# Patient Record
Sex: Male | Born: 1940
Health system: Southern US, Community
[De-identification: ages and names within clinical notes are randomized; demographics above are authoritative.]

## PROBLEM LIST (undated history)

## (undated) DIAGNOSIS — N2581 Secondary hyperparathyroidism of renal origin: Secondary | ICD-10-CM

## (undated) DIAGNOSIS — K649 Unspecified hemorrhoids: Secondary | ICD-10-CM

## (undated) DIAGNOSIS — Z860100 Personal history of colon polyps, unspecified: Secondary | ICD-10-CM

## (undated) DIAGNOSIS — S88119A Complete traumatic amputation at level between knee and ankle, unspecified lower leg, initial encounter: Secondary | ICD-10-CM

## (undated) DIAGNOSIS — R269 Unspecified abnormalities of gait and mobility: Secondary | ICD-10-CM

## (undated) DIAGNOSIS — N400 Enlarged prostate without lower urinary tract symptoms: Secondary | ICD-10-CM

## (undated) DIAGNOSIS — T4145XA Adverse effect of unspecified anesthetic, initial encounter: Secondary | ICD-10-CM

## (undated) DIAGNOSIS — D649 Anemia, unspecified: Secondary | ICD-10-CM

## (undated) DIAGNOSIS — K5900 Constipation, unspecified: Secondary | ICD-10-CM

## (undated) DIAGNOSIS — I96 Gangrene, not elsewhere classified: Secondary | ICD-10-CM

## (undated) DIAGNOSIS — E119 Type 2 diabetes mellitus without complications: Secondary | ICD-10-CM

## (undated) DIAGNOSIS — C61 Malignant neoplasm of prostate: Secondary | ICD-10-CM

## (undated) DIAGNOSIS — M949 Disorder of cartilage, unspecified: Secondary | ICD-10-CM

## (undated) DIAGNOSIS — I255 Ischemic cardiomyopathy: Secondary | ICD-10-CM

## (undated) DIAGNOSIS — T8859XA Other complications of anesthesia, initial encounter: Secondary | ICD-10-CM

## (undated) DIAGNOSIS — I219 Acute myocardial infarction, unspecified: Secondary | ICD-10-CM

## (undated) DIAGNOSIS — N186 End stage renal disease: Secondary | ICD-10-CM

## (undated) DIAGNOSIS — E1142 Type 2 diabetes mellitus with diabetic polyneuropathy: Secondary | ICD-10-CM

## (undated) DIAGNOSIS — I5042 Chronic combined systolic (congestive) and diastolic (congestive) heart failure: Secondary | ICD-10-CM

## (undated) DIAGNOSIS — K6381 Dieulafoy lesion of intestine: Secondary | ICD-10-CM

## (undated) DIAGNOSIS — G709 Myoneural disorder, unspecified: Secondary | ICD-10-CM

## (undated) DIAGNOSIS — I82409 Acute embolism and thrombosis of unspecified deep veins of unspecified lower extremity: Secondary | ICD-10-CM

## (undated) DIAGNOSIS — I214 Non-ST elevation (NSTEMI) myocardial infarction: Secondary | ICD-10-CM

## (undated) DIAGNOSIS — I10 Essential (primary) hypertension: Secondary | ICD-10-CM

## (undated) DIAGNOSIS — N200 Calculus of kidney: Secondary | ICD-10-CM

## (undated) DIAGNOSIS — E785 Hyperlipidemia, unspecified: Secondary | ICD-10-CM

## (undated) DIAGNOSIS — I739 Peripheral vascular disease, unspecified: Secondary | ICD-10-CM

## (undated) DIAGNOSIS — Z992 Dependence on renal dialysis: Secondary | ICD-10-CM

## (undated) DIAGNOSIS — M899 Disorder of bone, unspecified: Secondary | ICD-10-CM

## (undated) DIAGNOSIS — I480 Paroxysmal atrial fibrillation: Secondary | ICD-10-CM

## (undated) DIAGNOSIS — I2510 Atherosclerotic heart disease of native coronary artery without angina pectoris: Secondary | ICD-10-CM

## (undated) DIAGNOSIS — G629 Polyneuropathy, unspecified: Secondary | ICD-10-CM

## (undated) HISTORY — PX: COLONOSCOPY: SHX174

## (undated) HISTORY — DX: Benign prostatic hyperplasia without lower urinary tract symptoms: N40.0

## (undated) HISTORY — DX: Complete traumatic amputation at level between knee and ankle, unspecified lower leg, initial encounter: S88.119A

## (undated) HISTORY — DX: Unspecified abnormalities of gait and mobility: R26.9

## (undated) HISTORY — DX: Disorder of bone, unspecified: M89.9

## (undated) HISTORY — DX: Disorder of cartilage, unspecified: M94.9

---

## 1962-01-11 HISTORY — PX: KNEE CARTILAGE SURGERY: SHX688

## 1999-02-18 ENCOUNTER — Emergency Department (HOSPITAL_COMMUNITY): Admission: EM | Admit: 1999-02-18 | Discharge: 1999-02-18 | Payer: Self-pay | Admitting: Emergency Medicine

## 2000-09-25 ENCOUNTER — Emergency Department (HOSPITAL_COMMUNITY): Admission: EM | Admit: 2000-09-25 | Discharge: 2000-09-25 | Payer: Self-pay | Admitting: Emergency Medicine

## 2001-02-17 ENCOUNTER — Encounter: Payer: Self-pay | Admitting: Emergency Medicine

## 2001-02-17 ENCOUNTER — Emergency Department (HOSPITAL_COMMUNITY): Admission: EM | Admit: 2001-02-17 | Discharge: 2001-02-17 | Payer: Self-pay | Admitting: Emergency Medicine

## 2003-01-12 DIAGNOSIS — I219 Acute myocardial infarction, unspecified: Secondary | ICD-10-CM

## 2003-01-12 HISTORY — PX: CORONARY ARTERY BYPASS GRAFT: SHX141

## 2003-01-12 HISTORY — DX: Acute myocardial infarction, unspecified: I21.9

## 2003-01-12 HISTORY — PX: CAROTID ENDARTERECTOMY: SUR193

## 2004-12-25 ENCOUNTER — Emergency Department (HOSPITAL_COMMUNITY): Admission: EM | Admit: 2004-12-25 | Discharge: 2004-12-25 | Payer: Self-pay | Admitting: Emergency Medicine

## 2005-04-11 ENCOUNTER — Inpatient Hospital Stay (HOSPITAL_COMMUNITY): Admission: EM | Admit: 2005-04-11 | Discharge: 2005-04-22 | Payer: Self-pay | Admitting: Emergency Medicine

## 2005-04-11 ENCOUNTER — Ambulatory Visit: Payer: Self-pay | Admitting: Internal Medicine

## 2005-04-12 ENCOUNTER — Encounter (INDEPENDENT_AMBULATORY_CARE_PROVIDER_SITE_OTHER): Payer: Self-pay | Admitting: Cardiovascular Disease

## 2005-04-12 ENCOUNTER — Encounter: Payer: Self-pay | Admitting: Vascular Surgery

## 2005-04-16 ENCOUNTER — Encounter (INDEPENDENT_AMBULATORY_CARE_PROVIDER_SITE_OTHER): Payer: Self-pay | Admitting: *Deleted

## 2005-05-20 ENCOUNTER — Encounter (HOSPITAL_COMMUNITY): Admission: RE | Admit: 2005-05-20 | Discharge: 2005-08-18 | Payer: Self-pay | Admitting: *Deleted

## 2005-05-28 ENCOUNTER — Encounter: Admission: RE | Admit: 2005-05-28 | Discharge: 2005-05-28 | Payer: Self-pay | Admitting: Cardiothoracic Surgery

## 2005-08-19 ENCOUNTER — Encounter (HOSPITAL_COMMUNITY): Admission: RE | Admit: 2005-08-19 | Discharge: 2005-09-21 | Payer: Self-pay | Admitting: *Deleted

## 2007-01-12 HISTORY — PX: PROSTATECTOMY: SHX69

## 2008-01-12 DIAGNOSIS — C61 Malignant neoplasm of prostate: Secondary | ICD-10-CM

## 2008-01-12 HISTORY — DX: Malignant neoplasm of prostate: C61

## 2009-01-11 HISTORY — PX: CATARACT EXTRACTION W/ INTRAOCULAR LENS  IMPLANT, BILATERAL: SHX1307

## 2009-08-19 ENCOUNTER — Emergency Department (HOSPITAL_COMMUNITY): Admission: EM | Admit: 2009-08-19 | Discharge: 2009-08-19 | Payer: Self-pay | Admitting: Emergency Medicine

## 2009-08-19 ENCOUNTER — Encounter (INDEPENDENT_AMBULATORY_CARE_PROVIDER_SITE_OTHER): Payer: Self-pay | Admitting: Emergency Medicine

## 2009-08-19 ENCOUNTER — Ambulatory Visit: Payer: Self-pay | Admitting: Vascular Surgery

## 2010-02-01 ENCOUNTER — Encounter: Payer: Self-pay | Admitting: Cardiothoracic Surgery

## 2010-03-27 LAB — BASIC METABOLIC PANEL
CO2: 30 mEq/L (ref 19–32)
Calcium: 9.4 mg/dL (ref 8.4–10.5)
Chloride: 99 mEq/L (ref 96–112)
GFR calc non Af Amer: 42 mL/min — ABNORMAL LOW (ref >60)
Glucose, Bld: 356 mg/dL — ABNORMAL HIGH (ref 70–99)
Sodium: 137 mEq/L (ref 135–145)

## 2010-03-27 LAB — GLUCOSE, CAPILLARY: Glucose-Capillary: 335 mg/dL — ABNORMAL HIGH (ref 70–99)

## 2010-03-27 LAB — CBC
MCH: 30.1 pg (ref 26.0–34.0)
RBC: 3.41 MIL/uL — ABNORMAL LOW (ref 4.22–5.81)
WBC: 7.7 10*3/uL (ref 4.0–10.5)

## 2010-05-05 ENCOUNTER — Emergency Department (HOSPITAL_COMMUNITY)
Admission: EM | Admit: 2010-05-05 | Discharge: 2010-05-05 | Disposition: A | Discharge status: Home / Self Care | Payer: Medicare Other | Attending: Emergency Medicine | Admitting: Emergency Medicine

## 2010-05-05 ENCOUNTER — Emergency Department (HOSPITAL_COMMUNITY): Payer: Medicare Other

## 2010-05-05 DIAGNOSIS — E7800 Pure hypercholesterolemia, unspecified: Secondary | ICD-10-CM | POA: Insufficient documentation

## 2010-05-05 DIAGNOSIS — R112 Nausea with vomiting, unspecified: Secondary | ICD-10-CM | POA: Insufficient documentation

## 2010-05-05 DIAGNOSIS — R197 Diarrhea, unspecified: Secondary | ICD-10-CM | POA: Insufficient documentation

## 2010-05-05 DIAGNOSIS — E119 Type 2 diabetes mellitus without complications: Secondary | ICD-10-CM | POA: Insufficient documentation

## 2010-05-05 DIAGNOSIS — I10 Essential (primary) hypertension: Secondary | ICD-10-CM | POA: Insufficient documentation

## 2010-05-05 LAB — URINALYSIS, ROUTINE W REFLEX MICROSCOPIC
Bilirubin Urine: NEGATIVE
Glucose, UA: >1000 mg/dL — AB
Ketones, ur: NEGATIVE mg/dL
Protein, ur: >300 mg/dL — AB
Specific Gravity, Urine: 1.021 (ref 1.005–1.030)
pH: 6 (ref 5.0–8.0)

## 2010-05-05 LAB — CBC
MCH: 30.2 pg (ref 26.0–34.0)
MCHC: 33.5 g/dL (ref 30.0–36.0)
RBC: 4.57 MIL/uL (ref 4.22–5.81)
WBC: 7.4 10*3/uL (ref 4.0–10.5)

## 2010-05-05 LAB — BASIC METABOLIC PANEL
Calcium: 9.6 mg/dL (ref 8.4–10.5)
Chloride: 98 mEq/L (ref 96–112)
Creatinine, Ser: 1.96 mg/dL — ABNORMAL HIGH (ref 0.4–1.5)
GFR calc non Af Amer: 34 mL/min — ABNORMAL LOW (ref >60)

## 2010-05-05 LAB — DIFFERENTIAL
Eosinophils Absolute: 0.1 10*3/uL (ref 0.0–0.7)
Eosinophils Relative: 1 % (ref 0–5)
Lymphocytes Relative: 26 % (ref 12–46)
Lymphs Abs: 1.9 10*3/uL (ref 0.7–4.0)
Monocytes Absolute: 0.5 10*3/uL (ref 0.1–1.0)
Neutro Abs: 4.8 10*3/uL (ref 1.7–7.7)
Neutrophils Relative %: 65 % (ref 43–77)

## 2010-05-29 NOTE — Consult Note (Signed)
Aaron Long, Aaron Long NO.:  [id number]   MEDICAL RECORD NO.:  [id number]          PATIENT TYPE:  INP   LOCATION:  3315                         FACILITY:  [facility]   PHYSICIAN:  Kerin Perna, M.D.  DATE OF BIRTH:  04/12/1940   DATE OF CONSULTATION:  04/12/2005  DATE OF DISCHARGE:                                   CONSULTATION   PHYSICIAN REQUESTING CONSULTATION:  Darlin Priestly, M.D.   PRIMARY PHYSICIAN:  [facility].   REASON FOR CONSULTATION:  Severe three-vessel coronary disease with  subendocardial MI and class IV unstable angina.   CHIEF COMPLAINT:  Chest pain.   HISTORY OF PRESENT ILLNESS:  I was asked to evaluate this 70 year old  African-American male for potential surgical coronary revascularization for  recently diagnosed severe three-vessel coronary artery disease.  The patient  has no history of prior heart disease or cardiac catheterization.  He is  admitted on April 11, 2005 with a four-hour duration of substernal chest pain  radiating to the shoulders not relieved by rest.  It was precipitated by a  large meal and had some associated upper epigastric discomfort, nausea  without vomiting.  The patient was found to have nonspecific EKG changes  when he presented to the emergency department.  He had evidence of left  ventricular hypertrophy.  Cardiac enzymes were checked and noted to be  positive.  His peak CPK-MB was 58 ng/mL.  He was placed on heparin and  scheduled for cardiac catheterization.  This was performed today by Dr.  Lenise Herald.  Left ventriculogram showed EF of approximately 45%.  Left  ventricular end-diastolic pressure was 21 mmHg.  He had severe three-vessel  coronary disease.  The right coronary had a 99% stenosis.  The LAD diagonal  had an 80% stenosis.  The circumflex had a proximal 95% stenosis.  The  patient was not felt to be candidate for percutaneous intervention and  surgical coronary  revascularization was recommended by the patient's  cardiologist.   PAST MEDICAL HISTORY:  1.  Type 2 diabetes mellitus, insulin dependent.  2.  Hypertension.  3.  Hypothyroidism.  4.  Hyperlipidemia.  5.  Elevated creatinine 1.7.   HOME MEDICATIONS:  Aspirin, Lipitor 40 mg, lisinopril 10 mg, Neurontin 300  mg b.i.d., Lantus insulin 50 units q.h.s., Amaryl 8 mg daily.   ALLERGIES:  None.   SOCIAL HISTORY:  The patient is retired from working for the [employer].  He lives in [place] with his wife.  He does  not smoke cigarettes or use alcohol.   FAMILY HISTORY:  Positive for diabetes and hypertension.  Negative for  premature coronary artery disease.  Negative for bypass surgery.   REVIEW OF SYSTEMS:  Constitutional review is negative fever, weight loss.  He is currently 205 pounds which is his stable weight.  HEENT review  indicates he has had total upper dental extractions.  He has no active  dental complaints.  He denies any difficulty swallowing.  Thoracic review is  negative for trauma, abnormal chest x-ray with pulmonary nodule,  a  productive cough or hemoptysis or history of pneumonia.  Cardiac review is  positive for his class IV unstable angina with resting angina and myocardial  infarction.  He has no long history of cardiac murmur, arrhythmia or  myocardial infarction.  GI review is significant for upper abdominal  discomfort on presentation and some abdominal distension noted on his  initial exam.  Ultrasound of the abdomen has been negative and a mildly  elevated amylase has subsequently turned to normal level.  Neurologic is  negative for BPH or hematuria.  Endocrine review is positive thyroid disease  and diabetes mellitus and hematologic review is negative for bleeding  disorder.  Neurologic is negative for stroke or seizure.  Vascular review is  negative DVT, claudication or TIA.   PHYSICAL EXAMINATION:  The patient is 5 foot 9  and weighs 205 pounds.  Blood  pressure 130/60, pulse 70 and sinus, respirations 18, saturation 98% on room  air.  General appearance is that of a middle-aged African-American male in  his hospital room and following cardiac catheterization in no acute  distress.  HEENT exam is normocephalic, full EOMs, dentition adequate.  Neck  is without JVD, mass or carotid bruit.  Lymphatics reveal no palpable  supraclavicular or axillary adenopathy.  His lungs are clear and there is no  thoracic deformity.  Cardiac exam reveals a regular rhythm without S3 gallop  or murmur.  Abdominal exam is nontender but mildly distended.  There is no  organomegaly or pulsatile mass.  Extremities reveal no clubbing, cyanosis or  edema.  Peripheral pulses are 2+ in the upper extremities.  He has a  compression dressing in the right groin.  He has a 1 to 2+ right pedal pulse  and a nonpalpable left pedal pulse.  His neurologic exam is alert and  oriented without focal motor deficit.   LABORATORY DATA:  I reviewed the coronary arteriograms and his medical  records.  He has severe three-vessel coronary disease with mild to moderate  reduction in LV function and significant diabetes with a hemoglobin A1c of  13.1.  His creatinine is elevated at 1.7 and his chest x-ray shows no active  disease.   IMPRESSION/PLAN:  The patient would benefit from surgical revascularization  with bypass grafts to the left anterior descending artery, diagonal, obtuse  marginal, and distal right coronary artery.  I have reviewed the situation  and the proposed operation with the patient and at this point he is willing  to proceed.  We will tentatively schedule his surgery for April 15, 2005 at 7  a.m.  Thank you for the consultation.      Kerin Perna, M.D.  Electronically Signed     PV/MEDQ  D:  04/12/2005  T:  04/12/2005  Job:  [id number]

## 2010-05-29 NOTE — Op Note (Signed)
NAMERIDER, ERMIS NO.:  [id number]   MEDICAL RECORD NO.:  [id number]            PATIENT TYPE:   LOCATION:                                 FACILITY:   PHYSICIAN:  Kerin Perna, M.D.       DATE OF BIRTH:   DATE OF PROCEDURE:  04/15/2005  DATE OF DISCHARGE:                                 OPERATIVE REPORT   OPERATION:  Coronary artery bypass grafting x4 (left internal mammary artery  to LAD, saphenous vein graft to optional diagonal, saphenous vein graft to  the obtuse marginal, saphenous vein graft to distal RCA).   PRE-AND-POSTOPERATIVE DIAGNOSIS:  Class IV unstable angina with severe 3-  vessel coronary artery disease.   SURGEON:  Kerin Perna, M.D.   ASSISTANT:  Constance Holster, PA-C   ANESTHESIA:  General by Kaylyn Layer Michelle Piper, M.D.   INDICATIONS:  The patient is a 70 year old diabetic hypertensive male who  presented with symptoms of unstable angina.  He was also noted to have  mildly positive cardiac enzymes and a cardiology consultation was obtained.  Cardiac catheterization was recommended and this demonstrated severe 3-  vessel coronary artery disease.  The patient was felt to be a candidate for  surgical revascularization.  I examined the patient, on at least 2 occasions  in the hospital room, reviewed the results of his cardiac catheterization  and coronary arteriograms; and discussed the indications, benefits, and  alternatives to surgical revascularization for treatment of his coronary  artery disease.  I reviewed with the patient and his family the main aspects  of the procedure; including the choice of conduit, to include internal  mammary artery and endoscopically harvested saphenous vein; the location of  the surgical incisions; the use of general anesthesia and cardiopulmonary  bypass; and the expected postoperative hospital recovery.  I also discussed,  with the patient, the risks to him of coronary bypass surgery including the  risks  of MI, CVA, bleeding, transfusion requirements, and death.  He  understood these implications for the surgery, and agreed to proceed with  the operation as planned; under what, I felt, was an informed consent.   OPERATIVE FINDINGS:  [name] of coronaries were small consistent with  his diabetic history, but graftable.  The vein was harvested endoscopically  and was of good quality.  The mammary artery was a good vessel with  excellent flow.  The patient required 2 units of blood for anemia on  presentation to the operating room; and platelets and FFP following reversal  of heparin with protamine due to persistent coagulopathy probably related to  his prolonged exposure to preoperative Integrilin.   This patient had a combined CABG, right carotid endarterectomy, on this  date; and the carotid endarterectomy procedure will be dictated in a  separate document by Dr. Waverly Ferrari.   PROCEDURE:  The patient was brought to operating room and placed supine upon  the operating table, and general anesthesia was induced under invasive  hemodynamic monitoring.  The chest, abdomen, and legs were prepped with  Betadine and draped as  a sterile field.  The right neck was prepped and  draped as a sterile field.  The right carotid endarterectomy was performed  by Dr. Edilia Bo, and the neck was loosely reapproximated to be closed at the  completion of the cardiac procedure.   A sternal incision was then made of the saphenous vein which was harvested,  endoscopically, from the right leg.  The left internal mammary artery was  harvested as a pedicle graft from its origin at the subclavian vessels.  It  was a good vessel with excellent flow.  Heparin was administered and the ACT  was documented as being therapeutic.  Aprotinin was not administered for  this operation.   The sternal retractor was placed; and the pericardium was opened and  suspended.  Pursestrings were placed in the ascending  aorta, and the right  atrium and patient was cannulated and placed on bypass.  The coronaries were  identified for grafting; and the cardioplegic cannula was placed on the  ascending aorta.  The patient was cooled to 32 degrees and the aortic  crossclamp was applied.  Then 800 mL of cold blood cardioplegia was  delivered to the aortic root with good cardioplegic arrest and septal  temperature dropping less than 14 degrees.   The distal coronary anastomoses were then performed.  The first distal  anastomosis was to the distal right.  This was a small 1.4-mm vessel with  proximal 80-90% stenosis.  Reverse saphenous vein was sewn end-to-side with  running 7-0 Prolene.  There was good flow through graft.  The second distal  anastomosis was to the optional diagonal.  This was a small 1.4-mm vessel  with proximal calcified 80% stenosis.  A reverse saphenous vein was sewn end-  to-side with a running 7-0 Prolene; and there was a good flow through graft.  The third distal anastomosis was to the obtuse marginal.  This was an  intramyocardial vessel measuring 1.5 mm in diameter.  A reverse saphenous  vein was sewn end-to-side with a running 7-0 Prolene.  There was excellent  flow through graft.  Cardioplegia was redosed.  The fourth distal  anastomosis was the distal third LAD which had diffuse severe proximal  calcified plaque.  The left IMA pedicle was brought through an opening  created; and the left lateral pericardium was brought down onto the LAD and  sewn end-to-side with a running 8-0 Prolene.  There was excellent flow  through the anastomosis after brief release of the pedicle bulldog clamp on  the mammary pedicle.   Cardioplegia was redosed.  While the crossclamp was still in place, 3  proximal vein anastomoses were placed on the ascending aorta using a 4.0-mm  punch and a running 6-0 Prolene.  Prior to tying down the final proximal anastomosis, air was vented from the aorta and the  coronaries and left side  of the heart with a dose of retrograde warm blood cardioplegia.   The heart resumed a spontaneous rhythm.  Temporary pacing wires were placed.  Air was aspirated from the vein grafts with a 27-gauge needle.  The proximal  and distal anastomosis were checked and found to be patent.  When the  patient reached 37 degrees.  The lungs were re-expanded; and the ventilator  was resumed.  The patient was then weaned from bypass without difficulty,  without inotropes.  Blood pressure and cardiac output were 4 liters per  minute and the normal blood pressure.   Protamine was administered without adverse reaction;  and the cannula was  removed.  The mediastinum was irrigated with warm antibiotic irrigation.  The leg incision was irrigated and closed in a standard fashion.  The  superior pericardial fat was closed over the aorta.  Two mediastinal and a  left pleural chest tube were placed and brought out through separate  incisions.  The sternum was then closed with interrupted steel wire.  The  pectoralis fascia was closed with a running #1 Vicryl.  The subcutaneous and  skin layers were closed with a  running Vicryl as well.  Total bypass time  was 140 minutes with crossclamp time of 100 minutes.      Kerin Perna, M.D.  Electronically Signed    PV/MEDQ  D:  04/15/2005  T:  04/16/2005  Job:  [id number]

## 2010-05-29 NOTE — Cardiovascular Report (Signed)
Aaron Long, STREGE NO.:  [id number]   MEDICAL RECORD NO.:  [id number]          PATIENT TYPE:  INP   LOCATION:  2003                         FACILITY:  [facility]   PHYSICIAN:  Darlin Priestly, MD  DATE OF BIRTH:  [birth year]   DATE OF PROCEDURE:  04/12/2005  DATE OF DISCHARGE:                              CARDIAC CATHETERIZATION   PROCEDURE:  1.  Left heart catheterization.  2.  Coronary angiogram.  3.  Left ventriculogram.   ATTENDING:  Darlin Priestly, MD   COMPLICATIONS:  None.   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  He was  subsequently noted to have lateral T wave inversions.  We did ultimately see  him in consult and began him on IV nitro as well as IV heparin.  He  subsequently ruled in for a non-Q-wave MI.  He does also have a history of  chronic renal insufficiency.  He is now brought for cardiac catheterization  with limited contrast to evaluate his CAD.   DESCRIPTION OF PROCEDURE:  After informed consent, the patient was brought  to the cardiac cath lab where the right leg and groin was shaved, prepped  and draped in the usual sterile fashion.  ECG monitor was established.  Using a modified Seldinger technique, a #6 French arterial sheath was  inserted in the right femoral artery.  A #6 French diagnostic  catheterization was then used to perform diagnostic angiography.   The left main is noted to be a medium sized vessel which is calcified with  30% disease.  There is no __________.   The LAD is a large vessel which courses to the apex and gives off two  diagonal branches.  There is diffuse calcification throughout the  proximal  mid portion of the LAD.  There is 40% proximal LAD disease as well as  diffuse 60% disease extending across the first diagonal.  There is some  haziness noted to the mid portion of the LAD.  There is a 70% lesion in the  apex of the LAD.   The first diagonal is a small vessel which bifurcates in its mid segment.  No evidence of disease.  The second diagonal is a medium sized vessel with  no evidence of disease.   The left circumflex is a medium sized vessel that courses to the AV groove  and gives rise to three obtuse marginal branches.  The AV circumflex is also  noted to be calcified in its proximal portion.  There is a 50% early mid and  a 70% distal AV groove lesion.   The OM is a medium sized vessel which has a 95% ostial lesion.   The second OM is a medium sized vessel which bifurcates distally with  diffuse 70% proximal lesion.   The third OM is  a small to medium sized vessel with no evidence of disease.   The right coronary artery is a medium to large sized vessel which is  dominant and which gives rise to both the PDA and posterolateral branch.  There is calcification noted in the mid portion of the RCA with diffuse 99%  stenosis throughout the mid portion with a 70% distal lesion.  The PDA and  posterolateral branch have no evidence of disease.   Hand injection of the LV reveals an EF of approximately 40% with posterior  basilar and inferior hypokinesis.   HEMODYNAMICS:  Systemic arterial pressure 144/72.  LV systemic pressure  141/12.  LVEDP 21.   CONCLUSION:  1.  Significant three vessel coronary artery disease.  2.  Mild-to-moderate left ventricular systolic function.  3.  Elevated left ventricular end-diastolic pressure.      Darlin Priestly, MD  Electronically Signed     RHM/MEDQ  D:  04/12/2005  T:  04/13/2005  Job:  [id number]   cc:   Madaline Guthrie, M.D.  Fax: [phone]

## 2010-05-29 NOTE — Op Note (Signed)
NAMEZADEN, SAKO NO.:  [id number]   MEDICAL RECORD NO.:  [id number]          PATIENT TYPE:  INP   LOCATION:  2304                         FACILITY:  [facility]   PHYSICIAN:  Di Kindle. Edilia Bo, M.D.DATE OF BIRTH:  16-Mar-1940   DATE OF PROCEDURE:  04/15/2005  DATE OF DISCHARGE:                                 OPERATIVE REPORT   PREOPERATIVE DIAGNOSIS:  Asymptomatic greater than 80% right carotid  stenosis with symptomatic coronary artery disease.   POSTOPERATIVE DIAGNOSIS:  Asymptomatic greater than 80% right carotid  stenosis with symptomatic coronary artery disease.   PROCEDURES:  1.  Right carotid endarterectomy with Dacron patch angioplasty.  2.  Coronary bypass surgery is dictated separately by Dr. Donata Clay.   SURGEON:  Di Kindle. Edilia Bo, M.D.   ASSISTANT:  Jerold Coombe, P.A.   ANESTHESIA:  General.   INDICATIONS:  This is a 70 year old gentleman with multivessel coronary  disease who is scheduled for elective coronary revascularization.  Preoperative duplex scan showed a tight greater than 80%  right carotid  stenosis and combined right carotid endarterectomy along with his CABG was  recommended in order to lower his risk of perioperative and future stroke.  The procedure, potential complications including but not limited to  bleeding, stroke (1-2% periprocedural risk), MI, nerve injury, or other  unpredictable medical problems were discussed with the patient  preoperatively.  All his questions were answered.  He was agreeable to  proceed.   TECHNIQUE:  The patient taken to the operating room after the arterial and  Swan-Ganz catheter had been placed by anesthesia.  The patient received a  general anesthetic.  The neck and chest and lower extremities were prepped  and draped in usual sterile fashion for combined right carotid  endarterectomy and CABG.  A longitudinal incision was made along the  anterior border of the  sternocleidomastoid and dissection carried down to  the common carotid artery which was dissected free and controlled with a  Rumel tourniquet.  The facial vein was divided between 2-0 silk ties and the  internal carotid artery above the plaque was controlled with a blue vessel  loop.  The superior thyroid artery was controlled and the external carotid  artery was controlled.  The patient was then heparinized.  Clamps were then  placed on the internal then the external then the common carotid artery.  Longitudinal arteriotomy was made in the common carotid artery and this was  extended through the plaque into the internal carotid artery.  A 12 shunt  was placed into the internal carotid artery, back bled and placed into the  common carotid artery and secured with a Rumel tourniquet.  Flow was  reestablished to the shunt.  An endarterectomy plane was established  proximally.  The plaque was sharply divided.  Eversion endarterectomy was  performed of the external carotid artery.  Distally there was a nice taper  in the plaque.  Two tacking sutures were placed.  The artery was irrigated  with copious amounts of heparin and Dextran and all loose debris removed.  The Dacron patch was then sewn  using continuous 6-0 Prolene suture.  Prior  to completing the patch closure, the arteries were back bled and flushed  appropriately and anastomosis completed.  Flow was reestablished first to  the external carotid artery and then to the internal carotid artery.  At  completion there was a good pulse distal to the patch and good Doppler flow.  Hemostasis was obtained.  The wound was closed loosely to be closed at the  completion of the coronary bypass surgery.  At this point the procedure the  needle and sponge counts were correct.      Di Kindle. Edilia Bo, M.D.  Electronically Signed     CSD/MEDQ  D:  04/15/2005  T:  04/16/2005  Job:  [id number]

## 2010-05-29 NOTE — Discharge Summary (Signed)
Aaron Long, HOSIER NO.:  [id number]   MEDICAL RECORD NO.:  [id number]          PATIENT TYPE:  INP   LOCATION:  2010                         FACILITY:  [facility]   PHYSICIAN:  Kerin Perna, M.D.  DATE OF BIRTH:  [birth year]   DATE OF ADMISSION:  04/11/2005  DATE OF DISCHARGE:  04/22/2005                                 DISCHARGE SUMMARY   PRIMARY ADMISSION DIAGNOSIS:  Chest pain.   ADDITIONAL/DISCHARGE DIAGNOSES:  1.  Severe 3 vessel coronary artery disease.  2.  Sub-endocardial myocardial infarction.  3.  Asymptomatic right carotid stenosis.  4.  Type 2 diabetes mellitus.  5.  Hypertension.  6.  Hyperlipidemia.  7.  Chronic renal insufficiency, baseline creatinine 1.7.  8.  Postoperative anemia.   PROCEDURES:  1.  Cardiac catheterization.  2.  Coronary artery bypass grafting x4 (left internal mammary artery to the      left anterior descending, saphenous vein graft to the optional diagonal,      saphenous vein graft to the obtuse marginal, saphenous vein graft to the      distal right coronary artery).  3.  Endoscopic vein harvest, right leg.  4.  Right carotid endarterectomy with Dacron patch angioplasty.   HISTORY OF PRESENT ILLNESS:  The patient is a 70 year old African-American  male who presented on the day of this admission complaining of substernal  chest pain, which had been going on for approximately 4 hours and radiating  to his shoulders. It was precipitated by a large meal and had some  associated nausea and upper epigastric discomfort. He was found to have non-  specific EKG changes on presentation to the emergency department, as well as  evidence of left ventricular hypertrophy. Because of this, he was admitted  for further evaluation and treatment.   HOSPITAL COURSE:  The patient was admitted from the emergency department by  the medical teaching service. His cardiac enzymes were positive with a peak  CK-MB of 68. He was subsequently  seen in consultation by [facility]. He underwent cardiac catheterization on April 12, 2005, which  showed an ejection fraction of approximately 45% with severe 3 vessel  coronary artery disease. He was not felt to be a candidate for percutaneous  intervention and therefore, a surgical consultation was obtained. Dr. Kathlee Nations Trigt saw the patient and reviewed his films and agreed that his best  course of action would be to proceed with surgical re-vascularization at  this time. He explained the risks, benefits, and alternatives of surgery to  the patient and he agrees to proceed. During the course of his preoperative  workup, he underwent carotid Doppler studies, which showed a greater than  80% right internal carotid artery stenosis with 60% to 80% stenosis on the  left. Because of the significant disease of his right carotid, a vascular  surgery consultation was obtained. Dr. Tawanna Cooler Early initially saw the patient  and felt that in order to decrease his risk of stroke both peri-operatively  and postoperative, a combined coronary artery bypass grafting carotid  procedure was recommended. Because of surgery  scheduling issues, Dr. Arbie Cookey  was unable to perform the procedure but he was scheduled for combined  procedure with Dr. Waverly Ferrari, who also saw him preoperatively and  discussed the surgery with him.   He was ultimately taken to the operating room on April 15, 2005 and underwent  coronary artery bypass grafting x4 and right carotid endarterectomy as  described in detail above. He tolerated the procedure well and was  transferred to the Surgical Intensive Care Unit in stable condition. He was  coagulopathic peri-operatively and required transfusion of platelets and  FFP. He also received 2 units of packed red blood cells peri-operatively for  the anemia. He remained hemodynamically stable. Transferred to the unit and  was able to be extubated shortly after surgery.  He was stable and doing well  on postoperative day, although he remained anemic, requiring 2 additional  units of packed red blood cells. He was also very volume overloaded and was  started on Lasix. He remained in the unit for further evaluation. He  developed acute on chronic renal insufficiency with peak creatinine of 2.0.  He was managed conservatively and his creatinine stabilized and trended  downward. He also developed a temperature of 101.6 with a productive cough  and sputum production. He was started on ciprofloxacin empirically for  presumed bronchitis. He also underwent a urinalysis and urine culture and  these are both negative. He was started on a beta blocker once his blood  pressure could tolerate this. He is slowly mobilized with cardiac  rehabilitation, phase 1. On postoperative day 4, he is able to be  transferred to the floor. Overall, he has done very well postoperatively. He  continues to maintain normal sinus rhythm. His blood pressure have been  trending upward and since his creatinine has stabilized at its baseline, he  has been restarted on an ace inhibitor as well. He is back on his home doses  of his diabetes medications and his blood sugars remain stable. He has had  no more fevers. He has been weaned from supplemental oxygen and his O2  saturations have been greater than 90% on room air. He is still somewhat  volume overloaded and has moderate amount of peripheral edema on physical  examination. His lungs are clear and he has remained completely  neurologically intact throughout his admission. His pulmonary status has  improved significantly and by the day of discharge, he will have completed a  5 day course of antibiotics. He will have repeat labs on April 22, 2005 to  recheck his renal function. His most recent labs performed on April 20, 2005 show a hemoglobin 8.6 and hematocrit of 25.6, for which he has been started  on iron supplementation. Also, white  count of 9.1, platelets 281,000, sodium  136, potassium 4.1, BUN 24, creatinine 1.6. His blood sugars have been  fairly well controlled on his home medications, although his admission  hemoglobin A1C was 13. __________ examined the patient on April 22, 2005,  showing sub-optimal preoperative control. This will need to be followed up  by his primary care physician as an outpatient. Also, preoperative lipid  panel showed a total cholesterol of 163, triglycerides 156, HDL 27, LDL 105,  VLDL 31. He is tolerating a regular diet and is having normal bowel and  bladder function. It is anticipated that if he remains stable over the next  24 hours, his lab work is stable or improving, and no other acute changes  have occurred, he will  hopefully be ready for discharge home on April 22, 2005.   DISCHARGE MEDICATIONS:  1.  Enteric coated aspirin 81 mg daily  2.  Lopressor 25 mg b.i.d.  3.  Lisinopril 10 mg daily.  4.  Lipitor 40 mg daily.  5.  Nu-Iron 150 mg daily.  6.  Lasix 40 mg daily x1 week.  7.  K-Dur 20 meq daily x1 week.  8.  Amaryl 8 mg daily.  9.  Lantus 50 units daily.  10. Neurontin 300 mg daily.  11. Tylox 1 to 2 q.4-6 h. p.r.n. for pain.   ACTIVITY:  He is asked to refrain from driving, heavy lifting, or strenuous  activity. He may continue to ambulate daily and using his incentive  spirometer.   WOUND CARE:  He may shower daily and clean his incisions with soap and  water.   DIET:  He will continue a low-fat, low-sodium, carbohydrate modified diet.   FOLLOWUP:  1.  He will need to make an appointment to see Dr. Jenne Campus in 2 weeks.  2.  He will follow up with Dr. Donata Clay on May 21, 2005 at 10:30 a.m.  3.  He will have a chest x-ray at [facility], 1 hour prior to      this appointment and should bring his films for Dr. Donata Clay to review.  4.  He will also need to follow up with his primary care physician for      recheck of his blood sugars.      Coral Ceo, P.A.      Kerin Perna, M.D.  Electronically Signed    GC/MEDQ  D:  04/21/2005  T:  04/21/2005  Job:  [id number]   cc:   Darlin Priestly, MD  Fax: [phone]   Dr. Bretta Bang, N.C.

## 2010-11-04 ENCOUNTER — Encounter: Payer: Self-pay | Admitting: Cardiovascular Disease

## 2010-11-25 ENCOUNTER — Encounter: Payer: Self-pay | Admitting: Emergency Medicine

## 2010-11-25 ENCOUNTER — Emergency Department (HOSPITAL_COMMUNITY): Payer: Medicare Other

## 2010-11-25 ENCOUNTER — Emergency Department (HOSPITAL_COMMUNITY)
Admission: EM | Admit: 2010-11-25 | Discharge: 2010-11-25 | Disposition: A | Discharge status: Home / Self Care | Payer: Medicare Other | Attending: Emergency Medicine | Admitting: Emergency Medicine

## 2010-11-25 DIAGNOSIS — R509 Fever, unspecified: Secondary | ICD-10-CM | POA: Insufficient documentation

## 2010-11-25 DIAGNOSIS — E119 Type 2 diabetes mellitus without complications: Secondary | ICD-10-CM | POA: Insufficient documentation

## 2010-11-25 DIAGNOSIS — R05 Cough: Secondary | ICD-10-CM

## 2010-11-25 DIAGNOSIS — I25810 Atherosclerosis of coronary artery bypass graft(s) without angina pectoris: Secondary | ICD-10-CM | POA: Insufficient documentation

## 2010-11-25 DIAGNOSIS — R059 Cough, unspecified: Secondary | ICD-10-CM | POA: Insufficient documentation

## 2010-11-25 DIAGNOSIS — Z794 Long term (current) use of insulin: Secondary | ICD-10-CM | POA: Insufficient documentation

## 2010-11-25 DIAGNOSIS — Z8546 Personal history of malignant neoplasm of prostate: Secondary | ICD-10-CM | POA: Insufficient documentation

## 2010-11-25 HISTORY — DX: Atherosclerotic heart disease of native coronary artery without angina pectoris: I25.10

## 2010-11-25 HISTORY — DX: Malignant neoplasm of prostate: C61

## 2010-11-25 MED ORDER — HYDROCOD POLST-CHLORPHEN POLST 10-8 MG/5ML PO LQCR: 5.0000 mL | Freq: Two times a day (BID) | ORAL | Status: DC

## 2010-11-25 MED ORDER — AZITHROMYCIN 250 MG PO TABS
500.0000 mg | ORAL_TABLET | Freq: Once | ORAL | Status: AC
  Administered 2010-11-25: 500 mg via ORAL
  Filled 2010-11-25: qty 2

## 2010-11-25 MED ORDER — AZITHROMYCIN 250 MG PO TABS: 250.0000 mg | ORAL_TABLET | Freq: Every day | ORAL | Status: DC

## 2010-11-25 NOTE — ED Notes (Signed)
Pt c/o of dry cough and fever. Pt has had nausea and vomiting but no diarrhea.

## 2010-11-25 NOTE — ED Provider Notes (Signed)
History     CSN: [id number] Arrival date & time: 11/25/2010 10:27 AM   First MD Initiated Contact with Patient 11/25/10 1313      Chief Complaint  Patient presents with  . Cough    (Consider location/radiation/quality/duration/timing/severity/associated sxs/prior treatment) Patient is a 70 y.o. male presenting with cough. The history is provided by the patient.  Cough This is a new problem. The current episode started yesterday. The problem occurs hourly. The problem has not changed since onset.The cough is non-productive. The maximum temperature recorded prior to his arrival was 100 to 100.9 F. The fever has been present for 1 to 2 days. Associated symptoms include chills, sweats and rhinorrhea. Pertinent negatives include no chest pain and no weight loss. He is not a smoker. His past medical history is significant for asthma. His past medical history does not include COPD.    Past Medical History  Diagnosis Date  . Diabetes mellitus   . Prostate cancer   . Coronary artery disease     Past Surgical History  Procedure Date  . Cardiac surgery     No family history on file.  History  Substance Use Topics  . Smoking status: Former Games developer  . Smokeless tobacco: Not on file  . Alcohol Use:       Review of Systems  Constitutional: Positive for chills. Negative for weight loss.  HENT: Positive for congestion and rhinorrhea.   Eyes: Negative.   Respiratory: Positive for cough.   Cardiovascular: Negative for chest pain.  Genitourinary: Negative.   Musculoskeletal: Negative.   Neurological: Negative.   Psychiatric/Behavioral: Negative.     Allergies  Review of patient's allergies indicates no known allergies.  Home Medications   Current Outpatient Rx  Name Route Sig Dispense Refill  . ASPIRIN EC 81 MG PO TBEC Oral Take 81 mg by mouth daily.      [name] VITAMIN D 1000 UNITS PO TABS Oral Take 1,000 Units by mouth daily.      . INSULIN DETEMIR 100 UNIT/ML [place] SOLN  Subcutaneous Inject 50 Units into the skin at bedtime.      [name] LISINOPRIL 10 MG PO TABS Oral Take 10 mg by mouth daily.      [name] METOPROLOL TARTRATE 25 MG PO TABS Oral Take 25 mg by mouth daily.      Carma Leaven M PLUS PO TABS Oral Take 1 tablet by mouth daily.      [name] PRAVASTATIN SODIUM 80 MG PO TABS Oral Take 80 mg by mouth daily.        BP 136/57  Pulse 92  Temp(Src) 100.6 F (38.1 C) (Oral)  Resp 18  SpO2 97%  Physical Exam  Nursing note and vitals reviewed. Constitutional: He is oriented to person, place, and time. He appears well-developed and well-nourished. No distress.  HENT:  Head: Normocephalic and atraumatic.  Eyes: Pupils are equal, round, and reactive to light.  Neck: Normal range of motion.  Cardiovascular: Normal rate and intact distal pulses.   Pulmonary/Chest: Effort normal. No respiratory distress. He has no wheezes. He has no rales.  Abdominal: Normal appearance. He exhibits no distension.  Musculoskeletal: Normal range of motion.  Neurological: He is alert and oriented to person, place, and time. No cranial nerve deficit.  Skin: Skin is warm and dry. No rash noted.  Psychiatric: He has a normal mood and affect. His behavior is normal.    ED Course  Procedures (including critical care time)  Labs Reviewed - No data  to display Dg Chest 2 View  11/25/2010  *RADIOLOGY REPORT*  Clinical Data: Cough, short of breath, fever  CHEST - 2 VIEW  Comparison: Chest x-ray of 05/28/2005  Findings: No active infiltrate or effusion is seen. Mediastinal contours are stable.  The heart is within upper limits of normal. Median sternotomy sutures are noted from prior CABG.  No bony abnormality is seen.  IMPRESSION: No active lung disease.  Original Report Authenticated By: Juline Patch, M.D.     No diagnosis found.    MDM  Will give by mouth azithromycin in the emergency department with prescription for five-day course along with a cough syrup        Nelia Shi,  MD 11/25/10 1320

## 2011-01-13 DIAGNOSIS — N189 Chronic kidney disease, unspecified: Secondary | ICD-10-CM | POA: Diagnosis not present

## 2011-01-13 DIAGNOSIS — G2581 Restless legs syndrome: Secondary | ICD-10-CM | POA: Diagnosis not present

## 2011-01-13 DIAGNOSIS — I10 Essential (primary) hypertension: Secondary | ICD-10-CM | POA: Diagnosis not present

## 2011-01-13 DIAGNOSIS — E785 Hyperlipidemia, unspecified: Secondary | ICD-10-CM | POA: Diagnosis not present

## 2011-01-13 DIAGNOSIS — E1149 Type 2 diabetes mellitus with other diabetic neurological complication: Secondary | ICD-10-CM | POA: Diagnosis not present

## 2011-01-13 DIAGNOSIS — Z79899 Other long term (current) drug therapy: Secondary | ICD-10-CM | POA: Diagnosis not present

## 2011-03-04 DIAGNOSIS — H04129 Dry eye syndrome of unspecified lacrimal gland: Secondary | ICD-10-CM | POA: Diagnosis not present

## 2011-03-04 DIAGNOSIS — E11329 Type 2 diabetes mellitus with mild nonproliferative diabetic retinopathy without macular edema: Secondary | ICD-10-CM | POA: Diagnosis not present

## 2011-03-04 DIAGNOSIS — H2700 Aphakia, unspecified eye: Secondary | ICD-10-CM | POA: Diagnosis not present

## 2011-04-06 DIAGNOSIS — E785 Hyperlipidemia, unspecified: Secondary | ICD-10-CM | POA: Diagnosis not present

## 2011-04-06 DIAGNOSIS — E1149 Type 2 diabetes mellitus with other diabetic neurological complication: Secondary | ICD-10-CM | POA: Diagnosis not present

## 2011-04-06 DIAGNOSIS — I798 Other disorders of arteries, arterioles and capillaries in diseases classified elsewhere: Secondary | ICD-10-CM | POA: Diagnosis not present

## 2011-04-06 DIAGNOSIS — I10 Essential (primary) hypertension: Secondary | ICD-10-CM | POA: Diagnosis not present

## 2011-04-06 DIAGNOSIS — E1159 Type 2 diabetes mellitus with other circulatory complications: Secondary | ICD-10-CM | POA: Diagnosis not present

## 2011-04-06 DIAGNOSIS — E1142 Type 2 diabetes mellitus with diabetic polyneuropathy: Secondary | ICD-10-CM | POA: Diagnosis not present

## 2011-04-27 DIAGNOSIS — I10 Essential (primary) hypertension: Secondary | ICD-10-CM | POA: Diagnosis not present

## 2011-04-27 DIAGNOSIS — E785 Hyperlipidemia, unspecified: Secondary | ICD-10-CM | POA: Diagnosis not present

## 2011-04-27 DIAGNOSIS — R82998 Other abnormal findings in urine: Secondary | ICD-10-CM | POA: Diagnosis not present

## 2011-04-28 ENCOUNTER — Encounter (HOSPITAL_COMMUNITY): Payer: Self-pay | Admitting: Emergency Medicine

## 2011-04-28 ENCOUNTER — Inpatient Hospital Stay (HOSPITAL_COMMUNITY)
Admission: EM | Admit: 2011-04-28 | Discharge: 2011-05-06 | DRG: 074 | Disposition: A | Discharge status: Home / Self Care | Payer: Medicare Other | Attending: Internal Medicine | Admitting: Internal Medicine

## 2011-04-28 ENCOUNTER — Emergency Department (HOSPITAL_COMMUNITY): Payer: Medicare Other

## 2011-04-28 DIAGNOSIS — L89509 Pressure ulcer of unspecified ankle, unspecified stage: Secondary | ICD-10-CM | POA: Diagnosis not present

## 2011-04-28 DIAGNOSIS — E1159 Type 2 diabetes mellitus with other circulatory complications: Secondary | ICD-10-CM | POA: Diagnosis present

## 2011-04-28 DIAGNOSIS — I129 Hypertensive chronic kidney disease with stage 1 through stage 4 chronic kidney disease, or unspecified chronic kidney disease: Secondary | ICD-10-CM | POA: Diagnosis present

## 2011-04-28 DIAGNOSIS — N289 Disorder of kidney and ureter, unspecified: Secondary | ICD-10-CM | POA: Diagnosis not present

## 2011-04-28 DIAGNOSIS — IMO0002 Reserved for concepts with insufficient information to code with codable children: Secondary | ICD-10-CM | POA: Diagnosis present

## 2011-04-28 DIAGNOSIS — R739 Hyperglycemia, unspecified: Secondary | ICD-10-CM

## 2011-04-28 DIAGNOSIS — I10 Essential (primary) hypertension: Secondary | ICD-10-CM | POA: Diagnosis not present

## 2011-04-28 DIAGNOSIS — E1142 Type 2 diabetes mellitus with diabetic polyneuropathy: Secondary | ICD-10-CM | POA: Diagnosis present

## 2011-04-28 DIAGNOSIS — E1352 Other specified diabetes mellitus with diabetic peripheral angiopathy with gangrene: Secondary | ICD-10-CM | POA: Diagnosis present

## 2011-04-28 DIAGNOSIS — E1149 Type 2 diabetes mellitus with other diabetic neurological complication: Principal | ICD-10-CM | POA: Diagnosis present

## 2011-04-28 DIAGNOSIS — E1169 Type 2 diabetes mellitus with other specified complication: Secondary | ICD-10-CM | POA: Diagnosis not present

## 2011-04-28 DIAGNOSIS — E118 Type 2 diabetes mellitus with unspecified complications: Secondary | ICD-10-CM | POA: Diagnosis present

## 2011-04-28 DIAGNOSIS — Z794 Long term (current) use of insulin: Secondary | ICD-10-CM

## 2011-04-28 DIAGNOSIS — L97509 Non-pressure chronic ulcer of other part of unspecified foot with unspecified severity: Secondary | ICD-10-CM | POA: Diagnosis present

## 2011-04-28 DIAGNOSIS — I739 Peripheral vascular disease, unspecified: Secondary | ICD-10-CM

## 2011-04-28 DIAGNOSIS — Z8546 Personal history of malignant neoplasm of prostate: Secondary | ICD-10-CM

## 2011-04-28 DIAGNOSIS — E1165 Type 2 diabetes mellitus with hyperglycemia: Secondary | ICD-10-CM | POA: Diagnosis present

## 2011-04-28 DIAGNOSIS — L0390 Cellulitis, unspecified: Secondary | ICD-10-CM | POA: Diagnosis present

## 2011-04-28 DIAGNOSIS — I96 Gangrene, not elsewhere classified: Secondary | ICD-10-CM | POA: Diagnosis present

## 2011-04-28 DIAGNOSIS — I2510 Atherosclerotic heart disease of native coronary artery without angina pectoris: Secondary | ICD-10-CM | POA: Insufficient documentation

## 2011-04-28 DIAGNOSIS — N179 Acute kidney failure, unspecified: Secondary | ICD-10-CM

## 2011-04-28 DIAGNOSIS — L03039 Cellulitis of unspecified toe: Secondary | ICD-10-CM | POA: Diagnosis present

## 2011-04-28 DIAGNOSIS — D649 Anemia, unspecified: Secondary | ICD-10-CM | POA: Diagnosis present

## 2011-04-28 DIAGNOSIS — C61 Malignant neoplasm of prostate: Secondary | ICD-10-CM | POA: Insufficient documentation

## 2011-04-28 DIAGNOSIS — L02619 Cutaneous abscess of unspecified foot: Secondary | ICD-10-CM | POA: Diagnosis present

## 2011-04-28 DIAGNOSIS — N184 Chronic kidney disease, stage 4 (severe): Secondary | ICD-10-CM | POA: Diagnosis present

## 2011-04-28 DIAGNOSIS — M949 Disorder of cartilage, unspecified: Secondary | ICD-10-CM | POA: Diagnosis not present

## 2011-04-28 DIAGNOSIS — L03116 Cellulitis of left lower limb: Secondary | ICD-10-CM

## 2011-04-28 DIAGNOSIS — M899 Disorder of bone, unspecified: Secondary | ICD-10-CM | POA: Diagnosis not present

## 2011-04-28 DIAGNOSIS — M7989 Other specified soft tissue disorders: Secondary | ICD-10-CM | POA: Diagnosis not present

## 2011-04-28 DIAGNOSIS — E11621 Type 2 diabetes mellitus with foot ulcer: Secondary | ICD-10-CM | POA: Diagnosis present

## 2011-04-28 LAB — COMPREHENSIVE METABOLIC PANEL
ALT: 13 U/L (ref 0–53)
AST: 20 U/L (ref 0–37)
BUN: 23 mg/dL (ref 6–23)
CO2: 28 mEq/L (ref 19–32)
Glucose, Bld: 171 mg/dL — ABNORMAL HIGH (ref 70–99)
Potassium: 5.1 mEq/L (ref 3.5–5.1)
Total Protein: 7.3 g/dL (ref 6.0–8.3)

## 2011-04-28 LAB — DIFFERENTIAL
Eosinophils Absolute: 0.1 10*3/uL (ref 0.0–0.7)
Lymphs Abs: 2.3 10*3/uL (ref 0.7–4.0)
Monocytes Relative: 8 % (ref 3–12)
Neutrophils Relative %: 64 % (ref 43–77)

## 2011-04-28 LAB — CBC
Hemoglobin: 12.5 g/dL — ABNORMAL LOW (ref 13.0–17.0)
MCH: 30.5 pg (ref 26.0–34.0)
MCV: 93.4 fL (ref 78.0–100.0)
RBC: 4.1 MIL/uL — ABNORMAL LOW (ref 4.22–5.81)

## 2011-04-28 MED ORDER — LISINOPRIL 10 MG PO TABS
10.0000 mg | ORAL_TABLET | Freq: Every day | ORAL | Status: DC
  Administered 2011-04-29 – 2011-05-01 (×3): 10 mg via ORAL
  Filled 2011-04-28 (×5): qty 1

## 2011-04-28 MED ORDER — SODIUM CHLORIDE 0.9 % IJ SOLN
3.0000 mL | Freq: Two times a day (BID) | INTRAMUSCULAR | Status: DC
  Administered 2011-04-29: 3 mL via INTRAVENOUS
  Administered 2011-04-30: 10 mL via INTRAVENOUS
  Administered 2011-05-02 – 2011-05-03 (×2): 3 mL via INTRAVENOUS

## 2011-04-28 MED ORDER — INSULIN ASPART 100 UNIT/ML ~~LOC~~ SOLN
0.0000 [IU] | Freq: Every day | SUBCUTANEOUS | Status: DC
  Administered 2011-04-28: 3 [IU] via SUBCUTANEOUS
  Administered 2011-04-30: 2 [IU] via SUBCUTANEOUS

## 2011-04-28 MED ORDER — VANCOMYCIN HCL IN DEXTROSE 1-5 GM/200ML-% IV SOLN
1000.0000 mg | Freq: Once | INTRAVENOUS | Status: AC
  Administered 2011-04-28: 1000 mg via INTRAVENOUS
  Filled 2011-04-28: qty 200

## 2011-04-28 MED ORDER — METOPROLOL TARTRATE 25 MG PO TABS
25.0000 mg | ORAL_TABLET | Freq: Two times a day (BID) | ORAL | Status: DC
  Administered 2011-04-28 – 2011-05-04 (×12): 25 mg via ORAL
  Filled 2011-04-28 (×16): qty 1

## 2011-04-28 MED ORDER — SIMVASTATIN 40 MG PO TABS
40.0000 mg | ORAL_TABLET | Freq: Every day | ORAL | Status: DC
  Administered 2011-04-29 – 2011-05-01 (×3): 40 mg via ORAL
  Filled 2011-04-28 (×4): qty 1

## 2011-04-28 MED ORDER — LISINOPRIL 10 MG PO TABS
10.0000 mg | ORAL_TABLET | Freq: Once | ORAL | Status: DC
  Filled 2011-04-28: qty 1

## 2011-04-28 MED ORDER — ENOXAPARIN SODIUM 40 MG/0.4ML ~~LOC~~ SOLN
40.0000 mg | SUBCUTANEOUS | Status: DC
  Administered 2011-04-28 – 2011-05-01 (×4): 40 mg via SUBCUTANEOUS
  Filled 2011-04-28 (×6): qty 0.4

## 2011-04-28 MED ORDER — PIPERACILLIN-TAZOBACTAM 3.375 G IVPB
3.3750 g | Freq: Three times a day (TID) | INTRAVENOUS | Status: DC
  Administered 2011-04-28 – 2011-05-02 (×13): 3.375 g via INTRAVENOUS
  Filled 2011-04-28 (×19): qty 50

## 2011-04-28 MED ORDER — VANCOMYCIN HCL IN DEXTROSE 1-5 GM/200ML-% IV SOLN
1000.0000 mg | INTRAVENOUS | Status: DC
  Administered 2011-04-29 – 2011-05-02 (×4): 1000 mg via INTRAVENOUS
  Filled 2011-04-28 (×5): qty 200

## 2011-04-28 MED ORDER — SODIUM CHLORIDE 0.9 % IV SOLN: 250.0000 mL | INTRAVENOUS | Status: DC | PRN

## 2011-04-28 MED ORDER — INSULIN DETEMIR 100 UNIT/ML ~~LOC~~ SOLN
26.0000 [IU] | Freq: Two times a day (BID) | SUBCUTANEOUS | Status: DC
  Administered 2011-04-28 – 2011-05-03 (×11): 26 [IU] via SUBCUTANEOUS
  Filled 2011-04-28: qty 10

## 2011-04-28 MED ORDER — SODIUM CHLORIDE 0.9 % IJ SOLN: 3.0000 mL | INTRAMUSCULAR | Status: DC | PRN

## 2011-04-28 MED ORDER — SODIUM CHLORIDE 0.9 % IV SOLN
INTRAVENOUS | Status: DC
  Administered 2011-04-28 (×2): via INTRAVENOUS

## 2011-04-28 MED ORDER — SODIUM CHLORIDE 0.9 % IV SOLN: INTRAVENOUS | Status: DC

## 2011-04-28 MED ORDER — ASPIRIN EC 81 MG PO TBEC
81.0000 mg | DELAYED_RELEASE_TABLET | Freq: Every day | ORAL | Status: DC
  Administered 2011-04-29 – 2011-05-06 (×8): 81 mg via ORAL
  Filled 2011-04-28 (×10): qty 1

## 2011-04-28 MED ORDER — MORPHINE SULFATE 4 MG/ML IJ SOLN
4.0000 mg | Freq: Once | INTRAMUSCULAR | Status: AC
  Administered 2011-04-28: 4 mg via INTRAVENOUS
  Filled 2011-04-28: qty 1

## 2011-04-28 MED ORDER — PREGABALIN 75 MG PO CAPS
75.0000 mg | ORAL_CAPSULE | Freq: Two times a day (BID) | ORAL | Status: DC
  Administered 2011-04-28 – 2011-05-06 (×16): 75 mg via ORAL
  Filled 2011-04-28 (×16): qty 1

## 2011-04-28 MED ORDER — ONDANSETRON HCL 4 MG/2ML IJ SOLN
4.0000 mg | Freq: Once | INTRAMUSCULAR | Status: AC
  Administered 2011-04-28: 4 mg via INTRAVENOUS
  Filled 2011-04-28: qty 2

## 2011-04-28 MED ORDER — INSULIN ASPART 100 UNIT/ML ~~LOC~~ SOLN
0.0000 [IU] | Freq: Three times a day (TID) | SUBCUTANEOUS | Status: DC
  Administered 2011-04-29 – 2011-04-30 (×3): 2 [IU] via SUBCUTANEOUS
  Administered 2011-05-01 – 2011-05-03 (×4): 3 [IU] via SUBCUTANEOUS
  Administered 2011-05-04 – 2011-05-06 (×3): 2 [IU] via SUBCUTANEOUS
  Administered 2011-05-06: 3 [IU] via SUBCUTANEOUS

## 2011-04-28 NOTE — ED Notes (Signed)
Patient here with left foot swelling and redness and little toe with wound and black in color x 3 days, patient reports burning to foot. Reports that he wore shoes w/o socks

## 2011-04-28 NOTE — H&P (Signed)
Hospital Admission Note Date: 04/28/2011  Patient name: Aaron Long Medical record number: [id number] Date of birth: 1940/07/10 Age: 71 y.o. Gender: male PCP: No primary provider on file.  Attending physician: Ward Givens, MD  Chief Complaint:Ulcer on left 5th toe.  History of Present Illness: Patient is a 71 year old gentleman with diabetes type 2 and peripheral neuropathy. The patient states that 2 days ago he noticed a scab on his left toe. He states that he did not think that it was much of anything. However today he had difficulty walking on his foot and noticed that the lesion on the had worsene. and the foot was red. Patient states that he tried to see his primary care physician today for care however they were unable to accommodate him in the clinic today. He states that the pain was such that he felt he needed to come to the emergency room for further evaluation and management.  He denies any fever any fever/chills any nausea vomiting or diarrhea.  He denies any chest pain, cough, dizziness, dysuria.The patient does state that he has difficulty feeling in his lower extremities. The patient is from [facility] where he sees Dr. Arville Care as his primary care physician.  Scheduled Meds:   . insulin aspart  0-15 Units Subcutaneous TID WC  . insulin aspart  0-5 Units Subcutaneous QHS  .  morphine injection  4 mg Intravenous Once  . ondansetron (ZOFRAN) IV  4 mg Intravenous Once  . vancomycin  1,000 mg Intravenous Once   Continuous Infusions:   . sodium chloride 75 mL/hr at 04/28/11 1631  . sodium chloride     PRN Meds:. Allergies: Review of patient's allergies indicates no known allergies. Past Medical History  Diagnosis Date  . Diabetes mellitus   . Prostate cancer   . Coronary artery disease    Past Surgical History  Procedure Date  . Cardiac surgery   . Prostate surgery   . Knee cartilage surgery     left knee   History reviewed. No pertinent family  history. History   Social History  . Marital Status: Married    Spouse Name: N/A    Number of Children: N/A  . Years of Education: N/A   Occupational History  . Not on file.   Social History Main Topics  . Smoking status: Former Smoker -- 40 years    Quit date: 04/28/1991  . Smokeless tobacco: Never Used  . Alcohol Use: Yes     occasional  . Drug Use: Yes    Special: Marijuana  . Sexually Active:    Other Topics Concern  . Not on file   Social History Narrative  . No narrative on file   Review of Systems: A comprehensive review of systems was negative. Physical Exam: No intake or output data in the 24 hours ending 04/28/11 2005 General: Alert, awake, oriented x3, in no acute distress.  HEENT: [place]/AT PEERL, EOMI Neck: Trachea midline,  no masses, no thyromegal,y no JVD, no carotid bruit OROPHARYNX:  Moist, No exudate/ erythema/lesions.  Heart: Regular rate and rhythm, without murmurs, rubs, gallops, PMI non-displaced, no heaves or thrills on palpation.  Lungs: Clear to auscultation, no wheezing or rhonchi noted. No increased vocal fremitus resonant to percussion  Abdomen: Soft, nontender, nondistended, positive bowel sounds, no masses no hepatosplenomegaly noted..  Neuro: No focal neurological deficits noted cranial nerves II through XII grossly intact. DTRs 2+ bilaterally upper and lower extremities. Strength 5 out of 5 in  bilateral upper and lower extremities. Musculoskeletal: No warm swelling or erythema around joints, no spinal tenderness noted. Psychiatric: Patient alert and oriented x3, good insight and cognition, good recent to remote recall. Lymph node survey: No cervical axillary or inguinal lymphadenopathy noted.  Lab results:  [facility] 04/28/11 1523  NA 139  K 5.1  CL 103  CO2 28  GLUCOSE 171*  BUN 23  CREATININE 2.02*  CALCIUM 9.9  MG --  PHOS --    Basename 04/28/11 1523  AST 20  ALT 13  ALKPHOS 122*  BILITOT 0.2*  PROT 7.3  ALBUMIN 3.4*    No results found for this basename: LIPASE:2,AMYLASE:2 in the last 72 hours  Basename 04/28/11 1523  WBC 8.6  NEUTROABS 5.4  HGB 12.5*  HCT 38.3*  MCV 93.4  PLT 229   No results found for this basename: CKTOTAL:3,CKMB:3,CKMBINDEX:3,TROPONINI:3 in the last 72 hours No components found with this basename: POCBNP:3 No results found for this basename: DDIMER:2 in the last 72 hours No results found for this basename: HGBA1C:2 in the last 72 hours No results found for this basename: CHOL:2,HDL:2,LDLCALC:2,TRIG:2,CHOLHDL:2,LDLDIRECT:2 in the last 72 hours No results found for this basename: TSH,T4TOTAL,FREET3,T3FREE,THYROIDAB in the last 72 hours No results found for this basename: VITAMINB12:2,FOLATE:2,FERRITIN:2,TIBC:2,IRON:2,RETICCTPCT:2 in the last 72 hours Imaging results:  Dg Foot Complete Left  04/28/2011  *RADIOLOGY REPORT*  Clinical Data: Ulcer on the lateral aspect of the left fifth toe with swelling  LEFT FOOT - COMPLETE 3+ VIEW  Comparison: None.  Findings: The bones are diffusely osteopenic.  No focal demineralization or erosion is seen to indicate osteomyelitis. Tarsal - metatarsal alignment is normal.  Diffuse arterial calcifications are present.  IMPRESSION: No evidence of osteomyelitis.  Diffuse osteopenia with vascular calcifications present.  Original Report Authenticated By: Juline Patch, M.D.   Other results:    Patient Active Hospital Problem List: Diabetic foot ulcer associated with type 2 diabetes mellitus (04/28/2011)   Assessment:  The patient has a foot ulcer of foot it was his diabetes which is of some significance. The patient also appears to have decreased pulses in the left lower extremities which would indicate decreased blood flow. I think although the patient does not have osteomyelitis on the x-ray and given the fact that the patient is out of town it would be reasonable to bring patient in for observation to ensure that he does not have a worsening of his  diabetic foot ulcer.   Plan:  I will go ahead and start the patient on vancomycin and Zosyn for double coverage given his diabetes. I will also ask him to get pulses by Doppler. Dr. Luiz Blare from orthopedic surgery will see the patient in consultation.  HTN (hypertension) (04/28/2011)   Assessment:  Pressure significantly elevated at this time however the patient has not taken any blood pressure medications today. We'll go ahead and administer medications at this time and resume his usual medications.   Diabetes mellitus type 2 with complications (04/28/2011)   Assessment:  Resume usual medications and start a sliding scale. I will also check hemoglobin A1c on the patient    DVT prophylaxis: Lovenox every 24 hours.   Vian Fluegel A. 04/28/2011, 8:05 PM

## 2011-04-28 NOTE — Progress Notes (Signed)
ANTIBIOTIC CONSULT NOTE - INITIAL  Pharmacy Consult for Vancomycin/Zosyn Indication: Diabetic foot ulcer  No Known Allergies  Patient Measurements: Height: 5\' 8"  (172.7 cm) Weight: 204 lb 14.4 oz (92.942 kg) IBW/kg (Calculated) : 68.4  Adjusted Body Weight: 78kg  Vital Signs: Temp: 99 F (37.2 C) (04/17 2135) Temp src: Oral (04/17 2135) BP: 184/72 mmHg (04/17 2135) Pulse Rate: 80  (04/17 2135) Intake/Output from previous day:   Intake/Output from this shift:    Labs:  [facility] 04/28/11 1523  WBC 8.6  HGB 12.5*  PLT 229  LABCREA --  CREATININE 2.02*   Estimated Creatinine Clearance: 37.1 ml/min (by C-G formula based on Cr of 2.02).  20ml/min (normalized)   Microbiology: No results found for this or any previous visit (from the past 720 hour(s)).  Medical History: Past Medical History  Diagnosis Date  . Diabetes mellitus   . Prostate cancer   . Coronary artery disease     Medications:  Scheduled:    . aspirin EC  81 mg Oral Daily  . enoxaparin  40 mg Subcutaneous Q24H  . insulin aspart  0-15 Units Subcutaneous TID WC  . insulin aspart  0-5 Units Subcutaneous QHS  . insulin detemir  26 Units Subcutaneous BID  . lisinopril  10 mg Oral Daily  . lisinopril  10 mg Oral Once  . metoprolol tartrate  25 mg Oral BID  .  morphine injection  4 mg Intravenous Once  . ondansetron (ZOFRAN) IV  4 mg Intravenous Once  . pregabalin  75 mg Oral BID  . simvastatin  40 mg Oral q1800  . sodium chloride  3 mL Intravenous Q12H  . vancomycin  1,000 mg Intravenous Once   Assessment:  73 YOM with diabetes and peripheral neuropathy presents with diabetic foot ulcer  XRay is negative for osteomyelitis  Beginning broad spectrum antibiotics with vancomycin & zosyn  Goal of Therapy:  Vancomycin trough level 10-15 mcg/ml  Plan:   Vancomycin 1gm IV q24h Zosyn 3.375gm IV q8h (4hr extended infusions)  Check trough at steady state  Follow renal function & cultures  Loralee Pacas, PharmD, BCPS Pager: [phone] 04/28/2011,9:54 PM

## 2011-04-28 NOTE — Consult Note (Signed)
Reason for Consult:diabetic foot ulcer. Referring Physician: hospitalists  Aaron Long is an 71 y.o. male.  HPI: 71 yo male visiting with reportedly new foot ulcer.  He denies previous for treatment of this problem. We are consulted for in and possible outpatient treatment. 2 week history w/o prev problems  Past Medical History  Diagnosis Date  . Diabetes mellitus   . Prostate cancer   . Coronary artery disease     Past Surgical History  Procedure Date  . Cardiac surgery   . Prostate surgery   . Knee cartilage surgery     left knee    History reviewed. No pertinent family history.  Social History:  reports that he quit smoking about 20 years ago. He has never used smokeless tobacco. He reports that he drinks alcohol. He reports that he uses illicit drugs (Marijuana).  Allergies: No Known Allergies  Medications: I have reviewed the patient's current medications.  Results for orders placed during the hospital encounter of 04/28/11 (from the past 48 hour(s))  CBC     Status: Abnormal   Collection Time   04/28/11  3:23 PM      Component Value Range Comment   WBC 8.6  4.0 - 10.5 (K/uL)    RBC 4.10 (*) 4.22 - 5.81 (MIL/uL)    Hemoglobin 12.5 (*) 13.0 - 17.0 (g/dL)    HCT 16.1 (*) 09.6 - 52.0 (%)    MCV 93.4  78.0 - 100.0 (fL)    MCH 30.5  26.0 - 34.0 (pg)    MCHC 32.6  30.0 - 36.0 (g/dL)    RDW 04.5  40.9 - 81.1 (%)    Platelets 229  150 - 400 (K/uL)   DIFFERENTIAL     Status: Normal   Collection Time   04/28/11  3:23 PM      Component Value Range Comment   Neutrophils Relative 64  43 - 77 (%)    Neutro Abs 5.4  1.7 - 7.7 (K/uL)    Lymphocytes Relative 27  12 - 46 (%)    Lymphs Abs 2.3  0.7 - 4.0 (K/uL)    Monocytes Relative 8  3 - 12 (%)    Monocytes Absolute 0.7  0.1 - 1.0 (K/uL)    Eosinophils Relative 1  0 - 5 (%)    Eosinophils Absolute 0.1  0.0 - 0.7 (K/uL)    Basophils Relative 0  0 - 1 (%)    Basophils Absolute 0.0  0.0 - 0.1 (K/uL)   COMPREHENSIVE  METABOLIC PANEL     Status: Abnormal   Collection Time   04/28/11  3:23 PM      Component Value Range Comment   Sodium 139  135 - 145 (mEq/L)    Potassium 5.1  3.5 - 5.1 (mEq/L)    Chloride 103  96 - 112 (mEq/L)    CO2 28  19 - 32 (mEq/L)    Glucose, Bld 171 (*) 70 - 99 (mg/dL)    BUN 23  6 - 23 (mg/dL)    Creatinine, Ser 9.14 (*) 0.50 - 1.35 (mg/dL)    Calcium 9.9  8.4 - 10.5 (mg/dL)    Total Protein 7.3  6.0 - 8.3 (g/dL)    Albumin 3.4 (*) 3.5 - 5.2 (g/dL)    AST 20  0 - 37 (U/L)    ALT 13  0 - 53 (U/L)    Alkaline Phosphatase 122 (*) 39 - 117 (U/L)    Total Bilirubin 0.2 (*)  0.3 - 1.2 (mg/dL)    GFR calc non Af Amer 31 (*) >90 (mL/min)    GFR calc Af Amer 36 (*) >90 (mL/min)   SEDIMENTATION RATE     Status: Abnormal   Collection Time   04/28/11  3:23 PM      Component Value Range Comment   Sed Rate 120 (*) 0 - 16 (mm/hr)     Dg Foot Complete Left  04/28/2011  *RADIOLOGY REPORT*  Clinical Data: Ulcer on the lateral aspect of the left fifth toe with swelling  LEFT FOOT - COMPLETE 3+ VIEW  Comparison: None.  Findings: The bones are diffusely osteopenic.  No focal demineralization or erosion is seen to indicate osteomyelitis. Tarsal - metatarsal alignment is normal.  Diffuse arterial calcifications are present.  IMPRESSION: No evidence of osteomyelitis.  Diffuse osteopenia with vascular calcifications present.  Original Report Authenticated By: Juline Patch, M.D.    ROS:  i have reviewed the patients system review and there are no positive respomses as relates to HPI  EXAM: Blood pressure 184/72, pulse 80, temperature 99 F (37.2 C), temperature source Oral, resp. rate 18, height 5\' 8"  (1.727 m), weight 92.942 kg (204 lb 14.4 oz), SpO2 98.00%. WDWN male NAD Eyes not dilated Not using accessory muscles of respiration No rashes on exposed skin. L. Foot min pain to palpation. significant cellulitis over dorsum of foot. Absent pulses foot and poor pulses in popliteal  space  Assessment/Plan: 70 yo male from [place] town who has new onset ulcer on little toe lateral side.  Ulcer is dry and there is no need for debridement.// pt needs IV abx as treatment for cellulitis and observation of wound.  I would get ABI studies as well.  Once cellulitis recedes pt can be d/ced on oral abx and follow up in my office.  Melodye Swor L 04/28/2011, 10:05 PM

## 2011-04-28 NOTE — ED Provider Notes (Cosign Needed)
History     CSN: [id number]  Arrival date & time 04/28/11  1351   First MD Initiated Contact with Patient 04/28/11 1501      No chief complaint on file.   (Consider location/radiation/quality/duration/timing/severity/associated sxs/prior treatment) HPI  Patient relates recently he started wearing a leather loafers without wearing socks. He states he noted 2 days ago the top of his left little toe was getting dark and then developed an ulcer. He also relates he started getting swelling and redness of the dorsum of his left foot. He relates he's elevated it  and put ice packs on it without relief. He also states now is getting pain in his foot and is unable to bear weight on it. He denies any fever or chills. Patient has a history of diabetes with peripheral neuropathy.  PCP in [facility] Endocrinologist Dr Lucianne Muss  Past Medical History  Diagnosis Date  . Diabetes mellitus   . Prostate cancer   . Coronary artery disease    peripheral neuropathy High cholesterol   Past Surgical History  Procedure Date  . Cardiac surgery   . Prostate surgery    CABG  No family history on file.  History  Substance Use Topics  . Smoking status: Former Games developer  . Smokeless tobacco: Not on file  . Alcohol Use: rare  Lives alone in [place] that comes degrees per frequently to visit his daughter and his ex-wife   Review of Systems  All other systems reviewed and are negative.    Allergies  Review of patient's allergies indicates no known allergies.  Home Medications   Current Outpatient Rx  Name Route Sig Dispense Refill  . ASPIRIN EC 81 MG PO TBEC Oral Take 81 mg by mouth daily.      . INSULIN ASPART 100 UNIT/ML [place] SOLN Subcutaneous Inject 10-12 Units into the skin See admin instructions. USES 10 UNITS IN THE MORNING 10 UNITS AT LUNCH AND 12 UNITS AT SUPPER    . INSULIN DETEMIR 100 UNIT/ML [place] SOLN Subcutaneous Inject 26 Units into the skin 2 (two) times daily.     [name] LISINOPRIL 10  MG PO TABS Oral Take 10 mg by mouth daily.      [name] METOPROLOL TARTRATE 25 MG PO TABS Oral Take 25 mg by mouth 2 (two) times daily.     [name] PRAVASTATIN SODIUM 80 MG PO TABS Oral Take 80 mg by mouth daily.      [name] PREGABALIN 75 MG PO CAPS Oral Take 75 mg by mouth 2 (two) times daily.      BP 159/83  Pulse 72  Temp(Src) 99 F (37.2 C) (Oral)  Resp 18  Vital signs normal    Physical Exam  Constitutional: He is oriented to person, place, and time. He appears well-developed and well-nourished.  Non-toxic appearance. He does not appear ill. No distress.  HENT:  Head: Normocephalic and atraumatic.  Right Ear: External ear normal.  Left Ear: External ear normal.  Nose: Nose normal. No mucosal edema or rhinorrhea.  Mouth/Throat: Oropharynx is clear and moist and mucous membranes are normal. No dental abscesses or uvula swelling.  Eyes: Conjunctivae and EOM are normal. Pupils are equal, round, and reactive to light.  Neck: Normal range of motion and full passive range of motion without pain. Neck supple.  Cardiovascular: Exam reveals no gallop and no friction rub.   No murmur heard. Pulmonary/Chest: Effort normal. No respiratory distress. He has no rhonchi. He exhibits no crepitus.  Abdominal:  Normal appearance.  Musculoskeletal: Normal range of motion. He exhibits edema and tenderness.       Moves all extremities well. Patient has a ulcer approximately 1 cm in size of the dorsum of his left little toe has a black center. His cold dorsum of his left little toe has darkened skin. He has diffuse redness and swelling of his left foot with the redness stopping at the ankle however there is swelling involving the ankle also. His right lower treatment he is normal.  Neurological: He is alert and oriented to person, place, and time. He has normal strength. No cranial nerve deficit.  Skin: Skin is warm, dry and intact. No rash noted. No erythema. No pallor.  Psychiatric: He has a normal mood and affect.  His speech is normal and behavior is normal. His mood appears not anxious.    ED Course  Procedures (including critical care time)   Medications  0.9 %  sodium chloride infusion (  Intravenous New Bag/Given 04/28/11 1631)  morphine 4 MG/ML injection 4 mg (4 mg Intravenous Given 04/28/11 1629)  ondansetron (ZOFRAN) injection 4 mg (4 mg Intravenous Given 04/28/11 1629)  vancomycin (VANCOCIN) IVPB 1000 mg/200 mL premix (1000 mg Intravenous Given 04/28/11 1636)   17:51 Dr Ashley Royalty, admit to med-surg, team 6   Results for orders placed during the hospital encounter of 04/28/11  CBC      Component Value Range   WBC 8.6  4.0 - 10.5 (K/uL)   RBC 4.10 (*) 4.22 - 5.81 (MIL/uL)   Hemoglobin 12.5 (*) 13.0 - 17.0 (g/dL)   HCT 16.1 (*) 09.6 - 52.0 (%)   MCV 93.4  78.0 - 100.0 (fL)   MCH 30.5  26.0 - 34.0 (pg)   MCHC 32.6  30.0 - 36.0 (g/dL)   RDW 04.5  40.9 - 81.1 (%)   Platelets 229  150 - 400 (K/uL)  DIFFERENTIAL      Component Value Range   Neutrophils Relative 64  43 - 77 (%)   Neutro Abs 5.4  1.7 - 7.7 (K/uL)   Lymphocytes Relative 27  12 - 46 (%)   Lymphs Abs 2.3  0.7 - 4.0 (K/uL)   Monocytes Relative 8  3 - 12 (%)   Monocytes Absolute 0.7  0.1 - 1.0 (K/uL)   Eosinophils Relative 1  0 - 5 (%)   Eosinophils Absolute 0.1  0.0 - 0.7 (K/uL)   Basophils Relative 0  0 - 1 (%)   Basophils Absolute 0.0  0.0 - 0.1 (K/uL)  COMPREHENSIVE METABOLIC PANEL      Component Value Range   Sodium 139  135 - 145 (mEq/L)   Potassium 5.1  3.5 - 5.1 (mEq/L)   Chloride 103  96 - 112 (mEq/L)   CO2 28  19 - 32 (mEq/L)   Glucose, Bld 171 (*) 70 - 99 (mg/dL)   BUN 23  6 - 23 (mg/dL)   Creatinine, Ser 9.14 (*) 0.50 - 1.35 (mg/dL)   Calcium 9.9  8.4 - 78.2 (mg/dL)   Total Protein 7.3  6.0 - 8.3 (g/dL)   Albumin 3.4 (*) 3.5 - 5.2 (g/dL)   AST 20  0 - 37 (U/L)   ALT 13  0 - 53 (U/L)   Alkaline Phosphatase 122 (*) 39 - 117 (U/L)   Total Bilirubin 0.2 (*) 0.3 - 1.2 (mg/dL)   GFR calc non Af Amer 31 (*) >90  (mL/min)   GFR calc Af Amer 36 (*) >90 (mL/min)  SEDIMENTATION RATE      Component Value Range   Sed Rate 120 (*) 0 - 16 (mm/hr)   Laboratory interpretation all normal except very elevated sedimentation rate, hyperglycemia, chronic renal insufficiency   Dg Foot Complete Left  04/28/2011  *RADIOLOGY REPORT*  Clinical Data: Ulcer on the lateral aspect of the left fifth toe with swelling  LEFT FOOT - COMPLETE 3+ VIEW  Comparison: None.  Findings: The bones are diffusely osteopenic.  No focal demineralization or erosion is seen to indicate osteomyelitis. Tarsal - metatarsal alignment is normal.  Diffuse arterial calcifications are present.  IMPRESSION: No evidence of osteomyelitis.  Diffuse osteopenia with vascular calcifications present.  Original Report Authenticated By: Juline Patch, M.D.      1. Diabetic foot ulcer   2. Cellulitis of foot, left   3. Hyperglycemia   4. Renal insufficiency     Plan admission  Devoria Albe, MD, FACEP   MDM          Ward Givens, MD 04/28/11 [phone]

## 2011-04-29 DIAGNOSIS — R7309 Other abnormal glucose: Secondary | ICD-10-CM | POA: Diagnosis not present

## 2011-04-29 DIAGNOSIS — E1352 Other specified diabetes mellitus with diabetic peripheral angiopathy with gangrene: Secondary | ICD-10-CM | POA: Diagnosis present

## 2011-04-29 DIAGNOSIS — I129 Hypertensive chronic kidney disease with stage 1 through stage 4 chronic kidney disease, or unspecified chronic kidney disease: Secondary | ICD-10-CM | POA: Diagnosis not present

## 2011-04-29 DIAGNOSIS — E1129 Type 2 diabetes mellitus with other diabetic kidney complication: Secondary | ICD-10-CM | POA: Diagnosis not present

## 2011-04-29 DIAGNOSIS — I96 Gangrene, not elsewhere classified: Secondary | ICD-10-CM | POA: Diagnosis present

## 2011-04-29 DIAGNOSIS — E1149 Type 2 diabetes mellitus with other diabetic neurological complication: Secondary | ICD-10-CM | POA: Diagnosis not present

## 2011-04-29 DIAGNOSIS — N189 Chronic kidney disease, unspecified: Secondary | ICD-10-CM | POA: Diagnosis not present

## 2011-04-29 DIAGNOSIS — E1142 Type 2 diabetes mellitus with diabetic polyneuropathy: Secondary | ICD-10-CM | POA: Diagnosis present

## 2011-04-29 DIAGNOSIS — L97509 Non-pressure chronic ulcer of other part of unspecified foot with unspecified severity: Secondary | ICD-10-CM | POA: Diagnosis not present

## 2011-04-29 DIAGNOSIS — L03039 Cellulitis of unspecified toe: Secondary | ICD-10-CM | POA: Diagnosis present

## 2011-04-29 DIAGNOSIS — Z794 Long term (current) use of insulin: Secondary | ICD-10-CM | POA: Diagnosis not present

## 2011-04-29 DIAGNOSIS — E1169 Type 2 diabetes mellitus with other specified complication: Secondary | ICD-10-CM | POA: Diagnosis not present

## 2011-04-29 DIAGNOSIS — I70269 Atherosclerosis of native arteries of extremities with gangrene, unspecified extremity: Secondary | ICD-10-CM | POA: Diagnosis not present

## 2011-04-29 DIAGNOSIS — R42 Dizziness and giddiness: Secondary | ICD-10-CM | POA: Diagnosis not present

## 2011-04-29 DIAGNOSIS — N179 Acute kidney failure, unspecified: Secondary | ICD-10-CM | POA: Diagnosis not present

## 2011-04-29 DIAGNOSIS — Z8546 Personal history of malignant neoplasm of prostate: Secondary | ICD-10-CM | POA: Diagnosis not present

## 2011-04-29 DIAGNOSIS — N183 Chronic kidney disease, stage 3 unspecified: Secondary | ICD-10-CM | POA: Diagnosis not present

## 2011-04-29 DIAGNOSIS — IMO0002 Reserved for concepts with insufficient information to code with codable children: Secondary | ICD-10-CM | POA: Diagnosis present

## 2011-04-29 DIAGNOSIS — I10 Essential (primary) hypertension: Secondary | ICD-10-CM | POA: Diagnosis not present

## 2011-04-29 DIAGNOSIS — R059 Cough, unspecified: Secondary | ICD-10-CM | POA: Diagnosis not present

## 2011-04-29 DIAGNOSIS — N184 Chronic kidney disease, stage 4 (severe): Secondary | ICD-10-CM | POA: Diagnosis present

## 2011-04-29 DIAGNOSIS — E1159 Type 2 diabetes mellitus with other circulatory complications: Secondary | ICD-10-CM | POA: Diagnosis present

## 2011-04-29 DIAGNOSIS — I2510 Atherosclerotic heart disease of native coronary artery without angina pectoris: Secondary | ICD-10-CM | POA: Diagnosis present

## 2011-04-29 DIAGNOSIS — D649 Anemia, unspecified: Secondary | ICD-10-CM | POA: Diagnosis present

## 2011-04-29 LAB — GLUCOSE, CAPILLARY
Glucose-Capillary: 99 mg/dL (ref 70–99)
Glucose-Capillary: 142 mg/dL — ABNORMAL HIGH (ref 70–99)
Glucose-Capillary: 101 mg/dL — ABNORMAL HIGH (ref 70–99)

## 2011-04-29 MED ORDER — ACETAMINOPHEN 325 MG PO TABS
650.0000 mg | ORAL_TABLET | ORAL | Status: DC | PRN
  Administered 2011-05-05: 650 mg via ORAL
  Filled 2011-04-29: qty 2

## 2011-04-29 MED ORDER — POLYETHYLENE GLYCOL 3350 17 G PO PACK
17.0000 g | PACK | Freq: Every day | ORAL | Status: DC | PRN
  Administered 2011-05-04 – 2011-05-05 (×2): 17 g via ORAL
  Filled 2011-04-29 (×3): qty 1

## 2011-04-29 MED ORDER — OXYCODONE HCL 5 MG PO TABS
5.0000 mg | ORAL_TABLET | ORAL | Status: DC | PRN
  Administered 2011-04-29 – 2011-05-05 (×20): 5 mg via ORAL
  Filled 2011-04-29 (×3): qty 1
  Filled 2011-04-29: qty 2
  Filled 2011-04-29 (×18): qty 1

## 2011-04-29 NOTE — Progress Notes (Signed)
Subjective: Patient states his foot feels much better today. He has no other complaints at this time.  Interval history: The patient was seen by Dr. Luiz Blare of orthopedic surgery who recommends continued IV antibiotics at this time. He does not feel necessary for debridement.  Objective: Filed Vitals:   04/28/11 2350 04/29/11 0147 04/29/11 0530 04/29/11 1310  BP: 152/78 158/64 150/77 159/72  Pulse:   69 71  Temp:   99 F (37.2 C) 98.5 F (36.9 C)  TempSrc:   Oral Oral  Resp:   18 18  Height: 5\' 8"  (1.727 m)     Weight: 92.534 kg (204 lb)     SpO2:   98% 97%   Weight change:   Intake/Output Summary (Last 24 hours) at 04/29/11 1853 Last data filed at 04/29/11 1310  Gross per 24 hour  Intake 1646.25 ml  Output    875 ml  Net 771.25 ml   General: Alert, awake, oriented x3, in no acute distress.  HEENT: [place]/AT PEERL, EOMI  Neck: Trachea midline, no masses, no thyromegal,y no JVD, no carotid bruit  OROPHARYNX: Moist, No exudate/ erythema/lesions.  Heart: Regular rate and rhythm, without murmurs, rubs, gallops, PMI non-displaced, no heaves or thrills on palpation.  Lungs: Clear to auscultation, no wheezing or rhonchi noted. No increased vocal fremitus resonant to percussion  Abdomen: Soft, nontender, nondistended, positive bowel sounds, no masses no hepatosplenomegaly noted..  Neuro: No focal neurological deficits noted cranial nerves II through XII grossly intact. DTRs 2+ bilaterally upper and lower extremities. Strength functional in bilateral upper and lower extremities. Poor pulses in left foot Musculoskeletal: No warm swelling or erythema around joints, no spinal tenderness noted.  Psychiatric: Patient alert and oriented x3, good insight and cognition, good recent to remote recall.  Lymph node survey: No cervical axillary or inguinal lymphadenopathy noted.    Lab Results:  [facility] 04/28/11 1523  NA 139  K 5.1  CL 103  CO2 28  GLUCOSE 171*  BUN 23  CREATININE 2.02*    CALCIUM 9.9  MG --  PHOS --    Basename 04/28/11 1523  AST 20  ALT 13  ALKPHOS 122*  BILITOT 0.2*  PROT 7.3  ALBUMIN 3.4*   No results found for this basename: LIPASE:2,AMYLASE:2 in the last 72 hours  Basename 04/28/11 1523  WBC 8.6  NEUTROABS 5.4  HGB 12.5*  HCT 38.3*  MCV 93.4  PLT 229   No results found for this basename: CKTOTAL:3,CKMB:3,CKMBINDEX:3,TROPONINI:3 in the last 72 hours No components found with this basename: POCBNP:3 No results found for this basename: DDIMER:2 in the last 72 hours  Basename 04/28/11 0323  HGBA1C 9.1*   No results found for this basename: CHOL:2,HDL:2,LDLCALC:2,TRIG:2,CHOLHDL:2,LDLDIRECT:2 in the last 72 hours No results found for this basename: TSH,T4TOTAL,FREET3,T3FREE,THYROIDAB in the last 72 hours No results found for this basename: VITAMINB12:2,FOLATE:2,FERRITIN:2,TIBC:2,IRON:2,RETICCTPCT:2 in the last 72 hours  Micro Results: No results found for this or any previous visit (from the past 240 hour(s)).  Studies/Results: Dg Foot Complete Left  04/28/2011  *RADIOLOGY REPORT*  Clinical Data: Ulcer on the lateral aspect of the left fifth toe with swelling  LEFT FOOT - COMPLETE 3+ VIEW  Comparison: None.  Findings: The bones are diffusely osteopenic.  No focal demineralization or erosion is seen to indicate osteomyelitis. Tarsal - metatarsal alignment is normal.  Diffuse arterial calcifications are present.  IMPRESSION: No evidence of osteomyelitis.  Diffuse osteopenia with vascular calcifications present.  Original Report Authenticated By: Juline Patch, M.D.  Medications: I have reviewed the patient's current medications. Scheduled Meds:   . aspirin EC  81 mg Oral Daily  . enoxaparin  40 mg Subcutaneous Q24H  . insulin aspart  0-15 Units Subcutaneous TID WC  . insulin aspart  0-5 Units Subcutaneous QHS  . insulin detemir  26 Units Subcutaneous BID  . lisinopril  10 mg Oral Daily  . lisinopril  10 mg Oral Once  . metoprolol  tartrate  25 mg Oral BID  . piperacillin-tazobactam (ZOSYN)  IV  3.375 g Intravenous Q8H  . pregabalin  75 mg Oral BID  . simvastatin  40 mg Oral q1800  . sodium chloride  3 mL Intravenous Q12H  . vancomycin  1,000 mg Intravenous Q24H   Continuous Infusions:   . DISCONTD: sodium chloride 75 mL/hr at 04/28/11 2130  . DISCONTD: sodium chloride     PRN Meds:.sodium chloride, acetaminophen, oxyCODONE, sodium chloride Assessment/Plan: Patient Active Hospital Problem List:  Dry gangrene of left fifth digit due to secondary diabetes. (04/29/2011)   Assessment: The foot appears to have improved today with administration of IV antibiotics. I appreciate Dr. Darene Lamer input and I will continue IV antibiotics with vancomycin and Zosyn at least for another 24-48 hours and observe progress of the foot. The plan is then to transition to an oral antibiotics for a total of 10 days and then the patient is to follow up with Dr. Valentino Hue as an outpatient.  Diabetic foot ulcer associated with type 2 diabetes mellitus (04/28/2011)   Assessment: See above    HTN (hypertension) (04/28/2011)   Assessment: Blood pressure still elevated and not at goal for someone with diabetes. We'll continue to adjust medications after observation through today.   Diabetes mellitus type 2 with complications (04/28/2011)   Assessment: Blood sugars well-controlled although patient's hemoglobin A1c reflects uncontrolled diabetes and long-term basis.    Plan: Continue current regimen.   LOS: 1 day

## 2011-04-29 NOTE — Progress Notes (Signed)
ABI completed.  Preliminary report is ABI can not be ascertained due to the posterior tibial artery is non compressible secondary to calcification bilaterally.  Right great toe pressure is 44, which indicates adequate perfusion.  Left great toe PPG waveform appears flat, which indicates inadequate perfusion.

## 2011-04-30 DIAGNOSIS — E1149 Type 2 diabetes mellitus with other diabetic neurological complication: Secondary | ICD-10-CM | POA: Diagnosis not present

## 2011-04-30 DIAGNOSIS — L97509 Non-pressure chronic ulcer of other part of unspecified foot with unspecified severity: Secondary | ICD-10-CM | POA: Diagnosis not present

## 2011-04-30 DIAGNOSIS — I70269 Atherosclerosis of native arteries of extremities with gangrene, unspecified extremity: Secondary | ICD-10-CM

## 2011-04-30 DIAGNOSIS — I96 Gangrene, not elsewhere classified: Secondary | ICD-10-CM | POA: Diagnosis not present

## 2011-04-30 DIAGNOSIS — I10 Essential (primary) hypertension: Secondary | ICD-10-CM | POA: Diagnosis not present

## 2011-04-30 DIAGNOSIS — E1169 Type 2 diabetes mellitus with other specified complication: Secondary | ICD-10-CM | POA: Diagnosis not present

## 2011-04-30 LAB — DIFFERENTIAL
Basophils Absolute: 0.1 10*3/uL (ref 0.0–0.1)
Eosinophils Relative: 3 % (ref 0–5)
Lymphocytes Relative: 30 % (ref 12–46)
Lymphs Abs: 2.3 10*3/uL (ref 0.7–4.0)
Neutro Abs: 4.4 10*3/uL (ref 1.7–7.7)

## 2011-04-30 LAB — CBC
HCT: 33.9 % — ABNORMAL LOW (ref 39.0–52.0)
Hemoglobin: 10.8 g/dL — ABNORMAL LOW (ref 13.0–17.0)
MCH: 29.8 pg (ref 26.0–34.0)
MCHC: 31.9 g/dL (ref 30.0–36.0)
Platelets: 192 10*3/uL (ref 150–400)
RDW: 13.2 % (ref 11.5–15.5)

## 2011-04-30 LAB — CREATININE, SERUM
GFR calc Af Amer: 29 mL/min — ABNORMAL LOW (ref >90)
GFR calc non Af Amer: 25 mL/min — ABNORMAL LOW (ref >90)

## 2011-04-30 LAB — GLUCOSE, CAPILLARY
Glucose-Capillary: 122 mg/dL — ABNORMAL HIGH (ref 70–99)
Glucose-Capillary: 219 mg/dL — ABNORMAL HIGH (ref 70–99)

## 2011-04-30 MED ORDER — SODIUM CHLORIDE 0.9 % IV SOLN
250.0000 mL | INTRAVENOUS | Status: DC | PRN
  Administered 2011-05-01: 250 mL via INTRAVENOUS
  Administered 2011-05-01: 20 mL via INTRAVENOUS
  Administered 2011-05-02 (×2): 250 mL via INTRAVENOUS

## 2011-04-30 MED ORDER — HYDRALAZINE HCL 25 MG PO TABS
25.0000 mg | ORAL_TABLET | Freq: Three times a day (TID) | ORAL | Status: DC
  Administered 2011-04-30 – 2011-05-06 (×17): 25 mg via ORAL
  Filled 2011-04-30 (×27): qty 1

## 2011-04-30 NOTE — Consult Note (Signed)
[facility] [place] HISTORY AND PHYSICAL   Reason for consult: Gangrene toe Requesting: Aaron Geralds, MD  History of Present Illness:  Patient is a 71 y.o. year old male who presents for evaluation of left foot ulcer.  He is currently on Vanc and Zosyn for a left foot cellulitis. This has improved.   He has a history of neuropathy and rubbed an ulcer on his foot from a shoe without socks.  He was unable to walk on this a few days ago and now can bear weight.  Other medical problems include diabetes, renal insufficiency and coronary artery disease.  He apparently was scheduled for outpt eval by Nephrology on May 5.  Past Medical History  Diagnosis Date  . Diabetes mellitus   . Prostate cancer   . Coronary artery disease     Past Surgical History  Procedure Date  . Cardiac surgery   . Prostate surgery   . Knee cartilage surgery     left knee   Right carotid endarterectomy  Social History History  Substance Use Topics  . Smoking status: Former Smoker -- 40 years    Quit date: 04/28/1991  . Smokeless tobacco: Never Used  . Alcohol Use: Yes     occasional    Family History History reviewed. No pertinent family history.  Allergies  No Known Allergies   Current Facility-Administered Medications  Medication Dose Route Frequency Provider Last Rate Last Dose  . 0.9 %  sodium chloride infusion  250 mL Intravenous PRN Aaron Harm, MD      . acetaminophen (TYLENOL) tablet 650 mg  650 mg Oral Q4H PRN Aaron Harm, MD      . aspirin EC tablet 81 mg  81 mg Oral Daily Aaron Harm, MD   81 mg at 04/30/11 0920  . enoxaparin (LOVENOX) injection 40 mg  40 mg Subcutaneous Q24H Aaron Harm, MD   40 mg at 04/29/11 2310  . insulin aspart (novoLOG) injection 0-15 Units  0-15 Units Subcutaneous TID WC Aaron Harm, MD   2 Units at 04/29/11 1725  . insulin aspart (novoLOG) injection 0-5 Units  0-5 Units Subcutaneous QHS Aaron Harm, MD   3 Units at 04/28/11 2253  . insulin detemir (LEVEMIR) injection 26 Units  26 Units Subcutaneous BID Aaron Harm, MD   26 Units at 04/30/11 0920  . lisinopril (PRINIVIL,ZESTRIL) tablet 10 mg  10 mg Oral Daily Aaron Harm, MD   10 mg at 04/30/11 0920  . lisinopril (PRINIVIL,ZESTRIL) tablet 10 mg  10 mg Oral Once Aaron Harm, MD      . metoprolol tartrate (LOPRESSOR) tablet 25 mg  25 mg Oral BID Aaron Harm, MD   25 mg at 04/30/11 0920  . oxyCODONE (Oxy IR/ROXICODONE) immediate release tablet 5 mg  5 mg Oral Q4H PRN Aaron Harm, MD   5 mg at 04/30/11 1478  . piperacillin-tazobactam (ZOSYN) IVPB 3.375 g  3.375 g Intravenous Q8H Aaron Long, PHARMD   3.375 g at 04/30/11 2956  . polyethylene glycol (MIRALAX / GLYCOLAX) packet 17 g  17 g Oral Daily PRN Aaron Graff, NP      . pregabalin (LYRICA) capsule 75 mg  75 mg Oral BID Aaron Harm, MD   75 mg at 04/30/11 0920  . simvastatin (ZOCOR) tablet 40 mg  40 mg Oral q1800 Aaron Harm, MD   40 mg at 04/29/11 1717  . sodium  chloride 0.9 % injection 3 mL  3 mL Intravenous Q12H Aaron Harm, MD   3 mL at 04/29/11 1004  . sodium chloride 0.9 % injection 3 mL  3 mL Intravenous PRN Aaron Harm, MD      . vancomycin (VANCOCIN) IVPB 1000 mg/200 mL premix  1,000 mg Intravenous Q24H Aaron Long, PHARMD   1,000 mg at 04/29/11 1510    ROS:   General:  No weight loss, Fever, chills  HEENT: No recent headaches, no nasal bleeding, no visual changes, no sore throat  Neurologic: No dizziness, blackouts, seizures. No recent symptoms of stroke or mini- stroke. No recent episodes of slurred speech, or temporary blindness.  Cardiac: No recent episodes of chest pain/pressure, no shortness of breath at rest.  + shortness of breath with exertion.  Denies history of atrial fibrillation or irregular heartbeat  Vascular: No history of rest pain in feet.  No history of  claudication.  No history of non-healing ulcer, No history of DVT   Pulmonary: No home oxygen, no productive cough, no hemoptysis,  No asthma or wheezing  Musculoskeletal:  [ ]  Arthritis, [ ]  Low back pain,  [x ] Joint pain  Hematologic:No history of hypercoagulable state.  No history of easy bleeding.  No history of anemia  Gastrointestinal: No hematochezia or melena,  No gastroesophageal reflux, no trouble swallowing  Urinary: [x ] chronic Kidney disease, [ ]  on HD - [ ]  MWF or [ ]  TTHS, [ ]  Burning with urination, [ ]  Frequent urination, [ ]  Difficulty urinating;   Skin: No rashes  Psychological: No history of anxiety,  No history of depression   Physical Examination  Filed Vitals:   04/29/11 1310 04/29/11 2230 04/29/11 2310 04/30/11 0530  BP: 159/72 172/75 172/79 149/76  Pulse: 71 57 68 62  Temp: 98.5 F (36.9 C) 98.9 F (37.2 C)  97.4 F (36.3 C)  TempSrc: Oral Oral  Oral  Resp: 18 17  6   Height:      Weight:      SpO2: 97% 99%  97%    Body mass index is 31.02 kg/(m^2).  General:  Alert and oriented, no acute distress HEENT: Normal Neck: No bruit or JVD, right neck scar Pulmonary: Clear to auscultation bilaterally Cardiac: Regular Rate and Rhythm without murmur Abdomen: Soft, non-tender, non-distended, no mass, no scars, obese Skin: No rash, dorsal erythema distal left forefoot, gangrene left 5 toe tip, no drainage Extremity Pulses:  2+ radial, brachial, 1+ femoral, absent popliteal dorsalis pedis, posterior tibial pulses bilaterally Musculoskeletal: No deformity or edema  Neurologic: Upper and lower extremity motor 5/5 and symmetric  DATA: ABI reviewed, calcified vessels toe pressure left 0 and right 44, monophasic waveforms   ASSESSMENT: Severe arterial occlusive disease most likely multilevel SFA and tibial disease but possibly iliac disease as well with diminished femoral pulses.  He has gangrene of the left fifth toe which is a limb threatening situation.   He needs an arteriogram to further evaluate his leg and possible stenting or bypass.  Unfortunately, he also has significant renal dysfunction which will put him at risk for contrast nephropathy or even dialysis.     PLAN: 1 Recommend Nephrology consult since they were going to see him anyway in a few weeks to optimize his renal function  2. Gentle IV hydration and repeat BMET after hydration 3. Continue IV antibiotics 4.  Transfer to [facility] hospital medical service so that when he is optimized we can  proceed with arteriogram early next week  Fabienne Bruns, MD [facility] [place] Office: [phone] Pager: [phone]  Fabienne Bruns, MD [facility] [place] Office: [phone] Pager: [phone]

## 2011-04-30 NOTE — Progress Notes (Signed)
Subjective: Patient states his foot feels much better today. Still with poor pulses. I spoke with orthopedic surgery regarding vascular surgery consult. He agreed to evaluation from vascular surgeon would be appropriate for this patient. The patient however has no further complaint  Interval history: No significant events overnight   Objective: Filed Vitals:   04/29/11 2230 04/29/11 2310 04/30/11 0530 04/30/11 1503  BP: 172/75 172/79 149/76 150/70  Pulse: 57 68 62 66  Temp: 98.9 F (37.2 C)  97.4 F (36.3 C) 99.2 F (37.3 C)  TempSrc: Oral  Oral Oral  Resp: 17  6 18   Height:      Weight:      SpO2: 99%  97% 98%   Weight change:   Intake/Output Summary (Last 24 hours) at 04/30/11 1659 Last data filed at 04/30/11 1319  Gross per 24 hour  Intake   1667 ml  Output   1950 ml  Net   -283 ml   General: Alert, awake, oriented x3, in no acute distress.  HEENT: [place]/AT PEERL, EOMI  Neck: Trachea midline, no masses, no thyromegal,y no JVD, no carotid bruit  OROPHARYNX: Moist, No exudate/ erythema/lesions.  Heart: Regular rate and rhythm, without murmurs, rubs, gallops, PMI non-displaced, no heaves or thrills on palpation.  Lungs: Clear to auscultation, no wheezing or rhonchi noted. No increased vocal fremitus resonant to percussion  Abdomen: Soft, nontender, nondistended, positive bowel sounds, no masses no hepatosplenomegaly noted..  Neuro: No focal neurological deficits noted cranial nerves II through XII grossly intact. DTRs 2+ bilaterally upper and lower extremities. Strength functional in bilateral upper and lower extremities. Poor pulses in left foot Skin: Erythema decreased on the dorsum of the left foot. Appearance of the left are essentially unchanged   Lab Results:  Basename 04/30/11 1029 04/28/11 1523  NA -- 139  K -- 5.1  CL -- 103  CO2 -- 28  GLUCOSE -- 171*  BUN -- 23  CREATININE 2.44* 2.02*  CALCIUM -- 9.9  MG -- --  PHOS -- --    Basename 04/28/11 1523  AST  20  ALT 13  ALKPHOS 122*  BILITOT 0.2*  PROT 7.3  ALBUMIN 3.4*   No results found for this basename: LIPASE:2,AMYLASE:2 in the last 72 hours  Basename 04/30/11 0341 04/28/11 1523  WBC 7.5 8.6  NEUTROABS 4.4 5.4  HGB 10.8* 12.5*  HCT 33.9* 38.3*  MCV 93.6 93.4  PLT 192 229   No results found for this basename: CKTOTAL:3,CKMB:3,CKMBINDEX:3,TROPONINI:3 in the last 72 hours No components found with this basename: POCBNP:3 No results found for this basename: DDIMER:2 in the last 72 hours  Basename 04/28/11 0323  HGBA1C 9.1*   No results found for this basename: CHOL:2,HDL:2,LDLCALC:2,TRIG:2,CHOLHDL:2,LDLDIRECT:2 in the last 72 hours No results found for this basename: TSH,T4TOTAL,FREET3,T3FREE,THYROIDAB in the last 72 hours No results found for this basename: VITAMINB12:2,FOLATE:2,FERRITIN:2,TIBC:2,IRON:2,RETICCTPCT:2 in the last 72 hours  Micro Results: No results found for this or any previous visit (from the past 240 hour(s)).  Studies/Results: Dg Foot Complete Left  04/28/2011  *RADIOLOGY REPORT*  Clinical Data: Ulcer on the lateral aspect of the left fifth toe with swelling  LEFT FOOT - COMPLETE 3+ VIEW  Comparison: None.  Findings: The bones are diffusely osteopenic.  No focal demineralization or erosion is seen to indicate osteomyelitis. Tarsal - metatarsal alignment is normal.  Diffuse arterial calcifications are present.  IMPRESSION: No evidence of osteomyelitis.  Diffuse osteopenia with vascular calcifications present.  Original Report Authenticated By: Juline Patch, M.D.  Medications: I have reviewed the patient's current medications. Scheduled Meds:    . aspirin EC  81 mg Oral Daily  . enoxaparin  40 mg Subcutaneous Q24H  . insulin aspart  0-15 Units Subcutaneous TID WC  . insulin aspart  0-5 Units Subcutaneous QHS  . insulin detemir  26 Units Subcutaneous BID  . lisinopril  10 mg Oral Daily  . lisinopril  10 mg Oral Once  . metoprolol tartrate  25 mg Oral BID   . piperacillin-tazobactam (ZOSYN)  IV  3.375 g Intravenous Q8H  . pregabalin  75 mg Oral BID  . simvastatin  40 mg Oral q1800  . sodium chloride  3 mL Intravenous Q12H  . 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 (04/29/2011)   Assessment: [name] vascular compromise raises the question as to whether or not there be any appreciable healing of the left toe. I discussed with orthopedic surgery the possibility of a vascular surgery consult and they have consulted vascular surgery to make recommendations regarding this gentleman is care in light of the fact that he has poor blood flow when the ABIs were attempted..  Diabetic foot ulcer associated with type 2 diabetes mellitus (04/28/2011)   Assessment: See above    HTN (hypertension) (04/28/2011)   Assessment: Blood pressure still elevated and not at goal for someone with diabetes. We'll add hydralazine 25 mg every 8 hours to current regimen.   Diabetes mellitus type 2 with complications (04/28/2011)   Assessment: Blood sugars well-controlled although patient's hemoglobin A1c reflects uncontrolled diabetes and long-term basis.    Plan: Continue current regimen.   LOS: 2 days

## 2011-04-30 NOTE — Progress Notes (Signed)
Subjective: No c/o's. On IV antibiotics.  Objective: Vital signs in last 24 hours: Temp:  [97.4 F (36.3 C)-98.9 F (37.2 C)] 97.4 F (36.3 C) (04/19 0530) Pulse Rate:  [57-71] 62  (04/19 0530) Resp:  [6-18] 6  (04/19 0530) BP: (149-172)/(72-79) 149/76 mmHg (04/19 0530) SpO2:  [97 %-99 %] 97 % (04/19 0530)  Intake/Output from previous day: 04/18 0701 - 04/19 0700 In: 1667 [P.O.:1020; I.V.:647] Out: 1800 [Urine:1800] Intake/Output this shift: Total I/O In: 240 [P.O.:240] Out: 200 [Urine:200]   Basename 04/30/11 0341 04/28/11 1523  HGB 10.8* 12.5*    Basename 04/30/11 0341 04/28/11 1523  WBC 7.5 8.6  RBC 3.62* 4.10*  HCT 33.9* 38.3*  PLT 192 229    Basename 04/28/11 1523  NA 139  K 5.1  CL 103  CO2 28  BUN 23  CREATININE 2.02*  GLUCOSE 171*  CALCIUM 9.9    Status: Final result MyChart: Not Released      Result Narrative      [facility]* *[facility]* [address] [place], [place] [address] [phone]  ------------------------------------------------------------ Noninvasive Vascular Lab  Lower Extremity Arterial Evaluation  Patient: Aaron Long, Aaron Long MR #: [id number] Study Date: 04/29/2011 Gender: M Age: 71 Height: Weight: BSA: Pt. Status: Room: 1305  Lenox Ponds ATTENDING Marthann Schiller SONOGRAPHER Alesia Banda, RVT Reports also to:  ------------------------------------------------------------ History and indications:  History  707.15 Diabetic foot ulcer. 447.9 Peripheral arterial disease (PAD).  ------------------------------------------------------------ Study information:  Portable. Study status: Routine. ABI. Segmental pressure measurements, ankle-brachial indices, and photoplethysmography. Location: Bedside. Patient status: Inpatient. Brachial pressures:  +--------+-----+---+  RightMax +--------+-----+---+ Systolic150  150 +--------+-----+---+ Arterial pressure indices:  +-----------------+--------+--------------+----------+ Location PressureBrachial indexWaveform  +-----------------+--------+--------------+----------+ Right ant tibial Hg0.87 Monophasic +-----------------+--------+--------------+----------+ Right post tibial277mm Hg1.83 Monophasic +-----------------+--------+--------------+----------+ Right 1st toe 44mm Hg 0.29 Normal  +-----------------+--------+--------------+----------+ Left ant tibial 60mm Hg 0.4 Monophasic +-----------------+--------+--------------+----------+ Left post tibial Hg1.93 Monophasic +-----------------+--------+--------------+----------+ Left 1st toe ----------------------Absent  +-----------------+--------+--------------+----------+  ------------------------------------------------------------ Summary: Bilateral: ABI not ascertained due to the posterior artery is non compressible secondary to calcification. Right: Great toe pressure indicates adequate perfusion. Left: Great toe PPG waveform appears flat, which indicates inadequate perfusion.  Other specific details can be found in the table(s) above. Prepared and Electronically Authenticated by  Waverly Ferrari, MD 2013-04-18T15:[id number]        Specimen Collected: 04/29/11 9:21 AM  Last Resulted: 04/29/11 3:43 PM   Left foot exam: Still has some erythema on dorsum of foot. 5th toe red/swollen.  Assessment/Plan: Cellulitis/infection left 5th toe. Poor arterial flow to left foot Plan: We spoke with Dr Edilia Bo vascular surgeon and will get vascular consult from MD on call. Cont IV antibiotics in the meantime.   Aloysious Vangieson G 04/30/2011, 10:54 AM

## 2011-05-01 DIAGNOSIS — E1169 Type 2 diabetes mellitus with other specified complication: Secondary | ICD-10-CM | POA: Diagnosis not present

## 2011-05-01 DIAGNOSIS — I96 Gangrene, not elsewhere classified: Secondary | ICD-10-CM | POA: Diagnosis not present

## 2011-05-01 DIAGNOSIS — I10 Essential (primary) hypertension: Secondary | ICD-10-CM | POA: Diagnosis not present

## 2011-05-01 DIAGNOSIS — E1149 Type 2 diabetes mellitus with other diabetic neurological complication: Secondary | ICD-10-CM | POA: Diagnosis not present

## 2011-05-01 DIAGNOSIS — L97509 Non-pressure chronic ulcer of other part of unspecified foot with unspecified severity: Secondary | ICD-10-CM | POA: Diagnosis not present

## 2011-05-01 DIAGNOSIS — I70269 Atherosclerosis of native arteries of extremities with gangrene, unspecified extremity: Secondary | ICD-10-CM | POA: Diagnosis not present

## 2011-05-01 LAB — GLUCOSE, CAPILLARY: Glucose-Capillary: 200 mg/dL — ABNORMAL HIGH (ref 70–99)

## 2011-05-01 LAB — VANCOMYCIN, TROUGH: Vancomycin Tr: 13 ug/mL (ref 10.0–20.0)

## 2011-05-01 MED ORDER — AMLODIPINE BESYLATE 5 MG PO TABS
5.0000 mg | ORAL_TABLET | Freq: Every day | ORAL | Status: DC
  Administered 2011-05-01 – 2011-05-03 (×3): 5 mg via ORAL
  Filled 2011-05-01 (×4): qty 1

## 2011-05-01 NOTE — Progress Notes (Addendum)
ANTIBIOTIC CONSULT NOTE - FOLLOW UP  Pharmacy Consult for Vancomycin/Zosyn Indication: Foot ulcer/diabetic  No Known Allergies  Patient Measurements: Height: 5\' 8"  (172.7 cm) Weight: 204 lb (92.534 kg) IBW/kg (Calculated) : 68.4   Vital Signs: Temp: 98.6 F (37 C) (04/20 0523) Temp src: Oral (04/20 0523) BP: 162/72 mmHg (04/20 0523) Pulse Rate: 67  (04/20 0523) Intake/Output from previous day: 04/19 0701 - 04/20 0700 In: 720 [P.O.:720] Out: 1625 [Urine:1625] Intake/Output from this shift:    Labs:  Basename 04/30/11 1029 04/30/11 0341 04/28/11 1523  WBC -- 7.5 8.6  HGB -- 10.8* 12.5*  PLT -- 192 229  LABCREA -- -- --  CREATININE 2.44* -- 2.02*   Estimated Creatinine Clearance: 30.6 ml/min (by C-G formula based on Cr of 2.44). No results found for this basename: VANCOTROUGH:2,VANCOPEAK:2,VANCORANDOM:2,GENTTROUGH:2,GENTPEAK:2,GENTRANDOM:2,TOBRATROUGH:2,TOBRAPEAK:2,TOBRARND:2,AMIKACINPEAK:2,AMIKACINTROU:2,AMIKACIN:2, in the last 72 hours   Microbiology: No results found for this or any previous visit (from the past 720 hour(s)).  Anti-infectives     Start     Dose/Rate Route Frequency Ordered Stop   04/29/11 1600   vancomycin (VANCOCIN) IVPB 1000 mg/200 mL premix        1,000 mg 200 mL/hr over 60 Minutes Intravenous Every 24 hours 04/28/11 2158     04/28/11 2300  piperacillin-tazobactam (ZOSYN) IVPB 3.375 g       3.375 g 12.5 mL/hr over 240 Minutes Intravenous Every 8 hours 04/28/11 2158     04/28/11 1600   vancomycin (VANCOCIN) IVPB 1000 mg/200 mL premix        1,000 mg 200 mL/hr over 60 Minutes Intravenous  Once 04/28/11 1511 04/28/11 1736          Assessment:  71 yo male with dry gangrene L fifth toe. Vascular consult requested. No osteomyelitis noted.  No cultures ordered.  Hx DM, Hgb A1c 9.1; indicative of poor control.  Vancomycin 1gm q24/Zosyn 3.375gm q8 from 4/17, renal function worsening.  Goal of Therapy:  Vancomycin trough level 10-15  mcg/ml  Plan:   Vanc trough today before 4th dose., worsening renal function.  Otho Bellows PharmD Pager [phone] 05/01/2011,1:00 PM   Vancomycin trough= 13.0 on 05/01/11 This is in goal range for the trough as stated above (10-70mcg/ml) Plan to continue current therapy of Vancomycin 1Gram IV q 24 hours. Loletta Specter

## 2011-05-01 NOTE — Progress Notes (Signed)
Subjective:     Left foot infection Activity level:  Resting in beb Diet tolerance:  eating Voiding:  ok Patient reports pain as 2 on 0-10 scale.    Objective: Vital signs in last 24 hours: Temp:  [98.6 F (37 C)-99.2 F (37.3 C)] 98.6 F (37 C) (04/20 0523) Pulse Rate:  [66-100] 67  (04/20 0523) Resp:  [16-18] 16  (04/20 0523) BP: (150-162)/(70-78) 162/72 mmHg (04/20 0523) SpO2:  [97 %-98 %] 97 % (04/20 0523)  Labs:  Basename 04/30/11 0341 04/28/11 1523  HGB 10.8* 12.5*    Basename 04/30/11 0341 04/28/11 1523  WBC 7.5 8.6  RBC 3.62* 4.10*  HCT 33.9* 38.3*  PLT 192 229    Basename 04/30/11 1029 04/28/11 1523  NA -- 139  K -- 5.1  CL -- 103  CO2 -- 28  BUN -- 23  CREATININE 2.44* 2.02*  GLUCOSE -- 171*  CALCIUM -- 9.9   No results found for this basename: LABPT:2,INR:2 in the last 72 hours  Physical Exam:  Dorsiflexion/Plantar flexion intact Compartment soft Left foot with a little less cellulitis Less tender as well No drainage or malodor  Assessment/Plan:   Left foot infection     Vascular is transferring to [facility] Sunday for further studies. Dr.Graves will follow up with him on Monday.     Lyana Asbill R 05/01/2011, 1:48 PM

## 2011-05-01 NOTE — Progress Notes (Addendum)
Subjective: Patient without any complaints today. Discussed with patient either transfer to [facility] for angiogram and possible revascularization. The patient is requesting that he be transferred on tomorrow instead of today.  Interval history: Recommendations from vascular surgery noted. The patient will be transferred to [facility] on tomorrow per the patient's request.    Objective: Filed Vitals:   04/30/11 1503 04/30/11 2047 05/01/11 0523 05/01/11 1424  BP: 150/70 152/78 162/72 178/77  Pulse: 66 100 67 65  Temp: 99.2 F (37.3 C) 98.7 F (37.1 C) 98.6 F (37 C) 98.6 F (37 C)  TempSrc: Oral Oral Oral Oral  Resp: 18 18 16 18   Height:      Weight:      SpO2: 98% 97% 97% 99%   Weight change:   Intake/Output Summary (Last 24 hours) at 05/01/11 1734 Last data filed at 05/01/11 1200  Gross per 24 hour  Intake   1080 ml  Output   1400 ml  Net   -320 ml   General: Alert, awake, oriented x3, in no acute distress.  HEENT: [place]/AT PEERL, EOMI  Neck: Trachea midline, no masses, no thyromegal,y no JVD, no carotid bruit  OROPHARYNX: Moist, No exudate/ erythema/lesions.  Heart: Regular rate and rhythm, without murmurs, rubs, gallops, PMI non-displaced, no heaves or thrills on palpation.  Lungs: Clear to auscultation, no wheezing or rhonchi noted. No increased vocal fremitus resonant to percussion  Abdomen: Soft, nontender, nondistended, positive bowel sounds, no masses no hepatosplenomegaly noted..  Neuro: No focal neurological deficits noted cranial nerves II through XII grossly intact. DTRs 2+ bilaterally upper and lower extremities. Strength functional in bilateral upper and lower extremities. Poor pulses in left foot Skin: Erythema decreased on the dorsum of the left foot. Appearance of the left are essentially unchanged   Lab Results:  Basename 04/30/11 1029  NA --  K --  CL --  CO2 --  GLUCOSE --  BUN --  CREATININE 2.44*  CALCIUM --  MG --  PHOS --   No results  found for this basename: AST:2,ALT:2,ALKPHOS:2,BILITOT:2,PROT:2,ALBUMIN:2 in the last 72 hours No results found for this basename: LIPASE:2,AMYLASE:2 in the last 72 hours  Basename 04/30/11 0341  WBC 7.5  NEUTROABS 4.4  HGB 10.8*  HCT 33.9*  MCV 93.6  PLT 192   No results found for this basename: CKTOTAL:3,CKMB:3,CKMBINDEX:3,TROPONINI:3 in the last 72 hours No components found with this basename: POCBNP:3 No results found for this basename: DDIMER:2 in the last 72 hours No results found for this basename: HGBA1C:2 in the last 72 hours No results found for this basename: CHOL:2,HDL:2,LDLCALC:2,TRIG:2,CHOLHDL:2,LDLDIRECT:2 in the last 72 hours No results found for this basename: TSH,T4TOTAL,FREET3,T3FREE,THYROIDAB in the last 72 hours No results found for this basename: VITAMINB12:2,FOLATE:2,FERRITIN:2,TIBC:2,IRON:2,RETICCTPCT:2 in the last 72 hours  Micro Results: No results found for this or any previous visit (from the past 240 hour(s)).  Studies/Results: Dg Foot Complete Left  04/28/2011  *RADIOLOGY REPORT*  Clinical Data: Ulcer on the lateral aspect of the left fifth toe with swelling  LEFT FOOT - COMPLETE 3+ VIEW  Comparison: None.  Findings: The bones are diffusely osteopenic.  No focal demineralization or erosion is seen to indicate osteomyelitis. Tarsal - metatarsal alignment is normal.  Diffuse arterial calcifications are present.  IMPRESSION: No evidence of osteomyelitis.  Diffuse osteopenia with vascular calcifications present.  Original Report Authenticated By: Juline Patch, M.D.    Medications: I have reviewed the patient's current medications. Scheduled Meds:    . aspirin EC  81 mg Oral Daily  . enoxaparin  40 mg Subcutaneous Q24H  . hydrALAZINE  25 mg Oral Q8H  . insulin aspart  0-15 Units Subcutaneous TID WC  . insulin aspart  0-5 Units Subcutaneous QHS  . insulin detemir  26 Units Subcutaneous BID  . lisinopril  10 mg Oral Daily  . lisinopril  10 mg Oral Once    . metoprolol tartrate  25 mg Oral BID  . piperacillin-tazobactam (ZOSYN)  IV  3.375 g Intravenous Q8H  . pregabalin  75 mg Oral BID  . simvastatin  40 mg Oral q1800  . sodium chloride  3 mL Intravenous Q12H  . vancomycin  1,000 mg Intravenous Q24H   Continuous Infusions:  PRN Meds:.sodium chloride, acetaminophen, oxyCODONE, polyethylene glycol, sodium chloride  Assessment/Plan: Patient Active Hospital Problem List:  Dry gangrene of left fifth digit due to secondary diabetes. (04/29/2011)   Assessment: The patient was seen by vascular surgery and the recommendations as far for angiogram and possible stenting to revascularization to the left lower extremity. Adding some complication today she is the fact that the patient has some renal sufficiency. I will plan on pre-medicating the patient with Mucomyst and hydrating him well prior to his study.   Diabetic foot ulcer associated with type 2 diabetes mellitus (04/28/2011)   Assessment: See above    HTN (hypertension) (04/28/2011)   Assessment: Blood pressure still elevated and not at goal for someone with diabetes. We'll add hydralazine 25 mg every 8 hours to current regimen. Will discontinue Lisinopril and add Norvasc.   Diabetes mellitus type 2 with complications (04/28/2011)   Assessment: Blood sugars well-controlled although patient's hemoglobin A1c reflects uncontrolled diabetes and long-term basis.    Plan: Continue current regimen.  CKD Stage IV   Assessment: Pt was being scheduled to see a nephrologist in the near future. Based on observation over the past 48 hours, it appears that he has CKD stage 3. I will discontinue his Lisinopril in light of this and anticipated angiogram in next few days.    LOS: 3 days

## 2011-05-02 DIAGNOSIS — N179 Acute kidney failure, unspecified: Secondary | ICD-10-CM | POA: Diagnosis present

## 2011-05-02 DIAGNOSIS — I96 Gangrene, not elsewhere classified: Secondary | ICD-10-CM | POA: Diagnosis not present

## 2011-05-02 DIAGNOSIS — I70269 Atherosclerosis of native arteries of extremities with gangrene, unspecified extremity: Secondary | ICD-10-CM | POA: Diagnosis not present

## 2011-05-02 DIAGNOSIS — E1149 Type 2 diabetes mellitus with other diabetic neurological complication: Secondary | ICD-10-CM | POA: Diagnosis not present

## 2011-05-02 DIAGNOSIS — I10 Essential (primary) hypertension: Secondary | ICD-10-CM | POA: Diagnosis not present

## 2011-05-02 DIAGNOSIS — L0390 Cellulitis, unspecified: Secondary | ICD-10-CM | POA: Diagnosis present

## 2011-05-02 LAB — GLUCOSE, CAPILLARY: Glucose-Capillary: 147 mg/dL — ABNORMAL HIGH (ref 70–99)

## 2011-05-02 LAB — BASIC METABOLIC PANEL
Calcium: 9 mg/dL (ref 8.4–10.5)
Chloride: 100 mEq/L (ref 96–112)
Creatinine, Ser: 3.06 mg/dL — ABNORMAL HIGH (ref 0.50–1.35)
GFR calc Af Amer: 22 mL/min — ABNORMAL LOW (ref >90)
Sodium: 136 mEq/L (ref 135–145)

## 2011-05-02 MED ORDER — SODIUM CHLORIDE 0.9 % IV SOLN
INTRAVENOUS | Status: DC
  Administered 2011-05-02: 100 mL/h via INTRAVENOUS
  Administered 2011-05-03 – 2011-05-05 (×5): via INTRAVENOUS

## 2011-05-02 MED ORDER — ATORVASTATIN CALCIUM 20 MG PO TABS
20.0000 mg | ORAL_TABLET | Freq: Every day | ORAL | Status: DC
Start: 2011-05-02 — End: 2011-05-06
  Administered 2011-05-02 – 2011-05-05 (×4): 20 mg via ORAL
  Filled 2011-05-02 (×6): qty 1

## 2011-05-02 MED ORDER — ACETYLCYSTEINE 20 % IN SOLN: 600.0000 mg | Freq: Two times a day (BID) | RESPIRATORY_TRACT | Status: DC

## 2011-05-02 MED ORDER — ENOXAPARIN SODIUM 30 MG/0.3ML ~~LOC~~ SOLN
30.0000 mg | SUBCUTANEOUS | Status: DC
  Administered 2011-05-02 – 2011-05-05 (×4): 30 mg via SUBCUTANEOUS
  Filled 2011-05-02 (×7): qty 0.3

## 2011-05-02 MED ORDER — SODIUM CHLORIDE 0.9 % IV SOLN: 250.0000 mL | INTRAVENOUS | Status: DC | PRN

## 2011-05-02 NOTE — Progress Notes (Signed)
Subjective: Patient without any complaints today. Discussed with patient either transfer to [facility] for angiogram and possible revascularization. The patient is requesting that he be transferred on tomorrow instead of today.  Interval history: Patient has had an increase of creatinine from 2.44 to 3.06 today.   Objective: Filed Vitals:   04/30/11 2047 05/01/11 0523 05/01/11 1424 05/01/11 2115  BP: 152/78 162/72 178/77 158/74  Pulse: 100 67 65 70  Temp: 98.7 F (37.1 C) 98.6 F (37 C) 98.6 F (37 C) 98.4 F (36.9 C)  TempSrc: Oral Oral Oral Oral  Resp: 18 16 18 19   Height:      Weight:      SpO2: 97% 97% 99% 97%   Weight change:   Intake/Output Summary (Last 24 hours) at 05/02/11 1502 Last data filed at 05/01/11 2115  Gross per 24 hour  Intake   1040 ml  Output    425 ml  Net    615 ml   General: Alert, awake, oriented x3, in no acute distress. He appears euvolemic in nature HEENT: [place]/AT PEERL, EOMI   Heart: Regular rate and rhythm, without murmurs, rubs, gallops, PMI non-displaced, no heaves or thrills on palpation.  Lungs: Clear to auscultation, no wheezing or rhonchi noted. No increased vocal fremitus resonant to percussion  Abdomen: Soft, nontender, nondistended, positive bowel sounds, no masses no hepatosplenomegaly noted..  Neuro: No focal neurological deficits noted cranial nerves II through XII grossly intact. DTRs 2+ bilaterally upper and lower extremities. Strength functional in bilateral upper and lower extremities. Diminished pulses in left foot Skin: Erythema decreased on the dorsum of the left foot. Appearance of the left are essentially unchanged   Lab Results:  Basename 05/02/11 0412 04/30/11 1029  NA 136 --  K 4.1 --  CL 100 --  CO2 27 --  GLUCOSE 139* --  BUN 26* --  CREATININE 3.06* 2.44*  CALCIUM 9.0 --  MG -- --  PHOS -- --   No results found for this basename: AST:2,ALT:2,ALKPHOS:2,BILITOT:2,PROT:2,ALBUMIN:2 in the last 72 hours No  results found for this basename: LIPASE:2,AMYLASE:2 in the last 72 hours  Basename 04/30/11 0341  WBC 7.5  NEUTROABS 4.4  HGB 10.8*  HCT 33.9*  MCV 93.6  PLT 192   No results found for this basename: CKTOTAL:3,CKMB:3,CKMBINDEX:3,TROPONINI:3 in the last 72 hours No components found with this basename: POCBNP:3 No results found for this basename: DDIMER:2 in the last 72 hours No results found for this basename: HGBA1C:2 in the last 72 hours No results found for this basename: CHOL:2,HDL:2,LDLCALC:2,TRIG:2,CHOLHDL:2,LDLDIRECT:2 in the last 72 hours No results found for this basename: TSH,T4TOTAL,FREET3,T3FREE,THYROIDAB in the last 72 hours No results found for this basename: VITAMINB12:2,FOLATE:2,FERRITIN:2,TIBC:2,IRON:2,RETICCTPCT:2 in the last 72 hours  Micro Results: No results found for this or any previous visit (from the past 240 hour(s)).  Studies/Results: Dg Foot Complete Left  04/28/2011  *RADIOLOGY REPORT*  Clinical Data: Ulcer on the lateral aspect of the left fifth toe with swelling  LEFT FOOT - COMPLETE 3+ VIEW  Comparison: None.  Findings: The bones are diffusely osteopenic.  No focal demineralization or erosion is seen to indicate osteomyelitis. Tarsal - metatarsal alignment is normal.  Diffuse arterial calcifications are present.  IMPRESSION: No evidence of osteomyelitis.  Diffuse osteopenia with vascular calcifications present.  Original Report Authenticated By: Juline Patch, M.D.    Medications: I have reviewed the patient's current medications. Scheduled Meds:    . amLODipine  5 mg Oral Daily  . aspirin EC  81 mg Oral Daily  . atorvastatin  20 mg Oral q1800  . enoxaparin  30 mg Subcutaneous Q24H  . hydrALAZINE  25 mg Oral Q8H  . insulin aspart  0-15 Units Subcutaneous TID WC  . insulin aspart  0-5 Units Subcutaneous QHS  . insulin detemir  26 Units Subcutaneous BID  . lisinopril  10 mg Oral Once  . metoprolol tartrate  25 mg Oral BID  . piperacillin-tazobactam  (ZOSYN)  IV  3.375 g Intravenous Q8H  . pregabalin  75 mg Oral BID  . sodium chloride  3 mL Intravenous Q12H  . vancomycin  1,000 mg Intravenous Q24H  . DISCONTD: acetylcysteine  600 mg Oral BID  . DISCONTD: enoxaparin  40 mg Subcutaneous Q24H  . DISCONTD: lisinopril  10 mg Oral Daily  . DISCONTD: simvastatin  40 mg Oral q1800   Continuous Infusions:    . sodium chloride     PRN Meds:.acetaminophen, oxyCODONE, polyethylene glycol, sodium chloride, DISCONTD: sodium chloride, DISCONTD: sodium chloride  Assessment/Plan: Patient Active Hospital Problem List: Acute kidney injury (05/01/2011)   Assessment: Patient has had a progressive rise in his creatinine. He thinks his baseline is approximately 2.5. He said to jump in creatinine going from 2.4 for up to 3.06 today. I believe this to be multifactorial contributed to by his hypertension diabetes and nephrotoxic agents including vancomycin. As discussed the patient with Dr. Reynolds Bowl from nephrology who agrees with the current plan of care. He also agrees with Mucomyst administration however per pharmacy Mucomyst is being restricted to be used only for acetaminophen overdose and nebulization. Per my discussion with Dr. Abel Presto is quite possible that the angiogram may have to be postponed until the renal function is optimized.   Plan: We will continue the patient's current care including hydration. I will increase his IV fluids 100 mils per hour and monitor him closely. At present the patient's physical examination does not reflect fluid overload. His cough gentamicin today's 13 which is within normal range. We will continue to hold his lisinopril and the patient will be seen by nephrology when he transferred over to [facility].  Dry gangrene of left fifth digit due to secondary diabetes. (04/29/2011)   Assessment: The patient was seen by vascular surgery and the recommendations as far for angiogram and possible stenting to revascularization to  the left lower extremity. We will continue IV antibiotics with vancomycin and Zosyn. Patient is currently on day #5 of IV antibiotics.  Diabetic foot ulcer associated with type 2 diabetes mellitus (04/28/2011)   Assessment: See above    HTN (hypertension) (04/28/2011)   Assessment: Blood pressure still elevated and not at goal for someone with diabetes. We'll add hydralazine 25 mg every 8 hours to current regimen. Will discontinue Lisinopril and add Norvasc.   Diabetes mellitus type 2 with complications (04/28/2011)   Assessment: Blood sugars well-controlled although patient's hemoglobin A1c reflects uncontrolled diabetes and long-term basis.    Plan: Continue current regimen.  CKD Stage III   Assessment: Pt was being scheduled to see a nephrologist in the near future. Based on observation over the past 48 hours, it appears that he has CKD stage 3. Continue with IV fluids and continue to hold ACE inhibitors.    LOS: 4 days

## 2011-05-02 NOTE — Consult Note (Signed)
Reason for Consult:CKD with AKI Referring Physician: Dr. Tawana Scale is an 71 y.o. male.  HPI: 71 yr old man with 1 wk of progressive 5 toe and evolving to foot pain in L foot. Ulcer on toe.  No past hx of this.   Hx 18 yr DM 2, HTN, CABG 5 yr ago, Prostate Ca with surgery 2 yr ago.  Admit meds see hx.   Has calf pain walking 1/4 mi and some foot pain at hs, attributed to neuropathy in past.  Known CKD and saw Kidney Spec in [place].  Cr 1.9 2 yr ago and 2.02 her 4 day ago, and now 3.06.    Pos FH in brother on HD and father who died of renal disease.  No FH of inherited eye, hearing or ms skel defects.  Has hx Renal Stone 20 yr ago.  No hx of UTIs and no allergies.  Strong FH of DM in 5 sibs and mother.   He has 3 healthy children.  Mother has abdm Ca of some type and brother had colon Ca.  One sister died of DM complic.    No itching, ms cramping, SOB, PND or CP.  No ankle edema. No HA but hx catarracts, no laser surgery on eyes No asthma but 40 pk yr, stopped 20 yr ago Constip, no Hep, no indigestion No skin rash or itching Arthritis mild and diffuse, occ.  Hx L knee op No CP, edema PND, orthop, No fever or chill Appetite good ? Pain in feet at night but no other neuro sx. No Psych hx.    Past Medical History  Diagnosis Date  . Diabetes mellitus   . Prostate cancer   . Coronary artery disease     Past Surgical History  Procedure Date  . Cardiac surgery   . Prostate surgery   . Knee cartilage surgery     left knee  Catarracts, R carotid FH as above, DM, Renal Disease, Colon Ca, 2 sibs died in accidents. Social History:  reports that he quit smoking about 20 years ago. He has never used smokeless tobacco. He reports that he drinks alcohol. He reports that he uses illicit drugs (Marijuana).  Allergies: No Known Allergies  Medications:  I have reviewed the patient's current medications. Prior to Admission:  Prescriptions prior to admission  Medication Sig  Dispense Refill  . aspirin EC 81 MG tablet Take 81 mg by mouth daily.        . insulin aspart (NOVOLOG) 100 UNIT/ML injection Inject 10-12 Units into the skin See admin instructions. USES 10 UNITS IN THE MORNING 10 UNITS AT LUNCH AND 12 UNITS AT SUPPER      . insulin detemir (LEVEMIR) 100 UNIT/ML injection Inject 26 Units into the skin 2 (two) times daily.       [name] lisinopril (PRINIVIL,ZESTRIL) 10 MG tablet Take 10 mg by mouth daily.        . metoprolol tartrate (LOPRESSOR) 25 MG tablet Take 25 mg by mouth 2 (two) times daily.       . pravastatin (PRAVACHOL) 80 MG tablet Take 80 mg by mouth daily.        . pregabalin (LYRICA) 75 MG capsule Take 75 mg by mouth 2 (two) times daily.      [name] azithromycin (ZITHROMAX) 250 MG tablet Take 1 tablet (250 mg total) by mouth daily. Take first 2 tablets together, then 1 every day until finished.  4 tablet  0  Scheduled:   . amLODipine  5 mg Oral Daily  . aspirin EC  81 mg Oral Daily  . atorvastatin  20 mg Oral q1800  . enoxaparin  30 mg Subcutaneous Q24H  . hydrALAZINE  25 mg Oral Q8H  . insulin aspart  0-15 Units Subcutaneous TID WC  . insulin aspart  0-5 Units Subcutaneous QHS  . insulin detemir  26 Units Subcutaneous BID  . lisinopril  10 mg Oral Once  . metoprolol tartrate  25 mg Oral BID  . piperacillin-tazobactam (ZOSYN)  IV  3.375 g Intravenous Q8H  . pregabalin  75 mg Oral BID  . sodium chloride  3 mL Intravenous Q12H  . vancomycin  1,000 mg Intravenous Q24H  . DISCONTD: acetylcysteine  600 mg Oral BID  . DISCONTD: enoxaparin  40 mg Subcutaneous Q24H  . DISCONTD: simvastatin  40 mg Oral q1800   Continuous:   . sodium chloride       Results for orders placed during the hospital encounter of 04/28/11 (from the past 48 hour(s))  GLUCOSE, CAPILLARY     Status: Abnormal   Collection Time   04/30/11  8:40 PM      Component Value Range Comment   Glucose-Capillary 219 (*) 70 - 99 (mg/dL)   GLUCOSE, CAPILLARY     Status: Normal    Collection Time   05/01/11  7:56 AM      Component Value Range Comment   Glucose-Capillary 71  70 - 99 (mg/dL)   GLUCOSE, CAPILLARY     Status: Abnormal   Collection Time   05/01/11 11:46 AM      Component Value Range Comment   Glucose-Capillary 173 (*) 70 - 99 (mg/dL)    Comment 1 Documented in Chart      Comment 2 Notify RN     [place], [place]     Status: Normal   Collection Time   05/01/11  3:18 PM      Component Value Range Comment   Vancomycin Tr 13.0  10.0 - 20.0 (ug/mL)   GLUCOSE, CAPILLARY     Status: Abnormal   Collection Time   05/01/11  4:59 PM      Component Value Range Comment   Glucose-Capillary 120 (*) 70 - 99 (mg/dL)    Comment 1 Documented in Chart      Comment 2 Notify RN     GLUCOSE, CAPILLARY     Status: Abnormal   Collection Time   05/01/11  9:15 PM      Component Value Range Comment   Glucose-Capillary 200 (*) 70 - 99 (mg/dL)    Comment 1 Notify RN     BASIC METABOLIC PANEL     Status: Abnormal   Collection Time   05/02/11  4:12 AM      Component Value Range Comment   Sodium 136  135 - 145 (mEq/L)    Potassium 4.1  3.5 - 5.1 (mEq/L)    Chloride 100  96 - 112 (mEq/L)    CO2 27  19 - 32 (mEq/L)    Glucose, Bld 139 (*) 70 - 99 (mg/dL)    BUN 26 (*) 6 - 23 (mg/dL)    Creatinine, Ser 1.61 (*) 0.50 - 1.35 (mg/dL)    Calcium 9.0  8.4 - 10.5 (mg/dL)    GFR calc non Af Amer 19 (*) >90 (mL/min)    GFR calc Af Amer 22 (*) >90 (mL/min)   GLUCOSE, CAPILLARY     Status: Normal  Collection Time   05/02/11  8:38 AM      Component Value Range Comment   Glucose-Capillary 79  70 - 99 (mg/dL)    Comment 1 Documented in Chart      Comment 2 Notify RN     GLUCOSE, CAPILLARY     Status: Abnormal   Collection Time   05/02/11 11:50 AM      Component Value Range Comment   Glucose-Capillary 159 (*) 70 - 99 (mg/dL)    Comment 1 Documented in Chart      Comment 2 Notify RN     GLUCOSE, CAPILLARY     Status: Abnormal   Collection Time   05/02/11  4:37 PM      Component  Value Range Comment   Glucose-Capillary 147 (*) 70 - 99 (mg/dL)   GLUCOSE, CAPILLARY     Status: Abnormal   Collection Time   05/02/11  5:59 PM      Component Value Range Comment   Glucose-Capillary 128 (*) 70 - 99 (mg/dL)    Comment 1 Documented in Chart      Comment 2 Notify RN       No results found.  @ROS @ Blood pressure 188/75, pulse 65, temperature 98.9 F (37.2 C), temperature source Oral, resp. rate 18, height 5\' 8"  (1.727 m), weight 92.534 kg (204 lb), SpO2 99.00%. @PHYSEXAMBYAGE2 @ Physical Examination: General appearance - alert, well appearing, and in no distress, overweight, well hydrated and mild edema Mental status - alert, oriented to person, place, and time, normal mood, behavior, speech, dress, motor activity, and thought processes, affect appropriate to mood Eyes - pupils equal and reactive, extraocular eye movements intact, sclera anicteric, funduscopic exam normal, discs flat and sharp, no background diabetic retinopathy is noted Mouth - mucous membranes moist, pharynx normal without lesions, tonsils normal and tongue normal Neck - carotid bruit noted, bilat bruits, scar over R carotid    Lymphatics - posterior cervical nodes Chest - clear to auscultation, no wheezes, rales or rhonchi, symmetric air entry, decreased air entry noted bilat Heart - normal rate, regular rhythm, normal S1, S2, no murmurs, rubs, clicks or gallops, S1 and S2 normal, S4 present, systolic murmur 2/6 at apex, carotid bruit noted bilat, bilat fem bruits Abdomen - soft, nontender, nondistended, no masses or organomegaly hepatomegaly 2 cm down, pos bs, lower abdm scar no abdominal bruits protub Back exam - full range of motion, no tenderness, palpable spasm or pain on motion Musculoskeletal - abnormal exam of left knee, hypertrophic Extremities - pedal edema 1 plus +, peripheral pulses abnormal no DP 0r PT bilat, trophic changes Skin - as above  Assessment/Plan: 1 CKD 3 - 4 most likely DM,  but ? Htn and past W/U and need to eval to assess risk as well as look at complic 2 AKI ? Cause.  No obvious hemodynamic change or vol change to implicate ACEI but may now have been receiving it and not at home.  ? AIN from meds, R/O obstruction, High risk for RAS with diffuse disease.  R/O AGN from cellulitis. 3 Hypertension: not well controlled, can push Amlodipine. 4. Complic of renal disease,need to eval 5. DM needs control 6 PVD HOLD OFF angio until renal function stabilizes.   7 CAD 8 Carotid Diseas 9 Cellulitis  eval AB for reaction  P UA, SPEP, UPEP, U/S, C3 & 4, PTH, Fe/TIBC,  U Na, U Cr.  Aaron Long L 05/02/2011, 6:28 PM

## 2011-05-03 ENCOUNTER — Inpatient Hospital Stay (HOSPITAL_COMMUNITY): Payer: Medicare Other

## 2011-05-03 ENCOUNTER — Encounter (HOSPITAL_COMMUNITY)
Admission: EM | Disposition: A | Discharge status: Home / Self Care | Payer: Self-pay | Source: Home / Self Care | Attending: Internal Medicine

## 2011-05-03 ENCOUNTER — Ambulatory Visit (HOSPITAL_COMMUNITY): Admit: 2011-05-03 | Payer: Self-pay | Admitting: Vascular Surgery

## 2011-05-03 DIAGNOSIS — I70269 Atherosclerosis of native arteries of extremities with gangrene, unspecified extremity: Secondary | ICD-10-CM | POA: Diagnosis not present

## 2011-05-03 DIAGNOSIS — E1149 Type 2 diabetes mellitus with other diabetic neurological complication: Secondary | ICD-10-CM | POA: Diagnosis not present

## 2011-05-03 DIAGNOSIS — I96 Gangrene, not elsewhere classified: Secondary | ICD-10-CM | POA: Diagnosis not present

## 2011-05-03 DIAGNOSIS — I10 Essential (primary) hypertension: Secondary | ICD-10-CM | POA: Diagnosis not present

## 2011-05-03 DIAGNOSIS — N189 Chronic kidney disease, unspecified: Secondary | ICD-10-CM | POA: Diagnosis not present

## 2011-05-03 DIAGNOSIS — E1169 Type 2 diabetes mellitus with other specified complication: Secondary | ICD-10-CM | POA: Diagnosis not present

## 2011-05-03 DIAGNOSIS — I129 Hypertensive chronic kidney disease with stage 1 through stage 4 chronic kidney disease, or unspecified chronic kidney disease: Secondary | ICD-10-CM | POA: Diagnosis not present

## 2011-05-03 DIAGNOSIS — L97509 Non-pressure chronic ulcer of other part of unspecified foot with unspecified severity: Secondary | ICD-10-CM | POA: Diagnosis not present

## 2011-05-03 DIAGNOSIS — E1129 Type 2 diabetes mellitus with other diabetic kidney complication: Secondary | ICD-10-CM | POA: Diagnosis not present

## 2011-05-03 LAB — URINALYSIS, ROUTINE W REFLEX MICROSCOPIC
Bilirubin Urine: NEGATIVE
Leukocytes, UA: NEGATIVE
Nitrite: NEGATIVE
Specific Gravity, Urine: 1.007 (ref 1.005–1.030)
Urobilinogen, UA: 0.2 mg/dL (ref 0.0–1.0)
pH: 7 (ref 5.0–8.0)

## 2011-05-03 LAB — CK: Total CK: 159 U/L (ref 7–232)

## 2011-05-03 LAB — HEPATITIS B SURFACE ANTIGEN: Hepatitis B Surface Ag: NEGATIVE

## 2011-05-03 LAB — URINE MICROSCOPIC-ADD ON

## 2011-05-03 LAB — GLUCOSE, CAPILLARY
Glucose-Capillary: 128 mg/dL — ABNORMAL HIGH (ref 70–99)
Glucose-Capillary: 122 mg/dL — ABNORMAL HIGH (ref 70–99)
Glucose-Capillary: 175 mg/dL — ABNORMAL HIGH (ref 70–99)
Glucose-Capillary: 115 mg/dL — ABNORMAL HIGH (ref 70–99)

## 2011-05-03 LAB — SODIUM, URINE, RANDOM: Sodium, Ur: 74 mEq/L

## 2011-05-03 LAB — IRON AND TIBC
Saturation Ratios: 14 % — ABNORMAL LOW (ref 20–55)
UIBC: 191 ug/dL (ref 125–400)

## 2011-05-03 SURGERY — ABDOMINAL ANGIOGRAM
Anesthesia: LOCAL

## 2011-05-03 MED ORDER — PIPERACILLIN-TAZOBACTAM IN DEX 2-0.25 GM/50ML IV SOLN
2.2500 g | Freq: Three times a day (TID) | INTRAVENOUS | Status: DC
  Administered 2011-05-03 – 2011-05-06 (×10): 2.25 g via INTRAVENOUS
  Filled 2011-05-03 (×11): qty 50

## 2011-05-03 MED ORDER — HYDRALAZINE HCL 20 MG/ML IJ SOLN
10.0000 mg | Freq: Four times a day (QID) | INTRAMUSCULAR | Status: DC | PRN
  Administered 2011-05-03 – 2011-05-04 (×2): 10 mg via INTRAVENOUS
  Filled 2011-05-03 (×4): qty 0.5

## 2011-05-03 MED ORDER — AMLODIPINE BESYLATE 10 MG PO TABS
10.0000 mg | ORAL_TABLET | Freq: Every day | ORAL | Status: DC
  Administered 2011-05-04 – 2011-05-06 (×3): 10 mg via ORAL
  Filled 2011-05-03 (×3): qty 1

## 2011-05-03 NOTE — Progress Notes (Signed)
Subjective: Pt doing well but and seen by renal today with concern about creatinine going up   Objective: Vital signs in last 24 hours: Temp:  [98.6 F (37 C)-99.3 F (37.4 C)] 99.3 F (37.4 C) (04/22 0500) Pulse Rate:  [65-77] 76  (04/22 0500) Resp:  [18-19] 19  (04/22 0500) BP: (164-193)/(65-83) 179/72 mmHg (04/22 0500) SpO2:  [96 %-100 %] 100 % (04/22 0500)  Intake/Output from previous day: 04/21 0701 - 04/22 0700 In: 1320 [P.O.:1320] Out: 2075 [Urine:2075] Intake/Output this shift: Total I/O In: 485 [I.V.:485] Out: -   No results found for this basename: HGB:5 in the last 72 hours No results found for this basename: WBC:2,RBC:2,HCT:2,PLT:2 in the last 72 hours  Basename 05/02/11 0412 04/30/11 1029  NA 136 --  K 4.1 --  CL 100 --  CO2 27 --  BUN 26* --  CREATININE 3.06* 2.44*  GLUCOSE 139* --  CALCIUM 9.0 --   No results found for this basename: LABPT:2,INR:2 in the last 72 hours  wdwn male nad. eyes not dilated, not using accessory muscles of respiration, alert and oriented *3 L. Toe slightly more dusky and cellulitis stable but unchanged  Assessment/Plan: Cellulitis with dry gangrene of little toe with worsening creatinine.// will follow along but care will largely be renal and vascular for now.  Aaron Long     Tianni Escamilla L 05/03/2011, 7:43 AM

## 2011-05-03 NOTE — Progress Notes (Signed)
[facility] PROGRESS NOTE  Aaron Long ZOX:[id number] DOB: [birth year] DOA: 04/28/2011 PCP: No primary provider on file.  Assessment/Plan: 1. Dry gangrene of left fifth toe: Angiogram and possible stenting for revascularization is being contemplated pending stabilization of creatinine. Management deferred to vascular surgery. 2. Cellulitis left foot: Stop vancomycin per nephrology. Continue Zosyn. Improving by appearance. Doubt MRSA. 3. Acute renal failure/chronic kidney disease stage III-IV: Defer management to nephrology. Chronic component thought to be secondary to diabetes and possibly hypertension. ACE inhibitor discontinued. 4. Diabetic foot ulcer: Plan as above. 5. Anemia: Recheck CBC in the morning. Possibly secondary to dilution. 6. Diabetes mellitus type 2, uncontrolled: Stable as an inpatient. Hemoglobin A1c 9.1. Continue Levemir and sliding scale insulin. 7. Hypertension: Poor control. Adjust medications. Lisinopril discontinued. Increase Norvasc. Increase hydralazine tomorrow if blood pressure remains high.  Code Status: Full code Family Communication: None at bedside. Disposition Plan: Pending further evaluation and treatment  Brendia Sacks, MD  [facility] Pager [phone] 8AM-8PM  If 8PM-8AM, please contact floor/night-coverage [email or web address] Password [email or web address] 05/03/2011, 9:06 AM  LOS: 5 days   Past medical history: Diabetes mellitus type 2, hypertension, CABG, prostate cancer  Consultants:  Nephrology  Vascular surgery  Orthopedic surgery  Procedures:  ABI: Not ascertained secondary to calcification and vessel and compressibility.  Antibiotics:  Zosyn  Interim History: Afebrile. Blood pressure remains high. Blood sugars well controlled. Creatinine worse.  Subjective: No complaints. Foot is improving.  Objective: Filed Vitals:   05/02/11 2024 05/02/11 2322 05/03/11 0242 05/03/11 0500  BP: 193/68 178/65 186/83 179/72    Pulse: 77 75 72 76  Temp: 99.1 F (37.3 C)  98.6 F (37 C) 99.3 F (37.4 C)  TempSrc: Oral     Resp: 18   19  Height:      Weight:      SpO2: 100%  96% 100%    Intake/Output Summary (Last 24 hours) at 05/03/11 0906 Last data filed at 05/03/11 0714  Gross per 24 hour  Intake   1445 ml  Output   2075 ml  Net   -630 ml   Exam:   General:  Appears calm and comfortable.  Cardiovascular: Regular rate and rhythm. No murmur, rub, gallop. Minimal left foot edema.  Respiratory: Clear to auscultation bilaterally. No wheezes, rales, rhonchi. Normal respiratory effort.  Psychiatric: Grossly normal mood and affect. Speech fluent and appropriate.  Data Reviewed: Basic Metabolic Panel:  Lab 05/02/11 8295 05/02/11 0412 04/30/11 1029 04/28/11 1523  NA -- 136 -- 139  K -- 4.1 -- 5.1  CL -- 100 -- 103  CO2 -- 27 -- 28  GLUCOSE -- 139* -- 171*  BUN -- 26* -- 23  CREATININE -- 3.06* 2.44* 2.02*  CALCIUM -- 9.0 -- 9.9  MG 1.9 -- -- --  PHOS -- -- -- --   Liver Function Tests:  Lab 04/28/11 1523  AST 20  ALT 13  ALKPHOS 122*  BILITOT 0.2*  PROT 7.3  ALBUMIN 3.4*   CBC:  Lab 04/30/11 0341 04/28/11 1523  WBC 7.5 8.6  NEUTROABS 4.4 5.4  HGB 10.8* 12.5*  HCT 33.9* 38.3*  MCV 93.6 93.4  PLT 192 229   CBG:  Lab 05/03/11 0822 05/02/11 2200 05/02/11 1759 05/02/11 1637 05/02/11 1150  GLUCAP 128* 127* 128* 147* 159*   Studies: Dg Foot Complete Left  04/28/2011  *RADIOLOGY REPORT*  Clinical Data: Ulcer on the lateral aspect of the left fifth toe with swelling  LEFT FOOT - COMPLETE  3+ VIEW  Comparison: None.  Findings: The bones are diffusely osteopenic.  No focal demineralization or erosion is seen to indicate osteomyelitis. Tarsal - metatarsal alignment is normal.  Diffuse arterial calcifications are present.  IMPRESSION: No evidence of osteomyelitis.  Diffuse osteopenia with vascular calcifications present.  Original Report Authenticated By: Juline Patch, M.D.   Scheduled  Meds:   . amLODipine  5 mg Oral Daily  . aspirin EC  81 mg Oral Daily  . atorvastatin  20 mg Oral q1800  . enoxaparin  30 mg Subcutaneous Q24H  . hydrALAZINE  25 mg Oral Q8H  . insulin aspart  0-15 Units Subcutaneous TID WC  . insulin aspart  0-5 Units Subcutaneous QHS  . insulin detemir  26 Units Subcutaneous BID  . metoprolol tartrate  25 mg Oral BID  . piperacillin-tazobactam (ZOSYN)  IV  2.25 g Intravenous Q8H  . pregabalin  75 mg Oral BID  . sodium chloride  3 mL Intravenous Q12H  . DISCONTD: acetylcysteine  600 mg Oral BID  . DISCONTD: enoxaparin  40 mg Subcutaneous Q24H  . DISCONTD: lisinopril  10 mg Oral Once  . DISCONTD: piperacillin-tazobactam (ZOSYN)  IV  3.375 g Intravenous Q8H  . DISCONTD: simvastatin  40 mg Oral q1800  . DISCONTD: vancomycin  1,000 mg Intravenous Q24H   Continuous Infusions:   . sodium chloride 100 mL/hr at 05/03/11 [phone]

## 2011-05-03 NOTE — Progress Notes (Signed)
   CARE MANAGEMENT NOTE 05/03/2011  Patient:  Aaron Long, Aaron Long   Account Number:  [id number]  Date Initiated:  05/03/2011  Documentation initiated by:  Letha Cape  Subjective/Objective Assessment:   dx cellulitis, dry gangreene of little toe  admit-lives with spouse.     Action/Plan:   Anticipated DC Date:  05/07/2011   Anticipated DC Plan:  HOME/SELF CARE      DC Planning Services  CM consult      Choice offered to / List presented to:             Status of service:  In process, will continue to follow Medicare Important Message given?   (If response is "NO", the following Medicare IM given date fields will be blank) Date Medicare IM given:   Date Additional Medicare IM given:    Discharge Disposition:    Per UR Regulation:    If discussed at Long Length of Stay Meetings, dates discussed:    Comments:  05/03/11 16:06 Letha Cape RN, BSN [phone] patient lives with spouse, pta independent.  NCM will continue to follow for dc needs.

## 2011-05-03 NOTE — Progress Notes (Signed)
*  PRELIMINARY RESULTS* Vascular Ultrasound Renal Artery Duplex has been completed.   No evidence of renal artery stenosis. Limited visualization due to excessive bowel gas. Infrarenal aorta measures 3.3cm, due to limited visualization aneurysm cannot be excluded.   Malachy Moan, RDMS, RDCS 05/03/2011, 10:57 AM

## 2011-05-03 NOTE — Progress Notes (Signed)
[facility]  Subjective  - pt with less pain in foot  Objective 179/72 76 99.3 F (37.4 C) (Oral) 19 100%  Intake/Output Summary (Last 24 hours) at 05/03/11 0743 Last data filed at 05/03/11 4540  Gross per 24 hour  Intake   1805 ml  Output   2075 ml  Net   -270 ml   Left foot dry gangrene left 5th toe, erythema in foot continues to resolve still some edema and tenderness dorsally  Assessment/Planning: Left 5th toe gangrene with cellulitis Renal dysfunction still with rising creatinine Nephrology involved. Would prefer that creatinine is at least stable before proceeding to agram Will probably do most with CO2 but will still possibly need 25-50 ml contrast to optimize view of tibials Will continue to follow   Oreste Majeed E 05/03/2011 7:43 AM --  Laboratory Lab Results:BMET  Basename 05/02/11 0412 04/30/11 1029  NA 136 --  K 4.1 --  CL 100 --  CO2 27 --  GLUCOSE 139* --  BUN 26* --  CREATININE 3.06* 2.44*  CALCIUM 9.0 --   Antibiotics Anti-infectives     Start     Dose/Rate Route Frequency Ordered Stop   04/29/11 1600   vancomycin (VANCOCIN) IVPB 1000 mg/200 mL premix        1,000 mg 200 mL/hr over 60 Minutes Intravenous Every 24 hours 04/28/11 2158     04/28/11 2300  piperacillin-tazobactam (ZOSYN) IVPB 3.375 g       3.375 g 12.5 mL/hr over 240 Minutes Intravenous Every 8 hours 04/28/11 2158     04/28/11 1600   vancomycin (VANCOCIN) IVPB 1000 mg/200 mL premix        1,000 mg 200 mL/hr over 60 Minutes Intravenous  Once 04/28/11 1511 04/28/11 1736

## 2011-05-03 NOTE — Progress Notes (Signed)
[facility] ROUNDING NOTE   Subjective:   Interval History: none.  Objective:  Vital signs in last 24 hours:  Temp:  [98.6 F (37 C)-99.3 F (37.4 C)] 99.3 F (37.4 C) (04/22 0500) Pulse Rate:  [65-77] 76  (04/22 0500) Resp:  [18-19] 19  (04/22 0500) BP: (164-193)/(65-83) 179/72 mmHg (04/22 0500) SpO2:  [96 %-100 %] 100 % (04/22 0500)  Weight change:  Filed Weights   04/28/11 2135 04/28/11 2350  Weight: 92.942 kg (204 lb 14.4 oz) 92.534 kg (204 lb)    Intake/Output: I/O last 3 completed shifts: In: 1680 [P.O.:1680] Out: 2875 [Urine:2875]   Intake/Output this shift:  Total I/O In: 485 [I.V.:485] Out: -   CVS- RRR RS- CTA ABD- BS present soft non-distended EXT- no edema   Basic Metabolic Panel:  Lab 05/02/11 1610 05/02/11 0412 04/30/11 1029 04/28/11 1523  NA -- 136 -- 139  K -- 4.1 -- 5.1  CL -- 100 -- 103  CO2 -- 27 -- 28  GLUCOSE -- 139* -- 171*  BUN -- 26* -- 23  CREATININE -- 3.06* 2.44* 2.02*  CALCIUM -- 9.0 -- 9.9  MG 1.9 -- -- --  PHOS -- -- -- --    Liver Function Tests:  Lab 04/28/11 1523  AST 20  ALT 13  ALKPHOS 122*  BILITOT 0.2*  PROT 7.3  ALBUMIN 3.4*   No results found for this basename: LIPASE:5,AMYLASE:5 in the last 168 hours No results found for this basename: AMMONIA:3 in the last 168 hours  CBC:  Lab 04/30/11 0341 04/28/11 1523  WBC 7.5 8.6  NEUTROABS 4.4 5.4  HGB 10.8* 12.5*  HCT 33.9* 38.3*  MCV 93.6 93.4  PLT 192 229    Cardiac Enzymes: No results found for this basename: CKTOTAL:5,CKMB:5,CKMBINDEX:5,TROPONINI:5 in the last 168 hours  BNP: No components found with this basename: POCBNP:5  CBG:  Lab 05/03/11 0822 05/02/11 2200 05/02/11 1759 05/02/11 1637 05/02/11 1150  GLUCAP 128* 127* 128* 147* 159*    Microbiology: No results found for this or any previous visit.  Coagulation Studies: No results found for this basename: LABPROT:5,INR:5 in the last 72 hours  Urinalysis:  Basename  05/03/11 0553  COLORURINE YELLOW  LABSPEC 1.007  PHURINE 7.0  GLUCOSEU NEGATIVE  HGBUR TRACE*  BILIRUBINUR NEGATIVE  KETONESUR NEGATIVE  PROTEINUR 100*  UROBILINOGEN 0.2  NITRITE NEGATIVE  LEUKOCYTESUR NEGATIVE      Imaging: No results found.   Medications:      . sodium chloride 100 mL/hr at 05/03/11 0714      . amLODipine  5 mg Oral Daily  . aspirin EC  81 mg Oral Daily  . atorvastatin  20 mg Oral q1800  . enoxaparin  30 mg Subcutaneous Q24H  . hydrALAZINE  25 mg Oral Q8H  . insulin aspart  0-15 Units Subcutaneous TID WC  . insulin aspart  0-5 Units Subcutaneous QHS  . insulin detemir  26 Units Subcutaneous BID  . lisinopril  10 mg Oral Once  . metoprolol tartrate  25 mg Oral BID  . piperacillin-tazobactam (ZOSYN)  IV  3.375 g Intravenous Q8H  . pregabalin  75 mg Oral BID  . sodium chloride  3 mL Intravenous Q12H  . vancomycin  1,000 mg Intravenous Q24H  . DISCONTD: acetylcysteine  600 mg Oral BID  . DISCONTD: enoxaparin  40 mg Subcutaneous Q24H  . DISCONTD: simvastatin  40 mg Oral q1800   acetaminophen, hydrALAZINE, oxyCODONE, polyethylene glycol, sodium chloride, DISCONTD: sodium chloride,  DISCONTD: sodium chloride  Assessment/ Plan:   Acute renal failure serologies ordered no RBC seen on dipstick but urine positive for blood and protein. Will check CK. Ultrasound pending. Stop ACE and Vancomycin. Continue Zosyn. Consider Doxycycline or Clindamycin as alternatives  HTN/VOL-stable   LOS: 5 Aaron Long W @TODAY @8 :57 AM

## 2011-05-04 DIAGNOSIS — I129 Hypertensive chronic kidney disease with stage 1 through stage 4 chronic kidney disease, or unspecified chronic kidney disease: Secondary | ICD-10-CM | POA: Diagnosis not present

## 2011-05-04 DIAGNOSIS — E1149 Type 2 diabetes mellitus with other diabetic neurological complication: Secondary | ICD-10-CM | POA: Diagnosis not present

## 2011-05-04 DIAGNOSIS — I10 Essential (primary) hypertension: Secondary | ICD-10-CM | POA: Diagnosis not present

## 2011-05-04 DIAGNOSIS — E1129 Type 2 diabetes mellitus with other diabetic kidney complication: Secondary | ICD-10-CM | POA: Diagnosis not present

## 2011-05-04 DIAGNOSIS — I70269 Atherosclerosis of native arteries of extremities with gangrene, unspecified extremity: Secondary | ICD-10-CM | POA: Diagnosis not present

## 2011-05-04 LAB — BASIC METABOLIC PANEL
BUN: 28 mg/dL — ABNORMAL HIGH (ref 6–23)
CO2: 25 mEq/L (ref 19–32)
Calcium: 9.6 mg/dL (ref 8.4–10.5)
Chloride: 107 mEq/L (ref 96–112)
Creatinine, Ser: 3.01 mg/dL — ABNORMAL HIGH (ref 0.50–1.35)

## 2011-05-04 LAB — CBC
HCT: 34.3 % — ABNORMAL LOW (ref 39.0–52.0)
MCHC: 32.7 g/dL (ref 30.0–36.0)
MCV: 93 fL (ref 78.0–100.0)
Platelets: 234 10*3/uL (ref 150–400)
RDW: 13.3 % (ref 11.5–15.5)
WBC: 9.3 10*3/uL (ref 4.0–10.5)

## 2011-05-04 LAB — PROTEIN ELECTROPHORESIS, SERUM
Albumin ELP: 48.5 % — ABNORMAL LOW (ref 55.8–66.1)
Alpha-1-Globulin: 6.8 % — ABNORMAL HIGH (ref 2.9–4.9)
Alpha-2-Globulin: 18.1 % — ABNORMAL HIGH (ref 7.1–11.8)
Gamma Globulin: 11.9 % (ref 11.1–18.8)

## 2011-05-04 LAB — GLUCOSE, CAPILLARY
Glucose-Capillary: 139 mg/dL — ABNORMAL HIGH (ref 70–99)
Glucose-Capillary: 115 mg/dL — ABNORMAL HIGH (ref 70–99)
Glucose-Capillary: 135 mg/dL — ABNORMAL HIGH (ref 70–99)
Glucose-Capillary: 149 mg/dL — ABNORMAL HIGH (ref 70–99)

## 2011-05-04 LAB — C4 COMPLEMENT: Complement C4, Body Fluid: 48 mg/dL — ABNORMAL HIGH (ref 10–40)

## 2011-05-04 LAB — C3 COMPLEMENT: C3 Complement: 213 mg/dL — ABNORMAL HIGH (ref 90–180)

## 2011-05-04 MED ORDER — METOPROLOL TARTRATE 50 MG PO TABS
50.0000 mg | ORAL_TABLET | Freq: Two times a day (BID) | ORAL | Status: DC
  Administered 2011-05-04 – 2011-05-06 (×4): 50 mg via ORAL
  Filled 2011-05-04 (×5): qty 1

## 2011-05-04 MED ORDER — INSULIN DETEMIR 100 UNIT/ML ~~LOC~~ SOLN
15.0000 [IU] | Freq: Two times a day (BID) | SUBCUTANEOUS | Status: DC
  Administered 2011-05-04 – 2011-05-06 (×5): 15 [IU] via SUBCUTANEOUS

## 2011-05-04 NOTE — Progress Notes (Signed)
[facility] ROUNDING NOTE   Subjective:   Interval History: none.  Objective:  Vital signs in last 24 hours:  Temp:  [98.8 F (37.1 C)-99.2 F (37.3 C)] 98.8 F (37.1 C) (04/23 0628) Pulse Rate:  [63-89] 63  (04/23 0628) Resp:  [17-20] 17  (04/23 0628) BP: (139-173)/(63-71) 139/66 mmHg (04/23 0628) SpO2:  [97 %] 97 % (04/23 0628)  Weight change:  Filed Weights   04/28/11 2135 04/28/11 2350  Weight: 92.942 kg (204 lb 14.4 oz) 92.534 kg (204 lb)    Intake/Output: I/O last 3 completed shifts: In: 3010.7 [P.O.:840; I.V.:2170.7] Out: 3275 [Urine:3275]   Intake/Output this shift:     CVS- RRR RS- CTA ABD- BS present soft non-distended EXT- no edema   Basic Metabolic Panel:  Lab 05/04/11 9604 05/02/11 1935 05/02/11 0412 04/30/11 1029 04/28/11 1523  NA 142 -- 136 -- 139  K 5.1 -- 4.1 -- 5.1  CL 107 -- 100 -- 103  CO2 25 -- 27 -- 28  GLUCOSE 60* -- 139* -- 171*  BUN 28* -- 26* -- 23  CREATININE 3.01* -- 3.06* 2.44* 2.02*  CALCIUM 9.6 -- 9.0 -- 9.9  MG -- 1.9 -- -- --  PHOS -- -- -- -- --    Liver Function Tests:  Lab 04/28/11 1523  AST 20  ALT 13  ALKPHOS 122*  BILITOT 0.2*  PROT 7.3  ALBUMIN 3.4*   No results found for this basename: LIPASE:5,AMYLASE:5 in the last 168 hours No results found for this basename: AMMONIA:3 in the last 168 hours  CBC:  Lab 05/04/11 0651 04/30/11 0341 04/28/11 1523  WBC 9.3 7.5 8.6  NEUTROABS -- 4.4 5.4  HGB 11.2* 10.8* 12.5*  HCT 34.3* 33.9* 38.3*  MCV 93.0 93.6 93.4  PLT 234 192 229    Cardiac Enzymes:  Lab 05/03/11 1257  CKTOTAL 159  CKMB --  CKMBINDEX --  TROPONINI --    BNP: No components found with this basename: POCBNP:5  CBG:  Lab 05/04/11 0826 05/03/11 2134 05/03/11 1726 05/03/11 1144 05/03/11 0822  GLUCAP 60* 115* 175* 122* 128*    Microbiology: No results found for this or any previous visit.  Coagulation Studies: No results found for this basename: LABPROT:5,INR:5 in the  last 72 hours  Urinalysis:  Basename 05/03/11 0553  COLORURINE YELLOW  LABSPEC 1.007  PHURINE 7.0  GLUCOSEU NEGATIVE  HGBUR TRACE*  BILIRUBINUR NEGATIVE  KETONESUR NEGATIVE  PROTEINUR 100*  UROBILINOGEN 0.2  NITRITE NEGATIVE  LEUKOCYTESUR NEGATIVE      Imaging: US Renal  05/03/2011  *RADIOLOGY REPORT*  Clinical Data: Chronic kidney disease.  No acute renal injury. Acute renal insufficiency.  RENAL/URINARY TRACT ULTRASOUND COMPLETE  Comparison:  04/11/2005.  Findings:  Right Kidney:  12.1 cm.  Normal echotexture.  Normal central sinus echo complex.  No calculi or hydronephrosis.  Left Kidney:  11.6 cm. Normal echotexture.  Normal central sinus echo complex.  No calculi or hydronephrosis.  Bladder:  Normal.  Partially distended.  IMPRESSION: Normal renal ultrasound.  Compared to the prior exam, renal size is similar.  No interval change.  Original Report Authenticated By: Andreas Newport, M.D.     Medications:      . sodium chloride 100 mL/hr at 05/04/11 0644      . amLODipine  10 mg Oral Daily  . aspirin EC  81 mg Oral Daily  . atorvastatin  20 mg Oral q1800  . enoxaparin  30 mg Subcutaneous Q24H  . hydrALAZINE  25 mg Oral Q8H  . insulin aspart  0-15 Units Subcutaneous TID WC  . insulin aspart  0-5 Units Subcutaneous QHS  . insulin detemir  26 Units Subcutaneous BID  . metoprolol tartrate  25 mg Oral BID  . piperacillin-tazobactam (ZOSYN)  IV  2.25 g Intravenous Q8H  . pregabalin  75 mg Oral BID  . sodium chloride  3 mL Intravenous Q12H  . DISCONTD: amLODipine  5 mg Oral Daily  . DISCONTD: lisinopril  10 mg Oral Once  . DISCONTD: piperacillin-tazobactam (ZOSYN)  IV  3.375 g Intravenous Q8H  . DISCONTD: vancomycin  1,000 mg Intravenous Q24H   acetaminophen, hydrALAZINE, oxyCODONE, polyethylene glycol, sodium chloride  Assessment/ Plan:  Acute renal failure serologies ordered no RBC seen on dipstick but urine positive for blood and protein. . Stopped ACE and  Vancomycin. Continue Zosyn. Consider Doxycycline or Clindamycin as alternatives. Renal function better HTN/VOL-stable   LOS: 6 Aaron Long W @TODAY @8 :39 AM

## 2011-05-04 NOTE — Progress Notes (Signed)
[facility] PROGRESS NOTE  Aaron Long AVW:[id number] DOB: [birth year] DOA: 04/28/2011 PCP: No primary provider on file.  Assessment/Plan: 1. Dry gangrene of left fifth toe: Angiogram and possible stenting for revascularization is being contemplated pending stabilization of creatinine. Management deferred to vascular surgery. 2. Cellulitis left foot: Stop vancomycin per nephrology. Continue Zosyn. Improving by appearance. Doubt MRSA. 3. Acute renal failure/chronic kidney disease stage III-IV: Defer management to nephrology. Chronic component thought to be secondary to diabetes and possibly hypertension. ACE inhibitor discontinued. 4. Diabetic foot ulcer: Plan as above. 5. Anemia: Stable. Likely secondary to acute disease.  6. Diabetes mellitus type 2, uncontrolled: Hypoglycemia this morning. Decrease Levemir and sliding scale insulin. Hemoglobin A1c 9.1. 7. Hypertension: Poor control. Adjust medications. Lisinopril discontinued.  Code Status: Full code Family Communication: None at bedside. Disposition Plan: Pending further evaluation and treatment  Brendia Sacks, MD  [facility] Pager [phone] 8AM-8PM  If 8PM-8AM, please contact floor/night-coverage [email or web address] Password [email or web address] 05/04/2011, 10:45 AM  LOS: 6 days   Past medical history: Diabetes mellitus type 2, hypertension, CABG, prostate cancer  Consultants:  Nephrology  Vascular surgery  Orthopedic surgery  Procedures:  ABI: Not ascertained secondary to calcification and vessel and compressibility.  Antibiotics:  Zosyn  Interim History: Afebrile. Blood pressure remains high. Blood sugars well controlled. Creatinine stable.  Subjective: No complaints.   Objective: Filed Vitals:   05/03/11 0500 05/03/11 1300 05/03/11 2100 05/04/11 0628  BP: 179/72 160/63 173/71 139/66  Pulse: 76 72 89 63  Temp: 99.3 F (37.4 C) 98.8 F (37.1 C) 99.2 F (37.3 C) 98.8 F (37.1 C)    TempSrc:  Oral Oral Oral  Resp: 19 20 20 17   Height:      Weight:      SpO2: 100% 97% 97% 97%    Intake/Output Summary (Last 24 hours) at 05/04/11 1045 Last data filed at 05/04/11 0644  Gross per 24 hour  Intake 2165.67 ml  Output   1700 ml  Net 465.67 ml   Exam:   General:  Appears calm and comfortable.  Cardiovascular: Regular rate and rhythm. No murmur, rub, gallop. Minimal left foot edema.  Respiratory: Clear to auscultation bilaterally. No wheezes, rales, rhonchi. Normal respiratory effort.  Psychiatric: Grossly normal mood and affect. Speech fluent and appropriate.  Data Reviewed: Basic Metabolic Panel:  Lab 05/04/11 3086 05/02/11 1935 05/02/11 0412 04/30/11 1029 04/28/11 1523  NA 142 -- 136 -- 139  K 5.1 -- 4.1 -- --  CL 107 -- 100 -- 103  CO2 25 -- 27 -- 28  GLUCOSE 60* -- 139* -- 171*  BUN 28* -- 26* -- 23  CREATININE 3.01* -- 3.06* 2.44* 2.02*  CALCIUM 9.6 -- 9.0 -- 9.9  MG -- 1.9 -- -- --  PHOS -- -- -- -- --   Liver Function Tests:  Lab 04/28/11 1523  AST 20  ALT 13  ALKPHOS 122*  BILITOT 0.2*  PROT 7.3  ALBUMIN 3.4*   CBC:  Lab 05/04/11 0651 04/30/11 0341 04/28/11 1523  WBC 9.3 7.5 8.6  NEUTROABS -- 4.4 5.4  HGB 11.2* 10.8* 12.5*  HCT 34.3* 33.9* 38.3*  MCV 93.0 93.6 93.4  PLT 234 192 229   CBG:  Lab 05/04/11 0826 05/03/11 2134 05/03/11 1726 05/03/11 1144 05/03/11 0822  GLUCAP 60* 115* 175* 122* 128*   Studies: Dg Foot Complete Left  04/28/2011  *RADIOLOGY REPORT*  Clinical Data: Ulcer on the lateral aspect of the left fifth toe with swelling  LEFT FOOT - COMPLETE 3+ VIEW  Comparison: None.  Findings: The bones are diffusely osteopenic.  No focal demineralization or erosion is seen to indicate osteomyelitis. Tarsal - metatarsal alignment is normal.  Diffuse arterial calcifications are present.  IMPRESSION: No evidence of osteomyelitis.  Diffuse osteopenia with vascular calcifications present.  Original Report Authenticated By: Juline Patch, M.D.   Scheduled Meds:    . amLODipine  10 mg Oral Daily  . aspirin EC  81 mg Oral Daily  . atorvastatin  20 mg Oral q1800  . enoxaparin  30 mg Subcutaneous Q24H  . hydrALAZINE  25 mg Oral Q8H  . insulin aspart  0-15 Units Subcutaneous TID WC  . insulin aspart  0-5 Units Subcutaneous QHS  . insulin detemir  15 Units Subcutaneous BID  . metoprolol tartrate  25 mg Oral BID  . piperacillin-tazobactam (ZOSYN)  IV  2.25 g Intravenous Q8H  . pregabalin  75 mg Oral BID  . sodium chloride  3 mL Intravenous Q12H  . DISCONTD: amLODipine  5 mg Oral Daily  . DISCONTD: insulin detemir  26 Units Subcutaneous BID   Continuous Infusions:    . sodium chloride 100 mL/hr at 05/04/11 [phone]

## 2011-05-04 NOTE — Progress Notes (Signed)
CRITICAL VALUE ALERT  Critical value received:  0826  Date of notification:  4/23  Time of notification:  0830  Critical value read back:yes   Nurse who received alert:  Salome Holmes   MD notified (1st page):  Dr. Irene Limbo   Time of first page:  [phone]  MD notified (2nd page):  Time of second page:  Responding MD:  Irene Limbo   Time MD responded:  [phone]

## 2011-05-04 NOTE — Progress Notes (Signed)
Utilization review complete 

## 2011-05-04 NOTE — Progress Notes (Signed)
ANTIBIOTIC CONSULT NOTE - FOLLOW UP  Pharmacy Consult for Zosyn Indication: Foot ulcer/diabetic  No Known Allergies  Patient Measurements: Height: 5\' 8"  (172.7 cm) Weight: 204 lb (92.534 kg) IBW/kg (Calculated) : 68.4   Vital Signs: Temp: 98.8 F (37.1 C) (04/23 0628) Temp src: Oral (04/23 0628) BP: 139/66 mmHg (04/23 0628) Pulse Rate: 63  (04/23 0628) Intake/Output from previous day: 04/22 0701 - 04/23 0700 In: 3010.7 [P.O.:840; I.V.:2170.7] Out: 1700 [Urine:1700] Intake/Output from this shift:    Labs:  Basename 05/04/11 0651 05/03/11 0553 05/02/11 0412  WBC 9.3 -- --  HGB 11.2* -- --  PLT 234 -- --  LABCREA -- 39.04 --  CREATININE 3.01* -- 3.06*   Estimated Creatinine Clearance: 24.8 ml/min (by C-G formula based on Cr of 3.01).  Basename 05/01/11 1518  VANCOTROUGH 13.0  VANCOPEAK --  VANCORANDOM --  GENTTROUGH --  GENTPEAK --  GENTRANDOM --  TOBRATROUGH --  TOBRAPEAK --  TOBRARND --  AMIKACINPEAK --  AMIKACINTROU --  AMIKACIN --     Microbiology: No results found for this or any previous visit (from the past 720 hour(s)).  Anti-infectives     Start     Dose/Rate Route Frequency Ordered Stop   05/03/11 1000   piperacillin-tazobactam (ZOSYN) IVPB 2.25 g        2.25 g 100 mL/hr over 30 Minutes Intravenous Every 8 hours 05/03/11 0858     04/29/11 1600   vancomycin (VANCOCIN) IVPB 1000 mg/200 mL premix  Status:  Discontinued        1,000 mg 200 mL/hr over 60 Minutes Intravenous Every 24 hours 04/28/11 2158 05/03/11 0858   04/28/11 2300   piperacillin-tazobactam (ZOSYN) IVPB 3.375 g  Status:  Discontinued        3.375 g 12.5 mL/hr over 240 Minutes Intravenous Every 8 hours 04/28/11 2158 05/03/11 0858   04/28/11 1600   vancomycin (VANCOCIN) IVPB 1000 mg/200 mL premix        1,000 mg 200 mL/hr over 60 Minutes Intravenous  Once 04/28/11 1511 04/28/11 1736          Assessment:  71 yo male with dry gangrene L fifth toe. Vascular consult  requested. No osteomyelitis noted.  No cultures ordered.  Hx DM, Hgb A1c 9.1; indicative of poor control.  Zosyn changed per MD 4/22 for worsening renal function.  Goal of Therapy:  Eradication of infection  Plan:   Cont zosyn as per MD  Consider changing back to 3.375 gm IV q8 hours as estimated CrCl >20 ml/min as per pharmacy protocol.  Woodfin Ganja PharmD Pager [phone] 05/04/2011,9:49 AM

## 2011-05-05 ENCOUNTER — Inpatient Hospital Stay (HOSPITAL_COMMUNITY): Payer: Medicare Other

## 2011-05-05 DIAGNOSIS — E1149 Type 2 diabetes mellitus with other diabetic neurological complication: Secondary | ICD-10-CM | POA: Diagnosis not present

## 2011-05-05 DIAGNOSIS — I129 Hypertensive chronic kidney disease with stage 1 through stage 4 chronic kidney disease, or unspecified chronic kidney disease: Secondary | ICD-10-CM | POA: Diagnosis not present

## 2011-05-05 DIAGNOSIS — R05 Cough: Secondary | ICD-10-CM | POA: Diagnosis not present

## 2011-05-05 DIAGNOSIS — L97509 Non-pressure chronic ulcer of other part of unspecified foot with unspecified severity: Secondary | ICD-10-CM | POA: Diagnosis not present

## 2011-05-05 DIAGNOSIS — I10 Essential (primary) hypertension: Secondary | ICD-10-CM | POA: Diagnosis not present

## 2011-05-05 DIAGNOSIS — R42 Dizziness and giddiness: Secondary | ICD-10-CM | POA: Diagnosis not present

## 2011-05-05 DIAGNOSIS — E1129 Type 2 diabetes mellitus with other diabetic kidney complication: Secondary | ICD-10-CM | POA: Diagnosis not present

## 2011-05-05 DIAGNOSIS — I70269 Atherosclerosis of native arteries of extremities with gangrene, unspecified extremity: Secondary | ICD-10-CM | POA: Diagnosis not present

## 2011-05-05 LAB — UIFE/LIGHT CHAINS/TP QN, 24-HR UR
Albumin, U: DETECTED
Free Kappa/Lambda Ratio: 3.23 ratio (ref 2.04–10.37)
Free Lambda Lt Chains,Ur: 3.62 mg/dL — ABNORMAL HIGH (ref 0.02–0.67)
Total Protein, Urine: 54.7 mg/dL

## 2011-05-05 LAB — BASIC METABOLIC PANEL
Calcium: 9.2 mg/dL (ref 8.4–10.5)
GFR calc Af Amer: 23 mL/min — ABNORMAL LOW (ref >90)
GFR calc non Af Amer: 20 mL/min — ABNORMAL LOW (ref >90)
Potassium: 4.8 mEq/L (ref 3.5–5.1)
Sodium: 139 mEq/L (ref 135–145)

## 2011-05-05 LAB — GLUCOSE, CAPILLARY: Glucose-Capillary: 105 mg/dL — ABNORMAL HIGH (ref 70–99)

## 2011-05-05 MED ORDER — LINEZOLID 2 MG/ML IV SOLN
600.0000 mg | Freq: Two times a day (BID) | INTRAVENOUS | Status: DC
  Administered 2011-05-05 – 2011-05-06 (×2): 600 mg via INTRAVENOUS
  Filled 2011-05-05 (×5): qty 300

## 2011-05-05 MED ORDER — SODIUM CHLORIDE 0.9 % IV SOLN
INTRAVENOUS | Status: DC
  Administered 2011-05-05 – 2011-05-06 (×2): via INTRAVENOUS

## 2011-05-05 NOTE — Progress Notes (Signed)
Patient Demographics  Aaron Long, is a 71 y.o. male  MVH:[id number]  WUX:[id number]  DOB - 03-30-1940  04/28/2011  Admitting Physician Standley Brooking, MD  Outpatient Primary MD for the patient is No primary provider on file.  LOS - 7     No chief complaint on file.       Subjective:   Aaron Long today has, No headache, No chest pain, No abdominal pain - No Nausea, No new weakness tingling or numbness, No Cough - SOB.    Objective:   Filed Vitals:   05/05/11 0340 05/05/11 0555 05/05/11 1058 05/05/11 1513  BP: 156/75 135/58 159/67 138/61  Pulse: 88 74 58 63  Temp: 98.8 F (37.1 C) 98.6 F (37 C) 98.2 F (36.8 C) 98.1 F (36.7 C)  TempSrc:      Resp: 16 16 18 16   Height:      Weight:      SpO2: 98% 100% 98% 97%    Wt Readings from Last 3 Encounters:  04/28/11 92.534 kg (204 lb)  04/28/11 92.534 kg (204 lb)     Intake/Output Summary (Last 24 hours) at 05/05/11 1521 Last data filed at 05/05/11 0836  Gross per 24 hour  Intake   1940 ml  Output    900 ml  Net   1040 ml    Exam Awake Alert, Oriented *3, No new F.N deficits, Normal affect [place].AT,PERRAL Supple Neck,No JVD, No cervical lymphadenopathy appriciated.  Symmetrical Chest wall movement, Good air movement bilaterally, CTAB RRR,No Gallops,Rubs or new Murmurs, No Parasternal Heave +ve B.Sounds, Abd Soft, Non tender, No organomegaly appriciated, No rebound -guarding or rigidity. No Cyanosis, Clubbing or edema, No new Rash or bruise. L foot and 5th toe swollen, small ulcer on lateral aspect of the 5th toe.  Data Review  CBC  Lab 05/04/11 0651 04/30/11 0341 04/28/11 1523  WBC 9.3 7.5 8.6  HGB 11.2* 10.8* 12.5*  HCT 34.3* 33.9* 38.3*  PLT 234 192 229  MCV 93.0 93.6 93.4  MCH 30.4 29.8 30.5  MCHC 32.7 31.9 32.6  RDW 13.3 13.2 13.3  LYMPHSABS -- 2.3 2.3  MONOABS -- 0.6 0.7  EOSABS -- 0.2 0.1  BASOSABS -- 0.1 0.0  BANDABS -- -- --    Chemistries   Lab 05/05/11 0505 05/04/11 0651  05/02/11 1935 05/02/11 0412 04/30/11 1029 04/28/11 1523  NA 139 142 -- 136 -- 139  K 4.8 5.1 -- 4.1 -- 5.1  CL 105 107 -- 100 -- 103  CO2 26 25 -- 27 -- 28  GLUCOSE 125* 60* -- 139* -- 171*  BUN 26* 28* -- 26* -- 23  CREATININE 2.97* 3.01* -- 3.06* 2.44* 2.02*  CALCIUM 9.2 9.6 -- 9.0 -- 9.9  MG -- -- 1.9 -- -- --  AST -- -- -- -- -- 20  ALT -- -- -- -- -- 13  ALKPHOS -- -- -- -- -- 122*  BILITOT -- -- -- -- -- 0.2*   ------------------------------------------------------------------------------------------------------------------ estimated creatinine clearance is 25.2 ml/min (by C-G formula based on Cr of 2.97). ------------------------------------------------------------------------------------------------------------------ No results found for this basename: HGBA1C:2 in the last 72 hours ------------------------------------------------------------------------------------------------------------------ No results found for this basename: CHOL:2,HDL:2,LDLCALC:2,TRIG:2,CHOLHDL:2,LDLDIRECT:2 in the last 72 hours ------------------------------------------------------------------------------------------------------------------ No results found for this basename: TSH,T4TOTAL,FREET3,T3FREE,THYROIDAB in the last 72 hours ------------------------------------------------------------------------------------------------------------------  [facility] 05/02/11 1935  VITAMINB12 --  FOLATE --  FERRITIN --  TIBC 223  IRON 32*  RETICCTPCT --    Coagulation profile No results found for this basename: INR:5,PROTIME:5  in the last 168 hours  No results found for this basename: DDIMER:2 in the last 72 hours  Cardiac Enzymes No results found for this basename: CK:3,CKMB:3,TROPONINI:3,MYOGLOBIN:3 in the last 168 hours ------------------------------------------------------------------------------------------------------------------ No components found with this basename: POCBNP:3  Micro Results No  results found for this or any previous visit (from the past 240 hour(s)).  Radiology Reports US Renal  05/03/2011  *RADIOLOGY REPORT*  Clinical Data: Chronic kidney disease.  No acute renal injury. Acute renal insufficiency.  RENAL/URINARY TRACT ULTRASOUND COMPLETE  Comparison:  04/11/2005.  Findings:  Right Kidney:  12.1 cm.  Normal echotexture.  Normal central sinus echo complex.  No calculi or hydronephrosis.  Left Kidney:  11.6 cm. Normal echotexture.  Normal central sinus echo complex.  No calculi or hydronephrosis.  Bladder:  Normal.  Partially distended.  IMPRESSION: Normal renal ultrasound.  Compared to the prior exam, renal size is similar.  No interval change.  Original Report Authenticated By: Andreas Newport, M.D.   Dg Foot Complete Left  04/28/2011  *RADIOLOGY REPORT*  Clinical Data: Ulcer on the lateral aspect of the left fifth toe with swelling  LEFT FOOT - COMPLETE 3+ VIEW  Comparison: None.  Findings: The bones are diffusely osteopenic.  No focal demineralization or erosion is seen to indicate osteomyelitis. Tarsal - metatarsal alignment is normal.  Diffuse arterial calcifications are present.  IMPRESSION: No evidence of osteomyelitis.  Diffuse osteopenia with vascular calcifications present.  Original Report Authenticated By: Juline Patch, M.D.    Scheduled Meds:   . amLODipine  10 mg Oral Daily  . aspirin EC  81 mg Oral Daily  . atorvastatin  20 mg Oral q1800  . enoxaparin  30 mg Subcutaneous Q24H  . hydrALAZINE  25 mg Oral Q8H  . insulin aspart  0-15 Units Subcutaneous TID WC  . insulin aspart  0-5 Units Subcutaneous QHS  . insulin detemir  15 Units Subcutaneous BID  . linezolid  600 mg Intravenous Q12H  . metoprolol tartrate  50 mg Oral BID  . piperacillin-tazobactam (ZOSYN)  IV  2.25 g Intravenous Q8H  . pregabalin  75 mg Oral BID  . sodium chloride  3 mL Intravenous Q12H  . DISCONTD: metoprolol tartrate  25 mg Oral BID   Continuous Infusions:   . sodium chloride      . DISCONTD: sodium chloride 100 mL/hr at 05/05/11 1253   PRN Meds:.acetaminophen, hydrALAZINE, oxyCODONE, polyethylene glycol, sodium chloride  Assessment & Plan    1. Dry gangrene of left fifth toe: Angiogram and possible stenting for revascularization is being contemplated pending stabilization of creatinine. Management deferred to vascular surgery.Renal following, ABX-Zyvox-Zosyn.    2. Cellulitis left foot: Stopped vancomycin per nephrology. Continue Zosyn-Zyvox + as #1.    3. Acute renal failure/chronic kidney disease stage III-IV: Defer management to nephrology. Chronic component thought to be secondary to diabetes and possibly hypertension. ACE inhibitor discontinued. Note baseline Creat is around 2.   4. Diabetic foot ulcer: Plan as above.   5. Anemia: Stable. Likely secondary to acute disease.    6. Diabetes mellitus type 2, uncontrolled: continue Levemir and sliding scale insulin. Hemoglobin A1c 9.1.    CBG (last 3)   Basename 05/05/11 1227 05/05/11 0807 05/04/11 2235  GLUCAP 105* 144* 149*     7. Hypertension: stable, continue present medications. Lisinopril discontinued due to # 3.    Past medical history:   Diabetes mellitus type 2, hypertension, CABG, prostate cancer    Consultants:  Nephrology  Vascular  surgery  Orthopedic surgery   Procedures:  ABI: Not ascertained secondary to calcification and vessel and compressibility.   Antibiotics:  Zosyn -4-17 Zyvox - 4-24 Vanco- 4-17 to 4-22  DVT Prophylaxis  Lovenox       Leroy Sea M.D on 05/05/2011 at 3:21 PM  [facility] Office  [phone]

## 2011-05-05 NOTE — Progress Notes (Signed)
Awaiting stabilization of renal function Potentially Agram Friday  Fabienne Bruns, MD [facility] Office: [phone] Pager: [phone]

## 2011-05-05 NOTE — Progress Notes (Signed)
[facility] ROUNDING NOTE   Subjective:   Interval History: none.  Objective:  Vital signs in last 24 hours:  Temp:  [98.2 F (36.8 C)-99 F (37.2 C)] 98.6 F (37 C) (04/24 0555) Pulse Rate:  [64-88] 74  (04/24 0555) Resp:  [16-20] 16  (04/24 0555) BP: (135-174)/(58-75) 135/58 mmHg (04/24 0555) SpO2:  [96 %-100 %] 100 % (04/24 0555)  Weight change:  Filed Weights   04/28/11 2135 04/28/11 2350  Weight: 92.942 kg (204 lb 14.4 oz) 92.534 kg (204 lb)    Intake/Output: I/O last 3 completed shifts: In: 4781.7 [P.O.:1680; I.V.:2801.7; IV Piggyback:300] Out: 2550 [Urine:2550]   Intake/Output this shift:     CVS- RRR RS- CTA ABD- BS present soft non-distended EXT- no edema   Basic Metabolic Panel:  Lab 05/05/11 1610 05/04/11 0651 05/02/11 1935 05/02/11 0412 04/30/11 1029 04/28/11 1523  NA 139 142 -- 136 -- 139  K 4.8 5.1 -- 4.1 -- 5.1  CL 105 107 -- 100 -- 103  CO2 26 25 -- 27 -- 28  GLUCOSE 125* 60* -- 139* -- 171*  BUN 26* 28* -- 26* -- 23  CREATININE 2.97* 3.01* -- 3.06* 2.44* 2.02*  CALCIUM 9.2 9.6 -- 9.0 -- --  MG -- -- 1.9 -- -- --  PHOS -- -- -- -- -- --    Liver Function Tests:  Lab 04/28/11 1523  AST 20  ALT 13  ALKPHOS 122*  BILITOT 0.2*  PROT 7.3  ALBUMIN 3.4*   No results found for this basename: LIPASE:5,AMYLASE:5 in the last 168 hours No results found for this basename: AMMONIA:3 in the last 168 hours  CBC:  Lab 05/04/11 0651 04/30/11 0341 04/28/11 1523  WBC 9.3 7.5 8.6  NEUTROABS -- 4.4 5.4  HGB 11.2* 10.8* 12.5*  HCT 34.3* 33.9* 38.3*  MCV 93.0 93.6 93.4  PLT 234 192 229    Cardiac Enzymes:  Lab 05/03/11 1257  CKTOTAL 159  CKMB --  CKMBINDEX --  TROPONINI --    BNP: No components found with this basename: POCBNP:5  CBG:  Lab 05/05/11 0807 05/04/11 2235 05/04/11 1732 05/04/11 1240 05/04/11 0938  GLUCAP 144* 149* 135* 115* 139*    Microbiology: No results found for this or any previous  visit.  Coagulation Studies: No results found for this basename: LABPROT:5,INR:5 in the last 72 hours  Urinalysis:  Basename 05/03/11 0553  COLORURINE YELLOW  LABSPEC 1.007  PHURINE 7.0  GLUCOSEU NEGATIVE  HGBUR TRACE*  BILIRUBINUR NEGATIVE  KETONESUR NEGATIVE  PROTEINUR 100*  UROBILINOGEN 0.2  NITRITE NEGATIVE  LEUKOCYTESUR NEGATIVE      Imaging: US Renal  05/03/2011  *RADIOLOGY REPORT*  Clinical Data: Chronic kidney disease.  No acute renal injury. Acute renal insufficiency.  RENAL/URINARY TRACT ULTRASOUND COMPLETE  Comparison:  04/11/2005.  Findings:  Right Kidney:  12.1 cm.  Normal echotexture.  Normal central sinus echo complex.  No calculi or hydronephrosis.  Left Kidney:  11.6 cm. Normal echotexture.  Normal central sinus echo complex.  No calculi or hydronephrosis.  Bladder:  Normal.  Partially distended.  IMPRESSION: Normal renal ultrasound.  Compared to the prior exam, renal size is similar.  No interval change.  Original Report Authenticated By: Andreas Newport, M.D.     Medications:      . sodium chloride 100 mL/hr at 05/05/11 0003      . amLODipine  10 mg Oral Daily  . aspirin EC  81 mg Oral Daily  . atorvastatin  20  mg Oral q1800  . enoxaparin  30 mg Subcutaneous Q24H  . hydrALAZINE  25 mg Oral Q8H  . insulin aspart  0-15 Units Subcutaneous TID WC  . insulin aspart  0-5 Units Subcutaneous QHS  . insulin detemir  15 Units Subcutaneous BID  . metoprolol tartrate  50 mg Oral BID  . piperacillin-tazobactam (ZOSYN)  IV  2.25 g Intravenous Q8H  . pregabalin  75 mg Oral BID  . sodium chloride  3 mL Intravenous Q12H  . DISCONTD: insulin detemir  26 Units Subcutaneous BID  . DISCONTD: metoprolol tartrate  25 mg Oral BID   acetaminophen, hydrALAZINE, oxyCODONE, polyethylene glycol, sodium chloride  Assessment/ Plan:  Acute renal failure serologies ordered no RBC seen on dipstick but urine positive for blood and protein. . Stopped ACE and Vancomycin. Continue  Zosyn. Consider Doxycycline or Clindamycin as alternatives. Renal function better  HTN/VOL-stable  ARF slowly resolving will follow   LOS: 7 Benton Tooker W @TODAY @8 :22 AM

## 2011-05-05 NOTE — Progress Notes (Signed)
36 Patient's wife came up to the desk and stated that patient had fallen. Went in to patient's room and patient was on th commode. Wife stated that patient insisted on her helping him get up and would not let her come and get [place] until she did. Patient alert oriented x4 . Patient stated he woke up was getting OOB and started to void in the floor and thought he could make it to the toilet without assistance. Patient stated he started slipping on the wet floor and slid to the floor. He stated"  I did not fall". Patient denied hitting his head and denies any pain. Patient assisted back to bed from the bathroom, T98.8 P88 R16 B/P 156/75 O2 sat 98 on RA. No injuries noted . Donnamarie Poag notified at 0410 no orders received. Huddle done at the bedside with patient and wife. Patient was told he would call for assistance and will be on a bedalarm until d/c. Patient and wife agreed with the safety plan of care. Bed alarm on. Will continue to monitor for safety.

## 2011-05-06 ENCOUNTER — Telehealth: Payer: Self-pay | Admitting: Vascular Surgery

## 2011-05-06 DIAGNOSIS — I129 Hypertensive chronic kidney disease with stage 1 through stage 4 chronic kidney disease, or unspecified chronic kidney disease: Secondary | ICD-10-CM | POA: Diagnosis not present

## 2011-05-06 DIAGNOSIS — E1149 Type 2 diabetes mellitus with other diabetic neurological complication: Secondary | ICD-10-CM | POA: Diagnosis not present

## 2011-05-06 DIAGNOSIS — E1129 Type 2 diabetes mellitus with other diabetic kidney complication: Secondary | ICD-10-CM | POA: Diagnosis not present

## 2011-05-06 DIAGNOSIS — L97509 Non-pressure chronic ulcer of other part of unspecified foot with unspecified severity: Secondary | ICD-10-CM | POA: Diagnosis not present

## 2011-05-06 DIAGNOSIS — I70269 Atherosclerosis of native arteries of extremities with gangrene, unspecified extremity: Secondary | ICD-10-CM | POA: Diagnosis not present

## 2011-05-06 DIAGNOSIS — I10 Essential (primary) hypertension: Secondary | ICD-10-CM | POA: Diagnosis not present

## 2011-05-06 LAB — BASIC METABOLIC PANEL
BUN: 26 mg/dL — ABNORMAL HIGH (ref 6–23)
CO2: 24 mEq/L (ref 19–32)
Calcium: 9.3 mg/dL (ref 8.4–10.5)
Creatinine, Ser: 2.74 mg/dL — ABNORMAL HIGH (ref 0.50–1.35)
Glucose, Bld: 167 mg/dL — ABNORMAL HIGH (ref 70–99)

## 2011-05-06 LAB — CBC
MCH: 30.1 pg (ref 26.0–34.0)
MCHC: 31.9 g/dL (ref 30.0–36.0)
MCV: 94.4 fL (ref 78.0–100.0)
Platelets: 235 10*3/uL (ref 150–400)
RDW: 13.1 % (ref 11.5–15.5)
WBC: 7.5 10*3/uL (ref 4.0–10.5)

## 2011-05-06 MED ORDER — OXYCODONE HCL 5 MG PO TABS: 5.0000 mg | ORAL_TABLET | Freq: Four times a day (QID) | ORAL | Status: AC | PRN

## 2011-05-06 MED ORDER — AMOXICILLIN-POT CLAVULANATE 500-125 MG PO TABS: 1.0000 | ORAL_TABLET | Freq: Three times a day (TID) | ORAL | Status: AC

## 2011-05-06 MED ORDER — DOXYCYCLINE HYCLATE 50 MG PO CAPS: 50.0000 mg | ORAL_CAPSULE | Freq: Two times a day (BID) | ORAL | Status: AC

## 2011-05-06 MED ORDER — SODIUM CHLORIDE 0.45 % IV SOLN
INTRAVENOUS | Status: DC
  Administered 2011-05-06: 09:00:00 via INTRAVENOUS

## 2011-05-06 MED ORDER — METOPROLOL TARTRATE 25 MG PO TABS: 50.0000 mg | ORAL_TABLET | Freq: Two times a day (BID) | ORAL | Status: DC

## 2011-05-06 MED ORDER — AMLODIPINE BESYLATE 10 MG PO TABS: 10.0000 mg | ORAL_TABLET | Freq: Every day | ORAL | Status: DC

## 2011-05-06 MED ORDER — STERILE WATER FOR INJECTION IV SOLN: INTRAVENOUS | Status: DC

## 2011-05-06 MED ORDER — SACCHAROMYCES BOULARDII 250 MG PO CAPS: 250.0000 mg | ORAL_CAPSULE | Freq: Two times a day (BID) | ORAL | Status: AC

## 2011-05-06 NOTE — Progress Notes (Signed)
[facility] ROUNDING NOTE   Subjective:   Interval History: none.  Objective:  Vital signs in last 24 hours:  Temp:  [98.1 F (36.7 C)-98.4 F (36.9 C)] 98.4 F (36.9 C) (04/25 0530) Pulse Rate:  [58-74] 74  (04/25 0530) Resp:  [16-20] 20  (04/25 0530) BP: (138-194)/(57-91) 162/82 mmHg (04/25 0650) SpO2:  [97 %-99 %] 98 % (04/25 0530)  Weight change:  Filed Weights   04/28/11 2135 04/28/11 2350  Weight: 92.942 kg (204 lb 14.4 oz) 92.534 kg (204 lb)    Intake/Output: I/O last 3 completed shifts: In: 4338 [P.O.:960; I.V.:2628; IV Piggyback:750] Out: 1800 [Urine:1800]   Intake/Output this shift:     CVS- RRR RS- CTA ABD- BS present soft non-distended EXT- no edema   Basic Metabolic Panel:  Lab 05/06/11 1610 05/05/11 0505 05/04/11 0651 05/02/11 1935 05/02/11 0412 04/30/11 1029  NA 138 139 142 -- 136 --  K 5.0 4.8 5.1 -- 4.1 --  CL 105 105 107 -- 100 --  CO2 24 26 25  -- 27 --  GLUCOSE 167* 125* 60* -- 139* --  BUN 26* 26* 28* -- 26* --  CREATININE 2.74* 2.97* 3.01* -- 3.06* 2.44*  CALCIUM 9.3 9.2 9.6 -- -- --  MG -- -- -- 1.9 -- --  PHOS -- -- -- -- -- --    Liver Function Tests: No results found for this basename: AST:5,ALT:5,ALKPHOS:5,BILITOT:5,PROT:5,ALBUMIN:5 in the last 168 hours No results found for this basename: LIPASE:5,AMYLASE:5 in the last 168 hours No results found for this basename: AMMONIA:3 in the last 168 hours  CBC:  Lab 05/06/11 0704 05/04/11 0651 04/30/11 0341  WBC 7.5 9.3 7.5  NEUTROABS -- -- 4.4  HGB 10.7* 11.2* 10.8*  HCT 33.5* 34.3* 33.9*  MCV 94.4 93.0 93.6  PLT 235 234 192    Cardiac Enzymes:  Lab 05/03/11 1257  CKTOTAL 159  CKMB --  CKMBINDEX --  TROPONINI --    BNP: No components found with this basename: POCBNP:5  CBG:  Lab 05/06/11 0801 05/05/11 2225 05/05/11 1721 05/05/11 1227 05/05/11 0807  GLUCAP 160* 185* 105* 105* 144*    Microbiology: No results found for this or any previous  visit.  Coagulation Studies: No results found for this basename: LABPROT:5,INR:5 in the last 72 hours  Urinalysis: No results found for this basename: COLORURINE:2,APPERANCEUR:2,LABSPEC:2,PHURINE:2,GLUCOSEU:2,HGBUR:2,BILIRUBINUR:2,KETONESUR:2,PROTEINUR:2,UROBILINOGEN:2,NITRITE:2,LEUKOCYTESUR:2 in the last 72 hours    Imaging: Dg Chest 2 View  05/05/2011  *RADIOLOGY REPORT*  Clinical Data: Cough and dizziness  CHEST - 2 VIEW  Comparison: 11/25/2010  Findings: Prior median sternotomy and CABG procedure.  No airspace consolidation identified.  The interstitial coarsening is noted bilaterally.  Slightly decreased lung volumes compared with previous exam.  Visualized osseous structures are unremarkable.  IMPRESSION:  1.  No acute cardiopulmonary abnormalities.  Original Report Authenticated By: Rosealee Albee, M.D.     Medications:      . sodium chloride    . sodium chloride 75 mL/hr at 05/06/11 0228  . DISCONTD: sodium chloride 100 mL/hr at 05/05/11 1253      . amLODipine  10 mg Oral Daily  . aspirin EC  81 mg Oral Daily  . atorvastatin  20 mg Oral q1800  . enoxaparin  30 mg Subcutaneous Q24H  . hydrALAZINE  25 mg Oral Q8H  . insulin aspart  0-15 Units Subcutaneous TID WC  . insulin aspart  0-5 Units Subcutaneous QHS  . insulin detemir  15 Units Subcutaneous BID  . linezolid  600  mg Intravenous Q12H  . metoprolol tartrate  50 mg Oral BID  . piperacillin-tazobactam (ZOSYN)  IV  2.25 g Intravenous Q8H  . pregabalin  75 mg Oral BID  .  sodium bicarbonate infusion sterile water 1000 mL   Intravenous Pre-Cath  . sodium chloride  3 mL Intravenous Q12H   acetaminophen, hydrALAZINE, oxyCODONE, polyethylene glycol, sodium chloride  Assessment/ Plan:  Acute renal failure serologies ordered no RBC seen on dipstick but urine positive for blood and protein. . Stopped ACE and Vancomycin. Continue Zosyn. . Renal function better  HTN/VOL-stable ARF slowly resolving will follow, will sign  off. Patient has an appointment with [facility] May 9th   LOS: 8 Aaron Long W @TODAY @8 :54 AM

## 2011-05-06 NOTE — Discharge Summary (Signed)
Aaron Long, 71 y.o., is a 71 y.o. male  DOB 08/22/1940  MRN [id number].  Admission date: 04/28/2011  Discharge Date 05/06/2011  Primary MD No primary provider on file.  Admitting Physician Standley Brooking, MD  Admission Diagnosis  Hyperglycemia [790.29] Renal insufficiency [593.9] Diabetic foot ulcer [250.80, 707.15] Cellulitis of foot, left [682.7] LEFT FOOT SWOLLEN   Discharge Diagnosis   Principal Problem:  *Cellulitis Active Problems:  Diabetic foot ulcer associated with type 2 diabetes mellitus  HTN (hypertension)  Diabetes mellitus type 2 with complications  PAD (peripheral artery disease)  Gangrene due to secondary diabetes mellitus  Acute kidney injury  CKD (chronic kidney disease) stage 3, GFR 30-59 ml/min   Past Medical History  Diagnosis Date  . Diabetes mellitus   . Prostate cancer   . Coronary artery disease     Past Surgical History  Procedure Date  . Cardiac surgery   . Prostate surgery   . Knee cartilage surgery     left knee     Hospital Course See H&P, Labs, Consult and Test reports for all details in brief, patient was admitted for   1. Dry gangrene of left fifth toe along with cellulitis of the left foot: Was seen by general surgery , no pus to drain, patient was placed on IV antibiotics with good results, he was also seen by vascular surgery as there was suspicion of compromised blood flow to the left foot, there was a consideration of Angiogram and possible stenting for revascularization , procedure is pending as outpatient pending stabilization of creatinine. Management deferred to vascular surgery as outpatient, discussed with Dr. Darrick Penna  today personally. will place on oral antibiotics for another 10 days, discussed the ID physician Dr. Algis Liming in person, patient's arteriogram should be scheduled only off that he has been cleared by nephrologist Dr. Caryn Section who he will be seen on May 6.    2. Acute renal failure/chronic kidney disease  stage III-IV: Defer management to nephrology. Chronic component thought to be secondary to diabetes and possibly hypertension. ACE inhibitor discontinued. Note baseline Creat is around 2. Creatinine is gradually improving Will follow with nephrology Dr. Caryn Section May 6.   3. Anemia: Stable. Likely secondary to acute disease. Outpatient monitoring by primary care physician nephrology to continue.   4. Diabetes mellitus type 2, uncontrolled: continue Levemir and sliding scale insulin. Hemoglobin A1c 9.1. Outpatient close monitoring by primary care physician.   CBG (last 3)   Basename 05/06/11 0801 05/05/11 2225 05/05/11 1721  GLUCAP 160* 185* 105*       5. Hypertension: stable, continue present medications. Lisinopril discontinued due to # 3.     Consultants:  Nephrology  Vascular surgery  Orthopedic surgery   Procedures:  ABI: Not ascertained secondary to calcification and vessel and compressibility.   Antibiotics:  Zosyn -4-17  Zyvox - 4-24  Vanco- 4-17 to 4-22      Consults  Gen. surgery, nephrology, vascular surgery  Significant Tests:  See full reports for all details    Dg Chest 2 View  05/05/2011  *RADIOLOGY REPORT*  Clinical Data: Cough and dizziness  CHEST - 2 VIEW  Comparison: 11/25/2010  Findings: Prior median sternotomy and CABG procedure.  No airspace consolidation identified.  The interstitial coarsening is noted bilaterally.  Slightly decreased lung volumes compared with previous exam.  Visualized osseous structures are unremarkable.  IMPRESSION:  1.  No acute cardiopulmonary abnormalities.  Original Report Authenticated By: Rosealee Albee, M.D.   US Renal  05/03/2011  *RADIOLOGY REPORT*  Clinical Data: Chronic kidney disease.  No acute renal injury. Acute renal insufficiency.  RENAL/URINARY TRACT ULTRASOUND COMPLETE  Comparison:  04/11/2005.  Findings:  Right Kidney:  12.1 cm.  Normal echotexture.  Normal central sinus echo complex.  No calculi or  hydronephrosis.  Left Kidney:  11.6 cm. Normal echotexture.  Normal central sinus echo complex.  No calculi or hydronephrosis.  Bladder:  Normal.  Partially distended.  IMPRESSION: Normal renal ultrasound.  Compared to the prior exam, renal size is similar.  No interval change.  Original Report Authenticated By: Andreas Newport, M.D.   Dg Foot Complete Left  04/28/2011  *RADIOLOGY REPORT*  Clinical Data: Ulcer on the lateral aspect of the left fifth toe with swelling  LEFT FOOT - COMPLETE 3+ VIEW  Comparison: None.  Findings: The bones are diffusely osteopenic.  No focal demineralization or erosion is seen to indicate osteomyelitis. Tarsal - metatarsal alignment is normal.  Diffuse arterial calcifications are present.  IMPRESSION: No evidence of osteomyelitis.  Diffuse osteopenia with vascular calcifications present.  Original Report Authenticated By: Juline Patch, M.D.     Today   Subjective:   Tarrence Enck today has no headache,no chest abdominal pain,no new weakness tingling or numbness, feels much better wants to go home today.   Objective:   Blood pressure 162/82, pulse 74, temperature 98.4 F (36.9 C), temperature source Oral, resp. rate 20, height 5\' 8"  (1.727 m), weight 92.534 kg (204 lb), SpO2 98.00%.  Intake/Output Summary (Last 24 hours) at 05/06/11 1147 Last data filed at 05/06/11 1009  Gross per 24 hour  Intake   2998 ml  Output   1400 ml  Net   1598 ml    Exam Awake Alert, Oriented *3, No new F.N deficits, Normal affect [place].AT,PERRAL Supple Neck,No JVD, No cervical lymphadenopathy appriciated.  Symmetrical Chest wall movement, Good air movement bilaterally, CTAB RRR,No Gallops,Rubs or new Murmurs, No Parasternal Heave +ve B.Sounds, Abd Soft, Non tender, No organomegaly appriciated, No rebound -guarding or rigidity. No Cyanosis, Clubbing or edema, No new Rash or bruise, left foot cellulitis and left fifth toe onset of look much better and healing well, no appetite  puslike discharge.  Data Review      CBC w Diff: Lab Results  Component Value Date   WBC 7.5 05/06/2011   HGB 10.7* 05/06/2011   HCT 33.5* 05/06/2011   PLT 235 05/06/2011   LYMPHOPCT 30 04/30/2011   MONOPCT 8 04/30/2011   EOSPCT 3 04/30/2011   BASOPCT 1 04/30/2011   CMP: Lab Results  Component Value Date   NA 138 05/06/2011   K 5.0 05/06/2011   CL 105 05/06/2011   CO2 24 05/06/2011   BUN 26* 05/06/2011   CREATININE 2.74* 05/06/2011   PROT 7.3 04/28/2011   ALBUMIN 3.4* 04/28/2011   BILITOT 0.2* 04/28/2011   ALKPHOS 122* 04/28/2011   AST 20 04/28/2011   ALT 13 04/28/2011  .   Discharge Instructions     Follow with Primary MD  in 4 days   Get CBC, CMP, checked 4 days by Primary MD and again as instructed by your Primary MD.   Get Medicines reviewed and adjusted.  Please request your Prim.MD to go over all Hospital Tests and Procedure/Radiological results at the follow up, please get all Hospital records sent to your Prim MD by signing hospital release before you go home.  Activity: Fall precautions use walker/cane & assistance as needed  Diet: Renal Fluid restriction 1.8  lit/day, Aspiration precautions.  For Heart failure patients - Check your Weight same time everyday, if you gain over 2 pounds, or you develop in leg swelling, experience more shortness of breath or chest pain, call your Primary MD immediately. Follow Cardiac Low Salt Diet and 1.8 lit/day fluid restriction.  Disposition Home  If you experience worsening of your admission symptoms, develop shortness of breath, life threatening emergency, suicidal or homicidal thoughts you must seek medical attention immediately by calling 911 or calling your MD immediately  if symptoms less severe.  You Must read complete instructions/literature along with all the possible adverse reactions/side effects for all the Medicines you take and that have been prescribed to you. Take any new Medicines after you have completely understood and  accpet all the possible adverse reactions/side effects.   Do not drive if your were admitted for syncope or siezures until you have seen by Primary MD or a Neurologist and advised to drive.  Do not drive when taking Pain medications.    Do not take more than prescribed Pain, Sleep and Anxiety Medications  Special Instructions: If you have smoked or chewed Tobacco  in the last 2 yrs please stop smoking, stop any regular Alcohol  and or any Recreational drug use.  Wear Seat belts while driving.  Follow-up Information    Follow up with Dr Maurice March your Primary MD. Schedule an appointment as soon as possible for a visit in 4 days.      Follow up with Zada Girt, MD on 05/17/2011.   Contact information:   [address] [employer] [place] [address] [phone]       Follow up with Sherren Kerns, MD. Schedule an appointment as soon as possible for a visit in 3 weeks.   Contact information:   [address] [place] [address] [phone]          Discharge Medications   Medication List  As of 05/06/2011 11:47 AM   START taking these medications         amLODipine 10 MG tablet   Commonly known as: NORVASC   Take 1 tablet (10 mg total) by mouth daily.      amoxicillin-clavulanate 500-125 MG per tablet   Commonly known as: AUGMENTIN   Take 1 tablet (500 mg total) by mouth 3 (three) times daily.      doxycycline 50 MG capsule   Commonly known as: VIBRAMYCIN   Take 1 capsule (50 mg total) by mouth 2 (two) times daily.      oxyCODONE 5 MG immediate release tablet   Commonly known as: Oxy IR/ROXICODONE   Take 1 tablet (5 mg total) by mouth every 6 (six) hours as needed for pain.      saccharomyces boulardii 250 MG capsule   Commonly known as: FLORASTOR   Take 1 capsule (250 mg total) by mouth 2 (two) times daily. Can give any probiotic         CHANGE how you take these medications         metoprolol tartrate 25 MG tablet    Commonly known as: LOPRESSOR   Take 2 tablets (50 mg total) by mouth 2 (two) times daily.   What changed: dose         CONTINUE taking these medications         aspirin EC 81 MG tablet      insulin aspart 100 UNIT/ML injection   Commonly known as: novoLOG  insulin detemir 100 UNIT/ML injection   Commonly known as: LEVEMIR      pravastatin 80 MG tablet   Commonly known as: PRAVACHOL      pregabalin 75 MG capsule   Commonly known as: LYRICA         STOP taking these medications         azithromycin 250 MG tablet      lisinopril 10 MG tablet          Where to get your medications    These are the prescriptions that you need to pick up.   You may get these medications from any pharmacy.         amLODipine 10 MG tablet   amoxicillin-clavulanate 500-125 MG per tablet   doxycycline 50 MG capsule   metoprolol tartrate 25 MG tablet   oxyCODONE 5 MG immediate release tablet   saccharomyces boulardii 250 MG capsule             Total Time in preparing paper work, data evaluation and todays exam - 35 minutes  Leroy Sea M.D on 05/06/2011 at 11:47 AM  [facility] Office  [phone]

## 2011-05-06 NOTE — Discharge Instructions (Signed)
Follow with Primary MD  in 4 days   Get CBC, CMP, checked 4 days by Primary MD and again as instructed by your Primary MD.   Get Medicines reviewed and adjusted.  Please request your Prim.MD to go over all Hospital Tests and Procedure/Radiological results at the follow up, please get all Hospital records sent to your Prim MD by signing hospital release before you go home.  Activity: Fall precautions use walker/cane & assistance as needed  Diet: Renal Fluid restriction 1.8 lit/day, Aspiration precautions.  For Heart failure patients - Check your Weight same time everyday, if you gain over 2 pounds, or you develop in leg swelling, experience more shortness of breath or chest pain, call your Primary MD immediately. Follow Cardiac Low Salt Diet and 1.8 lit/day fluid restriction.  Disposition Home  If you experience worsening of your admission symptoms, develop shortness of breath, life threatening emergency, suicidal or homicidal thoughts you must seek medical attention immediately by calling 911 or calling your MD immediately  if symptoms less severe.  You Must read complete instructions/literature along with all the possible adverse reactions/side effects for all the Medicines you take and that have been prescribed to you. Take any new Medicines after you have completely understood and accpet all the possible adverse reactions/side effects.   Do not drive if your were admitted for syncope or siezures until you have seen by Primary MD or a Neurologist and advised to drive.  Do not drive when taking Pain medications.    Do not take more than prescribed Pain, Sleep and Anxiety Medications  Special Instructions: If you have smoked or chewed Tobacco  in the last 2 yrs please stop smoking, stop any regular Alcohol  and or any Recreational drug use.  Wear Seat belts while driving.

## 2011-05-06 NOTE — Progress Notes (Signed)
Spoke with primary team Pt still with elevated creatinine which they believe still has some room for improvement Will defer agram for now and schedule for outpt visit with me in 2 weeks  Fabienne Bruns, MD [facility] Office: [phone] Pager: [phone]

## 2011-05-06 NOTE — Progress Notes (Signed)
Discharge instructions reviewed with patient/significant other. Medications/prescriptions reviewed as well as D/C medications. Follow up appointments reviewed.  Medication teaching done and questions answered.  Voices understanding to teaching. Prescriptions and copy of D/C instructions given to patient. To Door via wheelchair.  Home Via POV with his wife driving.

## 2011-05-06 NOTE — Progress Notes (Addendum)
Left fifth toe dry but some mild foot pain  Blood pressure 162/82, pulse 74, temperature 98.4 F (36.9 C), temperature source Oral, resp. rate 20, height 5\' 8"  (1.727 m), weight 204 lb (92.534 kg), SpO2 98.00%.  Left 5th toe dry gangrene, dorsal foot edema, mild tenderness with palpation,erythema essentially resolved  BMET    Component Value Date/Time   NA 138 05/06/2011 0704   K 5.0 05/06/2011 0704   CL 105 05/06/2011 0704   CO2 24 05/06/2011 0704   GLUCOSE 167* 05/06/2011 0704   BUN 26* 05/06/2011 0704   CREATININE 2.74* 05/06/2011 0704   CALCIUM 9.3 05/06/2011 0704   GFRNONAA 22* 05/06/2011 0704   GFRAA 25* 05/06/2011 0704   Assessment severe PAD  Arteriogram tomorrow if renal believes function stable at this point  Will use CO2 and contrast and try to mimimize dye NPO post midnight Consent Gentle hydration today Bicarb drip tomorrow a.m.  Fabienne Bruns, MD [facility] Office: [phone] Pager: [phone]

## 2011-05-06 NOTE — Progress Notes (Signed)
   CARE MANAGEMENT NOTE 05/06/2011  Patient:  Aaron Long, Aaron Long   Account Number:  [id number]  Date Initiated:  05/03/2011  Documentation initiated by:  Letha Cape  Subjective/Objective Assessment:   dx cellulitis, dry gangreene of little toe  admit-lives with spouse.     Action/Plan:   HHRN to monitor gangrene   Anticipated DC Date:  05/06/2011   Anticipated DC Plan:  HOME W HOME HEALTH SERVICES      DC Planning Services  CM consult      [facility] Choice  HOME HEALTH   Choice offered to / List presented to:  C-1 Patient        HH arranged  HH-1 RN      [facility] agency  [facility]   Status of service:  Completed, signed off Medicare Important Message given?   (If response is "NO", the following Medicare IM given date fields will be blank) Date Medicare IM given:   Date Additional Medicare IM given:    Discharge Disposition:  HOME W HOME HEALTH SERVICES  Per UR Regulation:    If discussed at Long Length of Stay Meetings, dates discussed:   05/05/2011    Comments:  05/06/11 12:12 Letha Cape RN, BSN [phone] patient for discharge today, will need [facility],  patient chose [facility] ,  referral made to [facility], Marie notified.  Soc will begin 24-48 hrs post discharge.  Patient has medication coverage and transportation.  05/05/11- 1600- Donn Pierini RN, BSN [phone] Pt awaiting artiogram - plan maybe for friday 05/07/11- CM to continue to follow.  05/03/11 16:06 Letha Cape RN, BSN [phone] patient lives with spouse, pta independent.  NCM will continue to follow for dc needs.

## 2011-05-06 NOTE — Telephone Encounter (Signed)
Patient could not be reached today by phone. Mailed letter to home address to notify of upcoming appointment.   

## 2011-05-06 NOTE — Telephone Encounter (Signed)
Message copied by Fredrich Birks on Thu May 06, 2011 11:15 AM ------      Message from: Fabienne Bruns E      Created: Thu May 06, 2011 10:40 AM       Daily visit charge today to check foot and creatinine.  I was going to do agram but service called me back and wants to wait.  Please schedule follow up office visit in 2 weeks            Leonette Most

## 2011-05-08 DIAGNOSIS — E1165 Type 2 diabetes mellitus with hyperglycemia: Secondary | ICD-10-CM | POA: Diagnosis not present

## 2011-05-08 DIAGNOSIS — L02818 Cutaneous abscess of other sites: Secondary | ICD-10-CM | POA: Diagnosis not present

## 2011-05-08 DIAGNOSIS — I96 Gangrene, not elsewhere classified: Secondary | ICD-10-CM | POA: Diagnosis not present

## 2011-05-08 DIAGNOSIS — E1159 Type 2 diabetes mellitus with other circulatory complications: Secondary | ICD-10-CM | POA: Diagnosis not present

## 2011-05-08 DIAGNOSIS — L97509 Non-pressure chronic ulcer of other part of unspecified foot with unspecified severity: Secondary | ICD-10-CM | POA: Diagnosis not present

## 2011-05-08 DIAGNOSIS — I739 Peripheral vascular disease, unspecified: Secondary | ICD-10-CM | POA: Diagnosis not present

## 2011-05-11 DIAGNOSIS — E1159 Type 2 diabetes mellitus with other circulatory complications: Secondary | ICD-10-CM | POA: Diagnosis not present

## 2011-05-11 DIAGNOSIS — L97509 Non-pressure chronic ulcer of other part of unspecified foot with unspecified severity: Secondary | ICD-10-CM | POA: Diagnosis not present

## 2011-05-11 DIAGNOSIS — I96 Gangrene, not elsewhere classified: Secondary | ICD-10-CM | POA: Diagnosis not present

## 2011-05-11 DIAGNOSIS — I739 Peripheral vascular disease, unspecified: Secondary | ICD-10-CM | POA: Diagnosis not present

## 2011-05-11 DIAGNOSIS — L03818 Cellulitis of other sites: Secondary | ICD-10-CM | POA: Diagnosis not present

## 2011-05-11 DIAGNOSIS — E1165 Type 2 diabetes mellitus with hyperglycemia: Secondary | ICD-10-CM | POA: Diagnosis not present

## 2011-05-11 DIAGNOSIS — L02818 Cutaneous abscess of other sites: Secondary | ICD-10-CM | POA: Diagnosis not present

## 2011-05-14 DIAGNOSIS — L02818 Cutaneous abscess of other sites: Secondary | ICD-10-CM | POA: Diagnosis not present

## 2011-05-14 DIAGNOSIS — L97509 Non-pressure chronic ulcer of other part of unspecified foot with unspecified severity: Secondary | ICD-10-CM | POA: Diagnosis not present

## 2011-05-14 DIAGNOSIS — E1159 Type 2 diabetes mellitus with other circulatory complications: Secondary | ICD-10-CM | POA: Diagnosis not present

## 2011-05-14 DIAGNOSIS — E1165 Type 2 diabetes mellitus with hyperglycemia: Secondary | ICD-10-CM | POA: Diagnosis not present

## 2011-05-14 DIAGNOSIS — L03818 Cellulitis of other sites: Secondary | ICD-10-CM | POA: Diagnosis not present

## 2011-05-14 DIAGNOSIS — I739 Peripheral vascular disease, unspecified: Secondary | ICD-10-CM | POA: Diagnosis not present

## 2011-05-14 DIAGNOSIS — I96 Gangrene, not elsewhere classified: Secondary | ICD-10-CM | POA: Diagnosis not present

## 2011-05-17 DIAGNOSIS — I10 Essential (primary) hypertension: Secondary | ICD-10-CM | POA: Diagnosis not present

## 2011-05-17 DIAGNOSIS — I2510 Atherosclerotic heart disease of native coronary artery without angina pectoris: Secondary | ICD-10-CM | POA: Diagnosis not present

## 2011-05-17 DIAGNOSIS — C61 Malignant neoplasm of prostate: Secondary | ICD-10-CM | POA: Diagnosis not present

## 2011-05-17 DIAGNOSIS — E119 Type 2 diabetes mellitus without complications: Secondary | ICD-10-CM | POA: Diagnosis not present

## 2011-05-18 DIAGNOSIS — I96 Gangrene, not elsewhere classified: Secondary | ICD-10-CM | POA: Diagnosis not present

## 2011-05-18 DIAGNOSIS — L02818 Cutaneous abscess of other sites: Secondary | ICD-10-CM | POA: Diagnosis not present

## 2011-05-18 DIAGNOSIS — L97509 Non-pressure chronic ulcer of other part of unspecified foot with unspecified severity: Secondary | ICD-10-CM | POA: Diagnosis not present

## 2011-05-18 DIAGNOSIS — L03818 Cellulitis of other sites: Secondary | ICD-10-CM | POA: Diagnosis not present

## 2011-05-18 DIAGNOSIS — E1165 Type 2 diabetes mellitus with hyperglycemia: Secondary | ICD-10-CM | POA: Diagnosis not present

## 2011-05-18 DIAGNOSIS — I739 Peripheral vascular disease, unspecified: Secondary | ICD-10-CM | POA: Diagnosis not present

## 2011-05-18 DIAGNOSIS — E1159 Type 2 diabetes mellitus with other circulatory complications: Secondary | ICD-10-CM | POA: Diagnosis not present

## 2011-05-19 ENCOUNTER — Encounter: Payer: Self-pay | Admitting: Vascular Surgery

## 2011-05-20 ENCOUNTER — Ambulatory Visit (INDEPENDENT_AMBULATORY_CARE_PROVIDER_SITE_OTHER): Payer: Medicare Other | Admitting: Vascular Surgery

## 2011-05-20 ENCOUNTER — Encounter: Payer: Self-pay | Admitting: Vascular Surgery

## 2011-05-20 ENCOUNTER — Other Ambulatory Visit: Payer: Self-pay

## 2011-05-20 VITALS — BP 180/72 | HR 71 | Resp 16 | Ht 68.0 in | Wt 203.1 lb

## 2011-05-20 DIAGNOSIS — E1169 Type 2 diabetes mellitus with other specified complication: Secondary | ICD-10-CM | POA: Diagnosis not present

## 2011-05-20 DIAGNOSIS — E11621 Type 2 diabetes mellitus with foot ulcer: Secondary | ICD-10-CM

## 2011-05-20 DIAGNOSIS — L97509 Non-pressure chronic ulcer of other part of unspecified foot with unspecified severity: Secondary | ICD-10-CM | POA: Diagnosis not present

## 2011-05-20 DIAGNOSIS — E119 Type 2 diabetes mellitus without complications: Secondary | ICD-10-CM | POA: Diagnosis not present

## 2011-05-20 DIAGNOSIS — I10 Essential (primary) hypertension: Secondary | ICD-10-CM | POA: Diagnosis not present

## 2011-05-20 NOTE — Progress Notes (Signed)
[facility]   HISTORY AND PHYSICAL  History of Present Illness: Patient is a 71 y.o. year old male who presents for follow-up evaluation for PAD. He is on Aspirin for antiplatelet therapy. He was recently seen as an inpatient consult for dry gangrene of his left fifth toe. At that time he also had renal insufficiency and was evaluated by the nephrology service. It was decided to defer his arteriogram until his kidney function can improve somewhat. He returns today for further followup. He has no pain in the left foot. He has had no drainage from the left toe. There was cellulitis in his left foot of admission this is now resolved. His atherosclerotic risk factors remain diabetes, elevated cholesterol, smoking( remote 1993), and coronary artery disease. These are all currently stable and followed by the primary care physician. He is also being followed by Dr. Fox with nephrology.  Past Medical History   Diagnosis  Date   .  Diabetes mellitus    .  Coronary artery disease    .  Prostate cancer  2010    Past Surgical History   Procedure  Date   .  Cardiac surgery    .  Prostate surgery    .  Knee cartilage surgery      left knee    Review of Systems:  Neurologic: sensation in the feet is intact  Cardiac:denies shortness of breath or chest pain  Pulmonary: denies cough or wheeze  Social History  History   Substance Use Topics   .  Smoking status:  Former Smoker -- 40 years     Quit date:  04/28/1991   .  Smokeless tobacco:  Never Used   .  Alcohol Use:  Yes      occasional    Allergies  No Known Allergies  Current Outpatient Prescriptions   Medication  Sig  Dispense  Refill   .  amLODipine (NORVASC) 10 MG tablet  Take 1 tablet (10 mg total) by mouth daily.  30 tablet  0   .  aspirin EC 81 MG tablet  Take 81 mg by mouth daily.     .  insulin aspart (NOVOLOG) 100 UNIT/ML injection  Inject 10-12 Units into the skin See admin instructions. USES 10 UNITS IN THE  MORNING 10 UNITS AT LUNCH AND 12 UNITS AT SUPPER     .  insulin detemir (LEVEMIR) 100 UNIT/ML injection  Inject 26 Units into the skin 2 (two) times daily.     .  metoprolol tartrate (LOPRESSOR) 25 MG tablet  Take 2 tablets (50 mg total) by mouth 2 (two) times daily.  60 tablet  0   .  pravastatin (PRAVACHOL) 80 MG tablet  Take 80 mg by mouth daily.     .  pregabalin (LYRICA) 75 MG capsule  Take 75 mg by mouth 2 (two) times daily.      Physical Examination  Filed Vitals:    05/20/11 1020  05/20/11 1041   BP:  178/72  180/72   Pulse:  71    Resp:  16    Height:  5' 8" (1.727 m)    Weight:  203 lb 1.6 oz (92.126 kg)    SpO2:  100%     Body mass index is 30.88 kg/(m^2).  General: Alert and oriented, no acute distress  HEENT: Normal  Neck: No bruit or JVD  Pulmonary: Clear to auscultation bilaterally  Cardiac: Regular Rate and Rhythm without murmur    Neurologic: Upper and lower extremity motor 5/5 and symmetric  Extremities: 2+ femoral absent popliteal and pedal pulses  Skin: no ulcer or rash, dry gangrene left 5th toe  ASSESSMENT: Arterial occlusive disease with gangrene of left fifth toe, multiple additional comorbidities including renal dysfunction  PLAN: Aortogram with lower extremity runoff possible intervention on 05/28/2011. We will admit the patient on 05/27/2011 for IV hydration. Risks benefits possible complications and procedure details were explained the patient and his wife today including but limited to worsening renal function, limb loss, bleeding, vessel injury. They understand and agree to proceed.  Stormee Duda, MD  [facility] [place]  Office: [phone]  Pager: [phone]  

## 2011-05-21 DIAGNOSIS — L03818 Cellulitis of other sites: Secondary | ICD-10-CM | POA: Diagnosis not present

## 2011-05-21 DIAGNOSIS — L97509 Non-pressure chronic ulcer of other part of unspecified foot with unspecified severity: Secondary | ICD-10-CM | POA: Diagnosis not present

## 2011-05-21 DIAGNOSIS — E1159 Type 2 diabetes mellitus with other circulatory complications: Secondary | ICD-10-CM | POA: Diagnosis not present

## 2011-05-21 DIAGNOSIS — E1165 Type 2 diabetes mellitus with hyperglycemia: Secondary | ICD-10-CM | POA: Diagnosis not present

## 2011-05-21 DIAGNOSIS — I96 Gangrene, not elsewhere classified: Secondary | ICD-10-CM | POA: Diagnosis not present

## 2011-05-21 DIAGNOSIS — I739 Peripheral vascular disease, unspecified: Secondary | ICD-10-CM | POA: Diagnosis not present

## 2011-05-24 ENCOUNTER — Encounter (HOSPITAL_COMMUNITY): Payer: Self-pay | Admitting: Respiratory Therapy

## 2011-05-27 ENCOUNTER — Inpatient Hospital Stay (HOSPITAL_COMMUNITY)
Admission: AD | Admit: 2011-05-27 | Discharge: 2011-05-29 | DRG: 301 | Disposition: A | Discharge status: Home / Self Care | Payer: Medicare Other | Source: Ambulatory Visit | Attending: Vascular Surgery | Admitting: Vascular Surgery

## 2011-05-27 ENCOUNTER — Encounter (HOSPITAL_COMMUNITY): Payer: Self-pay | Admitting: General Practice

## 2011-05-27 ENCOUNTER — Other Ambulatory Visit: Payer: Self-pay

## 2011-05-27 DIAGNOSIS — Z8546 Personal history of malignant neoplasm of prostate: Secondary | ICD-10-CM

## 2011-05-27 DIAGNOSIS — E11621 Type 2 diabetes mellitus with foot ulcer: Secondary | ICD-10-CM

## 2011-05-27 DIAGNOSIS — Z7982 Long term (current) use of aspirin: Secondary | ICD-10-CM | POA: Diagnosis not present

## 2011-05-27 DIAGNOSIS — Z79899 Other long term (current) drug therapy: Secondary | ICD-10-CM

## 2011-05-27 DIAGNOSIS — Z794 Long term (current) use of insulin: Secondary | ICD-10-CM

## 2011-05-27 DIAGNOSIS — E1149 Type 2 diabetes mellitus with other diabetic neurological complication: Secondary | ICD-10-CM | POA: Diagnosis present

## 2011-05-27 DIAGNOSIS — E1142 Type 2 diabetes mellitus with diabetic polyneuropathy: Secondary | ICD-10-CM | POA: Diagnosis present

## 2011-05-27 DIAGNOSIS — Z87891 Personal history of nicotine dependence: Secondary | ICD-10-CM

## 2011-05-27 DIAGNOSIS — I739 Peripheral vascular disease, unspecified: Secondary | ICD-10-CM | POA: Diagnosis not present

## 2011-05-27 DIAGNOSIS — L97509 Non-pressure chronic ulcer of other part of unspecified foot with unspecified severity: Secondary | ICD-10-CM | POA: Diagnosis not present

## 2011-05-27 DIAGNOSIS — I70269 Atherosclerosis of native arteries of extremities with gangrene, unspecified extremity: Secondary | ICD-10-CM | POA: Diagnosis not present

## 2011-05-27 DIAGNOSIS — E7800 Pure hypercholesterolemia, unspecified: Secondary | ICD-10-CM | POA: Diagnosis present

## 2011-05-27 DIAGNOSIS — Z01810 Encounter for preprocedural cardiovascular examination: Secondary | ICD-10-CM | POA: Diagnosis not present

## 2011-05-27 DIAGNOSIS — I2510 Atherosclerotic heart disease of native coronary artery without angina pectoris: Secondary | ICD-10-CM | POA: Diagnosis present

## 2011-05-27 DIAGNOSIS — N183 Chronic kidney disease, stage 3 unspecified: Secondary | ICD-10-CM | POA: Diagnosis present

## 2011-05-27 DIAGNOSIS — E1169 Type 2 diabetes mellitus with other specified complication: Secondary | ICD-10-CM | POA: Diagnosis not present

## 2011-05-27 HISTORY — DX: Gangrene, not elsewhere classified: I96

## 2011-05-27 HISTORY — DX: Peripheral vascular disease, unspecified: I73.9

## 2011-05-27 HISTORY — DX: Essential (primary) hypertension: I10

## 2011-05-27 HISTORY — DX: Myoneural disorder, unspecified: G70.9

## 2011-05-27 HISTORY — DX: Acute myocardial infarction, unspecified: I21.9

## 2011-05-27 LAB — HEMOGLOBIN A1C
Hgb A1c MFr Bld: 7.9 % — ABNORMAL HIGH (ref <5.7)
Mean Plasma Glucose: 180 mg/dL — ABNORMAL HIGH (ref <117)

## 2011-05-27 LAB — URINALYSIS, ROUTINE W REFLEX MICROSCOPIC
Bilirubin Urine: NEGATIVE
Glucose, UA: NEGATIVE mg/dL
Ketones, ur: NEGATIVE mg/dL
Leukocytes, UA: NEGATIVE
Nitrite: NEGATIVE
Protein, ur: 100 mg/dL — AB
Specific Gravity, Urine: 1.012 (ref 1.005–1.030)
Urobilinogen, UA: 0.2 mg/dL (ref 0.0–1.0)
pH: 6.5 (ref 5.0–8.0)

## 2011-05-27 LAB — COMPREHENSIVE METABOLIC PANEL
ALT: 12 U/L (ref 0–53)
AST: 19 U/L (ref 0–37)
Albumin: 3.1 g/dL — ABNORMAL LOW (ref 3.5–5.2)
CO2: 28 mEq/L (ref 19–32)
Calcium: 9.2 mg/dL (ref 8.4–10.5)
Chloride: 104 mEq/L (ref 96–112)
Creatinine, Ser: 2.5 mg/dL — ABNORMAL HIGH (ref 0.50–1.35)
GFR calc non Af Amer: 24 mL/min — ABNORMAL LOW (ref >90)
Sodium: 138 mEq/L (ref 135–145)
Total Bilirubin: 0.2 mg/dL — ABNORMAL LOW (ref 0.3–1.2)

## 2011-05-27 LAB — CBC
MCV: 92.2 fL (ref 78.0–100.0)
Platelets: 201 10*3/uL (ref 150–400)
RBC: 3.73 MIL/uL — ABNORMAL LOW (ref 4.22–5.81)
RDW: 13.4 % (ref 11.5–15.5)
WBC: 7.4 10*3/uL (ref 4.0–10.5)

## 2011-05-27 LAB — PROTIME-INR
INR: 1.03 (ref 0.00–1.49)
Prothrombin Time: 13.7 seconds (ref 11.6–15.2)

## 2011-05-27 LAB — URINE MICROSCOPIC-ADD ON

## 2011-05-27 MED ORDER — ONDANSETRON HCL 4 MG/2ML IJ SOLN: 4.0000 mg | Freq: Four times a day (QID) | INTRAMUSCULAR | Status: DC | PRN

## 2011-05-27 MED ORDER — MORPHINE SULFATE 2 MG/ML IJ SOLN: 2.0000 mg | INTRAMUSCULAR | Status: DC | PRN

## 2011-05-27 MED ORDER — HYDRALAZINE HCL 20 MG/ML IJ SOLN
10.0000 mg | INTRAMUSCULAR | Status: DC | PRN
  Administered 2011-05-28: 10 mg via INTRAVENOUS
  Filled 2011-05-27: qty 0.5

## 2011-05-27 MED ORDER — ASPIRIN EC 81 MG PO TBEC
81.0000 mg | DELAYED_RELEASE_TABLET | Freq: Every day | ORAL | Status: DC
Start: 2011-05-29 — End: 2011-05-29
  Administered 2011-05-29: 81 mg via ORAL
  Filled 2011-05-27: qty 1

## 2011-05-27 MED ORDER — SODIUM BICARBONATE 8.4 % IV SOLN
Freq: Once | INTRAVENOUS | Status: DC
  Filled 2011-05-27: qty 500

## 2011-05-27 MED ORDER — GUAIFENESIN-DM 100-10 MG/5ML PO SYRP: 15.0000 mL | ORAL_SOLUTION | ORAL | Status: DC | PRN

## 2011-05-27 MED ORDER — INSULIN DETEMIR 100 UNIT/ML ~~LOC~~ SOLN
26.0000 [IU] | Freq: Two times a day (BID) | SUBCUTANEOUS | Status: DC
  Administered 2011-05-28 – 2011-05-29 (×2): 26 [IU] via SUBCUTANEOUS
  Filled 2011-05-27: qty 10

## 2011-05-27 MED ORDER — PREGABALIN 50 MG PO CAPS
75.0000 mg | ORAL_CAPSULE | Freq: Two times a day (BID) | ORAL | Status: DC
  Administered 2011-05-27 – 2011-05-29 (×4): 75 mg via ORAL
  Filled 2011-05-27 (×4): qty 1

## 2011-05-27 MED ORDER — OXYCODONE-ACETAMINOPHEN 5-325 MG PO TABS
1.0000 | ORAL_TABLET | ORAL | Status: DC | PRN
  Administered 2011-05-27: 1 via ORAL
  Filled 2011-05-27: qty 1

## 2011-05-27 MED ORDER — PHENOL 1.4 % MT LIQD
1.0000 | OROMUCOSAL | Status: DC | PRN
  Filled 2011-05-27: qty 177

## 2011-05-27 MED ORDER — SODIUM BICARBONATE 8.4 % IV SOLN
Freq: Once | INTRAVENOUS | Status: AC
  Administered 2011-05-28: 06:00:00 via INTRAVENOUS
  Filled 2011-05-27: qty 1000

## 2011-05-27 MED ORDER — SIMVASTATIN 40 MG PO TABS
40.0000 mg | ORAL_TABLET | Freq: Every day | ORAL | Status: DC
  Filled 2011-05-27: qty 1

## 2011-05-27 MED ORDER — METOPROLOL TARTRATE 50 MG PO TABS
50.0000 mg | ORAL_TABLET | Freq: Two times a day (BID) | ORAL | Status: DC
  Administered 2011-05-27 – 2011-05-29 (×4): 50 mg via ORAL
  Filled 2011-05-27 (×5): qty 1

## 2011-05-27 MED ORDER — POLYETHYLENE GLYCOL 3350 17 G PO PACK
17.0000 g | PACK | Freq: Every day | ORAL | Status: DC
  Administered 2011-05-28 – 2011-05-29 (×2): 17 g via ORAL
  Filled 2011-05-27 (×2): qty 1

## 2011-05-27 MED ORDER — INSULIN ASPART 100 UNIT/ML ~~LOC~~ SOLN
0.0000 [IU] | Freq: Three times a day (TID) | SUBCUTANEOUS | Status: DC
  Administered 2011-05-27: 2 [IU] via SUBCUTANEOUS
  Administered 2011-05-28 (×2): 3 [IU] via SUBCUTANEOUS
  Administered 2011-05-29: 2 [IU] via SUBCUTANEOUS

## 2011-05-27 MED ORDER — ACETAMINOPHEN 325 MG PO TABS: 325.0000 mg | ORAL_TABLET | ORAL | Status: DC | PRN

## 2011-05-27 MED ORDER — AMLODIPINE BESYLATE 10 MG PO TABS
10.0000 mg | ORAL_TABLET | Freq: Every day | ORAL | Status: DC
  Administered 2011-05-28 – 2011-05-29 (×2): 10 mg via ORAL
  Filled 2011-05-27 (×2): qty 1

## 2011-05-27 MED ORDER — SODIUM CHLORIDE 0.9 % IV SOLN
INTRAVENOUS | Status: DC
  Administered 2011-05-27 – 2011-05-28 (×2): via INTRAVENOUS

## 2011-05-27 MED ORDER — LABETALOL HCL 5 MG/ML IV SOLN
10.0000 mg | INTRAVENOUS | Status: DC | PRN
  Filled 2011-05-27: qty 4

## 2011-05-27 MED ORDER — PANTOPRAZOLE SODIUM 40 MG PO TBEC
40.0000 mg | DELAYED_RELEASE_TABLET | Freq: Every day | ORAL | Status: DC
  Administered 2011-05-27 – 2011-05-29 (×3): 40 mg via ORAL
  Filled 2011-05-27 (×3): qty 1

## 2011-05-27 MED ORDER — ATORVASTATIN CALCIUM 20 MG PO TABS
20.0000 mg | ORAL_TABLET | Freq: Every day | ORAL | Status: DC
  Administered 2011-05-27 – 2011-05-28 (×2): 20 mg via ORAL
  Filled 2011-05-27 (×3): qty 1

## 2011-05-27 MED ORDER — SODIUM BICARBONATE 8.4 % IV SOLN
Freq: Once | INTRAVENOUS | Status: AC
  Filled 2011-05-27: qty 500

## 2011-05-27 MED ORDER — ACETAMINOPHEN 650 MG RE SUPP: 325.0000 mg | RECTAL | Status: DC | PRN

## 2011-05-27 MED ORDER — METOPROLOL TARTRATE 1 MG/ML IV SOLN: 2.0000 mg | INTRAVENOUS | Status: DC | PRN

## 2011-05-27 NOTE — H&P (Signed)
[facility] [place]  HISTORY AND PHYSICAL  History of Present Illness: Patient is a 71 y.o. year old male who presents for follow-up evaluation for PAD. He is on Aspirin for antiplatelet therapy. He was recently seen as an inpatient consult for dry gangrene of his left fifth toe. At that time he also had renal insufficiency and was evaluated by the nephrology service. It was decided to defer his arteriogram until his kidney function can improve somewhat. He returns today for further followup. He has no pain in the left foot. He has had no drainage from the left toe. There was cellulitis in his left foot of admission this is now resolved. His atherosclerotic risk factors remain diabetes, elevated cholesterol, smoking( remote 1993), and coronary artery disease. These are all currently stable and followed by the primary care physician. He is also being followed by Dr. Caryn Section with nephrology.  Past Medical History   Diagnosis  Date   .  Diabetes mellitus    .  Coronary artery disease    .  Prostate cancer  2010    Past Surgical History   Procedure  Date   .  Cardiac surgery    .  Prostate surgery    .  Knee cartilage surgery      left knee    Review of Systems:  Neurologic: sensation in the feet is intact  Cardiac:denies shortness of breath or chest pain  Pulmonary: denies cough or wheeze  Social History  History   Substance Use Topics   .  Smoking status:  Former Smoker -- 40 years     Quit date:  04/28/1991   .  Smokeless tobacco:  Never Used   .  Alcohol Use:  Yes      occasional    Allergies  No Known Allergies  Current Outpatient Prescriptions   Medication  Sig  Dispense  Refill   .  amLODipine (NORVASC) 10 MG tablet  Take 1 tablet (10 mg total) by mouth daily.  30 tablet  0   .  aspirin EC 81 MG tablet  Take 81 mg by mouth daily.     .  insulin aspart (NOVOLOG) 100 UNIT/ML injection  Inject 10-12 Units into the skin See admin instructions. USES 10 UNITS IN THE  MORNING 10 UNITS AT LUNCH AND 12 UNITS AT SUPPER     .  insulin detemir (LEVEMIR) 100 UNIT/ML injection  Inject 26 Units into the skin 2 (two) times daily.     .  metoprolol tartrate (LOPRESSOR) 25 MG tablet  Take 2 tablets (50 mg total) by mouth 2 (two) times daily.  60 tablet  0   .  pravastatin (PRAVACHOL) 80 MG tablet  Take 80 mg by mouth daily.     .  pregabalin (LYRICA) 75 MG capsule  Take 75 mg by mouth 2 (two) times daily.      Physical Examination  Filed Vitals:    05/20/11 1020  05/20/11 1041   BP:  178/72  180/72   Pulse:  71    Resp:  16    Height:  5\' 8"  (1.727 m)    Weight:  203 lb 1.6 oz (92.126 kg)    SpO2:  100%     Body mass index is 30.88 kg/(m^2).  General: Alert and oriented, no acute distress  HEENT: Normal  Neck: No bruit or JVD  Pulmonary: Clear to auscultation bilaterally  Cardiac: Regular Rate and Rhythm without murmur  Neurologic: Upper and lower extremity motor 5/5 and symmetric  Extremities: 2+ femoral absent popliteal and pedal pulses  Skin: no ulcer or rash, dry gangrene left 5th toe  ASSESSMENT: Arterial occlusive disease with gangrene of left fifth toe, multiple additional comorbidities including renal dysfunction  PLAN: Aortogram with lower extremity runoff possible intervention on 05/28/2011. We will admit the patient on 05/27/2011 for IV hydration. Risks benefits possible complications and procedure details were explained the patient and his wife today including but limited to worsening renal function, limb loss, bleeding, vessel injury. They understand and agree to proceed.  Fabienne Bruns, MD  [facility] [place]  Office: [phone]  Pager: [phone]

## 2011-05-27 NOTE — Progress Notes (Addendum)
MEDICATION RELATED CONSULT NOTE - INITIAL   Pharmacy Consult for Bicarbonate protocol Indication: angiogram on 05/28/11  No Known Allergies  Patient Measurements: Height: 5' 8" (172.7 cm) Weight: 201 lb 12.8 oz (91.536 kg) IBW/kg (Calculated) : 68.4   Vital Signs: Temp: 98.6 F (37 C) (05/16 1201) Temp src: Oral (05/16 1201) BP: 155/69 mmHg (05/16 1201) Pulse Rate: 66  (05/16 1201)    Labs: Estimated Creatinine Clearance: 27.1 ml/min (by C-G formula based on Cr of 2.74).   Microbiology: No results found for this or any previous visit (from the past 720 hour(s)).  Medical History: Past Medical History  Diagnosis Date  . Diabetes mellitus   . Coronary artery disease   . Prostate cancer 2010    Medications:  Prescriptions prior to admission  Medication Sig Dispense Refill  . amLODipine (NORVASC) 10 MG tablet Take 1 tablet (10 mg total) by mouth daily.  30 tablet  0  . aspirin EC 81 MG tablet Take 81 mg by mouth daily.        . insulin aspart (NOVOLOG) 100 UNIT/ML injection Inject 10-12 Units into the skin See admin instructions. USES 10 UNITS IN THE MORNING 10 UNITS AT LUNCH AND 12 UNITS AT SUPPER      . insulin detemir (LEVEMIR) 100 UNIT/ML injection Inject 26 Units into the skin 2 (two) times daily.       . metoprolol tartrate (LOPRESSOR) 25 MG tablet Take 2 tablets (50 mg total) by mouth 2 (two) times daily.  60 tablet  0  . polyethylene glycol (MIRALAX / GLYCOLAX) packet Take 17 g by mouth daily.      . pravastatin (PRAVACHOL) 80 MG tablet Take 80 mg by mouth daily.        . pregabalin (LYRICA) 75 MG capsule Take 75 mg by mouth 2 (two) times daily.       Scheduled:    . amLODipine  10 mg Oral Daily  . aspirin EC  81 mg Oral Daily  . insulin aspart  0-15 Units Subcutaneous TID WC  . insulin detemir  26 Units Subcutaneous BID  . metoprolol tartrate  50 mg Oral BID  . pantoprazole  40 mg Oral Q1200  . polyethylene glycol  17 g Oral Daily  . pregabalin  75 mg Oral  BID  . simvastatin  40 mg Oral q1800    Assessment: 70 yo male w/ dry gangrene of his left fifth toe here for arteriogram on 5/17 to start a bicarbonate protocol for renal protection. Mucomyst was also requested but use is restricted to pulmonary indications only due to a national drug shortage.   Plan:  -Will begin sodium bicarbonate protocol one hour prior to procedure on 05/28/11 -Unable to provide mucomyst due to a national drug shortage  Will sign off for now. Thank you for asking pharmacy to be involved in the care of this patient.  Zilda No, Pharm D 05/27/2011 12:45 PM      

## 2011-05-28 ENCOUNTER — Encounter (HOSPITAL_COMMUNITY)
Admission: AD | Disposition: A | Discharge status: Home / Self Care | Payer: Self-pay | Source: Ambulatory Visit | Attending: Vascular Surgery

## 2011-05-28 ENCOUNTER — Other Ambulatory Visit: Payer: Self-pay | Admitting: *Deleted

## 2011-05-28 ENCOUNTER — Ambulatory Visit (HOSPITAL_COMMUNITY): Admit: 2011-05-28 | Payer: Medicare Other | Admitting: Vascular Surgery

## 2011-05-28 DIAGNOSIS — Z01810 Encounter for preprocedural cardiovascular examination: Secondary | ICD-10-CM

## 2011-05-28 DIAGNOSIS — I739 Peripheral vascular disease, unspecified: Secondary | ICD-10-CM

## 2011-05-28 DIAGNOSIS — I70269 Atherosclerosis of native arteries of extremities with gangrene, unspecified extremity: Secondary | ICD-10-CM

## 2011-05-28 HISTORY — PX: ABDOMINAL AORTAGRAM: SHX5454

## 2011-05-28 HISTORY — PX: CARDIAC CATHETERIZATION: SHX172

## 2011-05-28 LAB — GLUCOSE, CAPILLARY
Glucose-Capillary: 147 mg/dL — ABNORMAL HIGH (ref 70–99)
Glucose-Capillary: 192 mg/dL — ABNORMAL HIGH (ref 70–99)
Glucose-Capillary: 175 mg/dL — ABNORMAL HIGH (ref 70–99)

## 2011-05-28 LAB — CREATININE, SERUM
GFR calc Af Amer: 34 mL/min — ABNORMAL LOW (ref >90)
GFR calc non Af Amer: 30 mL/min — ABNORMAL LOW (ref >90)

## 2011-05-28 LAB — BASIC METABOLIC PANEL
Calcium: 9.2 mg/dL (ref 8.4–10.5)
GFR calc non Af Amer: 28 mL/min — ABNORMAL LOW (ref >90)
Glucose, Bld: 158 mg/dL — ABNORMAL HIGH (ref 70–99)
Sodium: 137 mEq/L (ref 135–145)

## 2011-05-28 LAB — CBC
HCT: 34.1 % — ABNORMAL LOW (ref 39.0–52.0)
Hemoglobin: 11.2 g/dL — ABNORMAL LOW (ref 13.0–17.0)
WBC: 7.2 10*3/uL (ref 4.0–10.5)

## 2011-05-28 SURGERY — ABDOMINAL AORTAGRAM
Anesthesia: LOCAL

## 2011-05-28 MED ORDER — OXYCODONE HCL 5 MG PO CAPS: 5.0000 mg | ORAL_CAPSULE | Freq: Four times a day (QID) | ORAL | Status: AC | PRN

## 2011-05-28 MED ORDER — PANTOPRAZOLE SODIUM 40 MG PO TBEC: 40.0000 mg | DELAYED_RELEASE_TABLET | Freq: Every day | ORAL | Status: DC

## 2011-05-28 MED ORDER — HEPARIN (PORCINE) IN NACL 2-0.9 UNIT/ML-% IJ SOLN
INTRAMUSCULAR | Status: AC
  Filled 2011-05-28: qty 1000

## 2011-05-28 MED ORDER — PHENOL 1.4 % MT LIQD: 1.0000 | OROMUCOSAL | Status: DC | PRN

## 2011-05-28 MED ORDER — METOPROLOL TARTRATE 1 MG/ML IV SOLN: 2.0000 mg | INTRAVENOUS | Status: DC | PRN

## 2011-05-28 MED ORDER — HEPARIN SODIUM (PORCINE) 5000 UNIT/ML IJ SOLN
5000.0000 [IU] | Freq: Three times a day (TID) | INTRAMUSCULAR | Status: DC
  Administered 2011-05-28 – 2011-05-29 (×3): 5000 [IU] via SUBCUTANEOUS
  Filled 2011-05-28 (×6): qty 1

## 2011-05-28 MED ORDER — DOCUSATE SODIUM 100 MG PO CAPS
100.0000 mg | ORAL_CAPSULE | Freq: Every day | ORAL | Status: DC
  Administered 2011-05-29: 100 mg via ORAL
  Filled 2011-05-28: qty 1

## 2011-05-28 MED ORDER — HEPARIN SODIUM (PORCINE) 5000 UNIT/ML IJ SOLN
5000.0000 [IU] | Freq: Three times a day (TID) | INTRAMUSCULAR | Status: DC
  Filled 2011-05-28 (×3): qty 1

## 2011-05-28 MED ORDER — GUAIFENESIN-DM 100-10 MG/5ML PO SYRP: 15.0000 mL | ORAL_SOLUTION | ORAL | Status: DC | PRN

## 2011-05-28 MED ORDER — HYDRALAZINE HCL 20 MG/ML IJ SOLN: 10.0000 mg | INTRAMUSCULAR | Status: DC | PRN

## 2011-05-28 MED ORDER — ONDANSETRON HCL 4 MG/2ML IJ SOLN: 4.0000 mg | Freq: Four times a day (QID) | INTRAMUSCULAR | Status: DC | PRN

## 2011-05-28 MED ORDER — LABETALOL HCL 5 MG/ML IV SOLN: 10.0000 mg | INTRAVENOUS | Status: DC | PRN

## 2011-05-28 MED ORDER — LIDOCAINE HCL (PF) 1 % IJ SOLN
INTRAMUSCULAR | Status: AC
  Filled 2011-05-28: qty 30

## 2011-05-28 NOTE — Progress Notes (Signed)
VASCULAR LAB PRELIMINARY   Left Lower Extremity Vein Map    Left Great Saphenous Vein   Segment Diameter Comment  1. Origin 4.29mm 10.66mm deep  2. High Thigh 3.39mm 11.47mm deep  3. Mid Thigh 3.45mm Branch  8.24mm deep  4. Low Thigh 2.69mm 4.33mm deep  5. At Knee 2.58mm 4.47mm deep  6. High Calf 1.22mm Branch 1.31mm deep  7. Low Calf 1.57mm Branch 1.43mm deep  8. Ankle 2.63mm 3.41mm deep   mm    mm    mm     Left Small Saphenous Vein  Segment Diameter Comment   mm   1. High Calf 2.13mm 4.44mm deep  2. Low Calf 2.75mm 2.60mm deep  3. Ankle 2.69mm 1.64mm deep   mm    mm    mm     Farrel Demark, RDMS

## 2011-05-28 NOTE — H&P (View-Only) (Signed)
MEDICATION RELATED CONSULT NOTE - INITIAL   Pharmacy Consult for Bicarbonate protocol Indication: angiogram on 05/28/11  No Known Allergies  Patient Measurements: Height: 5\' 8"  (172.7 cm) Weight: 201 lb 12.8 oz (91.536 kg) IBW/kg (Calculated) : 68.4   Vital Signs: Temp: 98.6 F (37 C) (05/16 1201) Temp src: Oral (05/16 1201) BP: 155/69 mmHg (05/16 1201) Pulse Rate: 66  (05/16 1201)    Labs: Estimated Creatinine Clearance: 27.1 ml/min (by C-G formula based on Cr of 2.74).   Microbiology: No results found for this or any previous visit (from the past 720 hour(s)).  Medical History: Past Medical History  Diagnosis Date  . Diabetes mellitus   . Coronary artery disease   . Prostate cancer 2010    Medications:  Prescriptions prior to admission  Medication Sig Dispense Refill  . amLODipine (NORVASC) 10 MG tablet Take 1 tablet (10 mg total) by mouth daily.  30 tablet  0  . aspirin EC 81 MG tablet Take 81 mg by mouth daily.        . insulin aspart (NOVOLOG) 100 UNIT/ML injection Inject 10-12 Units into the skin See admin instructions. USES 10 UNITS IN THE MORNING 10 UNITS AT LUNCH AND 12 UNITS AT SUPPER      . insulin detemir (LEVEMIR) 100 UNIT/ML injection Inject 26 Units into the skin 2 (two) times daily.       . metoprolol tartrate (LOPRESSOR) 25 MG tablet Take 2 tablets (50 mg total) by mouth 2 (two) times daily.  60 tablet  0  . polyethylene glycol (MIRALAX / GLYCOLAX) packet Take 17 g by mouth daily.      . pravastatin (PRAVACHOL) 80 MG tablet Take 80 mg by mouth daily.        . pregabalin (LYRICA) 75 MG capsule Take 75 mg by mouth 2 (two) times daily.       Scheduled:    . amLODipine  10 mg Oral Daily  . aspirin EC  81 mg Oral Daily  . insulin aspart  0-15 Units Subcutaneous TID WC  . insulin detemir  26 Units Subcutaneous BID  . metoprolol tartrate  50 mg Oral BID  . pantoprazole  40 mg Oral Q1200  . polyethylene glycol  17 g Oral Daily  . pregabalin  75 mg Oral  BID  . simvastatin  40 mg Oral q1800    Assessment: 71 yo male w/ dry gangrene of his left fifth toe here for arteriogram on 5/17 to start a bicarbonate protocol for renal protection. Mucomyst was also requested but use is restricted to pulmonary indications only due to a national drug shortage.   Plan:  -Will begin sodium bicarbonate protocol one hour prior to procedure on 05/28/11 -Unable to provide mucomyst due to a national drug shortage  Will sign off for now. Thank you for asking pharmacy to be involved in the care of this patient.  Harland German, Pharm D 05/27/2011 12:45 PM

## 2011-05-28 NOTE — Interval H&P Note (Signed)
History and Physical Interval Note:  05/28/2011 7:08 AM  Aaron Long  has presented today for surgery, with the diagnosis of PVD  The various methods of treatment have been discussed with the patient and family. After consideration of risks, benefits and other options for treatment, the patient has consented to  Procedure(s) (LRB): ABDOMINAL AORTAGRAM (N/A) as a surgical intervention .  The patients' history has been reviewed, patient examined, no change in status, stable for surgery.  I have reviewed the patients' chart and labs.  Questions were answered to the patient's satisfaction.     Aaron Long

## 2011-05-28 NOTE — Discharge Summary (Signed)
[facility] Discharge Summary  KEDRICK MCNAMEE 03/24/1940 71 y.o. male  [id number]  Admission Date: 05/27/2011  Discharge Date: 05/29/11  Physician: Sherren Kerns, MD  Admission Diagnosis: PAD gangrene left 5th toe   HPI:   This is a 71 y.o. male with diabetes type 2 and peripheral neuropathy. The patient states that 2 days ago he noticed a scab on his left toe. He states that he did not think that it was much of anything. However today he had difficulty walking on his foot and noticed that the lesion on the had worsene. and the foot was red. Patient states that he tried to see his primary care physician today for care however they were unable to accommodate him in the clinic today. He states that the pain was such that he felt he needed to come to the emergency room for further evaluation and management.  He denies any fever any fever/chills any nausea vomiting or diarrhea. He denies any chest pain, cough, dizziness, dysuria.The patient does state that he has difficulty feeling in his lower extremities.  The patient is from [facility] where he sees Dr. Arville Care as his primary care physician.   Hospital Course:  The patient was admitted to the hospital and taken to the operating room on  05/28/2011 and underwent aortogram.  The pt tolerated the procedure well and was transported to the PACU in good condition. He was admitted to the hospital afterward for hydration as his creatinine was 2.1.  Operative findings:  Left leg- patent inflow, 50% SFA, patent profunda, occluded popliteal, 1 vessel runoff via posterior tibial artery.  There is an incomplete plantar arch.   Operative management: The patient will be scheduled for a left femoral to posterior tibial bypass in the near future. We will vein map him later today (RESULTS BELOW). He will need cardiac risk stratification.  Pt's creatinine is slightly elevated from post procedure, but is baseline from  pre-procecdure and has good UOP.  The remainder of the hospital course consisted of increasing ambulation and increasing intake of solids without difficulty.  CBC    Component Value Date/Time   WBC 7.2 05/28/2011 1107   RBC 3.75* 05/28/2011 1107   HGB 11.2* 05/28/2011 1107   HCT 34.1* 05/28/2011 1107   PLT 176 05/28/2011 1107   MCV 90.9 05/28/2011 1107   MCH 29.9 05/28/2011 1107   MCHC 32.8 05/28/2011 1107   RDW 13.3 05/28/2011 1107   LYMPHSABS 2.3 04/30/2011 0341   MONOABS 0.6 04/30/2011 0341   EOSABS 0.2 04/30/2011 0341   BASOSABS 0.1 04/30/2011 0341    BMET    Component Value Date/Time   NA 137 05/28/2011 0530   K 4.5 05/28/2011 0530   CL 102 05/28/2011 0530   CO2 26 05/28/2011 0530   GLUCOSE 158* 05/28/2011 0530   BUN 28* 05/28/2011 0530   CREATININE 2.13* 05/28/2011 1107   CALCIUM 9.2 05/28/2011 0530   GFRNONAA 30* 05/28/2011 1107   GFRAA 34* 05/28/2011 1107   . BMET    Component Value Date/Time   NA 140 05/29/2011 0547   K 4.7 05/29/2011 0547   CL 104 05/29/2011 0547   CO2 28 05/29/2011 0547   GLUCOSE 130* 05/29/2011 0547   BUN 26* 05/29/2011 0547   CREATININE 2.20* 05/29/2011 0547   CALCIUM 9.2 05/29/2011 0547   GFRNONAA 28* 05/29/2011 0547   GFRAA 33* 05/29/2011 0547      Discharge Instructions:   The patient is discharged  to home with extensive instructions on wound care and progressive ambulation.  They are instructed not to drive or perform any heavy lifting until returning to see the physician in his office.  Discharge Orders    Future Appointments: Provider: Department: Dept Phone: Center:   06/04/2011 9:00 AM [facility] [id number] [facility]     Future Orders Please Complete By Expires   Resume previous diet      Driving Restrictions      Comments:   No driving for while taking pain medication   Lifting restrictions      Comments:   No lifting for 6 weeks   Call MD for:  temperature >100.5      Call MD for:  redness, tenderness, or signs of infection  (pain, swelling, bleeding, redness, odor or green/yellow discharge around incision site)      Call MD for:  severe or increased pain, loss or decreased feeling  in affected limb(s)      may wash over wound with mild soap and water      Scheduling Instructions:   Shower daily with soap and water starting 05/29/11       Discharge Diagnosis:  PAD gangrene left 5th toe  Secondary Diagnosis: Patient Active Problem List  Diagnoses  . Diabetic foot ulcer associated with type 2 diabetes mellitus  . HTN (hypertension)  . Diabetes mellitus type 2 with complications  . CAD (coronary artery disease)  . PAD (peripheral artery disease)  . Prostate cancer  . Gangrene due to secondary diabetes mellitus  . Acute kidney injury  . CKD (chronic kidney disease) stage 3, GFR 30-59 ml/min  . Cellulitis  . Diabetic toe ulcer   Past Medical History  Diagnosis Date  . Diabetes mellitus   . Coronary artery disease   . Prostate cancer 2010  . PAD (peripheral artery disease)   . Gangrene of toe     dry  . Myocardial infarction   . Hypertension   . Neuromuscular disorder     diabetic neuropathy      Lizandro, Spellman  Home Medication Instructions [id number]   Printed on:05/28/11 1230  Medication Information                    pravastatin (PRAVACHOL) 80 MG tablet Take 80 mg by mouth daily.             insulin detemir (LEVEMIR) 100 UNIT/ML injection Inject 26 Units into the skin 2 (two) times daily.            aspirin EC 81 MG tablet Take 81 mg by mouth daily.             pregabalin (LYRICA) 75 MG capsule Take 75 mg by mouth 2 (two) times daily.           polyethylene glycol (MIRALAX / GLYCOLAX) packet Take 17 g by mouth daily.           metoprolol tartrate (LOPRESSOR) 25 MG tablet Take 50 mg by mouth 2 (two) times daily.           amLODipine (NORVASC) 10 MG tablet Take 10 mg by mouth daily.           insulin aspart (NOVOLOG) 100 UNIT/ML injection Inject 2-4 Units into the skin  3 (three) times daily before meals. Per sliding scale           oxycodone (OXYIR) 5 MG capsule Take 1 capsule (5 mg total) by  mouth every 6 (six) hours as needed. #10 NR             VEIN MAPPING 05/28/11  Left Great Saphenous Vein  Segment  Diameter  Comment   1. Origin  4.6mm  10.13mm deep   2. High Thigh  3.44mm  11.22mm deep   3. Mid Thigh  3.86mm  Branch  8.27mm deep   4. Low Thigh  2.19mm  4.7mm deep   5. At Knee  2.52mm  4.53mm deep   6. High Calf  1.34mm  Branch  1.60mm deep   7. Low Calf  1.11mm  Branch  1.22mm deep   8. Ankle  2.35mm  3.48mm deep    mm     mm     mm    Left Small Saphenous Vein  Segment  Diameter  Comment    mm    1. High Calf  2.61mm  4.53mm deep   2. Low Calf  2.67mm  2.60mm deep   3. Ankle  2.30mm  1.9mm deep    mm     mm     mm     Disposition: home  Patient's condition: is Good  Follow up: 1. Dr. Darrick Penna in 1-2 weeks (after cards appt) 2. Cardiology for risk stratification-our office will arrange appt.   Doreatha Massed, PA-C [facility] [phone] 05/28/2011  12:30 PM

## 2011-05-28 NOTE — Op Note (Signed)
Procedure: Aortogram with bilateral lower extremity runoff  Preoperative diagnosis: Gangrene left foot  Postoperative diagnosis: Same  Anesthesia Local  Total Contrast 58 mL  Operative details: After obtaining informed consent, the patient was taken to the PV LAB. The patient was placed in supine position on the Angio table. Both groins were prepped and draped in usual sterile fashion. Local anesthesia was infiltrated over the right common femoral artery. Initially, ultrasound was used to identify the common femoral artery.  An introducer needle was used to cannulate the right common femoral artery and 035 versacore wire threaded into the abdominal aorta under fluoroscopic guidance. Next a 5 French sheath is placed over the guidewire in the right common femoral artery. A 5 French pigtail catheter was placed over the guidewire into the abdominal aorta and abdominal aortogram was obtained using CO2.   The infrarenal abdominal aorta is patent. The left and right common internal and external iliac arteries are patent. The renal arteries were not well visualized.   In order to get increased opacification of the tibials a 5 Fr crossover catheter was brought up on the field.  The crossover catheter was used to selectively catheterize the left common iliac artery and the guidewire advanced into the left distal external iliac artery. The crossover catheter was removed and replaced with a 5 French straight catheter. Angiogram was then performed the right lower extremity again using CO2.   In the left leg, the common femoral artery is patent.  The left SFA is patent but the origin has a calcified hazy stenosis estimated 50 percent.  The profunda is patent.  At this point CO2 opacification became poor so we switched to standard contrast.  An angled glidwire was brought onto the field and advanced to the mid SFA and the 5 Fr straight catheter placed over this and addition lower extremity runoff was performed.  The  popliteal artery is occluded.  The SFA is diffusely diseased.   The posterior tibial artery reconstitutes in the distal leg via SFA and profunda collaterals.  The peroneal artery is occluded.  The anterior tibial artery is occluded. There is an incomplete plantar arch.    The 5 Fr sheath was left in place to be pulled in the holding area. The patient tolerated the procedure well and there were no complications. Patient was taken to the holding area in stable condition.  Operative findings: Left leg- patent inflow, 50% SFA, patent profunda, occluded popliteal, 1 vessel runoff via posterior tibial artery There is an incomplete plantar arch.     Operative management: The patient will be scheduled for a left femoral to posterior tibial bypass in the near future.  We will vein map him later today.  He will need cardiac risk stratification  Fabienne Bruns, MD [facility] [place] Office: [phone] Pager: [phone]

## 2011-05-28 NOTE — Progress Notes (Signed)
Utilization Review Completed.Aaron Long T5/17/2013   

## 2011-05-29 DIAGNOSIS — I70269 Atherosclerosis of native arteries of extremities with gangrene, unspecified extremity: Secondary | ICD-10-CM | POA: Diagnosis not present

## 2011-05-29 LAB — BASIC METABOLIC PANEL
Calcium: 9.2 mg/dL (ref 8.4–10.5)
GFR calc Af Amer: 33 mL/min — ABNORMAL LOW (ref >90)
GFR calc non Af Amer: 28 mL/min — ABNORMAL LOW (ref >90)
Sodium: 140 mEq/L (ref 135–145)

## 2011-05-29 NOTE — Progress Notes (Signed)
[facility] Progress Note  05/29/2011 9:41 AM POD 1  Subjective:  Ready to go home.  Afebrile x 24 hours Filed Vitals:   05/29/11 0430  BP: 164/77  Pulse: 72  Temp: 98.5 F (36.9 C)  Resp: 19    Physical Exam: Incisions:  Right groin soft without hematoma. Extremities:  + right DP doppler signal; + left PT signal  CBC    Component Value Date/Time   WBC 7.2 05/28/2011 1107   RBC 3.75* 05/28/2011 1107   HGB 11.2* 05/28/2011 1107   HCT 34.1* 05/28/2011 1107   PLT 176 05/28/2011 1107   MCV 90.9 05/28/2011 1107   MCH 29.9 05/28/2011 1107   MCHC 32.8 05/28/2011 1107   RDW 13.3 05/28/2011 1107   LYMPHSABS 2.3 04/30/2011 0341   MONOABS 0.6 04/30/2011 0341   EOSABS 0.2 04/30/2011 0341   BASOSABS 0.1 04/30/2011 0341    BMET    Component Value Date/Time   NA 140 05/29/2011 0547   K 4.7 05/29/2011 0547   CL 104 05/29/2011 0547   CO2 28 05/29/2011 0547   GLUCOSE 130* 05/29/2011 0547   BUN 26* 05/29/2011 0547   CREATININE 2.20* 05/29/2011 0547   CALCIUM 9.2 05/29/2011 0547   GFRNONAA 28* 05/29/2011 0547   GFRAA 33* 05/29/2011 0547    INR    Component Value Date/Time   INR 1.03 05/27/2011 1250     Intake/Output Summary (Last 24 hours) at 05/29/11 0941 Last data filed at 05/29/11 0744  Gross per 24 hour  Intake   1920 ml  Output   2125 ml  Net   -205 ml     Assessment/Plan:  71 y.o. male is s/p Aortogram with bilateral lower extremity runoff  POD 1 -despite IVF overnight, pt's creatinine up slightly from post procedure but at baseline from yesterday morning and has good UOP -return to see Dr. Darrick Penna in 1-2 weeks to pre-op for surgery as the pt needs a left fem to post tib bypass in the near future.  Vein mapping results in d/c summary. -pt needs cardiac risk stratification-will have our office set up appt and pt to return to see Dr. Darrick Penna after cardiology appt.   Doreatha Massed, PA-C [facility] [phone] 05/29/2011 9:41 AM

## 2011-05-29 NOTE — Progress Notes (Signed)
Discharge instructions reviewed with patient and his spouse. Medications/ prescriptions reviewed. Precaution and additional resources reviewed. Medication teaching done and verbalized understanding. Prescriptions and copy of instructions given to patient. To door via wheelchair. Home via vehicle with his wife.

## 2011-05-29 NOTE — Progress Notes (Signed)
Newton Pigg, PA called to say Dr. Edilia Bo is OK with patient discharge. Thomas Hoff

## 2011-05-31 ENCOUNTER — Other Ambulatory Visit: Payer: Self-pay | Admitting: *Deleted

## 2011-05-31 ENCOUNTER — Telehealth: Payer: Self-pay | Admitting: Vascular Surgery

## 2011-05-31 DIAGNOSIS — I70219 Atherosclerosis of native arteries of extremities with intermittent claudication, unspecified extremity: Secondary | ICD-10-CM

## 2011-05-31 DIAGNOSIS — Z01810 Encounter for preprocedural cardiovascular examination: Secondary | ICD-10-CM

## 2011-05-31 NOTE — Telephone Encounter (Addendum)
Message copied by Shari Prows on Mon May 31, 2011 10:26 AM ------      Message from: Phillips Odor      Created: Fri May 28, 2011 12:33 PM      Regarding: needs cardiology appt.                   ----- Message -----         From: Sherren Kerns, MD         Sent: 05/28/2011   8:33 AM           To: Almetta Lovely, RN            3rd order cath left SFA, ultrasound was       Right groin punture      Aortogram with runoff            He need left posterior tibial bypass, schedule as outpt      He needs preop cardiac risk stratification            Charles Patient is established w/ Dr.VanTright at [facility] but they require a faxed referral. I faxed referral and am awaiting a phone call for scheduled appt for this pt. Jacklyn Shell

## 2011-05-31 NOTE — Telephone Encounter (Signed)
Spoke with pts wife, notified of 06/01/11 @ 11:00 appt with Dr Clifton James @ [place] for cardiac pre-op evaluation.

## 2011-05-31 NOTE — Telephone Encounter (Signed)
Message copied by Fredrich Birks on Mon May 31, 2011  4:14 PM ------      Message from: Melene Plan      Created: Mon May 31, 2011  2:39 PM                   ----- Message -----         From: Dara Lords, PA         Sent: 05/29/2011   9:52 AM           To: Melene Plan, RN            This pt had an aortogram Friday.  He is d/c'd on Sat.  He is to have a fem tib bypass in 2-3 weeks by [facility].            He also needs an appt with cards for cardiac risk stratification and return to see Dr. Darrick Penna after that appt.            Thanks,      Lelon Mast

## 2011-06-01 ENCOUNTER — Ambulatory Visit (INDEPENDENT_AMBULATORY_CARE_PROVIDER_SITE_OTHER): Payer: Medicare Other | Admitting: Cardiovascular Disease

## 2011-06-01 ENCOUNTER — Encounter: Payer: Self-pay | Admitting: Cardiovascular Disease

## 2011-06-01 VITALS — BP 150/80 | HR 68 | Ht 68.0 in | Wt 212.8 lb

## 2011-06-01 DIAGNOSIS — I2510 Atherosclerotic heart disease of native coronary artery without angina pectoris: Secondary | ICD-10-CM

## 2011-06-01 DIAGNOSIS — Z01810 Encounter for preprocedural cardiovascular examination: Secondary | ICD-10-CM | POA: Diagnosis not present

## 2011-06-01 NOTE — Assessment & Plan Note (Signed)
He has known CAD with previous bypass in 2007 and no ischemic testing since then. He has no chest pains or SOB but is limited by his left leg pain and neuropathy so he is inactive. He is a diabetic. I think stress testing is indicated before his major vascular surgery with general anesthesia. We will arrange a Lexiscan stress myoview to exclude ischemia. If the stress test is ok, can proceed with the surgery. I will update Dr. Darrick Penna after the stress test is done. If the stress test is abnormal, he will need cardiac cath but this will be tricky with his renal insufficiency. I will plan on seeing him back after the stress test but if normal, will move appt to 6 months.

## 2011-06-01 NOTE — Patient Instructions (Signed)
Your physician recommends that you schedule a follow-up appointment on Jun 08, 2011  Your physician has requested that you have a lexiscan myoview. For further information please visit [email or web address] Please follow instruction sheet, as given.

## 2011-06-01 NOTE — Progress Notes (Signed)
History of Present Illness: 71 yo male with history of DM, CAD s/p CABG, prostate cancer, PAD, HTN here today for cardiac risk assessment prior to planned left femoral to posterior tibial artery bypass per Dr. Fabienne Bruns. He had a runoff of the left lower extremity 05/28/11 per Dr. Darrick Penna and was found to have total occlusion of the left SFA and popliteal artery with one vessel runoff to the left foot via the posterior tibial artery. He had 4V CABG 04/15/05 per Dr. Donata Clay. (left internal mammary artery  to LAD, saphenous vein graft to optional diagonal, saphenous vein graft to  the obtuse marginal, saphenous vein graft to distal RCA). He had been followed by [facility] in [place]. He has not been seen in their office in several years.   He tells me that he has been feeling well. He has had no chest pain, SOB or palpitations. He is limited by cramping in his left leg and because of neuropathic pain in his feet. He is inactive.    Primary Care Physician: Marquis Buggy  Past Medical History  Diagnosis Date  . Diabetes mellitus   . Coronary artery disease   . Prostate cancer 2010  . PAD (peripheral artery disease)   . Gangrene of toe     dry  . Myocardial infarction   . Hypertension   . Neuromuscular disorder     diabetic neuropathy    Past Surgical History  Procedure Date  . Coronary artery bypass graft 4/07  . Prostate surgery   . Knee cartilage surgery     left knee  . Cea     Right    Current Outpatient Prescriptions  Medication Sig Dispense Refill  . amLODipine (NORVASC) 10 MG tablet Take 10 mg by mouth daily.      [name] aspirin EC 81 MG tablet Take 81 mg by mouth daily.        . insulin aspart (NOVOLOG) 100 UNIT/ML injection Inject 2-4 Units into the skin. Per sliding scale      . insulin detemir (LEVEMIR) 100 UNIT/ML injection Inject 26 Units into the skin 2 (two) times daily.       . metoprolol tartrate (LOPRESSOR) 25 MG tablet Take 50 mg by mouth 2 (two) times daily.       [name] oxycodone (OXYIR) 5 MG capsule Take 1 capsule (5 mg total) by mouth every 6 (six) hours as needed.  10 capsule  0  . polyethylene glycol (MIRALAX / GLYCOLAX) packet Take 17 g by mouth daily.      . pravastatin (PRAVACHOL) 80 MG tablet Take 80 mg by mouth daily.        . pregabalin (LYRICA) 75 MG capsule Take 75 mg by mouth 2 (two) times daily.        No Known Allergies  History   Social History  . Marital Status: Married    Spouse Name: N/A    Number of Children: 3  . Years of Education: N/A   Occupational History  . Retired-[employer]    Social History Main Topics  . Smoking status: Former Smoker -- 1.0 packs/day for 40 years    Types: Cigarettes    Quit date: 04/28/1991  . Smokeless tobacco: Never Used  . Alcohol Use: No     occasional  . Drug Use: No  . Sexually Active: Not Currently   Other Topics Concern  . Not on file   Social History Narrative  . No narrative on file  Family History  Problem Relation Age of Onset  . Coronary artery disease Neg Hx     Review of Systems:  As stated in the HPI and otherwise negative.   BP 150/80  Pulse 68  Ht 5\' 8"  (1.727 m)  Wt 212 lb 12.8 oz (96.525 kg)  BMI 32.36 kg/m2  Physical Examination: General: Well developed, well nourished, NAD HEENT: OP clear, mucus membranes moist SKIN: warm, dry. No rashes. Neuro: No focal deficits Musculoskeletal: Muscle strength 5/5 all ext Psychiatric: Mood and affect normal Neck: No JVD, no carotid bruits, no thyromegaly, no lymphadenopathy. Lungs:Clear bilaterally, no wheezes, rhonci, crackles Cardiovascular: Regular rate and rhythm. No murmurs, gallops or rubs. Abdomen:Soft. Bowel sounds present. Non-tender.  Extremities: 1+ bilateral lower extremity edema. Pulses are non-palpable in the bilateral DP/PT.  Left 5th toe with eschar.

## 2011-06-03 ENCOUNTER — Ambulatory Visit (HOSPITAL_COMMUNITY): Payer: Medicare Other | Attending: Cardiology | Admitting: Radiology

## 2011-06-03 ENCOUNTER — Telehealth: Payer: Self-pay | Admitting: Cardiovascular Disease

## 2011-06-03 ENCOUNTER — Ambulatory Visit: Payer: Medicare Other | Admitting: Vascular Surgery

## 2011-06-03 VITALS — BP 149/73 | Ht 68.0 in | Wt 207.0 lb

## 2011-06-03 DIAGNOSIS — I2510 Atherosclerotic heart disease of native coronary artery without angina pectoris: Secondary | ICD-10-CM

## 2011-06-03 MED ORDER — TECHNETIUM TC 99M TETROFOSMIN IV KIT
33.0000 | PACK | Freq: Once | INTRAVENOUS | Status: AC | PRN
  Administered 2011-06-03: 33 via INTRAVENOUS

## 2011-06-03 MED ORDER — REGADENOSON 0.4 MG/5ML IV SOLN
0.4000 mg | Freq: Once | INTRAVENOUS | Status: AC
  Administered 2011-06-03: 0.4 mg via INTRAVENOUS

## 2011-06-03 MED ORDER — TECHNETIUM TC 99M TETROFOSMIN IV KIT
11.0000 | PACK | Freq: Once | INTRAVENOUS | Status: AC | PRN
  Administered 2011-06-03: 11 via INTRAVENOUS

## 2011-06-03 NOTE — Progress Notes (Signed)
[facility] SITE 3 NUCLEAR MED [address] [place] [address] [phone]  Cardiology Nuclear Med Study  Aaron Long is a 71 y.o. male     MRN : [id number]     DOB: [birth year]  Procedure Date: 06/03/2011  Nuclear Med Background Indication for Stress Test:  Evaluation for Ischemia, Pending Surgical Clearance: Left fem.-post.tibial artery bypass- Dr. Kelvin Cellar and Graft Patency History:  '07 MI-Heart Cath:EF 40%-CABG-ECHO: EF: 55% mild MR mild TR Cardiac Risk Factors: Carotid Disease, History of Smoking, Hypertension, IDDM Type 2, Lipids and PVD  Symptoms:  Fatigue   Nuclear Pre-Procedure Caffeine/Decaff Intake:  None NPO After: 11:00am   Lungs:  clear O2 Sat: 96% on room air. IV 0.9% NS with Angio Cath:  20g  IV Site: R Forearm  IV Started by:  Stanton Kidney, EMT-P  Chest Size (in):  44 Cup Size: n/a  Height: 5\' 8"  (1.727 m)  Weight:  207 lb (93.895 kg)  BMI:  Body mass index is 31.47 kg/(m^2). Tech Comments:  CBG=88@10am , per patient. Lopressor was taken as directed.    Nuclear Med Study 1 or 2 day study: 1 day  Stress Test Type:  Eugenie Birks  Reading MD: Willa Rough, MD  Order Authorizing Provider:  C.[name]  Resting Radionuclide: Technetium 12m Tetrofosmin  Resting Radionuclide Dose: 11.0 mCi   Stress Radionuclide:  Technetium 66m Tetrofosmin  Stress Radionuclide Dose: 33.0 mCi           Stress Protocol Rest HR: 66 Stress HR: 75  Rest BP: 149/73 Stress BP: 158/66  Exercise Time (min): n/a METS: n/a   Predicted Max HR: 149 bpm % Max HR: 50.34 bpm Rate Pressure Product: 21308   Dose of Adenosine (mg):  n/a Dose of Lexiscan: 0.4 mg  Dose of Atropine (mg): n/a Dose of Dobutamine: n/a mcg/kg/min (at max HR)  Stress Test Technologist: Milana Na, EMT-P  Nuclear Technologist:  Domenic Polite, CNMT     Rest Procedure:  Myocardial perfusion imaging was performed at rest 45 minutes following the intravenous administration of Technetium 33m  Tetrofosmin. Rest ECG: NSR with non-specific ST-T wave changes  Stress Procedure:  The patient received IV Lexiscan 0.4 mg over 15-seconds.  Technetium 59m Tetrofosmin injected at 30-seconds.  There were no significant changes and sob with Lexiscan.  Quantitative spect images were obtained after a 45 minute delay. Stress ECG: No significant change from baseline ECG  QPS Raw Data Images:  Patient motion noted; appropriate software correction applied. Stress Images:  Normal homogeneous uptake in all areas of the myocardium. Rest Images:  Normal homogeneous uptake in all areas of the myocardium. Subtraction (SDS):  No evidence of ischemia. Transient Ischemic Dilatation (Normal <1.22):  1.05 Lung/Heart Ratio (Normal <0.45):  0.35  Quantitative Gated Spect Images QGS EDV:  117 ml QGS ESV:  57 ml  Impression Exercise Capacity:  Lexiscan with no exercise. BP Response:  Normal blood pressure response. Clinical Symptoms:  shortness of breath ECG Impression:  No significant ST segment change suggestive of ischemia. Comparison with Prior Nuclear Study: No previous nuclear study performed  Overall Impression:  There is no evidence of scar or ischemia. There is septal dyssynergy compatible with the history of CABG.  LV Ejection Fraction: 51%.  LV Wall Motion:  There is dyssynergy of the septum compatible with history of CABG  Willa Rough, MD

## 2011-06-03 NOTE — Telephone Encounter (Signed)
Pt's wife wanting to know when stress test will be read by dr Ellene Route to call back tues. 5/31--wife agrees

## 2011-06-03 NOTE — Telephone Encounter (Signed)
New problem:  Patient wife [name] calling from the office. Pt is schedule for stress test today. Wants to know how long it will be before the test results are read.

## 2011-06-04 ENCOUNTER — Other Ambulatory Visit: Payer: Medicare Other

## 2011-06-04 ENCOUNTER — Telehealth: Payer: Self-pay | Admitting: *Deleted

## 2011-06-04 NOTE — Telephone Encounter (Signed)
lmom stress test ok and faxed over to Dr. Darrick Penna

## 2011-06-04 NOTE — Telephone Encounter (Signed)
Message copied by Tarri Fuller on Fri Jun 04, 2011  3:39 PM ------      Message from: Verne Carrow D      Created: Fri Jun 04, 2011  2:54 PM       Stress test ok. Can we let the patient know? I will forward to Dr. Darrick Penna who is planning his surgery. Thanks, chris

## 2011-06-08 ENCOUNTER — Ambulatory Visit: Payer: Medicare Other | Admitting: Cardiovascular Disease

## 2011-06-08 NOTE — Telephone Encounter (Signed)
Message was left on Jun 04, 2011 and results faxed to Dr. Darrick Penna

## 2011-06-09 ENCOUNTER — Other Ambulatory Visit: Payer: Self-pay

## 2011-06-09 ENCOUNTER — Emergency Department (HOSPITAL_COMMUNITY)
Admission: EM | Admit: 2011-06-09 | Discharge: 2011-06-10 | Disposition: A | Discharge status: Home / Self Care | Payer: Medicare Other | Attending: Emergency Medicine | Admitting: Emergency Medicine

## 2011-06-09 ENCOUNTER — Encounter: Payer: Self-pay | Admitting: Vascular Surgery

## 2011-06-09 ENCOUNTER — Encounter (HOSPITAL_COMMUNITY): Payer: Self-pay | Admitting: *Deleted

## 2011-06-09 DIAGNOSIS — M79609 Pain in unspecified limb: Secondary | ICD-10-CM | POA: Diagnosis not present

## 2011-06-09 DIAGNOSIS — M7989 Other specified soft tissue disorders: Secondary | ICD-10-CM | POA: Insufficient documentation

## 2011-06-09 MED ORDER — ACETAMINOPHEN 325 MG PO TABS
650.0000 mg | ORAL_TABLET | Freq: Once | ORAL | Status: AC
  Administered 2011-06-09: 650 mg via ORAL
  Filled 2011-06-09: qty 2

## 2011-06-09 NOTE — ED Notes (Signed)
Called pt phone.  Wife answered and stated that they went home. I urged wife to bring pt back and she stated that pt was refusing to return.

## 2011-06-09 NOTE — ED Notes (Signed)
Pt is scheduled to have an "operation on his L toe" on 6/5.  However, today the pain to his L 5th digit became unbearable and he began experiencing pain and swelling to his entire L foot.  Pt febrile and L 5th digit has foul odor and appears necrotic.

## 2011-06-10 ENCOUNTER — Encounter: Payer: Self-pay | Admitting: Vascular Surgery

## 2011-06-10 ENCOUNTER — Other Ambulatory Visit: Payer: Self-pay

## 2011-06-10 ENCOUNTER — Ambulatory Visit (INDEPENDENT_AMBULATORY_CARE_PROVIDER_SITE_OTHER): Payer: Medicare Other | Admitting: Vascular Surgery

## 2011-06-10 VITALS — BP 150/63 | HR 71 | Temp 98.1°F | Resp 18 | Ht 68.0 in | Wt 212.8 lb

## 2011-06-10 DIAGNOSIS — I70269 Atherosclerosis of native arteries of extremities with gangrene, unspecified extremity: Secondary | ICD-10-CM

## 2011-06-10 NOTE — Progress Notes (Signed)
[facility] [place]  HISTORY AND PHYSICAL  History of Present Illness: Patient is a 71 y.o. year old male who presents for follow-up evaluation for PAD. He is on Aspirin for antiplatelet therapy. He was recently seen as an inpatient consult for dry gangrene of his left fifth toe. At that time he also had renal insufficiency and was evaluated by the nephrology service. It was decided to defer his arteriogram until his kidney function can improve somewhat. He returns today for further followup. He has no pain in the left foot. He has had no drainage from the left toe. There was cellulitis in his left foot of admission this is now resolved. His atherosclerotic risk factors remain diabetes, elevated cholesterol, smoking( remote 1993), and coronary artery disease. These are all currently stable and followed by the primary care physician. He is also being followed by Dr. Caryn Section with nephrology. His recent arteriogram showed severe tibial disease but he did have a posterior tibial target.  Vein mapping showed reasonable vein at least to the knee.  He has had some increased smell and redness in the left foot over the past few days.  He recently saw Dr Rosalie Gums and had a negative stress test. Past Medical History   Diagnosis  Date   .  Diabetes mellitus    .  Coronary artery disease    .  Prostate cancer  2010    Past Surgical History   Procedure  Date   .  Cardiac surgery    .  Prostate surgery    .  Knee cartilage surgery      left knee   Review of Systems:  Neurologic: sensation in the feet is intact  Cardiac:denies shortness of breath or chest pain  Pulmonary: denies cough or wheeze  Social History  History   Substance Use Topics   .  Smoking status:  Former Smoker -- 40 years     Quit date:  04/28/1991   .  Smokeless tobacco:  Never Used   .  Alcohol Use:  Yes      occasional   Allergies  No Known Allergies  Current Outpatient Prescriptions   Medication  Sig  Dispense   Refill   .  amLODipine (NORVASC) 10 MG tablet  Take 1 tablet (10 mg total) by mouth daily.  30 tablet  0   .  aspirin EC 81 MG tablet  Take 81 mg by mouth daily.     .  insulin aspart (NOVOLOG) 100 UNIT/ML injection  Inject 10-12 Units into the skin See admin instructions. USES 10 UNITS IN THE MORNING 10 UNITS AT LUNCH AND 12 UNITS AT SUPPER     .  insulin detemir (LEVEMIR) 100 UNIT/ML injection  Inject 26 Units into the skin 2 (two) times daily.     .  metoprolol tartrate (LOPRESSOR) 25 MG tablet  Take 2 tablets (50 mg total) by mouth 2 (two) times daily.  60 tablet  0   .  pravastatin (PRAVACHOL) 80 MG tablet  Take 80 mg by mouth daily.     .  pregabalin (LYRICA) 75 MG capsule  Take 75 mg by mouth 2 (two) times daily.     Physical Examination  Filed Vitals:    05/20/11 1020  05/20/11 1041   BP:  178/72  180/72   Pulse:  71    Resp:  16    Height:  5\' 8"  (1.727 m)    Weight:  203 lb  1.6 oz (92.126 kg)    SpO2:  100%    Body mass index is 30.88 kg/(m^2).  General: Alert and oriented, no acute distress  HEENT: Normal  Neck: No bruit or JVD  Pulmonary: Clear to auscultation bilaterally  Cardiac: Regular Rate and Rhythm without murmur  Neurologic: Upper and lower extremity motor 5/5 and symmetric  Extremities: 2+ femoral absent popliteal and pedal pulses  Skin: no ulcer or rash, dry gangrene left 5th toe , with some left foot erythema laterally ASSESSMENT: Arterial occlusive disease with gangrene of left fifth toe, multiple additional comorbidities including renal dysfunction  PLAN: Left femoral to posterior tibial bypass with left 5 toe amputation Monday, June 3.  Pt was given a prescription for Levaquin and Percocet today. Risks benefits possible complications and procedure details were explained the patient and his wife today including but limited to worsening renal function, limb loss, bleeding, vessel injury. They understand and agree to proceed.  Fabienne Bruns, MD  [facility] [place]  Office: [phone]  Pager: [phone]

## 2011-06-11 ENCOUNTER — Encounter (HOSPITAL_COMMUNITY)
Admission: RE | Admit: 2011-06-11 | Discharge: 2011-06-11 | Disposition: A | Discharge status: Home / Self Care | Payer: Medicare Other | Source: Ambulatory Visit | Attending: Vascular Surgery | Admitting: Vascular Surgery

## 2011-06-11 ENCOUNTER — Encounter (HOSPITAL_COMMUNITY): Payer: Self-pay

## 2011-06-11 ENCOUNTER — Other Ambulatory Visit: Payer: Medicare Other

## 2011-06-11 HISTORY — DX: Unspecified hemorrhoids: K64.9

## 2011-06-11 HISTORY — DX: Constipation, unspecified: K59.00

## 2011-06-11 HISTORY — DX: Personal history of colonic polyps: Z86.010

## 2011-06-11 HISTORY — DX: Personal history of colon polyps, unspecified: Z86.0100

## 2011-06-11 HISTORY — DX: Polyneuropathy, unspecified: G62.9

## 2011-06-11 HISTORY — DX: Hyperlipidemia, unspecified: E78.5

## 2011-06-11 LAB — CBC
HCT: 33.1 % — ABNORMAL LOW (ref 39.0–52.0)
Hemoglobin: 10.6 g/dL — ABNORMAL LOW (ref 13.0–17.0)
MCH: 29.7 pg (ref 26.0–34.0)
RBC: 3.57 MIL/uL — ABNORMAL LOW (ref 4.22–5.81)

## 2011-06-11 LAB — URINALYSIS, ROUTINE W REFLEX MICROSCOPIC
Leukocytes, UA: NEGATIVE
Nitrite: NEGATIVE
Protein, ur: 100 mg/dL — AB
Urobilinogen, UA: 0.2 mg/dL (ref 0.0–1.0)

## 2011-06-11 LAB — URINE MICROSCOPIC-ADD ON

## 2011-06-11 LAB — COMPREHENSIVE METABOLIC PANEL
ALT: 15 U/L (ref 0–53)
Alkaline Phosphatase: 143 U/L — ABNORMAL HIGH (ref 39–117)
BUN: 54 mg/dL — ABNORMAL HIGH (ref 6–23)
CO2: 24 mEq/L (ref 19–32)
GFR calc Af Amer: 22 mL/min — ABNORMAL LOW (ref >90)
GFR calc non Af Amer: 19 mL/min — ABNORMAL LOW (ref >90)
Glucose, Bld: 152 mg/dL — ABNORMAL HIGH (ref 70–99)
Potassium: 5.7 mEq/L — ABNORMAL HIGH (ref 3.5–5.1)
Sodium: 137 mEq/L (ref 135–145)

## 2011-06-11 LAB — PROTIME-INR
INR: 1.13 (ref 0.00–1.49)
Prothrombin Time: 14.7 seconds (ref 11.6–15.2)

## 2011-06-11 NOTE — Progress Notes (Signed)
Fasting blood sugars run 150

## 2011-06-11 NOTE — Progress Notes (Signed)
Dr.[name] is cardiologist-last office visit in epic  Stress test done on 06/03/11 Echo in epic from 2007 Heart cath in epic from 05/28/11  Medical MD is Dr.TVang in [facility]

## 2011-06-11 NOTE — Pre-Procedure Instructions (Signed)
20 JETT FUKUDA  06/11/2011   Your procedure is scheduled on:  Mon, June 3 @ 9:10 AM  Report to [facility] at 7:00 AM.  Call this number if you have problems the morning of surgery: [phone]   Remember:   Do not eat food:After Midnight.  May have clear liquids: up to 4 Hours before arrival.(until 3:00 AM)  Clear liquids include soda, tea, black coffee, apple or grape juice, broth,water  Take these medicines the morning of surgery with A SIP OF WATER: Amlodipine(Norvasc) and Metoprolol(Lopressor)   Do not wear jewelry  Do not wear lotions, powders, or cologne  Men may shave face and neck.  Do not bring valuables to the hospital.  Contacts, dentures or bridgework may not be worn into surgery.  Leave suitcase in the car. After surgery it may be brought to your room.  For patients admitted to the hospital, checkout time is 11:00 AM the day of discharge.   Special Instructions: CHG Shower Use Special Wash: 1/2 bottle night before surgery and 1/2 bottle morning of surgery.   Please read over the following fact sheets that you were given: Pain Booklet, Coughing and Deep Breathing, Blood Transfusion Information, MRSA Information and Surgical Site Infection Prevention

## 2011-06-11 NOTE — Progress Notes (Signed)
06/10/2012 labs noted. Patient will need stat I-stat on arrival to check K level. CE

## 2011-06-13 MED ORDER — CEFUROXIME SODIUM 1.5 G IJ SOLR
1.5000 g | INTRAMUSCULAR | Status: AC
  Administered 2011-06-14: 1.5 g via INTRAVENOUS
  Filled 2011-06-13: qty 1.5

## 2011-06-14 ENCOUNTER — Encounter (HOSPITAL_COMMUNITY)
Admission: RE | Disposition: A | Discharge status: Rehabilitation Facility | Payer: Self-pay | Source: Ambulatory Visit | Attending: Vascular Surgery

## 2011-06-14 ENCOUNTER — Inpatient Hospital Stay (HOSPITAL_COMMUNITY)
Admission: RE | Admit: 2011-06-14 | Discharge: 2011-06-21 | DRG: 854 | Disposition: A | Discharge status: Rehabilitation Facility | Payer: Medicare Other | Source: Ambulatory Visit | Attending: Vascular Surgery | Admitting: Vascular Surgery

## 2011-06-14 ENCOUNTER — Encounter (HOSPITAL_COMMUNITY): Payer: Self-pay | Admitting: Anesthesiology

## 2011-06-14 ENCOUNTER — Ambulatory Visit (HOSPITAL_COMMUNITY): Payer: Medicare Other | Admitting: Anesthesiology

## 2011-06-14 DIAGNOSIS — Z7982 Long term (current) use of aspirin: Secondary | ICD-10-CM

## 2011-06-14 DIAGNOSIS — Z87442 Personal history of urinary calculi: Secondary | ICD-10-CM

## 2011-06-14 DIAGNOSIS — E1142 Type 2 diabetes mellitus with diabetic polyneuropathy: Secondary | ICD-10-CM | POA: Diagnosis present

## 2011-06-14 DIAGNOSIS — J984 Other disorders of lung: Secondary | ICD-10-CM | POA: Diagnosis not present

## 2011-06-14 DIAGNOSIS — Z794 Long term (current) use of insulin: Secondary | ICD-10-CM

## 2011-06-14 DIAGNOSIS — A419 Sepsis, unspecified organism: Secondary | ICD-10-CM | POA: Diagnosis not present

## 2011-06-14 DIAGNOSIS — I252 Old myocardial infarction: Secondary | ICD-10-CM | POA: Diagnosis not present

## 2011-06-14 DIAGNOSIS — E1149 Type 2 diabetes mellitus with other diabetic neurological complication: Secondary | ICD-10-CM | POA: Diagnosis present

## 2011-06-14 DIAGNOSIS — Z01812 Encounter for preprocedural laboratory examination: Secondary | ICD-10-CM

## 2011-06-14 DIAGNOSIS — Z860100 Personal history of colon polyps, unspecified: Secondary | ICD-10-CM

## 2011-06-14 DIAGNOSIS — I824Y9 Acute embolism and thrombosis of unspecified deep veins of unspecified proximal lower extremity: Secondary | ICD-10-CM | POA: Diagnosis not present

## 2011-06-14 DIAGNOSIS — I2510 Atherosclerotic heart disease of native coronary artery without angina pectoris: Secondary | ICD-10-CM | POA: Diagnosis present

## 2011-06-14 DIAGNOSIS — L03119 Cellulitis of unspecified part of limb: Secondary | ICD-10-CM | POA: Diagnosis present

## 2011-06-14 DIAGNOSIS — L98499 Non-pressure chronic ulcer of skin of other sites with unspecified severity: Secondary | ICD-10-CM | POA: Diagnosis not present

## 2011-06-14 DIAGNOSIS — Z79899 Other long term (current) drug therapy: Secondary | ICD-10-CM

## 2011-06-14 DIAGNOSIS — Z87891 Personal history of nicotine dependence: Secondary | ICD-10-CM

## 2011-06-14 DIAGNOSIS — E785 Hyperlipidemia, unspecified: Secondary | ICD-10-CM | POA: Diagnosis present

## 2011-06-14 DIAGNOSIS — Z8546 Personal history of malignant neoplasm of prostate: Secondary | ICD-10-CM | POA: Diagnosis not present

## 2011-06-14 DIAGNOSIS — I70269 Atherosclerosis of native arteries of extremities with gangrene, unspecified extremity: Secondary | ICD-10-CM | POA: Diagnosis present

## 2011-06-14 DIAGNOSIS — L089 Local infection of the skin and subcutaneous tissue, unspecified: Secondary | ICD-10-CM | POA: Diagnosis not present

## 2011-06-14 DIAGNOSIS — K5900 Constipation, unspecified: Secondary | ICD-10-CM | POA: Diagnosis present

## 2011-06-14 DIAGNOSIS — G609 Hereditary and idiopathic neuropathy, unspecified: Secondary | ICD-10-CM | POA: Diagnosis not present

## 2011-06-14 DIAGNOSIS — I129 Hypertensive chronic kidney disease with stage 1 through stage 4 chronic kidney disease, or unspecified chronic kidney disease: Secondary | ICD-10-CM | POA: Diagnosis present

## 2011-06-14 DIAGNOSIS — C61 Malignant neoplasm of prostate: Secondary | ICD-10-CM | POA: Diagnosis not present

## 2011-06-14 DIAGNOSIS — E1159 Type 2 diabetes mellitus with other circulatory complications: Secondary | ICD-10-CM | POA: Diagnosis present

## 2011-06-14 DIAGNOSIS — J9819 Other pulmonary collapse: Secondary | ICD-10-CM | POA: Diagnosis not present

## 2011-06-14 DIAGNOSIS — L02619 Cutaneous abscess of unspecified foot: Secondary | ICD-10-CM | POA: Diagnosis present

## 2011-06-14 DIAGNOSIS — N183 Chronic kidney disease, stage 3 unspecified: Secondary | ICD-10-CM | POA: Diagnosis present

## 2011-06-14 DIAGNOSIS — R9389 Abnormal findings on diagnostic imaging of other specified body structures: Secondary | ICD-10-CM | POA: Diagnosis not present

## 2011-06-14 DIAGNOSIS — Z5189 Encounter for other specified aftercare: Secondary | ICD-10-CM | POA: Diagnosis not present

## 2011-06-14 DIAGNOSIS — I96 Gangrene, not elsewhere classified: Secondary | ICD-10-CM | POA: Diagnosis not present

## 2011-06-14 DIAGNOSIS — M869 Osteomyelitis, unspecified: Secondary | ICD-10-CM | POA: Diagnosis not present

## 2011-06-14 DIAGNOSIS — R509 Fever, unspecified: Secondary | ICD-10-CM | POA: Diagnosis not present

## 2011-06-14 DIAGNOSIS — D62 Acute posthemorrhagic anemia: Secondary | ICD-10-CM | POA: Diagnosis not present

## 2011-06-14 DIAGNOSIS — S88119A Complete traumatic amputation at level between knee and ankle, unspecified lower leg, initial encounter: Secondary | ICD-10-CM | POA: Diagnosis not present

## 2011-06-14 DIAGNOSIS — Z951 Presence of aortocoronary bypass graft: Secondary | ICD-10-CM | POA: Diagnosis not present

## 2011-06-14 DIAGNOSIS — L97909 Non-pressure chronic ulcer of unspecified part of unspecified lower leg with unspecified severity: Secondary | ICD-10-CM | POA: Diagnosis not present

## 2011-06-14 DIAGNOSIS — S78119A Complete traumatic amputation at level between unspecified hip and knee, initial encounter: Secondary | ICD-10-CM | POA: Diagnosis not present

## 2011-06-14 DIAGNOSIS — I739 Peripheral vascular disease, unspecified: Secondary | ICD-10-CM | POA: Diagnosis not present

## 2011-06-14 HISTORY — PX: AMPUTATION: SHX166

## 2011-06-14 LAB — GLUCOSE, CAPILLARY
Glucose-Capillary: 129 mg/dL — ABNORMAL HIGH (ref 70–99)
Glucose-Capillary: 155 mg/dL — ABNORMAL HIGH (ref 70–99)

## 2011-06-14 LAB — POCT I-STAT 4, (NA,K, GLUC, HGB,HCT)
Hemoglobin: 10.5 g/dL — ABNORMAL LOW (ref 13.0–17.0)
Sodium: 140 mEq/L (ref 135–145)

## 2011-06-14 LAB — CREATININE, SERUM
Creatinine, Ser: 2.4 mg/dL — ABNORMAL HIGH (ref 0.50–1.35)
GFR calc non Af Amer: 26 mL/min — ABNORMAL LOW (ref >90)

## 2011-06-14 LAB — CBC
Hemoglobin: 10 g/dL — ABNORMAL LOW (ref 13.0–17.0)
MCHC: 31.8 g/dL (ref 30.0–36.0)
Platelets: 353 10*3/uL (ref 150–400)

## 2011-06-14 SURGERY — AMPUTATION DIGIT
Anesthesia: General | Site: Toe | Laterality: Left | Wound class: Dirty or Infected

## 2011-06-14 MED ORDER — GUAIFENESIN-DM 100-10 MG/5ML PO SYRP
15.0000 mL | ORAL_SOLUTION | ORAL | Status: DC | PRN
  Filled 2011-06-14: qty 15

## 2011-06-14 MED ORDER — PHENOL 1.4 % MT LIQD
1.0000 | OROMUCOSAL | Status: DC | PRN
  Filled 2011-06-14: qty 177

## 2011-06-14 MED ORDER — PROPOFOL 10 MG/ML IV EMUL
INTRAVENOUS | Status: DC | PRN
  Administered 2011-06-14: 50 mg via INTRAVENOUS
  Administered 2011-06-14: 200 mg via INTRAVENOUS

## 2011-06-14 MED ORDER — ASPIRIN EC 81 MG PO TBEC
81.0000 mg | DELAYED_RELEASE_TABLET | Freq: Every morning | ORAL | Status: DC
  Administered 2011-06-14 – 2011-06-21 (×7): 81 mg via ORAL
  Filled 2011-06-14 (×8): qty 1

## 2011-06-14 MED ORDER — METOPROLOL TARTRATE 1 MG/ML IV SOLN
2.0000 mg | INTRAVENOUS | Status: DC | PRN
  Administered 2011-06-16: 2.5 mg via INTRAVENOUS
  Filled 2011-06-14 (×2): qty 5

## 2011-06-14 MED ORDER — INSULIN ASPART 100 UNIT/ML ~~LOC~~ SOLN: 2.0000 [IU] | Freq: Every day | SUBCUTANEOUS | Status: DC

## 2011-06-14 MED ORDER — TEMAZEPAM 15 MG PO CAPS: 15.0000 mg | ORAL_CAPSULE | Freq: Every evening | ORAL | Status: DC | PRN

## 2011-06-14 MED ORDER — PREGABALIN 50 MG PO CAPS
75.0000 mg | ORAL_CAPSULE | Freq: Two times a day (BID) | ORAL | Status: DC
  Administered 2011-06-14 – 2011-06-21 (×13): 75 mg via ORAL
  Filled 2011-06-14 (×7): qty 1
  Filled 2011-06-14: qty 2
  Filled 2011-06-14 (×6): qty 1

## 2011-06-14 MED ORDER — LABETALOL HCL 5 MG/ML IV SOLN
10.0000 mg | INTRAVENOUS | Status: DC | PRN
  Filled 2011-06-14 (×2): qty 4

## 2011-06-14 MED ORDER — HYDRALAZINE HCL 20 MG/ML IJ SOLN
10.0000 mg | INTRAMUSCULAR | Status: DC | PRN
  Administered 2011-06-16 – 2011-06-21 (×4): 10 mg via INTRAVENOUS
  Filled 2011-06-14 (×5): qty 0.5

## 2011-06-14 MED ORDER — PIPERACILLIN-TAZOBACTAM 3.375 G IVPB
3.3750 g | Freq: Three times a day (TID) | INTRAVENOUS | Status: DC
  Administered 2011-06-14 – 2011-06-21 (×20): 3.375 g via INTRAVENOUS
  Filled 2011-06-14 (×23): qty 50

## 2011-06-14 MED ORDER — ACETAMINOPHEN 650 MG RE SUPP: 325.0000 mg | RECTAL | Status: DC | PRN

## 2011-06-14 MED ORDER — HYDROMORPHONE HCL PF 1 MG/ML IJ SOLN
0.2500 mg | INTRAMUSCULAR | Status: DC | PRN
  Administered 2011-06-14: 0.5 mg via INTRAVENOUS

## 2011-06-14 MED ORDER — VANCOMYCIN HCL IN DEXTROSE 1-5 GM/200ML-% IV SOLN
1000.0000 mg | INTRAVENOUS | Status: DC
  Administered 2011-06-15 – 2011-06-19 (×4): 1000 mg via INTRAVENOUS
  Filled 2011-06-14 (×6): qty 200

## 2011-06-14 MED ORDER — INSULIN DETEMIR 100 UNIT/ML ~~LOC~~ SOLN
26.0000 [IU] | Freq: Two times a day (BID) | SUBCUTANEOUS | Status: DC
  Administered 2011-06-14 – 2011-06-21 (×13): 26 [IU] via SUBCUTANEOUS
  Filled 2011-06-14 (×2): qty 10

## 2011-06-14 MED ORDER — ONDANSETRON HCL 4 MG/2ML IJ SOLN
INTRAMUSCULAR | Status: DC | PRN
  Administered 2011-06-14: 4 mg via INTRAVENOUS

## 2011-06-14 MED ORDER — OXYCODONE HCL 5 MG PO TABS
5.0000 mg | ORAL_TABLET | ORAL | Status: DC | PRN
  Administered 2011-06-15 – 2011-06-21 (×16): 10 mg via ORAL
  Filled 2011-06-14 (×16): qty 2

## 2011-06-14 MED ORDER — DOCUSATE SODIUM 100 MG PO CAPS
100.0000 mg | ORAL_CAPSULE | Freq: Every day | ORAL | Status: DC
  Administered 2011-06-15 – 2011-06-21 (×6): 100 mg via ORAL
  Filled 2011-06-14 (×7): qty 1

## 2011-06-14 MED ORDER — METOPROLOL TARTRATE 50 MG PO TABS
50.0000 mg | ORAL_TABLET | Freq: Two times a day (BID) | ORAL | Status: DC
  Administered 2011-06-14 – 2011-06-15 (×2): 50 mg via ORAL
  Filled 2011-06-14 (×3): qty 1

## 2011-06-14 MED ORDER — ZOLPIDEM TARTRATE 5 MG PO TABS: 5.0000 mg | ORAL_TABLET | Freq: Every evening | ORAL | Status: DC | PRN

## 2011-06-14 MED ORDER — MUPIROCIN 2 % EX OINT
1.0000 "application " | TOPICAL_OINTMENT | Freq: Two times a day (BID) | CUTANEOUS | Status: AC
  Administered 2011-06-14 – 2011-06-18 (×9): 1 via NASAL
  Filled 2011-06-14 (×2): qty 22

## 2011-06-14 MED ORDER — LIDOCAINE HCL (CARDIAC) 20 MG/ML IV SOLN
INTRAVENOUS | Status: DC | PRN
  Administered 2011-06-14: 80 mg via INTRAVENOUS

## 2011-06-14 MED ORDER — HEPARIN SODIUM (PORCINE) 5000 UNIT/ML IJ SOLN
5000.0000 [IU] | Freq: Three times a day (TID) | INTRAMUSCULAR | Status: DC
  Administered 2011-06-14 – 2011-06-19 (×14): 5000 [IU] via SUBCUTANEOUS
  Filled 2011-06-14 (×17): qty 1

## 2011-06-14 MED ORDER — MORPHINE SULFATE 2 MG/ML IJ SOLN
2.0000 mg | INTRAMUSCULAR | Status: DC | PRN
  Administered 2011-06-14 (×2): 2 mg via INTRAVENOUS
  Administered 2011-06-14: 4 mg via INTRAVENOUS
  Administered 2011-06-16: 2 mg via INTRAVENOUS
  Filled 2011-06-14: qty 2
  Filled 2011-06-14 (×5): qty 1
  Filled 2011-06-14 (×3): qty 2
  Filled 2011-06-14 (×2): qty 1

## 2011-06-14 MED ORDER — PIPERACILLIN-TAZOBACTAM 3.375 G IVPB: 3.3750 g | Freq: Four times a day (QID) | INTRAVENOUS | Status: DC

## 2011-06-14 MED ORDER — SODIUM CHLORIDE 0.9 % IV SOLN: INTRAVENOUS | Status: DC

## 2011-06-14 MED ORDER — VANCOMYCIN HCL 1000 MG IV SOLR
1500.0000 mg | INTRAVENOUS | Status: AC
  Administered 2011-06-14: 1500 mg via INTRAVENOUS
  Filled 2011-06-14: qty 1500

## 2011-06-14 MED ORDER — 0.9 % SODIUM CHLORIDE (POUR BTL) OPTIME
TOPICAL | Status: DC | PRN
  Administered 2011-06-14: 1000 mL

## 2011-06-14 MED ORDER — PANTOPRAZOLE SODIUM 40 MG PO TBEC
40.0000 mg | DELAYED_RELEASE_TABLET | Freq: Every day | ORAL | Status: DC
  Administered 2011-06-15 – 2011-06-21 (×5): 40 mg via ORAL
  Filled 2011-06-14 (×5): qty 1

## 2011-06-14 MED ORDER — POLYETHYLENE GLYCOL 3350 17 G PO PACK
17.0000 g | PACK | Freq: Every day | ORAL | Status: DC
  Administered 2011-06-14 – 2011-06-21 (×7): 17 g via ORAL
  Filled 2011-06-14 (×8): qty 1

## 2011-06-14 MED ORDER — AMLODIPINE BESYLATE 10 MG PO TABS
10.0000 mg | ORAL_TABLET | Freq: Every morning | ORAL | Status: DC
  Administered 2011-06-15 – 2011-06-21 (×6): 10 mg via ORAL
  Filled 2011-06-14 (×7): qty 1

## 2011-06-14 MED ORDER — FENTANYL CITRATE 0.05 MG/ML IJ SOLN
INTRAMUSCULAR | Status: DC | PRN
  Administered 2011-06-14: 25 ug via INTRAVENOUS

## 2011-06-14 MED ORDER — LACTATED RINGERS IV SOLN
INTRAVENOUS | Status: DC
  Administered 2011-06-14: 09:00:00 via INTRAVENOUS

## 2011-06-14 MED ORDER — ONDANSETRON HCL 4 MG/2ML IJ SOLN: 4.0000 mg | Freq: Four times a day (QID) | INTRAMUSCULAR | Status: DC | PRN

## 2011-06-14 MED ORDER — LACTATED RINGERS IV SOLN
INTRAVENOUS | Status: DC | PRN
  Administered 2011-06-14: 10:00:00 via INTRAVENOUS

## 2011-06-14 MED ORDER — ACETAMINOPHEN 325 MG PO TABS
325.0000 mg | ORAL_TABLET | ORAL | Status: DC | PRN
  Administered 2011-06-14 – 2011-06-16 (×4): 650 mg via ORAL
  Filled 2011-06-14 (×6): qty 2

## 2011-06-14 MED ORDER — SIMVASTATIN 5 MG PO TABS
5.0000 mg | ORAL_TABLET | Freq: Every day | ORAL | Status: DC
  Administered 2011-06-14 – 2011-06-20 (×7): 5 mg via ORAL
  Filled 2011-06-14 (×8): qty 1

## 2011-06-14 MED ORDER — ALUM & MAG HYDROXIDE-SIMETH 200-200-20 MG/5ML PO SUSP: 15.0000 mL | ORAL | Status: DC | PRN

## 2011-06-14 MED ORDER — CHLORHEXIDINE GLUCONATE CLOTH 2 % EX PADS
6.0000 | MEDICATED_PAD | Freq: Every day | CUTANEOUS | Status: AC
  Administered 2011-06-15 – 2011-06-19 (×5): 6 via TOPICAL

## 2011-06-14 SURGICAL SUPPLY — 66 items
ADH SKN CLS APL DERMABOND .7 (GAUZE/BANDAGES/DRESSINGS) ×2
BANDAGE ELASTIC 4 VELCRO ST LF (GAUZE/BANDAGES/DRESSINGS) ×3 IMPLANT
BANDAGE ESMARK 6X9 LF (GAUZE/BANDAGES/DRESSINGS) IMPLANT
BANDAGE GAUZE ELAST BULKY 4 IN (GAUZE/BANDAGES/DRESSINGS) ×3 IMPLANT
BNDG CMPR 9X6 STRL LF SNTH (GAUZE/BANDAGES/DRESSINGS)
BNDG ESMARK 6X9 LF (GAUZE/BANDAGES/DRESSINGS)
CANISTER SUCTION 2500CC (MISCELLANEOUS) ×3 IMPLANT
CLIP TI MEDIUM 24 (CLIP) ×3 IMPLANT
CLIP TI WIDE RED SMALL 24 (CLIP) ×3 IMPLANT
CLOTH BEACON ORANGE TIMEOUT ST (SAFETY) ×3 IMPLANT
CORONARY SUCKER SOFT TIP 10052 (MISCELLANEOUS) IMPLANT
COVER SURGICAL LIGHT HANDLE (MISCELLANEOUS) ×6 IMPLANT
CUFF TOURNIQUET SINGLE 24IN (TOURNIQUET CUFF) IMPLANT
CUFF TOURNIQUET SINGLE 34IN LL (TOURNIQUET CUFF) IMPLANT
CUFF TOURNIQUET SINGLE 44IN (TOURNIQUET CUFF) IMPLANT
DECANTER SPIKE VIAL GLASS SM (MISCELLANEOUS) IMPLANT
DERMABOND ADVANCED (GAUZE/BANDAGES/DRESSINGS) ×1
DERMABOND ADVANCED .7 DNX12 (GAUZE/BANDAGES/DRESSINGS) ×2 IMPLANT
DRAIN SNY WOU (WOUND CARE) IMPLANT
DRAPE EXTREMITY T 121X128X90 (DRAPE) ×3 IMPLANT
DRAPE WARM FLUID 44X44 (DRAPE) ×3 IMPLANT
DRAPE X-RAY CASS 24X20 (DRAPES) IMPLANT
DRSG COVADERM 4X10 (GAUZE/BANDAGES/DRESSINGS) IMPLANT
DRSG COVADERM 4X8 (GAUZE/BANDAGES/DRESSINGS) IMPLANT
DRSG EMULSION OIL 3X3 NADH (GAUZE/BANDAGES/DRESSINGS) ×3 IMPLANT
ELECT REM PT RETURN 9FT ADLT (ELECTROSURGICAL) ×3
ELECTRODE REM PT RTRN 9FT ADLT (ELECTROSURGICAL) ×2 IMPLANT
EVACUATOR SILICONE 100CC (DRAIN) IMPLANT
GLOVE BIO SURGEON STRL SZ7.5 (GLOVE) ×3 IMPLANT
GOWN PREVENTION PLUS XLARGE (GOWN DISPOSABLE) ×3 IMPLANT
GOWN STRL NON-REIN LRG LVL3 (GOWN DISPOSABLE) ×6 IMPLANT
KIT BASIN OR (CUSTOM PROCEDURE TRAY) ×3 IMPLANT
KIT ROOM TURNOVER OR (KITS) ×3 IMPLANT
MARKER GRAFT CORONARY BYPASS (MISCELLANEOUS) IMPLANT
NS IRRIG 1000ML POUR BTL (IV SOLUTION) ×6 IMPLANT
PACK GENERAL/GYN (CUSTOM PROCEDURE TRAY) ×3 IMPLANT
PACK PERIPHERAL VASCULAR (CUSTOM PROCEDURE TRAY) ×3 IMPLANT
PAD ARMBOARD 7.5X6 YLW CONV (MISCELLANEOUS) ×6 IMPLANT
PADDING CAST COTTON 6X4 STRL (CAST SUPPLIES) IMPLANT
SET COLLECT BLD 21X3/4 12 (NEEDLE) IMPLANT
SPECIMEN JAR SMALL (MISCELLANEOUS) ×3 IMPLANT
SPONGE GAUZE 4X4 12PLY (GAUZE/BANDAGES/DRESSINGS) ×3 IMPLANT
SPONGE SURGIFOAM ABS GEL 100 (HEMOSTASIS) IMPLANT
STAPLER VISISTAT 35W (STAPLE) IMPLANT
STOPCOCK 4 WAY LG BORE MALE ST (IV SETS) IMPLANT
SUT ETHILON 3 0 PS 1 (SUTURE) ×3 IMPLANT
SUT PROLENE 5 0 C 1 24 (SUTURE) ×3 IMPLANT
SUT PROLENE 6 0 CC (SUTURE) ×3 IMPLANT
SUT PROLENE 7 0 BV 1 (SUTURE) IMPLANT
SUT PROLENE 7 0 BV1 MDA (SUTURE) IMPLANT
SUT SILK 2 0 FS (SUTURE) ×3 IMPLANT
SUT SILK 2 0 SH (SUTURE) ×3 IMPLANT
SUT SILK 3 0 (SUTURE)
SUT SILK 3-0 18XBRD TIE 12 (SUTURE) IMPLANT
SUT VIC AB 2-0 CTX 36 (SUTURE) ×6 IMPLANT
SUT VIC AB 3-0 SH 27 (SUTURE) ×6
SUT VIC AB 3-0 SH 27X BRD (SUTURE) ×4 IMPLANT
SWAB COLLECTION DEVICE MRSA (MISCELLANEOUS) IMPLANT
TAPE UMBILICAL COTTON 1/8X30 (MISCELLANEOUS) IMPLANT
TOWEL OR 17X24 6PK STRL BLUE (TOWEL DISPOSABLE) ×6 IMPLANT
TOWEL OR 17X26 10 PK STRL BLUE (TOWEL DISPOSABLE) ×6 IMPLANT
TRAY FOLEY CATH 14FRSI W/METER (CATHETERS) ×3 IMPLANT
TUBE ANAEROBIC SPECIMEN COL (MISCELLANEOUS) IMPLANT
TUBING EXTENTION W/L.L. (IV SETS) IMPLANT
UNDERPAD 30X30 INCONTINENT (UNDERPADS AND DIAPERS) ×3 IMPLANT
WATER STERILE IRR 1000ML POUR (IV SOLUTION) ×3 IMPLANT

## 2011-06-14 NOTE — Progress Notes (Signed)
Wife states that pt c/o body achy last night and temp was 101 and wife gave 2 Tylenol

## 2011-06-14 NOTE — Anesthesia Preprocedure Evaluation (Signed)
Anesthesia Evaluation  Patient identified by MRN, date of birth, ID band Patient awake    Reviewed: Allergy & Precautions, NPO status , Patient's Chart, lab work & pertinent test results  Airway Mallampati: II      Dental   Pulmonary          Cardiovascular hypertension, Pt. on medications + CAD and + Past MI     Neuro/Psych    GI/Hepatic negative GI ROS, Neg liver ROS,   Endo/Other  Diabetes mellitus-, Type 2  Renal/GU negative Renal ROS     Musculoskeletal   Abdominal   Peds  Hematology negative hematology ROS (+)   Anesthesia Other Findings   Reproductive/Obstetrics                           Anesthesia Physical Anesthesia Plan  ASA: III  Anesthesia Plan: General   Post-op Pain Management:    Induction: Intravenous  Airway Management Planned: LMA  Additional Equipment:   Intra-op Plan:   Post-operative Plan: Extubation in OR  Informed Consent: I have reviewed the patients History and Physical, chart, labs and discussed the procedure including the risks, benefits and alternatives for the proposed anesthesia with the patient or authorized representative who has indicated his/her understanding and acceptance.     Plan Discussed with: CRNA  Anesthesia Plan Comments:         Anesthesia Quick Evaluation

## 2011-06-14 NOTE — Progress Notes (Signed)
Tel shows 18 bt run of SVT then return to sinus rhythm, pt resting in bed- Dr. Arbie Cookey made aware.

## 2011-06-14 NOTE — Interval H&P Note (Signed)
History and Physical Interval Note:  06/14/2011 7:48 AM  [name]  Pt noted to have fever of 102 this morning.  His foot looks similar to office visit last week, with erythema in the forefoot and dry gangrene of left fifth toe.  He denies cough or dysuria.  He has been on antiobiotics but foot pain is worse.  I believe best option for today is debridement of his foot and amputation of the toe for sepsis control with IV antibiotics.  Will delay bypass for now until sepsis controlled.  Discussed with patient and his wife that he is at high risk for limb loss and that this may be a leading to a below knee amputation rather than a limb salvage.  He has multiple significant comorbidities and if sepsis cannot be readily controlled BKA may be best option.  Will do foot debridement and 5th toe amp only today.  The patients' history has been reviewed, patient examined, no change in status, stable for surgery.  I have reviewed the patients' chart and labs.  Questions were answered to the patient's satisfaction.     Aaron Long

## 2011-06-14 NOTE — Progress Notes (Signed)
Spoke with Tiffany-vascular nurse;notified of temp 102.2--will get in touch with Dr.Fields and call me back

## 2011-06-14 NOTE — Transfer of Care (Signed)
Immediate Anesthesia Transfer of Care Note  Patient: Aaron Long  Procedure(s) Performed: Procedure(s) (LRB): AMPUTATION DIGIT (Left)  Patient Location: PACU  Anesthesia Type: General  Level of Consciousness: awake and alert   Airway & Oxygen Therapy: Patient Spontanous Breathing and Patient connected to nasal cannula oxygen  Post-op Assessment: Report given to PACU RN and Post -op Vital signs reviewed and stable  Post vital signs: Reviewed and stable  Complications: No apparent anesthesia complications

## 2011-06-14 NOTE — Progress Notes (Signed)
Family at bedside.  Pt. Now less agitated.  Mostly cooperative.  Oriented to person, situation, and occasionally place.  BP slightly elevated due to mild agitation.  Pt. Denies pain.

## 2011-06-14 NOTE — Op Note (Signed)
Procedure: Amputation of left fifth toe with debridement of left foot  Preoperative diagnosis: Gangrene left fifth toe  Postoperative diagnosis: Same  Anesthesia: Gen.  Assistant: Doreatha Massed PA-C  Indications: Patient is a 71 year old male who was originally scheduled today for a left femoral to posterior tibial bypass procedure. However he was noted in preoperative holding have a fever of 102. It was decided that point that we would only incise and drain his foot as well as amputate his toe due to the fact that he is currently septic.  Operative findings: Small abscess left foot with thrombosis of several digital vessels and tissue necrosis  Operative details: After obtaining informed consent, the patient was taken to the operating room. The patient was placed in supine position the operating room table. After induction of general anesthesia the patient's entire left lower extremity was prepped and draped in usual sterile fashion. A circumferential incision was made on the left foot at the base of the left fifth toe. The incision was carried down to the level of bone. A bone cutter was used to transect the phalanx. There was necrotic tissue in this location. Therefore longitudinal incision was made up the dorsal aspect of the foot over the metatarsal. There was cleaner tissue several centimeters up the metatarsal. The metatarsal shaft was dissected free in its midportion. This was then transected with a bone cutter. The bone was healthy in appearance in this location. The metatarsal was removed. This was all necrotic. I debrided this portion of his foot back to tissue that appeared reasonably healthy. However the digital vessels to the fourth toe are still compromised as well. The wound was thoroughly irrigated normal saline solution. There was minimal bleeding from the surrounding tissues. The wound was packed with a saline moistened gauze. The patient tolerated procedure well and there were no  complications. Instrument sponge and needle counts correct in the case. The patient was taken to recover in stable condition. Cultures were sent from the small abscess in the left foot.  Fabienne Bruns, MD [facility] Office: [phone] Pager: [phone]

## 2011-06-14 NOTE — Anesthesia Postprocedure Evaluation (Signed)
  Anesthesia Post-op Note  Patient: Aaron Long  Procedure(s) Performed: Procedure(s) (LRB): AMPUTATION DIGIT (Left)  Patient Location: PACU  Anesthesia Type: General  Level of Consciousness: awake  Airway and Oxygen Therapy: Patient Spontanous Breathing  Post-op Pain: mild  Post-op Assessment: Post-op Vital signs reviewed  Post-op Vital Signs: Reviewed  Complications: No apparent anesthesia complications

## 2011-06-14 NOTE — H&P (View-Only) (Signed)
[facility]  HISTORY AND PHYSICAL  History of Present Illness: Patient is a 71 y.o. year old male who presents for follow-up evaluation for PAD. He is on Aspirin for antiplatelet therapy. He was recently seen as an inpatient consult for dry gangrene of his left fifth toe. At that time he also had renal insufficiency and was evaluated by the nephrology service. It was decided to defer his arteriogram until his kidney function can improve somewhat. He returns today for further followup. He has no pain in the left foot. He has had no drainage from the left toe. There was cellulitis in his left foot of admission this is now resolved. His atherosclerotic risk factors remain diabetes, elevated cholesterol, smoking( remote 1993), and coronary artery disease. These are all currently stable and followed by the primary care physician. He is also being followed by Dr. Fox with nephrology. His recent arteriogram showed severe tibial disease but he did have a posterior tibial target.  Vein mapping showed reasonable vein at least to the knee.  He has had some increased smell and redness in the left foot over the past few days.  He recently saw Dr McIlhaney and had a negative stress test. Past Medical History   Diagnosis  Date   .  Diabetes mellitus    .  Coronary artery disease    .  Prostate cancer  2010    Past Surgical History   Procedure  Date   .  Cardiac surgery    .  Prostate surgery    .  Knee cartilage surgery      left knee   Review of Systems:  Neurologic: sensation in the feet is intact  Cardiac:denies shortness of breath or chest pain  Pulmonary: denies cough or wheeze  Social History  History   Substance Use Topics   .  Smoking status:  Former Smoker -- 40 years     Quit date:  04/28/1991   .  Smokeless tobacco:  Never Used   .  Alcohol Use:  Yes      occasional   Allergies  No Known Allergies  Current Outpatient Prescriptions   Medication  Sig  Dispense   Refill   .  amLODipine (NORVASC) 10 MG tablet  Take 1 tablet (10 mg total) by mouth daily.  30 tablet  0   .  aspirin EC 81 MG tablet  Take 81 mg by mouth daily.     .  insulin aspart (NOVOLOG) 100 UNIT/ML injection  Inject 10-12 Units into the skin See admin instructions. USES 10 UNITS IN THE MORNING 10 UNITS AT LUNCH AND 12 UNITS AT SUPPER     .  insulin detemir (LEVEMIR) 100 UNIT/ML injection  Inject 26 Units into the skin 2 (two) times daily.     .  metoprolol tartrate (LOPRESSOR) 25 MG tablet  Take 2 tablets (50 mg total) by mouth 2 (two) times daily.  60 tablet  0   .  pravastatin (PRAVACHOL) 80 MG tablet  Take 80 mg by mouth daily.     .  pregabalin (LYRICA) 75 MG capsule  Take 75 mg by mouth 2 (two) times daily.     Physical Examination  Filed Vitals:    05/20/11 1020  05/20/11 1041   BP:  178/72  180/72   Pulse:  71    Resp:  16    Height:  5' 8" (1.727 m)    Weight:  203 lb   1.6 oz (92.126 kg)    SpO2:  100%    Body mass index is 30.88 kg/(m^2).  General: Alert and oriented, no acute distress  HEENT: Normal  Neck: No bruit or JVD  Pulmonary: Clear to auscultation bilaterally  Cardiac: Regular Rate and Rhythm without murmur  Neurologic: Upper and lower extremity motor 5/5 and symmetric  Extremities: 2+ femoral absent popliteal and pedal pulses  Skin: no ulcer or rash, dry gangrene left 5th toe , with some left foot erythema laterally ASSESSMENT: Arterial occlusive disease with gangrene of left fifth toe, multiple additional comorbidities including renal dysfunction  PLAN: Left femoral to posterior tibial bypass with left 5 toe amputation Monday, June 3.  Pt was given a prescription for Levaquin and Percocet today. Risks benefits possible complications and procedure details were explained the patient and his wife today including but limited to worsening renal function, limb loss, bleeding, vessel injury. They understand and agree to proceed.  Lonnel Gjerde, MD  [facility]  Office: [phone]  Pager: [phone]   

## 2011-06-14 NOTE — Preoperative (Signed)
Beta Blockers   Reason not to administer Beta Blockers:Not Applicable, took metoprolol this am 

## 2011-06-14 NOTE — Progress Notes (Signed)
Pt confused and combative. Wife at bedside and states pt was confused preop.  Pt refused labs. If pt becomes less aggressive, will call lab to come back later.

## 2011-06-14 NOTE — Anesthesia Procedure Notes (Signed)
Procedure Name: LMA Insertion Date/Time: 06/14/2011 9:50 AM Performed by: Margaree Mackintosh Pre-anesthesia Checklist: Patient identified, Timeout performed, Emergency Drugs available, Suction available and Patient being monitored Patient Re-evaluated:Patient Re-evaluated prior to inductionOxygen Delivery Method: Circle system utilized Preoxygenation: Pre-oxygenation with 100% oxygen Intubation Type: IV induction Ventilation: Mask ventilation without difficulty and Oral airway inserted - appropriate to patient size LMA: LMA with gastric port inserted LMA Size: 4.0 Number of attempts: 2 Placement Confirmation: positive ETCO2 and breath sounds checked- equal and bilateral Tube secured with: Tape Dental Injury: Teeth and Oropharynx as per pre-operative assessment

## 2011-06-14 NOTE — Progress Notes (Signed)
ANTIBIOTIC CONSULT NOTE - INITIAL  Pharmacy Consult for Vancomycin Indication: Foot gangrene/necrosis; Sepsis s/p toe amputation  No Known Allergies  Patient Measurements:  Weight: 97kg  Vital Signs: Temp: 100.3 F (37.9 C) (06/03 1228) Temp src: Oral (06/03 1228) BP: 172/76 mmHg (06/03 1228) Pulse Rate: 81  (06/03 1228) Intake/Output from previous day:   Intake/Output from this shift: Total I/O In: 600 [I.V.:600] Out: 5 [Blood:5]  Labs:  Basename 06/14/11 1200  WBC 13.7*  HGB 10.0*  PLT 353  LABCREA --  CREATININE 2.40*   The CrCl is unknown because both a height and weight (above a minimum accepted value) are required for this calculation. No results found for this basename: VANCOTROUGH:2,VANCOPEAK:2,VANCORANDOM:2,GENTTROUGH:2,GENTPEAK:2,GENTRANDOM:2,TOBRATROUGH:2,TOBRAPEAK:2,TOBRARND:2,AMIKACINPEAK:2,AMIKACINTROU:2,AMIKACIN:2, in the last 72 hours   Microbiology: Recent Results (from the past 720 hour(s))  SURGICAL PCR SCREEN     Status: Abnormal   Collection Time   06/11/11  9:05 AM      Component Value Range Status Comment   MRSA, PCR POSITIVE (*) NEGATIVE  Final    Staphylococcus aureus POSITIVE (*) NEGATIVE  Final     Medical History: Past Medical History  Diagnosis Date  . Coronary artery disease   . Prostate cancer 2010  . PAD (peripheral artery disease)   . Gangrene of toe     dry  . Neuromuscular disorder     diabetic neuropathy  . Hypertension     takes Amlodipine daily and Metoprolol bid  . Hyperlipidemia     takes Pravastatin daily  . Myocardial infarction 2005  . Peripheral neuropathy   . Constipation     takes Miralax daily  . Hemorrhoids   . Hx of colonic polyps   . History of kidney stones   . Diabetes mellitus     Novolog and Levemir daily    Medications:  Prescriptions prior to admission  Medication Sig Dispense Refill  . amLODipine (NORVASC) 10 MG tablet Take 10 mg by mouth every morning.       [name] aspirin EC 81 MG tablet  Take 81 mg by mouth every morning.       . insulin aspart (NOVOLOG) 100 UNIT/ML injection Inject 2-4 Units into the skin daily. Per sliding scale      . insulin detemir (LEVEMIR) 100 UNIT/ML injection Inject 26 Units into the skin 2 (two) times daily.       . metoprolol tartrate (LOPRESSOR) 25 MG tablet Take 50 mg by mouth 2 (two) times daily.      . polyethylene glycol (MIRALAX / GLYCOLAX) packet Take 17 g by mouth daily.      . pravastatin (PRAVACHOL) 80 MG tablet Take 80 mg by mouth every morning.       . pregabalin (LYRICA) 75 MG capsule Take 75 mg by mouth 2 (two) times daily.       Assessment: 70yom s/p toe amputation/foot debridement to start Vancomycin for foot gangrene and possible sepsis.  - Tmax 102.2, WBC 13.7 - Wt 97kg - SCr 2.4, CrCl ~30 ml/min  Goal of Therapy:  Vancomycin trough level 15-20 mcg/ml  Plan:  1. Vancomycin 1.5g IV x 1, then 1g IV q24h 2. Monitor renal function, cultures and order levels when indicated  Cleon Dew [phone] 06/14/2011,12:53 PM

## 2011-06-15 ENCOUNTER — Encounter (HOSPITAL_COMMUNITY): Payer: Self-pay | Admitting: General Practice

## 2011-06-15 ENCOUNTER — Other Ambulatory Visit (HOSPITAL_COMMUNITY): Payer: Medicare Other

## 2011-06-15 LAB — CBC
HCT: 29.6 % — ABNORMAL LOW (ref 39.0–52.0)
HCT: 30.5 % — ABNORMAL LOW (ref 39.0–52.0)
Hemoglobin: 9.6 g/dL — ABNORMAL LOW (ref 13.0–17.0)
MCHC: 31.8 g/dL (ref 30.0–36.0)
MCV: 91 fL (ref 78.0–100.0)
RDW: 13.7 % (ref 11.5–15.5)
WBC: 14.3 10*3/uL — ABNORMAL HIGH (ref 4.0–10.5)

## 2011-06-15 LAB — GLUCOSE, CAPILLARY: Glucose-Capillary: 98 mg/dL (ref 70–99)

## 2011-06-15 LAB — BASIC METABOLIC PANEL
BUN: 32 mg/dL — ABNORMAL HIGH (ref 6–23)
BUN: 31 mg/dL — ABNORMAL HIGH (ref 6–23)
CO2: 24 mEq/L (ref 19–32)
Chloride: 102 mEq/L (ref 96–112)
Chloride: 100 mEq/L (ref 96–112)
Creatinine, Ser: 2.48 mg/dL — ABNORMAL HIGH (ref 0.50–1.35)
GFR calc Af Amer: 28 mL/min — ABNORMAL LOW (ref >90)
Potassium: 4.6 mEq/L (ref 3.5–5.1)

## 2011-06-15 LAB — POCT I-STAT 4, (NA,K, GLUC, HGB,HCT): Sodium: 140 mEq/L (ref 135–145)

## 2011-06-15 MED ORDER — METOPROLOL TARTRATE 25 MG/10 ML ORAL SUSPENSION
25.0000 mg | Freq: Two times a day (BID) | ORAL | Status: DC
  Filled 2011-06-15: qty 10

## 2011-06-15 MED ORDER — METOPROLOL TARTRATE 50 MG PO TABS
50.0000 mg | ORAL_TABLET | Freq: Once | ORAL | Status: AC
  Administered 2011-06-15: 50 mg via ORAL
  Filled 2011-06-15: qty 1

## 2011-06-15 NOTE — Progress Notes (Signed)
[facility]  Subjective  - Briefly confused this am according to family but now A and O x3  Objective 153/79 68 99 F (37.2 C) (Oral)  tmax 102 at 7 pm last night 19 94%  Intake/Output Summary (Last 24 hours) at 06/15/11 0815 Last data filed at 06/15/11 0503  Gross per 24 hour  Intake   1970 ml  Output    655 ml  Net   1315 ml   Left foot wound clean at this point, still some dead tissue at the margins  Assessment/Planning: Leukocytosis stable probably secondary to foot afebrile over last 8 hours  Chronic renal dysfunction stable  Left femoral posterior tibial bypass tomorrow as well as amputation of 4th toe and further debridement of foot as long as he remains without fever spikes.  Lengthy discussion with pt and family today that chances of limb salvage are probably 50:50 and that he may have worsening renal function or have other complications due to the size of the operation.  At this point the patient has opted for a bypass knowing the risks and limitations.  Tanairy Payeur E 06/15/2011 8:15 AM --  Laboratory Lab Results:  Basename 06/15/11 0535 06/14/11 1200  WBC 14.3* 13.7*  HGB 9.6* 10.0*  HCT 29.6* 31.4*  PLT 350 353   BMET  Basename 06/15/11 0535 06/14/11 1200 06/14/11 0703  NA 137 -- 140  K 4.6 -- 5.0  CL 102 -- --  CO2 24 -- --  GLUCOSE 106* -- 161*  BUN 32* -- --  CREATININE 2.50* 2.40* --  CALCIUM 9.2 -- --    COAG Lab Results  Component Value Date   INR 1.13 06/11/2011   INR 1.03 05/27/2011   No results found for this basename: PTT    Antibiotics Anti-infectives     Start     Dose/Rate Route Frequency Ordered Stop   06/15/11 1400   vancomycin (VANCOCIN) IVPB 1000 mg/200 mL premix        1,000 mg 200 mL/hr over 60 Minutes Intravenous Every 24 hours 06/14/11 1302     06/14/11 1400  piperacillin-tazobactam (ZOSYN) IVPB 3.375 g       3.375 g 12.5 mL/hr over 240 Minutes Intravenous 3 times per day 06/14/11  1304     06/14/11 1330   vancomycin (VANCOCIN) 1,500 mg in sodium chloride 0.9 % 500 mL IVPB        1,500 mg 250 mL/hr over 120 Minutes Intravenous NOW 06/14/11 1302 06/14/11 1609   06/14/11 1230   piperacillin-tazobactam (ZOSYN) IVPB 3.375 g  Status:  Discontinued        3.375 g 12.5 mL/hr over 240 Minutes Intravenous Every 6 hours 06/14/11 1225 06/14/11 1304   06/13/11 1448   cefUROXime (ZINACEF) 1.5 g in dextrose 5 % 50 mL IVPB        1.5 g 100 mL/hr over 30 Minutes Intravenous 30 min pre-op 06/13/11 1448 06/14/11 0947

## 2011-06-15 NOTE — Care Management Note (Signed)
    Page 1 of 1   06/21/2011     12:12:58 PM   CARE MANAGEMENT NOTE 06/21/2011  Patient:  Aaron Long, Aaron Long   Account Number:  [id number]  Date Initiated:  06/15/2011  Documentation initiated by:  SIMMONS,CRYSTAL  Subjective/Objective Assessment:   ADMITTED WITH PVD/GANGRENE; LIVES AT HOME WITH WIFE AND DAUGHTER. NEEDS ASSISTANCE WITH ADLS.     Action/Plan:   DISCHARGE PLANNING INITIATED.   Anticipated DC Date:  06/21/2011   Anticipated DC Plan:  IP REHAB FACILITY      DC Planning Services  CM consult      PAC Choice  IP REHAB   Choice offered to / List presented to:             Status of service:  Completed, signed off Medicare Important Message given?   (If response is "NO", the following Medicare IM given date fields will be blank) Date Medicare IM given:   Date Additional Medicare IM given:    Discharge Disposition:  IP REHAB FACILITY  Per UR Regulation:  Reviewed for med. necessity/level of care/duration of stay  If discussed at Long Length of Stay Meetings, dates discussed:    Comments:  06-21-11 12:10am Avie Arenas, RNBSN [phone] Plan for discharge to CIR today.  Rehab has bed, can take heparin. Need DC order and summary.  Informed Regina with [facility]. Ordered IS for bedside.  06/18/11  1438  CRYSTAL SIMMONS RN, BSN [phone] UR UPDATED; AWAITING CIR ADMISSION ON MONDAY 06/21/11. NCM WILL FOLLOW.   06/15/11  1429  CRYSTAL SIMMONS RN, BSN [phone] NCM WILL FOLLOW.

## 2011-06-16 ENCOUNTER — Inpatient Hospital Stay (HOSPITAL_COMMUNITY): Payer: Medicare Other | Admitting: Anesthesiology

## 2011-06-16 ENCOUNTER — Encounter (HOSPITAL_COMMUNITY): Payer: Self-pay | Admitting: Anesthesiology

## 2011-06-16 ENCOUNTER — Telehealth: Payer: Self-pay | Admitting: Vascular Surgery

## 2011-06-16 ENCOUNTER — Encounter (HOSPITAL_COMMUNITY)
Admission: RE | Disposition: A | Discharge status: Rehabilitation Facility | Payer: Self-pay | Source: Ambulatory Visit | Attending: Vascular Surgery

## 2011-06-16 DIAGNOSIS — I70269 Atherosclerosis of native arteries of extremities with gangrene, unspecified extremity: Secondary | ICD-10-CM

## 2011-06-16 HISTORY — PX: AMPUTATION: SHX166

## 2011-06-16 LAB — BASIC METABOLIC PANEL
CO2: 21 mEq/L (ref 19–32)
Chloride: 100 mEq/L (ref 96–112)
Creatinine, Ser: 2.71 mg/dL — ABNORMAL HIGH (ref 0.50–1.35)

## 2011-06-16 LAB — CBC
MCH: 29.7 pg (ref 26.0–34.0)
MCHC: 32.5 g/dL (ref 30.0–36.0)
MCV: 91.2 fL (ref 78.0–100.0)
Platelets: 307 10*3/uL (ref 150–400)
RDW: 13.9 % (ref 11.5–15.5)

## 2011-06-16 LAB — GLUCOSE, CAPILLARY
Glucose-Capillary: 165 mg/dL — ABNORMAL HIGH (ref 70–99)
Glucose-Capillary: 116 mg/dL — ABNORMAL HIGH (ref 70–99)
Glucose-Capillary: 97 mg/dL (ref 70–99)
Glucose-Capillary: 102 mg/dL — ABNORMAL HIGH (ref 70–99)

## 2011-06-16 SURGERY — AMPUTATION BELOW KNEE
Anesthesia: General | Site: Leg Lower | Laterality: Left | Wound class: Clean

## 2011-06-16 MED ORDER — EPHEDRINE SULFATE 50 MG/ML IJ SOLN
INTRAMUSCULAR | Status: DC | PRN
  Administered 2011-06-16: 10 mg via INTRAVENOUS

## 2011-06-16 MED ORDER — PHENYLEPHRINE HCL 10 MG/ML IJ SOLN
INTRAMUSCULAR | Status: DC | PRN
  Administered 2011-06-16: 40 ug via INTRAVENOUS

## 2011-06-16 MED ORDER — GUAIFENESIN-DM 100-10 MG/5ML PO SYRP: 15.0000 mL | ORAL_SOLUTION | ORAL | Status: DC | PRN

## 2011-06-16 MED ORDER — 0.9 % SODIUM CHLORIDE (POUR BTL) OPTIME
TOPICAL | Status: DC | PRN
  Administered 2011-06-16: 1000 mL

## 2011-06-16 MED ORDER — LIDOCAINE HCL (CARDIAC) 20 MG/ML IV SOLN
INTRAVENOUS | Status: DC | PRN
  Administered 2011-06-16: 100 mg via INTRAVENOUS

## 2011-06-16 MED ORDER — METOPROLOL TARTRATE 1 MG/ML IV SOLN: 2.0000 mg | INTRAVENOUS | Status: DC | PRN

## 2011-06-16 MED ORDER — ONDANSETRON HCL 4 MG/2ML IJ SOLN: 4.0000 mg | Freq: Four times a day (QID) | INTRAMUSCULAR | Status: DC | PRN

## 2011-06-16 MED ORDER — ACETAMINOPHEN 650 MG RE SUPP: 325.0000 mg | RECTAL | Status: DC | PRN

## 2011-06-16 MED ORDER — HYDRALAZINE HCL 20 MG/ML IJ SOLN: 10.0000 mg | INTRAMUSCULAR | Status: DC | PRN

## 2011-06-16 MED ORDER — ALUM & MAG HYDROXIDE-SIMETH 200-200-20 MG/5ML PO SUSP: 15.0000 mL | ORAL | Status: DC | PRN

## 2011-06-16 MED ORDER — DOCUSATE SODIUM 100 MG PO CAPS: 100.0000 mg | ORAL_CAPSULE | Freq: Every day | ORAL | Status: DC

## 2011-06-16 MED ORDER — SODIUM CHLORIDE 0.9 % IV SOLN
INTRAVENOUS | Status: DC | PRN
  Administered 2011-06-16 (×2): via INTRAVENOUS

## 2011-06-16 MED ORDER — ONDANSETRON HCL 4 MG/2ML IJ SOLN
INTRAMUSCULAR | Status: DC | PRN
  Administered 2011-06-16: 4 mg via INTRAVENOUS

## 2011-06-16 MED ORDER — TEMAZEPAM 15 MG PO CAPS: 15.0000 mg | ORAL_CAPSULE | Freq: Every evening | ORAL | Status: DC | PRN

## 2011-06-16 MED ORDER — MIDAZOLAM HCL 5 MG/5ML IJ SOLN
INTRAMUSCULAR | Status: DC | PRN
  Administered 2011-06-16: 2 mg via INTRAVENOUS

## 2011-06-16 MED ORDER — DROPERIDOL 2.5 MG/ML IJ SOLN: 0.6250 mg | INTRAMUSCULAR | Status: DC | PRN

## 2011-06-16 MED ORDER — POTASSIUM CHLORIDE CRYS ER 20 MEQ PO TBCR: 20.0000 meq | EXTENDED_RELEASE_TABLET | Freq: Once | ORAL | Status: AC | PRN

## 2011-06-16 MED ORDER — LABETALOL HCL 5 MG/ML IV SOLN: 10.0000 mg | INTRAVENOUS | Status: DC | PRN

## 2011-06-16 MED ORDER — PROPOFOL 10 MG/ML IV EMUL
INTRAVENOUS | Status: DC | PRN
  Administered 2011-06-16: 100 mg via INTRAVENOUS

## 2011-06-16 MED ORDER — PHENOL 1.4 % MT LIQD: 1.0000 | OROMUCOSAL | Status: DC | PRN

## 2011-06-16 MED ORDER — ACETAMINOPHEN 325 MG PO TABS
325.0000 mg | ORAL_TABLET | ORAL | Status: DC | PRN
  Administered 2011-06-17 – 2011-06-20 (×4): 650 mg via ORAL
  Filled 2011-06-16 (×2): qty 2

## 2011-06-16 MED ORDER — FENTANYL CITRATE 0.05 MG/ML IJ SOLN
INTRAMUSCULAR | Status: DC | PRN
  Administered 2011-06-16: 100 ug via INTRAVENOUS
  Administered 2011-06-16: 50 ug via INTRAVENOUS

## 2011-06-16 MED ORDER — PANTOPRAZOLE SODIUM 40 MG PO TBEC: 40.0000 mg | DELAYED_RELEASE_TABLET | Freq: Every day | ORAL | Status: DC

## 2011-06-16 MED ORDER — DEXTROSE 5 % IV SOLN
1.5000 g | Freq: Two times a day (BID) | INTRAVENOUS | Status: DC
  Filled 2011-06-16 (×2): qty 1.5

## 2011-06-16 MED ORDER — HYDROMORPHONE HCL PF 1 MG/ML IJ SOLN
0.2500 mg | INTRAMUSCULAR | Status: DC | PRN
  Administered 2011-06-16: 0.5 mg via INTRAVENOUS

## 2011-06-16 MED ORDER — MORPHINE SULFATE 2 MG/ML IJ SOLN
2.0000 mg | INTRAMUSCULAR | Status: DC | PRN
  Administered 2011-06-16 (×3): 4 mg via INTRAVENOUS
  Administered 2011-06-16: 2 mg via INTRAVENOUS
  Administered 2011-06-17: 4 mg via INTRAVENOUS
  Administered 2011-06-21: 2 mg via INTRAVENOUS

## 2011-06-16 MED ORDER — POTASSIUM CHLORIDE IN NACL 20-0.9 MEQ/L-% IV SOLN
INTRAVENOUS | Status: DC
  Administered 2011-06-16 – 2011-06-17 (×2): via INTRAVENOUS
  Filled 2011-06-16 (×6): qty 1000

## 2011-06-16 SURGICAL SUPPLY — 80 items
ADH SKN CLS APL DERMABOND .7 (GAUZE/BANDAGES/DRESSINGS)
BANDAGE ELASTIC 6 VELCRO ST LF (GAUZE/BANDAGES/DRESSINGS) ×2 IMPLANT
BANDAGE ESMARK 6X9 LF (GAUZE/BANDAGES/DRESSINGS) IMPLANT
BANDAGE GAUZE ELAST BULKY 4 IN (GAUZE/BANDAGES/DRESSINGS) ×4 IMPLANT
BLADE SAW RECIP 87.9 MT (BLADE) ×2 IMPLANT
BNDG CMPR 9X6 STRL LF SNTH (GAUZE/BANDAGES/DRESSINGS)
BNDG COHESIVE 6X5 TAN STRL LF (GAUZE/BANDAGES/DRESSINGS) ×2 IMPLANT
BNDG ESMARK 6X9 LF (GAUZE/BANDAGES/DRESSINGS)
CANISTER SUCTION 2500CC (MISCELLANEOUS) ×3 IMPLANT
CLIP TI MEDIUM 24 (CLIP) ×3 IMPLANT
CLIP TI WIDE RED SMALL 24 (CLIP) ×1 IMPLANT
CLOTH BEACON ORANGE TIMEOUT ST (SAFETY) ×3 IMPLANT
CORONARY SUCKER SOFT TIP 10052 (MISCELLANEOUS) IMPLANT
COVER SURGICAL LIGHT HANDLE (MISCELLANEOUS) ×6 IMPLANT
COVER TABLE BACK 60X90 (DRAPES) ×2 IMPLANT
CUFF TOURNIQUET SINGLE 24IN (TOURNIQUET CUFF) IMPLANT
CUFF TOURNIQUET SINGLE 34IN LL (TOURNIQUET CUFF) IMPLANT
CUFF TOURNIQUET SINGLE 44IN (TOURNIQUET CUFF) IMPLANT
DECANTER SPIKE VIAL GLASS SM (MISCELLANEOUS) IMPLANT
DERMABOND ADVANCED (GAUZE/BANDAGES/DRESSINGS)
DERMABOND ADVANCED .7 DNX12 (GAUZE/BANDAGES/DRESSINGS) ×1 IMPLANT
DRAIN SNY WOU (WOUND CARE) IMPLANT
DRAPE ORTHO SPLIT 77X108 STRL (DRAPES) ×6
DRAPE PROXIMA HALF (DRAPES) ×2 IMPLANT
DRAPE SURG ORHT 6 SPLT 77X108 (DRAPES) ×4 IMPLANT
DRAPE WARM FLUID 44X44 (DRAPE) ×1 IMPLANT
DRAPE X-RAY CASS 24X20 (DRAPES) IMPLANT
DRSG ADAPTIC 3X8 NADH LF (GAUZE/BANDAGES/DRESSINGS) ×2 IMPLANT
DRSG COVADERM 4X10 (GAUZE/BANDAGES/DRESSINGS) IMPLANT
DRSG COVADERM 4X8 (GAUZE/BANDAGES/DRESSINGS) IMPLANT
ELECT REM PT RETURN 9FT ADLT (ELECTROSURGICAL) ×3
ELECTRODE REM PT RTRN 9FT ADLT (ELECTROSURGICAL) ×2 IMPLANT
EVACUATOR SILICONE 100CC (DRAIN) IMPLANT
GLOVE BIO SURGEON STRL SZ7.5 (GLOVE) ×3 IMPLANT
GLOVE BIOGEL M 6.5 STRL (GLOVE) ×4 IMPLANT
GLOVE BIOGEL PI IND STRL 6.5 (GLOVE) ×2 IMPLANT
GLOVE BIOGEL PI IND STRL 7.5 (GLOVE) ×1 IMPLANT
GLOVE BIOGEL PI INDICATOR 6.5 (GLOVE) ×2
GLOVE BIOGEL PI INDICATOR 7.5 (GLOVE) ×1
GLOVE SURG SS PI 7.5 STRL IVOR (GLOVE) ×2 IMPLANT
GOWN PREVENTION PLUS XLARGE (GOWN DISPOSABLE) ×3 IMPLANT
GOWN STRL NON-REIN LRG LVL3 (GOWN DISPOSABLE) ×6 IMPLANT
KIT BASIN OR (CUSTOM PROCEDURE TRAY) ×3 IMPLANT
KIT ROOM TURNOVER OR (KITS) ×3 IMPLANT
MARKER GRAFT CORONARY BYPASS (MISCELLANEOUS) IMPLANT
NS IRRIG 1000ML POUR BTL (IV SOLUTION) ×4 IMPLANT
PACK GENERAL/GYN (CUSTOM PROCEDURE TRAY) ×2 IMPLANT
PACK PERIPHERAL VASCULAR (CUSTOM PROCEDURE TRAY) ×1 IMPLANT
PAD ARMBOARD 7.5X6 YLW CONV (MISCELLANEOUS) ×6 IMPLANT
PADDING CAST COTTON 6X4 STRL (CAST SUPPLIES) IMPLANT
SET COLLECT BLD 21X3/4 12 (NEEDLE) IMPLANT
SPONGE GAUZE 4X4 12PLY (GAUZE/BANDAGES/DRESSINGS) ×3 IMPLANT
SPONGE SURGIFOAM ABS GEL 100 (HEMOSTASIS) IMPLANT
STAPLER VISISTAT 35W (STAPLE) ×4 IMPLANT
STOCKINETTE IMPERVIOUS LG (DRAPES) ×2 IMPLANT
STOPCOCK 4 WAY LG BORE MALE ST (IV SETS) IMPLANT
SUT PROLENE 5 0 C 1 24 (SUTURE) IMPLANT
SUT PROLENE 6 0 CC (SUTURE) ×1 IMPLANT
SUT PROLENE 7 0 BV 1 (SUTURE) IMPLANT
SUT PROLENE 7 0 BV1 MDA (SUTURE) IMPLANT
SUT SILK 2 0 (SUTURE) ×3
SUT SILK 2 0 FS (SUTURE) ×1 IMPLANT
SUT SILK 2 0 SH (SUTURE) ×1 IMPLANT
SUT SILK 2 0 SH CR/8 (SUTURE) ×6 IMPLANT
SUT SILK 2-0 18XBRD TIE 12 (SUTURE) ×2 IMPLANT
SUT SILK 3 0 (SUTURE)
SUT SILK 3-0 18XBRD TIE 12 (SUTURE) IMPLANT
SUT VIC AB 2-0 CT1 27 (SUTURE) ×3
SUT VIC AB 2-0 CT1 TAPERPNT 27 (SUTURE) ×1 IMPLANT
SUT VIC AB 2-0 CTX 36 (SUTURE) IMPLANT
SUT VIC AB 2-0 SH 18 (SUTURE) ×6 IMPLANT
SUT VIC AB 3-0 SH 27 (SUTURE) ×3
SUT VIC AB 3-0 SH 27X BRD (SUTURE) ×3 IMPLANT
TAPE UMBILICAL COTTON 1/8X30 (MISCELLANEOUS) IMPLANT
TOWEL OR 17X24 6PK STRL BLUE (TOWEL DISPOSABLE) ×6 IMPLANT
TOWEL OR 17X26 10 PK STRL BLUE (TOWEL DISPOSABLE) ×6 IMPLANT
TRAY FOLEY CATH 14FRSI W/METER (CATHETERS) ×1 IMPLANT
TUBING EXTENTION W/L.L. (IV SETS) IMPLANT
UNDERPAD 30X30 INCONTINENT (UNDERPADS AND DIAPERS) ×3 IMPLANT
WATER STERILE IRR 1000ML POUR (IV SOLUTION) ×3 IMPLANT

## 2011-06-16 NOTE — Progress Notes (Signed)
Pts. Temp .this am was 101.4, and BP was high SBP was >180's pt. was  cIVomfortable denies any pain. Given tylenol 650mg  po  and lopressor 2.5mg . for BP IV. Dr. Darrick Penna made aware with pts. VS.

## 2011-06-16 NOTE — Anesthesia Postprocedure Evaluation (Signed)
Anesthesia Post Note  Patient: Aaron Long  Procedure(s) Performed: Procedure(s) (LRB): AMPUTATION BELOW KNEE (Left)  Anesthesia type: General  Patient location: PACU  Post pain: Pain level controlled and Adequate analgesia  Post assessment: Post-op Vital signs reviewed, Patient's Cardiovascular Status Stable, Respiratory Function Stable, Patent Airway and Pain level controlled  Last Vitals:  Filed Vitals:   06/16/11 1449  BP:   Pulse: 81  Temp:   Resp:     Post vital signs: Reviewed and stable  Level of consciousness: awake, alert  and oriented  Complications: No apparent anesthesia complications

## 2011-06-16 NOTE — Progress Notes (Signed)
Pt continues to spike fevers and have confusion.  At this point I believe best option is Left BKA.  Lengthy discussion with pt and family.  We will proceed with BKA later today.  Fabienne Bruns, MD [facility] [facility] Office: [phone] Pager: [phone]

## 2011-06-16 NOTE — Preoperative (Signed)
Beta Blockers   Reason not to administer Beta Blockers:Not Applicable. Lopressor 50mg  @ 2300 06/15/11

## 2011-06-16 NOTE — Op Note (Signed)
Procedure: Left below-knee amputation  Preoperative diagnosis: Gangrene left foot  Postoperative diagnosis: Same  Anesthesia Gen.  Assistant: Lianne Cure PA-C  Upper findings: #1 calcified vessels with reasonably well perfused muscle tissue  Operative details: After obtaining informed consent, the patient was taken to the operating room. The patient was placed in supine position on the operating table. After induction of general anesthesia and endotracheal intubation, the patient's entire left lower extremity was prepped and draped all the way down to the level of the ankle. Next a transverse incision was made approximately 4 fingerbreadths below the tibial tuberosity on the right leg.  The  incision was carried posteriorly to the midportion of the leg and then extended longitudinally to create a posterior flap. The subcutaneous tissues and fascia was taken down with cautery. Periosteum was raised on the tibia approximately 5 cm above the skin edge.  The periosteum was also raised on the fibula several centimeters above this. The tibia was then divided with a saw. The fibula was divided with a bone cutter. The leg was then elevated in the operative field and the amputation was completed posterior to the bones with an amputation knife. Hemostasis was then obtained with cautery and several suture ligatures. The tibial and sural nerves were pulled down into the field transected and allowed to retract up into the leg.  After hemostasis was obtained, the wound was thoroughly irrigated with  normal saline solution. The fascial edges were then reapproximated with interrupted 2-0 Vicryl sutures. Subcutaneous tissues were reapproximated using running 3-0 Vicryl suture. The skin was closed with staples. The patient tolerated the procedure well and there were no complications. Instrument sponge and  needle counts were correct at the end of the case. The patient was taken to the recovery room in stable  condition.  Fabienne Bruns, MD [facility] Office: [phone] Pager: [phone]

## 2011-06-16 NOTE — Anesthesia Preprocedure Evaluation (Addendum)
Anesthesia Evaluation  Patient identified by MRN, date of birth, ID band Patient awake    Reviewed: Allergy & Precautions, H&P , NPO status , Patient's Chart, lab work & pertinent test results, reviewed documented beta blocker date and time   History of Anesthesia Complications Negative for: history of anesthetic complications  Airway Mallampati: II TM Distance: >3 FB     Dental  (+) Edentulous Upper, Poor Dentition and Dental Advisory Given   Pulmonary neg pulmonary ROS,    Pulmonary exam normal       Cardiovascular hypertension, Pt. on medications and Pt. on home beta blockers + CAD and + Past MI     Neuro/Psych  Neuromuscular disease    GI/Hepatic negative GI ROS, Neg liver ROS,   Endo/Other  Diabetes mellitus-, Well Controlled, Type 1, Insulin Dependent  Renal/GU Renal InsufficiencyRenal disease     Musculoskeletal   Abdominal   Peds  Hematology   Anesthesia Other Findings   Reproductive/Obstetrics                         Anesthesia Physical Anesthesia Plan  ASA: III  Anesthesia Plan: General   Post-op Pain Management:    Induction: Intravenous  Airway Management Planned: LMA  Additional Equipment:   Intra-op Plan:   Post-operative Plan: Extubation in OR  Informed Consent: I have reviewed the patients History and Physical, chart, labs and discussed the procedure including the risks, benefits and alternatives for the proposed anesthesia with the patient or authorized representative who has indicated his/her understanding and acceptance.   Dental advisory given  Plan Discussed with: CRNA, Anesthesiologist and Surgeon  Anesthesia Plan Comments:         Anesthesia Quick Evaluation

## 2011-06-16 NOTE — Anesthesia Postprocedure Evaluation (Signed)
Anesthesia Post Note  Patient: Aaron Long  Procedure(s) Performed: Procedure(s) (LRB): AMPUTATION BELOW KNEE (Left)  Anesthesia type: general  Patient location: PACU  Post pain: Pain level controlled  Post assessment: Patient's Cardiovascular Status Stable  Last Vitals:  Filed Vitals:   06/16/11 1508  BP: 181/73  Pulse: 73  Temp: 37.2 C  Resp: 18    Post vital signs: Reviewed and stable  Level of consciousness: sedated  Complications: No apparent anesthesia complications

## 2011-06-16 NOTE — Telephone Encounter (Addendum)
Message copied by Rosalyn Charters on Wed Jun 16, 2011  2:44 PM ------      Message from: Fabienne Bruns E      Created: Wed Jun 16, 2011  1:50 PM       Left BKA on [place]      Asst Rhyne            Also on Myrtie Cruise      Right forearm AV graft, ligation right radial cephalic AVF, ligation left brachial cephalic AVF      She needs follow up in 2 weeks      Asst Rhyne            Charles Fields  UNABLE TO REACH PT. BY PHONE MAILED APPT. LETTER NOTIFYING PT. OF FU APPT. WITH [facility] ON 07-01-11 AT 1:45 PM

## 2011-06-16 NOTE — Transfer of Care (Signed)
Immediate Anesthesia Transfer of Care Note  Patient: Aaron Long  Procedure(s) Performed: Procedure(s) (LRB): AMPUTATION BELOW KNEE (Left)  Patient Location: PACU  Anesthesia Type: General  Level of Consciousness: sedated  Airway & Oxygen Therapy: Patient Spontanous Breathing and Patient connected to nasal cannula oxygen  Post-op Assessment: Report given to PACU RN and Post -op Vital signs reviewed and stable  Post vital signs: Reviewed and stable  Complications: No apparent anesthesia complications

## 2011-06-16 NOTE — Anesthesia Procedure Notes (Signed)
Procedure Name: LMA Insertion Date/Time: 06/16/2011 12:43 PM Performed by: Garen Lah Pre-anesthesia Checklist: Patient identified, Timeout performed, Emergency Drugs available, Suction available and Patient being monitored Patient Re-evaluated:Patient Re-evaluated prior to inductionOxygen Delivery Method: Circle system utilized Preoxygenation: Pre-oxygenation with 100% oxygen Intubation Type: IV induction Ventilation: Mask ventilation without difficulty LMA: LMA inserted LMA Size: 4.0 Number of attempts: 1 Placement Confirmation: positive ETCO2 and breath sounds checked- equal and bilateral Tube secured with: Tape Dental Injury: Teeth and Oropharynx as per pre-operative assessment

## 2011-06-17 DIAGNOSIS — L98499 Non-pressure chronic ulcer of skin of other sites with unspecified severity: Secondary | ICD-10-CM

## 2011-06-17 DIAGNOSIS — I739 Peripheral vascular disease, unspecified: Secondary | ICD-10-CM

## 2011-06-17 DIAGNOSIS — S88119A Complete traumatic amputation at level between knee and ankle, unspecified lower leg, initial encounter: Secondary | ICD-10-CM

## 2011-06-17 LAB — WOUND CULTURE

## 2011-06-17 LAB — BASIC METABOLIC PANEL
CO2: 21 mEq/L (ref 19–32)
Calcium: 8.9 mg/dL (ref 8.4–10.5)
Chloride: 102 mEq/L (ref 96–112)
GFR calc Af Amer: 28 mL/min — ABNORMAL LOW (ref >90)
Sodium: 135 mEq/L (ref 135–145)

## 2011-06-17 LAB — CBC
MCV: 92.3 fL (ref 78.0–100.0)
Platelets: 350 10*3/uL (ref 150–400)
RBC: 3.12 MIL/uL — ABNORMAL LOW (ref 4.22–5.81)
RDW: 13.9 % (ref 11.5–15.5)
WBC: 15.2 10*3/uL — ABNORMAL HIGH (ref 4.0–10.5)

## 2011-06-17 LAB — GLUCOSE, CAPILLARY: Glucose-Capillary: 156 mg/dL — ABNORMAL HIGH (ref 70–99)

## 2011-06-17 MED ORDER — INSULIN ASPART 100 UNIT/ML ~~LOC~~ SOLN
0.0000 [IU] | Freq: Three times a day (TID) | SUBCUTANEOUS | Status: DC
  Administered 2011-06-17: 11 [IU] via SUBCUTANEOUS
  Administered 2011-06-18 – 2011-06-19 (×2): 3 [IU] via SUBCUTANEOUS
  Administered 2011-06-19 – 2011-06-20 (×2): 7 [IU] via SUBCUTANEOUS
  Administered 2011-06-21 (×2): 4 [IU] via SUBCUTANEOUS

## 2011-06-17 MED ORDER — METAXALONE 800 MG PO TABS
800.0000 mg | ORAL_TABLET | Freq: Once | ORAL | Status: AC
  Administered 2011-06-17: 800 mg via ORAL
  Filled 2011-06-17: qty 1

## 2011-06-17 NOTE — Evaluation (Signed)
Occupational Therapy Evaluation Patient Details Name: Aaron Long MRN: [id number] DOB: March 09, 1940 Today's Date: 06/17/2011 Time: 4098-1191 OT Time Calculation (min): 27 min  OT Assessment / Plan / Recommendation Clinical Impression  Pt is a 71 yo male who presents with a L BKA. Skilled OT recommended to maximize I w/BADLs to min-mod A level in prep for d/c to next venue of care. Feel pt is appropriate for inpt rehab.    OT Assessment  Patient needs continued OT Services    Follow Up Recommendations  Inpatient Rehab    Barriers to Discharge Inaccessible home environment;Decreased caregiver support    Equipment Recommendations  Defer to next venue    Recommendations for Other Services Rehab consult  Frequency  Min 2X/week    Precautions / Restrictions Precautions Precautions: Fall Precaution Comments: L BKA   Pertinent Vitals/Pain     ADL  Grooming: Simulated;Min guard Where Assessed - Grooming: Supported sitting Upper Body Bathing: Simulated;Minimal assistance Where Assessed - Upper Body Bathing: Supported sitting Lower Body Bathing: Simulated;+2 Total assistance (Pt needs to keep B hands on RW when standing.) Lower Body Bathing: Patient Percentage: 0% Where Assessed - Lower Body Bathing: Supported sit to stand Upper Body Dressing: Simulated;Minimal assistance Where Assessed - Upper Body Dressing: Supported sitting Lower Body Dressing: Simulated;+2 Total assistance (Pt needs to keep B hands on RW when standing.) Lower Body Dressing: Patient Percentage: 0% Where Assessed - Lower Body Dressing: Sopported sit to stand Toilet Transfer: Simulated;+2 Total assistance Toilet Transfer: Patient Percentage: 60% Toilet Transfer Method: Stand pivot (max cues for weight shift, hand placement and LE position.) Toilet Transfer Equipment: Other (comment) (to recliner) Toileting - Clothing Manipulation and Hygiene: Simulated;+2 Total assistance (Pt needs to keep B hands on RW when  standing.) Toileting - Clothing Manipulation and Hygiene: Patient Percentage: 0% Where Assessed - Toileting Clothing Manipulation and Hygiene: Standing ADL Comments: Pt tolerated 1st tx session well. Does fatigue quickly.    OT Diagnosis: Generalized weakness  OT Problem List: Decreased activity tolerance;Decreased safety awareness;Decreased knowledge of use of DME or AE;Impaired balance (sitting and/or standing);Decreased knowledge of precautions OT Treatment Interventions: Self-care/ADL training;Therapeutic activities;DME and/or AE instruction;Patient/family education;Balance training   OT Goals Acute Rehab OT Goals OT Goal Formulation: With patient Time For Goal Achievement: 07/01/11 Potential to Achieve Goals: Good ADL Goals Pt Will Perform Grooming: with set-up;Sitting, edge of bed;Sitting, chair;Unsupported ADL Goal: Grooming - Progress: Goal set today Pt Will Perform Upper Body Bathing: with set-up;Sitting, edge of bed;Sitting, chair;Unsupported ADL Goal: Upper Body Bathing - Progress: Goal set today Pt Will Perform Lower Body Bathing: with mod assist;Sit to stand from chair;Sit to stand from bed ADL Goal: Lower Body Bathing - Progress: Goal set today Pt Will Perform Upper Body Dressing: with set-up;Sitting, chair;Sitting, bed;Unsupported ADL Goal: Upper Body Dressing - Progress: Goal set today Pt Will Perform Lower Body Dressing: with mod assist;Sit to stand from chair;Sit to stand from bed ADL Goal: Lower Body Dressing - Progress: Goal set today Pt Will Transfer to Toilet: with min assist;Stand pivot transfer;3-in-1;Regular height toilet ADL Goal: Toilet Transfer - Progress: Goal set today Pt Will Perform Toileting - Clothing Manipulation: with min assist;Sitting on 3-in-1 or toilet;Standing ADL Goal: Toileting - Clothing Manipulation - Progress: Goal set today Pt Will Perform Toileting - Hygiene: Sitting on 3-in-1 or toilet;Leaning right and/or left on 3-in-1/toilet;Sit to  stand from 3-in-1/toilet ADL Goal: Toileting - Hygiene - Progress: Goal set today Miscellaneous OT Goals Miscellaneous OT Goal #1: Pt demo good dynamic sit  balance unsupported EOB X 10 min in prep for seated ADL. OT Goal: Miscellaneous Goal #1 - Progress: Goal set today  Visit Information  Last OT Received On: 06/17/11 Assistance Needed: +2 PT/OT Co-Evaluation/Treatment: Yes    Subjective Data  Subjective: It feels so different without this leg Patient Stated Goal: Rehab sounds good.   Prior Functioning  Home Living Lives With: Spouse Available Help at Discharge: Family;Available PRN/intermittently Type of Home: Apartment Home Access: Level entry Home Layout: One level Bathroom Shower/Tub: Walk-in shower;Door [employer] Toilet: Standard Home Adaptive Equipment: Shower chair with back;Walker - standard;Straight cane;Bedside commode/3-in-1 Prior Function Level of Independence: Needs assistance Needs Assistance: Bathing;Dressing;Toileting;Meal Prep;Light Housekeeping Bath: Moderate Dressing: Moderate Toileting: Moderate Meal Prep: Total Light Housekeeping: Total Driving: No Vocation: Retired Comments: wife has been assisting the last 6 months Communication Communication: No difficulties    Cognition  Overall Cognitive Status: Impaired (Simultaneous filing. User may not have seen previous data.) Area of Impairment: Other (comment) (slow to process) Arousal/Alertness: Awake/alert (Simultaneous filing. User may not have seen previous data.) Orientation Level: Appears intact for tasks assessed (increased time to answer but appropriate Simultaneous filing. User may not have seen previous data.) Behavior During Session: [facility] for tasks performed (Simultaneous filing. User may not have seen previous data.) Cognition - Other Comments: Pt is occasionally slow to process multi step commands.    Extremity/Trunk Assessment Right Upper Extremity Assessment RUE ROM/Strength/Tone: Within  functional levels Left Upper Extremity Assessment LUE ROM/Strength/Tone: Within functional levels Right Lower Extremity Assessment RLE ROM/Strength/Tone: [facility] for tasks assessed Left Lower Extremity Assessment LLE ROM/Strength/Tone: Deficits LLE ROM/Strength/Tone Deficits: decreased quad activation with quad sets but able to activate with short arc quads, unable to perform isometric hip extension potentially more related to cognition than strength   Mobility Bed Mobility Bed Mobility: Rolling Left;Left Sidelying to Sit;Sitting - Scoot to Edge of Bed Rolling Left: 5: Supervision Left Sidelying to Sit: 4: Min assist;HOB flat;With rails Sitting - Scoot to Edge of Bed: 4: Min assist Details for Bed Mobility Assistance: cueing for sequence, use of rail and safety with mobility with increased time to complete Transfers Transfers: Sit to Stand;Stand to Sit Sit to Stand: 1: +2 Total assist;From bed;From chair/3-in-1 Sit to Stand: Patient Percentage: 60% Stand to Sit: 1: +2 Total assist;To chair/3-in-1;With armrests Stand to Sit: Patient Percentage: 70% Details for Transfer Assistance: Pt stood 1st trial from bed holding onto therapist arms, then second trial pushing off bed with cueing for sequence and 3rd trial from chair with armrests. Pt required assist for anterior translation and midline positioning due to tendency for left lean.  Use of RW for pivot to chair   Exercise   Balance Balance Balance Assessed: Yes Static Sitting Balance Static Sitting - Balance Support: Bilateral upper extremity supported;Feet supported (foot) Static Sitting - Level of Assistance: 5: Stand by assistance Static Sitting - Comment/# of Minutes: 3 Static Standing Balance Static Standing - Balance Support: Bilateral upper extremity supported Static Standing - Level of Assistance: 3: Mod assist Static Standing - Comment/# of Minutes: 3  End of Session OT - End of Session Equipment Utilized During Treatment: Gait  belt Activity Tolerance: Patient tolerated treatment well Patient left: in chair;with call bell/phone within reach   Samon Dishner A OTR/L [phone] 06/17/2011, 9:17 AM

## 2011-06-17 NOTE — Progress Notes (Signed)
Inpatient Diabetes Program Recommendations  AACE/ADA: New Consensus Statement on Inpatient Glycemic Control (2009)  Target Ranges:  Prepandial:   less than 140 mg/dL      Peak postprandial:   less than 180 mg/dL (1-2 hours)      Critically ill patients:  140 - 180 mg/dL   Reason for Visit: Consult for Hyperglycemia Patient is a 71 y.o. year old male who presents for follow-up evaluation for PAD. He is on Aspirin for antiplatelet therapy. He was recently seen as an inpatient consult for dry gangrene of his left fifth toe.  Gives himself insulin at home and regularly checks blood sugars 3 - 4 times/day.  Sees Dr. Lucianne Muss for diabetes management.  Has occasional hypoglycemia and treats with juice or soda.  Blood sugars usually range from 79 - 180.  Results for CONNELL, BOGNAR (MRN [id number]) as of 06/17/2011 16:30  Ref. Range 06/16/2011 05:50 06/17/2011 06:10  Glucose Latest Range: 70-99 mg/dL 629 (H) 528 (H)  Results for KRYSTOPHER, KUENZEL (MRN [id number]) as of 06/17/2011 16:30  Ref. Range 06/16/2011 14:08 06/16/2011 15:22 06/16/2011 21:54 06/17/2011 06:19 06/17/2011 09:56  Glucose-Capillary Latest Range: 70-99 mg/dL 97 272 (H) 536 (H) 644 (H) 156 (H)      Inpatient Diabetes Program Recommendations Insulin - Meal Coverage: Add meal coverage insulin - Novolog 3 units tidwc if pt eats > 50% meal Diet: Diet advanced to CHO-mod med  Note:  Will continue to follow while in hospital.

## 2011-06-17 NOTE — Progress Notes (Signed)
Pt with + MRSA culture from L foot. Dr Darrick Penna made aware will continue contact precautions and  to monitor patient. Morgane Joerger, [employer]

## 2011-06-17 NOTE — Evaluation (Signed)
Physical Therapy Evaluation Patient Details Name: Aaron Long MRN: [id number] DOB: [birth year] Today's Date: 06/17/2011 Time: 7829-5621 PT Time Calculation (min): 27 min Co eval with OT  PT Assessment / Plan / Recommendation Clinical Impression  Pt s/p left BKA with need for skilled therapy to maximize transfers, mobility, gait, balance and safety prior to discharge to next venue. Pt very pleasant but demonstrates decreased processing today.     PT Assessment  Patient needs continued PT services    Follow Up Recommendations  Inpatient Rehab    Barriers to Discharge None      lEquipment Recommendations  Defer to next venue    Recommendations for Other Services Rehab consult   Frequency Min 4X/week    Precautions / Restrictions Precautions Precautions: Fall Precaution Comments: L BKA   Pertinent Vitals/Pain No pain 91% on RA, HR 95      Mobility  Bed Mobility Bed Mobility: Rolling Left;Left Sidelying to Sit;Sitting - Scoot to Edge of Bed Rolling Left: 5: Supervision Left Sidelying to Sit: 4: Min assist;HOB flat;With rails Sitting - Scoot to Edge of Bed: 4: Min assist Details for Bed Mobility Assistance: cueing for sequence, use of rail and safety with mobility with increased time to complete Transfers Transfers: Sit to Stand;Stand to Sit;Stand Pivot Transfers Sit to Stand: 1: +2 Total assist;From bed;From chair/3-in-1 Sit to Stand: Patient Percentage: 60% Stand to Sit: 1: +2 Total assist;To chair/3-in-1;With armrests Stand to Sit: Patient Percentage: 70% Stand Pivot Transfers: 1: +2 Total assist Stand Pivot Transfers: Patient Percentage: 60% Details for Transfer Assistance: Pt stood 1st trial from bed holding onto therapist arms, then second trial pushing off bed with cueing for sequence and 3rd trial from chair with armrests. Pt required assist for anterior translation and midline positioning due to tendency for left lean.  Use of RW for pivot to  chair Ambulation/Gait Ambulation/Gait Assistance: 1: +2 Total assist Ambulation/Gait: Patient Percentage: 70% Ambulation Distance (Feet): 1 Feet Assistive device: Rolling walker Ambulation/Gait Assistance Details: cueing for sequence and very short hops to step forward with chair pulled behind pt Gait Pattern: Step-to pattern;Decreased step length - right Stairs: No    Exercises General Exercises - Lower Extremity Short Arc Quad: AROM;Left;5 reps;Seated Hip ABduction/ADduction: AROM;Left;5 reps;Seated   PT Diagnosis: Abnormality of gait;Difficulty walking  PT Problem List: Decreased activity tolerance;Decreased balance;Decreased mobility;Decreased coordination;Decreased cognition;Decreased knowledge of use of DME PT Treatment Interventions: Gait training;DME instruction;Functional mobility training;Therapeutic activities;Therapeutic exercise;Balance training;Patient/family education;Wheelchair mobility training;Cognitive remediation   PT Goals Acute Rehab PT Goals PT Goal Formulation: With patient Time For Goal Achievement: 06/24/11 Potential to Achieve Goals: Good Pt will go Supine/Side to Sit: with supervision PT Goal: Supine/Side to Sit - Progress: Goal set today Pt will go Sit to Supine/Side: with supervision PT Goal: Sit to Supine/Side - Progress: Goal set today Pt will go Sit to Stand: with min assist PT Goal: Sit to Stand - Progress: Goal set today Pt will go Stand to Sit: with min assist PT Goal: Stand to Sit - Progress: Goal set today Pt will Transfer Bed to Chair/Chair to Bed: with mod assist PT Transfer Goal: Bed to Chair/Chair to Bed - Progress: Goal set today Pt will Ambulate: 1 - 15 feet;with mod assist;with rolling walker PT Goal: Ambulate - Progress: Goal set today Pt will Perform Home Exercise Program: with supervision, verbal cues required/provided PT Goal: Perform Home Exercise Program - Progress: Goal set today  Visit Information  Last PT Received On:  06/17/11 Assistance Needed: +2  Subjective Data  Subjective: "I'm Aaron Long" Patient Stated Goal: be able to stand up   Prior Functioning  Home Living Lives With: Spouse Available Help at Discharge: Family;Available PRN/intermittently Type of Home: Apartment Home Access: Level entry Home Layout: One level Bathroom Shower/Tub: Walk-in shower;Door [employer] Toilet: Standard Home Adaptive Equipment: Shower chair with back;Walker - standard;Straight cane;Bedside commode/3-in-1 Prior Function Level of Independence: Needs assistance Needs Assistance: Bathing;Dressing;Toileting;Meal Prep;Light Housekeeping Bath: Moderate Dressing: Moderate Toileting: Moderate Meal Prep: Total Light Housekeeping: Total Driving: No Vocation: Retired Comments: wife has been assisting the last 6 months Communication Communication: No difficulties    Cognition  Overall Cognitive Status: Impaired (Simultaneous filing. User may not have seen previous data.) Area of Impairment: Other (comment) (slow to process) Arousal/Alertness: Awake/alert (Simultaneous filing. User may not have seen previous data.) Orientation Level: Appears intact for tasks assessed (increased time to answer but appropriate Simultaneous filing. User may not have seen previous data.) Behavior During Session: [facility] for tasks performed (Simultaneous filing. User may not have seen previous data.) Cognition - Other Comments: Pt is occasionally slow to process multi step commands.    Extremity/Trunk Assessment Right Upper Extremity Assessment RUE ROM/Strength/Tone: Within functional levels Left Upper Extremity Assessment LUE ROM/Strength/Tone: Within functional levels Right Lower Extremity Assessment RLE ROM/Strength/Tone: WFL for tasks assessed Left Lower Extremity Assessment LLE ROM/Strength/Tone: Deficits LLE ROM/Strength/Tone Deficits: decreased quad activation with quad sets but able to activate with short arc quads, unable to perform  isometric hip extension potentially more related to cognition than strength   Balance Balance Balance Assessed: Yes Static Sitting Balance Static Sitting - Balance Support: Bilateral upper extremity supported;Feet supported (foot) Static Sitting - Level of Assistance: 5: Stand by assistance Static Sitting - Comment/# of Minutes: 3 Static Standing Balance Static Standing - Balance Support: Bilateral upper extremity supported Static Standing - Level of Assistance: 3: Mod assist Static Standing - Comment/# of Minutes: 3  End of Session PT - End of Session Equipment Utilized During Treatment: Gait belt Activity Tolerance: Patient tolerated treatment well Patient left: in chair;with call bell/phone within reach Nurse Communication: Mobility status   [name] 06/17/2011, 9:08 AM  Delaney Meigs, PT [phone]

## 2011-06-17 NOTE — Progress Notes (Signed)
Pt. Has been having some spasm on the lt. Stump comes and go, given several pain med with breakthrough, wakes up with severe spasm on the lt BKA. Dr. Imogene Burn made aware and with orders made. Will cont. to monitor.

## 2011-06-17 NOTE — Progress Notes (Signed)
[facility]  Subjective  - Some pain last night, less today  Objective 176/85 89 99.4 F (37.4 C) (Oral) 16 97%  Intake/Output Summary (Last 24 hours) at 06/17/11 0833 Last data filed at 06/17/11 0000  Gross per 24 hour  Intake 2152.5 ml  Output    875 ml  Net 1277.5 ml   Left BKA dressing intact, no obvious drainage  Assessment/Planning: PT/OT Continue antibiotics until afebrile 48hrs  Aaron Long E 06/17/2011 8:33 AM --  Laboratory Lab Results:  Basename 06/17/11 0610 06/16/11 0550  WBC 15.2* 14.1*  HGB 9.2* 10.5*  HCT 28.8* 32.3*  PLT 350 307   BMET  Basename 06/17/11 0610 06/16/11 0550  NA 135 132*  K 5.0 5.0  CL 102 100  CO2 21 21  GLUCOSE 187* 169*  BUN 27* 32*  CREATININE 2.48* 2.71*  CALCIUM 8.9 9.4    COAG Lab Results  Component Value Date   INR 1.13 06/11/2011   INR 1.03 05/27/2011   No results found for this basename: PTT    Antibiotics Anti-infectives     Start     Dose/Rate Route Frequency Ordered Stop   06/16/11 1600   cefUROXime (ZINACEF) 1.5 g in dextrose 5 % 50 mL IVPB  Status:  Discontinued        1.5 g 100 mL/hr over 30 Minutes Intravenous Every 12 hours 06/16/11 1506 06/16/11 1515   06/15/11 1400   vancomycin (VANCOCIN) IVPB 1000 mg/200 mL premix        1,000 mg 200 mL/hr over 60 Minutes Intravenous Every 24 hours 06/14/11 1302     06/14/11 1400  piperacillin-tazobactam (ZOSYN) IVPB 3.375 g       3.375 g 12.5 mL/hr over 240 Minutes Intravenous 3 times per day 06/14/11 1304     06/14/11 1330   vancomycin (VANCOCIN) 1,500 mg in sodium chloride 0.9 % 500 mL IVPB        1,500 mg 250 mL/hr over 120 Minutes Intravenous NOW 06/14/11 1302 06/14/11 1609   06/14/11 1230   piperacillin-tazobactam (ZOSYN) IVPB 3.375 g  Status:  Discontinued        3.375 g 12.5 mL/hr over 240 Minutes Intravenous Every 6 hours 06/14/11 1225 06/14/11 1304   06/13/11 1448   cefUROXime (ZINACEF) 1.5 g in dextrose 5 %  50 mL IVPB        1.5 g 100 mL/hr over 30 Minutes Intravenous 30 min pre-op 06/13/11 1448 06/14/11 0947

## 2011-06-17 NOTE — Progress Notes (Signed)
Rehab admissions - Evaluated for possible admission.  I spoke with patient and gave him an inpatient rehab booklet.  Patient is interested in inpatient rehab prior to home.  He plans for wife and son to assist when he goes home.  I can potentially admit to inpatient rehab tomorrow, 06/07 if MD feels patient medically ready.  Will follow up in am.  Call me for questions.  #[phone]

## 2011-06-17 NOTE — Progress Notes (Signed)
ANTIBIOTIC CONSULT NOTE - FOLLOW UP  Pharmacy Consult for Vancomcyin Indication: Gangrene/Necrotic foot s/p BKA  No Known Allergies  Patient Measurements: Height: 5\' 8"  (172.7 cm) Weight: 217 lb 6.4 oz (98.612 kg) IBW/kg (Calculated) : 68.4  Adjusted Body Weight:   Vital Signs: Temp: 99.4 F (37.4 C) (06/06 0500) Temp src: Oral (06/06 0500) BP: 191/86 mmHg (06/06 1032) Pulse Rate: 97  (06/06 0900) Intake/Output from previous day: 06/05 0701 - 06/06 0700 In: 2152.5 [P.O.:720; I.V.:1432.5] Out: 875 [Urine:775; Blood:100] Intake/Output from this shift: Total I/O In: 240 [P.O.:240] Out: -   Labs:  Basename 06/17/11 0610 06/16/11 0550 06/15/11 1259  WBC 15.2* 14.1* 14.7*  HGB 9.2* 10.5* 9.7*  PLT 350 307 337  LABCREA -- -- --  CREATININE 2.48* 2.71* 2.48*   Estimated Creatinine Clearance: 31.1 ml/min (by C-G formula based on Cr of 2.48). No results found for this basename: VANCOTROUGH:2,VANCOPEAK:2,VANCORANDOM:2,GENTTROUGH:2,GENTPEAK:2,GENTRANDOM:2,TOBRATROUGH:2,TOBRAPEAK:2,TOBRARND:2,AMIKACINPEAK:2,AMIKACINTROU:2,AMIKACIN:2, in the last 72 hours   Microbiology: Recent Results (from the past 720 hour(s))  SURGICAL PCR SCREEN     Status: Abnormal   Collection Time   06/11/11  9:05 AM      Component Value Range Status Comment   MRSA, PCR POSITIVE (*) NEGATIVE  Final    Staphylococcus aureus POSITIVE (*) NEGATIVE  Final   WOUND CULTURE     Status: Normal   Collection Time   06/14/11 10:11 AM      Component Value Range Status Comment   Specimen Description WOUND LEFT FOOT   Final    Special Requests PT ON ZINACEF AND LEVAQUIN   Final    Gram Stain     Final    Value: RARE WBC PRESENT,BOTH PMN AND MONONUCLEAR     RARE SQUAMOUS EPITHELIAL CELLS PRESENT     FEW GRAM POSITIVE COCCI IN PAIRS     FEW GRAM NEGATIVE COCCOBACILLI   Culture     Final    Value: FEW METHICILLIN RESISTANT STAPHYLOCOCCUS AUREUS     Note: RIFAMPIN AND GENTAMICIN SHOULD NOT BE USED AS SINGLE DRUGS  FOR TREATMENT OF STAPH INFECTIONS. This organism DOES NOT demonstrate inducible Clindamycin resistance in vitro. CRITICAL RESULT CALLED TO, READ BACK BY AND VERIFIED WITH: CRISTEN COLER      06/17/11 AT 1000 AM BY [facility]   Report Status 06/17/2011 FINAL   Final    Organism ID, Bacteria METHICILLIN RESISTANT STAPHYLOCOCCUS AUREUS   Final     Anti-infectives     Start     Dose/Rate Route Frequency Ordered Stop   06/16/11 1600   cefUROXime (ZINACEF) 1.5 g in dextrose 5 % 50 mL IVPB  Status:  Discontinued        1.5 g 100 mL/hr over 30 Minutes Intravenous Every 12 hours 06/16/11 1506 06/16/11 1515   06/15/11 1400   vancomycin (VANCOCIN) IVPB 1000 mg/200 mL premix        1,000 mg 200 mL/hr over 60 Minutes Intravenous Every 24 hours 06/14/11 1302     06/14/11 1400  piperacillin-tazobactam (ZOSYN) IVPB 3.375 g       3.375 g 12.5 mL/hr over 240 Minutes Intravenous 3 times per day 06/14/11 1304     06/14/11 1330   vancomycin (VANCOCIN) 1,500 mg in sodium chloride 0.9 % 500 mL IVPB        1,500 mg 250 mL/hr over 120 Minutes Intravenous NOW 06/14/11 1302 06/14/11 1609   06/14/11 1230   piperacillin-tazobactam (ZOSYN) IVPB 3.375 g  Status:  Discontinued  3.375 g 12.5 mL/hr over 240 Minutes Intravenous Every 6 hours 06/14/11 1225 06/14/11 1304   06/13/11 1448   cefUROXime (ZINACEF) 1.5 g in dextrose 5 % 50 mL IVPB        1.5 g 100 mL/hr over 30 Minutes Intravenous 30 min pre-op 06/13/11 1448 06/14/11 0947          Assessment: 70yom on Vancomycin Day 4 for foot gangrene and possible sepsis. Patient returned to the OR on 6/5 for BKA but patient is still having fevers (Tmax 101.3). WBC are elevated but no blood cultures have been drawn. Based on [facility], patient did not receive his Vancomycin dose on 6/5 due to being in the OR/Automatically held during transfer. Per MD notes, will continue antibiotics until afebrile x 48hrs. Patient is also receiving Zosyn.  Goal of Therapy:  Vancomycin  trough level 15-20 mcg/ml  Plan:  1. Continue Vancomycin 1g IV q24h - next dose now 2. Continue Zosyn 3.375g IV q8h - appropriate for renal function  3. Monitor renal function, vitals, and clinical course. Will plan to check trough level once SS is reestablished (3-4 days) if continued that long  Cleon Dew [phone] 06/17/2011,11:50 AM

## 2011-06-17 NOTE — Consult Note (Signed)
Physical Medicine and Rehabilitation Consult Reason for Consult: Left BKA Referring Physician: Dr. Darrick Penna   HPI: Aaron Long is a right handed 71 y.o. male with history of diabetes mellitus , peripheral neuropathy and peripheral vascular disease. Admitted 06/10/2011 with dry gangrene of left fifth toe. Recent aortogram with 50% SFA, occluded popliteal and 1 vessel runoff via posterior tibial artery. Initially underwent amputation of left fifth toe with debridement of left foot 06/14/2011. Progressive ischemic changes noted as well as low-grade fever 102. Underwent left below-knee amputation 06/16/2011 per Dr. Darrick Penna. Placed on subcutaneous heparin for DVT prophylaxis. Blood sugars monitored with latest hemoglobin A1c is 7.9. Physical and occupational therapy evaluations are pending. M.D. is requested physical medicine rehabilitation consult to consider inpatient rehabilitation services   Review of Systems  Cardiovascular: Positive for leg swelling.  Gastrointestinal: Positive for constipation.  All other systems reviewed and are negative.   Past Medical History  Diagnosis Date  . Coronary artery disease   . Prostate cancer 2010  . PAD (peripheral artery disease)   . Gangrene of toe     dry  . Neuromuscular disorder     diabetic neuropathy  . Hypertension     takes Amlodipine daily and Metoprolol bid  . Hyperlipidemia     takes Pravastatin daily  . Myocardial infarction 2005  . Peripheral neuropathy   . Constipation     takes Miralax daily  . Hemorrhoids   . Hx of colonic polyps   . History of kidney stones   . Diabetes mellitus     Novolog and Levemir daily   Past Surgical History  Procedure Date  . Prostate surgery 2009    prostatectomy  . Knee cartilage surgery at age 78    left knee  . Cea     Right  . Coronary artery bypass graft 2005    x 4  . Carotid endarterectomy 2005    right  . Cataract surgery 2011    bilateral  . Colonoscopy   . Cardiac  catheterization 05/28/11  . Amputation 06/14/2011    Procedure: AMPUTATION DIGIT;  Surgeon: Sherren Kerns, MD;  Location: [facility] OR;  Service: Vascular;  Laterality: Left;  Amputation Left fifth toe   Family History  Problem Relation Age of Onset  . Coronary artery disease Neg Hx   . Anesthesia problems Neg Hx   . Hypotension Neg Hx   . Malignant hyperthermia Neg Hx   . Pseudochol deficiency Neg Hx    Social History:  reports that he quit smoking about 20 years ago. His smoking use included Cigarettes. He has a 40 pack-year smoking history. He has never used smokeless tobacco. He reports that he does not drink alcohol or use illicit drugs. Allergies: No Known Allergies Medications Prior to Admission  Medication Sig Dispense Refill  . amLODipine (NORVASC) 10 MG tablet Take 10 mg by mouth every morning.       [name] aspirin EC 81 MG tablet Take 81 mg by mouth every morning.       . insulin aspart (NOVOLOG) 100 UNIT/ML injection Inject 2-4 Units into the skin daily. Per sliding scale      . insulin detemir (LEVEMIR) 100 UNIT/ML injection Inject 26 Units into the skin 2 (two) times daily.       . metoprolol tartrate (LOPRESSOR) 25 MG tablet Take 50 mg by mouth 2 (two) times daily.      . polyethylene glycol (MIRALAX / GLYCOLAX) packet Take 17 g by mouth  daily.      . pravastatin (PRAVACHOL) 80 MG tablet Take 80 mg by mouth every morning.       . pregabalin (LYRICA) 75 MG capsule Take 75 mg by mouth 2 (two) times daily.        Home:    Functional History:   Functional Status:  Mobility:          ADL:    Cognition: Cognition Orientation Level: Oriented to person;Oriented to place;Oriented to time    Blood pressure 176/85, pulse 89, temperature 99.4 F (37.4 C), temperature source Oral, resp. rate 16, height 5\' 8"  (1.727 m), weight 98.612 kg (217 lb 6.4 oz), SpO2 97.00%. Physical Exam  Constitutional: He is oriented to person, place, and time. He appears well-developed.  HENT:    Head: Normocephalic.  Neck: Neck supple. No thyromegaly present.  Cardiovascular: Regular rhythm.   Pulmonary/Chest: Breath sounds normal. He has no wheezes.  Abdominal: He exhibits no distension. There is no tenderness.  Musculoskeletal:       Left BK stump appropriately tender and edematous. RLE is intact.   Neurological: He is alert and oriented to person, place, and time. A sensory deficit is present.       Motor exam grossly 4/5 UE, 3-4/5 RLE and 2-3 LLE.   He is drowsy and falls asleep.  Needed additional time for processing, orientation, etc.  Skin:       Left BKA site dressed  Psychiatric: He has a normal mood and affect. His behavior is normal.    Results for orders placed during the hospital encounter of 06/14/11 (from the past 24 hour(s))  GLUCOSE, CAPILLARY     Status: Abnormal   Collection Time   06/16/11  7:02 AM      Component Value Range   Glucose-Capillary 165 (*) 70 - 99 (mg/dL)  GLUCOSE, CAPILLARY     Status: Abnormal   Collection Time   06/16/11 11:00 AM      Component Value Range   Glucose-Capillary 116 (*) 70 - 99 (mg/dL)  GLUCOSE, CAPILLARY     Status: Normal   Collection Time   06/16/11  2:08 PM      Component Value Range   Glucose-Capillary 97  70 - 99 (mg/dL)  GLUCOSE, CAPILLARY     Status: Abnormal   Collection Time   06/16/11  3:22 PM      Component Value Range   Glucose-Capillary 102 (*) 70 - 99 (mg/dL)  GLUCOSE, CAPILLARY     Status: Abnormal   Collection Time   06/16/11  9:54 PM      Component Value Range   Glucose-Capillary 233 (*) 70 - 99 (mg/dL)   Comment 1 Documented in Chart     Comment 2 Notify RN     No results found.  Assessment/Plan: Diagnosis: left bka 1. Does the need for close, 24 hr/day medical supervision in concert with the patient's rehab needs make it unreasonable for this patient to be served in a less intensive setting? Yes 2. Co-Morbidities requiring supervision/potential complications: htn, ckd, dm, cad,  3. Due to bladder  management, bowel management, safety, skin/wound care, disease management, medication administration, pain management and patient education, does the patient require 24 hr/day rehab nursing? Yes 4. Does the patient require coordinated care of a physician, rehab nurse, PT (1-2 hrs/day, 5 days/week) and OT (1-2 hrs/day, 5 days/week) to address physical and functional deficits in the context of the above medical diagnosis(es)? Yes Addressing deficits in the following  areas: balance, endurance, locomotion, strength, transferring, bowel/bladder control, bathing, dressing, feeding, grooming, toileting and psychosocial support 5. Can the patient actively participate in an intensive therapy program of at least 3 hrs of therapy per day at least 5 days per week? Yes 6. The potential for patient to make measurable gains while on inpatient rehab is excellent 7. Anticipated functional outcomes upon discharge from inpatient rehab are wheel chair supervision to min assist with PT, wheel chair min assist to supervision with OT. 8. Estimated rehab length of stay to reach the above functional goals is: 1-2 weeks 9. Does the patient have adequate social supports to accommodate these discharge functional goals? Yes and Potentially 10. Anticipated D/C setting: Home 11. Anticipated post D/C treatments: HH therapy 12. Overall Rehab/Functional Prognosis: excellent  RECOMMENDATIONS: This patient's condition is appropriate for continued rehabilitative care in the following setting: CIR Patient has agreed to participate in recommended program. Yes and Potentially Note that insurance prior authorization may be required for reimbursement for recommended care.  Comment: Will follow for therapy and surgical progress.    Ivory Broad, MD 06/17/2011

## 2011-06-18 ENCOUNTER — Encounter (HOSPITAL_COMMUNITY): Payer: Self-pay | Admitting: Vascular Surgery

## 2011-06-18 LAB — CBC
HCT: 26.5 % — ABNORMAL LOW (ref 39.0–52.0)
Hemoglobin: 8.3 g/dL — ABNORMAL LOW (ref 13.0–17.0)
MCV: 92 fL (ref 78.0–100.0)
RBC: 2.88 MIL/uL — ABNORMAL LOW (ref 4.22–5.81)
WBC: 16.9 10*3/uL — ABNORMAL HIGH (ref 4.0–10.5)

## 2011-06-18 LAB — GLUCOSE, CAPILLARY
Glucose-Capillary: 113 mg/dL — ABNORMAL HIGH (ref 70–99)
Glucose-Capillary: 113 mg/dL — ABNORMAL HIGH (ref 70–99)

## 2011-06-18 LAB — HEMOGLOBIN A1C: Hgb A1c MFr Bld: 8.2 % — ABNORMAL HIGH (ref <5.7)

## 2011-06-18 LAB — BASIC METABOLIC PANEL
CO2: 21 mEq/L (ref 19–32)
Chloride: 105 mEq/L (ref 96–112)
Creatinine, Ser: 2.63 mg/dL — ABNORMAL HIGH (ref 0.50–1.35)
GFR calc Af Amer: 27 mL/min — ABNORMAL LOW (ref >90)
Potassium: 4.7 mEq/L (ref 3.5–5.1)
Sodium: 138 mEq/L (ref 135–145)

## 2011-06-18 NOTE — Progress Notes (Signed)
Rehab admissions - I spoke with wife today and yesterday afternoon.  Noted patient with fever and elevated WBC count.  I will watch over weekend and check back on Monday for progress and medical readiness.  Call me for questions.  #[phone]

## 2011-06-18 NOTE — Progress Notes (Addendum)
[facility] Progress Note  06/18/2011 8:29 AM POD 2  Subjective:  No complaints  Tm 100.7  160s-190s systolic  HR 90-100   94%2LO2NC Filed Vitals:   06/18/11 0618  BP: 163/77  Pulse: 100  Temp: 100.7 F (38.2 C)  Resp: 19    Physical Exam: Incisions:  C/d/i with staples in tact.  No drainage or evidence of infection.   CBC    Component Value Date/Time   WBC 16.9* 06/18/2011 0617   RBC 2.88* 06/18/2011 0617   HGB 8.3* 06/18/2011 0617   HCT 26.5* 06/18/2011 0617   PLT 342 06/18/2011 0617   MCV 92.0 06/18/2011 0617   MCH 28.8 06/18/2011 0617   MCHC 31.3 06/18/2011 0617   RDW 14.1 06/18/2011 0617   LYMPHSABS 2.3 04/30/2011 0341   MONOABS 0.6 04/30/2011 0341   EOSABS 0.2 04/30/2011 0341   BASOSABS 0.1 04/30/2011 0341    BMET    Component Value Date/Time   NA 138 06/18/2011 0617   K 4.7 06/18/2011 0617   CL 105 06/18/2011 0617   CO2 21 06/18/2011 0617   GLUCOSE 112* 06/18/2011 0617   BUN 25* 06/18/2011 0617   CREATININE 2.63* 06/18/2011 0617   CALCIUM 8.6 06/18/2011 0617   GFRNONAA 23* 06/18/2011 0617   GFRAA 27* 06/18/2011 0617    INR    Component Value Date/Time   INR 1.13 06/11/2011 0908   Wound Cx 06/14/11: Specimen Description WOUND LEFT FOOT Special Requests PT ON ZINACEF AND LEVAQUIN   Gram Stain RARE WBC PRESENT,BOTH PMN AND MONONUCLEAR RARE SQUAMOUS EPITHELIAL CELLS PRESENT FEW GRAM POSITIVE COCCI IN PAIRS FEW GRAM NEGATIVE COCCOBACILLI   Culture FEW METHICILLIN RESISTANT STAPHYLOCOCCUS AUREUS Note: RIFAMPIN AND GENTAMICIN SHOULD NOT BE USED AS SINGLE DRUGS FOR TREATMENT OF STAPH INFECTIONS. This organism DOES NOT demonstrate inducible Clindamycin resistance in vitro.   CRITICAL RESULT CALLED TO, READ BACK BY AND VERIFIED WITH: CRISTEN COLER  06/17/11 AT 1000 AM BY [facility] Report Status 06/17/2011 FINAL Organism ID, Bacteria METHICILLIN RESISTANT STAPHYLOCOCCUS AUREUS   Intake/Output Summary (Last 24 hours) at 06/18/11 2440 Last data filed at 06/17/11 1700   Gross per  24 hour  Intake   1080 ml  Output    500 ml  Net    580 ml     Assessment/Plan:  71 y.o. male is s/p left below knee amputation  POD 2 -acute surgical blood loss anemia-Hgb is 8.3 today-pt tolerating -creatinine is 2.63 up from 2.48, but this is still down from 6/5- will d/c IVF -continue ABx as WBC is up today and has trended up the past 2 days and pt still has fever. -will d/c foley and send urine for u/a and Cx -check labs in am. -Wound Cx grew out MRSA-continue Vanc   Doreatha Massed, PA-C [facility] [phone] 06/18/2011 8:29 AM   History and exam details as above.  Persistant fevers with BKA looking ok.  Agree with starting search for other possible sources.  If urine negative will check blood cultures and get duplex to rule out DVT.  Blood loss anemia asymptomatic but if drifts lower will consider transfusion.  Fabienne Bruns, MD [facility] [place] Office: [phone] Pager: [phone]

## 2011-06-18 NOTE — Progress Notes (Signed)
Physical Therapy Treatment Patient Details Name: Aaron Long MRN: [id number] DOB: 1940/06/16 Today's Date: 06/18/2011 Time: 7829-5621 PT Time Calculation (min): 29 min  PT Assessment / Plan / Recommendation Comments on Treatment Session  Pt s/p L BKA with increased muscle spasms left quad today limiting mobility with increased time for all mobility and cueing to relax left quad throughout mobility. Pt and family educated for LLE HEP and encouraged to perform. Will continue to follow to maximize mobility    Follow Up Recommendations       Barriers to Discharge        Equipment Recommendations       Recommendations for Other Services    Frequency     Plan Discharge plan remains appropriate;Frequency remains appropriate    Precautions / Restrictions Precautions Precautions: Fall Precaution Comments: L BKA   Pertinent Vitals/Pain Pain grossly 6/10 left LE with muscle spasms left thigh    Mobility  Bed Mobility Bed Mobility: Rolling Left;Left Sidelying to Sit;Sitting - Scoot to [place] of Bed Rolling Left: 3: Mod assist Left Sidelying to Sit: 3: Mod assist;HOB flat Sitting - Scoot to Edge of Bed: 3: Mod assist Details for Bed Mobility Assistance: cueing and assist for sequence of transfer with assist of pad to bring hips to EOB Transfers Transfers: Sit to Stand;Stand to Sit;Stand Pivot Transfers Sit to Stand: 1: +2 Total assist;From bed Sit to Stand: Patient Percentage: 50% Stand to Sit: 1: +2 Total assist;To chair/3-in-1 Stand to Sit: Patient Percentage: 40% Stand Pivot Transfers: 1: +2 Total assist Stand Pivot Transfers: Patient Percentage: 30% Details for Transfer Assistance: Pt with sequential cueing for hand placement and transfer with increased time to achieve standing in RW and pt leaning left in standing. Pt unable to unweight right foot to hop today and with pivoting to chair pt began flexing elbows and right knee and assuming crouched position in RW and required  total assist to pivot hips to chair and bring surfaces under pt for safety.  Ambulation/Gait Ambulation/Gait Assistance: Not tested (comment)    Exercises General Exercises - Lower Extremity Quad Sets: AROM;Left;5 reps;Supine (pt with decreased ability to hold quad set) Hip ABduction/ADduction: AROM;Left;5 reps;Supine   PT Diagnosis:    PT Problem List:   PT Treatment Interventions:     PT Goals Acute Rehab PT Goals PT Goal: Supine/Side to Sit - Progress: Progressing toward goal PT Goal: Sit to Stand - Progress: Progressing toward goal PT Goal: Stand to Sit - Progress: Progressing toward goal PT Transfer Goal: Bed to Chair/Chair to Bed - Progress: Progressing toward goal PT Goal: Ambulate - Progress: Not progressing PT Goal: Perform Home Exercise Program - Progress: Progressing toward goal  Visit Information  Last PT Received On: 06/18/11 Assistance Needed: +2    Subjective Data  Subjective: I did better yesterday   Cognition  Overall Cognitive Status: Impaired Area of Impairment: Memory;Attention;Safety/judgement Arousal/Alertness: Awake/alert Orientation Level: Disoriented to;Time Behavior During Session: WFL for tasks performed Current Attention Level: Sustained Memory Deficits: decreased recall of days events and of therapy yesterday Safety/Judgement: Decreased safety judgement for tasks assessed Cognition - Other Comments: Pt required cueing for posture to prevent fall as pt unaware of crouched position with transfer     Balance  Static Sitting Balance Static Sitting - Balance Support: Bilateral upper extremity supported Static Sitting - Level of Assistance: 5: Stand by assistance Static Sitting - Comment/# of Minutes: 4 Static Standing Balance Static Standing - Balance Support: Bilateral upper extremity supported Static Standing -  Level of Assistance: 1: +2 Total assist Static Standing - Comment/# of Minutes: pt with left lean in standing and assist to maintain  upright  End of Session PT - End of Session Equipment Utilized During Treatment: Gait belt Activity Tolerance: Patient limited by pain Patient left: in chair;with call bell/phone within reach;with family/visitor present Nurse Communication: Mobility status    [name] 06/18/2011, 3:08 PM Delaney Meigs, PT [phone]

## 2011-06-19 ENCOUNTER — Inpatient Hospital Stay (HOSPITAL_COMMUNITY): Payer: Medicare Other

## 2011-06-19 DIAGNOSIS — L97909 Non-pressure chronic ulcer of unspecified part of unspecified lower leg with unspecified severity: Secondary | ICD-10-CM

## 2011-06-19 LAB — GLUCOSE, CAPILLARY
Glucose-Capillary: 94 mg/dL (ref 70–99)
Glucose-Capillary: 165 mg/dL — ABNORMAL HIGH (ref 70–99)

## 2011-06-19 LAB — CBC
HCT: 28.8 % — ABNORMAL LOW (ref 39.0–52.0)
Hemoglobin: 9.2 g/dL — ABNORMAL LOW (ref 13.0–17.0)
MCHC: 31.9 g/dL (ref 30.0–36.0)
RBC: 3.17 MIL/uL — ABNORMAL LOW (ref 4.22–5.81)
WBC: 19.5 10*3/uL — ABNORMAL HIGH (ref 4.0–10.5)

## 2011-06-19 LAB — URINALYSIS, ROUTINE W REFLEX MICROSCOPIC
Leukocytes, UA: NEGATIVE
Protein, ur: 100 mg/dL — AB
Urobilinogen, UA: 0.2 mg/dL (ref 0.0–1.0)

## 2011-06-19 LAB — URINE MICROSCOPIC-ADD ON

## 2011-06-19 LAB — BASIC METABOLIC PANEL
BUN: 26 mg/dL — ABNORMAL HIGH (ref 6–23)
Chloride: 102 mEq/L (ref 96–112)
GFR calc Af Amer: 26 mL/min — ABNORMAL LOW (ref >90)
Potassium: 4.6 mEq/L (ref 3.5–5.1)

## 2011-06-19 MED ORDER — HEPARIN (PORCINE) IN NACL 100-0.45 UNIT/ML-% IJ SOLN
1400.0000 [IU]/h | INTRAMUSCULAR | Status: DC
  Administered 2011-06-19 – 2011-06-21 (×3): 1400 [IU]/h via INTRAVENOUS
  Filled 2011-06-19 (×3): qty 250

## 2011-06-19 MED ORDER — HEPARIN BOLUS VIA INFUSION
4500.0000 [IU] | Freq: Once | INTRAVENOUS | Status: AC
  Administered 2011-06-19: 4500 [IU] via INTRAVENOUS
  Filled 2011-06-19: qty 4500

## 2011-06-19 NOTE — Progress Notes (Signed)
ANTICOAGULATION CONSULT NOTE - Initial Consult  Pharmacy Consult for Heparin IV Indication: VTE Treatment- DVT in popliteal vein  No Known Allergies  Patient Measurements: Height: 5\' 8"  (172.7 cm) Weight: 217 lb 6.4 oz (98.612 kg) IBW/kg (Calculated) : 68.4  Heparin Dosing Weight: 89.4 kg  Vital Signs: Temp: 98.5 F (36.9 C) (06/08 1523) Temp src: Oral (06/08 1523) BP: 137/68 mmHg (06/08 1523) Pulse Rate: 91  (06/08 1523)  Labs:  Basename 06/19/11 0610 06/18/11 0617 06/17/11 0610  HGB 9.2* 8.3* --  HCT 28.8* 26.5* 28.8*  PLT 386 342 350  APTT -- -- --  LABPROT -- -- --  INR -- -- --  HEPARINUNFRC -- -- --  CREATININE 2.67* 2.63* 2.48*  CKTOTAL -- -- --  CKMB -- -- --  TROPONINI -- -- --    Estimated Creatinine Clearance: 28.9 ml/min (by C-G formula based on Cr of 2.67).   Medical History: Past Medical History  Diagnosis Date  . Coronary artery disease   . Prostate cancer 2010  . PAD (peripheral artery disease)   . Gangrene of toe     dry  . Neuromuscular disorder     diabetic neuropathy  . Hypertension     takes Amlodipine daily and Metoprolol bid  . Hyperlipidemia     takes Pravastatin daily  . Myocardial infarction 2005  . Peripheral neuropathy   . Constipation     takes Miralax daily  . Hemorrhoids   . Hx of colonic polyps   . History of kidney stones   . Diabetes mellitus     Novolog and Levemir daily    Medications:  Infusions:    Assessment: Aaron Long on Vancomycin per pharmacy for foot gangrene s/p BKA now to start IV heparin per [facility] for DVT in left popliteal vein.  Hgb low but stable, Plts stable. No overt bleeding noted. On sq heparin for prophylaxis changing to IV heparin. Last dose of SQ heparin at 1400.   Goal of Therapy:  Heparin level 0.3-0.7 units/ml Monitor platelets by anticoagulation protocol: Yes   Plan:  1. Heparin 4500 units, then heparin drip at 1400 units/hr (14 ml/hr). 2. Heparin level in 8 hours. 3. Daily HL and CBC.     Link Snuffer, PharmD, BCPS Clinical Pharmacist [phone] 06/19/2011,7:10 PM

## 2011-06-19 NOTE — Progress Notes (Signed)
Reported results of dopplers to Dr. Imogene Burn @ 1800.  No orders given. Lodema Pilot [facility]

## 2011-06-19 NOTE — Progress Notes (Signed)
*  Preliminary Results* Bilateral lower extremity venous duplex completed. Left lower extremity is positive for deep vein thrombosis involving the popliteal vein. Right lower extremity has no evidence of deep vein thrombosis.  06/19/2011 3:09 PM  Elpidio Galea, RDMS,RDCS

## 2011-06-19 NOTE — Progress Notes (Addendum)
[facility] [place]  Postoperative Visit - Amputation  Date of Surgery: 06/14/2011 - 06/16/2011 Procedure(s): AMPUTATION BELOW KNEE Left Surgeon: Surgeon(s): Sherren Kerns, MD POD: 3 Days Post-Op  Subjective Aaron Long is a 71 y.o. male who is S/P Left Procedure(s): AMPUTATION BELOW KNEE.  Pt.denies increased pain in the stump. The patient notes pain is well controlled. Pt. Is a candidate for rehab - poss monday  Significant Diagnostic Studies: CBC Lab Results  Component Value Date   WBC 19.5* 06/19/2011   HGB 9.2* 06/19/2011   HCT 28.8* 06/19/2011   MCV 90.9 06/19/2011   PLT 386 06/19/2011    BMET    Component Value Date/Time   NA 137 06/19/2011 0610   K 4.6 06/19/2011 0610   CL 102 06/19/2011 0610   CO2 20 06/19/2011 0610   GLUCOSE 87 06/19/2011 0610   BUN 26* 06/19/2011 0610   CREATININE 2.67* 06/19/2011 0610   CALCIUM 9.4 06/19/2011 0610   GFRNONAA 22* 06/19/2011 0610   GFRAA 26* 06/19/2011 0610    COAG Lab Results  Component Value Date   INR 1.13 06/11/2011   INR 1.03 05/27/2011   No results found for this basename: PTT     Intake/Output Summary (Last 24 hours) at 06/19/11 0846 Last data filed at 06/19/11 1610  Gross per 24 hour  Intake   1170 ml  Output   1000 ml  Net    170 ml   No data found.    Physical Examination  BP Readings from Last 3 Encounters:  06/19/11 159/72  06/19/11 159/72  06/19/11 159/72   Temp Readings from Last 3 Encounters:  06/19/11 100.2 F (37.9 C) Oral  06/19/11 100.2 F (37.9 C) Oral  06/19/11 100.2 F (37.9 C) Oral   SpO2 Readings from Last 3 Encounters:  06/19/11 94%  06/19/11 94%  06/19/11 94%   Pulse Readings from Last 3 Encounters:  06/19/11 94  06/19/11 94  06/19/11 94    Pt is A&Ox3  WDWN male with no complaints  Left amputation wound is healing well.  There is good bone coverage in the stump Stump is warm and well perfused, without drainage; without erythema   Assessment/plan: Still  spiking fevers and WBC continue to climb - UA negative Will get [facility] today/ DVT study and CXR to r/o these as source for fever Aaron Long is a 71 y.o. male who is s/p Left Procedure(s): AMPUTATION BELOW KNEE  [name] stump is viable.  Follow-up 4 weeks from surgery  Marlowe [place] 8:46 AM 06/19/[phone]  Addendum  I have independently interviewed and examined the patient, and I agree with the physician assistant's findings.  Fever work-up in progress.  Leonides Sake, MD [facility] [place] Office: [phone] Pager: [phone]  06/19/2011, 8:52 AM

## 2011-06-20 LAB — CBC
HCT: 25.2 % — ABNORMAL LOW (ref 39.0–52.0)
Hemoglobin: 8.1 g/dL — ABNORMAL LOW (ref 13.0–17.0)
MCH: 28.4 pg (ref 26.0–34.0)
MCHC: 32.1 g/dL (ref 30.0–36.0)
RBC: 2.85 MIL/uL — ABNORMAL LOW (ref 4.22–5.81)

## 2011-06-20 LAB — GLUCOSE, CAPILLARY
Glucose-Capillary: 64 mg/dL — ABNORMAL LOW (ref 70–99)
Glucose-Capillary: 205 mg/dL — ABNORMAL HIGH (ref 70–99)

## 2011-06-20 LAB — PREPARE RBC (CROSSMATCH)

## 2011-06-20 MED ORDER — VANCOMYCIN HCL IN DEXTROSE 1-5 GM/200ML-% IV SOLN
1000.0000 mg | INTRAVENOUS | Status: DC
  Administered 2011-06-20: 1000 mg via INTRAVENOUS
  Filled 2011-06-20 (×2): qty 200

## 2011-06-20 MED ORDER — FUROSEMIDE 10 MG/ML IJ SOLN
20.0000 mg | Freq: Once | INTRAMUSCULAR | Status: AC
  Administered 2011-06-20: 20 mg via INTRAVENOUS
  Filled 2011-06-20: qty 2

## 2011-06-20 NOTE — Progress Notes (Signed)
ANTICOAGULATION CONSULT NOTE - Follow Up Consult  Pharmacy Consult for Heparin Indication: DVT  No Known Allergies  Patient Measurements: Height: 5\' 8"  (172.7 cm) Weight: 217 lb 6.4 oz (98.612 kg) IBW/kg (Calculated) : 68.4  Heparin Dosing Weight: 89.4 kg  Vital Signs: Temp: 99.7 F (37.6 C) (06/09 0424) Temp src: Oral (06/09 0424) BP: 169/68 mmHg (06/09 0424) Pulse Rate: 102  (06/09 0424)  Labs:  Basename 06/20/11 0600 06/19/11 0610 06/18/11 0617  HGB 8.1* 9.2* --  HCT 25.2* 28.8* 26.5*  PLT 383 386 342  APTT -- -- --  LABPROT -- -- --  INR -- -- --  HEPARINUNFRC 0.44 -- --  CREATININE -- 2.67* 2.63*  CKTOTAL -- -- --  CKMB -- -- --  TROPONINI -- -- --    Estimated Creatinine Clearance: 28.9 ml/min (by C-G formula based on Cr of 2.67).   Medications:  Infusions:    . heparin 1,400 Units/hr (06/20/11 1002)    Assessment: 70 YOM on Vancomycin per pharmacy for foot gangrene s/p BKA now to start IV heparin per [facility] for DVT in left popliteal vein. Hgb low post-op, Plts stable. No overt bleeding noted. Plan for eventual Coumadin.   Goal of Therapy:  Heparin level 0.3-0.7 units/ml Monitor platelets by anticoagulation protocol: Yes   Plan:  1. Continue heparin at 1400 units/hr (14 ml/hr). 2. Follow-up daily HL and CBC.   Link Snuffer, PharmD, BCPS Clinical Pharmacist [phone] 06/20/2011,10:03 AM

## 2011-06-20 NOTE — Progress Notes (Signed)
[facility] [place]  Daily Progress Note  Assessment/Planning: POD #4 s/p L BKA   Venous duplex: +pop DVT -> Heparin drip and eventually coumadin  Not certain if DVT will interfere with inpt rehab plans  Cont PT  HCT decreased and mildly tachycardiac: in this pt w/ CAD and prior MI, the risks of transfusions outweigh the disadvantages  Subjective  - 4 Days Post-Op  None, cooperating with PT  Objective Filed Vitals:   06/19/11 0637 06/19/11 1523 06/19/11 2244 06/20/11 0424  BP: 159/72 137/68 174/77 169/68  Pulse: 94 91 101 102  Temp: 100.2 F (37.9 C) 98.5 F (36.9 C) 101.2 F (38.4 C) 99.7 F (37.6 C)  TempSrc: Oral Oral Oral Oral  Resp: 20 18 18 18   Height:      Weight:      SpO2: 94% 94% 99% 98%    Intake/Output Summary (Last 24 hours) at 06/20/11 0749 Last data filed at 06/20/11 0601  Gross per 24 hour  Intake 404.63 ml  Output    650 ml  Net -245.37 ml    PULM  CTAB CV  RRR GI  soft, NTND VASC  L BKA stump c/d/i, decreased petechiae  Laboratory CBC    Component Value Date/Time   WBC 15.7* 06/20/2011 0600   HGB 8.1* 06/20/2011 0600   HCT 25.2* 06/20/2011 0600   PLT 383 06/20/2011 0600    BMET    Component Value Date/Time   NA 137 06/19/2011 0610   K 4.6 06/19/2011 0610   CL 102 06/19/2011 0610   CO2 20 06/19/2011 0610   GLUCOSE 87 06/19/2011 0610   BUN 26* 06/19/2011 0610   CREATININE 2.67* 06/19/2011 0610   CALCIUM 9.4 06/19/2011 0610   GFRNONAA 22* 06/19/2011 0610   GFRAA 26* 06/19/2011 0610    Leonides Sake, MD [facility] [place] Office: [phone] Pager: [phone]  06/20/2011, 7:49 AM

## 2011-06-20 NOTE — Progress Notes (Signed)
CBG: 69         Treatment: 15 GM carbohydrate snack  Symptoms: Sweaty  Follow-up CBG: Time:0602 CBG Result:64  Possible Reasons for Event: Unknown  Comments/MD notified:    Mercy Moore

## 2011-06-20 NOTE — Progress Notes (Signed)
CBG: 64  Treatment: 15 GM carbohydrate snack  Symptoms: None  Follow-up CBG: Time:0641 CBG Result:205  Possible Reasons for Event: Other: did not eat all of snack at 0400 even though wife was feeding him.  Comments/MD notified:    Mercy Moore

## 2011-06-21 ENCOUNTER — Inpatient Hospital Stay (HOSPITAL_COMMUNITY)
Admission: AD | Admit: 2011-06-21 | Discharge: 2011-07-01 | DRG: 945 | Disposition: A | Discharge status: Home Health | Payer: Medicare Other | Source: Ambulatory Visit | Attending: Physical Medicine & Rehabilitation | Admitting: Physical Medicine & Rehabilitation

## 2011-06-21 DIAGNOSIS — I10 Essential (primary) hypertension: Secondary | ICD-10-CM | POA: Diagnosis not present

## 2011-06-21 DIAGNOSIS — I70269 Atherosclerosis of native arteries of extremities with gangrene, unspecified extremity: Secondary | ICD-10-CM

## 2011-06-21 DIAGNOSIS — I2510 Atherosclerotic heart disease of native coronary artery without angina pectoris: Secondary | ICD-10-CM | POA: Diagnosis not present

## 2011-06-21 DIAGNOSIS — Z8546 Personal history of malignant neoplasm of prostate: Secondary | ICD-10-CM | POA: Diagnosis not present

## 2011-06-21 DIAGNOSIS — E1142 Type 2 diabetes mellitus with diabetic polyneuropathy: Secondary | ICD-10-CM

## 2011-06-21 DIAGNOSIS — Z87891 Personal history of nicotine dependence: Secondary | ICD-10-CM | POA: Diagnosis not present

## 2011-06-21 DIAGNOSIS — K5900 Constipation, unspecified: Secondary | ICD-10-CM | POA: Diagnosis not present

## 2011-06-21 DIAGNOSIS — Z7901 Long term (current) use of anticoagulants: Secondary | ICD-10-CM

## 2011-06-21 DIAGNOSIS — I824Y9 Acute embolism and thrombosis of unspecified deep veins of unspecified proximal lower extremity: Secondary | ICD-10-CM

## 2011-06-21 DIAGNOSIS — Z7982 Long term (current) use of aspirin: Secondary | ICD-10-CM | POA: Diagnosis not present

## 2011-06-21 DIAGNOSIS — E1159 Type 2 diabetes mellitus with other circulatory complications: Secondary | ICD-10-CM | POA: Diagnosis not present

## 2011-06-21 DIAGNOSIS — Z794 Long term (current) use of insulin: Secondary | ICD-10-CM

## 2011-06-21 DIAGNOSIS — A4902 Methicillin resistant Staphylococcus aureus infection, unspecified site: Secondary | ICD-10-CM

## 2011-06-21 DIAGNOSIS — Z951 Presence of aortocoronary bypass graft: Secondary | ICD-10-CM

## 2011-06-21 DIAGNOSIS — S78119A Complete traumatic amputation at level between unspecified hip and knee, initial encounter: Secondary | ICD-10-CM

## 2011-06-21 DIAGNOSIS — S88119A Complete traumatic amputation at level between knee and ankle, unspecified lower leg, initial encounter: Secondary | ICD-10-CM

## 2011-06-21 DIAGNOSIS — E1149 Type 2 diabetes mellitus with other diabetic neurological complication: Secondary | ICD-10-CM

## 2011-06-21 DIAGNOSIS — I739 Peripheral vascular disease, unspecified: Secondary | ICD-10-CM

## 2011-06-21 DIAGNOSIS — Z5189 Encounter for other specified aftercare: Secondary | ICD-10-CM | POA: Diagnosis not present

## 2011-06-21 DIAGNOSIS — E785 Hyperlipidemia, unspecified: Secondary | ICD-10-CM

## 2011-06-21 DIAGNOSIS — L98499 Non-pressure chronic ulcer of skin of other sites with unspecified severity: Secondary | ICD-10-CM | POA: Diagnosis not present

## 2011-06-21 LAB — URINE MICROSCOPIC-ADD ON

## 2011-06-21 LAB — TYPE AND SCREEN

## 2011-06-21 LAB — GLUCOSE, CAPILLARY
Glucose-Capillary: 165 mg/dL — ABNORMAL HIGH (ref 70–99)
Glucose-Capillary: 127 mg/dL — ABNORMAL HIGH (ref 70–99)

## 2011-06-21 LAB — URINALYSIS, ROUTINE W REFLEX MICROSCOPIC
Bilirubin Urine: NEGATIVE
Nitrite: NEGATIVE
Specific Gravity, Urine: 1.012 (ref 1.005–1.030)
Urobilinogen, UA: 1 mg/dL (ref 0.0–1.0)

## 2011-06-21 LAB — CBC
HCT: 30 % — ABNORMAL LOW (ref 39.0–52.0)
MCH: 28.8 pg (ref 26.0–34.0)
MCHC: 32.3 g/dL (ref 30.0–36.0)
MCV: 89 fL (ref 78.0–100.0)
RDW: 14.2 % (ref 11.5–15.5)

## 2011-06-21 LAB — HEPARIN LEVEL (UNFRACTIONATED): Heparin Unfractionated: >2 IU/mL — ABNORMAL HIGH (ref 0.30–0.70)

## 2011-06-21 MED ORDER — PANTOPRAZOLE SODIUM 40 MG PO TBEC
40.0000 mg | DELAYED_RELEASE_TABLET | Freq: Every day | ORAL | Status: DC
  Administered 2011-06-22 – 2011-06-30 (×8): 40 mg via ORAL
  Filled 2011-06-21 (×9): qty 1

## 2011-06-21 MED ORDER — WARFARIN - PHARMACIST DOSING INPATIENT
Freq: Every day | Status: DC
  Administered 2011-06-22 – 2011-06-29 (×3)

## 2011-06-21 MED ORDER — SORBITOL 70 % SOLN
30.0000 mL | Freq: Every day | Status: DC | PRN
  Filled 2011-06-21: qty 30

## 2011-06-21 MED ORDER — SENNOSIDES-DOCUSATE SODIUM 8.6-50 MG PO TABS
1.0000 | ORAL_TABLET | Freq: Every evening | ORAL | Status: DC | PRN
  Administered 2011-06-29: 1 via ORAL
  Filled 2011-06-21 (×2): qty 1

## 2011-06-21 MED ORDER — WARFARIN SODIUM 7.5 MG PO TABS
7.5000 mg | ORAL_TABLET | Freq: Once | ORAL | Status: AC
  Administered 2011-06-21: 7.5 mg via ORAL
  Filled 2011-06-21: qty 1

## 2011-06-21 MED ORDER — INSULIN ASPART 100 UNIT/ML ~~LOC~~ SOLN
0.0000 [IU] | Freq: Three times a day (TID) | SUBCUTANEOUS | Status: DC
  Administered 2011-06-21 – 2011-06-22 (×2): 4 [IU] via SUBCUTANEOUS
  Administered 2011-06-23 – 2011-06-24 (×2): 3 [IU] via SUBCUTANEOUS
  Administered 2011-06-25: 1 [IU] via SUBCUTANEOUS
  Administered 2011-06-26: 3 [IU] via SUBCUTANEOUS
  Administered 2011-06-27 – 2011-06-29 (×3): 4 [IU] via SUBCUTANEOUS
  Administered 2011-06-30: 3 [IU] via SUBCUTANEOUS

## 2011-06-21 MED ORDER — ALUM & MAG HYDROXIDE-SIMETH 200-200-20 MG/5ML PO SUSP: 15.0000 mL | ORAL | Status: DC | PRN

## 2011-06-21 MED ORDER — ONDANSETRON HCL 4 MG PO TABS: 4.0000 mg | ORAL_TABLET | Freq: Four times a day (QID) | ORAL | Status: DC | PRN

## 2011-06-21 MED ORDER — POLYETHYLENE GLYCOL 3350 17 G PO PACK
17.0000 g | PACK | Freq: Every day | ORAL | Status: DC
  Administered 2011-06-22 – 2011-07-01 (×10): 17 g via ORAL
  Filled 2011-06-21 (×12): qty 1

## 2011-06-21 MED ORDER — ACETAMINOPHEN 325 MG PO TABS
325.0000 mg | ORAL_TABLET | ORAL | Status: DC | PRN
  Administered 2011-06-21 – 2011-06-22 (×2): 650 mg via ORAL
  Filled 2011-06-21 (×3): qty 2

## 2011-06-21 MED ORDER — ONDANSETRON HCL 4 MG/2ML IJ SOLN
4.0000 mg | Freq: Four times a day (QID) | INTRAMUSCULAR | Status: DC | PRN
Start: 2011-06-21 — End: 2011-07-01

## 2011-06-21 MED ORDER — OXYCODONE HCL 5 MG PO TABS
5.0000 mg | ORAL_TABLET | ORAL | Status: DC | PRN
  Administered 2011-06-22 (×2): 5 mg via ORAL
  Administered 2011-06-23 (×3): 10 mg via ORAL
  Administered 2011-06-24 (×2): 5 mg via ORAL
  Administered 2011-06-24 – 2011-06-30 (×17): 10 mg via ORAL
  Filled 2011-06-21: qty 2
  Filled 2011-06-21: qty 1
  Filled 2011-06-21 (×15): qty 2
  Filled 2011-06-21: qty 1
  Filled 2011-06-21: qty 2
  Filled 2011-06-21: qty 1
  Filled 2011-06-21: qty 2
  Filled 2011-06-21: qty 1
  Filled 2011-06-21 (×3): qty 2

## 2011-06-21 MED ORDER — AMLODIPINE BESYLATE 10 MG PO TABS
10.0000 mg | ORAL_TABLET | Freq: Every morning | ORAL | Status: DC
  Administered 2011-06-22 – 2011-06-30 (×9): 10 mg via ORAL
  Filled 2011-06-21 (×10): qty 1

## 2011-06-21 MED ORDER — SIMVASTATIN 5 MG PO TABS
5.0000 mg | ORAL_TABLET | Freq: Every day | ORAL | Status: DC
  Administered 2011-06-21 – 2011-06-30 (×10): 5 mg via ORAL
  Filled 2011-06-21 (×11): qty 1

## 2011-06-21 MED ORDER — PREGABALIN 50 MG PO CAPS
75.0000 mg | ORAL_CAPSULE | Freq: Two times a day (BID) | ORAL | Status: DC
  Administered 2011-06-21 – 2011-06-23 (×5): 75 mg via ORAL
  Filled 2011-06-21: qty 2
  Filled 2011-06-21 (×4): qty 1

## 2011-06-21 MED ORDER — ASPIRIN EC 81 MG PO TBEC
81.0000 mg | DELAYED_RELEASE_TABLET | Freq: Every morning | ORAL | Status: DC
  Administered 2011-06-22 – 2011-06-30 (×9): 81 mg via ORAL
  Filled 2011-06-21 (×10): qty 1

## 2011-06-21 MED ORDER — HEPARIN (PORCINE) IN NACL 100-0.45 UNIT/ML-% IJ SOLN
1600.0000 [IU]/h | INTRAMUSCULAR | Status: DC
  Administered 2011-06-21: 1400 [IU]/h via INTRAVENOUS
  Administered 2011-06-22 – 2011-06-23 (×3): 1600 [IU]/h via INTRAVENOUS
  Filled 2011-06-21 (×5): qty 250

## 2011-06-21 MED ORDER — CIPROFLOXACIN HCL 250 MG PO TABS
250.0000 mg | ORAL_TABLET | Freq: Two times a day (BID) | ORAL | Status: DC
  Administered 2011-06-21 – 2011-06-28 (×14): 250 mg via ORAL
  Filled 2011-06-21 (×16): qty 1

## 2011-06-21 MED ORDER — HEPARIN (PORCINE) IN NACL 100-0.45 UNIT/ML-% IJ SOLN
1400.0000 [IU]/h | INTRAMUSCULAR | Status: DC
  Administered 2011-06-21: 1400 [IU]/h via INTRAVENOUS
  Filled 2011-06-21 (×2): qty 250

## 2011-06-21 MED ORDER — INSULIN DETEMIR 100 UNIT/ML ~~LOC~~ SOLN
26.0000 [IU] | Freq: Two times a day (BID) | SUBCUTANEOUS | Status: DC
  Administered 2011-06-21: 26 [IU] via SUBCUTANEOUS

## 2011-06-21 NOTE — Progress Notes (Addendum)
[facility] [place]  Postoperative Visit - Amputation  Date of Surgery: 06/14/2011 - 06/16/2011 Procedure(s): AMPUTATION BELOW KNEE Left Surgeon: Surgeon(s): Sherren Kerns, MD POD: 5 Days Post-Op  Subjective Aaron Long is a 71 y.o. male who is S/P Left  AMPUTATION BELOW KNEE.  Pt.denies increased pain in the stump. The patient notes pain is well controlled. Pt. Has limb guard placed. + DVT - on heparin, will start coumadin.  Significant Diagnostic Studies: CBC Lab Results  Component Value Date   WBC 13.7* 06/21/2011   HGB 9.7* 06/21/2011   HCT 30.0* 06/21/2011   MCV 89.0 06/21/2011   PLT 413* 06/21/2011    BMET    Component Value Date/Time   NA 137 06/19/2011 0610   K 4.6 06/19/2011 0610   CL 102 06/19/2011 0610   CO2 20 06/19/2011 0610   GLUCOSE 87 06/19/2011 0610   BUN 26* 06/19/2011 0610   CREATININE 2.67* 06/19/2011 0610   CALCIUM 9.4 06/19/2011 0610   GFRNONAA 22* 06/19/2011 0610   GFRAA 26* 06/19/2011 0610    COAG Lab Results  Component Value Date   INR 1.13 06/11/2011   INR 1.03 05/27/2011   No results found for this basename: PTT     Intake/Output Summary (Last 24 hours) at 06/21/11 1024 Last data filed at 06/21/11 0730  Gross per 24 hour  Intake 1137.14 ml  Output    400 ml  Net 737.14 ml   No data found.    Physical Examination  BP Readings from Last 3 Encounters:  06/21/11 146/72  06/21/11 146/72  06/21/11 146/72   Temp Readings from Last 3 Encounters:  06/21/11 98.5 F (36.9 C) Oral  06/21/11 98.5 F (36.9 C) Oral  06/21/11 98.5 F (36.9 C) Oral   SpO2 Readings from Last 3 Encounters:  06/21/11 96%  06/21/11 96%  06/21/11 96%   Pulse Readings from Last 3 Encounters:  06/21/11 88  06/21/11 88  06/21/11 88    Pt is A&Ox3  WDWN male with no complaints  Left amputation wound is healing well.  There is good bone coverage in the stump Stump is warm and well perfused, without drainage; without  erythema   Assessment/plan:  Aaron Long is a 71 y.o. male who is s/p Left  AMPUTATION BELOW KNEE  [name] stump is viable.  DVT left popliteal artery - on heparin - will start coumadin  Transfer to Rehab when they OK transfer  Follow-up 4 weeks from surgery  IS for atelectasis  WBC in downward trend and pt afebrile x 24 hours  ROCZNIAK,REGINA Long 10:24 AM 06/21/[phone]  Addendum  I have independently interviewed and examined the patient, and I agree with the physician assistant's findings.  Stump is viable.  Start coumadin since Heparin gtt therapeutic.  To Rehab soon.  Follow up with Dr. Darrick Penna in 4 weeks for staple removal.   Aaron Sake, MD [facility] [place] Office: [phone] Pager: [phone]  06/21/2011, 12:01 PM

## 2011-06-21 NOTE — Progress Notes (Signed)
Pt transferred to Rehab from 2000. Orientated to room, and rehab expectation. Discussed safety plan, and viewed safety video. Educational handouts, and Dentist provided. Pt's butterfly wish is to 'get better, and be able to take care of himself'.

## 2011-06-21 NOTE — Progress Notes (Signed)
ANTICOAGULATION CONSULT NOTE - Follow Up Consult ANTIBIOTIC CONSULT NOTE - Follow Up   Pharmacy Consult for Heparin/ start Coumadin Indication: DVT  No Known Allergies  Patient Measurements:   Heparin Dosing Weight: 89.4 kg  Vital Signs: Temp: 101.3 F (38.5 C) (06/10 1600) Temp src: Oral (06/10 1600) BP: 162/70 mmHg (06/10 1600) Pulse Rate: 85  (06/10 1600)  Labs:  Basename 06/21/11 0940 06/21/11 0630 06/20/11 0600 06/19/11 0610  HGB -- 9.7* 8.1* --  HCT -- 30.0* 25.2* 28.8*  PLT -- 413* 383 386  APTT -- -- -- --  LABPROT -- -- -- --  INR -- -- -- --  HEPARINUNFRC 0.34 >2.00* 0.44 --  CREATININE -- -- -- 2.67*  CKTOTAL -- -- -- --  CKMB -- -- -- --  TROPONINI -- -- -- --    The CrCl is unknown because both a height and weight (above a minimum accepted value) are required for this calculation.   Medications:  Infusions:     Assessment: 70 YOM on IV heparin drip @ 1400 units/hr. Today's AM heparin level reported at >2.  Previously therapeutic @0 .44 yesterday on the same heparin rate, thus HL of >2 did not seem accurated. No bleeding reported per RN and no complications with heparin infusion. Ordered STAT heparin level and held heparin drip until result back. This HL was 0.34, thus resumed IV heparin 1400 units/hr for DVT. Now to start Coumadin. Baseline INR 1.13 on 06/11/11.  CBC stable  Goal of Therapy:  Heparin level 0.3-0.7 units/ml Monitor platelets by anticoagulation protocol: Yes INR 2-3   Plan:  Continue heparin at 1400 units/hr (14 ml/hr). Coumadin 7.5mg  today x1 Daily HL, INR and CBC.

## 2011-06-21 NOTE — Progress Notes (Signed)
Rehab admissions - Continuing to follow.  Noted DVT and currently on heparin drip.  On rehab, we can accommodate heparin drip and can transition to coumadin as appropriate.  I spoke with patient's wife again this am.  Ready to admit to inpatient rehab today and we do have a rehab bed available.  Call me for questions.  #[phone]

## 2011-06-21 NOTE — Discharge Summary (Signed)
[facility] Discharge Summary   Patient ID:  Aaron Long MRN: [id number] DOB/AGE: May 14, 1940 71 y.o.  Admit date: 06/14/2011 Discharge date: 06/21/2011 Date of Surgery: 06/14/2011 - 06/16/2011 Surgeon: Moishe Spice): Sherren Kerns, MD  Admission Diagnosis: Peripheral Arterial Disease pvd with gangrene  Discharge Diagnoses:  Peripheral Arterial Disease pvd with gangrene  Secondary Diagnoses: Past Medical History  Diagnosis Date  . Coronary artery disease   . Prostate cancer 2010  . PAD (peripheral artery disease)   . Gangrene of toe     dry  . Neuromuscular disorder     diabetic neuropathy  . Hypertension     takes Amlodipine daily and Metoprolol bid  . Hyperlipidemia     takes Pravastatin daily  . Myocardial infarction 2005  . Peripheral neuropathy   . Constipation     takes Miralax daily  . Hemorrhoids   . Hx of colonic polyps   . History of kidney stones   . Diabetes mellitus     Novolog and Levemir daily    Procedure(s): AMPUTATION BELOW KNEE  Discharged Condition: good  HPI:  Aaron Long is a 71 y.o. male who presents for follow-up evaluation for PAD. He is on Aspirin for antiplatelet therapy. He was recently seen as an inpatient consult for dry gangrene of his left fifth toe. At that time he also had renal insufficiency and was evaluated by the nephrology service. It was decided to defer his arteriogram until his kidney function can improve somewhat. He returns today for further followup. He has no pain in the left foot. He has had no drainage from the left toe. There was cellulitis in his left foot of admission this is now resolved. His atherosclerotic risk factors remain diabetes, elevated cholesterol, smoking( remote 1993), and coronary artery disease. These are all currently stable and followed by the primary care physician. He is also being followed by Dr. Caryn Section with nephrology. His recent arteriogram showed severe tibial disease but  he did have a posterior tibial target. Vein mapping showed reasonable vein at least to the knee. He has had some increased smell and redness in the left foot over the past few days. He recently saw Dr Rosalie Gums and had a negative stress test. Pt was to have Left fem -tibial bypass but he became septic and was admitted for incise and drain his left foot as well as amputate his toe due to the fact that he is currently septic  Hospital Course:  Aaron Long is a 70 y.o. male is S/P I&D of left foot on 06/14/11 followed by Left AMPUTATION BELOW KNEE on 06/16/11 Extubated: POD # 0 Post-op wounds healing well Pt. Ambulating, voiding and taking PO diet without difficulty. Pt pain controlled with PO pain meds. Labs as below Complications:pt had post-op fever and increasing white count. He had some atelectasis on CXR but was also found to have DVT of POP artery on left. He was started on Heparin drip and coumadin. He was started in IS for atelectasis. He became afebrile and WBC decreased and he was transferred to IP Rehab  Consults: PT/OT/CIR    Significant Diagnostic Studies: CBC Lab Results  Component Value Date   WBC 13.7* 06/21/2011   HGB 9.7* 06/21/2011   HCT 30.0* 06/21/2011   MCV 89.0 06/21/2011   PLT 413* 06/21/2011    BMET    Component Value Date/Time   NA 137 06/19/2011 0610   K 4.6 06/19/2011 0610   CL 102 06/19/2011  0610   CO2 20 06/19/2011 0610   GLUCOSE 87 06/19/2011 0610   BUN 26* 06/19/2011 0610   CREATININE 2.67* 06/19/2011 0610   CALCIUM 9.4 06/19/2011 0610   GFRNONAA 22* 06/19/2011 0610   GFRAA 26* 06/19/2011 0610   COAG Lab Results  Component Value Date   INR 1.13 06/11/2011   INR 1.03 05/27/2011     Disposition:  Discharge to :Rehab  Discharge Orders    Future Appointments: Provider: Department: Dept Phone: Center:   07/01/2011 4:00 PM Sherren Kerns, MD [facility] [phone] [facility]      [place] [place]  Home Medication Instructions [id number]   Printed  on:06/21/11 1210  Medication Information                    pravastatin (PRAVACHOL) 80 MG tablet Take 80 mg by mouth every morning.            insulin detemir (LEVEMIR) 100 UNIT/ML injection Inject 26 Units into the skin 2 (two) times daily.            aspirin EC 81 MG tablet Take 81 mg by mouth every morning.            pregabalin (LYRICA) 75 MG capsule Take 75 mg by mouth 2 (two) times daily.           polyethylene glycol (MIRALAX / GLYCOLAX) packet Take 17 g by mouth daily.           metoprolol tartrate (LOPRESSOR) 25 MG tablet Take 50 mg by mouth 2 (two) times daily.           amLODipine (NORVASC) 10 MG tablet Take 10 mg by mouth every morning.            insulin aspart (NOVOLOG) 100 UNIT/ML injection Inject 2-4 Units into the skin daily. Per sliding scale            Verbal and written Discharge instructions given to the patient. Wound care per Discharge AVS   Signed: Marlowe Shores 06/21/2011, 12:10 PM

## 2011-06-21 NOTE — Plan of Care (Signed)
Overall Plan of Care [facility]) Patient Details Name: Aaron Long MRN: [id number] DOB: October 04, 1940  Diagnosis:  Left BKA  Primary Diagnosis:    S/P BKA (below knee amputation) Co-morbidities: pain mgt, dm, abla, wound care  Functional Problem List  Patient demonstrates impairments in the following areas: Balance, Bladder, Bowel, Edema, Endurance, Motor, Pain, Safety, Sensory  and Skin Integrity  Basic ADL's: grooming, bathing, dressing and toileting Advanced ADL's: n/a at this time  Transfers:  bed mobility, bed to chair, toilet, tub/shower, car and furniture Locomotion:  wheelchair mobility and short distance ambulation  Additional Impairments:  Leisure Awareness  Anticipated Outcomes Item Anticipated Outcome  Eating/Swallowing    Basic self-care  supervision  Tolieting  supervision  Bowel/Bladder  Mod I  Transfers  Mod I  Locomotion  Mod I w/c level; Min A short distance amb with RW  Communication    Cognition    Pain  <5  Safety/Judgment    Other     Therapy Plan: PT Frequency: 1-2 X/day, 60-90 minutes OT Frequency: 1-2 X/day, 60-90 minutes     Team Interventions: Item RN PT OT SLP SW TR Other  Self Care/Advanced ADL Retraining  x x      Neuromuscular Re-Education  x x      Therapeutic Activities  x x      UE/LE Strength Training/ROM  x x      UE/LE Coordination Activities  x x      Visual/Perceptual Remediation/Compensation         DME/Adaptive Equipment Instruction  x x      Therapeutic Exercise  x x      Balance/Vestibular Training  x x      Patient/Family Education  x x      Cognitive Remediation/Compensation         Functional Mobility Training  x x      Ambulation/Gait Training  x x      Stair Training  x       Wheelchair Propulsion/Positioning  x x      Functional Tourist information centre manager Reintegration  x x      Dysphagia/Aspiration Film/video editor         Bladder Management x         Bowel Management x        Disease Management/Prevention x x x      Pain Management x x x      Medication Management x        Skin Care/Wound Management x x x      Splinting/Orthotics  x x      Discharge Planning x x x  x    Psychosocial Support x x x  x                       Team Discharge Planning: Destination:  Home Projected Follow-up:  PT, OT and Outpatient Projected Equipment Needs:  Bedside Commode, Tub Bench, Walker and Wheelchair Patient/family involved in discharge planning:  Yes  MD ELOS: 10 days Medical Rehab Prognosis:  Excellent Assessment: Pt admitted of CIR therapies. Goals are set at supervision to Mod I at a wheelchair. Therapies will be addressing self-care, safety, fxnl mobility, transfers, pain mgt, adaptive equipment, wound care, pre-prosthetic ed. Pt is motivated and willing to work in therapies.

## 2011-06-21 NOTE — Progress Notes (Signed)
ANTICOAGULATION CONSULT NOTE - Follow Up Consult ANTIBIOTIC CONSULT NOTE - Follow Up   Pharmacy Consult for Heparin/ start Coumadin  Vancomycin/ Zosyn Indication: DVT; Foot gangrene/necrosis possible sepsis s/p left BKA  No Known Allergies  Patient Measurements: Height: 5\' 8"  (172.7 cm) Weight: 217 lb 6.4 oz (98.612 kg) IBW/kg (Calculated) : 68.4  Heparin Dosing Weight: 89.4 kg  Vital Signs: Temp: 98.5 F (36.9 C) (06/10 0450) Temp src: Oral (06/10 0450) BP: 159/74 mmHg (06/10 1138) Pulse Rate: 88  (06/10 0748)  Labs:  Basename 06/21/11 0940 06/21/11 0630 06/20/11 0600 06/19/11 0610  HGB -- 9.7* 8.1* --  HCT -- 30.0* 25.2* 28.8*  PLT -- 413* 383 386  APTT -- -- -- --  LABPROT -- -- -- --  INR -- -- -- --  HEPARINUNFRC 0.34 >2.00* 0.44 --  CREATININE -- -- -- 2.67*  CKTOTAL -- -- -- --  CKMB -- -- -- --  TROPONINI -- -- -- --    Estimated Creatinine Clearance: 28.9 ml/min (by C-G formula based on Cr of 2.67).   Medications:  Infusions:     . heparin 1,400 Units/hr (06/21/11 1136)  . DISCONTD: heparin 1,400 Units/hr (06/21/11 0235)    Assessment: 70 YOM on IV heparin drip @ 1400 units/hr. Today's AM heparin level reported at >2.  Previously therapeutic @0 .44 yesterday on the same heparin rate, thus HL of >2 did not seem accurated. No bleeding reported per RN and no complications with heparin infusion. Ordered STAT heparin level and held heparin drip until result back. This HL was 0.34, thus resumed IV heparin 1400 units/hr for DVT. Now to start Coumadin. Baseline INR 1.13 on 06/11/11.  CBC stable  Day#8 vancomycin/Zosyn for Foot gangrene/necrosis possible sepsis s/p left BKA Wound cx +MRSA 06/14/11 6/8 Blood Cx= NGTD Vancomycin 1gm IV q24h and Zosyn 3.375g IV q8h  Scr 2.67 , CrCl ~ 29 ml/min stable. Afebrile x 24 hrs, WBC trending downward.   Goal of Therapy:  Heparin level 0.3-0.7 units/ml Monitor platelets by anticoagulation protocol: Yes INR 2-3   Plan:    Continue heparin at 1400 units/hr (14 ml/hr). Coumadin 7.5mg  today x1 Daily HL, INR and CBC.  Continue Vancomycin 1gm IV q24h. Plan to check steady state trough 6/11 PM if Vancomycin continued after tomorrow.   Aaron Long, RPh Clinical Pharmacist 06/21/2011,12:14 PM

## 2011-06-21 NOTE — Progress Notes (Signed)
Pt d/c to CIR, reported to [place], Charity fundraiser.  Aaron Long

## 2011-06-21 NOTE — Progress Notes (Signed)
Occupational Therapy Treatment Patient Details Name: Aaron Long MRN: [id number] DOB: [birth year] Today's Date: 06/21/2011 Time: 4098-1191 OT Time Calculation (min): 17 min  OT Assessment / Plan / Recommendation Comments on Treatment Session Pt still having difficulty with balance, leaning heavily to the R when standing and unable to correct with multimodal cues. Con't to recommend inpt rehab.    Follow Up Recommendations  Inpatient Rehab    Barriers to Discharge       Equipment Recommendations  Defer to next venue    Recommendations for Other Services Rehab consult  Frequency Min 2X/week   Plan Discharge plan remains appropriate    Precautions / Restrictions Precautions Precautions: Fall Precaution Comments: L BKA Restrictions Weight Bearing Restrictions: No   Pertinent Vitals/Pain     ADL  Grooming: Performed;Wash/dry face;Set up Where Assessed - Grooming: Supported sitting Toilet Transfer: +2 Total assistance Toilet Transfer: Patient Percentage: 40% Toilet Transfer Method: Stand pivot (max cues for weight shift, hand placement, posture.) ADL Comments: Pt fatigued quickly at end of session. Difficulty maintaining balance and upright posture. Had 1 LOB posteriorly when seated at EOB.    OT Diagnosis:    OT Problem List:   OT Treatment Interventions:     OT Goals ADL Goals ADL Goal: Grooming - Progress: Progressing toward goals ADL Goal: Toilet Transfer - Progress: Not progressing Miscellaneous OT Goals OT Goal: Miscellaneous Goal #1 - Progress: Progressing toward goals  Visit Information  Last OT Received On: 06/14/11 Assistance Needed: +2 PT/OT Co-Evaluation/Treatment: Yes    Subjective Data  Subjective: Its kind of hard to live with one leg when you've had 2 for 70 years!!   Prior Functioning       Cognition  Overall Cognitive Status: Impaired Area of Impairment: Memory;Attention;Safety/judgement Arousal/Alertness: Awake/alert Orientation Level:  Disoriented to;Time Behavior During Session: [facility] for tasks performed Current Attention Level: Sustained Memory Deficits: decreased recall of days, events and of therapy yesterday. Safety/Judgement: Decreased safety judgement for tasks assessed Cognition - Other Comments: Maintains a crouched posture with transfer with decr safety as he leans greatly to left and has difficulty correcting with verbal and manual cues    Mobility Bed Mobility Bed Mobility: Rolling Right;Right Sidelying to Sit;Sitting - Scoot to Edge of Bed Rolling Right: 3: Mod assist Rolling Left: Not tested (comment) Right Sidelying to Sit: 2: Max assist;HOB flat Left Sidelying to Sit: Not tested (comment) Sitting - Scoot to Edge of Bed: 3: Mod assist Details for Bed Mobility Assistance: PAtient needed incr assist to sit to EOB.  Losing balance to his right quite a bit.  Needed incr time to attain full upright sitting position on EOB.  Patient needed max verbal and manual assist and cues to obtain balance.  Max to mod assist to sit up from supine.  Mod assist using pad to scoot to EOB.   Transfers Sit to Stand: 1: +2 Total assist;With upper extremity assist;From bed Sit to Stand: Patient Percentage: 40% Stand to Sit: 1: +2 Total assist;With upper extremity assist;To chair/3-in-1;With armrests Stand to Sit: Patient Percentage: 40% Details for Transfer Assistance: Patient needed cues for hand placement and transfer with incr time to achieve standing in RW and leaning left in standing.  Patient unweighted right foot to hop 2-3 steps forward (wife brought tennis shoe).  Patient maintains a crouched posture throughout standing.  Requires incr assist to stand upright with right knee buckling and not appearing to be able to hold his weight.       Exercises  Balance Balance Balance Assessed: Yes Static Sitting Balance Static Sitting - Balance Support: Feet supported;Left upper extremity supported Static Sitting - Level of  Assistance: 3: Mod assist Static Sitting - Comment/# of Minutes: 3 minutes, PAtient lost balance posteriorly when not having bil UE supported needing mod assist to recover balance.   Static Standing Balance Static Standing - Balance Support: Bilateral upper extremity supported;During functional activity Static Standing - Level of Assistance: 1: +2 Total assist Static Standing - Comment/# of Minutes: patient = 40 %.  Left lean in standing and patient could not correct with cuing.   End of Session OT - End of Session Equipment Utilized During Treatment: Gait belt Activity Tolerance: Patient tolerated treatment well Patient left: in chair;with call bell/phone within reach;with family/visitor present   Aaron Long A OTR/L [phone] 06/21/2011, 10:12 AM

## 2011-06-21 NOTE — H&P (Signed)
Physical Medicine and Rehabilitation Admission H&P    No chief complaint on file. : RUE:AVWUJW Aaron Long is a right handed 71 y.o. male with history of diabetes mellitus , peripheral neuropathy and peripheral vascular disease. Admitted 06/10/2011 with dry gangrene of left fifth toe. Recent aortogram with 50% SFA, occluded popliteal and 1 vessel runoff via posterior tibial artery. Initially underwent amputation of left fifth toe with debridement of left foot 06/14/2011. Progressive ischemic changes noted as well as low-grade fever 102. Underwent left below-knee amputation 06/16/2011 per Dr. Darrick Penna. Placed on subcutaneous heparin for DVT prophylaxis. A limb guard was placed per [employer]. Venous Doppler study on 06/19/2011 showed DVT left popliteal vein. Wound cultures of left foot showed MRSA and placed on contact precautions. His subcutaneous heparin change to IV heparin with plans to initiate Coumadin today. Blood sugars monitored with latest hemoglobin A1c is 7.9. Physical and occupational therapy ongoing. M.D. is requested physical medicine rehabilitation consult to consider inpatient rehabilitation services . Patient was seen by physical medicine rehabilitation consult and plan admit to inpatient rehabilitation services     Review of Systems  Cardiovascular: Positive for leg swelling.  Gastrointestinal: Positive for constipation.  All other systems reviewed and are negative   Past Medical History  Diagnosis Date  . Coronary artery disease   . Prostate cancer 2010  . PAD (peripheral artery disease)   . Gangrene of toe     dry  . Neuromuscular disorder     diabetic neuropathy  . Hypertension     takes Amlodipine daily and Metoprolol bid  . Hyperlipidemia     takes Pravastatin daily  . Myocardial infarction 2005  . Peripheral neuropathy   . Constipation     takes Miralax daily  . Hemorrhoids   . Hx of colonic polyps   . History of kidney stones   . Diabetes mellitus     Novolog  and Levemir daily   Past Surgical History  Procedure Date  . Prostate surgery 2009    prostatectomy  . Knee cartilage surgery at age 17    left knee  . Cea     Right  . Coronary artery bypass graft 2005    x 4  . Carotid endarterectomy 2005    right  . Cataract surgery 2011    bilateral  . Colonoscopy   . Cardiac catheterization 05/28/11  . Amputation 06/14/2011    Procedure: AMPUTATION DIGIT;  Surgeon: Sherren Kerns, MD;  Location: [facility] OR;  Service: Vascular;  Laterality: Left;  Amputation Left fifth toe  . Amputation 06/16/2011    Procedure: AMPUTATION BELOW KNEE;  Surgeon: Sherren Kerns, MD;  Location: [facility] OR;  Service: Vascular;  Laterality: Left;   Family History  Problem Relation Age of Onset  . Coronary artery disease Neg Hx   . Anesthesia problems Neg Hx   . Hypotension Neg Hx   . Malignant hyperthermia Neg Hx   . Pseudochol deficiency Neg Hx    Social History:  reports that he quit smoking about 20 years ago. His smoking use included Cigarettes. He has a 40 pack-year smoking history. He has never used smokeless tobacco. He reports that he does not drink alcohol or use illicit drugs. Allergies: No Known Allergies Medications Prior to Admission  Medication Sig Dispense Refill  . amLODipine (NORVASC) 10 MG tablet Take 10 mg by mouth every morning.       [name] aspirin EC 81 MG tablet Take 81 mg by mouth every morning.       [name]  insulin aspart (NOVOLOG) 100 UNIT/ML injection Inject 2-4 Units into the skin daily. Per sliding scale      . insulin detemir (LEVEMIR) 100 UNIT/ML injection Inject 26 Units into the skin 2 (two) times daily.       . metoprolol tartrate (LOPRESSOR) 25 MG tablet Take 50 mg by mouth 2 (two) times daily.      . polyethylene glycol (MIRALAX / GLYCOLAX) packet Take 17 g by mouth daily.      . pravastatin (PRAVACHOL) 80 MG tablet Take 80 mg by mouth every morning.       . pregabalin (LYRICA) 75 MG capsule Take 75 mg by mouth 2 (two) times daily.         Home:     Functional History:    Functional Status:  Mobility:          ADL:    Cognition:       There were no vitals taken for this visit. Physical Exam  Constitutional: He is oriented to person, place, and time. He appears well-developed.  HENT:  Head: Normocephalic.  Neck: Neck supple. No thyromegaly present.  Cardiovascular: Regular rhythm.  Pulmonary/Chest: Breath sounds normal. He has no wheezes.  Abdominal: He exhibits no distension. There is no tenderness.  Musculoskeletal:  . RLE is intact.  Neurological: He is alert and oriented to person, place, and time. A sensory deficit is present in the R foot.  Motor exam grossly 4/5 UE, 3-4/5 RLE and 2-3 LLE hip flexion and knee flexion.   He is drowsy and falls asleep. Needed additional time for processing, orientation, etc.  Skin:  Left BKA site dog earred stump with 2+ edema mild ecchymosis  Psychiatric: He has a normal mood and affect. His behavior is normal   Results for orders placed during the hospital encounter of 06/14/11 (from the past 48 hour(s))  GLUCOSE, CAPILLARY     Status: Abnormal   Collection Time   06/19/11  4:26 PM      Component Value Range Comment   Glucose-Capillary 216 (*) 70 - 99 (mg/dL)   GLUCOSE, CAPILLARY     Status: Abnormal   Collection Time   06/19/11  9:00 PM      Component Value Range Comment   Glucose-Capillary 165 (*) 70 - 99 (mg/dL)    Comment 1 Notify RN     GLUCOSE, CAPILLARY     Status: Abnormal   Collection Time   06/20/11  4:07 AM      Component Value Range Comment   Glucose-Capillary 69 (*) 70 - 99 (mg/dL)    Comment 1 Notify RN     CBC     Status: Abnormal   Collection Time   06/20/11  6:00 AM      Component Value Range Comment   WBC 15.7 (*) 4.0 - 10.5 (K/uL)    RBC 2.85 (*) 4.22 - 5.81 (MIL/uL)    Hemoglobin 8.1 (*) 13.0 - 17.0 (g/dL)    HCT 16.1 (*) 09.6 - 52.0 (%)    MCV 88.4  78.0 - 100.0 (fL)    MCH 28.4  26.0 - 34.0 (pg)    MCHC 32.1  30.0 - 36.0  (g/dL)    RDW 04.5  40.9 - 81.1 (%)    Platelets 383  150 - 400 (K/uL)   HEPARIN LEVEL (UNFRACTIONATED)     Status: Normal   Collection Time   06/20/11  6:00 AM      Component Value Range Comment  Heparin Unfractionated 0.44  0.30 - 0.70 (IU/mL)   GLUCOSE, CAPILLARY     Status: Abnormal   Collection Time   06/20/11  6:02 AM      Component Value Range Comment   Glucose-Capillary 64 (*) 70 - 99 (mg/dL)    Comment 1 Notify RN     GLUCOSE, CAPILLARY     Status: Abnormal   Collection Time   06/20/11  6:41 AM      Component Value Range Comment   Glucose-Capillary 205 (*) 70 - 99 (mg/dL)    Comment 1 Notify RN     PREPARE RBC (CROSSMATCH)     Status: Normal   Collection Time   06/20/11  7:54 AM      Component Value Range Comment   Order Confirmation ORDER PROCESSED BY BLOOD BANK     GLUCOSE, CAPILLARY     Status: Abnormal   Collection Time   06/20/11 11:26 AM      Component Value Range Comment   Glucose-Capillary 123 (*) 70 - 99 (mg/dL)   GLUCOSE, CAPILLARY     Status: Abnormal   Collection Time   06/20/11  4:29 PM      Component Value Range Comment   Glucose-Capillary 125 (*) 70 - 99 (mg/dL)   GLUCOSE, CAPILLARY     Status: Abnormal   Collection Time   06/20/11  8:30 PM      Component Value Range Comment   Glucose-Capillary 176 (*) 70 - 99 (mg/dL)   GLUCOSE, CAPILLARY     Status: Abnormal   Collection Time   06/21/11  5:55 AM      Component Value Range Comment   Glucose-Capillary 165 (*) 70 - 99 (mg/dL)    Comment 1 Notify RN     HEPARIN LEVEL (UNFRACTIONATED)     Status: Abnormal   Collection Time   06/21/11  6:30 AM      Component Value Range Comment   Heparin Unfractionated >2.00 (*) 0.30 - 0.70 (IU/mL)   CBC     Status: Abnormal   Collection Time   06/21/11  6:30 AM      Component Value Range Comment   WBC 13.7 (*) 4.0 - 10.5 (K/uL)    RBC 3.37 (*) 4.22 - 5.81 (MIL/uL)    Hemoglobin 9.7 (*) 13.0 - 17.0 (g/dL)    HCT 16.1 (*) 09.6 - 52.0 (%)    MCV 89.0  78.0 - 100.0 (fL)     MCH 28.8  26.0 - 34.0 (pg)    MCHC 32.3  30.0 - 36.0 (g/dL)    RDW 04.5  40.9 - 81.1 (%)    Platelets 413 (*) 150 - 400 (K/uL)   HEPARIN LEVEL (UNFRACTIONATED)     Status: Normal   Collection Time   06/21/11  9:40 AM      Component Value Range Comment   Heparin Unfractionated 0.34  0.30 - 0.70 (IU/mL)   GLUCOSE, CAPILLARY     Status: Abnormal   Collection Time   06/21/11 11:12 AM      Component Value Range Comment   Glucose-Capillary 152 (*) 70 - 99 (mg/dL)    Comment 1 Documented in Chart      Comment 2 Notify RN      No results found.  Post Admission Physician Evaluation: 1. Functional deficits secondary  to left below-knee amputation. 2. Patient is admitted to receive collaborative, interdisciplinary care between the physiatrist, rehab nursing staff, and therapy team. 3. Patient's level of medical  complexity and substantial therapy needs in context of that medical necessity cannot be provided at a lesser intensity of care such as a SNF. 4. Patient has experienced substantial functional loss from his/her baseline which was documented above under the "Functional History" and "Functional Status" headings.  Judging by the patient's diagnosis, physical exam, and functional history, the patient has potential for functional progress which will result in measurable gains while on inpatient rehab.  These gains will be of substantial and practical use upon discharge  in facilitating mobility and self-care at the household level. 5. Physiatrist will provide 24 hour management of medical needs as well as oversight of the therapy plan/treatment and provide guidance as appropriate regarding the interaction of the two. 6. 24 hour rehab nursing will assist with bladder management, bowel management, safety, skin/wound care, disease management, medication administration, pain management and patient education  and help integrate therapy concepts, techniques,education, etc. 7. PT will assess and treat for:   Pre-gait training gait training and endurance safety stump wrapping care.  Goals are: modified independent with mobility at wheelchair level. 8. OT will assess and treat for: ADLs, upper extremity strengthening, safety, endurance, he quit.   Goals are: modified independent ADLs at wheelchair level. 9. SLP will assess and treat for: not applicable.  Goals are: not applicable. 10. Case Management and Social Worker will assess and treat for psychological issues and discharge planning. 11. Team conference will be held weekly to assess progress toward goals and to determine barriers to discharge. 12. Patient will receive at least 3 hours of therapy per day at least 5 days per week. 13. ELOS: 7-10 days      Prognosis:  good   Medical Problem List and Plan: 1. left BKA secondary to PVD 06/16/2011 2. DVT Prophylaxis/Anticoagulation: DVT left popliteal vein. IV heparin/ Coumadin. Discontinue heparin once INR greater than 2.00. Monitor for any bleeding episodes 3. Pain Management:  oxycodone as needed, Lyrica 75 mg twice daily has phantom limb pain as well as postop pain 4. Wound culture positive MRSA. Contact precautions 5. Diabetes mellitus with neuropathy. Hemoglobin A1c 7.9. Levemir 26 units twice a day. Check CBGs a.c. and at bedtime 6. Hypertension. Norvasc 10 mg daily. Monitor with increased mobility 7. Hyperlipidemia. Zocor   06/21/2011, 3:53 PM

## 2011-06-21 NOTE — PMR Pre-admission (Signed)
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 Date:       Deduct:        Out of Pocket Max:        Life Max:   CIR:        SNF:   Outpatient:       Co-Pay:   Home Health:        Co-Pay:   DME:       Co-Pay:    Emergency Contact Information Contact Information    Name Relation Home Work Mobile   Aaron Long Spouse [phone]  [phone]   Aaron Long [phone]       Current Medical History  Patient Admitting Diagnosis: L BKA  History of Present Illness:  71 y.o. male with history of diabetes mellitus , peripheral neuropathy and peripheral vascular disease. Admitted 06/10/2011 with dry gangrene of left fifth toe. Recent aortogram with 50% SFA, occluded popliteal and 1 vessel runoff via posterior tibial artery. Initially underwent amputation of left fifth toe with debridement of left foot 06/14/2011. Progressive ischemic changes noted as well as low-grade fever 102. Underwent left below-knee amputation 06/16/2011 per Dr. Darrick Penna. Placed on subcutaneous heparin for DVT prophylaxis.  Blood sugars monitored with latest hemoglobin A1c is 7.9. WBC now trending down and no fever at present.  Developed left popliteal artery DVT and now on heparin drip.  Participating with therapy.  Past Medical History  Past Medical History  Diagnosis Date  . Coronary artery disease   . Prostate cancer 2010  . PAD (peripheral artery disease)   . Gangrene of toe     dry  . Neuromuscular disorder     diabetic neuropathy  . Hypertension     takes Amlodipine daily and Metoprolol bid  . Hyperlipidemia     takes Pravastatin daily  . Myocardial infarction 2005  . Peripheral neuropathy   . Constipation     takes Miralax daily  . Hemorrhoids   . Hx of colonic polyps   .  History of kidney stones   . Diabetes mellitus     Novolog and Levemir daily    Family History  family history is negative for Coronary artery disease, and Anesthesia problems, and Hypotension, and Malignant hyperthermia, and Pseudochol deficiency, .  Prior Rehab/Hospitalizations:  Aaron Long to cardiac rehab about 6 yrs ago.  Current Medications  Current facility-administered medications:acetaminophen (TYLENOL) suppository 325-650 mg, 325-650 mg, Rectal, Q4H PRN, Ames Coupe Rhyne, PA;  acetaminophen (TYLENOL) tablet 325-650 mg, 325-650 mg, Oral, Q4H PRN, Dara Lords, PA, 650 mg at 06/20/11 2121;  alum & mag hydroxide-simeth (MAALOX/MYLANTA) 200-200-20 MG/5ML suspension 15-30 mL, 15-30 mL, Oral, Q2H PRN, Ames Coupe Rhyne, PA amLODipine (NORVASC) tablet 10 mg, 10 mg, Oral, q morning - 10a, Samantha J Rhyne, PA, 10 mg at 06/21/11 1139;  aspirin EC tablet 81 mg, 81 mg, Oral, q morning - 10a, Samantha J Rhyne, PA, 81 mg at 06/21/11 1138;  docusate sodium (COLACE) capsule 100 mg, 100 mg, Oral, Daily, Samantha J Rhyne, PA, 100 mg at 06/21/11 1138;  guaiFENesin-dextromethorphan (ROBITUSSIN DM) 100-10 MG/5ML syrup 15 mL, 15 mL, Oral, Q4H PRN, Samantha J Rhyne, PA heparin ADULT infusion 100 units/mL (25000 units/250 mL), 1,400  Units/hr, Intravenous, Continuous, Sherren Kerns, MD, Last Rate: 14 mL/hr at 06/21/11 1136, 1,400 Units/hr at 06/21/11 1136;  hydrALAZINE (APRESOLINE) injection 10 mg, 10 mg, Intravenous, Q2H PRN, Ames Coupe Rhyne, PA, 10 mg at 06/21/11 0647;  insulin aspart (novoLOG) injection 0-20 Units, 0-20 Units, Subcutaneous, TID WC, Sherren Kerns, MD, 4 Units at 06/21/11 1140 insulin detemir (LEVEMIR) injection 26 Units, 26 Units, Subcutaneous, BID, Ames Coupe Rhyne, PA, 26 Units at 06/21/11 1141;  labetalol (NORMODYNE,TRANDATE) injection 10 mg, 10 mg, Intravenous, Q2H PRN, Ames Coupe Rhyne, PA;  metoprolol (LOPRESSOR) injection 2-5 mg, 2-5 mg, Intravenous, Q2H PRN, Ames Coupe Rhyne, PA, 2.5 mg at 06/16/11 [phone];  morphine 2 MG/ML injection 2-5 mg, 2-5 mg, Intravenous, Q1H PRN, Ames Coupe Rhyne, PA, 2 mg at 06/16/11 1642 morphine 2 MG/ML injection 2-5 mg, 2-5 mg, Intravenous, Q1H PRN, Ames Coupe Rhyne, PA, 4 mg at 06/17/11 0130;  ondansetron (ZOFRAN) injection 4 mg, 4 mg, Intravenous, Q6H PRN, Samantha J Rhyne, PA;  oxyCODONE (Oxy IR/ROXICODONE) immediate release tablet 5-10 mg, 5-10 mg, Oral, Q4H PRN, Ames Coupe Rhyne, PA, 10 mg at 06/21/11 0744;  pantoprazole (PROTONIX) EC tablet 40 mg, 40 mg, Oral, Q1200, Samantha J Rhyne, PA, 40 mg at 06/21/11 1139 phenol (CHLORASEPTIC) mouth spray 1 spray, 1 spray, Mouth/Throat, PRN, Samantha J Rhyne, PA;  piperacillin-tazobactam (ZOSYN) IVPB 3.375 g, 3.375 g, Intravenous, Q8H, Sherren Kerns, MD, 3.375 g at 06/21/11 1140;  polyethylene glycol (MIRALAX / GLYCOLAX) packet 17 g, 17 g, Oral, Daily, Samantha J Rhyne, PA, 17 g at 06/21/11 1139;  pregabalin (LYRICA) 75 mg, 75 mg, Oral, BID, Samantha J Rhyne, PA, 75 mg at 06/21/11 1139 simvastatin (ZOCOR) tablet 5 mg, 5 mg, Oral, q1800, Ames Coupe Rhyne, PA, 5 mg at 06/20/11 1809;  temazepam (RESTORIL) capsule 15 mg, 15 mg, Oral, QHS PRN,MR X 1, Samantha J Rhyne, PA;  vancomycin (VANCOCIN) IVPB 1000 mg/200 mL premix, 1,000 mg,  Intravenous, Q24H, Severiano Gilbert, PHARMD, 1,000 mg at 06/20/11 1702;  zolpidem (AMBIEN) tablet 5 mg, 5 mg, Oral, QHS PRN, Sherren Kerns, MD DISCONTD: heparin ADULT infusion 100 units/mL (25000 units/250 mL), 1,400 Units/hr, Intravenous, Continuous, Fayne Norrie, PHARMD, Last Rate: 14 mL/hr at 06/21/11 0235, 1,400 Units/hr at 06/21/11 0235;  DISCONTD: vancomycin (VANCOCIN) IVPB 1000 mg/200  mL premix, 1,000 mg, Intravenous, Q24H, Dannielle Karvonen Kibler, PHARMD, 1,000 mg at 06/19/11 1232  Patients Current Diet: Carb Control  Precautions / Restrictions Precautions Precautions: Fall Precaution Comments: L BKA Restrictions Weight Bearing Restrictions: No   Prior Activity Level Household: Goes out of condo about 2 X a month.  Home Assistive Devices / Equipment Home Assistive Devices/Equipment: Cane (specify quad or straight);Eyeglasses;CBG Meter;Shower chair with back;Other (Comment) (straight cane) Home Adaptive Equipment: Shower chair with back;Walker - standard;Straight cane;Bedside commode/3-in-1  Prior Functional Level Prior Function Level of Independence: Needs assistance Needs Assistance: Bathing;Dressing;Toileting;Meal Prep;Light Housekeeping Bath: Moderate Dressing: Moderate Toileting: Moderate Meal Prep: Total Light Housekeeping: Total Driving: No Vocation: Retired Comments: wife has been assisting the last 6 months  Current Functional Level Cognition  Arousal/Alertness: Awake/alert Overall Cognitive Status: Impaired Current Attention Level: Sustained Memory Deficits: decreased recall of days, events and of therapy yesterday. Orientation Level: Oriented X4 Safety/Judgement: Decreased safety judgement for tasks assessed Cognition - Other Comments: Maintains a crouched posture with transfer with decr safety as he leans greatly to left and has difficulty correcting with verbal and manual cues    Extremity Assessment (includes Sensation/Coordination)  RUE  ROM/Strength/Tone: Within functional levels  RLE ROM/Strength/Tone: [facility] for tasks assessed    ADLs  Grooming: Performed;Wash/dry face;Set up Where Assessed - Grooming: Supported sitting Upper Body Bathing: Simulated;Minimal assistance Where Assessed - Upper Body Bathing: Supported sitting Lower Body Bathing: Simulated;+2 Total assistance (Pt needs to keep B hands on RW when standing.) Lower Body Bathing: Patient Percentage: 0% Where Assessed - Lower Body Bathing: Supported sit to stand Upper Body Dressing: Simulated;Minimal assistance Where Assessed - Upper Body Dressing: Supported sitting Lower Body Dressing: Simulated;+2 Total assistance (Pt needs to keep B hands on RW when standing.) Lower Body Dressing: Patient Percentage: 0% Where Assessed - Lower Body Dressing: Sopported sit to stand Toilet Transfer: +2 Total assistance Toilet Transfer: Patient Percentage: 40% Toilet Transfer Method: Stand pivot (max cues for weight shift, hand placement, posture.) Toilet Transfer Equipment: Other (comment) (to recliner) Toileting - Clothing Manipulation and Hygiene: Simulated;+2 Total assistance (Pt needs to keep B hands on RW when standing.) Toileting - Clothing Manipulation and Hygiene: Patient Percentage: 0% Where Assessed - Toileting Clothing Manipulation and Hygiene: Standing ADL Comments: Pt fatigued quickly at end of session. Difficulty maintaining balance and upright posture. Had 1 LOB posteriorly when seated at EOB.    Mobility  Bed Mobility: Rolling Right;Right Sidelying to Sit;Sitting - Scoot to [place] of Bed Rolling Right: 3: Mod assist Rolling Left: Not tested (comment) Right Sidelying to Sit: 2: Max assist;HOB flat Left Sidelying to Sit: Not tested (comment) Sitting - Scoot to Edge of Bed: 3: Mod assist    Transfers  Transfers: Sit to Stand;Stand to Sit Sit to Stand: 1: +2 Total assist;With upper extremity assist;From bed Sit to Stand: Patient Percentage: 40% Stand to Sit: 1:  +2 Total assist;With upper extremity assist;To chair/3-in-1;With armrests Stand to Sit: Patient Percentage: 40% Stand Pivot Transfers: Not tested (comment) Stand Pivot Transfers: Patient Percentage: 30%    Ambulation / Gait / Stairs / Wheelchair Mobility  Ambulation/Gait Ambulation/Gait Assistance: 1: +2 Total assist Ambulation/Gait: Patient Percentage: 40% Ambulation Distance (Feet): 3 Feet Assistive device: Rolling walker Ambulation/Gait Assistance Details: Patient took 2-3 steps with cues for sequencing steps and RW.  Needed help to move RW and pulled chair up behind patient.   Gait Pattern: Step-to pattern;Decreased step length - right;Trunk flexed Stairs: No Wheelchair Mobility Wheelchair Mobility: No    Posture /  Balance Static Sitting Balance Static Sitting - Balance Support: Feet supported;Left upper extremity supported Static Sitting - Level of Assistance: 3: Mod assist Static Sitting - Comment/# of Minutes: 3 minutes, PAtient lost balance posteriorly when not having bil UE supported needing mod assist to recover balance.   Static Standing Balance Static Standing - Balance Support: Bilateral upper extremity supported;During functional activity Static Standing - Level of Assistance: 1: +2 Total assist Static Standing - Comment/# of Minutes: patient = 40 %.  Left lean in standing and patient could not correct with cuing.      Previous Home Environment Living Arrangements: Spouse/significant other Lives With: Spouse Available Help at Discharge: Family;Available PRN/intermittently Type of Home: Apartment Home Layout: One level Home Access: Level entry Bathroom Shower/Tub: Walk-in shower;Door [employer] Toilet: Standard Home Care Services: No  Discharge Living Setting Plans for Discharge Living Setting: Patient's home;Other (Comment) (Lives with wife and son.) Type of Home at Discharge: Apartment (Lives in a Condominium.) Discharge Home Layout: Two level (Can put bed in LR  on main level, 1/2 bath main level.) Discharge Home Access: Stairs to enter Entrance Stairs-Number of Steps: 1 Do you have any problems obtaining your medications?: No  Social/Family/Support Systems Patient Roles: Spouse;Parent Contact Information: Yosgart Pavey - wife (h) [phone] Anticipated Caregiver: wife and son Ability/Limitations of Caregiver: Wife retired and can assist.  Son works evenings Caregiver Availability: 24/7 Discharge Plan Discussed with Primary Caregiver: Yes Is Caregiver In Agreement with Plan?: Yes Does Caregiver/Family have Issues with Lodging/Transportation while Pt is in Rehab?: No  Goals/Additional Needs Patient/Family Goal for Rehab: PT/OT min A/S goals Expected length of stay: 1-2 weeks Cultural Considerations: None Dietary Needs: Carb Mod Med Calorie, thin liquids diet Equipment Needs: TBD Pt/Family Agrees to Admission and willing to participate: Yes Program Orientation Provided & Reviewed with Pt/Caregiver Including Roles  & Responsibilities: Yes  Patient Condition: Please see physician update to information in consult dated 06/17/11.  Preadmission Screen Completed By:  Trish Mage, 06/21/2011 12:02 PM ______________________________________________________________________   Discussed status with Dr. Wynn Banker on 06/21/11 at 1203 and received telephone approval for admission today.  Admission Coordinator:  Trish Mage, time1204/Date06/10/13

## 2011-06-21 NOTE — Progress Notes (Signed)
Physical Therapy Treatment Patient Details Name: Aaron Long MRN: [id number] DOB: 1940/12/16 Today's Date: 06/21/2011 Time: 4098-1191 PT Time Calculation (min): 18 min  PT Assessment / Plan / Recommendation Comments on Treatment Session  Patient s/p L BKA with decr mobility secondary to right LE weakness and poor postural stability.  Needs continued PT to address balance and endurance issues.  Needs Rehab.    Follow Up Recommendations  Inpatient Rehab    Barriers to Discharge  None      Equipment Recommendations  Defer to next venue    Recommendations for Other Services Rehab consult  Frequency Min 4X/week   Plan Discharge plan remains appropriate;Frequency remains appropriate    Precautions / Restrictions Precautions Precautions: Fall Precaution Comments: L BKA Restrictions Weight Bearing Restrictions: No   Pertinent Vitals/Pain VSS/Some pain    Mobility  Bed Mobility Bed Mobility: Rolling Right;Right Sidelying to Sit;Sitting - Scoot to Edge of Bed Rolling Right: 3: Mod assist Rolling Left: Not tested (comment) Right Sidelying to Sit: 2: Max assist;HOB flat Left Sidelying to Sit: Not tested (comment) Sitting - Scoot to Edge of Bed: 3: Mod assist Details for Bed Mobility Assistance: PAtient needed incr assist to sit to EOB.  Losing balance to his right quite a bit.  Needed incr time to attain full upright sitting position on EOB.  Patient needed max verbal and manual assist and cues to obtain balance.  Max to mod assist to sit up from supine.  Mod assist using pad to scoot to EOB.   Transfers Transfers: Sit to Stand;Stand to Sit Sit to Stand: 1: +2 Total assist;With upper extremity assist;From bed Sit to Stand: Patient Percentage: 40% Stand to Sit: 1: +2 Total assist;With upper extremity assist;To chair/3-in-1;With armrests Stand to Sit: Patient Percentage: 40% Stand Pivot Transfers: Not tested (comment) Details for Transfer Assistance: Patient needed cues for hand  placement and transfer with incr time to achieve standing in RW and leaning left in standing.  Patient unweighted right foot to hop 2-3 steps forward (wife brought tennis shoe).  Patient maintains a crouched posture throughout standing.  Requires incr assist to stand upright with right knee buckling and not appearing to be able to hold his weight.     Ambulation/Gait Ambulation/Gait Assistance: 1: +2 Total assist Ambulation/Gait: Patient Percentage: 40% Ambulation Distance (Feet): 3 Feet Assistive device: Rolling walker Ambulation/Gait Assistance Details: Patient took 2-3 steps with cues for sequencing steps and RW.  Needed help to move RW and pulled chair up behind patient.   Gait Pattern: Step-to pattern;Decreased step length - right;Trunk flexed Stairs: No Wheelchair Mobility Wheelchair Mobility: No    Exercises Total Joint Exercises Quad Sets: AROM;5 reps;Seated    PT Goals Acute Rehab PT Goals PT Goal: Supine/Side to Sit - Progress: Progressing toward goal PT Goal: Sit to Stand - Progress: Progressing toward goal PT Goal: Stand to Sit - Progress: Progressing toward goal PT Transfer Goal: Bed to Chair/Chair to Bed - Progress: Progressing toward goal  Visit Information  Last PT Received On: 06/21/11 Assistance Needed: +2 PT/OT Co-Evaluation/Treatment: Yes    Subjective Data  Subjective: "I just have so much trouble standing up."   Cognition  Overall Cognitive Status: Impaired Area of Impairment: Memory;Attention;Safety/judgement Arousal/Alertness: Awake/alert Orientation Level: Disoriented to;Time Behavior During Session: WFL for tasks performed Current Attention Level: Sustained Safety/Judgement: Decreased safety judgement for tasks assessed Cognition - Other Comments: Maintains a crouched posture with transfer with decr safety as he leans greatly to left and has difficulty correcting  with verbal and manual cues    Balance  Balance Balance Assessed: Yes Static Sitting  Balance Static Sitting - Balance Support: Feet supported;Left upper extremity supported Static Sitting - Level of Assistance: 3: Mod assist Static Sitting - Comment/# of Minutes: 3 minutes, PAtient lost balance posteriorly when not having bil UE supported needing mod assist to recover balance.   Static Standing Balance Static Standing - Balance Support: Bilateral upper extremity supported;During functional activity Static Standing - Level of Assistance: 1: +2 Total assist Static Standing - Comment/# of Minutes: patient = 40 %.  Left lean in standing and patient could not correct with cuing.   End of Session PT - End of Session Equipment Utilized During Treatment: Gait belt Activity Tolerance: Patient limited by pain Patient left: in chair;with call bell/phone within reach;with family/visitor present Nurse Communication: Mobility status    INGOLD,Tyechia Allmendinger 06/21/2011, 9:54 AM  [facility] [phone] (pager)

## 2011-06-22 DIAGNOSIS — I739 Peripheral vascular disease, unspecified: Secondary | ICD-10-CM

## 2011-06-22 DIAGNOSIS — L98499 Non-pressure chronic ulcer of skin of other sites with unspecified severity: Secondary | ICD-10-CM

## 2011-06-22 DIAGNOSIS — Z5189 Encounter for other specified aftercare: Secondary | ICD-10-CM

## 2011-06-22 DIAGNOSIS — S78119A Complete traumatic amputation at level between unspecified hip and knee, initial encounter: Secondary | ICD-10-CM

## 2011-06-22 LAB — COMPREHENSIVE METABOLIC PANEL
CO2: 24 mEq/L (ref 19–32)
Calcium: 9.5 mg/dL (ref 8.4–10.5)
Creatinine, Ser: 2.53 mg/dL — ABNORMAL HIGH (ref 0.50–1.35)
GFR calc Af Amer: 28 mL/min — ABNORMAL LOW (ref >90)
GFR calc non Af Amer: 24 mL/min — ABNORMAL LOW (ref >90)
Glucose, Bld: 63 mg/dL — ABNORMAL LOW (ref 70–99)

## 2011-06-22 LAB — URINE MICROSCOPIC-ADD ON

## 2011-06-22 LAB — URINALYSIS, ROUTINE W REFLEX MICROSCOPIC
Bilirubin Urine: NEGATIVE
Protein, ur: >300 mg/dL — AB
Urobilinogen, UA: 1 mg/dL (ref 0.0–1.0)

## 2011-06-22 LAB — URINE CULTURE

## 2011-06-22 LAB — DIFFERENTIAL
Basophils Absolute: 0 10*3/uL (ref 0.0–0.1)
Lymphocytes Relative: 15 % (ref 12–46)
Neutro Abs: 10.3 10*3/uL — ABNORMAL HIGH (ref 1.7–7.7)
Neutrophils Relative %: 75 % (ref 43–77)

## 2011-06-22 LAB — GLUCOSE, CAPILLARY
Glucose-Capillary: 66 mg/dL — ABNORMAL LOW (ref 70–99)
Glucose-Capillary: 68 mg/dL — ABNORMAL LOW (ref 70–99)
Glucose-Capillary: 109 mg/dL — ABNORMAL HIGH (ref 70–99)

## 2011-06-22 LAB — PROTIME-INR: Prothrombin Time: 14.6 seconds (ref 11.6–15.2)

## 2011-06-22 LAB — CBC
MCH: 28.7 pg (ref 26.0–34.0)
Platelets: 495 10*3/uL — ABNORMAL HIGH (ref 150–400)
RBC: 3.31 MIL/uL — ABNORMAL LOW (ref 4.22–5.81)
WBC: 13.6 10*3/uL — ABNORMAL HIGH (ref 4.0–10.5)

## 2011-06-22 MED ORDER — COUMADIN BOOK
Freq: Once | Status: AC
  Administered 2011-06-22: 12:00:00
  Filled 2011-06-22: qty 1

## 2011-06-22 MED ORDER — WARFARIN VIDEO
Freq: Once | Status: AC
  Administered 2011-06-22: 12:00:00

## 2011-06-22 MED ORDER — WARFARIN SODIUM 7.5 MG PO TABS
7.5000 mg | ORAL_TABLET | Freq: Once | ORAL | Status: AC
  Administered 2011-06-22: 7.5 mg via ORAL
  Filled 2011-06-22: qty 1

## 2011-06-22 MED ORDER — INSULIN DETEMIR 100 UNIT/ML ~~LOC~~ SOLN
23.0000 [IU] | Freq: Two times a day (BID) | SUBCUTANEOUS | Status: DC
  Administered 2011-06-22 – 2011-06-24 (×6): 23 [IU] via SUBCUTANEOUS

## 2011-06-22 NOTE — Care Management (Signed)
Inpatient Rehabilitation Center Individual Statement of Services  Patient Name:  Aaron Long  Date:  06/22/2011  Welcome to the [facility]patient [facility].  Our goal is to provide you with an individualized program based on your diagnosis and situation, designed to meet your specific needs.  With this comprehensive rehabilitation program, you will be expected to participate in at least 3 hours of rehabilitation therapies Monday-Friday, with modified therapy programming on the weekends.  Your rehabilitation program will include the following services:  Physical Therapy (PT), Occupational Therapy (OT), 24 hour per day rehabilitation nursing, Therapeutic Recreaction (TR), Case Management (RN and Child psychotherapist), Rehabilitation Medicine, Nutrition Services and Pharmacy Services  Weekly team conferences will be held on  Tuesday  to discuss your progress.  Your RN Case Designer, television/film set will talk with you frequently to get your input and to update you on team discussions.  Team conferences with you and your family in attendance may also be held.  Expected length of stay: 06/30/11  Overall anticipated outcome: Modified Independent w/c level goals.    Depending on your progress and recovery, your program may change.  Your RN Case Estate agent will coordinate services and will keep you informed of any changes.  Your RN Sports coach and SW names and contact numbers are listed  below.  The following services may also be recommended but are not provided by the Inpatient Rehabilitation Center:   Driving Evaluations  Home Health Rehabiltiation Services  Outpatient Rehabilitatation [facility]  Vocational Rehabilitation   Arrangements will be made to provide these services after discharge if needed.  Arrangements include referral to agencies that provide these services.  Your insurance has been verified to be:  Medicare + [employer]BS of [place] Your primary doctor is:  Dr. William Dalton  Pertinent information will be shared with your doctor and your insurance company.  Case Manager: Melanee Spry, [facility] [phone]  Social Worker:  [place], [place] [phone]  Information discussed with and copy given to patient by: [name], 06/22/2011, 4:51 PM

## 2011-06-22 NOTE — Plan of Care (Signed)
Problem: RH BOWEL ELIMINATION Goal: RH STG MANAGE BOWEL WITH ASSISTANCE STG Manage Bowel with Assistance. Mod I  Outcome: Progressing Continent on toilet

## 2011-06-22 NOTE — Progress Notes (Signed)
Patient ID: Aaron Long, male   DOB: 08-13-1940, 71 y.o.   MRN: [id number] Subjective/Complaints: Aaron Long is a right handed 71 y.o. male with history of diabetes mellitus , peripheral neuropathy and peripheral vascular disease. Admitted 06/10/2011 with dry gangrene of left fifth toe. Recent aortogram with 50% SFA, occluded popliteal and 1 vessel runoff via posterior tibial artery. Initially underwent amputation of left fifth toe with debridement of left foot 06/14/2011. Progressive ischemic changes noted as well as low-grade fever 102. Underwent left below-knee amputation 06/16/2011 per Dr. Darrick Penna. Placed on subcutaneous heparin for DVT prophylaxis. A limb guard was placed per [employer]. Venous Doppler study on 06/19/2011 showed DVT left popliteal vein Slept well Review of Systems  Genitourinary: Positive for urgency.  Musculoskeletal: Positive for joint pain.  Neurological: Positive for sensory change.  All other systems reviewed and are negative.    Objective: Vital Signs: Blood pressure 152/69, pulse 72, temperature 98.6 F (37 C), temperature source Oral, resp. rate 17, weight 98.6 kg (217 lb 6 oz), SpO2 95.00%. No results found. Results for orders placed during the hospital encounter of 06/21/11 (from the past 72 hour(s))  GLUCOSE, CAPILLARY     Status: Abnormal   Collection Time   06/21/11  4:47 PM      Component Value Range Comment   Glucose-Capillary 178 (*) 70 - 99 (mg/dL)   GLUCOSE, CAPILLARY     Status: Abnormal   Collection Time   06/21/11  7:50 PM      Component Value Range Comment   Glucose-Capillary 127 (*) 70 - 99 (mg/dL)   URINALYSIS, ROUTINE W REFLEX MICROSCOPIC     Status: Abnormal   Collection Time   06/21/11  8:36 PM      Component Value Range Comment   Color, Urine YELLOW  YELLOW     APPearance CLEAR  CLEAR     Specific Gravity, Urine 1.012  1.005 - 1.030     pH 6.0  5.0 - 8.0     Glucose, UA 100 (*) NEGATIVE (mg/dL)    Hgb urine dipstick SMALL (*)  NEGATIVE     Bilirubin Urine NEGATIVE  NEGATIVE     Ketones, ur NEGATIVE  NEGATIVE (mg/dL)    Protein, ur 782 (*) NEGATIVE (mg/dL)    Urobilinogen, UA 1.0  0.0 - 1.0 (mg/dL)    Nitrite NEGATIVE  NEGATIVE     Leukocytes, UA NEGATIVE  NEGATIVE    URINE MICROSCOPIC-ADD ON     Status: Normal   Collection Time   06/21/11  8:36 PM      Component Value Range Comment   WBC, UA 0-2  <3 (WBC/hpf)    RBC / HPF 0-2  <3 (RBC/hpf)   DIFFERENTIAL     Status: Abnormal   Collection Time   06/22/11  5:55 AM      Component Value Range Comment   Neutrophils Relative 75  43 - 77 (%)    Neutro Abs 10.3 (*) 1.7 - 7.7 (K/uL)    Lymphocytes Relative 15  12 - 46 (%)    Lymphs Abs 2.1  0.7 - 4.0 (K/uL)    Monocytes Relative 7  3 - 12 (%)    Monocytes Absolute 0.9  0.1 - 1.0 (K/uL)    Eosinophils Relative 3  0 - 5 (%)    Eosinophils Absolute 0.3  0.0 - 0.7 (K/uL)    Basophils Relative 0  0 - 1 (%)    Basophils Absolute 0.0  0.0 -  0.1 (K/uL)   PROTIME-INR     Status: Normal   Collection Time   06/22/11  5:55 AM      Component Value Range Comment   Prothrombin Time 14.6  11.6 - 15.2 (seconds)    INR 1.12  0.00 - 1.49    CBC     Status: Abnormal   Collection Time   06/22/11  5:55 AM      Component Value Range Comment   WBC 13.6 (*) 4.0 - 10.5 (K/uL)    RBC 3.31 (*) 4.22 - 5.81 (MIL/uL)    Hemoglobin 9.5 (*) 13.0 - 17.0 (g/dL)    HCT 16.1 (*) 09.6 - 52.0 (%)    MCV 89.7  78.0 - 100.0 (fL)    MCH 28.7  26.0 - 34.0 (pg)    MCHC 32.0  30.0 - 36.0 (g/dL)    RDW 04.5  40.9 - 81.1 (%)    Platelets 495 (*) 150 - 400 (K/uL)      HEENT: normal Cardio: RRR Resp: CTA B/L GI: BS positive Extremity:  Edema L stumpt distal Skin:   Other L stump staples intact Neuro: Alert/Oriented and Abnormal Sensory reduced lt touch R foot Musc/Skel:  Other L BKA   Assessment/Plan: 1. Functional deficits secondary to L BKA due to PVD which require 3+ hours per day of interdisciplinary therapy in a comprehensive inpatient  rehab setting. Physiatrist is providing close team supervision and 24 hour management of active medical problems listed below. Physiatrist and rehab team continue to assess barriers to discharge/monitor patient progress toward functional and medical goals. FIM:       FIM - Toileting Toileting steps completed by patient: Adjust clothing prior to toileting Toileting Assistive Devices: Grab bar or rail for support Toileting: 4: Steadying assist           Comprehension Comprehension Mode: Auditory Comprehension: 5-Understands complex 90% of the time/Cues < 10% of the time  Expression Expression Mode: Verbal Expression: 5-Expresses complex 90% of the time/cues < 10% of the time  Social Interaction Social Interaction: 6-Interacts appropriately with others with medication or extra time (anti-anxiety, antidepressant).  Problem Solving Problem Solving: 5-Solves complex 90% of the time/cues < 10% of the time  Memory Memory: 5-Recognizes or recalls 90% of the time/requires cueing < 10% of the time  Medical Problem List and Plan:  1. left BKA secondary to PVD 06/16/2011  2. DVT Prophylaxis/Anticoagulation: DVT left popliteal vein. IV heparin/ Coumadin. Discontinue heparin once INR greater than 2.00. Monitor for any bleeding episodes  3. Pain Management: oxycodone as needed, Lyrica 75 mg twice daily has phantom limb pain as well as postop pain  4. Wound culture positive MRSA. Contact precautions  5. Diabetes mellitus with neuropathy. Hemoglobin A1c 7.9. Levemir 26 units twice a day. Check CBGs a.c. and at bedtime  6. Hypertension. Norvasc 10 mg daily. Monitor with increased mobility  7. Hyperlipidemia. Zocor    LOS (Days) 1 A FACE TO FACE EVALUATION WAS PERFORMED  Tyreese Thain E 06/22/2011, 7:15 AM

## 2011-06-22 NOTE — Evaluation (Signed)
Reviewed and in agreement with treatment provided.  

## 2011-06-22 NOTE — Progress Notes (Signed)
Physical Therapy Session Note  Patient Details  Name: Aaron Long MRN: [id number] Date of Birth: 11/22/1940  Today's Date: 06/22/2011 Time: 1430-1500 Time Calculation (min): 30 min  Short Term Goals: Week 1:  PT Short Term Goal 1 (Week 1): =LTG  Skilled Therapeutic Interventions/Progress Updates:  Pain: 8-9/10 Lt. Residual limb pain "soreness" from being up all day. Called RN for medication, returned pt to bed at end of session.   Pt educated on risk for knee contracture, importance of proper positioning (handout provided), importance of maintaining bil. hip ROM, bil. LE skin checks at least 1-2 x daily, residual limb wrapping technique and shaping (handout provided), and importance of quad/glute strength to promote optimal transition to gait with a prosthesis. Pt verbalized understanding.     Pt given BKA basic exercises (handout provided), practiced each 2 x 10 reps. Verbal cues for technique and positioning. Per OT pt having difficulty crossing Rt. LE over Lt. to don shoe/sock secondary to Rt hip tightness.  Performed Rt. Hip Posterior capsule stretch and grade III mobilizations. Also passively stretched pt into external rotation. Pt able to better cross Lt over Rt. LE. Performed squat pivot transfers x 2 bed <> chair with moderate assist, bed mobility at overall min assist.   Therapy Documentation Precautions:  Precautions Precautions: Fall Precaution Comments: L BKA Required Braces or Orthoses: Other Brace/Splint Other Brace/Splint: limbguard Restrictions Weight Bearing Restrictions: No  See FIM for current functional status  Therapy/Group: Individual Therapy  Wilhemina Bonito 06/22/2011, 3:29 PM

## 2011-06-22 NOTE — Progress Notes (Signed)
Patient information reviewed and entered into [facility] system by Kendryck Lacroix, RN, CRRN, PPS Coordinator.  Information including medical coding and functional independence measure will be reviewed and updated through discharge.     Per nursing patient was given "Data Collection Information Summary for Patients in Inpatient Rehabilitation Facilities with attached "Privacy Act Statement-Health Care Records" upon admission.   

## 2011-06-22 NOTE — Progress Notes (Signed)
ANTICOAGULATION CONSULT NOTE - Follow Up Consult  Pharmacy Consult for Heparin/Coumadin Indication: DVT  No Known Allergies  Patient Measurements: Weight: 217 lb 6 oz (98.6 kg) Heparin Dosing Weight: 92Kg (IBW=71Kg)  Vital Signs: Temp: 98.6 F (37 C) (06/11 0547) Temp src: Oral (06/11 0547) BP: 152/69 mmHg (06/11 0547) Pulse Rate: 72  (06/11 0547)  Labs:  Basename 06/22/11 0840 06/22/11 0555 06/21/11 0940 06/21/11 0630 06/20/11 0600  HGB -- 9.5* -- 9.7* --  HCT -- 29.7* -- 30.0* 25.2*  PLT -- 495* -- 413* 383  APTT -- -- -- -- --  LABPROT -- 14.6 -- -- --  INR -- 1.12 -- -- --  HEPARINUNFRC 0.21* -- 0.34 >2.00* --  CREATININE -- 2.53* -- -- --  CKTOTAL -- -- -- -- --  CKMB -- -- -- -- --  TROPONINI -- -- -- -- --   Assessment: 71 yo with DVT s/p L BKA 06/16/11. Continues on IV heparin, bridging to Coumadin. Heparin level today is low for goal, INR is low as well, expected s/p once dose of Coumadin (would not expect effective anticoagulation for 3-4 days d/t vitamin K based clotting factor half lives ranging from 8-72 hours). H/H fairly stable, Plts elevated. No bleeding reported. Noted concurrent Cipro (for ?UTI), may potentiate INR.  Goal of Therapy:  INR 2-3 Heparin level 0.3-0.7 units/ml Monitor platelets by anticoagulation protocol: Yes   Plan:  - Increase heparin drip to 1600 units/hr, check 8 hour heparin level. -  Repeat Coumadin 7.5mg  PO today - Will f/up daily Hep level, CBC and INR  Louanne Calvillo K 06/22/2011,11:31 AM

## 2011-06-22 NOTE — Progress Notes (Signed)
ANTICOAGULATION CONSULT NOTE - Follow Up Consult  Pharmacy Consult for Heparin Indication: DVT  No Known Allergies  Patient Measurements: Weight: 217 lb 6 oz (98.6 kg) Heparin Dosing Weight: 92Kg (IBW=71Kg)  Vital Signs: Temp: 98.6 F (37 C) (06/11 1516) Temp src: Oral (06/11 1516) BP: 150/52 mmHg (06/11 1516) Pulse Rate: 93  (06/11 1516)  Labs:  Basename 06/22/11 1904 06/22/11 0840 06/22/11 0555 06/21/11 0940 06/21/11 0630 06/20/11 0600  HGB -- -- 9.5* -- 9.7* --  HCT -- -- 29.7* -- 30.0* 25.2*  PLT -- -- 495* -- 413* 383  APTT -- -- -- -- -- --  LABPROT -- -- 14.6 -- -- --  INR -- -- 1.12 -- -- --  HEPARINUNFRC 0.31 0.21* -- 0.34 -- --  CREATININE -- -- 2.53* -- -- --  CKTOTAL -- -- -- -- -- --  CKMB -- -- -- -- -- --  TROPONINI -- -- -- -- -- --   Assessment: 71 yo with DVT s/p L BKA 06/16/11. Continues on IV heparin, bridging to Coumadin. Heparin level is now therapeutic on 1600 units/hr.  H/H fairly stable, Plts elevated. No bleeding reported.  Goal of Therapy:  INR 2-3 Heparin level 0.3-0.7 units/ml Monitor platelets by anticoagulation protocol: Yes   Plan:  - Continue heparin drip at 1600 units/hr, check 8 hour heparin level.  - Will f/up daily Hep level, CBC and INR  Judi Jaffe C 06/22/2011,8:01 PM

## 2011-06-22 NOTE — Evaluation (Signed)
Physical Therapy Assessment and Plan  Patient Details  Name: Aaron Long MRN: [id number] Date of Birth: Aug 23, 1940  PT Diagnosis: Difficulty walking, Muscle weakness and Pain in L residual limb Rehab Potential: Good ELOS: 10 days   Today's Date: 06/22/2011 Time: 4098-1191 Time Calculation (min): 60 min  Problem List:  Patient Active Problem List  Diagnoses  . Diabetic foot ulcer associated with type 2 diabetes mellitus  . HTN (hypertension)  . Diabetes mellitus type 2 with complications  . CAD (coronary artery disease)  . PAD (peripheral artery disease)  . Prostate cancer  . Gangrene due to secondary diabetes mellitus  . Acute kidney injury  . CKD (chronic kidney disease) stage 3, GFR 30-59 ml/min  . Cellulitis  . Diabetic toe ulcer  . Pre-operative cardiovascular examination  . Atherosclerosis of native arteries of the extremities with gangrene  . S/P BKA (below knee amputation)    Past Medical History:  Past Medical History  Diagnosis Date  . Coronary artery disease   . Prostate cancer 2010  . PAD (peripheral artery disease)   . Gangrene of toe     dry  . Neuromuscular disorder     diabetic neuropathy  . Hypertension     takes Amlodipine daily and Metoprolol bid  . Hyperlipidemia     takes Pravastatin daily  . Myocardial infarction 2005  . Peripheral neuropathy   . Constipation     takes Miralax daily  . Hemorrhoids   . Hx of colonic polyps   . History of kidney stones   . Diabetes mellitus     Novolog and Levemir daily   Past Surgical History:  Past Surgical History  Procedure Date  . Prostate surgery 2009    prostatectomy  . Knee cartilage surgery at age 32    left knee  . Cea     Right  . Coronary artery bypass graft 2005    x 4  . Carotid endarterectomy 2005    right  . Cataract surgery 2011    bilateral  . Colonoscopy   . Cardiac catheterization 05/28/11  . Amputation 06/14/2011    Procedure: AMPUTATION DIGIT;  Surgeon: Sherren Kerns, MD;  Location: [facility] OR;  Service: Vascular;  Laterality: Left;  Amputation Left fifth toe  . Amputation 06/16/2011    Procedure: AMPUTATION BELOW KNEE;  Surgeon: Sherren Kerns, MD;  Location[facility] OR;  Service: Vascular;  Laterality: Left;    Assessment & Plan Clinical Impression: Patient is a 71 y.o. year old male with history of diabetes mellitus , peripheral neuropathy and peripheral vascular disease. Admitted 06/10/2011 with dry gangrene of left fifth toe. Recent aortogram with 50% SFA, occluded popliteal and 1 vessel runoff via posterior tibial artery. Initially underwent amputation of left fifth toe with debridement of left foot 06/14/2011. Progressive ischemic changes noted as well as low-grade fever 102. Underwent left below-knee amputation 06/16/2011 per Dr. Darrick Penna. Placed on subcutaneous heparin for DVT prophylaxis. A limb guard was placed per [employer]. Venous Doppler study on 06/19/2011 showed DVT left popliteal vein. Wound cultures of left foot showed MRSA and placed on contact precautions. Patient transferred to CIR on 06/21/2011 .   Patient currently requires min to mod assist with mobility secondary to muscle weakness, decreased cardiorespiratoy endurance and decreased sitting balance, decreased standing balance and decreased postural control.  Prior to hospitalization, patient was independent with mobility and lived with wife and son in a Apartment Press photographer) home.  Home access is 1 platform  step at threshold to enter and 2 story home.  Pt reports they will be modifying the home so that he can stay on the first floor upon d/c.  Patient will benefit from skilled PT intervention to maximize safe functional mobility, minimize fall risk and decrease caregiver burden for planned discharge home with intermittent assist.  Anticipate patient will benefit from follow up OP at discharge.  PT - End of Session Activity Tolerance: Decreased this session Endurance Deficit: Yes PT Assessment Rehab  Potential: Good Barriers to Discharge: Inaccessible home environment PT Plan PT Frequency: 1-2 X/day, 60-90 minutes Estimated Length of Stay: 10 days PT Treatment/Interventions: Ambulation/gait training;Balance/vestibular training;Community reintegration;Discharge planning;Disease management/prevention;DME/adaptive equipment instruction;Functional mobility training;Neuromuscular re-education;Pain management;Patient/family education;Psychosocial support;Skin care/wound management;Splinting/orthotics;Stair training;Therapeutic Activities;Therapeutic Exercise;UE/LE Strength taining/ROM;UE/LE Coordination activities;Wheelchair propulsion/positioning PT Recommendation Follow Up Recommendations: Outpatient PT Equipment Recommended: Rolling walker with 5" wheels;Wheelchair (measurements)  PT Evaluation Precautions/Restrictions Precautions Precautions: Fall Precaution Comments: L BKA Restrictions Weight Bearing Restrictions: No Home Living/Prior Functioning Home Living Lives With: Spouse;Son Available Help at Discharge: Family;Available PRN/intermittently Type of Home: Apartment (Condo) Home Access: Stairs to enter (1 platform step to doorway threshold) Entrance Stairs-Number of Steps: 1 platform step at threshold Entrance Stairs-Rails: None Home Layout: Two level Alternate Level Stairs-Number of Steps:  (pt reports he will be sleeping on first level) Home Adaptive Equipment: Straight cane Prior Function Level of Independence: Independent with basic ADLs;Independent with homemaking with ambulation;Independent with gait;Independent with transfers Able to Take Stairs?: Yes Driving: Yes Vocation: Retired Comments: wife is retired as well Vision/Perception  Biochemist, clinical: Careers adviser Overall Cognitive Status: Appears within functional limits for tasks assessed Arousal/Alertness: Awake/alert Orientation Level: Oriented X4 Problem Solving: Appears intact Sensation Sensation Light  Touch: Appears Intact (pt reprots prior neuropathy of RLE but appears intact at eva) Proprioception: Appears Intact Coordination Gross Motor Movements are Fluid and Coordinated: Yes Motor  Motor Motor: Abnormal postural alignment and control (L BKA) Motor - Skilled Clinical Observations: L lateral lean and wt shift standing in Forensic psychologist Wheelchair Mobility: Yes Wheelchair Assistance: 4: Min Chiropodist Details: Visual cues for safe use of DME/AE;Visual cues/gestures for precautions/safety;Visual cues/gestures for sequencing;Verbal cues for technique;Verbal cues for precautions/safety;Verbal cues for safe use of DME/AE Wheelchair Propulsion: Both upper extremities Wheelchair Parts Management: Supervision/cueing Distance: < 50 feet  Trunk/Postural Assessment  Cervical Assessment Cervical Assessment: Within Functional Limits (slight forward head) Thoracic Assessment Thoracic Assessment: Within Functional Limits Lumbar Assessment Lumbar Assessment: Within Functional Limits Postural Control Postural Control: Deficits on evaluation  Balance Balance Balance Assessed: Yes Static Sitting Balance Static Sitting - Balance Support: Feet supported Static Sitting - Level of Assistance: 5: Stand by assistance Dynamic Sitting Balance Dynamic Sitting- Balance Support: Feet supported, Min A  Static Standing Balance Static Standing - Level of Assistance: 4: Min assist Extremity Assessment  RLE Assessment RLE Assessment: Within Functional Limits (5/5 strength throughout) RLE Strength RLE Overall Strength Comments: Generally 4+ to 5/5 strength throughout LLE Assessment LLE Assessment: Exceptions to WFL (L BKA) LLE Strength LLE Overall Strength Comments: Demonstrates functional strength greater than 3/5  See FIM for current functional status Refer to Care Plan for Long Term Goals  Recommendations for other services: None  Discharge Criteria:  Patient will be discharged from PT if patient refuses treatment 3 consecutive times without medical reason, if treatment goals not met, if there is a change in medical status, if patient makes no progress towards goals or if patient is discharged from hospital.  The above  assessment, treatment plan, treatment alternatives and goals were discussed and mutually agreed upon: by patient and by family  Deirdre Pippins  Skilled PT Treatment Session 1: Time:9:15-10:15 (60 min)  Individual therapy session.  Pt reports 6-8/10 pain in L residual limb and received pain medication during session.  Wife present for PT evaluation and treatment session and assisted with answering questions about pt's history and home as needed.  Tx focused on bed mobility, transferring w/c<-->bed, w/c management and parts, self-propulsion of w/c, and standing in RW.  Pt fatigued quickly with self-propelling w/c and became dizzy upon standing in RW, needing to sit after ~25 sec.  Pt reported that he needed to use the bathroom.  Assisted pt back to room.  Pt performed stand-pivot transfer to toilet using grab bars and +2 assist for safety.  Pt left sitting on commode during BM, nurse tech alerted and notified to assist pt back to bed when done. Educated pt and wife of w/c management, home modifications, use of limb guard at night, phantom pain/sensation management, and limb positioning.  Wife seems supportive, wanting to participate and learn throughout therapy session. 06/22/2011, 10:44 AM

## 2011-06-22 NOTE — Evaluation (Signed)
Occupational Therapy Assessment and Plan & Session Notes  Patient Details  Name: Aaron Long MRN: [id number] Date of Birth: Jul 19, 1940  OT Diagnosis: acute pain and muscle weakness (generalized) Rehab Potential: Rehab Potential: Good ELOS: 7-10 days   Today's Date: 06/22/2011  ASSESSMENT AND PLAN  Problem List:  Patient Active Problem List  Diagnoses  . Diabetic foot ulcer associated with type 2 diabetes mellitus  . HTN (hypertension)  . Diabetes mellitus type 2 with complications  . CAD (coronary artery disease)  . PAD (peripheral artery disease)  . Prostate cancer  . Gangrene due to secondary diabetes mellitus  . Acute kidney injury  . CKD (chronic kidney disease) stage 3, GFR 30-59 ml/min  . Cellulitis  . Diabetic toe ulcer  . Pre-operative cardiovascular examination  . Atherosclerosis of native arteries of the extremities with gangrene  . S/P BKA (below knee amputation)    Past Medical History:  Past Medical History  Diagnosis Date  . Coronary artery disease   . Prostate cancer 2010  . PAD (peripheral artery disease)   . Gangrene of toe     dry  . Neuromuscular disorder     diabetic neuropathy  . Hypertension     takes Amlodipine daily and Metoprolol bid  . Hyperlipidemia     takes Pravastatin daily  . Myocardial infarction 2005  . Peripheral neuropathy   . Constipation     takes Miralax daily  . Hemorrhoids   . Hx of colonic polyps   . History of kidney stones   . Diabetes mellitus     Novolog and Levemir daily   Past Surgical History:  Past Surgical History  Procedure Date  . Prostate surgery 2009    prostatectomy  . Knee cartilage surgery at age 71    left knee  . Cea     Right  . Coronary artery bypass graft 2005    x 4  . Carotid endarterectomy 2005    right  . Cataract surgery 2011    bilateral  . Colonoscopy   . Cardiac catheterization 05/28/11  . Amputation 06/14/2011    Procedure: AMPUTATION DIGIT;  Surgeon: Sherren Kerns,  MD;  Location: [facility] OR;  Service: Vascular;  Laterality: Left;  Amputation Left fifth toe  . Amputation 06/16/2011    Procedure: AMPUTATION BELOW KNEE;  Surgeon: Sherren Kerns, MD;  Location: [facility] OR;  Service: Vascular;  Laterality: Left;    Assessment & Plan Clinical Impression: Aaron Long is a right handed 71 y.o. male with history of diabetes mellitus , peripheral neuropathy and peripheral vascular disease. Admitted 06/10/2011 with dry gangrene of left fifth toe. Recent aortogram with 50% SFA, occluded popliteal and 1 vessel runoff via posterior tibial artery. Initially underwent amputation of left fifth toe with debridement of left foot 06/14/2011. Progressive ischemic changes noted as well as low-grade fever 102. Underwent left below-knee amputation 06/16/2011 per Dr. Darrick Penna. Placed on subcutaneous heparin for DVT prophylaxis. A limb guard was placed per [employer]. Venous Doppler study on 06/19/2011 showed DVT left popliteal vein. Patient transferred to CIR on 06/21/2011 .    Patient currently requires min-total assist with basic self-care skills secondary to muscle weakness and muscle joint tightness and decreased sitting balance, decreased standing balance, decreased postural control and decreased balance strategies.  Prior to hospitalization, patient could complete ADLs, IADLs, and was driving independently.  Patient will benefit from skilled intervention to increase independence with basic self-care skills prior to discharge home with care  partner.  Anticipate patient will require 24 hour supervision and follow up home health.  OT - End of Session Activity Tolerance: Tolerates 10 - 20 min activity with multiple rests Endurance Deficit: Yes OT Assessment Rehab Potential: Good Barriers to Discharge: Inaccessible home environment OT Plan OT Frequency: 1-2 X/day, 60-90 minutes Estimated Length of Stay: 7-10 days OT Treatment/Interventions: Balance/vestibular training;Community  reintegration;Discharge planning;DME/adaptive equipment instruction;Functional mobility training;Neuromuscular re-education;Pain management;Patient/family education;Psychosocial support;Self Care/advanced ADL retraining;Skin care/wound managment;Splinting/orthotics;Therapeutic Activities;Therapeutic Exercise;UE/LE Strength taining/ROM;UE/LE Coordination activities;Wheelchair propulsion/positioning;Disease mangement/prevention OT Recommendation Follow Up Recommendations: Home health OT Equipment Recommended: 3 in 1 bedside comode  Precautions/Restrictions  Precautions Precautions: Fall Precaution Comments: L BKA Required Braces or Orthoses: Other Brace/Splint Other Brace/Splint: limbguard Restrictions Weight Bearing Restrictions: No  General Chart Reviewed: Yes  Home Living/Prior Functioning Home Living Lives With: Spouse;Son Available Help at Discharge: Family;Available PRN/intermittently Type of Home: Apartment (Condo) Home Access: Stairs to enter (1 platform step to doorway threshold) Entrance Stairs-Number of Steps: 1 platform step at threshold Entrance Stairs-Rails: None Home Layout: Able to live on main level with bedroom/bathroom (patient states bed can be on main level, but with 1/2 bath) Alternate Level Stairs-Number of Steps:  (pt reports he will be sleeping on first level) Bathroom Shower/Tub: Tub/shower unit;Curtain Firefighter: Standard Bathroom Accessibility: Yes How Accessible: Accessible via walker Home Adaptive Equipment: Hand-held shower hose;Grab bars in shower Additional Comments: Grab bars in shower on 2nd level IADL History Homemaking Responsibilities: No Current License: Yes Mode of Transportation: Car Occupation: Retired Type of Occupation: retired of [employer] Prior Function Level of Independence: Independent with basic ADLs;Independent with homemaking with ambulation;Independent with gait;Independent with transfers Able to  Take Stairs?: Yes Driving: Yes Vocation: Retired Comments: wife is retired as well  ADL - See FIM  Vision/Perception  Vision - History Baseline Vision: Wears glasses all the time Patient Visual Report: No change from baseline Vision - Assessment Eye Alignment: Within Functional Limits Perception Perception: Within Functional Limits Praxis Praxis: Intact   Cognition Overall Cognitive Status: Appears within functional limits for tasks assessed Arousal/Alertness: Awake/alert Orientation Level: Oriented X4 Problem Solving: Appears intact Comments: patient with some confusion when therapist asked about bathroom set-up on main level; stated there was a walk-in shower...no shower on main level and no walk-in shower throughout condo (per wife)  Sensation Sensation Light Touch: Appears Intact (pt reprots prior neuropathy of RLE but appears intact at eva) Proprioception: Appears Intact Additional Comments: UEs appear intact Coordination Gross Motor Movements are Fluid and Coordinated: Yes Fine Motor Movements are Fluid and Coordinated: Yes  Motor  Motor Motor: Abnormal postural alignment and control (L BKA) Motor - Skilled Clinical Observations: L lateral lean and wt shift standing in RW  Trunk/Postural Assessment  Cervical Assessment Cervical Assessment: Within Functional Limits (slight forward head) Thoracic Assessment Thoracic Assessment: Within Functional Limits Lumbar Assessment Lumbar Assessment: Within Functional Limits Postural Control Postural Control: Deficits on evaluation   Balance Balance Balance Assessed: Yes Static Sitting Balance Static Sitting - Balance Support: Feet supported Static Sitting - Level of Assistance: 5: Stand by assistance Static Standing Balance Static Standing - Level of Assistance: 4: Min assist  Extremity/Trunk Assessment RUE Assessment RUE Assessment: Within Functional Limits (grossly 5/5) LUE Assessment LUE Assessment: Within  Functional Limits (grossly 5/5)  See FIM for current functional status  Refer to Care Plan for Long Term Goals  Recommendations for other services: None  Discharge Criteria: Patient will be discharged from OT if patient refuses treatment 3 consecutive times  without medical reason, if treatment goals not met, if there is a change in medical status, if patient makes no progress towards goals or if patient is discharged from hospital.  The above assessment, treatment plan, treatment alternatives and goals were discussed and mutually agreed upon: by patient and by family  SESSION NOTES  Session #1 1100-1200 - 60 Minutes Individual Therapy Initial 1:1 OT evaluation completed. Focused skilled intervention on ADL retraining at sink level (UB/LB bathing & dressing), sit/stands, grooming tasks seated at sink, and overall activity tolerance/endurance. Also gave patient handout from Exit Care tab; "Dibetes and Foot Care" and inspection mirror. Plan to discuss handout and use of inspection mirror in future therapy sessions.   Session #2 1430-1500 - 30 Minutes Individual Therapy No complaints of pain Patient seated in w/c upon entering room. Patient propelled self -> therapy gym for w/c -> therapy mat transfer with min assist -> supine with min assist. Therapist worked on RLE stretching to help increase independencies with LB ADLs, education regarding Diabetes Foot Care (handout given previous session), education-demonstration-and return demonstration with use of inspection mirror, and education regarding pressure relief. Left patient on therapy mat for next therapy session.   Lealand Elting 06/22/2011, 12:19 PM

## 2011-06-23 LAB — GLUCOSE, CAPILLARY
Glucose-Capillary: 110 mg/dL — ABNORMAL HIGH (ref 70–99)
Glucose-Capillary: 77 mg/dL (ref 70–99)
Glucose-Capillary: 122 mg/dL — ABNORMAL HIGH (ref 70–99)

## 2011-06-23 LAB — CBC
HCT: 30.8 % — ABNORMAL LOW (ref 39.0–52.0)
Hemoglobin: 10 g/dL — ABNORMAL LOW (ref 13.0–17.0)
MCH: 29.2 pg (ref 26.0–34.0)
MCV: 89.8 fL (ref 78.0–100.0)
RBC: 3.43 MIL/uL — ABNORMAL LOW (ref 4.22–5.81)

## 2011-06-23 LAB — URINE CULTURE
Culture  Setup Time: 201306111420
Culture: NO GROWTH

## 2011-06-23 MED ORDER — DOXAZOSIN MESYLATE 1 MG PO TABS
1.0000 mg | ORAL_TABLET | Freq: Every day | ORAL | Status: DC
  Administered 2011-06-23 – 2011-06-30 (×8): 1 mg via ORAL
  Filled 2011-06-23 (×9): qty 1

## 2011-06-23 MED ORDER — WARFARIN SODIUM 7.5 MG PO TABS
7.5000 mg | ORAL_TABLET | Freq: Once | ORAL | Status: AC
  Administered 2011-06-23: 7.5 mg via ORAL
  Filled 2011-06-23: qty 1

## 2011-06-23 NOTE — Progress Notes (Signed)
Occupational Therapy Session Note  Patient Details  Name: Aaron Long MRN: [id number] Date of Birth: 11-May-1940  Today's Date: 06/23/2011  Short Term Goals: Week 1:  OT Short Term Goal 1 (Week 1): Short Term Goals = Long Term Goals  Skilled Therapeutic Interventions/Progress Updates:   Session #1 1030-1130 - 60 Minutes Individual Therapy No complaints of pain Patient seated in w/c upon entering room. Treatment emphasis on ADL retraining at sink level in sit -> stand position. Focused skilled intervention on UB/LB bathing, UB/LB dressing, sit/stands, and overall activity tolerance/endurance. During peri care patient with complaints of sore bottom and therapist noticed redness. Notified RN and RN applied barrier cream. Educated patient and patient's wife on pressure relief, compression wrapping, and do's & don'ts with amputees. Handouts given regarding do's and don'ts with amputees & compression wrapping. Left patient seated in w/c with call bell & phone within reach.   Session #2 1400-1430 - 30 Minutes Individual Therapy No complaints of pain Treatment emphasis on furniture transfers and UE exercises. Patient propelled self -> family room for transfer onto plush, Administrator, armed chair. Patient with mod assist w/c <-> chair and required min assist for verbal cues secondary to decreased coordination during furniture transfers. Therapist then educated patient on theraband UE exercises for HEP. Taught patient 4 exercises, demonstrated the exercises, and had patient return demonstrate exercises. Recommend patient complete 7-10 reps and 2-3 sets of each exercise 2-3 times per day. Wife present at end of session for HEP education.   Precautions:  Precautions Precautions: Fall Precaution Comments: L BKA Required Braces or Orthoses: Other Brace/Splint Other Brace/Splint: limbguard Restrictions Weight Bearing Restrictions: No  See FIM for current functional  status  [name] 06/23/2011, 10:43 AM

## 2011-06-23 NOTE — Progress Notes (Signed)
Physical Therapy Session Note  Patient Details  Name: Aaron Long MRN: [id number] Date of Birth: [birth year]  Today's Date: 06/23/2011 Time: 3790-2409 Time Calculation (min): 65 min  Short Term Goals: Week 1:  PT Short Term Goal 1 (Week 1): =LTG  Skilled Therapeutic Interventions/Progress Updates:  Wife present for therapy session.  Pt reports that he did not sleep well last night, waking up multiple times with night sweats, requiring him to change clothes.  Pt reports that nursing checked his blood sugar and temperature and there were no abnormalities.  Pt was lying supine with no dressing on LLE, noted redness surrounding incision site.  Nursing came to re-wrap residual limb prior to leaving for the gym.  Tx focused on bed mobility, transfer training, and standing for increased independence with mobility.  Seated BP=156/78; HR=94bpm.  Pt stood x 3 from elevated mat with RW, requiring cues for hand placement and min to mod A with sit to stand transfer.  Pt was able to pivot RLE but would fatigue quickly (<1 min).  Pt and wife educated on energy conservation and change in energy consumption with amputation.  Practiced dynamic standing balance with unilateral UE support at walker.  Pt was able to maintain balance with LUE support while reaching above head with RUE better than RUE support reaching overhead with LUE. Worked on  dynamic balance with reaching and trunk rotation in sitting.  No LOB. Educated pt further on the importance of bed positioning, strengthening LLE, and strengthening bilateral LE.  Place splint board under w/c to support LLE residual limb in order to prevent knee flexion contracture and educated pt on its use.  Pt reports understanding.  Therapy Documentation Precautions:  Precautions Precautions: Fall Precaution Comments: L BKA Required Braces or Orthoses: Other Brace/Splint Other Brace/Splint: limbguard Restrictions Weight Bearing Restrictions: No Pain: Pt reports  3/10 pain of L residual limb- premedicated.  See FIM for current functional status  Therapy/Group: Individual Therapy  Deirdre Pippins 06/23/2011, 12:38 PM

## 2011-06-23 NOTE — Plan of Care (Signed)
Problem: RH SKIN INTEGRITY Goal: RH STG SKIN FREE OF INFECTION/BREAKDOWN Outcome: Progressing Barrier cream applied to buttocks for redness  Problem: RH KNOWLEDGE DEFICIT Goal: RH STG INCREASE KNOWLEDGE OF DIABETES Outcome: Not Progressing Depends on wife to implement medications and choices

## 2011-06-23 NOTE — Progress Notes (Signed)
Reviewed and in agreement with treatment provided.  

## 2011-06-23 NOTE — Progress Notes (Addendum)
Patient ID: Aaron Long, male   DOB: Jul 17, 1940, 71 y.o.   MRN: [id number] Subjective/Complaints: Aaron Long is a right handed 71 y.o. male with history of diabetes mellitus , peripheral neuropathy and peripheral vascular disease. Admitted 06/10/2011 with dry gangrene of left fifth toe. Recent aortogram with 50% SFA, occluded popliteal and 1 vessel runoff via posterior tibial artery. Initially underwent amputation of left fifth toe with debridement of left foot 06/14/2011. Progressive ischemic changes noted as well as low-grade fever 102. Underwent left below-knee amputation 06/16/2011 per Dr. Darrick Penna. Placed on subcutaneous heparin for DVT prophylaxis. A limb guard was placed per [employer]. Venous Doppler study on 06/19/2011 showed DVT left popliteal vein  Woke up sweating yesterday evening Mild increase in L stump pain No sob, no dysuria  Review of Systems  Genitourinary: Positive for urgency.  Musculoskeletal: Positive for joint pain.  Neurological: Positive for sensory change.  All other systems reviewed and are negative.    Objective: Vital Signs: Blood pressure 179/81, pulse 95, temperature 98.8 F (37.1 C), temperature source Oral, resp. rate 18, weight 98.9 kg (218 lb 0.6 oz), SpO2 95.00%. No results found. Results for orders placed during the hospital encounter of 06/21/11 (from the past 72 hour(s))  GLUCOSE, CAPILLARY     Status: Abnormal   Collection Time   06/21/11  4:47 PM      Component Value Range Comment   Glucose-Capillary 178 (*) 70 - 99 mg/dL   GLUCOSE, CAPILLARY     Status: Abnormal   Collection Time   06/21/11  7:50 PM      Component Value Range Comment   Glucose-Capillary 127 (*) 70 - 99 mg/dL   URINALYSIS, ROUTINE W REFLEX MICROSCOPIC     Status: Abnormal   Collection Time   06/21/11  8:36 PM      Component Value Range Comment   Color, Urine YELLOW  YELLOW    APPearance CLEAR  CLEAR    Specific Gravity, Urine 1.012  1.005 - 1.030    pH 6.0  5.0 - 8.0     Glucose, UA 100 (*) NEGATIVE mg/dL    Hgb urine dipstick SMALL (*) NEGATIVE    Bilirubin Urine NEGATIVE  NEGATIVE    Ketones, ur NEGATIVE  NEGATIVE mg/dL    Protein, ur 409 (*) NEGATIVE mg/dL    Urobilinogen, UA 1.0  0.0 - 1.0 mg/dL    Nitrite NEGATIVE  NEGATIVE    Leukocytes, UA NEGATIVE  NEGATIVE   URINE CULTURE     Status: Normal   Collection Time   06/21/11  8:36 PM      Component Value Range Comment   Specimen Description URINE, CLEAN CATCH      Special Requests NONE      Culture  Setup Time [id number]      Colony Count NO GROWTH      Culture NO GROWTH      Report Status 06/22/2011 FINAL     URINE MICROSCOPIC-ADD ON     Status: Normal   Collection Time   06/21/11  8:36 PM      Component Value Range Comment   WBC, UA 0-2  <3 WBC/hpf    RBC / HPF 0-2  <3 RBC/hpf   COMPREHENSIVE METABOLIC PANEL     Status: Abnormal   Collection Time   06/22/11  5:55 AM      Component Value Range Comment   Sodium 142  135 - 145 mEq/L    Potassium 4.4  3.5 - 5.1 mEq/L    Chloride 104  96 - 112 mEq/L    CO2 24  19 - 32 mEq/L    Glucose, Bld 63 (*) 70 - 99 mg/dL    BUN 27 (*) 6 - 23 mg/dL    Creatinine, Ser 1.61 (*) 0.50 - 1.35 mg/dL    Calcium 9.5  8.4 - 09.6 mg/dL    Total Protein 7.1  6.0 - 8.3 g/dL    Albumin 2.2 (*) 3.5 - 5.2 g/dL    AST 43 (*) 0 - 37 U/L    ALT 25  0 - 53 U/L    Alkaline Phosphatase 154 (*) 39 - 117 U/L    Total Bilirubin 0.2 (*) 0.3 - 1.2 mg/dL    GFR calc non Af Amer 24 (*) >90 mL/min    GFR calc Af Amer 28 (*) >90 mL/min   DIFFERENTIAL     Status: Abnormal   Collection Time   06/22/11  5:55 AM      Component Value Range Comment   Neutrophils Relative 75  43 - 77 %    Neutro Abs 10.3 (*) 1.7 - 7.7 K/uL    Lymphocytes Relative 15  12 - 46 %    Lymphs Abs 2.1  0.7 - 4.0 K/uL    Monocytes Relative 7  3 - 12 %    Monocytes Absolute 0.9  0.1 - 1.0 K/uL    Eosinophils Relative 3  0 - 5 %    Eosinophils Absolute 0.3  0.0 - 0.7 K/uL    Basophils Relative 0  0 -  1 %    Basophils Absolute 0.0  0.0 - 0.1 K/uL   PROTIME-INR     Status: Normal   Collection Time   06/22/11  5:55 AM      Component Value Range Comment   Prothrombin Time 14.6  11.6 - 15.2 seconds    INR 1.12  0.00 - 1.49   CBC     Status: Abnormal   Collection Time   06/22/11  5:55 AM      Component Value Range Comment   WBC 13.6 (*) 4.0 - 10.5 K/uL    RBC 3.31 (*) 4.22 - 5.81 MIL/uL    Hemoglobin 9.5 (*) 13.0 - 17.0 g/dL    HCT 04.5 (*) 40.9 - 52.0 %    MCV 89.7  78.0 - 100.0 fL    MCH 28.7  26.0 - 34.0 pg    MCHC 32.0  30.0 - 36.0 g/dL    RDW 81.1  91.4 - 78.2 %    Platelets 495 (*) 150 - 400 K/uL   GLUCOSE, CAPILLARY     Status: Abnormal   Collection Time   06/22/11  7:29 AM      Component Value Range Comment   Glucose-Capillary 66 (*) 70 - 99 mg/dL   GLUCOSE, CAPILLARY     Status: Abnormal   Collection Time   06/22/11  7:53 AM      Component Value Range Comment   Glucose-Capillary 68 (*) 70 - 99 mg/dL    Comment 1 Notify RN     GLUCOSE, CAPILLARY     Status: Abnormal   Collection Time   06/22/11  8:39 AM      Component Value Range Comment   Glucose-Capillary 109 (*) 70 - 99 mg/dL    Comment 1 Notify RN     HEPARIN LEVEL (UNFRACTIONATED)     Status: Abnormal   Collection  Time   06/22/11  8:40 AM      Component Value Range Comment   Heparin Unfractionated 0.21 (*) 0.30 - 0.70 IU/mL   GLUCOSE, CAPILLARY     Status: Abnormal   Collection Time   06/22/11 11:30 AM      Component Value Range Comment   Glucose-Capillary 186 (*) 70 - 99 mg/dL    Comment 1 Notify RN     URINALYSIS, ROUTINE W REFLEX MICROSCOPIC     Status: Abnormal   Collection Time   06/22/11  1:59 PM      Component Value Range Comment   Color, Urine YELLOW  YELLOW    APPearance CLOUDY (*) CLEAR    Specific Gravity, Urine 1.018  1.005 - 1.030    pH 5.5  5.0 - 8.0    Glucose, UA 100 (*) NEGATIVE mg/dL    Hgb urine dipstick TRACE (*) NEGATIVE    Bilirubin Urine NEGATIVE  NEGATIVE    Ketones, ur NEGATIVE   NEGATIVE mg/dL    Protein, ur >782 (*) NEGATIVE mg/dL    Urobilinogen, UA 1.0  0.0 - 1.0 mg/dL    Nitrite NEGATIVE  NEGATIVE    Leukocytes, UA NEGATIVE  NEGATIVE   URINE MICROSCOPIC-ADD ON     Status: Abnormal   Collection Time   06/22/11  1:59 PM      Component Value Range Comment   Squamous Epithelial / LPF FEW (*) RARE    WBC, UA 0-2  <3 WBC/hpf    RBC / HPF 0-2  <3 RBC/hpf    Bacteria, UA FEW (*) RARE    Casts GRANULAR CAST (*) NEGATIVE HYALINE CASTS   Urine-Other AMORPHOUS URATES/PHOSPHATES     GLUCOSE, CAPILLARY     Status: Abnormal   Collection Time   06/22/11  4:23 PM      Component Value Range Comment   Glucose-Capillary 113 (*) 70 - 99 mg/dL   HEPARIN LEVEL (UNFRACTIONATED)     Status: Normal   Collection Time   06/22/11  7:04 PM      Component Value Range Comment   Heparin Unfractionated 0.31  0.30 - 0.70 IU/mL   GLUCOSE, CAPILLARY     Status: Abnormal   Collection Time   06/22/11  8:40 PM      Component Value Range Comment   Glucose-Capillary 103 (*) 70 - 99 mg/dL   GLUCOSE, CAPILLARY     Status: Abnormal   Collection Time   06/23/11 12:14 AM      Component Value Range Comment   Glucose-Capillary 116 (*) 70 - 99 mg/dL   PROTIME-INR     Status: Normal   Collection Time   06/23/11  6:15 AM      Component Value Range Comment   Prothrombin Time 14.5  11.6 - 15.2 seconds    INR 1.11  0.00 - 1.49   CBC     Status: Abnormal   Collection Time   06/23/11  6:15 AM      Component Value Range Comment   WBC 14.0 (*) 4.0 - 10.5 K/uL    RBC 3.43 (*) 4.22 - 5.81 MIL/uL    Hemoglobin 10.0 (*) 13.0 - 17.0 g/dL    HCT 95.6 (*) 21.3 - 52.0 %    MCV 89.8  78.0 - 100.0 fL    MCH 29.2  26.0 - 34.0 pg    MCHC 32.5  30.0 - 36.0 g/dL    RDW 08.6  57.8 - 46.9 %  Platelets 486 (*) 150 - 400 K/uL   HEPARIN LEVEL (UNFRACTIONATED)     Status: Normal   Collection Time   06/23/11  6:15 AM      Component Value Range Comment   Heparin Unfractionated 0.42  0.30 - 0.70 IU/mL      HEENT:  normal Cardio: RRR Resp: CTA B/L GI: BS positive Extremity:  Edema L stumpt distal Skin:   Other L stump staples intact Neuro: Alert/Oriented and Abnormal Sensory reduced lt touch R foot Musc/Skel:  Other L BKA   Assessment/Plan: 1. Functional deficits secondary to L BKA due to PVD which require 3+ hours per day of interdisciplinary therapy in a comprehensive inpatient rehab setting. Physiatrist is providing close team supervision and 24 hour management of active medical problems listed below. Physiatrist and rehab team continue to assess barriers to discharge/monitor patient progress toward functional and medical goals. FIM: FIM - Bathing Bathing Steps Patient Completed: Chest;Right Arm;Left Arm;Abdomen;Front perineal area;Right upper leg;Left upper leg Bathing: 4: Min-Patient completes 8-9 6f 10 parts or 75+ percent (needed assistance with buttocks and RLE)  FIM - Upper Body Dressing/Undressing Upper body dressing/undressing steps patient completed: Thread/unthread right sleeve of pullover shirt/dresss;Put head through opening of pull over shirt/dress;Pull shirt over trunk Upper body dressing/undressing: 4: Min-Patient completed 75 plus % of tasks FIM - Lower Body Dressing/Undressing Lower body dressing/undressing steps patient completed: Thread/unthread right underwear leg;Thread/unthread right pants leg Lower body dressing/undressing: 1: Total-Patient completed less than 25% of tasks  FIM - Toileting Toileting steps completed by patient: Adjust clothing prior to toileting Toileting Assistive Devices: Grab bar or rail for support Toileting: 4: Steadying assist     FIM - Banker Devices: Arm rests Bed/Chair Transfer: 4: Supine > Sit: Min A (steadying Pt. > 75%/lift 1 leg);5: Sit > Supine: Supervision (verbal cues/safety issues);3: Bed > Chair or W/C: Mod A (lift or lower assist);3: Chair or W/C > Bed: Mod A (lift or lower assist)  FIM -  Locomotion: Wheelchair Distance: 20'  Locomotion: Wheelchair: 1: Travels less than 50 ft with moderate assistance (Pt: 50 - 74%) FIM - Locomotion: Ambulation Locomotion: Ambulation Assistive Devices: Walker - Rolling Locomotion: Ambulation: 0: Activity did not occur (secondary to Lt. LE pain, desired to go to bed)  Comprehension Comprehension Mode: Auditory Comprehension: 5-Understands complex 90% of the time/Cues < 10% of the time  Expression Expression Mode: Verbal Expression: 5-Expresses complex 90% of the time/cues < 10% of the time  Social Interaction Social Interaction: 6-Interacts appropriately with others with medication or extra time (anti-anxiety, antidepressant).  Problem Solving Problem Solving: 5-Solves complex 90% of the time/cues < 10% of the time  Memory Memory: 5-Recognizes or recalls 90% of the time/requires cueing < 10% of the time  Medical Problem List and Plan:  1. left BKA secondary to PVD 06/16/2011  2. DVT Prophylaxis/Anticoagulation: DVT left popliteal vein. IV heparin/ Coumadin. Discontinue heparin once INR greater than 2.00. Monitor for any bleeding episodes  3. Pain Management: oxycodone as needed, Lyrica 75 mg twice daily has phantom limb pain as well as postop pain  4. Wound culture positive MRSA. Contact precautions completed Zosyn.;  Persistent WBC elevation and sweating but afebrile and wound without drainage.  Will ask [facility] to re eval 5. Diabetes mellitus with neuropathy controlled. Hemoglobin A1c 7.9. Levemir 26 units twice a day. Check CBGs a.c. and at bedtime  6. Hypertension uncontrolled. Norvasc 10 mg daily. Monitor with increased mobility  7. Hyperlipidemia. Zocor  LOS (Days) 2 A FACE TO FACE EVALUATION WAS PERFORMED  Stafford Riviera E 06/23/2011, 8:49 AM

## 2011-06-23 NOTE — Progress Notes (Signed)
Social Work  Social Work Assessment and Plan  Patient Details  Name: Aaron Long MRN: [id number] Date of Birth: 08-Jun-1940  Today's Date: 06/23/2011  Problem List:  Patient Active Problem List  Diagnosis  . Diabetic foot ulcer associated with type 2 diabetes mellitus  . HTN (hypertension)  . Diabetes mellitus type 2 with complications  . CAD (coronary artery disease)  . PAD (peripheral artery disease)  . Prostate cancer  . Gangrene due to secondary diabetes mellitus  . Acute kidney injury  . CKD (chronic kidney disease) stage 3, GFR 30-59 ml/min  . Cellulitis  . Diabetic toe ulcer  . Pre-operative cardiovascular examination  . Atherosclerosis of native arteries of the extremities with gangrene  . S/P BKA (below knee amputation)   Past Medical History:  Past Medical History  Diagnosis Date  . Coronary artery disease   . Prostate cancer 2010  . PAD (peripheral artery disease)   . Gangrene of toe     dry  . Neuromuscular disorder     diabetic neuropathy  . Hypertension     takes Amlodipine daily and Metoprolol bid  . Hyperlipidemia     takes Pravastatin daily  . Myocardial infarction 2005  . Peripheral neuropathy   . Constipation     takes Miralax daily  . Hemorrhoids   . Hx of colonic polyps   . History of kidney stones   . Diabetes mellitus     Novolog and Levemir daily   Past Surgical History:  Past Surgical History  Procedure Date  . Prostate surgery 2009    prostatectomy  . Knee cartilage surgery at age 83    left knee  . Cea     Right  . Coronary artery bypass graft 2005    x 4  . Carotid endarterectomy 2005    right  . Cataract surgery 2011    bilateral  . Colonoscopy   . Cardiac catheterization 05/28/11  . Amputation 06/14/2011    Procedure: AMPUTATION DIGIT;  Surgeon: Sherren Kerns, MD;  Location: [facility] OR;  Service: Vascular;  Laterality: Left;  Amputation Left fifth toe  . Amputation 06/16/2011    Procedure: AMPUTATION BELOW KNEE;   Surgeon: Sherren Kerns, MD;  Location: [facility] OR;  Service: Vascular;  Laterality: Left;   Social History:  reports that he quit smoking about 20 years ago. His smoking use included Cigarettes. He has a 40 pack-year smoking history. He has never used smokeless tobacco. He reports that he does not drink alcohol or use illicit drugs.  Family / Support Systems Marital Status: Married How Long?: 20 years (a 2nd marriage for both) Patient Roles: Spouse;Parent Spouse/Significant Other: wife, Geo Slone @ [phone] or [phone] Children: Together, pt and wife have 3 adult children with two living locally. All work full-time and the two local are very supportive and check in with them frequently. Other Supports: Pt's brother, Alphons Burgert @ [phone] Anticipated Caregiver: wife mainly with supplemental help from son. Ability/Limitations of Caregiver: no limitations Caregiver Availability: 24/7 Family Dynamics: Pt and wife describe very supportive family - wife very attentive to pt througout interview.  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  Admits he was initially very distraught emotionally with loss of leg, "...but I talked to my Jesus and decided I wanted to live and would just do what I had to do...now I'm doing ok..."  Has goals of maybe playing tennis again and  , overall, was very bright and future oriented, therefore, a formal depression screen was deferred at this time.  Will monitor emotional adjustment and have also offered peer visits from the amputee support group if pt so chooses. Recent Psychosocial Issues: none Pyschiatric History: none Substance Abuse History: none  Patient / Family Perceptions, Expectations & Goals Pt/Family understanding of illness & functional limitations: Pt and wife both have good, general understanding of the medical issues that, ultimately, led to his BKA as well as his need for CIR. Premorbid pt/family roles/activities: Pt reports his wife had been assisting him some with ADLs and home management for about one month PTA. Anticipated changes in roles/activities/participation: Wife prepared to continue her caregivier assistance at d/c as needed Pt/family expectations/goals: "I'd love to be able to volley a little tennis again some day".  Community [employer] Agencies: None Premorbid Home Care/DME Agencies: Other (Comment) [facility] ) Transportation available at discharge: yes Resource referrals recommended: Support group (specify) (Amputee Support Group)  Discharge Planning Living Arrangements: Spouse/significant other Support Systems: Spouse/significant other;Children Type of Residence: Private residence Insurance Resources: Administrator (specify) Herbalist) Financial Resources: Social Security Financial Screen Referred: No Living Expenses: Own Money Management: Patient;Spouse Do you have any problems obtaining your medications?: No Home Management: pt and spouse Patient/Family Preliminary Plans: Pt to d/c home with wife as primary support Social Work Anticipated Follow Up Needs: HH/OP;Support Group Expected length of stay: one week  Clinical Impression  Very pleasant, oriented gentleman here after BKA.  Hopeful about his overall recovery and possibility of playing tennis once again.   Supportive wife and family.  No s/s of emotional distress but will monitor.  Megan Salon, Benoit Meech 06/23/2011, 4:50 PM

## 2011-06-23 NOTE — Progress Notes (Signed)
ANTICOAGULATION CONSULT NOTE - Follow Up Consult  Pharmacy Consult for Heparin Indication: DVT  No Known Allergies  Patient Measurements: Weight: 218 lb 0.6 oz (98.9 kg) Heparin Dosing Weight: 92Kg (IBW=71Kg)  Vital Signs: Temp: 98.8 F (37.1 C) (06/12 0557) Temp src: Oral (06/12 0557) BP: 179/81 mmHg (06/12 0557) Pulse Rate: 95  (06/12 0557)  Labs:  Alvira Philips 06/23/11 0615 06/22/11 1904 06/22/11 0840 06/22/11 0555 06/21/11 0630  HGB 10.0* -- -- 9.5* --  HCT 30.8* -- -- 29.7* 30.0*  PLT 486* -- -- 495* 413*  APTT -- -- -- -- --  LABPROT 14.5 -- -- 14.6 --  INR 1.11 -- -- 1.12 --  HEPARINUNFRC 0.42 0.31 0.21* -- --  CREATININE -- -- -- 2.53* --  CKTOTAL -- -- -- -- --  CKMB -- -- -- -- --  TROPONINI -- -- -- -- --   Assessment: 71 yo with DVT s/p L BKA 06/16/11. Continues on IV heparin, bridging to Coumadin. Heparin level is now therapeutic on 1600 units/hr.  Noted to be on Cipro, which can increase effects of coumadin.  H/H fairly stable, Plts elevated. No bleeding reported.  Goal of Therapy:  INR 2-3 Heparin level 0.3-0.7 units/ml Monitor platelets by anticoagulation protocol: Yes   Plan:  - Continue heparin drip at 1600 units/hr - Coumadin 7.5 mg po x 1 - Will f/up daily Hep level, CBC and INR  Jamiee Milholland L. Illene Bolus, PharmD, BCPS Clinical Pharmacist Pager: [phone] 06/23/2011 8:01 AM

## 2011-06-23 NOTE — Progress Notes (Signed)
Physical Therapy Session Note  Patient Details  Name: Aaron Long MRN: [id number] Date of Birth: Oct 30, 1940  Today's Date: 06/23/2011 Time: 7829-5621 Time Calculation (min): 45 min  Short Term Goals: Week 1:  PT Short Term Goal 1 (Week 1): =LTG  Skilled Therapeutic Interventions/Progress Updates:    Wife present for majority of therapy session.  Tx focused on pt self-propelling w/c ~130' with cues needed to increase push and UE ROM during propulsion to improve mobility, speed, and efficiency; bed mobility and transfers in ADL apartment, and therex.  Pt was able to tolerate therex in prone, with cueing needed for proper technique and positioning with bilateral hip exercises.  Pt was motivated throughout session but fatigued quickly with bilateral LE exercises.  Educated pt on bed positioning, bed mobility, stretching, and the benefits of therex.  Pt expressed that he enjoys strengthening activities as it reminds him of our he worked out prior to [employer].  Pt self-propelled w/c back towards room, navigating obstacles, maneuvering backwards, and around turns with minimal cueing.  Therapy Documentation Precautions:  Precautions Precautions: Fall Precaution Comments: L BKA Required Braces or Orthoses: Other Brace/Splint Other Brace/Splint: limbguard Restrictions Weight Bearing Restrictions: No  Pain: Pt reports 1/10 pain in L residual limb- premedicated. See FIM for current functional status  Therapy/Group: Individual Therapy  Deirdre Pippins 06/23/2011, 2:48 PM

## 2011-06-24 DIAGNOSIS — L98499 Non-pressure chronic ulcer of skin of other sites with unspecified severity: Secondary | ICD-10-CM

## 2011-06-24 DIAGNOSIS — S78119A Complete traumatic amputation at level between unspecified hip and knee, initial encounter: Secondary | ICD-10-CM

## 2011-06-24 DIAGNOSIS — I739 Peripheral vascular disease, unspecified: Secondary | ICD-10-CM

## 2011-06-24 DIAGNOSIS — Z5189 Encounter for other specified aftercare: Secondary | ICD-10-CM

## 2011-06-24 LAB — GLUCOSE, CAPILLARY: Glucose-Capillary: 93 mg/dL (ref 70–99)

## 2011-06-24 LAB — CBC
Hemoglobin: 8.6 g/dL — ABNORMAL LOW (ref 13.0–17.0)
RBC: 2.93 MIL/uL — ABNORMAL LOW (ref 4.22–5.81)
WBC: 11.6 10*3/uL — ABNORMAL HIGH (ref 4.0–10.5)

## 2011-06-24 LAB — HEPARIN LEVEL (UNFRACTIONATED): Heparin Unfractionated: 0.3 IU/mL (ref 0.30–0.70)

## 2011-06-24 LAB — PROTIME-INR
INR: 1.32 (ref 0.00–1.49)
Prothrombin Time: 16.6 seconds — ABNORMAL HIGH (ref 11.6–15.2)

## 2011-06-24 MED ORDER — WARFARIN SODIUM 7.5 MG PO TABS
7.5000 mg | ORAL_TABLET | Freq: Once | ORAL | Status: AC
  Administered 2011-06-24: 7.5 mg via ORAL
  Filled 2011-06-24: qty 1

## 2011-06-24 MED ORDER — HEPARIN (PORCINE) IN NACL 100-0.45 UNIT/ML-% IJ SOLN
1650.0000 [IU]/h | INTRAMUSCULAR | Status: DC
  Administered 2011-06-24 – 2011-06-27 (×6): 1650 [IU]/h via INTRAVENOUS
  Filled 2011-06-24 (×10): qty 250

## 2011-06-24 MED ORDER — PREGABALIN 50 MG PO CAPS
100.0000 mg | ORAL_CAPSULE | Freq: Two times a day (BID) | ORAL | Status: DC
  Administered 2011-06-24 – 2011-07-01 (×15): 100 mg via ORAL
  Filled 2011-06-24 (×15): qty 2

## 2011-06-24 NOTE — Patient Care Conference (Signed)
Inpatient RehabilitationTeam Conference Note Date: 06/22/2011   Time: 1:05 PM    Patient Name: Aaron Long      Medical Record Number: [id number]  Date of Birth: [birth year] Sex: Male         Room/Bed: 4005/4005-01 Payor Info: Payor: MEDICARE  Plan: MEDICARE PART A AND B  Product Type: *No Product type*     Admitting Diagnosis: L BKA  Admit Date/Time:  06/21/2011  3:49 PM Admission Comments: No comment available   Primary Diagnosis:  S/P BKA (below knee amputation) Principal Problem: S/P BKA (below knee amputation)  Patient Active Problem List   Diagnosis Date Noted  . S/P BKA (below knee amputation) 06/22/2011  . Atherosclerosis of native arteries of the extremities with gangrene 06/10/2011  . Pre-operative cardiovascular examination 06/01/2011  . Diabetic toe ulcer 05/20/2011  . Acute kidney injury 05/02/2011  . CKD (chronic kidney disease) stage 3, GFR 30-59 ml/min 05/02/2011  . Cellulitis 05/02/2011  . Gangrene due to secondary diabetes mellitus 04/29/2011  . Diabetic foot ulcer associated with type 2 diabetes mellitus 04/28/2011  . HTN (hypertension) 04/28/2011  . Diabetes mellitus type 2 with complications 04/28/2011  . CAD (coronary artery disease) 04/28/2011  . PAD (peripheral artery disease) 04/28/2011  . Prostate cancer 04/28/2011    Expected Discharge Date: Expected Discharge Date: 06/30/11  Team Members Present: Physician: Dr. Claudette Long Case Manager Present: Aaron Spry, RN Social Worker Present: Aaron Jupiter, LCSW Nurse Present: Aaron Brod, RN PT Present: Aaron Long, PT;Aaron Long, PT;Other (comment) Aaron Long) OT Present: Aaron Long, OT SLP Present: Aaron Long, SLP Other (Discipline and Name): Aaron Long, PPS Coordinator     Current Status/Progress Goal Weekly Team Focus  Medical   bbelow-knee amputation, learning stump care  become independent with stump wrapping  edema reduction   Bowel/Bladder   Foley to be  disontinued. Last bowel movement 06/14/11  Continent of bowel and bladder  Monitor for reqular bowel movement   Swallow/Nutrition/ Hydration             ADL's   min assist for UB ADLs and total assist for LB ADLs  overall supervision   ADL retraining, overall activity tolerance/endurance, ADL transfers   Mobility   Min to Mod A at w/c level  Mod I w/c level; Min A for short distance amb with RW  balance training, bed mobility, transfer training, activity tolerance, endurance, strengthening, car transfers, ambulation   Communication             Safety/Cognition/ Behavioral Observations            Pain   No c/o pain  <3  Offer prn pain medication 1hr prior to therapy   Skin   L AKA with staples approximated, healing appropriately,   No additional skin breakdown  Monitor q shift for s/s of infection      *See Interdisciplinary Assessment and Plan and progress notes for long and short-term goals  Barriers to Discharge: lack of education and training regarding and PT care    Possible Resolutions to Barriers:  continue education, continue therapy    Discharge Planning/Teaching Needs:  Home with wife able to provide 24/7 assistance      Team Discussion: Discussed use of limb guard when pt not in therapy. Goals are Mod I w/c level.   Revisions to Treatment Plan: none    Continued Need for Acute Rehabilitation Level of Care: The patient requires daily medical management by a physician with specialized training in  physical medicine and rehabilitation for the following conditions: Daily direction of a multidisciplinary physical rehabilitation program to ensure safe treatment while eliciting the highest outcome that is of practical value to the patient.: Yes Daily medical management of patient stability for increased activity during participation in an intensive rehabilitation regime.: Yes Daily analysis of laboratory values and/or radiology reports with any subsequent need for medication  adjustment of medical intervention for : Post surgical problems  Aaron Long 06/24/2011, 2:00 PM

## 2011-06-24 NOTE — Progress Notes (Addendum)
ANTICOAGULATION CONSULT NOTE - Follow Up Consult  Pharmacy Consult for Heparin Indication: DVT  No Known Allergies  Patient Measurements: Weight: 218 lb 0.6 oz (98.9 kg) Heparin Dosing Weight: 92Kg (IBW=71Kg)  Vital Signs: Temp: 98.7 F (37.1 C) (06/13 0500) Temp src: Oral (06/13 0500) BP: 185/77 mmHg (06/13 0500) Pulse Rate: 89  (06/13 0500)  Labs:  Basename 06/24/11 0630 06/23/11 0615 06/22/11 1904 06/22/11 0555  HGB 8.6* 10.0* -- --  HCT 26.9* 30.8* -- 29.7*  PLT 526* 486* -- 495*  APTT -- -- -- --  LABPROT 16.6* 14.5 -- 14.6  INR 1.32 1.11 -- 1.12  HEPARINUNFRC 0.30 0.42 0.31 --  CREATININE -- -- -- 2.53*  CKTOTAL -- -- -- --  CKMB -- -- -- --  TROPONINI -- -- -- --   Assessment: 71 yo with DVT s/p L BKA 06/16/11. Continues on IV heparin, bridging to Coumadin. Heparin level is therapeutic at lower end of goal range on 1600 units/hr.  INR increasing towards goal.  Noted to be on Cipro, which can increase effects of coumadin.  H/H dropped today, Plts elevated. No bleeding reported.  Goal of Therapy:  INR 2-3 Heparin level 0.3-0.7 units/ml Monitor platelets by anticoagulation protocol: Yes   Plan:  - Increase heparin drip slightly to 1650 units/hr - Coumadin 7.5 mg po x 1 - Will f/up daily Hep level, CBC and INR  Aaron Long, PharmD, BCPS Clinical Pharmacist Pager: [phone] 06/24/2011 8:13 AM

## 2011-06-24 NOTE — Progress Notes (Signed)
Remains continent of bowel and bladder. Last bowel movement 06/23/11. Pain managed with Oxy IR 10mg  q 4 hrs prn. L BKA with staples approximated, drsg cdi. Sacrum, dry, flaky with stage 2 noted between gluteal fold, and small skin tear to L buttock. Area covered with allevyn dressing. Pt instructed to turn q 2hrs, to decrease risk of additonal skin breakdown. No unsafe behavior. Calls appropriately.  Wife in room, supportive, and attending therapy sessions.

## 2011-06-24 NOTE — Progress Notes (Signed)
Physical Therapy Session Note  Patient Details  Name: Aaron Long MRN: [id number] Date of Birth: [birth year]  Today's Date: 06/24/2011 Time: 1105-1200 Time Calculation (min): 55 min  Short Term Goals: Week 1:  PT Short Term Goal 1 (Week 1): =LTG  Skilled Therapeutic Interventions/Progress Updates:  Pt reported sleeping well last night, with no episodes of night sweats.  Tx focused on transferring w/c-->mat, static standing balance in RW with weight shifting, and gait in RW.  Gait x '10 with min A at gait belt in RW before feeling dizzy and requiring to rest.  Vitals taken: BP=154/76; SpO2=96%; HR=91bpm.  Pt reported decreased dizziness once seated.  Pt continued gait x ~60' with 3 more rest breaks with decreased gait speed to improve ambulation abilities, activity tolerance, strengthening, and cardiorespiratory endurance.  Pt demonstrated proper technique with quarter-turn and fatigued quickly with gait.  Pt was only able to hop backwards once in RW before needing to sit d/t fatigue.  Discussed progression in POC with patient and need for improved endurance, strengthening, and balance as well as energy expenditure with ambulation.  Pt self-propelled w/c back to room for cardiorespiratory endurance and UE muscular endurance. Pt was motivated throughout session with positive outlook.  Therapy Documentation Precautions:  Precautions Precautions: Fall Precaution Comments: L BKA Required Braces or Orthoses: Other Brace/Splint Other Brace/Splint: limbguard Restrictions Weight Bearing Restrictions: No  Pain: Pt reports 8/10 pain in L residual limb prior to therapy session.  RN notified and brought pt pain medication during session.  Pt reported decreased pain at end of session.  See FIM for current functional status  Therapy/Group: Individual Therapy  Deirdre Pippins 06/24/2011, 12:00 PM

## 2011-06-24 NOTE — Progress Notes (Signed)
Occupational Therapy Session Notes  Patient Details  Name: Aaron Long MRN: [id number] Date of Birth: December 06, 1940  Today's Date: 06/24/2011  Short Term Goals: Week 1:  OT Short Term Goal 1 (Week 1): Short Term Goals = Long Term Goals  Skilled Therapeutic Interventions/Progress Updates:   Session #1 [phone] - 55 Minutes Individual Therapy Patient with 8/10 complaints of pain, notified RN and repositioned patient Upon entering room patient supine in bed with complaints of pain in left residual limb. Encouraged patient to work through therapy and notified RN of pain complaints. Patient agreeable and motivated for therapy session. Engaged in bed mobility for edge of bed -> w/c squat pivot transfer with minimal assistance. Patient then engaged in ADL retraining at sink level in sit -> stand position. RN present for application of dressing for skin breakdown -> sacrum area. Focused skilled intervention on overall activity tolerance/endurance, pain management, sit/stands, grooming tasks seated at sink in w/c, and UB/LB bathing & dressing with emphasis on LB dressing secondary to patient with difficult time crossing right leg for foot care/donning & doffing socks & shoes. Patient with more than reasonable amount of time to complete ADL tasks, but motivated to be independent . Left patient seated in w/c with call bell and phone within reach. Discussed and recommend patient perform pressure relief every 20-30 minutes; patient aware and able to verbalize and demonstrate understanding.   Session #2 1400-1430 - 30 Minutes Individual Therapy No complaints of pain Patient supine in bed when entering room. Engaged in bed mobility for donning right shoe sitting edge of bed, patient then performed edge of bed -> w/c squat pivot transfer with min assist. Patient then propelled self -> therapy gym for UE exercises. Patient engaged in taught HEP (using therband) and able to complete with supervision,  therapist added w/c pushups into HEP. Patient then sat at [facility] for UE strengthening & overall endurance for 5 minutes on "random" setting, 2.5 minutes forwards and 2.5 minutes backwards.   Precautions:  Precautions Precautions: Fall Precaution Comments: L BKA Required Braces or Orthoses: Other Brace/Splint Other Brace/Splint: limbguard Restrictions Weight Bearing Restrictions: No  See FIM for current functional status  Avrum Kimball 06/24/2011, 10:11 AM

## 2011-06-24 NOTE — Progress Notes (Signed)
Physical Therapy Session Note  Patient Details  Name: Aaron Long MRN: [id number] Date of Birth: Nov 05, 1940  Today's Date: 06/24/2011 Time: 1500-1550 Time Calculation (min): 50 min  Short Term Goals: Week 1:  PT Short Term Goal 1 (Week 1): =LTG  Skilled Therapeutic Interventions/Progress Updates:    Pt reports being a little fatigued after working so hard this morning in PT/OT.  Tx focused on self-propulsion of w/c >150' with cueing and education to increase speed, efficiency, and stride of push for improved w/c mobility, endurance, activity tolerance, and UE endurance.  Educated pt on car transfers with RW.  Pt demonstrated gait with RW to car min A and car transfer to sedan with Min A for steadying.  Worked on dynamic balance in standing with side-stepping in RW to left and right.  Finished session with therex on mat for L residual limb strengthening.  Pt continues to wear limb guard in w/c for comfort with LLE propped on wooden board.  Returned pt to room and left with call bell in reach.  Therapy Documentation Precautions:  Precautions Precautions: Fall Precaution Comments: L BKA Required Braces or Orthoses: Other Brace/Splint Other Brace/Splint: limbguard Restrictions Weight Bearing Restrictions: No  Pain: No c/o pain during therapy session.  See FIM for current functional status  Therapy/Group: Individual Therapy  Deirdre Pippins 06/24/2011, 3:59 PM

## 2011-06-24 NOTE — Progress Notes (Signed)
Reviewed and in agreement with treatment provided.  

## 2011-06-24 NOTE — Care Management Note (Signed)
Per State Regulation 482.30 This chart was reviewed for medical necessity with respect to the patient's Admission/Duration of stay. Pt participating txs.  Gave pt & wife team conf report Tuesday afternoon: ELOS 06/30/11  Mod I w/c level goals.  Wife concerned about LOS--too short--she does agree that pt is participating well. Still on heparin d/t DVT LLE -transitioning to Coumadin.   [name]                 Nurse Care Manager              Next Review Date: 06/29/11

## 2011-06-24 NOTE — Progress Notes (Signed)
Patient ID: Aaron Long, male   DOB: 02/23/1940, 70 y.o.   MRN: [id number] Patient ID: Aaron Long, male   DOB: 17-Feb-1940, 71 y.o.   MRN: [id number] Subjective/Complaints:   Mild increase in L stump pain- having phantom limb pain also No sob, no dysuria  Review of Systems  Genitourinary: Positive for urgency.  Musculoskeletal: Positive for joint pain.  Neurological: Positive for sensory change.  All other systems reviewed and are negative.    Objective: Vital Signs: Blood pressure 185/77, pulse 89, temperature 98.7 F (37.1 C), temperature source Oral, resp. rate 19, weight 98.9 kg (218 lb 0.6 oz), SpO2 98.00%. No results found. Results for orders placed during the hospital encounter of 06/21/11 (from the past 72 hour(s))  GLUCOSE, CAPILLARY     Status: Abnormal   Collection Time   06/21/11  4:47 PM      Component Value Range Comment   Glucose-Capillary 178 (*) 70 - 99 mg/dL   GLUCOSE, CAPILLARY     Status: Abnormal   Collection Time   06/21/11  7:50 PM      Component Value Range Comment   Glucose-Capillary 127 (*) 70 - 99 mg/dL   URINALYSIS, ROUTINE W REFLEX MICROSCOPIC     Status: Abnormal   Collection Time   06/21/11  8:36 PM      Component Value Range Comment   Color, Urine YELLOW  YELLOW    APPearance CLEAR  CLEAR    Specific Gravity, Urine 1.012  1.005 - 1.030    pH 6.0  5.0 - 8.0    Glucose, UA 100 (*) NEGATIVE mg/dL    Hgb urine dipstick SMALL (*) NEGATIVE    Bilirubin Urine NEGATIVE  NEGATIVE    Ketones, ur NEGATIVE  NEGATIVE mg/dL    Protein, ur 132 (*) NEGATIVE mg/dL    Urobilinogen, UA 1.0  0.0 - 1.0 mg/dL    Nitrite NEGATIVE  NEGATIVE    Leukocytes, UA NEGATIVE  NEGATIVE   URINE CULTURE     Status: Normal   Collection Time   06/21/11  8:36 PM      Component Value Range Comment   Specimen Description URINE, CLEAN CATCH      Special Requests NONE      Culture  Setup Time [id number]      Colony Count NO GROWTH      Culture NO GROWTH      Report Status 06/22/2011 FINAL     URINE MICROSCOPIC-ADD ON     Status: Normal   Collection Time   06/21/11  8:36 PM      Component Value Range Comment   WBC, UA 0-2  <3 WBC/hpf    RBC / HPF 0-2  <3 RBC/hpf   COMPREHENSIVE METABOLIC PANEL     Status: Abnormal   Collection Time   06/22/11  5:55 AM      Component Value Range Comment   Sodium 142  135 - 145 mEq/L    Potassium 4.4  3.5 - 5.1 mEq/L    Chloride 104  96 - 112 mEq/L    CO2 24  19 - 32 mEq/L    Glucose, Bld 63 (*) 70 - 99 mg/dL    BUN 27 (*) 6 - 23 mg/dL    Creatinine, Ser 4.40 (*) 0.50 - 1.35 mg/dL    Calcium 9.5  8.4 - 34.7 mg/dL    Total Protein 7.1  6.0 - 8.3 g/dL    Albumin 2.2 (*) 3.5 - 5.2  g/dL    AST 43 (*) 0 - 37 U/L    ALT 25  0 - 53 U/L    Alkaline Phosphatase 154 (*) 39 - 117 U/L    Total Bilirubin 0.2 (*) 0.3 - 1.2 mg/dL    GFR calc non Af Amer 24 (*) >90 mL/min    GFR calc Af Amer 28 (*) >90 mL/min   DIFFERENTIAL     Status: Abnormal   Collection Time   06/22/11  5:55 AM      Component Value Range Comment   Neutrophils Relative 75  43 - 77 %    Neutro Abs 10.3 (*) 1.7 - 7.7 K/uL    Lymphocytes Relative 15  12 - 46 %    Lymphs Abs 2.1  0.7 - 4.0 K/uL    Monocytes Relative 7  3 - 12 %    Monocytes Absolute 0.9  0.1 - 1.0 K/uL    Eosinophils Relative 3  0 - 5 %    Eosinophils Absolute 0.3  0.0 - 0.7 K/uL    Basophils Relative 0  0 - 1 %    Basophils Absolute 0.0  0.0 - 0.1 K/uL   PROTIME-INR     Status: Normal   Collection Time   06/22/11  5:55 AM      Component Value Range Comment   Prothrombin Time 14.6  11.6 - 15.2 seconds    INR 1.12  0.00 - 1.49   CBC     Status: Abnormal   Collection Time   06/22/11  5:55 AM      Component Value Range Comment   WBC 13.6 (*) 4.0 - 10.5 K/uL    RBC 3.31 (*) 4.22 - 5.81 MIL/uL    Hemoglobin 9.5 (*) 13.0 - 17.0 g/dL    HCT 13.0 (*) 86.5 - 52.0 %    MCV 89.7  78.0 - 100.0 fL    MCH 28.7  26.0 - 34.0 pg    MCHC 32.0  30.0 - 36.0 g/dL    RDW 78.4  69.6 - 29.5 %      Platelets 495 (*) 150 - 400 K/uL   GLUCOSE, CAPILLARY     Status: Abnormal   Collection Time   06/22/11  7:29 AM      Component Value Range Comment   Glucose-Capillary 66 (*) 70 - 99 mg/dL   GLUCOSE, CAPILLARY     Status: Abnormal   Collection Time   06/22/11  7:53 AM      Component Value Range Comment   Glucose-Capillary 68 (*) 70 - 99 mg/dL    Comment 1 Notify RN     GLUCOSE, CAPILLARY     Status: Abnormal   Collection Time   06/22/11  8:39 AM      Component Value Range Comment   Glucose-Capillary 109 (*) 70 - 99 mg/dL    Comment 1 Notify RN     HEPARIN LEVEL (UNFRACTIONATED)     Status: Abnormal   Collection Time   06/22/11  8:40 AM      Component Value Range Comment   Heparin Unfractionated 0.21 (*) 0.30 - 0.70 IU/mL   GLUCOSE, CAPILLARY     Status: Abnormal   Collection Time   06/22/11 11:30 AM      Component Value Range Comment   Glucose-Capillary 186 (*) 70 - 99 mg/dL    Comment 1 Notify RN     URINE CULTURE     Status: Normal   Collection  Time   06/22/11  1:59 PM      Component Value Range Comment   Specimen Description URINE, CLEAN CATCH      Special Requests NONE      Culture  Setup Time [id number]      Colony Count NO GROWTH      Culture NO GROWTH      Report Status 06/23/2011 FINAL     URINALYSIS, ROUTINE W REFLEX MICROSCOPIC     Status: Abnormal   Collection Time   06/22/11  1:59 PM      Component Value Range Comment   Color, Urine YELLOW  YELLOW    APPearance CLOUDY (*) CLEAR    Specific Gravity, Urine 1.018  1.005 - 1.030    pH 5.5  5.0 - 8.0    Glucose, UA 100 (*) NEGATIVE mg/dL    Hgb urine dipstick TRACE (*) NEGATIVE    Bilirubin Urine NEGATIVE  NEGATIVE    Ketones, ur NEGATIVE  NEGATIVE mg/dL    Protein, ur >811 (*) NEGATIVE mg/dL    Urobilinogen, UA 1.0  0.0 - 1.0 mg/dL    Nitrite NEGATIVE  NEGATIVE    Leukocytes, UA NEGATIVE  NEGATIVE   URINE MICROSCOPIC-ADD ON     Status: Abnormal   Collection Time   06/22/11  1:59 PM      Component Value  Range Comment   Squamous Epithelial / LPF FEW (*) RARE    WBC, UA 0-2  <3 WBC/hpf    RBC / HPF 0-2  <3 RBC/hpf    Bacteria, UA FEW (*) RARE    Casts GRANULAR CAST (*) NEGATIVE HYALINE CASTS   Urine-Other AMORPHOUS URATES/PHOSPHATES     GLUCOSE, CAPILLARY     Status: Abnormal   Collection Time   06/22/11  4:23 PM      Component Value Range Comment   Glucose-Capillary 113 (*) 70 - 99 mg/dL   HEPARIN LEVEL (UNFRACTIONATED)     Status: Normal   Collection Time   06/22/11  7:04 PM      Component Value Range Comment   Heparin Unfractionated 0.31  0.30 - 0.70 IU/mL   GLUCOSE, CAPILLARY     Status: Abnormal   Collection Time   06/22/11  8:40 PM      Component Value Range Comment   Glucose-Capillary 103 (*) 70 - 99 mg/dL   GLUCOSE, CAPILLARY     Status: Abnormal   Collection Time   06/23/11 12:14 AM      Component Value Range Comment   Glucose-Capillary 116 (*) 70 - 99 mg/dL   PROTIME-INR     Status: Normal   Collection Time   06/23/11  6:15 AM      Component Value Range Comment   Prothrombin Time 14.5  11.6 - 15.2 seconds    INR 1.11  0.00 - 1.49   CBC     Status: Abnormal   Collection Time   06/23/11  6:15 AM      Component Value Range Comment   WBC 14.0 (*) 4.0 - 10.5 K/uL    RBC 3.43 (*) 4.22 - 5.81 MIL/uL    Hemoglobin 10.0 (*) 13.0 - 17.0 g/dL    HCT 91.4 (*) 78.2 - 52.0 %    MCV 89.8  78.0 - 100.0 fL    MCH 29.2  26.0 - 34.0 pg    MCHC 32.5  30.0 - 36.0 g/dL    RDW 95.6  21.3 - 08.6 %    Platelets  486 (*) 150 - 400 K/uL   HEPARIN LEVEL (UNFRACTIONATED)     Status: Normal   Collection Time   06/23/11  6:15 AM      Component Value Range Comment   Heparin Unfractionated 0.42  0.30 - 0.70 IU/mL   GLUCOSE, CAPILLARY     Status: Abnormal   Collection Time   06/23/11  7:15 AM      Component Value Range Comment   Glucose-Capillary 110 (*) 70 - 99 mg/dL    Comment 1 Notify RN     GLUCOSE, CAPILLARY     Status: Abnormal   Collection Time   06/23/11 11:21 AM      Component Value  Range Comment   Glucose-Capillary 148 (*) 70 - 99 mg/dL    Comment 1 Notify RN     GLUCOSE, CAPILLARY     Status: Normal   Collection Time   06/23/11  4:17 PM      Component Value Range Comment   Glucose-Capillary 77  70 - 99 mg/dL   GLUCOSE, CAPILLARY     Status: Abnormal   Collection Time   06/23/11  8:40 PM      Component Value Range Comment   Glucose-Capillary 122 (*) 70 - 99 mg/dL      HEENT: normal Cardio: RRR Resp: CTA B/L GI: BS positive Extremity:  Edema L stumpt distal Skin:   Other L stump staples intact Neuro: Alert/Oriented and Abnormal Sensory reduced lt touch R foot Musc/Skel:  Other L BKA mild redness along tibial crest. Wound looks great-well approximated, no drainage   Assessment/Plan: 1. Functional deficits secondary to L BKA due to PVD which require 3+ hours per day of interdisciplinary therapy in a comprehensive inpatient rehab setting. Physiatrist is providing close team supervision and 24 hour management of active medical problems listed below. Physiatrist and rehab team continue to assess barriers to discharge/monitor patient progress toward functional and medical goals. FIM: FIM - Bathing Bathing Steps Patient Completed: Chest;Right Arm;Left Arm;Abdomen;Front perineal area;Right upper leg;Left upper leg Bathing: 4: Min-Patient completes 8-9 7f 10 parts or 75+ percent  FIM - Upper Body Dressing/Undressing Upper body dressing/undressing steps patient completed: Thread/unthread right sleeve of pullover shirt/dresss;Thread/unthread left sleeve of pullover shirt/dress;Put head through opening of pull over shirt/dress;Pull shirt over trunk Upper body dressing/undressing: 5: Supervision: Safety issues/verbal cues FIM - Lower Body Dressing/Undressing Lower body dressing/undressing steps patient completed: Thread/unthread right underwear leg;Thread/unthread left underwear leg;Thread/unthread right pants leg;Thread/unthread left pants leg Lower body  dressing/undressing: 2: Max-Patient completed 25-49% of tasks  FIM - Toileting Toileting steps completed by patient: Adjust clothing prior to toileting Toileting Assistive Devices: Grab bar or rail for support Toileting: 4: Steadying assist     FIM - Banker Devices: Arm rests Bed/Chair Transfer: 5: Supine > Sit: Supervision (verbal cues/safety issues);4: Sit > Supine: Min A (steadying pt. > 75%/lift 1 leg);4: Bed > Chair or W/C: Min A (steadying Pt. > 75%);4: Chair or W/C > Bed: Min A (steadying Pt. > 75%)  FIM - Locomotion: Wheelchair Distance: 20'  Locomotion: Wheelchair: 2: Travels 50 - 149 ft with supervision, cueing or coaxing (cues for increased push and UE ROM with propulsion) FIM - Locomotion: Ambulation Locomotion: Ambulation Assistive Devices: Designer, industrial/product Ambulation/Gait Assistance: 3: Mod assist Locomotion: Ambulation: 0: Activity did not occur (Pt stood with RW, able to pivot foot but not amb)  Comprehension Comprehension Mode: Auditory Comprehension: 5-Understands complex 90% of the time/Cues < 10% of the time  Expression Expression Mode: Verbal Expression: 5-Expresses complex 90% of the time/cues < 10% of the time  Social Interaction Social Interaction: 5-Interacts appropriately 90% of the time - Needs monitoring or encouragement for participation or interaction.  Problem Solving Problem Solving: 5-Solves complex 90% of the time/cues < 10% of the time  Memory Memory: 5-Recognizes or recalls 90% of the time/requires cueing < 10% of the time  Medical Problem List and Plan:  1. left BKA secondary to PVD 06/16/2011  2. DVT Prophylaxis/Anticoagulation: DVT left popliteal vein. IV heparin/ Coumadin. Discontinue heparin once INR greater than 2.00. (still below 2 at present) 3. Pain Management: oxycodone as needed, Lyrica 75 mg twice daily has phantom limb pain as well as postop pain- increase to 100mg  4. Wound culture  positive MRSA. Contact precautions completed Zosyn.;  Persistent WBC elevation and sweating but afebrile and wound without drainage.  cipro on board 5. Diabetes mellitus with neuropathy controlled. Hemoglobin A1c 7.9. Levemir 26 units twice a day. Check CBGs a.c. and at bedtime. Sugars have been low in the am. Encourage HS snack 6. Hypertension uncontrolled. Norvasc 10 mg daily. Monitor with increased mobility  7. Hyperlipidemia. Zocor    LOS (Days) 3 A FACE TO FACE EVALUATION WAS PERFORMED  Contrell Ballentine T 06/24/2011, 7:43 AM

## 2011-06-25 DIAGNOSIS — Z5189 Encounter for other specified aftercare: Secondary | ICD-10-CM

## 2011-06-25 DIAGNOSIS — S78119A Complete traumatic amputation at level between unspecified hip and knee, initial encounter: Secondary | ICD-10-CM

## 2011-06-25 DIAGNOSIS — I739 Peripheral vascular disease, unspecified: Secondary | ICD-10-CM

## 2011-06-25 DIAGNOSIS — L98499 Non-pressure chronic ulcer of skin of other sites with unspecified severity: Secondary | ICD-10-CM

## 2011-06-25 LAB — PROTIME-INR
INR: 1.6 — ABNORMAL HIGH (ref 0.00–1.49)
Prothrombin Time: 19.3 seconds — ABNORMAL HIGH (ref 11.6–15.2)

## 2011-06-25 LAB — CULTURE, BLOOD (ROUTINE X 2)
Culture  Setup Time: 201306081818
Culture: NO GROWTH

## 2011-06-25 LAB — GLUCOSE, CAPILLARY
Glucose-Capillary: 59 mg/dL — ABNORMAL LOW (ref 70–99)
Glucose-Capillary: 99 mg/dL (ref 70–99)

## 2011-06-25 LAB — CBC
MCV: 90.7 fL (ref 78.0–100.0)
Platelets: 505 10*3/uL — ABNORMAL HIGH (ref 150–400)
RDW: 14.3 % (ref 11.5–15.5)
WBC: 10.5 10*3/uL (ref 4.0–10.5)

## 2011-06-25 MED ORDER — WARFARIN SODIUM 6 MG PO TABS
6.0000 mg | ORAL_TABLET | Freq: Once | ORAL | Status: AC
  Administered 2011-06-25: 6 mg via ORAL
  Filled 2011-06-25: qty 1

## 2011-06-25 MED ORDER — INSULIN DETEMIR 100 UNIT/ML ~~LOC~~ SOLN
20.0000 [IU] | Freq: Every day | SUBCUTANEOUS | Status: DC
Start: 2011-06-25 — End: 2011-07-01
  Administered 2011-06-25 – 2011-06-29 (×5): 20 [IU] via SUBCUTANEOUS

## 2011-06-25 MED ORDER — INSULIN DETEMIR 100 UNIT/ML ~~LOC~~ SOLN
15.0000 [IU] | Freq: Every day | SUBCUTANEOUS | Status: DC
  Administered 2011-06-26 – 2011-06-29 (×5): 15 [IU] via SUBCUTANEOUS

## 2011-06-25 NOTE — Progress Notes (Signed)
Reviewed and in agreement with treatment provided.  

## 2011-06-25 NOTE — Progress Notes (Signed)
0945 Patient's iv came out during therapy.  IV team called for new line insertion. Patient returned to bed.

## 2011-06-25 NOTE — Progress Notes (Signed)
Physical Therapy Session Note  Patient Details  Name: KOLTYN KELSAY MRN: [id number] Date of Birth: 08/06/1940  Today's Date: 06/25/2011 Time: 8295-6213 Time Calculation (min): 45 min  Short Term Goals: Week 1:  PT Short Term Goal 1 (Week 1): =LTG  Skilled Therapeutic Interventions/Progress Updates:  Pt reports increased pain prior to session and is frustrated after having such a great day in therapies yesterday.  Therapist provided encouragement and offered to ask RN for pain medication.  Pt reports he had just received some meds for pain.  Tx focused on managing curb step to improve accessibility as pt has 3 platform steps to get into house.  Educated patient on ascending curb backwards using RW.  Pt amb with decreased strength, speed, and balance in approaching the curb.  Pt attempted curb but was unable to complete curb step safely, requiring max A of 2 due to residual limb pain and insufficient "hop" on RLE. Educated and demonstrated "bump up" in w/c with patient.  Remainder of session focused on dynamic sitting balance with reaching and trunk rotation to improve balance, activity tolerance, and core strengthening.  Pt left in w/c in gym with RN adjusting pt's PIV.  Therapy Documentation Precautions:  Precautions Precautions: Fall Precaution Comments: L BKA Required Braces or Orthoses: Other Brace/Splint Other Brace/Splint: limbguard Restrictions Weight Bearing Restrictions: No  Pain: Pt reports severe increase in pain since OT session earlier that morning.  Pt reports 9 or 10/10 pain in residual limb and that he had just received pain medication from RN.  Pain affected pt's ability to complete interventions successfully during session.  Pt reports decreased pain by the end of session.  See FIM for current functional status  Therapy/Group: Individual Therapy  Deirdre Pippins 06/25/2011, 12:46 PM

## 2011-06-25 NOTE — Progress Notes (Signed)
ANTICOAGULATION CONSULT NOTE - Follow Up Consult  Pharmacy Consult for Heparin Indication: DVT  No Known Allergies  Patient Measurements: Weight: 218 lb 0.6 oz (98.9 kg) Heparin Dosing Weight: 92Kg (IBW=71Kg)  Vital Signs: Temp: 98.4 F (36.9 C) (06/14 0524) Temp src: Oral (06/14 0524) BP: 148/74 mmHg (06/14 0524) Pulse Rate: 82  (06/14 0524)  Labs:  Basename 06/25/11 0635 06/24/11 0630 06/23/11 0615  HGB 8.9* 8.6* --  HCT 28.3* 26.9* 30.8*  PLT 505* 526* 486*  APTT -- -- --  LABPROT 19.3* 16.6* 14.5  INR 1.60* 1.32 1.11  HEPARINUNFRC 0.44 0.30 0.42  CREATININE -- -- --  CKTOTAL -- -- --  CKMB -- -- --  TROPONINI -- -- --   Assessment: 71 yo with DVT s/p L BKA 06/16/11. Continues on IV heparin, bridging to Coumadin. Heparin level is therapeutic at lower end of goal range on 1600 units/hr.  INR increasing towards goal.  Noted to be on Cipro, which can increase effects of coumadin.   No bleeding reported.  Goal of Therapy:  INR 2-3 Heparin level 0.3-0.7 units/ml Monitor platelets by anticoagulation protocol: Yes   Plan:  - Continue heparin drip @ 1650 units/hr - Coumadin 6 mg po x 1 - Will f/up daily Hep level, CBC and INR  Talbert Cage, PharmD Clinical Pharmacist 06/25/2011 9:45 AM

## 2011-06-25 NOTE — Progress Notes (Signed)
Occupational Therapy Session Notes  Patient Details  Name: COULSON WEHNER MRN: [id number] Date of Birth: [birth year]  Today's Date: 06/25/2011  Short Term Goals: Week 1:  OT Short Term Goal 1 (Week 1): Short Term Goals = Long Term Goals  Skilled Therapeutic Interventions/Progress Updates:   Session #1 [phone] - 68 Minutes Individual Therapy No complaints of pain Patient supine in bed eating breakfast upon entering room. Engaged in bed mobility for edge of bed -> w/c transfer with supervision for bed mobility and min assist for transfer. Patient then sat at sink for UB ADLs and performed LB ADLs in sit -> stand position. Patient able to wash & dry RLE including foot and used inspection mirror to inspect right foot. Focused skilled intervention on ADL retraining, overall activity tolerance/endurance, pain management, and compression wrapping to left residual limb. Left patient seated in w/c beside bed with call bell & phone within reach.   Session #2 [id number] - 45 Minutes Individual Therapy No complaints of pain Upon entering room patient supine in bed. Engaged in bed mobility for edge of bed -> w/c transfer with steady assist. Once patient seated in w/c worked on w/c management with bilateral leg rests. Therapist modified w/c leg rests to help increase independence with doffing them prn. Patient then propelled self -> family room to engage in furniture transfers. Patient transferred w/c -> armed seat with min assist. Patient then performed sit -> stands with max assist (sit -> stand from a low, soft surface). Second sit -> stand patient required max assist and body mechanics were poor. On third attempt patient able to stand with mod assist and after education & demonstration regarding body mechanics for sit -> stand patient demonstrated good body mechanics and a smoother sit -> stand. From there ambulated ~3 steps to sit into w/c. Patient pleased with progress during session. At end of  session took patient back to room with call bell & phone within reach and encouraged patient to engage in UE HEP.   Precautions:  Precautions Precautions: Fall Precaution Comments: L BKA Required Braces or Orthoses: Other Brace/Splint Other Brace/Splint: limbguard Restrictions Weight Bearing Restrictions: No  See FIM for current functional status  Bridger Pizzi 06/25/2011, 7:50 AM

## 2011-06-25 NOTE — Progress Notes (Signed)
Subjective/Complaints:   No changes in pain. Sugar low again today 60 No sob, no dysuria  Review of Systems  Genitourinary: Positive for urgency.  Musculoskeletal: Positive for joint pain.  Neurological: Positive for sensory change.  All other systems reviewed and are negative.    Objective: Vital Signs: Blood pressure 148/74, pulse 82, temperature 98.4 F (36.9 C), temperature source Oral, resp. rate 18, weight 98.9 kg (218 lb 0.6 oz), SpO2 97.00%. No results found. Results for orders placed during the hospital encounter of 06/21/11 (from the past 72 hour(s))  GLUCOSE, CAPILLARY     Status: Abnormal   Collection Time   06/22/11  7:29 AM      Component Value Range Comment   Glucose-Capillary 66 (*) 70 - 99 mg/dL   GLUCOSE, CAPILLARY     Status: Abnormal   Collection Time   06/22/11  7:53 AM      Component Value Range Comment   Glucose-Capillary 68 (*) 70 - 99 mg/dL    Comment 1 Notify RN     GLUCOSE, CAPILLARY     Status: Abnormal   Collection Time   06/22/11  8:39 AM      Component Value Range Comment   Glucose-Capillary 109 (*) 70 - 99 mg/dL    Comment 1 Notify RN     HEPARIN LEVEL (UNFRACTIONATED)     Status: Abnormal   Collection Time   06/22/11  8:40 AM      Component Value Range Comment   Heparin Unfractionated 0.21 (*) 0.30 - 0.70 IU/mL   GLUCOSE, CAPILLARY     Status: Abnormal   Collection Time   06/22/11 11:30 AM      Component Value Range Comment   Glucose-Capillary 186 (*) 70 - 99 mg/dL    Comment 1 Notify RN     URINE CULTURE     Status: Normal   Collection Time   06/22/11  1:59 PM      Component Value Range Comment   Specimen Description URINE, CLEAN CATCH      Special Requests NONE      Culture  Setup Time [id number]      Colony Count NO GROWTH      Culture NO GROWTH      Report Status 06/23/2011 FINAL     URINALYSIS, ROUTINE W REFLEX MICROSCOPIC     Status: Abnormal   Collection Time   06/22/11  1:59 PM      Component Value Range Comment   Color, Urine YELLOW  YELLOW    APPearance CLOUDY (*) CLEAR    Specific Gravity, Urine 1.018  1.005 - 1.030    pH 5.5  5.0 - 8.0    Glucose, UA 100 (*) NEGATIVE mg/dL    Hgb urine dipstick TRACE (*) NEGATIVE    Bilirubin Urine NEGATIVE  NEGATIVE    Ketones, ur NEGATIVE  NEGATIVE mg/dL    Protein, ur >811 (*) NEGATIVE mg/dL    Urobilinogen, UA 1.0  0.0 - 1.0 mg/dL    Nitrite NEGATIVE  NEGATIVE    Leukocytes, UA NEGATIVE  NEGATIVE   URINE MICROSCOPIC-ADD ON     Status: Abnormal   Collection Time   06/22/11  1:59 PM      Component Value Range Comment   Squamous Epithelial / LPF FEW (*) RARE    WBC, UA 0-2  <3 WBC/hpf    RBC / HPF 0-2  <3 RBC/hpf    Bacteria, UA FEW (*) RARE    Casts GRANULAR  CAST (*) NEGATIVE HYALINE CASTS   Urine-Other AMORPHOUS URATES/PHOSPHATES     GLUCOSE, CAPILLARY     Status: Abnormal   Collection Time   06/22/11  4:23 PM      Component Value Range Comment   Glucose-Capillary 113 (*) 70 - 99 mg/dL   HEPARIN LEVEL (UNFRACTIONATED)     Status: Normal   Collection Time   06/22/11  7:04 PM      Component Value Range Comment   Heparin Unfractionated 0.31  0.30 - 0.70 IU/mL   GLUCOSE, CAPILLARY     Status: Abnormal   Collection Time   06/22/11  8:40 PM      Component Value Range Comment   Glucose-Capillary 103 (*) 70 - 99 mg/dL   GLUCOSE, CAPILLARY     Status: Abnormal   Collection Time   06/23/11 12:14 AM      Component Value Range Comment   Glucose-Capillary 116 (*) 70 - 99 mg/dL   PROTIME-INR     Status: Normal   Collection Time   06/23/11  6:15 AM      Component Value Range Comment   Prothrombin Time 14.5  11.6 - 15.2 seconds    INR 1.11  0.00 - 1.49   CBC     Status: Abnormal   Collection Time   06/23/11  6:15 AM      Component Value Range Comment   WBC 14.0 (*) 4.0 - 10.5 K/uL    RBC 3.43 (*) 4.22 - 5.81 MIL/uL    Hemoglobin 10.0 (*) 13.0 - 17.0 g/dL    HCT 86.5 (*) 78.4 - 52.0 %    MCV 89.8  78.0 - 100.0 fL    MCH 29.2  26.0 - 34.0 pg    MCHC  32.5  30.0 - 36.0 g/dL    RDW 69.6  29.5 - 28.4 %    Platelets 486 (*) 150 - 400 K/uL   HEPARIN LEVEL (UNFRACTIONATED)     Status: Normal   Collection Time   06/23/11  6:15 AM      Component Value Range Comment   Heparin Unfractionated 0.42  0.30 - 0.70 IU/mL   GLUCOSE, CAPILLARY     Status: Abnormal   Collection Time   06/23/11  7:15 AM      Component Value Range Comment   Glucose-Capillary 110 (*) 70 - 99 mg/dL    Comment 1 Notify RN     GLUCOSE, CAPILLARY     Status: Abnormal   Collection Time   06/23/11 11:21 AM      Component Value Range Comment   Glucose-Capillary 148 (*) 70 - 99 mg/dL    Comment 1 Notify RN     GLUCOSE, CAPILLARY     Status: Normal   Collection Time   06/23/11  4:17 PM      Component Value Range Comment   Glucose-Capillary 77  70 - 99 mg/dL   GLUCOSE, CAPILLARY     Status: Abnormal   Collection Time   06/23/11  8:40 PM      Component Value Range Comment   Glucose-Capillary 122 (*) 70 - 99 mg/dL   PROTIME-INR     Status: Abnormal   Collection Time   06/24/11  6:30 AM      Component Value Range Comment   Prothrombin Time 16.6 (*) 11.6 - 15.2 seconds    INR 1.32  0.00 - 1.49   CBC     Status: Abnormal   Collection Time  06/24/11  6:30 AM      Component Value Range Comment   WBC 11.6 (*) 4.0 - 10.5 K/uL    RBC 2.93 (*) 4.22 - 5.81 MIL/uL    Hemoglobin 8.6 (*) 13.0 - 17.0 g/dL    HCT 16.1 (*) 09.6 - 52.0 %    MCV 91.8  78.0 - 100.0 fL    MCH 29.4  26.0 - 34.0 pg    MCHC 32.0  30.0 - 36.0 g/dL    RDW 04.5  40.9 - 81.1 %    Platelets 526 (*) 150 - 400 K/uL   HEPARIN LEVEL (UNFRACTIONATED)     Status: Normal   Collection Time   06/24/11  6:30 AM      Component Value Range Comment   Heparin Unfractionated 0.30  0.30 - 0.70 IU/mL   GLUCOSE, CAPILLARY     Status: Abnormal   Collection Time   06/24/11  7:44 AM      Component Value Range Comment   Glucose-Capillary 66 (*) 70 - 99 mg/dL    Comment 1 Notify RN     GLUCOSE, CAPILLARY     Status: Normal    Collection Time   06/24/11  8:20 AM      Component Value Range Comment   Glucose-Capillary 79  70 - 99 mg/dL    Comment 1 Notify RN     GLUCOSE, CAPILLARY     Status: Abnormal   Collection Time   06/24/11 12:01 PM      Component Value Range Comment   Glucose-Capillary 136 (*) 70 - 99 mg/dL    Comment 1 Notify RN     GLUCOSE, CAPILLARY     Status: Normal   Collection Time   06/24/11  4:54 PM      Component Value Range Comment   Glucose-Capillary 93  70 - 99 mg/dL    Comment 1 Notify RN     GLUCOSE, CAPILLARY     Status: Abnormal   Collection Time   06/24/11  8:57 PM      Component Value Range Comment   Glucose-Capillary 125 (*) 70 - 99 mg/dL    Comment 1 Notify RN     CBC     Status: Abnormal   Collection Time   06/25/11  6:35 AM      Component Value Range Comment   WBC 10.5  4.0 - 10.5 K/uL    RBC 3.12 (*) 4.22 - 5.81 MIL/uL    Hemoglobin 8.9 (*) 13.0 - 17.0 g/dL    HCT 91.4 (*) 78.2 - 52.0 %    MCV 90.7  78.0 - 100.0 fL    MCH 28.5  26.0 - 34.0 pg    MCHC 31.4  30.0 - 36.0 g/dL    RDW 95.6  21.3 - 08.6 %    Platelets 505 (*) 150 - 400 K/uL      HEENT: normal Cardio: RRR Resp: CTA B/L GI: BS positive Extremity:  Edema L stumpt distal Skin:   Other L stump staples intact Neuro: Alert/Oriented and Abnormal Sensory reduced lt touch R foot Musc/Skel:  Other L BKA mild redness along tibial crest. Wound looks great-well approximated, no drainage   Assessment/Plan: 1. Functional deficits secondary to L BKA due to PVD which require 3+ hours per day of interdisciplinary therapy in a comprehensive inpatient rehab setting. Physiatrist is providing close team supervision and 24 hour management of active medical problems listed below. Physiatrist and rehab team continue to assess  barriers to discharge/monitor patient progress toward functional and medical goals.  I reviewed prosthetic process with patient today.  FIM: FIM - Bathing Bathing Steps Patient Completed: Chest;Right  Arm;Left Arm;Abdomen;Front perineal area;Right upper leg;Left upper leg;Right lower leg (including foot) Bathing: 4: Min-Patient completes 8-9 42f 10 parts or 75+ percent  FIM - Upper Body Dressing/Undressing Upper body dressing/undressing steps patient completed: Thread/unthread right sleeve of pullover shirt/dresss;Thread/unthread left sleeve of pullover shirt/dress;Put head through opening of pull over shirt/dress;Pull shirt over trunk Upper body dressing/undressing: 5: Supervision: Safety issues/verbal cues FIM - Lower Body Dressing/Undressing Lower body dressing/undressing steps patient completed: Thread/unthread right underwear leg;Thread/unthread left underwear leg;Thread/unthread right pants leg;Thread/unthread left pants leg;Don/Doff right shoe Lower body dressing/undressing: 2: Max-Patient completed 25-49% of tasks  FIM - Toileting Toileting steps completed by patient: Adjust clothing prior to toileting Toileting Assistive Devices: Grab bar or rail for support Toileting: 4: Steadying assist  FIM - Archivist Transfers: 0-Activity did not occur  FIM - Banker Devices: Bed rails;HOB elevated Bed/Chair Transfer: 5: Supine > Sit: Supervision (verbal cues/safety issues);5: Sit > Supine: Supervision (verbal cues/safety issues);4: Bed > Chair or W/C: Min A (steadying Pt. > 75%);4: Chair or W/C > Bed: Min A (steadying Pt. > 75%)  FIM - Locomotion: Wheelchair Distance: 20'  Locomotion: Wheelchair: 5: Travels 150 ft or more: maneuvers on rugs and over door sills with supervision, cueing or coaxing FIM - Locomotion: Ambulation Locomotion: Ambulation Assistive Devices: Designer, industrial/product Ambulation/Gait Assistance: 4: Min assist Locomotion: Ambulation: 1: Travels less than 50 ft with minimal assistance (Pt.>75%)  Comprehension Comprehension Mode: Auditory Comprehension: 6-Follows complex conversation/direction: With extra time/assistive  device  Expression Expression Mode: Verbal Expression: 6-Expresses complex ideas: With extra time/assistive device  Social Interaction Social Interaction: 7-Interacts appropriately with others - No medications needed.  Problem Solving Problem Solving: 6-Solves complex problems: With extra time  Memory Memory: 6-More than reasonable amt of time  Medical Problem List and Plan:  1. left BKA secondary to PVD 06/16/2011  2. DVT Prophylaxis/Anticoagulation: DVT left popliteal vein. IV heparin/ Coumadin. Discontinue heparin once INR greater than 2.00. (still below 2 at present) 3. Pain Management: oxycodone as needed, Lyrica 100mg  bid 4. Wound culture positive MRSA. Contact precautions completed Zosyn.;  Persistent WBC elevation and sweating but afebrile and wound without drainage.  cipro on board- afebrile 5. Diabetes mellitus with neuropathy controlled. Hemoglobin A1c 7.9. Levemir 26 units twice a day. Check CBGs a.c. and at bedtime. Sugars still low despite snack. Decrease levemir  6. Hypertension uncontrolled. Norvasc 10 mg daily. Monitor with increased mobility  7. Hyperlipidemia. Zocor    LOS (Days) 4 A FACE TO FACE EVALUATION WAS PERFORMED  Aaron Long T 06/25/2011, 7:15 AM

## 2011-06-26 LAB — CBC
MCV: 91.6 fL (ref 78.0–100.0)
Platelets: 515 10*3/uL — ABNORMAL HIGH (ref 150–400)
RBC: 2.86 MIL/uL — ABNORMAL LOW (ref 4.22–5.81)
RDW: 14.2 % (ref 11.5–15.5)
WBC: 9.3 10*3/uL (ref 4.0–10.5)

## 2011-06-26 LAB — GLUCOSE, CAPILLARY
Glucose-Capillary: 139 mg/dL — ABNORMAL HIGH (ref 70–99)
Glucose-Capillary: 90 mg/dL (ref 70–99)
Glucose-Capillary: 117 mg/dL — ABNORMAL HIGH (ref 70–99)

## 2011-06-26 LAB — PROTIME-INR
INR: 1.89 — ABNORMAL HIGH (ref 0.00–1.49)
Prothrombin Time: 22 seconds — ABNORMAL HIGH (ref 11.6–15.2)

## 2011-06-26 LAB — HEPARIN LEVEL (UNFRACTIONATED): Heparin Unfractionated: 0.47 IU/mL (ref 0.30–0.70)

## 2011-06-26 MED ORDER — WARFARIN SODIUM 7.5 MG PO TABS
7.5000 mg | ORAL_TABLET | Freq: Once | ORAL | Status: AC
  Administered 2011-06-26: 7.5 mg via ORAL
  Filled 2011-06-26: qty 1

## 2011-06-26 NOTE — Progress Notes (Signed)
Patient ID: Aaron Long, male   DOB: 07-23-1940, 71 y.o.   MRN: [id number] Subjective/Complaints:   Leg tender yesterday. Sugar better today. Pain better last night No sob, no dysuria  Review of Systems  Genitourinary: Positive for urgency.  Musculoskeletal: Positive for joint pain.  Neurological: Positive for sensory change.  All other systems reviewed and are negative.    Objective: Vital Signs: Blood pressure 156/71, pulse 82, temperature 98.5 F (36.9 C), temperature source Oral, resp. rate 18, weight 97.1 kg (214 lb 1.1 oz), SpO2 96.00%. No results found. Results for orders placed during the hospital encounter of 06/21/11 (from the past 72 hour(s))  GLUCOSE, CAPILLARY     Status: Abnormal   Collection Time   06/23/11 11:21 AM      Component Value Range Comment   Glucose-Capillary 148 (*) 70 - 99 mg/dL    Comment 1 Notify RN     GLUCOSE, CAPILLARY     Status: Normal   Collection Time   06/23/11  4:17 PM      Component Value Range Comment   Glucose-Capillary 77  70 - 99 mg/dL   GLUCOSE, CAPILLARY     Status: Abnormal   Collection Time   06/23/11  8:40 PM      Component Value Range Comment   Glucose-Capillary 122 (*) 70 - 99 mg/dL   PROTIME-INR     Status: Abnormal   Collection Time   06/24/11  6:30 AM      Component Value Range Comment   Prothrombin Time 16.6 (*) 11.6 - 15.2 seconds    INR 1.32  0.00 - 1.49   CBC     Status: Abnormal   Collection Time   06/24/11  6:30 AM      Component Value Range Comment   WBC 11.6 (*) 4.0 - 10.5 K/uL    RBC 2.93 (*) 4.22 - 5.81 MIL/uL    Hemoglobin 8.6 (*) 13.0 - 17.0 g/dL    HCT 40.9 (*) 81.1 - 52.0 %    MCV 91.8  78.0 - 100.0 fL    MCH 29.4  26.0 - 34.0 pg    MCHC 32.0  30.0 - 36.0 g/dL    RDW 91.4  78.2 - 95.6 %    Platelets 526 (*) 150 - 400 K/uL   HEPARIN LEVEL (UNFRACTIONATED)     Status: Normal   Collection Time   06/24/11  6:30 AM      Component Value Range Comment   Heparin Unfractionated 0.30  0.30 - 0.70 IU/mL    GLUCOSE, CAPILLARY     Status: Abnormal   Collection Time   06/24/11  7:44 AM      Component Value Range Comment   Glucose-Capillary 66 (*) 70 - 99 mg/dL    Comment 1 Notify RN     GLUCOSE, CAPILLARY     Status: Normal   Collection Time   06/24/11  8:20 AM      Component Value Range Comment   Glucose-Capillary 79  70 - 99 mg/dL    Comment 1 Notify RN     GLUCOSE, CAPILLARY     Status: Abnormal   Collection Time   06/24/11 12:01 PM      Component Value Range Comment   Glucose-Capillary 136 (*) 70 - 99 mg/dL    Comment 1 Notify RN     GLUCOSE, CAPILLARY     Status: Normal   Collection Time   06/24/11  4:54 PM  Component Value Range Comment   Glucose-Capillary 93  70 - 99 mg/dL    Comment 1 Notify RN     GLUCOSE, CAPILLARY     Status: Abnormal   Collection Time   06/24/11  8:57 PM      Component Value Range Comment   Glucose-Capillary 125 (*) 70 - 99 mg/dL    Comment 1 Notify RN     PROTIME-INR     Status: Abnormal   Collection Time   06/25/11  6:35 AM      Component Value Range Comment   Prothrombin Time 19.3 (*) 11.6 - 15.2 seconds    INR 1.60 (*) 0.00 - 1.49   CBC     Status: Abnormal   Collection Time   06/25/11  6:35 AM      Component Value Range Comment   WBC 10.5  4.0 - 10.5 K/uL    RBC 3.12 (*) 4.22 - 5.81 MIL/uL    Hemoglobin 8.9 (*) 13.0 - 17.0 g/dL    HCT 40.9 (*) 81.1 - 52.0 %    MCV 90.7  78.0 - 100.0 fL    MCH 28.5  26.0 - 34.0 pg    MCHC 31.4  30.0 - 36.0 g/dL    RDW 91.4  78.2 - 95.6 %    Platelets 505 (*) 150 - 400 K/uL   HEPARIN LEVEL (UNFRACTIONATED)     Status: Normal   Collection Time   06/25/11  6:35 AM      Component Value Range Comment   Heparin Unfractionated 0.44  0.30 - 0.70 IU/mL   GLUCOSE, CAPILLARY     Status: Abnormal   Collection Time   06/25/11  7:14 AM      Component Value Range Comment   Glucose-Capillary 62 (*) 70 - 99 mg/dL    Comment 1 Notify RN     GLUCOSE, CAPILLARY     Status: Normal   Collection Time   06/25/11  7:53 AM       Component Value Range Comment   Glucose-Capillary 76  70 - 99 mg/dL    Comment 1 Notify RN     GLUCOSE, CAPILLARY     Status: Abnormal   Collection Time   06/25/11 11:19 AM      Component Value Range Comment   Glucose-Capillary 124 (*) 70 - 99 mg/dL    Comment 1 Notify RN     GLUCOSE, CAPILLARY     Status: Abnormal   Collection Time   06/25/11  4:30 PM      Component Value Range Comment   Glucose-Capillary 59 (*) 70 - 99 mg/dL    Comment 1 Notify RN     GLUCOSE, CAPILLARY     Status: Normal   Collection Time   06/25/11  5:04 PM      Component Value Range Comment   Glucose-Capillary 99  70 - 99 mg/dL    Comment 1 Notify RN     GLUCOSE, CAPILLARY     Status: Abnormal   Collection Time   06/25/11  9:48 PM      Component Value Range Comment   Glucose-Capillary 139 (*) 70 - 99 mg/dL   PROTIME-INR     Status: Abnormal   Collection Time   06/26/11  6:39 AM      Component Value Range Comment   Prothrombin Time 22.0 (*) 11.6 - 15.2 seconds    INR 1.89 (*) 0.00 - 1.49   CBC  Status: Abnormal   Collection Time   06/26/11  6:39 AM      Component Value Range Comment   WBC 9.3  4.0 - 10.5 K/uL    RBC 2.86 (*) 4.22 - 5.81 MIL/uL    Hemoglobin 8.3 (*) 13.0 - 17.0 g/dL    HCT 45.4 (*) 09.8 - 52.0 %    MCV 91.6  78.0 - 100.0 fL    MCH 29.0  26.0 - 34.0 pg    MCHC 31.7  30.0 - 36.0 g/dL    RDW 11.9  14.7 - 82.9 %    Platelets 515 (*) 150 - 400 K/uL   HEPARIN LEVEL (UNFRACTIONATED)     Status: Normal   Collection Time   06/26/11  6:39 AM      Component Value Range Comment   Heparin Unfractionated 0.47  0.30 - 0.70 IU/mL      HEENT: normal Cardio: RRR Resp: CTA B/L GI: BS positive Extremity:  Edema L stumpt distal Skin:   Other L stump staples intact Neuro: Alert/Oriented and Abnormal Sensory reduced lt touch R foot Musc/Skel:  Other L BKA minimal redness along tibial crest. Wound looks great-well approximated, no drainage Probably some short term memory  deficits  Assessment/Plan: 1. Functional deficits secondary to L BKA due to PVD which require 3+ hours per day of interdisciplinary therapy in a comprehensive inpatient rehab setting. Physiatrist is providing close team supervision and 24 hour management of active medical problems listed below. Physiatrist and rehab team continue to assess barriers to discharge/monitor patient progress toward functional and medical goals.  I  FIM: FIM - Bathing Bathing Steps Patient Completed: Chest;Right Arm;Left Arm;Abdomen;Front perineal area;Right upper leg;Left upper leg;Right lower leg (including foot) Bathing: 4: Min-Patient completes 8-9 23f 10 parts or 75+ percent  FIM - Upper Body Dressing/Undressing Upper body dressing/undressing steps patient completed: Thread/unthread right sleeve of pullover shirt/dresss;Thread/unthread left sleeve of pullover shirt/dress;Put head through opening of pull over shirt/dress;Pull shirt over trunk Upper body dressing/undressing: 5: Supervision: Safety issues/verbal cues FIM - Lower Body Dressing/Undressing Lower body dressing/undressing steps patient completed: Thread/unthread right underwear leg;Thread/unthread left underwear leg;Thread/unthread right pants leg;Thread/unthread left pants leg;Don/Doff right shoe;Fasten/unfasten right shoe Lower body dressing/undressing: 3: Mod-Patient completed 50-74% of tasks  FIM - Toileting Toileting steps completed by patient: Adjust clothing prior to toileting Toileting Assistive Devices: Grab bar or rail for support Toileting: 4: Steadying assist  FIM - Archivist Transfers: 0-Activity did not occur  FIM - Banker Devices: Environmental consultant;Arm rests Bed/Chair Transfer: 4: Bed > Chair or W/C: Min A (steadying Pt. > 75%);4: Chair or W/C > Bed: Min A (steadying Pt. > 75%)  FIM - Locomotion: Wheelchair Distance: 20'  Locomotion: Wheelchair: 5: Travels 150 ft or more: maneuvers on  rugs and over door sills with supervision, cueing or coaxing FIM - Locomotion: Ambulation Locomotion: Ambulation Assistive Devices: Designer, industrial/product Ambulation/Gait Assistance: 4: Min assist Locomotion: Ambulation: 1: Travels less than 50 ft with minimal assistance (Pt.>75%)  Comprehension Comprehension Mode: Auditory Comprehension: 6-Follows complex conversation/direction: With extra time/assistive device  Expression Expression Mode: Verbal Expression: 6-Expresses complex ideas: With extra time/assistive device  Social Interaction Social Interaction: 6-Interacts appropriately with others with medication or extra time (anti-anxiety, antidepressant).  Problem Solving Problem Solving: 6-Solves complex problems: With extra time  Memory Memory: 6-More than reasonable amt of time  Medical Problem List and Plan:  1. left BKA secondary to PVD 06/16/2011  2. DVT Prophylaxis/Anticoagulation: DVT left popliteal vein.  IV heparin/ Coumadin. Discontinue heparin once INR greater than 2.00. (still below 2 at present but approaching) 3. Pain Management: oxycodone as needed, Lyrica 100mg  bid-hold on further titration at present 4. Wound culture positive MRSA. Contact precautions completed Zosyn.;  Persistent WBC elevation and sweating but afebrile and wound without drainage.  cipro on board- afebrile- Duration of rx? 5. Diabetes mellitus with neuropathy controlled. Hemoglobin A1c 7.9. Levemir 26 units twice a day. Check CBGs a.c. and at bedtime. Sugars still low despite snack. Decreased levemir  6. Hypertension uncontrolled. Norvasc 10 mg daily. Monitor with increased mobility  7. Hyperlipidemia. Zocor    LOS (Days) 5 A FACE TO FACE EVALUATION WAS PERFORMED  Vaiden Adames T 06/26/2011, 7:55 AM

## 2011-06-26 NOTE — Progress Notes (Signed)
Occupational Therapy Session Note  Patient Details  Name: Aaron Long MRN: [id number] Date of Birth: 1940/09/07  Today's Date: 06/26/2011 Time: 1324-4010 and 2725-3664 Time Calculation (min): 82 min and 47 minutes=129 minutes  Skilled Therapeutic Interventions/Progress Updates: AM session:  ADL in w/c at sink with focus on reaching right foot for washing and dressing and standing balance for bathing/dressing lower body; wife present during am session;         PM session:  In gym on mat with focus on UE strengthening and overall endurance to increase endurance for self care and transfers;  Also worked on transfers and bed mobility    Therapy Documentation Precautions:  Precautions Precautions: Fall Precaution Comments: L BKA Required Braces or Orthoses: Other Brace/Splint Other Brace/Splint: left limb guard, left residual limb wrapping Restrictions Weight Bearing Restrictions: No  Pain: no c/os during session See FIM for current functional status  Therapy/Group: Individual Therapy  Bud Face [facility] 06/26/2011, 5:02 PM

## 2011-06-26 NOTE — Progress Notes (Signed)
Physical Therapy Session Note  Patient Details  Name: Aaron Long MRN: [id number] Date of Birth: May 20, 1940  Today's Date: 06/26/2011 Time: 0900-1000  60 minutes  Short Term Goals: Week 1:  PT Short Term Goal 1 (Week 1): =LTG PT Short Term Goal 1 - Progress (Week 1): Progressing toward goal  Skilled Therapeutic Interventions/Progress Updates:     Therapy Documentation Precautions:  Precautions Precautions: Fall Precaution Comments: L BKA Required Braces or Orthoses: Other Brace/Splint Other Brace/Splint: left limb guard, left residual limb wrapping Restrictions Weight Bearing Restrictions: No Pain: Pain Assessment Pain Assessment: 0-10 Pain Score:   2 Pain Type: Surgical pain Pain Location: Leg Pain Orientation: Left;Distal Pain Descriptors: Aching Pain Frequency: Intermittent Pain Onset: Gradual Pain Intervention(s): Other (Comment) (patient premedicated, rest breaks prn) Multiple Pain Sites: No  Treatments:  Patient declined out of room this morning until after scheduled ADL session with OT at 1045. Patient was agreeable to in room LE exercise for increased functional strength and ROM. Patient performed 2x15 reps each bilaterally: quad sets, glute sets, SLR, SAQ, adduction isometrics with emphasis on slow and controlled lowering for increased eccentric control. In right sidelying patient performed left LE hip abduction, hip extension, and hamstring curls for increased functional strength. Reviewed these exercises with patient and wife for performance in room as HEP. Wife has handouts, and both are in agreement with this plan.  See FIM for current functional status  Therapy/Group: Individual Therapy  Aaron Long 06/26/2011, 11:55 AM

## 2011-06-26 NOTE — Progress Notes (Signed)
ANTICOAGULATION CONSULT NOTE - Follow Up Consult  Pharmacy Consult for Heparin Indication: DVT  No Known Allergies  Patient Measurements: Weight: 214 lb 1.1 oz (97.1 kg) Heparin Dosing Weight: 92Kg (IBW=71Kg)  Vital Signs: Temp: 98.5 F (36.9 C) (06/15 0500) Temp src: Oral (06/15 0500) BP: 137/70 mmHg (06/15 1024) Pulse Rate: 81  (06/15 1024)  Labs:  [facility] 06/26/11 0639 06/25/11 0635 06/24/11 0630  HGB 8.3* 8.9* --  HCT 26.2* 28.3* 26.9*  PLT 515* 505* 526*  APTT -- -- --  LABPROT 22.0* 19.3* 16.6*  INR 1.89* 1.60* 1.32  HEPARINUNFRC 0.47 0.44 0.30  CREATININE -- -- --  CKTOTAL -- -- --  CKMB -- -- --  TROPONINI -- -- --   Assessment: 71 yo with DVT s/p L BKA 06/16/11. Continues on IV heparin, bridging to Coumadin. Heparin level is therapeutic on 1650 units/hr.  INR increasing towards goal.  Noted to be on Cipro (since 6/10), which can increase effects of coumadin.  H/H and plts stable  Goal of Therapy:  INR 2-3 Heparin level 0.3-0.7 units/ml Monitor platelets by anticoagulation protocol: Yes   Plan:  - Continue heparin drip @ 1650 units/hr - Coumadin 7.5 mg po x 1 - Will f/up daily Hep level, CBC and INR- plan to d/c heparin when INR >2 for 48H - Please consider d/c Cipro soon  [place] K. Allena Katz, PharmD, BCPS.  Clinical Pharmacist Pager [phone]. 06/26/2011 11:10 AM

## 2011-06-26 NOTE — Progress Notes (Signed)
Physical Therapy Session Note  Patient Details  Name: Aaron Long MRN: [id number] Date of Birth: Oct 02, 1940  Today's Date: 06/26/2011 Time: 9562-1308 Time Calculation (min): 61 min  Short Term Goals: Week 1:  PT Short Term Goal 1 (Week 1): =LTG PT Short Term Goal 1 - Progress (Week 1): Progressing toward goal  Skilled Therapeutic Interventions/Progress Updates:     Therapy Documentation Precautions:  Precautions Precautions: Fall Precaution Comments: L BKA Required Braces or Orthoses: Other Brace/Splint Other Brace/Splint: left limb guard, left residual limb wrapping Restrictions Weight Bearing Restrictions: No   Pain: Pain Assessment Pain Assessment: 0-10 Pain Score:   6 Pain Type: Surgical pain Pain Location: Leg Pain Orientation: Left;Distal Pain Descriptors: Throbbing Pain Frequency: Intermittent Pain Onset: Gradual Patients Stated Pain Goal: 0 Pain Intervention(s): RN made aware;Other (Comment) (pain meds given during session, rest breaks prn) Multiple Pain Sites: No    Treatments:  Dynamic gait training with RW 35 feet x 2 with min assist, focus on sequencing and RW position for increased safety and functional independence. Sit to squat position with 5 second holds and slow lowering to seated position 3x5 reps for increased functional strength and muscular endurance. Sit <-> stand x 5 with focus on increased anterior weight shift and right LE position for increased functional strength and independence. Standing left LE hip flexion, hip abduction, hip extension, hamstring curls and marches followed by right LE mini squats 3x5 each for increased functional strength and balance. Patient was very fatigued at end of session and pleased with his progress today.  See FIM for current functional status  Therapy/Group: Individual Therapy  [name] N 06/26/2011, 2:48 PM

## 2011-06-27 LAB — GLUCOSE, CAPILLARY
Glucose-Capillary: 111 mg/dL — ABNORMAL HIGH (ref 70–99)
Glucose-Capillary: 87 mg/dL (ref 70–99)
Glucose-Capillary: 148 mg/dL — ABNORMAL HIGH (ref 70–99)

## 2011-06-27 LAB — CBC
Hemoglobin: 9 g/dL — ABNORMAL LOW (ref 13.0–17.0)
MCH: 28.8 pg (ref 26.0–34.0)
MCHC: 31.5 g/dL (ref 30.0–36.0)
Platelets: 509 10*3/uL — ABNORMAL HIGH (ref 150–400)
RBC: 3.12 MIL/uL — ABNORMAL LOW (ref 4.22–5.81)

## 2011-06-27 LAB — HEPARIN LEVEL (UNFRACTIONATED): Heparin Unfractionated: 0.51 IU/mL (ref 0.30–0.70)

## 2011-06-27 LAB — PROTIME-INR
INR: 1.88 — ABNORMAL HIGH (ref 0.00–1.49)
Prothrombin Time: 21.9 seconds — ABNORMAL HIGH (ref 11.6–15.2)

## 2011-06-27 MED ORDER — WARFARIN SODIUM 10 MG PO TABS
10.0000 mg | ORAL_TABLET | Freq: Once | ORAL | Status: AC
  Administered 2011-06-27: 10 mg via ORAL
  Filled 2011-06-27: qty 1

## 2011-06-27 NOTE — Progress Notes (Signed)
ANTICOAGULATION CONSULT NOTE - Follow Up Consult  Pharmacy Consult for Heparin Indication: DVT  No Known Allergies  Patient Measurements: Weight: 214 lb 1.1 oz (97.1 kg) Heparin Dosing Weight: 92Kg (IBW=71Kg)  Vital Signs: Temp: 98.6 F (37 C) (06/16 0615) Temp src: Oral (06/16 0615) BP: 146/67 mmHg (06/16 0615) Pulse Rate: 81  (06/16 0615)  Labs:  [facility] 06/27/11 0550 06/26/11 0639 06/25/11 0635  HGB 9.0* 8.3* --  HCT 28.6* 26.2* 28.3*  PLT 509* 515* 505*  APTT -- -- --  LABPROT 21.9* 22.0* 19.3*  INR 1.88* 1.89* 1.60*  HEPARINUNFRC 0.51 0.47 0.44  CREATININE -- -- --  CKTOTAL -- -- --  CKMB -- -- --  TROPONINI -- -- --   Assessment: 71 yo with DVT s/p L BKA 06/16/11. Continues on IV heparin, bridging to Coumadin. Heparin level is therapeutic on 1650 units/hr.  INR increasing towards goal.  Noted to be on Cipro (since 6/10), which can increase effects of coumadin.  H/H and plts stable  Goal of Therapy:  INR 2-3 Heparin level 0.3-0.7 units/ml Monitor platelets by anticoagulation protocol: Yes   Plan:  - Continue heparin drip @ 1650 units/hr - Increase Coumadin to 10mg  today - Will f/up daily Hep level, CBC and INR- plan to d/c heparin when INR >2 for 48H - Please consider d/c Cipro soon  Mccrae Speciale K. Allena Katz, PharmD, BCPS.  Clinical Pharmacist Pager [phone]. 06/27/2011 7:54 AM

## 2011-06-27 NOTE — Progress Notes (Signed)
Patient ID: SAMULE LIFE, male   DOB: [birth year], 71 y.o.   MRN: [id number] Patient ID: ROLLYN SCIALDONE, male   DOB: 07-09-1940, 71 y.o.   MRN: [id number] Subjective/Complaints:   No problems this am. Slept well. Sugars improving No sob, no dysuria  Review of Systems  Genitourinary: Positive for urgency.  Musculoskeletal: Positive for joint pain.  Neurological: Positive for sensory change.  All other systems reviewed and are negative.    Objective: Vital Signs: Blood pressure 146/67, pulse 81, temperature 98.6 F (37 C), temperature source Oral, resp. rate 18, weight 97.1 kg (214 lb 1.1 oz), SpO2 95.00%. No results found. Results for orders placed during the hospital encounter of 06/21/11 (from the past 72 hour(s))  GLUCOSE, CAPILLARY     Status: Abnormal   Collection Time   06/24/11  7:44 AM      Component Value Range Comment   Glucose-Capillary 66 (*) 70 - 99 mg/dL    Comment 1 Notify RN     GLUCOSE, CAPILLARY     Status: Normal   Collection Time   06/24/11  8:20 AM      Component Value Range Comment   Glucose-Capillary 79  70 - 99 mg/dL    Comment 1 Notify RN     GLUCOSE, CAPILLARY     Status: Abnormal   Collection Time   06/24/11 12:01 PM      Component Value Range Comment   Glucose-Capillary 136 (*) 70 - 99 mg/dL    Comment 1 Notify RN     GLUCOSE, CAPILLARY     Status: Normal   Collection Time   06/24/11  4:54 PM      Component Value Range Comment   Glucose-Capillary 93  70 - 99 mg/dL    Comment 1 Notify RN     GLUCOSE, CAPILLARY     Status: Abnormal   Collection Time   06/24/11  8:57 PM      Component Value Range Comment   Glucose-Capillary 125 (*) 70 - 99 mg/dL    Comment 1 Notify RN     PROTIME-INR     Status: Abnormal   Collection Time   06/25/11  6:35 AM      Component Value Range Comment   Prothrombin Time 19.3 (*) 11.6 - 15.2 seconds    INR 1.60 (*) 0.00 - 1.49   CBC     Status: Abnormal   Collection Time   06/25/11  6:35 AM      Component Value  Range Comment   WBC 10.5  4.0 - 10.5 K/uL    RBC 3.12 (*) 4.22 - 5.81 MIL/uL    Hemoglobin 8.9 (*) 13.0 - 17.0 g/dL    HCT 78.2 (*) 95.6 - 52.0 %    MCV 90.7  78.0 - 100.0 fL    MCH 28.5  26.0 - 34.0 pg    MCHC 31.4  30.0 - 36.0 g/dL    RDW 21.3  08.6 - 57.8 %    Platelets 505 (*) 150 - 400 K/uL   HEPARIN LEVEL (UNFRACTIONATED)     Status: Normal   Collection Time   06/25/11  6:35 AM      Component Value Range Comment   Heparin Unfractionated 0.44  0.30 - 0.70 IU/mL   GLUCOSE, CAPILLARY     Status: Abnormal   Collection Time   06/25/11  7:14 AM      Component Value Range Comment   Glucose-Capillary 62 (*) 70 - 99  mg/dL    Comment 1 Notify RN     GLUCOSE, CAPILLARY     Status: Normal   Collection Time   06/25/11  7:53 AM      Component Value Range Comment   Glucose-Capillary 76  70 - 99 mg/dL    Comment 1 Notify RN     GLUCOSE, CAPILLARY     Status: Abnormal   Collection Time   06/25/11 11:19 AM      Component Value Range Comment   Glucose-Capillary 124 (*) 70 - 99 mg/dL    Comment 1 Notify RN     GLUCOSE, CAPILLARY     Status: Abnormal   Collection Time   06/25/11  4:30 PM      Component Value Range Comment   Glucose-Capillary 59 (*) 70 - 99 mg/dL    Comment 1 Notify RN     GLUCOSE, CAPILLARY     Status: Normal   Collection Time   06/25/11  5:04 PM      Component Value Range Comment   Glucose-Capillary 99  70 - 99 mg/dL    Comment 1 Notify RN     GLUCOSE, CAPILLARY     Status: Abnormal   Collection Time   06/25/11  9:48 PM      Component Value Range Comment   Glucose-Capillary 139 (*) 70 - 99 mg/dL   PROTIME-INR     Status: Abnormal   Collection Time   06/26/11  6:39 AM      Component Value Range Comment   Prothrombin Time 22.0 (*) 11.6 - 15.2 seconds    INR 1.89 (*) 0.00 - 1.49   CBC     Status: Abnormal   Collection Time   06/26/11  6:39 AM      Component Value Range Comment   WBC 9.3  4.0 - 10.5 K/uL    RBC 2.86 (*) 4.22 - 5.81 MIL/uL    Hemoglobin 8.3 (*) 13.0  - 17.0 g/dL    HCT 16.1 (*) 09.6 - 52.0 %    MCV 91.6  78.0 - 100.0 fL    MCH 29.0  26.0 - 34.0 pg    MCHC 31.7  30.0 - 36.0 g/dL    RDW 04.5  40.9 - 81.1 %    Platelets 515 (*) 150 - 400 K/uL   HEPARIN LEVEL (UNFRACTIONATED)     Status: Normal   Collection Time   06/26/11  6:39 AM      Component Value Range Comment   Heparin Unfractionated 0.47  0.30 - 0.70 IU/mL   GLUCOSE, CAPILLARY     Status: Abnormal   Collection Time   06/26/11  7:44 AM      Component Value Range Comment   Glucose-Capillary 132 (*) 70 - 99 mg/dL    Comment 1 Notify RN     GLUCOSE, CAPILLARY     Status: Normal   Collection Time   06/26/11 11:53 AM      Component Value Range Comment   Glucose-Capillary 90  70 - 99 mg/dL    Comment 1 Notify RN     GLUCOSE, CAPILLARY     Status: Normal   Collection Time   06/26/11  4:34 PM      Component Value Range Comment   Glucose-Capillary 74  70 - 99 mg/dL    Comment 1 Notify RN     GLUCOSE, CAPILLARY     Status: Abnormal   Collection Time   06/26/11  9:03 PM  Component Value Range Comment   Glucose-Capillary 117 (*) 70 - 99 mg/dL    Comment 1 Notify RN     PROTIME-INR     Status: Abnormal   Collection Time   06/27/11  5:50 AM      Component Value Range Comment   Prothrombin Time 21.9 (*) 11.6 - 15.2 seconds    INR 1.88 (*) 0.00 - 1.49   CBC     Status: Abnormal   Collection Time   06/27/11  5:50 AM      Component Value Range Comment   WBC 9.8  4.0 - 10.5 K/uL    RBC 3.12 (*) 4.22 - 5.81 MIL/uL    Hemoglobin 9.0 (*) 13.0 - 17.0 g/dL    HCT 46.9 (*) 62.9 - 52.0 %    MCV 91.7  78.0 - 100.0 fL    MCH 28.8  26.0 - 34.0 pg    MCHC 31.5  30.0 - 36.0 g/dL    RDW 52.8  41.3 - 24.4 %    Platelets 509 (*) 150 - 400 K/uL   HEPARIN LEVEL (UNFRACTIONATED)     Status: Normal   Collection Time   06/27/11  5:50 AM      Component Value Range Comment   Heparin Unfractionated 0.51  0.30 - 0.70 IU/mL      HEENT: normal Cardio: RRR Resp: CTA B/L GI: BS  positive Extremity:  Edema L stumpt distal Skin:   Other L stump staples intact Neuro: Alert/Oriented and Abnormal Sensory reduced lt touch R foot Musc/Skel:  Other L BKA minimal redness along tibial crest. Wound looks great-well approximated, no drainage Probably some short term memory deficits  Assessment/Plan: 1. Functional deficits secondary to L BKA due to PVD which require 3+ hours per day of interdisciplinary therapy in a comprehensive inpatient rehab setting. Physiatrist is providing close team supervision and 24 hour management of active medical problems listed below. Physiatrist and rehab team continue to assess barriers to discharge/monitor patient progress toward functional and medical goals.  I  FIM: FIM - Bathing Bathing Steps Patient Completed: Chest;Right Arm;Left Arm;Abdomen;Front perineal area;Right upper leg;Left upper leg;Right lower leg (including foot) (in w/c at sink) Bathing: 4: Min-Patient completes 8-9 40f 10 parts or 75+ percent  FIM - Upper Body Dressing/Undressing Upper body dressing/undressing steps patient completed: Thread/unthread right sleeve of pullover shirt/dresss;Thread/unthread left sleeve of pullover shirt/dress;Put head through opening of pull over shirt/dress;Pull shirt over trunk Upper body dressing/undressing: 5: Supervision: Safety issues/verbal cues FIM - Lower Body Dressing/Undressing Lower body dressing/undressing steps patient completed: Thread/unthread right underwear leg;Thread/unthread left underwear leg;Thread/unthread right pants leg;Thread/unthread left pants leg;Don/Doff right sock Lower body dressing/undressing: 3: Mod-Patient completed 50-74% of tasks  FIM - Toileting Toileting steps completed by patient: Adjust clothing prior to toileting Toileting Assistive Devices: Grab bar or rail for support Toileting: 0: Activity did not occur  FIM - Archivist Transfers: 0-Activity did not occur  FIM - Physiological scientist Devices: Environmental consultant;Arm rests Bed/Chair Transfer: 5: Supine > Sit: Supervision (verbal cues/safety issues);3: Bed > Chair or W/C: Mod A (lift or lower assist);4: Chair or W/C > Bed: Min A (steadying Pt. > 75%)  FIM - Locomotion: Wheelchair Distance: 20'  Locomotion: Wheelchair: 5: Travels 150 ft or more: maneuvers on rugs and over door sills with supervision, cueing or coaxing FIM - Locomotion: Ambulation Locomotion: Ambulation Assistive Devices: Designer, industrial/product Ambulation/Gait Assistance: 4: Min assist Locomotion: Ambulation: 1: Travels less than 50 ft with  minimal assistance (Pt.>75%)  Comprehension Comprehension Mode: Auditory Comprehension: 6-Follows complex conversation/direction: With extra time/assistive device  Expression Expression Mode: Verbal Expression: 6-Expresses complex ideas: With extra time/assistive device  Social Interaction Social Interaction: 6-Interacts appropriately with others with medication or extra time (anti-anxiety, antidepressant).  Problem Solving Problem Solving: 5-Solves basic problems: With no assist  Memory Memory: 5-Recognizes or recalls 90% of the time/requires cueing < 10% of the time  Medical Problem List and Plan:  1. left BKA secondary to PVD 06/16/2011  2. DVT Prophylaxis/Anticoagulation: DVT left popliteal vein. IV heparin/ Coumadin. Discontinue heparin once INR greater than 2.00. (still below 2 at present but approaching) 3. Pain Management: oxycodone as needed, Lyrica 100mg  bid-hold on further titration at present- try to avoid any neurosedating effects. 4. Wound culture positive MRSA. Contact precautions completed Zosyn.;  Persistent WBC elevation and sweating but afebrile and wound without drainage.  cipro on board- afebrile- Duration of rx? 5. Diabetes mellitus with neuropathy controlled. Hemoglobin A1c 7.9. Levemir 26 units twice a day. Check CBGs a.c. and at bedtime. Sugars still low despite snack.  Decreased levemir which has helped. 6. Hypertension uncontrolled. Norvasc 10 mg daily. Monitor with increased mobility  7. Hyperlipidemia. Zocor    LOS (Days) 6 A FACE TO FACE EVALUATION WAS PERFORMED  Loucile Posner T 06/27/2011, 6:56 AM

## 2011-06-27 NOTE — Progress Notes (Signed)
Occupational Therapy Note  Patient Details  Name: Aaron Long MRN: [id number] Date of Birth: 1940/01/16 Today's Date: 06/27/2011  Individual therapy Pain:  None Time:  1515-1600  (45 min)  Addressed functional mobility, and upper extremity exercises using the theraband Tied to bar.  Pt. Ambulated with RW but legs got weak and had to sit in wc.  Obtained shoe so second time he walked, he was able to maintain upright posture and did not get weak.  Performed UE exercises for increased independence with wc mobility and strength.  Pt reported he was tired at end of session.    Humberto Seals 06/27/2011, 6:30 PM

## 2011-06-28 LAB — GLUCOSE, CAPILLARY: Glucose-Capillary: 80 mg/dL (ref 70–99)

## 2011-06-28 LAB — CBC
Platelets: 520 10*3/uL — ABNORMAL HIGH (ref 150–400)
RBC: 3.19 MIL/uL — ABNORMAL LOW (ref 4.22–5.81)
RDW: 14.4 % (ref 11.5–15.5)
WBC: 9.3 10*3/uL (ref 4.0–10.5)

## 2011-06-28 LAB — PROTIME-INR: INR: 2.16 — ABNORMAL HIGH (ref 0.00–1.49)

## 2011-06-28 MED ORDER — WARFARIN SODIUM 10 MG PO TABS
10.0000 mg | ORAL_TABLET | Freq: Once | ORAL | Status: AC
  Administered 2011-06-28: 10 mg via ORAL
  Filled 2011-06-28: qty 1

## 2011-06-28 MED ORDER — WARFARIN VIDEO: Freq: Once | Status: DC

## 2011-06-28 NOTE — Progress Notes (Signed)
Physical Therapy Session Note  Patient Details  Name: Aaron Long MRN: [id number] Date of Birth: 19-Nov-1940  Today's Date: 06/28/2011 Time: 0900-1000 (60 min)  Short Term Goals: Week 1:  PT Short Term Goal 1 (Week 1): =LTG PT Short Term Goal 1 - Progress (Week 1): Progressing toward goal  Skilled Therapeutic Interventions/Progress Updates:    Pt reports working hard this weekend but feeling "blue" today.  Pt continues to be motivated throughout session however.  RN removed pt's PIV and wife present for session. Pt self-propelled w/c, managing w/c parts appropriately and safely but continues to need cues for how to remove leg rests.  Gait in RW x 41m (.39 ft/sec) with cues for RW placement and RLE step length. Educated wife and pt about using w/c around home primarily and RW for short distances to improve accessibility.  Pt performed LLE therex with therapist educating wife on all exercises and providing handout.  Wife demonstrates ability to appropriately cue husband for exercises and monitor for proper technique.  Measured pt for w/c to discharge home with- recommend 18x18 standard w/c with L amputee pad, basic cushion which was relayed to case manager.  Therapist was about to educate wife on how to bump pt and w/c up curb step when wife reporting not feeling well and left the gym.  Educated pt on his increased falls risks and only using RW around home when someone is present.  Pt agrees.  Pt self-propelled w/c back to room, continuing to require cues for increased push propulsion and shoulder ROM.  Rehab staff reports that wife was assisted down to ER for further evaluation.  Therapy Documentation Precautions:  Precautions Precautions: Fall Precaution Comments: L BKA Required Braces or Orthoses: Other Brace/Splint Other Brace/Splint: left limb guard, left residual limb wrapping Restrictions Weight Bearing Restrictions: No Pain: Pt reports 5/10 pain- premedicated.  See FIM for  current functional status  Therapy/Group: Individual Therapy  Deirdre Pippins 06/28/2011, 9:33 AM

## 2011-06-28 NOTE — Progress Notes (Signed)
Subjective/Complaints:   No complaints today other than leg being sore. No sob, no dysuria  Review of Systems  Genitourinary: Positive for urgency.  Musculoskeletal: Positive for joint pain.  Neurological: Positive for sensory change.  All other systems reviewed and are negative.    Objective: Vital Signs: Blood pressure 153/77, pulse 85, temperature 97.7 F (36.5 C), temperature source Oral, resp. rate 18, weight 97.1 kg (214 lb 1.1 oz), SpO2 95.00%. No results found. Results for orders placed during the hospital encounter of 06/21/11 (from the past 72 hour(s))  GLUCOSE, CAPILLARY     Status: Abnormal   Collection Time   06/25/11 11:19 AM      Component Value Range Comment   Glucose-Capillary 124 (*) 70 - 99 mg/dL    Comment 1 Notify RN     GLUCOSE, CAPILLARY     Status: Abnormal   Collection Time   06/25/11  4:30 PM      Component Value Range Comment   Glucose-Capillary 59 (*) 70 - 99 mg/dL    Comment 1 Notify RN     GLUCOSE, CAPILLARY     Status: Normal   Collection Time   06/25/11  5:04 PM      Component Value Range Comment   Glucose-Capillary 99  70 - 99 mg/dL    Comment 1 Notify RN     GLUCOSE, CAPILLARY     Status: Abnormal   Collection Time   06/25/11  9:48 PM      Component Value Range Comment   Glucose-Capillary 139 (*) 70 - 99 mg/dL   PROTIME-INR     Status: Abnormal   Collection Time   06/26/11  6:39 AM      Component Value Range Comment   Prothrombin Time 22.0 (*) 11.6 - 15.2 seconds    INR 1.89 (*) 0.00 - 1.49   CBC     Status: Abnormal   Collection Time   06/26/11  6:39 AM      Component Value Range Comment   WBC 9.3  4.0 - 10.5 K/uL    RBC 2.86 (*) 4.22 - 5.81 MIL/uL    Hemoglobin 8.3 (*) 13.0 - 17.0 g/dL    HCT 16.1 (*) 09.6 - 52.0 %    MCV 91.6  78.0 - 100.0 fL    MCH 29.0  26.0 - 34.0 pg    MCHC 31.7  30.0 - 36.0 g/dL    RDW 04.5  40.9 - 81.1 %    Platelets 515 (*) 150 - 400 K/uL   HEPARIN LEVEL (UNFRACTIONATED)     Status: Normal   Collection Time   06/26/11  6:39 AM      Component Value Range Comment   Heparin Unfractionated 0.47  0.30 - 0.70 IU/mL   GLUCOSE, CAPILLARY     Status: Abnormal   Collection Time   06/26/11  7:44 AM      Component Value Range Comment   Glucose-Capillary 132 (*) 70 - 99 mg/dL    Comment 1 Notify RN     GLUCOSE, CAPILLARY     Status: Normal   Collection Time   06/26/11 11:53 AM      Component Value Range Comment   Glucose-Capillary 90  70 - 99 mg/dL    Comment 1 Notify RN     GLUCOSE, CAPILLARY     Status: Normal   Collection Time   06/26/11  4:34 PM      Component Value Range Comment   Glucose-Capillary 74  70 - 99 mg/dL    Comment 1 Notify RN     GLUCOSE, CAPILLARY     Status: Abnormal   Collection Time   06/26/11  9:03 PM      Component Value Range Comment   Glucose-Capillary 117 (*) 70 - 99 mg/dL    Comment 1 Notify RN     PROTIME-INR     Status: Abnormal   Collection Time   06/27/11  5:50 AM      Component Value Range Comment   Prothrombin Time 21.9 (*) 11.6 - 15.2 seconds    INR 1.88 (*) 0.00 - 1.49   CBC     Status: Abnormal   Collection Time   06/27/11  5:50 AM      Component Value Range Comment   WBC 9.8  4.0 - 10.5 K/uL    RBC 3.12 (*) 4.22 - 5.81 MIL/uL    Hemoglobin 9.0 (*) 13.0 - 17.0 g/dL    HCT 16.1 (*) 09.6 - 52.0 %    MCV 91.7  78.0 - 100.0 fL    MCH 28.8  26.0 - 34.0 pg    MCHC 31.5  30.0 - 36.0 g/dL    RDW 04.5  40.9 - 81.1 %    Platelets 509 (*) 150 - 400 K/uL   HEPARIN LEVEL (UNFRACTIONATED)     Status: Normal   Collection Time   06/27/11  5:50 AM      Component Value Range Comment   Heparin Unfractionated 0.51  0.30 - 0.70 IU/mL   GLUCOSE, CAPILLARY     Status: Abnormal   Collection Time   06/27/11  7:38 AM      Component Value Range Comment   Glucose-Capillary 111 (*) 70 - 99 mg/dL    Comment 1 Notify RN     GLUCOSE, CAPILLARY     Status: Abnormal   Collection Time   06/27/11 11:21 AM      Component Value Range Comment   Glucose-Capillary 167  (*) 70 - 99 mg/dL    Comment 1 Notify RN     GLUCOSE, CAPILLARY     Status: Normal   Collection Time   06/27/11  4:15 PM      Component Value Range Comment   Glucose-Capillary 87  70 - 99 mg/dL    Comment 1 Notify RN     GLUCOSE, CAPILLARY     Status: Abnormal   Collection Time   06/27/11  9:14 PM      Component Value Range Comment   Glucose-Capillary 148 (*) 70 - 99 mg/dL    Comment 1 Notify RN     PROTIME-INR     Status: Abnormal   Collection Time   06/28/11  6:20 AM      Component Value Range Comment   Prothrombin Time 24.5 (*) 11.6 - 15.2 seconds    INR 2.16 (*) 0.00 - 1.49   CBC     Status: Abnormal   Collection Time   06/28/11  6:20 AM      Component Value Range Comment   WBC 9.3  4.0 - 10.5 K/uL    RBC 3.19 (*) 4.22 - 5.81 MIL/uL    Hemoglobin 9.2 (*) 13.0 - 17.0 g/dL    HCT 91.4 (*) 78.2 - 52.0 %    MCV 91.8  78.0 - 100.0 fL    MCH 28.8  26.0 - 34.0 pg    MCHC 31.4  30.0 - 36.0 g/dL    RDW  14.4  11.5 - 15.5 %    Platelets 520 (*) 150 - 400 K/uL   HEPARIN LEVEL (UNFRACTIONATED)     Status: Normal   Collection Time   06/28/11  6:20 AM      Component Value Range Comment   Heparin Unfractionated 0.58  0.30 - 0.70 IU/mL   GLUCOSE, CAPILLARY     Status: Abnormal   Collection Time   06/28/11  7:11 AM      Component Value Range Comment   Glucose-Capillary 110 (*) 70 - 99 mg/dL    Comment 1 Notify RN        HEENT: normal Cardio: RRR Resp: CTA B/L GI: BS positive Extremity:  Edema L stumpt distal Skin:   Other L stump staples intact Neuro: Alert/Oriented and Abnormal Sensory reduced lt touch R foot Musc/Skel:  Other L BKA minimal redness along tibial crest. Wound looks great-well approximated, no drainage. Leg appropriately tender.  short term memory deficits  Assessment/Plan: 1. Functional deficits secondary to L BKA due to PVD which require 3+ hours per day of interdisciplinary therapy in a comprehensive inpatient rehab setting. Physiatrist is providing close team  supervision and 24 hour management of active medical problems listed below. Physiatrist and rehab team continue to assess barriers to discharge/monitor patient progress toward functional and medical goals.  I  FIM: FIM - Bathing Bathing Steps Patient Completed: Chest;Right Arm;Left Arm;Abdomen;Front perineal area;Left upper leg;Right upper leg;Right lower leg (including foot) Bathing: 4: Min-Patient completes 8-9 65f 10 parts or 75+ percent  FIM - Upper Body Dressing/Undressing Upper body dressing/undressing steps patient completed: Thread/unthread right sleeve of pullover shirt/dresss;Thread/unthread left sleeve of pullover shirt/dress;Put head through opening of pull over shirt/dress;Pull shirt over trunk Upper body dressing/undressing: 5: Supervision: Safety issues/verbal cues FIM - Lower Body Dressing/Undressing Lower body dressing/undressing steps patient completed: Thread/unthread right pants leg;Thread/unthread left pants leg;Don/Doff right shoe;Don/Doff right sock Lower body dressing/undressing: 4: Min-Patient completed 75 plus % of tasks  FIM - Toileting Toileting steps completed by patient: Adjust clothing prior to toileting Toileting Assistive Devices: Grab bar or rail for support Toileting: 0: Activity did not occur  FIM - Archivist Transfers: 0-Activity did not occur  FIM - Banker Devices: Environmental consultant;Arm rests Bed/Chair Transfer: 5: Chair or W/C > Bed: Supervision (verbal cues/safety issues);5: Bed > Chair or W/C: Supervision (verbal cues/safety issues)  FIM - Locomotion: Wheelchair Distance: 20'  Locomotion: Wheelchair: 5: Travels 150 ft or more: maneuvers on rugs and over door sills with supervision, cueing or coaxing FIM - Locomotion: Ambulation Locomotion: Ambulation Assistive Devices: Designer, industrial/product Ambulation/Gait Assistance: 4: Min assist Locomotion: Ambulation: 1: Travels less than 50 ft with minimal  assistance (Pt.>75%)  Comprehension Comprehension Mode: Auditory Comprehension: 6-Follows complex conversation/direction: With extra time/assistive device  Expression Expression Mode: Verbal Expression: 6-Expresses complex ideas: With extra time/assistive device  Social Interaction Social Interaction: 6-Interacts appropriately with others with medication or extra time (anti-anxiety, antidepressant).  Problem Solving Problem Solving: 5-Solves basic problems: With no assist  Memory Memory: 5-Recognizes or recalls 90% of the time/requires cueing < 10% of the time  Medical Problem List and Plan:  1. left BKA secondary to PVD 06/16/2011  2. DVT Prophylaxis/Anticoagulation: DVT left popliteal vein. Coumadin therapeutic now. 3. Pain Management: oxycodone as needed, Lyrica 100mg  bid-hold on further titration at present- try to avoid any neurosedating effects. 4. Wound culture positive MRSA. Contact precautions completed Zosyn.;  afebrile and wound without drainage.  cipro on board- afebrile- dc  and observe clinically. 5. Diabetes mellitus with neuropathy controlled. Hemoglobin A1c 7.9. Levemir 26 units twice a day. Check CBGs a.c. and at bedtime. Decreased levemir which has helped. 6. Hypertension uncontrolled. Norvasc 10 mg daily. Monitor with increased mobility  7. Hyperlipidemia. Zocor    LOS (Days) 7 A FACE TO FACE EVALUATION WAS PERFORMED  Aaron Long T 06/28/2011, 8:13 AM

## 2011-06-28 NOTE — Progress Notes (Signed)
Physical Therapy Session Note  Patient Details  Name: Aaron Long MRN: [id number] Date of Birth: 1940-05-30  Today's Date: 06/28/2011 Time: 1345-1430 Time Calculation (min): 45 min  Short Term Goals: Week 1:  PT Short Term Goal 1 (Week 1): =LTG PT Short Term Goal 1 - Progress (Week 1): Progressing toward goal  Skilled Therapeutic Interventions/Progress Updates:    Pt was very distraught and distracted at beginning of session- concerned for his wife who is in the ER.  Pt reports that he has not heard much about her status and is too worried with that to participate in PT.  Therapist tried to help patient reach out to ER, contacting him with the hospital operator.  Pt's son called the pt's room to update them that his wife was still in the ER.  Pt felt better after hearing from son and self-propelled w/c down to gym. Pt required assist with donning w/c leg rests and amp pad, becoming frustrated when he could not get it himself.  Pt will benefit from independently managing w/c parts and receiving education as needed. Pt exercised on UE ergometer x 12' at level 1.8 resistance for increased cardiorespiratory fitness, endurance, and UE strengthening.  Pt reported 7/10 exertion and fatigue following exercise.  Pt returned to room with call bell and phone in reach hoping to hear from his family regarding his wife's status.  Therapy Documentation Precautions:  Precautions Precautions: Fall Precaution Comments: L BKA Required Braces or Orthoses: Other Brace/Splint Other Brace/Splint: left limb guard, left residual limb wrapping Restrictions Weight Bearing Restrictions: No  Pain: No c/o pain.  See FIM for current functional status  Therapy/Group: Individual Therapy  Deirdre Pippins 06/28/2011, 3:55 PM

## 2011-06-28 NOTE — Progress Notes (Signed)
Occupational Therapy Session Notes  Patient Details  Name: LARSON LIMONES MRN: [id number] Date of Birth: 09/23/1940  Today's Date: 06/28/2011  Short Term Goals: Week 1:  OT Short Term Goal 1 (Week 1): Short Term Goals = Long Term Goals  Skilled Therapeutic Interventions/Progress Updates:   Session #1 [phone] - 2 Minutes Individual Therapy No complaints of pain except at end of session patient asked RN for pain meds Upon entering room patient supine in bed. Engaged in bed mobility for dynamic sitting balance for ~5 minutes while finishing a drink. Patient then performed edge of bed -> w/c transfer with supervision for ADL retraining at sink level. Focused skilled intervention on sit -> stands, dynamic standing balance/tolerance/endurance, UB/LB bathing & dressing, inspection of right foot using inspection mirror, and education to patient and wife regarding residual limb compression wrapping. Previously gave patients wife a handout on compression wrapping and at the time she refused to try it. Today therapist educated, demonstrated, and had patient's wife return demonstrate compression wrapping. Plan to continue education in next session. Left patient with RN present in room.   Session #2 Patient missed 30 minutes of skilled occupational therapy secondary to patient off unit. Per NT, patient down in emergency department with wife who was recently admitted.   Precautions:  Precautions Precautions: Fall Precaution Comments: L BKA Required Braces or Orthoses: Other Brace/Splint Other Brace/Splint: left limb guard, left residual limb wrapping Restrictions Weight Bearing Restrictions: No  See FIM for current functional status  Trevia Nop 06/28/2011, 8:32 AM

## 2011-06-28 NOTE — Progress Notes (Signed)
Reviewed and in agreement with treatment provided.  

## 2011-06-28 NOTE — Progress Notes (Signed)
ANTICOAGULATION CONSULT NOTE - Follow Up Consult  Pharmacy Consult for Coumadin Indication: DVT  No Known Allergies  Patient Measurements: Weight: 214 lb 1.1 oz (97.1 kg) Heparin Dosing Weight: 92Kg (IBW=71Kg)  Vital Signs: Temp: 97.7 F (36.5 C) (06/17 0530) Temp src: Oral (06/17 0530) BP: 153/77 mmHg (06/17 0530) Pulse Rate: 85  (06/17 0530)  Labs:  Basename 06/28/11 0620 06/27/11 0550 06/26/11 0639  HGB 9.2* 9.0* --  HCT 29.3* 28.6* 26.2*  PLT 520* 509* 515*  APTT -- -- --  LABPROT 24.5* 21.9* 22.0*  INR 2.16* 1.88* 1.89*  HEPARINUNFRC 0.58 0.51 0.47  CREATININE -- -- --  CKTOTAL -- -- --  CKMB -- -- --  TROPONINI -- -- --   Assessment: Admitted 06/10/2011 with dry gangrene of left fifth toe. Underwent amputation of left fifth toe with debridement 06/14/2011. Underwent L BKA 06/16/2011 d/t progressive ischemic changes. Venous Doppler study on 06/19/2011 showed DVT left popliteal vein.  Anticoagulation: Heparin d/c'd today (6 days overlap). Paged Dr. Riley Kill that INR has not been >2 x 48 hrs but he desires to stop heparin anyway. INR in goal range 2.16. Hgb improved to 9.2  Infectious Disease: s/p 8d course of Vanc/Zosyn for MRSA wound infection. Afeb, WBC stable. Cipro d/c  Cardiovascular: Hx HTN, CAD, HLD . BP elevated 153/77 with HR 85. Meds: on home amlodipine, zocor (pravachol PTA), ASA 81mg , Cardura  Endocrinology: Hx DM on home Levemir (26 units BID PTA) - now 23 bid, CBG 87-167  Gastrointestinal / Nutrition: LFTs WNL, CHO mod diet, po PPI  Neurology: Hx of diabetic neuropathy- on home pregbalin  Nephrology: SCr trending down, still up at 2.53; admit Scr ~3. Lytes are WNL  Pulmonary: RA  Hematology / Oncology: Hgb improved to 9.2 CBC stable   PTA Medication Issues: Home meds resumed  Goal of Therapy:  INR 2-3 Heparin level 0.3-0.7 units/ml Monitor platelets by anticoagulation protocol: Yes   Plan:  MD d/c'd IV heparin Coumadin 10mg  po x 1 more  day  06/28/2011 11:06 AM

## 2011-06-28 NOTE — Progress Notes (Signed)
Social Work Patient ID: Aaron Long, male   DOB: [birth year], 71 y.o.   MRN: [id number] Pt requires a hospital bed to elevate his residual limb and upper body due to his vascular disease. He is not able to use pillows to elevate himself.  Wife is here for family education today.

## 2011-06-29 LAB — GLUCOSE, CAPILLARY
Glucose-Capillary: 120 mg/dL — ABNORMAL HIGH (ref 70–99)
Glucose-Capillary: 174 mg/dL — ABNORMAL HIGH (ref 70–99)
Glucose-Capillary: 84 mg/dL (ref 70–99)
Glucose-Capillary: 122 mg/dL — ABNORMAL HIGH (ref 70–99)

## 2011-06-29 LAB — PROTIME-INR
INR: 2.21 — ABNORMAL HIGH (ref 0.00–1.49)
Prothrombin Time: 24.9 seconds — ABNORMAL HIGH (ref 11.6–15.2)

## 2011-06-29 MED ORDER — WARFARIN SODIUM 7.5 MG PO TABS
7.5000 mg | ORAL_TABLET | Freq: Every day | ORAL | Status: DC
  Administered 2011-06-29: 7.5 mg via ORAL
  Filled 2011-06-29 (×2): qty 1

## 2011-06-29 NOTE — Progress Notes (Signed)
ANTICOAGULATION CONSULT NOTE - Follow Up Consult  Pharmacy Consult for Coumadin Indication: DVT  No Known Allergies  Patient Measurements: Weight: 214 lb 1.1 oz (97.1 kg) Heparin Dosing Weight: 92Kg (IBW=71Kg)  Vital Signs: Temp: 99.2 F (37.3 C) (06/18 0549) Temp src: Oral (06/18 0549) BP: 150/60 mmHg (06/18 0549) Pulse Rate: 83  (06/18 0549)  Labs:  Basename 06/29/11 0500 06/28/11 0620 06/27/11 0550  HGB -- 9.2* 9.0*  HCT -- 29.3* 28.6*  PLT -- 520* 509*  APTT -- -- --  LABPROT 24.9* 24.5* 21.9*  INR 2.21* 2.16* 1.88*  HEPARINUNFRC -- 0.58 0.51  CREATININE -- -- --  CKTOTAL -- -- --  CKMB -- -- --  TROPONINI -- -- --   Assessment: Admitted 06/10/2011 with dry gangrene of left fifth toe. Underwent amputation of left fifth toe + debridement 06/14/2011. Underwent L BKA 06/16/2011 d/t progressive ischemic changes. Venous Doppler study on 06/19/2011 showed DVT left popliteal vein.  Anticoagulation: Heparin d/c'd 6/17 after 6 days overlap. INR 2.21 in goal range. Hgb improved to 9.2. No bleeding noted. Coumadin teaching done 6/17. Patient may require some reinforcement of information as he was concerned about his wife that has to go to ER that day after helping him in the gym.  Goal of Therapy:  INR 2-3 Monitor platelets by anticoagulation protocol: Yes   Plan:  Try Coumadin 7.5mg  daily for maintenance.   Beatrice Ziehm S. Merilynn Finland, PharmD, [facility] Clinical Staff Pharmacist Pager [phone]  06/29/2011 7:26 AM

## 2011-06-29 NOTE — Progress Notes (Signed)
Social Work Patient ID: Aaron Long, male   DOB: 12-19-1940, 71 y.o.   MRN: [id number] Pt requires a lightweight wheelchair to self propel.  He is not able to self propel in a standard wheelchair. He also utilizes it for his self care.  Wife to come in tomorrow for family education, since unable to do  Yesterday due to wife's health issue.  Work toward discharge Thurs. If education goes well and wife feeling better.

## 2011-06-29 NOTE — Discharge Summary (Signed)
  Discharge summary job [id number]

## 2011-06-29 NOTE — Progress Notes (Signed)
Alert and orientated x 4. Remains continent of bowel and bladder. Last bowel movement 06/28/11, per pt's report. Independent with urinal. Requires staff to remove and empty. Bilateral buttock with dry, flaky skin. Scabbed area to R buttock, allevyn dressing applied to area. Limb guard to R AKA. Calls appropriately for assistance. Pain managed with Oxy IR 10mg  q 4hrs, prn.

## 2011-06-29 NOTE — Progress Notes (Signed)
Patient ID: Aaron Long, male   DOB: Jul 25, 1940, 71 y.o.   MRN: [id number] Subjective/Complaints:  Pain under control. Slept well.  No sob, no dysuria  Review of Systems  Genitourinary: Positive for urgency.  Musculoskeletal: Positive for joint pain.  Neurological: Positive for sensory change.  All other systems reviewed and are negative.    Objective: Vital Signs: Blood pressure 150/60, pulse 83, temperature 99.2 F (37.3 C), temperature source Oral, resp. rate 19, weight 97.1 kg (214 lb 1.1 oz), SpO2 96.00%. No results found. Results for orders placed during the hospital encounter of 06/21/11 (from the past 72 hour(s))  GLUCOSE, CAPILLARY     Status: Abnormal   Collection Time   06/26/11  7:44 AM      Component Value Range Comment   Glucose-Capillary 132 (*) 70 - 99 mg/dL    Comment 1 Notify RN     GLUCOSE, CAPILLARY     Status: Normal   Collection Time   06/26/11 11:53 AM      Component Value Range Comment   Glucose-Capillary 90  70 - 99 mg/dL    Comment 1 Notify RN     GLUCOSE, CAPILLARY     Status: Normal   Collection Time   06/26/11  4:34 PM      Component Value Range Comment   Glucose-Capillary 74  70 - 99 mg/dL    Comment 1 Notify RN     GLUCOSE, CAPILLARY     Status: Abnormal   Collection Time   06/26/11  9:03 PM      Component Value Range Comment   Glucose-Capillary 117 (*) 70 - 99 mg/dL    Comment 1 Notify RN     PROTIME-INR     Status: Abnormal   Collection Time   06/27/11  5:50 AM      Component Value Range Comment   Prothrombin Time 21.9 (*) 11.6 - 15.2 seconds    INR 1.88 (*) 0.00 - 1.49   CBC     Status: Abnormal   Collection Time   06/27/11  5:50 AM      Component Value Range Comment   WBC 9.8  4.0 - 10.5 K/uL    RBC 3.12 (*) 4.22 - 5.81 MIL/uL    Hemoglobin 9.0 (*) 13.0 - 17.0 g/dL    HCT 56.2 (*) 13.0 - 52.0 %    MCV 91.7  78.0 - 100.0 fL    MCH 28.8  26.0 - 34.0 pg    MCHC 31.5  30.0 - 36.0 g/dL    RDW 86.5  78.4 - 69.6 %    Platelets  509 (*) 150 - 400 K/uL   HEPARIN LEVEL (UNFRACTIONATED)     Status: Normal   Collection Time   06/27/11  5:50 AM      Component Value Range Comment   Heparin Unfractionated 0.51  0.30 - 0.70 IU/mL   GLUCOSE, CAPILLARY     Status: Abnormal   Collection Time   06/27/11  7:38 AM      Component Value Range Comment   Glucose-Capillary 111 (*) 70 - 99 mg/dL    Comment 1 Notify RN     GLUCOSE, CAPILLARY     Status: Abnormal   Collection Time   06/27/11 11:21 AM      Component Value Range Comment   Glucose-Capillary 167 (*) 70 - 99 mg/dL    Comment 1 Notify RN     GLUCOSE, CAPILLARY     Status: Normal  Collection Time   06/27/11  4:15 PM      Component Value Range Comment   Glucose-Capillary 87  70 - 99 mg/dL    Comment 1 Notify RN     GLUCOSE, CAPILLARY     Status: Abnormal   Collection Time   06/27/11  9:14 PM      Component Value Range Comment   Glucose-Capillary 148 (*) 70 - 99 mg/dL    Comment 1 Notify RN     PROTIME-INR     Status: Abnormal   Collection Time   06/28/11  6:20 AM      Component Value Range Comment   Prothrombin Time 24.5 (*) 11.6 - 15.2 seconds    INR 2.16 (*) 0.00 - 1.49   CBC     Status: Abnormal   Collection Time   06/28/11  6:20 AM      Component Value Range Comment   WBC 9.3  4.0 - 10.5 K/uL    RBC 3.19 (*) 4.22 - 5.81 MIL/uL    Hemoglobin 9.2 (*) 13.0 - 17.0 g/dL    HCT 16.1 (*) 09.6 - 52.0 %    MCV 91.8  78.0 - 100.0 fL    MCH 28.8  26.0 - 34.0 pg    MCHC 31.4  30.0 - 36.0 g/dL    RDW 04.5  40.9 - 81.1 %    Platelets 520 (*) 150 - 400 K/uL   HEPARIN LEVEL (UNFRACTIONATED)     Status: Normal   Collection Time   06/28/11  6:20 AM      Component Value Range Comment   Heparin Unfractionated 0.58  0.30 - 0.70 IU/mL   GLUCOSE, CAPILLARY     Status: Abnormal   Collection Time   06/28/11  7:11 AM      Component Value Range Comment   Glucose-Capillary 110 (*) 70 - 99 mg/dL    Comment 1 Notify RN     GLUCOSE, CAPILLARY     Status: Abnormal   Collection  Time   06/28/11 11:17 AM      Component Value Range Comment   Glucose-Capillary 189 (*) 70 - 99 mg/dL    Comment 1 Notify RN     GLUCOSE, CAPILLARY     Status: Normal   Collection Time   06/28/11  4:29 PM      Component Value Range Comment   Glucose-Capillary 80  70 - 99 mg/dL    Comment 1 Notify RN      Comment 2 Documented in Chart     PROTIME-INR     Status: Abnormal   Collection Time   06/29/11  5:00 AM      Component Value Range Comment   Prothrombin Time 24.9 (*) 11.6 - 15.2 seconds    INR 2.21 (*) 0.00 - 1.49      HEENT: normal Cardio: RRR Resp: CTA B/L GI: BS positive Extremity:  Edema L stumpt distal Skin:   Other L stump staples intact Neuro: Alert/Oriented and Abnormal Sensory reduced lt touch R foot Musc/Skel:  Other L BKA skin intact, no change in redness around wound. Large dog ears on stump. Wound looks great-well approximated, no drainage. Leg appropriately tender.  short term memory deficits  Assessment/Plan: 1. Functional deficits secondary to L BKA due to PVD which require 3+ hours per day of interdisciplinary therapy in a comprehensive inpatient rehab setting. Physiatrist is providing close team supervision and 24 hour management of active medical problems listed below. Physiatrist and  rehab team continue to assess barriers to discharge/monitor patient progress toward functional and medical goals.  I  FIM: FIM - Bathing Bathing Steps Patient Completed: Chest;Right Arm;Left Arm;Abdomen;Front perineal area;Buttocks;Right upper leg;Left upper leg;Right lower leg (including foot) Bathing: 4: Steadying assist  FIM - Upper Body Dressing/Undressing Upper body dressing/undressing steps patient completed: Thread/unthread right sleeve of pullover shirt/dresss;Thread/unthread left sleeve of pullover shirt/dress;Put head through opening of pull over shirt/dress;Pull shirt over trunk Upper body dressing/undressing: 5: Supervision: Safety issues/verbal cues FIM - Lower  Body Dressing/Undressing Lower body dressing/undressing steps patient completed: Thread/unthread right underwear leg;Thread/unthread left underwear leg;Pull underwear up/down;Thread/unthread right pants leg;Thread/unthread left pants leg;Pull pants up/down;Don/Doff right sock;Don/Doff right shoe;Fasten/unfasten right shoe Lower body dressing/undressing: 4: Steadying Assist  FIM - Toileting Toileting steps completed by patient: Adjust clothing prior to toileting;Performs perineal hygiene;Adjust clothing after toileting Toileting Assistive Devices: Grab bar or rail for support Toileting: 5: Supervision: Safety issues/verbal cues  FIM - Archivist Transfers: 0-Activity did not occur  FIM - Banker Devices: Bed rails;HOB elevated Bed/Chair Transfer: 5: Supine > Sit: Supervision (verbal cues/safety issues);5: Sit > Supine: Supervision (verbal cues/safety issues);5: Bed > Chair or W/C: Supervision (verbal cues/safety issues);5: Chair or W/C > Bed: Supervision (verbal cues/safety issues)  FIM - Locomotion: Wheelchair Distance: 20'  Locomotion: Wheelchair: 5: Travels 150 ft or more: maneuvers on rugs and over door sills with supervision, cueing or coaxing FIM - Locomotion: Ambulation Locomotion: Ambulation Assistive Devices: Designer, industrial/product Ambulation/Gait Assistance: 4: Min assist Locomotion: Ambulation: 1: Travels less than 50 ft with minimal assistance (Pt.>75%)  Comprehension Comprehension Mode: Auditory Comprehension: 7-Follows complex conversation/direction: With no assist  Expression Expression Mode: Verbal Expression: 7-Expresses complex ideas: With no assist  Social Interaction Social Interaction: 7-Interacts appropriately with others - No medications needed.  Problem Solving Problem Solving: 6-Solves complex problems: With extra time  Memory Memory: 7-Complete Independence: No helper  Medical Problem List and Plan:  1.  left BKA secondary to PVD 06/16/2011  2. DVT Prophylaxis/Anticoagulation: DVT left popliteal vein. Coumadin therapeutic now. 3. Pain Management: oxycodone as needed, Lyrica 100mg  bid-hold on further titration at present- try to avoid any neurosedating effects. 4. Wound culture positive MRSA. Contact precautions completed Zosyn.;  afebrile and wound without drainage.  cipro on board- afebrile- dc and observe clinically. Wound looks excellent 5. Diabetes mellitus with neuropathy controlled. Hemoglobin A1c 7.9. Levemir 26 units twice a day. Check CBGs a.c. and at bedtime. Decreased levemir which has helped to stabilize sugars. 6. Hypertension uncontrolled. Norvasc 10 mg daily. Monitor with increased mobility  7. Hyperlipidemia. Zocor    LOS (Days) 8 A FACE TO FACE EVALUATION WAS PERFORMED  Analina Filla T 06/29/2011, 7:21 AM

## 2011-06-29 NOTE — Discharge Summary (Signed)
Aaron Long, Aaron Long NO.:  [id number]  MEDICAL RECORD NO.:  [id number]  LOCATION:  4005                         FACILITY:  [facility]  PHYSICIAN:  [name], M.D.DATE OF BIRTH:  Jan 31, 1940  DATE OF ADMISSION:  06/21/2011 DATE OF DISCHARGE:  06/30/2011                              DISCHARGE SUMMARY   DISCHARGE DIAGNOSES: 1. Left below-knee amputation secondary to peripheral vascular     disease. 2. Left popliteal vein deep vein thrombosis with Coumadin therapy. 3. Pain management. 4. Wound culture positive methicillin-resistant staphylococcus aureus     with contact precautions. 5. Diabetes mellitus. 6. Hypertension. 7. Hyperlipidemia.  This is a 71 year old right-handed male with history of diabetes mellitus, peripheral vascular disease, admitted Jun 10, 2011 with dry gangrene of left fifth toe.  Recent aortogram with 50% SFA, occluded popliteal, and one-vessel runoff via posterior tibial artery.  Initially underwent amputation of left fifth toe with debridement on June 14, 2011, with progressive ischemic changes, low-grade fever of 102, and ultimately underwent left below-knee amputation June 16, 2011, per Dr. Darrick Penna.  Placed on subcutaneous heparin for DVT prophylaxis.  A limb guard was placed.  Venous Doppler study June 19, 2011, showed DVT left popliteal vein.  Placed on Coumadin therapy.  Wound cultures left foot showed MRSA, placed on contact precautions.  Blood sugars were monitored with hemoglobin A1c of 7.9.  The patient was admitted for comprehensive rehab program.  PAST MEDICAL HISTORY:  See discharge diagnoses.  SOCIAL HISTORY:  Lives with wife and assistance as needed.  FUNCTIONAL HISTORY:  Prior to admission used wheelchair assistive device.  FUNCTIONAL STATUS:  Upon admission to rehab services was moderate to total assist for transfers and mobility.  PHYSICAL EXAMINATION:  VITAL SIGNS:  Blood pressure 138/61, pulse 79, respirations  18, temperature 98.6. GENERAL:  This is an alert male, oriented x3. LUNGS:  Clear to auscultation. CARDIAC:  Regular rate and rhythm. ABDOMEN:  Soft, nontender.  Good bowel sounds. EXTREMITIES:  Left below-knee amputation site dog eared stump with 2+ edema, mild ecchymoses.  Motor strength 4/5 upper extremities.  REHABILITATION HOSPITAL COURSE:  The patient was admitted to inpatient rehab services with therapies initiated on a 3-hour daily basis consisting of physical therapy, occupational therapy, and rehabilitation nursing.  The following issues were addressed during the patient's rehabilitation stay.  Pertaining to Mr. Tempesta's left below-knee amputation secondary to peripheral vascular disease, June 16, 2011, surgical site healing nicely with plan to follow up Dr. Darrick Penna of Vascular Surgery.  The patient maintained on Coumadin therapy for findings of left popliteal vein DVT per venous Doppler studies on June 19, 2011, with latest INR of 2.21.  The patient would be followed by Dr. Lambert Keto of [place], [place] at [phone] on discharge.  Pain management ongoing with the use of oxycodone as needed as well as Lyrica 100 mg twice daily.  He remained on contact precautions for MRSA of the left foot.  His diabetes mellitus monitored with hemoglobin A1c of 7.9. He is currently on Levemir 20 units subcutaneously daily as well as insulin aspart.  His blood pressure is monitored with Norvasc and Cardura with no orthostatic changes.  The patient received  weekly collaborative interdisciplinary team conferences to discuss estimated length of stay, family teaching, and any barriers to discharge.  He was minimal assist for upper body activities of daily living and total assist for lower body, minimum to moderate assist, wheelchair level for functional mobility.  Full family teaching was completed and plan is to be discharged to home on June 30, 2011, with home therapies as advised. The  patient was to transition his medical care to Dr. Glade Lloyd of [facility] in [place] in the future, however, he would continued to be followed by Dr. Marquis Buggy of [place], [place] at this time.  DISCHARGE MEDICATIONS:  At the time of dictation included, 1. Tylenol as needed. 2. Norvasc 10 mg daily. 3. Aspirin 81 mg daily. 4. Cardura 1 mg at bedtime. 5. Levemir 15 units at bedtime and 20 units a.m. 6. Oxycodone 5-10 mg every 4 hours as needed for pain. 7. Protonix 40 mg daily. 8. Lyrica 100 mg twice daily. 9. Zocor 5 mg daily. 10.Coumadin 7.5 mg daily adjusted accordingly for an INR of 2.0 to     3.0.  DIET:  Diabetic diet.  SPECIAL INSTRUCTIONS:  Home health nurse to check INR on Friday, June 21st with results to Dr. Lambert Keto at [phone], fax number [phone].  The patient should follow up with Dr. Darrick Penna of Vascular Surgery in 1-2 weeks, call for appointment, Dr. Daleen Bo at the outpatient rehab service office on July 27, 2011, and Dr. Glade Lloyd at [facility] July 21, 2011, for medical management if the patient does moved to [place], [place] which is yet to be established.     Mariam Dollar, P.A.   ______________________________ [name], M.D.    DA/MEDQ  D:  06/29/2011  T:  06/29/2011  Job:  [id number]  cc:   Janetta Hora. Darrick Penna, MD Oneal Grout, MD Lambert Keto, MD

## 2011-06-29 NOTE — Progress Notes (Signed)
Reviewed and in agreement with treatment provided.  

## 2011-06-29 NOTE — Progress Notes (Signed)
Occupational Therapy Session Notes  Patient Details  Name: BRADFORD CAZIER MRN: [id number] Date of Birth: 1940-11-03  Today's Date: 06/29/2011  Short Term Goals: Week 1:  OT Short Term Goal 1 (Week 1): Short Term Goals = Long Term Goals  Skilled Therapeutic Interventions/Progress Updates:   Session #1 [phone] - 50 Minutes Individual Therapy No complaints of pain Upon entering room patient seated edge of bed finishing up breakfast. Patient initially with complaints of feeling "weaker" this am. Patient required min assist for edge of bed -> w/c transfer (patient has been consistent with supervision bed transfers). Patient then engaged in ADL retraining & grooming tasks at sink level at overall supervision level except needed steady assist when pulling pants up to waist. Patient performed inspection to right foot using inspection mirror and problem solved on safest way to donn sock; transferred back to bed to donn sock sitting edge of bed. Patient then requested to stay in bed until next therapy session, left patient with call bell and phone within reach.   Session #2 1130-1200 - 30 Minutes Individual Therapy No complaints of pain Focused skilled intervention on drop arm BSC transfer and tub/shower transfers using tub transfer bench in tub/shower combination. Patient assisted -> ADL apartment for transfers. Patient required supervision and needed assistance with w/c management during both tranfers. Family needs to be present for family education prior to discharge home.   Precautions:  Precautions Precautions: Fall Precaution Comments: L BKA Required Braces or Orthoses: Other Brace/Splint Other Brace/Splint: left limb guard, left residual limb wrapping Restrictions Weight Bearing Restrictions: No  See FIM for current functional status  Sumner Kirchman 06/29/2011, 9:39 AM

## 2011-06-29 NOTE — Patient Care Conference (Signed)
Inpatient RehabilitationTeam Conference Note Date: 06/29/2011   Time: 2:10 PM    Patient Name: Aaron Long      Medical Record Number: [id number]  Date of Birth: 1940-02-18 Sex: Male         Room/Bed: 4005/4005-01 Payor Info: Payor: MEDICARE  Plan: MEDICARE PART A AND B  Product Type: *No Product type*     Admitting Diagnosis: L BKA  Admit Date/Time:  06/21/2011  3:49 PM Admission Comments: No comment available   Primary Diagnosis:  S/P BKA (below knee amputation) Principal Problem: S/P BKA (below knee amputation)  Patient Active Problem List   Diagnosis Date Noted  . S/P BKA (below knee amputation) 06/22/2011  . Atherosclerosis of native arteries of the extremities with gangrene 06/10/2011  . Pre-operative cardiovascular examination 06/01/2011  . Diabetic toe ulcer 05/20/2011  . Acute kidney injury 05/02/2011  . CKD (chronic kidney disease) stage 3, GFR 30-59 ml/min 05/02/2011  . Cellulitis 05/02/2011  . Gangrene due to secondary diabetes mellitus 04/29/2011  . Diabetic foot ulcer associated with type 2 diabetes mellitus 04/28/2011  . HTN (hypertension) 04/28/2011  . Diabetes mellitus type 2 with complications 04/28/2011  . CAD (coronary artery disease) 04/28/2011  . PAD (peripheral artery disease) 04/28/2011  . Prostate cancer 04/28/2011    Expected Discharge Date: Expected Discharge Date: 07/01/11  Team Members Present: Physician: Dr. Faith Rogue Case Manager Present: Melanee Spry, RN Social Worker Present: Dossie Der, LCSW Nurse Present: Daryll Brod, RN PT Present: Karolee Stamps, PT;Becky Henrene Dodge, PT;Other (comment) Sherrine Maples) OT Present: Edwin Cap, OT;Ardis Rowan, COTA Other (Discipline and Name): Tora Duck, PPS Coordinator     Current Status/Progress Goal Weekly Team Focus  Medical   inr therapeutic. pain control better.   pain control, safety      Bowel/Bladder   patient incontinent of bowel and bladder at times  continent of bowel and  bladder  timed toileting and monitor for continence   Swallow/Nutrition/ Hydration             ADL's   overall supervision except steady assist for pulling pants up to waist  overall supervision  dynamic standing balance/tolerance/endurance, sit/stands, family ed, & D/C planning   Mobility   Supervision to Mod I at w/c level; Min A with short distance amb with RW  Mod I w/c level; Min A for short distance amb with RW  balance training, gait training, w/c management, activity tolerance, endurance, strengthening, family edu,   Communication             Safety/Cognition/ Behavioral Observations            Pain   Pain managed with PRN medication  patient will report <3  assess medication schedule as needed   Skin   L AKA w/ staples approximated, healing appropriately  no additional skin breakdown  monitor q shift for s/s of infection or skin breakdown      *See Interdisciplinary Assessment and Plan and progress notes for long and short-term goals  Barriers to Discharge: anxiety of wife    Possible Resolutions to Barriers:  family ed    Discharge Planning/Teaching Needs:  Family education tomorrow- DME to be delivered today to home. Wife feeling better now      Team Discussion: Wife to ED Monday "probable stress"--resting today--to come in for family ed Wednesday w/ d/c postponed to Thursday.   Revisions to Treatment Plan: none    Continued Need for Acute Rehabilitation Level of Care: The patient requires daily  medical management by a physician with specialized training in physical medicine and rehabilitation for the following conditions: Daily direction of a multidisciplinary physical rehabilitation program to ensure safe treatment while eliciting the highest outcome that is of practical value to the patient.: Yes Daily medical management of patient stability for increased activity during participation in an intensive rehabilitation regime.: Yes Daily analysis of laboratory  values and/or radiology reports with any subsequent need for medication adjustment of medical intervention for : Post surgical problems;Other  [name] 06/29/2011, 3:48 PM

## 2011-06-29 NOTE — Progress Notes (Signed)
Physical Therapy Session Note  Patient Details  Name: Aaron Long MRN: [id number] Date of Birth: 1940-08-07  Today's Date: 06/29/2011 Time: 1445-1530 Time Calculation (min): 45 min  Short Term Goals: Week 1:  PT Short Term Goal 1 (Week 1): =LTG PT Short Term Goal 1 - Progress (Week 1): Progressing toward goal  Skilled Therapeutic Interventions/Progress Updates:    Pt's new w/c and RW for home use delivered to room.  Oriented pt to new equipment and adjusted to pt as needed.  Pt cont to need cues for w/c management and safety with transfers.  Therapist placed velcro on leg rest lever to assist with locating and managing leg rest removal.  Pt continues to need assist with donning L amputee pad on w/c.  Pt self-propelled w/c to gym for improved endurance, w/c mobility, and UE strengthening.  Tx focused on dynamic standing balance with RUE support only in RW and sitting dynamic balance while playing Wii tennis.  Pt was able to stand x 2' before needing rest break due to RLE fatigue.  Pt began to present with bilateral UE shaking and c/o of "feeling jittery".  In concern for low blood sugar, pt given apple juice and therapist notified RN.  Blood sugar=77 after pt drank 1 cup of apple juice.  Pt given graham crackers as well and remained sitting up in w/c with call bell in reach.  Therapy Documentation Precautions:  Precautions Precautions: Fall Precaution Comments: L BKA Required Braces or Orthoses: Other Brace/Splint Other Brace/Splint: left limb guard, left residual limb wrapping Restrictions Weight Bearing Restrictions: No  Pain: No c/o pain  See FIM for current functional status  Therapy/Group: Individual Therapy  Deirdre Pippins 06/29/2011, 3:35 PM

## 2011-06-29 NOTE — Progress Notes (Signed)
Physical Therapy Session Note  Patient Details  Name: Aaron Long MRN: [id number] Date of Birth: 05/26/1940  Today's Date: 06/29/2011 Time: 10:15-11:15 (60 min)  Short Term Goals: Week 1:  PT Short Term Goal 1 (Week 1): =LTG PT Short Term Goal 1 - Progress (Week 1): Progressing toward goal  Skilled Therapeutic Interventions/Progress Updates:    Pt reports that wife is home from the ER and that he feels as though he has less strength than usual today. During session, case manager discussed with patient that wife will be coming to rehab tomorrow to participate in family ed which would push pt's d/c date to [facility] 6/20.  Pt and therapist agree with elongated LOS as family ed is not complete at this time.  Tx focused on bed-->w/c transfers, w/c mobility and management, dynamic standing balance in parallel bars with overhead reaching, and gait in home environment (on carpet).  Pt required min A for sitting EOB to w/c transfer due to fatigue, weakness, and decreased safety awareness.  Pt continues to require cueing for management of w/c parts and assist with donning L amputee pad onto chair.  Pt was able to place R leg rest on today with increased time.  Pt was able to stand in parallel bars with 1 UE support for approx 2 minutes before needing to sit due to RLE fatigue.  Educated pt on home modifications and placing frequently used items at an accessible height as well as recognizing when assist is needed at home.  Pt reports understanding.  Gait x 8' in RW on carpet prior to fatigue. Pt assisted back to room and encourage to sit up in chair to eat lunch.  Provided pt with amputee support group pamphlet and magazine while discussing the benefits of peer support.  Therapy Documentation Precautions:  Precautions Precautions: Fall Precaution Comments: L BKA Required Braces or Orthoses: Other Brace/Splint Other Brace/Splint: left limb guard, left residual limb wrapping Restrictions Weight Bearing  Restrictions: No Pain: Pt reports 10/10 pain at beginning of session.  Pt used call bell to ask for pain medication but RN reported that he had just received some and was not due for more medicine yet.  Pt removed limb guard and educated pt on appropriate times to use limb guard.  Pt reports decrease pain with limb guard removal of 8/10.  See FIM for current functional status  Therapy/Group: Individual Therapy  Deirdre Pippins 06/29/2011, 12:24 PM

## 2011-06-29 NOTE — Progress Notes (Signed)
Patient last bowel movement 6-17 per patient report, last documented  Bowel movement 06-26-11 patient was given senna with no results. Will continue to monitor for bowel movement.

## 2011-06-30 ENCOUNTER — Encounter: Payer: Self-pay | Admitting: Vascular Surgery

## 2011-06-30 DIAGNOSIS — S78119A Complete traumatic amputation at level between unspecified hip and knee, initial encounter: Secondary | ICD-10-CM

## 2011-06-30 DIAGNOSIS — I739 Peripheral vascular disease, unspecified: Secondary | ICD-10-CM

## 2011-06-30 DIAGNOSIS — Z5189 Encounter for other specified aftercare: Secondary | ICD-10-CM

## 2011-06-30 DIAGNOSIS — L98499 Non-pressure chronic ulcer of skin of other sites with unspecified severity: Secondary | ICD-10-CM

## 2011-06-30 LAB — GLUCOSE, CAPILLARY: Glucose-Capillary: 130 mg/dL — ABNORMAL HIGH (ref 70–99)

## 2011-06-30 LAB — PROTIME-INR: Prothrombin Time: 29.9 seconds — ABNORMAL HIGH (ref 11.6–15.2)

## 2011-06-30 MED ORDER — WARFARIN SODIUM 5 MG PO TABS
5.0000 mg | ORAL_TABLET | Freq: Every day | ORAL | Status: DC
  Administered 2011-06-30: 5 mg via ORAL
  Filled 2011-06-30 (×2): qty 1

## 2011-06-30 MED ORDER — INSULIN DETEMIR 100 UNIT/ML ~~LOC~~ SOLN
10.0000 [IU] | Freq: Every day | SUBCUTANEOUS | Status: DC
  Administered 2011-06-30: 10 [IU] via SUBCUTANEOUS
  Filled 2011-06-30: qty 10

## 2011-06-30 NOTE — Progress Notes (Signed)
Physical Therapy Discharge Summary  Patient Details  Name: Aaron Long MRN: [id number] Date of Birth: 04-04-1940  Today's Date: 06/30/2011 Time: 1445-1530 Time Calculation (min): 45 min  Patient has met 9 of 9 long term goals due to improved activity tolerance, improved balance, improved postural control, increased strength, decreased pain, ability to compensate for deficits and improved awareness.  Patient to discharge at a wheelchair level with Mod I propulsion and supervision for transfers.  PT ambulates short distances (~30') with RW with Min A.  Patient's wife is is independent to provide the necessary physical assistance at discharge.  Recommendation:  Patient will benefit from ongoing skilled PT services in home health setting to continue to advance safe functional mobility, address ongoing impairments in activity tolerance, dynamic standing balance, LE strengthening, gait, pre-prosthesis training, endurance, and minimize fall risk.  Equipment: standard wheelchair with basic back, basic cushion, and L amputee pad; RW  Reasons for discharge: treatment goals met and discharge from hospital  Patient/family agrees with progress made and goals achieved: Yes  PT Discharge Precautions/Restrictions Precautions Precautions: Fall Precaution Comments: L BKA Other Brace/Splint:  (L limb gaurd for comfort) Restrictions Weight Bearing Restrictions: No Vision/Perception  Vision - History Baseline Vision: Wears glasses all the time Perception Perception: Within Functional Limits Praxis Praxis: Intact  Cognition Overall Cognitive Status: Appears within functional limits for tasks assessed Arousal/Alertness: Awake/alert Orientation Level: Oriented X4 Problem Solving: Appears intact Safety/Judgment: Appears intact (requires some cueing for stand>sit transfer & w/c management) Sensation Sensation Light Touch: Appears Intact Proprioception: Appears Intact Additional Comments: UEs  appear intact Coordination Gross Motor Movements are Fluid and Coordinated: Yes Fine Motor Movements are Fluid and Coordinated: Yes Motor  Motor Motor: Within Functional Limits (L BKA)  Locomotion  Ambulation Ambulation/Gait Assistance: 4: Min assist Gait Gait: Yes Gait Pattern: Impaired Gait Pattern: Step-to pattern;Decreased step length - right;Decreased hip/knee flexion - right Gait velocity: decreased Stairs / Additional Locomotion Stairs: No Ramp: 4: Min assist Curb: 1: +1 Total assist ("bump up" 1 curb step in w/c) Naval architect Mobility: Yes Wheelchair Assistance: 6: Modified independent (Device/Increase time) Occupational hygienist: Both upper extremities Wheelchair Parts Management: Supervision/cueing Distance: >200'  Trunk/Postural Assessment  Cervical Assessment Cervical Assessment: Within Functional Limits Thoracic Assessment Thoracic Assessment: Within Functional Limits Lumbar Assessment Lumbar Assessment: Within Functional Limits Postural Control Postural Control: Within Functional Limits  Balance Balance Balance Assessed: Yes Static Sitting Balance Static Sitting - Level of Assistance: 6: Modified independent (Device/Increase time) Dynamic Sitting Balance Dynamic Sitting - Level of Assistance: 6: Modified independent (Device/Increase time) Static Standing Balance Static Standing - Balance Support: Bilateral upper extremity supported Static Standing - Level of Assistance: 5: Stand by assistance Dynamic Standing Balance Dynamic Standing - Level of Assistance: 5: Stand by assistance Extremity Assessment  RUE Assessment RUE Assessment: Within Functional Limits LUE Assessment LUE Assessment: Within Functional Limits RLE Assessment RLE Assessment: Within Functional Limits RLE Strength RLE Overall Strength Comments: 5/5 strength throughout LLE Assessment LLE Assessment: Exceptions to [facility] (4/5 hip flex, 4-/5 knee ext and knee flex)  See  FIM for current functional status  Skilled Therapy Session:  Individual therapy session.  No c/o pain.  Wife present for family education session this pm.  Tx focused on gait x 30' in home environment (on carpet) while navigating around obstacles.  Wife demonstrated ability to provide the min assist that the pt requires.  Cued pt for increased hip and knee flexion to decrease risk of falling and proper assist for stand-->sit transfers.  Further educated pt and wife on proper and safe sit<>stand transfers, signs of fatigue, energy conservation, planning ahead with tasks, and increased energy expenditure. Pt and wife able to manage w/c and RW parts and mobility appropriately.  All questions answered prior to therapist leaving pt in room with wife.     Deirdre Pippins 06/30/2011, 4:21 PM

## 2011-06-30 NOTE — Progress Notes (Signed)
Occupational Therapy Session Note  Patient Details  Name: KYHEEM BATHGATE MRN: [id number] Date of Birth: April 16, 1940  Today's Date: 06/30/2011 Time: 0900-1000 Time Calculation (min): 60 min  Short Term Goals: Week 1:  OT Short Term Goal 1 (Week 1): Short Term Goals = Long Term Goals  Skilled Therapeutic Interventions:  Self care retraining to include sponge bath, dressing and grooming at sink in w/c.  Focus session on bed mobility, bed>w/c transfer, sit><stand, dynamic sitting and standing balance, guiding care partner PRN to include wrapping of residual limb, and meeting overall Supervision LTGs set by OT.  Patient's wife often required VCs to allow patient to perform tasks before she offers assistance.    Precautions:  Precautions Precautions: Fall Precaution Comments: L BKA Required Braces or Orthoses: Other Brace/Splint Other Brace/Splint: left limb guard, left residual limb wrapping Restrictions Weight Bearing Restrictions: No  Pain: 5/10 left residual limb, RN aware and medication provided.  See FIM for current functional status  Therapy/Group: Individual Therapy and care partner education  Clevland Cork 06/30/2011, 10:52 AM

## 2011-06-30 NOTE — Progress Notes (Signed)
ANTICOAGULATION CONSULT NOTE - Follow Up Consult  Pharmacy Consult for Coumadin Indication: DVT  No Known Allergies  Patient Measurements: Weight: 214 lb 1.1 oz (97.1 kg) Heparin Dosing Weight: 92Kg (IBW=71Kg)  Vital Signs: Temp: 98.8 F (37.1 C) (06/19 0525) Temp src: Oral (06/19 0525) BP: 152/75 mmHg (06/19 0525) Pulse Rate: 79  (06/19 0525)  Labs:  Basename 06/30/11 0545 06/29/11 0500 06/28/11 0620  HGB -- -- 9.2*  HCT -- -- 29.3*  PLT -- -- 520*  APTT -- -- --  LABPROT 29.9* 24.9* 24.5*  INR 2.79* 2.21* 2.16*  HEPARINUNFRC -- -- 0.58  CREATININE -- -- --  CKTOTAL -- -- --  CKMB -- -- --  TROPONINI -- -- --   Assessment: Admitted 06/10/2011 with dry gangrene of left fifth toe. Underwent amputation of left fifth toe + debridement 06/14/2011. Underwent L BKA 06/16/2011 d/t progressive ischemic changes. Venous Doppler study on 06/19/2011 showed DVT left popliteal vein.  Anticoagulation: Heparin d/c'd 6/17 after 6 days overlap. INR 2.79 in goal range but rising. Hgb improved to 9.2. No bleeding noted. Coumadin teaching done 6/17. Patient may require some reinforcement of information as he was concerned about his wife that had to go to ER that day after helping him in the gym.   Goal of Therapy:  INR 2-3 Monitor platelets by anticoagulation protocol: Yes   Plan:  Try Coumadin 5mg  daily for maintenance.   Vaani Morren S. Merilynn Finland, PharmD, [facility] Clinical Staff Pharmacist Pager [phone]  06/30/2011 7:39 AM

## 2011-06-30 NOTE — Discharge Summary (Signed)
NAMEKEYTON, BHAT NO.:  [id number]  MEDICAL RECORD NO.:  [id number]  LOCATION:  4005                         FACILITY:  [facility]  PHYSICIAN:  [name], M.D.DATE OF BIRTH:  1941-01-06  DATE OF ADMISSION:  06/21/2011 DATE OF DISCHARGE:                              DISCHARGE SUMMARY   ADDENDUM:  The patient initially scheduled for discharge on June 30, 2011, due to some medical complications with his wife.  Discharge was extended to 06/20 to enable full family teaching to be completed.  The patient remained medically stable.  Plan remains to follow up with Dr. Faith Rogue outpatient as advised as well as Dr. Darrick Penna of Vascular Surgery after recent left below-knee amputation.  He would remain on chronic Coumadin therapy with latest INR of 2.79 to be followed by Dr. Marquis Buggy. Family was considering moving primary care providers closer to [facility] which could be explained with Dr. Marquis Buggy as well as contact made to Dr. Glade Lloyd of [facility].  Please see body of discharge summary for full details in regards to hospital course and discharge medications.     Mariam Dollar, P.A.   ______________________________ [name], M.D.    DA/MEDQ  D:  06/30/2011  T:  06/30/2011  Job:  [id number]

## 2011-06-30 NOTE — Progress Notes (Signed)
Reviewed and in agreement with treatment provided.  

## 2011-06-30 NOTE — Progress Notes (Signed)
Patient ID: Aaron Long, male   DOB: 1940/07/31, 71 y.o.   MRN: [id number] Patient ID: Aaron Long, male   DOB: Oct 02, 1940, 71 y.o.   MRN: [id number] Subjective/Complaints:  Pain under control. Slept well. Sugar 70 this am. Says his wife is feeling better No sob, no dysuria  Review of Systems  Genitourinary: Positive for urgency.  Musculoskeletal: Positive for joint pain.  Neurological: Positive for sensory change.  All other systems reviewed and are negative.    Objective: Vital Signs: Blood pressure 152/75, pulse 79, temperature 98.8 F (37.1 C), temperature source Oral, resp. rate 20, weight 97.1 kg (214 lb 1.1 oz), SpO2 95.00%. No results found. Results for orders placed during the hospital encounter of 06/21/11 (from the past 72 hour(s))  GLUCOSE, CAPILLARY     Status: Abnormal   Collection Time   06/27/11 11:21 AM      Component Value Range Comment   Glucose-Capillary 167 (*) 70 - 99 mg/dL    Comment 1 Notify RN     GLUCOSE, CAPILLARY     Status: Normal   Collection Time   06/27/11  4:15 PM      Component Value Range Comment   Glucose-Capillary 87  70 - 99 mg/dL    Comment 1 Notify RN     GLUCOSE, CAPILLARY     Status: Abnormal   Collection Time   06/27/11  9:14 PM      Component Value Range Comment   Glucose-Capillary 148 (*) 70 - 99 mg/dL    Comment 1 Notify RN     PROTIME-INR     Status: Abnormal   Collection Time   06/28/11  6:20 AM      Component Value Range Comment   Prothrombin Time 24.5 (*) 11.6 - 15.2 seconds    INR 2.16 (*) 0.00 - 1.49   CBC     Status: Abnormal   Collection Time   06/28/11  6:20 AM      Component Value Range Comment   WBC 9.3  4.0 - 10.5 K/uL    RBC 3.19 (*) 4.22 - 5.81 MIL/uL    Hemoglobin 9.2 (*) 13.0 - 17.0 g/dL    HCT 78.2 (*) 95.6 - 52.0 %    MCV 91.8  78.0 - 100.0 fL    MCH 28.8  26.0 - 34.0 pg    MCHC 31.4  30.0 - 36.0 g/dL    RDW 21.3  08.6 - 57.8 %    Platelets 520 (*) 150 - 400 K/uL   HEPARIN LEVEL  (UNFRACTIONATED)     Status: Normal   Collection Time   06/28/11  6:20 AM      Component Value Range Comment   Heparin Unfractionated 0.58  0.30 - 0.70 IU/mL   GLUCOSE, CAPILLARY     Status: Abnormal   Collection Time   06/28/11  7:11 AM      Component Value Range Comment   Glucose-Capillary 110 (*) 70 - 99 mg/dL    Comment 1 Notify RN     GLUCOSE, CAPILLARY     Status: Abnormal   Collection Time   06/28/11 11:17 AM      Component Value Range Comment   Glucose-Capillary 189 (*) 70 - 99 mg/dL    Comment 1 Notify RN     GLUCOSE, CAPILLARY     Status: Normal   Collection Time   06/28/11  4:29 PM      Component Value Range Comment   Glucose-Capillary  80  70 - 99 mg/dL    Comment 1 Notify RN      Comment 2 Documented in Chart     GLUCOSE, CAPILLARY     Status: Abnormal   Collection Time   06/28/11  9:32 PM      Component Value Range Comment   Glucose-Capillary 137 (*) 70 - 99 mg/dL   PROTIME-INR     Status: Abnormal   Collection Time   06/29/11  5:00 AM      Component Value Range Comment   Prothrombin Time 24.9 (*) 11.6 - 15.2 seconds    INR 2.21 (*) 0.00 - 1.49   GLUCOSE, CAPILLARY     Status: Abnormal   Collection Time   06/29/11  7:15 AM      Component Value Range Comment   Glucose-Capillary 120 (*) 70 - 99 mg/dL    Comment 1 Notify RN     GLUCOSE, CAPILLARY     Status: Abnormal   Collection Time   06/29/11 11:33 AM      Component Value Range Comment   Glucose-Capillary 174 (*) 70 - 99 mg/dL    Comment 1 Notify RN     GLUCOSE, CAPILLARY     Status: Normal   Collection Time   06/29/11  3:27 PM      Component Value Range Comment   Glucose-Capillary 77  70 - 99 mg/dL   GLUCOSE, CAPILLARY     Status: Normal   Collection Time   06/29/11  4:28 PM      Component Value Range Comment   Glucose-Capillary 84  70 - 99 mg/dL    Comment 1 Notify RN     GLUCOSE, CAPILLARY     Status: Abnormal   Collection Time   06/29/11  8:49 PM      Component Value Range Comment    Glucose-Capillary 122 (*) 70 - 99 mg/dL    Comment 1 Notify RN     PROTIME-INR     Status: Abnormal   Collection Time   06/30/11  5:45 AM      Component Value Range Comment   Prothrombin Time 29.9 (*) 11.6 - 15.2 seconds    INR 2.79 (*) 0.00 - 1.49   GLUCOSE, CAPILLARY     Status: Normal   Collection Time   06/30/11  7:28 AM      Component Value Range Comment   Glucose-Capillary 78  70 - 99 mg/dL    Comment 1 Notify RN        HEENT: normal Cardio: RRR Resp: CTA B/L GI: BS positive Extremity:  Edema L stumpt distal Skin:   Other L stump staples intact Neuro: Alert/Oriented and Abnormal Sensory reduced lt touch R foot Musc/Skel:  Other L BKA skin intact, no change in redness around wound. Large dog ears on stump. Wound looks great-well approximated, no drainage. Leg appropriately tender.  short term memory deficits  Assessment/Plan: 1. Functional deficits secondary to L BKA due to PVD which require 3+ hours per day of interdisciplinary therapy in a comprehensive inpatient rehab setting. Physiatrist is providing close team supervision and 24 hour management of active medical problems listed below. Physiatrist and rehab team continue to assess barriers to discharge/monitor patient progress toward functional and medical goals.  I  FIM: FIM - Bathing Bathing Steps Patient Completed: Chest;Right Arm;Left Arm;Abdomen;Front perineal area;Buttocks;Right upper leg;Left upper leg;Right lower leg (including foot) Bathing: 5: Supervision: Safety issues/verbal cues  FIM - Upper Body Dressing/Undressing Upper body dressing/undressing  steps patient completed: Thread/unthread right sleeve of pullover shirt/dresss;Thread/unthread left sleeve of pullover shirt/dress;Put head through opening of pull over shirt/dress;Pull shirt over trunk Upper body dressing/undressing: 5: Supervision: Safety issues/verbal cues FIM - Lower Body Dressing/Undressing Lower body dressing/undressing steps patient  completed: Thread/unthread right underwear leg;Thread/unthread left underwear leg;Pull underwear up/down;Thread/unthread right pants leg;Thread/unthread left pants leg;Pull pants up/down;Don/Doff right sock;Don/Doff right shoe;Fasten/unfasten right shoe Lower body dressing/undressing: 4: Steadying Assist  FIM - Toileting Toileting steps completed by patient: Adjust clothing prior to toileting;Performs perineal hygiene;Adjust clothing after toileting Toileting Assistive Devices: Grab bar or rail for support Toileting: 5: Supervision: Safety issues/verbal cues  FIM - Diplomatic Services operational officer Devices: Bedside commode Toilet Transfers: 5-To toilet/BSC: Supervision (verbal cues/safety issues);5-From toilet/BSC: Supervision (verbal cues/safety issues)  FIM - Bed/Chair Transfer Bed/Chair Transfer Assistive Devices: Bed rails;Arm rests;HOB elevated Bed/Chair Transfer: 5: Supine > Sit: Supervision (verbal cues/safety issues);4: Bed > Chair or W/C: Min A (steadying Pt. > 75%)  FIM - Locomotion: Wheelchair Distance: 20'  Locomotion: Wheelchair: 2: Travels 50 - 149 ft with supervision, cueing or coaxing FIM - Locomotion: Ambulation Locomotion: Ambulation Assistive Devices: Designer, industrial/product Ambulation/Gait Assistance: 4: Min assist Locomotion: Ambulation: 1: Travels less than 50 ft with minimal assistance (Pt.>75%) (8' on carpet)  Comprehension Comprehension Mode: Auditory Comprehension: 7-Follows complex conversation/direction: With no assist  Expression Expression Mode: Verbal Expression: 7-Expresses complex ideas: With no assist  Social Interaction Social Interaction: 7-Interacts appropriately with others - No medications needed.  Problem Solving Problem Solving: 7-Solves complex problems: Recognizes & self-corrects  Memory Memory: 7-Complete Independence: No helper  Medical Problem List and Plan:  1. left BKA secondary to PVD 06/16/2011  2. DVT  Prophylaxis/Anticoagulation: DVT left popliteal vein. Coumadin therapeutic now. 3. Pain Management: oxycodone as needed, Lyrica 100mg  bid-hold on further titration at present- try to avoid any neurosedating effects. 4. Wound culture positive MRSA. Contact precautions completed Zosyn.;  afebrile and wound without drainage.  cipro on board- afebrile- dc and observe clinically. Wound looks excellent. Would hold off on shrinker until "dog ears" and swelling is decreased. 5. Diabetes mellitus with neuropathy controlled. Hemoglobin A1c 7.9. Levemir 26 units twice a day. Check CBGs a.c. and at bedtime. Will further decrease pm levemir. 6. Hypertension uncontrolled. Norvasc 10 mg daily. Monitor with increased mobility  7. Hyperlipidemia. Zocor    LOS (Days) 9 A FACE TO FACE EVALUATION WAS PERFORMED  Leshawn Houseworth T 06/30/2011, 8:27 AM

## 2011-06-30 NOTE — Care Management Note (Signed)
Per State Regulation 482.30 This chart was reviewed for medical necessity with respect to the patient's Admission/Duration of stay. Because wife needed a rest break yesterday d/t "stress" which took her to the ED on Monday, family education was postponed to today w/ d/c postponed to Thursday [tomorrow].  Wife is in today for education. Pt participating txs & reaching goals.  [name]                 Nurse Care Manager              Next Review Date: none

## 2011-06-30 NOTE — Progress Notes (Signed)
Physical Therapy Session Note  Patient Details  Name: Aaron Long MRN: [id number] Date of Birth: 01-Nov-1940  Today's Date: 06/30/2011 Time: 1000-1055 Time Calculation (min): 55 min  Short Term Goals: Week 1:  PT Short Term Goal 1 (Week 1): =LTG PT Short Term Goal 1 - Progress (Week 1): Progressing toward goal  Skilled Therapeutic Interventions/Progress Updates:    Wife present for family ed session.  Pt and wife demonstrated safe competency with bumping w/c up curb step in order for pt to access home (3 platform steps).  Wife had difficulty with tilting back w/c and educated pt and wife of how another person can assist to lift front w/c wheels up onto curb.  Educated pt with ascending/descending ramp to improve community independence.  Pt struggled with ascending ramp due to decreased UE strength, educated wife on when it is proper to assist husband.  Pt and wife performed car transfer using RW with safe and proper technique and wife educated on assisting pt with short distance gait in RW.  Further educated wife with pt's signs of decreased blood sugar, home modifications, further follow up with PT and community integration.  Wife and pt report understanding.  Wife verbally reports that she now feels prepared to go home and that "we can handle everything".  Therapy Documentation Precautions:  Precautions Precautions: Fall Precaution Comments: L BKA Required Braces or Orthoses: Other Brace/Splint Other Brace/Splint: left limb guard, left residual limb wrapping Restrictions Weight Bearing Restrictions: No Pain: No c/o pain during therapy session- pt premedicated.  See FIM for current functional status  Therapy/Group: Individual Therapy  Deirdre Pippins 06/30/2011, 10:56 AM

## 2011-07-01 ENCOUNTER — Ambulatory Visit: Payer: Medicare Other | Admitting: Vascular Surgery

## 2011-07-01 DIAGNOSIS — Z5189 Encounter for other specified aftercare: Secondary | ICD-10-CM

## 2011-07-01 DIAGNOSIS — S78119A Complete traumatic amputation at level between unspecified hip and knee, initial encounter: Secondary | ICD-10-CM

## 2011-07-01 DIAGNOSIS — L98499 Non-pressure chronic ulcer of skin of other sites with unspecified severity: Secondary | ICD-10-CM

## 2011-07-01 DIAGNOSIS — I739 Peripheral vascular disease, unspecified: Secondary | ICD-10-CM

## 2011-07-01 LAB — PROTIME-INR
INR: 2.59 — ABNORMAL HIGH (ref 0.00–1.49)
Prothrombin Time: 28.2 seconds — ABNORMAL HIGH (ref 11.6–15.2)

## 2011-07-01 MED ORDER — INSULIN DETEMIR 100 UNIT/ML ~~LOC~~ SOLN: 10.0000 [IU] | Freq: Every day | SUBCUTANEOUS | Status: DC

## 2011-07-01 MED ORDER — OXYCODONE HCL 5 MG PO TABS: 5.0000 mg | ORAL_TABLET | ORAL | Status: AC | PRN

## 2011-07-01 MED ORDER — WARFARIN SODIUM 5 MG PO TABS: 5.0000 mg | ORAL_TABLET | Freq: Every day | ORAL | Status: DC

## 2011-07-01 MED ORDER — INSULIN DETEMIR 100 UNIT/ML ~~LOC~~ SOLN
10.0000 [IU] | Freq: Every day | SUBCUTANEOUS | Status: DC
  Administered 2011-07-01: 10 [IU] via SUBCUTANEOUS
  Filled 2011-07-01: qty 10

## 2011-07-01 MED ORDER — DOXAZOSIN MESYLATE 1 MG PO TABS: 1.0000 mg | ORAL_TABLET | Freq: Every day | ORAL | Status: DC

## 2011-07-01 MED ORDER — PREGABALIN 100 MG PO CAPS: 100.0000 mg | ORAL_CAPSULE | Freq: Two times a day (BID) | ORAL | Status: DC

## 2011-07-01 NOTE — Progress Notes (Signed)
Occupational Therapy Discharge Summary  Patient Details  Name: Aaron Long MRN: [id number] Date of Birth: Mar 01, 1940  Today's Date: 07/01/2011  Patient has met 10 of 10 long term goals due to improved activity tolerance, improved balance, ability to compensate for deficits, improved awareness and improved coordination.  Patient to discharge at overall Supervision level.  Patient's care partner is independent to provide the necessary supervision needed  assistance at discharge. Education has been completed with patient and with patient's wife regarding all basic self care tasks, educational diabetes handouts have been given to patient & wife, inspection mirror was administered to patient & education regarding foot care has been provided & demonstrated, and education regarding compression wrapping has been demonstrated & wife has return demonstrated technique (handout also given regarding compression wrapping).   Reasons goals not met: N/A all goals met at this time.  Recommendation:  Patient will benefit from ongoing skilled OT services in home health setting to continue to advance functional skills in the area of BADL, iADL and Reduce care partner burden.  Equipment: Drop Arm BSC  Reasons for discharge: treatment goals met and discharge from hospital  Patient/family agrees with progress made and goals achieved: Yes  Precautions/Restrictions  Precautions Precautions: Fall Precaution Comments: left BKA Required Braces or Orthoses: Other Brace/Splint Other Brace/Splint: left limb guard Restrictions Weight Bearing Restrictions: No  ADL - See FIM  Vision/Perception  Vision - History Baseline Vision: Wears glasses all the time Patient Visual Report: No change from baseline Vision - Assessment Eye Alignment: Within Functional Limits Perception Perception: Within Functional Limits Praxis Praxis: Intact   Cognition Overall Cognitive Status: Appears within functional limits for  tasks assessed Arousal/Alertness: Awake/alert Orientation Level: Oriented X4  Sensation Sensation Additional Comments: UEs appear intact Coordination Gross Motor Movements are Fluid and Coordinated: Yes Fine Motor Movements are Fluid and Coordinated: Yes  Motor - See Discharge Navigator  Mobility - See Discharge Navigator  Trunk/Postural Assessment - See Discharge Navigator  Balance- See Discharge Navigator  Extremity/Trunk Assessment RUE Assessment RUE Assessment: Within Functional Limits LUE Assessment LUE Assessment: Within Functional Limits  See FIM for current functional status  [name] 07/01/2011, 8:03 AM

## 2011-07-01 NOTE — Progress Notes (Signed)
ANTICOAGULATION CONSULT NOTE - Follow Up Consult  Pharmacy Consult for Coumadin Indication: DVT  No Known Allergies  Patient Measurements: Weight: 209 lb 3.5 oz (94.9 kg) Heparin Dosing Weight: 92Kg (IBW=71Kg)  Vital Signs: Temp: 98.5 F (36.9 C) (06/20 0637) Temp src: Oral (06/20 0637) BP: 131/56 mmHg (06/20 0637) Pulse Rate: 80  (06/20 0637)  Labs:  Basename 07/01/11 0700 06/30/11 0545 06/29/11 0500  HGB -- -- --  HCT -- -- --  PLT -- -- --  APTT -- -- --  LABPROT 28.2* 29.9* 24.9*  INR 2.59* 2.79* 2.21*  HEPARINUNFRC -- -- --  CREATININE -- -- --  CKTOTAL -- -- --  CKMB -- -- --  TROPONINI -- -- --   Assessment: Admitted 06/10/2011 with dry gangrene of left fifth toe. Underwent amputation of left fifth toe + debridement 06/14/2011. Underwent L BKA 06/16/2011 d/t progressive ischemic changes. Venous Doppler study on 06/19/2011 showed DVT left popliteal vein.  Anticoagulation: Heparin d/c'd 6/17 after 6 days overlap. INR 2.59 in goal range. Hgb improved to 9.2. No bleeding noted. Coumadin teaching done 6/17.  Cardiovascular: Hx HTN, CAD, HLD .  Meds: on home amlodipine 10mg , zocor (pravachol PTA), ASA 81mg , Cardura  Endocrinology: Hx DM on home Levemir (26 units BID PTA)  23 units bid, CBG 69-137  Gastrointestinal / Nutrition: LFTs WNL, CHO mod diet, po PPI  Neurology: Hx of diabetic neuropathy- on home pregbalin  Nephrology: SCr trending down, still up at 2.53; admit Scr ~3. Lytes are WNL  Hematology / Oncology: Hgb improved to 9.2 CBC stable  Goal of Therapy:  INR 2-3 Monitor platelets by anticoagulation protocol: Yes  Plan:  Try Coumadin 5mg  daily for maintenance.    Mckade Gurka S. Merilynn Finland, PharmD, [facility] Clinical Staff Pharmacist Pager [phone]  07/01/2011 9:51 AM

## 2011-07-01 NOTE — Progress Notes (Signed)
Occupational Therapy Session Note  Patient Details  Name: Aaron Long MRN: [id number] Date of Birth: 1940/02/26  Today's Date: 06/30/2011 Time: 14:10-14:40, 30 Min   Short Term Goals: Week 1:  OT Short Term Goal 1 (Week 1): Short Term Goals = Long Term Goals  Skilled Therapeutic Interventions:  Patient sleeping in bed upon arrival and daughter seated in corner of room.  Patient was unsafe with transfer bed>w/c requiring mod vcs for safety; patient stated that he was still sleepy.  Reviewed toileting and toilet transfers within context of his home as compared to current hospital room.  Patient realized that he will need to ambulate into bathroom or use a bedside commode (drop arm commode has been ordered by the Child psychotherapist).  Patient practiced ambulate >< toilet and toileting with daughter choosing to only observe.  Patient states that daughter tries to "parent" him which he does not like.  Patient often stated "we will be fine when we get home", or "we will figure it out" each time this clinician provided recommendations or cues.  Focus of session was family education, sit><stand, dynamic standing balance, activity tolerance, and prepare for discharge home with 24/7 supervision or assist on 07/01/11.  Patient and daughter both verbalized pleased with progress and current level of function and stated not further questions or concerns.  Therapy Documentation Precautions:  Precautions Precautions: Fall Precaution Comments: L BKA Required Braces or Orthoses: Other Brace/Splint Other Brace/Splint:  (L limb gaurd for comfort) Restrictions Weight Bearing Restrictions: No  Pain: No c/o pain  Therapy/Group: Individual Therapy and education with patient's daughter  Pleas Patricia 06/30/2011, 15:05 PM

## 2011-07-01 NOTE — Discharge Instructions (Signed)
Inpatient Rehab Discharge Instructions  Aaron Long Discharge date and time: No discharge date for patient encounter.   Activities/Precautions/ Functional Status: Activity: activity as tolerated Diet: diabetic diet Wound Care: keep wound clean and dry Functional status:  ___ No restrictions     ___ Walk up steps independently __x_ 24/7 supervision/assistance   ___ Walk up steps with assistance ___ Intermittent supervision/assistance  ___ Bathe/dress independently ___ Walk with walker     ___ Bathe/dress with assistance ___ Walk Independently    ___ Shower independently ___ Walk with assistance    ___ Shower with assistance ___ No alcohol     ___ Return to work/school ________  Special Instructions:  Home health nurse to check INR on Monday, June 24 results to [facility] [phone] fax number [phone]    COMMUNITY REFERRALS UPON DISCHARGE:    Home Health:   PT,OT,RN     Agency:[facility] Phone: [phone] Date of last service:07/01/2011      Medical Equipment/Items Ordered: HOSPITAL BED, ROLLING WALKER, DROP-ARM BSC, WHEELCHAIR  Agency/Supplier:[facility] Other:PCP SET UP VIA [facility]  GENERAL COMMUNITY RESOURCES FOR PATIENT/FAMILY: Support Groups:[employer]PPORT [employer]  My questions have been answered and I understand these instructions. I will adhere to these goals and the provided educational materials after my discharge from the hospital.  Patient/Caregiver Signature _______________________________ Date __________  Clinician Signature _______________________________________ Date __________  Please bring this form and your medication list with you to all your follow-up doctor's appointments.

## 2011-07-01 NOTE — Progress Notes (Signed)
Social Work Discharge Note Discharge Note  The overall goal for the admission was met for:   Discharge location: Yes-HOME WITH WIFE WHO CAN ASSIST  Length of Stay: Yes-9 DAYS  Discharge activity level: Yes-SUPERVISION/MIN LEVEL  Home/community participation: Yes  Services provided included: MD, RD, PT, OT, RN, CM, TR, Pharmacy and SW  Financial Services: Medicare and Private Insurance: BCBS-SECONDARY  Follow-up services arranged: Home Health: [facility]-PT,OT,RN, DME: [facility]-HSOPTIAL BED, ROLLING WLAKER, DROP-ARM BSC, WHEELCHAIR and Patient/Family has no preference for HH/DME agencies  Comments (or additional information):FAMILY EDUCATION COMPLETED AND BOTH COMFORTABLE WITH DISCHARGE TODAY  Patient/Family verbalized understanding of follow-up arrangements: Yes  Individual responsible for coordination of the follow-up plan: SANDRA-WIFE  Confirmed correct DME delivered: Lucy Chris 07/01/2011    Lucy Chris

## 2011-07-01 NOTE — Progress Notes (Signed)
Subjective/Complaints: Ready to go!  Education completed. Wife feeling better about things No sob, no dysuria  Review of Systems  Genitourinary: Positive for urgency.  Musculoskeletal: Positive for joint pain.  Neurological: Positive for sensory change.  All other systems reviewed and are negative.    Objective: Vital Signs: Blood pressure 131/56, pulse 80, temperature 98.5 F (36.9 C), temperature source Oral, resp. rate 18, weight 94.9 kg (209 lb 3.5 oz), SpO2 94.00%. No results found. Results for orders placed during the hospital encounter of 06/21/11 (from the past 72 hour(s))  GLUCOSE, CAPILLARY     Status: Abnormal   Collection Time   06/28/11  7:11 AM      Component Value Range Comment   Glucose-Capillary 110 (*) 70 - 99 mg/dL    Comment 1 Notify RN     GLUCOSE, CAPILLARY     Status: Abnormal   Collection Time   06/28/11 11:17 AM      Component Value Range Comment   Glucose-Capillary 189 (*) 70 - 99 mg/dL    Comment 1 Notify RN     GLUCOSE, CAPILLARY     Status: Normal   Collection Time   06/28/11  4:29 PM      Component Value Range Comment   Glucose-Capillary 80  70 - 99 mg/dL    Comment 1 Notify RN      Comment 2 Documented in Chart     GLUCOSE, CAPILLARY     Status: Abnormal   Collection Time   06/28/11  9:32 PM      Component Value Range Comment   Glucose-Capillary 137 (*) 70 - 99 mg/dL   PROTIME-INR     Status: Abnormal   Collection Time   06/29/11  5:00 AM      Component Value Range Comment   Prothrombin Time 24.9 (*) 11.6 - 15.2 seconds    INR 2.21 (*) 0.00 - 1.49   GLUCOSE, CAPILLARY     Status: Abnormal   Collection Time   06/29/11  7:15 AM      Component Value Range Comment   Glucose-Capillary 120 (*) 70 - 99 mg/dL    Comment 1 Notify RN     GLUCOSE, CAPILLARY     Status: Abnormal   Collection Time   06/29/11 11:33 AM      Component Value Range Comment   Glucose-Capillary 174 (*) 70 - 99 mg/dL    Comment 1 Notify RN     GLUCOSE, CAPILLARY      Status: Normal   Collection Time   06/29/11  3:27 PM      Component Value Range Comment   Glucose-Capillary 77  70 - 99 mg/dL   GLUCOSE, CAPILLARY     Status: Normal   Collection Time   06/29/11  4:28 PM      Component Value Range Comment   Glucose-Capillary 84  70 - 99 mg/dL    Comment 1 Notify RN     GLUCOSE, CAPILLARY     Status: Abnormal   Collection Time   06/29/11  8:49 PM      Component Value Range Comment   Glucose-Capillary 122 (*) 70 - 99 mg/dL    Comment 1 Notify RN     PROTIME-INR     Status: Abnormal   Collection Time   06/30/11  5:45 AM      Component Value Range Comment   Prothrombin Time 29.9 (*) 11.6 - 15.2 seconds    INR 2.79 (*) 0.00 -  1.49   GLUCOSE, CAPILLARY     Status: Normal   Collection Time   06/30/11  7:28 AM      Component Value Range Comment   Glucose-Capillary 78  70 - 99 mg/dL    Comment 1 Notify RN     GLUCOSE, CAPILLARY     Status: Abnormal   Collection Time   06/30/11 11:29 AM      Component Value Range Comment   Glucose-Capillary 130 (*) 70 - 99 mg/dL    Comment 1 Notify RN     GLUCOSE, CAPILLARY     Status: Abnormal   Collection Time   06/30/11  4:39 PM      Component Value Range Comment   Glucose-Capillary 69 (*) 70 - 99 mg/dL    Comment 1 Notify RN     GLUCOSE, CAPILLARY     Status: Abnormal   Collection Time   06/30/11  8:55 PM      Component Value Range Comment   Glucose-Capillary 137 (*) 70 - 99 mg/dL    Comment 1 Notify RN        HEENT: normal Cardio: RRR Resp: CTA B/L GI: BS positive Extremity:  Edema L stumpt distal Skin:   Other L stump staples intact Neuro: Alert/Oriented and Abnormal Sensory reduced lt touch R foot Musc/Skel:  Other L BKA skin intact, no change in redness around wound. Large dog ears on stump still. Wound looks great-well approximated, no drainage. Leg appropriately tender.  short term memory deficits  Assessment/Plan: 1. Functional deficits secondary to L BKA due to PVD which require 3+ hours per day  of interdisciplinary therapy in a comprehensive inpatient rehab setting. Physiatrist is providing close team supervision and 24 hour management of active medical problems listed below. Physiatrist and rehab team continue to assess barriers to discharge/monitor patient progress toward functional and medical goals.  Ed completed. Dc home today.  FIM: FIM - Bathing Bathing Steps Patient Completed: Chest;Right Arm;Left Arm;Abdomen;Front perineal area;Buttocks;Right upper leg;Left upper leg;Right lower leg (including foot) Bathing: 5: Supervision: Safety issues/verbal cues  FIM - Upper Body Dressing/Undressing Upper body dressing/undressing steps patient completed: Thread/unthread right sleeve of pullover shirt/dresss;Thread/unthread left sleeve of pullover shirt/dress;Put head through opening of pull over shirt/dress;Pull shirt over trunk Upper body dressing/undressing: 5: Supervision: Safety issues/verbal cues FIM - Lower Body Dressing/Undressing Lower body dressing/undressing steps patient completed: Thread/unthread right underwear leg;Thread/unthread left underwear leg;Pull underwear up/down;Thread/unthread right pants leg;Thread/unthread left pants leg;Pull pants up/down;Don/Doff right sock;Don/Doff right shoe;Fasten/unfasten right shoe Lower body dressing/undressing: 5: Set-up assist to: Don/Doff AFO/prosthesis/orthosis  FIM - Toileting Toileting steps completed by patient: Adjust clothing prior to toileting;Performs perineal hygiene;Adjust clothing after toileting Toileting Assistive Devices: Grab bar or rail for support Toileting: 5: Supervision: Safety issues/verbal cues  FIM - Diplomatic Services operational officer Devices: Elevated toilet seat Toilet Transfers: 5-To toilet/BSC: Supervision (verbal cues/safety issues)  FIM - Banker Devices: Bed rails;Arm rests;HOB elevated Bed/Chair Transfer: 5: Bed > Chair or W/C: Supervision (verbal  cues/safety issues);5: Chair or W/C > Bed: Supervision (verbal cues/safety issues);6: Sit > Supine: No assist;6: Supine > Sit: No assist  FIM - Locomotion: Wheelchair Distance: >200' Locomotion: Wheelchair: 6: Travels 150 ft or more, turns around, maneuvers to table, bed or toilet, negotiates 3% grade: maneuvers on rugs and over door sills independently FIM - Locomotion: Ambulation Locomotion: Ambulation Assistive Devices: Designer, industrial/product Ambulation/Gait Assistance: 4: Min assist Locomotion: Ambulation: 1: Travels less than 50 ft with minimal assistance (Pt.>75%) (~  30')  Comprehension Comprehension Mode: Auditory Comprehension: 7-Follows complex conversation/direction: With no assist  Expression Expression Mode: Verbal Expression: 7-Expresses complex ideas: With no assist  Social Interaction Social Interaction: 7-Interacts appropriately with others - No medications needed.  Problem Solving Problem Solving: 7-Solves complex problems: Recognizes & self-corrects  Memory Memory: 7-Complete Independence: No helper  Medical Problem List and Plan:  1. left BKA secondary to PVD 06/16/2011  2. DVT Prophylaxis/Anticoagulation: DVT left popliteal vein. Coumadin therapeutic now. 3. Pain Management: oxycodone as needed, Lyrica 100mg  bid-continue for now. 4. Wound culture positive MRSA. Contact precautions completed Zosyn.;  Off cipro. Wound looks excellent. Would hold off on shrinker until "dog ears" and swelling is decreased 5. Diabetes mellitus with neuropathy controlled. Hemoglobin A1c 7.9. Levemir 26 units twice a day. Check CBGs a.c. and at bedtime. Will further decrease pm levemir to 10mg  bid. 6. Hypertension uncontrolled. Norvasc 10 mg daily. Monitor with increased mobility  7. Hyperlipidemia. Zocor    LOS (Days) 10 A FACE TO FACE EVALUATION WAS PERFORMED  Aaron Long T 07/01/2011, 7:08 AM

## 2011-07-02 DIAGNOSIS — Z48812 Encounter for surgical aftercare following surgery on the circulatory system: Secondary | ICD-10-CM | POA: Diagnosis not present

## 2011-07-02 DIAGNOSIS — I82409 Acute embolism and thrombosis of unspecified deep veins of unspecified lower extremity: Secondary | ICD-10-CM | POA: Diagnosis not present

## 2011-07-02 DIAGNOSIS — I739 Peripheral vascular disease, unspecified: Secondary | ICD-10-CM | POA: Diagnosis not present

## 2011-07-02 DIAGNOSIS — E1149 Type 2 diabetes mellitus with other diabetic neurological complication: Secondary | ICD-10-CM | POA: Diagnosis not present

## 2011-07-02 DIAGNOSIS — E1142 Type 2 diabetes mellitus with diabetic polyneuropathy: Secondary | ICD-10-CM | POA: Diagnosis not present

## 2011-07-02 DIAGNOSIS — S88119A Complete traumatic amputation at level between knee and ankle, unspecified lower leg, initial encounter: Secondary | ICD-10-CM | POA: Diagnosis not present

## 2011-07-05 ENCOUNTER — Telehealth: Payer: Self-pay | Admitting: *Deleted

## 2011-07-05 DIAGNOSIS — D689 Coagulation defect, unspecified: Secondary | ICD-10-CM | POA: Diagnosis not present

## 2011-07-05 NOTE — Telephone Encounter (Signed)
Victorino Dike went out today to check pt's PT/INR and her coag machine "errored" which means the blood is too thin. She drew a STAT PT/INR. Should he hold his coumadin for today?  I spoke to Clydie Braun, [place] about this and she states that his PCP or whoever his following his coumadin levels should be contacted. LM for Victorino Dike letting her know this.

## 2011-07-07 ENCOUNTER — Encounter: Payer: Self-pay | Admitting: Vascular Surgery

## 2011-07-07 ENCOUNTER — Telehealth: Payer: Self-pay | Admitting: Physical Medicine & Rehabilitation

## 2011-07-07 NOTE — Telephone Encounter (Signed)
Please advise 

## 2011-07-07 NOTE — Telephone Encounter (Signed)
Victorino Dike aware of Dr. Riley Kill recommendation.

## 2011-07-07 NOTE — Telephone Encounter (Signed)
Jennifer/[facility] - [phone], Patient's PCP will not follow Coumadin because there has not been a face to face and he is [place].  Has another PCP scheduled on 7/10.  Drew PT/INR today and it was 2.1.  Can Dr Riley Kill dose until PCP appt?

## 2011-07-07 NOTE — Telephone Encounter (Signed)
Continue 5mg  daily except for Saturday where he should take 7.5mg .  Check inr again on Monday, July 12, 2011

## 2011-07-08 ENCOUNTER — Encounter: Payer: Self-pay | Admitting: Vascular Surgery

## 2011-07-08 ENCOUNTER — Ambulatory Visit (INDEPENDENT_AMBULATORY_CARE_PROVIDER_SITE_OTHER): Payer: Medicare Other | Admitting: Thoracic Diseases

## 2011-07-08 VITALS — BP 136/54 | HR 82 | Temp 98.4°F | Ht 68.0 in | Wt 209.0 lb

## 2011-07-08 DIAGNOSIS — I70269 Atherosclerosis of native arteries of extremities with gangrene, unspecified extremity: Secondary | ICD-10-CM

## 2011-07-08 DIAGNOSIS — I70219 Atherosclerosis of native arteries of extremities with intermittent claudication, unspecified extremity: Secondary | ICD-10-CM | POA: Insufficient documentation

## 2011-07-08 NOTE — Progress Notes (Signed)
Patient is a 71 year old male who returns today for followup after a left below-knee amputation. He still has some pain in the amputation site but overall feels well. He said no drainage from the incision.  Physical exam: Filed Vitals:   07/08/11 1556  BP: 136/54  Pulse: 82  Temp: 98.4 F (36.9 C)  TempSrc: Oral  Height: 5\' 8"  (1.727 m)  Weight: 209 lb (94.802 kg)  SpO2: 98%   Left below-knee incision is intact. Staples are still in place. There some mild erythema over the lateral aspect. This is slightly tender to palpation. There is no obvious fluctuance.  Assessment: Healing left below-knee amputation. Staples were removed today.  Plan: Followup in 2 weeks to recheck incision. If it is healing well that point we will consider a prosthetic leg for him.  Fabienne Bruns, MD [facility] Office: [phone] Pager: [phone]

## 2011-07-12 ENCOUNTER — Telehealth: Payer: Self-pay | Admitting: *Deleted

## 2011-07-12 NOTE — Telephone Encounter (Signed)
PT/INR today  2.2  Need Coumadin dose

## 2011-07-13 NOTE — Telephone Encounter (Signed)
Notified Aaron Long by personally identifiable voicemail that Aaron Long is to continue same dosing schedule and to draw another PT/INR on Wednesday. I also calle the [place] residence and told them to stay on the same coumadin dose schedule as before.

## 2011-07-14 ENCOUNTER — Telehealth: Payer: Self-pay | Admitting: Physical Medicine & Rehabilitation

## 2011-07-14 NOTE — Telephone Encounter (Signed)
Please advise. I know that we have let [facility] know that this pt needs to get a PCP and I believe they have but the appointment isn't until next week.

## 2011-07-14 NOTE — Telephone Encounter (Signed)
Victorino Dike, RN [facility] - [phone]  Today INR-2.2.  Now 5 mg daily except Sat - 7.5.  Any changes in dosing?  Next draw?

## 2011-07-15 NOTE — Telephone Encounter (Signed)
Continue same warfarin dose and sig

## 2011-07-16 NOTE — Telephone Encounter (Signed)
Jennifer aware.  

## 2011-07-19 ENCOUNTER — Emergency Department (HOSPITAL_COMMUNITY)
Admission: EM | Admit: 2011-07-19 | Discharge: 2011-07-20 | Disposition: A | Discharge status: Home / Self Care | Payer: Medicare Other | Attending: Emergency Medicine | Admitting: Emergency Medicine

## 2011-07-19 ENCOUNTER — Emergency Department (HOSPITAL_COMMUNITY): Payer: Medicare Other

## 2011-07-19 ENCOUNTER — Encounter (HOSPITAL_COMMUNITY): Payer: Self-pay | Admitting: *Deleted

## 2011-07-19 DIAGNOSIS — R112 Nausea with vomiting, unspecified: Secondary | ICD-10-CM | POA: Insufficient documentation

## 2011-07-19 DIAGNOSIS — R1110 Vomiting, unspecified: Secondary | ICD-10-CM | POA: Diagnosis not present

## 2011-07-19 DIAGNOSIS — Z8546 Personal history of malignant neoplasm of prostate: Secondary | ICD-10-CM | POA: Insufficient documentation

## 2011-07-19 DIAGNOSIS — I2510 Atherosclerotic heart disease of native coronary artery without angina pectoris: Secondary | ICD-10-CM | POA: Diagnosis not present

## 2011-07-19 DIAGNOSIS — I10 Essential (primary) hypertension: Secondary | ICD-10-CM | POA: Diagnosis not present

## 2011-07-19 DIAGNOSIS — Z79899 Other long term (current) drug therapy: Secondary | ICD-10-CM | POA: Diagnosis not present

## 2011-07-19 DIAGNOSIS — Z7982 Long term (current) use of aspirin: Secondary | ICD-10-CM | POA: Insufficient documentation

## 2011-07-19 DIAGNOSIS — K5900 Constipation, unspecified: Secondary | ICD-10-CM | POA: Diagnosis not present

## 2011-07-19 DIAGNOSIS — E119 Type 2 diabetes mellitus without complications: Secondary | ICD-10-CM | POA: Insufficient documentation

## 2011-07-19 DIAGNOSIS — E785 Hyperlipidemia, unspecified: Secondary | ICD-10-CM | POA: Insufficient documentation

## 2011-07-19 DIAGNOSIS — I252 Old myocardial infarction: Secondary | ICD-10-CM | POA: Insufficient documentation

## 2011-07-19 DIAGNOSIS — I739 Peripheral vascular disease, unspecified: Secondary | ICD-10-CM | POA: Insufficient documentation

## 2011-07-19 LAB — CBC
HCT: 33.1 % — ABNORMAL LOW (ref 39.0–52.0)
MCHC: 32.3 g/dL (ref 30.0–36.0)
MCV: 89 fL (ref 78.0–100.0)
RDW: 14.3 % (ref 11.5–15.5)

## 2011-07-19 LAB — LIPASE, BLOOD: Lipase: 30 U/L (ref 11–59)

## 2011-07-19 LAB — COMPREHENSIVE METABOLIC PANEL
Albumin: 3.4 g/dL — ABNORMAL LOW (ref 3.5–5.2)
BUN: 27 mg/dL — ABNORMAL HIGH (ref 6–23)
Creatinine, Ser: 1.86 mg/dL — ABNORMAL HIGH (ref 0.50–1.35)
GFR calc Af Amer: 40 mL/min — ABNORMAL LOW (ref >90)
Total Bilirubin: 0.3 mg/dL (ref 0.3–1.2)
Total Protein: 7.5 g/dL (ref 6.0–8.3)

## 2011-07-19 MED ORDER — ONDANSETRON HCL 4 MG/2ML IJ SOLN
4.0000 mg | Freq: Once | INTRAMUSCULAR | Status: AC
  Administered 2011-07-19: 4 mg via INTRAVENOUS
  Filled 2011-07-19: qty 2

## 2011-07-19 MED ORDER — FLEET ENEMA 7-19 GM/118ML RE ENEM
1.0000 | ENEMA | Freq: Once | RECTAL | Status: AC
  Administered 2011-07-19: 1 via RECTAL
  Filled 2011-07-19: qty 1

## 2011-07-19 MED ORDER — SODIUM CHLORIDE 0.9 % IV BOLUS (SEPSIS)
500.0000 mL | Freq: Once | INTRAVENOUS | Status: AC
  Administered 2011-07-19: 500 mL via INTRAVENOUS

## 2011-07-19 NOTE — ED Notes (Signed)
Pt c/o no bowel movement x 4 days; took magnesium citrate without results today.  Vomited x 1 today; nauseated now

## 2011-07-20 LAB — POTASSIUM: Potassium: 5.4 mEq/L — ABNORMAL HIGH (ref 3.5–5.1)

## 2011-07-20 MED ORDER — DOCUSATE SODIUM 100 MG PO CAPS: 100.0000 mg | ORAL_CAPSULE | Freq: Two times a day (BID) | ORAL | Status: AC

## 2011-07-20 MED ORDER — ONDANSETRON 8 MG PO TBDP: 8.0000 mg | ORAL_TABLET | Freq: Three times a day (TID) | ORAL | Status: DC | PRN

## 2011-07-20 NOTE — ED Provider Notes (Signed)
History     CSN: [id number]  Arrival date & time 07/19/11  2137   First MD Initiated Contact with Patient 07/19/11 2311      Chief Complaint  Patient presents with  . Emesis    (Consider location/radiation/quality/duration/timing/severity/associated sxs/prior treatment) The history is provided by the patient.   the patient reports no bowel movement for 4 days.  Today he did get nauseated and vomited one time.  He denies abdominal pain.  His had no fevers or chills.  He denies melena or hematochezia.  The patient is on pain medication at home for chronic pain.  He reports taking MiraLAX daily without improvement in his symptoms.  He has not tried stool softeners nor has he tried an enema.  Nothing worsens the symptoms.  Nothing improves his symptoms.  Symptoms are constant and moderate in severity  Past Medical History  Diagnosis Date  . Coronary artery disease   . Prostate cancer 2010  . PAD (peripheral artery disease)   . Gangrene of toe     dry  . Neuromuscular disorder     diabetic neuropathy  . Hypertension     takes Amlodipine daily and Metoprolol bid  . Hyperlipidemia     takes Pravastatin daily  . Myocardial infarction 2005  . Peripheral neuropathy   . Constipation     takes Miralax daily  . Hemorrhoids   . Hx of colonic polyps   . History of kidney stones   . Diabetes mellitus     Novolog and Levemir daily    Past Surgical History  Procedure Date  . Prostate surgery 2009    prostatectomy  . Knee cartilage surgery at age 31    left knee  . Cea     Right  . Coronary artery bypass graft 2005    x 4  . Carotid endarterectomy 2005    right  . Cataract surgery 2011    bilateral  . Colonoscopy   . Cardiac catheterization 05/28/11  . Amputation 06/14/2011    Procedure: AMPUTATION DIGIT;  Surgeon: Sherren Kerns, MD;  Location: [facility] OR;  Service: Vascular;  Laterality: Left;  Amputation Left fifth toe  . Amputation 06/16/2011    Procedure: AMPUTATION BELOW KNEE;   Surgeon: Sherren Kerns, MD;  Location[facility];  Service: Vascular;  Laterality: Left;    Family History  Problem Relation Age of Onset  . Coronary artery disease Neg Hx   . Anesthesia problems Neg Hx   . Hypotension Neg Hx   . Malignant hyperthermia Neg Hx   . Pseudochol deficiency Neg Hx     History  Substance Use Topics  . Smoking status: Former Smoker -- 1.0 packs/day for 40 years    Types: Cigarettes    Quit date: 04/28/1991  . Smokeless tobacco: Never Used  . Alcohol Use: No     occasional      Review of Systems  All other systems reviewed and are negative.    Allergies  Review of patient's allergies indicates no known allergies.  Home Medications   Current Outpatient Rx  Name Route Sig Dispense Refill  . AMLODIPINE BESYLATE 10 MG PO TABS Oral Take 10 mg by mouth every morning.     . ASPIRIN EC 81 MG PO TBEC Oral Take 81 mg by mouth every morning.     [name] DOXAZOSIN MESYLATE 1 MG PO TABS Oral Take 1 tablet (1 mg total) by mouth at bedtime. 30 tablet 1  . INSULIN  DETEMIR 100 UNIT/ML [place] SOLN Subcutaneous Inject 10 Units into the skin at bedtime. 10 mL 1  . POLYETHYLENE GLYCOL 3350 PO PACK Oral Take 17 g by mouth daily.    [name] PRAVASTATIN SODIUM 80 MG PO TABS Oral Take 80 mg by mouth every morning.     [name] PREGABALIN 100 MG PO CAPS Oral Take 1 capsule (100 mg total) by mouth 2 (two) times daily. 60 capsule 1  . WARFARIN SODIUM 5 MG PO TABS Oral Take 1 tablet (5 mg total) by mouth daily at 6 PM. 30 tablet 1  . DOCUSATE SODIUM 100 MG PO CAPS Oral Take 1 capsule (100 mg total) by mouth every 12 (twelve) hours. 60 capsule 0  . ONDANSETRON 8 MG PO TBDP Oral Take 1 tablet (8 mg total) by mouth every 8 (eight) hours as needed for nausea. 20 tablet 0    BP 170/77  Pulse 90  Temp 99.5 F (37.5 C) (Oral)  Resp 18  SpO2 99%  Physical Exam  Nursing note and vitals reviewed. Constitutional: He is oriented to person, place, and time. He appears well-developed and  well-nourished.  HENT:  Head: Normocephalic and atraumatic.  Eyes: EOM are normal.  Neck: Normal range of motion.  Cardiovascular: Normal rate, regular rhythm, normal heart sounds and intact distal pulses.   Pulmonary/Chest: Effort normal and breath sounds normal. No respiratory distress.  Abdominal: Soft. He exhibits no distension. There is no tenderness.  Genitourinary:       Soft stool rectum without fecal impaction.  Stools normal color.  No gross blood   Musculoskeletal: Normal range of motion.  Neurological: He is alert and oriented to person, place, and time.  Skin: Skin is warm and dry.  Psychiatric: He has a normal mood and affect. Judgment normal.    ED Course  Procedures (including critical care time)  Labs Reviewed  CBC - Abnormal; Notable for the following:    RBC 3.72 (*)     Hemoglobin 10.7 (*)     HCT 33.1 (*)     All other components within normal limits  COMPREHENSIVE METABOLIC PANEL - Abnormal; Notable for the following:    Potassium 5.7 (*)     Glucose, Bld 218 (*)     BUN 27 (*)     Creatinine, Ser 1.86 (*)     Albumin 3.4 (*)     Alkaline Phosphatase 128 (*)     GFR calc non Af Amer 35 (*)     GFR calc Af Amer 40 (*)     All other components within normal limits  POTASSIUM - Abnormal; Notable for the following:    Potassium 5.4 (*)     All other components within normal limits  LIPASE, BLOOD   Dg Abd 2 Views  07/19/2011  *RADIOLOGY REPORT*  Clinical Data: Vomiting  ABDOMEN - 2 VIEW  Comparison: 05/05/2010  Findings: No free intraperitoneal gas on the decubitus image.  No disproportionate dilatation of bowel.  Nonspecific air fluid levels.  Postoperative changes in the pelvis.  IMPRESSION: Nonobstructive bowel gas pattern.  No evidence of free intraperitoneal gas.  Original Report Authenticated By: Donavan Burnet, M.D.    I personally reviewed the imaging tests through PACS system  I reviewed available ER/hospitalization records thought the  EMR   1. Constipation       MDM  Patient with complete resolution of his symptoms after fleets enema was given a large bowel movement was passed.  [name] abdomen  is benign.  His plain films demonstrate no obstructive pattern.  His labs are without significant abnormality.  There is a very mild elevation in his potassium but his BUN and creatinine his baseline for him.  Discharge home with close PCP followup.  Patient will begin taking Colace twice daily scheduled and was encouraged to continue oral hydration        Lyanne Co, MD 07/20/11 0040

## 2011-07-21 ENCOUNTER — Encounter: Payer: Self-pay | Admitting: Vascular Surgery

## 2011-07-21 DIAGNOSIS — I80299 Phlebitis and thrombophlebitis of other deep vessels of unspecified lower extremity: Secondary | ICD-10-CM | POA: Diagnosis not present

## 2011-07-21 DIAGNOSIS — Z0271 Encounter for disability determination: Secondary | ICD-10-CM | POA: Diagnosis not present

## 2011-07-21 DIAGNOSIS — S88119A Complete traumatic amputation at level between knee and ankle, unspecified lower leg, initial encounter: Secondary | ICD-10-CM | POA: Diagnosis not present

## 2011-07-21 DIAGNOSIS — E1142 Type 2 diabetes mellitus with diabetic polyneuropathy: Secondary | ICD-10-CM

## 2011-07-21 DIAGNOSIS — E1149 Type 2 diabetes mellitus with other diabetic neurological complication: Secondary | ICD-10-CM

## 2011-07-21 DIAGNOSIS — E785 Hyperlipidemia, unspecified: Secondary | ICD-10-CM | POA: Diagnosis not present

## 2011-07-21 DIAGNOSIS — M899 Disorder of bone, unspecified: Secondary | ICD-10-CM | POA: Diagnosis not present

## 2011-07-21 DIAGNOSIS — I739 Peripheral vascular disease, unspecified: Secondary | ICD-10-CM

## 2011-07-21 DIAGNOSIS — E1351 Other specified diabetes mellitus with diabetic peripheral angiopathy without gangrene: Secondary | ICD-10-CM | POA: Diagnosis not present

## 2011-07-21 DIAGNOSIS — I2510 Atherosclerotic heart disease of native coronary artery without angina pectoris: Secondary | ICD-10-CM | POA: Diagnosis not present

## 2011-07-21 DIAGNOSIS — N400 Enlarged prostate without lower urinary tract symptoms: Secondary | ICD-10-CM | POA: Diagnosis not present

## 2011-07-22 ENCOUNTER — Encounter: Payer: Self-pay | Admitting: Vascular Surgery

## 2011-07-22 ENCOUNTER — Ambulatory Visit (INDEPENDENT_AMBULATORY_CARE_PROVIDER_SITE_OTHER): Payer: Medicare Other | Admitting: Vascular Surgery

## 2011-07-22 VITALS — BP 134/59 | HR 77 | Temp 98.6°F | Ht 68.0 in | Wt 209.0 lb

## 2011-07-22 DIAGNOSIS — I70219 Atherosclerosis of native arteries of extremities with intermittent claudication, unspecified extremity: Secondary | ICD-10-CM

## 2011-07-22 NOTE — Progress Notes (Signed)
Patient is status post left below-knee dictation on 06/16/2011. He returns today for followup. He has had some difficulty healing up the lateral aspect of the wound. He still reports a small amount drainage from this on daily basis. He denies any fever or chills. The drainage is fairly clear.  Physical exam: Filed Vitals:   07/22/11 0936  BP: 134/59  Pulse: 77  Temp: 98.6 F (37 C)  TempSrc: Oral  Height: 5\' 8"  (1.727 m)  Weight: 209 lb (94.802 kg)  SpO2: 99%   Right lower extremity foot is pink and warm with no ulceration no palpable pedal pulses  Left lower extremity left BKA is healing well except for the lateral aspect there is a 2 cm open area but there is some good granulation tissue at the base of this. The depth is less than 2 mm. Skin: All skin below the knee on the left side is quite dry with scaling  Assessment: Healing left below-knee amputation  Plan: Local wound care left below-knee dictation with antibiotic ointment once daily moisturizing lotion and soap and water clean once daily patient was also given a prescription for shrinker today evaluation for prosthetic.  The patient will followup in 3 weeks to recheck the lateral aspect of his BKA  Fabienne Bruns, MD [facility] [place] Office: [phone] Pager: [phone]

## 2011-07-27 ENCOUNTER — Encounter: Payer: Medicare Other | Attending: Physical Medicine & Rehabilitation | Admitting: Physical Medicine & Rehabilitation

## 2011-07-27 ENCOUNTER — Encounter: Payer: Self-pay | Admitting: Physical Medicine & Rehabilitation

## 2011-07-27 VITALS — BP 143/53 | HR 89 | Resp 14 | Ht 68.0 in | Wt 212.0 lb

## 2011-07-27 DIAGNOSIS — I10 Essential (primary) hypertension: Secondary | ICD-10-CM | POA: Diagnosis not present

## 2011-07-27 DIAGNOSIS — Z951 Presence of aortocoronary bypass graft: Secondary | ICD-10-CM | POA: Diagnosis not present

## 2011-07-27 DIAGNOSIS — Z9079 Acquired absence of other genital organ(s): Secondary | ICD-10-CM | POA: Diagnosis not present

## 2011-07-27 DIAGNOSIS — E118 Type 2 diabetes mellitus with unspecified complications: Secondary | ICD-10-CM

## 2011-07-27 DIAGNOSIS — Z89519 Acquired absence of unspecified leg below knee: Secondary | ICD-10-CM

## 2011-07-27 DIAGNOSIS — E785 Hyperlipidemia, unspecified: Secondary | ICD-10-CM | POA: Insufficient documentation

## 2011-07-27 DIAGNOSIS — I252 Old myocardial infarction: Secondary | ICD-10-CM | POA: Insufficient documentation

## 2011-07-27 DIAGNOSIS — I2510 Atherosclerotic heart disease of native coronary artery without angina pectoris: Secondary | ICD-10-CM | POA: Diagnosis not present

## 2011-07-27 DIAGNOSIS — Z79899 Other long term (current) drug therapy: Secondary | ICD-10-CM | POA: Diagnosis not present

## 2011-07-27 DIAGNOSIS — Z794 Long term (current) use of insulin: Secondary | ICD-10-CM | POA: Insufficient documentation

## 2011-07-27 DIAGNOSIS — I739 Peripheral vascular disease, unspecified: Secondary | ICD-10-CM | POA: Insufficient documentation

## 2011-07-27 DIAGNOSIS — S88119A Complete traumatic amputation at level between knee and ankle, unspecified lower leg, initial encounter: Secondary | ICD-10-CM | POA: Insufficient documentation

## 2011-07-27 DIAGNOSIS — Z8546 Personal history of malignant neoplasm of prostate: Secondary | ICD-10-CM | POA: Insufficient documentation

## 2011-07-27 DIAGNOSIS — L989 Disorder of the skin and subcutaneous tissue, unspecified: Secondary | ICD-10-CM | POA: Diagnosis not present

## 2011-07-27 DIAGNOSIS — M79609 Pain in unspecified limb: Secondary | ICD-10-CM | POA: Insufficient documentation

## 2011-07-27 DIAGNOSIS — Z7901 Long term (current) use of anticoagulants: Secondary | ICD-10-CM | POA: Insufficient documentation

## 2011-07-27 DIAGNOSIS — E1149 Type 2 diabetes mellitus with other diabetic neurological complication: Secondary | ICD-10-CM | POA: Insufficient documentation

## 2011-07-27 DIAGNOSIS — I824Y9 Acute embolism and thrombosis of unspecified deep veins of unspecified proximal lower extremity: Secondary | ICD-10-CM | POA: Insufficient documentation

## 2011-07-27 DIAGNOSIS — E1142 Type 2 diabetes mellitus with diabetic polyneuropathy: Secondary | ICD-10-CM | POA: Insufficient documentation

## 2011-07-27 DIAGNOSIS — E1140 Type 2 diabetes mellitus with diabetic neuropathy, unspecified: Secondary | ICD-10-CM

## 2011-07-27 MED ORDER — OXYCODONE HCL 5 MG PO TABS: 5.0000 mg | ORAL_TABLET | ORAL | Status: DC | PRN

## 2011-07-27 MED ORDER — PREGABALIN 100 MG PO CAPS: 100.0000 mg | ORAL_CAPSULE | Freq: Two times a day (BID) | ORAL | Status: DC

## 2011-07-27 NOTE — Patient Instructions (Signed)
Continue with your current wound care regimen. No prosthetic wear until your leg is completely healed.

## 2011-07-27 NOTE — Progress Notes (Signed)
Subjective:    Patient ID: Aaron Long, male    DOB: [birth year], 71 y.o.   MRN: [id number]  HPI  Aaron Long is back regarding his left BKA. His pain is most severe in the residual limb. He's having minimal phantom limb pain. Pain usually is worst at night and first thing in the morning. The oxycodone we rx'ed him seems to help, but he has been running low on them the last week and has tried to "ration" them a bit.  His wounds are gradually healing. He does have a new sore on the right foot. He has seen biotech for a stump shrinker which he wears daily. He uses kerlix to cover the wound itself.  He dos occasionally walk short dx around home. He uses his wheelchair to move around when he's alone. His wife states that he doesn't always put his shoe on when he is propelling himself in chair.    Pain Inventory Average Pain 7 Pain Right Now 7 My pain is sharpsd  In the last 24 hours, has pain interfered with the following? General activity 8 Relation with others 9 Enjoyment of life 8 What TIME of day is your pain at its worst? night Sleep (in general) Fair  Pain is worse with: some activites Pain improves with: pacing activities Relief from Meds: 7  Mobility walk with assistance use a walker ability to climb steps?  no do you drive?  no use a wheelchair needs help with transfers  Function retired I need assistance with the following:  dressing, bathing, toileting and meal prep  Neuro/Psych anxiety  Prior Studies Any changes since last visit?  no  Physicians involved in your care Any changes since last visit?  no   Family History  Problem Relation Age of Onset  . Coronary artery disease Neg Hx   . Anesthesia problems Neg Hx   . Hypotension Neg Hx   . Malignant hyperthermia Neg Hx   . Pseudochol deficiency Neg Hx    History   Social History  . Marital Status: Married    Spouse Name: N/A    Number of Children: 3  . Years of Education: N/A    Occupational History  . Retired-[employer]    Social History Main Topics  . Smoking status: Former Smoker -- 1.0 packs/day for 40 years    Types: Cigarettes    Quit date: 04/28/1991  . Smokeless tobacco: Never Used  . Alcohol Use: No     occasional  . Drug Use: No  . Sexually Active: Not Currently   Other Topics Concern  . None   Social History Narrative  . None   Past Surgical History  Procedure Date  . Prostate surgery 2009    prostatectomy  . Knee cartilage surgery at age 66    left knee  . Cea     Right  . Coronary artery bypass graft 2005    x 4  . Carotid endarterectomy 2005    right  . Cataract surgery 2011    bilateral  . Colonoscopy   . Cardiac catheterization 05/28/11  . Amputation 06/14/2011    Procedure: AMPUTATION DIGIT;  Surgeon: Sherren Kerns, MD;  Location: [facility] OR;  Service: Vascular;  Laterality: Left;  Amputation Left fifth toe  . Amputation 06/16/2011    Procedure: AMPUTATION BELOW KNEE;  Surgeon: Sherren Kerns, MD;  Location: [facility] OR;  Service: Vascular;  Laterality: Left;   Past Medical History  Diagnosis Date  .  Coronary artery disease   . Prostate cancer 2010  . PAD (peripheral artery disease)   . Gangrene of toe     dry  . Neuromuscular disorder     diabetic neuropathy  . Hypertension     takes Amlodipine daily and Metoprolol bid  . Hyperlipidemia     takes Pravastatin daily  . Myocardial infarction 2005  . Peripheral neuropathy   . Constipation     takes Miralax daily  . Hemorrhoids   . Hx of colonic polyps   . History of kidney stones   . Diabetes mellitus     Novolog and Levemir daily   BP 143/53  Pulse 89  Resp 14  Ht 5\' 8"  (1.727 m)  Wt 212 lb (96.163 kg)  BMI 32.23 kg/m2  SpO2 98%     Review of Systems  Musculoskeletal: Positive for gait problem.  Psychiatric/Behavioral: The patient is nervous/anxious.   All other systems reviewed and are negative.       Objective:   Physical Exam  General:  Alert and oriented x 3, No apparent distress HEENT: Head is normocephalic, atraumatic, PERRLA, EOMI, sclera anicteric, oral mucosa pink and moist, dentition intact, ext ear canals clear,  Neck: Supple without JVD or lymphadenopathy Heart: Reg rate and rhythm. No murmurs rubs or gallops Chest: CTA bilaterally without wheezes, rales, or rhonchi; no distress Abdomen: Soft, non-tender, non-distended, bowel sounds positive. Extremities: No clubbing, cyanosis, or edema. Pulses are 2+ Skin: Left BKA almost healed excetp for a small area of granulaion laterally.  miimal drainage seen. He has a small 1cm pressure sore on the 1st metatasal pad of the right foot. Neuro: Pt is cognitively appropriate with normal insight, memory, and awareness. Cranial nerves 2-12 are intact. Sensory exam notable for stocking glove sensory loss. Reflexes are 2+ in all 4's. Fine motor coordination is intact. No tremors. Motor function is grossly 5/5 in unaffected areas.  Musculoskeletal: Full ROM, No pain with AROM or PROM in the neck, trunk, or extremities. Posture appropriate Psych: Pt's affect is appropriate. Pt is cooperative         Assessment & Plan:  1. left BKA secondary to PVD 06/16/2011   -continue with stump shrinker.  -he will see [employer] in about 2 weeks, and i think he will be appropriate to begin prosthetic construction  -He is a Engineer, site  -Wrote rx for diabetic shoes. He's at high risk for complications in the RLE as well. 2. DVT Prophylaxis/Anticoagulation: DVT left popliteal vein. Coumadin therapeutic now.  3. Pain Management: oxycodone as needed. I refilled #60 today. I think this dose and amount is adequate for now. Lyrica 100mg  bid-continue for now.  4. Wound care- continue dry dressing daily. The area is healing nicely. 5. Diabetes mellitus with neuropathy-continue mgt per pcp. 6. Hypertension uncontrolled. Norvasc 10 mg daily. Monitor with increased mobility  7. Hyperlipidemia. Zocor 8. I'll  see him back in about 2 months.

## 2011-08-06 ENCOUNTER — Ambulatory Visit: Payer: Medicare Other | Attending: Vascular Surgery | Admitting: Physical Therapy

## 2011-08-06 DIAGNOSIS — R269 Unspecified abnormalities of gait and mobility: Secondary | ICD-10-CM | POA: Diagnosis not present

## 2011-08-06 DIAGNOSIS — IMO0001 Reserved for inherently not codable concepts without codable children: Secondary | ICD-10-CM | POA: Insufficient documentation

## 2011-08-06 DIAGNOSIS — R5381 Other malaise: Secondary | ICD-10-CM | POA: Diagnosis not present

## 2011-08-06 DIAGNOSIS — S88119A Complete traumatic amputation at level between knee and ankle, unspecified lower leg, initial encounter: Secondary | ICD-10-CM | POA: Diagnosis not present

## 2011-08-11 ENCOUNTER — Encounter: Payer: Self-pay | Admitting: Vascular Surgery

## 2011-08-12 ENCOUNTER — Ambulatory Visit: Payer: Medicare Other | Admitting: Vascular Surgery

## 2011-08-17 ENCOUNTER — Encounter: Payer: Self-pay | Admitting: Neurosurgery

## 2011-08-18 ENCOUNTER — Encounter: Payer: Self-pay | Admitting: Neurosurgery

## 2011-08-18 ENCOUNTER — Ambulatory Visit (INDEPENDENT_AMBULATORY_CARE_PROVIDER_SITE_OTHER): Payer: Medicare Other | Admitting: Neurosurgery

## 2011-08-18 VITALS — BP 139/72 | HR 80 | Resp 16 | Ht 68.0 in | Wt 190.0 lb

## 2011-08-18 DIAGNOSIS — I70219 Atherosclerosis of native arteries of extremities with intermittent claudication, unspecified extremity: Secondary | ICD-10-CM

## 2011-08-18 DIAGNOSIS — I2510 Atherosclerotic heart disease of native coronary artery without angina pectoris: Secondary | ICD-10-CM | POA: Diagnosis not present

## 2011-08-18 DIAGNOSIS — E1351 Other specified diabetes mellitus with diabetic peripheral angiopathy without gangrene: Secondary | ICD-10-CM | POA: Diagnosis not present

## 2011-08-18 DIAGNOSIS — Z7901 Long term (current) use of anticoagulants: Secondary | ICD-10-CM | POA: Diagnosis not present

## 2011-08-18 DIAGNOSIS — IMO0002 Reserved for concepts with insufficient information to code with codable children: Secondary | ICD-10-CM | POA: Diagnosis not present

## 2011-08-18 NOTE — Progress Notes (Addendum)
Subjective:     Patient ID: Aaron Long, male   DOB: 04-Aug-1940, 71 y.o.   MRN: [id number]  HPI: 71 year old male patient of Dr. Darrick Penna who is status post left below the knee amputation 06/16/2011. He follows up today for a wound check do to some difficult healing early on. The patient reports no drainage at this point however it is noted there is a small amount on the gauze he took off the wound today. There is a very small approximately 1 cm dry open area medially and about the same laterally which both appear to be healing very well. There is no redness along the suture line however the patient does state he has continued pain which is probably mostly phantom pain at this point   Review of Systems: 12 point review of systems is notable for the difficulties described above otherwise unremarkable     Objective:   Physical Exam: Afebrile, vital signs are stable, wound is well healed except for the above 2 small open areas which are dry and intact.     Assessment:     Patient with healing left below the knee amputation currently wearing shrinker and was encouraged to keep some time of gauze are pad between the 2 little open areas and the shrinker    Plan:     The patient return in 3-4 weeks to see Dr. Darrick Penna for a final wound check, his questions were encouraged and answered, they're in agreement with this plan. Refill Vicodin 5/500 one by mouth twice a day when necessary 40 with no refill  [name] ANP  , M.D.: Edilia Bo

## 2011-08-25 DIAGNOSIS — Z7901 Long term (current) use of anticoagulants: Secondary | ICD-10-CM | POA: Diagnosis not present

## 2011-08-26 ENCOUNTER — Telehealth: Payer: Self-pay | Admitting: *Deleted

## 2011-08-26 NOTE — Telephone Encounter (Signed)
"  Mr Aaron Long is in so much pain. He is taking the oxy 5 mg. Please call".  I spoke with Mrs Aaron Long and moved Mr Aaron Long appointment with Dr Riley Kill up to Monday 08/30/11 from his September appointment. They will be here at 11:45.

## 2011-08-30 ENCOUNTER — Other Ambulatory Visit: Payer: Self-pay | Admitting: *Deleted

## 2011-08-30 ENCOUNTER — Encounter: Payer: Medicare Other | Admitting: Physical Medicine & Rehabilitation

## 2011-08-30 DIAGNOSIS — E118 Type 2 diabetes mellitus with unspecified complications: Secondary | ICD-10-CM

## 2011-08-30 DIAGNOSIS — Z7901 Long term (current) use of anticoagulants: Secondary | ICD-10-CM | POA: Diagnosis not present

## 2011-08-30 DIAGNOSIS — Z89519 Acquired absence of unspecified leg below knee: Secondary | ICD-10-CM

## 2011-08-30 MED ORDER — OXYCODONE HCL 5 MG PO TABS: 5.0000 mg | ORAL_TABLET | ORAL | Status: DC | PRN

## 2011-09-03 DIAGNOSIS — D649 Anemia, unspecified: Secondary | ICD-10-CM | POA: Diagnosis not present

## 2011-09-03 DIAGNOSIS — N2581 Secondary hyperparathyroidism of renal origin: Secondary | ICD-10-CM | POA: Diagnosis not present

## 2011-09-06 ENCOUNTER — Ambulatory Visit (INDEPENDENT_AMBULATORY_CARE_PROVIDER_SITE_OTHER): Payer: Medicare Other

## 2011-09-06 DIAGNOSIS — I10 Essential (primary) hypertension: Secondary | ICD-10-CM | POA: Diagnosis not present

## 2011-09-06 DIAGNOSIS — Z7901 Long term (current) use of anticoagulants: Secondary | ICD-10-CM | POA: Diagnosis not present

## 2011-09-07 DIAGNOSIS — I10 Essential (primary) hypertension: Secondary | ICD-10-CM | POA: Diagnosis not present

## 2011-09-07 DIAGNOSIS — R809 Proteinuria, unspecified: Secondary | ICD-10-CM | POA: Diagnosis not present

## 2011-09-07 DIAGNOSIS — E119 Type 2 diabetes mellitus without complications: Secondary | ICD-10-CM | POA: Diagnosis not present

## 2011-09-08 ENCOUNTER — Encounter: Payer: Self-pay | Admitting: Vascular Surgery

## 2011-09-09 ENCOUNTER — Ambulatory Visit: Payer: Medicare Other | Admitting: Vascular Surgery

## 2011-09-09 DIAGNOSIS — Z7901 Long term (current) use of anticoagulants: Secondary | ICD-10-CM | POA: Diagnosis not present

## 2011-09-09 DIAGNOSIS — I80299 Phlebitis and thrombophlebitis of other deep vessels of unspecified lower extremity: Secondary | ICD-10-CM | POA: Diagnosis not present

## 2011-09-09 DIAGNOSIS — IMO0002 Reserved for concepts with insufficient information to code with codable children: Secondary | ICD-10-CM | POA: Diagnosis not present

## 2011-09-09 DIAGNOSIS — S88119A Complete traumatic amputation at level between knee and ankle, unspecified lower leg, initial encounter: Secondary | ICD-10-CM | POA: Diagnosis not present

## 2011-09-10 ENCOUNTER — Encounter
Payer: Medicare Other | Attending: Physical Medicine and Rehabilitation | Admitting: Physical Medicine and Rehabilitation

## 2011-09-16 DIAGNOSIS — Z7901 Long term (current) use of anticoagulants: Secondary | ICD-10-CM | POA: Diagnosis not present

## 2011-09-23 DIAGNOSIS — E1351 Other specified diabetes mellitus with diabetic peripheral angiopathy without gangrene: Secondary | ICD-10-CM | POA: Diagnosis not present

## 2011-09-23 DIAGNOSIS — IMO0002 Reserved for concepts with insufficient information to code with codable children: Secondary | ICD-10-CM | POA: Diagnosis not present

## 2011-09-23 DIAGNOSIS — Z7901 Long term (current) use of anticoagulants: Secondary | ICD-10-CM | POA: Diagnosis not present

## 2011-09-24 ENCOUNTER — Ambulatory Visit: Payer: Medicare Other | Admitting: Physical Medicine & Rehabilitation

## 2011-09-27 DIAGNOSIS — Z23 Encounter for immunization: Secondary | ICD-10-CM | POA: Diagnosis not present

## 2011-09-27 DIAGNOSIS — Z7901 Long term (current) use of anticoagulants: Secondary | ICD-10-CM | POA: Diagnosis not present

## 2011-10-01 ENCOUNTER — Encounter: Payer: Medicare Other | Attending: Physical Medicine & Rehabilitation | Admitting: Physical Medicine & Rehabilitation

## 2011-10-01 DIAGNOSIS — L989 Disorder of the skin and subcutaneous tissue, unspecified: Secondary | ICD-10-CM | POA: Insufficient documentation

## 2011-10-01 DIAGNOSIS — M79609 Pain in unspecified limb: Secondary | ICD-10-CM | POA: Insufficient documentation

## 2011-10-01 DIAGNOSIS — I252 Old myocardial infarction: Secondary | ICD-10-CM | POA: Insufficient documentation

## 2011-10-01 DIAGNOSIS — E1142 Type 2 diabetes mellitus with diabetic polyneuropathy: Secondary | ICD-10-CM | POA: Insufficient documentation

## 2011-10-01 DIAGNOSIS — E785 Hyperlipidemia, unspecified: Secondary | ICD-10-CM | POA: Insufficient documentation

## 2011-10-01 DIAGNOSIS — I2510 Atherosclerotic heart disease of native coronary artery without angina pectoris: Secondary | ICD-10-CM | POA: Insufficient documentation

## 2011-10-01 DIAGNOSIS — I739 Peripheral vascular disease, unspecified: Secondary | ICD-10-CM | POA: Insufficient documentation

## 2011-10-01 DIAGNOSIS — Z8546 Personal history of malignant neoplasm of prostate: Secondary | ICD-10-CM | POA: Insufficient documentation

## 2011-10-01 DIAGNOSIS — I10 Essential (primary) hypertension: Secondary | ICD-10-CM | POA: Insufficient documentation

## 2011-10-01 DIAGNOSIS — S88119A Complete traumatic amputation at level between knee and ankle, unspecified lower leg, initial encounter: Secondary | ICD-10-CM | POA: Insufficient documentation

## 2011-10-01 DIAGNOSIS — Z79899 Other long term (current) drug therapy: Secondary | ICD-10-CM | POA: Insufficient documentation

## 2011-10-01 DIAGNOSIS — I824Y9 Acute embolism and thrombosis of unspecified deep veins of unspecified proximal lower extremity: Secondary | ICD-10-CM | POA: Insufficient documentation

## 2011-10-01 DIAGNOSIS — Z951 Presence of aortocoronary bypass graft: Secondary | ICD-10-CM | POA: Insufficient documentation

## 2011-10-01 DIAGNOSIS — Z9079 Acquired absence of other genital organ(s): Secondary | ICD-10-CM | POA: Insufficient documentation

## 2011-10-01 DIAGNOSIS — Z794 Long term (current) use of insulin: Secondary | ICD-10-CM | POA: Insufficient documentation

## 2011-10-01 DIAGNOSIS — Z7901 Long term (current) use of anticoagulants: Secondary | ICD-10-CM | POA: Insufficient documentation

## 2011-10-01 DIAGNOSIS — E1149 Type 2 diabetes mellitus with other diabetic neurological complication: Secondary | ICD-10-CM | POA: Insufficient documentation

## 2011-10-05 DIAGNOSIS — Z7901 Long term (current) use of anticoagulants: Secondary | ICD-10-CM | POA: Diagnosis not present

## 2011-10-12 DIAGNOSIS — Z7901 Long term (current) use of anticoagulants: Secondary | ICD-10-CM | POA: Diagnosis not present

## 2011-10-14 DIAGNOSIS — Z7901 Long term (current) use of anticoagulants: Secondary | ICD-10-CM | POA: Diagnosis not present

## 2011-10-19 DIAGNOSIS — IMO0002 Reserved for concepts with insufficient information to code with codable children: Secondary | ICD-10-CM | POA: Diagnosis not present

## 2011-10-19 DIAGNOSIS — M899 Disorder of bone, unspecified: Secondary | ICD-10-CM | POA: Diagnosis not present

## 2011-10-19 DIAGNOSIS — N189 Chronic kidney disease, unspecified: Secondary | ICD-10-CM | POA: Diagnosis not present

## 2011-10-19 DIAGNOSIS — E1351 Other specified diabetes mellitus with diabetic peripheral angiopathy without gangrene: Secondary | ICD-10-CM | POA: Diagnosis not present

## 2011-10-19 DIAGNOSIS — R269 Unspecified abnormalities of gait and mobility: Secondary | ICD-10-CM | POA: Diagnosis not present

## 2011-10-19 DIAGNOSIS — M949 Disorder of cartilage, unspecified: Secondary | ICD-10-CM | POA: Diagnosis not present

## 2011-10-19 DIAGNOSIS — Z7901 Long term (current) use of anticoagulants: Secondary | ICD-10-CM | POA: Diagnosis not present

## 2011-10-19 DIAGNOSIS — E559 Vitamin D deficiency, unspecified: Secondary | ICD-10-CM | POA: Diagnosis not present

## 2011-10-20 ENCOUNTER — Ambulatory Visit: Payer: Medicare Other | Attending: Vascular Surgery | Admitting: Physical Therapy

## 2011-10-20 DIAGNOSIS — IMO0001 Reserved for inherently not codable concepts without codable children: Secondary | ICD-10-CM | POA: Insufficient documentation

## 2011-10-20 DIAGNOSIS — R269 Unspecified abnormalities of gait and mobility: Secondary | ICD-10-CM | POA: Insufficient documentation

## 2011-10-20 DIAGNOSIS — S88119A Complete traumatic amputation at level between knee and ankle, unspecified lower leg, initial encounter: Secondary | ICD-10-CM | POA: Insufficient documentation

## 2011-10-20 DIAGNOSIS — R5381 Other malaise: Secondary | ICD-10-CM | POA: Insufficient documentation

## 2011-10-26 ENCOUNTER — Ambulatory Visit: Payer: Medicare Other | Admitting: Physical Therapy

## 2011-10-26 DIAGNOSIS — I80299 Phlebitis and thrombophlebitis of other deep vessels of unspecified lower extremity: Secondary | ICD-10-CM | POA: Diagnosis not present

## 2011-10-26 DIAGNOSIS — E1351 Other specified diabetes mellitus with diabetic peripheral angiopathy without gangrene: Secondary | ICD-10-CM | POA: Diagnosis not present

## 2011-10-26 DIAGNOSIS — R269 Unspecified abnormalities of gait and mobility: Secondary | ICD-10-CM | POA: Diagnosis not present

## 2011-10-26 DIAGNOSIS — IMO0002 Reserved for concepts with insufficient information to code with codable children: Secondary | ICD-10-CM | POA: Diagnosis not present

## 2011-10-28 ENCOUNTER — Ambulatory Visit: Payer: Medicare Other | Admitting: Physical Therapy

## 2011-11-02 ENCOUNTER — Ambulatory Visit: Payer: Medicare Other | Admitting: Physical Therapy

## 2011-11-02 DIAGNOSIS — Z7901 Long term (current) use of anticoagulants: Secondary | ICD-10-CM | POA: Diagnosis not present

## 2011-11-05 ENCOUNTER — Ambulatory Visit: Payer: Medicare Other | Admitting: Physical Therapy

## 2011-11-09 ENCOUNTER — Ambulatory Visit: Payer: Medicare Other | Admitting: Physical Therapy

## 2011-11-11 ENCOUNTER — Ambulatory Visit: Payer: Medicare Other | Admitting: Physical Therapy

## 2011-11-15 ENCOUNTER — Ambulatory Visit: Payer: Medicare Other | Attending: Vascular Surgery | Admitting: Physical Therapy

## 2011-11-15 DIAGNOSIS — R269 Unspecified abnormalities of gait and mobility: Secondary | ICD-10-CM | POA: Insufficient documentation

## 2011-11-15 DIAGNOSIS — IMO0001 Reserved for inherently not codable concepts without codable children: Secondary | ICD-10-CM | POA: Insufficient documentation

## 2011-11-15 DIAGNOSIS — R5381 Other malaise: Secondary | ICD-10-CM | POA: Diagnosis not present

## 2011-11-15 DIAGNOSIS — S88119A Complete traumatic amputation at level between knee and ankle, unspecified lower leg, initial encounter: Secondary | ICD-10-CM | POA: Insufficient documentation

## 2011-11-16 DIAGNOSIS — Z7901 Long term (current) use of anticoagulants: Secondary | ICD-10-CM | POA: Diagnosis not present

## 2011-11-18 ENCOUNTER — Encounter: Payer: Medicare Other | Admitting: Physical Therapy

## 2011-11-19 ENCOUNTER — Ambulatory Visit: Payer: Medicare Other | Admitting: Physical Therapy

## 2011-11-23 ENCOUNTER — Ambulatory Visit: Payer: Medicare Other | Admitting: Physical Therapy

## 2011-11-25 ENCOUNTER — Encounter: Payer: Medicare Other | Admitting: Physical Therapy

## 2011-11-30 ENCOUNTER — Ambulatory Visit: Payer: Medicare Other | Admitting: Physical Therapy

## 2011-12-02 ENCOUNTER — Encounter: Payer: Medicare Other | Admitting: Physical Therapy

## 2011-12-06 ENCOUNTER — Ambulatory Visit: Payer: Medicare Other | Admitting: Physical Therapy

## 2011-12-06 DIAGNOSIS — R809 Proteinuria, unspecified: Secondary | ICD-10-CM | POA: Diagnosis not present

## 2011-12-06 DIAGNOSIS — D649 Anemia, unspecified: Secondary | ICD-10-CM | POA: Diagnosis not present

## 2011-12-07 ENCOUNTER — Ambulatory Visit: Payer: Medicare Other | Admitting: Physical Therapy

## 2011-12-08 DIAGNOSIS — R809 Proteinuria, unspecified: Secondary | ICD-10-CM | POA: Diagnosis not present

## 2011-12-08 DIAGNOSIS — E119 Type 2 diabetes mellitus without complications: Secondary | ICD-10-CM | POA: Diagnosis not present

## 2011-12-08 DIAGNOSIS — I10 Essential (primary) hypertension: Secondary | ICD-10-CM | POA: Diagnosis not present

## 2011-12-14 ENCOUNTER — Encounter: Payer: Medicare Other | Admitting: Physical Therapy

## 2011-12-14 ENCOUNTER — Ambulatory Visit: Payer: Medicare Other | Attending: Vascular Surgery | Admitting: Physical Therapy

## 2011-12-14 DIAGNOSIS — R5381 Other malaise: Secondary | ICD-10-CM | POA: Diagnosis not present

## 2011-12-14 DIAGNOSIS — IMO0001 Reserved for inherently not codable concepts without codable children: Secondary | ICD-10-CM | POA: Insufficient documentation

## 2011-12-14 DIAGNOSIS — S88119A Complete traumatic amputation at level between knee and ankle, unspecified lower leg, initial encounter: Secondary | ICD-10-CM | POA: Insufficient documentation

## 2011-12-14 DIAGNOSIS — R269 Unspecified abnormalities of gait and mobility: Secondary | ICD-10-CM | POA: Diagnosis not present

## 2011-12-16 ENCOUNTER — Ambulatory Visit: Payer: Medicare Other | Admitting: Physical Therapy

## 2011-12-21 ENCOUNTER — Ambulatory Visit: Payer: Medicare Other | Admitting: Physical Therapy

## 2011-12-23 ENCOUNTER — Ambulatory Visit: Payer: Medicare Other | Admitting: Physical Therapy

## 2011-12-23 DIAGNOSIS — I10 Essential (primary) hypertension: Secondary | ICD-10-CM | POA: Diagnosis not present

## 2012-01-03 ENCOUNTER — Ambulatory Visit: Payer: Medicare Other | Admitting: Physical Therapy

## 2012-01-07 ENCOUNTER — Encounter: Payer: Self-pay | Admitting: Vascular Surgery

## 2012-01-18 DIAGNOSIS — Z7901 Long term (current) use of anticoagulants: Secondary | ICD-10-CM | POA: Diagnosis not present

## 2012-01-18 DIAGNOSIS — E1351 Other specified diabetes mellitus with diabetic peripheral angiopathy without gangrene: Secondary | ICD-10-CM | POA: Diagnosis not present

## 2012-01-18 DIAGNOSIS — IMO0002 Reserved for concepts with insufficient information to code with codable children: Secondary | ICD-10-CM | POA: Diagnosis not present

## 2012-01-18 DIAGNOSIS — E559 Vitamin D deficiency, unspecified: Secondary | ICD-10-CM | POA: Diagnosis not present

## 2012-01-19 ENCOUNTER — Telehealth: Payer: Self-pay

## 2012-01-19 NOTE — Telephone Encounter (Signed)
Wife called requesting appt.  Reports that pt. has been working with the therapist at [facility], and has had prosthesis left lower leg fitted multiple times; stated "they couldn't get the leg straight".  States that the therapist feels there is "fluid on leg near stump".  Recommended pt. get appt. with Dr. Darrick Penna to evaluate site.  Wife denies any redness or open sores on left BKA site.  States for approx. 3 wks. pt. has c/o soreness on (L) side of stump.  Requesting to sched. appt. to see Dr. Darrick Penna; stated "it's not an emergency".  Advised wife that will schedule appt. To have evaluated.

## 2012-01-19 NOTE — Telephone Encounter (Signed)
I scheduled an appt for the above patient on 01/27/12 at 8:30am w/ [facility] per Carol's instructions. The pt's wife is aware of the appointment.awt

## 2012-01-25 ENCOUNTER — Ambulatory Visit: Payer: Medicare Other | Attending: Vascular Surgery | Admitting: Physical Therapy

## 2012-01-25 DIAGNOSIS — R269 Unspecified abnormalities of gait and mobility: Secondary | ICD-10-CM | POA: Diagnosis not present

## 2012-01-25 DIAGNOSIS — R5381 Other malaise: Secondary | ICD-10-CM | POA: Insufficient documentation

## 2012-01-25 DIAGNOSIS — S88119A Complete traumatic amputation at level between knee and ankle, unspecified lower leg, initial encounter: Secondary | ICD-10-CM | POA: Insufficient documentation

## 2012-01-25 DIAGNOSIS — IMO0001 Reserved for inherently not codable concepts without codable children: Secondary | ICD-10-CM | POA: Insufficient documentation

## 2012-01-26 ENCOUNTER — Encounter: Payer: Self-pay | Admitting: Vascular Surgery

## 2012-01-27 ENCOUNTER — Ambulatory Visit (INDEPENDENT_AMBULATORY_CARE_PROVIDER_SITE_OTHER): Payer: Medicare Other | Admitting: Vascular Surgery

## 2012-01-27 ENCOUNTER — Encounter: Payer: Self-pay | Admitting: Vascular Surgery

## 2012-01-27 ENCOUNTER — Other Ambulatory Visit: Payer: Self-pay

## 2012-01-27 VITALS — BP 165/64 | HR 88 | Ht 68.0 in | Wt 213.0 lb

## 2012-01-27 DIAGNOSIS — I739 Peripheral vascular disease, unspecified: Secondary | ICD-10-CM

## 2012-01-27 NOTE — Progress Notes (Signed)
Patient is a 72 year old male who presents today for evaluation of his left below-knee amputation. He has developed a tender area over the anterior aspect over the last several weeks. It has now become difficult for him to wear his prosthesis. He denies any fever or chills or drainage. He denies any trauma to the stump. His amputation was in June of 2013.  Review of systems: Denies shortness of breath. Denies chest pain.  Physical exam: Filed Vitals:   01/27/12 0841  BP: 165/64  Pulse: 88  Height: 5\' 8"  (1.727 m)  Weight: 213 lb (96.616 kg)  SpO2: 96%   Left lower extremity: The left below-knee amputation is well-healed. There is a 4 cm fluctuant area over the distal tibia. There is no erythema. There is no open wound. This is not really tender to palpation.  Assessment: Painful fluctuant area left below-knee amputation most likely a seroma or hematoma related to his prosthetic use Plan: Incision and drainage of this area in the operating room on Monday, January 20. The plan will most likely be healing by secondary intention and he will need home health postoperatively. This will be scheduled as an outpatient procedure. It was discussed with the patient today that it may be 4-6 weeks before he can wear his prosthesis again.  Fabienne Bruns, MD [facility] Office: [phone] Pager: [phone]

## 2012-01-28 ENCOUNTER — Encounter (HOSPITAL_COMMUNITY): Payer: Self-pay

## 2012-01-28 ENCOUNTER — Encounter (HOSPITAL_COMMUNITY)
Admission: RE | Admit: 2012-01-28 | Discharge: 2012-01-28 | Disposition: A | Discharge status: Home / Self Care | Payer: Medicare Other | Source: Ambulatory Visit | Attending: Vascular Surgery | Admitting: Vascular Surgery

## 2012-01-28 ENCOUNTER — Encounter (HOSPITAL_COMMUNITY)
Admission: RE | Admit: 2012-01-28 | Discharge: 2012-01-28 | Disposition: A | Discharge status: Home / Self Care | Payer: Medicare Other | Source: Ambulatory Visit | Attending: Anesthesiology | Admitting: Anesthesiology

## 2012-01-28 DIAGNOSIS — Z7901 Long term (current) use of anticoagulants: Secondary | ICD-10-CM | POA: Diagnosis not present

## 2012-01-28 DIAGNOSIS — Z01812 Encounter for preprocedural laboratory examination: Secondary | ICD-10-CM | POA: Diagnosis not present

## 2012-01-28 DIAGNOSIS — Z951 Presence of aortocoronary bypass graft: Secondary | ICD-10-CM | POA: Diagnosis not present

## 2012-01-28 DIAGNOSIS — S88119A Complete traumatic amputation at level between knee and ankle, unspecified lower leg, initial encounter: Secondary | ICD-10-CM | POA: Diagnosis not present

## 2012-01-28 DIAGNOSIS — IMO0002 Reserved for concepts with insufficient information to code with codable children: Secondary | ICD-10-CM | POA: Diagnosis not present

## 2012-01-28 DIAGNOSIS — N289 Disorder of kidney and ureter, unspecified: Secondary | ICD-10-CM | POA: Diagnosis not present

## 2012-01-28 DIAGNOSIS — I2510 Atherosclerotic heart disease of native coronary artery without angina pectoris: Secondary | ICD-10-CM | POA: Diagnosis not present

## 2012-01-28 DIAGNOSIS — I739 Peripheral vascular disease, unspecified: Secondary | ICD-10-CM | POA: Diagnosis not present

## 2012-01-28 DIAGNOSIS — E119 Type 2 diabetes mellitus without complications: Secondary | ICD-10-CM | POA: Diagnosis not present

## 2012-01-28 DIAGNOSIS — I252 Old myocardial infarction: Secondary | ICD-10-CM | POA: Diagnosis not present

## 2012-01-28 DIAGNOSIS — I10 Essential (primary) hypertension: Secondary | ICD-10-CM | POA: Diagnosis not present

## 2012-01-28 DIAGNOSIS — Z01818 Encounter for other preprocedural examination: Secondary | ICD-10-CM | POA: Diagnosis not present

## 2012-01-28 DIAGNOSIS — Z01810 Encounter for preprocedural cardiovascular examination: Secondary | ICD-10-CM | POA: Diagnosis not present

## 2012-01-28 LAB — PROTIME-INR: INR: 1.98 — ABNORMAL HIGH (ref 0.00–1.49)

## 2012-01-28 LAB — SURGICAL PCR SCREEN: MRSA, PCR: POSITIVE — AB

## 2012-01-28 NOTE — Pre-Procedure Instructions (Addendum)
Aaron Long  01/28/2012   Your procedure is scheduled on: 01/31/12  Report to [facility] at530 AM.  Call this number if you have problems the morning of surgery: [phone]   Remember:   Do not eat food or drink liquids after midnight.   Take these medicines the morning of surgery with A SIP OF WATER: amlodipine,  pain med, lyrica  STOP coumadin per dr  No insulin ,asp am of surgery   Do not wear jewelry, make-up or nail polish.  Do not wear lotions, powders, or perfumes. You may wear deodorant.  Do not shave 48 hours prior to surgery. Men may shave face and neck.  Do not bring valuables to the hospital.  Contacts, dentures or bridgework may not be worn into surgery.  Leave suitcase in the car. After surgery it may be brought to your room.  For patients admitted to the hospital, checkout time is 11:00 AM the day of  discharge.   Patients discharged the day of surgery will not be allowed to drive  home.  Name and phone number of your driver: [name] [phone]  Special Instructions: Shower using CHG 2 nights before surgery and the night before surgery.  If you shower the day of surgery use CHG.  Use special wash - you have one bottle of CHG for all showers.  You should use approximately 1/3 of the bottle for each shower.   Please read over the following fact sheets that you were given: Pain Booklet, Coughing and Deep Breathing, MRSA Information and Surgical Site Infection Prevention

## 2012-01-28 NOTE — Progress Notes (Signed)
05/13/11 notes from last visit to dr Sanjuana Kava , 06/25/11 old ekg No visit since 5/13

## 2012-01-28 NOTE — Consult Note (Signed)
Anesthesia Chart Review:  Patient is a 72 year old male scheduled for I&D of his left BKA by Dr. Darrick Penna on 01/31/12.  He is s/p left BKA on 06/16/11.  History includes PAD, former smoker, HTN, DMT2, CAD s/p CABG X '07 (LIMA to LAD, SVG to DIAG, SVG to OM, SVT to dRCA) , HLD, obesity, prostate cancer.  OSA screening score was 4.  He was last seen by cardiologist Dr. Clifton James on 06/01/11 who ordered a stress test before patient underwent his previous vascular procedure.  EKG on 01/28/12 showed NSR, minimal voltage for LVH, non-specific T wave abnormality.   Nuclear stress test on 06/03/11 showed:There is no evidence of scar or ischemia. There is septal dyssynergy compatible with the history of CABG. LV Ejection Fraction: 51%. LV Wall Motion: There is dyssynergy of the septum compatible with history of CABG.  His last echo was on 04/12/05 and showed mild mid septal disproportionated upper septal thickening (DUST), EF 55%, diastolic relaxation abnormality, mild MR.  CXR on 01/28/12 showed no active disease.    Pre-operative labs noted. His PAT RN did not get a CBC and BMET at patient's PAT visit because the surgeon only asked for an ISTAT on the day of surgery and coags.  There are no extra tubes in the lab.  With his history of DM2, renal insufficiency, CAD I think he will need a CBC, BMET in addition to a repeat PT/INR on the day of surgery.  Coumadin was held starting 01/27/12.    If his labs are stable and no new CV symptoms then would anticipate he can proceed as planned.  Shonna Chock, PA-C 01/28/12 1019

## 2012-01-28 NOTE — Progress Notes (Signed)
01/28/12 0853  OBSTRUCTIVE SLEEP APNEA  Have you ever been diagnosed with sleep apnea through a sleep study? No  Do you snore loudly (loud enough to be heard through closed doors)?  1  Do you often feel tired, fatigued, or sleepy during the daytime? 0  Has anyone observed you stop breathing during your sleep? 0  Do you have, or are you being treated for high blood pressure? 1  BMI more than 35 kg/m2? 0  Age over 73 years old? 1  Neck circumference greater than 40 cm/18 inches? 0  Gender: 1  Obstructive Sleep Apnea Score 4   Score 4 or greater  Results sent to PCP

## 2012-01-30 MED ORDER — DEXTROSE 5 % IV SOLN
1.5000 g | INTRAVENOUS | Status: AC
  Administered 2012-01-31: 1.5 g via INTRAVENOUS
  Filled 2012-01-30: qty 1.5

## 2012-01-31 ENCOUNTER — Encounter (HOSPITAL_COMMUNITY): Payer: Self-pay | Admitting: Vascular Surgery

## 2012-01-31 ENCOUNTER — Ambulatory Visit (HOSPITAL_COMMUNITY)
Admission: RE | Admit: 2012-01-31 | Discharge: 2012-01-31 | Disposition: A | Discharge status: Home / Self Care | Payer: Medicare Other | Source: Ambulatory Visit | Attending: Vascular Surgery | Admitting: Vascular Surgery

## 2012-01-31 ENCOUNTER — Encounter (HOSPITAL_COMMUNITY)
Admission: RE | Disposition: A | Discharge status: Home / Self Care | Payer: Self-pay | Source: Ambulatory Visit | Attending: Vascular Surgery

## 2012-01-31 ENCOUNTER — Ambulatory Visit (HOSPITAL_COMMUNITY): Payer: Medicare Other | Admitting: Vascular Surgery

## 2012-01-31 ENCOUNTER — Telehealth: Payer: Self-pay

## 2012-01-31 DIAGNOSIS — T8789 Other complications of amputation stump: Secondary | ICD-10-CM | POA: Diagnosis not present

## 2012-01-31 DIAGNOSIS — E119 Type 2 diabetes mellitus without complications: Secondary | ICD-10-CM | POA: Insufficient documentation

## 2012-01-31 DIAGNOSIS — S88119A Complete traumatic amputation at level between knee and ankle, unspecified lower leg, initial encounter: Secondary | ICD-10-CM | POA: Diagnosis not present

## 2012-01-31 DIAGNOSIS — I739 Peripheral vascular disease, unspecified: Secondary | ICD-10-CM | POA: Insufficient documentation

## 2012-01-31 DIAGNOSIS — I10 Essential (primary) hypertension: Secondary | ICD-10-CM | POA: Insufficient documentation

## 2012-01-31 DIAGNOSIS — IMO0002 Reserved for concepts with insufficient information to code with codable children: Secondary | ICD-10-CM | POA: Diagnosis not present

## 2012-01-31 DIAGNOSIS — Z01812 Encounter for preprocedural laboratory examination: Secondary | ICD-10-CM | POA: Insufficient documentation

## 2012-01-31 DIAGNOSIS — Y835 Amputation of limb(s) as the cause of abnormal reaction of the patient, or of later complication, without mention of misadventure at the time of the procedure: Secondary | ICD-10-CM | POA: Insufficient documentation

## 2012-01-31 DIAGNOSIS — Z7901 Long term (current) use of anticoagulants: Secondary | ICD-10-CM | POA: Insufficient documentation

## 2012-01-31 DIAGNOSIS — N289 Disorder of kidney and ureter, unspecified: Secondary | ICD-10-CM | POA: Insufficient documentation

## 2012-01-31 DIAGNOSIS — Z951 Presence of aortocoronary bypass graft: Secondary | ICD-10-CM | POA: Insufficient documentation

## 2012-01-31 DIAGNOSIS — Z01810 Encounter for preprocedural cardiovascular examination: Secondary | ICD-10-CM | POA: Insufficient documentation

## 2012-01-31 DIAGNOSIS — I2510 Atherosclerotic heart disease of native coronary artery without angina pectoris: Secondary | ICD-10-CM | POA: Insufficient documentation

## 2012-01-31 DIAGNOSIS — C61 Malignant neoplasm of prostate: Secondary | ICD-10-CM | POA: Diagnosis not present

## 2012-01-31 DIAGNOSIS — I252 Old myocardial infarction: Secondary | ICD-10-CM | POA: Insufficient documentation

## 2012-01-31 DIAGNOSIS — Z01818 Encounter for other preprocedural examination: Secondary | ICD-10-CM | POA: Insufficient documentation

## 2012-01-31 HISTORY — PX: I&D EXTREMITY: SHX5045

## 2012-01-31 LAB — CBC
MCH: 29.4 pg (ref 26.0–34.0)
MCHC: 33 g/dL (ref 30.0–36.0)
Platelets: 186 10*3/uL (ref 150–400)
RDW: 14.6 % (ref 11.5–15.5)

## 2012-01-31 LAB — BASIC METABOLIC PANEL
Calcium: 9.2 mg/dL (ref 8.4–10.5)
GFR calc Af Amer: 28 mL/min — ABNORMAL LOW (ref >90)
GFR calc non Af Amer: 24 mL/min — ABNORMAL LOW (ref >90)
Glucose, Bld: 255 mg/dL — ABNORMAL HIGH (ref 70–99)
Potassium: 5.4 mEq/L — ABNORMAL HIGH (ref 3.5–5.1)
Sodium: 137 mEq/L (ref 135–145)

## 2012-01-31 LAB — GLUCOSE, CAPILLARY: Glucose-Capillary: 213 mg/dL — ABNORMAL HIGH (ref 70–99)

## 2012-01-31 SURGERY — IRRIGATION AND DEBRIDEMENT EXTREMITY
Anesthesia: General | Site: Leg Lower | Laterality: Left | Wound class: Clean

## 2012-01-31 MED ORDER — ONDANSETRON HCL 4 MG/2ML IJ SOLN: 4.0000 mg | Freq: Four times a day (QID) | INTRAMUSCULAR | Status: DC | PRN

## 2012-01-31 MED ORDER — LIDOCAINE HCL (CARDIAC) 20 MG/ML IV SOLN
INTRAVENOUS | Status: DC | PRN
  Administered 2012-01-31: 70 mg via INTRAVENOUS

## 2012-01-31 MED ORDER — FENTANYL CITRATE 0.05 MG/ML IJ SOLN
INTRAMUSCULAR | Status: AC
  Filled 2012-01-31: qty 2

## 2012-01-31 MED ORDER — EPHEDRINE SULFATE 50 MG/ML IJ SOLN
INTRAMUSCULAR | Status: DC | PRN
  Administered 2012-01-31: 10 mg via INTRAVENOUS

## 2012-01-31 MED ORDER — PROPOFOL 10 MG/ML IV BOLUS
INTRAVENOUS | Status: DC | PRN
  Administered 2012-01-31: 160 mg via INTRAVENOUS

## 2012-01-31 MED ORDER — SODIUM CHLORIDE 0.9 % IV SOLN: INTRAVENOUS | Status: DC

## 2012-01-31 MED ORDER — OXYCODONE HCL 5 MG PO TABS: 5.0000 mg | ORAL_TABLET | ORAL | Status: DC | PRN

## 2012-01-31 MED ORDER — LACTATED RINGERS IV SOLN
INTRAVENOUS | Status: DC | PRN
  Administered 2012-01-31: 07:00:00 via INTRAVENOUS

## 2012-01-31 MED ORDER — 0.9 % SODIUM CHLORIDE (POUR BTL) OPTIME
TOPICAL | Status: DC | PRN
  Administered 2012-01-31: 1000 mL

## 2012-01-31 MED ORDER — OXYCODONE HCL 5 MG/5ML PO SOLN: 5.0000 mg | Freq: Once | ORAL | Status: DC | PRN

## 2012-01-31 MED ORDER — FENTANYL CITRATE 0.05 MG/ML IJ SOLN
25.0000 ug | INTRAMUSCULAR | Status: DC | PRN
  Administered 2012-01-31 (×2): 50 ug via INTRAVENOUS

## 2012-01-31 MED ORDER — OXYCODONE HCL 5 MG PO TABS: 5.0000 mg | ORAL_TABLET | Freq: Once | ORAL | Status: DC | PRN

## 2012-01-31 SURGICAL SUPPLY — 36 items
BANDAGE ELASTIC 4 VELCRO ST LF (GAUZE/BANDAGES/DRESSINGS) ×1 IMPLANT
BANDAGE ELASTIC 6 VELCRO ST LF (GAUZE/BANDAGES/DRESSINGS) IMPLANT
BANDAGE GAUZE ELAST BULKY 4 IN (GAUZE/BANDAGES/DRESSINGS) ×1 IMPLANT
CANISTER SUCTION 2500CC (MISCELLANEOUS) ×2 IMPLANT
CLOTH BEACON ORANGE TIMEOUT ST (SAFETY) ×2 IMPLANT
COVER SURGICAL LIGHT HANDLE (MISCELLANEOUS) ×2 IMPLANT
DRAPE ORTHO SPLIT 77X108 STRL (DRAPES) ×4
DRAPE SURG ORHT 6 SPLT 77X108 (DRAPES) ×2 IMPLANT
ELECT REM PT RETURN 9FT ADLT (ELECTROSURGICAL) ×2
ELECTRODE REM PT RTRN 9FT ADLT (ELECTROSURGICAL) ×1 IMPLANT
GAUZE PACKING IODOFORM 1/2 (PACKING) ×2 IMPLANT
GLOVE BIO SURGEON STRL SZ7.5 (GLOVE) ×2 IMPLANT
GLOVE BIOGEL PI IND STRL 7.5 (GLOVE) IMPLANT
GLOVE BIOGEL PI IND STRL 8 (GLOVE) IMPLANT
GLOVE BIOGEL PI INDICATOR 7.5 (GLOVE) ×2
GLOVE BIOGEL PI INDICATOR 8 (GLOVE) ×1
GLOVE SURG SS PI 7.5 STRL IVOR (GLOVE) ×2 IMPLANT
GOWN PREVENTION PLUS XLARGE (GOWN DISPOSABLE) ×2 IMPLANT
GOWN STRL NON-REIN LRG LVL3 (GOWN DISPOSABLE) ×4 IMPLANT
KIT BASIN OR (CUSTOM PROCEDURE TRAY) ×2 IMPLANT
KIT ROOM TURNOVER OR (KITS) ×2 IMPLANT
NS IRRIG 1000ML POUR BTL (IV SOLUTION) ×2 IMPLANT
PACK GENERAL/GYN (CUSTOM PROCEDURE TRAY) ×1 IMPLANT
PACK UNIVERSAL I (CUSTOM PROCEDURE TRAY) ×1 IMPLANT
PAD ARMBOARD 7.5X6 YLW CONV (MISCELLANEOUS) ×4 IMPLANT
SPONGE GAUZE 4X4 12PLY (GAUZE/BANDAGES/DRESSINGS) ×2 IMPLANT
STAPLER VISISTAT 35W (STAPLE) IMPLANT
SUT ETHILON 3 0 PS 1 (SUTURE) IMPLANT
SUT VIC AB 2-0 CTX 36 (SUTURE) IMPLANT
SUT VIC AB 3-0 SH 27 (SUTURE)
SUT VIC AB 3-0 SH 27X BRD (SUTURE) IMPLANT
SUT VICRYL 4-0 PS2 18IN ABS (SUTURE) IMPLANT
SWAB COLLECTION DEVICE MRSA (MISCELLANEOUS) ×1 IMPLANT
TOWEL OR 17X24 6PK STRL BLUE (TOWEL DISPOSABLE) ×3 IMPLANT
TOWEL OR 17X26 10 PK STRL BLUE (TOWEL DISPOSABLE) ×2 IMPLANT
WATER STERILE IRR 1000ML POUR (IV SOLUTION) ×2 IMPLANT

## 2012-01-31 NOTE — Transfer of Care (Signed)
Immediate Anesthesia Transfer of Care Note  Patient: Aaron Long  Procedure(s) Performed: Procedure(s) (LRB) with comments: IRRIGATION AND DEBRIDEMENT EXTREMITY (Left) - I & D Left BKA   Patient Location: PACU  Anesthesia Type:General  Level of Consciousness: awake and alert   Airway & Oxygen Therapy: Patient Spontanous Breathing and Patient connected to face mask oxygen  Post-op Assessment: Report given to PACU RN and Post -op Vital signs reviewed and stable  Post vital signs: Reviewed and stable  Complications: No apparent anesthesia complications

## 2012-01-31 NOTE — Interval H&P Note (Signed)
History and Physical Interval Note:  01/31/2012 7:30 AM  Aaron Long  has presented today for surgery, with the diagnosis of Nonhealing surgical wound left below knee amputation  The various methods of treatment have been discussed with the patient and family. After consideration of risks, benefits and other options for treatment, the patient has consented to  Procedure(s) (LRB) with comments: IRRIGATION AND DEBRIDEMENT EXTREMITY (Left) - I & D Left BKA  as a surgical intervention .  [name] history has been reviewed, patient examined, no change in status, stable for surgery.  I have reviewed the patient's chart and labs.  Questions were answered to the patient's satisfaction.     Adlai Nieblas E

## 2012-01-31 NOTE — Progress Notes (Signed)
Orthopedic Tech Progress Note Patient Details:  Aaron Long Sep 11, 1940 [id number]  Ortho Devices Type of Ortho Device: Crutches Ortho Device/Splint Interventions: Application   [name] 01/31/2012, 10:55 AM

## 2012-01-31 NOTE — Telephone Encounter (Signed)
Rec'd verbal order from Dr. Darrick Penna to initate home health care for NS wet-to-dry Nu-gauze dressings BID.  Phone call to [facility].  Will fax orders wound care to [phone].

## 2012-01-31 NOTE — Anesthesia Preprocedure Evaluation (Addendum)
Anesthesia Evaluation  Patient identified by MRN, date of birth, ID band Patient awake    Reviewed: Allergy & Precautions, H&P , NPO status , Patient's Chart, lab work & pertinent test results  Airway Mallampati: II TM Distance: >3 FB Neck ROM: full    Dental  (+) Edentulous Upper   Pulmonary former smoker,          Cardiovascular hypertension, Pt. on medications + CAD, + Past MI, + CABG and + Peripheral Vascular Disease Rhythm:Regular Rate:Normal     Neuro/Psych  Neuromuscular disease    GI/Hepatic   Endo/Other  diabetes, Type 2, Insulin Dependentobese  Renal/GU Renal InsufficiencyRenal diseaseCreatinine 2.51     Musculoskeletal   Abdominal   Peds  Hematology   Anesthesia Other Findings   Reproductive/Obstetrics                         Anesthesia Physical Anesthesia Plan  ASA: III  Anesthesia Plan: General   Post-op Pain Management:    Induction: Intravenous  Airway Management Planned: LMA  Additional Equipment:   Intra-op Plan:   Post-operative Plan:   Informed Consent: I have reviewed the patients History and Physical, chart, labs and discussed the procedure including the risks, benefits and alternatives for the proposed anesthesia with the patient or authorized representative who has indicated his/her understanding and acceptance.   Dental advisory given  Plan Discussed with: CRNA and Surgeon  Anesthesia Plan Comments:        Anesthesia Quick Evaluation

## 2012-01-31 NOTE — Anesthesia Procedure Notes (Signed)
Procedure Name: LMA Insertion Date/Time: 01/31/2012 7:41 AM Performed by: Arlice Colt B Pre-anesthesia Checklist: Patient identified, Emergency Drugs available, Suction available, Patient being monitored and Timeout performed Patient Re-evaluated:Patient Re-evaluated prior to inductionOxygen Delivery Method: Circle system utilized Preoxygenation: Pre-oxygenation with 100% oxygen Intubation Type: IV induction LMA: LMA inserted LMA Size: 5.0 Number of attempts: 1 Placement Confirmation: positive ETCO2 and breath sounds checked- equal and bilateral Tube secured with: Tape Dental Injury: Teeth and Oropharynx as per pre-operative assessment

## 2012-01-31 NOTE — Anesthesia Postprocedure Evaluation (Signed)
Anesthesia Post Note  Patient: Aaron Long  Procedure(s) Performed: Procedure(s) (LRB): IRRIGATION AND DEBRIDEMENT EXTREMITY (Left)  Anesthesia type: General  Patient location: PACU  Post pain: Pain level controlled and Adequate analgesia  Post assessment: Post-op Vital signs reviewed, Patient's Cardiovascular Status Stable, Respiratory Function Stable, Patent Airway and Pain level controlled  Last Vitals:  Filed Vitals:   01/31/12 0900  BP: 167/68  Pulse: 77  Temp:   Resp: 13    Post vital signs: Reviewed and stable  Level of consciousness: awake, alert  and oriented  Complications: No apparent anesthesia complications

## 2012-01-31 NOTE — H&P (View-Only) (Signed)
Patient is a 71-year-old male who presents today for evaluation of his left below-knee amputation. He has developed a tender area over the anterior aspect over the last several weeks. It has now become difficult for him to wear his prosthesis. He denies any fever or chills or drainage. He denies any trauma to the stump. His amputation was in June of 2013.  Review of systems: Denies shortness of breath. Denies chest pain.  Physical exam: Filed Vitals:   01/27/12 0841  BP: 165/64  Pulse: 88  Height: 5' 8" (1.727 m)  Weight: 213 lb (96.616 kg)  SpO2: 96%   Left lower extremity: The left below-knee amputation is well-healed. There is a 4 cm fluctuant area over the distal tibia. There is no erythema. There is no open wound. This is not really tender to palpation.  Assessment: Painful fluctuant area left below-knee amputation most likely a seroma or hematoma related to his prosthetic use Plan: Incision and drainage of this area in the operating room on Monday, January 20. The plan will most likely be healing by secondary intention and he will need home health postoperatively. This will be scheduled as an outpatient procedure. It was discussed with the patient today that it may be 4-6 weeks before he can wear his prosthesis again.  Charles Fields, MD [facility] Office: [phone] Pager: [phone]  

## 2012-01-31 NOTE — Op Note (Signed)
Procedure: Incision and drainage left below-knee amputation  Preoperative diagnosis: Hematoma left below-knee amputation  Postoperative diagnosis: Same  Anesthesia: Gen.  Indications: Patient is a 72 year old male is several months status post a left below-knee amputation. He developed a nodule over the end of his below-knee amputation during recent falls with his prosthetic. He is on Coumadin.  Operative findings: Hematoma left below-knee amputation  Specimens: Culture left below-knee amputation  Operative details: After obtaining informed consent, the patient was taken to the operating room. The patient was placed in supine position on the operating room table. After induction of general anesthesia the left below-knee amputation was prepped and draped in usual sterile fashion. Next a longitudinal incision was made just adjacent to a fluctuant area over the tibia. The incision was carried through the subcutaneous tissues and a watery hematoma cavity was entered. This was approximately 4 cm in diameter. Cultures were taken of the hematoma. The wound was thoroughly irrigated with normal saline solution. The wound was then packed with iodoform gauze. The patient tolerated the procedure well and there were no complications. A dry sterile dressing was applied. The patient was awakened in the operating room and taken to the recovery room in stable condition. Instrument sponge and needle count was correct at the end of the case.  Fabienne Bruns, MD [facility] [place] Office: [phone] Pager: [phone]

## 2012-02-01 DIAGNOSIS — M25569 Pain in unspecified knee: Secondary | ICD-10-CM | POA: Diagnosis not present

## 2012-02-01 DIAGNOSIS — I10 Essential (primary) hypertension: Secondary | ICD-10-CM | POA: Diagnosis not present

## 2012-02-01 DIAGNOSIS — T8789 Other complications of amputation stump: Secondary | ICD-10-CM | POA: Diagnosis not present

## 2012-02-01 DIAGNOSIS — Z4801 Encounter for change or removal of surgical wound dressing: Secondary | ICD-10-CM | POA: Diagnosis not present

## 2012-02-01 DIAGNOSIS — Z794 Long term (current) use of insulin: Secondary | ICD-10-CM | POA: Diagnosis not present

## 2012-02-01 DIAGNOSIS — E119 Type 2 diabetes mellitus without complications: Secondary | ICD-10-CM | POA: Diagnosis not present

## 2012-02-01 NOTE — Progress Notes (Signed)
During post op follow up call Nurse called and spoke with patients wife and she informed Nurse that she called [facility] and left a voicemail today requesting that someone call her back about when home health was going to come to her home. Wife also informed Nurse that patient was having a lot of pain and that the prescribed pain mediation was not working. Nurse called [facility] and spoke with Okey Regal and informed her of this.

## 2012-02-02 ENCOUNTER — Encounter (HOSPITAL_COMMUNITY): Payer: Self-pay | Admitting: Vascular Surgery

## 2012-02-02 DIAGNOSIS — T8789 Other complications of amputation stump: Secondary | ICD-10-CM | POA: Diagnosis not present

## 2012-02-02 DIAGNOSIS — M25569 Pain in unspecified knee: Secondary | ICD-10-CM | POA: Diagnosis not present

## 2012-02-02 DIAGNOSIS — Z4801 Encounter for change or removal of surgical wound dressing: Secondary | ICD-10-CM | POA: Diagnosis not present

## 2012-02-02 DIAGNOSIS — I10 Essential (primary) hypertension: Secondary | ICD-10-CM | POA: Diagnosis not present

## 2012-02-02 DIAGNOSIS — Z794 Long term (current) use of insulin: Secondary | ICD-10-CM | POA: Diagnosis not present

## 2012-02-02 DIAGNOSIS — E119 Type 2 diabetes mellitus without complications: Secondary | ICD-10-CM | POA: Diagnosis not present

## 2012-02-02 LAB — WOUND CULTURE

## 2012-02-03 DIAGNOSIS — M25569 Pain in unspecified knee: Secondary | ICD-10-CM | POA: Diagnosis not present

## 2012-02-03 DIAGNOSIS — I10 Essential (primary) hypertension: Secondary | ICD-10-CM | POA: Diagnosis not present

## 2012-02-03 DIAGNOSIS — Z794 Long term (current) use of insulin: Secondary | ICD-10-CM | POA: Diagnosis not present

## 2012-02-03 DIAGNOSIS — E11311 Type 2 diabetes mellitus with unspecified diabetic retinopathy with macular edema: Secondary | ICD-10-CM | POA: Diagnosis not present

## 2012-02-03 DIAGNOSIS — E119 Type 2 diabetes mellitus without complications: Secondary | ICD-10-CM | POA: Diagnosis not present

## 2012-02-03 DIAGNOSIS — E11329 Type 2 diabetes mellitus with mild nonproliferative diabetic retinopathy without macular edema: Secondary | ICD-10-CM | POA: Diagnosis not present

## 2012-02-03 DIAGNOSIS — Z4801 Encounter for change or removal of surgical wound dressing: Secondary | ICD-10-CM | POA: Diagnosis not present

## 2012-02-03 DIAGNOSIS — T8789 Other complications of amputation stump: Secondary | ICD-10-CM | POA: Diagnosis not present

## 2012-02-03 DIAGNOSIS — H2700 Aphakia, unspecified eye: Secondary | ICD-10-CM | POA: Diagnosis not present

## 2012-02-04 DIAGNOSIS — I10 Essential (primary) hypertension: Secondary | ICD-10-CM | POA: Diagnosis not present

## 2012-02-04 DIAGNOSIS — M25569 Pain in unspecified knee: Secondary | ICD-10-CM | POA: Diagnosis not present

## 2012-02-04 DIAGNOSIS — Z794 Long term (current) use of insulin: Secondary | ICD-10-CM | POA: Diagnosis not present

## 2012-02-04 DIAGNOSIS — E119 Type 2 diabetes mellitus without complications: Secondary | ICD-10-CM | POA: Diagnosis not present

## 2012-02-04 DIAGNOSIS — Z4801 Encounter for change or removal of surgical wound dressing: Secondary | ICD-10-CM | POA: Diagnosis not present

## 2012-02-04 DIAGNOSIS — T8789 Other complications of amputation stump: Secondary | ICD-10-CM | POA: Diagnosis not present

## 2012-02-05 DIAGNOSIS — E119 Type 2 diabetes mellitus without complications: Secondary | ICD-10-CM | POA: Diagnosis not present

## 2012-02-05 DIAGNOSIS — T8789 Other complications of amputation stump: Secondary | ICD-10-CM | POA: Diagnosis not present

## 2012-02-05 DIAGNOSIS — Z4801 Encounter for change or removal of surgical wound dressing: Secondary | ICD-10-CM | POA: Diagnosis not present

## 2012-02-05 DIAGNOSIS — Z794 Long term (current) use of insulin: Secondary | ICD-10-CM | POA: Diagnosis not present

## 2012-02-05 DIAGNOSIS — M25569 Pain in unspecified knee: Secondary | ICD-10-CM | POA: Diagnosis not present

## 2012-02-05 DIAGNOSIS — I10 Essential (primary) hypertension: Secondary | ICD-10-CM | POA: Diagnosis not present

## 2012-02-07 DIAGNOSIS — Z794 Long term (current) use of insulin: Secondary | ICD-10-CM | POA: Diagnosis not present

## 2012-02-07 DIAGNOSIS — I10 Essential (primary) hypertension: Secondary | ICD-10-CM | POA: Diagnosis not present

## 2012-02-07 DIAGNOSIS — T8789 Other complications of amputation stump: Secondary | ICD-10-CM | POA: Diagnosis not present

## 2012-02-07 DIAGNOSIS — Z4801 Encounter for change or removal of surgical wound dressing: Secondary | ICD-10-CM | POA: Diagnosis not present

## 2012-02-07 DIAGNOSIS — M25569 Pain in unspecified knee: Secondary | ICD-10-CM | POA: Diagnosis not present

## 2012-02-07 DIAGNOSIS — E119 Type 2 diabetes mellitus without complications: Secondary | ICD-10-CM | POA: Diagnosis not present

## 2012-02-08 ENCOUNTER — Ambulatory Visit: Payer: Medicare Other | Admitting: Physical Therapy

## 2012-02-08 DIAGNOSIS — E1351 Other specified diabetes mellitus with diabetic peripheral angiopathy without gangrene: Secondary | ICD-10-CM | POA: Diagnosis not present

## 2012-02-08 DIAGNOSIS — M25569 Pain in unspecified knee: Secondary | ICD-10-CM | POA: Diagnosis not present

## 2012-02-08 DIAGNOSIS — I80299 Phlebitis and thrombophlebitis of other deep vessels of unspecified lower extremity: Secondary | ICD-10-CM | POA: Diagnosis not present

## 2012-02-08 DIAGNOSIS — T8789 Other complications of amputation stump: Secondary | ICD-10-CM | POA: Diagnosis not present

## 2012-02-08 DIAGNOSIS — I10 Essential (primary) hypertension: Secondary | ICD-10-CM | POA: Diagnosis not present

## 2012-02-08 DIAGNOSIS — Z4801 Encounter for change or removal of surgical wound dressing: Secondary | ICD-10-CM | POA: Diagnosis not present

## 2012-02-08 DIAGNOSIS — N189 Chronic kidney disease, unspecified: Secondary | ICD-10-CM | POA: Diagnosis not present

## 2012-02-08 DIAGNOSIS — I2510 Atherosclerotic heart disease of native coronary artery without angina pectoris: Secondary | ICD-10-CM | POA: Diagnosis not present

## 2012-02-08 DIAGNOSIS — Z794 Long term (current) use of insulin: Secondary | ICD-10-CM | POA: Diagnosis not present

## 2012-02-08 DIAGNOSIS — IMO0002 Reserved for concepts with insufficient information to code with codable children: Secondary | ICD-10-CM | POA: Diagnosis not present

## 2012-02-08 DIAGNOSIS — E119 Type 2 diabetes mellitus without complications: Secondary | ICD-10-CM | POA: Diagnosis not present

## 2012-02-09 DIAGNOSIS — T8789 Other complications of amputation stump: Secondary | ICD-10-CM | POA: Diagnosis not present

## 2012-02-09 DIAGNOSIS — Z4801 Encounter for change or removal of surgical wound dressing: Secondary | ICD-10-CM | POA: Diagnosis not present

## 2012-02-09 DIAGNOSIS — I10 Essential (primary) hypertension: Secondary | ICD-10-CM | POA: Diagnosis not present

## 2012-02-09 DIAGNOSIS — Z794 Long term (current) use of insulin: Secondary | ICD-10-CM | POA: Diagnosis not present

## 2012-02-09 DIAGNOSIS — E119 Type 2 diabetes mellitus without complications: Secondary | ICD-10-CM | POA: Diagnosis not present

## 2012-02-09 DIAGNOSIS — M25569 Pain in unspecified knee: Secondary | ICD-10-CM | POA: Diagnosis not present

## 2012-02-10 ENCOUNTER — Ambulatory Visit: Payer: Medicare Other | Admitting: Physical Therapy

## 2012-02-10 DIAGNOSIS — E119 Type 2 diabetes mellitus without complications: Secondary | ICD-10-CM | POA: Diagnosis not present

## 2012-02-10 DIAGNOSIS — N189 Chronic kidney disease, unspecified: Secondary | ICD-10-CM | POA: Diagnosis not present

## 2012-02-10 DIAGNOSIS — I10 Essential (primary) hypertension: Secondary | ICD-10-CM | POA: Diagnosis not present

## 2012-02-10 DIAGNOSIS — M25569 Pain in unspecified knee: Secondary | ICD-10-CM | POA: Diagnosis not present

## 2012-02-10 DIAGNOSIS — Z794 Long term (current) use of insulin: Secondary | ICD-10-CM | POA: Diagnosis not present

## 2012-02-10 DIAGNOSIS — Z4801 Encounter for change or removal of surgical wound dressing: Secondary | ICD-10-CM | POA: Diagnosis not present

## 2012-02-10 DIAGNOSIS — T8789 Other complications of amputation stump: Secondary | ICD-10-CM | POA: Diagnosis not present

## 2012-02-11 ENCOUNTER — Other Ambulatory Visit: Payer: Self-pay | Admitting: Physical Medicine & Rehabilitation

## 2012-02-11 DIAGNOSIS — T8789 Other complications of amputation stump: Secondary | ICD-10-CM | POA: Diagnosis not present

## 2012-02-11 DIAGNOSIS — M25569 Pain in unspecified knee: Secondary | ICD-10-CM | POA: Diagnosis not present

## 2012-02-11 DIAGNOSIS — I10 Essential (primary) hypertension: Secondary | ICD-10-CM | POA: Diagnosis not present

## 2012-02-11 DIAGNOSIS — Z4801 Encounter for change or removal of surgical wound dressing: Secondary | ICD-10-CM | POA: Diagnosis not present

## 2012-02-11 DIAGNOSIS — E119 Type 2 diabetes mellitus without complications: Secondary | ICD-10-CM | POA: Diagnosis not present

## 2012-02-11 DIAGNOSIS — Z794 Long term (current) use of insulin: Secondary | ICD-10-CM | POA: Diagnosis not present

## 2012-02-14 ENCOUNTER — Ambulatory Visit: Payer: Medicare Other | Admitting: Physical Therapy

## 2012-02-16 ENCOUNTER — Ambulatory Visit: Payer: Medicare Other | Admitting: Physical Therapy

## 2012-02-16 ENCOUNTER — Encounter: Payer: Self-pay | Admitting: Vascular Surgery

## 2012-02-17 ENCOUNTER — Encounter: Payer: Self-pay | Admitting: Vascular Surgery

## 2012-02-17 ENCOUNTER — Ambulatory Visit (INDEPENDENT_AMBULATORY_CARE_PROVIDER_SITE_OTHER): Payer: Medicare Other | Admitting: Vascular Surgery

## 2012-02-17 VITALS — BP 177/87 | HR 85 | Ht 68.0 in | Wt 213.0 lb

## 2012-02-17 DIAGNOSIS — I739 Peripheral vascular disease, unspecified: Secondary | ICD-10-CM

## 2012-02-17 NOTE — Progress Notes (Signed)
Patient is 72 year old male who is status post I&D of a hematoma of a left below-knee dictation. This most likely was secondary to coagulopathy. He returns today for further followup. He is been doing local wound care and the wound is almost healed at this point. He is no longer on Coumadin.  Physical exam: Filed Vitals:   02/17/12 1507  BP: 177/87  Pulse: 85  Height: 5\' 8"  (1.727 m)  Weight: 213 lb (96.616 kg)  SpO2: 100%   Longitudinal incision left tibial area almost healed at this point small amount of eschar  Assessment: Healing left below-knee amputation  Plan: The patient will continue to cover this with dry dressing and a shrinker. He will return to physical therapy and consideration for placement of a left leg prosthetic when the skin is completely epithelialized. He will followup with me on as-needed basis. He was given a prescription today for evaluation treatment by physical therapy.  Fabienne Bruns, MD [facility] [place] Office: [phone] Pager: [phone]

## 2012-02-21 ENCOUNTER — Ambulatory Visit: Payer: Medicare Other | Admitting: Physical Therapy

## 2012-02-21 DIAGNOSIS — E11359 Type 2 diabetes mellitus with proliferative diabetic retinopathy without macular edema: Secondary | ICD-10-CM | POA: Diagnosis not present

## 2012-02-21 DIAGNOSIS — H26499 Other secondary cataract, unspecified eye: Secondary | ICD-10-CM | POA: Diagnosis not present

## 2012-02-21 DIAGNOSIS — E11311 Type 2 diabetes mellitus with unspecified diabetic retinopathy with macular edema: Secondary | ICD-10-CM | POA: Diagnosis not present

## 2012-02-23 ENCOUNTER — Ambulatory Visit: Payer: Medicare Other | Admitting: Physical Therapy

## 2012-02-23 ENCOUNTER — Other Ambulatory Visit: Payer: Self-pay

## 2012-02-23 DIAGNOSIS — Z794 Long term (current) use of insulin: Secondary | ICD-10-CM | POA: Diagnosis not present

## 2012-02-23 DIAGNOSIS — E119 Type 2 diabetes mellitus without complications: Secondary | ICD-10-CM | POA: Diagnosis not present

## 2012-02-23 DIAGNOSIS — T8789 Other complications of amputation stump: Secondary | ICD-10-CM | POA: Diagnosis not present

## 2012-02-23 DIAGNOSIS — I10 Essential (primary) hypertension: Secondary | ICD-10-CM | POA: Diagnosis not present

## 2012-02-23 DIAGNOSIS — Z4801 Encounter for change or removal of surgical wound dressing: Secondary | ICD-10-CM | POA: Diagnosis not present

## 2012-02-23 DIAGNOSIS — M25569 Pain in unspecified knee: Secondary | ICD-10-CM | POA: Diagnosis not present

## 2012-02-28 ENCOUNTER — Ambulatory Visit: Payer: Medicare Other | Admitting: Physical Therapy

## 2012-02-28 DIAGNOSIS — E11311 Type 2 diabetes mellitus with unspecified diabetic retinopathy with macular edema: Secondary | ICD-10-CM | POA: Diagnosis not present

## 2012-03-01 ENCOUNTER — Ambulatory Visit: Payer: Medicare Other | Admitting: Physical Therapy

## 2012-03-27 ENCOUNTER — Encounter: Payer: Self-pay | Admitting: *Deleted

## 2012-03-29 ENCOUNTER — Other Ambulatory Visit: Payer: Self-pay | Admitting: *Deleted

## 2012-03-29 MED ORDER — PRAVASTATIN SODIUM 80 MG PO TABS: 40.0000 mg | ORAL_TABLET | Freq: Every morning | ORAL | Status: DC

## 2012-03-29 MED ORDER — AMLODIPINE BESYLATE 10 MG PO TABS: 10.0000 mg | ORAL_TABLET | Freq: Every morning | ORAL | Status: DC

## 2012-03-29 MED ORDER — INSULIN PEN NEEDLE 31G X 5 MM MISC: Status: DC

## 2012-03-29 MED ORDER — DOXAZOSIN MESYLATE 1 MG PO TABS: ORAL_TABLET | ORAL | Status: DC

## 2012-03-29 MED ORDER — LISINOPRIL 10 MG PO TABS: 10.0000 mg | ORAL_TABLET | Freq: Every day | ORAL | Status: DC

## 2012-03-31 ENCOUNTER — Other Ambulatory Visit: Payer: Self-pay | Admitting: *Deleted

## 2012-03-31 MED ORDER — AMLODIPINE BESYLATE 10 MG PO TABS: 10.0000 mg | ORAL_TABLET | Freq: Every morning | ORAL | Status: DC

## 2012-03-31 MED ORDER — LISINOPRIL 10 MG PO TABS: 10.0000 mg | ORAL_TABLET | Freq: Every day | ORAL | Status: DC

## 2012-04-10 ENCOUNTER — Other Ambulatory Visit: Payer: Self-pay | Admitting: Internal Medicine

## 2012-04-11 ENCOUNTER — Other Ambulatory Visit: Payer: Self-pay | Admitting: Geriatric Medicine

## 2012-04-11 ENCOUNTER — Other Ambulatory Visit: Payer: Self-pay | Admitting: *Deleted

## 2012-04-11 ENCOUNTER — Encounter: Payer: Self-pay | Admitting: Internal Medicine

## 2012-04-11 ENCOUNTER — Ambulatory Visit (INDEPENDENT_AMBULATORY_CARE_PROVIDER_SITE_OTHER): Payer: Medicare Other | Admitting: Internal Medicine

## 2012-04-11 VITALS — BP 140/82 | HR 76 | Temp 98.4°F | Resp 20 | Wt 212.0 lb

## 2012-04-11 DIAGNOSIS — E1142 Type 2 diabetes mellitus with diabetic polyneuropathy: Secondary | ICD-10-CM

## 2012-04-11 DIAGNOSIS — E1159 Type 2 diabetes mellitus with other circulatory complications: Secondary | ICD-10-CM | POA: Diagnosis not present

## 2012-04-11 DIAGNOSIS — E1140 Type 2 diabetes mellitus with diabetic neuropathy, unspecified: Secondary | ICD-10-CM

## 2012-04-11 DIAGNOSIS — E1149 Type 2 diabetes mellitus with other diabetic neurological complication: Secondary | ICD-10-CM

## 2012-04-11 DIAGNOSIS — S88119A Complete traumatic amputation at level between knee and ankle, unspecified lower leg, initial encounter: Secondary | ICD-10-CM | POA: Diagnosis not present

## 2012-04-11 DIAGNOSIS — I10 Essential (primary) hypertension: Secondary | ICD-10-CM

## 2012-04-11 DIAGNOSIS — Z89512 Acquired absence of left leg below knee: Secondary | ICD-10-CM

## 2012-04-11 MED ORDER — OXYCODONE HCL 5 MG PO TABS: 5.0000 mg | ORAL_TABLET | ORAL | Status: DC | PRN

## 2012-04-11 MED ORDER — OXYCODONE HCL 5 MG PO TABS: ORAL_TABLET | ORAL | Status: DC

## 2012-04-11 MED ORDER — OXYCODONE HCL 5 MG PO TABS: 5.0000 mg | ORAL_TABLET | Freq: Three times a day (TID) | ORAL | Status: DC | PRN

## 2012-04-11 MED ORDER — PREGABALIN 100 MG PO CAPS: 200.0000 mg | ORAL_CAPSULE | Freq: Two times a day (BID) | ORAL | Status: DC

## 2012-04-11 NOTE — Assessment & Plan Note (Signed)
bp under cntrol, continue crrent regimen. No chnages made this visit

## 2012-04-11 NOTE — Assessment & Plan Note (Signed)
Left BKA and has prosthetic. Using cane for now. Fall precautions. Will decrease oxycodone to q8h prn for now and reassess

## 2012-04-11 NOTE — Progress Notes (Signed)
Subjective:    Patient ID: Aaron Long, male    DOB: September 16, 1940, 72 y.o.   MRN: [id number]  HPI  72 y/o male pt here for follow up. He needs refills on his medication. He has his prosthetic leg in place and will start working with therapy in few weeks. In the interim he had I/D of his hematoma at the amputated site and wound is healing well. He is using is oxycodone 2-3 times a day. Blood pressure  Has been under control. Compliant with his medication cbg this am was 150s. cbg otherwise ranging between 110-250 lately. Pain in his legs not under control- in the left leg at amputation site   Review of Systems  Constitutional: Negative for fever, chills and fatigue.  HENT: Negative for congestion.   Eyes: Negative.   Respiratory: Negative for shortness of breath.   Cardiovascular: Negative for chest pain, palpitations and leg swelling.  Gastrointestinal: Positive for constipation. Negative for abdominal pain.  Endocrine: Positive for polyphagia.  Genitourinary: Negative.   Musculoskeletal: Positive for back pain and gait problem.  Neurological: Negative for dizziness.  Hematological: Negative for adenopathy.  Psychiatric/Behavioral: Negative for sleep disturbance and agitation.   Past Medical History  Diagnosis Date  . Prostate cancer 2010  . PAD (peripheral artery disease)   . Gangrene of toe     dry  . Neuromuscular disorder     diabetic neuropathy  . Hypertension     takes Amlodipine daily and Metoprolol bid  . Hyperlipidemia     takes Pravastatin daily  . Myocardial infarction 2005  . Peripheral neuropathy   . Constipation     takes Miralax daily  . Hemorrhoids   . Hx of colonic polyps   . History of kidney stones   . Diabetes mellitus     Novolog and Levemir daily  . Coronary artery disease   . BPH (benign prostatic hypertrophy)    Past Surgical History  Procedure Laterality Date  . Prostate surgery  2009    prostatectomy  . Knee cartilage surgery  at  age 53    left knee  . Cea      Right  . Coronary artery bypass graft  2005    x 4  . Carotid endarterectomy  2005    right  . Cataract surgery  2011    bilateral  . Colonoscopy    . Cardiac catheterization  05/28/11  . Amputation  06/14/2011    Procedure: AMPUTATION DIGIT;  Surgeon: Sherren Kerns, MD;  Location: [facility] OR;  Service: Vascular;  Laterality: Left;  Amputation Left fifth toe  . Amputation  06/16/2011    Procedure: AMPUTATION BELOW KNEE;  Surgeon: Sherren Kerns, MD;  Location: [facility] OR;  Service: Vascular;  Laterality: Left;  . I&d extremity  01/31/2012    Procedure: IRRIGATION AND DEBRIDEMENT EXTREMITY;  Surgeon: Sherren Kerns, MD;  Location: [facility] OR;  Service: Vascular;  Laterality: Left;  I & D Left BKA        Objective:   Physical Exam  Constitutional: He is oriented to person, place, and time. He appears well-developed and well-nourished. No distress.  HENT:  Head: Normocephalic and atraumatic.  Eyes: Conjunctivae are normal. Pupils are equal, round, and reactive to light.  Neck: Normal range of motion. Neck supple. No thyromegaly present.  Cardiovascular: Normal rate and regular rhythm.   Pulmonary/Chest: Effort normal and breath sounds normal.  Abdominal: Soft. Bowel sounds are normal.  Musculoskeletal: Normal range of  motion. He exhibits tenderness. He exhibits no edema.  Left BKA, dressing clean, shrinker in place  Lymphadenopathy:    He has no cervical adenopathy.  Neurological: He is alert and oriented to person, place, and time.  Skin: Skin is warm and dry. He is not diaphoretic. No pallor.  Psychiatric: He has a normal mood and affect. His behavior is normal.   Reviewed labs. See MAR   reviewed his medications Assessment & Plan:  HTN (hypertension) bp under cntrol, continue crrent regimen. No chnages made this visit  Diabetic neuropathy, type II diabetes mellitus Reviewed home cbg. Continue current levemir and premeal insulin. Recheck a1c prior to  next visit. To bring glucometer next visit. On acei. Follows with nephrology With neuropathic pain worsening will increase lyrica to 200 mg bid and reassess in 1 month  S/P BKA (below knee amputation) Left BKA and has prosthetic. Using cane for now. Fall precautions. Will decrease oxycodone to q8h prn for now and reassess

## 2012-04-11 NOTE — Assessment & Plan Note (Addendum)
Reviewed home cbg. Continue current levemir and premeal insulin. Recheck a1c prior to next visit. To bring glucometer next visit. On acei. Follows with nephrology With neuropathic pain worsening will increase lyrica to 200 mg bid and reassess in 1 month

## 2012-04-19 ENCOUNTER — Ambulatory Visit: Payer: Self-pay | Admitting: Internal Medicine

## 2012-04-24 ENCOUNTER — Emergency Department (HOSPITAL_COMMUNITY): Payer: Medicare Other

## 2012-04-24 ENCOUNTER — Encounter (HOSPITAL_COMMUNITY): Payer: Self-pay | Admitting: Nurse Practitioner

## 2012-04-24 ENCOUNTER — Inpatient Hospital Stay (HOSPITAL_COMMUNITY)
Admission: EM | Admit: 2012-04-24 | Discharge: 2012-04-26 | DRG: 641 | Disposition: A | Discharge status: Home / Self Care | Payer: Medicare Other | Attending: Internal Medicine | Admitting: Internal Medicine

## 2012-04-24 ENCOUNTER — Telehealth: Payer: Self-pay | Admitting: *Deleted

## 2012-04-24 DIAGNOSIS — E1169 Type 2 diabetes mellitus with other specified complication: Secondary | ICD-10-CM

## 2012-04-24 DIAGNOSIS — Z7982 Long term (current) use of aspirin: Secondary | ICD-10-CM

## 2012-04-24 DIAGNOSIS — I798 Other disorders of arteries, arterioles and capillaries in diseases classified elsewhere: Secondary | ICD-10-CM | POA: Diagnosis present

## 2012-04-24 DIAGNOSIS — L97509 Non-pressure chronic ulcer of other part of unspecified foot with unspecified severity: Secondary | ICD-10-CM

## 2012-04-24 DIAGNOSIS — C61 Malignant neoplasm of prostate: Secondary | ICD-10-CM | POA: Diagnosis not present

## 2012-04-24 DIAGNOSIS — S88119A Complete traumatic amputation at level between knee and ankle, unspecified lower leg, initial encounter: Secondary | ICD-10-CM | POA: Diagnosis not present

## 2012-04-24 DIAGNOSIS — E1159 Type 2 diabetes mellitus with other circulatory complications: Secondary | ICD-10-CM | POA: Diagnosis present

## 2012-04-24 DIAGNOSIS — I70269 Atherosclerosis of native arteries of extremities with gangrene, unspecified extremity: Secondary | ICD-10-CM

## 2012-04-24 DIAGNOSIS — L0291 Cutaneous abscess, unspecified: Secondary | ICD-10-CM

## 2012-04-24 DIAGNOSIS — Z79899 Other long term (current) drug therapy: Secondary | ICD-10-CM | POA: Diagnosis not present

## 2012-04-24 DIAGNOSIS — E875 Hyperkalemia: Secondary | ICD-10-CM | POA: Diagnosis not present

## 2012-04-24 DIAGNOSIS — I129 Hypertensive chronic kidney disease with stage 1 through stage 4 chronic kidney disease, or unspecified chronic kidney disease: Secondary | ICD-10-CM | POA: Diagnosis present

## 2012-04-24 DIAGNOSIS — Z01810 Encounter for preprocedural cardiovascular examination: Secondary | ICD-10-CM

## 2012-04-24 DIAGNOSIS — T44905A Adverse effect of unspecified drugs primarily affecting the autonomic nervous system, initial encounter: Secondary | ICD-10-CM | POA: Diagnosis present

## 2012-04-24 DIAGNOSIS — E1142 Type 2 diabetes mellitus with diabetic polyneuropathy: Secondary | ICD-10-CM | POA: Diagnosis present

## 2012-04-24 DIAGNOSIS — I252 Old myocardial infarction: Secondary | ICD-10-CM

## 2012-04-24 DIAGNOSIS — I872 Venous insufficiency (chronic) (peripheral): Secondary | ICD-10-CM | POA: Diagnosis present

## 2012-04-24 DIAGNOSIS — E11621 Type 2 diabetes mellitus with foot ulcer: Secondary | ICD-10-CM | POA: Diagnosis present

## 2012-04-24 DIAGNOSIS — E1140 Type 2 diabetes mellitus with diabetic neuropathy, unspecified: Secondary | ICD-10-CM

## 2012-04-24 DIAGNOSIS — N183 Chronic kidney disease, stage 3 unspecified: Secondary | ICD-10-CM | POA: Diagnosis present

## 2012-04-24 DIAGNOSIS — Z794 Long term (current) use of insulin: Secondary | ICD-10-CM | POA: Diagnosis not present

## 2012-04-24 DIAGNOSIS — E118 Type 2 diabetes mellitus with unspecified complications: Secondary | ICD-10-CM | POA: Diagnosis not present

## 2012-04-24 DIAGNOSIS — L089 Local infection of the skin and subcutaneous tissue, unspecified: Secondary | ICD-10-CM | POA: Diagnosis not present

## 2012-04-24 DIAGNOSIS — Z89511 Acquired absence of right leg below knee: Secondary | ICD-10-CM

## 2012-04-24 DIAGNOSIS — E1149 Type 2 diabetes mellitus with other diabetic neurological complication: Secondary | ICD-10-CM | POA: Diagnosis present

## 2012-04-24 DIAGNOSIS — Z860100 Personal history of colon polyps, unspecified: Secondary | ICD-10-CM

## 2012-04-24 DIAGNOSIS — I739 Peripheral vascular disease, unspecified: Secondary | ICD-10-CM

## 2012-04-24 DIAGNOSIS — I2510 Atherosclerotic heart disease of native coronary artery without angina pectoris: Secondary | ICD-10-CM | POA: Diagnosis present

## 2012-04-24 DIAGNOSIS — E785 Hyperlipidemia, unspecified: Secondary | ICD-10-CM | POA: Diagnosis present

## 2012-04-24 DIAGNOSIS — E119 Type 2 diabetes mellitus without complications: Secondary | ICD-10-CM

## 2012-04-24 DIAGNOSIS — I10 Essential (primary) hypertension: Secondary | ICD-10-CM

## 2012-04-24 DIAGNOSIS — Z8546 Personal history of malignant neoplasm of prostate: Secondary | ICD-10-CM | POA: Diagnosis not present

## 2012-04-24 DIAGNOSIS — Z951 Presence of aortocoronary bypass graft: Secondary | ICD-10-CM

## 2012-04-24 DIAGNOSIS — I70219 Atherosclerosis of native arteries of extremities with intermittent claudication, unspecified extremity: Secondary | ICD-10-CM

## 2012-04-24 DIAGNOSIS — L0390 Cellulitis, unspecified: Secondary | ICD-10-CM

## 2012-04-24 DIAGNOSIS — E1352 Other specified diabetes mellitus with diabetic peripheral angiopathy with gangrene: Secondary | ICD-10-CM

## 2012-04-24 DIAGNOSIS — N179 Acute kidney failure, unspecified: Secondary | ICD-10-CM

## 2012-04-24 LAB — POCT I-STAT, CHEM 8
BUN: 38 mg/dL — ABNORMAL HIGH (ref 6–23)
Calcium, Ion: 1.29 mmol/L (ref 1.13–1.30)
Creatinine, Ser: 2.3 mg/dL — ABNORMAL HIGH (ref 0.50–1.35)
Glucose, Bld: 234 mg/dL — ABNORMAL HIGH (ref 70–99)
TCO2: 28 mmol/L (ref 0–100)

## 2012-04-24 LAB — BASIC METABOLIC PANEL
BUN: 37 mg/dL — ABNORMAL HIGH (ref 6–23)
Calcium: 9.4 mg/dL (ref 8.4–10.5)
Chloride: 107 mEq/L (ref 96–112)
Creatinine, Ser: 2.52 mg/dL — ABNORMAL HIGH (ref 0.50–1.35)
Creatinine, Ser: 2.52 mg/dL — ABNORMAL HIGH (ref 0.50–1.35)
GFR calc Af Amer: 28 mL/min — ABNORMAL LOW (ref >90)
GFR calc Af Amer: 28 mL/min — ABNORMAL LOW (ref >90)
GFR calc Af Amer: 28 mL/min — ABNORMAL LOW (ref >90)
GFR calc non Af Amer: 24 mL/min — ABNORMAL LOW (ref >90)
GFR calc non Af Amer: 24 mL/min — ABNORMAL LOW (ref >90)
GFR calc non Af Amer: 24 mL/min — ABNORMAL LOW (ref >90)
Glucose, Bld: 283 mg/dL — ABNORMAL HIGH (ref 70–99)
Potassium: 4.5 mEq/L (ref 3.5–5.1)
Sodium: 144 mEq/L (ref 135–145)

## 2012-04-24 LAB — CBC
HCT: 36.4 % — ABNORMAL LOW (ref 39.0–52.0)
Hemoglobin: 12.2 g/dL — ABNORMAL LOW (ref 13.0–17.0)
Hemoglobin: 12.3 g/dL — ABNORMAL LOW (ref 13.0–17.0)
MCH: 29.9 pg (ref 26.0–34.0)
MCHC: 33.5 g/dL (ref 30.0–36.0)
MCV: 89.2 fL (ref 78.0–100.0)
RBC: 4.11 MIL/uL — ABNORMAL LOW (ref 4.22–5.81)
RDW: 13.8 % (ref 11.5–15.5)

## 2012-04-24 LAB — GLUCOSE, CAPILLARY: Glucose-Capillary: 233 mg/dL — ABNORMAL HIGH (ref 70–99)

## 2012-04-24 LAB — CREATININE, SERUM
Creatinine, Ser: 2.53 mg/dL — ABNORMAL HIGH (ref 0.50–1.35)
GFR calc non Af Amer: 24 mL/min — ABNORMAL LOW (ref >90)

## 2012-04-24 MED ORDER — INSULIN ASPART 100 UNIT/ML ~~LOC~~ SOLN
0.0000 [IU] | Freq: Three times a day (TID) | SUBCUTANEOUS | Status: DC
  Administered 2012-04-25 (×2): 3 [IU] via SUBCUTANEOUS

## 2012-04-24 MED ORDER — OXYCODONE HCL 5 MG PO TABS: 5.0000 mg | ORAL_TABLET | ORAL | Status: DC | PRN

## 2012-04-24 MED ORDER — INSULIN ASPART 100 UNIT/ML ~~LOC~~ SOLN
4.0000 [IU] | Freq: Three times a day (TID) | SUBCUTANEOUS | Status: DC
  Administered 2012-04-25 (×2): 4 [IU] via SUBCUTANEOUS

## 2012-04-24 MED ORDER — SENNOSIDES-DOCUSATE SODIUM 8.6-50 MG PO TABS
1.0000 | ORAL_TABLET | Freq: Every evening | ORAL | Status: DC | PRN
  Filled 2012-04-24: qty 1

## 2012-04-24 MED ORDER — INSULIN DETEMIR 100 UNIT/ML ~~LOC~~ SOLN
18.0000 [IU] | Freq: Every day | SUBCUTANEOUS | Status: DC
  Administered 2012-04-24 – 2012-04-25 (×2): 18 [IU] via SUBCUTANEOUS
  Filled 2012-04-24 (×3): qty 0.18

## 2012-04-24 MED ORDER — DOXAZOSIN MESYLATE 1 MG PO TABS
1.0000 mg | ORAL_TABLET | Freq: Every day | ORAL | Status: DC
  Administered 2012-04-24 – 2012-04-25 (×2): 1 mg via ORAL
  Filled 2012-04-24 (×4): qty 1

## 2012-04-24 MED ORDER — ACETAMINOPHEN 325 MG PO TABS: 650.0000 mg | ORAL_TABLET | Freq: Four times a day (QID) | ORAL | Status: DC | PRN

## 2012-04-24 MED ORDER — LABETALOL HCL 5 MG/ML IV SOLN
5.0000 mg | INTRAVENOUS | Status: DC | PRN
  Administered 2012-04-24 – 2012-04-25 (×2): 5 mg via INTRAVENOUS
  Filled 2012-04-24 (×2): qty 4

## 2012-04-24 MED ORDER — SODIUM POLYSTYRENE SULFONATE 15 GM/60ML PO SUSP
30.0000 g | Freq: Once | ORAL | Status: AC
  Administered 2012-04-24: 30 g via ORAL
  Filled 2012-04-24: qty 120

## 2012-04-24 MED ORDER — SODIUM POLYSTYRENE SULFONATE 15 GM/60ML PO SUSP: 30.0000 g | Freq: Once | ORAL | Status: DC

## 2012-04-24 MED ORDER — ASPIRIN EC 81 MG PO TBEC
81.0000 mg | DELAYED_RELEASE_TABLET | Freq: Every morning | ORAL | Status: DC
  Administered 2012-04-25: 81 mg via ORAL
  Filled 2012-04-24 (×2): qty 1

## 2012-04-24 MED ORDER — DEXTROSE 50 % IV SOLN
1.0000 | Freq: Once | INTRAVENOUS | Status: AC
  Administered 2012-04-24: 50 mL via INTRAVENOUS
  Filled 2012-04-24: qty 50

## 2012-04-24 MED ORDER — SIMVASTATIN 5 MG PO TABS
5.0000 mg | ORAL_TABLET | Freq: Every day | ORAL | Status: DC
  Administered 2012-04-25: 5 mg via ORAL
  Filled 2012-04-24 (×2): qty 1

## 2012-04-24 MED ORDER — SODIUM CHLORIDE 0.9 % IJ SOLN
3.0000 mL | Freq: Two times a day (BID) | INTRAMUSCULAR | Status: DC
  Administered 2012-04-24 – 2012-04-25 (×2): 3 mL via INTRAVENOUS

## 2012-04-24 MED ORDER — ONDANSETRON HCL 8 MG PO TABS
4.0000 mg | ORAL_TABLET | Freq: Four times a day (QID) | ORAL | Status: DC | PRN
  Filled 2012-04-24: qty 0.5

## 2012-04-24 MED ORDER — SODIUM CHLORIDE 0.9 % IV SOLN
INTRAVENOUS | Status: DC
  Administered 2012-04-24: 1000 mL via INTRAVENOUS

## 2012-04-24 MED ORDER — ONDANSETRON HCL 4 MG/2ML IJ SOLN: 4.0000 mg | Freq: Four times a day (QID) | INTRAMUSCULAR | Status: DC | PRN

## 2012-04-24 MED ORDER — PREGABALIN 50 MG PO CAPS
200.0000 mg | ORAL_CAPSULE | Freq: Two times a day (BID) | ORAL | Status: DC
Start: 2012-04-24 — End: 2012-04-26
  Administered 2012-04-24 – 2012-04-25 (×3): 200 mg via ORAL
  Filled 2012-04-24 (×3): qty 4

## 2012-04-24 MED ORDER — ACETAMINOPHEN 650 MG RE SUPP: 650.0000 mg | Freq: Four times a day (QID) | RECTAL | Status: DC | PRN

## 2012-04-24 MED ORDER — ACETAMINOPHEN 500 MG PO TABS
1000.0000 mg | ORAL_TABLET | Freq: Once | ORAL | Status: AC
  Administered 2012-04-24: 1000 mg via ORAL
  Filled 2012-04-24: qty 2

## 2012-04-24 MED ORDER — INSULIN ASPART 100 UNIT/ML ~~LOC~~ SOLN
10.0000 [IU] | Freq: Once | SUBCUTANEOUS | Status: AC
  Administered 2012-04-24: 10 [IU] via INTRAVENOUS
  Filled 2012-04-24: qty 1

## 2012-04-24 MED ORDER — SODIUM CHLORIDE 0.9 % IV SOLN
1.0000 g | Freq: Once | INTRAVENOUS | Status: AC
  Administered 2012-04-24: 1 g via INTRAVENOUS
  Filled 2012-04-24: qty 10

## 2012-04-24 MED ORDER — SODIUM BICARBONATE 8.4 % IV SOLN
50.0000 meq | Freq: Once | INTRAVENOUS | Status: AC
  Administered 2012-04-24: 50 meq via INTRAVENOUS
  Filled 2012-04-24: qty 50

## 2012-04-24 MED ORDER — ENOXAPARIN SODIUM 30 MG/0.3ML ~~LOC~~ SOLN
30.0000 mg | SUBCUTANEOUS | Status: DC
  Administered 2012-04-24 – 2012-04-25 (×2): 30 mg via SUBCUTANEOUS
  Filled 2012-04-24 (×3): qty 0.3

## 2012-04-24 MED ORDER — AMLODIPINE BESYLATE 10 MG PO TABS
10.0000 mg | ORAL_TABLET | Freq: Every morning | ORAL | Status: DC
  Administered 2012-04-25: 10 mg via ORAL
  Filled 2012-04-24 (×2): qty 1

## 2012-04-24 MED ORDER — DOXYCYCLINE HYCLATE 100 MG PO TABS
100.0000 mg | ORAL_TABLET | Freq: Two times a day (BID) | ORAL | Status: DC
  Administered 2012-04-24 – 2012-04-25 (×2): 100 mg via ORAL
  Filled 2012-04-24 (×5): qty 1

## 2012-04-24 NOTE — Progress Notes (Signed)
Upon assessment, the nurse observes the patient's BP of 211/69. The nurse pages Donnamarie Poag (NP). Donnamarie Poag calls the nurse and instructs the nurse to administer Labetalol 5mg  IV to the patient to decrease BP. The nurse performed as was instructed. [name] Pier

## 2012-04-24 NOTE — ED Provider Notes (Signed)
  Physical Exam  BP 165/72  Pulse 69  Temp(Src) 98.7 F (37.1 C) (Oral)  Resp 15  SpO2 98%  Physical Exam  ED Course  Procedures  Basic metabolic panel (Edited Result - FINAL) Abnormal Component (Lab Inquiry)      Result Time NA K CL CO2 GLUCOSE    04/24/12 21:18:53 144 4.5 DELTA CHECK NOTED NO VISIBLE HEMOLYSIS 107 26 70         Result Time BUN Creatinine, Ser CALCIUM GFR calc non Af Amer GFR calc Af Amer    04/24/12 21:18:53 35 (H) 2.50 (H) 10.1 24 (L) 28 (L)        The eGFR has been calculated using the CKD EPI equation. This calculation has not been validated in all clinical situations. eGFR's persistently <90 mL/min signify possible Chronic Kidney Disease.       I-STAT, chem 8 (Final result) Abnormal Component (Lab Inquiry)      Result Time NA K CL BUN Creatinine, Ser    04/24/12 18:26:57 141 6.1 (H) 107 38 (H) 2.30 (H)         Result Time GLUCOSE CA ION TCO2 HGB HCT    04/24/12 18:26:57 234 (H) 1.29 28 12.9 (L) 38.0 (L)         Result Time Comment    04/24/12 18:26:57 NOTIFIED PHYSICIAN              Basic metabolic panel (Final result) Abnormal Component (Lab Inquiry)      Result Time NA K CL CO2 GLUCOSE    04/24/12 16:05:30 140 6.2 (H) 105 26 283 (H)         Result Time BUN Creatinine, Ser CALCIUM GFR calc non Af Amer GFR calc Af Amer    04/24/12 16:05:30 37 (H) 2.52 (H) 9.4 24 (L) 28 (L)        The eGFR has been calculated using the CKD EPI equation. This calculation has not been validated in all clinical situations. eGFR's persistently <90 mL/min signify possible Chronic Kidney Disease.       CBC (Final result) Abnormal Component (Lab Inquiry)      Result Time WBC RBC HGB HCT MCV    04/24/12 15:26:59 7.8 4.08 (L) 12.2 (L) 36.4 (L) 89.2         Result Time MCH MCHC RDW PLT    04/24/12 15:26:59 29.9 33.5 13.8 212    MDM: See HPI from [facility]. Pt is a 72 yo M w/hx of IDDM and BKA who presented for drainage at site of right great toe; found to have acute  renal failure with hyperkalemia. Slight peaking of T waves in lateral leads; however, QRS intervals normal. Pt making urine. Insulin/glucose and kayexalate administered with resolution of hyperkalemia. Pt admitted to Hospitalist service.        Clemetine Marker, MD 04/24/12 [id number]

## 2012-04-24 NOTE — Telephone Encounter (Signed)
Mrs. Sprinkle called and stated that her husband is in the ED due to on his good leg and foot toe turned black and oozing. I told her to tell the ED physician that we are on Epic so he can see what we have been doing

## 2012-04-24 NOTE — H&P (Signed)
[facility]          History and Physical    PCP:   Oneal Grout, MD   Chief Complaint:  Drainage on sock around right great toe  HPI: Patient is a 72 year old black man with a history of diabetes, peripheral vascular disease and stage III chronic kidney disease. He has had a prior left BKA and is currently wearing a metal prosthesis that he has only been wearing for a few weeks. As he is getting used to his prosthesis, he is dragging his right foot with his gait. He has developed a small wound over his right big toe where his nail was digging into the skin. EDP trimmed his nails and was going to send him home on oral antibiotics, however blood work showed hyperkalemia with a potassium of 6.2 and hence we have been asked to admit him for further evaluation and management. He denies fevers, chills, chest pain, shortness of breath or any other symptoms.  Allergies:  No Known Allergies    Past Medical History  Diagnosis Date  . Prostate cancer 2010  . PAD (peripheral artery disease)   . Gangrene of toe     dry  . Neuromuscular disorder     diabetic neuropathy  . Hypertension     takes Amlodipine daily and Metoprolol bid  . Hyperlipidemia     takes Pravastatin daily  . Myocardial infarction 2005  . Peripheral neuropathy   . Constipation     takes Miralax daily  . Hemorrhoids   . Hx of colonic polyps   . History of kidney stones   . Diabetes mellitus     Novolog and Levemir daily  . Coronary artery disease   . BPH (benign prostatic hypertrophy)     Past Surgical History  Procedure Laterality Date  . Prostate surgery  2009    prostatectomy  . Knee cartilage surgery  at age 65    left knee  . Cea      Right  . Coronary artery bypass graft  2005    x 4  . Carotid endarterectomy  2005    right  . Cataract surgery  2011    bilateral  . Colonoscopy    . Cardiac catheterization  05/28/11  . Amputation  06/14/2011    Procedure: AMPUTATION DIGIT;   Surgeon: Sherren Kerns, MD;  Location: [facility] OR;  Service: Vascular;  Laterality: Left;  Amputation Left fifth toe  . Amputation  06/16/2011    Procedure: AMPUTATION BELOW KNEE;  Surgeon: Sherren Kerns, MD;  Location: [facility] OR;  Service: Vascular;  Laterality: Left;  . I&d extremity  01/31/2012    Procedure: IRRIGATION AND DEBRIDEMENT EXTREMITY;  Surgeon: Sherren Kerns, MD;  Location: [facility] OR;  Service: Vascular;  Laterality: Left;  I & D Left BKA     Prior to Admission medications   Medication Sig Start Date End Date Taking? Authorizing Provider  amLODipine (NORVASC) 10 MG tablet Take 1 tablet (10 mg total) by mouth every morning. 03/31/12  Yes Mahima Pandey, MD  aspirin EC 81 MG tablet Take 81 mg by mouth every morning.    Yes Historical Provider, MD  doxazosin (CARDURA) 1 MG tablet Take 1/2 tablet by mouth at bedtime for blood pressure 03/29/12  Yes Claudie Revering, NP  insulin aspart (NOVOLOG FLEXPEN) 100 UNIT/ML injection Inject 5 Units into the skin 3 (three) times daily before meals.   Yes Historical Provider, MD  insulin detemir (LEVEMIR FLEXPEN)  100 UNIT/ML injection Inject 18 Units into the skin at bedtime.   Yes Historical Provider, MD  lisinopril (PRINIVIL,ZESTRIL) 10 MG tablet Take 10 mg by mouth daily.   Yes Historical Provider, MD  oxyCODONE (OXY IR/ROXICODONE) 5 MG immediate release tablet Take 1 tablet (5 mg total) by mouth every 8 (eight) hours as needed for pain. Take one tablet every 4 hours as needed for pain 04/11/12  Yes Mahima Pandey, MD  pravastatin (PRAVACHOL) 80 MG tablet Take 0.5 tablets (40 mg total) by mouth every morning. 03/29/12  Yes Claudie Revering, NP  pregabalin (LYRICA) 100 MG capsule Take 2 capsules (200 mg total) by mouth 2 (two) times daily. 04/11/12 04/11/13 Yes Mahima Pandey, MD    Social History:  reports that he quit smoking about 21 years ago. His smoking use included Cigarettes. He has a 40 pack-year smoking history. He has never used smokeless tobacco. He  reports that  drinks alcohol. He reports that he does not use illicit drugs.  Family History  Problem Relation Age of Onset  . Coronary artery disease Neg Hx   . Anesthesia problems Neg Hx   . Hypotension Neg Hx   . Malignant hyperthermia Neg Hx   . Pseudochol deficiency Neg Hx   . Hyperlipidemia Mother   . Hypertension Mother   . Cancer Mother   . Hyperlipidemia Father   . Hypertension Father   . Kidney disease Father   . Heart disease Sister   . Alzheimer's disease Sister   . Cancer Brother   . Other Sister     Review of Systems:  Constitutional: Denies fever, chills, diaphoresis, appetite change and fatigue.  HEENT: Denies photophobia, eye pain, redness, hearing loss, ear pain, congestion, sore throat, rhinorrhea, sneezing, mouth sores, trouble swallowing, neck pain, neck stiffness and tinnitus.   Respiratory: Denies SOB, DOE, cough, chest tightness,  and wheezing.   Cardiovascular: Denies chest pain, palpitations and leg swelling.  Gastrointestinal: Denies nausea, vomiting, abdominal pain, diarrhea, constipation, blood in stool and abdominal distention.  Genitourinary: Denies dysuria, urgency, frequency, hematuria, flank pain and difficulty urinating.  Musculoskeletal: Denies myalgias, back pain, joint swelling, arthralgias and gait problem.  Skin: Denies pallor, rash and wound.  Neurological: Denies dizziness, seizures, syncope, weakness, light-headedness, numbness and headaches.  Hematological: Denies adenopathy. Easy bruising, personal or family bleeding history  Psychiatric/Behavioral: Denies suicidal ideation, mood changes, confusion, nervousness, sleep disturbance and agitation   Physical Exam: Blood pressure 165/72, pulse 69, temperature 98.7 F (37.1 C), temperature source Oral, resp. rate 15, SpO2 98.00%. General: Alert, awake, oriented x3, in no acute distress. HEENT: Normocephalic, atraumatic, pupils equal round and reactive to light, intact extraocular  movements, moist mucous membranes, wears corrective lenses. Neck: Supple, no JVD, no lymphadenopathy, no bruits, no goiter. Cardiovascular: Regular rate and rhythm, no murmurs, rubs or gallops. Lungs: Clear to auscultation bilaterally. Abdomen: Soft, nontender, nondistended, positive bowel sounds, no masses or organomegaly noted. Extremities: On the left he has a below knee amputation with a metal prosthesis, on the right there is about 2+ pitting edema up to his mid shin bilaterally, there is an area of bruising over his right big toe, skin does not appear to be open. Neurologic: Grossly intact and nonfocal.  Labs on Admission:  Results for orders placed during the hospital encounter of 04/24/12 (from the past 48 hour(s))  CBC     Status: Abnormal   Collection Time    04/24/12  2:57 PM      Result Value  Range   WBC 7.8  4.0 - 10.5 K/uL   RBC 4.08 (*) 4.22 - 5.81 MIL/uL   Hemoglobin 12.2 (*) 13.0 - 17.0 g/dL   HCT 21.3 (*) 08.6 - 57.8 %   MCV 89.2  78.0 - 100.0 fL   MCH 29.9  26.0 - 34.0 pg   MCHC 33.5  30.0 - 36.0 g/dL   RDW 46.9  62.9 - 52.8 %   Platelets 212  150 - 400 K/uL  BASIC METABOLIC PANEL     Status: Abnormal   Collection Time    04/24/12  2:57 PM      Result Value Range   Sodium 140  135 - 145 mEq/L   Potassium 6.2 (*) 3.5 - 5.1 mEq/L   Chloride 105  96 - 112 mEq/L   CO2 26  19 - 32 mEq/L   Glucose, Bld 283 (*) 70 - 99 mg/dL   BUN 37 (*) 6 - 23 mg/dL   Creatinine, Ser 4.13 (*) 0.50 - 1.35 mg/dL   Calcium 9.4  8.4 - 24.4 mg/dL   GFR calc non Af Amer 24 (*) >90 mL/min   GFR calc Af Amer 28 (*) >90 mL/min   Comment:            The eGFR has been calculated     using the CKD EPI equation.     This calculation has not been     validated in all clinical     situations.     eGFR's persistently     <90 mL/min signify     possible Chronic Kidney Disease.  GLUCOSE, CAPILLARY     Status: Abnormal   Collection Time    04/24/12  3:09 PM      Result Value Range    Glucose-Capillary 273 (*) 70 - 99 mg/dL   Comment 1 Notify RN     Comment 2 Documented in Chart    POCT I-STAT, CHEM 8     Status: Abnormal   Collection Time    04/24/12  4:46 PM      Result Value Range   Sodium 141  135 - 145 mEq/L   Potassium 6.1 (*) 3.5 - 5.1 mEq/L   Chloride 107  96 - 112 mEq/L   BUN 38 (*) 6 - 23 mg/dL   Creatinine, Ser 0.10 (*) 0.50 - 1.35 mg/dL   Glucose, Bld 272 (*) 70 - 99 mg/dL   Calcium, Ion 5.36  6.44 - 1.30 mmol/L   TCO2 28  0 - 100 mmol/L   Hemoglobin 12.9 (*) 13.0 - 17.0 g/dL   HCT 03.4 (*) 74.2 - 59.5 %   Comment NOTIFIED PHYSICIAN      Radiological Exams on Admission: Dg Foot Complete Right  04/24/2012  *RADIOLOGY REPORT*  Clinical Data: Right great toe infection, sore, bleeding, pain, history diabetes  RIGHT FOOT COMPLETE - 3+ VIEW  Comparison: None  Findings: Osseous demineralization. Joint spaces preserved. No acute fracture, dislocation, or bone destruction. Specifically no bone destruction seen at great toe to suggest osteomyelitis. Scattered small vessel vascular calcifications consistent with history of diabetes. No radiopaque foreign body or soft tissue gas identified.  IMPRESSION: No acute abnormalities.   Original Report Authenticated By: Ulyses Southward, M.D.     Assessment/Plan Principal Problem:   Hyperkalemia Active Problems:   Diabetic foot ulcer associated with type 2 diabetes mellitus   CKD (chronic kidney disease) stage 3, GFR 30-59 ml/min   S/P BKA (below knee amputation)  Hyperkalemia -Has already been given Kayexalate, insulin, D50, calcium gluconate in the emergency department. -Will recheck be met at 10 PM and again tomorrow morning. -EKG without acute changes. -We'll discontinue lisinopril indefinitely at this point, it appears that as far back as last year he has always been hyperkalemic.  Early diabetic foot ulcer surrounding right great toe -Will place on oral doxycycline 100 mg twice daily for 14 days. -We'll also  get a PT evaluation as I suspect that this ulcer has come around by his adaptation process to his new prosthesis. He will likely benefit from some outpatient physical therapy as well.  Insulin-dependent diabetes mellitus -Continue home dose of Lantus. -Sliding-scale insulin while in the hospital. -Check hemoglobin A1c.  Chronic kidney disease stage III -Baseline creatinine seems to be between 1.8 and 2.5. -Is currently at baseline.   Time Spent on Admission: 75 minutes  HERNANDEZ ACOSTA,ESTELA [facility] Pager: [phone] 04/24/2012, 6:57 PM

## 2012-04-24 NOTE — ED Notes (Signed)
Pt is IDDM with left BKA. Noticed drainage on sock of right great toe with odor. Skin surrounding toe nail is cracked and has yellow discoloration.

## 2012-04-24 NOTE — ED Provider Notes (Signed)
History    This chart was scribed for non-physician practitioner Junious Silk, PA-C working with Gwyneth Sprout, MD by Gerlean Ren, ED Scribe. This patient was seen in room TR10C/TR10C and the patient's care was started at 3:27 PM.   CSN: [id number]  Arrival date & time 04/24/12  1318   None     Chief Complaint  Patient presents with  . Foot Problem    The history is provided by the patient. No language interpreter was used.  Aaron Long is a 72 y.o. male who presents to the Emergency Department complaining of 2 days of drainage from right great toe at edge of the nail.  No associated trauma to the area.  Pt reports mild associated pain but is most concerned with drainage.  Drainage became malodorous last night.  Pt has h/o DM but has no prior similar infections in toes.  No regular podiatrist.  Pt denies fevers, chills, nausea, emesis, abdominal pain, chest pain, dyspnea.  Past Medical History  Diagnosis Date  . Prostate cancer 2010  . PAD (peripheral artery disease)   . Gangrene of toe     dry  . Neuromuscular disorder     diabetic neuropathy  . Hypertension     takes Amlodipine daily and Metoprolol bid  . Hyperlipidemia     takes Pravastatin daily  . Myocardial infarction 2005  . Peripheral neuropathy   . Constipation     takes Miralax daily  . Hemorrhoids   . Hx of colonic polyps   . History of kidney stones   . Diabetes mellitus     Novolog and Levemir daily  . Coronary artery disease   . BPH (benign prostatic hypertrophy)     Past Surgical History  Procedure Laterality Date  . Prostate surgery  2009    prostatectomy  . Knee cartilage surgery  at age 10    left knee  . Cea      Right  . Coronary artery bypass graft  2005    x 4  . Carotid endarterectomy  2005    right  . Cataract surgery  2011    bilateral  . Colonoscopy    . Cardiac catheterization  05/28/11  . Amputation  06/14/2011    Procedure: AMPUTATION DIGIT;  Surgeon: Sherren Kerns, MD;   Location: [facility] OR;  Service: Vascular;  Laterality: Left;  Amputation Left fifth toe  . Amputation  06/16/2011    Procedure: AMPUTATION BELOW KNEE;  Surgeon: Sherren Kerns, MD;  Location: [facility] OR;  Service: Vascular;  Laterality: Left;  . I&d extremity  01/31/2012    Procedure: IRRIGATION AND DEBRIDEMENT EXTREMITY;  Surgeon: Sherren Kerns, MD;  Location: [facility] OR;  Service: Vascular;  Laterality: Left;  I & D Left BKA     Family History  Problem Relation Age of Onset  . Coronary artery disease Neg Hx   . Anesthesia problems Neg Hx   . Hypotension Neg Hx   . Malignant hyperthermia Neg Hx   . Pseudochol deficiency Neg Hx   . Hyperlipidemia Mother   . Hypertension Mother   . Cancer Mother   . Hyperlipidemia Father   . Hypertension Father   . Kidney disease Father   . Heart disease Sister   . Alzheimer's disease Sister   . Cancer Brother   . Other Sister     History  Substance Use Topics  . Smoking status: Former Smoker -- 1.00 packs/day for 40 years  Types: Cigarettes    Quit date: 04/28/1991  . Smokeless tobacco: Never Used     Comment: occ alcohol  . Alcohol Use: Yes     Comment: occasional      Review of Systems  Constitutional: Negative for fever and chills.  Respiratory: Negative for shortness of breath.   Cardiovascular: Negative for chest pain.  Gastrointestinal: Negative for nausea, vomiting and abdominal pain.  Skin:       Positive skin infection, right great toe  All other systems reviewed and are negative.    Allergies  Review of patient's allergies indicates no known allergies.  Home Medications   Current Outpatient Rx  Name  Route  Sig  Dispense  Refill  . amLODipine (NORVASC) 10 MG tablet   Oral   Take 1 tablet (10 mg total) by mouth every morning.   30 tablet   5   . aspirin EC 81 MG tablet   Oral   Take 81 mg by mouth every morning.          [name] doxazosin (CARDURA) 1 MG tablet      Take 1/2 tablet by mouth at bedtime for blood pressure    15 tablet   5   . insulin aspart (NOVOLOG FLEXPEN) 100 UNIT/ML injection   Subcutaneous   Inject 5 Units into the skin 3 (three) times daily before meals.         . insulin detemir (LEVEMIR FLEXPEN) 100 UNIT/ML injection   Subcutaneous   Inject 18 Units into the skin at bedtime.         . Insulin Pen Needle (B-D UF III MINI PEN NEEDLES) 31G X 5 MM MISC      Inject insulin three times daily for diabetes. Dx 250.02, 401.9   100 each   11   . lisinopril (PRINIVIL,ZESTRIL) 10 MG tablet   Oral   Take 10 mg by mouth daily.         . ONE TOUCH ULTRA TEST test strip      USE TO TEST BLOOD SUGAR TWICE A DAY BEFORE MEALS ALTERNATING BETWEEN MORNING AND EVENING MEAL.   100 each   12   . oxyCODONE (OXY IR/ROXICODONE) 5 MG immediate release tablet   Oral   Take 1 tablet (5 mg total) by mouth every 8 (eight) hours as needed for pain. Take one tablet every 4 hours as needed for pain   180 tablet   0   . pravastatin (PRAVACHOL) 80 MG tablet   Oral   Take 0.5 tablets (40 mg total) by mouth every morning.   30 tablet   5   . pregabalin (LYRICA) 100 MG capsule   Oral   Take 2 capsules (200 mg total) by mouth 2 (two) times daily.   60 capsule   3     BP 178/81  Pulse 81  Temp(Src) 98.7 F (37.1 C) (Oral)  Resp 20  SpO2 98%  Physical Exam  Nursing note and vitals reviewed. Constitutional: He is oriented to person, place, and time. He appears well-developed and well-nourished. No distress.  HENT:  Head: Normocephalic and atraumatic.  Right Ear: External ear normal.  Left Ear: External ear normal.  Nose: Nose normal.  Eyes: Conjunctivae are normal.  Neck: Normal range of motion. No tracheal deviation present.  Cardiovascular: Normal rate, regular rhythm and normal heart sounds.   Pulmonary/Chest: Effort normal and breath sounds normal. No stridor.  Abdominal: Soft. He exhibits no distension. There is no  tenderness.  Musculoskeletal: Normal range of motion.  Left  BKA  Neurological: He is alert and oriented to person, place, and time.  Skin: Skin is warm and dry. He is not diaphoretic.  Erythematous, swollen, no streaking right great toe   Psychiatric: He has a normal mood and affect. His behavior is normal.    ED Course  Procedures (including critical care time) DIAGNOSTIC STUDIES: Oxygen Saturation is 98% on room air, normal by my interpretation.    COORDINATION OF CARE: 3:29 PM- Discussed blood work, right foot XR, and pain control at this time.  Pt understands and agrees with plan.    Results for orders placed during the hospital encounter of 04/24/12  CBC      Result Value Range   WBC 7.8  4.0 - 10.5 K/uL   RBC 4.08 (*) 4.22 - 5.81 MIL/uL   Hemoglobin 12.2 (*) 13.0 - 17.0 g/dL   HCT 98.1 (*) 19.1 - 47.8 %   MCV 89.2  78.0 - 100.0 fL   MCH 29.9  26.0 - 34.0 pg   MCHC 33.5  30.0 - 36.0 g/dL   RDW 29.5  62.1 - 30.8 %   Platelets 212  150 - 400 K/uL  BASIC METABOLIC PANEL      Result Value Range   Sodium 140  135 - 145 mEq/L   Potassium 6.2 (*) 3.5 - 5.1 mEq/L   Chloride 105  96 - 112 mEq/L   CO2 26  19 - 32 mEq/L   Glucose, Bld 283 (*) 70 - 99 mg/dL   BUN 37 (*) 6 - 23 mg/dL   Creatinine, Ser 6.57 (*) 0.50 - 1.35 mg/dL   Calcium 9.4  8.4 - 84.6 mg/dL   GFR calc non Af Amer 24 (*) >90 mL/min   GFR calc Af Amer 28 (*) >90 mL/min  GLUCOSE, CAPILLARY      Result Value Range   Glucose-Capillary 273 (*) 70 - 99 mg/dL   Comment 1 Notify RN     Comment 2 Documented in Chart      Dg Foot Complete Right  04/24/2012  *RADIOLOGY REPORT*  Clinical Data: Right great toe infection, sore, bleeding, pain, history diabetes  RIGHT FOOT COMPLETE - 3+ VIEW  Comparison: None  Findings: Osseous demineralization. Joint spaces preserved. No acute fracture, dislocation, or bone destruction. Specifically no bone destruction seen at great toe to suggest osteomyelitis. Scattered small vessel vascular calcifications consistent with history of diabetes. No  radiopaque foreign body or soft tissue gas identified.  IMPRESSION: No acute abnormalities.   Original Report Authenticated By: Ulyses Southward, M.D.      1. Cellulitis   2. Diabetes   3. Hyperkalemia   4. CKD (chronic kidney disease) stage 3, GFR 30-59 ml/min   5. Diabetes mellitus type 2 with complications   6. Diabetic foot ulcer associated with type 2 diabetes mellitus   7. HTN (hypertension)   8. S/P BKA (below knee amputation), right   9. CAD (coronary artery disease)   10. PAD (peripheral artery disease)   11. Prostate cancer   12. Gangrene due to secondary diabetes mellitus   13. Acute kidney injury   14. Diabetic toe ulcer   15. Pre-operative cardiovascular examination   16. Atherosclerosis of native arteries of the extremities with gangrene   17. Atherosclerosis of native arteries of the extremities with intermittent claudication   18. Diabetic neuropathy, type II diabetes mellitus   19. Peripheral vascular disease, unspecified  MDM  Patient is a diabetic male with history of left below-the-knee amputation. He presents today with 2 days of worsening drainage from his right great toe. X-ray shows no acute abnormalities. He is afebrile. No concern for osteo-myelitis. Labs show hyperkalemia. hospitalist team consulted for admission for cellulitis and hyperkalemia in a diabetic. Dr. Anitra Lauth evaluated this patient and agrees with plan. Vital signs stable for transfer. Patient / Family / Caregiver informed of clinical course, understand medical decision-making process, and agree with plan.       I personally performed the services described in this documentation, which was scribed in my presence. The recorded information has been reviewed and is accurate.    Mora Bellman, PA-C 05/07/12 1102

## 2012-04-24 NOTE — ED Notes (Signed)
Pt states he has noticed bloody drainage on sock at R great toe for past several night. Pt is diabetic, states his blood sugar has been normal at home. Denies any pain or injuries to his toes.

## 2012-04-24 NOTE — ED Notes (Signed)
Pt states he does not want tylenol for his pain. Pain 5/10. States tylenol "won't help."

## 2012-04-25 DIAGNOSIS — I10 Essential (primary) hypertension: Secondary | ICD-10-CM

## 2012-04-25 DIAGNOSIS — E118 Type 2 diabetes mellitus with unspecified complications: Secondary | ICD-10-CM | POA: Diagnosis not present

## 2012-04-25 DIAGNOSIS — E875 Hyperkalemia: Secondary | ICD-10-CM | POA: Diagnosis not present

## 2012-04-25 LAB — GLUCOSE, CAPILLARY: Glucose-Capillary: 115 mg/dL — ABNORMAL HIGH (ref 70–99)

## 2012-04-25 LAB — CBC
Hemoglobin: 11.1 g/dL — ABNORMAL LOW (ref 13.0–17.0)
MCHC: 33.8 g/dL (ref 30.0–36.0)
RBC: 3.74 MIL/uL — ABNORMAL LOW (ref 4.22–5.81)

## 2012-04-25 LAB — BASIC METABOLIC PANEL
GFR calc Af Amer: 30 mL/min — ABNORMAL LOW (ref >90)
GFR calc non Af Amer: 25 mL/min — ABNORMAL LOW (ref >90)
Potassium: 5 mEq/L (ref 3.5–5.1)
Sodium: 140 mEq/L (ref 135–145)

## 2012-04-25 LAB — HEMOGLOBIN A1C
Hgb A1c MFr Bld: 8.7 % — ABNORMAL HIGH (ref <5.7)
Mean Plasma Glucose: 203 mg/dL — ABNORMAL HIGH (ref <117)

## 2012-04-25 LAB — MRSA PCR SCREENING: MRSA by PCR: NEGATIVE

## 2012-04-25 MED ORDER — ISOSORB DINITRATE-HYDRALAZINE 20-37.5 MG PO TABS
1.0000 | ORAL_TABLET | Freq: Two times a day (BID) | ORAL | Status: DC
  Administered 2012-04-25 (×2): 1 via ORAL
  Filled 2012-04-25 (×4): qty 1

## 2012-04-25 MED ORDER — SODIUM POLYSTYRENE SULFONATE 15 GM/60ML PO SUSP
15.0000 g | Freq: Once | ORAL | Status: AC
  Administered 2012-04-25: 15 g via ORAL
  Filled 2012-04-25: qty 60

## 2012-04-25 NOTE — Progress Notes (Signed)
[facility] PROGRESS NOTE Assessment/Plan: Hyperkalemia - Kayexalate, no EKG changes - At baseline. - K increase after first dose of Kayexalate. - give additional dose today. KVO IV fluids check a B-met   CKD (chronic kidney disease) stage 3, GFR 30-59 ml/min - Cr. At baseline. - start BiDil for Blood pressure management. - ACE causes recurrent hyperkalemia.   Diabetic foot ulcer associated with type 2 diabetes mellitus - improved controlled. - cont current management. - afebrile no leukocytosis. - x-ray showed no osteomyelitis.   Code Status: full Family Communication: none  Disposition Plan: home in 2-3 days.   Consultants:  none  Procedures:  Right foot x-ray May 19, 2012: no osteomyelitis.  Antibiotics:  none  HPI/Subjective: No complains  Objective: Filed Vitals:   05-19-12 2045 19-May-2012 2139 04/25/12 0419 04/25/12 0946  BP: 157/93 211/69 164/82 151/77  Pulse: 81 82 77 77  Temp:  97.8 F (36.6 C) 98.1 F (36.7 C) 97.9 F (36.6 C)  TempSrc:  Oral Oral   Resp: 15 16 16 18   Height:  5\' 8"  (1.727 m)    Weight:  94.6 kg (208 lb 8.9 oz)    SpO2: 99% 100% 99% 99%    Intake/Output Summary (Last 24 hours) at 04/25/12 1039 Last data filed at 04/25/12 0601  Gross per 24 hour  Intake      3 ml  Output    600 ml  Net   -597 ml   Filed Weights   2012-05-19 2139  Weight: 94.6 kg (208 lb 8.9 oz)    Exam:  General: Alert, awake, oriented x3, in no acute distress.  HEENT: No bruits, no goiter.  Heart: Regular rate and rhythm, without murmurs, rubs, gallops.  Lungs: Good air movement, clear to auscultation. Abdomen: Soft, nontender, nondistended, positive bowel sounds.  Neuro: Grossly intact, nonfocal.   Data Reviewed: Basic Metabolic Panel:  Recent Labs Lab May 19, 2012 1457 May 19, 2012 1646 May 19, 2012 2020 05-19-12 2153 04/25/12 0455  NA 140 141 144 143 140  K 6.2* 6.1* 4.5 5.6* 5.0  CL 105 107 107 107 106  CO2 26  --  26 27 27   GLUCOSE 283*  234* 70 170* 226*  BUN 37* 38* 35* 34* 32*  CREATININE 2.52* 2.30* 2.50* 2.53*  2.52* 2.39*  CALCIUM 9.4  --  10.1 9.5 8.8   Liver Function Tests: No results found for this basename: AST, ALT, ALKPHOS, BILITOT, PROT, ALBUMIN,  in the last 168 hours No results found for this basename: LIPASE, AMYLASE,  in the last 168 hours No results found for this basename: AMMONIA,  in the last 168 hours CBC:  Recent Labs Lab May 19, 2012 1457 05-19-2012 1646 19-May-2012 2153 04/25/12 0455  WBC 7.8  --  9.3 8.6  HGB 12.2* 12.9* 12.3* 11.1*  HCT 36.4* 38.0* 36.5* 32.8*  MCV 89.2  --  88.8 87.7  PLT 212  --  212 198   Cardiac Enzymes: No results found for this basename: CKTOTAL, CKMB, CKMBINDEX, TROPONINI,  in the last 168 hours BNP (last 3 results) No results found for this basename: PROBNP,  in the last 8760 hours CBG:  Recent Labs Lab 05-19-12 1509 05-19-12 2301 04/25/12 0602 04/25/12 0727  GLUCAP 273* 233* 182* 152*    No results found for this or any previous visit (from the past 240 hour(s)).   Studies: Dg Foot Complete Right  May 19, 2012  *RADIOLOGY REPORT*  Clinical Data: Right great toe infection, sore, bleeding, pain, history diabetes  RIGHT FOOT COMPLETE - 3+ VIEW  Comparison: None  Findings: Osseous demineralization. Joint spaces preserved. No acute fracture, dislocation, or bone destruction. Specifically no bone destruction seen at great toe to suggest osteomyelitis. Scattered small vessel vascular calcifications consistent with history of diabetes. No radiopaque foreign body or soft tissue gas identified.  IMPRESSION: No acute abnormalities.   Original Report Authenticated By: Ulyses Southward, M.D.     Scheduled Meds: . amLODipine  10 mg Oral q morning - 10a  . aspirin EC  81 mg Oral q morning - 10a  . doxazosin  1 mg Oral QHS  . doxycycline  100 mg Oral Q12H  . enoxaparin (LOVENOX) injection  30 mg Subcutaneous Q24H  . insulin aspart  0-15 Units Subcutaneous TID WC  . insulin  aspart  4 Units Subcutaneous TID WC  . insulin detemir  18 Units Subcutaneous QHS  . pregabalin  200 mg Oral BID  . simvastatin  5 mg Oral q1800  . sodium chloride  3 mL Intravenous Q12H   Continuous Infusions: . sodium chloride 1,000 mL (04/24/12 2253)     Marinda Elk  [facility] Pager [phone]. If 8PM-8AM, please contact night-coverage at [email or web address], password [facility] 04/25/2012, 10:39 AM  LOS: 1 day

## 2012-04-25 NOTE — Evaluation (Signed)
Physical Therapy Evaluation Patient Details Name: Aaron Long MRN: [id number] DOB: 05/05/1940 Today's Date: 04/25/2012 Time: 1534-1600 PT Time Calculation (min): 26 min  PT Assessment / Plan / Recommendation Clinical Impression  Pt is a 72 y.o. male with L BKA, who presented for bleeding in his toe. PT evaluation performed for suspicion of drop foot. Upon examination, patient demonstrates good clearance and activation of right ankle, no drop foot evident. Heel cord is tight, addressed with patient and providing heel cord stretching. In discussion and examination of patients shoe. It appears patient is currently wearing a diabetic shoe that is at minimum one size too big. This is causing patient to scuff into shoe during walking to hold shoe on foot and creating a 'turf toe' type of contact with the front of the shoe. Advised patient to use proper fitting footwear or potentially layer socks to avoid this friction and sliding inside the shoe. Patient does demonstrate instability with use of cane, rec continued use of rw as patient ambulates well with rw and demonstrates no significant balance deficits. Encouraged patient to return to outpatient PT for continued gait training with prosthesis as patient states he has just started wearing prosthesis again since recent revision. No further acute needs. Will sign off.    PT Assessment  Patent does not need any further PT services    Follow Up Recommendations  Outpatient PT;Supervision - Intermittent          Equipment Recommendations  None recommended by PT          Precautions / Restrictions Precautions Precautions: Fall Precaution Comments: uses LLE prosthetic Restrictions Weight Bearing Restrictions: No   Pertinent Vitals/Pain No pain at this time      Mobility  Bed Mobility Bed Mobility: Supine to Sit;Sitting - Scoot to Edge of Bed;Sit to Supine Supine to Sit: 5: Supervision Sitting - Scoot to Edge of Bed: 5: Supervision Sit  to Supine: 5: Supervision Transfers Transfers: Sit to Stand;Stand to Sit Sit to Stand: 4: Min guard Stand to Sit: 5: Supervision Ambulation/Gait Ambulation/Gait Assistance: 5: Supervision;4: Min guard Ambulation Distance (Feet): 80 Feet Assistive device: Rolling walker Ambulation/Gait Assistance Details: 10 ft with cane requires min guard with instability, with rw pt much more stable, no difficulty with ambulation. Pt with good clearence of RLE, no foot drop evident. Limited ROM in heel cord.  Gait Pattern: Step-through pattern;Decreased stride length;Trunk flexed;Narrow base of support Gait velocity: decreased General Gait Details: Pt steady with use of rw, rec continued use of this device vs cane, pt unsteady with cane Stairs: No    Exercises Other Exercises Other Exercises: Heel cord stretch, 10 sec holds 5 x (using sheet)     Visit Information  Last PT Received On: 04/25/12 Assistance Needed: +1    Subjective Data  Subjective: I just started using my cane again Patient Stated Goal: to go home   Prior Functioning  Home Living Lives With: Spouse Available Help at Discharge: Family;Available 24 hours/day Type of Home: Apartment (condo) Home Access: Stairs to enter [employer] of Steps: 1 Home Layout: One level Firefighter: Standard Home Adaptive Equipment: Straight cane;Walker - rolling Prior Function Level of Independence: Independent with assistive device(s) Able to Take Stairs?: Yes Driving: Yes (but wife drives mostly) Vocation: Retired Musician: No difficulties Dominant Hand: Right    Cognition  Cognition Overall Cognitive Status: Appears within functional limits for tasks assessed/performed Arousal/Alertness: Awake/alert Orientation Level: Appears intact for tasks assessed;Oriented X4 / Intact Behavior During Session:  Other (comment) (Initially agititated but calmed down with communication) Cognition - Other Comments:  Patient initially angry and questioning need for PT, after discussion pt more ammendable to PT evaluation    Extremity/Trunk Assessment Right Upper Extremity Assessment RUE ROM/Strength/Tone: WFL for tasks assessed RUE Sensation: WFL - Light Touch;WFL - Proprioception Left Upper Extremity Assessment LUE ROM/Strength/Tone: WFL for tasks assessed LUE Sensation: WFL - Light Touch;WFL - Proprioception Right Lower Extremity Assessment RLE ROM/Strength/Tone: WFL for tasks assessed (Limted ROM in heel cord) RLE Sensation: History of peripheral neuropathy Trunk Assessment Trunk Assessment: Normal   Balance    End of Session PT - End of Session Equipment Utilized During Treatment: Gait belt;Left lower extremity prosthesis Activity Tolerance: Patient tolerated treatment well Patient left: in bed;with call bell/phone within reach;with bed alarm set Nurse Communication: Mobility status  GP     Fabio Asa 04/25/2012, 4:21 PM [place] Crumb, PT DPT  [phone]

## 2012-04-26 DIAGNOSIS — E875 Hyperkalemia: Secondary | ICD-10-CM | POA: Diagnosis not present

## 2012-04-26 DIAGNOSIS — E1169 Type 2 diabetes mellitus with other specified complication: Secondary | ICD-10-CM | POA: Diagnosis not present

## 2012-04-26 DIAGNOSIS — E118 Type 2 diabetes mellitus with unspecified complications: Secondary | ICD-10-CM | POA: Diagnosis not present

## 2012-04-26 LAB — GLUCOSE, CAPILLARY
Glucose-Capillary: 62 mg/dL — ABNORMAL LOW (ref 70–99)
Glucose-Capillary: 87 mg/dL (ref 70–99)

## 2012-04-26 LAB — BASIC METABOLIC PANEL
Chloride: 109 mEq/L (ref 96–112)
GFR calc Af Amer: 29 mL/min — ABNORMAL LOW (ref >90)
Potassium: 4.3 mEq/L (ref 3.5–5.1)

## 2012-04-26 MED ORDER — ISOSORB DINITRATE-HYDRALAZINE 20-37.5 MG PO TABS: 1.0000 | ORAL_TABLET | Freq: Two times a day (BID) | ORAL | Status: DC

## 2012-04-26 NOTE — Progress Notes (Signed)
04/26/2012 8:05 AM Nursing note Discharge avs form, follow up appointment and when to call MD reviewed. Pt. States he did not want to stay to take morning medications and would take them upon arrival home. New rx reviewed and given to patient. Wound care center follow up discussed. Pt. States he is already scheduled for outpatient physical therapy as well as not in need of any  Home health or DME equipment. Questions and concerns addressed. D/c iv line. D/c tele. D/c home with wife per orders.  Kamber Vignola, Blanchard Kelch

## 2012-04-26 NOTE — Discharge Summary (Signed)
Physician Discharge Summary  Aaron Long ZOX:[id number] DOB: 1940/03/06 DOA: 04/24/2012  PCP: Oneal Grout, MD  Admit date: 04/24/2012 Discharge date: 04/26/2012  Time spent: 35 minutes minutes  Recommendations for Outpatient Follow-up:  1. Follow up with PCP in 1 week, check a b-met titrate Bp medications as tolerate it. Will add a diuretics if needed. AVOID ACE-I, causes hyperkalemia.  Discharge Diagnoses:  Principal Problem:   Hyperkalemia Active Problems:   Diabetic foot ulcer associated with type 2 diabetes mellitus   CKD (chronic kidney disease) stage 3, GFR 30-59 ml/min   S/P BKA (below knee amputation)   Discharge Condition: stable  Diet recommendation: carb modified  Filed Weights   04/24/12 2139  Weight: 94.6 kg (208 lb 8.9 oz)    History of present illness:  72 year old black man with a history of diabetes, peripheral vascular disease and stage III chronic kidney disease. He has had a prior left BKA and is currently wearing a metal prosthesis that he has only been wearing for a few weeks. As he is getting used to his prosthesis, he is dragging his right foot with his gait. He has developed a small wound over his right big toe where his nail was digging into the skin. EDP trimmed his nails and was going to send him home on oral antibiotics, however blood work showed hyperkalemia with a potassium of 6.2 and hence we have been asked to admit him for further evaluation and management. He denies fevers, chills, chest pain, shortness of breath or any other symptoms.   Hospital Course:  Hyperkalemia: - due to ACE.  - Kayexalate, no EKG changes  - K increase after first dose of Kayexalate.  - Gave additional dose.B-met checked resolved. - b-met in 1 week with PCP.  CKD (chronic kidney disease) stage 3, GFR 30-59 ml/min  - Cr. At baseline.  - start BiDil for Blood pressure management.  - ACE causes recurrent hyperkalemia.   Diabetic foot ulcer associated with  type 2 diabetes mellitus  - improved controlled.  - cont current management.  - afebrile no leukocytosis.  - x-ray showed no osteomyelitis - follow up with wound care.   Procedures:  X-ray of foot.  Consultations:  none  Discharge Exam: Filed Vitals:   04/25/12 1319 04/25/12 1415 04/25/12 1933 04/26/12 0413  BP: 192/81 151/73 118/60 142/84  Pulse: 85 81 77 78  Temp: 98.2 F (36.8 C)  98.4 F (36.9 C) 98 F (36.7 C)  TempSrc:   Oral Oral  Resp: 18 18 18 18   Height:      Weight:      SpO2: 98% 97% 98% 97%    General: A&O x3 Cardiovascular: RRR Respiratory: good air movement CTA B/L  Discharge Instructions  Discharge Orders   Future Appointments Provider Department Dept Phone   05/01/2012 9:45 AM Fraser Din, PT [facility] [phone]   05/23/2012 11:30 AM Oneal Grout, MD [facility] [phone]   Future Orders Complete By Expires     Diet - low sodium heart healthy  As directed     Increase activity slowly  As directed         Medication List    STOP taking these medications       lisinopril 10 MG tablet  Commonly known as:  PRINIVIL,ZESTRIL      TAKE these medications       amLODipine 10 MG tablet  Commonly known as:  NORVASC  Take 1 tablet (10 mg  total) by mouth every morning.     aspirin EC 81 MG tablet  Take 81 mg by mouth every morning.     doxazosin 1 MG tablet  Commonly known as:  CARDURA  Take 1/2 tablet by mouth at bedtime for blood pressure     isosorbide-hydrALAZINE 20-37.5 MG per tablet  Commonly known as:  BIDIL  Take 1 tablet by mouth 2 (two) times daily.     LEVEMIR FLEXPEN 100 UNIT/ML injection  Generic drug:  insulin detemir  Inject 18 Units into the skin at bedtime.     NOVOLOG FLEXPEN 100 UNIT/ML injection  Generic drug:  insulin aspart  Inject 5 Units into the skin 3 (three) times daily before meals.     oxyCODONE 5 MG immediate release tablet  Commonly known  as:  Oxy IR/ROXICODONE  Take 1 tablet (5 mg total) by mouth every 8 (eight) hours as needed for pain. Take one tablet every 4 hours as needed for pain     pravastatin 80 MG tablet  Commonly known as:  PRAVACHOL  Take 0.5 tablets (40 mg total) by mouth every morning.     pregabalin 100 MG capsule  Commonly known as:  LYRICA  Take 2 capsules (200 mg total) by mouth 2 (two) times daily.           Follow-up Information   Follow up with Oneal Grout, MD In 2 days.   Contact information:   [address] [place] [address] [phone]       Follow up with [facility]              In 2 days.   Contact information:   [address] [place] [address] [phone]       The results of significant diagnostics from this hospitalization (including imaging, microbiology, ancillary and laboratory) are listed below for reference.    Significant Diagnostic Studies: Dg Foot Complete Right  05/11/2012  *RADIOLOGY REPORT*  Clinical Data: Right great toe infection, sore, bleeding, pain, history diabetes  RIGHT FOOT COMPLETE - 3+ VIEW  Comparison: None  Findings: Osseous demineralization. Joint spaces preserved. No acute fracture, dislocation, or bone destruction. Specifically no bone destruction seen at great toe to suggest osteomyelitis. Scattered small vessel vascular calcifications consistent with history of diabetes. No radiopaque foreign body or soft tissue gas identified.  IMPRESSION: No acute abnormalities.   Original Report Authenticated By: Ulyses Southward, M.D.     Microbiology: Recent Results (from the past 240 hour(s))  MRSA PCR SCREENING     Status: None   Collection Time    04/25/12 11:05 AM      Result Value Range Status   MRSA by PCR NEGATIVE  NEGATIVE Final   Comment:            The GeneXpert MRSA Assay (FDA     approved for NASAL specimens     only), is one component of a     comprehensive MRSA colonization      surveillance program. It is not     intended to diagnose MRSA     infection nor to guide or     monitor treatment for     MRSA infections.     Labs: Basic Metabolic Panel:  Recent Labs Lab May 11, 2012 1457 05-11-2012 1646 2012/05/11 2020 2012/05/11 2153 04/25/12 0455 04/26/12 0500  NA 140 141 144 143 140 144  K 6.2* 6.1* 4.5 5.6* 5.0 4.3  CL 105 107 107 107 106 109  CO2 26  --  26 27 27 27   GLUCOSE 283* 234* 70 170* 226* 77  BUN 37* 38* 35* 34* 32* 28*  CREATININE 2.52* 2.30* 2.50* 2.53*  2.52* 2.39* 2.42*  CALCIUM 9.4  --  10.1 9.5 8.8 8.7   Liver Function Tests: No results found for this basename: AST, ALT, ALKPHOS, BILITOT, PROT, ALBUMIN,  in the last 168 hours No results found for this basename: LIPASE, AMYLASE,  in the last 168 hours No results found for this basename: AMMONIA,  in the last 168 hours CBC:  Recent Labs Lab 04/24/12 1457 04/24/12 1646 04/24/12 2153 04/25/12 0455  WBC 7.8  --  9.3 8.6  HGB 12.2* 12.9* 12.3* 11.1*  HCT 36.4* 38.0* 36.5* 32.8*  MCV 89.2  --  88.8 87.7  PLT 212  --  212 198   Cardiac Enzymes: No results found for this basename: CKTOTAL, CKMB, CKMBINDEX, TROPONINI,  in the last 168 hours BNP: BNP (last 3 results) No results found for this basename: PROBNP,  in the last 8760 hours CBG:  Recent Labs Lab 04/25/12 0602 04/25/12 0727 04/25/12 1124 04/25/12 1629 04/25/12 2112  GLUCAP 182* 152* 81 169* 115*       Signed:  FELIZ ORTIZ, Tobie Perdue  [facility] 04/26/2012, 7:25 AM

## 2012-05-01 ENCOUNTER — Ambulatory Visit: Payer: Medicare Other | Admitting: Physical Therapy

## 2012-05-02 ENCOUNTER — Ambulatory Visit: Payer: Medicare Other | Admitting: Internal Medicine

## 2012-05-02 DIAGNOSIS — Z0289 Encounter for other administrative examinations: Secondary | ICD-10-CM

## 2012-05-03 ENCOUNTER — Ambulatory Visit: Payer: Medicare Other | Admitting: Internal Medicine

## 2012-05-05 DIAGNOSIS — R809 Proteinuria, unspecified: Secondary | ICD-10-CM | POA: Diagnosis not present

## 2012-05-09 ENCOUNTER — Emergency Department (HOSPITAL_COMMUNITY): Payer: Medicare Other

## 2012-05-09 ENCOUNTER — Emergency Department (HOSPITAL_COMMUNITY)
Admission: EM | Admit: 2012-05-09 | Discharge: 2012-05-09 | Disposition: A | Discharge status: Home / Self Care | Payer: Medicare Other | Attending: Emergency Medicine | Admitting: Emergency Medicine

## 2012-05-09 ENCOUNTER — Encounter (HOSPITAL_COMMUNITY): Payer: Self-pay | Admitting: Emergency Medicine

## 2012-05-09 DIAGNOSIS — L97509 Non-pressure chronic ulcer of other part of unspecified foot with unspecified severity: Secondary | ICD-10-CM | POA: Diagnosis not present

## 2012-05-09 DIAGNOSIS — Z951 Presence of aortocoronary bypass graft: Secondary | ICD-10-CM | POA: Insufficient documentation

## 2012-05-09 DIAGNOSIS — Z87442 Personal history of urinary calculi: Secondary | ICD-10-CM | POA: Insufficient documentation

## 2012-05-09 DIAGNOSIS — Z860100 Personal history of colon polyps, unspecified: Secondary | ICD-10-CM | POA: Insufficient documentation

## 2012-05-09 DIAGNOSIS — Z7982 Long term (current) use of aspirin: Secondary | ICD-10-CM | POA: Diagnosis not present

## 2012-05-09 DIAGNOSIS — I10 Essential (primary) hypertension: Secondary | ICD-10-CM | POA: Diagnosis not present

## 2012-05-09 DIAGNOSIS — E118 Type 2 diabetes mellitus with unspecified complications: Secondary | ICD-10-CM

## 2012-05-09 DIAGNOSIS — I252 Old myocardial infarction: Secondary | ICD-10-CM | POA: Insufficient documentation

## 2012-05-09 DIAGNOSIS — E785 Hyperlipidemia, unspecified: Secondary | ICD-10-CM | POA: Insufficient documentation

## 2012-05-09 DIAGNOSIS — Z794 Long term (current) use of insulin: Secondary | ICD-10-CM | POA: Diagnosis not present

## 2012-05-09 DIAGNOSIS — E1142 Type 2 diabetes mellitus with diabetic polyneuropathy: Secondary | ICD-10-CM | POA: Diagnosis not present

## 2012-05-09 DIAGNOSIS — S91109A Unspecified open wound of unspecified toe(s) without damage to nail, initial encounter: Secondary | ICD-10-CM | POA: Diagnosis not present

## 2012-05-09 DIAGNOSIS — I2510 Atherosclerotic heart disease of native coronary artery without angina pectoris: Secondary | ICD-10-CM | POA: Insufficient documentation

## 2012-05-09 DIAGNOSIS — M79609 Pain in unspecified limb: Secondary | ICD-10-CM | POA: Insufficient documentation

## 2012-05-09 DIAGNOSIS — Z8679 Personal history of other diseases of the circulatory system: Secondary | ICD-10-CM | POA: Insufficient documentation

## 2012-05-09 DIAGNOSIS — Z9861 Coronary angioplasty status: Secondary | ICD-10-CM | POA: Insufficient documentation

## 2012-05-09 DIAGNOSIS — M79674 Pain in right toe(s): Secondary | ICD-10-CM

## 2012-05-09 DIAGNOSIS — Z8546 Personal history of malignant neoplasm of prostate: Secondary | ICD-10-CM | POA: Diagnosis not present

## 2012-05-09 DIAGNOSIS — Z8719 Personal history of other diseases of the digestive system: Secondary | ICD-10-CM | POA: Insufficient documentation

## 2012-05-09 DIAGNOSIS — Z87891 Personal history of nicotine dependence: Secondary | ICD-10-CM | POA: Insufficient documentation

## 2012-05-09 DIAGNOSIS — Z79899 Other long term (current) drug therapy: Secondary | ICD-10-CM | POA: Insufficient documentation

## 2012-05-09 DIAGNOSIS — E1149 Type 2 diabetes mellitus with other diabetic neurological complication: Secondary | ICD-10-CM | POA: Diagnosis not present

## 2012-05-09 DIAGNOSIS — N400 Enlarged prostate without lower urinary tract symptoms: Secondary | ICD-10-CM | POA: Insufficient documentation

## 2012-05-09 DIAGNOSIS — E1140 Type 2 diabetes mellitus with diabetic neuropathy, unspecified: Secondary | ICD-10-CM

## 2012-05-09 DIAGNOSIS — R234 Changes in skin texture: Secondary | ICD-10-CM

## 2012-05-09 NOTE — ED Notes (Signed)
Pt has been ahaving trouble with pain and drainage to rt great toe for the past 10 days now. Not getting any better. Drainage noted to pts sock.

## 2012-05-09 NOTE — ED Provider Notes (Signed)
History     CSN: [id number]  Arrival date & time 05/09/12  1048   First MD Initiated Contact with Patient 05/09/12 1217      Chief Complaint  Patient presents with  . Toe Pain    (Consider location/radiation/quality/duration/timing/severity/associated sxs/prior treatment) Patient is a 72 y.o. male presenting with toe pain. The history is provided by the patient and medical records. No language interpreter was used.  Toe Pain This is a new problem. The current episode started 1 to 4 weeks ago. The problem occurs intermittently. The problem has been unchanged. Pertinent negatives include no anorexia, arthralgias, change in bowel habit, chest pain, chills, fever, headaches, joint swelling, myalgias, nausea, neck pain, numbness, rash, swollen glands, urinary symptoms, vertigo, visual change, vomiting or weakness. Nothing aggravates the symptoms. He has tried nothing for the symptoms.    Aaron Long is a 72 y.o. male  with a hx of diabetes, left leg BKA, gangrene of toe from MI presents to the Emergency Department complaining of gradual, persistent, progressively worsening color change of his right great toe. Patient states he banged it while using his walker and had concerns about it. He was seen in [facility] on for 14 2014 for drainage from the edge of the right great toe.  He states that they took his toenail off and things seem to be improving significantly but in the last several days he's had color change in the skin and development of a "spot" on the tip of his right great toe. Patient denies drainage out of the site. Nothing makes it better or worse. Patient denies fever, chills, headache, neck pain, chest pain, shortness of breath, abdominal pain, nausea, vomiting, diarrhea, weakness, dizziness, syncope dysuria, hematuria, drainage from the site.  Past Medical History  Diagnosis Date  . Prostate cancer 2010  . PAD (peripheral artery disease)   . Gangrene of toe     dry  .  Neuromuscular disorder     diabetic neuropathy  . Hypertension     takes Amlodipine daily and Metoprolol bid  . Hyperlipidemia     takes Pravastatin daily  . Myocardial infarction 2005  . Peripheral neuropathy   . Constipation     takes Miralax daily  . Hemorrhoids   . Hx of colonic polyps   . History of kidney stones   . Diabetes mellitus     Novolog and Levemir daily  . Coronary artery disease   . BPH (benign prostatic hypertrophy)     Past Surgical History  Procedure Laterality Date  . Prostate surgery  2009    prostatectomy  . Knee cartilage surgery  at age 75    left knee  . Cea      Right  . Coronary artery bypass graft  2005    x 4  . Carotid endarterectomy  2005    right  . Cataract surgery  2011    bilateral  . Colonoscopy    . Cardiac catheterization  05/28/11  . Amputation  06/14/2011    Procedure: AMPUTATION DIGIT;  Surgeon: Sherren Kerns, MD;  Location: [facility] OR;  Service: Vascular;  Laterality: Left;  Amputation Left fifth toe  . Amputation  06/16/2011    Procedure: AMPUTATION BELOW KNEE;  Surgeon: Sherren Kerns, MD;  Location: [facility] OR;  Service: Vascular;  Laterality: Left;  . I&d extremity  01/31/2012    Procedure: IRRIGATION AND DEBRIDEMENT EXTREMITY;  Surgeon: Sherren Kerns, MD;  Location: [facility] OR;  Service: Vascular;  Laterality: Left;  I & D Left BKA     Family History  Problem Relation Age of Onset  . Coronary artery disease Neg Hx   . Anesthesia problems Neg Hx   . Hypotension Neg Hx   . Malignant hyperthermia Neg Hx   . Pseudochol deficiency Neg Hx   . Hyperlipidemia Mother   . Hypertension Mother   . Cancer Mother   . Hyperlipidemia Father   . Hypertension Father   . Kidney disease Father   . Heart disease Sister   . Alzheimer's disease Sister   . Cancer Brother   . Other Sister     History  Substance Use Topics  . Smoking status: Former Smoker -- 1.00 packs/day for 40 years    Types: Cigarettes    Quit date: 04/28/1991  .  Smokeless tobacco: Never Used     Comment: occ alcohol  . Alcohol Use: Yes     Comment: occasional      Review of Systems  Constitutional: Negative for fever and chills.  HENT: Negative for neck pain and neck stiffness.   Respiratory: Negative for shortness of breath.   Cardiovascular: Negative for chest pain.  Gastrointestinal: Negative for nausea, vomiting, anorexia and change in bowel habit.  Musculoskeletal: Negative for myalgias, joint swelling and arthralgias.  Skin: Negative for rash and wound.  Neurological: Negative for vertigo, weakness, numbness and headaches.  Hematological: Does not bruise/bleed easily.  Psychiatric/Behavioral: The patient is not nervous/anxious.   All other systems reviewed and are negative.    Allergies  Review of patient's allergies indicates no known allergies.  Home Medications   Current Outpatient Rx  Name  Route  Sig  Dispense  Refill  . amLODipine (NORVASC) 10 MG tablet   Oral   Take 1 tablet (10 mg total) by mouth every morning.   30 tablet   5   . aspirin EC 81 MG tablet   Oral   Take 81 mg by mouth every morning.          . docusate sodium (COLACE) 100 MG capsule   Oral   Take 100 mg by mouth 2 (two) times daily as needed for constipation.         [name] doxazosin (CARDURA) 1 MG tablet   Oral   Take 0.5 mg by mouth at bedtime. Take 1/2 tablet by mouth at bedtime for blood pressure         . insulin aspart (NOVOLOG FLEXPEN) 100 UNIT/ML injection   Subcutaneous   Inject 0-5 Units into the skin 3 (three) times daily before meals. Sliding scale 0 units=below 150 anything greater 5 units         . insulin detemir (LEVEMIR FLEXPEN) 100 UNIT/ML injection   Subcutaneous   Inject 18 Units into the skin at bedtime.         . isosorbide-hydrALAZINE (BIDIL) 20-37.5 MG per tablet   Oral   Take 1 tablet by mouth 2 (two) times daily.   60 tablet   0   . polyethylene glycol (MIRALAX / GLYCOLAX) packet   Oral   Take 17 g by  mouth daily.         . pravastatin (PRAVACHOL) 80 MG tablet   Oral   Take 40 mg by mouth every morning.         . pregabalin (LYRICA) 200 MG capsule   Oral   Take 200 mg by mouth 2 (two) times daily.         [name]  oxyCODONE (OXY IR/ROXICODONE) 5 MG immediate release tablet   Oral   Take 1 tablet (5 mg total) by mouth every 8 (eight) hours as needed for pain. Take one tablet every 4 hours as needed for pain   180 tablet   0     BP 122/56  Pulse 85  Temp(Src) 98.6 F (37 C) (Oral)  Resp 16  SpO2 95%  Physical Exam  Nursing note and vitals reviewed. Constitutional: He appears well-developed and well-nourished. No distress.  HENT:  Head: Normocephalic and atraumatic.  Eyes: Conjunctivae are normal.  Neck: Normal range of motion.  Cardiovascular: Normal rate, regular rhythm, normal heart sounds and intact distal pulses.   Capillary refill less than 3 seconds  Pulmonary/Chest: Effort normal and breath sounds normal.  Musculoskeletal: He exhibits no edema and no tenderness.  ROM: Full range of motion of the right toes, right foot and right ankle  Neurological: He is alert. Coordination normal.  Sensation to baseline Strength 5 out of 5 in the right toes, right foot and right ankle  Skin: Skin is warm and dry. No rash noted. He is not diaphoretic. No erythema.  No tenting of the skin Darkening of the skin along the right great toe with eschar, no drainage, no erythema, no area of induration or fluctuance  Psychiatric: He has a normal mood and affect.    ED Course  Procedures (including critical care time)  Labs Reviewed - No data to display Dg Toe Great Right  05/09/2012  *RADIOLOGY REPORT*  Clinical Data: Right great toe wound, rule out osteomyelitis  RIGHT GREAT TOE  Comparison: 04/24/2012  Findings: Three views of the right great toe submitted.  Osteopenia again noted.  No acute fracture or subluxation.  No periosteal reaction or bony erosion.  IMPRESSION: No acute  fracture or subluxation.  No periosteal reaction or bony erosion.  Diffuse osteopenia.   Original Report Authenticated By: Natasha Mead, M.D.      1. Toe pain, right   2. Eschar of toe   3. Diabetic neuropathy, type II diabetes mellitus   4. Diabetes mellitus type 2 with complications       MDM  DONYEL NESTER presents with concerns of wound on the right toe.  X-ray without evidence of fracture. I personally reviewed the imaging tests through PACS system.  I reviewed available ER/hospitalization records through the EMR.  Patient is a two-view that he was seen on 04/24/2012 for right great toe saline test, his toenail was removed at that visit. Today's evaluation of his foot is without evidence of infection or cellulitis, no induration or area of fluctuance. Discharge seems to be healing well.  Encouraged warm soaks and careful foot inspection each day.        Dahlia Client Laticha Ferrucci, PA-C 05/09/12 1310

## 2012-05-10 ENCOUNTER — Ambulatory Visit: Payer: Medicare Other | Admitting: Physical Therapy

## 2012-05-10 NOTE — ED Provider Notes (Signed)
Medical screening examination/treatment/procedure(s) were conducted as a shared visit with non-physician practitioner(s) and myself.  I personally evaluated the patient during the encounter.  No clinical evidence of cellulitis or arterial embolism.  Small area of eschar at the end of the toe  Donnetta Hutching, MD 05/10/12 1733

## 2012-05-11 DIAGNOSIS — E119 Type 2 diabetes mellitus without complications: Secondary | ICD-10-CM | POA: Diagnosis not present

## 2012-05-11 DIAGNOSIS — E1142 Type 2 diabetes mellitus with diabetic polyneuropathy: Secondary | ICD-10-CM | POA: Diagnosis not present

## 2012-05-11 DIAGNOSIS — I129 Hypertensive chronic kidney disease with stage 1 through stage 4 chronic kidney disease, or unspecified chronic kidney disease: Secondary | ICD-10-CM | POA: Diagnosis not present

## 2012-05-12 ENCOUNTER — Other Ambulatory Visit: Payer: Self-pay | Admitting: *Deleted

## 2012-05-12 DIAGNOSIS — Z89512 Acquired absence of left leg below knee: Secondary | ICD-10-CM

## 2012-05-12 MED ORDER — OXYCODONE HCL 5 MG PO TABS: ORAL_TABLET | ORAL | Status: DC

## 2012-05-12 NOTE — ED Provider Notes (Signed)
Medical screening examination/treatment/procedure(s) were conducted as a shared visit with non-physician practitioner(s) and myself.  I personally evaluated the patient during the encounter  Patient with small diabetic toe ulcer from his toenail rubbing against his skin. However today patient's potassium is greater than 6 persistently with a normal EKG. Creatinine is not abnormal. Unclear why potassium is still high. Discussed with internal medicine and they will admit for further evaluation.  Gwyneth Sprout, MD 05/12/12 1400

## 2012-05-17 DIAGNOSIS — M2010 Hallux valgus (acquired), unspecified foot: Secondary | ICD-10-CM | POA: Diagnosis not present

## 2012-05-17 DIAGNOSIS — M79609 Pain in unspecified limb: Secondary | ICD-10-CM | POA: Diagnosis not present

## 2012-05-17 DIAGNOSIS — L97509 Non-pressure chronic ulcer of other part of unspecified foot with unspecified severity: Secondary | ICD-10-CM | POA: Diagnosis not present

## 2012-05-17 DIAGNOSIS — L02619 Cutaneous abscess of unspecified foot: Secondary | ICD-10-CM | POA: Diagnosis not present

## 2012-05-17 DIAGNOSIS — L03039 Cellulitis of unspecified toe: Secondary | ICD-10-CM | POA: Diagnosis not present

## 2012-05-17 DIAGNOSIS — E119 Type 2 diabetes mellitus without complications: Secondary | ICD-10-CM | POA: Diagnosis not present

## 2012-05-18 DIAGNOSIS — E11311 Type 2 diabetes mellitus with unspecified diabetic retinopathy with macular edema: Secondary | ICD-10-CM | POA: Diagnosis not present

## 2012-05-18 DIAGNOSIS — H35049 Retinal micro-aneurysms, unspecified, unspecified eye: Secondary | ICD-10-CM | POA: Diagnosis not present

## 2012-05-18 DIAGNOSIS — H35359 Cystoid macular degeneration, unspecified eye: Secondary | ICD-10-CM | POA: Diagnosis not present

## 2012-05-18 DIAGNOSIS — E11359 Type 2 diabetes mellitus with proliferative diabetic retinopathy without macular edema: Secondary | ICD-10-CM | POA: Diagnosis not present

## 2012-05-18 DIAGNOSIS — E1139 Type 2 diabetes mellitus with other diabetic ophthalmic complication: Secondary | ICD-10-CM | POA: Diagnosis not present

## 2012-05-22 ENCOUNTER — Encounter: Payer: Self-pay | Admitting: Geriatric Medicine

## 2012-05-22 ENCOUNTER — Other Ambulatory Visit: Payer: Self-pay | Admitting: Podiatry

## 2012-05-22 ENCOUNTER — Other Ambulatory Visit: Payer: Self-pay | Admitting: *Deleted

## 2012-05-22 DIAGNOSIS — M869 Osteomyelitis, unspecified: Secondary | ICD-10-CM | POA: Diagnosis not present

## 2012-05-22 DIAGNOSIS — L97909 Non-pressure chronic ulcer of unspecified part of unspecified lower leg with unspecified severity: Secondary | ICD-10-CM

## 2012-05-22 DIAGNOSIS — L03119 Cellulitis of unspecified part of limb: Secondary | ICD-10-CM | POA: Diagnosis not present

## 2012-05-22 DIAGNOSIS — L97509 Non-pressure chronic ulcer of other part of unspecified foot with unspecified severity: Secondary | ICD-10-CM | POA: Diagnosis not present

## 2012-05-22 DIAGNOSIS — L02619 Cutaneous abscess of unspecified foot: Secondary | ICD-10-CM | POA: Diagnosis not present

## 2012-05-22 DIAGNOSIS — I739 Peripheral vascular disease, unspecified: Secondary | ICD-10-CM

## 2012-05-23 ENCOUNTER — Ambulatory Visit (INDEPENDENT_AMBULATORY_CARE_PROVIDER_SITE_OTHER): Payer: Medicare Other | Admitting: Internal Medicine

## 2012-05-23 ENCOUNTER — Encounter: Payer: Self-pay | Admitting: Internal Medicine

## 2012-05-23 VITALS — BP 140/86 | HR 93 | Temp 98.1°F | Resp 15 | Wt 209.4 lb

## 2012-05-23 DIAGNOSIS — Z89512 Acquired absence of left leg below knee: Secondary | ICD-10-CM

## 2012-05-23 DIAGNOSIS — E1140 Type 2 diabetes mellitus with diabetic neuropathy, unspecified: Secondary | ICD-10-CM

## 2012-05-23 DIAGNOSIS — E1149 Type 2 diabetes mellitus with other diabetic neurological complication: Secondary | ICD-10-CM

## 2012-05-23 DIAGNOSIS — M869 Osteomyelitis, unspecified: Secondary | ICD-10-CM

## 2012-05-23 DIAGNOSIS — M9080 Osteopathy in diseases classified elsewhere, unspecified site: Secondary | ICD-10-CM

## 2012-05-23 DIAGNOSIS — S88119A Complete traumatic amputation at level between knee and ankle, unspecified lower leg, initial encounter: Secondary | ICD-10-CM | POA: Diagnosis not present

## 2012-05-23 DIAGNOSIS — E1169 Type 2 diabetes mellitus with other specified complication: Secondary | ICD-10-CM

## 2012-05-23 DIAGNOSIS — E785 Hyperlipidemia, unspecified: Secondary | ICD-10-CM

## 2012-05-23 DIAGNOSIS — E1142 Type 2 diabetes mellitus with diabetic polyneuropathy: Secondary | ICD-10-CM

## 2012-05-23 DIAGNOSIS — I10 Essential (primary) hypertension: Secondary | ICD-10-CM

## 2012-05-23 MED ORDER — OXYCODONE HCL 5 MG PO TABS: ORAL_TABLET | ORAL | Status: DC

## 2012-05-23 NOTE — Progress Notes (Signed)
Subjective:    Patient ID: Aaron Long, male    DOB: 09/05/1940, 72 y.o.   MRN: [id number]  HPI  Aaron Long is here with mrs Wiltsey for routine follow up. His sugar has been under better control with recent dietary modification. He was recently admitted to hospital with hyperkalemia which was thought to be from him being on ACEI. It was corrected, ACEI stopped and he was discharged home. He also follows with podiatry and vascular. He has a wound on his great toe and bone biopsy was sent yesterday with concerns for osteomyelitis and he has been put on a month course of keflex. He has also been referred to vascular for further assessment of PAD and has upcoming appointment. His blood pressure appears controlled on current regimen  Review of Systems  Constitutional: Negative for activity change and fatigue.  HENT: Negative for congestion and mouth sores.   Respiratory: Negative for cough and shortness of breath.   Cardiovascular: Negative for chest pain.  Gastrointestinal: Negative for abdominal pain and abdominal distention.  Genitourinary: Negative for dysuria.  Musculoskeletal: Positive for arthralgias and gait problem.  Neurological: Negative for dizziness.  Hematological: Negative for adenopathy.  Psychiatric/Behavioral: Negative for agitation.      Objective:   Physical Exam  Constitutional: He is oriented to person, place, and time. He appears well-developed and well-nourished. No distress.  HENT:  Head: Normocephalic and atraumatic.  Mouth/Throat: Oropharynx is clear and moist.  Eyes: Pupils are equal, round, and reactive to light.  Neck: Normal range of motion. Neck supple. No thyromegaly present.  Cardiovascular: Normal rate.   Pulmonary/Chest: Effort normal and breath sounds normal.  Abdominal: Soft. Bowel sounds are normal. He exhibits no distension.  Musculoskeletal: He exhibits no edema.  Left BKA and has prosthetic in place. Right toe is in dressing, clean for now,  tender on exam  Lymphadenopathy:    He has no cervical adenopathy.  Neurological: He is alert and oriented to person, place, and time. No cranial nerve deficit.  Skin: Skin is warm and dry. He is not diaphoretic.  Psychiatric: He has a normal mood and affect.    BP 140/86  Pulse 93  Temp(Src) 98.1 F (36.7 C) (Oral)  Resp 15  Wt 209 lb 6.4 oz (94.983 kg)  BMI 31.85 kg/m2  No visits with results within 3 Week(s) from this visit. Latest known visit with results is:  Admission on 04/24/2012, Discharged on 04/26/2012  Component Date Value Range Status  . WBC 04/24/2012 7.8  4.0 - 10.5 K/uL Final  . RBC 04/24/2012 4.08* 4.22 - 5.81 MIL/uL Final  . Hemoglobin 04/24/2012 12.2* 13.0 - 17.0 g/dL Final  . HCT 40/98/1191 36.4* 39.0 - 52.0 % Final  . MCV 04/24/2012 89.2  78.0 - 100.0 fL Final  . MCH 04/24/2012 29.9  26.0 - 34.0 pg Final  . MCHC 04/24/2012 33.5  30.0 - 36.0 g/dL Final  . RDW 47/82/9562 13.8  11.5 - 15.5 % Final  . Platelets 04/24/2012 212  150 - 400 K/uL Final  . Sodium 04/24/2012 140  135 - 145 mEq/L Final  . Potassium 04/24/2012 6.2* 3.5 - 5.1 mEq/L Final  . Chloride 04/24/2012 105  96 - 112 mEq/L Final  . CO2 04/24/2012 26  19 - 32 mEq/L Final  . Glucose, Bld 04/24/2012 283* 70 - 99 mg/dL Final  . BUN 13/08/6576 37* 6 - 23 mg/dL Final  . Creatinine, Ser 04/24/2012 2.52* 0.50 - 1.35 mg/dL Final  . Calcium  04/24/2012 9.4  8.4 - 10.5 mg/dL Final  . GFR calc non Af Amer 04/24/2012 24* >90 mL/min Final  . GFR calc Af Amer 04/24/2012 28* >90 mL/min Final   Comment:                                 The eGFR has been calculated                          using the CKD EPI equation.                          This calculation has not been                          validated in all clinical                          situations.                          eGFR's persistently                          <90 mL/min signify                          possible Chronic Kidney Disease.  .  Glucose-Capillary 04/24/2012 273* 70 - 99 mg/dL Final  . Comment 1 62/13/0865 Notify RN   Final  . Comment 2 04/24/2012 Documented in Chart   Final  . Sodium 04/24/2012 141  135 - 145 mEq/L Final  . Potassium 04/24/2012 6.1* 3.5 - 5.1 mEq/L Final  . Chloride 04/24/2012 107  96 - 112 mEq/L Final  . BUN 04/24/2012 38* 6 - 23 mg/dL Final  . Creatinine, Ser 04/24/2012 2.30* 0.50 - 1.35 mg/dL Final  . Glucose, Bld 78/46/9629 234* 70 - 99 mg/dL Final  . Calcium, Ion 52/84/1324 1.29  1.13 - 1.30 mmol/L Final  . TCO2 04/24/2012 28  0 - 100 mmol/L Final  . Hemoglobin 04/24/2012 12.9* 13.0 - 17.0 g/dL Final  . HCT 40/10/2723 38.0* 39.0 - 52.0 % Final  . Comment 04/24/2012 NOTIFIED PHYSICIAN   Final  . Sodium 04/24/2012 144  135 - 145 mEq/L Final  . Potassium 04/24/2012 4.5  3.5 - 5.1 mEq/L Final   Comment: DELTA CHECK NOTED                          NO VISIBLE HEMOLYSIS  . Chloride 04/24/2012 107  96 - 112 mEq/L Final  . CO2 04/24/2012 26  19 - 32 mEq/L Final  . Glucose, Bld 04/24/2012 70  70 - 99 mg/dL Final  . BUN 36/64/4034 35* 6 - 23 mg/dL Final  . Creatinine, Ser 04/24/2012 2.50* 0.50 - 1.35 mg/dL Final  . Calcium 74/25/9563 10.1  8.4 - 10.5 mg/dL Final  . GFR calc non Af Amer 04/24/2012 24* >90 mL/min Final  . GFR calc Af Amer 04/24/2012 28* >90 mL/min Final   Comment:                                 The eGFR  has been calculated                          using the CKD EPI equation.                          This calculation has not been                          validated in all clinical                          situations.                          eGFR's persistently                          <90 mL/min signify                          possible Chronic Kidney Disease.  . WBC 04/24/2012 9.3  4.0 - 10.5 K/uL Final  . RBC 04/24/2012 4.11* 4.22 - 5.81 MIL/uL Final  . Hemoglobin 04/24/2012 12.3* 13.0 - 17.0 g/dL Final  . HCT 41/32/4401 36.5* 39.0 - 52.0 % Final  . MCV 04/24/2012 88.8  78.0  - 100.0 fL Final  . MCH 04/24/2012 29.9  26.0 - 34.0 pg Final  . MCHC 04/24/2012 33.7  30.0 - 36.0 g/dL Final  . RDW 02/72/5366 13.9  11.5 - 15.5 % Final  . Platelets 04/24/2012 212  150 - 400 K/uL Final  . Creatinine, Ser 04/24/2012 2.53* 0.50 - 1.35 mg/dL Final  . GFR calc non Af Amer 04/24/2012 24* >90 mL/min Final  . GFR calc Af Amer 04/24/2012 28* >90 mL/min Final   Comment:                                 The eGFR has been calculated                          using the CKD EPI equation.                          This calculation has not been                          validated in all clinical                          situations.                          eGFR's persistently                          <90 mL/min signify                          possible Chronic Kidney Disease.  . Sodium 04/25/2012 140  135 - 145 mEq/L Final  . Potassium 04/25/2012 5.0  3.5 - 5.1 mEq/L Final  . Chloride 04/25/2012 106  96 - 112 mEq/L Final  . CO2 04/25/2012 27  19 - 32 mEq/L Final  . Glucose, Bld 04/25/2012 226* 70 - 99 mg/dL Final  . BUN 16/10/9602 32* 6 - 23 mg/dL Final  . Creatinine, Ser 04/25/2012 2.39* 0.50 - 1.35 mg/dL Final  . Calcium 54/09/8117 8.8  8.4 - 10.5 mg/dL Final  . GFR calc non Af Amer 04/25/2012 25* >90 mL/min Final  . GFR calc Af Amer 04/25/2012 30* >90 mL/min Final   Comment:                                 The eGFR has been calculated                          using the CKD EPI equation.                          This calculation has not been                          validated in all clinical                          situations.                          eGFR's persistently                          <90 mL/min signify                          possible Chronic Kidney Disease.  . WBC 04/25/2012 8.6  4.0 - 10.5 K/uL Final  . RBC 04/25/2012 3.74* 4.22 - 5.81 MIL/uL Final  . Hemoglobin 04/25/2012 11.1* 13.0 - 17.0 g/dL Final  . HCT 14/78/2956 32.8* 39.0 - 52.0 % Final  . MCV  04/25/2012 87.7  78.0 - 100.0 fL Final  . MCH 04/25/2012 29.7  26.0 - 34.0 pg Final  . MCHC 04/25/2012 33.8  30.0 - 36.0 g/dL Final  . RDW 21/30/8657 14.1  11.5 - 15.5 % Final  . Platelets 04/25/2012 198  150 - 400 K/uL Final  . Hemoglobin A1C 04/24/2012 8.7* <5.7 % Final   Comment: (NOTE)                                                                                                                         According to the ADA Clinical Practice Recommendations for 2011, when                          HbA1c is used as a screening test:                           >=  6.5%   Diagnostic of Diabetes Mellitus                                    (if abnormal result is confirmed)                          5.7-6.4%   Increased risk of developing Diabetes Mellitus                          References:Diagnosis and Classification of Diabetes Mellitus,Diabetes                          Care,2011,34(Suppl 1):S62-S69 and Standards of Medical Care in                                  Diabetes - 2011,Diabetes Care,2011,34 (Suppl 1):S11-S61.  . Mean Plasma Glucose 04/24/2012 203* <117 mg/dL Final  . Sodium 16/10/9602 143  135 - 145 mEq/L Final  . Potassium 04/24/2012 5.6* 3.5 - 5.1 mEq/L Final   Comment: DELTA CHECK NOTED                          NO VISIBLE HEMOLYSIS  . Chloride 04/24/2012 107  96 - 112 mEq/L Final  . CO2 04/24/2012 27  19 - 32 mEq/L Final  . Glucose, Bld 04/24/2012 170* 70 - 99 mg/dL Final  . BUN 54/09/8117 34* 6 - 23 mg/dL Final  . Creatinine, Ser 04/24/2012 2.52* 0.50 - 1.35 mg/dL Final  . Calcium 14/78/2956 9.5  8.4 - 10.5 mg/dL Final  . GFR calc non Af Amer 04/24/2012 24* >90 mL/min Final  . GFR calc Af Amer 04/24/2012 28* >90 mL/min Final   Comment:                                 The eGFR has been calculated                          using the CKD EPI equation.                          This calculation has not been                          validated in all clinical                           situations.                          eGFR's persistently                          <90 mL/min signify                          possible Chronic Kidney Disease.  . Glucose-Capillary 04/24/2012 233* 70 - 99 mg/dL Final  . Glucose-Capillary 04/25/2012 182* 70 - 99 mg/dL Final  . Glucose-Capillary  04/25/2012 152* 70 - 99 mg/dL Final  . MRSA by PCR 16/10/9602 NEGATIVE  NEGATIVE Final   Comment:                                 The GeneXpert MRSA Assay (FDA                          approved for NASAL specimens                          only), is one component of a                          comprehensive MRSA colonization                          surveillance program. It is not                          intended to diagnose MRSA                          infection nor to guide or                          monitor treatment for                          MRSA infections.  . Glucose-Capillary 04/25/2012 81  70 - 99 mg/dL Final  . Comment 1 54/09/8117 Notify RN   Final  . Glucose-Capillary 04/25/2012 169* 70 - 99 mg/dL Final  . Comment 1 14/78/2956 Documented in Chart   Final  . Comment 2 04/25/2012 Notify RN   Final  . Sodium 04/26/2012 144  135 - 145 mEq/L Final  . Potassium 04/26/2012 4.3  3.5 - 5.1 mEq/L Final  . Chloride 04/26/2012 109  96 - 112 mEq/L Final  . CO2 04/26/2012 27  19 - 32 mEq/L Final  . Glucose, Bld 04/26/2012 77  70 - 99 mg/dL Final  . BUN 21/30/8657 28* 6 - 23 mg/dL Final  . Creatinine, Ser 04/26/2012 2.42* 0.50 - 1.35 mg/dL Final  . Calcium 84/69/6295 8.7  8.4 - 10.5 mg/dL Final  . GFR calc non Af Amer 04/26/2012 25* >90 mL/min Final  . GFR calc Af Amer 04/26/2012 29* >90 mL/min Final   Comment:                                 The eGFR has been calculated                          using the CKD EPI equation.                          This calculation has not been                          validated in all clinical  situations.                           eGFR's persistently                          <90 mL/min signify                          possible Chronic Kidney Disease.  . Glucose-Capillary 04/25/2012 115* 70 - 99 mg/dL Final  . Glucose-Capillary 04/26/2012 87  70 - 99 mg/dL Final  . Glucose-Capillary 04/26/2012 62* 70 - 99 mg/dL Final       Assessment & Plan:   HTN- bp well controlled. Continue current regimen  Osteomyelitis- presumed, awaiting biopsy result, continue keflex and wound care. Check cbc  Chronic pain- likely phantom pain and currently under control. On oxy IR q4h prn, will change this to once a day prn as pt has not been using any recently. lyrica has been helpful and will continue this  DM- cbg better controlled. Reviewed recet a1c from 4/14. Continue current regimen novolog and levemir until recheck of a1c. Diet modification appreciated and encouraged  Hyperkalemia- on recent hospital admission. Off ACEI. Check bmp today

## 2012-05-24 LAB — BASIC METABOLIC PANEL
Calcium: 9.3 mg/dL (ref 8.6–10.2)
Chloride: 107 mmol/L (ref 97–108)
GFR calc Af Amer: 21 mL/min/{1.73_m2} — ABNORMAL LOW (ref >59)
Glucose: 84 mg/dL (ref 65–99)
Potassium: 5.9 mmol/L — ABNORMAL HIGH (ref 3.5–5.2)

## 2012-05-26 ENCOUNTER — Telehealth: Payer: Self-pay

## 2012-05-26 DIAGNOSIS — E1139 Type 2 diabetes mellitus with other diabetic ophthalmic complication: Secondary | ICD-10-CM | POA: Diagnosis not present

## 2012-05-26 DIAGNOSIS — E11359 Type 2 diabetes mellitus with proliferative diabetic retinopathy without macular edema: Secondary | ICD-10-CM | POA: Diagnosis not present

## 2012-05-26 NOTE — Telephone Encounter (Signed)
Wife called to request that pt's appt. For 06/22/12 be moved to an earlier date.  Reports pt. Had right great toenail removed recently due to a fungal infection, and is concerned about the color of the tissue that was beneath the toenail. Describes as looking a little dark; states "brown".  Also reports that the area has been bleeding.   Advised to report bleeding to the Podiatrist that removed the toenail.  States pt. has appt. on 05/29/12 with Podiatrist.  Enc. to discuss concerns about the right great toe with his podiatrist.  Advised wife that we can look at Dr. Darrick Penna schedule and check for any cancellations, which will enable [place] to move pt's appt. to earlier date.  Verb. understanding.

## 2012-05-31 ENCOUNTER — Telehealth: Payer: Self-pay | Admitting: Vascular Surgery

## 2012-05-31 NOTE — Telephone Encounter (Signed)
Spoke with wife, confirmed appt change - kf

## 2012-06-01 ENCOUNTER — Encounter (INDEPENDENT_AMBULATORY_CARE_PROVIDER_SITE_OTHER): Payer: Medicare Other | Admitting: *Deleted

## 2012-06-01 ENCOUNTER — Other Ambulatory Visit: Payer: Self-pay

## 2012-06-01 ENCOUNTER — Ambulatory Visit (INDEPENDENT_AMBULATORY_CARE_PROVIDER_SITE_OTHER): Payer: Medicare Other | Admitting: Vascular Surgery

## 2012-06-01 ENCOUNTER — Encounter: Payer: Self-pay | Admitting: Vascular Surgery

## 2012-06-01 VITALS — BP 173/65 | HR 71 | Temp 98.2°F | Ht 68.0 in | Wt 209.0 lb

## 2012-06-01 DIAGNOSIS — I739 Peripheral vascular disease, unspecified: Secondary | ICD-10-CM

## 2012-06-01 DIAGNOSIS — L97909 Non-pressure chronic ulcer of unspecified part of unspecified lower leg with unspecified severity: Secondary | ICD-10-CM

## 2012-06-01 DIAGNOSIS — L98499 Non-pressure chronic ulcer of skin of other sites with unspecified severity: Secondary | ICD-10-CM | POA: Diagnosis not present

## 2012-06-01 DIAGNOSIS — I7025 Atherosclerosis of native arteries of other extremities with ulceration: Secondary | ICD-10-CM | POA: Insufficient documentation

## 2012-06-01 NOTE — Progress Notes (Signed)
[facility] [place] HISTORY AND PHYSICAL   History of Present Illness:  Patient is a 72 y.o. year old male who presents for evaluation of a nonhealing right first toe. The patient is seen at the request of Dr. Elvin So.  He stated that the toe was rubbing on his shoe. The wound is now been present for approximately 2 months. He thinks it may be slightly improved. He is currently on Keflex. He denies any pain in the toe. He has previous had a left below-knee amputation. He does have a history of renal insufficiency..  Other medical problems include peripheral arterial disease, neuropathy, hypertension, diabetes, coronary disease. He is on a statin and aspirin.  Past Medical History  Diagnosis Date  . Prostate cancer 2010  . PAD (peripheral artery disease)   . Gangrene of toe     dry  . Neuromuscular disorder     diabetic neuropathy  . Hypertension     takes Amlodipine daily and Metoprolol bid  . Hyperlipidemia     takes Pravastatin daily  . Myocardial infarction 2005  . Peripheral neuropathy   . Constipation     takes Miralax daily  . Hemorrhoids   . Hx of colonic polyps   . History of kidney stones   . Diabetes mellitus     Novolog and Levemir daily  . Coronary artery disease   . BPH (benign prostatic hypertrophy)   . Abnormality of gait   . Coronary atherosclerosis of native coronary artery   . Phlebitis and thrombophlebitis of other deep vessels of lower extremities   . Chronic kidney disease   . Hypertrophy of prostate without urinary obstruction and other lower urinary tract symptoms (LUTS)   . Disorder of bone and cartilage, unspecified   . Lower limb amputation, below knee   . Long term (current) use of anticoagulants     Past Surgical History  Procedure Laterality Date  . Prostate surgery  2009    prostatectomy  . Knee cartilage surgery  at age 74    left knee  . Cea      Right  . Coronary artery bypass graft  2005    x 4  . Carotid  endarterectomy  2005    right  . Cataract surgery  2011    bilateral  . Colonoscopy    . Cardiac catheterization  05/28/11  . Amputation  06/14/2011    Procedure: AMPUTATION DIGIT;  Surgeon: Sherren Kerns, MD;  Location: [facility] OR;  Service: Vascular;  Laterality: Left;  Amputation Left fifth toe  . Amputation  06/16/2011    Procedure: AMPUTATION BELOW KNEE;  Surgeon: Sherren Kerns, MD;  Location: [facility] OR;  Service: Vascular;  Laterality: Left;  . I&d extremity  01/31/2012    Procedure: IRRIGATION AND DEBRIDEMENT EXTREMITY;  Surgeon: Sherren Kerns, MD;  Location: [facility] OR;  Service: Vascular;  Laterality: Left;  I & D Left BKA      Social History History  Substance Use Topics  . Smoking status: Former Smoker -- 1.00 packs/day for 40 years    Types: Cigarettes    Quit date: 04/28/1991  . Smokeless tobacco: Never Used     Comment: occ alcohol  . Alcohol Use: Yes     Comment: occasional    Family History Family History  Problem Relation Age of Onset  . Coronary artery disease Neg Hx   . Anesthesia problems Neg Hx   . Hypotension Neg Hx   .  Malignant hyperthermia Neg Hx   . Pseudochol deficiency Neg Hx   . Hyperlipidemia Mother   . Hypertension Mother   . Cancer Mother   . Hyperlipidemia Father   . Hypertension Father   . Kidney disease Father   . Heart disease Sister   . Alzheimer's disease Sister   . Cancer Brother   . Other Sister     Allergies  No Known Allergies   Current Outpatient Prescriptions  Medication Sig Dispense Refill  . amLODipine (NORVASC) 10 MG tablet Take 1 tablet (10 mg total) by mouth every morning.  30 tablet  5  . aspirin EC 81 MG tablet Take 81 mg by mouth every morning.       . cephALEXin (KEFLEX) 500 MG capsule Take two tablets by mouth twice daily for infection      . docusate sodium (COLACE) 100 MG capsule Take 100 mg by mouth 2 (two) times daily as needed for constipation.      [name] doxazosin (CARDURA) 1 MG tablet Take 0.5 mg by mouth at  bedtime. Take 1/2 tablet by mouth at bedtime for blood pressure      . insulin aspart (NOVOLOG FLEXPEN) 100 UNIT/ML injection Inject 0-5 Units into the skin 3 (three) times daily before meals. Sliding scale 0 units=below 150 anything greater 5 units      . insulin detemir (LEVEMIR FLEXPEN) 100 UNIT/ML injection Inject 18 Units into the skin at bedtime.      . isosorbide-hydrALAZINE (BIDIL) 20-37.5 MG per tablet Take 1 tablet by mouth 2 (two) times daily.  60 tablet  0  . oxyCODONE (OXY IR/ROXICODONE) 5 MG immediate release tablet Take one tablet every 12 hours as needed for pain  60 tablet  0  . polyethylene glycol (MIRALAX / GLYCOLAX) packet Take 17 g by mouth daily.      . pravastatin (PRAVACHOL) 80 MG tablet Take 40 mg by mouth every morning.      . pregabalin (LYRICA) 200 MG capsule Take 200 mg by mouth 2 (two) times daily.       No current facility-administered medications for this visit.    ROS:   General:  No weight loss, Fever, chills  HEENT: No recent headaches, no nasal bleeding, no visual changes, no sore throat  Neurologic: No dizziness, blackouts, seizures. No recent symptoms of stroke or mini- stroke. No recent episodes of slurred speech, or temporary blindness.  Cardiac: No recent episodes of chest pain/pressure, no shortness of breath at rest.  No shortness of breath with exertion.  Denies history of atrial fibrillation or irregular heartbeat  Vascular: No history of rest pain in feet.  No history of claudication.  + history of non-healing ulcer, No history of DVT   Pulmonary: No home oxygen, no productive cough, no hemoptysis,  No asthma or wheezing  Musculoskeletal:  [ ]  Arthritis, [ ]  Low back pain,  [ ]  Joint pain  Hematologic:No history of hypercoagulable state.  No history of easy bleeding.  No history of anemia  Gastrointestinal: No hematochezia or melena,  No gastroesophageal reflux, no trouble swallowing  Urinary: [x ] chronic Kidney disease, [ ]  on HD - [ ]   MWF or [ ]  TTHS, [ ]  Burning with urination, [ ]  Frequent urination, [ ]  Difficulty urinating;   Skin: No rashes  Psychological: No history of anxiety,  No history of depression   Physical Examination  Filed Vitals:   06/01/12 1441  BP: 173/65  Pulse: 71  Temp: 98.2  F (36.8 C)  TempSrc: Oral  Height: 5\' 8"  (1.727 m)  Weight: 209 lb (94.802 kg)  SpO2: 100%    Body mass index is 31.79 kg/(m^2).  General:  Alert and oriented, no acute distress HEENT: Normal Neck: No bruit or JVD Pulmonary: Clear to auscultation bilaterally Cardiac: Regular Rate and Rhythm without murmur Abdomen: Soft, non-tender, non-distended, no mass Skin: No rash, gangrene nail bed of right first toe no erythema no discharge Extremity Pulses:  2+ radial, brachial, femoral, dorsalis pedis, posterior tibial pulses bilaterally Musculoskeletal: No deformity or edema, left below-knee amputation  Neurologic: Upper and lower extremity motor 5/5 and symmetric  DATA: The patient had arterial duplex exam today which are reviewed and interpreted. She suggested some mild superficial femoral occlusive disease as well as tibial artery occlusive disease ABI was 0.52 monophasic waveforms in the DP   ASSESSMENT: Severe peripheral arterial disease limb threatening ischemia   PLAN: Aortogram with lower assuring a runoff possible intervention tomorrow. The patient will be brought to the hospital early for hydration to try to avoid contrast nephropathy. Risks benefits possible complications and procedure details of arteriography intervention was explained to the patient and his wife today. They understand and agree to proceed.  Fabienne Bruns, MD [facility] [place] Office: [phone]     For VQI Use Only    PRE-ADM LIVING: Home  AMB STATUS: Ambulatory  CAD Sx: None  PRIOR CHF: Mild  STRESS TEST: [x ] No, [ ]  Normal, [ ]  + ischemia, [ ]  + MI, [ ]  Both   Pager: [phone]

## 2012-06-02 ENCOUNTER — Ambulatory Visit (HOSPITAL_COMMUNITY)
Admission: RE | Admit: 2012-06-02 | Discharge: 2012-06-02 | Disposition: A | Discharge status: Home / Self Care | Payer: Medicare Other | Source: Ambulatory Visit | Attending: Vascular Surgery | Admitting: Vascular Surgery

## 2012-06-02 ENCOUNTER — Encounter (HOSPITAL_COMMUNITY)
Admission: RE | Disposition: A | Discharge status: Home / Self Care | Payer: Self-pay | Source: Ambulatory Visit | Attending: Vascular Surgery

## 2012-06-02 ENCOUNTER — Other Ambulatory Visit: Payer: Self-pay | Admitting: *Deleted

## 2012-06-02 DIAGNOSIS — I70269 Atherosclerosis of native arteries of extremities with gangrene, unspecified extremity: Secondary | ICD-10-CM | POA: Diagnosis not present

## 2012-06-02 DIAGNOSIS — Z86718 Personal history of other venous thrombosis and embolism: Secondary | ICD-10-CM | POA: Diagnosis not present

## 2012-06-02 DIAGNOSIS — Z794 Long term (current) use of insulin: Secondary | ICD-10-CM | POA: Insufficient documentation

## 2012-06-02 DIAGNOSIS — Z8249 Family history of ischemic heart disease and other diseases of the circulatory system: Secondary | ICD-10-CM | POA: Insufficient documentation

## 2012-06-02 DIAGNOSIS — S88119A Complete traumatic amputation at level between knee and ankle, unspecified lower leg, initial encounter: Secondary | ICD-10-CM | POA: Diagnosis not present

## 2012-06-02 DIAGNOSIS — N289 Disorder of kidney and ureter, unspecified: Secondary | ICD-10-CM | POA: Insufficient documentation

## 2012-06-02 DIAGNOSIS — Z7901 Long term (current) use of anticoagulants: Secondary | ICD-10-CM | POA: Diagnosis not present

## 2012-06-02 DIAGNOSIS — I2510 Atherosclerotic heart disease of native coronary artery without angina pectoris: Secondary | ICD-10-CM | POA: Diagnosis not present

## 2012-06-02 DIAGNOSIS — Z79899 Other long term (current) drug therapy: Secondary | ICD-10-CM | POA: Insufficient documentation

## 2012-06-02 DIAGNOSIS — E1159 Type 2 diabetes mellitus with other circulatory complications: Secondary | ICD-10-CM | POA: Insufficient documentation

## 2012-06-02 DIAGNOSIS — I10 Essential (primary) hypertension: Secondary | ICD-10-CM | POA: Diagnosis not present

## 2012-06-02 DIAGNOSIS — N400 Enlarged prostate without lower urinary tract symptoms: Secondary | ICD-10-CM | POA: Diagnosis not present

## 2012-06-02 DIAGNOSIS — E1142 Type 2 diabetes mellitus with diabetic polyneuropathy: Secondary | ICD-10-CM | POA: Insufficient documentation

## 2012-06-02 DIAGNOSIS — I252 Old myocardial infarction: Secondary | ICD-10-CM | POA: Insufficient documentation

## 2012-06-02 DIAGNOSIS — I7092 Chronic total occlusion of artery of the extremities: Secondary | ICD-10-CM | POA: Diagnosis not present

## 2012-06-02 DIAGNOSIS — E1149 Type 2 diabetes mellitus with other diabetic neurological complication: Secondary | ICD-10-CM | POA: Diagnosis not present

## 2012-06-02 DIAGNOSIS — Z87891 Personal history of nicotine dependence: Secondary | ICD-10-CM | POA: Diagnosis not present

## 2012-06-02 DIAGNOSIS — M899 Disorder of bone, unspecified: Secondary | ICD-10-CM | POA: Diagnosis not present

## 2012-06-02 DIAGNOSIS — L97509 Non-pressure chronic ulcer of other part of unspecified foot with unspecified severity: Secondary | ICD-10-CM | POA: Insufficient documentation

## 2012-06-02 HISTORY — PX: ABDOMINAL AORTAGRAM: SHX5454

## 2012-06-02 LAB — POCT I-STAT, CHEM 8
BUN: 56 mg/dL — ABNORMAL HIGH (ref 6–23)
Calcium, Ion: 1.23 mmol/L (ref 1.13–1.30)
Glucose, Bld: 215 mg/dL — ABNORMAL HIGH (ref 70–99)
TCO2: 23 mmol/L (ref 0–100)

## 2012-06-02 LAB — GLUCOSE, CAPILLARY: Glucose-Capillary: 150 mg/dL — ABNORMAL HIGH (ref 70–99)

## 2012-06-02 SURGERY — ABDOMINAL AORTAGRAM
Anesthesia: LOCAL | Laterality: Right

## 2012-06-02 MED ORDER — SODIUM CHLORIDE 0.45 % IV SOLN
INTRAVENOUS | Status: DC
  Administered 2012-06-02: 13:00:00 via INTRAVENOUS

## 2012-06-02 MED ORDER — SODIUM CHLORIDE 0.9 % IV SOLN
INTRAVENOUS | Status: DC
  Administered 2012-06-02: 07:00:00 via INTRAVENOUS

## 2012-06-02 MED ORDER — MORPHINE SULFATE 10 MG/ML IJ SOLN: 2.0000 mg | INTRAMUSCULAR | Status: DC | PRN

## 2012-06-02 MED ORDER — ACETAMINOPHEN 325 MG RE SUPP: 325.0000 mg | RECTAL | Status: DC | PRN

## 2012-06-02 MED ORDER — ACETAMINOPHEN 325 MG PO TABS: 325.0000 mg | ORAL_TABLET | ORAL | Status: DC | PRN

## 2012-06-02 MED ORDER — LABETALOL HCL 5 MG/ML IV SOLN: 10.0000 mg | INTRAVENOUS | Status: DC | PRN

## 2012-06-02 MED ORDER — HYDRALAZINE HCL 20 MG/ML IJ SOLN: 10.0000 mg | INTRAMUSCULAR | Status: DC | PRN

## 2012-06-02 MED ORDER — ONDANSETRON HCL 4 MG/2ML IJ SOLN: 4.0000 mg | Freq: Four times a day (QID) | INTRAMUSCULAR | Status: DC | PRN

## 2012-06-02 MED ORDER — METOPROLOL TARTRATE 1 MG/ML IV SOLN: 2.0000 mg | INTRAVENOUS | Status: DC | PRN

## 2012-06-02 NOTE — Interval H&P Note (Signed)
History and Physical Interval Note:  06/02/2012 9:38 AM  Aaron Long  has presented today for surgery, with the diagnosis of PVD  The various methods of treatment have been discussed with the patient and family. After consideration of risks, benefits and other options for treatment, the patient has consented to  Procedure(s): ABDOMINAL AORTAGRAM (N/A) as a surgical intervention .  [name] history has been reviewed, patient examined, no change in status, stable for surgery.  I have reviewed the patient's chart and labs.  Questions were answered to the patient's satisfaction.     FIELDS,CHARLES E

## 2012-06-02 NOTE — Op Note (Signed)
     [facility]           OF [place]   Procedure: Aortogram with bilateral lower extremity runoff  Preoperative diagnosis: Gangrene right first toe  Postoperative diagnosis: Same  Anesthesia Local  Operative details: After obtaining informed consent, the patient was taken to the PV LAB. The patient was placed in supine position on the Angio table. Both groins were prepped and draped in usual sterile fashion. Local anesthesia was infiltrated over the left common femoral artery. Initially, ultrasound was used to identify the common femoral artery.    An introducer needle was used to cannulate the left common femoral artery using ultrasound guidance and 035 versacore wire threaded into the abdominal aorta under fluoroscopic guidance. Next a 5 French sheath is placed over the guidewire in the left common femoral artery. A 5 French pigtail catheter was placed over the guidewire into the abdominal aorta and abdominal aortogram was obtained using carbon dioxide contrast. The infrarenal aorta is patent. The left and right common and external iliac arteries are patent. The renal arteries are poorly visualized. The internal iliac arteries are not visualized.   In order to get increased opacification of the tibials a 5 Fr crossover catheter was brought up on the field.  The crossover catheter was used to selectively catheterize the right common iliac artery and the guidewire advanced into the right distal external iliac artery. The crossover catheter was removed and replaced with a 5 French straight catheter. Angiogram was then performed the right lower extremity again utilizing carbon dioxide contrast.  The right common femoral artery is patent. The profunda femoris artery is patent. The right superficial femoral artery irregular on its surface but is patent. The right popliteal artery is irregular on its surface but patent.  Attempt was made to visualize the tibial vessels using carbon  dioxide contrast but there was a very dilute opacification. At this point a 035 angled Glidewire was brought up in the operative field and this was advanced down into the right superficial femoral artery. The 5 French straight catheter was advanced over this into the right superficial femoral artery. Contrast was then used to obtain images of the tibial vessels. The right popliteal artery is patent. There is a high-grade stenosis 80% or greater of the mid tibia peroneal trunk. The anterior tibial artery is occluded. The posterior tibial artery is occluded. The peroneal artery is patent throughout it's course is of anterior posterior indicating branches at the level of the ankle. There is one stenosis of proximal and 70% in the proximal third of the peroneal. There are several areas of stenosis throughout the course of the peroneal but none of these other stenoses are necessarily high-grade. Next the 5 French straight catheter was removed over a guidewire.    The 5 Fr sheath was left in place to be pulled in the holding area. The patient tolerated the procedure well and there were no complications. Patient was taken to the holding area in stable condition.  Operative findings:  Severe tibial artery occlusive disease  Operative management: Consideration will be given for bringing the patient back to the PV lab for angioplasty of the tibial peroneal trunk and possibly proximal third of the peroneal artery. This will be staged to minimize contrast exposure.  Fabienne Bruns, MD [facility] [place] Office: [phone] Pager: [phone]

## 2012-06-02 NOTE — H&P (View-Only) (Signed)
[facility] OF [place] HISTORY AND PHYSICAL   History of Present Illness:  Patient is a 71 y.o. year old male who presents for evaluation of a nonhealing right first toe. The patient is seen at the request of Dr. Petery.  He stated that the toe was rubbing on his shoe. The wound is now been present for approximately 2 months. He thinks it may be slightly improved. He is currently on Keflex. He denies any pain in the toe. He has previous had a left below-knee amputation. He does have a history of renal insufficiency..  Other medical problems include peripheral arterial disease, neuropathy, hypertension, diabetes, coronary disease. He is on a statin and aspirin.  Past Medical History  Diagnosis Date  . Prostate cancer 2010  . PAD (peripheral artery disease)   . Gangrene of toe     dry  . Neuromuscular disorder     diabetic neuropathy  . Hypertension     takes Amlodipine daily and Metoprolol bid  . Hyperlipidemia     takes Pravastatin daily  . Myocardial infarction 2005  . Peripheral neuropathy   . Constipation     takes Miralax daily  . Hemorrhoids   . Hx of colonic polyps   . History of kidney stones   . Diabetes mellitus     Novolog and Levemir daily  . Coronary artery disease   . BPH (benign prostatic hypertrophy)   . Abnormality of gait   . Coronary atherosclerosis of native coronary artery   . Phlebitis and thrombophlebitis of other deep vessels of lower extremities   . Chronic kidney disease   . Hypertrophy of prostate without urinary obstruction and other lower urinary tract symptoms (LUTS)   . Disorder of bone and cartilage, unspecified   . Lower limb amputation, below knee   . Long term (current) use of anticoagulants     Past Surgical History  Procedure Laterality Date  . Prostate surgery  2009    prostatectomy  . Knee cartilage surgery  at age 21    left knee  . Cea      Right  . Coronary artery bypass graft  2005    x 4  . Carotid  endarterectomy  2005    right  . Cataract surgery  2011    bilateral  . Colonoscopy    . Cardiac catheterization  05/28/11  . Amputation  06/14/2011    Procedure: AMPUTATION DIGIT;  Surgeon: Jaquitta Dupriest E Matha Masse, MD;  Location: [facility];  Service: Vascular;  Laterality: Left;  Amputation Left fifth toe  . Amputation  06/16/2011    Procedure: AMPUTATION BELOW KNEE;  Surgeon: Cortney Beissel E Diann Bangerter, MD;  Location: [facility] OR;  Service: Vascular;  Laterality: Left;  . I&d extremity  01/31/2012    Procedure: IRRIGATION AND DEBRIDEMENT EXTREMITY;  Surgeon: Jacen Carlini E Brihany Butch, MD;  Location: [facility] OR;  Service: Vascular;  Laterality: Left;  I & D Left BKA      Social History History  Substance Use Topics  . Smoking status: Former Smoker -- 1.00 packs/day for 40 years    Types: Cigarettes    Quit date: 04/28/1991  . Smokeless tobacco: Never Used     Comment: occ alcohol  . Alcohol Use: Yes     Comment: occasional    Family History Family History  Problem Relation Age of Onset  . Coronary artery disease Neg Hx   . Anesthesia problems Neg Hx   . Hypotension Neg Hx   .   Malignant hyperthermia Neg Hx   . Pseudochol deficiency Neg Hx   . Hyperlipidemia Mother   . Hypertension Mother   . Cancer Mother   . Hyperlipidemia Father   . Hypertension Father   . Kidney disease Father   . Heart disease Sister   . Alzheimer's disease Sister   . Cancer Brother   . Other Sister     Allergies  No Known Allergies   Current Outpatient Prescriptions  Medication Sig Dispense Refill  . amLODipine (NORVASC) 10 MG tablet Take 1 tablet (10 mg total) by mouth every morning.  30 tablet  5  . aspirin EC 81 MG tablet Take 81 mg by mouth every morning.       . cephALEXin (KEFLEX) 500 MG capsule Take two tablets by mouth twice daily for infection      . docusate sodium (COLACE) 100 MG capsule Take 100 mg by mouth 2 (two) times daily as needed for constipation.      . doxazosin (CARDURA) 1 MG tablet Take 0.5 mg by mouth at  bedtime. Take 1/2 tablet by mouth at bedtime for blood pressure      . insulin aspart (NOVOLOG FLEXPEN) 100 UNIT/ML injection Inject 0-5 Units into the skin 3 (three) times daily before meals. Sliding scale 0 units=below 150 anything greater 5 units      . insulin detemir (LEVEMIR FLEXPEN) 100 UNIT/ML injection Inject 18 Units into the skin at bedtime.      . isosorbide-hydrALAZINE (BIDIL) 20-37.5 MG per tablet Take 1 tablet by mouth 2 (two) times daily.  60 tablet  0  . oxyCODONE (OXY IR/ROXICODONE) 5 MG immediate release tablet Take one tablet every 12 hours as needed for pain  60 tablet  0  . polyethylene glycol (MIRALAX / GLYCOLAX) packet Take 17 g by mouth daily.      . pravastatin (PRAVACHOL) 80 MG tablet Take 40 mg by mouth every morning.      . pregabalin (LYRICA) 200 MG capsule Take 200 mg by mouth 2 (two) times daily.       No current facility-administered medications for this visit.    ROS:   General:  No weight loss, Fever, chills  HEENT: No recent headaches, no nasal bleeding, no visual changes, no sore throat  Neurologic: No dizziness, blackouts, seizures. No recent symptoms of stroke or mini- stroke. No recent episodes of slurred speech, or temporary blindness.  Cardiac: No recent episodes of chest pain/pressure, no shortness of breath at rest.  No shortness of breath with exertion.  Denies history of atrial fibrillation or irregular heartbeat  Vascular: No history of rest pain in feet.  No history of claudication.  + history of non-healing ulcer, No history of DVT   Pulmonary: No home oxygen, no productive cough, no hemoptysis,  No asthma or wheezing  Musculoskeletal:  [ ] Arthritis, [ ] Low back pain,  [ ] Joint pain  Hematologic:No history of hypercoagulable state.  No history of easy bleeding.  No history of anemia  Gastrointestinal: No hematochezia or melena,  No gastroesophageal reflux, no trouble swallowing  Urinary: [x ] chronic Kidney disease, [ ] on HD - [ ]  MWF or [ ] TTHS, [ ] Burning with urination, [ ] Frequent urination, [ ] Difficulty urinating;   Skin: No rashes  Psychological: No history of anxiety,  No history of depression   Physical Examination  Filed Vitals:   06/01/12 1441  BP: 173/65  Pulse: 71  Temp: 98.2   F (36.8 C)  TempSrc: Oral  Height: 5' 8" (1.727 m)  Weight: 209 lb (94.802 kg)  SpO2: 100%    Body mass index is 31.79 kg/(m^2).  General:  Alert and oriented, no acute distress HEENT: Normal Neck: No bruit or JVD Pulmonary: Clear to auscultation bilaterally Cardiac: Regular Rate and Rhythm without murmur Abdomen: Soft, non-tender, non-distended, no mass Skin: No rash, gangrene nail bed of right first toe no erythema no discharge Extremity Pulses:  2+ radial, brachial, femoral, dorsalis pedis, posterior tibial pulses bilaterally Musculoskeletal: No deformity or edema, left below-knee amputation  Neurologic: Upper and lower extremity motor 5/5 and symmetric  DATA: The patient had arterial duplex exam today which are reviewed and interpreted. She suggested some mild superficial femoral occlusive disease as well as tibial artery occlusive disease ABI was 0.52 monophasic waveforms in the DP   ASSESSMENT: Severe peripheral arterial disease limb threatening ischemia   PLAN: Aortogram with lower assuring a runoff possible intervention tomorrow. The patient will be brought to the hospital early for hydration to try to avoid contrast nephropathy. Risks benefits possible complications and procedure details of arteriography intervention was explained to the patient and his wife today. They understand and agree to proceed.  Ayiana Winslett, MD [facility] [place] Office: [phone]     For VQI Use Only    PRE-ADM LIVING: Home  AMB STATUS: Ambulatory  CAD Sx: None  PRIOR CHF: Mild  STRESS TEST: [x ] No, [ ] Normal, [ ] + ischemia, [ ] + MI, [ ] Both   Pager: [phone]  

## 2012-06-06 ENCOUNTER — Encounter (HOSPITAL_COMMUNITY): Payer: Self-pay | Admitting: Pharmacy Technician

## 2012-06-09 ENCOUNTER — Ambulatory Visit (HOSPITAL_COMMUNITY)
Admission: RE | Admit: 2012-06-09 | Discharge: 2012-06-09 | Disposition: A | Discharge status: Home / Self Care | Payer: Medicare Other | Source: Ambulatory Visit | Attending: Vascular Surgery | Admitting: Vascular Surgery

## 2012-06-09 ENCOUNTER — Other Ambulatory Visit: Payer: Self-pay | Admitting: *Deleted

## 2012-06-09 ENCOUNTER — Encounter (HOSPITAL_COMMUNITY)
Admission: RE | Disposition: A | Discharge status: Home / Self Care | Payer: Self-pay | Source: Ambulatory Visit | Attending: Vascular Surgery

## 2012-06-09 ENCOUNTER — Encounter (HOSPITAL_COMMUNITY): Payer: Self-pay | Admitting: Pharmacy Technician

## 2012-06-09 DIAGNOSIS — Z8546 Personal history of malignant neoplasm of prostate: Secondary | ICD-10-CM | POA: Insufficient documentation

## 2012-06-09 DIAGNOSIS — Z9079 Acquired absence of other genital organ(s): Secondary | ICD-10-CM | POA: Insufficient documentation

## 2012-06-09 DIAGNOSIS — M899 Disorder of bone, unspecified: Secondary | ICD-10-CM | POA: Insufficient documentation

## 2012-06-09 DIAGNOSIS — Z7982 Long term (current) use of aspirin: Secondary | ICD-10-CM | POA: Insufficient documentation

## 2012-06-09 DIAGNOSIS — I70269 Atherosclerosis of native arteries of extremities with gangrene, unspecified extremity: Secondary | ICD-10-CM | POA: Insufficient documentation

## 2012-06-09 DIAGNOSIS — S88119A Complete traumatic amputation at level between knee and ankle, unspecified lower leg, initial encounter: Secondary | ICD-10-CM | POA: Diagnosis not present

## 2012-06-09 DIAGNOSIS — E1149 Type 2 diabetes mellitus with other diabetic neurological complication: Secondary | ICD-10-CM | POA: Insufficient documentation

## 2012-06-09 DIAGNOSIS — M949 Disorder of cartilage, unspecified: Secondary | ICD-10-CM | POA: Insufficient documentation

## 2012-06-09 DIAGNOSIS — Z794 Long term (current) use of insulin: Secondary | ICD-10-CM | POA: Diagnosis not present

## 2012-06-09 DIAGNOSIS — E785 Hyperlipidemia, unspecified: Secondary | ICD-10-CM | POA: Insufficient documentation

## 2012-06-09 DIAGNOSIS — E1159 Type 2 diabetes mellitus with other circulatory complications: Secondary | ICD-10-CM | POA: Diagnosis not present

## 2012-06-09 DIAGNOSIS — Z7901 Long term (current) use of anticoagulants: Secondary | ICD-10-CM | POA: Insufficient documentation

## 2012-06-09 DIAGNOSIS — I252 Old myocardial infarction: Secondary | ICD-10-CM | POA: Diagnosis not present

## 2012-06-09 DIAGNOSIS — N189 Chronic kidney disease, unspecified: Secondary | ICD-10-CM | POA: Diagnosis not present

## 2012-06-09 DIAGNOSIS — L97509 Non-pressure chronic ulcer of other part of unspecified foot with unspecified severity: Secondary | ICD-10-CM | POA: Insufficient documentation

## 2012-06-09 DIAGNOSIS — E1142 Type 2 diabetes mellitus with diabetic polyneuropathy: Secondary | ICD-10-CM | POA: Diagnosis not present

## 2012-06-09 DIAGNOSIS — Z79899 Other long term (current) drug therapy: Secondary | ICD-10-CM | POA: Insufficient documentation

## 2012-06-09 DIAGNOSIS — I129 Hypertensive chronic kidney disease with stage 1 through stage 4 chronic kidney disease, or unspecified chronic kidney disease: Secondary | ICD-10-CM | POA: Diagnosis not present

## 2012-06-09 DIAGNOSIS — I2510 Atherosclerotic heart disease of native coronary artery without angina pectoris: Secondary | ICD-10-CM | POA: Diagnosis not present

## 2012-06-09 DIAGNOSIS — Z951 Presence of aortocoronary bypass graft: Secondary | ICD-10-CM | POA: Diagnosis not present

## 2012-06-09 HISTORY — PX: ABDOMINAL AORTAGRAM: SHX5454

## 2012-06-09 LAB — POCT I-STAT, CHEM 8
BUN: 38 mg/dL — ABNORMAL HIGH (ref 6–23)
Creatinine, Ser: 2.3 mg/dL — ABNORMAL HIGH (ref 0.50–1.35)
Glucose, Bld: 137 mg/dL — ABNORMAL HIGH (ref 70–99)
Hemoglobin: 12.2 g/dL — ABNORMAL LOW (ref 13.0–17.0)
Potassium: 5.3 mEq/L — ABNORMAL HIGH (ref 3.5–5.1)

## 2012-06-09 LAB — POCT ACTIVATED CLOTTING TIME
Activated Clotting Time: 236 seconds
Activated Clotting Time: 258 seconds
Activated Clotting Time: 182 seconds
Activated Clotting Time: 192 seconds

## 2012-06-09 LAB — COMPREHENSIVE METABOLIC PANEL
AST: 18 U/L (ref 0–37)
Albumin: 2.9 g/dL — ABNORMAL LOW (ref 3.5–5.2)
Calcium: 8.7 mg/dL (ref 8.4–10.5)
Creatinine, Ser: 2.23 mg/dL — ABNORMAL HIGH (ref 0.50–1.35)

## 2012-06-09 LAB — CBC
MCH: 29.4 pg (ref 26.0–34.0)
MCHC: 32.9 g/dL (ref 30.0–36.0)
MCV: 89.5 fL (ref 78.0–100.0)
Platelets: 224 10*3/uL (ref 150–400)
RDW: 14 % (ref 11.5–15.5)
WBC: 8.3 10*3/uL (ref 4.0–10.5)

## 2012-06-09 LAB — SURGICAL PCR SCREEN: Staphylococcus aureus: NEGATIVE

## 2012-06-09 SURGERY — ABDOMINAL AORTAGRAM
Anesthesia: LOCAL

## 2012-06-09 MED ORDER — MORPHINE SULFATE 10 MG/ML IJ SOLN: 2.0000 mg | INTRAMUSCULAR | Status: DC | PRN

## 2012-06-09 MED ORDER — ACETAMINOPHEN 325 MG PO TABS: 325.0000 mg | ORAL_TABLET | ORAL | Status: DC | PRN

## 2012-06-09 MED ORDER — ACETAMINOPHEN 325 MG RE SUPP: 325.0000 mg | RECTAL | Status: DC | PRN

## 2012-06-09 MED ORDER — MUPIROCIN 2 % EX OINT
TOPICAL_OINTMENT | CUTANEOUS | Status: AC
  Filled 2012-06-09: qty 22

## 2012-06-09 MED ORDER — CLOPIDOGREL BISULFATE 300 MG PO TABS
ORAL_TABLET | ORAL | Status: AC
  Filled 2012-06-09: qty 1

## 2012-06-09 MED ORDER — DEXTROSE 5 % IV SOLN
1.5000 g | INTRAVENOUS | Status: DC
  Filled 2012-06-09: qty 1.5

## 2012-06-09 MED ORDER — LIDOCAINE HCL (PF) 1 % IJ SOLN
INTRAMUSCULAR | Status: AC
  Filled 2012-06-09: qty 30

## 2012-06-09 MED ORDER — SODIUM CHLORIDE 0.9 % IV SOLN
INTRAVENOUS | Status: DC
  Administered 2012-06-09: 07:00:00 via INTRAVENOUS

## 2012-06-09 MED ORDER — METOPROLOL TARTRATE 1 MG/ML IV SOLN: 2.0000 mg | INTRAVENOUS | Status: DC | PRN

## 2012-06-09 MED ORDER — HEPARIN SODIUM (PORCINE) 1000 UNIT/ML IJ SOLN
INTRAMUSCULAR | Status: AC
  Filled 2012-06-09: qty 1

## 2012-06-09 MED ORDER — CLOPIDOGREL BISULFATE 300 MG PO TABS: 300.0000 mg | ORAL_TABLET | Freq: Once | ORAL | Status: DC

## 2012-06-09 MED ORDER — SODIUM CHLORIDE 0.9 % IV BOLUS (SEPSIS): 1000.0000 mL | Freq: Once | INTRAVENOUS | Status: DC

## 2012-06-09 MED ORDER — CLOPIDOGREL BISULFATE 75 MG PO TABS: 75.0000 mg | ORAL_TABLET | Freq: Every day | ORAL | Status: DC

## 2012-06-09 MED ORDER — HYDRALAZINE HCL 20 MG/ML IJ SOLN
10.0000 mg | INTRAMUSCULAR | Status: DC | PRN
  Administered 2012-06-09: 10 mg via INTRAVENOUS

## 2012-06-09 MED ORDER — LABETALOL HCL 5 MG/ML IV SOLN: 10.0000 mg | INTRAVENOUS | Status: DC | PRN

## 2012-06-09 MED ORDER — SODIUM CHLORIDE 0.45 % IV SOLN
INTRAVENOUS | Status: DC
  Administered 2012-06-09: 10:00:00 via INTRAVENOUS

## 2012-06-09 MED ORDER — ONDANSETRON HCL 4 MG/2ML IJ SOLN: 4.0000 mg | Freq: Four times a day (QID) | INTRAMUSCULAR | Status: DC | PRN

## 2012-06-09 MED ORDER — HYDRALAZINE HCL 20 MG/ML IJ SOLN
INTRAMUSCULAR | Status: AC
  Filled 2012-06-09: qty 1

## 2012-06-09 NOTE — Pre-Procedure Instructions (Signed)
PANTELIS ELGERSMA  06/09/2012   Your procedure is scheduled on:  06/12/12  Report to [facility] at 730 AM.  Call this number if you have problems the morning of surgery: [phone]   Remember:   Do not eat food or drink liquids after midnight.   Take these medicines the morning of surgery with A SIP OF WATER: norvasc,cardura,oxycontin,   Do not wear jewelry, make-up or nail polish.  Do not wear lotions, powders, or perfumes. You may wear deodorant.  Do not shave 48 hours prior to surgery. Men may shave face and neck.  Do not bring valuables to the hospital.  [facility] is not responsible                   for any belongings or valuables.  Contacts, dentures or bridgework may not be worn into surgery.  Leave suitcase in the car. After surgery it may be brought to your room.  For patients admitted to the hospital, checkout time is 11:00 AM the day of  discharge.   Patients discharged the day of surgery will not be allowed to drive  home.  Name and phone number of your driver: family  Special Instructions: Shower using CHG 2 nights before surgery and the night before surgery.  If you shower the day of surgery use CHG.  Use special wash - you have one bottle of CHG for all showers.  You should use approximately 1/3 of the bottle for each shower.   Please read over the following fact sheets that you were given: Pain Booklet, Coughing and Deep Breathing, MRSA Information and Surgical Site Infection Prevention

## 2012-06-09 NOTE — Interval H&P Note (Signed)
History and Physical Interval Note:  06/09/2012 8:01 AM  Aaron Long  has presented today for surgery, with the diagnosis of PVD  The various methods of treatment have been discussed with the patient and family. After consideration of risks, benefits and other options for treatment, the patient has consented to  Procedure(s): ABDOMINAL AORTAGRAM (N/A) as a surgical intervention .  [name] history has been reviewed, patient examined, no change in status, stable for surgery.  I have reviewed the patient's chart and labs.  Questions were answered to the patient's satisfaction.    High risk due to renal dysfunction.  Discussed with pt and family.  Plan is for intervention in popliteal TPT possibly peroneal   Aaron Long

## 2012-06-09 NOTE — H&P (View-Only) (Signed)
[facility] OF [place] HISTORY AND PHYSICAL   History of Present Illness:  Patient is a 72 y.o. year old male who presents for evaluation of a nonhealing right first toe. The patient is seen at the request of Dr. Petery.  He stated that the toe was rubbing on his shoe. The wound is now been present for approximately 2 months. He thinks it may be slightly improved. He is currently on Keflex. He denies any pain in the toe. He has previous had a left below-knee amputation. He does have a history of renal insufficiency..  Other medical problems include peripheral arterial disease, neuropathy, hypertension, diabetes, coronary disease. He is on a statin and aspirin.  Past Medical History  Diagnosis Date  . Prostate cancer 2010  . PAD (peripheral artery disease)   . Gangrene of toe     dry  . Neuromuscular disorder     diabetic neuropathy  . Hypertension     takes Amlodipine daily and Metoprolol bid  . Hyperlipidemia     takes Pravastatin daily  . Myocardial infarction 2005  . Peripheral neuropathy   . Constipation     takes Miralax daily  . Hemorrhoids   . Hx of colonic polyps   . History of kidney stones   . Diabetes mellitus     Novolog and Levemir daily  . Coronary artery disease   . BPH (benign prostatic hypertrophy)   . Abnormality of gait   . Coronary atherosclerosis of native coronary artery   . Phlebitis and thrombophlebitis of other deep vessels of lower extremities   . Chronic kidney disease   . Hypertrophy of prostate without urinary obstruction and other lower urinary tract symptoms (LUTS)   . Disorder of bone and cartilage, unspecified   . Lower limb amputation, below knee   . Long term (current) use of anticoagulants     Past Surgical History  Procedure Laterality Date  . Prostate surgery  2009    prostatectomy  . Knee cartilage surgery  at age 21    left knee  . Cea      Right  . Coronary artery bypass graft  2005    x 4  . Carotid  endarterectomy  2005    right  . Cataract surgery  2011    bilateral  . Colonoscopy    . Cardiac catheterization  05/28/11  . Amputation  06/14/2011    Procedure: AMPUTATION DIGIT;  Surgeon: Luara Faye E Jovie Swanner, MD;  Location: [facility] OR;  Service: Vascular;  Laterality: Left;  Amputation Left fifth toe  . Amputation  06/16/2011    Procedure: AMPUTATION BELOW KNEE;  Surgeon: Shakerria Parran E Shantaya Bluestone, MD;  Location: [facility];  Service: Vascular;  Laterality: Left;  . I&d extremity  01/31/2012    Procedure: IRRIGATION AND DEBRIDEMENT EXTREMITY;  Surgeon: Hartford Maulden E Davis Vannatter, MD;  Location: [facility] OR;  Service: Vascular;  Laterality: Left;  I & D Left BKA      Social History History  Substance Use Topics  . Smoking status: Former Smoker -- 1.00 packs/day for 40 years    Types: Cigarettes    Quit date: 04/28/1991  . Smokeless tobacco: Never Used     Comment: occ alcohol  . Alcohol Use: Yes     Comment: occasional    Family History Family History  Problem Relation Age of Onset  . Coronary artery disease Neg Hx   . Anesthesia problems Neg Hx   . Hypotension Neg Hx   .   Malignant hyperthermia Neg Hx   . Pseudochol deficiency Neg Hx   . Hyperlipidemia Mother   . Hypertension Mother   . Cancer Mother   . Hyperlipidemia Father   . Hypertension Father   . Kidney disease Father   . Heart disease Sister   . Alzheimer's disease Sister   . Cancer Brother   . Other Sister     Allergies  No Known Allergies   Current Outpatient Prescriptions  Medication Sig Dispense Refill  . amLODipine (NORVASC) 10 MG tablet Take 1 tablet (10 mg total) by mouth every morning.  30 tablet  5  . aspirin EC 81 MG tablet Take 81 mg by mouth every morning.       . cephALEXin (KEFLEX) 500 MG capsule Take two tablets by mouth twice daily for infection      . docusate sodium (COLACE) 100 MG capsule Take 100 mg by mouth 2 (two) times daily as needed for constipation.      . doxazosin (CARDURA) 1 MG tablet Take 0.5 mg by mouth at  bedtime. Take 1/2 tablet by mouth at bedtime for blood pressure      . insulin aspart (NOVOLOG FLEXPEN) 100 UNIT/ML injection Inject 0-5 Units into the skin 3 (three) times daily before meals. Sliding scale 0 units=below 150 anything greater 5 units      . insulin detemir (LEVEMIR FLEXPEN) 100 UNIT/ML injection Inject 18 Units into the skin at bedtime.      . isosorbide-hydrALAZINE (BIDIL) 20-37.5 MG per tablet Take 1 tablet by mouth 2 (two) times daily.  60 tablet  0  . oxyCODONE (OXY IR/ROXICODONE) 5 MG immediate release tablet Take one tablet every 12 hours as needed for pain  60 tablet  0  . polyethylene glycol (MIRALAX / GLYCOLAX) packet Take 17 g by mouth daily.      . pravastatin (PRAVACHOL) 80 MG tablet Take 40 mg by mouth every morning.      . pregabalin (LYRICA) 200 MG capsule Take 200 mg by mouth 2 (two) times daily.       No current facility-administered medications for this visit.    ROS:   General:  No weight loss, Fever, chills  HEENT: No recent headaches, no nasal bleeding, no visual changes, no sore throat  Neurologic: No dizziness, blackouts, seizures. No recent symptoms of stroke or mini- stroke. No recent episodes of slurred speech, or temporary blindness.  Cardiac: No recent episodes of chest pain/pressure, no shortness of breath at rest.  No shortness of breath with exertion.  Denies history of atrial fibrillation or irregular heartbeat  Vascular: No history of rest pain in feet.  No history of claudication.  + history of non-healing ulcer, No history of DVT   Pulmonary: No home oxygen, no productive cough, no hemoptysis,  No asthma or wheezing  Musculoskeletal:  [ ] Arthritis, [ ] Low back pain,  [ ] Joint pain  Hematologic:No history of hypercoagulable state.  No history of easy bleeding.  No history of anemia  Gastrointestinal: No hematochezia or melena,  No gastroesophageal reflux, no trouble swallowing  Urinary: [x ] chronic Kidney disease, [ ] on HD - [ ]  MWF or [ ] TTHS, [ ] Burning with urination, [ ] Frequent urination, [ ] Difficulty urinating;   Skin: No rashes  Psychological: No history of anxiety,  No history of depression   Physical Examination  Filed Vitals:   06/01/12 1441  BP: 173/65  Pulse: 71  Temp: 98.2   F (36.8 C)  TempSrc: Oral  Height: 5' 8" (1.727 m)  Weight: 209 lb (94.802 kg)  SpO2: 100%    Body mass index is 31.79 kg/(m^2).  General:  Alert and oriented, no acute distress HEENT: Normal Neck: No bruit or JVD Pulmonary: Clear to auscultation bilaterally Cardiac: Regular Rate and Rhythm without murmur Abdomen: Soft, non-tender, non-distended, no mass Skin: No rash, gangrene nail bed of right first toe no erythema no discharge Extremity Pulses:  2+ radial, brachial, femoral, dorsalis pedis, posterior tibial pulses bilaterally Musculoskeletal: No deformity or edema, left below-knee amputation  Neurologic: Upper and lower extremity motor 5/5 and symmetric  DATA: The patient had arterial duplex exam today which are reviewed and interpreted. She suggested some mild superficial femoral occlusive disease as well as tibial artery occlusive disease ABI was 0.52 monophasic waveforms in the DP   ASSESSMENT: Severe peripheral arterial disease limb threatening ischemia   PLAN: Aortogram with lower assuring a runoff possible intervention tomorrow. The patient will be brought to the hospital early for hydration to try to avoid contrast nephropathy. Risks benefits possible complications and procedure details of arteriography intervention was explained to the patient and his wife today. They understand and agree to proceed.  Kyheem Bathgate, MD [facility] [place] Office: [phone]     For VQI Use Only    PRE-ADM LIVING: Home  AMB STATUS: Ambulatory  CAD Sx: None  PRIOR CHF: Mild  STRESS TEST: [x ] No, [ ] Normal, [ ] + ischemia, [ ] + MI, [ ] Both   Pager: [phone]  

## 2012-06-09 NOTE — Op Note (Signed)
Procedure: Right lower extremity arteriogram angioplasty of right tibio-peroneal trunk Preoperative diagnosis: Gangrene right first toe Postoperative diagnosis: Same Anesthesia: Local Operative findings: 90% stenosis of right tibia peroneal trunk angioplasty to 0% residual stenosis, one vessel runoff to the right foot via the peroneal with intact plantar arch.  45 cc contrast.  Operative details: After obtaining informed consent, the patient was taken to the PV lab. The patient was placed in supine position the Angio table. Both groins were prepped and draped in the usual sterile fashion. Local anesthesia was infiltrated over the left common femoral artery.  Ultrasound was used to identify the left common femoral artery and the left common femoral bifurcation. An introducer needle was then used to cannulate the left common femoral artery without difficulty. An 035 first core wire was then threaded up the abdominal aorta under fluoroscopic guidance. A 5 French sheath was placed over the guidewire and the left common femoral artery. A 5 French crossover catheter was then brought in the operative field this was placed over the guidewire used to selectively catheterize the right common iliac artery. The Versacore wire was advanced into the distal right external iliac artery. The 5 French sheath was then removed over a guidewire and exchanged for a 7 [place] Terumo sheath. This was advanced up the left iliac system but would not cross over the bifurcation do to a narrow aortic bifurcation. At this point the 7 French sheath was left in the left common iliac artery. The 5 [place] crossover catheter was brought back up in the operative field and the right common iliac artery selectively catheterized. An 035 Amplatz wire was then placed through the crossover catheter down to the right external iliac artery. A quick cross catheter was then placed over the Amplatz wire into the distal right external iliac artery an Amplatz  wire removed. The 035 versacore wire was advanced through the quick cross catheter down into the right superficial femoral artery. At this point the dilator was placed back in the Terumo sheath and I was able to advance this up and over the aortic bifurcation and advanced through the sheath into the right superficial femoral artery. An 035 angled Glidewire was then used to advance into the above-knee popliteal artery. The quick cross catheter was advanced to this level. Contrast angiogram was then obtained of the lower leg. It shows a high-grade stenosis in the tibioperoneal trunk 90% the peroneal artery was otherwise patent there was one vessel runoff to the right foot.  The patient was given 7000 units of intravenous heparin. He was also given an additional bolus of 3000 units of heparin an ACT was greater than 250. An 014 Spartacore wire was then advanced through the quick cross catheter and across the stenosis into the distal peroneal artery. A 2 x 2 angioplasty balloon was then brought in the operative field and advanced to the level of stenosis and inflated to 10 atmospheres for 1 minute.  The 2 x 2 angioplasty balloon was then removed over the guidewire and exchanged for a 4 x 2 angioplasty balloon and this was also inflated to 10 atmospheres for 1 minute.  A completion arteriogram was then obtained which showed 0 residual percent stenosis of the tibioperoneal trunk in that runoff via the peroneal was still filling have a complete plantar arch. At this point the cross catheter and Spartacore wire were removed. The versacore wire was placed back through the Terumo sheath as well as the dilator. The sheath was then pulled back  into the distal left external iliac artery over the guidewire and the dilator and guidewire were removed. The sheath was thoroughly flushed with heparinized saline. The patient tolerated the procedure well and there were complications. The patient was taken to the holding area in stable  condition.  Operative plan: Patient will have amputation of his right first toe on Monday, June 2.  Fabienne Bruns, MD [facility] Office: [phone] Pager: [phone]

## 2012-06-11 MED ORDER — DEXTROSE 5 % IV SOLN: 1.5000 g | INTRAVENOUS | Status: DC

## 2012-06-11 MED ORDER — DEXTROSE 5 % IV SOLN
1.5000 g | INTRAVENOUS | Status: AC
  Administered 2012-06-12: 1.5 g via INTRAVENOUS
  Filled 2012-06-11: qty 1.5

## 2012-06-12 ENCOUNTER — Ambulatory Visit (HOSPITAL_COMMUNITY): Payer: Medicare Other | Admitting: Anesthesiology

## 2012-06-12 ENCOUNTER — Ambulatory Visit (HOSPITAL_COMMUNITY)
Admission: RE | Admit: 2012-06-12 | Discharge: 2012-06-14 | Disposition: A | Discharge status: Home / Self Care | Payer: Medicare Other | Source: Ambulatory Visit | Attending: Vascular Surgery | Admitting: Vascular Surgery

## 2012-06-12 ENCOUNTER — Encounter (HOSPITAL_COMMUNITY): Payer: Self-pay | Admitting: Anesthesiology

## 2012-06-12 ENCOUNTER — Telehealth: Payer: Self-pay | Admitting: Vascular Surgery

## 2012-06-12 ENCOUNTER — Encounter (HOSPITAL_COMMUNITY): Payer: Self-pay | Admitting: Surgery

## 2012-06-12 ENCOUNTER — Encounter (HOSPITAL_COMMUNITY)
Admission: RE | Disposition: A | Discharge status: Home / Self Care | Payer: Self-pay | Source: Ambulatory Visit | Attending: Vascular Surgery

## 2012-06-12 DIAGNOSIS — S88119A Complete traumatic amputation at level between knee and ankle, unspecified lower leg, initial encounter: Secondary | ICD-10-CM | POA: Insufficient documentation

## 2012-06-12 DIAGNOSIS — E1142 Type 2 diabetes mellitus with diabetic polyneuropathy: Secondary | ICD-10-CM | POA: Insufficient documentation

## 2012-06-12 DIAGNOSIS — E119 Type 2 diabetes mellitus without complications: Secondary | ICD-10-CM | POA: Diagnosis not present

## 2012-06-12 DIAGNOSIS — E785 Hyperlipidemia, unspecified: Secondary | ICD-10-CM | POA: Diagnosis not present

## 2012-06-12 DIAGNOSIS — L97509 Non-pressure chronic ulcer of other part of unspecified foot with unspecified severity: Secondary | ICD-10-CM | POA: Insufficient documentation

## 2012-06-12 DIAGNOSIS — Z7901 Long term (current) use of anticoagulants: Secondary | ICD-10-CM | POA: Insufficient documentation

## 2012-06-12 DIAGNOSIS — I2510 Atherosclerotic heart disease of native coronary artery without angina pectoris: Secondary | ICD-10-CM | POA: Diagnosis not present

## 2012-06-12 DIAGNOSIS — E1352 Other specified diabetes mellitus with diabetic peripheral angiopathy with gangrene: Secondary | ICD-10-CM

## 2012-06-12 DIAGNOSIS — I70269 Atherosclerosis of native arteries of extremities with gangrene, unspecified extremity: Secondary | ICD-10-CM | POA: Insufficient documentation

## 2012-06-12 DIAGNOSIS — M869 Osteomyelitis, unspecified: Secondary | ICD-10-CM | POA: Diagnosis not present

## 2012-06-12 DIAGNOSIS — E1149 Type 2 diabetes mellitus with other diabetic neurological complication: Secondary | ICD-10-CM | POA: Diagnosis not present

## 2012-06-12 DIAGNOSIS — N189 Chronic kidney disease, unspecified: Secondary | ICD-10-CM | POA: Diagnosis not present

## 2012-06-12 DIAGNOSIS — E1159 Type 2 diabetes mellitus with other circulatory complications: Secondary | ICD-10-CM | POA: Diagnosis not present

## 2012-06-12 DIAGNOSIS — Z86718 Personal history of other venous thrombosis and embolism: Secondary | ICD-10-CM | POA: Diagnosis not present

## 2012-06-12 DIAGNOSIS — Z79899 Other long term (current) drug therapy: Secondary | ICD-10-CM | POA: Insufficient documentation

## 2012-06-12 DIAGNOSIS — N400 Enlarged prostate without lower urinary tract symptoms: Secondary | ICD-10-CM | POA: Diagnosis not present

## 2012-06-12 DIAGNOSIS — I129 Hypertensive chronic kidney disease with stage 1 through stage 4 chronic kidney disease, or unspecified chronic kidney disease: Secondary | ICD-10-CM | POA: Diagnosis not present

## 2012-06-12 DIAGNOSIS — C61 Malignant neoplasm of prostate: Secondary | ICD-10-CM | POA: Diagnosis not present

## 2012-06-12 DIAGNOSIS — Z794 Long term (current) use of insulin: Secondary | ICD-10-CM | POA: Diagnosis not present

## 2012-06-12 DIAGNOSIS — I10 Essential (primary) hypertension: Secondary | ICD-10-CM | POA: Diagnosis not present

## 2012-06-12 DIAGNOSIS — Z951 Presence of aortocoronary bypass graft: Secondary | ICD-10-CM | POA: Insufficient documentation

## 2012-06-12 DIAGNOSIS — Z89512 Acquired absence of left leg below knee: Secondary | ICD-10-CM

## 2012-06-12 HISTORY — PX: AMPUTATION: SHX166

## 2012-06-12 LAB — CBC
HCT: 29.6 % — ABNORMAL LOW (ref 39.0–52.0)
MCV: 90.5 fL (ref 78.0–100.0)
RDW: 13.9 % (ref 11.5–15.5)
WBC: 5.9 10*3/uL (ref 4.0–10.5)

## 2012-06-12 LAB — URINE MICROSCOPIC-ADD ON

## 2012-06-12 LAB — GLUCOSE, CAPILLARY
Glucose-Capillary: 142 mg/dL — ABNORMAL HIGH (ref 70–99)
Glucose-Capillary: 153 mg/dL — ABNORMAL HIGH (ref 70–99)
Glucose-Capillary: 149 mg/dL — ABNORMAL HIGH (ref 70–99)

## 2012-06-12 LAB — URINALYSIS, ROUTINE W REFLEX MICROSCOPIC
Glucose, UA: NEGATIVE mg/dL
Leukocytes, UA: NEGATIVE
Nitrite: NEGATIVE
Specific Gravity, Urine: 1.015 (ref 1.005–1.030)
pH: 5.5 (ref 5.0–8.0)

## 2012-06-12 LAB — POCT I-STAT 4, (NA,K, GLUC, HGB,HCT)
HCT: 32 % — ABNORMAL LOW (ref 39.0–52.0)
Hemoglobin: 10.9 g/dL — ABNORMAL LOW (ref 13.0–17.0)

## 2012-06-12 LAB — CREATININE, SERUM: GFR calc Af Amer: 25 mL/min — ABNORMAL LOW (ref >90)

## 2012-06-12 SURGERY — AMPUTATION DIGIT
Anesthesia: Choice | Site: Toe | Laterality: Right | Wound class: Dirty or Infected

## 2012-06-12 MED ORDER — INSULIN ASPART 100 UNIT/ML ~~LOC~~ SOLN
0.0000 [IU] | Freq: Three times a day (TID) | SUBCUTANEOUS | Status: DC
  Administered 2012-06-12: 4 [IU] via SUBCUTANEOUS
  Administered 2012-06-12: 3 [IU] via SUBCUTANEOUS
  Administered 2012-06-13 (×2): 4 [IU] via SUBCUTANEOUS
  Administered 2012-06-14: 3 [IU] via SUBCUTANEOUS

## 2012-06-12 MED ORDER — OXYCODONE HCL 5 MG/5ML PO SOLN: 5.0000 mg | Freq: Once | ORAL | Status: DC | PRN

## 2012-06-12 MED ORDER — LABETALOL HCL 5 MG/ML IV SOLN
10.0000 mg | INTRAVENOUS | Status: DC | PRN
  Filled 2012-06-12: qty 4

## 2012-06-12 MED ORDER — ONDANSETRON HCL 4 MG/2ML IJ SOLN: 4.0000 mg | Freq: Once | INTRAMUSCULAR | Status: AC | PRN

## 2012-06-12 MED ORDER — FENTANYL CITRATE 0.05 MG/ML IJ SOLN
INTRAMUSCULAR | Status: DC | PRN
  Administered 2012-06-12: 100 ug via INTRAVENOUS

## 2012-06-12 MED ORDER — PREGABALIN 100 MG PO CAPS
200.0000 mg | ORAL_CAPSULE | Freq: Two times a day (BID) | ORAL | Status: DC
  Administered 2012-06-12 – 2012-06-13 (×4): 200 mg via ORAL
  Filled 2012-06-12 (×3): qty 2
  Filled 2012-06-12: qty 4

## 2012-06-12 MED ORDER — AMLODIPINE BESYLATE 5 MG PO TABS
5.0000 mg | ORAL_TABLET | Freq: Every day | ORAL | Status: DC
  Administered 2012-06-13 – 2012-06-14 (×2): 5 mg via ORAL
  Filled 2012-06-12 (×3): qty 1

## 2012-06-12 MED ORDER — SODIUM CHLORIDE 0.9 % IV SOLN
INTRAVENOUS | Status: DC
  Administered 2012-06-12 (×2): via INTRAVENOUS

## 2012-06-12 MED ORDER — GUAIFENESIN-DM 100-10 MG/5ML PO SYRP: 15.0000 mL | ORAL_SOLUTION | ORAL | Status: DC | PRN

## 2012-06-12 MED ORDER — INSULIN ASPART 100 UNIT/ML ~~LOC~~ SOLN: 0.0000 [IU] | Freq: Three times a day (TID) | SUBCUTANEOUS | Status: DC

## 2012-06-12 MED ORDER — PROPOFOL 10 MG/ML IV BOLUS
INTRAVENOUS | Status: DC | PRN
  Administered 2012-06-12: 150 mg via INTRAVENOUS

## 2012-06-12 MED ORDER — ACETAMINOPHEN 325 MG PO TABS: 325.0000 mg | ORAL_TABLET | ORAL | Status: DC | PRN

## 2012-06-12 MED ORDER — OXYCODONE HCL 5 MG/5ML PO SOLN: 5.0000 mg | Freq: Once | ORAL | Status: AC | PRN

## 2012-06-12 MED ORDER — ACETAMINOPHEN 650 MG RE SUPP: 325.0000 mg | RECTAL | Status: DC | PRN

## 2012-06-12 MED ORDER — ENOXAPARIN SODIUM 40 MG/0.4ML ~~LOC~~ SOLN
40.0000 mg | SUBCUTANEOUS | Status: DC
  Administered 2012-06-13 – 2012-06-14 (×2): 40 mg via SUBCUTANEOUS
  Filled 2012-06-12 (×4): qty 0.4

## 2012-06-12 MED ORDER — DOCUSATE SODIUM 100 MG PO CAPS
100.0000 mg | ORAL_CAPSULE | Freq: Every day | ORAL | Status: DC
  Administered 2012-06-13: 100 mg via ORAL
  Filled 2012-06-12: qty 1

## 2012-06-12 MED ORDER — CLOPIDOGREL BISULFATE 75 MG PO TABS
75.0000 mg | ORAL_TABLET | Freq: Every day | ORAL | Status: DC
  Administered 2012-06-13 – 2012-06-14 (×2): 75 mg via ORAL
  Filled 2012-06-12 (×4): qty 1

## 2012-06-12 MED ORDER — HYDRALAZINE HCL 20 MG/ML IJ SOLN: 10.0000 mg | INTRAMUSCULAR | Status: DC | PRN

## 2012-06-12 MED ORDER — POLYETHYLENE GLYCOL 3350 17 G PO PACK
17.0000 g | PACK | Freq: Every day | ORAL | Status: DC
  Administered 2012-06-12 – 2012-06-13 (×2): 17 g via ORAL
  Filled 2012-06-12 (×3): qty 1

## 2012-06-12 MED ORDER — INSULIN DETEMIR 100 UNIT/ML ~~LOC~~ SOLN
18.0000 [IU] | Freq: Every day | SUBCUTANEOUS | Status: DC
  Administered 2012-06-12 – 2012-06-13 (×2): 18 [IU] via SUBCUTANEOUS
  Filled 2012-06-12 (×3): qty 0.18

## 2012-06-12 MED ORDER — SIMVASTATIN 40 MG PO TABS
40.0000 mg | ORAL_TABLET | Freq: Every day | ORAL | Status: DC
  Administered 2012-06-12 – 2012-06-13 (×2): 40 mg via ORAL
  Filled 2012-06-12 (×2): qty 1

## 2012-06-12 MED ORDER — ONDANSETRON HCL 4 MG/2ML IJ SOLN: 4.0000 mg | Freq: Four times a day (QID) | INTRAMUSCULAR | Status: DC | PRN

## 2012-06-12 MED ORDER — MORPHINE SULFATE 2 MG/ML IJ SOLN
2.0000 mg | INTRAMUSCULAR | Status: DC | PRN
  Administered 2012-06-12 – 2012-06-13 (×3): 2 mg via INTRAVENOUS
  Filled 2012-06-12 (×3): qty 1

## 2012-06-12 MED ORDER — 0.9 % SODIUM CHLORIDE (POUR BTL) OPTIME
TOPICAL | Status: DC | PRN
  Administered 2012-06-12: 1000 mL

## 2012-06-12 MED ORDER — ALUM & MAG HYDROXIDE-SIMETH 200-200-20 MG/5ML PO SUSP: 15.0000 mL | ORAL | Status: DC | PRN

## 2012-06-12 MED ORDER — HYDROMORPHONE HCL PF 1 MG/ML IJ SOLN: 0.2500 mg | INTRAMUSCULAR | Status: DC | PRN

## 2012-06-12 MED ORDER — OXYCODONE-ACETAMINOPHEN 5-325 MG PO TABS
1.0000 | ORAL_TABLET | ORAL | Status: DC | PRN
  Administered 2012-06-12 (×2): 1 via ORAL
  Administered 2012-06-13 – 2012-06-14 (×6): 2 via ORAL
  Filled 2012-06-12 (×4): qty 2
  Filled 2012-06-12 (×2): qty 1
  Filled 2012-06-12 (×2): qty 2

## 2012-06-12 MED ORDER — DOXAZOSIN MESYLATE 1 MG PO TABS
0.5000 mg | ORAL_TABLET | Freq: Every day | ORAL | Status: DC
  Administered 2012-06-12 – 2012-06-13 (×2): 0.5 mg via ORAL
  Filled 2012-06-12 (×3): qty 0.5

## 2012-06-12 MED ORDER — ASPIRIN EC 81 MG PO TBEC
81.0000 mg | DELAYED_RELEASE_TABLET | Freq: Every morning | ORAL | Status: DC
  Administered 2012-06-13: 81 mg via ORAL
  Filled 2012-06-12 (×2): qty 1

## 2012-06-12 MED ORDER — PHENOL 1.4 % MT LIQD: 1.0000 | OROMUCOSAL | Status: DC | PRN

## 2012-06-12 MED ORDER — LIDOCAINE HCL (CARDIAC) 20 MG/ML IV SOLN
INTRAVENOUS | Status: DC | PRN
  Administered 2012-06-12: 50 mg via INTRAVENOUS

## 2012-06-12 MED ORDER — OXYCODONE HCL 5 MG PO TABS: 5.0000 mg | ORAL_TABLET | Freq: Once | ORAL | Status: DC | PRN

## 2012-06-12 MED ORDER — METOPROLOL TARTRATE 1 MG/ML IV SOLN: 2.0000 mg | INTRAVENOUS | Status: DC | PRN

## 2012-06-12 MED ORDER — PANTOPRAZOLE SODIUM 40 MG PO TBEC
40.0000 mg | DELAYED_RELEASE_TABLET | Freq: Every day | ORAL | Status: DC
  Administered 2012-06-12 – 2012-06-13 (×2): 40 mg via ORAL
  Filled 2012-06-12 (×2): qty 1

## 2012-06-12 MED ORDER — HYDROMORPHONE HCL PF 1 MG/ML IJ SOLN
INTRAMUSCULAR | Status: AC
  Administered 2012-06-12: 0.5 mg
  Filled 2012-06-12: qty 1

## 2012-06-12 MED ORDER — MEPERIDINE HCL 25 MG/ML IJ SOLN: 6.2500 mg | INTRAMUSCULAR | Status: DC | PRN

## 2012-06-12 MED ORDER — OXYCODONE HCL 5 MG PO TABS: 5.0000 mg | ORAL_TABLET | Freq: Once | ORAL | Status: AC | PRN

## 2012-06-12 MED ORDER — DEXTROSE 5 % IV SOLN
1.5000 g | Freq: Two times a day (BID) | INTRAVENOUS | Status: AC
  Administered 2012-06-12 – 2012-06-13 (×2): 1.5 g via INTRAVENOUS
  Filled 2012-06-12 (×2): qty 1.5

## 2012-06-12 MED ORDER — ONDANSETRON HCL 4 MG/2ML IJ SOLN: 4.0000 mg | Freq: Once | INTRAMUSCULAR | Status: DC | PRN

## 2012-06-12 SURGICAL SUPPLY — 31 items
BANDAGE ELASTIC 3 VELCRO ST LF (GAUZE/BANDAGES/DRESSINGS) ×1 IMPLANT
BANDAGE ELASTIC 4 VELCRO ST LF (GAUZE/BANDAGES/DRESSINGS) ×2 IMPLANT
BANDAGE GAUZE ELAST BULKY 4 IN (GAUZE/BANDAGES/DRESSINGS) ×2 IMPLANT
CANISTER SUCTION 2500CC (MISCELLANEOUS) ×2 IMPLANT
CLOTH BEACON ORANGE TIMEOUT ST (SAFETY) ×2 IMPLANT
COVER SURGICAL LIGHT HANDLE (MISCELLANEOUS) ×2 IMPLANT
DRAPE EXTREMITY T 121X128X90 (DRAPE) ×2 IMPLANT
DRSG EMULSION OIL 3X3 NADH (GAUZE/BANDAGES/DRESSINGS) ×2 IMPLANT
ELECT REM PT RETURN 9FT ADLT (ELECTROSURGICAL) ×2
ELECTRODE REM PT RTRN 9FT ADLT (ELECTROSURGICAL) ×1 IMPLANT
GLOVE BIO SURGEON STRL SZ7.5 (GLOVE) ×2 IMPLANT
GLOVE BIOGEL PI IND STRL 6.5 (GLOVE) IMPLANT
GLOVE BIOGEL PI INDICATOR 6.5 (GLOVE) ×1
GOWN PREVENTION PLUS XLARGE (GOWN DISPOSABLE) ×2 IMPLANT
GOWN STRL NON-REIN LRG LVL3 (GOWN DISPOSABLE) ×2 IMPLANT
KIT BASIN OR (CUSTOM PROCEDURE TRAY) ×2 IMPLANT
KIT ROOM TURNOVER OR (KITS) ×2 IMPLANT
NS IRRIG 1000ML POUR BTL (IV SOLUTION) ×2 IMPLANT
PACK GENERAL/GYN (CUSTOM PROCEDURE TRAY) ×2 IMPLANT
PAD ARMBOARD 7.5X6 YLW CONV (MISCELLANEOUS) ×4 IMPLANT
SPECIMEN JAR SMALL (MISCELLANEOUS) ×2 IMPLANT
SPONGE GAUZE 4X4 12PLY (GAUZE/BANDAGES/DRESSINGS) ×2 IMPLANT
SUT ETHILON 3 0 PS 1 (SUTURE) ×4 IMPLANT
SUT VIC AB 3-0 SH 27 (SUTURE) ×2
SUT VIC AB 3-0 SH 27X BRD (SUTURE) IMPLANT
SWAB COLLECTION DEVICE MRSA (MISCELLANEOUS) IMPLANT
TOWEL OR 17X24 6PK STRL BLUE (TOWEL DISPOSABLE) ×2 IMPLANT
TOWEL OR 17X26 10 PK STRL BLUE (TOWEL DISPOSABLE) ×2 IMPLANT
TUBE ANAEROBIC SPECIMEN COL (MISCELLANEOUS) IMPLANT
UNDERPAD 30X30 INCONTINENT (UNDERPADS AND DIAPERS) ×2 IMPLANT
WATER STERILE IRR 1000ML POUR (IV SOLUTION) ×2 IMPLANT

## 2012-06-12 NOTE — Op Note (Signed)
Procedure: Amputation right first toe with resection of metatarsal head  Preoperative diagnosis: Gangrene right first toe  Postoperative diagnosis: Same  Anesthesia: Gen.  Indications: Patient recently had revascularization of his right lower extremity and has gangrene of his right first toe  Specimens: Right first toe  Assistant: Nurse  Operative details: After obtaining informed consent, the patient was taken to the operating room. The patient was placed in supine position on the operating room table. After induction of general anesthesia the patient's entire right foot was prepped and draped in usual sterile fashion. A circumferential incision was made at the base of the right first toe. The incision was carried all the way down to the level of the bone. There was some pulsatile bleeding from digital vessels. This was controlled with cautery. The bone was transected with a bone cutter in the midportion of the proximal phalanx. The remainder of the proximal phalanx was debrided away with rongeurs. The metatarsal head was removed with rongeurs. The wound was thoroughly irrigated with normal saline solution. Hemostasis was obtained. Skin edges were reapproximated using interrupted 3-0 Nylon mattress and simple nylon sutures. A dry sterile dressing was applied. The patient tolerated procedure well and there were no complications. Instrument sponge and needle counts were correct at the end of the case. The patient was taken to recovery in stable condition.  Fabienne Bruns, MD  [facility]  Office: [phone]  Pager: [phone]

## 2012-06-12 NOTE — Transfer of Care (Signed)
Immediate Anesthesia Transfer of Care Note  Patient: Aaron Long  Procedure(s) Performed: Procedure(s) with comments: AMPUTATION DIGIT (Right) - GREAT TOE  Patient Location: PACU  Anesthesia Type:General  Level of Consciousness: sedated  Airway & Oxygen Therapy: Patient Spontanous Breathing and Patient connected to nasal cannula oxygen  Post-op Assessment: Report given to PACU RN and Post -op Vital signs reviewed and stable  Post vital signs: Reviewed and stable  Complications: No apparent anesthesia complications

## 2012-06-12 NOTE — Interval H&P Note (Signed)
History and Physical Interval Note:  06/12/2012 7:32 AM  Aaron Long  has presented today for surgery, with the diagnosis of PVD WITH GANGRENE TOE  The various methods of treatment have been discussed with the patient and family. After consideration of risks, benefits and other options for treatment, the patient has consented to  Procedure(s) with comments: AMPUTATION DIGIT (Right) - GREAT TOE as a surgical intervention .  [name] history has been reviewed, patient examined, no change in status, stable for surgery.  I have reviewed the patient's chart and labs.  Questions were answered to the patient's satisfaction.     FIELDS,CHARLES E

## 2012-06-12 NOTE — Care Management Note (Signed)
  Page 1 of 1   06/12/2012     2:22:14 PM   CARE MANAGEMENT NOTE 06/12/2012  Patient:  Aaron Long, Aaron Long   Account Number:  [id number]  Date Initiated:  06/12/2012  Documentation initiated by:  Ronny Flurry  Subjective/Objective Assessment:     Action/Plan:   Anticipated DC Date:  06/13/2012   Anticipated DC Plan:  HOME W HOME HEALTH SERVICES         Choice offered to / List presented to:  C-1 Patient        HH arranged  HH-1 RN  HH-2 PT      HH agency  [facility]   Status of service:  Completed, signed off Medicare Important Message given?   (If response is "NO", the following Medicare IM given date fields will be blank) Date Medicare IM given:   Date Additional Medicare IM given:    Discharge Disposition:    Per UR Regulation:    If discussed at Long Length of Stay Meetings, dates discussed:    Comments:  06-12-12 Patient will be staying in [place] at discharge .  [address] , [place] , [address]  cell phone [phone]  Ronny Flurry RN BSN [phone]

## 2012-06-12 NOTE — Anesthesia Postprocedure Evaluation (Signed)
Anesthesia Post Note  Patient: Aaron Long  Procedure(s) Performed: Procedure(s) (LRB): AMPUTATION DIGIT (Right)  Anesthesia type: general  Patient location: PACU  Post pain: Pain level controlled  Post assessment: Patient's Cardiovascular Status Stable  Last Vitals:  Filed Vitals:   06/12/12 1030  BP:   Pulse: 67  Temp:   Resp: 9    Post vital signs: Reviewed and stable  Level of consciousness: sedated  Complications: No apparent anesthesia complications

## 2012-06-12 NOTE — H&P (View-Only) (Signed)
[facility] HISTORY AND PHYSICAL   History of Present Illness:  Patient is a 72 y.o. year old male who presents for evaluation of a nonhealing right first toe. The patient is seen at the request of Dr. Petery.  He stated that the toe was rubbing on his shoe. The wound is now been present for approximately 2 months. He thinks it may be slightly improved. He is currently on Keflex. He denies any pain in the toe. He has previous had a left below-knee amputation. He does have a history of renal insufficiency..  Other medical problems include peripheral arterial disease, neuropathy, hypertension, diabetes, coronary disease. He is on a statin and aspirin.  Past Medical History  Diagnosis Date  . Prostate cancer 2010  . PAD (peripheral artery disease)   . Gangrene of toe     dry  . Neuromuscular disorder     diabetic neuropathy  . Hypertension     takes Amlodipine daily and Metoprolol bid  . Hyperlipidemia     takes Pravastatin daily  . Myocardial infarction 2005  . Peripheral neuropathy   . Constipation     takes Miralax daily  . Hemorrhoids   . Hx of colonic polyps   . History of kidney stones   . Diabetes mellitus     Novolog and Levemir daily  . Coronary artery disease   . BPH (benign prostatic hypertrophy)   . Abnormality of gait   . Coronary atherosclerosis of native coronary artery   . Phlebitis and thrombophlebitis of other deep vessels of lower extremities   . Chronic kidney disease   . Hypertrophy of prostate without urinary obstruction and other lower urinary tract symptoms (LUTS)   . Disorder of bone and cartilage, unspecified   . Lower limb amputation, below knee   . Long term (current) use of anticoagulants     Past Surgical History  Procedure Laterality Date  . Prostate surgery  2009    prostatectomy  . Knee cartilage surgery  at age 21    left knee  . Cea      Right  . Coronary artery bypass graft  2005    x 4  . Carotid  endarterectomy  2005    right  . Cataract surgery  2011    bilateral  . Colonoscopy    . Cardiac catheterization  05/28/11  . Amputation  06/14/2011    Procedure: AMPUTATION DIGIT;  Surgeon: Charles E Fields, MD;  Location: [facility] OR;  Service: Vascular;  Laterality: Left;  Amputation Left fifth toe  . Amputation  06/16/2011    Procedure: AMPUTATION BELOW KNEE;  Surgeon: Charles E Fields, MD;  Location: [facility] OR;  Service: Vascular;  Laterality: Left;  . I&d extremity  01/31/2012    Procedure: IRRIGATION AND DEBRIDEMENT EXTREMITY;  Surgeon: Charles E Fields, MD;  Location: [facility] OR;  Service: Vascular;  Laterality: Left;  I & D Left BKA      Social History History  Substance Use Topics  . Smoking status: Former Smoker -- 1.00 packs/day for 40 years    Types: Cigarettes    Quit date: 04/28/1991  . Smokeless tobacco: Never Used     Comment: occ alcohol  . Alcohol Use: Yes     Comment: occasional    Family History Family History  Problem Relation Age of Onset  . Coronary artery disease Neg Hx   . Anesthesia problems Neg Hx   . Hypotension Neg Hx   .   Malignant hyperthermia Neg Hx   . Pseudochol deficiency Neg Hx   . Hyperlipidemia Mother   . Hypertension Mother   . Cancer Mother   . Hyperlipidemia Father   . Hypertension Father   . Kidney disease Father   . Heart disease Sister   . Alzheimer's disease Sister   . Cancer Brother   . Other Sister     Allergies  No Known Allergies   Current Outpatient Prescriptions  Medication Sig Dispense Refill  . amLODipine (NORVASC) 10 MG tablet Take 1 tablet (10 mg total) by mouth every morning.  30 tablet  5  . aspirin EC 81 MG tablet Take 81 mg by mouth every morning.       . cephALEXin (KEFLEX) 500 MG capsule Take two tablets by mouth twice daily for infection      . docusate sodium (COLACE) 100 MG capsule Take 100 mg by mouth 2 (two) times daily as needed for constipation.      . doxazosin (CARDURA) 1 MG tablet Take 0.5 mg by mouth at  bedtime. Take 1/2 tablet by mouth at bedtime for blood pressure      . insulin aspart (NOVOLOG FLEXPEN) 100 UNIT/ML injection Inject 0-5 Units into the skin 3 (three) times daily before meals. Sliding scale 0 units=below 150 anything greater 5 units      . insulin detemir (LEVEMIR FLEXPEN) 100 UNIT/ML injection Inject 18 Units into the skin at bedtime.      . isosorbide-hydrALAZINE (BIDIL) 20-37.5 MG per tablet Take 1 tablet by mouth 2 (two) times daily.  60 tablet  0  . oxyCODONE (OXY IR/ROXICODONE) 5 MG immediate release tablet Take one tablet every 12 hours as needed for pain  60 tablet  0  . polyethylene glycol (MIRALAX / GLYCOLAX) packet Take 17 g by mouth daily.      . pravastatin (PRAVACHOL) 80 MG tablet Take 40 mg by mouth every morning.      . pregabalin (LYRICA) 200 MG capsule Take 200 mg by mouth 2 (two) times daily.       No current facility-administered medications for this visit.    ROS:   General:  No weight loss, Fever, chills  HEENT: No recent headaches, no nasal bleeding, no visual changes, no sore throat  Neurologic: No dizziness, blackouts, seizures. No recent symptoms of stroke or mini- stroke. No recent episodes of slurred speech, or temporary blindness.  Cardiac: No recent episodes of chest pain/pressure, no shortness of breath at rest.  No shortness of breath with exertion.  Denies history of atrial fibrillation or irregular heartbeat  Vascular: No history of rest pain in feet.  No history of claudication.  + history of non-healing ulcer, No history of DVT   Pulmonary: No home oxygen, no productive cough, no hemoptysis,  No asthma or wheezing  Musculoskeletal:  [ ] Arthritis, [ ] Low back pain,  [ ] Joint pain  Hematologic:No history of hypercoagulable state.  No history of easy bleeding.  No history of anemia  Gastrointestinal: No hematochezia or melena,  No gastroesophageal reflux, no trouble swallowing  Urinary: [x ] chronic Kidney disease, [ ] on HD - [ ]  MWF or [ ] TTHS, [ ] Burning with urination, [ ] Frequent urination, [ ] Difficulty urinating;   Skin: No rashes  Psychological: No history of anxiety,  No history of depression   Physical Examination  Filed Vitals:   06/01/12 1441  BP: 173/65  Pulse: 71  Temp: 98.2   F (36.8 C)  TempSrc: Oral  Height: 5' 8" (1.727 m)  Weight: 209 lb (94.802 kg)  SpO2: 100%    Body mass index is 31.79 kg/(m^2).  General:  Alert and oriented, no acute distress HEENT: Normal Neck: No bruit or JVD Pulmonary: Clear to auscultation bilaterally Cardiac: Regular Rate and Rhythm without murmur Abdomen: Soft, non-tender, non-distended, no mass Skin: No rash, gangrene nail bed of right first toe no erythema no discharge Extremity Pulses:  2+ radial, brachial, femoral, dorsalis pedis, posterior tibial pulses bilaterally Musculoskeletal: No deformity or edema, left below-knee amputation  Neurologic: Upper and lower extremity motor 5/5 and symmetric  DATA: The patient had arterial duplex exam today which are reviewed and interpreted. She suggested some mild superficial femoral occlusive disease as well as tibial artery occlusive disease ABI was 0.52 monophasic waveforms in the DP   ASSESSMENT: Severe peripheral arterial disease limb threatening ischemia   PLAN: Aortogram with lower assuring a runoff possible intervention tomorrow. The patient will be brought to the hospital early for hydration to try to avoid contrast nephropathy. Risks benefits possible complications and procedure details of arteriography intervention was explained to the patient and his wife today. They understand and agree to proceed.  Charles Fields, MD [facility] Office: [phone]     For VQI Use Only    PRE-ADM LIVING: Home  AMB STATUS: Ambulatory  CAD Sx: None  PRIOR CHF: Mild  STRESS TEST: [x ] No, [ ] Normal, [ ] + ischemia, [ ] + MI, [ ] Both   Pager: [phone]  

## 2012-06-12 NOTE — Preoperative (Signed)
Beta Blockers   Reason not to administer Beta Blockers:Not Applicable 

## 2012-06-12 NOTE — Progress Notes (Signed)
Orthopedic Tech Progress Note Patient Details:  Aaron Long [birth year] [id number] Applied Darco shoe to RLE. Taught pt. use of shoe. Ortho Devices Type of Ortho Device: Darco shoe Ortho Device/Splint Location: RLE Ortho Device/Splint Interventions: Application   Lesle Chris 06/12/2012, 2:19 PM

## 2012-06-12 NOTE — Anesthesia Procedure Notes (Signed)
Procedure Name: LMA Insertion Date/Time: 06/12/2012 7:52 AM Performed by: Gwenyth Allegra Pre-anesthesia Checklist: Patient identified, Timeout performed, Emergency Drugs available, Suction available and Patient being monitored Patient Re-evaluated:Patient Re-evaluated prior to inductionOxygen Delivery Method: Circle system utilized Preoxygenation: Pre-oxygenation with 100% oxygen Intubation Type: IV induction LMA: LMA with gastric port inserted LMA Size: 5.0 Number of attempts: 1 Placement Confirmation: breath sounds checked- equal and bilateral and positive ETCO2 Tube secured with: Tape Dental Injury: Teeth and Oropharynx as per pre-operative assessment

## 2012-06-12 NOTE — Anesthesia Preprocedure Evaluation (Signed)
Anesthesia Evaluation  Patient identified by MRN, date of birth, ID band Patient awake    Reviewed: Allergy & Precautions, H&P , NPO status , Patient's Chart, lab work & pertinent test results  Airway Mallampati: I TM Distance: >3 FB Neck ROM: Full    Dental   Pulmonary          Cardiovascular hypertension, Pt. on medications + CAD, + Past MI, + CABG and + Peripheral Vascular Disease     Neuro/Psych    GI/Hepatic   Endo/Other  diabetes, Type 2, Insulin Dependent and Oral Hypoglycemic Agents  Renal/GU Renal InsufficiencyRenal disease     Musculoskeletal   Abdominal   Peds  Hematology   Anesthesia Other Findings   Reproductive/Obstetrics                           Anesthesia Physical Anesthesia Plan  ASA: III  Anesthesia Plan: General   Post-op Pain Management:    Induction: Intravenous  Airway Management Planned: LMA  Additional Equipment:   Intra-op Plan:   Post-operative Plan: Extubation in OR  Informed Consent: I have reviewed the patients History and Physical, chart, labs and discussed the procedure including the risks, benefits and alternatives for the proposed anesthesia with the patient or authorized representative who has indicated his/her understanding and acceptance.     Plan Discussed with: CRNA and Surgeon  Anesthesia Plan Comments:         Anesthesia Quick Evaluation

## 2012-06-12 NOTE — Telephone Encounter (Signed)
4 w f/u toe amp as per staff message 06/12/12 [facility]- lm for patient, dpm

## 2012-06-13 ENCOUNTER — Encounter (HOSPITAL_COMMUNITY): Payer: Self-pay | Admitting: Vascular Surgery

## 2012-06-13 DIAGNOSIS — I70269 Atherosclerosis of native arteries of extremities with gangrene, unspecified extremity: Secondary | ICD-10-CM | POA: Diagnosis not present

## 2012-06-13 DIAGNOSIS — L97509 Non-pressure chronic ulcer of other part of unspecified foot with unspecified severity: Secondary | ICD-10-CM | POA: Diagnosis not present

## 2012-06-13 DIAGNOSIS — E1159 Type 2 diabetes mellitus with other circulatory complications: Secondary | ICD-10-CM | POA: Diagnosis not present

## 2012-06-13 DIAGNOSIS — E1149 Type 2 diabetes mellitus with other diabetic neurological complication: Secondary | ICD-10-CM | POA: Diagnosis not present

## 2012-06-13 DIAGNOSIS — E1142 Type 2 diabetes mellitus with diabetic polyneuropathy: Secondary | ICD-10-CM | POA: Diagnosis not present

## 2012-06-13 DIAGNOSIS — I129 Hypertensive chronic kidney disease with stage 1 through stage 4 chronic kidney disease, or unspecified chronic kidney disease: Secondary | ICD-10-CM | POA: Diagnosis not present

## 2012-06-13 LAB — BASIC METABOLIC PANEL
BUN: 39 mg/dL — ABNORMAL HIGH (ref 6–23)
Chloride: 103 mEq/L (ref 96–112)
GFR calc Af Amer: 21 mL/min — ABNORMAL LOW (ref >90)
Potassium: 5.4 mEq/L — ABNORMAL HIGH (ref 3.5–5.1)
Sodium: 137 mEq/L (ref 135–145)

## 2012-06-13 LAB — GLUCOSE, CAPILLARY
Glucose-Capillary: 103 mg/dL — ABNORMAL HIGH (ref 70–99)
Glucose-Capillary: 142 mg/dL — ABNORMAL HIGH (ref 70–99)
Glucose-Capillary: 214 mg/dL — ABNORMAL HIGH (ref 70–99)

## 2012-06-13 LAB — CBC
HCT: 30.4 % — ABNORMAL LOW (ref 39.0–52.0)
Hemoglobin: 9.6 g/dL — ABNORMAL LOW (ref 13.0–17.0)
RBC: 3.34 MIL/uL — ABNORMAL LOW (ref 4.22–5.81)
WBC: 7.8 10*3/uL (ref 4.0–10.5)

## 2012-06-13 MED ORDER — ATORVASTATIN CALCIUM 20 MG PO TABS
20.0000 mg | ORAL_TABLET | Freq: Every day | ORAL | Status: DC
  Filled 2012-06-13: qty 1

## 2012-06-13 NOTE — Progress Notes (Addendum)
MEDICATION RELATED CONSULT NOTE   Pharmacy Re:  Pregabalin and Simvastatin Indication:  Renal Insufficiency  Recent Labs  06/12/12 0728 06/12/12 1621 06/13/12 0519  CREATININE  --  2.73* 3.18*  Estimated Creatinine Clearance: 23.5 ml/min (by C-G formula based on Cr of 3.18).   Assessment: Pregabalin should be reduced for renal dysfunction.  See recommended dosing adjustments below... Renal impairment (CrCl 15-30 mL/min) If 150 mg/day in normal renal function: Decrease dose to 25-50 mg/day  If 300 mg/day in normal renal function: Decrease dose to 75 mg/day  If 450 mg/day in normal renal function: Decrease dose to 100-150 mg/day  If 600 mg/day in normal renal function: Decrease dose to 150 mg/day  Drug/Drug Interaction ( Amlodipine/Simvastatin) Plasma concentrations and pharmacologic effects of simvastatin may be increased by amLODipine. The daily dose of simvastatin should not exceed 20 mg in patients receiving amLODipine according to official package labeling. Plan:  1.  Consider reducing dose of Pregabalin to no more than 150mg  daily until his renal function has improved. 2.  I will change his Zocor to Lipitor 20mg  (equivalent doses) while he is hospitalized on Amlodipine, please consider this as a discharge medication alternative if you feel appropriate.  Nadara Mustard, PharmD., MS Clinical Pharmacist Pager:  [phone] Thank you for allowing pharmacy to be part of this patients care team. 06/13/2012,6:26 PM

## 2012-06-13 NOTE — Evaluation (Signed)
Physical Therapy Evaluation Patient Details Name: Aaron Long MRN: [id number] DOB: 03-17-1940 Today's Date: 06/13/2012 Time: 7829-5621 PT Time Calculation (min): 31 min  PT Assessment / Plan / Recommendation Clinical Impression   This 72 y.o. Male with h/o Lt. BKA admitted for amputation of Rt. Great toe due to PVD with gangrene.  Pt currently requires mod A to +2 A for gait.   Used an orthopedic shoe on the left until the diabetic shoe arrives. [employer] will be delivering a diabetic boot tomorrow.  Anticipate good progress once boot arrives and pt able to weight bear through Rt. LE better.  Pt is very motivated and has good assist at discharge.     PT Assessment  Patient needs continued PT services    Follow Up Recommendations  Home health PT;Supervision for mobility/OOB    Does the patient have the potential to tolerate intense rehabilitation      Barriers to Discharge        Equipment Recommendations       Recommendations for Other Services     Frequency Min 3X/week    Precautions / Restrictions Precautions Precautions: Other (comment) (Lt BKA with prosthesis) Precaution Comments: Per progress notes, do not wear Darco shoe (gait to unsteady).  Pt should wear diabetic shoe Required Braces or Orthoses: Other Brace/Splint (Diabetic shoe; Lt. Prosthesis ) Other Brace/Splint: Prosthesis Lt. LE Restrictions Weight Bearing Restrictions: No   Pertinent Vitals/Pain      Mobility  Bed Mobility Bed Mobility: Supine to Sit;Sitting - Scoot to Edge of Bed Supine to Sit: 6: Modified independent (Device/Increase time) Sitting - Scoot to Edge of Bed: 6: Modified independent (Device/Increase time) Transfers Transfers: Sit to Stand;Stand to Sit Sit to Stand: 1: +2 Total assist;With upper extremity assist;From chair/3-in-1 Sit to Stand: Patient Percentage: 50% Stand to Sit: 1: +2 Total assist;To chair/3-in-1 Stand to Sit: Patient Percentage: 60% Details for Transfer Assistance:  tendency to list posteriorly. Pt not very confident with borrowed orthopedic shoe.  Assist to help pt come forward. Ambulation/Gait Ambulation/Gait Assistance: 1: +2 Total assist Ambulation/Gait: Patient Percentage: 60% Ambulation Distance (Feet): 6 Feet (forward and back) Assistive device: Rolling walker Ambulation/Gait Assistance Details: notable lean posteriorly and not getting consistent step lengths bil due to pain on R and difficulty keeping weight back on the heel. Gait Pattern: Step-to pattern;Decreased step length - right;Decreased step length - left    Exercises     PT Diagnosis: Difficulty walking;Acute pain  PT Problem List: Decreased strength;Decreased activity tolerance;Decreased mobility;Decreased knowledge of use of DME;Pain PT Treatment Interventions: DME instruction;Gait training;Stair training;Functional mobility training;Therapeutic activities;Balance training;Patient/family education   PT Goals Acute Rehab PT Goals PT Goal Formulation: With patient Time For Goal Achievement: 06/27/12 Potential to Achieve Goals: Good Pt will go Supine/Side to Sit: with modified independence;with HOB 0 degrees PT Goal: Supine/Side to Sit - Progress: Goal set today Pt will go Sit to Stand: with min assist PT Goal: Sit to Stand - Progress: Goal set today Pt will Transfer Bed to Chair/Chair to Bed: with min assist PT Transfer Goal: Bed to Chair/Chair to Bed - Progress: Progressing toward goal Pt will Ambulate: 16 - 50 feet;with min assist;with rolling walker PT Goal: Ambulate - Progress: Goal set today Pt will Go Up / Down Stairs: 1-2 stairs;with min assist;with rail(s) PT Goal: Up/Down Stairs - Progress: Goal set today  Visit Information  Last PT Received On: 06/13/12 Assistance Needed: +2    Subjective Data  Subjective: I'm one of those guys that  just gets things done and I need to get home. Patient Stated Goal: home soon   Prior Functioning  Home Living Lives With:  Spouse Available Help at Discharge: Family;Available 24 hours/day Type of Home: Apartment Home Access: Stairs to enter Entrance Stairs-Number of Steps: 1-2 (uses back enterance with no step) Home Layout: One level Bathroom Shower/Tub: Forensic scientist: Standard Bathroom Accessibility: Yes How Accessible: Accessible via walker Home Adaptive Equipment: Straight cane;Shower chair without back;Walker - rolling Prior Function Level of Independence: Independent with assistive device(s) Able to Take Stairs?: No Driving: Yes Vocation: On disability Comments: Required assist for curb Communication Communication: No difficulties    Cognition  Cognition Arousal/Alertness: Awake/alert Behavior During Therapy: WFL for tasks assessed/performed Overall Cognitive Status: Within Functional Limits for tasks assessed    Extremity/Trunk Assessment Right Upper Extremity Assessment RUE ROM/Strength/Tone: Within functional levels RUE Coordination: WFL - gross/fine motor Left Upper Extremity Assessment LUE ROM/Strength/Tone: Within functional levels LUE Coordination: WFL - gross/fine motor Right Lower Extremity Assessment RLE ROM/Strength/Tone: Within functional levels Left Lower Extremity Assessment LLE ROM/Strength/Tone: Within functional levels Trunk Assessment Trunk Assessment: Normal   Balance Balance Balance Assessed: Yes Static Standing Balance Static Standing - Balance Support: Bilateral upper extremity supported Static Standing - Level of Assistance: 4: Min assist Static Standing - Comment/# of Minutes: standing at edge of chair working to get control of his balance  End of Session PT - End of Session Patient left: in chair;with call bell/phone within reach Nurse Communication: Mobility status  GP Functional Assessment Tool Used: clinical judgement Functional Limitation: Mobility: Walking and moving around Mobility: Walking and Moving Around Current Status  [phone]): At least 20 percent but less than 40 percent impaired, limited or restricted Mobility: Walking and Moving Around Goal Status [phone]): At least 1 percent but less than 20 percent impaired, limited or restricted   Aaron Long, Aaron Long 06/13/2012, 4:48 PM 06/13/2012  Garfield Bing, PT [phone]  (pager)

## 2012-06-13 NOTE — Evaluation (Signed)
Occupational Therapy Evaluation Patient Details Name: Aaron Long MRN: [id number] DOB: [birth year] Today's Date: 06/13/2012 Time: 8295-6213 OT Time Calculation (min): 17 min  OT Assessment / Plan / Recommendation Clinical Impression   This 72 y.o. Male with h/o Lt. BKA admitted for amputation of Rt. Great toe due to PVD with gangrene.  Pt currently requires mod A for BADLs.  Pt without shoe for Rt. LE, so activity limited to squat pivot/scoot transfer to recliner only.  Spoke with [employer] who will be delivering a diabetic boot tomorrow.  Anticipate good progress once boot arrives and pt able to weight bear through Rt. LE.  Pt is very motivated and has good assist at discharge.     OT Assessment  Patient needs continued OT Services    Follow Up Recommendations  No OT follow up;Supervision/Assistance - 24 hour    Barriers to Discharge None    Equipment Recommendations  3 in 1 bedside comode    Recommendations for Other Services    Frequency  Min 2X/week    Precautions / Restrictions Precautions Precautions: Other (comment) (Lt BKA with prosthesis) Precaution Comments: Per progress notes, do not wear Darco shoe (gait to unsteady).  Pt should wear diabetic shoe Required Braces or Orthoses: Other Brace/Splint (Diabetic shoe; Lt. Prosthesis ) Other Brace/Splint: Prosthesis Lt. LE Restrictions Weight Bearing Restrictions: No       ADL  Eating/Feeding: Independent Where Assessed - Eating/Feeding: Chair;Bed level Grooming: Shaving;Wash/dry hands;Wash/dry face;Teeth care;Set up Where Assessed - Grooming: Supported sitting Upper Body Bathing: Set up Where Assessed - Upper Body Bathing: Supported sitting Lower Body Bathing: Moderate assistance Where Assessed - Lower Body Bathing: Supported sit to stand Upper Body Dressing: Set up Where Assessed - Upper Body Dressing: Unsupported sitting;Supported sitting Lower Body Dressing: Moderate assistance Where Assessed - Lower Body  Dressing: Supported sit to stand Toilet Transfer: Minimal assistance;Simulated Statistician Method: Ambulance person: Materials engineer and Hygiene: Moderate assistance Where Assessed - Toileting Clothing Manipulation and Hygiene: Standing;Lean right and/or left Equipment Used: Other (comment) (prosthesis) Transfers/Ambulation Related to ADLs: squat pivot transfer to recliner with min A ADL Comments: Pt. Able to don Lt. prosthesis modified independently Lt. LE.  Pt unable to use Darco shoe safely and does not have his current diabetic shoes with him.  Activity was limited to squat pivot transfer only with partial WBing through Lt. heel.  As therapist was typing note, [employer] arrived and stated they will be delivering a diabetic boot tomorrow.    OT Diagnosis: Generalized weakness;Acute pain  OT Problem List: Decreased strength;Decreased activity tolerance;Impaired balance (sitting and/or standing);Decreased knowledge of use of DME or AE;Decreased knowledge of precautions;Pain OT Treatment Interventions: Self-care/ADL training;DME and/or AE instruction;Therapeutic activities;Patient/family education;Balance training   OT Goals Acute Rehab OT Goals OT Goal Formulation: With patient Time For Goal Achievement: 06/20/12 Potential to Achieve Goals: Good ADL Goals Pt Will Perform Grooming: with supervision;Standing at sink ADL Goal: Grooming - Progress: Goal set today Pt Will Perform Lower Body Bathing: with supervision;Sit to stand from chair;Sit to stand from bed ADL Goal: Lower Body Bathing - Progress: Goal set today Pt Will Perform Lower Body Dressing: with supervision;Sit to stand from chair;Sit to stand from bed ADL Goal: Lower Body Dressing - Progress: Goal set today Pt Will Transfer to Toilet: with supervision;Ambulation;Comfort height toilet ADL Goal: Toilet Transfer - Progress: Goal set today Pt Will Perform Toileting - Clothing  Manipulation: with supervision;Standing ADL Goal: Toileting - Clothing Manipulation -  Progress: Goal set today Pt Will Perform Toileting - Hygiene: with supervision;Sit to stand from 3-in-1/toilet ADL Goal: Toileting - Hygiene - Progress: Goal set today  Visit Information  Last OT Received On: 06/13/12 Assistance Needed: +2    Subjective Data  Subjective: "I don't have any shoes for this foot" Patient Stated Goal: To get better   Prior Functioning     Home Living Lives With: Spouse Available Help at Discharge: Family;Available 24 hours/day Type of Home: Apartment Home Access: Stairs to enter Entrance Stairs-Number of Steps: 1-2 (uses back enterance with no step) Home Layout: One level Bathroom Shower/Tub: Forensic scientist: Standard Bathroom Accessibility: Yes How Accessible: Accessible via walker Home Adaptive Equipment: Straight cane;Shower chair without back;Walker - rolling Prior Function Level of Independence: Independent with assistive device(s) Able to Take Stairs?: No Driving: Yes Vocation: On disability Comments: Required assist for curb Communication Communication: No difficulties         Vision/Perception Vision - History Baseline Vision: Wears glasses all the time Patient Visual Report: No change from baseline   Cognition  Cognition Arousal/Alertness: Awake/alert Behavior During Therapy: WFL for tasks assessed/performed Overall Cognitive Status: Within Functional Limits for tasks assessed    Extremity/Trunk Assessment Right Upper Extremity Assessment RUE ROM/Strength/Tone: Within functional levels RUE Coordination: WFL - gross/fine motor Left Upper Extremity Assessment LUE ROM/Strength/Tone: Within functional levels LUE Coordination: WFL - gross/fine motor Trunk Assessment Trunk Assessment: Normal     Mobility Bed Mobility Bed Mobility: Supine to Sit;Sitting - Scoot to Edge of Bed Supine to Sit: 6: Modified independent  (Device/Increase time) Sitting - Scoot to Edge of Bed: 6: Modified independent (Device/Increase time) Transfers Transfers: Not assessed     Exercise     Balance     End of Session OT - End of Session Activity Tolerance: Patient tolerated treatment well Patient left: in chair;with call bell/phone within reach Nurse Communication: Mobility status  GO Functional Limitation: Self care Self Care Current Status ([id number]): At least 40 percent but less than 60 percent impaired, limited or restricted Self Care Goal Status ([id number]): At least 1 percent but less than 20 percent impaired, limited or restricted   Mustaf Antonacci M 06/13/2012, 3:07 PM

## 2012-06-13 NOTE — Progress Notes (Addendum)
[facility] [place]  Subjective  - His right foot feels fine.   Objective 182/86 80 98.4 F (36.9 C) (Oral) 18 100%  Intake/Output Summary (Last 24 hours) at 06/13/12 0919 Last data filed at 06/13/12 0850  Gross per 24 hour  Intake   1050 ml  Output    900 ml  Net    150 ml    Right foot dressing clean and dry No active bleeding through the dressing  Assessment/Planning: POD #1  He has a history of chronic kidney disease and he has been in the hospital with hyperkalemia previously.  K 5.4 today and his CR 3.8.  Urine output was 600 yesterday and 300 today since 7 am. I will discuss plan of care with Dr. Darrick Penna We may need a nephrology consult.  [facility] 06/13/2012 9:19 AM --  Laboratory Lab Results:  Recent Labs  06/12/12 1621 06/13/12 0519  WBC 5.9 7.8  HGB 9.5* 9.6*  HCT 29.6* 30.4*  PLT 229 242   BMET  Recent Labs  06/12/12 0728 06/12/12 1621 06/13/12 0519  NA 140  --  137  K 5.2*  --  5.4*  CL  --   --  103  CO2  --   --  25  GLUCOSE 140*  --  208*  BUN  --   --  39*  CREATININE  --  2.73* 3.18*  CALCIUM  --   --  8.7    COAG Lab Results  Component Value Date   INR 1.02 01/31/2012   INR 1.98* 01/28/2012   INR 2.59* 07/01/2011   No results found for this basename: PTT    Pain reasonably controlled Baseline poor renal function but slightly hyperkalemic today Will encourage PO hydration D.c tomorrow if potassium improved baseline creatinine is 2-3 NO Darco shoe.  His gait is too unstable and this will cause fall risk Diabetic protective shoe only Change dressing in am  Fabienne Bruns, MD [facility] Office: [phone] Pager: [phone]

## 2012-06-14 ENCOUNTER — Encounter (HOSPITAL_COMMUNITY): Payer: Self-pay | Admitting: General Practice

## 2012-06-14 DIAGNOSIS — I129 Hypertensive chronic kidney disease with stage 1 through stage 4 chronic kidney disease, or unspecified chronic kidney disease: Secondary | ICD-10-CM | POA: Diagnosis not present

## 2012-06-14 DIAGNOSIS — L97509 Non-pressure chronic ulcer of other part of unspecified foot with unspecified severity: Secondary | ICD-10-CM | POA: Diagnosis not present

## 2012-06-14 DIAGNOSIS — E1142 Type 2 diabetes mellitus with diabetic polyneuropathy: Secondary | ICD-10-CM | POA: Diagnosis not present

## 2012-06-14 DIAGNOSIS — E1159 Type 2 diabetes mellitus with other circulatory complications: Secondary | ICD-10-CM | POA: Diagnosis not present

## 2012-06-14 DIAGNOSIS — E1149 Type 2 diabetes mellitus with other diabetic neurological complication: Secondary | ICD-10-CM | POA: Diagnosis not present

## 2012-06-14 DIAGNOSIS — I70269 Atherosclerosis of native arteries of extremities with gangrene, unspecified extremity: Secondary | ICD-10-CM | POA: Diagnosis not present

## 2012-06-14 LAB — CBC
Hemoglobin: 9.5 g/dL — ABNORMAL LOW (ref 13.0–17.0)
MCH: 29.1 pg (ref 26.0–34.0)
RBC: 3.27 MIL/uL — ABNORMAL LOW (ref 4.22–5.81)

## 2012-06-14 LAB — BASIC METABOLIC PANEL
GFR calc non Af Amer: 19 mL/min — ABNORMAL LOW (ref >90)
Glucose, Bld: 141 mg/dL — ABNORMAL HIGH (ref 70–99)
Potassium: 5.4 mEq/L — ABNORMAL HIGH (ref 3.5–5.1)
Sodium: 136 mEq/L (ref 135–145)

## 2012-06-14 LAB — GLUCOSE, CAPILLARY: Glucose-Capillary: 82 mg/dL (ref 70–99)

## 2012-06-14 MED ORDER — OXYCODONE HCL 5 MG PO TABS: 5.0000 mg | ORAL_TABLET | ORAL | Status: DC | PRN

## 2012-06-14 MED ORDER — OXYCODONE HCL 5 MG PO TABS: ORAL_TABLET | ORAL | Status: DC

## 2012-06-14 NOTE — Progress Notes (Signed)
Physical Therapy Treatment Patient Details Name: Aaron Long MRN: [id number] DOB: 05/28/1940 Today's Date: 06/14/2012 Time: 1132-1207 PT Time Calculation (min): 35 min  PT Assessment / Plan / Recommendation Comments on Treatment Session  pt's wife assisted pt in today's session so she could feel how pt was doing.  She had difficulty helping him walk, but she and pt did well with transfers to/from w/c.[name]  Getting BSC for home due to pt with difficulty walking so can not access bathroom well.    Follow Up Recommendations  Home health PT;Supervision for mobility/OOB     Does the patient have the potential to tolerate intense rehabilitation     Barriers to Discharge        Equipment Recommendations  Other (comment) (3 in 1)    Recommendations for Other Services    Frequency Min 3X/week   Plan Discharge plan remains appropriate    Precautions / Restrictions Precautions Other Brace/Splint: Prosthesis Lt. LE Restrictions RLE Weight Bearing: Weight bearing as tolerated   Pertinent Vitals/Pain     Mobility  Bed Mobility Bed Mobility: Not assessed Transfers Transfers: Sit to Stand;Stand to Sit;Squat Pivot Transfers Sit to Stand: 3: Mod assist;With upper extremity assist;From bed;From chair/3-in-1 Sit to Stand: Patient Percentage: 50% (to 60%) Stand to Sit: To chair/3-in-1;3: Mod assist;With upper extremity assist Squat Pivot Transfers: 4: Min guard Details for Transfer Assistance: tendency to moderately lean posteriorly.  Wife assisted pt to come forward. Ambulation/Gait Ambulation/Gait Assistance: 3: Mod assist Ambulation/Gait: Patient Percentage: 50% Ambulation Distance (Feet): 6 Feet (with wife assisting) Assistive device: Rolling walker Ambulation/Gait Assistance Details: moderate posterior lean, with little ability to propel forward unless assist by caregiver.  Wife had some trouble helping him due to difficulty getting hime to advance forward.  Pt comfortable in the  diabetic shoe Gait Pattern: Step-to pattern;Decreased step length - right;Decreased step length - left Stairs: No    Exercises     PT Diagnosis:    PT Problem List:   PT Treatment Interventions:     PT Goals Acute Rehab PT Goals PT Goal Formulation: With patient Potential to Achieve Goals: Good Pt will go Supine/Side to Sit: with modified independence;with HOB 0 degrees PT Goal: Supine/Side to Sit - Progress: Met Pt will go Sit to Stand: with min assist PT Goal: Sit to Stand - Progress: Progressing toward goal Pt will Transfer Bed to Chair/Chair to Bed: with min assist PT Transfer Goal: Bed to Chair/Chair to Bed - Progress: Met Pt will Ambulate: 16 - 50 feet;with min assist;with rolling walker PT Goal: Ambulate - Progress: Progressing toward goal Pt will Go Up / Down Stairs: 1-2 stairs;with min assist;with rail(s) PT Goal: Up/Down Stairs - Progress: Progressing toward goal  Visit Information  Last PT Received On: 06/14/12 Assistance Needed: +2    Subjective Data  Subjective: I can't balance and keep my weight off the toe at the same time.   Cognition  Cognition Arousal/Alertness: Awake/alert Behavior During Therapy: WFL for tasks assessed/performed Overall Cognitive Status: Within Functional Limits for tasks assessed    Balance  Balance Balance Assessed: Yes Static Standing Balance Static Standing - Balance Support: Bilateral upper extremity supported Static Standing - Level of Assistance: 4: Min assist;3: Mod assist;Other (comment) (with wife helping) Dynamic Standing Balance Dynamic Standing - Balance Support: Bilateral upper extremity supported;During functional activity Dynamic Standing - Level of Assistance: 3: Mod assist  End of Session PT - End of Session Activity Tolerance: Patient tolerated treatment well Patient left: in chair;with  call bell/phone within reach Nurse Communication: Mobility status   GP Mobility: Walking and Moving Around Discharge Status  [phone]): At least 20 percent but less than 40 percent impaired, limited or restricted   Aaron Long, Aaron Long 06/14/2012, 12:22 PM 06/14/2012  Aaron Long, PT [phone]  (pager)

## 2012-06-14 NOTE — Progress Notes (Addendum)
[facility] [place]  Postoperative Visit - Amputation  Date of Surgery: 06/12/2012 Procedure(s): AMPUTATION DIGIT Right Great toe Surgeon: Surgeon(s): Sherren Kerns, MD POD: 2 Days Post-Op  Subjective Aaron Long is a 72 y.o. male who is S/P Right great toe amputation Pt.denies increased pain in the stump. The patient notes pain is well controlled. Pt. Has diabetic neuropathy - well controlled with Lyrica.  Wife requests Carb Mod diet  Significant Diagnostic Studies: CBC Lab Results  Component Value Date   WBC 8.7 06/14/2012   HGB 9.5* 06/14/2012   HCT 29.5* 06/14/2012   MCV 90.2 06/14/2012   PLT 255 06/14/2012    BMET    Component Value Date/Time   NA 136 06/14/2012 0508   NA 143 05/23/2012 1250   K 5.4* 06/14/2012 0508   CL 106 06/14/2012 0508   CO2 23 06/14/2012 0508   GLUCOSE 141* 06/14/2012 0508   GLUCOSE 84 05/23/2012 1250   BUN 40* 06/14/2012 0508   BUN 39* 05/23/2012 1250   CREATININE 3.05* 06/14/2012 0508   CALCIUM 9.0 06/14/2012 0508   GFRNONAA 19* 06/14/2012 0508   GFRAA 22* 06/14/2012 0508    COAG Lab Results  Component Value Date   INR 1.02 01/31/2012   INR 1.98* 01/28/2012   INR 2.59* 07/01/2011   No results found for this basename: PTT     Intake/Output Summary (Last 24 hours) at 06/14/12 0907 Last data filed at 06/14/12 0606  Gross per 24 hour  Intake    240 ml  Output    550 ml  Net   -310 ml   No data found.    Physical Examination  BP Readings from Last 3 Encounters:  06/14/12 186/70  06/14/12 186/70  06/09/12 172/73   Temp Readings from Last 3 Encounters:  06/14/12 98.6 F (37 C) Oral  06/14/12 98.6 F (37 C) Oral  06/09/12 98.8 F (37.1 C) Oral   SpO2 Readings from Last 3 Encounters:  06/14/12 100%  06/14/12 100%  06/09/12 99%   Pulse Readings from Last 3 Encounters:  06/14/12 89  06/14/12 89  06/09/12 79    Pt is A&Ox3  WDWN male with no complaints  Right amputation wound is healing well.  There is good bone  coverage in the stump - sutures intact Stump is warm and well perfused, without drainage; without erythema   Assessment/plan:  Aaron Long is a 72 y.o. male who is s/p Right great toe AMPUTATION  CRI - Cr stable at 3.05 - sl down from yesterday K+ 5.4 - unchanged DM - change diet to carb modified  [name] stump is viable.  Follow-up 2-4 weeks from surgery  ROCZNIAK,REGINA J 9:07 AM 06/14/2012   Renal function overall stable Pain controlled Has follow up with Nephrology scheduled D/c home today If diabetic protective shoe has not arrived ok to d/c home and he can pick it up from [employer] himself  Fabienne Bruns, MD [facility] Office: [phone] Pager: [phone]

## 2012-06-14 NOTE — Progress Notes (Signed)
Occupational Therapy Treatment Patient Details Name: Aaron Long MRN: [id number] DOB: May 14, 1940 Today's Date: 06/14/2012 Time: 4098-1191 OT Time Calculation (min): 18 min  OT Assessment / Plan / Recommendation Comments on Treatment Session  Pt with decreased standing balance and decreased independence with BADLs.  Pt reports he feels confident with current status and their ability to manage at home.  Can use wheelchair in apartment.  Currently, pt requires mod A with BADLs.   No OT follow up recommended as PT will address balance and ambulation at home.     Follow Up Recommendations  No OT follow up;Supervision/Assistance - 24 hour    Barriers to Discharge       Equipment Recommendations  3 in 1 bedside comode    Recommendations for Other Services    Frequency Min 2X/week   Plan Discharge plan remains appropriate    Precautions / Restrictions Precautions Precautions: Other (comment) (Lt BKA with prosthesis) Precaution Comments: Per progress notes, do not wear Darco shoe (gait to unsteady).  Pt should wear diabetic shoe Required Braces or Orthoses: Other Brace/Splint (Diabetic shoe; Lt. Prosthesis ) Other Brace/Splint: Prosthesis Lt. LE Restrictions Weight Bearing Restrictions: Yes RLE Weight Bearing: Weight bearing as tolerated   Pertinent Vitals/Pain     ADL  Lower Body Dressing: Moderate assistance Where Assessed - Lower Body Dressing: Supported sit to Pharmacist, hospital: Moderate assistance;Simulated Toilet Transfer Method: Surveyor, minerals: Materials engineer and Hygiene: Simulated;Minimal assistance Where Assessed - Engineer, mining and Hygiene: Standing Equipment Used: Rolling walker Transfers/Ambulation Related to ADLs: Pt moves sit to stand with mod A first attempt, then min A second attempt.  Ambulated small distance with min A ADL Comments: Worked on standing balance and moving sit to  stand safely.  Pt demonstrates a posterior lean with difficulty translating weight forward.  Pt states they will use wheelchair to get into apt.      OT Diagnosis:    OT Problem List:   OT Treatment Interventions:     OT Goals Acute Rehab OT Goals Time For Goal Achievement: 06/20/12 ADL Goals Pt Will Perform Grooming: with supervision;Standing at sink Pt Will Perform Lower Body Bathing: with supervision;Sit to stand from chair;Sit to stand from bed Pt Will Perform Lower Body Dressing: with supervision;Sit to stand from chair;Sit to stand from bed ADL Goal: Lower Body Dressing - Progress: Progressing toward goals Pt Will Transfer to Toilet: with supervision;Ambulation;Comfort height toilet ADL Goal: Toilet Transfer - Progress: Progressing toward goals Pt Will Perform Toileting - Clothing Manipulation: with supervision;Standing ADL Goal: Toileting - Clothing Manipulation - Progress: Progressing toward goals Pt Will Perform Toileting - Hygiene: with supervision;Sit to stand from 3-in-1/toilet  Visit Information  Last OT Received On: 06/14/12 Assistance Needed: +1    Subjective Data      Prior Functioning       Cognition  Cognition Arousal/Alertness: Awake/alert Behavior During Therapy: WFL for tasks assessed/performed Overall Cognitive Status: Within Functional Limits for tasks assessed    Mobility  Bed Mobility Bed Mobility: Not assessed Transfers Transfers: Sit to Stand;Stand to Sit Sit to Stand: 3: Mod assist;4: Min assist;With upper extremity assist;From bed Sit to Stand: Patient Percentage: 50% (to 60%) Stand to Sit: 4: Min assist;With upper extremity assist;To chair/3-in-1 Details for Transfer Assistance: Pt initially required mod A due to posterior bias.  Second attempt, required min A.  Reinforced need to position feet correctly.       Exercises  Balance Balance Balance Assessed: Yes Static Standing Balance Static Standing - Balance Support: Bilateral  upper extremity supported Static Standing - Level of Assistance: 4: Min assist;3: Mod assist;Other (comment) (with wife helping) Dynamic Standing Balance Dynamic Standing - Balance Support: Bilateral upper extremity supported;During functional activity Dynamic Standing - Level of Assistance: 3: Mod assist   End of Session OT - End of Session Activity Tolerance: Patient tolerated treatment well Patient left: in chair;with call bell/phone within reach;with family/visitor present  GO     Aaron Long M 06/14/2012, 2:41 PM

## 2012-06-14 NOTE — Progress Notes (Signed)
DC HOME WITH WIFE, VERBALLY UNDERSTOOD DC INSTRUCTIONS, NO QUESTIONS ASKED 

## 2012-06-15 ENCOUNTER — Telehealth: Payer: Self-pay

## 2012-06-15 DIAGNOSIS — I2510 Atherosclerotic heart disease of native coronary artery without angina pectoris: Secondary | ICD-10-CM | POA: Diagnosis not present

## 2012-06-15 DIAGNOSIS — S98139A Complete traumatic amputation of one unspecified lesser toe, initial encounter: Secondary | ICD-10-CM | POA: Diagnosis not present

## 2012-06-15 DIAGNOSIS — S88119A Complete traumatic amputation at level between knee and ankle, unspecified lower leg, initial encounter: Secondary | ICD-10-CM | POA: Diagnosis not present

## 2012-06-15 DIAGNOSIS — Z794 Long term (current) use of insulin: Secondary | ICD-10-CM | POA: Diagnosis not present

## 2012-06-15 DIAGNOSIS — E119 Type 2 diabetes mellitus without complications: Secondary | ICD-10-CM | POA: Diagnosis not present

## 2012-06-15 DIAGNOSIS — Z4789 Encounter for other orthopedic aftercare: Secondary | ICD-10-CM | POA: Diagnosis not present

## 2012-06-15 DIAGNOSIS — I129 Hypertensive chronic kidney disease with stage 1 through stage 4 chronic kidney disease, or unspecified chronic kidney disease: Secondary | ICD-10-CM | POA: Diagnosis not present

## 2012-06-15 DIAGNOSIS — N189 Chronic kidney disease, unspecified: Secondary | ICD-10-CM | POA: Diagnosis not present

## 2012-06-15 NOTE — Discharge Summary (Signed)
[facility] Discharge Summary   Patient ID:  Aaron Long MRN: [id number] DOB/AGE: October 11, 1940 72 y.o.  Admit date: 06/12/2012 Discharge date: 06/15/2012 Date of Surgery: 06/12/2012 Surgeon: Surgeon(s): Sherren Kerns, MD  Admission Diagnosis: PVD WITH GANGRENE TOE  Discharge Diagnoses:  PVD WITH GANGRENE TOE  Secondary Diagnoses: Past Medical History  Diagnosis Date  . Prostate cancer 2010  . PAD (peripheral artery disease)   . Gangrene of toe     dry  . Neuromuscular disorder     diabetic neuropathy  . Hypertension     takes Amlodipine daily and Metoprolol bid  . Hyperlipidemia     takes Pravastatin daily  . Myocardial infarction 2005  . Peripheral neuropathy   . Constipation     takes Miralax daily  . Hemorrhoids   . Hx of colonic polyps   . History of kidney stones   . Diabetes mellitus     Novolog and Levemir daily  . Coronary artery disease   . BPH (benign prostatic hypertrophy)   . Abnormality of gait   . Coronary atherosclerosis of native coronary artery   . Phlebitis and thrombophlebitis of other deep vessels of lower extremities   . Chronic kidney disease   . Hypertrophy of prostate without urinary obstruction and other lower urinary tract symptoms (LUTS)   . Disorder of bone and cartilage, unspecified   . Lower limb amputation, below knee   . Long term (current) use of anticoagulants     Procedure(s): AMPUTATION DIGIT  Discharged Condition: good  HPI:  Patient is a 72 y.o. year old male who presents for evaluation of a nonhealing right first toe. The patient is seen at the request of Dr. Elvin So. He stated that the toe was rubbing on his shoe. The wound is now been present for approximately 2 months. He thinks it may be slightly improved. He is currently on Keflex. He denies any pain in the toe. He has previous had a left below-knee amputation. He does have a history of renal insufficiency.. Other medical problems include  peripheral arterial disease, neuropathy, hypertension, diabetes, coronary disease. He is on a statin and aspirin. Pt had angiogram which showed severe tibial disease. He then had Right lower extremity arteriogram angioplasty of right tibio-peroneal trunk on 06/09/12 to establish better flow to foot. He is now admitted for right great toe amputation   Hospital Course:  NATHANIE Long is a 72 y.o. male is S/P Right great toe AMPUTATION DIGIT Extubated: POD # 0 Physical exam: right foot warm with monophasic doppler flow to foot Post-op wounds healing well Pt. Ambulating, voiding and taking PO diet without difficulty. Pt pain controlled with PO pain meds. Labs as below Complications:none  Consults:     Significant Diagnostic Studies: CBC Lab Results  Component Value Date   WBC 8.7 06/14/2012   HGB 9.5* 06/14/2012   HCT 29.5* 06/14/2012   MCV 90.2 06/14/2012   PLT 255 06/14/2012    BMET    Component Value Date/Time   NA 136 06/14/2012 0508   NA 143 05/23/2012 1250   K 5.4* 06/14/2012 0508   CL 106 06/14/2012 0508   CO2 23 06/14/2012 0508   GLUCOSE 141* 06/14/2012 0508   GLUCOSE 84 05/23/2012 1250   BUN 40* 06/14/2012 0508   BUN 39* 05/23/2012 1250   CREATININE 3.05* 06/14/2012 0508   CALCIUM 9.0 06/14/2012 0508   GFRNONAA 19* 06/14/2012 0508   GFRAA 22* 06/14/2012 0508   COAG Lab  Results  Component Value Date   INR 1.02 01/31/2012   INR 1.98* 01/28/2012   INR 2.59* 07/01/2011     Disposition:  Discharge to :Home Discharge Orders   Future Appointments Provider Department Dept Phone   06/20/2012 3:15 PM Mahima Glade Lloyd, MD [facility] [phone]   07/20/2012 10:15 AM Sherren Kerns, MD [facility] [phone]   08/22/2012 11:30 AM Oneal Grout, MD [facility] [phone]   Future Orders Complete By Expires     Call MD for:  redness, tenderness, or signs of infection (pain, swelling, bleeding, redness, odor or green/yellow discharge around  incision site)  As directed     Call MD for:  severe or increased pain, loss or decreased feeling  in affected limb(s)  As directed     Call MD for:  temperature >100.5  As directed     Driving Restrictions  As directed     Comments:      No driving    Increase activity slowly  As directed     Comments:      Walk with assistance use walker or cane as needed    Resume previous diet  As directed     Walker   As directed         Medication List    TAKE these medications       amLODipine 10 MG tablet  Commonly known as:  NORVASC  Take 5 mg by mouth daily.     aspirin EC 81 MG tablet  Take 81 mg by mouth every morning.     cephALEXin 500 MG capsule  Commonly known as:  KEFLEX  Take 1,000 mg by mouth 2 (two) times daily.     clopidogrel 75 MG tablet  Commonly known as:  PLAVIX  Take 1 tablet (75 mg total) by mouth daily.     docusate sodium 100 MG capsule  Commonly known as:  COLACE  Take 100 mg by mouth 2 (two) times daily.     doxazosin 1 MG tablet  Commonly known as:  CARDURA  Take 0.5 mg by mouth at bedtime. Take 1/2 tablet by mouth at bedtime for blood pressure     LEVEMIR FLEXPEN 100 UNIT/ML injection  Generic drug:  insulin detemir  Inject 18 Units into the skin at bedtime.     NOVOLOG FLEXPEN 100 UNIT/ML injection  Generic drug:  insulin aspart  Inject 0-5 Units into the skin 3 (three) times daily before meals. Sliding scale 0 units=below 150 anything greater 5 units     oxyCODONE 5 MG immediate release tablet  Commonly known as:  Oxy IR/ROXICODONE  Take 1-2 tablets (5-10 mg total) by mouth every 4 (four) hours as needed for pain.     polyethylene glycol packet  Commonly known as:  MIRALAX / GLYCOLAX  Take 17 g by mouth daily.     pravastatin 80 MG tablet  Commonly known as:  PRAVACHOL  Take 80 mg by mouth every morning.     pregabalin 200 MG capsule  Commonly known as:  LYRICA  Take 200 mg by mouth 2 (two) times daily.       Verbal and written  Discharge instructions given to the patient. Wound care per Discharge AVS     Follow-up Information   Follow up with Sherren Kerns, MD In 4 weeks. (office will arrange - sent)    Contact information:   [address] [place] [address] [phone]  Follow up with Oneal Grout, MD.   Contact information:   [address] [place] [address] [phone]       Signed: Marlowe Shores 06/15/2012, 8:23 AM

## 2012-06-15 NOTE — Telephone Encounter (Signed)
Pt's wife called.  Reported that pt. Started having tremors of upper extremities yesterday afternoon, and this has continued intermittently.  States he occasionally jerks with the tremors.  States pt. had fever of 101 degrees earlier today, when the [facility] nurse was there.  Gave pt. Tylenol 650 mg. at 1:15 PM, and now fever down to 99.0 degrees.  States pt. was a little confused earlier today.   Also reports HR in the 90-100 range.  Wife voiced concern that pt. Could be exhibiting symptoms of withdrawal from the Morphine.  Discussed w/ Dr. Darrick Penna.  Advised to continue to monitor, and give Tylenol for fever.  Stated it is unlikely the symptoms are due to MSO4 withdrawal, without hx. of heavy Morphine use.  Advised to have wife take pt. to the ER if symptoms worsen.   Called wife back and informed of Dr. Darrick Penna response/ recommendations.  Verb. Understanding.  Stated pt. is feeling better at this time.

## 2012-06-17 ENCOUNTER — Emergency Department (HOSPITAL_COMMUNITY)
Admission: EM | Admit: 2012-06-17 | Discharge: 2012-06-18 | Disposition: A | Discharge status: Home / Self Care | Payer: Medicare Other | Source: Home / Self Care | Attending: Emergency Medicine | Admitting: Emergency Medicine

## 2012-06-17 ENCOUNTER — Encounter (HOSPITAL_COMMUNITY): Payer: Self-pay | Admitting: Emergency Medicine

## 2012-06-17 DIAGNOSIS — IMO0002 Reserved for concepts with insufficient information to code with codable children: Secondary | ICD-10-CM

## 2012-06-17 DIAGNOSIS — D509 Iron deficiency anemia, unspecified: Secondary | ICD-10-CM | POA: Diagnosis present

## 2012-06-17 DIAGNOSIS — Z7902 Long term (current) use of antithrombotics/antiplatelets: Secondary | ICD-10-CM

## 2012-06-17 DIAGNOSIS — Z860100 Personal history of colon polyps, unspecified: Secondary | ICD-10-CM

## 2012-06-17 DIAGNOSIS — F17200 Nicotine dependence, unspecified, uncomplicated: Secondary | ICD-10-CM | POA: Insufficient documentation

## 2012-06-17 DIAGNOSIS — E1165 Type 2 diabetes mellitus with hyperglycemia: Secondary | ICD-10-CM | POA: Diagnosis present

## 2012-06-17 DIAGNOSIS — E1142 Type 2 diabetes mellitus with diabetic polyneuropathy: Secondary | ICD-10-CM | POA: Diagnosis present

## 2012-06-17 DIAGNOSIS — I129 Hypertensive chronic kidney disease with stage 1 through stage 4 chronic kidney disease, or unspecified chronic kidney disease: Secondary | ICD-10-CM | POA: Diagnosis present

## 2012-06-17 DIAGNOSIS — E785 Hyperlipidemia, unspecified: Secondary | ICD-10-CM | POA: Diagnosis present

## 2012-06-17 DIAGNOSIS — N189 Chronic kidney disease, unspecified: Secondary | ICD-10-CM | POA: Insufficient documentation

## 2012-06-17 DIAGNOSIS — Z79899 Other long term (current) drug therapy: Secondary | ICD-10-CM | POA: Diagnosis not present

## 2012-06-17 DIAGNOSIS — Z7901 Long term (current) use of anticoagulants: Secondary | ICD-10-CM | POA: Insufficient documentation

## 2012-06-17 DIAGNOSIS — S98119A Complete traumatic amputation of unspecified great toe, initial encounter: Secondary | ICD-10-CM | POA: Diagnosis not present

## 2012-06-17 DIAGNOSIS — Z951 Presence of aortocoronary bypass graft: Secondary | ICD-10-CM | POA: Diagnosis not present

## 2012-06-17 DIAGNOSIS — Z792 Long term (current) use of antibiotics: Secondary | ICD-10-CM | POA: Diagnosis not present

## 2012-06-17 DIAGNOSIS — I96 Gangrene, not elsewhere classified: Secondary | ICD-10-CM | POA: Diagnosis not present

## 2012-06-17 DIAGNOSIS — Z452 Encounter for adjustment and management of vascular access device: Secondary | ICD-10-CM | POA: Diagnosis not present

## 2012-06-17 DIAGNOSIS — Z87891 Personal history of nicotine dependence: Secondary | ICD-10-CM | POA: Diagnosis not present

## 2012-06-17 DIAGNOSIS — L02619 Cutaneous abscess of unspecified foot: Secondary | ICD-10-CM | POA: Diagnosis not present

## 2012-06-17 DIAGNOSIS — I2510 Atherosclerotic heart disease of native coronary artery without angina pectoris: Secondary | ICD-10-CM | POA: Diagnosis present

## 2012-06-17 DIAGNOSIS — N289 Disorder of kidney and ureter, unspecified: Secondary | ICD-10-CM | POA: Diagnosis not present

## 2012-06-17 DIAGNOSIS — Z4789 Encounter for other orthopedic aftercare: Secondary | ICD-10-CM | POA: Diagnosis not present

## 2012-06-17 DIAGNOSIS — I252 Old myocardial infarction: Secondary | ICD-10-CM | POA: Diagnosis not present

## 2012-06-17 DIAGNOSIS — R6889 Other general symptoms and signs: Secondary | ICD-10-CM | POA: Diagnosis not present

## 2012-06-17 DIAGNOSIS — D631 Anemia in chronic kidney disease: Secondary | ICD-10-CM | POA: Diagnosis present

## 2012-06-17 DIAGNOSIS — E119 Type 2 diabetes mellitus without complications: Secondary | ICD-10-CM | POA: Insufficient documentation

## 2012-06-17 DIAGNOSIS — M6281 Muscle weakness (generalized): Secondary | ICD-10-CM | POA: Diagnosis not present

## 2012-06-17 DIAGNOSIS — S88119A Complete traumatic amputation at level between knee and ankle, unspecified lower leg, initial encounter: Secondary | ICD-10-CM

## 2012-06-17 DIAGNOSIS — Z8546 Personal history of malignant neoplasm of prostate: Secondary | ICD-10-CM

## 2012-06-17 DIAGNOSIS — L98499 Non-pressure chronic ulcer of skin of other sites with unspecified severity: Secondary | ICD-10-CM | POA: Diagnosis not present

## 2012-06-17 DIAGNOSIS — N184 Chronic kidney disease, stage 4 (severe): Secondary | ICD-10-CM | POA: Diagnosis present

## 2012-06-17 DIAGNOSIS — E1149 Type 2 diabetes mellitus with other diabetic neurological complication: Secondary | ICD-10-CM | POA: Insufficient documentation

## 2012-06-17 DIAGNOSIS — I739 Peripheral vascular disease, unspecified: Secondary | ICD-10-CM | POA: Diagnosis present

## 2012-06-17 DIAGNOSIS — R404 Transient alteration of awareness: Secondary | ICD-10-CM | POA: Diagnosis not present

## 2012-06-17 DIAGNOSIS — E1059 Type 1 diabetes mellitus with other circulatory complications: Secondary | ICD-10-CM | POA: Diagnosis not present

## 2012-06-17 DIAGNOSIS — Z794 Long term (current) use of insulin: Secondary | ICD-10-CM | POA: Insufficient documentation

## 2012-06-17 DIAGNOSIS — R5381 Other malaise: Secondary | ICD-10-CM | POA: Diagnosis not present

## 2012-06-17 DIAGNOSIS — K5900 Constipation, unspecified: Secondary | ICD-10-CM | POA: Diagnosis present

## 2012-06-17 DIAGNOSIS — Z7982 Long term (current) use of aspirin: Secondary | ICD-10-CM | POA: Diagnosis not present

## 2012-06-17 DIAGNOSIS — R269 Unspecified abnormalities of gait and mobility: Secondary | ICD-10-CM | POA: Diagnosis not present

## 2012-06-17 DIAGNOSIS — D649 Anemia, unspecified: Secondary | ICD-10-CM | POA: Diagnosis not present

## 2012-06-17 DIAGNOSIS — E1129 Type 2 diabetes mellitus with other diabetic kidney complication: Secondary | ICD-10-CM | POA: Diagnosis present

## 2012-06-17 DIAGNOSIS — N058 Unspecified nephritic syndrome with other morphologic changes: Secondary | ICD-10-CM | POA: Diagnosis present

## 2012-06-17 DIAGNOSIS — G909 Disorder of the autonomic nervous system, unspecified: Secondary | ICD-10-CM | POA: Insufficient documentation

## 2012-06-17 DIAGNOSIS — C61 Malignant neoplasm of prostate: Secondary | ICD-10-CM | POA: Insufficient documentation

## 2012-06-17 DIAGNOSIS — N039 Chronic nephritic syndrome with unspecified morphologic changes: Secondary | ICD-10-CM | POA: Diagnosis present

## 2012-06-17 DIAGNOSIS — R509 Fever, unspecified: Secondary | ICD-10-CM

## 2012-06-17 DIAGNOSIS — S98139A Complete traumatic amputation of one unspecified lesser toe, initial encounter: Secondary | ICD-10-CM | POA: Diagnosis not present

## 2012-06-17 DIAGNOSIS — R109 Unspecified abdominal pain: Secondary | ICD-10-CM | POA: Diagnosis not present

## 2012-06-17 DIAGNOSIS — L0390 Cellulitis, unspecified: Secondary | ICD-10-CM | POA: Diagnosis not present

## 2012-06-17 DIAGNOSIS — N183 Chronic kidney disease, stage 3 unspecified: Secondary | ICD-10-CM | POA: Diagnosis not present

## 2012-06-17 DIAGNOSIS — I10 Essential (primary) hypertension: Secondary | ICD-10-CM | POA: Insufficient documentation

## 2012-06-17 DIAGNOSIS — E1169 Type 2 diabetes mellitus with other specified complication: Secondary | ICD-10-CM | POA: Diagnosis not present

## 2012-06-17 DIAGNOSIS — L03119 Cellulitis of unspecified part of limb: Principal | ICD-10-CM | POA: Diagnosis present

## 2012-06-17 DIAGNOSIS — I70269 Atherosclerosis of native arteries of extremities with gangrene, unspecified extremity: Secondary | ICD-10-CM | POA: Diagnosis not present

## 2012-06-17 NOTE — ED Provider Notes (Signed)
History     CSN: [id number]  Arrival date & time 06/17/12  2239   First MD Initiated Contact with Patient 06/17/12 2344      Chief Complaint  Patient presents with  . Fever    (Consider location/radiation/quality/duration/timing/severity/associated sxs/prior treatment) HPI Comments: Patient with history of IDDM, PVD, status post right great toe amputation on 6/3.  Presents with complaints of fever off and on for the past two days.  He denies any specific complaints such as cough, abd pain, diarrhea, dysuria.  He does have some pain in the right foot at the site of the amputation, but denies that it is significantly worse than before.  Patient is a 72 y.o. male presenting with fever. The history is provided by the patient.  Fever Max temp prior to arrival:  102 Temp source:  Oral Severity:  Moderate Onset quality:  Gradual Duration:  3 days Timing:  Intermittent Progression:  Unchanged Chronicity:  New Relieved by:  Nothing Worsened by:  Nothing tried Ineffective treatments:  None tried Associated symptoms: no chest pain, no chills, no diarrhea, no dysuria, no sore throat and no vomiting     Past Medical History  Diagnosis Date  . Prostate cancer 2010  . PAD (peripheral artery disease)   . Gangrene of toe     dry  . Neuromuscular disorder     diabetic neuropathy  . Hypertension     takes Amlodipine daily and Metoprolol bid  . Hyperlipidemia     takes Pravastatin daily  . Myocardial infarction 2005  . Peripheral neuropathy   . Constipation     takes Miralax daily  . Hemorrhoids   . Hx of colonic polyps   . History of kidney stones   . Diabetes mellitus     Novolog and Levemir daily  . Coronary artery disease   . BPH (benign prostatic hypertrophy)   . Abnormality of gait   . Coronary atherosclerosis of native coronary artery   . Phlebitis and thrombophlebitis of other deep vessels of lower extremities   . Chronic kidney disease   . Hypertrophy of prostate  without urinary obstruction and other lower urinary tract symptoms (LUTS)   . Disorder of bone and cartilage, unspecified   . Lower limb amputation, below knee   . Long term (current) use of anticoagulants     Past Surgical History  Procedure Laterality Date  . Prostate surgery  2009    prostatectomy  . Knee cartilage surgery  at age 58    left knee  . Cea      Right  . Coronary artery bypass graft  2005    x 4  . Carotid endarterectomy  2005    right  . Cataract surgery  2011    bilateral  . Colonoscopy    . Cardiac catheterization  05/28/11  . Amputation  06/14/2011    Procedure: AMPUTATION DIGIT;  Surgeon: Sherren Kerns, MD;  Location: [facility] OR;  Service: Vascular;  Laterality: Left;  Amputation Left fifth toe  . Amputation  06/16/2011    Procedure: AMPUTATION BELOW KNEE;  Surgeon: Sherren Kerns, MD;  Location: [facility] OR;  Service: Vascular;  Laterality: Left;  . I&d extremity  01/31/2012    Procedure: IRRIGATION AND DEBRIDEMENT EXTREMITY;  Surgeon: Sherren Kerns, MD;  Location: [facility] OR;  Service: Vascular;  Laterality: Left;  I & D Left BKA   . Amputation Right 06/12/2012    Procedure: AMPUTATION DIGIT;  Surgeon: Sherren Kerns,  MD;  Location: [facility] OR;  Service: Vascular;  Laterality: Right;  GREAT TOE    Family History  Problem Relation Age of Onset  . Coronary artery disease Neg Hx   . Anesthesia problems Neg Hx   . Hypotension Neg Hx   . Malignant hyperthermia Neg Hx   . Pseudochol deficiency Neg Hx   . Hyperlipidemia Mother   . Hypertension Mother   . Cancer Mother   . Hyperlipidemia Father   . Hypertension Father   . Kidney disease Father   . Heart disease Sister   . Alzheimer's disease Sister   . Cancer Brother   . Other Sister     History  Substance Use Topics  . Smoking status: Former Smoker -- 1.00 packs/day for 40 years    Types: Cigarettes    Quit date: 04/28/1991  . Smokeless tobacco: Never Used     Comment: occ alcohol  . Alcohol Use: Yes      Comment: occasional      Review of Systems  Constitutional: Positive for fever. Negative for chills.  HENT: Negative for sore throat.   Cardiovascular: Negative for chest pain.  Gastrointestinal: Negative for vomiting and diarrhea.  Genitourinary: Negative for dysuria.  All other systems reviewed and are negative.    Allergies  Review of patient's allergies indicates no known allergies.  Home Medications   Current Outpatient Rx  Name  Route  Sig  Dispense  Refill  . amLODipine (NORVASC) 10 MG tablet   Oral   Take 5 mg by mouth daily.         [name] aspirin EC 81 MG tablet   Oral   Take 81 mg by mouth every morning.          . cephALEXin (KEFLEX) 500 MG capsule   Oral   Take 1,000 mg by mouth 2 (two) times daily. Duration unknown to pt and family         . clopidogrel (PLAVIX) 75 MG tablet   Oral   Take 1 tablet (75 mg total) by mouth daily.   30 tablet   11   . docusate sodium (COLACE) 100 MG capsule   Oral   Take 100 mg by mouth 2 (two) times daily.          [name] doxazosin (CARDURA) 1 MG tablet   Oral   Take 0.5 mg by mouth at bedtime.          . insulin aspart (NOVOLOG FLEXPEN) 100 UNIT/ML injection   Subcutaneous   Inject 0-5 Units into the skin 3 (three) times daily before meals. Sliding scale 0 units=below 150 anything greater 5 units         . insulin detemir (LEVEMIR FLEXPEN) 100 UNIT/ML injection   Subcutaneous   Inject 18 Units into the skin at bedtime.         [name] oxyCODONE (OXY IR/ROXICODONE) 5 MG immediate release tablet   Oral   Take 5-10 mg by mouth every 4 (four) hours as needed for pain.         . polyethylene glycol (MIRALAX / GLYCOLAX) packet   Oral   Take 17 g by mouth daily.         . pravastatin (PRAVACHOL) 80 MG tablet   Oral   Take 80 mg by mouth every morning.          . pregabalin (LYRICA) 200 MG capsule   Oral   Take 200 mg by mouth 2 (two) times  daily.           BP 142/72  Pulse 88  Temp(Src) 98.2 F  (36.8 C) (Oral)  Resp 16  SpO2 94%  Physical Exam  Nursing note and vitals reviewed. Constitutional: He is oriented to person, place, and time. He appears well-developed and well-nourished. No distress.  HENT:  Head: Normocephalic and atraumatic.  Mouth/Throat: Oropharynx is clear and moist.  Neck: Normal range of motion. Neck supple.  Cardiovascular: Normal rate and regular rhythm.   No murmur heard. Pulmonary/Chest: Effort normal and breath sounds normal. No respiratory distress. He has no rales.  Abdominal: Soft. Bowel sounds are normal. He exhibits no distension. There is no tenderness.  Musculoskeletal: Normal range of motion.  The right great toe amputation site appear clean and there is no purulent drainage.  There is some erythema to the dorsum of the foot but there is no warmth associated with it.    Lymphadenopathy:    He has no cervical adenopathy.  Neurological: He is alert and oriented to person, place, and time.  Skin: Skin is warm and dry. He is not diaphoretic.    ED Course  Procedures (including critical care time)  Labs Reviewed - No data to display No results found.   No diagnosis found.    MDM  The patient was brought by family for evaluation of fevers at home three days post amputation of the right great toe.  He is afebrile here and has no specific complaints and I have been able to find a source for the fever.  Cultures of the blood and urine were obtained as was a chest xray and cbc, both of which were unremarkable.  The only finding on the workup is the suggestion of an irregularity of the first metatarsal, possibly osteomyelitis.  The wound appears to be healing well and there is slight redness to the dorsum of the foot but no warmth.  He does not appear toxic and I feel as though discharge with follow up with Dr. Darrick Penna is appropriate.  The remainder of his labs are at their baseline.  The family inquired about the potassium which is 5.6 and not in need  of any treatment.          Geoffery Lyons, MD 06/18/12 0230

## 2012-06-17 NOTE — ED Notes (Signed)
PER [facility]: Pt from home, family called EMS for fever after having R Great Toe amputation earlier this week. Pt currently ax4, NAD. Pt is a L BKA. Vitals Stable in route.

## 2012-06-18 ENCOUNTER — Emergency Department (HOSPITAL_COMMUNITY): Payer: Medicare Other

## 2012-06-18 LAB — URINALYSIS, ROUTINE W REFLEX MICROSCOPIC
Glucose, UA: 250 mg/dL — AB
Leukocytes, UA: NEGATIVE
Specific Gravity, Urine: 1.02 (ref 1.005–1.030)
Urobilinogen, UA: 1 mg/dL (ref 0.0–1.0)

## 2012-06-18 LAB — CBC WITH DIFFERENTIAL/PLATELET
Basophils Absolute: 0.1 10*3/uL (ref 0.0–0.1)
Basophils Relative: 1 % (ref 0–1)
Eosinophils Absolute: 0.2 10*3/uL (ref 0.0–0.7)
Eosinophils Relative: 2 % (ref 0–5)
HCT: 27 % — ABNORMAL LOW (ref 39.0–52.0)
MCH: 28.9 pg (ref 26.0–34.0)
MCHC: 32.2 g/dL (ref 30.0–36.0)
MCV: 89.7 fL (ref 78.0–100.0)
Monocytes Absolute: 0.7 10*3/uL (ref 0.1–1.0)
RDW: 14.1 % (ref 11.5–15.5)

## 2012-06-18 LAB — COMPREHENSIVE METABOLIC PANEL
AST: 20 U/L (ref 0–37)
Albumin: 2.7 g/dL — ABNORMAL LOW (ref 3.5–5.2)
Calcium: 9.2 mg/dL (ref 8.4–10.5)
Creatinine, Ser: 2.99 mg/dL — ABNORMAL HIGH (ref 0.50–1.35)
GFR calc non Af Amer: 19 mL/min — ABNORMAL LOW (ref >90)

## 2012-06-18 LAB — URINE MICROSCOPIC-ADD ON

## 2012-06-19 ENCOUNTER — Encounter (HOSPITAL_COMMUNITY): Payer: Self-pay | Admitting: *Deleted

## 2012-06-19 ENCOUNTER — Encounter: Payer: Self-pay | Admitting: *Deleted

## 2012-06-19 ENCOUNTER — Inpatient Hospital Stay (HOSPITAL_COMMUNITY)
Admission: EM | Admit: 2012-06-19 | Discharge: 2012-06-26 | DRG: 603 | Disposition: A | Discharge status: Skilled Nursing Facility | Payer: Medicare Other | Attending: Internal Medicine | Admitting: Internal Medicine

## 2012-06-19 ENCOUNTER — Telehealth: Payer: Self-pay | Admitting: *Deleted

## 2012-06-19 DIAGNOSIS — I739 Peripheral vascular disease, unspecified: Secondary | ICD-10-CM | POA: Diagnosis present

## 2012-06-19 DIAGNOSIS — Z01810 Encounter for preprocedural cardiovascular examination: Secondary | ICD-10-CM

## 2012-06-19 DIAGNOSIS — L0390 Cellulitis, unspecified: Secondary | ICD-10-CM

## 2012-06-19 DIAGNOSIS — E11621 Type 2 diabetes mellitus with foot ulcer: Secondary | ICD-10-CM

## 2012-06-19 DIAGNOSIS — I10 Essential (primary) hypertension: Secondary | ICD-10-CM

## 2012-06-19 DIAGNOSIS — E875 Hyperkalemia: Secondary | ICD-10-CM

## 2012-06-19 DIAGNOSIS — R509 Fever, unspecified: Secondary | ICD-10-CM | POA: Diagnosis present

## 2012-06-19 DIAGNOSIS — C61 Malignant neoplasm of prostate: Secondary | ICD-10-CM

## 2012-06-19 DIAGNOSIS — N179 Acute kidney failure, unspecified: Secondary | ICD-10-CM

## 2012-06-19 DIAGNOSIS — I70219 Atherosclerosis of native arteries of extremities with intermittent claudication, unspecified extremity: Secondary | ICD-10-CM

## 2012-06-19 DIAGNOSIS — L98499 Non-pressure chronic ulcer of skin of other sites with unspecified severity: Secondary | ICD-10-CM

## 2012-06-19 DIAGNOSIS — E1140 Type 2 diabetes mellitus with diabetic neuropathy, unspecified: Secondary | ICD-10-CM

## 2012-06-19 DIAGNOSIS — E1352 Other specified diabetes mellitus with diabetic peripheral angiopathy with gangrene: Secondary | ICD-10-CM

## 2012-06-19 DIAGNOSIS — D649 Anemia, unspecified: Secondary | ICD-10-CM | POA: Diagnosis present

## 2012-06-19 DIAGNOSIS — L02619 Cutaneous abscess of unspecified foot: Principal | ICD-10-CM

## 2012-06-19 DIAGNOSIS — I70269 Atherosclerosis of native arteries of extremities with gangrene, unspecified extremity: Secondary | ICD-10-CM

## 2012-06-19 DIAGNOSIS — L03115 Cellulitis of right lower limb: Secondary | ICD-10-CM | POA: Diagnosis present

## 2012-06-19 DIAGNOSIS — I2510 Atherosclerotic heart disease of native coronary artery without angina pectoris: Secondary | ICD-10-CM | POA: Diagnosis present

## 2012-06-19 DIAGNOSIS — M869 Osteomyelitis, unspecified: Secondary | ICD-10-CM

## 2012-06-19 DIAGNOSIS — N184 Chronic kidney disease, stage 4 (severe): Secondary | ICD-10-CM

## 2012-06-19 HISTORY — DX: Anemia, unspecified: D64.9

## 2012-06-19 LAB — BASIC METABOLIC PANEL
Calcium: 8.8 mg/dL (ref 8.4–10.5)
GFR calc Af Amer: 27 mL/min — ABNORMAL LOW (ref >90)
GFR calc non Af Amer: 23 mL/min — ABNORMAL LOW (ref >90)
Potassium: 4.9 mEq/L (ref 3.5–5.1)
Sodium: 137 mEq/L (ref 135–145)

## 2012-06-19 LAB — URINALYSIS, ROUTINE W REFLEX MICROSCOPIC
Bilirubin Urine: NEGATIVE
Glucose, UA: 100 mg/dL — AB
Ketones, ur: NEGATIVE mg/dL
Leukocytes, UA: NEGATIVE
Nitrite: NEGATIVE
Protein, ur: >300 mg/dL — AB
pH: 6 (ref 5.0–8.0)

## 2012-06-19 LAB — URINE MICROSCOPIC-ADD ON

## 2012-06-19 LAB — PROTIME-INR: INR: 1.08 (ref 0.00–1.49)

## 2012-06-19 LAB — GLUCOSE, CAPILLARY
Glucose-Capillary: 264 mg/dL — ABNORMAL HIGH (ref 70–99)
Glucose-Capillary: 229 mg/dL — ABNORMAL HIGH (ref 70–99)

## 2012-06-19 LAB — CBC
MCH: 29 pg (ref 26.0–34.0)
MCHC: 32.5 g/dL (ref 30.0–36.0)
RDW: 13.9 % (ref 11.5–15.5)

## 2012-06-19 LAB — URINE CULTURE
Culture: NO GROWTH
Special Requests: NORMAL

## 2012-06-19 LAB — VITAMIN B12: Vitamin B-12: 311 pg/mL (ref 211–911)

## 2012-06-19 LAB — FOLATE: Folate: 14.4 ng/mL

## 2012-06-19 LAB — IRON AND TIBC: Iron: <10 ug/dL — ABNORMAL LOW (ref 42–135)

## 2012-06-19 LAB — HEMOGLOBIN A1C
Hgb A1c MFr Bld: 7.8 % — ABNORMAL HIGH (ref <5.7)
Mean Plasma Glucose: 177 mg/dL — ABNORMAL HIGH (ref <117)

## 2012-06-19 LAB — C-REACTIVE PROTEIN: CRP: 21.6 mg/dL — ABNORMAL HIGH (ref <0.60)

## 2012-06-19 MED ORDER — PIPERACILLIN-TAZOBACTAM 3.375 G IVPB
3.3750 g | Freq: Three times a day (TID) | INTRAVENOUS | Status: DC
  Administered 2012-06-19 – 2012-06-26 (×21): 3.375 g via INTRAVENOUS
  Filled 2012-06-19 (×25): qty 50

## 2012-06-19 MED ORDER — CLOPIDOGREL BISULFATE 75 MG PO TABS
75.0000 mg | ORAL_TABLET | Freq: Every day | ORAL | Status: DC
  Administered 2012-06-19 – 2012-06-22 (×4): 75 mg via ORAL
  Filled 2012-06-19 (×4): qty 1

## 2012-06-19 MED ORDER — POLYETHYLENE GLYCOL 3350 17 G PO PACK
17.0000 g | PACK | Freq: Two times a day (BID) | ORAL | Status: DC
  Administered 2012-06-19 – 2012-06-25 (×12): 17 g via ORAL
  Filled 2012-06-19 (×16): qty 1

## 2012-06-19 MED ORDER — HEPARIN SODIUM (PORCINE) 5000 UNIT/ML IJ SOLN
5000.0000 [IU] | Freq: Three times a day (TID) | INTRAMUSCULAR | Status: DC
  Administered 2012-06-19 – 2012-06-22 (×9): 5000 [IU] via SUBCUTANEOUS
  Filled 2012-06-19 (×12): qty 1

## 2012-06-19 MED ORDER — ATORVASTATIN CALCIUM 20 MG PO TABS
20.0000 mg | ORAL_TABLET | Freq: Every day | ORAL | Status: DC
  Administered 2012-06-19 – 2012-06-26 (×8): 20 mg via ORAL
  Filled 2012-06-19 (×8): qty 1

## 2012-06-19 MED ORDER — DOCUSATE SODIUM 100 MG PO CAPS
100.0000 mg | ORAL_CAPSULE | Freq: Two times a day (BID) | ORAL | Status: DC
  Administered 2012-06-19 – 2012-06-25 (×14): 100 mg via ORAL
  Filled 2012-06-19 (×17): qty 1

## 2012-06-19 MED ORDER — VANCOMYCIN HCL 10 G IV SOLR
2000.0000 mg | Freq: Once | INTRAVENOUS | Status: AC
  Administered 2012-06-19: 2000 mg via INTRAVENOUS
  Filled 2012-06-19: qty 2000

## 2012-06-19 MED ORDER — AMLODIPINE BESYLATE 5 MG PO TABS
5.0000 mg | ORAL_TABLET | Freq: Every day | ORAL | Status: DC
  Administered 2012-06-19 – 2012-06-26 (×8): 5 mg via ORAL
  Filled 2012-06-19 (×8): qty 1

## 2012-06-19 MED ORDER — PREGABALIN 50 MG PO CAPS
200.0000 mg | ORAL_CAPSULE | Freq: Two times a day (BID) | ORAL | Status: DC
  Administered 2012-06-19 – 2012-06-26 (×15): 200 mg via ORAL
  Filled 2012-06-19: qty 4
  Filled 2012-06-19: qty 3
  Filled 2012-06-19: qty 4
  Filled 2012-06-19: qty 1
  Filled 2012-06-19 (×12): qty 4

## 2012-06-19 MED ORDER — SODIUM CHLORIDE 0.9 % IJ SOLN: 3.0000 mL | INTRAMUSCULAR | Status: DC | PRN

## 2012-06-19 MED ORDER — TRAMADOL HCL 50 MG PO TABS
50.0000 mg | ORAL_TABLET | Freq: Four times a day (QID) | ORAL | Status: DC | PRN
  Filled 2012-06-19: qty 1

## 2012-06-19 MED ORDER — SODIUM CHLORIDE 0.9 % IJ SOLN
3.0000 mL | Freq: Two times a day (BID) | INTRAMUSCULAR | Status: DC
  Administered 2012-06-19 – 2012-06-26 (×7): 3 mL via INTRAVENOUS

## 2012-06-19 MED ORDER — INSULIN DETEMIR 100 UNIT/ML ~~LOC~~ SOLN
18.0000 [IU] | Freq: Every day | SUBCUTANEOUS | Status: DC
  Administered 2012-06-19: 18 [IU] via SUBCUTANEOUS
  Filled 2012-06-19 (×2): qty 0.18

## 2012-06-19 MED ORDER — VANCOMYCIN HCL IN DEXTROSE 1-5 GM/200ML-% IV SOLN
1000.0000 mg | INTRAVENOUS | Status: DC
  Administered 2012-06-20 – 2012-06-23 (×4): 1000 mg via INTRAVENOUS
  Filled 2012-06-19 (×5): qty 200

## 2012-06-19 MED ORDER — SORBITOL 70 % SOLN: 30.0000 mL | Freq: Every day | Status: DC | PRN

## 2012-06-19 MED ORDER — INSULIN ASPART 100 UNIT/ML ~~LOC~~ SOLN
0.0000 [IU] | Freq: Three times a day (TID) | SUBCUTANEOUS | Status: DC
  Administered 2012-06-19: 1 [IU] via SUBCUTANEOUS
  Administered 2012-06-19: 5 [IU] via SUBCUTANEOUS
  Administered 2012-06-20 – 2012-06-21 (×4): 2 [IU] via SUBCUTANEOUS
  Administered 2012-06-21 (×2): 3 [IU] via SUBCUTANEOUS
  Administered 2012-06-22: 2 [IU] via SUBCUTANEOUS
  Administered 2012-06-22: 1 [IU] via SUBCUTANEOUS
  Administered 2012-06-22: 2 [IU] via SUBCUTANEOUS
  Administered 2012-06-23: 5 [IU] via SUBCUTANEOUS
  Administered 2012-06-23: 2 [IU] via SUBCUTANEOUS
  Administered 2012-06-23 – 2012-06-24 (×2): 3 [IU] via SUBCUTANEOUS
  Administered 2012-06-24: 1 [IU] via SUBCUTANEOUS
  Administered 2012-06-24 – 2012-06-26 (×5): 3 [IU] via SUBCUTANEOUS
  Administered 2012-06-26 (×2): 5 [IU] via SUBCUTANEOUS

## 2012-06-19 MED ORDER — ONDANSETRON HCL 4 MG PO TABS: 4.0000 mg | ORAL_TABLET | Freq: Four times a day (QID) | ORAL | Status: DC | PRN

## 2012-06-19 MED ORDER — ACETAMINOPHEN 325 MG PO TABS
650.0000 mg | ORAL_TABLET | Freq: Four times a day (QID) | ORAL | Status: DC | PRN
  Administered 2012-06-19 – 2012-06-22 (×2): 650 mg via ORAL
  Filled 2012-06-19 (×2): qty 2

## 2012-06-19 MED ORDER — OXYCODONE HCL 5 MG PO TABS
2.5000 mg | ORAL_TABLET | Freq: Four times a day (QID) | ORAL | Status: DC | PRN
  Administered 2012-06-20 – 2012-06-21 (×3): 2.5 mg via ORAL
  Filled 2012-06-19: qty 1
  Filled 2012-06-19: qty 2
  Filled 2012-06-19: qty 1

## 2012-06-19 MED ORDER — MAGNESIUM CITRATE PO SOLN
1.0000 | Freq: Once | ORAL | Status: AC | PRN
  Filled 2012-06-19: qty 296

## 2012-06-19 MED ORDER — ACETAMINOPHEN 325 MG PO TABS
650.0000 mg | ORAL_TABLET | Freq: Once | ORAL | Status: AC
  Administered 2012-06-19: 650 mg via ORAL
  Filled 2012-06-19: qty 2

## 2012-06-19 MED ORDER — ASPIRIN EC 81 MG PO TBEC
81.0000 mg | DELAYED_RELEASE_TABLET | Freq: Every morning | ORAL | Status: DC
  Administered 2012-06-19 – 2012-06-22 (×4): 81 mg via ORAL
  Filled 2012-06-19 (×4): qty 1

## 2012-06-19 MED ORDER — SIMVASTATIN 40 MG PO TABS: 40.0000 mg | ORAL_TABLET | Freq: Every day | ORAL | Status: DC

## 2012-06-19 MED ORDER — DOXAZOSIN MESYLATE 1 MG PO TABS
0.5000 mg | ORAL_TABLET | Freq: Every day | ORAL | Status: DC
  Administered 2012-06-19 – 2012-06-25 (×7): 0.5 mg via ORAL
  Filled 2012-06-19 (×8): qty 0.5

## 2012-06-19 MED ORDER — SODIUM CHLORIDE 0.9 % IV SOLN: 250.0000 mL | INTRAVENOUS | Status: DC | PRN

## 2012-06-19 MED ORDER — ALUM & MAG HYDROXIDE-SIMETH 200-200-20 MG/5ML PO SUSP
30.0000 mL | Freq: Four times a day (QID) | ORAL | Status: DC | PRN
  Filled 2012-06-19: qty 30

## 2012-06-19 MED ORDER — ACETAMINOPHEN 650 MG RE SUPP: 650.0000 mg | Freq: Four times a day (QID) | RECTAL | Status: DC | PRN

## 2012-06-19 MED ORDER — ONDANSETRON HCL 4 MG/2ML IJ SOLN: 4.0000 mg | Freq: Four times a day (QID) | INTRAMUSCULAR | Status: DC | PRN

## 2012-06-19 MED ORDER — SODIUM CHLORIDE 0.9 % IJ SOLN
3.0000 mL | Freq: Two times a day (BID) | INTRAMUSCULAR | Status: DC
  Administered 2012-06-22 – 2012-06-26 (×2): 3 mL via INTRAVENOUS

## 2012-06-19 NOTE — ED Notes (Signed)
Pt in from home via [facility], per EMS report pt c/o weakness since R big toe sx last Tues, onset fever x 2 days, pt c/o nausea onset yesterday, pt denies current nausea, pt c/o diarrhea onset yesterday, pt A&O x 4, follows commands, speaks in complete sentences, answers questions appropriately, pt seen in ED for similar symtoms on 06/17/2012

## 2012-06-19 NOTE — ED Notes (Signed)
Attempted to call report

## 2012-06-19 NOTE — Progress Notes (Signed)
ANTIBIOTIC CONSULT NOTE - INITIAL  Pharmacy Consult for vancomycin Indication: R foot post-op cellulitis No Known Allergies  Patient Measurements: Height: 5' 8.11" (173 cm) (as of 06/12/12) Weight: 209 lb 7 oz (95 kg) (as of 06/12/12) IBW/kg (Calculated) : 68.65    Vital Signs: Temp: 98.8 F (37.1 C) (06/09 1055) Temp src: Oral (06/09 1055) BP: 129/66 mmHg (06/09 1103) Pulse Rate: 81 (06/09 1103) Intake/Output from previous day:   Intake/Output from this shift:    Labs:  Recent Labs  06/17/12 2355 06/19/12 0835 06/19/12 0934  WBC 7.5 10.2  --   HGB 8.7* 9.4*  --   PLT 287 301  --   CREATININE 2.99*  --  2.60*   Estimated Creatinine Clearance: 28.8 ml/min (by C-G formula based on Cr of 2.6). No results found for this basename: VANCOTROUGH, Leodis Binet, VANCORANDOM, GENTTROUGH, GENTPEAK, GENTRANDOM, TOBRATROUGH, TOBRAPEAK, TOBRARND, AMIKACINPEAK, AMIKACINTROU, AMIKACIN,  in the last 72 hours   Microbiology: Recent Results (from the past 720 hour(s))  SURGICAL PCR SCREEN     Status: None   Collection Time    06/09/12  4:11 PM      Result Value Range Status   MRSA, PCR NEGATIVE  NEGATIVE Final   Staphylococcus aureus NEGATIVE  NEGATIVE Final   Comment:            The Xpert SA Assay (FDA     approved for NASAL specimens     in patients over 51 years of age),     is one component of     a comprehensive surveillance     program.  Test performance has     been validated by [employer] for patients greater     than or equal to 51 year old.     It is not intended     to diagnose infection nor to     guide or monitor treatment.  CULTURE, BLOOD (ROUTINE X 2)     Status: None   Collection Time    06/18/12 12:11 AM      Result Value Range Status   Specimen Description BLOOD RIGHT ARM   Final   Special Requests BOTTLES DRAWN AEROBIC AND ANAEROBIC 10CC EACH   Final   Culture  Setup Time 06/18/2012 16:24   Final   Culture     Final   Value:        BLOOD CULTURE  RECEIVED NO GROWTH TO DATE CULTURE WILL BE HELD FOR 5 DAYS BEFORE ISSUING A FINAL NEGATIVE REPORT   Report Status PENDING   Incomplete  CULTURE, BLOOD (ROUTINE X 2)     Status: None   Collection Time    06/18/12 12:20 AM      Result Value Range Status   Specimen Description BLOOD HAND RIGHT   Final   Special Requests BOTTLES DRAWN AEROBIC AND ANAEROBIC 10CC EACH   Final   Culture  Setup Time 06/18/2012 16:24   Final   Culture     Final   Value:        BLOOD CULTURE RECEIVED NO GROWTH TO DATE CULTURE WILL BE HELD FOR 5 DAYS BEFORE ISSUING A FINAL NEGATIVE REPORT   Report Status PENDING   Incomplete    Medical History: Past Medical History  Diagnosis Date  . Prostate cancer 2010  . PAD (peripheral artery disease)   . Gangrene of toe     dry  . Neuromuscular disorder     diabetic neuropathy  .  Hypertension     takes Amlodipine daily and Metoprolol bid  . Hyperlipidemia     takes Pravastatin daily  . Myocardial infarction 2005  . Peripheral neuropathy   . Constipation     takes Miralax daily  . Hemorrhoids   . Hx of colonic polyps   . History of kidney stones   . Diabetes mellitus     Novolog and Levemir daily  . Coronary artery disease   . BPH (benign prostatic hypertrophy)   . Abnormality of gait   . Coronary atherosclerosis of native coronary artery   . Phlebitis and thrombophlebitis of other deep vessels of lower extremities   . Chronic kidney disease   . Hypertrophy of prostate without urinary obstruction and other lower urinary tract symptoms (LUTS)   . Disorder of bone and cartilage, unspecified   . Lower limb amputation, below knee   . Long term (current) use of anticoagulants   . Anemia 06/19/2012    Medications:  Prescriptions prior to admission  Medication Sig Dispense Refill  . amLODipine (NORVASC) 10 MG tablet Take 5 mg by mouth daily.      [name] aspirin EC 81 MG tablet Take 81 mg by mouth every morning.       . clopidogrel (PLAVIX) 75 MG tablet Take 1 tablet  (75 mg total) by mouth daily.  30 tablet  11  . docusate sodium (COLACE) 100 MG capsule Take 100 mg by mouth 2 (two) times daily.       [name] doxazosin (CARDURA) 1 MG tablet Take 0.5 mg by mouth at bedtime.       . insulin aspart (NOVOLOG FLEXPEN) 100 UNIT/ML injection Inject 0-5 Units into the skin 3 (three) times daily before meals. Sliding scale 0 units=below 150 anything greater 5 units      . insulin detemir (LEVEMIR FLEXPEN) 100 UNIT/ML injection Inject 18 Units into the skin at bedtime.      [name] oxyCODONE (OXY IR/ROXICODONE) 5 MG immediate release tablet Take 5-10 mg by mouth every 4 (four) hours as needed for pain.      . polyethylene glycol (MIRALAX / GLYCOLAX) packet Take 17 g by mouth daily.      . pravastatin (PRAVACHOL) 80 MG tablet Take 80 mg by mouth every morning.       . pregabalin (LYRICA) 200 MG capsule Take 200 mg by mouth 2 (two) times daily.      . cephALEXin (KEFLEX) 500 MG capsule Take 1,000 mg by mouth 2 (two) times daily. Duration unknown to pt and family       Assessment:  72 Yo M w/ R toe amputation 06/12/12, admitted with temp 100.7 and WBC 10.2 to start vancomycin per pharmacy for R foot post-op cellulitis.  MRI not ordered because it would be distorted so soon after surgery per [facility].   Wt 95 kg.  Creat 2.6 with creat cl < 30 ml/min.  6/9 BC x 2 6/8 BC x 2 6/8 Urine IP  Goal of Therapy:  Vancomycin trough level 10-15 mcg/ml  Plan:  1. Vancomycin 2000 mg IV loading dose, then vancomycin 1000 mg IV q24 2. F/u renal function, WBC, temp, culture data 3. ? Need addition of broad coverage agent such as Zosyn? Herby Abraham, Pharm.D. [phone] 06/19/2012 11:32 AM

## 2012-06-19 NOTE — Telephone Encounter (Signed)
[phone]   Bed side commode requested.I told her that was fine

## 2012-06-19 NOTE — ED Provider Notes (Signed)
History     CSN: [id number]  Arrival date & time 06/19/12  0807   First MD Initiated Contact with Patient 06/19/12 [phone]      Chief Complaint  Patient presents with  . Fever    (Consider location/radiation/quality/duration/timing/severity/associated sxs/prior treatment) Patient is a 72 y.o. male presenting with fever. The history is provided by the patient and the EMS personnel.  Fever Max temp prior to arrival:  100.7 Temp source:  Oral Severity:  Mild Onset quality:  Sudden Duration:  2 days Timing:  Constant Progression:  Worsening Chronicity:  New Relieved by:  Nothing Worsened by:  Nothing tried Ineffective treatments:  None tried Associated symptoms: nausea and vomiting (once yesterday)   Associated symptoms: no chest pain, no chills, no confusion, no congestion, no cough, no diarrhea, no rash and no rhinorrhea     Past Medical History  Diagnosis Date  . Prostate cancer 2010  . PAD (peripheral artery disease)   . Gangrene of toe     dry  . Neuromuscular disorder     diabetic neuropathy  . Hypertension     takes Amlodipine daily and Metoprolol bid  . Hyperlipidemia     takes Pravastatin daily  . Myocardial infarction 2005  . Peripheral neuropathy   . Constipation     takes Miralax daily  . Hemorrhoids   . Hx of colonic polyps   . History of kidney stones   . Diabetes mellitus     Novolog and Levemir daily  . Coronary artery disease   . BPH (benign prostatic hypertrophy)   . Abnormality of gait   . Coronary atherosclerosis of native coronary artery   . Phlebitis and thrombophlebitis of other deep vessels of lower extremities   . Chronic kidney disease   . Hypertrophy of prostate without urinary obstruction and other lower urinary tract symptoms (LUTS)   . Disorder of bone and cartilage, unspecified   . Lower limb amputation, below knee   . Long term (current) use of anticoagulants     Past Surgical History  Procedure Laterality Date  . Prostate  surgery  2009    prostatectomy  . Knee cartilage surgery  at age 53    left knee  . Cea      Right  . Coronary artery bypass graft  2005    x 4  . Carotid endarterectomy  2005    right  . Cataract surgery  2011    bilateral  . Colonoscopy    . Cardiac catheterization  05/28/11  . Amputation  06/14/2011    Procedure: AMPUTATION DIGIT;  Surgeon: Sherren Kerns, MD;  Location: [facility] OR;  Service: Vascular;  Laterality: Left;  Amputation Left fifth toe  . Amputation  06/16/2011    Procedure: AMPUTATION BELOW KNEE;  Surgeon: Sherren Kerns, MD;  Location: [facility] OR;  Service: Vascular;  Laterality: Left;  . I&d extremity  01/31/2012    Procedure: IRRIGATION AND DEBRIDEMENT EXTREMITY;  Surgeon: Sherren Kerns, MD;  Location: [facility] OR;  Service: Vascular;  Laterality: Left;  I & D Left BKA   . Amputation Right 06/12/2012    Procedure: AMPUTATION DIGIT;  Surgeon: Sherren Kerns, MD;  Location: [facility] OR;  Service: Vascular;  Laterality: Right;  GREAT TOE    Family History  Problem Relation Age of Onset  . Coronary artery disease Neg Hx   . Anesthesia problems Neg Hx   . Hypotension Neg Hx   . Malignant hyperthermia Neg Hx   .  Pseudochol deficiency Neg Hx   . Hyperlipidemia Mother   . Hypertension Mother   . Cancer Mother   . Hyperlipidemia Father   . Hypertension Father   . Kidney disease Father   . Heart disease Sister   . Alzheimer's disease Sister   . Cancer Brother   . Other Sister     History  Substance Use Topics  . Smoking status: Former Smoker -- 1.00 packs/day for 40 years    Types: Cigarettes    Quit date: 04/28/1991  . Smokeless tobacco: Never Used     Comment: occ alcohol  . Alcohol Use: Yes     Comment: occasional      Review of Systems  Constitutional: Positive for fever. Negative for chills.  HENT: Negative for congestion and rhinorrhea.   Respiratory: Negative for cough.   Cardiovascular: Negative for chest pain.  Gastrointestinal: Positive for nausea and  vomiting (once yesterday). Negative for diarrhea.  Skin: Negative for rash.  Psychiatric/Behavioral: Negative for confusion.  All other systems reviewed and are negative.    Allergies  Review of patient's allergies indicates no known allergies.  Home Medications   Current Outpatient Rx  Name  Route  Sig  Dispense  Refill  . amLODipine (NORVASC) 10 MG tablet   Oral   Take 5 mg by mouth daily.         [name] aspirin EC 81 MG tablet   Oral   Take 81 mg by mouth every morning.          . cephALEXin (KEFLEX) 500 MG capsule   Oral   Take 1,000 mg by mouth 2 (two) times daily. Duration unknown to pt and family         . clopidogrel (PLAVIX) 75 MG tablet   Oral   Take 1 tablet (75 mg total) by mouth daily.   30 tablet   11   . docusate sodium (COLACE) 100 MG capsule   Oral   Take 100 mg by mouth 2 (two) times daily.          [name] doxazosin (CARDURA) 1 MG tablet   Oral   Take 0.5 mg by mouth at bedtime.          . insulin aspart (NOVOLOG FLEXPEN) 100 UNIT/ML injection   Subcutaneous   Inject 0-5 Units into the skin 3 (three) times daily before meals. Sliding scale 0 units=below 150 anything greater 5 units         . insulin detemir (LEVEMIR FLEXPEN) 100 UNIT/ML injection   Subcutaneous   Inject 18 Units into the skin at bedtime.         [name] oxyCODONE (OXY IR/ROXICODONE) 5 MG immediate release tablet   Oral   Take 5-10 mg by mouth every 4 (four) hours as needed for pain.         . polyethylene glycol (MIRALAX / GLYCOLAX) packet   Oral   Take 17 g by mouth daily.         . pravastatin (PRAVACHOL) 80 MG tablet   Oral   Take 80 mg by mouth every morning.          . pregabalin (LYRICA) 200 MG capsule   Oral   Take 200 mg by mouth 2 (two) times daily.           BP 173/140  Pulse 102  Temp(Src) 100.7 F (38.2 C) (Oral)  Resp 20  SpO2 97%  Physical Exam  Nursing note and vitals reviewed.  Constitutional: He is oriented to person, place, and time.  He appears well-developed and well-nourished. No distress.  HENT:  Head: Normocephalic and atraumatic.  Mouth/Throat: No oropharyngeal exudate.  Eyes: EOM are normal. Pupils are equal, round, and reactive to light.  Neck: Normal range of motion. Neck supple.  Cardiovascular: Normal rate and regular rhythm.  Exam reveals no friction rub.   No murmur heard. Pulmonary/Chest: Effort normal and breath sounds normal. No respiratory distress. He has no wheezes. He has no rales.  Abdominal: He exhibits no distension. There is no tenderness. There is no rebound.  Musculoskeletal: Normal range of motion. He exhibits no edema.  L BKA, stump c/d/i R great toe amputation, surgical site with surrounding erythema, no drainage. Redness on dorsum of R foot, around surgical site. Painful on palpation.  Neurological: He is alert and oriented to person, place, and time.  Skin: He is not diaphoretic.    ED Course  Procedures (including critical care time)  Labs Reviewed  CBC   Dg Chest 2 View  06/18/2012   *RADIOLOGY REPORT*  Clinical Data: 73 year old male with fever  CHEST - 2 VIEW  Comparison: 01/28/2012 and prior chest radiographs  Findings: Cardiomegaly and CABG changes again noted. Mild basilar scarring noted. There is no evidence of focal airspace disease, pulmonary edema, suspicious pulmonary nodule/mass, pleural effusion, or pneumothorax. No acute bony abnormalities are identified.  IMPRESSION: Cardiomegaly without  evidence of acute cardiopulmonary disease.   Original Report Authenticated By: [name] Pier, M.D.   Dg Abd 2 Views  06/18/2012   *RADIOLOGY REPORT*  Clinical Data: 72 year old male with abdominal pain.  ABDOMEN - 2 VIEW  Comparison: 05/05/2010 radiographs  Findings: A few nondistended gas-filled loops of small bowel within the abdomen are noted. Gas and stool within the colon identified. No dilated bowel loops are noted. There is no evidence of pneumoperitoneum. Heavy vascular calcifications  and clips within the pelvis noted. No acute bony abnormalities are present.  IMPRESSION: Nonspecific nonobstructive bowel gas pattern without evidence of pneumoperitoneum.   Original Report Authenticated By: [name] Pier, M.D.   Dg Foot Complete Right  06/18/2012   *RADIOLOGY REPORT*  Clinical Data: Fever; status post right toe amputation.  RIGHT FOOT COMPLETE - 3+ VIEW  Comparison: Right great toe radiographs performed 05/09/2012  Findings: There has been interval amputation of the right great toe, including the proximal and distal phalanges, with minimal residual osseous fragments from the first proximal phalanx.  Mild associated cortical irregularity is noted along the distal first metatarsal; mild changes of osteomyelitis cannot be excluded, given the patient's fever.  Visualized joint spaces are grossly preserved.  Diffuse vascular calcifications are seen.  IMPRESSION:  1.  Interval amputation of the right great toe, including the proximal and distal phalanges, with minimal residual osseous fragments noted.  Mild associated cortical irregularity along the distal first metatarsal, new from the prior study.  Mild changes of osteomyelitis cannot be excluded, given the patient's symptoms. This would be better assessed on MRI, when and as clinically appropriate. 2.  Diffuse vascular calcifications seen.   Original Report Authenticated By: Tonia Ghent, M.D.     1. Fever and chills   2. Cellulitis of foot, right   3. Anemia   4. CKD (chronic kidney disease) stage 3, GFR 30-59 ml/min       MDM   89M with hx of R toe amputation 1 week ago, here with fever. Seen yesterday, has blood cultures and urine cultures pending. No white count at  that time, was afebrile when in the ED. On Xray, could not rule out osteo. Patient back with fever, N/V yesterday, nausea today. Denies SOB, cough, abdominal pain, dysuria. R foot with redness on dorsum and around site. No purulent drainage from surgical site. Lungs clear,  abdomen soft. Febrile here. Will repeat CBC and talk with Vascular Surgery.  Vascular surgery does not feel that the patient's symptoms are secondary to his recent surgery. They are concerned for another process. He also feels that with his other medical comorbidities and intermittent hyperkalemia of late that he would be better served by medicine service. They will follow with medicine once he is admitted. Medicine consult and admitting patient.  Dagmar Hait, MD 06/19/12 [phone]

## 2012-06-19 NOTE — Progress Notes (Signed)
Pt arrived to unit via NT from ED on monitor. Pt is A&Ox4, spouse at bedside. Admission information completed. Pt placed on bed alarm, oriented to call bell. Will continue to monitor.

## 2012-06-19 NOTE — Progress Notes (Addendum)
[facility] [place]  Postoperative Visit - Amputation  Date of Surgery: Right great toe amputation due to gangrene 06/12/2012  Right Surgeon: Dr. Fabienne Bruns   Subjective Aaron Long is a 72 y.o. male who is S/P angiogram which showed severe tibial disease. He then had Right lower extremity arteriogram angioplasty of right tibio-peroneal trunk on 06/09/12 to establish better flow to foot. He is now admitted s/p right great toe amputation.  His wife brought him to the ED this am secondary to rigors and a temprature of 101.0.  She is also very concerned about his hyperkalemia, hypertension and lack of motivation in daily activities.   He has co-morbidies of DM, hypertension, PAD, CAD.      Significant Diagnostic Studies: CBC Lab Results  Component Value Date   WBC 10.2 06/19/2012   HGB 9.4* 06/19/2012   HCT 28.9* 06/19/2012   MCV 89.2 06/19/2012   PLT 301 06/19/2012    BMET    Component Value Date/Time   NA 137 06/19/2012 0934   NA 143 05/23/2012 1250   K 4.9 06/19/2012 0934   CL 104 06/19/2012 0934   CO2 23 06/19/2012 0934   GLUCOSE 172* 06/19/2012 0934   GLUCOSE 84 05/23/2012 1250   BUN 39* 06/19/2012 0934   BUN 39* 05/23/2012 1250   CREATININE 2.60* 06/19/2012 0934   CALCIUM 8.8 06/19/2012 0934   GFRNONAA 23* 06/19/2012 0934   GFRAA 27* 06/19/2012 0934    COAG Lab Results  Component Value Date   INR 1.02 01/31/2012   INR 1.98* 01/28/2012   INR 2.59* 07/01/2011   No results found for this basename: PTT    No intake or output data in the 24 hours ending 06/19/12 1027 No data found.    Physical Examination  BP Readings from Last 3 Encounters:  06/19/12 162/58  06/18/12 130/64  06/14/12 186/70   Temp Readings from Last 3 Encounters:  06/19/12 100.7 F (38.2 C) Oral  06/17/12 98.2 F (36.8 C) Oral  06/14/12 98.6 F (37 C) Oral   SpO2 Readings from Last 3 Encounters:  06/19/12 96%  06/18/12 98%  06/14/12 100%   Pulse Readings from Last 3 Encounters:   06/19/12 81  06/18/12 78  06/14/12 89    Pt is A&Ox3  WDWN male with complaints of shacking chills and pain when he stands to transfer to a chair.  Right great toe and metatarsal head amputation site is dusky at the incision and the dorsum of the foot is erythematous.   There is good bone coverage in the stump Stump is warm and well perfused, without drainage;  Doppler DP/PT left foot. Ipsilateral  BKA.   Assessment/plan:  Aaron Long is a 72 y.o. male who has recently had revascularization of his right lower extremity is now 7 days post-op right great toe/metatarsal head removal.  We will not order an MRI this soon after his surgery it will be distorted.  He has doppler DP/PT pulses.  His WBC is 10.2 and his temp is 100.7.  We want to give him every chance to heal this post-op wound and avoid further amputation.  We will follow him during his hospital stay. I will discuss further treatment plans with Dr. Darrick Penna.    Aaron Long 10:27 AM 06/19/2012    Cellulitis right foot s/p right 1st toe amputation.  No obvious abscess but certainly cellulitis worrisome for deep infection.  Imaging studies not really useful this close to recent  surgery as there will definitely be inflammation of soft tissues and bone.  Best management watchful waiting and IV antibiotics.  Discussed with patient and his wife that if infection does not respond to antibiotics over the next several days he will need a right BKA.  Will follow with you daily.  Fabienne Bruns, MD [facility] [place] Office: [phone] Pager: [phone]

## 2012-06-19 NOTE — H&P (Signed)
[facility] History and Physical  Aaron Long AVW:[id number] DOB: [birth year] DOA: 06/19/2012  Referring physician: Dr. Rubin Payor PCP: Oneal Grout, MD  Specialists: Vascular surgeon: Dr. Fields/nephrologist: Dr. Caryn Section  Chief Complaint: Fever and chills  HPI: Aaron Long is a 72 y.o. male with past medical history of severe vascular disease, hypertension, diabetes mellitus, status post left BKA, status post right great toe amputation secondary to gangrene 06/12/2012 per Dr. Darrick Penna, history of chronic kidney disease, hyperlipidemia who presents to the ED with a 2 day history of fevers as high as 101 on the morning of admission, generalized chills, generalized weakness, decreased energy, decreased strength. Patient denies any cough, no shortness of breath, no chest pain, no abdominal pain, no dysuria, no diarrhea, no melena, no hematemesis, no hematochezia, no headaches, no speech difficulties. Patient was seen in the emergency room one day prior to admission and at that time chest x-ray which was done was negative. Comprehensive metabolic profile done had a potassium of 5.6 by BUN of 49 and a creatinine of 2.99 otherwise was within normal limits. CBC done on the time of a hemoglobin of 8.7 otherwise was within normal limits. Patient was subsequently discharged home. Patient presented back to the ED on the morning of admission with fever and generalized chills with temp of 101. Will call to admit the patient for further evaluation and management. ED physician stated he spoke with vascular surgery will consult on the patient as well.  Review of Systems: The patient denies anorexia, fever, weight loss,, vision loss, decreased hearing, hoarseness, chest pain, syncope, dyspnea on exertion, peripheral edema, balance deficits, hemoptysis, abdominal pain, melena, hematochezia, severe indigestion/heartburn, hematuria, incontinence, genital sores, muscle weakness, suspicious skin lesions, transient  blindness, difficulty walking, depression, unusual weight change, abnormal bleeding, enlarged lymph nodes, angioedema, and breast masses.   Past Medical History  Diagnosis Date  . Prostate cancer 2010  . PAD (peripheral artery disease)   . Gangrene of toe     dry  . Neuromuscular disorder     diabetic neuropathy  . Hypertension     takes Amlodipine daily and Metoprolol bid  . Hyperlipidemia     takes Pravastatin daily  . Myocardial infarction 2005  . Peripheral neuropathy   . Constipation     takes Miralax daily  . Hemorrhoids   . Hx of colonic polyps   . History of kidney stones   . Diabetes mellitus     Novolog and Levemir daily  . Coronary artery disease   . BPH (benign prostatic hypertrophy)   . Abnormality of gait   . Coronary atherosclerosis of native coronary artery   . Phlebitis and thrombophlebitis of other deep vessels of lower extremities   . Chronic kidney disease   . Hypertrophy of prostate without urinary obstruction and other lower urinary tract symptoms (LUTS)   . Disorder of bone and cartilage, unspecified   . Lower limb amputation, below knee   . Long term (current) use of anticoagulants   . Anemia 06/19/2012   Past Surgical History  Procedure Laterality Date  . Prostate surgery  2009    prostatectomy  . Knee cartilage surgery  at age 63    left knee  . Cea      Right  . Coronary artery bypass graft  2005    x 4  . Carotid endarterectomy  2005    right  . Cataract surgery  2011    bilateral  . Colonoscopy    . Cardiac  catheterization  05/28/11  . Amputation  06/14/2011    Procedure: AMPUTATION DIGIT;  Surgeon: Sherren Kerns, MD;  Location: [facility] OR;  Service: Vascular;  Laterality: Left;  Amputation Left fifth toe  . Amputation  06/16/2011    Procedure: AMPUTATION BELOW KNEE;  Surgeon: Sherren Kerns, MD;  Location: [facility] OR;  Service: Vascular;  Laterality: Left;  . I&d extremity  01/31/2012    Procedure: IRRIGATION AND DEBRIDEMENT EXTREMITY;   Surgeon: Sherren Kerns, MD;  Location: [facility] OR;  Service: Vascular;  Laterality: Left;  I & D Left BKA   . Amputation Right 06/12/2012    Procedure: AMPUTATION DIGIT;  Surgeon: Sherren Kerns, MD;  Location: [facility] OR;  Service: Vascular;  Laterality: Right;  GREAT TOE   Social History:  reports that he quit smoking about 21 years ago. His smoking use included Cigarettes. He has a 40 pack-year smoking history. He has never used smokeless tobacco. He reports that  drinks alcohol. He reports that he does not use illicit drugs.  No Known Allergies  Family History  Problem Relation Age of Onset  . Coronary artery disease Neg Hx   . Anesthesia problems Neg Hx   . Hypotension Neg Hx   . Malignant hyperthermia Neg Hx   . Pseudochol deficiency Neg Hx   . Hyperlipidemia Mother   . Hypertension Mother   . Cancer Mother   . Hyperlipidemia Father   . Hypertension Father   . Kidney disease Father   . Heart disease Sister   . Alzheimer's disease Sister   . Cancer Brother   . Other Sister     Prior to Admission medications   Medication Sig Start Date End Date Taking? Authorizing Provider  amLODipine (NORVASC) 10 MG tablet Take 5 mg by mouth daily.   Yes Historical Provider, MD  aspirin EC 81 MG tablet Take 81 mg by mouth every morning.    Yes Historical Provider, MD  clopidogrel (PLAVIX) 75 MG tablet Take 1 tablet (75 mg total) by mouth daily. 06/09/12  Yes Sherren Kerns, MD  docusate sodium (COLACE) 100 MG capsule Take 100 mg by mouth 2 (two) times daily.    Yes Historical Provider, MD  doxazosin (CARDURA) 1 MG tablet Take 0.5 mg by mouth at bedtime.  03/29/12  Yes Claudie Revering, NP  insulin aspart (NOVOLOG FLEXPEN) 100 UNIT/ML injection Inject 0-5 Units into the skin 3 (three) times daily before meals. Sliding scale 0 units=below 150 anything greater 5 units   Yes Historical Provider, MD  insulin detemir (LEVEMIR FLEXPEN) 100 UNIT/ML injection Inject 18 Units into the skin at bedtime.   Yes  Historical Provider, MD  oxyCODONE (OXY IR/ROXICODONE) 5 MG immediate release tablet Take 5-10 mg by mouth every 4 (four) hours as needed for pain. 06/14/12  Yes Regina J Roczniak, PA-C  polyethylene glycol (MIRALAX / GLYCOLAX) packet Take 17 g by mouth daily.   Yes Historical Provider, MD  pravastatin (PRAVACHOL) 80 MG tablet Take 80 mg by mouth every morning.  03/29/12  Yes Claudie Revering, NP  pregabalin (LYRICA) 200 MG capsule Take 200 mg by mouth 2 (two) times daily.   Yes Historical Provider, MD  cephALEXin (KEFLEX) 500 MG capsule Take 1,000 mg by mouth 2 (two) times daily. Duration unknown to pt and family    Historical Provider, MD   Physical Exam: Filed Vitals:   06/19/12 1000 06/19/12 1040 06/19/12 1055 06/19/12 1103  BP: 162/58 160/73  129/66  Pulse:  81 83  81  Temp:   98.8 F (37.1 C)   TempSrc:   Oral   Resp:      SpO2: 96% 97%  99%     General:  Well-developed well-nourished in no acute cardiopulmonary distress.  Eyes: Pupils equal around and reactive to light and accommodation. Extraocular movements intact.  ENT: Oropharynx is clear, no lesions, no exudates. Mildly dry mucous membranes.  Neck: Supple with no lymphadenopathy.  Cardiovascular: Regular rate rhythm no murmurs rubs or gallops.  Respiratory: Clear to auscultation bilaterally. No wheezing, no crackles, no rhonchi.  Abdomen: Soft, nontender, nondistended, positive bowel sounds.  Skin: No rashes or lesions.  Extremities: Status post left BKA. Right forefoot with an erythema on the dorsum of the foot with some warmth, and a point tenderness to palpation. Status post right great toe amputation with a black eschar at incision site.  Musculoskeletal: 4/5 RLE strength. S/p LBKA  Psychiatric: Normal mood. Normal affect. Good insight. Good judgment.  Neurologic: Alert and oriented x3. Cranial nerves II through XII are grossly intact. No focal deficits.  Labs on Admission:  Basic Metabolic Panel:  Recent  Labs Lab 06/12/12 1621 06/13/12 0519 06/14/12 0508 06/17/12 2355 06/19/12 0934  NA  --  137 136 134* 137  K  --  5.4* 5.4* 5.6* 4.9  CL  --  103 106 103 104  CO2  --  25 23 24 23   GLUCOSE  --  208* 141* 290* 172*  BUN  --  39* 40* 49* 39*  CREATININE 2.73* 3.18* 3.05* 2.99* 2.60*  CALCIUM  --  8.7 9.0 9.2 8.8   Liver Function Tests:  Recent Labs Lab 06/17/12 2355  AST 20  ALT 12  ALKPHOS 129*  BILITOT 0.2*  PROT 6.9  ALBUMIN 2.7*   No results found for this basename: LIPASE, AMYLASE,  in the last 168 hours No results found for this basename: AMMONIA,  in the last 168 hours CBC:  Recent Labs Lab 06/12/12 1621 06/13/12 0519 06/14/12 0508 06/17/12 2355 06/19/12 0835  WBC 5.9 7.8 8.7 7.5 10.2  NEUTROABS  --   --   --  5.1  --   HGB 9.5* 9.6* 9.5* 8.7* 9.4*  HCT 29.6* 30.4* 29.5* 27.0* 28.9*  MCV 90.5 91.0 90.2 89.7 89.2  PLT 229 242 255 287 301   Cardiac Enzymes: No results found for this basename: CKTOTAL, CKMB, CKMBINDEX, TROPONINI,  in the last 168 hours  BNP (last 3 results) No results found for this basename: PROBNP,  in the last 8760 hours CBG:  Recent Labs Lab 06/13/12 1222 06/13/12 1716 06/13/12 2141 06/14/12 0758 06/14/12 1209  GLUCAP 103* 142* 214* 140* 82    Radiological Exams on Admission: Dg Chest 2 View  06/18/2012   *RADIOLOGY REPORT*  Clinical Data: 72 year old male with fever  CHEST - 2 VIEW  Comparison: 01/28/2012 and prior chest radiographs  Findings: Cardiomegaly and CABG changes again noted. Mild basilar scarring noted. There is no evidence of focal airspace disease, pulmonary edema, suspicious pulmonary nodule/mass, pleural effusion, or pneumothorax. No acute bony abnormalities are identified.  IMPRESSION: Cardiomegaly without  evidence of acute cardiopulmonary disease.   Original Report Authenticated By: [name] Pier, M.D.   Dg Abd 2 Views  06/18/2012   *RADIOLOGY REPORT*  Clinical Data: 72 year old male with abdominal pain.  ABDOMEN  - 2 VIEW  Comparison: 05/05/2010 radiographs  Findings: A few nondistended gas-filled loops of small bowel within the abdomen are noted. Gas and stool  within the colon identified. No dilated bowel loops are noted. There is no evidence of pneumoperitoneum. Heavy vascular calcifications and clips within the pelvis noted. No acute bony abnormalities are present.  IMPRESSION: Nonspecific nonobstructive bowel gas pattern without evidence of pneumoperitoneum.   Original Report Authenticated By: [name] Pier, M.D.   Dg Foot Complete Right  06/18/2012   *RADIOLOGY REPORT*  Clinical Data: Fever; status post right toe amputation.  RIGHT FOOT COMPLETE - 3+ VIEW  Comparison: Right great toe radiographs performed 05/09/2012  Findings: There has been interval amputation of the right great toe, including the proximal and distal phalanges, with minimal residual osseous fragments from the first proximal phalanx.  Mild associated cortical irregularity is noted along the distal first metatarsal; mild changes of osteomyelitis cannot be excluded, given the patient's fever.  Visualized joint spaces are grossly preserved.  Diffuse vascular calcifications are seen.  IMPRESSION:  1.  Interval amputation of the right great toe, including the proximal and distal phalanges, with minimal residual osseous fragments noted.  Mild associated cortical irregularity along the distal first metatarsal, new from the prior study.  Mild changes of osteomyelitis cannot be excluded, given the patient's symptoms. This would be better assessed on MRI, when and as clinically appropriate. 2.  Diffuse vascular calcifications seen.   Original Report Authenticated By: Tonia Ghent, M.D.    EKG: None  Assessment/Plan Principal Problem:   Fever and chills Active Problems:   HTN (hypertension)   CAD (coronary artery disease)   PAD (peripheral artery disease)   CKD (chronic kidney disease) stage 3, GFR 30-59 ml/min   S/P BKA (below knee amputation)    Diabetic neuropathy, type II diabetes mellitus   Cellulitis of foot, right   Anemia  #1 fever/chills Likely secondary to right forefoot cellulitis noted on dorsum of the right foot. Patient is status post recent right great to amputation secondary to gangrene on 06/13/2011 per vascular surgery. Chest x-ray which was done was negative and a such will not repeat at this time as patient does not have any respiratory symptoms. Will repeat UA with cultures and sensitivities. Repeat blood cultures x2. Will place empirically on IV vancomycin and follow.  #2 right forefoot cellulitis Patient is status post recent right great toe amputation. Patient present with fever and chills and noted to have an erythema with some warmth on the dorsum of the right forefoot. Will check blood cultures x2. Will place empirically on IV vancomycin. Follow.  #3 status post recent right great toe amputation Continue pain management. Vascular surgery is following. Per vascular surgery.  #4 hypertension Stable. Continue home regimen of Norvasc and doxazosin. and .  #5 chronic kidney disease stage III Stable. Monitor closely for volume overload. Follow. Will need to followup with his nephrologist as outpatient.  #6 type 2 diabetes Check a hemoglobin A1c. Continue home regimen of Levemir. We'll place on sliding scale insulin. Continue Lyrica.  #7 anemia Likely secondary to anemia of chronic disease secondary to chronic kidney disease. Patient with no overt GI bleed. Check an anemia panel. Follow H&H.  #8 severe peripheral vascular disease Stable. Continue aspirin and Plavix.  #9 history of coronary artery disease/hyperlipidemia Stable. Patient currently asymptomatic. Continue aspirin, Norvasc, Plavix, statin.  #10 chronic constipation Continue Colace twice daily. Change MiraLAX to twice daily. Sorbitol when necessary. Magnesium citrate when necessary.  #11 prophylaxis Heparin for DVT prophylaxis.   Code Status:  Full Family Communication: Updated patient, wife and stepdaughter at bedside. Disposition Plan: Admit to telemetry.  Time spent: 70 mins  [facility] Pager [phone]  If 7PM-7AM, please contact night-coverage [email or web address] Password [email or web address] 06/19/2012, 11:20 AM

## 2012-06-20 ENCOUNTER — Ambulatory Visit: Payer: Self-pay | Admitting: Internal Medicine

## 2012-06-20 DIAGNOSIS — E1149 Type 2 diabetes mellitus with other diabetic neurological complication: Secondary | ICD-10-CM

## 2012-06-20 DIAGNOSIS — E1142 Type 2 diabetes mellitus with diabetic polyneuropathy: Secondary | ICD-10-CM

## 2012-06-20 LAB — URINE CULTURE
Colony Count: NO GROWTH
Culture: NO GROWTH

## 2012-06-20 LAB — CBC
Hemoglobin: 8.5 g/dL — ABNORMAL LOW (ref 13.0–17.0)
MCHC: 32.3 g/dL (ref 30.0–36.0)
RBC: 2.93 MIL/uL — ABNORMAL LOW (ref 4.22–5.81)

## 2012-06-20 LAB — BASIC METABOLIC PANEL
GFR calc Af Amer: 26 mL/min — ABNORMAL LOW (ref >90)
GFR calc non Af Amer: 22 mL/min — ABNORMAL LOW (ref >90)
Potassium: 4.8 mEq/L (ref 3.5–5.1)
Sodium: 137 mEq/L (ref 135–145)

## 2012-06-20 LAB — GLUCOSE, CAPILLARY
Glucose-Capillary: 181 mg/dL — ABNORMAL HIGH (ref 70–99)
Glucose-Capillary: 214 mg/dL — ABNORMAL HIGH (ref 70–99)

## 2012-06-20 MED ORDER — SODIUM CHLORIDE 0.9 % IV SOLN: 250.0000 mg | INTRAVENOUS | Status: DC

## 2012-06-20 MED ORDER — INSULIN DETEMIR 100 UNIT/ML ~~LOC~~ SOLN
20.0000 [IU] | Freq: Every day | SUBCUTANEOUS | Status: DC
  Administered 2012-06-20 – 2012-06-22 (×3): 20 [IU] via SUBCUTANEOUS
  Filled 2012-06-20 (×4): qty 0.2

## 2012-06-20 MED ORDER — SODIUM CHLORIDE 0.9 % IV SOLN
1020.0000 mg | Freq: Once | INTRAVENOUS | Status: AC
  Administered 2012-06-20: 1020 mg via INTRAVENOUS
  Filled 2012-06-20: qty 34

## 2012-06-20 NOTE — Progress Notes (Signed)
[facility] PROGRESS NOTE  Aaron Long ZOX:[id number] DOB: 1940/07/10 DOA: 06/19/2012 PCP: Oneal Grout, MD  Assessment/Plan: #1 right forefoot cellulitis Fevers and improving. Patient states right foot pain improving. Still with some erythema however decreased tenderness to palpation. Blood cultures are pending. Continue empiric IV vancomycin and Zosyn. Vascular surgery following and if no significant improvement may require amputation.  #2 fever/chills Likely secondary to problem #1. Blood cultures are pending. Urinalysis is negative. Continue empiric IV vancomycin and Zosyn.  #3 status post recent right great toe amputation Continue pain management. Per vascular surgery.  #4 hypertension Continue current regimen of Norvasc, Cardura.  #5 chronic kidney disease stage III Stable.  #6 type 2 diabetes Hemoglobin A1c 7.8. CBGs have ranged from 172 - 241. Increase Levemir to 20 units daily. Sliding scale insulin.  #7 severe iron deficiency anemia H&H stable. Will place on IV iron.  #8 severe peripheral vascular disease Continue aspirin and Plavix.  #9 history of coronary artery disease/hyperlipidemia Stable. Continue aspirin, Norvasc, Plavix, statin.  #10 chronic constipation Continue Colace and MiraLAX.  #11 prophylaxis Heparin for DVT prophylaxis.  Code Status: Full Family Communication: Updated patient wife and daughter at bedside Disposition Plan: Home when medically stable.    Consultants:  Vascular surgery : Dr Darrick Penna 06/19/12  Procedures:  None  Antibiotics:  IV Vancomycin 06/19/12  IV Zosyn 06/19/12  HPI/Subjective: Patient states pain improved. Feeling better.  Objective: Filed Vitals:   06/19/12 1844 06/19/12 2110 06/20/12 0549 06/20/12 0911  BP:  143/60 140/62 131/54  Pulse:  78 79 78  Temp: 100.5 F (38.1 C) 99.3 F (37.4 C) 97.8 F (36.6 C) 99.9 F (37.7 C)  TempSrc: Oral Oral Oral   Resp:  18 18 18   Height:      Weight:       SpO2:  96% 98% 98%    Intake/Output Summary (Last 24 hours) at 06/20/12 1145 Last data filed at 06/20/12 0912  Gross per 24 hour  Intake    720 ml  Output    670 ml  Net     50 ml   Filed Weights   06/19/12 1103  Weight: 95 kg (209 lb 7 oz)    Exam:   General:   NAD  Cardiovascular: RRR  Respiratory: CTAB  Abdomen: Soft/NT/ND/+BS  Extremities: RLE with some erythema, less TTP. S/p LBKA  Data Reviewed: Basic Metabolic Panel:  Recent Labs Lab 06/14/12 0508 06/17/12 2355 06/19/12 0934 06/19/12 1200 06/20/12 0505  NA 136 134* 137  --  137  K 5.4* 5.6* 4.9  --  4.8  CL 106 103 104  --  105  CO2 23 24 23   --  25  GLUCOSE 141* 290* 172*  --  241*  BUN 40* 49* 39*  --  39*  CREATININE 3.05* 2.99* 2.60*  --  2.69*  CALCIUM 9.0 9.2 8.8  --  9.0  MG  --   --   --  1.9  --    Liver Function Tests:  Recent Labs Lab 06/17/12 2355  AST 20  ALT 12  ALKPHOS 129*  BILITOT 0.2*  PROT 6.9  ALBUMIN 2.7*   No results found for this basename: LIPASE, AMYLASE,  in the last 168 hours No results found for this basename: AMMONIA,  in the last 168 hours CBC:  Recent Labs Lab 06/14/12 0508 06/17/12 2355 06/19/12 0835 06/20/12 0505  WBC 8.7 7.5 10.2 10.0  NEUTROABS  --  5.1  --   --  HGB 9.5* 8.7* 9.4* 8.5*  HCT 29.5* 27.0* 28.9* 26.3*  MCV 90.2 89.7 89.2 89.8  PLT 255 287 301 301   Cardiac Enzymes: No results found for this basename: CKTOTAL, CKMB, CKMBINDEX, TROPONINI,  in the last 168 hours BNP (last 3 results) No results found for this basename: PROBNP,  in the last 8760 hours CBG:  Recent Labs Lab 06/14/12 1209 06/19/12 1143 06/19/12 1713 06/19/12 2108 06/20/12 0725  GLUCAP 82 145* 264* 229* 197*    Recent Results (from the past 240 hour(s))  CULTURE, BLOOD (ROUTINE X 2)     Status: None   Collection Time    06/18/12 12:11 AM      Result Value Range Status   Specimen Description BLOOD RIGHT ARM   Final   Special Requests BOTTLES DRAWN  AEROBIC AND ANAEROBIC 10CC EACH   Final   Culture  Setup Time 06/18/2012 16:24   Final   Culture     Final   Value:        BLOOD CULTURE RECEIVED NO GROWTH TO DATE CULTURE WILL BE HELD FOR 5 DAYS BEFORE ISSUING A FINAL NEGATIVE REPORT   Report Status PENDING   Incomplete  CULTURE, BLOOD (ROUTINE X 2)     Status: None   Collection Time    06/18/12 12:20 AM      Result Value Range Status   Specimen Description BLOOD HAND RIGHT   Final   Special Requests BOTTLES DRAWN AEROBIC AND ANAEROBIC 10CC EACH   Final   Culture  Setup Time 06/18/2012 16:24   Final   Culture     Final   Value:        BLOOD CULTURE RECEIVED NO GROWTH TO DATE CULTURE WILL BE HELD FOR 5 DAYS BEFORE ISSUING A FINAL NEGATIVE REPORT   Report Status PENDING   Incomplete  URINE CULTURE     Status: None   Collection Time    06/18/12  1:08 AM      Result Value Range Status   Specimen Description URINE, CLEAN CATCH   Final   Special Requests Normal   Final   Culture  Setup Time 06/18/2012 02:17   Final   Colony Count NO GROWTH   Final   Culture NO GROWTH   Final   Report Status 06/19/2012 FINAL   Final  CULTURE, BLOOD (ROUTINE X 2)     Status: None   Collection Time    06/19/12 10:50 AM      Result Value Range Status   Specimen Description BLOOD LEFT ARM   Final   Special Requests BOTTLES DRAWN AEROBIC AND ANAEROBIC 10CC   Final   Culture  Setup Time 06/19/2012 14:12   Final   Culture     Final   Value:        BLOOD CULTURE RECEIVED NO GROWTH TO DATE CULTURE WILL BE HELD FOR 5 DAYS BEFORE ISSUING A FINAL NEGATIVE REPORT   Report Status PENDING   Incomplete  CULTURE, BLOOD (ROUTINE X 2)     Status: None   Collection Time    06/19/12 10:55 AM      Result Value Range Status   Specimen Description BLOOD RIGHT HAND   Final   Special Requests BOTTLES DRAWN AEROBIC ONLY 10CC   Final   Culture  Setup Time 06/19/2012 14:13   Final   Culture     Final   Value:        BLOOD CULTURE RECEIVED NO GROWTH TO  DATE CULTURE WILL BE  HELD FOR 5 DAYS BEFORE ISSUING A FINAL NEGATIVE REPORT   Report Status PENDING   Incomplete     Studies: No results found.  Scheduled Meds: . amLODipine  5 mg Oral Daily  . aspirin EC  81 mg Oral q morning - 10a  . atorvastatin  20 mg Oral q1800  . clopidogrel  75 mg Oral Daily  . docusate sodium  100 mg Oral BID  . doxazosin  0.5 mg Oral QHS  . heparin  5,000 Units Subcutaneous Q8H  . insulin aspart  0-9 Units Subcutaneous TID WC  . insulin detemir  18 Units Subcutaneous QHS  . piperacillin-tazobactam (ZOSYN)  IV  3.375 g Intravenous Q8H  . polyethylene glycol  17 g Oral BID  . pregabalin  200 mg Oral BID  . sodium chloride  3 mL Intravenous Q12H  . sodium chloride  3 mL Intravenous Q12H  . vancomycin  1,000 mg Intravenous Q24H   Continuous Infusions:   Principal Problem:   Fever and chills Active Problems:   HTN (hypertension)   CAD (coronary artery disease)   PAD (peripheral artery disease)   CKD (chronic kidney disease) stage 3, GFR 30-59 ml/min   S/P BKA (below knee amputation)   Diabetic neuropathy, type II diabetes mellitus   Cellulitis of foot, right   Anemia    Time spent: > 35 mins    [facility]  [facility] Pager [phone]. If 7PM-7AM, please contact night-coverage at [email or web address], password [facility] 06/20/2012, 11:45 AM  LOS: 1 day

## 2012-06-20 NOTE — Progress Notes (Addendum)
[facility] [place]  Postoperative Visit - Amputation  Date of Surgery: 06/12/12 Procedure: Amputation right first toe with resection of metatarsal head  Preoperative diagnosis: Gangrene right first toe   Subjective Aaron Long is a 72 y.o. male who is 8 days S/P Right great toe amp . Readmitted with cellulitis in the right foot. Now on IV ABX. Pt states foot feels better today   Significant Diagnostic Studies: CBC Lab Results  Component Value Date   WBC 10.0 06/20/2012   HGB 8.5* 06/20/2012   HCT 26.3* 06/20/2012   MCV 89.8 06/20/2012   PLT 301 06/20/2012    BMET    Component Value Date/Time   NA 137 06/20/2012 0505   NA 143 05/23/2012 1250   K 4.8 06/20/2012 0505   CL 105 06/20/2012 0505   CO2 25 06/20/2012 0505   GLUCOSE 241* 06/20/2012 0505   GLUCOSE 84 05/23/2012 1250   BUN 39* 06/20/2012 0505   BUN 39* 05/23/2012 1250   CREATININE 2.69* 06/20/2012 0505   CALCIUM 9.0 06/20/2012 0505   GFRNONAA 22* 06/20/2012 0505   GFRAA 26* 06/20/2012 0505    COAG Lab Results  Component Value Date   INR 1.08 06/19/2012   INR 1.02 01/31/2012   INR 1.98* 01/28/2012   No results found for this basename: PTT     Intake/Output Summary (Last 24 hours) at 06/20/12 0753 Last data filed at 06/20/12 0000  Gross per 24 hour  Intake    480 ml  Output    450 ml  Net     30 ml   Patient Vitals for the past 24 hrs:  Urine Occurrence  06/20/12 0300 1  06/19/12 1356 0     Physical Examination  BP Readings from Last 3 Encounters:  06/20/12 140/62  06/18/12 130/64  06/14/12 186/70   Temp Readings from Last 3 Encounters:  06/20/12 97.8 F (36.6 C) Oral  06/17/12 98.2 F (36.8 C) Oral  06/14/12 98.6 F (37 C) Oral   SpO2 Readings from Last 3 Encounters:  06/20/12 98%  06/18/12 98%  06/14/12 100%   Pulse Readings from Last 3 Encounters:  06/20/12 79  06/18/12 78  06/14/12 89    Pt is A&Ox3  WDWN male with no complaints  Right amputation wound is clean  and dry.  Cellulitis over dorsum of foot unchanged There is good bone coverage in the stump foot is warm and well perfused, without drainage;    Assessment/plan:  Aaron Long is a 72 y.o. male who is s/p Right great toe amp Cellulitis dorsum of foot - stable - cont IV ABX - WBC 10 Pain in foot improved [name] toe amp - stitches intact, no drainage noted ESRD per nephrology If cellulitis does not resolve or pain in foot does not improve may need further amputation ROCZNIAK,REGINA J 7:53 AM 06/20/2012  Pain improved still with some erythema No real drainage. Watchful waiting for now.  Fabienne Bruns, MD [facility] [place] Office: [phone] Pager: [phone]

## 2012-06-20 NOTE — Progress Notes (Signed)
Resting in bed, resp. Even and regular, no acute distress noted.  Call light in reach.

## 2012-06-20 NOTE — Progress Notes (Signed)
UR COMPLETED  

## 2012-06-20 NOTE — Evaluation (Signed)
Physical Therapy Evaluation Patient Details Name: Aaron Long MRN: [id number] DOB: June 21, 1940 Today's Date: 06/20/2012 Time: 4098-1191 PT Time Calculation (min): 26 min  PT Assessment / Plan / Recommendation Clinical Impression    Pt admitted with cellulitis of rt foot. 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 Pt currently with functional limitations due to the deficits listed below (PT Problem List). Pt will benefit from skilled PT to increase their independence and safety with mobility to allow discharge home with wife.      PT Assessment  Patient needs continued PT services    Follow Up Recommendations  Home health PT;Supervision for mobility/OOB    Does the patient have the potential to tolerate intense rehabilitation      Barriers to Discharge        Equipment Recommendations  Other (comment) (TBA)    Recommendations for Other Services     Frequency Min 3X/week    Precautions / Restrictions Precautions Precautions: Fall Required Braces or Orthoses: Other Brace/Splint Other Brace/Splint: Prosthesis Lt. LE, specialized boot for rt foot. Restrictions RLE Weight Bearing: Weight bearing as tolerated   Pertinent Vitals/Pain N/A      Mobility  Bed Mobility Bed Mobility: Left Sidelying to Sit;Sitting - Scoot to Edge of Bed Left Sidelying to Sit: 3: Mod assist;With rails;HOB elevated Sitting - Scoot to Edge of Bed: 4: Min assist Details for Bed Mobility Assistance: Assist to bring trunk up into sitting.  Assist for posterior balance while scooting to EOB. Transfers Transfers: Lateral/Scoot Transfers Lateral/Scoot Transfers: 1: +2 Total assist Lateral Transfers: Patient Percentage: 90% Details for Transfer Assistance: Performed scoot transfer from bed to drop arm recliner toward rt.  2 people for safety (1 in front and 1 behind) to insure pt didn't slide forward. Ambulation/Gait Ambulation/Gait Assistance: Not tested (comment) (Pt's  prosthesis not here.)    Exercises     PT Diagnosis: Difficulty walking;Generalized weakness  PT Problem List: Decreased strength;Decreased balance;Decreased mobility;Decreased knowledge of use of DME PT Treatment Interventions: DME instruction;Gait training;Functional mobility training;Therapeutic activities;Balance training;Patient/family education;Therapeutic exercise   PT Goals Acute Rehab PT Goals PT Goal Formulation: With patient Time For Goal Achievement: 06/27/12 Potential to Achieve Goals: Good Pt will go Supine/Side to Sit: with supervision PT Goal: Supine/Side to Sit - Progress: Goal set today Pt will go Sit to Supine/Side: with supervision PT Goal: Sit to Supine/Side - Progress: Goal set today Pt will go Sit to Stand: with min assist PT Goal: Sit to Stand - Progress: Goal set today Pt will go Stand to Sit: with min assist PT Goal: Stand to Sit - Progress: Goal set today Pt will Transfer Bed to Chair/Chair to Bed: with supervision PT Transfer Goal: Bed to Chair/Chair to Bed - Progress: Goal set today Pt will Ambulate: 16 - 50 feet;with least restrictive assistive device;with min assist PT Goal: Ambulate - Progress: Goal set today PT Goal: Up/Down Stairs - Progress: Discontinued (comment)  Visit Information  Last PT Received On: 06/20/12 Assistance Needed: +2 (to try amb) PT/OT Co-Evaluation/Treatment: Yes    Subjective Data  Subjective: Pt states he doesn't have his prosthesis with him. Patient Stated Goal: Return home   Prior Functioning  Home Living Lives With: Spouse Available Help at Discharge: Family;Available 24 hours/day Type of Home: Apartment Home Access: Level entry Home Layout: One level Bathroom Shower/Tub: Tub/shower unit;Curtain Firefighter: Standard Home Adaptive Equipment: Straight cane;Shower chair without back;Walker - rolling;Wheelchair - manual Prior Function Level of  Independence: Needs assistance Needs Assistance:  Gait;Transfers Gait Assistance: 2 person assist for very short distance since toe amputation last week. Transfer Assistance: Mod A of wife at dc last week. Driving: Yes Vocation: On disability Communication Communication: No difficulties    Cognition  Cognition Arousal/Alertness: Awake/alert Behavior During Therapy: WFL for tasks assessed/performed Overall Cognitive Status: Within Functional Limits for tasks assessed    Extremity/Trunk Assessment Right Lower Extremity Assessment RLE ROM/Strength/Tone: Deficits RLE ROM/Strength/Tone Deficits: grossly 4-/5 Left Lower Extremity Assessment LLE ROM/Strength/Tone: Deficits LLE ROM/Strength/Tone Deficits: grossly 4/5 for residual    Balance Static Standing Balance Static Standing - Balance Support: Bilateral upper extremity supported Static Standing - Level of Assistance: 5: Stand by assistance  End of Session PT - End of Session Activity Tolerance: Patient tolerated treatment well Patient left: in chair;with call bell/phone within reach;with family/visitor present Nurse Communication: Mobility status  GP     Devanny Palecek 06/20/2012, 11:01 AM  Elianys Conry PT [phone]

## 2012-06-20 NOTE — Evaluation (Addendum)
Occupational Therapy Evaluation Patient Details Name: Aaron Long MRN: [id number] DOB: 10-21-1940 Today's Date: 06/20/2012 Time: 4098-1191 OT Time Calculation (min): 33 min  OT Assessment / Plan / Recommendation Clinical Impression  This 72 yo male admitted with cellulitis in right foot (recent right great toe amputation presents to acute OT with problems below. Will benefit from acute with follow up HHOT.    OT Assessment  Patient needs continued OT Services    Follow Up Recommendations  Home health OT;Supervision/Assistance - 24 hour    Barriers to Discharge None    Equipment Recommendations  Tub/shower bench       Frequency  Min 2X/week    Precautions / Restrictions Precautions Precautions: Fall Required Braces or Orthoses: Other Brace/Splint Other Brace/Splint: Prosthesis Lt. LE, specialized boot for rt foot. Restrictions RLE Weight Bearing: Weight bearing as tolerated       ADL  Eating/Feeding: Simulated;Independent Where Assessed - Eating/Feeding: Edge of bed Grooming: Simulated;Set up Where Assessed - Grooming: Unsupported sitting Upper Body Bathing: Simulated;Set up Where Assessed - Upper Body Bathing: Unsupported sitting Lower Body Bathing: Moderate assistance;Simulated Where Assessed - Lower Body Bathing: Lean right and/or left Upper Body Dressing: Set up;Simulated Where Assessed - Upper Body Dressing: Unsupported sitting Lower Body Dressing: Simulated;Maximal assistance Where Assessed - Lower Body Dressing: Lean right and/or left Toilet Transfer: Simulated;+2 Total assistance Toilet Transfer: Patient Percentage: 90% Toilet Transfer Method:  (drop arm recliner going to pt's right) Toileting - Clothing Manipulation and Hygiene: Simulated;+1 Total assistance Where Assessed - Glass blower/designer Manipulation and Hygiene: Lean right and/or left (or partial stand on heel of right foot) Transfers/Ambulation Related to ADLs: total A +2 (pt=90%) scooting to  right from bed (raised) to recliner (drop arm)--one person in front and one in back mainly for safety    OT Diagnosis: Generalized weakness  OT Problem List: Decreased strength;Impaired balance (sitting and/or standing);Decreased knowledge of use of DME or AE OT Treatment Interventions: Self-care/ADL training;DME and/or AE instruction;Patient/family education;Therapeutic exercise;Balance training   OT Goals Acute Rehab OT Goals OT Goal Formulation: With patient Potential to Achieve Goals: Good ADL Goals Pt Will Perform Grooming: with supervision;Unsupported;Standing at sink (2 tasks) ADL Goal: Grooming - Progress: Goal set today Pt Will Perform Lower Body Bathing: Sit to stand from bed;Sit to stand in shower;Unsupported;with supervision ADL Goal: Lower Body Bathing - Progress: Goal set today Pt Will Perform Lower Body Dressing: with supervision;Sit to stand from chair;Sit to stand from bed;Unsupported;with adaptive equipment ADL Goal: Lower Body Dressing - Progress: Goal set today Pt Will Transfer to Toilet: with supervision;Ambulation;with DME;3-in-1 (over toilet) ADL Goal: Toilet Transfer - Progress: Goal set today Pt Will Perform Toileting - Clothing Manipulation: with supervision;Standing ADL Goal: Toileting - Clothing Manipulation - Progress: Goal set today Pt Will Perform Toileting - Hygiene: with supervision;Sit to stand from 3-in-1/toilet ADL Goal: Toileting - Hygiene - Progress: Goal set today Arm Goals Pt Will Complete Theraband Exer: Independently;Bilateral upper extremities;1 set;10 reps;Level 3 Theraband Arm Goal: Theraband Exercises - Progress: Goal set today  Visit Information  Last OT Received On: 06/20/12 Assistance Needed: +2 (to try amb) PT/OT Co-Evaluation/Treatment: Yes (partial)    Subjective Data  Subjective: "I don't have my prothesis here to day"  (wife to bring it in tomorrow)   Prior Functioning     Home Living Lives With: Spouse Available Help at  Discharge: Family;Available 24 hours/day Type of Home: Apartment Home Access: Level entry Home Layout: One level Bathroom Shower/Tub: Tub/shower unit;Curtain Firefighter: Standard Home  Adaptive Equipment: Straight cane;Shower chair without back;Walker - rolling;Wheelchair - manual Prior Function Level of Independence: Needs assistance Needs Assistance: Gait;Transfers Gait Assistance: 2 person assist for very short distance since toe amputation last week. Transfer Assistance: Mod A of wife at dc last week. Driving: Yes Vocation: On disability Communication Communication: No difficulties         Vision/Perception Vision - History Patient Visual Report: No change from baseline   Cognition  Cognition Arousal/Alertness: Awake/alert Behavior During Therapy: WFL for tasks assessed/performed Overall Cognitive Status: Within Functional Limits for tasks assessed    Extremity/Trunk Assessment Right Upper Extremity Assessment RUE ROM/Strength/Tone: Within functional levels Left Upper Extremity Assessment LUE ROM/Strength/Tone: Within functional levels Right Lower Extremity Assessment RLE ROM/Strength/Tone: Deficits RLE ROM/Strength/Tone Deficits: grossly 4-/5 Left Lower Extremity Assessment LLE ROM/Strength/Tone: Deficits LLE ROM/Strength/Tone Deficits: grossly 4/5 for residual      Mobility Bed Mobility Bed Mobility: Left Sidelying to Sit;Sitting - Scoot to Edge of Bed Left Sidelying to Sit: 3: Mod assist;With rails;HOB elevated Sitting - Scoot to Edge of Bed: 4: Min assist Details for Bed Mobility Assistance: Assist to bring trunk up into sitting.  Assist for posterior balance while scooting to EOB. Transfers Details for Transfer Assistance: Performed scoot transfer from bed to drop arm recliner toward rt.  2 people for safety (1 in front and 1 behind) to insure pt didn't slide forward.        Balance Static Standing Balance Static Standing - Balance Support: Bilateral  upper extremity supported Static Standing - Level of Assistance: 5: Stand by assistance   End of Session OT - End of Session Activity Tolerance: Patient tolerated treatment well Patient left: in chair;with call bell/phone within reach;with family/visitor present Nurse Communication:  (PT to talk with nursing)       Evette Georges  [phone]  06/20/2012, 11:15 AM

## 2012-06-21 DIAGNOSIS — E1169 Type 2 diabetes mellitus with other specified complication: Secondary | ICD-10-CM

## 2012-06-21 DIAGNOSIS — I10 Essential (primary) hypertension: Secondary | ICD-10-CM

## 2012-06-21 DIAGNOSIS — L97509 Non-pressure chronic ulcer of other part of unspecified foot with unspecified severity: Secondary | ICD-10-CM

## 2012-06-21 LAB — GLUCOSE, CAPILLARY
Glucose-Capillary: 218 mg/dL — ABNORMAL HIGH (ref 70–99)
Glucose-Capillary: 159 mg/dL — ABNORMAL HIGH (ref 70–99)
Glucose-Capillary: 222 mg/dL — ABNORMAL HIGH (ref 70–99)
Glucose-Capillary: 240 mg/dL — ABNORMAL HIGH (ref 70–99)

## 2012-06-21 LAB — CBC
Hemoglobin: 7.6 g/dL — ABNORMAL LOW (ref 13.0–17.0)
MCHC: 32.5 g/dL (ref 30.0–36.0)
Platelets: 318 10*3/uL (ref 150–400)

## 2012-06-21 LAB — BASIC METABOLIC PANEL
GFR calc Af Amer: 23 mL/min — ABNORMAL LOW (ref >90)
GFR calc non Af Amer: 20 mL/min — ABNORMAL LOW (ref >90)
Glucose, Bld: 286 mg/dL — ABNORMAL HIGH (ref 70–99)
Potassium: 4.9 mEq/L (ref 3.5–5.1)
Sodium: 137 mEq/L (ref 135–145)

## 2012-06-21 LAB — OCCULT BLOOD X 1 CARD TO LAB, STOOL: Fecal Occult Bld: NEGATIVE

## 2012-06-21 NOTE — Progress Notes (Signed)
[facility] PROGRESS NOTE  Aaron Long WGN:[id number] DOB: 21-Nov-1940 DOA: 06/19/2012 PCP: Oneal Grout, MD  Assessment/Plan: #1 right forefoot cellulitis Fevers and improving. Patient states right foot pain improving. Still with some erythema however decreased tenderness to palpation. Blood cultures are pending. Continue empiric IV vancomycin and Zosyn. Vascular surgery following and if no significant improvement may require amputation.  #2 fever/chills Likely secondary to problem #1. Blood cultures are pending. Urinalysis is negative. Continue empiric IV vancomycin and Zosyn.  #3 status post recent right great toe amputation Continue pain management. Per vascular surgery.  #4 hypertension Continue current regimen of Norvasc, Cardura.  #5 chronic kidney disease stage III Stable.  #6 type 2 diabetes Hemoglobin A1c 7.8. CBGs have ranged from 172 - 241. Increase Levemir to 20 units daily. Sliding scale insulin.  #7 severe iron deficiency anemia H&H stable. Will place on IV iron.  #8 severe peripheral vascular disease Continue aspirin and Plavix.  #9 history of coronary artery disease/hyperlipidemia Stable. Continue aspirin, Norvasc, Plavix, statin.  #10 chronic constipation Continue Colace and MiraLAX.  #11 prophylaxis Heparin for DVT prophylaxis.  D/C telemetry  Code Status: Full Family Communication: Updated patient wife and daughter at bedside Disposition Plan: Home when medically stable.    Consultants:  Vascular surgery : Dr Darrick Penna 06/19/12  Procedures:  None  Antibiotics:  IV Vancomycin 06/19/12  IV Zosyn 06/19/12  HPI/Subjective: Patient states pain improved. Feeling better.  Objective: Filed Vitals:   06/20/12 1626 06/20/12 2113 06/21/12 0525 06/21/12 0921  BP: 156/63 144/77 149/73 132/62  Pulse: 77 88 76 83  Temp: 99.2 F (37.3 C) 99.5 F (37.5 C) 98.6 F (37 C) 99.4 F (37.4 C)  TempSrc:  Oral Oral Oral  Resp: 18 18 18 18    Height:  5' 8.11" (1.73 m)    Weight:  91.882 kg (202 lb 9 oz)    SpO2: 96% 96% 96% 99%    Intake/Output Summary (Last 24 hours) at 06/21/12 1250 Last data filed at 06/21/12 0935  Gross per 24 hour  Intake   1500 ml  Output   1105 ml  Net    395 ml   Filed Weights   06/19/12 1103 06/20/12 2113  Weight: 95 kg (209 lb 7 oz) 91.882 kg (202 lb 9 oz)    Exam:   General:   NAD  Cardiovascular: RRR  Respiratory: CTAB  Abdomen: Soft/NT/ND/+BS  Extremities: RLE with some erythema, less TTP. S/p LBKA  Data Reviewed: Basic Metabolic Panel:  Recent Labs Lab 06/17/12 2355 06/19/12 0934 06/19/12 1200 06/20/12 0505 06/21/12 0510  NA 134* 137  --  137 137  K 5.6* 4.9  --  4.8 4.9  CL 103 104  --  105 102  CO2 24 23  --  25 24  GLUCOSE 290* 172*  --  241* 286*  BUN 49* 39*  --  39* 41*  CREATININE 2.99* 2.60*  --  2.69* 2.98*  CALCIUM 9.2 8.8  --  9.0 9.0  MG  --   --  1.9  --   --    Liver Function Tests:  Recent Labs Lab 06/17/12 2355  AST 20  ALT 12  ALKPHOS 129*  BILITOT 0.2*  PROT 6.9  ALBUMIN 2.7*   No results found for this basename: LIPASE, AMYLASE,  in the last 168 hours No results found for this basename: AMMONIA,  in the last 168 hours CBC:  Recent Labs Lab 06/17/12 2355 06/19/12 0835 06/20/12 0505 06/21/12 0510  WBC 7.5 10.2 10.0 10.3  NEUTROABS 5.1  --   --   --   HGB 8.7* 9.4* 8.5* 7.6*  HCT 27.0* 28.9* 26.3* 23.4*  MCV 89.7 89.2 89.8 89.0  PLT 287 301 301 318   Cardiac Enzymes: No results found for this basename: CKTOTAL, CKMB, CKMBINDEX, TROPONINI,  in the last 168 hours BNP (last 3 results) No results found for this basename: PROBNP,  in the last 8760 hours CBG:  Recent Labs Lab 06/20/12 1135 06/20/12 1628 06/20/12 2111 06/21/12 0759 06/21/12 1142  GLUCAP 198* 181* 214* 218* 159*    Recent Results (from the past 240 hour(s))  CULTURE, BLOOD (ROUTINE X 2)     Status: None   Collection Time    06/18/12 12:11 AM       Result Value Range Status   Specimen Description BLOOD RIGHT ARM   Final   Special Requests BOTTLES DRAWN AEROBIC AND ANAEROBIC 10CC EACH   Final   Culture  Setup Time 06/18/2012 16:24   Final   Culture     Final   Value:        BLOOD CULTURE RECEIVED NO GROWTH TO DATE CULTURE WILL BE HELD FOR 5 DAYS BEFORE ISSUING A FINAL NEGATIVE REPORT   Report Status PENDING   Incomplete  CULTURE, BLOOD (ROUTINE X 2)     Status: None   Collection Time    06/18/12 12:20 AM      Result Value Range Status   Specimen Description BLOOD HAND RIGHT   Final   Special Requests BOTTLES DRAWN AEROBIC AND ANAEROBIC 10CC EACH   Final   Culture  Setup Time 06/18/2012 16:24   Final   Culture     Final   Value:        BLOOD CULTURE RECEIVED NO GROWTH TO DATE CULTURE WILL BE HELD FOR 5 DAYS BEFORE ISSUING A FINAL NEGATIVE REPORT   Report Status PENDING   Incomplete  URINE CULTURE     Status: None   Collection Time    06/18/12  1:08 AM      Result Value Range Status   Specimen Description URINE, CLEAN CATCH   Final   Special Requests Normal   Final   Culture  Setup Time 06/18/2012 02:17   Final   Colony Count NO GROWTH   Final   Culture NO GROWTH   Final   Report Status 06/19/2012 FINAL   Final  CULTURE, BLOOD (ROUTINE X 2)     Status: None   Collection Time    06/19/12 10:50 AM      Result Value Range Status   Specimen Description BLOOD LEFT ARM   Final   Special Requests BOTTLES DRAWN AEROBIC AND ANAEROBIC 10CC   Final   Culture  Setup Time 06/19/2012 14:12   Final   Culture     Final   Value:        BLOOD CULTURE RECEIVED NO GROWTH TO DATE CULTURE WILL BE HELD FOR 5 DAYS BEFORE ISSUING A FINAL NEGATIVE REPORT   Report Status PENDING   Incomplete  CULTURE, BLOOD (ROUTINE X 2)     Status: None   Collection Time    06/19/12 10:55 AM      Result Value Range Status   Specimen Description BLOOD RIGHT HAND   Final   Special Requests BOTTLES DRAWN AEROBIC ONLY 10CC   Final   Culture  Setup Time 06/19/2012  14:13   Final   Culture  Final   Value:        BLOOD CULTURE RECEIVED NO GROWTH TO DATE CULTURE WILL BE HELD FOR 5 DAYS BEFORE ISSUING A FINAL NEGATIVE REPORT   Report Status PENDING   Incomplete  URINE CULTURE     Status: None   Collection Time    06/19/12 12:52 PM      Result Value Range Status   Specimen Description URINE, RANDOM   Final   Special Requests NONE   Final   Culture  Setup Time 06/19/2012 18:40   Final   Colony Count NO GROWTH   Final   Culture NO GROWTH   Final   Report Status 06/20/2012 FINAL   Final     Studies: No results found.  Scheduled Meds: . amLODipine  5 mg Oral Daily  . aspirin EC  81 mg Oral q morning - 10a  . atorvastatin  20 mg Oral q1800  . clopidogrel  75 mg Oral Daily  . docusate sodium  100 mg Oral BID  . doxazosin  0.5 mg Oral QHS  . heparin  5,000 Units Subcutaneous Q8H  . insulin aspart  0-9 Units Subcutaneous TID WC  . insulin detemir  20 Units Subcutaneous QHS  . piperacillin-tazobactam (ZOSYN)  IV  3.375 g Intravenous Q8H  . polyethylene glycol  17 g Oral BID  . pregabalin  200 mg Oral BID  . sodium chloride  3 mL Intravenous Q12H  . sodium chloride  3 mL Intravenous Q12H  . vancomycin  1,000 mg Intravenous Q24H   Continuous Infusions:   Time spent: > 35 mins  Meagon Duskin L  [facility] Pager [phone]. If 7PM-7AM, please contact night-coverage at [email or web address], password [facility] 06/21/2012, 12:50 PM  LOS: 2 days

## 2012-06-21 NOTE — Progress Notes (Signed)
Pt with less pain Erythema in foot considerably improved Still with marginal skin edge at toe amp site. Continue IV antibiotics for now  Fabienne Bruns, MD [facility] Office: [phone] Pager: [phone]

## 2012-06-21 NOTE — Progress Notes (Signed)
Occupational Therapy Treatment Patient Details Name: Aaron Long MRN: [id number] DOB: [birth year] Today's Date: 06/21/2012 Time: 4401-0272 OT Time Calculation (min): 23 min  OT Assessment / Plan / Recommendation Comments on Treatment Session pt did well tolerating BUE exercise    Follow Up Recommendations  Home health OT;Supervision/Assistance - 24 hour       Equipment Recommendations  Tub/shower bench       Frequency Min 2X/week   Plan      Precautions / Restrictions Precautions Precautions: Fall Required Braces or Orthoses: Other Brace/Splint Other Brace/Splint: Prosthesis Lt. LE, specialized boot for rt foot. Restrictions Weight Bearing Restrictions: Yes RLE Weight Bearing: Weight bearing as tolerated       ADL  Toilet Transfer: Simulated;Maximal assistance Toilet Transfer Method: Sit to stand Toilet Transfer Equipment: Other (comment) (sit to stand from chair) Transfers/Ambulation Related to ADLs: Pt had just finished with PT, but was agreeable to BUe exercise. Pt perfomed 5 sets of 10 tricep push ups in recliner. Pt stated he had theraband at home and wife would bring tomorrow. Educated pt on correct form for tricep pushups, as well as benefits and importance      OT Goals Acute Rehab OT Goals OT Goal Formulation: With patient ADL Goals Pt Will Perform Lower Body Bathing: with caregiver independent in assisting Arm Goals Additional Arm Goal #2: Pt will perform tricep push ups from seated position in preparation for increased I with ADL activity I ly.  Visit Information  Last OT Received On: 06/21/12 Assistance Needed: +2    Subjective Data  Subjective: This feels good to do ( UE exercise)      Cognition  Cognition Arousal/Alertness: Awake/alert Behavior During Therapy: WFL for tasks assessed/performed Overall Cognitive Status: Within Functional Limits for tasks assessed    Mobility  Bed Mobility Bed Mobility: Left Sidelying to Sit;Sitting - Scoot  to Edge of Bed Left Sidelying to Sit: 3: Mod assist;With rails;HOB elevated Sitting - Scoot to Edge of Bed: 4: Min assist Details for Bed Mobility Assistance: assist to elevate trunk upright from sidelying; min assist with bedpad to scoot to edge of bed Transfers Sit to Stand: 1: +2 Total assist;From bed;With upper extremity assist Sit to Stand: Patient Percentage: 60% Stand to Sit: 3: Mod assist;With upper extremity assist;With armrests;To chair/3-in-1 Details for Transfer Assistance: Pt. practiced sit <>stand from and to bed x2, requiring 2 assist , pt. 60%.  Needed cues to stand erect  and assist to stabilize and balance.          End of Session OT - End of Session Activity Tolerance: Patient tolerated treatment well Patient left: in chair;with call bell/phone within reach;with family/visitor present Nurse Communication:  (PT to talk with nursing)  GO     Einar Crow D 06/21/2012, 10:23 AM

## 2012-06-21 NOTE — Progress Notes (Signed)
Inpatient Diabetes Program Recommendations  AACE/ADA: New Consensus Statement on Inpatient Glycemic Control (2013)  Target Ranges:  Prepandial:   less than 140 mg/dL      Peak postprandial:   less than 180 mg/dL (1-2 hours)      Critically ill patients:  140 - 180 mg/dL   CBG's up and down and all over the board. Noted MD's note stating a plan to increase levemir to 20 units daily. However, I am concerned that the insulins are not being metabolized as quickly as those without renal disease.  Glucose this am at 218 mg/dL, but cbg then 161 mg/dl before lunch with very little correction insulin But due to infection, may do well with 20 units levemir and continue sensitive correction tidwc.    Thank you, Lenor Coffin, RN, CNS, Diabetes Coordinator [phone])

## 2012-06-21 NOTE — Progress Notes (Signed)
Physical Therapy Treatment Patient Details Name: Aaron Long MRN: [id number] DOB: 03-04-1940 Today's Date: 06/21/2012 Time: 4098-1191 PT Time Calculation (min): 37 min  PT Assessment / Plan / Recommendation Comments on Treatment Session  Pt. making progress with mobility but needs more practice with gait.      Follow Up Recommendations  Home health PT;Supervision for mobility/OOB     Does the patient have the potential to tolerate intense rehabilitation     Barriers to Discharge        Equipment Recommendations  Other (comment) (drop arm 3 n I likely needed )    Recommendations for Other Services    Frequency Min 3X/week   Plan Discharge plan remains appropriate    Precautions / Restrictions Precautions Precautions: Fall Restrictions Weight Bearing Restrictions: Yes RLE Weight Bearing: Weight bearing as tolerated   Pertinent Vitals/Pain No pain, no distress    Mobility  Bed Mobility Bed Mobility: Left Sidelying to Sit;Sitting - Scoot to Edge of Bed Left Sidelying to Sit: 3: Mod assist;With rails;HOB elevated Sitting - Scoot to Edge of Bed: 4: Min assist Details for Bed Mobility Assistance: assist to elevate trunk upright from sidelying; min assist with bedpad to scoot to edge of bed Transfers Transfers: Sit to Stand;Stand to Sit Sit to Stand: 1: +2 Total assist;From bed;With upper extremity assist Sit to Stand: Patient Percentage: 60% Stand to Sit: 3: Mod assist;With upper extremity assist;With armrests;To chair/3-in-1 Details for Transfer Assistance: Pt. practiced sit <>stand from and to bed x2, requiring 2 assist , pt. 60%.  Needed cues to stand erect  and assist to stabilize and balance. Ambulation/Gait Ambulation/Gait Assistance: 1: +2 Total assist Ambulation/Gait: Patient Percentage: 70% Ambulation Distance (Feet): 12 Feet Assistive device: Rolling walker Ambulation/Gait Assistance Details: Pt. needs cues for upright posture and 2 assist to stabilize and  balance with RW and using prosthesis. Gait Pattern: Step-to pattern;Decreased step length - right;Decreased step length - left Gait velocity: decreased Stairs: No    Exercises     PT Diagnosis:    PT Problem List:   PT Treatment Interventions:     PT Goals Acute Rehab PT Goals Pt will go Supine/Side to Sit: with supervision PT Goal: Supine/Side to Sit - Progress: Progressing toward goal Pt will go Sit to Stand: with min assist PT Goal: Sit to Stand - Progress: Progressing toward goal Pt will go Stand to Sit: with min assist PT Goal: Stand to Sit - Progress: Progressing toward goal Pt will Ambulate: 16 - 50 feet;with least restrictive assistive device;with min assist PT Goal: Ambulate - Progress: Progressing toward goal  Visit Information  Last PT Received On: 06/21/12 Assistance Needed: +2    Subjective Data  Subjective: Wife brought prostheseis yesterday, now in room   Cognition  Cognition Arousal/Alertness: Awake/alert Behavior During Therapy: WFL for tasks assessed/performed Overall Cognitive Status: Within Functional Limits for tasks assessed    Balance  Static Standing Balance Static Standing - Balance Support: Bilateral upper extremity supported Static Standing - Level of Assistance: 4: Min assist Dynamic Standing Balance Dynamic Standing - Balance Support: No upper extremity supported;During functional activity Dynamic Standing - Level of Assistance: 4: Min assist Dynamic Standing - Balance Activities: Other (comment) (applying prosthesis at edge of bed) Dynamic Standing - Comments: 5  End of Session PT - End of Session Equipment Utilized During Treatment: Gait belt Activity Tolerance: Patient tolerated treatment well Patient left: in chair;with call bell/phone within reach;with family/visitor present (wife present) Nurse Communication: Mobility status  GP     Ferman Hamming 06/21/2012, 9:28 AM Weldon Picking PT Acute Rehab Services  [phone] [facility] [phone]

## 2012-06-22 ENCOUNTER — Ambulatory Visit: Payer: Medicare Other | Admitting: Vascular Surgery

## 2012-06-22 ENCOUNTER — Inpatient Hospital Stay (HOSPITAL_COMMUNITY): Payer: Medicare Other

## 2012-06-22 DIAGNOSIS — I739 Peripheral vascular disease, unspecified: Secondary | ICD-10-CM

## 2012-06-22 LAB — GLUCOSE, CAPILLARY
Glucose-Capillary: 148 mg/dL — ABNORMAL HIGH (ref 70–99)
Glucose-Capillary: 181 mg/dL — ABNORMAL HIGH (ref 70–99)
Glucose-Capillary: 185 mg/dL — ABNORMAL HIGH (ref 70–99)

## 2012-06-22 MED ORDER — SODIUM CHLORIDE 0.9 % IJ SOLN
10.0000 mL | INTRAMUSCULAR | Status: DC | PRN
  Administered 2012-06-23 – 2012-06-26 (×3): 10 mL

## 2012-06-22 MED ORDER — CLOPIDOGREL BISULFATE 75 MG PO TABS
75.0000 mg | ORAL_TABLET | Freq: Every day | ORAL | Status: DC
  Administered 2012-06-23 – 2012-06-26 (×4): 75 mg via ORAL
  Filled 2012-06-22 (×5): qty 1

## 2012-06-22 MED ORDER — ASPIRIN EC 81 MG PO TBEC
81.0000 mg | DELAYED_RELEASE_TABLET | Freq: Every day | ORAL | Status: DC
  Administered 2012-06-23 – 2012-06-26 (×4): 81 mg via ORAL
  Filled 2012-06-22 (×4): qty 1

## 2012-06-22 NOTE — Progress Notes (Signed)
Right foot looks unchanged still some erythema and edema, marginal skin edges at amputation   Filed Vitals:   06/21/12 1333 06/21/12 1820 06/21/12 2245 06/22/12 0516  BP: 151/78 166/79 165/67 153/82  Pulse: 79 86 88 76  Temp: 100.2 F (37.9 C) 98.1 F (36.7 C) 99.5 F (37.5 C) 98.6 F (37 C)  TempSrc: Axillary Oral Oral Oral  Resp: 18 19 18 18   Height:   5' 8.11" (1.73 m)   Weight:   200 lb 9.6 oz (90.992 kg)   SpO2: 95% 100% 95% 93%    A: Cellulitis right foot, possible osteomyelitis P: would favor having interventional radiology place central line not PICC for 6 weeks of IV antibiotics.  Will arrange follow up with me in 1 week  Fabienne Bruns, MD [facility] [place] Office: [phone] Pager: [phone]

## 2012-06-22 NOTE — Procedures (Signed)
Placement of tunneled central venous catheter for antibiotics.  Tip is near the cavoatrial junction and ready to use.  No immediate complication.

## 2012-06-22 NOTE — Progress Notes (Signed)
Physical Therapy Treatment Patient Details Name: Aaron Long MRN: [id number] DOB: June 19, 1940 Today's Date: 06/22/2012 Time: 4098-1191 PT Time Calculation (min): 28 min  PT Assessment / Plan / Recommendation Comments on Treatment Session  Pt is making progress with mobility and is able to apply prosthesis with only min assist for balance when reaching forward. He is limited in gait because he feels he has edema in residual limb as he had not been wearing shrinker for several days , Pt is wearing shrinker now and anticipates he will be able to return to ambulation soon    Follow Up Recommendations  Home health PT;Supervision for mobility/OOB     Does the patient have the potential to tolerate intense rehabilitation     Barriers to Discharge        Equipment Recommendations   (possibly drop arm 3 in 1)    Recommendations for Other Services    Frequency Min 3X/week   Plan Discharge plan remains appropriate    Precautions / Restrictions Precautions Precautions: Fall Required Braces or Orthoses: Other Brace/Splint Other Brace/Splint: Prosthesis Lt. LE, specialized boot for rt foot. Restrictions RLE Weight Bearing: Weight bearing as tolerated   Pertinent Vitals/Pain Pt c/o pain in residual limb with walking    Mobility  Bed Mobility Bed Mobility: Left Sidelying to Sit;Sitting - Scoot to Edge of Bed Left Sidelying to Sit: 3: Mod assist;With rails;HOB elevated;4: Min assist Supine to Sit: 6: Modified independent (Device/Increase time) Sitting - Scoot to Edge of Bed: 6: Modified independent (Device/Increase time) Details for Bed Mobility Assistance: verbal cues to bring left leg up and push on heel to initiate rolling. From sidelying, pt was able to push up into sitting with extra time Transfers Transfers: Sit to Stand;Stand to Sit Sit to Stand: 1: +2 Total assist;From bed;With upper extremity assist;From elevated surface Sit to Stand: Patient Percentage: 80% Stand to Sit: 3:  Mod assist;With upper extremity assist;With armrests;To chair/3-in-1 Details for Transfer Assistance: pt needs extra time, but is able to perform sit to stand transfer Ambulation/Gait Ambulation/Gait Assistance: 3: Mod assist Ambulation Distance (Feet): 3 Feet Assistive device: Rolling walker Ambulation/Gait Assistance Details: cues to stand erect.  Gait Pattern: Step-to pattern;Decreased step length - right;Decreased step length - left Gait velocity: decreased General Gait Details: Pt c/o pain in leg when weight bearing on prosthesis.  Walking stopped to avoid skin irritation.  Pt will use shrinker to decrease limb before resuming distance ambulation Stairs: No    Exercises Other Exercises Other Exercises: armchair pushups x 10 with good height of elevation   PT Diagnosis:    PT Problem List:   PT Treatment Interventions:     PT Goals Acute Rehab PT Goals Pt will go Supine/Side to Sit: with supervision PT Goal: Supine/Side to Sit - Progress: Progressing toward goal Pt will go Sit to Stand: with min assist PT Goal: Sit to Stand - Progress: Progressing toward goal Pt will go Stand to Sit: with min assist PT Goal: Stand to Sit - Progress: Progressing toward goal Pt will Ambulate: 16 - 50 feet;with least restrictive assistive device;with min assist PT Goal: Ambulate - Progress: Progressing toward goal  Visit Information  Last PT Received On: 06/22/12    Subjective Data  Subjective: Pt states his leg is still not going down far enough to bear weight correctly.  He wants to wear his shrinker more to get his residual limb smaller before he walks much on it Patient Stated Goal: Return home   Cognition  Cognition Arousal/Alertness: Awake/alert Behavior During Therapy: WFL for tasks assessed/performed Overall Cognitive Status: Within Functional Limits for tasks assessed    Balance  Dynamic Sitting Balance Dynamic Sitting - Balance Activities: Forward lean/weight shifting;Reaching  for objects Dynamic Sitting - Comments: pt needed some assist to maintain balance while he was reaching to apply the various components of prosthesis  End of Session PT - End of Session Equipment Utilized During Treatment: Gait belt Activity Tolerance: Patient limited by pain Patient left: in chair;with call bell/phone within reach   GP     Donnetta Hail 06/22/2012, 1:30 PM

## 2012-06-22 NOTE — Progress Notes (Signed)
[facility]  Patient Status: Active (receiving services up to time of hospitalization)  [facility] is providing the following services: RN, PT, OT and HHA  If patient discharges after hours, please call [phone].   Aaron Long 06/22/2012, 11:50 AM

## 2012-06-22 NOTE — Progress Notes (Signed)
Dr. Darrick Penna note reviewed.  [facility] PROGRESS NOTE  Aaron Long WUJ:[id number] DOB: Apr 30, 1940 DOA: 06/19/2012 PCP: Oneal Grout, MD  Assessment/Plan: right forefoot cellulitis Looks about the same. Dr. Darrick Penna is recommending IR to place a central line in 6 weeks of home IV antibiotics. Have consulted IR. Patient is agreeable to this plan.  status post recent right great toe amputation  hypertension Continue current regimen of Norvasc, Cardura.   chronic kidney disease stage 3 - 4 Stable. Followed by Dr. Caryn Section as outpatient.    #6 type 2 diabetes CBGs better  #7 severe iron deficiency anemia H&H stable. Will place on IV iron.  #8 severe peripheral vascular disease with Right lower extremity arteriogram angioplasty of right tibio-peroneal trunk on 5/30. On asa and plavix  #9 history of coronary artery disease/hyperlipidemia Stable. Continue aspirin, Norvasc, Plavix, statin.  #10 chronic constipation Continue Colace and MiraLAX.  #11 prophylaxis Heparin for DVT prophylaxis. Will hold pending central line  D/C telemetry  Code Status: Full Family Communication: Updated patient wife and daughter at bedside Disposition Plan: Home when medically stable.    Consultants:  Vascular surgery : Dr Darrick Penna 06/19/12  IR 6/12  Procedures:  None  Antibiotics:  IV Vancomycin 06/19/12  IV Zosyn 06/19/12  HPI/Subjective: No new complaints  Objective: Filed Vitals:   06/21/12 1820 06/21/12 2245 06/22/12 0516 06/22/12 0918  BP: 166/79 165/67 153/82 147/76  Pulse: 86 88 76 83  Temp: 98.1 F (36.7 C) 99.5 F (37.5 C) 98.6 F (37 C) 98.5 F (36.9 C)  TempSrc: Oral Oral Oral Oral  Resp: 19 18 18 18   Height:  5' 8.11" (1.73 m)    Weight:  90.992 kg (200 lb 9.6 oz)    SpO2: 100% 95% 93% 96%    Intake/Output Summary (Last 24 hours) at 06/22/12 1119 Last data filed at 06/22/12 0929  Gross per 24 hour  Intake    985 ml  Output   1701 ml  Net   -716 ml    Filed Weights   06/19/12 1103 06/20/12 2113 06/21/12 2245  Weight: 95 kg (209 lb 7 oz) 91.882 kg (202 lb 9 oz) 90.992 kg (200 lb 9.6 oz)    Exam:   General:   NAD  Cardiovascular: RRR  Respiratory: CTAB  Abdomen: Soft/NT/ND/+BS  Extremities: right forefoot erythema and induration about the same as yesterday.  No drainage or odor  Data Reviewed: Basic Metabolic Panel:  Recent Labs Lab 06/17/12 2355 06/19/12 0934 06/19/12 1200 06/20/12 0505 06/21/12 0510  NA 134* 137  --  137 137  K 5.6* 4.9  --  4.8 4.9  CL 103 104  --  105 102  CO2 24 23  --  25 24  GLUCOSE 290* 172*  --  241* 286*  BUN 49* 39*  --  39* 41*  CREATININE 2.99* 2.60*  --  2.69* 2.98*  CALCIUM 9.2 8.8  --  9.0 9.0  MG  --   --  1.9  --   --    Liver Function Tests:  Recent Labs Lab 06/17/12 2355  AST 20  ALT 12  ALKPHOS 129*  BILITOT 0.2*  PROT 6.9  ALBUMIN 2.7*   No results found for this basename: LIPASE, AMYLASE,  in the last 168 hours No results found for this basename: AMMONIA,  in the last 168 hours CBC:  Recent Labs Lab 06/17/12 2355 06/19/12 0835 06/20/12 0505 06/21/12 0510 06/22/12 0555  WBC 7.5 10.2 10.0 10.3  --  NEUTROABS 5.1  --   --   --   --   HGB 8.7* 9.4* 8.5* 7.6* 7.8*  HCT 27.0* 28.9* 26.3* 23.4* 24.1*  MCV 89.7 89.2 89.8 89.0  --   PLT 287 301 301 318  --    Cardiac Enzymes: No results found for this basename: CKTOTAL, CKMB, CKMBINDEX, TROPONINI,  in the last 168 hours BNP (last 3 results) No results found for this basename: PROBNP,  in the last 8760 hours CBG:  Recent Labs Lab 06/21/12 0759 06/21/12 1142 06/21/12 1639 06/21/12 2240 06/22/12 0745  GLUCAP 218* 159* 222* 240* 148*    Recent Results (from the past 240 hour(s))  CULTURE, BLOOD (ROUTINE X 2)     Status: None   Collection Time    06/18/12 12:11 AM      Result Value Range Status   Specimen Description BLOOD RIGHT ARM   Final   Special Requests BOTTLES DRAWN AEROBIC AND ANAEROBIC  10CC EACH   Final   Culture  Setup Time 06/18/2012 16:24   Final   Culture     Final   Value:        BLOOD CULTURE RECEIVED NO GROWTH TO DATE CULTURE WILL BE HELD FOR 5 DAYS BEFORE ISSUING A FINAL NEGATIVE REPORT   Report Status PENDING   Incomplete  CULTURE, BLOOD (ROUTINE X 2)     Status: None   Collection Time    06/18/12 12:20 AM      Result Value Range Status   Specimen Description BLOOD HAND RIGHT   Final   Special Requests BOTTLES DRAWN AEROBIC AND ANAEROBIC 10CC EACH   Final   Culture  Setup Time 06/18/2012 16:24   Final   Culture     Final   Value:        BLOOD CULTURE RECEIVED NO GROWTH TO DATE CULTURE WILL BE HELD FOR 5 DAYS BEFORE ISSUING A FINAL NEGATIVE REPORT   Report Status PENDING   Incomplete  URINE CULTURE     Status: None   Collection Time    06/18/12  1:08 AM      Result Value Range Status   Specimen Description URINE, CLEAN CATCH   Final   Special Requests Normal   Final   Culture  Setup Time 06/18/2012 02:17   Final   Colony Count NO GROWTH   Final   Culture NO GROWTH   Final   Report Status 06/19/2012 FINAL   Final  CULTURE, BLOOD (ROUTINE X 2)     Status: None   Collection Time    06/19/12 10:50 AM      Result Value Range Status   Specimen Description BLOOD LEFT ARM   Final   Special Requests BOTTLES DRAWN AEROBIC AND ANAEROBIC 10CC   Final   Culture  Setup Time 06/19/2012 14:12   Final   Culture     Final   Value:        BLOOD CULTURE RECEIVED NO GROWTH TO DATE CULTURE WILL BE HELD FOR 5 DAYS BEFORE ISSUING A FINAL NEGATIVE REPORT   Report Status PENDING   Incomplete  CULTURE, BLOOD (ROUTINE X 2)     Status: None   Collection Time    06/19/12 10:55 AM      Result Value Range Status   Specimen Description BLOOD RIGHT HAND   Final   Special Requests BOTTLES DRAWN AEROBIC ONLY 10CC   Final   Culture  Setup Time 06/19/2012 14:13   Final  Culture     Final   Value:        BLOOD CULTURE RECEIVED NO GROWTH TO DATE CULTURE WILL BE HELD FOR 5 DAYS BEFORE  ISSUING A FINAL NEGATIVE REPORT   Report Status PENDING   Incomplete  URINE CULTURE     Status: None   Collection Time    06/19/12 12:52 PM      Result Value Range Status   Specimen Description URINE, RANDOM   Final   Special Requests NONE   Final   Culture  Setup Time 06/19/2012 18:40   Final   Colony Count NO GROWTH   Final   Culture NO GROWTH   Final   Report Status 06/20/2012 FINAL   Final     Studies: No results found.  Scheduled Meds: . amLODipine  5 mg Oral Daily  . atorvastatin  20 mg Oral q1800  . docusate sodium  100 mg Oral BID  . doxazosin  0.5 mg Oral QHS  . insulin aspart  0-9 Units Subcutaneous TID WC  . insulin detemir  20 Units Subcutaneous QHS  . piperacillin-tazobactam (ZOSYN)  IV  3.375 g Intravenous Q8H  . polyethylene glycol  17 g Oral BID  . pregabalin  200 mg Oral BID  . sodium chloride  3 mL Intravenous Q12H  . sodium chloride  3 mL Intravenous Q12H  . vancomycin  1,000 mg Intravenous Q24H   Continuous Infusions:   Time spent: > 35 mins  Sharmeka Palmisano L  [facility] Pager [phone]. If 7PM-7AM, please contact night-coverage at [email or web address], password [facility] 06/22/2012, 11:19 AM  LOS: 3 days

## 2012-06-22 NOTE — ED Provider Notes (Signed)
I saw and evaluated the patient, reviewed the resident's note and I agree with the findings and plan. Fever recent surgery. Bounce back from yesterday. Will admit. Seen by vascular and admitted to medicine  Juliet Rude. Rubin Payor, MD 06/22/12 1401

## 2012-06-22 NOTE — Care Management Note (Signed)
   CARE MANAGEMENT NOTE 06/22/2012  Patient:  Aaron Long, Aaron Long   Account Number:  [id number]  Date Initiated:  06/22/2012  Documentation initiated by:  Mikeila Burgen  Subjective/Objective Assessment:   Noted pllan to d/c pt with central line and IV antibiotic therapy.     Action/Plan:   06/22/12 Met with pt who is active with [facility], he states that his wife will be able to assist with IV antibiotic administration. [facility] notified of plan and will provide services, antibiotics and supplies.   Anticipated DC Date:  06/23/2012   Anticipated DC Plan:  HOME W HOME HEALTH SERVICES         Choice offered to / List presented to:     DME arranged  IV PUMP/EQUIPMENT      DME agency  [facility]     [facility] arranged  HH-1 RN  HH-2 PT  HH-3 OT      [facility] agency  [facility]   Status of service:  Completed, signed off Medicare Important Message given?   (If response is "NO", the following Medicare IM given date fields will be blank) Date Medicare IM given:   Date Additional Medicare IM given:    Discharge Disposition:  HOME W HOME HEALTH SERVICES  Per UR Regulation:    If discussed at Long Length of Stay Meetings, dates discussed:    Comments:  06/22/2012 Met with pt who is active with [facility] for [facility] services and wishes to continue with that agency. [facility] notified of plan to d/c to home with [facility], PT, OT and IV antibiotics. Await prescription. [facility] services will begin the day of d/c  to home. Johny Shock RN MPH Case Manager [phone]

## 2012-06-22 NOTE — Progress Notes (Signed)
IR requested to place tunneled central line for home IV antibiotics. Chart reviewed. Traditional UE PICC being avoided due to CKD Discussed placement of Rt IJ tunneled PICC. Pt agreeable to procedure with local anesthesia only. Discussed slight increased of bleeding/bruissing as pt on Plavix. Consent signed in chart  Brayton El PA-C Interventional Radiology 06/22/2012 12:16 PM

## 2012-06-23 LAB — GLUCOSE, CAPILLARY
Glucose-Capillary: 187 mg/dL — ABNORMAL HIGH (ref 70–99)
Glucose-Capillary: 279 mg/dL — ABNORMAL HIGH (ref 70–99)
Glucose-Capillary: 234 mg/dL — ABNORMAL HIGH (ref 70–99)
Glucose-Capillary: 280 mg/dL — ABNORMAL HIGH (ref 70–99)

## 2012-06-23 MED ORDER — INSULIN DETEMIR 100 UNIT/ML ~~LOC~~ SOLN
22.0000 [IU] | Freq: Every day | SUBCUTANEOUS | Status: DC
  Administered 2012-06-23 – 2012-06-24 (×2): 22 [IU] via SUBCUTANEOUS
  Filled 2012-06-23 (×3): qty 0.22

## 2012-06-23 MED ORDER — GELATIN ABSORBABLE 12-7 MM EX MISC
CUTANEOUS | Status: AC
  Filled 2012-06-23: qty 1

## 2012-06-23 MED ORDER — VANCOMYCIN HCL 1000 MG IV SOLR
750.0000 mg | INTRAVENOUS | Status: DC
  Administered 2012-06-24 – 2012-06-26 (×3): 750 mg via INTRAVENOUS
  Filled 2012-06-23 (×3): qty 750

## 2012-06-23 MED ORDER — PIPERACILLIN-TAZOBACTAM 3.375 G IVPB: 3.3750 g | Freq: Three times a day (TID) | INTRAVENOUS | Status: DC

## 2012-06-23 MED ORDER — VANCOMYCIN HCL 1000 MG IV SOLR: 1000.0000 mg | INTRAVENOUS | Status: DC

## 2012-06-23 NOTE — Progress Notes (Addendum)
[facility] [place]  Postoperative Visit - Amputation Subjective Aaron Long is a 72 y.o. male who is S/P Right great toe amp who was readmitted with cellulitis in right foot.  .  Pt.denies increased pain in the stump. The patient notes pain is well controlled. Pt.  States foot feels better. He is ambulating with PT. Right IJ tunneled central line placed yesterday.  pt states wife has trouble seeing so unsure whether she will be able to give him the antibiotics  Intake/Output Summary (Last 24 hours) at 06/23/12 0908 Last data filed at 06/23/12 1610  Gross per 24 hour  Intake      0 ml  Output    850 ml  Net   -850 ml   Patient Vitals for the past 24 hrs:  Urine Occurrence  06/22/12 2200 1     Physical Examination  BP Readings from Last 3 Encounters:  06/23/12 174/88  06/18/12 130/64  06/14/12 186/70   Temp Readings from Last 3 Encounters:  06/23/12 98.4 F (36.9 C) Oral  06/17/12 98.2 F (36.8 C) Oral  06/14/12 98.6 F (37 C) Oral   SpO2 Readings from Last 3 Encounters:  06/23/12 98%  06/18/12 98%  06/14/12 100%   Pulse Readings from Last 3 Encounters:  06/23/12 80  06/18/12 78  06/14/12 89    Pt is A&Ox3  WDWN male with no complaints  Right amputation wound is dry, cellulitis in foot is decreased  Right foot is warm  Assessment/plan:  Aaron Long is a 72 y.o. male who is s/p Right great toe amp admitted with cellulitis in the foot Cellulitis now improved wit IV Vanc and Zosyn  will need 6 weeks total of ABX and close monitoring of renal function as out pt as he has CRI SCR stable 2.6-2.9 range  [name] stump is clean, dry, intact.  Follow-up 1 week  HH PT and RN  ROCZNIAK,REGINA J 9:08 AM 06/23/2012   History and exam as above He will see me in the office next week Will see if toe amp will heal 6 weeks antibiotics If foot worsens will need BKA  Fabienne Bruns, MD [facility]  [place] Office: [phone] Pager: [phone]

## 2012-06-23 NOTE — Progress Notes (Signed)
Inpatient Diabetes Program Recommendations  AACE/ADA: New Consensus Statement on Inpatient Glycemic Control (2013)  Target Ranges:  Prepandial:   less than 140 mg/dL      Peak postprandial:   less than 180 mg/dL (1-2 hours)      Critically ill patients:  140 - 180 mg/dL  Results for ARIA, PICKRELL (MRN [id number]) as of 06/23/2012 13:04  Ref. Range 06/22/2012 11:55 06/22/2012 16:54 06/22/2012 21:15 06/23/2012 08:12 06/23/2012 11:48  Glucose-Capillary Latest Range: 70-99 mg/dL 409 (H) 811 (H) 914 (H) 187 (H) 279 (H)   Inpatient Diabetes Program Recommendations Insulin - Meal Coverage: add Novolog 2 units TID with meals (not given if eats <50%) Thank you  Piedad Climes BSN, RN,CDE Inpatient Diabetes Coordinator [phone] (team pager)

## 2012-06-23 NOTE — Progress Notes (Signed)
[facility] PROGRESS NOTE  Aaron Long ZOX:[id number] DOB: 11-23-1940 DOA: 06/19/2012 PCP: Oneal Grout, MD  Assessment/Plan: right forefoot cellulitis Looks better today.  eventual discharge on home IV and po abx.  As central line has had bleeding around the site all night, will monitor today and repeat h/h in am.  Iv nurse and IR PA has already addressed bleeding   status post recent right great toe amputation   hypertension Continue current regimen of Norvasc, Cardura.  chronic kidney disease stage III Stable.  type 2 diabetes Hemoglobin A1c 7.8. . Increase Levemir to 22 units daily. Sliding scale insulin.  severe iron deficiency anemia Repeat h/h in am  severe peripheral vascular disease Continue aspirin and Plavix.  history of coronary artery disease/hyperlipidemia Stable. Continue aspirin, Norvasc, Plavix, statin.  chronic constipation Continue Colace and MiraLAX.  prophylaxis Heparin sq held for procedure and due to bleeding  Code Status: Full Family Communication: Updated patient wife and daughter at bedside Disposition Plan: Home when medically stable.    Consultants:  Vascular surgery : Dr Darrick Penna 06/19/12  Procedures:  None  Antibiotics:  IV Vancomycin 06/19/12  IV Zosyn 06/19/12  HPI/Subjective: Central line "oozing" all night and today.  Pressure dressing applied. Foot looks better  Objective: Filed Vitals:   06/22/12 2200 06/23/12 0620 06/23/12 1043 06/23/12 1348  BP: 172/66 174/88 162/69 154/69  Pulse: 96 80 79 82  Temp: 101.2 F (38.4 C) 98.4 F (36.9 C) 99.1 F (37.3 C) 99 F (37.2 C)  TempSrc: Oral Oral Oral Oral  Resp: 18 18 18 18   Height:      Weight: 91.8 kg (202 lb 6.1 oz)     SpO2: 95% 98% 93% 95%    Intake/Output Summary (Last 24 hours) at 06/23/12 1357 Last data filed at 06/23/12 1107  Gross per 24 hour  Intake      0 ml  Output    575 ml  Net   -575 ml   Filed Weights   06/20/12 2113 06/21/12 2245  06/22/12 2200  Weight: 91.882 kg (202 lb 9 oz) 90.992 kg (200 lb 9.6 oz) 91.8 kg (202 lb 6.1 oz)    Exam:   General:   NAD  Cardiovascular: RRR  Respiratory: CTAB  Abdomen: Soft/NT/ND/+BS  Extremities: foot less red, less indurated  Data Reviewed: Basic Metabolic Panel:  Recent Labs Lab 06/17/12 2355 06/19/12 0934 06/19/12 1200 06/20/12 0505 06/21/12 0510  NA 134* 137  --  137 137  K 5.6* 4.9  --  4.8 4.9  CL 103 104  --  105 102  CO2 24 23  --  25 24  GLUCOSE 290* 172*  --  241* 286*  BUN 49* 39*  --  39* 41*  CREATININE 2.99* 2.60*  --  2.69* 2.98*  CALCIUM 9.2 8.8  --  9.0 9.0  MG  --   --  1.9  --   --    Liver Function Tests:  Recent Labs Lab 06/17/12 2355  AST 20  ALT 12  ALKPHOS 129*  BILITOT 0.2*  PROT 6.9  ALBUMIN 2.7*   No results found for this basename: LIPASE, AMYLASE,  in the last 168 hours No results found for this basename: AMMONIA,  in the last 168 hours CBC:  Recent Labs Lab 06/17/12 2355 06/19/12 0835 06/20/12 0505 06/21/12 0510 06/22/12 0555  WBC 7.5 10.2 10.0 10.3  --   NEUTROABS 5.1  --   --   --   --  HGB 8.7* 9.4* 8.5* 7.6* 7.8*  HCT 27.0* 28.9* 26.3* 23.4* 24.1*  MCV 89.7 89.2 89.8 89.0  --   PLT 287 301 301 318  --    Cardiac Enzymes: No results found for this basename: CKTOTAL, CKMB, CKMBINDEX, TROPONINI,  in the last 168 hours BNP (last 3 results) No results found for this basename: PROBNP,  in the last 8760 hours CBG:  Recent Labs Lab 06/22/12 1155 06/22/12 1654 06/22/12 2115 06/23/12 0812 06/23/12 1148  GLUCAP 181* 185* 273* 187* 279*    Recent Results (from the past 240 hour(s))  CULTURE, BLOOD (ROUTINE X 2)     Status: None   Collection Time    06/18/12 12:11 AM      Result Value Range Status   Specimen Description BLOOD RIGHT ARM   Final   Special Requests BOTTLES DRAWN AEROBIC AND ANAEROBIC 10CC EACH   Final   Culture  Setup Time 06/18/2012 16:24   Final   Culture     Final   Value:         BLOOD CULTURE RECEIVED NO GROWTH TO DATE CULTURE WILL BE HELD FOR 5 DAYS BEFORE ISSUING A FINAL NEGATIVE REPORT   Report Status PENDING   Incomplete  CULTURE, BLOOD (ROUTINE X 2)     Status: None   Collection Time    06/18/12 12:20 AM      Result Value Range Status   Specimen Description BLOOD HAND RIGHT   Final   Special Requests BOTTLES DRAWN AEROBIC AND ANAEROBIC 10CC EACH   Final   Culture  Setup Time 06/18/2012 16:24   Final   Culture     Final   Value:        BLOOD CULTURE RECEIVED NO GROWTH TO DATE CULTURE WILL BE HELD FOR 5 DAYS BEFORE ISSUING A FINAL NEGATIVE REPORT   Report Status PENDING   Incomplete  URINE CULTURE     Status: None   Collection Time    06/18/12  1:08 AM      Result Value Range Status   Specimen Description URINE, CLEAN CATCH   Final   Special Requests Normal   Final   Culture  Setup Time 06/18/2012 02:17   Final   Colony Count NO GROWTH   Final   Culture NO GROWTH   Final   Report Status 06/19/2012 FINAL   Final  CULTURE, BLOOD (ROUTINE X 2)     Status: None   Collection Time    06/19/12 10:50 AM      Result Value Range Status   Specimen Description BLOOD LEFT ARM   Final   Special Requests BOTTLES DRAWN AEROBIC AND ANAEROBIC 10CC   Final   Culture  Setup Time 06/19/2012 14:12   Final   Culture     Final   Value:        BLOOD CULTURE RECEIVED NO GROWTH TO DATE CULTURE WILL BE HELD FOR 5 DAYS BEFORE ISSUING A FINAL NEGATIVE REPORT   Report Status PENDING   Incomplete  CULTURE, BLOOD (ROUTINE X 2)     Status: None   Collection Time    06/19/12 10:55 AM      Result Value Range Status   Specimen Description BLOOD RIGHT HAND   Final   Special Requests BOTTLES DRAWN AEROBIC ONLY 10CC   Final   Culture  Setup Time 06/19/2012 14:13   Final   Culture     Final   Value:  BLOOD CULTURE RECEIVED NO GROWTH TO DATE CULTURE WILL BE HELD FOR 5 DAYS BEFORE ISSUING A FINAL NEGATIVE REPORT   Report Status PENDING   Incomplete  URINE CULTURE     Status: None    Collection Time    06/19/12 12:52 PM      Result Value Range Status   Specimen Description URINE, RANDOM   Final   Special Requests NONE   Final   Culture  Setup Time 06/19/2012 18:40   Final   Colony Count NO GROWTH   Final   Culture NO GROWTH   Final   Report Status 06/20/2012 FINAL   Final     Studies: Ir Fluoro Guide Cv Line Right  06/22/2012   *RADIOLOGY REPORT*  Indication: Renal insufficiency and foot cellulitis.  The patient needs IV antibiotics.  Request for a tunneled central venous catheter due to renal insufficiency.  PROCEDURE(S): FLUOROSCOPIC AND ULTRASOUND GUIDED PLACEMENT OF A TUNNELED DIAYSIS CATHETER  Physician:  Rachelle Hora. Henn, MD  Medications: None  Moderate sedation time:  None  Fluoroscopy time:  18 seconds.  Procedure:Informed consent was obtained for placement of a tunneled central venous catheter.  The patient was placed supine on the interventional table.  Ultrasound confirmed a patent right internal jugularvein.  Ultrasound images were obtained for documentation. The right side of the neck was prepped and draped in a sterile fashion.  The right side of the neck was anesthetized with 1% lidocaine.  Maximal barrier sterile technique was utilized including caps, mask, sterile gowns, sterile gloves, sterile drape, hand hygiene and skin antiseptic.  A small incision was made with #11 blade scalpel.  A 21 gauge needle directed into the right internal jugular vein with ultrasound guidance. A wire and dilator were advanced centrally.  A single lumen Powerline catheter was selected.  The skin below the right clavicle was anesthetized and a small incision was made with an #11 blade scalpel.  A subcutaneous tunnel was formed to the vein dermatotomy site.  The catheter was brought through the tunnel. The cuff was placed underneath the skin.  The vein dermatotomy site was dilated to accommodate a peel- away sheath.  The catheter was placed through the peel-away sheath and directed into the  central venous structures.  The tip of the catheter was placed near the junction of the lower SVC and right atrium with fluoroscopy.  Fluoroscopic images were obtained for documentation.  The lumen was found to aspirate and flush well. The vein dermatotomy site was closed using a single layer of absorbable suture and Dermabond.  The catheter was secured to the skin using Prolene suture.  Findings:Catheter tip is near the junction of the lower SVC and right atrium.  Complications: None  Impression:Successful placement of a tunneled central venous catheter using ultrasound and fluoroscopic guidance.   Original Report Authenticated By: Richarda Overlie, M.D.   Ir US Guide Vasc Access Right  06/22/2012   *RADIOLOGY REPORT*  Indication: Renal insufficiency and foot cellulitis.  The patient needs IV antibiotics.  Request for a tunneled central venous catheter due to renal insufficiency.  PROCEDURE(S): FLUOROSCOPIC AND ULTRASOUND GUIDED PLACEMENT OF A TUNNELED DIAYSIS CATHETER  Physician:  Rachelle Hora. Henn, MD  Medications: None  Moderate sedation time:  None  Fluoroscopy time:  18 seconds.  Procedure:Informed consent was obtained for placement of a tunneled central venous catheter.  The patient was placed supine on the interventional table.  Ultrasound confirmed a patent right internal jugularvein.  Ultrasound images were obtained  for documentation. The right side of the neck was prepped and draped in a sterile fashion.  The right side of the neck was anesthetized with 1% lidocaine.  Maximal barrier sterile technique was utilized including caps, mask, sterile gowns, sterile gloves, sterile drape, hand hygiene and skin antiseptic.  A small incision was made with #11 blade scalpel.  A 21 gauge needle directed into the right internal jugular vein with ultrasound guidance. A wire and dilator were advanced centrally.  A single lumen Powerline catheter was selected.  The skin below the right clavicle was anesthetized and a small  incision was made with an #11 blade scalpel.  A subcutaneous tunnel was formed to the vein dermatotomy site.  The catheter was brought through the tunnel. The cuff was placed underneath the skin.  The vein dermatotomy site was dilated to accommodate a peel- away sheath.  The catheter was placed through the peel-away sheath and directed into the central venous structures.  The tip of the catheter was placed near the junction of the lower SVC and right atrium with fluoroscopy.  Fluoroscopic images were obtained for documentation.  The lumen was found to aspirate and flush well. The vein dermatotomy site was closed using a single layer of absorbable suture and Dermabond.  The catheter was secured to the skin using Prolene suture.  Findings:Catheter tip is near the junction of the lower SVC and right atrium.  Complications: None  Impression:Successful placement of a tunneled central venous catheter using ultrasound and fluoroscopic guidance.   Original Report Authenticated By: Richarda Overlie, M.D.    Scheduled Meds: . amLODipine  5 mg Oral Daily  . aspirin EC  81 mg Oral Daily  . atorvastatin  20 mg Oral q1800  . clopidogrel  75 mg Oral Q breakfast  . docusate sodium  100 mg Oral BID  . doxazosin  0.5 mg Oral QHS  . gelatin adsorbable      . insulin aspart  0-9 Units Subcutaneous TID WC  . insulin detemir  20 Units Subcutaneous QHS  . piperacillin-tazobactam (ZOSYN)  IV  3.375 g Intravenous Q8H  . polyethylene glycol  17 g Oral BID  . pregabalin  200 mg Oral BID  . sodium chloride  3 mL Intravenous Q12H  . sodium chloride  3 mL Intravenous Q12H  . vancomycin  1,000 mg Intravenous Q24H   Continuous Infusions:   Time spent: > 25 mins  Macel Yearsley L  [facility] Pager [phone]. If 7PM-7AM, please contact night-coverage at [email or web address], password [facility] 06/23/2012, 1:57 PM  LOS: 4 days

## 2012-06-23 NOTE — Progress Notes (Signed)
Patient ID: Aaron Long, male   DOB: 07-03-1940, 72 y.o.   MRN: [id number]   Rt IJ central venous catheter was placed 6/12 Continues to ooze from site  Sat pt upright in bed Held pressure x 8 minutes Placed new Gel foam sterile pressure dressing  Will recheck in am

## 2012-06-23 NOTE — Progress Notes (Signed)
PT Cancellation Note  Patient Details Name: Aaron Long MRN: [id number] DOB: 29-Mar-1940   Cancelled Treatment:    Reason Eval/Treat Not Completed: Medical issues which prohibited therapy  Pt with seeping from port site this am, and wanted to continue resting this pm.   Donnetta Hail 06/23/2012, 3:40 PM

## 2012-06-23 NOTE — Progress Notes (Signed)
Assessed right IJ tunneled catheter-continues to ooze from site- dressing removed, oozing from insertion site,pressure to site just above insertion area seemed to slow oozing so gauze pressure to that area and new occlusive dressing over site. RN aware

## 2012-06-23 NOTE — Progress Notes (Signed)
Pt had tunneled central line placed Thursday 06/22/12,pt had oozing from exit site,Vicki from IV changed dressing times one on her shift.Pt continued to ooz blood from central line on night shift.Dorene Sorrow from IV Team changed dsg times one.Central line still oozing blood into Friday morning.DR Marcha Dutton notified,interventional radiology notified,spoke with Pam the PA, notified,pt shows no signs of distress,vss, will cont to monitor Artemio Aly RN.

## 2012-06-23 NOTE — Progress Notes (Signed)
ANTIBIOTIC CONSULT NOTE-PROGRESS NOTE  Pharmacy Consult for : Vancomycin  Indication: R-foot cellulitis post-op amputation secondary to gangrene  Hospital Problems Principal Problem:   Fever and chills Active Problems:   HTN (hypertension)   CAD (coronary artery disease)   PAD (peripheral artery disease)   CKD (chronic kidney disease) stage 3, GFR 30-59 ml/min   S/P BKA (below knee amputation)   Diabetic neuropathy, type II diabetes mellitus   Cellulitis of foot, right   Anemia  Vitals: BP 154/69  Pulse 82  Temp(Src) 99 F (37.2 C) (Oral)  Resp 18  Ht 5' 8.11" (1.73 m)  Wt 202 lb 6.1 oz (91.8 kg)  BMI 30.67 kg/m2  SpO2 95%  Labs:  Recent Labs  06/21/12 0510 06/22/12 0555  WBC 10.3  --   HGB 7.6* 7.8*  PLT 318  --   CREATININE 2.98*  --    Estimated Creatinine Clearance: 24.7 ml/min (by C-G formula based on Cr of 2.98).  Vancomycin trough = 19.9 mcg/ml   Microbiology: Recent Results (from the past 720 hour(s))  SURGICAL PCR SCREEN     Status: None   Collection Time    06/09/12  4:11 PM      Result Value Range Status   MRSA, PCR NEGATIVE  NEGATIVE Final   Staphylococcus aureus NEGATIVE  NEGATIVE Final   Comment:            The Xpert SA Assay (FDA     approved for NASAL specimens     in patients over 33 years of age),     is one component of     a comprehensive surveillance     program.  Test performance has     been validated by [employer] for patients greater     than or equal to 30 year old.     It is not intended     to diagnose infection nor to     guide or monitor treatment.  CULTURE, BLOOD (ROUTINE X 2)     Status: None   Collection Time    06/18/12 12:11 AM      Result Value Range Status   Specimen Description BLOOD RIGHT ARM   Final   Special Requests BOTTLES DRAWN AEROBIC AND ANAEROBIC 10CC EACH   Final   Culture  Setup Time 06/18/2012 16:24   Final   Culture     Final   Value:        BLOOD CULTURE RECEIVED NO GROWTH TO DATE CULTURE  WILL BE HELD FOR 5 DAYS BEFORE ISSUING A FINAL NEGATIVE REPORT   Report Status PENDING   Incomplete  CULTURE, BLOOD (ROUTINE X 2)     Status: None   Collection Time    06/18/12 12:20 AM      Result Value Range Status   Specimen Description BLOOD HAND RIGHT   Final   Special Requests BOTTLES DRAWN AEROBIC AND ANAEROBIC 10CC EACH   Final   Culture  Setup Time 06/18/2012 16:24   Final   Culture     Final   Value:        BLOOD CULTURE RECEIVED NO GROWTH TO DATE CULTURE WILL BE HELD FOR 5 DAYS BEFORE ISSUING A FINAL NEGATIVE REPORT   Report Status PENDING   Incomplete  URINE CULTURE     Status: None   Collection Time    06/18/12  1:08 AM      Result Value Range Status   Specimen  Description URINE, CLEAN CATCH   Final   Special Requests Normal   Final   Culture  Setup Time 06/18/2012 02:17   Final   Colony Count NO GROWTH   Final   Culture NO GROWTH   Final   Report Status 06/19/2012 FINAL   Final  CULTURE, BLOOD (ROUTINE X 2)     Status: None   Collection Time    06/19/12 10:50 AM      Result Value Range Status   Specimen Description BLOOD LEFT ARM   Final   Special Requests BOTTLES DRAWN AEROBIC AND ANAEROBIC 10CC   Final   Culture  Setup Time 06/19/2012 14:12   Final   Culture     Final   Value:        BLOOD CULTURE RECEIVED NO GROWTH TO DATE CULTURE WILL BE HELD FOR 5 DAYS BEFORE ISSUING A FINAL NEGATIVE REPORT   Report Status PENDING   Incomplete  CULTURE, BLOOD (ROUTINE X 2)     Status: None   Collection Time    06/19/12 10:55 AM      Result Value Range Status   Specimen Description BLOOD RIGHT HAND   Final   Special Requests BOTTLES DRAWN AEROBIC ONLY 10CC   Final   Culture  Setup Time 06/19/2012 14:13   Final   Culture     Final   Value:        BLOOD CULTURE RECEIVED NO GROWTH TO DATE CULTURE WILL BE HELD FOR 5 DAYS BEFORE ISSUING A FINAL NEGATIVE REPORT   Report Status PENDING   Incomplete  URINE CULTURE     Status: None   Collection Time    06/19/12 12:52 PM       Result Value Range Status   Specimen Description URINE, RANDOM   Final   Special Requests NONE   Final   Culture  Setup Time 06/19/2012 18:40   Final   Colony Count NO GROWTH   Final   Culture NO GROWTH   Final   Report Status 06/20/2012 FINAL   Final    Anti-infectives Anti-infectives   Start     Dose/Rate Route Frequency Ordered Stop   06/23/12 0000  sodium chloride 0.9 % SOLN 250 mL with vancomycin 1000 MG SOLR 1,000 mg     1,000 mg 250 mL/hr over 60 Minutes Intravenous Every 24 hours 06/23/12 0927     06/23/12 0000  piperacillin-tazobactam (ZOSYN) 3.375 GM/50ML IVPB  Status:  Discontinued     3.375 g Intravenous Every 8 hours 06/23/12 0927 06/23/12    06/23/12 0000  piperacillin-tazobactam (ZOSYN) 3.375 GM/50ML IVPB     3.375 g Intravenous Every 8 hours 06/23/12 0930 07/28/12 2359   06/20/12 1200  vancomycin (VANCOCIN) IVPB 1000 mg/200 mL premix     1,000 mg 200 mL/hr over 60 Minutes Intravenous Every 24 hours 06/19/12 1125     06/19/12 1800  piperacillin-tazobactam (ZOSYN) IVPB 3.375 g     3.375 g 12.5 mL/hr over 240 Minutes Intravenous 3 times per day 06/19/12 1614     06/19/12 1230  vancomycin (VANCOCIN) 2,000 mg in sodium chloride 0.9 % 500 mL IVPB     2,000 mg 250 mL/hr over 120 Minutes Intravenous  Once 06/19/12 1125 06/19/12 1502     Assessment:  Day # 5/42 Vancomycin and Zosyn for post-op cellulitis of R-foot following amputation of gangrenous R-Great Toe.  Vancomycin trough 19.9 mcg/ml [top of clinical goal] Renal function stable.  Will reduce Vancomycin to reduce  possible renal toxicity while maintaining therapeutic Vancomycin levels.  Goal of Therapy:   Vancomycin trough level 15-20 mcg/ml  Plan:   Reduce Vancomycin to 750 mg IV q 24 hours.  Continue Zosyn as previously ordered.  When discharged home, recommend weekly Vancomycin troughs.  Laurena Bering, Pharm.D.  06/23/2012 2:01 PM

## 2012-06-23 NOTE — Care Management Note (Addendum)
06/23/2012 Met with pt yesterday, 06/22/12 re HH and IV antibiotics, explained that pt and family member will be taught to administer, pt stated understanding and specifically stated that his wife would be able to assist. Noted per vascular PA that pt wife has difficult seeing. [facility] notified and will contact pt wife to clarify her ability to assist this pt at home with IV antibiotics. Pt must have dependable assist from family/friend to receive antibiotics at home. HH can not provide HHRN to adm ongoing IV antibiotics. Await response from [facility] re pt wife ability to assist.  Johny Shock RN MPH, case manager, [phone] Spoke with pt wife re HH needs, she is now willing to assist with IV administration, [facility] to contact her and explain needs in detail.  Johny Shock RN MPH

## 2012-06-23 NOTE — Progress Notes (Signed)
OT Cancellation Note  Patient Details Name: Aaron Long MRN: [id number] DOB: 04/14/1940   Cancelled Treatment:    Reason Eval/Treat Not Completed: Medical issues which prohibited therapy--pt had Rt IJ central venous catheter was placed yesterday and blood is still oozing from site. Pt says he is worn out/not feeling good and I feel it is probably not a good idea to use his RUE a lot today only to potentially aggravate the site in his pectoral region. Will seen next week.   Evette Georges [phone] 06/23/2012, 3:24 PM

## 2012-06-24 DIAGNOSIS — E1351 Other specified diabetes mellitus with diabetic peripheral angiopathy without gangrene: Secondary | ICD-10-CM

## 2012-06-24 DIAGNOSIS — I739 Peripheral vascular disease, unspecified: Secondary | ICD-10-CM

## 2012-06-24 DIAGNOSIS — I96 Gangrene, not elsewhere classified: Secondary | ICD-10-CM

## 2012-06-24 DIAGNOSIS — N184 Chronic kidney disease, stage 4 (severe): Secondary | ICD-10-CM

## 2012-06-24 LAB — CBC
Hemoglobin: 8.1 g/dL — ABNORMAL LOW (ref 13.0–17.0)
MCHC: 32.1 g/dL (ref 30.0–36.0)
RDW: 14.2 % (ref 11.5–15.5)
WBC: 15.3 10*3/uL — ABNORMAL HIGH (ref 4.0–10.5)

## 2012-06-24 LAB — BASIC METABOLIC PANEL
BUN: 33 mg/dL — ABNORMAL HIGH (ref 6–23)
Creatinine, Ser: 2.98 mg/dL — ABNORMAL HIGH (ref 0.50–1.35)
GFR calc Af Amer: 23 mL/min — ABNORMAL LOW (ref >90)
GFR calc non Af Amer: 20 mL/min — ABNORMAL LOW (ref >90)
Potassium: 4.4 mEq/L (ref 3.5–5.1)

## 2012-06-24 LAB — GLUCOSE, CAPILLARY
Glucose-Capillary: 231 mg/dL — ABNORMAL HIGH (ref 70–99)
Glucose-Capillary: 239 mg/dL — ABNORMAL HIGH (ref 70–99)
Glucose-Capillary: 220 mg/dL — ABNORMAL HIGH (ref 70–99)

## 2012-06-24 LAB — CULTURE, BLOOD (ROUTINE X 2): Culture: NO GROWTH

## 2012-06-24 IMAGING — CR DG CHEST 1V PORT
1 series · 1 of 1 positions shown · non-contrast
Comparison: Two-view chest 05/05/2011.

CLINICAL DATA: Fever.

PORTABLE CHEST - 1 VIEW

[AP]
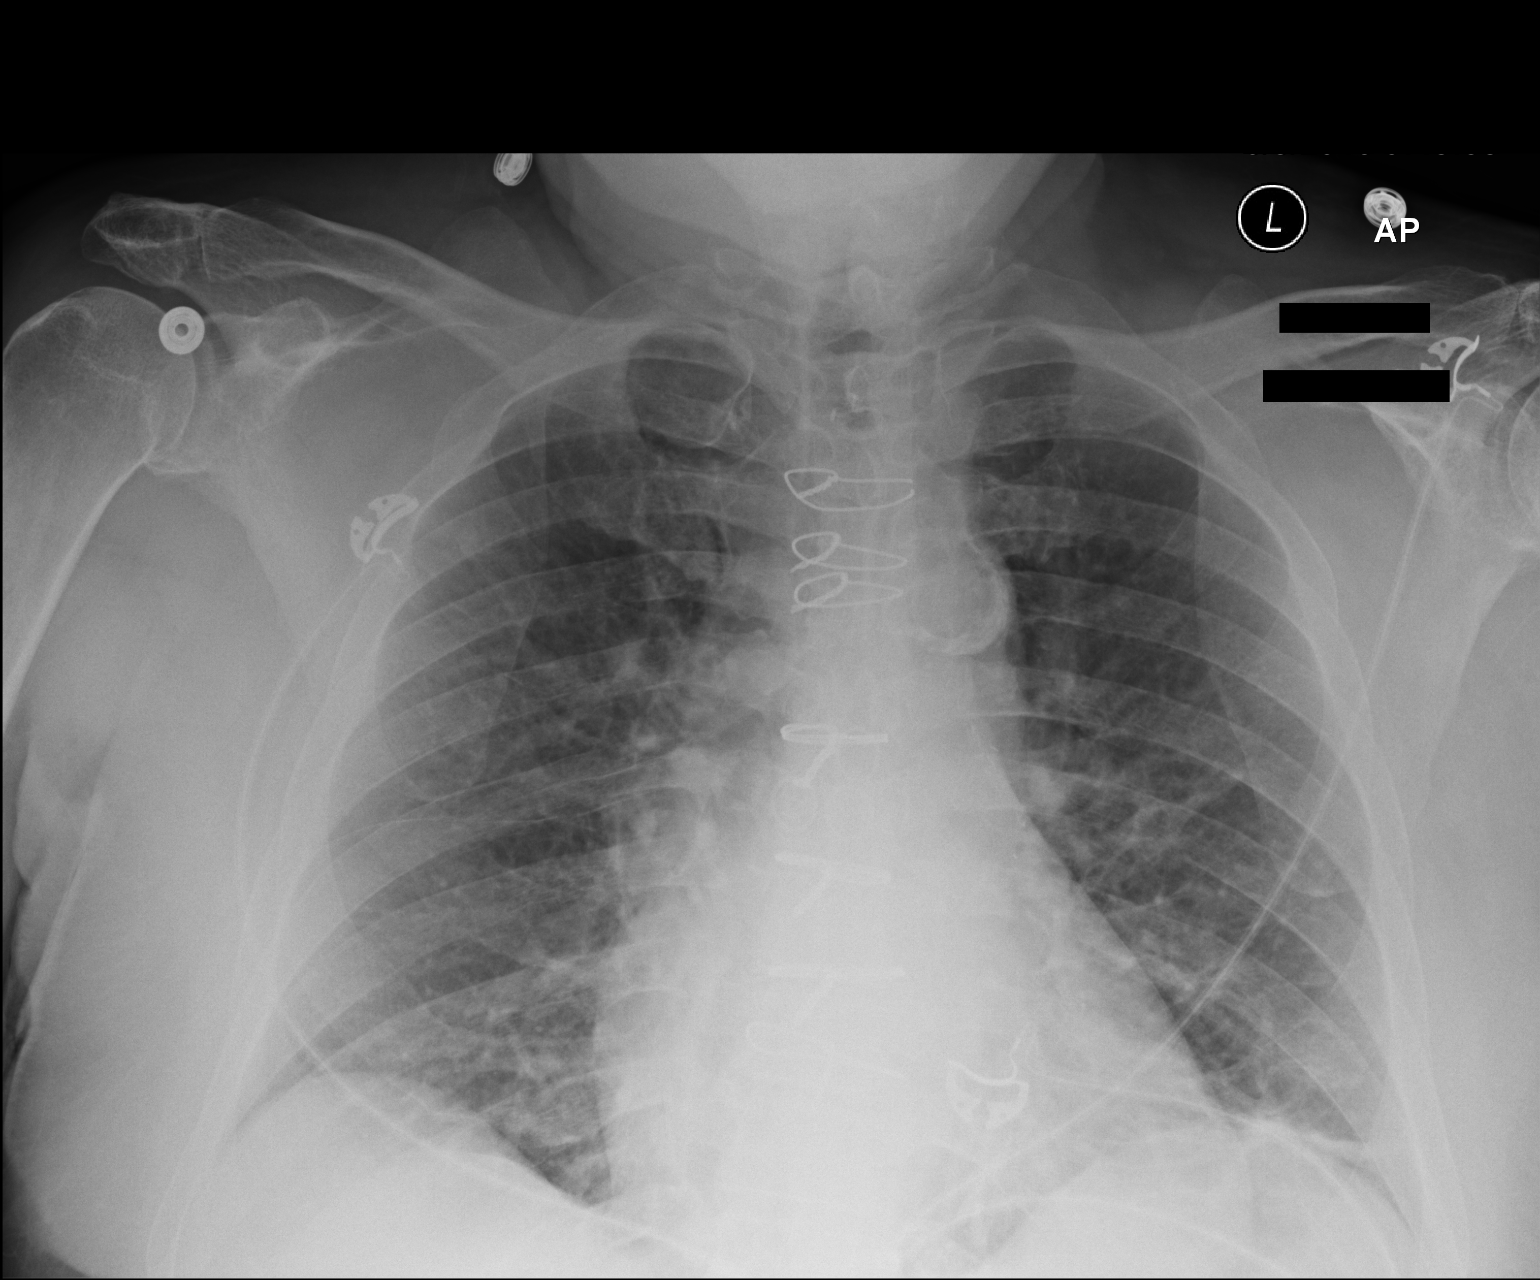

[1 of 1 positions shown; findings below may reference images not displayed]

FINDINGS: The heart size is upper limits of normal.  The lung
volumes are low.  Minimal left basilar airspace disease likely
reflects atelectasis.  No other consolidation is evident.  The
patient is status post median sternotomy for CABG.
IMPRESSION: 1.  Low lung volumes and minimal left basilar airspace disease,
likely reflecting atelectasis.
2.  Borderline cardiomegaly without failure.

## 2012-06-24 MED ORDER — VANCOMYCIN HCL 1000 MG IV SOLR: 1000.0000 mg | INTRAVENOUS | Status: DC

## 2012-06-24 MED ORDER — ACETAMINOPHEN 325 MG PO TABS: 650.0000 mg | ORAL_TABLET | Freq: Four times a day (QID) | ORAL | Status: DC | PRN

## 2012-06-24 MED ORDER — CIPROFLOXACIN HCL 500 MG PO TABS: 500.0000 mg | ORAL_TABLET | Freq: Every day | ORAL | Status: DC

## 2012-06-24 MED ORDER — INSULIN DETEMIR 100 UNIT/ML ~~LOC~~ SOLN: 22.0000 [IU] | Freq: Every day | SUBCUTANEOUS | Status: DC

## 2012-06-24 NOTE — Progress Notes (Addendum)
Physical Therapy Treatment Patient Details Name: Aaron Long MRN: [id number] DOB: 05/29/1940 Today's Date: 06/24/2012 Time: [phone] (with 19 minute interruption) PT Time Calculation (min): 43 min - 19 min interruption = total 24 min  PT Assessment / Plan / Recommendation Comments on Treatment Session  Patient able to don LLE prosthesis independently.  Continues to have drainage from PICC - minimized use of RUE today.  Patient continues to improve with mobility.    Follow Up Recommendations  Home health PT;Supervision for mobility/OOB     Does the patient have the potential to tolerate intense rehabilitation     Barriers to Discharge        Equipment Recommendations  Other (comment) (3-in-1 drop-arm BSC)    Recommendations for Other Services    Frequency Min 3X/week   Plan Discharge plan remains appropriate;Frequency remains appropriate    Precautions / Restrictions Precautions Precautions: Fall Required Braces or Orthoses: Other Brace/Splint Other Brace/Splint: Prosthesis Lt. LE, specialized boot for rt foot. Restrictions Weight Bearing Restrictions: Yes RLE Weight Bearing: Weight bearing as tolerated   Pertinent Vitals/Pain     Mobility  Bed Mobility Bed Mobility: Rolling Left;Left Sidelying to Sit;Sitting - Scoot to Edge of Bed Rolling Left: 4: Min guard;With rail Left Sidelying to Sit: 4: Min assist;With rails;HOB elevated Sitting - Scoot to Edge of Bed: 4: Min assist;With rail Details for Bed Mobility Assistance: Verbal cues for technique.  Increased time for mobility Transfers Transfers: Sit to Stand;Stand to Sit;Stand Pivot Transfers Sit to Stand: 1: +2 Total assist;With upper extremity assist;From bed Sit to Stand: Patient Percentage: 80% Stand to Sit: 3: Mod assist;With upper extremity assist;With armrests;To chair/3-in-1 Stand Pivot Transfers: 1: +2 Total assist Stand Pivot Transfers: Patient Percentage: 70% Details for Transfer Assistance: Verbal  cues for hand placement and safety. Ambulation/Gait Ambulation/Gait Assistance: Not tested (comment)    Exercises Other Exercises Other Exercises: armchair pushups x 10 with good height of elevation    PT Goals Acute Rehab PT Goals Pt will go Supine/Side to Sit: with supervision PT Goal: Supine/Side to Sit - Progress: Progressing toward goal Pt will go Sit to Stand: with min assist PT Goal: Sit to Stand - Progress: Progressing toward goal Pt will go Stand to Sit: with min assist PT Goal: Stand to Sit - Progress: Progressing toward goal Pt will Transfer Bed to Chair/Chair to Bed: with supervision PT Transfer Goal: Bed to Chair/Chair to Bed - Progress: Progressing toward goal  Visit Information  Last PT Received On: 06/24/12 Assistance Needed: +2    Subjective Data  Subjective: Patient reports swelling has decreased in stump   Cognition  Cognition Arousal/Alertness: Awake/alert Behavior During Therapy: WFL for tasks assessed/performed Overall Cognitive Status: Within Functional Limits for tasks assessed    Balance     End of Session PT - End of Session Equipment Utilized During Treatment: Gait belt Activity Tolerance: Patient limited by fatigue Patient left: in chair;with call bell/phone within reach;with nursing in room Nurse Communication: Mobility status   GP     Vena Austria 06/24/2012, 10:12 AM Durenda Hurt. Renaldo Fiddler, [facility] Acute Rehab Services Pager [phone]

## 2012-06-24 NOTE — Progress Notes (Signed)
[facility] PROGRESS NOTE  DUGLAS HEIER UJW:[id number] DOB: 1940/05/29 DOA: 06/19/2012 PCP: Oneal Grout, MD  Discharged, then notified by cm that patient does not have part  D and cannot afford private pay for vanc! Discharge cancelled and plans for SNF  Assessment/Plan: right forefoot cellulitis To SNF  Bleeding at CVL site has stopped and hgb stable. Ready for discharge when SNF (today is Saturday)   status post recent right great toe amputation   hypertension Continue current regimen of Norvasc, Cardura.  chronic kidney disease stage III Stable.  type 2 diabetes Hemoglobin A1c 7.8. . Increase Levemir to 22 units daily. Sliding scale insulin.  severe iron deficiency anemia Repeat h/h in am  severe peripheral vascular disease Continue aspirin and Plavix.  history of coronary artery disease/hyperlipidemia Stable. Continue aspirin, Norvasc, Plavix, statin.  chronic constipation Continue Colace and MiraLAX.  prophylaxis Heparin sq held for procedure and due to bleeding  Code Status: Full Family Communication: Updated patient wife and daughter at bedside Disposition Plan: Home when medically stable.    Consultants:  Vascular surgery : Dr Darrick Penna 06/19/12  Procedures:  None  Antibiotics:  IV Vancomycin 06/19/12  IV Zosyn 06/19/12  HPI/Subjective: No complaints  Objective: Filed Vitals:   06/24/12 0459 06/24/12 0909 06/24/12 1317 06/24/12 1559  BP: 159/63 125/52 144/55 150/73  Pulse: 81 73 79 87  Temp: 99.2 F (37.3 C) 98.9 F (37.2 C) 98.8 F (37.1 C) 99.1 F (37.3 C)  TempSrc: Oral     Resp: 18 18 17 20   Height:      Weight:      SpO2: 97% 96% 97% 99%    Intake/Output Summary (Last 24 hours) at 06/24/12 1930 Last data filed at 06/24/12 1900  Gross per 24 hour  Intake    560 ml  Output   1225 ml  Net   -665 ml   Filed Weights   06/21/12 2245 06/22/12 2200 06/23/12 2200  Weight: 90.992 kg (200 lb 9.6 oz) 91.8 kg (202 lb 6.1 oz)  92.1 kg (203 lb 0.7 oz)    Exam:   General:   NAD  Catheter site with clean dressing  Cardiovascular: RRR  Respiratory: CTAB  Abdomen: Soft/NT/ND/+BS  Extremities: foot less red, less indurated  Data Reviewed: Basic Metabolic Panel:  Recent Labs Lab 06/17/12 2355 06/19/12 0934 06/19/12 1200 06/20/12 0505 06/21/12 0510 06/24/12 0415  NA 134* 137  --  137 137 136  K 5.6* 4.9  --  4.8 4.9 4.4  CL 103 104  --  105 102 101  CO2 24 23  --  25 24 25   GLUCOSE 290* 172*  --  241* 286* 193*  BUN 49* 39*  --  39* 41* 33*  CREATININE 2.99* 2.60*  --  2.69* 2.98* 2.98*  CALCIUM 9.2 8.8  --  9.0 9.0 9.0  MG  --   --  1.9  --   --   --    Liver Function Tests:  Recent Labs Lab 06/17/12 2355  AST 20  ALT 12  ALKPHOS 129*  BILITOT 0.2*  PROT 6.9  ALBUMIN 2.7*   No results found for this basename: LIPASE, AMYLASE,  in the last 168 hours No results found for this basename: AMMONIA,  in the last 168 hours CBC:  Recent Labs Lab 06/17/12 2355 06/19/12 0835 06/20/12 0505 06/21/12 0510 06/22/12 0555 06/24/12 0415  WBC 7.5 10.2 10.0 10.3  --  15.3*  NEUTROABS 5.1  --   --   --   --   --  HGB 8.7* 9.4* 8.5* 7.6* 7.8* 8.1*  HCT 27.0* 28.9* 26.3* 23.4* 24.1* 25.2*  MCV 89.7 89.2 89.8 89.0  --  88.4  PLT 287 301 301 318  --  409*   Cardiac Enzymes: No results found for this basename: CKTOTAL, CKMB, CKMBINDEX, TROPONINI,  in the last 168 hours BNP (last 3 results) No results found for this basename: PROBNP,  in the last 8760 hours CBG:  Recent Labs Lab 06/23/12 1652 06/23/12 2210 06/24/12 0706 06/24/12 1127 06/24/12 1557  GLUCAP 234* 280* 148* 231* 239*    Recent Results (from the past 240 hour(s))  CULTURE, BLOOD (ROUTINE X 2)     Status: None   Collection Time    06/18/12 12:11 AM      Result Value Range Status   Specimen Description BLOOD RIGHT ARM   Final   Special Requests BOTTLES DRAWN AEROBIC AND ANAEROBIC 10CC EACH   Final   Culture  Setup Time  06/18/2012 16:24   Final   Culture NO GROWTH 5 DAYS   Final   Report Status 06/24/2012 FINAL   Final  CULTURE, BLOOD (ROUTINE X 2)     Status: None   Collection Time    06/18/12 12:20 AM      Result Value Range Status   Specimen Description BLOOD HAND RIGHT   Final   Special Requests BOTTLES DRAWN AEROBIC AND ANAEROBIC 10CC EACH   Final   Culture  Setup Time 06/18/2012 16:24   Final   Culture NO GROWTH 5 DAYS   Final   Report Status 06/24/2012 FINAL   Final  URINE CULTURE     Status: None   Collection Time    06/18/12  1:08 AM      Result Value Range Status   Specimen Description URINE, CLEAN CATCH   Final   Special Requests Normal   Final   Culture  Setup Time 06/18/2012 02:17   Final   Colony Count NO GROWTH   Final   Culture NO GROWTH   Final   Report Status 06/19/2012 FINAL   Final  CULTURE, BLOOD (ROUTINE X 2)     Status: None   Collection Time    06/19/12 10:50 AM      Result Value Range Status   Specimen Description BLOOD LEFT ARM   Final   Special Requests BOTTLES DRAWN AEROBIC AND ANAEROBIC 10CC   Final   Culture  Setup Time 06/19/2012 14:12   Final   Culture     Final   Value:        BLOOD CULTURE RECEIVED NO GROWTH TO DATE CULTURE WILL BE HELD FOR 5 DAYS BEFORE ISSUING A FINAL NEGATIVE REPORT   Report Status PENDING   Incomplete  CULTURE, BLOOD (ROUTINE X 2)     Status: None   Collection Time    06/19/12 10:55 AM      Result Value Range Status   Specimen Description BLOOD RIGHT HAND   Final   Special Requests BOTTLES DRAWN AEROBIC ONLY 10CC   Final   Culture  Setup Time 06/19/2012 14:13   Final   Culture     Final   Value:        BLOOD CULTURE RECEIVED NO GROWTH TO DATE CULTURE WILL BE HELD FOR 5 DAYS BEFORE ISSUING A FINAL NEGATIVE REPORT   Report Status PENDING   Incomplete  URINE CULTURE     Status: None   Collection Time    06/19/12 12:52 PM  Result Value Range Status   Specimen Description URINE, RANDOM   Final   Special Requests NONE   Final    Culture  Setup Time 06/19/2012 18:40   Final   Colony Count NO GROWTH   Final   Culture NO GROWTH   Final   Report Status 06/20/2012 FINAL   Final     Studies: No results found.  Scheduled Meds: . amLODipine  5 mg Oral Daily  . aspirin EC  81 mg Oral Daily  . atorvastatin  20 mg Oral q1800  . clopidogrel  75 mg Oral Q breakfast  . docusate sodium  100 mg Oral BID  . doxazosin  0.5 mg Oral QHS  . insulin aspart  0-9 Units Subcutaneous TID WC  . insulin detemir  22 Units Subcutaneous QHS  . piperacillin-tazobactam (ZOSYN)  IV  3.375 g Intravenous Q8H  . polyethylene glycol  17 g Oral BID  . pregabalin  200 mg Oral BID  . sodium chloride  3 mL Intravenous Q12H  . sodium chloride  3 mL Intravenous Q12H  . vancomycin (VANCOCIN) 750 mg IVPB  750 mg Intravenous Q24H   Continuous Infusions:   Time spent: 35 mins  Alona Danford L  [facility] Pager [phone]. If 7PM-7AM, please contact night-coverage at [email or web address], password [facility] 06/24/2012, 7:30 PM  LOS: 5 days

## 2012-06-24 NOTE — Progress Notes (Signed)
Rt IJ PICC site reassessed. Dressing removed. Clot around PICC hub and sutures, but no active bleeding. Clot removed and new clean dressing applied. Call IR if further issues.  Brayton El PA-C Interventional Radiology 06/24/2012 9:41 AM

## 2012-06-24 NOTE — Care Management (Signed)
   CARE MANAGEMENT NOTE 06/24/2012  Patient:  Aaron Long, Aaron Long   Account Number:  [id number]  Date Initiated:  06/22/2012  Documentation initiated by:  Aaron Long,Aaron Long  Subjective/Objective Assessment:   Noted pllan to d/c pt with central line and IV antibiotic therapy.     Action/Plan:   06/22/12 Met with pt who is active with [facility], he states that his wife will be able to assist with IV antibiotic administration. [facility] notified of plan and will provide services, antibiotics and supplies.   Anticipated DC Date:  06/26/2012   Anticipated DC Plan:  SKILLED NURSING FACILITY  In-house referral  Clinical Social Worker      DC Associate Professor  CM consult      [facility] Choice  HOME HEALTH   Choice offered to / List presented to:  C-1 Patient   DME arranged  IV PUMP/EQUIPMENT      DME agency  [facility]     [facility] arranged  HH-1 RN  HH-2 PT  HH-3 OT      [facility] agency  [facility]   Status of service:  In process, will continue to follow Medicare Important Message given?   (If response is "NO", the following Medicare IM given date fields will be blank) Date Medicare IM given:   Date Additional Medicare IM given:    Discharge Disposition:  HOME W HOME HEALTH SERVICES  Per UR Regulation:    If discussed at Long Length of Stay Meetings, dates discussed:    Comments:  06/24/12 [facility] [phone]  Received referral for patient discharging home today with IV antibiotics. [facility] has been following patient for IV antibiotics at home. However, patient does not have IV antibiotic prescription coverage. Spoke with wife and patient who indicate that they can not afford the copay amount that was quoted to them on yesterday 06/23/12. Mrs Stallbaumer states she mentions yesterday that they will not be able to afford 174.18 per week for IV Vancomycin. Made Dr Lendell Caprice aware of the situation and Dr Lendell Caprice suggested SNF if patient unable to go home with IV antibiotic.  Spoke with patient and wife who both are agreeable to SNF placement. Unfortunately placement will likely not be this weekend.Made inpatient LCSW aware of the referral. Of note, spoke with [facility] pharmacy department regarding costs and possible medication assistance [place]). Informed that patient could possibly be charged around 58 dollars a week for the IV Vancomycin. Went back to bedside to offer this option to Mr and Mrs Fuchs and Mrs Marciano admantly states they cannot afford to pay the copay amount. Mr Coker states he wants to now go to SNF and Mrs Thunder states she has issues with her eyesight that would have made it very difficult to administer the IV abx to begin with. Mrs Collums's cell number is [phone] and she states she would like to be called regarding SNF if she is not in the room. Text paged MD to make aware dc plans.[email or web address] [phone]    06/22/2012 Met with pt who is active with [facility] for [facility] services and wishes to continue with that agency. [facility] notified of plan to d/c to home with [facility], PT, OT and IV antibiotics. Await prescription. [facility] services will begin the day of d/c  to home. Johny Shock RN MPH Case Manager [phone]

## 2012-06-25 DIAGNOSIS — I70269 Atherosclerosis of native arteries of extremities with gangrene, unspecified extremity: Secondary | ICD-10-CM

## 2012-06-25 LAB — CULTURE, BLOOD (ROUTINE X 2): Culture: NO GROWTH

## 2012-06-25 LAB — GLUCOSE, CAPILLARY: Glucose-Capillary: 210 mg/dL — ABNORMAL HIGH (ref 70–99)

## 2012-06-25 MED ORDER — INSULIN DETEMIR 100 UNIT/ML ~~LOC~~ SOLN
24.0000 [IU] | Freq: Every day | SUBCUTANEOUS | Status: DC
  Administered 2012-06-25: 24 [IU] via SUBCUTANEOUS
  Filled 2012-06-25 (×2): qty 0.24

## 2012-06-25 NOTE — Clinical Social Work Note (Signed)
Clinical Social Work Department BRIEF PSYCHOSOCIAL ASSESSMENT 06/25/2012  Patient:  Aaron Long, Aaron Long     Account Number:  [id number]     Admit date:  06/19/2012  Clinical Social Worker:  Verl Blalock  Date/Time:  06/25/2012 09:30 AM  Referred by:  Care Management  Date Referred:  06/25/2012 Referred for  SNF Placement   Other Referral:   Can't afford IV antibiotics at home   Interview type:  Patient Other interview type:   Patient permission to speak with patient wife over the phone    PSYCHOSOCIAL DATA Living Status:  WIFE Admitted from facility:   Level of care:   Primary support name:  [name], [name]  [phone] Primary support relationship to patient:  SPOUSE Degree of support available:   Strong    CURRENT CONCERNS Current Concerns  Post-Acute Placement   Other Concerns:    SOCIAL WORK ASSESSMENT / PLAN Clinical Social Worker met with patient at bedside to offer support and discuss patient plans at discharge.  Patient states that he lives at home with his wife and was planning to return home with home health, however due to lack of medication coverage on his insurance for IV antibiotics, patient and wife have opted for SNF placement.  Patient is agreeable at bedside and asked CSW to contact his wife regarding bed offers and preference.  CSW spoke with patient wife over the phone who is requesting [facility].  CSW to initiate search in [facility] and follow up with patient wife regarding possible bed offers. CSW will contact facility liaison regarding patient family preference.  CSW remains available for support and to facilitate patient discharge needs once bed available.   Assessment/plan status:  Psychosocial Support/Ongoing Assessment of Needs Other assessment/ plan:   Information/referral to community resources:   Visual merchandiser spoke with patient wife over the phone regarding specific facilities close to their home. Patient wife to  have facility list once present at the hospital.    PATIENT'S/FAMILY'S RESPONSE TO PLAN OF CARE: Patient alert and oriented x3 laying in bed.  Patient is agreeable with ST-SNF placement due to IV antibiotics and requested that his wife be contacted with information.  CSW spoke with patient wife over the phone who is also in agreement and states preference to [facility]. Patient with good family support.  Patient and patient wife verbalized their appreciation for CSW support and concern.    [name], [place] [phone]  (Weekend Coverage Only)

## 2012-06-25 NOTE — Progress Notes (Signed)
[facility] PROGRESS NOTE  Aaron Long ZOX:[id number] DOB: 26-Dec-1940 DOA: 06/19/2012 PCP: Oneal Grout, MD  Discharged, then notified by cm that patient does not have part  D and cannot afford private pay for vanc! Discharge cancelled and plans for SNF  Assessment/Plan: right forefoot cellulitis Improving. To SNF for 5 more weeks vancomycin (and transition to oral cipro)   status post recent right great toe amputation  hypertension Continue current regimen of Norvasc, Cardura.  chronic kidney disease stage III Stable.  type 2 diabetes Hemoglobin A1c 7.8. . Increase Levemir to 24 units daily. Sliding scale insulin.  severe iron deficiency anemia  severe peripheral vascular disease Continue aspirin and Plavix.  history of coronary artery disease/hyperlipidemia Stable. Continue aspirin, Norvasc, Plavix, statin.  chronic constipation Continue Colace and MiraLAX.  prophylaxis Resume SQ heparin  Code Status: Full Family Communication: Updated patient wife and daughter at bedside Disposition Plan: Home when medically stable.    Consultants:  Vascular surgery : Dr Darrick Penna 06/19/12  IR  Procedures:  Central line 6/13  Antibiotics:  IV Vancomycin 06/19/12  IV Zosyn 06/19/12  HPI/Subjective: No complaints  Objective: Filed Vitals:   06/24/12 1559 06/24/12 2121 06/25/12 0619 06/25/12 0956  BP: 150/73 158/72 167/77 111/95  Pulse: 87 85 77 82  Temp: 99.1 F (37.3 C) 99.8 F (37.7 C) 99 F (37.2 C) 98 F (36.7 C)  TempSrc:      Resp: 20 18 18 19   Height:      Weight:   90.5 kg (199 lb 8.3 oz)   SpO2: 99% 95% 96% 97%    Intake/Output Summary (Last 24 hours) at 06/25/12 1231 Last data filed at 06/25/12 0957  Gross per 24 hour  Intake    560 ml  Output    825 ml  Net   -265 ml   Filed Weights   06/22/12 2200 06/23/12 2200 06/25/12 0619  Weight: 91.8 kg (202 lb 6.1 oz) 92.1 kg (203 lb 0.7 oz) 90.5 kg (199 lb 8.3 oz)    Exam:   General:    NAD  Catheter site with clean dressing  Cardiovascular: RRR  Respiratory: CTAB  Abdomen: Soft/NT/ND/+BS  Extremities: foot less red, less indurated  Data Reviewed: Basic Metabolic Panel:  Recent Labs Lab 06/19/12 0934 06/19/12 1200 06/20/12 0505 06/21/12 0510 06/24/12 0415  NA 137  --  137 137 136  K 4.9  --  4.8 4.9 4.4  CL 104  --  105 102 101  CO2 23  --  25 24 25   GLUCOSE 172*  --  241* 286* 193*  BUN 39*  --  39* 41* 33*  CREATININE 2.60*  --  2.69* 2.98* 2.98*  CALCIUM 8.8  --  9.0 9.0 9.0  MG  --  1.9  --   --   --    Liver Function Tests: No results found for this basename: AST, ALT, ALKPHOS, BILITOT, PROT, ALBUMIN,  in the last 168 hours No results found for this basename: LIPASE, AMYLASE,  in the last 168 hours No results found for this basename: AMMONIA,  in the last 168 hours CBC:  Recent Labs Lab 06/19/12 0835 06/20/12 0505 06/21/12 0510 06/22/12 0555 06/24/12 0415  WBC 10.2 10.0 10.3  --  15.3*  HGB 9.4* 8.5* 7.6* 7.8* 8.1*  HCT 28.9* 26.3* 23.4* 24.1* 25.2*  MCV 89.2 89.8 89.0  --  88.4  PLT 301 301 318  --  409*   Cardiac Enzymes: No results found for  this basename: CKTOTAL, CKMB, CKMBINDEX, TROPONINI,  in the last 168 hours BNP (last 3 results) No results found for this basename: PROBNP,  in the last 8760 hours CBG:  Recent Labs Lab 06/24/12 1127 06/24/12 1557 06/24/12 2128 06/25/12 0750 06/25/12 1151  GLUCAP 231* 239* 220* 210* 230*    Recent Results (from the past 240 hour(s))  CULTURE, BLOOD (ROUTINE X 2)     Status: None   Collection Time    06/18/12 12:11 AM      Result Value Range Status   Specimen Description BLOOD RIGHT ARM   Final   Special Requests BOTTLES DRAWN AEROBIC AND ANAEROBIC 10CC EACH   Final   Culture  Setup Time 06/18/2012 16:24   Final   Culture NO GROWTH 5 DAYS   Final   Report Status 06/24/2012 FINAL   Final  CULTURE, BLOOD (ROUTINE X 2)     Status: None   Collection Time    06/18/12 12:20 AM       Result Value Range Status   Specimen Description BLOOD HAND RIGHT   Final   Special Requests BOTTLES DRAWN AEROBIC AND ANAEROBIC 10CC EACH   Final   Culture  Setup Time 06/18/2012 16:24   Final   Culture NO GROWTH 5 DAYS   Final   Report Status 06/24/2012 FINAL   Final  URINE CULTURE     Status: None   Collection Time    06/18/12  1:08 AM      Result Value Range Status   Specimen Description URINE, CLEAN CATCH   Final   Special Requests Normal   Final   Culture  Setup Time 06/18/2012 02:17   Final   Colony Count NO GROWTH   Final   Culture NO GROWTH   Final   Report Status 06/19/2012 FINAL   Final  CULTURE, BLOOD (ROUTINE X 2)     Status: None   Collection Time    06/19/12 10:50 AM      Result Value Range Status   Specimen Description BLOOD LEFT ARM   Final   Special Requests BOTTLES DRAWN AEROBIC AND ANAEROBIC 10CC   Final   Culture  Setup Time 06/19/2012 14:12   Final   Culture NO GROWTH 5 DAYS   Final   Report Status 06/25/2012 FINAL   Final  CULTURE, BLOOD (ROUTINE X 2)     Status: None   Collection Time    06/19/12 10:55 AM      Result Value Range Status   Specimen Description BLOOD RIGHT HAND   Final   Special Requests BOTTLES DRAWN AEROBIC ONLY 10CC   Final   Culture  Setup Time 06/19/2012 14:13   Final   Culture NO GROWTH 5 DAYS   Final   Report Status 06/25/2012 FINAL   Final  URINE CULTURE     Status: None   Collection Time    06/19/12 12:52 PM      Result Value Range Status   Specimen Description URINE, RANDOM   Final   Special Requests NONE   Final   Culture  Setup Time 06/19/2012 18:40   Final   Colony Count NO GROWTH   Final   Culture NO GROWTH   Final   Report Status 06/20/2012 FINAL   Final     Studies: No results found.  Scheduled Meds: . amLODipine  5 mg Oral Daily  . aspirin EC  81 mg Oral Daily  . atorvastatin  20 mg Oral q1800  .  clopidogrel  75 mg Oral Q breakfast  . docusate sodium  100 mg Oral BID  . doxazosin  0.5 mg Oral QHS  .  insulin aspart  0-9 Units Subcutaneous TID WC  . insulin detemir  22 Units Subcutaneous QHS  . piperacillin-tazobactam (ZOSYN)  IV  3.375 g Intravenous Q8H  . polyethylene glycol  17 g Oral BID  . pregabalin  200 mg Oral BID  . sodium chloride  3 mL Intravenous Q12H  . sodium chloride  3 mL Intravenous Q12H  . vancomycin (VANCOCIN) 750 mg IVPB  750 mg Intravenous Q24H   Continuous Infusions:   Time spent: 15 mins  Noma Quijas L  [facility] Pager [phone]. If 7PM-7AM, please contact night-coverage at [email or web address], password [facility] 06/25/2012, 12:31 PM  LOS: 6 days

## 2012-06-25 NOTE — Clinical Social Work Placement (Addendum)
Clinical Social Work Department CLINICAL SOCIAL WORK PLACEMENT NOTE 06/25/2012  Patient:  Aaron Long, Aaron Long  Account Number:  [id number] Admit date:  06/19/2012  Clinical Social Worker:  [name], LCSW  Date/time:  06/25/2012 09:45 AM  Clinical Social Work is seeking post-discharge placement for this patient at the following level of care:   SKILLED NURSING   (*CSW will update this form in Epic as items are completed)   06/25/2012  Patient/family provided with [facility] Department of Clinical Social Work's list of facilities offering this level of care within the geographic area requested by the patient (or if unable, by the patient's family).  06/25/2012  Patient/family informed of their freedom to choose among providers that offer the needed level of care, that participate in Medicare, Medicaid or managed care program needed by the patient, have an available bed and are willing to accept the patient.  06/25/2012  Patient/family informed of [facility]' ownership interest in [facility], as well as of the fact that they are under no obligation to receive care at this facility.  PASARR submitted to [employer] on 06/25/2012 PASARR number received from [employer] on 06/25/2012  FL2 transmitted to all facilities in geographic area requested by pt/family on  06/25/2012 FL2 transmitted to all facilities within larger geographic area on   Patient informed that his/her managed care company has contracts with or will negotiate with  certain facilities, including the following:     Patient/family informed of bed offers received: 06/26/12  Patient chooses bed at [facility] Physician recommends and patient chooses bed at    Patient to be transferred to [facility] on  06/26/12 Patient to be transferred to facility by ambulance  The following physician request were entered in Epic:   Additional Comments: 06/15 Patient wife preference for [facility] 06/26/12: Wife at the  bedside and aware of discharge.

## 2012-06-26 DIAGNOSIS — L0390 Cellulitis, unspecified: Secondary | ICD-10-CM | POA: Diagnosis not present

## 2012-06-26 DIAGNOSIS — L02619 Cutaneous abscess of unspecified foot: Secondary | ICD-10-CM | POA: Diagnosis not present

## 2012-06-26 DIAGNOSIS — E119 Type 2 diabetes mellitus without complications: Secondary | ICD-10-CM | POA: Diagnosis not present

## 2012-06-26 DIAGNOSIS — I2510 Atherosclerotic heart disease of native coronary artery without angina pectoris: Secondary | ICD-10-CM | POA: Diagnosis not present

## 2012-06-26 DIAGNOSIS — Z86010 Personal history of colonic polyps: Secondary | ICD-10-CM | POA: Diagnosis not present

## 2012-06-26 DIAGNOSIS — N189 Chronic kidney disease, unspecified: Secondary | ICD-10-CM | POA: Diagnosis not present

## 2012-06-26 DIAGNOSIS — Z87448 Personal history of other diseases of urinary system: Secondary | ICD-10-CM | POA: Diagnosis not present

## 2012-06-26 DIAGNOSIS — Z8679 Personal history of other diseases of the circulatory system: Secondary | ICD-10-CM | POA: Diagnosis not present

## 2012-06-26 DIAGNOSIS — R269 Unspecified abnormalities of gait and mobility: Secondary | ICD-10-CM | POA: Diagnosis not present

## 2012-06-26 DIAGNOSIS — I96 Gangrene, not elsewhere classified: Secondary | ICD-10-CM | POA: Diagnosis not present

## 2012-06-26 DIAGNOSIS — S88119A Complete traumatic amputation at level between knee and ankle, unspecified lower leg, initial encounter: Secondary | ICD-10-CM | POA: Diagnosis not present

## 2012-06-26 DIAGNOSIS — Z862 Personal history of diseases of the blood and blood-forming organs and certain disorders involving the immune mechanism: Secondary | ICD-10-CM | POA: Diagnosis not present

## 2012-06-26 DIAGNOSIS — I739 Peripheral vascular disease, unspecified: Secondary | ICD-10-CM | POA: Diagnosis not present

## 2012-06-26 DIAGNOSIS — N184 Chronic kidney disease, stage 4 (severe): Secondary | ICD-10-CM | POA: Diagnosis not present

## 2012-06-26 DIAGNOSIS — I252 Old myocardial infarction: Secondary | ICD-10-CM | POA: Diagnosis not present

## 2012-06-26 DIAGNOSIS — I10 Essential (primary) hypertension: Secondary | ICD-10-CM | POA: Diagnosis not present

## 2012-06-26 DIAGNOSIS — E875 Hyperkalemia: Secondary | ICD-10-CM | POA: Diagnosis not present

## 2012-06-26 DIAGNOSIS — D649 Anemia, unspecified: Secondary | ICD-10-CM | POA: Diagnosis not present

## 2012-06-26 DIAGNOSIS — K5900 Constipation, unspecified: Secondary | ICD-10-CM | POA: Diagnosis not present

## 2012-06-26 DIAGNOSIS — M6281 Muscle weakness (generalized): Secondary | ICD-10-CM | POA: Diagnosis not present

## 2012-06-26 DIAGNOSIS — S98119A Complete traumatic amputation of unspecified great toe, initial encounter: Secondary | ICD-10-CM | POA: Diagnosis not present

## 2012-06-26 DIAGNOSIS — Z87442 Personal history of urinary calculi: Secondary | ICD-10-CM | POA: Diagnosis not present

## 2012-06-26 DIAGNOSIS — N039 Chronic nephritic syndrome with unspecified morphologic changes: Secondary | ICD-10-CM | POA: Diagnosis not present

## 2012-06-26 DIAGNOSIS — Z7902 Long term (current) use of antithrombotics/antiplatelets: Secondary | ICD-10-CM | POA: Diagnosis not present

## 2012-06-26 DIAGNOSIS — E1059 Type 1 diabetes mellitus with other circulatory complications: Secondary | ICD-10-CM | POA: Diagnosis not present

## 2012-06-26 DIAGNOSIS — Z951 Presence of aortocoronary bypass graft: Secondary | ICD-10-CM | POA: Diagnosis not present

## 2012-06-26 DIAGNOSIS — Z87891 Personal history of nicotine dependence: Secondary | ICD-10-CM | POA: Diagnosis not present

## 2012-06-26 DIAGNOSIS — M79609 Pain in unspecified limb: Secondary | ICD-10-CM | POA: Diagnosis not present

## 2012-06-26 DIAGNOSIS — E785 Hyperlipidemia, unspecified: Secondary | ICD-10-CM | POA: Diagnosis not present

## 2012-06-26 DIAGNOSIS — Z8546 Personal history of malignant neoplasm of prostate: Secondary | ICD-10-CM | POA: Diagnosis not present

## 2012-06-26 DIAGNOSIS — Z4789 Encounter for other orthopedic aftercare: Secondary | ICD-10-CM | POA: Diagnosis not present

## 2012-06-26 DIAGNOSIS — M2180 Other specified acquired deformities of unspecified limb: Secondary | ICD-10-CM | POA: Diagnosis not present

## 2012-06-26 DIAGNOSIS — E1142 Type 2 diabetes mellitus with diabetic polyneuropathy: Secondary | ICD-10-CM | POA: Diagnosis not present

## 2012-06-26 DIAGNOSIS — G909 Disorder of the autonomic nervous system, unspecified: Secondary | ICD-10-CM | POA: Diagnosis not present

## 2012-06-26 DIAGNOSIS — E1149 Type 2 diabetes mellitus with other diabetic neurological complication: Secondary | ICD-10-CM | POA: Diagnosis not present

## 2012-06-26 DIAGNOSIS — T819XXA Unspecified complication of procedure, initial encounter: Secondary | ICD-10-CM | POA: Diagnosis not present

## 2012-06-26 DIAGNOSIS — Z792 Long term (current) use of antibiotics: Secondary | ICD-10-CM | POA: Diagnosis not present

## 2012-06-26 DIAGNOSIS — D631 Anemia in chronic kidney disease: Secondary | ICD-10-CM | POA: Diagnosis not present

## 2012-06-26 DIAGNOSIS — Z8739 Personal history of other diseases of the musculoskeletal system and connective tissue: Secondary | ICD-10-CM | POA: Diagnosis not present

## 2012-06-26 DIAGNOSIS — T8131XA Disruption of external operation (surgical) wound, not elsewhere classified, initial encounter: Secondary | ICD-10-CM | POA: Diagnosis not present

## 2012-06-26 DIAGNOSIS — D509 Iron deficiency anemia, unspecified: Secondary | ICD-10-CM | POA: Diagnosis not present

## 2012-06-26 DIAGNOSIS — T8140XA Infection following a procedure, unspecified, initial encounter: Secondary | ICD-10-CM | POA: Diagnosis not present

## 2012-06-26 DIAGNOSIS — Z8672 Personal history of thrombophlebitis: Secondary | ICD-10-CM | POA: Diagnosis not present

## 2012-06-26 DIAGNOSIS — R509 Fever, unspecified: Secondary | ICD-10-CM | POA: Diagnosis not present

## 2012-06-26 DIAGNOSIS — I129 Hypertensive chronic kidney disease with stage 1 through stage 4 chronic kidney disease, or unspecified chronic kidney disease: Secondary | ICD-10-CM | POA: Diagnosis not present

## 2012-06-26 DIAGNOSIS — S98139A Complete traumatic amputation of one unspecified lesser toe, initial encounter: Secondary | ICD-10-CM | POA: Diagnosis not present

## 2012-06-26 DIAGNOSIS — Z9861 Coronary angioplasty status: Secondary | ICD-10-CM | POA: Diagnosis not present

## 2012-06-26 DIAGNOSIS — M7989 Other specified soft tissue disorders: Secondary | ICD-10-CM | POA: Diagnosis not present

## 2012-06-26 LAB — GLUCOSE, CAPILLARY: Glucose-Capillary: 290 mg/dL — ABNORMAL HIGH (ref 70–99)

## 2012-06-26 MED ORDER — INSULIN DETEMIR 100 UNIT/ML ~~LOC~~ SOLN: 26.0000 [IU] | Freq: Every day | SUBCUTANEOUS | Status: DC

## 2012-06-26 MED ORDER — HEPARIN SOD (PORK) LOCK FLUSH 100 UNIT/ML IV SOLN
250.0000 [IU] | INTRAVENOUS | Status: AC | PRN
  Administered 2012-06-26: 500 [IU]

## 2012-06-26 MED ORDER — INSULIN ASPART 100 UNIT/ML ~~LOC~~ SOLN
5.0000 [IU] | Freq: Three times a day (TID) | SUBCUTANEOUS | Status: DC
  Administered 2012-06-26 (×2): 5 [IU] via SUBCUTANEOUS

## 2012-06-26 MED ORDER — CIPROFLOXACIN HCL 500 MG PO TABS: 500.0000 mg | ORAL_TABLET | Freq: Every day | ORAL | Status: DC

## 2012-06-26 MED ORDER — OXYCODONE HCL 5 MG PO TABS: 5.0000 mg | ORAL_TABLET | Freq: Three times a day (TID) | ORAL | Status: DC | PRN

## 2012-06-26 MED ORDER — FERROUS SULFATE 325 (65 FE) MG PO TBEC: 325.0000 mg | DELAYED_RELEASE_TABLET | Freq: Every day | ORAL | Status: DC

## 2012-06-26 MED ORDER — VANCOMYCIN HCL 1000 MG IV SOLR: 750.0000 mg | INTRAVENOUS | Status: DC

## 2012-06-26 MED ORDER — INSULIN ASPART 100 UNIT/ML ~~LOC~~ SOLN: 5.0000 [IU] | Freq: Three times a day (TID) | SUBCUTANEOUS | Status: DC

## 2012-06-26 MED ORDER — INSULIN DETEMIR 100 UNIT/ML ~~LOC~~ SOLN
26.0000 [IU] | Freq: Every day | SUBCUTANEOUS | Status: DC
  Filled 2012-06-26: qty 0.26

## 2012-06-26 MED FILL — Gelatin Absorbable Sponge 12-7 MM: CUTANEOUS | Qty: 1 | Status: AC

## 2012-06-26 NOTE — Progress Notes (Signed)
06/26/2012 6:29 PM  Report called to Marcelino Duster, RN, at [facility].  Pt prepped for DC, ambulance called.   Aaron Long

## 2012-06-26 NOTE — Progress Notes (Signed)
Occupational Therapy Treatment Patient Details Name: Aaron Long MRN: [id number] DOB: 08/31/1940 Today's Date: 06/26/2012 Time: 7829-5621 OT Time Calculation (min): 24 min  OT Assessment / Plan / Recommendation Comments on Treatment Session Pt making progress and he and his wife are now planning for him to d/c to SNF for further nurisng care and rehab before returning home. Pt should continue with acute OT services to maximize level of function and safety    Follow Up Recommendations  SNF    Barriers to Discharge       Equipment Recommendations  Tub/shower bench;Other (comment) (TBD at SNF level of care)    Recommendations for Other Services    Frequency Min 2X/week   Plan Discharge plan remains appropriate    Precautions / Restrictions Precautions Precautions: Fall Precaution Comments: Per progress notes, do not wear Darco shoe (gait to unsteady).  Pt should wear diabetic shoe Required Braces or Orthoses: Other Brace/Splint Other Brace/Splint: Prosthesis Lt. LE, specialized boot for rt foot. Restrictions Weight Bearing Restrictions: Yes RLE Weight Bearing: Weight bearing as tolerated   Pertinent Vitals/Pain     ADL  Grooming: Performed;Min guard Where Assessed - Grooming: Supported standing Lower Body Dressing: Moderate assistance;Performed Where Assessed - Lower Body Dressing: Unsupported sitting Toilet Transfer: Performed;Minimal assistance Toilet Transfer Method: Sit to stand Toilet Transfer Equipment: Bedside commode Toileting - Clothing Manipulation and Hygiene: Performed;Moderate assistance Where Assessed - Toileting Clothing Manipulation and Hygiene: Standing Transfers/Ambulation Related to ADLs: cues for saftey/correct hand placement    OT Diagnosis:    OT Problem List:   OT Treatment Interventions:     OT Goals ADL Goals ADL Goal: Grooming - Progress: Progressing toward goals ADL Goal: Lower Body Dressing - Progress: Progressing toward goals ADL  Goal: Toilet Transfer - Progress: Progressing toward goals ADL Goal: Toileting - Clothing Manipulation - Progress: Progressing toward goals  Visit Information  Last OT Received On: 06/26/12 Assistance Needed: +2 PT/OT Co-Evaluation/Treatment: Yes    Subjective Data  Subjective: " I am glad yall came to see me " Patient Stated Goal: To return home after rehab at SNF   Prior Functioning       Cognition  Cognition Arousal/Alertness: Awake/alert Behavior During Therapy: WFL for tasks assessed/performed Overall Cognitive Status: Within Functional Limits for tasks assessed    Mobility  Bed Mobility Bed Mobility: Rolling Left;Left Sidelying to Sit;Sitting - Scoot to [place] of Bed Rolling Left: 4: Min guard Left Sidelying to Sit: 5: Supervision;4: Min guard Supine to Sit: 5: Supervision Sitting - Scoot to [place] of Bed: 5: Supervision Transfers Transfers: Sit to Stand;Stand to Sit Sit to Stand: 1: +1 Total assist;From bed;From chair/3-in-1 Stand to Sit: 3: Mod assist;With upper extremity assist;With armrests;To chair/3-in-1 Details for Transfer Assistance: Verbal cues for hand placement and safety.    Exercises      Balance Static Standing Balance Static Standing - Level of Assistance: 4: Min assist Dynamic Standing Balance Dynamic Standing - Balance Support: No upper extremity supported;During functional activity Dynamic Standing - Level of Assistance: 4: Min assist Dynamic Standing - Balance Activities: Other (comment) (standing at sink for grooming tasks)   End of Session OT - End of Session Equipment Utilized During Treatment: Gait belt;Other (comment);Left lower extremity prosthesis (RW, BSC, R foot boot) Activity Tolerance: Patient tolerated treatment well Patient left: in chair;with call bell/phone within reach;with family/visitor present  GO     Galen Manila 06/26/2012, 1:37 PM

## 2012-06-26 NOTE — Discharge Summary (Signed)
Physician Discharge Summary  KODIAK ROLLYSON ZOX:[id number] DOB: [birth year] DOA: 06/19/2012  PCP: Oneal Grout, MD  Admit date: 06/19/2012 Discharge date: 06/26/2012  Time spent: 40 minutes  Discharge Diagnoses:    Cellulitis of foot, right, with recent aputation of right first toe with resection of metatarsal head secondary to gangrene, on 06/12/2012 by Dr. Fabienne Bruns.    PAD (peripheral artery disease) with angioplasty of right tibial peroneal trunk on 06/09/2012 by Dr. Darrick Penna    HTN (hypertension)    CAD (coronary artery disease)    CKD (chronic kidney disease) stage 4, GFR 15-29 ml/min    S/P BKA (below knee amputation)    Diabetic neuropathy  Type 2 diabetes with renal complications, uncontrolled    Anemia, secondary to iron deficiency and chronic renal failure  Discharge Condition: stable  Filed Weights   06/23/12 2200 06/25/12 0619 06/25/12 2144  Weight: 92.1 kg (203 lb 0.7 oz) 90.5 kg (199 lb 8.3 oz) 90.498 kg (199 lb 8.2 oz)    History of present illness:  Aaron Long is a 72 y.o. male with past medical history of severe vascular disease, hypertension, diabetes mellitus, status post left BKA, status post right great toe amputation secondary to gangrene 06/12/2012 per Dr. Darrick Penna, history of chronic kidney disease, hyperlipidemia who presents to the ED with a 2 day history of fevers as high as 101 on the morning of admission, generalized chills, generalized weakness, decreased energy, decreased strength. Patient denies any cough, no shortness of breath, no chest pain, no abdominal pain, no dysuria, no diarrhea, no melena, no hematemesis, no hematochezia, no headaches, no speech difficulties. Patient was seen in the emergency room one day prior to admission and at that time chest x-ray which was done was negative. Comprehensive metabolic profile done had a potassium of 5.6 by BUN of 49 and a creatinine of 2.99 otherwise was within normal limits. CBC done on the time  of a hemoglobin of 8.7 otherwise was within normal limits. Patient was subsequently discharged home.  Patient presented back to the ED on the morning of admission with fever and generalized chills with temp of 101. Will call to admit the patient for further evaluation and management. ED physician stated he spoke with vascular surgery will consult on the patient as well.   Hospital Course:  The patient was started on Zosyn and vancomycin. CAT scan of the foot could not rule out osteomyelitis. Dr. Darrick Penna felt that unless patient was open to BKA, would require 6 weeks of antibiotics, as this could be osteomyelitis. An MRI was not done. As patient had recently had surgery, the MRI was not felt to be helpful, as it would likely be abnormal from normal postoperative findings. Interventional radiology was consult in for an internal jugular tunneled PICC line. A peripheral PICC line was not placed due to the patient's chronic kidney disease. [name] fever defervesced. His foot became less red and indurated. Today there is mild induration and erythema mainly on the plantar aspect of the first MTP area. sutures remain in place. There is minimal occasional sanguinous drainage. No odor. Will continue vancomycin for a total of 6 weeks, but transition Zosyn to Cipro by mouth. Patient will go to skilled nursing facility for IV antibiotics and monitoring of his wound. On 721, both antibiotics can be stopped. He will need to wear his postoperative boot on his right foot whenever ambulating. Recommend continuing physical therapy.  Patient's blood glucose was difficult to control and his regimen was  adjusted up ordered. He will require continued monitoring of his blood glucose Q. a.c. and at bedtime and adjust as needed.  Procedures: Right internal jugular tunneled PICC line by interventional radiology on 06/23/2012  Consultations:  Dr. Fabienne Bruns  IR  Discharge Exam: Filed Vitals:   06/25/12 2144 06/26/12  0601 06/26/12 0923 06/26/12 1348  BP: 178/69 156/54 128/61 185/74  Pulse: 86 87 77 86  Temp: 99.3 F (37.4 C) 99.5 F (37.5 C) 99.4 F (37.4 C) 99.1 F (37.3 C)  TempSrc: Oral Oral Oral Oral  Resp: 18 18 17 17   Height: 5' 8.11" (1.73 m)     Weight: 90.498 kg (199 lb 8.2 oz)     SpO2: 99% 99% 94% 98%    General: comfortable Right foot with essentially no erythema or induration on the dorsal aspect of the foot. Wound with dried blood. No drainage. Plantar aspect just below the wound slightly erythematous and indurated. Sutures remain in place  Discharge Instructions  Discharge Orders   Future Appointments Provider Department Dept Phone   07/20/2012 10:15 AM Sherren Kerns, MD [facility] [phone]   08/22/2012 11:30 AM Oneal Grout, MD [facility] [phone]   Future Orders Complete By Expires     Diet - low sodium heart healthy  As directed     Diet Carb Modified  As directed     Discharge instructions  As directed     Comments:      Keep incisions clean and dry. Wear postoperative boot when ambulating    Discharge patient  As directed         Medication List    STOP taking these medications       cephALEXin 500 MG capsule  Commonly known as:  KEFLEX      TAKE these medications       acetaminophen 325 MG tablet  Commonly known as:  TYLENOL  Take 2 tablets (650 mg total) by mouth every 6 (six) hours as needed for pain or fever.     amLODipine 10 MG tablet  Commonly known as:  NORVASC  Take 5 mg by mouth daily.     aspirin EC 81 MG tablet  Take 81 mg by mouth every morning.     ciprofloxacin 500 MG tablet  Commonly known as:  CIPRO  Take 1 tablet (500 mg total) by mouth daily.     clopidogrel 75 MG tablet  Commonly known as:  PLAVIX  Take 1 tablet (75 mg total) by mouth daily.     docusate sodium 100 MG capsule  Commonly known as:  COLACE  Take 100 mg by mouth 2 (two) times daily.     doxazosin 1 MG tablet   Commonly known as:  CARDURA  Take 0.5 mg by mouth at bedtime.     insulin aspart 100 UNIT/ML injection  Commonly known as:  novoLOG  Inject 5 Units into the skin 3 (three) times daily with meals.     NOVOLOG FLEXPEN 100 UNIT/ML injection  Generic drug:  insulin aspart  Inject 0-5 Units into the skin 3 (three) times daily before meals. Sliding scale 0 units=below 150 anything greater 5 units     insulin detemir 100 UNIT/ML injection  Commonly known as:  LEVEMIR  Inject 0.26 mLs (26 Units total) into the skin at bedtime.     oxyCODONE 5 MG immediate release tablet  Commonly known as:  Oxy IR/ROXICODONE  Take 1 tablet (5 mg total)  by mouth every 8 (eight) hours as needed (severe pain).     polyethylene glycol packet  Commonly known as:  MIRALAX / GLYCOLAX  Take 17 g by mouth daily.     pravastatin 80 MG tablet  Commonly known as:  PRAVACHOL  Take 80 mg by mouth every morning.     pregabalin 200 MG capsule  Commonly known as:  LYRICA  Take 200 mg by mouth 2 (two) times daily.     sodium chloride 0.9 % SOLN 150 mL with vancomycin 1000 MG SOLR 750 mg  Inject 750 mg into the vein daily. Until 07/01/12       No Known Allergies     Follow-up Information   Follow up with Sherren Kerns, MD In 1 week. (Office will call you to arrange your appt (sent))    Contact information:   [address] [place] [address] [phone]        The results of significant diagnostics from this hospitalization (including imaging, microbiology, ancillary and laboratory) are listed below for reference.    Significant Diagnostic Studies: Dg Chest 2 View  06/18/2012   *RADIOLOGY REPORT*  Clinical Data: 72 year old male with fever  CHEST - 2 VIEW  Comparison: 01/28/2012 and prior chest radiographs  Findings: Cardiomegaly and CABG changes again noted. Mild basilar scarring noted. There is no evidence of focal airspace disease, pulmonary edema, suspicious pulmonary nodule/mass, pleural effusion,  or pneumothorax. No acute bony abnormalities are identified.  IMPRESSION: Cardiomegaly without  evidence of acute cardiopulmonary disease.   Original Report Authenticated By: [name] Pier, M.D.   Ir Fluoro Guide Cv Line Right  06/22/2012   *RADIOLOGY REPORT*  Indication: Renal insufficiency and foot cellulitis.  The patient needs IV antibiotics.  Request for a tunneled central venous catheter due to renal insufficiency.  PROCEDURE(S): FLUOROSCOPIC AND ULTRASOUND GUIDED PLACEMENT OF A TUNNELED DIAYSIS CATHETER  Physician:  Rachelle Hora. Henn, MD  Medications: None  Moderate sedation time:  None  Fluoroscopy time:  18 seconds.  Procedure:Informed consent was obtained for placement of a tunneled central venous catheter.  The patient was placed supine on the interventional table.  Ultrasound confirmed a patent right internal jugularvein.  Ultrasound images were obtained for documentation. The right side of the neck was prepped and draped in a sterile fashion.  The right side of the neck was anesthetized with 1% lidocaine.  Maximal barrier sterile technique was utilized including caps, mask, sterile gowns, sterile gloves, sterile drape, hand hygiene and skin antiseptic.  A small incision was made with #11 blade scalpel.  A 21 gauge needle directed into the right internal jugular vein with ultrasound guidance. A wire and dilator were advanced centrally.  A single lumen Powerline catheter was selected.  The skin below the right clavicle was anesthetized and a small incision was made with an #11 blade scalpel.  A subcutaneous tunnel was formed to the vein dermatotomy site.  The catheter was brought through the tunnel. The cuff was placed underneath the skin.  The vein dermatotomy site was dilated to accommodate a peel- away sheath.  The catheter was placed through the peel-away sheath and directed into the central venous structures.  The tip of the catheter was placed near the junction of the lower SVC and right atrium with  fluoroscopy.  Fluoroscopic images were obtained for documentation.  The lumen was found to aspirate and flush well. The vein dermatotomy site was closed using a single layer of absorbable suture and Dermabond.  The catheter  was secured to the skin using Prolene suture.  Findings:Catheter tip is near the junction of the lower SVC and right atrium.  Complications: None  Impression:Successful placement of a tunneled central venous catheter using ultrasound and fluoroscopic guidance.   Original Report Authenticated By: Richarda Overlie, M.D.   Ir US Guide Vasc Access Right  06/22/2012   *RADIOLOGY REPORT*  Indication: Renal insufficiency and foot cellulitis.  The patient needs IV antibiotics.  Request for a tunneled central venous catheter due to renal insufficiency.  PROCEDURE(S): FLUOROSCOPIC AND ULTRASOUND GUIDED PLACEMENT OF A TUNNELED DIAYSIS CATHETER  Physician:  Rachelle Hora. Henn, MD  Medications: None  Moderate sedation time:  None  Fluoroscopy time:  18 seconds.  Procedure:Informed consent was obtained for placement of a tunneled central venous catheter.  The patient was placed supine on the interventional table.  Ultrasound confirmed a patent right internal jugularvein.  Ultrasound images were obtained for documentation. The right side of the neck was prepped and draped in a sterile fashion.  The right side of the neck was anesthetized with 1% lidocaine.  Maximal barrier sterile technique was utilized including caps, mask, sterile gowns, sterile gloves, sterile drape, hand hygiene and skin antiseptic.  A small incision was made with #11 blade scalpel.  A 21 gauge needle directed into the right internal jugular vein with ultrasound guidance. A wire and dilator were advanced centrally.  A single lumen Powerline catheter was selected.  The skin below the right clavicle was anesthetized and a small incision was made with an #11 blade scalpel.  A subcutaneous tunnel was formed to the vein dermatotomy site.  The catheter was  brought through the tunnel. The cuff was placed underneath the skin.  The vein dermatotomy site was dilated to accommodate a peel- away sheath.  The catheter was placed through the peel-away sheath and directed into the central venous structures.  The tip of the catheter was placed near the junction of the lower SVC and right atrium with fluoroscopy.  Fluoroscopic images were obtained for documentation.  The lumen was found to aspirate and flush well. The vein dermatotomy site was closed using a single layer of absorbable suture and Dermabond.  The catheter was secured to the skin using Prolene suture.  Findings:Catheter tip is near the junction of the lower SVC and right atrium.  Complications: None  Impression:Successful placement of a tunneled central venous catheter using ultrasound and fluoroscopic guidance.   Original Report Authenticated By: Richarda Overlie, M.D.   Dg Abd 2 Views  06/18/2012   *RADIOLOGY REPORT*  Clinical Data: 72 year old male with abdominal pain.  ABDOMEN - 2 VIEW  Comparison: 05/05/2010 radiographs  Findings: A few nondistended gas-filled loops of small bowel within the abdomen are noted. Gas and stool within the colon identified. No dilated bowel loops are noted. There is no evidence of pneumoperitoneum. Heavy vascular calcifications and clips within the pelvis noted. No acute bony abnormalities are present.  IMPRESSION: Nonspecific nonobstructive bowel gas pattern without evidence of pneumoperitoneum.   Original Report Authenticated By: [name] Pier, M.D.   Dg Foot Complete Right  06/18/2012   *RADIOLOGY REPORT*  Clinical Data: Fever; status post right toe amputation.  RIGHT FOOT COMPLETE - 3+ VIEW  Comparison: Right great toe radiographs performed 05/09/2012  Findings: There has been interval amputation of the right great toe, including the proximal and distal phalanges, with minimal residual osseous fragments from the first proximal phalanx.  Mild associated cortical irregularity is noted  along the distal first metatarsal;  mild changes of osteomyelitis cannot be excluded, given the patient's fever.  Visualized joint spaces are grossly preserved.  Diffuse vascular calcifications are seen.  IMPRESSION:  1.  Interval amputation of the right great toe, including the proximal and distal phalanges, with minimal residual osseous fragments noted.  Mild associated cortical irregularity along the distal first metatarsal, new from the prior study.  Mild changes of osteomyelitis cannot be excluded, given the patient's symptoms. This would be better assessed on MRI, when and as clinically appropriate. 2.  Diffuse vascular calcifications seen.   Original Report Authenticated By: Tonia Ghent, M.D.    Microbiology: Recent Results (from the past 240 hour(s))  CULTURE, BLOOD (ROUTINE X 2)     Status: None   Collection Time    06/18/12 12:11 AM      Result Value Range Status   Specimen Description BLOOD RIGHT ARM   Final   Special Requests BOTTLES DRAWN AEROBIC AND ANAEROBIC 10CC EACH   Final   Culture  Setup Time 06/18/2012 16:24   Final   Culture NO GROWTH 5 DAYS   Final   Report Status 06/24/2012 FINAL   Final  CULTURE, BLOOD (ROUTINE X 2)     Status: None   Collection Time    06/18/12 12:20 AM      Result Value Range Status   Specimen Description BLOOD HAND RIGHT   Final   Special Requests BOTTLES DRAWN AEROBIC AND ANAEROBIC 10CC EACH   Final   Culture  Setup Time 06/18/2012 16:24   Final   Culture NO GROWTH 5 DAYS   Final   Report Status 06/24/2012 FINAL   Final  URINE CULTURE     Status: None   Collection Time    06/18/12  1:08 AM      Result Value Range Status   Specimen Description URINE, CLEAN CATCH   Final   Special Requests Normal   Final   Culture  Setup Time 06/18/2012 02:17   Final   Colony Count NO GROWTH   Final   Culture NO GROWTH   Final   Report Status 06/19/2012 FINAL   Final  CULTURE, BLOOD (ROUTINE X 2)     Status: None   Collection Time    06/19/12 10:50 AM       Result Value Range Status   Specimen Description BLOOD LEFT ARM   Final   Special Requests BOTTLES DRAWN AEROBIC AND ANAEROBIC 10CC   Final   Culture  Setup Time 06/19/2012 14:12   Final   Culture NO GROWTH 5 DAYS   Final   Report Status 06/25/2012 FINAL   Final  CULTURE, BLOOD (ROUTINE X 2)     Status: None   Collection Time    06/19/12 10:55 AM      Result Value Range Status   Specimen Description BLOOD RIGHT HAND   Final   Special Requests BOTTLES DRAWN AEROBIC ONLY 10CC   Final   Culture  Setup Time 06/19/2012 14:13   Final   Culture NO GROWTH 5 DAYS   Final   Report Status 06/25/2012 FINAL   Final  URINE CULTURE     Status: None   Collection Time    06/19/12 12:52 PM      Result Value Range Status   Specimen Description URINE, RANDOM   Final   Special Requests NONE   Final   Culture  Setup Time 06/19/2012 18:40   Final   Colony Count NO GROWTH   Final  Culture NO GROWTH   Final   Report Status 06/20/2012 FINAL   Final     Labs: Basic Metabolic Panel:  Recent Labs Lab 06/20/12 0505 06/21/12 0510 06/24/12 0415  NA 137 137 136  K 4.8 4.9 4.4  CL 105 102 101  CO2 25 24 25   GLUCOSE 241* 286* 193*  BUN 39* 41* 33*  CREATININE 2.69* 2.98* 2.98*  CALCIUM 9.0 9.0 9.0   Liver Function Tests: No results found for this basename: AST, ALT, ALKPHOS, BILITOT, PROT, ALBUMIN,  in the last 168 hours No results found for this basename: LIPASE, AMYLASE,  in the last 168 hours No results found for this basename: AMMONIA,  in the last 168 hours CBC:  Recent Labs Lab 06/20/12 0505 06/21/12 0510 06/22/12 0555 06/24/12 0415  WBC 10.0 10.3  --  15.3*  HGB 8.5* 7.6* 7.8* 8.1*  HCT 26.3* 23.4* 24.1* 25.2*  MCV 89.8 89.0  --  88.4  PLT 301 318  --  409*   Cardiac Enzymes: No results found for this basename: CKTOTAL, CKMB, CKMBINDEX, TROPONINI,  in the last 168 hours BNP: BNP (last 3 results) No results found for this basename: PROBNP,  in the last 8760  hours CBG:  Recent Labs Lab 06/25/12 1151 06/25/12 1624 06/25/12 2148 06/26/12 0802 06/26/12 1158  GLUCAP 230* 244* 304* 290* 251*   Signed:  Tatsuya Okray L  [facility] 06/26/2012, 2:03 PM

## 2012-06-26 NOTE — Progress Notes (Signed)
[facility] Progress Note  Pt doing well. Ambulating with PT Temp 98-99  Right foot cellulitis resolving well on Vanc and Zosyn. Right great toe amp with min serous drainage- no purulent drainage noted  Plan 6 weeks Total IV antibiotics  Vanc changed to 750mg  IIV daily - Will need new RX depending on dose at DC

## 2012-06-26 NOTE — Progress Notes (Signed)
Agree with above assessment.  Britney Captain, PT, DPT Pager #: [phone] Office #: [phone]   

## 2012-06-26 NOTE — Progress Notes (Signed)
Physical Therapy Treatment Patient Details Name: Aaron Long MRN: [id number] DOB: Jul 11, 1940 Today's Date: 06/26/2012 Time: 7829-5621 PT Time Calculation (min): 24 min  PT Assessment / Plan / Recommendation Comments on Treatment Session  Pt making steady progress with mobility at this date.  Pt's wife states plans are for him to d/c to [facility] before going home.      Follow Up Recommendations  SNF;Supervision for mobility/OOB;Home health PT     Does the patient have the potential to tolerate intense rehabilitation     Barriers to Discharge        Equipment Recommendations       Recommendations for Other Services    Frequency Min 3X/week   Plan Discharge plan needs to be updated;Frequency remains appropriate    Precautions / Restrictions Precautions Precautions: Fall Precaution Comments: Per progress notes, do not wear Darco shoe (gait to unsteady).  Pt should wear diabetic shoe Required Braces or Orthoses: Other Brace/Splint Other Brace/Splint: Prosthesis Lt. LE, specialized boot for rt foot. Restrictions Weight Bearing Restrictions: Yes RLE Weight Bearing: Weight bearing as tolerated       Mobility  Bed Mobility Bed Mobility: Supine to Sit;Sitting - Scoot to Edge of Bed Rolling Left: 4: Min guard Left Sidelying to Sit: 5: Supervision;4: Min guard Supine to Sit: 5: Supervision;With rails Sitting - Scoot to Edge of Bed: 5: Supervision Details for Bed Mobility Assistance: Increased time but able to complete without physical (A).  Transfers Transfers: Sit to Stand;Stand to Sit Sit to Stand: 3: Mod assist;With upper extremity assist;From bed;With armrests;From chair/3-in-1 Stand to Sit: 4: Min assist;With upper extremity assist;With armrests;To chair/3-in-1 Stand Pivot Transfers: 4: Min guard Details for Transfer Assistance: cues for hand placement & technique.  (A) to achieve standing, balance, & controlled descent.   Ambulation/Gait Ambulation/Gait Assistance: 4:  Min assist Ambulation Distance (Feet): 20 Feet Assistive device: Rolling walker Ambulation/Gait Assistance Details: Cues for posture, & body positioning inside RW.   (A) for balance & safety Gait Pattern: Step-through pattern;Decreased stride length;Decreased step length - right;Decreased step length - left Gait velocity: decreased Stairs: No Wheelchair Mobility Wheelchair Mobility: No      PT Goals Acute Rehab PT Goals Time For Goal Achievement: 06/27/12 Potential to Achieve Goals: Good Pt will go Supine/Side to Sit: with supervision PT Goal: Supine/Side to Sit - Progress: Met Pt will go Sit to Supine/Side: with supervision Pt will go Sit to Stand: with min assist PT Goal: Sit to Stand - Progress: Progressing toward goal Pt will go Stand to Sit: with min assist PT Goal: Stand to Sit - Progress: Progressing toward goal Pt will Transfer Bed to Chair/Chair to Bed: with supervision PT Transfer Goal: Bed to Chair/Chair to Bed - Progress: Progressing toward goal Pt will Ambulate: 16 - 50 feet;with least restrictive assistive device;with min assist PT Goal: Ambulate - Progress: Progressing toward goal Pt will Go Up / Down Stairs: 1-2 stairs;with min assist;with rail(s)  Visit Information  Last PT Received On: 06/26/12 Assistance Needed: +2    Subjective Data      Cognition  Cognition Arousal/Alertness: Awake/alert Behavior During Therapy: WFL for tasks assessed/performed Overall Cognitive Status: Within Functional Limits for tasks assessed    Balance  Static Standing Balance Static Standing - Balance Support: No upper extremity supported;During functional activity Static Standing - Level of Assistance: 4: Min assist Static Standing - Comment/# of Minutes: (A) for balance while standing at sink washing hands.   Dynamic Standing Balance Dynamic Standing - Balance  Support: No upper extremity supported;During functional activity Dynamic Standing - Level of Assistance: 4: Min  assist Dynamic Standing - Balance Activities: Other (comment) (standing at sink for grooming tasks)  End of Session PT - End of Session Equipment Utilized During Treatment: Gait belt Activity Tolerance: Patient tolerated treatment well Patient left: in chair;with call bell/phone within reach;with family/visitor present Nurse Communication: Mobility status    Verdell Face, [place] [phone] 06/26/2012

## 2012-06-27 ENCOUNTER — Other Ambulatory Visit: Payer: Self-pay | Admitting: *Deleted

## 2012-06-27 MED ORDER — OXYCODONE HCL 5 MG PO TABS: 5.0000 mg | ORAL_TABLET | Freq: Three times a day (TID) | ORAL | Status: DC | PRN

## 2012-06-28 ENCOUNTER — Encounter: Payer: Self-pay | Admitting: Adult Health

## 2012-06-28 ENCOUNTER — Non-Acute Institutional Stay (SKILLED_NURSING_FACILITY): Payer: Medicare Other | Admitting: Adult Health

## 2012-06-28 DIAGNOSIS — I2510 Atherosclerotic heart disease of native coronary artery without angina pectoris: Secondary | ICD-10-CM | POA: Diagnosis not present

## 2012-06-28 DIAGNOSIS — L02619 Cutaneous abscess of unspecified foot: Secondary | ICD-10-CM | POA: Diagnosis not present

## 2012-06-28 DIAGNOSIS — E1142 Type 2 diabetes mellitus with diabetic polyneuropathy: Secondary | ICD-10-CM

## 2012-06-28 DIAGNOSIS — I10 Essential (primary) hypertension: Secondary | ICD-10-CM

## 2012-06-28 DIAGNOSIS — E118 Type 2 diabetes mellitus with unspecified complications: Secondary | ICD-10-CM

## 2012-06-28 DIAGNOSIS — D649 Anemia, unspecified: Secondary | ICD-10-CM

## 2012-06-28 DIAGNOSIS — L03119 Cellulitis of unspecified part of limb: Secondary | ICD-10-CM

## 2012-06-28 DIAGNOSIS — N184 Chronic kidney disease, stage 4 (severe): Secondary | ICD-10-CM

## 2012-06-28 DIAGNOSIS — E1149 Type 2 diabetes mellitus with other diabetic neurological complication: Secondary | ICD-10-CM

## 2012-06-28 DIAGNOSIS — L03115 Cellulitis of right lower limb: Secondary | ICD-10-CM

## 2012-06-28 DIAGNOSIS — E1140 Type 2 diabetes mellitus with diabetic neuropathy, unspecified: Secondary | ICD-10-CM

## 2012-06-28 NOTE — Progress Notes (Signed)
  Subjective:    Patient ID: Aaron Long, male    DOB: 1940-09-30, 72 y.o.   MRN: [id number]  HPI This is a 72 year old male who has been admitted to [facility] on 06/26/12 from [facility] with Cellulitis of right foot and S/P amputation of right great toe. He has been admitted for a short-term rehabilitation.   Review of Systems  Constitutional: Negative.   HENT: Negative.   Respiratory: Negative for shortness of breath.   Cardiovascular: Positive for leg swelling. Negative for chest pain.  Gastrointestinal: Negative for abdominal distention.  Endocrine: Negative.   Genitourinary: Negative.   Neurological: Negative.   Hematological: Negative for adenopathy. Does not bruise/bleed easily.  Psychiatric/Behavioral: Negative.        Objective:   Physical Exam  Nursing note and vitals reviewed. Constitutional: He is oriented to person, place, and time. He appears well-developed and well-nourished.  HENT:  Head: Normocephalic and atraumatic.  Right Ear: External ear normal.  Left Ear: External ear normal.  Eyes: Conjunctivae and EOM are normal. Pupils are equal, round, and reactive to light.  Neck: Normal range of motion. Neck supple. No thyromegaly present.  Cardiovascular: Normal rate, regular rhythm and normal heart sounds.   Pulmonary/Chest: Effort normal and breath sounds normal. No respiratory distress.  Abdominal: Soft. Bowel sounds are normal.  Musculoskeletal: Normal range of motion. He exhibits edema. He exhibits no tenderness.  RLE edema, 2+ Right foot covered with dry dressing Left BKA with prosthesis   Neurological: He is alert and oriented to person, place, and time.  Skin: Skin is warm and dry.  Psychiatric: He has a normal mood and affect. His behavior is normal. Judgment and thought content normal.    LABS: 06/27/12  Wbc 18.6  hgb 7.4  hct 23.3  NA 141  K 4.9  Glucose 96  BUN 38  Creatinine 3.39  Calcium 8.6 hgbA1c 8.1    Medications reviewed  per [facility]      Assessment & Plan:   Diabetes mellitus type 2, controlled, with complications- well controlled  Cellulitis of foot, right - continue Cipro and Vancomycin  Anemia - stable; repeat CBC in 1 week  Peripheral vascular disease, unspecified - stable  Diabetic neuropathy, type II diabetes mellitus - stable  CKD (chronic kidney disease) stage 4, GFR 15-29 ml/min - stable  HTN (hypertension) - well-controlled  CAD (coronary artery disease) - stable

## 2012-06-29 ENCOUNTER — Non-Acute Institutional Stay (SKILLED_NURSING_FACILITY): Payer: Medicare Other | Admitting: Internal Medicine

## 2012-06-29 DIAGNOSIS — E1059 Type 1 diabetes mellitus with other circulatory complications: Secondary | ICD-10-CM

## 2012-06-29 DIAGNOSIS — L03119 Cellulitis of unspecified part of limb: Secondary | ICD-10-CM

## 2012-06-29 DIAGNOSIS — D631 Anemia in chronic kidney disease: Secondary | ICD-10-CM

## 2012-06-29 DIAGNOSIS — I2510 Atherosclerotic heart disease of native coronary artery without angina pectoris: Secondary | ICD-10-CM | POA: Diagnosis not present

## 2012-06-29 DIAGNOSIS — L02619 Cutaneous abscess of unspecified foot: Secondary | ICD-10-CM | POA: Diagnosis not present

## 2012-06-29 DIAGNOSIS — L03115 Cellulitis of right lower limb: Secondary | ICD-10-CM

## 2012-07-11 ENCOUNTER — Other Ambulatory Visit: Payer: Self-pay | Admitting: Vascular Surgery

## 2012-07-11 ENCOUNTER — Emergency Department (HOSPITAL_COMMUNITY): Payer: Medicare Other

## 2012-07-11 ENCOUNTER — Encounter (HOSPITAL_COMMUNITY): Payer: Self-pay | Admitting: Emergency Medicine

## 2012-07-11 ENCOUNTER — Other Ambulatory Visit: Payer: Self-pay | Admitting: Physician Assistant

## 2012-07-11 ENCOUNTER — Telehealth: Payer: Self-pay

## 2012-07-11 ENCOUNTER — Non-Acute Institutional Stay (SKILLED_NURSING_FACILITY): Payer: Medicare Other | Admitting: Adult Health

## 2012-07-11 ENCOUNTER — Emergency Department (HOSPITAL_COMMUNITY)
Admission: EM | Admit: 2012-07-11 | Discharge: 2012-07-11 | Disposition: A | Discharge status: Skilled Nursing Facility | Payer: Medicare Other | Attending: Emergency Medicine | Admitting: Emergency Medicine

## 2012-07-11 DIAGNOSIS — S98119A Complete traumatic amputation of unspecified great toe, initial encounter: Secondary | ICD-10-CM | POA: Insufficient documentation

## 2012-07-11 DIAGNOSIS — T8140XA Infection following a procedure, unspecified, initial encounter: Secondary | ICD-10-CM | POA: Diagnosis not present

## 2012-07-11 DIAGNOSIS — Z8546 Personal history of malignant neoplasm of prostate: Secondary | ICD-10-CM | POA: Insufficient documentation

## 2012-07-11 DIAGNOSIS — T814XXA Infection following a procedure, initial encounter: Secondary | ICD-10-CM

## 2012-07-11 DIAGNOSIS — L03119 Cellulitis of unspecified part of limb: Secondary | ICD-10-CM | POA: Diagnosis not present

## 2012-07-11 DIAGNOSIS — I2510 Atherosclerotic heart disease of native coronary artery without angina pectoris: Secondary | ICD-10-CM | POA: Insufficient documentation

## 2012-07-11 DIAGNOSIS — L02619 Cutaneous abscess of unspecified foot: Secondary | ICD-10-CM

## 2012-07-11 DIAGNOSIS — Z87448 Personal history of other diseases of urinary system: Secondary | ICD-10-CM | POA: Insufficient documentation

## 2012-07-11 DIAGNOSIS — E1149 Type 2 diabetes mellitus with other diabetic neurological complication: Secondary | ICD-10-CM | POA: Insufficient documentation

## 2012-07-11 DIAGNOSIS — Z79899 Other long term (current) drug therapy: Secondary | ICD-10-CM | POA: Insufficient documentation

## 2012-07-11 DIAGNOSIS — Z860100 Personal history of colon polyps, unspecified: Secondary | ICD-10-CM | POA: Insufficient documentation

## 2012-07-11 DIAGNOSIS — M7989 Other specified soft tissue disorders: Secondary | ICD-10-CM | POA: Diagnosis not present

## 2012-07-11 DIAGNOSIS — E1142 Type 2 diabetes mellitus with diabetic polyneuropathy: Secondary | ICD-10-CM | POA: Insufficient documentation

## 2012-07-11 DIAGNOSIS — Y838 Other surgical procedures as the cause of abnormal reaction of the patient, or of later complication, without mention of misadventure at the time of the procedure: Secondary | ICD-10-CM | POA: Insufficient documentation

## 2012-07-11 DIAGNOSIS — Z794 Long term (current) use of insulin: Secondary | ICD-10-CM | POA: Insufficient documentation

## 2012-07-11 DIAGNOSIS — Z7982 Long term (current) use of aspirin: Secondary | ICD-10-CM | POA: Insufficient documentation

## 2012-07-11 DIAGNOSIS — E875 Hyperkalemia: Secondary | ICD-10-CM | POA: Diagnosis not present

## 2012-07-11 DIAGNOSIS — Z87891 Personal history of nicotine dependence: Secondary | ICD-10-CM | POA: Insufficient documentation

## 2012-07-11 DIAGNOSIS — N189 Chronic kidney disease, unspecified: Secondary | ICD-10-CM | POA: Insufficient documentation

## 2012-07-11 DIAGNOSIS — Z862 Personal history of diseases of the blood and blood-forming organs and certain disorders involving the immune mechanism: Secondary | ICD-10-CM | POA: Insufficient documentation

## 2012-07-11 DIAGNOSIS — L03115 Cellulitis of right lower limb: Secondary | ICD-10-CM

## 2012-07-11 DIAGNOSIS — I129 Hypertensive chronic kidney disease with stage 1 through stage 4 chronic kidney disease, or unspecified chronic kidney disease: Secondary | ICD-10-CM | POA: Insufficient documentation

## 2012-07-11 DIAGNOSIS — E785 Hyperlipidemia, unspecified: Secondary | ICD-10-CM | POA: Insufficient documentation

## 2012-07-11 DIAGNOSIS — Z8739 Personal history of other diseases of the musculoskeletal system and connective tissue: Secondary | ICD-10-CM | POA: Insufficient documentation

## 2012-07-11 DIAGNOSIS — Z951 Presence of aortocoronary bypass graft: Secondary | ICD-10-CM | POA: Insufficient documentation

## 2012-07-11 DIAGNOSIS — Z8679 Personal history of other diseases of the circulatory system: Secondary | ICD-10-CM | POA: Insufficient documentation

## 2012-07-11 DIAGNOSIS — Z8672 Personal history of thrombophlebitis: Secondary | ICD-10-CM | POA: Insufficient documentation

## 2012-07-11 DIAGNOSIS — I10 Essential (primary) hypertension: Secondary | ICD-10-CM | POA: Diagnosis not present

## 2012-07-11 DIAGNOSIS — K5900 Constipation, unspecified: Secondary | ICD-10-CM | POA: Insufficient documentation

## 2012-07-11 DIAGNOSIS — Z9861 Coronary angioplasty status: Secondary | ICD-10-CM | POA: Insufficient documentation

## 2012-07-11 DIAGNOSIS — E119 Type 2 diabetes mellitus without complications: Secondary | ICD-10-CM | POA: Insufficient documentation

## 2012-07-11 DIAGNOSIS — Z87442 Personal history of urinary calculi: Secondary | ICD-10-CM | POA: Insufficient documentation

## 2012-07-11 DIAGNOSIS — S88119A Complete traumatic amputation at level between knee and ankle, unspecified lower leg, initial encounter: Secondary | ICD-10-CM | POA: Insufficient documentation

## 2012-07-11 DIAGNOSIS — Z7902 Long term (current) use of antithrombotics/antiplatelets: Secondary | ICD-10-CM | POA: Insufficient documentation

## 2012-07-11 DIAGNOSIS — R509 Fever, unspecified: Secondary | ICD-10-CM | POA: Insufficient documentation

## 2012-07-11 DIAGNOSIS — I252 Old myocardial infarction: Secondary | ICD-10-CM | POA: Insufficient documentation

## 2012-07-11 LAB — CBC WITH DIFFERENTIAL/PLATELET
Basophils Absolute: 0 10*3/uL (ref 0.0–0.1)
Basophils Relative: 1 % (ref 0–1)
Eosinophils Absolute: 0.4 10*3/uL (ref 0.0–0.7)
Eosinophils Relative: 5 % (ref 0–5)
HCT: 28.9 % — ABNORMAL LOW (ref 39.0–52.0)
MCH: 28.3 pg (ref 26.0–34.0)
MCHC: 30.8 g/dL (ref 30.0–36.0)
MCV: 92 fL (ref 78.0–100.0)
Monocytes Absolute: 0.6 10*3/uL (ref 0.1–1.0)
Platelets: 379 10*3/uL (ref 150–400)
RDW: 15.6 % — ABNORMAL HIGH (ref 11.5–15.5)

## 2012-07-11 LAB — POCT I-STAT, CHEM 8
BUN: 39 mg/dL — ABNORMAL HIGH (ref 6–23)
Calcium, Ion: 1.19 mmol/L (ref 1.13–1.30)
HCT: 29 % — ABNORMAL LOW (ref 39.0–52.0)
Hemoglobin: 9.9 g/dL — ABNORMAL LOW (ref 13.0–17.0)
TCO2: 28 mmol/L (ref 0–100)

## 2012-07-11 MED ORDER — LEVOFLOXACIN 500 MG PO TABS: 250.0000 mg | ORAL_TABLET | Freq: Every day | ORAL | Status: DC

## 2012-07-11 NOTE — ED Notes (Signed)
Placed call to [facility] for transportation back home 

## 2012-07-11 NOTE — Progress Notes (Signed)
Pt known to me.  Right first toe amp several weeks ago.  Has not really healed well.  No significant discharge.  Was sent to ER today for evaluation after having fever.    PE:   Filed Vitals:   07/11/12 1304 07/11/12 1501 07/11/12 1532  BP: 158/62 184/79 160/82  Pulse: 85 87   Temp: 98.7 F (37.1 C)  98.6 F (37 C)  TempSrc: Oral  Oral  Resp: 20 18   SpO2: 96% 96%    Right foot necrotic first toe amp no fluctuance, mild erythema  Assessment: most likely pt is going to need right BKA will continue antibiotics one more week as pt is reluctant to proceed with BKA  Plan: he has follow up with me next Thursday to discuss further.  If he develops recurrent fever or worsening of foot he will consent to BKA.  Will d/c to SNF today on levaquin in addition to his current Vanc.  Follow up next week.  Plan discussed with pt and wife.  Fabienne Bruns, MD [facility] Office: [phone] Pager: [phone]

## 2012-07-11 NOTE — ED Notes (Signed)
To ED via [facility] with c/o fevers and drainage from right foot surgery,

## 2012-07-11 NOTE — ED Provider Notes (Signed)
Dr. Darrick Penna saw pt, left progress note.  According to staff, he expressed that ED physician to discharge the patient.  I reviewed the note, indicates he will follow up with pt next Thursday which is in 9 days.  All other instructions to patient were given to him by Dr. Darrick Penna.  Gavin Pound. Brendalee Matthies, MD 07/11/12 1719

## 2012-07-11 NOTE — Telephone Encounter (Signed)
Phone call from wife.  States that the pt. Is in the ER @ [facility];  Was directed to go there per the nurse at [facility].  (nurse, Archie Patten, spoke with Joyce Gross at this office approx. 11:55 AM today, and reported fever of 102 degrees, and reported pt. Has been on IV Vancomycin; advised to go the the ER)  Wife was advised of information reported by nurse from [facility].  Wife disagreed with fevers that were reported, stating that pt. had temp of 98 degrees yesterday, and 99 degrees today.  Also voiced dissatisfaction of care being given at the nursing home.  Stated that the Wound Care Nurse has been away at a conference, and stated pt. was not receiving proper care to his foot.  Also reports that she thinks pt. has developed a pressure sore on the side of his right foot.  Advised pt's wife that pt. would be evaluated, medically, in the ER, and the MD in the ER would call in Vascular Surgery, if deemed necessary.  Wife verb. understanding.

## 2012-07-11 NOTE — ED Provider Notes (Signed)
History    CSN: [id number] Arrival date & time 07/11/12  1258  First MD Initiated Contact with Patient 07/11/12 1304     Chief Complaint  Patient presents with  . drainage from foot incision    (Consider location/radiation/quality/duration/timing/severity/associated sxs/prior Treatment) Patient is a 72 y.o. male presenting with lower extremity pain. The history is provided by the patient. No language interpreter was used.  Foot Pain Pertinent negatives include no chills, fever or numbness. Associated symptoms comments: He underwent right great toe amputation on June 2nd by Dr. Darrick Penna and is rehabbing at [facility] currently. [facility]. He was sent here for evaluation of fever and drainage from the would, however, per wife, the temperature readings taken at the nursing home have been unreliable, i.e. 102 on one reading and as low as 98 on check within a few minutes of the first. He denies any odor or increased pain..   Past Medical History  Diagnosis Date  . Prostate cancer 2010  . PAD (peripheral artery disease)   . Gangrene of toe     dry  . Neuromuscular disorder     diabetic neuropathy  . Hypertension     takes Amlodipine daily and Metoprolol bid  . Hyperlipidemia     takes Pravastatin daily  . Myocardial infarction 2005  . Peripheral neuropathy   . Constipation     takes Miralax daily  . Hemorrhoids   . Hx of colonic polyps   . History of kidney stones   . Diabetes mellitus     Novolog and Levemir daily  . Coronary artery disease   . BPH (benign prostatic hypertrophy)   . Abnormality of gait   . Coronary atherosclerosis of native coronary artery   . Phlebitis and thrombophlebitis of other deep vessels of lower extremities   . Chronic kidney disease   . Hypertrophy of prostate without urinary obstruction and other lower urinary tract symptoms (LUTS)   . Disorder of bone and cartilage, unspecified   . Lower limb amputation, below knee    . Long term (current) use of anticoagulants   . Anemia 06/19/2012   Past Surgical History  Procedure Laterality Date  . Prostate surgery  2009    prostatectomy  . Knee cartilage surgery  at age 68    left knee  . Cea      Right  . Coronary artery bypass graft  2005    x 4  . Carotid endarterectomy  2005    right  . Cataract surgery  2011    bilateral  . Colonoscopy    . Cardiac catheterization  05/28/11  . Amputation  06/14/2011    Procedure: AMPUTATION DIGIT;  Surgeon: Sherren Kerns, MD;  Location: [facility] OR;  Service: Vascular;  Laterality: Left;  Amputation Left fifth toe  . Amputation  06/16/2011    Procedure: AMPUTATION BELOW KNEE;  Surgeon: Sherren Kerns, MD;  Location: [facility] OR;  Service: Vascular;  Laterality: Left;  . I&d extremity  01/31/2012    Procedure: IRRIGATION AND DEBRIDEMENT EXTREMITY;  Surgeon: Sherren Kerns, MD;  Location: [facility] OR;  Service: Vascular;  Laterality: Left;  I & D Left BKA   . Amputation Right 06/12/2012    Procedure: AMPUTATION DIGIT;  Surgeon: Sherren Kerns, MD;  Location: [facility] OR;  Service: Vascular;  Laterality: Right;  GREAT TOE   Family History  Problem Relation Age of Onset  . Coronary artery disease Neg Hx   .  Anesthesia problems Neg Hx   . Hypotension Neg Hx   . Malignant hyperthermia Neg Hx   . Pseudochol deficiency Neg Hx   . Hyperlipidemia Mother   . Hypertension Mother   . Cancer Mother   . Hyperlipidemia Father   . Hypertension Father   . Kidney disease Father   . Heart disease Sister   . Alzheimer's disease Sister   . Cancer Brother   . Other Sister    History  Substance Use Topics  . Smoking status: Former Smoker -- 1.00 packs/day for 40 years    Types: Cigarettes    Quit date: 04/28/1991  . Smokeless tobacco: Never Used     Comment: occ alcohol  . Alcohol Use: Yes     Comment: occasional    Review of Systems  Constitutional: Negative for fever and chills.  Respiratory: Negative.   Cardiovascular: Negative.    Musculoskeletal:       See HPI  Skin:       See HPI.  Neurological: Negative.  Negative for numbness.    Allergies  Review of patient's allergies indicates no known allergies.  Home Medications   Current Outpatient Rx  Name  Route  Sig  Dispense  Refill  . acetaminophen (TYLENOL) 325 MG tablet   Oral   Take 2 tablets (650 mg total) by mouth every 6 (six) hours as needed for pain or fever.         [name] amLODipine (NORVASC) 5 MG tablet   Oral   Take 5 mg by mouth daily.         [name] aspirin EC 81 MG tablet   Oral   Take 81 mg by mouth every morning.          [name] atorvastatin (LIPITOR) 10 MG tablet   Oral   Take 10 mg by mouth daily.         . ciprofloxacin (CIPRO) 500 MG tablet   Oral   Take 1 tablet (500 mg total) by mouth daily. Until 07/31/12      0   . clopidogrel (PLAVIX) 75 MG tablet   Oral   Take 1 tablet (75 mg total) by mouth daily.   30 tablet   11   . docusate sodium (COLACE) 100 MG capsule   Oral   Take 100 mg by mouth 2 (two) times daily.          [name] doxazosin (CARDURA) 1 MG tablet   Oral   Take 0.5 mg by mouth at bedtime.          . gabapentin (NEURONTIN) 800 MG tablet   Oral   Take 800 mg by mouth 2 (two) times daily.         . insulin aspart (NOVOLOG FLEXPEN) 100 UNIT/ML injection   Subcutaneous   Inject 0-5 Units into the skin 3 (three) times daily before meals. Sliding scale 0 units=below 150 anything greater 5 units         . insulin detemir (LEVEMIR) 100 UNIT/ML injection   Subcutaneous   Inject 0.26 mLs (26 Units total) into the skin at bedtime.   10 mL   12   . oxyCODONE (OXY IR/ROXICODONE) 5 MG immediate release tablet   Oral   Take 1 tablet (5 mg total) by mouth every 8 (eight) hours as needed (severe pain).   90 tablet   0   . polyethylene glycol (MIRALAX / GLYCOLAX) packet   Oral   Take 17 g by  mouth daily.         . sodium chloride 0.9 % SOLN 150 mL with vancomycin 1000 MG SOLR 750 mg   Intravenous   Inject  750 mg into the vein daily. Until 07/31/12          BP 158/62  Pulse 85  Temp(Src) 98.7 F (37.1 C) (Oral)  Resp 20  SpO2 96% Physical Exam  Constitutional: He is oriented to person, place, and time. He appears well-developed and well-nourished.  Neck: Normal range of motion.  Pulmonary/Chest: Effort normal.  Musculoskeletal: Normal range of motion.  Right foot has no swelling. Great toe surgically absent. There is eschar over distal stump that is nontender. There is a 3cm x 2cm area over lateral 1st MTP joint that is fluctuant and tender. No drainage noted. No redness or warmth.  Neurological: He is alert and oriented to person, place, and time.  Skin: Skin is warm and dry.  Psychiatric: He has a normal mood and affect.    ED Course  Procedures (including critical care time) Labs Reviewed - No data to display No results found. No diagnosis found. 1. Post-operative complication MDM  Patient declines pain medication.   Discussed abnormal xray findings (? Early osteomyelitis) with Dr. Darrick Penna who will see patient in the ED. Baseline lab studies pending.   Arnoldo Hooker, PA-C 07/11/12 1547

## 2012-07-11 NOTE — Discharge Instructions (Signed)
 Surgical Site Infection A surgical site infection can occur after surgery. It happens in the part of the body where the surgery took place. Most patients who have surgery do not develop an infection.  SYMPTOMS  Redness and pain around the surgical site.  Drainage of cloudy fluid from the wound.  Fever. PREVENTION Before the procedure:  Tell your caregiver about any medical problems you may have. Health problems such as allergies, diabetes, and obesity could affect your surgery and treatment.  Quit smoking. Patients who smoke get more infections. Talk to your caregiver about how you can quit before your surgery.  Do not shave near the area where you will have surgery. Shaving with a razor can irritate your skin and make it easier to get an infection. Your caregiver may use an electric clipper to remove some of your hair immediately before surgery.  Ask if you will get antibiotic medicine. In most cases, you should get antibiotics within 60 minutes before surgery. Antibiotics should be stopped within 24 hours after surgery.  Your caregivers will clean their hands and arms up to their elbows with an antiseptic agent just before the surgery. Your caregivers will also clean their hands with soap and water  or an alcohol-based hand rub before and after caring for each patient.  Your caregivers will wear hair covers, masks, gowns, and gloves during surgery to keep the surgery area clean.  Your caregivers will clean the skin at the surgery site with a soap that kills germs. After the procedure:  Make sure your caregivers clean their hands before examining you. They may use soap and water  or an alcohol-based hand rub. If you do not see your caregivers clean their hands, ask them to do so.  Make sure family and friends who visit you do not touch the surgical wound or dressings.  Ask family and friends to clean their hands with soap and water  or an alcohol-based hand rub before and after visiting  you.  Make sure you know how to care for your wound before you leave the hospital. Your caregiver will tell you how to take care of your wound.  Make sure you know whom to contact if you have questions or problems after you get home. TREATMENT Most surgical site infections can be treated with antibiotics. Sometimes, patients need another surgery to treat the infection. HOME CARE INSTRUCTIONS Always clean your hands before and after caring for your wound. SEEK IMMEDIATE MEDICAL CARE IF: You have any symptoms of an infection, such as drainage, fever, or redness and pain at the surgery site. Document Released: 03/25/2010 Document Revised: 03/22/2011 Document Reviewed: 03/25/2010 [facility] Patient Information 2014 [place], [place].     Please follow all instructions provided to you by Dr. Harvey.

## 2012-07-12 ENCOUNTER — Non-Acute Institutional Stay (SKILLED_NURSING_FACILITY): Payer: Medicare Other | Admitting: Adult Health

## 2012-07-12 ENCOUNTER — Encounter: Payer: Self-pay | Admitting: Adult Health

## 2012-07-12 DIAGNOSIS — I10 Essential (primary) hypertension: Secondary | ICD-10-CM

## 2012-07-12 DIAGNOSIS — E875 Hyperkalemia: Secondary | ICD-10-CM

## 2012-07-12 DIAGNOSIS — L02619 Cutaneous abscess of unspecified foot: Secondary | ICD-10-CM | POA: Diagnosis not present

## 2012-07-12 NOTE — ED Provider Notes (Signed)
Medical screening examination/treatment/procedure(s) were conducted as a shared visit with non-physician practitioner(s) and myself.  I personally evaluated the patient during the encounter   Rolan Bucco, MD 07/12/12 [phone]

## 2012-07-12 NOTE — Progress Notes (Addendum)
  Subjective:    Patient ID: Aaron Long, male    DOB: October 08, 1940, 72 y.o.   MRN: [id number]  HPI  This is a 72 year old male who was noted to have moderate serosanguinous drainage on his  right foot. Patient had fever over the weekend - T 101.2 . He is currently on Vancomycin and Cipro for Cellulitis of right foot and is a recent S/P right great toe amputation.  Noted K= 5.8. No complaints of chest pain nor shortness of breath.   Review of Systems  Constitutional: Negative.   HENT: Negative.   Respiratory: Negative for shortness of breath.   Cardiovascular: Positive for leg swelling. Negative for chest pain.  Gastrointestinal: Negative for abdominal distention.  Endocrine: Negative.   Genitourinary: Negative.   Neurological: Negative.   Hematological: Negative for adenopathy. Does not bruise/bleed easily.  Psychiatric/Behavioral: Negative.        Objective:   Physical Exam  Nursing note and vitals reviewed. Constitutional: He is oriented to person, place, and time. He appears well-developed and well-nourished.  HENT:  Head: Normocephalic and atraumatic.  Right Ear: External ear normal.  Left Ear: External ear normal.  Eyes: Conjunctivae and EOM are normal. Pupils are equal, round, and reactive to light.  Neck: Normal range of motion. Neck supple. No thyromegaly present.  Cardiovascular: Normal rate, regular rhythm and normal heart sounds.   Pulmonary/Chest: Effort normal and breath sounds normal. No respiratory distress.  Abdominal: Soft. Bowel sounds are normal.  Musculoskeletal: Normal range of motion. He exhibits edema. He exhibits no tenderness.  RLE edema, 2+ Right foot covered with dry dressing Left BKA with prosthesis   Neurological: He is alert and oriented to person, place, and time.  Skin: Skin is warm.  Psychiatric: He has a normal mood and affect. His behavior is normal. Judgment and thought content normal.    LABS: 07/11/12  WBC 8.1 hemoglobin 8.6  hematocrit 27.6 sodium 141 potassium 5.8 glucose 105  BUN 37 creatinine 2.63  Calcium 9.0 06/27/12  Wbc 18.6  hgb 7.4  hct 23.3  NA 141  K 4.9  Glucose 96  BUN 38  Creatinine 3.39  Calcium 8.6 hgbA1c 8.1    Medications reviewed per [facility]      Assessment & Plan:    Cellulitis of foot, right - continue Cipro and Vancomycin; follow-up with Dr. Darrick Penna as soon as possible  Hyperkalemia - Kayexalate 15 g/60 mL by mouth x1; BMP in a.m.

## 2012-07-12 NOTE — Progress Notes (Signed)
  Subjective:    Patient ID: Aaron Long, male    DOB: Dec 04, 1940, 72 y.o.   MRN: [id number]  HPI  This is a 72 year old male who was noted to have K =5.7 . Patient had Kayexalate 15gm/60 ml yesterday due to K  5.8.  He has CKD stage 4 . Patient was transferred to hospital yesterday for moderate serosanguinous drainage on his right foot/Cellulitis. He came back to facility with new orders for Levaquin antibiotic. Noted BPs 147/74, 168/96 and 160/98 - elevated. No complaints of headache nor dizziness.   Review of Systems  Constitutional: Negative.   HENT: Negative.   Respiratory: Negative for shortness of breath.   Cardiovascular: Positive for leg swelling. Negative for chest pain.  Gastrointestinal: Negative for abdominal distention.  Endocrine: Negative.   Genitourinary: Negative.   Neurological: Negative.   Hematological: Negative for adenopathy. Does not bruise/bleed easily.  Psychiatric/Behavioral: Negative.        Objective:   Physical Exam  Nursing note and vitals reviewed. Constitutional: He is oriented to person, place, and time. He appears well-developed and well-nourished.  HENT:  Head: Normocephalic and atraumatic.  Right Ear: External ear normal.  Left Ear: External ear normal.  Eyes: Conjunctivae and EOM are normal. Pupils are equal, round, and reactive to light.  Neck: Normal range of motion. Neck supple. No thyromegaly present.  Cardiovascular: Normal rate, regular rhythm and normal heart sounds.   Pulmonary/Chest: Effort normal and breath sounds normal. No respiratory distress.  Abdominal: Soft. Bowel sounds are normal.  Musculoskeletal: Normal range of motion. He exhibits edema. He exhibits no tenderness.  RLE edema, 2+ Right foot covered with dry dressing Left BKA with prosthesis   Neurological: He is alert and oriented to person, place, and time.  Skin: Skin is warm.  Psychiatric: He has a normal mood and affect. His behavior is normal. Judgment and  thought content normal.    LABS:  07/12/12  sodium 139 potassium 5.7 glucose 139 BUN 35 creatinine 2.94 07/11/12  WBC 8.1 hemoglobin 8.6 hematocrit 27.6 sodium 141 potassium 5.8 glucose 105  BUN 37 creatinine 2.63  Calcium 9.0 06/27/12  Wbc 18.6  hgb 7.4  hct 23.3  NA 141  K 4.9  Glucose 96  BUN 38  Creatinine 3.39  Calcium 8.6 hgbA1c 8.1    Medications reviewed per [facility]      Assessment & Plan:    Hyperkalemia - Kayexalate 15 g/60 mL by mouth x1; BMP in a.m.  Hypertension  - increase Norvasc to 10 mg PO Q D; BP/HR Q shift x 1 week

## 2012-07-18 ENCOUNTER — Encounter: Payer: Self-pay | Admitting: Adult Health

## 2012-07-19 ENCOUNTER — Encounter: Payer: Self-pay | Admitting: Vascular Surgery

## 2012-07-20 ENCOUNTER — Encounter: Payer: Self-pay | Admitting: Vascular Surgery

## 2012-07-20 ENCOUNTER — Ambulatory Visit (INDEPENDENT_AMBULATORY_CARE_PROVIDER_SITE_OTHER): Payer: Medicare Other | Admitting: Vascular Surgery

## 2012-07-20 VITALS — BP 171/67 | HR 88 | Temp 98.6°F | Ht 68.0 in | Wt 212.0 lb

## 2012-07-20 DIAGNOSIS — I739 Peripheral vascular disease, unspecified: Secondary | ICD-10-CM

## 2012-07-20 NOTE — Progress Notes (Signed)
Patient is a 72 year old male who is status post right first toe amputation approximately one month ago. He returns for followup today. He was seen in the emergency room last week with some increased drainage pain and swelling in his right foot. Cipro was added to his current regimen of vancomycin. We have also limited the amount of ambulation on his left foot. He is primarily just pivoting on this and going to the bathroom. He denies any fever or chills. He has had no significant drainage from the toe the last several days. He does not really have any further options for revascularization.  Physical exam:  Filed Vitals:   07/20/12 1006  BP: 171/67  Pulse: 88  Temp: 98.6 F (37 C)  TempSrc: Oral  Height: 5\' 8"  (1.727 m)  Weight: 212 lb (96.163 kg)  SpO2: 97%   Right foot:  There is 4 cm of dry eschar at the toe indication site. Sutures were removed today. There is no significant surrounding erythema. The plantar aspect of his foot which was more swollen has now dried out and is less swollen there is no drainage  Assessment: Improved right first toe amputation still at risk for nonhealing or progressive infection but improved from last week  Plan: Continue dry dressing right foot continue Cipro and vancomycin followup 2 weeks  Fabienne Bruns, MD [facility] [place] Office: [phone] Pager: [phone]

## 2012-07-24 DIAGNOSIS — E1051 Type 1 diabetes mellitus with diabetic peripheral angiopathy without gangrene: Secondary | ICD-10-CM | POA: Insufficient documentation

## 2012-07-24 DIAGNOSIS — N189 Chronic kidney disease, unspecified: Secondary | ICD-10-CM | POA: Insufficient documentation

## 2012-07-24 NOTE — Progress Notes (Signed)
Patient ID: Aaron Long, male   DOB: [birth year], 72 y.o.   MRN: [id number]        HISTORY & PHYSICAL  DATE: 06/29/2012   FACILITY: [facility]  LEVEL OF CARE: SNF (31)  ALLERGIES:  No Known Allergies  CHIEF COMPLAINT:  Manage right foot cellulitis, diabetes mellitus, and coronary artery disease.    HISTORY OF PRESENT ILLNESS:  The patient is a 72 year-old, African-American male.    RIGHT FOOT CELLULITIS:  Patient presented to the hospital with fever of 101.  He is status post right great toe amputation secondary to gangrene and was diagnosed with right foot cellulitis.  He was started on IV Zosyn and vancomycin.  CT scan of the foot could not rule out osteomyelitis.  However, MRI could not be done due to recent surgery.  Therefore, it is recommended to continue with IV vancomycin for six weeks, and Zosyn was transitioned to oral Cipro.  He is admitted to this facility for short-term rehabilitation, IV antibiotics, and wound care.   Currently, he denies foot pain.    DM:pt's DM remains stable.  Pt denies polyuria, polydipsia, polyphagia, changes in vision or hypoglycemic episodes.  No complications noted from the medication presently being used.  Last hemoglobin A1c is: A recent  hemoglobin A1C is not available.   CAD: The angina has been stable. The patient denies dyspnea on exertion, orthopnea, pedal edema, palpitations and paroxysmal nocturnal dyspnea. No complications noted from the medication presently being used.    PAST MEDICAL HISTORY :  Past Medical History  Diagnosis Date  . Prostate cancer 2010  . PAD (peripheral artery disease)   . Gangrene of toe     dry  . Neuromuscular disorder     diabetic neuropathy  . Hypertension     takes Amlodipine daily and Metoprolol bid  . Hyperlipidemia     takes Pravastatin daily  . Myocardial infarction 2005  . Peripheral neuropathy   . Constipation     takes Miralax daily  . Hemorrhoids   . Hx of colonic polyps    . History of kidney stones   . Diabetes mellitus     Novolog and Levemir daily  . Coronary artery disease   . BPH (benign prostatic hypertrophy)   . Abnormality of gait   . Coronary atherosclerosis of native coronary artery   . Phlebitis and thrombophlebitis of other deep vessels of lower extremities   . Chronic kidney disease   . Hypertrophy of prostate without urinary obstruction and other lower urinary tract symptoms (LUTS)   . Disorder of bone and cartilage, unspecified   . Lower limb amputation, below knee   . Long term (current) use of anticoagulants   . Anemia 06/19/2012    PAST SURGICAL HISTORY: Past Surgical History  Procedure Laterality Date  . Prostate surgery  2009    prostatectomy  . Knee cartilage surgery  at age 45    left knee  . Cea      Right  . Coronary artery bypass graft  2005    x 4  . Carotid endarterectomy  2005    right  . Cataract surgery  2011    bilateral  . Colonoscopy    . Cardiac catheterization  05/28/11  . Amputation  06/14/2011    Procedure: AMPUTATION DIGIT;  Surgeon: Sherren Kerns, MD;  Location: [facility] OR;  Service: Vascular;  Laterality: Left;  Amputation Left fifth toe  . Amputation  06/16/2011  Procedure: AMPUTATION BELOW KNEE;  Surgeon: Sherren Kerns, MD;  Location: [facility] OR;  Service: Vascular;  Laterality: Left;  . I&d extremity  01/31/2012    Procedure: IRRIGATION AND DEBRIDEMENT EXTREMITY;  Surgeon: Sherren Kerns, MD;  Location: [facility] OR;  Service: Vascular;  Laterality: Left;  I & D Left BKA   . Amputation Right 06/12/2012    Procedure: AMPUTATION DIGIT;  Surgeon: Sherren Kerns, MD;  Location: [facility] OR;  Service: Vascular;  Laterality: Right;  GREAT TOE    SOCIAL HISTORY:  reports that he quit smoking about 21 years ago. His smoking use included Cigarettes. He has a 40 pack-year smoking history. He has never used smokeless tobacco. He reports that  drinks alcohol. He reports that he does not use illicit drugs.  FAMILY HISTORY:   Family History  Problem Relation Age of Onset  . Coronary artery disease Neg Hx   . Anesthesia problems Neg Hx   . Hypotension Neg Hx   . Malignant hyperthermia Neg Hx   . Pseudochol deficiency Neg Hx   . Hyperlipidemia Mother   . Hypertension Mother   . Cancer Mother   . Hyperlipidemia Father   . Hypertension Father   . Kidney disease Father   . Heart disease Sister   . Alzheimer's disease Sister   . Cancer Brother   . Other Sister     CURRENT MEDICATIONS: Reviewed per [facility]  REVIEW OF SYSTEMS:  See HPI otherwise 14 point ROS is negative.  PHYSICAL EXAMINATION  VS:  T 98.4      P 74      RR 20      BP 136/76      POX 97%        WT (Lb)  GENERAL: no acute distress, normal body habitus EYES: conjunctivae normal, sclerae normal, normal eye lids MOUTH/THROAT: lips without lesions,no lesions in the mouth,tongue is without lesions,uvula elevates in midline NECK: supple, trachea midline, no neck masses, no thyroid tenderness, no thyromegaly LYMPHATICS: no LAN in the neck, no supraclavicular LAN RESPIRATORY: breathing is even & unlabored, BS CTAB CARDIAC: RRR, no murmur,no extra heart sounds EDEMA/VARICOSITIES: right lower extremity has +1 pitting edema ARTERIAL: right lower extremity is in a boot GI:  ABDOMEN: abdomen soft, normal BS, no masses, no tenderness  LIVER/SPLEEN: no hepatomegaly, no splenomegaly MUSCULOSKELETAL: HEAD: normal to inspection & palpation BACK: no kyphosis, scoliosis or spinal processes tenderness EXTREMITIES: LEFT UPPER EXTREMITY: full range of motion, normal strength & tone RIGHT UPPER EXTREMITY:  full range of motion, normal strength & tone LEFT LOWER EXTREMITY: status post left BKA RIGHT LOWER EXTREMITY: right foot is in a boot PSYCHIATRIC: the patient is alert & oriented to person, affect & behavior appropriate  LABS/RADIOLOGY: Chest x-ray:  Negative.    Abdominal x-ray:  No acute findings.  Right foot x-ray showed amputation of the right  great toe, including the proximal and distal phalanges.  Osteomyelitis cannot be excluded.    Blood culture x2 showed no growth.    Urine culture showed no growth.    Glucose 193, BUN 33, creatinine 2.98, otherwise BMP normal.    WBC 15.3, hemoglobin 8.1, MCV 88.4, platelets 409.    ASSESSMENT/PLAN:  Right foot cellulitis.  Continue antibiotics as prescribed, and wound care.  Diabetes mellitus with vascular complications.  Check hemoglobin A1c.    Coronary artery disease.  Stable.    Anemia of chronic kidney disease.  Reassess hemoglobin level.    Peripheral neuropathy.  Continue  Lyrica.    Peripheral vascular disease.  Status post left BKA and right great toe amputation.    Renovascular hypertension.  Adequately controlled.   Chronic kidney disease stage IV.  Reassess renal functions.   Check CBC and BMP.   I have reviewed patient's medical records received at admission/from hospitalization.  CPT CODE: 78295

## 2012-07-27 ENCOUNTER — Encounter: Payer: Self-pay | Admitting: Adult Health

## 2012-07-31 ENCOUNTER — Non-Acute Institutional Stay (SKILLED_NURSING_FACILITY): Payer: Medicare Other | Admitting: Internal Medicine

## 2012-07-31 DIAGNOSIS — L03119 Cellulitis of unspecified part of limb: Secondary | ICD-10-CM

## 2012-07-31 DIAGNOSIS — K5900 Constipation, unspecified: Secondary | ICD-10-CM

## 2012-07-31 DIAGNOSIS — I2510 Atherosclerotic heart disease of native coronary artery without angina pectoris: Secondary | ICD-10-CM | POA: Diagnosis not present

## 2012-07-31 DIAGNOSIS — I739 Peripheral vascular disease, unspecified: Secondary | ICD-10-CM

## 2012-07-31 DIAGNOSIS — L03115 Cellulitis of right lower limb: Secondary | ICD-10-CM

## 2012-07-31 DIAGNOSIS — L02619 Cutaneous abscess of unspecified foot: Secondary | ICD-10-CM

## 2012-08-02 ENCOUNTER — Encounter: Payer: Self-pay | Admitting: Vascular Surgery

## 2012-08-03 ENCOUNTER — Other Ambulatory Visit: Payer: Self-pay

## 2012-08-03 ENCOUNTER — Ambulatory Visit (INDEPENDENT_AMBULATORY_CARE_PROVIDER_SITE_OTHER): Payer: Medicare Other | Admitting: Vascular Surgery

## 2012-08-03 ENCOUNTER — Encounter: Payer: Self-pay | Admitting: Vascular Surgery

## 2012-08-03 VITALS — BP 151/75 | HR 80 | Temp 98.7°F | Resp 16 | Ht 68.0 in | Wt 212.0 lb

## 2012-08-03 DIAGNOSIS — I739 Peripheral vascular disease, unspecified: Secondary | ICD-10-CM

## 2012-08-03 DIAGNOSIS — Z48812 Encounter for surgical aftercare following surgery on the circulatory system: Secondary | ICD-10-CM

## 2012-08-03 NOTE — Progress Notes (Signed)
Patient is a 71-year-old male who returns for followup today. He had amputation of his right first toe several weeks ago. This has not healed well. He is currently on IV vancomycin for osteomyelitis of the foot. When he was seen several weeks ago this did have some evidence of healing. He now reports that he has had increased drainage from the foot.  Past Medical History  Diagnosis Date  . Prostate cancer 2010  . PAD (peripheral artery disease)   . Gangrene of toe     dry  . Neuromuscular disorder     diabetic neuropathy  . Hypertension     takes Amlodipine daily and Metoprolol bid  . Hyperlipidemia     takes Pravastatin daily  . Myocardial infarction 2005  . Peripheral neuropathy   . Constipation     takes Miralax daily  . Hemorrhoids   . Hx of colonic polyps   . History of kidney stones   . Diabetes mellitus     Novolog and Levemir daily  . Coronary artery disease   . BPH (benign prostatic hypertrophy)   . Abnormality of gait   . Coronary atherosclerosis of native coronary artery   . Phlebitis and thrombophlebitis of other deep vessels of lower extremities   . Chronic kidney disease   . Hypertrophy of prostate without urinary obstruction and other lower urinary tract symptoms (LUTS)   . Disorder of bone and cartilage, unspecified   . Lower limb amputation, below knee   . Long term (current) use of anticoagulants   . Anemia 06/19/2012    Past Surgical History  Procedure Laterality Date  . Prostate surgery  2009    prostatectomy  . Knee cartilage surgery  at age 21    left knee  . Cea      Right  . Coronary artery bypass graft  2005    x 4  . Carotid endarterectomy  2005    right  . Cataract surgery  2011    bilateral  . Colonoscopy    . Cardiac catheterization  05/28/11  . Amputation  06/14/2011    Procedure: AMPUTATION DIGIT;  Surgeon: Charles E Fields, MD;  Location: [facility] OR;  Service: Vascular;  Laterality: Left;  Amputation Left fifth toe  . Amputation  06/16/2011    Procedure: AMPUTATION BELOW KNEE;  Surgeon: Charles E Fields, MD;  Location: [facility] OR;  Service: Vascular;  Laterality: Left;  . I&d extremity  01/31/2012    Procedure: IRRIGATION AND DEBRIDEMENT EXTREMITY;  Surgeon: Charles E Fields, MD;  Location: [facility] OR;  Service: Vascular;  Laterality: Left;  I & D Left BKA   . Amputation Right 06/12/2012    Procedure: AMPUTATION DIGIT;  Surgeon: Charles E Fields, MD;  Location: [facility] OR;  Service: Vascular;  Laterality: Right;  GREAT TOE    Current Outpatient Prescriptions on File Prior to Visit  Medication Sig Dispense Refill  . acetaminophen (TYLENOL) 325 MG tablet Take 2 tablets (650 mg total) by mouth every 6 (six) hours as needed for pain or fever.      . amLODipine (NORVASC) 5 MG tablet Take 5 mg by mouth daily.      . aspirin EC 81 MG tablet Take 81 mg by mouth every morning.       . atorvastatin (LIPITOR) 10 MG tablet Take 10 mg by mouth daily.      . ciprofloxacin (CIPRO) 500 MG tablet Take 1 tablet (500 mg total) by mouth daily. Until 07/31/12      0  . clopidogrel (PLAVIX) 75 MG tablet Take 1 tablet (75 mg total) by mouth daily.  30 tablet  11  . docusate sodium (COLACE) 100 MG capsule Take 100 mg by mouth 2 (two) times daily.       . doxazosin (CARDURA) 1 MG tablet Take 0.5 mg by mouth at bedtime.       . gabapentin (NEURONTIN) 800 MG tablet Take 800 mg by mouth 2 (two) times daily.      . insulin aspart (NOVOLOG FLEXPEN) 100 UNIT/ML injection Inject 0-5 Units into the skin 3 (three) times daily before meals. Sliding scale 0 units=below 150 anything greater 5 units      . insulin detemir (LEVEMIR) 100 UNIT/ML injection Inject 0.26 mLs (26 Units total) into the skin at bedtime.  10 mL  12  . levofloxacin (LEVAQUIN) 500 MG tablet Take 0.5 tablets (250 mg total) by mouth daily.  10 tablet  0  . oxyCODONE (OXY IR/ROXICODONE) 5 MG immediate release tablet Take 1 tablet (5 mg total) by mouth every 8 (eight) hours as needed (severe pain).  90 tablet  0  .  polyethylene glycol (MIRALAX / GLYCOLAX) packet Take 17 g by mouth daily.      . sodium chloride 0.9 % SOLN 150 mL with vancomycin 1000 MG SOLR 750 mg Inject 750 mg into the vein daily. Until 07/31/12       No current facility-administered medications on file prior to visit.    No Known Allergies   Review of systems: The patient states overall his renal function has been stable.  Physical exam:  Filed Vitals:   08/03/12 1253  BP: 151/75  Pulse: 80  Temp: 98.7 F (37.1 C)  TempSrc: Oral  Resp: 16  Height: 5' 8" (1.727 m)  Weight: 212 lb (96.163 kg)  SpO2: 98%   Right lower extremity: Right first toe amputation site has soft wet appearing tissue with some serous drainage the tip of the right second toe is ulcerated there is no significant erythema up into the foot or ankle.  Assessment: Poorly healing right first toe amputation with osteomyelitis not responding to IV antibiotics Plan: Right below-knee amputation July 29 risks benefits possible complications and procedure details of the amputation were explained to the patient today. This primarily include possibility of a nonhealing below-knee amputation requiring further amputations. He understands and agrees to proceed.  Charles Fields, MD [facility] [place] Office: [phone] Pager: [phone]  

## 2012-08-04 ENCOUNTER — Encounter (HOSPITAL_COMMUNITY): Payer: Self-pay | Admitting: Pharmacy Technician

## 2012-08-07 ENCOUNTER — Encounter (HOSPITAL_COMMUNITY): Payer: Self-pay | Admitting: *Deleted

## 2012-08-07 MED ORDER — DEXTROSE 5 % IV SOLN
1.5000 g | INTRAVENOUS | Status: AC
  Administered 2012-08-08: 1.5 g via INTRAVENOUS
  Filled 2012-08-07: qty 1.5

## 2012-08-07 NOTE — Progress Notes (Signed)
Pt denies SOB and chest pain. Pt is currently under the care Dr. Sanjuana Kava at [place]. Pt spouse stated that she thinks that pt had an echo" at Dr. Weldon Picking around the time that he had the other stuff done." Pt made aware to Stop taking Aspirin, Coumadin, Plavix, and herbal medications. Do not take any NSAIDs ie: Ibuprofen, Advil, Naproxen or any medication containing Aspirin.

## 2012-08-08 ENCOUNTER — Encounter (HOSPITAL_COMMUNITY): Payer: Self-pay | Admitting: Anesthesiology

## 2012-08-08 ENCOUNTER — Encounter (HOSPITAL_COMMUNITY): Payer: Self-pay | Admitting: *Deleted

## 2012-08-08 ENCOUNTER — Encounter (HOSPITAL_COMMUNITY)
Admission: RE | Disposition: A | Discharge status: Rehabilitation Facility | Payer: Self-pay | Source: Ambulatory Visit | Attending: Vascular Surgery

## 2012-08-08 ENCOUNTER — Inpatient Hospital Stay (HOSPITAL_COMMUNITY): Payer: Medicare Other | Admitting: Anesthesiology

## 2012-08-08 ENCOUNTER — Inpatient Hospital Stay (HOSPITAL_COMMUNITY)
Admission: RE | Admit: 2012-08-08 | Discharge: 2012-08-11 | DRG: 908 | Disposition: A | Discharge status: Rehabilitation Facility | Payer: Medicare Other | Source: Ambulatory Visit | Attending: Vascular Surgery | Admitting: Vascular Surgery

## 2012-08-08 DIAGNOSIS — I2510 Atherosclerotic heart disease of native coronary artery without angina pectoris: Secondary | ICD-10-CM | POA: Diagnosis present

## 2012-08-08 DIAGNOSIS — I96 Gangrene, not elsewhere classified: Secondary | ICD-10-CM | POA: Diagnosis present

## 2012-08-08 DIAGNOSIS — L03115 Cellulitis of right lower limb: Secondary | ICD-10-CM

## 2012-08-08 DIAGNOSIS — F05 Delirium due to known physiological condition: Secondary | ICD-10-CM | POA: Diagnosis not present

## 2012-08-08 DIAGNOSIS — I70269 Atherosclerosis of native arteries of extremities with gangrene, unspecified extremity: Secondary | ICD-10-CM | POA: Diagnosis not present

## 2012-08-08 DIAGNOSIS — N189 Chronic kidney disease, unspecified: Secondary | ICD-10-CM | POA: Diagnosis present

## 2012-08-08 DIAGNOSIS — E1142 Type 2 diabetes mellitus with diabetic polyneuropathy: Secondary | ICD-10-CM | POA: Diagnosis present

## 2012-08-08 DIAGNOSIS — F99 Mental disorder, not otherwise specified: Secondary | ICD-10-CM | POA: Diagnosis not present

## 2012-08-08 DIAGNOSIS — S88119A Complete traumatic amputation at level between knee and ankle, unspecified lower leg, initial encounter: Secondary | ICD-10-CM

## 2012-08-08 DIAGNOSIS — E785 Hyperlipidemia, unspecified: Secondary | ICD-10-CM | POA: Diagnosis present

## 2012-08-08 DIAGNOSIS — R609 Edema, unspecified: Secondary | ICD-10-CM | POA: Diagnosis not present

## 2012-08-08 DIAGNOSIS — I252 Old myocardial infarction: Secondary | ICD-10-CM | POA: Diagnosis not present

## 2012-08-08 DIAGNOSIS — E1149 Type 2 diabetes mellitus with other diabetic neurological complication: Secondary | ICD-10-CM | POA: Diagnosis present

## 2012-08-08 DIAGNOSIS — N186 End stage renal disease: Secondary | ICD-10-CM | POA: Diagnosis not present

## 2012-08-08 DIAGNOSIS — M869 Osteomyelitis, unspecified: Secondary | ICD-10-CM | POA: Diagnosis present

## 2012-08-08 DIAGNOSIS — C61 Malignant neoplasm of prostate: Secondary | ICD-10-CM | POA: Diagnosis present

## 2012-08-08 DIAGNOSIS — K5900 Constipation, unspecified: Secondary | ICD-10-CM | POA: Diagnosis present

## 2012-08-08 DIAGNOSIS — I129 Hypertensive chronic kidney disease with stage 1 through stage 4 chronic kidney disease, or unspecified chronic kidney disease: Secondary | ICD-10-CM | POA: Diagnosis present

## 2012-08-08 DIAGNOSIS — IMO0001 Reserved for inherently not codable concepts without codable children: Secondary | ICD-10-CM | POA: Diagnosis not present

## 2012-08-08 DIAGNOSIS — Z794 Long term (current) use of insulin: Secondary | ICD-10-CM

## 2012-08-08 DIAGNOSIS — T4275XA Adverse effect of unspecified antiepileptic and sedative-hypnotic drugs, initial encounter: Secondary | ICD-10-CM | POA: Diagnosis not present

## 2012-08-08 DIAGNOSIS — N400 Enlarged prostate without lower urinary tract symptoms: Secondary | ICD-10-CM | POA: Diagnosis present

## 2012-08-08 DIAGNOSIS — T8189XA Other complications of procedures, not elsewhere classified, initial encounter: Secondary | ICD-10-CM | POA: Diagnosis not present

## 2012-08-08 DIAGNOSIS — D62 Acute posthemorrhagic anemia: Secondary | ICD-10-CM | POA: Diagnosis not present

## 2012-08-08 DIAGNOSIS — G547 Phantom limb syndrome without pain: Secondary | ICD-10-CM | POA: Diagnosis not present

## 2012-08-08 DIAGNOSIS — Z7901 Long term (current) use of anticoagulants: Secondary | ICD-10-CM

## 2012-08-08 DIAGNOSIS — M86179 Other acute osteomyelitis, unspecified ankle and foot: Secondary | ICD-10-CM | POA: Diagnosis not present

## 2012-08-08 DIAGNOSIS — Z5189 Encounter for other specified aftercare: Secondary | ICD-10-CM | POA: Diagnosis not present

## 2012-08-08 DIAGNOSIS — S68118A Complete traumatic metacarpophalangeal amputation of other finger, initial encounter: Secondary | ICD-10-CM

## 2012-08-08 DIAGNOSIS — Z452 Encounter for adjustment and management of vascular access device: Secondary | ICD-10-CM | POA: Diagnosis not present

## 2012-08-08 DIAGNOSIS — I739 Peripheral vascular disease, unspecified: Secondary | ICD-10-CM | POA: Diagnosis present

## 2012-08-08 DIAGNOSIS — F0280 Dementia in other diseases classified elsewhere without behavioral disturbance: Secondary | ICD-10-CM | POA: Diagnosis not present

## 2012-08-08 HISTORY — PX: AMPUTATION: SHX166

## 2012-08-08 LAB — CBC
MCH: 28.8 pg (ref 26.0–34.0)
MCHC: 31.4 g/dL (ref 30.0–36.0)
MCV: 91.5 fL (ref 78.0–100.0)
Platelets: 185 10*3/uL (ref 150–400)
Platelets: 170 10*3/uL (ref 150–400)
RDW: 16.3 % — ABNORMAL HIGH (ref 11.5–15.5)
RDW: 16.2 % — ABNORMAL HIGH (ref 11.5–15.5)
WBC: 7.9 10*3/uL (ref 4.0–10.5)

## 2012-08-08 LAB — SURGICAL PCR SCREEN: MRSA, PCR: NEGATIVE

## 2012-08-08 LAB — GLUCOSE, CAPILLARY: Glucose-Capillary: 248 mg/dL — ABNORMAL HIGH (ref 70–99)

## 2012-08-08 LAB — BASIC METABOLIC PANEL
CO2: 28 mEq/L (ref 19–32)
GFR calc non Af Amer: 23 mL/min — ABNORMAL LOW (ref >90)
Glucose, Bld: 152 mg/dL — ABNORMAL HIGH (ref 70–99)
Potassium: 5.2 mEq/L — ABNORMAL HIGH (ref 3.5–5.1)

## 2012-08-08 LAB — HEMOGLOBIN A1C: Hgb A1c MFr Bld: 6.8 % — ABNORMAL HIGH (ref <5.7)

## 2012-08-08 LAB — CREATININE, SERUM: Creatinine, Ser: 2.51 mg/dL — ABNORMAL HIGH (ref 0.50–1.35)

## 2012-08-08 SURGERY — AMPUTATION BELOW KNEE
Anesthesia: General | Site: Leg Lower | Laterality: Right | Wound class: Clean

## 2012-08-08 MED ORDER — ACETAMINOPHEN 325 MG PO TABS
325.0000 mg | ORAL_TABLET | ORAL | Status: DC | PRN
  Administered 2012-08-09 – 2012-08-10 (×2): 650 mg via ORAL
  Filled 2012-08-08 (×3): qty 2

## 2012-08-08 MED ORDER — MUPIROCIN 2 % EX OINT
TOPICAL_OINTMENT | Freq: Once | CUTANEOUS | Status: DC
  Filled 2012-08-08: qty 22

## 2012-08-08 MED ORDER — HYDROMORPHONE HCL PF 1 MG/ML IJ SOLN
INTRAMUSCULAR | Status: AC
  Filled 2012-08-08: qty 1

## 2012-08-08 MED ORDER — OXYCODONE HCL 5 MG/5ML PO SOLN: 5.0000 mg | Freq: Once | ORAL | Status: DC | PRN

## 2012-08-08 MED ORDER — HYDRALAZINE HCL 20 MG/ML IJ SOLN: 10.0000 mg | INTRAMUSCULAR | Status: DC | PRN

## 2012-08-08 MED ORDER — PANTOPRAZOLE SODIUM 40 MG PO TBEC
40.0000 mg | DELAYED_RELEASE_TABLET | Freq: Every day | ORAL | Status: DC
  Administered 2012-08-08 – 2012-08-11 (×4): 40 mg via ORAL
  Filled 2012-08-08 (×3): qty 1

## 2012-08-08 MED ORDER — ONDANSETRON HCL 4 MG/2ML IJ SOLN
INTRAMUSCULAR | Status: DC | PRN
  Administered 2012-08-08: 4 mg via INTRAVENOUS

## 2012-08-08 MED ORDER — OXYCODONE HCL 5 MG PO TABS: 5.0000 mg | ORAL_TABLET | Freq: Three times a day (TID) | ORAL | Status: DC | PRN

## 2012-08-08 MED ORDER — GUAIFENESIN-DM 100-10 MG/5ML PO SYRP: 15.0000 mL | ORAL_SOLUTION | ORAL | Status: DC | PRN

## 2012-08-08 MED ORDER — PROPOFOL 10 MG/ML IV BOLUS
INTRAVENOUS | Status: DC | PRN
  Administered 2012-08-08: 150 mg via INTRAVENOUS

## 2012-08-08 MED ORDER — GABAPENTIN 400 MG PO CAPS
800.0000 mg | ORAL_CAPSULE | Freq: Three times a day (TID) | ORAL | Status: DC
  Administered 2012-08-08 – 2012-08-09 (×5): 800 mg via ORAL
  Filled 2012-08-08 (×9): qty 2

## 2012-08-08 MED ORDER — CLOPIDOGREL BISULFATE 75 MG PO TABS
75.0000 mg | ORAL_TABLET | Freq: Every day | ORAL | Status: DC
  Administered 2012-08-09 – 2012-08-11 (×3): 75 mg via ORAL
  Filled 2012-08-08 (×3): qty 1

## 2012-08-08 MED ORDER — OXYCODONE HCL 5 MG PO TABS: 5.0000 mg | ORAL_TABLET | Freq: Once | ORAL | Status: DC | PRN

## 2012-08-08 MED ORDER — ALBUMIN HUMAN 5 % IV SOLN
INTRAVENOUS | Status: DC | PRN
  Administered 2012-08-08: 12:00:00 via INTRAVENOUS

## 2012-08-08 MED ORDER — 0.9 % SODIUM CHLORIDE (POUR BTL) OPTIME
TOPICAL | Status: DC | PRN
  Administered 2012-08-08: 1000 mL

## 2012-08-08 MED ORDER — PHENYLEPHRINE HCL 10 MG/ML IJ SOLN
INTRAMUSCULAR | Status: DC | PRN
  Administered 2012-08-08: 80 ug via INTRAVENOUS

## 2012-08-08 MED ORDER — HYDROMORPHONE HCL PF 1 MG/ML IJ SOLN
0.2500 mg | INTRAMUSCULAR | Status: DC | PRN
  Administered 2012-08-08 (×3): 0.5 mg via INTRAVENOUS

## 2012-08-08 MED ORDER — ONDANSETRON HCL 4 MG/2ML IJ SOLN
4.0000 mg | Freq: Four times a day (QID) | INTRAMUSCULAR | Status: DC | PRN
  Administered 2012-08-08: 4 mg via INTRAVENOUS
  Filled 2012-08-08: qty 2

## 2012-08-08 MED ORDER — SODIUM CHLORIDE 0.9 % IV SOLN
INTRAVENOUS | Status: DC
  Administered 2012-08-08: 14:00:00 via INTRAVENOUS

## 2012-08-08 MED ORDER — SODIUM CHLORIDE 0.9 % IV SOLN: INTRAVENOUS | Status: DC

## 2012-08-08 MED ORDER — AMLODIPINE BESYLATE 5 MG PO TABS
5.0000 mg | ORAL_TABLET | Freq: Every day | ORAL | Status: DC
  Administered 2012-08-09 – 2012-08-11 (×3): 5 mg via ORAL
  Filled 2012-08-08 (×3): qty 1

## 2012-08-08 MED ORDER — PROMETHAZINE HCL 25 MG/ML IJ SOLN: 6.2500 mg | INTRAMUSCULAR | Status: DC | PRN

## 2012-08-08 MED ORDER — LABETALOL HCL 5 MG/ML IV SOLN
10.0000 mg | INTRAVENOUS | Status: DC | PRN
  Filled 2012-08-08: qty 4

## 2012-08-08 MED ORDER — DEXTROSE 5 % IV SOLN
1.5000 g | Freq: Two times a day (BID) | INTRAVENOUS | Status: AC
  Administered 2012-08-08 – 2012-08-09 (×2): 1.5 g via INTRAVENOUS
  Filled 2012-08-08 (×2): qty 1.5

## 2012-08-08 MED ORDER — ALUM & MAG HYDROXIDE-SIMETH 200-200-20 MG/5ML PO SUSP: 15.0000 mL | ORAL | Status: DC | PRN

## 2012-08-08 MED ORDER — GABAPENTIN 800 MG PO TABS
800.0000 mg | ORAL_TABLET | Freq: Two times a day (BID) | ORAL | Status: DC
  Filled 2012-08-08 (×2): qty 1

## 2012-08-08 MED ORDER — MORPHINE SULFATE 2 MG/ML IJ SOLN
2.0000 mg | INTRAMUSCULAR | Status: DC | PRN
  Administered 2012-08-08: 4 mg via INTRAVENOUS
  Administered 2012-08-08: 2 mg via INTRAVENOUS
  Administered 2012-08-08: 4 mg via INTRAVENOUS
  Administered 2012-08-08 – 2012-08-09 (×6): 2 mg via INTRAVENOUS
  Filled 2012-08-08: qty 1
  Filled 2012-08-08: qty 2
  Filled 2012-08-08 (×5): qty 1
  Filled 2012-08-08: qty 2
  Filled 2012-08-08: qty 1

## 2012-08-08 MED ORDER — PHENOL 1.4 % MT LIQD
1.0000 | OROMUCOSAL | Status: DC | PRN
  Filled 2012-08-08: qty 177

## 2012-08-08 MED ORDER — ACETAMINOPHEN 650 MG RE SUPP
325.0000 mg | RECTAL | Status: DC | PRN
Start: 2012-08-08 — End: 2012-08-11

## 2012-08-08 MED ORDER — HYDROMORPHONE HCL PF 1 MG/ML IJ SOLN
INTRAMUSCULAR | Status: AC
  Administered 2012-08-08: 0.5 mg via INTRAVENOUS
  Filled 2012-08-08: qty 1

## 2012-08-08 MED ORDER — INSULIN ASPART 100 UNIT/ML ~~LOC~~ SOLN
0.0000 [IU] | Freq: Three times a day (TID) | SUBCUTANEOUS | Status: DC
  Administered 2012-08-08: 8 [IU] via SUBCUTANEOUS
  Administered 2012-08-09: 3 [IU] via SUBCUTANEOUS
  Administered 2012-08-09: 5 [IU] via SUBCUTANEOUS
  Administered 2012-08-09: 3 [IU] via SUBCUTANEOUS
  Administered 2012-08-11: 2 [IU] via SUBCUTANEOUS

## 2012-08-08 MED ORDER — ENOXAPARIN SODIUM 30 MG/0.3ML ~~LOC~~ SOLN
30.0000 mg | SUBCUTANEOUS | Status: DC
  Administered 2012-08-09 – 2012-08-11 (×3): 30 mg via SUBCUTANEOUS
  Filled 2012-08-08 (×3): qty 0.3

## 2012-08-08 MED ORDER — BISACODYL 5 MG PO TBEC
5.0000 mg | DELAYED_RELEASE_TABLET | Freq: Two times a day (BID) | ORAL | Status: DC
  Administered 2012-08-08 – 2012-08-10 (×6): 5 mg via ORAL
  Filled 2012-08-08 (×6): qty 1

## 2012-08-08 MED ORDER — POLYETHYLENE GLYCOL 3350 17 G PO PACK
17.0000 g | PACK | Freq: Two times a day (BID) | ORAL | Status: DC
  Administered 2012-08-08 – 2012-08-11 (×6): 17 g via ORAL
  Filled 2012-08-08 (×8): qty 1

## 2012-08-08 MED ORDER — FENTANYL CITRATE 0.05 MG/ML IJ SOLN
INTRAMUSCULAR | Status: DC | PRN
  Administered 2012-08-08 (×2): 50 ug via INTRAVENOUS
  Administered 2012-08-08 (×2): 25 ug via INTRAVENOUS

## 2012-08-08 MED ORDER — LIDOCAINE HCL (CARDIAC) 20 MG/ML IV SOLN
INTRAVENOUS | Status: DC | PRN
  Administered 2012-08-08: 50 mg via INTRAVENOUS

## 2012-08-08 MED ORDER — DOXAZOSIN MESYLATE 1 MG PO TABS
0.5000 mg | ORAL_TABLET | Freq: Every day | ORAL | Status: DC
  Administered 2012-08-08 – 2012-08-10 (×3): 0.5 mg via ORAL
  Filled 2012-08-08 (×5): qty 0.5

## 2012-08-08 MED ORDER — ATORVASTATIN CALCIUM 10 MG PO TABS
10.0000 mg | ORAL_TABLET | Freq: Every day | ORAL | Status: DC
  Administered 2012-08-08 – 2012-08-09 (×2): 10 mg via ORAL
  Filled 2012-08-08 (×5): qty 1

## 2012-08-08 MED ORDER — METOPROLOL TARTRATE 1 MG/ML IV SOLN: 2.0000 mg | INTRAVENOUS | Status: DC | PRN

## 2012-08-08 MED ORDER — DOCUSATE SODIUM 100 MG PO CAPS
100.0000 mg | ORAL_CAPSULE | Freq: Every day | ORAL | Status: DC
  Administered 2012-08-09 – 2012-08-11 (×3): 100 mg via ORAL
  Filled 2012-08-08 (×3): qty 1

## 2012-08-08 MED ORDER — ASPIRIN EC 81 MG PO TBEC
81.0000 mg | DELAYED_RELEASE_TABLET | Freq: Every morning | ORAL | Status: DC
  Administered 2012-08-09 – 2012-08-11 (×3): 81 mg via ORAL
  Filled 2012-08-08 (×3): qty 1

## 2012-08-08 MED ORDER — EPHEDRINE SULFATE 50 MG/ML IJ SOLN
INTRAMUSCULAR | Status: DC | PRN
  Administered 2012-08-08: 10 mg via INTRAVENOUS
  Administered 2012-08-08: 5 mg via INTRAVENOUS
  Administered 2012-08-08: 10 mg via INTRAVENOUS
  Administered 2012-08-08: 5 mg via INTRAVENOUS
  Administered 2012-08-08: 10 mg via INTRAVENOUS

## 2012-08-08 MED ORDER — LACTATED RINGERS IV SOLN
INTRAVENOUS | Status: DC | PRN
  Administered 2012-08-08 (×2): via INTRAVENOUS

## 2012-08-08 SURGICAL SUPPLY — 58 items
BANDAGE ELASTIC 6 VELCRO ST LF (GAUZE/BANDAGES/DRESSINGS) ×2 IMPLANT
BANDAGE ESMARK 6X9 LF (GAUZE/BANDAGES/DRESSINGS) IMPLANT
BANDAGE GAUZE ELAST BULKY 4 IN (GAUZE/BANDAGES/DRESSINGS) ×3 IMPLANT
BLADE SAW RECIP 87.9 MT (BLADE) ×2 IMPLANT
BNDG CMPR 9X6 STRL LF SNTH (GAUZE/BANDAGES/DRESSINGS)
BNDG COHESIVE 6X5 TAN STRL LF (GAUZE/BANDAGES/DRESSINGS) ×2 IMPLANT
BNDG ESMARK 6X9 LF (GAUZE/BANDAGES/DRESSINGS)
CANISTER SUCTION 2500CC (MISCELLANEOUS) ×2 IMPLANT
CLIP TI MEDIUM 6 (CLIP) ×1 IMPLANT
CLOTH BEACON ORANGE TIMEOUT ST (SAFETY) ×2 IMPLANT
COVER SURGICAL LIGHT HANDLE (MISCELLANEOUS) ×2 IMPLANT
COVER TABLE BACK 60X90 (DRAPES) ×2 IMPLANT
CUFF TOURNIQUET SINGLE 18IN (TOURNIQUET CUFF) IMPLANT
CUFF TOURNIQUET SINGLE 24IN (TOURNIQUET CUFF) IMPLANT
CUFF TOURNIQUET SINGLE 34IN LL (TOURNIQUET CUFF) IMPLANT
CUFF TOURNIQUET SINGLE 44IN (TOURNIQUET CUFF) IMPLANT
DRAIN CHANNEL 10M FLAT 3/4 FLT (DRAIN) ×1 IMPLANT
DRAIN CHANNEL 19F RND (DRAIN) IMPLANT
DRAPE ORTHO SPLIT 77X108 STRL (DRAPES) ×4
DRAPE PROXIMA HALF (DRAPES) ×4 IMPLANT
DRAPE SURG ORHT 6 SPLT 77X108 (DRAPES) ×2 IMPLANT
DRSG ADAPTIC 3X8 NADH LF (GAUZE/BANDAGES/DRESSINGS) ×2 IMPLANT
ELECT REM PT RETURN 9FT ADLT (ELECTROSURGICAL) ×2
ELECTRODE REM PT RTRN 9FT ADLT (ELECTROSURGICAL) ×1 IMPLANT
EVACUATOR SILICONE 100CC (DRAIN) ×1 IMPLANT
GLOVE BIO SURGEON STRL SZ 6.5 (GLOVE) ×2 IMPLANT
GLOVE BIO SURGEON STRL SZ7.5 (GLOVE) ×2 IMPLANT
GLOVE BIOGEL PI IND STRL 6.5 (GLOVE) IMPLANT
GLOVE BIOGEL PI IND STRL 8 (GLOVE) ×1 IMPLANT
GLOVE BIOGEL PI INDICATOR 6.5 (GLOVE) ×1
GLOVE BIOGEL PI INDICATOR 8 (GLOVE) ×1
GLOVE SURG SS PI 7.0 STRL IVOR (GLOVE) ×1 IMPLANT
GOWN PREVENTION PLUS XLARGE (GOWN DISPOSABLE) IMPLANT
GOWN STRL NON-REIN LRG LVL3 (GOWN DISPOSABLE) ×2 IMPLANT
KIT BASIN OR (CUSTOM PROCEDURE TRAY) ×2 IMPLANT
KIT ROOM TURNOVER OR (KITS) ×2 IMPLANT
NS IRRIG 1000ML POUR BTL (IV SOLUTION) ×2 IMPLANT
PACK GENERAL/GYN (CUSTOM PROCEDURE TRAY) ×2 IMPLANT
PAD ARMBOARD 7.5X6 YLW CONV (MISCELLANEOUS) ×4 IMPLANT
PADDING CAST COTTON 6X4 STRL (CAST SUPPLIES) IMPLANT
SPONGE GAUZE 4X4 12PLY (GAUZE/BANDAGES/DRESSINGS) ×3 IMPLANT
STAPLER VISISTAT 35W (STAPLE) ×3 IMPLANT
STOCKINETTE IMPERVIOUS LG (DRAPES) ×2 IMPLANT
SUT ETHILON 3 0 PS 1 (SUTURE) ×1 IMPLANT
SUT SILK 2 0 (SUTURE) ×2
SUT SILK 2 0 SH CR/8 (SUTURE) ×4 IMPLANT
SUT SILK 2-0 18XBRD TIE 12 (SUTURE) ×1 IMPLANT
SUT SILK 3 0 (SUTURE) ×2
SUT SILK 3-0 18XBRD TIE 12 (SUTURE) IMPLANT
SUT VIC AB 2-0 CT1 27 (SUTURE) ×4
SUT VIC AB 2-0 CT1 TAPERPNT 27 (SUTURE) ×2 IMPLANT
SUT VIC AB 2-0 SH 18 (SUTURE) ×10 IMPLANT
SUT VIC AB 3-0 SH 27 (SUTURE) ×4
SUT VIC AB 3-0 SH 27X BRD (SUTURE) ×2 IMPLANT
TOWEL OR 17X24 6PK STRL BLUE (TOWEL DISPOSABLE) ×2 IMPLANT
TOWEL OR 17X26 10 PK STRL BLUE (TOWEL DISPOSABLE) ×2 IMPLANT
UNDERPAD 30X30 INCONTINENT (UNDERPADS AND DIAPERS) ×2 IMPLANT
WATER STERILE IRR 1000ML POUR (IV SOLUTION) ×2 IMPLANT

## 2012-08-08 NOTE — Anesthesia Postprocedure Evaluation (Signed)
  Anesthesia Post-op Note  Patient: Aaron Long  Procedure(s) Performed: Procedure(s): AMPUTATION BELOW KNEE (Right)  Patient Location: PACU  Anesthesia Type:General  Level of Consciousness: awake  Airway and Oxygen Therapy: Patient Spontanous Breathing  Post-op Pain: mild  Post-op Assessment: Post-op Vital signs reviewed  Post-op Vital Signs: stable  Complications: No apparent anesthesia complications

## 2012-08-08 NOTE — Anesthesia Procedure Notes (Signed)
Procedure Name: LMA Insertion Date/Time: 08/08/2012 11:00 AM Performed by: Carmela Rima Pre-anesthesia Checklist: Patient identified, Timeout performed, Emergency Drugs available, Suction available and Patient being monitored Patient Re-evaluated:Patient Re-evaluated prior to inductionOxygen Delivery Method: Circle system utilized Preoxygenation: Pre-oxygenation with 100% oxygen Intubation Type: IV induction Ventilation: Mask ventilation without difficulty LMA: LMA inserted LMA Size: 4.0 Grade View: Grade I Tube type: Oral Number of attempts: 1 Placement Confirmation: positive ETCO2 and breath sounds checked- equal and bilateral Tube secured with: Tape Dental Injury: Teeth and Oropharynx as per pre-operative assessment

## 2012-08-08 NOTE — Op Note (Signed)
Procedure: Right below-knee amputation  Preoperative diagnosis: Gangrene right foot  Postoperative diagnosis: Same  Anesthesia Gen.  Assistant: Doreatha Massed PA-C  Upper findings: well perfused muscle tissue, 10 flat JP drain  Operative details: After obtaining informed consent, the patient was taken to the operating room. The patient was placed in supine position on the operating table. After induction of general anesthesia and endotracheal intubation, the patient's entire right lower extremity was prepped and draped all the way down to the level of the ankle. Next a transverse incision was made approximately 4 fingerbreadths below the tibial tuberosity on the right leg.  The  incision was carried posteriorly to the midportion of the leg and then extended longitudinally to create a posterior flap. The subcutaneous tissues and fascia was taken down with cautery. Periosteum was raised on the tibia approximately 5 cm above the skin edge.  The periosteum was also raised on the fibula several centimeters above this. The tibia was then divided with a saw. The fibula was divided with a bone cutter. The leg was then elevated in the operative field and the amputation was completed posterior to the bones with an amputation knife. Hemostasis was then obtained with cautery and several suture ligatures. The tibial and sural nerves were pulled down into the field transected and allowed to retract up into the leg.  After hemostasis was obtained, the wound was thoroughly irrigated with  normal saline solution. The fascial edges were then reapproximated with interrupted 2-0 Vicryl sutures. A 10 flat JP drain was placed in the deep space of the leg due to edema fluid.  This was brought out of the leg medially through a separate stab incision and sutured to the skin with a 3 0 Nylon suture.  Subcutaneous tissues were reapproximated using running 3-0 Vicryl suture. The skin was closed with staples. The patient tolerated  the procedure well and there were no complications. Instrument sponge and  needle counts were correct at the end of the case. The patient was taken to the recovery room in stable condition.  Fabienne Bruns, MD [facility] [place] Office: [phone] Pager: [phone]

## 2012-08-08 NOTE — Anesthesia Preprocedure Evaluation (Addendum)
Anesthesia Evaluation  Patient identified by MRN, date of birth, ID band Patient awake    Reviewed: Allergy & Precautions, H&P , NPO status , Patient's Chart, lab work & pertinent test results  Airway Mallampati: I TM Distance: >3 FB Neck ROM: Full    Dental  (+) Dental Advidsory Given and Edentulous Upper   Pulmonary  breath sounds clear to auscultation        Cardiovascular hypertension, Pt. on medications + CAD, + Past MI, + CABG and + Peripheral Vascular Disease Rhythm:Regular Rate:Normal     Neuro/Psych  Neuromuscular disease    GI/Hepatic   Endo/Other  diabetes, Type 2, Insulin Dependent and Oral Hypoglycemic Agents  Renal/GU Renal InsufficiencyRenal disease     Musculoskeletal   Abdominal   Peds  Hematology  (+) Blood dyscrasia, anemia ,   Anesthesia Other Findings   Reproductive/Obstetrics                         Anesthesia Physical Anesthesia Plan  ASA: III  Anesthesia Plan: General   Post-op Pain Management:    Induction:   Airway Management Planned: LMA  Additional Equipment:   Intra-op Plan:   Post-operative Plan: Extubation in OR  Informed Consent:   Dental advisory given and Dental Advisory Given  Plan Discussed with: CRNA, Surgeon and Anesthesiologist  Anesthesia Plan Comments:        Anesthesia Quick Evaluation

## 2012-08-08 NOTE — Interval H&P Note (Signed)
History and Physical Interval Note:  08/08/2012 7:32 AM  Aaron Long  has presented today for surgery, with the diagnosis of nonhealing 1st toe amputation site of right foot Osteomyelitis Right Foot   The various methods of treatment have been discussed with the patient and family. After consideration of risks, benefits and other options for treatment, the patient has consented to  Procedure(s): AMPUTATION BELOW KNEE (Right) as a surgical intervention .  [name] history has been reviewed, patient examined, no change in status, stable for surgery.  I have reviewed the patient's chart and labs.  Questions were answered to the patient's satisfaction.     FIELDS,CHARLES E

## 2012-08-08 NOTE — Progress Notes (Signed)
UR COMPLETED  

## 2012-08-08 NOTE — Transfer of Care (Signed)
Immediate Anesthesia Transfer of Care Note  Patient: Aaron Long  Procedure(s) Performed: Procedure(s): AMPUTATION BELOW KNEE (Right)  Patient Location: PACU  Anesthesia Type:General  Level of Consciousness: awake, alert  and oriented  Airway & Oxygen Therapy: Patient Spontanous Breathing and Patient connected to nasal cannula oxygen  Post-op Assessment: Report given to PACU RN, Post -op Vital signs reviewed and stable and Patient moving all extremities X 4  Post vital signs: Reviewed and stable  Complications: No apparent anesthesia complications

## 2012-08-08 NOTE — Preoperative (Signed)
Beta Blockers   Reason not to administer Beta Blockers:Not Applicable 

## 2012-08-08 NOTE — H&P (View-Only) (Signed)
Patient is a 72 year old male who returns for followup today. He had amputation of his right first toe several weeks ago. This has not healed well. He is currently on IV vancomycin for osteomyelitis of the foot. When he was seen several weeks ago this did have some evidence of healing. He now reports that he has had increased drainage from the foot.  Past Medical History  Diagnosis Date  . Prostate cancer 2010  . PAD (peripheral artery disease)   . Gangrene of toe     dry  . Neuromuscular disorder     diabetic neuropathy  . Hypertension     takes Amlodipine daily and Metoprolol bid  . Hyperlipidemia     takes Pravastatin daily  . Myocardial infarction 2005  . Peripheral neuropathy   . Constipation     takes Miralax daily  . Hemorrhoids   . Hx of colonic polyps   . History of kidney stones   . Diabetes mellitus     Novolog and Levemir daily  . Coronary artery disease   . BPH (benign prostatic hypertrophy)   . Abnormality of gait   . Coronary atherosclerosis of native coronary artery   . Phlebitis and thrombophlebitis of other deep vessels of lower extremities   . Chronic kidney disease   . Hypertrophy of prostate without urinary obstruction and other lower urinary tract symptoms (LUTS)   . Disorder of bone and cartilage, unspecified   . Lower limb amputation, below knee   . Long term (current) use of anticoagulants   . Anemia 06/19/2012    Past Surgical History  Procedure Laterality Date  . Prostate surgery  2009    prostatectomy  . Knee cartilage surgery  at age 27    left knee  . Cea      Right  . Coronary artery bypass graft  2005    x 4  . Carotid endarterectomy  2005    right  . Cataract surgery  2011    bilateral  . Colonoscopy    . Cardiac catheterization  05/28/11  . Amputation  06/14/2011    Procedure: AMPUTATION DIGIT;  Surgeon: Sherren Kerns, MD;  Location: [facility] OR;  Service: Vascular;  Laterality: Left;  Amputation Left fifth toe  . Amputation  06/16/2011    Procedure: AMPUTATION BELOW KNEE;  Surgeon: Sherren Kerns, MD;  Location: [facility] OR;  Service: Vascular;  Laterality: Left;  . I&d extremity  01/31/2012    Procedure: IRRIGATION AND DEBRIDEMENT EXTREMITY;  Surgeon: Sherren Kerns, MD;  Location: [facility] OR;  Service: Vascular;  Laterality: Left;  I & D Left BKA   . Amputation Right 06/12/2012    Procedure: AMPUTATION DIGIT;  Surgeon: Sherren Kerns, MD;  Location: [facility] OR;  Service: Vascular;  Laterality: Right;  GREAT TOE    Current Outpatient Prescriptions on File Prior to Visit  Medication Sig Dispense Refill  . acetaminophen (TYLENOL) 325 MG tablet Take 2 tablets (650 mg total) by mouth every 6 (six) hours as needed for pain or fever.      [name] amLODipine (NORVASC) 5 MG tablet Take 5 mg by mouth daily.      [name] aspirin EC 81 MG tablet Take 81 mg by mouth every morning.       [name] atorvastatin (LIPITOR) 10 MG tablet Take 10 mg by mouth daily.      . ciprofloxacin (CIPRO) 500 MG tablet Take 1 tablet (500 mg total) by mouth daily. Until 07/31/12  0  . clopidogrel (PLAVIX) 75 MG tablet Take 1 tablet (75 mg total) by mouth daily.  30 tablet  11  . docusate sodium (COLACE) 100 MG capsule Take 100 mg by mouth 2 (two) times daily.       [name] doxazosin (CARDURA) 1 MG tablet Take 0.5 mg by mouth at bedtime.       . gabapentin (NEURONTIN) 800 MG tablet Take 800 mg by mouth 2 (two) times daily.      . insulin aspart (NOVOLOG FLEXPEN) 100 UNIT/ML injection Inject 0-5 Units into the skin 3 (three) times daily before meals. Sliding scale 0 units=below 150 anything greater 5 units      . insulin detemir (LEVEMIR) 100 UNIT/ML injection Inject 0.26 mLs (26 Units total) into the skin at bedtime.  10 mL  12  . levofloxacin (LEVAQUIN) 500 MG tablet Take 0.5 tablets (250 mg total) by mouth daily.  10 tablet  0  . oxyCODONE (OXY IR/ROXICODONE) 5 MG immediate release tablet Take 1 tablet (5 mg total) by mouth every 8 (eight) hours as needed (severe pain).  90 tablet  0  .  polyethylene glycol (MIRALAX / GLYCOLAX) packet Take 17 g by mouth daily.      . sodium chloride 0.9 % SOLN 150 mL with vancomycin 1000 MG SOLR 750 mg Inject 750 mg into the vein daily. Until 07/31/12       No current facility-administered medications on file prior to visit.    No Known Allergies   Review of systems: The patient states overall his renal function has been stable.  Physical exam:  Filed Vitals:   08/03/12 1253  BP: 151/75  Pulse: 80  Temp: 98.7 F (37.1 C)  TempSrc: Oral  Resp: 16  Height: 5\' 8"  (1.727 m)  Weight: 212 lb (96.163 kg)  SpO2: 98%   Right lower extremity: Right first toe amputation site has soft wet appearing tissue with some serous drainage the tip of the right second toe is ulcerated there is no significant erythema up into the foot or ankle.  Assessment: Poorly healing right first toe amputation with osteomyelitis not responding to IV antibiotics Plan: Right below-knee amputation July 29 risks benefits possible complications and procedure details of the amputation were explained to the patient today. This primarily include possibility of a nonhealing below-knee amputation requiring further amputations. He understands and agrees to proceed.  Fabienne Bruns, MD [facility] [place] Office: [phone] Pager: [phone]

## 2012-08-09 ENCOUNTER — Inpatient Hospital Stay (HOSPITAL_COMMUNITY): Payer: Medicare Other

## 2012-08-09 ENCOUNTER — Telehealth: Payer: Self-pay | Admitting: Vascular Surgery

## 2012-08-09 DIAGNOSIS — L98499 Non-pressure chronic ulcer of skin of other sites with unspecified severity: Secondary | ICD-10-CM

## 2012-08-09 DIAGNOSIS — Z452 Encounter for adjustment and management of vascular access device: Secondary | ICD-10-CM | POA: Diagnosis not present

## 2012-08-09 DIAGNOSIS — S88119A Complete traumatic amputation at level between knee and ankle, unspecified lower leg, initial encounter: Secondary | ICD-10-CM | POA: Diagnosis not present

## 2012-08-09 DIAGNOSIS — I739 Peripheral vascular disease, unspecified: Secondary | ICD-10-CM

## 2012-08-09 LAB — BASIC METABOLIC PANEL
CO2: 24 mEq/L (ref 19–32)
Chloride: 99 mEq/L (ref 96–112)
GFR calc non Af Amer: 23 mL/min — ABNORMAL LOW (ref >90)
Glucose, Bld: 286 mg/dL — ABNORMAL HIGH (ref 70–99)
Potassium: 5.8 mEq/L — ABNORMAL HIGH (ref 3.5–5.1)
Sodium: 132 mEq/L — ABNORMAL LOW (ref 135–145)

## 2012-08-09 LAB — CBC
HCT: 25.8 % — ABNORMAL LOW (ref 39.0–52.0)
Hemoglobin: 8.4 g/dL — ABNORMAL LOW (ref 13.0–17.0)
MCH: 29.6 pg (ref 26.0–34.0)
RBC: 2.84 MIL/uL — ABNORMAL LOW (ref 4.22–5.81)

## 2012-08-09 LAB — GLUCOSE, CAPILLARY
Glucose-Capillary: 167 mg/dL — ABNORMAL HIGH (ref 70–99)
Glucose-Capillary: 183 mg/dL — ABNORMAL HIGH (ref 70–99)

## 2012-08-09 MED ORDER — OXYCODONE HCL 5 MG PO TABS: 5.0000 mg | ORAL_TABLET | Freq: Three times a day (TID) | ORAL | Status: DC | PRN

## 2012-08-09 MED ORDER — INSULIN DETEMIR 100 UNIT/ML ~~LOC~~ SOLN
18.0000 [IU] | Freq: Every day | SUBCUTANEOUS | Status: DC
  Administered 2012-08-09 – 2012-08-11 (×3): 18 [IU] via SUBCUTANEOUS
  Filled 2012-08-09 (×4): qty 0.18

## 2012-08-09 MED ORDER — HYDROCODONE-ACETAMINOPHEN 5-325 MG PO TABS
1.0000 | ORAL_TABLET | ORAL | Status: DC | PRN
  Administered 2012-08-09: 2 via ORAL
  Administered 2012-08-10: 1 via ORAL
  Filled 2012-08-09: qty 2
  Filled 2012-08-09: qty 1
  Filled 2012-08-09: qty 2

## 2012-08-09 NOTE — Evaluation (Signed)
Physical Therapy Evaluation Patient Details Name: Aaron Long MRN: [id number] DOB: 1940-04-22 Today's Date: 08/09/2012 Time: 7829-5621 PT Time Calculation (min): 26 min  PT Assessment / Plan / Recommendation History of Present Illness  Aaron Long is a 72 y.o. right-handed male with history of CAD, diabetes mellitus peripheral neuropathy and history of left below-knee amputation with inpatient rehabilitation services 06/21/2011- 06/30/2011. Patient used left prosthesis prior to admission as well as her walker. Admitted 08/07/2012 with nonhealing wound gangrenous changes right foot and recent right first toe amputation. Recent x-rays right foot with early signs of osteomyelitis. Underwent right below knee amputation 08/08/2012 per Dr. Darrick Penna.   Clinical Impression  Pt demonstrates deficits in functional mobility as indicated below. Feel patient will benefit from continued skilled PT to address deficits as indicated. Patient has good family support and will benefit from continued inpatient rehabilitation (CIR) prior to discharge home. Will continue to see as indicated and progress activity as tolerated.    PT Assessment  Patient needs continued PT services    Follow Up Recommendations  CIR    Does the patient have the potential to tolerate intense rehabilitation    Yes      Equipment Recommendations  None recommended by PT    Recommendations for Other Services Rehab consult   Frequency Min 3X/week    Precautions / Restrictions Restrictions Weight Bearing Restrictions: No    Pertinent Vitals/Pain 4/10     Mobility  Bed Mobility Bed Mobility: Supine to Sit;Sitting - Scoot to Edge of Bed Supine to Sit: 3: Mod assist Sitting - Scoot to [place] of Bed: 4: Min assist Transfers Transfers: Risk manager: 1: +2 Total assist Anterior-Posterior Transfers: Patient Percentage: 60% Details for Transfer Assistance: Assisted with chuck  pad for scooting and VCs for hand placement and technique Ambulation/Gait Ambulation/Gait Assistance: Not tested (comment)        PT Diagnosis: Other (comment) (bilateral BKA)  PT Problem List: Decreased range of motion;Decreased activity tolerance;Decreased balance;Decreased mobility;Decreased skin integrity PT Treatment Interventions: DME instruction;Functional mobility training;Therapeutic activities;Therapeutic exercise;Balance training;Patient/family education;Wheelchair mobility training     PT Goals(Current goals can be found in the care plan section) Acute Rehab PT Goals Patient Stated Goal: none stated PT Goal Formulation: With patient/family Time For Goal Achievement: 08/23/12 Potential to Achieve Goals: Good  Visit Information  Last PT Received On: 08/09/12 Assistance Needed: +1 History of Present Illness: Aaron Long is a 72 y.o. right-handed male with history of CAD, diabetes mellitus peripheral neuropathy and history of left below-knee amputation with inpatient rehabilitation services 06/21/2011- 06/30/2011. Patient used left prosthesis prior to admission as well as her walker. Admitted 08/07/2012 with nonhealing wound gangrenous changes right foot and recent right first toe amputation. Recent x-rays right foot with early signs of osteomyelitis. Underwent right below knee amputation 08/08/2012 per Dr. Darrick Penna.       Prior Functioning  Home Living Family/patient expects to be discharged to:: Private residence Living Arrangements: Spouse/significant other Available Help at Discharge: Family;Available 24 hours/day Type of Home: Apartment Home Access: Level entry Home Layout: One level Prior Function Level of Independence: Needs assistance Comments: Required assist for curb Communication Communication: No difficulties Dominant Hand: Left    Cognition  Cognition Arousal/Alertness: Awake/alert Behavior During Therapy: WFL for tasks assessed/performed Overall  Cognitive Status: No family/caregiver present to determine baseline cognitive functioning    Extremity/Trunk Assessment Upper Extremity Assessment Upper Extremity Assessment: Defer to OT evaluation Lower Extremity Assessment Lower Extremity Assessment:  (4/5 hip flexion  and ROM)   Balance Static Sitting Balance Static Sitting - Balance Support: Bilateral upper extremity supported Static Sitting - Level of Assistance: 3: Mod assist Static Sitting - Comment/# of Minutes: 7 minutes  End of Session PT - End of Session Activity Tolerance: Patient tolerated treatment well Patient left: in chair;with call bell/phone within reach;with family/visitor present Nurse Communication: Mobility status  GP     Fabio Asa 08/09/2012, 1:28 PM Charlotte Crumb, PT DPT  [phone]

## 2012-08-09 NOTE — Progress Notes (Addendum)
Rehab Admissions Coordinator Note:  Patient was screened by Clois Dupes for appropriateness for an Inpatient Acute Rehab Consult. Rehab consulted completed this am. Roderic Palau will be following this [phone]     Clois Dupes 08/09/2012, 1:48 PM  I can be reached at [phone].

## 2012-08-09 NOTE — Progress Notes (Addendum)
[facility] Progress Note  08/09/2012 7:52 AM POD 1  Subjective:  States he is having phantom pain  Afebrile x 24hrs HR 50-100  150's-170's systolic 99% 2LO2NC  Filed Vitals:   08/09/12 0522  BP: 151/70  Pulse: 104  Temp: 98.4 F (36.9 C)  Resp: 16    Physical Exam: Incisions:  Bandage is c/d/i-drain is in tact.   CBC    Component Value Date/Time   WBC 10.0 08/09/2012 0450   RBC 2.84* 08/09/2012 0450   HGB 8.4* 08/09/2012 0450   HCT 25.8* 08/09/2012 0450   PLT 163 08/09/2012 0450   MCV 90.8 08/09/2012 0450   MCH 29.6 08/09/2012 0450   MCHC 32.6 08/09/2012 0450   RDW 16.4* 08/09/2012 0450   LYMPHSABS 1.6 07/11/2012 1556   MONOABS 0.6 07/11/2012 1556   EOSABS 0.4 07/11/2012 1556   BASOSABS 0.0 07/11/2012 1556    BMET    Component Value Date/Time   NA 132* 08/09/2012 0450   NA 143 05/23/2012 1250   K 5.8* 08/09/2012 0450   CL 99 08/09/2012 0450   CO2 24 08/09/2012 0450   GLUCOSE 286* 08/09/2012 0450   GLUCOSE 84 05/23/2012 1250   BUN 38* 08/09/2012 0450   BUN 39* 05/23/2012 1250   CREATININE 2.59* 08/09/2012 0450   CALCIUM 8.5 08/09/2012 0450   GFRNONAA 23* 08/09/2012 0450   GFRAA 27* 08/09/2012 0450    INR    Component Value Date/Time   INR 1.08 06/19/2012 1200     Intake/Output Summary (Last 24 hours) at 08/09/12 0752 Last data filed at 08/09/12 0300  Gross per 24 hour  Intake   1570 ml  Output   1115 ml  Net    455 ml     Assessment/Plan:  72 y.o. male is s/p right above knee amputation  POD 1  -pt doing well this am -mobilize pt to the chair tid with meal times-will take down dressing tomorrow. -JP drain with 65 cc out-he was very edematous intra operatively-will leave drain today.  Continue to monitor drain output. -Cr slightly worse today-still with good urine output. -discontinue IVF -pt is on neurontin-should help with his phantom pain.  Doreatha Massed, PA-C [facility] [phone] 08/09/2012 7:52 AM   Acute blood  loss anemia monitor for now transfuse for hemoglobin less than 8 Renal dysfunction near baseline but will need to monitor over next few days to make sure stable Pain control PT/OT rehab eval Change dressing tomorrow Type II diabetes with peripheral neuropathy and nephropathy stable.  Fabienne Bruns, MD [facility] [place] Office: [phone] Pager: [phone]

## 2012-08-09 NOTE — Progress Notes (Addendum)
Inpatient Diabetes Program Recommendations  AACE/ADA: New Consensus Statement on Inpatient Glycemic Control (2013)  Target Ranges:  Prepandial:   less than 140 mg/dL      Peak postprandial:   less than 180 mg/dL (1-2 hours)      Critically ill patients:  140 - 180 mg/dL     Results for DAYSHON, ROBACK (MRN [id number]) as of 08/09/2012 09:55  Ref. Range 08/08/2012 09:09 08/08/2012 12:25 08/08/2012 16:58 08/08/2012 21:01  Glucose-Capillary Latest Range: 70-99 mg/dL 409 (H) 811 (H) 914 (H) 248 (H)    Results for OMARIUS, GRANTHAM (MRN [id number]) as of 08/09/2012 09:55  Ref. Range 08/09/2012 06:28  Glucose-Capillary Latest Range: 70-99 mg/dL 086 (H)    Fasting glucose levels now elevated.  Patient POD#1 BKA.  Takes the following insulin at home per records: Levemir 18 units daily Novolog SSI tid  **Spoke with Doreatha Massed, PA with vascular services via OR RN.  Received telephone order to restart patient's home Levemir dose 18 units daily.  Will place order.  **Called RN, Delice Bison and alerted her to the new Levemir order (1010am)  Will follow. Ambrose Finland RN, MSN, CDE Diabetes Coordinator Inpatient Diabetes Program [phone]

## 2012-08-09 NOTE — Clinical Documentation Improvement (Signed)
THIS DOCUMENT IS NOT A PERMANENT PART OF THE MEDICAL RECORD  Please update your documentation with the medical record to reflect your response to this query. If you need help knowing how to do this please call [phone].  08/09/12   Dear Dr. Fields:/Associates,  In a better effort to capture your patient's severity of illness, reflect appropriate length of stay and utilization of resources, a review of the patient medical record has revealed the following indicators.    Based on your clinical judgment, please clarify and document in a progress note and/or discharge summary the clinical condition associated with the following supporting information:  In responding to this query please exercise your independent judgment.  The fact that a query is asked, does not imply that any particular answer is desired or expected.   Please clarify and specify Diabetes type, control, manifestations, and associated conditions.  _______Diabetes Type  1 or 2 _______Controlled or uncontrolled  Manifestations:  _______DM retinopathy  _______DM PVD _______DM neuropathy   _______DM nephropathy  Associated conditions: _______DM cellulitis _______DM gangrene _______DM gastroparesis _______DM hyperosmolarity state _______DM ketoacidosis with or without coma _______DM osteomyelitis _______DM skin ulcer  _______Other Condition  _______Cannot Clinically determine      Risk Factors: Diabetes mellitus noted per 7/24 H&P.   Lab:  7/29: Hgb A1c: 6.8 Mean plasma glucose:  148 Glucose, capillary: 276    You may use possible, probable, or suspect with inpatient documentation. possible, probable, suspected diagnoses MUST be documented at the time of discharge  Reviewed: No additional documentation provided.                                    Thank You,  Marciano Sequin,  Clinical Documentation Specialist: [phone] Health Information Management [place]

## 2012-08-09 NOTE — Progress Notes (Signed)
Occupational Therapy Evaluation Patient Details Name: Aaron Long MRN: [id number] DOB: [birth year] Today's Date: 08/09/2012 Time: 7829-5621 OT Time Calculation (min): 32 min  OT Assessment / Plan / Recommendation History of present illness Aaron Long is a 72 y.o. right-handed male with history of CAD, diabetes mellitus peripheral neuropathy and history of left below-knee amputation with inpatient rehabilitation services 06/21/2011- 06/30/2011. Patient used left prosthesis prior to admission as well as her walker. Admitted 08/07/2012 with nonhealing wound gangrenous changes right foot and recent right first toe amputation. Recent x-rays right foot with early signs of osteomyelitis. Underwent right below knee amputation 08/08/2012 per Dr. Darrick Penna.   Clinical Impression   PTA, pt mod I with ADL and mobility with use of prosthesis. Pt presents with functional decline and will benefit from skilled OT services to facilitate D/C to next venue due to below deficits.Continue to recommend comprehensive inpatient rehab (CIR) for post-acute therapy needs to return to PLOF. Very supportive family who can provide 24/7 care after D/C. Pt with post op confusion, which family states is normal for pt. Family states that pt is cognitively intact.     OT Assessment  Patient needs continued OT Services    Follow Up Recommendations  CIR    Barriers to Discharge      Equipment Recommendations  Tub/shower bench    Recommendations for Other Services Rehab consult  Frequency  Min 3X/week    Precautions / Restrictions Precautions Precautions: Fall Precaution Comments: LE precautions Restrictions Weight Bearing Restrictions: No   Pertinent Vitals/Pain 4 RLE. Leg elevated and properly positioned    ADL  Eating/Feeding: Set up Where Assessed - Eating/Feeding: Chair Grooming: Set up;Supervision/safety Where Assessed - Grooming: Supported sitting Upper Body Bathing: Minimal assistance Where  Assessed - Upper Body Bathing: Supported sitting Lower Body Bathing: Moderate assistance Where Assessed - Lower Body Bathing: Supported sitting;Lean right and/or left Upper Body Dressing: Minimal assistance Where Assessed - Upper Body Dressing: Supported sitting Lower Body Dressing: Moderate assistance Where Assessed - Lower Body Dressing: Supported sitting;Lean right and/or left Toilet Transfer: +2 Total assistance;Other (comment);Simulated (ant/post) Toilet Transfer: Patient Percentage: 60% Toileting - Clothing Manipulation and Hygiene: Simulated;Moderate assistance Transfers/Ambulation Related to ADLs: +2 ant/post transfer ADL Comments: continued vc due to apparent post op confusion    OT Diagnosis: Generalized weakness;Acute pain;Altered mental status  OT Problem List: Decreased strength;Decreased activity tolerance;Impaired balance (sitting and/or standing);Decreased safety awareness;Decreased knowledge of use of DME or AE;Decreased knowledge of precautions;Pain;Increased edema OT Treatment Interventions: Self-care/ADL training;Therapeutic exercise;Energy conservation;DME and/or AE instruction;Splinting;Therapeutic activities;Cognitive remediation/compensation;Patient/family education;Balance training   OT Goals(Current goals can be found in the care plan section) Acute Rehab OT Goals Patient Stated Goal: to go home OT Goal Formulation: With patient Time For Goal Achievement: 08/23/12 Potential to Achieve Goals: Good  Visit Information  Last OT Received On: 08/09/12 Assistance Needed: +1 PT/OT Co-Evaluation/Treatment: Yes History of Present Illness: Aaron Long is a 72 y.o. right-handed male with history of CAD, diabetes mellitus peripheral neuropathy and history of left below-knee amputation with inpatient rehabilitation services 06/21/2011- 06/30/2011. Patient used left prosthesis prior to admission as well as her walker. Admitted 08/07/2012 with nonhealing wound gangrenous  changes right foot and recent right first toe amputation. Recent x-rays right foot with early signs of osteomyelitis. Underwent right below knee amputation 08/08/2012 per Dr. Darrick Penna.       Prior Functioning     Home Living Family/patient expects to be discharged to:: Private residence Living Arrangements: Spouse/significant other Available Help at Discharge:  Family;Available 24 hours/day Type of Home: Apartment Home Access: Level entry Home Layout: One level Home Equipment: Cane - single point;Wheelchair - [employer] - 2 wheels;Shower seat;Grab bars - tub/shower Prior Function Level of Independence: Independent;Independent with assistive device(s) Gait / Transfers Assistance Needed: independent with prostesis and RW ADL's / Homemaking Assistance Needed: wife assists Comments: Required assist for curb Communication Communication: No difficulties Dominant Hand: Left         Vision/Perception     Cognition  Cognition Arousal/Alertness: Awake/alert Behavior During Therapy: WFL for tasks assessed/performed Overall Cognitive Status: Impaired/Different from baseline Area of Impairment: Orientation;Attention;Memory;Following commands;Safety/judgement;Awareness;Problem solving Orientation Level: Disoriented to;Place;Time;Situation Current Attention Level: Sustained Memory: Decreased recall of precautions;Decreased short-term memory Following Commands: Follows one step commands with increased time Safety/Judgement: Decreased awareness of safety;Decreased awareness of deficits Awareness: Intellectual Problem Solving: Slow processing;Difficulty sequencing;Requires verbal cues General Comments: daughter reports pt has diffficult time clearing cognitively after anesthesia    Extremity/Trunk Assessment Upper Extremity Assessment Upper Extremity Assessment: Overall WFL for tasks assessed Lower Extremity Assessment Lower Extremity Assessment: RLE deficits/detail;LLE  deficits/detail RLE Deficits / Details: BLE BKA Cervical / Trunk Assessment Cervical / Trunk Assessment: Normal     Mobility Bed Mobility Bed Mobility: Supine to Sit;Sitting - Scoot to Edge of Bed Supine to Sit: 3: Mod assist Sitting - Scoot to [place] of Bed: 4: Min assist Transfers Details for Transfer Assistance: Assisted with chuck pad for scooting and VCs for hand placement and technique     Exercise     Balance Static Sitting Balance Static Sitting - Balance Support: Bilateral upper extremity supported Static Sitting - Level of Assistance: 3: Mod assist Static Sitting - Comment/# of Minutes: 7 minutes   End of Session OT - End of Session Activity Tolerance: Patient tolerated treatment well Patient left: in chair;with call bell/phone within reach;with family/visitor present Nurse Communication: Mobility status;Other (comment);Precautions (transfer technique)  GO     Jarika Robben,HILLARY 08/09/2012, 2:28 PM [facility], OTR/L  [phone] 08/09/2012

## 2012-08-09 NOTE — Progress Notes (Addendum)
08/09/12 1500  PT Visit Information  Last PT Received On 08/09/12  Assistance Needed +1  History of Present Illness Aaron Long is a 72 y.o. right-handed male with history of CAD, diabetes mellitus peripheral neuropathy and history of left below-knee amputation with inpatient rehabilitation services 06/21/2011- 06/30/2011. Patient used left prosthesis prior to admission as well as her walker. Admitted 08/07/2012 with nonhealing wound gangrenous changes right foot and recent right first toe amputation. Recent x-rays right foot with early signs of osteomyelitis. Underwent right below knee amputation 08/08/2012 per Dr. Darrick Penna.  PT Time Calculation  PT Start Time 1524  PT Stop Time 1536  PT Time Calculation (min) 12 min  Subjective Data  Subjective Assist to get back to bed  Patient Stated Goal to go home  Precautions  Precautions Fall  Precaution Comments LE precautions  Restrictions  Weight Bearing Restrictions No  Cognition  Arousal/Alertness Awake/alert  Behavior During Therapy WFL for tasks assessed/performed  Overall Cognitive Status Impaired/Different from baseline  Area of Impairment Orientation;Attention;Memory;Following commands;Safety/judgement;Awareness;Problem solving  Orientation Level Disoriented to;Place;Time;Situation  Current Attention Level Sustained  Memory Decreased recall of precautions;Decreased short-term memory  Following Commands Follows one step commands with increased time  Safety/Judgement Decreased awareness of safety;Decreased awareness of deficits  Awareness Intellectual  Problem Solving Slow processing;Difficulty sequencing;Requires verbal cues  General Comments daughter reports pt has diffficult time clearing cognitively after anesthesia  Bed Mobility  Bed Mobility Sit to Supine  Sit to Supine 3: Mod assist  Transfers  Transfers Anterior-Posterior Transfer  Anterior-Posterior Transfer 1: +2 Total assist;To level surface (with chuck pad)   Anterior-Posterior Transfers: Patient Percentage 30%  Details for Transfer Assistance Pt required more assist to return to bed anteriorly. Used chuck pad for assist           Patient assisted back to bed, pt appears more confused with difficulty processing instructions. Patient may respond with increased time for cues. Will continue to see patient and progress activity as tolerated. Spoke with patient's wife, who confirmed this is normal post anesthesia. Wife will be bringing in patient prosthesis for prior BKA. Continue to recommend CIR  Charlotte Crumb, PT DPT  [phone] .

## 2012-08-09 NOTE — Telephone Encounter (Addendum)
Message copied by Fredrich Birks on Wed Aug 09, 2012  9:30 AM ------      Message from: Sharee Pimple      Created: Tue Aug 08, 2012 12:18 PM      Regarding: schedule                   ----- Message -----         From: Dara Lords, PA-C         Sent: 08/08/2012  12:17 PM           To: Sharee Pimple, CMA            S/p right BKA 08/08/12.  F/u with Dr. Darrick Penna in 4 weeks.            Thanks,      Lelon Mast ------  Spoke with pts wife to inform, dpm

## 2012-08-09 NOTE — Consult Note (Signed)
Physical Medicine and Rehabilitation Consult Reason for Consult: Right BKA/with history of left BKA Referring Physician: Dr. Darrick Penna   HPI: Aaron Long is a 72 y.o. right-handed male with history of CAD, diabetes mellitus peripheral neuropathy and history of left below-knee amputation with inpatient rehabilitation services 06/21/2011- 06/30/2011. Patient used left prosthesis prior to admission as well as her walker. Admitted 08/07/2012 with nonhealing wound gangrenous changes right foot and recent right first toe amputation. Recent x-rays right foot with early signs of osteomyelitis. Underwent right below knee amputation 08/08/2012 per Dr. Darrick Penna. Postoperative pain management. Maintained on subcutaneous Lovenox for DVT prophylaxis. Acute blood loss anemia 8.4 and monitored. Physical occupational therapy evaluations are pending. M.D. is requested physical medicine rehabilitation consult to consider inpatient rehabilitation services. Feels like right foot as hurting Has not been out of bed yet  Review of Systems  Gastrointestinal: Positive for constipation.  Genitourinary: Positive for urgency and frequency.  Musculoskeletal: Positive for joint pain.  Neurological: Positive for weakness.   Past Medical History  Diagnosis Date  . Prostate cancer 2010  . PAD (peripheral artery disease)   . Gangrene of toe     dry  . Neuromuscular disorder     diabetic neuropathy  . Hypertension     takes Amlodipine daily and Metoprolol bid  . Hyperlipidemia     takes Pravastatin daily  . Myocardial infarction 2005  . Peripheral neuropathy   . Constipation     takes Miralax daily  . Hemorrhoids   . Hx of colonic polyps   . History of kidney stones   . Diabetes mellitus     Novolog and Levemir daily  . Coronary artery disease   . BPH (benign prostatic hypertrophy)   . Abnormality of gait   . Coronary atherosclerosis of native coronary artery   . Phlebitis and thrombophlebitis of other deep  vessels of lower extremities   . Chronic kidney disease   . Hypertrophy of prostate without urinary obstruction and other lower urinary tract symptoms (LUTS)   . Disorder of bone and cartilage, unspecified   . Lower limb amputation, below knee   . Long term (current) use of anticoagulants   . Anemia 06/19/2012   Past Surgical History  Procedure Laterality Date  . Prostate surgery  2009    prostatectomy  . Knee cartilage surgery  at age 70    left knee  . Cea      Right  . Coronary artery bypass graft  2005    x 4  . Carotid endarterectomy  2005    right  . Cataract surgery  2011    bilateral  . Colonoscopy    . Cardiac catheterization  05/28/11  . Amputation  06/14/2011    Procedure: AMPUTATION DIGIT;  Surgeon: Sherren Kerns, MD;  Location: [facility] OR;  Service: Vascular;  Laterality: Left;  Amputation Left fifth toe  . Amputation  06/16/2011    Procedure: AMPUTATION BELOW KNEE;  Surgeon: Sherren Kerns, MD;  Location: [facility] OR;  Service: Vascular;  Laterality: Left;  . I&d extremity  01/31/2012    Procedure: IRRIGATION AND DEBRIDEMENT EXTREMITY;  Surgeon: Sherren Kerns, MD;  Location: [facility] OR;  Service: Vascular;  Laterality: Left;  I & D Left BKA   . Amputation Right 06/12/2012    Procedure: AMPUTATION DIGIT;  Surgeon: Sherren Kerns, MD;  Location: [facility] OR;  Service: Vascular;  Laterality: Right;  GREAT TOE  . Below knee leg amputation Right 08/08/2012  Dr Darrick Penna   Family History  Problem Relation Age of Onset  . Coronary artery disease Neg Hx   . Anesthesia problems Neg Hx   . Hypotension Neg Hx   . Malignant hyperthermia Neg Hx   . Pseudochol deficiency Neg Hx   . Hyperlipidemia Mother   . Hypertension Mother   . Cancer Mother   . Hyperlipidemia Father   . Hypertension Father   . Kidney disease Father   . Heart disease Sister   . Alzheimer's disease Sister   . Cancer Brother   . Other Sister    Social History:  reports that he quit smoking about 21 years ago. His  smoking use included Cigarettes. He has a 40 pack-year smoking history. He has never used smokeless tobacco. He reports that he does not drink alcohol or use illicit drugs. Allergies:  Allergies  Allergen Reactions  . Oxycodone Other (See Comments)    Hallucinations   Medications Prior to Admission  Medication Sig Dispense Refill  . acetaminophen (TYLENOL) 325 MG tablet Take 2 tablets (650 mg total) by mouth every 6 (six) hours as needed for pain or fever.      [name] amLODipine (NORVASC) 5 MG tablet Take 5 mg by mouth daily.      [name] aspirin EC 81 MG tablet Take 81 mg by mouth every morning.       [name] atorvastatin (LIPITOR) 10 MG tablet Take 10 mg by mouth daily.      . bisacodyl (DULCOLAX) 5 MG EC tablet Take 5 mg by mouth 2 (two) times daily.      . ciprofloxacin (CIPRO) 500 MG tablet Take 1 tablet (500 mg total) by mouth daily. Until 07/31/12    0  . doxazosin (CARDURA) 1 MG tablet Take 0.5 mg by mouth at bedtime.       . gabapentin (NEURONTIN) 800 MG tablet Take 800 mg by mouth 2 (two) times daily.      . insulin aspart (NOVOLOG FLEXPEN) 100 UNIT/ML injection Inject 0-5 Units into the skin 3 (three) times daily before meals. Sliding scale 0 units=below 150 anything greater 5 units      . insulin detemir (LEVEMIR) 100 UNIT/ML injection Inject 18 Units into the skin daily.      [name] levofloxacin (LEVAQUIN) 500 MG tablet Take 500 mg by mouth daily. 14 day course will be completed on 08/07/12      . oxyCODONE (OXY IR/ROXICODONE) 5 MG immediate release tablet Take 1 tablet (5 mg total) by mouth every 8 (eight) hours as needed (severe pain).  90 tablet  0  . polyethylene glycol (MIRALAX / GLYCOLAX) packet Take 17 g by mouth 2 (two) times daily.       . clopidogrel (PLAVIX) 75 MG tablet Take 1 tablet (75 mg total) by mouth daily.  30 tablet  11  . sodium chloride 0.9 % SOLN 150 mL with vancomycin 1000 MG SOLR 750 mg Inject 750 mg into the vein daily. Until 07/31/12        Home: Home Living Family/patient  expects to be discharged to:: Private residence Living Arrangements: Spouse/significant other  Functional History:   Functional Status:  Mobility:          ADL:    Cognition: Cognition Orientation Level: Oriented X4    Blood pressure 151/70, pulse 104, temperature 98.4 F (36.9 C), temperature source Oral, resp. rate 16, height 5\' 9"  (1.753 m), weight 96.163 kg (212 lb), SpO2 99.00%. Physical Exam  Vitals reviewed.  HENT:  Head: Normocephalic.  Eyes:  Pupils sluggish but reactive to light  Neck: Normal range of motion. Neck supple. No thyromegaly present.  Cardiovascular: Normal rate and regular rhythm.   Pulmonary/Chest: Effort normal and breath sounds normal. No respiratory distress.  Abdominal: Soft. Bowel sounds are normal. He exhibits no distension.  Neurological: He is alert.  Patient mildly confused but pleasant. He was able to participate with exam. He stated his name age as well as place. He was somewhat impulsive with numerous attempts to get out of bed  Skin:  Left BKA is healed. Right BKA with dressing and drain in place  Neuro:  Eyes without evidence of nystagmus  Tone is normal without evidence of spasticity Cerebellar exam shows no evidence of ataxia on finger nose finger or heel to shin testing No evidence of trunkal ataxia  Motor strength is 5/5 in bilateral deltoid, biceps, triceps, finger flexors and extensors, wrist flexors and extensors,5/5 L adn 4/5 R hip flexors,          Sensory exam is normal to  light touch in the upper and lower limbs   Cranial nerves II- Visual fields are intact to confrontation testing, no blurring of vision III- no evidence of ptosis, upward, downward and medial gaze intact IV- no vertical diplopia or head tilt V- no facial numbness or masseter weakness VI- no pupil abduction weakness VII- no facial droop, good lid closure VII- normal auditory acuity IX- no pharygeal weakness, gag nl X- no pharyngeal weakness, no  hoarseness XI- no trap or SCM weakness XII- no glossal weakness    Results for orders placed during the hospital encounter of 08/08/12 (from the past 24 hour(s))  BASIC METABOLIC PANEL     Status: Abnormal   Collection Time    08/08/12  8:48 AM      Result Value Range   Sodium 139  135 - 145 mEq/L   Potassium 5.2 (*) 3.5 - 5.1 mEq/L   Chloride 104  96 - 112 mEq/L   CO2 28  19 - 32 mEq/L   Glucose, Bld 152 (*) 70 - 99 mg/dL   BUN 37 (*) 6 - 23 mg/dL   Creatinine, Ser 1.61 (*) 0.50 - 1.35 mg/dL   Calcium 9.3  8.4 - 09.6 mg/dL   GFR calc non Af Amer 23 (*) >90 mL/min   GFR calc Af Amer 27 (*) >90 mL/min  CBC     Status: Abnormal   Collection Time    08/08/12  8:48 AM      Result Value Range   WBC 7.9  4.0 - 10.5 K/uL   RBC 3.59 (*) 4.22 - 5.81 MIL/uL   Hemoglobin 10.2 (*) 13.0 - 17.0 g/dL   HCT 04.5 (*) 40.9 - 81.1 %   MCV 91.4  78.0 - 100.0 fL   MCH 28.4  26.0 - 34.0 pg   MCHC 31.1  30.0 - 36.0 g/dL   RDW 91.4 (*) 78.2 - 95.6 %   Platelets 185  150 - 400 K/uL  GLUCOSE, CAPILLARY     Status: Abnormal   Collection Time    08/08/12  9:09 AM      Result Value Range   Glucose-Capillary 135 (*) 70 - 99 mg/dL  SURGICAL PCR SCREEN     Status: None   Collection Time    08/08/12  9:11 AM      Result Value Range   MRSA, PCR NEGATIVE  NEGATIVE   Staphylococcus aureus NEGATIVE  NEGATIVE  GLUCOSE, CAPILLARY     Status: Abnormal   Collection Time    08/08/12 12:25 PM      Result Value Range   Glucose-Capillary 191 (*) 70 - 99 mg/dL  CBC     Status: Abnormal   Collection Time    08/08/12  4:15 PM      Result Value Range   WBC 8.3  4.0 - 10.5 K/uL   RBC 3.06 (*) 4.22 - 5.81 MIL/uL   Hemoglobin 8.8 (*) 13.0 - 17.0 g/dL   HCT 45.4 (*) 09.8 - 11.9 %   MCV 91.5  78.0 - 100.0 fL   MCH 28.8  26.0 - 34.0 pg   MCHC 31.4  30.0 - 36.0 g/dL   RDW 14.7 (*) 82.9 - 56.2 %   Platelets 170  150 - 400 K/uL  CREATININE, SERUM     Status: Abnormal   Collection Time    08/08/12  4:15 PM       Result Value Range   Creatinine, Ser 2.51 (*) 0.50 - 1.35 mg/dL   GFR calc non Af Amer 24 (*) >90 mL/min   GFR calc Af Amer 28 (*) >90 mL/min  HEMOGLOBIN A1C     Status: Abnormal   Collection Time    08/08/12  4:15 PM      Result Value Range   Hemoglobin A1C 6.8 (*) <5.7 %   Mean Plasma Glucose 148 (*) <117 mg/dL  GLUCOSE, CAPILLARY     Status: Abnormal   Collection Time    08/08/12  4:58 PM      Result Value Range   Glucose-Capillary 276 (*) 70 - 99 mg/dL  GLUCOSE, CAPILLARY     Status: Abnormal   Collection Time    08/08/12  9:01 PM      Result Value Range   Glucose-Capillary 248 (*) 70 - 99 mg/dL  CBC     Status: Abnormal   Collection Time    08/09/12  4:50 AM      Result Value Range   WBC 10.0  4.0 - 10.5 K/uL   RBC 2.84 (*) 4.22 - 5.81 MIL/uL   Hemoglobin 8.4 (*) 13.0 - 17.0 g/dL   HCT 13.0 (*) 86.5 - 78.4 %   MCV 90.8  78.0 - 100.0 fL   MCH 29.6  26.0 - 34.0 pg   MCHC 32.6  30.0 - 36.0 g/dL   RDW 69.6 (*) 29.5 - 28.4 %   Platelets 163  150 - 400 K/uL   No results found.  Assessment/Plan: Diagnosis: new right BKA postoperative day #1, prior left BKA  1. Does the need for close, 24 hr/day medical supervision in concert with the patient's rehab needs make it unreasonable for this patient to be served in a less intensive setting? Potentially 2. Co-Morbidities requiring supervision/potential complications:  hypertension, diabetes, chronic kidney disease  3. Due to bladder management, bowel management, safety, skin/wound care, disease management, medication administration, pain management and patient education, does the patient require 24 hr/day rehab nursing? Potentially 4. Does the patient require coordinated care of a physician, rehab nurse, PT (1-2 hrs/day, I. days/week) and OT (1-2 hrs/day, 5 days/week) to address physical and functional deficits in the context of the above medical diagnosis(es)? Potentially Addressing deficits in the following areas: balance,  endurance, locomotion, strength, transferring, bowel/bladder control, bathing, dressing, grooming and toileting 5. Can the patient actively participate in an intensive therapy program of at least 3 hrs of therapy per day at  least 5 days per week? Potentially 6. The potential for patient to make measurable gains while on inpatient rehab is good 7. Anticipated functional outcomes upon discharge from inpatient rehab are Supervision with PT, Supervision with OT, NA with SLP. 8. Estimated rehab length of stay to reach the above functional goals is: 10-12 days 9. Does the patient have adequate social supports to accommodate these discharge functional goals? Potentially 10. Anticipated D/C setting: Home 11. Anticipated post D/C treatments: HH therapy 12. Overall Rehab/Functional Prognosis: good  RECOMMENDATIONS: This patient's condition is appropriate for continued rehabilitative care in the following setting: C. IR once able to tolerate PT OT Patient has agreed to participate in recommended program. Yes Note that insurance prior authorization may be required for reimbursement for recommended care.  Comment: Not ready yet    08/09/2012

## 2012-08-10 LAB — BASIC METABOLIC PANEL
BUN: 37 mg/dL — ABNORMAL HIGH (ref 6–23)
Chloride: 103 mEq/L (ref 96–112)
Creatinine, Ser: 2.78 mg/dL — ABNORMAL HIGH (ref 0.50–1.35)
GFR calc Af Amer: 25 mL/min — ABNORMAL LOW (ref >90)
GFR calc non Af Amer: 21 mL/min — ABNORMAL LOW (ref >90)

## 2012-08-10 LAB — CBC
HCT: 24.3 % — ABNORMAL LOW (ref 39.0–52.0)
MCH: 28.6 pg (ref 26.0–34.0)
MCHC: 32.1 g/dL (ref 30.0–36.0)
RDW: 17 % — ABNORMAL HIGH (ref 11.5–15.5)

## 2012-08-10 LAB — GLUCOSE, CAPILLARY
Glucose-Capillary: 100 mg/dL — ABNORMAL HIGH (ref 70–99)
Glucose-Capillary: 214 mg/dL — ABNORMAL HIGH (ref 70–99)

## 2012-08-10 MED FILL — Chlorhexidine Gluconate Liquid 4%: CUTANEOUS | Qty: 30 | Status: AC

## 2012-08-10 NOTE — Progress Notes (Signed)
Rehab admissions - Evaluated for possible admission.  I spoke with wife and she would like inpatient rehab prior to home with family.  Patient's nephew will assist after discharge home.  Noted patient confused.  I will check in am and see if he is ready for inpatient rehab admission in am.  Call me for questions.  #[phone]

## 2012-08-10 NOTE — Care Management Note (Unsigned)
    Page 1 of 1   08/10/2012     4:38:02 PM   CARE MANAGEMENT NOTE 08/10/2012  Patient:  Aaron Long, Aaron Long   Account Number:  [id number]  Date Initiated:  08/10/2012  Documentation initiated by:  Avarae Zwart  Subjective/Objective Assessment:   PT ADM S/P RT BKA ON 08/08/12.  PTA, PT LIVES WITH AND IS CARED FOR BY SPOUSE.     Action/Plan:   CIR CONSULT IN PROGRESS.  WILL CONSULT CSW FOR SNF BACKUP PLAN.  WILL FOLLOW PROGRESS.   Anticipated DC Date:  08/11/2012   Anticipated DC Plan:  IP REHAB FACILITY  In-house referral  Clinical Social Worker      DC Planning Services  CM consult      Choice offered to / List presented to:             Status of service:  In process, will continue to follow Medicare Important Message given?   (If response is "NO", the following Medicare IM given date fields will be blank) Date Medicare IM given:   Date Additional Medicare IM given:    Discharge Disposition:    Per UR Regulation:  Reviewed for med. necessity/level of care/duration of stay  If discussed at Long Length of Stay Meetings, dates discussed:    Comments:

## 2012-08-10 NOTE — Progress Notes (Signed)
Physical Therapy Treatment Patient Details Name: Aaron Long MRN: [id number] DOB: 02-03-1940 Today's Date: 08/10/2012 Time: 0926-0950 PT Time Calculation (min): 24 min  PT Assessment / Plan / Recommendation  History of Present Illness Aaron Long is a 72 y.o. right-handed male with history of CAD, diabetes mellitus peripheral neuropathy and history of left below-knee amputation with inpatient rehabilitation services 06/21/2011- 06/30/2011. Patient used left prosthesis prior to admission as well as her walker. Admitted 08/07/2012 with nonhealing wound gangrenous changes right foot and recent right first toe amputation. Recent x-rays right foot with early signs of osteomyelitis. Underwent right below knee amputation 08/08/2012 per Dr. Darrick Penna.   PT Comments   Pt very confused and disoriented today. Patient received saturated in urine, assisted patient with hygiene and changed bed linens. Patient then coached through some there-ex requiring max cues and changes in presentation to ensure patient participation. Patient tolerated well but was limited by pain. Attempted balance activities, however, patient still requires significant assist possibly compounded by cognition and confusion at this time. Will continue to work with patient and progress activity as tolerated.  Follow Up Recommendations  CIR           Equipment Recommendations  None recommended by PT    Recommendations for Other Services Rehab consult  Frequency Min 3X/week   Progress towards PT Goals Progress towards PT goals: Progressing toward goals  Plan Current plan remains appropriate    Precautions / Restrictions Precautions Precautions: Fall Precaution Comments: LE precautions Restrictions Weight Bearing Restrictions: No   Pertinent Vitals/Pain No value given    Mobility  Bed Mobility Bed Mobility: Supine to Sit;Sitting - Scoot to Edge of Bed Supine to Sit: 3: Mod assist Sitting - Scoot to Edge of Bed: 4: Min  assist Sit to Supine: 3: Mod assist Details for Bed Mobility Assistance: Patient unable to fully reach sitting position Transfers Transfers: Not assessed Details for Transfer Assistance: unsafe to perform without +2 assist today secondary to confusion    Exercises Amputee Exercises Quad Sets: AROM;Strengthening;5 reps Hip Flexion/Marching: AROM;Strengthening;5 reps Knee Flexion: AROM;Strengthening;5 reps Knee Extension: AROM;Strengthening;5 reps     PT Goals (current goals can now be found in the care plan section) Acute Rehab PT Goals Patient Stated Goal: to go home PT Goal Formulation: With patient/family Time For Goal Achievement: 08/23/12 Potential to Achieve Goals: Good  Visit Information  Last PT Received On: 08/10/12 Assistance Needed: +1 History of Present Illness: Aaron Long is a 72 y.o. right-handed male with history of CAD, diabetes mellitus peripheral neuropathy and history of left below-knee amputation with inpatient rehabilitation services 06/21/2011- 06/30/2011. Patient used left prosthesis prior to admission as well as her walker. Admitted 08/07/2012 with nonhealing wound gangrenous changes right foot and recent right first toe amputation. Recent x-rays right foot with early signs of osteomyelitis. Underwent right below knee amputation 08/08/2012 per Dr. Darrick Penna.    Subjective Data  Subjective: Pt confused said "we are getting ready to sail off, hurry up" disoriented x3 Patient Stated Goal: to go home   Cognition  Cognition Arousal/Alertness: Awake/alert Behavior During Therapy: WFL for tasks assessed/performed Overall Cognitive Status: Impaired/Different from baseline Area of Impairment: Orientation;Attention;Memory;Following commands;Safety/judgement;Awareness;Problem solving Orientation Level: Disoriented to;Place;Time;Situation Current Attention Level: Sustained Memory: Decreased recall of precautions;Decreased short-term memory Following Commands:  Follows one step commands with increased time Safety/Judgement: Decreased awareness of safety;Decreased awareness of deficits Awareness: Intellectual Problem Solving: Slow processing;Difficulty sequencing;Requires verbal cues General Comments: daughter reports pt has diffficult time clearing cognitively after  anesthesia    Balance  Static Sitting Balance Static Sitting - Balance Support: Bilateral upper extremity supported Static Sitting - Level of Assistance: 3: Mod assist Static Sitting - Comment/# of Minutes: patient still having difficulty with balance activities in sitting, unsure if cognition deficits are correlated to these deficits   End of Session PT - End of Session Activity Tolerance: Patient tolerated treatment well Patient left: in bed;with call bell/phone within reach; bed alarm set Nurse Communication: Mobility status   GP     Fabio Asa 08/10/2012, 12:33 PM Charlotte Crumb, PT DPT  [phone]

## 2012-08-10 NOTE — Clinical Documentation Improvement (Signed)
THIS DOCUMENT IS NOT A PERMANENT PART OF THE MEDICAL RECORD  Please update your documentation with the medical record to reflect your response to this query. If you need help knowing how to do this please call [phone].  08/10/12   Dear Dr. Fields:/Associates,  In a better effort to capture your patient's severity of illness, reflect appropriate length of stay and utilization of resources, a review of the patient medical record has revealed the following indicators.    Based on your clinical judgment, please clarify and document in a progress note and/or discharge summary the clinical condition associated with the following supporting information:  In responding to this query please exercise your independent judgment.  The fact that a query is asked, does not imply that any particular answer is desired or expected.    Possible Clinical Conditions?   _CKD Stage III - GFR 30-59 _CKD Stage IV - GFR 15-29 _CKD Stage V - GFR < 15 _ESRD (End Stage Renal Disease _Other condition _Cannot Clinically determine     Risk Factors:  Patient with a history of CKD noted per 7/24 H&P. CKD, creat stable noted per 7/31 progress note.   Lab:  Bun: 7/31:  37 7/30:  38  Creat: 7/31:  2.78 7/30:  2.59  GFR:  7/31:  25 7/30:  27   You may use possible, probable, or suspect with inpatient documentation. possible, probable, suspected diagnoses MUST be documented at the time of discharge  Reviewed: No additional documentation provided.                            Thank You,  Marciano Sequin,  Clinical Documentation Specialist: [phone] Health Information Management [place]

## 2012-08-10 NOTE — H&P (Signed)
Physical Medicine and Rehabilitation Admission H&P    No chief complaint on file. : HPI: Aaron Long is a 72 y.o. right-handed male with history of CAD, chronic renal insufficiency with baseline creatinine 2.6-2.9, diabetes mellitus peripheral neuropathy and history of left below-knee amputation with inpatient rehabilitation services 06/21/2011- 06/30/2011. Patient used left prosthesis prior to admission as well as her walker. Admitted 08/07/2012 with nonhealing wound gangrenous changes right foot and recent right first toe amputation. Recent x-rays right foot with early signs of osteomyelitis. Underwent right below knee amputation 08/08/2012 per Dr. Fields. Postoperative pain management. Bouts of confusion improved with limited narcotics. JP drain removed 08/10/2012. Maintained on subcutaneous Lovenox for DVT prophylaxis. Acute blood loss anemia 7.8 and monitored . Physical and  occupational therapy evaluations completed with recommendations of physical medicine rehabilitation consult to consider inpatient rehabilitation services. . Patient was felt to be a good candidate for inpatient rehabilitation services was admitted for comprehensive rehabilitation program  Review of Systems  Gastrointestinal: Positive for constipation.  Genitourinary: Positive for urgency and frequency.  Musculoskeletal: Positive for joint pain.  Neurological: Positive for weakness Remaining review of systems negative  Past Medical History  Diagnosis Date  . Prostate cancer 2010  . PAD (peripheral artery disease)   . Gangrene of toe     dry  . Neuromuscular disorder     diabetic neuropathy  . Hypertension     takes Amlodipine daily and Metoprolol bid  . Hyperlipidemia     takes Pravastatin daily  . Myocardial infarction 2005  . Peripheral neuropathy   . Constipation     takes Miralax daily  . Hemorrhoids   . Hx of colonic polyps   . History of kidney stones   . Diabetes mellitus     Novolog and Levemir  daily  . Coronary artery disease   . BPH (benign prostatic hypertrophy)   . Abnormality of gait   . Coronary atherosclerosis of native coronary artery   . Phlebitis and thrombophlebitis of other deep vessels of lower extremities   . Chronic kidney disease   . Hypertrophy of prostate without urinary obstruction and other lower urinary tract symptoms (LUTS)   . Disorder of bone and cartilage, unspecified   . Lower limb amputation, below knee   . Long term (current) use of anticoagulants   . Anemia 06/19/2012   Past Surgical History  Procedure Laterality Date  . Prostate surgery  2009    prostatectomy  . Knee cartilage surgery  at age 21    left knee  . Cea      Right  . Coronary artery bypass graft  2005    x 4  . Carotid endarterectomy  2005    right  . Cataract surgery  2011    bilateral  . Colonoscopy    . Cardiac catheterization  05/28/11  . Amputation  06/14/2011    Procedure: AMPUTATION DIGIT;  Surgeon: Charles E Fields, MD;  Location: [facility] OR;  Service: Vascular;  Laterality: Left;  Amputation Left fifth toe  . Amputation  06/16/2011    Procedure: AMPUTATION BELOW KNEE;  Surgeon: Charles E Fields, MD;  Location: [facility] OR;  Service: Vascular;  Laterality: Left;  . I&d extremity  01/31/2012    Procedure: IRRIGATION AND DEBRIDEMENT EXTREMITY;  Surgeon: Charles E Fields, MD;  Location: [facility] OR;  Service: Vascular;  Laterality: Left;  I & D Left BKA   . Amputation Right 06/12/2012    Procedure: AMPUTATION DIGIT;  Surgeon: Charles E   Fields, MD;  Location: [facility] OR;  Service: Vascular;  Laterality: Right;  GREAT TOE  . Below knee leg amputation Right 08/08/2012    Dr Fields   Family History  Problem Relation Age of Onset  . Coronary artery disease Neg Hx   . Anesthesia problems Neg Hx   . Hypotension Neg Hx   . Malignant hyperthermia Neg Hx   . Pseudochol deficiency Neg Hx   . Hyperlipidemia Mother   . Hypertension Mother   . Cancer Mother   . Hyperlipidemia Father   . Hypertension  Father   . Kidney disease Father   . Heart disease Sister   . Alzheimer's disease Sister   . Cancer Brother   . Other Sister    Social History:  reports that he quit smoking about 21 years ago. His smoking use included Cigarettes. He has a 40 pack-year smoking history. He has never used smokeless tobacco. He reports that he does not drink alcohol or use illicit drugs. Allergies:  Allergies  Allergen Reactions  . Oxycodone Other (See Comments)    Hallucinations   Medications Prior to Admission  Medication Sig Dispense Refill  . acetaminophen (TYLENOL) 325 MG tablet Take 2 tablets (650 mg total) by mouth every 6 (six) hours as needed for pain or fever.      . amLODipine (NORVASC) 5 MG tablet Take 5 mg by mouth daily.      . aspirin EC 81 MG tablet Take 81 mg by mouth every morning.       . atorvastatin (LIPITOR) 10 MG tablet Take 10 mg by mouth daily.      . bisacodyl (DULCOLAX) 5 MG EC tablet Take 5 mg by mouth 2 (two) times daily.      . doxazosin (CARDURA) 1 MG tablet Take 0.5 mg by mouth at bedtime.       . gabapentin (NEURONTIN) 800 MG tablet Take 800 mg by mouth 2 (two) times daily.      . insulin aspart (NOVOLOG FLEXPEN) 100 UNIT/ML injection Inject 0-5 Units into the skin 3 (three) times daily before meals. Sliding scale 0 units=below 150 anything greater 5 units      . insulin detemir (LEVEMIR) 100 UNIT/ML injection Inject 18 Units into the skin daily.      . polyethylene glycol (MIRALAX / GLYCOLAX) packet Take 17 g by mouth 2 (two) times daily.       . [DISCONTINUED] ciprofloxacin (CIPRO) 500 MG tablet Take 1 tablet (500 mg total) by mouth daily. Until 07/31/12    0  . [DISCONTINUED] levofloxacin (LEVAQUIN) 500 MG tablet Take 500 mg by mouth daily. 14 day course will be completed on 08/07/12      . [DISCONTINUED] oxyCODONE (OXY IR/ROXICODONE) 5 MG immediate release tablet Take 1 tablet (5 mg total) by mouth every 8 (eight) hours as needed (severe pain).  90 tablet  0  . clopidogrel  (PLAVIX) 75 MG tablet Take 1 tablet (75 mg total) by mouth daily.  30 tablet  11  . [DISCONTINUED] sodium chloride 0.9 % SOLN 150 mL with vancomycin 1000 MG SOLR 750 mg Inject 750 mg into the vein daily. Until 07/31/12        Home: Home Living Family/patient expects to be discharged to:: Private residence Living Arrangements: Spouse/significant other Available Help at Discharge: Family;Available 24 hours/day Type of Home: Apartment Home Access: Level entry Home Layout: One level Home Equipment: Cane - single point;Wheelchair - manual;Walker - 2 wheels;Shower seat;Grab bars - tub/shower     Functional History: Prior Function Comments: Required assist for curb  Functional Status:  Mobility: Bed Mobility Bed Mobility: Sit to Supine Supine to Sit: 3: Mod assist Sitting - Scoot to Edge of Bed: 4: Min assist Sit to Supine: 3: Mod assist Transfers Transfers: Anterior-Posterior Transfer Anterior-Posterior Transfer: 1: +2 Total assist;To level surface (with chuck pad) Anterior-Posterior Transfers: Patient Percentage: 30% Ambulation/Gait Ambulation/Gait Assistance: Not tested (comment)    ADL: ADL Eating/Feeding: Set up Where Assessed - Eating/Feeding: Chair Grooming: Set up;Supervision/safety Where Assessed - Grooming: Supported sitting Upper Body Bathing: Minimal assistance Where Assessed - Upper Body Bathing: Supported sitting Lower Body Bathing: Moderate assistance Where Assessed - Lower Body Bathing: Supported sitting;Lean right and/or left Upper Body Dressing: Minimal assistance Where Assessed - Upper Body Dressing: Supported sitting Lower Body Dressing: Moderate assistance Where Assessed - Lower Body Dressing: Supported sitting;Lean right and/or left Toilet Transfer: +2 Total assistance;Other (comment);Simulated (ant/post) Transfers/Ambulation Related to ADLs: +2 ant/post transfer ADL Comments: continued vc due to apparent post op confusion  Cognition: Cognition Overall  Cognitive Status: Impaired/Different from baseline Orientation Level: Oriented to person;Disoriented to time;Disoriented to situation Cognition Arousal/Alertness: Awake/alert Behavior During Therapy: WFL for tasks assessed/performed Overall Cognitive Status: Impaired/Different from baseline Area of Impairment: Orientation;Attention;Memory;Following commands;Safety/judgement;Awareness;Problem solving Orientation Level: Disoriented to;Place;Time;Situation Current Attention Level: Sustained Memory: Decreased recall of precautions;Decreased short-term memory Following Commands: Follows one step commands with increased time Safety/Judgement: Decreased awareness of safety;Decreased awareness of deficits Awareness: Intellectual Problem Solving: Slow processing;Difficulty sequencing;Requires verbal cues General Comments: daughter reports pt has diffficult time clearing cognitively after anesthesia  Physical Exam: Blood pressure 143/55, pulse 102, temperature 99.1 F (37.3 C), temperature source Oral, resp. rate 18, height 5' 9" (1.753 m), weight 96.163 kg (212 lb), SpO2 95.00%. Vitals reviewed.  HENT: oral mucosa pink and moist Head: Normocephalic. Dentition fair Eyes:  Pupils   reactive to light  Neck: Normal range of motion. Neck supple. No thyromegaly present.  Cardiovascular: Normal rate and regular rhythm. No murmur or rub Pulmonary/Chest: Effort normal and breath sounds normal. No respiratory distress. No wheezes rales or rhonchi Abdominal: Soft. Bowel sounds are normal. He exhibits no distension.  Neurological: He is alert.  Patient with mild STM deficits but improved from consultation exam. Oriented x 4.  He was able to participate with exam.  UE's 5/5. RLE is 2/5(pain). LLE is 4-5/5. No gross sensory changes.  Skin:  Left BKA is healed. Right BKA is intact with significant swelling. Very tender Neuro:  Eyes without evidence of nystagmus     Cranial nerves  II- Visual fields are  intact to confrontation testing, no blurring of vision  III- no evidence of ptosis, upward, downward and medial gaze intact  IV- no vertical diplopia or head tilt  V- no facial numbness or masseter weakness  VI- no pupil abduction weakness  VII- no facial droop, good lid closure  VII- normal auditory acuity  IX- no pharygeal weakness, gag nl  X- no pharyngeal weakness, no hoarseness  XI- no trap or SCM weakness  XII- no glossal weakness    Results for orders placed during the hospital encounter of 08/08/12 (from the past 48 hour(s))  BASIC METABOLIC PANEL     Status: Abnormal   Collection Time    08/08/12  8:48 AM      Result Value Range   Sodium 139  135 - 145 mEq/L   Potassium 5.2 (*) 3.5 - 5.1 mEq/L   Chloride 104  96 - 112 mEq/L   CO2 28    19 - 32 mEq/L   Glucose, Bld 152 (*) 70 - 99 mg/dL   BUN 37 (*) 6 - 23 mg/dL   Creatinine, Ser 2.61 (*) 0.50 - 1.35 mg/dL   Calcium 9.3  8.4 - 10.5 mg/dL   GFR calc non Af Amer 23 (*) >90 mL/min   GFR calc Af Amer 27 (*) >90 mL/min   Comment:            The eGFR has been calculated     using the CKD EPI equation.     This calculation has not been     validated in all clinical     situations.     eGFR's persistently     <90 mL/min signify     possible Chronic Kidney Disease.  CBC     Status: Abnormal   Collection Time    08/08/12  8:48 AM      Result Value Range   WBC 7.9  4.0 - 10.5 K/uL   RBC 3.59 (*) 4.22 - 5.81 MIL/uL   Hemoglobin 10.2 (*) 13.0 - 17.0 g/dL   HCT 32.8 (*) 39.0 - 52.0 %   MCV 91.4  78.0 - 100.0 fL   MCH 28.4  26.0 - 34.0 pg   MCHC 31.1  30.0 - 36.0 g/dL   RDW 16.3 (*) 11.5 - 15.5 %   Platelets 185  150 - 400 K/uL  GLUCOSE, CAPILLARY     Status: Abnormal   Collection Time    08/08/12  9:09 AM      Result Value Range   Glucose-Capillary 135 (*) 70 - 99 mg/dL  SURGICAL PCR SCREEN     Status: None   Collection Time    08/08/12  9:11 AM      Result Value Range   MRSA, PCR NEGATIVE  NEGATIVE    Staphylococcus aureus NEGATIVE  NEGATIVE   Comment:            The Xpert SA Assay (FDA     approved for NASAL specimens     in patients over 21 years of age),     is one component of     a comprehensive surveillance     program.  Test performance has     been validated by [facility] for patients greater     than or equal to 1 year old.     It is not intended     to diagnose infection nor to     guide or monitor treatment.  GLUCOSE, CAPILLARY     Status: Abnormal   Collection Time    08/08/12 12:25 PM      Result Value Range   Glucose-Capillary 191 (*) 70 - 99 mg/dL  CBC     Status: Abnormal   Collection Time    08/08/12  4:15 PM      Result Value Range   WBC 8.3  4.0 - 10.5 K/uL   RBC 3.06 (*) 4.22 - 5.81 MIL/uL   Hemoglobin 8.8 (*) 13.0 - 17.0 g/dL   HCT 28.0 (*) 39.0 - 52.0 %   MCV 91.5  78.0 - 100.0 fL   MCH 28.8  26.0 - 34.0 pg   MCHC 31.4  30.0 - 36.0 g/dL   RDW 16.2 (*) 11.5 - 15.5 %   Platelets 170  150 - 400 K/uL  CREATININE, SERUM     Status: Abnormal   Collection Time    08/08/12  4:15   PM      Result Value Range   Creatinine, Ser 2.51 (*) 0.50 - 1.35 mg/dL   GFR calc non Af Amer 24 (*) >90 mL/min   GFR calc Af Amer 28 (*) >90 mL/min   Comment:            The eGFR has been calculated     using the CKD EPI equation.     This calculation has not been     validated in all clinical     situations.     eGFR's persistently     <90 mL/min signify     possible Chronic Kidney Disease.  HEMOGLOBIN A1C     Status: Abnormal   Collection Time    08/08/12  4:15 PM      Result Value Range   Hemoglobin A1C 6.8 (*) <5.7 %   Comment: (NOTE)                                                                               According to the ADA Clinical Practice Recommendations for 2011, when     HbA1c is used as a screening test:      >=6.5%   Diagnostic of Diabetes Mellitus               (if abnormal result is confirmed)     5.7-6.4%   Increased risk of developing  Diabetes Mellitus     References:Diagnosis and Classification of Diabetes Mellitus,Diabetes     Care,2011,34(Suppl 1):S62-S69 and Standards of Medical Care in             Diabetes - 2011,Diabetes Care,2011,34 (Suppl 1):S11-S61.   Mean Plasma Glucose 148 (*) <117 mg/dL  GLUCOSE, CAPILLARY     Status: Abnormal   Collection Time    08/08/12  4:58 PM      Result Value Range   Glucose-Capillary 276 (*) 70 - 99 mg/dL  GLUCOSE, CAPILLARY     Status: Abnormal   Collection Time    08/08/12  9:01 PM      Result Value Range   Glucose-Capillary 248 (*) 70 - 99 mg/dL  BASIC METABOLIC PANEL     Status: Abnormal   Collection Time    08/09/12  4:50 AM      Result Value Range   Sodium 132 (*) 135 - 145 mEq/L   Comment: DELTA CHECK NOTED   Potassium 5.8 (*) 3.5 - 5.1 mEq/L   Chloride 99  96 - 112 mEq/L   CO2 24  19 - 32 mEq/L   Glucose, Bld 286 (*) 70 - 99 mg/dL   BUN 38 (*) 6 - 23 mg/dL   Creatinine, Ser 2.59 (*) 0.50 - 1.35 mg/dL   Calcium 8.5  8.4 - 10.5 mg/dL   GFR calc non Af Amer 23 (*) >90 mL/min   GFR calc Af Amer 27 (*) >90 mL/min   Comment:            The eGFR has been calculated     using the CKD EPI equation.     This calculation has not been     validated in all clinical     situations.       eGFR's persistently     <90 mL/min signify     possible Chronic Kidney Disease.  CBC     Status: Abnormal   Collection Time    08/09/12  4:50 AM      Result Value Range   WBC 10.0  4.0 - 10.5 K/uL   RBC 2.84 (*) 4.22 - 5.81 MIL/uL   Hemoglobin 8.4 (*) 13.0 - 17.0 g/dL   HCT 25.8 (*) 39.0 - 52.0 %   MCV 90.8  78.0 - 100.0 fL   MCH 29.6  26.0 - 34.0 pg   MCHC 32.6  30.0 - 36.0 g/dL   RDW 16.4 (*) 11.5 - 15.5 %   Platelets 163  150 - 400 K/uL  GLUCOSE, CAPILLARY     Status: Abnormal   Collection Time    08/09/12  6:28 AM      Result Value Range   Glucose-Capillary 226 (*) 70 - 99 mg/dL  GLUCOSE, CAPILLARY     Status: Abnormal   Collection Time    08/09/12 11:11 AM      Result  Value Range   Glucose-Capillary 167 (*) 70 - 99 mg/dL  GLUCOSE, CAPILLARY     Status: Abnormal   Collection Time    08/09/12  4:18 PM      Result Value Range   Glucose-Capillary 183 (*) 70 - 99 mg/dL   Comment 1 Notify RN    GLUCOSE, CAPILLARY     Status: None   Collection Time    08/09/12  9:11 PM      Result Value Range   Glucose-Capillary 84  70 - 99 mg/dL  BASIC METABOLIC PANEL     Status: Abnormal   Collection Time    08/10/12  5:45 AM      Result Value Range   Sodium 135  135 - 145 mEq/L   Potassium 5.1  3.5 - 5.1 mEq/L   Chloride 103  96 - 112 mEq/L   CO2 24  19 - 32 mEq/L   Glucose, Bld 96  70 - 99 mg/dL   BUN 37 (*) 6 - 23 mg/dL   Creatinine, Ser 2.78 (*) 0.50 - 1.35 mg/dL   Calcium 9.0  8.4 - 10.5 mg/dL   GFR calc non Af Amer 21 (*) >90 mL/min   GFR calc Af Amer 25 (*) >90 mL/min   Comment:            The eGFR has been calculated     using the CKD EPI equation.     This calculation has not been     validated in all clinical     situations.     eGFR's persistently     <90 mL/min signify     possible Chronic Kidney Disease.  CBC     Status: Abnormal   Collection Time    08/10/12  5:45 AM      Result Value Range   WBC 10.6 (*) 4.0 - 10.5 K/uL   RBC 2.73 (*) 4.22 - 5.81 MIL/uL   Hemoglobin 7.8 (*) 13.0 - 17.0 g/dL   HCT 24.3 (*) 39.0 - 52.0 %   MCV 89.0  78.0 - 100.0 fL   MCH 28.6  26.0 - 34.0 pg   MCHC 32.1  30.0 - 36.0 g/dL   RDW 17.0 (*) 11.5 - 15.5 %   Platelets 154  150 - 400 K/uL  GLUCOSE, CAPILLARY     Status: Abnormal   Collection Time      08/10/12  5:51 AM      Result Value Range   Glucose-Capillary 100 (*) 70 - 99 mg/dL   Ir Removal Tun Cv Cath W/o Fl  08/09/2012   *RADIOLOGY REPORT*  Clinical Data: PICC line access no longer required  RIGHT IJ SINGLE LUMEN TUNNELED POWER PICC LINE REMOVAL  Date:  08/09/2012 15:05:00  Radiologist:  M. Trevor Shick, M.D.  Medications:  1% lidocaine locally  Complications:  No immediate  PROCEDURE/FINDINGS:   Informed consent was obtained from the patient following explanation of the procedure, risks, benefits and alternatives. The patient understands, agrees and consents for the procedure. All questions were addressed.  A time out was performed.  Maximal barrier sterile technique utilized including caps, mask, sterile gowns, sterile gloves, large sterile drape, hand hygiene, and Chloroprep.  Under sterile conditions and local anesthesia, blunt and sharp dissection was utilized to remove the tunneled right IJ PICC line. Hemostasis obtained with manual compression.  No immediate complication.  Sterile dressing applied.  The patient tolerated the procedure well.  IMPRESSION: Successful right IJ tunneled PICC line removal   Original Report Authenticated By: M. Shick, M.D.    Post Admission Physician Evaluation: 1. Functional deficits secondary  to right BKA (hx of left BKA with prosthesis). 2. Patient is admitted to receive collaborative, interdisciplinary care between the physiatrist, rehab nursing staff, and therapy team. 3. Patient's level of medical complexity and substantial therapy needs in context of that medical necessity cannot be provided at a lesser intensity of care such as a SNF. 4. Patient has experienced substantial functional loss from his/her baseline which was documented above under the "Functional History" and "Functional Status" headings.  Judging by the patient's diagnosis, physical exam, and functional history, the patient has potential for functional progress which will result in measurable gains while on inpatient rehab.  These gains will be of substantial and practical use upon discharge  in facilitating mobility and self-care at the household level. 5. Physiatrist will provide 24 hour management of medical needs as well as oversight of the therapy plan/treatment and provide guidance as appropriate regarding the interaction of the two. 6. 24 hour rehab nursing will assist with bladder  management, bowel management, safety, skin/wound care, disease management, medication administration, pain management and patient education  and help integrate therapy concepts, techniques,education, etc. 7. PT will assess and treat for/with: Lower extremity strength, range of motion, stamina, balance, functional mobility, safety, adaptive techniques and equipment, pain mgt, pre-pros ed.   Goals are: mod I to supervision at a wheelchair level. 8. OT will assess and treat for/with: ADL's, functional mobility, safety, upper extremity strength, adaptive techniques and equipment, pain mgt, education.   Goals are: mod I to min assist for safety. 9. SLP will assess and treat for/with: n/a.  Goals are: n/a  (confusion is resolving and shouldn't be an ongoing issue). 10. Case Management and Social Worker will assess and treat for psychological issues and discharge planning. 11. Team conference will be held weekly to assess progress toward goals and to determine barriers to discharge. 12. Patient will receive at least 3 hours of therapy per day at least 5 days per week. 13. ELOS: 2 weeks       14. Prognosis:  excellent   Medical Problem List and Plan: 1. Right BKA 08/08/2012/history of left BKA  -has prosthesis with him   -ACE wrap, dressing for right residual limb 2. DVT Prophylaxis/Anticoagulation: Subcutaneous Lovenox. Monitor platelet counts 3. Pain Management: Hydrocodone prn. Monitor mental status (  see below) 4. Mood/postoperative confusion. Limited narcotics and monitor with increased mobility---much improved today 5. Neuropsych: This patient is capable of making decisions on his own behalf. 6. Acute blood loss anemia. Followup CBC and monitor for any bleeding episodes. Wound without drainage at present 7. Chronic renal insufficiency. Baseline creatinine 2.6-2.9. Followup chemistries 8. Diabetes mellitus with peripheral neuropathy. Hemoglobin A1c 6.8. Levemir 18 units daily. Check blood sugars a.c.  and at bedtime. Adjust regimen as needed moving forward.  9. Hypertension. Norvasc 5 mg daily, Cardura 0.5 mg each bedtime. Monitor with increased mobility 10. Hyperlipidemia. Lipitor 12. Coronary artery disease with history of myocardial infarction. Continue Plavix/aspirin. No complaints of chest pain   Roderick Sweezy T. Raymondo Garcialopez, MD, FAAPMR [facility]  08/10/2012 

## 2012-08-10 NOTE — Progress Notes (Addendum)
[facility]  Subjective  - Confused this morning thinks he is at [facility] wants to know if all this is legal, still with some pain  Objective 143/55 102 99.1 F (37.3 C) (Oral) 18 95%  Intake/Output Summary (Last 24 hours) at 08/10/12 0800 Last data filed at 08/10/12 7829  Gross per 24 hour  Intake    120 ml  Output    820 ml  Net   -700 ml   Right BKA healing Chest CTA Card RRR Neuro alert and oriented to person but not place or time no motor deficits  Assessment/Planning: Post op delirium most likely secondary to narcotic discontinue morphine and neurontin D/c drain To Rehab tomorrow if mental status improved CKD creatinine stable Diabetes II, hyperglycemia use sliding scale for now  Lindel Marcell E 08/10/2012 8:00 AM --  Laboratory Lab Results:  Recent Labs  08/09/12 0450 08/10/12 0545  WBC 10.0 10.6*  HGB 8.4* 7.8*  HCT 25.8* 24.3*  PLT 163 154   BMET  Recent Labs  08/08/12 0848 08/08/12 1615 08/09/12 0450  NA 139  --  132*  K 5.2*  --  5.8*  CL 104  --  99  CO2 28  --  24  GLUCOSE 152*  --  286*  BUN 37*  --  38*  CREATININE 2.61* 2.51* 2.59*  CALCIUM 9.3  --  8.5    COAG Lab Results  Component Value Date   INR 1.08 06/19/2012   INR 1.02 01/31/2012   INR 1.98* 01/28/2012   No results found for this basename: PTT

## 2012-08-11 ENCOUNTER — Inpatient Hospital Stay (HOSPITAL_COMMUNITY)
Admission: RE | Admit: 2012-08-11 | Discharge: 2012-08-21 | DRG: 945 | Disposition: A | Discharge status: Home Health | Payer: Medicare Other | Source: Intra-hospital | Attending: Physical Medicine & Rehabilitation | Admitting: Physical Medicine & Rehabilitation

## 2012-08-11 ENCOUNTER — Encounter (HOSPITAL_COMMUNITY): Payer: Self-pay | Admitting: Vascular Surgery

## 2012-08-11 DIAGNOSIS — Z87442 Personal history of urinary calculi: Secondary | ICD-10-CM

## 2012-08-11 DIAGNOSIS — N189 Chronic kidney disease, unspecified: Secondary | ICD-10-CM | POA: Diagnosis present

## 2012-08-11 DIAGNOSIS — F99 Mental disorder, not otherwise specified: Secondary | ICD-10-CM | POA: Diagnosis present

## 2012-08-11 DIAGNOSIS — I129 Hypertensive chronic kidney disease with stage 1 through stage 4 chronic kidney disease, or unspecified chronic kidney disease: Secondary | ICD-10-CM | POA: Diagnosis present

## 2012-08-11 DIAGNOSIS — F0280 Dementia in other diseases classified elsewhere without behavioral disturbance: Secondary | ICD-10-CM | POA: Diagnosis present

## 2012-08-11 DIAGNOSIS — Z7901 Long term (current) use of anticoagulants: Secondary | ICD-10-CM | POA: Diagnosis not present

## 2012-08-11 DIAGNOSIS — Z860100 Personal history of colon polyps, unspecified: Secondary | ICD-10-CM

## 2012-08-11 DIAGNOSIS — Z89519 Acquired absence of unspecified leg below knee: Secondary | ICD-10-CM

## 2012-08-11 DIAGNOSIS — L98499 Non-pressure chronic ulcer of skin of other sites with unspecified severity: Secondary | ICD-10-CM | POA: Diagnosis not present

## 2012-08-11 DIAGNOSIS — Z8679 Personal history of other diseases of the circulatory system: Secondary | ICD-10-CM | POA: Diagnosis not present

## 2012-08-11 DIAGNOSIS — Z87891 Personal history of nicotine dependence: Secondary | ICD-10-CM

## 2012-08-11 DIAGNOSIS — N186 End stage renal disease: Secondary | ICD-10-CM

## 2012-08-11 DIAGNOSIS — N184 Chronic kidney disease, stage 4 (severe): Secondary | ICD-10-CM | POA: Diagnosis not present

## 2012-08-11 DIAGNOSIS — E785 Hyperlipidemia, unspecified: Secondary | ICD-10-CM | POA: Diagnosis present

## 2012-08-11 DIAGNOSIS — Z48812 Encounter for surgical aftercare following surgery on the circulatory system: Secondary | ICD-10-CM

## 2012-08-11 DIAGNOSIS — Z5189 Encounter for other specified aftercare: Secondary | ICD-10-CM | POA: Diagnosis not present

## 2012-08-11 DIAGNOSIS — D631 Anemia in chronic kidney disease: Secondary | ICD-10-CM | POA: Diagnosis not present

## 2012-08-11 DIAGNOSIS — E1142 Type 2 diabetes mellitus with diabetic polyneuropathy: Secondary | ICD-10-CM | POA: Diagnosis present

## 2012-08-11 DIAGNOSIS — Z951 Presence of aortocoronary bypass graft: Secondary | ICD-10-CM | POA: Diagnosis not present

## 2012-08-11 DIAGNOSIS — D62 Acute posthemorrhagic anemia: Secondary | ICD-10-CM | POA: Diagnosis present

## 2012-08-11 DIAGNOSIS — S88119A Complete traumatic amputation at level between knee and ankle, unspecified lower leg, initial encounter: Secondary | ICD-10-CM | POA: Diagnosis not present

## 2012-08-11 DIAGNOSIS — N400 Enlarged prostate without lower urinary tract symptoms: Secondary | ICD-10-CM | POA: Diagnosis present

## 2012-08-11 DIAGNOSIS — N179 Acute kidney failure, unspecified: Secondary | ICD-10-CM | POA: Diagnosis not present

## 2012-08-11 DIAGNOSIS — I739 Peripheral vascular disease, unspecified: Secondary | ICD-10-CM | POA: Diagnosis present

## 2012-08-11 DIAGNOSIS — I252 Old myocardial infarction: Secondary | ICD-10-CM | POA: Diagnosis not present

## 2012-08-11 DIAGNOSIS — K5900 Constipation, unspecified: Secondary | ICD-10-CM | POA: Diagnosis present

## 2012-08-11 DIAGNOSIS — E118 Type 2 diabetes mellitus with unspecified complications: Secondary | ICD-10-CM | POA: Diagnosis not present

## 2012-08-11 DIAGNOSIS — I2510 Atherosclerotic heart disease of native coronary artery without angina pectoris: Secondary | ICD-10-CM | POA: Diagnosis present

## 2012-08-11 DIAGNOSIS — D649 Anemia, unspecified: Secondary | ICD-10-CM

## 2012-08-11 DIAGNOSIS — N039 Chronic nephritic syndrome with unspecified morphologic changes: Secondary | ICD-10-CM

## 2012-08-11 DIAGNOSIS — G309 Alzheimer's disease, unspecified: Secondary | ICD-10-CM | POA: Diagnosis present

## 2012-08-11 DIAGNOSIS — I10 Essential (primary) hypertension: Secondary | ICD-10-CM

## 2012-08-11 DIAGNOSIS — E1165 Type 2 diabetes mellitus with hyperglycemia: Secondary | ICD-10-CM

## 2012-08-11 LAB — CBC
HCT: 22.6 % — ABNORMAL LOW (ref 39.0–52.0)
Hemoglobin: 7.9 g/dL — ABNORMAL LOW (ref 13.0–17.0)
MCH: 28.7 pg (ref 26.0–34.0)
MCH: 28.6 pg (ref 26.0–34.0)
MCHC: 32.5 g/dL (ref 30.0–36.0)
MCV: 87.6 fL (ref 78.0–100.0)
Platelets: 148 10*3/uL — ABNORMAL LOW (ref 150–400)
RBC: 2.58 MIL/uL — ABNORMAL LOW (ref 4.22–5.81)
RDW: 16.6 % — ABNORMAL HIGH (ref 11.5–15.5)

## 2012-08-11 LAB — GLUCOSE, CAPILLARY: Glucose-Capillary: 175 mg/dL — ABNORMAL HIGH (ref 70–99)

## 2012-08-11 LAB — BASIC METABOLIC PANEL
BUN: 34 mg/dL — ABNORMAL HIGH (ref 6–23)
CO2: 24 mEq/L (ref 19–32)
Calcium: 9 mg/dL (ref 8.4–10.5)
Creatinine, Ser: 2.59 mg/dL — ABNORMAL HIGH (ref 0.50–1.35)
Glucose, Bld: 145 mg/dL — ABNORMAL HIGH (ref 70–99)

## 2012-08-11 LAB — CREATININE, SERUM: Creatinine, Ser: 2.56 mg/dL — ABNORMAL HIGH (ref 0.50–1.35)

## 2012-08-11 MED ORDER — AMLODIPINE BESYLATE 5 MG PO TABS
5.0000 mg | ORAL_TABLET | Freq: Every day | ORAL | Status: DC
  Administered 2012-08-12 – 2012-08-21 (×10): 5 mg via ORAL
  Filled 2012-08-11 (×11): qty 1

## 2012-08-11 MED ORDER — PANTOPRAZOLE SODIUM 40 MG PO TBEC
40.0000 mg | DELAYED_RELEASE_TABLET | Freq: Every day | ORAL | Status: DC
  Administered 2012-08-12 – 2012-08-21 (×10): 40 mg via ORAL
  Filled 2012-08-11 (×10): qty 1

## 2012-08-11 MED ORDER — POLYETHYLENE GLYCOL 3350 17 G PO PACK
17.0000 g | PACK | Freq: Two times a day (BID) | ORAL | Status: DC
  Administered 2012-08-11 – 2012-08-21 (×19): 17 g via ORAL
  Filled 2012-08-11 (×22): qty 1

## 2012-08-11 MED ORDER — ENOXAPARIN SODIUM 30 MG/0.3ML ~~LOC~~ SOLN
30.0000 mg | SUBCUTANEOUS | Status: DC
  Administered 2012-08-12 – 2012-08-20 (×9): 30 mg via SUBCUTANEOUS
  Filled 2012-08-11 (×10): qty 0.3

## 2012-08-11 MED ORDER — ATORVASTATIN CALCIUM 10 MG PO TABS
10.0000 mg | ORAL_TABLET | Freq: Every day | ORAL | Status: DC
  Administered 2012-08-11 – 2012-08-20 (×10): 10 mg via ORAL
  Filled 2012-08-11 (×11): qty 1

## 2012-08-11 MED ORDER — ONDANSETRON HCL 4 MG/2ML IJ SOLN: 4.0000 mg | Freq: Four times a day (QID) | INTRAMUSCULAR | Status: DC | PRN

## 2012-08-11 MED ORDER — ONDANSETRON HCL 4 MG PO TABS: 4.0000 mg | ORAL_TABLET | Freq: Four times a day (QID) | ORAL | Status: DC | PRN

## 2012-08-11 MED ORDER — HYDROCODONE-ACETAMINOPHEN 5-325 MG PO TABS
1.0000 | ORAL_TABLET | ORAL | Status: DC | PRN
  Administered 2012-08-11 – 2012-08-13 (×2): 1 via ORAL
  Administered 2012-08-14 (×2): 2 via ORAL
  Administered 2012-08-15 – 2012-08-17 (×3): 1 via ORAL
  Filled 2012-08-11 (×2): qty 1
  Filled 2012-08-11 (×2): qty 2
  Filled 2012-08-11 (×3): qty 1

## 2012-08-11 MED ORDER — ASPIRIN EC 81 MG PO TBEC
81.0000 mg | DELAYED_RELEASE_TABLET | Freq: Every day | ORAL | Status: DC
  Administered 2012-08-12 – 2012-08-21 (×10): 81 mg via ORAL
  Filled 2012-08-11 (×11): qty 1

## 2012-08-11 MED ORDER — CLOPIDOGREL BISULFATE 75 MG PO TABS
75.0000 mg | ORAL_TABLET | Freq: Every day | ORAL | Status: DC
  Administered 2012-08-12 – 2012-08-21 (×10): 75 mg via ORAL
  Filled 2012-08-11 (×12): qty 1

## 2012-08-11 MED ORDER — DOXAZOSIN MESYLATE 1 MG PO TABS
0.5000 mg | ORAL_TABLET | Freq: Every day | ORAL | Status: DC
  Administered 2012-08-11 – 2012-08-17 (×7): 0.5 mg via ORAL
  Filled 2012-08-11 (×9): qty 0.5

## 2012-08-11 MED ORDER — INSULIN ASPART 100 UNIT/ML ~~LOC~~ SOLN
0.0000 [IU] | Freq: Three times a day (TID) | SUBCUTANEOUS | Status: DC
  Administered 2012-08-11 – 2012-08-12 (×2): 3 [IU] via SUBCUTANEOUS
  Administered 2012-08-12 – 2012-08-13 (×3): 2 [IU] via SUBCUTANEOUS
  Administered 2012-08-14: 3 [IU] via SUBCUTANEOUS
  Administered 2012-08-14 – 2012-08-17 (×6): 2 [IU] via SUBCUTANEOUS
  Administered 2012-08-17: 3 [IU] via SUBCUTANEOUS
  Administered 2012-08-18: 5 [IU] via SUBCUTANEOUS
  Administered 2012-08-18 – 2012-08-20 (×4): 3 [IU] via SUBCUTANEOUS
  Administered 2012-08-20 (×2): 2 [IU] via SUBCUTANEOUS
  Administered 2012-08-21: 3 [IU] via SUBCUTANEOUS

## 2012-08-11 MED ORDER — ENOXAPARIN SODIUM 30 MG/0.3ML ~~LOC~~ SOLN: 30.0000 mg | SUBCUTANEOUS | Status: DC

## 2012-08-11 MED ORDER — SORBITOL 70 % SOLN: 30.0000 mL | Freq: Every day | Status: DC | PRN

## 2012-08-11 MED ORDER — INSULIN DETEMIR 100 UNIT/ML ~~LOC~~ SOLN
18.0000 [IU] | Freq: Every day | SUBCUTANEOUS | Status: DC
  Administered 2012-08-12 – 2012-08-21 (×10): 18 [IU] via SUBCUTANEOUS
  Filled 2012-08-11 (×10): qty 0.18

## 2012-08-11 MED ORDER — ACETAMINOPHEN 325 MG PO TABS
325.0000 mg | ORAL_TABLET | ORAL | Status: DC | PRN
  Administered 2012-08-15 – 2012-08-16 (×2): 650 mg via ORAL
  Filled 2012-08-11 (×3): qty 2

## 2012-08-11 NOTE — Discharge Summary (Signed)
[facility] Discharge Summary   Patient ID:  Aaron Long MRN: [id number] DOB/AGE: 1940-06-14 72 y.o.  Admit date: 08/08/2012 Discharge date: 08/11/2012 Date of Surgery: 08/08/2012 Surgeon: Surgeon(s): Sherren Kerns, MD  Admission Diagnosis: nonhealing surgical wound right foot  Discharge Diagnoses:  nonhealing surgical wound right foot  Secondary Diagnoses: Past Medical History  Diagnosis Date  . Prostate cancer 2010  . PAD (peripheral artery disease)   . Gangrene of toe     dry  . Neuromuscular disorder     diabetic neuropathy  . Hypertension     takes Amlodipine daily and Metoprolol bid  . Hyperlipidemia     takes Pravastatin daily  . Myocardial infarction 2005  . Peripheral neuropathy   . Constipation     takes Miralax daily  . Hemorrhoids   . Hx of colonic polyps   . History of kidney stones   . Diabetes mellitus     Novolog and Levemir daily  . Coronary artery disease   . BPH (benign prostatic hypertrophy)   . Abnormality of gait   . Coronary atherosclerosis of native coronary artery   . Phlebitis and thrombophlebitis of other deep vessels of lower extremities   . Chronic kidney disease   . Hypertrophy of prostate without urinary obstruction and other lower urinary tract symptoms (LUTS)   . Disorder of bone and cartilage, unspecified   . Lower limb amputation, below knee   . Long term (current) use of anticoagulants   . Anemia 06/19/2012    Procedure(s): AMPUTATION BELOW KNEE  Discharged Condition: good  HPI:  Patient is a 72 year old male who returns for followup today. He had amputation of his right first toe several weeks ago. This has not healed well. He is currently on IV vancomycin for osteomyelitis of the foot. When he was seen several weeks ago this did have some evidence of healing. He now reports that he has had increased drainage from the foot  Right below-knee amputation was done 08/08/2012.  He has had confusion and  complaints of phantom pain through out his post-op course.  He is stable and will go to in patient rehab today 08/11/2012.    Hospital Course:  Aaron Long is a 72 y.o. male is S/P Right Procedure(s): AMPUTATION BELOW KNEE Extubated: POD # 0 Physical exam: Right BKA healing  Chest CTA  Card RRR  Neuro alert and oriented to person but not place or time no motor deficits Post-op wounds clean, dry, intact or healing well Pt. Ambulating, voiding and taking PO diet without difficulty. Pt pain controlled with PO pain meds. Labs as below Complications:none  Consults:     Significant Diagnostic Studies: CBC Lab Results  Component Value Date   WBC 9.3 08/11/2012   HGB 7.4* 08/11/2012   HCT 22.6* 08/11/2012   MCV 87.6 08/11/2012   PLT 148* 08/11/2012    BMET    Component Value Date/Time   NA 137 08/11/2012 0540   NA 143 05/23/2012 1250   K 5.0 08/11/2012 0540   CL 103 08/11/2012 0540   CO2 24 08/11/2012 0540   GLUCOSE 145* 08/11/2012 0540   GLUCOSE 84 05/23/2012 1250   BUN 34* 08/11/2012 0540   BUN 39* 05/23/2012 1250   CREATININE 2.59* 08/11/2012 0540   CALCIUM 9.0 08/11/2012 0540   GFRNONAA 23* 08/11/2012 0540   GFRAA 27* 08/11/2012 0540   COAG Lab Results  Component Value Date   INR 1.08 06/19/2012   INR 1.02  01/31/2012   INR 1.98* 01/28/2012     Disposition:  Discharge to :Rehab Discharge Orders   Future Appointments Provider Department Dept Phone   08/22/2012 11:30 AM Oneal Grout, MD [facility] [phone]   09/07/2012 8:45 AM Sherren Kerns, MD [facility] [phone]   Future Orders Complete By Expires     Call MD for:  redness, tenderness, or signs of infection (pain, swelling, bleeding, redness, odor or green/yellow discharge around incision site)  As directed     Call MD for:  severe or increased pain, loss or decreased feeling  in affected limb(s)  As directed     Call MD for:  temperature >100.5  As directed     Lifting restrictions  As  directed     Comments:      No lifting for 4 weeks    Resume previous diet  As directed     may wash over wound with mild soap and water  As directed         Medication List    STOP taking these medications       ciprofloxacin 500 MG tablet  Commonly known as:  CIPRO     levofloxacin 500 MG tablet  Commonly known as:  LEVAQUIN     sodium chloride 0.9 % SOLN 150 mL with vancomycin 1000 MG SOLR 750 mg      TAKE these medications       acetaminophen 325 MG tablet  Commonly known as:  TYLENOL  Take 2 tablets (650 mg total) by mouth every 6 (six) hours as needed for pain or fever.     amLODipine 5 MG tablet  Commonly known as:  NORVASC  Take 5 mg by mouth daily.     aspirin EC 81 MG tablet  Take 81 mg by mouth every morning.     atorvastatin 10 MG tablet  Commonly known as:  LIPITOR  Take 10 mg by mouth daily.     bisacodyl 5 MG EC tablet  Commonly known as:  DULCOLAX  Take 5 mg by mouth 2 (two) times daily.     clopidogrel 75 MG tablet  Commonly known as:  PLAVIX  Take 1 tablet (75 mg total) by mouth daily.     doxazosin 1 MG tablet  Commonly known as:  CARDURA  Take 0.5 mg by mouth at bedtime.     gabapentin 800 MG tablet  Commonly known as:  NEURONTIN  Take 800 mg by mouth 2 (two) times daily.     insulin detemir 100 UNIT/ML injection  Commonly known as:  LEVEMIR  Inject 18 Units into the skin daily.     NOVOLOG FLEXPEN 100 UNIT/ML injection  Generic drug:  insulin aspart  Inject 0-5 Units into the skin 3 (three) times daily before meals. Sliding scale 0 units=below 150 anything greater 5 units     oxyCODONE 5 MG immediate release tablet  Commonly known as:  Oxy IR/ROXICODONE  Take 1 tablet (5 mg total) by mouth every 8 (eight) hours as needed (severe pain).     polyethylene glycol packet  Commonly known as:  MIRALAX / GLYCOLAX  Take 17 g by mouth 2 (two) times daily.       Verbal and written Discharge instructions given to the patient. Wound care  per Discharge AVS     Follow-up Information   Follow up with Sherren Kerns, MD In 4 weeks. (Office will call you to arrange your appt (  sent))    Contact information:   [address] [place] [address] [phone]       Signed: Clinton Gallant [facility] 08/11/2012, 9:36 AM

## 2012-08-11 NOTE — Interval H&P Note (Signed)
Aaron Long was admitted today to Inpatient Rehabilitation with the diagnosis of right BKA.  [name] history has been reviewed, patient examined, and there is no change in status.  Patient continues to be appropriate for intensive inpatient rehabilitation.  I have reviewed the patient's chart and labs.  Questions were answered to the patient's satisfaction.  Lalani Winkles T 08/11/2012, 5:58 PM

## 2012-08-11 NOTE — H&P (View-Only) (Signed)
Physical Medicine and Rehabilitation Admission H&P    No chief complaint on file. : HPI: Aaron Long is a 72 y.o. right-handed male with history of CAD, chronic renal insufficiency with baseline creatinine 2.6-2.9, diabetes mellitus peripheral neuropathy and history of left below-knee amputation with inpatient rehabilitation services 06/21/2011- 06/30/2011. Patient used left prosthesis prior to admission as well as her walker. Admitted 08/07/2012 with nonhealing wound gangrenous changes right foot and recent right first toe amputation. Recent x-rays right foot with early signs of osteomyelitis. Underwent right below knee amputation 08/08/2012 per Dr. Darrick Penna. Postoperative pain management. Bouts of confusion improved with limited narcotics. JP drain removed 08/10/2012. Maintained on subcutaneous Lovenox for DVT prophylaxis. Acute blood loss anemia 7.8 and monitored . Physical and  occupational therapy evaluations completed with recommendations of physical medicine rehabilitation consult to consider inpatient rehabilitation services. . Patient was felt to be a good candidate for inpatient rehabilitation services was admitted for comprehensive rehabilitation program  Review of Systems  Gastrointestinal: Positive for constipation.  Genitourinary: Positive for urgency and frequency.  Musculoskeletal: Positive for joint pain.  Neurological: Positive for weakness Remaining review of systems negative  Past Medical History  Diagnosis Date  . Prostate cancer 2010  . PAD (peripheral artery disease)   . Gangrene of toe     dry  . Neuromuscular disorder     diabetic neuropathy  . Hypertension     takes Amlodipine daily and Metoprolol bid  . Hyperlipidemia     takes Pravastatin daily  . Myocardial infarction 2005  . Peripheral neuropathy   . Constipation     takes Miralax daily  . Hemorrhoids   . Hx of colonic polyps   . History of kidney stones   . Diabetes mellitus     Novolog and Levemir  daily  . Coronary artery disease   . BPH (benign prostatic hypertrophy)   . Abnormality of gait   . Coronary atherosclerosis of native coronary artery   . Phlebitis and thrombophlebitis of other deep vessels of lower extremities   . Chronic kidney disease   . Hypertrophy of prostate without urinary obstruction and other lower urinary tract symptoms (LUTS)   . Disorder of bone and cartilage, unspecified   . Lower limb amputation, below knee   . Long term (current) use of anticoagulants   . Anemia 06/19/2012   Past Surgical History  Procedure Laterality Date  . Prostate surgery  2009    prostatectomy  . Knee cartilage surgery  at age 78    left knee  . Cea      Right  . Coronary artery bypass graft  2005    x 4  . Carotid endarterectomy  2005    right  . Cataract surgery  2011    bilateral  . Colonoscopy    . Cardiac catheterization  05/28/11  . Amputation  06/14/2011    Procedure: AMPUTATION DIGIT;  Surgeon: Sherren Kerns, MD;  Location: [facility] OR;  Service: Vascular;  Laterality: Left;  Amputation Left fifth toe  . Amputation  06/16/2011    Procedure: AMPUTATION BELOW KNEE;  Surgeon: Sherren Kerns, MD;  Location: [facility] OR;  Service: Vascular;  Laterality: Left;  . I&d extremity  01/31/2012    Procedure: IRRIGATION AND DEBRIDEMENT EXTREMITY;  Surgeon: Sherren Kerns, MD;  Location: [facility] OR;  Service: Vascular;  Laterality: Left;  I & D Left BKA   . Amputation Right 06/12/2012    Procedure: AMPUTATION DIGIT;  Surgeon: Janetta Hora  Fields, MD;  Location: [facility] OR;  Service: Vascular;  Laterality: Right;  GREAT TOE  . Below knee leg amputation Right 08/08/2012    Dr Darrick Penna   Family History  Problem Relation Age of Onset  . Coronary artery disease Neg Hx   . Anesthesia problems Neg Hx   . Hypotension Neg Hx   . Malignant hyperthermia Neg Hx   . Pseudochol deficiency Neg Hx   . Hyperlipidemia Mother   . Hypertension Mother   . Cancer Mother   . Hyperlipidemia Father   . Hypertension  Father   . Kidney disease Father   . Heart disease Sister   . Alzheimer's disease Sister   . Cancer Brother   . Other Sister    Social History:  reports that he quit smoking about 21 years ago. His smoking use included Cigarettes. He has a 40 pack-year smoking history. He has never used smokeless tobacco. He reports that he does not drink alcohol or use illicit drugs. Allergies:  Allergies  Allergen Reactions  . Oxycodone Other (See Comments)    Hallucinations   Medications Prior to Admission  Medication Sig Dispense Refill  . acetaminophen (TYLENOL) 325 MG tablet Take 2 tablets (650 mg total) by mouth every 6 (six) hours as needed for pain or fever.      [name] amLODipine (NORVASC) 5 MG tablet Take 5 mg by mouth daily.      [name] aspirin EC 81 MG tablet Take 81 mg by mouth every morning.       [name] atorvastatin (LIPITOR) 10 MG tablet Take 10 mg by mouth daily.      . bisacodyl (DULCOLAX) 5 MG EC tablet Take 5 mg by mouth 2 (two) times daily.      [name] doxazosin (CARDURA) 1 MG tablet Take 0.5 mg by mouth at bedtime.       . gabapentin (NEURONTIN) 800 MG tablet Take 800 mg by mouth 2 (two) times daily.      . insulin aspart (NOVOLOG FLEXPEN) 100 UNIT/ML injection Inject 0-5 Units into the skin 3 (three) times daily before meals. Sliding scale 0 units=below 150 anything greater 5 units      . insulin detemir (LEVEMIR) 100 UNIT/ML injection Inject 18 Units into the skin daily.      . polyethylene glycol (MIRALAX / GLYCOLAX) packet Take 17 g by mouth 2 (two) times daily.       . [DISCONTINUED] ciprofloxacin (CIPRO) 500 MG tablet Take 1 tablet (500 mg total) by mouth daily. Until 07/31/12    0  . [DISCONTINUED] levofloxacin (LEVAQUIN) 500 MG tablet Take 500 mg by mouth daily. 14 day course will be completed on 08/07/12      . [DISCONTINUED] oxyCODONE (OXY IR/ROXICODONE) 5 MG immediate release tablet Take 1 tablet (5 mg total) by mouth every 8 (eight) hours as needed (severe pain).  90 tablet  0  . clopidogrel  (PLAVIX) 75 MG tablet Take 1 tablet (75 mg total) by mouth daily.  30 tablet  11  . [DISCONTINUED] sodium chloride 0.9 % SOLN 150 mL with vancomycin 1000 MG SOLR 750 mg Inject 750 mg into the vein daily. Until 07/31/12        Home: Home Living Family/patient expects to be discharged to:: Private residence Living Arrangements: Spouse/significant other Available Help at Discharge: Family;Available 24 hours/day Type of Home: Apartment Home Access: Level entry Home Layout: One level Home Equipment: Cane - single point;Wheelchair - [employer] - 2 wheels;Shower seat;Grab bars - tub/shower  Functional History: Prior Function Comments: Required assist for curb  Functional Status:  Mobility: Bed Mobility Bed Mobility: Sit to Supine Supine to Sit: 3: Mod assist Sitting - Scoot to Edge of Bed: 4: Min assist Sit to Supine: 3: Mod assist Transfers Transfers: Risk manager: 1: +2 Total assist;To level surface (with chuck pad) Anterior-Posterior Transfers: Patient Percentage: 30% Ambulation/Gait Ambulation/Gait Assistance: Not tested (comment)    ADL: ADL Eating/Feeding: Set up Where Assessed - Eating/Feeding: Chair Grooming: Set up;Supervision/safety Where Assessed - Grooming: Supported sitting Upper Body Bathing: Minimal assistance Where Assessed - Upper Body Bathing: Supported sitting Lower Body Bathing: Moderate assistance Where Assessed - Lower Body Bathing: Supported sitting;Lean right and/or left Upper Body Dressing: Minimal assistance Where Assessed - Upper Body Dressing: Supported sitting Lower Body Dressing: Moderate assistance Where Assessed - Lower Body Dressing: Supported sitting;Lean right and/or left Toilet Transfer: +2 Total assistance;Other (comment);Simulated (ant/post) Transfers/Ambulation Related to ADLs: +2 ant/post transfer ADL Comments: continued vc due to apparent post op confusion  Cognition: Cognition Overall  Cognitive Status: Impaired/Different from baseline Orientation Level: Oriented to person;Disoriented to time;Disoriented to situation Cognition Arousal/Alertness: Awake/alert Behavior During Therapy: WFL for tasks assessed/performed Overall Cognitive Status: Impaired/Different from baseline Area of Impairment: Orientation;Attention;Memory;Following commands;Safety/judgement;Awareness;Problem solving Orientation Level: Disoriented to;Place;Time;Situation Current Attention Level: Sustained Memory: Decreased recall of precautions;Decreased short-term memory Following Commands: Follows one step commands with increased time Safety/Judgement: Decreased awareness of safety;Decreased awareness of deficits Awareness: Intellectual Problem Solving: Slow processing;Difficulty sequencing;Requires verbal cues General Comments: daughter reports pt has diffficult time clearing cognitively after anesthesia  Physical Exam: Blood pressure 143/55, pulse 102, temperature 99.1 F (37.3 C), temperature source Oral, resp. rate 18, height 5\' 9"  (1.753 m), weight 96.163 kg (212 lb), SpO2 95.00%. Vitals reviewed.  HENT: oral mucosa pink and moist Head: Normocephalic. Dentition fair Eyes:  Pupils   reactive to light  Neck: Normal range of motion. Neck supple. No thyromegaly present.  Cardiovascular: Normal rate and regular rhythm. No murmur or rub Pulmonary/Chest: Effort normal and breath sounds normal. No respiratory distress. No wheezes rales or rhonchi Abdominal: Soft. Bowel sounds are normal. He exhibits no distension.  Neurological: He is alert.  Patient with mild STM deficits but improved from consultation exam. Oriented x 4.  He was able to participate with exam.  UE's 5/5. RLE is 2/5(pain). LLE is 4-5/5. No gross sensory changes.  Skin:  Left BKA is healed. Right BKA is intact with significant swelling. Very tender Neuro:  Eyes without evidence of nystagmus     Cranial nerves  II- Visual fields are  intact to confrontation testing, no blurring of vision  III- no evidence of ptosis, upward, downward and medial gaze intact  IV- no vertical diplopia or head tilt  V- no facial numbness or masseter weakness  VI- no pupil abduction weakness  VII- no facial droop, good lid closure  VII- normal auditory acuity  IX- no pharygeal weakness, gag nl  X- no pharyngeal weakness, no hoarseness  XI- no trap or SCM weakness  XII- no glossal weakness    Results for orders placed during the hospital encounter of 08/08/12 (from the past 48 hour(s))  BASIC METABOLIC PANEL     Status: Abnormal   Collection Time    08/08/12  8:48 AM      Result Value Range   Sodium 139  135 - 145 mEq/L   Potassium 5.2 (*) 3.5 - 5.1 mEq/L   Chloride 104  96 - 112 mEq/L   CO2 28  19 - 32 mEq/L   Glucose, Bld 152 (*) 70 - 99 mg/dL   BUN 37 (*) 6 - 23 mg/dL   Creatinine, Ser 1.61 (*) 0.50 - 1.35 mg/dL   Calcium 9.3  8.4 - 09.6 mg/dL   GFR calc non Af Amer 23 (*) >90 mL/min   GFR calc Af Amer 27 (*) >90 mL/min   Comment:            The eGFR has been calculated     using the CKD EPI equation.     This calculation has not been     validated in all clinical     situations.     eGFR's persistently     <90 mL/min signify     possible Chronic Kidney Disease.  CBC     Status: Abnormal   Collection Time    08/08/12  8:48 AM      Result Value Range   WBC 7.9  4.0 - 10.5 K/uL   RBC 3.59 (*) 4.22 - 5.81 MIL/uL   Hemoglobin 10.2 (*) 13.0 - 17.0 g/dL   HCT 04.5 (*) 40.9 - 81.1 %   MCV 91.4  78.0 - 100.0 fL   MCH 28.4  26.0 - 34.0 pg   MCHC 31.1  30.0 - 36.0 g/dL   RDW 91.4 (*) 78.2 - 95.6 %   Platelets 185  150 - 400 K/uL  GLUCOSE, CAPILLARY     Status: Abnormal   Collection Time    08/08/12  9:09 AM      Result Value Range   Glucose-Capillary 135 (*) 70 - 99 mg/dL  SURGICAL PCR SCREEN     Status: None   Collection Time    08/08/12  9:11 AM      Result Value Range   MRSA, PCR NEGATIVE  NEGATIVE    Staphylococcus aureus NEGATIVE  NEGATIVE   Comment:            The Xpert SA Assay (FDA     approved for NASAL specimens     in patients over 44 years of age),     is one component of     a comprehensive surveillance     program.  Test performance has     been validated by [employer] for patients greater     than or equal to 22 year old.     It is not intended     to diagnose infection nor to     guide or monitor treatment.  GLUCOSE, CAPILLARY     Status: Abnormal   Collection Time    08/08/12 12:25 PM      Result Value Range   Glucose-Capillary 191 (*) 70 - 99 mg/dL  CBC     Status: Abnormal   Collection Time    08/08/12  4:15 PM      Result Value Range   WBC 8.3  4.0 - 10.5 K/uL   RBC 3.06 (*) 4.22 - 5.81 MIL/uL   Hemoglobin 8.8 (*) 13.0 - 17.0 g/dL   HCT 21.3 (*) 08.6 - 57.8 %   MCV 91.5  78.0 - 100.0 fL   MCH 28.8  26.0 - 34.0 pg   MCHC 31.4  30.0 - 36.0 g/dL   RDW 46.9 (*) 62.9 - 52.8 %   Platelets 170  150 - 400 K/uL  CREATININE, SERUM     Status: Abnormal   Collection Time    08/08/12  4:15  PM      Result Value Range   Creatinine, Ser 2.51 (*) 0.50 - 1.35 mg/dL   GFR calc non Af Amer 24 (*) >90 mL/min   GFR calc Af Amer 28 (*) >90 mL/min   Comment:            The eGFR has been calculated     using the CKD EPI equation.     This calculation has not been     validated in all clinical     situations.     eGFR's persistently     <90 mL/min signify     possible Chronic Kidney Disease.  HEMOGLOBIN A1C     Status: Abnormal   Collection Time    08/08/12  4:15 PM      Result Value Range   Hemoglobin A1C 6.8 (*) <5.7 %   Comment: (NOTE)                                                                               According to the ADA Clinical Practice Recommendations for 2011, when     HbA1c is used as a screening test:      >=6.5%   Diagnostic of Diabetes Mellitus               (if abnormal result is confirmed)     5.7-6.4%   Increased risk of developing  Diabetes Mellitus     References:Diagnosis and Classification of Diabetes Mellitus,Diabetes     Care,2011,34(Suppl 1):S62-S69 and Standards of Medical Care in             Diabetes - 2011,Diabetes Care,2011,34 (Suppl 1):S11-S61.   Mean Plasma Glucose 148 (*) <117 mg/dL  GLUCOSE, CAPILLARY     Status: Abnormal   Collection Time    08/08/12  4:58 PM      Result Value Range   Glucose-Capillary 276 (*) 70 - 99 mg/dL  GLUCOSE, CAPILLARY     Status: Abnormal   Collection Time    08/08/12  9:01 PM      Result Value Range   Glucose-Capillary 248 (*) 70 - 99 mg/dL  BASIC METABOLIC PANEL     Status: Abnormal   Collection Time    08/09/12  4:50 AM      Result Value Range   Sodium 132 (*) 135 - 145 mEq/L   Comment: DELTA CHECK NOTED   Potassium 5.8 (*) 3.5 - 5.1 mEq/L   Chloride 99  96 - 112 mEq/L   CO2 24  19 - 32 mEq/L   Glucose, Bld 286 (*) 70 - 99 mg/dL   BUN 38 (*) 6 - 23 mg/dL   Creatinine, Ser 1.61 (*) 0.50 - 1.35 mg/dL   Calcium 8.5  8.4 - 09.6 mg/dL   GFR calc non Af Amer 23 (*) >90 mL/min   GFR calc Af Amer 27 (*) >90 mL/min   Comment:            The eGFR has been calculated     using the CKD EPI equation.     This calculation has not been     validated in all clinical     situations.  eGFR's persistently     <90 mL/min signify     possible Chronic Kidney Disease.  CBC     Status: Abnormal   Collection Time    08/09/12  4:50 AM      Result Value Range   WBC 10.0  4.0 - 10.5 K/uL   RBC 2.84 (*) 4.22 - 5.81 MIL/uL   Hemoglobin 8.4 (*) 13.0 - 17.0 g/dL   HCT 91.4 (*) 78.2 - 95.6 %   MCV 90.8  78.0 - 100.0 fL   MCH 29.6  26.0 - 34.0 pg   MCHC 32.6  30.0 - 36.0 g/dL   RDW 21.3 (*) 08.6 - 57.8 %   Platelets 163  150 - 400 K/uL  GLUCOSE, CAPILLARY     Status: Abnormal   Collection Time    08/09/12  6:28 AM      Result Value Range   Glucose-Capillary 226 (*) 70 - 99 mg/dL  GLUCOSE, CAPILLARY     Status: Abnormal   Collection Time    08/09/12 11:11 AM      Result  Value Range   Glucose-Capillary 167 (*) 70 - 99 mg/dL  GLUCOSE, CAPILLARY     Status: Abnormal   Collection Time    08/09/12  4:18 PM      Result Value Range   Glucose-Capillary 183 (*) 70 - 99 mg/dL   Comment 1 Notify RN    GLUCOSE, CAPILLARY     Status: None   Collection Time    08/09/12  9:11 PM      Result Value Range   Glucose-Capillary 84  70 - 99 mg/dL  BASIC METABOLIC PANEL     Status: Abnormal   Collection Time    08/10/12  5:45 AM      Result Value Range   Sodium 135  135 - 145 mEq/L   Potassium 5.1  3.5 - 5.1 mEq/L   Chloride 103  96 - 112 mEq/L   CO2 24  19 - 32 mEq/L   Glucose, Bld 96  70 - 99 mg/dL   BUN 37 (*) 6 - 23 mg/dL   Creatinine, Ser 4.69 (*) 0.50 - 1.35 mg/dL   Calcium 9.0  8.4 - 62.9 mg/dL   GFR calc non Af Amer 21 (*) >90 mL/min   GFR calc Af Amer 25 (*) >90 mL/min   Comment:            The eGFR has been calculated     using the CKD EPI equation.     This calculation has not been     validated in all clinical     situations.     eGFR's persistently     <90 mL/min signify     possible Chronic Kidney Disease.  CBC     Status: Abnormal   Collection Time    08/10/12  5:45 AM      Result Value Range   WBC 10.6 (*) 4.0 - 10.5 K/uL   RBC 2.73 (*) 4.22 - 5.81 MIL/uL   Hemoglobin 7.8 (*) 13.0 - 17.0 g/dL   HCT 52.8 (*) 41.3 - 24.4 %   MCV 89.0  78.0 - 100.0 fL   MCH 28.6  26.0 - 34.0 pg   MCHC 32.1  30.0 - 36.0 g/dL   RDW 01.0 (*) 27.2 - 53.6 %   Platelets 154  150 - 400 K/uL  GLUCOSE, CAPILLARY     Status: Abnormal   Collection Time  08/10/12  5:51 AM      Result Value Range   Glucose-Capillary 100 (*) 70 - 99 mg/dL   Ir Removal Tun Cv Cath W/o Fl  08/09/2012   *RADIOLOGY REPORT*  Clinical Data: PICC line access no longer required  RIGHT IJ SINGLE LUMEN TUNNELED POWER PICC LINE REMOVAL  Date:  08/09/2012 15:05:00  Radiologist:  M. Ruel Favors, M.D.  Medications:  1% lidocaine locally  Complications:  No immediate  PROCEDURE/FINDINGS:   Informed consent was obtained from the patient following explanation of the procedure, risks, benefits and alternatives. The patient understands, agrees and consents for the procedure. All questions were addressed.  A time out was performed.  Maximal barrier sterile technique utilized including caps, mask, sterile gowns, sterile gloves, large sterile drape, hand hygiene, and Chloroprep.  Under sterile conditions and local anesthesia, blunt and sharp dissection was utilized to remove the tunneled right IJ PICC line. Hemostasis obtained with manual compression.  No immediate complication.  Sterile dressing applied.  The patient tolerated the procedure well.  IMPRESSION: Successful right IJ tunneled PICC line removal   Original Report Authenticated By: Judie Petit. Miles Costain, M.D.    Post Admission Physician Evaluation: 1. Functional deficits secondary  to right BKA (hx of left BKA with prosthesis). 2. Patient is admitted to receive collaborative, interdisciplinary care between the physiatrist, rehab nursing staff, and therapy team. 3. Patient's level of medical complexity and substantial therapy needs in context of that medical necessity cannot be provided at a lesser intensity of care such as a SNF. 4. Patient has experienced substantial functional loss from his/her baseline which was documented above under the "Functional History" and "Functional Status" headings.  Judging by the patient's diagnosis, physical exam, and functional history, the patient has potential for functional progress which will result in measurable gains while on inpatient rehab.  These gains will be of substantial and practical use upon discharge  in facilitating mobility and self-care at the household level. 5. Physiatrist will provide 24 hour management of medical needs as well as oversight of the therapy plan/treatment and provide guidance as appropriate regarding the interaction of the two. 6. 24 hour rehab nursing will assist with bladder  management, bowel management, safety, skin/wound care, disease management, medication administration, pain management and patient education  and help integrate therapy concepts, techniques,education, etc. 7. PT will assess and treat for/with: Lower extremity strength, range of motion, stamina, balance, functional mobility, safety, adaptive techniques and equipment, pain mgt, pre-pros ed.   Goals are: mod I to supervision at a wheelchair level. 8. OT will assess and treat for/with: ADL's, functional mobility, safety, upper extremity strength, adaptive techniques and equipment, pain mgt, education.   Goals are: mod I to min assist for safety. 9. SLP will assess and treat for/with: n/a.  Goals are: n/a  (confusion is resolving and shouldn't be an ongoing issue). 10. Case Management and Social Worker will assess and treat for psychological issues and discharge planning. 11. Team conference will be held weekly to assess progress toward goals and to determine barriers to discharge. 12. Patient will receive at least 3 hours of therapy per day at least 5 days per week. 13. ELOS: 2 weeks       14. Prognosis:  excellent   Medical Problem List and Plan: 1. Right BKA 08/08/2012/history of left BKA  -has prosthesis with him   -ACE wrap, dressing for right residual limb 2. DVT Prophylaxis/Anticoagulation: Subcutaneous Lovenox. Monitor platelet counts 3. Pain Management: Hydrocodone prn. Monitor mental status (  see below) 4. Mood/postoperative confusion. Limited narcotics and monitor with increased mobility---much improved today 5. Neuropsych: This patient is capable of making decisions on his own behalf. 6. Acute blood loss anemia. Followup CBC and monitor for any bleeding episodes. Wound without drainage at present 7. Chronic renal insufficiency. Baseline creatinine 2.6-2.9. Followup chemistries 8. Diabetes mellitus with peripheral neuropathy. Hemoglobin A1c 6.8. Levemir 18 units daily. Check blood sugars a.c.  and at bedtime. Adjust regimen as needed moving forward.  9. Hypertension. Norvasc 5 mg daily, Cardura 0.5 mg each bedtime. Monitor with increased mobility 10. Hyperlipidemia. Lipitor 12. Coronary artery disease with history of myocardial infarction. Continue Plavix/aspirin. No complaints of chest pain   [name], MD, [facility] Physical Medicine & Rehabilitation  08/10/2012

## 2012-08-11 NOTE — Plan of Care (Signed)
Overall Plan of Care [facility]) Patient Details Name: Aaron Long MRN: [id number] DOB: 24-Sep-1940  Diagnosis:  Right BKA  Co-morbidities: CRI, abla, dm, hypertension  Functional Problem List  Patient demonstrates impairments in the following areas: Balance, Bladder, Bowel, Cognition, Edema, Endurance, Medication Management, Motor, Pain and Sensory   Basic ADL's: grooming, bathing, dressing, toileting and transfers Advanced ADL's: none  Transfers:  bed mobility, bed to chair and car Locomotion:  ambulation, wheelchair mobility and stairs  Additional Impairments:  None  Anticipated Outcomes Item Anticipated Outcome  Eating/Swallowing    Basic self-care    SUPERVISION  Tolieting    SUPERVISION    Bowel/Bladder  Mod I for bowel and bladder  Transfers  Mod I bed to chair, chair to bed, S for car transfers  Locomotion  Mod I at w/c level, min Ax1 with rolling walker about 25 feet, up/down curb mod Ax1  Communication    Cognition    Pain  Pain at or below level of 5  Safety/Judgment    Other     Therapy Plan: PT Intensity: Minimum of 1-2 x/day ,45 to 90 minutes PT Frequency: 5 out of 7 days PT Duration Estimated Length of Stay: 10-14 days OT Intensity: Minimum of 1-2 x/day, 45 to 90 minutes OT Frequency: 5 out of 7 days OT Duration/Estimated Length of Stay: 2-weeks       Team Interventions: Item RN PT OT SLP SW TR Other  Self Care/Advanced ADL Retraining   X      Neuromuscular Re-Education  x       Therapeutic Activities  x X      UE/LE Strength Training/ROM x x X      UE/LE Coordination Activities  x X      Visual/Perceptual Remediation/Compensation         DME/Adaptive Equipment Instruction x x X      Therapeutic Exercise  x X      Balance/Vestibular Training  x X      Patient/Family Education x x X      Cognitive Remediation/Compensation   X      Functional Mobility Training x x X      Ambulation/Gait Training  x       Museum/gallery curator  x        Wheelchair Propulsion/Positioning  x X      Glass blower/designer         Bladder Management         Bowel Management         Disease Management/Prevention         Pain Management x x X      Medication Management x        Skin Care/Wound Management x  X      Splinting/Orthotics         Discharge Planning x x X      Psychosocial Support x  X                             Team Discharge Planning: Destination: PT-Home ,OT-  home , SLP-  Projected Follow-up: PT-Home health PT, OT-Home Health   , SLP-  Projected Equipment Needs: PT-Wheelchair (measurements);Rolling walker with 5" wheels, OT-tub bench, dropped arm 3n1  ,  SLP-  Patient/family involved in discharge planning: PT- Patient,  OT-Patient;Family member/caregiver, SLP-   MD ELOS: 10-14 days Medical Rehab Prognosis:  Excellent Assessment: The patient has been admitted for CIR therapies. The team will be addressing, functional mobility, strength, stamina, balance, safety, adaptive techniques/equipment, self-care, bowel and bladder mgt, patient and caregiver education, pre-pros ed, pain mgt. Goals have been set at supervision to modified independent.Aaron Oyster, MD, FAAPMR      See Team Conference Notes for weekly updates to the plan of care

## 2012-08-11 NOTE — Progress Notes (Signed)
Rehab admissions - I spoke with patient and his wife.  Patient still confused.  Wife now tells me that her brother will provide assistance to patient at home.  Wife had to stay with patient last night due to confusion.  Bed available on rehab and can admit today.  Call me for questions.  #[phone]

## 2012-08-11 NOTE — Progress Notes (Signed)
Physical Therapy Treatment Patient Details Name: Aaron Long MRN: [id number] DOB: 09/24/1940 Today's Date: 08/11/2012 Time: 7829-5621 PT Time Calculation (min): 33 min  PT Assessment / Plan / Recommendation  History of Present Illness Aaron Long is a 72 y.o. right-handed male with history of CAD, diabetes mellitus peripheral neuropathy and history of left below-knee amputation with inpatient rehabilitation services 06/21/2011- 06/30/2011. Patient used left prosthesis prior to admission as well as her walker. Admitted 08/07/2012 with nonhealing wound gangrenous changes right foot and recent right first toe amputation. Recent x-rays right foot with early signs of osteomyelitis. Underwent right below knee amputation 08/08/2012 per Dr. Darrick Penna.   PT Comments   Worked with patient this am; patient much more coherent and appears to be oriented and significantly less confused.  Patient followed instructions well and was able to transfer into chair. In speaking with patients spouse, she was concerned patient may be feeling depressed and bored. Asked patient if he would like to do there-ex in recliner while mobilizing throughout the hospital. Took patient via recliner out to main entrance atrium for a change of scenery. Patient was very Adult nurse. Patient performed ther-ex despite some increased pain in R residual limb.  Educated patient regarding positioning as well as pain management and desensitizations strategies.  Returned to room, patient wished to remain in chair for a while. Advised I would return later in the day when he was ready to get back to bed.  Will continue to see patient as indicated.   Follow Up Recommendations  CIR     Does the patient have the potential to tolerate intense rehabilitation   Yes     Equipment Recommendations  None recommended by PT    Recommendations for Other Services Rehab consult  Frequency Min 3X/week   Progress towards PT Goals Progress towards PT  goals: Progressing toward goals  Plan Current plan remains appropriate    Precautions / Restrictions Precautions Precautions: Fall Precaution Comments: LE precautions Restrictions Weight Bearing Restrictions: No   Pertinent Vitals/Pain 5/10    Mobility  Bed Mobility Bed Mobility: Supine to Sit;Sitting - Scoot to Edge of Bed Supine to Sit: 3: Mod assist Sitting - Scoot to Edge of Bed: 4: Min assist Details for Bed Mobility Assistance: Assist to elevate trunk from bed, assist for hip rotation Transfers Transfers: Counselling psychologist Transfer: 3: Mod assist Details for Transfer Assistance: assist with chuck pad and VCs, patient able to perform well with cues    Exercises Amputee Exercises Quad Sets: AROM;Strengthening;5 reps Straight Leg Raises: AROM;5 reps Chair Push Up: Strengthening;5 reps     PT Goals (current goals can now be found in the care plan section) Acute Rehab PT Goals Patient Stated Goal: to go home PT Goal Formulation: With patient/family Time For Goal Achievement: 08/23/12 Potential to Achieve Goals: Good  Visit Information  Last PT Received On: 08/11/12 Assistance Needed: +2 History of Present Illness: Aaron Long is a 72 y.o. right-handed male with history of CAD, diabetes mellitus peripheral neuropathy and history of left below-knee amputation with inpatient rehabilitation services 06/21/2011- 06/30/2011. Patient used left prosthesis prior to admission as well as her walker. Admitted 08/07/2012 with nonhealing wound gangrenous changes right foot and recent right first toe amputation. Recent x-rays right foot with early signs of osteomyelitis. Underwent right below knee amputation 08/08/2012 per Dr. Darrick Penna.    Subjective Data  Subjective: Appears confusion has cleared, pt apologetic for his confusion Patient Stated Goal: to go home   Cognition  Cognition Arousal/Alertness: Awake/alert Behavior During Therapy: WFL for  tasks assessed/performed Overall Cognitive Status: Within Functional Limits for tasks assessed General Comments: much improved over prior sessions, has no recall of prior intractuions and therapy sessions secondary to prior confusion. Pt very apologetic for having been confused.    Balance  Static Sitting Balance Static Sitting - Balance Support: Bilateral upper extremity supported Static Sitting - Level of Assistance: 4: Min assist Static Sitting - Comment/# of Minutes: assist for balance and stability, VCs for hand placements and self support.  End of Session PT - End of Session Activity Tolerance: Patient tolerated treatment well Patient left: in chair;with call bell/phone within reach;with family/visitor present Nurse Communication: Mobility status   GP     Fabio Asa 08/11/2012, 1:56 PM Charlotte Crumb, PT DPT  [phone]

## 2012-08-11 NOTE — Progress Notes (Addendum)
[facility]  Subjective  - patient complains of phantom pain in the right lower extremity.   Objective 141/66 93 99.4 F (37.4 C) (Oral) 18 97%  Intake/Output Summary (Last 24 hours) at 08/11/12 1016 Last data filed at 08/11/12 0730  Gross per 24 hour  Intake    240 ml  Output    300 ml  Net    -60 ml    Incision is clean and dry, no erythema or active drainage. Open to air Alert and oriented to person   Assessment/Planning: Post-op right below knee amputation Discharge to CIR today    Cl[facility] 08/11/2012 10:16 AM --  Laboratory Lab Results:  Recent Labs  08/10/12 0545 08/11/12 0540  WBC 10.6* 9.3  HGB 7.8* 7.4*  HCT 24.3* 22.6*  PLT 154 148*   BMET  Recent Labs  08/10/12 0545 08/11/12 0540  NA 135 137  K 5.1 5.0  CL 103 103  CO2 24 24  GLUCOSE 96 145*  BUN 37* 34*  CREATININE 2.78* 2.59*  CALCIUM 9.0 9.0    COAG Lab Results  Component Value Date   INR 1.08 06/19/2012   INR 1.02 01/31/2012   INR 1.98* 01/28/2012   No results found for this basename: PTT      Mental status improved.  Right BKA healing  To Rehab today  Fabienne Bruns, MD [facility] Office: [phone] Pager: [phone]

## 2012-08-11 NOTE — Progress Notes (Signed)
Physical Therapy Treatment Patient Details Name: ASHYR HEDGEPATH MRN: [id number] DOB: 08/03/1940 Today's Date: 08/11/2012 Time: 1222-1238 PT Time Calculation (min): 16 min   08/11/12 1400  PT Visit Information  Last PT Received On 08/11/12  Assistance Needed +2  PT Time Calculation  PT Start Time 1222  PT Stop Time 1238  PT Time Calculation (min) 16 min  Subjective Data  Subjective I enjoyed being able to take our toor and see other people outside of this room  Patient Stated Goal to go home  Precautions  Precautions Fall  Precaution Comments LE precautions  Restrictions  Weight Bearing Restrictions No  Cognition  Arousal/Alertness Awake/alert  Behavior During Therapy WFL for tasks assessed/performed  Overall Cognitive Status Within Functional Limits for tasks assessed  General Comments Patient is oriented x3 and remembers details of earlier session as well as conversations about him going up to rehab today, patient was also able to recall being in rehab and the nurses he worked with last time he was there.  Bed Mobility  Bed Mobility Rolling Right;Rolling Left;Sit to Supine;Scooting to [name]  Rolling Right 4: Min assist  Rolling Left 4: Min assist  Supine to Sit 3: Mod assist  Sitting - Scoot to Edge of Bed 1: +2 Total assist  Sitting - Scoot to Edge of Bed: Patient Percentage 60%  Details for Bed Mobility Assistance Assist for positioning   Transfers  Transfers Anterior-Posterior Transfer (performed in reverse from chair to bed)  Anterior-Posterior Transfer 3: Mod assist  Details for Transfer Assistance patient able to follow cues to elevate trunk and advance forward. Once patient reached the bed, Pt required assist to rotate trunk and move to sidelying position. Minimal assist was used to help scoot in the bed (heavy reliance on rails) then patient was able to roll side to side to allow comfortable position with pads underneath. Chuck pad used with +2 assist to slide pt up in  the bed.   Static Sitting Balance  Static Sitting - Balance Support Bilateral upper extremity supported  Static Sitting - Level of Assistance 4: Min assist  PT - End of Session  Activity Tolerance Patient tolerated treatment well  Patient left in bed;with call bell/phone within reach  Nurse Communication Mobility status  PT - Assessment/Plan  PT Plan Current plan remains appropriate  PT Frequency Min 3X/week  Recommendations for Other Services Rehab consult  Follow Up Recommendations CIR  PT equipment None recommended by PT  PT Goal Progression  Progress towards PT goals Progressing toward goals  Acute Rehab PT Goals  PT Goal Formulation With patient/family  Time For Goal Achievement 08/23/12  Potential to Achieve Goals Good              Charlotte Crumb, PT DPT  [phone]

## 2012-08-11 NOTE — PMR Pre-admission (Signed)
PMR Admission Coordinator Pre-Admission Assessment  Patient: Aaron Long is an 72 y.o., male MRN: [id number] DOB: November 12, 1940 Height: 5\' 9"  (175.3 cm) Weight: 96.163 kg (212 lb)              Insurance Information HMO:     PPO:       PCP:       IPA:       80/20:       OTHER:   PRIMARY: Medicare A/B      Policy#: [id number]      Subscriber: Aaron Long CM Name:        Phone#:       Fax#:   Pre-Cert#:        Employer: Retired Benefits:  Phone #:       Name: Armed forces technical officer. Date: 09/11/12     Deduct: $1216      Out of Pocket Max: none      Life Max: unlimited CIR: 100%      SNF: 5 full days and 80 copay SNF days available.  LBD 07/10/12 Outpatient: 80%     Co-Pay: 20% Home Health: 100%      Co-Pay: none DME: 80%     Co-Pay: 20% Providers: patient's choice  SECONDARY: BCBS out of state      Policy#: [id number]      Subscriber: Aaron Long CM Name:        Phone#:       Fax#:   Pre-Cert#:        Employer: Retired Benefits:  Phone #: [phone]     Name:   Eff. Date:       Deduct:        Out of Pocket Max:        Life Max:   CIR:        SNF:   Outpatient:       Co-Pay:   Home Health:        Co-Pay:   DME:       Co-Pay:    Emergency Contact Information Contact Information   Name Relation Home Work Mobile   Aaron Long Spouse [phone]  [phone]   Aaron Long, Aaron Long [phone]  [phone]   Aaron Long, Aaron Long Daughter [phone]  [phone]     Current Medical History  Patient Admitting Diagnosis:  R BKA, old L BKA  History of Present Illness: A 72 y.o. right-handed male with history of CAD, chronic renal insufficiency with baseline creatinine 2.6-2.9, diabetes mellitus peripheral neuropathy and history of left below-knee amputation with inpatient rehabilitation services 06/21/2011- 06/30/2011. Patient used left prosthesis prior to admission as well as her walker. Admitted 08/07/2012 with nonhealing wound gangrenous changes right foot and recent right first  toe amputation. Recent x-rays right foot with early signs of osteomyelitis. Underwent right below knee amputation 08/08/2012 per Dr. Darrick Penna. Postoperative pain management. Bouts of confusion improved with limited narcotics. JP drain removed 08/10/2012. Maintained on subcutaneous Lovenox for DVT prophylaxis. Acute blood loss anemia 7.8 and monitored . Physical and occupational therapy evaluations completed with recommendations of physical medicine rehabilitation consult to consider inpatient rehabilitation services. . Patient was felt to be a good candidate for inpatient rehabilitation services.    Past Medical History  Past Medical History  Diagnosis Date  . Prostate cancer 2010  . PAD (peripheral artery disease)   . Gangrene of toe     dry  . Neuromuscular disorder     diabetic neuropathy  . Hypertension     takes Amlodipine daily  and Metoprolol bid  . Hyperlipidemia     takes Pravastatin daily  . Myocardial infarction 2005  . Peripheral neuropathy   . Constipation     takes Miralax daily  . Hemorrhoids   . Hx of colonic polyps   . History of kidney stones   . Diabetes mellitus     Novolog and Levemir daily  . Coronary artery disease   . BPH (benign prostatic hypertrophy)   . Abnormality of gait   . Coronary atherosclerosis of native coronary artery   . Phlebitis and thrombophlebitis of other deep vessels of lower extremities   . Chronic kidney disease   . Hypertrophy of prostate without urinary obstruction and other lower urinary tract symptoms (LUTS)   . Disorder of bone and cartilage, unspecified   . Lower limb amputation, below knee   . Long term (current) use of anticoagulants   . Anemia 06/19/2012    Family History  family history includes Alzheimer's disease in his sister; Cancer in his brother and mother; Heart disease in his sister; Hyperlipidemia in his father and mother; Hypertension in his father and mother; Kidney disease in his father; and Other in his sister.   There is no history of Coronary artery disease, and Anesthesia problems, and Hypotension, and Malignant hyperthermia, and Pseudochol deficiency, .  Prior Rehab/Hospitalizations: Previous CIR admission 06/10-06/19/13 after L BKA.  Also had outpatient therapy.   Current Medications  Current facility-administered medications:acetaminophen (TYLENOL) suppository 325-650 mg, 325-650 mg, Rectal, Q4H PRN, Samantha J Rhyne, PA-C;  acetaminophen (TYLENOL) tablet 325-650 mg, 325-650 mg, Oral, Q4H PRN, [place]pe Rhyne, PA-C, 650 mg at 08/10/12 2256;  alum & mag hydroxide-simeth (MAALOX/MYLANTA) 200-200-20 MG/5ML suspension 15-30 mL, 15-30 mL, Oral, Q2H PRN, Samantha J Rhyne, PA-C amLODipine (NORVASC) tablet 5 mg, 5 mg, Oral, Daily, Samantha J Rhyne, PA-C, 5 mg at 08/11/12 1106;  aspirin EC tablet 81 mg, 81 mg, Oral, q morning - 10a, Samantha J Rhyne, PA-C, 81 mg at 08/11/12 1106;  atorvastatin (LIPITOR) tablet 10 mg, 10 mg, Oral, q1800, Samantha J Rhyne, PA-C, 10 mg at 08/09/12 1743;  bisacodyl (DULCOLAX) EC tablet 5 mg, 5 mg, Oral, BID, Samantha J Rhyne, PA-C, 5 mg at 08/10/12 2300 clopidogrel (PLAVIX) tablet 75 mg, 75 mg, Oral, Daily, Samantha J Rhyne, PA-C, 75 mg at 08/11/12 1106;  docusate sodium (COLACE) capsule 100 mg, 100 mg, Oral, Daily, Samantha J Rhyne, PA-C, 100 mg at 08/11/12 1106;  doxazosin (CARDURA) tablet 0.5 mg, 0.5 mg, Oral, QHS, Samantha J Rhyne, PA-C, 0.5 mg at 08/10/12 2300;  enoxaparin (LOVENOX) injection 30 mg, 30 mg, Subcutaneous, Q24H, Samantha J Rhyne, PA-C, 30 mg at 08/10/12 1500 guaiFENesin-dextromethorphan (ROBITUSSIN DM) 100-10 MG/5ML syrup 15 mL, 15 mL, Oral, Q4H PRN, Samantha J Rhyne, PA-C;  hydrALAZINE (APRESOLINE) injection 10 mg, 10 mg, Intravenous, Q2H PRN, Samantha J Rhyne, PA-C;  HYDROcodone-acetaminophen (NORCO/VICODIN) 5-325 MG per tablet 1-2 tablet, 1-2 tablet, Oral, Q4H PRN, Lars Mage, PA-C, 1 tablet at 08/10/12 1503 insulin aspart (novoLOG) injection 0-15 Units, 0-15  Units, Subcutaneous, TID WC, Samantha J Rhyne, PA-C, 2 Units at 08/11/12 0610;  insulin detemir (LEVEMIR) injection 18 Units, 18 Units, Subcutaneous, Daily, [place] Coupe Rhyne, PA-C, 18 Units at 08/11/12 1107;  labetalol (NORMODYNE,TRANDATE) injection 10 mg, 10 mg, Intravenous, Q2H PRN, Samantha J Rhyne, PA-C metoprolol (LOPRESSOR) injection 2-5 mg, 2-5 mg, Intravenous, Q2H PRN, Samantha J Rhyne, PA-C;  ondansetron (ZOFRAN) injection 4 mg, 4 mg, Intravenous, Q6H PRN, Samantha J Rhyne, PA-C, 4 mg at 08/08/12  1541;  pantoprazole (PROTONIX) EC tablet 40 mg, 40 mg, Oral, Daily, Samantha J Rhyne, PA-C, 40 mg at 08/11/12 1106;  phenol (CHLORASEPTIC) mouth spray 1 spray, 1 spray, Mouth/Throat, PRN, Samantha J Rhyne, PA-C polyethylene glycol (MIRALAX / GLYCOLAX) packet 17 g, 17 g, Oral, BID, Samantha J Rhyne, PA-C, 17 g at 08/11/12 1106  Patients Current Diet: Carb Control  Precautions / Restrictions Precautions Precautions: Fall Precaution Comments: LE precautions Restrictions Weight Bearing Restrictions: No   Prior Activity Level Community (5-7x/wk): Went out about 4 X a week.  Home Assistive Devices / Equipment Home Assistive Devices/Equipment: Systems developer (specify type);CBG Meter Home Equipment: Cane - single point;Wheelchair - [employer] - 2 wheels;Shower seat;Grab bars - tub/shower  Prior Functional Level Prior Function Level of Independence: Independent;Independent with assistive device(s) Gait / Transfers Assistance Needed: independent with prostesis and RW ADL's / Homemaking Assistance Needed: wife assists Comments: Required assist for curb  Current Functional Level Cognition  Overall Cognitive Status: Impaired/Different from baseline Current Attention Level: Sustained Orientation Level: Oriented to person;Oriented to situation;Disoriented to place;Disoriented to time Following Commands: Follows one step commands with increased time Safety/Judgement: Decreased awareness of  safety;Decreased awareness of deficits General Comments: daughter reports pt has diffficult time clearing cognitively after anesthesia    Extremity Assessment (includes Sensation/Coordination)  Upper Extremity Assessment: Overall WFL for tasks assessed  Lower Extremity Assessment: RLE deficits/detail;LLE deficits/detail RLE Deficits / Details: BLE BKA    ADLs  Eating/Feeding: Set up Where Assessed - Eating/Feeding: Chair Grooming: Set up;Supervision/safety Where Assessed - Grooming: Supported sitting Upper Body Bathing: Minimal assistance Where Assessed - Upper Body Bathing: Supported sitting Lower Body Bathing: Moderate assistance Where Assessed - Lower Body Bathing: Supported sitting;Lean right and/or left Upper Body Dressing: Minimal assistance Where Assessed - Upper Body Dressing: Supported sitting Lower Body Dressing: Moderate assistance Where Assessed - Lower Body Dressing: Supported sitting;Lean right and/or left Toilet Transfer: +2 Total assistance;Other (comment);Simulated (ant/post) Toilet Transfer: Patient Percentage: 60% Toileting - Clothing Manipulation and Hygiene: Simulated;Moderate assistance Transfers/Ambulation Related to ADLs: +2 ant/post transfer ADL Comments: continued vc due to apparent post op confusion    Mobility  Bed Mobility: Supine to Sit;Sitting - Scoot to Edge of Bed Supine to Sit: 3: Mod assist Sitting - Scoot to Edge of Bed: 4: Min assist Sit to Supine: 3: Mod assist    Transfers  Transfers: Not assessed Anterior-Posterior Transfer: 1: +2 Total assist;To level surface (with chuck pad) Anterior-Posterior Transfers: Patient Percentage: 30%    Ambulation / Gait / Stairs / Wheelchair Mobility  Ambulation/Gait Ambulation/Gait Assistance: Not tested (comment)    Posture / Balance Static Sitting Balance Static Sitting - Balance Support: Bilateral upper extremity supported Static Sitting - Level of Assistance: 3: Mod assist Static Sitting -  Comment/# of Minutes: patient still having difficulty with balance activities in sitting, unsure if cognition deficits are correlated to these deficits     Special needs/care consideration BiPAP/CPAP No CPM No Continuous Drip IV No Dialysis No        Life Vest No Oxygen No Special Bed No Trach Size No Wound Vac (area) NO     Skin Healed incision L BKA, New incision with R BKA, has dry skin                            Bowel mgmt: Gets constipated, takes miralax Bladder mgmt:Uses urinal Diabetic mgmt: Yes, is on oral and insulin diabetic medications at home.    Previous Home  Environment Living Arrangements: Spouse/significant other Available Help at Discharge: Family;Available 24 hours/day Type of Home: Apartment Home Layout: One level Home Access: Level entry Bathroom Shower/Tub: Tub/shower unit;Curtain Bathroom Toilet: Standard Bathroom Accessibility: Yes How Accessible: Accessible via walker Home Care Services: No  Discharge Living Setting Plans for Discharge Living Setting: Patient's home;Lives with (comment);Apartment (Lives with wife.) Type of Home at Discharge: Apartment Discharge Home Layout: One level Discharge Home Access: Stairs to enter Entrance Stairs-Number of Steps: 2 at front and 1 at back entry Does the patient have any problems obtaining your medications?: No  Social/Family/Support Systems Patient Roles: Spouse;Parent (Has wife, son, and 2 older children with previous wife.) Contact Information: Aubrey Voong - spouse Anticipated Caregiver: Wife and brother-in-law Anticipated Caregiver's Contact Information: [name] [phone] Ability/Limitations of Caregiver: Wife can assist some.  Brother-in-law will also assist. Caregiver Availability: 24/7 Discharge Plan Discussed with Primary Caregiver: Yes Is Caregiver In Agreement with Plan?: Yes Does Caregiver/Family have Issues with Lodging/Transportation while Pt is in Rehab?: No  Goals/Additional  Needs Patient/Family Goal for Rehab: PT/OT supervision, no ST needs Expected length of stay: 10-12 days Cultural Considerations: None Dietary Needs: Carb mod med calorie, thin liquids Equipment Needs: TBD Pt/Family Agrees to Admission and willing to participate: Yes Program Orientation Provided & Reviewed with Pt/Caregiver Including Roles  & Responsibilities: Yes  Decrease burden of Care through IP rehab admission:  Not applicable  Possible need for SNF placement upon discharge: Yes, especially if confusion is slow to clear.  Wife is planning to take patient home and has assistance.  Patient Condition: This patient's medical and functional status has changed since the consult dated: 08/09/12 in which the Rehabilitation Physician determined and documented that the patient's condition is appropriate for intensive rehabilitative care in an inpatient rehabilitation facility. See "History of Present Illness" (above) for medical update. Functional changes are: Currently is confused.  Requiring total assist +2 for anterior-posterior transfers.  Does have his prosthesis at the bedside.  Patient's medical and functional status update has been discussed with the Rehabilitation physician and patient remains appropriate for inpatient rehabilitation. Will admit to inpatient rehab today.  Preadmission Screen Completed By:  Trish Mage, 08/11/2012 11:56 AM ______________________________________________________________________   Discussed status with Dr. Riley Kill on 08/11/12 at 1209 and received telephone approval for admission today.  Admission Coordinator:  Trish Mage, time1209/Date08/01/14

## 2012-08-12 ENCOUNTER — Inpatient Hospital Stay (HOSPITAL_COMMUNITY): Payer: Medicare Other | Admitting: *Deleted

## 2012-08-12 ENCOUNTER — Inpatient Hospital Stay (HOSPITAL_COMMUNITY): Payer: Medicare Other | Admitting: Physical Therapy

## 2012-08-12 DIAGNOSIS — Z48812 Encounter for surgical aftercare following surgery on the circulatory system: Secondary | ICD-10-CM

## 2012-08-12 DIAGNOSIS — E118 Type 2 diabetes mellitus with unspecified complications: Secondary | ICD-10-CM

## 2012-08-12 DIAGNOSIS — I2510 Atherosclerotic heart disease of native coronary artery without angina pectoris: Secondary | ICD-10-CM

## 2012-08-12 DIAGNOSIS — D649 Anemia, unspecified: Secondary | ICD-10-CM

## 2012-08-12 LAB — GLUCOSE, CAPILLARY
Glucose-Capillary: 99 mg/dL (ref 70–99)
Glucose-Capillary: 141 mg/dL — ABNORMAL HIGH (ref 70–99)

## 2012-08-12 NOTE — Evaluation (Signed)
Physical Therapy Assessment and Plan  Patient Details  Name: Aaron Long MRN: [id number] Date of Birth: 08-Oct-1940  PT Diagnosis: Abnormality of gait and Muscle weakness Rehab Potential: Excellent ELOS: 10-14 days   Today's Date: 08/12/2012 Time: 1103-1200 Time Calculation (min): 57 min  Problem List:  Patient Active Problem List   Diagnosis Date Noted  . Aftercare following surgery of the circulatory system, NEC 08/03/2012  . Type I (juvenile type) diabetes mellitus with peripheral circulatory disorders, not stated as uncontrolled(250.71) 07/24/2012  . Anemia in chronic kidney disease(285.21) 07/24/2012  . Diabetes mellitus type 2, controlled, with complications 06/28/2012  . Fever and chills 06/19/2012  . Cellulitis of foot, right 06/19/2012  . Anemia 06/19/2012  . Atherosclerosis of native arteries of the extremities with ulceration(440.23) 06/01/2012  . Diabetic osteomyelitis 05/23/2012  . Hyperkalemia 04/24/2012  . Peripheral vascular disease, unspecified 01/27/2012  . Diabetic neuropathy, type II diabetes mellitus 07/27/2011  . Atherosclerosis of native arteries of the extremities with intermittent claudication 07/08/2011  . S/P BKA (below knee amputation) 06/22/2011  . Atherosclerosis of native arteries of the extremities with gangrene 06/10/2011  . Pre-operative cardiovascular examination 06/01/2011  . Diabetic toe ulcer 05/20/2011  . Acute kidney injury 05/02/2011  . CKD (chronic kidney disease) stage 4, GFR 15-29 ml/min 05/02/2011  . Cellulitis 05/02/2011  . Gangrene due to secondary diabetes mellitus 04/29/2011  . Diabetic foot ulcer associated with type 2 diabetes mellitus 04/28/2011  . HTN (hypertension) 04/28/2011  . CAD (coronary artery disease) 04/28/2011  . PAD (peripheral artery disease) 04/28/2011  . Prostate cancer 04/28/2011    Past Medical History:  Past Medical History  Diagnosis Date  . Prostate cancer 2010  . PAD (peripheral artery disease)    . Gangrene of toe     dry  . Neuromuscular disorder     diabetic neuropathy  . Hypertension     takes Amlodipine daily and Metoprolol bid  . Hyperlipidemia     takes Pravastatin daily  . Myocardial infarction 2005  . Peripheral neuropathy   . Constipation     takes Miralax daily  . Hemorrhoids   . Hx of colonic polyps   . History of kidney stones   . Diabetes mellitus     Novolog and Levemir daily  . Coronary artery disease   . BPH (benign prostatic hypertrophy)   . Abnormality of gait   . Coronary atherosclerosis of native coronary artery   . Phlebitis and thrombophlebitis of other deep vessels of lower extremities   . Chronic kidney disease   . Hypertrophy of prostate without urinary obstruction and other lower urinary tract symptoms (LUTS)   . Disorder of bone and cartilage, unspecified   . Lower limb amputation, below knee   . Long term (current) use of anticoagulants   . Anemia 06/19/2012   Past Surgical History:  Past Surgical History  Procedure Laterality Date  . Prostate surgery  2009    prostatectomy  . Knee cartilage surgery  at age 57    left knee  . Cea      Right  . Coronary artery bypass graft  2005    x 4  . Carotid endarterectomy  2005    right  . Cataract surgery  2011    bilateral  . Colonoscopy    . Cardiac catheterization  05/28/11  . Amputation  06/14/2011    Procedure: AMPUTATION DIGIT;  Surgeon: Sherren Kerns, MD;  Location: [facility] OR;  Service: Vascular;  Laterality: Left;  Amputation Left fifth toe  . Amputation  06/16/2011    Procedure: AMPUTATION BELOW KNEE;  Surgeon: Sherren Kerns, MD;  Location: [facility] OR;  Service: Vascular;  Laterality: Left;  . I&d extremity  01/31/2012    Procedure: IRRIGATION AND DEBRIDEMENT EXTREMITY;  Surgeon: Sherren Kerns, MD;  Location: [facility] OR;  Service: Vascular;  Laterality: Left;  I & D Left BKA   . Amputation Right 06/12/2012    Procedure: AMPUTATION DIGIT;  Surgeon: Sherren Kerns, MD;  Location: [facility] OR;   Service: Vascular;  Laterality: Right;  GREAT TOE  . Below knee leg amputation Right 08/08/2012    Dr Darrick Penna  . Amputation Right 08/08/2012    Procedure: AMPUTATION BELOW KNEE;  Surgeon: Sherren Kerns, MD;  Location: [facility] OR;  Service: Vascular;  Laterality: Right;    Assessment & Plan Clinical Impression: Pt is a 72 y.o. right-handed male with history of CAD, chronic renal insufficiency with baseline creatinine 2.6-2.9, diabetes mellitus peripheral neuropathy and history of left below-knee amputation with inpatient rehabilitation services 06/21/2011- 06/30/2011. Patient utilized left prosthesis and walker prior to admission. Admitted 08/07/2012 with nonhealing wound gangrenous changes right foot and recent right first toe amputation. Recent x-rays right foot with early signs of osteomyelitis. Underwent right below knee amputation 08/08/2012 per Dr. Darrick Penna. Postoperative pain management. Bouts of confusion improved with limited narcotics. JP drain removed 08/10/2012.   Patient transferred to CIR on 08/11/2012 .   Patient currently requires max with mobility secondary to muscle weakness and muscle joint tightness and decreased cardiorespiratoy endurance.  Prior to hospitalization, patient was modified independent  with mobility and lived with Spouse in a Apartment home.  Home access is  Level entry.  Patient will benefit from skilled PT intervention to maximize safe functional mobility, minimize fall risk and decrease caregiver burden for planned discharge home with 24 hour supervision.  Anticipate patient will benefit from follow up HH at discharge.  PT - End of Session Activity Tolerance: Tolerates < 10 min activity, no significant change in vital signs Endurance Deficit: Yes PT Assessment Rehab Potential: Excellent Barriers to Discharge: Inaccessible home environment PT Plan PT Intensity: Minimum of 1-2 x/day ,45 to 90 minutes PT Frequency: 5 out of 7 days PT Duration Estimated Length of  Stay: 10-14 days PT Treatment/Interventions: Ambulation/gait training;Balance/vestibular training;Discharge planning;Patient/family education;Pain management;Neuromuscular re-education;Functional mobility training;DME/adaptive equipment instruction;UE/LE Strength taining/ROM;UE/LE Coordination activities;Wheelchair propulsion/positioning;Stair training;Therapeutic Activities;Therapeutic Exercise PT Recommendation Follow Up Recommendations: Home health PT Patient destination: Home Equipment Recommended: Wheelchair (measurements);Rolling walker with 5" wheels  Skilled Therapeutic Intervention Pt was seen bedside in the am. Performed wheelchair mobility 150 feet x2 for to build overall activity tolerance. Noted fluctuating periods of confusion throughout evaluation. Pt stood in the parallel bars x 3 with mod-max Ax1, pt stood about 15 - 30 secs each time. Ambulation deferred. Pt is motivated to improve. Pt required verbal cues for transfers in and out bed for technique as well as increased assistance with transfers from wheelchair to bed.   PT Evaluation Precautions/Restrictions Fall, B BKAs  General   Vital Signs  Pain Pain Assessment Pain Assessment: No/denies pain Home Living/Prior Functioning Home Living Available Help at Discharge: Available 24 hours/day;Family Type of Home: Apartment Home Access: Level entry Home Layout: One level  Lives With: Spouse Prior Function Level of Independence: Independent with gait;Independent with transfers;Requires assistive device for independence  Driving: Yes Vocation: Retired Comments: Required assist for curb Vision/Perception  Vision - History Baseline Vision: Surveyor, minerals Overall Cognitive  Status: Within Functional Limits for tasks assessed Orientation Level: Oriented to person;Oriented to place;Oriented to time;Oriented to situation  Safety/Judgment: Appears intact Comments: periods of confusion noted throughout  evaluation Sensation Sensation  Additional Comments:  (grossly intact to light touch B BKAs)  Motor  Motor Motor: Within Functional Limits  Mobility Bed Mobility Bed Mobility: Left Sidelying to Sit;Scooting to [facility] Rolling Left: 5: Supervision Left Sidelying to Sit: With rails;HOB elevated Supine to Sit: HOB flat;4: Min guard Supine to Sit Details: Verbal cues for sequencing;Verbal cues for technique Sit to Supine: HOB flat;5: Supervision Scooting to HOB: 4: Min assist Locomotion  Ambulation Ambulation/Gait Assistance: Not tested (comment) Corporate treasurer: Yes Wheelchair Assistance: 5: Investment banker, operational: Both upper extremities  Trunk/Postural Assessment   Lumbar Assessment Lumbar Assessment: Exceptions to [facility] Lumbar AROM Overall Lumbar AROM: Deficits  Balance  Static Sitting Balance Static Sitting - Balance Support: Bilateral upper extremity supported Static Sitting - Level of Assistance: 5: Stand by assistance Extremity Assessment   RLE Assessment RLE Assessment: Exceptions to [facility] RLE AROM (degrees) Overall AROM Right Lower Extremity: Within functional limits for tasks assessed RLE Overall AROM Comments: WFLs at the hip and knee RLE Strength RLE Overall Strength: Other (Comment) RLE Overall Strength Comments: grossly 3/5, not formally assessed secondary to recent BKA LLE Assessment LLE Assessment: Exceptions to [facility] LLE AROM (degrees) Overall AROM Left Lower Extremity: Within functional limits for tasks assessed LLE Strength LLE Overall Strength: Within Functional Limits for tasks assessed LLE Overall Strength Comments:  (grossly 4/5-4+/5 throughout)  FIM:  FIM - Bed/Chair Transfer Bed/Chair Transfer Assistive Devices: Bed rails;Prosthesis Bed/Chair Transfer: 4: Supine > Sit: Min A (steadying Pt. > 75%/lift 1 leg);5: Sit > Supine: Supervision (verbal cues/safety issues);4: Bed > Chair or W/C: Min A (steadying Pt. >  75%);3: Chair or W/C > Bed: Mod A (lift or lower assist) FIM - Locomotion: Wheelchair Locomotion: Wheelchair: 5: Travels 150 ft or more: maneuvers on rugs and over door sills with supervision, cueing or coaxing FIM - Locomotion: Ambulation Ambulation/Gait Assistance: Not tested (comment) Locomotion: Ambulation: 0: Activity did not occur FIM - Locomotion: Stairs Locomotion: Stairs: 0: Activity did not occur   Refer to Care Plan for Long Term Goals  Recommendations for other services: None  Discharge Criteria: Patient will be discharged from PT if patient refuses treatment 3 consecutive times without medical reason, if treatment goals not met, if there is a change in medical status, if patient makes no progress towards goals or if patient is discharged from hospital.  The above assessment, treatment plan, treatment alternatives and goals were discussed and mutually agreed upon: by patient and by family  [name] 08/12/2012, 1:16 PM

## 2012-08-12 NOTE — Progress Notes (Signed)
Physical Therapy Session Note  Patient Details  Name: Aaron Long MRN: [id number] Date of Birth: 05/09/1940  Today's Date: 08/12/2012 Time: 4098-1191 Time Calculation (min): 43 min  Short Term Goals: Week 1:  PT Short Term Goal 1 (Week 1): Pt will required S for rolling and supine to sit, sit to supine without use of siderail and head of bed flat.  PT Short Term Goal 2 (Week 1): Pt will demonstrate transfs w/c to bed, bed to w/c with LRAD and supervision.  PT Short Term Goal 3 (Week 1): Pt will propel w/c greater than 150 feet indepdently.  PT Short Term Goal 4 (Week 1): Pt will ambulate with rolling walker and mod Ax1, with L prosthesis ~ 15 feet.  PT Short Term Goal 5 (Week 1): Pt will increase R LE strength to at least 3+/5 throughout.   Skilled Therapeutic Interventions/Progress Updates:  Pt was seen bedside in the pm, propelled to and from rehab gym with B UEs for overall conditioning with supervision and verbal ces for encouragement. While in gym, treatment focused on R LE ROM exercises as tolerated. Attempted sliding board chair to bed with min Ax1 and verbal cues for technique and safety. Transferred edge of bed to supine with supervision. Pt moved up in bed with bed controls and min Ax1.   Therapy Documentation Precautions:  Precautions Precautions: Fall;Other (comment) (B BKA)  General:  Pain: Pain Assessment Pain Assessment: No/denies pain Mobility: Bed Mobility Rolling Left: 5: Supervision Supine to Sit: HOB flat;4: Min guard Sit to Supine: HOB flat;5: Supervision Locomotion : Ambulation Ambulation/Gait Assistance: Not tested (comment) Naval architect Mobility: Yes Wheelchair Assistance: 5: Investment banker, operational: Both upper extremities  Trunk/Postural Assessment : Lumbar AROM Overall Lumbar AROM: Deficits  Balance: Cabin crew Sitting - Balance Support: Bilateral upper extremity supported Static Sitting -  Level of Assistance: 5: Stand by assistance  See FIM for current functional status  Therapy/Group: Individual Therapy  [name] 08/12/2012, 3:37 PM

## 2012-08-12 NOTE — Evaluation (Signed)
Occupational Therapy Assessment and Plan  Patient Details  Name: Aaron Long MRN: [id number] Date of Birth: 01/07/1941  OT Diagnosis: muscle weakness (generalized) Rehab Potential: Rehab Potential: Good ELOS: 2-weeks    Today's Date: 08/12/2012 Time:  -  0930-1100  (90 min)  1st session                 1330-1400  (30 min0  (2nd session)    Problem List:  Patient Active Problem List   Diagnosis Date Noted  . Aftercare following surgery of the circulatory system, NEC 08/03/2012  . Type I (juvenile type) diabetes mellitus with peripheral circulatory disorders, not stated as uncontrolled(250.71) 07/24/2012  . Anemia in chronic kidney disease(285.21) 07/24/2012  . Diabetes mellitus type 2, controlled, with complications 06/28/2012  . Fever and chills 06/19/2012  . Cellulitis of foot, right 06/19/2012  . Anemia 06/19/2012  . Atherosclerosis of native arteries of the extremities with ulceration(440.23) 06/01/2012  . Diabetic osteomyelitis 05/23/2012  . Hyperkalemia 04/24/2012  . Peripheral vascular disease, unspecified 01/27/2012  . Diabetic neuropathy, type II diabetes mellitus 07/27/2011  . Atherosclerosis of native arteries of the extremities with intermittent claudication 07/08/2011  . S/P BKA (below knee amputation) 06/22/2011  . Atherosclerosis of native arteries of the extremities with gangrene 06/10/2011  . Pre-operative cardiovascular examination 06/01/2011  . Diabetic toe ulcer 05/20/2011  . Acute kidney injury 05/02/2011  . CKD (chronic kidney disease) stage 4, GFR 15-29 ml/min 05/02/2011  . Cellulitis 05/02/2011  . Gangrene due to secondary diabetes mellitus 04/29/2011  . Diabetic foot ulcer associated with type 2 diabetes mellitus 04/28/2011  . HTN (hypertension) 04/28/2011  . CAD (coronary artery disease) 04/28/2011  . PAD (peripheral artery disease) 04/28/2011  . Prostate cancer 04/28/2011    Past Medical History:  Past Medical History  Diagnosis Date  .  Prostate cancer 2010  . PAD (peripheral artery disease)   . Gangrene of toe     dry  . Neuromuscular disorder     diabetic neuropathy  . Hypertension     takes Amlodipine daily and Metoprolol bid  . Hyperlipidemia     takes Pravastatin daily  . Myocardial infarction 2005  . Peripheral neuropathy   . Constipation     takes Miralax daily  . Hemorrhoids   . Hx of colonic polyps   . History of kidney stones   . Diabetes mellitus     Novolog and Levemir daily  . Coronary artery disease   . BPH (benign prostatic hypertrophy)   . Abnormality of gait   . Coronary atherosclerosis of native coronary artery   . Phlebitis and thrombophlebitis of other deep vessels of lower extremities   . Chronic kidney disease   . Hypertrophy of prostate without urinary obstruction and other lower urinary tract symptoms (LUTS)   . Disorder of bone and cartilage, unspecified   . Lower limb amputation, below knee   . Long term (current) use of anticoagulants   . Anemia 06/19/2012   Past Surgical History:  Past Surgical History  Procedure Laterality Date  . Prostate surgery  2009    prostatectomy  . Knee cartilage surgery  at age 24    left knee  . Cea      Right  . Coronary artery bypass graft  2005    x 4  . Carotid endarterectomy  2005    right  . Cataract surgery  2011    bilateral  . Colonoscopy    . Cardiac catheterization  05/28/11  . Amputation  06/14/2011    Procedure: AMPUTATION DIGIT;  Surgeon: Sherren Kerns, MD;  Location: [facility] OR;  Service: Vascular;  Laterality: Left;  Amputation Left fifth toe  . Amputation  06/16/2011    Procedure: AMPUTATION BELOW KNEE;  Surgeon: Sherren Kerns, MD;  Location: [facility] OR;  Service: Vascular;  Laterality: Left;  . I&d extremity  01/31/2012    Procedure: IRRIGATION AND DEBRIDEMENT EXTREMITY;  Surgeon: Sherren Kerns, MD;  Location: [facility] OR;  Service: Vascular;  Laterality: Left;  I & D Left BKA   . Amputation Right 06/12/2012    Procedure: AMPUTATION  DIGIT;  Surgeon: Sherren Kerns, MD;  Location: [facility] OR;  Service: Vascular;  Laterality: Right;  GREAT TOE  . Below knee leg amputation Right 08/08/2012    Dr Darrick Penna  . Amputation Right 08/08/2012    Procedure: AMPUTATION BELOW KNEE;  Surgeon: Sherren Kerns, MD;  Location: [facility] OR;  Service: Vascular;  Laterality: Right;    Assessment & Plan Clinical Impression:Aaron Long is a 72 y.o. right-handed male with history of CAD, chronic renal insufficiency with baseline creatinine 2.6-2.9, diabetes mellitus peripheral neuropathy and history of left below-knee amputation with inpatient rehabilitation services 06/21/2011- 06/30/2011. Patient used left prosthesis prior to admission as well as her walker. Admitted 08/07/2012 with nonhealing wound gangrenous changes right foot and recent right first toe amputation. Recent x-rays right foot with early signs of osteomyelitis. Underwent right below knee amputation 08/08/2012 per Dr. Darrick Penna. Postoperative pain management. Bouts of confusion improved with limited narcotics. JP drain removed 08/10/2012. Maintained on subcutaneous Lovenox for DVT prophylaxis. Acute blood loss anemia 7.8 and monitored . Physical and occupational therapy evaluations completed with recommendations of physical medicine rehabilitation consult to consider inpatient rehabilitation services. . Patient was felt to be a good candidate for inpatient rehabilitation services was admitted for comprehensive rehabilitation program      Patient transferred to CIR on 08/11/2012 .    Patient currently requires mod with basic self-care skills secondary to muscle weakness.  Prior to hospitalization, patient could complete BADL with modified independent .  Patient will benefit from skilled intervention to increase independence with basic self-care skills prior to discharge home with care partner.  Anticipate patient will require intermittent supervision and follow up home health.  OT - End of  Session Activity Tolerance: Tolerates < 10 min activity, no significant change in vital signs Endurance Deficit: Yes OT Assessment Rehab Potential: Good OT Plan OT Intensity: Minimum of 1-2 x/day, 45 to 90 minutes OT Frequency: 5 out of 7 days OT Duration/Estimated Length of Stay: 2-weeks    Skilled Therapeutic Intervention 2nd sessiion:   Engaged in functional transfer from wc to toilet and wc propulsion.  Pt. Propelled wc from bed area to bathroom with minimal verbal cues and set up.  Transferred from wc to toilet using grab bar and moderate assist.  Pt. Did pants with minimal assist and grab bars  OT Evaluation Precautions/Restrictions  Precautions Precautions: Fall Precaution Comments: LE precautions Restrictions Weight Bearing Restrictions: Yes RLE Weight Bearing:  (amputated leg)       Pain Pain Assessment Pain Score:-No pain Home Living/Prior Functioning Home Living Available Help at Discharge: Family;Available 24 hours/day Type of Home: Apartment Home Access: Level entry Home Layout: One level  Lives With: Spouse IADL History Current License: Yes Mode of Transportation: Car Leisure and Hobbies:  (use to play tennis and golf) Prior Function  Able to Take Stairs?: No Driving: Yes  Vocation: Retired ADL  SEE FIM   Vision/Perception  Vision - History Baseline Vision: Medical laboratory scientific officer: Within Systems developer Vision Assessment: Vision not tested Perception Perception: Within Functional Limits Praxis Praxis: Intact  Cognition Overall Cognitive Status: Within Functional Limits for tasks assessed Orientation Level: Oriented to person;Oriented to place;Disoriented to situation;Disoriented to time Attention: Sustained Sustained Attention: Appears intact Memory: Appears intact Awareness: Appears intact Problem Solving: Appears intact Safety/Judgment: Appears intact Sensation Sensation Light Touch: Appears Intact (intact for  BUE) Coordination Gross Motor Movements are Fluid and Coordinated: Yes Fine Motor Movements are Fluid and Coordinated: Yes Motor  SEE NAV    Mobility  Bed Mobility Bed Mobility: Left Sidelying to Sit;Scooting to [facility] Rolling Left: With rail Left Sidelying to Sit: With rails;HOB elevated Supine to Sit: 4: Min assist;With rails;HOB elevated Supine to Sit Details: Verbal cues for sequencing;Verbal cues for technique Scooting to HOB: 4: Min assist  Trunk/Postural Assessment  Cervical Assessment Cervical Assessment: Within Functional Limits Thoracic Assessment Thoracic Assessment: Within Functional Limits Lumbar Assessment Lumbar Assessment: Exceptions to [facility] Lumbar AROM Overall Lumbar AROM:  (posterior pelvic tilt;; able to lateral lean to left to wash) Postural Control Postural Control: Deficits on evaluation Righting Reactions:  (decreased lateral leaning balance)  Balance Balance Balance Assessed:  (see nav) Standardized Balance Assessment Standardized Balance Assessment:  (see nav) Static Sitting Balance Static Sitting - Balance Support: No upper extremity supported Static Sitting - Level of Assistance: 5: Stand by assistance Extremity/Trunk Assessment RUE Assessment RUE Assessment: Within Functional Limits LUE Assessment LUE Assessment: Within Functional Limits        Refer to Care Plan for Long Term Goals  Recommendations for other services: None  Discharge Criteria: Patient will be discharged from OT if patient refuses treatment 3 consecutive times without medical reason, if treatment goals not met, if there is a change in medical status, if patient makes no progress towards goals or if patient is discharged from hospital.  The above assessment, treatment plan, treatment alternatives and goals were discussed and mutually agreed upon: by patient  Humberto Seals 08/12/2012, 10:39 AM

## 2012-08-12 NOTE — Progress Notes (Signed)
Aaron Long is a 72 y.o. male [birth year] [id number]  Subjective: No new complaints. No new problems. Slept well. Feeling OK.  Objective: Vital signs in last 24 hours: Temp:  [98.1 F (36.7 C)-99.3 F (37.4 C)] 98.1 F (36.7 C) (08/02 1420) Pulse Rate:  [103-107] 103 (08/02 1420) Resp:  [18] 18 (08/02 1420) BP: (133-178)/(61-75) 133/64 mmHg (08/02 1420) SpO2:  [99 %] 99 % (08/02 1420) Weight:  [185 lb 10 oz (84.2 kg)] 185 lb 10 oz (84.2 kg) (08/01 1610) Weight change:  Last BM Date: 08/07/12  Intake/Output from previous day: 08/01 0701 - 08/02 0700 In: 240 [P.O.:240] Out: 1300 [Urine:1300] Last cbgs: CBG (last 3)   Recent Labs  08/11/12 2112 08/12/12 0735 08/12/12 1128  GLUCAP 175* 145* 99     Physical Exam General: No apparent distress    HEENT: moist mucosa Lungs: Normal effort. Lungs clear to auscultation, no crackles or wheezes. Cardiovascular: Regular rate and rhythm, no edema Musculoskeletal:  No change from before Neurological: No new neurological deficits Wounds: N/A    Skin: clear Alert, cooperative   Lab Results: BMET    Component Value Date/Time   NA 137 08/11/2012 0540   NA 143 05/23/2012 1250   K 5.0 08/11/2012 0540   CL 103 08/11/2012 0540   CO2 24 08/11/2012 0540   GLUCOSE 145* 08/11/2012 0540   GLUCOSE 84 05/23/2012 1250   BUN 34* 08/11/2012 0540   BUN 39* 05/23/2012 1250   CREATININE 2.56* 08/11/2012 1633   CALCIUM 9.0 08/11/2012 0540   GFRNONAA 23* 08/11/2012 1633   GFRAA 27* 08/11/2012 1633   CBC    Component Value Date/Time   WBC 10.4 08/11/2012 1633   RBC 2.76* 08/11/2012 1633   HGB 7.9* 08/11/2012 1633   HCT 24.3* 08/11/2012 1633   PLT 153 08/11/2012 1633   MCV 88.0 08/11/2012 1633   MCH 28.6 08/11/2012 1633   MCHC 32.5 08/11/2012 1633   RDW 16.6* 08/11/2012 1633   LYMPHSABS 1.6 07/11/2012 1556   MONOABS 0.6 07/11/2012 1556   EOSABS 0.4 07/11/2012 1556   BASOSABS 0.0 07/11/2012 1556    Studies/Results: No results found.  Medications: I have reviewed the  patient's current medications.  Assessment/Plan:  1. Right BKA 08/08/2012/history of left BKA  -has prosthesis with him  -ACE wrap, dressing for right residual limb  2. DVT Prophylaxis/Anticoagulation: Subcutaneous Lovenox. Monitor platelet counts  3. Pain Management: Hydrocodone prn. Monitor mental status (see below)  4. Mood/postoperative confusion. Limited narcotics and monitor with increased mobility---much improved today  5. Neuropsych: This patient is capable of making decisions on his own behalf.  6. Acute blood loss anemia. Followup CBC and monitor for any bleeding episodes. Wound without drainage at present  7. Chronic renal insufficiency. Baseline creatinine 2.6-2.9. Followup chemistries  8. Diabetes mellitus with peripheral neuropathy. Hemoglobin A1c 6.8. Levemir 18 units daily. Check blood sugars a.c. and at bedtime. Adjust regimen as needed moving forward.  9. Hypertension. Norvasc 5 mg daily, Cardura 0.5 mg each bedtime. Monitor with increased mobility  10. Hyperlipidemia. Lipitor  12. Coronary artery disease with history of myocardial infarction. Continue Plavix/aspirin. No complaints of chest pain     Length of stay, days: 1  Sonda Primes , MD 08/12/2012, 2:30 PM

## 2012-08-13 ENCOUNTER — Inpatient Hospital Stay (HOSPITAL_COMMUNITY): Payer: Medicare Other | Admitting: Physical Therapy

## 2012-08-13 DIAGNOSIS — D631 Anemia in chronic kidney disease: Secondary | ICD-10-CM

## 2012-08-13 DIAGNOSIS — N184 Chronic kidney disease, stage 4 (severe): Secondary | ICD-10-CM

## 2012-08-13 DIAGNOSIS — N179 Acute kidney failure, unspecified: Secondary | ICD-10-CM

## 2012-08-13 DIAGNOSIS — I10 Essential (primary) hypertension: Secondary | ICD-10-CM

## 2012-08-13 LAB — GLUCOSE, CAPILLARY: Glucose-Capillary: 147 mg/dL — ABNORMAL HIGH (ref 70–99)

## 2012-08-13 MED ORDER — METOPROLOL SUCCINATE 12.5 MG HALF TABLET
12.5000 mg | ORAL_TABLET | Freq: Every day | ORAL | Status: DC
  Administered 2012-08-13 – 2012-08-21 (×9): 12.5 mg via ORAL
  Filled 2012-08-13 (×10): qty 1

## 2012-08-13 NOTE — Progress Notes (Signed)
Aaron Long is a 72 y.o. male 1941-01-05 [id number]  Subjective: No new complaints. No new problems. Slept well. Feeling OK.  Objective: Vital signs in last 24 hours: Temp:  [98.1 F (36.7 C)-98.4 F (36.9 C)] 98.4 F (36.9 C) (08/03 0513) Pulse Rate:  [86-111] 86 (08/03 0622) Resp:  [18] 18 (08/03 0513) BP: (133-195)/(64-83) 164/67 mmHg (08/03 0622) SpO2:  [98 %-99 %] 98 % (08/03 0513) Weight change:  Last BM Date: 08/12/12  Intake/Output from previous day: 08/02 0701 - 08/03 0700 In: 720 [P.O.:720] Out: 1150 [Urine:1150] Last cbgs: CBG (last 3)   Recent Labs  08/12/12 1615 08/12/12 2020 08/13/12 0722  GLUCAP 155* 141* 147*     Physical Exam General: No apparent distress    HEENT: moist mucosa Lungs: Normal effort. Lungs clear to auscultation, no crackles or wheezes. Cardiovascular: Regular rate and rhythm, no edema Musculoskeletal:  No change from before Neurological: No new neurological deficits Wounds: N/A    Skin: clear Alert, cooperative   Lab Results: BMET    Component Value Date/Time   NA 137 08/11/2012 0540   NA 143 05/23/2012 1250   K 5.0 08/11/2012 0540   CL 103 08/11/2012 0540   CO2 24 08/11/2012 0540   GLUCOSE 145* 08/11/2012 0540   GLUCOSE 84 05/23/2012 1250   BUN 34* 08/11/2012 0540   BUN 39* 05/23/2012 1250   CREATININE 2.56* 08/11/2012 1633   CALCIUM 9.0 08/11/2012 0540   GFRNONAA 23* 08/11/2012 1633   GFRAA 27* 08/11/2012 1633   CBC    Component Value Date/Time   WBC 10.4 08/11/2012 1633   RBC 2.76* 08/11/2012 1633   HGB 7.9* 08/11/2012 1633   HCT 24.3* 08/11/2012 1633   PLT 153 08/11/2012 1633   MCV 88.0 08/11/2012 1633   MCH 28.6 08/11/2012 1633   MCHC 32.5 08/11/2012 1633   RDW 16.6* 08/11/2012 1633   LYMPHSABS 1.6 07/11/2012 1556   MONOABS 0.6 07/11/2012 1556   EOSABS 0.4 07/11/2012 1556   BASOSABS 0.0 07/11/2012 1556    Studies/Results: No results found.  Medications: I have reviewed the patient's current medications.  Assessment/Plan:  1. Right  BKA 08/08/2012/history of left BKA  -has prosthesis with him  -ACE wrap, dressing for right residual limb  2. DVT Prophylaxis/Anticoagulation: Subcutaneous Lovenox. Monitor platelet counts  3. Pain Management: Hydrocodone prn. Monitor mental status (see below)  4. Mood/postoperative confusion. Limited narcotics and monitor with increased mobility---much improved today  5. Neuropsych: This patient is capable of making decisions on his own behalf.  6. Acute blood loss anemia. Followup CBC and monitor for any bleeding episodes. Wound without drainage at present  7. Chronic renal insufficiency. Baseline creatinine 2.6-2.9. Followup chemistries  8. Diabetes mellitus with peripheral neuropathy. Hemoglobin A1c 6.8. Levemir 18 units daily. Check blood sugars a.c. and at bedtime. Adjust regimen as needed moving forward.  9. Hypertension. Norvasc 5 mg daily, Cardura 0.5 mg each bedtime. Added low dose Toprol XL Monitor with increased mobility  10. Hyperlipidemia. Lipitor  12. Coronary artery disease with history of myocardial infarction. Continue Plavix/aspirin. No complaints of chest pain     Length of stay, days: 2  Sonda Primes , MD 08/13/2012, 10:31 AM

## 2012-08-13 NOTE — Progress Notes (Signed)
Physical Therapy Session Note  Patient Details  Name: Aaron Long MRN: [id number] Date of Birth: 11/09/1940  Today's Date: 08/13/2012 Time: 0800-0845 Time Calculation (min): 45 min  Short Term Goals: Week 1:  PT Short Term Goal 1 (Week 1): Pt will required S for rolling and supine to sit, sit to supine without use of siderail and head of bed flat.  PT Short Term Goal 2 (Week 1): Pt will demonstrate transfs w/c to bed, bed to w/c with LRAD and supervision.  PT Short Term Goal 3 (Week 1): Pt will propel w/c greater than 150 feet indepdently.  PT Short Term Goal 4 (Week 1): Pt will ambulate with rolling walker and mod Ax1, with L prosthesis ~ 15 feet.  PT Short Term Goal 5 (Week 1): Pt will increase R LE strength to at least 3+/5 throughout.   Skilled Therapeutic Interventions/Progress Updates:  Pt was seen bedside in the am, eating breakfast on edge of bed. Pt transferred edge of bed to supine with side rail and supervision. Pt rolled R/L with side rail and supervision to assist with dressing. Pt transferred supine to edge of bed with side rail and min Ax1. Pt transferred edge of bed to w/c with sliding board and min Ax1. Pt propelled w/c 150 feet with B UEs and supervision to rehab gym. Performed w/c push ups 3 sets x 5 reps each. Pt tolerated treatment well.   Therapy Documentation Precautions:  Precautions Precautions: Fall;Other (comment) (B BKA)  Pain: Pain Assessment Pain Assessment: No/denies pain Pain Score: 0-No pain   See FIM for current functional status  Therapy/Group: Individual Therapy  [name] 08/13/2012, 12:34 PM

## 2012-08-14 ENCOUNTER — Inpatient Hospital Stay (HOSPITAL_COMMUNITY): Payer: Medicare Other | Admitting: *Deleted

## 2012-08-14 ENCOUNTER — Inpatient Hospital Stay (HOSPITAL_COMMUNITY): Payer: Medicare Other | Admitting: Occupational Therapy

## 2012-08-14 DIAGNOSIS — I739 Peripheral vascular disease, unspecified: Secondary | ICD-10-CM

## 2012-08-14 DIAGNOSIS — L98499 Non-pressure chronic ulcer of skin of other sites with unspecified severity: Secondary | ICD-10-CM

## 2012-08-14 DIAGNOSIS — E1165 Type 2 diabetes mellitus with hyperglycemia: Secondary | ICD-10-CM

## 2012-08-14 DIAGNOSIS — N186 End stage renal disease: Secondary | ICD-10-CM

## 2012-08-14 DIAGNOSIS — Z89519 Acquired absence of unspecified leg below knee: Secondary | ICD-10-CM

## 2012-08-14 DIAGNOSIS — S88119A Complete traumatic amputation at level between knee and ankle, unspecified lower leg, initial encounter: Secondary | ICD-10-CM

## 2012-08-14 LAB — COMPREHENSIVE METABOLIC PANEL
ALT: 8 U/L (ref 0–53)
BUN: 35 mg/dL — ABNORMAL HIGH (ref 6–23)
CO2: 26 mEq/L (ref 19–32)
Calcium: 9.2 mg/dL (ref 8.4–10.5)
Creatinine, Ser: 2.49 mg/dL — ABNORMAL HIGH (ref 0.50–1.35)
GFR calc Af Amer: 28 mL/min — ABNORMAL LOW (ref >90)
GFR calc non Af Amer: 24 mL/min — ABNORMAL LOW (ref >90)
Glucose, Bld: 137 mg/dL — ABNORMAL HIGH (ref 70–99)
Total Protein: 6.5 g/dL (ref 6.0–8.3)

## 2012-08-14 LAB — CBC WITH DIFFERENTIAL/PLATELET
Eosinophils Absolute: 0.4 10*3/uL (ref 0.0–0.7)
Eosinophils Relative: 6 % — ABNORMAL HIGH (ref 0–5)
HCT: 23 % — ABNORMAL LOW (ref 39.0–52.0)
Lymphocytes Relative: 25 % (ref 12–46)
Lymphs Abs: 1.7 10*3/uL (ref 0.7–4.0)
MCH: 28.9 pg (ref 26.0–34.0)
MCV: 87.5 fL (ref 78.0–100.0)
Monocytes Absolute: 0.7 10*3/uL (ref 0.1–1.0)
Monocytes Relative: 10 % (ref 3–12)
RBC: 2.63 MIL/uL — ABNORMAL LOW (ref 4.22–5.81)
WBC: 6.6 10*3/uL (ref 4.0–10.5)

## 2012-08-14 LAB — GLUCOSE, CAPILLARY
Glucose-Capillary: 158 mg/dL — ABNORMAL HIGH (ref 70–99)
Glucose-Capillary: 121 mg/dL — ABNORMAL HIGH (ref 70–99)
Glucose-Capillary: 146 mg/dL — ABNORMAL HIGH (ref 70–99)
Glucose-Capillary: 164 mg/dL — ABNORMAL HIGH (ref 70–99)

## 2012-08-14 NOTE — Progress Notes (Signed)
Occupational Therapy Session Note  Patient Details  Name: Aaron Long MRN: [id number] Date of Birth: [birth year]  Today's Date: 08/14/2012 Time: 1005-1100 Time Calculation (min): 55 min  Short Term Goals: Week 1:  OT Short Term Goal 1 (Week 1): Pt. will do lateral leans for pericare with SBA OT Short Term Goal 2 (Week 1): Pt. will transfer to toilet with min assist OT Short Term Goal 3 (Week 1): Pt. will stand  at sink for 30 seconds with min assist for balance.    Skilled Therapeutic Interventions/Progress Updates:    Pt seen for BADL retraining of B/D at sink level with a focus on sit to partial stand and functional transfers. Pt was able to complete a partial stand 6x  for changing of brief and pants.  He used lateral leans to wash his bottom. His R residual limb was wrapped with ace wrap.  At end of session, pt requested to go back to bed as his pain medication was making him dizzy.  Pt requested to not use SB.  Completed transfer w/c to bed with scoots with guiding assist and mod assist for the last lift onto the mattress. Pt was able to lie down without assist.  Pt resting in bed with call light in reach.  Therapy Documentation Precautions:  Precautions Precautions: Fall;Other (comment) (B BKA) Precaution Comments: LE precautions Restrictions Weight Bearing Restrictions: Yes RLE Weight Bearing:  (amputated leg)   Pain: Pain Assessment Pain Assessment: 0-10 Pain Score: 10-Worst pain ever Pain Type: Surgical pain Pain Location: Leg Pain Orientation: Right Pain Descriptors / Indicators: Throbbing Pain Frequency: Intermittent Pain Onset: Gradual Patients Stated Pain Goal: 3 Pain Intervention(s): Medication (See eMAR);Repositioned;Emotional support Multiple Pain Sites: No ADL:  See FIM for current functional status  Therapy/Group: Individual Therapy  Oneida Mckamey 08/14/2012, 11:23 AM

## 2012-08-14 NOTE — Care Management Note (Signed)
[facility] Individual Statement of Services  Patient Name:  Aaron Long  Date:  08/14/2012  Welcome to the [facility].  Our goal is to provide you with an individualized program based on your diagnosis and situation, designed to meet your specific needs.  With this comprehensive rehabilitation program, you will be expected to participate in at least 3 hours of rehabilitation therapies Monday-Friday, with modified therapy programming on the weekends.  Your rehabilitation program will include the following services:  Physical Therapy (PT), Occupational Therapy (OT), 24 hour per day rehabilitation nursing, Case Management ( Social Worker), Rehabilitation Medicine, Nutrition Services and Pharmacy Services  Weekly team conferences will be held on Wednesday to discuss your progress.  Your Social Worker will talk with you frequently to get your input and to update you on team discussions.  Team conferences with you and your family in attendance may also be held.  Expected length of stay: 12-14 days  Overall anticipated outcome: supervision/min w/c level  Depending on your progress and recovery, your program may change. Your Social Worker will coordinate services and will keep you informed of any changes. Your Child psychotherapist names and contact numbers are listed  below.  The following services may also be recommended but are not provided by the [facility]:  Home Health Rehabiltiation Services  Outpatient Rehabilitatation Services   Arrangements will be made to provide these services after discharge if needed.  Arrangements include referral to agencies that provide these services.  Your insurance has been verified to be:  Medicare & BCBS Your primary doctor is:  Dr Oneal Grout  Pertinent information will be shared with your doctor and your insurance company.  Social Worker:  Dossie Der, [place] [phone]  Information discussed  with and copy given to patient by: Lucy Chris, 08/14/2012, 10:29 AM

## 2012-08-14 NOTE — Progress Notes (Signed)
Physical Therapy Session Note  Patient Details  Name: Aaron Long MRN: [id number] Date of Birth: October 24, 1940  Today's Date: 08/14/2012 Time:  8:35-9:30 ( ) and 11:30-12:00 ( )    Short Term Goals: Week 1:  PT Short Term Goal 1 (Week 1): Pt will required S for rolling and supine to sit, sit to supine without use of siderail and head of bed flat.  PT Short Term Goal 2 (Week 1): Pt will demonstrate transfs w/c to bed, bed to w/c with LRAD and supervision.  PT Short Term Goal 3 (Week 1): Pt will propel w/c greater than 150 feet indepdently.  PT Short Term Goal 4 (Week 1): Pt will ambulate with rolling walker and mod Ax1, with L prosthesis ~ 15 feet.  PT Short Term Goal 5 (Week 1): Pt will increase R LE strength to at least 3+/5 throughout.   Skilled Therapeutic Interventions/Progress Updates:  Pt resting in bed, unaware that he had therapies today, somewhat confused about which room he's in. Reoriented appropriately.  Supine>sit with S and increased time.  Seated dynamic balance EOB reaching/placing objects, donning prosthesis with close S and feet unsupported.  Sliding board transfer bed>WC with Min A and cues for technique.  WC management training with parts and room navigation with Min A and increased fatigue.  Toilet transfer with grab bars and Mod A. Dynamic sitting balance with close S and 1 foot supported.  WC navigation in controlled environment x40' with Min A for turning.   Second tx Pt resting in bed, fatigued from day's therapies.  Supine>sit with S. Bed>WC with sliding board and Min A.  WCx40' in controlled environment (limited by fatigue)  Seated therex for LE strength and ROM: bil LAQ, marching 2x10 each Sit<>Stand with Min A in // bars.  Pt stood x6min then walked x3' with mod A forward, then backward with cues for optimal foot placement.      Therapy Documentation Precautions:  Precautions Precautions: Fall;Other (comment) (B BKA) Precaution Comments: LE  precautions Restrictions Weight Bearing Restrictions: Yes RLE Weight Bearing:  (amputated leg)    Pain: 8/10 incisional pain, repositioned, modified tx, and notified nurse  Other Treatments:    See FIM for current functional status  Therapy/Group: Individual Therapy  Clydene Laming, PT, DPT  08/14/2012, 9:17 AM

## 2012-08-14 NOTE — Progress Notes (Signed)
Patient information reviewed and entered into eRehab system by Elfie Costanza, RN, CRRN, PPS Coordinator.  Information including medical coding and functional independence measure will be reviewed and updated through discharge.     Per nursing patient was given "Data Collection Information Summary for Patients in Inpatient Rehabilitation Facilities with attached "Privacy Act Statement-Health Care Records" upon admission.  

## 2012-08-14 NOTE — Progress Notes (Signed)
Occupational Therapy Session Note  Patient Details  Name: Aaron Long MRN: [id number] Date of Birth: Sep 25, 1940  Today's Date: 08/14/2012 Time: 1340-1410 Time Calculation (min): 30 min (missed 15 min due to "pain medication caused tired, dizzy and unable to think clearly")  Short Term Goals: Week 1:  OT Short Term Goal 1 (Week 1): Pt. will do lateral leans for pericare with SBA OT Short Term Goal 2 (Week 1): Pt. will transfer to toilet with min assist OT Short Term Goal 3 (Week 1): Pt. will stand  at sink for 30 seconds with min assist for balance.    Skilled Therapeutic Interventions/Progress Updates:  Patient resting in w/c with wife at his bedside.  Patient reports not feeling well and would like to end session in the bed.  Focused on w/c>drop arm commode transfer, toileting, and DRC>bed transfer.  Patient had some difficulty with problem solving related to optimal w/c position for safe transfer and hand placement/technique for scoot transfer secondary to patient declined to use the slideboard.  Attempted to engage patient's wife who primarily worked on the computer and occasionally observed.  Patient practiced lateral leans for clothing management and was supervision for pants down them min assist for pants up.  Patient declared that he could not finish session due to dizziness and feeling that he could not think clearly and would probably not be able to recall what he learned in this session.  Patient was left in bed with all items within reach and wife at bedside.  Therapy Documentation Precautions:  Precautions Precautions: Fall;Other (comment) (B BKA) Precaution Comments: LE precautions Restrictions Weight Bearing Restrictions: Yes RLE Weight Bearing:  (amputated leg) Pain: 2/10 right residual limb, did not c/o pain and does not want any more pain medication due to its adverse effects. See FIM for current functional status  Therapy/Group: Individual Therapy  Jazon Jipson,  Quida Glasser 08/14/2012, 4:43 PM

## 2012-08-14 NOTE — Progress Notes (Signed)
Subjective/Complaints: Feels well. Head is "clear". Did not remember me from Friday but recalled me from last admit A 12 point review of systems has been performed and if not noted above is otherwise negative.   Objective: Vital Signs: Blood pressure 186/80, pulse 87, temperature 97.9 F (36.6 C), temperature source Oral, resp. rate 20, height 5\' 8"  (1.727 m), weight 84.2 kg (185 lb 10 oz), SpO2 99.00%. No results found.  Recent Labs  08/11/12 1633 08/14/12 0605  WBC 10.4 6.6  HGB 7.9* 7.6*  HCT 24.3* 23.0*  PLT 153 229    Recent Labs  08/11/12 1633 08/14/12 0605  NA  --  137  K  --  4.7  CL  --  101  GLUCOSE  --  137*  BUN  --  35*  CREATININE 2.56* 2.49*  CALCIUM  --  9.2   CBG (last 3)   Recent Labs  08/13/12 1630 08/13/12 2115 08/14/12 0730  GLUCAP 150* 121* 146*    Wt Readings from Last 3 Encounters:  08/11/12 84.2 kg (185 lb 10 oz)  08/07/12 96.163 kg (212 lb)  08/07/12 96.163 kg (212 lb)    Physical Exam:  HENT: oral mucosa pink and moist  Head: Normocephalic. Dentition fair  Eyes:  Pupils reactive to light  Neck: Normal range of motion. Neck supple. No thyromegaly present.  Cardiovascular: Normal rate and regular rhythm. No murmur or rub  Pulmonary/Chest: Effort normal and breath sounds normal. No respiratory distress. No wheezes rales or rhonchi  Abdominal: Soft. Bowel sounds are normal. He exhibits no distension.  Neurological: He is alert.  Patient alert and Oriented x 4. He was able to participate with exam. UE's 5/5. RLE is 2/5(pain). LLE is 4-5/5. No gross sensory changes.  Skin:  Left BKA is healed. Right BKA is intact with significant swelling. Very tender , not covered Neuro:  Eyes without evidence of nystagmus    Assessment/Plan: 1. Functional deficits secondary to right BKA (prior left BKA) which require 3+ hours per day of interdisciplinary therapy in a comprehensive inpatient rehab setting. Physiatrist is providing close team  supervision and 24 hour management of active medical problems listed below. Physiatrist and rehab team continue to assess barriers to discharge/monitor patient progress toward functional and medical goals. FIM: FIM - Bathing Bathing Steps Patient Completed: Chest;Right Arm;Left Arm;Abdomen;Front perineal area;Right upper leg;Left upper leg Bathing: 0: Activity did not occur  FIM - Upper Body Dressing/Undressing Upper body dressing/undressing steps patient completed: Thread/unthread right sleeve of pullover shirt/dresss;Thread/unthread left sleeve of pullover shirt/dress;Put head through opening of pull over shirt/dress;Pull shirt over trunk Upper body dressing/undressing: 0: Activity did not occur FIM - Lower Body Dressing/Undressing Lower body dressing/undressing steps patient completed: Thread/unthread right pants leg;Thread/unthread left pants leg Lower body dressing/undressing: 0: Activity did not occur  FIM - Toileting Toileting steps completed by patient: Performs perineal hygiene (Wearing gown) Toileting: 2: Max-Patient completed 1 of 3 steps  FIM - Diplomatic Services operational officer Devices: Grab bars;Prosthesis Toilet Transfers: 3-To toilet/BSC: Mod A (lift or lower assist);3-From toilet/BSC: Mod A (lift or lower assist)  FIM - Bed/Chair Transfer Bed/Chair Transfer Assistive Devices: Arm rests;Sliding board;Prosthesis Bed/Chair Transfer: 5: Supine > Sit: Supervision (verbal cues/safety issues);4: Bed > Chair or W/C: Min A (steadying Pt. > 75%)  FIM - Locomotion: Wheelchair Distance: 40 Locomotion: Wheelchair: 1: Travels less than 50 ft with minimal assistance (Pt.>75%) FIM - Locomotion: Ambulation Ambulation/Gait Assistance: Not tested (comment) Locomotion: Ambulation: 0: Activity did not occur  Comprehension  Comprehension Mode: Auditory Comprehension: 3-Understands basic 50 - 74% of the time/requires cueing 25 - 50%  of the time  Expression Expression Mode:  Verbal Expression: 3-Expresses basic 50 - 74% of the time/requires cueing 25 - 50% of the time. Needs to repeat parts of sentences.  Social Interaction Social Interaction: 4-Interacts appropriately 75 - 89% of the time - Needs redirection for appropriate language or to initiate interaction.  Problem Solving Problem Solving: 2-Solves basic 25 - 49% of the time - needs direction more than half the time to initiate, plan or complete simple activities  Memory Memory: 4-Recognizes or recalls 75 - 89% of the time/requires cueing 10 - 24% of the time Medical Problem List and Plan:  1. Right BKA 08/08/2012/history of left BKA  -has prosthesis with him  -ACE wrap, dressing for right residual limb--needs this on! 2. DVT Prophylaxis/Anticoagulation: Subcutaneous Lovenox. Monitor platelet counts  3. Pain Management: Hydrocodone prn. Monitor mental status (see below)  4. Mood/postoperative confusion. Limited narcotics and monitor with increased mobility-  -pt feeling that he's close to baseline.   5. Neuropsych: This patient is capable of making decisions on his own behalf.  6. Acute blood loss anemia. Followup CBC and monitor for any bleeding episodes. Wound without drainage at present  -recheck wednesday (further drop today)  7. Chronic renal insufficiency. Baseline creatinine 2.6-2.9. Followup chemistries consistent 8. Diabetes mellitus with peripheral neuropathy. Hemoglobin A1c 6.8. Levemir 18 units daily. Check blood sugars a.c. and at bedtime. Adjust regimen as needed moving forward.  9. Hypertension. Norvasc 5 mg daily, Cardura 0.5 mg each bedtime. Monitor with increased mobility  10. Hyperlipidemia. Lipitor  12. Coronary artery disease with history of myocardial infarction. Continue Plavix/aspirin. No complaints of chest pain   LOS (Days) 3 A FACE TO FACE EVALUATION WAS PERFORMED  Anamaria Dusenbury T 08/14/2012 10:04 AM

## 2012-08-14 NOTE — Progress Notes (Signed)
Social Work Assessment and Plan Social Work Assessment and Plan  Patient Details  Name: Aaron Long MRN: [id number] Date of Birth: May 22, 1940  Today's Date: 08/14/2012  Problem List:  Patient Active Problem List   Diagnosis Date Noted  . Hx of BKA 08/14/2012  . Aftercare following surgery of the circulatory system, NEC 08/03/2012  . Type I (juvenile type) diabetes mellitus with peripheral circulatory disorders, not stated as uncontrolled(250.71) 07/24/2012  . Anemia in chronic kidney disease(285.21) 07/24/2012  . Diabetes mellitus type 2, controlled, with complications 06/28/2012  . Fever and chills 06/19/2012  . Cellulitis of foot, right 06/19/2012  . Anemia 06/19/2012  . Atherosclerosis of native arteries of the extremities with ulceration(440.23) 06/01/2012  . Diabetic osteomyelitis 05/23/2012  . Hyperkalemia 04/24/2012  . Peripheral vascular disease, unspecified 01/27/2012  . Diabetic neuropathy, type II diabetes mellitus 07/27/2011  . Atherosclerosis of native arteries of the extremities with intermittent claudication 07/08/2011  . S/P BKA (below knee amputation) 06/22/2011  . Atherosclerosis of native arteries of the extremities with gangrene 06/10/2011  . Pre-operative cardiovascular examination 06/01/2011  . Diabetic toe ulcer 05/20/2011  . Acute kidney injury 05/02/2011  . CKD (chronic kidney disease) stage 4, GFR 15-29 ml/min 05/02/2011  . Cellulitis 05/02/2011  . Gangrene due to secondary diabetes mellitus 04/29/2011  . Diabetic foot ulcer associated with type 2 diabetes mellitus 04/28/2011  . HTN (hypertension) 04/28/2011  . CAD (coronary artery disease) 04/28/2011  . PAD (peripheral artery disease) 04/28/2011  . Prostate cancer 04/28/2011   Past Medical History:  Past Medical History  Diagnosis Date  . Prostate cancer 2010  . PAD (peripheral artery disease)   . Gangrene of toe     dry  . Neuromuscular disorder     diabetic neuropathy  . Hypertension      takes Amlodipine daily and Metoprolol bid  . Hyperlipidemia     takes Pravastatin daily  . Myocardial infarction 2005  . Peripheral neuropathy   . Constipation     takes Miralax daily  . Hemorrhoids   . Hx of colonic polyps   . History of kidney stones   . Diabetes mellitus     Novolog and Levemir daily  . Coronary artery disease   . BPH (benign prostatic hypertrophy)   . Abnormality of gait   . Coronary atherosclerosis of native coronary artery   . Phlebitis and thrombophlebitis of other deep vessels of lower extremities   . Chronic kidney disease   . Hypertrophy of prostate without urinary obstruction and other lower urinary tract symptoms (LUTS)   . Disorder of bone and cartilage, unspecified   . Lower limb amputation, below knee   . Long term (current) use of anticoagulants   . Anemia 06/19/2012   Past Surgical History:  Past Surgical History  Procedure Laterality Date  . Prostate surgery  2009    prostatectomy  . Knee cartilage surgery  at age 5    left knee  . Cea      Right  . Coronary artery bypass graft  2005    x 4  . Carotid endarterectomy  2005    right  . Cataract surgery  2011    bilateral  . Colonoscopy    . Cardiac catheterization  05/28/11  . Amputation  06/14/2011    Procedure: AMPUTATION DIGIT;  Surgeon: Sherren Kerns, MD;  Location: [facility] OR;  Service: Vascular;  Laterality: Left;  Amputation Left fifth toe  . Amputation  06/16/2011  Procedure: AMPUTATION BELOW KNEE;  Surgeon: Sherren Kerns, MD;  Location: [facility] OR;  Service: Vascular;  Laterality: Left;  . I&d extremity  01/31/2012    Procedure: IRRIGATION AND DEBRIDEMENT EXTREMITY;  Surgeon: Sherren Kerns, MD;  Location: [facility] OR;  Service: Vascular;  Laterality: Left;  I & D Left BKA   . Amputation Right 06/12/2012    Procedure: AMPUTATION DIGIT;  Surgeon: Sherren Kerns, MD;  Location: [facility] OR;  Service: Vascular;  Laterality: Right;  GREAT TOE  . Below knee leg amputation Right 08/08/2012    Dr  Darrick Penna  . Amputation Right 08/08/2012    Procedure: AMPUTATION BELOW KNEE;  Surgeon: Sherren Kerns, MD;  Location: [facility] OR;  Service: Vascular;  Laterality: Right;   Social History:  reports that he quit smoking about 21 years ago. His smoking use included Cigarettes. He has a 40 pack-year smoking history. He has never used smokeless tobacco. He reports that he does not drink alcohol or use illicit drugs.  Family / Support Systems Marital Status: Married How Long?: 20 years Patient Roles: Spouse;Parent Spouse/Significant Other: sandra  [phone]-cell Children: Brandon-son  [phone]-cell Other Supports: Pamela-daughter  [phone] Anticipated Caregiver: Wife Ability/Limitations of Caregiver: Wife has health issues so needs to monitor herself.  She provided assistance to pt last year when he had his other amputation Caregiver Availability: 24/7 Family Dynamics: Close knit family two of the three children are local and involved.  They also have extended family here.  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 Recent Psychosocial Issues: Last June his other  amputation and rehabiliatation from that.  He feels if he did last time he can do it again.  Tries to stay positive and move forward. Pyschiatric History: No history deferred depression screen due to pt feels he is doing ok with all of this and knows waht to expect.  He states: " I am not afraid to ask fo help if I need it, but will pray to God for guidance." Substance Abuse History: No issues  Patient / Family Perceptions, Expectations & Goals Pt/Family understanding of illness & functional limitations: Pt and wife have a good understanding of his amputation and the process he has in front of him.  Both wish it didn't have to be but will move forward and focus on healing.  Pt reports he had more time to process this one, it gave him time to think, process and get right about it. Premorbid pt/family roles/activities: HUsband, Father, grandfather, Retiree, Church member, etc Anticipated changes in roles/activities/participation: Pt plans to reusme these roles Pt/family expectations/goals: Pt states: " I want to do as well as I did last year, this one is easier than the last."  Wife  states: " I am hopeful he will doa s well as he did last time, but know this is different."  [employer]: Other (Comment) (In past) Premorbid Home Care/DME Agencies: Other (Comment) (Had In past) Transportation available at discharge: Wife and Technical sales engineer referrals recommended: Support group (specify) (Amputee Support Group)  Discharge Planning Living Arrangements: Spouse/significant other Support Systems: Spouse/significant other;Children;Other relatives;Friends/neighbors;Church/faith community Type of Residence: Private residence Insurance Resources: Administrator (specify) Financial Resources: Restaurant manager, fast food Screen Referred: No Living Expenses: Lives with family Money Management: Spouse;Patient Does the patient have any problems obtaining your  medications?: No Home Management: Wife Patient/Family Preliminary Plans: Return home with wife assisting him, she did last year with other amputation.  Prepared to begin with wheelchair level and hopefully progress to walking some.  Wife plans to be here and observe in therpaies like last time. Social Work Anticipated Follow Up Needs: HH/OP;Support Group Expected length of stay: one week  Clinical Impression; This gentleman is well known to this worker from previous admit.  Very supportive wife who is hands-on and pt is motivated to improve and will work hard to achieve his goals. Appears to be coping appropriately, is aware of resources available    Lucy Chris 08/14/2012, 10:16 AM

## 2012-08-15 ENCOUNTER — Inpatient Hospital Stay (HOSPITAL_COMMUNITY): Payer: Medicare Other | Admitting: Physical Therapy

## 2012-08-15 ENCOUNTER — Inpatient Hospital Stay (HOSPITAL_COMMUNITY): Payer: Medicare Other | Admitting: Occupational Therapy

## 2012-08-15 ENCOUNTER — Inpatient Hospital Stay (HOSPITAL_COMMUNITY): Payer: Medicare Other | Admitting: *Deleted

## 2012-08-15 LAB — GLUCOSE, CAPILLARY
Glucose-Capillary: 147 mg/dL — ABNORMAL HIGH (ref 70–99)
Glucose-Capillary: 148 mg/dL — ABNORMAL HIGH (ref 70–99)
Glucose-Capillary: 188 mg/dL — ABNORMAL HIGH (ref 70–99)

## 2012-08-15 NOTE — Plan of Care (Signed)
Problem: RH PAIN MANAGEMENT Goal: RH STG PAIN MANAGED AT OR BELOW PT'S PAIN GOAL Less than 2  Outcome: Progressing Pain controlled with Tylenol at this time.

## 2012-08-15 NOTE — Progress Notes (Signed)
Physical Therapy Session Note  Patient Details  Name: Aaron Long MRN: [id number] Date of Birth: Jun 13, 1940  Today's Date: 08/15/2012 Time: 0805-0905 Time Calculation (min): 60 min  Short Term Goals: Week 1:  PT Short Term Goal 1 (Week 1): Pt will required S for rolling and supine to sit, sit to supine without use of siderail and head of bed flat.  PT Short Term Goal 2 (Week 1): Pt will demonstrate transfs w/c to bed, bed to w/c with LRAD and supervision.  PT Short Term Goal 3 (Week 1): Pt will propel w/c greater than 150 feet indepdently.  PT Short Term Goal 4 (Week 1): Pt will ambulate with rolling walker and mod Ax1, with L prosthesis ~ 15 feet.  PT Short Term Goal 5 (Week 1): Pt will increase R LE strength to at least 3+/5 throughout.   Skilled Therapeutic Interventions/Progress Updates:    Pt received sitting EOB completing breakfast.  Pt reports 4/10 pain score L LE and states nsg is aware and received tylenol for pain as stronger pain medications make him feel sleepy. Pt performed squat pivot transfer with min/mod assist requiring vcs for hand and foot placement and sequencing and sliding board transfer bed<>w/c with min assist. Pt states he prefers to transfer without sliding board but demonstrates increased proficiency  with use of sliding board. Wheelchair mobility in and around room with pt requiring increased assistance with tight spaces and to navigate around objects. W/c<>toilet transfer with use of grab bars and mod/max assist as pt is unable to come to upright standing, requires mod vcs and tactile cues for proper sequencing and hand and foot placement, pt required max assist with clothing and continence management products and max assist to clean around bowel movement. Pt reported fatigue, demonstrating fair minus activity tolerance. Pt performed wheelchair mobility and level and carpeted surfaces with supervision assist, slight increased assistance on carpet as pt reported  fatigue.   Therapy Documentation Precautions:  Precautions Precautions: Fall;Other (comment) Precaution Comments: LE precautions Restrictions Weight Bearing Restrictions: Yes RLE Weight Bearing:  (amputated leg)       Pain: Pain Assessment Pain Assessment: No/denies pain Pain Score:4/10 L LE Mobility:   Locomotion : Ambulation Ambulation/Gait Assistance: Not tested (comment) Wheelchair Mobility Wheelchair Mobility: Yes Wheelchair Assistance: 5: Supervision Wheelchair Assistance Details: Verbal cues for precautions/safety;Visual cues/gestures for precautions/safety Wheelchair Propulsion: Both upper extremities Wheelchair Parts Management: Needs assistance Distance: 160                See FIM for current functional status  Therapy/Group: Individual Therapy  Jackelyn Knife 08/15/2012, 12:23 PM

## 2012-08-15 NOTE — Progress Notes (Signed)
Subjective/Complaints: Having pain at times. Has to be careful with amount of medication as too much makes him a "woozy" A 12 point review of systems has been performed and if not noted above is otherwise negative.   Objective: Vital Signs: Blood pressure 125/66, pulse 90, temperature 98.7 F (37.1 C), temperature source Oral, resp. rate 19, height 5\' 8"  (1.727 m), weight 84.2 kg (185 lb 10 oz), SpO2 96.00%. No results found.  Recent Labs  08/14/12 0605  WBC 6.6  HGB 7.6*  HCT 23.0*  PLT 229    Recent Labs  08/14/12 0605  NA 137  K 4.7  CL 101  GLUCOSE 137*  BUN 35*  CREATININE 2.49*  CALCIUM 9.2   CBG (last 3)   Recent Labs  08/14/12 1646 08/14/12 2120 08/15/12 0739  GLUCAP 117* 164* 147*    Wt Readings from Last 3 Encounters:  08/11/12 84.2 kg (185 lb 10 oz)  08/07/12 96.163 kg (212 lb)  08/07/12 96.163 kg (212 lb)    Physical Exam:  HENT: oral mucosa pink and moist  Head: Normocephalic. Dentition fair  Eyes:  Pupils reactive to light  Neck: Normal range of motion. Neck supple. No thyromegaly present.  Cardiovascular: Normal rate and regular rhythm. No murmur or rub  Pulmonary/Chest: Effort normal and breath sounds normal. No respiratory distress. No wheezes rales or rhonchi  Abdominal: Soft. Bowel sounds are normal. He exhibits no distension.  Neurological: He is alert.  Patient alert and Oriented x 4. He was able to participate with exam. UE's 5/5. RLE is 2/5(pain). LLE is 4-5/5. No gross sensory changes.  Skin:  Left BKA is healed. Right BKA is intact with significant swelling. Very tender , not covered Neuro:  Eyes without evidence of nystagmus    Assessment/Plan: 1. Functional deficits secondary to right BKA (prior left BKA) which require 3+ hours per day of interdisciplinary therapy in a comprehensive inpatient rehab setting. Physiatrist is providing close team supervision and 24 hour management of active medical problems listed  below. Physiatrist and rehab team continue to assess barriers to discharge/monitor patient progress toward functional and medical goals. FIM: FIM - Bathing Bathing Steps Patient Completed: Chest;Right Arm;Left Arm;Abdomen;Front perineal area;Buttocks;Right upper leg;Left upper leg Bathing: 4: Steadying assist  FIM - Upper Body Dressing/Undressing Upper body dressing/undressing steps patient completed: Thread/unthread right sleeve of pullover shirt/dresss;Thread/unthread left sleeve of pullover shirt/dress;Put head through opening of pull over shirt/dress;Pull shirt over trunk Upper body dressing/undressing: 5: Set-up assist to: Obtain clothing/put away FIM - Lower Body Dressing/Undressing Lower body dressing/undressing steps patient completed: Thread/unthread right pants leg;Thread/unthread left pants leg Lower body dressing/undressing: 3: Mod-Patient completed 50-74% of tasks  FIM - Toileting Toileting steps completed by patient: Adjust clothing prior to toileting (practiced pants down and up no BM occured) Toileting:  (assist to perform pants back up)  FIM - Diplomatic Services operational officer Devices: Bedside commode (drop arm commode) Toilet Transfers: 3-To toilet/BSC: Mod A (lift or lower assist);3-From toilet/BSC: Mod A (lift or lower assist)  FIM - Bed/Chair Transfer Bed/Chair Transfer Assistive Devices: Arm rests;Sliding board;Prosthesis Bed/Chair Transfer: 5: Supine > Sit: Supervision (verbal cues/safety issues);4: Bed > Chair or W/C: Min A (steadying Pt. > 75%)  FIM - Locomotion: Wheelchair Distance: 40 Locomotion: Wheelchair: 1: Travels less than 50 ft with minimal assistance (Pt.>75%) FIM - Locomotion: Ambulation Locomotion: Ambulation Assistive Devices: Parallel bars Ambulation/Gait Assistance: 3: Mod assist Locomotion: Ambulation: 2: Travels 50 - 149 ft with moderate assistance (Pt: 50 - 74%)  Comprehension Comprehension Mode: Auditory Comprehension:  3-Understands basic 50 - 74% of the time/requires cueing 25 - 50%  of the time  Expression Expression Mode: Verbal Expression: 3-Expresses basic 50 - 74% of the time/requires cueing 25 - 50% of the time. Needs to repeat parts of sentences.  Social Interaction Social Interaction: 4-Interacts appropriately 75 - 89% of the time - Needs redirection for appropriate language or to initiate interaction.  Problem Solving Problem Solving: 2-Solves basic 25 - 49% of the time - needs direction more than half the time to initiate, plan or complete simple activities  Memory Memory: 4-Recognizes or recalls 75 - 89% of the time/requires cueing 10 - 24% of the time Medical Problem List and Plan:  1. Right BKA 08/08/2012/history of left BKA  -has prosthesis with him  -ACE wrap, dressing for right residual limb 2. DVT Prophylaxis/Anticoagulation: Subcutaneous Lovenox. Monitor platelet counts  3. Pain Management: Hydrocodone prn. Monitor mental status (see below)  4. Mood/postoperative confusion. Limited narcotics and monitor with increased mobility-  -pt feeling that he's close to baseline.   5. Neuropsych: This patient is capable of making decisions on his own behalf.  6. Acute blood loss anemia. Followup CBC and monitor for any bleeding episodes. Wound remains without drainage at present  -recheck wednesday  7. Chronic renal insufficiency. Baseline creatinine 2.6-2.9. Followup chemistries consistent 8. Diabetes mellitus with peripheral neuropathy. Hemoglobin A1c 6.8. Levemir 18 units daily. Check blood sugars a.c. and at bedtime. Adjust regimen as needed moving forward.  9. Hypertension. Norvasc 5 mg daily, Cardura 0.5 mg each bedtime. Monitor with increased mobility  10. Hyperlipidemia. Lipitor  12. Coronary artery disease with history of myocardial infarction. Continue Plavix/aspirin. No complaints of chest pain   LOS (Days) 4 A FACE TO FACE EVALUATION WAS PERFORMED  Fredi Hurtado T 08/15/2012  9:28 AM

## 2012-08-15 NOTE — Progress Notes (Addendum)
Occupational Therapy Session Note  Patient Details  Name: Aaron Long MRN: [id number] Date of Birth: [birth year]  Today's Date: 08/15/2012 Time: 1000-1100 Time Calculation (min): 60 min  Short Term Goals: Week 1:  OT Short Term Goal 1 (Week 1): Pt. will do lateral leans for pericare with SBA OT Short Term Goal 2 (Week 1): Pt. will transfer to toilet with min assist OT Short Term Goal 3 (Week 1): Pt. will stand  at sink for 30 seconds with min assist for balance.    Skilled Therapeutic Interventions/Progress Updates:  Patient seated in w/c upon arrival and elected to perform sponge bath and dressing in w/c at sink.  Engaged in bath, dress and groom with focus on lateral leans for bathing buttocks and for pants down and up.  Patient able to lift hips off w/c cushion with BUEs to allow this clinician to remove soiled adult diaper and he lifted again for clean diaper to be pulled up to waist. Attempted to stand at sink with left LE prosthetic on however, patient unable to get enough lift.  Patient left with all item in reach and seated in w/c.  Therapy Documentation Precautions:  Precautions Precautions: Fall;Other (comment) Precaution Comments: LE precautions Restrictions Weight Bearing Restrictions: Yes RLE Weight Bearing:  (amputated leg) Pain: Pain Assessment Pain Assessment: No/denies pain Pain Score: 0-No pain ADL: See FIM for current functional status  Therapy/Group: Individual Therapy  Nadeen Shipman 08/15/2012, 12:26 PM

## 2012-08-15 NOTE — Progress Notes (Signed)
Occupational Therapy Session Note  Patient Details  Name: Aaron Long MRN: [id number] Date of Birth: [birth year]  Today's Date: 08/15/2012 Time: 7829-5621 Time Calculation (min): 40 min  Short Term Goals: Week 1:  OT Short Term Goal 1 (Week 1): Pt. will do lateral leans for pericare with SBA OT Short Term Goal 2 (Week 1): Pt. will transfer to toilet with min assist OT Short Term Goal 3 (Week 1): Pt. will stand  at sink for 30 seconds with min assist for balance.    Skilled Therapeutic Interventions/Progress Updates:    Pt seen for 1:1 OT with focus on sit <> stand, lateral leans, and transfers.  Pt in bed upon arrival visiting with friend.  Pt willing to participate in treatment session, but requested to remain in room.  Engaged in sit <> stand from elevated bed with focus on weight shifting forward and maintaining standing position.  Pt able to maintain standing ~10-15 seconds before needing to sit. Completed sit <> stand x3 with w/c handles in front with pt pushing up from bed with Rt hand and then steadying with BUE on handles.  Pt reports discomfort in LLE (prosthesis) with prolonged standing due to all weight through one leg.  Completed bed <> w/c transfers with pt choosing to complete scoot pivot without slide board.  Steady assist bed to w/c and min-mod assist w/c to bed secondary to going uphill.  Discussed pt's bed at home (which is higher than hospital bed) and had pt perform transfer to elevated bed with mod assist.    Therapy Documentation Precautions:  Precautions Precautions: Fall;Other (comment) Precaution Comments: LE precautions Restrictions Weight Bearing Restrictions: Yes RLE Weight Bearing:  (amputated leg) Pain: Pain Assessment Pain Assessment: No/denies pain Pain Score: 0-No pain  See FIM for current functional status  Therapy/Group: Individual Therapy  Rosalio Loud 08/15/2012, 2:41 PM

## 2012-08-15 NOTE — Plan of Care (Signed)
Problem: RH SAFETY Goal: RH STG ADHERE TO SAFETY PRECAUTIONS W/ASSISTANCE/DEVICE STG Adhere to Safety Precautions With Assistance/Device.  Outcome: Progressing Calls appropriately for assistance.

## 2012-08-15 NOTE — Progress Notes (Signed)
Physical Therapy Session Note  Patient Details  Name: Aaron Long MRN: [id number] Date of Birth: Dec 27, 1940  Today's Date: 08/15/2012 Time: 1130-1156 Time Calculation (min): 26 min  Short Term Goals: Week 1:  PT Short Term Goal 1 (Week 1): Pt will required S for rolling and supine to sit, sit to supine without use of siderail and head of bed flat.  PT Short Term Goal 2 (Week 1): Pt will demonstrate transfs w/c to bed, bed to w/c with LRAD and supervision.  PT Short Term Goal 3 (Week 1): Pt will propel w/c greater than 150 feet indepdently.  PT Short Term Goal 4 (Week 1): Pt will ambulate with rolling walker and mod Ax1, with L prosthesis ~ 15 feet.  PT Short Term Goal 5 (Week 1): Pt will increase R LE strength to at least 3+/5 throughout.   Skilled Therapeutic Interventions/Progress Updates:    Patient received sitting in wheelchair. Session focused on wheelchair mobility and UE strengthening. Patient performed wheelchair mobility 175' x1, 150'x1 on tile surface, several small bouts in ADL apartment on carpet and in confined spaces. Patient able to negotiate bed and dresser, over threshold into bathroom, and turn 180 degrees in bathroom with supervision and increased time. Patient performed 3 sets x10 reps wheelchair push ups to facilitate increased UE strength and pressure relief. Patient educated about benefits of wheelchair push ups for use with pressure relief.  Patient returned to room and left seated in wheelchair with all needs within reach.  Therapy Documentation Precautions:  Precautions Precautions: Fall;Other (comment) Precaution Comments: LE precautions Restrictions Weight Bearing Restrictions: Yes RLE Weight Bearing:  (amputated leg) Pain: Pain Assessment Pain Assessment: No/denies pain Pain Score: 0-No pain Locomotion : Ambulation Ambulation/Gait Assistance: Not tested (comment) Wheelchair Mobility Wheelchair Mobility: Yes Wheelchair Assistance: 5:  Supervision Wheelchair Assistance Details: Verbal cues for precautions/safety;Visual cues/gestures for precautions/safety Wheelchair Propulsion: Both upper extremities Wheelchair Parts Management: Needs assistance Distance: 175   See FIM for current functional status  Therapy/Group: Individual Therapy  Chipper Herb. Gianah Batt, PT, DPT 08/15/2012, 12:11 PM

## 2012-08-16 ENCOUNTER — Inpatient Hospital Stay (HOSPITAL_COMMUNITY): Payer: Medicare Other | Admitting: Occupational Therapy

## 2012-08-16 ENCOUNTER — Inpatient Hospital Stay (HOSPITAL_COMMUNITY): Payer: BLUE CROSS/BLUE SHIELD | Admitting: Occupational Therapy

## 2012-08-16 ENCOUNTER — Inpatient Hospital Stay (HOSPITAL_COMMUNITY): Payer: Medicare Other

## 2012-08-16 DIAGNOSIS — N186 End stage renal disease: Secondary | ICD-10-CM

## 2012-08-16 DIAGNOSIS — L98499 Non-pressure chronic ulcer of skin of other sites with unspecified severity: Secondary | ICD-10-CM

## 2012-08-16 DIAGNOSIS — I739 Peripheral vascular disease, unspecified: Secondary | ICD-10-CM

## 2012-08-16 DIAGNOSIS — S88119A Complete traumatic amputation at level between knee and ankle, unspecified lower leg, initial encounter: Secondary | ICD-10-CM

## 2012-08-16 DIAGNOSIS — E1165 Type 2 diabetes mellitus with hyperglycemia: Secondary | ICD-10-CM

## 2012-08-16 LAB — GLUCOSE, CAPILLARY
Glucose-Capillary: 129 mg/dL — ABNORMAL HIGH (ref 70–99)
Glucose-Capillary: 146 mg/dL — ABNORMAL HIGH (ref 70–99)
Glucose-Capillary: 118 mg/dL — ABNORMAL HIGH (ref 70–99)

## 2012-08-16 LAB — CBC
Hemoglobin: 8.3 g/dL — ABNORMAL LOW (ref 13.0–17.0)
MCH: 28.3 pg (ref 26.0–34.0)
RBC: 2.93 MIL/uL — ABNORMAL LOW (ref 4.22–5.81)

## 2012-08-16 NOTE — Patient Care Conference (Signed)
Inpatient RehabilitationTeam Conference and Plan of Care Update Date: 08/16/2012   Time: 10:40AM    Patient Name: Aaron Long      Medical Record Number: [id number]  Date of Birth: [birth year] Sex: Male         Room/Bed: 4W18C/4W18C-01 Payor Info: Payor: MEDICARE / Plan: MEDICARE PART A AND B / Product Type: *No Product type* /    Admitting Diagnosis: RT BKA  OLD LT BKA  Admit Date/Time:  08/11/2012  3:46 PM Admission Comments: No comment available   Primary Diagnosis:  Hx of BKA Principal Problem: Hx of BKA  Patient Active Problem List   Diagnosis Date Noted  . Hx of BKA 08/14/2012  . Aftercare following surgery of the circulatory system, NEC 08/03/2012  . Type I (juvenile type) diabetes mellitus with peripheral circulatory disorders, not stated as uncontrolled(250.71) 07/24/2012  . Anemia in chronic kidney disease(285.21) 07/24/2012  . Diabetes mellitus type 2, controlled, with complications 06/28/2012  . Fever and chills 06/19/2012  . Cellulitis of foot, right 06/19/2012  . Anemia 06/19/2012  . Atherosclerosis of native arteries of the extremities with ulceration(440.23) 06/01/2012  . Diabetic osteomyelitis 05/23/2012  . Hyperkalemia 04/24/2012  . Peripheral vascular disease, unspecified 01/27/2012  . Diabetic neuropathy, type II diabetes mellitus 07/27/2011  . Atherosclerosis of native arteries of the extremities with intermittent claudication 07/08/2011  . S/P BKA (below knee amputation) 06/22/2011  . Atherosclerosis of native arteries of the extremities with gangrene 06/10/2011  . Pre-operative cardiovascular examination 06/01/2011  . Diabetic toe ulcer 05/20/2011  . Acute kidney injury 05/02/2011  . CKD (chronic kidney disease) stage 4, GFR 15-29 ml/min 05/02/2011  . Cellulitis 05/02/2011  . Gangrene due to secondary diabetes mellitus 04/29/2011  . Diabetic foot ulcer associated with type 2 diabetes mellitus 04/28/2011  . HTN (hypertension) 04/28/2011  . CAD  (coronary artery disease) 04/28/2011  . PAD (peripheral artery disease) 04/28/2011  . Prostate cancer 04/28/2011    Expected Discharge Date: Expected Discharge Date: 08/21/12  Team Members Present: Physician leading conference: Dr. Faith Rogue Social Worker Present: Staci Acosta, LCSW;Becky Eboney Claybrook, LCSW Nurse Present: Ethelene Browns, RN PT Present: Edman Circle, PT;Bridgett Ripa, Clearnce Hasten, PT OT Present: Rosalio Loud, OT;Kris Gellert, Carollee Sires, OT SLP Present: Fae Pippin, Charlie Pitter, SLP     Current Status/Progress Goal Weekly Team Focus  Medical   right bka, having pain in new and old residual limbs  improve pain and cognition to allow oparticipation in therapies  safety, wound care, pain control   Bowel/Bladder   Continent of bowel and bladder. LBM 8/41/4  Pt to remain continent of bowel and bladder  Monitor   Swallow/Nutrition/ Hydration             ADL's   min-mod assist overall  supervision overall  ADL retraining, functional mobiltiy retraining, pt education, UE strengthening   Mobility   Min A sliding board transfers, 40' WC propulsion with S, 3' gait in // bars  S transfers, community WC mob, 25' gait with S and RW   LE strengthening and ROM/positioning, transfer training, WC propulsion and UE strength, car transfers   Communication             Safety/Cognition/ Behavioral Observations            Pain   Tylenol 650mg  prn. Vicodin x 1-2 prn q 4hrs  <3  Pain managed with Tylenol 650mg  at this time   Skin   R BKA with staples approximated, unremarkable, Shearing to  buttock  No additional skin breakdown  Monitor for appropriate healing      *See Care Plan and progress notes for long and short-term goals.  Barriers to Discharge: resistance to stand on left leg alone    Possible Resolutions to Barriers:  education, scoot transfers, pain control    Discharge Planning/Teaching Needs:  Home with wife who can assist, been  through this before      Team Discussion:  Pain in standing with prosthetic leg but is willing to try.  Off pain meds back to baseline-not confused now.  Scared to stand on leg. Wife to be in for family education  Revisions to Treatment Plan:  None   Continued Need for Acute Rehabilitation Level of Care: The patient requires daily medical management by a physician with specialized training in physical medicine and rehabilitation for the following conditions: Daily direction of a multidisciplinary physical rehabilitation program to ensure safe treatment while eliciting the highest outcome that is of practical value to the patient.: Yes Daily medical management of patient stability for increased activity during participation in an intensive rehabilitation regime.: Yes Daily analysis of laboratory values and/or radiology reports with any subsequent need for medication adjustment of medical intervention for : Post surgical problems;Cardiac problems (renal)  Merisa Julio, Lemar Livings 08/16/2012, 1:05 PM

## 2012-08-16 NOTE — Progress Notes (Signed)
Occupational Therapy Session Note  Patient Details  Name: Aaron Long MRN: [id number] Date of Birth: Oct 04, 1940  Today's Date: 08/16/2012 Time: 6578-4696 Time Calculation (min): 30 min  Short Term Goals: Week 1:  OT Short Term Goal 1 (Week 1): Pt. will do lateral leans for pericare with SBA OT Short Term Goal 2 (Week 1): Pt. will transfer to toilet with min assist OT Short Term Goal 3 (Week 1): Pt. will stand  at sink for 30 seconds with min assist for balance.    Skilled Therapeutic Interventions/Progress Updates:    Patient seen this pm for 1;1 OT session to address functional mobility as related to daily self care skills.  Patient able to verbalize and demonstrate lateral leans to left and right for donning/ doffing pants.  Patient able to transfer wheelchair to mat table toward left with supervision assistance.  Patient able to transfer back to wheelchair toward right with supervision.  Patient needing cueing for set up of wheelchair for success.  Patient practiced UE strengthening exercises, wheelchair push ups, scoots and slides with push up blocks.  Patient clearly stating he needed physical space to safely complete various transitions.  "I can't do this stuff if the therapists are right on top of me."  Patient did utilize left prosthetic leg and upper body strength to lift self up in transfer back to wheelchair to clear wheelchair brake.    Therapy Documentation Precautions:  Precautions Precautions: Fall;Other (comment) Precaution Comments: LE precautions Restrictions Weight Bearing Restrictions: Yes RLE Weight Bearing:  (amputated leg) Pain:  Pain in right residual limb.  Agreeable to pain medicine with prompting.  States, "I don't like to have to take pain medicine." See FIM for current functional status  Therapy/Group: Individual Therapy  Collier Salina 08/16/2012, 2:30 PM

## 2012-08-16 NOTE — Progress Notes (Signed)
Social Work Aaron Chris, LCSW Social Worker Signed  Patient Care Conference Service date: 08/16/2012 1:05 PM  Inpatient RehabilitationTeam Conference and Plan of Care Update Date: 08/16/2012   Time: 10:40AM     Patient Name: Aaron Long       Medical Record Number: [id number]   Date of Birth: 05-16-1940 Sex: Male         Room/Bed: 4W18C/4W18C-01 Payor Info: Payor: MEDICARE / Plan: MEDICARE PART A AND B / Product Type: *No Product type* /   Admitting Diagnosis: RT BKA  OLD LT BKA   Admit Date/Time:  08/11/2012  3:46 PM Admission Comments: No comment available   Primary Diagnosis:  Hx of BKA Principal Problem: Hx of BKA    Patient Active Problem List     Diagnosis  Date Noted   .  Hx of BKA  08/14/2012   .  Aftercare following surgery of the circulatory system, NEC  08/03/2012   .  Type I (juvenile type) diabetes mellitus with peripheral circulatory disorders, not stated as uncontrolled(250.71)  07/24/2012   .  Anemia in chronic kidney disease(285.21)  07/24/2012   .  Diabetes mellitus type 2, controlled, with complications  06/28/2012   .  Fever and chills  06/19/2012   .  Cellulitis of foot, right  06/19/2012   .  Anemia  06/19/2012   .  Atherosclerosis of native arteries of the extremities with ulceration(440.23)  06/01/2012   .  Diabetic osteomyelitis  05/23/2012   .  Hyperkalemia  04/24/2012   .  Peripheral vascular disease, unspecified  01/27/2012   .  Diabetic neuropathy, type II diabetes mellitus  07/27/2011   .  Atherosclerosis of native arteries of the extremities with intermittent claudication  07/08/2011   .  S/P BKA (below knee amputation)  06/22/2011   .  Atherosclerosis of native arteries of the extremities with gangrene  06/10/2011   .  Pre-operative cardiovascular examination  06/01/2011   .  Diabetic toe ulcer  05/20/2011   .  Acute kidney injury  05/02/2011   .  CKD (chronic kidney disease) stage 4, GFR 15-29 ml/min  05/02/2011   .  Cellulitis   05/02/2011   .  Gangrene due to secondary diabetes mellitus  04/29/2011   .  Diabetic foot ulcer associated with type 2 diabetes mellitus  04/28/2011   .  HTN (hypertension)  04/28/2011   .  CAD (coronary artery disease)  04/28/2011   .  PAD (peripheral artery disease)  04/28/2011   .  Prostate cancer  04/28/2011     Expected Discharge Date: Expected Discharge Date: 08/21/12  Team Members Present: Physician leading conference: Dr. Faith Rogue Social Worker Present: Staci Acosta, LCSW;Becky Terin Dierolf, LCSW Nurse Present: Ethelene Browns, RN PT Present: Edman Circle, PT;Bridgett Ripa, Clearnce Hasten, PT OT Present: Rosalio Loud, OT;Kris Gellert, Carollee Sires, OT SLP Present: Fae Pippin, Charlie Pitter, SLP        Current Status/Progress  Goal  Weekly Team Focus   Medical     right bka, having pain in new and old residual limbs  improve pain and cognition to allow oparticipation in therapies  safety, wound care, pain control   Bowel/Bladder     Continent of bowel and bladder. LBM 8/41/4  Pt to remain continent of bowel and bladder  Monitor   Swallow/Nutrition/ Hydration            ADL's     min-mod assist overall  supervision overall  ADL  retraining, functional mobiltiy retraining, pt education, UE strengthening   Mobility     Min A sliding board transfers, 40' WC propulsion with S, 3' gait in // bars  S transfers, community WC mob, 25' gait with S and RW   LE strengthening and ROM/positioning, transfer training, WC propulsion and UE strength, car transfers   Communication            Safety/Cognition/ Behavioral Observations           Pain     Tylenol 650mg  prn. Vicodin x 1-2 prn q 4hrs  <3  Pain managed with Tylenol 650mg  at this time   Skin     R BKA with staples approximated, unremarkable, Shearing to buttock  No additional skin breakdown  Monitor for appropriate healing     *See Care Plan and progress notes for long and short-term goals.     Barriers to Discharge:  resistance to stand on left leg alone      Possible Resolutions to Barriers:    education, scoot transfers, pain control      Discharge Planning/Teaching Needs:    Home with wife who can assist, been through this before      Team Discussion:    Pain in standing with prosthetic leg but is willing to try.  Off pain meds back to baseline-not confused now.  Scared to stand on leg. Wife to be in for family education   Revisions to Treatment Plan:    None    Continued Need for Acute Rehabilitation Level of Care: The patient requires daily medical management by a physician with specialized training in physical medicine and rehabilitation for the following conditions: Daily direction of a multidisciplinary physical rehabilitation program to ensure safe treatment while eliciting the highest outcome that is of practical value to the patient.: Yes Daily medical management of patient stability for increased activity during participation in an intensive rehabilitation regime.: Yes Daily analysis of laboratory values and/or radiology reports with any subsequent need for medication adjustment of medical intervention for : Post surgical problems;Cardiac problems (renal)  Aaron Long, Aaron Long 08/16/2012, 1:05 PM          Patient ID: Aaron Long, male   DOB: [birth year], 72 y.o.   MRN: [id number]

## 2012-08-16 NOTE — Progress Notes (Signed)
Occupational Therapy Session Notes  Patient Details  Name: Aaron Long MRN: [id number] Date of Birth: [birth year]  Today's Date: 08/16/2012 Time: 0930-1030 and 145-225 Time Calculation (min): 60 min and 40 min  Short Term Goals: Week 1:  OT Short Term Goal 1 (Week 1): Pt. will do lateral leans for pericare with SBA OT Short Term Goal 2 (Week 1): Pt. will transfer to toilet with min assist OT Short Term Goal 3 (Week 1): Pt. will stand  at sink for 30 seconds with min assist for balance.    Skilled Therapeutic Interventions/Progress Updates:  1)  Upon arrival for scheduled OT bath and dress session, patient in bed and donning his LLE garments prior to donning his prosthetic.  Patient and wife report that patient performed his bath and dress in the bed and all that his wife did was set him up with supplies and clothes.  Patient and wife report that since w/c does not fit in the bathroom at home and he does not feel like he is ready to practice standing and walking, he plans to take a sponge bath and dress at a bed level at discharge.  Tub bench LTG was discharged secondary to n/a at this time.  Patient practiced scoot/squat pivot traveling to his left for bed>w/c transfer with steady assist and vcs.  Patient propelled w/c to gym and practiced transfers to mat that had been elevated to height similar to his bed at home.  Patient chose to practice traveling to his right for scoot/squat pivot transfer and required mod A to lift bottom high enough to get his right buttock onto the mat.  Steady assist for w/c to travel to his left going downhill. Patient attempted transfer back uphill travelling opposite direction-to his left and was unable to lift his bottom high enough to get his left buttock onto the mat.  After discussion about his options, patient reported that he wants to practice getting into the bed traveling to his right because "I did not need any help".  Reviewed how much assistance I provided  for safety and patient continued to deny that I assisted or that he needed assistance!  Will attempt again in the PM.  2)  Practiced w/c><mat 2 Xs and w/c>hospital bed transfers.  Patient required min assist to steady w/c secondary to patient requesting to perform these transfers his way and without assistance.  Patient heavily relies pushing on w/c and transfers uphill (bed at home is high) are currently unsafe in terms of technique.  Patient pushes w/c away so it moves sideways then he places his hand on the back of the w/c and it rocks backwards.  Patient declines to use slide board.  Reviewed need for patient to lift his buttocks during transfer and not to allow his buttocks to drag secondary to he reports that he has a sore on his bottom.  All items within reach and wife present upon departure.  Therapy Documentation Precautions:  Precautions Precautions: Fall;Other (comment) Precaution Comments: LE precautions Restrictions Weight Bearing Restrictions: Yes RLE Weight Bearing:  (amputated leg) Pain: 1)  Denies pain yet reports "soreness" in right residual limb, 2)  Denies pain, premedicated ADL: See FIM for current functional status  Therapy/Group: Individual Therapy both sessions  Catia Todorov 08/16/2012, 12:14 PM

## 2012-08-16 NOTE — Progress Notes (Signed)
Subjective/Complaints: Rested fairly well. Pain for the most part under control. A 12 point review of systems has been performed and if not noted above is otherwise negative.   Objective: Vital Signs: Blood pressure 169/79, pulse 84, temperature 98.2 F (36.8 C), temperature source Oral, resp. rate 18, height 5\' 8"  (1.727 m), weight 84.2 kg (185 lb 10 oz), SpO2 97.00%. No results found.  Recent Labs  08/14/12 0605 08/16/12 0630  WBC 6.6 7.6  HGB 7.6* 8.3*  HCT 23.0* 25.8*  PLT 229 347    Recent Labs  08/14/12 0605  NA 137  K 4.7  CL 101  GLUCOSE 137*  BUN 35*  CREATININE 2.49*  CALCIUM 9.2   CBG (last 3)   Recent Labs  08/15/12 1635 08/15/12 2102 08/16/12 0730  GLUCAP 148* 188* 129*    Wt Readings from Last 3 Encounters:  08/11/12 84.2 kg (185 lb 10 oz)  08/07/12 96.163 kg (212 lb)  08/07/12 96.163 kg (212 lb)    Physical Exam:  HENT: oral mucosa pink and moist  Head: Normocephalic. Dentition fair  Eyes:  Pupils reactive to light  Neck: Normal range of motion. Neck supple. No thyromegaly present.  Cardiovascular: Normal rate and regular rhythm. No murmur or rub  Pulmonary/Chest: Effort normal and breath sounds normal. No respiratory distress. No wheezes rales or rhonchi  Abdominal: Soft. Bowel sounds are normal. He exhibits no distension.  Neurological: He is alert.  Patient alert and Oriented x 4. . UE's 5/5. RLE is 2/5(pain). LLE is 4-5/5. No gross sensory changes. Good insight and awareness.  Skin:  Left BKA is healed. Right BKA is intact with significant swelling. remains tender , not covered Neuro:  Eyes without evidence of nystagmus    Assessment/Plan: 1. Functional deficits secondary to right BKA (prior left BKA) which require 3+ hours per day of interdisciplinary therapy in a comprehensive inpatient rehab setting. Physiatrist is providing close team supervision and 24 hour management of active medical problems listed below. Physiatrist and  rehab team continue to assess barriers to discharge/monitor patient progress toward functional and medical goals. FIM: FIM - Bathing Bathing Steps Patient Completed: Chest;Right Arm;Left Arm;Abdomen;Front perineal area;Buttocks;Right upper leg;Left upper leg Bathing: 4: Steadying assist (lateral leans)  FIM - Upper Body Dressing/Undressing Upper body dressing/undressing steps patient completed: Thread/unthread right sleeve of pullover shirt/dresss;Thread/unthread left sleeve of pullover shirt/dress;Put head through opening of pull over shirt/dress;Pull shirt over trunk Upper body dressing/undressing: 5: Set-up assist to: Obtain clothing/put away FIM - Lower Body Dressing/Undressing Lower body dressing/undressing steps patient completed: Thread/unthread right pants leg;Thread/unthread left pants leg;Pull pants up/down Lower body dressing/undressing: 4: Min-Patient completed 75 plus % of tasks  FIM - Toileting Toileting steps completed by patient: Adjust clothing prior to toileting Toileting Assistive Devices: Grab bar or rail for support Toileting:  (assist to perform pants back up)  FIM - Diplomatic Services operational officer Devices: Elevated toilet seat;Grab bars;Prosthesis Toilet Transfers: 3-To toilet/BSC: Mod A (lift or lower assist);3-From toilet/BSC: Mod A (lift or lower assist)  FIM - Banker Devices: Sliding board;Arm rests;Prosthesis Bed/Chair Transfer: 3: Bed > Chair or W/C: Mod A (lift or lower assist);3: Chair or W/C > Bed: Mod A (lift or lower assist)  FIM - Locomotion: Wheelchair Distance: 160 Locomotion: Wheelchair: 5: Travels 150 ft or more: maneuvers on rugs and over door sills with supervision, cueing or coaxing FIM - Locomotion: Ambulation Locomotion: Ambulation Assistive Devices: Parallel bars Ambulation/Gait Assistance: Not tested (comment) Locomotion: Ambulation: 0:  Activity did not  occur  Comprehension Comprehension Mode: Auditory Comprehension: 5-Understands basic 90% of the time/requires cueing < 10% of the time  Expression Expression Mode: Verbal Expression: 5-Expresses basic 90% of the time/requires cueing < 10% of the time.  Social Interaction Social Interaction: 6-Interacts appropriately with others with medication or extra time (anti-anxiety, antidepressant).  Problem Solving Problem Solving: 4-Solves basic 75 - 89% of the time/requires cueing 10 - 24% of the time  Memory Memory: 4-Recognizes or recalls 75 - 89% of the time/requires cueing 10 - 24% of the time Medical Problem List and Plan:  1. Right BKA 08/08/2012/history of left BKA  -has prosthesis with him  -ACE wrap, dressing for right residual limb, wound looking good, still swollen 2. DVT Prophylaxis/Anticoagulation: Subcutaneous Lovenox. Monitor platelet counts  3. Pain Management: Hydrocodone prn. Monitor mental status (see below)  4. Mood/postoperative confusion. Limited narcotics and monitor with increased mobility-  -pt feeling that he's close to baseline.   5. Neuropsych: This patient is capable of making decisions on his own behalf.  6. Acute blood loss anemia. Followup CBC and monitor for any bleeding episodes. Wound remains without drainage at present  -hgb back to 8.3 today 7. Chronic renal insufficiency. Baseline creatinine 2.6-2.9. Followup chemistries consistent 8. Diabetes mellitus with peripheral neuropathy. Hemoglobin A1c 6.8. Levemir 18 units daily. Check blood sugars a.c. and at bedtime. Adjust regimen as needed moving forward.  9. Hypertension. Norvasc 5 mg daily, Cardura 0.5 mg each bedtime. Monitor with increased mobility  10. Hyperlipidemia. Lipitor  12. Coronary artery disease with history of myocardial infarction. Continue Plavix/aspirin. No complaints of chest pain   LOS (Days) 5 A FACE TO FACE EVALUATION WAS PERFORMED  Khalie Wince T 08/16/2012 9:41 AM

## 2012-08-16 NOTE — Progress Notes (Signed)
Social Work Patient ID: Aaron Long, male   DOB: Nov 11, 1940, 72 y.o.   MRN: [id number] Met with pt and wife to discuss team conference goals -supervision wheelchair level and discharge 8/11.  Pt reluctant to put weight on his prosthetic leg, feels unstable And will need to get used to it.  He is willing to try and will work with therapies on this.  Discussed wife is not always there with him and will need to transfer himself. Their home is wheelchair accessible.  Wife reports she is having dental surgery on 8/11 and eye surgery in Sept.  Pt reports: ' I will manage I did before."  Pt reports being very protective of his leg due to being very sore.  Will work on discharge plans and see how pt progresses.

## 2012-08-16 NOTE — Progress Notes (Signed)
Physical Therapy Note  Patient Details  Name: Aaron Long MRN: [id number] Date of Birth: 18-Aug-1940 Today's Date: 08/16/2012  1100-1155 (55 minutes) group Pain: no reported pain  Pt participated in PT group session focused on UE strengthening/endurance performing UE ball toss for warmup, UE ergonometer x 10 minutes Level 2 random program, bilateral UE strengthening using green theraband.  [name],JIM 08/16/2012, 12:08 PM

## 2012-08-17 ENCOUNTER — Inpatient Hospital Stay (HOSPITAL_COMMUNITY): Payer: Medicare Other | Admitting: *Deleted

## 2012-08-17 ENCOUNTER — Inpatient Hospital Stay (HOSPITAL_COMMUNITY): Payer: Medicare Other

## 2012-08-17 ENCOUNTER — Inpatient Hospital Stay (HOSPITAL_COMMUNITY): Payer: Medicare Other | Admitting: Occupational Therapy

## 2012-08-17 DIAGNOSIS — I739 Peripheral vascular disease, unspecified: Secondary | ICD-10-CM

## 2012-08-17 DIAGNOSIS — L98499 Non-pressure chronic ulcer of skin of other sites with unspecified severity: Secondary | ICD-10-CM

## 2012-08-17 DIAGNOSIS — N186 End stage renal disease: Secondary | ICD-10-CM

## 2012-08-17 DIAGNOSIS — S88119A Complete traumatic amputation at level between knee and ankle, unspecified lower leg, initial encounter: Secondary | ICD-10-CM

## 2012-08-17 DIAGNOSIS — E1165 Type 2 diabetes mellitus with hyperglycemia: Secondary | ICD-10-CM

## 2012-08-17 LAB — GLUCOSE, CAPILLARY
Glucose-Capillary: 118 mg/dL — ABNORMAL HIGH (ref 70–99)
Glucose-Capillary: 114 mg/dL — ABNORMAL HIGH (ref 70–99)

## 2012-08-17 NOTE — Plan of Care (Signed)
Problem: RH BLADDER ELIMINATION Goal: RH STG MANAGE BLADDER WITH ASSISTANCE STG Manage Bladder With min Assistance  Outcome: Progressing Incontinent during the night, per report

## 2012-08-17 NOTE — Progress Notes (Signed)
Occupational Therapy Session Note  Patient Details  Name: Aaron Long MRN: [id number] Date of Birth: 06-Dec-1940  Today's Date: 08/17/2012 Time: 1425-1510 Time Calculation (min): 45 min  Short Term Goals: Week 1:  OT Short Term Goal 1 (Week 1): Pt. will do lateral leans for pericare with SBA OT Short Term Goal 2 (Week 1): Pt. will transfer to toilet with min assist OT Short Term Goal 3 (Week 1): Pt. will stand  at sink for 30 seconds with min assist for balance.    Skilled Therapeutic Interventions/Progress Updates:  Patient resting in w/c upon arrival.  Engaged in drop arm commode transfer training with focus on DME set up and optimal placement to include consideration of his home environment, scoot, squat/pivot transfer techniques to include optimal hand placement and forward hip/trunk flexion, left prosthetic on the floor for support and safety, and to lift his buttocks instead of drag it to reduce sheer.  Patient was able to perform w/c>drop arm commode>bed (slightly elevated like he has at home) transfers all with supervision/cues.  Patient reports that he does not like to use the slide board.  Patient able to demonstrate lateral leans for pants down and up and simulate performing hygiene all with supervision!  Patient resting in bed with all items in reach.  Therapy Documentation Precautions:  Precautions Precautions: Fall;Other (comment) Precaution Comments: LE precautions Restrictions Weight Bearing Restrictions: Yes RLE Weight Bearing:  (amputated leg) Pain: Pain Assessment Pain Assessment: No/denies pain Pain Score: 0-No pain See FIM for current functional status  Therapy/Group: Individual Therapy  Kerrie Latour 08/17/2012, 4:48 PM

## 2012-08-17 NOTE — Plan of Care (Signed)
Problem: RH SAFETY Goal: RH STG ADHERE TO SAFETY PRECAUTIONS W/ASSISTANCE/DEVICE STG Adhere to Safety Precautions With Assistance/Device. Supervision Outcome: Progressing Calls appropriately for assistance     

## 2012-08-17 NOTE — Progress Notes (Signed)
Orthopedic Tech Progress Note Patient Details:  Aaron Long 1940/11/07 [id number] Brace order called in Patient ID: Aaron Long, male   DOB: 01-27-1940, 72 y.o.   MRN: [id number]   Aaron Long 08/17/2012, 9:54 AM

## 2012-08-17 NOTE — Plan of Care (Signed)
Problem: RH SKIN INTEGRITY Goal: RH STG SKIN FREE OF INFECTION/BREAKDOWN Outcome: Progressing No evident of infection

## 2012-08-17 NOTE — Plan of Care (Signed)
Problem: RH BOWEL ELIMINATION Goal: RH STG MANAGE BOWEL WITH ASSISTANCE STG Manage Bowel with min Assistance.  Outcome: Not Progressing Incontinent per report

## 2012-08-17 NOTE — Progress Notes (Addendum)
Physical Therapy Session Note  Patient Details  Name: Aaron Long MRN: [id number] Date of Birth: May 25, 1940  Today's Date: 08/17/2012 Time: 0815-0900 Time Calculation (min): 45 min  Short Term Goals: Week 1:  PT Short Term Goal 1 (Week 1): Pt will required S for rolling and supine to sit, sit to supine without use of siderail and head of bed flat.  PT Short Term Goal 2 (Week 1): Pt will demonstrate transfs w/c to bed, bed to w/c with LRAD and supervision.  PT Short Term Goal 3 (Week 1): Pt will propel w/c greater than 150 feet indepdently.  PT Short Term Goal 4 (Week 1): Pt will ambulate with rolling walker and mod Ax1, with L prosthesis ~ 15 feet.  PT Short Term Goal 5 (Week 1): Pt will increase R LE strength to at least 3+/5 throughout.   Pt initially had LTG for ambulation in controlled env, but pt declines this goal, so it has been discontinued.  Skilled Therapeutic Interventions/Progress Updates:   Pt performed press ups x 2 in w/c while NT and therapist donned pt's brief and shorts.  Breakfast late; pt flustered and asked for time to eat.  Pain in L residual limb; RN informed. She gave pain meds, assessed and wrapped R residual limb.  W/c propulsion x 150' x 2 distant supervision, VCs for efficiency.  W/c>< mat scoot pivot on L prosthesis, close supervision, VCs for techniques.  Assist for set-up of w/c.  Dynamic sitting balance, R residual limb supported in front of pt: reaching in all directions outside of BOs with either hand, close supervision.    Therapy Documentation Precautions:  Precautions Precautions: Fall;Other (comment) Precaution Comments: LE precautions Restrictions Weight Bearing Restrictions: Yes RLE Weight Bearing:  (amputated leg) General: Amount of Missed PT Time (min): 15 Minutes Missed Time Reason: Nursing care;Other (comment) (declined due to late breakfast) O2 Device: None (Room air) Pain: Pain Assessment Pain Score: 6  Pain Location:  Leg Pain Orientation: Right Pain Intervention(s): RN made aware     See FIM for current functional status  Therapy/Group: Individual Therapy  Aaron Long 08/17/2012, 9:04 AM

## 2012-08-17 NOTE — Progress Notes (Signed)
Patient ID: Aaron Long, male   DOB: 04-Nov-1940, 72 y.o.   MRN: [id number] Subjective/Complaints: Rested fairly well. Pain for the most part under control. A 12 point review of systems has been performed and if not noted above is otherwise negative.   Objective: Vital Signs: Blood pressure 155/67, pulse 80, temperature 98.3 F (36.8 C), temperature source Oral, resp. rate 20, height 5\' 8"  (1.727 m), weight 80.287 kg (177 lb), SpO2 100.00%. No results found.  Recent Labs  08/16/12 0630  WBC 7.6  HGB 8.3*  HCT 25.8*  PLT 347   No results found for this basename: NA, K, CL, CO, GLUCOSE, BUN, CREATININE, CALCIUM,  in the last 72 hours CBG (last 3)   Recent Labs  08/16/12 1205 08/16/12 1622 08/16/12 2055  GLUCAP 146* 118* 102*    Wt Readings from Last 3 Encounters:  08/16/12 80.287 kg (177 lb)  08/07/12 96.163 kg (212 lb)  08/07/12 96.163 kg (212 lb)    Physical Exam:  HENT: oral mucosa pink and moist  Head: Normocephalic. Dentition fair  Eyes:  Pupils reactive to light  Neck: Normal range of motion. Neck supple. No thyromegaly present.  Cardiovascular: Normal rate and regular rhythm. No murmur or rub  Pulmonary/Chest: Effort normal and breath sounds normal. No respiratory distress. No wheezes rales or rhonchi  Abdominal: Soft. Bowel sounds are normal. He exhibits no distension.  Neurological: He is alert.  Patient alert and Oriented x 4. . UE's 5/5. RLE is 2/5(pain). LLE is 4-5/5. No gross sensory changes. Good insight and awareness.  Skin:  Left BKA is healed. Right BKA is intact with significant swelling. remains tender , Stump edema, incision CDI Neuro:  Eyes without evidence of nystagmus    Assessment/Plan: 1. Functional deficits secondary to right BKA (prior left BKA) which require 3+ hours per day of interdisciplinary therapy in a comprehensive inpatient rehab setting. Physiatrist is providing close team supervision and 24 hour management of active  medical problems listed below. Physiatrist and rehab team continue to assess barriers to discharge/monitor patient progress toward functional and medical goals. FIM: FIM - Bathing Bathing Steps Patient Completed: Chest;Right Arm;Left Arm;Abdomen;Front perineal area;Buttocks;Right upper leg;Left upper leg Bathing: 4: Steadying assist (lateral leans)  FIM - Upper Body Dressing/Undressing Upper body dressing/undressing steps patient completed: Thread/unthread right sleeve of pullover shirt/dresss;Thread/unthread left sleeve of pullover shirt/dress;Put head through opening of pull over shirt/dress;Pull shirt over trunk Upper body dressing/undressing: 5: Set-up assist to: Obtain clothing/put away FIM - Lower Body Dressing/Undressing Lower body dressing/undressing steps patient completed: Thread/unthread right pants leg;Thread/unthread left pants leg;Pull pants up/down Lower body dressing/undressing: 4: Min-Patient completed 75 plus % of tasks  FIM - Toileting Toileting steps completed by patient: Adjust clothing prior to toileting Toileting Assistive Devices: Grab bar or rail for support Toileting:  (assist to perform pants back up)  FIM - Diplomatic Services operational officer Devices: Elevated toilet seat;Grab bars;Prosthesis Toilet Transfers: 3-To toilet/BSC: Mod A (lift or lower assist);3-From toilet/BSC: Mod A (lift or lower assist)  FIM - Banker Devices: Sliding board;Arm rests;Prosthesis Bed/Chair Transfer: 5: Bed > Chair or W/C: Supervision (verbal cues/safety issues);5: Chair or W/C > Bed: Supervision (verbal cues/safety issues) (level surface, increased time)  FIM - Locomotion: Wheelchair Distance: 160 Locomotion: Wheelchair: 5: Travels 150 ft or more: maneuvers on rugs and over door sills with supervision, cueing or coaxing FIM - Locomotion: Ambulation Locomotion: Ambulation Assistive Devices: Parallel bars Ambulation/Gait  Assistance: Not tested (comment) Locomotion: Ambulation: 0:  Activity did not occur  Comprehension Comprehension Mode: Auditory Comprehension: 5-Follows basic conversation/direction: With extra time/assistive device  Expression Expression Mode: Verbal Expression: 5-Expresses basic 90% of the time/requires cueing < 10% of the time.  Social Interaction Social Interaction: 6-Interacts appropriately with others with medication or extra time (anti-anxiety, antidepressant).  Problem Solving Problem Solving: 4-Solves basic 75 - 89% of the time/requires cueing 10 - 24% of the time  Memory Memory: 4-Recognizes or recalls 75 - 89% of the time/requires cueing 10 - 24% of the time Medical Problem List and Plan:  1. Right BKA 08/08/2012/history of left BKA  -has prosthesis with him  -stump Shrinker right residual limb, wound looking good, still swollen 2. DVT Prophylaxis/Anticoagulation: Subcutaneous Lovenox. Monitor platelet counts  3. Pain Management: Hydrocodone prn. Monitor mental status (see below)  4. Mood/postoperative confusion. Limited narcotics and monitor with increased mobility-  -pt feeling that he's close to baseline.   5. Neuropsych: This patient is capable of making decisions on his own behalf.  6. Acute blood loss anemia. Followup CBC and monitor for any bleeding episodes. Wound remains without drainage at present   7. Chronic renal insufficiency. Baseline creatinine 2.6-2.9. Followup chemistries consistent 8. Diabetes mellitus with peripheral neuropathy. Hemoglobin A1c 6.8. Levemir 18 units daily. Check blood sugars a.c. and at bedtime. Adjust regimen as needed moving forward.  9. Hypertension. Norvasc 5 mg daily, Cardura 0.5 mg each bedtime. Monitor with increased mobility  10. Hyperlipidemia. Lipitor  12. Coronary artery disease with history of myocardial infarction. Continue Plavix/aspirin. No complaints of chest pain   LOS (Days) 6 A FACE TO FACE EVALUATION WAS  PERFORMED  Lucciano Vitali E 08/17/2012 7:08 AM

## 2012-08-17 NOTE — Plan of Care (Signed)
Problem: RH PAIN MANAGEMENT Goal: RH STG PAIN MANAGED AT OR BELOW PT'S PAIN GOAL Less than 7 Outcome: Progressing Pt needs reminder to ask for pain medication. Previously refusing.

## 2012-08-17 NOTE — Progress Notes (Signed)
Physical Therapy Session Note  Patient Details  Name: Aaron Long MRN: [id number] Date of Birth: 1940/05/26  Today's Date: 08/17/2012 Time: 4098-1191 Time Calculation (min): 26 min  Short Term Goals: Week 1:  PT Short Term Goal 1 (Week 1): Pt will required S for rolling and supine to sit, sit to supine without use of siderail and head of bed flat.  PT Short Term Goal 2 (Week 1): Pt will demonstrate transfs w/c to bed, bed to w/c with LRAD and supervision.  PT Short Term Goal 3 (Week 1): Pt will propel w/c greater than 150 feet indepdently.  PT Short Term Goal 4 (Week 1): Pt will ambulate with rolling walker and mod Ax1, with L prosthesis ~ 15 feet.  PT Short Term Goal 5 (Week 1): Pt will increase R LE strength to at least 3+/5 throughout.   Skilled Therapeutic Interventions/Progress Updates:    Patient received sitting in wheelchair. Session focused on wheelchair mobility and ramp negotiation, see details below. Patient returned to room and left seated in wheelchair with all needs within reach.  Therapy Documentation Precautions:  Precautions Precautions: Fall;Other (comment) Precaution Comments: LE precautions Restrictions Weight Bearing Restrictions: Yes RLE Weight Bearing:  (amputated leg) Pain: Pain Assessment Pain Assessment: No/denies pain Pain Score: 0-No pain Pain Type: Surgical pain Pain Location: Leg Pain Orientation: Right Pain Descriptors / Indicators: Aching Pain Onset: On-going Pain Intervention(s): Other (Comment) (premedicated) Locomotion : Ambulation Ambulation/Gait Assistance: Not tested (comment) Stairs / Additional Locomotion Ramp: 4: Min assist (seated in wc; ascends forwards x1, backwards x2) Naval architect Mobility: Yes Wheelchair Assistance: 5: Supervision Wheelchair Assistance Details: Verbal cues for precautions/safety;Visual cues/gestures for precautions/safety Wheelchair Propulsion: Both upper extremities Wheelchair Parts  Management: Needs assistance Distance: 160   See FIM for current functional status  Therapy/Group: Individual Therapy  Chipper Herb. Glorimar Stroope, PT, DPT 08/17/2012, 2:01 PM

## 2012-08-17 NOTE — Progress Notes (Signed)
Orthopedic Tech Progress Note Patient Details:  Aaron Long 16-Oct-1940 [id number] Brace order completed by Peyton Najjar from [employer]  Patient ID: Aaron Long, male   DOB: 05-Jun-1940, 72 y.o.   MRN: [id number]   Aaron Long 08/17/2012, 11:09 AM

## 2012-08-17 NOTE — Progress Notes (Signed)
Occupational Therapy Session Note  Patient Details  Name: Aaron Long MRN: [id number] Date of Birth: [birth year]  Today's Date: 08/17/2012 Time: 1010-1110 Time Calculation (min): 60 min  Short Term Goals: Week 1:  OT Short Term Goal 1 (Week 1): Pt. will do lateral leans for pericare with SBA OT Short Term Goal 2 (Week 1): Pt. will transfer to toilet with min assist OT Short Term Goal 3 (Week 1): Pt. will stand  at sink for 30 seconds with min assist for balance.    Skilled Therapeutic Interventions/Progress Updates:      Pt seen for BADL retraining of  bathing and dressing with a focus on lateral leans from bed level as this is where he will be performing these tasks at home.  He needed occasional cues for technique but was able to complete tasks with supervision. Pt donned L prosthesis and transferred to w/c with supervision to complete grooming at the sink.   Therapy Documentation Precautions:  Precautions Precautions: Fall;Other (comment) Precaution Comments: LE precautions Restrictions Weight Bearing Restrictions: Yes RLE Weight Bearing:  (amputated leg)  Pain: Pain Assessment Pain Assessment: 0-10 Pain Score: 5  Pain Type: Surgical pain Pain Location: Leg Pain Orientation: Right Pain Descriptors / Indicators: Aching Pain Onset: On-going Pain Intervention(s): Other (Comment) (premedicated) ADL:  See FIM for current functional status  Therapy/Group: Individual Therapy  Dorean Hiebert 08/17/2012, 11:28 AM

## 2012-08-18 ENCOUNTER — Inpatient Hospital Stay (HOSPITAL_COMMUNITY): Payer: Medicare Other | Admitting: *Deleted

## 2012-08-18 ENCOUNTER — Inpatient Hospital Stay (HOSPITAL_COMMUNITY): Payer: Medicare Other | Admitting: Occupational Therapy

## 2012-08-18 DIAGNOSIS — N186 End stage renal disease: Secondary | ICD-10-CM

## 2012-08-18 DIAGNOSIS — I739 Peripheral vascular disease, unspecified: Secondary | ICD-10-CM

## 2012-08-18 DIAGNOSIS — L98499 Non-pressure chronic ulcer of skin of other sites with unspecified severity: Secondary | ICD-10-CM

## 2012-08-18 DIAGNOSIS — E1165 Type 2 diabetes mellitus with hyperglycemia: Secondary | ICD-10-CM

## 2012-08-18 DIAGNOSIS — S88119A Complete traumatic amputation at level between knee and ankle, unspecified lower leg, initial encounter: Secondary | ICD-10-CM

## 2012-08-18 LAB — GLUCOSE, CAPILLARY
Glucose-Capillary: 142 mg/dL — ABNORMAL HIGH (ref 70–99)
Glucose-Capillary: 120 mg/dL — ABNORMAL HIGH (ref 70–99)
Glucose-Capillary: 141 mg/dL — ABNORMAL HIGH (ref 70–99)

## 2012-08-18 LAB — CREATININE, SERUM
Creatinine, Ser: 2.6 mg/dL — ABNORMAL HIGH (ref 0.50–1.35)
GFR calc Af Amer: 27 mL/min — ABNORMAL LOW (ref >90)
GFR calc non Af Amer: 23 mL/min — ABNORMAL LOW (ref >90)

## 2012-08-18 MED ORDER — DOXAZOSIN MESYLATE 1 MG PO TABS
1.0000 mg | ORAL_TABLET | Freq: Every day | ORAL | Status: DC
  Administered 2012-08-18 – 2012-08-20 (×3): 1 mg via ORAL
  Filled 2012-08-18 (×4): qty 1

## 2012-08-18 MED ORDER — PREGABALIN 50 MG PO CAPS
75.0000 mg | ORAL_CAPSULE | Freq: Two times a day (BID) | ORAL | Status: DC
  Administered 2012-08-18 – 2012-08-21 (×6): 75 mg via ORAL
  Filled 2012-08-18 (×6): qty 1

## 2012-08-18 NOTE — Progress Notes (Signed)
Patient ID: Aaron Long, male   DOB: 1940-04-25, 72 y.o.   MRN: [id number] Subjective/Complaints: Rested fairly well. Pain for the most part under control. A 12 point review of systems has been performed and if not noted above is otherwise negative.   Objective: Vital Signs: Blood pressure 140/90, pulse 84, temperature 98 F (36.7 C), temperature source Oral, resp. rate 19, height 5\' 8"  (1.727 m), weight 80.287 kg (177 lb), SpO2 97.00%. No results found.  Recent Labs  08/16/12 0630  WBC 7.6  HGB 8.3*  HCT 25.8*  PLT 347    Recent Labs  08/18/12 0600  CREATININE 2.60*   CBG (last 3)   Recent Labs  08/18/12 0153 08/18/12 0741 08/18/12 0944  GLUCAP 85 175* 235*    Wt Readings from Last 3 Encounters:  08/16/12 80.287 kg (177 lb)  08/07/12 96.163 kg (212 lb)  08/07/12 96.163 kg (212 lb)    Physical Exam:  HENT: oral mucosa pink and moist  Head: Normocephalic. Dentition fair  Eyes:  Pupils reactive to light  Neck: Normal range of motion. Neck supple. No thyromegaly present.  Cardiovascular: Normal rate and regular rhythm. No murmur or rub  Pulmonary/Chest: Effort normal and breath sounds normal. No respiratory distress. No wheezes rales or rhonchi  Abdominal: Soft. Bowel sounds are normal. He exhibits no distension.  Neurological: He is alert.  Patient alert and Oriented x 4. . UE's 5/5. RLE is 2/5(pain). LLE is 4-5/5. No gross sensory changes. Good insight and awareness.  Skin:  Left BKA is healed. Right BKA is intact with significant swelling. remains tender , Stump edema, incision CDI Neuro:  Eyes without evidence of nystagmus    Assessment/Plan: 1. Functional deficits secondary to right BKA (prior left BKA) which require 3+ hours per day of interdisciplinary therapy in a comprehensive inpatient rehab setting. Physiatrist is providing close team supervision and 24 hour management of active medical problems listed below. Physiatrist and rehab team  continue to assess barriers to discharge/monitor patient progress toward functional and medical goals. FIM: FIM - Bathing Bathing Steps Patient Completed: Chest;Right Arm;Left Arm;Abdomen;Front perineal area;Buttocks;Right upper leg;Left upper leg Bathing: 5: Supervision: Safety issues/verbal cues  FIM - Upper Body Dressing/Undressing Upper body dressing/undressing steps patient completed: Thread/unthread right sleeve of pullover shirt/dresss;Thread/unthread left sleeve of pullover shirt/dress;Put head through opening of pull over shirt/dress;Pull shirt over trunk Upper body dressing/undressing: 5: Set-up assist to: Obtain clothing/put away FIM - Lower Body Dressing/Undressing Lower body dressing/undressing steps patient completed: Thread/unthread right pants leg;Thread/unthread left pants leg;Pull pants up/down Lower body dressing/undressing: 5: Supervision: Safety issues/verbal cues (from bed level)  FIM - Toileting Toileting steps completed by patient: Adjust clothing prior to toileting;Performs perineal hygiene;Adjust clothing after toileting Toileting Assistive Devices: Grab bar or rail for support Toileting: 5: Supervision: Safety issues/verbal cues  FIM - Archivist Transfers Assistive Devices: Grab bars;Prosthesis Toilet Transfers: 5-To toilet/BSC: Supervision (verbal cues/safety issues);5-From toilet/BSC: Supervision (verbal cues/safety issues)  FIM - Press photographer Assistive Devices: Arm rests;Prosthesis Bed/Chair Transfer: 6: Supine > Sit: No assist;6: Sit > Supine: No assist;5: Bed > Chair or W/C: Supervision (verbal cues/safety issues);5: Chair or W/C > Bed: Supervision (verbal cues/safety issues)  FIM - Locomotion: Wheelchair Distance: 150 Locomotion: Wheelchair: 5: Travels 150 ft or more: maneuvers on rugs and over door sills with supervision, cueing or coaxing FIM - Locomotion: Ambulation Locomotion: Ambulation Assistive Devices:  Parallel bars Ambulation/Gait Assistance: Not tested (comment) Locomotion: Ambulation: 0: Activity did not occur  Comprehension Comprehension  Mode: Auditory Comprehension: 5-Follows basic conversation/direction: With no assist  Expression Expression Mode: Verbal Expression: 5-Expresses basic 90% of the time/requires cueing < 10% of the time.  Social Interaction Social Interaction: 6-Interacts appropriately with others with medication or extra time (anti-anxiety, antidepressant).  Problem Solving Problem Solving: 4-Solves basic 75 - 89% of the time/requires cueing 10 - 24% of the time  Memory Memory: 4-Recognizes or recalls 75 - 89% of the time/requires cueing 10 - 24% of the time Medical Problem List and Plan:  1. Right BKA 08/08/2012/history of left BKA  -has prosthesis with him  -stump Shrinker right residual limb, wound looking good, still swollen 2. DVT Prophylaxis/Anticoagulation: Subcutaneous Lovenox. Monitor platelet counts  3. Pain Management: Hydrocodone prn. Monitor mental status (see below)  4. Mood/postoperative confusion. Limited narcotics and monitor with increased mobility-  -pt feeling that he's close to baseline.   5. Neuropsych: This patient is capable of making decisions on his own behalf.  6. Acute blood loss anemia. Followup CBC and monitor for any bleeding episodes. Wound remains without drainage at present   7. Chronic renal insufficiency. Baseline creatinine 2.6-2.9. Followup chemistries consistent 8. Diabetes mellitus with peripheral neuropathy. Hemoglobin A1c 6.8. Levemir 18 units daily. Check blood sugars a.c. and at bedtime. Adjust regimen as needed moving forward.  9. Hypertension. Elevated systolic Norvasc 5 mg daily, increase Cardura to  1.0 mg each bedtime. Monitor with increased mobility  10. Hyperlipidemia. Lipitor  12. Coronary artery disease with history of myocardial infarction. Continue Plavix/aspirin. No complaints of chest pain   LOS (Days)  7 A FACE TO FACE EVALUATION WAS PERFORMED  Almon Whitford E 08/18/2012 10:10 AM

## 2012-08-18 NOTE — Progress Notes (Signed)
Physical Therapy Weekly Progress Note  Patient Details  Name: Aaron Long MRN: [id number] Date of Birth: 12-Mar-1940  Today's Date: 08/18/2012 Time: 8:30-9:30 ( )   Patient has met 3 of 5 short term goals.  Gait goal was discontinued due to pt no longer wishing to be ambulatory short distances at DC, and pt making great progress toward WC goal, still needing cues for set-up and safety at this time.   Patient continues to demonstrate the following deficits: strength and decreased safety with functional transfers and therefore will continue to benefit from skilled PT intervention to enhance overall performance with activity tolerance, balance and awareness.  Patient progressing toward long term goals..  Continue plan of care.  PT Short Term Goals Week 1:  PT Short Term Goal 1 (Week 1): Pt will required S for rolling and supine to sit, sit to supine without use of siderail and head of bed flat.  PT Short Term Goal 1 - Progress (Week 1): Met PT Short Term Goal 2 (Week 1): Pt will demonstrate transfs w/c to bed, bed to w/c with LRAD and supervision.  PT Short Term Goal 2 - Progress (Week 1): Met PT Short Term Goal 3 (Week 1): Pt will propel w/c greater than 150 feet indepdently.  PT Short Term Goal 3 - Progress (Week 1): Progressing toward goal PT Short Term Goal 4 (Week 1): Pt will ambulate with rolling walker and mod Ax1, with L prosthesis ~ 15 feet.  PT Short Term Goal 4 - Progress (Week 1): Discontinued (comment) (DC'd due to change in POC) PT Short Term Goal 5 (Week 1): Pt will increase R LE strength to at least 3+/5 throughout.  PT Short Term Goal 5 - Progress (Week 1): Met Week 2:  PT Short Term Goal 1 (Week 2): STG=LTG, Mod I/S overall  Skilled Therapeutic Interventions/Progress Updates:  TX focused on safety with transfers.  Pt engaged in bed mobility Mod I with increased time required.  Dynamic balance sitting EOB demonstrated with Mod I while donning prosthesis x38min.   Lateral scoot transfers bed>WC>toilet>WC<>mat with close S and cues for set-up and safety. Pt still needing cues for WC parts management, moving arm rest, and efficiency as to not rest in the gap during transfer, but rather fullly scoot to edge, then complete with one lift safely to other surface.  Pt propelled WC x150' in controlled environment, c/o fatigue but not needing rest.  Provided handout and demonstrated HEP for amputee. Pt completed each x10 with good technique.       Therapy Documentation Precautions:  Precautions Precautions: Fall;Other (comment) Precaution Comments: LE precautions Restrictions Weight Bearing Restrictions: Yes RLE Weight Bearing:  (amputated leg)  Pain: None    See FIM for current functional status  Therapy/Group: Individual Therapy Clydene Laming, PT, DPT  08/18/2012, 8:43 AM

## 2012-08-18 NOTE — Progress Notes (Signed)
Social Work Patient ID: Aaron Long, male   DOB: 1940-10-27, 72 y.o.   MRN: [id number] Met with to pt and wife when here to discuss if discharge will work on Monday, wife has moved her dental appointment so it is fine. Family education being completed today and wife brought pt's wheelchair in.  Pt pleased to be going home Monday.

## 2012-08-18 NOTE — Plan of Care (Signed)
Problem: RH SKIN INTEGRITY Goal: RH STG SKIN FREE OF INFECTION/BREAKDOWN Outcome: Not Progressing Open area to sacrum. Allevyn dressing intact.

## 2012-08-18 NOTE — Progress Notes (Signed)
Occupational Therapy Session Note  Patient Details  Name: Aaron Long MRN: [id number] Date of Birth: 07-May-1940  Today's Date: 08/18/2012 Time: 4098-1191 Time Calculation (min): 30 min  Short Term Goals: Week 2:  OT Short Term Goal 1 (Week 2): STGs = LTGs  Skilled Therapeutic Interventions/Progress Updates:  Patient resting in w/c from home and wife at his bedside.  Patient propelled himself to therapy gym.  Patient and wife concerned about current placement of amputee pad that was placed on today.  Adjusted pad to accommodate comfort and optimal positioning of right residual limb.  Patient and wife report that the bed at home has been lowered to better accommodate safe transfers.  Upper body strengthening performed with patient propelling w/c backward then forward while pulling a load!  Therapy Documentation Precautions:  Precautions Precautions: Fall;Other (comment) Precaution Comments: LE precautions Restrictions Weight Bearing Restrictions: Yes RLE Weight Bearing:  (amputated leg) Pain: Denies pain  Therapy/Group: Individual Therapy  Iyonnah Ferrante 08/18/2012, 5:42 PM

## 2012-08-18 NOTE — Plan of Care (Signed)
Problem: RH SKIN INTEGRITY Goal: RH STG MAINTAIN SKIN INTEGRITY WITH ASSISTANCE STG Maintain Skin Integrity With min Assistance.  Outcome: Progressing Requires placement of allevyn dressing to sacrum by staff or family member. Able to self turn without assistance

## 2012-08-18 NOTE — Plan of Care (Signed)
Problem: RH BLADDER ELIMINATION Goal: RH STG MANAGE BLADDER WITH ASSISTANCE STG Manage Bladder With min Assistance  Outcome: Progressing Uses urinal. Requires staff to remove and empty.

## 2012-08-18 NOTE — Progress Notes (Signed)
Occupational Therapy Session Note  Patient Details  Name: Aaron Long MRN: [id number] Date of Birth: [birth year]  Today's Date: 08/18/2012 Time: 1300-1330 Time Calculation (min): 30 min  Short Term Goals: Week 1:  OT Short Term Goal 1 (Week 1): Pt. will do lateral leans for pericare with SBA OT Short Term Goal 1 - Progress (Week 1): Met OT Short Term Goal 2 (Week 1): Pt. will transfer to toilet with min assist OT Short Term Goal 2 - Progress (Week 1): Met OT Short Term Goal 3 (Week 1): Pt. will stand  at sink for 30 seconds with min assist for balance.   OT Short Term Goal 3 - Progress (Week 1): Progressing toward goal  Skilled Therapeutic Interventions/Progress Updates:    1:1 Wife present for session: education and demonstrated car transfers in and out of car squat pivot- discussed importance of wife being present to assist with holding w/c from moving and ensuring pt's pants doesnt get caught on extended brake. Discussed importance of not bumping bottom on wheel. Pt performed furniture transfer the same way with supervision with A for holding w/c  Therapy Documentation Precautions:  Precautions Precautions: Fall;Other (comment) Precaution Comments: LE precautions Restrictions Weight Bearing Restrictions: Yes RLE Weight Bearing:  (amputated leg) Pain:  no c/o pain   See FIM for current functional status  Therapy/Group: Individual Therapy  Roney Mans [facility] 08/18/2012, 2:56 PM

## 2012-08-18 NOTE — Plan of Care (Signed)
Problem: RH BOWEL ELIMINATION Goal: RH STG MANAGE BOWEL WITH ASSISTANCE STG Manage Bowel with min Assistance.  Outcome: Progressing Pt assisted to toilet with assist of therapist. Pt continent.

## 2012-08-18 NOTE — Progress Notes (Signed)
Occupational Therapy Weekly Progress Note and Skilled Intervention  Patient Details  Name: Aaron Long MRN: [id number] Date of Birth: Jan 12, 1941  Today's Date: 08/18/2012 Time: 1000-1100 Time Calculation (min): 60 min  Patient has met 2 of 3 short term goals.  Pt is now supervision with his self care from bed level (LTGs met). Pt did not meet the STG of standing for 30 seconds based on his preference.  He stated that he would rather work on that in out patient therapy.  Pt has instead been using lateral leans for LB self care. Patient will continue to benefit from skilled OT intervention to reinforce progress with overall performance with BADL and to work on bathing and dressing from the wide drop arm BSC versus the bed to avoid having wet sheets.  Patient progressing toward long term goals..  Continue plan of care.  LTG of tub bench transfers and bathing on tub bench was discontinued 08/16/12 as pt and his wife stated that his w/c can not fit in bathroom door and he will have to take sponge baths from EOB or from [facility].  OT Short Term Goals Week 1:  OT Short Term Goal 1 (Week 1): Pt. will do lateral leans for pericare with SBA OT Short Term Goal 1 - Progress (Week 1): Met OT Short Term Goal 2 (Week 1): Pt. will transfer to toilet with min assist OT Short Term Goal 2 - Progress (Week 1): Met OT Short Term Goal 3 (Week 1): Pt. will stand  at sink for 30 seconds with min assist for balance.   OT Short Term Goal 3 - Progress (Week 1): Progressing toward goal Week 2:  OT Short Term Goal 1 (Week 2): STGs = LTGs  Skilled Therapeutic Interventions/Progress Updates:    Pt seen for BADL retraining of bathing and dressing with a focus on activity tolerance, use of lateral leans, and dynamic sitting balance. Pt completed his self care from chair level today and discussed the need for him to practice it from [facility] to avoid worrying about getting the wheelchair cushion or the sheets wet (if he bathes EOB).   Pt complete self care and then propelled self in w/c to gym. He worked on [employer] for 10 min at level 8 and then propelled himself back to his room.  Continue OT 5-7 days a week 1-2x a day for 60-90 minutes with Balance/vestibular training;DME/adaptive equipment instruction;Functional mobility training;Patient/family education;Self Care/advanced ADL retraining;Therapeutic Exercise;Therapeutic Activities;UE/LE Strength taining/ROM to maximize his independence with ADLs.  Therapy Documentation Precautions:  Precautions Precautions: Fall;Other (comment) Precaution Comments: LE precautions Restrictions Weight Bearing Restrictions: Yes RLE Weight Bearing:  (amputated leg)  Pain: Pain Assessment Pain Assessment: No/denies pain ADL:  See FIM for current functional status  Therapy/Group: Individual Therapy  SAGUIER,JULIA 08/18/2012, 10:32 AM

## 2012-08-19 ENCOUNTER — Inpatient Hospital Stay (HOSPITAL_COMMUNITY): Payer: Medicare Other | Admitting: Physical Therapy

## 2012-08-19 LAB — GLUCOSE, CAPILLARY
Glucose-Capillary: 185 mg/dL — ABNORMAL HIGH (ref 70–99)
Glucose-Capillary: 110 mg/dL — ABNORMAL HIGH (ref 70–99)

## 2012-08-19 NOTE — Progress Notes (Signed)
Physical Therapy Session Note  Patient Details  Name: Aaron Long MRN: [id number] Date of Birth: [birth year]  Today's Date: 08/19/2012 Time: 9562-1308 Time Calculation (min): 48 min  Short Term Goals: Week 1:  PT Short Term Goal 1 (Week 1): Pt will required S for rolling and supine to sit, sit to supine without use of siderail and head of bed flat.  PT Short Term Goal 1 - Progress (Week 1): Met PT Short Term Goal 2 (Week 1): Pt will demonstrate transfs w/c to bed, bed to w/c with LRAD and supervision.  PT Short Term Goal 2 - Progress (Week 1): Met PT Short Term Goal 3 (Week 1): Pt will propel w/c greater than 150 feet indepdently.  PT Short Term Goal 3 - Progress (Week 1): Progressing toward goal PT Short Term Goal 4 (Week 1): Pt will ambulate with rolling walker and mod Ax1, with L prosthesis ~ 15 feet.  PT Short Term Goal 4 - Progress (Week 1): Discontinued (comment) (DC'd due to change in POC) PT Short Term Goal 5 (Week 1): Pt will increase R LE strength to at least 3+/5 throughout.  PT Short Term Goal 5 - Progress (Week 1): Met Week 2:  PT Short Term Goal 1 (Week 2): STG=LTG, Mod I/S overall  Skilled Therapeutic Interventions/Progress Updates:   Pt's wife not present for this session for continued family education but reports she will be present tomorrow for therapy at 11am.  Pt requesting to use toilet.  Pt was able to verbalize entire sequence to therapist and guided donning of L prosthesis, set up of w/c by bed, performed lateral scooting bed > w/c <> toilet with supervision, set up of w/c next to toilet and toileting sequence of removing old brief and donning clean brief and clean shorts with pt completing all hygiene.  Following toileting reviewed HEP with patient and pt performed 3 exercises seated in w/c: 20 reps each glute sets, SAQ and LAQ with HS curl with use of handout as reference.  Pt to remain in w/c and perform grooming at sink.    Therapy Documentation Precautions:   Precautions Precautions: Fall;Other (comment) Precaution Comments: LE precautions Restrictions Weight Bearing Restrictions: Yes RLE Weight Bearing:  (amputated leg) Vital Signs: Therapy Vitals Pulse Rate: 89 BP: 168/78 mmHg Pain: Pain Assessment Pain Assessment: No/denies pain  See FIM for current functional status  Therapy/Group: Individual Therapy  Edman Circle [facility] 08/19/2012, 11:52 AM

## 2012-08-19 NOTE — Progress Notes (Signed)
Patient ID: ARKEEM HARTS, male   DOB: 01-17-1940, 72 y.o.   MRN: [id number] Subjective/Complaints: Rested fairly well. Pain for the most part under control. A 12 point review of systems has been performed and if not noted above is otherwise negative.   Objective: Vital Signs: Blood pressure 168/78, pulse 89, temperature 98 F (36.7 C), temperature source Oral, resp. rate 19, height 5\' 8"  (1.727 m), weight 80.287 kg (177 lb), SpO2 97.00%. No results found. No results found for this basename: WBC, HGB, HCT, PLT,  in the last 72 hours  Recent Labs  08/18/12 0600  CREATININE 2.60*   CBG (last 3)   Recent Labs  08/18/12 1652 08/18/12 2055 08/19/12 0807  GLUCAP 215* 141* 161*    Wt Readings from Last 3 Encounters:  08/16/12 80.287 kg (177 lb)  08/07/12 96.163 kg (212 lb)  08/07/12 96.163 kg (212 lb)    Physical Exam:  HENT: oral mucosa pink and moist  Head: Normocephalic. Dentition fair  Eyes:  Pupils reactive to light  Neck: Normal range of motion. Neck supple. No thyromegaly present.  Cardiovascular: Normal rate and regular rhythm. No murmur or rub  Pulmonary/Chest: Effort normal and breath sounds normal. No respiratory distress. No wheezes rales or rhonchi  Abdominal: Soft. Bowel sounds are normal. He exhibits no distension.  Neurological: He is alert.  Patient alert and Oriented x 4. . UE's 5/5. RLE is 2/5(pain). LLE is 4-5/5. No gross sensory changes. Good insight and awareness.  Skin:  Left BKA is healed. Right BKA is intact with significant swelling. remains tender , Stump edema, incision CDI Neuro:  Eyes without evidence of nystagmus    Assessment/Plan: 1. Functional deficits secondary to right BKA (prior left BKA) which require 3+ hours per day of interdisciplinary therapy in a comprehensive inpatient rehab setting. Physiatrist is providing close team supervision and 24 hour management of active medical problems listed below. Physiatrist and rehab team  continue to assess barriers to discharge/monitor patient progress toward functional and medical goals. FIM: FIM - Bathing Bathing Steps Patient Completed: Chest;Right Arm;Left Arm;Abdomen;Front perineal area;Buttocks;Right upper leg;Left upper leg Bathing: 5: Supervision: Safety issues/verbal cues  FIM - Upper Body Dressing/Undressing Upper body dressing/undressing steps patient completed: Thread/unthread right sleeve of pullover shirt/dresss;Thread/unthread left sleeve of pullover shirt/dress;Put head through opening of pull over shirt/dress;Pull shirt over trunk Upper body dressing/undressing: 5: Set-up assist to: Obtain clothing/put away FIM - Lower Body Dressing/Undressing Lower body dressing/undressing steps patient completed: Thread/unthread right pants leg;Thread/unthread left pants leg;Pull pants up/down Lower body dressing/undressing: 5: Supervision: Safety issues/verbal cues  FIM - Toileting Toileting steps completed by patient: Adjust clothing prior to toileting;Performs perineal hygiene;Adjust clothing after toileting Toileting Assistive Devices: Grab bar or rail for support Toileting: 5: Supervision: Safety issues/verbal cues  FIM - Archivist Transfers Assistive Devices: Grab bars;Prosthesis Toilet Transfers: 5-To toilet/BSC: Supervision (verbal cues/safety issues);5-From toilet/BSC: Supervision (verbal cues/safety issues)  FIM - Press photographer Assistive Devices: Arm rests;Prosthesis Bed/Chair Transfer: 6: Supine > Sit: No assist;6: Sit > Supine: No assist;5: Bed > Chair or W/C: Supervision (verbal cues/safety issues);5: Chair or W/C > Bed: Supervision (verbal cues/safety issues)  FIM - Locomotion: Wheelchair Distance: 150 Locomotion: Wheelchair: 5: Travels 150 ft or more: maneuvers on rugs and over door sills with supervision, cueing or coaxing FIM - Locomotion: Ambulation Locomotion: Ambulation Assistive Devices: Parallel  bars Ambulation/Gait Assistance: Not tested (comment) Locomotion: Ambulation: 0: Activity did not occur  Comprehension Comprehension Mode: Auditory Comprehension: 5-Follows basic conversation/direction: With  no assist  Expression Expression Mode: Verbal Expression: 5-Expresses basic 90% of the time/requires cueing < 10% of the time.  Social Interaction Social Interaction: 6-Interacts appropriately with others with medication or extra time (anti-anxiety, antidepressant).  Problem Solving Problem Solving: 5-Solves basic 90% of the time/requires cueing < 10% of the time  Memory Memory: 5-Recognizes or recalls 90% of the time/requires cueing < 10% of the time Medical Problem List and Plan:  1. Right BKA 08/08/2012/history of left BKA  -has prosthesis with him  -stump Shrinker right residual limb, wound looking good, still swollen 2. DVT Prophylaxis/Anticoagulation: Subcutaneous Lovenox. Monitor platelet counts  3. Pain Management: Hydrocodone prn. Monitor mental status (see below)  4. Mood/postoperative confusion. Limited narcotics and monitor with increased mobility-  -pt feeling that he's close to baseline.   5. Neuropsych: This patient is capable of making decisions on his own behalf.  6. Acute blood loss anemia. Followup CBC and monitor for any bleeding episodes. Wound remains without drainage at present   7. Chronic renal insufficiency. Baseline creatinine 2.6-2.9. Followup chemistries consistent 8. Diabetes mellitus with peripheral neuropathy. Hemoglobin A1c 6.8. Levemir 18 units daily. Check blood sugars a.c. and at bedtime. Adjust regimen as needed moving forward.  9. Hypertension. Elevated systolic ToprolXL 12.5 mg, Norvasc 5 mg daily, increase Cardura to  1.0 mg each bedtime. Monitor with increased mobility  10. Hyperlipidemia. Lipitor  12. Coronary artery disease with history of myocardial infarction. Continue Plavix/aspirin. No complaints of chest pain   LOS (Days) 8 A  FACE TO FACE EVALUATION WAS PERFORMED  Claudette Laws E 08/19/2012 11:05 AM

## 2012-08-20 ENCOUNTER — Inpatient Hospital Stay (HOSPITAL_COMMUNITY): Payer: Medicare Other | Admitting: Physical Therapy

## 2012-08-20 LAB — GLUCOSE, CAPILLARY
Glucose-Capillary: 183 mg/dL — ABNORMAL HIGH (ref 70–99)
Glucose-Capillary: 134 mg/dL — ABNORMAL HIGH (ref 70–99)

## 2012-08-20 MED ORDER — HYDROCODONE-ACETAMINOPHEN 5-325 MG PO TABS
1.0000 | ORAL_TABLET | ORAL | Status: DC | PRN
  Administered 2012-08-21: 1 via ORAL
  Filled 2012-08-20: qty 1

## 2012-08-20 NOTE — Progress Notes (Signed)
Patient ID: Aaron Long, male   DOB: 24-Oct-1940, 72 y.o.   MRN: [id number] Subjective/Complaints: Rested fairly well. Pain for the most part under control. Looking forward to discharge in the morning A 12 point review of systems has been performed and if not noted above is otherwise negative.   Objective: Vital Signs: Blood pressure 151/77, pulse 80, temperature 97.6 F (36.4 C), temperature source Oral, resp. rate 17, height 5\' 8"  (1.727 m), weight 80.287 kg (177 lb), SpO2 97.00%. No results found. No results found for this basename: WBC, HGB, HCT, PLT,  in the last 72 hours  Recent Labs  08/18/12 0600  CREATININE 2.60*   CBG (last 3)   Recent Labs  08/19/12 1649 08/19/12 2045 08/20/12 0741  GLUCAP 110* 183* 134*    Wt Readings from Last 3 Encounters:  08/16/12 80.287 kg (177 lb)  08/07/12 96.163 kg (212 lb)  08/07/12 96.163 kg (212 lb)    Physical Exam:  HENT: oral mucosa pink and moist  Head: Normocephalic. Dentition fair  Eyes:  Pupils reactive to light  Neck: Normal range of motion. Neck supple. No thyromegaly present.  Cardiovascular: Normal rate and regular rhythm. No murmur or rub  Pulmonary/Chest: Effort normal and breath sounds normal. No respiratory distress. No wheezes rales or rhonchi  Abdominal: Soft. Bowel sounds are normal. He exhibits no distension.  Neurological: He is alert.  Patient alert and Oriented x 4. . UE's 5/5.Proximal RLE is 3/5. LLE is 4-5/5. No gross sensory changes. Good insight and awareness.  Skin:  Left BKA is healed. Right BKA is intact with significant swelling. remains tender , Stump edema, incision CDI Neuro:  Eyes without evidence of nystagmus    Assessment/Plan: 1. Functional deficits secondary to right BKA (prior left BKA) which require 3+ hours per day of interdisciplinary therapy in a comprehensive inpatient rehab setting. Physiatrist is providing close team supervision and 24 hour management of active medical  problems listed below. Physiatrist and rehab team continue to assess barriers to discharge/monitor patient progress toward functional and medical goals. FIM: FIM - Bathing Bathing Steps Patient Completed: Chest;Right Arm;Left Arm;Abdomen;Front perineal area;Buttocks;Right upper leg;Left upper leg Bathing: 5: Supervision: Safety issues/verbal cues  FIM - Upper Body Dressing/Undressing Upper body dressing/undressing steps patient completed: Thread/unthread right sleeve of pullover shirt/dresss;Thread/unthread left sleeve of pullover shirt/dress;Put head through opening of pull over shirt/dress;Pull shirt over trunk Upper body dressing/undressing: 5: Set-up assist to: Obtain clothing/put away FIM - Lower Body Dressing/Undressing Lower body dressing/undressing steps patient completed: Thread/unthread right pants leg;Thread/unthread left pants leg;Pull pants up/down Lower body dressing/undressing: 5: Supervision: Safety issues/verbal cues  FIM - Toileting Toileting steps completed by patient: Performs perineal hygiene Toileting Assistive Devices: Grab bar or rail for support Toileting: 2: Max-Patient completed 1 of 3 steps  FIM - Diplomatic Services operational officer Devices: Grab bars;Prosthesis Toilet Transfers: 5-To toilet/BSC: Supervision (verbal cues/safety issues);5-From toilet/BSC: Supervision (verbal cues/safety issues)  FIM - Press photographer Assistive Devices: Arm rests;Prosthesis Bed/Chair Transfer: 6: Supine > Sit: No assist;6: Sit > Supine: No assist;5: Bed > Chair or W/C: Supervision (verbal cues/safety issues);5: Chair or W/C > Bed: Supervision (verbal cues/safety issues)  FIM - Locomotion: Wheelchair Distance: 150 Locomotion: Wheelchair: 5: Travels 150 ft or more: maneuvers on rugs and over door sills with supervision, cueing or coaxing FIM - Locomotion: Ambulation Locomotion: Ambulation Assistive Devices: Parallel bars Ambulation/Gait Assistance:  Not tested (comment) Locomotion: Ambulation: 0: Activity did not occur  Comprehension Comprehension Mode: Auditory Comprehension: 5-Follows basic  conversation/direction: With no assist  Expression Expression Mode: Verbal Expression: 5-Expresses basic 90% of the time/requires cueing < 10% of the time.  Social Interaction Social Interaction: 6-Interacts appropriately with others with medication or extra time (anti-anxiety, antidepressant).  Problem Solving Problem Solving: 5-Solves basic problems: With no assist  Memory Memory: 6-More than reasonable amt of time Medical Problem List and Plan:  1. Right BKA 08/08/2012/history of left BKA  -has prosthesis with him  -stump Shrinker right residual limb, wound looking good, still swollen 2. DVT Prophylaxis/Anticoagulation: Subcutaneous Lovenox. Monitor platelet counts  3. Pain Management: Hydrocodone prn. Monitor mental status (see below)  4. Mood/postoperative confusion. Limited narcotics and monitor with increased mobility-  -pt feeling that he's close to baseline.   5. Neuropsych: This patient is capable of making decisions on his own behalf.  6. Acute blood loss anemia. Followup CBC and monitor for any bleeding episodes. Wound remains without drainage at present   7. Chronic renal insufficiency. Baseline creatinine 2.6-2.9. Followup chemistries consistent 8. Diabetes mellitus with peripheral neuropathy. Hemoglobin A1c 6.8. Levemir 18 units daily. Check blood sugars a.c. and at bedtime. Adjust regimen as needed moving forward.  9. Hypertension. Elevated systolic ToprolXL 12.5 mg, Norvasc 5 mg daily, increase Cardura to  1.0 mg each bedtime. Monitor with increased mobility  10. Hyperlipidemia. Lipitor  12. Coronary artery disease with history of myocardial infarction. Continue Plavix/aspirin. No complaints of chest pain   LOS (Days) 9 A FACE TO FACE EVALUATION WAS PERFORMED  Annaliyah Willig E 08/20/2012 10:30 AM

## 2012-08-20 NOTE — Progress Notes (Signed)
Physical Therapy Note  Patient Details  Name: KIANDRE SPAGNOLO MRN: [id number] Date of Birth: 1940-07-19 Today's Date: 08/20/2012  1100-1155 (55 minutes) individual Pain: no reported pain Focus of treatment: Therapeutic exercises focused on bilateral BKA strengthening/ review home exercise program Treatment: Pt up in wc ; wc mobility- 150 feet X 2 SBA on level surfaces; transfers - independent with setup / SBA for scoot transfer using left prosthesis; supine >< sit independent ; prone hip flexor stretch X 5 minutes; bilateral BKA exercises X 20 - glut sets, quad sets , hip flexion/extension; hip abduction in sidelying.   Willow Reczek,JIM 08/20/2012, 11:08 AM

## 2012-08-21 ENCOUNTER — Inpatient Hospital Stay (HOSPITAL_COMMUNITY): Payer: BLUE CROSS/BLUE SHIELD | Admitting: *Deleted

## 2012-08-21 ENCOUNTER — Inpatient Hospital Stay (HOSPITAL_COMMUNITY): Payer: Medicare Other | Admitting: Physical Therapy

## 2012-08-21 ENCOUNTER — Inpatient Hospital Stay (HOSPITAL_COMMUNITY): Payer: Medicare Other | Admitting: Occupational Therapy

## 2012-08-21 ENCOUNTER — Inpatient Hospital Stay (HOSPITAL_COMMUNITY): Payer: BLUE CROSS/BLUE SHIELD | Admitting: Rehabilitation

## 2012-08-21 DIAGNOSIS — I739 Peripheral vascular disease, unspecified: Secondary | ICD-10-CM

## 2012-08-21 DIAGNOSIS — IMO0001 Reserved for inherently not codable concepts without codable children: Secondary | ICD-10-CM

## 2012-08-21 DIAGNOSIS — E1165 Type 2 diabetes mellitus with hyperglycemia: Secondary | ICD-10-CM

## 2012-08-21 DIAGNOSIS — N186 End stage renal disease: Secondary | ICD-10-CM

## 2012-08-21 DIAGNOSIS — L98499 Non-pressure chronic ulcer of skin of other sites with unspecified severity: Secondary | ICD-10-CM

## 2012-08-21 DIAGNOSIS — S88119A Complete traumatic amputation at level between knee and ankle, unspecified lower leg, initial encounter: Secondary | ICD-10-CM

## 2012-08-21 LAB — GLUCOSE, CAPILLARY: Glucose-Capillary: 161 mg/dL — ABNORMAL HIGH (ref 70–99)

## 2012-08-21 MED ORDER — AMLODIPINE BESYLATE 5 MG PO TABS: 5.0000 mg | ORAL_TABLET | Freq: Every day | ORAL | Status: DC

## 2012-08-21 MED ORDER — PANTOPRAZOLE SODIUM 40 MG PO TBEC: 40.0000 mg | DELAYED_RELEASE_TABLET | Freq: Every day | ORAL | Status: DC

## 2012-08-21 MED ORDER — CLOPIDOGREL BISULFATE 75 MG PO TABS: 75.0000 mg | ORAL_TABLET | Freq: Every day | ORAL | Status: DC

## 2012-08-21 MED ORDER — ATORVASTATIN CALCIUM 10 MG PO TABS: 10.0000 mg | ORAL_TABLET | Freq: Every day | ORAL | Status: DC

## 2012-08-21 MED ORDER — PREGABALIN 75 MG PO CAPS: 75.0000 mg | ORAL_CAPSULE | Freq: Two times a day (BID) | ORAL | Status: DC

## 2012-08-21 MED ORDER — METOPROLOL SUCCINATE 12.5 MG HALF TABLET: 12.5000 mg | ORAL_TABLET | Freq: Every day | ORAL | Status: DC

## 2012-08-21 MED ORDER — DOXAZOSIN MESYLATE 1 MG PO TABS: 1.0000 mg | ORAL_TABLET | Freq: Every day | ORAL | Status: DC

## 2012-08-21 MED ORDER — INSULIN DETEMIR 100 UNIT/ML ~~LOC~~ SOLN: 18.0000 [IU] | Freq: Every day | SUBCUTANEOUS | Status: DC

## 2012-08-21 MED ORDER — HYDROCODONE-ACETAMINOPHEN 5-325 MG PO TABS: 1.0000 | ORAL_TABLET | ORAL | Status: DC | PRN

## 2012-08-21 MED ORDER — ASPIRIN EC 81 MG PO TBEC: 81.0000 mg | DELAYED_RELEASE_TABLET | Freq: Every morning | ORAL | Status: DC

## 2012-08-21 NOTE — Progress Notes (Signed)
Social Work  Discharge Note  The overall goal for the admission was met for:   Discharge location: Yes - home with wife  Length of Stay: Yes - 10 days  Discharge activity level: Yes  Home/community participation: Yes  Services provided included: MD, RD, PT, OT, RN, TR, Pharmacy and SW  Financial Services: Medicare and Private Insurance: BCBS  Follow-up services arranged: Home Health: RN, PT, OT via [facility] and Patient/Family has no preference for HH/DME agencies  Comments (or additional information):  Patient/Family verbalized understanding of follow-up arrangements: Yes  Individual responsible for coordination of the follow-up plan: patient  Confirmed correct DME delivered: NA - had all needed DME  Donisha Hoch, LCSW

## 2012-08-21 NOTE — Discharge Summary (Signed)
  Discharge summary job [id number]

## 2012-08-21 NOTE — Progress Notes (Signed)
Physical Therapy Discharge Summary  Patient Details  Name: Aaron Long MRN: [id number] Date of Birth: July 09, 1940  Today's Date: 08/21/2012  Patient has met 6 of 7 long term goals due to improved activity tolerance, improved balance, increased strength and ability to compensate for deficits.  Pt did not meet community [facility] goal as he discharged from facility prior to afternoon therapies, thus was unable to assess. Patient to discharge at a wheelchair level Supervision.   Patient's care partner is independent to provide the necessary cognitive assistance at discharge.  Reasons goals not met: Pt initially had short distance ambulation goal set, but decided he would prefer to DC at [facility] level and begin walking later, so this goal was discontinued last week.   Recommendation:  Patient will benefit from ongoing skilled PT services in home health setting to continue to advance safe functional mobility, address ongoing impairments in strength, ROM, and minimize fall risk.  Equipment: L foot plate. Pt has WC from prior admission, and we were able to move the amputee legrest, but there was no foot plate for R side, so that is all that was needed for complete chair set-up.   Reasons for discharge: treatment goals met and discharge from hospital  Patient/family agrees with progress made and goals achieved: Yes  Pt was scheduled for full day of tx today, but DC'd before final PT. However, wife came in last week for training and felt confident about transition home with no further concerns.   PT Discharge Precautions/Restrictions Precautions Precautions: Fall Precaution Comments: Right BKA precautions Required Braces or Orthoses:  (Wears L prosthetic LE) Restrictions Weight Bearing Restrictions: Yes RLE Weight Bearing: Non weight bearing    Vision/Perception  Vision - History Baseline Vision: Bifocals Patient Visual Report: No change from baseline Vision - Assessment Eye Alignment: Within  Functional Limits Vision Assessment: Vision not tested Perception Perception: Within Functional Limits Praxis Praxis: Intact  Cognition Overall Cognitive Status: Within Functional Limits for tasks assessed Orientation Level: Oriented X4 Sustained Attention: Appears intact Memory: Appears intact Awareness: Appears intact Safety/Judgment: Appears intact Comments: At times, pt with delayed processing, especially upon waking Sensation Sensation Light Touch: Appears Intact Coordination Gross Motor Movements are Fluid and Coordinated: Yes Fine Motor Movements are Fluid and Coordinated: Yes Motor  Motor Motor: Within Functional Limits  Mobility Bed Mobility Supine to Sit: 7: Independent Sit to Supine: 7: Independent Transfers Lateral/Scoot Transfers: 5: Supervision;With armrests removed Locomotion  Ambulation Ambulation: No Ambulation/Gait Assistance: Other (comment) (Pt non-ambulatory at DC) Stairs / Additional Locomotion Stairs: No Wheelchair Mobility Wheelchair Mobility: Yes Wheelchair Assistance: 5: Investment banker, operational: Both upper extremities Wheelchair Parts Management: Independent Distance: 150  Trunk/Postural Assessment  Cervical Assessment Cervical Assessment: Within Functional Limits Thoracic Assessment Thoracic Assessment: Within Functional Limits Lumbar Assessment Lumbar Assessment: Within Functional Limits Postural Control Postural Control: Within Functional Limits  Balance Balance Balance Assessed: Yes Static Sitting Balance Static Sitting - Balance Support: Right upper extremity supported;Left upper extremity supported;Feet unsupported Static Sitting - Level of Assistance: 6: Modified independent (Device/Increase time) Dynamic Sitting Balance Dynamic Sitting - Balance Support: Right upper extremity supported;Left upper extremity supported;Feet unsupported;During functional activity Dynamic Sitting - Level of Assistance: 6: Modified  independent (Device/Increase time) Extremity Assessment  RUE Assessment RUE Assessment: Within Functional Limits LUE Assessment LUE Assessment: Within Functional Limits RLE Assessment RLE Assessment: Exceptions to WFL (BKA) RLE AROM (degrees) Overall AROM Right Lower Extremity: Within functional limits for tasks assessed RLE Overall AROM Comments: WFLs at the hip and knee. but  limited hip EXT noted to neutral RLE Strength RLE Overall Strength: Deficits (R BKA) RLE Overall Strength Comments: 4/5 hip flexion, knee flexion/extension LLE Assessment LLE Assessment: Exceptions to WFL LLE AROM (degrees) Overall AROM Left Lower Extremity: Within functional limits for tasks assessed LLE Strength LLE Overall Strength: Deficits;Due to premorbid status (L BKA) LLE Overall Strength Comments: 4/5 hip flexion, knee flexion/extension  See FIM for current functional status   Clydene Laming, PT, DPT  08/21/2012, 1:33 PM

## 2012-08-21 NOTE — Progress Notes (Signed)
Occupational Therapy Session Note & Discharge Summary  Patient Details  Name: Aaron Long MRN: [id number] Date of Birth: 20-Jan-1940  Today's Date: 08/21/2012  SESSION NOTE  Time: 1025-1055 Time Calculation (min): 30 min Patient found seated in w/c upon entering room. Patient stated he was getting ready to leave and go home. Patient originally scheduled for OT 1030-1130, but patient only willing to work 30/60 minutes with therapist. Patient performed drop arm BSC transfer with supervision (patient able to safely set-up transfer and manage w/c without any verbal or physical cues/assistance from therapist). Patient then propelled self -> therapy gym where therapist gave patient theraband for UE strengthening exercises. Patient able to recall exercise taught from previous CIR stay ~1year ago. Therapist taught patient some new exercises as well. Patient with no additional questions and wife present at end of session to help discharge patient home. No bathing & dressing tasks performed during this session, patient previously completed in previous session with physical therapist.   -------------------------------------------------------------------------------------------------------------------------------------  DISCHARGE SUMMARY  Patient has met 8 of 8 long term goals due to improved activity tolerance, improved balance, postural control, ability to compensate for deficits, improved attention, improved awareness and improved coordination.  Patient to discharge at overall Supervision level.  Patient's care partner is independent to provide the necessary supervision at discharge.  Patient's wife has been present during his CIR stay for education regarding BADLs.   Reasons goals not met: n/a, all goals met at this time.   Recommendation:  Patient will benefit from ongoing skilled OT services in home health setting to continue to advance functional skills in the area of BADL, iADL and Reduce care  partner burden.  Equipment: No equipment provided  Reasons for discharge: treatment goals met and discharge from hospital  Patient/family agrees with progress made and goals achieved: Yes  Precautions/Restrictions  Precautions Precautions: Fall;Other (comment) Precaution Comments: Right BKA precautions Restrictions Weight Bearing Restrictions: Yes RLE Weight Bearing: Non weight bearing  ADL - See FIM  Vision/Perception  Vision - History Baseline Vision: Bifocals Patient Visual Report: No change from baseline Vision - Assessment Eye Alignment: Within Functional Limits Vision Assessment: Vision not tested Perception Perception: Within Functional Limits Praxis Praxis: Intact   Cognition Overall Cognitive Status: Within Functional Limits for tasks assessed Orientation Level: Oriented X4 Sustained Attention: Appears intact Memory: Appears intact Awareness: Appears intact Safety/Judgment: Appears intact  Sensation Sensation Light Touch: Appears Intact Coordination Gross Motor Movements are Fluid and Coordinated: Yes Fine Motor Movements are Fluid and Coordinated: Yes  Mobility  Bed Mobility Supine to Sit: 7: Independent Sit to Supine: 7: Independent   Trunk/Postural Assessment  Cervical Assessment Cervical Assessment: Within Functional Limits Thoracic Assessment Thoracic Assessment: Within Functional Limits Lumbar Assessment Lumbar Assessment: Within Functional Limits Postural Control Postural Control: Within Functional Limits   Extremity/Trunk Assessment RUE Assessment RUE Assessment: Within Functional Limits LUE Assessment LUE Assessment: Within Functional Limits  See FIM for current functional status  Meghanne Pletz 08/21/2012, 11:08 AM

## 2012-08-21 NOTE — Progress Notes (Signed)
Patient ID: Aaron Long, male   DOB: March 23, 1940, 72 y.o.   MRN: [id number] Subjective/Complaints: No issues overnight. A 12 point review of systems has been performed and if not noted above is otherwise negative.   Objective: Vital Signs: Blood pressure 154/79, pulse 73, temperature 98.2 F (36.8 C), temperature source Oral, resp. rate 18, height 5\' 8"  (1.727 m), weight 80.287 kg (177 lb), SpO2 97.00%. No results found. No results found for this basename: WBC, HGB, HCT, PLT,  in the last 72 hours No results found for this basename: NA, K, CL, CO, GLUCOSE, BUN, CREATININE, CALCIUM,  in the last 72 hours CBG (last 3)   Recent Labs  08/20/12 1629 08/20/12 2102 08/21/12 0738  GLUCAP 137* 154* 161*    Wt Readings from Last 3 Encounters:  08/16/12 80.287 kg (177 lb)  08/07/12 96.163 kg (212 lb)  08/07/12 96.163 kg (212 lb)    Physical Exam:  HENT: oral mucosa pink and moist  Head: Normocephalic. Dentition fair  Eyes:  Pupils reactive to light  Neck: Normal range of motion. Neck supple. No thyromegaly present.  Cardiovascular: Normal rate and regular rhythm. No murmur or rub  Pulmonary/Chest: Effort normal and breath sounds normal. No respiratory distress. No wheezes rales or rhonchi  Abdominal: Soft. Bowel sounds are normal. He exhibits no distension.  Neurological: He is alert.  Patient alert and Oriented x 4. . UE's 5/5.Proximal RLE is 3/5. LLE is 4-5/5. No gross sensory changes. Good insight and awareness.  Skin:  Left BKA is healed. Right BKA is intact with significant swelling. remains tender , Stump edema, incision CDI Neuro:  Eyes without evidence of nystagmus    Assessment/Plan: 1. Functional deficits secondary to right BKA (prior left BKA) Stable for D/C today F/u PCP in 1-2 weeks F/u PM&R 3 weeks See D/C summary See D/C instructionsFIM: FIM - Bathing Bathing Steps Patient Completed: Chest;Right upper leg;Left upper leg;Right Arm;Left Arm;Abdomen;Front  perineal area;Buttocks Bathing: 5: Supervision: Safety issues/verbal cues  FIM - Upper Body Dressing/Undressing Upper body dressing/undressing steps patient completed: Thread/unthread right sleeve of pullover shirt/dresss;Thread/unthread left sleeve of pullover shirt/dress;Put head through opening of pull over shirt/dress;Pull shirt over trunk Upper body dressing/undressing: 5: Set-up assist to: Obtain clothing/put away FIM - Lower Body Dressing/Undressing Lower body dressing/undressing steps patient completed: Thread/unthread right pants leg;Thread/unthread left pants leg;Pull pants up/down Lower body dressing/undressing: 2: Max-Patient completed 25-49% of tasks  FIM - Toileting Toileting steps completed by patient: Performs perineal hygiene Toileting Assistive Devices: Grab bar or rail for support Toileting: 2: Max-Patient completed 1 of 3 steps  FIM - Diplomatic Services operational officer Devices: Grab bars;Prosthesis Toilet Transfers: 5-To toilet/BSC: Supervision (verbal cues/safety issues);5-From toilet/BSC: Supervision (verbal cues/safety issues)  FIM - Press photographer Assistive Devices: Prosthesis Bed/Chair Transfer: 5: Set-up assist to: Apply orthosis/W/C set-up;7: Supine > Sit: No assist;7: Sit > Supine: No assist;5: Bed > Chair or W/C: Supervision (verbal cues/safety issues);5: Chair or W/C > Bed: Supervision (verbal cues/safety issues)  FIM - Locomotion: Wheelchair Distance: 150 Locomotion: Wheelchair: 5: Travels 150 ft or more: maneuvers on rugs and over door sills with supervision, cueing or coaxing FIM - Locomotion: Ambulation Locomotion: Ambulation Assistive Devices: Parallel bars Ambulation/Gait Assistance:  (non ambulatory at this time) Locomotion: Ambulation: 0: Activity did not occur  Comprehension Comprehension Mode: Auditory Comprehension: 5-Follows basic conversation/direction: With no assist  Expression Expression Mode:  Verbal Expression: 5-Expresses basic 90% of the time/requires cueing < 10% of the time.  Social Interaction Social  Interaction: 6-Interacts appropriately with others with medication or extra time (anti-anxiety, antidepressant).  Problem Solving Problem Solving: 5-Solves basic problems: With no assist  Memory Memory: 6-More than reasonable amt of time Medical Problem List and Plan:  1. Right BKA 08/08/2012/history of left BKA  -has prosthesis with him  -stump Shrinker right residual limb, wound looking good, still swollen 2. DVT Prophylaxis/Anticoagulation: Subcutaneous Lovenox. Monitor platelet counts  3. Pain Management: Hydrocodone prn. Monitor mental status (see below)  4. Mood/postoperative confusion. Limited narcotics and monitor with increased mobility-  -pt feeling that he's close to baseline.   5. Neuropsych: This patient is capable of making decisions on his own behalf.  6. Acute blood loss anemia. Followup CBC and monitor for any bleeding episodes. Wound remains without drainage at present   7. Chronic renal insufficiency. Baseline creatinine 2.6-2.9. Followup chemistries consistent 8. Diabetes mellitus with peripheral neuropathy. Hemoglobin A1c 6.8. Levemir 18 units daily. Check blood sugars a.c. and at bedtime. Adjust regimen as needed moving forward.  9. Hypertension. Elevated systolic ToprolXL 12.5 mg, Norvasc 5 mg daily, increase Cardura to  1.0 mg each bedtime. Monitor with increased mobility  10. Hyperlipidemia. Lipitor  12. Coronary artery disease with history of myocardial infarction. Continue Plavix/aspirin. No complaints of chest pain   LOS (Days) 10 A FACE TO FACE EVALUATION WAS PERFORMED  Erick Colace 08/21/2012 10:27 AM

## 2012-08-21 NOTE — Progress Notes (Signed)
Physical Therapy Note  Patient Details  Name: Aaron Long MRN: [id number] Date of Birth: 08-20-1940 Today's Date: 08/21/2012  [id number] (50 minutes) individual Pain: no complaint of pain Focus of treatment: Review bed mobility/ transfers/ wc mobility prior to DC Treatment: Pt in bed upon arrival; pt required assist to donn RT shrinker; pt donned pants in supine ; donned LT BKA prosthesis in sitting; transfers scoot to right or left Bed >< wc SBA for safety; wc >< toilet using safety bar SBA; pt removed pants with side to side movement; performed hygiene with setup only; wc mobility SBA 150 + feet on level surfaces X 2.    [name],JIM 08/21/2012, 9:38 AM

## 2012-08-21 NOTE — Plan of Care (Signed)
Problem: RH Wheelchair Mobility Goal: LTG Patient will propel w/c in community environment (PT) LTG: Patient will propel wheelchair in community environment, greater than 150 feet independently.  Outcome: Not Met (add Reason) Community WC goal not met as pt discharged from facility before his afternoon therapies today, thus was unable to assess.

## 2012-08-21 NOTE — Discharge Summary (Signed)
NAMESTANISLAUS, KALTENBACH NO.:  [id number]  MEDICAL RECORD NO.:  [id number]  LOCATION:  [id number]                        FACILITY:  [facility]  PHYSICIAN:  Erick Colace, M.D.DATE OF BIRTH:  June 15, 1940  DATE OF ADMISSION:  08/11/2012 DATE OF DISCHARGE:  08/21/2012                              DISCHARGE SUMMARY   DISCHARGE DIAGNOSES: 1. Right below-knee amputation on August 08, 2012, with history of left     left-below knee amputation. 2. Subcutaneous Lovenox for deep vein thrombosis prophylaxis. 3. Pain management. 4. Postoperative confusion, resolving. 5. Acute blood loss anemia. 6. Chronic renal insufficiency with baseline creatinine 2.6-2.9. 7. Diabetes mellitus with peripheral neuropathy. 8. Hypertension. 9. Hyperlipidemia. 10.Coronary artery disease with history of myocardial infarction.  HISTORY OF PRESENT ILLNESS:  This is a 72 year old right-handed male with history of CAD, chronic renal insufficiency,  diabetes mellitus, peripheral neuropathy, and a left below-knee amputation with inpatient rehab services June 10, 213 and June 30, 2011.  The patient used a left prosthesis prior to admission as well as a walker.  Admitted August 07, 2012, with nonhealing wound, gangrenous changes right foot, and recent right first toe amputation.  Recent x-rays of the foot showed early signs of osteomyelitis.  Underwent right below-knee amputation August 08, 2012, per Dr. Darrick Penna.  Postoperative pain management.  Bouts of confusion improved with limited narcotics.  JP drain removed August 10, 2012.  Maintained on subcutaneous Lovenox for DVT prophylaxis.  Acute blood loss anemia 7.8, and monitor with no chest pain or shortness of breath.  Physical and occupational therapy ongoing.  The patient was admitted for comprehensive rehab program.  PAST MEDICAL HISTORY:  See discharge diagnoses.  SOCIAL HISTORY:  Lives with spouse.  FUNCTIONAL HISTORY PRIOR TO ADMISSION:  Used a left  prosthesis.  FUNCTIONAL STATUS UPON ADMISSION TO REHAB SERVICES:  +2 total assist for anterior-posterior transfers.  PHYSICAL EXAMINATION:  VITAL SIGNS:  Blood pressure 143/55, pulse 102, temperature 99, respirations 18. GENERAL:  This was an alert male.  He was not able to participate with exam.  He was able to name person, place, and situation. EYES:  Pupils reactive to light. LUNGS:  Clear breath sounds. CARDIAC:  Rate controlled. ABDOMEN:  Soft, nontender.  Good bowel sounds. EXTREMITIES:  Left below-knee amputation site was healed.  Right below- knee amputation site intact with significant swelling.  REHABILITATION HOSPITAL COURSE:  The patient was admitted to inpatient rehab services with therapies initiated on a 3-hour daily basis consisting of physical therapy, occupational therapy, and rehabilitation nursing.  The following issues were addressed during the patient's rehabilitation stay.  Pertaining to Mr. Santillo's right below-knee amputation on August 08, 2012, surgical site intact, neurovascular sensation intact.  He would follow up with Vascular Surgery, Dr. Darrick Penna. Stump shrinker right residual limb.  He did have a history of left below- knee amputation with prosthesis.  Subcutaneous Lovenox for DVT prophylaxis.  No bleeding episodes.  Pain management with the use of hydrocodone and monitored closely for any increased signs of confusion. He did have acute blood loss anemia related to his surgery.  Latest hemoglobin stable at 8.3 and monitored.  Chronic renal insufficiency. Baseline creatinine 2.6-2.9 and monitored, showing  no signs of fluid overload.  He did have a history of diabetes mellitus, peripheral neuropathy.  Hemoglobin A1c 6.8.  He remained on Levemir.  Blood pressures monitored with Toprol-XL, Norvasc, and Cardura with no headache or dizziness.  He would follow up with his primary MD.  He did have a history of coronary artery disease with myocardial  infarction. He remained on aspirin and Plavix therapy.  The patient received weekly collaborative interdisciplinary team conferences to discuss estimated length of stay, family teaching, and any barriers to his discharge.  He was continent of bowel and bladder.  Min to moderate assist overall for activities of daily living, minimal assist with sliding board transfers, propelling his wheelchair 40 feet with supervision ambulating 3 feet in the parallel bars.  His strength and endurance did continue to improve. Full family teaching was completed and ultimate plan was to be discharged to home with family and ongoing therapies.  DISCHARGE MEDICATIONS: 1. Norvasc 5 mg p.o. daily. 2. Aspirin 81 mg p.o. daily. 3. Lipitor 10 mg p.o. daily. 4. Plavix 75 mg p.o. daily. 5. Cardura 1 mg p.o. at bedtime. 6. Hydrocodone 1 tablet every 4 hours as needed for pain, dispensed 60     tablets. 7. Levemir 18 units subcutaneous daily. 8. Toprol-XL 12.5 mg p.o. daily. 9. Protonix 40 mg p.o. daily. 10.MiraLax 17 g p.o. b.i.d. hold for loose stool. 11.Lyrica 75 mg p.o. b.i.d.  DIET:  Diabetic diet.  SPECIAL INSTRUCTIONS:  Follow up Dr. Darrick Penna in 2 weeks for removal of staples.  Call if any increased redness, drainage, or fever.  Kerlix covered with Ace wrap to right below-knee amputation site change daily as needed. Ongoing therapies arranged as per [employer].  The patient would follow up Dr. Claudette Laws at the outpatient rehab clinic September 15, 2012.  Patient with arrangements to be made for primary MD.  Follow up with Dr. Glade Lloyd.     Mariam Dollar, P.A.   ______________________________ Erick Colace, M.D.    DA/MEDQ  D:  08/21/2012  T:  08/21/2012  Job:  [id number]  cc:   Janetta Hora. Darrick Penna, MD Oneal Grout, MD

## 2012-08-22 ENCOUNTER — Ambulatory Visit: Payer: Medicare Other | Admitting: Internal Medicine

## 2012-08-22 DIAGNOSIS — I129 Hypertensive chronic kidney disease with stage 1 through stage 4 chronic kidney disease, or unspecified chronic kidney disease: Secondary | ICD-10-CM | POA: Diagnosis not present

## 2012-08-22 DIAGNOSIS — Z0289 Encounter for other administrative examinations: Secondary | ICD-10-CM

## 2012-08-22 DIAGNOSIS — I96 Gangrene, not elsewhere classified: Secondary | ICD-10-CM | POA: Diagnosis not present

## 2012-08-22 DIAGNOSIS — N189 Chronic kidney disease, unspecified: Secondary | ICD-10-CM | POA: Diagnosis not present

## 2012-08-22 DIAGNOSIS — Z794 Long term (current) use of insulin: Secondary | ICD-10-CM | POA: Diagnosis not present

## 2012-08-22 DIAGNOSIS — I2510 Atherosclerotic heart disease of native coronary artery without angina pectoris: Secondary | ICD-10-CM | POA: Diagnosis not present

## 2012-08-22 DIAGNOSIS — E1149 Type 2 diabetes mellitus with other diabetic neurological complication: Secondary | ICD-10-CM | POA: Diagnosis not present

## 2012-08-22 DIAGNOSIS — S88119A Complete traumatic amputation at level between knee and ankle, unspecified lower leg, initial encounter: Secondary | ICD-10-CM | POA: Diagnosis not present

## 2012-08-22 DIAGNOSIS — E1159 Type 2 diabetes mellitus with other circulatory complications: Secondary | ICD-10-CM | POA: Diagnosis not present

## 2012-08-22 DIAGNOSIS — G909 Disorder of the autonomic nervous system, unspecified: Secondary | ICD-10-CM | POA: Diagnosis not present

## 2012-08-25 DIAGNOSIS — G909 Disorder of the autonomic nervous system, unspecified: Secondary | ICD-10-CM | POA: Diagnosis not present

## 2012-08-25 DIAGNOSIS — E1159 Type 2 diabetes mellitus with other circulatory complications: Secondary | ICD-10-CM | POA: Diagnosis not present

## 2012-08-25 DIAGNOSIS — I96 Gangrene, not elsewhere classified: Secondary | ICD-10-CM | POA: Diagnosis not present

## 2012-08-25 DIAGNOSIS — E1149 Type 2 diabetes mellitus with other diabetic neurological complication: Secondary | ICD-10-CM | POA: Diagnosis not present

## 2012-08-25 DIAGNOSIS — I129 Hypertensive chronic kidney disease with stage 1 through stage 4 chronic kidney disease, or unspecified chronic kidney disease: Secondary | ICD-10-CM | POA: Diagnosis not present

## 2012-08-25 DIAGNOSIS — S88119A Complete traumatic amputation at level between knee and ankle, unspecified lower leg, initial encounter: Secondary | ICD-10-CM | POA: Diagnosis not present

## 2012-08-28 DIAGNOSIS — E1159 Type 2 diabetes mellitus with other circulatory complications: Secondary | ICD-10-CM | POA: Diagnosis not present

## 2012-08-28 DIAGNOSIS — S88119A Complete traumatic amputation at level between knee and ankle, unspecified lower leg, initial encounter: Secondary | ICD-10-CM | POA: Diagnosis not present

## 2012-08-28 DIAGNOSIS — G909 Disorder of the autonomic nervous system, unspecified: Secondary | ICD-10-CM | POA: Diagnosis not present

## 2012-08-28 DIAGNOSIS — I129 Hypertensive chronic kidney disease with stage 1 through stage 4 chronic kidney disease, or unspecified chronic kidney disease: Secondary | ICD-10-CM | POA: Diagnosis not present

## 2012-08-28 DIAGNOSIS — I96 Gangrene, not elsewhere classified: Secondary | ICD-10-CM | POA: Diagnosis not present

## 2012-08-28 DIAGNOSIS — E1149 Type 2 diabetes mellitus with other diabetic neurological complication: Secondary | ICD-10-CM | POA: Diagnosis not present

## 2012-08-28 NOTE — Progress Notes (Signed)
Late Entry--Missed G code   06/14/12 1209  PT Time Calculation  PT Start Time 1132  PT Stop Time 1207  PT Time Calculation (min) 35 min  PT G-Codes **NOT FOR INPATIENT CLASS**  Functional Assessment Tool Used clinical judgement  Functional Limitation Mobility: Walking and moving around  Mobility: Walking and Moving Around Goal Status [phone]) CI  Mobility: Walking and Moving Around Discharge Status [phone]) CJ  PT General Charges  $$ ACUTE PT VISIT 1 Procedure  PT Treatments  $Gait Training 8-22 mins  $Therapeutic Activity 8-22 mins  08/28/2012  Royalton Bing, PT [phone]  (pager)

## 2012-08-28 NOTE — Progress Notes (Signed)
Occupational Therapy Note Addendum  06/14/12 1400  OT Time Calculation  OT Start Time 1334  OT Stop Time 1352  OT Time Calculation (min) 18 min  OT G-codes **NOT FOR INPATIENT CLASS**  Functional Limitation Self care  Self Care Goal Status ([id number]) CI  Self Care Discharge Status ([id number]) CJ  OT General Charges  $OT Visit 1 Procedure  OT Treatments  $Therapeutic Activity 8-22 mins  [employer], OTR/L [phone]

## 2012-08-29 DIAGNOSIS — K5900 Constipation, unspecified: Secondary | ICD-10-CM | POA: Insufficient documentation

## 2012-08-29 DIAGNOSIS — E1149 Type 2 diabetes mellitus with other diabetic neurological complication: Secondary | ICD-10-CM | POA: Diagnosis not present

## 2012-08-29 DIAGNOSIS — I96 Gangrene, not elsewhere classified: Secondary | ICD-10-CM | POA: Diagnosis not present

## 2012-08-29 DIAGNOSIS — S88119A Complete traumatic amputation at level between knee and ankle, unspecified lower leg, initial encounter: Secondary | ICD-10-CM | POA: Diagnosis not present

## 2012-08-29 DIAGNOSIS — G909 Disorder of the autonomic nervous system, unspecified: Secondary | ICD-10-CM | POA: Diagnosis not present

## 2012-08-29 DIAGNOSIS — I129 Hypertensive chronic kidney disease with stage 1 through stage 4 chronic kidney disease, or unspecified chronic kidney disease: Secondary | ICD-10-CM | POA: Diagnosis not present

## 2012-08-29 DIAGNOSIS — E1159 Type 2 diabetes mellitus with other circulatory complications: Secondary | ICD-10-CM | POA: Diagnosis not present

## 2012-08-29 NOTE — Progress Notes (Signed)
Patient ID: Aaron Long, male   DOB: 1940/02/06, 72 y.o.   MRN: [id number]        PROGRESS NOTE  DATE: 07/31/2012   FACILITY: [facility]  LEVEL OF CARE: SNF (31)  Discharge Visit  CHIEF COMPLAINT:  Manage peripheral arterial disease and coronary artery disease.    HISTORY OF PRESENT ILLNESS: I was requested by the social worker to perform face-to-face evaluation for discharge:  Patient was admitted to this facility for short-term rehabilitation after the patient's recent hospitalization.  Patient has completed SNF rehabilitation and therapy has cleared the patient for discharge.  Reassessment of ongoing problem(s):  PERIPHERAL ARTERIAL DISEASE:  The patient had right foot cellulitis and he is status post amputation of right first toe with resection of metatarsal head secondary to gangrene.     Currently, the right foot is dressed.  He denies any pain.    CAD: The angina has been stable. The patient denies dyspnea on exertion, orthopnea, pedal edema, palpitations and paroxysmal nocturnal dyspnea. No complications noted from the medication presently being used.    PAST MEDICAL HISTORY : Reviewed.  No changes.  CURRENT MEDICATIONS: Reviewed per [facility]  REVIEW OF SYSTEMS:  GENERAL: no change in appetite, no fatigue, no weight changes, no fever, chills or weakness RESPIRATORY: no cough, SOB, DOE, wheezing, hemoptysis CARDIAC: no chest pain, edema or palpitations GI: no abdominal pain, diarrhea, constipation, heart burn, nausea or vomiting  PHYSICAL EXAMINATION  VS:  T       P       RR      BP 134/72     POX %       WT (Lb)  GENERAL: no acute distress, normal body habitus NECK: supple, trachea midline, no neck masses, no thyroid tenderness, no thyromegaly RESPIRATORY: breathing is even & unlabored, BS CTAB CARDIAC: RRR, no murmur,no extra heart sounds, no edema GI: abdomen soft, normal BS, no masses, no tenderness, no hepatomegaly, no splenomegaly PSYCHIATRIC:  the patient is alert & oriented to person, affect & behavior appropriate MUSCULOSKELETAL:   EXTREMITIES:   RIGHT LOWER EXTREMITY: right foot is dressed  LABS/RADIOLOGY: 07/2012:  Glucose 125.   BUN 27, creatinine 2.39, otherwise BMP normal.    HDL 24, otherwise lipid panel normal.    06/2012:  Hemoglobin 8.6, MCV 90.2, platelets 403, WBC 8.1.    Hemoglobin A1c 8.1.    ASSESSMENT/PLAN:  Peripheral arterial disease.  Status post right great toe amputation.  Continue wound care per home health nursing.    Coronary artery disease.  Stable.    Right foot cellulitis.  Patient is currently on Levaquin.    Constipation.  MiraLAX was increased.    Renovascular hypertension.  Norvasc was increased.    Diabetes mellitus with renal complications.  Follow up with primary MD.    Chronic kidney disease IV.  Renal functions improved.    I have filled out patient's discharge paperwork and written prescriptions.  Patient will receive home health PT and nursing.  Patient will be discharged on 08/01/2012.   DME provided:  None given.     Total discharge time: Less than 30 minutes Discharge time involved coordination of the discharge process with Child psychotherapist, nursing staff and therapy department. Medical justification for home health services/DME verified.  CPT CODE: 95621

## 2012-08-31 DIAGNOSIS — I129 Hypertensive chronic kidney disease with stage 1 through stage 4 chronic kidney disease, or unspecified chronic kidney disease: Secondary | ICD-10-CM | POA: Diagnosis not present

## 2012-08-31 DIAGNOSIS — S88119A Complete traumatic amputation at level between knee and ankle, unspecified lower leg, initial encounter: Secondary | ICD-10-CM | POA: Diagnosis not present

## 2012-08-31 DIAGNOSIS — E1149 Type 2 diabetes mellitus with other diabetic neurological complication: Secondary | ICD-10-CM | POA: Diagnosis not present

## 2012-08-31 DIAGNOSIS — E1159 Type 2 diabetes mellitus with other circulatory complications: Secondary | ICD-10-CM | POA: Diagnosis not present

## 2012-08-31 DIAGNOSIS — I96 Gangrene, not elsewhere classified: Secondary | ICD-10-CM | POA: Diagnosis not present

## 2012-08-31 DIAGNOSIS — G909 Disorder of the autonomic nervous system, unspecified: Secondary | ICD-10-CM | POA: Diagnosis not present

## 2012-09-01 DIAGNOSIS — E1149 Type 2 diabetes mellitus with other diabetic neurological complication: Secondary | ICD-10-CM | POA: Diagnosis not present

## 2012-09-01 DIAGNOSIS — I129 Hypertensive chronic kidney disease with stage 1 through stage 4 chronic kidney disease, or unspecified chronic kidney disease: Secondary | ICD-10-CM | POA: Diagnosis not present

## 2012-09-01 DIAGNOSIS — I96 Gangrene, not elsewhere classified: Secondary | ICD-10-CM | POA: Diagnosis not present

## 2012-09-01 DIAGNOSIS — S88119A Complete traumatic amputation at level between knee and ankle, unspecified lower leg, initial encounter: Secondary | ICD-10-CM | POA: Diagnosis not present

## 2012-09-01 DIAGNOSIS — E1159 Type 2 diabetes mellitus with other circulatory complications: Secondary | ICD-10-CM | POA: Diagnosis not present

## 2012-09-01 DIAGNOSIS — G909 Disorder of the autonomic nervous system, unspecified: Secondary | ICD-10-CM | POA: Diagnosis not present

## 2012-09-05 DIAGNOSIS — E1159 Type 2 diabetes mellitus with other circulatory complications: Secondary | ICD-10-CM | POA: Diagnosis not present

## 2012-09-05 DIAGNOSIS — E1149 Type 2 diabetes mellitus with other diabetic neurological complication: Secondary | ICD-10-CM | POA: Diagnosis not present

## 2012-09-05 DIAGNOSIS — I129 Hypertensive chronic kidney disease with stage 1 through stage 4 chronic kidney disease, or unspecified chronic kidney disease: Secondary | ICD-10-CM | POA: Diagnosis not present

## 2012-09-05 DIAGNOSIS — G909 Disorder of the autonomic nervous system, unspecified: Secondary | ICD-10-CM | POA: Diagnosis not present

## 2012-09-05 DIAGNOSIS — I96 Gangrene, not elsewhere classified: Secondary | ICD-10-CM | POA: Diagnosis not present

## 2012-09-05 DIAGNOSIS — S88119A Complete traumatic amputation at level between knee and ankle, unspecified lower leg, initial encounter: Secondary | ICD-10-CM | POA: Diagnosis not present

## 2012-09-06 ENCOUNTER — Ambulatory Visit (INDEPENDENT_AMBULATORY_CARE_PROVIDER_SITE_OTHER): Payer: Medicare Other | Admitting: Internal Medicine

## 2012-09-06 ENCOUNTER — Encounter: Payer: Self-pay | Admitting: Vascular Surgery

## 2012-09-06 ENCOUNTER — Encounter: Payer: Self-pay | Admitting: Internal Medicine

## 2012-09-06 VITALS — BP 144/86 | HR 82 | Temp 99.5°F | Wt 185.0 lb

## 2012-09-06 DIAGNOSIS — S88119A Complete traumatic amputation at level between knee and ankle, unspecified lower leg, initial encounter: Secondary | ICD-10-CM | POA: Diagnosis not present

## 2012-09-06 DIAGNOSIS — D649 Anemia, unspecified: Secondary | ICD-10-CM | POA: Diagnosis not present

## 2012-09-06 DIAGNOSIS — I10 Essential (primary) hypertension: Secondary | ICD-10-CM | POA: Diagnosis not present

## 2012-09-06 DIAGNOSIS — E785 Hyperlipidemia, unspecified: Secondary | ICD-10-CM

## 2012-09-06 DIAGNOSIS — K219 Gastro-esophageal reflux disease without esophagitis: Secondary | ICD-10-CM | POA: Insufficient documentation

## 2012-09-06 DIAGNOSIS — I96 Gangrene, not elsewhere classified: Secondary | ICD-10-CM | POA: Diagnosis not present

## 2012-09-06 DIAGNOSIS — E1151 Type 2 diabetes mellitus with diabetic peripheral angiopathy without gangrene: Secondary | ICD-10-CM

## 2012-09-06 DIAGNOSIS — E1159 Type 2 diabetes mellitus with other circulatory complications: Secondary | ICD-10-CM

## 2012-09-06 DIAGNOSIS — I798 Other disorders of arteries, arterioles and capillaries in diseases classified elsewhere: Secondary | ICD-10-CM | POA: Diagnosis not present

## 2012-09-06 DIAGNOSIS — I739 Peripheral vascular disease, unspecified: Secondary | ICD-10-CM

## 2012-09-06 DIAGNOSIS — G909 Disorder of the autonomic nervous system, unspecified: Secondary | ICD-10-CM | POA: Diagnosis not present

## 2012-09-06 DIAGNOSIS — I129 Hypertensive chronic kidney disease with stage 1 through stage 4 chronic kidney disease, or unspecified chronic kidney disease: Secondary | ICD-10-CM | POA: Diagnosis not present

## 2012-09-06 DIAGNOSIS — E1149 Type 2 diabetes mellitus with other diabetic neurological complication: Secondary | ICD-10-CM | POA: Diagnosis not present

## 2012-09-06 DIAGNOSIS — Z89519 Acquired absence of unspecified leg below knee: Secondary | ICD-10-CM

## 2012-09-06 MED ORDER — INSULIN ASPART 100 UNIT/ML FLEXPEN: PEN_INJECTOR | SUBCUTANEOUS | Status: DC

## 2012-09-06 MED ORDER — AMLODIPINE BESYLATE 5 MG PO TABS: 5.0000 mg | ORAL_TABLET | Freq: Every day | ORAL | Status: DC

## 2012-09-06 MED ORDER — DOXAZOSIN MESYLATE 1 MG PO TABS: 1.0000 mg | ORAL_TABLET | Freq: Every day | ORAL | Status: DC

## 2012-09-06 MED ORDER — PANTOPRAZOLE SODIUM 40 MG PO TBEC: 40.0000 mg | DELAYED_RELEASE_TABLET | Freq: Every day | ORAL | Status: DC

## 2012-09-06 MED ORDER — HYDROCODONE-ACETAMINOPHEN 5-325 MG PO TABS: 1.0000 | ORAL_TABLET | ORAL | Status: DC | PRN

## 2012-09-06 MED ORDER — METOPROLOL SUCCINATE 12.5 MG HALF TABLET: 12.5000 mg | ORAL_TABLET | Freq: Every day | ORAL | Status: DC

## 2012-09-06 MED ORDER — INSULIN DETEMIR 100 UNIT/ML FLEXPEN: 18.0000 [IU] | PEN_INJECTOR | Freq: Every day | SUBCUTANEOUS | Status: DC

## 2012-09-06 MED ORDER — CLOPIDOGREL BISULFATE 75 MG PO TABS: 75.0000 mg | ORAL_TABLET | Freq: Every day | ORAL | Status: DC

## 2012-09-06 MED ORDER — PREGABALIN 75 MG PO CAPS: 75.0000 mg | ORAL_CAPSULE | Freq: Two times a day (BID) | ORAL | Status: DC

## 2012-09-06 MED ORDER — ATORVASTATIN CALCIUM 10 MG PO TABS: 10.0000 mg | ORAL_TABLET | Freq: Every day | ORAL | Status: DC

## 2012-09-06 NOTE — Progress Notes (Signed)
Patient ID: Aaron Long, male   DOB: 1940-12-29, 72 y.o.   MRN: [id number]  CC- hospital follow up  HPI- 72 y/o male patient is here today for hospitalization follow up. He was admitted and underwent right BKA 08/08/12. He also has hx of left BKA. He was put on lovenox for dvt prophylaxis and pain medication. He had acute blood loss anemia post op and did not receive any blood transfusion. He then underwent inpatient rehab and was sent to SNF for STR. He is now home and has home PT/OT services 3 days a week. He was seen with his wife present. He mentions the pain to be under control.  cbg this am 113 cbg yesterday am was 220 and 130 in the evening Is taking levemir 18 u daily and novolog 5 u for cbg > 150. Would like this to be changed from vial to flexpen Compliant with his medications  Review of Systems  Constitutional: Negative for diaphoresis.  HENT: Negative for congestion and sore throat.   Eyes: Negative for blurred vision and double vision.  Respiratory: Negative for cough, shortness of breath and wheezing.   Cardiovascular: Negative for chest pain and leg swelling.  Gastrointestinal: Negative for heartburn, nausea, vomiting, abdominal pain, diarrhea and constipation.  Genitourinary: Negative for dysuria.  Musculoskeletal: Negative for myalgias and falls.  Skin: Negative for rash.  Neurological: Positive for weakness. Negative for dizziness and headaches.  Psychiatric/Behavioral: Negative for depression and memory loss. The patient is not nervous/anxious.    Allergies  Allergen Reactions  . Oxycodone Other (See Comments)    Hallucinations    Past Medical History  Diagnosis Date  . Prostate cancer 2010  . PAD (peripheral artery disease)   . Gangrene of toe     dry  . Neuromuscular disorder     diabetic neuropathy  . Hypertension     takes Amlodipine daily and Metoprolol bid  . Hyperlipidemia     takes Pravastatin daily  . Myocardial infarction 2005  . Peripheral  neuropathy   . Constipation     takes Miralax daily  . Hemorrhoids   . Hx of colonic polyps   . History of kidney stones   . Diabetes mellitus     Novolog and Levemir daily  . Coronary artery disease   . BPH (benign prostatic hypertrophy)   . Abnormality of gait   . Coronary atherosclerosis of native coronary artery   . Phlebitis and thrombophlebitis of other deep vessels of lower extremities   . Chronic kidney disease   . Hypertrophy of prostate without urinary obstruction and other lower urinary tract symptoms (LUTS)   . Disorder of bone and cartilage, unspecified   . Lower limb amputation, below knee   . Long term (current) use of anticoagulants   . Anemia 06/19/2012    Past Surgical History  Procedure Laterality Date  . Prostate surgery  2009    prostatectomy  . Knee cartilage surgery  at age 26    left knee  . Cea      Right  . Coronary artery bypass graft  2005    x 4  . Carotid endarterectomy  2005    right  . Cataract surgery  2011    bilateral  . Colonoscopy    . Cardiac catheterization  05/28/11  . Amputation  06/14/2011    Procedure: AMPUTATION DIGIT;  Surgeon: Sherren Kerns, MD;  Location: [facility] OR;  Service: Vascular;  Laterality: Left;  Amputation Left fifth  toe  . Amputation  06/16/2011    Procedure: AMPUTATION BELOW KNEE;  Surgeon: Sherren Kerns, MD;  Location: [facility] OR;  Service: Vascular;  Laterality: Left;  . I&d extremity  01/31/2012    Procedure: IRRIGATION AND DEBRIDEMENT EXTREMITY;  Surgeon: Sherren Kerns, MD;  Location: [facility] OR;  Service: Vascular;  Laterality: Left;  I & D Left BKA   . Amputation Right 06/12/2012    Procedure: AMPUTATION DIGIT;  Surgeon: Sherren Kerns, MD;  Location: [facility] OR;  Service: Vascular;  Laterality: Right;  GREAT TOE  . Below knee leg amputation Right 08/08/2012    Dr Darrick Penna  . Amputation Right 08/08/2012    Procedure: AMPUTATION BELOW KNEE;  Surgeon: Sherren Kerns, MD;  Location: [facility] OR;  Service: Vascular;  Laterality:  Right;   Current Outpatient Prescriptions on File Prior to Visit  Medication Sig Dispense Refill  . aspirin EC 81 MG tablet Take 1 tablet (81 mg total) by mouth every morning.      . insulin detemir (LEVEMIR) 100 UNIT/ML injection Inject 0.18 mLs (18 Units total) into the skin daily.  10 mL  12   No current facility-administered medications on file prior to visit.    Physical exam  BP 144/86  Pulse 82  Temp(Src) 99.5 F (37.5 C) (Oral)  Wt 185 lb (83.915 kg)  BMI 28.14 kg/m2  SpO2 97%  gen- elderly male in NAD HEETN- no pallor, no citerus, no LAD, MMM cvs- ns 1,s2, rrr respi- CTAB abdo- bs+, soft, non tender Ext- b/l BKA, has prosthetic limb on left Neuro- aao x 3, no focal deficit Psych- mood and affect normal  Labs- CBC    Component Value Date/Time   WBC 7.6 08/16/2012 0630   RBC 2.93* 08/16/2012 0630   HGB 8.3* 08/16/2012 0630   HCT 25.8* 08/16/2012 0630   PLT 347 08/16/2012 0630   MCV 88.1 08/16/2012 0630   MCH 28.3 08/16/2012 0630   MCHC 32.2 08/16/2012 0630   RDW 16.3* 08/16/2012 0630   LYMPHSABS 1.7 08/14/2012 0605   MONOABS 0.7 08/14/2012 0605   EOSABS 0.4 08/14/2012 0605   BASOSABS 0.0 08/14/2012 0605    CMP     Component Value Date/Time   NA 137 08/14/2012 0605   NA 143 05/23/2012 1250   K 4.7 08/14/2012 0605   CL 101 08/14/2012 0605   CO2 26 08/14/2012 0605   GLUCOSE 137* 08/14/2012 0605   GLUCOSE 84 05/23/2012 1250   BUN 35* 08/14/2012 0605   BUN 39* 05/23/2012 1250   CREATININE 2.60* 08/18/2012 0600   CALCIUM 9.2 08/14/2012 0605   PROT 6.5 08/14/2012 0605   ALBUMIN 2.5* 08/14/2012 0605   AST 14 08/14/2012 0605   ALT 8 08/14/2012 0605   ALKPHOS 95 08/14/2012 0605   BILITOT 0.2* 08/14/2012 0605   GFRNONAA 23* 08/18/2012 0600   GFRAA 27* 08/18/2012 0600    Assessment/plan  Dm type 2 with PVD- will recheck a1c. Continue cbg monitor. Changed his medication to pen from vial. No dosage chnaged. Will review a1c and adjust further. Pt not to skip meals and doses of his insulin. Voices understanding  this.  HTN- bp well controlled. continue current regimen- goal less than 130/80. cotninue amlodipine and toprol xl with asa and statin  PVD- s/p recent right BKA. To follow with Dr Darrick Penna tomorrow. Pain under control with current regimen. Regular bowel movement. Will have staples removed tomorrow. Continue home PT/OT. Continue asa and plavix. Will continue lyrica for  now  Gait instability- post b/l BKA, will need to work with PT/OT for muscle strengthening. Upper extremity strength is adequate. Fall precautions.  Anemia- low hb/hct on d/c from hospital. Recheck cbc  Hyperlipidemia- continue lipitor for now  BPH- continue doxazosin for now  GERD- continue protonix for now, monitor his symptoms

## 2012-09-07 ENCOUNTER — Encounter: Payer: Self-pay | Admitting: Vascular Surgery

## 2012-09-07 ENCOUNTER — Other Ambulatory Visit: Payer: Self-pay | Admitting: Internal Medicine

## 2012-09-07 ENCOUNTER — Ambulatory Visit (INDEPENDENT_AMBULATORY_CARE_PROVIDER_SITE_OTHER): Payer: Medicare Other | Admitting: Vascular Surgery

## 2012-09-07 VITALS — BP 149/65 | HR 78 | Temp 98.1°F | Ht 68.0 in | Wt 185.0 lb

## 2012-09-07 DIAGNOSIS — N289 Disorder of kidney and ureter, unspecified: Secondary | ICD-10-CM

## 2012-09-07 DIAGNOSIS — I96 Gangrene, not elsewhere classified: Secondary | ICD-10-CM | POA: Diagnosis not present

## 2012-09-07 DIAGNOSIS — S88119A Complete traumatic amputation at level between knee and ankle, unspecified lower leg, initial encounter: Secondary | ICD-10-CM | POA: Diagnosis not present

## 2012-09-07 DIAGNOSIS — I129 Hypertensive chronic kidney disease with stage 1 through stage 4 chronic kidney disease, or unspecified chronic kidney disease: Secondary | ICD-10-CM | POA: Diagnosis not present

## 2012-09-07 DIAGNOSIS — E1149 Type 2 diabetes mellitus with other diabetic neurological complication: Secondary | ICD-10-CM | POA: Diagnosis not present

## 2012-09-07 DIAGNOSIS — E1159 Type 2 diabetes mellitus with other circulatory complications: Secondary | ICD-10-CM | POA: Diagnosis not present

## 2012-09-07 DIAGNOSIS — I739 Peripheral vascular disease, unspecified: Secondary | ICD-10-CM

## 2012-09-07 DIAGNOSIS — G909 Disorder of the autonomic nervous system, unspecified: Secondary | ICD-10-CM | POA: Diagnosis not present

## 2012-09-07 LAB — COMPREHENSIVE METABOLIC PANEL
Albumin: 3.6 g/dL (ref 3.5–4.8)
BUN: 41 mg/dL — ABNORMAL HIGH (ref 8–27)
CO2: 23 mmol/L (ref 18–29)
Calcium: 9.4 mg/dL (ref 8.6–10.2)
Creatinine, Ser: 2.66 mg/dL — ABNORMAL HIGH (ref 0.76–1.27)
Globulin, Total: 2.8 g/dL (ref 1.5–4.5)
Glucose: 168 mg/dL — ABNORMAL HIGH (ref 65–99)

## 2012-09-07 LAB — CBC WITH DIFFERENTIAL/PLATELET
Basos: 1 % (ref 0–3)
Eos: 5 % (ref 0–5)
Eosinophils Absolute: 0.3 10*3/uL (ref 0.0–0.4)
Hemoglobin: 10.1 g/dL — ABNORMAL LOW (ref 12.6–17.7)
Lymphs: 35 % (ref 14–46)
MCH: 28.7 pg (ref 26.6–33.0)
MCV: 93 fL (ref 79–97)
Monocytes Absolute: 0.4 10*3/uL (ref 0.1–0.9)
Neutrophils Absolute: 3.5 10*3/uL (ref 1.4–7.0)
RBC: 3.52 x10E6/uL — ABNORMAL LOW (ref 4.14–5.80)
WBC: 6.6 10*3/uL (ref 3.4–10.8)

## 2012-09-07 LAB — HEMOGLOBIN A1C: Hgb A1c MFr Bld: 7.2 % — ABNORMAL HIGH (ref 4.8–5.6)

## 2012-09-07 NOTE — Progress Notes (Signed)
Patient is a 72 year old male who underwent right below-knee amputation on July 29. He returns today for followup. He reports no significant pain in the below-knee amputation. He is continuing home physical therapy.  Physical exam:  Filed Vitals:   09/07/12 0905  BP: 149/65  Pulse: 78  Temp: 98.1 F (36.7 C)  TempSrc: Oral  Height: 5\' 8"  (1.727 m)  Weight: 185 lb (83.915 kg)  SpO2: 100%   Right below-knee amputation: Healing incision minimal edema  Assessment: Healing right below-knee amputation  Plan: Patient was given a form today for permanent handicapped sticker.  He was also given a prescription for a new shrinker for the right leg. He will followup as needed.  Fabienne Bruns, MD [facility] Office: [phone] Pager: [phone]

## 2012-09-12 ENCOUNTER — Other Ambulatory Visit: Payer: Medicare Other

## 2012-09-12 DIAGNOSIS — G909 Disorder of the autonomic nervous system, unspecified: Secondary | ICD-10-CM | POA: Diagnosis not present

## 2012-09-12 DIAGNOSIS — E1149 Type 2 diabetes mellitus with other diabetic neurological complication: Secondary | ICD-10-CM | POA: Diagnosis not present

## 2012-09-12 DIAGNOSIS — S88119A Complete traumatic amputation at level between knee and ankle, unspecified lower leg, initial encounter: Secondary | ICD-10-CM | POA: Diagnosis not present

## 2012-09-12 DIAGNOSIS — I129 Hypertensive chronic kidney disease with stage 1 through stage 4 chronic kidney disease, or unspecified chronic kidney disease: Secondary | ICD-10-CM | POA: Diagnosis not present

## 2012-09-12 DIAGNOSIS — E1159 Type 2 diabetes mellitus with other circulatory complications: Secondary | ICD-10-CM | POA: Diagnosis not present

## 2012-09-12 DIAGNOSIS — I96 Gangrene, not elsewhere classified: Secondary | ICD-10-CM | POA: Diagnosis not present

## 2012-09-14 DIAGNOSIS — G909 Disorder of the autonomic nervous system, unspecified: Secondary | ICD-10-CM | POA: Diagnosis not present

## 2012-09-14 DIAGNOSIS — E1149 Type 2 diabetes mellitus with other diabetic neurological complication: Secondary | ICD-10-CM | POA: Diagnosis not present

## 2012-09-14 DIAGNOSIS — I129 Hypertensive chronic kidney disease with stage 1 through stage 4 chronic kidney disease, or unspecified chronic kidney disease: Secondary | ICD-10-CM | POA: Diagnosis not present

## 2012-09-14 DIAGNOSIS — E1159 Type 2 diabetes mellitus with other circulatory complications: Secondary | ICD-10-CM | POA: Diagnosis not present

## 2012-09-14 DIAGNOSIS — S88119A Complete traumatic amputation at level between knee and ankle, unspecified lower leg, initial encounter: Secondary | ICD-10-CM | POA: Diagnosis not present

## 2012-09-14 DIAGNOSIS — I96 Gangrene, not elsewhere classified: Secondary | ICD-10-CM | POA: Diagnosis not present

## 2012-09-15 ENCOUNTER — Inpatient Hospital Stay: Payer: BLUE CROSS/BLUE SHIELD | Admitting: Physical Medicine & Rehabilitation

## 2012-09-15 DIAGNOSIS — S88119A Complete traumatic amputation at level between knee and ankle, unspecified lower leg, initial encounter: Secondary | ICD-10-CM | POA: Diagnosis not present

## 2012-09-15 DIAGNOSIS — E1149 Type 2 diabetes mellitus with other diabetic neurological complication: Secondary | ICD-10-CM | POA: Diagnosis not present

## 2012-09-15 DIAGNOSIS — E1159 Type 2 diabetes mellitus with other circulatory complications: Secondary | ICD-10-CM | POA: Diagnosis not present

## 2012-09-15 DIAGNOSIS — I96 Gangrene, not elsewhere classified: Secondary | ICD-10-CM | POA: Diagnosis not present

## 2012-09-15 DIAGNOSIS — I129 Hypertensive chronic kidney disease with stage 1 through stage 4 chronic kidney disease, or unspecified chronic kidney disease: Secondary | ICD-10-CM | POA: Diagnosis not present

## 2012-09-15 DIAGNOSIS — G909 Disorder of the autonomic nervous system, unspecified: Secondary | ICD-10-CM | POA: Diagnosis not present

## 2012-09-19 DIAGNOSIS — E1149 Type 2 diabetes mellitus with other diabetic neurological complication: Secondary | ICD-10-CM | POA: Diagnosis not present

## 2012-09-19 DIAGNOSIS — E1159 Type 2 diabetes mellitus with other circulatory complications: Secondary | ICD-10-CM | POA: Diagnosis not present

## 2012-09-19 DIAGNOSIS — I129 Hypertensive chronic kidney disease with stage 1 through stage 4 chronic kidney disease, or unspecified chronic kidney disease: Secondary | ICD-10-CM | POA: Diagnosis not present

## 2012-09-19 DIAGNOSIS — S88119A Complete traumatic amputation at level between knee and ankle, unspecified lower leg, initial encounter: Secondary | ICD-10-CM | POA: Diagnosis not present

## 2012-09-19 DIAGNOSIS — G909 Disorder of the autonomic nervous system, unspecified: Secondary | ICD-10-CM | POA: Diagnosis not present

## 2012-09-19 DIAGNOSIS — I96 Gangrene, not elsewhere classified: Secondary | ICD-10-CM | POA: Diagnosis not present

## 2012-09-20 DIAGNOSIS — I129 Hypertensive chronic kidney disease with stage 1 through stage 4 chronic kidney disease, or unspecified chronic kidney disease: Secondary | ICD-10-CM | POA: Diagnosis not present

## 2012-09-20 DIAGNOSIS — E1149 Type 2 diabetes mellitus with other diabetic neurological complication: Secondary | ICD-10-CM | POA: Diagnosis not present

## 2012-09-20 DIAGNOSIS — S88119A Complete traumatic amputation at level between knee and ankle, unspecified lower leg, initial encounter: Secondary | ICD-10-CM | POA: Diagnosis not present

## 2012-09-20 DIAGNOSIS — E1159 Type 2 diabetes mellitus with other circulatory complications: Secondary | ICD-10-CM | POA: Diagnosis not present

## 2012-09-20 DIAGNOSIS — I96 Gangrene, not elsewhere classified: Secondary | ICD-10-CM | POA: Diagnosis not present

## 2012-09-20 DIAGNOSIS — G909 Disorder of the autonomic nervous system, unspecified: Secondary | ICD-10-CM | POA: Diagnosis not present

## 2012-09-21 ENCOUNTER — Other Ambulatory Visit: Payer: Self-pay | Admitting: *Deleted

## 2012-09-21 DIAGNOSIS — G909 Disorder of the autonomic nervous system, unspecified: Secondary | ICD-10-CM | POA: Diagnosis not present

## 2012-09-21 DIAGNOSIS — S88119A Complete traumatic amputation at level between knee and ankle, unspecified lower leg, initial encounter: Secondary | ICD-10-CM | POA: Diagnosis not present

## 2012-09-21 DIAGNOSIS — E1149 Type 2 diabetes mellitus with other diabetic neurological complication: Secondary | ICD-10-CM | POA: Diagnosis not present

## 2012-09-21 DIAGNOSIS — E1159 Type 2 diabetes mellitus with other circulatory complications: Secondary | ICD-10-CM | POA: Diagnosis not present

## 2012-09-21 DIAGNOSIS — I96 Gangrene, not elsewhere classified: Secondary | ICD-10-CM | POA: Diagnosis not present

## 2012-09-21 DIAGNOSIS — I129 Hypertensive chronic kidney disease with stage 1 through stage 4 chronic kidney disease, or unspecified chronic kidney disease: Secondary | ICD-10-CM | POA: Diagnosis not present

## 2012-09-25 ENCOUNTER — Telehealth: Payer: Self-pay

## 2012-09-25 DIAGNOSIS — S88119A Complete traumatic amputation at level between knee and ankle, unspecified lower leg, initial encounter: Secondary | ICD-10-CM | POA: Diagnosis not present

## 2012-09-25 DIAGNOSIS — I129 Hypertensive chronic kidney disease with stage 1 through stage 4 chronic kidney disease, or unspecified chronic kidney disease: Secondary | ICD-10-CM | POA: Diagnosis not present

## 2012-09-25 DIAGNOSIS — E1149 Type 2 diabetes mellitus with other diabetic neurological complication: Secondary | ICD-10-CM | POA: Diagnosis not present

## 2012-09-25 DIAGNOSIS — E1159 Type 2 diabetes mellitus with other circulatory complications: Secondary | ICD-10-CM | POA: Diagnosis not present

## 2012-09-25 DIAGNOSIS — G909 Disorder of the autonomic nervous system, unspecified: Secondary | ICD-10-CM | POA: Diagnosis not present

## 2012-09-25 DIAGNOSIS — I96 Gangrene, not elsewhere classified: Secondary | ICD-10-CM | POA: Diagnosis not present

## 2012-09-25 NOTE — Telephone Encounter (Signed)
Megan from [facility] called to see if we had received a order for transfer board for the patient? She would like a call at [phone] ext [phone] to let her know if the order was received or if she needed to send another one.

## 2012-09-26 DIAGNOSIS — E1149 Type 2 diabetes mellitus with other diabetic neurological complication: Secondary | ICD-10-CM | POA: Diagnosis not present

## 2012-09-26 DIAGNOSIS — E1159 Type 2 diabetes mellitus with other circulatory complications: Secondary | ICD-10-CM | POA: Diagnosis not present

## 2012-09-26 DIAGNOSIS — I96 Gangrene, not elsewhere classified: Secondary | ICD-10-CM | POA: Diagnosis not present

## 2012-09-26 DIAGNOSIS — G909 Disorder of the autonomic nervous system, unspecified: Secondary | ICD-10-CM | POA: Diagnosis not present

## 2012-09-26 DIAGNOSIS — I129 Hypertensive chronic kidney disease with stage 1 through stage 4 chronic kidney disease, or unspecified chronic kidney disease: Secondary | ICD-10-CM | POA: Diagnosis not present

## 2012-09-26 DIAGNOSIS — S88119A Complete traumatic amputation at level between knee and ankle, unspecified lower leg, initial encounter: Secondary | ICD-10-CM | POA: Diagnosis not present

## 2012-09-27 ENCOUNTER — Ambulatory Visit (INDEPENDENT_AMBULATORY_CARE_PROVIDER_SITE_OTHER): Payer: Medicare Other | Admitting: Internal Medicine

## 2012-09-27 ENCOUNTER — Encounter: Payer: Self-pay | Admitting: Internal Medicine

## 2012-09-27 VITALS — BP 170/80 | HR 73 | Temp 98.4°F | Resp 16 | Ht 68.0 in | Wt 185.0 lb

## 2012-09-27 DIAGNOSIS — S88119A Complete traumatic amputation at level between knee and ankle, unspecified lower leg, initial encounter: Secondary | ICD-10-CM | POA: Diagnosis not present

## 2012-09-27 DIAGNOSIS — Z23 Encounter for immunization: Secondary | ICD-10-CM

## 2012-09-27 DIAGNOSIS — I129 Hypertensive chronic kidney disease with stage 1 through stage 4 chronic kidney disease, or unspecified chronic kidney disease: Secondary | ICD-10-CM | POA: Diagnosis not present

## 2012-09-27 DIAGNOSIS — I96 Gangrene, not elsewhere classified: Secondary | ICD-10-CM | POA: Diagnosis not present

## 2012-09-27 DIAGNOSIS — N184 Chronic kidney disease, stage 4 (severe): Secondary | ICD-10-CM

## 2012-09-27 DIAGNOSIS — E1142 Type 2 diabetes mellitus with diabetic polyneuropathy: Secondary | ICD-10-CM

## 2012-09-27 DIAGNOSIS — E1149 Type 2 diabetes mellitus with other diabetic neurological complication: Secondary | ICD-10-CM

## 2012-09-27 DIAGNOSIS — E1140 Type 2 diabetes mellitus with diabetic neuropathy, unspecified: Secondary | ICD-10-CM

## 2012-09-27 DIAGNOSIS — I10 Essential (primary) hypertension: Secondary | ICD-10-CM

## 2012-09-27 DIAGNOSIS — G909 Disorder of the autonomic nervous system, unspecified: Secondary | ICD-10-CM | POA: Diagnosis not present

## 2012-09-27 DIAGNOSIS — K219 Gastro-esophageal reflux disease without esophagitis: Secondary | ICD-10-CM

## 2012-09-27 DIAGNOSIS — E1159 Type 2 diabetes mellitus with other circulatory complications: Secondary | ICD-10-CM | POA: Diagnosis not present

## 2012-09-27 MED ORDER — HYDROCODONE-ACETAMINOPHEN 5-325 MG PO TABS: 1.0000 | ORAL_TABLET | ORAL | Status: DC | PRN

## 2012-09-27 MED ORDER — HYDROCODONE-ACETAMINOPHEN 5-325 MG PO TABS
1.0000 | ORAL_TABLET | ORAL | Status: DC | PRN
Start: 2012-09-27 — End: 2012-09-27

## 2012-09-27 MED ORDER — DOXAZOSIN MESYLATE 1 MG PO TABS: 1.0000 mg | ORAL_TABLET | Freq: Every day | ORAL | Status: DC

## 2012-09-27 NOTE — Progress Notes (Signed)
Patient ID: Aaron Long, male   DOB: [birth year], 72 y.o.   MRN: [id number]  Chief Complaint  Patient presents with  . Medical Managment of Chronic Issues    Allergies  Allergen Reactions  . Oxycodone Other (See Comments)    Hallucinations   HPI 72 y/o male patient is here for routine follow up. His SBP is elevated in office today. He does not have his glucose reading or glucometer with him today. cbg this am was 107 and 131 after lunch. He mentions rushing to the clinic today and having a stressful morning. He has PT and RN services at home at present.   Review of Systems  Constitutional: Negative for diaphoresis.  HENT: Negative for congestion and sore throat.   Eyes: Negative for blurred vision and double vision.  Respiratory: Negative for cough, shortness of breath and wheezing.   Cardiovascular: Negative for chest pain and leg swelling.  Gastrointestinal: Negative for heartburn, nausea, vomiting, abdominal pain, diarrhea and constipation.  Genitourinary: Negative for dysuria.  Musculoskeletal: Negative for myalgias and falls.  Skin: Negative for rash.  Neurological: Negative for dizziness and headaches.  Psychiatric/Behavioral: Negative for depression and memory loss. The patient is not nervous/anxious.    Past Medical History  Diagnosis Date  . Prostate cancer 2010  . PAD (peripheral artery disease)   . Gangrene of toe     dry  . Neuromuscular disorder     diabetic neuropathy  . Hypertension     takes Amlodipine daily and Metoprolol bid  . Hyperlipidemia     takes Pravastatin daily  . Myocardial infarction 2005  . Peripheral neuropathy   . Constipation     takes Miralax daily  . Hemorrhoids   . Hx of colonic polyps   . History of kidney stones   . Diabetes mellitus     Novolog and Levemir daily  . Coronary artery disease   . BPH (benign prostatic hypertrophy)   . Abnormality of gait   . Coronary atherosclerosis of native coronary artery   . Phlebitis and  thrombophlebitis of other deep vessels of lower extremities   . Chronic kidney disease   . Hypertrophy of prostate without urinary obstruction and other lower urinary tract symptoms (LUTS)   . Disorder of bone and cartilage, unspecified   . Lower limb amputation, below knee   . Long term (current) use of anticoagulants   . Anemia 06/19/2012   Past Surgical History  Procedure Laterality Date  . Prostate surgery  2009    prostatectomy  . Knee cartilage surgery  at age 66    left knee  . Cea      Right  . Coronary artery bypass graft  2005    x 4  . Carotid endarterectomy  2005    right  . Cataract surgery  2011    bilateral  . Colonoscopy    . Cardiac catheterization  05/28/11  . Amputation  06/14/2011    Procedure: AMPUTATION DIGIT;  Surgeon: Sherren Kerns, MD;  Location: [facility] OR;  Service: Vascular;  Laterality: Left;  Amputation Left fifth toe  . Amputation  06/16/2011    Procedure: AMPUTATION BELOW KNEE;  Surgeon: Sherren Kerns, MD;  Location: [facility] OR;  Service: Vascular;  Laterality: Left;  . I&d extremity  01/31/2012    Procedure: IRRIGATION AND DEBRIDEMENT EXTREMITY;  Surgeon: Sherren Kerns, MD;  Location: [facility] OR;  Service: Vascular;  Laterality: Left;  I & D Left BKA   .  Amputation Right 06/12/2012    Procedure: AMPUTATION DIGIT;  Surgeon: Sherren Kerns, MD;  Location: [facility] OR;  Service: Vascular;  Laterality: Right;  GREAT TOE  . Below knee leg amputation Right 08/08/2012    Dr Darrick Penna  . Amputation Right 08/08/2012    Procedure: AMPUTATION BELOW KNEE;  Surgeon: Sherren Kerns, MD;  Location: [facility] OR;  Service: Vascular;  Laterality: Right;    Physical exam  BP 170/80  Pulse 73  Temp(Src) 98.4 F (36.9 C) (Oral)  Resp 16  Ht 5\' 8"  (1.727 m)  Wt 185 lb (83.915 kg)  BMI 28.14 kg/m2  SpO2 99%  gen- elderly male in NAD HEETN- no pallor, no citerus, no LAD, MMM cvs- normal s1,s2, rrr respi- CTAB, no wheeze, no rhonchi, no crackles abdo- bs+, soft, non tender Ext- b/l  BKA, uses a wheelchair, adequate upper extremity strength Neuro- aao x 3, no focal deficit Psych- mood and affect normal   Labs reviewed   Assessment/plan  Dm type 2 with PVD- reviewed a1c. Will continue current regimen of insulin. No changes. Log book provided and to review cbg in upcoming visit. Continue asprin and statin. Not on ACEI given worsening renal function. Influenza vaccine provided  HTN- SBP elevated today. continue amlodipine and toprol xl with asa and statin. bp to be checked by home health nurse, script for this provided  ckd stage 4- worsening renal function, likely from longstanding uncontrolled dm, hypertension and PVD. Has upcoming renal appointment  PVD- s/p recent right BKA. Pain under control with current regimen. Continue norco and lyrica. Continue home PT/OT. Continue asa and plavix. Fall precautions  GERD- continue protonix for now, monitor his symptoms

## 2012-09-28 DIAGNOSIS — E1159 Type 2 diabetes mellitus with other circulatory complications: Secondary | ICD-10-CM | POA: Diagnosis not present

## 2012-09-28 DIAGNOSIS — E1149 Type 2 diabetes mellitus with other diabetic neurological complication: Secondary | ICD-10-CM | POA: Diagnosis not present

## 2012-09-28 DIAGNOSIS — G909 Disorder of the autonomic nervous system, unspecified: Secondary | ICD-10-CM | POA: Diagnosis not present

## 2012-09-28 DIAGNOSIS — S88119A Complete traumatic amputation at level between knee and ankle, unspecified lower leg, initial encounter: Secondary | ICD-10-CM | POA: Diagnosis not present

## 2012-09-28 DIAGNOSIS — I129 Hypertensive chronic kidney disease with stage 1 through stage 4 chronic kidney disease, or unspecified chronic kidney disease: Secondary | ICD-10-CM | POA: Diagnosis not present

## 2012-09-28 DIAGNOSIS — I96 Gangrene, not elsewhere classified: Secondary | ICD-10-CM | POA: Diagnosis not present

## 2012-09-28 LAB — BASIC METABOLIC PANEL
Calcium: 9.6 mg/dL (ref 8.6–10.2)
Chloride: 104 mmol/L (ref 97–108)
Glucose: 150 mg/dL — ABNORMAL HIGH (ref 65–99)
Potassium: 5.2 mmol/L (ref 3.5–5.2)
Sodium: 142 mmol/L (ref 134–144)

## 2012-09-29 DIAGNOSIS — E1159 Type 2 diabetes mellitus with other circulatory complications: Secondary | ICD-10-CM | POA: Diagnosis not present

## 2012-09-29 DIAGNOSIS — E1149 Type 2 diabetes mellitus with other diabetic neurological complication: Secondary | ICD-10-CM | POA: Diagnosis not present

## 2012-09-29 DIAGNOSIS — I96 Gangrene, not elsewhere classified: Secondary | ICD-10-CM | POA: Diagnosis not present

## 2012-09-29 DIAGNOSIS — I129 Hypertensive chronic kidney disease with stage 1 through stage 4 chronic kidney disease, or unspecified chronic kidney disease: Secondary | ICD-10-CM | POA: Diagnosis not present

## 2012-09-29 DIAGNOSIS — G909 Disorder of the autonomic nervous system, unspecified: Secondary | ICD-10-CM | POA: Diagnosis not present

## 2012-09-29 DIAGNOSIS — S88119A Complete traumatic amputation at level between knee and ankle, unspecified lower leg, initial encounter: Secondary | ICD-10-CM | POA: Diagnosis not present

## 2012-09-29 MED ORDER — AMBULATORY NON FORMULARY MEDICATION: Status: DC

## 2012-10-02 DIAGNOSIS — I129 Hypertensive chronic kidney disease with stage 1 through stage 4 chronic kidney disease, or unspecified chronic kidney disease: Secondary | ICD-10-CM | POA: Diagnosis not present

## 2012-10-02 DIAGNOSIS — G909 Disorder of the autonomic nervous system, unspecified: Secondary | ICD-10-CM | POA: Diagnosis not present

## 2012-10-02 DIAGNOSIS — E1149 Type 2 diabetes mellitus with other diabetic neurological complication: Secondary | ICD-10-CM | POA: Diagnosis not present

## 2012-10-02 DIAGNOSIS — S88119A Complete traumatic amputation at level between knee and ankle, unspecified lower leg, initial encounter: Secondary | ICD-10-CM | POA: Diagnosis not present

## 2012-10-02 DIAGNOSIS — E1159 Type 2 diabetes mellitus with other circulatory complications: Secondary | ICD-10-CM | POA: Diagnosis not present

## 2012-10-02 DIAGNOSIS — I96 Gangrene, not elsewhere classified: Secondary | ICD-10-CM | POA: Diagnosis not present

## 2012-10-03 DIAGNOSIS — E1149 Type 2 diabetes mellitus with other diabetic neurological complication: Secondary | ICD-10-CM | POA: Diagnosis not present

## 2012-10-03 DIAGNOSIS — G909 Disorder of the autonomic nervous system, unspecified: Secondary | ICD-10-CM | POA: Diagnosis not present

## 2012-10-03 DIAGNOSIS — I129 Hypertensive chronic kidney disease with stage 1 through stage 4 chronic kidney disease, or unspecified chronic kidney disease: Secondary | ICD-10-CM | POA: Diagnosis not present

## 2012-10-03 DIAGNOSIS — S88119A Complete traumatic amputation at level between knee and ankle, unspecified lower leg, initial encounter: Secondary | ICD-10-CM | POA: Diagnosis not present

## 2012-10-03 DIAGNOSIS — E1159 Type 2 diabetes mellitus with other circulatory complications: Secondary | ICD-10-CM | POA: Diagnosis not present

## 2012-10-03 DIAGNOSIS — I96 Gangrene, not elsewhere classified: Secondary | ICD-10-CM | POA: Diagnosis not present

## 2012-10-04 DIAGNOSIS — E119 Type 2 diabetes mellitus without complications: Secondary | ICD-10-CM | POA: Diagnosis not present

## 2012-10-04 DIAGNOSIS — I129 Hypertensive chronic kidney disease with stage 1 through stage 4 chronic kidney disease, or unspecified chronic kidney disease: Secondary | ICD-10-CM | POA: Diagnosis not present

## 2012-10-04 DIAGNOSIS — I739 Peripheral vascular disease, unspecified: Secondary | ICD-10-CM | POA: Diagnosis not present

## 2012-10-04 DIAGNOSIS — N184 Chronic kidney disease, stage 4 (severe): Secondary | ICD-10-CM | POA: Diagnosis not present

## 2012-10-04 DIAGNOSIS — I10 Essential (primary) hypertension: Secondary | ICD-10-CM | POA: Diagnosis not present

## 2012-10-05 DIAGNOSIS — E1159 Type 2 diabetes mellitus with other circulatory complications: Secondary | ICD-10-CM | POA: Diagnosis not present

## 2012-10-05 DIAGNOSIS — I96 Gangrene, not elsewhere classified: Secondary | ICD-10-CM | POA: Diagnosis not present

## 2012-10-05 DIAGNOSIS — S88119A Complete traumatic amputation at level between knee and ankle, unspecified lower leg, initial encounter: Secondary | ICD-10-CM | POA: Diagnosis not present

## 2012-10-05 DIAGNOSIS — G909 Disorder of the autonomic nervous system, unspecified: Secondary | ICD-10-CM | POA: Diagnosis not present

## 2012-10-05 DIAGNOSIS — I129 Hypertensive chronic kidney disease with stage 1 through stage 4 chronic kidney disease, or unspecified chronic kidney disease: Secondary | ICD-10-CM | POA: Diagnosis not present

## 2012-10-05 DIAGNOSIS — E1149 Type 2 diabetes mellitus with other diabetic neurological complication: Secondary | ICD-10-CM | POA: Diagnosis not present

## 2012-10-06 DIAGNOSIS — G909 Disorder of the autonomic nervous system, unspecified: Secondary | ICD-10-CM | POA: Diagnosis not present

## 2012-10-06 DIAGNOSIS — S88119A Complete traumatic amputation at level between knee and ankle, unspecified lower leg, initial encounter: Secondary | ICD-10-CM | POA: Diagnosis not present

## 2012-10-06 DIAGNOSIS — E1149 Type 2 diabetes mellitus with other diabetic neurological complication: Secondary | ICD-10-CM | POA: Diagnosis not present

## 2012-10-06 DIAGNOSIS — I96 Gangrene, not elsewhere classified: Secondary | ICD-10-CM | POA: Diagnosis not present

## 2012-10-06 DIAGNOSIS — I129 Hypertensive chronic kidney disease with stage 1 through stage 4 chronic kidney disease, or unspecified chronic kidney disease: Secondary | ICD-10-CM | POA: Diagnosis not present

## 2012-10-06 DIAGNOSIS — E1159 Type 2 diabetes mellitus with other circulatory complications: Secondary | ICD-10-CM | POA: Diagnosis not present

## 2012-10-18 DIAGNOSIS — E1159 Type 2 diabetes mellitus with other circulatory complications: Secondary | ICD-10-CM | POA: Diagnosis not present

## 2012-10-18 DIAGNOSIS — I129 Hypertensive chronic kidney disease with stage 1 through stage 4 chronic kidney disease, or unspecified chronic kidney disease: Secondary | ICD-10-CM | POA: Diagnosis not present

## 2012-10-18 DIAGNOSIS — I96 Gangrene, not elsewhere classified: Secondary | ICD-10-CM | POA: Diagnosis not present

## 2012-10-18 DIAGNOSIS — G909 Disorder of the autonomic nervous system, unspecified: Secondary | ICD-10-CM | POA: Diagnosis not present

## 2012-10-18 DIAGNOSIS — S88119A Complete traumatic amputation at level between knee and ankle, unspecified lower leg, initial encounter: Secondary | ICD-10-CM | POA: Diagnosis not present

## 2012-10-18 DIAGNOSIS — E1149 Type 2 diabetes mellitus with other diabetic neurological complication: Secondary | ICD-10-CM | POA: Diagnosis not present

## 2012-10-24 ENCOUNTER — Other Ambulatory Visit: Payer: Self-pay | Admitting: *Deleted

## 2012-10-24 ENCOUNTER — Telehealth: Payer: Self-pay | Admitting: *Deleted

## 2012-10-24 DIAGNOSIS — Z89511 Acquired absence of right leg below knee: Secondary | ICD-10-CM

## 2012-10-24 NOTE — Telephone Encounter (Signed)
Patient wife stated that patient needs a Rx for a new wheelchair because his broke. [facility] told patient wife that we can fax them one and they will file with insurance. Please advise.

## 2012-10-24 NOTE — Telephone Encounter (Signed)
Order for wheelchair was faxed to [facility]

## 2012-10-24 NOTE — Telephone Encounter (Signed)
Ok to send in script for wheelchair- indication b/l BKA

## 2012-10-26 ENCOUNTER — Other Ambulatory Visit: Payer: Self-pay | Admitting: *Deleted

## 2012-10-26 MED ORDER — HYDROCODONE-ACETAMINOPHEN 5-325 MG PO TABS: 1.0000 | ORAL_TABLET | ORAL | Status: DC | PRN

## 2012-10-27 ENCOUNTER — Other Ambulatory Visit: Payer: Self-pay | Admitting: *Deleted

## 2012-10-27 MED ORDER — AMBULATORY NON FORMULARY MEDICATION: Status: DC

## 2012-11-01 ENCOUNTER — Ambulatory Visit: Payer: Self-pay | Admitting: Internal Medicine

## 2012-11-01 DIAGNOSIS — Z0289 Encounter for other administrative examinations: Secondary | ICD-10-CM

## 2012-11-08 ENCOUNTER — Other Ambulatory Visit: Payer: Self-pay | Admitting: *Deleted

## 2012-11-08 MED ORDER — PREGABALIN 75 MG PO CAPS: 75.0000 mg | ORAL_CAPSULE | Freq: Two times a day (BID) | ORAL | Status: DC

## 2012-11-15 ENCOUNTER — Ambulatory Visit: Payer: Medicare Other | Admitting: Internal Medicine

## 2012-11-15 DIAGNOSIS — Z0289 Encounter for other administrative examinations: Secondary | ICD-10-CM

## 2012-11-17 ENCOUNTER — Other Ambulatory Visit: Payer: Self-pay | Admitting: *Deleted

## 2012-11-28 ENCOUNTER — Other Ambulatory Visit: Payer: Self-pay | Admitting: *Deleted

## 2012-11-28 MED ORDER — HYDROCODONE-ACETAMINOPHEN 5-325 MG PO TABS: 1.0000 | ORAL_TABLET | ORAL | Status: DC | PRN

## 2012-11-28 NOTE — Addendum Note (Signed)
Addended by: Melina Modena F on: 11/28/2012 12:56 PM   Modules accepted: Orders, Medications

## 2012-11-28 NOTE — Telephone Encounter (Signed)
Patient needs to reschedule appointment to be seen before anymore refills after this one. No showed last 2 appointments.

## 2012-11-29 ENCOUNTER — Telehealth: Payer: Self-pay | Admitting: *Deleted

## 2012-11-29 NOTE — Telephone Encounter (Signed)
Patient wife called and stated that patient needed a refill on his narcotic Rx. Wyatt Portela, NP stated that patient will need and appointment before any refills can be given due to No Showed previous appointments. I tried calling the wife and 3 times it was busy and then it would just ring and ring.

## 2012-12-01 ENCOUNTER — Other Ambulatory Visit: Payer: Self-pay

## 2012-12-01 MED ORDER — AMLODIPINE BESYLATE 5 MG PO TABS: 5.0000 mg | ORAL_TABLET | Freq: Every day | ORAL | Status: DC

## 2012-12-01 MED ORDER — INSULIN ASPART 100 UNIT/ML FLEXPEN: PEN_INJECTOR | SUBCUTANEOUS | Status: DC

## 2012-12-01 NOTE — Addendum Note (Signed)
Addended by: Maurice Small on: 12/01/2012 03:07 PM   Modules accepted: Orders

## 2012-12-01 NOTE — Telephone Encounter (Signed)
Patient's wife called today and I discussed that appointment is due prior to refill on controlled substance. Appointment scheduled for Wednesday to see Dr.Pandey, patient's wife then requested to speak with management to see if rx could be released for Hydrocodone since appointment pending.  Patient spoke with Daine Gip (office manager), Aram Beecham spoke with covering provider for the day Candelaria Celeste, NP). No rx to be given until appointment. Patient's wife was informed.

## 2012-12-06 ENCOUNTER — Encounter: Payer: Self-pay | Admitting: Internal Medicine

## 2012-12-06 ENCOUNTER — Ambulatory Visit (INDEPENDENT_AMBULATORY_CARE_PROVIDER_SITE_OTHER): Payer: Medicare Other | Admitting: Internal Medicine

## 2012-12-06 VITALS — BP 182/94 | HR 83 | Temp 98.3°F | Resp 14 | Wt 185.0 lb

## 2012-12-06 DIAGNOSIS — I10 Essential (primary) hypertension: Secondary | ICD-10-CM

## 2012-12-06 DIAGNOSIS — I798 Other disorders of arteries, arterioles and capillaries in diseases classified elsewhere: Secondary | ICD-10-CM | POA: Diagnosis not present

## 2012-12-06 DIAGNOSIS — K5900 Constipation, unspecified: Secondary | ICD-10-CM | POA: Diagnosis not present

## 2012-12-06 DIAGNOSIS — E1151 Type 2 diabetes mellitus with diabetic peripheral angiopathy without gangrene: Secondary | ICD-10-CM

## 2012-12-06 DIAGNOSIS — E785 Hyperlipidemia, unspecified: Secondary | ICD-10-CM

## 2012-12-06 DIAGNOSIS — E1159 Type 2 diabetes mellitus with other circulatory complications: Secondary | ICD-10-CM | POA: Diagnosis not present

## 2012-12-06 DIAGNOSIS — N184 Chronic kidney disease, stage 4 (severe): Secondary | ICD-10-CM

## 2012-12-06 MED ORDER — INSULIN DETEMIR 100 UNIT/ML FLEXPEN: 22.0000 [IU] | PEN_INJECTOR | Freq: Every day | SUBCUTANEOUS | Status: DC

## 2012-12-06 MED ORDER — AMBULATORY NON FORMULARY MEDICATION: Status: DC

## 2012-12-06 MED ORDER — PREGABALIN 75 MG PO CAPS: 75.0000 mg | ORAL_CAPSULE | Freq: Two times a day (BID) | ORAL | Status: DC

## 2012-12-06 MED ORDER — AMLODIPINE BESYLATE 10 MG PO TABS: 10.0000 mg | ORAL_TABLET | Freq: Every day | ORAL | Status: DC

## 2012-12-06 MED ORDER — POLYETHYLENE GLYCOL 3350 17 GM/SCOOP PO POWD: 17.0000 g | Freq: Every day | ORAL | Status: DC

## 2012-12-06 MED ORDER — PANTOPRAZOLE SODIUM 40 MG PO TBEC: 40.0000 mg | DELAYED_RELEASE_TABLET | Freq: Every day | ORAL | Status: DC

## 2012-12-06 MED ORDER — METOPROLOL SUCCINATE 12.5 MG HALF TABLET: 12.5000 mg | ORAL_TABLET | Freq: Every day | ORAL | Status: DC

## 2012-12-06 MED ORDER — INSULIN ASPART 100 UNIT/ML FLEXPEN: PEN_INJECTOR | SUBCUTANEOUS | Status: DC

## 2012-12-06 MED ORDER — CLOPIDOGREL BISULFATE 75 MG PO TABS: 75.0000 mg | ORAL_TABLET | Freq: Every day | ORAL | Status: DC

## 2012-12-06 MED ORDER — PRAVASTATIN SODIUM 20 MG PO TABS: 20.0000 mg | ORAL_TABLET | Freq: Every day | ORAL | Status: DC

## 2012-12-06 MED ORDER — DOXAZOSIN MESYLATE 1 MG PO TABS: 1.0000 mg | ORAL_TABLET | Freq: Every day | ORAL | Status: DC

## 2012-12-06 NOTE — Progress Notes (Signed)
Patient ID: Aaron Long, male   DOB: [birth year], 72 y.o.   MRN: [id number]     Chief Complaint  Patient presents with  . Follow-up    Medication management/refills  . Immunizations    ? thinks it was last year   Allergies  Allergen Reactions  . Oxycodone Other (See Comments)    Hallucinations   HPI 72 y/o male patient is here for routine follow up. His SBP is elevated in office today. cbg in am 84-253 with most of it in 100s. Reviewed his home reading. cbg this am was 141. Compliant with his bp medication Pain in right leg is under control. He is on lyrica  Review of Systems   Constitutional: Negative for diaphoresis, fever or chills HENT: Negative for congestion and sore throat.    Eyes: Negative for blurred vision and double vision.   Respiratory: Negative for cough, shortness of breath and wheezing.    Cardiovascular: Negative for chest pain, palpitation and leg swelling.   Gastrointestinal: Negative for heartburn, nausea, vomiting, abdominal pain, diarrhea. Has constipation and last bowel movement 3 days back. Has flatulence and growling in his belly with occassional abdominal discomfort. Genitourinary: Negative for dysuria, hematuria, flank pain Musculoskeletal: Negative for myalgias and falls.  has both legs amputated Skin: Negative for rash.   Neurological: Negative for dizziness and headaches.   Psychiatric/Behavioral: Negative for depression and memory loss. The patient is not nervous/anxious. no sleep disturbances  Past Medical History  Diagnosis Date  . Prostate cancer 2010  . PAD (peripheral artery disease)   . Gangrene of toe     dry  . Neuromuscular disorder     diabetic neuropathy  . Hypertension     takes Amlodipine daily and Metoprolol bid  . Hyperlipidemia     takes Pravastatin daily  . Myocardial infarction 2005  . Peripheral neuropathy   . Constipation     takes Miralax daily  . Hemorrhoids   . Hx of colonic polyps   . History of kidney  stones   . Diabetes mellitus     Novolog and Levemir daily  . Coronary artery disease   . BPH (benign prostatic hypertrophy)   . Abnormality of gait   . Coronary atherosclerosis of native coronary artery   . Phlebitis and thrombophlebitis of other deep vessels of lower extremities   . Chronic kidney disease   . Hypertrophy of prostate without urinary obstruction and other lower urinary tract symptoms (LUTS)   . Disorder of bone and cartilage, unspecified   . Lower limb amputation, below knee   . Long term (current) use of anticoagulants   . Anemia 06/19/2012   Current Outpatient Prescriptions on File Prior to Visit  Medication Sig Dispense Refill  . AMBULATORY NON FORMULARY MEDICATION One Touch Ultra 2 Test Strips Sig:  Check blood sugars Three times daily to keep blood sugars under control Dx: 250.00, 401.9  100 strip  11  . AMBULATORY NON FORMULARY MEDICATION BD U/P Mini Pen [place] 31GX5MM Use three times daily as directed DX:250.00  100 each  11  . amLODipine (NORVASC) 5 MG tablet Take 1 tablet (5 mg total) by mouth daily.  90 tablet  0  . aspirin EC 81 MG tablet Take 1 tablet (81 mg total) by mouth every morning.      . clopidogrel (PLAVIX) 75 MG tablet Take 1 tablet (75 mg total) by mouth daily.  90 tablet  1  . doxazosin (CARDURA) 1 MG tablet Take 1  tablet (1 mg total) by mouth at bedtime.  90 tablet  1  . insulin aspart (NOVOLOG FLEXPEN) 100 UNIT/ML SOPN FlexPen Inject 5 units if blood sugar greater than 150, DX: 250.71  3 mL  3  . Insulin Detemir (LEVEMIR FLEXPEN) 100 UNIT/ML SOPN Inject 18 Units into the skin at bedtime.  20 pen  3  . metoprolol succinate (TOPROL-XL) 12.5 mg TB24 24 hr tablet Take 0.5 tablets (12.5 mg total) by mouth daily.  90 tablet  3  . pantoprazole (PROTONIX) 40 MG tablet Take 1 tablet (40 mg total) by mouth daily.  90 tablet  3  . pregabalin (LYRICA) 75 MG capsule Take 1 capsule (75 mg total) by mouth 2 (two) times daily.  180 capsule  3  . acetaminophen  (TYLENOL) 325 MG tablet Take 325 mg by mouth every 6 (six) hours as needed for pain or fever.      [name] atorvastatin (LIPITOR) 10 MG tablet Take 1 tablet (10 mg total) by mouth daily.  90 tablet  1   No current facility-administered medications on file prior to visit.   Past Surgical History  Procedure Laterality Date  . Prostate surgery  2009    prostatectomy  . Knee cartilage surgery  at age 23    left knee  . Cea      Right  . Coronary artery bypass graft  2005    x 4  . Carotid endarterectomy  2005    right  . Cataract surgery  2011    bilateral  . Colonoscopy    . Cardiac catheterization  05/28/11  . Amputation  06/14/2011    Procedure: AMPUTATION DIGIT;  Surgeon: Sherren Kerns, MD;  Location: [facility] OR;  Service: Vascular;  Laterality: Left;  Amputation Left fifth toe  . Amputation  06/16/2011    Procedure: AMPUTATION BELOW KNEE;  Surgeon: Sherren Kerns, MD;  Location: [facility] OR;  Service: Vascular;  Laterality: Left;  . I&d extremity  01/31/2012    Procedure: IRRIGATION AND DEBRIDEMENT EXTREMITY;  Surgeon: Sherren Kerns, MD;  Location: [facility] OR;  Service: Vascular;  Laterality: Left;  I & D Left BKA   . Amputation Right 06/12/2012    Procedure: AMPUTATION DIGIT;  Surgeon: Sherren Kerns, MD;  Location: [facility] OR;  Service: Vascular;  Laterality: Right;  GREAT TOE  . Below knee leg amputation Right 08/08/2012    Dr Darrick Penna  . Amputation Right 08/08/2012    Procedure: AMPUTATION BELOW KNEE;  Surgeon: Sherren Kerns, MD;  Location: [facility] OR;  Service: Vascular;  Laterality: Right;    Physical exam  BP 182/94  Pulse 83  Temp(Src) 98.3 F (36.8 C) (Oral)  Resp 14  Wt 185 lb (83.915 kg)  SpO2 96%  General- elderly male in no acute distress Head- atraumatic, normocephalic Eyes- PERRLA, EOMI, no pallor, no icterus Neck- no lymphadenopathy, no thyromegaly, no jugular vein distension, no carotid bruit Chest- no chest wall deformities, no chest wall tenderness Cardiovascular- normal  s1,s2, no murmurs/ rubs/ gallops Respiratory- bilateral clear to auscultation, no wheeze, no rhonchi, no crackles Abdomen- bowel sounds present, soft, non tender, no organomegaly, no abdominal bruits, no guarding or rigidity, no CVA tenderness Musculoskeletal- able to move his upper extremities, no spinal and paraspinal tenderness, has bilateral BKA and uses a wheelchair Neurological- no focal deficit, normal reflexes, normal muscle strength, normal sensation to fine touch and vibration Psychiatry- alert and oriented to person, place and time, normal mood and affect  Labs-  CBC    Component Value Date/Time   WBC 6.6 09/06/2012 1620   WBC 7.6 08/16/2012 0630   RBC 3.52* 09/06/2012 1620   RBC 2.93* 08/16/2012 0630   HGB 10.1* 09/06/2012 1620   HCT 32.8* 09/06/2012 1620   PLT 347 08/16/2012 0630   MCV 93 09/06/2012 1620   MCH 28.7 09/06/2012 1620   MCH 28.3 08/16/2012 0630   MCHC 30.8* 09/06/2012 1620   MCHC 32.2 08/16/2012 0630   RDW 16.8* 09/06/2012 1620   RDW 16.3* 08/16/2012 0630   LYMPHSABS 2.3 09/06/2012 1620   LYMPHSABS 1.7 08/14/2012 0605   MONOABS 0.7 08/14/2012 0605   EOSABS 0.3 09/06/2012 1620   EOSABS 0.4 08/14/2012 0605   BASOSABS 0.0 09/06/2012 1620   BASOSABS 0.0 08/14/2012 0605    CMP     Component Value Date/Time   NA 142 09/27/2012 1638   NA 137 08/14/2012 0605   K 5.2 09/27/2012 1638   CL 104 09/27/2012 1638   CO2 23 09/27/2012 1638   GLUCOSE 150* 09/27/2012 1638   GLUCOSE 137* 08/14/2012 0605   BUN 36* 09/27/2012 1638   BUN 35* 08/14/2012 0605   CREATININE 2.23* 09/27/2012 1638   CALCIUM 9.6 09/27/2012 1638   PROT 6.4 09/06/2012 1620   PROT 6.5 08/14/2012 0605   ALBUMIN 2.5* 08/14/2012 0605   AST 25 09/06/2012 1620   ALT 45* 09/06/2012 1620   ALKPHOS 144* 09/06/2012 1620   BILITOT 0.1 09/06/2012 1620   GFRNONAA 28* 09/27/2012 1638   GFRAA 33* 09/27/2012 1638   Lab Results  Component Value Date   HGBA1C 7.2* 09/06/2012   Assessment/plan  1. Diabetes mellitus with peripheral vascular  disease Check a1c today. His fasting sugar has been elevated. Will change his levemir to 22 u and continue novolog for now. Continue asprin and statin. Not on ACEI given worsening renal function. uptodate with influenza and pneumococcal vaccine  2. HTN (hypertension) bp remains elevated on repeat check with 160/82. Will increase his amlodipine to 10 mg daily. Continue toprol xl and cardura. Reassess in a month  3. Unspecified constipation Will have him on miralax 17 g daily for now and reassess if no imporvement with his bowel movement. Encouraged fibre intake.   4. CKD (chronic kidney disease) stage 4, GFR 15-29 ml/min longstanding uncontrolled dm, hypertension and PVD- follows with renal. He continues to take NSAIDs ocassionally. Counseled on not taking it and its side effects. Pt and wife voices understanding this  5. Hyperlipidemia Continue pravastatin and check lipid panel today

## 2012-12-07 LAB — BASIC METABOLIC PANEL
BUN: 33 mg/dL — ABNORMAL HIGH (ref 8–27)
Calcium: 9.7 mg/dL (ref 8.6–10.2)
Chloride: 103 mmol/L (ref 97–108)
Creatinine, Ser: 2.62 mg/dL — ABNORMAL HIGH (ref 0.76–1.27)
GFR calc non Af Amer: 23 mL/min/{1.73_m2} — ABNORMAL LOW (ref >59)
Glucose: 169 mg/dL — ABNORMAL HIGH (ref 65–99)
Potassium: 5.1 mmol/L (ref 3.5–5.2)

## 2012-12-07 LAB — LIPID PANEL
Cholesterol, Total: 215 mg/dL — ABNORMAL HIGH (ref 100–199)
HDL: 33 mg/dL — ABNORMAL LOW (ref >39)
VLDL Cholesterol Cal: 36 mg/dL (ref 5–40)

## 2012-12-07 LAB — HEMOGLOBIN A1C: Est. average glucose Bld gHb Est-mCnc: 217 mg/dL

## 2012-12-13 ENCOUNTER — Encounter: Payer: Self-pay | Admitting: *Deleted

## 2013-01-23 ENCOUNTER — Encounter: Payer: Self-pay | Admitting: Internal Medicine

## 2013-01-23 ENCOUNTER — Ambulatory Visit (INDEPENDENT_AMBULATORY_CARE_PROVIDER_SITE_OTHER): Payer: Medicare Other | Admitting: Internal Medicine

## 2013-01-23 VITALS — BP 148/80 | HR 78 | Temp 99.3°F | Resp 10

## 2013-01-23 DIAGNOSIS — K219 Gastro-esophageal reflux disease without esophagitis: Secondary | ICD-10-CM

## 2013-01-23 DIAGNOSIS — I10 Essential (primary) hypertension: Secondary | ICD-10-CM

## 2013-01-23 DIAGNOSIS — K5900 Constipation, unspecified: Secondary | ICD-10-CM

## 2013-01-23 DIAGNOSIS — E1149 Type 2 diabetes mellitus with other diabetic neurological complication: Secondary | ICD-10-CM | POA: Diagnosis not present

## 2013-01-23 DIAGNOSIS — E1140 Type 2 diabetes mellitus with diabetic neuropathy, unspecified: Secondary | ICD-10-CM

## 2013-01-23 DIAGNOSIS — IMO0002 Reserved for concepts with insufficient information to code with codable children: Secondary | ICD-10-CM

## 2013-01-23 DIAGNOSIS — E1142 Type 2 diabetes mellitus with diabetic polyneuropathy: Secondary | ICD-10-CM

## 2013-01-23 DIAGNOSIS — E1165 Type 2 diabetes mellitus with hyperglycemia: Principal | ICD-10-CM

## 2013-01-23 DIAGNOSIS — I739 Peripheral vascular disease, unspecified: Secondary | ICD-10-CM

## 2013-01-23 DIAGNOSIS — M542 Cervicalgia: Secondary | ICD-10-CM

## 2013-01-23 MED ORDER — INSULIN DETEMIR 100 UNIT/ML FLEXPEN: 25.0000 [IU] | PEN_INJECTOR | Freq: Every day | SUBCUTANEOUS | Status: DC

## 2013-01-23 MED ORDER — TRAMADOL HCL 50 MG PO TABS
50.0000 mg | ORAL_TABLET | Freq: Three times a day (TID) | ORAL | Status: DC | PRN
Start: 2013-01-23 — End: 2013-03-02

## 2013-01-23 MED ORDER — METHOCARBAMOL 500 MG PO TABS: 500.0000 mg | ORAL_TABLET | Freq: Three times a day (TID) | ORAL | Status: DC | PRN

## 2013-01-23 NOTE — Progress Notes (Signed)
Patient ID: Aaron Long, male   DOB: March 26, 1940, 73 y.o.   MRN: [id number]    Chief Complaint  Patient presents with  . Medical Managment of Chronic Issues    dm, HTN   Allergies  Allergen Reactions  . Oxycodone Other (See Comments)    Hallucinations   HPI 73 y/o male patient is here for routine follow up.  He mentions feeling low at times but does not want to be on any mood medication Blood sugar this am was 296. He forgot to take his levemir yesterday.  average reading has been 215-218.  Has been having pain in right neck area for 2 weeks. It runs to his shoulder and feels it to have improved compared to 2 weeks back but some discomfort persists. No trauma history  Review of Systems   Constitutional: Negative for diaphoresis, fever or chills HENT: Negative for congestion and sore throat.    Eyes: Negative for blurred vision and double vision.   Respiratory: Negative for cough, shortness of breath and wheezing.    Cardiovascular: Negative for chest pain, palpitation  Gastrointestinal: Negative for heartburn, nausea, vomiting, abdominal pain, diarrhea. Constipation has improved Genitourinary: Negative for dysuria, hematuria, flank pain Musculoskeletal: Negative for myalgias and falls.  has both legs amputated Skin: Negative for rash.   Neurological: Negative for dizziness and headaches.   Psychiatric/Behavioral: Negative for memory loss. Positive for depression. The patient is not nervous/anxious. no sleep disturbances  Past Medical History  Diagnosis Date  . Prostate cancer 2010  . PAD (peripheral artery disease)   . Gangrene of toe     dry  . Neuromuscular disorder     diabetic neuropathy  . Hypertension     takes Amlodipine daily and Metoprolol bid  . Hyperlipidemia     takes Pravastatin daily  . Myocardial infarction 2005  . Peripheral neuropathy   . Constipation     takes Miralax daily  . Hemorrhoids   . Hx of colonic polyps   . History of kidney stones     . Diabetes mellitus     Novolog and Levemir daily  . Coronary artery disease   . BPH (benign prostatic hypertrophy)   . Abnormality of gait   . Coronary atherosclerosis of native coronary artery   . Phlebitis and thrombophlebitis of other deep vessels of lower extremities   . Chronic kidney disease   . Hypertrophy of prostate without urinary obstruction and other lower urinary tract symptoms (LUTS)   . Disorder of bone and cartilage, unspecified   . Lower limb amputation, below knee   . Long term (current) use of anticoagulants   . Anemia 06/19/2012   Medication reviewed. See [facility]  Physical exam BP 148/80  Pulse 78  Temp(Src) 99.3 F (37.4 C) (Oral)  Resp 10  SpO2 97%  General- elderly male in no acute distress Head- atraumatic, normocephalic Eyes- PERRLA, EOMI, no pallor, no icterus Neck- no lymphadenopathy, no thyromegaly, no jugular vein distension, no carotid bruit Chest- no chest wall deformities, no chest wall tenderness Cardiovascular- normal s1,s2, no murmurs/ rubs/ gallops Respiratory- bilateral clear to auscultation, no wheeze, no rhonchi, no crackles Abdomen- bowel sounds present, soft, non tender, no organomegaly, no abdominal bruits, no guarding or rigidity, no CVA tenderness Musculoskeletal- able to move his upper extremities, no spinal and paraspinal tenderness, has bilateral BKA and uses a wheelchair. Has tenderness on palpation around right sternocleidomastoid muscles Neurological- no focal deficit, normal reflexes, normal muscle strength, normal sensation to fine touch  and vibration Psychiatry- alert and oriented to person, place and time, normal mood and affect  Labs- Lab Results  Component Value Date   HGBA1C 9.2* 12/06/2012   CMP     Component Value Date/Time   NA 142 12/06/2012 1045   NA 137 08/14/2012 0605   K 5.1 12/06/2012 1045   CL 103 12/06/2012 1045   CO2 26 12/06/2012 1045   GLUCOSE 169* 12/06/2012 1045   GLUCOSE 137* 08/14/2012 0605   BUN  33* 12/06/2012 1045   BUN 35* 08/14/2012 0605   CREATININE 2.62* 12/06/2012 1045   CALCIUM 9.7 12/06/2012 1045   PROT 6.4 09/06/2012 1620   PROT 6.5 08/14/2012 0605   ALBUMIN 2.5* 08/14/2012 0605   AST 25 09/06/2012 1620   ALT 45* 09/06/2012 1620   ALKPHOS 144* 09/06/2012 1620   BILITOT 0.1 09/06/2012 1620   GFRNONAA 23* 12/06/2012 1045   GFRAA 27* 12/06/2012 1045   Assessment/plan  1. Diabetes mellitus with peripheral vascular disease With elevated cbg readings, will increase his levemir to 25 u and continue novolog for now.   Continue asprin and statin. Not on ACEI given worsening renal function. uptodate with influenza and pneumococcal vaccine. Recheck a1c prior to next visit  2. HTN (hypertension) Improved bp readings. continue amlodipine to 10 mg daily with toprol xl 12.5 mg daily and cardura.  3. Peripheral vascular disease, unspecified Monitor bp readings. S/p both leg amputation. Continue aspirin  4. Unspecified constipation Continue miralax  5. GERD (gastroesophageal reflux disease) Continue protonix for now  6. Neck pain on right side Likely musculoskeletal. Will have him on robaxin prn for muscle relaxant and tramadol prn for pain. Also recommended heat pack

## 2013-01-23 NOTE — Progress Notes (Signed)
This encounter was created in error - please disregard.

## 2013-01-26 ENCOUNTER — Telehealth: Payer: Self-pay | Admitting: *Deleted

## 2013-01-26 NOTE — Telephone Encounter (Signed)
Pt's wife, [name], called regarding having some N&V after taking his medications and was asking about getting an Phenergan.    After speaking to her, she states that he is feeling okay right now and doesn't need the medication.  Advise to call later if needed. She aware/agreeable.

## 2013-02-15 ENCOUNTER — Telehealth: Payer: Self-pay | Admitting: *Deleted

## 2013-02-15 NOTE — Telephone Encounter (Signed)
Patient wife called and stated that patient has been burping and it smells like when he passes gas. Michela Pitcher it had a bowel smell to it. Told her that he needed an appointment to be evaluated and offered an appointment for today. She refused. I told her that he should be evaluated for this and she refused and just wanted something called in, I told her again that he needed to be seen and offered appointment again, she refused. Suggested her taking him to an Urgent Care. She hung up

## 2013-03-02 ENCOUNTER — Other Ambulatory Visit: Payer: Self-pay | Admitting: *Deleted

## 2013-03-02 MED ORDER — TRAMADOL HCL 50 MG PO TABS: 50.0000 mg | ORAL_TABLET | Freq: Three times a day (TID) | ORAL | Status: DC | PRN

## 2013-03-08 ENCOUNTER — Other Ambulatory Visit: Payer: Medicare Other

## 2013-03-09 NOTE — Progress Notes (Signed)
This encounter was created in error - please disregard.

## 2013-03-13 ENCOUNTER — Ambulatory Visit: Payer: Medicare Other | Attending: Vascular Surgery | Admitting: Physical Therapy

## 2013-03-13 DIAGNOSIS — M6281 Muscle weakness (generalized): Secondary | ICD-10-CM | POA: Insufficient documentation

## 2013-03-13 DIAGNOSIS — Z4789 Encounter for other orthopedic aftercare: Secondary | ICD-10-CM | POA: Insufficient documentation

## 2013-03-13 DIAGNOSIS — R269 Unspecified abnormalities of gait and mobility: Secondary | ICD-10-CM | POA: Insufficient documentation

## 2013-03-13 DIAGNOSIS — R5381 Other malaise: Secondary | ICD-10-CM | POA: Diagnosis not present

## 2013-03-14 ENCOUNTER — Ambulatory Visit: Payer: Medicare Other | Admitting: Physical Therapy

## 2013-03-19 ENCOUNTER — Ambulatory Visit: Payer: Medicare Other | Admitting: Physical Therapy

## 2013-03-21 ENCOUNTER — Ambulatory Visit: Payer: Medicare Other | Admitting: Physical Therapy

## 2013-03-26 ENCOUNTER — Ambulatory Visit: Payer: Medicare Other | Admitting: Physical Therapy

## 2013-03-27 ENCOUNTER — Ambulatory Visit: Payer: Medicare Other | Admitting: Internal Medicine

## 2013-03-28 ENCOUNTER — Ambulatory Visit: Payer: Medicare Other | Admitting: Physical Therapy

## 2013-04-02 ENCOUNTER — Ambulatory Visit: Payer: Medicare Other | Admitting: Physical Therapy

## 2013-04-04 ENCOUNTER — Ambulatory Visit: Payer: Medicare Other | Admitting: Physical Therapy

## 2013-04-09 ENCOUNTER — Ambulatory Visit: Payer: Medicare Other | Admitting: Physical Therapy

## 2013-04-10 ENCOUNTER — Ambulatory Visit (INDEPENDENT_AMBULATORY_CARE_PROVIDER_SITE_OTHER): Payer: Medicare Other | Admitting: Internal Medicine

## 2013-04-10 ENCOUNTER — Encounter: Payer: Self-pay | Admitting: Internal Medicine

## 2013-04-10 VITALS — BP 164/86 | HR 76 | Temp 98.2°F

## 2013-04-10 DIAGNOSIS — K219 Gastro-esophageal reflux disease without esophagitis: Secondary | ICD-10-CM | POA: Diagnosis not present

## 2013-04-10 DIAGNOSIS — E1149 Type 2 diabetes mellitus with other diabetic neurological complication: Secondary | ICD-10-CM

## 2013-04-10 DIAGNOSIS — E785 Hyperlipidemia, unspecified: Secondary | ICD-10-CM | POA: Diagnosis not present

## 2013-04-10 DIAGNOSIS — I798 Other disorders of arteries, arterioles and capillaries in diseases classified elsewhere: Secondary | ICD-10-CM

## 2013-04-10 DIAGNOSIS — M542 Cervicalgia: Secondary | ICD-10-CM

## 2013-04-10 DIAGNOSIS — K5900 Constipation, unspecified: Secondary | ICD-10-CM | POA: Diagnosis not present

## 2013-04-10 DIAGNOSIS — E1159 Type 2 diabetes mellitus with other circulatory complications: Secondary | ICD-10-CM | POA: Diagnosis not present

## 2013-04-10 DIAGNOSIS — I10 Essential (primary) hypertension: Secondary | ICD-10-CM | POA: Diagnosis not present

## 2013-04-10 DIAGNOSIS — E1140 Type 2 diabetes mellitus with diabetic neuropathy, unspecified: Secondary | ICD-10-CM

## 2013-04-10 DIAGNOSIS — E1151 Type 2 diabetes mellitus with diabetic peripheral angiopathy without gangrene: Secondary | ICD-10-CM

## 2013-04-10 DIAGNOSIS — N184 Chronic kidney disease, stage 4 (severe): Secondary | ICD-10-CM

## 2013-04-10 MED ORDER — METOPROLOL SUCCINATE ER 25 MG PO TB24: 25.0000 mg | ORAL_TABLET | Freq: Every day | ORAL | Status: DC

## 2013-04-10 MED ORDER — FENTANYL 25 MCG/HR TD PT72: 25.0000 ug | MEDICATED_PATCH | TRANSDERMAL | Status: DC

## 2013-04-10 NOTE — Progress Notes (Signed)
Patient ID: Aaron Long, male   DOB: [birth year], 73 y.o.   MRN: [id number]     Chief Complaint  Patient presents with  . Medical Managment of Chronic Issues    follow up visit   Allergies  Allergen Reactions  . Oxycodone Other (See Comments)    Hallucinations   HPI 73 y/o male patient is here for routine follow up visit. His wife is present with him. His bp is elevated this office visit. He is complaint with his medications. He is irregular with his cbg check but sugar have been between 90-200s. No hypoglycemic episodes  Review of Systems   Constitutional: Negative for diaphoresis, fever or chills HENT: Negative for congestion and sore throat.    Eyes: Negative for blurred vision and double vision.   Respiratory: Negative for cough, shortness of breath and wheezing.    Cardiovascular: Negative for chest pain, palpitation   Gastrointestinal: Negative for heartburn, nausea, vomiting, abdominal pain, diarrhea.  Genitourinary: Negative for dysuria, hematuria, flank pain Musculoskeletal: Negative for myalgias and falls.  has both legs amputated Skin: Negative for rash.   Neurological: Negative for dizziness and headaches.   Psychiatric/Behavioral: Negative for memory loss. The patient is not nervous/anxious. no sleep disturbances  Past Medical History  Diagnosis Date  . Prostate cancer 2010  . PAD (peripheral artery disease)   . Gangrene of toe     dry  . Neuromuscular disorder     diabetic neuropathy  . Hypertension     takes Amlodipine daily and Metoprolol bid  . Hyperlipidemia     takes Pravastatin daily  . Myocardial infarction 2005  . Peripheral neuropathy   . Constipation     takes Miralax daily  . Hemorrhoids   . Hx of colonic polyps   . History of kidney stones   . Diabetes mellitus     Novolog and Levemir daily  . Coronary artery disease   . BPH (benign prostatic hypertrophy)   . Abnormality of gait   . Coronary atherosclerosis of native coronary artery    . Phlebitis and thrombophlebitis of other deep vessels of lower extremities   . Chronic kidney disease   . Hypertrophy of prostate without urinary obstruction and other lower urinary tract symptoms (LUTS)   . Disorder of bone and cartilage, unspecified   . Lower limb amputation, below knee   . Long term (current) use of anticoagulants   . Anemia 06/19/2012   Past Surgical History  Procedure Laterality Date  . Prostate surgery  2009    prostatectomy  . Knee cartilage surgery  at age 27    left knee  . Cea      Right  . Coronary artery bypass graft  2005    x 4  . Carotid endarterectomy  2005    right  . Cataract surgery  2011    bilateral  . Colonoscopy    . Cardiac catheterization  05/28/11  . Amputation  06/14/2011    Procedure: AMPUTATION DIGIT;  Surgeon: Elam Dutch, MD;  Location: [facility] OR;  Service: Vascular;  Laterality: Left;  Amputation Left fifth toe  . Amputation  06/16/2011    Procedure: AMPUTATION BELOW KNEE;  Surgeon: Elam Dutch, MD;  Location: [place];  Service: Vascular;  Laterality: Left;  . I&d extremity  01/31/2012    Procedure: IRRIGATION AND DEBRIDEMENT EXTREMITY;  Surgeon: Elam Dutch, MD;  Location: [place];  Service: Vascular;  Laterality: Left;  I & D Left BKA   .  Amputation Right 06/12/2012    Procedure: AMPUTATION DIGIT;  Surgeon: Elam Dutch, MD;  Location: [facility] OR;  Service: Vascular;  Laterality: Right;  GREAT TOE  . Below knee leg amputation Right 08/08/2012    Dr Oneida Alar  . Amputation Right 08/08/2012    Procedure: AMPUTATION BELOW KNEE;  Surgeon: Elam Dutch, MD;  Location: [facility] OR;  Service: Vascular;  Laterality: Right;   Current Outpatient Prescriptions on File Prior to Visit  Medication Sig Dispense Refill  . acetaminophen (TYLENOL) 325 MG tablet Take 325 mg by mouth every 6 (six) hours as needed for pain or fever.      . AMBULATORY NON FORMULARY MEDICATION One Touch Ultra 2 Test Strips Sig:  Check blood sugars Three times daily to  keep blood sugars under control Dx: 250.00, 401.9  100 strip  11  . AMBULATORY NON FORMULARY MEDICATION BD U/P Mini Pen Neddles 31GX5MM Use three times daily as directed DX:250.00  100 each  11  . amLODipine (NORVASC) 10 MG tablet Take 1 tablet (10 mg total) by mouth daily.  90 tablet  3  . aspirin EC 81 MG tablet Take 1 tablet (81 mg total) by mouth every morning.      . clopidogrel (PLAVIX) 75 MG tablet Take 1 tablet (75 mg total) by mouth daily.  90 tablet  1  . doxazosin (CARDURA) 1 MG tablet Take 1 tablet (1 mg total) by mouth at bedtime.  90 tablet  1  . insulin aspart (NOVOLOG FLEXPEN) 100 UNIT/ML SOPN FlexPen Inject 5 units if blood sugar greater than 150, DX: 250.71  3 mL  3  . Insulin Detemir (LEVEMIR FLEXPEN) 100 UNIT/ML Pen Inject 25 Units into the skin at bedtime.  20 pen  3  . pantoprazole (PROTONIX) 40 MG tablet Take 1 tablet (40 mg total) by mouth daily.  90 tablet  1  . polyethylene glycol powder (GLYCOLAX/MIRALAX) powder Take 17 g by mouth daily.  3350 g  1  . pravastatin (PRAVACHOL) 20 MG tablet Take 1 tablet (20 mg total) by mouth daily.  90 tablet  3  . pregabalin (LYRICA) 75 MG capsule Take 1 capsule (75 mg total) by mouth 2 (two) times daily.  180 capsule  0   No current facility-administered medications on file prior to visit.   Physical exam BP 164/86  Pulse 76  Temp(Src) 98.2 F (36.8 C) (Oral)  General- elderly male in no acute distress Head- atraumatic, normocephalic Eyes- PERRLA, EOMI, no pallor, no icterus Neck- no lymphadenopathy, no thyromegaly, no jugular vein distension, no carotid bruit Chest- no chest wall deformities, no chest wall tenderness Cardiovascular- normal s1,s2, no murmurs/ rubs/ gallops Respiratory- bilateral clear to auscultation, no wheeze, no rhonchi, no crackles Abdomen- bowel sounds present, soft, non tender Musculoskeletal- able to move his upper extremities, no spinal and paraspinal tenderness, has bilateral BKA and uses a  wheelchair Neurological- no focal deficit Psychiatry- alert and oriented to person, place and time, normal mood and affect   Labs- Lab Results  Component Value Date   WBC 6.6 09/06/2012   HGB 10.1* 09/06/2012   HCT 32.8* 09/06/2012   MCV 93 09/06/2012   PLT 347 08/16/2012    CMP     Component Value Date/Time   NA 142 12/06/2012 1045   NA 137 08/14/2012 0605   K 5.1 12/06/2012 1045   CL 103 12/06/2012 1045   CO2 26 12/06/2012 1045   GLUCOSE 169* 12/06/2012 1045   GLUCOSE 137* 08/14/2012  2355   BUN 33* 12/06/2012 1045   BUN 35* 08/14/2012 0605   CREATININE 2.62* 12/06/2012 1045   CALCIUM 9.7 12/06/2012 1045   PROT 6.4 09/06/2012 1620   PROT 6.5 08/14/2012 0605   ALBUMIN 2.5* 08/14/2012 0605   AST 25 09/06/2012 1620   ALT 45* 09/06/2012 1620   ALKPHOS 144* 09/06/2012 1620   BILITOT 0.1 09/06/2012 1620   GFRNONAA 23* 12/06/2012 1045   GFRAA 27* 12/06/2012 1045    Lab Results  Component Value Date   HGBA1C 9.2* 12/06/2012    Assessment/plan  1. Diabetes mellitus with peripheral vascular disease Check a1c today. Continue levemir 25 u daily with premeal aspart. Monitor cbg. Continue aspirin and stati - CMP - CBC with Differential - Hemoglobin A1c - Microalbumin/Creatinine Ratio, Urine  2. HTN (hypertension) Elevated bp readings. Increase toprol xl to 25 mg daily  3. GERD (gastroesophageal reflux disease) Continue protonix 40 mg daily for now  4. Unspecified constipation Continue miralax for now  5. CKD (chronic kidney disease) stage 4, GFR 15-29 ml/min Monitor renal function. Check urine microlbumin and if microalbuminuria noted, consider ACEI  6. Other and unspecified hyperlipidemia Continue pravastatin for now - CMP - Lipid Panel  7. Cervicalgia Will d/c tramadol as not helpful. Start fentanyl patch for now 25 mcg q3days and reassess. Check cervcial spine xray - DG Cervical Spine Complete; Future  8. Diabetic neuropathy, type II diabetes mellitus Continue lyrica for  now

## 2013-04-11 ENCOUNTER — Ambulatory Visit: Payer: Medicare Other | Attending: Vascular Surgery | Admitting: Physical Therapy

## 2013-04-11 DIAGNOSIS — R5381 Other malaise: Secondary | ICD-10-CM | POA: Insufficient documentation

## 2013-04-11 DIAGNOSIS — R269 Unspecified abnormalities of gait and mobility: Secondary | ICD-10-CM | POA: Insufficient documentation

## 2013-04-11 DIAGNOSIS — M6281 Muscle weakness (generalized): Secondary | ICD-10-CM | POA: Insufficient documentation

## 2013-04-11 DIAGNOSIS — Z4789 Encounter for other orthopedic aftercare: Secondary | ICD-10-CM | POA: Insufficient documentation

## 2013-04-11 LAB — LIPID PANEL
CHOL/HDL RATIO: 5.8 ratio — AB (ref 0.0–5.0)
Cholesterol, Total: 197 mg/dL (ref 100–199)
HDL: 34 mg/dL — ABNORMAL LOW (ref >39)
LDL Calculated: 137 mg/dL — ABNORMAL HIGH (ref 0–99)
Triglycerides: 131 mg/dL (ref 0–149)
VLDL Cholesterol Cal: 26 mg/dL (ref 5–40)

## 2013-04-11 LAB — COMPREHENSIVE METABOLIC PANEL
A/G RATIO: 1.7 (ref 1.1–2.5)
ALT: 9 IU/L (ref 0–44)
AST: 18 IU/L (ref 0–40)
Albumin: 3.8 g/dL (ref 3.5–4.8)
Alkaline Phosphatase: 129 IU/L — ABNORMAL HIGH (ref 39–117)
BUN/Creatinine Ratio: 12 (ref 10–22)
BUN: 36 mg/dL — ABNORMAL HIGH (ref 8–27)
CO2: 21 mmol/L (ref 18–29)
Calcium: 9.3 mg/dL (ref 8.6–10.2)
Chloride: 107 mmol/L (ref 97–108)
Creatinine, Ser: 2.92 mg/dL — ABNORMAL HIGH (ref 0.76–1.27)
GFR, EST AFRICAN AMERICAN: 24 mL/min/{1.73_m2} — AB (ref >59)
GFR, EST NON AFRICAN AMERICAN: 20 mL/min/{1.73_m2} — AB (ref >59)
GLOBULIN, TOTAL: 2.2 g/dL (ref 1.5–4.5)
Glucose: 110 mg/dL — ABNORMAL HIGH (ref 65–99)
POTASSIUM: 4.7 mmol/L (ref 3.5–5.2)
SODIUM: 145 mmol/L — AB (ref 134–144)
Total Protein: 6 g/dL (ref 6.0–8.5)

## 2013-04-11 LAB — CBC WITH DIFFERENTIAL/PLATELET
BASOS: 1 %
Basophils Absolute: 0.1 10*3/uL (ref 0.0–0.2)
Eos: 2 %
Eosinophils Absolute: 0.1 10*3/uL (ref 0.0–0.4)
HCT: 37.1 % — ABNORMAL LOW (ref 37.5–51.0)
Hemoglobin: 12.1 g/dL — ABNORMAL LOW (ref 12.6–17.7)
IMMATURE GRANULOCYTES: 0 %
Immature Grans (Abs): 0 10*3/uL (ref 0.0–0.1)
Lymphocytes Absolute: 2.1 10*3/uL (ref 0.7–3.1)
Lymphs: 36 %
MCH: 29.2 pg (ref 26.6–33.0)
MCHC: 32.6 g/dL (ref 31.5–35.7)
MCV: 90 fL (ref 79–97)
MONOCYTES: 5 %
MONOS ABS: 0.3 10*3/uL (ref 0.1–0.9)
NEUTROS PCT: 56 %
Neutrophils Absolute: 3.3 10*3/uL (ref 1.4–7.0)
RBC: 4.14 x10E6/uL (ref 4.14–5.80)
RDW: 14.8 % (ref 12.3–15.4)
WBC: 6 10*3/uL (ref 3.4–10.8)

## 2013-04-11 LAB — HEMOGLOBIN A1C
Est. average glucose Bld gHb Est-mCnc: 223 mg/dL
HEMOGLOBIN A1C: 9.4 % — AB (ref 4.8–5.6)

## 2013-04-11 LAB — MICROALBUMIN / CREATININE URINE RATIO
CREATININE UR: 76.1 mg/dL (ref 22.0–328.0)
MICROALB/CREAT RATIO: 4103.2 mg/g creat — ABNORMAL HIGH (ref 0.0–30.0)
Microalbumin, Urine: 3122.5 ug/mL — ABNORMAL HIGH (ref 0.0–17.0)

## 2013-04-12 ENCOUNTER — Telehealth: Payer: Self-pay

## 2013-04-12 NOTE — Telephone Encounter (Signed)
Discussed lab results with [name] (patient's wife), scheduled appointment to see Tye Maryland on Monday 04/23/13, medication list updated to reflect Levemir change. Copy of labs mailed to patient

## 2013-04-12 NOTE — Telephone Encounter (Signed)
Message copied by Logan Bores on Thu Apr 12, 2013  3:38 PM ------      Message from: Blanchie Serve      Created: Wed Apr 11, 2013  6:50 PM       Your cholesterol level has slightly improved. Your diabetes has worsened. I will need to increase your levemir to 30 u and have you see our diabetic pharm d Oretha Ellis for diabetic management in 1 month. Bring your glucometer with you. Your liver function is abnormal but improved from before. Will monitor this clinically ------

## 2013-04-16 ENCOUNTER — Ambulatory Visit: Payer: Medicare Other | Admitting: Physical Therapy

## 2013-04-16 DIAGNOSIS — Z4789 Encounter for other orthopedic aftercare: Secondary | ICD-10-CM | POA: Diagnosis not present

## 2013-04-16 DIAGNOSIS — R269 Unspecified abnormalities of gait and mobility: Secondary | ICD-10-CM | POA: Diagnosis not present

## 2013-04-16 DIAGNOSIS — R5381 Other malaise: Secondary | ICD-10-CM | POA: Diagnosis not present

## 2013-04-16 DIAGNOSIS — M6281 Muscle weakness (generalized): Secondary | ICD-10-CM | POA: Diagnosis not present

## 2013-04-18 ENCOUNTER — Ambulatory Visit: Payer: Medicare Other | Admitting: Physical Therapy

## 2013-04-20 ENCOUNTER — Emergency Department (HOSPITAL_COMMUNITY): Payer: Medicare Other

## 2013-04-20 ENCOUNTER — Emergency Department (HOSPITAL_COMMUNITY)
Admission: EM | Admit: 2013-04-20 | Discharge: 2013-04-20 | Disposition: A | Discharge status: Home / Self Care | Payer: Medicare Other | Attending: Emergency Medicine | Admitting: Emergency Medicine

## 2013-04-20 ENCOUNTER — Encounter (HOSPITAL_COMMUNITY): Payer: Self-pay | Admitting: Emergency Medicine

## 2013-04-20 DIAGNOSIS — Z7901 Long term (current) use of anticoagulants: Secondary | ICD-10-CM | POA: Insufficient documentation

## 2013-04-20 DIAGNOSIS — N189 Chronic kidney disease, unspecified: Secondary | ICD-10-CM | POA: Insufficient documentation

## 2013-04-20 DIAGNOSIS — E1149 Type 2 diabetes mellitus with other diabetic neurological complication: Secondary | ICD-10-CM | POA: Insufficient documentation

## 2013-04-20 DIAGNOSIS — E785 Hyperlipidemia, unspecified: Secondary | ICD-10-CM | POA: Diagnosis not present

## 2013-04-20 DIAGNOSIS — Z87891 Personal history of nicotine dependence: Secondary | ICD-10-CM | POA: Diagnosis not present

## 2013-04-20 DIAGNOSIS — I2510 Atherosclerotic heart disease of native coronary artery without angina pectoris: Secondary | ICD-10-CM | POA: Insufficient documentation

## 2013-04-20 DIAGNOSIS — Z951 Presence of aortocoronary bypass graft: Secondary | ICD-10-CM | POA: Diagnosis not present

## 2013-04-20 DIAGNOSIS — E1142 Type 2 diabetes mellitus with diabetic polyneuropathy: Secondary | ICD-10-CM | POA: Diagnosis not present

## 2013-04-20 DIAGNOSIS — Z794 Long term (current) use of insulin: Secondary | ICD-10-CM | POA: Diagnosis not present

## 2013-04-20 DIAGNOSIS — M5030 Other cervical disc degeneration, unspecified cervical region: Secondary | ICD-10-CM | POA: Diagnosis not present

## 2013-04-20 DIAGNOSIS — Z860100 Personal history of colon polyps, unspecified: Secondary | ICD-10-CM | POA: Insufficient documentation

## 2013-04-20 DIAGNOSIS — I10 Essential (primary) hypertension: Secondary | ICD-10-CM | POA: Diagnosis not present

## 2013-04-20 DIAGNOSIS — Z86718 Personal history of other venous thrombosis and embolism: Secondary | ICD-10-CM | POA: Insufficient documentation

## 2013-04-20 DIAGNOSIS — K5900 Constipation, unspecified: Secondary | ICD-10-CM | POA: Insufficient documentation

## 2013-04-20 DIAGNOSIS — R630 Anorexia: Secondary | ICD-10-CM | POA: Diagnosis not present

## 2013-04-20 DIAGNOSIS — I252 Old myocardial infarction: Secondary | ICD-10-CM | POA: Insufficient documentation

## 2013-04-20 DIAGNOSIS — Z8546 Personal history of malignant neoplasm of prostate: Secondary | ICD-10-CM | POA: Insufficient documentation

## 2013-04-20 DIAGNOSIS — Z87442 Personal history of urinary calculi: Secondary | ICD-10-CM | POA: Diagnosis not present

## 2013-04-20 DIAGNOSIS — Z79899 Other long term (current) drug therapy: Secondary | ICD-10-CM | POA: Diagnosis not present

## 2013-04-20 DIAGNOSIS — M542 Cervicalgia: Secondary | ICD-10-CM | POA: Diagnosis not present

## 2013-04-20 DIAGNOSIS — R109 Unspecified abdominal pain: Secondary | ICD-10-CM | POA: Insufficient documentation

## 2013-04-20 DIAGNOSIS — Z7982 Long term (current) use of aspirin: Secondary | ICD-10-CM | POA: Insufficient documentation

## 2013-04-20 DIAGNOSIS — S88119A Complete traumatic amputation at level between knee and ankle, unspecified lower leg, initial encounter: Secondary | ICD-10-CM | POA: Diagnosis not present

## 2013-04-20 DIAGNOSIS — Z7902 Long term (current) use of antithrombotics/antiplatelets: Secondary | ICD-10-CM | POA: Diagnosis not present

## 2013-04-20 DIAGNOSIS — M47812 Spondylosis without myelopathy or radiculopathy, cervical region: Secondary | ICD-10-CM | POA: Diagnosis not present

## 2013-04-20 DIAGNOSIS — Z862 Personal history of diseases of the blood and blood-forming organs and certain disorders involving the immune mechanism: Secondary | ICD-10-CM | POA: Insufficient documentation

## 2013-04-20 DIAGNOSIS — I129 Hypertensive chronic kidney disease with stage 1 through stage 4 chronic kidney disease, or unspecified chronic kidney disease: Secondary | ICD-10-CM | POA: Diagnosis not present

## 2013-04-20 DIAGNOSIS — N19 Unspecified kidney failure: Secondary | ICD-10-CM

## 2013-04-20 DIAGNOSIS — N179 Acute kidney failure, unspecified: Secondary | ICD-10-CM | POA: Diagnosis not present

## 2013-04-20 LAB — COMPREHENSIVE METABOLIC PANEL
ALT: 13 U/L (ref 0–53)
AST: 16 U/L (ref 0–37)
Albumin: 3.1 g/dL — ABNORMAL LOW (ref 3.5–5.2)
Alkaline Phosphatase: 128 U/L — ABNORMAL HIGH (ref 39–117)
BUN: 39 mg/dL — ABNORMAL HIGH (ref 6–23)
CALCIUM: 9.1 mg/dL (ref 8.4–10.5)
CO2: 22 mEq/L (ref 19–32)
Chloride: 104 mEq/L (ref 96–112)
Creatinine, Ser: 3.09 mg/dL — ABNORMAL HIGH (ref 0.50–1.35)
GFR calc non Af Amer: 19 mL/min — ABNORMAL LOW (ref >90)
GFR, EST AFRICAN AMERICAN: 21 mL/min — AB (ref >90)
Glucose, Bld: 172 mg/dL — ABNORMAL HIGH (ref 70–99)
Potassium: 4.8 mEq/L (ref 3.7–5.3)
Sodium: 137 mEq/L (ref 137–147)
Total Bilirubin: <0.2 mg/dL — ABNORMAL LOW (ref 0.3–1.2)
Total Protein: 6.6 g/dL (ref 6.0–8.3)

## 2013-04-20 LAB — URINALYSIS, ROUTINE W REFLEX MICROSCOPIC
Bilirubin Urine: NEGATIVE
Glucose, UA: 250 mg/dL — AB
Ketones, ur: NEGATIVE mg/dL
LEUKOCYTES UA: NEGATIVE
NITRITE: NEGATIVE
Specific Gravity, Urine: 1.022 (ref 1.005–1.030)
UROBILINOGEN UA: 1 mg/dL (ref 0.0–1.0)
pH: 5.5 (ref 5.0–8.0)

## 2013-04-20 LAB — I-STAT TROPONIN, ED: Troponin i, poc: 0.04 ng/mL (ref 0.00–0.08)

## 2013-04-20 LAB — CBC WITH DIFFERENTIAL/PLATELET
BASOS ABS: 0 10*3/uL (ref 0.0–0.1)
Basophils Relative: 1 % (ref 0–1)
EOS PCT: 2 % (ref 0–5)
Eosinophils Absolute: 0.2 10*3/uL (ref 0.0–0.7)
HCT: 35.9 % — ABNORMAL LOW (ref 39.0–52.0)
Hemoglobin: 12.3 g/dL — ABNORMAL LOW (ref 13.0–17.0)
LYMPHS PCT: 25 % (ref 12–46)
Lymphs Abs: 2 10*3/uL (ref 0.7–4.0)
MCH: 30 pg (ref 26.0–34.0)
MCHC: 34.3 g/dL (ref 30.0–36.0)
MCV: 87.6 fL (ref 78.0–100.0)
MONO ABS: 0.6 10*3/uL (ref 0.1–1.0)
Monocytes Relative: 7 % (ref 3–12)
Neutro Abs: 5.4 10*3/uL (ref 1.7–7.7)
Neutrophils Relative %: 66 % (ref 43–77)
Platelets: 224 10*3/uL (ref 150–400)
RBC: 4.1 MIL/uL — ABNORMAL LOW (ref 4.22–5.81)
RDW: 13.5 % (ref 11.5–15.5)
WBC: 8.2 10*3/uL (ref 4.0–10.5)

## 2013-04-20 LAB — URINE MICROSCOPIC-ADD ON

## 2013-04-20 LAB — CBG MONITORING, ED: GLUCOSE-CAPILLARY: 151 mg/dL — AB (ref 70–99)

## 2013-04-20 MED ORDER — METOPROLOL TARTRATE 25 MG PO TABS
25.0000 mg | ORAL_TABLET | Freq: Once | ORAL | Status: AC
  Administered 2013-04-20: 25 mg via ORAL
  Filled 2013-04-20: qty 1

## 2013-04-20 MED ORDER — HYDROCODONE-ACETAMINOPHEN 5-325 MG PO TABS
2.0000 | ORAL_TABLET | Freq: Once | ORAL | Status: AC
  Administered 2013-04-20: 2 via ORAL
  Filled 2013-04-20: qty 2

## 2013-04-20 MED ORDER — HYDROCODONE-ACETAMINOPHEN 5-325 MG PO TABS: 1.0000 | ORAL_TABLET | Freq: Four times a day (QID) | ORAL | Status: DC | PRN

## 2013-04-20 NOTE — ED Provider Notes (Signed)
PATIENT AND WIFE CONCERNED ABOUT HIS ELEVATED BLOOD PRESSURE AT DISCHARGE THEY REPORT HE IS ALWAYS AROUND 140/80 PREVIOUS OFFICE VISITS REVEALS BP 164/86 PREVIOUS OFFICE VISIT BEFORE THAT REVEALED 170/80 HE IS WELL APPEARING, NO DISTRESS, WATCHING TV, DENIES CP/SOB.  NO HA REPORTED.  NO FOCAL WEAKNESS NOTED.  HE HAS NO SIGNS OF HYPERTENSIVE EMERGENCY HE IS MAXED OUT ON NORVASC HE CAN INCREASE HIS TOPROL ONE EXTRA DOSE OF METOPROLOL ORDERED HERE AND ADVISED TO TAKE 50MG  TOPROL XL AT HOME I ADVISED THAT ABRUPT LOWERING BP IN ASYMPTOMATIC HTN CAN HAVE NEGATIVE EFFECTS I ADVISED TO F/U WITH PCP NEXT WEEK AND HAVE BP RECHECK AT THAT TIME EPIC NOTE WILL BE SENT TO PCP  Sharyon Cable, MD 04/20/13 1816

## 2013-04-20 NOTE — Discharge Instructions (Signed)
You have neck pain, possibly from a cervical strain and/or pinched nerve.   SEEK IMMEDIATE MEDICAL ATTENTION IF: You develop difficulties swallowing or breathing.  You have new or worse numbness, weakness, tingling, or movement problems in your arms or legs.  You develop increasing pain which is uncontrolled with medications.  You have change in bowel or bladder function, or other concerns.   PLEASE FOLLOWUP WITH PRIMARY DOCTOR IN ONE WEEK FOR RECHECK OF YOUR NECK PAIN AND YOUR KIDNEY FUNCTION

## 2013-04-20 NOTE — ED Notes (Signed)
Pt's family member expressing concern over pt's elevated BP. Dr Christy Gentles aware.

## 2013-04-20 NOTE — ED Notes (Signed)
Pt c/o right sided neck pain radiating to right shoulder x 2-3 wks; off and on; worse last night; c/o abd pain and no appetite x 2 days

## 2013-04-20 NOTE — ED Notes (Signed)
Pt denies abdominal pain at this time, states only pain is to R neck and shoulder

## 2013-04-20 NOTE — ED Provider Notes (Signed)
CSN: [id number]     Arrival date & time 04/20/13  1517 History   First MD Initiated Contact with Patient 04/20/13 1555     Chief Complaint  Patient presents with  . Neck Pain  . Abdominal Pain     Patient is a 73 y.o. male presenting with neck pain. The history is provided by the patient and the spouse.  Neck Pain Pain location:  R side Quality:  Aching Pain radiates to:  R shoulder Pain severity:  Moderate Onset quality:  Gradual Duration:  1 week Timing:  Constant Progression:  Worsening Chronicity:  New Relieved by: rest. Worsened by:  Position (palpation of neck) Associated symptoms: no chest pain, no fever, no headaches, no numbness, no paresis, no tingling and no weakness   Risk factors: no hx of spinal trauma and no recent head injury   pt reports he has right sided neck pain that radiates into right shoulder No arm weakness/numbness No HA or visual/hearing changes No CP/SOB No h/o cervical spine surgery No falls/trauma   He also reports decreased appetite for similar amt of time.  He reports minimal abd pain but none at this time He is s/p bilateral LE BKA, no acute issues.  He was on lyrica for leg pain but has not been on for several weeks.    Past Medical History  Diagnosis Date  . Prostate cancer 2010  . PAD (peripheral artery disease)   . Gangrene of toe     dry  . Neuromuscular disorder     diabetic neuropathy  . Hypertension     takes Amlodipine daily and Metoprolol bid  . Hyperlipidemia     takes Pravastatin daily  . Myocardial infarction 2005  . Peripheral neuropathy   . Constipation     takes Miralax daily  . Hemorrhoids   . Hx of colonic polyps   . History of kidney stones   . Diabetes mellitus     Novolog and Levemir daily  . Coronary artery disease   . BPH (benign prostatic hypertrophy)   . Abnormality of gait   . Coronary atherosclerosis of native coronary artery   . Phlebitis and thrombophlebitis of other deep vessels of lower  extremities   . Chronic kidney disease   . Hypertrophy of prostate without urinary obstruction and other lower urinary tract symptoms (LUTS)   . Disorder of bone and cartilage, unspecified   . Lower limb amputation, below knee   . Long term (current) use of anticoagulants   . Anemia 06/19/2012   Past Surgical History  Procedure Laterality Date  . Prostate surgery  2009    prostatectomy  . Knee cartilage surgery  at age 63    left knee  . Cea      Right  . Coronary artery bypass graft  2005    x 4  . Carotid endarterectomy  2005    right  . Cataract surgery  2011    bilateral  . Colonoscopy    . Cardiac catheterization  05/28/11  . Amputation  06/14/2011    Procedure: AMPUTATION DIGIT;  Surgeon: Elam Dutch, MD;  Location: [facility] OR;  Service: Vascular;  Laterality: Left;  Amputation Left fifth toe  . Amputation  06/16/2011    Procedure: AMPUTATION BELOW KNEE;  Surgeon: Elam Dutch, MD;  Location: [place];  Service: Vascular;  Laterality: Left;  . I&d extremity  01/31/2012    Procedure: IRRIGATION AND DEBRIDEMENT EXTREMITY;  Surgeon: Elam Dutch, MD;  Location: [facility] OR;  Service: Vascular;  Laterality: Left;  I & D Left BKA   . Amputation Right 06/12/2012    Procedure: AMPUTATION DIGIT;  Surgeon: Elam Dutch, MD;  Location: [facility] OR;  Service: Vascular;  Laterality: Right;  GREAT TOE  . Below knee leg amputation Right 08/08/2012    Dr Oneida Alar  . Amputation Right 08/08/2012    Procedure: AMPUTATION BELOW KNEE;  Surgeon: Elam Dutch, MD;  Location: [facility] OR;  Service: Vascular;  Laterality: Right;   Family History  Problem Relation Age of Onset  . Coronary artery disease Neg Hx   . Anesthesia problems Neg Hx   . Hypotension Neg Hx   . Malignant hyperthermia Neg Hx   . Pseudochol deficiency Neg Hx   . Hyperlipidemia Mother   . Hypertension Mother   . Cancer Mother   . Hyperlipidemia Father   . Hypertension Father   . Kidney disease Father   . Heart disease Sister   .  Alzheimer's disease Sister   . Cancer Brother   . Other Sister    History  Substance Use Topics  . Smoking status: Former Smoker -- 1.00 packs/day for 40 years    Types: Cigarettes    Quit date: 04/28/1991  . Smokeless tobacco: Never Used     Comment: occ alcohol  . Alcohol Use: Yes     Comment: occasional    Review of Systems  Constitutional: Positive for appetite change. Negative for fever.  Respiratory: Negative for shortness of breath.   Cardiovascular: Negative for chest pain.  Gastrointestinal: Negative for vomiting.  Genitourinary: Negative for dysuria.  Musculoskeletal: Positive for neck pain.  Neurological: Negative for tingling, weakness, numbness and headaches.  All other systems reviewed and are negative.     Allergies  Oxycodone  Home Medications   Current Outpatient Rx  Name  Route  Sig  Dispense  Refill  . acetaminophen (TYLENOL) 325 MG tablet   Oral   Take 325 mg by mouth every 6 (six) hours as needed for pain or fever.         . AMBULATORY NON FORMULARY MEDICATION      One Touch Ultra 2 Test Strips Sig:  Check blood sugars Three times daily to keep blood sugars under control Dx: 250.00, 401.9   100 strip   11   . AMBULATORY NON FORMULARY MEDICATION      BD U/P Mini Pen Neddles 31GX5MM Use three times daily as directed DX:250.00   100 each   11   . amLODipine (NORVASC) 10 MG tablet   Oral   Take 1 tablet (10 mg total) by mouth daily.   90 tablet   3   . aspirin EC 81 MG tablet   Oral   Take 1 tablet (81 mg total) by mouth every morning.         . clopidogrel (PLAVIX) 75 MG tablet   Oral   Take 1 tablet (75 mg total) by mouth daily.   90 tablet   1   . doxazosin (CARDURA) 1 MG tablet   Oral   Take 1 tablet (1 mg total) by mouth at bedtime.   90 tablet   1   . fentaNYL (DURAGESIC) 25 MCG/HR patch   Transdermal   Place 1 patch (25 mcg total) onto the skin every 3 (three) days.   10 patch   0   . insulin aspart  (NOVOLOG FLEXPEN) 100 UNIT/ML SOPN FlexPen  Inject 5 units if blood sugar greater than 150, DX: 250.71   3 mL   3   . Insulin Detemir (LEVEMIR) 100 UNIT/ML Pen   Subcutaneous   Inject 30 Units into the skin at bedtime.         . metoprolol succinate (TOPROL-XL) 25 MG 24 hr tablet   Oral   Take 1 tablet (25 mg total) by mouth daily.   90 tablet   1   . pantoprazole (PROTONIX) 40 MG tablet   Oral   Take 1 tablet (40 mg total) by mouth daily.   90 tablet   1   . polyethylene glycol powder (GLYCOLAX/MIRALAX) powder   Oral   Take 17 g by mouth daily.   3350 g   1   . pravastatin (PRAVACHOL) 20 MG tablet   Oral   Take 1 tablet (20 mg total) by mouth daily.   90 tablet   3   . pregabalin (LYRICA) 75 MG capsule   Oral   Take 1 capsule (75 mg total) by mouth 2 (two) times daily.   180 capsule   0    BP 200/91  Pulse 75  Temp(Src) 98 F (36.7 C)  Resp 20  SpO2 96% Physical Exam CONSTITUTIONAL: Well developed/well nourished HEAD: Normocephalic/atraumatic EYES: EOMI/PERRL ENMT: Mucous membranes moist NECK: supple no meningeal signs, no bruits SPINE:entire spine nontender, cervical paraspinal tenderness.  No bruising/crepitance/stepoffs noted to spine CV: S1/S2 noted, no murmurs/rubs/gallops noted LUNGS: Lungs are clear to auscultation bilaterally, no apparent distress ABDOMEN: soft, nontender, no rebound or guarding GU:no cva tenderness NEURO: Pt is awake/alert Equal power (5/5) with hand grip, wrist flex/extension, elbow flex/extension, and equal power with shoulder abduction/adduction.  No focal sensory deficit to light touch is noted in either UE.   Equal (2+) biceps/brachioradialis/tricep reflex in bilateral UE EXTREMITIES: pulses normal, full ROM Bilateral BKA noted SKIN: warm, color normal PSYCH: no abnormalities of mood noted  ED Course  Procedures   4:42 PM For neck pain likely MSK pain.  No signs of acute neurologic emergency/myelopathy.  Will  obtain C-spine plain film We discussed screening labs for his appetite change.  Otherwise his abdominal exam is unremarkable He reports he can tolerate oral vicodin 5:49 PM PT FEELS IMPROVED WE DISCUSSED LABS/IMAGING, HE HAS SLIGHT WORSENING OF RENAL FUNCTION, WILL NEED F/U HIS BP IS ELEVATED BUT HE HAS NO SIGNS OF ACUTE CVA OR ACUTE MI.  HE IS WELL APPEARING/COMFORTABLE.  STABLE FOR D/C HOME PT/FAMILY COMFORTABLE WITH PLAN Labs Review Labs Reviewed  CBC WITH DIFFERENTIAL - Abnormal; Notable for the following:    RBC 4.10 (*)    Hemoglobin 12.3 (*)    HCT 35.9 (*)    All other components within normal limits  CBG MONITORING, ED - Abnormal; Notable for the following:    Glucose-Capillary 151 (*)    All other components within normal limits  COMPREHENSIVE METABOLIC PANEL  URINALYSIS, ROUTINE W REFLEX MICROSCOPIC  I-STAT TROPOININ, ED   Imaging Review Dg Cervical Spine Complete  04/20/2013   CLINICAL DATA:  Two week history of right-sided neck and shoulder discomfort  EXAM: CERVICAL SPINE  4+ VIEWS  COMPARISON:  None.  FINDINGS: The cervical vertebral bodies are preserved in height. There is mild disc space narrowing at the C5-6 and C6-7 disc levels. The prevertebral soft tissue spaces appear normal. The oblique views reveal mild bony encroachment upon the neural foramina bilaterally in the mid cervical spine. The odontoid is intact and lateral masses  of C1 align normally with those of C2. There are calcifications in the region of the carotid arteries bilaterally.  IMPRESSION: There is no evidence of an acute compression fracture nor other acute bony abnormality of the cervical spine. There is disc space narrowing at C5-6 and C6-7 with mild facet joint hypertrophy and neural foraminal encroachment bilaterally in the mid and lower cervical spine. If the patient's symptoms persist, elective MRI in of the cervical spine may be of value if the patient can tolerate the procedure.   Electronically  Signed   By: David  Martinique   On: 04/20/2013 17:19     EKG Interpretation   Date/Time:  Friday April 20 2013 15:48:44 EDT Ventricular Rate:  74 PR Interval:  166 QRS Duration: 81 QT Interval:  366 QTC Calculation: 406 R Axis:   -11 Text Interpretation:  Sinus rhythm Borderline T abnormalities, inferior  leads Borderline ST elevation, anterior leads No significant change since  last tracing Confirmed by Christy Gentles  MD, Elenore Rota ([id number]) on 04/20/2013  3:54:08 PM      MDM   Final diagnoses:  Neck pain  Renal failure  Lack of appetite  HTN (hypertension)    Nursing notes including past medical history and social history reviewed and considered in documentation xrays reviewed and considered Labs/vital reviewed and considered     Sharyon Cable, MD 04/20/13 1750

## 2013-04-23 ENCOUNTER — Emergency Department (HOSPITAL_COMMUNITY): Payer: Medicare Other

## 2013-04-23 ENCOUNTER — Encounter (HOSPITAL_COMMUNITY): Payer: Self-pay | Admitting: Emergency Medicine

## 2013-04-23 ENCOUNTER — Encounter: Payer: Medicare Other | Admitting: Physical Therapy

## 2013-04-23 ENCOUNTER — Emergency Department (HOSPITAL_COMMUNITY)
Admission: EM | Admit: 2013-04-23 | Discharge: 2013-04-23 | Disposition: A | Discharge status: Home / Self Care | Payer: Medicare Other | Attending: Emergency Medicine | Admitting: Emergency Medicine

## 2013-04-23 ENCOUNTER — Ambulatory Visit: Payer: Medicare Other | Admitting: Pharmacotherapy

## 2013-04-23 DIAGNOSIS — Z87442 Personal history of urinary calculi: Secondary | ICD-10-CM | POA: Insufficient documentation

## 2013-04-23 DIAGNOSIS — I129 Hypertensive chronic kidney disease with stage 1 through stage 4 chronic kidney disease, or unspecified chronic kidney disease: Secondary | ICD-10-CM | POA: Diagnosis not present

## 2013-04-23 DIAGNOSIS — Z7902 Long term (current) use of antithrombotics/antiplatelets: Secondary | ICD-10-CM | POA: Insufficient documentation

## 2013-04-23 DIAGNOSIS — Z794 Long term (current) use of insulin: Secondary | ICD-10-CM | POA: Insufficient documentation

## 2013-04-23 DIAGNOSIS — K5900 Constipation, unspecified: Secondary | ICD-10-CM | POA: Insufficient documentation

## 2013-04-23 DIAGNOSIS — N400 Enlarged prostate without lower urinary tract symptoms: Secondary | ICD-10-CM | POA: Insufficient documentation

## 2013-04-23 DIAGNOSIS — I252 Old myocardial infarction: Secondary | ICD-10-CM | POA: Diagnosis not present

## 2013-04-23 DIAGNOSIS — G8929 Other chronic pain: Secondary | ICD-10-CM | POA: Diagnosis not present

## 2013-04-23 DIAGNOSIS — Z79899 Other long term (current) drug therapy: Secondary | ICD-10-CM | POA: Diagnosis not present

## 2013-04-23 DIAGNOSIS — Z862 Personal history of diseases of the blood and blood-forming organs and certain disorders involving the immune mechanism: Secondary | ICD-10-CM | POA: Diagnosis not present

## 2013-04-23 DIAGNOSIS — Z7982 Long term (current) use of aspirin: Secondary | ICD-10-CM | POA: Diagnosis not present

## 2013-04-23 DIAGNOSIS — Z87891 Personal history of nicotine dependence: Secondary | ICD-10-CM | POA: Insufficient documentation

## 2013-04-23 DIAGNOSIS — Z8669 Personal history of other diseases of the nervous system and sense organs: Secondary | ICD-10-CM | POA: Insufficient documentation

## 2013-04-23 DIAGNOSIS — I10 Essential (primary) hypertension: Secondary | ICD-10-CM | POA: Diagnosis not present

## 2013-04-23 DIAGNOSIS — E119 Type 2 diabetes mellitus without complications: Secondary | ICD-10-CM | POA: Diagnosis not present

## 2013-04-23 DIAGNOSIS — Z8546 Personal history of malignant neoplasm of prostate: Secondary | ICD-10-CM | POA: Insufficient documentation

## 2013-04-23 DIAGNOSIS — Z860100 Personal history of colon polyps, unspecified: Secondary | ICD-10-CM | POA: Insufficient documentation

## 2013-04-23 DIAGNOSIS — R0602 Shortness of breath: Secondary | ICD-10-CM | POA: Diagnosis not present

## 2013-04-23 DIAGNOSIS — I2510 Atherosclerotic heart disease of native coronary artery without angina pectoris: Secondary | ICD-10-CM | POA: Insufficient documentation

## 2013-04-23 DIAGNOSIS — N189 Chronic kidney disease, unspecified: Secondary | ICD-10-CM | POA: Diagnosis not present

## 2013-04-23 DIAGNOSIS — E785 Hyperlipidemia, unspecified: Secondary | ICD-10-CM | POA: Insufficient documentation

## 2013-04-23 DIAGNOSIS — Z951 Presence of aortocoronary bypass graft: Secondary | ICD-10-CM | POA: Diagnosis not present

## 2013-04-23 DIAGNOSIS — I160 Hypertensive urgency: Secondary | ICD-10-CM

## 2013-04-23 DIAGNOSIS — Z9889 Other specified postprocedural states: Secondary | ICD-10-CM | POA: Diagnosis not present

## 2013-04-23 LAB — CBC
HEMATOCRIT: 40.2 % (ref 39.0–52.0)
Hemoglobin: 13.8 g/dL (ref 13.0–17.0)
MCH: 30.5 pg (ref 26.0–34.0)
MCHC: 34.3 g/dL (ref 30.0–36.0)
MCV: 88.7 fL (ref 78.0–100.0)
PLATELETS: 232 10*3/uL (ref 150–400)
RBC: 4.53 MIL/uL (ref 4.22–5.81)
RDW: 13.6 % (ref 11.5–15.5)
WBC: 7.9 10*3/uL (ref 4.0–10.5)

## 2013-04-23 LAB — I-STAT TROPONIN, ED: TROPONIN I, POC: 0.04 ng/mL (ref 0.00–0.08)

## 2013-04-23 LAB — BASIC METABOLIC PANEL
BUN: 38 mg/dL — AB (ref 6–23)
CALCIUM: 9.8 mg/dL (ref 8.4–10.5)
CO2: 20 mEq/L (ref 19–32)
CREATININE: 3.17 mg/dL — AB (ref 0.50–1.35)
Chloride: 102 mEq/L (ref 96–112)
GFR calc Af Amer: 21 mL/min — ABNORMAL LOW (ref >90)
GFR calc non Af Amer: 18 mL/min — ABNORMAL LOW (ref >90)
Glucose, Bld: 218 mg/dL — ABNORMAL HIGH (ref 70–99)
Potassium: 5.2 mEq/L (ref 3.7–5.3)
Sodium: 138 mEq/L (ref 137–147)

## 2013-04-23 MED ORDER — HYDRALAZINE HCL 10 MG PO TABS: ORAL_TABLET | ORAL | Status: DC

## 2013-04-23 MED ORDER — HYDRALAZINE HCL 20 MG/ML IJ SOLN
10.0000 mg | Freq: Once | INTRAMUSCULAR | Status: AC
  Administered 2013-04-23: 10 mg via INTRAVENOUS
  Filled 2013-04-23: qty 1

## 2013-04-23 NOTE — ED Provider Notes (Signed)
CSN: [id number]     Arrival date & time 04/23/13  1341 History   First MD Initiated Contact with Patient 04/23/13 1510     Chief Complaint  Patient presents with  . Hypertension     (Consider location/radiation/quality/duration/timing/severity/associated sxs/prior Treatment) HPI Comments: Patient is a 73 year old male with an extensive past medical history including CAD, hypertension, MI, diabetes, CKD who presents to the emergency department with his wife complaining of continued hypertension since being seen at [facility] on 04/20/13 for the same. He had labs at that time which did not show any acute concern. He was advised to increase his metoprolol to 50 mg per day which he has been doing. When he checked his blood pressure this morning he noted it to be about 250/100's. Admits to slight shortness of breath on exertion, but states this is not new. Denies chest pain, nausea, vomiting, diarrhea, headache or vision changes. He is also complaining of chronic right shoulder pain due to his bursitis radiating up the right side of his neck intermittently over the past week. States he has had these symptoms "for a long time". He had a plain film of his c-spine on 4/10 which showed no acute findings.  Patient is a 73 y.o. male presenting with hypertension. The history is provided by the patient and the spouse.  Hypertension Associated symptoms include arthralgias.    Past Medical History  Diagnosis Date  . Prostate cancer 2010  . PAD (peripheral artery disease)   . Gangrene of toe     dry  . Neuromuscular disorder     diabetic neuropathy  . Hypertension     takes Amlodipine daily and Metoprolol bid  . Hyperlipidemia     takes Pravastatin daily  . Myocardial infarction 2005  . Peripheral neuropathy   . Constipation     takes Miralax daily  . Hemorrhoids   . Hx of colonic polyps   . History of kidney stones   . Diabetes mellitus     Novolog and Levemir daily  . Coronary artery disease   .  BPH (benign prostatic hypertrophy)   . Abnormality of gait   . Coronary atherosclerosis of native coronary artery   . Phlebitis and thrombophlebitis of other deep vessels of lower extremities   . Chronic kidney disease   . Hypertrophy of prostate without urinary obstruction and other lower urinary tract symptoms (LUTS)   . Disorder of bone and cartilage, unspecified   . Lower limb amputation, below knee   . Long term (current) use of anticoagulants   . Anemia 06/19/2012   Past Surgical History  Procedure Laterality Date  . Prostate surgery  2009    prostatectomy  . Knee cartilage surgery  at age 89    left knee  . Cea      Right  . Coronary artery bypass graft  2005    x 4  . Carotid endarterectomy  2005    right  . Cataract surgery  2011    bilateral  . Colonoscopy    . Cardiac catheterization  05/28/11  . Amputation  06/14/2011    Procedure: AMPUTATION DIGIT;  Surgeon: Elam Dutch, MD;  Location: [facility] OR;  Service: Vascular;  Laterality: Left;  Amputation Left fifth toe  . Amputation  06/16/2011    Procedure: AMPUTATION BELOW KNEE;  Surgeon: Elam Dutch, MD;  Location: [place];  Service: Vascular;  Laterality: Left;  . I&d extremity  01/31/2012    Procedure: DUKGURKYHC WCB [id number]  EXTREMITY;  Surgeon: Elam Dutch, MD;  Location: [facility] OR;  Service: Vascular;  Laterality: Left;  I & D Left BKA   . Amputation Right 06/12/2012    Procedure: AMPUTATION DIGIT;  Surgeon: Elam Dutch, MD;  Location: [facility] OR;  Service: Vascular;  Laterality: Right;  GREAT TOE  . Below knee leg amputation Right 08/08/2012    Dr Oneida Alar  . Amputation Right 08/08/2012    Procedure: AMPUTATION BELOW KNEE;  Surgeon: Elam Dutch, MD;  Location: [facility] OR;  Service: Vascular;  Laterality: Right;   Family History  Problem Relation Age of Onset  . Coronary artery disease Neg Hx   . Anesthesia problems Neg Hx   . Hypotension Neg Hx   . Malignant hyperthermia Neg Hx   . Pseudochol deficiency Neg Hx    . Hyperlipidemia Mother   . Hypertension Mother   . Cancer Mother   . Hyperlipidemia Father   . Hypertension Father   . Kidney disease Father   . Heart disease Sister   . Alzheimer's disease Sister   . Cancer Brother   . Other Sister    History  Substance Use Topics  . Smoking status: Former Smoker -- 1.00 packs/day for 40 years    Types: Cigarettes    Quit date: 04/28/1991  . Smokeless tobacco: Never Used     Comment: occ alcohol  . Alcohol Use: Yes     Comment: occasional    Review of Systems  Respiratory: Positive for shortness of breath.   Musculoskeletal: Positive for arthralgias.  All other systems reviewed and are negative.     Allergies  Oxycodone  Home Medications   Current Outpatient Rx  Name  Route  Sig  Dispense  Refill  . amLODipine (NORVASC) 10 MG tablet   Oral   Take 1 tablet (10 mg total) by mouth daily.   90 tablet   3   . aspirin EC 81 MG tablet   Oral   Take 1 tablet (81 mg total) by mouth every morning.         . clopidogrel (PLAVIX) 75 MG tablet   Oral   Take 1 tablet (75 mg total) by mouth daily.   90 tablet   1   . doxazosin (CARDURA) 1 MG tablet   Oral   Take 1 tablet (1 mg total) by mouth at bedtime.   90 tablet   1   . HYDROcodone-acetaminophen (NORCO/VICODIN) 5-325 MG per tablet   Oral   Take 1 tablet by mouth every 6 (six) hours as needed for moderate pain.         [name] insulin aspart (NOVOLOG FLEXPEN) 100 UNIT/ML SOPN FlexPen      Inject 5 units if blood sugar greater than 150, DX: 250.71   3 mL   3   . Insulin Detemir (LEVEMIR) 100 UNIT/ML Pen   Subcutaneous   Inject 30 Units into the skin at bedtime.         . metoprolol succinate (TOPROL-XL) 25 MG 24 hr tablet   Oral   Take 1 tablet (25 mg total) by mouth daily.   90 tablet   1   . pantoprazole (PROTONIX) 40 MG tablet   Oral   Take 1 tablet (40 mg total) by mouth daily.   90 tablet   1   . polyethylene glycol powder (GLYCOLAX/MIRALAX) powder    Oral   Take 17 g by mouth daily.   3350 g   1   .  pravastatin (PRAVACHOL) 20 MG tablet   Oral   Take 1 tablet (20 mg total) by mouth daily.   90 tablet   3   . AMBULATORY NON FORMULARY MEDICATION      One Touch Ultra 2 Test Strips Sig:  Check blood sugars Three times daily to keep blood sugars under control Dx: 250.00, 401.9   100 strip   11   . AMBULATORY NON FORMULARY MEDICATION      BD U/P Mini Pen [place] 31GX5MM Use three times daily as directed DX:250.00   100 each   11   . hydrALAZINE (APRESOLINE) 10 MG tablet      Take 10 mg by mouth 4 times a day x2 days increasing to 2.5 (25mg ) tablets PO 4 times a day   30 tablet   0    BP 150/70  Pulse 81  Temp(Src) 98.2 F (36.8 C) (Oral)  Resp 16  Ht 5\' 8"  (1.727 m)  Wt 210 lb (95.255 kg)  BMI 31.94 kg/m2  SpO2 100% Physical Exam  Nursing note and vitals reviewed. Constitutional: He is oriented to person, place, and time. He appears well-developed and well-nourished. No distress.  HENT:  Head: Normocephalic and atraumatic.  Mouth/Throat: Oropharynx is clear and moist.  Eyes: Conjunctivae and EOM are normal. Pupils are equal, round, and reactive to light.  Neck: Normal range of motion. Neck supple. No JVD present.  Cardiovascular: Normal rate, regular rhythm, normal heart sounds and intact distal pulses.   Pulmonary/Chest: Effort normal and breath sounds normal. No respiratory distress.  Abdominal: Soft. Bowel sounds are normal. There is no tenderness.  Musculoskeletal: Normal range of motion. He exhibits no edema.  Bilateral below the knee amputations.  Neurological: He is alert and oriented to person, place, and time. He has normal strength. No sensory deficit.  Speech fluent, goal oriented. Moves limbs without ataxia. Equal grip strength bilateral.  Skin: Skin is warm and dry. He is not diaphoretic.  Psychiatric: He has a normal mood and affect. His behavior is normal.    ED Course  Procedures  (including critical care time) Labs Review Labs Reviewed  BASIC METABOLIC PANEL - Abnormal; Notable for the following:    Glucose, Bld 218 (*)    BUN 38 (*)    Creatinine, Ser 3.17 (*)    GFR calc non Af Amer 18 (*)    GFR calc Af Amer 21 (*)    All other components within normal limits  CBC  I-STAT TROPOININ, ED   Imaging Review Dg Chest 2 View  04/23/2013   CLINICAL DATA:  HYPERTENSION  EXAM: CHEST  2 VIEW  COMPARISON:  SP REMOVAL TUN CV CATH W/O FL dated 08/09/2012; DG CHEST 2 VIEW dated 06/18/2012  FINDINGS: Low lung volumes. Cardiac silhouette within the upper limits normal. Patient is status post median sternotomy and coronary artery bypass grafting. Atherosclerotic calcifications identified within aorta. Metallic artifact lateral left hemi thorax. Osseous structures grossly unremarkable.  IMPRESSION: No active cardiopulmonary disease.   Electronically Signed   By: Margaree Mackintosh M.D.   On: 04/23/2013 16:55     EKG Interpretation   Date/Time:  Monday April 23 2013 16:04:27 EDT Ventricular Rate:  79 PR Interval:  154 QRS Duration: 81 QT Interval:  372 QTC Calculation: 426 R Axis:   -34 Text Interpretation:  Sinus rhythm LVH with secondary repolarization  abnormality Anterior Q waves, possibly due to LVH No significant change  since last tracing Confirmed by POLLINA  MD, [name] (  [id number]) on  04/23/2013 4:06:31 PM      MDM   Final diagnoses:  Asymptomatic hypertensive urgency    Patient presenting with hypertension. He is well appearing and in no apparent distress, afebrile. Hypertensive 171/80 throughout arrival, 220/106 when he was placed in the room. Labs pending. EKG unchanged. Will give hydralazine 10 mg. Plan to d/c with hydralazine PO after BP control. Case discussed with attending Dr. Betsey Holiday who also evaluated patient and agrees with plan of care. 5:07 PM Labs showing slightly worsening renal failure, patient states he is aware as he was told this at his last  visit, he will discuss this with his primary care physician. Blood pressure decreased to 150/70 after receiving hydralazine. Patient is stable for discharge and remains asymptomatic. Will start on hydralazine PO. He will followup with his PCP. Return precautions given. Patient states understanding of treatment care plan and is agreeable.    Illene Labrador, PA-C 04/23/13 1708

## 2013-04-23 NOTE — ED Notes (Addendum)
Per pt sts his BP has been increased since Friday. sts some pain in his right shoulder due to bursitis. sts some numbness on the right side of his face and neck that has been intermittent over the past week. sts also pain in that area.

## 2013-04-23 NOTE — ED Notes (Signed)
Md Pollina at bedside.

## 2013-04-23 NOTE — ED Notes (Addendum)
Pt reports ongoing high blood pressure. Recently seen at [facility] for same. Denies any CP, SOB, N/V/D. Pt has ongoing versititis pain to right shoulder, rates 8/10. Neuro intact. Denies HA, blurred vision or changes in vision.

## 2013-04-23 NOTE — Discharge Instructions (Signed)
Take hydralazine as directed and to followup with your primary care physician. Begin taking one tablet by mouth 4 times daily for 2 days followed by 2.5 tablets 4 times daily.  Hypertension As your heart beats, it forces blood through your arteries. This force is your blood pressure. If the pressure is too high, it is called hypertension (HTN) or high blood pressure. HTN is dangerous because you may have it and not know it. High blood pressure may mean that your heart has to work harder to pump blood. Your arteries may be narrow or stiff. The extra work puts you at risk for heart disease, stroke, and other problems.  Blood pressure consists of two numbers, a higher number over a lower, 110/72, for example. It is stated as "110 over 72." The ideal is below 120 for the top number (systolic) and under 80 for the bottom (diastolic). Write down your blood pressure today. You should pay close attention to your blood pressure if you have certain conditions such as:  Heart failure.  Prior heart attack.  Diabetes  Chronic kidney disease.  Prior stroke.  Multiple risk factors for heart disease. To see if you have HTN, your blood pressure should be measured while you are seated with your arm held at the level of the heart. It should be measured at least twice. A one-time elevated blood pressure reading (especially in the Emergency Department) does not mean that you need treatment. There may be conditions in which the blood pressure is different between your right and left arms. It is important to see your caregiver soon for a recheck. Most people have essential hypertension which means that there is not a specific cause. This type of high blood pressure may be lowered by changing lifestyle factors such as:  Stress.  Smoking.  Lack of exercise.  Excessive weight.  Drug/tobacco/alcohol use.  Eating less salt. Most people do not have symptoms from high blood pressure until it has caused damage to  the body. Effective treatment can often prevent, delay or reduce that damage. TREATMENT  When a cause has been identified, treatment for high blood pressure is directed at the cause. There are a large number of medications to treat HTN. These fall into several categories, and your caregiver will help you select the medicines that are best for you. Medications may have side effects. You should review side effects with your caregiver. If your blood pressure stays high after you have made lifestyle changes or started on medicines,   Your medication(s) may need to be changed.  Other problems may need to be addressed.  Be certain you understand your prescriptions, and know how and when to take your medicine.  Be sure to follow up with your caregiver within the time frame advised (usually within two weeks) to have your blood pressure rechecked and to review your medications.  If you are taking more than one medicine to lower your blood pressure, make sure you know how and at what times they should be taken. Taking two medicines at the same time can result in blood pressure that is too low. SEEK IMMEDIATE MEDICAL CARE IF:  You develop a severe headache, blurred or changing vision, or confusion.  You have unusual weakness or numbness, or a faint feeling.  You have severe chest or abdominal pain, vomiting, or breathing problems. MAKE SURE YOU:   Understand these instructions.  Will watch your condition.  Will get help right away if you are not doing well or get  worse. Document Released: 12/28/2004 Document Revised: 03/22/2011 Document Reviewed: 08/18/2007 [facility] Patient Information 2014 [place].

## 2013-04-24 NOTE — ED Provider Notes (Signed)
Medical screening examination/treatment/procedure(s) were conducted as a shared visit with non-physician practitioner(s) and myself.  I personally evaluated the patient during the encounter.   EKG Interpretation   Date/Time:  Monday April 23 2013 16:04:27 EDT Ventricular Rate:  79 PR Interval:  154 QRS Duration: 81 QT Interval:  372 QTC Calculation: 426 R Axis:   -34 Text Interpretation:  Sinus rhythm LVH with secondary repolarization  abnormality Anterior Q waves, possibly due to LVH No significant change  since last tracing Confirmed by POLLINA  MD, [place] [id number]) on  04/23/2013 4:06:31 PM      Patient presents to the ER for persistently elevated blood pressures. He is asymptomatic. Patient was seen recently in the ER for elevated blood pressure, treated and released. His metoprolol was increased. Patient indicates that his blood pressure has been as high as 448 systolic at home. Currently no chest pain, shortness of breath, but her vision or headache. There exam is normal. Patient treated with hydralazine with good improvement here in the ER. Will be prescribed oral hydralazine until he contacts with his primary care doctor for definitive medication management.  Orpah Greek, MD 04/24/13 [phone]

## 2013-04-25 ENCOUNTER — Encounter: Payer: Medicare Other | Admitting: Physical Therapy

## 2013-04-25 ENCOUNTER — Ambulatory Visit: Payer: Self-pay | Admitting: Nurse Practitioner

## 2013-04-25 ENCOUNTER — Telehealth: Payer: Self-pay

## 2013-04-25 MED ORDER — HYDRALAZINE HCL 10 MG PO TABS: ORAL_TABLET | ORAL | Status: DC

## 2013-04-25 NOTE — Telephone Encounter (Signed)
Patient 's wife called stating she was cleaning up and throwing away stuff and by accident she threw away Aaron Long's Hydrocodone 5-325 rx given in the ER and his bottle of Hydralizine 10 mg. Patient's wife would like to know if he can have both of these rx's renewed.  I informed [name] that I can submit rx for hydralizine, rx for Hydrocodone will have to be given by covering MD (Dr.Pandey) is out of office  Side Note: Patient had appointment to f/u with [name] today and cancelled

## 2013-04-25 NOTE — Telephone Encounter (Signed)
Hydrocodone cannot be filled until it is due.

## 2013-04-26 MED ORDER — HYDROCODONE-ACETAMINOPHEN 5-325 MG PO TABS: 1.0000 | ORAL_TABLET | Freq: Four times a day (QID) | ORAL | Status: DC | PRN

## 2013-04-26 NOTE — Telephone Encounter (Signed)
Patient's wife sates she threw away the RX by accident. I called the pharmacy [facility] on [address] and rx was last filled 10/2012. I called [facility] [address] and rx was last filled there August 2014

## 2013-04-26 NOTE — Telephone Encounter (Signed)
Medication list states 1 by mouth every 6 hours as needed, patient's wife states she gives him 2 by mouth every 6 hours as needed. Dr.Reed please advie on instructions and dispense number

## 2013-04-26 NOTE — Telephone Encounter (Signed)
Per Dr.Reed fill rx for 1 by mouth every 6 hours as needed #120, patients wife aware

## 2013-04-26 NOTE — Telephone Encounter (Signed)
I suppose we can renew the hydrocodone then.  He needs to keep his appointments.

## 2013-04-30 ENCOUNTER — Encounter: Payer: Medicare Other | Admitting: Physical Therapy

## 2013-05-02 ENCOUNTER — Encounter: Payer: Medicare Other | Admitting: Physical Therapy

## 2013-05-08 ENCOUNTER — Encounter: Payer: Medicare Other | Admitting: Physical Therapy

## 2013-05-10 ENCOUNTER — Encounter: Payer: Medicare Other | Admitting: Physical Therapy

## 2013-05-24 ENCOUNTER — Other Ambulatory Visit: Payer: Self-pay | Admitting: *Deleted

## 2013-05-24 MED ORDER — HYDROCODONE-ACETAMINOPHEN 5-325 MG PO TABS: 1.0000 | ORAL_TABLET | Freq: Four times a day (QID) | ORAL | Status: DC | PRN

## 2013-05-30 ENCOUNTER — Other Ambulatory Visit: Payer: Self-pay | Admitting: *Deleted

## 2013-05-30 MED ORDER — PREGABALIN 75 MG PO CAPS: ORAL_CAPSULE | ORAL | Status: DC

## 2013-05-30 MED ORDER — INSULIN DETEMIR 100 UNIT/ML FLEXPEN: 30.0000 [IU] | PEN_INJECTOR | Freq: Every day | SUBCUTANEOUS | Status: DC

## 2013-05-30 MED ORDER — INSULIN ASPART 100 UNIT/ML FLEXPEN: PEN_INJECTOR | SUBCUTANEOUS | Status: DC

## 2013-05-30 NOTE — Telephone Encounter (Signed)
[facility] sent paperwork to fill out for patient assistance for Lyrica medication. Paperwork filled out and signed by Dr. Bubba Camp and faxed back. [employer] # [phone] Fax: [phone]) Also received paperwork for patient's Novolog and Levemir. Filled out and printed Rx's and faxed back to Lurline Del with [facility] --Fax# [phone] and # [phone] Dr. Bubba Camp Approved and signed.

## 2013-06-08 ENCOUNTER — Other Ambulatory Visit: Payer: Self-pay | Admitting: Internal Medicine

## 2013-06-08 DIAGNOSIS — N184 Chronic kidney disease, stage 4 (severe): Secondary | ICD-10-CM | POA: Diagnosis not present

## 2013-06-08 DIAGNOSIS — E559 Vitamin D deficiency, unspecified: Secondary | ICD-10-CM | POA: Diagnosis not present

## 2013-06-08 DIAGNOSIS — I10 Essential (primary) hypertension: Secondary | ICD-10-CM | POA: Diagnosis not present

## 2013-06-13 ENCOUNTER — Encounter: Payer: Self-pay | Admitting: Internal Medicine

## 2013-06-13 ENCOUNTER — Ambulatory Visit (INDEPENDENT_AMBULATORY_CARE_PROVIDER_SITE_OTHER): Payer: Medicare Other | Admitting: Internal Medicine

## 2013-06-13 ENCOUNTER — Ambulatory Visit: Payer: Medicare Other | Admitting: Internal Medicine

## 2013-06-13 VITALS — BP 188/82 | HR 72 | Temp 98.2°F | Resp 18

## 2013-06-13 DIAGNOSIS — N184 Chronic kidney disease, stage 4 (severe): Secondary | ICD-10-CM | POA: Diagnosis not present

## 2013-06-13 DIAGNOSIS — K219 Gastro-esophageal reflux disease without esophagitis: Secondary | ICD-10-CM

## 2013-06-13 DIAGNOSIS — K5903 Drug induced constipation: Secondary | ICD-10-CM

## 2013-06-13 DIAGNOSIS — E875 Hyperkalemia: Secondary | ICD-10-CM

## 2013-06-13 DIAGNOSIS — N189 Chronic kidney disease, unspecified: Secondary | ICD-10-CM | POA: Diagnosis not present

## 2013-06-13 DIAGNOSIS — I129 Hypertensive chronic kidney disease with stage 1 through stage 4 chronic kidney disease, or unspecified chronic kidney disease: Secondary | ICD-10-CM | POA: Diagnosis not present

## 2013-06-13 DIAGNOSIS — K5909 Other constipation: Secondary | ICD-10-CM | POA: Diagnosis not present

## 2013-06-13 DIAGNOSIS — I739 Peripheral vascular disease, unspecified: Secondary | ICD-10-CM | POA: Diagnosis not present

## 2013-06-13 DIAGNOSIS — E1129 Type 2 diabetes mellitus with other diabetic kidney complication: Secondary | ICD-10-CM

## 2013-06-13 DIAGNOSIS — I70219 Atherosclerosis of native arteries of extremities with intermittent claudication, unspecified extremity: Secondary | ICD-10-CM | POA: Diagnosis not present

## 2013-06-13 DIAGNOSIS — E1159 Type 2 diabetes mellitus with other circulatory complications: Secondary | ICD-10-CM | POA: Diagnosis not present

## 2013-06-13 DIAGNOSIS — IMO0002 Reserved for concepts with insufficient information to code with codable children: Secondary | ICD-10-CM | POA: Insufficient documentation

## 2013-06-13 DIAGNOSIS — I798 Other disorders of arteries, arterioles and capillaries in diseases classified elsewhere: Secondary | ICD-10-CM | POA: Diagnosis not present

## 2013-06-13 DIAGNOSIS — E1165 Type 2 diabetes mellitus with hyperglycemia: Secondary | ICD-10-CM

## 2013-06-13 DIAGNOSIS — Z5181 Encounter for therapeutic drug level monitoring: Secondary | ICD-10-CM

## 2013-06-13 DIAGNOSIS — T50904A Poisoning by unspecified drugs, medicaments and biological substances, undetermined, initial encounter: Secondary | ICD-10-CM

## 2013-06-13 MED ORDER — PANTOPRAZOLE SODIUM 20 MG PO TBEC: 20.0000 mg | DELAYED_RELEASE_TABLET | Freq: Every day | ORAL | Status: DC

## 2013-06-13 MED ORDER — LINACLOTIDE 145 MCG PO CAPS: 145.0000 ug | ORAL_CAPSULE | Freq: Every day | ORAL | Status: DC

## 2013-06-13 MED ORDER — HYDRALAZINE HCL 25 MG PO TABS: 25.0000 mg | ORAL_TABLET | Freq: Two times a day (BID) | ORAL | Status: DC

## 2013-06-13 NOTE — Progress Notes (Signed)
Patient ID: Aaron Long, male   DOB: 1940-07-29, 73 y.o.   MRN: [id number]    Chief Complaint  Patient presents with  . Follow-up   Allergies  Allergen Reactions  . Oxycodone Other (See Comments)    Hallucinations   HPI 73 y/o male patient with dm type 2, PVD s/p bilateral amputation, hypertension, CAD, CKD is here for follow up. His cbg has been between 100-250 mostly with few reading in 80s and 300s. He is complaint with his levemir and aspart. His mood has been stable. He ran out of hydralazine that was started in the ED. His reflux symptoms have improved. His constipation has been bothering him. He is on norco and has ran out of his lyrica. He is not able to get it until July due to insurance and cost issue. He has BM every 3-4 days and has been straining. bp elevated in the office on arrival. Denies chest pain, dyspnea, headache or abdominal pain  Review of Systems   Constitutional: Negative for diaphoresis, fever or chills HENT: Negative for congestion and sore throat.    Eyes: Negative for blurred vision and double vision.   Respiratory: Negative for cough, shortness of breath and wheezing.    Cardiovascular: Negative for chest pain, palpitation   Gastrointestinal: Negative for heartburn, nausea, vomiting, abdominal pain, diarrhea.   Genitourinary: Negative for dysuria, hematuria, flank pain Musculoskeletal: Negative for myalgias and falls.  has both legs amputated Skin: Negative for rash.   Neurological: Negative for dizziness and headaches.   Psychiatric/Behavioral: Negative for memory loss. The patient is not nervous/anxious. no sleep disturbances  Past Medical History  Diagnosis Date  . Prostate cancer 2010  . PAD (peripheral artery disease)   . Gangrene of toe     dry  . Neuromuscular disorder     diabetic neuropathy  . Hypertension     takes Amlodipine daily and Metoprolol bid  . Hyperlipidemia     takes Pravastatin daily  . Myocardial infarction 2005  .  Peripheral neuropathy   . Constipation     takes Miralax daily  . Hemorrhoids   . Hx of colonic polyps   . History of kidney stones   . Diabetes mellitus     Novolog and Levemir daily  . Coronary artery disease   . BPH (benign prostatic hypertrophy)   . Abnormality of gait   . Coronary atherosclerosis of native coronary artery   . Phlebitis and thrombophlebitis of other deep vessels of lower extremities   . Chronic kidney disease   . Hypertrophy of prostate without urinary obstruction and other lower urinary tract symptoms (LUTS)   . Disorder of bone and cartilage, unspecified   . Lower limb amputation, below knee   . Long term (current) use of anticoagulants   . Anemia 06/19/2012   Medication reviewed. See [facility]  Physical exam BP 200/90  Pulse 72  Temp(Src) 98.2 F (36.8 C) (Oral)  Resp 18  SpO2 99%  General- elderly male in no acute distress Head- atraumatic, normocephalic Eyes- PERRLA, EOMI, no pallor, no icterus Neck- no lymphadenopathy, no thyromegaly, no jugular vein distension, no carotid bruit Chest- no chest wall deformities, no chest wall tenderness Cardiovascular- normal s1,s2, no murmurs/ rubs/ gallops Respiratory- bilateral clear to auscultation, no wheeze, no rhonchi, no crackles Abdomen- bowel sounds present, soft, non tender Musculoskeletal- able to move his upper extremities, no spinal and paraspinal tenderness, has bilateral BKA and uses a wheelchair, has a blister in right inner thigh area,  no signs of infection Neurological- no focal deficit Psychiatry- alert and oriented to person, place and time, normal mood and affect   Lab Results  Component Value Date   HGBA1C 9.4* 04/10/2013   Lab Results  Component Value Date   CREATININE 3.17* 04/23/2013   Lab Results  Component Value Date   K 5.2 04/23/2013   Assessment/plan  1. Hyperkalemia Recheck bmp today  2. Drug-induced constipation Will try linzess 145 mcg once a day for now and reassess in 4  weeks, samples have been provided  3. GERD (gastroesophageal reflux disease) Stable, will decrease pantoprazole to 20 mg daily and reassess  4. CKD (chronic kidney disease) stage 4, GFR 15-29 ml/min In setting of his vascular disease, dm and uncontrolled HTN, avoid NSAIDs, follows with renal  5. DM type 2, uncontrolled, with renal complications Recheck M2U today. Reviewed cbg- improved from before. Continue levemir and aspart current regimen.continue aspirin and pravastatin. Samples on lyrica provided to help with his neuropathic pain. - Hemoglobin Q3F - Basic Metabolic Panel  6. Hypertensive renal disease Elevated bp on repeat check, add hydralazine 25 mg bid for now and recheck bp in office in 4 weeks. Warning signs with elevated bp readings explained and patient voices understanding this - Basic Metabolic Panel  3.L45.62 Genetic testing performed to see efficacy and interaction between medications that patient is currently taking

## 2013-06-14 LAB — BASIC METABOLIC PANEL
BUN/Creatinine Ratio: 13 (ref 10–22)
BUN: 36 mg/dL — ABNORMAL HIGH (ref 8–27)
CALCIUM: 9 mg/dL (ref 8.6–10.2)
CO2: 25 mmol/L (ref 18–29)
CREATININE: 2.84 mg/dL — AB (ref 0.76–1.27)
Chloride: 103 mmol/L (ref 97–108)
GFR, EST AFRICAN AMERICAN: 24 mL/min/{1.73_m2} — AB (ref >59)
GFR, EST NON AFRICAN AMERICAN: 21 mL/min/{1.73_m2} — AB (ref >59)
Glucose: 195 mg/dL — ABNORMAL HIGH (ref 65–99)
POTASSIUM: 5 mmol/L (ref 3.5–5.2)
Sodium: 142 mmol/L (ref 134–144)

## 2013-06-14 LAB — HEMOGLOBIN A1C
ESTIMATED AVERAGE GLUCOSE: 203 mg/dL
Hgb A1c MFr Bld: 8.7 % — ABNORMAL HIGH (ref 4.8–5.6)

## 2013-06-20 DIAGNOSIS — H31009 Unspecified chorioretinal scars, unspecified eye: Secondary | ICD-10-CM | POA: Diagnosis not present

## 2013-06-26 ENCOUNTER — Other Ambulatory Visit: Payer: Self-pay | Admitting: *Deleted

## 2013-06-26 MED ORDER — HYDROCODONE-ACETAMINOPHEN 5-325 MG PO TABS: 1.0000 | ORAL_TABLET | Freq: Four times a day (QID) | ORAL | Status: DC | PRN

## 2013-06-29 DIAGNOSIS — H35359 Cystoid macular degeneration, unspecified eye: Secondary | ICD-10-CM | POA: Diagnosis not present

## 2013-07-04 ENCOUNTER — Telehealth: Payer: Self-pay | Admitting: *Deleted

## 2013-07-04 NOTE — Telephone Encounter (Signed)
Patient wife called and stated that patient was losing urination control. Please Advise.  Tried calling wife back --Voicemail full and cannot leave message and then the second time rang busy.

## 2013-07-04 NOTE — Telephone Encounter (Signed)
Dr Bubba Camp spoke with wife and wife will make an appointment for her husband to come in next week.

## 2013-07-11 ENCOUNTER — Ambulatory Visit: Payer: Medicare Other | Admitting: Internal Medicine

## 2013-07-26 ENCOUNTER — Other Ambulatory Visit: Payer: Self-pay | Admitting: *Deleted

## 2013-07-26 ENCOUNTER — Telehealth: Payer: Self-pay | Admitting: *Deleted

## 2013-07-26 MED ORDER — HYDROCODONE-ACETAMINOPHEN 5-325 MG PO TABS: 1.0000 | ORAL_TABLET | Freq: Four times a day (QID) | ORAL | Status: DC | PRN

## 2013-07-26 NOTE — Telephone Encounter (Signed)
Spoke with Lattie Haw at [facility] # [phone] called and left a message that she will be sending paperwork over for patient to get a motorized wheelchair. While I had her on the phone I discussed patient's medication with her also. She stated that they assisted him over a month ago and gave him free insulin and filled out forms for patient to receive it for free. They sent numerous  Messages for patient to call them to sign the paperwork and to get proof of income. After getting in touch with him the wife stated that she sent in the stuff but [facility] never received it. Another application was filled out and the wife would not give proof of income nor the denial letter from the insurance company. And the husband told her that he didn't want the assistance because his insurance company was going to pay for the medication this month. The nurse told me she talked with [name] recently and she stated that they had some medication. [facility] cannot give them another month free. Patient and wife are not willing to complete the paperwork for medication assistance. Please Advise.

## 2013-07-26 NOTE — Telephone Encounter (Signed)
Patient wife called and stated that patient is out of his insulin and out of Money. Wants to know their options. Please Advise.

## 2013-07-26 NOTE — Telephone Encounter (Signed)
Do we have any samples on the insulin he takes? Can we have [employer]  Involved to see if they can provide assistance? Can you have Caren Griffins look into this and get back with me. It is very important to have his sugar controlled given his illness. thanks

## 2013-07-26 NOTE — Telephone Encounter (Signed)
Patient wife Requested

## 2013-07-27 NOTE — Telephone Encounter (Signed)
Make appointment with Aaron Long to discuss this further. i dont think we have much options at present until they fill and submit necessary paperwork

## 2013-07-27 NOTE — Telephone Encounter (Signed)
Informed Aaron Long and she stated she will call the wife to schedule the appointment.

## 2013-07-27 NOTE — Telephone Encounter (Signed)
Caren Griffins spoke with [name] and she stated that her husband didn't fill out the paperwork because he though they would not qualify because of his income. Caren Griffins explained the process and scheduled an appointment for patient for 08/07/2013. Caren Griffins told her that we would watch for samples and call her if we get any in.

## 2013-07-30 ENCOUNTER — Other Ambulatory Visit: Payer: Self-pay | Admitting: Internal Medicine

## 2013-07-31 NOTE — Telephone Encounter (Signed)
RX should be on file from 05/30/2013 60/5 refills were given

## 2013-08-01 ENCOUNTER — Telehealth: Payer: Self-pay | Admitting: *Deleted

## 2013-08-01 NOTE — Telephone Encounter (Signed)
Per Dr. Bubba Camp  Patient to take 50mg  of Lyrica Twice daily. Samples  given and Caren Griffins will Notify patient.

## 2013-08-01 NOTE — Telephone Encounter (Signed)
[name] called and spoke with Caren Griffins regarding patient needing samples for Lyrica 75mg . Patient is out of them and have not been taking it but wants something. We do have samples of the 50mg . Please Advise.

## 2013-08-07 ENCOUNTER — Encounter: Payer: Self-pay | Admitting: Internal Medicine

## 2013-08-07 ENCOUNTER — Ambulatory Visit (INDEPENDENT_AMBULATORY_CARE_PROVIDER_SITE_OTHER): Payer: Medicare Other | Admitting: Internal Medicine

## 2013-08-07 VITALS — BP 180/76 | HR 88 | Temp 98.1°F

## 2013-08-07 DIAGNOSIS — E1159 Type 2 diabetes mellitus with other circulatory complications: Secondary | ICD-10-CM | POA: Diagnosis not present

## 2013-08-07 DIAGNOSIS — M5412 Radiculopathy, cervical region: Secondary | ICD-10-CM | POA: Diagnosis not present

## 2013-08-07 DIAGNOSIS — E1142 Type 2 diabetes mellitus with diabetic polyneuropathy: Secondary | ICD-10-CM

## 2013-08-07 DIAGNOSIS — S88119A Complete traumatic amputation at level between knee and ankle, unspecified lower leg, initial encounter: Secondary | ICD-10-CM

## 2013-08-07 DIAGNOSIS — E1149 Type 2 diabetes mellitus with other diabetic neurological complication: Secondary | ICD-10-CM

## 2013-08-07 DIAGNOSIS — I10 Essential (primary) hypertension: Secondary | ICD-10-CM | POA: Diagnosis not present

## 2013-08-07 DIAGNOSIS — Z89512 Acquired absence of left leg below knee: Secondary | ICD-10-CM

## 2013-08-07 DIAGNOSIS — Z89511 Acquired absence of right leg below knee: Secondary | ICD-10-CM

## 2013-08-07 DIAGNOSIS — IMO0002 Reserved for concepts with insufficient information to code with codable children: Secondary | ICD-10-CM

## 2013-08-07 DIAGNOSIS — E1140 Type 2 diabetes mellitus with diabetic neuropathy, unspecified: Secondary | ICD-10-CM

## 2013-08-07 DIAGNOSIS — E1151 Type 2 diabetes mellitus with diabetic peripheral angiopathy without gangrene: Secondary | ICD-10-CM

## 2013-08-07 DIAGNOSIS — I798 Other disorders of arteries, arterioles and capillaries in diseases classified elsewhere: Secondary | ICD-10-CM

## 2013-08-07 DIAGNOSIS — I739 Peripheral vascular disease, unspecified: Secondary | ICD-10-CM

## 2013-08-07 DIAGNOSIS — M5010 Cervical disc disorder with radiculopathy, unspecified cervical region: Secondary | ICD-10-CM

## 2013-08-07 DIAGNOSIS — E1165 Type 2 diabetes mellitus with hyperglycemia: Principal | ICD-10-CM

## 2013-08-07 MED ORDER — CLOPIDOGREL BISULFATE 75 MG PO TABS: 75.0000 mg | ORAL_TABLET | Freq: Every day | ORAL | Status: DC

## 2013-08-07 MED ORDER — INSULIN ASPART 100 UNIT/ML FLEXPEN: PEN_INJECTOR | SUBCUTANEOUS | Status: DC

## 2013-08-07 MED ORDER — DOXAZOSIN MESYLATE 1 MG PO TABS: 1.0000 mg | ORAL_TABLET | Freq: Every day | ORAL | Status: DC

## 2013-08-07 MED ORDER — METOPROLOL SUCCINATE ER 25 MG PO TB24: 25.0000 mg | ORAL_TABLET | Freq: Every day | ORAL | Status: DC

## 2013-08-07 MED ORDER — AMBULATORY NON FORMULARY MEDICATION: Status: DC

## 2013-08-07 MED ORDER — PRAVASTATIN SODIUM 20 MG PO TABS: 20.0000 mg | ORAL_TABLET | Freq: Every day | ORAL | Status: DC

## 2013-08-07 MED ORDER — HYDRALAZINE HCL 25 MG PO TABS: 25.0000 mg | ORAL_TABLET | Freq: Three times a day (TID) | ORAL | Status: DC

## 2013-08-07 MED ORDER — PANTOPRAZOLE SODIUM 20 MG PO TBEC: 20.0000 mg | DELAYED_RELEASE_TABLET | Freq: Every day | ORAL | Status: DC

## 2013-08-07 MED ORDER — PREGABALIN 75 MG PO CAPS: ORAL_CAPSULE | ORAL | Status: DC

## 2013-08-07 NOTE — Progress Notes (Signed)
Patient ID: Aaron Long, male   DOB: [birth year], 73 y.o.   MRN: [id number]    Chief Complaint  Patient presents with  . Follow-up    discuss DM medication and refiills& Genetic Testing results  . Shoulder Problem    Rt shoulder and neck pain  . form    face to face mobility assessment   Allergies  Allergen Reactions  . Oxycodone Other (See Comments)    Hallucinations   HPI 73 y/o male pt is here for follow up. He has DM, PVD s/p B/L BKA, CKD, uncontrolled HTN among others.  cbg 90-200, a couple of reading in 60-70. His wife is present with him. His bp is elevated this office visit. There had been concerns about him getting his insulin with insurance and cost issue. At that point he was referred to [facility] had requested some forms to be filled out to help pt obtain meds at subsidized price. Pt and family did not fill out the form. Today, wife however mentions that the form has been filled out and mailed to [facility] for further assistance.  Patient mentions that the right neck and shoulder pain has been bothering him. This makes it difficult for him to wheel himself around. He is on wheelcahir and has form that needs to be filled out for a new powered wheelchair  Review of Systems   Constitutional: Negative for diaphoresis, fever or chills HENT: Negative for congestion and sore throat.    Eyes: Negative for blurred vision and double vision.   Respiratory: Negative for cough, shortness of breath and wheezing.    Cardiovascular: Negative for chest pain, palpitation   Gastrointestinal: Negative for heartburn, nausea, vomiting, abdominal pain, diarrhea.   Genitourinary: Negative for dysuria Musculoskeletal: Negative for myalgias and falls. Has right sided neck and shoulder pain. has both legs amputated Skin: Negative for rash.   Neurological: Negative for dizziness and headaches.   Psychiatric/Behavioral: Negative for memory loss. The patient is not nervous/anxious. no sleep  disturbances  Past Medical History  Diagnosis Date  . Prostate cancer 2010  . PAD (peripheral artery disease)   . Gangrene of toe     dry  . Neuromuscular disorder     diabetic neuropathy  . Hypertension     takes Amlodipine daily and Metoprolol bid  . Hyperlipidemia     takes Pravastatin daily  . Myocardial infarction 2005  . Peripheral neuropathy   . Constipation     takes Miralax daily  . Hemorrhoids   . Hx of colonic polyps   . History of kidney stones   . Diabetes mellitus     Novolog and Levemir daily  . Coronary artery disease   . BPH (benign prostatic hypertrophy)   . Abnormality of gait   . Coronary atherosclerosis of native coronary artery   . Phlebitis and thrombophlebitis of other deep vessels of lower extremities   . Chronic kidney disease   . Hypertrophy of prostate without urinary obstruction and other lower urinary tract symptoms (LUTS)   . Disorder of bone and cartilage, unspecified   . Lower limb amputation, below knee   . Long term (current) use of anticoagulants   . Anemia 06/19/2012   Medication reviewed. See [facility]  Lab Results  Component Value Date   HGBA1C 8.7* 06/13/2013   Physical exam BP 180/76  Pulse 88  Temp(Src) 98.1 F (36.7 C) (Oral)  SpO2 99%  General- elderly male in no acute distress Head- atraumatic, normocephalic Eyes-  PERRLA, EOMI, no pallor, no icterus Neck- no lymphadenopathy, no thyromegaly, has cervical region tenderness with right shoulder ROM limited with pain. Good radial pulses Chest- no chest wall deformities, no chest wall tenderness Cardiovascular- normal s1,s2, no murmurs/ rubs/ gallops Respiratory- bilateral clear to auscultation, no wheeze, no rhonchi, no crackles Abdomen- bowel sounds present, soft, non tender Musculoskeletal- able to move his upper extremities, ROM on right side limited with pain thus limiting his abduction, no spinal and paraspinal tenderness, has bilateral BKA and uses a wheelchair. Upper body  strength on right side is 4/5 and 5/5 on left side. Lower extremity strength 2/5  Neurological- no focal deficit Psychiatry- alert and oriented to person, place and time, normal mood and affect  CMP     Component Value Date/Time   NA 142 06/13/2013 1226   NA 138 04/23/2013 1536   K 5.0 06/13/2013 1226   CL 103 06/13/2013 1226   CO2 25 06/13/2013 1226   GLUCOSE 195* 06/13/2013 1226   GLUCOSE 218* 04/23/2013 1536   BUN 36* 06/13/2013 1226   BUN 38* 04/23/2013 1536   CREATININE 2.84* 06/13/2013 1226   CALCIUM 9.0 06/13/2013 1226   PROT 6.6 04/20/2013 1610   PROT 6.0 04/10/2013 1319   ALBUMIN 3.1* 04/20/2013 1610   AST 16 04/20/2013 1610   ALT 13 04/20/2013 1610   ALKPHOS 128* 04/20/2013 1610   BILITOT <0.2* 04/20/2013 1610   GFRNONAA 21* 06/13/2013 1226   GFRAA 24* 06/13/2013 1226   Xray cervical spine FINDINGS: The cervical vertebral bodies are preserved in height. There is mild disc space narrowing at the C5-6 and C6-7 disc levels. The prevertebral soft tissue spaces appear normal. The oblique views reveal mild bony encroachment upon the neural foramina bilaterally in the mid cervical spine. The odontoid is intact and lateral masses of C1 align normally with those of C2. There are calcifications in the region of the carotid arteries bilaterally.   IMPRESSION: There is no evidence of an acute compression fracture nor other acute bony abnormality of the cervical spine. There is disc space narrowing at C5-6 and C6-7 with mild facet joint hypertrophy and neural foraminal encroachment bilaterally in the mid and lower cervical spine. If the patient's symptoms persist, elective MRI in of the cervical spine may be of value if the patient can tolerate the procedure.   Assessment/plan  1. Cervical disc disorder with radiculopathy of cervical region Reviewed cervical spine xray. Have tried narcotics and muscle relaxant in past without much help. strength 4/5 in RUE - Ambulatory referral to Orthopedic  Surgery for possible steroid injection vs other treatment option  2. Essential hypertension Elevated bp, increase hydralazine to 25 mg tid. Continue amlodipine aspirin and toprol xl current dose. Reassess in 4 weeks for bp  3. DM type 2, uncontrolled, with neuropathy Continue levemir 30 u and novolog 5 u tid with meals for cbg > 150 only. Pt mentions having filled out assistance form for thn to help with meds. Med compliance reinforced. continue asa and statin and not on AcEI/ ARB with impaired renal function  4. Diabetes mellitus with peripheral vascular disease See above  5. Status post bilateral below knee amputation See above. Patient with both BKA and having cervical radiculopathy limiting his ability to move around safely with manual wheelchair. His right sided radiculopathy and pain is limiting his ability to safely push the manual WC and also increases risk of further injury. He cannot use a cane or walker with his b/l BKA. He  will benefit from a powered wheelchair to help improve his mobility and to complete his ADLs in a timely and safe manner and to remain independent within his home. Patient is both physically and mentally capable of operating all controls on a powered wheelchair

## 2013-08-09 ENCOUNTER — Telehealth: Payer: Self-pay | Admitting: *Deleted

## 2013-08-09 NOTE — Telephone Encounter (Signed)
Dr. Bubba Camp completed the forms for Power Wheelchair. Faxed to [employer] to Luretha Rued at [employer] [phone] # [phone]. Called and left message with him that they were faxed.

## 2013-08-13 NOTE — Telephone Encounter (Signed)
Sent Forms to be Scanned.

## 2013-08-14 ENCOUNTER — Encounter: Payer: Self-pay | Admitting: Internal Medicine

## 2013-08-16 DIAGNOSIS — M5030 Other cervical disc degeneration, unspecified cervical region: Secondary | ICD-10-CM | POA: Diagnosis not present

## 2013-08-23 ENCOUNTER — Other Ambulatory Visit: Payer: Self-pay | Admitting: *Deleted

## 2013-08-23 MED ORDER — HYDROCODONE-ACETAMINOPHEN 5-325 MG PO TABS: 1.0000 | ORAL_TABLET | Freq: Four times a day (QID) | ORAL | Status: DC | PRN

## 2013-09-12 ENCOUNTER — Telehealth: Payer: Self-pay | Admitting: Internal Medicine

## 2013-09-12 NOTE — Telephone Encounter (Signed)
Called pt to discuss recent denial from Medicare for a Copy.  Pt needs appointment with PCP to discuss. [name] states he is experiencing limited upper strength and pain when operating his manual wheel chair, says the prosthesis hurts when he tries to walk.   Left message at patients home to call the office back for an appointment.  cdavis

## 2013-09-12 NOTE — Telephone Encounter (Signed)
Aaron Long returned my call.  Told her we need to schedule another appointment with Aaron Long to evaluate his need for a PWC.  Aaron Long, says she will discuss with Aaron Long and call our office back....cdavis. [name]

## 2013-09-12 NOTE — Telephone Encounter (Signed)
Noted  

## 2013-09-18 ENCOUNTER — Ambulatory Visit: Payer: Medicare Other | Admitting: Internal Medicine

## 2013-09-21 NOTE — Telephone Encounter (Signed)
Mrs. Lessley call back, stated she was misunderstood.  Says she did not says patient may be trying to see another doctor. I apologize to Mrs. Blowe.  She indicated pt has another appt scheduled.cdavis

## 2013-09-24 ENCOUNTER — Telehealth: Payer: Self-pay | Admitting: Internal Medicine

## 2013-09-24 NOTE — Telephone Encounter (Signed)
Referred to [facility] per Dr. Bubba Camp...Marland KitchenMarland Kitchen

## 2013-09-26 ENCOUNTER — Encounter: Payer: Self-pay | Admitting: Internal Medicine

## 2013-09-26 ENCOUNTER — Ambulatory Visit (INDEPENDENT_AMBULATORY_CARE_PROVIDER_SITE_OTHER): Payer: Medicare Other | Admitting: Internal Medicine

## 2013-09-26 VITALS — BP 138/84 | HR 70 | Temp 98.4°F | Resp 12

## 2013-09-26 DIAGNOSIS — E785 Hyperlipidemia, unspecified: Secondary | ICD-10-CM

## 2013-09-26 DIAGNOSIS — I10 Essential (primary) hypertension: Secondary | ICD-10-CM

## 2013-09-26 DIAGNOSIS — IMO0002 Reserved for concepts with insufficient information to code with codable children: Secondary | ICD-10-CM

## 2013-09-26 DIAGNOSIS — Z23 Encounter for immunization: Secondary | ICD-10-CM

## 2013-09-26 DIAGNOSIS — E1129 Type 2 diabetes mellitus with other diabetic kidney complication: Secondary | ICD-10-CM

## 2013-09-26 DIAGNOSIS — K5909 Other constipation: Secondary | ICD-10-CM

## 2013-09-26 DIAGNOSIS — E1165 Type 2 diabetes mellitus with hyperglycemia: Secondary | ICD-10-CM

## 2013-09-26 MED ORDER — HYDROCODONE-ACETAMINOPHEN 5-325 MG PO TABS: 1.0000 | ORAL_TABLET | Freq: Four times a day (QID) | ORAL | Status: DC | PRN

## 2013-09-26 MED ORDER — LINACLOTIDE 145 MCG PO CAPS: 145.0000 ug | ORAL_CAPSULE | Freq: Every day | ORAL | Status: DC

## 2013-09-26 NOTE — Progress Notes (Signed)
Patient ID: Aaron Long, male   DOB: 03-02-1940, 73 y.o.   MRN: [id number]    Chief Complaint  Patient presents with  . Medical Management of Chronic Issues    f/u on missed appointments  . Orders    Request for PWC   Allergies  Allergen Reactions  . Oxycodone Other (See Comments)    Hallucinations   HPI 73 y/o male patient is seen today for his DM and HTN.  cbg not checked this am. He does not check cbg on regular interval, sometimes once a day and sometimes two times a day. He then mentions morning cbg mostly is cbg 78-100 cbg during daytime 160-170 He is injecting 22 u of levemir and has not used aspart recently as no reading is above 200 When asked about reading of 160-170 and not taking novolog, pt mentions, he has required it sometime but not as much. Thus he has inconsistent history He had requested for powered wheelchair but it has been denied  Review of Systems  Constitutional: Negative for fever, chills, diaphoresis.  HENT: Negative for congestion Respiratory: Negative for cough, sputum production, shortness of breath and wheezing.   Cardiovascular: Negative for chest pain, palpitations, orthopnea and leg swelling.  Gastrointestinal: Negative for heartburn, nausea, vomiting, abdominal pain, diarrhea and constipation.  Genitourinary: Negative for dysuria.  Musculoskeletal: Negative for falls, joint pain  Skin: Negative for itching and rash.  Neurological: negative for dizziness, tingling, focal weakness and headaches.  Psychiatric/Behavioral: Negative for depression and memory loss. The patient is not nervous/anxious.    Past Medical History  Diagnosis Date  . Prostate cancer 2010  . PAD (peripheral artery disease)   . Gangrene of toe     dry  . Neuromuscular disorder     diabetic neuropathy  . Hypertension     takes Amlodipine daily and Metoprolol bid  . Hyperlipidemia     takes Pravastatin daily  . Myocardial infarction 2005  . Peripheral neuropathy     . Constipation     takes Miralax daily  . Hemorrhoids   . Hx of colonic polyps   . History of kidney stones   . Diabetes mellitus     Novolog and Levemir daily  . Coronary artery disease   . BPH (benign prostatic hypertrophy)   . Abnormality of gait   . Coronary atherosclerosis of native coronary artery   . Phlebitis and thrombophlebitis of other deep vessels of lower extremities   . Chronic kidney disease   . Hypertrophy of prostate without urinary obstruction and other lower urinary tract symptoms (LUTS)   . Disorder of bone and cartilage, unspecified   . Lower limb amputation, below knee   . Long term (current) use of anticoagulants   . Anemia 06/19/2012   Current Outpatient Prescriptions on File Prior to Visit  Medication Sig Dispense Refill  . AMBULATORY NON FORMULARY MEDICATION One Touch Ultra 2 Test Strips Sig:  Check blood sugars Three times daily to keep blood sugars under control Dx: 250.00, 401.9  100 strip  11  . AMBULATORY NON FORMULARY MEDICATION BD U/P Mini Pen [place] 31GX5MM Use three times daily as directed DX:250.00  100 each  11  . amLODipine (NORVASC) 10 MG tablet Take 1 tablet (10 mg total) by mouth daily.  90 tablet  3  . aspirin EC 81 MG tablet Take 1 tablet (81 mg total) by mouth every morning.      . clopidogrel (PLAVIX) 75 MG tablet Take 1 tablet (75  mg total) by mouth daily.  90 tablet  1  . doxazosin (CARDURA) 1 MG tablet Take 1 tablet (1 mg total) by mouth at bedtime.  90 tablet  1  . hydrALAZINE (APRESOLINE) 25 MG tablet Take 1 tablet (25 mg total) by mouth 3 (three) times daily.  90 tablet  3  . insulin aspart (NOVOLOG FLEXPEN) 100 UNIT/ML FlexPen Inject 5 units three times a day with meals if blood sugar greater than 150 only, DX: 250.71  5 pen  3  . Insulin Detemir (LEVEMIR) 100 UNIT/ML Pen Inject 30 Units into the skin at bedtime.  5 pen  3  . metoprolol succinate (TOPROL-XL) 25 MG 24 hr tablet Take 1 tablet (25 mg total) by mouth daily.  90 tablet   1  . pantoprazole (PROTONIX) 20 MG tablet Take 1 tablet (20 mg total) by mouth daily.  90 tablet  1  . pravastatin (PRAVACHOL) 20 MG tablet Take 1 tablet (20 mg total) by mouth daily.  90 tablet  3  . pregabalin (LYRICA) 75 MG capsule Take one tablet by mouth twice daily for pains  60 capsule  5   No current facility-administered medications on file prior to visit.    Physical exam BP 138/84  Pulse 70  Temp(Src) 98.4 F (36.9 C) (Oral)  Resp 12  SpO2 96%  General- elderly male in no acute distress Head- atraumatic, normocephalic Eyes- PERRLA, EOMI, no pallor, no icterus Neck- no lymphadenopathy, no thyromegaly Cardiovascular- normal s1,s2, no murmurs Respiratory- bilateral clear to auscultation, no wheeze, no rhonchi, no crackles Abdomen- bowel sounds present, soft, non tender Musculoskeletal- able to move his upper extremities, ROM on right side limited with pain thus limiting his abduction, no spinal and paraspinal tenderness, has bilateral BKA and uses a wheelchair. Upper body strength on right side is 4/5 and 5/5 on left side. Lower extremity strength 2/5   Neurological- no focal deficit Psychiatry- alert and oriented to person, place and time, normal mood and affect  Labs-  Lab Results  Component Value Date   HGBA1C 8.7* 06/13/2013   Lipid Panel     Component Value Date/Time   TRIG 131 04/10/2013 1319   HDL 34* 04/10/2013 1319   CHOLHDL 5.8* 04/10/2013 1319   LDLCALC 137* 04/10/2013 1319   Lab Results  Component Value Date   CREATININE 2.84* 06/13/2013   Assessment/plan  1. Need for prophylactic vaccination and inoculation against influenza Influenza vaccine provided  2. Essential hypertension - Stable bp reading.  - Hemoglobin A1c - CMP - hydralazine to 25 mg tid, toprol 25 mg daily and amlodipine 10 mg daily. Continue aspirin 81 mg daily  3. Hyperlipidemia with target LDL less than 100 Recheck flp. Continue pravastatin - Lipid Panel  4. Other  constipation Continue linzess  - Ambulatory referral to Gastroenterology  5. DM type 2, uncontrolled, with renal complications To take levemir 22 u for now (self adjusted) and SSI novolog, check ac today and adjust med for goal a1c <7. Continue statin and ASA - Hemoglobin A1c

## 2013-09-27 LAB — LIPID PANEL
CHOL/HDL RATIO: 4.8 ratio (ref 0.0–5.0)
Cholesterol, Total: 168 mg/dL (ref 100–199)
HDL: 35 mg/dL — AB (ref >39)
LDL CALC: 101 mg/dL — AB (ref 0–99)
Triglycerides: 158 mg/dL — ABNORMAL HIGH (ref 0–149)
VLDL Cholesterol Cal: 32 mg/dL (ref 5–40)

## 2013-09-27 LAB — COMPREHENSIVE METABOLIC PANEL
ALT: 10 IU/L (ref 0–44)
AST: 12 IU/L (ref 0–40)
Albumin/Globulin Ratio: 1.6 (ref 1.1–2.5)
Albumin: 3.9 g/dL (ref 3.5–4.8)
Alkaline Phosphatase: 101 IU/L (ref 39–117)
BUN/Creatinine Ratio: 10 (ref 10–22)
BUN: 39 mg/dL — AB (ref 8–27)
CALCIUM: 9.5 mg/dL (ref 8.6–10.2)
CO2: 23 mmol/L (ref 18–29)
Chloride: 106 mmol/L (ref 97–108)
Creatinine, Ser: 3.72 mg/dL — ABNORMAL HIGH (ref 0.76–1.27)
GFR calc Af Amer: 18 mL/min/{1.73_m2} — ABNORMAL LOW (ref >59)
GFR calc non Af Amer: 15 mL/min/{1.73_m2} — ABNORMAL LOW (ref >59)
GLOBULIN, TOTAL: 2.5 g/dL (ref 1.5–4.5)
Glucose: 107 mg/dL — ABNORMAL HIGH (ref 65–99)
Potassium: 5.9 mmol/L — ABNORMAL HIGH (ref 3.5–5.2)
Sodium: 142 mmol/L (ref 134–144)
Total Bilirubin: <0.2 mg/dL (ref 0.0–1.2)
Total Protein: 6.4 g/dL (ref 6.0–8.5)

## 2013-09-27 LAB — HEMOGLOBIN A1C
Est. average glucose Bld gHb Est-mCnc: 157 mg/dL
HEMOGLOBIN A1C: 7.1 % — AB (ref 4.8–5.6)

## 2013-10-02 DIAGNOSIS — E875 Hyperkalemia: Secondary | ICD-10-CM

## 2013-10-02 MED ORDER — SODIUM POLYSTYRENE SULFONATE 15 GM/60ML PO SUSP: ORAL | Status: DC

## 2013-10-03 NOTE — Telephone Encounter (Signed)
Pt had an appointment with Dr. Bubba Camp on last week...cdavis

## 2013-10-05 ENCOUNTER — Other Ambulatory Visit: Payer: Self-pay

## 2013-10-25 ENCOUNTER — Other Ambulatory Visit: Payer: Self-pay | Admitting: *Deleted

## 2013-10-25 MED ORDER — HYDROCODONE-ACETAMINOPHEN 5-325 MG PO TABS: 1.0000 | ORAL_TABLET | Freq: Four times a day (QID) | ORAL | Status: DC | PRN

## 2013-10-25 NOTE — Telephone Encounter (Signed)
Patient wife, [name] Requested. Printed and left for pick up

## 2013-11-07 ENCOUNTER — Emergency Department (HOSPITAL_COMMUNITY)
Admission: EM | Admit: 2013-11-07 | Discharge: 2013-11-07 | Disposition: A | Discharge status: Home / Self Care | Payer: Medicare Other | Attending: Emergency Medicine | Admitting: Emergency Medicine

## 2013-11-07 ENCOUNTER — Encounter (HOSPITAL_COMMUNITY): Payer: Self-pay | Admitting: Emergency Medicine

## 2013-11-07 ENCOUNTER — Emergency Department (HOSPITAL_COMMUNITY): Payer: Medicare Other

## 2013-11-07 DIAGNOSIS — Z862 Personal history of diseases of the blood and blood-forming organs and certain disorders involving the immune mechanism: Secondary | ICD-10-CM | POA: Diagnosis not present

## 2013-11-07 DIAGNOSIS — M546 Pain in thoracic spine: Secondary | ICD-10-CM

## 2013-11-07 DIAGNOSIS — Z7982 Long term (current) use of aspirin: Secondary | ICD-10-CM | POA: Diagnosis not present

## 2013-11-07 DIAGNOSIS — Z951 Presence of aortocoronary bypass graft: Secondary | ICD-10-CM | POA: Diagnosis not present

## 2013-11-07 DIAGNOSIS — Z8546 Personal history of malignant neoplasm of prostate: Secondary | ICD-10-CM | POA: Diagnosis not present

## 2013-11-07 DIAGNOSIS — S31609A Unspecified open wound of abdominal wall, unspecified quadrant with penetration into peritoneal cavity, initial encounter: Secondary | ICD-10-CM | POA: Diagnosis not present

## 2013-11-07 DIAGNOSIS — K5900 Constipation, unspecified: Secondary | ICD-10-CM | POA: Diagnosis not present

## 2013-11-07 DIAGNOSIS — Z7901 Long term (current) use of anticoagulants: Secondary | ICD-10-CM | POA: Insufficient documentation

## 2013-11-07 DIAGNOSIS — Z8739 Personal history of other diseases of the musculoskeletal system and connective tissue: Secondary | ICD-10-CM | POA: Insufficient documentation

## 2013-11-07 DIAGNOSIS — N189 Chronic kidney disease, unspecified: Secondary | ICD-10-CM | POA: Insufficient documentation

## 2013-11-07 DIAGNOSIS — Z79899 Other long term (current) drug therapy: Secondary | ICD-10-CM | POA: Insufficient documentation

## 2013-11-07 DIAGNOSIS — R079 Chest pain, unspecified: Secondary | ICD-10-CM | POA: Diagnosis not present

## 2013-11-07 DIAGNOSIS — Z7902 Long term (current) use of antithrombotics/antiplatelets: Secondary | ICD-10-CM | POA: Diagnosis not present

## 2013-11-07 DIAGNOSIS — Z87891 Personal history of nicotine dependence: Secondary | ICD-10-CM | POA: Diagnosis not present

## 2013-11-07 DIAGNOSIS — I252 Old myocardial infarction: Secondary | ICD-10-CM | POA: Insufficient documentation

## 2013-11-07 DIAGNOSIS — W050XXA Fall from non-moving wheelchair, initial encounter: Secondary | ICD-10-CM | POA: Insufficient documentation

## 2013-11-07 DIAGNOSIS — Y9289 Other specified places as the place of occurrence of the external cause: Secondary | ICD-10-CM | POA: Diagnosis not present

## 2013-11-07 DIAGNOSIS — I129 Hypertensive chronic kidney disease with stage 1 through stage 4 chronic kidney disease, or unspecified chronic kidney disease: Secondary | ICD-10-CM | POA: Insufficient documentation

## 2013-11-07 DIAGNOSIS — W19XXXA Unspecified fall, initial encounter: Secondary | ICD-10-CM

## 2013-11-07 DIAGNOSIS — I10 Essential (primary) hypertension: Secondary | ICD-10-CM | POA: Diagnosis not present

## 2013-11-07 DIAGNOSIS — G629 Polyneuropathy, unspecified: Secondary | ICD-10-CM | POA: Diagnosis not present

## 2013-11-07 DIAGNOSIS — R100 Acute abdomen: Secondary | ICD-10-CM | POA: Diagnosis not present

## 2013-11-07 DIAGNOSIS — Z86010 Personal history of colonic polyps: Secondary | ICD-10-CM | POA: Diagnosis not present

## 2013-11-07 DIAGNOSIS — Z794 Long term (current) use of insulin: Secondary | ICD-10-CM | POA: Insufficient documentation

## 2013-11-07 DIAGNOSIS — Y9389 Activity, other specified: Secondary | ICD-10-CM | POA: Diagnosis not present

## 2013-11-07 DIAGNOSIS — E119 Type 2 diabetes mellitus without complications: Secondary | ICD-10-CM | POA: Insufficient documentation

## 2013-11-07 DIAGNOSIS — I2510 Atherosclerotic heart disease of native coronary artery without angina pectoris: Secondary | ICD-10-CM | POA: Insufficient documentation

## 2013-11-07 DIAGNOSIS — N400 Enlarged prostate without lower urinary tract symptoms: Secondary | ICD-10-CM | POA: Diagnosis not present

## 2013-11-07 DIAGNOSIS — Z89519 Acquired absence of unspecified leg below knee: Secondary | ICD-10-CM | POA: Insufficient documentation

## 2013-11-07 DIAGNOSIS — S299XXA Unspecified injury of thorax, initial encounter: Secondary | ICD-10-CM | POA: Diagnosis not present

## 2013-11-07 DIAGNOSIS — E785 Hyperlipidemia, unspecified: Secondary | ICD-10-CM | POA: Insufficient documentation

## 2013-11-07 DIAGNOSIS — Z87442 Personal history of urinary calculi: Secondary | ICD-10-CM | POA: Insufficient documentation

## 2013-11-07 MED ORDER — HYDROCODONE-ACETAMINOPHEN 5-325 MG PO TABS: 1.0000 | ORAL_TABLET | Freq: Four times a day (QID) | ORAL | Status: DC | PRN

## 2013-11-07 MED ORDER — HYDROCODONE-ACETAMINOPHEN 5-325 MG PO TABS
2.0000 | ORAL_TABLET | Freq: Once | ORAL | Status: AC
  Administered 2013-11-07: 2 via ORAL
  Filled 2013-11-07: qty 2

## 2013-11-07 NOTE — ED Notes (Signed)
Bed: PU92 Expected date:  Expected time:  Means of arrival:  Comments: EMS 73yo fall, bruising to left side

## 2013-11-07 NOTE — Discharge Instructions (Signed)
Back Pain, Adult Low back pain is very common. About 1 in 5 people have back pain.The cause of low back pain is rarely dangerous. The pain often gets better over time.About half of people with a sudden onset of back pain feel better in just 2 weeks. About 8 in 10 people feel better by 6 weeks.  CAUSES Some common causes of back pain include:  Strain of the muscles or ligaments supporting the spine.  Wear and tear (degeneration) of the spinal discs.  Arthritis.  Direct injury to the back. DIAGNOSIS Most of the time, the direct cause of low back pain is not known.However, back pain can be treated effectively even when the exact cause of the pain is unknown.Answering your caregiver's questions about your overall health and symptoms is one of the most accurate ways to make sure the cause of your pain is not dangerous. If your caregiver needs more information, he or she may order lab work or imaging tests (X-rays or MRIs).However, even if imaging tests show changes in your back, this usually does not require surgery. HOME CARE INSTRUCTIONS For many people, back pain returns.Since low back pain is rarely dangerous, it is often a condition that people can learn to manageon their own.   Remain active. It is stressful on the back to sit or stand in one place. Do not sit, drive, or stand in one place for more than 30 minutes at a time. Take short walks on level surfaces as soon as pain allows.Try to increase the length of time you walk each day.  Do not stay in bed.Resting more than 1 or 2 days can delay your recovery.  Do not avoid exercise or work.Your body is made to move.It is not dangerous to be active, even though your back may hurt.Your back will likely heal faster if you return to being active before your pain is gone.  Pay attention to your body when you bend and lift. Many people have less discomfortwhen lifting if they bend their knees, keep the load close to their bodies,and  avoid twisting. Often, the most comfortable positions are those that put less stress on your recovering back.  Find a comfortable position to sleep. Use a firm mattress and lie on your side with your knees slightly bent. If you lie on your back, put a pillow under your knees.  Only take over-the-counter or prescription medicines as directed by your caregiver. Over-the-counter medicines to reduce pain and inflammation are often the most helpful.Your caregiver may prescribe muscle relaxant drugs.These medicines help dull your pain so you can more quickly return to your normal activities and healthy exercise.  Put ice on the injured area.  Put ice in a plastic bag.  Place a towel between your skin and the bag.  Leave the ice on for 15-20 minutes, 03-04 times a day for the first 2 to 3 days. After that, ice and heat may be alternated to reduce pain and spasms.  Ask your caregiver about trying back exercises and gentle massage. This may be of some benefit.  Avoid feeling anxious or stressed.Stress increases muscle tension and can worsen back pain.It is important to recognize when you are anxious or stressed and learn ways to manage it.Exercise is a great option. SEEK MEDICAL CARE IF:  You have pain that is not relieved with rest or medicine.  You have pain that does not improve in 1 week.  You have new symptoms.  You are generally not feeling well. SEEK   IMMEDIATE MEDICAL CARE IF:   You have pain that radiates from your back into your legs.  You develop new bowel or bladder control problems.  You have unusual weakness or numbness in your arms or legs.  You develop nausea or vomiting.  You develop abdominal pain.  You feel faint. Document Released: 12/28/2004 Document Revised: 06/29/2011 Document Reviewed: 05/01/2013 [employer] Patient Information 2015 [employer]. This information is not intended to replace advice given to you by your health care provider. Make sure you  discuss any questions you have with your health care provider.  

## 2013-11-07 NOTE — ED Notes (Signed)
Per EMS pt from home, had fall around 2pm today, was in a wheelchair, bil BKA, fell over to the side hitting R side on door, did not hit head, denies LOC, bruising on R side, tender on palpation, pt thinks he broke a rib, denies shortness of breath, pt states hurts when moving around.

## 2013-11-07 NOTE — ED Notes (Signed)
Patient transported to X-ray 

## 2013-11-07 NOTE — ED Provider Notes (Signed)
CSN: [id number]     Arrival date & time 11/07/13  1929 History   First MD Initiated Contact with Patient 11/07/13 1933     Chief Complaint  Patient presents with  . Fall     (Consider location/radiation/quality/duration/timing/severity/associated sxs/prior Treatment) Patient is a 73 y.o. male presenting with fall.  Fall This is a new problem. The current episode started 1 to 2 hours ago. Episode frequency: once. The problem has not changed since onset.Associated symptoms include chest pain (R posterior chest wall). Pertinent negatives include no abdominal pain and no shortness of breath. Nothing aggravates the symptoms. Nothing relieves the symptoms. He has tried nothing for the symptoms.    Past Medical History  Diagnosis Date  . Prostate cancer 2010  . PAD (peripheral artery disease)   . Gangrene of toe     dry  . Neuromuscular disorder     diabetic neuropathy  . Hypertension     takes Amlodipine daily and Metoprolol bid  . Hyperlipidemia     takes Pravastatin daily  . Myocardial infarction 2005  . Peripheral neuropathy   . Constipation     takes Miralax daily  . Hemorrhoids   . Hx of colonic polyps   . History of kidney stones   . Diabetes mellitus     Novolog and Levemir daily  . Coronary artery disease   . BPH (benign prostatic hypertrophy)   . Abnormality of gait   . Coronary atherosclerosis of native coronary artery   . Phlebitis and thrombophlebitis of other deep vessels of lower extremities   . Chronic kidney disease   . Hypertrophy of prostate without urinary obstruction and other lower urinary tract symptoms (LUTS)   . Disorder of bone and cartilage, unspecified   . Lower limb amputation, below knee   . Long term (current) use of anticoagulants   . Anemia 06/19/2012   Past Surgical History  Procedure Laterality Date  . Prostate surgery  2009    prostatectomy  . Knee cartilage surgery  at age 38    left knee  . Cea      Right  . Coronary artery bypass  graft  2005    x 4  . Carotid endarterectomy  2005    right  . Cataract surgery  2011    bilateral  . Colonoscopy    . Cardiac catheterization  05/28/11  . Amputation  06/14/2011    Procedure: AMPUTATION DIGIT;  Surgeon: Elam Dutch, MD;  Location: [facility] OR;  Service: Vascular;  Laterality: Left;  Amputation Left fifth toe  . Amputation  06/16/2011    Procedure: AMPUTATION BELOW KNEE;  Surgeon: Elam Dutch, MD;  Location: [place];  Service: Vascular;  Laterality: Left;  . I&d extremity  01/31/2012    Procedure: IRRIGATION AND DEBRIDEMENT EXTREMITY;  Surgeon: Elam Dutch, MD;  Location: [place];  Service: Vascular;  Laterality: Left;  I & D Left BKA   . Amputation Right 06/12/2012    Procedure: AMPUTATION DIGIT;  Surgeon: Elam Dutch, MD;  Location[facility] OR;  Service: Vascular;  Laterality: Right;  GREAT TOE  . Below knee leg amputation Right 08/08/2012    Dr Oneida Alar  . Amputation Right 08/08/2012    Procedure: AMPUTATION BELOW KNEE;  Surgeon: Elam Dutch, MD;  Location: [facility] OR;  Service: Vascular;  Laterality: Right;   Family History  Problem Relation Age of Onset  . Coronary artery disease Neg Hx   . Anesthesia problems Neg Hx   .  Hypotension Neg Hx   . Malignant hyperthermia Neg Hx   . Pseudochol deficiency Neg Hx   . Hyperlipidemia Mother   . Hypertension Mother   . Cancer Mother   . Hyperlipidemia Father   . Hypertension Father   . Kidney disease Father   . Heart disease Sister   . Alzheimer's disease Sister   . Cancer Brother   . Other Sister    History  Substance Use Topics  . Smoking status: Former Smoker -- 1.00 packs/day for 40 years    Types: Cigarettes    Quit date: 04/28/1991  . Smokeless tobacco: Never Used     Comment: occ alcohol  . Alcohol Use: No    Review of Systems  Constitutional: Negative for fever.  Respiratory: Negative for shortness of breath.   Cardiovascular: Positive for chest pain (R posterior chest wall).  Gastrointestinal:  Negative for vomiting and abdominal pain.  All other systems reviewed and are negative.     Allergies  Oxycodone  Home Medications   Prior to Admission medications   Medication Sig Start Date End Date Taking? Authorizing Provider  AMBULATORY NON FORMULARY MEDICATION One Touch Ultra 2 Test Strips Sig:  Check blood sugars Three times daily to keep blood sugars under control Dx: 250.00, 401.9 12/06/12   Blanchie Serve, MD  AMBULATORY NON FORMULARY MEDICATION BD U/P Mini Pen Neddles 31GX5MM Use three times daily as directed DX:250.00 08/07/13   Blanchie Serve, MD  amLODipine (NORVASC) 10 MG tablet Take 1 tablet (10 mg total) by mouth daily. 12/06/12   Blanchie Serve, MD  aspirin EC 81 MG tablet Take 1 tablet (81 mg total) by mouth every morning. 08/21/12   Lavon Paganini Angiulli, PA-C  clopidogrel (PLAVIX) 75 MG tablet Take 1 tablet (75 mg total) by mouth daily. 08/07/13   Blanchie Serve, MD  doxazosin (CARDURA) 1 MG tablet Take 1 tablet (1 mg total) by mouth at bedtime. 08/07/13   Blanchie Serve, MD  hydrALAZINE (APRESOLINE) 25 MG tablet Take 1 tablet (25 mg total) by mouth 3 (three) times daily. 08/07/13   Blanchie Serve, MD  HYDROcodone-acetaminophen (NORCO/VICODIN) 5-325 MG per tablet Take 1 tablet by mouth every 6 (six) hours as needed for moderate pain. 10/25/13   Tiffany L Reed, DO  insulin aspart (NOVOLOG FLEXPEN) 100 UNIT/ML FlexPen Inject 5 units three times a day with meals if blood sugar greater than 150 only, DX: 250.71 08/07/13   Blanchie Serve, MD  Insulin Detemir (LEVEMIR) 100 UNIT/ML Pen Inject 30 Units into the skin at bedtime. 05/30/13   Blanchie Serve, MD  Linaclotide (LINZESS) 145 MCG CAPS capsule Take 1 capsule (145 mcg total) by mouth daily. 09/26/13   Blanchie Serve, MD  metoprolol succinate (TOPROL-XL) 25 MG 24 hr tablet Take 1 tablet (25 mg total) by mouth daily. 08/07/13   Blanchie Serve, MD  pantoprazole (PROTONIX) 20 MG tablet Take 1 tablet (20 mg total) by mouth daily. 08/07/13   Blanchie Serve, MD  pravastatin (PRAVACHOL) 20 MG tablet Take 1 tablet (20 mg total) by mouth daily. 08/07/13   Blanchie Serve, MD  pregabalin (LYRICA) 75 MG capsule Take one tablet by mouth twice daily for pains 08/07/13   Blanchie Serve, MD  sodium polystyrene (KAYEXALATE) 15 GM/60ML suspension 15 g daily x 2 days 10/02/13   Blanchie Serve, MD   BP 167/80  Pulse 67  Temp(Src) 98.5 F (36.9 C) (Oral)  Resp 17  SpO2 96% Physical Exam  Nursing note and vitals reviewed. Constitutional:  He is oriented to person, place, and time. He appears well-developed and well-nourished. No distress.  HENT:  Head: Normocephalic and atraumatic.  Mouth/Throat: No oropharyngeal exudate.  Eyes: EOM are normal. Pupils are equal, round, and reactive to light.  Neck: Normal range of motion. Neck supple.  Cardiovascular: Normal rate and regular rhythm.  Exam reveals no friction rub.   No murmur heard. Pulmonary/Chest: Effort normal and breath sounds normal. No respiratory distress. He has no wheezes. He has no rales. He exhibits tenderness (R posterior chest wall).  Abdominal: He exhibits no distension. There is no tenderness. There is no rebound.  Musculoskeletal: Normal range of motion. He exhibits no edema.  Neurological: He is alert and oriented to person, place, and time.  Skin: He is not diaphoretic.    ED Course  Procedures (including critical care time) Labs Review Labs Reviewed - No data to display  Imaging Review Dg Chest 2 View  11/07/2013   CLINICAL DATA:  Fall.  Right-sided chest pain.  Initial encounter  EXAM: CHEST  2 VIEW  COMPARISON:  04/23/2013  FINDINGS: Low lung volumes with mild atelectasis at the bases. There is no edema, consolidation, effusion, or pneumothorax. No cardiomegaly; negative aortic and hilar contours. CABG changes. No convincing fracture.  IMPRESSION: Mild atelectasis.   Electronically Signed   By: Jorje Guild M.D.   On: 11/07/2013 20:17     EKG Interpretation None       MDM   Final diagnoses:  Fall  Right-sided thoracic back pain    68M s/p fall. Fell after wheelchair malfunctioned. R chest hit the edge of the door jamb. No LOC. No head injury. No LOC. On plavix. On exam, R posterior rib pain with mild swelling. Lungs clear. No abdominal pain.  Will CXR. Concern for rib fracture.  CXR negative. Feeling better with vicodin, given Rx for same. Stable for discharge.    Evelina Bucy, MD 11/07/13 [phone]

## 2013-11-23 DIAGNOSIS — I10 Essential (primary) hypertension: Secondary | ICD-10-CM | POA: Diagnosis not present

## 2013-11-23 DIAGNOSIS — N184 Chronic kidney disease, stage 4 (severe): Secondary | ICD-10-CM | POA: Diagnosis not present

## 2013-11-23 DIAGNOSIS — N2581 Secondary hyperparathyroidism of renal origin: Secondary | ICD-10-CM | POA: Diagnosis not present

## 2013-11-23 DIAGNOSIS — E119 Type 2 diabetes mellitus without complications: Secondary | ICD-10-CM | POA: Diagnosis not present

## 2013-11-26 ENCOUNTER — Other Ambulatory Visit: Payer: Self-pay

## 2013-11-26 MED ORDER — AMBULATORY NON FORMULARY MEDICATION: Status: DC

## 2013-11-26 NOTE — Telephone Encounter (Signed)
RX sent to [facility] on [address]

## 2013-11-28 ENCOUNTER — Other Ambulatory Visit: Payer: Self-pay

## 2013-11-28 ENCOUNTER — Encounter: Payer: Self-pay | Admitting: Vascular Surgery

## 2013-11-28 DIAGNOSIS — Z01810 Encounter for preprocedural cardiovascular examination: Secondary | ICD-10-CM

## 2013-11-28 DIAGNOSIS — N184 Chronic kidney disease, stage 4 (severe): Secondary | ICD-10-CM

## 2013-12-05 ENCOUNTER — Other Ambulatory Visit: Payer: Self-pay

## 2013-12-05 MED ORDER — HYDROCODONE-ACETAMINOPHEN 5-325 MG PO TABS: 1.0000 | ORAL_TABLET | Freq: Four times a day (QID) | ORAL | Status: DC | PRN

## 2013-12-17 ENCOUNTER — Encounter: Payer: Self-pay | Admitting: Internal Medicine

## 2013-12-20 ENCOUNTER — Encounter (HOSPITAL_COMMUNITY): Payer: Self-pay | Admitting: Vascular Surgery

## 2013-12-26 ENCOUNTER — Encounter: Payer: Self-pay | Admitting: Vascular Surgery

## 2013-12-26 ENCOUNTER — Ambulatory Visit: Payer: Medicare Other | Admitting: Internal Medicine

## 2013-12-27 ENCOUNTER — Other Ambulatory Visit (HOSPITAL_COMMUNITY): Payer: Medicare Other

## 2013-12-27 ENCOUNTER — Ambulatory Visit: Payer: Medicare Other | Admitting: Vascular Surgery

## 2013-12-27 ENCOUNTER — Encounter (HOSPITAL_COMMUNITY): Payer: Medicare Other

## 2013-12-28 ENCOUNTER — Telehealth: Payer: Self-pay | Admitting: *Deleted

## 2013-12-28 ENCOUNTER — Other Ambulatory Visit: Payer: Self-pay | Admitting: Internal Medicine

## 2013-12-28 NOTE — Telephone Encounter (Signed)
[name], wife dropped off form for test strips. Filled out portion that I could and left in folder for patient's upcoming appointment on 01/01/2014. Patient wife aware.

## 2014-01-01 ENCOUNTER — Ambulatory Visit (INDEPENDENT_AMBULATORY_CARE_PROVIDER_SITE_OTHER): Payer: Medicare Other | Admitting: Internal Medicine

## 2014-01-01 ENCOUNTER — Encounter: Payer: Self-pay | Admitting: Internal Medicine

## 2014-01-01 VITALS — BP 144/80 | HR 72 | Temp 99.0°F | Resp 12

## 2014-01-01 DIAGNOSIS — E1151 Type 2 diabetes mellitus with diabetic peripheral angiopathy without gangrene: Secondary | ICD-10-CM | POA: Diagnosis not present

## 2014-01-01 DIAGNOSIS — Z89511 Acquired absence of right leg below knee: Secondary | ICD-10-CM

## 2014-01-01 DIAGNOSIS — N184 Chronic kidney disease, stage 4 (severe): Secondary | ICD-10-CM

## 2014-01-01 DIAGNOSIS — Z7189 Other specified counseling: Secondary | ICD-10-CM | POA: Diagnosis not present

## 2014-01-01 DIAGNOSIS — E785 Hyperlipidemia, unspecified: Secondary | ICD-10-CM | POA: Diagnosis not present

## 2014-01-01 DIAGNOSIS — Z89512 Acquired absence of left leg below knee: Secondary | ICD-10-CM | POA: Diagnosis not present

## 2014-01-01 DIAGNOSIS — I10 Essential (primary) hypertension: Secondary | ICD-10-CM | POA: Diagnosis not present

## 2014-01-01 DIAGNOSIS — K219 Gastro-esophageal reflux disease without esophagitis: Secondary | ICD-10-CM

## 2014-01-01 MED ORDER — HYDROCODONE-ACETAMINOPHEN 5-325 MG PO TABS: 1.0000 | ORAL_TABLET | Freq: Three times a day (TID) | ORAL | Status: DC | PRN

## 2014-01-01 MED ORDER — HYDRALAZINE HCL 50 MG PO TABS: 50.0000 mg | ORAL_TABLET | Freq: Two times a day (BID) | ORAL | Status: DC

## 2014-01-01 NOTE — Progress Notes (Addendum)
Patient ID: Aaron Long, male   DOB: [birth year], 73 y.o.   MRN: [id number]    Chief Complaint  Patient presents with  . Medical Management of Chronic Issues    3 month follow-up, labs completed September 2015    Allergies  Allergen Reactions  . Oxycodone Other (See Comments)    Hallucinations   HPI 73 y/o male pt is here for follow up visit. He has DM with PVD, progressing chronic renal disease, PAD, CAD, HTN among others. He has not been able to check his cbg for a month with insurance issue and not being able to get test strips. He is thus not taking novolog but injecting levemir 22 u daily. Mostly complaint with his medications. Was seen by [facility] a month back for his follow up and vascular referral was further provided for preparing pt for dialysis in future if needed. He has refused this for now and mentions today that he would not want dialysis and any other invasive modes of prolonging his life. He would like to be DNR and DNI and let nature take its course with his health. He is ok with titration of his medications. He had a mechanical fall on 11/07/13 when he fell from his wheelchair. Rib fracture was ruled out in ED. Current pain regimen is helpful. He denies any concerns this visit.  Review of Systems   Constitutional: Negative for diaphoresis, fever or chills HENT: Negative for congestion and sore throat.    Eyes: Negative for blurred vision and double vision.   Respiratory: Negative for cough, shortness of breath and wheezing.    Cardiovascular: Negative for chest pain, palpitation   Gastrointestinal: Negative for heartburn, nausea, vomiting, abdominal pain, diarrhea. Bowel movement is regular  Genitourinary: Negative for dysuria Musculoskeletal: Negative for myalgias Skin: Negative for rash.   Neurological: Negative for dizziness and headaches.   Psychiatric/Behavioral: Negative for memory loss. The patient is not nervous/anxious. no sleep disturbances  Past  Medical History  Diagnosis Date  . Prostate cancer 2010  . PAD (peripheral artery disease)   . Gangrene of toe     dry  . Neuromuscular disorder     diabetic neuropathy  . Hypertension     takes Amlodipine daily and Metoprolol bid  . Hyperlipidemia     takes Pravastatin daily  . Myocardial infarction 2005  . Peripheral neuropathy   . Constipation     takes Miralax daily  . Hemorrhoids   . Hx of colonic polyps   . History of kidney stones   . Diabetes mellitus     Novolog and Levemir daily  . Coronary artery disease   . BPH (benign prostatic hypertrophy)   . Abnormality of gait   . Coronary atherosclerosis of native coronary artery   . Phlebitis and thrombophlebitis of other deep vessels of lower extremities   . Hypertrophy of prostate without urinary obstruction and other lower urinary tract symptoms (LUTS)   . Disorder of bone and cartilage, unspecified   . Lower limb amputation, below knee   . Long term (current) use of anticoagulants   . Anemia 06/19/2012  . Thyroid disease     Secondary Hyperpara- Thyroidism, Renal  . Chronic kidney disease     Stage IV   Current Outpatient Prescriptions on File Prior to Visit  Medication Sig Dispense Refill  . AMBULATORY NON FORMULARY MEDICATION BD U/P Mini Pen Neddles 31GX5MM Use three times daily as directed DX:250.00 100 each 11  . AMBULATORY NON FORMULARY  MEDICATION One Touch Ultra 2 Test Strips Sig:  Check blood sugars Three times daily to keep blood sugars under control Dx: 250.00, 401.9 100 strip 11  . amLODipine (NORVASC) 10 MG tablet take 1 tablet by mouth once daily 90 tablet 1  . aspirin EC 81 MG tablet Take 1 tablet (81 mg total) by mouth every morning.    . clopidogrel (PLAVIX) 75 MG tablet Take 1 tablet (75 mg total) by mouth daily. 90 tablet 1  . doxazosin (CARDURA) 1 MG tablet Take 1 tablet (1 mg total) by mouth at bedtime. 90 tablet 1  . Insulin Detemir (LEVEMIR) 100 UNIT/ML Pen Inject 22 Units into the skin at  bedtime.    . Linaclotide (LINZESS) 145 MCG CAPS capsule Take 1 capsule (145 mcg total) by mouth daily. 30 capsule 3  . metoprolol succinate (TOPROL-XL) 25 MG 24 hr tablet Take 1 tablet (25 mg total) by mouth daily. 90 tablet 1  . pantoprazole (PROTONIX) 20 MG tablet Take 1 tablet (20 mg total) by mouth daily. 90 tablet 1  . pravastatin (PRAVACHOL) 20 MG tablet Take 1 tablet (20 mg total) by mouth daily. 90 tablet 3  . pregabalin (LYRICA) 75 MG capsule Take one tablet by mouth twice daily for pains 60 capsule 5  . insulin aspart (NOVOLOG) 100 UNIT/ML FlexPen Inject 5 Units into the skin 3 (three) times daily with meals. Inject 5 units three times a day with meals if blood sugar greater than 150 only, DX: 250.71     No current facility-administered medications on file prior to visit.    Physical exam BP 144/80 mmHg  Pulse 72  Temp(Src) 99 F (37.2 C) (Oral)  Resp 12  SpO2 98%  General- elderly male in no acute distress Head- atraumatic, normocephalic Eyes- PERRLA, EOMI, no pallor, no icterus Neck- no lymphadenopathy Chest- no chest wall deformities,some chest wall tenderness on right side Cardiovascular- normal s1,s2, no murmurs Respiratory- bilateral clear to auscultation, no wheeze, no rhonchi, no crackles Abdomen- bowel sounds present, soft, non tender Musculoskeletal- able to move his upper extremities, has bilateral BKA and uses a wheelchair. Upper body strength on right side is 4/5 and 5/5 on left side. Lower extremity strength 2/5   Neurological- no focal deficit Psychiatry- alert and oriented to person, place and time, normal mood and affect  Labs:  Lab Results  Component Value Date   HGBA1C 7.1* 09/26/2013   CMP Latest Ref Rng 09/26/2013 06/13/2013 04/23/2013  Glucose 65 - 99 mg/dL 107(H) 195(H) 218(H)  BUN 8 - 27 mg/dL 39(H) 36(H) 38(H)  Creatinine 0.76 - 1.27 mg/dL 3.72(H) 2.84(H) 3.17(H)  Sodium 134 - 144 mmol/L 142 142 138  Potassium 3.5 - 5.2 mmol/L 5.9(H) 5.0 5.2    Chloride 97 - 108 mmol/L 106 103 102  CO2 18 - 29 mmol/L 23 25 20   Calcium 8.6 - 10.2 mg/dL 9.5 9.0 9.8  Total Protein 6.0 - 8.5 g/dL 6.4 - -  Albumin 3.5 - 4.8 g/dL 3.9 - -  Total Bilirubin 0.0 - 1.2 mg/dL <0.2 - -  Alkaline Phos 39 - 117 IU/L 101 - -  AST 0 - 40 IU/L 12 - -  ALT 0 - 44 IU/L 10 - -   Lipid Panel     Component Value Date/Time   TRIG 158* 09/26/2013 1544   HDL 35* 09/26/2013 1544   CHOLHDL 4.8 09/26/2013 1544   LDLCALC 101* 09/26/2013 1544      Assessment/plan  1. CKD (chronic kidney  disease) stage 4, GFR 15-29 ml/min Progressive decline, does not want dialysis. Monitor clinically, no uremic symptoms at present. Avoid nsaids  2. Diabetes mellitus with peripheral vascular disease Reviewed recent a1c.continue levemir 22 u and start novolog SSI, monitor cbg, test strips and glucometer provided. Check a1c prior to next visit  3. Essential hypertension Elevated bp, increase hydralazine to 50 mg bid from 25 mg tid for compliance reason. Continue amlodipine, toprol xl current regimen  4. Gastroesophageal reflux disease, esophagitis presence not specified Continue his protonix current regimen  5. Hyperlipidemia with target LDL less than 100 Continue pravachol, check lipid prior to next visit  6. Status post bilateral below knee amputation On wheelchair, on prn norco for pain, refill provided  7. Goals of care, counseling/discussion DNR form signed, original provided to pt and copy to be scanned in system  90 days refill on all meds to be sent to his pharmacy  Patient is unable to use his leg prosthesis. He is dependent on his wheelchair for all his mobilization and daily activities. He is able to propel the wheelchair by himself.

## 2014-01-09 ENCOUNTER — Telehealth: Payer: Self-pay | Admitting: Internal Medicine

## 2014-01-09 MED ORDER — WHEELCHAIR MISC: Status: DC

## 2014-01-09 NOTE — Telephone Encounter (Signed)
Faxed Order to [facility] for new Wheelchair. Patient wife Notified.

## 2014-01-09 NOTE — Telephone Encounter (Signed)
Pt's wife called, says Mr. Mccaffrey is broke and can not be repaired.  Need an order for another wheelchair thru [employer].   Told wife, someone will return her call regarding her request..cdavis

## 2014-01-26 ENCOUNTER — Other Ambulatory Visit: Payer: Self-pay | Admitting: Internal Medicine

## 2014-01-28 ENCOUNTER — Other Ambulatory Visit: Payer: Self-pay | Admitting: *Deleted

## 2014-01-28 MED ORDER — INSULIN DETEMIR 100 UNIT/ML FLEXPEN: 22.0000 [IU] | PEN_INJECTOR | Freq: Every day | SUBCUTANEOUS | Status: DC

## 2014-01-28 MED ORDER — HYDROCODONE-ACETAMINOPHEN 5-325 MG PO TABS: 1.0000 | ORAL_TABLET | Freq: Three times a day (TID) | ORAL | Status: DC | PRN

## 2014-01-28 NOTE — Telephone Encounter (Signed)
Patient wife called and requested and will pick up

## 2014-02-09 ENCOUNTER — Other Ambulatory Visit: Payer: Self-pay | Admitting: Internal Medicine

## 2014-02-20 ENCOUNTER — Telehealth: Payer: Self-pay | Admitting: *Deleted

## 2014-02-20 DIAGNOSIS — R269 Unspecified abnormalities of gait and mobility: Secondary | ICD-10-CM

## 2014-02-20 NOTE — Telephone Encounter (Signed)
Patient wife called and left message on my voicemail, but I couldn't understand what she was needing. I called her back but left a message for her to return my call because I couldn't understand her message.

## 2014-02-20 NOTE — Telephone Encounter (Signed)
Patient wife called back and stated that patient needs Therapy because he didn't finish it out the previous time. He needs to learn how to walk more. He wants to go back to the place he went before and finish. # [phone]. She stated that she has to have a referral and diagnosis from you before they will schedule him an appointment. Please Advise.

## 2014-02-26 NOTE — Telephone Encounter (Signed)
Order Placed for referral

## 2014-02-26 NOTE — Telephone Encounter (Signed)
Ok to provide PT referral for gait training

## 2014-02-26 NOTE — Telephone Encounter (Signed)
Wife wanted Dr. Bubba Camp to address, sent message to Dr. Bubba Camp.

## 2014-02-27 ENCOUNTER — Other Ambulatory Visit: Payer: Self-pay

## 2014-02-27 MED ORDER — HYDROCODONE-ACETAMINOPHEN 5-325 MG PO TABS: 1.0000 | ORAL_TABLET | Freq: Three times a day (TID) | ORAL | Status: DC | PRN

## 2014-03-07 ENCOUNTER — Ambulatory Visit: Payer: Medicare Other | Attending: Internal Medicine | Admitting: Physical Therapy

## 2014-03-07 ENCOUNTER — Encounter: Payer: Self-pay | Admitting: Physical Therapy

## 2014-03-07 DIAGNOSIS — M24562 Contracture, left knee: Secondary | ICD-10-CM

## 2014-03-07 DIAGNOSIS — Z89612 Acquired absence of left leg above knee: Secondary | ICD-10-CM | POA: Insufficient documentation

## 2014-03-07 DIAGNOSIS — M24561 Contracture, right knee: Secondary | ICD-10-CM

## 2014-03-07 DIAGNOSIS — R269 Unspecified abnormalities of gait and mobility: Secondary | ICD-10-CM | POA: Diagnosis not present

## 2014-03-07 DIAGNOSIS — Z89511 Acquired absence of right leg below knee: Secondary | ICD-10-CM

## 2014-03-07 DIAGNOSIS — R2689 Other abnormalities of gait and mobility: Secondary | ICD-10-CM

## 2014-03-07 DIAGNOSIS — Z89512 Acquired absence of left leg below knee: Secondary | ICD-10-CM

## 2014-03-07 DIAGNOSIS — Z89611 Acquired absence of right leg above knee: Secondary | ICD-10-CM | POA: Insufficient documentation

## 2014-03-07 DIAGNOSIS — Z4781 Encounter for orthopedic aftercare following surgical amputation: Secondary | ICD-10-CM | POA: Insufficient documentation

## 2014-03-07 DIAGNOSIS — Z7409 Other reduced mobility: Secondary | ICD-10-CM

## 2014-03-07 NOTE — Therapy (Signed)
Aaron Long [address] [place], [place], [address] Phone: [phone]   Fax:  [phone]  Physical Therapy Evaluation  Patient Details  Name: Aaron Long MRN: [id number] Date of Birth: 08-11-1940 Referring Provider:  Blanchie Serve, MD  Encounter Date: 03/07/2014      PT End of Session - 03/07/14 0930    Visit Number 1   Number of Visits 17   Date for PT Re-Evaluation 05/03/14   PT Start Time 0930   PT Stop Time 1015   PT Time Calculation (min) 45 min   Equipment Utilized During Treatment Gait belt   Activity Tolerance Patient tolerated treatment well   Behavior During Therapy [facility] for tasks assessed/performed      Past Medical History  Diagnosis Date  . Prostate cancer 2010  . PAD (peripheral artery disease)   . Gangrene of toe     dry  . Neuromuscular disorder     diabetic neuropathy  . Hypertension     takes Amlodipine daily and Metoprolol bid  . Hyperlipidemia     takes Pravastatin daily  . Myocardial infarction 2005  . Peripheral neuropathy   . Constipation     takes Miralax daily  . Hemorrhoids   . Hx of colonic polyps   . History of kidney stones   . Diabetes mellitus     Novolog and Levemir daily  . Coronary artery disease   . BPH (benign prostatic hypertrophy)   . Abnormality of gait   . Coronary atherosclerosis of native coronary artery   . Phlebitis and thrombophlebitis of other deep vessels of lower extremities   . Hypertrophy of prostate without urinary obstruction and other lower urinary tract symptoms (LUTS)   . Disorder of bone and cartilage, unspecified   . Lower limb amputation, below knee   . Long term (current) use of anticoagulants   . Anemia 06/19/2012  . Thyroid disease     Secondary Hyperpara- Thyroidism, Renal  . Chronic kidney disease     Stage IV    Past Surgical History  Procedure Laterality Date  . Prostate surgery  2009    prostatectomy  . Knee  cartilage surgery  at age 36    left knee  . Cea      Right  . Coronary artery bypass graft  2005    x 4  . Carotid endarterectomy  2005    right  . Cataract surgery  2011    bilateral  . Colonoscopy    . Cardiac catheterization  05/28/11  . Amputation  06/14/2011    Procedure: AMPUTATION DIGIT;  Surgeon: Aaron Dutch, MD;  Location: [facility] OR;  Service: Vascular;  Laterality: Left;  Amputation Left fifth toe  . Amputation  06/16/2011    Procedure: AMPUTATION BELOW KNEE;  Surgeon: Aaron Dutch, MD;  Location: [place];  Service: Vascular;  Laterality: Left;  . I&d extremity  01/31/2012    Procedure: IRRIGATION AND DEBRIDEMENT EXTREMITY;  Surgeon: Aaron Dutch, MD;  Location: [place];  Service: Vascular;  Laterality: Left;  I & D Left BKA   . Amputation Right 06/12/2012    Procedure: AMPUTATION DIGIT;  Surgeon: Aaron Dutch, MD;  Location: [facility] OR;  Service: Vascular;  Laterality: Right;  GREAT TOE  . Below knee leg amputation Right 08/08/2012    Dr Aaron Long  . Amputation Right 08/08/2012    Procedure: AMPUTATION BELOW KNEE;  Surgeon: Aaron Dutch, MD;  Location: [facility]  OR;  Service: Vascular;  Laterality: Right;  . Abdominal aortagram N/A 05/28/2011    Procedure: ABDOMINAL AORTAGRAM;  Surgeon: Aaron Dutch, MD;  Location: [facility] CATH LAB;  Service: Cardiovascular;  Laterality: N/A;  . Abdominal aortagram N/A 06/02/2012    Procedure: ABDOMINAL Maxcine Ham;  Surgeon: Aaron Dutch, MD;  Location: [facility] CATH LAB;  Service: Cardiovascular;  Laterality: N/A;  . Abdominal aortagram N/A 06/09/2012    Procedure: ABDOMINAL Maxcine Ham;  Surgeon: Aaron Dutch, MD;  Location: [facility] CATH LAB;  Service: Cardiovascular;  Laterality: N/A;    There were no vitals taken for this visit.  Visit Diagnosis:  Abnormality of gait  Impaired functional mobility and activity tolerance  Balance problems  Contractures of both knees  Status post bilateral below knee amputation      Subjective Assessment -  03/07/14 0940    Symptoms this 74yo male underwent left Transtibial Amputation 06/14/11 and right Transtibial Amputation 08/09/11. His right prosthesis is still original recieved in 2014. Left had socket revision in 2014. He developed bursitis in right shoulder that limited prosthetic gait with RW. He presents to PT for evaluation to work on prosthetic gait.                    Patient Stated Goals Wants to use prostheses for household & community mobility.   Currently in Pain? No/denies          [facility] PT Assessment - 03/07/14 0930    Assessment   Medical Diagnosis Bilateral Transtibial Amputations   Precautions   Precautions Fall   Restrictions   Weight Bearing Restrictions No   Balance Screen   Has the patient fallen in the past 6 months No   Has the patient had a decrease in activity level because of a fear of falling?  No   Is the patient reluctant to leave their home because of a fear of falling?  No   Home Environment   Living Enviornment Private residence   Living Arrangements Spouse/significant other;Other (Comment)  35yo nephew   Type of Home Apartment   Home Access Level entry  if parks in closet spot has to negotiate a curb   Home Layout One level   [place] - 2 wheels;Bedside commode;Tub bench;Wheelchair - manual   Prior Function   Level of Independence Independent with basic ADLs;Independent with homemaking with ambulation;Independent with gait;Independent with transfers  under PT for first amputaton with RW with SBA,    Vocation Retired   Associate Professor   Overall Cognitive Status Within Functional Limits for tasks assessed   Posture/Postural Control   Posture/Postural Control Postural limitations   Postural Limitations Rounded Shoulders;Forward head;Increased lumbar lordosis;Flexed trunk  wide base   ROM / Strength   AROM / PROM / Strength AROM;Strength   AROM   Overall AROM  Deficits   Overall AROM Comments hip flexors appear tight   Right Knee Extension  -13   Left Knee Extension -18   Strength   Overall Strength Deficits   Overall Strength Comments hips 3/5 to 4/5   Transfers   Sit to Stand 3: Mod assist;With upper extremity assist;With armrests;From chair/3-in-1  pulling on parallel bars   Stand to Sit 4: Min assist;With upper extremity assist;With armrests;To chair/3-in-1  using parallel bars   Ambulation/Gait   Ambulation/Gait Yes   Ambulation/Gait Assistance 4: Min assist   Ambulation Distance (Feet) 5 Feet   Assistive device Parallel bars;Prostheses   Gait Pattern Step-to pattern;Decreased step length -  right;Decreased step length - left;Decreased stride length;Decreased hip/knee flexion - right;Decreased hip/knee flexion - left;Right hip hike;Left hip hike;Right circumduction;Left circumduction;Right flexed knee in stance;Left flexed knee in stance;Trunk rotated posteriorly on left;Wide base of support   Ambulation Surface Level;Indoor   Static Standing Balance   Static Standing - Balance Support Bilateral upper extremity supported  parallel bars   Static Standing - Level of Assistance 4: Min assist   Static Standing - Comment/# of Minutes 2 minutes   Dynamic Standing Balance   Dynamic Standing - Balance Support Bilateral upper extremity supported  parallel bars   Dynamic Standing - Level of Assistance 4: Min assist   Dynamic Standing - Balance Activities Head nods;Head turns;Reaching for objects  sliding single UE 5" along bar         Prosthetics Assessment - 03/07/14 0001    Prosthetics   Prosthetic Care Dependent with Residual limb care;Skin check;Prosthetic cleaning;Ply sock cleaning;Proper wear schedule/adjustment;Proper weight-bearing schedule/adjustment;Correct ply sock adjustment  donning   Donning prosthesis  Min assist   Doffing prosthesis  Supervision   Current prosthetic wear tolerance (days/week)  worn prostheses 0-3 days /week for last 4 weeks   Current prosthetic wear tolerance (#hours/day)  0 to all awake  hours   Current prosthetic weight-bearing tolerance (hours/day)  2 minutes                 [facility] Adult PT Treatment/Exercise - 03/07/14 0001    Prosthetics   Education Provided Skin check;Residual limb care;Prosthetic cleaning;Correct ply sock adjustment;Proper wear schedule/adjustment;Proper weight-bearing schedule/adjustment  donning, wear 4 hours 2 x/day   Person(s) Educated Patient;Spouse   Education Method Explanation;Demonstration   Education Method Verbalized understanding;Returned demonstration;Needs further instruction                PT Education - 03/07/14 0930    Education provided Yes   Education Details see prosthetic instruction   Person(s) Educated Patient;Spouse   Methods Explanation;Demonstration   Comprehension Verbalized understanding;Returned demonstration;Need further instruction          PT Short Term Goals - 03/07/14 0930    PT SHORT TERM GOAL #1   Title tolerates wear >10 hours /day without skin issues or pain (Target Date: 04/05/14)   Time 4   Period Weeks   Status New   PT SHORT TERM GOAL #2   Title donnes prostheses correctly independently (Target Date: 04/05/14)   Time 4   Period Weeks   Status New   PT SHORT TERM GOAL #3   Title sit to/from stand w/c to RW with supervision. (Target Date: 04/05/14)   Time 4   Period Weeks   Status New   PT SHORT TERM GOAL #4   Title ambulates 20' with RW & prostheses with minimal assist. (Target Date: 04/05/14)   Time 4   Period Weeks   Status New           PT Long Term Goals - 03/07/14 0930    PT LONG TERM GOAL #1   Title independent prosthetic care correctly. (Target Date: 05/03/14)   Time 8   Period Weeks   Status New   PT LONG TERM GOAL #2   Title wears prostheses >80% of awake hours daily without skin or pain issues.  (Target Date: 05/03/14)   Time 8   Period Weeks   Status New   PT LONG TERM GOAL #3   Title ambulates 100' with RW & prostheses modified independent including  sit to/from stand.  (Target  Date: 05/03/14)   Time 8   Period Weeks   Status New   PT LONG TERM GOAL #4   Title reaches 10" & manages clothes in standing with RW support modified independent.  (Target Date: 05/03/14)   Time 8   Period Weeks   Status New               Plan - 2014/03/20 0930    Clinical Impression Statement This 74yo male was undergoing prosthetic training with bilateral TTA prostheses when he developed shoulder bursitis limiting standing & gait due excessive UE use on RW. Patient is dependent in standing and gait with prostheses/             Pt will benefit from skilled therapeutic intervention in order to improve on the following deficits Abnormal gait;Decreased balance;Decreased activity tolerance;Decreased endurance;Decreased knowledge of use of DME;Decreased mobility;Decreased range of motion;Decreased strength   Rehab Potential Good   PT Frequency 2x / week   PT Duration 8 weeks   PT Treatment/Interventions ADLs/Self Care Home Management;DME Instruction;Gait training;Stair training;Functional mobility training;Therapeutic activities;Therapeutic exercise;Balance training;Neuromuscular re-education   PT Next Visit Plan standing at sink, review wear   PT Home Exercise Plan balance / standing at sink   Consulted and Agree with Plan of Care Patient;Family member/caregiver   Family Member Consulted wife          G-Codes - 2014-03-20 0930    Functional Assessment Tool Used wears prostheses 0-3 days /wk for last 4 weeks for 0 to most of awake hours   Functional Limitation Self care   Self Care Current Status (S9702) At least 80 percent but less than 100 percent impaired, limited or restricted   Self Care Goal Status (O3785) At least 20 percent but less than 40 percent impaired, limited or restricted       Problem List Patient Active Problem List   Diagnosis Date Noted  . Other constipation 09/26/2013  . Cervical disc disorder with radiculopathy of cervical  region 08/07/2013  . Drug-induced constipation 06/13/2013  . DM type 2, uncontrolled, with renal complications 88/50/2774  . Hypertensive renal disease 06/13/2013  . Encounter for therapeutic drug monitoring 06/13/2013  . DM type 2, uncontrolled, with neuropathy 01/23/2013  . Hyperlipidemia with target LDL less than 100 12/06/2012  . Need for prophylactic vaccination and inoculation against influenza 09/30/2012  . Diabetes mellitus with peripheral vascular disease 09/06/2012  . GERD (gastroesophageal reflux disease) 09/06/2012  . Aftercare following surgery of the circulatory system, [place] 08/03/2012  . Anemia in chronic kidney disease(285.21) 07/24/2012  . Atherosclerosis of native arteries of the extremities with ulceration(440.23) 06/01/2012  . Hyperkalemia 04/24/2012  . Diabetic neuropathy, type II diabetes mellitus 07/27/2011  . Atherosclerosis of native arteries of the extremities with intermittent claudication 07/08/2011  . S/P BKA (below knee amputation) 06/22/2011  . Atherosclerosis of native arteries of the extremities with gangrene 06/10/2011  . CKD (chronic kidney disease) stage 4, GFR 15-29 ml/min 05/02/2011  . HTN (hypertension) 04/28/2011  . CAD (coronary artery disease) 04/28/2011  . PAD (peripheral artery disease) 04/28/2011  . Prostate cancer 04/28/2011    Jamey Reas PT, DPT 03/20/2014, 11:12 PM  [facility] [address] [place], [place], [address] Phone: [phone]   Fax:  [phone]

## 2014-03-15 ENCOUNTER — Ambulatory Visit: Payer: Medicare Other | Attending: Internal Medicine | Admitting: Physical Therapy

## 2014-03-15 ENCOUNTER — Encounter: Payer: Self-pay | Admitting: Physical Therapy

## 2014-03-15 DIAGNOSIS — Z89611 Acquired absence of right leg above knee: Secondary | ICD-10-CM | POA: Diagnosis not present

## 2014-03-15 DIAGNOSIS — Z89511 Acquired absence of right leg below knee: Secondary | ICD-10-CM

## 2014-03-15 DIAGNOSIS — R269 Unspecified abnormalities of gait and mobility: Secondary | ICD-10-CM | POA: Diagnosis not present

## 2014-03-15 DIAGNOSIS — R2689 Other abnormalities of gait and mobility: Secondary | ICD-10-CM

## 2014-03-15 DIAGNOSIS — Z7409 Other reduced mobility: Secondary | ICD-10-CM

## 2014-03-15 DIAGNOSIS — Z89612 Acquired absence of left leg above knee: Secondary | ICD-10-CM | POA: Insufficient documentation

## 2014-03-15 DIAGNOSIS — Z4781 Encounter for orthopedic aftercare following surgical amputation: Secondary | ICD-10-CM | POA: Diagnosis not present

## 2014-03-15 DIAGNOSIS — Z89512 Acquired absence of left leg below knee: Secondary | ICD-10-CM

## 2014-03-15 NOTE — Therapy (Signed)
[facility] [address] [place], [place], [address] Phone: [phone]   Fax:  [phone]  Physical Therapy Treatment  Patient Details  Name: Aaron Long MRN: [id number] Date of Birth: 07/15/1940 Referring Provider:  Blanchie Serve, MD  Encounter Date: 03/15/2014      PT End of Session - 03/15/14 0845    Visit Number 2   Number of Visits 17   Date for PT Re-Evaluation 05/03/14   PT Start Time 0845   PT Stop Time 0930   PT Time Calculation (min) 45 min   Equipment Utilized During Treatment Gait belt   Activity Tolerance Patient tolerated treatment well   Behavior During Therapy [facility] for tasks assessed/performed      Past Medical History  Diagnosis Date  . Prostate cancer 2010  . PAD (peripheral artery disease)   . Gangrene of toe     dry  . Neuromuscular disorder     diabetic neuropathy  . Hypertension     takes Amlodipine daily and Metoprolol bid  . Hyperlipidemia     takes Pravastatin daily  . Myocardial infarction 2005  . Peripheral neuropathy   . Constipation     takes Miralax daily  . Hemorrhoids   . Hx of colonic polyps   . History of kidney stones   . Diabetes mellitus     Novolog and Levemir daily  . Coronary artery disease   . BPH (benign prostatic hypertrophy)   . Abnormality of gait   . Coronary atherosclerosis of native coronary artery   . Phlebitis and thrombophlebitis of other deep vessels of lower extremities   . Hypertrophy of prostate without urinary obstruction and other lower urinary tract symptoms (LUTS)   . Disorder of bone and cartilage, unspecified   . Lower limb amputation, below knee   . Long term (current) use of anticoagulants   . Anemia 06/19/2012  . Thyroid disease     Secondary Hyperpara- Thyroidism, Renal  . Chronic kidney disease     Stage IV    Past Surgical History  Procedure Laterality Date  . Prostate surgery  2009    prostatectomy  . Knee cartilage  surgery  at age 25    left knee  . Cea      Right  . Coronary artery bypass graft  2005    x 4  . Carotid endarterectomy  2005    right  . Cataract surgery  2011    bilateral  . Colonoscopy    . Cardiac catheterization  05/28/11  . Amputation  06/14/2011    Procedure: AMPUTATION DIGIT;  Surgeon: Elam Dutch, MD;  Location: [facility] OR;  Service: Vascular;  Laterality: Left;  Amputation Left fifth toe  . Amputation  06/16/2011    Procedure: AMPUTATION BELOW KNEE;  Surgeon: Elam Dutch, MD;  Location: [place];  Service: Vascular;  Laterality: Left;  . I&d extremity  01/31/2012    Procedure: IRRIGATION AND DEBRIDEMENT EXTREMITY;  Surgeon: Elam Dutch, MD;  Location: [place];  Service: Vascular;  Laterality: Left;  I & D Left BKA   . Amputation Right 06/12/2012    Procedure: AMPUTATION DIGIT;  Surgeon: Elam Dutch, MD;  Location: [facility] OR;  Service: Vascular;  Laterality: Right;  GREAT TOE  . Below knee leg amputation Right 08/08/2012    Dr Oneida Alar  . Amputation Right 08/08/2012    Procedure: AMPUTATION BELOW KNEE;  Surgeon: Elam Dutch, MD;  Location: [facility]  OR;  Service: Vascular;  Laterality: Right;  . Abdominal aortagram N/A 05/28/2011    Procedure: ABDOMINAL AORTAGRAM;  Surgeon: Elam Dutch, MD;  Location: [facility] CATH LAB;  Service: Cardiovascular;  Laterality: N/A;  . Abdominal aortagram N/A 06/02/2012    Procedure: ABDOMINAL Maxcine Ham;  Surgeon: Elam Dutch, MD;  Location: [facility] CATH LAB;  Service: Cardiovascular;  Laterality: N/A;  . Abdominal aortagram N/A 06/09/2012    Procedure: ABDOMINAL Maxcine Ham;  Surgeon: Elam Dutch, MD;  Location: [facility] CATH LAB;  Service: Cardiovascular;  Laterality: N/A;    There were no vitals taken for this visit.  Visit Diagnosis:  Impaired functional mobility and activity tolerance  Balance problems  Status post bilateral below knee amputation      Subjective Assessment - 03/15/14 0858    Symptoms No falls. Wearing prostheses 4 hours  2x/day daily as advised with no issues.   Currently in Pain? No/denies     Prosthetic Training: PT instructed to increase wear to 5 hours 2x/day drying limb & liner ~1/2 way - donning in morning with arising and doffing 2nd wear with getting in bath. Patient verbalized understanding. Push-up off w/c to raise buttocks, initial step to standing up: 10 reps Sit to stand w/c & chair with armrests to sink with verbal cues and modA. Stood 30sec 5 reps and 60 sec 3 reps. Stand to sit with minA / verbal cues. Weight shifts right/center/left/center, ant/center/ post/ center, reaching with right & left UE across midline with erecting posture b/w each cone, reaching with dominant UE (right) to lower shelf overhead 3 reps, Patient required min guard. Not ready as HEP yet due to assist level. HEP of push-up to lift buttocks 10-15 reps 2-3 times per day and leg press against wall 20 reps 5 sec hold 4 sets each leg. Patient verbalized and return demo understanding.                        PT Education - 03/15/14 0845    Education provided Yes   Education Details HEP initially push-up off w/c armrests only lifting buttocks and forward wt shift, leg press against wall seated in w/c   Person(s) Educated Patient   Methods Explanation;Demonstration   Comprehension Verbalized understanding;Returned demonstration          PT Short Term Goals - 03/07/14 0930    PT SHORT TERM GOAL #1   Title tolerates wear >10 hours /day without skin issues or pain (Target Date: 04/05/14)   Time 4   Period Weeks   Status New   PT SHORT TERM GOAL #2   Title donnes prostheses correctly independently (Target Date: 04/05/14)   Time 4   Period Weeks   Status New   PT SHORT TERM GOAL #3   Title sit to/from stand w/c to RW with supervision. (Target Date: 04/05/14)   Time 4   Period Weeks   Status New   PT SHORT TERM GOAL #4   Title ambulates 20' with RW & prostheses with minimal assist. (Target Date: 04/05/14)    Time 4   Period Weeks   Status New           PT Long Term Goals - 03/07/14 0930    PT LONG TERM GOAL #1   Title independent prosthetic care correctly. (Target Date: 05/03/14)   Time 8   Period Weeks   Status New   PT LONG TERM GOAL #2   Title wears prostheses >80%  of awake hours daily without skin or pain issues.  (Target Date: 05/03/14)   Time 8   Period Weeks   Status New   PT LONG TERM GOAL #3   Title ambulates 100' with RW & prostheses modified independent including sit to/from stand.  (Target Date: 05/03/14)   Time 8   Period Weeks   Status New   PT LONG TERM GOAL #4   Title reaches 10" & manages clothes in standing with RW support modified independent.  (Target Date: 05/03/14)   Time 8   Period Weeks   Status New               Plan - 03/15/14 0845    Clinical Impression Statement Patient is not safe to stand at home yet due to assistance level. He improved standing balance with each stand.   Pt will benefit from skilled therapeutic intervention in order to improve on the following deficits Abnormal gait;Decreased balance;Decreased activity tolerance;Decreased endurance;Decreased knowledge of use of DME;Decreased mobility;Decreased range of motion;Decreased strength   Rehab Potential Good   PT Frequency 2x / week   PT Duration 8 weeks   PT Treatment/Interventions ADLs/Self Care Home Management;DME Instruction;Gait training;Stair training;Functional mobility training;Therapeutic activities;Therapeutic exercise;Balance training;Neuromuscular re-education   PT Next Visit Plan standing at sink, review wear   PT Home Exercise Plan balance / standing at sink   Consulted and Agree with Plan of Care Patient;Family member/caregiver   Family Member Consulted wife        Problem List Patient Active Problem List   Diagnosis Date Noted  . Other constipation 09/26/2013  . Cervical disc disorder with radiculopathy of cervical region 08/07/2013  . Drug-induced  constipation 06/13/2013  . DM type 2, uncontrolled, with renal complications 14/97/0263  . Hypertensive renal disease 06/13/2013  . Encounter for therapeutic drug monitoring 06/13/2013  . DM type 2, uncontrolled, with neuropathy 01/23/2013  . Hyperlipidemia with target LDL less than 100 12/06/2012  . Need for prophylactic vaccination and inoculation against influenza 09/30/2012  . Diabetes mellitus with peripheral vascular disease 09/06/2012  . GERD (gastroesophageal reflux disease) 09/06/2012  . Aftercare following surgery of the circulatory system, [place] 08/03/2012  . Anemia in chronic kidney disease(285.21) 07/24/2012  . Atherosclerosis of native arteries of the extremities with ulceration(440.23) 06/01/2012  . Hyperkalemia 04/24/2012  . Diabetic neuropathy, type II diabetes mellitus 07/27/2011  . Atherosclerosis of native arteries of the extremities with intermittent claudication 07/08/2011  . S/P BKA (below knee amputation) 06/22/2011  . Atherosclerosis of native arteries of the extremities with gangrene 06/10/2011  . CKD (chronic kidney disease) stage 4, GFR 15-29 ml/min 05/02/2011  . HTN (hypertension) 04/28/2011  . CAD (coronary artery disease) 04/28/2011  . PAD (peripheral artery disease) 04/28/2011  . Prostate cancer 04/28/2011    Jamey Reas PT, DPT 03/15/2014, 4:41 PM  [facility] [address] [place], [place], [address] Phone: [phone]   Fax:  [phone]

## 2014-03-18 ENCOUNTER — Encounter: Payer: Self-pay | Admitting: Physical Therapy

## 2014-03-18 ENCOUNTER — Ambulatory Visit: Payer: Medicare Other | Admitting: Physical Therapy

## 2014-03-18 DIAGNOSIS — Z89512 Acquired absence of left leg below knee: Secondary | ICD-10-CM

## 2014-03-18 DIAGNOSIS — Z89611 Acquired absence of right leg above knee: Secondary | ICD-10-CM | POA: Diagnosis not present

## 2014-03-18 DIAGNOSIS — Z4781 Encounter for orthopedic aftercare following surgical amputation: Secondary | ICD-10-CM | POA: Diagnosis not present

## 2014-03-18 DIAGNOSIS — R269 Unspecified abnormalities of gait and mobility: Secondary | ICD-10-CM | POA: Diagnosis not present

## 2014-03-18 DIAGNOSIS — Z89511 Acquired absence of right leg below knee: Secondary | ICD-10-CM

## 2014-03-18 DIAGNOSIS — Z7409 Other reduced mobility: Secondary | ICD-10-CM

## 2014-03-18 DIAGNOSIS — R2689 Other abnormalities of gait and mobility: Secondary | ICD-10-CM

## 2014-03-18 DIAGNOSIS — Z89612 Acquired absence of left leg above knee: Secondary | ICD-10-CM | POA: Diagnosis not present

## 2014-03-18 NOTE — Therapy (Signed)
[facility] [address] [place], [place], [address] Phone: [phone]   Fax:  [phone]  Physical Therapy Treatment  Patient Details  Name: Aaron Long MRN: [id number] Date of Birth: Dec 09, 1940 Referring Provider:  Blanchie Serve, MD  Encounter Date: 03/18/2014      PT End of Session - 03/18/14 1423    Visit Number 3   Number of Visits 17   Date for PT Re-Evaluation 05/03/14   PT Start Time 7425   PT Stop Time 1230   PT Time Calculation (min) 45 min   Equipment Utilized During Treatment Gait belt   Activity Tolerance Patient tolerated treatment well   Behavior During Therapy [facility] for tasks assessed/performed      Past Medical History  Diagnosis Date  . Prostate cancer 2010  . PAD (peripheral artery disease)   . Gangrene of toe     dry  . Neuromuscular disorder     diabetic neuropathy  . Hypertension     takes Amlodipine daily and Metoprolol bid  . Hyperlipidemia     takes Pravastatin daily  . Myocardial infarction 2005  . Peripheral neuropathy   . Constipation     takes Miralax daily  . Hemorrhoids   . Hx of colonic polyps   . History of kidney stones   . Diabetes mellitus     Novolog and Levemir daily  . Coronary artery disease   . BPH (benign prostatic hypertrophy)   . Abnormality of gait   . Coronary atherosclerosis of native coronary artery   . Phlebitis and thrombophlebitis of other deep vessels of lower extremities   . Hypertrophy of prostate without urinary obstruction and other lower urinary tract symptoms (LUTS)   . Disorder of bone and cartilage, unspecified   . Lower limb amputation, below knee   . Long term (current) use of anticoagulants   . Anemia 06/19/2012  . Thyroid disease     Secondary Hyperpara- Thyroidism, Renal  . Chronic kidney disease     Stage IV    Past Surgical History  Procedure Laterality Date  . Prostate surgery  2009    prostatectomy  . Knee cartilage  surgery  at age 27    left knee  . Cea      Right  . Coronary artery bypass graft  2005    x 4  . Carotid endarterectomy  2005    right  . Cataract surgery  2011    bilateral  . Colonoscopy    . Cardiac catheterization  05/28/11  . Amputation  06/14/2011    Procedure: AMPUTATION DIGIT;  Surgeon: Elam Dutch, MD;  Location: [facility] OR;  Service: Vascular;  Laterality: Left;  Amputation Left fifth toe  . Amputation  06/16/2011    Procedure: AMPUTATION BELOW KNEE;  Surgeon: Elam Dutch, MD;  Location: [facility];  Service: Vascular;  Laterality: Left;  . I&d extremity  01/31/2012    Procedure: IRRIGATION AND DEBRIDEMENT EXTREMITY;  Surgeon: Elam Dutch, MD;  Location: [place];  Service: Vascular;  Laterality: Left;  I & D Left BKA   . Amputation Right 06/12/2012    Procedure: AMPUTATION DIGIT;  Surgeon: Elam Dutch, MD;  Location: [facility] OR;  Service: Vascular;  Laterality: Right;  GREAT TOE  . Below knee leg amputation Right 08/08/2012    Dr Oneida Alar  . Amputation Right 08/08/2012    Procedure: AMPUTATION BELOW KNEE;  Surgeon: Elam Dutch, MD;  Location: [facility]  OR;  Service: Vascular;  Laterality: Right;  . Abdominal aortagram N/A 05/28/2011    Procedure: ABDOMINAL AORTAGRAM;  Surgeon: Elam Dutch, MD;  Location: [facility] CATH LAB;  Service: Cardiovascular;  Laterality: N/A;  . Abdominal aortagram N/A 06/02/2012    Procedure: ABDOMINAL Maxcine Ham;  Surgeon: Elam Dutch, MD;  Location: [facility] CATH LAB;  Service: Cardiovascular;  Laterality: N/A;  . Abdominal aortagram N/A 06/09/2012    Procedure: ABDOMINAL Maxcine Ham;  Surgeon: Elam Dutch, MD;  Location: [facility] CATH LAB;  Service: Cardiovascular;  Laterality: N/A;    There were no vitals taken for this visit.  Visit Diagnosis:  Impaired functional mobility and activity tolerance  Balance problems  Status post bilateral below knee amputation      Subjective Assessment - 03/18/14 1212    Symptoms Did exercises as asked. He was tired  after last session but it felt good to work.   Currently in Pain? No/denies     Therapeutic Exercise: With knees supported on chair against wall and pushing on chair armrests: patient able to lift buttocks until knees are extended 10 reps with minA. See patient instructions for exercises at sink. Sit to stand initially with min A progressed to SBA. Stand to sit with initial min Guard progressed to SBA.                      PT Education - 03/18/14 1422    Education provided Yes   Education Details HEP at sink but only under supervision of wife, sit to/from stand   Person(s) Educated Patient   Methods Explanation;Demonstration;Handout   Comprehension Verbalized understanding;Returned demonstration;Need further instruction          PT Short Term Goals - 03/07/14 0930    PT SHORT TERM GOAL #1   Title tolerates wear >10 hours /day without skin issues or pain (Target Date: 04/05/14)   Time 4   Period Weeks   Status New   PT SHORT TERM GOAL #2   Title donnes prostheses correctly independently (Target Date: 04/05/14)   Time 4   Period Weeks   Status New   PT SHORT TERM GOAL #3   Title sit to/from stand w/c to RW with supervision. (Target Date: 04/05/14)   Time 4   Period Weeks   Status New   PT SHORT TERM GOAL #4   Title ambulates 20' with RW & prostheses with minimal assist. (Target Date: 04/05/14)   Time 4   Period Weeks   Status New           PT Long Term Goals - 03/07/14 0930    PT LONG TERM GOAL #1   Title independent prosthetic care correctly. (Target Date: 05/03/14)   Time 8   Period Weeks   Status New   PT LONG TERM GOAL #2   Title wears prostheses >80% of awake hours daily without skin or pain issues.  (Target Date: 05/03/14)   Time 8   Period Weeks   Status New   PT LONG TERM GOAL #3   Title ambulates 100' with RW & prostheses modified independent including sit to/from stand.  (Target Date: 05/03/14)   Time 8   Period Weeks   Status New   PT  LONG TERM GOAL #4   Title reaches 10" & manages clothes in standing with RW support modified independent.  (Target Date: 05/03/14)   Time 8   Period Weeks   Status New  Plan - 03/18/14 1424    Clinical Impression Statement Patient appears safe to stand at sink with wife to improve standing tolerance and balance.   Pt will benefit from skilled therapeutic intervention in order to improve on the following deficits Abnormal gait;Decreased balance;Decreased activity tolerance;Decreased endurance;Decreased knowledge of use of DME;Decreased mobility;Decreased range of motion;Decreased strength   Rehab Potential Good   PT Frequency 2x / week   PT Duration 8 weeks   PT Treatment/Interventions ADLs/Self Care Home Management;DME Instruction;Gait training;Stair training;Functional mobility training;Therapeutic activities;Therapeutic exercise;Balance training;Neuromuscular re-education   PT Next Visit Plan standing at sink, review wear   PT Home Exercise Plan review, balance / standing at sink   Consulted and Agree with Plan of Care Patient;Family member/caregiver   Family Member Consulted wife        Problem List Patient Active Problem List   Diagnosis Date Noted  . Other constipation 09/26/2013  . Cervical disc disorder with radiculopathy of cervical region 08/07/2013  . Drug-induced constipation 06/13/2013  . DM type 2, uncontrolled, with renal complications 54/98/2641  . Hypertensive renal disease 06/13/2013  . Encounter for therapeutic drug monitoring 06/13/2013  . DM type 2, uncontrolled, with neuropathy 01/23/2013  . Hyperlipidemia with target LDL less than 100 12/06/2012  . Need for prophylactic vaccination and inoculation against influenza 09/30/2012  . Diabetes mellitus with peripheral vascular disease 09/06/2012  . GERD (gastroesophageal reflux disease) 09/06/2012  . Aftercare following surgery of the circulatory system, [place] 08/03/2012  . Anemia in chronic  kidney disease(285.21) 07/24/2012  . Atherosclerosis of native arteries of the extremities with ulceration(440.23) 06/01/2012  . Hyperkalemia 04/24/2012  . Diabetic neuropathy, type II diabetes mellitus 07/27/2011  . Atherosclerosis of native arteries of the extremities with intermittent claudication 07/08/2011  . S/P BKA (below knee amputation) 06/22/2011  . Atherosclerosis of native arteries of the extremities with gangrene 06/10/2011  . CKD (chronic kidney disease) stage 4, GFR 15-29 ml/min 05/02/2011  . HTN (hypertension) 04/28/2011  . CAD (coronary artery disease) 04/28/2011  . PAD (peripheral artery disease) 04/28/2011  . Prostate cancer 04/28/2011    Jamey Reas PT, DPT 03/18/2014, 2:26 PM  [facility] [address] [place], [place], [address] Phone: [phone]   Fax:  [phone]

## 2014-03-18 NOTE — Patient Instructions (Signed)
Do each exercise 1  times per day Do each exercise 5-10 repetitions Hold each exercise for 2-4 seconds to feel your location  AT [place].  USE TAPE ON FLOOR TO MARK THE MIDLINE POSITION. You also should try to feel with your limb pressure in socket.  You are trying to feel with limb what you used to feel with the bottom of your foot.   Standing Up: A. Scoot bottom to edge of chair. Bend feet back but not under the chair. B. Push with hands on chair forward. Think head butt wall or look into sink.  C.Reach hands to sink one at a time. D. Push upward with knees, hips & back to stand up straight.  Sitting Down: A. BOW first keeping weight over your feet. B. Reach hands for chair one at a time. C. Lower your buttocks to chair controlled.  1. Side to Side Shift: Moving your hips only (not shoulders): move weight onto your left leg, HOLD/FEEL.  Move back to equal weight on each leg, HOLD/FEEL. Move weight onto your right leg, HOLD/FEEL. Move back to equal weight on each leg, HOLD/FEEL. Repeat. 2. Front to Back Shift: Moving your hips only (not shoulders): move your weight forward onto your toes, HOLD/FEEL. Move your weight back to equal Flat Foot on both legs, HOLD/FEEL. Move your weight back onto your heels, HOLD/FEEL. Move your weight back to equal on both legs, HOLD/FEEL. Repeat. 3. Moving Cones / Cups: With equal weight on each leg: Hold on with one hand the first time, then progress to no hand supports. Move cups from one side of sink to the other. Place cups ~2" out of your reach, progress to 10" beyond reach. 4. Overhead/Upward Reaching: alternated reaching up to top cabinets or ceiling if no cabinets present. Keep equal weight on each leg. Keep one hand support on counter while other hand reaches. 5.   Looking Over Shoulders: With equal weight on each leg: alternate turning to look\ over your shoulders with one hand  support on counter as needed. Shift weight to             side looking, pull hip then shoulder then head/eyes around to look behind you. Keep one hand support.  6.Place both feet in chair that is against wall. Press bottom of feet against chairback. Alternate legs as if walking.

## 2014-03-20 ENCOUNTER — Ambulatory Visit: Payer: Medicare Other | Admitting: Physical Therapy

## 2014-03-20 ENCOUNTER — Encounter: Payer: Self-pay | Admitting: Physical Therapy

## 2014-03-20 DIAGNOSIS — Z4781 Encounter for orthopedic aftercare following surgical amputation: Secondary | ICD-10-CM | POA: Diagnosis not present

## 2014-03-20 DIAGNOSIS — R269 Unspecified abnormalities of gait and mobility: Secondary | ICD-10-CM | POA: Diagnosis not present

## 2014-03-20 DIAGNOSIS — Z89611 Acquired absence of right leg above knee: Secondary | ICD-10-CM | POA: Diagnosis not present

## 2014-03-20 DIAGNOSIS — R2689 Other abnormalities of gait and mobility: Secondary | ICD-10-CM

## 2014-03-20 DIAGNOSIS — Z7409 Other reduced mobility: Secondary | ICD-10-CM

## 2014-03-20 DIAGNOSIS — Z89612 Acquired absence of left leg above knee: Secondary | ICD-10-CM | POA: Diagnosis not present

## 2014-03-20 NOTE — Therapy (Signed)
[facility] [address] [place], [place], [address] Phone: [phone]   Fax:  [phone]  Physical Therapy Treatment  Patient Details  Name: Aaron Long MRN: [id number] Date of Birth: 1940-03-14 Referring Provider:  Blanchie Serve, MD  Encounter Date: 03/20/2014      PT End of Session - 03/20/14 1109    Visit Number 4   Number of Visits 17   Date for PT Re-Evaluation 05/03/14   PT Start Time 1103   PT Stop Time 1145   PT Time Calculation (min) 42 min   Equipment Utilized During Treatment Gait belt   Activity Tolerance Patient tolerated treatment well   Behavior During Therapy [facility] for tasks assessed/performed      Past Medical History  Diagnosis Date  . Prostate cancer 2010  . PAD (peripheral artery disease)   . Gangrene of toe     dry  . Neuromuscular disorder     diabetic neuropathy  . Hypertension     takes Amlodipine daily and Metoprolol bid  . Hyperlipidemia     takes Pravastatin daily  . Myocardial infarction 2005  . Peripheral neuropathy   . Constipation     takes Miralax daily  . Hemorrhoids   . Hx of colonic polyps   . History of kidney stones   . Diabetes mellitus     Novolog and Levemir daily  . Coronary artery disease   . BPH (benign prostatic hypertrophy)   . Abnormality of gait   . Coronary atherosclerosis of native coronary artery   . Phlebitis and thrombophlebitis of other deep vessels of lower extremities   . Hypertrophy of prostate without urinary obstruction and other lower urinary tract symptoms (LUTS)   . Disorder of bone and cartilage, unspecified   . Lower limb amputation, below knee   . Long term (current) use of anticoagulants   . Anemia 06/19/2012  . Thyroid disease     Secondary Hyperpara- Thyroidism, Renal  . Chronic kidney disease     Stage IV    Past Surgical History  Procedure Laterality Date  . Prostate surgery  2009    prostatectomy  . Knee cartilage  surgery  at age 61    left knee  . Cea      Right  . Coronary artery bypass graft  2005    x 4  . Carotid endarterectomy  2005    right  . Cataract surgery  2011    bilateral  . Colonoscopy    . Cardiac catheterization  05/28/11  . Amputation  06/14/2011    Procedure: AMPUTATION DIGIT;  Surgeon: Elam Dutch, MD;  Location: [facility] OR;  Service: Vascular;  Laterality: Left;  Amputation Left fifth toe  . Amputation  06/16/2011    Procedure: AMPUTATION BELOW KNEE;  Surgeon: Elam Dutch, MD;  Location: [facility];  Service: Vascular;  Laterality: Left;  . I&d extremity  01/31/2012    Procedure: IRRIGATION AND DEBRIDEMENT EXTREMITY;  Surgeon: Elam Dutch, MD;  Location: [facility];  Service: Vascular;  Laterality: Left;  I & D Left BKA   . Amputation Right 06/12/2012    Procedure: AMPUTATION DIGIT;  Surgeon: Elam Dutch, MD;  Location: [facility] OR;  Service: Vascular;  Laterality: Right;  GREAT TOE  . Below knee leg amputation Right 08/08/2012    Dr Oneida Alar  . Amputation Right 08/08/2012    Procedure: AMPUTATION BELOW KNEE;  Surgeon: Elam Dutch, MD;  Location: [facility]  OR;  Service: Vascular;  Laterality: Right;  . Abdominal aortagram N/A 05/28/2011    Procedure: ABDOMINAL AORTAGRAM;  Surgeon: Elam Dutch, MD;  Location: [facility] CATH LAB;  Service: Cardiovascular;  Laterality: N/A;  . Abdominal aortagram N/A 06/02/2012    Procedure: ABDOMINAL Maxcine Ham;  Surgeon: Elam Dutch, MD;  Location: [facility] CATH LAB;  Service: Cardiovascular;  Laterality: N/A;  . Abdominal aortagram N/A 06/09/2012    Procedure: ABDOMINAL Maxcine Ham;  Surgeon: Elam Dutch, MD;  Location: [facility] CATH LAB;  Service: Cardiovascular;  Laterality: N/A;    There were no vitals taken for this visit.  Visit Diagnosis:  Impaired functional mobility and activity tolerance  Balance problems      Subjective Assessment - 03/20/14 1108    Symptoms Did stand at sink with spouse assist. No issues. No pain today and no new falls.    Currently in Pain? No/denies     Treatment: At sink with min guard assist to min assist for balance and cues on posture and bil foot position with gait - lateral weight shifting - alternating UE raises - alternating upper trunk rotation left to right to look over shoulder with same side arm reaching behind him to increase the rotation - moving cones side to side across sink x 3 cones x 3 each with with reaching across body  - moving cones from low counter top to higher shelf and back down to opposite side of sink x 2 each side - alternating stepping out and back in 2 sets of 10 each side - Hip extension (pelvis toward sink) with trunk extension x 5 reps for stretching - alternating stepping backwards and then back to neutral x 10 each side       [facility] Adult PT Treatment/Exercise - 03/20/14 1110    Transfers   Sit to Stand 4: Min assist;4: Min guard;With upper extremity assist;From chair/3-in-1;With armrests  x 4 reps   Sit to Stand Details (indicate cue type and reason) cues to scoot forward, for hand placement and for anterior weight shift with standing up. Cues on posture once standing as well.                      Stand to Sit 4: Min assist;4: Min guard;With upper extremity assist;To chair/3-in-1;With armrests  x 4 reps   Stand to Sit Details cues to look to ensure he is alinged with chair and to reach back with both hands to control descent with sitting down.                            Prosthetics   Current prosthetic wear tolerance (days/week)  7 days   Current prosthetic wear tolerance (#hours/day)  avg 8 hours a day   Residual limb condition  intact per pt   Donning Prosthesis Modified independent (device/increased time)   Doffing Prosthesis Modified independent (device/increased time)           PT Short Term Goals - 03/07/14 0930    PT SHORT TERM GOAL #1   Title tolerates wear >10 hours /day without skin issues or pain (Target Date: 04/05/14)   Time 4   Period Weeks    Status New   PT SHORT TERM GOAL #2   Title donnes prostheses correctly independently (Target Date: 04/05/14)   Time 4   Period Weeks   Status New   PT SHORT TERM GOAL #3   Title sit  to/from stand w/c to RW with supervision. (Target Date: 04/05/14)   Time 4   Period Weeks   Status New   PT SHORT TERM GOAL #4   Title ambulates 20' with RW & prostheses with minimal assist. (Target Date: 04/05/14)   Time 4   Period Weeks   Status New           PT Long Term Goals - 03/07/14 0930    PT LONG TERM GOAL #1   Title independent prosthetic care correctly. (Target Date: 05/03/14)   Time 8   Period Weeks   Status New   PT LONG TERM GOAL #2   Title wears prostheses >80% of awake hours daily without skin or pain issues.  (Target Date: 05/03/14)   Time 8   Period Weeks   Status New   PT LONG TERM GOAL #3   Title ambulates 100' with RW & prostheses modified independent including sit to/from stand.  (Target Date: 05/03/14)   Time 8   Period Weeks   Status New   PT LONG TERM GOAL #4   Title reaches 10" & manages clothes in standing with RW support modified independent.  (Target Date: 05/03/14)   Time 8   Period Weeks   Status New            Plan - 03/20/14 1109    Clinical Impression Statement Pt making progress toward goals with increased standing tolerance today with more activities performed.   Pt will benefit from skilled therapeutic intervention in order to improve on the following deficits Abnormal gait;Decreased balance;Decreased activity tolerance;Decreased endurance;Decreased knowledge of use of DME;Decreased mobility;Decreased range of motion;Decreased strength   Rehab Potential Good   PT Frequency 2x / week   PT Duration 8 weeks   PT Treatment/Interventions ADLs/Self Care Home Management;DME Instruction;Gait training;Stair training;Functional mobility training;Therapeutic activities;Therapeutic exercise;Balance training;Neuromuscular re-education   PT Next Visit Plan attempt  standing with walker, pre-gait to gait with prostheses/walker, standing balance   PT Home Exercise Plan review, balance / standing at sink   Consulted and Agree with Plan of Care Patient;Family member/caregiver   Family Member Consulted wife      Problem List Patient Active Problem List   Diagnosis Date Noted  . Other constipation 09/26/2013  . Cervical disc disorder with radiculopathy of cervical region 08/07/2013  . Drug-induced constipation 06/13/2013  . DM type 2, uncontrolled, with renal complications 82/99/3716  . Hypertensive renal disease 06/13/2013  . Encounter for therapeutic drug monitoring 06/13/2013  . DM type 2, uncontrolled, with neuropathy 01/23/2013  . Hyperlipidemia with target LDL less than 100 12/06/2012  . Need for prophylactic vaccination and inoculation against influenza 09/30/2012  . Diabetes mellitus with peripheral vascular disease 09/06/2012  . GERD (gastroesophageal reflux disease) 09/06/2012  . Aftercare following surgery of the circulatory system, [place] 08/03/2012  . Anemia in chronic kidney disease(285.21) 07/24/2012  . Atherosclerosis of native arteries of the extremities with ulceration(440.23) 06/01/2012  . Hyperkalemia 04/24/2012  . Diabetic neuropathy, type II diabetes mellitus 07/27/2011  . Atherosclerosis of native arteries of the extremities with intermittent claudication 07/08/2011  . S/P BKA (below knee amputation) 06/22/2011  . Atherosclerosis of native arteries of the extremities with gangrene 06/10/2011  . CKD (chronic kidney disease) stage 4, GFR 15-29 ml/min 05/02/2011  . HTN (hypertension) 04/28/2011  . CAD (coronary artery disease) 04/28/2011  . PAD (peripheral artery disease) 04/28/2011  . Prostate cancer 04/28/2011    Willow Ora 03/20/2014, 12:05 PM  Willow Ora, PTA, CLT  [facility] [address], [place], [place] [address] [phone] 03/20/2014, 12:05 PM

## 2014-03-25 ENCOUNTER — Ambulatory Visit: Payer: Medicare Other | Admitting: Physical Therapy

## 2014-03-25 ENCOUNTER — Encounter: Payer: Self-pay | Admitting: Physical Therapy

## 2014-03-25 DIAGNOSIS — Z7409 Other reduced mobility: Secondary | ICD-10-CM

## 2014-03-25 DIAGNOSIS — R269 Unspecified abnormalities of gait and mobility: Secondary | ICD-10-CM | POA: Diagnosis not present

## 2014-03-25 DIAGNOSIS — Z89511 Acquired absence of right leg below knee: Secondary | ICD-10-CM

## 2014-03-25 DIAGNOSIS — Z4781 Encounter for orthopedic aftercare following surgical amputation: Secondary | ICD-10-CM | POA: Diagnosis not present

## 2014-03-25 DIAGNOSIS — R2689 Other abnormalities of gait and mobility: Secondary | ICD-10-CM

## 2014-03-25 DIAGNOSIS — Z89512 Acquired absence of left leg below knee: Secondary | ICD-10-CM

## 2014-03-25 DIAGNOSIS — Z89611 Acquired absence of right leg above knee: Secondary | ICD-10-CM | POA: Diagnosis not present

## 2014-03-25 DIAGNOSIS — Z89612 Acquired absence of left leg above knee: Secondary | ICD-10-CM | POA: Diagnosis not present

## 2014-03-25 NOTE — Therapy (Signed)
[facility] [address] [place], [place], [address] Phone: [phone]   Fax:  [phone]  Physical Therapy Treatment  Patient Details  Name: Aaron Long MRN: [id number] Date of Birth: Oct 01, 1940 Referring Provider:  Blanchie Serve, MD  Encounter Date: 03/25/2014      PT End of Session - 03/25/14 1532    Visit Number 5   Number of Visits 17   Date for PT Re-Evaluation 05/03/14   PT Start Time 1230   PT Stop Time 1315   PT Time Calculation (min) 45 min   Equipment Utilized During Treatment Gait belt   Activity Tolerance Patient tolerated treatment well   Behavior During Therapy [facility] for tasks assessed/performed      Past Medical History  Diagnosis Date  . Prostate cancer 2010  . PAD (peripheral artery disease)   . Gangrene of toe     dry  . Neuromuscular disorder     diabetic neuropathy  . Hypertension     takes Amlodipine daily and Metoprolol bid  . Hyperlipidemia     takes Pravastatin daily  . Myocardial infarction 2005  . Peripheral neuropathy   . Constipation     takes Miralax daily  . Hemorrhoids   . Hx of colonic polyps   . History of kidney stones   . Diabetes mellitus     Novolog and Levemir daily  . Coronary artery disease   . BPH (benign prostatic hypertrophy)   . Abnormality of gait   . Coronary atherosclerosis of native coronary artery   . Phlebitis and thrombophlebitis of other deep vessels of lower extremities   . Hypertrophy of prostate without urinary obstruction and other lower urinary tract symptoms (LUTS)   . Disorder of bone and cartilage, unspecified   . Lower limb amputation, below knee   . Long term (current) use of anticoagulants   . Anemia 06/19/2012  . Thyroid disease     Secondary Hyperpara- Thyroidism, Renal  . Chronic kidney disease     Stage IV    Past Surgical History  Procedure Laterality Date  . Prostate surgery  2009    prostatectomy  . Knee cartilage  surgery  at age 61    left knee  . Cea      Right  . Coronary artery bypass graft  2005    x 4  . Carotid endarterectomy  2005    right  . Cataract surgery  2011    bilateral  . Colonoscopy    . Cardiac catheterization  05/28/11  . Amputation  06/14/2011    Procedure: AMPUTATION DIGIT;  Surgeon: Elam Dutch, MD;  Location: [facility] OR;  Service: Vascular;  Laterality: Left;  Amputation Left fifth toe  . Amputation  06/16/2011    Procedure: AMPUTATION BELOW KNEE;  Surgeon: Elam Dutch, MD;  Location: [place];  Service: Vascular;  Laterality: Left;  . I&d extremity  01/31/2012    Procedure: IRRIGATION AND DEBRIDEMENT EXTREMITY;  Surgeon: Elam Dutch, MD;  Location: [place];  Service: Vascular;  Laterality: Left;  I & D Left BKA   . Amputation Right 06/12/2012    Procedure: AMPUTATION DIGIT;  Surgeon: Elam Dutch, MD;  Location: [facility] OR;  Service: Vascular;  Laterality: Right;  GREAT TOE  . Below knee leg amputation Right 08/08/2012    Dr Oneida Alar  . Amputation Right 08/08/2012    Procedure: AMPUTATION BELOW KNEE;  Surgeon: Elam Dutch, MD;  Location: [facility]  OR;  Service: Vascular;  Laterality: Right;  . Abdominal aortagram N/A 05/28/2011    Procedure: ABDOMINAL AORTAGRAM;  Surgeon: Elam Dutch, MD;  Location: [facility] CATH LAB;  Service: Cardiovascular;  Laterality: N/A;  . Abdominal aortagram N/A 06/02/2012    Procedure: ABDOMINAL Maxcine Ham;  Surgeon: Elam Dutch, MD;  Location: [facility] CATH LAB;  Service: Cardiovascular;  Laterality: N/A;  . Abdominal aortagram N/A 06/09/2012    Procedure: ABDOMINAL Maxcine Ham;  Surgeon: Elam Dutch, MD;  Location: [facility] CATH LAB;  Service: Cardiovascular;  Laterality: N/A;    There were no vitals filed for this visit.  Visit Diagnosis:  Impaired functional mobility and activity tolerance  Balance problems  Status post bilateral below knee amputation  Abnormality of gait      Subjective Assessment - 03/25/14 1235    Symptoms wearing prostheses  5 hours 2x/day without issues. No falls.   Currently in Pain? No/denies                       [facility] Adult PT Treatment/Exercise - 03/25/14 1230    Transfers   Sit to Stand 4: Min assist;4: Min guard;With upper extremity assist;From chair/3-in-1;With armrests  x 8 reps, w/c to RW   Stand to Sit 4: Min assist;4: Min guard;With upper extremity assist;To chair/3-in-1;With armrests  x 8 reps, RW to w/c   Ambulation/Gait   Ambulation/Gait Yes   Ambulation/Gait Assistance 4: Min assist   Ambulation/Gait Assistance Details verbal & manual cues on posture /RW position, step length   Ambulation Distance (Feet) 25 Feet  4 reps   Assistive device Rolling walker;Prostheses   Gait Pattern Step-to pattern;Decreased step length - right;Decreased step length - left;Decreased stride length;Right flexed knee in stance;Left flexed knee in stance;Trunk flexed   Ambulation Surface Level;Indoor   Knee/Hip Exercises: Aerobic   Stationary Bike NuStep Level 4 with BUEs & BLEs, 5 min   Prosthetics   Current prosthetic wear tolerance (days/week)  7 days   Current prosthetic wear tolerance (#hours/day)  PT instructed to increase to all awake / out of bed time,    Residual limb condition  intact per pt   Education Provided Proper wear schedule/adjustment;Residual limb care  signs of sweating / dry limb - liner   Person(s) Educated Patient   Education Method Explanation   Education Method Verbalized understanding   Donning Prosthesis Modified independent (device/increased time)   Doffing Prosthesis Modified independent (device/increased time)                PT Education - 03/25/14 1532    Education provided Yes   Education Details see prosthetic care   Person(s) Educated Patient   Methods Explanation   Comprehension Verbalized understanding          PT Short Term Goals - 03/07/14 0930    PT SHORT TERM GOAL #1   Title tolerates wear >10 hours /day without skin issues or pain  (Target Date: 04/05/14)   Time 4   Period Weeks   Status New   PT SHORT TERM GOAL #2   Title donnes prostheses correctly independently (Target Date: 04/05/14)   Time 4   Period Weeks   Status New   PT SHORT TERM GOAL #3   Title sit to/from stand w/c to RW with supervision. (Target Date: 04/05/14)   Time 4   Period Weeks   Status New   PT SHORT TERM GOAL #4   Title ambulates 20' with RW & prostheses  with minimal assist. (Target Date: 04/05/14)   Time 4   Period Weeks   Status New           PT Long Term Goals - 03/07/14 0930    PT LONG TERM GOAL #1   Title independent prosthetic care correctly. (Target Date: 05/03/14)   Time 8   Period Weeks   Status New   PT LONG TERM GOAL #2   Title wears prostheses >80% of awake hours daily without skin or pain issues.  (Target Date: 05/03/14)   Time 8   Period Weeks   Status New   PT LONG TERM GOAL #3   Title ambulates 100' with RW & prostheses modified independent including sit to/from stand.  (Target Date: 05/03/14)   Time 8   Period Weeks   Status New   PT LONG TERM GOAL #4   Title reaches 10" & manages clothes in standing with RW support modified independent.  (Target Date: 05/03/14)   Time 8   Period Weeks   Status New               Plan - 03/25/14 1533    Clinical Impression Statement Patient improved sit to /from stand requiring less assist with each repetition. He was able to progress to gait with PT assist and instruction.   Pt will benefit from skilled therapeutic intervention in order to improve on the following deficits Abnormal gait;Decreased balance;Decreased activity tolerance;Decreased endurance;Decreased knowledge of use of DME;Decreased mobility;Decreased range of motion;Decreased strength   Rehab Potential Good   PT Frequency 2x / week   PT Duration 8 weeks   PT Treatment/Interventions ADLs/Self Care Home Management;DME Instruction;Gait training;Stair training;Functional mobility training;Therapeutic  activities;Therapeutic exercise;Balance training;Neuromuscular re-education   PT Next Visit Plan gait & balance with RW   Consulted and Agree with Plan of Care Patient        Problem List Patient Active Problem List   Diagnosis Date Noted  . Other constipation 09/26/2013  . Cervical disc disorder with radiculopathy of cervical region 08/07/2013  . Drug-induced constipation 06/13/2013  . DM type 2, uncontrolled, with renal complications 61/95/0932  . Hypertensive renal disease 06/13/2013  . Encounter for therapeutic drug monitoring 06/13/2013  . DM type 2, uncontrolled, with neuropathy 01/23/2013  . Hyperlipidemia with target LDL less than 100 12/06/2012  . Need for prophylactic vaccination and inoculation against influenza 09/30/2012  . Diabetes mellitus with peripheral vascular disease 09/06/2012  . GERD (gastroesophageal reflux disease) 09/06/2012  . Aftercare following surgery of the circulatory system, [place] 08/03/2012  . Anemia in chronic kidney disease(285.21) 07/24/2012  . Atherosclerosis of native arteries of the extremities with ulceration(440.23) 06/01/2012  . Hyperkalemia 04/24/2012  . Diabetic neuropathy, type II diabetes mellitus 07/27/2011  . Atherosclerosis of native arteries of the extremities with intermittent claudication 07/08/2011  . S/P BKA (below knee amputation) 06/22/2011  . Atherosclerosis of native arteries of the extremities with gangrene 06/10/2011  . CKD (chronic kidney disease) stage 4, GFR 15-29 ml/min 05/02/2011  . HTN (hypertension) 04/28/2011  . CAD (coronary artery disease) 04/28/2011  . PAD (peripheral artery disease) 04/28/2011  . Prostate cancer 04/28/2011    Jamey Reas PT, DPT 03/25/2014, 3:35 PM  [facility] [address] [place], [place], [address] Phone: [phone]   Fax:  [phone]

## 2014-03-27 ENCOUNTER — Encounter: Payer: Self-pay | Admitting: Physical Therapy

## 2014-03-27 ENCOUNTER — Ambulatory Visit: Payer: Medicare Other | Admitting: Physical Therapy

## 2014-03-27 DIAGNOSIS — R269 Unspecified abnormalities of gait and mobility: Secondary | ICD-10-CM | POA: Diagnosis not present

## 2014-03-27 DIAGNOSIS — Z89611 Acquired absence of right leg above knee: Secondary | ICD-10-CM | POA: Diagnosis not present

## 2014-03-27 DIAGNOSIS — Z7409 Other reduced mobility: Secondary | ICD-10-CM

## 2014-03-27 DIAGNOSIS — Z4781 Encounter for orthopedic aftercare following surgical amputation: Secondary | ICD-10-CM | POA: Diagnosis not present

## 2014-03-27 DIAGNOSIS — Z89612 Acquired absence of left leg above knee: Secondary | ICD-10-CM | POA: Diagnosis not present

## 2014-03-28 ENCOUNTER — Other Ambulatory Visit: Payer: Self-pay

## 2014-03-28 MED ORDER — HYDROCODONE-ACETAMINOPHEN 5-325 MG PO TABS: 1.0000 | ORAL_TABLET | Freq: Three times a day (TID) | ORAL | Status: DC | PRN

## 2014-03-28 NOTE — Therapy (Signed)
[facility] [address] [place], [place], [address] Phone: [phone]   Fax:  [phone]  Physical Therapy Treatment  Patient Details  Name: Aaron Long MRN: [id number] Date of Birth: 1940-09-10 Referring Provider:  Blanchie Serve, MD  Encounter Date: 03/27/2014      PT End of Session - 03/27/14 1109    Visit Number 6   Number of Visits 17   Date for PT Re-Evaluation 05/03/14   PT Start Time 1105   PT Stop Time 1145   PT Time Calculation (min) 40 min   Equipment Utilized During Treatment Gait belt   Activity Tolerance Patient tolerated treatment well   Behavior During Therapy [facility] for tasks assessed/performed      Past Medical History  Diagnosis Date  . Prostate cancer 2010  . PAD (peripheral artery disease)   . Gangrene of toe     dry  . Neuromuscular disorder     diabetic neuropathy  . Hypertension     takes Amlodipine daily and Metoprolol bid  . Hyperlipidemia     takes Pravastatin daily  . Myocardial infarction 2005  . Peripheral neuropathy   . Constipation     takes Miralax daily  . Hemorrhoids   . Hx of colonic polyps   . History of kidney stones   . Diabetes mellitus     Novolog and Levemir daily  . Coronary artery disease   . BPH (benign prostatic hypertrophy)   . Abnormality of gait   . Coronary atherosclerosis of native coronary artery   . Phlebitis and thrombophlebitis of other deep vessels of lower extremities   . Hypertrophy of prostate without urinary obstruction and other lower urinary tract symptoms (LUTS)   . Disorder of bone and cartilage, unspecified   . Lower limb amputation, below knee   . Long term (current) use of anticoagulants   . Anemia 06/19/2012  . Thyroid disease     Secondary Hyperpara- Thyroidism, Renal  . Chronic kidney disease     Stage IV    Past Surgical History  Procedure Laterality Date  . Prostate surgery  2009    prostatectomy  . Knee cartilage  surgery  at age 57    left knee  . Cea      Right  . Coronary artery bypass graft  2005    x 4  . Carotid endarterectomy  2005    right  . Cataract surgery  2011    bilateral  . Colonoscopy    . Cardiac catheterization  05/28/11  . Amputation  06/14/2011    Procedure: AMPUTATION DIGIT;  Surgeon: Elam Dutch, MD;  Location: [facility] OR;  Service: Vascular;  Laterality: Left;  Amputation Left fifth toe  . Amputation  06/16/2011    Procedure: AMPUTATION BELOW KNEE;  Surgeon: Elam Dutch, MD;  Location: [place];  Service: Vascular;  Laterality: Left;  . I&d extremity  01/31/2012    Procedure: IRRIGATION AND DEBRIDEMENT EXTREMITY;  Surgeon: Elam Dutch, MD;  Location: [place];  Service: Vascular;  Laterality: Left;  I & D Left BKA   . Amputation Right 06/12/2012    Procedure: AMPUTATION DIGIT;  Surgeon: Elam Dutch, MD;  Location: [facility] OR;  Service: Vascular;  Laterality: Right;  GREAT TOE  . Below knee leg amputation Right 08/08/2012    Dr Oneida Alar  . Amputation Right 08/08/2012    Procedure: AMPUTATION BELOW KNEE;  Surgeon: Elam Dutch, MD;  Location: [facility]  OR;  Service: Vascular;  Laterality: Right;  . Abdominal aortagram N/A 05/28/2011    Procedure: ABDOMINAL AORTAGRAM;  Surgeon: Elam Dutch, MD;  Location: [facility] CATH LAB;  Service: Cardiovascular;  Laterality: N/A;  . Abdominal aortagram N/A 06/02/2012    Procedure: ABDOMINAL Maxcine Ham;  Surgeon: Elam Dutch, MD;  Location: [facility] CATH LAB;  Service: Cardiovascular;  Laterality: N/A;  . Abdominal aortagram N/A 06/09/2012    Procedure: ABDOMINAL Maxcine Ham;  Surgeon: Elam Dutch, MD;  Location: [facility] CATH LAB;  Service: Cardiovascular;  Laterality: N/A;    There were no vitals filed for this visit.  Visit Diagnosis:  Impaired functional mobility and activity tolerance  Abnormality of gait      Subjective Assessment - 03/27/14 1108    Symptoms No new complaints. No falls or pain to report. Was "very sore" after last session. So  sore he was only able to stand one time yesterday with spouse  at sink.   Currently in Pain? No/denies           [facility] Adult PT Treatment/Exercise - 03/27/14 1110    Ambulation/Gait   Ambulation/Gait Yes   Ambulation/Gait Assistance 4: Min assist  + chair follow for safety    Ambulation/Gait Assistance Details multimodal cues needed for posture, walker position and to increase step/stride length                             Ambulation Distance (Feet) 40 Feet  x1, 70 x1, 65 x1   Assistive device Rolling walker   Gait Pattern Step-to pattern;Decreased step length - right;Decreased step length - left;Decreased stride length;Right flexed knee in stance;Left flexed knee in stance;Trunk flexed   Ambulation Surface Level;Indoor   Knee/Hip Exercises: Aerobic   Stationary Bike Scifit x 4 extremities level 3.5 x 8 minutes with goal rpm >/= 55 for strengthening and activity tolerance                Prosthetics   Current prosthetic wear tolerance (days/week)  7 days   Current prosthetic wear tolerance (#hours/day)  all awake hours starting 03/26/14  drying as needed   Residual limb condition  intact per pt   Education Provided Residual limb care;Correct ply sock adjustment;Proper wear schedule/adjustment   Person(s) Educated Patient   Education Method Explanation   Education Method Verbalized understanding   Donning Prosthesis Modified independent (device/increased time)   Doffing Prosthesis Modified independent (device/increased time)            PT Short Term Goals - 03/07/14 0930    PT SHORT TERM GOAL #1   Title tolerates wear >10 hours /day without skin issues or pain (Target Date: 04/05/14)   Time 4   Period Weeks   Status New   PT SHORT TERM GOAL #2   Title donnes prostheses correctly independently (Target Date: 04/05/14)   Time 4   Period Weeks   Status New   PT SHORT TERM GOAL #3   Title sit to/from stand w/c to RW with supervision. (Target Date: 04/05/14)   Time 4   Period  Weeks   Status New   PT SHORT TERM GOAL #4   Title ambulates 64' with RW & prostheses with minimal assist. (Target Date: 04/05/14)   Time 4   Period Weeks   Status New           PT Long Term Goals - 03/07/14 0930    PT LONG TERM  GOAL #1   Title independent prosthetic care correctly. (Target Date: 05/03/14)   Time 8   Period Weeks   Status New   PT LONG TERM GOAL #2   Title wears prostheses >80% of awake hours daily without skin or pain issues.  (Target Date: 05/03/14)   Time 8   Period Weeks   Status New   PT LONG TERM GOAL #3   Title ambulates 100' with RW & prostheses modified independent including sit to/from stand.  (Target Date: 05/03/14)   Time 8   Period Weeks   Status New   PT LONG TERM GOAL #4   Title reaches 10" & manages clothes in standing with RW support modified independent.  (Target Date: 05/03/14)   Time 8   Period Weeks   Status New           Plan - 03/27/14 1109    Clinical Impression Statement Pt with increased gait distance and overall activity tolerance today. Progressing toward goals.   Pt will benefit from skilled therapeutic intervention in order to improve on the following deficits Abnormal gait;Decreased balance;Decreased activity tolerance;Decreased endurance;Decreased knowledge of use of DME;Decreased mobility;Decreased range of motion;Decreased strength   Rehab Potential Good   PT Frequency 2x / week   PT Duration 8 weeks   PT Treatment/Interventions ADLs/Self Care Home Management;DME Instruction;Gait training;Stair training;Functional mobility training;Therapeutic activities;Therapeutic exercise;Balance training;Neuromuscular re-education   PT Next Visit Plan gait & balance with RW   Consulted and Agree with Plan of Care Patient      Problem List Patient Active Problem List   Diagnosis Date Noted  . Other constipation 09/26/2013  . Cervical disc disorder with radiculopathy of cervical region 08/07/2013  . Drug-induced constipation  06/13/2013  . DM type 2, uncontrolled, with renal complications 91/50/5697  . Hypertensive renal disease 06/13/2013  . Encounter for therapeutic drug monitoring 06/13/2013  . DM type 2, uncontrolled, with neuropathy 01/23/2013  . Hyperlipidemia with target LDL less than 100 12/06/2012  . Need for prophylactic vaccination and inoculation against influenza 09/30/2012  . Diabetes mellitus with peripheral vascular disease 09/06/2012  . GERD (gastroesophageal reflux disease) 09/06/2012  . Aftercare following surgery of the circulatory system, [place] 08/03/2012  . Anemia in chronic kidney disease(285.21) 07/24/2012  . Atherosclerosis of native arteries of the extremities with ulceration(440.23) 06/01/2012  . Hyperkalemia 04/24/2012  . Diabetic neuropathy, type II diabetes mellitus 07/27/2011  . Atherosclerosis of native arteries of the extremities with intermittent claudication 07/08/2011  . S/P BKA (below knee amputation) 06/22/2011  . Atherosclerosis of native arteries of the extremities with gangrene 06/10/2011  . CKD (chronic kidney disease) stage 4, GFR 15-29 ml/min 05/02/2011  . HTN (hypertension) 04/28/2011  . CAD (coronary artery disease) 04/28/2011  . PAD (peripheral artery disease) 04/28/2011  . Prostate cancer 04/28/2011    Willow Ora 03/28/2014, 4:09 PM  Willow Ora, PTA, [facility] [address], [place], [place] [address] [phone] 03/28/2014, 4:09 PM

## 2014-04-01 ENCOUNTER — Encounter: Payer: Self-pay | Admitting: Physical Therapy

## 2014-04-01 ENCOUNTER — Ambulatory Visit: Payer: Medicare Other | Admitting: Physical Therapy

## 2014-04-01 DIAGNOSIS — Z89612 Acquired absence of left leg above knee: Secondary | ICD-10-CM | POA: Diagnosis not present

## 2014-04-01 DIAGNOSIS — R2689 Other abnormalities of gait and mobility: Secondary | ICD-10-CM

## 2014-04-01 DIAGNOSIS — Z4781 Encounter for orthopedic aftercare following surgical amputation: Secondary | ICD-10-CM | POA: Diagnosis not present

## 2014-04-01 DIAGNOSIS — Z89511 Acquired absence of right leg below knee: Secondary | ICD-10-CM

## 2014-04-01 DIAGNOSIS — R269 Unspecified abnormalities of gait and mobility: Secondary | ICD-10-CM

## 2014-04-01 DIAGNOSIS — Z7409 Other reduced mobility: Secondary | ICD-10-CM

## 2014-04-01 DIAGNOSIS — Z89512 Acquired absence of left leg below knee: Secondary | ICD-10-CM

## 2014-04-01 DIAGNOSIS — Z89611 Acquired absence of right leg above knee: Secondary | ICD-10-CM | POA: Diagnosis not present

## 2014-04-01 NOTE — Therapy (Signed)
[facility] [address] [place], [place], [address] Phone: [phone]   Fax:  [phone]  Physical Therapy Treatment  Patient Details  Name: Aaron Long MRN: [id number] Date of Birth: 1940/03/13 Referring Provider:  Blanchie Serve, MD  Encounter Date: 04/01/2014      PT End of Session - 04/01/14 1315    Visit Number 7   Number of Visits 17   Date for PT Re-Evaluation 05/03/14   PT Start Time 1315   PT Stop Time 1400   PT Time Calculation (min) 45 min   Equipment Utilized During Treatment Gait belt   Activity Tolerance Patient tolerated treatment well   Behavior During Therapy [facility] for tasks assessed/performed      Past Medical History  Diagnosis Date  . Prostate cancer 2010  . PAD (peripheral artery disease)   . Gangrene of toe     dry  . Neuromuscular disorder     diabetic neuropathy  . Hypertension     takes Amlodipine daily and Metoprolol bid  . Hyperlipidemia     takes Pravastatin daily  . Myocardial infarction 2005  . Peripheral neuropathy   . Constipation     takes Miralax daily  . Hemorrhoids   . Hx of colonic polyps   . History of kidney stones   . Diabetes mellitus     Novolog and Levemir daily  . Coronary artery disease   . BPH (benign prostatic hypertrophy)   . Abnormality of gait   . Coronary atherosclerosis of native coronary artery   . Phlebitis and thrombophlebitis of other deep vessels of lower extremities   . Hypertrophy of prostate without urinary obstruction and other lower urinary tract symptoms (LUTS)   . Disorder of bone and cartilage, unspecified   . Lower limb amputation, below knee   . Long term (current) use of anticoagulants   . Anemia 06/19/2012  . Thyroid disease     Secondary Hyperpara- Thyroidism, Renal  . Chronic kidney disease     Stage IV    Past Surgical History  Procedure Laterality Date  . Prostate surgery  2009    prostatectomy  . Knee cartilage  surgery  at age 73    left knee  . Cea      Right  . Coronary artery bypass graft  2005    x 4  . Carotid endarterectomy  2005    right  . Cataract surgery  2011    bilateral  . Colonoscopy    . Cardiac catheterization  05/28/11  . Amputation  06/14/2011    Procedure: AMPUTATION DIGIT;  Surgeon: Elam Dutch, MD;  Location: [facility] OR;  Service: Vascular;  Laterality: Left;  Amputation Left fifth toe  . Amputation  06/16/2011    Procedure: AMPUTATION BELOW KNEE;  Surgeon: Elam Dutch, MD;  Location: [place];  Service: Vascular;  Laterality: Left;  . I&d extremity  01/31/2012    Procedure: IRRIGATION AND DEBRIDEMENT EXTREMITY;  Surgeon: Elam Dutch, MD;  Location: [place];  Service: Vascular;  Laterality: Left;  I & D Left BKA   . Amputation Right 06/12/2012    Procedure: AMPUTATION DIGIT;  Surgeon: Elam Dutch, MD;  Location: [facility] OR;  Service: Vascular;  Laterality: Right;  GREAT TOE  . Below knee leg amputation Right 08/08/2012    Dr Oneida Alar  . Amputation Right 08/08/2012    Procedure: AMPUTATION BELOW KNEE;  Surgeon: Elam Dutch, MD;  Location: [facility]  OR;  Service: Vascular;  Laterality: Right;  . Abdominal aortagram N/A 05/28/2011    Procedure: ABDOMINAL AORTAGRAM;  Surgeon: Elam Dutch, MD;  Location: [facility] CATH LAB;  Service: Cardiovascular;  Laterality: N/A;  . Abdominal aortagram N/A 06/02/2012    Procedure: ABDOMINAL Maxcine Ham;  Surgeon: Elam Dutch, MD;  Location: [facility] CATH LAB;  Service: Cardiovascular;  Laterality: N/A;  . Abdominal aortagram N/A 06/09/2012    Procedure: ABDOMINAL Maxcine Ham;  Surgeon: Elam Dutch, MD;  Location: [facility] CATH LAB;  Service: Cardiovascular;  Laterality: N/A;    There were no vitals filed for this visit.  Visit Diagnosis:  Impaired functional mobility and activity tolerance  Abnormality of gait  Balance problems  Status post bilateral below knee amputation      Subjective Assessment - 04/01/14 1323    Symptoms wearing prostheses  all awake hours and drying 1x/day without issues.   Currently in Pain? No/denies                 Bilateral leg press 50# 15 reps, right & left leg single leg press 30# 10 reps each      [facility] Adult PT Treatment/Exercise - 04/01/14 1315    Transfers   Sit to Stand 4: Min assist   Stand to Sit 4: Min assist   Ambulation/Gait   Ambulation/Gait Yes   Ambulation/Gait Assistance 4: Min assist  + chair follow for safety    Ambulation Distance (Feet) 80 Feet  80' X 3   Assistive device Rolling walker;Prostheses   Gait Pattern Step-to pattern;Decreased step length - right;Decreased step length - left;Decreased stride length;Right flexed knee in stance;Left flexed knee in stance;Trunk flexed   Ambulation Surface Level;Indoor   Dynamic Standing Balance   Dynamic Standing - Balance Support No upper extremity supported  sitting on 29" bar stool   Dynamic Standing - Level of Assistance 4: Min assist   Dynamic Standing - Balance Activities Reaching for objects  red theraband reciprocal & BUE rows, forward & upward reach   Dynamic Standing - Comments trunk flexion with recovery, trunk extension with recovery   Knee/Hip Exercises: Stretches   Passive Hamstring Stretch 3 reps;30 seconds  bil. LEs   Knee/Hip Exercises: Aerobic   Stationary Bike --   Prosthetics   Current prosthetic wear tolerance (days/week)  7 days   Current prosthetic wear tolerance (#hours/day)  all awake hours starting 03/26/14  drying as needed   Current prosthetic weight-bearing tolerance (hours/day)  5 minutes   Residual limb condition  intact    Education Provided Skin check;Residual limb care;Proper wear schedule/adjustment;Correct ply sock adjustment  signs of sweating, drying limb & liner   Person(s) Educated Patient   Education Method Explanation   Education Method Verbalized understanding   Donning Prosthesis Modified independent (device/increased time)   Doffing Prosthesis Modified independent  (device/increased time)                PT Education - 04/01/14 1315    Education provided Yes   Education Details See prosthetic care   Person(s) Educated Patient   Methods Explanation;Demonstration   Comprehension Verbalized understanding;Returned demonstration          PT Short Term Goals - 03/07/14 0930    PT SHORT TERM GOAL #1   Title tolerates wear >10 hours /day without skin issues or pain (Target Date: 04/05/14)   Time 4   Period Weeks   Status New   PT SHORT TERM GOAL #2   Title donnes  prostheses correctly independently (Target Date: 04/05/14)   Time 4   Period Weeks   Status New   PT SHORT TERM GOAL #3   Title sit to/from stand w/c to RW with supervision. (Target Date: 04/05/14)   Time 4   Period Weeks   Status New   PT SHORT TERM GOAL #4   Title ambulates 20' with RW & prostheses with minimal assist. (Target Date: 04/05/14)   Time 4   Period Weeks   Status New           PT Long Term Goals - 03/07/14 0930    PT LONG TERM GOAL #1   Title independent prosthetic care correctly. (Target Date: 05/03/14)   Time 8   Period Weeks   Status New   PT LONG TERM GOAL #2   Title wears prostheses >80% of awake hours daily without skin or pain issues.  (Target Date: 05/03/14)   Time 8   Period Weeks   Status New   PT LONG TERM GOAL #3   Title ambulates 100' with RW & prostheses modified independent including sit to/from stand.  (Target Date: 05/03/14)   Time 8   Period Weeks   Status New   PT LONG TERM GOAL #4   Title reaches 10" & manages clothes in standing with RW support modified independent.  (Target Date: 05/03/14)   Time 8   Period Weeks   Status New               Plan - 04/01/14 1315    Clinical Impression Statement Patient was challenged by strength & balance activities and was fatigued by end of session.   Pt will benefit from skilled therapeutic intervention in order to improve on the following deficits Abnormal gait;Decreased  balance;Decreased activity tolerance;Decreased endurance;Decreased knowledge of use of DME;Decreased mobility;Decreased range of motion;Decreased strength   Rehab Potential Good   PT Frequency 2x / week   PT Duration 8 weeks   PT Treatment/Interventions ADLs/Self Care Home Management;DME Instruction;Gait training;Stair training;Functional mobility training;Therapeutic activities;Therapeutic exercise;Balance training;Neuromuscular re-education   PT Next Visit Plan gait & balance with RW, strength   Consulted and Agree with Plan of Care Patient        Problem List Patient Active Problem List   Diagnosis Date Noted  . Other constipation 09/26/2013  . Cervical disc disorder with radiculopathy of cervical region 08/07/2013  . Drug-induced constipation 06/13/2013  . DM type 2, uncontrolled, with renal complications 96/28/3662  . Hypertensive renal disease 06/13/2013  . Encounter for therapeutic drug monitoring 06/13/2013  . DM type 2, uncontrolled, with neuropathy 01/23/2013  . Hyperlipidemia with target LDL less than 100 12/06/2012  . Need for prophylactic vaccination and inoculation against influenza 09/30/2012  . Diabetes mellitus with peripheral vascular disease 09/06/2012  . GERD (gastroesophageal reflux disease) 09/06/2012  . Aftercare following surgery of the circulatory system, [place] 08/03/2012  . Anemia in chronic kidney disease(285.21) 07/24/2012  . Atherosclerosis of native arteries of the extremities with ulceration(440.23) 06/01/2012  . Hyperkalemia 04/24/2012  . Diabetic neuropathy, type II diabetes mellitus 07/27/2011  . Atherosclerosis of native arteries of the extremities with intermittent claudication 07/08/2011  . S/P BKA (below knee amputation) 06/22/2011  . Atherosclerosis of native arteries of the extremities with gangrene 06/10/2011  . CKD (chronic kidney disease) stage 4, GFR 15-29 ml/min 05/02/2011  . HTN (hypertension) 04/28/2011  . CAD (coronary artery disease)  04/28/2011  . PAD (peripheral artery disease) 04/28/2011  . Prostate cancer 04/28/2011  Jamey Reas PT, DPT 04/01/2014, 6:53 PM  [place] [address] [place], [place], [address] Phone: [phone]   Fax:  [phone]

## 2014-04-02 ENCOUNTER — Encounter: Payer: Self-pay | Admitting: Internal Medicine

## 2014-04-02 ENCOUNTER — Ambulatory Visit (INDEPENDENT_AMBULATORY_CARE_PROVIDER_SITE_OTHER): Payer: Medicare Other | Admitting: Internal Medicine

## 2014-04-02 VITALS — BP 112/70 | HR 82 | Temp 98.4°F | Resp 20 | Ht 68.0 in | Wt 210.0 lb

## 2014-04-02 DIAGNOSIS — E785 Hyperlipidemia, unspecified: Secondary | ICD-10-CM | POA: Diagnosis not present

## 2014-04-02 DIAGNOSIS — N184 Chronic kidney disease, stage 4 (severe): Secondary | ICD-10-CM

## 2014-04-02 DIAGNOSIS — Z23 Encounter for immunization: Secondary | ICD-10-CM | POA: Diagnosis not present

## 2014-04-02 DIAGNOSIS — I798 Other disorders of arteries, arterioles and capillaries in diseases classified elsewhere: Secondary | ICD-10-CM | POA: Diagnosis not present

## 2014-04-02 DIAGNOSIS — Z89511 Acquired absence of right leg below knee: Secondary | ICD-10-CM | POA: Diagnosis not present

## 2014-04-02 DIAGNOSIS — K219 Gastro-esophageal reflux disease without esophagitis: Secondary | ICD-10-CM

## 2014-04-02 DIAGNOSIS — I2510 Atherosclerotic heart disease of native coronary artery without angina pectoris: Secondary | ICD-10-CM | POA: Diagnosis not present

## 2014-04-02 DIAGNOSIS — I10 Essential (primary) hypertension: Secondary | ICD-10-CM

## 2014-04-02 DIAGNOSIS — Z7189 Other specified counseling: Secondary | ICD-10-CM

## 2014-04-02 DIAGNOSIS — E1151 Type 2 diabetes mellitus with diabetic peripheral angiopathy without gangrene: Secondary | ICD-10-CM

## 2014-04-02 DIAGNOSIS — Z89512 Acquired absence of left leg below knee: Secondary | ICD-10-CM | POA: Diagnosis not present

## 2014-04-02 DIAGNOSIS — K5909 Other constipation: Secondary | ICD-10-CM | POA: Diagnosis not present

## 2014-04-02 MED ORDER — HYDROCODONE-ACETAMINOPHEN 5-325 MG PO TABS: 1.0000 | ORAL_TABLET | Freq: Two times a day (BID) | ORAL | Status: DC | PRN

## 2014-04-02 NOTE — Progress Notes (Signed)
Patient ID: Aaron Long, male   DOB: 28-Nov-1940, 74 y.o.   MRN: [id number]    Chief Complaint  Patient presents with  . Medical Management of Chronic Issues    OV Follow up   Allergies  Allergen Reactions  . Oxycodone Other (See Comments)    Hallucinations   HPI 74 y/o male patient is seen for routine visit.   Chronic pain His pain is under control. He denies any concerns.   Bilateral BKA He has been working with physical therapy for gait training  DM He has not been checking his blood sugar recently. Injects 22 u levemir  Constipation linzess not helping. Taking miralax and this has been helpful   Would like to change his code status from DNR to full code  Review of Systems   Constitutional: Negative for diaphoresis, fever or chills HENT: Negative for congestion and sore throat.    Eyes: Negative for blurred vision and double vision.   Respiratory: Negative for cough, shortness of breath and wheezing.    Cardiovascular: Negative for chest pain, palpitation   Gastrointestinal: Negative for heartburn, nausea, vomiting, abdominal pain, diarrhea. Bowel movement is regular  Genitourinary: Negative for dysuria Musculoskeletal: Negative for myalgias Skin: Negative for rash.   Neurological: Negative for dizziness and headaches.   Psychiatric/Behavioral: Negative for memory loss. The patient is not nervous/anxious. no sleep disturbances  Past Medical History  Diagnosis Date  . Prostate cancer 2010  . PAD (peripheral artery disease)   . Gangrene of toe     dry  . Neuromuscular disorder     diabetic neuropathy  . Hypertension     takes Amlodipine daily and Metoprolol bid  . Hyperlipidemia     takes Pravastatin daily  . Myocardial infarction 2005  . Peripheral neuropathy   . Constipation     takes Miralax daily  . Hemorrhoids   . Hx of colonic polyps   . History of kidney stones   . Diabetes mellitus     Novolog and Levemir daily  . Coronary artery disease     . BPH (benign prostatic hypertrophy)   . Abnormality of gait   . Coronary atherosclerosis of native coronary artery   . Phlebitis and thrombophlebitis of other deep vessels of lower extremities   . Hypertrophy of prostate without urinary obstruction and other lower urinary tract symptoms (LUTS)   . Disorder of bone and cartilage, unspecified   . Lower limb amputation, below knee   . Long term (current) use of anticoagulants   . Anemia 06/19/2012  . Thyroid disease     Secondary Hyperpara- Thyroidism, Renal  . Chronic kidney disease     Stage IV   Current Outpatient Prescriptions on File Prior to Visit  Medication Sig Dispense Refill  . AMBULATORY NON FORMULARY MEDICATION BD U/P Mini Pen [place] 31GX5MM Use three times daily as directed DX:250.00 100 each 11  . AMBULATORY NON FORMULARY MEDICATION One Touch Ultra 2 Test Strips Sig:  Check blood sugars Three times daily to keep blood sugars under control Dx: 250.00, 401.9 100 strip 11  . amLODipine (NORVASC) 10 MG tablet take 1 tablet by mouth once daily 90 tablet 1  . aspirin EC 81 MG tablet Take 1 tablet (81 mg total) by mouth every morning.    . clopidogrel (PLAVIX) 75 MG tablet take 1 tablet by mouth once daily 90 tablet 1  . doxazosin (CARDURA) 1 MG tablet take 1 tablet by mouth at bedtime 90 tablet 1  .  hydrALAZINE (APRESOLINE) 50 MG tablet Take 1 tablet (50 mg total) by mouth 2 (two) times daily. 60 tablet 3  . insulin aspart (NOVOLOG) 100 UNIT/ML FlexPen Inject 5 Units into the skin 3 (three) times daily with meals. Inject 5 units three times a day with meals if blood sugar greater than 150 only, DX: 250.71    . Insulin Detemir (LEVEMIR) 100 UNIT/ML Pen Inject 22 Units into the skin at bedtime. 15 mL 3  . LEVEMIR FLEXTOUCH 100 UNIT/ML Pen inject 22 units subcutaneously at bedtime 15 mL 3  . LYRICA 75 MG capsule take 1 capsule by mouth twice a day 60 capsule 5  . metoprolol succinate (TOPROL-XL) 25 MG 24 hr tablet Take 1 tablet (25  mg total) by mouth daily. 90 tablet 1  . Misc. Devices [facility]) [place] Patient needs new Wheelchair due to his being broken. 1 each 0  . pantoprazole (PROTONIX) 20 MG tablet Take 1 tablet (20 mg total) by mouth daily. 90 tablet 1  . pravastatin (PRAVACHOL) 20 MG tablet Take 1 tablet (20 mg total) by mouth daily. 90 tablet 3   No current facility-administered medications on file prior to visit.    Physical exam BP 112/70 mmHg  Pulse 82  Temp(Src) 98.4 F (36.9 C) (Oral)  Resp 20  Ht 5\' 8"  (1.727 m)  Wt 210 lb (95.255 kg)  BMI 31.94 kg/m2  SpO2 95%  General- elderly male in no acute distress Head- atraumatic, normocephalic Eyes- PERRLA, EOMI, no pallor, no icterus Neck- no lymphadenopathy Cardiovascular- normal s1,s2, no murmurs Respiratory- bilateral clear to auscultation, no wheeze, no rhonchi, no crackles Abdomen- bowel sounds present, soft, non tender Musculoskeletal- able to move his upper extremities, has bilateral BKA and uses a wheelchair. Upper body strength on right side is 4/5 and 5/5 on left side. Lower extremity strength 2/5, has his prosthetics in place Neurological- no focal deficit Psychiatry- alert and oriented to person, place and time, normal mood and affect   Labs Lab Results  Component Value Date   HGBA1C 7.1* 09/26/2013   Lipid Panel     Component Value Date/Time   CHOL 168 09/26/2013 1544   TRIG 158* 09/26/2013 1544   HDL 35* 09/26/2013 1544   CHOLHDL 4.8 09/26/2013 1544   LDLCALC 101* 09/26/2013 1544    CMP Latest Ref Rng 09/26/2013 06/13/2013 04/23/2013  Glucose 65 - 99 mg/dL 107(H) 195(H) 218(H)  BUN 8 - 27 mg/dL 39(H) 36(H) 38(H)  Creatinine 0.76 - 1.27 mg/dL 3.72(H) 2.84(H) 3.17(H)  Sodium 134 - 144 mmol/L 142 142 138  Potassium 3.5 - 5.2 mmol/L 5.9(H) 5.0 5.2  Chloride 97 - 108 mmol/L 106 103 102  CO2 18 - 29 mmol/L 23 25 20   Calcium 8.6 - 10.2 mg/dL 9.5 9.0 9.8  Total Protein 6.0 - 8.5 g/dL 6.4 - -  Albumin 3.5 - 4.8 g/dL 3.9 - -    Total Bilirubin 0.0 - 1.2 mg/dL <0.2 - -  Alkaline Phos 39 - 117 IU/L 101 - -  AST 0 - 40 IU/L 12 - -  ALT 0 - 44 IU/L 10 - -    Assessment/plan  1. Diabetes mellitus with peripheral vascular disease Continue levemir 22 u, check a1c today, continue SSI, aspirin, pravastatin. Not on ACEI with his ckd. prevnar 13 provided - CBC with Differential - CMP - Lipid Panel - Hemoglobin A1c  2. Essential hypertension Stable, continue hydralazine 50 mg bid, amlodipine 10 mg daily and toprol xl 25 mg daily -  CBC with Differential  3. Gastroesophageal reflux disease without esophagitis Continue protonix 20 mg daily - CBC with Differential  4. Other constipation D/c linzess, continue miralax  5. CKD (chronic kidney disease) stage 4, GFR 15-29 ml/min Progressive decline, does not want dialysis. Monitor clinically, no uremic symptoms at present. Avoid nsaids  6. Hyperlipidemia with target LDL less than 100 Continue pravachol, check lipid prior to next visit  7. Goals of care, counseling/discussion Pt to be full code, advance directive paperwork provided  8. CAD Remains chest pain free, continue aspirin, plavix, b blocker and hydralazine  9. PAD  s/p b/l BKA. Continue norco 5-325 prn, change from q8h prn to q12h prn. Continue lyrica

## 2014-04-03 ENCOUNTER — Ambulatory Visit: Payer: Medicare Other | Admitting: Physical Therapy

## 2014-04-03 ENCOUNTER — Encounter: Payer: Self-pay | Admitting: Physical Therapy

## 2014-04-03 DIAGNOSIS — R269 Unspecified abnormalities of gait and mobility: Secondary | ICD-10-CM

## 2014-04-03 DIAGNOSIS — R2689 Other abnormalities of gait and mobility: Secondary | ICD-10-CM

## 2014-04-03 DIAGNOSIS — Z7409 Other reduced mobility: Secondary | ICD-10-CM

## 2014-04-03 DIAGNOSIS — Z4781 Encounter for orthopedic aftercare following surgical amputation: Secondary | ICD-10-CM | POA: Diagnosis not present

## 2014-04-03 DIAGNOSIS — Z89612 Acquired absence of left leg above knee: Secondary | ICD-10-CM | POA: Diagnosis not present

## 2014-04-03 DIAGNOSIS — Z89611 Acquired absence of right leg above knee: Secondary | ICD-10-CM | POA: Diagnosis not present

## 2014-04-03 LAB — COMPREHENSIVE METABOLIC PANEL
ALBUMIN: 3.9 g/dL (ref 3.5–4.8)
ALK PHOS: 121 IU/L — AB (ref 39–117)
ALT: 6 IU/L (ref 0–44)
AST: 14 IU/L (ref 0–40)
Albumin/Globulin Ratio: 1.6 (ref 1.1–2.5)
BUN/Creatinine Ratio: 10 (ref 10–22)
BUN: 45 mg/dL — ABNORMAL HIGH (ref 8–27)
Bilirubin Total: <0.2 mg/dL (ref 0.0–1.2)
CALCIUM: 9.1 mg/dL (ref 8.6–10.2)
CO2: 21 mmol/L (ref 18–29)
CREATININE: 4.49 mg/dL — AB (ref 0.76–1.27)
Chloride: 109 mmol/L — ABNORMAL HIGH (ref 97–108)
GFR calc Af Amer: 14 mL/min/{1.73_m2} — ABNORMAL LOW (ref >59)
GFR calc non Af Amer: 12 mL/min/{1.73_m2} — ABNORMAL LOW (ref >59)
Globulin, Total: 2.5 g/dL (ref 1.5–4.5)
Glucose: 61 mg/dL — ABNORMAL LOW (ref 65–99)
POTASSIUM: 6.4 mmol/L — AB (ref 3.5–5.2)
SODIUM: 144 mmol/L (ref 134–144)
Total Protein: 6.4 g/dL (ref 6.0–8.5)

## 2014-04-03 LAB — CBC WITH DIFFERENTIAL/PLATELET
BASOS: 1 %
Basophils Absolute: 0.1 10*3/uL (ref 0.0–0.2)
EOS ABS: 0.2 10*3/uL (ref 0.0–0.4)
Eos: 3 %
HCT: 36 % — ABNORMAL LOW (ref 37.5–51.0)
Hemoglobin: 11 g/dL — ABNORMAL LOW (ref 12.6–17.7)
IMMATURE GRANULOCYTES: 0 %
Immature Grans (Abs): 0 10*3/uL (ref 0.0–0.1)
Lymphocytes Absolute: 2 10*3/uL (ref 0.7–3.1)
Lymphs: 32 %
MCH: 28.3 pg (ref 26.6–33.0)
MCHC: 30.6 g/dL — ABNORMAL LOW (ref 31.5–35.7)
MCV: 93 fL (ref 79–97)
MONOS ABS: 0.4 10*3/uL (ref 0.1–0.9)
Monocytes: 6 %
Neutrophils Absolute: 3.6 10*3/uL (ref 1.4–7.0)
Neutrophils Relative %: 58 %
PLATELETS: 211 10*3/uL (ref 150–379)
RBC: 3.89 x10E6/uL — ABNORMAL LOW (ref 4.14–5.80)
RDW: 14.9 % (ref 12.3–15.4)
WBC: 6.2 10*3/uL (ref 3.4–10.8)

## 2014-04-03 LAB — LIPID PANEL
Chol/HDL Ratio: 4.1 ratio units (ref 0.0–5.0)
Cholesterol, Total: 147 mg/dL (ref 100–199)
HDL: 36 mg/dL — ABNORMAL LOW (ref >39)
LDL Calculated: 97 mg/dL (ref 0–99)
Triglycerides: 68 mg/dL (ref 0–149)
VLDL Cholesterol Cal: 14 mg/dL (ref 5–40)

## 2014-04-03 LAB — HEMOGLOBIN A1C
Est. average glucose Bld gHb Est-mCnc: 160 mg/dL
HEMOGLOBIN A1C: 7.2 % — AB (ref 4.8–5.6)

## 2014-04-03 NOTE — Therapy (Signed)
[facility] [address] [place], [place], [address] Phone: [phone]   Fax:  [phone]  Physical Therapy Treatment  Patient Details  Name: Aaron Long MRN: [id number] Date of Birth: [birth year] Referring Provider:  Blanchie Serve, MD  Encounter Date: 04/03/2014      PT End of Session - 04/03/14 1106    Visit Number 8   Number of Visits 17   Date for PT Re-Evaluation 05/03/14   PT Start Time 1102   PT Stop Time 1145   PT Time Calculation (min) 43 min   Equipment Utilized During Treatment Gait belt   Activity Tolerance Patient tolerated treatment well   Behavior During Therapy [facility] for tasks assessed/performed      Past Medical History  Diagnosis Date  . Prostate cancer 2010  . PAD (peripheral artery disease)   . Gangrene of toe     dry  . Neuromuscular disorder     diabetic neuropathy  . Hypertension     takes Amlodipine daily and Metoprolol bid  . Hyperlipidemia     takes Pravastatin daily  . Myocardial infarction 2005  . Peripheral neuropathy   . Constipation     takes Miralax daily  . Hemorrhoids   . Hx of colonic polyps   . History of kidney stones   . Diabetes mellitus     Novolog and Levemir daily  . Coronary artery disease   . BPH (benign prostatic hypertrophy)   . Abnormality of gait   . Coronary atherosclerosis of native coronary artery   . Phlebitis and thrombophlebitis of other deep vessels of lower extremities   . Hypertrophy of prostate without urinary obstruction and other lower urinary tract symptoms (LUTS)   . Disorder of bone and cartilage, unspecified   . Lower limb amputation, below knee   . Long term (current) use of anticoagulants   . Anemia 06/19/2012  . Thyroid disease     Secondary Hyperpara- Thyroidism, Renal  . Chronic kidney disease     Stage IV    Past Surgical History  Procedure Laterality Date  . Prostate surgery  2009    prostatectomy  . Knee cartilage  surgery  at age 60    left knee  . Cea      Right  . Coronary artery bypass graft  2005    x 4  . Carotid endarterectomy  2005    right  . Cataract surgery  2011    bilateral  . Colonoscopy    . Cardiac catheterization  05/28/11  . Amputation  06/14/2011    Procedure: AMPUTATION DIGIT;  Surgeon: Elam Dutch, MD;  Location: [facility] OR;  Service: Vascular;  Laterality: Left;  Amputation Left fifth toe  . Amputation  06/16/2011    Procedure: AMPUTATION BELOW KNEE;  Surgeon: Elam Dutch, MD;  Location: [place];  Service: Vascular;  Laterality: Left;  . I&d extremity  01/31/2012    Procedure: IRRIGATION AND DEBRIDEMENT EXTREMITY;  Surgeon: Elam Dutch, MD;  Location: [place];  Service: Vascular;  Laterality: Left;  I & D Left BKA   . Amputation Right 06/12/2012    Procedure: AMPUTATION DIGIT;  Surgeon: Elam Dutch, MD;  Location: [facility] OR;  Service: Vascular;  Laterality: Right;  GREAT TOE  . Below knee leg amputation Right 08/08/2012    Dr Oneida Alar  . Amputation Right 08/08/2012    Procedure: AMPUTATION BELOW KNEE;  Surgeon: Elam Dutch, MD;  Location: [facility]  OR;  Service: Vascular;  Laterality: Right;  . Abdominal aortagram N/A 05/28/2011    Procedure: ABDOMINAL AORTAGRAM;  Surgeon: Elam Dutch, MD;  Location: [facility] CATH LAB;  Service: Cardiovascular;  Laterality: N/A;  . Abdominal aortagram N/A 06/02/2012    Procedure: ABDOMINAL Maxcine Ham;  Surgeon: Elam Dutch, MD;  Location: [facility] CATH LAB;  Service: Cardiovascular;  Laterality: N/A;  . Abdominal aortagram N/A 06/09/2012    Procedure: ABDOMINAL Maxcine Ham;  Surgeon: Elam Dutch, MD;  Location: [facility] CATH LAB;  Service: Cardiovascular;  Laterality: N/A;    There were no vitals filed for this visit.  Visit Diagnosis:  Impaired functional mobility and activity tolerance  Abnormality of gait  Balance problems      Subjective Assessment - 04/03/14 1105    Symptoms No new complaints. No falls or pain to report. Was tired after  last session, went home and slept all day.   Currently in Pain? No/denies                       [facility] Adult PT Treatment/Exercise - 04/03/14 1107    Transfers   Sit to Stand 4: Min assist;With upper extremity assist;With armrests;From chair/3-in-1;From bed  x 8-9 reps total   Sit to Stand Details (indicate cue type and reason) cues to scoot forward, for bil foot position and movement  and for anterior weight shifting to assist with standing up                    Stand to Sit 4: Min assist;With armrests;With upper extremity assist;To chair/3-in-1;To bed  x 8-9 reps total   Stand to Sit Details cues to look back to ensure he is alinged with surface before sitting down and for use of arms to control descent.   Stand Pivot Transfers 4: Min guard  with walker and prostheses   Ambulation/Gait   Ambulation/Gait Yes   Ambulation/Gait Assistance 4: Min assist   Ambulation/Gait Assistance Details min verbal/visual/tactile cues on posture, step length, base of support and foot placement with intitial contact during gait.                              Ambulation Distance (Feet) 80 Feet  x 3 reps   Assistive device Rolling walker;Prostheses   Gait Pattern Step-to pattern;Decreased step length - right;Decreased step length - left;Decreased stride length;Right flexed knee in stance;Left flexed knee in stance;Trunk flexed;Narrow base of support   Ambulation Surface Level;Indoor   Prosthetics   Current prosthetic wear tolerance (days/week)  7 days   Current prosthetic wear tolerance (#hours/day)  all awake hour hours  drying 1 x day   Residual limb condition  intact    Education Provided Residual limb care;Correct ply sock adjustment  drying during day   Person(s) Educated Patient   Education Method Explanation   Education Method Verbalized understanding   Donning Prosthesis Modified independent (device/increased time)   Doffing Prosthesis Modified independent (device/increased time)      Standing by mat with walker support: min guard assist to min assist for balance - alternating UE raises x 10 each side - alternating upper trunk rotation x 5 each way - with dowel rod (bil knees blocked to prevent buckling) shoulder raises from walker handles up and then back down x 5 reps - with red theraband: single arm rows x 10 each side with knees blocked as before  PT Short Term Goals - 03/07/14 0930    PT SHORT TERM GOAL #1   Title tolerates wear >10 hours /day without skin issues or pain (Target Date: 04/05/14)   Time 4   Period Weeks   Status New   PT SHORT TERM GOAL #2   Title donnes prostheses correctly independently (Target Date: 04/05/14)   Time 4   Period Weeks   Status New   PT SHORT TERM GOAL #3   Title sit to/from stand w/c to RW with supervision. (Target Date: 04/05/14)   Time 4   Period Weeks   Status New   PT SHORT TERM GOAL #4   Title ambulates 20' with RW & prostheses with minimal assist. (Target Date: 04/05/14)   Time 4   Period Weeks   Status New           PT Long Term Goals - 03/07/14 0930    PT LONG TERM GOAL #1   Title independent prosthetic care correctly. (Target Date: 05/03/14)   Time 8   Period Weeks   Status New   PT LONG TERM GOAL #2   Title wears prostheses >80% of awake hours daily without skin or pain issues.  (Target Date: 05/03/14)   Time 8   Period Weeks   Status New   PT LONG TERM GOAL #3   Title ambulates 100' with RW & prostheses modified independent including sit to/from stand.  (Target Date: 05/03/14)   Time 8   Period Weeks   Status New   PT LONG TERM GOAL #4   Title reaches 10" & manages clothes in standing with RW support modified independent.  (Target Date: 05/03/14)   Time 8   Period Weeks   Status New           Plan - 04/03/14 1106    Clinical Impression Statement Pt continues to make progress toward his goals. Challenged with balance and strengthening activites, no increase in pain reported.   Pt  will benefit from skilled therapeutic intervention in order to improve on the following deficits Abnormal gait;Decreased balance;Decreased activity tolerance;Decreased endurance;Decreased knowledge of use of DME;Decreased mobility;Decreased range of motion;Decreased strength   Rehab Potential Good   PT Frequency 2x / week   PT Duration 8 weeks   PT Treatment/Interventions ADLs/Self Care Home Management;DME Instruction;Gait training;Stair training;Functional mobility training;Therapeutic activities;Therapeutic exercise;Balance training;Neuromuscular re-education   PT Next Visit Plan gait & balance with RW, strength   Consulted and Agree with Plan of Care Patient        Problem List Patient Active Problem List   Diagnosis Date Noted  . Status post bilateral below knee amputation 04/02/2014  . Other constipation 09/26/2013  . Cervical disc disorder with radiculopathy of cervical region 08/07/2013  . Drug-induced constipation 06/13/2013  . DM type 2, uncontrolled, with renal complications 32/95/1884  . Hypertensive renal disease 06/13/2013  . Encounter for therapeutic drug monitoring 06/13/2013  . DM type 2, uncontrolled, with neuropathy 01/23/2013  . Hyperlipidemia with target LDL less than 100 12/06/2012  . Need for prophylactic vaccination and inoculation against influenza 09/30/2012  . Diabetes mellitus with peripheral vascular disease 09/06/2012  . GERD (gastroesophageal reflux disease) 09/06/2012  . Aftercare following surgery of the circulatory system, [place] 08/03/2012  . Anemia in chronic kidney disease(285.21) 07/24/2012  . Atherosclerosis of native arteries of the extremities with ulceration(440.23) 06/01/2012  . Hyperkalemia 04/24/2012  . Diabetic neuropathy, type II diabetes mellitus 07/27/2011  . Atherosclerosis of native arteries of the  extremities with intermittent claudication 07/08/2011  . S/P BKA (below knee amputation) 06/22/2011  . Atherosclerosis of native arteries of  the extremities with gangrene 06/10/2011  . CKD (chronic kidney disease) stage 4, GFR 15-29 ml/min 05/02/2011  . HTN (hypertension) 04/28/2011  . CAD (coronary artery disease) 04/28/2011  . PAD (peripheral artery disease) 04/28/2011  . Prostate cancer 04/28/2011    Willow Ora 04/03/2014, 1:53 PM  Willow Ora, PTA, [place] [address], [place], [place] [address] [phone] 04/03/2014, 1:53 PM

## 2014-04-04 ENCOUNTER — Other Ambulatory Visit: Payer: Self-pay

## 2014-04-04 MED ORDER — SODIUM POLYSTYRENE SULFONATE 15 GM/60ML PO SUSP: 15.0000 g | Freq: Two times a day (BID) | ORAL | Status: DC

## 2014-04-08 ENCOUNTER — Observation Stay (HOSPITAL_COMMUNITY)
Admission: EM | Admit: 2014-04-08 | Discharge: 2014-04-09 | Disposition: A | Discharge status: Home / Self Care | Payer: Medicare Other | Attending: Internal Medicine | Admitting: Internal Medicine

## 2014-04-08 ENCOUNTER — Telehealth: Payer: Self-pay

## 2014-04-08 ENCOUNTER — Ambulatory Visit: Payer: Medicare Other | Admitting: Physical Therapy

## 2014-04-08 ENCOUNTER — Encounter (HOSPITAL_COMMUNITY): Payer: Self-pay | Admitting: Neurology

## 2014-04-08 ENCOUNTER — Emergency Department (HOSPITAL_COMMUNITY): Payer: Medicare Other

## 2014-04-08 DIAGNOSIS — Z87442 Personal history of urinary calculi: Secondary | ICD-10-CM | POA: Insufficient documentation

## 2014-04-08 DIAGNOSIS — Z886 Allergy status to analgesic agent status: Secondary | ICD-10-CM | POA: Diagnosis not present

## 2014-04-08 DIAGNOSIS — K219 Gastro-esophageal reflux disease without esophagitis: Secondary | ICD-10-CM | POA: Diagnosis not present

## 2014-04-08 DIAGNOSIS — K5903 Drug induced constipation: Secondary | ICD-10-CM | POA: Diagnosis present

## 2014-04-08 DIAGNOSIS — Z86718 Personal history of other venous thrombosis and embolism: Secondary | ICD-10-CM | POA: Diagnosis not present

## 2014-04-08 DIAGNOSIS — Z89512 Acquired absence of left leg below knee: Secondary | ICD-10-CM | POA: Diagnosis not present

## 2014-04-08 DIAGNOSIS — R0789 Other chest pain: Principal | ICD-10-CM | POA: Insufficient documentation

## 2014-04-08 DIAGNOSIS — Z89511 Acquired absence of right leg below knee: Secondary | ICD-10-CM | POA: Insufficient documentation

## 2014-04-08 DIAGNOSIS — Z9842 Cataract extraction status, left eye: Secondary | ICD-10-CM | POA: Insufficient documentation

## 2014-04-08 DIAGNOSIS — N179 Acute kidney failure, unspecified: Secondary | ICD-10-CM | POA: Diagnosis present

## 2014-04-08 DIAGNOSIS — Z7902 Long term (current) use of antithrombotics/antiplatelets: Secondary | ICD-10-CM | POA: Insufficient documentation

## 2014-04-08 DIAGNOSIS — R079 Chest pain, unspecified: Secondary | ICD-10-CM | POA: Diagnosis not present

## 2014-04-08 DIAGNOSIS — Z7982 Long term (current) use of aspirin: Secondary | ICD-10-CM | POA: Diagnosis not present

## 2014-04-08 DIAGNOSIS — N185 Chronic kidney disease, stage 5: Secondary | ICD-10-CM | POA: Diagnosis not present

## 2014-04-08 DIAGNOSIS — E875 Hyperkalemia: Secondary | ICD-10-CM | POA: Diagnosis not present

## 2014-04-08 DIAGNOSIS — I739 Peripheral vascular disease, unspecified: Secondary | ICD-10-CM | POA: Diagnosis not present

## 2014-04-08 DIAGNOSIS — E785 Hyperlipidemia, unspecified: Secondary | ICD-10-CM | POA: Diagnosis not present

## 2014-04-08 DIAGNOSIS — I255 Ischemic cardiomyopathy: Secondary | ICD-10-CM | POA: Diagnosis not present

## 2014-04-08 DIAGNOSIS — Z794 Long term (current) use of insulin: Secondary | ICD-10-CM | POA: Diagnosis not present

## 2014-04-08 DIAGNOSIS — Z87891 Personal history of nicotine dependence: Secondary | ICD-10-CM | POA: Diagnosis not present

## 2014-04-08 DIAGNOSIS — E1151 Type 2 diabetes mellitus with diabetic peripheral angiopathy without gangrene: Secondary | ICD-10-CM | POA: Diagnosis not present

## 2014-04-08 DIAGNOSIS — I252 Old myocardial infarction: Secondary | ICD-10-CM | POA: Insufficient documentation

## 2014-04-08 DIAGNOSIS — E1140 Type 2 diabetes mellitus with diabetic neuropathy, unspecified: Secondary | ICD-10-CM | POA: Insufficient documentation

## 2014-04-08 DIAGNOSIS — Z951 Presence of aortocoronary bypass graft: Secondary | ICD-10-CM | POA: Insufficient documentation

## 2014-04-08 DIAGNOSIS — D649 Anemia, unspecified: Secondary | ICD-10-CM | POA: Diagnosis not present

## 2014-04-08 DIAGNOSIS — Z86010 Personal history of colonic polyps: Secondary | ICD-10-CM | POA: Diagnosis not present

## 2014-04-08 DIAGNOSIS — I120 Hypertensive chronic kidney disease with stage 5 chronic kidney disease or end stage renal disease: Secondary | ICD-10-CM | POA: Insufficient documentation

## 2014-04-08 DIAGNOSIS — I10 Essential (primary) hypertension: Secondary | ICD-10-CM

## 2014-04-08 DIAGNOSIS — I160 Hypertensive urgency: Secondary | ICD-10-CM | POA: Diagnosis present

## 2014-04-08 DIAGNOSIS — Z8546 Personal history of malignant neoplasm of prostate: Secondary | ICD-10-CM | POA: Insufficient documentation

## 2014-04-08 DIAGNOSIS — K5900 Constipation, unspecified: Secondary | ICD-10-CM | POA: Diagnosis not present

## 2014-04-08 DIAGNOSIS — I2510 Atherosclerotic heart disease of native coronary artery without angina pectoris: Secondary | ICD-10-CM | POA: Diagnosis not present

## 2014-04-08 DIAGNOSIS — Z9841 Cataract extraction status, right eye: Secondary | ICD-10-CM | POA: Diagnosis not present

## 2014-04-08 DIAGNOSIS — N189 Chronic kidney disease, unspecified: Secondary | ICD-10-CM | POA: Diagnosis not present

## 2014-04-08 HISTORY — DX: Secondary hyperparathyroidism of renal origin: N25.81

## 2014-04-08 HISTORY — DX: Ischemic cardiomyopathy: I25.5

## 2014-04-08 HISTORY — DX: Acute embolism and thrombosis of unspecified deep veins of unspecified lower extremity: I82.409

## 2014-04-08 LAB — CBC
HEMATOCRIT: 33.7 % — AB (ref 39.0–52.0)
Hemoglobin: 10.7 g/dL — ABNORMAL LOW (ref 13.0–17.0)
MCH: 28.9 pg (ref 26.0–34.0)
MCHC: 31.8 g/dL (ref 30.0–36.0)
MCV: 91.1 fL (ref 78.0–100.0)
Platelets: 177 10*3/uL (ref 150–400)
RBC: 3.7 MIL/uL — ABNORMAL LOW (ref 4.22–5.81)
RDW: 14.4 % (ref 11.5–15.5)
WBC: 5.7 10*3/uL (ref 4.0–10.5)

## 2014-04-08 LAB — BASIC METABOLIC PANEL
Anion gap: 9 (ref 5–15)
BUN: 39 mg/dL — AB (ref 6–23)
CO2: 23 mmol/L (ref 19–32)
CREATININE: 4.69 mg/dL — AB (ref 0.50–1.35)
Calcium: 9.1 mg/dL (ref 8.4–10.5)
Chloride: 109 mmol/L (ref 96–112)
GFR, EST AFRICAN AMERICAN: 13 mL/min — AB (ref >90)
GFR, EST NON AFRICAN AMERICAN: 11 mL/min — AB (ref >90)
GLUCOSE: 153 mg/dL — AB (ref 70–99)
POTASSIUM: 5.1 mmol/L (ref 3.5–5.1)
Sodium: 141 mmol/L (ref 135–145)

## 2014-04-08 LAB — CREATININE, SERUM
Creatinine, Ser: 4.81 mg/dL — ABNORMAL HIGH (ref 0.50–1.35)
GFR calc Af Amer: 12 mL/min — ABNORMAL LOW (ref >90)
GFR calc non Af Amer: 11 mL/min — ABNORMAL LOW (ref >90)

## 2014-04-08 LAB — TROPONIN I
Troponin I: 0.04 ng/mL — ABNORMAL HIGH (ref <0.031)
Troponin I: 0.03 ng/mL (ref <0.031)
Troponin I: 0.04 ng/mL — ABNORMAL HIGH (ref <0.031)

## 2014-04-08 LAB — GLUCOSE, CAPILLARY
Glucose-Capillary: 168 mg/dL — ABNORMAL HIGH (ref 70–99)
Glucose-Capillary: 100 mg/dL — ABNORMAL HIGH (ref 70–99)

## 2014-04-08 LAB — I-STAT TROPONIN, ED: Troponin i, poc: 0.04 ng/mL (ref 0.00–0.08)

## 2014-04-08 MED ORDER — AMLODIPINE BESYLATE 10 MG PO TABS
10.0000 mg | ORAL_TABLET | Freq: Every day | ORAL | Status: DC
  Administered 2014-04-09: 10 mg via ORAL
  Filled 2014-04-08: qty 1

## 2014-04-08 MED ORDER — ONDANSETRON HCL 4 MG/2ML IJ SOLN: 4.0000 mg | Freq: Four times a day (QID) | INTRAMUSCULAR | Status: DC | PRN

## 2014-04-08 MED ORDER — PANTOPRAZOLE SODIUM 20 MG PO TBEC
20.0000 mg | DELAYED_RELEASE_TABLET | Freq: Every day | ORAL | Status: DC
Start: 2014-04-09 — End: 2014-04-09
  Administered 2014-04-09: 20 mg via ORAL
  Filled 2014-04-08: qty 1

## 2014-04-08 MED ORDER — ACETAMINOPHEN 325 MG PO TABS: 650.0000 mg | ORAL_TABLET | ORAL | Status: DC | PRN

## 2014-04-08 MED ORDER — PREGABALIN 75 MG PO CAPS
75.0000 mg | ORAL_CAPSULE | Freq: Two times a day (BID) | ORAL | Status: DC
  Administered 2014-04-08 – 2014-04-09 (×2): 75 mg via ORAL
  Filled 2014-04-08 (×2): qty 1

## 2014-04-08 MED ORDER — HEPARIN SODIUM (PORCINE) 5000 UNIT/ML IJ SOLN
5000.0000 [IU] | Freq: Three times a day (TID) | INTRAMUSCULAR | Status: DC
  Administered 2014-04-08 – 2014-04-09 (×3): 5000 [IU] via SUBCUTANEOUS
  Filled 2014-04-08 (×5): qty 1

## 2014-04-08 MED ORDER — INSULIN DETEMIR 100 UNIT/ML FLEXPEN: 20.0000 [IU] | PEN_INJECTOR | Freq: Every day | SUBCUTANEOUS | Status: DC

## 2014-04-08 MED ORDER — ASPIRIN EC 81 MG PO TBEC
81.0000 mg | DELAYED_RELEASE_TABLET | Freq: Every morning | ORAL | Status: DC
  Administered 2014-04-09: 81 mg via ORAL
  Filled 2014-04-08: qty 1

## 2014-04-08 MED ORDER — HYDRALAZINE HCL 50 MG PO TABS
100.0000 mg | ORAL_TABLET | Freq: Three times a day (TID) | ORAL | Status: DC
  Administered 2014-04-09 (×2): 100 mg via ORAL
  Filled 2014-04-08 (×7): qty 2

## 2014-04-08 MED ORDER — POLYETHYLENE GLYCOL 3350 17 G PO PACK
17.0000 g | PACK | Freq: Every day | ORAL | Status: DC
  Filled 2014-04-08: qty 1

## 2014-04-08 MED ORDER — DOCUSATE SODIUM 100 MG PO CAPS
100.0000 mg | ORAL_CAPSULE | Freq: Two times a day (BID) | ORAL | Status: DC
  Administered 2014-04-08 – 2014-04-09 (×2): 100 mg via ORAL
  Filled 2014-04-08 (×3): qty 1

## 2014-04-08 MED ORDER — INSULIN ASPART 100 UNIT/ML ~~LOC~~ SOLN
0.0000 [IU] | Freq: Three times a day (TID) | SUBCUTANEOUS | Status: DC
  Administered 2014-04-08 – 2014-04-09 (×3): 4 [IU] via SUBCUTANEOUS

## 2014-04-08 MED ORDER — POLYETHYLENE GLYCOL 3350 17 G PO PACK
17.0000 g | PACK | Freq: Two times a day (BID) | ORAL | Status: DC
  Administered 2014-04-09: 17 g via ORAL
  Filled 2014-04-08 (×2): qty 1

## 2014-04-08 MED ORDER — HYDRALAZINE HCL 50 MG PO TABS
50.0000 mg | ORAL_TABLET | Freq: Two times a day (BID) | ORAL | Status: DC
  Filled 2014-04-08: qty 1

## 2014-04-08 MED ORDER — HYDRALAZINE HCL 20 MG/ML IJ SOLN
10.0000 mg | Freq: Four times a day (QID) | INTRAMUSCULAR | Status: DC | PRN
Start: 2014-04-08 — End: 2014-04-09
  Administered 2014-04-08: 10 mg via INTRAVENOUS
  Filled 2014-04-08: qty 1

## 2014-04-08 MED ORDER — CLOPIDOGREL BISULFATE 75 MG PO TABS
75.0000 mg | ORAL_TABLET | Freq: Every day | ORAL | Status: DC
Start: 2014-04-09 — End: 2014-04-09
  Administered 2014-04-09: 75 mg via ORAL
  Filled 2014-04-08: qty 1

## 2014-04-08 MED ORDER — PRAVASTATIN SODIUM 20 MG PO TABS
20.0000 mg | ORAL_TABLET | Freq: Every day | ORAL | Status: DC
Start: 2014-04-09 — End: 2014-04-09
  Administered 2014-04-09: 20 mg via ORAL
  Filled 2014-04-08: qty 1

## 2014-04-08 MED ORDER — HYDROCODONE-ACETAMINOPHEN 5-325 MG PO TABS: 1.0000 | ORAL_TABLET | Freq: Two times a day (BID) | ORAL | Status: DC | PRN

## 2014-04-08 MED ORDER — INSULIN DETEMIR 100 UNIT/ML ~~LOC~~ SOLN
20.0000 [IU] | Freq: Every day | SUBCUTANEOUS | Status: DC
  Administered 2014-04-08: 20 [IU] via SUBCUTANEOUS
  Filled 2014-04-08 (×2): qty 0.2

## 2014-04-08 MED ORDER — METOPROLOL SUCCINATE ER 25 MG PO TB24
25.0000 mg | ORAL_TABLET | Freq: Every day | ORAL | Status: DC
  Administered 2014-04-09: 25 mg via ORAL
  Filled 2014-04-08: qty 1

## 2014-04-08 MED ORDER — DOXAZOSIN MESYLATE 1 MG PO TABS
1.0000 mg | ORAL_TABLET | Freq: Every day | ORAL | Status: DC
  Administered 2014-04-08: 1 mg via ORAL
  Filled 2014-04-08 (×2): qty 1

## 2014-04-08 NOTE — Telephone Encounter (Signed)
noted 

## 2014-04-08 NOTE — ED Notes (Signed)
Pt given [place] sandwich and diet coke

## 2014-04-08 NOTE — ED Provider Notes (Signed)
CSN: [id number]     Arrival date & time 04/08/14  6384 History   First MD Initiated Contact with Patient 04/08/14 [phone]     Chief Complaint  Patient presents with  . Chest Pain     (Consider location/radiation/quality/duration/timing/severity/associated sxs/prior Treatment) The history is provided by the patient, a relative and the EMS personnel.   74 year old male brought in by EMS. Patient with onset of left-sided chest pain described as a tightness between 3 and 5 in the morning. EMS called out. Patient's chest pain at that time was 6 out of 10 he was given aspirin 3 sublingual nitroglycerin with resolution of the pain. Currently no discomfort at all. Patient has a history of diabetes and known coronary artery disease and renal insufficiency. Patient is followed by [facility].  Patient had blood work done on Tuesday and family was notified on Thursday that the potassium was elevated. That has not been addressed. He was given some medicine to take over the weekend.  Past Medical History  Diagnosis Date  . Prostate cancer 2010  . PAD (peripheral artery disease)   . Gangrene of toe     dry  . Neuromuscular disorder     diabetic neuropathy  . Hypertension     takes Amlodipine daily and Metoprolol bid  . Hyperlipidemia     takes Pravastatin daily  . Myocardial infarction 2005  . Peripheral neuropathy   . Constipation     takes Miralax daily  . Hemorrhoids   . Hx of colonic polyps   . History of kidney stones   . Diabetes mellitus     Novolog and Levemir daily  . Coronary artery disease   . BPH (benign prostatic hypertrophy)   . Abnormality of gait   . Coronary atherosclerosis of native coronary artery   . Phlebitis and thrombophlebitis of other deep vessels of lower extremities   . Hypertrophy of prostate without urinary obstruction and other lower urinary tract symptoms (LUTS)   . Disorder of bone and cartilage, unspecified   . Lower limb amputation, below knee   .  Long term (current) use of anticoagulants   . Anemia 06/19/2012  . Thyroid disease     Secondary Hyperpara- Thyroidism, Renal  . Chronic kidney disease     Stage IV   Past Surgical History  Procedure Laterality Date  . Prostate surgery  2009    prostatectomy  . Knee cartilage surgery  at age 61    left knee  . Cea      Right  . Coronary artery bypass graft  2005    x 4  . Carotid endarterectomy  2005    right  . Cataract surgery  2011    bilateral  . Colonoscopy    . Cardiac catheterization  05/28/11  . Amputation  06/14/2011    Procedure: AMPUTATION DIGIT;  Surgeon: Elam Dutch, MD;  Location: [facility] OR;  Service: Vascular;  Laterality: Left;  Amputation Left fifth toe  . Amputation  06/16/2011    Procedure: AMPUTATION BELOW KNEE;  Surgeon: Elam Dutch, MD;  Location: [place];  Service: Vascular;  Laterality: Left;  . I&d extremity  01/31/2012    Procedure: IRRIGATION AND DEBRIDEMENT EXTREMITY;  Surgeon: Elam Dutch, MD;  Location: [place];  Service: Vascular;  Laterality: Left;  I & D Left BKA   . Amputation Right 06/12/2012    Procedure: AMPUTATION DIGIT;  Surgeon: Elam Dutch, MD;  Location: [place];  Service:  Vascular;  Laterality: Right;  GREAT TOE  . Below knee leg amputation Right 08/08/2012    Dr Oneida Alar  . Amputation Right 08/08/2012    Procedure: AMPUTATION BELOW KNEE;  Surgeon: Elam Dutch, MD;  Location: [facility] OR;  Service: Vascular;  Laterality: Right;  . Abdominal aortagram N/A 05/28/2011    Procedure: ABDOMINAL Maxcine Ham;  Surgeon: Elam Dutch, MD;  Location: [facility] CATH LAB;  Service: Cardiovascular;  Laterality: N/A;  . Abdominal aortagram N/A 06/02/2012    Procedure: ABDOMINAL Maxcine Ham;  Surgeon: Elam Dutch, MD;  Location: [facility] CATH LAB;  Service: Cardiovascular;  Laterality: N/A;  . Abdominal aortagram N/A 06/09/2012    Procedure: ABDOMINAL Maxcine Ham;  Surgeon: Elam Dutch, MD;  Location: [facility] CATH LAB;  Service: Cardiovascular;  Laterality: N/A;    Family History  Problem Relation Age of Onset  . Coronary artery disease Neg Hx   . Anesthesia problems Neg Hx   . Hypotension Neg Hx   . Malignant hyperthermia Neg Hx   . Pseudochol deficiency Neg Hx   . Hyperlipidemia Mother   . Hypertension Mother   . Cancer Mother   . Hyperlipidemia Father   . Hypertension Father   . Kidney disease Father   . Heart disease Sister   . Alzheimer's disease Sister   . Cancer Brother   . Other Sister    History  Substance Use Topics  . Smoking status: Former Smoker -- 1.00 packs/day for 40 years    Types: Cigarettes    Quit date: 04/28/1991  . Smokeless tobacco: Never Used     Comment: occ alcohol  . Alcohol Use: No    Review of Systems  Constitutional: Negative for fever.  HENT: Negative for congestion.   Eyes: Negative for redness.  Respiratory: Negative for shortness of breath.   Cardiovascular: Positive for chest pain.  Gastrointestinal: Negative for abdominal pain.  Genitourinary: Negative for dysuria.  Musculoskeletal: Negative for back pain.  Skin: Negative for rash.  Neurological: Negative for headaches.  Hematological: Does not bruise/bleed easily.  Psychiatric/Behavioral: Negative for confusion.      Allergies  Oxycodone  Home Medications   Prior to Admission medications   Medication Sig Start Date End Date Taking? Authorizing Provider  AMBULATORY NON FORMULARY MEDICATION BD U/P Mini Pen [place] 31GX5MM Use three times daily as directed DX:250.00 08/07/13   Blanchie Serve, MD  AMBULATORY NON FORMULARY MEDICATION One Touch Ultra 2 Test Strips Sig:  Check blood sugars Three times daily to keep blood sugars under control Dx: 250.00, 401.9 11/26/13   Blanchie Serve, MD  amLODipine (NORVASC) 10 MG tablet take 1 tablet by mouth once daily 12/28/13   Blanchie Serve, MD  aspirin EC 81 MG tablet Take 1 tablet (81 mg total) by mouth every morning. 08/21/12   Lavon Paganini Angiulli, PA-C  clopidogrel (PLAVIX) 75 MG tablet take 1  tablet by mouth once daily 02/11/14   Blanchie Serve, MD  doxazosin (CARDURA) 1 MG tablet take 1 tablet by mouth at bedtime 02/11/14   Blanchie Serve, MD  hydrALAZINE (APRESOLINE) 50 MG tablet Take 1 tablet (50 mg total) by mouth 2 (two) times daily. 01/01/14   Blanchie Serve, MD  HYDROcodone-acetaminophen (NORCO/VICODIN) 5-325 MG per tablet Take 1 tablet by mouth every 12 (twelve) hours as needed for moderate pain. 04/02/14   Blanchie Serve, MD  insulin aspart (NOVOLOG) 100 UNIT/ML FlexPen Inject 5 Units into the skin 3 (three) times daily with meals. Inject 5 units three times a day  with meals if blood sugar greater than 150 only, DX: 250.71 08/07/13   Blanchie Serve, MD  Insulin Detemir (LEVEMIR) 100 UNIT/ML Pen Inject 22 Units into the skin at bedtime. 01/28/14   Tiffany L Reed, DO  LEVEMIR FLEXTOUCH 100 UNIT/ML Pen inject 22 units subcutaneously at bedtime 01/29/14   Blanchie Serve, MD  LYRICA 75 MG capsule take 1 capsule by mouth twice a day 02/11/14   Blanchie Serve, MD  metoprolol succinate (TOPROL-XL) 25 MG 24 hr tablet Take 1 tablet (25 mg total) by mouth daily. 08/07/13   Blanchie Serve, MD  Misc. Devices [facility] [place] Patient needs new Wheelchair due to his being broken. 01/09/14   Blanchie Serve, MD  pantoprazole (PROTONIX) 20 MG tablet Take 1 tablet (20 mg total) by mouth daily. 08/07/13   Blanchie Serve, MD  pravastatin (PRAVACHOL) 20 MG tablet Take 1 tablet (20 mg total) by mouth daily. 08/07/13   Blanchie Serve, MD  sodium polystyrene (KAYEXALATE) 15 GM/60ML suspension Take 60 mLs (15 g total) by mouth 2 (two) times daily. One day 04/04/14   Blanchie Serve, MD   BP 189/79 mmHg  Pulse 71  Temp(Src) 98.2 F (36.8 C) (Oral)  Resp 14  SpO2 98% Physical Exam  Constitutional: He is oriented to person, place, and time. He appears well-developed and well-nourished. No distress.  HENT:  Head: Normocephalic and atraumatic.  Eyes: Conjunctivae and EOM are normal. Pupils are equal, round, and reactive to  light.  Neck: Normal range of motion.  Cardiovascular: Normal rate, regular rhythm and normal heart sounds.   No murmur heard. Pulmonary/Chest: Effort normal and breath sounds normal. No respiratory distress.  Abdominal: Bowel sounds are normal. There is no tenderness.  Musculoskeletal: Normal range of motion.  Bilateral below-the-knee amp dictations.  Neurological: He is alert and oriented to person, place, and time. No cranial nerve deficit. He exhibits normal muscle tone. Coordination normal.  Nursing note and vitals reviewed.   ED Course  Procedures (including critical care time) Labs Review Labs Reviewed  CBC - Abnormal; Notable for the following:    RBC 3.70 (*)    Hemoglobin 10.7 (*)    HCT 33.7 (*)    All other components within normal limits  BASIC METABOLIC PANEL - Abnormal; Notable for the following:    Glucose, Bld 153 (*)    BUN 39 (*)    Creatinine, Ser 4.69 (*)    GFR calc non Af Amer 11 (*)    GFR calc Af Amer 13 (*)    All other components within normal limits  I-STAT TROPOININ, ED   Results for orders placed or performed during the hospital encounter of 04/08/14  CBC  Result Value Ref Range   WBC 5.7 4.0 - 10.5 K/uL   RBC 3.70 (L) 4.22 - 5.81 MIL/uL   Hemoglobin 10.7 (L) 13.0 - 17.0 g/dL   HCT 33.7 (L) 39.0 - 52.0 %   MCV 91.1 78.0 - 100.0 fL   MCH 28.9 26.0 - 34.0 pg   MCHC 31.8 30.0 - 36.0 g/dL   RDW 14.4 11.5 - 15.5 %   Platelets 177 150 - 400 K/uL  Basic metabolic panel  Result Value Ref Range   Sodium 141 135 - 145 mmol/L   Potassium 5.1 3.5 - 5.1 mmol/L   Chloride 109 96 - 112 mmol/L   CO2 23 19 - 32 mmol/L   Glucose, Bld 153 (H) 70 - 99 mg/dL   BUN 39 (H) 6 - 23 mg/dL  Creatinine, Ser 4.69 (H) 0.50 - 1.35 mg/dL   Calcium 9.1 8.4 - 10.5 mg/dL   GFR calc non Af Amer 11 (L) >90 mL/min   GFR calc Af Amer 13 (L) >90 mL/min   Anion gap 9 5 - 15  I-stat troponin, ED (not at [facility])  Result Value Ref Range   Troponin i, poc 0.04 0.00 - 0.08  ng/mL   Comment 3             Imaging Review Dg Chest 2 View  04/08/2014   CLINICAL DATA:  Midsternal chest pain since 4 a.m. Chest tightness. History diabetes.  EXAM: CHEST  2 VIEW  COMPARISON:  11/07/2013  FINDINGS: There is mild left basilar atelectasis. There is no focal parenchymal opacity, pleural effusion, or pneumothorax. The heart and mediastinal contours are unremarkable. There is evidence of prior CABG. There is thoracic aortic atherosclerosis.  The osseous structures are unremarkable.  IMPRESSION: No active cardiopulmonary disease.   Electronically Signed   By: Kathreen Devoid   On: 04/08/2014 10:35     EKG Interpretation   Date/Time:  Monday April 08 2014 09:53:30 EDT Ventricular Rate:  78 PR Interval:  165 QRS Duration: 90 QT Interval:  373 QTC Calculation: 425 R Axis:   7 Text Interpretation:  Sinus rhythm Anteroseptal infarct, old No  significant change since last tracing Confirmed by Ceclia Koker  MD, Garnetta Fedrick  [id number]) on 04/08/2014 9:56:33 AM      MDM   Final diagnoses:  Chest pain, unspecified chest pain type    Patient with onset of chest pain this morning between 3 in the morning and 5 in the morning. Left substernal tightness feeling EMS called the pain was still 6 out of 10 at that time they gave him 3 sublingual nitros and the pain resolved. They also gave him aspirin. Patient is now pain-free. Other concern was the patient was told from blood work that was on Tuesday that he had an elevated potassium of 6.4 need to be seen for that.  Here patient's potassium is normal chest x-rays normal EKG without acute findings. Troponin is also normal. However based on his symptoms and his risk factors with diabetes and known cardiac history last cardiac cath was 2013 had bypass surgery in 2005 has not seen cardiology for several years. Will require admission for rule out. Patient has known renal insufficiency is not on dialysis.  Fredia Sorrow, MD 04/08/14 1300

## 2014-04-08 NOTE — Progress Notes (Signed)
Patient arrived from the ED. Patient A/O, VSS, NSR on the monitor, pain 0/10. POC discussed with the patient and his family. Patient currently resting comfortably. [place], RN

## 2014-04-08 NOTE — Telephone Encounter (Signed)
Patients wife called stating she is on her way to take patient to the emergency room. Mrs.Alicea was at work and her husband called and her and said he did not feel well and needs to go to the ER.   FYI

## 2014-04-08 NOTE — ED Notes (Signed)
Per EMS- Pt comes from home c/o midsternal CP this morning at 0400. "Chest tightness", given 324 aspirin. Initial EMS BP 210/98 with 6/10 cp, given 3 SL nitro, pain decreased to 1/10. Has bilateral BKAs. EKG SR. Pt reports last Thursday doctor called with potassium of 6.0. BP is a x 4. Last BP 160/100. Denies n/v/sob.

## 2014-04-08 NOTE — H&P (Signed)
[facility] History and Physical                                                                                    Aaron Long, is a 74 y.o. male  MRN: [id number]   DOB - 10-12-1940  Admit Date - 04/08/2014  Outpatient Primary MD for the patient is [name], NP  With History of -  Past Medical History  Diagnosis Date  . Prostate cancer 2010  . PAD (peripheral artery disease)   . Gangrene of toe     dry  . Neuromuscular disorder     diabetic neuropathy  . Hypertension     takes Amlodipine daily and Metoprolol bid  . Hyperlipidemia     takes Pravastatin daily  . Myocardial infarction 2005  . Peripheral neuropathy   . Constipation     takes Miralax daily  . Hemorrhoids   . Hx of colonic polyps   . History of kidney stones   . Diabetes mellitus     Novolog and Levemir daily  . Coronary artery disease   . BPH (benign prostatic hypertrophy)   . Abnormality of gait   . Coronary atherosclerosis of native coronary artery   . Phlebitis and thrombophlebitis of other deep vessels of lower extremities   . Hypertrophy of prostate without urinary obstruction and other lower urinary tract symptoms (LUTS)   . Disorder of bone and cartilage, unspecified   . Lower limb amputation, below knee   . Long term (current) use of anticoagulants   . Anemia 06/19/2012  . Thyroid disease     Secondary Hyperpara- Thyroidism, Renal  . Chronic kidney disease     Stage IV      Past Surgical History  Procedure Laterality Date  . Prostate surgery  2009    prostatectomy  . Knee cartilage surgery  at age 89    left knee  . Cea      Right  . Coronary artery bypass graft  2005    x 4  . Carotid endarterectomy  2005    right  . Cataract surgery  2011    bilateral  . Colonoscopy    . Cardiac catheterization  05/28/11  . Amputation  06/14/2011    Procedure: AMPUTATION DIGIT;  Surgeon: Elam Dutch, MD;  Location: [facility] OR;  Service: Vascular;  Laterality: Left;  Amputation Left  fifth toe  . Amputation  06/16/2011    Procedure: AMPUTATION BELOW KNEE;  Surgeon: Elam Dutch, MD;  Location: [place];  Service: Vascular;  Laterality: Left;  . I&d extremity  01/31/2012    Procedure: IRRIGATION AND DEBRIDEMENT EXTREMITY;  Surgeon: Elam Dutch, MD;  Location: [place];  Service: Vascular;  Laterality: Left;  I & D Left BKA   . Amputation Right 06/12/2012    Procedure: AMPUTATION DIGIT;  Surgeon: Elam Dutch, MD;  Location: [facility] OR;  Service: Vascular;  Laterality: Right;  GREAT TOE  . Below knee leg amputation Right 08/08/2012    Dr Oneida Alar  . Amputation Right 08/08/2012    Procedure: AMPUTATION BELOW KNEE;  Surgeon: Elam Dutch, MD;  Location: [place];  Service: Vascular;  Laterality: Right;  . Abdominal aortagram N/A 05/28/2011    Procedure: ABDOMINAL Maxcine Ham;  Surgeon: Elam Dutch, MD;  Location: [facility] CATH LAB;  Service: Cardiovascular;  Laterality: N/A;  . Abdominal aortagram N/A 06/02/2012    Procedure: ABDOMINAL Maxcine Ham;  Surgeon: Elam Dutch, MD;  Location: [facility] CATH LAB;  Service: Cardiovascular;  Laterality: N/A;  . Abdominal aortagram N/A 06/09/2012    Procedure: ABDOMINAL Maxcine Ham;  Surgeon: Elam Dutch, MD;  Location: [facility] CATH LAB;  Service: Cardiovascular;  Laterality: N/A;    in for   Chief Complaint  Patient presents with  . Chest Pain     HPI  Aaron Long  is a 74 y.o. male, with a past medical history significant for chronic kidney disease stage IV, bilateral BKA's, type 2 diabetes, coronary artery disease, hypertension, and  hyperlipidemia who presented to the emergency department today for chest tightness. The patient reports he was told by his primary care physician that he had a potassium of 6.0 and that this could potentially cause a heart attack. He was prescribed Kayexalate which he took and it worked effectively. The patient believes that he was so anxious about potentially having a heart attack that he caused himself to have  chest tightness. He states that he woke at 4 AM this morning with some chest tightness that was relieved by EMS giving him 3 sublingual nitroglycerin. At this point he denies any chest pain.  In the ER he is noted to have a blood pressure of 210/98, troponin is normal, EKG is negative, chest x-ray is negative. Blood work shows slowly worsening renal function and his creatinine is elevated from 3.72 (on 9/15-4.69 today. We will admit him to a telemetry bed for chest pain rule out.  Review of Systems   In addition to the HPI above,  No Fever-chills, No Headache, No changes with Vision or hearing, No problems swallowing food or Liquids, No Chest pain, Cough or Shortness of Breath, No Abdominal pain, No Nausea or Vomiting, Bowel movements are regular, No Blood in stool or Urine, No dysuria, No new skin rashes or bruises, No new joints pains-aches,  No new weakness, tingling, numbness in any extremity, No recent weight gain or loss, A full 10 point Review of Systems was done, except as stated above, all other Review of Systems were negative.  Social History History  Substance Use Topics  . Smoking status: Former Smoker -- 1.00 packs/day for 40 years    Types: Cigarettes    Quit date: 04/28/1991  . Smokeless tobacco: Never Used     Comment: occ alcohol  . Alcohol Use: No    Family History Family History  Problem Relation Age of Onset  . Coronary artery disease Neg Hx   . Anesthesia problems Neg Hx   . Hypotension Neg Hx   . Malignant hyperthermia Neg Hx   . Pseudochol deficiency Neg Hx   . Hyperlipidemia Mother   . Hypertension Mother   . Cancer Mother   . Hyperlipidemia Father   . Hypertension Father   . Kidney disease Father   . Heart disease Sister   . Alzheimer's disease Sister   . Cancer Brother   . Other Sister     Prior to Admission medications   Medication Sig Start Date End Date Taking? Authorizing Provider  AMBULATORY NON FORMULARY MEDICATION BD U/P Mini Pen  [place] 31GX5MM Use three times daily as directed DX:250.00 08/07/13  Yes Blanchie Serve, MD  AMBULATORY  NON FORMULARY MEDICATION One Touch Ultra 2 Test Strips Sig:  Check blood sugars Three times daily to keep blood sugars under control Dx: 250.00, 401.9 11/26/13  Yes Mahima Pandey, MD  amLODipine (NORVASC) 10 MG tablet take 1 tablet by mouth once daily 12/28/13  Yes Mahima Pandey, MD  aspirin EC 81 MG tablet Take 1 tablet (81 mg total) by mouth every morning. 08/21/12  Yes Daniel J Angiulli, PA-C  clopidogrel (PLAVIX) 75 MG tablet take 1 tablet by mouth once daily 02/11/14  Yes Blanchie Serve, MD  doxazosin (CARDURA) 1 MG tablet take 1 tablet by mouth at bedtime 02/11/14  Yes Mahima Pandey, MD  hydrALAZINE (APRESOLINE) 50 MG tablet Take 1 tablet (50 mg total) by mouth 2 (two) times daily. 01/01/14  Yes Mahima Bubba Camp, MD  HYDROcodone-acetaminophen (NORCO/VICODIN) 5-325 MG per tablet Take 1 tablet by mouth every 12 (twelve) hours as needed for moderate pain. 04/02/14  Yes Mahima Pandey, MD  insulin aspart (NOVOLOG) 100 UNIT/ML FlexPen Inject 5 Units into the skin 3 (three) times daily with meals. Inject 5 units three times a day with meals if blood sugar greater than 150 only, DX: 250.71 08/07/13  Yes Mahima Pandey, MD  Insulin Detemir (LEVEMIR) 100 UNIT/ML Pen Inject 22 Units into the skin at bedtime. 01/28/14  Yes Tiffany L Reed, DO  LYRICA 75 MG capsule take 1 capsule by mouth twice a day 02/11/14  Yes Blanchie Serve, MD  metoprolol succinate (TOPROL-XL) 25 MG 24 hr tablet Take 1 tablet (25 mg total) by mouth daily. 08/07/13  Yes Blanchie Serve, MD  Misc. Devices [facility] [place] Patient needs new Wheelchair due to his being broken. 01/09/14  Yes Mahima Pandey, MD  pantoprazole (PROTONIX) 20 MG tablet Take 1 tablet (20 mg total) by mouth daily. 08/07/13  Yes Mahima Pandey, MD  pravastatin (PRAVACHOL) 20 MG tablet Take 1 tablet (20 mg total) by mouth daily. 08/07/13  Yes Mahima Pandey, MD  sodium polystyrene  (KAYEXALATE) 15 GM/60ML suspension Take 60 mLs (15 g total) by mouth 2 (two) times daily. One day 04/04/14  Yes Blanchie Serve, MD    Allergies  Allergen Reactions  . Oxycodone Other (See Comments)    Hallucinations    Physical Exam  Vitals  Blood pressure 182/71, pulse 70, temperature 98.2 F (36.8 C), temperature source Oral, resp. rate 19, height 5\' 8"  (1.727 m), weight 91.717 kg (202 lb 3.2 oz), SpO2 98 %.   General: Obese pleasant, male in his legs and lying in bed in NAD,   Psych:  Normal affect and insight, Not Suicidal or Homicidal, Awake Alert, Oriented X 3.  Neuro:   No F.N deficits, ALL C.Nerves Intact, Strength 5/5 all 4 extremities, Sensation intact all 4 extremities.  ENT:  Ears and Eyes appear Normal, Conjunctivae clear, PER. Moist oral mucosa without erythema or exudates.  Neck:  Supple, No lymphadenopathy appreciated  Respiratory:  Symmetrical chest wall movement, Good air movement bilaterally, CTAB.  Cardiac:  RRR, No Murmurs, no LE edema noted, no JVD.    Abdomen:  Obese, Positive bowel sounds, Soft, Non tender, Non distended,  No masses appreciated  Skin:  No Cyanosis, Normal Skin Turgor, No Skin Rash or Bruise.  Extremities:  Bilateral BKA, 5/5 strength in upper extremities.  Data Review  CBC  Recent Labs Lab 04/02/14 1540 04/08/14 1052  WBC 6.2 5.7  HGB 11.0* 10.7*  HCT 36.0* 33.7*  PLT 211 177  MCV 93 91.1  MCH 28.3 28.9  MCHC 30.6* 31.8  RDW 14.9 14.4  LYMPHSABS 2.0  --   EOSABS 0.2  --   BASOSABS 0.1  --     Chemistries   Recent Labs Lab 04/02/14 1540 04/08/14 1052  NA 144 141  K 6.4* 5.1  CL 109* 109  CO2 21 23  GLUCOSE 61* 153*  BUN 45* 39*  CREATININE 4.49* 4.69*  CALCIUM 9.1 9.1  AST 14  --   ALT 6  --   ALKPHOS 121*  --   BILITOT <0.2  --       Imaging results:   Dg Chest 2 View  04/08/2014   CLINICAL DATA:  Midsternal chest pain since 4 a.m. Chest tightness. History diabetes.  EXAM: CHEST  2 VIEW   COMPARISON:  11/07/2013  FINDINGS: There is mild left basilar atelectasis. There is no focal parenchymal opacity, pleural effusion, or pneumothorax. The heart and mediastinal contours are unremarkable. There is evidence of prior CABG. There is thoracic aortic atherosclerosis.  The osseous structures are unremarkable.  IMPRESSION: No active cardiopulmonary disease.   Electronically Signed   By: Kathreen Devoid   On: 04/08/2014 10:35    My personal review of EKG: NSR, No ST changes noted.   Assessment & Plan  Principal Problem:   Chest pain Active Problems:   S/P BKA (below knee amputation)   Hyperkalemia   Diabetes mellitus with peripheral vascular disease   GERD (gastroesophageal reflux disease)   Drug-induced constipation   Acute on chronic renal failure   Hypertensive urgency  Chest pain Patient reports chest tightness has resolved.  It was likely due to anxiety and uncontrolled hypertension. We'll admit him for chest pain rule out. Cycle his troponins, check a 2-D echo. In improved blood pressure control.  Heart score is 5.  Hypertensive urgency We will restart the patient's home medications. I do not believe he was taking metoprolol prior to today due to confusion about his  physician's instructions. We will increase his dose of hydralazine to 100 mg 3 times a day and order hydralazine IV when necessary. Continue amlodipine and toprol at prior dosage.  Acute on chronic renal failure stage V  Baseline creatinine on March 22 was 4.49. Proximally 6 months ago in September 2015 it was 3.72. I spoke with Dr. Jamal Maes who felt that this was likely progression of the patient's disease, worsened by noncompliance, diabetes and elevated blood pressure. She recommends that he receive a nutrition consultation during this admission in an effort to avoid episodes of hyperkalemia and that he call the office for a follow-up appointment.   Diabetes mellitus  Continue the patient's home dose of  Levemir and NovoLog. Carb modified diet   Constipation  Patient reports one bowel movement every 2 weeks  Placed on multiple stool softeners.    DVT Prophylaxis: heparin  AM Labs Ordered, also please review Full Orders  Family Communication:   Wife and dtr present  Code Status:  full  Condition:  stable  Time spent in minutes : [address]  PA-C on 04/08/2014 at 5:43 PM  Between 7am to 7pm - Pager - [phone]  After 7pm go to [email or web address] - password [email or web address]  And look for the night coverage person covering me after hours  [facility]

## 2014-04-09 ENCOUNTER — Encounter (HOSPITAL_COMMUNITY): Payer: Self-pay | Admitting: Physician Assistant

## 2014-04-09 DIAGNOSIS — Z89511 Acquired absence of right leg below knee: Secondary | ICD-10-CM

## 2014-04-09 DIAGNOSIS — Z89512 Acquired absence of left leg below knee: Secondary | ICD-10-CM

## 2014-04-09 DIAGNOSIS — N179 Acute kidney failure, unspecified: Secondary | ICD-10-CM | POA: Diagnosis not present

## 2014-04-09 DIAGNOSIS — R0789 Other chest pain: Secondary | ICD-10-CM | POA: Diagnosis not present

## 2014-04-09 DIAGNOSIS — I10 Essential (primary) hypertension: Secondary | ICD-10-CM | POA: Diagnosis not present

## 2014-04-09 DIAGNOSIS — N189 Chronic kidney disease, unspecified: Secondary | ICD-10-CM | POA: Diagnosis not present

## 2014-04-09 DIAGNOSIS — E1151 Type 2 diabetes mellitus with diabetic peripheral angiopathy without gangrene: Secondary | ICD-10-CM | POA: Diagnosis not present

## 2014-04-09 DIAGNOSIS — R079 Chest pain, unspecified: Secondary | ICD-10-CM | POA: Diagnosis not present

## 2014-04-09 LAB — BASIC METABOLIC PANEL
Anion gap: 6 (ref 5–15)
BUN: 42 mg/dL — ABNORMAL HIGH (ref 6–23)
CHLORIDE: 107 mmol/L (ref 96–112)
CO2: 27 mmol/L (ref 19–32)
Calcium: 8.8 mg/dL (ref 8.4–10.5)
Creatinine, Ser: 4.98 mg/dL — ABNORMAL HIGH (ref 0.50–1.35)
GFR calc non Af Amer: 10 mL/min — ABNORMAL LOW (ref >90)
GFR, EST AFRICAN AMERICAN: 12 mL/min — AB (ref >90)
Glucose, Bld: 169 mg/dL — ABNORMAL HIGH (ref 70–99)
Potassium: 4.5 mmol/L (ref 3.5–5.1)
Sodium: 140 mmol/L (ref 135–145)

## 2014-04-09 LAB — CBC
HCT: 33.8 % — ABNORMAL LOW (ref 39.0–52.0)
Hemoglobin: 10.5 g/dL — ABNORMAL LOW (ref 13.0–17.0)
MCH: 28.8 pg (ref 26.0–34.0)
MCHC: 31.1 g/dL (ref 30.0–36.0)
MCV: 92.9 fL (ref 78.0–100.0)
Platelets: 185 10*3/uL (ref 150–400)
RBC: 3.64 MIL/uL — ABNORMAL LOW (ref 4.22–5.81)
RDW: 14.6 % (ref 11.5–15.5)
WBC: 5.8 10*3/uL (ref 4.0–10.5)

## 2014-04-09 LAB — GLUCOSE, CAPILLARY
Glucose-Capillary: 181 mg/dL — ABNORMAL HIGH (ref 70–99)
Glucose-Capillary: 115 mg/dL — ABNORMAL HIGH (ref 70–99)
Glucose-Capillary: 196 mg/dL — ABNORMAL HIGH (ref 70–99)

## 2014-04-09 LAB — MRSA PCR SCREENING: MRSA BY PCR: NEGATIVE

## 2014-04-09 MED ORDER — HYDRALAZINE HCL 100 MG PO TABS: 100.0000 mg | ORAL_TABLET | Freq: Three times a day (TID) | ORAL | Status: DC

## 2014-04-09 MED ORDER — PREGABALIN 75 MG PO CAPS: 75.0000 mg | ORAL_CAPSULE | Freq: Every day | ORAL | Status: DC

## 2014-04-09 MED ORDER — DOCUSATE SODIUM 100 MG PO CAPS: 100.0000 mg | ORAL_CAPSULE | Freq: Two times a day (BID) | ORAL | Status: DC

## 2014-04-09 NOTE — Consult Note (Signed)
Cardiology Consultation Note  Patient ID: Aaron Long, MRN: [id number], DOB/AGE: [birth year] 74 y.o. Admit date: 04/08/2014   Date of Consult: 04/09/2014 Primary Physician: [name], NP Primary Cardiologist: Angelena Form in 2013  Chief Complaint: nervous Reason for Consultation: initially reported chest pain  HPI: Mr. Mcdowell is a 74 y/o M with history of CAD/NSTEMI 2007 s/p 4V CABG (LIMA-LAD, SVG-optional diagonal, SVVG-OM, SVG-dRCA), ICM EF 40% in 2007, CKD stage IV, DM, HTN, HLD, right carotid endarterectomy 2007, PAD (with history of gangrene s/p bilateral BKAs), anemia, obesity, prostate CA, DVT 2013 who presented to [facility] yesterday with complaints of chest pain. Last nuc stress test on 06/03/11 showed:There is no evidence of scar or ischemia. There is septal dyssynergy compatible with the history of CABG, EF 51%.   On Thursday 04/04/14 the patient was told by his PCP that he had a K of 6.0 and this could cause a heart attack. He says he was instructed to go to the ER but did not. Kayexalate was called in and he took this on Friday. Over the weekend he became nervous that he might have a heart attack as the PCP had suggested. Due to "wanting to be checked out," he called 911. He says he reported chest tightness to the EMS worker even though he did not have any because it would "get him to the hospital." He denies any recent chest pain/tightness, SOB, nausea, vomiting, diaphoresis, syncope, palpitations. Goes to PT to learn to walk with prostheses and has not had any recent chest pain with exertion. EKG nonacute. Labs notable for chronic anemia Hgb 10.5, AKI on CKD (4.69 with K 5.1, previous Cr 4.49 on 3/22, 3.7 in 2015), troponins 0.04 then 0.3 then 0.04. CXR no acute disease. Initial blood pressure 210/98. He reports med compliance but IM's note raised question of poor compliance with diabetic and hypertensive medications.   2D echo today showed mild LVH, EF 60-65%, grade 1  dd, mild MR, LA moderately dilated. Tele NSR.  Past Medical History  Diagnosis Date  . Prostate cancer 2010  . PAD (peripheral artery disease)     a. Hx L BKA in 2013. b. s/p LE angioplasty in 2014. c. Hx R BKA in 2014.  . Gangrene of toe     dry  . Neuromuscular disorder     diabetic neuropathy  . Hypertension   . Hyperlipidemia   . Myocardial infarction 2005  . Peripheral neuropathy   . Constipation     takes Miralax daily  . Hemorrhoids   . Hx of colonic polyps   . History of kidney stones   . Diabetes mellitus   . Coronary artery disease     a. s/p NSTEMI/CABGx4 in 2007 - LIMA-LAD, SVG-optional diagonal, SVVG-OM, SVG-dRCA. b. Nuc 05/2011: nonischemic.  [name] BPH (benign prostatic hypertrophy)   . Abnormality of gait   . DVT (deep venous thrombosis)     a. Lower extremity DVT in 2013.  [name] Hypertrophy of prostate without urinary obstruction and other lower urinary tract symptoms (LUTS)   . Disorder of bone and cartilage, unspecified   . Lower limb amputation, below knee   . Anemia   . Secondary hyperparathyroidism     Secondary Hyperpara- Thyroidism, Renal  . CKD (chronic kidney disease), stage IV   . Ischemic cardiomyopathy     a. EF 40% in 2007. b. 51% by nuc 05/2011.      Most Recent Cardiac Studies: Nuclear stress test on 06/03/11 showed:There  is no evidence of scar or ischemia. There is septal dyssynergy compatible with the history of CABG. LV Ejection Fraction: 51%. LV Wall Motion: There is dyssynergy of the septum compatible with history of CABG.  2D Echo 2007 SUMMARY - Mild mid septal DUST. Left ventricular ejection fraction was    estimated to be 55 %. There were no left ventricular regional    wall motion abnormalities. There was mild asymmetric septal    hypertrophy. There was no systolic anterior motion of the    mitral valve. There was an increased relative contribution of    atrial contraction to left ventricular filling. - The aortic  valve was mildly calcified. - No echocardiographic evidence for mitral valve prolapse. There    was mild mitral valvular regurgitation. - Mid DUST(dispropotionated upper septal thickening).Diastolic    relaxation abnormality.Normal systolic function. No    significant valvular abnormality. IMPRESSIONS - Mid DUST(dispropotionated upper septal thickening).Diastolic    relaxation abnormality.Normal systolic function. No    significant valvular abnormality.  2D Echo 04/09/2014 - Left ventricle: The cavity size was normal. Wall thickness was increased in a pattern of mild LVH. There was moderate focal basal hypertrophy of the septum. Systolic function was normal. The estimated ejection fraction was in the range of 60% to 65%. Wall motion was normal; there were no regional wall motion abnormalities. Doppler parameters are consistent with abnormal left ventricular relaxation (grade 1 diastolic dysfunction). The E/e&' ratio is between 8-15, suggesting indeterminate LV fililng pressure. - Aortic valve: Sclerosis without stenosis. There was no regurgitation. - Mitral valve: Mildly thickened leaflets . There was mild regurgitation. - Left atrium: The atrium was moderately dilated at 41 ml/m2. - Inferior vena cava: The vessel was normal in size. The respirophasic diameter changes were in the normal range (>= 50%), consistent with normal central venous pressure. Impressions: - LVEF 60-65%, mild LVH, moderate basal septal hypertrophy, normal wall motion, diastolic dysfunction, indeterminate LV filling pressure, mild MR, moderately dilated LA, normal IVC.   Surgical History:  Past Surgical History  Procedure Laterality Date  . Prostate surgery  2009    prostatectomy  . Knee cartilage surgery  at age 74    left knee  . Cea      Right  . Coronary artery bypass graft  2005    x 4  . Carotid endarterectomy  2005    right  . Cataract  surgery  2011    bilateral  . Colonoscopy    . Cardiac catheterization  05/28/11  . Amputation  06/14/2011    Procedure: AMPUTATION DIGIT;  Surgeon: Elam Dutch, MD;  Location: [facility] OR;  Service: Vascular;  Laterality: Left;  Amputation Left fifth toe  . Amputation  06/16/2011    Procedure: AMPUTATION BELOW KNEE;  Surgeon: Elam Dutch, MD;  Location: [place];  Service: Vascular;  Laterality: Left;  . I&d extremity  01/31/2012    Procedure: IRRIGATION AND DEBRIDEMENT EXTREMITY;  Surgeon: Elam Dutch, MD;  Location: [place];  Service: Vascular;  Laterality: Left;  I & D Left BKA   . Amputation Right 06/12/2012    Procedure: AMPUTATION DIGIT;  Surgeon: Elam Dutch, MD;  Location: [facility] OR;  Service: Vascular;  Laterality: Right;  GREAT TOE  . Below knee leg amputation Right 08/08/2012    Dr Oneida Alar  . Amputation Right 08/08/2012    Procedure: AMPUTATION BELOW KNEE;  Surgeon: Elam Dutch, MD;  Location: [place];  Service: Vascular;  Laterality: Right;  .  Abdominal aortagram N/A 05/28/2011    Procedure: ABDOMINAL Maxcine Ham;  Surgeon: Elam Dutch, MD;  Location: [facility] CATH LAB;  Service: Cardiovascular;  Laterality: N/A;  . Abdominal aortagram N/A 06/02/2012    Procedure: ABDOMINAL Maxcine Ham;  Surgeon: Elam Dutch, MD;  Location: [facility] CATH LAB;  Service: Cardiovascular;  Laterality: N/A;  . Abdominal aortagram N/A 06/09/2012    Procedure: ABDOMINAL Maxcine Ham;  Surgeon: Elam Dutch, MD;  Location: [facility] CATH LAB;  Service: Cardiovascular;  Laterality: N/A;     Home Meds: Prior to Admission medications   Medication Sig Start Date End Date Taking? Authorizing Provider  AMBULATORY NON FORMULARY MEDICATION BD U/P Mini Pen [place] 31GX5MM Use three times daily as directed DX:250.00 08/07/13  Yes Mahima Pandey, MD  AMBULATORY NON FORMULARY MEDICATION One Touch Ultra 2 Test Strips Sig:  Check blood sugars Three times daily to keep blood sugars under control Dx: 250.00, 401.9 11/26/13  Yes  Mahima Pandey, MD  amLODipine (NORVASC) 10 MG tablet take 1 tablet by mouth once daily 12/28/13  Yes Mahima Pandey, MD  aspirin EC 81 MG tablet Take 1 tablet (81 mg total) by mouth every morning. 08/21/12  Yes Daniel J Angiulli, PA-C  clopidogrel (PLAVIX) 75 MG tablet take 1 tablet by mouth once daily 02/11/14  Yes Blanchie Serve, MD  doxazosin (CARDURA) 1 MG tablet take 1 tablet by mouth at bedtime 02/11/14  Yes Mahima Pandey, MD  hydrALAZINE (APRESOLINE) 50 MG tablet Take 1 tablet (50 mg total) by mouth 2 (two) times daily. 01/01/14  Yes Mahima Bubba Camp, MD  HYDROcodone-acetaminophen (NORCO/VICODIN) 5-325 MG per tablet Take 1 tablet by mouth every 12 (twelve) hours as needed for moderate pain. 04/02/14  Yes Mahima Pandey, MD  insulin aspart (NOVOLOG) 100 UNIT/ML FlexPen Inject 5 Units into the skin 3 (three) times daily with meals. Inject 5 units three times a day with meals if blood sugar greater than 150 only, DX: 250.71 08/07/13  Yes Mahima Pandey, MD  Insulin Detemir (LEVEMIR) 100 UNIT/ML Pen Inject 22 Units into the skin at bedtime. 01/28/14  Yes Tiffany L Reed, DO  LYRICA 75 MG capsule take 1 capsule by mouth twice a day 02/11/14  Yes Blanchie Serve, MD  metoprolol succinate (TOPROL-XL) 25 MG 24 hr tablet Take 1 tablet (25 mg total) by mouth daily. 08/07/13  Yes Blanchie Serve, MD  Misc. Devices [facility]) [place] Patient needs new Wheelchair due to his being broken. 01/09/14  Yes Mahima Pandey, MD  pantoprazole (PROTONIX) 20 MG tablet Take 1 tablet (20 mg total) by mouth daily. 08/07/13  Yes Mahima Pandey, MD  pravastatin (PRAVACHOL) 20 MG tablet Take 1 tablet (20 mg total) by mouth daily. 08/07/13  Yes Mahima Pandey, MD  sodium polystyrene (KAYEXALATE) 15 GM/60ML suspension Take 60 mLs (15 g total) by mouth 2 (two) times daily. One day 04/04/14  Yes Mahima Bubba Camp, MD  docusate sodium (COLACE) 100 MG capsule Take 1 capsule (100 mg total) by mouth 2 (two) times daily. 04/09/14   Cristal Ford, DO    Inpatient  Medications:  . amLODipine  10 mg Oral Daily  . aspirin EC  81 mg Oral q morning - 10a  . clopidogrel  75 mg Oral Daily  . docusate sodium  100 mg Oral BID  . doxazosin  1 mg Oral QHS  . heparin  5,000 Units Subcutaneous 3 times per day  . hydrALAZINE  100 mg Oral 3 times per day  . insulin aspart  0-20 Units Subcutaneous TID  WC  . insulin detemir  20 Units Subcutaneous QHS  . metoprolol succinate  25 mg Oral Daily  . pantoprazole  20 mg Oral Daily  . polyethylene glycol  17 g Oral BID  . pravastatin  20 mg Oral Daily  . [START ON 04/10/2014] pregabalin  75 mg Oral Daily      Allergies:  Allergies  Allergen Reactions  . Oxycodone Other (See Comments)    Hallucinations    History   Social History  . Marital Status: Married    Spouse Name: N/A  . Number of Children: 3  . Years of Education: N/A   Occupational History  . Retired-[employer]    Social History Main Topics  . Smoking status: Former Smoker -- 1.00 packs/day for 40 years    Types: Cigarettes    Quit date: 04/28/1991  . Smokeless tobacco: Never Used     Comment: occ alcohol  . Alcohol Use: No  . Drug Use: No  . Sexual Activity: Not Currently   Other Topics Concern  . Not on file   Social History Narrative     Family History  Problem Relation Age of Onset  . Coronary artery disease Neg Hx   . Anesthesia problems Neg Hx   . Hypotension Neg Hx   . Malignant hyperthermia Neg Hx   . Pseudochol deficiency Neg Hx   . Hyperlipidemia Mother   . Hypertension Mother   . Cancer Mother   . Hyperlipidemia Father   . Hypertension Father   . Kidney disease Father   . Heart disease Sister   . Alzheimer's disease Sister   . Cancer Brother   . Other Sister      Review of Systems All other systems reviewed and are otherwise negative except as noted above.  Labs:  Recent Labs  04/08/14 1651 04/08/14 1810 04/08/14 2030  TROPONINI 0.04* 0.03 0.04*   Lab Results  Component Value Date   WBC  5.8 04/09/2014   HGB 10.5* 04/09/2014   HCT 33.8* 04/09/2014   MCV 92.9 04/09/2014   PLT 185 04/09/2014    Recent Labs Lab 04/02/14 1540  04/09/14 0734  NA 144  < > 140  K 6.4*  < > 4.5  CL 109*  < > 107  CO2 21  < > 27  BUN 45*  < > 42*  CREATININE 4.49*  < > 4.98*  CALCIUM 9.1  < > 8.8  PROT 6.4  --   --   BILITOT <0.2  --   --   ALKPHOS 121*  --   --   ALT 6  --   --   AST 14  --   --   GLUCOSE 61*  < > 169*  < > = values in this interval not displayed. Lab Results  Component Value Date   CHOL 147 04/02/2014   HDL 36* 04/02/2014   LDLCALC 97 04/02/2014   TRIG 68 04/02/2014   No results found for: DDIMER  Radiology/Studies:  Dg Chest 2 View  04/08/2014   CLINICAL DATA:  Midsternal chest pain since 4 a.m. Chest tightness. History diabetes.  EXAM: CHEST  2 VIEW  COMPARISON:  11/07/2013  FINDINGS: There is mild left basilar atelectasis. There is no focal parenchymal opacity, pleural effusion, or pneumothorax. The heart and mediastinal contours are unremarkable. There is evidence of prior CABG. There is thoracic aortic atherosclerosis.  The osseous structures are unremarkable.  IMPRESSION: No active cardiopulmonary disease.   Electronically  Signed   By: Kathreen Devoid   On: 04/08/2014 10:35    Wt Readings from Last 3 Encounters:  04/08/14 202 lb 3.2 oz (91.717 kg)  04/02/14 210 lb (95.255 kg)  04/23/13 210 lb (95.255 kg)   EKG: NSR 78bpm, old anteroseptal infarct, nonspecific TW flattening I, avL, V6  Physical Exam: Blood pressure 166/71, pulse 74, temperature 98.5 F (36.9 C), temperature source Oral, resp. rate 18, height 5\' 8"  (1.727 m), weight 202 lb 3.2 oz (91.717 kg), SpO2 95 %. General: Well developed, well nourished M in no acute distress. Head: Normocephalic, atraumatic, sclera non-icteric, no xanthomas, nares are without discharge.  Neck: R carotid bruit. JVD not elevated. Lungs: Clear bilaterally to auscultation without wheezes, rales, or rhonchi. Breathing  is unlabored. Heart: RRR with S1 S2. 1/6 sem, rubs, or gallops appreciated. Abdomen: Soft, non-tender, non-distended with normoactive bowel sounds. No hepatomegaly. No rebound/guarding. No obvious abdominal masses. Msk:  Strength and tone appear normal for age. Extremities: No clubbing or cyanosis. S/p bilateral BKA.   Neuro: Alert and oriented X 3. No facial asymmetry. No focal deficit. Moves all extremities spontaneously. Psych:  Responds to questions appropriately with a normal affect.    Assessment and Plan:   1. AKI on CKD with recent hyperkalemia  2. CAD s/p CABG 2007 with minimal troponin bump of no clinical significance in absence of chest pain and presence of Cr >4 and hypertension 3. Hypertensive urgency 4. Diabetes mellitus 5. History of LV Dysfunction EF 40% in 2007, improved to 60-65% by recent echo  Although the patient initially reported chest pain he now vehemently denies this symptom, citing that he only said he had it to facilitate getting to the hospital to ease his nerves about the high potassium level. In light of advanced renal dysfunction and no recent anginal symptoms, would recommend to continue to treat medically and observe for any recurrent symptoms. Continue aspirin, BB, statin, amlodipine. Presumably he is on Plavix due to history of PAD but I cannot confirm this. Further management of BP in setting of AKI per IM - he had questionable compliance with metoprolol which has been continued, and hydralazine has been increased. May be reaching the point where blood pressure management will be incredibly difficult without proceeding to dialysis. 2D echo just resulted and confirms normal EF with no RWMA.  He has not followed up as an outpatient. Have arranged f/u with Richardson Dopp PA-C in our office 4/21 at 9:50am.  Dr. Claiborne Billings to follow but do not anticipate further cardiac procedures needed at this time.  Signed, Melina Copa PA-C 04/09/2014, 2:36 PM   Patient seen and  examined. Agree with assessment and plan. Very pleasant 74 yo AAM PVD and CAD,s/p Bilateral BKA's, RCEA who has developed worsening renal function. He is followed by Dr. Joelyn Oms and initially has refused AV fistula placement for future need for dialysis. He denies recent chest pain, and has not seen a cardiologist in 3 years. He seesDr. Oneida Alar for vascular surgery. He had developed recent hyperkalemia which has resolved. ECG on admission is without ischemic or hyperkalemic changes. Echo done today shows improvement in LV function. Cr today is 4.98. I have recommended that he have close f/u with nephrology and will need access for fututre dialysis.  OK from cardiac standpoint for dc today, but needs to reconnect with cardiology f/u evaluation as outpatient.   Troy Sine, MD, [facility] 04/09/2014 3:45 PM

## 2014-04-09 NOTE — Progress Notes (Signed)
UR completed 

## 2014-04-09 NOTE — Discharge Summary (Signed)
Physician Discharge Summary  Aaron Long ACZ:[id number] DOB: 1940-11-07 DOA: 04/08/2014  PCP: Aaron Chandler, NP  Admit date: 04/08/2014 Discharge date: 04/09/2014  Time spent: 35 minutes  Recommendations for Outpatient Follow-up:  Patient will be discharged home.  He will need to follow up his primary care physician within one week of discharge. Patient to continue taking his medications as prescribed. Patient may resume activity as tolerated. Patient is to also follow-up with Aaron Long, nephrologist. Appointment for cardiology follow up has been made for 05/02/2014. Patient should follow a heart healthy/carb modified diet.  Discharge Diagnoses:  Atypical chest pain Accelerated hypertension Acute on chronic kidney disease, stage V Diabetes mellitus, type II Constipation  Discharge Condition: Stable  Diet recommendation: heart healthy/carb modified diet  Filed Weights   04/08/14 1533  Weight: 91.717 kg (202 lb 3.2 oz)    History of present illness:  On 04/08/2014 by Aaron Long, [place] and Aaron Long Aaron Long is a 74 y.o. male, with a past medical history significant for chronic kidney disease stage IV, bilateral BKA's, type 2 diabetes, coronary artery disease, hypertension, and hyperlipidemia who presented to the emergency department today for chest tightness. The patient reports he was told by his primary care physician that he had a potassium of 6.0 and that this could potentially cause a heart attack. He was prescribed Kayexalate which he took and it worked effectively. The patient believes that he was so anxious about potentially having a heart attack that he caused himself to have chest tightness. He states that he woke at 4 AM this morning with some chest tightness that was relieved by EMS giving him 3 sublingual nitroglycerin. At this point he denies any chest pain. In the ER he is noted to have a blood pressure of 210/98, troponin is normal, EKG is  negative, chest x-ray is negative. Blood work shows slowly worsening renal function and his creatinine is elevated from 3.72 (on 9/15-4.69 today. We will admit him to a telemetry bed for chest pain rule out.  Hospital Course:  Atypical Chest Pain/Mildly elevated troponin -Currently chest pain free -Troponin likely from CKD -Echocardiogram: EF 10-93%, grade 1 diastolic dysfunction -Cardiology consulted and appreciated -Lipid panel: TC 147, TG 68, HDL 36, LDL 97  -HbA1c 7.2  Accelerated Hypertension -resolved -Continue home medications  Acute on chronic kidney disease, Stage V -Admitting provider spoke with nephrology, Aaron Long, who felt that this was likely progression of the patient's disease, worsened by noncompliance, diabetes and elevated blood pressure. She recommends that he receive a nutrition consultation during this admission in an effort to avoid episodes of hyperkalemia and that he call the office for a follow-up appointment.  -Nutrition consulted  Diabetes Mellitus, Type II -continue home regimen -HbA1c 7.2  Constipation  -Continue stool softeners   Normocytic Anemia -HB appears to be stable  Procedures: Echocardiogram: LVEF 60-65%, mild LVH, moderate basal septal hypertrophy, normal wall motion, diastolic dysfunction, indeterminate LV filling pressure, mild MR, moderately dilated LA, normal IVC.  Consultations: Cardiology  Discharge Exam: Filed Vitals:   04/09/14 1354  BP: 166/71  Pulse: 74  Temp: 98.5 F (36.9 C)  Resp: 18     General: Well developed, well nourished, NAD  HEENT: NCAT, mucous membranes moist.  Cardiovascular: S1 S2 auscultated, no rubs, murmurs or gallops. RRR  Respiratory: Clear to auscultation bilaterally with equal chest rise  Abdomen: Soft, nontender, nondistended, + bowel sounds  Extremities: B/L LE BKA  Neuro: AAOx3, nonfocal  Psych: Normal  affect and demeanor with intact judgement and insight  Discharge  Instructions      Discharge Instructions    Discharge instructions    Complete by:  As directed   Patient will be discharged home.  He will need to follow up his primary care physician within one week of discharge. Patient to continue taking his medications as prescribed. Patient may resume activity as tolerated. Patient is to also follow-up with Aaron Long, nephrologist. Appointment for cardiology follow up has been made for 05/02/2014. Patient should follow a heart healthy/carb modified diet.            Medication List    TAKE these medications        AMBULATORY NON FORMULARY MEDICATION  - BD U/P Mini Pen [place] 31GX5MM  - Use three times daily as directed  - DX:250.00     AMBULATORY NON FORMULARY MEDICATION  - One Touch Ultra 2 Test Strips  - Sig:  Check blood sugars Three times daily to keep blood sugars under control  - Dx: 250.00, 401.9     amLODipine 10 MG tablet  Commonly known as:  NORVASC  take 1 tablet by mouth once daily     aspirin EC 81 MG tablet  Take 1 tablet (81 mg total) by mouth every morning.     clopidogrel 75 MG tablet  Commonly known as:  PLAVIX  take 1 tablet by mouth once daily     docusate sodium 100 MG capsule  Commonly known as:  COLACE  Take 1 capsule (100 mg total) by mouth 2 (two) times daily.     doxazosin 1 MG tablet  Commonly known as:  CARDURA  take 1 tablet by mouth at bedtime     hydrALAZINE 100 MG tablet  Commonly known as:  APRESOLINE  Take 1 tablet (100 mg total) by mouth every 8 (eight) hours.     HYDROcodone-acetaminophen 5-325 MG per tablet  Commonly known as:  NORCO/VICODIN  Take 1 tablet by mouth every 12 (twelve) hours as needed for moderate pain.     insulin aspart 100 UNIT/ML FlexPen  Commonly known as:  NOVOLOG  Inject 5 Units into the skin 3 (three) times daily with meals. Inject 5 units three times a day with meals if blood sugar greater than 150 only, DX: 250.71     Insulin Detemir 100 UNIT/ML Pen   Commonly known as:  LEVEMIR  Inject 22 Units into the skin at bedtime.     metoprolol succinate 25 MG 24 hr tablet  Commonly known as:  TOPROL-XL  Take 1 tablet (25 mg total) by mouth daily.     pantoprazole 20 MG tablet  Commonly known as:  PROTONIX  Take 1 tablet (20 mg total) by mouth daily.     pravastatin 20 MG tablet  Commonly known as:  PRAVACHOL  Take 1 tablet (20 mg total) by mouth daily.     pregabalin 75 MG capsule  Commonly known as:  LYRICA  Take 1 capsule (75 mg total) by mouth daily.  Start taking on:  04/10/2014     sodium polystyrene 15 GM/60ML suspension  Commonly known as:  KAYEXALATE  Take 60 mLs (15 g total) by mouth 2 (two) times daily. One day     Wheelchair Misc  Patient needs new Wheelchair due to his being broken.       Allergies  Allergen Reactions  . Oxycodone Other (See Comments)    Hallucinations   Follow-up Information  Follow up with Dewaine Oats, JESSICA K, NP. Schedule an appointment as soon as possible for a visit in 1 week.   Specialty:  Nurse Practitioner   Why:  Hospital Follow up   Contact information:   [place]. [place] [address] [phone]       Follow up with Rexene Agent, MD. Schedule an appointment as soon as possible for a visit in 1 week.   Specialty:  Nephrology   Why:  Hospital follow up, chronic kidney disease   Contact information:   [place] [address] [phone]       Follow up with Richardson Dopp, PA-C.   Specialty:  Physician Assistant   Why:  [facility] - 05/02/14 at 9:50am   Contact information:   [address] [place] [address] [phone]        The results of significant diagnostics from this hospitalization (including imaging, microbiology, ancillary and laboratory) are listed below for reference.    Significant Diagnostic Studies: Dg Chest 2 View  04/08/2014   CLINICAL DATA:  Midsternal chest pain since 4 a.m. Chest tightness.  History diabetes.  EXAM: CHEST  2 VIEW  COMPARISON:  11/07/2013  FINDINGS: There is mild left basilar atelectasis. There is no focal parenchymal opacity, pleural effusion, or pneumothorax. The heart and mediastinal contours are unremarkable. There is evidence of prior CABG. There is thoracic aortic atherosclerosis.  The osseous structures are unremarkable.  IMPRESSION: No active cardiopulmonary disease.   Electronically Signed   By: Kathreen Devoid   On: 04/08/2014 10:35    Microbiology: Recent Results (from the past 240 hour(s))  MRSA PCR Screening     Status: None   Collection Time: 04/09/14  3:06 AM  Result Value Ref Range Status   MRSA by PCR NEGATIVE NEGATIVE Final    Comment:        The GeneXpert MRSA Assay (FDA approved for NASAL specimens only), is one component of a comprehensive MRSA colonization surveillance program. It is not intended to diagnose MRSA infection nor to guide or monitor treatment for MRSA infections.      Labs: Basic Metabolic Panel:  Recent Labs Lab 04/08/14 1052 04/08/14 1651 04/09/14 0734  NA 141  --  140  K 5.1  --  4.5  CL 109  --  107  CO2 23  --  27  GLUCOSE 153*  --  169*  BUN 39*  --  42*  CREATININE 4.69* 4.81* 4.98*  CALCIUM 9.1  --  8.8   Liver Function Tests: No results for input(s): AST, ALT, ALKPHOS, BILITOT, PROT, ALBUMIN in the last 168 hours. No results for input(s): LIPASE, AMYLASE in the last 168 hours. No results for input(s): AMMONIA in the last 168 hours. CBC:  Recent Labs Lab 04/08/14 1052 04/09/14 0734  WBC 5.7 5.8  HGB 10.7* 10.5*  HCT 33.7* 33.8*  MCV 91.1 92.9  PLT 177 185   Cardiac Enzymes:  Recent Labs Lab 04/08/14 1651 04/08/14 1810 04/08/14 2030  TROPONINI 0.04* 0.03 0.04*   BNP: BNP (last 3 results) No results for input(s): BNP in the last 8760 hours.  ProBNP (last 3 results) No results for input(s): PROBNP in the last 8760 hours.  CBG:  Recent Labs Lab 04/08/14 1641 04/08/14 2109  04/09/14 0645 04/09/14 1113  GLUCAP 168* 100* 181* 115*       Signed:  Cristal Ford  [facility] 04/09/2014, 3:57 PM

## 2014-04-09 NOTE — Discharge Instructions (Signed)

## 2014-04-09 NOTE — Plan of Care (Signed)
Problem: Food- and Nutrition-Related Knowledge Deficit (NB-1.1) Goal: Nutrition education Formal process to instruct or train a patient/client in a skill or to impart knowledge to help patients/clients voluntarily manage or modify food choices and eating behavior to maintain or improve health. Outcome: Completed/Met Date Met:  04/09/14 Nutrition Education Note  Per pt he has not been following low potassium diet PTA and feels that he eats too large of portion of food for his DM. Pt's wife does the cooking. Pt to share info with wife.  RD consulted for Low potassium education. Provided Renal food guide pyramid to patient/family. Reviewed food groups and provided written recommended serving sizes specifically determined for patient's current nutritional status.   Explained why diet restrictions are needed and provided lists of foods to limit/avoid that are high potassium. Provided specific recommendations on safer alternatives of these foods. Strongly encouraged compliance of this diet.    Pt discouraged that he likes many of the foods that are restricted but able to identify foods that are allowed that he likes. Teach back method used. Contact info provided.   Expect fair compliance.  Current diet order is Renal/CHO Modified, patient is consuming approximately 100% of meals at this time. Labs and medications reviewed. No further nutrition interventions warranted at this time. RD contact information provided. If additional nutrition issues arise, please re-consult RD.  [place], [place], [place] Pager [phone] After Hours Pager

## 2014-04-09 NOTE — Progress Notes (Signed)
  Echocardiogram 2D Echocardiogram has been performed.  Joelene Millin 04/09/2014, 1:40 PM

## 2014-04-09 NOTE — Progress Notes (Signed)
Report given to [facility]. afleming RN

## 2014-04-09 NOTE — Progress Notes (Signed)
Patient d/c this evening, D/c instructions given and gone over with him and wife. All questions answered. Assessments remains unchanged prior to d/c.

## 2014-04-10 ENCOUNTER — Encounter: Payer: Medicare Other | Admitting: Physical Therapy

## 2014-04-12 ENCOUNTER — Telehealth: Payer: Self-pay | Admitting: Nurse Practitioner

## 2014-04-12 NOTE — Telephone Encounter (Signed)
Called and left patient a detailed message to please call the office back and schedule an appointment for a hospital follow up

## 2014-04-15 ENCOUNTER — Ambulatory Visit: Payer: Medicare Other | Attending: Internal Medicine | Admitting: Physical Therapy

## 2014-04-15 ENCOUNTER — Encounter: Payer: Self-pay | Admitting: Physical Therapy

## 2014-04-15 DIAGNOSIS — Z4781 Encounter for orthopedic aftercare following surgical amputation: Secondary | ICD-10-CM | POA: Insufficient documentation

## 2014-04-15 DIAGNOSIS — Z7409 Other reduced mobility: Secondary | ICD-10-CM

## 2014-04-15 DIAGNOSIS — R269 Unspecified abnormalities of gait and mobility: Secondary | ICD-10-CM | POA: Insufficient documentation

## 2014-04-15 DIAGNOSIS — Z89611 Acquired absence of right leg above knee: Secondary | ICD-10-CM | POA: Insufficient documentation

## 2014-04-15 DIAGNOSIS — Z89612 Acquired absence of left leg above knee: Secondary | ICD-10-CM | POA: Insufficient documentation

## 2014-04-15 NOTE — Therapy (Signed)
[facility] [address] [place], [place], [address] Phone: [phone]   Fax:  [phone]  Physical Therapy Treatment  Patient Details  Name: Aaron Long MRN: [id number] Date of Birth: 29-Dec-1940 Referring Provider:  Blanchie Serve, MD  Encounter Date: 04/15/2014      PT End of Session - 04/15/14 1303    Visit Number 9   Number of Visits 17   Date for PT Re-Evaluation 05/03/14   PT Start Time 1230   PT Stop Time 1319   PT Time Calculation (min) 49 min   Equipment Utilized During Treatment Gait belt   Activity Tolerance Patient tolerated treatment well   Behavior During Therapy [facility] for tasks assessed/performed      Past Medical History  Diagnosis Date  . Prostate cancer 2010  . PAD (peripheral artery disease)     a. R CEA 2007. b. Hx L BKA in 2013. c. s/p LE angioplasty in 2014. d. Hx R BKA in 2014.  . Gangrene of toe     dry  . Neuromuscular disorder     diabetic neuropathy  . Hypertension   . Hyperlipidemia   . Myocardial infarction 2005  . Peripheral neuropathy   . Constipation     takes Miralax daily  . Hemorrhoids   . Hx of colonic polyps   . History of kidney stones   . Diabetes mellitus   . Coronary artery disease     a. s/p NSTEMI/CABGx4 in 2007 - LIMA-LAD, SVG-optional diagonal, SVVG-OM, SVG-dRCA. b. Nuc 05/2011: nonischemic.  [name] BPH (benign prostatic hypertrophy)   . Abnormality of gait   . DVT (deep venous thrombosis)     a. Lower extremity DVT in 2013.  [name] Hypertrophy of prostate without urinary obstruction and other lower urinary tract symptoms (LUTS)   . Disorder of bone and cartilage, unspecified   . Lower limb amputation, below knee   . Anemia   . Secondary hyperparathyroidism     Secondary Hyperpara- Thyroidism, Renal  . CKD (chronic kidney disease), stage IV   . Ischemic cardiomyopathy     a. EF 40% in 2007. b. 51% by nuc 05/2011.    Past Surgical History  Procedure  Laterality Date  . Prostate surgery  2009    prostatectomy  . Knee cartilage surgery  at age 88    left knee  . Cea      Right  . Coronary artery bypass graft  2005    x 4  . Carotid endarterectomy  2005    right  . Cataract surgery  2011    bilateral  . Colonoscopy    . Cardiac catheterization  05/28/11  . Amputation  06/14/2011    Procedure: AMPUTATION DIGIT;  Surgeon: Elam Dutch, MD;  Location: [facility] OR;  Service: Vascular;  Laterality: Left;  Amputation Left fifth toe  . Amputation  06/16/2011    Procedure: AMPUTATION BELOW KNEE;  Surgeon: Elam Dutch, MD;  Location: [facility];  Service: Vascular;  Laterality: Left;  . I&d extremity  01/31/2012    Procedure: IRRIGATION AND DEBRIDEMENT EXTREMITY;  Surgeon: Elam Dutch, MD;  Location: [facility];  Service: Vascular;  Laterality: Left;  I & D Left BKA   . Amputation Right 06/12/2012    Procedure: AMPUTATION DIGIT;  Surgeon: Elam Dutch, MD;  Location: [facility] OR;  Service: Vascular;  Laterality: Right;  GREAT TOE  . Below knee leg amputation Right 08/08/2012    Dr  Fields  . Amputation Right 08/08/2012    Procedure: AMPUTATION BELOW KNEE;  Surgeon: Elam Dutch, MD;  Location: [facility] OR;  Service: Vascular;  Laterality: Right;  . Abdominal aortagram N/A 05/28/2011    Procedure: ABDOMINAL Maxcine Ham;  Surgeon: Elam Dutch, MD;  Location: [facility] CATH LAB;  Service: Cardiovascular;  Laterality: N/A;  . Abdominal aortagram N/A 06/02/2012    Procedure: ABDOMINAL Maxcine Ham;  Surgeon: Elam Dutch, MD;  Location: [facility] CATH LAB;  Service: Cardiovascular;  Laterality: N/A;  . Abdominal aortagram N/A 06/09/2012    Procedure: ABDOMINAL Maxcine Ham;  Surgeon: Elam Dutch, MD;  Location[facility] CATH LAB;  Service: Cardiovascular;  Laterality: N/A;    There were no vitals filed for this visit.  Visit Diagnosis:  Abnormality of gait  Impaired functional mobility and activity tolerance      Subjective Assessment - 04/15/14 1237    Subjective  Went to hospital for 2-3 days due to potasium high and potential for heart attack. Nothing found to be wrong. Discharged home with no new orders/issues found. No new falls to report. No pain today.   Currently in Pain? No/denies   Pain Score 0-No pain            [facility] Adult PT Treatment/Exercise - 04/15/14 1304    Transfers   Sit to Stand 4: Min guard;4: Min assist;With upper extremity assist;With armrests;From chair/3-in-1   Sit to Stand Details (indicate cue type and reason) cues to scoot forward with first stand and on prosthetic foot placement    Stand to Sit 4: Min guard;With upper extremity assist;With armrests;To chair/3-in-1   Stand to Sit Details cues to use arms to control descent with sitting down   Ambulation/Gait   Ambulation/Gait Yes   Ambulation/Gait Assistance 4: Min assist;4: Min guard   Ambulation/Gait Assistance Details min cues on posture, increased step length bil legs and on increased foot clearance with swing phase                       Ambulation Distance (Feet) 98 Feet  x1, 50 ft x1, 55 ft x 1   Assistive device Rolling walker;Prostheses   Gait Pattern Step-through pattern;Decreased stride length;Decreased step length - right;Decreased step length - left;Narrow base of support;Trunk flexed   Ambulation Surface Level;Indoor   Knee/Hip Exercises: Aerobic   Stationary Bike Scifit x 4 extremities level 3.5 x 8 minutes with goal rpm >/= 35 for strengthening and activity tolerance                Prosthetics   Current prosthetic wear tolerance (days/week)  7 days   Current prosthetic wear tolerance (#hours/day)  all awake hour hours  drying as needed during day   Residual limb condition  intact    Education Provided Residual limb care;Correct ply sock adjustment;Proper wear schedule/adjustment   Person(s) Educated Patient   Education Method Explanation   Education Method Verbalized understanding   Donning Prosthesis Modified independent (device/increased time)    Doffing Prosthesis Modified independent (device/increased time)           PT Short Term Goals - 04/15/14 1506    PT SHORT TERM GOAL #1   Title tolerates wear >10 hours /day without skin issues or pain (Target Date: 04/05/14)   Status Achieved   PT SHORT TERM GOAL #2   Title donnes prostheses correctly independently (Target Date: 04/05/14)   Status Achieved   PT SHORT TERM GOAL #3   Title sit  to/from stand w/c to RW with supervision. (Target Date: 04/05/14)   Baseline 4/4- min guard assist to min assist depending since hospitilization   Status Not Met   PT SHORT TERM GOAL #4   Title ambulates 20' with RW & prostheses with minimal assist. (Target Date: 04/05/14)   Status Achieved           PT Long Term Goals - 03/07/14 0930    PT LONG TERM GOAL #1   Title independent prosthetic care correctly. (Target Date: 05/03/14)   Time 8   Period Weeks   Status New   PT LONG TERM GOAL #2   Title wears prostheses >80% of awake hours daily without skin or pain issues.  (Target Date: 05/03/14)   Time 8   Period Weeks   Status New   PT LONG TERM GOAL #3   Title ambulates 100' with RW & prostheses modified independent including sit to/from stand.  (Target Date: 05/03/14)   Time 8   Period Weeks   Status New   PT LONG TERM GOAL #4   Title reaches 10" & manages clothes in standing with RW support modified independent.  (Target Date: 05/03/14)   Time 8   Period Weeks   Status New           Plan - 04/15/14 1303    Clinical Impression Statement Session initiated by primary PT and once POC/goals was established to be still be appropriate post hospital stay PTA assumed session. Pt tolerated session well with no shortness of breath, HR stable and on pain reported. STG's met except for transfer goal.  Progressing toward LTG's.   Pt will benefit from skilled therapeutic intervention in order to improve on the following deficits Abnormal gait;Decreased balance;Decreased activity tolerance;Decreased  endurance;Decreased knowledge of use of DME;Decreased mobility;Decreased range of motion;Decreased strength   Rehab Potential Good   PT Frequency 2x / week   PT Duration 8 weeks   PT Treatment/Interventions ADLs/Self Care Home Management;DME Instruction;Gait training;Stair training;Functional mobility training;Therapeutic activities;Therapeutic exercise;Balance training;Neuromuscular re-education   PT Next Visit Plan gait & balance with RW, strength   Consulted and Agree with Plan of Care Patient        Problem List Patient Active Problem List   Diagnosis Date Noted  . Uncontrolled hypertension   . Chest pain 04/08/2014  . Acute on chronic renal failure 04/08/2014  . Hypertensive urgency 04/08/2014  . Status post bilateral below knee amputation 04/02/2014  . Other constipation 09/26/2013  . Cervical disc disorder with radiculopathy of cervical region 08/07/2013  . Drug-induced constipation 06/13/2013  . DM type 2, uncontrolled, with renal complications 07/37/1062  . Hypertensive renal disease 06/13/2013  . Encounter for therapeutic drug monitoring 06/13/2013  . DM type 2, uncontrolled, with neuropathy 01/23/2013  . Hyperlipidemia with target LDL less than 100 12/06/2012  . Need for prophylactic vaccination and inoculation against influenza 09/30/2012  . Diabetes mellitus with peripheral vascular disease 09/06/2012  . GERD (gastroesophageal reflux disease) 09/06/2012  . Aftercare following surgery of the circulatory system, [place] 08/03/2012  . Anemia in chronic kidney disease(285.21) 07/24/2012  . Atherosclerosis of native arteries of the extremities with ulceration(440.23) 06/01/2012  . Hyperkalemia 04/24/2012  . Diabetic neuropathy, type II diabetes mellitus 07/27/2011  . Atherosclerosis of native arteries of the extremities with intermittent claudication 07/08/2011  . S/P BKA (below knee amputation) 06/22/2011  . Atherosclerosis of native arteries of the extremities with  gangrene 06/10/2011  . CKD (chronic kidney disease) stage 4, GFR  15-29 ml/min 05/02/2011  . HTN (hypertension) 04/28/2011  . CAD (coronary artery disease) 04/28/2011  . PAD (peripheral artery disease) 04/28/2011  . Prostate cancer 04/28/2011    Willow Ora 04/15/2014, 3:10 PM  Willow Ora, PTA, [facility] [address], [place], [place] [address] [phone] 04/15/2014, 3:10 PM

## 2014-04-17 ENCOUNTER — Emergency Department (HOSPITAL_COMMUNITY): Payer: Medicare Other

## 2014-04-17 ENCOUNTER — Encounter (HOSPITAL_COMMUNITY): Payer: Self-pay | Admitting: *Deleted

## 2014-04-17 ENCOUNTER — Inpatient Hospital Stay (HOSPITAL_COMMUNITY)
Admission: EM | Admit: 2014-04-17 | Discharge: 2014-04-25 | DRG: 628 | Disposition: A | Discharge status: Rehabilitation Facility | Payer: Medicare Other | Attending: Internal Medicine | Admitting: Internal Medicine

## 2014-04-17 ENCOUNTER — Ambulatory Visit: Payer: Medicare Other | Admitting: Physical Therapy

## 2014-04-17 DIAGNOSIS — J811 Chronic pulmonary edema: Secondary | ICD-10-CM | POA: Diagnosis not present

## 2014-04-17 DIAGNOSIS — Z8249 Family history of ischemic heart disease and other diseases of the circulatory system: Secondary | ICD-10-CM | POA: Diagnosis not present

## 2014-04-17 DIAGNOSIS — I2510 Atherosclerotic heart disease of native coronary artery without angina pectoris: Secondary | ICD-10-CM | POA: Diagnosis not present

## 2014-04-17 DIAGNOSIS — R001 Bradycardia, unspecified: Secondary | ICD-10-CM | POA: Diagnosis present

## 2014-04-17 DIAGNOSIS — N179 Acute kidney failure, unspecified: Secondary | ICD-10-CM | POA: Diagnosis present

## 2014-04-17 DIAGNOSIS — E1140 Type 2 diabetes mellitus with diabetic neuropathy, unspecified: Secondary | ICD-10-CM | POA: Diagnosis not present

## 2014-04-17 DIAGNOSIS — I129 Hypertensive chronic kidney disease with stage 1 through stage 4 chronic kidney disease, or unspecified chronic kidney disease: Secondary | ICD-10-CM | POA: Diagnosis present

## 2014-04-17 DIAGNOSIS — D638 Anemia in other chronic diseases classified elsewhere: Secondary | ICD-10-CM | POA: Diagnosis not present

## 2014-04-17 DIAGNOSIS — N185 Chronic kidney disease, stage 5: Secondary | ICD-10-CM | POA: Diagnosis not present

## 2014-04-17 DIAGNOSIS — Z992 Dependence on renal dialysis: Secondary | ICD-10-CM | POA: Diagnosis not present

## 2014-04-17 DIAGNOSIS — Z8546 Personal history of malignant neoplasm of prostate: Secondary | ICD-10-CM

## 2014-04-17 DIAGNOSIS — I252 Old myocardial infarction: Secondary | ICD-10-CM | POA: Diagnosis not present

## 2014-04-17 DIAGNOSIS — R569 Unspecified convulsions: Secondary | ICD-10-CM | POA: Diagnosis not present

## 2014-04-17 DIAGNOSIS — Z7982 Long term (current) use of aspirin: Secondary | ICD-10-CM

## 2014-04-17 DIAGNOSIS — E785 Hyperlipidemia, unspecified: Secondary | ICD-10-CM | POA: Diagnosis present

## 2014-04-17 DIAGNOSIS — D649 Anemia, unspecified: Secondary | ICD-10-CM | POA: Diagnosis present

## 2014-04-17 DIAGNOSIS — Z87891 Personal history of nicotine dependence: Secondary | ICD-10-CM

## 2014-04-17 DIAGNOSIS — N184 Chronic kidney disease, stage 4 (severe): Secondary | ICD-10-CM

## 2014-04-17 DIAGNOSIS — I739 Peripheral vascular disease, unspecified: Secondary | ICD-10-CM | POA: Diagnosis present

## 2014-04-17 DIAGNOSIS — N2581 Secondary hyperparathyroidism of renal origin: Secondary | ICD-10-CM | POA: Diagnosis not present

## 2014-04-17 DIAGNOSIS — Z89512 Acquired absence of left leg below knee: Secondary | ICD-10-CM | POA: Diagnosis not present

## 2014-04-17 DIAGNOSIS — I255 Ischemic cardiomyopathy: Secondary | ICD-10-CM | POA: Diagnosis present

## 2014-04-17 DIAGNOSIS — Z419 Encounter for procedure for purposes other than remedying health state, unspecified: Secondary | ICD-10-CM

## 2014-04-17 DIAGNOSIS — E1121 Type 2 diabetes mellitus with diabetic nephropathy: Secondary | ICD-10-CM | POA: Diagnosis present

## 2014-04-17 DIAGNOSIS — I120 Hypertensive chronic kidney disease with stage 5 chronic kidney disease or end stage renal disease: Secondary | ICD-10-CM | POA: Diagnosis not present

## 2014-04-17 DIAGNOSIS — Z951 Presence of aortocoronary bypass graft: Secondary | ICD-10-CM

## 2014-04-17 DIAGNOSIS — Z86718 Personal history of other venous thrombosis and embolism: Secondary | ICD-10-CM | POA: Diagnosis not present

## 2014-04-17 DIAGNOSIS — N186 End stage renal disease: Secondary | ICD-10-CM | POA: Diagnosis not present

## 2014-04-17 DIAGNOSIS — G629 Polyneuropathy, unspecified: Secondary | ICD-10-CM | POA: Diagnosis present

## 2014-04-17 DIAGNOSIS — R079 Chest pain, unspecified: Secondary | ICD-10-CM | POA: Diagnosis not present

## 2014-04-17 DIAGNOSIS — E1129 Type 2 diabetes mellitus with other diabetic kidney complication: Secondary | ICD-10-CM | POA: Diagnosis not present

## 2014-04-17 DIAGNOSIS — N182 Chronic kidney disease, stage 2 (mild): Secondary | ICD-10-CM

## 2014-04-17 DIAGNOSIS — E875 Hyperkalemia: Secondary | ICD-10-CM

## 2014-04-17 DIAGNOSIS — Z794 Long term (current) use of insulin: Secondary | ICD-10-CM | POA: Diagnosis not present

## 2014-04-17 DIAGNOSIS — N189 Chronic kidney disease, unspecified: Secondary | ICD-10-CM | POA: Diagnosis not present

## 2014-04-17 DIAGNOSIS — I10 Essential (primary) hypertension: Secondary | ICD-10-CM | POA: Diagnosis present

## 2014-04-17 DIAGNOSIS — Z89511 Acquired absence of right leg below knee: Secondary | ICD-10-CM | POA: Diagnosis not present

## 2014-04-17 DIAGNOSIS — N183 Chronic kidney disease, stage 3 (moderate): Secondary | ICD-10-CM

## 2014-04-17 DIAGNOSIS — Z89519 Acquired absence of unspecified leg below knee: Secondary | ICD-10-CM | POA: Diagnosis not present

## 2014-04-17 DIAGNOSIS — E1151 Type 2 diabetes mellitus with diabetic peripheral angiopathy without gangrene: Secondary | ICD-10-CM | POA: Diagnosis not present

## 2014-04-17 DIAGNOSIS — R55 Syncope and collapse: Secondary | ICD-10-CM | POA: Diagnosis not present

## 2014-04-17 DIAGNOSIS — R1084 Generalized abdominal pain: Secondary | ICD-10-CM | POA: Diagnosis not present

## 2014-04-17 DIAGNOSIS — Z7902 Long term (current) use of antithrombotics/antiplatelets: Secondary | ICD-10-CM

## 2014-04-17 DIAGNOSIS — R0602 Shortness of breath: Secondary | ICD-10-CM | POA: Diagnosis not present

## 2014-04-17 DIAGNOSIS — K5900 Constipation, unspecified: Secondary | ICD-10-CM | POA: Diagnosis not present

## 2014-04-17 DIAGNOSIS — N19 Unspecified kidney failure: Secondary | ICD-10-CM

## 2014-04-17 DIAGNOSIS — N181 Chronic kidney disease, stage 1: Secondary | ICD-10-CM

## 2014-04-17 DIAGNOSIS — Z87898 Personal history of other specified conditions: Secondary | ICD-10-CM

## 2014-04-17 DIAGNOSIS — R5381 Other malaise: Secondary | ICD-10-CM | POA: Diagnosis not present

## 2014-04-17 DIAGNOSIS — Z452 Encounter for adjustment and management of vascular access device: Secondary | ICD-10-CM | POA: Diagnosis not present

## 2014-04-17 HISTORY — DX: Type 2 diabetes mellitus without complications: E11.9

## 2014-04-17 HISTORY — DX: Other complications of anesthesia, initial encounter: T88.59XA

## 2014-04-17 HISTORY — DX: Calculus of kidney: N20.0

## 2014-04-17 HISTORY — DX: Adverse effect of unspecified anesthetic, initial encounter: T41.45XA

## 2014-04-17 HISTORY — DX: Type 2 diabetes mellitus with diabetic polyneuropathy: E11.42

## 2014-04-17 LAB — I-STAT CHEM 8, ED
BUN: 75 mg/dL — ABNORMAL HIGH (ref 6–23)
CHLORIDE: 110 mmol/L (ref 96–112)
CREATININE: 6.8 mg/dL — AB (ref 0.50–1.35)
Calcium, Ion: 1.17 mmol/L (ref 1.13–1.30)
Glucose, Bld: 226 mg/dL — ABNORMAL HIGH (ref 70–99)
HEMATOCRIT: 35 % — AB (ref 39.0–52.0)
Hemoglobin: 11.9 g/dL — ABNORMAL LOW (ref 13.0–17.0)
POTASSIUM: 6.8 mmol/L — AB (ref 3.5–5.1)
SODIUM: 139 mmol/L (ref 135–145)
TCO2: 18 mmol/L (ref 0–100)

## 2014-04-17 LAB — CBC
HCT: 34.4 % — ABNORMAL LOW (ref 39.0–52.0)
Hemoglobin: 10.5 g/dL — ABNORMAL LOW (ref 13.0–17.0)
MCH: 29.1 pg (ref 26.0–34.0)
MCHC: 30.5 g/dL (ref 30.0–36.0)
MCV: 95.3 fL (ref 78.0–100.0)
PLATELETS: 181 10*3/uL (ref 150–400)
RBC: 3.61 MIL/uL — ABNORMAL LOW (ref 4.22–5.81)
RDW: 14.9 % (ref 11.5–15.5)
WBC: 7.8 10*3/uL (ref 4.0–10.5)

## 2014-04-17 LAB — URINE MICROSCOPIC-ADD ON

## 2014-04-17 LAB — COMPREHENSIVE METABOLIC PANEL
ALT: 38 U/L (ref 0–53)
AST: 29 U/L (ref 0–37)
Albumin: 3.6 g/dL (ref 3.5–5.2)
Alkaline Phosphatase: 121 U/L — ABNORMAL HIGH (ref 39–117)
Anion gap: 11 (ref 5–15)
BUN: 77 mg/dL — ABNORMAL HIGH (ref 6–23)
CALCIUM: 8.9 mg/dL (ref 8.4–10.5)
CHLORIDE: 105 mmol/L (ref 96–112)
CO2: 20 mmol/L (ref 19–32)
CREATININE: 7.13 mg/dL — AB (ref 0.50–1.35)
GFR calc Af Amer: 8 mL/min — ABNORMAL LOW (ref >90)
GFR, EST NON AFRICAN AMERICAN: 7 mL/min — AB (ref >90)
Glucose, Bld: 227 mg/dL — ABNORMAL HIGH (ref 70–99)
Potassium: 6.5 mmol/L (ref 3.5–5.1)
Sodium: 136 mmol/L (ref 135–145)
Total Bilirubin: 0.5 mg/dL (ref 0.3–1.2)
Total Protein: 6.4 g/dL (ref 6.0–8.3)

## 2014-04-17 LAB — BASIC METABOLIC PANEL
Anion gap: 8 (ref 5–15)
Anion gap: 7 (ref 5–15)
BUN: 77 mg/dL — AB (ref 6–23)
BUN: 74 mg/dL — ABNORMAL HIGH (ref 6–23)
CO2: 23 mmol/L (ref 19–32)
CO2: 25 mmol/L (ref 19–32)
CREATININE: 7.12 mg/dL — AB (ref 0.50–1.35)
Calcium: 9 mg/dL (ref 8.4–10.5)
Calcium: 8.7 mg/dL (ref 8.4–10.5)
Chloride: 106 mmol/L (ref 96–112)
Chloride: 108 mmol/L (ref 96–112)
Creatinine, Ser: 6.94 mg/dL — ABNORMAL HIGH (ref 0.50–1.35)
GFR, EST AFRICAN AMERICAN: 8 mL/min — AB (ref >90)
GFR, EST AFRICAN AMERICAN: 8 mL/min — AB (ref >90)
GFR, EST NON AFRICAN AMERICAN: 7 mL/min — AB (ref >90)
GFR, EST NON AFRICAN AMERICAN: 7 mL/min — AB (ref >90)
GLUCOSE: 157 mg/dL — AB (ref 70–99)
Glucose, Bld: 228 mg/dL — ABNORMAL HIGH (ref 70–99)
POTASSIUM: 6.9 mmol/L — AB (ref 3.5–5.1)
Potassium: 5.5 mmol/L — ABNORMAL HIGH (ref 3.5–5.1)
Sodium: 137 mmol/L (ref 135–145)
Sodium: 140 mmol/L (ref 135–145)

## 2014-04-17 LAB — I-STAT TROPONIN, ED
TROPONIN I, POC: 0.11 ng/mL — AB (ref 0.00–0.08)
Troponin i, poc: 0.11 ng/mL (ref 0.00–0.08)

## 2014-04-17 LAB — LIPASE, BLOOD: Lipase: 50 U/L (ref 11–59)

## 2014-04-17 LAB — URINALYSIS, ROUTINE W REFLEX MICROSCOPIC
Bilirubin Urine: NEGATIVE
GLUCOSE, UA: 100 mg/dL — AB
Hgb urine dipstick: NEGATIVE
Ketones, ur: NEGATIVE mg/dL
LEUKOCYTES UA: NEGATIVE
Nitrite: NEGATIVE
PH: 5 (ref 5.0–8.0)
PROTEIN: 100 mg/dL — AB
Specific Gravity, Urine: 1.014 (ref 1.005–1.030)
Urobilinogen, UA: 0.2 mg/dL (ref 0.0–1.0)

## 2014-04-17 LAB — CBG MONITORING, ED: Glucose-Capillary: 176 mg/dL — ABNORMAL HIGH (ref 70–99)

## 2014-04-17 LAB — GLUCOSE, CAPILLARY
Glucose-Capillary: 139 mg/dL — ABNORMAL HIGH (ref 70–99)
Glucose-Capillary: 156 mg/dL — ABNORMAL HIGH (ref 70–99)

## 2014-04-17 LAB — TROPONIN I
TROPONIN I: 0.2 ng/mL — AB (ref <0.031)
Troponin I: 0.28 ng/mL — ABNORMAL HIGH (ref <0.031)

## 2014-04-17 MED ORDER — PANTOPRAZOLE SODIUM 20 MG PO TBEC
20.0000 mg | DELAYED_RELEASE_TABLET | Freq: Every day | ORAL | Status: DC
  Administered 2014-04-17 – 2014-04-25 (×9): 20 mg via ORAL
  Filled 2014-04-17 (×9): qty 1

## 2014-04-17 MED ORDER — PREGABALIN 75 MG PO CAPS
75.0000 mg | ORAL_CAPSULE | Freq: Two times a day (BID) | ORAL | Status: DC
  Administered 2014-04-17: 75 mg via ORAL
  Filled 2014-04-17: qty 1

## 2014-04-17 MED ORDER — INSULIN DETEMIR 100 UNIT/ML ~~LOC~~ SOLN
10.0000 [IU] | Freq: Every day | SUBCUTANEOUS | Status: DC
  Administered 2014-04-17 – 2014-04-21 (×5): 10 [IU] via SUBCUTANEOUS
  Filled 2014-04-17 (×5): qty 0.1

## 2014-04-17 MED ORDER — ALBUTEROL SULFATE HFA 108 (90 BASE) MCG/ACT IN AERS: 2.0000 | INHALATION_SPRAY | Freq: Once | RESPIRATORY_TRACT | Status: DC

## 2014-04-17 MED ORDER — SODIUM CHLORIDE 0.9 % IV SOLN
1.0000 g | Freq: Once | INTRAVENOUS | Status: AC
  Administered 2014-04-17: 1 g via INTRAVENOUS
  Filled 2014-04-17: qty 10

## 2014-04-17 MED ORDER — DOCUSATE SODIUM 100 MG PO CAPS
100.0000 mg | ORAL_CAPSULE | Freq: Two times a day (BID) | ORAL | Status: DC
  Administered 2014-04-17 – 2014-04-25 (×16): 100 mg via ORAL
  Filled 2014-04-17 (×19): qty 1

## 2014-04-17 MED ORDER — FUROSEMIDE 10 MG/ML IJ SOLN
160.0000 mg | Freq: Three times a day (TID) | INTRAVENOUS | Status: DC
  Administered 2014-04-17 – 2014-04-18 (×5): 160 mg via INTRAVENOUS
  Filled 2014-04-17 (×8): qty 16

## 2014-04-17 MED ORDER — AMLODIPINE BESYLATE 10 MG PO TABS
10.0000 mg | ORAL_TABLET | Freq: Every day | ORAL | Status: DC
  Administered 2014-04-17 – 2014-04-25 (×9): 10 mg via ORAL
  Filled 2014-04-17 (×5): qty 1
  Filled 2014-04-17: qty 2
  Filled 2014-04-17 (×3): qty 1

## 2014-04-17 MED ORDER — SODIUM POLYSTYRENE SULFONATE 15 GM/60ML PO SUSP
30.0000 g | Freq: Once | ORAL | Status: AC
  Administered 2014-04-17: 30 g via ORAL
  Filled 2014-04-17: qty 120

## 2014-04-17 MED ORDER — FUROSEMIDE 10 MG/ML IJ SOLN
160.0000 mg | Freq: Once | INTRAVENOUS | Status: AC
  Administered 2014-04-17: 160 mg via INTRAVENOUS
  Filled 2014-04-17: qty 16

## 2014-04-17 MED ORDER — ASPIRIN EC 81 MG PO TBEC
81.0000 mg | DELAYED_RELEASE_TABLET | Freq: Every morning | ORAL | Status: DC
  Administered 2014-04-17 – 2014-04-25 (×9): 81 mg via ORAL
  Filled 2014-04-17 (×9): qty 1

## 2014-04-17 MED ORDER — CLOPIDOGREL BISULFATE 75 MG PO TABS
75.0000 mg | ORAL_TABLET | Freq: Every day | ORAL | Status: DC
  Administered 2014-04-17 – 2014-04-25 (×9): 75 mg via ORAL
  Filled 2014-04-17 (×9): qty 1

## 2014-04-17 MED ORDER — DOXAZOSIN MESYLATE 1 MG PO TABS
1.0000 mg | ORAL_TABLET | Freq: Every day | ORAL | Status: DC
  Administered 2014-04-17 – 2014-04-24 (×7): 1 mg via ORAL
  Filled 2014-04-17 (×9): qty 1

## 2014-04-17 MED ORDER — INSULIN ASPART 100 UNIT/ML ~~LOC~~ SOLN
0.0000 [IU] | Freq: Three times a day (TID) | SUBCUTANEOUS | Status: DC
  Administered 2014-04-17: 2 [IU] via SUBCUTANEOUS
  Administered 2014-04-17: 1 [IU] via SUBCUTANEOUS
  Administered 2014-04-18 – 2014-04-19 (×4): 2 [IU] via SUBCUTANEOUS
  Administered 2014-04-20: 7 [IU] via SUBCUTANEOUS
  Administered 2014-04-20 – 2014-04-23 (×8): 2 [IU] via SUBCUTANEOUS
  Administered 2014-04-24 (×2): 1 [IU] via SUBCUTANEOUS
  Administered 2014-04-25: 3 [IU] via SUBCUTANEOUS
  Administered 2014-04-25: 2 [IU] via SUBCUTANEOUS
  Filled 2014-04-17: qty 1

## 2014-04-17 MED ORDER — SODIUM CHLORIDE 0.9 % IJ SOLN
3.0000 mL | Freq: Two times a day (BID) | INTRAMUSCULAR | Status: DC
  Administered 2014-04-17 – 2014-04-25 (×16): 3 mL via INTRAVENOUS
  Filled 2014-04-17: qty 3

## 2014-04-17 MED ORDER — DEXTROSE 50 % IV SOLN
50.0000 mL | Freq: Once | INTRAVENOUS | Status: AC
  Administered 2014-04-17: 50 mL via INTRAVENOUS
  Filled 2014-04-17: qty 50

## 2014-04-17 MED ORDER — PRAVASTATIN SODIUM 20 MG PO TABS
20.0000 mg | ORAL_TABLET | Freq: Every day | ORAL | Status: DC
  Administered 2014-04-17 – 2014-04-25 (×8): 20 mg via ORAL
  Filled 2014-04-17 (×9): qty 1

## 2014-04-17 MED ORDER — INSULIN ASPART 100 UNIT/ML ~~LOC~~ SOLN
5.0000 [IU] | Freq: Once | SUBCUTANEOUS | Status: AC
  Administered 2014-04-17: 5 [IU] via INTRAVENOUS
  Filled 2014-04-17: qty 1

## 2014-04-17 MED ORDER — HEPARIN SODIUM (PORCINE) 5000 UNIT/ML IJ SOLN
5000.0000 [IU] | Freq: Three times a day (TID) | INTRAMUSCULAR | Status: DC
  Administered 2014-04-17 – 2014-04-25 (×20): 5000 [IU] via SUBCUTANEOUS
  Filled 2014-04-17 (×27): qty 1

## 2014-04-17 MED ORDER — PREGABALIN 75 MG PO CAPS
75.0000 mg | ORAL_CAPSULE | Freq: Every day | ORAL | Status: DC
  Administered 2014-04-18 – 2014-04-25 (×7): 75 mg via ORAL
  Filled 2014-04-17 (×7): qty 1

## 2014-04-17 MED ORDER — HYDRALAZINE HCL 50 MG PO TABS
100.0000 mg | ORAL_TABLET | Freq: Three times a day (TID) | ORAL | Status: DC
  Administered 2014-04-17 – 2014-04-24 (×18): 100 mg via ORAL
  Filled 2014-04-17 (×27): qty 2

## 2014-04-17 MED ORDER — HYDROCOD POLST-CHLORPHEN POLST 10-8 MG/5ML PO LQCR: 5.0000 mL | Freq: Once | ORAL | Status: DC

## 2014-04-17 NOTE — H&P (Signed)
History and Physical    Aaron Long SJG:[id number] DOB: 01-Jan-1941 DOA: 04/17/2014  Referring physician: Dr. Colin Rhein  PCP: [name], NP  Specialists: Nephrology, Dr. Mercy Moore  Chief Complaint: syncope, hyperkalemia  HPI: Aaron Long is a 74 y.o. male has a past medical history significant for chronic kidney disease stage IV, followed by Dr. Joelyn Oms as an outpatient, hypertension, hyperlipidemia, coronary artery disease, peripheral vascular disease status post bilateral BKA, diabetes, presents to the emergency room brought by family because of a syncopal episode earlier today. Patient has little recollection about a syncopal episode, however denies any chest pain, denies any shortness of breath, denies any abdominal pain nausea or vomiting. He denies any palpitations. He has a history of hyperkalemia in the setting of poor compliance with his diet, was hospitalized about a week ago for elevated potassium. He and the family reports compliance with his diet as well as his medications, and they are surprised about his potassium Level being high today. He endorses a throat "discomfort / rattling sounds" that started this morning And feel like they are persistent in the emergency room. His wife witnessed a syncopal episode, he was alert and oriented, and all of a sudden rolled his eyes back and did not appear to be breathing. She called 911 and was instructed to do chest compressions, she did about 4 of those and patient came back. He was confused for less than a minute afterwards. The whole episode lasted 1-2 minutes. Family also think that he may have been a little bit more confused today. They did not report any decreased urination. In the emergency room, patient was found to have an elevated potassium of 6.8, confirmed on repeat, and an elevated BUN to 75 and a creatinine of 6.8 from baseline of 4.5 just a week ago. He also has a mild troponin leak of 0.11, stable on repeat.   Review  of Systems: As per history of present illness, otherwise 10 point review of system negative  Past Medical History  Diagnosis Date  . Prostate cancer 2010  . PAD (peripheral artery disease)     a. R CEA 2007. b. Hx L BKA in 2013. c. s/p LE angioplasty in 2014. d. Hx R BKA in 2014.  . Gangrene of toe     dry  . Neuromuscular disorder     diabetic neuropathy  . Hypertension   . Hyperlipidemia   . Myocardial infarction 2005  . Peripheral neuropathy   . Constipation     takes Miralax daily  . Hemorrhoids   . Hx of colonic polyps   . History of kidney stones   . Diabetes mellitus   . Coronary artery disease     a. s/p NSTEMI/CABGx4 in 2007 - LIMA-LAD, SVG-optional diagonal, SVVG-OM, SVG-dRCA. b. Nuc 05/2011: nonischemic.  [name] BPH (benign prostatic hypertrophy)   . Abnormality of gait   . DVT (deep venous thrombosis)     a. Lower extremity DVT in 2013.  [name] Hypertrophy of prostate without urinary obstruction and other lower urinary tract symptoms (LUTS)   . Disorder of bone and cartilage, unspecified   . Lower limb amputation, below knee   . Anemia   . Secondary hyperparathyroidism     Secondary Hyperpara- Thyroidism, Renal  . CKD (chronic kidney disease), stage IV   . Ischemic cardiomyopathy     a. EF 40% in 2007. b. 51% by nuc 05/2011.   Past Surgical History  Procedure Laterality Date  . Prostate surgery  2009    prostatectomy  . Knee cartilage surgery  at age 20    left knee  . Cea      Right  . Coronary artery bypass graft  2005    x 4  . Carotid endarterectomy  2005    right  . Cataract surgery  2011    bilateral  . Colonoscopy    . Cardiac catheterization  05/28/11  . Amputation  06/14/2011    Procedure: AMPUTATION DIGIT;  Surgeon: Elam Dutch, MD;  Location: [facility] OR;  Service: Vascular;  Laterality: Left;  Amputation Left fifth toe  . Amputation  06/16/2011    Procedure: AMPUTATION BELOW KNEE;  Surgeon: Elam Dutch, MD;  Location: [place];  Service: Vascular;   Laterality: Left;  . I&d extremity  01/31/2012    Procedure: IRRIGATION AND DEBRIDEMENT EXTREMITY;  Surgeon: Elam Dutch, MD;  Location: [place];  Service: Vascular;  Laterality: Left;  I & D Left BKA   . Amputation Right 06/12/2012    Procedure: AMPUTATION DIGIT;  Surgeon: Elam Dutch, MD;  Location: [facility] OR;  Service: Vascular;  Laterality: Right;  GREAT TOE  . Below knee leg amputation Right 08/08/2012    Dr Oneida Alar  . Amputation Right 08/08/2012    Procedure: AMPUTATION BELOW KNEE;  Surgeon: Elam Dutch, MD;  Location: [facility] OR;  Service: Vascular;  Laterality: Right;  . Abdominal aortagram N/A 05/28/2011    Procedure: ABDOMINAL Maxcine Ham;  Surgeon: Elam Dutch, MD;  Location: [facility] CATH LAB;  Service: Cardiovascular;  Laterality: N/A;  . Abdominal aortagram N/A 06/02/2012    Procedure: ABDOMINAL Maxcine Ham;  Surgeon: Elam Dutch, MD;  Location: [facility] CATH LAB;  Service: Cardiovascular;  Laterality: N/A;  . Abdominal aortagram N/A 06/09/2012    Procedure: ABDOMINAL Maxcine Ham;  Surgeon: Elam Dutch, MD;  Location[facility] C[facility] LAB;  Service: Cardiovascular;  Laterality: N/A;   Social History:  reports that he quit smoking about 22 years ago. His smoking use included Cigarettes. He has a 40 pack-year smoking history. He has never used smokeless tobacco. He reports that he does not drink alcohol or use illicit drugs.  Allergies  Allergen Reactions  . Oxycodone Other (See Comments)    Hallucinations    Family History  Problem Relation Age of Onset  . Coronary artery disease Neg Hx   . Anesthesia problems Neg Hx   . Hypotension Neg Hx   . Malignant hyperthermia Neg Hx   . Pseudochol deficiency Neg Hx   . Hyperlipidemia Mother   . Hypertension Mother   . Cancer Mother   . Hyperlipidemia Father   . Hypertension Father   . Kidney disease Father   . Heart disease Sister   . Alzheimer's disease Sister   . Cancer Brother   . Other Sister     Prior to Admission medications     Medication Sig Start Date End Date Taking? Authorizing Provider  AMBULATORY NON FORMULARY MEDICATION BD U/P Mini Pen [place] 31GX5MM Use three times daily as directed DX:250.00 08/07/13  Yes Mahima Pandey, MD  AMBULATORY NON FORMULARY MEDICATION One Touch Ultra 2 Test Strips Sig:  Check blood sugars Three times daily to keep blood sugars under control Dx: 250.00, 401.9 11/26/13  Yes Mahima Pandey, MD  amLODipine (NORVASC) 10 MG tablet take 1 tablet by mouth once daily 12/28/13  Yes Mahima Pandey, MD  aspirin EC 81 MG tablet Take 1 tablet (81 mg total) by mouth every morning. 08/21/12  Yes Lavon Paganini Angiulli, PA-C  clopidogrel (PLAVIX) 75 MG tablet take 1 tablet by mouth once daily 02/11/14  Yes Mahima Pandey, MD  docusate sodium (COLACE) 100 MG capsule Take 1 capsule (100 mg total) by mouth 2 (two) times daily. 04/09/14  Yes Maryann Mikhail, DO  doxazosin (CARDURA) 1 MG tablet take 1 tablet by mouth at bedtime 02/11/14  Yes Mahima Pandey, MD  hydrALAZINE (APRESOLINE) 100 MG tablet Take 1 tablet (100 mg total) by mouth every 8 (eight) hours. 04/09/14  Yes Maryann Mikhail, DO  HYDROcodone-acetaminophen (NORCO/VICODIN) 5-325 MG per tablet Take 1 tablet by mouth every 12 (twelve) hours as needed for moderate pain. 04/02/14  Yes Mahima Pandey, MD  insulin aspart (NOVOLOG) 100 UNIT/ML FlexPen Inject 5 Units into the skin 3 (three) times daily with meals. Inject 5 units three times a day with meals if blood sugar greater than 150 only, DX: 250.71 08/07/13  Yes Mahima Pandey, MD  Insulin Detemir (LEVEMIR) 100 UNIT/ML Pen Inject 22 Units into the skin at bedtime. 01/28/14  Yes Tiffany L Reed, DO  metoprolol succinate (TOPROL-XL) 25 MG 24 hr tablet Take 1 tablet (25 mg total) by mouth daily. 08/07/13  Yes Blanchie Serve, MD  Misc. Devices [facility] [place] Patient needs new Wheelchair due to his being broken. 01/09/14  Yes Mahima Pandey, MD  pantoprazole (PROTONIX) 20 MG tablet Take 1 tablet (20 mg total) by mouth  daily. 08/07/13  Yes Mahima Pandey, MD  pravastatin (PRAVACHOL) 20 MG tablet Take 1 tablet (20 mg total) by mouth daily. 08/07/13  Yes Mahima Bubba Camp, MD  pregabalin (LYRICA) 75 MG capsule Take 1 capsule (75 mg total) by mouth daily. Patient taking differently: Take 75 mg by mouth 2 (two) times daily.  04/10/14  Yes Maryann Mikhail, DO  sodium polystyrene (KAYEXALATE) 15 GM/60ML suspension Take 60 mLs (15 g total) by mouth 2 (two) times daily. One day Patient not taking: Reported on 04/17/2014 04/04/14   Blanchie Serve, MD   Physical Exam: Filed Vitals:   04/17/14 0550 04/17/14 0600 04/17/14 0805 04/17/14 0825  BP: 155/52 173/65 172/56 160/63  Pulse: 58 47 56 57  Temp: 98 F (36.7 C)     Resp: 18 18 11 17   Weight: 91.627 kg (202 lb)     SpO2: 96% 96% 96% 96%     General:  No apparent distress  Eyes: no scleral icterus  ENT: moist oropharynx  Neck: supple, no lymphadenopathy  Cardiovascular: regular rate, no JVD   Respiratory: mild bibasilar crackles, scattered wheezing   Abdomen: soft, non tender to palpation, positive bowel sounds, no guarding, no rebound  Skin: no rashes  Musculoskeletal: normal bulk and tone, no joint swelling, bilateral amputee   Psychiatric: normal mood and affect  Neurologic: intermittent hand twitching, non focal otherwise, alert to place year but not month  Labs on Admission:  Basic Metabolic Panel:  Recent Labs Lab 04/17/14 0700 04/17/14 0730 04/17/14 0806  NA 136 137 139  K 6.5* 6.9* 6.8*  CL 105 106 110  CO2 20 23  --   GLUCOSE 227* 228* 226*  BUN 77* 77* 75*  CREATININE 7.13* 7.12* 6.80*  CALCIUM 8.9 9.0  --    Liver Function Tests:  Recent Labs Lab 04/17/14 0700  AST 29  ALT 38  ALKPHOS 121*  BILITOT 0.5  PROT 6.4  ALBUMIN 3.6    Recent Labs Lab 04/17/14 0700  LIPASE 50   CBC:  Recent Labs Lab 04/17/14 0700 04/17/14 0806  WBC  7.8  --   HGB 10.5* 11.9*  HCT 34.4* 35.0*  MCV 95.3  --   PLT 181  --     Radiological Exams on Admission: Dg Chest 2 View  04/17/2014   CLINICAL DATA:  Centralized chest pain with shortness of breath tonight.  EXAM: CHEST  2 VIEW  COMPARISON:  04/08/2014  FINDINGS: Postoperative changes in the mediastinum. Shallow inspiration. Mild cardiac enlargement with central pulmonary vascular congestion. Interstitial opacities suggest mild interstitial edema. No focal consolidation. No blunting of costophrenic angles. No pneumothorax. Calcification of aorta. Degenerative changes in the shoulders.  IMPRESSION: Cardiac enlargement with pulmonary vascular congestion interstitial edema.   Electronically Signed   By: Lucienne Capers M.D.   On: 04/17/2014 06:23   Ct Head Wo Contrast  04/17/2014   CLINICAL DATA:  Syncopal episode today. Seizure. Patient does not remember event.  EXAM: CT HEAD WITHOUT CONTRAST  TECHNIQUE: Contiguous axial images were obtained from the base of the skull through the vertex without intravenous contrast.  COMPARISON:  None.  FINDINGS: Diffuse cerebral atrophy. Ventricular dilatation consistent with central atrophy. Low-attenuation changes in the deep white matter consistent with small vessel ischemia. No mass effect or midline shift. No abnormal extra-axial fluid collections. Gray-white matter junctions are distinct. Basal cisterns are not effaced. No evidence of acute intracranial hemorrhage. No depressed skull fractures. Mucosal thickening in the paranasal sinuses. Mastoid air cells are not opacified. Vascular calcifications.  IMPRESSION: No acute intracranial abnormalities. Chronic atrophy and small vessel ischemic changes.   Electronically Signed   By: Lucienne Capers M.D.   On: 04/17/2014 06:49    EKG: Independently reviewed. Sinus rhythm, large T waves V2-3, similar to previous findings  Assessment/Plan Principal Problem:   Hyperkalemia Active Problems:   CAD (coronary artery disease)   PAD (peripheral artery disease)   CKD (chronic kidney disease)  stage 4, GFR 15-29 ml/min   Diabetes mellitus with peripheral vascular disease   Hypertensive renal disease   Status post bilateral below knee amputation   Acute on chronic renal failure   Uncontrolled hypertension   Syncope   Syncopal episode - may be related to the elevated potassium, and arrhythmia cannot be entirely ruled out. Will admit patient on telemetry. He recently had a 2-D echocardiogram on 04/09/2014 which showed an ejection fraction of 60-65%, mild LVH, Grade 1 diastolic dysfunction without any wall motion abnormalities  Troponin elevation - likely in the setting of renal failure, no chest pain, his troponin on repeat is unchanged. Given syncopal episode, we'll cycle x 3 as the first 2 were within 30 min of each other.  Hyperkalemia - likely in the setting of renal failure which is worsened significantly in the last week. Patient received dextrose and insulin in the emergency room, as well as calcium gluconate and Kayexalate. We'll administer one time Lasix per nephrology as well.  Acute on chronic renal failure with underlying CKD stage IV-V - patient has been rather worried about his AV fistula placement as an outpatient and has been postponing this, currently he is hyperkalemic, looks mildly fluid overloaded with a chest x-ray showing vascular congestion, he is a little bit confused as well as has a muscle twitching that may represent very mild uremic symptoms. Nephrology was consulted, I spoke with Dr. Mercy Moore, currently recommending IV Lasix. Patient may be heading towards dialysis soon.  Bradycardia - Noticed on telemetry, heart rate as low as mid 40s, given #1 will hold metoprolol for now and continue to monitor on telemetry.  HTN -  resume his home antihypertensives   Diabetes mellitus - uncontrolled, most recent hemoglobin A1c of 7.2, with renal and vascular complications. Given renal failure, we'll decrease his Levemir home dose from 22 units to 10 units daily,  continue sliding scale.  PAD - bilateral amputee   Diet: renal Fluids: none DVT Prophylaxis: heparin s.q.  Code Status: Full  Family Communication: d/w wife and daughter bedside  Disposition Plan: admit to telemetry   Time spent: 32  Costin M. Cruzita Lederer, MD [facility] Pager [phone]  If 7PM-7AM, please contact night-coverage [email or web address] Password [facility] 04/17/2014, 8:54 AM

## 2014-04-17 NOTE — ED Provider Notes (Signed)
CSN: [id number]     Arrival date & time 04/17/14  0533 History   First MD Initiated Contact with Patient 04/17/14 [phone]     Chief Complaint  Patient presents with  . Abdominal Pain     (Consider location/radiation/quality/duration/timing/severity/associated sxs/prior Treatment) Patient is a 74 y.o. male presenting with abdominal pain and syncope.  Abdominal Pain Associated symptoms: no chest pain   Loss of Consciousness Episode history:  Single Most recent episode:  Today Duration:  2 seconds Timing:  Constant Progression:  Resolved Chronicity:  New Witnessed: yes   Relieved by: chest compressions. Worsened by:  Nothing tried Ineffective treatments:  None tried Associated symptoms: no anxiety, no chest pain, no dizziness and no recent fall   Associated symptoms comment:  Cough, started tonight   Past Medical History  Diagnosis Date  . Prostate cancer 2010  . PAD (peripheral artery disease)     a. R CEA 2007. b. Hx L BKA in 2013. c. s/p LE angioplasty in 2014. d. Hx R BKA in 2014.  . Gangrene of toe     dry  . Neuromuscular disorder     diabetic neuropathy  . Hypertension   . Hyperlipidemia   . Myocardial infarction 2005  . Peripheral neuropathy   . Constipation     takes Miralax daily  . Hemorrhoids   . Hx of colonic polyps   . History of kidney stones   . Diabetes mellitus   . Coronary artery disease     a. s/p NSTEMI/CABGx4 in 2007 - LIMA-LAD, SVG-optional diagonal, SVVG-OM, SVG-dRCA. b. Nuc 05/2011: nonischemic.  [name] BPH (benign prostatic hypertrophy)   . Abnormality of gait   . DVT (deep venous thrombosis)     a. Lower extremity DVT in 2013.  [name] Hypertrophy of prostate without urinary obstruction and other lower urinary tract symptoms (LUTS)   . Disorder of bone and cartilage, unspecified   . Lower limb amputation, below knee   . Anemia   . Secondary hyperparathyroidism     Secondary Hyperpara- Thyroidism, Renal  . CKD (chronic kidney disease), stage IV   .  Ischemic cardiomyopathy     a. EF 40% in 2007. b. 51% by nuc 05/2011.   Past Surgical History  Procedure Laterality Date  . Prostate surgery  2009    prostatectomy  . Knee cartilage surgery  at age 47    left knee  . Cea      Right  . Coronary artery bypass graft  2005    x 4  . Carotid endarterectomy  2005    right  . Cataract surgery  2011    bilateral  . Colonoscopy    . Cardiac catheterization  05/28/11  . Amputation  06/14/2011    Procedure: AMPUTATION DIGIT;  Surgeon: Elam Dutch, MD;  Location: [facility] OR;  Service: Vascular;  Laterality: Left;  Amputation Left fifth toe  . Amputation  06/16/2011    Procedure: AMPUTATION BELOW KNEE;  Surgeon: Elam Dutch, MD;  Location: [place];  Service: Vascular;  Laterality: Left;  . I&d extremity  01/31/2012    Procedure: IRRIGATION AND DEBRIDEMENT EXTREMITY;  Surgeon: Elam Dutch, MD;  Location: [place];  Service: Vascular;  Laterality: Left;  I & D Left BKA   . Amputation Right 06/12/2012    Procedure: AMPUTATION DIGIT;  Surgeon: Elam Dutch, MD;  Location: [facility] OR;  Service: Vascular;  Laterality: Right;  GREAT TOE  . Below knee leg amputation Right 08/08/2012  Dr Oneida Alar  . Amputation Right 08/08/2012    Procedure: AMPUTATION BELOW KNEE;  Surgeon: Elam Dutch, MD;  Location: [facility] OR;  Service: Vascular;  Laterality: Right;  . Abdominal aortagram N/A 05/28/2011    Procedure: ABDOMINAL Maxcine Ham;  Surgeon: Elam Dutch, MD;  Location: [facility] CATH LAB;  Service: Cardiovascular;  Laterality: N/A;  . Abdominal aortagram N/A 06/02/2012    Procedure: ABDOMINAL Maxcine Ham;  Surgeon: Elam Dutch, MD;  Location: [facility] CATH LAB;  Service: Cardiovascular;  Laterality: N/A;  . Abdominal aortagram N/A 06/09/2012    Procedure: ABDOMINAL Maxcine Ham;  Surgeon: Elam Dutch, MD;  Location: [facility] CATH LAB;  Service: Cardiovascular;  Laterality: N/A;   Family History  Problem Relation Age of Onset  . Coronary artery disease Neg Hx   . Anesthesia  problems Neg Hx   . Hypotension Neg Hx   . Malignant hyperthermia Neg Hx   . Pseudochol deficiency Neg Hx   . Hyperlipidemia Mother   . Hypertension Mother   . Cancer Mother   . Hyperlipidemia Father   . Hypertension Father   . Kidney disease Father   . Heart disease Sister   . Alzheimer's disease Sister   . Cancer Brother   . Other Sister    History  Substance Use Topics  . Smoking status: Former Smoker -- 1.00 packs/day for 40 years    Types: Cigarettes    Quit date: 04/28/1991  . Smokeless tobacco: Never Used     Comment: occ alcohol  . Alcohol Use: No    Review of Systems  Cardiovascular: Positive for syncope. Negative for chest pain.  Gastrointestinal: Positive for abdominal pain.  Neurological: Negative for dizziness.  All other systems reviewed and are negative.     Allergies  Oxycodone  Home Medications   Prior to Admission medications   Medication Sig Start Date End Date Taking? Authorizing Provider  AMBULATORY NON FORMULARY MEDICATION BD U/P Mini Pen Neddles 31GX5MM Use three times daily as directed DX:250.00 08/07/13  Yes Mahima Pandey, MD  AMBULATORY NON FORMULARY MEDICATION One Touch Ultra 2 Test Strips Sig:  Check blood sugars Three times daily to keep blood sugars under control Dx: 250.00, 401.9 11/26/13  Yes Mahima Pandey, MD  amLODipine (NORVASC) 10 MG tablet take 1 tablet by mouth once daily 12/28/13  Yes Mahima Pandey, MD  aspirin EC 81 MG tablet Take 1 tablet (81 mg total) by mouth every morning. 08/21/12  Yes Daniel J Angiulli, PA-C  clopidogrel (PLAVIX) 75 MG tablet take 1 tablet by mouth once daily 02/11/14  Yes Mahima Pandey, MD  docusate sodium (COLACE) 100 MG capsule Take 1 capsule (100 mg total) by mouth 2 (two) times daily. 04/09/14  Yes Maryann Mikhail, DO  doxazosin (CARDURA) 1 MG tablet take 1 tablet by mouth at bedtime 02/11/14  Yes Mahima Pandey, MD  hydrALAZINE (APRESOLINE) 100 MG tablet Take 1 tablet (100 mg total) by mouth every 8  (eight) hours. 04/09/14  Yes Maryann Mikhail, DO  HYDROcodone-acetaminophen (NORCO/VICODIN) 5-325 MG per tablet Take 1 tablet by mouth every 12 (twelve) hours as needed for moderate pain. 04/02/14  Yes Mahima Pandey, MD  insulin aspart (NOVOLOG) 100 UNIT/ML FlexPen Inject 5 Units into the skin 3 (three) times daily with meals. Inject 5 units three times a day with meals if blood sugar greater than 150 only, DX: 250.71 08/07/13  Yes Mahima Pandey, MD  Insulin Detemir (LEVEMIR) 100 UNIT/ML Pen Inject 22 Units into the skin at bedtime. 01/28/14  Yes Tiffany L Reed, DO  metoprolol succinate (TOPROL-XL) 25 MG 24 hr tablet Take 1 tablet (25 mg total) by mouth daily. 08/07/13  Yes Blanchie Serve, MD  Misc. Devices [facility] [place] Patient needs new Wheelchair due to his being broken. 01/09/14  Yes Mahima Pandey, MD  pantoprazole (PROTONIX) 20 MG tablet Take 1 tablet (20 mg total) by mouth daily. 08/07/13  Yes Mahima Pandey, MD  pravastatin (PRAVACHOL) 20 MG tablet Take 1 tablet (20 mg total) by mouth daily. 08/07/13  Yes Mahima Bubba Camp, MD  pregabalin (LYRICA) 75 MG capsule Take 1 capsule (75 mg total) by mouth daily. Patient taking differently: Take 75 mg by mouth 2 (two) times daily.  04/10/14  Yes Maryann Mikhail, DO  sodium polystyrene (KAYEXALATE) 15 GM/60ML suspension Take 60 mLs (15 g total) by mouth 2 (two) times daily. One day Patient not taking: Reported on 04/17/2014 04/04/14   Blanchie Serve, MD   BP 172/56 mmHg  Pulse 56  Temp(Src) 98 F (36.7 C)  Resp 11  Wt 202 lb (91.627 kg)  SpO2 96% Physical Exam  Constitutional: He is oriented to person, place, and time. He appears well-developed and well-nourished.  HENT:  Head: Normocephalic and atraumatic.  Eyes: Conjunctivae and EOM are normal.  Neck: Normal range of motion. Neck supple.  Cardiovascular: Normal rate, regular rhythm and normal heart sounds.   Pulmonary/Chest: Effort normal and breath sounds normal. No respiratory distress.  Abdominal:  He exhibits no distension. There is no tenderness. There is no rebound and no guarding.  Musculoskeletal: Normal range of motion.  Neurological: He is alert and oriented to person, place, and time.  MAE  Skin: Skin is warm and dry.  Vitals reviewed.   ED Course  Procedures (including critical care time) Labs Review Labs Reviewed  CBC - Abnormal; Notable for the following:    RBC 3.61 (*)    Hemoglobin 10.5 (*)    HCT 34.4 (*)    All other components within normal limits  COMPREHENSIVE METABOLIC PANEL - Abnormal; Notable for the following:    Potassium 6.5 (*)    Glucose, Bld 227 (*)    BUN 77 (*)    Creatinine, Ser 7.13 (*)    Alkaline Phosphatase 121 (*)    GFR calc non Af Amer 7 (*)    GFR calc Af Amer 8 (*)    All other components within normal limits  I-STAT TROPOININ, ED - Abnormal; Notable for the following:    Troponin i, poc 0.11 (*)    All other components within normal limits  I-STAT TROPOININ, ED - Abnormal; Notable for the following:    Troponin i, poc 0.11 (*)    All other components within normal limits  I-STAT CHEM 8, ED - Abnormal; Notable for the following:    Potassium 6.8 (*)    BUN 75 (*)    Creatinine, Ser 6.80 (*)    Glucose, Bld 226 (*)    Hemoglobin 11.9 (*)    HCT 35.0 (*)    All other components within normal limits  LIPASE, BLOOD  URINALYSIS, ROUTINE W REFLEX MICROSCOPIC  BASIC METABOLIC PANEL    Imaging Review Dg Chest 2 View  04/17/2014   CLINICAL DATA:  Centralized chest pain with shortness of breath tonight.  EXAM: CHEST  2 VIEW  COMPARISON:  04/08/2014  FINDINGS: Postoperative changes in the mediastinum. Shallow inspiration. Mild cardiac enlargement with central pulmonary vascular congestion. Interstitial opacities suggest mild interstitial edema. No focal consolidation. No blunting  of costophrenic angles. No pneumothorax. Calcification of aorta. Degenerative changes in the shoulders.  IMPRESSION: Cardiac enlargement with pulmonary  vascular congestion interstitial edema.   Electronically Signed   By: Lucienne Capers M.D.   On: 04/17/2014 06:23   Ct Head Wo Contrast  04/17/2014   CLINICAL DATA:  Syncopal episode today. Seizure. Patient does not remember event.  EXAM: CT HEAD WITHOUT CONTRAST  TECHNIQUE: Contiguous axial images were obtained from the base of the skull through the vertex without intravenous contrast.  COMPARISON:  None.  FINDINGS: Diffuse cerebral atrophy. Ventricular dilatation consistent with central atrophy. Low-attenuation changes in the deep white matter consistent with small vessel ischemia. No mass effect or midline shift. No abnormal extra-axial fluid collections. Gray-white matter junctions are distinct. Basal cisterns are not effaced. No evidence of acute intracranial hemorrhage. No depressed skull fractures. Mucosal thickening in the paranasal sinuses. Mastoid air cells are not opacified. Vascular calcifications.  IMPRESSION: No acute intracranial abnormalities. Chronic atrophy and small vessel ischemic changes.   Electronically Signed   By: Lucienne Capers M.D.   On: 04/17/2014 06:49     EKG Interpretation   Date/Time:  Wednesday April 17 2014 07:00:04 EDT Ventricular Rate:  59 PR Interval:  126 QRS Duration: 97 QT Interval:  430 QTC Calculation: 426 R Axis:   -12 Text Interpretation:  Ectopic atrial rhythm Minimal ST elevation, anterior  leads No significant change since last tracing Confirmed by Debby Freiberg [phone]) on 04/17/2014 7:55:44 AM     CRITICAL CARE Performed by: Debby Freiberg   Total critical care time: 45 min  Critical care time was exclusive of separately billable procedures and treating other patients.  Critical care was necessary to treat or prevent imminent or life-threatening deterioration.  Critical care was time spent personally by me on the following activities: development of treatment plan with patient and/or surrogate as well as nursing, discussions with  consultants, evaluation of patient's response to treatment, examination of patient, obtaining history from patient or surrogate, ordering and performing treatments and interventions, ordering and review of laboratory studies, ordering and review of radiographic studies, pulse oximetry and re-evaluation of patient's condition.  MDM   Final diagnoses:  Acute on chronic renal failure  Hyperkalemia    74 y.o. male with pertinent PMH of CKD, CAD, prior MI presents with syncope vs seizure.  Pt and family deny chest pain.  Family member states that pt was rattling during sleep, she awoke him, and shortly after awakening he became unresponsive, his eyes rolled into the back of his head, and she began chest compressions for approximately 1 minute before the pt awoke.  No generalized seizure activity, however pt maintained postural tone.  Physical exam and vitals on arrival as above.    Wu with acute on chronic renal failure, hyperkalemia.  Spoke with hospitalist for admission.  Given calcium, insulin, d50, and kayexalate.    I have reviewed all laboratory and imaging studies if ordered as above  1. Acute on chronic renal failure   2. Hyperkalemia         Debby Freiberg, MD 04/20/14 [phone]

## 2014-04-17 NOTE — ED Notes (Signed)
Critical results of Istat Trop. 0.11 Lab results reported to Dr.Genrty. ED-Lab

## 2014-04-17 NOTE — Consult Note (Signed)
[facility] and [place]  Reason for consult: new access Consulting provider: Dr. Mercy Moore  History of Present Illness  Aaron Long is a 74 y.o. (28-Feb-1940) male LHD with CKD stage IV who we've been consulted for regarding permanent access. He presented to the [facility] ED for due to syncopal episode. He was hospitalized last week due to hyperkalemia.  The patient is left hand dominant.  The patient has not had previous access procedures. The patient has never had a PPM placed.  History was obtained from chart as patient is somnolent and family was unclear on many parts of his care.  He has a past medical history of hypertension, PAD s/p bilateral BKAs by Dr. Oneida Alar, hyperlipidemia, IDDM and prostate cancer.   On ROS, he denies any chest pain, shortness of breath, weakness or numbness in his extremities, dizziness, lightheadness or abdominal pain. All other systems negative.   Past Medical History  Diagnosis Date  . Prostate cancer 2010  . PAD (peripheral artery disease)     a. R CEA 2007. b. Hx L BKA in 2013. c. s/p LE angioplasty in 2014. d. Hx R BKA in 2014.  . Gangrene of toe     dry  . Neuromuscular disorder     diabetic neuropathy  . Hypertension   . Hyperlipidemia   . Myocardial infarction 2005  . Peripheral neuropathy   . Constipation     takes Miralax daily  . Hemorrhoids   . Hx of colonic polyps   . History of kidney stones   . Diabetes mellitus   . Coronary artery disease     a. s/p NSTEMI/CABGx4 in 2007 - LIMA-LAD, SVG-optional diagonal, SVVG-OM, SVG-dRCA. b. Nuc 05/2011: nonischemic.  [name] BPH (benign prostatic hypertrophy)   . Abnormality of gait   . DVT (deep venous thrombosis)     a. Lower extremity DVT in 2013.  [name] Hypertrophy of prostate without urinary obstruction and other lower urinary tract symptoms (LUTS)   . Disorder of bone and cartilage, unspecified   . Lower limb amputation, below knee   . Anemia   . Secondary  hyperparathyroidism     Secondary Hyperpara- Thyroidism, Renal  . CKD (chronic kidney disease), stage IV   . Ischemic cardiomyopathy     a. EF 40% in 2007. b. 51% by nuc 05/2011.    Past Surgical History  Procedure Laterality Date  . Prostate surgery  2009    prostatectomy  . Knee cartilage surgery  at age 55    left knee  . Cea      Right  . Coronary artery bypass graft  2005    x 4  . Carotid endarterectomy  2005    right  . Cataract surgery  2011    bilateral  . Colonoscopy    . Cardiac catheterization  05/28/11  . Amputation  06/14/2011    Procedure: AMPUTATION DIGIT;  Surgeon: Elam Dutch, MD;  Location: [facility] OR;  Service: Vascular;  Laterality: Left;  Amputation Left fifth toe  . Amputation  06/16/2011    Procedure: AMPUTATION BELOW KNEE;  Surgeon: Elam Dutch, MD;  Location: [place];  Service: Vascular;  Laterality: Left;  . I&d extremity  01/31/2012    Procedure: IRRIGATION AND DEBRIDEMENT EXTREMITY;  Surgeon: Elam Dutch, MD;  Location: [place];  Service: Vascular;  Laterality: Left;  I & D Left BKA   . Amputation Right 06/12/2012    Procedure: AMPUTATION DIGIT;  Surgeon: Juanda Crumble  Antony Blackbird, MD;  Location: [place];  Service: Vascular;  Laterality: Right;  GREAT TOE  . Below knee leg amputation Right 08/08/2012    Dr Oneida Alar  . Amputation Right 08/08/2012    Procedure: AMPUTATION BELOW KNEE;  Surgeon: Elam Dutch, MD;  Location: [facility] OR;  Service: Vascular;  Laterality: Right;  . Abdominal aortagram N/A 05/28/2011    Procedure: ABDOMINAL Maxcine Ham;  Surgeon: Elam Dutch, MD;  Location: [facility] CATH LAB;  Service: Cardiovascular;  Laterality: N/A;  . Abdominal aortagram N/A 06/02/2012    Procedure: ABDOMINAL Maxcine Ham;  Surgeon: Elam Dutch, MD;  Location: [facility] CATH LAB;  Service: Cardiovascular;  Laterality: N/A;  . Abdominal aortagram N/A 06/09/2012    Procedure: ABDOMINAL Maxcine Ham;  Surgeon: Elam Dutch, MD;  Location: [facility] CATH LAB;  Service: Cardiovascular;   Laterality: N/A;    History   Social History  . Marital Status: Married    Spouse Name: N/A  . Number of Children: 3  . Years of Education: N/A   Occupational History  . Retired-[employer]    Social History Main Topics  . Smoking status: Former Smoker -- 1.00 packs/day for 40 years    Types: Cigarettes    Quit date: 04/28/1991  . Smokeless tobacco: Never Used     Comment: occ alcohol  . Alcohol Use: No  . Drug Use: No  . Sexual Activity: Not Currently   Other Topics Concern  . Not on file   Social History Narrative    Family History  Problem Relation Age of Onset  . Coronary artery disease Neg Hx   . Anesthesia problems Neg Hx   . Hypotension Neg Hx   . Malignant hyperthermia Neg Hx   . Pseudochol deficiency Neg Hx   . Hyperlipidemia Mother   . Hypertension Mother   . Cancer Mother   . Hyperlipidemia Father   . Hypertension Father   . Kidney disease Father   . Heart disease Sister   . Alzheimer's disease Sister   . Cancer Brother   . Other Sister     No current facility-administered medications on file prior to encounter.   Current Outpatient Prescriptions on File Prior to Encounter  Medication Sig Dispense Refill  . AMBULATORY NON FORMULARY MEDICATION BD U/P Mini Pen Neddles 31GX5MM Use three times daily as directed DX:250.00 100 each 11  . AMBULATORY NON FORMULARY MEDICATION One Touch Ultra 2 Test Strips Sig:  Check blood sugars Three times daily to keep blood sugars under control Dx: 250.00, 401.9 100 strip 11  . amLODipine (NORVASC) 10 MG tablet take 1 tablet by mouth once daily 90 tablet 1  . aspirin EC 81 MG tablet Take 1 tablet (81 mg total) by mouth every morning.    . clopidogrel (PLAVIX) 75 MG tablet take 1 tablet by mouth once daily 90 tablet 1  . docusate sodium (COLACE) 100 MG capsule Take 1 capsule (100 mg total) by mouth 2 (two) times daily. 10 capsule 0  . doxazosin (CARDURA) 1 MG tablet take 1 tablet by mouth at bedtime 90  tablet 1  . hydrALAZINE (APRESOLINE) 100 MG tablet Take 1 tablet (100 mg total) by mouth every 8 (eight) hours. 90 tablet 0  . HYDROcodone-acetaminophen (NORCO/VICODIN) 5-325 MG per tablet Take 1 tablet by mouth every 12 (twelve) hours as needed for moderate pain. 60 tablet 0  . insulin aspart (NOVOLOG) 100 UNIT/ML FlexPen Inject 5 Units into the skin 3 (three) times daily with meals.  Inject 5 units three times a day with meals if blood sugar greater than 150 only, DX: 250.71    . Insulin Detemir (LEVEMIR) 100 UNIT/ML Pen Inject 22 Units into the skin at bedtime. 15 mL 3  . metoprolol succinate (TOPROL-XL) 25 MG 24 hr tablet Take 1 tablet (25 mg total) by mouth daily. 90 tablet 1  . Misc. Devices [facility] [place] Patient needs new Wheelchair due to his being broken. 1 each 0  . pantoprazole (PROTONIX) 20 MG tablet Take 1 tablet (20 mg total) by mouth daily. 90 tablet 1  . pravastatin (PRAVACHOL) 20 MG tablet Take 1 tablet (20 mg total) by mouth daily. 90 tablet 3  . pregabalin (LYRICA) 75 MG capsule Take 1 capsule (75 mg total) by mouth daily. (Patient taking differently: Take 75 mg by mouth 2 (two) times daily. ) 30 capsule 0  . sodium polystyrene (KAYEXALATE) 15 GM/60ML suspension Take 60 mLs (15 g total) by mouth 2 (two) times daily. One day (Patient not taking: Reported on 04/17/2014) 120 mL 0    Allergies  Allergen Reactions  . Oxycodone Other (See Comments)    Hallucinations   REVIEW OF SYSTEMS:  (Positives checked otherwise negative)  CARDIOVASCULAR:  []  chest pain, []  chest pressure, []  palpitations, []  shortness of breath when laying flat, []  shortness of breath with exertion,  []  pain in feet when walking, []  pain in feet when laying flat, []  history of blood clot in veins (DVT), []  history of phlebitis, []  swelling in legs, []  varicose veins  PULMONARY:  []  productive cough, []  asthma, []  wheezing  NEUROLOGIC:  []  weakness in arms or legs, []  numbness in arms or legs, []  difficulty  speaking or slurred speech, []  temporary loss of vision in one eye, []  dizziness  HEMATOLOGIC:  []  bleeding problems, []  problems with blood clotting too easily  MUSCULOSKEL:  []  joint pain, []  joint swelling  GASTROINTEST:  []  vomiting blood, []  blood in stool     GENITOURINARY:  []  burning with urination, []  blood in urine  PSYCHIATRIC:  []  history of major depression  INTEGUMENTARY:  []  rashes, []  ulcers  CONSTITUTIONAL:  []  fever, []  chills  Physical Examination  Filed Vitals:   04/17/14 1016 04/17/14 1121 04/17/14 1200 04/17/14 1230  BP: 197/87 156/59 160/55 151/57  Pulse: 65  59 54  Temp:      Resp: 15 17 14 22   Weight:      SpO2: 96% 96% 97% 95%   Body mass index is 30.72 kg/(m^2).  General: sleepy but arousable, WDWN male in NAD  Head: [place]/AT  Neck: Supple, no LAD  Pulmonary: Sym exp, good air movt, mild rales bilaterally   Cardiac: RRR, Nl S1, S2, no Murmurs, rubs or gallops  Vascular: Vessel Right Left  Radial Palpable Palapble  Ulnar Faintly Palpable Not palpable  Carotid Palpable, with bruit Palpable, without bruit  Femoral Not palpable Not palpable  PT BKA BKA  DP BKA BKA   Gastrointestinal: soft, NTND, -G/R, - HSM, - masses, aorta cannot be palpated due to obesity  Musculoskeletal: Bilateral below-knee amputations. M/S 5/5 throughout. Extremities without ischemic changes.   Neurologic: CN 2-12 grossly intact. Intermittent left hand twitching  Psychiatric: Judgment intact, Mood & affect appropriate for pt's clinical situation  Dermatologic: See M/S exam for extremity exam, no rashes otherwise noted  Lymph: no palpable femoral of cervical LAD   Medical Decision Making  Aaron Long is a 74 y.o. male who presents with CKD  stage V not yet requiring dialysis, per nephrology, if his hyperkalemia does not improve, he will require hemodialysis soon. Vein mapping is pending. The patient is left-handed. Will plan for right upper extremity access  during this admission.    I had an extensive discussion with this patient in regards to the nature of access surgery, including risk, benefits, and alternatives.    The patient is aware that the risks of access surgery include but are not limited to: bleeding, infection, steal syndrome, nerve damage, failure of access to mature, and possible need for additional access procedures in the future.  He is agreeable to proceed.   Virgina Jock, PA-C [facility] [place] Office: [phone] Pager: [phone]  04/17/2014, 1:20 PM   Addendum  I have independently interviewed and examined the patient, and I agree with the physician assistant's findings.  Limited history due to somnolence.  Family with limited understanding of his medical problems.  Awaiting vein mapping and then will discuss further his options.  Adele Barthel, MD [facility] [place] Office: [phone] Pager: [phone]  04/17/2014, 5:09 PM

## 2014-04-17 NOTE — ED Notes (Signed)
Dr.Gentry shown results of istat chem8. Ed-lab.

## 2014-04-17 NOTE — ED Notes (Signed)
Patient presents via EMS with them stating this morning he woke up with upper abd tightness.  Denies N/V/D  Did not take his insulin last night (EMS CBG 270) BP 172/78, P60 Last BM Sunday which is normal for him not to go to the BR every day.  Denies urinary symptoms

## 2014-04-17 NOTE — ED Notes (Signed)
Patient denies CP states he was just having some "tightness" in his upper abd.  Denies N/V/D  Stated he did not take his insulin last night but sometime during the night he got up and ate a "big meal"  Wife not at bedside at this time.

## 2014-04-17 NOTE — Consult Note (Signed)
Aaron Long is an 74 y.o. male referred by Dr Cruzita Lederer   Chief Complaint: CKD 5, hyperkalemia HPI: 74yo BM with CKD presented to ER after having short syncopal episode this AM upon sitting up in bed.  Wife says his "tongue got thick " and he just went out but then came to in about 2 minutes.  After that she noticed some trembling of his hands.  Denies any CP prior to this episode. He was seen in ER last week after outpt labs showed high K.  Given kayexalate and came to ER and at that time was only 4.5 but Scr 4.98.  He is followed by Dr Joelyn Oms and last seen 11/15.  He missed his last appt.  He has been reluctant to get an acces placed but says he will do HD.  His Scr 11/15 was 3.39.  Today Scr 7.1 and K 6.5.  He was given kayexalate, ca, glucose, insulin, and lasix.  He denies overt uremic sxs and says appetite has been good and energy level fair.  Past Medical History  Diagnosis Date  . Prostate cancer 2010  . PAD (peripheral artery disease)     a. R CEA 2007. b. Hx L BKA in 2013. c. s/p LE angioplasty in 2014. d. Hx R BKA in 2014.  . Gangrene of toe     dry  . Neuromuscular disorder     diabetic neuropathy  . Hypertension   . Hyperlipidemia   . Myocardial infarction 2005  . Peripheral neuropathy   . Constipation     takes Miralax daily  . Hemorrhoids   . Hx of colonic polyps   . History of kidney stones   . Diabetes mellitus   . Coronary artery disease     a. s/p NSTEMI/CABGx4 in 2007 - LIMA-LAD, SVG-optional diagonal, SVVG-OM, SVG-dRCA. b. Nuc 05/2011: nonischemic.  [name] BPH (benign prostatic hypertrophy)   . Abnormality of gait   . DVT (deep venous thrombosis)     a. Lower extremity DVT in 2013.  [name] Hypertrophy of prostate without urinary obstruction and other lower urinary tract symptoms (LUTS)   . Disorder of bone and cartilage, unspecified   . Lower limb amputation, below knee   . Anemia   . Secondary hyperparathyroidism     Secondary Hyperpara- Thyroidism, Renal  . CKD  (chronic kidney disease), stage IV   . Ischemic cardiomyopathy     a. EF 40% in 2007. b. 51% by nuc 05/2011.    Past Surgical History  Procedure Laterality Date  . Prostate surgery  2009    prostatectomy  . Knee cartilage surgery  at age 54    left knee  . Cea      Right  . Coronary artery bypass graft  2005    x 4  . Carotid endarterectomy  2005    right  . Cataract surgery  2011    bilateral  . Colonoscopy    . Cardiac catheterization  05/28/11  . Amputation  06/14/2011    Procedure: AMPUTATION DIGIT;  Surgeon: Elam Dutch, MD;  Location: [facility] OR;  Service: Vascular;  Laterality: Left;  Amputation Left fifth toe  . Amputation  06/16/2011    Procedure: AMPUTATION BELOW KNEE;  Surgeon: Elam Dutch, MD;  Location: [facility];  Service: Vascular;  Laterality: Left;  . I&d extremity  01/31/2012    Procedure: IRRIGATION AND DEBRIDEMENT EXTREMITY;  Surgeon: Elam Dutch, MD;  Location: [facility];  Service: Vascular;  Laterality: Left;  I & D Left BKA   . Amputation Right 06/12/2012    Procedure: AMPUTATION DIGIT;  Surgeon: Elam Dutch, MD;  Location: [facility] OR;  Service: Vascular;  Laterality: Right;  GREAT TOE  . Below knee leg amputation Right 08/08/2012    Dr Oneida Alar  . Amputation Right 08/08/2012    Procedure: AMPUTATION BELOW KNEE;  Surgeon: Elam Dutch, MD;  Location: [facility] OR;  Service: Vascular;  Laterality: Right;  . Abdominal aortagram N/A 05/28/2011    Procedure: ABDOMINAL Maxcine Ham;  Surgeon: Elam Dutch, MD;  Location: [facility] CATH LAB;  Service: Cardiovascular;  Laterality: N/A;  . Abdominal aortagram N/A 06/02/2012    Procedure: ABDOMINAL Maxcine Ham;  Surgeon: Elam Dutch, MD;  Location: [facility] CATH LAB;  Service: Cardiovascular;  Laterality: N/A;  . Abdominal aortagram N/A 06/09/2012    Procedure: ABDOMINAL Maxcine Ham;  Surgeon: Elam Dutch, MD;  Location: [facility] CATH LAB;  Service: Cardiovascular;  Laterality: N/A;    Family History  Problem Relation Age of Onset  .  Coronary artery disease Neg Hx   . Anesthesia problems Neg Hx   . Hypotension Neg Hx   . Malignant hyperthermia Neg Hx   . Pseudochol deficiency Neg Hx   . Hyperlipidemia Mother   . Hypertension Mother   . Cancer Mother   . Hyperlipidemia Father   . Hypertension Father   . Kidney disease Father   . Heart disease Sister   . Alzheimer's disease Sister   . Cancer Brother   . Other Sister    Social History:  reports that he quit smoking about 22 years ago. His smoking use included Cigarettes. He has a 40 pack-year smoking history. He has never used smokeless tobacco. He reports that he does not drink alcohol or use illicit drugs. Married and lives with wife in [place]  Allergies:  Allergies  Allergen Reactions  . Oxycodone Other (See Comments)    Hallucinations     (Not in a hospital admission)   Lab Results: UA: >300 protein   Recent Labs  04/17/14 0700 04/17/14 0806  WBC 7.8  --   HGB 10.5* 11.9*  HCT 34.4* 35.0*  PLT 181  --    BMET  Recent Labs  04/17/14 0700 04/17/14 0730 04/17/14 0806  NA 136 137 139  K 6.5* 6.9* 6.8*  CL 105 106 110  CO2 20 23  --   GLUCOSE 227* 228* 226*  BUN 77* 77* 75*  CREATININE 7.13* 7.12* 6.80*  CALCIUM 8.9 9.0  --    LFT  Recent Labs  04/17/14 0700  PROT 6.4  ALBUMIN 3.6  AST 29  ALT 38  ALKPHOS 121*  BILITOT 0.5   Dg Chest 2 View  04/17/2014   CLINICAL DATA:  Centralized chest pain with shortness of breath tonight.  EXAM: CHEST  2 VIEW  COMPARISON:  04/08/2014  FINDINGS: Postoperative changes in the mediastinum. Shallow inspiration. Mild cardiac enlargement with central pulmonary vascular congestion. Interstitial opacities suggest mild interstitial edema. No focal consolidation. No blunting of costophrenic angles. No pneumothorax. Calcification of aorta. Degenerative changes in the shoulders.  IMPRESSION: Cardiac enlargement with pulmonary vascular congestion interstitial edema.   Electronically Signed   By: Lucienne Capers M.D.   On: 04/17/2014 06:23   Ct Head Wo Contrast  04/17/2014   CLINICAL DATA:  Syncopal episode today. Seizure. Patient does not remember event.  EXAM: CT HEAD WITHOUT CONTRAST  TECHNIQUE: Contiguous axial images were obtained from the  base of the skull through the vertex without intravenous contrast.  COMPARISON:  None.  FINDINGS: Diffuse cerebral atrophy. Ventricular dilatation consistent with central atrophy. Low-attenuation changes in the deep white matter consistent with small vessel ischemia. No mass effect or midline shift. No abnormal extra-axial fluid collections. Gray-white matter junctions are distinct. Basal cisterns are not effaced. No evidence of acute intracranial hemorrhage. No depressed skull fractures. Mucosal thickening in the paranasal sinuses. Mastoid air cells are not opacified. Vascular calcifications.  IMPRESSION: No acute intracranial abnormalities. Chronic atrophy and small vessel ischemic changes.   Electronically Signed   By: Lucienne Capers M.D.   On: 04/17/2014 06:49    ROS: No change in vision No SOB No CP No abd pain.  + constipation No new joint pain No dysuria   PHYSICAL EXAM: Blood pressure 160/55, pulse 59, temperature 98 F (36.7 C), resp. rate 14, weight 91.627 kg (202 lb), SpO2 97 %. HEENT: PERRLA EOMI NECK:No JVD LUNGS:decreased BS bases with few faint crackles CARDIAC:RRR wo MRG ABD:+ BS NTND No HSM EXT:Bil BKA wo edema in stumps Bck trace pre sacral edema NEURO:CNI, Ox3 Mild asterixis  Assessment: 1. CKD 5 secondary to DM/HTN.  He is agreeable to HD and getting an AVF placed.  Discussed that he may need a HD catheter and start HD ASAP if K does not decrease.  2. Hyperkalemia 3. DM 4. HTN 5. Sec HPTH he suppose to be on calcitriol 6. Mild anemia not requiring ESA PLAN: 1. Give another dose of kayexalate 2. IV lasix 3. Repeat K later today 4. Vein Map 5. Ask VVS to see 6. Check PTH 7. Decrease lyrica to q d  only   Mardelle Pandolfi T 04/17/2014, 12:42 PM

## 2014-04-17 NOTE — ED Notes (Signed)
Pt ate full meal tray.

## 2014-04-18 DIAGNOSIS — E1151 Type 2 diabetes mellitus with diabetic peripheral angiopathy without gangrene: Secondary | ICD-10-CM

## 2014-04-18 DIAGNOSIS — R55 Syncope and collapse: Secondary | ICD-10-CM

## 2014-04-18 LAB — RENAL FUNCTION PANEL
Albumin: 3.4 g/dL — ABNORMAL LOW (ref 3.5–5.2)
Anion gap: 12 (ref 5–15)
BUN: 76 mg/dL — ABNORMAL HIGH (ref 6–23)
CALCIUM: 8.5 mg/dL (ref 8.4–10.5)
CO2: 21 mmol/L (ref 19–32)
CREATININE: 7.16 mg/dL — AB (ref 0.50–1.35)
Chloride: 106 mmol/L (ref 96–112)
GFR calc Af Amer: 8 mL/min — ABNORMAL LOW (ref >90)
GFR calc non Af Amer: 7 mL/min — ABNORMAL LOW (ref >90)
GLUCOSE: 171 mg/dL — AB (ref 70–99)
PHOSPHORUS: 5.6 mg/dL — AB (ref 2.3–4.6)
Potassium: 4.9 mmol/L (ref 3.5–5.1)
SODIUM: 139 mmol/L (ref 135–145)

## 2014-04-18 LAB — CBC
HCT: 29.4 % — ABNORMAL LOW (ref 39.0–52.0)
HEMOGLOBIN: 9.1 g/dL — AB (ref 13.0–17.0)
MCH: 29.6 pg (ref 26.0–34.0)
MCHC: 31 g/dL (ref 30.0–36.0)
MCV: 95.8 fL (ref 78.0–100.0)
Platelets: 153 10*3/uL (ref 150–400)
RBC: 3.07 MIL/uL — AB (ref 4.22–5.81)
RDW: 14.9 % (ref 11.5–15.5)
WBC: 7 10*3/uL (ref 4.0–10.5)

## 2014-04-18 LAB — GLUCOSE, CAPILLARY
Glucose-Capillary: 157 mg/dL — ABNORMAL HIGH (ref 70–99)
Glucose-Capillary: 179 mg/dL — ABNORMAL HIGH (ref 70–99)
Glucose-Capillary: 159 mg/dL — ABNORMAL HIGH (ref 70–99)
Glucose-Capillary: 184 mg/dL — ABNORMAL HIGH (ref 70–99)

## 2014-04-18 MED ORDER — RENA-VITE PO TABS
1.0000 | ORAL_TABLET | Freq: Every day | ORAL | Status: DC
  Administered 2014-04-18 – 2014-04-24 (×7): 1 via ORAL
  Filled 2014-04-18 (×9): qty 1

## 2014-04-18 MED ORDER — CEFAZOLIN SODIUM-DEXTROSE 2-3 GM-% IV SOLR
2.0000 g | INTRAVENOUS | Status: AC
  Administered 2014-04-19: 2 g via INTRAVENOUS
  Filled 2014-04-18: qty 50

## 2014-04-18 MED ORDER — NEPRO/CARBSTEADY PO LIQD
237.0000 mL | Freq: Every day | ORAL | Status: DC
  Administered 2014-04-18 – 2014-04-24 (×6): 237 mL via ORAL

## 2014-04-18 MED ORDER — SODIUM POLYSTYRENE SULFONATE 15 GM/60ML PO SUSP
30.0000 g | Freq: Once | ORAL | Status: AC
  Administered 2014-04-18: 30 g via ORAL
  Filled 2014-04-18: qty 120

## 2014-04-18 NOTE — Progress Notes (Signed)
[facility] PROGRESS NOTE  BASHIR MARCHETTI DVV:[id number] DOB: [birth year] DOA: 04/17/2014 PCP: [name], NP  Assessment/Plan:  Principal Problem:   Hyperkalemia: Corrected Active Problems:   Syncope:  Patient was bradycardic on admission. May have been related. Metoprolol stopped. Heart rate currently in the 60s. Continue telemetry.    Near end-stage renal disease: Getting access tomorrow. Will start dialysis as an inpatient.    CAD (coronary artery disease):  Troponin trend flat. EKG without ischemic changes. No chest pain.    PAD (peripheral artery disease), bilateral amputee    Diabetes mellitus with peripheral vascular disease:  CBGs reasonable on lower dose of Levemir.     Status post bilateral below knee amputation    Uncontrolled hypertension   Code Status:  full Family Communication:   multiple at bedside.  Disposition Plan:  home  Consultants:   nephrology  Vascular surgery   Procedures:     Antibiotics:    HPI/Subjective:  feels tired and weak. Minimal dyspnea. No chest pain. Having tremors. Eating well. No vomiting.   Objective: Filed Vitals:   04/18/14 1134  BP:   Pulse:   Temp: 99.1 F (37.3 C)  Resp:     Intake/Output Summary (Last 24 hours) at 04/18/14 1220 Last data filed at 04/18/14 1010  Gross per 24 hour  Intake    666 ml  Output   1375 ml  Net   -709 ml   Filed Weights   04/17/14 0550 04/17/14 1300  Weight: 91.627 kg (202 lb) 94.3 kg (207 lb 14.3 oz)    telemetry: Normal sinus rhythm. Rate 64.  Exam:   General:   asleep. Arousable. Oriented. Occasional myoclonic jerks.   Cardiovascular:  regular rate rhythm without murmurs gallops rubs   Respiratory:  clear to auscultation bilaterally without wheeze rhonchi or rales   Abdomen:  soft nontender nondistended   Ext:  no edema. Bilateral BKA's.   Basic Metabolic Panel:  Recent Labs Lab 04/17/14 0700 04/17/14 0730 04/17/14 0806 04/17/14 1724  04/18/14 0637  NA 136 137 139 140 139  K 6.5* 6.9* 6.8* 5.5* 4.9  CL 105 106 110 108 106  CO2 20 23  --  25 21  GLUCOSE 227* 228* 226* 157* 171*  BUN 77* 77* 75* 74* 76*  CREATININE 7.13* 7.12* 6.80* 6.94* 7.16*  CALCIUM 8.9 9.0  --  8.7 8.5  PHOS  --   --   --   --  5.6*   Liver Function Tests:  Recent Labs Lab 04/17/14 0700 04/18/14 0637  AST 29  --   ALT 38  --   ALKPHOS 121*  --   BILITOT 0.5  --   PROT 6.4  --   ALBUMIN 3.6 3.4*    Recent Labs Lab 04/17/14 0700  LIPASE 50   No results for input(s): AMMONIA in the last 168 hours. CBC:  Recent Labs Lab 04/17/14 0700 04/17/14 0806 04/18/14 0637  WBC 7.8  --  7.0  HGB 10.5* 11.9* 9.1*  HCT 34.4* 35.0* 29.4*  MCV 95.3  --  95.8  PLT 181  --  153   Cardiac Enzymes:  Recent Labs Lab 04/17/14 1202 04/17/14 1724  TROPONINI 0.20* 0.28*   BNP (last 3 results) No results for input(s): BNP in the last 8760 hours.  ProBNP (last 3 results) No results for input(s): PROBNP in the last 8760 hours.  CBG:  Recent Labs Lab 04/17/14 1119 04/17/14 1709 04/17/14 2122 04/18/14 0741 04/18/14 1132  GLUCAP 176* 139* 156* 157* 179*    Recent Results (from the past 240 hour(s))  MRSA PCR Screening     Status: None   Collection Time: 04/09/14  3:06 AM  Result Value Ref Range Status   MRSA by PCR NEGATIVE NEGATIVE Final    Comment:        The GeneXpert MRSA Assay (FDA approved for NASAL specimens only), is one component of a comprehensive MRSA colonization surveillance program. It is not intended to diagnose MRSA infection nor to guide or monitor treatment for MRSA infections.      Studies: Dg Chest 2 View  04/17/2014   CLINICAL DATA:  Centralized chest pain with shortness of breath tonight.  EXAM: CHEST  2 VIEW  COMPARISON:  04/08/2014  FINDINGS: Postoperative changes in the mediastinum. Shallow inspiration. Mild cardiac enlargement with central pulmonary vascular congestion. Interstitial opacities  suggest mild interstitial edema. No focal consolidation. No blunting of costophrenic angles. No pneumothorax. Calcification of aorta. Degenerative changes in the shoulders.  IMPRESSION: Cardiac enlargement with pulmonary vascular congestion interstitial edema.   Electronically Signed   By: Lucienne Capers M.D.   On: 04/17/2014 06:23   Ct Head Wo Contrast  04/17/2014   CLINICAL DATA:  Syncopal episode today. Seizure. Patient does not remember event.  EXAM: CT HEAD WITHOUT CONTRAST  TECHNIQUE: Contiguous axial images were obtained from the base of the skull through the vertex without intravenous contrast.  COMPARISON:  None.  FINDINGS: Diffuse cerebral atrophy. Ventricular dilatation consistent with central atrophy. Low-attenuation changes in the deep white matter consistent with small vessel ischemia. No mass effect or midline shift. No abnormal extra-axial fluid collections. Gray-white matter junctions are distinct. Basal cisterns are not effaced. No evidence of acute intracranial hemorrhage. No depressed skull fractures. Mucosal thickening in the paranasal sinuses. Mastoid air cells are not opacified. Vascular calcifications.  IMPRESSION: No acute intracranial abnormalities. Chronic atrophy and small vessel ischemic changes.   Electronically Signed   By: Lucienne Capers M.D.   On: 04/17/2014 06:49    Scheduled Meds: . amLODipine  10 mg Oral Daily  . aspirin EC  81 mg Oral q morning - 10a  . [START ON 04/19/2014]  ceFAZolin (ANCEF) IV  2 g Intravenous On Call to OR  . clopidogrel  75 mg Oral Daily  . docusate sodium  100 mg Oral BID  . doxazosin  1 mg Oral QHS  . furosemide  160 mg Intravenous TID  . heparin  5,000 Units Subcutaneous 3 times per day  . hydrALAZINE  100 mg Oral 3 times per day  . insulin aspart  0-9 Units Subcutaneous TID WC  . insulin detemir  10 Units Subcutaneous Daily  . multivitamin  1 tablet Oral QHS  . pantoprazole  20 mg Oral Daily  . pravastatin  20 mg Oral Daily  .  pregabalin  75 mg Oral Daily  . sodium chloride  3 mL Intravenous Q12H   Continuous Infusions:   Time spent: 35 minutes  [facility] [email or web address], password [facility] 04/18/2014, 12:20 PM  LOS: 1 day

## 2014-04-18 NOTE — Progress Notes (Signed)
S: CO more fatigue and somnolence O:BP 150/46 mmHg  Pulse 69  Temp(Src) 100.1 F (37.8 C) (Oral)  Resp 18  Ht 5\' 8"  (1.727 m)  Wt 94.3 kg (207 lb 14.3 oz)  BMI 31.62 kg/m2  SpO2 90%  Intake/Output Summary (Last 24 hours) at 04/18/14 1004 Last data filed at 04/18/14 0701  Gross per 24 hour  Intake    426 ml  Output    950 ml  Net   -524 ml   Weight change: 2.673 kg (5 lb 14.3 oz) JSE:GBTDV and alert CVS:RRR no rub Resp: Decreased BS bases Abd: + BS NTND Ext:bil BKA NEURO:CNI Ox3 + asterixis   . amLODipine  10 mg Oral Daily  . aspirin EC  81 mg Oral q morning - 10a  . clopidogrel  75 mg Oral Daily  . docusate sodium  100 mg Oral BID  . doxazosin  1 mg Oral QHS  . furosemide  160 mg Intravenous TID  . heparin  5,000 Units Subcutaneous 3 times per day  . hydrALAZINE  100 mg Oral 3 times per day  . insulin aspart  0-9 Units Subcutaneous TID WC  . insulin detemir  10 Units Subcutaneous Daily  . pantoprazole  20 mg Oral Daily  . pravastatin  20 mg Oral Daily  . pregabalin  75 mg Oral Daily  . sodium chloride  3 mL Intravenous Q12H   Dg Chest 2 View  04/17/2014   CLINICAL DATA:  Centralized chest pain with shortness of breath tonight.  EXAM: CHEST  2 VIEW  COMPARISON:  04/08/2014  FINDINGS: Postoperative changes in the mediastinum. Shallow inspiration. Mild cardiac enlargement with central pulmonary vascular congestion. Interstitial opacities suggest mild interstitial edema. No focal consolidation. No blunting of costophrenic angles. No pneumothorax. Calcification of aorta. Degenerative changes in the shoulders.  IMPRESSION: Cardiac enlargement with pulmonary vascular congestion interstitial edema.   Electronically Signed   By: Lucienne Capers M.D.   On: 04/17/2014 06:23   Ct Head Wo Contrast  04/17/2014   CLINICAL DATA:  Syncopal episode today. Seizure. Patient does not remember event.  EXAM: CT HEAD WITHOUT CONTRAST  TECHNIQUE: Contiguous axial images were obtained from the  base of the skull through the vertex without intravenous contrast.  COMPARISON:  None.  FINDINGS: Diffuse cerebral atrophy. Ventricular dilatation consistent with central atrophy. Low-attenuation changes in the deep white matter consistent with small vessel ischemia. No mass effect or midline shift. No abnormal extra-axial fluid collections. Gray-white matter junctions are distinct. Basal cisterns are not effaced. No evidence of acute intracranial hemorrhage. No depressed skull fractures. Mucosal thickening in the paranasal sinuses. Mastoid air cells are not opacified. Vascular calcifications.  IMPRESSION: No acute intracranial abnormalities. Chronic atrophy and small vessel ischemic changes.   Electronically Signed   By: Lucienne Capers M.D.   On: 04/17/2014 06:49   BMET    Component Value Date/Time   NA 139 04/18/2014 0637   NA 144 04/02/2014 1540   K 4.9 04/18/2014 0637   CL 106 04/18/2014 0637   CO2 21 04/18/2014 0637   GLUCOSE 171* 04/18/2014 0637   GLUCOSE 61* 04/02/2014 1540   BUN 76* 04/18/2014 0637   BUN 45* 04/02/2014 1540   CREATININE 7.16* 04/18/2014 0637   CALCIUM 8.5 04/18/2014 0637   GFRNONAA 7* 04/18/2014 0637   GFRAA 8* 04/18/2014 0637   CBC    Component Value Date/Time   WBC 7.0 04/18/2014 0637   WBC 6.2 04/02/2014 1540   RBC 3.07*  04/18/2014 0637   RBC 3.89* 04/02/2014 1540   HGB 9.1* 04/18/2014 0637   HCT 29.4* 04/18/2014 0637   PLT 153 04/18/2014 0637   MCV 95.8 04/18/2014 0637   MCH 29.6 04/18/2014 0637   MCH 28.3 04/02/2014 1540   MCHC 31.0 04/18/2014 0637   MCHC 30.6* 04/02/2014 1540   RDW 14.9 04/18/2014 0637   RDW 14.9 04/02/2014 1540   LYMPHSABS 2.0 04/02/2014 1540   LYMPHSABS 2.0 04/20/2013 1610   MONOABS 0.6 04/20/2013 1610   EOSABS 0.2 04/02/2014 1540   EOSABS 0.2 04/20/2013 1610   BASOSABS 0.1 04/02/2014 1540   BASOSABS 0.0 04/20/2013 1610     Assessment: 1.  New ESRD.  He is now agreeable to starting HD here in the hospital 2.  Hyperkalemia, resolved 3. DM 4. Sec HPTH  PTH P 5. Anemia  Plan: 1. Check Iron studies 2. Spoke to Dr Bridgett Larsson about getting permcath placed along with AVF.  He will plan both for tomorrow if vein mapping gets done, if not then just permcath 3. Start clip process 4. Plan HD tomorrow after catheter   Tinisha Etzkorn T

## 2014-04-18 NOTE — Plan of Care (Signed)
Problem: Food- and Nutrition-Related Knowledge Deficit (NB-1.1) Goal: Nutrition education Formal process to instruct or train a patient/client in a skill or to impart knowledge to help patients/clients voluntarily manage or modify food choices and eating behavior to maintain or improve health. Outcome: Completed/Met Date Met:  04/18/14 Nutrition Education Note  RD consulted for Renal Education. Provided "Potassium contents in food" and "Healthy Eating with Kidney Disease" to patient/family. Reviewed food groups and provided written recommended serving sizes specifically determined for patient's current nutritional status.   Explained why diet restrictions are needed and provided lists of foods to limit/avoid that are high potassium, sodium, and phosphorus. Provided specific recommendations on safer alternatives of these foods. Strongly encouraged compliance of this diet.   Discussed importance of protein intake at each meal and snack. Provided examples of how to maximize protein intake throughout the day. Discussed need for fluid restriction with dialysis, and renal-friendly beverage options. Teach back method used.  Expect good compliance.  Kallie Locks, MS, RD, LDN Pager # [phone] After hours/ weekend pager # [phone]

## 2014-04-18 NOTE — Progress Notes (Signed)
   Daily Progress Note  Per Nephrology's request, will place [facility] tomorrow.  If venogram available, will place access also.  Otherwise, will aim for Monday.  Adele Barthel, MD [facility] Office: [phone] Pager: [phone]  04/18/2014, 10:19 AM

## 2014-04-18 NOTE — Progress Notes (Signed)
Right  Upper Extremity Vein Map    Cephalic  Segment Diameter Depth Comment  1. Axilla 4.79mm    2. Mid upper arm 4.65mm    3. Above AC 5.68mm    4. In [facility] 5.101mm    5. Below AC 4.80mm    6. Mid forearm 4.34mm  Branch  7. Wrist 4.50mm                      Left Upper Extremity Vein Map    Cephalic  Segment Diameter Depth Comment  1. Axilla 4.23mm    2. Mid upper arm 5.48mm    3. Above AC 4.78mm    4. In [facility] 6.70mm    5. Below AC 3.96mm    6. Mid forearm 4.16mm    7. Wrist 3.71mm                     04/18/2014 10:58 AM Maudry Mayhew, RVT, RDCS, RDMS

## 2014-04-18 NOTE — Progress Notes (Signed)
INITIAL NUTRITION ASSESSMENT  DOCUMENTATION CODES Per approved criteria  -Obesity Unspecified   INTERVENTION: Provide Nepro Shake po once daily, each supplement provides 425 kcal and 19 grams protein.  Renal diet education given to pt and wife.  NUTRITION DIAGNOSIS: Increased nutrient needs related to chronic illness as evidenced by estimated nutrition needs.   Goal: Pt to meet >/= 90% of their estimated nutrition needs   Monitor:  PO intake, weight trends, labs, I/O's  Reason for Assessment: MD consult for assessment of nutrition requirement/status  74 y.o. male  Admitting Dx: Hyperkalemia  ASSESSMENT: Pt with CKD stage IV, hypertension, hyperlipidemia, coronary artery disease, peripheral vascular disease s/p bilateral BKA, diabetes, presents with syncope and hyperkalemia.  Pt reports appetite has been fine currently and PTA at home eating 3 meals a day with no other difficulties. Current meal completion is 85%. Pt has been reporting weakness at home. Weight has been stable. Pt underwent vein mapping today. Pt is getting HD access tomorrow to start HD inpatient. Pt is agreeable to Nepro Shake. RD to order. RD additionally educated pt and wife on a renal diet emphasizing on limiting or avoiding high potassium, phosphorous, and sodium foods. Fluid restriction reviewed. Handouts "Potassium content in foods" and "Healthy Eating with Kidney Disease" were given.  Pt with no observed significant fat or muscle mass loss.  Labs: Low GFR. High BUN, creatinine, phosphorous (5.6).  Height: Ht Readings from Last 1 Encounters:  04/17/14 5\' 8"  (1.727 m)    Weight: Wt Readings from Last 1 Encounters:  04/17/14 207 lb 14.3 oz (94.3 kg)    Ideal Body Weight: 129 lbs--- adjusted for bilateral BKA  % Ideal Body Weight: 160%  Wt Readings from Last 10 Encounters:  04/17/14 207 lb 14.3 oz (94.3 kg)  04/08/14 202 lb 3.2 oz (91.717 kg)  04/02/14 210 lb (95.255 kg)  04/23/13 210 lb  (95.255 kg)  12/06/12 185 lb (83.915 kg)  09/27/12 185 lb (83.915 kg)  09/07/12 185 lb (83.915 kg)  09/06/12 185 lb (83.915 kg)  08/16/12 177 lb (80.287 kg)  08/07/12 212 lb (96.163 kg)    Usual Body Weight: 207 lbs  % Usual Body Weight: 100%  BMI:  Body mass index is 31.62 kg/(m^2). Class I obesity  Adjusted body weight: 90 kg  Estimated Nutritional Needs: Kcal: 2100-2300 Protein: 105-120 grams Fluid: 1.2 L/day  Skin: Intact  Diet Order: Diet renal with fluid restriction Fluid restriction:: 1200 mL Fluid; Room service appropriate?: Yes; Fluid consistency:: Thin  EDUCATION NEEDS: -Education needs identified at this time   Intake/Output Summary (Last 24 hours) at 04/18/14 0948 Last data filed at 04/18/14 0701  Gross per 24 hour  Intake    426 ml  Output    950 ml  Net   -524 ml    Last BM: 4/6  Labs:   Recent Labs Lab 04/17/14 0730 04/17/14 0806 04/17/14 1724 04/18/14 0637  NA 137 139 140 139  K 6.9* 6.8* 5.5* 4.9  CL 106 110 108 106  CO2 23  --  25 21  BUN 77* 75* 74* 76*  CREATININE 7.12* 6.80* 6.94* 7.16*  CALCIUM 9.0  --  8.7 8.5  PHOS  --   --   --  5.6*  GLUCOSE 228* 226* 157* 171*    CBG (last 3)   Recent Labs  04/17/14 1709 04/17/14 2122 04/18/14 0741  GLUCAP 139* 156* 157*    Scheduled Meds: . amLODipine  10 mg Oral Daily  . aspirin  EC  81 mg Oral q morning - 10a  . clopidogrel  75 mg Oral Daily  . docusate sodium  100 mg Oral BID  . doxazosin  1 mg Oral QHS  . furosemide  160 mg Intravenous TID  . heparin  5,000 Units Subcutaneous 3 times per day  . hydrALAZINE  100 mg Oral 3 times per day  . insulin aspart  0-9 Units Subcutaneous TID WC  . insulin detemir  10 Units Subcutaneous Daily  . pantoprazole  20 mg Oral Daily  . pravastatin  20 mg Oral Daily  . pregabalin  75 mg Oral Daily  . sodium chloride  3 mL Intravenous Q12H    Continuous Infusions:   Past Medical History  Diagnosis Date  . PAD (peripheral artery  disease)     a. R CEA 2007. b. Hx L BKA in 2013. c. s/p LE angioplasty in 2014. d. Hx R BKA in 2014.  . Gangrene of toe     dry  . Neuromuscular disorder     diabetic neuropathy  . Hypertension   . Hyperlipidemia   . Peripheral neuropathy   . Constipation     takes Miralax daily  . Hemorrhoids   . Hx of colonic polyps   . Coronary artery disease     a. s/p NSTEMI/CABGx4 in 2007 - LIMA-LAD, SVG-optional diagonal, SVVG-OM, SVG-dRCA. b. Nuc 05/2011: nonischemic.  [name] BPH (benign prostatic hypertrophy)   . Abnormality of gait   . DVT (deep venous thrombosis)     a. Lower extremity DVT in 2013.  [name] Hypertrophy of prostate without urinary obstruction and other lower urinary tract symptoms (LUTS)   . Disorder of bone and cartilage, unspecified   . Lower limb amputation, below knee   . Anemia   . Secondary hyperparathyroidism     Secondary Hyperpara- Thyroidism, Renal  . Ischemic cardiomyopathy     a. EF 40% in 2007. b. 51% by nuc 05/2011.  [name] Complication of anesthesia     "he gets delirious"  . Myocardial infarction 2005  . Type II diabetes mellitus   . Diabetic peripheral neuropathy   . ESRD (end stage renal disease)     "not ready yet for dialysis" (04/17/2014)  . Kidney stones   . Prostate cancer 2010    Past Surgical History  Procedure Laterality Date  . Prostatectomy  2009  . Knee cartilage surgery Left 1964  . Coronary artery bypass graft  2005    CABG X4  . Carotid endarterectomy Right 2005  . Cataract extraction w/ intraocular lens  implant, bilateral Bilateral 2011  . Colonoscopy    . Cardiac catheterization  05/28/11  . Amputation  06/14/2011    Procedure: AMPUTATION DIGIT;  Surgeon: Elam Dutch, MD;  Location[facility];  Service: Vascular;  Laterality: Left;  Amputation Left fifth toe  . Amputation  06/16/2011    Procedure: AMPUTATION BELOW KNEE;  Surgeon: Elam Dutch, MD;  Location: [place];  Service: Vascular;  Laterality: Left;  . I&d extremity  01/31/2012     Procedure: IRRIGATION AND DEBRIDEMENT EXTREMITY;  Surgeon: Elam Dutch, MD;  Location: [place];  Service: Vascular;  Laterality: Left;  I & D Left BKA   . Amputation Right 06/12/2012    Procedure: AMPUTATION DIGIT;  Surgeon: Elam Dutch, MD;  Location: [facility] OR;  Service: Vascular;  Laterality: Right;  GREAT TOE  . Amputation Right 08/08/2012    Procedure: AMPUTATION BELOW KNEE;  Surgeon: Juanda Crumble  Antony Blackbird, MD;  Location: [place];  Service: Vascular;  Laterality: Right;  . Abdominal aortagram N/A 05/28/2011    Procedure: ABDOMINAL Maxcine Ham;  Surgeon: Elam Dutch, MD;  Location[facility] C[facility] LAB;  Service: Cardiovascular;  Laterality: N/A;  . Abdominal aortagram N/A 06/02/2012    Procedure: ABDOMINAL Maxcine Ham;  Surgeon: Elam Dutch, MD;  Location: [facility] CATH LAB;  Service: Cardiovascular;  Laterality: N/A;  . Abdominal aortagram N/A 06/09/2012    Procedure: ABDOMINAL Maxcine Ham;  Surgeon: Elam Dutch, MD;  Location: [facility] CATH LAB;  Service: Cardiovascular;  Laterality: N/A;    Kallie Locks, MS, RD, LDN Pager # [phone] After hours/ weekend pager # [phone]

## 2014-04-19 ENCOUNTER — Inpatient Hospital Stay (HOSPITAL_COMMUNITY): Payer: Medicare Other

## 2014-04-19 ENCOUNTER — Other Ambulatory Visit: Payer: Self-pay | Admitting: *Deleted

## 2014-04-19 ENCOUNTER — Encounter (HOSPITAL_COMMUNITY)
Admission: EM | Disposition: A | Discharge status: Rehabilitation Facility | Payer: Self-pay | Source: Home / Self Care | Attending: Internal Medicine

## 2014-04-19 ENCOUNTER — Inpatient Hospital Stay (HOSPITAL_COMMUNITY): Payer: Medicare Other | Admitting: Anesthesiology

## 2014-04-19 DIAGNOSIS — Z4931 Encounter for adequacy testing for hemodialysis: Secondary | ICD-10-CM

## 2014-04-19 DIAGNOSIS — N186 End stage renal disease: Secondary | ICD-10-CM

## 2014-04-19 HISTORY — PX: INSERTION OF DIALYSIS CATHETER: SHX1324

## 2014-04-19 HISTORY — PX: AV FISTULA PLACEMENT: SHX1204

## 2014-04-19 LAB — GLUCOSE, CAPILLARY
GLUCOSE-CAPILLARY: 139 mg/dL — AB (ref 70–99)
Glucose-Capillary: 192 mg/dL — ABNORMAL HIGH (ref 70–99)
Glucose-Capillary: 158 mg/dL — ABNORMAL HIGH (ref 70–99)
Glucose-Capillary: 184 mg/dL — ABNORMAL HIGH (ref 70–99)
Glucose-Capillary: 227 mg/dL — ABNORMAL HIGH (ref 70–99)

## 2014-04-19 LAB — RENAL FUNCTION PANEL
ALBUMIN: 3.1 g/dL — AB (ref 3.5–5.2)
Anion gap: 8 (ref 5–15)
BUN: 78 mg/dL — ABNORMAL HIGH (ref 6–23)
CALCIUM: 8.3 mg/dL — AB (ref 8.4–10.5)
CO2: 29 mmol/L (ref 19–32)
Chloride: 103 mmol/L (ref 96–112)
Creatinine, Ser: 7.37 mg/dL — ABNORMAL HIGH (ref 0.50–1.35)
GFR, EST AFRICAN AMERICAN: 7 mL/min — AB (ref >90)
GFR, EST NON AFRICAN AMERICAN: 6 mL/min — AB (ref >90)
Glucose, Bld: 186 mg/dL — ABNORMAL HIGH (ref 70–99)
POTASSIUM: 3.8 mmol/L (ref 3.5–5.1)
Phosphorus: 6.3 mg/dL — ABNORMAL HIGH (ref 2.3–4.6)
SODIUM: 140 mmol/L (ref 135–145)

## 2014-04-19 LAB — IRON AND TIBC
IRON: 27 ug/dL — AB (ref 42–165)
Saturation Ratios: 11 % — ABNORMAL LOW (ref 20–55)
TIBC: 240 ug/dL (ref 215–435)
UIBC: 213 ug/dL (ref 125–400)

## 2014-04-19 LAB — HEPATITIS B SURFACE ANTIGEN: HEP B S AG: NEGATIVE

## 2014-04-19 LAB — FERRITIN: Ferritin: 95 ng/mL (ref 22–322)

## 2014-04-19 LAB — PARATHYROID HORMONE, INTACT (NO CA): PTH: 455 pg/mL — ABNORMAL HIGH (ref 15–65)

## 2014-04-19 SURGERY — INSERTION OF DIALYSIS CATHETER
Anesthesia: General | Site: Arm Lower | Laterality: Right

## 2014-04-19 MED ORDER — PROPOFOL 10 MG/ML IV BOLUS
INTRAVENOUS | Status: AC
  Filled 2014-04-19: qty 20

## 2014-04-19 MED ORDER — SODIUM CHLORIDE 0.9 % IV SOLN
INTRAVENOUS | Status: DC
  Administered 2014-04-19: 12:00:00 via INTRAVENOUS

## 2014-04-19 MED ORDER — DOXERCALCIFEROL 4 MCG/2ML IV SOLN
2.0000 ug | INTRAVENOUS | Status: DC
  Administered 2014-04-19 – 2014-04-24 (×3): 2 ug via INTRAVENOUS
  Filled 2014-04-19 (×2): qty 2

## 2014-04-19 MED ORDER — PHENYLEPHRINE HCL 10 MG/ML IJ SOLN
10.0000 mg | INTRAVENOUS | Status: DC | PRN
  Administered 2014-04-19: 15 ug/min via INTRAVENOUS

## 2014-04-19 MED ORDER — LIDOCAINE HCL (CARDIAC) 20 MG/ML IV SOLN
INTRAVENOUS | Status: DC | PRN
  Administered 2014-04-19: 100 mg via INTRAVENOUS

## 2014-04-19 MED ORDER — DOXERCALCIFEROL 4 MCG/2ML IV SOLN
INTRAVENOUS | Status: AC
  Filled 2014-04-19: qty 2

## 2014-04-19 MED ORDER — SODIUM CHLORIDE 0.9 % IR SOLN
Status: DC | PRN
  Administered 2014-04-19: 14:00:00

## 2014-04-19 MED ORDER — DARBEPOETIN ALFA 60 MCG/0.3ML IJ SOSY
60.0000 ug | PREFILLED_SYRINGE | INTRAMUSCULAR | Status: DC
  Administered 2014-04-19: 60 ug via INTRAVENOUS

## 2014-04-19 MED ORDER — FENTANYL CITRATE 0.05 MG/ML IJ SOLN
INTRAMUSCULAR | Status: DC | PRN
  Administered 2014-04-19: 50 ug via INTRAVENOUS

## 2014-04-19 MED ORDER — LIDOCAINE HCL (CARDIAC) 20 MG/ML IV SOLN
INTRAVENOUS | Status: AC
  Filled 2014-04-19: qty 5

## 2014-04-19 MED ORDER — 0.9 % SODIUM CHLORIDE (POUR BTL) OPTIME
TOPICAL | Status: DC | PRN
  Administered 2014-04-19: 1000 mL

## 2014-04-19 MED ORDER — DARBEPOETIN ALFA 60 MCG/0.3ML IJ SOSY
PREFILLED_SYRINGE | INTRAMUSCULAR | Status: AC
  Filled 2014-04-19: qty 0.3

## 2014-04-19 MED ORDER — HEPARIN SODIUM (PORCINE) 1000 UNIT/ML IJ SOLN
INTRAMUSCULAR | Status: DC | PRN
  Administered 2014-04-19: 1000 [IU] via INTRAVENOUS

## 2014-04-19 MED ORDER — BISACODYL 10 MG RE SUPP: 10.0000 mg | Freq: Every day | RECTAL | Status: DC | PRN

## 2014-04-19 MED ORDER — ONDANSETRON HCL 4 MG/2ML IJ SOLN
INTRAMUSCULAR | Status: DC | PRN
  Administered 2014-04-19: 4 mg via INTRAVENOUS

## 2014-04-19 MED ORDER — HEPARIN SODIUM (PORCINE) 1000 UNIT/ML IJ SOLN
INTRAMUSCULAR | Status: AC
  Filled 2014-04-19: qty 1

## 2014-04-19 MED ORDER — EPHEDRINE SULFATE 50 MG/ML IJ SOLN
INTRAMUSCULAR | Status: DC | PRN
  Administered 2014-04-19 (×2): 15 mg via INTRAVENOUS

## 2014-04-19 MED ORDER — FENTANYL CITRATE 0.05 MG/ML IJ SOLN
INTRAMUSCULAR | Status: AC
  Filled 2014-04-19: qty 5

## 2014-04-19 MED ORDER — PROPOFOL 10 MG/ML IV BOLUS
INTRAVENOUS | Status: DC | PRN
  Administered 2014-04-19: 110 mg via INTRAVENOUS

## 2014-04-19 SURGICAL SUPPLY — 59 items
ARMBAND PINK RESTRICT EXTREMIT (MISCELLANEOUS) ×4 IMPLANT
BAG DECANTER FOR FLEXI CONT (MISCELLANEOUS) ×2 IMPLANT
BIOPATCH RED 1 DISK 7.0 (GAUZE/BANDAGES/DRESSINGS) ×3 IMPLANT
BIOPATCH RED 1IN DISK 7.0MM (GAUZE/BANDAGES/DRESSINGS) ×1
CANISTER SUCTION 2500CC (MISCELLANEOUS) ×4 IMPLANT
CATH CANNON HEMO 15FR 19 (HEMODIALYSIS SUPPLIES) ×2 IMPLANT
CLIP TI MEDIUM 6 (CLIP) ×4 IMPLANT
CLIP TI WIDE RED SMALL 6 (CLIP) ×4 IMPLANT
COVER PROBE W GEL 5X96 (DRAPES) IMPLANT
DECANTER SPIKE VIAL GLASS SM (MISCELLANEOUS) ×4 IMPLANT
DRAIN PENROSE 1/4X12 LTX STRL (WOUND CARE) ×2 IMPLANT
DRAPE C-ARM 42X72 X-RAY (DRAPES) ×4 IMPLANT
DRAPE CHEST BREAST 15X10 FENES (DRAPES) ×2 IMPLANT
DRAPE ORTHO SPLIT 77X108 STRL (DRAPES) ×4
DRAPE SURG ORHT 6 SPLT 77X108 (DRAPES) IMPLANT
ELECT REM PT RETURN 9FT ADLT (ELECTROSURGICAL) ×4
ELECTRODE REM PT RTRN 9FT ADLT (ELECTROSURGICAL) ×2 IMPLANT
GAUZE SPONGE 2X2 8PLY STRL LF (GAUZE/BANDAGES/DRESSINGS) ×2 IMPLANT
GAUZE SPONGE 4X4 16PLY XRAY LF (GAUZE/BANDAGES/DRESSINGS) ×4 IMPLANT
GEL ULTRASOUND 20GR AQUASONIC (MISCELLANEOUS) IMPLANT
GLOVE BIOGEL PI IND STRL 6.5 (GLOVE) IMPLANT
GLOVE BIOGEL PI IND STRL 7.0 (GLOVE) IMPLANT
GLOVE BIOGEL PI IND STRL 7.5 (GLOVE) IMPLANT
GLOVE BIOGEL PI INDICATOR 6.5 (GLOVE) ×4
GLOVE BIOGEL PI INDICATOR 7.0 (GLOVE) ×2
GLOVE BIOGEL PI INDICATOR 7.5 (GLOVE) ×2
GLOVE ECLIPSE 6.5 STRL STRAW (GLOVE) ×4 IMPLANT
GLOVE ECLIPSE 7.0 STRL STRAW (GLOVE) ×2 IMPLANT
GLOVE SS BIOGEL STRL SZ 7 (GLOVE) ×2 IMPLANT
GLOVE SUPERSENSE BIOGEL SZ 7 (GLOVE) ×2
GLOVE SURG SS PI 7.0 STRL IVOR (GLOVE) ×8 IMPLANT
GOWN STRL REUS W/ TWL LRG LVL3 (GOWN DISPOSABLE) ×6 IMPLANT
GOWN STRL REUS W/TWL LRG LVL3 (GOWN DISPOSABLE) ×24
KIT BASIN OR (CUSTOM PROCEDURE TRAY) ×4 IMPLANT
KIT ROOM TURNOVER OR (KITS) ×4 IMPLANT
LIQUID BAND (GAUZE/BANDAGES/DRESSINGS) ×6 IMPLANT
NDL 18GX1X1/2 (RX/OR ONLY) (NEEDLE) ×2 IMPLANT
NDL HYPO 25GX1X1/2 BEV (NEEDLE) ×2 IMPLANT
NEEDLE 18GX1X1/2 (RX/OR ONLY) (NEEDLE) IMPLANT
NEEDLE 22X1 1/2 (OR ONLY) (NEEDLE) ×4 IMPLANT
NEEDLE HYPO 25GX1X1/2 BEV (NEEDLE) IMPLANT
NS IRRIG 1000ML POUR BTL (IV SOLUTION) ×4 IMPLANT
PACK CV ACCESS (CUSTOM PROCEDURE TRAY) ×4 IMPLANT
PACK SURGICAL SETUP 50X90 (CUSTOM PROCEDURE TRAY) ×2 IMPLANT
PAD ARMBOARD 7.5X6 YLW CONV (MISCELLANEOUS) ×8 IMPLANT
SOAP 2 % CHG 4 OZ (WOUND CARE) ×4 IMPLANT
SPONGE GAUZE 2X2 STER 10/PKG (GAUZE/BANDAGES/DRESSINGS) ×2
SUT ETHILON 3 0 PS 1 (SUTURE) ×4 IMPLANT
SUT PROLENE 6 0 BV (SUTURE) ×6 IMPLANT
SUT VIC AB 3-0 SH 27 (SUTURE) ×8
SUT VIC AB 3-0 SH 27X BRD (SUTURE) ×2 IMPLANT
SUT VICRYL 4-0 PS2 18IN ABS (SUTURE) ×7 IMPLANT
SYR 20CC LL (SYRINGE) ×4 IMPLANT
SYR 5ML LL (SYRINGE) ×8 IMPLANT
SYR CONTROL 10ML LL (SYRINGE) ×2 IMPLANT
SYRINGE 10CC LL (SYRINGE) ×4 IMPLANT
TAPE CLOTH SURG 4X10 WHT LF (GAUZE/BANDAGES/DRESSINGS) ×2 IMPLANT
UNDERPAD 30X30 INCONTINENT (UNDERPADS AND DIAPERS) ×4 IMPLANT
WATER STERILE IRR 1000ML POUR (IV SOLUTION) ×4 IMPLANT

## 2014-04-19 NOTE — Progress Notes (Signed)
Pt gone down via bed for graft and HD cath placement.

## 2014-04-19 NOTE — Interval H&P Note (Signed)
History and Physical Interval Note:  04/19/2014 1:19 PM  Aaron Long  has presented today for surgery, with the diagnosis of End Stage Renal Disease N18.6  The various methods of treatment have been discussed with the patient and family. After consideration of risks, benefits and other options for treatment, the patient has consented to  Procedure(s): INSERTION OF DIALYSIS CATHETER (N/A) RIGHT ARM ARTERIOVENOUS (AV) FISTULA CREATION (Right) as a surgical intervention .  [name] history has been reviewed, patient examined, no change in status, stable for surgery.  I have reviewed the patient's chart and labs.  Questions were answered to the patient's satisfaction.     [name]

## 2014-04-19 NOTE — Transfer of Care (Signed)
Immediate Anesthesia Transfer of Care Note  Patient: Aaron Long  Procedure(s) Performed: Procedure(s): INSERTION OF DIALYSIS CATHETER-INTERNAL JUGULAR (Right) RIGHT ARM ARTERIOVENOUS (AV) FISTULA CREATION (Right)  Patient Location: PACU  Anesthesia Type:General  Level of Consciousness: awake  Airway & Oxygen Therapy: Patient Spontanous Breathing and Patient connected to nasal cannula oxygen  Post-op Assessment: Report given to RN, Post -op Vital signs reviewed and stable and Patient moving all extremities X 4  Post vital signs: Reviewed and stable  Last Vitals:  Filed Vitals:   04/19/14 0900  BP: 160/61  Pulse: 65  Temp: 37.4 C  Resp: 16   HR 64, R 15, sats 96% on 2L [place], 110/211 Complications: No apparent anesthesia complications

## 2014-04-19 NOTE — Progress Notes (Signed)
S:  No new CO O:BP 160/61 mmHg  Pulse 65  Temp(Src) 99.3 F (37.4 C) (Oral)  Resp 16  Ht 5\' 8"  (1.727 m)  Wt 94.5 kg (208 lb 5.4 oz)  BMI 31.68 kg/m2  SpO2 91%  Intake/Output Summary (Last 24 hours) at 04/19/14 1014 Last data filed at 04/19/14 0500  Gross per 24 hour  Intake    336 ml  Output   1301 ml  Net   -965 ml   Weight change: 0.2 kg (7.1 oz) XUX:YBFXO and alert CVS:RRR no rub Resp: Decreased BS bases Abd: + BS NTND Ext:bil BKA NEURO:CNI Ox3 + asterixis   . amLODipine  10 mg Oral Daily  . aspirin EC  81 mg Oral q morning - 10a  .  ceFAZolin (ANCEF) IV  2 g Intravenous On Call to OR  . clopidogrel  75 mg Oral Daily  . docusate sodium  100 mg Oral BID  . doxazosin  1 mg Oral QHS  . feeding supplement (NEPRO CARB STEADY)  237 mL Oral Q1500  . furosemide  160 mg Intravenous TID  . heparin  5,000 Units Subcutaneous 3 times per day  . hydrALAZINE  100 mg Oral 3 times per day  . insulin aspart  0-9 Units Subcutaneous TID WC  . insulin detemir  10 Units Subcutaneous Daily  . multivitamin  1 tablet Oral QHS  . pantoprazole  20 mg Oral Daily  . pravastatin  20 mg Oral Daily  . pregabalin  75 mg Oral Daily  . sodium chloride  3 mL Intravenous Q12H   No results found. BMET    Component Value Date/Time   NA 140 04/19/2014 0850   NA 144 04/02/2014 1540   K 3.8 04/19/2014 0850   CL 103 04/19/2014 0850   CO2 29 04/19/2014 0850   GLUCOSE 186* 04/19/2014 0850   GLUCOSE 61* 04/02/2014 1540   BUN 78* 04/19/2014 0850   BUN 45* 04/02/2014 1540   CREATININE 7.37* 04/19/2014 0850   CALCIUM 8.3* 04/19/2014 0850   GFRNONAA 6* 04/19/2014 0850   GFRAA 7* 04/19/2014 0850   CBC    Component Value Date/Time   WBC 7.0 04/18/2014 0637   WBC 6.2 04/02/2014 1540   RBC 3.07* 04/18/2014 0637   RBC 3.89* 04/02/2014 1540   HGB 9.1* 04/18/2014 0637   HCT 29.4* 04/18/2014 0637   PLT 153 04/18/2014 0637   MCV 95.8 04/18/2014 0637   MCH 29.6 04/18/2014 0637   MCH 28.3  04/02/2014 1540   MCHC 31.0 04/18/2014 0637   MCHC 30.6* 04/02/2014 1540   RDW 14.9 04/18/2014 0637   RDW 14.9 04/02/2014 1540   LYMPHSABS 2.0 04/02/2014 1540   LYMPHSABS 2.0 04/20/2013 1610   MONOABS 0.6 04/20/2013 1610   EOSABS 0.2 04/02/2014 1540   EOSABS 0.2 04/20/2013 1610   BASOSABS 0.1 04/02/2014 1540   BASOSABS 0.0 04/20/2013 1610     Assessment: 1.  New ESRD.  2. Hyperkalemia, resolved 3. DM 4. Sec HPTH   PTH 455 5. Anemia  Plan: 1.Permcath and AVF today then HD 2. Plan HD again tomorrow 3. Working on outpt spot  4. Start hectorol 5. Start aranesp 6. DC lasix Aaron Long T

## 2014-04-19 NOTE — Anesthesia Procedure Notes (Signed)
Procedure Name: LMA Insertion Date/Time: 04/19/2014 1:51 PM Performed by: Mariea Clonts Oxygen Delivery Method: Circle system utilized Preoxygenation: Pre-oxygenation with 100% oxygen Intubation Type: IV induction LMA: LMA inserted LMA Size: 4.0 Number of attempts: 1 Placement Confirmation: breath sounds checked- equal and bilateral and positive ETCO2 Tube secured with: Tape Dental Injury: Teeth and Oropharynx as per pre-operative assessment

## 2014-04-19 NOTE — Anesthesia Preprocedure Evaluation (Addendum)
Anesthesia Evaluation   Patient confused    Reviewed: Allergy & Precautions, NPO status , Patient's Chart, lab work & pertinent test results, reviewed documented beta blocker date and time   History of Anesthesia Complications (+) Emergence Delirium and history of anesthetic complications  Airway Mallampati: II  TM Distance: >3 FB Neck ROM: Full    Dental  (+) Edentulous Upper, Poor Dentition   Pulmonary former smoker,  breath sounds clear to auscultation        Cardiovascular hypertension, + CAD, + Past MI, + CABG and + Peripheral Vascular Disease Rhythm:Regular     Neuro/Psych  Neuromuscular disease    GI/Hepatic GERD-  ,  Endo/Other  diabetes, Poorly Controlled, Type 2  Renal/GU CRFRenal disease     Musculoskeletal   Abdominal   Peds  Hematology   Anesthesia Other Findings   Reproductive/Obstetrics                           Anesthesia Physical Anesthesia Plan  ASA: IV  Anesthesia Plan: General   Post-op Pain Management:    Induction: Intravenous  Airway Management Planned: LMA  Additional Equipment: None  Intra-op Plan:   Post-operative Plan: Extubation in OR  Informed Consent: I have reviewed the patients History and Physical, chart, labs and discussed the procedure including the risks, benefits and alternatives for the proposed anesthesia with the patient or authorized representative who has indicated his/her understanding and acceptance.   Dental advisory given  Plan Discussed with: CRNA, Anesthesiologist and Surgeon  Anesthesia Plan Comments:        Anesthesia Quick Evaluation

## 2014-04-19 NOTE — Progress Notes (Signed)
Pt had large BM after being assisted to the [facility]. Pt expressing relief from abdominal and rectal discomfort.

## 2014-04-19 NOTE — Progress Notes (Signed)
[facility] PROGRESS NOTE  Aaron Long WGN:[id number] DOB: Apr 06, 1940 DOA: 04/17/2014 PCP: [name], NP  Assessment/Plan:    Hyperkalemia: Corrected    Syncope:  Patient was bradycardic on admission. May have been related. Metoprolol stopped. Heart rate currently in the 60s. Continue telemetry.  end-stage renal disease: Getting access tomorrow. HD per nephro- will need CLIPPED    CAD (coronary artery disease):  Troponin trend flat. EKG without ischemic changes. No chest pain.    PAD (peripheral artery disease), bilateral amputee    Diabetes mellitus with peripheral vascular disease:  CBGs reasonable on lower dose of Levemir.     Status post bilateral below knee amputation    Uncontrolled hypertension   Code Status:  full Family Communication:   No family at bedside Disposition Plan:  home  Consultants:   nephrology  Vascular surgery   Procedures:     Antibiotics:    HPI/Subjective: "when is this procedure, I've been laying here too long"  Objective: Filed Vitals:   04/19/14 0826  BP: 159/57  Pulse: 62  Temp:   Resp:     Intake/Output Summary (Last 24 hours) at 04/19/14 0936 Last data filed at 04/19/14 0500  Gross per 24 hour  Intake    336 ml  Output   1726 ml  Net  -1390 ml   Filed Weights   04/17/14 0550 04/17/14 1300 04/18/14 2100  Weight: 91.627 kg (202 lb) 94.3 kg (207 lb 14.3 oz) 94.5 kg (208 lb 5.4 oz)     Exam:   General:  Awake, NAD   Cardiovascular:  regular rate rhythm without murmurs gallops rubs   Respiratory:  clear to auscultation bilaterally without wheeze rhonchi or rales   Abdomen:  soft nontender nondistended   Ext:  no edema. Bilateral BKA's.   Basic Metabolic Panel:  Recent Labs Lab 04/17/14 0700 04/17/14 0730 04/17/14 0806 04/17/14 1724 04/18/14 0637  NA 136 137 139 140 139  K 6.5* 6.9* 6.8* 5.5* 4.9  CL 105 106 110 108 106  CO2 20 23  --  25 21  GLUCOSE 227* 228* 226* 157* 171*   BUN 77* 77* 75* 74* 76*  CREATININE 7.13* 7.12* 6.80* 6.94* 7.16*  CALCIUM 8.9 9.0  --  8.7 8.5  PHOS  --   --   --   --  5.6*   Liver Function Tests:  Recent Labs Lab 04/17/14 0700 04/18/14 0637  AST 29  --   ALT 38  --   ALKPHOS 121*  --   BILITOT 0.5  --   PROT 6.4  --   ALBUMIN 3.6 3.4*    Recent Labs Lab 04/17/14 0700  LIPASE 50   No results for input(s): AMMONIA in the last 168 hours. CBC:  Recent Labs Lab 04/17/14 0700 04/17/14 0806 04/18/14 0637  WBC 7.8  --  7.0  HGB 10.5* 11.9* 9.1*  HCT 34.4* 35.0* 29.4*  MCV 95.3  --  95.8  PLT 181  --  153   Cardiac Enzymes:  Recent Labs Lab 04/17/14 1202 04/17/14 1724  TROPONINI 0.20* 0.28*   BNP (last 3 results) No results for input(s): BNP in the last 8760 hours.  ProBNP (last 3 results) No results for input(s): PROBNP in the last 8760 hours.  CBG:  Recent Labs Lab 04/18/14 0741 04/18/14 1132 04/18/14 1658 04/18/14 2148 04/19/14 0737  GLUCAP 157* 179* 159* 184* 192*    No results found for this or any previous visit (from the  past 240 hour(s)).   Studies: No results found.  Scheduled Meds: . amLODipine  10 mg Oral Daily  . aspirin EC  81 mg Oral q morning - 10a  .  ceFAZolin (ANCEF) IV  2 g Intravenous On Call to OR  . clopidogrel  75 mg Oral Daily  . docusate sodium  100 mg Oral BID  . doxazosin  1 mg Oral QHS  . feeding supplement (NEPRO CARB STEADY)  237 mL Oral Q1500  . furosemide  160 mg Intravenous TID  . heparin  5,000 Units Subcutaneous 3 times per day  . hydrALAZINE  100 mg Oral 3 times per day  . insulin aspart  0-9 Units Subcutaneous TID WC  . insulin detemir  10 Units Subcutaneous Daily  . multivitamin  1 tablet Oral QHS  . pantoprazole  20 mg Oral Daily  . pravastatin  20 mg Oral Daily  . pregabalin  75 mg Oral Daily  . sodium chloride  3 mL Intravenous Q12H   Continuous Infusions:   Time spent: 25 minutes  Aaron Long  [facility] [email or web address],  password [facility] 04/19/2014, 9:36 AM  LOS: 2 days

## 2014-04-19 NOTE — Care Management Note (Signed)
CARE MANAGEMENT NOTE 04/19/2014  Patient:  Aaron Long, Aaron Long   Account Number:  [id number]  Date Initiated:  04/19/2014  Documentation initiated by:  Emeril Stille  Subjective/Objective Assessment:   CM following for progression and d/c planning.     Action/Plan:   Met with pt wife, IM given, will continue to follow for possible new start HD.   Anticipated DC Date:  04/24/2014   Anticipated DC Plan:  HOME/SELF CARE         Choice offered to / List presented to:             Status of service:  In process, will continue to follow Medicare Important Message given?  YES (If response is "NO", the following Medicare IM given date fields will be blank) Date Medicare IM given:  04/19/2014 Medicare IM given by:  Geron Mulford Date Additional Medicare IM given:   Additional Medicare IM given by:    Discharge Disposition:    Per UR Regulation:    If discussed at Long Length of Stay Meetings, dates discussed:    Comments:

## 2014-04-19 NOTE — Progress Notes (Signed)
Pt is having some constipation. Pt and daughter request stool softener. Page to Tylene Fantasia for notification. Dorthey Sawyer, RN

## 2014-04-19 NOTE — H&P (View-Only) (Signed)
   Daily Progress Note  Per Nephrology's request, will place [facility] tomorrow.  If venogram available, will place access also.  Otherwise, will aim for Monday.  Adele Barthel, MD [facility] Office: [phone] Pager: [phone]  04/18/2014, 10:19 AM

## 2014-04-19 NOTE — Op Note (Signed)
OPERATIVE REPORT  Date of Surgery: 04/17/2014 - 04/19/2014  Surgeon: [name], MD  Assistant: Gerri Lins PA  Pre-op Diagnosis: End Stage Renal Disease N18.6  Post-op Diagnosis: End Stage Renal Disease N18.6  Procedure: Procedure(s): INSERTION OF DIALYSIS CATHETER--right internal jugular-with ultrasound guidance-19 cm Creation right radial-cephalic AV fistula  Anesthesia: LMA  EBL: Minimal  Complications: None  Procedure Details: The patient was taken the operative placed in supine position at which time satisfactory general-LMA anesthesia was administered. Upper chest was exposed both internal jugular veins were imaged using the mode ultrasound both noted to be widely patent. After prepping and draping in routine sterile manner the right internal jugular vein was entered using a supraclavicular approach guidewire passed into the right atrium under fluoroscopic guidance. After dilating the track appropriately a 19 cm tunneled hemodialysis catheter was positioned in the right atrium tunneled peripherally and secured with nylon sutures. Wound was closed with Vicryl in a subcuticular fashion sterile dressings applied take attention turned to the right upper extremity. The cephalic vein and radial artery had been marked just proximal to the wrist. Incision was made longitudinally between these vessels and the cephalic vein was dissected free distally ligated transected gently dilated with heparinized saline marked for orientation purposes. It was excellent vein being at least 3 mm in size. Following this the radial artery was exposed beneath the fascia. It was diffusely calcified but was of good caliber and had strong pulse palpable. Artery was then occluded proximally and distally with vascular clamps open 15 blade extended with Potts scissors. It would accept a 2 and half millimeters dilator and did have good inflow. The vein was carefully measured spatulated and anastomosed inside with 6-0  Prolene. Clamps then released after dilating the vessel once again proximally and distally with a 2-1/2 mm dilator the anastomosis completed there was a good pulse and thrill in the fistula with good flow up to the antecubital area. There was slight diminution of radial and ulnar flow with the fistula open which improved with compression of the fistula. Adequate hemostasis was achieved wound closed in layers with 5-0 subcuticular fashion sterile dressing applied patient taken to recovery room in satisfactory condition for chest x-ray   [name], MD 04/19/2014 3:08 PM

## 2014-04-19 NOTE — Progress Notes (Signed)
Pt BP 159/57 previously 276 systollic.  Called OR spoke with OR Nurse who spoke with Dr.Lawson.  T/O Ok to give Norvasc 10 mg po with sip of water.  Pt to have HD cath placed and graft placed in right arm.

## 2014-04-20 LAB — GLUCOSE, CAPILLARY
GLUCOSE-CAPILLARY: 175 mg/dL — AB (ref 70–99)
Glucose-Capillary: 302 mg/dL — ABNORMAL HIGH (ref 70–99)
Glucose-Capillary: 153 mg/dL — ABNORMAL HIGH (ref 70–99)

## 2014-04-20 LAB — BASIC METABOLIC PANEL
Anion gap: 11 (ref 5–15)
BUN: 55 mg/dL — AB (ref 6–23)
CO2: 26 mmol/L (ref 19–32)
Calcium: 8.3 mg/dL — ABNORMAL LOW (ref 8.4–10.5)
Chloride: 99 mmol/L (ref 96–112)
Creatinine, Ser: 6.85 mg/dL — ABNORMAL HIGH (ref 0.50–1.35)
GFR calc Af Amer: 8 mL/min — ABNORMAL LOW (ref >90)
GFR calc non Af Amer: 7 mL/min — ABNORMAL LOW (ref >90)
Glucose, Bld: 296 mg/dL — ABNORMAL HIGH (ref 70–99)
POTASSIUM: 4.1 mmol/L (ref 3.5–5.1)
SODIUM: 136 mmol/L (ref 135–145)

## 2014-04-20 LAB — HEPATITIS B SURFACE ANTIGEN: Hepatitis B Surface Ag: NEGATIVE

## 2014-04-20 LAB — CBC
HCT: 27.4 % — ABNORMAL LOW (ref 39.0–52.0)
Hemoglobin: 8.5 g/dL — ABNORMAL LOW (ref 13.0–17.0)
MCH: 29.2 pg (ref 26.0–34.0)
MCHC: 31 g/dL (ref 30.0–36.0)
MCV: 94.2 fL (ref 78.0–100.0)
PLATELETS: 148 10*3/uL — AB (ref 150–400)
RBC: 2.91 MIL/uL — ABNORMAL LOW (ref 4.22–5.81)
RDW: 15 % (ref 11.5–15.5)
WBC: 9.9 10*3/uL (ref 4.0–10.5)

## 2014-04-20 MED ORDER — LORAZEPAM 0.5 MG PO TABS
0.5000 mg | ORAL_TABLET | Freq: Once | ORAL | Status: AC
  Administered 2014-04-20: 0.5 mg via ORAL
  Filled 2014-04-20: qty 1

## 2014-04-20 NOTE — Progress Notes (Signed)
[facility] PROGRESS NOTE  Aaron Long GDJ:[id number] DOB: [birth year] DOA: 04/17/2014 PCP: [name], NP  Assessment/Plan:    Hyperkalemia: Corrected    Syncope:  Patient was bradycardic on admission. May have been related. Metoprolol stopped. Heart rate currently in the 60s. Continue telemetry.  end-stage renal disease: Getting access tomorrow. HD per nephro- will need CLIPPED    CAD (coronary artery disease):  Troponin trend flat. EKG without ischemic changes. No chest pain.    PAD (peripheral artery disease), bilateral amputee    Diabetes mellitus with peripheral vascular disease:  CBGs reasonable on lower dose of Levemir.     Status post bilateral below knee amputation    Uncontrolled hypertension   Code Status:  full Family Communication:   Family at bedside Disposition Plan:  home  Consultants:   nephrology  Vascular surgery   Procedures:     Antibiotics:    HPI/Subjective: No SOB, no CP  Objective: Filed Vitals:   04/20/14 0953  BP: 155/55  Pulse: 72  Temp: 98 F (36.7 C)  Resp: 18    Intake/Output Summary (Last 24 hours) at 04/20/14 1007 Last data filed at 04/20/14 0825  Gross per 24 hour  Intake    250 ml  Output   1179 ml  Net   -929 ml   Filed Weights   04/18/14 2100 04/19/14 1846 04/19/14 2051  Weight: 94.5 kg (208 lb 5.4 oz) 92.3 kg (203 lb 7.8 oz) 91.4 kg (201 lb 8 oz)     Exam:   General:  Awake, NAD   Cardiovascular:  regular rate rhythm without murmurs gallops rubs   Respiratory:  clear to auscultation bilaterally without wheeze rhonchi or rales   Abdomen:  soft nontender nondistended   Ext:  no edema. Bilateral BKA's.   Basic Metabolic Panel:  Recent Labs Lab 04/17/14 0700 04/17/14 0730 04/17/14 0806 04/17/14 1724 04/18/14 0637 04/19/14 0850  NA 136 137 139 140 139 140  K 6.5* 6.9* 6.8* 5.5* 4.9 3.8  CL 105 106 110 108 106 103  CO2 20 23  --  25 21 29   GLUCOSE 227* 228* 226* 157* 171*  186*  BUN 77* 77* 75* 74* 76* 78*  CREATININE 7.13* 7.12* 6.80* 6.94* 7.16* 7.37*  CALCIUM 8.9 9.0  --  8.7 8.5 8.3*  PHOS  --   --   --   --  5.6* 6.3*   Liver Function Tests:  Recent Labs Lab 04/17/14 0700 04/18/14 0637 04/19/14 0850  AST 29  --   --   ALT 38  --   --   ALKPHOS 121*  --   --   BILITOT 0.5  --   --   PROT 6.4  --   --   ALBUMIN 3.6 3.4* 3.1*    Recent Labs Lab 04/17/14 0700  LIPASE 50   No results for input(s): AMMONIA in the last 168 hours. CBC:  Recent Labs Lab 04/17/14 0700 04/17/14 0806 04/18/14 0637  WBC 7.8  --  7.0  HGB 10.5* 11.9* 9.1*  HCT 34.4* 35.0* 29.4*  MCV 95.3  --  95.8  PLT 181  --  153   Cardiac Enzymes:  Recent Labs Lab 04/17/14 1202 04/17/14 1724  TROPONINI 0.20* 0.28*   BNP (last 3 results) No results for input(s): BNP in the last 8760 hours.  ProBNP (last 3 results) No results for input(s): PROBNP in the last 8760 hours.  CBG:  Recent Labs Lab 04/19/14 1122 04/19/14  1525 04/19/14 1642 04/19/14 2131 04/20/14 0820  GLUCAP 139* 158* 184* 227* 302*    No results found for this or any previous visit (from the past 240 hour(s)).   Studies: Dg Chest Port 1 View  04/19/2014   CLINICAL DATA:  Hemodialysis catheter insertion.  EXAM: PORTABLE CHEST - 1 VIEW  COMPARISON:  04/17/2014 and prior chest radiographs.  FINDINGS: Mild cardiomegaly and CABG changes again noted.  A right IJ central venous catheter is identified with tip overlying the mid- lower SVC.  Interstitial pulmonary edema has decreased.  There is no evidence of pneumothorax or pleural effusion.  IMPRESSION: Right IJ central venous catheter with tip overlying the mid-lower SVC. No evidence of pneumothorax.  Decreased interstitial pulmonary edema.   Electronically Signed   By: Margarette Canada M.D.   On: 04/19/2014 21:17   Dg Fluoro Guide Cv Line-no Report  04/19/2014   CLINICAL DATA:    FLOURO GUIDE CV LINE  Fluoroscopy was utilized by the requesting physician.   No radiographic  interpretation.     Scheduled Meds: . amLODipine  10 mg Oral Daily  . aspirin EC  81 mg Oral q morning - 10a  . clopidogrel  75 mg Oral Daily  . darbepoetin (ARANESP) injection - DIALYSIS  60 mcg Intravenous Q Fri-HD  . docusate sodium  100 mg Oral BID  . doxazosin  1 mg Oral QHS  . doxercalciferol  2 mcg Intravenous Q M,W,F-HD  . feeding supplement (NEPRO CARB STEADY)  237 mL Oral Q1500  . heparin  5,000 Units Subcutaneous 3 times per day  . hydrALAZINE  100 mg Oral 3 times per day  . insulin aspart  0-9 Units Subcutaneous TID WC  . insulin detemir  10 Units Subcutaneous Daily  . multivitamin  1 tablet Oral QHS  . pantoprazole  20 mg Oral Daily  . pravastatin  20 mg Oral Daily  . pregabalin  75 mg Oral Daily  . sodium chloride  3 mL Intravenous Q12H   Continuous Infusions: . sodium chloride 10 mL/hr at 04/19/14 1228    Time spent: 25 minutes  VANN, JESSICA  [facility] [email or web address], password [facility] 04/20/2014, 10:07 AM  LOS: 3 days

## 2014-04-20 NOTE — Progress Notes (Addendum)
  [facility] Progress Note  04/20/2014 9:39 AM 1 Day Post-Op  Subjective:  Pain at incision. Denies hand pain. Tolerated HD well yesterday. Feels better.    Filed Vitals:   04/20/14 0549  BP: 119/48  Pulse: 59  Temp: 99.6 F (37.6 C)  Resp: 18    Physical Exam: Right IJ TDC site clean. Audible bruit right radial cephalic AVF. Grip strength 5/5. Sensation intact in digits.   CBC    Component Value Date/Time   WBC 7.0 04/18/2014 0637   WBC 6.2 04/02/2014 1540   RBC 3.07* 04/18/2014 0637   RBC 3.89* 04/02/2014 1540   HGB 9.1* 04/18/2014 0637   HCT 29.4* 04/18/2014 0637   PLT 153 04/18/2014 0637   MCV 95.8 04/18/2014 0637   MCH 29.6 04/18/2014 0637   MCH 28.3 04/02/2014 1540   MCHC 31.0 04/18/2014 0637   MCHC 30.6* 04/02/2014 1540   RDW 14.9 04/18/2014 0637   RDW 14.9 04/02/2014 1540   LYMPHSABS 2.0 04/02/2014 1540   LYMPHSABS 2.0 04/20/2013 1610   MONOABS 0.6 04/20/2013 1610   EOSABS 0.2 04/02/2014 1540   EOSABS 0.2 04/20/2013 1610   BASOSABS 0.1 04/02/2014 1540   BASOSABS 0.0 04/20/2013 1610    BMET    Component Value Date/Time   NA 140 04/19/2014 0850   NA 144 04/02/2014 1540   K 3.8 04/19/2014 0850   CL 103 04/19/2014 0850   CO2 29 04/19/2014 0850   GLUCOSE 186* 04/19/2014 0850   GLUCOSE 61* 04/02/2014 1540   BUN 78* 04/19/2014 0850   BUN 45* 04/02/2014 1540   CREATININE 7.37* 04/19/2014 0850   CALCIUM 8.3* 04/19/2014 0850   GFRNONAA 6* 04/19/2014 0850   GFRAA 7* 04/19/2014 0850    INR    Component Value Date/Time   INR 1.08 06/19/2012 1200     Intake/Output Summary (Last 24 hours) at 04/20/14 0939 Last data filed at 04/19/14 2051  Gross per 24 hour  Intake    250 ml  Output   1029 ml  Net   -779 ml     Assessment:  74 y.o. male is s/p: insertion of right IJ tunneled dialysis catheter and creation of right radial-cephalic AV fistula 1 Day Post-Op  Plan: -TDC used for HD without complications. -No steal symptoms.  Fistula is patent.  -Will have patient follow up with Dr. Kellie Simmering in 6 weeks. -Will sign off. Please call if questions.    Virgina Jock, PA-C [facility] Office: [phone] Pager: [phone] 04/20/2014 9:39 AM  Agree with above. Fistula has a good thrill. Incision looks fine. We will be available as needed.  Deitra Mayo, MD, [place] [phone] Office: [phone]

## 2014-04-20 NOTE — Progress Notes (Signed)
S:  Tolerated HD well last night O:BP 119/48 mmHg  Pulse 59  Temp(Src) 99.6 F (37.6 C) (Oral)  Resp 18  Ht 5\' 8"  (1.727 m)  Wt 91.4 kg (201 lb 8 oz)  BMI 30.65 kg/m2  SpO2 100%  Intake/Output Summary (Last 24 hours) at 04/20/14 1761 Last data filed at 04/19/14 2051  Gross per 24 hour  Intake    250 ml  Output   1029 ml  Net   -779 ml   Weight change: -2.2 kg (-4 lb 13.6 oz) YWV:PXTGG and alert CVS:RRR no rub Resp: Decreased BS bases Abd: + BS NTND Ext: bil BKA  Rt AVF + bruit NEURO:CNI Ox3 + asterixis Rt IJ permcath   . amLODipine  10 mg Oral Daily  . aspirin EC  81 mg Oral q morning - 10a  . clopidogrel  75 mg Oral Daily  . darbepoetin (ARANESP) injection - DIALYSIS  60 mcg Intravenous Q Fri-HD  . docusate sodium  100 mg Oral BID  . doxazosin  1 mg Oral QHS  . doxercalciferol  2 mcg Intravenous Q M,W,F-HD  . feeding supplement (NEPRO CARB STEADY)  237 mL Oral Q1500  . heparin  5,000 Units Subcutaneous 3 times per day  . hydrALAZINE  100 mg Oral 3 times per day  . insulin aspart  0-9 Units Subcutaneous TID WC  . insulin detemir  10 Units Subcutaneous Daily  . multivitamin  1 tablet Oral QHS  . pantoprazole  20 mg Oral Daily  . pravastatin  20 mg Oral Daily  . pregabalin  75 mg Oral Daily  . sodium chloride  3 mL Intravenous Q12H   Dg Chest Port 1 View  04/19/2014   CLINICAL DATA:  Hemodialysis catheter insertion.  EXAM: PORTABLE CHEST - 1 VIEW  COMPARISON:  04/17/2014 and prior chest radiographs.  FINDINGS: Mild cardiomegaly and CABG changes again noted.  A right IJ central venous catheter is identified with tip overlying the mid- lower SVC.  Interstitial pulmonary edema has decreased.  There is no evidence of pneumothorax or pleural effusion.  IMPRESSION: Right IJ central venous catheter with tip overlying the mid-lower SVC. No evidence of pneumothorax.  Decreased interstitial pulmonary edema.   Electronically Signed   By: Margarette Canada M.D.   On: 04/19/2014 21:17   Dg  Fluoro Guide Cv Line-no Report  04/19/2014   CLINICAL DATA:    FLOURO GUIDE CV LINE  Fluoroscopy was utilized by the requesting physician.  No radiographic  interpretation.    BMET    Component Value Date/Time   NA 140 04/19/2014 0850   NA 144 04/02/2014 1540   K 3.8 04/19/2014 0850   CL 103 04/19/2014 0850   CO2 29 04/19/2014 0850   GLUCOSE 186* 04/19/2014 0850   GLUCOSE 61* 04/02/2014 1540   BUN 78* 04/19/2014 0850   BUN 45* 04/02/2014 1540   CREATININE 7.37* 04/19/2014 0850   CALCIUM 8.3* 04/19/2014 0850   GFRNONAA 6* 04/19/2014 0850   GFRAA 7* 04/19/2014 0850   CBC    Component Value Date/Time   WBC 7.0 04/18/2014 0637   WBC 6.2 04/02/2014 1540   RBC 3.07* 04/18/2014 0637   RBC 3.89* 04/02/2014 1540   HGB 9.1* 04/18/2014 0637   HCT 29.4* 04/18/2014 0637   PLT 153 04/18/2014 0637   MCV 95.8 04/18/2014 0637   MCH 29.6 04/18/2014 0637   MCH 28.3 04/02/2014 1540   MCHC 31.0 04/18/2014 0637   MCHC 30.6* 04/02/2014 1540  RDW 14.9 04/18/2014 0637   RDW 14.9 04/02/2014 1540   LYMPHSABS 2.0 04/02/2014 1540   LYMPHSABS 2.0 04/20/2013 1610   MONOABS 0.6 04/20/2013 1610   EOSABS 0.2 04/02/2014 1540   EOSABS 0.2 04/20/2013 1610   BASOSABS 0.1 04/02/2014 1540   BASOSABS 0.0 04/20/2013 1610     Assessment: 1.  New ESRD.  2. Hyperkalemia, resolved 3. DM 4. Sec HPTH   PTH 455 on hectorol 5. Anemia on aranesp  Plan: 1.HD today 2. Working on [employer] T

## 2014-04-21 DIAGNOSIS — Z89511 Acquired absence of right leg below knee: Secondary | ICD-10-CM

## 2014-04-21 DIAGNOSIS — Z89512 Acquired absence of left leg below knee: Secondary | ICD-10-CM

## 2014-04-21 LAB — GLUCOSE, CAPILLARY
GLUCOSE-CAPILLARY: 180 mg/dL — AB (ref 70–99)
Glucose-Capillary: 188 mg/dL — ABNORMAL HIGH (ref 70–99)
Glucose-Capillary: 169 mg/dL — ABNORMAL HIGH (ref 70–99)

## 2014-04-21 LAB — HEPATITIS B SURFACE ANTIBODY,QUALITATIVE: HEP B S AB: NONREACTIVE

## 2014-04-21 LAB — HEPATITIS B CORE ANTIBODY, TOTAL: HEP B C TOTAL AB: NEGATIVE

## 2014-04-21 MED ORDER — LORAZEPAM 0.5 MG PO TABS
0.5000 mg | ORAL_TABLET | ORAL | Status: DC | PRN
  Administered 2014-04-21: 0.5 mg via ORAL
  Filled 2014-04-21 (×2): qty 1

## 2014-04-21 MED ORDER — INSULIN ASPART 100 UNIT/ML ~~LOC~~ SOLN
3.0000 [IU] | Freq: Three times a day (TID) | SUBCUTANEOUS | Status: DC
  Administered 2014-04-21 – 2014-04-25 (×10): 3 [IU] via SUBCUTANEOUS

## 2014-04-21 MED ORDER — INSULIN DETEMIR 100 UNIT/ML ~~LOC~~ SOLN
15.0000 [IU] | Freq: Every day | SUBCUTANEOUS | Status: DC
  Administered 2014-04-22 – 2014-04-25 (×4): 15 [IU] via SUBCUTANEOUS
  Filled 2014-04-21 (×4): qty 0.15

## 2014-04-21 NOTE — Progress Notes (Signed)
Patient restless and agitated since HD yesterday.  Patient was given 0.5 mg PO Ativan at 10 pm.  RN called to the patient's room by wife d/t patient again appearing restless and agitated.  MD notified.  Will continue to monitor.

## 2014-04-21 NOTE — Progress Notes (Signed)
S:  Eating better O:BP 149/52 mmHg  Pulse 71  Temp(Src) 99.5 F (37.5 C) (Oral)  Resp 18  Ht 5\' 8"  (1.727 m)  Wt 91.4 kg (201 lb 8 oz)  BMI 30.65 kg/m2  SpO2 98%  Intake/Output Summary (Last 24 hours) at 04/21/14 0835 Last data filed at 04/21/14 0600  Gross per 24 hour  Intake    360 ml  Output    225 ml  Net    135 ml   Weight change:  AYT:KZSWF and alert CVS:RRR no rub Resp: Decreased BS bases Abd: + BS NTND Ext: bil BKA  Rt AVF + bruit NEURO:CNI Ox3 + asterixis Rt IJ permcath   . amLODipine  10 mg Oral Daily  . aspirin EC  81 mg Oral q morning - 10a  . clopidogrel  75 mg Oral Daily  . darbepoetin (ARANESP) injection - DIALYSIS  60 mcg Intravenous Q Fri-HD  . docusate sodium  100 mg Oral BID  . doxazosin  1 mg Oral QHS  . doxercalciferol  2 mcg Intravenous Q M,W,F-HD  . feeding supplement (NEPRO CARB STEADY)  237 mL Oral Q1500  . heparin  5,000 Units Subcutaneous 3 times per day  . hydrALAZINE  100 mg Oral 3 times per day  . insulin aspart  0-9 Units Subcutaneous TID WC  . insulin detemir  10 Units Subcutaneous Daily  . multivitamin  1 tablet Oral QHS  . pantoprazole  20 mg Oral Daily  . pravastatin  20 mg Oral Daily  . pregabalin  75 mg Oral Daily  . sodium chloride  3 mL Intravenous Q12H   Dg Chest Port 1 View  04/19/2014   CLINICAL DATA:  Hemodialysis catheter insertion.  EXAM: PORTABLE CHEST - 1 VIEW  COMPARISON:  04/17/2014 and prior chest radiographs.  FINDINGS: Mild cardiomegaly and CABG changes again noted.  A right IJ central venous catheter is identified with tip overlying the mid- lower SVC.  Interstitial pulmonary edema has decreased.  There is no evidence of pneumothorax or pleural effusion.  IMPRESSION: Right IJ central venous catheter with tip overlying the mid-lower SVC. No evidence of pneumothorax.  Decreased interstitial pulmonary edema.   Electronically Signed   By: Margarette Canada M.D.   On: 04/19/2014 21:17   Dg Fluoro Guide Cv Line-no  Report  04/19/2014   CLINICAL DATA:    FLOURO GUIDE CV LINE  Fluoroscopy was utilized by the requesting physician.  No radiographic  interpretation.    BMET    Component Value Date/Time   NA 136 04/20/2014 1138   NA 144 04/02/2014 1540   K 4.1 04/20/2014 1138   CL 99 04/20/2014 1138   CO2 26 04/20/2014 1138   GLUCOSE 296* 04/20/2014 1138   GLUCOSE 61* 04/02/2014 1540   BUN 55* 04/20/2014 1138   BUN 45* 04/02/2014 1540   CREATININE 6.85* 04/20/2014 1138   CALCIUM 8.3* 04/20/2014 1138   GFRNONAA 7* 04/20/2014 1138   GFRAA 8* 04/20/2014 1138   CBC    Component Value Date/Time   WBC 9.9 04/20/2014 1138   WBC 6.2 04/02/2014 1540   RBC 2.91* 04/20/2014 1138   RBC 3.89* 04/02/2014 1540   HGB 8.5* 04/20/2014 1138   HCT 27.4* 04/20/2014 1138   PLT 148* 04/20/2014 1138   MCV 94.2 04/20/2014 1138   MCH 29.2 04/20/2014 1138   MCH 28.3 04/02/2014 1540   MCHC 31.0 04/20/2014 1138   MCHC 30.6* 04/02/2014 1540   RDW 15.0 04/20/2014 1138  RDW 14.9 04/02/2014 1540   LYMPHSABS 2.0 04/02/2014 1540   LYMPHSABS 2.0 04/20/2013 1610   MONOABS 0.6 04/20/2013 1610   EOSABS 0.2 04/02/2014 1540   EOSABS 0.2 04/20/2013 1610   BASOSABS 0.1 04/02/2014 1540   BASOSABS 0.0 04/20/2013 1610     Assessment: 1.  New ESRD.  2. Hyperkalemia, resolved 3. DM 4. Sec HPTH   PTH 455 on hectorol 5. Anemia on aranesp  Plan: 1.HD tomorrow.  Check labs with dialysis 2. Working on outpt spot .  SW will need to make sure transportation is set up. Neita Landrigan T

## 2014-04-21 NOTE — Progress Notes (Signed)
04/21/2014 7:35 PM  Patient very confused and continues to remove cardiac monitor. Patient has been d/c'd per order. Will continue to assess and monitor the patient throughout the night.  [employer], RN-BC, [employer] [facility] [address]  Phone [phone]

## 2014-04-21 NOTE — Progress Notes (Signed)
[facility] PROGRESS NOTE  Aaron Long NLZ:[id number] DOB: 07-11-1940 DOA: 04/17/2014 PCP: [name], NP  Assessment/Plan:    Hyperkalemia: Corrected    Syncope:  Patient was bradycardic on admission. May have been related. Metoprolol stopped. Heart rate currently in the 60s. Continue telemetry.  end-stage renal disease: s/p access by vascular, HD per nephro- needs CLIPPED    CAD (coronary artery disease):  Troponin trend flat. EKG without ischemic changes. No chest pain.    PAD (peripheral artery disease), bilateral amputee    Diabetes mellitus with peripheral vascular disease:  CBGs reasonable on lower dose of Levemir.     Status post bilateral below knee amputation    Uncontrolled hypertension   Code Status:  full Family Communication:   Family at bedside (sleeping today) Disposition Plan:  home  Consultants:   nephrology  Vascular surgery   Procedures:     Antibiotics:    HPI/Subjective: Patient agitated overnight per nursing Currently sleeping soundly after ativan  Objective: Filed Vitals:   04/21/14 0608  BP: 149/52  Pulse: 71  Temp: 99.5 F (37.5 C)  Resp: 18    Intake/Output Summary (Last 24 hours) at 04/21/14 0853 Last data filed at 04/21/14 0600  Gross per 24 hour  Intake    360 ml  Output    225 ml  Net    135 ml   Filed Weights   04/18/14 2100 04/19/14 1846 04/19/14 2051  Weight: 94.5 kg (208 lb 5.4 oz) 92.3 kg (203 lb 7.8 oz) 91.4 kg (201 lb 8 oz)     Exam:   General:  sleeping  Cardiovascular:  rrr   Respiratory:  Clear, no wheezing  Abdomen:  soft nontender nondistended   Ext:  no edema. Bilateral BKA's.   Basic Metabolic Panel:  Recent Labs Lab 04/17/14 0730 04/17/14 0806 04/17/14 1724 04/18/14 0637 04/19/14 0850 04/20/14 1138  NA 137 139 140 139 140 136  K 6.9* 6.8* 5.5* 4.9 3.8 4.1  CL 106 110 108 106 103 99  CO2 23  --  25 21 29 26   GLUCOSE 228* 226* 157* 171* 186* 296*  BUN 77*  75* 74* 76* 78* 55*  CREATININE 7.12* 6.80* 6.94* 7.16* 7.37* 6.85*  CALCIUM 9.0  --  8.7 8.5 8.3* 8.3*  PHOS  --   --   --  5.6* 6.3*  --    Liver Function Tests:  Recent Labs Lab 04/17/14 0700 04/18/14 0637 04/19/14 0850  AST 29  --   --   ALT 38  --   --   ALKPHOS 121*  --   --   BILITOT 0.5  --   --   PROT 6.4  --   --   ALBUMIN 3.6 3.4* 3.1*    Recent Labs Lab 04/17/14 0700  LIPASE 50   No results for input(s): AMMONIA in the last 168 hours. CBC:  Recent Labs Lab 04/17/14 0700 04/17/14 0806 04/18/14 0637 04/20/14 1138  WBC 7.8  --  7.0 9.9  HGB 10.5* 11.9* 9.1* 8.5*  HCT 34.4* 35.0* 29.4* 27.4*  MCV 95.3  --  95.8 94.2  PLT 181  --  153 148*   Cardiac Enzymes:  Recent Labs Lab 04/17/14 1202 04/17/14 1724  TROPONINI 0.20* 0.28*   BNP (last 3 results) No results for input(s): BNP in the last 8760 hours.  ProBNP (last 3 results) No results for input(s): PROBNP in the last 8760 hours.  CBG:  Recent Labs Lab 04/19/14  2131 04/20/14 0820 04/20/14 1627 04/20/14 2159 04/21/14 0737  GLUCAP 227* 302* 153* 175* 180*    No results found for this or any previous visit (from the past 240 hour(s)).   Studies: Dg Chest Port 1 View  04/19/2014   CLINICAL DATA:  Hemodialysis catheter insertion.  EXAM: PORTABLE CHEST - 1 VIEW  COMPARISON:  04/17/2014 and prior chest radiographs.  FINDINGS: Mild cardiomegaly and CABG changes again noted.  A right IJ central venous catheter is identified with tip overlying the mid- lower SVC.  Interstitial pulmonary edema has decreased.  There is no evidence of pneumothorax or pleural effusion.  IMPRESSION: Right IJ central venous catheter with tip overlying the mid-lower SVC. No evidence of pneumothorax.  Decreased interstitial pulmonary edema.   Electronically Signed   By: Margarette Canada M.D.   On: 04/19/2014 21:17   Dg Fluoro Guide Cv Line-no Report  04/19/2014   CLINICAL DATA:    FLOURO GUIDE CV LINE  Fluoroscopy was utilized by  the requesting physician.  No radiographic  interpretation.     Scheduled Meds: . amLODipine  10 mg Oral Daily  . aspirin EC  81 mg Oral q morning - 10a  . clopidogrel  75 mg Oral Daily  . darbepoetin (ARANESP) injection - DIALYSIS  60 mcg Intravenous Q Fri-HD  . docusate sodium  100 mg Oral BID  . doxazosin  1 mg Oral QHS  . doxercalciferol  2 mcg Intravenous Q M,W,F-HD  . feeding supplement (NEPRO CARB STEADY)  237 mL Oral Q1500  . heparin  5,000 Units Subcutaneous 3 times per day  . hydrALAZINE  100 mg Oral 3 times per day  . insulin aspart  0-9 Units Subcutaneous TID WC  . insulin detemir  10 Units Subcutaneous Daily  . multivitamin  1 tablet Oral QHS  . pantoprazole  20 mg Oral Daily  . pravastatin  20 mg Oral Daily  . pregabalin  75 mg Oral Daily  . sodium chloride  3 mL Intravenous Q12H   Continuous Infusions: . sodium chloride 10 mL/hr at 04/19/14 1228    Time spent: 15 minutes  Varshini Arrants  [facility] [email or web address], password [facility] 04/21/2014, 8:53 AM  LOS: 4 days

## 2014-04-21 NOTE — Progress Notes (Signed)
04/21/2014 9:09 PM  Patient refusing to take any of his medications tonight. Will continue to assess and monitor the patient throughout the night.  [employer], RN-BC, [employer] [facility] [address] Phone [phone]

## 2014-04-21 NOTE — Progress Notes (Signed)
Utilization review completed.  

## 2014-04-22 ENCOUNTER — Ambulatory Visit: Payer: Medicare Other | Admitting: Physical Therapy

## 2014-04-22 ENCOUNTER — Encounter (HOSPITAL_COMMUNITY)
Admission: EM | Disposition: A | Discharge status: Rehabilitation Facility | Payer: Self-pay | Source: Home / Self Care | Attending: Internal Medicine

## 2014-04-22 DIAGNOSIS — N186 End stage renal disease: Secondary | ICD-10-CM

## 2014-04-22 DIAGNOSIS — Z992 Dependence on renal dialysis: Secondary | ICD-10-CM

## 2014-04-22 LAB — RENAL FUNCTION PANEL
ANION GAP: 7 (ref 5–15)
Albumin: 2.9 g/dL — ABNORMAL LOW (ref 3.5–5.2)
BUN: 50 mg/dL — ABNORMAL HIGH (ref 6–23)
CALCIUM: 8.8 mg/dL (ref 8.4–10.5)
CO2: 30 mmol/L (ref 19–32)
Chloride: 100 mmol/L (ref 96–112)
Creatinine, Ser: 7.3 mg/dL — ABNORMAL HIGH (ref 0.50–1.35)
GFR, EST AFRICAN AMERICAN: 8 mL/min — AB (ref >90)
GFR, EST NON AFRICAN AMERICAN: 7 mL/min — AB (ref >90)
Glucose, Bld: 148 mg/dL — ABNORMAL HIGH (ref 70–99)
Phosphorus: 5.2 mg/dL — ABNORMAL HIGH (ref 2.3–4.6)
Potassium: 4 mmol/L (ref 3.5–5.1)
SODIUM: 137 mmol/L (ref 135–145)

## 2014-04-22 LAB — CBC
HCT: 25.4 % — ABNORMAL LOW (ref 39.0–52.0)
Hemoglobin: 8.1 g/dL — ABNORMAL LOW (ref 13.0–17.0)
MCH: 29.2 pg (ref 26.0–34.0)
MCHC: 31.9 g/dL (ref 30.0–36.0)
MCV: 91.7 fL (ref 78.0–100.0)
PLATELETS: 161 10*3/uL (ref 150–400)
RBC: 2.77 MIL/uL — ABNORMAL LOW (ref 4.22–5.81)
RDW: 14.7 % (ref 11.5–15.5)
WBC: 8.7 10*3/uL (ref 4.0–10.5)

## 2014-04-22 LAB — GLUCOSE, CAPILLARY
GLUCOSE-CAPILLARY: 159 mg/dL — AB (ref 70–99)
GLUCOSE-CAPILLARY: 158 mg/dL — AB (ref 70–99)
GLUCOSE-CAPILLARY: 170 mg/dL — AB (ref 70–99)

## 2014-04-22 SURGERY — ARTERIOVENOUS (AV) FISTULA CREATION
Anesthesia: Monitor Anesthesia Care | Laterality: Right

## 2014-04-22 MED ORDER — POLYETHYLENE GLYCOL 3350 17 G PO PACK
17.0000 g | PACK | Freq: Every day | ORAL | Status: DC
  Administered 2014-04-22 – 2014-04-25 (×4): 17 g via ORAL
  Filled 2014-04-22 (×4): qty 1

## 2014-04-22 MED ORDER — QUETIAPINE FUMARATE 25 MG PO TABS
25.0000 mg | ORAL_TABLET | Freq: Every day | ORAL | Status: DC
  Administered 2014-04-22 – 2014-04-25 (×4): 25 mg via ORAL
  Filled 2014-04-22 (×4): qty 1

## 2014-04-22 MED ORDER — DOXERCALCIFEROL 4 MCG/2ML IV SOLN
INTRAVENOUS | Status: AC
  Filled 2014-04-22: qty 2

## 2014-04-22 NOTE — Evaluation (Signed)
Physical Therapy Evaluation Patient Details Name: Aaron Long MRN: [id number] DOB: 01-21-1940 Today's Date: 04/22/2014   History of Present Illness  Takao Lizer is a 74 y.o. male, with a past medical history significant for chronic kidney disease stage IV, bilateral BKA's, type 2 diabetes, coronary artery disease, hypertension, and hyperlipidemia who presented to the emergency department for chest tightness. Work up revealed creatinine level of 4.69. Per nephrology, HD is now considered a necessity. Pt is s/p R UE AV fistuala creation    Clinical Impression  Pt admitted with above diagnosis. Pt currently with functional limitations due to the deficits listed below (see PT Problem List). Pt will benefit from skilled PT to increase their independence and safety with mobility to allow discharge to the venue listed below.    Patient has been to CIR with good results, is motivated, has been working on gait training in outpatient PT prior to admission. Recommend CIR for dc plan to maximize independence and safety with mobility.    Follow Up Recommendations CIR    Equipment Recommendations  Other (comment) (consider motorized wheelchair)    Recommendations for Other Services OT consult     Precautions / Restrictions Precautions Precautions: Fall Restrictions Weight Bearing Restrictions: No Other Position/Activity Restrictions: New AV fistula in R UE      Mobility  Bed Mobility Overal bed mobility: Needs Assistance Bed Mobility: Rolling;Sidelying to Sit;Sit to Supine Rolling: Mod assist Sidelying to sit: Mod assist   Sit to supine: Min assist   General bed mobility comments: hesistant to roll. physical assistance to right shoulder to acheive left sidelying. tactile cues to push through L elbow to transition from sidelying to sit. Physical assistance givien to trunk to achieve upright and to pelvis to swing legs to clear edge of bed. Pt opted to lay back down after doing  seated exercises, turned to get his legs back on the bed with ease and needed min assist at upper back to transfer to supine.  Next, transition from supine to prone with mod to max assist and verbal cues to move UEs to get to prone. Used bed pad to aid in return to supine after therex  Transfers                    Ambulation/Gait                Stairs            Wheelchair Mobility    Modified Rankin (Stroke Patients Only)       Balance Overall balance assessment: Needs assistance Sitting-balance support: Single extremity supported;Bilateral upper extremity supported Sitting balance-Leahy Scale: Fair Sitting balance - Comments: sat EOB for 5 min seated therex, tended to sit in posterior pelvic tilt                                     Pertinent Vitals/Pain Pain Assessment: No/denies pain (soreness noted at fistula site)    Home Living Family/patient expects to be discharged to:: Private residence Living Arrangements: Spouse/significant other;Other relatives Available Help at Discharge: Family;Other (Comment) (not sure reliability of family help) Type of Home: Apartment Home Access: Level entry     Home Layout: One level Home Equipment: Wheelchair - [employer] - 2 wheels;Other (comment) (sliding board for car transfers)      Prior Function Level of Independence: Needs assistance   Gait / Transfers Assistance Needed:  Currently seeing Outpatient PT for gait/prothesis training     Comments: was able to transfer independently with wheelchair transfers     Hand Dominance        Extremity/Trunk Assessment   Upper Extremity Assessment: RUE deficits/detail RUE Deficits / Details: decreased use of R UE during bed mobility post-fistula creation          Lower Extremity Assessment: Generalized weakness  Symmetrical Bilateral hip flexor weakness noted 4/5. Bilateral hamstring tightness noted. Pt able to take moderate resistance to  knee extension.  While lying prone, hip extension close to neutral.       Cervical / Trunk Assessment: Kyphotic  Communication      Cognition Arousal/Alertness: Awake/alert (though extremely fatigued post-HD) Behavior During Therapy: WFL for tasks assessed/performed;Flat affect Overall Cognitive Status: Within Functional Limits for tasks assessed (slow to answer questions at times)                      General Comments      Exercises Other Exercises Other Exercises: prone hip extension. 5 x BIL, AROM      Assessment/Plan    PT Assessment Patient needs continued PT services  PT Diagnosis Generalized weakness;Difficulty walking;Acute pain   PT Problem List Decreased strength;Decreased activity tolerance;Decreased range of motion;Decreased mobility;Decreased balance;Decreased coordination;Decreased knowledge of use of DME;Pain  PT Treatment Interventions Gait training;DME instruction;Functional mobility training;Therapeutic exercise;Balance training;Patient/family education;Wheelchair mobility training;Therapeutic activities   PT Goals (Current goals can be found in the Care Plan section) Acute Rehab PT Goals Patient Stated Goal: did not state PT Goal Formulation: With patient Time For Goal Achievement: 05/06/14 Potential to Achieve Goals: Good    Frequency Min 3X/week   Barriers to discharge Decreased caregiver support will need to verify pt has reliable support at home    Co-evaluation               End of Session Equipment Utilized During Treatment: Other (comment) (bed pad for transfers) Activity Tolerance: Patient limited by fatigue;Patient tolerated treatment well Patient left: in bed;with call bell/phone within reach;with bed alarm set;with family/visitor present Nurse Communication: Mobility status         Time: 2355-7322 PT Time Calculation (min) (ACUTE ONLY): 31 min   Charges:         PT G Codes:        Makaylie Dedeaux, Rachael 04/27/2014, 5:08  PM   [place] SPT  Acute Rehabilitation Services Office: [phone]

## 2014-04-22 NOTE — Progress Notes (Addendum)
[facility] PROGRESS NOTE  DEO MEHRINGER NLG:[id number] DOB: 07-24-1940 DOA: 04/17/2014 PCP: [name], NP  Assessment/Plan:    Hyperkalemia: Corrected    Syncope:  Patient was bradycardic on admission. May have been related. Metoprolol stopped. Heart rate currently in the 60s. Continue telemetry.  end-stage renal disease: s/p access by vascular, HD per nephro- needs CLIPPED    CAD (coronary artery disease):  Troponin trend flat. EKG without ischemic changes. No chest pain.    PAD (peripheral artery disease), bilateral amputee    Diabetes mellitus with peripheral vascular disease:  CBGs reasonable on lower dose of Levemir.     Status post bilateral below knee amputation    Uncontrolled hypertension  AMS- patient gets confused in the evenings- will give schedule seroqeul at 6PM   Code Status:  full Family Communication:   daughter at bedside Disposition Plan:  home  Consultants:   nephrology  Vascular surgery   Procedures:     Antibiotics:    HPI/Subjective: Patient became agitated last PM again- required mits  Objective: Filed Vitals:   04/22/14 1003  BP: 190/70  Pulse: 69  Temp: 97.4 F (36.3 C)  Resp: 20    Intake/Output Summary (Last 24 hours) at 04/22/14 1059 Last data filed at 04/22/14 1003  Gross per 24 hour  Intake    480 ml  Output   2850 ml  Net  -2370 ml   Filed Weights   04/19/14 2051 04/22/14 0703 04/22/14 1003  Weight: 91.4 kg (201 lb 8 oz) 89.8 kg (197 lb 15.6 oz) 87.7 kg (193 lb 5.5 oz)     Exam:   General:  Awake, no confusion  Cardiovascular:  rrr   Respiratory:  Clear, no wheezing  Abdomen:  soft nontender nondistended   Ext:  no edema. Bilateral BKA's.   Basic Metabolic Panel:  Recent Labs Lab 04/17/14 1724 04/18/14 0637 04/19/14 0850 04/20/14 1138 04/22/14 0719  NA 140 139 140 136 137  K 5.5* 4.9 3.8 4.1 4.0  CL 108 106 103 99 100  CO2 25 21 29 26 30   GLUCOSE 157* 171* 186* 296* 148*   BUN 74* 76* 78* 55* 50*  CREATININE 6.94* 7.16* 7.37* 6.85* 7.30*  CALCIUM 8.7 8.5 8.3* 8.3* 8.8  PHOS  --  5.6* 6.3*  --  5.2*   Liver Function Tests:  Recent Labs Lab 04/17/14 0700 04/18/14 0637 04/19/14 0850 04/22/14 0719  AST 29  --   --   --   ALT 38  --   --   --   ALKPHOS 121*  --   --   --   BILITOT 0.5  --   --   --   PROT 6.4  --   --   --   ALBUMIN 3.6 3.4* 3.1* 2.9*    Recent Labs Lab 04/17/14 0700  LIPASE 50   No results for input(s): AMMONIA in the last 168 hours. CBC:  Recent Labs Lab 04/17/14 0700 04/17/14 0806 04/18/14 0637 04/20/14 1138 04/22/14 0719  WBC 7.8  --  7.0 9.9 8.7  HGB 10.5* 11.9* 9.1* 8.5* 8.1*  HCT 34.4* 35.0* 29.4* 27.4* 25.4*  MCV 95.3  --  95.8 94.2 91.7  PLT 181  --  153 148* 161   Cardiac Enzymes:  Recent Labs Lab 04/17/14 1202 04/17/14 1724  TROPONINI 0.20* 0.28*   BNP (last 3 results) No results for input(s): BNP in the last 8760 hours.  ProBNP (last 3 results) No results  for input(s): PROBNP in the last 8760 hours.  CBG:  Recent Labs Lab 04/20/14 1627 04/20/14 2159 04/21/14 0737 04/21/14 1152 04/21/14 1629  GLUCAP 153* 175* 180* 188* 169*    No results found for this or any previous visit (from the past 240 hour(s)).   Studies: No results found.  Scheduled Meds: . amLODipine  10 mg Oral Daily  . aspirin EC  81 mg Oral q morning - 10a  . clopidogrel  75 mg Oral Daily  . darbepoetin (ARANESP) injection - DIALYSIS  60 mcg Intravenous Q Fri-HD  . docusate sodium  100 mg Oral BID  . doxazosin  1 mg Oral QHS  . doxercalciferol      . doxercalciferol  2 mcg Intravenous Q M,W,F-HD  . feeding supplement (NEPRO CARB STEADY)  237 mL Oral Q1500  . heparin  5,000 Units Subcutaneous 3 times per day  . hydrALAZINE  100 mg Oral 3 times per day  . insulin aspart  0-9 Units Subcutaneous TID WC  . insulin aspart  3 Units Subcutaneous TID WC  . insulin detemir  15 Units Subcutaneous Daily  . multivitamin  1  tablet Oral QHS  . pantoprazole  20 mg Oral Daily  . pravastatin  20 mg Oral Daily  . pregabalin  75 mg Oral Daily  . QUEtiapine  25 mg Oral Daily  . sodium chloride  3 mL Intravenous Q12H   Continuous Infusions: . sodium chloride 10 mL/hr at 04/19/14 1228    Time spent: 15 minutes  Edmund Rick  [facility] [email or web address], password [facility] 04/22/2014, 10:59 AM  LOS: 5 days

## 2014-04-22 NOTE — Progress Notes (Signed)
04/22/2014 1:02 AM  Called to room by NT that patient had blood all over the bed. Assessed patient and he had removed his IV catheter from left wrist. Cleaned wrist and applied dressing. Still assessing the patient noticed that he has also removed his dressing from his left chest HD catheter as well. Informed IV team. Placed small sterile dressing over HD access until IV Team RN arrives. Placed mittens on patient as well. Will continue to assess and monitor the patient throughout the night.   [employer], RN-BC,RN3 [employer] [address] Phone [phone]

## 2014-04-22 NOTE — Anesthesia Postprocedure Evaluation (Signed)
  Anesthesia Post-op Note  Patient: Aaron Long  Procedure(s) Performed: Procedure(s): INSERTION OF DIALYSIS CATHETER-INTERNAL JUGULAR (Right) RIGHT ARM ARTERIOVENOUS (AV) FISTULA CREATION (Right)  Patient Location: PACU  Anesthesia Type:General  Level of Consciousness: awake  Airway and Oxygen Therapy: Patient Spontanous Breathing  Post-op Pain: none  Post-op Assessment: Post-op Vital signs reviewed, Patient's Cardiovascular Status Stable, Respiratory Function Stable, Patent Airway, No signs of Nausea or vomiting and Pain level controlled  Post-op Vital Signs: Reviewed and stable  Last Vitals:  Filed Vitals:   04/22/14 0830  BP: 188/69  Pulse: 68  Temp:   Resp:     Complications: No apparent anesthesia complications

## 2014-04-22 NOTE — Progress Notes (Signed)
PT Cancellation Note  Patient Details Name: Aaron Long MRN: [id number] DOB: 11/18/1940   Cancelled Treatment:    Reason Eval/Treat Not Completed: Patient at procedure or test/unavailable   Will re-attempt PT session later today otherwise will follow up tomorrow.  Derrill Center, SPT Acute Rehabilitation Services Office: [phone]   Derrill Center 04/22/2014, 9:47 AM

## 2014-04-22 NOTE — Procedures (Signed)
Tolerating hemodialysis.  Has AVF right Cimino with thrill. PC right chest   Assessment: 1. New ESRD.   Plan: 1.HD tomorrow. Arranging OP schedule & transportaion .Aaron Long C

## 2014-04-23 ENCOUNTER — Encounter (HOSPITAL_COMMUNITY): Payer: Self-pay | Admitting: Vascular Surgery

## 2014-04-23 ENCOUNTER — Telehealth: Payer: Self-pay | Admitting: Vascular Surgery

## 2014-04-23 DIAGNOSIS — R5381 Other malaise: Secondary | ICD-10-CM

## 2014-04-23 LAB — GLUCOSE, CAPILLARY
GLUCOSE-CAPILLARY: 165 mg/dL — AB (ref 70–99)
Glucose-Capillary: 113 mg/dL — ABNORMAL HIGH (ref 70–99)
Glucose-Capillary: 157 mg/dL — ABNORMAL HIGH (ref 70–99)
Glucose-Capillary: 128 mg/dL — ABNORMAL HIGH (ref 70–99)

## 2014-04-23 NOTE — Consult Note (Signed)
Physical Medicine and Rehabilitation Consult  Reason for Consult: Deconditioning due to progression to ESRD Referring Physician: Dr. Eliseo Squires   HPI: Aaron Long is a 74 y.o. male with history of HTN, CAD, CKD, B-BKA, chronic pain; who has been reluctant to get HD in the past. He was admitted on 04/17/14 with syncope, tremors and recurrent hyperkalemia. Patient with Scr 7.1 and K-6.5. He was treated with  Kayexalate, insulin and lasix and was agreeable to start to HD.  Dialysis catheter and AV fistula created on 04/08 by Dr. Kellie Simmering and HD initiated. Patient has had improvement in energy levels as well as po intake but continues to have confusion at nights. Therapy evaluation done and patient has had decrease in RUE due to pain from fistula. Patient was undergoing  outpatient therapy for prosthetic training and ambulating ~ 66' with min/guard assist PTA. CIR recommended by MD/PT.     Review of Systems  Respiratory: Negative for shortness of breath.   Cardiovascular: Negative for chest pain.      Past Medical History  Diagnosis Date  . PAD (peripheral artery disease)     a. R CEA 2007. b. Hx L BKA in 2013. c. s/p LE angioplasty in 2014. d. Hx R BKA in 2014.  . Gangrene of toe     dry  . Neuromuscular disorder     diabetic neuropathy  . Hypertension   . Hyperlipidemia   . Peripheral neuropathy   . Constipation     takes Miralax daily  . Hemorrhoids   . Hx of colonic polyps   . Coronary artery disease     a. s/p NSTEMI/CABGx4 in 2007 - LIMA-LAD, SVG-optional diagonal, SVVG-OM, SVG-dRCA. b. Nuc 05/2011: nonischemic.  [name] BPH (benign prostatic hypertrophy)   . Abnormality of gait   . DVT (deep venous thrombosis)     a. Lower extremity DVT in 2013.  [name] Hypertrophy of prostate without urinary obstruction and other lower urinary tract symptoms (LUTS)   . Disorder of bone and cartilage, unspecified   . Lower limb amputation, below knee   . Anemia   . Secondary hyperparathyroidism      Secondary Hyperpara- Thyroidism, Renal  . Ischemic cardiomyopathy     a. EF 40% in 2007. b. 51% by nuc 05/2011.  [name] Complication of anesthesia     "he gets delirious"  . Myocardial infarction 2005  . Type II diabetes mellitus   . Diabetic peripheral neuropathy   . ESRD (end stage renal disease)     "not ready yet for dialysis" (04/17/2014)  . Kidney stones   . Prostate cancer 2010    Past Surgical History  Procedure Laterality Date  . Prostatectomy  2009  . Knee cartilage surgery Left 1964  . Coronary artery bypass graft  2005    CABG X4  . Carotid endarterectomy Right 2005  . Cataract extraction w/ intraocular lens  implant, bilateral Bilateral 2011  . Colonoscopy    . Cardiac catheterization  05/28/11  . Amputation  06/14/2011    Procedure: AMPUTATION DIGIT;  Surgeon: Elam Dutch, MD;  Location: [facility] OR;  Service: Vascular;  Laterality: Left;  Amputation Left fifth toe  . Amputation  06/16/2011    Procedure: AMPUTATION BELOW KNEE;  Surgeon: Elam Dutch, MD;  Location: [place];  Service: Vascular;  Laterality: Left;  . I&d extremity  01/31/2012    Procedure: IRRIGATION AND DEBRIDEMENT EXTREMITY;  Surgeon: Elam Dutch, MD;  Location: [place];  Service: Vascular;  Laterality: Left;  I & D Left BKA   . Amputation Right 06/12/2012    Procedure: AMPUTATION DIGIT;  Surgeon: Elam Dutch, MD;  Location: [facility] OR;  Service: Vascular;  Laterality: Right;  GREAT TOE  . Amputation Right 08/08/2012    Procedure: AMPUTATION BELOW KNEE;  Surgeon: Elam Dutch, MD;  Location: [facility] OR;  Service: Vascular;  Laterality: Right;  . Abdominal aortagram N/A 05/28/2011    Procedure: ABDOMINAL Maxcine Ham;  Surgeon: Elam Dutch, MD;  Location: [facility] CATH LAB;  Service: Cardiovascular;  Laterality: N/A;  . Abdominal aortagram N/A 06/02/2012    Procedure: ABDOMINAL Maxcine Ham;  Surgeon: Elam Dutch, MD;  Location: [facility] CATH LAB;  Service: Cardiovascular;  Laterality: N/A;  . Abdominal aortagram N/A  06/09/2012    Procedure: ABDOMINAL Maxcine Ham;  Surgeon: Elam Dutch, MD;  Location: [facility] CATH LAB;  Service: Cardiovascular;  Laterality: N/A;    Family History  Problem Relation Age of Onset  . Coronary artery disease Neg Hx   . Anesthesia problems Neg Hx   . Hypotension Neg Hx   . Malignant hyperthermia Neg Hx   . Pseudochol deficiency Neg Hx   . Hyperlipidemia Mother   . Hypertension Mother   . Cancer Mother   . Hyperlipidemia Father   . Hypertension Father   . Kidney disease Father   . Heart disease Sister   . Alzheimer's disease Sister   . Cancer Brother   . Other Sister     Social History:  Married. Independent PTA. Wife works days. Per reports that he quit smoking about 23 years ago. His smoking use included Cigarettes. He has a 30 pack-year smoking history. He has never used smokeless tobacco. Per reports that he drinks alcohol. He reports that he does not use illicit drugs.    Allergies  Allergen Reactions  . Oxycodone Other (See Comments)    Hallucinations    Medications Prior to Admission  Medication Sig Dispense Refill  . AMBULATORY NON FORMULARY MEDICATION BD U/P Mini Pen [place] 31GX5MM Use three times daily as directed DX:250.00 100 each 11  . AMBULATORY NON FORMULARY MEDICATION One Touch Ultra 2 Test Strips Sig:  Check blood sugars Three times daily to keep blood sugars under control Dx: 250.00, 401.9 100 strip 11  . amLODipine (NORVASC) 10 MG tablet take 1 tablet by mouth once daily 90 tablet 1  . aspirin EC 81 MG tablet Take 1 tablet (81 mg total) by mouth every morning.    . clopidogrel (PLAVIX) 75 MG tablet take 1 tablet by mouth once daily 90 tablet 1  . docusate sodium (COLACE) 100 MG capsule Take 1 capsule (100 mg total) by mouth 2 (two) times daily. 10 capsule 0  . doxazosin (CARDURA) 1 MG tablet take 1 tablet by mouth at bedtime 90 tablet 1  . hydrALAZINE (APRESOLINE) 100 MG tablet Take 1 tablet (100 mg total) by mouth every 8 (eight) hours. 90  tablet 0  . HYDROcodone-acetaminophen (NORCO/VICODIN) 5-325 MG per tablet Take 1 tablet by mouth every 12 (twelve) hours as needed for moderate pain. 60 tablet 0  . insulin aspart (NOVOLOG) 100 UNIT/ML FlexPen Inject 5 Units into the skin 3 (three) times daily with meals. Inject 5 units three times a day with meals if blood sugar greater than 150 only, DX: 250.71    . Insulin Detemir (LEVEMIR) 100 UNIT/ML Pen Inject 22 Units into the skin at bedtime. 15 mL 3  . metoprolol succinate (TOPROL-XL)  25 MG 24 hr tablet Take 1 tablet (25 mg total) by mouth daily. 90 tablet 1  . Misc. Devices [facility] [place] Patient needs new Wheelchair due to his being broken. 1 each 0  . pantoprazole (PROTONIX) 20 MG tablet Take 1 tablet (20 mg total) by mouth daily. 90 tablet 1  . pravastatin (PRAVACHOL) 20 MG tablet Take 1 tablet (20 mg total) by mouth daily. 90 tablet 3  . pregabalin (LYRICA) 75 MG capsule Take 1 capsule (75 mg total) by mouth daily. (Patient taking differently: Take 75 mg by mouth 2 (two) times daily. ) 30 capsule 0  . sodium polystyrene (KAYEXALATE) 15 GM/60ML suspension Take 60 mLs (15 g total) by mouth 2 (two) times daily. One day (Patient not taking: Reported on 04/17/2014) 120 mL 0    Home: Home Living Family/patient expects to be discharged to:: Private residence Living Arrangements: Spouse/significant other, Other relatives Available Help at Discharge: Family, Other (Comment) (not sure reliability of family help) Type of Home: Apartment Home Access: Level entry Home Layout: One level Home Equipment: Wheelchair - manual, Environmental consultant - 2 wheels, Other (comment) (sliding board for car transfers)  Functional History: Prior Function Level of Independence: Needs assistance Gait / Transfers Assistance Needed: Currently seeing Outpatient PT for gait/prothesis training Comments: was able to transfer independently with wheelchair transfers Functional Status:  Mobility: Bed Mobility Overal bed  mobility: Needs Assistance Bed Mobility: Rolling, Sidelying to Sit, Sit to Supine Rolling: Mod assist Sidelying to sit: Mod assist Sit to supine: Min assist General bed mobility comments: hesistant to roll. physical assistance to right shoulder to acheive left sidelying. tactile cues to push through L elbow to transition from sidelying to sit. Physical assistance givien to trunk to achieve upright and to pelvis to swing legs to clear edge of bed. Pt opted to lay back down after doing seated exercises, turned to get his legs back on the bed with ease and needed min assist at upper back to transfer to supine.  Next, transition from supine to prone with mod to max assist and verbal cues to move UEs to get to prone. Used bed pad to aid in return to supine after therex        ADL:    Cognition: Cognition Overall Cognitive Status: Within Functional Limits for tasks assessed (slow to answer questions at times) Orientation Level: Oriented to person, Oriented to place, Disoriented to time, Disoriented to situation Cognition Arousal/Alertness: Awake/alert (though extremely fatigued post-HD) Behavior During Therapy: WFL for tasks assessed/performed, Flat affect Overall Cognitive Status: Within Functional Limits for tasks assessed (slow to answer questions at times)  Blood pressure 136/55, pulse 67, temperature 98.1 F (36.7 C), temperature source Oral, resp. rate 18, height 5\' 8"  (1.727 m), weight 87.635 kg (193 lb 3.2 oz), SpO2 96 %. Physical Exam  Nursing note and vitals reviewed. Constitutional: He is oriented to person, place, and time. He appears well-developed and well-nourished.  HENT:  Head: Normocephalic and atraumatic.  Eyes: Conjunctivae are normal. Pupils are equal, round, and reactive to light.  Neck: Normal range of motion. Neck supple.  Cardiovascular: Normal rate and regular rhythm.   Respiratory: Effort normal and breath sounds normal. No respiratory distress. He has no wheezes.   GI: Soft. Bowel sounds are normal. He exhibits no distension. There is no tenderness.  Musculoskeletal: He exhibits no edema or tenderness.  bk's well formed. Wearing shrinkers  Neurological: He is alert and oriented to person, place, and time.  UE 4- deltoid, 4/5  biceps, tricep, HI LE: 3/5 HF 4/5 ke. Some pain along left knee  Skin: Skin is warm and dry.  Psychiatric: His affect is blunt. He is withdrawn.    Results for orders placed or performed during the hospital encounter of 04/17/14 (from the past 24 hour(s))  Glucose, capillary     Status: Abnormal   Collection Time: 04/22/14 11:55 AM  Result Value Ref Range   Glucose-Capillary 159 (H) 70 - 99 mg/dL  Glucose, capillary     Status: Abnormal   Collection Time: 04/22/14  4:37 PM  Result Value Ref Range   Glucose-Capillary 158 (H) 70 - 99 mg/dL  Glucose, capillary     Status: Abnormal   Collection Time: 04/22/14  9:15 PM  Result Value Ref Range   Glucose-Capillary 170 (H) 70 - 99 mg/dL  Glucose, capillary     Status: Abnormal   Collection Time: 04/23/14  7:50 AM  Result Value Ref Range   Glucose-Capillary 113 (H) 70 - 99 mg/dL   No results found.  Assessment/Plan: Diagnosis: debility due to renal failure new HD in a patient with recent BKA's 1. Does the need for close, 24 hr/day medical supervision in concert with the patient's rehab needs make it unreasonable for this patient to be served in a less intensive setting? Yes 2. Co-Morbidities requiring supervision/potential complications: CAD, PAD, CKD 3. Due to bladder management, bowel management, safety, skin/wound care, disease management, medication administration, pain management and patient education, does the patient require 24 hr/day rehab nursing? Yes 4. Does the patient require coordinated care of a physician, rehab nurse, PT (1-2 hrs/day, 5 days/week) and OT (1-2 hrs/day, 5 days/week) to address physical and functional deficits in the context of the above medical  diagnosis(es)? Yes Addressing deficits in the following areas: balance, endurance, locomotion, strength, transferring, bowel/bladder control, bathing, dressing, feeding, grooming, toileting and psychosocial support 5. Can the patient actively participate in an intensive therapy program of at least 3 hrs of therapy per day at least 5 days per week? Yes 6. The potential for patient to make measurable gains while on inpatient rehab is excellent 7. Anticipated functional outcomes upon discharge from inpatient rehab are modified independent  with PT, modified independent with OT, n/a with SLP. 8. Estimated rehab length of stay to reach the above functional goals is: 7 days 9. Does the patient have adequate social supports and living environment to accommodate these discharge functional goals? Yes 10. Anticipated D/C setting: Home 11. Anticipated post D/C treatments: [place] therapy 12. Overall Rehab/Functional Prognosis: excellent  RECOMMENDATIONS: This patient's condition is appropriate for continued rehabilitative care in the following setting: CIR Patient has agreed to participate in recommended program. Yes Note that insurance prior authorization may be required for reimbursement for recommended care.  Comment: Rehab Admissions Coordinator to follow up.  Thanks,  Meredith Staggers, MD, Mellody Drown     04/23/2014

## 2014-04-23 NOTE — Progress Notes (Signed)
04/23/2014 11:34 AM Hemodialysis Outpatient Note; this patient has been accepted at the [facility] on a Tuesday, Thursday and Saturday 2nd shift schedule. The center will plan to begin treatment on Tuesday April 19 at 11:00. Admission can be accelerated if patient discharges earlier. Thank you. Gordy Savers

## 2014-04-23 NOTE — Progress Notes (Signed)
[facility] PROGRESS NOTE  Aaron Long YPP:[id number] DOB: 12/27/1940 DOA: 04/17/2014 PCP: Aaron Chandler, NP  Assessment/Plan:    Hyperkalemia: Corrected    Syncope:  Patient was bradycardic on admission. May have been related. Metoprolol stopped. Heart rate currently in the 60s. Continue telemetry.  end-stage renal disease: s/p access by vascular, HD per nephro- needs CLIPPED    CAD (coronary artery disease):  Troponin trend flat. EKG without ischemic changes. No chest pain.    PAD (peripheral artery disease), bilateral amputee    Diabetes mellitus with peripheral vascular disease:  CBGs reasonable on lower dose of Levemir.     Status post bilateral below knee amputation    Uncontrolled hypertension  AMS- patient gets confused in the evenings- will give schedule seroqeul at [facility]- much better night per nursing   Code Status:  full Family Communication:   daughter at bedside 4/11 Disposition Plan:  CIR?  Consultants:   nephrology  Vascular surgery   Procedures:     Antibiotics:    HPI/Subjective: Slept better last PM PT rec CIR  Objective: Filed Vitals:   04/23/14 0751  BP: 136/55  Pulse: 67  Temp: 98.1 F (36.7 C)  Resp: 18    Intake/Output Summary (Last 24 hours) at 04/23/14 1027 Last data filed at 04/23/14 0944  Gross per 24 hour  Intake    770 ml  Output      0 ml  Net    770 ml   Filed Weights   04/22/14 0703 04/22/14 1003 04/22/14 2114  Weight: 89.8 kg (197 lb 15.6 oz) 87.7 kg (193 lb 5.5 oz) 87.635 kg (193 lb 3.2 oz)     Exam:   General:  Awake, NAD  Cardiovascular:  rrr   Respiratory:  Clear, no wheezing  Abdomen:  soft nontender nondistended   Ext:  no edema. Bilateral BKA's.   Basic Metabolic Panel:  Recent Labs Lab 04/17/14 1724 04/18/14 0637 04/19/14 0850 04/20/14 1138 04/22/14 0719  NA 140 139 140 136 137  K 5.5* 4.9 3.8 4.1 4.0  CL 108 106 103 99 100  CO2 25 21 29 26 30   GLUCOSE 157* 171*  186* 296* 148*  BUN 74* 76* 78* 55* 50*  CREATININE 6.94* 7.16* 7.37* 6.85* 7.30*  CALCIUM 8.7 8.5 8.3* 8.3* 8.8  PHOS  --  5.6* 6.3*  --  5.2*   Liver Function Tests:  Recent Labs Lab 04/17/14 0700 04/18/14 0637 04/19/14 0850 04/22/14 0719  AST 29  --   --   --   ALT 38  --   --   --   ALKPHOS 121*  --   --   --   BILITOT 0.5  --   --   --   PROT 6.4  --   --   --   ALBUMIN 3.6 3.4* 3.1* 2.9*    Recent Labs Lab 04/17/14 0700  LIPASE 50   No results for input(s): AMMONIA in the last 168 hours. CBC:  Recent Labs Lab 04/17/14 0700 04/17/14 0806 04/18/14 0637 04/20/14 1138 04/22/14 0719  WBC 7.8  --  7.0 9.9 8.7  HGB 10.5* 11.9* 9.1* 8.5* 8.1*  HCT 34.4* 35.0* 29.4* 27.4* 25.4*  MCV 95.3  --  95.8 94.2 91.7  PLT 181  --  153 148* 161   Cardiac Enzymes:  Recent Labs Lab 04/17/14 1202 04/17/14 1724  TROPONINI 0.20* 0.28*   BNP (last 3 results) No results for input(s): BNP in the last  8760 hours.  ProBNP (last 3 results) No results for input(s): PROBNP in the last 8760 hours.  CBG:  Recent Labs Lab 04/21/14 1629 04/22/14 1155 04/22/14 1637 04/22/14 2115 04/23/14 0750  GLUCAP 169* 159* 158* 170* 113*    No results found for this or any previous visit (from the past 240 hour(s)).   Studies: No results found.  Scheduled Meds: . amLODipine  10 mg Oral Daily  . aspirin EC  81 mg Oral q morning - 10a  . clopidogrel  75 mg Oral Daily  . darbepoetin (ARANESP) injection - DIALYSIS  60 mcg Intravenous Q Fri-HD  . docusate sodium  100 mg Oral BID  . doxazosin  1 mg Oral QHS  . doxercalciferol  2 mcg Intravenous Q M,W,F-HD  . feeding supplement (NEPRO CARB STEADY)  237 mL Oral Q1500  . heparin  5,000 Units Subcutaneous 3 times per day  . hydrALAZINE  100 mg Oral 3 times per day  . insulin aspart  0-9 Units Subcutaneous TID WC  . insulin aspart  3 Units Subcutaneous TID WC  . insulin detemir  15 Units Subcutaneous Daily  . multivitamin  1 tablet  Oral QHS  . pantoprazole  20 mg Oral Daily  . polyethylene glycol  17 g Oral Daily  . pravastatin  20 mg Oral Daily  . pregabalin  75 mg Oral Daily  . QUEtiapine  25 mg Oral Daily  . sodium chloride  3 mL Intravenous Q12H   Continuous Infusions: . sodium chloride 10 mL/hr at 04/19/14 1228    Time spent: 15 minutes  Aaron Long  [facility] [email or web address], password [facility] 04/23/2014, 10:27 AM  LOS: 6 days

## 2014-04-23 NOTE — Telephone Encounter (Addendum)
-----   Message from Mena Goes, RN sent at 04/19/2014  4:00 PM EDT ----- Regarding: schedule   ----- Message -----    From: Ulyses Amor, PA-C    Sent: 04/19/2014   3:01 PM      To: [facility] Charge Pool  S/p Diatek placement and right AV fistula creation  F/U with Dr. Kellie Simmering for fistula duplex right forearm in 6 weeks.   04/23/14: LM for pt to call [facility]NA to schedule appt, dpm

## 2014-04-23 NOTE — Progress Notes (Addendum)
Inpatient Rehabilitation  I met the patient at the bedside for conversation on his post acute rehab needs.  I introduced him to IP rehab  and left him a booklet which he says he will share with his wife.  Pt. Appears to have a depressed affect and was tearful when he stated, "I take 2 steps forward and 5 steps back".  He does not believe he can get well and says he is not sure if he wants to.  I discussed this with pt's RN Josph Macho as well as with [employer]  I will reach out to the patient's wife for her input, however pt. does not appear to be onboard with CIR at this point.  I will follow up tomorrow.  Pt.'s admission to IP rehab would be dependent on his medical readiness and bed availability in addition to his preferences.  Please call if questions.  [place] Admissions Coordinator Cell [phone] Office [phone]   Addendum:  [phone] I had a phone conversation with Mrs. Laretta Alstrom, pt's wife.  She plans to talk with pt. When she arrives later today about his rehab options.  She strongly prefers pt. Come to IP rehab here at [facility].  I carefully explained the possibility that I may not have bed availability.  She stated that if no rehab bed available, she would prefer [facility] as pt. Has been there in the past prior to his LE amputations.  I updated Lorriane Shire Crawford,SW on the above information.  Wife can best be reached at her job by calling [phone].  Ask to speak with Berenda Morale and he can reach out call Mrs. Polhemus to the phone.  I will follow up tomorrow.   Gerlean Ren

## 2014-04-23 NOTE — Clinical Documentation Improvement (Signed)
MD's, NP's, and PA's  Noted documentation of "anemia" in patient with new dx of ESRD an h/o anemia due to chronic kidney dz.  If diagnosis is appropriate for this admit please document in notes. Thank you   Please clarify if the following diagnosis Anemia of Chronic Kidney Disease was:     [name] Present at the time of admission . NOT present at the time of inpatient admission and it developed during the inpatient stay . Unable to clinically determine whether the condition was present on admission. . Documentation insufficient to determine if condition was present at the time of inpatient admission      Thank You, Ree Kida ,RN Clinical Documentation Specialist:  [facility]

## 2014-04-23 NOTE — Progress Notes (Signed)
Rehab Admissions Coordinator Note:  Patient was screened by Retta Diones for appropriateness for an Inpatient Acute Rehab Consult.  At this time, an inpatient rehab consult is pending completion.  We will follow up once consult is completed.  Retta Diones 04/23/2014, 8:45 AM  I can be reached at [phone].

## 2014-04-23 NOTE — Progress Notes (Signed)
  Assessment: 1. New ESRD.  2. Hyperkalemia, resolved 3. DM 4. Sec HPTH PTH 455 on hectorol 5. Anemia on aranesp  Plan: 1.Has OP slot at [facility] on TTS at 11 2 HD in AM 3 Awaiting poss in pt rehab which would be beneficial  Subjective: Interval History: Tolerated HD yesterday  Objective: Vital signs in last 24 hours: Temp:  [97.5 F (36.4 C)-98.7 F (37.1 C)] 98.1 F (36.7 C) (04/12 0751) Pulse Rate:  [67-73] 67 (04/12 0751) Resp:  [18-20] 18 (04/12 0751) BP: (128-148)/(53-72) 136/55 mmHg (04/12 0751) SpO2:  [95 %-100 %] 96 % (04/12 0751) Weight:  [87.635 kg (193 lb 3.2 oz)] 87.635 kg (193 lb 3.2 oz) (04/11 2114) Weight change:   Intake/Output from previous day: 04/11 0701 - 04/12 0700 In: 530 [P.O.:530] Out: 2000  Intake/Output this shift: Total I/O In: 480 [P.O.:480] Out: 200 [Urine:200]  General appearance: alert and cooperative Resp: clear to auscultation bilaterally Chest wall: no tenderness Cardio: regular rate and rhythm, S1, S2 normal, no murmur, click, rub or gallop Extremities: bilat amputee  Lab Results:  Recent Labs  04/22/14 0719  WBC 8.7  HGB 8.1*  HCT 25.4*  PLT 161   BMET:  Recent Labs  04/22/14 0719  NA 137  K 4.0  CL 100  CO2 30  GLUCOSE 148*  BUN 50*  CREATININE 7.30*  CALCIUM 8.8   No results for input(s): PTH in the last 72 hours. Iron Studies: No results for input(s): IRON, TIBC, TRANSFERRIN, FERRITIN in the last 72 hours. Studies/Results: No results found.  Scheduled: . amLODipine  10 mg Oral Daily  . aspirin EC  81 mg Oral q morning - 10a  . clopidogrel  75 mg Oral Daily  . darbepoetin (ARANESP) injection - DIALYSIS  60 mcg Intravenous Q Fri-HD  . docusate sodium  100 mg Oral BID  . doxazosin  1 mg Oral QHS  . doxercalciferol  2 mcg Intravenous Q M,W,F-HD  . feeding supplement (NEPRO CARB STEADY)  237 mL Oral Q1500  . heparin  5,000 Units Subcutaneous 3 times per day  . hydrALAZINE  100 mg Oral 3 times per day   . insulin aspart  0-9 Units Subcutaneous TID WC  . insulin aspart  3 Units Subcutaneous TID WC  . insulin detemir  15 Units Subcutaneous Daily  . multivitamin  1 tablet Oral QHS  . pantoprazole  20 mg Oral Daily  . polyethylene glycol  17 g Oral Daily  . pravastatin  20 mg Oral Daily  . pregabalin  75 mg Oral Daily  . QUEtiapine  25 mg Oral Daily  . sodium chloride  3 mL Intravenous Q12H     LOS: 6 days   Acacia Latorre C 04/23/2014,2:13 PM

## 2014-04-23 NOTE — Progress Notes (Signed)
Physical Therapy Treatment Patient Details Name: Aaron Long MRN: [id number] DOB: 03/09/1940 Today's Date: 04/23/2014    History of Present Illness Aaron Long is a 74 y.o. male, with a past medical history significant for chronic kidney disease stage IV, bilateral BKA's, type 2 diabetes, coronary artery disease, hypertension, and hyperlipidemia who presented to the emergency department for chest tightness. Work up revealed creatinine level of 4.69. Per nephrology, HD is now considered a necessity. Pt is s/p R UE AV fistuala creation      PT Comments    Session focused on bed mobility and donning/doffing prosthesis. Noted more efficiency and smoothness in bed mobility. Discharge to CIR still is appropriate to address functional deficits and maximize independence and safety with mobility.   Introduced idea of pt standing using platform walker. Although hesitant today, plan for standing trials next session.     Follow Up Recommendations  CIR     Equipment Recommendations  Other (comment) (consider motorized wheelchair)    Recommendations for Other Services OT consult     Precautions / Restrictions Precautions Precautions: Fall Restrictions Weight Bearing Restrictions: No Other Position/Activity Restrictions: New AV fistula in R UE    Mobility  Bed Mobility Overal bed mobility: Needs Assistance Bed Mobility: Rolling;Sidelying to Sit Rolling: Min assist Sidelying to sit: Min assist   Sit to supine: Supervision   General bed mobility comments: decreased use of R UE due to pain. Assistance given to L shoulder girdle to get to sitting EOB. Modified assistance to don and doff neoprene prosthetic liner sitting EOB. pt was fatigued and hesitant to try sit to stand transfer after donning prosthetic liner. pt decided to end treatment session after sitting EOB and agreed to try standing tomorrow.    Transfers                    Ambulation/Gait                  Stairs            Wheelchair Mobility    Modified Rankin (Stroke Patients Only)       Balance Overall balance assessment: Modified Independent Sitting-balance support: Feet unsupported;No upper extremity supported Sitting balance-Leahy Scale: Fair Sitting balance - Comments: able to sit EOB for 10 minutes while donning prosthetic liner                             Cognition Arousal/Alertness: Awake/alert Behavior During Therapy: WFL for tasks assessed/performed Overall Cognitive Status: Within Functional Limits for tasks assessed (some confusion as to where he was at in the hospital)                      Exercises      General Comments General comments (skin integrity, edema, etc.): good skin integrity of residual limb.       Pertinent Vitals/Pain Pain Assessment: 0-10 Pain Score: 8  Pain Location: R UE Pain Descriptors / Indicators: Discomfort;Operative site guarding;Sore Pain Intervention(s): Monitored during session;Limited activity within patient's tolerance;Patient requesting pain meds-RN notified    Home Living                      Prior Function            PT Goals (current goals can now be found in the care plan section) Acute Rehab PT Goals Patient Stated Goal: decrease pain, get out  of bed and standing,getting out of the hospital PT Goal Formulation: With patient Time For Goal Achievement: 05/06/14 Potential to Achieve Goals: Good Progress towards PT goals: Progressing toward goals    Frequency  Min 3X/week    PT Plan Current plan remains appropriate    Co-evaluation             End of Session Equipment Utilized During Treatment: Other (comment) (BL shrinkers, BL neoprene prosthetic liners) Activity Tolerance: Patient limited by fatigue;Patient limited by pain Patient left: in bed;with call bell/phone within reach     Time: 1141-1215 PT Time Calculation (min) (ACUTE ONLY): 34 min  Charges:                        G Codes:      Aaron Long, Rachael 05-22-2014, 1:59 PM   Rachael Mays Paino, SPT Acute Rehabilitation Services Office: [phone]

## 2014-04-24 ENCOUNTER — Encounter: Payer: Medicare Other | Admitting: Physical Therapy

## 2014-04-24 LAB — RENAL FUNCTION PANEL
ANION GAP: 12 (ref 5–15)
Albumin: 3 g/dL — ABNORMAL LOW (ref 3.5–5.2)
BUN: 51 mg/dL — AB (ref 6–23)
CALCIUM: 9 mg/dL (ref 8.4–10.5)
CO2: 26 mmol/L (ref 19–32)
CREATININE: 6.91 mg/dL — AB (ref 0.50–1.35)
Chloride: 97 mmol/L (ref 96–112)
GFR calc non Af Amer: 7 mL/min — ABNORMAL LOW (ref >90)
GFR, EST AFRICAN AMERICAN: 8 mL/min — AB (ref >90)
Glucose, Bld: 204 mg/dL — ABNORMAL HIGH (ref 70–99)
POTASSIUM: 3.9 mmol/L (ref 3.5–5.1)
Phosphorus: 5.1 mg/dL — ABNORMAL HIGH (ref 2.3–4.6)
Sodium: 135 mmol/L (ref 135–145)

## 2014-04-24 LAB — GLUCOSE, CAPILLARY
GLUCOSE-CAPILLARY: 121 mg/dL — AB (ref 70–99)
GLUCOSE-CAPILLARY: 230 mg/dL — AB (ref 70–99)
Glucose-Capillary: 171 mg/dL — ABNORMAL HIGH (ref 70–99)
Glucose-Capillary: 128 mg/dL — ABNORMAL HIGH (ref 70–99)

## 2014-04-24 LAB — CBC
HCT: 28.4 % — ABNORMAL LOW (ref 39.0–52.0)
Hemoglobin: 9.1 g/dL — ABNORMAL LOW (ref 13.0–17.0)
MCH: 29 pg (ref 26.0–34.0)
MCHC: 32 g/dL (ref 30.0–36.0)
MCV: 90.4 fL (ref 78.0–100.0)
PLATELETS: 212 10*3/uL (ref 150–400)
RBC: 3.14 MIL/uL — AB (ref 4.22–5.81)
RDW: 14.8 % (ref 11.5–15.5)
WBC: 8.4 10*3/uL (ref 4.0–10.5)

## 2014-04-24 MED ORDER — SODIUM CHLORIDE 0.9 % IV SOLN: 100.0000 mL | INTRAVENOUS | Status: DC | PRN

## 2014-04-24 MED ORDER — NEPRO/CARBSTEADY PO LIQD
237.0000 mL | ORAL | Status: DC | PRN
  Filled 2014-04-24: qty 237

## 2014-04-24 MED ORDER — PENTAFLUOROPROP-TETRAFLUOROETH EX AERO: 1.0000 "application " | INHALATION_SPRAY | CUTANEOUS | Status: DC | PRN

## 2014-04-24 MED ORDER — HEPARIN SODIUM (PORCINE) 1000 UNIT/ML DIALYSIS: 20.0000 [IU]/kg | INTRAMUSCULAR | Status: DC | PRN

## 2014-04-24 MED ORDER — LIDOCAINE-PRILOCAINE 2.5-2.5 % EX CREA
1.0000 "application " | TOPICAL_CREAM | CUTANEOUS | Status: DC | PRN
  Filled 2014-04-24: qty 5

## 2014-04-24 MED ORDER — HEPARIN SODIUM (PORCINE) 1000 UNIT/ML DIALYSIS: 1000.0000 [IU] | INTRAMUSCULAR | Status: DC | PRN

## 2014-04-24 MED ORDER — LIDOCAINE HCL (PF) 1 % IJ SOLN: 5.0000 mL | INTRAMUSCULAR | Status: DC | PRN

## 2014-04-24 MED ORDER — ALTEPLASE 2 MG IJ SOLR
2.0000 mg | Freq: Once | INTRAMUSCULAR | Status: DC | PRN
  Filled 2014-04-24: qty 2

## 2014-04-24 MED ORDER — DOXERCALCIFEROL 4 MCG/2ML IV SOLN
INTRAVENOUS | Status: AC
  Filled 2014-04-24: qty 2

## 2014-04-24 NOTE — Progress Notes (Signed)
NUTRITION FOLLOW UP  DOCUMENTATION CODES Per approved criteria  -Obesity Unspecified   INTERVENTION: Continue Nepro Shake po once daily, each supplement provides 425 kcal and 19 grams protein.  Encourage adequate PO intake.  NUTRITION DIAGNOSIS: Increased nutrient needs related to chronic illness as evidenced by estimated nutrition needs; ongoing  Goal: Pt to meet >/= 90% of their estimated nutrition needs; met  Monitor:  PO intake, weight trends, labs, I/O's  74 y.o. male  Admitting Dx: Hyperkalemia  ASSESSMENT: Pt with CKD stage IV, hypertension, hyperlipidemia, coronary artery disease, peripheral vascular disease s/p bilateral BKA, diabetes, presents with syncope and hyperkalemia.  Procedure (4/8): INSERTION OF DIALYSIS CATHETER--right internal jugular-with ultrasound guidance-19 cm Creation right radial-cephalic AV fistula  New ESRD on HD. Meal completion has been varied from 25-100%. Pt reports his appetite is fine. Pt has been consuming his Nepro Shake. Will continue with current orders. Pt was encouraged to eat his food at meals and to drink his supplement. Current plans for CIR.  Labs: Low GFR. High BUN, creatinine, phosphorous (5.1).  Height: Ht Readings from Last 1 Encounters:  04/17/14 5' 8" (1.727 m)    Weight: Wt Readings from Last 1 Encounters:  04/24/14 193 lb 9 oz (87.8 kg)  04/17/14 207 lbs  Body mass index is 29.44 kg/(m^2).  Re-Estimated Nutritional Needs: Kcal: 2000-2300 Protein: 105-120 grams Fluid: 1.2 L/day  Skin: Incision on R neck, R chest, R arm  Diet Order: Diet renal with fluid restriction Fluid restriction:: 1200 mL Fluid; Room service appropriate?: Yes; Fluid consistency:: Thin   Intake/Output Summary (Last 24 hours) at 04/24/14 1518 Last data filed at 04/24/14 0938  Gross per 24 hour  Intake    240 ml  Output    560 ml  Net   -320 ml    Last BM: 4/8  Labs:   Recent Labs Lab 04/19/14 0850 04/20/14 1138  04/22/14 0719 04/24/14 1302  NA 140 136 137 135  K 3.8 4.1 4.0 3.9  CL 103 99 100 97  CO2 _0 BUN 78* 55* 50* 51*  CREATININE 7.37* 6.85* 7.30* 6.91*  CALCIUM 8.3* 8.3* 8.8 9.0  PHOS 6.3*  --  5.2* 5.1*  GLUCOSE 186* 296* 148* 204*    CBG (last 3)   Recent Labs  04/23/14 2121 04/24/14 0746 04/24/14 1132  GLUCAP 128* 121* 171*    Scheduled Meds: . amLODipine  10 mg Oral Daily  . aspirin EC  81 mg Oral q morning - 10a  . clopidogrel  75 mg Oral Daily  . darbepoetin (ARANESP) injection - DIALYSIS  60 mcg Intravenous Q Fri-HD  . docusate sodium  100 mg Oral BID  . doxazosin  1 mg Oral QHS  . doxercalciferol  2 mcg Intravenous Q M,W,F-HD  . feeding supplement (NEPRO CARB STEADY)  237 mL Oral Q1500  . heparin  5,000 Units Subcutaneous 3 times per day  . hydrALAZINE  100 mg Oral 3 times per day  . insulin aspart  0-9 Units Subcutaneous TID WC  . insulin aspart  3 Units Subcutaneous TID WC  . insulin detemir  15 Units Subcutaneous Daily  . multivitamin  1 tablet Oral QHS  . pantoprazole  20 mg Oral Daily  . polyethylene glycol  17 g Oral Daily  . pravastatin  20 mg Oral Daily  . pregabalin  75 mg Oral Daily  . QUEtiapine  25 mg Oral Daily  . sodium chloride  3 mL Intravenous Q12H  Continuous Infusions: . sodium chloride 10 mL/hr at 04/19/14 1228    Past Medical History  Diagnosis Date  . PAD (peripheral artery disease)     a. R CEA 2007. b. Hx L BKA in 2013. c. s/p LE angioplasty in 2014. d. Hx R BKA in 2014.  . Gangrene of toe     dry  . Neuromuscular disorder     diabetic neuropathy  . Hypertension   . Hyperlipidemia   . Peripheral neuropathy   . Constipation     takes Miralax daily  . Hemorrhoids   . Hx of colonic polyps   . Coronary artery disease     a. s/p NSTEMI/CABGx4 in 2007 - LIMA-LAD, SVG-optional diagonal, SVVG-OM, SVG-dRCA. b. Nuc 05/2011: nonischemic.  [name] BPH (benign prostatic hypertrophy)   . Abnormality of gait   . DVT (deep  venous thrombosis)     a. Lower extremity DVT in 2013.  [name] Hypertrophy of prostate without urinary obstruction and other lower urinary tract symptoms (LUTS)   . Disorder of bone and cartilage, unspecified   . Lower limb amputation, below knee   . Anemia   . Secondary hyperparathyroidism     Secondary Hyperpara- Thyroidism, Renal  . Ischemic cardiomyopathy     a. EF 40% in 2007. b. 51% by nuc 05/2011.  [name] Complication of anesthesia     "he gets delirious"  . Myocardial infarction 2005  . Type II diabetes mellitus   . Diabetic peripheral neuropathy   . ESRD (end stage renal disease)     "not ready yet for dialysis" (04/17/2014)  . Kidney stones   . Prostate cancer 2010    Past Surgical History  Procedure Laterality Date  . Prostatectomy  2009  . Knee cartilage surgery Left 1964  . Coronary artery bypass graft  2005    CABG X4  . Carotid endarterectomy Right 2005  . Cataract extraction w/ intraocular lens  implant, bilateral Bilateral 2011  . Colonoscopy    . Cardiac catheterization  05/28/11  . Amputation  06/14/2011    Procedure: AMPUTATION DIGIT;  Surgeon: Elam Dutch, MD;  Location[facility];  Service: Vascular;  Laterality: Left;  Amputation Left fifth toe  . Amputation  06/16/2011    Procedure: AMPUTATION BELOW KNEE;  Surgeon: Elam Dutch, MD;  Location: [place];  Service: Vascular;  Laterality: Left;  . I&d extremity  01/31/2012    Procedure: IRRIGATION AND DEBRIDEMENT EXTREMITY;  Surgeon: Elam Dutch, MD;  Location: [place];  Service: Vascular;  Laterality: Left;  I & D Left BKA   . Amputation Right 06/12/2012    Procedure: AMPUTATION DIGIT;  Surgeon: Elam Dutch, MD;  Location: [facility] OR;  Service: Vascular;  Laterality: Right;  GREAT TOE  . Amputation Right 08/08/2012    Procedure: AMPUTATION BELOW KNEE;  Surgeon: Elam Dutch, MD;  Location: [facility] OR;  Service: Vascular;  Laterality: Right;  . Abdominal aortagram N/A 05/28/2011    Procedure: ABDOMINAL Maxcine Ham;  Surgeon:  Elam Dutch, MD;  Location[facility] CATH LAB;  Service: Cardiovascular;  Laterality: N/A;  . Abdominal aortagram N/A 06/02/2012    Procedure: ABDOMINAL Maxcine Ham;  Surgeon: Elam Dutch, MD;  Location: [facility] CATH LAB;  Service: Cardiovascular;  Laterality: N/A;  . Abdominal aortagram N/A 06/09/2012    Procedure: ABDOMINAL Maxcine Ham;  Surgeon: Elam Dutch, MD;  Location: [facility] CATH LAB;  Service: Cardiovascular;  Laterality: N/A;  . Insertion of dialysis catheter Right 04/19/2014  Procedure: INSERTION OF DIALYSIS CATHETER-INTERNAL JUGULAR;  Surgeon: Mal Misty, MD;  Location: [place];  Service: Vascular;  Laterality: Right;  . Av fistula placement Right 04/19/2014    Procedure: RIGHT ARM ARTERIOVENOUS (AV) FISTULA CREATION;  Surgeon: Mal Misty, MD;  Location: [place];  Service: Vascular;  Laterality: Right;    Kallie Locks, MS, RD, LDN Pager # [phone] After hours/ weekend pager # [phone]

## 2014-04-24 NOTE — Care Management Note (Signed)
    Page 1 of 1   04/24/2014     11:36:19 AM CARE MANAGEMENT NOTE 04/24/2014  Patient:  Aaron Long, Aaron Long   Account Number:  [id number]  Date Initiated:  04/19/2014  Documentation initiated by:  ROYAL,CHERYL  Subjective/Objective Assessment:   CM following for progression and d/c planning.     Action/Plan:   Met with pt wife, IM given, will continue to follow for possible new start HD.   Anticipated DC Date:  04/24/2014   Anticipated DC Plan:  HOME/SELF CARE         Choice offered to / List presented to:             Status of service:  In process, will continue to follow Medicare Important Message given?  YES (If response is "NO", the following Medicare IM given date fields will be blank) Date Medicare IM given:  04/19/2014 Medicare IM given by:  ROYAL,CHERYL Date Additional Medicare IM given:  04/24/2014 Additional Medicare IM given by:  Kailash Hinze  Discharge Disposition:    Per UR Regulation:    If discussed at Long Length of Stay Meetings, dates discussed:    Comments:

## 2014-04-24 NOTE — Progress Notes (Signed)
[facility] PROGRESS NOTE  Aaron Long TWS:[id number] DOB: Feb 18, 1940 DOA: 04/17/2014 PCP: [name], NP  Assessment/Plan:    Hyperkalemia: Corrected    Syncope:  Patient was bradycardic on admission. May have been related. Metoprolol stopped. Heart rate currently in the 60s. Continue telemetry.  end-stage renal disease: s/p access by vascular, HD per nephro- needs CLIPPED    CAD (coronary artery disease):  Troponin trend flat. EKG without ischemic changes. No chest pain.    PAD (peripheral artery disease), bilateral amputee    Diabetes mellitus with peripheral vascular disease:  CBGs reasonable on lower dose of Levemir.     Status post bilateral below knee amputation    Uncontrolled hypertension, bp improving  AMS- patient gets confused in the evenings- will give schedule seroqeul at 6PM- much better night per nursing   Code Status:  full Family Communication:   patient Disposition Plan:  CIR?  Consultants:   nephrology  Vascular surgery   Procedures:   Right IJ dialysis catheter insertion 4/11  Right arm avf creation 4/11  HD  Antibiotics:  none  HPI/Subjective: Patient seen during HD, denies pain, no sob, no dizziness CIR placement pending  Objective: Filed Vitals:   04/24/14 2011  BP: 139/49  Pulse: 76  Temp: 98.4 F (36.9 C)  Resp: 16    Intake/Output Summary (Last 24 hours) at 04/24/14 2300 Last data filed at 04/24/14 1800  Gross per 24 hour  Intake    360 ml  Output   3260 ml  Net  -2900 ml   Filed Weights   04/23/14 2118 04/24/14 1215 04/24/14 1602  Weight: 88.179 kg (194 lb 6.4 oz) 87.8 kg (193 lb 9 oz) 83.9 kg (184 lb 15.5 oz)     Exam:   General:  Awake, NAD  Cardiovascular:  rrr   Respiratory:  Clear, no wheezing  Abdomen:  soft nontender nondistended   Ext:  no edema. Bilateral BKA's.   Basic Metabolic Panel:  Recent Labs Lab 04/18/14 0637 04/19/14 0850 04/20/14 1138 04/22/14 0719  04/24/14 1302  NA 139 140 136 137 135  K 4.9 3.8 4.1 4.0 3.9  CL 106 103 99 100 97  CO2 21 29 26 30 26   GLUCOSE 171* 186* 296* 148* 204*  BUN 76* 78* 55* 50* 51*  CREATININE 7.16* 7.37* 6.85* 7.30* 6.91*  CALCIUM 8.5 8.3* 8.3* 8.8 9.0  PHOS 5.6* 6.3*  --  5.2* 5.1*   Liver Function Tests:  Recent Labs Lab 04/18/14 0017 04/19/14 0850 04/22/14 0719 04/24/14 1302  ALBUMIN 3.4* 3.1* 2.9* 3.0*   No results for input(s): LIPASE, AMYLASE in the last 168 hours. No results for input(s): AMMONIA in the last 168 hours. CBC:  Recent Labs Lab 04/18/14 0637 04/20/14 1138 04/22/14 0719 04/24/14 1301  WBC 7.0 9.9 8.7 8.4  HGB 9.1* 8.5* 8.1* 9.1*  HCT 29.4* 27.4* 25.4* 28.4*  MCV 95.8 94.2 91.7 90.4  PLT 153 148* 161 212   Cardiac Enzymes: No results for input(s): CKTOTAL, CKMB, CKMBINDEX, TROPONINI in the last 168 hours. BNP (last 3 results) No results for input(s): BNP in the last 8760 hours.  ProBNP (last 3 results) No results for input(s): PROBNP in the last 8760 hours.  CBG:  Recent Labs Lab 04/23/14 2121 04/24/14 0746 04/24/14 1132 04/24/14 1649 04/24/14 2153  GLUCAP 128* 121* 171* 128* 230*    No results found for this or any previous visit (from the past 240 hour(s)).   Studies: No results found.  Scheduled Meds: . amLODipine  10 mg Oral Daily  . aspirin EC  81 mg Oral q morning - 10a  . clopidogrel  75 mg Oral Daily  . darbepoetin (ARANESP) injection - DIALYSIS  60 mcg Intravenous Q Fri-HD  . docusate sodium  100 mg Oral BID  . doxazosin  1 mg Oral QHS  . doxercalciferol      . doxercalciferol  2 mcg Intravenous Q M,W,F-HD  . feeding supplement (NEPRO CARB STEADY)  237 mL Oral Q1500  . heparin  5,000 Units Subcutaneous 3 times per day  . hydrALAZINE  100 mg Oral 3 times per day  . insulin aspart  0-9 Units Subcutaneous TID WC  . insulin aspart  3 Units Subcutaneous TID WC  . insulin detemir  15 Units Subcutaneous Daily  . multivitamin  1 tablet  Oral QHS  . pantoprazole  20 mg Oral Daily  . polyethylene glycol  17 g Oral Daily  . pravastatin  20 mg Oral Daily  . pregabalin  75 mg Oral Daily  . QUEtiapine  25 mg Oral Daily  . sodium chloride  3 mL Intravenous Q12H   Continuous Infusions: . sodium chloride 10 mL/hr at 04/19/14 1228    Time spent: 15 minutes  Shanasia Ibrahim MD PhD  [facility] [email or web address], password [facility] 04/24/2014, 11:00 PM  LOS: 7 days

## 2014-04-24 NOTE — Procedures (Signed)
Tolerating hemodialysis.  Tolerating fluid removal.  No hemodynamic instability. Shaquila Sigman C

## 2014-04-24 NOTE — Progress Notes (Signed)
Inpatient Rehabilitation   I spoke with Mrs. Amstutz over the phone this am and visited the patient briefly during HD this afternoon.  They both desire that pt. get his rehab here on CIR.  I await bed availability and medical readiness for possible admission to IP Rehab.  Danne Baxter [phone]) will follow up tomorrow in my absence.  I alerted Truett Mainland of possible CIR admission and we will follow up with her if admission is confirmed.  I updated [employer] CM and Crawford Givens SW of the above.  Please call if questions.    [place] Admissions Coordinator Cell [phone] Office [phone]

## 2014-04-24 NOTE — Progress Notes (Signed)
Physical Therapy Treatment Patient Details Name: Aaron Long MRN: [id number] DOB: [birth year] Today's Date: 04/24/2014    History of Present Illness Aaron Long is a 74 y.o. male, with a past medical history significant for chronic kidney disease stage IV, bilateral BKA's, type 2 diabetes, coronary artery disease, hypertension, and hyperlipidemia who presented to the emergency department for chest tightness. Work up revealed creatinine level of 4.69. Per nephrology, HD is now considered a necessity. Pt is s/p R UE AV fistuala creation      PT Comments    Session focused on bed mobility, donning and doffing prostheses, sit to stand transfers and increasing standing tolerance. Noted more willingness to get out of bed and stand compared to last session. Despite being painful, pt states "he needs to be standing again".  Discharge to CIR still recommended.  Discussed CIR with patient and educated on what to expect at [facility]. Pt willing to participate in further PT/OT services if he is accepted to CIR.   Follow Up Recommendations  CIR     Equipment Recommendations  Other (comment)    Recommendations for Other Services OT consult     Precautions / Restrictions Precautions Precautions: Fall Restrictions Weight Bearing Restrictions: No    Mobility  Bed Mobility Overal bed mobility: Needs Assistance Bed Mobility: Rolling;Sidelying to Sit Rolling: Min assist Sidelying to sit: Min assist   Sit to supine: Supervision   General bed mobility comments: Decreased use of R UE. provided assistance under right elbow for rolling into L sidelying and sidelying to sit.   Transfers Overall transfer level: Needs assistance Equipment used: Right platform walker;2 person hand held assist (BL prosthesis) Transfers: Sit to/from Stand Sit to Stand: Mod assist;+2 physical assistance         General transfer comment: Pt completed 3 sit to stand transfers using BL prosthesis. Level of  assistance decreased and time standing increased across the 3 sit to stand trials.  Elevating the bed height helped pt stand. pt started to feel pain in his BL LEs during standing and described this as the "same pain" he felt when he was gait training in outpatient PT.   Ambulation/Gait                 Stairs            Wheelchair Mobility    Modified Rankin (Stroke Patients Only)       Balance Overall balance assessment: Modified Independent Sitting-balance support: No upper extremity supported;Feet unsupported Sitting balance-Leahy Scale: Good Sitting balance - Comments: able to sit EOB with supervision for 8 minutes to don and doff prosthesis                            Cognition Arousal/Alertness: Awake/alert Behavior During Therapy: WFL for tasks assessed/performed Overall Cognitive Status: Within Functional Limits for tasks assessed                      Exercises      General Comments        Pertinent Vitals/Pain Pain Assessment: Faces Faces Pain Scale: Hurts little more Pain Location: R UE surgical site and BL LEs (0 pain at rest. BL LE pain when standing) Pain Intervention(s): Monitored during session;Limited activity within patient's tolerance    Home Living                      Prior Function  PT Goals (current goals can now be found in the care plan section) Acute Rehab PT Goals Patient Stated Goal: did not state PT Goal Formulation: With patient Time For Goal Achievement: 05/06/14 Potential to Achieve Goals: Good Progress towards PT goals: Progressing toward goals    Frequency  Min 3X/week    PT Plan Current plan remains appropriate    Co-evaluation             End of Session Equipment Utilized During Treatment: Other (comment) Activity Tolerance: Patient tolerated treatment well;Patient limited by pain;Patient limited by fatigue Patient left: in bed (with dialysis nurse)     Time:  9030-0923 PT Time Calculation (min) (ACUTE ONLY): 24 min  Charges:                       G Codes:      Aaron Long, Aaron Long 01, 2016, 1:50 PM   Aaron Long, SPT Acute Rehabilitation Services Office: [phone]

## 2014-04-25 ENCOUNTER — Inpatient Hospital Stay (HOSPITAL_COMMUNITY)
Admission: RE | Admit: 2014-04-25 | Discharge: 2014-05-04 | DRG: 945 | Disposition: A | Discharge status: Home / Self Care | Payer: Medicare Other | Source: Intra-hospital | Attending: Physical Medicine & Rehabilitation | Admitting: Physical Medicine & Rehabilitation

## 2014-04-25 DIAGNOSIS — F5102 Adjustment insomnia: Secondary | ICD-10-CM | POA: Diagnosis present

## 2014-04-25 DIAGNOSIS — N2581 Secondary hyperparathyroidism of renal origin: Secondary | ICD-10-CM | POA: Diagnosis present

## 2014-04-25 DIAGNOSIS — R5381 Other malaise: Secondary | ICD-10-CM | POA: Diagnosis not present

## 2014-04-25 DIAGNOSIS — N179 Acute kidney failure, unspecified: Secondary | ICD-10-CM | POA: Diagnosis present

## 2014-04-25 DIAGNOSIS — Z794 Long term (current) use of insulin: Secondary | ICD-10-CM | POA: Diagnosis not present

## 2014-04-25 DIAGNOSIS — Z89512 Acquired absence of left leg below knee: Secondary | ICD-10-CM | POA: Diagnosis not present

## 2014-04-25 DIAGNOSIS — N400 Enlarged prostate without lower urinary tract symptoms: Secondary | ICD-10-CM | POA: Diagnosis present

## 2014-04-25 DIAGNOSIS — K5900 Constipation, unspecified: Secondary | ICD-10-CM | POA: Diagnosis present

## 2014-04-25 DIAGNOSIS — E1142 Type 2 diabetes mellitus with diabetic polyneuropathy: Secondary | ICD-10-CM | POA: Diagnosis present

## 2014-04-25 DIAGNOSIS — I2510 Atherosclerotic heart disease of native coronary artery without angina pectoris: Secondary | ICD-10-CM | POA: Diagnosis present

## 2014-04-25 DIAGNOSIS — Z992 Dependence on renal dialysis: Secondary | ICD-10-CM | POA: Diagnosis not present

## 2014-04-25 DIAGNOSIS — I10 Essential (primary) hypertension: Secondary | ICD-10-CM

## 2014-04-25 DIAGNOSIS — Z89511 Acquired absence of right leg below knee: Secondary | ICD-10-CM | POA: Diagnosis not present

## 2014-04-25 DIAGNOSIS — I255 Ischemic cardiomyopathy: Secondary | ICD-10-CM | POA: Diagnosis present

## 2014-04-25 DIAGNOSIS — D631 Anemia in chronic kidney disease: Secondary | ICD-10-CM | POA: Diagnosis present

## 2014-04-25 DIAGNOSIS — R200 Anesthesia of skin: Secondary | ICD-10-CM | POA: Diagnosis not present

## 2014-04-25 DIAGNOSIS — Z86718 Personal history of other venous thrombosis and embolism: Secondary | ICD-10-CM

## 2014-04-25 DIAGNOSIS — Z951 Presence of aortocoronary bypass graft: Secondary | ICD-10-CM

## 2014-04-25 DIAGNOSIS — Z87891 Personal history of nicotine dependence: Secondary | ICD-10-CM

## 2014-04-25 DIAGNOSIS — E875 Hyperkalemia: Secondary | ICD-10-CM | POA: Diagnosis present

## 2014-04-25 DIAGNOSIS — N185 Chronic kidney disease, stage 5: Secondary | ICD-10-CM | POA: Diagnosis not present

## 2014-04-25 DIAGNOSIS — I252 Old myocardial infarction: Secondary | ICD-10-CM | POA: Diagnosis not present

## 2014-04-25 DIAGNOSIS — I120 Hypertensive chronic kidney disease with stage 5 chronic kidney disease or end stage renal disease: Secondary | ICD-10-CM | POA: Diagnosis present

## 2014-04-25 DIAGNOSIS — E1140 Type 2 diabetes mellitus with diabetic neuropathy, unspecified: Secondary | ICD-10-CM | POA: Diagnosis not present

## 2014-04-25 DIAGNOSIS — D638 Anemia in other chronic diseases classified elsewhere: Secondary | ICD-10-CM | POA: Diagnosis present

## 2014-04-25 DIAGNOSIS — N186 End stage renal disease: Secondary | ICD-10-CM | POA: Diagnosis not present

## 2014-04-25 DIAGNOSIS — E785 Hyperlipidemia, unspecified: Secondary | ICD-10-CM | POA: Diagnosis present

## 2014-04-25 DIAGNOSIS — Z89519 Acquired absence of unspecified leg below knee: Secondary | ICD-10-CM | POA: Diagnosis not present

## 2014-04-25 DIAGNOSIS — E1129 Type 2 diabetes mellitus with other diabetic kidney complication: Secondary | ICD-10-CM | POA: Diagnosis not present

## 2014-04-25 DIAGNOSIS — E1165 Type 2 diabetes mellitus with hyperglycemia: Secondary | ICD-10-CM | POA: Diagnosis present

## 2014-04-25 DIAGNOSIS — N184 Chronic kidney disease, stage 4 (severe): Secondary | ICD-10-CM | POA: Diagnosis not present

## 2014-04-25 DIAGNOSIS — I739 Peripheral vascular disease, unspecified: Secondary | ICD-10-CM

## 2014-04-25 LAB — BASIC METABOLIC PANEL
ANION GAP: 13 (ref 5–15)
BUN: 26 mg/dL — ABNORMAL HIGH (ref 6–23)
CHLORIDE: 99 mmol/L (ref 96–112)
CO2: 28 mmol/L (ref 19–32)
CREATININE: 5.18 mg/dL — AB (ref 0.50–1.35)
Calcium: 8.9 mg/dL (ref 8.4–10.5)
GFR calc Af Amer: 11 mL/min — ABNORMAL LOW (ref >90)
GFR calc non Af Amer: 10 mL/min — ABNORMAL LOW (ref >90)
Glucose, Bld: 178 mg/dL — ABNORMAL HIGH (ref 70–99)
Potassium: 4.1 mmol/L (ref 3.5–5.1)
SODIUM: 140 mmol/L (ref 135–145)

## 2014-04-25 LAB — GLUCOSE, CAPILLARY
GLUCOSE-CAPILLARY: 155 mg/dL — AB (ref 70–99)
GLUCOSE-CAPILLARY: 201 mg/dL — AB (ref 70–99)
Glucose-Capillary: 224 mg/dL — ABNORMAL HIGH (ref 70–99)
Glucose-Capillary: 151 mg/dL — ABNORMAL HIGH (ref 70–99)

## 2014-04-25 LAB — CBC
HCT: 29.8 % — ABNORMAL LOW (ref 39.0–52.0)
HEMOGLOBIN: 9.3 g/dL — AB (ref 13.0–17.0)
MCH: 28.7 pg (ref 26.0–34.0)
MCHC: 31.2 g/dL (ref 30.0–36.0)
MCV: 92 fL (ref 78.0–100.0)
Platelets: 210 10*3/uL (ref 150–400)
RBC: 3.24 MIL/uL — ABNORMAL LOW (ref 4.22–5.81)
RDW: 14.9 % (ref 11.5–15.5)
WBC: 7.9 10*3/uL (ref 4.0–10.5)

## 2014-04-25 MED ORDER — GUAIFENESIN-DM 100-10 MG/5ML PO SYRP: 5.0000 mL | ORAL_SOLUTION | Freq: Four times a day (QID) | ORAL | Status: DC | PRN

## 2014-04-25 MED ORDER — AMLODIPINE BESYLATE 10 MG PO TABS
10.0000 mg | ORAL_TABLET | Freq: Every day | ORAL | Status: DC
  Administered 2014-04-26 – 2014-05-03 (×6): 10 mg via ORAL
  Filled 2014-04-25 (×10): qty 1

## 2014-04-25 MED ORDER — PROMETHAZINE HCL 12.5 MG PO TABS: 12.5000 mg | ORAL_TABLET | Freq: Four times a day (QID) | ORAL | Status: DC | PRN

## 2014-04-25 MED ORDER — POLYETHYLENE GLYCOL 3350 17 G PO PACK: 17.0000 g | PACK | Freq: Every day | ORAL | Status: DC

## 2014-04-25 MED ORDER — BISACODYL 10 MG RE SUPP: 10.0000 mg | Freq: Every day | RECTAL | Status: DC | PRN

## 2014-04-25 MED ORDER — DOXAZOSIN MESYLATE 1 MG PO TABS
1.0000 mg | ORAL_TABLET | Freq: Every day | ORAL | Status: DC
  Administered 2014-04-26 – 2014-05-02 (×7): 1 mg via ORAL
  Filled 2014-04-25 (×9): qty 1

## 2014-04-25 MED ORDER — PROMETHAZINE HCL 25 MG/ML IJ SOLN: 12.5000 mg | Freq: Four times a day (QID) | INTRAMUSCULAR | Status: DC | PRN

## 2014-04-25 MED ORDER — ALUMINUM HYDROXIDE GEL 320 MG/5ML PO SUSP
10.0000 mL | ORAL | Status: DC | PRN
  Filled 2014-04-25: qty 30

## 2014-04-25 MED ORDER — SENNOSIDES-DOCUSATE SODIUM 8.6-50 MG PO TABS
2.0000 | ORAL_TABLET | Freq: Two times a day (BID) | ORAL | Status: DC
  Administered 2014-04-25 – 2014-05-04 (×16): 2 via ORAL
  Filled 2014-04-25 (×16): qty 2

## 2014-04-25 MED ORDER — DARBEPOETIN ALFA 60 MCG/0.3ML IJ SOSY: 60.0000 ug | PREFILLED_SYRINGE | INTRAMUSCULAR | Status: DC

## 2014-04-25 MED ORDER — DOXERCALCIFEROL 4 MCG/2ML IV SOLN
2.0000 ug | INTRAVENOUS | Status: DC
  Administered 2014-04-26 – 2014-04-29 (×2): 2 ug via INTRAVENOUS
  Filled 2014-04-25 (×3): qty 2

## 2014-04-25 MED ORDER — INSULIN DETEMIR 100 UNIT/ML ~~LOC~~ SOLN
15.0000 [IU] | Freq: Every day | SUBCUTANEOUS | Status: DC
  Administered 2014-04-26 – 2014-04-27 (×2): 15 [IU] via SUBCUTANEOUS
  Filled 2014-04-25 (×2): qty 0.15

## 2014-04-25 MED ORDER — NEPRO/CARBSTEADY PO LIQD: 237.0000 mL | Freq: Every day | ORAL | Status: DC

## 2014-04-25 MED ORDER — ACETAMINOPHEN 325 MG PO TABS: 325.0000 mg | ORAL_TABLET | ORAL | Status: DC | PRN

## 2014-04-25 MED ORDER — SENNOSIDES-DOCUSATE SODIUM 8.6-50 MG PO TABS: 2.0000 | ORAL_TABLET | Freq: Two times a day (BID) | ORAL | Status: DC

## 2014-04-25 MED ORDER — RENA-VITE PO TABS
1.0000 | ORAL_TABLET | Freq: Every day | ORAL | Status: DC
  Administered 2014-04-25 – 2014-05-03 (×9): 1 via ORAL
  Filled 2014-04-25 (×10): qty 1

## 2014-04-25 MED ORDER — PREGABALIN 50 MG PO CAPS
75.0000 mg | ORAL_CAPSULE | Freq: Every day | ORAL | Status: DC
  Administered 2014-04-26 – 2014-05-04 (×9): 75 mg via ORAL
  Filled 2014-04-25 (×18): qty 1

## 2014-04-25 MED ORDER — NEPRO/CARBSTEADY PO LIQD
237.0000 mL | Freq: Every day | ORAL | Status: DC
  Administered 2014-04-26 – 2014-05-03 (×4): 237 mL via ORAL

## 2014-04-25 MED ORDER — ENOXAPARIN SODIUM 30 MG/0.3ML ~~LOC~~ SOLN
30.0000 mg | SUBCUTANEOUS | Status: DC
  Administered 2014-04-25 – 2014-05-03 (×9): 30 mg via SUBCUTANEOUS
  Filled 2014-04-25 (×10): qty 0.3

## 2014-04-25 MED ORDER — DOXERCALCIFEROL 4 MCG/2ML IV SOLN: 2.0000 ug | INTRAVENOUS | Status: DC

## 2014-04-25 MED ORDER — CLOPIDOGREL BISULFATE 75 MG PO TABS
75.0000 mg | ORAL_TABLET | Freq: Every day | ORAL | Status: DC
  Administered 2014-04-26 – 2014-05-04 (×9): 75 mg via ORAL
  Filled 2014-04-25 (×12): qty 1

## 2014-04-25 MED ORDER — INSULIN ASPART 100 UNIT/ML ~~LOC~~ SOLN
0.0000 [IU] | Freq: Three times a day (TID) | SUBCUTANEOUS | Status: DC
  Administered 2014-04-26 – 2014-04-27 (×2): 2 [IU] via SUBCUTANEOUS
  Administered 2014-04-27: 3 [IU] via SUBCUTANEOUS
  Administered 2014-04-27: 2 [IU] via SUBCUTANEOUS
  Administered 2014-04-28 (×2): 3 [IU] via SUBCUTANEOUS
  Administered 2014-04-28: 2 [IU] via SUBCUTANEOUS
  Administered 2014-04-29: 7 [IU] via SUBCUTANEOUS
  Administered 2014-04-30 (×2): 2 [IU] via SUBCUTANEOUS
  Administered 2014-05-01: 1 [IU] via SUBCUTANEOUS
  Administered 2014-05-01: 2 [IU] via SUBCUTANEOUS
  Administered 2014-05-01: 1 [IU] via SUBCUTANEOUS
  Administered 2014-05-02: 2 [IU] via SUBCUTANEOUS
  Administered 2014-05-02 – 2014-05-03 (×2): 1 [IU] via SUBCUTANEOUS

## 2014-04-25 MED ORDER — DARBEPOETIN ALFA 60 MCG/0.3ML IJ SOSY
60.0000 ug | PREFILLED_SYRINGE | INTRAMUSCULAR | Status: DC
  Administered 2014-04-26: 60 ug via INTRAVENOUS
  Filled 2014-04-25 (×2): qty 0.3

## 2014-04-25 MED ORDER — PANTOPRAZOLE SODIUM 20 MG PO TBEC
20.0000 mg | DELAYED_RELEASE_TABLET | Freq: Every day | ORAL | Status: DC
  Administered 2014-04-26 – 2014-05-03 (×8): 20 mg via ORAL
  Filled 2014-04-25 (×10): qty 1

## 2014-04-25 MED ORDER — MINERAL OIL RE ENEM
1.0000 | ENEMA | Freq: Once | RECTAL | Status: AC
  Administered 2014-04-25: 1 via RECTAL
  Filled 2014-04-25: qty 1

## 2014-04-25 MED ORDER — INSULIN DETEMIR 100 UNIT/ML FLEXPEN: 15.0000 [IU] | PEN_INJECTOR | Freq: Every day | SUBCUTANEOUS | Status: DC

## 2014-04-25 MED ORDER — MINERAL OIL RE ENEM
1.0000 | ENEMA | Freq: Every day | RECTAL | Status: DC | PRN
  Filled 2014-04-25: qty 1

## 2014-04-25 MED ORDER — TRAZODONE HCL 50 MG PO TABS: 25.0000 mg | ORAL_TABLET | Freq: Every evening | ORAL | Status: DC | PRN

## 2014-04-25 MED ORDER — ASPIRIN EC 81 MG PO TBEC
81.0000 mg | DELAYED_RELEASE_TABLET | Freq: Every morning | ORAL | Status: DC
  Administered 2014-04-26 – 2014-05-04 (×8): 81 mg via ORAL
  Filled 2014-04-25 (×9): qty 1

## 2014-04-25 MED ORDER — INSULIN ASPART 100 UNIT/ML ~~LOC~~ SOLN
3.0000 [IU] | Freq: Three times a day (TID) | SUBCUTANEOUS | Status: DC
  Administered 2014-04-26 – 2014-05-03 (×21): 3 [IU] via SUBCUTANEOUS

## 2014-04-25 MED ORDER — SENNOSIDES-DOCUSATE SODIUM 8.6-50 MG PO TABS
2.0000 | ORAL_TABLET | Freq: Two times a day (BID) | ORAL | Status: DC
  Administered 2014-04-25: 2 via ORAL
  Filled 2014-04-25: qty 2

## 2014-04-25 MED ORDER — PROMETHAZINE HCL 12.5 MG RE SUPP: 12.5000 mg | Freq: Four times a day (QID) | RECTAL | Status: DC | PRN

## 2014-04-25 MED ORDER — QUETIAPINE FUMARATE 25 MG PO TABS
25.0000 mg | ORAL_TABLET | Freq: Every day | ORAL | Status: DC
Start: 2014-04-25 — End: 2014-05-03

## 2014-04-25 MED ORDER — PRAVASTATIN SODIUM 20 MG PO TABS
20.0000 mg | ORAL_TABLET | Freq: Every day | ORAL | Status: DC
  Administered 2014-04-26 – 2014-05-04 (×9): 20 mg via ORAL
  Filled 2014-04-25 (×11): qty 1

## 2014-04-25 MED ORDER — HYDRALAZINE HCL 50 MG PO TABS
100.0000 mg | ORAL_TABLET | Freq: Three times a day (TID) | ORAL | Status: DC
  Administered 2014-04-26 – 2014-05-03 (×17): 100 mg via ORAL
  Filled 2014-04-25 (×26): qty 2

## 2014-04-25 MED ORDER — QUETIAPINE FUMARATE 25 MG PO TABS
25.0000 mg | ORAL_TABLET | Freq: Every day | ORAL | Status: DC
  Administered 2014-04-27 – 2014-05-03 (×6): 25 mg via ORAL
  Filled 2014-04-25 (×9): qty 1

## 2014-04-25 NOTE — Progress Notes (Signed)
PMR Admission Coordinator Pre-Admission Assessment  Patient: Aaron [name] is an 74 y.o., male MRN: [id number] DOB: 03/02/1940 Height: 5\' 8"  (172.7 cm) Weight: 83.9 kg (184 lb 15.5 oz)  Insurance Information HMO: PPO: PCP: IPA: 80/20: yes OTHER: no HMO PRIMARY: Medicare a and b Policy#: [id number] Subscriber: pt Benefits: Phone #: palmetto online Name: 04/25/14 Eff. Date: 09/12/02 Deduct: $1288 Out of Pocket Max: none Life Max: none CIR: 100% SNF: 20 full days  Outpatient: 80% Co-Pay: 20% Home Health: 100% Co-Pay: none DME: 80% Co-Pay: 20% Providers: pt choice  SECONDARY: [employer] commercial Policy#: [id number] Subscriber: pt No auth required Medicaid Application Date: Case Manager:  Disability Application Date: Case Worker:   Emergency Contact Information Contact Information    Name Relation Home Work Mobile   [place] Spouse   [place] Daughter   [phone]   Ember, Gottwald   [phone]     Current Medical History  Patient Admitting Diagnosis: debility due to renal failure new HD in a patient with h/o Bilateral BKAs  History of Present Illness:   Aaron [name] is a 74 y.o. male with history of HTN, CAD, CKD, B-BKA, chronic pain; who has been reluctant to get HD in the past. He was admitted on 04/17/14 with syncope, tremors and recurrent hyperkalemia. Patient with Scr 7.1 and K-6.5. He was treated with Kayexalate, insulin and lasix and was agreeable to start to HD. Dialysis catheter and AV fistula created on 04/08 by Dr. Kellie Simmering and HD initiated. Patient has had improvement in energy levels as well as po intake but continues to have confusion at nights. Therapy evaluation done and patient  has had decrease in RUE due to pain from fistula. Patient was undergoing outpatient therapy for prosthetic training and ambulating ~ 82' with min/guard assist PTA.  Pt does get confused in the evenings since hospitalization Low dose Seroquel g hs at 6 pm. Nursing reports improvement. Plan to taper seroquel as tolerated.   Past Medical History  Past Medical History  Diagnosis Date  . PAD (peripheral artery disease)     a. R CEA 2007. b. Hx L BKA in 2013. c. s/p LE angioplasty in 2014. d. Hx R BKA in 2014.  . Gangrene of toe     dry  . Neuromuscular disorder     diabetic neuropathy  . Hypertension   . Hyperlipidemia   . Peripheral neuropathy   . Constipation     takes Miralax daily  . Hemorrhoids   . Hx of colonic polyps   . Coronary artery disease     a. s/p NSTEMI/CABGx4 in 2007 - LIMA-LAD, SVG-optional diagonal, SVVG-OM, SVG-dRCA. b. Nuc 05/2011: nonischemic.  [name] BPH (benign prostatic hypertrophy)   . Abnormality of gait   . DVT (deep venous thrombosis)     a. Lower extremity DVT in 2013.  [name] Hypertrophy of prostate without urinary obstruction and other lower urinary tract symptoms (LUTS)   . Disorder of bone and cartilage, unspecified   . Lower limb amputation, below knee   . Anemia   . Secondary hyperparathyroidism     Secondary Hyperpara- Thyroidism, Renal  . Ischemic cardiomyopathy     a. EF 40% in 2007. b. 51% by nuc 05/2011.  [name] Complication of anesthesia     "he gets delirious"  . Myocardial infarction 2005  . Type II diabetes mellitus   . Diabetic peripheral neuropathy   . ESRD (end stage renal disease)     "not ready yet for dialysis" (  04/17/2014)  . Kidney stones   . Prostate cancer 2010    Family History  family history includes Alzheimer's disease in his sister; Cancer in his brother and mother; Heart disease in his sister; Hyperlipidemia in his father  and mother; Hypertension in his father and mother; Kidney disease in his father; Other in his sister. There is no history of Coronary artery disease, Anesthesia problems, Hypotension, Malignant hyperthermia, or Pseudochol deficiency.  Prior Rehab/Hospitalizations: [facility] 2013 and 2014 and then home ,also [facility] SNF to receive antibiotics  Current Medications   Current facility-administered medications:  . 0.9 % sodium chloride infusion, , Intravenous, Continuous, Mal Misty, MD, Last Rate: 10 mL/hr at 04/19/14 1228 . amLODipine (NORVASC) tablet 10 mg, 10 mg, Oral, Daily, Caren Griffins, MD, 10 mg at 04/25/14 1032 . aspirin EC tablet 81 mg, 81 mg, Oral, q morning - 10a, Caren Griffins, MD, 81 mg at 04/25/14 1028 . bisacodyl (DULCOLAX) suppository 10 mg, 10 mg, Rectal, Daily PRN, Gardiner Barefoot, NP . clopidogrel (PLAVIX) tablet 75 mg, 75 mg, Oral, Daily, Caren Griffins, MD, 75 mg at 04/25/14 1028 . Darbepoetin Alfa (ARANESP) injection 60 mcg, 60 mcg, Intravenous, Q Fri-HD, Fleet Contras, MD, 60 mcg at 04/19/14 2111 . doxazosin (CARDURA) tablet 1 mg, 1 mg, Oral, QHS, Caren Griffins, MD, 1 mg at 04/24/14 2208 . doxercalciferol (HECTOROL) injection 2 mcg, 2 mcg, Intravenous, Q M,W,F-HD, Fleet Contras, MD, 2 mcg at 04/24/14 1551 . feeding supplement (NEPRO CARB STEADY) liquid 237 mL, 237 mL, Oral, Q1500, Kallie Locks, RD, 237 mL at 04/24/14 1833 . heparin injection 5,000 Units, 5,000 Units, Subcutaneous, 3 times per day, Caren Griffins, MD, 5,000 Units at 04/24/14 2209 . hydrALAZINE (APRESOLINE) tablet 100 mg, 100 mg, Oral, 3 times per day, Caren Griffins, MD, 100 mg at 04/24/14 2208 . insulin aspart (novoLOG) injection 0-9 Units, 0-9 Units, Subcutaneous, TID WC, Costin Karlyne Greenspan, MD, 1 Units at 04/24/14 1733 . insulin aspart (novoLOG) injection 3 Units, 3 Units, Subcutaneous, TID WC, Geradine Girt, DO, 3 Units at 04/25/14 1312 . insulin detemir (LEVEMIR)  injection 15 Units, 15 Units, Subcutaneous, Daily, Geradine Girt, DO, 15 Units at 04/25/14 1030 . mineral oil enema 1 enema, 1 enema, Rectal, Once, Florencia Reasons, MD . multivitamin (RENA-VIT) tablet 1 tablet, 1 tablet, Oral, QHS, Fleet Contras, MD, 1 tablet at 04/24/14 2208 . pantoprazole (PROTONIX) EC tablet 20 mg, 20 mg, Oral, Daily, Caren Griffins, MD, 20 mg at 04/25/14 1037 . polyethylene glycol (MIRALAX / GLYCOLAX) packet 17 g, 17 g, Oral, Daily, Jessica U Vann, DO, 17 g at 04/25/14 1028 . pravastatin (PRAVACHOL) tablet 20 mg, 20 mg, Oral, Daily, Costin Karlyne Greenspan, MD, 20 mg at 04/25/14 1030 . pregabalin (LYRICA) capsule 75 mg, 75 mg, Oral, Daily, Fleet Contras, MD, 75 mg at 04/25/14 1037 . QUEtiapine (SEROQUEL) tablet 25 mg, 25 mg, Oral, Daily, Jessica U Vann, DO, 25 mg at 04/24/14 1701 . senna-docusate (Senokot-S) tablet 2 tablet, 2 tablet, Oral, BID, Florencia Reasons, MD, 2 tablet at 04/25/14 1310 . sodium chloride 0.9 % injection 3 mL, 3 mL, Intravenous, Q12H, Caren Griffins, MD, 3 mL at 04/25/14 1033  Patients Current Diet: Diet renal with fluid restriction Fluid restriction:: 1200 mL Fluid; Room service appropriate?: Yes; Fluid consistency:: Thin Diet - low sodium heart healthy  Precautions / Restrictions Precautions Precautions: Fall Restrictions Weight Bearing Restrictions: No Other Position/Activity Restrictions: New AV fistula in R UE   Prior Activity Level  Limited Community (1-2x/wk): going to OP rehab with Shirlean Mylar on [facility] for 3 weeks due to increased weakness. Prior to increased weakness due to renal failure, pt was ambulatory with B BKA/prosthesis and walker. With recent decline, using w/c in home and wearing prosthesis. Mod I at w/c level and transfers. Wife cooks. Nephew there for past year to help pt. Wife works at [employer] for [employer] and is legally blind/sees with one eye per pt. Pt did own adls. Reluctant to start dialysis but now started this admission. Pt  states he watches a lot of tv/ [place]/sports fan. Wife reports pt frustrated and depressed and she would like him to get counseling while he is here on rehab.  Home Assistive Devices / Equipment Home Assistive Devices/Equipment: Eyeglasses, Dentures (specify type), Wheelchair, Environmental consultant (specify type), CBG Meter, Bedside commode/3-in-1, Shower chair with back, Hand-held shower hose, Prosthesis (BLE prosthesis) Home Equipment: Wheelchair - manual, Environmental consultant - 2 wheels, Other (comment) (sliding board for car transfers)  Prior Functional Level Prior Function Level of Independence: Needs assistance Gait / Transfers Assistance Needed: Currently seeing Outpatient PT for gait/prothesis training Comments: was able to transfer independently with wheelchair transfers  Current Functional Level Cognition  Overall Cognitive Status: Within Functional Limits for tasks assessed Orientation Level: Oriented X4   Extremity Assessment (includes Sensation/Coordination)  Upper Extremity Assessment: RUE deficits/detail RUE Deficits / Details: decreased use of R UE during bed mobility post-fistula creation      ADLs       Mobility  Overal bed mobility: Needs Assistance Bed Mobility: Rolling, Sidelying to Sit Rolling: Min assist Sidelying to sit: Min assist Sit to supine: Supervision General bed mobility comments: Decreased use of R UE. provided assistance under right elbow for rolling into L sidelying and sidelying to sit.     Transfers  Overall transfer level: Needs assistance Equipment used: Right platform walker, 2 person hand held assist (BL prosthesis) Transfers: Sit to/from Stand Sit to Stand: Mod assist, +2 physical assistance General transfer comment: Pt completed 3 sit to stand transfers using BL prosthesis. Level of assistance decreased and time standing increased across the 3 sit to stand trials. Elevating the bed height helped pt stand. pt started to feel pain in his BL LEs  during standing and described this as the "same pain" he felt when he was gait training in outpatient PT.     Ambulation / Gait / Stairs / Office manager / Balance Dynamic Sitting Balance Sitting balance - Comments: able to sit EOB with supervision for 8 minutes to don and doff prosthesis Balance Overall balance assessment: Modified Independent Sitting-balance support: No upper extremity supported, Feet unsupported Sitting balance-Leahy Scale: Good Sitting balance - Comments: able to sit EOB with supervision for 8 minutes to don and doff prosthesis    Special needs/care consideration  Dialysis new ESRD on HD this admit OP at [facility] at 11am Skin rue fistula surgical site Bowel mgmt: last BM 4/6 Bladder mgmt:incontinent Diabetic mgmt yes   Previous Home Environment Living Arrangements: Spouse/significant other, Other (Comment) (adult nephew, Cedrick, has lived with them for one year to h) Lives With: Spouse, Other (Comment) Available Help at Discharge: Family, Available 24 hours/day Type of Home: Apartment Home Layout: One level Home Access: Level entry Bathroom Shower/Tub: Tub/shower unit, Architectural technologist: Standard (BSC set up over toilet that he is able to lift up on) Bathroom Accessibility: Yes How Accessible: Accessible via wheelchair, Accessible via  walker Home Care Services: No Additional Comments: Over recent months pt has become weaker due to new renal failure. Mod I with RW with B prosthesis. Recently restatred OP rehab due to weakness. Using w/c and wearing prosthesis but only doing transfers due to weakness  Discharge Living Setting Plans for Discharge Living Setting: Patient's home, Lives with (comment), Other (Comment) (wife and nephew) Type of Home at Discharge: Apartment Discharge Home Layout: One level Discharge Home Access: Level entry Discharge Bathroom Shower/Tub: Tub/shower unit, Curtain Discharge Bathroom  Toilet: Standard Discharge Bathroom Accessibility: Yes How Accessible: Accessible via wheelchair, Accessible via walker Does the patient have any problems obtaining your medications?: No  Social/Family/Support Systems Patient Roles: Spouse, Parent Contact Information: Laretta Alstrom, wife Anticipated Caregiver: wife works but nephew, Garment/textile technologist" living with them for one year to assist pt Anticipated Caregiver's Contact Information: see above Ability/Limitations of Caregiver: wife legally blind and works fulltime at [employer] for [employer], [employer] there 24/7 Caregiver Availability: 24/7 Discharge Plan Discussed with Primary Caregiver: Yes Is Caregiver In Agreement with Plan?: Yes Does Caregiver/Family have Issues with Lodging/Transportation while Pt is in Rehab?: No (wife staying with pt in his room while hospitalized at [facility]) Wife legally blind but does see with one eye. She works Biochemist, clinical. Nephew, Boss, has moved in with couple in the past year to assist in his care. Has disabled dtr in [place], son in [place] and son in [place]. Pt retired from [employer] in [place], originally from [place], [place].  Goals/Additional Needs Patient/Family Goal for Rehab: Mod I with PT and OT Expected length of stay: ELOS 7 days Equipment Needs: New ESRD this admission. OP dialysis arranged for [facility] at 11 am. Pt uses [facility] for OP rehab. They need to be arranged for OP dialysis transport Special Service Needs: Pt admits to depression. Neuropsych recommended by wife Pt/Family Agrees to Admission and willing to participate: Yes Program Orientation Provided & Reviewed with Pt/Caregiver Including Roles & Responsibilities: Yes  Decrease burden of Care through IP rehab admission: n/a  Possible need for SNF placement upon discharge:not anticipated  Patient Condition: This patient's medical and functional status has changed since the consult dated: 04/23/14 in which the Rehabilitation Physician determined  and documented that the patient's condition is appropriate for intensive rehabilitative care in an inpatient rehabilitation facility. See "History of Present Illness" (above) for medical update. Functional changes are: min to mod assist transfers only. Patient's medical and functional status update has been discussed with the Rehabilitation physician and patient remains appropriate for inpatient rehabilitation. Will admit to inpatient rehab today.  Preadmission Screen Completed By: Cleatrice Burke, 04/25/2014 1:13 PM ______________________________________________________________________  Discussed status with Dr. Letta Pate on 04/25/14 at 1311 and received telephone approval for admission today.  Admission Coordinator: Cleatrice Burke, time 1311 Date 04/25/14          Cosigned by: Charlett Blake, MD at 04/25/2014 1:18 PM  Revision History     Date/Time User Provider Type Action   04/25/2014 1:18 PM Charlett Blake, MD Physician Cosign   04/25/2014 1:13 PM Cristina Gong, RN Rehab Admission Coordinator Sign

## 2014-04-25 NOTE — PMR Pre-admission (Signed)
PMR Admission Coordinator Pre-Admission Assessment  Patient: Aaron [name] is an 74 y.o., male MRN: [id number] DOB: [birth year] Height: 5\' 8"  (172.7 cm) Weight: 83.9 kg (184 lb 15.5 oz)              Insurance Information HMO:     PPO:      PCP:      IPA:      80/20: yes     OTHER: no HMO PRIMARY: Medicare a and b      Policy#: [id number]      Subscriber: pt Benefits:  Phone #: palmetto online     Name: 04/25/14 Eff. Date: 09/12/02     Deduct: $1288      Out of Pocket Max: none      Life Max: none CIR: 100%      SNF: 20 full days  Outpatient: 80%     Co-Pay: 20% Home Health: 100%      Co-Pay: none DME: 80%     Co-Pay: 20% Providers: pt choice  SECONDARY: [employer] commercial      Policy#: [id number]      Subscriber: pt No auth required Medicaid Application Date:       Case Manager:  Disability Application Date:       Case Worker:   Emergency Contact Information Contact Information    Name Relation Home Work Mobile   Aaron [name] Spouse   Aaron [name] Daughter   [phone]   Aaron, [name]   [phone]     Current Medical History  Patient Admitting Diagnosis: debility due to renal failure new HD in a patient with h/o Bilateral BKAs  History of Present Illness:   Aaron [name] is a 74 y.o. male with history of HTN, CAD, CKD, B-BKA, chronic pain; who has been reluctant to get HD in the past. He was admitted on 04/17/14 with syncope, tremors and recurrent hyperkalemia. Patient with Scr 7.1 and K-6.5. He was treated with Kayexalate, insulin and lasix and was agreeable to start to HD. Dialysis catheter and AV fistula created on 04/08 by Dr. Kellie [name] and HD initiated. Patient has had improvement in energy levels as well as po intake but continues to have confusion at nights. Therapy evaluation done and patient has had decrease in RUE due to pain from fistula. Patient was undergoing outpatient therapy for prosthetic training and ambulating ~ 46' with min/guard assist  PTA.  Pt does get confused in the evenings since hospitalization Low dose Seroquel g hs at 6 pm. Nursing reports improvement. Plan to taper seroquel as tolerated.   Past Medical History  Past Medical History  Diagnosis Date  . PAD (peripheral artery disease)     a. R CEA 2007. b. Hx L BKA in 2013. c. s/p LE angioplasty in 2014. d. Hx R BKA in 2014.  . Gangrene of toe     dry  . Neuromuscular disorder     diabetic neuropathy  . Hypertension   . Hyperlipidemia   . Peripheral neuropathy   . Constipation     takes Miralax daily  . Hemorrhoids   . Hx of colonic polyps   . Coronary artery disease     a. s/p NSTEMI/CABGx4 in 2007 - LIMA-LAD, SVG-optional diagonal, SVVG-OM, SVG-dRCA. b. Nuc 05/2011: nonischemic.  [name] BPH (benign prostatic hypertrophy)   . Abnormality of gait   . DVT (deep venous thrombosis)     a. Lower extremity DVT in 2013.  [name] Hypertrophy of prostate without urinary obstruction and  other lower urinary tract symptoms (LUTS)   . Disorder of bone and cartilage, unspecified   . Lower limb amputation, below knee   . Anemia   . Secondary hyperparathyroidism     Secondary Hyperpara- Thyroidism, Renal  . Ischemic cardiomyopathy     a. EF 40% in 2007. b. 51% by nuc 05/2011.  [name] Complication of anesthesia     "he gets delirious"  . Myocardial infarction 2005  . Type II diabetes mellitus   . Diabetic peripheral neuropathy   . ESRD (end stage renal disease)     "not ready yet for dialysis" (04/17/2014)  . Kidney stones   . Prostate cancer 2010    Family History  family history includes Alzheimer's disease in his sister; Cancer in his brother and mother; Heart disease in his sister; Hyperlipidemia in his father and mother; Hypertension in his father and mother; Kidney disease in his father; Other in his sister. There is no history of Coronary artery disease, Anesthesia problems, Hypotension, Malignant hyperthermia, or Pseudochol deficiency.  Prior Rehab/Hospitalizations: [facility]  2013 and 2014 and then home ,also [facility] to receive antibiotics   Current Medications   Current facility-administered medications:  .  0.9 %  sodium chloride infusion, , Intravenous, Continuous, Aaron Misty, MD, Last Rate: 10 mL/hr at 04/19/14 1228 .  amLODipine (NORVASC) tablet 10 mg, 10 mg, Oral, Daily, Aaron Griffins, MD, 10 mg at 04/25/14 1032 .  aspirin EC tablet 81 mg, 81 mg, Oral, q morning - 10a, Aaron Griffins, MD, 81 mg at 04/25/14 1028 .  bisacodyl (DULCOLAX) suppository 10 mg, 10 mg, Rectal, Daily PRN, Aaron Barefoot, NP .  clopidogrel (PLAVIX) tablet 75 mg, 75 mg, Oral, Daily, Aaron Griffins, MD, 75 mg at 04/25/14 1028 .  Darbepoetin Alfa (ARANESP) injection 60 mcg, 60 mcg, Intravenous, Q Fri-HD, Aaron Contras, MD, 60 mcg at 04/19/14 2111 .  doxazosin (CARDURA) tablet 1 mg, 1 mg, Oral, QHS, Aaron Griffins, MD, 1 mg at 04/24/14 2208 .  doxercalciferol (HECTOROL) injection 2 mcg, 2 mcg, Intravenous, Q M,W,F-HD, Aaron Contras, MD, 2 mcg at 04/24/14 1551 .  feeding supplement (NEPRO CARB STEADY) liquid 237 mL, 237 mL, Oral, Q1500, Aaron [name], RD, 237 mL at 04/24/14 1833 .  heparin injection 5,000 Units, 5,000 Units, Subcutaneous, 3 times per day, Aaron Griffins, MD, 5,000 Units at 04/24/14 2209 .  hydrALAZINE (APRESOLINE) tablet 100 mg, 100 mg, Oral, 3 times per day, Aaron Griffins, MD, 100 mg at 04/24/14 2208 .  insulin aspart (novoLOG) injection 0-9 Units, 0-9 Units, Subcutaneous, TID WC, Aaron Karlyne Greenspan, MD, 1 Units at 04/24/14 1733 .  insulin aspart (novoLOG) injection 3 Units, 3 Units, Subcutaneous, TID WC, Aaron Girt, DO, 3 Units at 04/25/14 1312 .  insulin detemir (LEVEMIR) injection 15 Units, 15 Units, Subcutaneous, Daily, Aaron Girt, DO, 15 Units at 04/25/14 1030 .  mineral oil enema 1 enema, 1 enema, Rectal, Once, Aaron Reasons, MD .  multivitamin (RENA-VIT) tablet 1 tablet, 1 tablet, Oral, QHS, Aaron Contras, MD, 1 tablet at 04/24/14  2208 .  pantoprazole (PROTONIX) EC tablet 20 mg, 20 mg, Oral, Daily, Aaron Griffins, MD, 20 mg at 04/25/14 1037 .  polyethylene glycol (MIRALAX / GLYCOLAX) packet 17 g, 17 g, Oral, Daily, Aaron U Vann, DO, 17 g at 04/25/14 1028 .  pravastatin (PRAVACHOL) tablet 20 mg, 20 mg, Oral, Daily, Aaron Karlyne Greenspan, MD, 20 mg at 04/25/14 1030 .  pregabalin (LYRICA)  capsule 75 mg, 75 mg, Oral, Daily, Aaron Contras, MD, 75 mg at 04/25/14 1037 .  QUEtiapine (SEROQUEL) tablet 25 mg, 25 mg, Oral, Daily, Aaron U Vann, DO, 25 mg at 04/24/14 1701 .  senna-docusate (Senokot-S) tablet 2 tablet, 2 tablet, Oral, BID, Aaron Reasons, MD, 2 tablet at 04/25/14 1310 .  sodium chloride 0.9 % injection 3 mL, 3 mL, Intravenous, Q12H, Aaron Griffins, MD, 3 mL at 04/25/14 1033  Patients Current Diet: Diet renal with fluid restriction Fluid restriction:: 1200 mL Fluid; Room service appropriate?: Yes; Fluid consistency:: Thin Diet - low sodium heart healthy  Precautions / Restrictions Precautions Precautions: Fall Restrictions Weight Bearing Restrictions: No Other Position/Activity Restrictions: New AV fistula in R UE   Prior Activity Level Limited Community (1-2x/wk): going to OP rehab with Shirlean Mylar on [facility] for 3 weeks due to increased weakness. Prior to increased weakness due to renal failure, pt was ambulatory with B BKA/prosthesis and walker. With recent decline, using w/c in home and wearing prosthesis. Mod I at w/c level and transfers. Wife cooks. Nephew there for past year to help pt. Wife works at [employer] for [employer] and is legally blind/sees with one eye per pt. Pt did own adls. Reluctant to start dialysis but now started this admission. Pt states he watches a lot of tv/ [employer]/sports fan. Wife reports pt frustrated and depressed and she would like him to get counseling while he is here on rehab.  Home Assistive Devices / Equipment Home Assistive Devices/Equipment: Eyeglasses, Dentures (specify type),  Wheelchair, Environmental consultant (specify type), CBG Meter, Bedside commode/3-in-1, Shower chair with back, Hand-held shower hose, Prosthesis (BLE prosthesis) Home Equipment: Wheelchair - manual, Environmental consultant - 2 wheels, Other (comment) (sliding board for car transfers)  Prior Functional Level Prior Function Level of Independence: Needs assistance Gait / Transfers Assistance Needed: Currently seeing Outpatient PT for gait/prothesis training Comments: was able to transfer independently with wheelchair transfers  Current Functional Level Cognition  Overall Cognitive Status: Within Functional Limits for tasks assessed Orientation Level: Oriented X4    Extremity Assessment (includes Sensation/Coordination)  Upper Extremity Assessment: RUE deficits/detail RUE Deficits / Details: decreased use of R UE during bed mobility post-fistula creation       ADLs       Mobility  Overal bed mobility: Needs Assistance Bed Mobility: Rolling, Sidelying to Sit Rolling: Min assist Sidelying to sit: Min assist Sit to supine: Supervision General bed mobility comments: Decreased use of R UE. provided assistance under right elbow for rolling into L sidelying and sidelying to sit.     Transfers  Overall transfer level: Needs assistance Equipment used: Right platform walker, 2 person hand held assist (BL prosthesis) Transfers: Sit to/from Stand Sit to Stand: Mod assist, +2 physical assistance General transfer comment: Pt completed 3 sit to stand transfers using BL prosthesis. Level of assistance decreased and time standing increased across the 3 sit to stand trials.  Elevating the bed height helped pt stand. pt started to feel pain in his BL LEs during standing and described this as the "same pain" he felt when he was gait training in outpatient PT.     Ambulation / Gait / Stairs / Office manager / Balance Dynamic Sitting Balance Sitting balance - Comments: able to sit EOB with supervision for 8 minutes  to don and doff prosthesis Balance Overall balance assessment: Modified Independent Sitting-balance support: No upper extremity supported, Feet unsupported Sitting balance-Leahy Scale: Good Sitting balance -  Comments: able to sit EOB with supervision for 8 minutes to don and doff prosthesis    Special needs/care consideration  Dialysis new ESRD on HD this admit OP at [facility] at 11am Skin rue fistula surgical site Bowel mgmt: last BM 4/6 Bladder mgmt:incontinent Diabetic mgmt yes   Previous Home Environment Living Arrangements: Spouse/significant other, Other (Comment) (adult nephew, Cedrick, has lived with them for one year to h)  Lives With: Spouse, Other (Comment) Available Help at Discharge: Family, Available 24 hours/day Type of Home: Apartment Home Layout: One level Home Access: Level entry Bathroom Shower/Tub: Tub/shower unit, Architectural technologist: Standard (BSC set up over toilet that he is able to lift up on) Bathroom Accessibility: Yes How Accessible: Accessible via wheelchair, Accessible via [place]: No Additional Comments: Over recent months pt has become weaker due to new renal failure. Mod I with RW with B prosthesis. Recently restatred OP rehab due to weakness. Using w/c and wearing prosthesis but only doing transfers due to weakness  Discharge Living Setting Plans for Discharge Living Setting: Patient's home, Lives with (comment), Other (Comment) (wife and nephew) Type of Home at Discharge: Apartment Discharge Home Layout: One level Discharge Home Access: Level entry Discharge Bathroom Shower/Tub: Tub/shower unit, Curtain Discharge Bathroom Toilet: Standard Discharge Bathroom Accessibility: Yes How Accessible: Accessible via wheelchair, Accessible via walker Does the patient have any problems obtaining your medications?: No  Social/Family/Support Systems Patient Roles: Spouse, Parent Contact Information: Laretta Alstrom,  wife Anticipated Caregiver: wife works  but nephew, Garment/textile technologist" living with them for one year to assist pt Anticipated Caregiver's Contact Information: see above Ability/Limitations of Caregiver: wife legally blind and works fulltime at [employer] for [employer], [employer] there 24/7 Caregiver Availability: 24/7 Discharge Plan Discussed with Primary Caregiver: Yes Is Caregiver In Agreement with Plan?: Yes Does Caregiver/Family have Issues with Lodging/Transportation while Pt is in Rehab?: No (wife staying with pt in his room while hospitalized at [facility]) Wife legally blind but does see with one eye. She works Biochemist, clinical. Nephew, Boss, has moved in with couple in the past year to assist in his care. Has disabled dtr in [place], son in [place] and son in [place]. Pt retired from [employer] in [place], originally from [place], [place].  Goals/Additional Needs Patient/Family Goal for Rehab: Mod I with PT and OT Expected length of stay: ELOS 7 days Equipment Needs: New ESRD this admission. OP dialysis arranged for [facility] at 11 am. Pt uses [facility] for OP rehab. They need to be arranged for OP dialysis transport Special Service Needs: Pt admits to depression. Neuropsych recommended by wife Pt/Family Agrees to Admission and willing to participate: Yes Program Orientation Provided & Reviewed with Pt/Caregiver Including Roles  & Responsibilities: Yes  Decrease burden of Care through IP rehab admission: n/a  Possible need for SNF placement upon discharge:not anticipated  Patient Condition: This patient's medical and functional status has changed since the consult dated: 04/23/14 in which the Rehabilitation Physician determined and documented that the patient's condition is appropriate for intensive rehabilitative care in an inpatient rehabilitation facility. See "History of Present Illness" (above) for medical update. Functional changes are: min to mod assist transfers only. Patient's medical and functional status  update has been discussed with the Rehabilitation physician and patient remains appropriate for inpatient rehabilitation. Will admit to inpatient rehab today.  Preadmission Screen Completed By:  Cleatrice Burke, 04/25/2014 1:13 PM ______________________________________________________________________   Discussed status with Dr. Letta Pate on 04/25/14  at  1311 and received telephone approval for admission today.  Admission Coordinator:  Cleatrice Burke, time 1311 Date 04/25/14

## 2014-04-25 NOTE — Progress Notes (Signed)
Assessment: 1. New ESRD.  2. Hyperkalemia, resolved 3. DM 4. Sec HPTH PTH 455 on hectorol 5. Anemia on aranesp  Plan: 1.Has OP slot at [facility] on TTS at 11 2 HD in AM 3 Rehab transfer today  Subjective: Interval History: For rehab  Objective: Vital signs in last 24 hours: Temp:  [98.1 F (36.7 C)-98.8 F (37.1 C)] 98.4 F (36.9 C) (04/14 0930) Pulse Rate:  [64-81] 74 (04/14 0930) Resp:  [16-18] 17 (04/14 0930) BP: (120-157)/(49-72) 120/52 mmHg (04/14 0930) SpO2:  [94 %-98 %] 98 % (04/14 0930) Weight:  [83.9 kg (184 lb 15.5 oz)] 83.9 kg (184 lb 15.5 oz) (04/13 1602) Weight change: -0.379 kg (-13.4 oz)  Intake/Output from previous day: 04/13 0701 - 04/14 0700 In: 360 [P.O.:360] Out: 3000  Intake/Output this shift: Total I/O In: 360 [P.O.:360] Out: 0   General appearance: alert and cooperative Chest wall: no tenderness Cardio: regular rate and rhythm, S1, S2 normal, no murmur, click, rub or gallop Extremities: bilat amputee  Lab Results:  Recent Labs  04/24/14 1301 04/25/14 0615  WBC 8.4 7.9  HGB 9.1* 9.3*  HCT 28.4* 29.8*  PLT 212 210   BMET:  Recent Labs  04/24/14 1302 04/25/14 0615  NA 135 140  K 3.9 4.1  CL 97 99  CO2 26 28  GLUCOSE 204* 178*  BUN 51* 26*  CREATININE 6.91* 5.18*  CALCIUM 9.0 8.9   No results for input(s): PTH in the last 72 hours. Iron Studies: No results for input(s): IRON, TIBC, TRANSFERRIN, FERRITIN in the last 72 hours. Studies/Results: No results found.  Scheduled: . amLODipine  10 mg Oral Daily  . aspirin EC  81 mg Oral q morning - 10a  . clopidogrel  75 mg Oral Daily  . darbepoetin (ARANESP) injection - DIALYSIS  60 mcg Intravenous Q Fri-HD  . doxazosin  1 mg Oral QHS  . doxercalciferol  2 mcg Intravenous Q M,W,F-HD  . feeding supplement (NEPRO CARB STEADY)  237 mL Oral Q1500  . heparin  5,000 Units Subcutaneous 3 times per day  . hydrALAZINE  100 mg Oral 3 times per day  . insulin aspart  0-9 Units  Subcutaneous TID WC  . insulin aspart  3 Units Subcutaneous TID WC  . insulin detemir  15 Units Subcutaneous Daily  . mineral oil  1 enema Rectal Once  . multivitamin  1 tablet Oral QHS  . pantoprazole  20 mg Oral Daily  . polyethylene glycol  17 g Oral Daily  . pravastatin  20 mg Oral Daily  . pregabalin  75 mg Oral Daily  . QUEtiapine  25 mg Oral Daily  . senna-docusate  2 tablet Oral BID  . sodium chloride  3 mL Intravenous Q12H      LOS: 8 days   Chaslyn Eisen C 04/25/2014,12:26 PM

## 2014-04-25 NOTE — Progress Notes (Signed)
Pt prepared for d/c to CIR. IV left in place. Skin intact except as most recently charted. Vitals are stable. Report called to [place] on 4W. Pt to be transported via bed by RN & NT.  Jillyn Ledger, MBA, BS, RN

## 2014-04-25 NOTE — Progress Notes (Signed)
Meredith Staggers, MD Physician Signed Physical Medicine and Rehabilitation Consult Note 04/23/2014 8:39 AM  Related encounter: ED to Hosp-Admission (Current) from 04/17/2014 in [place] Collapse All        Physical Medicine and Rehabilitation Consult  Reason for Consult: Deconditioning due to progression to ESRD Referring Physician: Dr. Eliseo Squires   HPI: Aaron [name] is a 74 y.o. male with history of HTN, CAD, CKD, B-BKA, chronic pain; who has been reluctant to get HD in the past. He was admitted on 04/17/14 with syncope, tremors and recurrent hyperkalemia. Patient with Scr 7.1 and K-6.5. He was treated with Kayexalate, insulin and lasix and was agreeable to start to HD. Dialysis catheter and AV fistula created on 04/08 by Dr. Kellie Simmering and HD initiated. Patient has had improvement in energy levels as well as po intake but continues to have confusion at nights. Therapy evaluation done and patient has had decrease in RUE due to pain from fistula. Patient was undergoing outpatient therapy for prosthetic training and ambulating ~ 78' with min/guard assist PTA. CIR recommended by MD/PT.    Review of Systems  Respiratory: Negative for shortness of breath.  Cardiovascular: Negative for chest pain.      Past Medical History  Diagnosis Date  . PAD (peripheral artery disease)     a. R CEA 2007. b. Hx L BKA in 2013. c. s/p LE angioplasty in 2014. d. Hx R BKA in 2014.  . Gangrene of toe     dry  . Neuromuscular disorder     diabetic neuropathy  . Hypertension   . Hyperlipidemia   . Peripheral neuropathy   . Constipation     takes Miralax daily  . Hemorrhoids   . Hx of colonic polyps   . Coronary artery disease     a. s/p NSTEMI/CABGx4 in 2007 - LIMA-LAD, SVG-optional diagonal, SVVG-OM, SVG-dRCA. b. Nuc 05/2011: nonischemic.  [name] BPH (benign prostatic hypertrophy)   . Abnormality of gait    . DVT (deep venous thrombosis)     a. Lower extremity DVT in 2013.  [name] Hypertrophy of prostate without urinary obstruction and other lower urinary tract symptoms (LUTS)   . Disorder of bone and cartilage, unspecified   . Lower limb amputation, below knee   . Anemia   . Secondary hyperparathyroidism     Secondary Hyperpara- Thyroidism, Renal  . Ischemic cardiomyopathy     a. EF 40% in 2007. b. 51% by nuc 05/2011.  [name] Complication of anesthesia     "he gets delirious"  . Myocardial infarction 2005  . Type II diabetes mellitus   . Diabetic peripheral neuropathy   . ESRD (end stage renal disease)     "not ready yet for dialysis" (04/17/2014)  . Kidney stones   . Prostate cancer 2010    Past Surgical History  Procedure Laterality Date  . Prostatectomy  2009  . Knee cartilage surgery Left 1964  . Coronary artery bypass graft  2005    CABG X4  . Carotid endarterectomy Right 2005  . Cataract extraction w/ intraocular lens implant, bilateral Bilateral 2011  . Colonoscopy    . Cardiac catheterization  05/28/11  . Amputation  06/14/2011    Procedure: AMPUTATION DIGIT; Surgeon: Elam Dutch, MD; Location: [facility] OR; Service: Vascular; Laterality: Left; Amputation Left fifth toe  . Amputation  06/16/2011    Procedure: AMPUTATION BELOW KNEE; Surgeon: Elam Dutch, MD; Location: [place]; Service: Vascular;  Laterality: Left;  . I&d extremity  01/31/2012    Procedure: IRRIGATION AND DEBRIDEMENT EXTREMITY; Surgeon: Elam Dutch, MD; Location: [place]; Service: Vascular; Laterality: Left; I & D Left BKA   . Amputation Right 06/12/2012    Procedure: AMPUTATION DIGIT; Surgeon: Elam Dutch, MD; Location[facility] OR; Service: Vascular; Laterality: Right; GREAT TOE  . Amputation Right 08/08/2012    Procedure: AMPUTATION BELOW KNEE; Surgeon: Elam Dutch,  MD; Location: [facility] OR; Service: Vascular; Laterality: Right;  . Abdominal aortagram N/A 05/28/2011    Procedure: ABDOMINAL Maxcine Ham; Surgeon: Elam Dutch, MD; Location: [facility] CATH LAB; Service: Cardiovascular; Laterality: N/A;  . Abdominal aortagram N/A 06/02/2012    Procedure: ABDOMINAL Maxcine Ham; Surgeon: Elam Dutch, MD; Location: [facility] CATH LAB; Service: Cardiovascular; Laterality: N/A;  . Abdominal aortagram N/A 06/09/2012    Procedure: ABDOMINAL Maxcine Ham; Surgeon: Elam Dutch, MD; Location: [facility] CATH LAB; Service: Cardiovascular; Laterality: N/A;    Family History  Problem Relation Age of Onset  . Coronary artery disease Neg Hx   . Anesthesia problems Neg Hx   . Hypotension Neg Hx   . Malignant hyperthermia Neg Hx   . Pseudochol deficiency Neg Hx   . Hyperlipidemia Mother   . Hypertension Mother   . Cancer Mother   . Hyperlipidemia Father   . Hypertension Father   . Kidney disease Father   . Heart disease Sister   . Alzheimer's disease Sister   . Cancer Brother   . Other Sister     Social History: Married. Independent PTA. Wife works days. Per reports that he quit smoking about 23 years ago. His smoking use included Cigarettes. He has a 30 pack-year smoking history. He has never used smokeless tobacco. Per reports that he drinks alcohol. He reports that he does not use illicit drugs.    Allergies  Allergen Reactions  . Oxycodone Other (See Comments)    Hallucinations    Medications Prior to Admission  Medication Sig Dispense Refill  . AMBULATORY NON FORMULARY MEDICATION BD U/P Mini Pen [place] 31GX5MM Use three times daily as directed DX:250.00 100 each 11  . AMBULATORY NON FORMULARY MEDICATION One Touch Ultra 2 Test Strips Sig: Check blood sugars Three times daily to keep blood sugars under control Dx: 250.00, 401.9 100 strip 11    . amLODipine (NORVASC) 10 MG tablet take 1 tablet by mouth once daily 90 tablet 1  . aspirin EC 81 MG tablet Take 1 tablet (81 mg total) by mouth every morning.    . clopidogrel (PLAVIX) 75 MG tablet take 1 tablet by mouth once daily 90 tablet 1  . docusate sodium (COLACE) 100 MG capsule Take 1 capsule (100 mg total) by mouth 2 (two) times daily. 10 capsule 0  . doxazosin (CARDURA) 1 MG tablet take 1 tablet by mouth at bedtime 90 tablet 1  . hydrALAZINE (APRESOLINE) 100 MG tablet Take 1 tablet (100 mg total) by mouth every 8 (eight) hours. 90 tablet 0  . HYDROcodone-acetaminophen (NORCO/VICODIN) 5-325 MG per tablet Take 1 tablet by mouth every 12 (twelve) hours as needed for moderate pain. 60 tablet 0  . insulin aspart (NOVOLOG) 100 UNIT/ML FlexPen Inject 5 Units into the skin 3 (three) times daily with meals. Inject 5 units three times a day with meals if blood sugar greater than 150 only, DX: 250.71    . Insulin Detemir (LEVEMIR) 100 UNIT/ML Pen Inject 22 Units into the skin at bedtime. 15 mL 3  . metoprolol succinate (TOPROL-XL)  25 MG 24 hr tablet Take 1 tablet (25 mg total) by mouth daily. 90 tablet 1  . Misc. Devices [facility] [place] Patient needs new Wheelchair due to his being broken. 1 each 0  . pantoprazole (PROTONIX) 20 MG tablet Take 1 tablet (20 mg total) by mouth daily. 90 tablet 1  . pravastatin (PRAVACHOL) 20 MG tablet Take 1 tablet (20 mg total) by mouth daily. 90 tablet 3  . pregabalin (LYRICA) 75 MG capsule Take 1 capsule (75 mg total) by mouth daily. (Patient taking differently: Take 75 mg by mouth 2 (two) times daily. ) 30 capsule 0  . sodium polystyrene (KAYEXALATE) 15 GM/60ML suspension Take 60 mLs (15 g total) by mouth 2 (two) times daily. One day (Patient not taking: Reported on 04/17/2014) 120 mL 0    Home: Home Living Family/patient expects to be discharged to:: Private residence Living  Arrangements: Spouse/significant other, Other relatives Available Help at Discharge: Family, Other (Comment) (not sure reliability of family help) Type of Home: Apartment Home Access: Level entry Home Layout: One level Home Equipment: Wheelchair - manual, Environmental consultant - 2 wheels, Other (comment) (sliding board for car transfers)  Functional History: Prior Function Level of Independence: Needs assistance Gait / Transfers Assistance Needed: Currently seeing Outpatient PT for gait/prothesis training Comments: was able to transfer independently with wheelchair transfers Functional Status:  Mobility: Bed Mobility Overal bed mobility: Needs Assistance Bed Mobility: Rolling, Sidelying to Sit, Sit to Supine Rolling: Mod assist Sidelying to sit: Mod assist Sit to supine: Min assist General bed mobility comments: hesistant to roll. physical assistance to right shoulder to acheive left sidelying. tactile cues to push through L elbow to transition from sidelying to sit. Physical assistance givien to trunk to achieve upright and to pelvis to swing legs to clear edge of bed. Pt opted to lay back down after doing seated exercises, turned to get his legs back on the bed with ease and needed min assist at upper back to transfer to supine. Next, transition from supine to prone with mod to max assist and verbal cues to move UEs to get to prone. Used bed pad to aid in return to supine after therex        ADL:    Cognition: Cognition Overall Cognitive Status: Within Functional Limits for tasks assessed (slow to answer questions at times) Orientation Level: Oriented to person, Oriented to place, Disoriented to time, Disoriented to situation Cognition Arousal/Alertness: Awake/alert (though extremely fatigued post-HD) Behavior During Therapy: WFL for tasks assessed/performed, Flat affect Overall Cognitive Status: Within Functional Limits for tasks assessed (slow to answer questions at times)  Blood pressure  136/55, pulse 67, temperature 98.1 F (36.7 C), temperature source Oral, resp. rate 18, height 5\' 8"  (1.727 m), weight 87.635 kg (193 lb 3.2 oz), SpO2 96 %. Physical Exam  Nursing note and vitals reviewed. Constitutional: He is oriented to person, place, and time. He appears well-developed and well-nourished.  HENT:  Head: Normocephalic and atraumatic.  Eyes: Conjunctivae are normal. Pupils are equal, round, and reactive to light.  Neck: Normal range of motion. Neck supple.  Cardiovascular: Normal rate and regular rhythm.  Respiratory: Effort normal and breath sounds normal. No respiratory distress. He has no wheezes.  GI: Soft. Bowel sounds are normal. He exhibits no distension. There is no tenderness.  Musculoskeletal: He exhibits no edema or tenderness.  bk's well formed. Wearing shrinkers  Neurological: He is alert and oriented to person, place, and time.  UE 4- deltoid, 4/5 biceps, tricep,  HI LE: 3/5 HF 4/5 ke. Some pain along left knee  Skin: Skin is warm and dry.  Psychiatric: His affect is blunt. He is withdrawn.     Lab Results Last 24 Hours    Results for orders placed or performed during the hospital encounter of 04/17/14 (from the past 24 hour(s))  Glucose, capillary Status: Abnormal   Collection Time: 04/22/14 11:55 AM  Result Value Ref Range   Glucose-Capillary 159 (H) 70 - 99 mg/dL  Glucose, capillary Status: Abnormal   Collection Time: 04/22/14 4:37 PM  Result Value Ref Range   Glucose-Capillary 158 (H) 70 - 99 mg/dL  Glucose, capillary Status: Abnormal   Collection Time: 04/22/14 9:15 PM  Result Value Ref Range   Glucose-Capillary 170 (H) 70 - 99 mg/dL  Glucose, capillary Status: Abnormal   Collection Time: 04/23/14 7:50 AM  Result Value Ref Range   Glucose-Capillary 113 (H) 70 - 99 mg/dL      Imaging Results (Last 48 hours)    No results found.    Assessment/Plan: Diagnosis: debility due to  renal failure new HD in a patient with recent BKA's 1. Does the need for close, 24 hr/day medical supervision in concert with the patient's rehab needs make it unreasonable for this patient to be served in a less intensive setting? Yes 2. Co-Morbidities requiring supervision/potential complications: CAD, PAD, CKD 3. Due to bladder management, bowel management, safety, skin/wound care, disease management, medication administration, pain management and patient education, does the patient require 24 hr/day rehab nursing? Yes 4. Does the patient require coordinated care of a physician, rehab nurse, PT (1-2 hrs/day, 5 days/week) and OT (1-2 hrs/day, 5 days/week) to address physical and functional deficits in the context of the above medical diagnosis(es)? Yes Addressing deficits in the following areas: balance, endurance, locomotion, strength, transferring, bowel/bladder control, bathing, dressing, feeding, grooming, toileting and psychosocial support 5. Can the patient actively participate in an intensive therapy program of at least 3 hrs of therapy per day at least 5 days per week? Yes 6. The potential for patient to make measurable gains while on inpatient rehab is excellent 7. Anticipated functional outcomes upon discharge from inpatient rehab are modified independent with PT, modified independent with OT, n/a with SLP. 8. Estimated rehab length of stay to reach the above functional goals is: 7 days 9. Does the patient have adequate social supports and living environment to accommodate these discharge functional goals? Yes 10. Anticipated D/C setting: Home 11. Anticipated post D/C treatments: Pierz therapy 12. Overall Rehab/Functional Prognosis: excellent  RECOMMENDATIONS: This patient's condition is appropriate for continued rehabilitative care in the following setting: CIR Patient has agreed to participate in recommended program. Yes Note that insurance prior authorization may be required for  reimbursement for recommended care.  Comment: Rehab Admissions Coordinator to follow up.  Thanks,  Meredith Staggers, MD, [facility] [place]     04/23/2014       Revision History     Date/Time User Provider Type Action   04/23/2014 11:31 AM Meredith Staggers, MD Physician Sign   04/23/2014 9:37 AM Bary Leriche, PA-C Physician Assistant Share   View Details Report       Routing History     Date/Time From To Method   04/23/2014 11:31 AM Meredith Staggers, MD Meredith Staggers, MD In Basket   04/23/2014 11:31 AM Meredith Staggers, MD [name], NP In Basket

## 2014-04-25 NOTE — Care Management Note (Signed)
CARE MANAGEMENT NOTE 04/25/2014  Patient:  Aaron [name]   Account Number:  [id number]  Date Initiated:  04/19/2014  Documentation initiated by:  ,  Subjective/Objective Assessment:   CM following for progression and d/c planning.     Action/Plan:   Met with pt wife, IM given, will continue to follow for possible new start HD.   Anticipated DC Date:  04/25/2014   Anticipated DC Plan:  IP REHAB FACILITY         Choice offered to / List presented to:             Status of service:  Completed, signed off Medicare Important Message given?  YES (If response is "NO", the following Medicare IM given date fields will be blank) Date Medicare IM given:  04/19/2014 Medicare IM given by:  , Date Additional Medicare IM given:  04/24/2014 Additional Medicare IM given by:  JULIE AMERSON  Discharge Disposition:  IP REHAB FACILITY  Per UR Regulation:    If discussed at [name] Length of Stay Meetings, dates discussed:    Comments:  04/25/2014 IM given and explained to pt . CRoyal RN MPH, case manager, [phone]  CARE MANAGEMENT NOTE 04/25/2014  Patient:  Aaron [name]   Account Number:  [id number]  Date Initiated:  04/19/2014  Documentation initiated by:  ,  Subjective/Objective Assessment:   CM following for progression and d/c planning.     Action/Plan:   Met with pt wife, IM given, will continue to follow for possible new start HD.   Anticipated DC Date:  04/25/2014   Anticipated DC Plan:  IP REHAB FACILITY         Choice offered to / List presented to:             Status of service:  Completed, signed off Medicare Important Message given?  YES (If response is "NO", the following Medicare IM given date fields will be blank) Date Medicare IM given:  04/19/2014 Medicare IM given by:  , Date Additional Medicare IM given:  04/24/2014 Additional Medicare IM given by:  JULIE AMERSON  Discharge Disposition:  IP REHAB FACILITY  Per UR  Regulation:    If discussed at [name] Length of Stay Meetings, dates discussed:    Comments:  04/25/2014 IM given and explained to pt . CRoyal RN MPH, case manager, [phone]   

## 2014-04-25 NOTE — Discharge Summary (Signed)
Discharge Summary  Aaron [name] SHF:[id number] DOB: 1940/07/17  PCP: [name], NP  Admit date: 04/17/2014 Discharge date: 04/25/2014  Time spent: >73mins  Recommendations for Outpatient Follow-up:  1. F/u with vascular surgery (s/p AVF placement) 2. F/u with nephrology, HD MWF 3. F/u with cardiology, bradycardia (betablocker stopped) 4. F/u with pmd  Discharge Diagnoses:  Active Hospital Problems   Diagnosis Date Noted  . Hyperkalemia 04/24/2012  . Syncope 04/17/2014  . Uncontrolled hypertension   . Acute on chronic renal failure 04/08/2014  . Status post bilateral below knee amputation 04/02/2014  . Hypertensive renal disease 06/13/2013  . Diabetes mellitus with peripheral vascular disease 09/06/2012  . CKD (chronic kidney disease) stage 4, GFR 15-29 ml/min 05/02/2011  . PAD (peripheral artery disease) 04/28/2011  . CAD (coronary artery disease) 04/28/2011    Resolved Hospital Problems   Diagnosis Date Noted Date Resolved  No resolved problems to display.    Discharge Condition: stable  Diet recommendation: heart healthy/carb modified  Filed Weights   04/23/14 2118 04/24/14 1215 04/24/14 1602  Weight: 88.179 kg (194 lb 6.4 oz) 87.8 kg (193 lb 9 oz) 83.9 kg (184 lb 15.5 oz)    History of present illness:  Aaron [name] is a 74 y.o. male has a past medical history significant for chronic kidney disease stage IV, followed by Dr. Joelyn Oms as an outpatient, hypertension, hyperlipidemia, coronary artery disease, peripheral vascular disease status post bilateral BKA, diabetes, presents to the emergency room brought by family because of a syncopal episode earlier today. Patient has little recollection about a syncopal episode, however denies any chest pain, denies any shortness of breath, denies any abdominal pain nausea or vomiting. He denies any palpitations. He has a history of hyperkalemia in the setting of poor compliance with his diet, was hospitalized about  a week ago for elevated potassium. He and the family reports compliance with his diet as well as his medications, and they are surprised about his potassium Level being high today. He endorses a throat "discomfort / rattling sounds" that started this morning And feel like they are persistent in the emergency room. His wife witnessed a syncopal episode, he was alert and oriented, and all of a sudden rolled his eyes back and did not appear to be breathing. She called 911 and was instructed to do chest compressions, she did about 4 of those and patient came back. He was confused for less than a minute afterwards. The whole episode lasted 1-2 minutes. Family also think that he may have been a little bit more confused today. They did not report any decreased urination. In the emergency room, patient was found to have an elevated potassium of 6.8, confirmed on repeat, and an elevated BUN to 75 and a creatinine of 6.8 from baseline of 4.5 just a week ago. He also has a mild troponin leak of 0.11, stable on repeat.   Hospital Course:  Principal Problem:   Hyperkalemia Active Problems:   CAD (coronary artery disease)   PAD (peripheral artery disease)   CKD (chronic kidney disease) stage 4, GFR 15-29 ml/min   Diabetes mellitus with peripheral vascular disease   Hypertensive renal disease   Status post bilateral below knee amputation   Acute on chronic renal failure   Uncontrolled hypertension   Syncope Hyperkalemia: Corrected   Syncope: Patient was bradycardic on admission. May have been related to hyperkalemia. No issues since  Metoprolol stopped and potassium corrected.  Heart rate currently in the 60s. He  recently had a 2-D echocardiogram on 04/09/2014 which showed an ejection fraction of 60-65%, mild LVH, Grade 1 diastolic dysfunction without any wall motion abnormalities. Outpatient cardiology follow up.  end-stage renal disease: s/p access by vascular, HD per nephro-   CAD (coronary artery  disease): Troponin trend flat. EKG without ischemic changes. No chest pain. Continue plavix/statin,  betablocker stopped due to bradycardia.   PAD (peripheral artery disease), Status post bilateral below knee amputation   Diabetes mellitus with peripheral vascular disease: CBGs reasonable on lower dose of Levemir. Continue levemir and SSI.     Uncontrolled hypertension, bp stable on current regimen  AMS- patient gets confused in the evenings- low dose seroqeul started qhs at [facility]- much better night per nursing. Taper seroqeul as tolerated.  Constipation: s/p enema x1 on 4/14. Continue stool softener.   Code Status: full Family Communication: patient Disposition Plan: CIR  Consultants:  nephrology  Vascular surgery  Procedures:   Right IJ dialysis catheter insertion 4/11  Right arm avf creation 4/11  HD  Antibiotics:  none    Discharge Exam: BP 120/52 mmHg  Pulse 74  Temp(Src) 98.4 F (36.9 C) (Oral)  Resp 17  Ht 5\' 8"  (1.727 m)  Wt 83.9 kg (184 lb 15.5 oz)  BMI 28.13 kg/m2  SpO2 98%   General: Awake, NAD  Cardiovascular: rrr   Respiratory: Clear, no wheezing  Abdomen: soft nontender nondistended   Ext: no edema. Bilateral BKA's.   Discharge Instructions You were cared for by a hospitalist during your hospital stay. If you have any questions about your discharge medications or the care you received while you were in the hospital after you are discharged, you can call the unit and asked to speak with the hospitalist on call if the hospitalist that took care of you is not available. Once you are discharged, your primary care physician will handle any further medical issues. Please note that NO REFILLS for any discharge medications will be authorized once you are discharged, as it is imperative that you return to your primary care physician (or establish a relationship with a primary care physician if you do not have one) for your aftercare  needs so that they can reassess your need for medications and monitor your lab values.  Discharge Instructions    Diet - low sodium heart healthy    Complete by:  As directed      Increase activity slowly    Complete by:  As directed             Medication List    STOP taking these medications        docusate sodium 100 MG capsule  Commonly known as:  COLACE     metoprolol succinate 25 MG 24 hr tablet  Commonly known as:  TOPROL-XL     sodium polystyrene 15 GM/60ML suspension  Commonly known as:  KAYEXALATE      TAKE these medications        AMBULATORY NON FORMULARY MEDICATION  - BD U/P Mini Pen [place] 31GX5MM  - Use three times daily as directed  - DX:250.00     AMBULATORY NON FORMULARY MEDICATION  - One Touch Ultra 2 Test Strips  - Sig:  Check blood sugars Three times daily to keep blood sugars under control  - Dx: 250.00, 401.9     amLODipine 10 MG tablet  Commonly known as:  NORVASC  take 1 tablet by mouth once daily     aspirin EC 81  MG tablet  Take 1 tablet (81 mg total) by mouth every morning.     clopidogrel 75 MG tablet  Commonly known as:  PLAVIX  take 1 tablet by mouth once daily     Darbepoetin Alfa 60 MCG/0.3ML Sosy injection  Commonly known as:  ARANESP  Inject 0.3 mLs (60 mcg total) into the vein every Friday with hemodialysis.     doxazosin 1 MG tablet  Commonly known as:  CARDURA  take 1 tablet by mouth at bedtime     doxercalciferol 4 MCG/2ML injection  Commonly known as:  HECTOROL  Inject 1 mL (2 mcg total) into the vein every Monday, Wednesday, and Friday with hemodialysis.     feeding supplement (NEPRO CARB STEADY) Liqd  Take 237 mLs by mouth daily at 3 pm.     hydrALAZINE 100 MG tablet  Commonly known as:  APRESOLINE  Take 1 tablet (100 mg total) by mouth every 8 (eight) hours.     HYDROcodone-acetaminophen 5-325 MG per tablet  Commonly known as:  NORCO/VICODIN  Take 1 tablet by mouth every 12 (twelve) hours as needed for  moderate pain.     insulin aspart 100 UNIT/ML FlexPen  Commonly known as:  NOVOLOG  Inject 5 Units into the skin 3 (three) times daily with meals. Inject 5 units three times a day with meals if blood sugar greater than 150 only, DX: 250.71     Insulin Detemir 100 UNIT/ML Pen  Commonly known as:  LEVEMIR  Inject 15 Units into the skin at bedtime.     pantoprazole 20 MG tablet  Commonly known as:  PROTONIX  Take 1 tablet (20 mg total) by mouth daily.     polyethylene glycol packet  Commonly known as:  MIRALAX / GLYCOLAX  Take 17 g by mouth daily.     pravastatin 20 MG tablet  Commonly known as:  PRAVACHOL  Take 1 tablet (20 mg total) by mouth daily.     pregabalin 75 MG capsule  Commonly known as:  LYRICA  Take 1 capsule (75 mg total) by mouth daily.     QUEtiapine 25 MG tablet  Commonly known as:  SEROQUEL  Take 1 tablet (25 mg total) by mouth daily.     senna-docusate 8.6-50 MG per tablet  Commonly known as:  Senokot-S  Take 2 tablets by mouth 2 (two) times daily.     Wheelchair Misc  Patient needs new Wheelchair due to his being broken.       Allergies  Allergen Reactions  . Oxycodone Other (See Comments)    Hallucinations       Follow-up Information    Follow up with [name], MD In 6 weeks.   Specialty:  Vascular Surgery   Why:  message sent to office   Contact information:   [place] [id number] [phone]       Follow up with Sherrie Mustache K, NP In 4 weeks.   Specialty:  Nurse Practitioner   Why:  hsopital follow up   Contact information:   [place] [address] [phone]       Follow up with Shelva Majestic A, MD In 4 weeks.   Specialty:  Cardiology   Why:  bradycardia, betablocker stopped. h/o cad   Contact information:   [address] [place] [address] [phone]        The results of significant diagnostics from this hospitalization (including imaging, microbiology,  ancillary and  laboratory) are listed below for reference.    Significant Diagnostic Studies: Dg Chest 2 View  04/17/2014   CLINICAL DATA:  Centralized chest pain with shortness of breath tonight.  EXAM: CHEST  2 VIEW  COMPARISON:  04/08/2014  FINDINGS: Postoperative changes in the mediastinum. Shallow inspiration. Mild cardiac enlargement with central pulmonary vascular congestion. Interstitial opacities suggest mild interstitial edema. No focal consolidation. No blunting of costophrenic angles. No pneumothorax. Calcification of aorta. Degenerative changes in the shoulders.  IMPRESSION: Cardiac enlargement with pulmonary vascular congestion interstitial edema.   Electronically Signed   By: Lucienne Capers M.D.   On: 04/17/2014 06:23   Dg Chest 2 View  04/08/2014   CLINICAL DATA:  Midsternal chest pain since 4 a.m. Chest tightness. History diabetes.  EXAM: CHEST  2 VIEW  COMPARISON:  11/07/2013  FINDINGS: There is mild left basilar atelectasis. There is no focal parenchymal opacity, pleural effusion, or pneumothorax. The heart and mediastinal contours are unremarkable. There is evidence of prior CABG. There is thoracic aortic atherosclerosis.  The osseous structures are unremarkable.  IMPRESSION: No active cardiopulmonary disease.   Electronically Signed   By: Kathreen Devoid   On: 04/08/2014 10:35   Ct Head Wo Contrast  04/17/2014   CLINICAL DATA:  Syncopal episode today. Seizure. Patient does not remember event.  EXAM: CT HEAD WITHOUT CONTRAST  TECHNIQUE: Contiguous axial images were obtained from the base of the skull through the vertex without intravenous contrast.  COMPARISON:  None.  FINDINGS: Diffuse cerebral atrophy. Ventricular dilatation consistent with central atrophy. Low-attenuation changes in the deep white matter consistent with small vessel ischemia. No mass effect or midline shift. No abnormal extra-axial fluid collections. Gray-white matter junctions are distinct. Basal cisterns are not  effaced. No evidence of acute intracranial hemorrhage. No depressed skull fractures. Mucosal thickening in the paranasal sinuses. Mastoid air cells are not opacified. Vascular calcifications.  IMPRESSION: No acute intracranial abnormalities. Chronic atrophy and small vessel ischemic changes.   Electronically Signed   By: Lucienne Capers M.D.   On: 04/17/2014 06:49   Dg Chest Port 1 View  04/19/2014   CLINICAL DATA:  Hemodialysis catheter insertion.  EXAM: PORTABLE CHEST - 1 VIEW  COMPARISON:  04/17/2014 and prior chest radiographs.  FINDINGS: Mild cardiomegaly and CABG changes again noted.  A right IJ central venous catheter is identified with tip overlying the mid- lower SVC.  Interstitial pulmonary edema has decreased.  There is no evidence of pneumothorax or pleural effusion.  IMPRESSION: Right IJ central venous catheter with tip overlying the mid-lower SVC. No evidence of pneumothorax.  Decreased interstitial pulmonary edema.   Electronically Signed   By: Margarette Canada M.D.   On: 04/19/2014 21:17   Dg Fluoro Guide Cv Line-no Report  04/19/2014   CLINICAL DATA:    FLOURO GUIDE CV LINE  Fluoroscopy was utilized by the requesting physician.  No radiographic  interpretation.     Microbiology: No results found for this or any previous visit (from the past 240 hour(s)).   Labs: Basic Metabolic Panel:  Recent Labs Lab 04/19/14 0850 04/20/14 1138 04/22/14 0719 04/24/14 1302 04/25/14 0615  NA 140 136 137 135 140  K 3.8 4.1 4.0 3.9 4.1  CL 103 99 100 97 99  CO2 29 26 30 26 28   GLUCOSE 186* 296* 148* 204* 178*  BUN 78* 55* 50* 51* 26*  CREATININE 7.37* 6.85* 7.30* 6.91* 5.18*  CALCIUM 8.3* 8.3* 8.8 9.0 8.9  PHOS 6.3*  --  5.2*  5.1*  --    Liver Function Tests:  Recent Labs Lab 04/19/14 0850 04/22/14 0719 04/24/14 1302  ALBUMIN 3.1* 2.9* 3.0*   No results for input(s): LIPASE, AMYLASE in the last 168 hours. No results for input(s): AMMONIA in the last 168 hours. CBC:  Recent  Labs Lab 04/20/14 1138 04/22/14 0719 04/24/14 1301 04/25/14 0615  WBC 9.9 8.7 8.4 7.9  HGB 8.5* 8.1* 9.1* 9.3*  HCT 27.4* 25.4* 28.4* 29.8*  MCV 94.2 91.7 90.4 92.0  PLT 148* 161 212 210   Cardiac Enzymes: No results for input(s): CKTOTAL, CKMB, CKMBINDEX, TROPONINI in the last 168 hours. BNP: BNP (last 3 results) No results for input(s): BNP in the last 8760 hours.  ProBNP (last 3 results) No results for input(s): PROBNP in the last 8760 hours.  CBG:  Recent Labs Lab 04/24/14 1132 04/24/14 1649 04/24/14 2153 04/25/14 0807 04/25/14 1124  GLUCAP 171* 128* 230* 155* 224*       Signed:  Yajayra Feldt MD, PhD  [facility] 04/25/2014, 12:07 PM

## 2014-04-25 NOTE — H&P (Signed)
Physical Medicine and Rehabilitation Admission H&P   Chief Complaint  Patient presents with  . debility due to renal failure new HD in a patient with recent BKA's    HPI: Aaron [name] is a 74 y.o. male with history of HTN, CAD, CKD, B-BKA, chronic pain; who has been reluctant to get HD in the past. He was admitted on 04/17/14 with syncope, tremors and recurrent hyperkalemia. Patient with Scr 7.1 and K-6.5. CT head negative for acute changes. He was treated with Kayexalate, insulin and lasix and was agreeable to start to HD. Dialysis catheter and AV fistula created on 04/08 by Dr. Kellie Simmering and HD initiated. Patient has had improvement in energy levels and po intake and nighttime confusion is resolving. Therapy ongoing and patient limited by RUE pain with limited use as well as deconditioning. PTA- he was undergoing outpatient therapy for prosthetic training and ambulating ~ 91' with min/guard assist PTA. CIR recommended by MD  Sleeping but awakens easily, states he's had no problems with his prostheses thus far  ROS Drowsy, no Respiratory issues, no bowel or bladder problems except for constipation. Mild surgical pain in the Right upper extremity   Past Medical History  Diagnosis Date  . PAD (peripheral artery disease)     a. R CEA 2007. b. Hx L BKA in 2013. c. s/p LE angioplasty in 2014. d. Hx R BKA in 2014.  . Gangrene of toe     dry  . Neuromuscular disorder     diabetic neuropathy  . Hypertension   . Hyperlipidemia   . Peripheral neuropathy   . Constipation     takes Miralax daily  . Hemorrhoids   . Hx of colonic polyps   . Coronary artery disease     a. s/p NSTEMI/CABGx4 in 2007 - LIMA-LAD, SVG-optional diagonal, SVVG-OM, SVG-dRCA. b. Nuc 05/2011: nonischemic.  [name] BPH (benign prostatic hypertrophy)   . Abnormality of gait   . DVT (deep venous thrombosis)     a. Lower extremity DVT in 2013.  [name]  Hypertrophy of prostate without urinary obstruction and other lower urinary tract symptoms (LUTS)   . Disorder of bone and cartilage, unspecified   . Lower limb amputation, below knee   . Anemia   . Secondary hyperparathyroidism     Secondary Hyperpara- Thyroidism, Renal  . Ischemic cardiomyopathy     a. EF 40% in 2007. b. 51% by nuc 05/2011.  [name] Complication of anesthesia     "he gets delirious"  . Myocardial infarction 2005  . Type II diabetes mellitus   . Diabetic peripheral neuropathy   . ESRD (end stage renal disease)     "not ready yet for dialysis" (04/17/2014)  . Kidney stones   . Prostate cancer 2010    Past Surgical History  Procedure Laterality Date  . Prostatectomy  2009  . Knee cartilage surgery Left 1964  . Coronary artery bypass graft  2005    CABG X4  . Carotid endarterectomy Right 2005  . Cataract extraction w/ intraocular lens implant, bilateral Bilateral 2011  . Colonoscopy    . Cardiac catheterization  05/28/11  . Amputation  06/14/2011    Procedure: AMPUTATION DIGIT; Surgeon: Elam Dutch, MD; Location: [facility] OR; Service: Vascular; Laterality: Left; Amputation Left fifth toe  . Amputation  06/16/2011    Procedure: AMPUTATION BELOW KNEE; Surgeon: Elam Dutch, MD; Location: [place]; Service: Vascular; Laterality: Left;  . I&d extremity  01/31/2012    Procedure: IRRIGATION AND DEBRIDEMENT EXTREMITY;  Surgeon: Elam Dutch, MD; Location: [facility] OR; Service: Vascular; Laterality: Left; I & D Left BKA   . Amputation Right 06/12/2012    Procedure: AMPUTATION DIGIT; Surgeon: Elam Dutch, MD; Location: [facility] OR; Service: Vascular; Laterality: Right; GREAT TOE  . Amputation Right 08/08/2012    Procedure: AMPUTATION BELOW KNEE; Surgeon: Elam Dutch, MD; Location: [facility] OR; Service: Vascular; Laterality: Right;  . Abdominal  aortagram N/A 05/28/2011    Procedure: ABDOMINAL Maxcine Ham; Surgeon: Elam Dutch, MD; Location: [facility] CATH LAB; Service: Cardiovascular; Laterality: N/A;  . Abdominal aortagram N/A 06/02/2012    Procedure: ABDOMINAL Maxcine Ham; Surgeon: Elam Dutch, MD; Location: [facility] CATH LAB; Service: Cardiovascular; Laterality: N/A;  . Abdominal aortagram N/A 06/09/2012    Procedure: ABDOMINAL Maxcine Ham; Surgeon: Elam Dutch, MD; Location: [facility] CATH LAB; Service: Cardiovascular; Laterality: N/A;  . Insertion of dialysis catheter Right 04/19/2014    Procedure: INSERTION OF DIALYSIS CATHETER-INTERNAL JUGULAR; Surgeon: Mal Misty, MD; Location: [place]; Service: Vascular; Laterality: Right;  . Av fistula placement Right 04/19/2014    Procedure: RIGHT ARM ARTERIOVENOUS (AV) FISTULA CREATION; Surgeon: Mal Misty, MD; Location: [facility] OR; Service: Vascular; Laterality: Right;    Family History  Problem Relation Age of Onset  . Coronary artery disease Neg Hx   . Anesthesia problems Neg Hx   . Hypotension Neg Hx   . Malignant hyperthermia Neg Hx   . Pseudochol deficiency Neg Hx   . Hyperlipidemia Mother   . Hypertension Mother   . Cancer Mother   . Hyperlipidemia Father   . Hypertension Father   . Kidney disease Father   . Heart disease Sister   . Alzheimer's disease Sister   . Cancer Brother   . Other Sister     Social History: Married. Independent PTA. Wife works days. Per reports that he quit smoking about 23 years ago. His smoking use included Cigarettes. He has a 30 pack-year smoking history. He has never used smokeless tobacco. Per reports that he drinks alcohol. He reports that he does not use illicit drugs.    Allergies  Allergen Reactions  . Oxycodone Other (See Comments)    Hallucinations    Medications Prior to Admission  Medication Sig Dispense  Refill  . AMBULATORY NON FORMULARY MEDICATION BD U/P Mini Pen [place] 31GX5MM Use three times daily as directed DX:250.00 100 each 11  . AMBULATORY NON FORMULARY MEDICATION One Touch Ultra 2 Test Strips Sig: Check blood sugars Three times daily to keep blood sugars under control Dx: 250.00, 401.9 100 strip 11  . amLODipine (NORVASC) 10 MG tablet take 1 tablet by mouth once daily 90 tablet 1  . aspirin EC 81 MG tablet Take 1 tablet (81 mg total) by mouth every morning.    . clopidogrel (PLAVIX) 75 MG tablet take 1 tablet by mouth once daily 90 tablet 1  . docusate sodium (COLACE) 100 MG capsule Take 1 capsule (100 mg total) by mouth 2 (two) times daily. 10 capsule 0  . doxazosin (CARDURA) 1 MG tablet take 1 tablet by mouth at bedtime 90 tablet 1  . hydrALAZINE (APRESOLINE) 100 MG tablet Take 1 tablet (100 mg total) by mouth every 8 (eight) hours. 90 tablet 0  . HYDROcodone-acetaminophen (NORCO/VICODIN) 5-325 MG per tablet Take 1 tablet by mouth every 12 (twelve) hours as needed for moderate pain. 60 tablet 0  . insulin aspart (NOVOLOG) 100 UNIT/ML FlexPen Inject 5 Units into the skin 3 (three) times daily with meals. Inject 5 units  three times a day with meals if blood sugar greater than 150 only, DX: 250.71    . Insulin Detemir (LEVEMIR) 100 UNIT/ML Pen Inject 22 Units into the skin at bedtime. 15 mL 3  . metoprolol succinate (TOPROL-XL) 25 MG 24 hr tablet Take 1 tablet (25 mg total) by mouth daily. 90 tablet 1  . Misc. Devices [facility]) [place] Patient needs new Wheelchair due to his being broken. 1 each 0  . pantoprazole (PROTONIX) 20 MG tablet Take 1 tablet (20 mg total) by mouth daily. 90 tablet 1  . pravastatin (PRAVACHOL) 20 MG tablet Take 1 tablet (20 mg total) by mouth daily. 90 tablet 3  . pregabalin (LYRICA) 75 MG capsule Take 1 capsule (75 mg total) by mouth daily. (Patient taking differently: Take 75 mg by  mouth 2 (two) times daily. ) 30 capsule 0  . sodium polystyrene (KAYEXALATE) 15 GM/60ML suspension Take 60 mLs (15 g total) by mouth 2 (two) times daily. One day (Patient not taking: Reported on 04/17/2014) 120 mL 0    Home: Home Living Family/patient expects to be discharged to:: Private residence Living Arrangements: Spouse/significant other, Other relatives Available Help at Discharge: Family, Other (Comment) (not sure reliability of family help) Type of Home: Apartment Home Access: Level entry Home Layout: One level Home Equipment: Wheelchair - manual, Environmental consultant - 2 wheels, Other (comment) (sliding board for car transfers)  Functional History: Prior Function Level of Independence: Needs assistance Gait / Transfers Assistance Needed: Currently seeing Outpatient PT for gait/prothesis training Comments: was able to transfer independently with wheelchair transfers  Functional Status:  Mobility: Bed Mobility Overal bed mobility: Needs Assistance Bed Mobility: Rolling, Sidelying to Sit Rolling: Min assist Sidelying to sit: Min assist Sit to supine: Supervision General bed mobility comments: Decreased use of R UE. provided assistance under right elbow for rolling into L sidelying and sidelying to sit.  Transfers Overall transfer level: Needs assistance Equipment used: Right platform walker, 2 person hand held assist (BL prosthesis) Transfers: Sit to/from Stand Sit to Stand: Mod assist, +2 physical assistance General transfer comment: Pt completed 3 sit to stand transfers using BL prosthesis. Level of assistance decreased and time standing increased across the 3 sit to stand trials. Elevating the bed height helped pt stand. pt started to feel pain in his BL LEs during standing and described this as the "same pain" he felt when he was gait training in outpatient PT.       ADL:    Cognition: Cognition Overall Cognitive Status: Within Functional Limits for tasks  assessed Orientation Level: Oriented X4 Cognition Arousal/Alertness: Awake/alert Behavior During Therapy: WFL for tasks assessed/performed Overall Cognitive Status: Within Functional Limits for tasks assessed   Blood pressure 120/52, pulse 74, temperature 98.4 F (36.9 C), temperature source Oral, resp. rate 17, height _0  (1.727 m), weight 83.9 kg (184 lb 15.5 oz), SpO2 98 %. Physical Exam      General: No acute distress,Drowsy but awakens easily Mood and affect are appropriate Heart: Regular rate and rhythm no rubs murmurs or extra sounds Lungs: Clear to auscultation, breathing unlabored, no rales or wheezes Abdomen: Positive bowel sounds, soft nontender to palpation, nondistended Extremities: No clubbing, cyanosis, or edema Skin: No evidence of breakdown, no evidence of rash Neurologic: Cranial nerves II through XII intact, motor strength isSensory exam normal sensation to light touch and proprioception in bilateral upperextremities , Lower extremities not tested secondary to amputations,UE 4- deltoid, 4/5 biceps, tricep, HI LE: 3/5 HF 4/5 ke. Some  pain along left knee Cerebellar exam normal finger to nose to finger as well as heel to shin in bilateral upper and lower extremities Musculoskeletal: Full range of motion in all 4 extremities. No joint swelling, Bilateral below-knee stumps well-healed Without pain to palpation, No edema   Lab Results Last 48 Hours    Results for orders placed or performed during the hospital encounter of 04/17/14 (from the past 48 hour(s))  Glucose, capillary Status: Abnormal   Collection Time: 04/23/14 11:38 AM  Result Value Ref Range   Glucose-Capillary 165 (H) 70 - 99 mg/dL  Glucose, capillary Status: Abnormal   Collection Time: 04/23/14 5:15 PM  Result Value Ref Range   Glucose-Capillary 157 (H) 70 - 99 mg/dL  Glucose, capillary Status: Abnormal   Collection Time: 04/23/14 9:21 PM  Result Value Ref  Range   Glucose-Capillary 128 (H) 70 - 99 mg/dL  Glucose, capillary Status: Abnormal   Collection Time: 04/24/14 7:46 AM  Result Value Ref Range   Glucose-Capillary 121 (H) 70 - 99 mg/dL  Glucose, capillary Status: Abnormal   Collection Time: 04/24/14 11:32 AM  Result Value Ref Range   Glucose-Capillary 171 (H) 70 - 99 mg/dL  CBC Status: Abnormal   Collection Time: 04/24/14 1:01 PM  Result Value Ref Range   WBC 8.4 4.0 - 10.5 K/uL   RBC 3.14 (L) 4.22 - 5.81 MIL/uL   Hemoglobin 9.1 (L) 13.0 - 17.0 g/dL   HCT 28.4 (L) 39.0 - 52.0 %   MCV 90.4 78.0 - 100.0 fL   MCH 29.0 26.0 - 34.0 pg   MCHC 32.0 30.0 - 36.0 g/dL   RDW 14.8 11.5 - 15.5 %   Platelets 212 150 - 400 K/uL  Renal function panel Status: Abnormal   Collection Time: 04/24/14 1:02 PM  Result Value Ref Range   Sodium 135 135 - 145 mmol/L   Potassium 3.9 3.5 - 5.1 mmol/L   Chloride 97 96 - 112 mmol/L   CO2 26 19 - 32 mmol/L   Glucose, Bld 204 (H) 70 - 99 mg/dL   BUN 51 (H) 6 - 23 mg/dL   Creatinine, Ser 6.91 (H) 0.50 - 1.35 mg/dL   Calcium 9.0 8.4 - 10.5 mg/dL   Phosphorus 5.1 (H) 2.3 - 4.6 mg/dL   Albumin 3.0 (L) 3.5 - 5.2 g/dL   GFR calc non Af Amer 7 (L) >90 mL/min   GFR calc Af Amer 8 (L) >90 mL/min    Comment: (NOTE) The eGFR has been calculated using the CKD EPI equation. This calculation has not been validated in all clinical situations. eGFR's persistently <90 mL/min signify possible Chronic Kidney Disease.    Anion gap 12 5 - 15  Glucose, capillary Status: Abnormal   Collection Time: 04/24/14 4:49 PM  Result Value Ref Range   Glucose-Capillary 128 (H) 70 - 99 mg/dL  Glucose, capillary Status: Abnormal   Collection Time: 04/24/14 9:53 PM  Result Value Ref Range   Glucose-Capillary 230 (H) 70 - 99 mg/dL  CBC Status: Abnormal    Collection Time: 04/25/14 6:15 AM  Result Value Ref Range   WBC 7.9 4.0 - 10.5 K/uL   RBC 3.24 (L) 4.22 - 5.81 MIL/uL   Hemoglobin 9.3 (L) 13.0 - 17.0 g/dL   HCT 29.8 (L) 39.0 - 52.0 %   MCV 92.0 78.0 - 100.0 fL   MCH 28.7 26.0 - 34.0 pg   MCHC 31.2 30.0 - 36.0 g/dL   RDW 14.9 11.5 - 15.5 %  Platelets 210 150 - 400 K/uL  Basic metabolic panel Status: Abnormal   Collection Time: 04/25/14 6:15 AM  Result Value Ref Range   Sodium 140 135 - 145 mmol/L   Potassium 4.1 3.5 - 5.1 mmol/L   Chloride 99 96 - 112 mmol/L   CO2 28 19 - 32 mmol/L   Glucose, Bld 178 (H) 70 - 99 mg/dL   BUN 26 (H) 6 - 23 mg/dL   Creatinine, Ser 5.18 (H) 0.50 - 1.35 mg/dL   Calcium 8.9 8.4 - 10.5 mg/dL   GFR calc non Af Amer 10 (L) >90 mL/min   GFR calc Af Amer 11 (L) >90 mL/min    Comment: (NOTE) The eGFR has been calculated using the CKD EPI equation. This calculation has not been validated in all clinical situations. eGFR's persistently <90 mL/min signify possible Chronic Kidney Disease.    Anion gap 13 5 - 15  Glucose, capillary Status: Abnormal   Collection Time: 04/25/14 8:07 AM  Result Value Ref Range   Glucose-Capillary 155 (H) 70 - 99 mg/dL      Imaging Results (Last 48 hours)    No results found.       Medical Problem List and Plan: 1. Functional deficits secondary to Debility due to renal failure new HD in a patient with recent BKA's 2. DVT Prophylaxis/Anticoagulation: Pharmaceutical: Lovenox 3. Neuropathy/Pain Management: Continue lyrica bid 4. Mood: Team to provide ego support. LCSW to follow for evaluation and support.  5. Neuropsych: This patient is capable of making decisions on his own behalf. 6. Skin/Wound Care: Routine pressure relief measures. Monitor graft site for healing.  7. Fluids/Electrolytes/Nutrition: 1200 cc/FR daily to avoid overload. Educate  patient and family on renal diet.  8. DM type 2: Continue Levemir daily with SSI for elevated BS. Will monitor BS with achs checks.  9. HTN: monitor BP every 8 hours. Continue Norvasc daily, cardura daily and apresoline tid.  10. Anemia of chronic disease: On aranesp daily 11. ESRD: To continue HD on TTS. Schedule dialysis sessions at end of day to help with tolerance and manage post procedure fatigue.      Post Admission Physician Evaluation: 1. Functional deficits secondary to Debility secondary to acute on chronic renal failure, bilateral below-knee amputations. 2. Patient is admitted to receive collaborative, interdisciplinary care between the physiatrist, rehab nursing staff, and therapy team. 3. Patient's level of medical complexity and substantial therapy needs in context of that medical necessity cannot be provided at a lesser intensity of care such as a SNF. 4. Patient has experienced substantial functional loss from his/her baseline which was documented above under the "Functional History" and "Functional Status" headings. Judging by the patient's diagnosis, physical exam, and functional history, the patient has potential for functional progress which will result in measurable gains while on inpatient rehab. These gains will be of substantial and practical use upon discharge in facilitating mobility and self-care at the household level. 5. Physiatrist will provide 24 hour management of medical needs as well as oversight of the therapy plan/treatment and provide guidance as appropriate regarding the interaction of the two. 6. 24 hour rehab nursing will assist with bladder management, bowel management, safety, skin/wound care, disease management, medication administration, pain management and patient education and help integrate therapy concepts, techniques,education, etc. 7. PT will assess and treat for/with: pre gait, gait training, endurance , safety, equipment, neuromuscular re  education. Goals are: Supervision with bilateral prosthetic use. 8. OT will assess and treat for/with: ADLs, Cognitive perceptual  skills, Neuromuscular re education, safety, endurance, equipment. Goals are: Supervision to modified independent. Therapy may proceed with showering this patient. 9. SLP will assess and treat for/with: NA. Goals are: NA. 10. Case Management and Social Worker will assess and treat for psychological issues and discharge planning. 11. Team conference will be held weekly to assess progress toward goals and to determine barriers to discharge. 12. Patient will receive at least 3 hours of therapy per day at least 5 days per week. 13. ELOS: 7d  14. Prognosis: excellent     Charlett Blake M.D. [facility] FAAPM&R (Sports Med, Neuromuscular Med) Diplomate Am Board of Electrodiagnostic Med  04/25/2014

## 2014-04-25 NOTE — Progress Notes (Addendum)
An inpt rehab bed is available for this pt today. I have placed a call to Dr. Erlinda Hong to clarify medical readiness to admit today. I await return call. RN CM and SW are aware. [phone] Pt to be d/c to inpt rehab today. Pt in agreement and his wife contacted and aware. I will make the arrangements. [phone]

## 2014-04-26 ENCOUNTER — Inpatient Hospital Stay (HOSPITAL_COMMUNITY): Payer: Medicare Other | Admitting: Occupational Therapy

## 2014-04-26 ENCOUNTER — Inpatient Hospital Stay (HOSPITAL_COMMUNITY): Payer: Medicare Other | Admitting: *Deleted

## 2014-04-26 DIAGNOSIS — R5381 Other malaise: Principal | ICD-10-CM

## 2014-04-26 DIAGNOSIS — Z992 Dependence on renal dialysis: Secondary | ICD-10-CM

## 2014-04-26 DIAGNOSIS — N186 End stage renal disease: Secondary | ICD-10-CM

## 2014-04-26 DIAGNOSIS — Z89519 Acquired absence of unspecified leg below knee: Secondary | ICD-10-CM

## 2014-04-26 LAB — RENAL FUNCTION PANEL
ANION GAP: 14 (ref 5–15)
Albumin: 3.1 g/dL — ABNORMAL LOW (ref 3.5–5.2)
BUN: 52 mg/dL — AB (ref 6–23)
CO2: 25 mmol/L (ref 19–32)
Calcium: 8.9 mg/dL (ref 8.4–10.5)
Chloride: 97 mmol/L (ref 96–112)
Creatinine, Ser: 7.96 mg/dL — ABNORMAL HIGH (ref 0.50–1.35)
GFR calc Af Amer: 7 mL/min — ABNORMAL LOW (ref >90)
GFR calc non Af Amer: 6 mL/min — ABNORMAL LOW (ref >90)
GLUCOSE: 177 mg/dL — AB (ref 70–99)
PHOSPHORUS: 6.7 mg/dL — AB (ref 2.3–4.6)
Potassium: 4 mmol/L (ref 3.5–5.1)
Sodium: 136 mmol/L (ref 135–145)

## 2014-04-26 LAB — CBC
HEMATOCRIT: 28.7 % — AB (ref 39.0–52.0)
HEMOGLOBIN: 9 g/dL — AB (ref 13.0–17.0)
MCH: 28.6 pg (ref 26.0–34.0)
MCHC: 31.4 g/dL (ref 30.0–36.0)
MCV: 91.1 fL (ref 78.0–100.0)
Platelets: 233 10*3/uL (ref 150–400)
RBC: 3.15 MIL/uL — AB (ref 4.22–5.81)
RDW: 14.7 % (ref 11.5–15.5)
WBC: 9.2 10*3/uL (ref 4.0–10.5)

## 2014-04-26 LAB — GLUCOSE, CAPILLARY
GLUCOSE-CAPILLARY: 224 mg/dL — AB (ref 70–99)
Glucose-Capillary: 192 mg/dL — ABNORMAL HIGH (ref 70–99)
Glucose-Capillary: 116 mg/dL — ABNORMAL HIGH (ref 70–99)
Glucose-Capillary: 96 mg/dL (ref 70–99)

## 2014-04-26 MED ORDER — PENTAFLUOROPROP-TETRAFLUOROETH EX AERO: 1.0000 "application " | INHALATION_SPRAY | CUTANEOUS | Status: DC | PRN

## 2014-04-26 MED ORDER — NEPRO/CARBSTEADY PO LIQD
237.0000 mL | ORAL | Status: DC | PRN
  Filled 2014-04-26: qty 237

## 2014-04-26 MED ORDER — HEPARIN SODIUM (PORCINE) 1000 UNIT/ML DIALYSIS: 20.0000 [IU]/kg | INTRAMUSCULAR | Status: DC | PRN

## 2014-04-26 MED ORDER — LIDOCAINE HCL (PF) 1 % IJ SOLN: 5.0000 mL | INTRAMUSCULAR | Status: DC | PRN

## 2014-04-26 MED ORDER — HEPARIN SODIUM (PORCINE) 1000 UNIT/ML DIALYSIS: 1000.0000 [IU] | INTRAMUSCULAR | Status: DC | PRN

## 2014-04-26 MED ORDER — ALTEPLASE 2 MG IJ SOLR
2.0000 mg | Freq: Once | INTRAMUSCULAR | Status: DC | PRN
  Filled 2014-04-26: qty 2

## 2014-04-26 MED ORDER — LIDOCAINE-PRILOCAINE 2.5-2.5 % EX CREA
1.0000 "application " | TOPICAL_CREAM | CUTANEOUS | Status: DC | PRN
  Filled 2014-04-26: qty 5

## 2014-04-26 MED ORDER — DOXERCALCIFEROL 4 MCG/2ML IV SOLN
INTRAVENOUS | Status: AC
  Filled 2014-04-26: qty 2

## 2014-04-26 MED ORDER — SODIUM CHLORIDE 0.9 % IV SOLN: 100.0000 mL | INTRAVENOUS | Status: DC | PRN

## 2014-04-26 MED ORDER — DARBEPOETIN ALFA 60 MCG/0.3ML IJ SOSY
PREFILLED_SYRINGE | INTRAMUSCULAR | Status: AC
  Filled 2014-04-26: qty 0.3

## 2014-04-26 NOTE — Plan of Care (Signed)
Problem: Food- and Nutrition-Related Knowledge Deficit (NB-1.1) Goal: Nutrition education Formal process to instruct or train a patient/client in a skill or to impart knowledge to help patients/clients voluntarily manage or modify food choices and eating behavior to maintain or improve health. Outcome: Completed/Met Date Met:  04/26/14 Nutrition Education Note  RD consulted for Renal Education. Provided Choose-A-Meal Booklet to patient/family. Pt reports she will give the booklet to his wife when she gets off work to review. Reviewed food groups and provided written recommended serving sizes specifically determined for patient's current nutritional status.   Explained why diet restrictions are needed and provided lists of foods to limit/avoid that are high potassium, sodium, and phosphorus. Provided specific recommendations on safer alternatives of these foods. Strongly encouraged compliance of this diet.   Discussed importance of protein intake at each meal and snack. Provided examples of how to maximize protein intake throughout the day. Discussed need for fluid restriction with dialysis, and renal-friendly beverage options. Teach back method used.  Expect good compliance.  Current diet order is renal with 1200 ml fluids, patient is consuming approximately 75-100% of meals at this time. Pt with weight loss, however is associated with fluid loss due to initiation of dialysis. Labs and medications reviewed. Pt currently has Nepro shake ordered once daily and has been consuming them. No further nutrition interventions warranted at this time. RD contact information provided. If additional nutrition issues arise, please re-consult RD.  Kallie Locks, MS, RD, LDN Pager # [phone] After hours/ weekend pager # [phone]

## 2014-04-26 NOTE — Progress Notes (Signed)
[place] PHYSICAL MEDICINE & REHABILITATION     PROGRESS NOTE    Subjective/Complaints: Had a reasonable night. Denies pain. Pt denies nausea, vomiting, abdominal pain, diarrhea, chest pain, shortness of breath, palpitations, dizziness   Objective: Vital Signs: Blood pressure 124/54, pulse 64, temperature 98.7 F (37.1 C), temperature source Oral, resp. rate 18, SpO2 97 %. No results found.  Recent Labs  04/24/14 1301 04/25/14 0615  WBC 8.4 7.9  HGB 9.1* 9.3*  HCT 28.4* 29.8*  PLT 212 210    Recent Labs  04/24/14 1302 04/25/14 0615  NA 135 140  K 3.9 4.1  CL 97 99  GLUCOSE 204* 178*  BUN 51* 26*  CREATININE 6.91* 5.18*  CALCIUM 9.0 8.9   CBG (last 3)   Recent Labs  04/25/14 1653 04/25/14 2110 04/26/14 0656  GLUCAP 151* 201* 192*    Wt Readings from Last 3 Encounters:  04/24/14 83.9 kg (184 lb 15.5 oz)  04/08/14 91.717 kg (202 lb 3.2 oz)  04/02/14 95.255 kg (210 lb)    Physical Exam:  General: No acute distress,  Mood and affect are appropriate Heart: Regular rate and rhythm no rubs murmurs Lungs: Clear to auscultation, breathing unlabored, no rales or wheezes Abdomen: Positive bowel sounds, soft nontender to palpation, nondistended Extremities: No clubbing, cyanosis, or edema Skin: No evidence of breakdown, no evidence of rash Neurologic: Cranial nerves II through XII intact, motor strength isSensory exam normal sensation to light touch and proprioception in bilateral upperextremities , Lower extremities not tested secondary to amputations,UE 4- deltoid, 4/5 biceps, tricep, HI LE: 3/5 HF 4/5 ke. Some pain along left knee persists. Cerebellar exam normal finger to nose to finger as well as heel to shin in bilateral upper and lower extremities Musculoskeletal: Full range of motion in all 4 extremities. No joint swelling, Bilateral below-knee stumps well-healed Without pain to palpation, No edema Psych: appears to be in better spirits  today.  Assessment/Plan: 1. Functional deficits secondary to debility due to renal failure and multiple medical which require 3+ hours per day of interdisciplinary therapy in a comprehensive inpatient rehab setting. Physiatrist is providing close team supervision and 24 hour management of active medical problems listed below. Physiatrist and rehab team continue to assess barriers to discharge/monitor patient progress toward functional and medical goals.  Aaron Long had just resumed outpt PT for prosthetic gait training prior to this hospitalization. He had been able to transfer somewhat with the prosthetics, but he hadn'Long been walking at home. Focus should be on transfers using prosthetics (if possible) but probably not gait while here on rehab.    FIM:                                   Medical Problem List and Plan: 1. Functional deficits secondary to Debility due to renal failure new HD in a patient with recent BKA's 2. DVT Prophylaxis/Anticoagulation: Pharmaceutical: Lovenox 3. Neuropathy/Pain Management: Continue lyrica bid 4. Mood: Team to provide ego support. LCSW to follow for evaluation and support.  5. Neuropsych: This patient is capable of making decisions on his own behalf. 6. Skin/Wound Care: Routine pressure relief measures. Monitor graft site for healing.  7. Fluids/Electrolytes/Nutrition: 1200 cc/FR daily to avoid overload.  Dietary ed.  8. DM type 2: Continue Levemir daily with SSI for elevated BS. Will monitor BS with achs checks.   -follow for pattern 9. HTN: monitor BP every 8 hours.  Continue Norvasc daily, cardura daily and apresoline tid.  10. Anemia of chronic disease: On aranesp daily 11. ESRD: To continue HD on TTS. Schedule dialysis sessions at end of day to help with tolerance and manage post procedure fatigue LOS (Days) 1 A FACE TO FACE EVALUATION WAS PERFORMED  Aaron Long 04/26/2014 8:35 AM

## 2014-04-26 NOTE — Progress Notes (Signed)
Patient information reviewed and entered into eRehab system by Dea Bitting, RN, CRRN, PPS Coordinator.  Information including medical coding and functional independence measure will be reviewed and updated through discharge.     Per nursing patient was given "Data Collection Information Summary for Patients in Inpatient Rehabilitation Facilities with attached "Privacy Act Statement-Health Care Records" upon admission.  

## 2014-04-26 NOTE — Care Management Note (Signed)
[place] Individual Statement of Services  Patient Name:  Aaron Long  Date:  04/26/2014  Welcome to the [facility].  Our goal is to provide you with an individualized program based on your diagnosis and situation, designed to meet your specific needs.  With this comprehensive rehabilitation program, you will be expected to participate in at least 3 hours of rehabilitation therapies Monday-Friday, with modified therapy programming on the weekends.  Your rehabilitation program will include the following services:  Physical Therapy (PT), Occupational Therapy (OT), 24 hour per day rehabilitation nursing, Therapeutic Recreaction (TR), Neuropsychology, Case Management (Social Worker), Rehabilitation Medicine, Nutrition Services and Pharmacy Services  Weekly team conferences will be held on Tuesdays to discuss your progress.  Your Social Worker will talk with you frequently to get your input and to update you on team discussions.  Team conferences with you and your family in attendance may also be held.  Expected length of stay: 7 days  Overall anticipated outcome: Modified independent  Depending on your progress and recovery, your program may change. Your Social Worker will coordinate services and will keep you informed of any changes. Your Social Worker's name and contact numbers are listed  below.  The following services may also be recommended but are not provided by the [facility]:   [facility] will be made to provide these services after discharge if needed.  Arrangements include referral to agencies that provide these services.  Your insurance has been verified to be:  Medicare and [place] Your primary doctor is:  Sherrie Mustache  Pertinent information will be shared with your doctor and your insurance company.  Social Worker:  [place], [place] or [phone]   Information discussed with and copy given to patient by: Lennart Pall, 04/26/2014, 2:13 PM

## 2014-04-26 NOTE — Progress Notes (Signed)
Occupational Therapy Session Note  Patient Details  Name: Aaron Long MRN: [id number] Date of Birth: 12/08/1940  Today's Date: 04/26/2014 OT Individual Time: 1600-1630  OT Individual Time Calculation (min):30 min    Short Term Goals: Week 1:  OT Short Term Goal 1 (Week 1): STG=LTG  Skilled Therapeutic Interventions/Progress Updates:  Balance/  therapeutic Activities;Therapeutic Exercise;UE/LE Strength taining/ROM;UE/LE Coordination activities.  Pt. Lying bed upon OT arrival.  Pt sat in long sitting for increased sitting balance while engaging in UE AROM exercises in all planes. Performed 3 sets of 10.  Pr. Voiced frustration at his progress forward and then going backward.  Offered comforting words and encouragement.  Left pt with all needs in reach.  Therapy Documentation Precautions:  Precautions Precautions: Fall Precaution Comments: bil BKA, new AV fitula RUE Required Braces or Orthoses: Other Brace/Splint (bil) Other Brace/Splint: bil proestheses Restrictions Weight Bearing Restrictions: No Other Position/Activity Restrictions: New AV fistula in R UE General: General Chart Reviewed: Yes Additional Pertinent History: Pt with B BKAs in 2013 and 2014. Reports decreased visual accuity from baseline. R UE limited in function due to dialysis grafting Vital Signs:  Pain:  none             See FIM for current functional status  Therapy/Group: Individual Therapy  Lisa Roca 04/26/2014, 4:34 PM

## 2014-04-26 NOTE — Interval H&P Note (Signed)
Aaron Long was admitted today to Inpatient Rehabilitation with the diagnosis of deconditioning with prosthetic training for bilateral BKAs.  [name] history has been reviewed, patient examined, and there is no change in status.  Patient continues to be appropriate for intensive inpatient rehabilitation.  I have reviewed the patient's chart and labs.  Questions were answered to the patient's satisfaction.  Charlett Blake 04/26/2014, 6:53 AM

## 2014-04-26 NOTE — Progress Notes (Signed)
Assessment: 1.New ESRD. (OP slot at [facility] on TTS at 11) 2. Hyperkalemia, resolved 3. DM 4. Sec HPTH PTH 455 on hectorol 5. Anemia on aranesp Plan:  1 HD tonight 2 Rehab therapies  Subjective: Interval History: First day on rehab   Objective: Vital signs in last 24 hours: Temp:  [98.7 F (37.1 C)] 98.7 F (37.1 C) (04/15 1416) Pulse Rate:  [64-70] 70 (04/15 1416) Resp:  [18-20] 20 (04/15 1416) BP: (108-151)/(45-59) 151/56 mmHg (04/15 1416) SpO2:  [97 %-99 %] 99 % (04/15 1416) Weight change:   Intake/Output from previous day: 04/14 0701 - 04/15 0700 In: 60 [P.O.:60] Out: -  Intake/Output this shift: Total I/O In: 480 [P.O.:480] Out: -   General appearance: alert and cooperative Resp: clear to auscultation bilaterally Cardio: regular rate and rhythm, S1, S2 normal, no murmur, click, rub or gallop Extremities: bilat BKAs  Lab Results:  Recent Labs  04/24/14 1301 04/25/14 0615  WBC 8.4 7.9  HGB 9.1* 9.3*  HCT 28.4* 29.8*  PLT 212 210   BMET:  Recent Labs  04/24/14 1302 04/25/14 0615  NA 135 140  K 3.9 4.1  CL 97 99  CO2 26 28  GLUCOSE 204* 178*  BUN 51* 26*  CREATININE 6.91* 5.18*  CALCIUM 9.0 8.9   No results for input(s): PTH in the last 72 hours. Iron Studies: No results for input(s): IRON, TIBC, TRANSFERRIN, FERRITIN in the last 72 hours. Studies/Results: No results found.  Scheduled: . amLODipine  10 mg Oral Daily  . aspirin EC  81 mg Oral q morning - 10a  . clopidogrel  75 mg Oral Daily  . darbepoetin (ARANESP) injection - DIALYSIS  60 mcg Intravenous Q Fri-HD  . doxazosin  1 mg Oral QHS  . doxercalciferol  2 mcg Intravenous Q M,W,F-HD  . enoxaparin (LOVENOX) injection  30 mg Subcutaneous Q24H  . feeding supplement (NEPRO CARB STEADY)  237 mL Oral Q1500  . hydrALAZINE  100 mg Oral 3 times per day  . insulin aspart  0-9 Units Subcutaneous TID WC  . insulin aspart  3 Units Subcutaneous TID WC  . insulin detemir  15 Units Subcutaneous  Daily  . multivitamin  1 tablet Oral QHS  . pantoprazole  20 mg Oral Daily  . pravastatin  20 mg Oral Daily  . pregabalin  75 mg Oral Daily  . QUEtiapine  25 mg Oral Daily  . senna-docusate  2 tablet Oral BID     LOS: 1 day   Davidson Palmieri C 04/26/2014,5:33 PM

## 2014-04-26 NOTE — H&P (View-Only) (Signed)
Physical Medicine and Rehabilitation Admission H&P   Chief Complaint  Patient presents with  . debility due to renal failure new HD in a patient with recent BKA's    HPI: Aaron Long is a 74 y.o. male with history of HTN, CAD, CKD, B-BKA, chronic pain; who has been reluctant to get HD in the past. He was admitted on 04/17/14 with syncope, tremors and recurrent hyperkalemia. Patient with Scr 7.1 and K-6.5. CT head negative for acute changes. He was treated with Kayexalate, insulin and lasix and was agreeable to start to HD. Dialysis catheter and AV fistula created on 04/08 by Dr. Kellie Simmering and HD initiated. Patient has had improvement in energy levels and po intake and nighttime confusion is resolving. Therapy ongoing and patient limited by RUE pain with limited use as well as deconditioning. PTA- he was undergoing outpatient therapy for prosthetic training and ambulating ~ 91' with min/guard assist PTA. CIR recommended by MD  Sleeping but awakens easily, states he's had no problems with his prostheses thus far  ROS Drowsy, no Respiratory issues, no bowel or bladder problems except for constipation. Mild surgical pain in the Right upper extremity   Past Medical History  Diagnosis Date  . PAD (peripheral artery disease)     a. R CEA 2007. b. Hx L BKA in 2013. c. s/p LE angioplasty in 2014. d. Hx R BKA in 2014.  . Gangrene of toe     dry  . Neuromuscular disorder     diabetic neuropathy  . Hypertension   . Hyperlipidemia   . Peripheral neuropathy   . Constipation     takes Miralax daily  . Hemorrhoids   . Hx of colonic polyps   . Coronary artery disease     a. s/p NSTEMI/CABGx4 in 2007 - LIMA-LAD, SVG-optional diagonal, SVVG-OM, SVG-dRCA. b. Nuc 05/2011: nonischemic.  [name] BPH (benign prostatic hypertrophy)   . Abnormality of gait   . DVT (deep venous thrombosis)     a. Lower extremity DVT in 2013.  [name]  Hypertrophy of prostate without urinary obstruction and other lower urinary tract symptoms (LUTS)   . Disorder of bone and cartilage, unspecified   . Lower limb amputation, below knee   . Anemia   . Secondary hyperparathyroidism     Secondary Hyperpara- Thyroidism, Renal  . Ischemic cardiomyopathy     a. EF 40% in 2007. b. 51% by nuc 05/2011.  [name] Complication of anesthesia     "he gets delirious"  . Myocardial infarction 2005  . Type II diabetes mellitus   . Diabetic peripheral neuropathy   . ESRD (end stage renal disease)     "not ready yet for dialysis" (04/17/2014)  . Kidney stones   . Prostate cancer 2010    Past Surgical History  Procedure Laterality Date  . Prostatectomy  2009  . Knee cartilage surgery Left 1964  . Coronary artery bypass graft  2005    CABG X4  . Carotid endarterectomy Right 2005  . Cataract extraction w/ intraocular lens implant, bilateral Bilateral 2011  . Colonoscopy    . Cardiac catheterization  05/28/11  . Amputation  06/14/2011    Procedure: AMPUTATION DIGIT; Surgeon: Elam Dutch, MD; Location: [facility] OR; Service: Vascular; Laterality: Left; Amputation Left fifth toe  . Amputation  06/16/2011    Procedure: AMPUTATION BELOW KNEE; Surgeon: Elam Dutch, MD; Location: [place]; Service: Vascular; Laterality: Left;  . I&d extremity  01/31/2012    Procedure: IRRIGATION AND DEBRIDEMENT EXTREMITY;  Surgeon: Elam Dutch, MD; Location: [facility] OR; Service: Vascular; Laterality: Left; I & D Left BKA   . Amputation Right 06/12/2012    Procedure: AMPUTATION DIGIT; Surgeon: Elam Dutch, MD; Location: [facility] OR; Service: Vascular; Laterality: Right; GREAT TOE  . Amputation Right 08/08/2012    Procedure: AMPUTATION BELOW KNEE; Surgeon: Elam Dutch, MD; Location: [facility] OR; Service: Vascular; Laterality: Right;  . Abdominal  aortagram N/A 05/28/2011    Procedure: ABDOMINAL Maxcine Ham; Surgeon: Elam Dutch, MD; Location: [facility] CATH LAB; Service: Cardiovascular; Laterality: N/A;  . Abdominal aortagram N/A 06/02/2012    Procedure: ABDOMINAL Maxcine Ham; Surgeon: Elam Dutch, MD; Location: [facility] CATH LAB; Service: Cardiovascular; Laterality: N/A;  . Abdominal aortagram N/A 06/09/2012    Procedure: ABDOMINAL Maxcine Ham; Surgeon: Elam Dutch, MD; Location: [facility] CATH LAB; Service: Cardiovascular; Laterality: N/A;  . Insertion of dialysis catheter Right 04/19/2014    Procedure: INSERTION OF DIALYSIS CATHETER-INTERNAL JUGULAR; Surgeon: Mal Misty, MD; Location: [place]; Service: Vascular; Laterality: Right;  . Av fistula placement Right 04/19/2014    Procedure: RIGHT ARM ARTERIOVENOUS (AV) FISTULA CREATION; Surgeon: Mal Misty, MD; Location: [facility] OR; Service: Vascular; Laterality: Right;    Family History  Problem Relation Age of Onset  . Coronary artery disease Neg Hx   . Anesthesia problems Neg Hx   . Hypotension Neg Hx   . Malignant hyperthermia Neg Hx   . Pseudochol deficiency Neg Hx   . Hyperlipidemia Mother   . Hypertension Mother   . Cancer Mother   . Hyperlipidemia Father   . Hypertension Father   . Kidney disease Father   . Heart disease Sister   . Alzheimer's disease Sister   . Cancer Brother   . Other Sister     Social History: Married. Independent PTA. Wife works days. Per reports that he quit smoking about 23 years ago. His smoking use included Cigarettes. He has a 30 pack-year smoking history. He has never used smokeless tobacco. Per reports that he drinks alcohol. He reports that he does not use illicit drugs.    Allergies  Allergen Reactions  . Oxycodone Other (See Comments)    Hallucinations    Medications Prior to Admission  Medication Sig Dispense  Refill  . AMBULATORY NON FORMULARY MEDICATION BD U/P Mini Pen [place] 31GX5MM Use three times daily as directed DX:250.00 100 each 11  . AMBULATORY NON FORMULARY MEDICATION One Touch Ultra 2 Test Strips Sig: Check blood sugars Three times daily to keep blood sugars under control Dx: 250.00, 401.9 100 strip 11  . amLODipine (NORVASC) 10 MG tablet take 1 tablet by mouth once daily 90 tablet 1  . aspirin EC 81 MG tablet Take 1 tablet (81 mg total) by mouth every morning.    . clopidogrel (PLAVIX) 75 MG tablet take 1 tablet by mouth once daily 90 tablet 1  . docusate sodium (COLACE) 100 MG capsule Take 1 capsule (100 mg total) by mouth 2 (two) times daily. 10 capsule 0  . doxazosin (CARDURA) 1 MG tablet take 1 tablet by mouth at bedtime 90 tablet 1  . hydrALAZINE (APRESOLINE) 100 MG tablet Take 1 tablet (100 mg total) by mouth every 8 (eight) hours. 90 tablet 0  . HYDROcodone-acetaminophen (NORCO/VICODIN) 5-325 MG per tablet Take 1 tablet by mouth every 12 (twelve) hours as needed for moderate pain. 60 tablet 0  . insulin aspart (NOVOLOG) 100 UNIT/ML FlexPen Inject 5 Units into the skin 3 (three) times daily with meals. Inject 5 units  three times a day with meals if blood sugar greater than 150 only, DX: 250.71    . Insulin Detemir (LEVEMIR) 100 UNIT/ML Pen Inject 22 Units into the skin at bedtime. 15 mL 3  . metoprolol succinate (TOPROL-XL) 25 MG 24 hr tablet Take 1 tablet (25 mg total) by mouth daily. 90 tablet 1  . Misc. Devices [facility]) [place] Patient needs new Wheelchair due to his being broken. 1 each 0  . pantoprazole (PROTONIX) 20 MG tablet Take 1 tablet (20 mg total) by mouth daily. 90 tablet 1  . pravastatin (PRAVACHOL) 20 MG tablet Take 1 tablet (20 mg total) by mouth daily. 90 tablet 3  . pregabalin (LYRICA) 75 MG capsule Take 1 capsule (75 mg total) by mouth daily. (Patient taking differently: Take 75 mg by  mouth 2 (two) times daily. ) 30 capsule 0  . sodium polystyrene (KAYEXALATE) 15 GM/60ML suspension Take 60 mLs (15 g total) by mouth 2 (two) times daily. One day (Patient not taking: Reported on 04/17/2014) 120 mL 0    Home: Home Living Family/patient expects to be discharged to:: Private residence Living Arrangements: Spouse/significant other, Other relatives Available Help at Discharge: Family, Other (Comment) (not sure reliability of family help) Type of Home: Apartment Home Access: Level entry Home Layout: One level Home Equipment: Wheelchair - manual, Environmental consultant - 2 wheels, Other (comment) (sliding board for car transfers)  Functional History: Prior Function Level of Independence: Needs assistance Gait / Transfers Assistance Needed: Currently seeing Outpatient PT for gait/prothesis training Comments: was able to transfer independently with wheelchair transfers  Functional Status:  Mobility: Bed Mobility Overal bed mobility: Needs Assistance Bed Mobility: Rolling, Sidelying to Sit Rolling: Min assist Sidelying to sit: Min assist Sit to supine: Supervision General bed mobility comments: Decreased use of R UE. provided assistance under right elbow for rolling into L sidelying and sidelying to sit.  Transfers Overall transfer level: Needs assistance Equipment used: Right platform walker, 2 person hand held assist (BL prosthesis) Transfers: Sit to/from Stand Sit to Stand: Mod assist, +2 physical assistance General transfer comment: Pt completed 3 sit to stand transfers using BL prosthesis. Level of assistance decreased and time standing increased across the 3 sit to stand trials. Elevating the bed height helped pt stand. pt started to feel pain in his BL LEs during standing and described this as the "same pain" he felt when he was gait training in outpatient PT.       ADL:    Cognition: Cognition Overall Cognitive Status: Within Functional Limits for tasks  assessed Orientation Level: Oriented X4 Cognition Arousal/Alertness: Awake/alert Behavior During Therapy: WFL for tasks assessed/performed Overall Cognitive Status: Within Functional Limits for tasks assessed   Blood pressure 120/52, pulse 74, temperature 98.4 F (36.9 C), temperature source Oral, resp. rate 17, height _0  (1.727 m), weight 83.9 kg (184 lb 15.5 oz), SpO2 98 %. Physical Exam      General: No acute distress,Drowsy but awakens easily Mood and affect are appropriate Heart: Regular rate and rhythm no rubs murmurs or extra sounds Lungs: Clear to auscultation, breathing unlabored, no rales or wheezes Abdomen: Positive bowel sounds, soft nontender to palpation, nondistended Extremities: No clubbing, cyanosis, or edema Skin: No evidence of breakdown, no evidence of rash Neurologic: Cranial nerves II through XII intact, motor strength isSensory exam normal sensation to light touch and proprioception in bilateral upperextremities , Lower extremities not tested secondary to amputations,UE 4- deltoid, 4/5 biceps, tricep, HI LE: 3/5 HF 4/5 ke. Some  pain along left knee Cerebellar exam normal finger to nose to finger as well as heel to shin in bilateral upper and lower extremities Musculoskeletal: Full range of motion in all 4 extremities. No joint swelling, Bilateral below-knee stumps well-healed Without pain to palpation, No edema   Lab Results Last 48 Hours    Results for orders placed or performed during the hospital encounter of 04/17/14 (from the past 48 hour(s))  Glucose, capillary Status: Abnormal   Collection Time: 04/23/14 11:38 AM  Result Value Ref Range   Glucose-Capillary 165 (H) 70 - 99 mg/dL  Glucose, capillary Status: Abnormal   Collection Time: 04/23/14 5:15 PM  Result Value Ref Range   Glucose-Capillary 157 (H) 70 - 99 mg/dL  Glucose, capillary Status: Abnormal   Collection Time: 04/23/14 9:21 PM  Result Value Ref  Range   Glucose-Capillary 128 (H) 70 - 99 mg/dL  Glucose, capillary Status: Abnormal   Collection Time: 04/24/14 7:46 AM  Result Value Ref Range   Glucose-Capillary 121 (H) 70 - 99 mg/dL  Glucose, capillary Status: Abnormal   Collection Time: 04/24/14 11:32 AM  Result Value Ref Range   Glucose-Capillary 171 (H) 70 - 99 mg/dL  CBC Status: Abnormal   Collection Time: 04/24/14 1:01 PM  Result Value Ref Range   WBC 8.4 4.0 - 10.5 K/uL   RBC 3.14 (L) 4.22 - 5.81 MIL/uL   Hemoglobin 9.1 (L) 13.0 - 17.0 g/dL   HCT 28.4 (L) 39.0 - 52.0 %   MCV 90.4 78.0 - 100.0 fL   MCH 29.0 26.0 - 34.0 pg   MCHC 32.0 30.0 - 36.0 g/dL   RDW 14.8 11.5 - 15.5 %   Platelets 212 150 - 400 K/uL  Renal function panel Status: Abnormal   Collection Time: 04/24/14 1:02 PM  Result Value Ref Range   Sodium 135 135 - 145 mmol/L   Potassium 3.9 3.5 - 5.1 mmol/L   Chloride 97 96 - 112 mmol/L   CO2 26 19 - 32 mmol/L   Glucose, Bld 204 (H) 70 - 99 mg/dL   BUN 51 (H) 6 - 23 mg/dL   Creatinine, Ser 6.91 (H) 0.50 - 1.35 mg/dL   Calcium 9.0 8.4 - 10.5 mg/dL   Phosphorus 5.1 (H) 2.3 - 4.6 mg/dL   Albumin 3.0 (L) 3.5 - 5.2 g/dL   GFR calc non Af Amer 7 (L) >90 mL/min   GFR calc Af Amer 8 (L) >90 mL/min    Comment: (NOTE) The eGFR has been calculated using the CKD EPI equation. This calculation has not been validated in all clinical situations. eGFR's persistently <90 mL/min signify possible Chronic Kidney Disease.    Anion gap 12 5 - 15  Glucose, capillary Status: Abnormal   Collection Time: 04/24/14 4:49 PM  Result Value Ref Range   Glucose-Capillary 128 (H) 70 - 99 mg/dL  Glucose, capillary Status: Abnormal   Collection Time: 04/24/14 9:53 PM  Result Value Ref Range   Glucose-Capillary 230 (H) 70 - 99 mg/dL  CBC Status: Abnormal    Collection Time: 04/25/14 6:15 AM  Result Value Ref Range   WBC 7.9 4.0 - 10.5 K/uL   RBC 3.24 (L) 4.22 - 5.81 MIL/uL   Hemoglobin 9.3 (L) 13.0 - 17.0 g/dL   HCT 29.8 (L) 39.0 - 52.0 %   MCV 92.0 78.0 - 100.0 fL   MCH 28.7 26.0 - 34.0 pg   MCHC 31.2 30.0 - 36.0 g/dL   RDW 14.9 11.5 - 15.5 %  Platelets 210 150 - 400 K/uL  Basic metabolic panel Status: Abnormal   Collection Time: 04/25/14 6:15 AM  Result Value Ref Range   Sodium 140 135 - 145 mmol/L   Potassium 4.1 3.5 - 5.1 mmol/L   Chloride 99 96 - 112 mmol/L   CO2 28 19 - 32 mmol/L   Glucose, Bld 178 (H) 70 - 99 mg/dL   BUN 26 (H) 6 - 23 mg/dL   Creatinine, Ser 5.18 (H) 0.50 - 1.35 mg/dL   Calcium 8.9 8.4 - 10.5 mg/dL   GFR calc non Af Amer 10 (L) >90 mL/min   GFR calc Af Amer 11 (L) >90 mL/min    Comment: (NOTE) The eGFR has been calculated using the CKD EPI equation. This calculation has not been validated in all clinical situations. eGFR's persistently <90 mL/min signify possible Chronic Kidney Disease.    Anion gap 13 5 - 15  Glucose, capillary Status: Abnormal   Collection Time: 04/25/14 8:07 AM  Result Value Ref Range   Glucose-Capillary 155 (H) 70 - 99 mg/dL      Imaging Results (Last 48 hours)    No results found.       Medical Problem List and Plan: 1. Functional deficits secondary to Debility due to renal failure new HD in a patient with recent BKA's 2. DVT Prophylaxis/Anticoagulation: Pharmaceutical: Lovenox 3. Neuropathy/Pain Management: Continue lyrica bid 4. Mood: Team to provide ego support. LCSW to follow for evaluation and support.  5. Neuropsych: This patient is capable of making decisions on his own behalf. 6. Skin/Wound Care: Routine pressure relief measures. Monitor graft site for healing.  7. Fluids/Electrolytes/Nutrition: 1200 cc/FR daily to avoid overload. Educate  patient and family on renal diet.  8. DM type 2: Continue Levemir daily with SSI for elevated BS. Will monitor BS with achs checks.  9. HTN: monitor BP every 8 hours. Continue Norvasc daily, cardura daily and apresoline tid.  10. Anemia of chronic disease: On aranesp daily 11. ESRD: To continue HD on TTS. Schedule dialysis sessions at end of day to help with tolerance and manage post procedure fatigue.      Post Admission Physician Evaluation: 1. Functional deficits secondary to Debility secondary to acute on chronic renal failure, bilateral below-knee amputations. 2. Patient is admitted to receive collaborative, interdisciplinary care between the physiatrist, rehab nursing staff, and therapy team. 3. Patient's level of medical complexity and substantial therapy needs in context of that medical necessity cannot be provided at a lesser intensity of care such as a SNF. 4. Patient has experienced substantial functional loss from his/her baseline which was documented above under the "Functional History" and "Functional Status" headings. Judging by the patient's diagnosis, physical exam, and functional history, the patient has potential for functional progress which will result in measurable gains while on inpatient rehab. These gains will be of substantial and practical use upon discharge in facilitating mobility and self-care at the household level. 5. Physiatrist will provide 24 hour management of medical needs as well as oversight of the therapy plan/treatment and provide guidance as appropriate regarding the interaction of the two. 6. 24 hour rehab nursing will assist with bladder management, bowel management, safety, skin/wound care, disease management, medication administration, pain management and patient education and help integrate therapy concepts, techniques,education, etc. 7. PT will assess and treat for/with: pre gait, gait training, endurance , safety, equipment, neuromuscular re  education. Goals are: Supervision with bilateral prosthetic use. 8. OT will assess and treat for/with: ADLs, Cognitive perceptual  skills, Neuromuscular re education, safety, endurance, equipment. Goals are: Supervision to modified independent. Therapy may proceed with showering this patient. 9. SLP will assess and treat for/with: NA. Goals are: NA. 10. Case Management and Social Worker will assess and treat for psychological issues and discharge planning. 11. Team conference will be held weekly to assess progress toward goals and to determine barriers to discharge. 12. Patient will receive at least 3 hours of therapy per day at least 5 days per week. 13. ELOS: 7d  14. Prognosis: excellent     Charlett Blake M.D. [facility] FAAPM&R (Sports Med, Neuromuscular Med) Diplomate Am Board of Electrodiagnostic Med  04/25/2014

## 2014-04-26 NOTE — Evaluation (Signed)
Physical Therapy Assessment and Plan  Patient Details  Name: Aaron Long MRN: [id number] Date of Birth: [birth year]  PT Diagnosis: Contracture of joint: knee, Difficulty walking, Impaired cognition, Muscle weakness and Pain in RUE Rehab Potential: Good ELOS: 10-14 days   Today's Date: 04/26/2014 PT Individual Time: 0910-1010 PT Individual Time Calculation (min): 60 min    Problem List:  Patient Active Problem List   Diagnosis Date Noted  . Physical deconditioning 04/25/2014  . Syncope 04/17/2014  . Uncontrolled hypertension   . Chest pain 04/08/2014  . Acute on chronic renal failure 04/08/2014  . Hypertensive urgency 04/08/2014  . Status post bilateral below knee amputation 04/02/2014  . Other constipation 09/26/2013  . Cervical disc disorder with radiculopathy of cervical region 08/07/2013  . Drug-induced constipation 06/13/2013  . DM type 2, uncontrolled, with renal complications 09/32/6712  . Hypertensive renal disease 06/13/2013  . Encounter for therapeutic drug monitoring 06/13/2013  . DM type 2, uncontrolled, with neuropathy 01/23/2013  . Hyperlipidemia with target LDL less than 100 12/06/2012  . Need for prophylactic vaccination and inoculation against influenza 09/30/2012  . Diabetes mellitus with peripheral vascular disease 09/06/2012  . GERD (gastroesophageal reflux disease) 09/06/2012  . Aftercare following surgery of the circulatory system, [place] 08/03/2012  . Anemia in chronic kidney disease(285.21) 07/24/2012  . Atherosclerosis of native arteries of the extremities with ulceration(440.23) 06/01/2012  . Hyperkalemia 04/24/2012  . Diabetic neuropathy, type II diabetes mellitus 07/27/2011  . Atherosclerosis of native arteries of the extremities with intermittent claudication 07/08/2011  . S/P BKA (below knee amputation) 06/22/2011  . Atherosclerosis of native arteries of the extremities with gangrene 06/10/2011  . CKD (chronic kidney disease) stage 4, GFR 15-29  ml/min 05/02/2011  . HTN (hypertension) 04/28/2011  . CAD (coronary artery disease) 04/28/2011  . PAD (peripheral artery disease) 04/28/2011  . Prostate cancer 04/28/2011    Past Medical History:  Past Medical History  Diagnosis Date  . PAD (peripheral artery disease)     a. R CEA 2007. b. Hx L BKA in 2013. c. s/p LE angioplasty in 2014. d. Hx R BKA in 2014.  . Gangrene of toe     dry  . Neuromuscular disorder     diabetic neuropathy  . Hypertension   . Hyperlipidemia   . Peripheral neuropathy   . Constipation     takes Miralax daily  . Hemorrhoids   . Hx of colonic polyps   . Coronary artery disease     a. s/p NSTEMI/CABGx4 in 2007 - LIMA-LAD, SVG-optional diagonal, SVVG-OM, SVG-dRCA. b. Nuc 05/2011: nonischemic.  [name] BPH (benign prostatic hypertrophy)   . Abnormality of gait   . DVT (deep venous thrombosis)     a. Lower extremity DVT in 2013.  [name] Hypertrophy of prostate without urinary obstruction and other lower urinary tract symptoms (LUTS)   . Disorder of bone and cartilage, unspecified   . Lower limb amputation, below knee   . Anemia   . Secondary hyperparathyroidism     Secondary Hyperpara- Thyroidism, Renal  . Ischemic cardiomyopathy     a. EF 40% in 2007. b. 51% by nuc 05/2011.  [name] Complication of anesthesia     "he gets delirious"  . Myocardial infarction 2005  . Type II diabetes mellitus   . Diabetic peripheral neuropathy   . ESRD (end stage renal disease)     "not ready yet for dialysis" (04/17/2014)  . Kidney stones   . Prostate cancer 2010   Past Surgical History:  Past Surgical History  Procedure Laterality Date  . Prostatectomy  2009  . Knee cartilage surgery Left 1964  . Coronary artery bypass graft  2005    CABG X4  . Carotid endarterectomy Right 2005  . Cataract extraction w/ intraocular lens  implant, bilateral Bilateral 2011  . Colonoscopy    . Cardiac catheterization  05/28/11  . Amputation  06/14/2011    Procedure: AMPUTATION DIGIT;  Surgeon:  Elam Dutch, MD;  Location: [facility] OR;  Service: Vascular;  Laterality: Left;  Amputation Left fifth toe  . Amputation  06/16/2011    Procedure: AMPUTATION BELOW KNEE;  Surgeon: Elam Dutch, MD;  Location: [place];  Service: Vascular;  Laterality: Left;  . I&d extremity  01/31/2012    Procedure: IRRIGATION AND DEBRIDEMENT EXTREMITY;  Surgeon: Elam Dutch, MD;  Location: [place];  Service: Vascular;  Laterality: Left;  I & D Left BKA   . Amputation Right 06/12/2012    Procedure: AMPUTATION DIGIT;  Surgeon: Elam Dutch, MD;  Location: [facility] OR;  Service: Vascular;  Laterality: Right;  GREAT TOE  . Amputation Right 08/08/2012    Procedure: AMPUTATION BELOW KNEE;  Surgeon: Elam Dutch, MD;  Location: [facility] OR;  Service: Vascular;  Laterality: Right;  . Abdominal aortagram N/A 05/28/2011    Procedure: ABDOMINAL Maxcine Ham;  Surgeon: Elam Dutch, MD;  Location: [facility] CATH LAB;  Service: Cardiovascular;  Laterality: N/A;  . Abdominal aortagram N/A 06/02/2012    Procedure: ABDOMINAL Maxcine Ham;  Surgeon: Elam Dutch, MD;  Location: [facility] CATH LAB;  Service: Cardiovascular;  Laterality: N/A;  . Abdominal aortagram N/A 06/09/2012    Procedure: ABDOMINAL Maxcine Ham;  Surgeon: Elam Dutch, MD;  Location[facility] CATH LAB;  Service: Cardiovascular;  Laterality: N/A;  . Insertion of dialysis catheter Right 04/19/2014    Procedure: INSERTION OF DIALYSIS CATHETER-INTERNAL JUGULAR;  Surgeon: Mal Misty, MD;  Location: [place];  Service: Vascular;  Laterality: Right;  . Av fistula placement Right 04/19/2014    Procedure: RIGHT ARM ARTERIOVENOUS (AV) FISTULA CREATION;  Surgeon: Mal Misty, MD;  Location: [place];  Service: Vascular;  Laterality: Right;    Assessment & Plan Clinical Impression:Jani B Kishimoto is a 74 y.o. male with history of HTN, CAD, CKD, B-BKA, chronic pain; who has been reluctant to get HD in the past. He was admitted on 04/17/14 with syncope, tremors and recurrent hyperkalemia. Patient  with Scr 7.1 and K-6.5. CT head negative for acute changes. He was treated with Kayexalate, insulin and lasix and was agreeable to start to HD. Dialysis catheter and AV fistula created on 04/08 by Dr. Kellie Simmering and HD initiated. Patient has had improvement in energy levels and po intake and nighttime confusion is resolving. Therapy ongoing and patient limited by RUE pain with limited use as well as deconditioning. PTA- he was undergoing outpatient therapy for prosthetic training and ambulating ~ 19' with min/guard assist PTA. Patient transferred to CIR on 04/25/2014 .   Patient currently requires max with mobility secondary to muscle weakness and muscle joint tightness, decreased cardiorespiratoy endurance and decreased sitting balance and decreased standing balance.  Prior to hospitalization, patient was modified independent  with mobility and lived with Spouse, Family (nephew) in a [place] home.  Home access is  Level entry.  Patient will benefit from skilled PT intervention to maximize safe functional mobility, minimize fall risk and decrease caregiver burden for planned discharge home with 24 hour supervision.  Anticipate patient will benefit from  follow up OP at discharge.  PT - End of Session Activity Tolerance: Tolerates < 10 min activity, no significant change in vital signs Endurance Deficit: Yes Endurance Deficit Description: Pt fatigue following donning prostheses PT Assessment Rehab Potential (ACUTE/IP ONLY): Good Barriers to Discharge: Decreased caregiver support Barriers to Discharge Comments: will need to verify pt has reliable support at home PT Patient demonstrates impairments in the following area(s): Balance;Endurance;Pain;Safety;Sensory;Skin Integrity PT Transfers Functional Problem(s): Bed Mobility;Bed to Chair;Car;Furniture PT Locomotion Functional Problem(s): Ambulation;Wheelchair Mobility PT Plan PT Intensity: Minimum of 1-2 x/day ,45 to 90 minutes PT Frequency: 5 out of  7 days PT Duration Estimated Length of Stay: 10-14 days PT Treatment/Interventions: Ambulation/gait training;Balance/vestibular training;Cognitive remediation/compensation;Community reintegration;Discharge planning;Disease management/prevention;DME/adaptive equipment instruction;Functional mobility training;Neuromuscular re-education;Pain management;Patient/family education;Psychosocial support;Skin care/wound management;Splinting/orthotics;Therapeutic Activities;Therapeutic Exercise;UE/LE Strength taining/ROM;Wheelchair propulsion/positioning PT Transfers Anticipated Outcome(s): Supervision PT Locomotion Anticipated Outcome(s): Min A with RW PT Recommendation Recommendations for Other Services: Neuropsych consult Follow Up Recommendations: Outpatient PT;24 hour supervision/assistance Patient destination: Home Equipment Recommended: To be determined Equipment Details: Pt may have all equipment needs  Skilled Therapeutic Intervention Skilled PT tx initiated upon evaluation including self-care, sit<>stand transfers, and gait in // bars. Pt up in [facility], needing assist for set-up to don prosthetic limbs. Pt performed functional sitting balance at close S level with safety cues for [facility] management. Pt unable to perform sit>stand at RW with Max A, so reattempted in // bars with better success x4 with max lifting/lowering assist. Pt not wanting to attempt WC propulsion despite OK from PA for this level of activity, so required total A for WC mobility. Pt advised that he can use RUE to tolerance without harming fistula. Pt not wanting to attempt WC propulsion with bil LEs. Performed HS stretch 2x55mn with education on positioning and alignment as well as WC parts management. Performed static and dynamic standing x177m each with min/mod A in // bars with cues and manual facilitation for trunk and hip alignment. Gait training with cues for sequence and safety in // bars. Pt was asked to have [facility],  not provided. [facility] all needs in reach.    PT Evaluation Precautions/Restrictions Precautions Precautions: Fall Precaution Comments: bil BKA, new AV fitula RUE Required Braces or Orthoses: Other Brace/Splint (bil) Other Brace/Splint: bil proestheses Restrictions Weight Bearing Restrictions: No Other Position/Activity Restrictions: New AV fistula in R UE General   Vital SignsTherapy Vitals Temp: 98.7 F (37.1 C) Temp Source: Oral Pulse Rate: 70 Resp: 20 BP: (!) 151/56 mmHg Patient Position (if appropriate): Lying Oxygen Therapy SpO2: 99 % O2 Device: Not Delivered Pain - 5/10 pain, pt premedicated and modified tx   Home Living/Prior Functioning Home Living Available Help at Discharge: Family;Available PRN/intermittently Type of Home: Apartment Home Access: Level entry Home Layout: One level Additional Comments: Over recent months pt has become weaker due to new renal failure. Mod I with RW with B prosthesis. Recently restatred OP rehab due to weakness. Using w/c and wearing prosthesis but only doing transfers due to weakness  Lives With: Spouse;Family (nephew) Prior Function Level of Independence: Requires assistive device for independence  Able to Take Stairs?: No Comments[facility]  [facility]ision/Perception  Vision - History Baseline Vision: Wears glasses all the time  Cognition Overall Cognitive Status: Impaired/Different from baseline Arousal/Alertness: Awake/alert Orientation Level: Oriented to person;Oriented to place;Oriented to time Awareness: Impaired Awareness Impairment: Other (comment) (Pt aparently unaware of why he is in hospital) Problem Solving: Impaired  Problem Solving Impairment: Functional complex Safety/Judgment: Appears intact Sensation Sensation Light Touch: Impaired Detail Light Touch Impaired Details: Impaired RUE (Reports decreased feeling in R hand and forearm since placem) Coordination Gross Motor Movements are Fluid  and Coordinated: Yes Motor  Motor Motor: Within Functional Limits Motor - Skilled Clinical Observations: Pt with decreased functional balance with posterior lean when sitting unsupported during functional tasks.   Mobility Bed Mobility Bed Mobility: Rolling Left;Left Sidelying to Sit;Supine to Sit Left Sidelying to Sit: 3: Mod assist Left Sidelying to Sit Details: Manual facilitation for weight shifting;Verbal cues for technique Left Sidelying to Sit Details (indicate cue type and reason): VCs for technique Supine to Sit: 3: Mod assist;2: Max assist Supine to Sit Details: Verbal cues for technique;Manual facilitation for weight shifting Transfers Transfers: Yes Sit to Stand: 2: Max assist Stand to Sit: 2: Max assist Stand to Sit Details: Max A to lift and lower Locomotion  Ambulation Ambulation: Yes Ambulation/Gait Assistance: 3: Mod assist Ambulation Distance (Feet): 10 Feet Assistive device: Parallel bars Ambulation/Gait Assistance Details: Cues for posture, hip/knee ext, and placement with bil prostheses Stairs / Additional Locomotion Stairs: No Wheelchair Mobility Wheelchair Mobility: Yes Wheelchair Assistance: 1: +1 Total assist (Pt declined due to RUE discomfort) Wheelchair Parts Management: Needs assistance  Trunk/Postural Assessment  Cervical Assessment Cervical Assessment: Within Functional Limits Thoracic Assessment Thoracic Assessment: Within Functional Limits Lumbar Assessment Lumbar Assessment: Within Functional Limits Postural Control Postural Control: Deficits on evaluation (Pt with flexed sitting position and posterior lean when sitting unsupported during dynamic functional tasks. )  Balance Balance Balance Assessed: Yes Static Sitting Balance Static Sitting - Balance Support: No upper extremity supported;Feet supported Static Sitting - Level of Assistance: 5: Stand by assistance Static Sitting - Comment/# of Minutes: Sitting unsupported on EOB without  prosthesis to eat meal with 1 UE used for balance. Functional task completed edge of mat with prothesis donned with SBA and no UE support Dynamic Sitting Balance Dynamic Sitting - Balance Support: Right upper extremity supported;Left upper extremity supported;Feet unsupported;During functional activity Dynamic Sitting - Level of Assistance: 4: Min assist Dynamic Sitting Balance - Compensations: Sitting EOB to complete dressing tasks unable to maintain dynamic sitting balance during functional task with heavy steadying assist Dynamic Sitting - Balance Activities: Lateral lean/weight shifting;Reaching for objects;Reaching across midline;Forward lean/weight shifting Sitting balance - Comments: During dressing task seated EOB with prosthesis doffed Static Standing Balance Static Standing - Balance Support: Bilateral upper extremity supported;During functional activity Static Standing - Level of Assistance: 4: Min assist Static Standing - Comment/# of Minutes: 44mn in // bars Dynamic Standing Balance Dynamic Standing - Balance Support: Bilateral upper extremity supported;During functional activity Dynamic Standing - Level of Assistance: 3: Mod assist Dynamic Standing - Balance Activities: Lateral lean/weight shifting Extremity Assessment  RUE Assessment RUE Assessment: Exceptions to [facility] AROM (degrees) Overall AROM Right Upper Extremity: Deficits;Due to precautions;Due to pain (fistual placed in R UE) RUE Strength RUE Overall Strength: Unable to assess;Due to pain LUE Assessment LUE Assessment: Exceptions to WFL LUE AROM (degrees) Overall AROM Left Upper Extremity:  (Pt reports increased pain  due to recent port placement) LUE Strength LUE Overall Strength: Unable to assess;Due to precautions;Due to pain RLE Assessment RLE Assessment: Exceptions to [facility] (degrees) Right Knee Extension: -13 RLE PROM (degrees) Right Knee Extension: -10 RLE Strength RLE Overall Strength Comments:  WFL 4/5 LLE Assessment LLE Assessment: Exceptions to WFL LLE AROM (degrees) Left Knee Extension: -15 LLE PROM (degrees) Left Knee Extension: -12  LLE Strength LLE Overall Strength Comments: WLF 4/5  FIM:  FIM - Bed/Chair Transfer Bed/Chair Transfer Assistive Devices: Arm rests Bed/Chair Transfer: 2: Bed > Chair or W/C: Max A (lift and lower assist);3: Chair or W/C > Bed: Mod A (lift or lower assist) FIM - Locomotion: Wheelchair Locomotion: Wheelchair: 1: Total Assistance/staff pushes wheelchair (Pt<25%) (Pt declined due to UE pain) FIM - Locomotion: Ambulation Locomotion: Ambulation Assistive Devices: Parallel bars Ambulation/Gait Assistance: 3: Mod assist Locomotion: Ambulation: 1: Travels less than 50 ft with moderate assistance (Pt: 50 - 74%) FIM - Locomotion: Stairs Locomotion: Stairs: 0: Activity did not occur (Unsafe to assess)   Refer to Care Plan for Long Term Goals  Recommendations for other services: Neuropsych  Discharge Criteria: Patient will be discharged from PT if patient refuses treatment 3 consecutive times without medical reason, if treatment goals not met, if there is a change in medical status, if patient makes no progress towards goals or if patient is discharged from hospital.  The above assessment, treatment plan, treatment alternatives and goals were discussed and mutually agreed upon: by patient   Kennieth Rad, PT, DPT  04/26/2014, 4:23 PM

## 2014-04-26 NOTE — Evaluation (Signed)
Occupational Therapy Assessment and Plan  Patient Details  Name: Aaron Long MRN: [id number] Date of Birth: May 07, 1940  OT Diagnosis: abnormal posture, acute pain, disturbance of vision and muscle weakness (generalized) Rehab Potential: Rehab Potential (ACUTE ONLY): Good ELOS: 10-12 days   Today's Date: 04/26/2014 OT Individual Time: 3734-2876 and 1100-1200 OT Individual Time Calculation (min): 120 minute total (Two 60 minute sessions)     Problem List:  Patient Active Problem List   Diagnosis Date Noted  . Physical deconditioning 04/25/2014  . Syncope 04/17/2014  . Uncontrolled hypertension   . Chest pain 04/08/2014  . Acute on chronic renal failure 04/08/2014  . Hypertensive urgency 04/08/2014  . Status post bilateral below knee amputation 04/02/2014  . Other constipation 09/26/2013  . Cervical disc disorder with radiculopathy of cervical region 08/07/2013  . Drug-induced constipation 06/13/2013  . DM type 2, uncontrolled, with renal complications 81/15/7262  . Hypertensive renal disease 06/13/2013  . Encounter for therapeutic drug monitoring 06/13/2013  . DM type 2, uncontrolled, with neuropathy 01/23/2013  . Hyperlipidemia with target LDL less than 100 12/06/2012  . Need for prophylactic vaccination and inoculation against influenza 09/30/2012  . Diabetes mellitus with peripheral vascular disease 09/06/2012  . GERD (gastroesophageal reflux disease) 09/06/2012  . Aftercare following surgery of the circulatory system, [place] 08/03/2012  . Anemia in chronic kidney disease(285.21) 07/24/2012  . Atherosclerosis of native arteries of the extremities with ulceration(440.23) 06/01/2012  . Hyperkalemia 04/24/2012  . Diabetic neuropathy, type II diabetes mellitus 07/27/2011  . Atherosclerosis of native arteries of the extremities with intermittent claudication 07/08/2011  . S/P BKA (below knee amputation) 06/22/2011  . Atherosclerosis of native arteries of the extremities with  gangrene 06/10/2011  . CKD (chronic kidney disease) stage 4, GFR 15-29 ml/min 05/02/2011  . HTN (hypertension) 04/28/2011  . CAD (coronary artery disease) 04/28/2011  . PAD (peripheral artery disease) 04/28/2011  . Prostate cancer 04/28/2011    Past Medical History:  Past Medical History  Diagnosis Date  . PAD (peripheral artery disease)     a. R CEA 2007. b. Hx L BKA in 2013. c. s/p LE angioplasty in 2014. d. Hx R BKA in 2014.  . Gangrene of toe     dry  . Neuromuscular disorder     diabetic neuropathy  . Hypertension   . Hyperlipidemia   . Peripheral neuropathy   . Constipation     takes Miralax daily  . Hemorrhoids   . Hx of colonic polyps   . Coronary artery disease     a. s/p NSTEMI/CABGx4 in 2007 - LIMA-LAD, SVG-optional diagonal, SVVG-OM, SVG-dRCA. b. Nuc 05/2011: nonischemic.  [name] BPH (benign prostatic hypertrophy)   . Abnormality of gait   . DVT (deep venous thrombosis)     a. Lower extremity DVT in 2013.  [name] Hypertrophy of prostate without urinary obstruction and other lower urinary tract symptoms (LUTS)   . Disorder of bone and cartilage, unspecified   . Lower limb amputation, below knee   . Anemia   . Secondary hyperparathyroidism     Secondary Hyperpara- Thyroidism, Renal  . Ischemic cardiomyopathy     a. EF 40% in 2007. b. 51% by nuc 05/2011.  [name] Complication of anesthesia     "he gets delirious"  . Myocardial infarction 2005  . Type II diabetes mellitus   . Diabetic peripheral neuropathy   . ESRD (end stage renal disease)     "not ready yet for dialysis" (04/17/2014)  . Kidney stones   .  Prostate cancer 2010   Past Surgical History:  Past Surgical History  Procedure Laterality Date  . Prostatectomy  2009  . Knee cartilage surgery Left 1964  . Coronary artery bypass graft  2005    CABG X4  . Carotid endarterectomy Right 2005  . Cataract extraction w/ intraocular lens  implant, bilateral Bilateral 2011  . Colonoscopy    . Cardiac catheterization  05/28/11   . Amputation  06/14/2011    Procedure: AMPUTATION DIGIT;  Surgeon: Elam Dutch, MD;  Location: [facility] OR;  Service: Vascular;  Laterality: Left;  Amputation Left fifth toe  . Amputation  06/16/2011    Procedure: AMPUTATION BELOW KNEE;  Surgeon: Elam Dutch, MD;  Location: [place];  Service: Vascular;  Laterality: Left;  . I&d extremity  01/31/2012    Procedure: IRRIGATION AND DEBRIDEMENT EXTREMITY;  Surgeon: Elam Dutch, MD;  Location: [place];  Service: Vascular;  Laterality: Left;  I & D Left BKA   . Amputation Right 06/12/2012    Procedure: AMPUTATION DIGIT;  Surgeon: Elam Dutch, MD;  Location: [facility] OR;  Service: Vascular;  Laterality: Right;  GREAT TOE  . Amputation Right 08/08/2012    Procedure: AMPUTATION BELOW KNEE;  Surgeon: Elam Dutch, MD;  Location: [facility] OR;  Service: Vascular;  Laterality: Right;  . Abdominal aortagram N/A 05/28/2011    Procedure: ABDOMINAL Maxcine Ham;  Surgeon: Elam Dutch, MD;  Location: [facility] CATH LAB;  Service: Cardiovascular;  Laterality: N/A;  . Abdominal aortagram N/A 06/02/2012    Procedure: ABDOMINAL Maxcine Ham;  Surgeon: Elam Dutch, MD;  Location: [facility] CATH LAB;  Service: Cardiovascular;  Laterality: N/A;  . Abdominal aortagram N/A 06/09/2012    Procedure: ABDOMINAL Maxcine Ham;  Surgeon: Elam Dutch, MD;  Location: [facility] CATH LAB;  Service: Cardiovascular;  Laterality: N/A;  . Insertion of dialysis catheter Right 04/19/2014    Procedure: INSERTION OF DIALYSIS CATHETER-INTERNAL JUGULAR;  Surgeon: Mal Misty, MD;  Location: [place];  Service: Vascular;  Laterality: Right;  . Av fistula placement Right 04/19/2014    Procedure: RIGHT ARM ARTERIOVENOUS (AV) FISTULA CREATION;  Surgeon: Mal Misty, MD;  Location: [place];  Service: Vascular;  Laterality: Right;    Assessment & Plan Clinical Impression: MATIAS THURMAN is a 74 y.o. male with history of HTN, CAD, CKD, B-BKA, chronic pain; who has been reluctant to get HD in the past. He was admitted on  04/17/14 with syncope, tremors and recurrent hyperkalemia. Patient with Scr 7.1 and K-6.5. CT head negative for acute changes. He was treated with Kayexalate, insulin and lasix and was agreeable to start to HD. Dialysis catheter and AV fistula created on 04/08 by Dr. Kellie Simmering and HD initiated. Patient has had improvement in energy levels and po intake and nighttime confusion is resolving. Therapy ongoing and patient limited by RUE pain with limited use as well as deconditioning. PTA- he was undergoing outpatient therapy for prosthetic training and ambulating ~ 67' with min/guard assist PTA. CIR recommended by MD    Patient currently requires mod with basic self-care skills secondary to muscle weakness, decreased cardiorespiratoy endurance and decreased sitting balance, decreased standing balance, decreased postural control and decreased balance strategies.  Prior to hospitalization, patient could complete BADLs with min.  Patient will benefit from skilled intervention to decrease level of assist with basic self-care skills and increase independence with basic self-care skills prior to discharge home with care partner.  Anticipate patient will require intermittent supervision and minimal physical  assistance and follow up home health.  OT - End of Session Activity Tolerance: Tolerates 30+ min activity with multiple rests Endurance Deficit: Yes OT Assessment Rehab Potential (ACUTE ONLY): Good Barriers to Discharge: Decreased caregiver support Barriers to Discharge Comments: Pt unable to have 24 hr supervision upon d/c OT Patient demonstrates impairments in the following area(s): Balance;Endurance;Motor;Pain;Perception;Safety;Sensory;Vision OT Basic ADL's Functional Problem(s): Bathing;Dressing;Toileting OT Transfers Functional Problem(s): Toilet;Tub/Shower OT Additional Impairment(s): Fuctional Use of Upper Extremity OT Plan OT Intensity: Minimum of 1-2 x/day, 45 to 90 minutes OT Frequency: 5 out  of 7 days OT Duration/Estimated Length of Stay: 10-12 days OT Treatment/Interventions: Balance/vestibular training;Discharge planning;Community reintegration;DME/adaptive equipment instruction;Neuromuscular re-education;Functional mobility training;Patient/family education;Self Care/advanced ADL retraining;Therapeutic Activities;Therapeutic Exercise;UE/LE Strength taining/ROM;UE/LE Coordination activities OT Self Feeding Anticipated Outcome(s): Mod I OT Basic Self-Care Anticipated Outcome(s): Mod I OT Toileting Anticipated Outcome(s): Mod A OT Bathroom Transfers Anticipated Outcome(s): Min A OT Recommendation Patient destination: Home Follow Up Recommendations: Home health OT Equipment Details: Pt reports having 3-1 BSC and tub transfer bench   Skilled Therapeutic Intervention Session One: Pt seen for OT eval and ADL bathing and dressing session. Pt in supine upon arrival, agreeable to tx. He transferred supine> EOB with mod A and cues for technique. Pt sat unsupported on EOB to eat breakfast. He required assist to read labels on food, stating that he has noticed a decrease in visual acuity since he has begun dialysis. Following breakfast, pt completed LB bathing and dressing in supine position, stating this was his dressing routine PTA. Pt demonstrated decreased functional sitting balance during dressing/bathing routine which required dynamic movements.  Pt then transferred EOB>w/c with max A using sliding board. Pt bathed and dress UB in w/c, requiring assist to pull down shirt due to difficuilty managing clothing around port. Pt completed grooming tasks from seated position with set-up. Pt left in w/c at end of session, all needs in reach and NT made aware.  Session Two: Pt in w/c upon arrival, agreeable to tx. Pt completed toilet transfers and toileting task with +2 assist. He transferred w/c> toilet with use of grab bars, VCs for technique/ safety, and control at the hips and trunk. Pt required  assist for clothing management and hygiene due to decreased sitting balance and impaired use of R UE. Assist for clothing management required as pt able to lift self off toilet in order to have caregiver pull pants up. Pt required steadying assist to lean to pull pants down. Total A required for hygiene. Pt education provided regarding need for assist and recommendation to complete clothing management on toilet following transfers in order to protect skin during transfer. Pt then taken to therapy gym where he completed w/c> therapy mat transfer with max A  Via stand pivot transfer. On EOM, pt completed shoulder arc task, requiring pt to reach forward to access rings. Pt demonstrated functional sitting balance with feet supported during task. Pt then completed bean bag toss activity, requiring pt to catch bean bag and turn to place bean bag on mat behind him. He demonstrated decreased ability catch bean bags due to impaired sensation in R UE. Task completed x1 set with prosthesis donned and x1 set with prosthesis doffed in order to increased LE supported and unsupported functional balance. Pt transferred therapy mat>w/c with sliding board with min A with prothesis donned at end of session. Pt returned to room at end of session, caregivers present and pt let sitting in w/c.   Education provided regarding importance of maintaining skin integrity  during transfers, energy conservation, need for assist, role of OT, POC, and d/c planning.   OT Evaluation Precautions/Restrictions  Precautions Precautions: Fall Precaution Comments: bil BKA, new AV fitula RUE Required Braces or Orthoses: Other Brace/Splint Other Brace/Splint: B proesteses Restrictions Weight Bearing Restrictions: No Other Position/Activity Restrictions: New AV fistula in R UE General Chart Reviewed: Yes Additional Pertinent History: Pt with B BKAs in 2013 and 2014. Reports decreased visual accuity from baseline. R UE limited in function due to  dialysis grafting Pain  No/denies pain Pt experiences pain with functional use of R UE Home Living/Prior Functioning Home Living Available Help at Discharge: Family, Available PRN/intermittently, Other (Comment) Type of Home: Apartment Home Access: Level entry Home Layout: One level  Lives With: Spouse, Other (Comment) IADL History Occupation: Retired Prior Function Level of Independence: Independent with homemaking with wheelchair Vision/Perception  Vision- History Baseline Vision/History: Wears glasses Wears Glasses: Reading only Patient Visual Report: Other (comment) Vision- Assessment Additional Comments: Pt reports decreased visual accuity following first dialysis tx. Stating "things just dont look like they did before"  Cognition Overall Cognitive Status: Within Functional Limits for tasks assessed Orientation Level: Oriented to person;Oriented to place;Oriented to time Awareness: Impaired Problem Solving: Appears intact Sensation Sensation Light Touch: Impaired Detail Light Touch Impaired Details: Impaired RUE (Reports decreased feeling in R hand and forearm since placement of dialysis ) Coordination Gross Motor Movements are Fluid and Coordinated: No Fine Motor Movements are Fluid and Coordinated: No Finger Nose Finger Test: Decreased speed and accuarcy in R UE Motor  Motor Motor - Skilled Clinical Observations: Pt with decreased functional balance with posterior lean when sitting unsupported during functional tasks.  Mobility  Bed Mobility Bed Mobility: Rolling Left;Left Sidelying to Sit;Supine to Sit Left Sidelying to Sit: 3: Mod assist Left Sidelying to Sit Details: Manual facilitation for weight shifting;Verbal cues for technique Left Sidelying to Sit Details (indicate cue type and reason): VCs for technique Supine to Sit: 3: Mod assist;2: Max assist Supine to Sit Details: Verbal cues for technique;Manual facilitation for weight shifting  Trunk/Postural  Assessment  Cervical Assessment Cervical Assessment: Within Functional Limits Thoracic Assessment Thoracic Assessment: Within Functional Limits Lumbar Assessment Lumbar Assessment: Within Functional Limits Postural Control Postural Control: Deficits on evaluation (Pt with flexed sitting position and posterior lean when sitting unsupported during dynamic functional tasks. )  Balance Balance Balance Assessed: Yes Static Sitting Balance Static Sitting - Balance Support: Right upper extremity supported;Left upper extremity supported Static Sitting - Level of Assistance: 5: Stand by assistance Static Sitting - Comment/# of Minutes: Sitting unsupported on EOB without prosthesis to eat meal with 1 UE used for balance. Functional task completed edge of mat with prothesis donned with SBA and no UE support Dynamic Sitting Balance Dynamic Sitting - Balance Support: Feet unsupported Dynamic Sitting - Level of Assistance: 3: Mod assist Dynamic Sitting Balance - Compensations: Sitting EOB to complete dressing tasks unable to maintain dynamic sitting balance during functional task with heavy steadying assist Dynamic Sitting - Balance Activities: Lateral lean/weight shifting;Reaching for objects;Reaching across midline;Forward lean/weight shifting Sitting balance - Comments: During dressing task seated EOB with prosthesis doffed Extremity/Trunk Assessment RUE Assessment RUE Assessment: Exceptions to [facility] RUE AROM (degrees) Overall AROM Right Upper Extremity: Deficits;Due to precautions;Due to pain (fistual placed in R UE) RUE Strength RUE Overall Strength: Unable to assess;Other (Comment) (Unable to assess formally in formal or functional manner due to fistula placement and pain with heavy activity using R UE) LUE Assessment LUE Assessment: Exceptions to  WFL LUE AROM (degrees) Overall AROM Left Upper Extremity: Deficits;Due to pain;Other (comment) (Pt reports increased pain with B shoulder movements  due to recent fisutal and port placement) LUE Strength LUE Overall Strength: Unable to assess;Due to precautions;Due to pain  FIM:  FIM - Grooming Grooming Steps: Wash, rinse, dry face;Oral care, brush teeth, clean dentures Grooming: 5: Set-up assist to obtain items FIM - Bathing Bathing Steps Patient Completed: Front perineal area;Buttocks;Left Arm;Right Arm;Abdomen;Chest Bathing: 4: Steadying assist FIM - Upper Body Dressing/Undressing Upper body dressing/undressing steps patient completed: Thread/unthread right sleeve of pullover shirt/dresss;Thread/unthread left sleeve of pullover shirt/dress;Put head through opening of pull over shirt/dress Upper body dressing/undressing: 4: Min-Patient completed 75 plus % of tasks FIM - Lower Body Dressing/Undressing Lower body dressing/undressing steps patient completed: Pull pants up/down;Thread/unthread right underwear leg;Thread/unthread left underwear leg;Pull underwear up/down;Thread/unthread right pants leg;Thread/unthread left pants leg Lower body dressing/undressing: 4: Steadying Assist FIM - Toileting Toileting steps completed by patient: Adjust clothing prior to toileting Toileting Assistive Devices: Grab bar or rail for support Toileting: 1: Two helpers FIM - Control and instrumentation engineer Devices: Arm rests Bed/Chair Transfer: 2: Bed > Chair or W/C: Max A (lift and lower assist);3: Chair or W/C > Bed: Mod A (lift or lower assist) FIM - Radio producer Devices: Product manager Transfers: 1-Two helpers   Refer to Care Plan for Long Term Goals  Recommendations for other services: None  Discharge Criteria: Patient will be discharged from OT if patient refuses treatment 3 consecutive times without medical reason, if treatment goals not met, if there is a change in medical status, if patient makes no progress towards goals or if patient is discharged from hospital.  The above assessment,  treatment plan, treatment alternatives and goals were discussed and mutually agreed upon: by patient  Ernestina Patches 04/26/2014, 8:27 AM

## 2014-04-27 ENCOUNTER — Inpatient Hospital Stay (HOSPITAL_COMMUNITY): Payer: Medicare Other | Admitting: Physical Therapy

## 2014-04-27 DIAGNOSIS — E1165 Type 2 diabetes mellitus with hyperglycemia: Secondary | ICD-10-CM

## 2014-04-27 DIAGNOSIS — Z89512 Acquired absence of left leg below knee: Secondary | ICD-10-CM

## 2014-04-27 DIAGNOSIS — E1140 Type 2 diabetes mellitus with diabetic neuropathy, unspecified: Secondary | ICD-10-CM

## 2014-04-27 DIAGNOSIS — N184 Chronic kidney disease, stage 4 (severe): Secondary | ICD-10-CM

## 2014-04-27 DIAGNOSIS — Z89511 Acquired absence of right leg below knee: Secondary | ICD-10-CM

## 2014-04-27 LAB — GLUCOSE, CAPILLARY
Glucose-Capillary: 199 mg/dL — ABNORMAL HIGH (ref 70–99)
Glucose-Capillary: 179 mg/dL — ABNORMAL HIGH (ref 70–99)
Glucose-Capillary: 201 mg/dL — ABNORMAL HIGH (ref 70–99)
Glucose-Capillary: 199 mg/dL — ABNORMAL HIGH (ref 70–99)

## 2014-04-27 MED ORDER — INSULIN DETEMIR 100 UNIT/ML ~~LOC~~ SOLN
15.0000 [IU] | Freq: Every day | SUBCUTANEOUS | Status: DC
  Administered 2014-04-28 – 2014-05-03 (×4): 15 [IU] via SUBCUTANEOUS
  Filled 2014-04-27 (×7): qty 0.15

## 2014-04-27 NOTE — Progress Notes (Signed)
[facility]     PROGRESS NOTE    Subjective/Complaints: Had a reasonable night. Denies pain. Pt denies nausea, vomiting, abdominal pain, diarrhea, chest pain, shortness of breath, palpitations, dizziness   Objective: Vital Signs: Blood pressure 122/53, pulse 73, temperature 98.7 F (37.1 C), temperature source Oral, resp. rate 16, weight 83.2 kg (183 lb 6.8 oz), SpO2 98 %. No results found.  Recent Labs  04/25/14 0615 04/26/14 1734  WBC 7.9 9.2  HGB 9.3* 9.0*  HCT 29.8* 28.7*  PLT 210 233    Recent Labs  04/25/14 0615 04/26/14 1734  NA 140 136  K 4.1 4.0  CL 99 97  GLUCOSE 178* 177*  BUN 26* 52*  CREATININE 5.18* 7.96*  CALCIUM 8.9 8.9   CBG (last 3)   Recent Labs  04/26/14 1621 04/26/14 2135 04/27/14 0704  GLUCAP 96 224* 199*    Wt Readings from Last 3 Encounters:  04/27/14 83.2 kg (183 lb 6.8 oz)  04/24/14 83.9 kg (184 lb 15.5 oz)  04/08/14 91.717 kg (202 lb 3.2 oz)    Physical Exam:  General: No acute distress,  Mood and affect are appropriate Heart: Regular rate and rhythm no rubs murmurs Lungs: Clear to auscultation, breathing unlabored, no rales or wheezes Abdomen: Positive bowel sounds, soft nontender to palpation, nondistended Extremities: No clubbing, cyanosis, or edema Skin: No evidence of breakdown, no evidence of rash Neurologic: Cranial nerves II through XII intact, motor strength isSensory exam normal sensation to light touch and proprioception in bilateral upperextremities , Lower extremities not tested secondary to amputations,UE 4- deltoid, 4/5 biceps, tricep, HI LE: 3/5 HF 4/5 ke. Some pain along left knee persists. Cerebellar exam normal finger to nose to finger as well as heel to shin in bilateral upper and lower extremities Musculoskeletal: Full range of motion in all 4 extremities. No joint swelling, Bilateral below-knee stumps well-healed Without pain to palpation, No edema Psych: appears to be  in better spirits today.  Assessment/Plan: 1. Functional deficits secondary to debility due to renal failure and multiple medical which require 3+ hours per day of interdisciplinary therapy in a comprehensive inpatient rehab setting. Physiatrist is providing close team supervision and 24 hour management of active medical problems listed below. Physiatrist and rehab team continue to assess barriers to discharge/monitor patient progress toward functional and medical goals.  Mr. Lonigro had just resumed outpt PT for prosthetic gait training prior to this hospitalization. He had been able to transfer somewhat with the prosthetics, but he hadn't been walking at home. Focus should be on transfers using prosthetics (if possible) but probably not gait while here on rehab.    FIM: FIM - Bathing Bathing Steps Patient Completed: Front perineal area, Buttocks, Left Arm, Right Arm, Abdomen, Chest Bathing: 4: Steadying assist  FIM - Upper Body Dressing/Undressing Upper body dressing/undressing steps patient completed: Thread/unthread right sleeve of pullover shirt/dresss, Thread/unthread left sleeve of pullover shirt/dress, Put head through opening of pull over shirt/dress Upper body dressing/undressing: 4: Min-Patient completed 75 plus % of tasks FIM - Lower Body Dressing/Undressing Lower body dressing/undressing steps patient completed: Pull pants up/down, Thread/unthread right underwear leg, Thread/unthread left underwear leg, Pull underwear up/down, Thread/unthread right pants leg, Thread/unthread left pants leg Lower body dressing/undressing: 4: Steadying Assist  FIM - Toileting Toileting steps completed by patient: Adjust clothing prior to toileting Toileting Assistive Devices: Grab bar or rail for support Toileting: 1: Two helpers  FIM - Radio producer Devices: Product manager  Transfers: 1-Two helpers  FIM - Control and instrumentation engineer  Devices: Arm rests Bed/Chair Transfer: 2: Bed > Chair or W/C: Max A (lift and lower assist), 3: Chair or W/C > Bed: Mod A (lift or lower assist)  FIM - Locomotion: Wheelchair Locomotion: Wheelchair: 1: Total Assistance/staff pushes wheelchair (Pt<25%) (Pt declined due to UE pain) FIM - Locomotion: Ambulation Locomotion: Ambulation Assistive Devices: Parallel bars Ambulation/Gait Assistance: 3: Mod assist Locomotion: Ambulation: 1: Travels less than 50 ft with moderate assistance (Pt: 50 - 74%)  Comprehension Comprehension Mode: Auditory Comprehension: 7-Follows complex conversation/direction: With no assist  Expression Expression Mode: Verbal Expression: 7-Expresses complex ideas: With no assist            Medical Problem List and Plan: 1. Functional deficits secondary to Debility due to renal failure new HD in a patient with recent BKA's 2. DVT Prophylaxis/Anticoagulation: Pharmaceutical: Lovenox 3. Neuropathy/Pain Management: Continue lyrica bid 4. Mood: Team to provide ego support. LCSW to follow for evaluation and support.  5. Neuropsych: This patient is capable of making decisions on his own behalf. 6. Skin/Wound Care: Routine pressure relief measures. Monitor graft site for healing.  7. Fluids/Electrolytes/Nutrition: 1200 cc/FR daily to avoid overload.  Dietary ed.  8. DM type 2: Continue Levemir daily (took at night at home---change schedule)  SSI for elevated BS. Will monitor BS with achs checks.   -follow for further  Pattern after adjustments today 9. HTN: monitor BP every 8 hours. Continue Norvasc daily, cardura daily and apresoline tid.  10. Anemia of chronic disease: On aranesp daily 11. ESRD: To continue HD on TTS. Schedule dialysis sessions at end of day to help with tolerance and manage post procedure fatigue LOS (Days) 2 A FACE TO FACE EVALUATION WAS PERFORMED  SWARTZ,ZACHARY T 04/27/2014 9:12 AM

## 2014-04-27 NOTE — Progress Notes (Signed)
Assessment: 1.New ESRD. (OP slot at [facility] on TTS at 11) 2. Hyperkalemia, resolved 3. DM 4. Sec HPTH PTH 455 on hectorol 5. Anemia on aranesp Plan:  1 HD today 2 Rehab therapies  Subjective: Interval History: Tolerating reha, in good spirits  Objective: Vital signs in last 24 hours: Temp:  [98.2 F (36.8 C)-98.7 F (37.1 C)] 98.7 F (37.1 C) (04/16 0531) Pulse Rate:  [71-78] 73 (04/16 0531) Resp:  [16-18] 16 (04/16 0531) BP: (93-156)/(50-97) 122/53 mmHg (04/16 0531) SpO2:  [98 %-99 %] 98 % (04/16 0531) Weight:  [83.1 kg (183 lb 3.2 oz)-85 kg (187 lb 6.3 oz)] 83.2 kg (183 lb 6.8 oz) (04/16 0531) Weight change:   Intake/Output from previous day: 04/15 0701 - 04/16 0700 In: 720 [P.O.:720] Out: 2575 [Urine:575] Intake/Output this shift: Total I/O In: 600 [P.O.:600] Out: -   Lungs clear Cor RRR Bilat amputee, BKAs  Lab Results:  Recent Labs  04/25/14 0615 04/26/14 1734  WBC 7.9 9.2  HGB 9.3* 9.0*  HCT 29.8* 28.7*  PLT 210 233   BMET:  Recent Labs  04/25/14 0615 04/26/14 1734  NA 140 136  K 4.1 4.0  CL 99 97  CO2 28 25  GLUCOSE 178* 177*  BUN 26* 52*  CREATININE 5.18* 7.96*  CALCIUM 8.9 8.9   No results for input(s): PTH in the last 72 hours. Iron Studies: No results for input(s): IRON, TIBC, TRANSFERRIN, FERRITIN in the last 72 hours. Studies/Results: No results found.     LOS: 2 days   Graylyn Bunney C 04/27/2014,3:34 PM

## 2014-04-27 NOTE — Progress Notes (Signed)
Physical Therapy Session Note  Patient Details  Name: Aaron Long MRN: [id number] Date of Birth: [birth year]  Today's Date: 04/27/2014 PT Individual Time: 0900-1000 PT Individual Time Calculation (min): 60 min   Short Term Goals: Week 1:  PT Short Term Goal 1 (Week 1): STG=LTG due to LOS, S overall for bed mobility and transfers, Gait short distance with RW Min A  Skilled Therapeutic Interventions/Progress Updates:    Therapeutic Activity: Pt completes 2/3 steps for donning shorts in supine (PT assists pt in pulling pants over bottom). Pt req max A for supine to sit transfer in bed and doffs t-shirt and dons new one with set-up assist. Pt scoots from modified long sit to short it edge of bed with SBA for safety. PT instructs pt to don B prostheses eob to assist with tx, but pt refuses and explains that he puts his prosthetic limbs on when up in w/c. PT places slideboard (pt prefers to do lateral transfer instead of AP transfer) and PT instructs pt in leaning forward in w/c for improved bottom clearance over hump of wheel, but pt is unable to do so due to lack of ground reaction force since no prostheses were in place, thus req mod A from PT to complete scoot transfer. PT educates pt that it will be best if he dons/doffs B prostheses while EOB so that he has support from the prostheses, which will enable him to lean forward during the scoot transfer and achieve improved bottom clearance. Pt dons B prostheses up in w/c with set up assist for supplies.  Pt req min A sit to stand in // bars via pulling up on bars.  Pt req mod-max A sit to stand with wide RW from w/c.   W/C Management: PT instructs pt in w/c propulsion with B LEs (prostheses) and B UEs x 100' req SBA and increased time - pt used R UE as able.   Gait Training: PT instructs pt in ambulation in // bars x 10' req min A from PT and 2nd assist for w/c follow for safety - slow pace, but this walk includes turning around halfway  through // bars and turning around again to sit safely in w/c.  PT instructs pt in ambulation with RW x 15' req min-mod A from PT - guarding at belly due to trunk flexed forward and other hand on gait belt for safety - 2nd assist for w/c follow.   Pt's R arm pain is causing him to avoid using R arm during functional activity, but pt refuses to ask RN for pain meds. Pt's functional mobility is improved from yesterday, but he contines to have low activity tolerance. Continue per PT POC.   Therapy Documentation Precautions:  Precautions Precautions: Fall Precaution Comments: bil BKA, new AV fitula RUE Required Braces or Orthoses: Other Brace/Splint (bil) Other Brace/Splint: bil proestheses Restrictions Weight Bearing Restrictions: No Other Position/Activity Restrictions: New AV fistula in R UE Pain: Pain Assessment Pain Assessment: 0-10 Pain Score: 9  Pain Type: Acute pain Pain Location: Arm Pain Orientation: Right Pain Descriptors / Indicators: Sharp Pain Onset: On-going Pain Intervention(s): Emotional support Multiple Pain Sites: No  See FIM for current functional status  Therapy/Group: Individual Therapy  Concepcion Gillott M 04/27/2014, 9:06 AM

## 2014-04-28 ENCOUNTER — Inpatient Hospital Stay (HOSPITAL_COMMUNITY): Payer: Medicare Other | Admitting: Occupational Therapy

## 2014-04-28 ENCOUNTER — Inpatient Hospital Stay (HOSPITAL_COMMUNITY): Payer: Medicare Other

## 2014-04-28 LAB — GLUCOSE, CAPILLARY
GLUCOSE-CAPILLARY: 201 mg/dL — AB (ref 70–99)
Glucose-Capillary: 157 mg/dL — ABNORMAL HIGH (ref 70–99)
Glucose-Capillary: 214 mg/dL — ABNORMAL HIGH (ref 70–99)
Glucose-Capillary: 162 mg/dL — ABNORMAL HIGH (ref 70–99)

## 2014-04-28 MED ORDER — INSULIN DETEMIR 100 UNIT/ML ~~LOC~~ SOLN
10.0000 [IU] | Freq: Every day | SUBCUTANEOUS | Status: DC
  Administered 2014-04-28 – 2014-04-30 (×3): 10 [IU] via SUBCUTANEOUS
  Filled 2014-04-28 (×4): qty 0.1

## 2014-04-28 MED ORDER — ASPIRIN 81 MG PO CHEW
CHEWABLE_TABLET | ORAL | Status: AC
  Administered 2014-04-28: 81 mg
  Filled 2014-04-28: qty 1

## 2014-04-28 NOTE — IPOC Note (Addendum)
Overall Plan of Care [facility] Patient Details Name: Aaron Long MRN: [id number] DOB: 06-04-1940  Admitting Diagnosis: old b bka with prosthesis new esrd   Hospital Problems: Active Problems:   CKD (chronic kidney disease) stage 4, GFR 15-29 ml/min   DM type 2, uncontrolled, with neuropathy   Status post bilateral below knee amputation   Physical deconditioning     Functional Problem List: Nursing Bowel, Endurance, Medication Management, Safety  PT Balance, Endurance, Pain, Safety, Sensory, Skin Integrity  OT Balance, Endurance, Motor, Pain, Perception, Safety, Sensory, Vision  SLP    TR Activity tolerance, functional mobility, balance, safety, pain, anxiety/stress        Basic ADL's: OT Bathing, Dressing, Toileting     Advanced  ADL's: OT       Transfers: PT Bed Mobility, Bed to Chair, Car, Manufacturing systems engineer, Metallurgist: PT Ambulation, Emergency planning/management officer     Additional Impairments: OT Fuctional Use of Upper Extremity  SLP        TR      Anticipated Outcomes Item Anticipated Outcome  Self Feeding Mod I  Swallowing      Basic self-care  Mod I  Toileting  Mod A   Bathroom Transfers Min A  Bowel/Bladder  manage bowel and bladder min assist  Transfers  Supervision  Locomotion  Min A with RW  Communication     Cognition     Pain  3 or less  Safety/Judgment  min assist   Therapy Plan: PT Intensity: Minimum of 1-2 x/day ,45 to 90 minutes PT Frequency: 5 out of 7 days PT Duration Estimated Length of Stay: 10-14 days OT Intensity: Minimum of 1-2 x/day, 45 to 90 minutes OT Frequency: 5 out of 7 days OT Duration/Estimated Length of Stay: 10-12 days  TR Duration/ELOS:  10 Days TR Frequency:  Min 1 time per week >20 minutes           Team Interventions: Nursing Interventions Patient/Family Education, Bowel Management, Disease Management/Prevention, Medication Management  PT interventions Ambulation/gait training,  Training and development officer, Cognitive remediation/compensation, Community reintegration, Discharge planning, Disease management/prevention, DME/adaptive equipment instruction, Functional mobility training, Neuromuscular re-education, Pain management, Patient/family education, Psychosocial support, Skin care/wound management, Splinting/orthotics, Therapeutic Activities, Therapeutic Exercise, UE/LE Strength taining/ROM, Wheelchair propulsion/positioning  OT Interventions Training and development officer, Discharge planning, Community reintegration, Engineer, drilling, Neuromuscular re-education, Functional mobility training, Patient/family education, Self Care/advanced ADL retraining, Therapeutic Activities, Therapeutic Exercise, UE/LE Strength taining/ROM, UE/LE Coordination activities  SLP Interventions    TR Interventions Recreation/leisure participation, Balance/Vestibular training, functional mobility, therapeutic activities, UE/LE strength/coordination, w/c mobility, community reintegration, pt/family education, adaptive equipment instruction/use, discharge planning, psychosocial support   SW/CM Interventions Discharge Planning, Barrister's clerk, Patient/Family Education    Team Discharge Planning: Destination: PT-Home ,OT- Home , SLP-  Projected Follow-up: PT-Outpatient PT, 24 hour supervision/assistance, OT-  Home health OT, SLP-  Projected Equipment Needs: PT-To be determined, OT-  , SLP-  Equipment Details: PT-Pt may have all equipment needs, OT-Pt reports having 3-1 BSC and tub transfer bench Patient/family involved in discharge planning: PT- Patient,  OT- , SLP-   MD ELOS: 10-12 days Medical Rehab Prognosis:  Excellent Assessment: The patient has been admitted for CIR therapies with the diagnosis of debility after multiple medical. The team will be addressing functional mobility, strength, stamina, balance, safety, adaptive techniques and equipment, self-care, bowel and  bladder mgt, patient and caregiver education,pain mgt, activity tolerance. Goals have been set at min to mod assist with self-care and  min assist to supervision with mobility.    Meredith Staggers, MD, FAAPMR      See Team Conference Notes for weekly updates to the plan of care

## 2014-04-28 NOTE — Progress Notes (Signed)
Occupational Therapy Session Note  Patient Details  Name: Aaron Long MRN: [id number] Date of Birth: 11-12-1940  Today's Date: 04/28/2014 OT Individual Time:  -   [id number]  (76  Min)  1st session                                            1330-1430  (60 min) 2nd session   Short Term Goals: Week 1:  OT Short Term Goal 1 (Week 1): STG=LTG      Skilled Therapeutic Interventions/Progress Updates:      1st session:  OT skilled intervention to address the following deficits:   Balance/ therapeutic Activities;Therapeutic Exercise;UE/LE Strength taining/ROM; wc mobility, sit to stand, standing balance.   Pt. Lying bed upon OT arrival..  Pt went from lying to sitting with mod assist.  Sat EOB and donned prothesis with set up assist.  Transferred to wc and completed bathing and dressing at sink.  Performed lateral lean for peri care.   Pt. Propelled self to room.   Left pt in wc with  call bell,phone within reach.     Therapy Documentation Precautions:  Precautions Precautions: Fall Precaution Comments: bil BKA, new AV fitula RUE Required Braces or Orthoses: Other Brace/Splint (bil) Other Brace/Splint: bil proestheses Restrictions Weight Bearing Restrictions: No Other Position/Activity Restrictions: New AV fistula in R UE    Pain:  1st session and 2nd session  4/10 back      Exercises: forward pass 40 sec with one rest break   Other Treatments:    2nd session:  Time:  See above:  Rx focus on the following:  wc mobility, UE AROM and strengthening.  Pt propelled wc with BUE for 100 feet and then used legs for 200 feet.  Rolled to atrium and outside on sidewalk.  Performed AROm with BUE, wc push ups.  Returned to room and left with all needs in reach.   See FIM for current functional status  Therapy/Group: Individual Therapy  Lisa Roca 04/28/2014, 7:37 AM

## 2014-04-28 NOTE — Progress Notes (Signed)
Physical Therapy Session Note  Patient Details  Name: Aaron Long MRN: [id number] Date of Birth: [birth year]  Today's Date: 04/28/2014 PT Individual Time: 0930-1030 PT Individual Time Calculation (min): 60 min   Short Term Goals: Week 1:  PT Short Term Goal 1 (Week 1): STG=LTG due to LOS, S overall for bed mobility and transfers, Gait short distance with RW Min A  Skilled Therapeutic Interventions/Progress Updates:    Pt received seated in w/c, agreeable to participate in therapy. Session focused on gait, transfers, w/c propulsion, sit<>stands. Pt w/ slide board transfer w/c<>mat table w/ MinA from w/c and ModA to w/c w/ max cueing for head-hips relationship and additional assist to prevent skin shearing. Instructed pt in multiple sit<>stands from w/c and from chair with full arm rests. Noted improvement with sit>stand when pt had full length arm rests on armchair to allow additional use of UE in more efficient position vs pushing up from desk length arm rests on w/c. Pt was Mod-MaxA to sit>stand, CGA to stand>sit, and MinA to ambulate 25' at a time w/ RW and bilateral prostheses. Pt tolerated session well, but fatigued at end of each bout of ambulation. Session ended in pt's room, where pt was left seated in w/c w/ all needs within reach.    Therapy Documentation Precautions:  Precautions Precautions: Fall Precaution Comments: bil BKA, new AV fitula RUE Required Braces or Orthoses: Other Brace/Splint (bil) Other Brace/Splint: bil proestheses Restrictions Weight Bearing Restrictions: No Other Position/Activity Restrictions: New AV fistula in R UE Pain:  No/denies pain  See FIM for current functional status  Therapy/Group: Individual Therapy  Rada Hay  Rada Hay, PT, DPT 04/28/2014, 7:52 AM

## 2014-04-28 NOTE — Progress Notes (Signed)
[place] PHYSICAL MEDICINE & REHABILITATION     PROGRESS NOTE    Subjective/Complaints: No problems yesterday. Has busy therapy day ahead. Mood good. Pt denies nausea, vomiting, abdominal pain, diarrhea, chest pain, shortness of breath, palpitations, dizziness   Objective: Vital Signs: Blood pressure 115/56, pulse 73, temperature 98.1 F (36.7 C), temperature source Oral, resp. rate 17, weight 84 kg (185 lb 3 oz), SpO2 96 %. No results found.  Recent Labs  04/26/14 1734  WBC 9.2  HGB 9.0*  HCT 28.7*  PLT 233    Recent Labs  04/26/14 1734  NA 136  K 4.0  CL 97  GLUCOSE 177*  BUN 52*  CREATININE 7.96*  CALCIUM 8.9   CBG (last 3)   Recent Labs  04/27/14 1704 04/27/14 2045 04/28/14 0709  GLUCAP 201* 199* 201*    Wt Readings from Last 3 Encounters:  04/28/14 84 kg (185 lb 3 oz)  04/24/14 83.9 kg (184 lb 15.5 oz)  04/08/14 91.717 kg (202 lb 3.2 oz)    Physical Exam:  General: No acute distress,  Mood and affect are appropriate Heart: Regular rate and rhythm no rubs murmurs Lungs: Clear to auscultation, breathing unlabored, no rales or wheezes Abdomen: Positive bowel sounds, soft nontender to palpation, nondistended Extremities: No clubbing, cyanosis, or edema Skin: No evidence of breakdown, no evidence of rash Neurologic: Cranial nerves II through XII intact, motor strength isSensory exam normal sensation to light touch and proprioception in bilateral upperextremities , Lower extremities not tested secondary to amputations,UE 4- deltoid, 4/5 biceps, tricep, HI LE: 3/5 HF 4/5 ke. Some pain along left knee   Cerebellar exam normal finger to nose to finger as well as heel to shin in bilateral upper and lower extremities Musculoskeletal: Full range of motion in all 4 extremities. No joint swelling, Bilateral below-knee stumps well-healed Without pain to palpation, No edema Psych: in good spirits. alert.  Assessment/Plan: 1. Functional deficits secondary  to debility due to renal failure and multiple medical which require 3+ hours per day of interdisciplinary therapy in a comprehensive inpatient rehab setting. Physiatrist is providing close team supervision and 24 hour management of active medical problems listed below. Physiatrist and rehab team continue to assess barriers to discharge/monitor patient progress toward functional and medical goals.  Mr. Grzywacz had just resumed outpt PT for prosthetic gait training prior to this hospitalization. He had been able to transfer somewhat with the prosthetics, but he hadn't been walking at home. Focus should be on transfers using prosthetics (if possible) but probably not gait while here on rehab.    FIM: FIM - Bathing Bathing Steps Patient Completed: Front perineal area, Buttocks, Left Arm, Right Arm, Abdomen, Chest Bathing: 4: Steadying assist  FIM - Upper Body Dressing/Undressing Upper body dressing/undressing steps patient completed: Thread/unthread right sleeve of pullover shirt/dresss, Thread/unthread left sleeve of pullover shirt/dress, Put head through opening of pull over shirt/dress, Pull shirt over trunk Upper body dressing/undressing: 5: Set-up assist to: Obtain clothing/put away FIM - Lower Body Dressing/Undressing Lower body dressing/undressing steps patient completed: Thread/unthread right pants leg, Thread/unthread left pants leg Lower body dressing/undressing: 3: Mod-Patient completed 50-74% of tasks  FIM - Toileting Toileting steps completed by patient: Adjust clothing prior to toileting Toileting Assistive Devices: Grab bar or rail for support Toileting: 1: Two helpers  FIM - Radio producer Devices: Product manager Transfers: 1-Two helpers  FIM - Control and instrumentation engineer Devices: Sliding board, Arm rests, Prosthesis, Adult nurse Transfer: 3:  Bed > Chair or W/C: Mod A (lift or lower assist), 2: Chair or W/C > Bed: Max A  (lift and lower assist)  FIM - Locomotion: Wheelchair Distance: 100 Locomotion: Wheelchair: 2: Travels 50 - 149 ft with supervision, cueing or coaxing FIM - Locomotion: Ambulation Locomotion: Ambulation Assistive Devices: Parallel bars, Walker - Rolling, Prosthesis Ambulation/Gait Assistance: 1: +2 Total assist Locomotion: Ambulation: 1: Two helpers  Comprehension Comprehension Mode: Auditory Comprehension: 7-Follows complex conversation/direction: With no assist  Expression Expression Mode: Verbal Expression: 7-Expresses complex ideas: With no assist  Social Interaction Social Interaction: 7-Interacts appropriately with others - No medications needed.  Problem Solving Problem Solving: 5-Solves basic problems: With no assist  Memory Memory: 6-More than reasonable amt of time   Medical Problem List and Plan: 1. Functional deficits secondary to Debility due to renal failure new HD in a patient with recent BKA's 2. DVT Prophylaxis/Anticoagulation: Pharmaceutical: Lovenox 3. Neuropathy/Pain Management: Continue lyrica bid 4. Mood: Team to provide ego support. LCSW to follow for evaluation and support.  5. Neuropsych: This patient is capable of making decisions on his own behalf. 6. Skin/Wound Care: Routine pressure relief measures. Monitor graft site for healing.  7. Fluids/Electrolytes/Nutrition: 1200 cc/FR daily to avoid overload.  Dietary ed.  8. DM type 2: Continue Levemir daily (took at night at home---change schedule)  SSI for elevated BS. Will monitor BS with achs checks.   -will add am dose of levemir also 9. HTN: monitor BP every 8 hours. Continue Norvasc daily, cardura daily and apresoline tid.  10. Anemia of chronic disease: On aranesp daily 11. ESRD: To continue HD on TTS. Schedule dialysis sessions at end of day to help with tolerance and manage post procedure fatigue LOS (Days) 3 A FACE TO FACE EVALUATION WAS PERFORMED  Emil Klassen T 04/28/2014 9:12  AM

## 2014-04-29 ENCOUNTER — Encounter: Payer: Medicare Other | Admitting: Physical Therapy

## 2014-04-29 ENCOUNTER — Inpatient Hospital Stay (HOSPITAL_COMMUNITY): Payer: Medicare Other | Admitting: Occupational Therapy

## 2014-04-29 ENCOUNTER — Inpatient Hospital Stay (HOSPITAL_COMMUNITY): Payer: Medicare Other

## 2014-04-29 LAB — CBC
HCT: 29.1 % — ABNORMAL LOW (ref 39.0–52.0)
HEMOGLOBIN: 9.3 g/dL — AB (ref 13.0–17.0)
MCH: 28.7 pg (ref 26.0–34.0)
MCHC: 32 g/dL (ref 30.0–36.0)
MCV: 89.8 fL (ref 78.0–100.0)
PLATELETS: 274 10*3/uL (ref 150–400)
RBC: 3.24 MIL/uL — AB (ref 4.22–5.81)
RDW: 14.2 % (ref 11.5–15.5)
WBC: 9.5 10*3/uL (ref 4.0–10.5)

## 2014-04-29 LAB — RENAL FUNCTION PANEL
ALBUMIN: 3.3 g/dL — AB (ref 3.5–5.2)
ANION GAP: 15 (ref 5–15)
BUN: 70 mg/dL — ABNORMAL HIGH (ref 6–23)
CHLORIDE: 93 mmol/L — AB (ref 96–112)
CO2: 23 mmol/L (ref 19–32)
CREATININE: 9.61 mg/dL — AB (ref 0.50–1.35)
Calcium: 8.8 mg/dL (ref 8.4–10.5)
GFR calc Af Amer: 5 mL/min — ABNORMAL LOW (ref >90)
GFR, EST NON AFRICAN AMERICAN: 5 mL/min — AB (ref >90)
Glucose, Bld: 81 mg/dL (ref 70–99)
PHOSPHORUS: 7.9 mg/dL — AB (ref 2.3–4.6)
Potassium: 4.3 mmol/L (ref 3.5–5.1)
Sodium: 131 mmol/L — ABNORMAL LOW (ref 135–145)

## 2014-04-29 LAB — GLUCOSE, CAPILLARY
Glucose-Capillary: 158 mg/dL — ABNORMAL HIGH (ref 70–99)
Glucose-Capillary: 334 mg/dL — ABNORMAL HIGH (ref 70–99)
Glucose-Capillary: 90 mg/dL (ref 70–99)

## 2014-04-29 MED ORDER — DOXERCALCIFEROL 4 MCG/2ML IV SOLN
INTRAVENOUS | Status: AC
  Filled 2014-04-29: qty 2

## 2014-04-29 NOTE — Progress Notes (Signed)
[place] PHYSICAL MEDICINE & REHABILITATION     PROGRESS NOTE    Subjective/Complaints: Denies any issues. Tolerated a busy day of therapy yesterday. . no nausea, vomiting, abdominal pain, diarrhea, chest pain, shortness of breath, palpitations, dizziness   Objective: Vital Signs: Blood pressure 145/58, pulse 72, temperature 98.2 F (36.8 C), temperature source Oral, resp. rate 17, weight 84 kg (185 lb 3 oz), SpO2 97 %. No results found.  Recent Labs  04/26/14 1734  WBC 9.2  HGB 9.0*  HCT 28.7*  PLT 233    Recent Labs  04/26/14 1734  NA 136  K 4.0  CL 97  GLUCOSE 177*  BUN 52*  CREATININE 7.96*  CALCIUM 8.9   CBG (last 3)   Recent Labs  04/28/14 1656 04/28/14 2118 04/29/14 0650  GLUCAP 214* 162* 158*    Wt Readings from Last 3 Encounters:  04/28/14 84 kg (185 lb 3 oz)  04/24/14 83.9 kg (184 lb 15.5 oz)  04/08/14 91.717 kg (202 lb 3.2 oz)    Physical Exam:  General: No acute distress,  Mood and affect are appropriate Heart: Regular rate and rhythm no rubs murmurs Lungs: Clear to auscultation, breathing unlabored, no rales or wheezes Abdomen: Positive bowel sounds, soft nontender to palpation, nondistended Extremities: No clubbing, cyanosis, or edema Skin: No evidence of breakdown, no evidence of rash Neurologic: Cranial nerves II through XII intact, motor strength isSensory exam normal sensation to light touch and proprioception in bilateral upperextremities , Lower extremities not tested secondary to amputations,UE 4- deltoid, 4/5 biceps, tricep, HI LE: 3/5 HF 4/5 ke. Some pain along left knee   Cerebellar exam normal finger to nose to finger as well as heel to shin in bilateral upper and lower extremities Musculoskeletal: Full range of motion in all 4 extremities. No joint swelling, Bilateral below-knee stumps well-healed Without pain to palpation, No edema Psych: in good spirits. alert.  Assessment/Plan: 1. Functional deficits secondary to  debility due to renal failure and multiple medical which require 3+ hours per day of interdisciplinary therapy in a comprehensive inpatient rehab setting. Physiatrist is providing close team supervision and 24 hour management of active medical problems listed below. Physiatrist and rehab team continue to assess barriers to discharge/monitor patient progress toward functional and medical goals.      FIM: FIM - Bathing Bathing Steps Patient Completed: Front perineal area, Buttocks, Left Arm, Right Arm, Abdomen, Chest, Right upper leg, Left upper leg Bathing: 4: Steadying assist  FIM - Upper Body Dressing/Undressing Upper body dressing/undressing steps patient completed: Thread/unthread right sleeve of pullover shirt/dresss, Thread/unthread left sleeve of pullover shirt/dress, Put head through opening of pull over shirt/dress, Pull shirt over trunk Upper body dressing/undressing: 5: Set-up assist to: Obtain clothing/put away FIM - Lower Body Dressing/Undressing Lower body dressing/undressing steps patient completed: Thread/unthread right pants leg, Thread/unthread left pants leg, Thread/unthread right underwear leg, Thread/unthread left underwear leg, Pull underwear up/down, Pull pants up/down, Don/Doff right shoe, Don/Doff left shoe Lower body dressing/undressing: 4: Min-Patient completed 75 plus % of tasks  FIM - Toileting Toileting steps completed by patient: Adjust clothing prior to toileting Toileting Assistive Devices: Grab bar or rail for support Toileting: 1: Two helpers  FIM - Radio producer Devices: Product manager Transfers: 0-Activity did not occur  FIM - Control and instrumentation engineer Devices: Sliding board, Arm rests, Prosthesis, Adult nurse Transfer: 3: Bed > Chair or W/C: Mod A (lift or lower assist), 4: Chair or W/C > Bed: Min  A (steadying Pt. > 75%)  FIM - Locomotion: Wheelchair Distance: 100 Locomotion: Wheelchair:  1: Travels less than 50 ft with supervision, cueing or coaxing FIM - Locomotion: Ambulation Locomotion: Ambulation Assistive Devices: Administrator, Prosthesis Ambulation/Gait Assistance: 4: Min assist, 2: Max assist (MaxA to come to standing, MinA to walk) Locomotion: Ambulation: 1: Travels less than 50 ft with maximal assistance (Pt: 25 - 49%)  Comprehension Comprehension Mode: Auditory Comprehension: 7-Follows complex conversation/direction: With no assist  Expression Expression Mode: Verbal Expression: 7-Expresses complex ideas: With no assist  Social Interaction Social Interaction: 7-Interacts appropriately with others - No medications needed.  Problem Solving Problem Solving: 5-Solves basic problems: With no assist  Memory Memory: 6-More than reasonable amt of time   Medical Problem List and Plan: 1. Functional deficits secondary to Debility due to renal failure new HD in a patient with recent BKA's 2. DVT Prophylaxis/Anticoagulation: Pharmaceutical: Lovenox 3. Neuropathy/Pain Management: Continue lyrica bid 4. Mood: Team to provide ego support. LCSW to follow for evaluation and support.  5. Neuropsych: This patient is capable of making decisions on his own behalf. 6. Skin/Wound Care: Routine pressure relief measures. Monitor graft site for healing.  7. Fluids/Electrolytes/Nutrition: 1200 cc/FR daily to avoid overload.  Dietary ed.  8. DM type 2: Continue Levemir daily (took at night at home---change schedule)  SSI for elevated BS. Will monitor BS with achs checks.   -added am dose of levemir also yesterday---follow for pattern today 9. HTN: monitor BP every 8 hours. Continue Norvasc daily, cardura daily and apresoline tid.  10. Anemia of chronic disease: On aranesp daily 11. ESRD: To continue HD on TTS.  dialysis sessions occur at end of day to allow therapy and manage post procedure fatigue LOS (Days) 4 A FACE TO FACE EVALUATION WAS  PERFORMED  Tejuan Gholson T 04/29/2014 7:26 AM

## 2014-04-29 NOTE — Progress Notes (Signed)
Occupational Therapy Session Note  Patient Details  Name: Aaron Long MRN: [id number] Date of Birth: 14-May-1940  Today's Date: 04/29/2014 OT Individual Time: 1300-1400 OT Individual Time Calculation (min): 60 min    Short Term Goals: Week 1:  OT Short Term Goal 1 (Week 1): STG=LTG  Skilled Therapeutic Interventions/Progress Updates:    Pt seen for OT session focusing on functional mobility and functional activity tolerance. Pt in supine upon arrival, agreeable to tx. Pt transferred supine>EOB with mod A, and EOB> w/c with min A via scooting and VCs for safety. Pt taken to therapy gym with total A for time. He requested to work on bed mobility during session. Pt transferred w/c> therapy mat via scooting. Education provided regarding be cautious of skin break down on buttock when completing scooting transfers, pt voiced understands. On therapy mat, pt completed bed mobility tasks of supine <> Edge of mat. Education and demonstration provided for log rolling technique. He required supervision EOM> supine and min-mod A for 4 trials of supine> edge of mat on R side. Pt with decreased functional endurance, requiring rest breaks throughout.    Pt then sat edge of mat, placing clothes pins of various levels on clothes pins tree. Pt completed with R UE, and then a 1lb wrist weight placed on L UE to complete remainder of task with L UE. The pt was then asked to complete the same task again, however, second trial required pt to lean back against foam wedge and complete modified crunch to come into upright sitting. Pt able to complete x6 trials before fatiguing and requiring mod A to come into upright sitting. Pt then sat edge of mat with support of therapy ball from behind for supported sitting to complete task. At end of session, pt transferred EOM> w/c via stand pivot transfer with RW. He required mod-max A to stand from EOM, and min A and VCs to complete transfer. Pt taken back to room with total A and  left sitting in w/c with all needs in reach.  Education provided to pt regarding log rolling technique, energy conservation, deep breathing, benefits of mobility, and d/c planning.   Therapy Documentation Precautions:  Precautions Precautions: Fall Precaution Comments: bil BKA, new AV fitula RUE Required Braces or Orthoses: Other Brace/Splint (bil) Other Brace/Splint: bil proestheses Restrictions Weight Bearing Restrictions: No Other Position/Activity Restrictions: New AV fistula in R UE Pain: Pain Assessment Pain Assessment: No/denies pain Pain Score: 0-No pain  See FIM for current functional status  Therapy/Group: Individual Therapy  Lewis, Flornce Record C 04/29/2014, 3:34 PM

## 2014-04-29 NOTE — Progress Notes (Signed)
Physical Therapy Session Note  Patient Details  Name: Aaron Long MRN: [id number] Date of Birth: 04-Oct-1940  Today's Date: 04/29/2014 PT Individual Time: 0900-1000 PT Individual Time Calculation (min): 60 min   Short Term Goals: Week 1:  PT Short Term Goal 1 (Week 1): STG=LTG due to LOS, S overall for bed mobility and transfers, Gait short distance with RW Min A  Skilled Therapeutic Interventions/Progress Updates:    Session focused on functional strengthening and endurance, transfers (including min A scoot pivot from w/c to mat, w/c propulsion and mobility to address functional mobility, sit to stands from elevated height 23" (x 5 reps) and 22" (x 3 reps), discussion of home set-up (pt plans to remove mattress at home due to increased bed height), and short distance gait training with RW.   Required min A to light mod A for sit to stands and extra time from slightly elevated mat (heights above) with cues for hand placement and technique. Seated LAQ during rest breaks x 10 reps each BLE. Pt able to gait 30' with RW and bilateral prosthesis with min A - cues for posture and to maintain body inside of RW. Therapist also placed solid seat insert into pt's personal w/c for improved support. Discussed addressing getting a smaller w/c (this chair is 3 months old per report) and possibly modification to arm rest style to increase pt's ability to perform functional sit to stands (mechanical advantage of longer armrests to be able to push up from).   Therapy Documentation Precautions:  Precautions Precautions: Fall Precaution Comments: bil BKA, new AV fitula RUE Required Braces or Orthoses: Other Brace/Splint (bil) Other Brace/Splint: bil proestheses Restrictions Weight Bearing Restrictions: No Other Position/Activity Restrictions: New AV fistula in R UE  Pain: Pain Assessment Pain Assessment: No/denies pain  See FIM for current functional status  Therapy/Group: Individual Therapy     Canary Brim Ivory Broad, PT, DPT  04/29/2014, 10:19 AM

## 2014-04-29 NOTE — Progress Notes (Signed)
   [facility] Progress Note   Subjective: R hand is "dead and cold" since they put in the AVF  Filed Vitals:   04/28/14 2157 04/28/14 2158 04/29/14 0552 04/29/14 0726  BP: 131/68 131/68 145/58   Pulse:   72   Temp:   98.2 F (36.8 C)   TempSrc:   Oral   Resp:   17   Weight:    85.5 kg (188 lb 7.9 oz)  SpO2:   97%    Exam: Alert, no distress No jvd Chest clear bilat RRR no MRG Abd soft, NTND Bilat BKA, no edema R forearm AVF tender slightly R hand decreased grip and cool   HD: new start, getting HD MWF here, TTS as OP        Assessment: 1. New ESRD - OP slot at [facility] TTS at 11 am; new AVF and IJ cath 2. Debility - on rehab, bilat BKA 3. DM 4. MBD pth 455 on hectorol 5. Anemia cont aranesp 6. R AVF - steal symptoms R hand, have asked [facility] to evaluate  Plan - HD today, then TTS    Kelly Splinter MD  pager [phone]    cell [phone]  04/29/2014, 10:04 AM     Recent Labs Lab 04/24/14 1302 04/25/14 0615 04/26/14 1734  NA 135 140 136  K 3.9 4.1 4.0  CL 97 99 97  CO2 26 28 25   GLUCOSE 204* 178* 177*  BUN 51* 26* 52*  CREATININE 6.91* 5.18* 7.96*  CALCIUM 9.0 8.9 8.9  PHOS 5.1*  --  6.7*    Recent Labs Lab 04/24/14 1302 04/26/14 1734  ALBUMIN 3.0* 3.1*    Recent Labs Lab 04/24/14 1301 04/25/14 0615 04/26/14 1734  WBC 8.4 7.9 9.2  HGB 9.1* 9.3* 9.0*  HCT 28.4* 29.8* 28.7*  MCV 90.4 92.0 91.1  PLT 212 210 233   . amLODipine  10 mg Oral Daily  . aspirin EC  81 mg Oral q morning - 10a  . clopidogrel  75 mg Oral Daily  . darbepoetin (ARANESP) injection - DIALYSIS  60 mcg Intravenous Q Fri-HD  . doxazosin  1 mg Oral QHS  . doxercalciferol  2 mcg Intravenous Q M,W,F-HD  . enoxaparin (LOVENOX) injection  30 mg Subcutaneous Q24H  . feeding supplement (NEPRO CARB STEADY)  237 mL Oral Q1500  . hydrALAZINE  100 mg Oral 3 times per day  . insulin aspart  0-9 Units Subcutaneous TID WC  . insulin aspart  3 Units Subcutaneous TID WC  .  insulin detemir  10 Units Subcutaneous Daily  . insulin detemir  15 Units Subcutaneous QHS  . multivitamin  1 tablet Oral QHS  . pantoprazole  20 mg Oral Daily  . pravastatin  20 mg Oral Daily  . pregabalin  75 mg Oral Daily  . QUEtiapine  25 mg Oral Daily  . senna-docusate  2 tablet Oral BID     acetaminophen, aluminum hydroxide, bisacodyl, guaiFENesin-dextromethorphan, mineral oil, promethazine **OR** promethazine **OR** promethazine, traZODone

## 2014-04-29 NOTE — Progress Notes (Signed)
Social Work  Social Work Assessment and Plan  Patient Details  Name: Aaron Long MRN: [id number] Date of Birth: 1940-05-11  Today's Date: 04/26/2014  Problem List:  Patient Active Problem List   Diagnosis Date Noted  . Physical deconditioning 04/25/2014  . Syncope 04/17/2014  . Uncontrolled hypertension   . Chest pain 04/08/2014  . Acute on chronic renal failure 04/08/2014  . Hypertensive urgency 04/08/2014  . Status post bilateral below knee amputation 04/02/2014  . Other constipation 09/26/2013  . Cervical disc disorder with radiculopathy of cervical region 08/07/2013  . Drug-induced constipation 06/13/2013  . DM type 2, uncontrolled, with renal complications 19/41/7408  . Hypertensive renal disease 06/13/2013  . Encounter for therapeutic drug monitoring 06/13/2013  . DM type 2, uncontrolled, with neuropathy 01/23/2013  . Hyperlipidemia with target LDL less than 100 12/06/2012  . Need for prophylactic vaccination and inoculation against influenza 09/30/2012  . Diabetes mellitus with peripheral vascular disease 09/06/2012  . GERD (gastroesophageal reflux disease) 09/06/2012  . Aftercare following surgery of the circulatory system, [place] 08/03/2012  . Anemia in chronic kidney disease(285.21) 07/24/2012  . Atherosclerosis of native arteries of the extremities with ulceration(440.23) 06/01/2012  . Hyperkalemia 04/24/2012  . Diabetic neuropathy, type II diabetes mellitus 07/27/2011  . Atherosclerosis of native arteries of the extremities with intermittent claudication 07/08/2011  . S/P BKA (below knee amputation) 06/22/2011  . Atherosclerosis of native arteries of the extremities with gangrene 06/10/2011  . CKD (chronic kidney disease) stage 4, GFR 15-29 ml/min 05/02/2011  . HTN (hypertension) 04/28/2011  . CAD (coronary artery disease) 04/28/2011  . PAD (peripheral artery disease) 04/28/2011  . Prostate cancer 04/28/2011   Past Medical History:  Past Medical History   Diagnosis Date  . PAD (peripheral artery disease)     a. R CEA 2007. b. Hx L BKA in 2013. c. s/p LE angioplasty in 2014. d. Hx R BKA in 2014.  . Gangrene of toe     dry  . Neuromuscular disorder     diabetic neuropathy  . Hypertension   . Hyperlipidemia   . Peripheral neuropathy   . Constipation     takes Miralax daily  . Hemorrhoids   . Hx of colonic polyps   . Coronary artery disease     a. s/p NSTEMI/CABGx4 in 2007 - LIMA-LAD, SVG-optional diagonal, SVVG-OM, SVG-dRCA. b. Nuc 05/2011: nonischemic.  [name] BPH (benign prostatic hypertrophy)   . Abnormality of gait   . DVT (deep venous thrombosis)     a. Lower extremity DVT in 2013.  [name] Hypertrophy of prostate without urinary obstruction and other lower urinary tract symptoms (LUTS)   . Disorder of bone and cartilage, unspecified   . Lower limb amputation, below knee   . Anemia   . Secondary hyperparathyroidism     Secondary Hyperpara- Thyroidism, Renal  . Ischemic cardiomyopathy     a. EF 40% in 2007. b. 51% by nuc 05/2011.  [name] Complication of anesthesia     "he gets delirious"  . Myocardial infarction 2005  . Type II diabetes mellitus   . Diabetic peripheral neuropathy   . ESRD (end stage renal disease)     "not ready yet for dialysis" (04/17/2014)  . Kidney stones   . Prostate cancer 2010   Past Surgical History:  Past Surgical History  Procedure Laterality Date  . Prostatectomy  2009  . Knee cartilage surgery Left 1964  . Coronary artery bypass graft  2005    CABG X4  .  Carotid endarterectomy Right 2005  . Cataract extraction w/ intraocular lens  implant, bilateral Bilateral 2011  . Colonoscopy    . Cardiac catheterization  05/28/11  . Amputation  06/14/2011    Procedure: AMPUTATION DIGIT;  Surgeon: Elam Dutch, MD;  Location: [facility] OR;  Service: Vascular;  Laterality: Left;  Amputation Left fifth toe  . Amputation  06/16/2011    Procedure: AMPUTATION BELOW KNEE;  Surgeon: Elam Dutch, MD;  Location: [place];  Service:  Vascular;  Laterality: Left;  . I&d extremity  01/31/2012    Procedure: IRRIGATION AND DEBRIDEMENT EXTREMITY;  Surgeon: Elam Dutch, MD;  Location: [place];  Service: Vascular;  Laterality: Left;  I & D Left BKA   . Amputation Right 06/12/2012    Procedure: AMPUTATION DIGIT;  Surgeon: Elam Dutch, MD;  Location[facility] OR;  Service: Vascular;  Laterality: Right;  GREAT TOE  . Amputation Right 08/08/2012    Procedure: AMPUTATION BELOW KNEE;  Surgeon: Elam Dutch, MD;  Location: [facility] OR;  Service: Vascular;  Laterality: Right;  . Abdominal aortagram N/A 05/28/2011    Procedure: ABDOMINAL Maxcine Ham;  Surgeon: Elam Dutch, MD;  Location: [facility] CATH LAB;  Service: Cardiovascular;  Laterality: N/A;  . Abdominal aortagram N/A 06/02/2012    Procedure: ABDOMINAL Maxcine Ham;  Surgeon: Elam Dutch, MD;  Location: [facility] CATH LAB;  Service: Cardiovascular;  Laterality: N/A;  . Abdominal aortagram N/A 06/09/2012    Procedure: ABDOMINAL Maxcine Ham;  Surgeon: Elam Dutch, MD;  Location: [facility] CATH LAB;  Service: Cardiovascular;  Laterality: N/A;  . Insertion of dialysis catheter Right 04/19/2014    Procedure: INSERTION OF DIALYSIS CATHETER-INTERNAL JUGULAR;  Surgeon: Mal Misty, MD;  Location: [place];  Service: Vascular;  Laterality: Right;  . Av fistula placement Right 04/19/2014    Procedure: RIGHT ARM ARTERIOVENOUS (AV) FISTULA CREATION;  Surgeon: Mal Misty, MD;  Location: [place];  Service: Vascular;  Laterality: Right;   Social History:  reports that he quit smoking about 23 years ago. His smoking use included Cigarettes. He has a 30 pack-year smoking history. He has never used smokeless tobacco. He reports that he drinks alcohol. He reports that he does not use illicit drugs.  Family / Support Systems Marital Status: Married Patient Roles: Spouse, Parent Spouse/Significant Other: wife, Aaron Long Children: dtr, Aaron Long; son, Aaron Long  [facility]) @ [phone] and another son living in [place] Other Supports: nephew, Surveyor, quantity, lives with pt and wife and provides care to pt Anticipated Caregiver: wife works  but nephew, Garment/textile technologist" living with them for one year to assist pt Ability/Limitations of Caregiver: wife legally blind and works fulltime at [employer] for [employer] there 24/7 Caregiver Availability: 24/7 Family Dynamics: Pt describes supportive family and notes that wife's nephew has been a good support this past year as well.  Social History Preferred language: English Religion: Baptist Cultural Background: NA Read: Yes Write: Yes Employment Status: Retired Freight forwarder Issues: None Guardian/Conservator: None - per MD, pt capable of making decisions on his own behalf   Abuse/Neglect Physical Abuse: Denies Verbal Abuse: Denies Sexual Abuse: Denies Exploitation of patient/patient's resources: Denies Self-Neglect: Denies  Emotional Status Pt's affect, behavior adn adjustment status: Pt able to complete interview without difficulty, however, appears fatigued from txs.  He is realistic about his kidney disease and to start of HD "which I knew was coming..."  Denies any emotional distress, however, wife notes some  concern about his "mood" - will refer to neuropsychology for eval. Recent Psychosocial Issues: declining mobility/ strength due to worsening kidney disease Pyschiatric History: None Substance Abuse History: None  Patient / Family Perceptions, Expectations & Goals Pt/Family understanding of illness & functional limitations: Pt and family with general understanding and appreciation of medical issues and his current state of debility/ need for CIR. Premorbid pt/family roles/activities: mostly w/c level at home but was going to [facility] for prosthetic training. Anticipated changes in roles/activities/participation: little change anticipated as nephew was providing assistance at home  PTA Pt/family expectations/goals: "I just need to get my strength up."  Community Resources Premorbid Home Care/DME Agencies: Other (Comment) ([place] for amputee clinic) Transportation available at discharge: already using [facility] for transport to [facility] referrals recommended: Neuropsychology, Support group (specify)  Discharge Planning Living Arrangements: Spouse/significant other, Other relatives (wife's nephew) Support Systems: Spouse/significant other, Children, Other relatives Type of Residence: Private residence Insurance Resources: [employer], Multimedia programmer (specify) ([facility]) Financial Resources: Radio broadcast assistant Screen Referred: No Living Expenses: Education officer, community Management: Patient Does the patient have any problems obtaining your medications?: No Home Management: mostly wife and nephew Patient/Family Preliminary Plans: Pt plans to return to his home with nephew providing any needed assistance Expected length of stay: 10-14 days  Clinical Impression Significantly debilitated gentleman who is a new HD patient as well.  Good support from home which includes wife and wife's nephew who has ben providing assistance to pt and wife for approx one year.  Pt affect pretty flat, however, he denies any s/s of significant emotional distress.  Wife had expressed some concern about his mood - will refer to neuropsych as appropriate.   Michaelann Gunnoe 04/26/2014, 2:50

## 2014-04-29 NOTE — Consult Note (Signed)
Pt had a right radial cephalic AVF and diatek catheter placed by Dr. Kellie Simmering on 04/19/14.  He is now up in CIR.    He states that he started having numbness and coldness of his 4th and 5th fingers on the right hand and it has now progressed to all his fingers.  He states that he has numbness and his hand is cold.  He has no problems using his hand at this time.  Physical Exam: Equal hand grips bilaterally + audible right radial and ulnar signals Incision is healing nicely + thrill/bruit within the fistula  Assessment/Plan: Pt is s/p right radial cephalic AVF and diatek catheter placed by Dr. Kellie Simmering on 04/19/14  -He is having mild to moderate steal symptoms with numbness and coldness of his hand.  He has no pain. -his hand grips are equal bilaterally -may need ligation of his fistula eventually, but is tolerating these steal symptoms at this time.  He continues to have full motor/sensory function. -Dr. Donnetta Hutching to see pt later today.  Leontine Locket 04/29/2014 10:41 AM   I have examined the patient, reviewed and agree with above. Does appear to be having a mild steal in his right hand. Fortunately he is left-handed. He has normal grip strength bilaterally. His hand is not cold to the touch. He has a good flow through the fistula. Does report tolerable numbness and discomfort in his hand. Initially this was in the fourth and fifth finger and now is his whole hand. Do not see any indication of ligating his fistula. Will follow.  Mercia Dowe, MD 04/29/2014 2:22 PM

## 2014-04-29 NOTE — Progress Notes (Signed)
Occupational Therapy Session Note  Patient Details  Name: EVIAN SALGUERO MRN: [id number] Date of Birth: 08-19-1940  Today's Date: 04/29/2014 OT Individual Time: 6967-8938 OT Individual Time Calculation (min): 60 min    Short Term Goals: Week 1:  OT Short Term Goal 1 (Week 1): STG=LTG  Skilled Therapeutic Interventions/Progress Updates:    Pt seen for ADL bathing and dressing session. Pt in supine upon arrival, agreeable to tx. Pt required mod A for supine.EOB due to decreased UE strength and decreased dynamic balance. Pt sat EOB to eat breakfast, and bathed seated EOB with set up and supervision. Pt completed pericare/ buttock hygiene via lateral leans with supervision on EOB, demonstrating improved dynamic sitting balance since evaluation. Pt dressed EOB with set-up. He transferred EOB> w/c via sliding board with mod A. In w/c he donned prosthesis. He was then taken to therapy gym with total A for time. In gym, pt completed sit <> stand at parallel bars with mod A to stand, in prep for functional standing transfers. Pt tolerated~ 1 minute and 30 seconds of standing at each trial with min A. Pt returned to room at end of session, left sitting in w/c with all needs in reach.   Therapy Documentation Precautions:  Precautions Precautions: Fall Precaution Comments: bil BKA, new AV fitula RUE Required Braces or Orthoses: Other Brace/Splint (bil) Other Brace/Splint: bil proestheses Restrictions Weight Bearing Restrictions: No Other Position/Activity Restrictions: New AV fistula in R UE Pain Assessment Pain Assessment: No/denies pain  See FIM for current functional status  Therapy/Group: Individual Therapy  Lewis, Josaphine Shimamoto C 04/29/2014, 7:35 AM

## 2014-04-29 NOTE — Progress Notes (Signed)
Physical Therapy Session Note  Patient Details  Name: Aaron Long MRN: [id number] Date of Birth: [birth year]  Today's Date: 04/29/2014 PT Individual Time: 4097-3532 PT Individual Time Calculation (min): 30 min   Short Term Goals: Week 1:  PT Short Term Goal 1 (Week 1): STG=LTG due to LOS, S overall for bed mobility and transfers, Gait short distance with RW Min A  Skilled Therapeutic Interventions/Progress Updates:    Session focused on d/c planning, functional sit to stands to increase strengthening to aid with transfers from w/c, toilet transfer, education on HA-HA support group and skin inspection after wear of prosthesis throughout the day. Pt performed sit to stand x 5 reps with mod to max A from w/c and steady A once in standing for balance using RW for support. First Step magazine given to patient, but unable to locate HA-HA pamphlet this afternoon. Toilet transfers with min A and A to manage clothing from w/c. Pt reports he feels like he is at the same level he was before admission and eager to return home and restart his prosthetic training as an outpatient.   NT present to assist patient back to bed to prepare for HD transport.   Therapy Documentation Precautions:  Precautions Precautions: Fall Precaution Comments: bil BKA, new AV fitula RUE Required Braces or Orthoses: Other Brace/Splint (bil) Other Brace/Splint: bil proestheses Restrictions Weight Bearing Restrictions: No Other Position/Activity Restrictions: New AV fistula in R UE  Pain: Pain Assessment Pain Assessment: No/denies pain Pain Score: 0-No pain  See FIM for current functional status  Therapy/Group: Individual Therapy  Canary Brim Ivory Broad, PT, DPT  04/29/2014, 3:33 PM

## 2014-04-30 ENCOUNTER — Inpatient Hospital Stay (HOSPITAL_COMMUNITY): Payer: Medicare Other | Admitting: *Deleted

## 2014-04-30 ENCOUNTER — Inpatient Hospital Stay (HOSPITAL_COMMUNITY): Payer: Medicare Other | Admitting: Occupational Therapy

## 2014-04-30 ENCOUNTER — Inpatient Hospital Stay (HOSPITAL_COMMUNITY): Payer: Medicare Other

## 2014-04-30 LAB — GLUCOSE, CAPILLARY
GLUCOSE-CAPILLARY: 186 mg/dL — AB (ref 70–99)
GLUCOSE-CAPILLARY: 193 mg/dL — AB (ref 70–99)
GLUCOSE-CAPILLARY: 221 mg/dL — AB (ref 70–99)
Glucose-Capillary: 118 mg/dL — ABNORMAL HIGH (ref 70–99)
Glucose-Capillary: 168 mg/dL — ABNORMAL HIGH (ref 70–99)

## 2014-04-30 MED ORDER — TRAMADOL HCL 50 MG PO TABS
25.0000 mg | ORAL_TABLET | Freq: Two times a day (BID) | ORAL | Status: DC | PRN
  Administered 2014-04-30: 25 mg via ORAL
  Filled 2014-04-30: qty 1

## 2014-04-30 NOTE — Progress Notes (Signed)
Occupational Therapy Session Note  Patient Details  Name: Aaron Long MRN: [id number] Date of Birth: 1940/08/05  Today's Date: 04/30/2014 OT Individual Time: 1300-1400 OT Individual Time Calculation (min): 60 min    Short Term Goals: Week 1:  OT Short Term Goal 1 (Week 1): STG=LTG  Skilled Therapeutic Interventions/Progress Updates:    Pt seen for OT session focusing on functional mobility, functional standing balance, transfers, and UE strengthening/ ROM. Pt inw/c upon arrival, agreeable to tx, with B prosthesis donned. Pt self propelled w/c to ADL partment with supervision and 1 rest break. In ADL kitchen, pt stood from w/c > RW with mod A. He ambulated to kitchen counter where he reached over head to organize items in cabinet. Pt required steadying assist while reaching over head and across midline to complete task. Following~3 minutes of standing, pt requested seated rest break. Pt then taken into apartment bathroom where he ambulated ~19ft with RW to toilet with 3-1 BSC placed over toilet. Pt completed toilet transfers with mod A and VCs for hand placement. Pt then stood at [facility] and demonstrated ability to pull pants up/down with steadying assist without LOB. Pt then ambulated throughout OT apartment and completed bed mobility on standard bed with supervision. He required max A to stand from EOB due to low soft surface of bed. Pt required extended seated rest break following  Functional ambulation and mobility tasks. Pt then returned to w/c and self propelled ~50 yards to therapy gym. Pt completed pipe tree cognitive task from w/c level demonstrating functional use of B UEs and functional problem solving skills.   Pt left in therapy gym at end of session with pt waiting for recreational therapist.   Therapy Documentation Precautions:  Precautions Precautions: Fall Precaution Comments: bil BKA, new AV fitula RUE Required Braces or Orthoses: Other Brace/Splint Other Brace/Splint: bil  proestheses Restrictions Weight Bearing Restrictions: Yes Other Position/Activity Restrictions: New AV fistula in R UE Pain: Pain Assessment Pain Assessment: No/denies pain  See FIM for current functional status  Therapy/Group: Individual Therapy  Lewis, Jyasia Markoff C 04/30/2014, 1:43 PM

## 2014-04-30 NOTE — Progress Notes (Signed)
Subjective:   Right hand feeling better, did not feel well after dialysis yesterday and again this morning, no current complaints  Objective: Vital signs in last 24 hours: Temp:  [98.4 F (36.9 C)-99 F (37.2 C)] 99 F (37.2 C) (04/19 0524) Pulse Rate:  [70-80] 80 (04/19 0524) Resp:  [16-20] 20 (04/19 0524) BP: (95-146)/(47-76) 133/57 mmHg (04/19 0859) SpO2:  [94 %-98 %] 98 % (04/19 0524) Weight:  [85.1 kg (187 lb 9.8 oz)-87.3 kg (192 lb 7.4 oz)] 86.9 kg (191 lb 9.3 oz) (04/19 0500) Weight change:   Intake/Output from previous day: 04/18 0701 - 04/19 0700 In: 240 [P.O.:240] Out: 1000  Intake/Output this shift:   Lab Results:  Recent Labs  04/29/14 1628  WBC 9.5  HGB 9.3*  HCT 29.1*  PLT 274   BMET:  Recent Labs  04/29/14 1628  NA 131*  K 4.3  CL 93*  CO2 23  GLUCOSE 81  BUN 70*  CREATININE 9.61*  CALCIUM 8.8  ALBUMIN 3.3*   No results for input(s): PTH in the last 72 hours. Iron Studies: No results for input(s): IRON, TIBC, TRANSFERRIN, FERRITIN in the last 72 hours.  Studies/Results: No results found.   EXAM: General appearance:  Alert, in no apparent distress Resp:  CTA without rales, rhonchi, or wheezes Cardio:  RRR without murmur or rub GI: + BS, soft and nontender Extremities:   B BKA, no edema Access: R IJ catheter, AVF @ RFA with + bruit  HD: new start, getting HD MWF here, TTS as OP  Assessment/Plan: 1. New ESRD - started HD 4/8, outpatient HD @ [facility] on TTS pending. 2. Dialysis access - using R IJ catheter, AVF @ RFA placed by Dr. Kellie Simmering 4/8; mild to moderate steal symptoms evaluated by Dr. Donnetta Hutching yesterday, no indication to ligate fistula.  3. HTN/Volume - BP 133/57 on Amlodipine 10 qd, Doxazosin 1 mg qhs, Hydralazine 100 mg q8h; wt 86.9 kg s/p net UF 1 L yesterday.  4. Anemia - Hgb 9.3, Aranesp 60 mcg on Fri, Fe sat 11%, ferritin 95, start Fe loading. 5. Sec HPT - Ca 8.8 (9.4 corrected), P 7.9, iPTH 455; Hectorol 2 mcg.  Start  binder. 6. Nutrition - Alb 3.3, renal diet, binder. 7. Debility - B BKA, in rehab.   LOS: 5 days   LYLES,CHARLES 04/30/2014,10:34 AM   Pt seen, examined and agree w A/P as above.  Kelly Splinter MD pager [phone]    cell [phone] 04/30/2014, 12:29 PM

## 2014-04-30 NOTE — Progress Notes (Signed)
Recreational Therapy Session Note  Patient Details  Name: Aaron Long MRN: [id number] Date of Birth: 08-29-1940 Today's Date: 04/30/2014  Pain: no c/o Skilled Therapeutic Interventions/Progress Updates: Pt with c/o fatigue after previous therapy sessions but expressed interest in going outside.  LRT pushed pt in w/c outside Total assist due to time constraints and pt fatigue.  Once outside, discussion with pt about current therapies, goals and progress made.  Pt verbally processing through his various emotions about his medical history the last few years.  Pt states that he has been through a lot and is still surviving, that he is "here for a reason."  He is now ready to move beyond survival and to more actively engaged in rehab and recovery.  Discussion with pt about importance of strong support system in which pt states he has through family and friends.  Pt did state interest in more formalized support groups (Amputee Specific) and was introduced to a Dialysis specific support group.  Pt expressed interest in being more actively involved in the community through this groups.  Emotional support provided. Therapy/Group: Individual Therapy   Malasia Torain 04/30/2014, 3:32 PM

## 2014-04-30 NOTE — Progress Notes (Signed)
Occupational Therapy Session Note  Patient Details  Name: Aaron Long MRN: [id number] Date of Birth: [birth year]  Today's Date: 04/30/2014 OT Individual Time: 7948-0165 OT Individual Time Calculation (min): 75 min    Short Term Goals: Week 1:  OT Short Term Goal 1 (Week 1): STG=LTG  Skilled Therapeutic Interventions/Progress Updates:    Pt sitting EOB set up with breakfast upon arrival, agreeable to tx. Pt ate breakfast seated EOB with set-up. Education provided regarding POC and d/Long planning. Pt states he feels therapy is going well and that is getting back close to baseline level. Pt agreeable to showering task. He transferred EOB> w/Long via sliding board with min-mod A and VCs for technique.  Pt completed transfers into/ out of shower via sliding board onto tub transfer bench from w/Long. Pt completed bathing task with supervision and VCs for technique for lateral leans to complete buttock hygiene. Pt dressed LB from w/Long level with assist required to pull underwear up all the way. Pt then transferred w/Long> bed via sliding board with assist. Pt donned pants in supine with set-up and use of bed rails to assist with bed mobility. Pt left in supine at end of session, all needs in reach.    Therapy Documentation Precautions:  Precautions Precautions: Fall Precaution Comments: bil BKA, new AV fitula RUE Required Braces or Orthoses: Other Brace/Splint (bil) Other Brace/Splint: bil proestheses Restrictions Weight Bearing Restrictions: Yes Other Position/Activity Restrictions: New AV fistula in R UE Pain: Pain Assessment Pain Assessment: No/denies pain  See FIM for current functional status  Therapy/Group: Individual Therapy  Aaron Long 04/30/2014, 7:48 AM

## 2014-04-30 NOTE — Progress Notes (Addendum)
[facility] PHYSICAL MEDICINE & REHABILITATION     PROGRESS NOTE    Subjective/Complaints: "yesterday was rough" had HD and therapies. denies shortness of breath, palpitations, dizziness. Complains of phantom pain, more so at night.   Objective: Vital Signs: Blood pressure 135/55, pulse 80, temperature 99 F (37.2 C), temperature source Oral, resp. rate 20, weight 86.9 kg (191 lb 9.3 oz), SpO2 98 %. No results found.  Recent Labs  04/29/14 1628  WBC 9.5  HGB 9.3*  HCT 29.1*  PLT 274    Recent Labs  04/29/14 1628  NA 131*  K 4.3  CL 93*  GLUCOSE 81  BUN 70*  CREATININE 9.61*  CALCIUM 8.8   CBG (last 3)   Recent Labs  04/29/14 1123 04/29/14 2117 04/30/14 0657  GLUCAP 334* 90 118*    Wt Readings from Last 3 Encounters:  04/30/14 86.9 kg (191 lb 9.3 oz)  04/24/14 83.9 kg (184 lb 15.5 oz)  04/08/14 91.717 kg (202 lb 3.2 oz)    Physical Exam:  General: No acute distress,  Mood and affect are appropriate Heart: Regular rate and rhythm no rubs murmurs Lungs: Clear to auscultation, breathing unlabored, no rales or wheezes Abdomen: Positive bowel sounds, soft nontender to palpation, nondistended Extremities: No clubbing, cyanosis, or edema Skin: No evidence of breakdown, no evidence of rash Neurologic: Cranial nerves II through XII intact, motor strength isSensory exam normal sensation to light touch and proprioception in bilateral upperextremities , Lower extremities not tested secondary to amputations,UE 4- deltoid, 4/5 biceps, tricep, HI LE: 3/5 HF 4/5 ke. Some pain along left knee   Cerebellar exam normal finger to nose to finger as well as heel to shin in bilateral upper and lower extremities Musculoskeletal: Full range of motion in all 4 extremities. No joint swelling, Bilateral below-knee stumps well-healed Without pain to palpation, No edema Psych: in good spirits. alert.  Assessment/Plan: 1. Functional deficits secondary to debility due to renal  failure and multiple medical which require 3+ hours per day of interdisciplinary therapy in a comprehensive inpatient rehab setting. Physiatrist is providing close team supervision and 24 hour management of active medical problems listed below. Physiatrist and rehab team continue to assess barriers to discharge/monitor patient progress toward functional and medical goals.      FIM: FIM - Bathing Bathing Steps Patient Completed: Front perineal area, Buttocks, Left Arm, Right Arm, Abdomen, Chest, Right upper leg, Left upper leg Bathing: 5: Supervision: Safety issues/verbal cues  FIM - Upper Body Dressing/Undressing Upper body dressing/undressing steps patient completed: Thread/unthread right sleeve of pullover shirt/dresss, Thread/unthread left sleeve of pullover shirt/dress, Put head through opening of pull over shirt/dress, Pull shirt over trunk Upper body dressing/undressing: 5: Set-up assist to: Obtain clothing/put away FIM - Lower Body Dressing/Undressing Lower body dressing/undressing steps patient completed: Thread/unthread right pants leg, Thread/unthread left pants leg, Thread/unthread right underwear leg, Thread/unthread left underwear leg, Pull underwear up/down, Pull pants up/down Lower body dressing/undressing: 5: Supervision: Safety issues/verbal cues  FIM - Toileting Toileting steps completed by patient: Adjust clothing prior to toileting Toileting Assistive Devices: Grab bar or rail for support Toileting: 1: Two helpers  FIM - Radio producer Devices: Product manager Transfers: 0-Activity did not occur  FIM - Control and instrumentation engineer Devices: HOB elevated, Arm rests Bed/Chair Transfer: 3: Supine > Sit: Mod A (lifting assist/Pt. 50-74%/lift 2 legs, 4: Bed > Chair or W/C: Min A (steadying Pt. > 75%)  FIM - Locomotion: Wheelchair Distance: 100 Locomotion:  Wheelchair: 5: Travels 150 ft or more: maneuvers on rugs and over  door sills with supervision, cueing or coaxing FIM - Locomotion: Ambulation Locomotion: Ambulation Assistive Devices: Administrator, Prosthesis Ambulation/Gait Assistance: 4: Min assist Locomotion: Ambulation: 1: Travels less than 50 ft with minimal assistance (Pt.>75%)  Comprehension Comprehension Mode: Auditory Comprehension: 7-Follows complex conversation/direction: With no assist  Expression Expression Mode: Verbal Expression: 7-Expresses complex ideas: With no assist  Social Interaction Social Interaction: 7-Interacts appropriately with others - No medications needed.  Problem Solving Problem Solving: 5-Solves basic problems: With no assist  Memory Memory: 6-More than reasonable amt of time   Medical Problem List and Plan: 1. Functional deficits secondary to Debility due to renal failure new HD in a patient with recent BKA's 2. DVT Prophylaxis/Anticoagulation: Pharmaceutical: Lovenox 3. Neuropathy/Pain Management: Continue lyrica daily (max renal dose). Add LOW dose tramadol for breakthrough phantom limb pain 4. Mood: Team to provide ego support. LCSW to follow for evaluation and support.  5. Neuropsych: This patient is capable of making decisions on his own behalf. 6. Skin/Wound Care: Routine pressure relief measures. Monitor graft site for healing.  7. Fluids/Electrolytes/Nutrition: 1200 cc/FR daily to avoid overload.  Dietary ed.  8. DM type 2: Continue Levemir.   SSI for elevated BS. Will monitor BS with achs checks.   -added am dose of levemir ---no change for now as we follow for pattern 9. HTN: monitor BP every 8 hours. Continue Norvasc daily, cardura daily and apresoline tid.  10. Anemia of chronic disease: On aranesp daily 11. ESRD: To continue HD on TTS.  dialysis sessions occur at end of day to allow therapy and manage post procedure fatigue LOS (Days) 5 A FACE TO FACE EVALUATION WAS PERFORMED  SWARTZ,ZACHARY T 04/30/2014 8:17 AM

## 2014-04-30 NOTE — Progress Notes (Addendum)
  [facility] Progress Note  04/30/2014 3:57 PM  Subjective:  Right hand numbness improved. Denies hand pain.    Filed Vitals:   04/30/14 1448  BP: 138/64  Pulse: 81  Temp: 97.9 F (36.6 C)  Resp: 18    Physical Exam: Incisions:  Right wrist incision c/d/i Extremities:  Right radial-cephalic AVF with good thrill. Right hand is warm with good hand grip. Sensation in digits intact.   CBC    Component Value Date/Time   WBC 9.5 04/29/2014 1628   WBC 6.2 04/02/2014 1540   RBC 3.24* 04/29/2014 1628   RBC 3.89* 04/02/2014 1540   HGB 9.3* 04/29/2014 1628   HCT 29.1* 04/29/2014 1628   PLT 274 04/29/2014 1628   MCV 89.8 04/29/2014 1628   MCH 28.7 04/29/2014 1628   MCH 28.3 04/02/2014 1540   MCHC 32.0 04/29/2014 1628   MCHC 30.6* 04/02/2014 1540   RDW 14.2 04/29/2014 1628   RDW 14.9 04/02/2014 1540   LYMPHSABS 2.0 04/02/2014 1540   LYMPHSABS 2.0 04/20/2013 1610   MONOABS 0.6 04/20/2013 1610   EOSABS 0.2 04/02/2014 1540   EOSABS 0.2 04/20/2013 1610   BASOSABS 0.1 04/02/2014 1540   BASOSABS 0.0 04/20/2013 1610    BMET    Component Value Date/Time   NA 131* 04/29/2014 1628   NA 144 04/02/2014 1540   K 4.3 04/29/2014 1628   CL 93* 04/29/2014 1628   CO2 23 04/29/2014 1628   GLUCOSE 81 04/29/2014 1628   GLUCOSE 61* 04/02/2014 1540   BUN 70* 04/29/2014 1628   BUN 45* 04/02/2014 1540   CREATININE 9.61* 04/29/2014 1628   CALCIUM 8.8 04/29/2014 1628   GFRNONAA 5* 04/29/2014 1628   GFRAA 5* 04/29/2014 1628    INR    Component Value Date/Time   INR 1.08 06/19/2012 1200     Intake/Output Summary (Last 24 hours) at 04/30/14 1557 Last data filed at 04/30/14 1258  Gross per 24 hour  Intake    240 ml  Output   1000 ml  Net   -760 ml     Assessment/Plan:  74 y.o. male is s/p: right radial cephalic AV fistula and tunneled dialysis catheter placement by Dr. Kellie Simmering in 04/19/14 with probable mild steal in right hand. Motor and sensory function intact  and hand is warm and well perfused. Numbness is tolerable and improving. No indications to ligate fistula. Will not follow actively.    Virgina Jock, PA-C [facility] Office: [phone] Pager: [phone] 04/30/2014 3:57 PM  Agree with above Mild tingling---no pain  He was instructed to call our office if symptoms worsen

## 2014-04-30 NOTE — Progress Notes (Addendum)
Physical Therapy Session Note  Patient Details  Name: Aaron Long MRN: [id number] Date of Birth: 08/21/1940  Today's Date: 04/30/2014 PT Individual Time: 1010-1110 PT Individual Time Calculation (min): 60 min   Short Term Goals: Week 1:  PT Short Term Goal 1 (Week 1): STG=LTG due to LOS, S overall for bed mobility and transfers, Gait short distance with RW Min A  Skilled Therapeutic Interventions/Progress Updates:   Pt c/o some nausea, not feeling well, possibly due to HD last night.  PT informed RN.  Bed mobility in flat bed without rails, supervision, slowly.  Pt sat EOB and donned bil sockets, socks and prostheses slowly, independently except for 1 thin sock R limb, for which he needed cues to find wrinkles and pull out.  Lateral scoot transfer bed> w/c to R, min assist, with poor elevation of hips.  W/c propulsion x 150' using bil UEs, for activity tolerance.  Squat pivot w/c> mat level transfer with better elevation of hips, supervision.  therapeutic activities in supported sitting to facilitate trunk activation: shortening/lenthening/rotating for trunk righting and balance reactions, reaching out of BOS to R and L for 12 small objects.  Sit> stand from 23" high mat with min assist, pushing up with R hand, L hand on Rw; gait x 30' with RW, min guard assist.  Stand> sit with min guard assist.  Psychosocial support provided regarding pt's thoughts about attending amputee support.    Pt returned to room to rest in w/c before next therapy.  All needs within reach. Therapy Documentation Precautions:  Precautions Precautions: Fall Precaution Comments: bil BKA, new AV fitula RUE Required Braces or Orthoses: Other Brace/Splint Other Brace/Splint: bil proestheses Restrictions Weight Bearing Restrictions: Yes Other Position/Activity Restrictions: New AV fistula in R UE   Pain: Pain Assessment Pain Assessment: No/denies pain   Locomotion : Ambulation Ambulation/Gait  Assistance: 4: Min assist Wheelchair Mobility Distance: 150    See FIM for current functional status  Therapy/Group: Individual Therapy  [name] 04/30/2014, 12:34 PM

## 2014-05-01 ENCOUNTER — Inpatient Hospital Stay (HOSPITAL_COMMUNITY): Payer: Medicare Other

## 2014-05-01 ENCOUNTER — Inpatient Hospital Stay (HOSPITAL_COMMUNITY): Payer: Medicare Other | Admitting: Occupational Therapy

## 2014-05-01 ENCOUNTER — Encounter: Payer: Medicare Other | Admitting: Physical Therapy

## 2014-05-01 LAB — GLUCOSE, CAPILLARY
GLUCOSE-CAPILLARY: 129 mg/dL — AB (ref 70–99)
Glucose-Capillary: 162 mg/dL — ABNORMAL HIGH (ref 70–99)
Glucose-Capillary: 122 mg/dL — ABNORMAL HIGH (ref 70–99)
Glucose-Capillary: 240 mg/dL — ABNORMAL HIGH (ref 70–99)

## 2014-05-01 MED ORDER — INSULIN DETEMIR 100 UNIT/ML ~~LOC~~ SOLN
15.0000 [IU] | Freq: Every day | SUBCUTANEOUS | Status: DC
  Administered 2014-05-02: 15 [IU] via SUBCUTANEOUS
  Filled 2014-05-01 (×3): qty 0.15

## 2014-05-01 MED ORDER — CALCIUM ACETATE (PHOS BINDER) 667 MG PO CAPS
667.0000 mg | ORAL_CAPSULE | Freq: Three times a day (TID) | ORAL | Status: DC
  Administered 2014-05-01 – 2014-05-04 (×9): 667 mg via ORAL
  Filled 2014-05-01 (×12): qty 1

## 2014-05-01 NOTE — Progress Notes (Signed)
Physical Therapy Session Note  Patient Details  Name: Aaron Long MRN: [id number] Date of Birth: [birth year]  Today's Date: 05/01/2014 PT Individual Time: 0800-0900 PT Individual Time Calculation (min): 60 min   Short Term Goals: Week 1:  PT Short Term Goal 1 (Week 1): STG=LTG due to LOS, S overall for bed mobility and transfers, Gait short distance with RW Min A  Skilled Therapeutic Interventions/Progress Updates:   Mod I for supine to sit using bed rails and extra time. Pt donned bilateral prostheses seated EOB independently. Transfer to w/c with S and therapist stabilizing w/c using scoot pivot technique. Performed facewashing at sink with set-up assist from w/c. W/c propulsion on unit mod I with extra time for functional UE strengthening and endurance. Simulated car transfer with squat pivot and slideboard transfer technique with S to min A overall with cues for technique. Mostly min A to stabilize w/c from sliding and pt performing transfers at sedan height. Therapist attempted to adjust L brake on w/c due the wheel sliding, but unable due to the hardware. Recommended to stabilize w/c until this chair replaced. Practiced sit to stands x 2 at end of session from w/c with RW with min A and improved technique. Rest breaks needed during session but overall good participation.   Therapy Documentation Precautions:  Precautions Precautions: Fall Precaution Comments: bil BKA, new AV fitula RUE Required Braces or Orthoses: Other Brace/Splint Other Brace/Splint: bil proestheses Restrictions Weight Bearing Restrictions: Yes Other Position/Activity Restrictions: New AV fistula in R UE  Pain:  Denies pain.  See FIM for current functional status  Therapy/Group: Individual Therapy  Canary Brim Ivory Broad, PT, DPT  05/01/2014, 9:04 AM

## 2014-05-01 NOTE — Progress Notes (Signed)
Social Work Patient ID: Darrick Huntsman, male   DOB: [birth year], 74 y.o.   MRN: [id number]  Lowella Curb, LCSW Social Worker Signed  Patient Care Conference 05/01/2014  3:48 PM    Expand All Collapse All   Inpatient RehabilitationTeam Conference and Plan of Care Update Date: 04/30/2014   Time: 2:35 PM     Patient Name: Aaron Long       Medical Record Number: [id number]  Date of Birth: [birth year] Sex: Male         Room/Bed: 4W19C/4W19C-01 Payor Info: Payor: MEDICARE / Plan: MEDICARE PART A AND B / Product Type: *No Product type* /    Admitting Diagnosis: old b bka with prosthesis new esrd    Admit Date/Time:  04/25/2014  6:50 PM Admission Comments: No comment available   Primary Diagnosis:  <principal problem not specified> Principal Problem: <principal problem not specified>    Patient Active Problem List     Diagnosis  Date Noted   .  Physical deconditioning  04/25/2014   .  Syncope  04/17/2014   .  Uncontrolled hypertension     .  Chest pain  04/08/2014   .  Acute on chronic renal failure  04/08/2014   .  Hypertensive urgency  04/08/2014   .  Status post bilateral below knee amputation  04/02/2014   .  Other constipation  09/26/2013   .  Cervical disc disorder with radiculopathy of cervical region  08/07/2013   .  Drug-induced constipation  06/13/2013   .  DM type 2, uncontrolled, with renal complications  71/06/2692   .  Hypertensive renal disease  06/13/2013   .  Encounter for therapeutic drug monitoring  06/13/2013   .  DM type 2, uncontrolled, with neuropathy  01/23/2013   .  Hyperlipidemia with target LDL less than 100  12/06/2012   .  Need for prophylactic vaccination and inoculation against influenza  09/30/2012   .  Diabetes mellitus with peripheral vascular disease  09/06/2012   .  GERD (gastroesophageal reflux disease)  09/06/2012   .  Aftercare following surgery of the circulatory system, [place]  08/03/2012   .  Anemia in chronic kidney disease(285.21)   07/24/2012   .  Atherosclerosis of native arteries of the extremities with ulceration(440.23)  06/01/2012   .  Hyperkalemia  04/24/2012   .  Diabetic neuropathy, type II diabetes mellitus  07/27/2011   .  Atherosclerosis of native arteries of the extremities with intermittent claudication  07/08/2011   .  S/P BKA (below knee amputation)  06/22/2011   .  Atherosclerosis of native arteries of the extremities with gangrene  06/10/2011   .  CKD (chronic kidney disease) stage 4, GFR 15-29 ml/min  05/02/2011   .  HTN (hypertension)  04/28/2011   .  CAD (coronary artery disease)  04/28/2011   .  PAD (peripheral artery disease)  04/28/2011   .  Prostate cancer  04/28/2011     Expected Discharge Date: Expected Discharge Date: 05/04/14  Team Members Present: Physician leading conference: Dr. Alger Simons Social Worker Present: Lennart Pall, LCSW Nurse Present: Dorien Chihuahua, RN PT Present: Rada Hay, PT OT Present: Salome Spotted, OT;Jennifer Tamala Julian, OT;Other (comment) Napoleon Form, OT) SLP Present: Weston Anna, SLP Other (Discipline and Name): Danne Baxter, RN [facility]) PPS Coordinator present : Daiva Nakayama, RN, [facility]        Current Status/Progress  Goal  Weekly Team Focus   Medical     debility  related to ESRD/HD/multiple medical, bilateral BKA's    improve transfers/activity tolerance   renal mgt, wound care,    Bowel/Bladder     patient is continent of bowel and bladder(oliguric)  remain continent of bowel and bladder   educate patient about s/s of constipation   Swallow/Nutrition/ Hydration               ADL's     min A functional transfers from w/c level; mod-max to stand; supervision bathing and dressing; mod-max  toileting   min A functional transfers; mod toileting   Functional transfers; dynamic sitting balance    Mobility     min A w/c level for transfers; min A gait but mod to max A for sit to stand; S w/c mobility  mod I w/c level; min A gait short distance and min A car  transfer  d/c planning, transfer training, sit to stands, endurance and strengthening,    Communication               Safety/Cognition/ Behavioral Observations              Pain     patient denies pain  pain equal to or less than 4 on a scale of 0-10  assess pain q4h and medicate as indicated    Skin     stage II decubitis ulcer to medial sacrum   continued healing of current stage II and no new skin injury/breakdown  assess skin q shift and prn, reposition patient to off load pressure    Rehab Goals Patient on target to meet rehab goals: Yes *See Care Plan and progress notes for long and short-term goals.    Barriers to Discharge:  lack of prosthetic training/proficiency      Possible Resolutions to Barriers:   ongoing transfer training, new techinques, strength training      Discharge Planning/Teaching Needs:   home with wife and nephew able to provide 24/7 assistance        Team Discussion:    Pt making better progress over past few days.  Reaching baseline functioning.  Much better mobility when up with prostheses.  Currently amb 95' with min assist. Mood seem some better.  Continue to work on endurance and strengthening   Revisions to Treatment Plan:    None    Continued Need for Acute Rehabilitation Level of Care: The patient requires daily medical management by a physician with specialized training in physical medicine and rehabilitation for the following conditions: Daily direction of a multidisciplinary physical rehabilitation program to ensure safe treatment while eliciting the highest outcome that is of practical value to the patient.: Yes Daily medical management of patient stability for increased activity during participation in an intensive rehabilitation regime.: Yes Daily analysis of laboratory values and/or radiology reports with any subsequent need for medication adjustment of medical intervention for : Pulmonary problems;Neurological problems;Post surgical  problems  Donice Alperin 05/01/2014, 3:49 PM                 Lowella Curb, LCSW Social Worker Signed  Patient Care Conference 05/01/2014  3:48 PM    Expand All Collapse All   Inpatient RehabilitationTeam Conference and Plan of Care Update Date: 04/30/2014   Time: 2:35 PM     Patient Name: Aaron Long       Medical Record Number: [id number]  Date of Birth: 15-Mar-1940 Sex: Male         Room/Bed: 4W19C/4W19C-01 Payor Info: Payor: MEDICARE /  Plan: MEDICARE PART A AND B / Product Type: *No Product type* /    Admitting Diagnosis: old b bka with prosthesis new esrd    Admit Date/Time:  04/25/2014  6:50 PM Admission Comments: No comment available   Primary Diagnosis:  <principal problem not specified> Principal Problem: <principal problem not specified>    Patient Active Problem List     Diagnosis  Date Noted   .  Physical deconditioning  04/25/2014   .  Syncope  04/17/2014   .  Uncontrolled hypertension     .  Chest pain  04/08/2014   .  Acute on chronic renal failure  04/08/2014   .  Hypertensive urgency  04/08/2014   .  Status post bilateral below knee amputation  04/02/2014   .  Other constipation  09/26/2013   .  Cervical disc disorder with radiculopathy of cervical region  08/07/2013   .  Drug-induced constipation  06/13/2013   .  DM type 2, uncontrolled, with renal complications  17/40/8144   .  Hypertensive renal disease  06/13/2013   .  Encounter for therapeutic drug monitoring  06/13/2013   .  DM type 2, uncontrolled, with neuropathy  01/23/2013   .  Hyperlipidemia with target LDL less than 100  12/06/2012   .  Need for prophylactic vaccination and inoculation against influenza  09/30/2012   .  Diabetes mellitus with peripheral vascular disease  09/06/2012   .  GERD (gastroesophageal reflux disease)  09/06/2012   .  Aftercare following surgery of the circulatory system, [place]  08/03/2012   .  Anemia in chronic kidney disease(285.21)  07/24/2012   .  Atherosclerosis of  native arteries of the extremities with ulceration(440.23)  06/01/2012   .  Hyperkalemia  04/24/2012   .  Diabetic neuropathy, type II diabetes mellitus  07/27/2011   .  Atherosclerosis of native arteries of the extremities with intermittent claudication  07/08/2011   .  S/P BKA (below knee amputation)  06/22/2011   .  Atherosclerosis of native arteries of the extremities with gangrene  06/10/2011   .  CKD (chronic kidney disease) stage 4, GFR 15-29 ml/min  05/02/2011   .  HTN (hypertension)  04/28/2011   .  CAD (coronary artery disease)  04/28/2011   .  PAD (peripheral artery disease)  04/28/2011   .  Prostate cancer  04/28/2011     Expected Discharge Date: Expected Discharge Date: 05/04/14  Team Members Present: Physician leading conference: Dr. Alger Simons Social Worker Present: Lennart Pall, LCSW Nurse Present: Dorien Chihuahua, RN PT Present: Rada Hay, PT OT Present: Salome Spotted, OT;Jennifer Tamala Julian, OT;Other (comment) Napoleon Form, OT) SLP Present: Weston Anna, SLP Other (Discipline and Name): Danne Baxter, RN [facility]) PPS Coordinator present : Daiva Nakayama, RN, CRRN        Current Status/Progress  Goal  Weekly Team Focus   Medical     debility related to ESRD/HD/multiple medical, bilateral BKA's    improve transfers/activity tolerance   renal mgt, wound care,    Bowel/Bladder     patient is continent of bowel and bladder(oliguric)  remain continent of bowel and bladder   educate patient about s/s of constipation   Swallow/Nutrition/ Hydration               ADL's     min A functional transfers from w/c level; mod-max to stand; supervision bathing and dressing; mod-max  toileting   min A functional transfers; mod toileting   Functional  transfers; dynamic sitting balance    Mobility     min A w/c level for transfers; min A gait but mod to max A for sit to stand; S w/c mobility  mod I w/c level; min A gait short distance and min A car transfer  d/c planning, transfer  training, sit to stands, endurance and strengthening,    Communication               Safety/Cognition/ Behavioral Observations              Pain     patient denies pain  pain equal to or less than 4 on a scale of 0-10  assess pain q4h and medicate as indicated    Skin     stage II decubitis ulcer to medial sacrum   continued healing of current stage II and no new skin injury/breakdown  assess skin q shift and prn, reposition patient to off load pressure    Rehab Goals Patient on target to meet rehab goals: Yes *See Care Plan and progress notes for long and short-term goals.    Barriers to Discharge:  lack of prosthetic training/proficiency      Possible Resolutions to Barriers:   ongoing transfer training, new techinques, strength training      Discharge Planning/Teaching Needs:   home with wife and nephew able to provide 24/7 assistance        Team Discussion:    Pt making better progress over past few days.  Reaching baseline functioning.  Much better mobility when up with prostheses.  Currently amb 41' with min assist. Mood seem some better.  Continue to work on endurance and strengthening   Revisions to Treatment Plan:    None    Continued Need for Acute Rehabilitation Level of Care: The patient requires daily medical management by a physician with specialized training in physical medicine and rehabilitation for the following conditions: Daily direction of a multidisciplinary physical rehabilitation program to ensure safe treatment while eliciting the highest outcome that is of practical value to the patient.: Yes Daily medical management of patient stability for increased activity during participation in an intensive rehabilitation regime.: Yes Daily analysis of laboratory values and/or radiology reports with any subsequent need for medication adjustment of medical intervention for : Pulmonary problems;Neurological problems;Post surgical problems  Kerah Hardebeck 05/01/2014, 3:49  PM

## 2014-05-01 NOTE — Progress Notes (Signed)
Physical Therapy Session Note  Patient Details  Name: Aaron Long MRN: [id number] Date of Birth: [birth year]  Today's Date: 05/01/2014 PT Individual Time: 0930-1000 PT Individual Time Calculation (min): 30 min   Short Term Goals: Week 1:  PT Short Term Goal 1 (Week 1): STG=LTG due to LOS, S overall for bed mobility and transfers, Gait short distance with RW Min A  Skilled Therapeutic Interventions/Progress Updates:    Pt received seated in w/c, agreeable to participate in therapy. Session focused on functional endurance, gait training. Pt propelled w/c to/from rehab gym w/ mod (I). Instructed pt in multiple bouts of ambulation w/ RW 25', then 50', then 80'. Pt required MinA for sit<>stand then MinGuard for ambulation. Pt tolerated session well. Session ended in pt's room, where pt was left seated in w/c w/ all needs within reach.    Therapy Documentation Precautions:  Precautions Precautions: Fall Precaution Comments: bil BKA, new AV fitula RUE Required Braces or Orthoses: Other Brace/Splint Other Brace/Splint: bil proestheses Restrictions Weight Bearing Restrictions: Yes Other Position/Activity Restrictions: New AV fistula in R UE Pain: Pain Assessment Pain Assessment: No/denies pain  See FIM for current functional status  Therapy/Group: Individual Therapy  Rada Hay  Rada Hay, PT, DPT 05/01/2014, 7:46 AM

## 2014-05-01 NOTE — Progress Notes (Signed)
Occupational Therapy Session Note  Patient Details  Name: Aaron Long MRN: [id number] Date of Birth: Sep 11, 1940  Today's Date: 05/01/2014 OT Individual Time: 1000-1100 OT Individual Time Calculation (min): 60 min    Short Term Goals: Week 1:  OT Short Term Goal 1 (Week 1): STG=LTG  Skilled Therapeutic Interventions/Progress Updates:    Pt seen for OT session focusing on UE strength/ROM, and fine motor coordination. Pt sitting in w/c upon arrival ,agreeable to tx. Pt completed UE strengthening using level I theraband. Pt educated on importance of monitoring R UE closely due to fistula placement when completing strengthening exercises, pt voiced understanding. Seated in w/c pt completed theraband exercises, including elbow flexion/extenshion, shoulder AB/ADduction, internal/external rotation, and tripcep extensions. Pt completed each exercises x2 sets of 10 reps with rest break btwn sets. Pt required cues for proper form/positioning during first trial, and demonstrated competence and understanding of exercises with second set. Education and demonstration provided for theraputty exercises focusing on finger/ hand strengthening. Pt completed finger extension exercises x10 reps on each hand.    Pt completed grooming oral care task standing at the sink with overall supervision, requiring min-mod A to stand from w/c. Pt declined functional ambulation task, stating he was too fatigued from previous session, agreeable to w/c level activities. Pt taken outside with total A for time, and pt self-propelled w/c on outside terrace with assist required for w/c management/ safety when going outside down ramp. Education provided regarding safety for w/c management.  Pt taken back onto unit with total A. In room, pt transferred w/c> EOB with prosthesis donned with steadying assist. Pt doffed prosthesis and completed bed mobility mod I. Pt left in supine, bed alarm on, and all needs in reach.   Therapy  Documentation Precautions:  Precautions Precautions: Fall Precaution Comments: bil BKA, new AV fitula RUE Required Braces or Orthoses: Other Brace/Splint Other Brace/Splint: bil proestheses Restrictions Weight Bearing Restrictions: Yes Other Position/Activity Restrictions: New AV fistula in R UE Pain: Pain Assessment Pain Assessment: No/denies pain  See FIM for current functional status  Therapy/Group: Individual Therapy  Lewis, Laure Leone C 05/01/2014, 12:09 PM

## 2014-05-01 NOTE — Progress Notes (Signed)
Subjective:  No current complaints, right hand improving  Objective: Vital signs in last 24 hours: Temp:  [97.9 F (36.6 C)-98.7 F (37.1 C)] 98.7 F (37.1 C) (04/20 0500) Pulse Rate:  [74-81] 74 (04/20 0500) Resp:  [18] 18 (04/20 0500) BP: (128-138)/(45-66) 136/50 mmHg (04/20 0902) SpO2:  [97 %-100 %] 100 % (04/20 0500) Weight:  [87.9 kg (193 lb 12.6 oz)] 87.9 kg (193 lb 12.6 oz) (04/20 0500) Weight change: 2.4 kg (5 lb 4.7 oz)  Intake/Output from previous day: 04/19 0701 - 04/20 0700 In: 480 [P.O.:480] Out: -  Intake/Output this shift: Total I/O In: 360 [P.O.:360] Out: -   Lab Results:  Recent Labs  04/29/14 1628  WBC 9.5  HGB 9.3*  HCT 29.1*  PLT 274   BMET:  Recent Labs  04/29/14 1628  NA 131*  K 4.3  CL 93*  CO2 23  GLUCOSE 81  BUN 70*  CREATININE 9.61*  CALCIUM 8.8  ALBUMIN 3.3*   No results for input(s): PTH in the last 72 hours. Iron Studies: No results for input(s): IRON, TIBC, TRANSFERRIN, FERRITIN in the last 72 hours.  Studies/Results: No results found.   EXAM: General appearance: Alert, in no apparent distress Resp: CTA without rales, rhonchi, or wheezes Cardio: RRR without murmur or rub GI: + BS, soft and nontender Extremities: B BKA, no edema Access: R IJ catheter, AVF @ RFA with + bruit  HD: new start, getting HD MWF here, TTS as OP  Assessment/Plan: 1. New ESRD - started HD 4/8, outpatient HD @ [facility] on TTS pending. 2. Dialysis access - using R IJ catheter, AVF @ RFA placed by Dr. Kellie Simmering 4/8; mild to moderate steal symptoms evaluated by Dr. Donnetta Hutching 4/18, no indication to ligate fistula. 3. HTN/Volume - BP 136/50 on Amlodipine 10 qd, Doxazosin 1 mg qhs, Hydralazine 100 mg q8h; wt 87.9 kg, as low as 83 kg on 4/15, but tolerating poorly.  4. Anemia - Hgb 9.3, Aranesp 60 mcg on Fri, Fe sat 11%, ferritin 95, start Fe loading. 5. Sec HPT - Ca 8.8 (9.4 corrected), P 7.9, iPTH 455; Hectorol 2 mcg. Start binder. 6. Nutrition -  Alb 3.3, renal diet, binder. 1. Debility - B BKA, in rehab.    LOS: 6 days   Aaron Long,Aaron Long 05/01/2014,10:44 AM   Pt seen, examined, agree w assess/plan as above with additions as indicated.  Kelly Splinter MD pager [phone]    cell [phone] 05/01/2014, 1:09 PM

## 2014-05-01 NOTE — Progress Notes (Signed)
Occupational Therapy Session Note  Patient Details  Name: KATELYN KOHLMEYER MRN: [id number] Date of Birth: 07-09-1940  Today's Date: 05/01/2014 OT Individual Time: 1300-1400 OT Individual Time Calculation (min): 60 min    Short Term Goals: Week 1:  OT Short Term Goal 1 (Week 1): STG=LTG  Skilled Therapeutic Interventions/Progress Updates:    Pt seen for OT session focusing on functional standing balance, activity tolerance, and functional ambulation. Pt in supine upon arrival, agreeable to txx. Pt transferred supine> EOB mod I, and completed stand pivot transfer with use of RW to w/c. Pt taken to therapy gym total A for time and energy conservation. In therapy gym, pt transferred to mat with RW and mod A to stand from w/c. Pt completed sit <> stand transfers throughout session, initially requiring mod A to stand and requiring min A at end of session. Education and demonstration provided for sit <> stand techniques for increased success and safety. Pt stood to complete ball toss activity, requiring pt to balance while using B UEs in prep for standing ADL/IADL tasks. Pt required  Min-mod steadying assist and VCs to slow down in order to maintain standing balance. Pt tolerated ~1 minute of standing ball toss x3 sets with seated rest breaks btwn. Pt then sat on EOM and shot the basketball with supervision, demonstrating functional dynamic sitting balance. At end of session, pt ambulated ~25yard back towards room before requesting w/c assist back to room. Pt left in w/c at end of session, all needs in reach.    Education provided regarding POC, OT goals, and d/c planning.   Therapy Documentation Precautions:  Precautions Precautions: Fall Precaution Comments: bil BKA, new AV fitula RUE Required Braces or Orthoses: Other Brace/Splint Other Brace/Splint: bil proestheses Restrictions Weight Bearing Restrictions: Yes Other Position/Activity Restrictions: New AV fistula in R UE Pain: Pain  Assessment Pain Assessment: No/denies pain  See FIM for current functional status  Therapy/Group: Individual Therapy  Lewis, [place] Cloe C 05/01/2014, 12:59 PM

## 2014-05-01 NOTE — Progress Notes (Signed)
Social Work Patient ID: Aaron Long, male   DOB: 09-May-1940, 74 y.o.   MRN: [id number]  Have reviewed team conference with pt and wife (via phone).  Both aware and agreeable with targeted d/c date of 4/23 and aware that pt will likely receive HD at hospital prior to leaving on that day.  Have also spoken with RN at [facility] to confirm his TTh[facility] outpatient schedule.  Wife plans to accompany pt on the first day of his HD (next Tuesday).  Will use [facility] following that.  Also working with [facility] to switch out his current w/c with better fitting w/c and hope to have this done prior to d/c.  Continue to follow.  Lucile Hillmann, LCSW

## 2014-05-01 NOTE — Progress Notes (Signed)
[facility]     PROGRESS NOTE    Subjective/Complaints: Slept well. Tolerating therapies. Happy with progress. Getting back close to baseline. denies shortness of breath, palpitations, dizziness. Complains of phantom pain, more so at night.   Objective: Vital Signs: Blood pressure 138/66, pulse 74, temperature 98.7 F (37.1 C), temperature source Oral, resp. rate 18, weight 87.9 kg (193 lb 12.6 oz), SpO2 100 %. No results found.  Recent Labs  04/29/14 1628  WBC 9.5  HGB 9.3*  HCT 29.1*  PLT 274    Recent Labs  04/29/14 1628  NA 131*  K 4.3  CL 93*  GLUCOSE 81  BUN 70*  CREATININE 9.61*  CALCIUM 8.8   CBG (last 3)   Recent Labs  04/30/14 2048 04/30/14 2133 05/01/14 0642  GLUCAP 193* 221* 129*    Wt Readings from Last 3 Encounters:  05/01/14 87.9 kg (193 lb 12.6 oz)  04/24/14 83.9 kg (184 lb 15.5 oz)  04/08/14 91.717 kg (202 lb 3.2 oz)    Physical Exam:  General: No acute distress,  Mood and affect are appropriate Heart: Regular rate and rhythm no rubs murmurs Lungs: Clear to auscultation, breathing unlabored, no rales or wheezes Abdomen: Positive bowel sounds, soft nontender to palpation, nondistended Extremities: No clubbing, cyanosis, or edema Skin: No evidence of breakdown, no evidence of rash Neurologic: Cranial nerves II through XII intact, motor strength isSensory exam normal sensation to light touch and proprioception in bilateral upperextremities , Lower extremities not tested secondary to amputations,UE 4- deltoid, 4/5 biceps, tricep, HI LE: 3/5 HF 4/5 ke. Some pain along left knee   Cerebellar exam normal finger to nose to finger as well as heel to shin in bilateral upper and lower extremities Musculoskeletal: Full range of motion in all 4 extremities. No joint swelling, Bilateral below-knee stumps well-healed Without pain to palpation, No edema Psych: in good spirits. alert.  Assessment/Plan: 1.  Functional deficits secondary to debility due to renal failure and multiple medical which require 3+ hours per day of interdisciplinary therapy in a comprehensive inpatient rehab setting. Physiatrist is providing close team supervision and 24 hour management of active medical problems listed below. Physiatrist and rehab team continue to assess barriers to discharge/monitor patient progress toward functional and medical goals.      FIM: FIM - Bathing Bathing Steps Patient Completed: Front perineal area, Buttocks, Left Arm, Right Arm, Abdomen, Chest, Right upper leg, Left upper leg Bathing: 5: Supervision: Safety issues/verbal cues  FIM - Upper Body Dressing/Undressing Upper body dressing/undressing steps patient completed: Thread/unthread right sleeve of pullover shirt/dresss, Thread/unthread left sleeve of pullover shirt/dress, Put head through opening of pull over shirt/dress, Pull shirt over trunk Upper body dressing/undressing: 5: Set-up assist to: Obtain clothing/put away FIM - Lower Body Dressing/Undressing Lower body dressing/undressing steps patient completed: Thread/unthread right pants leg, Thread/unthread left pants leg, Thread/unthread right underwear leg, Thread/unthread left underwear leg, Pull pants up/down Lower body dressing/undressing: 4: Min-Patient completed 75 plus % of tasks  FIM - Toileting Toileting steps completed by patient: Adjust clothing prior to toileting Toileting Assistive Devices: Grab bar or rail for support Toileting: 1: Two helpers  FIM - Radio producer Devices: Product manager Transfers: 4-To toilet/BSC: Min A (steadying Pt. > 75%), 4-From toilet/BSC: Min A (steadying Pt. > 75%)  FIM - Bed/Chair Transfer Bed/Chair Transfer Assistive Devices: HOB elevated, Arm rests Bed/Chair Transfer: 5: Supine > Sit: Supervision (verbal cues/safety issues), 4: Chair or W/C > Bed:  Min A (steadying Pt. > 75%), 2: Bed > Chair or W/C: Max A  (lift and lower assist)  FIM - Locomotion: Wheelchair Distance: 150 Locomotion: Wheelchair: 5: Travels 150 ft or more: maneuvers on rugs and over door sills with supervision, cueing or coaxing FIM - Locomotion: Ambulation Locomotion: Ambulation Assistive Devices: Administrator, Prosthesis Ambulation/Gait Assistance: 4: Min assist Locomotion: Ambulation: 1: Travels less than 50 ft with minimal assistance (Pt.>75%)  Comprehension Comprehension Mode: Auditory Comprehension: 7-Follows complex conversation/direction: With no assist  Expression Expression Mode: Verbal Expression: 7-Expresses complex ideas: With no assist  Social Interaction Social Interaction: 7-Interacts appropriately with others - No medications needed.  Problem Solving Problem Solving: 5-Solves basic problems: With no assist  Memory Memory: 6-More than reasonable amt of time   Medical Problem List and Plan: 1. Functional deficits secondary to Debility due to renal failure new HD in a patient with recent BKA's 2. DVT Prophylaxis/Anticoagulation: Pharmaceutical: Lovenox 3. Neuropathy/Pain Management: Continue lyrica daily (max renal dose). Add LOW dose tramadol for breakthrough phantom limb pain 4. Mood: Team to provide ego support. LCSW to follow for evaluation and support.  5. Neuropsych: This patient is capable of making decisions on his own behalf. 6. Skin/Wound Care: Routine pressure relief measures. Monitor graft site for healing.  7. Fluids/Electrolytes/Nutrition: 1200 cc/FR daily to avoid overload.  Dietary ed.  8. DM type 2: Continue Levemir.   SSI for elevated BS. Will monitor BS with achs checks.   -added am dose of levemir--increase to 15u --  9. HTN: monitor BP every 8 hours. Continue Norvasc daily, cardura daily and apresoline tid.  10. Anemia of chronic disease: On aranesp daily 11. ESRD: To continue HD on TTS.  dialysis sessions occur at end of day to allow therapy and manage post  procedure fatigue LOS (Days) 6 A FACE TO FACE EVALUATION WAS PERFORMED  SWARTZ,ZACHARY T 05/01/2014 8:25 AM

## 2014-05-01 NOTE — Patient Care Conference (Signed)
Inpatient RehabilitationTeam Conference and Plan of Care Update Date: 04/30/2014   Time: 2:35 PM    Patient Name: Aaron Long      Medical Record Number: [id number]  Date of Birth: 27-Jan-1940 Sex: Male         Room/Bed: 4W19C/4W19C-01 Payor Info: Payor: MEDICARE / Plan: MEDICARE PART A AND B / Product Type: *No Product type* /    Admitting Diagnosis: old b bka with prosthesis new esrd   Admit Date/Time:  04/25/2014  6:50 PM Admission Comments: No comment available   Primary Diagnosis:  <principal problem not specified> Principal Problem: <principal problem not specified>  Patient Active Problem List   Diagnosis Date Noted  . Physical deconditioning 04/25/2014  . Syncope 04/17/2014  . Uncontrolled hypertension   . Chest pain 04/08/2014  . Acute on chronic renal failure 04/08/2014  . Hypertensive urgency 04/08/2014  . Status post bilateral below knee amputation 04/02/2014  . Other constipation 09/26/2013  . Cervical disc disorder with radiculopathy of cervical region 08/07/2013  . Drug-induced constipation 06/13/2013  . DM type 2, uncontrolled, with renal complications 33/00/7622  . Hypertensive renal disease 06/13/2013  . Encounter for therapeutic drug monitoring 06/13/2013  . DM type 2, uncontrolled, with neuropathy 01/23/2013  . Hyperlipidemia with target LDL less than 100 12/06/2012  . Need for prophylactic vaccination and inoculation against influenza 09/30/2012  . Diabetes mellitus with peripheral vascular disease 09/06/2012  . GERD (gastroesophageal reflux disease) 09/06/2012  . Aftercare following surgery of the circulatory system, [place] 08/03/2012  . Anemia in chronic kidney disease(285.21) 07/24/2012  . Atherosclerosis of native arteries of the extremities with ulceration(440.23) 06/01/2012  . Hyperkalemia 04/24/2012  . Diabetic neuropathy, type II diabetes mellitus 07/27/2011  . Atherosclerosis of native arteries of the extremities with intermittent claudication  07/08/2011  . S/P BKA (below knee amputation) 06/22/2011  . Atherosclerosis of native arteries of the extremities with gangrene 06/10/2011  . CKD (chronic kidney disease) stage 4, GFR 15-29 ml/min 05/02/2011  . HTN (hypertension) 04/28/2011  . CAD (coronary artery disease) 04/28/2011  . PAD (peripheral artery disease) 04/28/2011  . Prostate cancer 04/28/2011    Expected Discharge Date: Expected Discharge Date: 05/04/14  Team Members Present: Physician leading conference: Dr. Alger Simons Social Worker Present: Lennart Pall, LCSW Nurse Present: Dorien Chihuahua, RN PT Present: Rada Hay, PT OT Present: Salome Spotted, OT;Jennifer Tamala Julian, OT;Other (comment) Napoleon Form, OT) SLP Present: Weston Anna, SLP Other (Discipline and Name): Danne Baxter, RN [facility]) PPS Coordinator present : Daiva Nakayama, RN, CRRN     Current Status/Progress Goal Weekly Team Focus  Medical   debility related to ESRD/HD/multiple medical, bilateral BKA's   improve transfers/activity tolerance  renal mgt, wound care,    Bowel/Bladder   patient is continent of bowel and bladder(oliguric)  remain continent of bowel and bladder  educate patient about s/s of constipation   Swallow/Nutrition/ Hydration             ADL's   min A functional transfers from w/c level; mod-max to stand; supervision bathing and dressing; mod-max  toileting  min A functional transfers; mod toileting  Functional transfers; dynamic sitting balance   Mobility   min A w/c level for transfers; min A gait but mod to max A for sit to stand; S w/c mobility  mod I w/c level; min A gait short distance and min A car transfer  d/c planning, transfer training, sit to stands, endurance and strengthening,    Communication  Safety/Cognition/ Behavioral Observations            Pain   patient denies pain  pain equal to or less than 4 on a scale of 0-10  assess pain q4h and medicate as indicated   Skin   stage II decubitis ulcer to  medial sacrum  continued healing of current stage II and no new skin injury/breakdown  assess skin q shift and prn, reposition patient to off load pressure    Rehab Goals Patient on target to meet rehab goals: Yes *See Care Plan and progress notes for long and short-term goals.  Barriers to Discharge: lack of prosthetic training/proficiency    Possible Resolutions to Barriers:  ongoing transfer training, new techinques, strength training    Discharge Planning/Teaching Needs:  home with wife and nephew able to provide 24/7 assistance      Team Discussion:  Pt making better progress over past few days.  Reaching baseline functioning.  Much better mobility when up with prostheses.  Currently amb 92' with min assist. Mood seem some better.  Continue to work on endurance and strengthening  Revisions to Treatment Plan:  None   Continued Need for Acute Rehabilitation Level of Care: The patient requires daily medical management by a physician with specialized training in physical medicine and rehabilitation for the following conditions: Daily direction of a multidisciplinary physical rehabilitation program to ensure safe treatment while eliciting the highest outcome that is of practical value to the patient.: Yes Daily medical management of patient stability for increased activity during participation in an intensive rehabilitation regime.: Yes Daily analysis of laboratory values and/or radiology reports with any subsequent need for medication adjustment of medical intervention for : Pulmonary problems;Neurological problems;Post surgical problems  Aaron Long 05/01/2014, 3:49 PM

## 2014-05-02 ENCOUNTER — Encounter: Payer: Medicare Other | Admitting: Physician Assistant

## 2014-05-02 ENCOUNTER — Inpatient Hospital Stay (HOSPITAL_COMMUNITY): Payer: Medicare Other

## 2014-05-02 ENCOUNTER — Inpatient Hospital Stay (HOSPITAL_COMMUNITY): Payer: Medicare Other | Admitting: Occupational Therapy

## 2014-05-02 ENCOUNTER — Encounter (HOSPITAL_COMMUNITY): Payer: Medicare Other

## 2014-05-02 DIAGNOSIS — F5102 Adjustment insomnia: Secondary | ICD-10-CM | POA: Diagnosis present

## 2014-05-02 DIAGNOSIS — D638 Anemia in other chronic diseases classified elsewhere: Secondary | ICD-10-CM | POA: Diagnosis present

## 2014-05-02 LAB — CBC
HEMATOCRIT: 29.4 % — AB (ref 39.0–52.0)
HEMOGLOBIN: 9.2 g/dL — AB (ref 13.0–17.0)
MCH: 28.6 pg (ref 26.0–34.0)
MCHC: 31.3 g/dL (ref 30.0–36.0)
MCV: 91.3 fL (ref 78.0–100.0)
Platelets: 306 10*3/uL (ref 150–400)
RBC: 3.22 MIL/uL — AB (ref 4.22–5.81)
RDW: 14.5 % (ref 11.5–15.5)
WBC: 7.7 10*3/uL (ref 4.0–10.5)

## 2014-05-02 LAB — GLUCOSE, CAPILLARY
GLUCOSE-CAPILLARY: 148 mg/dL — AB (ref 70–99)
GLUCOSE-CAPILLARY: 85 mg/dL (ref 70–99)
Glucose-Capillary: 197 mg/dL — ABNORMAL HIGH (ref 70–99)
Glucose-Capillary: 131 mg/dL — ABNORMAL HIGH (ref 70–99)

## 2014-05-02 LAB — RENAL FUNCTION PANEL
ALBUMIN: 3.1 g/dL — AB (ref 3.5–5.2)
Anion gap: 12 (ref 5–15)
BUN: 46 mg/dL — ABNORMAL HIGH (ref 6–23)
CO2: 27 mmol/L (ref 19–32)
Calcium: 8.9 mg/dL (ref 8.4–10.5)
Chloride: 95 mmol/L — ABNORMAL LOW (ref 96–112)
Creatinine, Ser: 7.58 mg/dL — ABNORMAL HIGH (ref 0.50–1.35)
GFR calc Af Amer: 7 mL/min — ABNORMAL LOW (ref >90)
GFR calc non Af Amer: 6 mL/min — ABNORMAL LOW (ref >90)
GLUCOSE: 139 mg/dL — AB (ref 70–99)
PHOSPHORUS: 6.7 mg/dL — AB (ref 2.3–4.6)
Potassium: 4.2 mmol/L (ref 3.5–5.1)
Sodium: 134 mmol/L — ABNORMAL LOW (ref 135–145)

## 2014-05-02 MED ORDER — PENTAFLUOROPROP-TETRAFLUOROETH EX AERO: 1.0000 "application " | INHALATION_SPRAY | CUTANEOUS | Status: DC | PRN

## 2014-05-02 MED ORDER — DOXERCALCIFEROL 4 MCG/2ML IV SOLN
2.0000 ug | INTRAVENOUS | Status: DC
  Administered 2014-05-02 – 2014-05-04 (×2): 2 ug via INTRAVENOUS
  Filled 2014-05-02: qty 2

## 2014-05-02 MED ORDER — PENTAFLUOROPROP-TETRAFLUOROETH EX AERO
1.0000 | INHALATION_SPRAY | CUTANEOUS | Status: DC | PRN
Start: 2014-05-02 — End: 2014-05-02

## 2014-05-02 MED ORDER — LIDOCAINE HCL (PF) 1 % IJ SOLN: 5.0000 mL | INTRAMUSCULAR | Status: DC | PRN

## 2014-05-02 MED ORDER — LIDOCAINE-PRILOCAINE 2.5-2.5 % EX CREA
1.0000 "application " | TOPICAL_CREAM | CUTANEOUS | Status: DC | PRN
  Filled 2014-05-02: qty 5

## 2014-05-02 MED ORDER — SODIUM CHLORIDE 0.9 % IV SOLN
125.0000 mg | INTRAVENOUS | Status: DC
  Administered 2014-05-02 – 2014-05-04 (×2): 125 mg via INTRAVENOUS
  Filled 2014-05-02 (×3): qty 10

## 2014-05-02 MED ORDER — SODIUM CHLORIDE 0.9 % IV SOLN: 100.0000 mL | INTRAVENOUS | Status: DC | PRN

## 2014-05-02 MED ORDER — NEPRO/CARBSTEADY PO LIQD: 237.0000 mL | ORAL | Status: DC | PRN

## 2014-05-02 MED ORDER — DOXERCALCIFEROL 4 MCG/2ML IV SOLN
INTRAVENOUS | Status: AC
  Filled 2014-05-02: qty 2

## 2014-05-02 MED ORDER — ALTEPLASE 2 MG IJ SOLR
2.0000 mg | Freq: Once | INTRAMUSCULAR | Status: DC | PRN
  Filled 2014-05-02: qty 2

## 2014-05-02 MED ORDER — HEPARIN SODIUM (PORCINE) 1000 UNIT/ML DIALYSIS
2000.0000 [IU] | INTRAMUSCULAR | Status: DC | PRN
  Filled 2014-05-02: qty 2

## 2014-05-02 MED ORDER — HEPARIN SODIUM (PORCINE) 1000 UNIT/ML DIALYSIS
1000.0000 [IU] | INTRAMUSCULAR | Status: DC | PRN
  Filled 2014-05-02: qty 1

## 2014-05-02 NOTE — Progress Notes (Signed)
Recreational Therapy Session Note  Patient Details  Name: Aaron Long MRN: [id number] Date of Birth: Nov 18, 1940 Today's Date: 05/02/2014  Pain: no c/o Skilled Therapeutic Interventions/Progress Updates: Session focused on activity tolerance, functional mobility and obstacle course negotiation/community reintegration skills.  Pt performed w/c mobility negotiation obstacles and going over various surface types and up and down a ramp with supervision-min assist.  Min assist needed for mobility on the ramp along with verbal cues for safety.  Discussed pt previously expressed interest in support groups and provided written information on local groups for use post discharge.  Pt stated appreciation.    Therapy/Group: Co-Treatment 05/02/2014, 4:51 PM

## 2014-05-02 NOTE — Progress Notes (Signed)
Recreational Therapy Discharge Summary Patient Details  Name: Aaron Long MRN: [id number] Date of Birth: [birth year] Today's Date: 05/02/2014  Long term goals set: 1  Long term goals met: 1  Comments on progress toward goals: Pt has made great progress toward goal and is ready to discharge home at overall Mod I w/c level for simple TR tasks.  Education and information provided on community resources/support groups per pt request.    Reasons for discharge: discharge from hospital   Patient/family agrees with progress made and goals achieved: Yes  Meiya Wisler 05/02/2014, 4:55 PM

## 2014-05-02 NOTE — Consult Note (Signed)
NEUROCOGNITIVE STATUS EXAMINATION - [place]   [name] is a 74 year old man, who was seen for a brief neurocognitive status examination to evaluate his emotional state and mental status in the setting of renal failure.  According to his medical record, he was admitted on 04/17/14 with syncope, tremors, and recurrent hyperkalemia.  CT of his head was negative for acute changes.  Ultimately, dialysis was recommended for ongoing treatment.  According to staff members, Aaron Long was having difficulty with emotional adjustment to treatment with dialysis and therefore, a neuropsychological consult was requested.    Emotional Functioning:  During the clinical interview, Aaron Long described feeling "pretty good" overall.  He confessed that initially upon learning that he would have to be on dialysis, he wanted to "give up."  However, he said that he spoke with family and friends who reminded him that he is "not a quitter" and that he was able to renew his motivation to do the best that he can and "not look back."  He currently denied suicidal ideation, intent, or plan.  Time was spent discussing coping strategies that he can employ in the future to assist him if he experiences low mood or desire to give up again in the future and he said that primarily, he plans to talk to his wife for support.  He also commented that he feels as though he has been through so much lately that he can "cope with anything now."  Aaron Long said that he feels ready to discharge and plans to take one step at a time.  He denied current depressive symptoms and his responses to self-report measures of mood symptoms were not suggestive of the presence of clinically significant depression or anxiety at this time.  Education was provided regarding risk for development of depression in the future and he agreed to inform his care team should he feel that his mood is worsening or persistently  low.    Mental Status:  [name] total score on a very brief measure of mental status was not suggestive of marked cognitive disruption, at the level of dementia (MMSE-2 brief = 12/16).  He had difficulty initially encoding 3 words into memory, though it seemed to be related to trouble hearing, as he was repeating words that sounded similar to those that were actually being presented.  After 3 presentations, he was able to correctly repeat all 3 words.  Following a brief delay, he was able to freely recall 2 of 3 previously studied words.  He lost one other point for misstating the date.  Subjectively, he denied noticing cognitive changes.    Impressions and Recommendations:  Aaron Long score on a brief mental status measure was not suggestive of significant cognitive disruption at this time.  However, there is risk for development of cognitive changes on long-term dialysis treatment and therefore, if he should experience cognitive changes in the future, it would be beneficial for him to undergo a comprehensive neuropsychological evaluation.  From an emotional standpoint, it seems like he initially had an adjustment reaction with depressed mood upon learning of his required treatment.  However, currently, he seems to be coping well and understood the risks for worsening mood going forward and was agreeable to informing his care team should he begin to develop symptoms of persisting depressed mood.  Continued follow-up with the neuropsychologist for support could be provided should his treatment team feel that it would be beneficial in informing care.  DIAGNOSIS:   Chronic Kidney Disease Physical deconditioning  Aaron Long, Psy.D.  Clinical Neuropsychologist

## 2014-05-02 NOTE — Progress Notes (Signed)
Occupational Therapy Session Note  Patient Details  Name: Aaron Long MRN: [id number] Date of Birth: 22-Aug-1940  Today's Date: 05/02/2014 OT Individual Time: 1100-1200 and 1300-1400 OT Individual Time Calculation (min): 120 min total (2 60 minute sessions)    Short Term Goals: Week 1:  OT Short Term Goal 1 (Week 1): STG=LTG  Skilled Therapeutic Interventions/Progress Updates:    Session One: Pt seen for OT ADL bathing and dressing session. Pt in w/c upon arrival, agreeable to tx. Pt denied pain however voiced soreness and fatigue from previous session. Pt completed bathing and dressing session from w/c level set-up at the sink. Pt bathed and dressed UB with set-up. He stood with prosthesis donned to pull pants down and complete pericare/buttock hygiene. He then sat and doffed prosthesis, threaded B LEs into pants and underwear, donned prosthesis, and stood at sink to pull pants up. Pt stood at sink with min A and steadying assist to pull pants up. Education provided regarding modified dressing techniques, and pt voiced liking dressing in this manner greater than dressing in supine as he did upon admission.    Pt then self propelled w/c to therapy ortho gym mod I with increased time. Pt completed transfer w/c> therapy mat via stand pivot transfer at [facility] with min A. Pt completed standing golf putting task, focusing on functional standing balance while using B UEs. Pt required min-mod steadying assist to complete task. Pt then completed standing horse shoe toss standing at RW, able to use B UEs to toss and then would hold on to RW intermittently. Pt declined attempt of sit<> stand  Without use of RW, as he was fearful he wouldn't have something to hold on to. Pt with increased fatigue throughout session, initially tolerating ~3 minutes of standing task and by end of session tolerating ~1 minute of standing before requesting seated rest break. At end of session, pt self propelled w/c back to room  using B UEs and LEs. Pt left in w/c at end of session, all needs in reach.    Education provided regarding POC, energy conservation, and d/c planning.   Session Two: Pt seen for OT session focusing on functional transfers, functional endurance and UE strengthening/ ROM. Pt in w/c upon arrival finishing lunch and agreeable to therapy. Pt taken to therapy gym with total A for energy conservation. Pt transferred w/c> therapy mat via ~5 steps with supervision, requiring steadying assist for sit <> stand. Pt initially began sit <> stand task, however following 2 sit <> stands with min-mod A, pt voiced being too fatigued to complete task. Pt then transferred to supine on the mat with supervision and complete UE strengthening exercises in supine including shoulder flexion, tricep extentions, and AB/ADduction to the R and L. Each exercise completed x2 sets of 10 reps. Pt then layed on foam mat, required to come into partial sitting to place clothes pin on tree to increase abdominal strength needed for functional stability. At end of session, pt ambulated ~ 65ft with RW and CGA before requesting seated rest break. Pt taken in w/c remainder of way to room where he transferred w/c> bed via RW and supervision. Pt doffed prosthesis and returned to supine with supervision. Pt left in supine with all needs in reach.   Therapy Documentation Precautions:  Precautions Precautions: Fall Precaution Comments: bil BKA, new AV fitula RUE Required Braces or Orthoses: Other Brace/Splint Other Brace/Splint: bil proestheses Restrictions Weight Bearing Restrictions: Yes Other Position/Activity Restrictions: New AV fistula in R UE  Pain Assessment Pain Assessment: No/denies pain  See FIM for current functional status  Therapy/Group: Individual Therapy  Lewis, Devoiry Corriher C 05/02/2014, 7:28 AM

## 2014-05-02 NOTE — Progress Notes (Signed)
Subjective:  No current complaints, anticipates discharge after dialysis 4/23  Objective: Vital signs in last 24 hours: Temp:  [97.6 F (36.4 C)-98.8 F (37.1 C)] 97.6 F (36.4 C) (04/21 0615) Pulse Rate:  [80-81] 81 (04/21 0615) Resp:  [18] 18 (04/21 0615) BP: (117-144)/(49-60) 127/56 mmHg (04/21 0615) SpO2:  [97 %-99 %] 99 % (04/21 0615) Weight change:   Intake/Output from previous day: 04/20 0701 - 04/21 0700 In: 720 [P.O.:720] Out: -  Intake/Output this shift: Total I/O In: 120 [P.O.:120] Out: -   Lab Results:  Recent Labs  04/29/14 1628 05/02/14 0659  WBC 9.5 7.7  HGB 9.3* 9.2*  HCT 29.1* 29.4*  PLT 274 306   BMET:  Recent Labs  04/29/14 1628 05/02/14 0653  NA 131* 134*  K 4.3 4.2  CL 93* 95*  CO2 23 27  GLUCOSE 81 139*  BUN 70* 46*  CREATININE 9.61* 7.58*  CALCIUM 8.8 8.9  ALBUMIN 3.3* 3.1*   No results for input(s): PTH in the last 72 hours. Iron Studies: No results for input(s): IRON, TIBC, TRANSFERRIN, FERRITIN in the last 72 hours.  Studies/Results: No results found.   EXAM: General appearance: Alert, in no apparent distress Resp: CTA without rales, rhonchi, or wheezes Cardio: RRR without murmur or rub GI: + BS, soft and nontender Extremities: B BKA, no edema Access: R IJ catheter, AVF @ RFA with + bruit  HD: new start, getting HD MWF here, TTS as OP   Assessment/Plan: 1. New ESRD - started HD 4/8, outpatient HD @ [facility] on TTS pending. 2. Dialysis access - using R IJ catheter, AVF @ RFA placed by Dr. Kellie Simmering 4/8; mild to moderate steal symptoms evaluated by Dr. Donnetta Hutching 4/18, no indication to ligate fistula. 3. HTN/Volume - BP 127/56 on Amlodipine 10 qd, Doxazosin 1 mg qhs, Hydralazine 100 mg q8h; wt 87.9 kg, as low as 83 kg on 4/15, but tolerating poorly.  4. Anemia - Hgb 9.2, Aranesp 60 mcg on Fri, Fe sat 11%, ferritin 95, start Fe loading today. 5. Sec HPT - Ca 8.9 (9.6 corrected), P 6.7, iPTH 455; Hectorol 2 mcg, started  Phoslo. 6. Nutrition - Alb 3.3, renal diet, binder. 7. Debility - B BKA, in rehab.    LOS: 7 days   LYLES,CHARLES 05/02/2014,11:23 AM   Pt seen, examined and agree w A/P as above.  Kelly Splinter MD pager [phone]    cell [phone] 05/02/2014, 7:05 PM

## 2014-05-02 NOTE — Progress Notes (Signed)
[facility] PHYSICAL MEDICINE & REHABILITATION     PROGRESS NOTE    Subjective/Complaints: Had a good night. Excited to go home.. Complains of phantom pain, more so at night.   Objective: Vital Signs: Blood pressure 127/56, pulse 81, temperature 97.6 F (36.4 C), temperature source Oral, resp. rate 18, weight 87.9 kg (193 lb 12.6 oz), SpO2 99 %. No results found.  Recent Labs  04/29/14 1628 05/02/14 0659  WBC 9.5 7.7  HGB 9.3* 9.2*  HCT 29.1* 29.4*  PLT 274 306    Recent Labs  04/29/14 1628 05/02/14 0653  NA 131* 134*  K 4.3 4.2  CL 93* 95*  GLUCOSE 81 139*  BUN 70* 46*  CREATININE 9.61* 7.58*  CALCIUM 8.8 8.9   CBG (last 3)   Recent Labs  05/01/14 1619 05/01/14 2100 05/02/14 0642  GLUCAP 122* 240* 148*    Wt Readings from Last 3 Encounters:  05/01/14 87.9 kg (193 lb 12.6 oz)  04/24/14 83.9 kg (184 lb 15.5 oz)  04/08/14 91.717 kg (202 lb 3.2 oz)    Physical Exam:  General: No acute distress,  Mood and affect are appropriate Heart: Regular rate and rhythm no rubs murmurs Lungs: Clear to auscultation, breathing unlabored, no rales or wheezes Abdomen: Positive bowel sounds, soft nontender to palpation, nondistended Extremities: No clubbing, cyanosis, or edema Skin: No evidence of breakdown, no evidence of rash Neurologic: Cranial nerves II through XII intact, motor strength isSensory exam normal sensation to light touch and proprioception in bilateral upperextremities , Lower extremities not tested secondary to amputations,UE 4- deltoid, 4/5 biceps, tricep, HI LE: 3/5 HF 4/5 ke. Some pain along left knee   Cerebellar exam normal finger to nose to finger as well as heel to shin in bilateral upper and lower extremities Musculoskeletal: Full range of motion in all 4 extremities. No joint swelling, Bilateral below-knee stumps well-healed Without pain to palpation, No edema Psych: in good spirits. alert.  Assessment/Plan: 1. Functional deficits secondary  to debility due to renal failure and multiple medical which require 3+ hours per day of interdisciplinary therapy in a comprehensive inpatient rehab setting. Physiatrist is providing close team supervision and 24 hour management of active medical problems listed below. Physiatrist and rehab team continue to assess barriers to discharge/monitor patient progress toward functional and medical goals.      FIM: FIM - Bathing Bathing Steps Patient Completed: Front perineal area, Buttocks, Left Arm, Right Arm, Abdomen, Chest, Right upper leg, Left upper leg Bathing: 5: Supervision: Safety issues/verbal cues  FIM - Upper Body Dressing/Undressing Upper body dressing/undressing steps patient completed: Thread/unthread right sleeve of pullover shirt/dresss, Thread/unthread left sleeve of pullover shirt/dress, Put head through opening of pull over shirt/dress, Pull shirt over trunk Upper body dressing/undressing: 5: Set-up assist to: Obtain clothing/put away FIM - Lower Body Dressing/Undressing Lower body dressing/undressing steps patient completed: Thread/unthread right pants leg, Thread/unthread left pants leg, Thread/unthread right underwear leg, Thread/unthread left underwear leg, Pull pants up/down Lower body dressing/undressing: 4: Min-Patient completed 75 plus % of tasks  FIM - Toileting Toileting steps completed by patient: Adjust clothing prior to toileting Toileting Assistive Devices: Grab bar or rail for support Toileting: 1: Two helpers  FIM - Radio producer Devices: Product manager Transfers: 4-To toilet/BSC: Min A (steadying Pt. > 75%), 4-From toilet/BSC: Min A (steadying Pt. > 75%)  FIM - Control and instrumentation engineer Devices: Orthosis Bed/Chair Transfer: 6: Supine > Sit: No assist, 4: Bed > Chair or W/C:  Min A (steadying Pt. > 75%)  FIM - Locomotion: Wheelchair Distance: 150 Locomotion: Wheelchair: 5: Travels 150 ft or more:  maneuvers on rugs and over door sills with supervision, cueing or coaxing FIM - Locomotion: Ambulation Locomotion: Ambulation Assistive Devices: Administrator, Prosthesis Ambulation/Gait Assistance: 4: Min assist Locomotion: Ambulation: 1: Travels less than 50 ft with minimal assistance (Pt.>75%)  Comprehension Comprehension Mode: Auditory Comprehension: 7-Follows complex conversation/direction: With no assist  Expression Expression Mode: Verbal Expression: 7-Expresses complex ideas: With no assist  Social Interaction Social Interaction: 7-Interacts appropriately with others - No medications needed.  Problem Solving Problem Solving: 5-Solves basic problems: With no assist  Memory Memory: 6-More than reasonable amt of time   Medical Problem List and Plan: 1. Functional deficits secondary to Debility due to renal failure new HD in a patient with recent BKA's 2. DVT Prophylaxis/Anticoagulation: Pharmaceutical: Lovenox 3. Neuropathy/Pain Management: Continue lyrica daily (max renal dose). Added LOW dose tramadol for breakthrough phantom limb pain 4. Mood: Team to provide ego support. LCSW to follow for evaluation and support.  5. Neuropsych: This patient is capable of making decisions on his own behalf. 6. Skin/Wound Care: Routine pressure relief measures. Monitor graft site for healing.  7. Fluids/Electrolytes/Nutrition: 1200 cc/FR daily to avoid overload.  Dietary ed.  8. DM type 2: Continue Levemir.   SSI for elevated BS. Will monitor BS with achs checks.   -added am dose of levemir--increased to 15u --> follow for pattern  9. HTN: monitor BP every 8 hours. Continue Norvasc daily, cardura daily and apresoline tid.  10. Anemia of chronic disease: On aranesp daily 11. ESRD: To continue HD on TTS.  dialysis sessions occur at end of day to allow therapy and manage post procedure fatigue LOS (Days) 7 A FACE TO FACE EVALUATION WAS PERFORMED  Aaron Long T 05/02/2014  8:33 AM

## 2014-05-02 NOTE — Progress Notes (Signed)
Physical Therapy Session Note  Patient Details  Name: Aaron Long MRN: [id number] Date of Birth: February 05, 1940  Today's Date: 05/02/2014 PT Individual Time: 0800-0930 PT Individual Time Calculation (min): 90 min   Short Term Goals: Week 1:  PT Short Term Goal 1 (Week 1): STG=LTG due to LOS, S overall for bed mobility and transfers, Gait short distance with RW Min A  Skilled Therapeutic Interventions/Progress Updates:    Pt donned bilateral prosthesis EOB independently and performed S scoot transfer to w/c. Hygiene at sink from w/c level prior to leaving room. Self propelled w/c to and from therapy at mod I level. Gait training with bilateral prosthesis and RW x 50' x 2 with min A for sit to stand and steady A for gait with cues for upright posture. Core strengthening exercises to aid with overall mobility and transfers using weighted medicine ball (red) including PNF diagonals (x 20 reps, x 15 reps, x 15 reps each direction) and trunk rotation each direction x 15 reps x 3 sets each direction. Mini squats in standing with RW for support with cues for technique x 10 reps to strengthen muscles needed for sit to stands. Supine bridging x 10 reps for functional strengthening and performed bed mobility at mod I level from flat surface. Handout given to pt for HEP for BLE strengthening exercises without prostheses to continues to address strength and ROM. Stair negotiation with bilateral rails up/down 3 steps with min A for community mobility training. Co-treatment with TR to address community w/c mobility including going over compliant surface to simulate uneven surfaces with S, up/down ramp for community access with min A with cues for efficiency and safety, and obstacle negotiation with S. Returned back to room and re-discussed resources available for support groups with TR who will help coordination information. All needs in reach.   Therapy Documentation Precautions:  Precautions Precautions:  Fall Precaution Comments: bil BKA, new AV fitula RUE Required Braces or Orthoses: Other Brace/Splint Other Brace/Splint: bil proestheses Restrictions Weight Bearing Restrictions: Yes Other Position/Activity Restrictions: New AV fistula in R UE Pain:  Denies pain.  See FIM for current functional status  Therapy/Group: Individual Therapy & Co-treatment for last 30 min with TR  Juanna Cao, PT, DPT  05/02/2014, 9:56 AM

## 2014-05-03 ENCOUNTER — Inpatient Hospital Stay (HOSPITAL_COMMUNITY): Payer: Medicare Other | Admitting: Occupational Therapy

## 2014-05-03 ENCOUNTER — Inpatient Hospital Stay (HOSPITAL_COMMUNITY): Payer: Medicare Other

## 2014-05-03 LAB — GLUCOSE, CAPILLARY
GLUCOSE-CAPILLARY: 138 mg/dL — AB (ref 70–99)
Glucose-Capillary: 108 mg/dL — ABNORMAL HIGH (ref 70–99)
Glucose-Capillary: 143 mg/dL — ABNORMAL HIGH (ref 70–99)
Glucose-Capillary: 94 mg/dL (ref 70–99)

## 2014-05-03 MED ORDER — FAMOTIDINE 20 MG PO TABS: 20.0000 mg | ORAL_TABLET | Freq: Every day | ORAL | Status: DC

## 2014-05-03 MED ORDER — HYDRALAZINE HCL 25 MG PO TABS
25.0000 mg | ORAL_TABLET | Freq: Two times a day (BID) | ORAL | Status: DC
  Administered 2014-05-03 – 2014-05-04 (×2): 25 mg via ORAL
  Filled 2014-05-03 (×4): qty 1

## 2014-05-03 MED ORDER — AMLODIPINE BESYLATE 10 MG PO TABS: 10.0000 mg | ORAL_TABLET | Freq: Every day | ORAL | Status: DC

## 2014-05-03 MED ORDER — AMLODIPINE BESYLATE 10 MG PO TABS
10.0000 mg | ORAL_TABLET | Freq: Every day | ORAL | Status: DC
Start: 2014-05-04 — End: 2014-05-04
  Filled 2014-05-03: qty 1

## 2014-05-03 MED ORDER — HYDRALAZINE HCL 25 MG PO TABS: 25.0000 mg | ORAL_TABLET | Freq: Two times a day (BID) | ORAL | Status: DC

## 2014-05-03 MED ORDER — HYDRALAZINE HCL 50 MG PO TABS: 50.0000 mg | ORAL_TABLET | Freq: Two times a day (BID) | ORAL | Status: DC

## 2014-05-03 MED ORDER — FAMOTIDINE 20 MG PO TABS
20.0000 mg | ORAL_TABLET | Freq: Every day | ORAL | Status: DC
  Administered 2014-05-03: 20 mg via ORAL
  Filled 2014-05-03 (×2): qty 1

## 2014-05-03 MED ORDER — RENA-VITE PO TABS: 1.0000 | ORAL_TABLET | Freq: Every day | ORAL | Status: DC

## 2014-05-03 MED ORDER — INSULIN DETEMIR 100 UNIT/ML ~~LOC~~ SOLN: 15.0000 [IU] | Freq: Two times a day (BID) | SUBCUTANEOUS | Status: DC

## 2014-05-03 MED ORDER — CALCIUM ACETATE (PHOS BINDER) 667 MG PO CAPS: 667.0000 mg | ORAL_CAPSULE | Freq: Three times a day (TID) | ORAL | Status: DC

## 2014-05-03 MED ORDER — PREGABALIN 75 MG PO CAPS: 75.0000 mg | ORAL_CAPSULE | Freq: Every day | ORAL | Status: DC

## 2014-05-03 MED ORDER — INSULIN DETEMIR 100 UNIT/ML FLEXPEN: 15.0000 [IU] | PEN_INJECTOR | Freq: Two times a day (BID) | SUBCUTANEOUS | Status: DC

## 2014-05-03 MED ORDER — QUETIAPINE FUMARATE 25 MG PO TABS: 25.0000 mg | ORAL_TABLET | Freq: Every day | ORAL | Status: DC

## 2014-05-03 MED ORDER — TRAMADOL HCL 50 MG PO TABS: 25.0000 mg | ORAL_TABLET | Freq: Two times a day (BID) | ORAL | Status: DC | PRN

## 2014-05-03 NOTE — Progress Notes (Signed)
Physical Therapy Session Note  Patient Details  Name: Aaron Long MRN: [id number] Date of Birth: 03-08-1940  Today's Date: 05/03/2014 PT Individual Time: 0800-0900 PT Individual Time Calculation (min): 60 min   Short Term Goals: Week 1:  PT Short Term Goal 1 (Week 1): STG=LTG due to LOS, S overall for bed mobility and transfers, Gait short distance with RW Min A  Skilled Therapeutic Interventions/Progress Updates:   Pt eating breakfast EOB upon therapist entering the room. Pt donned bilteral prostheses independently with extra time. Transferred OOB with S for scoot pivot (mod I for transfer but S to stabilize w/c). Pt reporting he feels more fatigued the day after HD and required extra time for all tasks this AM.  Pt propelled w/c on unit mod I for functional mobility and overall strengthening and endurance. Gait with RW with min A x 35' limited due to fatigue this AM. Practiced simulated car transfer with slideboard with overall S (due to therapist needing to stabilize w/c from sliding) for preparation for home entry. Transfers to regular bed in ADL apartment with overall S for scoot pivot (mod I for transfer but S for therapist to stabilize w/c due to brake mechanism). Discussed furniture transfers, but pt reports he often doesn't get onto his couch and would prefer not to practice at this time.   Therapy Documentation Precautions:  Precautions Precautions: Fall Precaution Comments: bil BKA, new AV fitula RUE Required Braces or Orthoses: Other Brace/Splint Other Brace/Splint: bilateral prostheses Restrictions Weight Bearing Restrictions: Yes Other Position/Activity Restrictions: New AV fistula in R UE  See FIM for current functional status  Therapy/Group: Individual Therapy  Canary Brim Ivory Broad, PT, DPT  05/03/2014, 10:11 AM

## 2014-05-03 NOTE — Progress Notes (Signed)
[facility] PHYSICAL MEDICINE & REHABILITATION     PROGRESS NOTE    Subjective/Complaints: Slept well. Denies pain. Feels that he's ready to go home tomorrow.    Objective: Vital Signs: Blood pressure 163/70, pulse 81, temperature 98.5 F (36.9 C), temperature source Oral, resp. rate 17, weight 86.3 kg (190 lb 4.1 oz), SpO2 96 %. No results found.  Recent Labs  05/02/14 0659  WBC 7.7  HGB 9.2*  HCT 29.4*  PLT 306    Recent Labs  05/02/14 0653  NA 134*  K 4.2  CL 95*  GLUCOSE 139*  BUN 46*  CREATININE 7.58*  CALCIUM 8.9   CBG (last 3)   Recent Labs  05/02/14 1618 05/02/14 2231 05/03/14 0702  GLUCAP 85 131* 108*    Wt Readings from Last 3 Encounters:  05/03/14 86.3 kg (190 lb 4.1 oz)  04/24/14 83.9 kg (184 lb 15.5 oz)  04/08/14 91.717 kg (202 lb 3.2 oz)    Physical Exam:  General: No acute distress,  Mood and affect are appropriate Heart: Regular rate and rhythm no rubs murmurs Lungs: Clear to auscultation, breathing unlabored, no rales or wheezes Abdomen: Positive bowel sounds, soft nontender to palpation, nondistended Extremities: No clubbing, cyanosis, or edema Skin: No evidence of breakdown, no evidence of rash Neurologic: Cranial nerves II through XII intact, motor strength isSensory exam normal sensation to light touch and proprioception in bilateral upperextremities , Lower extremities not tested secondary to amputations,UE 4- deltoid, 4/5 biceps, tricep, HI LE: 3/5 HF 4/5 ke. Mild pain along left knee   Cerebellar exam normal finger to nose to finger as well as heel to shin in bilateral upper and lower extremities Musculoskeletal: Full range of motion in all 4 extremities. No joint swelling, Bilateral below-knee stumps well-healed Without pain to palpation, No edema Psych: in good spirits. alert.  Assessment/Plan: 1. Functional deficits secondary to debility due to renal failure and multiple medical which require 3+ hours per day of  interdisciplinary therapy in a comprehensive inpatient rehab setting. Physiatrist is providing close team supervision and 24 hour management of active medical problems listed below. Physiatrist and rehab team continue to assess barriers to discharge/monitor patient progress toward functional and medical goals.  Home tomorrow after MD rounds.    FIM: FIM - Bathing Bathing Steps Patient Completed: Front perineal area, Buttocks, Left Arm, Right Arm, Abdomen, Chest, Right upper leg, Left upper leg Bathing: 6: More than reasonable amount of time  FIM - Upper Body Dressing/Undressing Upper body dressing/undressing steps patient completed: Thread/unthread right sleeve of pullover shirt/dresss, Thread/unthread left sleeve of pullover shirt/dress, Put head through opening of pull over shirt/dress, Pull shirt over trunk Upper body dressing/undressing: 5: Set-up assist to: Obtain clothing/put away FIM - Lower Body Dressing/Undressing Lower body dressing/undressing steps patient completed: Thread/unthread right pants leg, Thread/unthread left pants leg, Thread/unthread right underwear leg, Thread/unthread left underwear leg, Pull pants up/down, Pull underwear up/down Lower body dressing/undressing: 4: Steadying Assist  FIM - Toileting Toileting steps completed by patient: Adjust clothing prior to toileting Toileting Assistive Devices: Grab bar or rail for support Toileting: 1: Two helpers  FIM - Radio producer Devices: Product manager Transfers: 4-To toilet/BSC: Min A (steadying Pt. > 75%), 4-From toilet/BSC: Min A (steadying Pt. > 75%)  FIM - Bed/Chair Transfer Bed/Chair Transfer Assistive Devices: Prosthesis, Adult nurse Transfer: 5: Chair or W/C > Bed: Supervision (verbal cues/safety issues), 6: Sit > Supine: No assist  FIM - Locomotion: Wheelchair Distance: 150 Locomotion: Wheelchair:  6: Travels 150 ft or more, turns around, maneuvers to table, bed or  toilet, negotiates 3% grade: maneuvers on rugs and over door sills independently FIM - Locomotion: Ambulation Locomotion: Ambulation Assistive Devices: Administrator, Prosthesis Ambulation/Gait Assistance: 4: Min guard Locomotion: Ambulation: 2: Travels 50 - 149 ft with minimal assistance (Pt.>75%)  Comprehension Comprehension Mode: Auditory Comprehension: 7-Follows complex conversation/direction: With no assist  Expression Expression Mode: Verbal Expression: 7-Expresses complex ideas: With no assist  Social Interaction Social Interaction: 7-Interacts appropriately with others - No medications needed.  Problem Solving Problem Solving: 5-Solves basic problems: With no assist  Memory Memory: 6-More than reasonable amt of time   Medical Problem List and Plan: 1. Functional deficits secondary to Debility due to renal failure new HD in a patient with recent BKA's 2. DVT Prophylaxis/Anticoagulation: Pharmaceutical: Lovenox 3. Neuropathy/Pain Management: Continue lyrica daily (max renal dose). Added LOW dose tramadol for breakthrough phantom limb pain 4. Mood: Team to provide ego support. LCSW to follow for evaluation and support.  5. Neuropsych: This patient is capable of making decisions on his own behalf. 6. Skin/Wound Care: Routine pressure relief measures. Monitor graft site for healing.  7. Fluids/Electrolytes/Nutrition: 1200 cc/FR daily to avoid overload.  Dietary ed.  8. DM type 2: Continue Levemir.   SSI for elevated BS. Will monitor BS with achs checks.   -added am dose of levemir which is at 15u -->now on 15u bid---continue this regimen at home 9. HTN: monitor BP every 8 hours. Continue Norvasc daily, cardura daily and apresoline tid.  10. Anemia of chronic disease: On aranesp daily 11. ESRD: To continue HD on TTS.  dialysis sessions occur at end of day to allow therapy and manage post procedure fatigue LOS (Days) 8 A FACE TO FACE EVALUATION WAS  PERFORMED  Della Scrivener T 05/03/2014 7:53 AM

## 2014-05-03 NOTE — Discharge Instructions (Signed)
Inpatient Rehab Discharge Instructions  Aaron Long Discharge date and time:  06/03/14  Activities/Precautions/ Functional Status: Activity: activity as tolerated Diet: renal diet 1200 cc (5 cups) Fluid limitation daily Wound Care: keep wound clean and dry   Functional status:  ___ No restrictions     ___ Walk up steps independently ___ 24/7 supervision/assistance   ___ Walk up steps with assistance _X__ Intermittent supervision/assistance  _X__ Bathe/dress independently ___ Walk with walker     ___ Bathe/dress with assistance ___ Walk Independently    ___ Shower independently ___ Walk with assistance    ___ Shower with assistance _X__ No alcohol     ___ Return to work/school ________     COMMUNITY REFERRALS UPON DISCHARGE:    Outpatient: PT                    Agency: [facility] (with Jamey Reas, PT)   Phone: [phone]               Appointment Date/Time: 05/27/14 @ 2:45 pm  Medical Equipment/Items Ordered: change to 18x18  Wheelchair with full-length arms, cushion                                                     Agency/Supplier: [facility] @ [phone]   GENERAL COMMUNITY RESOURCES FOR PATIENT/FAMILY:  Support Groups: Amputee Support group      Special Instructions: 1. Check blood sugars 3-4 times a day.    My questions have been answered and I understand these instructions. I will adhere to these goals and the provided educational materials after my discharge from the hospital.  Patient/Caregiver Signature _______________________________ Date __________  Clinician Signature _______________________________________ Date __________  Please bring this form and your medication list with you to all your follow-up doctor's appointments.

## 2014-05-03 NOTE — Progress Notes (Signed)
Physical Therapy Discharge Summary  Patient Details  Name: Aaron Long MRN: [id number] Date of Birth: 1940-04-02  Patient has met 7 of 7 long term goals due to improved activity tolerance, improved balance, improved postural control and ability to compensate for deficits.  Patient to discharge at a wheelchair level Supervision to mod I.   Patient's care partner is independent to provide the necessary supervision assistance at discharge.  Reasons goals not met: n/a - all goals met art this time  Recommendation:  Patient will benefit from ongoing skilled PT services in outpatient setting to continue to advance safe functional mobility, address ongoing impairments in strength, balance, functional gait, endurance, ROM, functional mobility, and minimize fall risk.  Equipment: Switched w/c for 18x18 with basic cushion. Pt already owns RW and slideboard  Reasons for discharge: treatment goals met and discharge from hospital  Patient/family agrees with progress made and goals achieved: Yes  PT Discharge Precautions/Restrictions Precautions Precautions: Fall Precaution Comments: bil BKA, new AV fitula RUE Required Braces or Orthoses: Other Brace/Splint Other Brace/Splint: bilateral prostheses Restrictions Other Position/Activity Restrictions: New AV fistula in R UE Vision/Perception  Vision - History Baseline Vision: Wears glasses all the time  Cognition Overall Cognitive Status: Within Functional Limits for tasks assessed Safety/Judgment: Appears intact Sensation Sensation Light Touch: Impaired Detail Light Touch Impaired Details: Impaired RUE (since placement of AV fistula) Coordination Gross Motor Movements are Fluid and Coordinated: Yes Motor  Motor Motor: Within Functional Limits  Locomotion  Ambulation Ambulation/Gait Assistance: 4: Min guard  Trunk/Postural Assessment  Cervical Assessment Cervical Assessment: Within Functional Limits Thoracic Assessment Thoracic  Assessment: Within Functional Limits Lumbar Assessment Lumbar Assessment: Within Functional Limits  Balance Balance Balance Assessed: Yes Static Sitting Balance Static Sitting - Level of Assistance: 6: Modified independent (Device/Increase time) Dynamic Sitting Balance Dynamic Sitting - Level of Assistance: 6: Modified independent (Device/Increase time) Static Standing Balance Static Standing - Level of Assistance: 5: Stand by assistance;4: Min assist Dynamic Standing Balance Dynamic Standing - Level of Assistance: 4: Min assist Extremity Assessment      RLE Assessment RLE Assessment: Exceptions to [facility] RLE PROM (degrees) Right Knee Extension: -10 (same as admission) RLE Strength RLE Overall Strength Comments: WFL 4/5 LLE Assessment LLE Assessment: Exceptions to WFL LLE PROM (degrees) Left Knee Extension: -12 (same as admission) LLE Strength LLE Overall Strength Comments: WFL 4/5  See FIM for current functional status  Canary Brim Ivory Broad, PT, DPT  05/03/2014, 3:23 PM

## 2014-05-03 NOTE — Progress Notes (Signed)
Occupational Therapy Session Note  Patient Details  Name: Aaron Long MRN: [id number] Date of Birth: [birth year]  Today's Date: 05/03/2014 OT Individual Time: 1000-1100 OT Individual Time Calculation (min): 60 min    Short Term Goals: Week 1:  OT Short Term Goal 1 (Week 1): STG=LTG  Skilled Therapeutic Interventions/Progress Updates:  Upon entering the room, pt seated in wheelchair with no c/o pain. Pt did report increased fatigue from previous therapy session and from dialysis treatment the day before. Pt agreeable to OT session and requests to wash at sink side this session. Pt performed UB bathing and dressing at sink side with Mod I before requesting to toilet. Pt propelled wheelchair into bathroom, set up wheelchair independently, and performed squat pivot transfer with min A onto toilet from wheelchair. Pt able to have BM during session and required assist for thoroughness of hygiene. While seated on commode, pt removed B prosthesis and donned underwear and elastic waist shorts. Pt engaging in lateral leans to pull clothing over B hips. Pt then donning B prosthesis before returning to wheelchair with min A squat pivot. OT educated pt on energy conservation techniques for self care tasks and ain the community with pt verbalizing understanding. Pt seated in wheelchair with call bell and all needed items within reach.   Therapy Documentation Precautions:  Precautions Precautions: Fall Precaution Comments: bil BKA, new AV fitula RUE Required Braces or Orthoses: Other Brace/Splint Other Brace/Splint: bilateral prostheses Restrictions Weight Bearing Restrictions: Yes Other Position/Activity Restrictions: New AV fistula in R UE Vital Signs: Therapy Vitals BP: (!) 118/52 mmHg  See FIM for current functional status  Therapy/Group: Individual Therapy  Aaron Long 05/03/2014, 12:40 PM

## 2014-05-03 NOTE — Discharge Summary (Signed)
Physician Discharge Summary  Patient ID: Aaron Long MRN: [id number] DOB/AGE: June 28, 1940 74 y.o.  Admit date: 04/25/2014 Discharge date: 05/03/2014  Discharge Diagnoses:  Principal Problem:   Physical deconditioning Active Problems:   CKD (chronic kidney disease) stage 4, GFR 15-29 ml/min   DM type 2, uncontrolled, with neuropathy   Status post bilateral below knee amputation   Anemia of chronic disease   Insomnia due to stress   Discharged Condition: Stable    Labs:  Basic Metabolic Panel:  Recent Labs Lab 04/26/14 1734 04/29/14 1628 05/02/14 0653  NA 136 131* 134*  K 4.0 4.3 4.2  CL 97 93* 95*  CO2 25 23 27   GLUCOSE 177* 81 139*  BUN 52* 70* 46*  CREATININE 7.96* 9.61* 7.58*  CALCIUM 8.9 8.8 8.9  PHOS 6.7* 7.9* 6.7*    CBC:  Recent Labs Lab 04/26/14 1734 04/29/14 1628 05/02/14 0659  WBC 9.2 9.5 7.7  HGB 9.0* 9.3* 9.2*  HCT 28.7* 29.1* 29.4*  MCV 91.1 89.8 91.3  PLT 233 274 306    CBG:  Recent Labs Lab 05/02/14 0642 05/02/14 1121 05/02/14 1618 05/02/14 2231 05/03/14 0702  GLUCAP 148* 197* 85 131* 108*    Brief HPI:   Aaron Long is a 74 y.o. male with history of HTN, CAD, CKD, B-BKA, chronic pain; who has been reluctant to get HD in the past. He was admitted on 04/17/14 with syncope, tremors and recurrent hyperkalemia. Patient with Scr 7.1 and K-6.5. CT head negative for acute changes. He was treated with Kayexalate, insulin and lasix and was agreeable to start to HD. Dialysis catheter and AV fistula created on 04/08 by Dr. Kellie Simmering and HD initiated. Patient has had improvement in energy levels and po intake and nighttime confusion is resolving. Therapy ongoing and patient limited by RUE pain with limited use as well as deconditioning. PTA- he was undergoing outpatient therapy for prosthetic training and ambulating ~ 16' with min/guard assist PTA. CIR recommended by MD   Hospital Course: [name] was admitted to rehab 04/25/2014  for inpatient therapies to consist of PT and OT at least three hours five days a week. Past admission physiatrist, therapy team and rehab RN have worked together to provide customized collaborative inpatient rehab. Pain and edema RUE has steadily improved.  Blood pressures have been reasonably controlled. Diabetes was monitored on ac/hs basis and blood sugars were noted to be poorly controlled. Am dose of levemir was added for tighter control. Patient has been educated on renal diet as well as need for fluid restrictions. HD has been ongoing on TTS past therapy sessions and patient is showing acceptance of this.  Mood has stabilized and he had made good progress. Dr. Pollard/neuropsychologist has discussed coping strategies as well as symptoms to monitor regarding development of depression. Patient has made steady progress and is at modified independent to min assist at wheelchair level.  He will continue to receive follow up outpatient PT at [facility] beginning 05/27/14 at 2:45pm    Rehab course: During patient's stay in rehab weekly team conferences were held to monitor patient's progress, set goals and discuss barriers to discharge. At admission, patient required moderate assist with ADL tasks and max assist with mobility. He has had improvement in activity tolerance, balance, postural control, as well as ability to compensate for deficits. He is has had improvement in functional use RUE and is able to complete ADL tasks at supervision to min assist level. He is modified  independent for scoot pivot transfers and is able to ambulate 45' with RW and min/guard assist.     Disposition: Home   Diet: Renal diet.  1200 cc fluid/day  Special Instructions: 1. Check blood sugars 3-4 times a day.     Medication List    STOP taking these medications        Darbepoetin Alfa 60 MCG/0.3ML Sosy injection  Commonly known as:  ARANESP     doxercalciferol 4 MCG/2ML injection  Commonly known as:   HECTOROL     HYDROcodone-acetaminophen 5-325 MG per tablet  Commonly known as:  NORCO/VICODIN     metoprolol succinate 25 MG 24 hr tablet  Commonly known as:  TOPROL-XL     polyethylene glycol packet  Commonly known as:  MIRALAX / GLYCOLAX      TAKE these medications        AMBULATORY NON FORMULARY MEDICATION  - BD U/P Mini Pen [place] 31GX5MM  - Use three times daily as directed  - DX:250.00     AMBULATORY NON FORMULARY MEDICATION  - One Touch Ultra 2 Test Strips  - Sig:  Check blood sugars Three times daily to keep blood sugars under control  - Dx: 250.00, 401.9     amLODipine 10 MG tablet  Commonly known as:  NORVASC  take 1 tablet by mouth once daily     aspirin EC 81 MG tablet  Take 1 tablet (81 mg total) by mouth every morning.     calcium acetate 667 MG capsule  Commonly known as:  PHOSLO  Take 1 capsule (667 mg total) by mouth 3 (three) times daily with meals.     clopidogrel 75 MG tablet  Commonly known as:  PLAVIX  take 1 tablet by mouth once daily     doxazosin 1 MG tablet  Commonly known as:  CARDURA  take 1 tablet by mouth at bedtime     feeding supplement (NEPRO CARB STEADY) Liqd  Take 237 mLs by mouth daily at 3 pm.     hydrALAZINE 100 MG tablet  Commonly known as:  APRESOLINE  Take 1 tablet (100 mg total) by mouth every 8 (eight) hours.     insulin aspart 100 UNIT/ML FlexPen  Commonly known as:  NOVOLOG  Inject 5 Units into the skin 3 (three) times daily with meals. Inject 5 units three times a day with meals if blood sugar greater than 150 only, DX: 250.71     insulin detemir 100 UNIT/ML injection  Commonly known as:  LEVEMIR  Inject 0.15 mLs (15 Units total) into the skin 2 (two) times daily.     Insulin Detemir 100 UNIT/ML Pen  Commonly known as:  LEVEMIR  Inject 15 Units into the skin 2 (two) times daily.     multivitamin Tabs tablet  Take 1 tablet by mouth at bedtime.     pantoprazole 20 MG tablet  Commonly known as:  PROTONIX   Take 1 tablet (20 mg total) by mouth daily.     pravastatin 20 MG tablet  Commonly known as:  PRAVACHOL  Take 1 tablet (20 mg total) by mouth daily.     pregabalin 75 MG capsule  Commonly known as:  LYRICA  Take 1 capsule (75 mg total) by mouth daily.     QUEtiapine 25 MG tablet  Commonly known as:  SEROQUEL  Take 1 tablet (25 mg total) by mouth daily.     senna-docusate 8.6-50 MG per tablet  Commonly known as:  Senokot-S  Take 2 tablets by mouth 2 (two) times daily.     traMADol 50 MG tablet--Rx # 30 pills   Commonly known as:  ULTRAM  Take 0.5 tablets (25 mg total) by mouth every 12 (twelve) hours as needed for severe pain.     Wheelchair Misc  Patient needs new Wheelchair due to his being broken.           Follow-up Information    Call Meredith Staggers, MD.   Specialty:  Physical Medicine and Rehabilitation   Why:  As needed   Contact information:   [address] [place] [address] [phone]       Follow up with [name], MD. Call on 05/28/2014.   Specialty:  Vascular Surgery   Why:  Be there at  8:30 am for follow up appointment   Contact information:   [address] [place]  [address] [phone]       Signed: Bary Leriche 05/03/2014, 10:12 AM

## 2014-05-03 NOTE — Progress Notes (Signed)
Occupational Therapy Session Note  Patient Details  Name: Aaron Long MRN: [id number] Date of Birth: 20-Nov-1940  Today's Date: 05/03/2014 OT Individual Time: 1330-1430 OT Individual Time Calculation (min): 60 min    Short Term Goals: Week 1:  OT Short Term Goal 1 (Week 1): STG=LTG  Skilled Therapeutic Interventions/Progress Updates:    Pt seen for OT session focusing on functional mobility; functional standing balance, and functional transfers. Pt in therapy gym upon arrival, having just finished with PT, and agreeable to tx. Pt taken to ADL kitchen in w/c level mod I. He stood from w/c with supervision, and stood to organize overhead cabinet while stabilizing with one UE on counter with supervision, demonstrating functional standing balance t reach overhead and move objects without LOB. Pt then completed functional transfers in bathroom including on/off tub transfer bench and BSC with supervision and cues for safety.Pt requires assist for stabilization of RW when coming from sit>stand. All transfers completed with prothesis donned as pt voiced that is how he plans to complete transfers upon d/c. Pt then taken to therapy gym where he complete 3x sit<> stan from various level surfaces in order to increase independence during functional transfers. At end of session, pt ambulated ~15 yards in the hallway before requesting seated rest break. Pt taken back to room total A in w/c for time. In room, pt transferred w/c> bed with prothesis donned mod I, and sat EOB to doff prosthesis and sleeves. He completed all bed mobility mod I. Pt left in supine at end of session, all needs in reach.    Pt educated regarding need for assist/supervision upon d/c, energy conservation, DME, and d/c planning.   Therapy Documentation Precautions:  Precautions Precautions: Fall Precaution Comments: bil BKA, new AV fitula RUE Required Braces or Orthoses: Other Brace/Splint Other Brace/Splint: bilateral  prostheses Restrictions Weight Bearing Restrictions: Yes Other Position/Activity Restrictions: New AV fistula in R UE Pain: Pain Assessment Pain Assessment: No/denies pain  See FIM for current functional status  Therapy/Group: Individual Therapy  Lewis, Zenaya Ulatowski C 05/03/2014, 4:55 PM

## 2014-05-03 NOTE — Plan of Care (Signed)
Problem: RH Furniture Transfers Goal: LTG Patient will perform furniture transfers w/assist (OT/PT LTG: Patient will perform furniture transfers with assistance (OT/PT).  Outcome: Not Applicable Date Met:  83/87/06 Pt does not transfer to furniture in the home. Prefers to stay in his w/c.

## 2014-05-03 NOTE — Progress Notes (Signed)
Physical Therapy Session Note  Patient Details  Name: Aaron Long MRN: [id number] Date of Birth: May 24, 1940  Today's Date: 05/03/2014 PT Individual Time: 1300-1330 PT Individual Time Calculation (min): 30 min   Short Term Goals: Week 1:  PT Short Term Goal 1 (Week 1): STG=LTG due to LOS, S overall for bed mobility and transfers, Gait short distance with RW Min A  Skilled Therapeutic Interventions/Progress Updates:    Pt reports feeling better since this morning from a fatigue standpoint. Pt donned bilateral prostheses independently seated EOB. New w/c had been delivered to room and pt able to perform mod I transfer OOB scoot pivot technique. Gait training with RW x 55' with min guard A and cues for hip extension and upright posture. Stair negotiation up/down 4 steps (6") with rails with min to mod A needed for community mobility training. Pt denies any further concerns in regards to d/c planned for tomorrow.   Therapy Documentation Precautions:  Precautions Precautions: Fall Precaution Comments: bil BKA, new AV fitula RUE Required Braces or Orthoses: Other Brace/Splint Other Brace/Splint: bilateral prostheses Restrictions Weight Bearing Restrictions: Yes Other Position/Activity Restrictions: New AV fistula in R UE  Pain:  Denies pain.  See FIM for current functional status  Therapy/Group: Individual Therapy  Canary Brim Ivory Broad, PT, DPT  05/03/2014, 3:19 PM

## 2014-05-03 NOTE — Progress Notes (Signed)
Subjective: Interval History: has no complaint , other than wants to go home.  Objective: Vital signs in last 24 hours: Temp:  [98 F (36.7 C)-99.4 F (37.4 C)] 98.5 F (36.9 C) (04/22 0619) Pulse Rate:  [74-87] 81 (04/22 0619) Resp:  [12-18] 17 (04/22 0619) BP: (101-163)/(47-88) 118/52 mmHg (04/22 0905) SpO2:  [94 %-98 %] 96 % (04/22 0619) Weight:  [86.3 kg (190 lb 4.1 oz)-89 kg (196 lb 3.4 oz)] 86.3 kg (190 lb 4.1 oz) (04/22 4496) Weight change:   Intake/Output from previous day: 04/21 0701 - 04/22 0700 In: 600 [P.O.:600] Out: 2000  Intake/Output this shift: Total I/O In: 600 [P.O.:600] Out: -   General appearance: alert, cooperative and edematous Resp: diminished breath sounds bilaterally Chest wall: IJ Cath Cardio: S1, S2 normal and systolic murmur: holosystolic 2/6, blowing at apex GI: obese, pos bs, liver down 5 cm Extremities: bilat LE amp, AVF RLA  B&T  Lab Results:  Recent Labs  05/02/14 0659  WBC 7.7  HGB 9.2*  HCT 29.4*  PLT 306   BMET:  Recent Labs  05/02/14 0653  NA 134*  K 4.2  CL 95*  CO2 27  GLUCOSE 139*  BUN 46*  CREATININE 7.58*  CALCIUM 8.9   No results for input(s): PTH in the last 72 hours. Iron Studies: No results for input(s): IRON, TIBC, TRANSFERRIN, FERRITIN in the last 72 hours.  Studies/Results: No results found.  I have reviewed the patient's current medications.  Assessment/Plan: 1 ESRD for HD in am.  Lower vol 2 HTN lower meds as bp lower 3 Obesity 4 Anemia epo /Fe 5 HPTH 6 PVD s/p am per rehab 7 DM controlled P HD, epo, lower meds and vol    LOS: 8 days   Aaron Long 05/03/2014,1:32 PM

## 2014-05-03 NOTE — Progress Notes (Signed)
Occupational Therapy Discharge Summary  Patient Details  Name: Aaron Long MRN: [id number] Date of Birth: 05/20/1940   Patient has met 7 of 7 long term goals due to improved activity tolerance, improved balance, postural control, ability to compensate for deficits and functional use of  RIGHT upper extremity.  Patient to discharge at overall Supervision- min A from w/c level.  Patient's care partner is independent to provide the necessary physical assistance at discharge.    Reasons goals not met: N/A all OT goals met  Recommendation:  Patient with no further OT needs at this time. HEP provided to pt including theraband and theraputty to increase UE strength and fine motor dexterity. Pt educated regarding role of outpatient OT if he believes the need arises.   Equipment: Pt with all needed DME upon admission  Reasons for discharge: treatment goals met and discharge from hospital  Patient/family agrees with progress made and goals achieved: Yes  OT Discharge Precautions/Restrictions  Precautions Precautions: Fall Precaution Comments: bil BKA, new AV fitula RUE Required Braces or Orthoses: Other Brace/Splint Other Brace/Splint: bilateral prostheses Restrictions Weight Bearing Restrictions: Yes Other Position/Activity Restrictions: New AV fistula in R UE Pain Pain Assessment Pain Assessment: No/denies pain Vision/Perception  Vision- History Baseline Vision/History: Wears glasses Wears Glasses: Reading only Patient Visual Report: Blurring of vision Vision- Assessment Additional Comments: Pt cont to voice decreased visual acuity following first dialysis tx, however, vision does not impede functional performance of tasks.   Cognition Overall Cognitive Status: Within Functional Limits for tasks assessed Arousal/Alertness: Awake/alert Orientation Level: Oriented X4 Problem Solving: Appears intact Safety/Judgment: Appears intact Sensation Sensation Light Touch: Impaired  Detail Light Touch Impaired Details: Impaired RUE Coordination Gross Motor Movements are Fluid and Coordinated: Yes Fine Motor Movements are Fluid and Coordinated: No Motor  Motor Motor: Within Functional Limits Motor - Discharge Observations: Pt mod I during unsupported sitting when completing functional tasks. Requires use of walker/ stabilizer during dynamic standing tasks Mobility  Bed Mobility Bed Mobility: Sit to Supine Rolling Left: 6: Modified independent (Device/Increase time) Left Sidelying to Sit: 6: Modified independent (Device/Increase time) Supine to Sit: 6: Modified independent (Device/Increase time) Sit to Supine: 6: Modified independent (Device/Increase time) Transfers Transfers: Sit to Stand;Stand to Sit Sit to Stand: 4: Min guard Sit to Stand Details (indicate cue type and reason): Assist to stabilize RW Stand to Sit: 5: Supervision  Trunk/Postural Assessment  Cervical Assessment Cervical Assessment: Within Functional Limits Thoracic Assessment Thoracic Assessment: Within Functional Limits Lumbar Assessment Lumbar Assessment: Within Functional Limits  Balance Balance Balance Assessed: Yes Static Sitting Balance Static Sitting - Balance Support: No upper extremity supported;Feet supported Static Sitting - Level of Assistance: 6: Modified independent (Device/Increase time) Static Sitting - Comment/# of Minutes: To complete bathing/ dressing tasks Dynamic Sitting Balance Dynamic Sitting - Balance Support: Feet unsupported;During functional activity Dynamic Sitting - Level of Assistance: 6: Modified independent (Device/Increase time) Dynamic Sitting - Balance Activities: Lateral lean/weight shifting;Reaching for objects;Reaching across midline;Forward lean/weight shifting;Trunk control activities;[place];Reaching for weighted objects Sitting balance - Comments: During functional tasks with prosthesis donned Static Standing Balance Static Standing - Balance  Support: During functional activity Static Standing - Level of Assistance: 5: Stand by assistance;4: Min assist Static Standing - Comment/# of Minutes: During functional standing task in the kitchen Dynamic Standing Balance Dynamic Standing - Balance Support: Bilateral upper extremity supported;During functional activity Dynamic Standing - Level of Assistance: 4: Min assist Dynamic Standing - Balance Activities: Reaching across midline;Reaching for objects;[place];Reaching for weighted objects  Extremity/Trunk Assessment RUE Assessment RUE Assessment: Exceptions to [facility] RUE AROM (degrees) Overall AROM Right Upper Extremity: Deficits;Due to pain RUE Strength RUE Overall Strength: Within Functional Limits for tasks performed LUE Assessment LUE Assessment: Within Functional Limits LUE AROM (degrees) Overall AROM Left Upper Extremity: Within functional limits for tasks assessed LUE Strength LUE Overall Strength: Within Functional Limits for tasks assessed  See FIM for current functional status  Lewis, Gwinda Passe 05/03/2014, 5:18 PM

## 2014-05-03 NOTE — Plan of Care (Signed)
Problem: RH BOWEL ELIMINATION Goal: RH STG MANAGE BOWEL WITH ASSISTANCE STG Manage Bowel with min Assistance.  Outcome: Completed/Met Date Met:  05/03/14 Pt was Mod I on discharge

## 2014-05-03 NOTE — Plan of Care (Signed)
Problem: RH SAFETY Goal: RH STG ADHERE TO SAFETY PRECAUTIONS W/ASSISTANCE/DEVICE STG Adhere to Safety Precautions With min Assistance/Device.  Outcome: Completed/Met Date Met:  05/03/14 No unsafe behavior. Mod I transfer from bed to w/c

## 2014-05-04 DIAGNOSIS — D638 Anemia in other chronic diseases classified elsewhere: Secondary | ICD-10-CM

## 2014-05-04 LAB — CBC
HCT: 29.3 % — ABNORMAL LOW (ref 39.0–52.0)
HEMOGLOBIN: 9.2 g/dL — AB (ref 13.0–17.0)
MCH: 28.8 pg (ref 26.0–34.0)
MCHC: 31.4 g/dL (ref 30.0–36.0)
MCV: 91.8 fL (ref 78.0–100.0)
PLATELETS: 315 10*3/uL (ref 150–400)
RBC: 3.19 MIL/uL — ABNORMAL LOW (ref 4.22–5.81)
RDW: 14.6 % (ref 11.5–15.5)
WBC: 9.9 10*3/uL (ref 4.0–10.5)

## 2014-05-04 LAB — GLUCOSE, CAPILLARY
GLUCOSE-CAPILLARY: 68 mg/dL — AB (ref 70–99)
Glucose-Capillary: 109 mg/dL — ABNORMAL HIGH (ref 70–99)

## 2014-05-04 LAB — RENAL FUNCTION PANEL
Albumin: 3 g/dL — ABNORMAL LOW (ref 3.5–5.2)
Anion gap: 10 (ref 5–15)
BUN: 31 mg/dL — AB (ref 6–23)
CHLORIDE: 98 mmol/L (ref 96–112)
CO2: 27 mmol/L (ref 19–32)
Calcium: 8.9 mg/dL (ref 8.4–10.5)
Creatinine, Ser: 5.41 mg/dL — ABNORMAL HIGH (ref 0.50–1.35)
GFR calc Af Amer: 11 mL/min — ABNORMAL LOW (ref >90)
GFR calc non Af Amer: 9 mL/min — ABNORMAL LOW (ref >90)
GLUCOSE: 52 mg/dL — AB (ref 70–99)
POTASSIUM: 3.8 mmol/L (ref 3.5–5.1)
Phosphorus: 3.9 mg/dL (ref 2.3–4.6)
Sodium: 135 mmol/L (ref 135–145)

## 2014-05-04 MED ORDER — DOXERCALCIFEROL 4 MCG/2ML IV SOLN
INTRAVENOUS | Status: AC
  Administered 2014-05-04: 2 ug via INTRAVENOUS
  Filled 2014-05-04: qty 2

## 2014-05-04 NOTE — Progress Notes (Signed)
Patient discharge to home today. Wife came to pick him up at noon. NT wheeled patient out of the unit to his car. No issues or questions about his discharge instruction.

## 2014-05-04 NOTE — Progress Notes (Signed)
Aaron Long is a 74 y.o. male 09-12-1940 [id number]  Subjective: No new complaints. Pt is getting a HD. Slept well. Feeling OK.  Objective: Vital signs in last 24 hours: Temp:  [98.8 F (37.1 C)-99.3 F (37.4 C)] 98.8 F (37.1 C) (04/23 0645) Pulse Rate:  [76-86] 81 (04/23 1000) Resp:  [18] 18 (04/23 0650) BP: (114-184)/(55-81) 114/69 mmHg (04/23 1000) SpO2:  [96 %-98 %] 97 % (04/23 0645) Weight:  [191 lb 12.8 oz (87 kg)-192 lb 3.9 oz (87.2 kg)] 192 lb 3.9 oz (87.2 kg) (04/23 0645) Weight change: -4 lb 6.6 oz (-2 kg) Last BM Date: 05/03/14  Intake/Output from previous day: 04/22 0701 - 04/23 0700 In: 840 [P.O.:840] Out: 451 [Urine:450; Stool:1] Last cbgs: CBG (last 3)   Recent Labs  05/03/14 1113 05/03/14 1613 05/03/14 2132  GLUCAP 143* 94 138*     Physical Exam General: No apparent distress   HEENT: not dry Lungs: Normal effort. Lungs clear to auscultation, no crackles or wheezes. Cardiovascular: Regular rate and rhythm, no edema Abdomen: S/NT/ND; BS(+) Musculoskeletal:  unchanged Neurological: No new neurological deficits Wounds: N/A    Skin: clear  Aging changes Mental state: Alert, oriented, cooperative    Lab Results: BMET    Component Value Date/Time   NA 135 05/04/2014 0821   NA 144 04/02/2014 1540   K 3.8 05/04/2014 0821   CL 98 05/04/2014 0821   CO2 27 05/04/2014 0821   GLUCOSE 52* 05/04/2014 0821   GLUCOSE 61* 04/02/2014 1540   BUN 31* 05/04/2014 0821   BUN 45* 04/02/2014 1540   CREATININE 5.41* 05/04/2014 0821   CALCIUM 8.9 05/04/2014 0821   GFRNONAA 9* 05/04/2014 0821   GFRAA 11* 05/04/2014 0821   CBC    Component Value Date/Time   WBC 9.9 05/04/2014 0822   WBC 6.2 04/02/2014 1540   RBC 3.19* 05/04/2014 0822   RBC 3.89* 04/02/2014 1540   HGB 9.2* 05/04/2014 0822   HCT 29.3* 05/04/2014 0822   PLT 315 05/04/2014 0822   MCV 91.8 05/04/2014 0822   MCH 28.8 05/04/2014 0822   MCH 28.3 04/02/2014 1540   MCHC 31.4 05/04/2014  0822   MCHC 30.6* 04/02/2014 1540   RDW 14.6 05/04/2014 0822   RDW 14.9 04/02/2014 1540   LYMPHSABS 2.0 04/02/2014 1540   LYMPHSABS 2.0 04/20/2013 1610   MONOABS 0.6 04/20/2013 1610   EOSABS 0.2 04/02/2014 1540   EOSABS 0.2 04/20/2013 1610   BASOSABS 0.1 04/02/2014 1540   BASOSABS 0.0 04/20/2013 1610    Studies/Results: No results found.  Medications: I have reviewed the patient's current medications.  Assessment/Plan:   1. Functional deficits secondary to Debility due to renal failure new HD in a patient with recent BKA's 2. DVT Prophylaxis/Anticoagulation: Pharmaceutical: Lovenox 3. Neuropathy/Pain Management: Continue lyrica daily (max renal dose). Added LOW dose tramadol for breakthrough phantom limb pain 4. Mood: Team to provide ego support. LCSW to follow for evaluation and support.  5. Neuropsych: This patient is capable of making decisions on his own behalf. 6. Skin/Wound Care: Routine pressure relief measures. Monitor graft site for healing.  7. Fluids/Electrolytes/Nutrition: 1200 cc/FR daily to avoid overload. Dietary ed.  8. DM type 2: Continue Levemir. SSI for elevated BS. Will monitor BS with achs checks.  -added am dose of levemir which is at 15u -->now on 15u bid---continue this regimen at home 9. HTN: monitor BP every 8 hours. Continue Norvasc daily, cardura daily and apresoline tid.  10. Anemia of chronic disease: On  aranesp daily 11. ESRD: To continue HD on TTS. dialysis sessions occur at end of day to allow therapy and manage post procedure fatigue.  Will d/c today   Length of stay, days: 9  Walker Kehr , MD 05/04/2014, 10:11 AM

## 2014-05-06 ENCOUNTER — Encounter: Payer: Medicare Other | Admitting: Physical Therapy

## 2014-05-06 NOTE — Progress Notes (Signed)
Social Work  Discharge Note  The overall goal for the admission was met for:   Discharge location: Yes - home with wife and nephew who can provide 24/7 assistance  Length of Stay: Yes - 9 days  Discharge activity level: Yes - modified independent - min assist  Home/community participation: Yes  Services provided included: MD, RD, PT, OT, RN, TR, Pharmacy, Neuropsych and SW  Financial Services: Medicare and Private Insurance: [place]  Follow-up services arranged: Outpatient: PT via [facility] (resume wiht prosthetic clinician), DME: switch out of 20"W w/c to a 18"W w/c via [facility] and Patient/Family has no preference for HH/DME agencies  Comments (or additional information):  Patient/Family verbalized understanding of follow-up arrangements: Yes  Individual responsible for coordination of the follow-up plan: patient  Confirmed correct DME delivered: Obinna Ehresman 05/06/2014    Silus Lanzo

## 2014-05-07 DIAGNOSIS — E119 Type 2 diabetes mellitus without complications: Secondary | ICD-10-CM | POA: Diagnosis not present

## 2014-05-07 DIAGNOSIS — D509 Iron deficiency anemia, unspecified: Secondary | ICD-10-CM | POA: Diagnosis not present

## 2014-05-07 DIAGNOSIS — D631 Anemia in chronic kidney disease: Secondary | ICD-10-CM | POA: Diagnosis not present

## 2014-05-07 DIAGNOSIS — N186 End stage renal disease: Secondary | ICD-10-CM | POA: Diagnosis not present

## 2014-05-07 DIAGNOSIS — E214 Other specified disorders of parathyroid gland: Secondary | ICD-10-CM | POA: Diagnosis not present

## 2014-05-08 ENCOUNTER — Encounter: Payer: Medicare Other | Admitting: Physical Therapy

## 2014-05-09 DIAGNOSIS — E119 Type 2 diabetes mellitus without complications: Secondary | ICD-10-CM | POA: Diagnosis not present

## 2014-05-09 DIAGNOSIS — D631 Anemia in chronic kidney disease: Secondary | ICD-10-CM | POA: Diagnosis not present

## 2014-05-09 DIAGNOSIS — N186 End stage renal disease: Secondary | ICD-10-CM | POA: Diagnosis not present

## 2014-05-09 DIAGNOSIS — D509 Iron deficiency anemia, unspecified: Secondary | ICD-10-CM | POA: Diagnosis not present

## 2014-05-09 DIAGNOSIS — E214 Other specified disorders of parathyroid gland: Secondary | ICD-10-CM | POA: Diagnosis not present

## 2014-05-11 DIAGNOSIS — E119 Type 2 diabetes mellitus without complications: Secondary | ICD-10-CM | POA: Diagnosis not present

## 2014-05-11 DIAGNOSIS — N186 End stage renal disease: Secondary | ICD-10-CM | POA: Diagnosis not present

## 2014-05-11 DIAGNOSIS — D631 Anemia in chronic kidney disease: Secondary | ICD-10-CM | POA: Diagnosis not present

## 2014-05-11 DIAGNOSIS — E214 Other specified disorders of parathyroid gland: Secondary | ICD-10-CM | POA: Diagnosis not present

## 2014-05-11 DIAGNOSIS — D509 Iron deficiency anemia, unspecified: Secondary | ICD-10-CM | POA: Diagnosis not present

## 2014-05-13 ENCOUNTER — Ambulatory Visit (INDEPENDENT_AMBULATORY_CARE_PROVIDER_SITE_OTHER): Payer: Medicare Other | Admitting: Nurse Practitioner

## 2014-05-13 ENCOUNTER — Encounter: Payer: Self-pay | Admitting: Nurse Practitioner

## 2014-05-13 VITALS — BP 150/68 | HR 85 | Temp 98.3°F | Ht 68.0 in | Wt 200.0 lb

## 2014-05-13 DIAGNOSIS — E1165 Type 2 diabetes mellitus with hyperglycemia: Secondary | ICD-10-CM

## 2014-05-13 DIAGNOSIS — Z89512 Acquired absence of left leg below knee: Secondary | ICD-10-CM | POA: Diagnosis not present

## 2014-05-13 DIAGNOSIS — E1129 Type 2 diabetes mellitus with other diabetic kidney complication: Secondary | ICD-10-CM | POA: Diagnosis not present

## 2014-05-13 DIAGNOSIS — N186 End stage renal disease: Secondary | ICD-10-CM | POA: Diagnosis not present

## 2014-05-13 DIAGNOSIS — IMO0002 Reserved for concepts with insufficient information to code with codable children: Secondary | ICD-10-CM

## 2014-05-13 DIAGNOSIS — I10 Essential (primary) hypertension: Secondary | ICD-10-CM | POA: Diagnosis not present

## 2014-05-13 DIAGNOSIS — K5909 Other constipation: Secondary | ICD-10-CM

## 2014-05-13 DIAGNOSIS — Z992 Dependence on renal dialysis: Secondary | ICD-10-CM | POA: Diagnosis not present

## 2014-05-13 DIAGNOSIS — Z89511 Acquired absence of right leg below knee: Secondary | ICD-10-CM | POA: Diagnosis not present

## 2014-05-13 MED ORDER — HYDROCODONE-ACETAMINOPHEN 5-325 MG PO TABS: ORAL_TABLET | ORAL | Status: DC

## 2014-05-13 NOTE — Patient Instructions (Signed)
Okay for physical therapy as tolerates   To take hydralazine every 8 hours except on dialysis days to only take twice daily in the AM and PM on dialysis days unless directed differently at dialysis  STOP tramadol Will restart Vicodin to take twice daily as needed for pain  Follow up in 1 month  Hypoglycemia Hypoglycemia occurs when the glucose in your blood is too low. Glucose is a type of sugar that is your body's main energy source. Hormones, such as insulin and glucagon, control the level of glucose in the blood. Insulin lowers blood glucose and glucagon increases blood glucose. Having too much insulin in your blood stream, or not eating enough food containing sugar, can result in hypoglycemia. Hypoglycemia can happen to people with or without diabetes. It can develop quickly and can be a medical emergency.  CAUSES   Missing or delaying meals.  Not eating enough carbohydrates at meals.  Taking too much diabetes medicine.  Not timing your oral diabetes medicine or insulin doses with meals, snacks, and exercise.  Nausea and vomiting.  Certain medicines.  Severe illnesses, such as hepatitis, kidney disorders, and certain eating disorders.  Increased activity or exercise without eating something extra or adjusting medicines.  Drinking too much alcohol.  A nerve disorder that affects body functions like your heart rate, blood pressure, and digestion (autonomic neuropathy).  A condition where the stomach muscles do not function properly (gastroparesis). Therefore, medicines and food may not absorb properly.  Rarely, a tumor of the pancreas can produce too much insulin. SYMPTOMS   Hunger.  Sweating (diaphoresis).  Change in body temperature.  Shakiness.  Headache.  Anxiety.  Lightheadedness.  Irritability.  Difficulty concentrating.  Dry mouth.  Tingling or numbness in the hands or feet.  Restless sleep or sleep disturbances.  Altered speech and  coordination.  Change in mental status.  Seizures or prolonged convulsions.  Combativeness.  Drowsiness (lethargic).  Weakness.  Increased heart rate or palpitations.  Confusion.  Pale, gray skin color.  Blurred or double vision.  Fainting. DIAGNOSIS  A physical exam and medical history will be performed. Your caregiver may make a diagnosis based on your symptoms. Blood tests and other lab tests may be performed to confirm a diagnosis. Once the diagnosis is made, your caregiver will see if your signs and symptoms go away once your blood glucose is raised.  TREATMENT  Usually, you can easily treat your hypoglycemia when you notice symptoms.  Check your blood glucose. If it is less than 70 mg/dl, take one of the following:   3-4 glucose tablets.    cup juice.    cup regular soda.   1 cup skim milk.   -1 tube of glucose gel.   5-6 hard candies.   Avoid high-fat drinks or food that may delay a rise in blood glucose levels.  Do not take more than the recommended amount of sugary foods, drinks, gel, or tablets. Doing so will cause your blood glucose to go too high.   Wait 10-15 minutes and recheck your blood glucose. If it is still less than 70 mg/dl or below your target range, repeat treatment.   Eat a snack if it is more than 1 hour until your next meal.  There may be a time when your blood glucose may go so low that you are unable to treat yourself at home when you start to notice symptoms. You may need someone to help you. You may even faint or be unable to  swallow. If you cannot treat yourself, someone will need to bring you to the hospital.  [place]  If you have diabetes, follow your diabetes management plan by:  Taking your medicines as directed.  Following your exercise plan.  Following your meal plan. Do not skip meals. Eat on time.  Testing your blood glucose regularly. Check your blood glucose before and after exercise. If you  exercise longer or different than usual, be sure to check blood glucose more frequently.  Wearing your medical alert jewelry that says you have diabetes.  Identify the cause of your hypoglycemia. Then, develop ways to prevent the recurrence of hypoglycemia.  Do not take a hot bath or shower right after an insulin shot.  Always carry treatment with you. Glucose tablets are the easiest to carry.  If you are going to drink alcohol, drink it only with meals.  Tell friends or family members ways to keep you safe during a seizure. This may include removing hard or sharp objects from the area or turning you on your side.  Maintain a healthy weight. SEEK MEDICAL CARE IF:   You are having problems keeping your blood glucose in your target range.  You are having frequent episodes of hypoglycemia.  You feel you might be having side effects from your medicines.  You are not sure why your blood glucose is dropping so low.  You notice a change in vision or a new problem with your vision. SEEK IMMEDIATE MEDICAL CARE IF:   Confusion develops.  A change in mental status occurs.  The inability to swallow develops.  Fainting occurs. Document Released: 12/28/2004 Document Revised: 01/02/2013 Document Reviewed: 04/26/2011 [facility] Patient Information 2015 [place], [place]. This information is not intended to replace advice given to you by your health care provider. Make sure you discuss any questions you have with your health care provider.

## 2014-05-13 NOTE — Progress Notes (Signed)
Patient ID: Aaron Long, male   DOB: [birth year], 74 y.o.   MRN: [id number]    PCP: [name], NP  Allergies  Allergen Reactions  . Oxycodone Other (See Comments)    Hallucinations    Chief Complaint  Patient presents with  . Hospitalization Follow-up    Seen in hospital on 04/25/14, discuss medications. Patient was taken off vicodin and metoprolol      HPI: Patient is a 74 y.o. male seen in the office today for hospital follow up. Pt with history of HTN, CAD, CKD, B-BKA, chronic pain; who had been reluctant to get HD in the past and was admitted on 04/17/14 with syncope, tremors and recurrent hyperkalemia. He was treated with Kayexalate, insulin and lasix and was agreeable to start to HD. Dialysis catheter and AV fistula created on 04/08 by Dr. Kellie Simmering and HD initiated. Patient has had improvement in energy levels and po intake and nighttime confusion is resolving. Therapy ongoing and patient limited by RUE pain with limited use as well as deconditioning. Pt was treated with outpatient therapy for prosthetic training and ambulating.   Currently going to HD Tuesday, Thursday and saturdays and reports this is going good. Has temp access while awaiting graph.   Questions about blood pressure medication while on dialysis. Told him to stop taking metoprolol. Also made a comment about his blood pressure getting low the last time he was at dialysis.   Blood sugars- in the morning 70-80, 110 and in the evening between 150-170. Taking levemir 15 units twice daily, and taking novolog 5 units if blood sugars over 150.    Tramadol has not been helping, was not helping during rehab. Unsure why they stopped his Vicodin, it was effective and without side effects.   Review of Systems:  Review of Systems  Constitutional: Negative for diaphoresis.  HENT: Negative for congestion and sore throat.   Respiratory: Negative for cough, shortness of breath and wheezing.   Cardiovascular:  Negative for chest pain and leg swelling.  Gastrointestinal: Negative for nausea, vomiting, abdominal pain, diarrhea and constipation.  Genitourinary: Negative for dysuria.  Musculoskeletal: Positive for myalgias (in bilateral stump).  Skin: Negative for rash.  Neurological: Positive for weakness. Negative for dizziness and headaches.  Psychiatric/Behavioral: The patient is not nervous/anxious.     Past Medical History  Diagnosis Date  . PAD (peripheral artery disease)     a. R CEA 2007. b. Hx L BKA in 2013. c. s/p LE angioplasty in 2014. d. Hx R BKA in 2014.  . Gangrene of toe     dry  . Neuromuscular disorder     diabetic neuropathy  . Hypertension   . Hyperlipidemia   . Peripheral neuropathy   . Constipation     takes Miralax daily  . Hemorrhoids   . Hx of colonic polyps   . Coronary artery disease     a. s/p NSTEMI/CABGx4 in 2007 - LIMA-LAD, SVG-optional diagonal, SVVG-OM, SVG-dRCA. b. Nuc 05/2011: nonischemic.  [name] BPH (benign prostatic hypertrophy)   . Abnormality of gait   . DVT (deep venous thrombosis)     a. Lower extremity DVT in 2013.  [name] Hypertrophy of prostate without urinary obstruction and other lower urinary tract symptoms (LUTS)   . Disorder of bone and cartilage, unspecified   . Lower limb amputation, below knee   . Anemia   . Secondary hyperparathyroidism     Secondary Hyperpara- Thyroidism, Renal  . Ischemic cardiomyopathy     a.  EF 40% in 2007. b. 51% by nuc 05/2011.  [name] Complication of anesthesia     "he gets delirious"  . Myocardial infarction 2005  . Type II diabetes mellitus   . Diabetic peripheral neuropathy   . ESRD (end stage renal disease)     "not ready yet for dialysis" (04/17/2014)  . Kidney stones   . Prostate cancer 2010   Past Surgical History  Procedure Laterality Date  . Prostatectomy  2009  . Knee cartilage surgery Left 1964  . Coronary artery bypass graft  2005    CABG X4  . Carotid endarterectomy Right 2005  . Cataract extraction  w/ intraocular lens  implant, bilateral Bilateral 2011  . Colonoscopy    . Cardiac catheterization  05/28/11  . Amputation  06/14/2011    Procedure: AMPUTATION DIGIT;  Surgeon: Elam Dutch, MD;  Location: [place] OR;  Service: Vascular;  Laterality: Left;  Amputation Left fifth toe  . Amputation  06/16/2011    Procedure: AMPUTATION BELOW KNEE;  Surgeon: Elam Dutch, MD;  Location: [place];  Service: Vascular;  Laterality: Left;  . I&d extremity  01/31/2012    Procedure: IRRIGATION AND DEBRIDEMENT EXTREMITY;  Surgeon: Elam Dutch, MD;  Location: [place];  Service: Vascular;  Laterality: Left;  I & D Left BKA   . Amputation Right 06/12/2012    Procedure: AMPUTATION DIGIT;  Surgeon: Elam Dutch, MD;  Location: [facility] OR;  Service: Vascular;  Laterality: Right;  GREAT TOE  . Amputation Right 08/08/2012    Procedure: AMPUTATION BELOW KNEE;  Surgeon: Elam Dutch, MD;  Location[facility] OR;  Service: Vascular;  Laterality: Right;  . Abdominal aortagram N/A 05/28/2011    Procedure: ABDOMINAL Maxcine Ham;  Surgeon: Elam Dutch, MD;  Location: [facility] CATH LAB;  Service: Cardiovascular;  Laterality: N/A;  . Abdominal aortagram N/A 06/02/2012    Procedure: ABDOMINAL Maxcine Ham;  Surgeon: Elam Dutch, MD;  Location: [facility] CATH LAB;  Service: Cardiovascular;  Laterality: N/A;  . Abdominal aortagram N/A 06/09/2012    Procedure: ABDOMINAL Maxcine Ham;  Surgeon: Elam Dutch, MD;  Location: [facility] CATH LAB;  Service: Cardiovascular;  Laterality: N/A;  . Insertion of dialysis catheter Right 04/19/2014    Procedure: INSERTION OF DIALYSIS CATHETER-INTERNAL JUGULAR;  Surgeon: Mal Misty, MD;  Location: [place];  Service: Vascular;  Laterality: Right;  . Av fistula placement Right 04/19/2014    Procedure: RIGHT ARM ARTERIOVENOUS (AV) FISTULA CREATION;  Surgeon: Mal Misty, MD;  Location: [place];  Service: Vascular;  Laterality: Right;   Social History:   reports that he quit smoking about 23 years ago. His smoking use  included Cigarettes. He has a 30 pack-year smoking history. He has never used smokeless tobacco. He reports that he drinks alcohol. He reports that he does not use illicit drugs.  Family History  Problem Relation Age of Onset  . Coronary artery disease Neg Hx   . Anesthesia problems Neg Hx   . Hypotension Neg Hx   . Malignant hyperthermia Neg Hx   . Pseudochol deficiency Neg Hx   . Hyperlipidemia Mother   . Hypertension Mother   . Cancer Mother   . Hyperlipidemia Father   . Hypertension Father   . Kidney disease Father   . Heart disease Sister   . Alzheimer's disease Sister   . Cancer Brother   . Other Sister     Medications: Patient's Medications  New Prescriptions   No medications on file  Previous Medications   AMBULATORY NON FORMULARY MEDICATION    BD U/P Mini Pen [place] 31GX5MM Use three times daily as directed DX:250.00   AMBULATORY NON FORMULARY MEDICATION    One Touch Ultra 2 Test Strips Sig:  Check blood sugars Three times daily to keep blood sugars under control Dx: 250.00, 401.9   AMLODIPINE (NORVASC) 10 MG TABLET    Take 1 tablet (10 mg total) by mouth daily.   ASPIRIN EC 81 MG TABLET    Take 1 tablet (81 mg total) by mouth every morning.   CALCIUM ACETATE (PHOSLO) 667 MG CAPSULE    Take 1 capsule (667 mg total) by mouth 3 (three) times daily with meals.   CLOPIDOGREL (PLAVIX) 75 MG TABLET    take 1 tablet by mouth once daily   DOXAZOSIN (CARDURA) 1 MG TABLET    take 1 tablet by mouth at bedtime   FAMOTIDINE (PEPCID) 20 MG TABLET    Take 1 tablet (20 mg total) by mouth at bedtime.   HYDRALAZINE (APRESOLINE) 100 MG TABLET    Take 100 mg by mouth every 8 (eight) hours.   INSULIN ASPART (NOVOLOG) 100 UNIT/ML FLEXPEN    Inject 5 Units into the skin 3 (three) times daily with meals. Inject 5 units three times a day with meals if blood sugar greater than 150 only, DX: 250.71   INSULIN DETEMIR (LEVEMIR) 100 UNIT/ML PEN    Inject 15 Units into the skin 2 (two) times  daily.   MISC. DEVICES [facility]) MISC    Patient needs new Wheelchair due to his being broken.   MULTIVITAMIN (RENA-VIT) TABS TABLET    Take 1 tablet by mouth at bedtime.   PRAVASTATIN (PRAVACHOL) 20 MG TABLET    Take 1 tablet (20 mg total) by mouth daily.   PREGABALIN (LYRICA) 75 MG CAPSULE    Take 1 capsule (75 mg total) by mouth daily.   QUETIAPINE (SEROQUEL) 25 MG TABLET    Take 1 tablet (25 mg total) by mouth daily.   SENNA-DOCUSATE (SENOKOT-S) 8.6-50 MG PER TABLET    Take 2 tablets by mouth 2 (two) times daily.   TRAMADOL (ULTRAM) 50 MG TABLET    Take 0.5 tablets (25 mg total) by mouth every 12 (twelve) hours as needed for severe pain.  Modified Medications   No medications on file  Discontinued Medications   HYDRALAZINE (APRESOLINE) 25 MG TABLET    Take 1 tablet (25 mg total) by mouth 2 (two) times daily.   NUTRITIONAL SUPPLEMENTS (FEEDING SUPPLEMENT, NEPRO CARB STEADY,) LIQD    Take 237 mLs by mouth daily at 3 pm.     Physical Exam:  Filed Vitals:   05/13/14 1258  BP: 150/68  Pulse: 85  Temp: 98.3 F (36.8 C)  TempSrc: Oral  Height: 5\' 8"  (1.727 m)  Weight: 200 lb (90.719 kg)  SpO2: 95%    Physical Exam  Constitutional: He is oriented to person, place, and time. He appears well-developed and well-nourished.  HENT:  Head: Normocephalic and atraumatic.  Neck: Normal range of motion. Neck supple.  Cardiovascular: Normal rate, regular rhythm and normal heart sounds.   Pulmonary/Chest: Effort normal and breath sounds normal. No respiratory distress.  Right subclavian catheter for HD  Abdominal: Soft. Bowel sounds are normal.  Musculoskeletal: He exhibits no edema or tenderness.  bilateral BKA with prosthesis  Neurological: He is alert and oriented to person, place, and time.  Skin: Skin is warm.  Psychiatric: He has a normal mood  and affect.  Nursing note and vitals reviewed.   Labs reviewed: Basic Metabolic Panel:  Recent Labs  04/29/14 1628 05/02/14 0653  05/04/14 0821  NA 131* 134* 135  K 4.3 4.2 3.8  CL 93* 95* 98  CO2 23 27 27   GLUCOSE 81 139* 52*  BUN 70* 46* 31*  CREATININE 9.61* 7.58* 5.41*  CALCIUM 8.8 8.9 8.9  PHOS 7.9* 6.7* 3.9   Liver Function Tests:  Recent Labs  09/26/13 1544 04/02/14 1540 04/17/14 0700  04/29/14 1628 05/02/14 0653 05/04/14 0821  AST 12 14 29   --   --   --   --   ALT 10 6 38  --   --   --   --   ALKPHOS 101 121* 121*  --   --   --   --   BILITOT <0.2 <0.2 0.5  --   --   --   --   PROT 6.4 6.4 6.4  --   --   --   --   ALBUMIN  --   --  3.6  < > 3.3* 3.1* 3.0*  < > = values in this interval not displayed.  Recent Labs  04/17/14 0700  LIPASE 50   No results for input(s): AMMONIA in the last 8760 hours. CBC:  Recent Labs  04/02/14 1540  04/29/14 1628 05/02/14 0659 05/04/14 0822  WBC 6.2  < > 9.5 7.7 9.9  NEUTROABS 3.6  --   --   --   --   HGB 11.0*  < > 9.3* 9.2* 9.2*  HCT 36.0*  < > 29.1* 29.4* 29.3*  MCV 93  < > 89.8 91.3 91.8  PLT 211  < > 274 306 315  < > = values in this interval not displayed. Lipid Panel:  Recent Labs  09/26/13 1544 04/02/14 1540  CHOL 168 147  HDL 35* 36*  LDLCALC 101* 97  TRIG 158* 68  CHOLHDL 4.8 4.1   TSH: No results for input(s): TSH in the last 8760 hours. A1C: Lab Results  Component Value Date   HGBA1C 7.2* 04/02/2014     Assessment/Plan 1. DM type 2, uncontrolled, with renal complications -improved controlled with current levemir and novolog.  -discussed hypoglycemia and warning signs -to record blood sugars and bring to next visit -will cont current regimen   2. Status post bilateral below knee amputation -conts with therapy, pain in bilateral stumps that ultram does not help. Vicodin effective in the past with side effects would like to try this again. Do not see anywhere why medication was stopped.  - HYDROcodone-acetaminophen (NORCO/VICODIN) 5-325 MG per tablet; Take 1 tablet by mouth every 12 hours as needed for pain   Dispense: 60 tablet; Refill: 0  3. Other constipation -chronic issues, to cont home regimen which has been effective  4. Essential hypertension -pt now on HD. Will keep off metoprolol, Will change hydralazine to BID on diaylsis days and to cont TID on all other days. To cont norvasc as prescribed.   5. CKD (chronic kidney disease) stage V requiring chronic dialysis -conts on HD, tolerating well.    Follow up in 1 month on pain, diabetes and HTN

## 2014-05-14 DIAGNOSIS — N186 End stage renal disease: Secondary | ICD-10-CM | POA: Diagnosis not present

## 2014-05-14 DIAGNOSIS — E119 Type 2 diabetes mellitus without complications: Secondary | ICD-10-CM | POA: Diagnosis not present

## 2014-05-14 DIAGNOSIS — N2581 Secondary hyperparathyroidism of renal origin: Secondary | ICD-10-CM | POA: Diagnosis not present

## 2014-05-14 DIAGNOSIS — D631 Anemia in chronic kidney disease: Secondary | ICD-10-CM | POA: Diagnosis not present

## 2014-05-14 DIAGNOSIS — D509 Iron deficiency anemia, unspecified: Secondary | ICD-10-CM | POA: Diagnosis not present

## 2014-05-14 DIAGNOSIS — E214 Other specified disorders of parathyroid gland: Secondary | ICD-10-CM | POA: Diagnosis not present

## 2014-05-16 DIAGNOSIS — E119 Type 2 diabetes mellitus without complications: Secondary | ICD-10-CM | POA: Diagnosis not present

## 2014-05-16 DIAGNOSIS — D631 Anemia in chronic kidney disease: Secondary | ICD-10-CM | POA: Diagnosis not present

## 2014-05-16 DIAGNOSIS — N186 End stage renal disease: Secondary | ICD-10-CM | POA: Diagnosis not present

## 2014-05-16 DIAGNOSIS — D509 Iron deficiency anemia, unspecified: Secondary | ICD-10-CM | POA: Diagnosis not present

## 2014-05-16 DIAGNOSIS — E214 Other specified disorders of parathyroid gland: Secondary | ICD-10-CM | POA: Diagnosis not present

## 2014-05-16 DIAGNOSIS — N2581 Secondary hyperparathyroidism of renal origin: Secondary | ICD-10-CM | POA: Diagnosis not present

## 2014-05-18 DIAGNOSIS — E214 Other specified disorders of parathyroid gland: Secondary | ICD-10-CM | POA: Diagnosis not present

## 2014-05-18 DIAGNOSIS — N2581 Secondary hyperparathyroidism of renal origin: Secondary | ICD-10-CM | POA: Diagnosis not present

## 2014-05-18 DIAGNOSIS — N186 End stage renal disease: Secondary | ICD-10-CM | POA: Diagnosis not present

## 2014-05-18 DIAGNOSIS — D631 Anemia in chronic kidney disease: Secondary | ICD-10-CM | POA: Diagnosis not present

## 2014-05-18 DIAGNOSIS — D509 Iron deficiency anemia, unspecified: Secondary | ICD-10-CM | POA: Diagnosis not present

## 2014-05-18 DIAGNOSIS — E119 Type 2 diabetes mellitus without complications: Secondary | ICD-10-CM | POA: Diagnosis not present

## 2014-05-21 DIAGNOSIS — E214 Other specified disorders of parathyroid gland: Secondary | ICD-10-CM | POA: Diagnosis not present

## 2014-05-21 DIAGNOSIS — D509 Iron deficiency anemia, unspecified: Secondary | ICD-10-CM | POA: Diagnosis not present

## 2014-05-21 DIAGNOSIS — N186 End stage renal disease: Secondary | ICD-10-CM | POA: Diagnosis not present

## 2014-05-21 DIAGNOSIS — E119 Type 2 diabetes mellitus without complications: Secondary | ICD-10-CM | POA: Diagnosis not present

## 2014-05-21 DIAGNOSIS — N2581 Secondary hyperparathyroidism of renal origin: Secondary | ICD-10-CM | POA: Diagnosis not present

## 2014-05-21 DIAGNOSIS — D631 Anemia in chronic kidney disease: Secondary | ICD-10-CM | POA: Diagnosis not present

## 2014-05-23 DIAGNOSIS — N186 End stage renal disease: Secondary | ICD-10-CM | POA: Diagnosis not present

## 2014-05-23 DIAGNOSIS — D509 Iron deficiency anemia, unspecified: Secondary | ICD-10-CM | POA: Diagnosis not present

## 2014-05-23 DIAGNOSIS — E214 Other specified disorders of parathyroid gland: Secondary | ICD-10-CM | POA: Diagnosis not present

## 2014-05-23 DIAGNOSIS — N2581 Secondary hyperparathyroidism of renal origin: Secondary | ICD-10-CM | POA: Diagnosis not present

## 2014-05-23 DIAGNOSIS — D631 Anemia in chronic kidney disease: Secondary | ICD-10-CM | POA: Diagnosis not present

## 2014-05-23 DIAGNOSIS — E119 Type 2 diabetes mellitus without complications: Secondary | ICD-10-CM | POA: Diagnosis not present

## 2014-05-24 ENCOUNTER — Encounter: Payer: Self-pay | Admitting: Vascular Surgery

## 2014-05-24 ENCOUNTER — Encounter (HOSPITAL_COMMUNITY): Payer: Medicare Other

## 2014-05-25 DIAGNOSIS — N2581 Secondary hyperparathyroidism of renal origin: Secondary | ICD-10-CM | POA: Diagnosis not present

## 2014-05-25 DIAGNOSIS — D631 Anemia in chronic kidney disease: Secondary | ICD-10-CM | POA: Diagnosis not present

## 2014-05-25 DIAGNOSIS — D509 Iron deficiency anemia, unspecified: Secondary | ICD-10-CM | POA: Diagnosis not present

## 2014-05-25 DIAGNOSIS — E119 Type 2 diabetes mellitus without complications: Secondary | ICD-10-CM | POA: Diagnosis not present

## 2014-05-25 DIAGNOSIS — E214 Other specified disorders of parathyroid gland: Secondary | ICD-10-CM | POA: Diagnosis not present

## 2014-05-25 DIAGNOSIS — N186 End stage renal disease: Secondary | ICD-10-CM | POA: Diagnosis not present

## 2014-05-27 ENCOUNTER — Ambulatory Visit: Payer: Medicare Other | Admitting: Physical Therapy

## 2014-05-28 ENCOUNTER — Inpatient Hospital Stay (HOSPITAL_COMMUNITY): Admission: RE | Admit: 2014-05-28 | Payer: Medicare Other | Source: Ambulatory Visit

## 2014-05-28 ENCOUNTER — Encounter: Payer: Medicare Other | Admitting: Vascular Surgery

## 2014-05-28 DIAGNOSIS — E214 Other specified disorders of parathyroid gland: Secondary | ICD-10-CM | POA: Diagnosis not present

## 2014-05-28 DIAGNOSIS — E119 Type 2 diabetes mellitus without complications: Secondary | ICD-10-CM | POA: Diagnosis not present

## 2014-05-28 DIAGNOSIS — D509 Iron deficiency anemia, unspecified: Secondary | ICD-10-CM | POA: Diagnosis not present

## 2014-05-28 DIAGNOSIS — N186 End stage renal disease: Secondary | ICD-10-CM | POA: Diagnosis not present

## 2014-05-28 DIAGNOSIS — N2581 Secondary hyperparathyroidism of renal origin: Secondary | ICD-10-CM | POA: Diagnosis not present

## 2014-05-28 DIAGNOSIS — D631 Anemia in chronic kidney disease: Secondary | ICD-10-CM | POA: Diagnosis not present

## 2014-05-30 DIAGNOSIS — E214 Other specified disorders of parathyroid gland: Secondary | ICD-10-CM | POA: Diagnosis not present

## 2014-05-30 DIAGNOSIS — N2581 Secondary hyperparathyroidism of renal origin: Secondary | ICD-10-CM | POA: Diagnosis not present

## 2014-05-30 DIAGNOSIS — N186 End stage renal disease: Secondary | ICD-10-CM | POA: Diagnosis not present

## 2014-05-30 DIAGNOSIS — E119 Type 2 diabetes mellitus without complications: Secondary | ICD-10-CM | POA: Diagnosis not present

## 2014-05-30 DIAGNOSIS — D509 Iron deficiency anemia, unspecified: Secondary | ICD-10-CM | POA: Diagnosis not present

## 2014-05-30 DIAGNOSIS — D631 Anemia in chronic kidney disease: Secondary | ICD-10-CM | POA: Diagnosis not present

## 2014-06-01 DIAGNOSIS — D509 Iron deficiency anemia, unspecified: Secondary | ICD-10-CM | POA: Diagnosis not present

## 2014-06-01 DIAGNOSIS — E214 Other specified disorders of parathyroid gland: Secondary | ICD-10-CM | POA: Diagnosis not present

## 2014-06-01 DIAGNOSIS — E119 Type 2 diabetes mellitus without complications: Secondary | ICD-10-CM | POA: Diagnosis not present

## 2014-06-01 DIAGNOSIS — N2581 Secondary hyperparathyroidism of renal origin: Secondary | ICD-10-CM | POA: Diagnosis not present

## 2014-06-01 DIAGNOSIS — N186 End stage renal disease: Secondary | ICD-10-CM | POA: Diagnosis not present

## 2014-06-01 DIAGNOSIS — D631 Anemia in chronic kidney disease: Secondary | ICD-10-CM | POA: Diagnosis not present

## 2014-06-03 ENCOUNTER — Encounter: Payer: Self-pay | Admitting: Physical Therapy

## 2014-06-03 ENCOUNTER — Ambulatory Visit: Payer: Medicare Other | Attending: Internal Medicine | Admitting: Physical Therapy

## 2014-06-03 VITALS — BP 164/70 | HR 123

## 2014-06-03 DIAGNOSIS — Z4781 Encounter for orthopedic aftercare following surgical amputation: Secondary | ICD-10-CM | POA: Diagnosis not present

## 2014-06-03 DIAGNOSIS — Z89511 Acquired absence of right leg below knee: Secondary | ICD-10-CM

## 2014-06-03 DIAGNOSIS — R269 Unspecified abnormalities of gait and mobility: Secondary | ICD-10-CM | POA: Insufficient documentation

## 2014-06-03 DIAGNOSIS — Z89612 Acquired absence of left leg above knee: Secondary | ICD-10-CM | POA: Diagnosis not present

## 2014-06-03 DIAGNOSIS — R2689 Other abnormalities of gait and mobility: Secondary | ICD-10-CM

## 2014-06-03 DIAGNOSIS — M24562 Contracture, left knee: Secondary | ICD-10-CM

## 2014-06-03 DIAGNOSIS — Z89611 Acquired absence of right leg above knee: Secondary | ICD-10-CM | POA: Diagnosis not present

## 2014-06-03 DIAGNOSIS — Z7409 Other reduced mobility: Secondary | ICD-10-CM

## 2014-06-03 DIAGNOSIS — Z89512 Acquired absence of left leg below knee: Secondary | ICD-10-CM

## 2014-06-03 DIAGNOSIS — M24561 Contracture, right knee: Secondary | ICD-10-CM

## 2014-06-04 DIAGNOSIS — D509 Iron deficiency anemia, unspecified: Secondary | ICD-10-CM | POA: Diagnosis not present

## 2014-06-04 DIAGNOSIS — N2581 Secondary hyperparathyroidism of renal origin: Secondary | ICD-10-CM | POA: Diagnosis not present

## 2014-06-04 DIAGNOSIS — E119 Type 2 diabetes mellitus without complications: Secondary | ICD-10-CM | POA: Diagnosis not present

## 2014-06-04 DIAGNOSIS — D631 Anemia in chronic kidney disease: Secondary | ICD-10-CM | POA: Diagnosis not present

## 2014-06-04 DIAGNOSIS — N186 End stage renal disease: Secondary | ICD-10-CM | POA: Diagnosis not present

## 2014-06-04 DIAGNOSIS — E214 Other specified disorders of parathyroid gland: Secondary | ICD-10-CM | POA: Diagnosis not present

## 2014-06-04 NOTE — Therapy (Signed)
[facility] [address] [place], [place], [address] Phone: [phone]   Fax:  [phone]  Physical Therapy Evaluation  Patient Details  Name: Aaron Long MRN: [id number] Date of Birth: 06-02-1940 Referring Provider:  [name], NP  Encounter Date: 06/03/2014      PT End of Session - 06/03/14 1530    Visit Number 1   Number of Visits 18   Date for PT Re-Evaluation 08/02/14   Authorization Type G Code   PT Start Time 1320   PT Stop Time 1408   PT Time Calculation (min) 48 min   Equipment Utilized During Treatment Gait belt;Other (comment)  bilateral BKA prostheses   Activity Tolerance Patient tolerated treatment well   Behavior During Therapy WFL for tasks assessed/performed      Past Medical History  Diagnosis Date  . PAD (peripheral artery disease)     a. R CEA 2007. b. Hx L BKA in 2013. c. s/p LE angioplasty in 2014. d. Hx R BKA in 2014.  . Gangrene of toe     dry  . Neuromuscular disorder     diabetic neuropathy  . Hypertension   . Hyperlipidemia   . Peripheral neuropathy   . Constipation     takes Miralax daily  . Hemorrhoids   . Hx of colonic polyps   . Coronary artery disease     a. s/p NSTEMI/CABGx4 in 2007 - LIMA-LAD, SVG-optional diagonal, SVVG-OM, SVG-dRCA. b. Nuc 05/2011: nonischemic.  [name] BPH (benign prostatic hypertrophy)   . Abnormality of gait   . DVT (deep venous thrombosis)     a. Lower extremity DVT in 2013.  [name] Hypertrophy of prostate without urinary obstruction and other lower urinary tract symptoms (LUTS)   . Disorder of bone and cartilage, unspecified   . Lower limb amputation, below knee   . Anemia   . Secondary hyperparathyroidism     Secondary Hyperpara- Thyroidism, Renal  . Ischemic cardiomyopathy     a. EF 40% in 2007. b. 51% by nuc 05/2011.  [name] Complication of anesthesia     "he gets delirious"  . Myocardial infarction 2005  . Type II diabetes mellitus   .  Diabetic peripheral neuropathy   . ESRD (end stage renal disease)     "not ready yet for dialysis" (04/17/2014)  . Kidney stones   . Prostate cancer 2010    Past Surgical History  Procedure Laterality Date  . Prostatectomy  2009  . Knee cartilage surgery Left 1964  . Coronary artery bypass graft  2005    CABG X4  . Carotid endarterectomy Right 2005  . Cataract extraction w/ intraocular lens  implant, bilateral Bilateral 2011  . Colonoscopy    . Cardiac catheterization  05/28/11  . Amputation  06/14/2011    Procedure: AMPUTATION DIGIT;  Surgeon: Elam Dutch, MD;  Location: [facility] OR;  Service: Vascular;  Laterality: Left;  Amputation Left fifth toe  . Amputation  06/16/2011    Procedure: AMPUTATION BELOW KNEE;  Surgeon: Elam Dutch, MD;  Location: [facility];  Service: Vascular;  Laterality: Left;  . I&d extremity  01/31/2012    Procedure: IRRIGATION AND DEBRIDEMENT EXTREMITY;  Surgeon: Elam Dutch, MD;  Location: [facility];  Service: Vascular;  Laterality: Left;  I & D Left BKA   . Amputation Right 06/12/2012    Procedure: AMPUTATION DIGIT;  Surgeon: Elam Dutch, MD;  Location: [facility];  Service: Vascular;  Laterality: Right;  GREAT TOE  . Amputation Right 08/08/2012    Procedure: AMPUTATION BELOW KNEE;  Surgeon: Elam Dutch, MD;  Location: [facility] OR;  Service: Vascular;  Laterality: Right;  . Abdominal aortagram N/A 05/28/2011    Procedure: ABDOMINAL Maxcine Ham;  Surgeon: Elam Dutch, MD;  Location: [facility] CATH LAB;  Service: Cardiovascular;  Laterality: N/A;  . Abdominal aortagram N/A 06/02/2012    Procedure: ABDOMINAL Maxcine Ham;  Surgeon: Elam Dutch, MD;  Location: [facility] CATH LAB;  Service: Cardiovascular;  Laterality: N/A;  . Abdominal aortagram N/A 06/09/2012    Procedure: ABDOMINAL Maxcine Ham;  Surgeon: Elam Dutch, MD;  Location: [facility] CATH LAB;  Service: Cardiovascular;  Laterality: N/A;  . Insertion of dialysis catheter Right 04/19/2014    Procedure: INSERTION OF DIALYSIS  CATHETER-INTERNAL JUGULAR;  Surgeon: Mal Misty, MD;  Location: [facility];  Service: Vascular;  Laterality: Right;  . Av fistula placement Right 04/19/2014    Procedure: RIGHT ARM ARTERIOVENOUS (AV) FISTULA CREATION;  Surgeon: Mal Misty, MD;  Location: [facility] OR;  Service: Vascular;  Laterality: Right;    Filed Vitals:   06/03/14 1541 06/03/14 1557  BP: 164/70   Pulse: 97 123  SpO2: 96% 97%    Visit Diagnosis:  Abnormality of gait  Impaired functional mobility and activity tolerance  Balance problems  Status post bilateral below knee amputation  Contractures of both knees      Subjective Assessment - 06/03/14 1526    Subjective On 04/17/2014 patient's heart stopped requiring CPR and was hospitalized 3 weeks with CHF & renal failure. He was placed on dialysis at that time with fistula in RUE but currently using chest port-cath. He has history of bilateral Transtibial  Amputations (left in 2013, right 2014). He recieved a left socket revision & initial right prosthesis. He presents toPT for evaluation.   Patient is accompained by: Family member   Patient Stated Goals To walk in house & access community with prostheses.   Currently in Pain? No/denies            [facility] PT Assessment - 06/03/14 1530    Assessment   Medical Diagnosis Bil. Transtibial Amputation   Onset Date/Surgical Date 04/17/14   Precautions   Precautions Fall;Other (comment)  No BP right UE   Required Braces or Orthoses --  Bil prostheses   Restrictions   Weight Bearing Restrictions No   Balance Screen   Has the patient fallen in the past 6 months No   Has the patient had a decrease in activity level because of a fear of falling?  Yes   Is the patient reluctant to leave their home because of a fear of falling?  No   Home Ecologist residence   Living Arrangements Spouse/significant other;Other relatives  adult nephew   Type of [place] Access Other (comment)   curb from parking lot to [place] One level   Prior Function   Level of Independence Independent with basic ADLs;Independent with household mobility with device;Independent with community mobility with device;Independent with community mobility without device   Posture/Postural Control   Posture/Postural Control Postural limitations   Postural Limitations Rounded Shoulders;Forward head;Flexed trunk   AROM   Overall AROM  Deficits   Overall AROM Comments hip flexors appear tight   Right Knee Extension -13   Left Knee Extension -15   Strength   Overall Strength Within functional limits for tasks performed   Transfers  Sit to Stand 5: Supervision;With upper extremity assist;With armrests;From chair/3-in-1  to RW for stabilization   Sit to Stand Details (indicate cue type and reason) demo & cues to get feet under him with standing   Stand to Sit 5: Supervision;With upper extremity assist;With armrests;To chair/3-in-1  from RW   Ambulation/Gait   Ambulation/Gait Yes   Ambulation/Gait Assistance 5: Supervision   Ambulation Distance (Feet) 50 Feet   Assistive device Prostheses;Rolling walker   Gait Pattern Step-through pattern;Decreased step length - right;Decreased step length - left;Decreased stride length;Right hip hike;Left hip hike   Ambulation Surface Indoor;Level   Dynamic Standing Balance   Dynamic Standing - Balance Support Right upper extremity supported;Left upper extremity supported;During functional activity  reaches LUE 5", RUE 3" and to floor with MinGuard   Dynamic Standing - Level of Assistance 5: Stand by assistance   Dynamic Standing - Balance Activities Eyes opn;Head nods;Head turns         Prosthetics Assessment - 06/03/14 1530    Prosthetics   Prosthetic Care Independent with Skin check;Residual limb care;Prosthetic cleaning;Proper wear schedule/adjustment   Prosthetic Care Dependent with Correct ply sock adjustment  weighing / wearing at  dialysis, donning   Donning prosthesis  Supervision   Doffing prosthesis  Modified independent (Device/Increase time)   Current prosthetic wear tolerance (days/week)  7 days   Current prosthetic wear tolerance (#hours/day)  reports all awake hours   Current prosthetic weight-bearing tolerance (hours/day)  5 minutes   Residual limb condition  intact                             PT Short Term Goals - 06/03/14 1530    PT SHORT TERM GOAL #1   Title tolerates wear >80% of awake hours /day without skin issues or pain (Target Date: 07/05/2014)   Time 1   Period Months   Status New   PT SHORT TERM GOAL #2   Title verbalizes how to weigh prostheses with dialysis correctly. (Target Date: 07/05/2014)   Time 1   Period Months   Status New   PT SHORT TERM GOAL #3   Title sit to/from stand chair with armrests to RW with supervision. (Target Date: 07/05/2014)   Time 1   Period Months   Status New   PT SHORT TERM GOAL #4   Title ambulates 35' with RW & prostheses with supervision. (Target Date: 07/05/2014)   Time 1   Period Months   Status New   PT SHORT TERM GOAL #5   Title reaches 8" with either UE using RW for support with supervision. (Target Date: 07/05/2014)   Time 1   Period Months   Status New           PT Long Term Goals - 06/03/14 1530    PT LONG TERM GOAL #1   Title independent prosthetic care correctly including wearing to dialysis. (Target Date: 08/02/2014)   Time 2   Period Months   Status New   PT LONG TERM GOAL #2   Title wears prostheses >90% of awake hours daily without skin or pain issues. (Target Date: 08/02/2014)   Time 2   Period Months   Status New   PT LONG TERM GOAL #3   Title ambulates 100' with RW & prostheses modified independent including sit to/from stand. (Target Date: 08/02/2014)   Time 2   Period Months   Status New   PT LONG TERM GOAL #  4   Title reaches 10", reaches to floor & manages clothes in standing with RW support modified  independent.  (Target Date: 08/02/2014)   Time 2   Period Months   Status New   PT LONG TERM GOAL #5   Title negotiates ramp, curb & stairs (2 rails or 2 hands on 1 rail) with rolling walker & bilateral prostheses modified independent. (Target Date: 08/02/2014)   Time 2   Period Months   Status New   Additional Long Term Goals   Additional Long Term Goals Yes   PT LONG TERM GOAL #6   Title performs above activities with Heart Rate & respirations withing safe ranges for his age. (Target Date: 08/02/2014)   Time 2   Period Months   Status New               Plan - 06-06-2014 1530    Clinical Impression Statement This patient was making excellent progress with his moblity with PT instructions then CHF / Kidney failure 04/17/2014 requiring long hospitalization and started dialysis. He needs skilled care to ambulate / balance with bilateral prostheses and maintain safe cardiac response. His heart rate increased above 80% of maxHR with limited (50') gait with RW.    Pt will benefit from skilled therapeutic intervention in order to improve on the following deficits Abnormal gait;Decreased balance;Decreased activity tolerance;Decreased endurance;Decreased knowledge of use of DME;Decreased mobility;Decreased range of motion;Decreased strength;Prosthetic Dependency   Rehab Potential Good   PT Frequency 2x / week   PT Duration Other (comment)  9 weeks (60 days)   PT Treatment/Interventions ADLs/Self Care Home Management;DME Instruction;Gait training;Stair training;Functional mobility training;Therapeutic activities;Therapeutic exercise;Balance training;Neuromuscular re-education;Prosthetic Training   PT Next Visit Plan teach how to take HR, gait & balance with RW & prostheses   PT Home Exercise Plan balance at sink   Consulted and Agree with Plan of Care Patient;Family member/caregiver   Family Member Consulted wife          G-Codes - 06/06/2014 1530    Functional Assessment Tool Used Needs  instruction in weighing at dialysis, proper adjustment of ply socks & donning,   Functional Limitation Self care   Self Care Current Status (I6962) At least 40 percent but less than 60 percent impaired, limited or restricted   Self Care Goal Status (X5284) At least 1 percent but less than 20 percent impaired, limited or restricted       Problem List Patient Active Problem List   Diagnosis Date Noted  . Anemia of chronic disease 05/02/2014  . Insomnia due to stress 05/02/2014  . Physical deconditioning 04/25/2014  . Syncope 04/17/2014  . Uncontrolled hypertension   . Chest pain 04/08/2014  . Acute on chronic renal failure 04/08/2014  . Hypertensive urgency 04/08/2014  . Status post bilateral below knee amputation 04/02/2014  . Other constipation 09/26/2013  . Cervical disc disorder with radiculopathy of cervical region 08/07/2013  . Drug-induced constipation 06/13/2013  . DM type 2, uncontrolled, with renal complications 13/24/4010  . Hypertensive renal disease 06/13/2013  . Encounter for therapeutic drug monitoring 06/13/2013  . DM type 2, uncontrolled, with neuropathy 01/23/2013  . Hyperlipidemia with target LDL less than 100 12/06/2012  . Need for prophylactic vaccination and inoculation against influenza 09/30/2012  . Diabetes mellitus with peripheral vascular disease 09/06/2012  . GERD (gastroesophageal reflux disease) 09/06/2012  . Aftercare following surgery of the circulatory system, [place] 08/03/2012  . Anemia in chronic kidney disease(285.21) 07/24/2012  . Atherosclerosis of native arteries  of the extremities with ulceration(440.23) 06/01/2012  . Hyperkalemia 04/24/2012  . Diabetic neuropathy, type II diabetes mellitus 07/27/2011  . Atherosclerosis of native arteries of the extremities with intermittent claudication 07/08/2011  . S/P BKA (below knee amputation) 06/22/2011  . Atherosclerosis of native arteries of the extremities with gangrene 06/10/2011  . CKD (chronic  kidney disease) stage 4, GFR 15-29 ml/min 05/02/2011  . HTN (hypertension) 04/28/2011  . CAD (coronary artery disease) 04/28/2011  . PAD (peripheral artery disease) 04/28/2011  . Prostate cancer 04/28/2011    Jamey Reas PT, DPT 06/04/2014, 12:06 PM  [facility] [address] [place], [place], [address] Phone: [phone]   Fax:  [phone]

## 2014-06-05 ENCOUNTER — Encounter: Payer: Medicare Other | Admitting: Physical Therapy

## 2014-06-06 DIAGNOSIS — D631 Anemia in chronic kidney disease: Secondary | ICD-10-CM | POA: Diagnosis not present

## 2014-06-06 DIAGNOSIS — E119 Type 2 diabetes mellitus without complications: Secondary | ICD-10-CM | POA: Diagnosis not present

## 2014-06-06 DIAGNOSIS — E214 Other specified disorders of parathyroid gland: Secondary | ICD-10-CM | POA: Diagnosis not present

## 2014-06-06 DIAGNOSIS — D509 Iron deficiency anemia, unspecified: Secondary | ICD-10-CM | POA: Diagnosis not present

## 2014-06-06 DIAGNOSIS — N186 End stage renal disease: Secondary | ICD-10-CM | POA: Diagnosis not present

## 2014-06-06 DIAGNOSIS — N2581 Secondary hyperparathyroidism of renal origin: Secondary | ICD-10-CM | POA: Diagnosis not present

## 2014-06-07 ENCOUNTER — Encounter: Payer: Self-pay | Admitting: Vascular Surgery

## 2014-06-08 DIAGNOSIS — D631 Anemia in chronic kidney disease: Secondary | ICD-10-CM | POA: Diagnosis not present

## 2014-06-08 DIAGNOSIS — E214 Other specified disorders of parathyroid gland: Secondary | ICD-10-CM | POA: Diagnosis not present

## 2014-06-08 DIAGNOSIS — E119 Type 2 diabetes mellitus without complications: Secondary | ICD-10-CM | POA: Diagnosis not present

## 2014-06-08 DIAGNOSIS — D509 Iron deficiency anemia, unspecified: Secondary | ICD-10-CM | POA: Diagnosis not present

## 2014-06-08 DIAGNOSIS — N186 End stage renal disease: Secondary | ICD-10-CM | POA: Diagnosis not present

## 2014-06-08 DIAGNOSIS — N2581 Secondary hyperparathyroidism of renal origin: Secondary | ICD-10-CM | POA: Diagnosis not present

## 2014-06-11 ENCOUNTER — Telehealth: Payer: Self-pay

## 2014-06-11 ENCOUNTER — Encounter: Payer: Self-pay | Admitting: Vascular Surgery

## 2014-06-11 ENCOUNTER — Ambulatory Visit (INDEPENDENT_AMBULATORY_CARE_PROVIDER_SITE_OTHER): Payer: Medicare Other | Admitting: Vascular Surgery

## 2014-06-11 ENCOUNTER — Ambulatory Visit (HOSPITAL_COMMUNITY)
Admission: RE | Admit: 2014-06-11 | Discharge: 2014-06-11 | Disposition: A | Discharge status: Home / Self Care | Payer: Medicare Other | Source: Ambulatory Visit | Attending: Vascular Surgery | Admitting: Vascular Surgery

## 2014-06-11 VITALS — BP 160/84 | HR 96 | Temp 99.3°F | Resp 16 | Ht 68.0 in | Wt 205.0 lb

## 2014-06-11 DIAGNOSIS — N2581 Secondary hyperparathyroidism of renal origin: Secondary | ICD-10-CM | POA: Diagnosis not present

## 2014-06-11 DIAGNOSIS — E119 Type 2 diabetes mellitus without complications: Secondary | ICD-10-CM | POA: Diagnosis not present

## 2014-06-11 DIAGNOSIS — N186 End stage renal disease: Secondary | ICD-10-CM | POA: Insufficient documentation

## 2014-06-11 DIAGNOSIS — Z992 Dependence on renal dialysis: Secondary | ICD-10-CM | POA: Diagnosis not present

## 2014-06-11 DIAGNOSIS — Z4931 Encounter for adequacy testing for hemodialysis: Secondary | ICD-10-CM | POA: Diagnosis not present

## 2014-06-11 DIAGNOSIS — D509 Iron deficiency anemia, unspecified: Secondary | ICD-10-CM | POA: Diagnosis not present

## 2014-06-11 DIAGNOSIS — D631 Anemia in chronic kidney disease: Secondary | ICD-10-CM | POA: Diagnosis not present

## 2014-06-11 DIAGNOSIS — E214 Other specified disorders of parathyroid gland: Secondary | ICD-10-CM | POA: Diagnosis not present

## 2014-06-11 DIAGNOSIS — E1122 Type 2 diabetes mellitus with diabetic chronic kidney disease: Secondary | ICD-10-CM | POA: Diagnosis not present

## 2014-06-11 NOTE — Progress Notes (Signed)
Subjective:     Patient ID: Aaron Long, male   DOB: [birth year], 74 y.o.   MRN: [id number]  HPI this 74 year old male returns for follow-up regarding his right radial-cephalic AV fistula created 04/19/2014. Patient has had some mild numbness in the right hand. He is currently on dialysis through a tunneled catheter in the right IJ. He dialyzes Tuesday Thursday Saturday.  Past Medical History  Diagnosis Date  . PAD (peripheral artery disease)     a. R CEA 2007. b. Hx L BKA in 2013. c. s/p LE angioplasty in 2014. d. Hx R BKA in 2014.  . Gangrene of toe     dry  . Neuromuscular disorder     diabetic neuropathy  . Hypertension   . Hyperlipidemia   . Peripheral neuropathy   . Constipation     takes Miralax daily  . Hemorrhoids   . Hx of colonic polyps   . Coronary artery disease     a. s/p NSTEMI/CABGx4 in 2007 - LIMA-LAD, SVG-optional diagonal, SVVG-OM, SVG-dRCA. b. Nuc 05/2011: nonischemic.  [name] BPH (benign prostatic hypertrophy)   . Abnormality of gait   . DVT (deep venous thrombosis)     a. Lower extremity DVT in 2013.  [name] Hypertrophy of prostate without urinary obstruction and other lower urinary tract symptoms (LUTS)   . Disorder of bone and cartilage, unspecified   . Lower limb amputation, below knee   . Anemia   . Secondary hyperparathyroidism     Secondary Hyperpara- Thyroidism, Renal  . Ischemic cardiomyopathy     a. EF 40% in 2007. b. 51% by nuc 05/2011.  [name] Complication of anesthesia     "he gets delirious"  . Myocardial infarction 2005  . Type II diabetes mellitus   . Diabetic peripheral neuropathy   . ESRD (end stage renal disease)     "not ready yet for dialysis" (04/17/2014)  . Kidney stones   . Prostate cancer 2010    History  Substance Use Topics  . Smoking status: Former Smoker -- 1.00 packs/day for 30 years    Types: Cigarettes    Quit date: 04/28/1991  . Smokeless tobacco: Never Used  . Alcohol Use: Yes     Comment: 04/17/2014 "might have a glass of wine  at Christmas"    Family History  Problem Relation Age of Onset  . Coronary artery disease Neg Hx   . Anesthesia problems Neg Hx   . Hypotension Neg Hx   . Malignant hyperthermia Neg Hx   . Pseudochol deficiency Neg Hx   . Hyperlipidemia Mother   . Hypertension Mother   . Cancer Mother   . Hyperlipidemia Father   . Hypertension Father   . Kidney disease Father   . Heart disease Sister   . Alzheimer's disease Sister   . Cancer Brother   . Other Sister     Allergies  Allergen Reactions  . Oxycodone Other (See Comments)    Hallucinations     Current outpatient prescriptions:  .  AMBULATORY NON FORMULARY MEDICATION, BD U/P Mini Pen [place] 31GX5MM Use three times daily as directed DX:250.00, Disp: 100 each, Rfl: 11 .  AMBULATORY NON FORMULARY MEDICATION, One Touch Ultra 2 Test Strips Sig:  Check blood sugars Three times daily to keep blood sugars under control Dx: 250.00, 401.9, Disp: 100 strip, Rfl: 11 .  amLODipine (NORVASC) 10 MG tablet, Take 1 tablet (10 mg total) by mouth daily., Disp: 30 tablet, Rfl: 1 .  aspirin EC 81  MG tablet, Take 1 tablet (81 mg total) by mouth every morning., Disp: , Rfl:  .  calcium acetate (PHOSLO) 667 MG capsule, Take 1 capsule (667 mg total) by mouth 3 (three) times daily with meals., Disp: 90 capsule, Rfl: 1 .  clopidogrel (PLAVIX) 75 MG tablet, take 1 tablet by mouth once daily, Disp: 90 tablet, Rfl: 1 .  doxazosin (CARDURA) 1 MG tablet, take 1 tablet by mouth at bedtime, Disp: 90 tablet, Rfl: 1 .  famotidine (PEPCID) 20 MG tablet, Take 1 tablet (20 mg total) by mouth at bedtime., Disp: 30 tablet, Rfl: 1 .  hydrALAZINE (APRESOLINE) 100 MG tablet, Take 100 mg by mouth every 8 (eight) hours., Disp: , Rfl:  .  HYDROcodone-acetaminophen (NORCO/VICODIN) 5-325 MG per tablet, Take 1 tablet by mouth every 12 hours as needed for pain, Disp: 60 tablet, Rfl: 0 .  insulin aspart (NOVOLOG) 100 UNIT/ML FlexPen, Inject 5 Units into the skin 3 (three) times daily  with meals. Inject 5 units three times a day with meals if blood sugar greater than 150 only, DX: 250.71, Disp: , Rfl:  .  Insulin Detemir (LEVEMIR) 100 UNIT/ML Pen, Inject 15 Units into the skin 2 (two) times daily., Disp: 15 mL, Rfl: 3 .  Misc. Devices [facility]) MISC, Patient needs new Wheelchair due to his being broken., Disp: 1 each, Rfl: 0 .  multivitamin (RENA-VIT) TABS tablet, Take 1 tablet by mouth at bedtime., Disp: 30 tablet, Rfl: 1 .  pravastatin (PRAVACHOL) 20 MG tablet, Take 1 tablet (20 mg total) by mouth daily., Disp: 90 tablet, Rfl: 3 .  pregabalin (LYRICA) 75 MG capsule, Take 1 capsule (75 mg total) by mouth daily., Disp: 30 capsule, Rfl: 0 .  QUEtiapine (SEROQUEL) 25 MG tablet, Take 1 tablet (25 mg total) by mouth daily., Disp: 30 tablet, Rfl: 1 .  senna-docusate (SENOKOT-S) 8.6-50 MG per tablet, Take 2 tablets by mouth 2 (two) times daily., Disp: 60 tablet, Rfl: 0  Filed Vitals:   06/11/14 1110  BP: 160/84  Pulse: 96  Temp: 99.3 F (37.4 C)  TempSrc: Oral  Resp: 16  Height: 5\' 8"  (1.727 m)  Weight: 205 lb (92.987 kg)  SpO2: 98%    Body mass index is 31.18 kg/(m^2).           Review of Systems     Objective:   Physical Exam BP 160/84 mmHg  Pulse 96  Temp(Src) 99.3 F (37.4 C) (Oral)  Resp 16  Ht 5\' 8"  (1.727 m)  Wt 205 lb (92.987 kg)  BMI 31.18 kg/m2  SpO2 98%  Gen. well-developed well-nourished male in no apparent distress alert and oriented 3-in a wheelchair-bilateral amputee Lungs no rhonchi or wheezing Cardiovascular regular rhythm no murmurs Abdomen soft nontender no masses Right upper extremity with good pulse and thrill and radial-cephalic AV fistula. One prominent branch is noted in the distal forearm. Flow does improve in the fistula with compression of this branch. Right hand appears adequately perfused. Today I ordered a duplex scan of the fistula which reveals one or 2 competing branches in the mid to distal forearm of the right  arm AV fistula.     Assessment:     End-stage renal disease with right radial-cephalic AV fistula with competing branch right forearm    Plan:     Plan ligation of competing branch or branches right radial-cephalic AV fistula on Wednesday, June 8 as an outpatient discussed with patient and his family and they agreed to proceed If  patient develops increasing pain or numbness and right hand may have to consider ligation of fistula in the future although today he is tolerating that well

## 2014-06-11 NOTE — Telephone Encounter (Signed)
Message left on triage Vm, patients wife would like to know when she can pick-up rx for patient.  I left message informing patients wife rx is not due to 06/12/13 (Thursday). Notation was made in the Triage Assistant to print rx on Thursday

## 2014-06-13 ENCOUNTER — Other Ambulatory Visit: Payer: Self-pay | Admitting: *Deleted

## 2014-06-13 DIAGNOSIS — E119 Type 2 diabetes mellitus without complications: Secondary | ICD-10-CM | POA: Diagnosis not present

## 2014-06-13 DIAGNOSIS — D509 Iron deficiency anemia, unspecified: Secondary | ICD-10-CM | POA: Diagnosis not present

## 2014-06-13 DIAGNOSIS — D631 Anemia in chronic kidney disease: Secondary | ICD-10-CM | POA: Diagnosis not present

## 2014-06-13 DIAGNOSIS — E214 Other specified disorders of parathyroid gland: Secondary | ICD-10-CM | POA: Diagnosis not present

## 2014-06-13 DIAGNOSIS — N186 End stage renal disease: Secondary | ICD-10-CM | POA: Diagnosis not present

## 2014-06-13 DIAGNOSIS — Z89511 Acquired absence of right leg below knee: Secondary | ICD-10-CM

## 2014-06-13 DIAGNOSIS — Z89512 Acquired absence of left leg below knee: Principal | ICD-10-CM

## 2014-06-13 MED ORDER — HYDROCODONE-ACETAMINOPHEN 5-325 MG PO TABS: ORAL_TABLET | ORAL | Status: DC

## 2014-06-13 NOTE — Telephone Encounter (Signed)
Patient requested and will pick up 

## 2014-06-14 ENCOUNTER — Other Ambulatory Visit: Payer: Self-pay

## 2014-06-15 DIAGNOSIS — E119 Type 2 diabetes mellitus without complications: Secondary | ICD-10-CM | POA: Diagnosis not present

## 2014-06-15 DIAGNOSIS — D509 Iron deficiency anemia, unspecified: Secondary | ICD-10-CM | POA: Diagnosis not present

## 2014-06-15 DIAGNOSIS — E214 Other specified disorders of parathyroid gland: Secondary | ICD-10-CM | POA: Diagnosis not present

## 2014-06-15 DIAGNOSIS — D631 Anemia in chronic kidney disease: Secondary | ICD-10-CM | POA: Diagnosis not present

## 2014-06-15 DIAGNOSIS — N186 End stage renal disease: Secondary | ICD-10-CM | POA: Diagnosis not present

## 2014-06-18 DIAGNOSIS — D631 Anemia in chronic kidney disease: Secondary | ICD-10-CM | POA: Diagnosis not present

## 2014-06-18 DIAGNOSIS — E214 Other specified disorders of parathyroid gland: Secondary | ICD-10-CM | POA: Diagnosis not present

## 2014-06-18 DIAGNOSIS — N186 End stage renal disease: Secondary | ICD-10-CM | POA: Diagnosis not present

## 2014-06-18 DIAGNOSIS — E119 Type 2 diabetes mellitus without complications: Secondary | ICD-10-CM | POA: Diagnosis not present

## 2014-06-18 DIAGNOSIS — D509 Iron deficiency anemia, unspecified: Secondary | ICD-10-CM | POA: Diagnosis not present

## 2014-06-18 MED ORDER — SODIUM CHLORIDE 0.9 % IV SOLN
INTRAVENOUS | Status: DC
  Administered 2014-06-19: 09:00:00 via INTRAVENOUS

## 2014-06-18 MED ORDER — CHLORHEXIDINE GLUCONATE CLOTH 2 % EX PADS: 6.0000 | MEDICATED_PAD | Freq: Once | CUTANEOUS | Status: DC

## 2014-06-18 MED ORDER — DEXTROSE 5 % IV SOLN
1.5000 g | INTRAVENOUS | Status: AC
  Administered 2014-06-19: 1.5 g via INTRAVENOUS
  Filled 2014-06-18: qty 1.5

## 2014-06-18 NOTE — Progress Notes (Signed)
Pt gave consent for wife, Mrs. Nicholos Johns to complete assessment. Spouse made aware to not administer any diabetic medication the morning of procedure. According to spouse, pt last dose of Plavix was 06/11/14. Spouse made aware to stop any NSAIDS, otc vitamins and herbal medication. Mrs. Maya verbalized understanding of all pre-op instructions.

## 2014-06-19 ENCOUNTER — Ambulatory Visit (HOSPITAL_COMMUNITY)
Admission: RE | Admit: 2014-06-19 | Discharge: 2014-06-19 | Disposition: A | Discharge status: Home / Self Care | Payer: Medicare Other | Source: Ambulatory Visit | Attending: Vascular Surgery | Admitting: Vascular Surgery

## 2014-06-19 ENCOUNTER — Ambulatory Visit (HOSPITAL_COMMUNITY): Payer: Medicare Other | Admitting: Certified Registered"

## 2014-06-19 ENCOUNTER — Encounter (HOSPITAL_COMMUNITY)
Admission: RE | Disposition: A | Discharge status: Home / Self Care | Payer: Self-pay | Source: Ambulatory Visit | Attending: Vascular Surgery

## 2014-06-19 ENCOUNTER — Encounter (HOSPITAL_COMMUNITY): Payer: Self-pay | Admitting: *Deleted

## 2014-06-19 DIAGNOSIS — E785 Hyperlipidemia, unspecified: Secondary | ICD-10-CM | POA: Diagnosis not present

## 2014-06-19 DIAGNOSIS — Z87891 Personal history of nicotine dependence: Secondary | ICD-10-CM | POA: Diagnosis not present

## 2014-06-19 DIAGNOSIS — E669 Obesity, unspecified: Secondary | ICD-10-CM | POA: Insufficient documentation

## 2014-06-19 DIAGNOSIS — Y832 Surgical operation with anastomosis, bypass or graft as the cause of abnormal reaction of the patient, or of later complication, without mention of misadventure at the time of the procedure: Secondary | ICD-10-CM | POA: Insufficient documentation

## 2014-06-19 DIAGNOSIS — I120 Hypertensive chronic kidney disease with stage 5 chronic kidney disease or end stage renal disease: Secondary | ICD-10-CM | POA: Diagnosis not present

## 2014-06-19 DIAGNOSIS — Z89511 Acquired absence of right leg below knee: Secondary | ICD-10-CM

## 2014-06-19 DIAGNOSIS — Z79891 Long term (current) use of opiate analgesic: Secondary | ICD-10-CM | POA: Insufficient documentation

## 2014-06-19 DIAGNOSIS — T82590A Other mechanical complication of surgically created arteriovenous fistula, initial encounter: Secondary | ICD-10-CM | POA: Insufficient documentation

## 2014-06-19 DIAGNOSIS — K219 Gastro-esophageal reflux disease without esophagitis: Secondary | ICD-10-CM | POA: Diagnosis not present

## 2014-06-19 DIAGNOSIS — Y929 Unspecified place or not applicable: Secondary | ICD-10-CM | POA: Diagnosis not present

## 2014-06-19 DIAGNOSIS — E1142 Type 2 diabetes mellitus with diabetic polyneuropathy: Secondary | ICD-10-CM | POA: Diagnosis not present

## 2014-06-19 DIAGNOSIS — N186 End stage renal disease: Secondary | ICD-10-CM | POA: Diagnosis not present

## 2014-06-19 DIAGNOSIS — N400 Enlarged prostate without lower urinary tract symptoms: Secondary | ICD-10-CM | POA: Diagnosis not present

## 2014-06-19 DIAGNOSIS — I252 Old myocardial infarction: Secondary | ICD-10-CM | POA: Insufficient documentation

## 2014-06-19 DIAGNOSIS — Z7982 Long term (current) use of aspirin: Secondary | ICD-10-CM | POA: Insufficient documentation

## 2014-06-19 DIAGNOSIS — K5900 Constipation, unspecified: Secondary | ICD-10-CM | POA: Diagnosis not present

## 2014-06-19 DIAGNOSIS — Z6831 Body mass index (BMI) 31.0-31.9, adult: Secondary | ICD-10-CM | POA: Insufficient documentation

## 2014-06-19 DIAGNOSIS — I770 Arteriovenous fistula, acquired: Secondary | ICD-10-CM | POA: Diagnosis not present

## 2014-06-19 DIAGNOSIS — I739 Peripheral vascular disease, unspecified: Secondary | ICD-10-CM | POA: Diagnosis not present

## 2014-06-19 DIAGNOSIS — Z86718 Personal history of other venous thrombosis and embolism: Secondary | ICD-10-CM | POA: Insufficient documentation

## 2014-06-19 DIAGNOSIS — Z89512 Acquired absence of left leg below knee: Secondary | ICD-10-CM

## 2014-06-19 DIAGNOSIS — Z79899 Other long term (current) drug therapy: Secondary | ICD-10-CM | POA: Diagnosis not present

## 2014-06-19 DIAGNOSIS — Z7902 Long term (current) use of antithrombotics/antiplatelets: Secondary | ICD-10-CM | POA: Insufficient documentation

## 2014-06-19 DIAGNOSIS — Z794 Long term (current) use of insulin: Secondary | ICD-10-CM | POA: Diagnosis not present

## 2014-06-19 DIAGNOSIS — Z8546 Personal history of malignant neoplasm of prostate: Secondary | ICD-10-CM | POA: Diagnosis not present

## 2014-06-19 DIAGNOSIS — T82898A Other specified complication of vascular prosthetic devices, implants and grafts, initial encounter: Secondary | ICD-10-CM

## 2014-06-19 DIAGNOSIS — I2510 Atherosclerotic heart disease of native coronary artery without angina pectoris: Secondary | ICD-10-CM | POA: Diagnosis not present

## 2014-06-19 HISTORY — PX: LIGATION OF COMPETING BRANCHES OF ARTERIOVENOUS FISTULA: SHX5949

## 2014-06-19 LAB — POCT I-STAT 4, (NA,K, GLUC, HGB,HCT)
Glucose, Bld: 216 mg/dL — ABNORMAL HIGH (ref 65–99)
HCT: 45 % (ref 39.0–52.0)
Hemoglobin: 15.3 g/dL (ref 13.0–17.0)
POTASSIUM: 4.5 mmol/L (ref 3.5–5.1)
SODIUM: 137 mmol/L (ref 135–145)

## 2014-06-19 LAB — GLUCOSE, CAPILLARY
GLUCOSE-CAPILLARY: 190 mg/dL — AB (ref 65–99)
GLUCOSE-CAPILLARY: 192 mg/dL — AB (ref 65–99)
Glucose-Capillary: 188 mg/dL — ABNORMAL HIGH (ref 65–99)

## 2014-06-19 SURGERY — LIGATION OF COMPETING BRANCHES OF ARTERIOVENOUS FISTULA
Anesthesia: Monitor Anesthesia Care | Site: Arm Lower | Laterality: Right

## 2014-06-19 MED ORDER — SODIUM CHLORIDE 0.9 % IJ SOLN
INTRAMUSCULAR | Status: AC
  Filled 2014-06-19: qty 20

## 2014-06-19 MED ORDER — FENTANYL CITRATE (PF) 100 MCG/2ML IJ SOLN: 25.0000 ug | INTRAMUSCULAR | Status: DC | PRN

## 2014-06-19 MED ORDER — LIDOCAINE-EPINEPHRINE (PF) 1 %-1:200000 IJ SOLN
INTRAMUSCULAR | Status: DC | PRN
  Administered 2014-06-19: 9 mL

## 2014-06-19 MED ORDER — HYDROCODONE-ACETAMINOPHEN 5-325 MG PO TABS: ORAL_TABLET | ORAL | Status: DC

## 2014-06-19 MED ORDER — EPHEDRINE SULFATE 50 MG/ML IJ SOLN
INTRAMUSCULAR | Status: AC
  Filled 2014-06-19: qty 2

## 2014-06-19 MED ORDER — FENTANYL CITRATE (PF) 250 MCG/5ML IJ SOLN
INTRAMUSCULAR | Status: AC
  Filled 2014-06-19: qty 5

## 2014-06-19 MED ORDER — ONDANSETRON HCL 4 MG/2ML IJ SOLN: 4.0000 mg | Freq: Four times a day (QID) | INTRAMUSCULAR | Status: DC | PRN

## 2014-06-19 MED ORDER — PROPOFOL 10 MG/ML IV BOLUS
INTRAVENOUS | Status: AC
  Filled 2014-06-19: qty 20

## 2014-06-19 MED ORDER — FENTANYL CITRATE (PF) 100 MCG/2ML IJ SOLN
INTRAMUSCULAR | Status: DC | PRN
  Administered 2014-06-19: 50 ug via INTRAVENOUS

## 2014-06-19 MED ORDER — LIDOCAINE HCL (CARDIAC) 20 MG/ML IV SOLN
INTRAVENOUS | Status: AC
  Filled 2014-06-19: qty 5

## 2014-06-19 MED ORDER — PHENYLEPHRINE 40 MCG/ML (10ML) SYRINGE FOR IV PUSH (FOR BLOOD PRESSURE SUPPORT)
PREFILLED_SYRINGE | INTRAVENOUS | Status: AC
  Filled 2014-06-19: qty 10

## 2014-06-19 MED ORDER — 0.9 % SODIUM CHLORIDE (POUR BTL) OPTIME
TOPICAL | Status: DC | PRN
Start: 2014-06-19 — End: 2014-06-19
  Administered 2014-06-19: 1000 mL

## 2014-06-19 MED ORDER — LIDOCAINE-EPINEPHRINE (PF) 1 %-1:200000 IJ SOLN
INTRAMUSCULAR | Status: AC
  Filled 2014-06-19: qty 10

## 2014-06-19 MED ORDER — PROPOFOL INFUSION 10 MG/ML OPTIME
INTRAVENOUS | Status: DC | PRN
  Administered 2014-06-19: 75 ug/kg/min via INTRAVENOUS

## 2014-06-19 MED ORDER — SUCCINYLCHOLINE CHLORIDE 20 MG/ML IJ SOLN
INTRAMUSCULAR | Status: AC
Start: 2014-06-19 — End: 2014-06-19
  Filled 2014-06-19: qty 1

## 2014-06-19 MED ORDER — SODIUM CHLORIDE 0.9 % IR SOLN
Status: DC | PRN
  Administered 2014-06-19: 500 mL

## 2014-06-19 MED ORDER — PHENYLEPHRINE HCL 10 MG/ML IJ SOLN
INTRAMUSCULAR | Status: DC | PRN
  Administered 2014-06-19 (×4): 80 ug via INTRAVENOUS

## 2014-06-19 MED ORDER — SODIUM CHLORIDE 0.9 % IV SOLN
INTRAVENOUS | Status: DC | PRN
  Administered 2014-06-19: 10:00:00 via INTRAVENOUS

## 2014-06-19 MED ORDER — ALBUTEROL SULFATE HFA 108 (90 BASE) MCG/ACT IN AERS
INHALATION_SPRAY | RESPIRATORY_TRACT | Status: AC
  Filled 2014-06-19: qty 13.4

## 2014-06-19 MED ORDER — LIDOCAINE HCL (CARDIAC) 20 MG/ML IV SOLN
INTRAVENOUS | Status: DC | PRN
  Administered 2014-06-19: 20 mg via INTRAVENOUS

## 2014-06-19 SURGICAL SUPPLY — 31 items
CANISTER SUCTION 2500CC (MISCELLANEOUS) ×3 IMPLANT
CLIP TI MEDIUM 6 (CLIP) IMPLANT
CLIP TI WIDE RED SMALL 6 (CLIP) IMPLANT
ELECT REM PT RETURN 9FT ADLT (ELECTROSURGICAL) ×3
ELECTRODE REM PT RTRN 9FT ADLT (ELECTROSURGICAL) ×1 IMPLANT
GAUZE SPONGE 4X4 12PLY STRL (GAUZE/BANDAGES/DRESSINGS) ×3 IMPLANT
GEL ULTRASOUND 20GR AQUASONIC (MISCELLANEOUS) IMPLANT
GLOVE BIO SURGEON STRL SZ 6.5 (GLOVE) ×2 IMPLANT
GLOVE BIO SURGEONS STRL SZ 6.5 (GLOVE) ×2
GLOVE BIOGEL PI IND STRL 6.5 (GLOVE) IMPLANT
GLOVE BIOGEL PI INDICATOR 6.5 (GLOVE) ×4
GLOVE SKINSENSE NS SZ7.0 (GLOVE) ×2
GLOVE SKINSENSE STRL SZ7.0 (GLOVE) IMPLANT
GLOVE SS BIOGEL STRL SZ 7 (GLOVE) ×1 IMPLANT
GLOVE SUPERSENSE BIOGEL SZ 7 (GLOVE) ×2
GOWN STRL REUS W/ TWL LRG LVL3 (GOWN DISPOSABLE) ×3 IMPLANT
GOWN STRL REUS W/TWL LRG LVL3 (GOWN DISPOSABLE) ×9
KIT BASIN OR (CUSTOM PROCEDURE TRAY) ×3 IMPLANT
KIT ROOM TURNOVER OR (KITS) ×3 IMPLANT
LIQUID BAND (GAUZE/BANDAGES/DRESSINGS) ×3 IMPLANT
NS IRRIG 1000ML POUR BTL (IV SOLUTION) ×3 IMPLANT
PACK CV ACCESS (CUSTOM PROCEDURE TRAY) ×3 IMPLANT
PAD ARMBOARD 7.5X6 YLW CONV (MISCELLANEOUS) ×6 IMPLANT
SUT PROLENE 6 0 BV (SUTURE) IMPLANT
SUT SILK 0 TIES 10X30 (SUTURE) ×3 IMPLANT
SUT VIC AB 3-0 SH 27 (SUTURE) ×3
SUT VIC AB 3-0 SH 27X BRD (SUTURE) ×1 IMPLANT
SWAB COLLECTION DEVICE MRSA (MISCELLANEOUS) IMPLANT
TUBE ANAEROBIC SPECIMEN COL (MISCELLANEOUS) IMPLANT
UNDERPAD 30X30 INCONTINENT (UNDERPADS AND DIAPERS) ×3 IMPLANT
WATER STERILE IRR 1000ML POUR (IV SOLUTION) ×3 IMPLANT

## 2014-06-19 NOTE — Anesthesia Preprocedure Evaluation (Addendum)
Anesthesia Evaluation  Patient identified by MRN, date of birth, ID band Patient awake    Reviewed: Allergy & Precautions, NPO status , Patient's Chart, lab work & pertinent test results  Airway Mallampati: II  TM Distance: >3 FB Neck ROM: full    Dental  (+) Edentulous Upper, Dental Advisory Given   Pulmonary former smoker,  breath sounds clear to auscultation        Cardiovascular hypertension, + CAD, + Past MI and + Peripheral Vascular Disease Rhythm:regular Rate:Normal     Neuro/Psych  Neuromuscular disease    GI/Hepatic GERD-  ,  Endo/Other  diabetes, Type 2obese  Renal/GU ESRF and DialysisRenal disease     Musculoskeletal   Abdominal   Peds  Hematology   Anesthesia Other Findings   Reproductive/Obstetrics                            Anesthesia Physical Anesthesia Plan  ASA: III  Anesthesia Plan: MAC   Post-op Pain Management:    Induction: Intravenous  Airway Management Planned: Simple Face Mask  Additional Equipment:   Intra-op Plan:   Post-operative Plan:   Informed Consent: I have reviewed the patients History and Physical, chart, labs and discussed the procedure including the risks, benefits and alternatives for the proposed anesthesia with the patient or authorized representative who has indicated his/her understanding and acceptance.     Plan Discussed with: CRNA, Anesthesiologist and Surgeon  Anesthesia Plan Comments:         Anesthesia Quick Evaluation

## 2014-06-19 NOTE — H&P (View-Only) (Signed)
Subjective:     Patient ID: Aaron Long, male   DOB: 17-Apr-1940, 74 y.o.   MRN: [id number]  HPI this 74 year old male returns for follow-up regarding his right radial-cephalic AV fistula created 04/19/2014. Patient has had some mild numbness in the right hand. He is currently on dialysis through a tunneled catheter in the right IJ. He dialyzes Tuesday Thursday Saturday.  Past Medical History  Diagnosis Date  . PAD (peripheral artery disease)     a. R CEA 2007. b. Hx L BKA in 2013. c. s/p LE angioplasty in 2014. d. Hx R BKA in 2014.  . Gangrene of toe     dry  . Neuromuscular disorder     diabetic neuropathy  . Hypertension   . Hyperlipidemia   . Peripheral neuropathy   . Constipation     takes Miralax daily  . Hemorrhoids   . Hx of colonic polyps   . Coronary artery disease     a. s/p NSTEMI/CABGx4 in 2007 - LIMA-LAD, SVG-optional diagonal, SVVG-OM, SVG-dRCA. b. Nuc 05/2011: nonischemic.  [name] BPH (benign prostatic hypertrophy)   . Abnormality of gait   . DVT (deep venous thrombosis)     a. Lower extremity DVT in 2013.  [name] Hypertrophy of prostate without urinary obstruction and other lower urinary tract symptoms (LUTS)   . Disorder of bone and cartilage, unspecified   . Lower limb amputation, below knee   . Anemia   . Secondary hyperparathyroidism     Secondary Hyperpara- Thyroidism, Renal  . Ischemic cardiomyopathy     a. EF 40% in 2007. b. 51% by nuc 05/2011.  [name] Complication of anesthesia     "he gets delirious"  . Myocardial infarction 2005  . Type II diabetes mellitus   . Diabetic peripheral neuropathy   . ESRD (end stage renal disease)     "not ready yet for dialysis" (04/17/2014)  . Kidney stones   . Prostate cancer 2010    History  Substance Use Topics  . Smoking status: Former Smoker -- 1.00 packs/day for 30 years    Types: Cigarettes    Quit date: 04/28/1991  . Smokeless tobacco: Never Used  . Alcohol Use: Yes     Comment: 04/17/2014 "might have a glass of wine  at Christmas"    Family History  Problem Relation Age of Onset  . Coronary artery disease Neg Hx   . Anesthesia problems Neg Hx   . Hypotension Neg Hx   . Malignant hyperthermia Neg Hx   . Pseudochol deficiency Neg Hx   . Hyperlipidemia Mother   . Hypertension Mother   . Cancer Mother   . Hyperlipidemia Father   . Hypertension Father   . Kidney disease Father   . Heart disease Sister   . Alzheimer's disease Sister   . Cancer Brother   . Other Sister     Allergies  Allergen Reactions  . Oxycodone Other (See Comments)    Hallucinations     Current outpatient prescriptions:  .  AMBULATORY NON FORMULARY MEDICATION, BD U/P Mini Pen [place] 31GX5MM Use three times daily as directed DX:250.00, Disp: 100 each, Rfl: 11 .  AMBULATORY NON FORMULARY MEDICATION, One Touch Ultra 2 Test Strips Sig:  Check blood sugars Three times daily to keep blood sugars under control Dx: 250.00, 401.9, Disp: 100 strip, Rfl: 11 .  amLODipine (NORVASC) 10 MG tablet, Take 1 tablet (10 mg total) by mouth daily., Disp: 30 tablet, Rfl: 1 .  aspirin EC 81  MG tablet, Take 1 tablet (81 mg total) by mouth every morning., Disp: , Rfl:  .  calcium acetate (PHOSLO) 667 MG capsule, Take 1 capsule (667 mg total) by mouth 3 (three) times daily with meals., Disp: 90 capsule, Rfl: 1 .  clopidogrel (PLAVIX) 75 MG tablet, take 1 tablet by mouth once daily, Disp: 90 tablet, Rfl: 1 .  doxazosin (CARDURA) 1 MG tablet, take 1 tablet by mouth at bedtime, Disp: 90 tablet, Rfl: 1 .  famotidine (PEPCID) 20 MG tablet, Take 1 tablet (20 mg total) by mouth at bedtime., Disp: 30 tablet, Rfl: 1 .  hydrALAZINE (APRESOLINE) 100 MG tablet, Take 100 mg by mouth every 8 (eight) hours., Disp: , Rfl:  .  HYDROcodone-acetaminophen (NORCO/VICODIN) 5-325 MG per tablet, Take 1 tablet by mouth every 12 hours as needed for pain, Disp: 60 tablet, Rfl: 0 .  insulin aspart (NOVOLOG) 100 UNIT/ML FlexPen, Inject 5 Units into the skin 3 (three) times daily  with meals. Inject 5 units three times a day with meals if blood sugar greater than 150 only, DX: 250.71, Disp: , Rfl:  .  Insulin Detemir (LEVEMIR) 100 UNIT/ML Pen, Inject 15 Units into the skin 2 (two) times daily., Disp: 15 mL, Rfl: 3 .  Misc. Devices [facility]) MISC, Patient needs new Wheelchair due to his being broken., Disp: 1 each, Rfl: 0 .  multivitamin (RENA-VIT) TABS tablet, Take 1 tablet by mouth at bedtime., Disp: 30 tablet, Rfl: 1 .  pravastatin (PRAVACHOL) 20 MG tablet, Take 1 tablet (20 mg total) by mouth daily., Disp: 90 tablet, Rfl: 3 .  pregabalin (LYRICA) 75 MG capsule, Take 1 capsule (75 mg total) by mouth daily., Disp: 30 capsule, Rfl: 0 .  QUEtiapine (SEROQUEL) 25 MG tablet, Take 1 tablet (25 mg total) by mouth daily., Disp: 30 tablet, Rfl: 1 .  senna-docusate (SENOKOT-S) 8.6-50 MG per tablet, Take 2 tablets by mouth 2 (two) times daily., Disp: 60 tablet, Rfl: 0  Filed Vitals:   06/11/14 1110  BP: 160/84  Pulse: 96  Temp: 99.3 F (37.4 C)  TempSrc: Oral  Resp: 16  Height: 5\' 8"  (1.727 m)  Weight: 205 lb (92.987 kg)  SpO2: 98%    Body mass index is 31.18 kg/(m^2).           Review of Systems     Objective:   Physical Exam BP 160/84 mmHg  Pulse 96  Temp(Src) 99.3 F (37.4 C) (Oral)  Resp 16  Ht 5\' 8"  (1.727 m)  Wt 205 lb (92.987 kg)  BMI 31.18 kg/m2  SpO2 98%  Gen. well-developed well-nourished male in no apparent distress alert and oriented 3-in a wheelchair-bilateral amputee Lungs no rhonchi or wheezing Cardiovascular regular rhythm no murmurs Abdomen soft nontender no masses Right upper extremity with good pulse and thrill and radial-cephalic AV fistula. One prominent branch is noted in the distal forearm. Flow does improve in the fistula with compression of this branch. Right hand appears adequately perfused. Today I ordered a duplex scan of the fistula which reveals one or 2 competing branches in the mid to distal forearm of the right  arm AV fistula.     Assessment:     End-stage renal disease with right radial-cephalic AV fistula with competing branch right forearm    Plan:     Plan ligation of competing branch or branches right radial-cephalic AV fistula on Wednesday, June 8 as an outpatient discussed with patient and his family and they agreed to proceed If  patient develops increasing pain or numbness and right hand may have to consider ligation of fistula in the future although today he is tolerating that well

## 2014-06-19 NOTE — Op Note (Signed)
OPERATIVE REPORT  Date of Surgery: 06/19/2014  Surgeon: [name], MD  Assistant: Nurse  Pre-op Diagnosis: End Stage Renal Disease with poorly maturing right radial cephalic AV fistula  Post-op Diagnosis: Same  Procedure: Procedure(s): Right Arm LIGATION OF COMPETING BRANCHES OF RADIOCEPHALIC ARTERIOVENOUS FISTULA  Anesthesia: Mac  EBL: Minimal  Complications: None  Procedure Details: The patient was taken to the operating room placed in supine position at which time the right upper extremity was prepped with Betadine scrub and solution draped in routine sterile manner. The arm was then examined with the SonoSite ultrasound. 2 significant competing branches were identified and marked on the skin surface one in the lower third of the forearm and one in the proximal third of the forearm. After infiltration with 1% Xylocaine with epinephrine short longitudinal incisions were made over both branches and branches identified ligated with 3-0 silk ties and divided. Once again the fistula was examined and no further branches were noted. There did seem to be an improved pulse and thrill in the fistula. Adequate hemostasis was achieved and wounds were closed in layers with Vicryls in subcuticular fashion with Dermabond patient taken to the recovery room in satisfactory condition   [name], MD 06/19/2014 6:56 PM

## 2014-06-19 NOTE — Anesthesia Postprocedure Evaluation (Signed)
Anesthesia Post Note  Patient: Aaron Long  Procedure(s) Performed: Procedure(s) (LRB): Right Arm LIGATION OF COMPETING BRANCHES OF RADIOCEPHALIC ARTERIOVENOUS FISTULA (Right)  Anesthesia type: MAC  Patient location: PACU  Post pain: Pain level controlled and Adequate analgesia  Post assessment: Post-op Vital signs reviewed, Patient's Cardiovascular Status Stable and Respiratory Function Stable  Last Vitals:  Filed Vitals:   06/19/14 1225  BP: 157/70  Pulse: 84  Temp:   Resp: 16    Post vital signs: Reviewed and stable  Level of consciousness: awake, alert  and oriented  Complications: No apparent anesthesia complications

## 2014-06-19 NOTE — OR Nursing (Signed)
Samantha Rhyme, PA-C and Dr. Kellie Simmering in to evaluate patient's rt hand/RUE for coolness/numbness. The patient stated the numbness is slightly better than before surgery. Pt instructed to call MD if coolness/numbness worsens.

## 2014-06-19 NOTE — Transfer of Care (Signed)
Immediate Anesthesia Transfer of Care Note  Patient: Aaron Long  Procedure(s) Performed: Procedure(s): Right Arm LIGATION OF COMPETING BRANCHES OF RADIOCEPHALIC ARTERIOVENOUS FISTULA (Right)  Patient Location: PACU  Anesthesia Type:MAC  Level of Consciousness: alert , oriented and sedated  Airway & Oxygen Therapy: Patient Spontanous Breathing and Patient connected to face mask oxygen  Post-op Assessment: Report given to RN, Post -op Vital signs reviewed and stable and Patient moving all extremities X 4  Post vital signs: Reviewed and stable  Last Vitals:  Filed Vitals:   06/19/14 0754  BP: 133/55  Pulse: 90  Temp: 36.8 C  Resp: 18    Complications: No apparent anesthesia complications

## 2014-06-19 NOTE — Interval H&P Note (Signed)
History and Physical Interval Note:  06/19/2014 9:44 AM  Aaron Long  has presented today for surgery, with the diagnosis of End Stage Renal Disease N18.6  The various methods of treatment have been discussed with the patient and family. After consideration of risks, benefits and other options for treatment, the patient has consented to  Procedure(s): LIGATION OF COMPETING BRANCHES OF RADIOCEPHALIC ARTERIOVENOUS FISTULA (Left) as a surgical intervention .  [name] history has been reviewed, patient examined, no change in status, stable for surgery.  I have reviewed the patient's chart and labs.  Questions were answered to the patient's satisfaction.     [name]

## 2014-06-19 NOTE — Discharge Instructions (Signed)
° ° °  06/19/2014 [name] [id number] 1940/06/19  Surgeon(s): Aaron Misty, MD  Procedure(s): Right Arm LIGATION OF COMPETING BRANCHES OF RADIOCEPHALIC ARTERIOVENOUS FISTULA  x Do not stick fistula for 4 weeks

## 2014-06-20 ENCOUNTER — Encounter (HOSPITAL_COMMUNITY): Payer: Self-pay | Admitting: Vascular Surgery

## 2014-06-20 DIAGNOSIS — D509 Iron deficiency anemia, unspecified: Secondary | ICD-10-CM | POA: Diagnosis not present

## 2014-06-20 DIAGNOSIS — E119 Type 2 diabetes mellitus without complications: Secondary | ICD-10-CM | POA: Diagnosis not present

## 2014-06-20 DIAGNOSIS — N186 End stage renal disease: Secondary | ICD-10-CM | POA: Diagnosis not present

## 2014-06-20 DIAGNOSIS — E214 Other specified disorders of parathyroid gland: Secondary | ICD-10-CM | POA: Diagnosis not present

## 2014-06-20 DIAGNOSIS — D631 Anemia in chronic kidney disease: Secondary | ICD-10-CM | POA: Diagnosis not present

## 2014-06-22 DIAGNOSIS — E214 Other specified disorders of parathyroid gland: Secondary | ICD-10-CM | POA: Diagnosis not present

## 2014-06-22 DIAGNOSIS — N186 End stage renal disease: Secondary | ICD-10-CM | POA: Diagnosis not present

## 2014-06-22 DIAGNOSIS — E119 Type 2 diabetes mellitus without complications: Secondary | ICD-10-CM | POA: Diagnosis not present

## 2014-06-22 DIAGNOSIS — D631 Anemia in chronic kidney disease: Secondary | ICD-10-CM | POA: Diagnosis not present

## 2014-06-22 DIAGNOSIS — D509 Iron deficiency anemia, unspecified: Secondary | ICD-10-CM | POA: Diagnosis not present

## 2014-06-24 ENCOUNTER — Other Ambulatory Visit: Payer: Self-pay | Admitting: *Deleted

## 2014-06-24 MED ORDER — HYDRALAZINE HCL 100 MG PO TABS: 100.0000 mg | ORAL_TABLET | Freq: Three times a day (TID) | ORAL | Status: DC

## 2014-06-24 NOTE — Telephone Encounter (Signed)
[facility] [place] 

## 2014-06-25 DIAGNOSIS — D631 Anemia in chronic kidney disease: Secondary | ICD-10-CM | POA: Diagnosis not present

## 2014-06-25 DIAGNOSIS — N186 End stage renal disease: Secondary | ICD-10-CM | POA: Diagnosis not present

## 2014-06-25 DIAGNOSIS — E119 Type 2 diabetes mellitus without complications: Secondary | ICD-10-CM | POA: Diagnosis not present

## 2014-06-25 DIAGNOSIS — D509 Iron deficiency anemia, unspecified: Secondary | ICD-10-CM | POA: Diagnosis not present

## 2014-06-25 DIAGNOSIS — E214 Other specified disorders of parathyroid gland: Secondary | ICD-10-CM | POA: Diagnosis not present

## 2014-06-26 ENCOUNTER — Ambulatory Visit: Payer: Medicare Other | Admitting: Physical Therapy

## 2014-06-27 DIAGNOSIS — N186 End stage renal disease: Secondary | ICD-10-CM | POA: Diagnosis not present

## 2014-06-27 DIAGNOSIS — D631 Anemia in chronic kidney disease: Secondary | ICD-10-CM | POA: Diagnosis not present

## 2014-06-27 DIAGNOSIS — E119 Type 2 diabetes mellitus without complications: Secondary | ICD-10-CM | POA: Diagnosis not present

## 2014-06-27 DIAGNOSIS — E214 Other specified disorders of parathyroid gland: Secondary | ICD-10-CM | POA: Diagnosis not present

## 2014-06-27 DIAGNOSIS — D509 Iron deficiency anemia, unspecified: Secondary | ICD-10-CM | POA: Diagnosis not present

## 2014-06-28 ENCOUNTER — Ambulatory Visit: Payer: Medicare Other | Admitting: Physical Therapy

## 2014-06-29 DIAGNOSIS — D631 Anemia in chronic kidney disease: Secondary | ICD-10-CM | POA: Diagnosis not present

## 2014-06-29 DIAGNOSIS — N186 End stage renal disease: Secondary | ICD-10-CM | POA: Diagnosis not present

## 2014-06-29 DIAGNOSIS — E214 Other specified disorders of parathyroid gland: Secondary | ICD-10-CM | POA: Diagnosis not present

## 2014-06-29 DIAGNOSIS — D509 Iron deficiency anemia, unspecified: Secondary | ICD-10-CM | POA: Diagnosis not present

## 2014-06-29 DIAGNOSIS — E119 Type 2 diabetes mellitus without complications: Secondary | ICD-10-CM | POA: Diagnosis not present

## 2014-07-01 ENCOUNTER — Ambulatory Visit: Payer: Medicare Other | Admitting: Physical Therapy

## 2014-07-02 DIAGNOSIS — D631 Anemia in chronic kidney disease: Secondary | ICD-10-CM | POA: Diagnosis not present

## 2014-07-02 DIAGNOSIS — D509 Iron deficiency anemia, unspecified: Secondary | ICD-10-CM | POA: Diagnosis not present

## 2014-07-02 DIAGNOSIS — E119 Type 2 diabetes mellitus without complications: Secondary | ICD-10-CM | POA: Diagnosis not present

## 2014-07-02 DIAGNOSIS — N186 End stage renal disease: Secondary | ICD-10-CM | POA: Diagnosis not present

## 2014-07-02 DIAGNOSIS — E214 Other specified disorders of parathyroid gland: Secondary | ICD-10-CM | POA: Diagnosis not present

## 2014-07-03 ENCOUNTER — Ambulatory Visit: Payer: Medicare Other | Admitting: Physical Therapy

## 2014-07-04 ENCOUNTER — Ambulatory Visit: Payer: Medicare Other | Admitting: Nurse Practitioner

## 2014-07-04 DIAGNOSIS — N186 End stage renal disease: Secondary | ICD-10-CM | POA: Diagnosis not present

## 2014-07-04 DIAGNOSIS — D631 Anemia in chronic kidney disease: Secondary | ICD-10-CM | POA: Diagnosis not present

## 2014-07-04 DIAGNOSIS — E119 Type 2 diabetes mellitus without complications: Secondary | ICD-10-CM | POA: Diagnosis not present

## 2014-07-04 DIAGNOSIS — D509 Iron deficiency anemia, unspecified: Secondary | ICD-10-CM | POA: Diagnosis not present

## 2014-07-04 DIAGNOSIS — E214 Other specified disorders of parathyroid gland: Secondary | ICD-10-CM | POA: Diagnosis not present

## 2014-07-06 DIAGNOSIS — E119 Type 2 diabetes mellitus without complications: Secondary | ICD-10-CM | POA: Diagnosis not present

## 2014-07-06 DIAGNOSIS — E214 Other specified disorders of parathyroid gland: Secondary | ICD-10-CM | POA: Diagnosis not present

## 2014-07-06 DIAGNOSIS — D631 Anemia in chronic kidney disease: Secondary | ICD-10-CM | POA: Diagnosis not present

## 2014-07-06 DIAGNOSIS — N186 End stage renal disease: Secondary | ICD-10-CM | POA: Diagnosis not present

## 2014-07-06 DIAGNOSIS — D509 Iron deficiency anemia, unspecified: Secondary | ICD-10-CM | POA: Diagnosis not present

## 2014-07-08 ENCOUNTER — Other Ambulatory Visit: Payer: Self-pay

## 2014-07-09 DIAGNOSIS — D631 Anemia in chronic kidney disease: Secondary | ICD-10-CM | POA: Diagnosis not present

## 2014-07-09 DIAGNOSIS — D509 Iron deficiency anemia, unspecified: Secondary | ICD-10-CM | POA: Diagnosis not present

## 2014-07-09 DIAGNOSIS — E119 Type 2 diabetes mellitus without complications: Secondary | ICD-10-CM | POA: Diagnosis not present

## 2014-07-09 DIAGNOSIS — E214 Other specified disorders of parathyroid gland: Secondary | ICD-10-CM | POA: Diagnosis not present

## 2014-07-09 DIAGNOSIS — N186 End stage renal disease: Secondary | ICD-10-CM | POA: Diagnosis not present

## 2014-07-10 ENCOUNTER — Encounter: Payer: Medicare Other | Admitting: Physical Therapy

## 2014-07-11 DIAGNOSIS — D631 Anemia in chronic kidney disease: Secondary | ICD-10-CM | POA: Diagnosis not present

## 2014-07-11 DIAGNOSIS — E1122 Type 2 diabetes mellitus with diabetic chronic kidney disease: Secondary | ICD-10-CM | POA: Diagnosis not present

## 2014-07-11 DIAGNOSIS — D509 Iron deficiency anemia, unspecified: Secondary | ICD-10-CM | POA: Diagnosis not present

## 2014-07-11 DIAGNOSIS — E214 Other specified disorders of parathyroid gland: Secondary | ICD-10-CM | POA: Diagnosis not present

## 2014-07-11 DIAGNOSIS — E119 Type 2 diabetes mellitus without complications: Secondary | ICD-10-CM | POA: Diagnosis not present

## 2014-07-11 DIAGNOSIS — Z992 Dependence on renal dialysis: Secondary | ICD-10-CM | POA: Diagnosis not present

## 2014-07-11 DIAGNOSIS — N186 End stage renal disease: Secondary | ICD-10-CM | POA: Diagnosis not present

## 2014-07-12 ENCOUNTER — Encounter: Payer: Self-pay | Admitting: Internal Medicine

## 2014-07-12 ENCOUNTER — Ambulatory Visit (INDEPENDENT_AMBULATORY_CARE_PROVIDER_SITE_OTHER): Payer: Medicare Other | Admitting: Internal Medicine

## 2014-07-12 ENCOUNTER — Encounter: Payer: Medicare Other | Admitting: Physical Therapy

## 2014-07-12 VITALS — BP 132/76 | HR 94 | Temp 98.4°F

## 2014-07-12 DIAGNOSIS — N186 End stage renal disease: Secondary | ICD-10-CM

## 2014-07-12 DIAGNOSIS — Z992 Dependence on renal dialysis: Secondary | ICD-10-CM | POA: Diagnosis not present

## 2014-07-12 DIAGNOSIS — N390 Urinary tract infection, site not specified: Secondary | ICD-10-CM

## 2014-07-12 DIAGNOSIS — R319 Hematuria, unspecified: Secondary | ICD-10-CM | POA: Diagnosis not present

## 2014-07-12 DIAGNOSIS — I2510 Atherosclerotic heart disease of native coronary artery without angina pectoris: Secondary | ICD-10-CM

## 2014-07-12 DIAGNOSIS — I739 Peripheral vascular disease, unspecified: Secondary | ICD-10-CM | POA: Diagnosis not present

## 2014-07-12 LAB — POCT URINALYSIS DIPSTICK
GLUCOSE UA: NEGATIVE
Nitrite, UA: NEGATIVE
PH UA: 6

## 2014-07-12 MED ORDER — CIPROFLOXACIN HCL 500 MG PO TABS: 500.0000 mg | ORAL_TABLET | Freq: Every day | ORAL | Status: DC

## 2014-07-12 NOTE — Progress Notes (Signed)
Patient ID: Aaron Long, male   DOB: 1940/04/01, 74 y.o.   MRN: [id number]    Location:    [facility]   Place of Service:   OFFICE  Chief Complaint  Patient presents with  . Acute Visit    Dark red blood when urinating since last night. Patient also c/o burning when urinating     HPI:  74 yo male seen today for hematuria, dysuria, urinary frequency/urgency x 1 day. (+) associated chills. No fever. No N/V. He has abdominal cramping but reports being constipated. No back pain. He is ESRD/HD TThSa. He is b/l BKA due to PVD. He had an o/p procedure for right forearm AV ligation due to PVD. He has RUE AVF and a temporary right ACW perm-a-cath.  Past Medical History  Diagnosis Date  . PAD (peripheral artery disease)     a. R CEA 2007. b. Hx L BKA in 2013. c. s/p LE angioplasty in 2014. d. Hx R BKA in 2014.  . Gangrene of toe     dry  . Neuromuscular disorder     diabetic neuropathy  . Hypertension   . Hyperlipidemia   . Peripheral neuropathy   . Constipation     takes Miralax daily  . Hemorrhoids   . Hx of colonic polyps   . Coronary artery disease     a. s/p NSTEMI/CABGx4 in 2007 - LIMA-LAD, SVG-optional diagonal, SVVG-OM, SVG-dRCA. b. Nuc 05/2011: nonischemic.  [name] BPH (benign prostatic hypertrophy)   . Abnormality of gait   . DVT (deep venous thrombosis)     a. Lower extremity DVT in 2013.  [name] Hypertrophy of prostate without urinary obstruction and other lower urinary tract symptoms (LUTS)   . Disorder of bone and cartilage, unspecified   . Lower limb amputation, below knee   . Anemia   . Secondary hyperparathyroidism     Secondary Hyperpara- Thyroidism, Renal  . Ischemic cardiomyopathy     a. EF 40% in 2007. b. 51% by nuc 05/2011.  [name] Complication of anesthesia     "he gets delirious"  . Myocardial infarction 2005  . Type II diabetes mellitus   . Diabetic peripheral neuropathy   . ESRD (end stage renal disease)     "not ready yet for dialysis" (04/17/2014)  . Kidney stones     . Prostate cancer 2010    Past Surgical History  Procedure Laterality Date  . Prostatectomy  2009  . Knee cartilage surgery Left 1964  . Coronary artery bypass graft  2005    CABG X4  . Carotid endarterectomy Right 2005  . Cataract extraction w/ intraocular lens  implant, bilateral Bilateral 2011  . Colonoscopy    . Cardiac catheterization  05/28/11  . Amputation  06/14/2011    Procedure: AMPUTATION DIGIT;  Surgeon: Elam Dutch, MD;  Location: [facility] OR;  Service: Vascular;  Laterality: Left;  Amputation Left fifth toe  . Amputation  06/16/2011    Procedure: AMPUTATION BELOW KNEE;  Surgeon: Elam Dutch, MD;  Location: [place];  Service: Vascular;  Laterality: Left;  . I&d extremity  01/31/2012    Procedure: IRRIGATION AND DEBRIDEMENT EXTREMITY;  Surgeon: Elam Dutch, MD;  Location: [place];  Service: Vascular;  Laterality: Left;  I & D Left BKA   . Amputation Right 06/12/2012    Procedure: AMPUTATION DIGIT;  Surgeon: Elam Dutch, MD;  Location: [facility] OR;  Service: Vascular;  Laterality: Right;  GREAT TOE  . Amputation Right 08/08/2012  Procedure: AMPUTATION BELOW KNEE;  Surgeon: Elam Dutch, MD;  Location: [place];  Service: Vascular;  Laterality: Right;  . Abdominal aortagram N/A 05/28/2011    Procedure: ABDOMINAL Maxcine Ham;  Surgeon: Elam Dutch, MD;  Location: [facility] CATH LAB;  Service: Cardiovascular;  Laterality: N/A;  . Abdominal aortagram N/A 06/02/2012    Procedure: ABDOMINAL Maxcine Ham;  Surgeon: Elam Dutch, MD;  Location: [facility] CATH LAB;  Service: Cardiovascular;  Laterality: N/A;  . Abdominal aortagram N/A 06/09/2012    Procedure: ABDOMINAL Maxcine Ham;  Surgeon: Elam Dutch, MD;  Location: [facility] CATH LAB;  Service: Cardiovascular;  Laterality: N/A;  . Insertion of dialysis catheter Right 04/19/2014    Procedure: INSERTION OF DIALYSIS CATHETER-INTERNAL JUGULAR;  Surgeon: Mal Misty, MD;  Location: [place];  Service: Vascular;  Laterality: Right;  . Av fistula  placement Right 04/19/2014    Procedure: RIGHT ARM ARTERIOVENOUS (AV) FISTULA CREATION;  Surgeon: Mal Misty, MD;  Location: [place];  Service: Vascular;  Laterality: Right;  . Ligation of competing branches of arteriovenous fistula Right 06/19/2014    Procedure: Right Arm LIGATION OF COMPETING BRANCHES OF RADIOCEPHALIC ARTERIOVENOUS FISTULA;  Surgeon: Mal Misty, MD;  Location: [place];  Service: Vascular;  Laterality: Right;    Patient Care Team: [name], NP as PCP - General (Nurse Practitioner) Salem Senate, MD (Nephrology) Burnell Blanks, MD (Cardiology) Elayne Snare, MD as Attending Physician (Endocrinology) Inocencio Homes, DPM as Attending Physician (Podiatry)  History   Social History  . Marital Status: Married    Spouse Name: N/A  . Number of Children: 3  . Years of Education: N/A   Occupational History  . Retired-[employer]    Social History Main Topics  . Smoking status: Former Smoker -- 1.00 packs/day for 30 years    Types: Cigarettes    Quit date: 04/28/1991  . Smokeless tobacco: Never Used  . Alcohol Use: Yes     Comment: 04/17/2014 "might have a glass of wine at Christmas"  . Drug Use: No  . Sexual Activity: No   Other Topics Concern  . Not on file   Social History Narrative     reports that he quit smoking about 23 years ago. His smoking use included Cigarettes. He has a 30 pack-year smoking history. He has never used smokeless tobacco. He reports that he drinks alcohol. He reports that he does not use illicit drugs.  Allergies  Allergen Reactions  . Oxycodone Other (See Comments)    Hallucinations    Medications: Patient's Medications  New Prescriptions   No medications on file  Previous Medications   AMBULATORY NON FORMULARY MEDICATION    BD U/P Mini Pen [place] 31GX5MM Use three times daily as directed DX:250.00   AMBULATORY NON FORMULARY MEDICATION    One Touch Ultra 2 Test Strips Sig:  Check blood sugars Three times daily  to keep blood sugars under control Dx: 250.00, 401.9   AMLODIPINE (NORVASC) 10 MG TABLET    Take 1 tablet (10 mg total) by mouth daily.   ASPIRIN EC 81 MG TABLET    Take 1 tablet (81 mg total) by mouth every morning.   CALCIUM ACETATE (PHOSLO) 667 MG CAPSULE    Take 1 capsule (667 mg total) by mouth 3 (three) times daily with meals.   CLOPIDOGREL (PLAVIX) 75 MG TABLET    take 1 tablet by mouth once daily   DOXAZOSIN (CARDURA) 1 MG TABLET    take 1 tablet  by mouth at bedtime   FAMOTIDINE (PEPCID) 20 MG TABLET    Take 1 tablet (20 mg total) by mouth at bedtime.   HYDRALAZINE (APRESOLINE) 100 MG TABLET    Take 1 tablet (100 mg total) by mouth every 8 (eight) hours.   HYDROCODONE-ACETAMINOPHEN (NORCO/VICODIN) 5-325 MG PER TABLET    Take 1 tablet by mouth every 12 hours as needed for pain   INSULIN ASPART (NOVOLOG) 100 UNIT/ML FLEXPEN    Inject 5 Units into the skin 3 (three) times daily with meals. Inject 5 units three times a day with meals if blood sugar greater than 150 only, DX: 250.71   INSULIN DETEMIR (LEVEMIR) 100 UNIT/ML PEN    Inject 15 Units into the skin 2 (two) times daily.   MISC. DEVICES [facility]) MISC    Patient needs new Wheelchair due to his being broken.   MULTIVITAMIN (RENA-VIT) TABS TABLET    Take 1 tablet by mouth at bedtime.   PRAVASTATIN (PRAVACHOL) 20 MG TABLET    Take 1 tablet (20 mg total) by mouth daily.   PREGABALIN (LYRICA) 75 MG CAPSULE    Take 1 capsule (75 mg total) by mouth daily.   QUETIAPINE (SEROQUEL) 25 MG TABLET    Take 1 tablet (25 mg total) by mouth daily.   SENNA-DOCUSATE (SENOKOT-S) 8.6-50 MG PER TABLET    Take 2 tablets by mouth 2 (two) times daily.  Modified Medications   No medications on file  Discontinued Medications   No medications on file    Review of Systems  Constitutional: Positive for chills.  Gastrointestinal: Positive for abdominal pain. Negative for nausea and vomiting.  Genitourinary: Positive for dysuria, urgency, frequency and  hematuria.  All other systems reviewed and are negative.   Filed Vitals:   07/12/14 1435  BP: 132/76  Pulse: 94  Temp: 98.4 F (36.9 C)  TempSrc: Oral  SpO2: 96%   There is no weight on file to calculate BMI.  Physical Exam  Constitutional: He is oriented to person, place, and time. He appears well-developed and well-nourished. No distress.  HENT:  Mouth/Throat: Oropharynx is clear and moist.  Eyes: Pupils are equal, round, and reactive to light. No scleral icterus.  Neck: Neck supple. Carotid bruit is not present. No thyromegaly present.  Cardiovascular: Normal rate, regular rhythm, normal heart sounds and intact distal pulses.  Exam reveals no gallop and no friction rub.   No murmur heard. no distal LE swelling. He has b/l BKA. No UE swelling. Right forearm AVF with reduced thrill (+) bruit.  Pulmonary/Chest: Effort normal and breath sounds normal. He has no wheezes. He has no rales. He exhibits no tenderness.  Right ACW perm-a-cath intact and no secondary signs of infection at insertion site  Abdominal: Soft. Bowel sounds are normal. He exhibits no distension, no abdominal bruit, no pulsatile midline mass and no mass. There is no tenderness. There is no rebound and no guarding.  No CVAT or suprapubic TTP  Musculoskeletal:  B/l BKA  Lymphadenopathy:    He has no cervical adenopathy.  Neurological: He is alert and oriented to person, place, and time. He has normal reflexes.  Skin: Skin is warm and dry. No rash noted.  Psychiatric: He has a normal mood and affect. His behavior is normal. Thought content normal.     Labs reviewed: Admission on 06/19/2014, Discharged on 06/19/2014  Component Date Value Ref Range Status  . Glucose-Capillary 06/19/2014 190* 65 - 99 mg/dL Final  . Sodium 06/19/2014 137  135 - 145 mmol/L Final  . Potassium 06/19/2014 4.5  3.5 - 5.1 mmol/L Final  . Glucose, Bld 06/19/2014 216* 65 - 99 mg/dL Final  . HCT 06/19/2014 45.0  39.0 - 52.0 % Final  .  Hemoglobin 06/19/2014 15.3  13.0 - 17.0 g/dL Final  . Glucose-Capillary 06/19/2014 192* 65 - 99 mg/dL Final  . Glucose-Capillary 06/19/2014 188* 65 - 99 mg/dL Final  . Comment 1 06/19/2014 Notify RN   Final  . Comment 2 06/19/2014 Document in Chart   Final  Admission on 04/25/2014, Discharged on 05/04/2014  Component Date Value Ref Range Status  . Glucose-Capillary 04/25/2014 201* 70 - 99 mg/dL Final  . Glucose-Capillary 04/26/2014 192* 70 - 99 mg/dL Final  . Glucose-Capillary 04/26/2014 116* 70 - 99 mg/dL Final  . Comment 1 04/26/2014 Notify RN   Final  . Glucose-Capillary 04/26/2014 96  70 - 99 mg/dL Final  . Comment 1 04/26/2014 Notify RN   Final  . WBC 04/26/2014 9.2  4.0 - 10.5 K/uL Final  . RBC 04/26/2014 3.15* 4.22 - 5.81 MIL/uL Final  . Hemoglobin 04/26/2014 9.0* 13.0 - 17.0 g/dL Final  . HCT 04/26/2014 28.7* 39.0 - 52.0 % Final  . MCV 04/26/2014 91.1  78.0 - 100.0 fL Final  . MCH 04/26/2014 28.6  26.0 - 34.0 pg Final  . MCHC 04/26/2014 31.4  30.0 - 36.0 g/dL Final  . RDW 04/26/2014 14.7  11.5 - 15.5 % Final  . Platelets 04/26/2014 233  150 - 400 K/uL Final  . Sodium 04/26/2014 136  135 - 145 mmol/L Final  . Potassium 04/26/2014 4.0  3.5 - 5.1 mmol/L Final  . Chloride 04/26/2014 97  96 - 112 mmol/L Final  . CO2 04/26/2014 25  19 - 32 mmol/L Final  . Glucose, Bld 04/26/2014 177* 70 - 99 mg/dL Final  . BUN 04/26/2014 52* 6 - 23 mg/dL Final   DELTA CHECK NOTED  . Creatinine, Ser 04/26/2014 7.96* 0.50 - 1.35 mg/dL Final   DELTA CHECK NOTED  . Calcium 04/26/2014 8.9  8.4 - 10.5 mg/dL Final  . Phosphorus 04/26/2014 6.7* 2.3 - 4.6 mg/dL Final  . Albumin 04/26/2014 3.1* 3.5 - 5.2 g/dL Final  . GFR calc non Af Amer 04/26/2014 6* >90 mL/min Final  . GFR calc Af Amer 04/26/2014 7* >90 mL/min Final   Comment: (NOTE) The eGFR has been calculated using the CKD EPI equation. This calculation has not been validated in all clinical situations. eGFR's persistently <90 mL/min signify  possible Chronic Kidney Disease.   . Anion gap 04/26/2014 14  5 - 15 Final  . Glucose-Capillary 04/26/2014 224* 70 - 99 mg/dL Final  . Glucose-Capillary 04/27/2014 199* 70 - 99 mg/dL Final  . Glucose-Capillary 04/27/2014 179* 70 - 99 mg/dL Final  . Comment 1 04/27/2014 Notify RN   Final  . Glucose-Capillary 04/27/2014 201* 70 - 99 mg/dL Final  . Glucose-Capillary 04/27/2014 199* 70 - 99 mg/dL Final  . Glucose-Capillary 04/28/2014 201* 70 - 99 mg/dL Final  . Glucose-Capillary 04/28/2014 157* 70 - 99 mg/dL Final  . Glucose-Capillary 04/28/2014 214* 70 - 99 mg/dL Final  . Glucose-Capillary 04/28/2014 162* 70 - 99 mg/dL Final  . Glucose-Capillary 04/29/2014 158* 70 - 99 mg/dL Final  . Glucose-Capillary 04/29/2014 334* 70 - 99 mg/dL Final  . WBC 04/29/2014 9.5  4.0 - 10.5 K/uL Final  . RBC 04/29/2014 3.24* 4.22 - 5.81 MIL/uL Final  . Hemoglobin 04/29/2014 9.3* 13.0 - 17.0 g/dL Final  .  HCT 04/29/2014 29.1* 39.0 - 52.0 % Final  . MCV 04/29/2014 89.8  78.0 - 100.0 fL Final  . MCH 04/29/2014 28.7  26.0 - 34.0 pg Final  . MCHC 04/29/2014 32.0  30.0 - 36.0 g/dL Final  . RDW 04/29/2014 14.2  11.5 - 15.5 % Final  . Platelets 04/29/2014 274  150 - 400 K/uL Final  . Sodium 04/29/2014 131* 135 - 145 mmol/L Final  . Potassium 04/29/2014 4.3  3.5 - 5.1 mmol/L Final  . Chloride 04/29/2014 93* 96 - 112 mmol/L Final  . CO2 04/29/2014 23  19 - 32 mmol/L Final  . Glucose, Bld 04/29/2014 81  70 - 99 mg/dL Final  . BUN 04/29/2014 70* 6 - 23 mg/dL Final  . Creatinine, Ser 04/29/2014 9.61* 0.50 - 1.35 mg/dL Final  . Calcium 04/29/2014 8.8  8.4 - 10.5 mg/dL Final  . Phosphorus 04/29/2014 7.9* 2.3 - 4.6 mg/dL Final  . Albumin 04/29/2014 3.3* 3.5 - 5.2 g/dL Final  . GFR calc non Af Amer 04/29/2014 5* >90 mL/min Final  . GFR calc Af Amer 04/29/2014 5* >90 mL/min Final   Comment: (NOTE) The eGFR has been calculated using the CKD EPI equation. This calculation has not been validated in all clinical  situations. eGFR's persistently <90 mL/min signify possible Chronic Kidney Disease.   . Anion gap 04/29/2014 15  5 - 15 Final  . Glucose-Capillary 04/29/2014 90  70 - 99 mg/dL Final  . Glucose-Capillary 04/30/2014 118* 70 - 99 mg/dL Final  . Glucose-Capillary 04/30/2014 186* 70 - 99 mg/dL Final  . Glucose-Capillary 04/30/2014 168* 70 - 99 mg/dL Final  . Glucose-Capillary 04/30/2014 193* 70 - 99 mg/dL Final  . Glucose-Capillary 04/30/2014 221* 70 - 99 mg/dL Final  . Glucose-Capillary 05/01/2014 129* 70 - 99 mg/dL Final  . Glucose-Capillary 05/01/2014 162* 70 - 99 mg/dL Final  . WBC 05/02/2014 7.7  4.0 - 10.5 K/uL Final  . RBC 05/02/2014 3.22* 4.22 - 5.81 MIL/uL Final  . Hemoglobin 05/02/2014 9.2* 13.0 - 17.0 g/dL Final  . HCT 05/02/2014 29.4* 39.0 - 52.0 % Final  . MCV 05/02/2014 91.3  78.0 - 100.0 fL Final  . MCH 05/02/2014 28.6  26.0 - 34.0 pg Final  . MCHC 05/02/2014 31.3  30.0 - 36.0 g/dL Final  . RDW 05/02/2014 14.5  11.5 - 15.5 % Final  . Platelets 05/02/2014 306  150 - 400 K/uL Final  . Sodium 05/02/2014 134* 135 - 145 mmol/L Final  . Potassium 05/02/2014 4.2  3.5 - 5.1 mmol/L Final  . Chloride 05/02/2014 95* 96 - 112 mmol/L Final  . CO2 05/02/2014 27  19 - 32 mmol/L Final  . Glucose, Bld 05/02/2014 139* 70 - 99 mg/dL Final  . BUN 05/02/2014 46* 6 - 23 mg/dL Final  . Creatinine, Ser 05/02/2014 7.58* 0.50 - 1.35 mg/dL Final  . Calcium 05/02/2014 8.9  8.4 - 10.5 mg/dL Final  . Phosphorus 05/02/2014 6.7* 2.3 - 4.6 mg/dL Final  . Albumin 05/02/2014 3.1* 3.5 - 5.2 g/dL Final  . GFR calc non Af Amer 05/02/2014 6* >90 mL/min Final  . GFR calc Af Amer 05/02/2014 7* >90 mL/min Final   Comment: (NOTE) The eGFR has been calculated using the CKD EPI equation. This calculation has not been validated in all clinical situations. eGFR's persistently <90 mL/min signify possible Chronic Kidney Disease.   . Anion gap 05/02/2014 12  5 - 15 Final  . Glucose-Capillary 05/01/2014 122* 70 - 99  mg/dL Final  .  Glucose-Capillary 05/01/2014 240* 70 - 99 mg/dL Final  . Glucose-Capillary 05/02/2014 148* 70 - 99 mg/dL Final  . Glucose-Capillary 05/02/2014 197* 70 - 99 mg/dL Final  . Glucose-Capillary 05/02/2014 85  70 - 99 mg/dL Final  . Glucose-Capillary 05/02/2014 131* 70 - 99 mg/dL Final  . Comment 1 05/02/2014 Notify RN   Final  . Glucose-Capillary 05/03/2014 108* 70 - 99 mg/dL Final  . Comment 1 05/03/2014 Notify RN   Final  . Glucose-Capillary 05/03/2014 143* 70 - 99 mg/dL Final  . Comment 1 05/03/2014 Notify RN   Final  . Glucose-Capillary 05/03/2014 94  70 - 99 mg/dL Final  . Comment 1 05/03/2014 Notify RN   Final  . Glucose-Capillary 05/03/2014 138* 70 - 99 mg/dL Final  . Comment 1 05/03/2014 Notify RN   Final  . Sodium 05/04/2014 135  135 - 145 mmol/L Final  . Potassium 05/04/2014 3.8  3.5 - 5.1 mmol/L Final  . Chloride 05/04/2014 98  96 - 112 mmol/L Final  . CO2 05/04/2014 27  19 - 32 mmol/L Final  . Glucose, Bld 05/04/2014 52* 70 - 99 mg/dL Final  . BUN 05/04/2014 31* 6 - 23 mg/dL Final  . Creatinine, Ser 05/04/2014 5.41* 0.50 - 1.35 mg/dL Final  . Calcium 05/04/2014 8.9  8.4 - 10.5 mg/dL Final  . Phosphorus 05/04/2014 3.9  2.3 - 4.6 mg/dL Final  . Albumin 05/04/2014 3.0* 3.5 - 5.2 g/dL Final  . GFR calc non Af Amer 05/04/2014 9* >90 mL/min Final  . GFR calc Af Amer 05/04/2014 11* >90 mL/min Final   Comment: (NOTE) The eGFR has been calculated using the CKD EPI equation. This calculation has not been validated in all clinical situations. eGFR's persistently <90 mL/min signify possible Chronic Kidney Disease.   . Anion gap 05/04/2014 10  5 - 15 Final  . WBC 05/04/2014 9.9  4.0 - 10.5 K/uL Final  . RBC 05/04/2014 3.19* 4.22 - 5.81 MIL/uL Final  . Hemoglobin 05/04/2014 9.2* 13.0 - 17.0 g/dL Final  . HCT 05/04/2014 29.3* 39.0 - 52.0 % Final  . MCV 05/04/2014 91.8  78.0 - 100.0 fL Final  . MCH 05/04/2014 28.8  26.0 - 34.0 pg Final  . MCHC 05/04/2014 31.4  30.0 -  36.0 g/dL Final  . RDW 05/04/2014 14.6  11.5 - 15.5 % Final  . Platelets 05/04/2014 315  150 - 400 K/uL Final  . Glucose-Capillary 05/04/2014 68* 70 - 99 mg/dL Final  . Comment 1 05/04/2014 Notify RN   Final  . Glucose-Capillary 05/04/2014 109* 70 - 99 mg/dL Final  . Comment 1 05/04/2014 Notify RN   Final  Admission on 04/17/2014, Discharged on 04/25/2014  No results displayed because visit has over 200 results.      No results found.   Assessment/Plan   ICD-9-CM ICD-10-CM   1. Urinary tract infection with hematuria, site unspecified 599.0 N39.0 ciprofloxacin (CIPRO) 500 MG tablet    R31.9 Urinalysis with Reflex Microscopic  2. Hematuria due to #1 599.70 R31.9 POCT urinalysis dipstick     Culture, Urine     Urinalysis with Reflex Microscopic  3. CKD (chronic kidney disease) stage V requiring chronic dialysis on TThSa 585.6 N18.6 Urinalysis with Reflex Microscopic   V45.11 Z99.2   4. PAD (peripheral artery disease) s/p b/l BKA 443.9 I73.9    --spoke to HD nurse via phone. Will start Cipro 515m daily x 10days. He will need to take the Cipro AFTER HD on days he goes to  dialysis.[name]  --Take OTC probiotic daily while on antibiotic  --Continue other medications as ordered  --Follow up as scheduled with Gaspar Cola S. Perlie Gold  [facility] [address] [place], [place] [address] [phone] Cell (Monday-Friday 8 AM - 5 PM) [phone] After 5 PM and follow prompts

## 2014-07-12 NOTE — Patient Instructions (Signed)
Take cipro daily x 10 days. On diaylsis days, take medicine AFTER diaylsis.  Take OTC probiotic daily while on antibiotic  Continue other medications as ordered  Follow up as scheduled

## 2014-07-13 DIAGNOSIS — E214 Other specified disorders of parathyroid gland: Secondary | ICD-10-CM | POA: Diagnosis not present

## 2014-07-13 DIAGNOSIS — E119 Type 2 diabetes mellitus without complications: Secondary | ICD-10-CM | POA: Diagnosis not present

## 2014-07-13 DIAGNOSIS — D509 Iron deficiency anemia, unspecified: Secondary | ICD-10-CM | POA: Diagnosis not present

## 2014-07-13 DIAGNOSIS — D631 Anemia in chronic kidney disease: Secondary | ICD-10-CM | POA: Diagnosis not present

## 2014-07-13 DIAGNOSIS — N186 End stage renal disease: Secondary | ICD-10-CM | POA: Diagnosis not present

## 2014-07-13 LAB — URINALYSIS, ROUTINE W REFLEX MICROSCOPIC
Bilirubin, UA: NEGATIVE
Glucose, UA: NEGATIVE
Nitrite, UA: POSITIVE — AB
SPEC GRAV UA: 1.019 (ref 1.005–1.030)
Urobilinogen, Ur: 1 mg/dL (ref 0.2–1.0)
pH, UA: 5.5 (ref 5.0–7.5)

## 2014-07-13 LAB — MICROSCOPIC EXAMINATION: WBC, UA: >30 /hpf — AB (ref 0–?)

## 2014-07-14 LAB — URINE CULTURE

## 2014-07-16 DIAGNOSIS — E214 Other specified disorders of parathyroid gland: Secondary | ICD-10-CM | POA: Diagnosis not present

## 2014-07-16 DIAGNOSIS — D631 Anemia in chronic kidney disease: Secondary | ICD-10-CM | POA: Diagnosis not present

## 2014-07-16 DIAGNOSIS — D509 Iron deficiency anemia, unspecified: Secondary | ICD-10-CM | POA: Diagnosis not present

## 2014-07-16 DIAGNOSIS — N186 End stage renal disease: Secondary | ICD-10-CM | POA: Diagnosis not present

## 2014-07-16 DIAGNOSIS — E119 Type 2 diabetes mellitus without complications: Secondary | ICD-10-CM | POA: Diagnosis not present

## 2014-07-17 ENCOUNTER — Encounter: Payer: Medicare Other | Admitting: Physical Therapy

## 2014-07-18 DIAGNOSIS — E214 Other specified disorders of parathyroid gland: Secondary | ICD-10-CM | POA: Diagnosis not present

## 2014-07-18 DIAGNOSIS — D631 Anemia in chronic kidney disease: Secondary | ICD-10-CM | POA: Diagnosis not present

## 2014-07-18 DIAGNOSIS — D509 Iron deficiency anemia, unspecified: Secondary | ICD-10-CM | POA: Diagnosis not present

## 2014-07-18 DIAGNOSIS — E119 Type 2 diabetes mellitus without complications: Secondary | ICD-10-CM | POA: Diagnosis not present

## 2014-07-18 DIAGNOSIS — N186 End stage renal disease: Secondary | ICD-10-CM | POA: Diagnosis not present

## 2014-07-20 DIAGNOSIS — D509 Iron deficiency anemia, unspecified: Secondary | ICD-10-CM | POA: Diagnosis not present

## 2014-07-20 DIAGNOSIS — D631 Anemia in chronic kidney disease: Secondary | ICD-10-CM | POA: Diagnosis not present

## 2014-07-20 DIAGNOSIS — E119 Type 2 diabetes mellitus without complications: Secondary | ICD-10-CM | POA: Diagnosis not present

## 2014-07-20 DIAGNOSIS — E214 Other specified disorders of parathyroid gland: Secondary | ICD-10-CM | POA: Diagnosis not present

## 2014-07-20 DIAGNOSIS — N186 End stage renal disease: Secondary | ICD-10-CM | POA: Diagnosis not present

## 2014-07-22 ENCOUNTER — Encounter: Payer: Medicare Other | Admitting: Physical Therapy

## 2014-07-23 DIAGNOSIS — E214 Other specified disorders of parathyroid gland: Secondary | ICD-10-CM | POA: Diagnosis not present

## 2014-07-23 DIAGNOSIS — E119 Type 2 diabetes mellitus without complications: Secondary | ICD-10-CM | POA: Diagnosis not present

## 2014-07-23 DIAGNOSIS — D509 Iron deficiency anemia, unspecified: Secondary | ICD-10-CM | POA: Diagnosis not present

## 2014-07-23 DIAGNOSIS — D631 Anemia in chronic kidney disease: Secondary | ICD-10-CM | POA: Diagnosis not present

## 2014-07-23 DIAGNOSIS — N186 End stage renal disease: Secondary | ICD-10-CM | POA: Diagnosis not present

## 2014-07-24 ENCOUNTER — Encounter: Payer: Medicare Other | Admitting: Physical Therapy

## 2014-07-25 DIAGNOSIS — E214 Other specified disorders of parathyroid gland: Secondary | ICD-10-CM | POA: Diagnosis not present

## 2014-07-25 DIAGNOSIS — D631 Anemia in chronic kidney disease: Secondary | ICD-10-CM | POA: Diagnosis not present

## 2014-07-25 DIAGNOSIS — E119 Type 2 diabetes mellitus without complications: Secondary | ICD-10-CM | POA: Diagnosis not present

## 2014-07-25 DIAGNOSIS — N186 End stage renal disease: Secondary | ICD-10-CM | POA: Diagnosis not present

## 2014-07-25 DIAGNOSIS — D509 Iron deficiency anemia, unspecified: Secondary | ICD-10-CM | POA: Diagnosis not present

## 2014-07-27 DIAGNOSIS — D509 Iron deficiency anemia, unspecified: Secondary | ICD-10-CM | POA: Diagnosis not present

## 2014-07-27 DIAGNOSIS — N186 End stage renal disease: Secondary | ICD-10-CM | POA: Diagnosis not present

## 2014-07-27 DIAGNOSIS — D631 Anemia in chronic kidney disease: Secondary | ICD-10-CM | POA: Diagnosis not present

## 2014-07-27 DIAGNOSIS — E119 Type 2 diabetes mellitus without complications: Secondary | ICD-10-CM | POA: Diagnosis not present

## 2014-07-27 DIAGNOSIS — E214 Other specified disorders of parathyroid gland: Secondary | ICD-10-CM | POA: Diagnosis not present

## 2014-07-29 ENCOUNTER — Encounter: Payer: Medicare Other | Admitting: Physical Therapy

## 2014-07-30 DIAGNOSIS — E214 Other specified disorders of parathyroid gland: Secondary | ICD-10-CM | POA: Diagnosis not present

## 2014-07-30 DIAGNOSIS — E119 Type 2 diabetes mellitus without complications: Secondary | ICD-10-CM | POA: Diagnosis not present

## 2014-07-30 DIAGNOSIS — D509 Iron deficiency anemia, unspecified: Secondary | ICD-10-CM | POA: Diagnosis not present

## 2014-07-30 DIAGNOSIS — D631 Anemia in chronic kidney disease: Secondary | ICD-10-CM | POA: Diagnosis not present

## 2014-07-30 DIAGNOSIS — N186 End stage renal disease: Secondary | ICD-10-CM | POA: Diagnosis not present

## 2014-07-31 ENCOUNTER — Encounter: Payer: Medicare Other | Admitting: Physical Therapy

## 2014-08-01 DIAGNOSIS — N186 End stage renal disease: Secondary | ICD-10-CM | POA: Diagnosis not present

## 2014-08-01 DIAGNOSIS — D509 Iron deficiency anemia, unspecified: Secondary | ICD-10-CM | POA: Diagnosis not present

## 2014-08-01 DIAGNOSIS — E214 Other specified disorders of parathyroid gland: Secondary | ICD-10-CM | POA: Diagnosis not present

## 2014-08-01 DIAGNOSIS — D631 Anemia in chronic kidney disease: Secondary | ICD-10-CM | POA: Diagnosis not present

## 2014-08-01 DIAGNOSIS — E119 Type 2 diabetes mellitus without complications: Secondary | ICD-10-CM | POA: Diagnosis not present

## 2014-08-03 DIAGNOSIS — D631 Anemia in chronic kidney disease: Secondary | ICD-10-CM | POA: Diagnosis not present

## 2014-08-03 DIAGNOSIS — E119 Type 2 diabetes mellitus without complications: Secondary | ICD-10-CM | POA: Diagnosis not present

## 2014-08-03 DIAGNOSIS — E214 Other specified disorders of parathyroid gland: Secondary | ICD-10-CM | POA: Diagnosis not present

## 2014-08-03 DIAGNOSIS — D509 Iron deficiency anemia, unspecified: Secondary | ICD-10-CM | POA: Diagnosis not present

## 2014-08-03 DIAGNOSIS — N186 End stage renal disease: Secondary | ICD-10-CM | POA: Diagnosis not present

## 2014-08-05 ENCOUNTER — Encounter: Payer: Medicare Other | Admitting: Physical Therapy

## 2014-08-06 DIAGNOSIS — D509 Iron deficiency anemia, unspecified: Secondary | ICD-10-CM | POA: Diagnosis not present

## 2014-08-06 DIAGNOSIS — D631 Anemia in chronic kidney disease: Secondary | ICD-10-CM | POA: Diagnosis not present

## 2014-08-06 DIAGNOSIS — E119 Type 2 diabetes mellitus without complications: Secondary | ICD-10-CM | POA: Diagnosis not present

## 2014-08-06 DIAGNOSIS — E214 Other specified disorders of parathyroid gland: Secondary | ICD-10-CM | POA: Diagnosis not present

## 2014-08-06 DIAGNOSIS — N186 End stage renal disease: Secondary | ICD-10-CM | POA: Diagnosis not present

## 2014-08-07 ENCOUNTER — Encounter: Payer: Medicare Other | Admitting: Physical Therapy

## 2014-08-08 DIAGNOSIS — D631 Anemia in chronic kidney disease: Secondary | ICD-10-CM | POA: Diagnosis not present

## 2014-08-08 DIAGNOSIS — N186 End stage renal disease: Secondary | ICD-10-CM | POA: Diagnosis not present

## 2014-08-08 DIAGNOSIS — E119 Type 2 diabetes mellitus without complications: Secondary | ICD-10-CM | POA: Diagnosis not present

## 2014-08-08 DIAGNOSIS — D509 Iron deficiency anemia, unspecified: Secondary | ICD-10-CM | POA: Diagnosis not present

## 2014-08-08 DIAGNOSIS — E214 Other specified disorders of parathyroid gland: Secondary | ICD-10-CM | POA: Diagnosis not present

## 2014-08-10 DIAGNOSIS — D509 Iron deficiency anemia, unspecified: Secondary | ICD-10-CM | POA: Diagnosis not present

## 2014-08-10 DIAGNOSIS — E119 Type 2 diabetes mellitus without complications: Secondary | ICD-10-CM | POA: Diagnosis not present

## 2014-08-10 DIAGNOSIS — D631 Anemia in chronic kidney disease: Secondary | ICD-10-CM | POA: Diagnosis not present

## 2014-08-10 DIAGNOSIS — E214 Other specified disorders of parathyroid gland: Secondary | ICD-10-CM | POA: Diagnosis not present

## 2014-08-10 DIAGNOSIS — N186 End stage renal disease: Secondary | ICD-10-CM | POA: Diagnosis not present

## 2014-08-11 DIAGNOSIS — E1122 Type 2 diabetes mellitus with diabetic chronic kidney disease: Secondary | ICD-10-CM | POA: Diagnosis not present

## 2014-08-11 DIAGNOSIS — N186 End stage renal disease: Secondary | ICD-10-CM | POA: Diagnosis not present

## 2014-08-11 DIAGNOSIS — Z992 Dependence on renal dialysis: Secondary | ICD-10-CM | POA: Diagnosis not present

## 2014-08-12 ENCOUNTER — Encounter: Payer: Medicare Other | Admitting: Physical Therapy

## 2014-08-12 DIAGNOSIS — D631 Anemia in chronic kidney disease: Secondary | ICD-10-CM | POA: Diagnosis not present

## 2014-08-12 DIAGNOSIS — E214 Other specified disorders of parathyroid gland: Secondary | ICD-10-CM | POA: Diagnosis not present

## 2014-08-12 DIAGNOSIS — N186 End stage renal disease: Secondary | ICD-10-CM | POA: Diagnosis not present

## 2014-08-12 DIAGNOSIS — E119 Type 2 diabetes mellitus without complications: Secondary | ICD-10-CM | POA: Diagnosis not present

## 2014-08-14 ENCOUNTER — Other Ambulatory Visit: Payer: Self-pay | Admitting: *Deleted

## 2014-08-14 ENCOUNTER — Encounter: Payer: Medicare Other | Admitting: Physical Therapy

## 2014-08-14 DIAGNOSIS — E214 Other specified disorders of parathyroid gland: Secondary | ICD-10-CM | POA: Diagnosis not present

## 2014-08-14 DIAGNOSIS — E119 Type 2 diabetes mellitus without complications: Secondary | ICD-10-CM | POA: Diagnosis not present

## 2014-08-14 DIAGNOSIS — N186 End stage renal disease: Secondary | ICD-10-CM | POA: Diagnosis not present

## 2014-08-14 DIAGNOSIS — D631 Anemia in chronic kidney disease: Secondary | ICD-10-CM | POA: Diagnosis not present

## 2014-08-14 DIAGNOSIS — Z89511 Acquired absence of right leg below knee: Secondary | ICD-10-CM

## 2014-08-14 DIAGNOSIS — Z89512 Acquired absence of left leg below knee: Principal | ICD-10-CM

## 2014-08-14 MED ORDER — HYDROCODONE-ACETAMINOPHEN 5-325 MG PO TABS
ORAL_TABLET | ORAL | Status: DC
Start: 2014-08-14 — End: 2014-11-05

## 2014-08-16 DIAGNOSIS — N186 End stage renal disease: Secondary | ICD-10-CM | POA: Diagnosis not present

## 2014-08-16 DIAGNOSIS — E214 Other specified disorders of parathyroid gland: Secondary | ICD-10-CM | POA: Diagnosis not present

## 2014-08-16 DIAGNOSIS — D631 Anemia in chronic kidney disease: Secondary | ICD-10-CM | POA: Diagnosis not present

## 2014-08-16 DIAGNOSIS — T82858D Stenosis of vascular prosthetic devices, implants and grafts, subsequent encounter: Secondary | ICD-10-CM | POA: Diagnosis not present

## 2014-08-16 DIAGNOSIS — E119 Type 2 diabetes mellitus without complications: Secondary | ICD-10-CM | POA: Diagnosis not present

## 2014-08-16 DIAGNOSIS — I871 Compression of vein: Secondary | ICD-10-CM | POA: Diagnosis not present

## 2014-08-16 DIAGNOSIS — I771 Stricture of artery: Secondary | ICD-10-CM | POA: Diagnosis not present

## 2014-08-16 DIAGNOSIS — Z992 Dependence on renal dialysis: Secondary | ICD-10-CM | POA: Diagnosis not present

## 2014-08-19 ENCOUNTER — Encounter: Payer: Medicare Other | Admitting: Physical Therapy

## 2014-08-19 DIAGNOSIS — D631 Anemia in chronic kidney disease: Secondary | ICD-10-CM | POA: Diagnosis not present

## 2014-08-19 DIAGNOSIS — N186 End stage renal disease: Secondary | ICD-10-CM | POA: Diagnosis not present

## 2014-08-19 DIAGNOSIS — E214 Other specified disorders of parathyroid gland: Secondary | ICD-10-CM | POA: Diagnosis not present

## 2014-08-19 DIAGNOSIS — E119 Type 2 diabetes mellitus without complications: Secondary | ICD-10-CM | POA: Diagnosis not present

## 2014-08-21 ENCOUNTER — Encounter: Payer: Medicare Other | Admitting: Physical Therapy

## 2014-08-21 DIAGNOSIS — N186 End stage renal disease: Secondary | ICD-10-CM | POA: Diagnosis not present

## 2014-08-21 DIAGNOSIS — D631 Anemia in chronic kidney disease: Secondary | ICD-10-CM | POA: Diagnosis not present

## 2014-08-21 DIAGNOSIS — E214 Other specified disorders of parathyroid gland: Secondary | ICD-10-CM | POA: Diagnosis not present

## 2014-08-21 DIAGNOSIS — E119 Type 2 diabetes mellitus without complications: Secondary | ICD-10-CM | POA: Diagnosis not present

## 2014-08-23 DIAGNOSIS — E214 Other specified disorders of parathyroid gland: Secondary | ICD-10-CM | POA: Diagnosis not present

## 2014-08-23 DIAGNOSIS — D631 Anemia in chronic kidney disease: Secondary | ICD-10-CM | POA: Diagnosis not present

## 2014-08-23 DIAGNOSIS — N186 End stage renal disease: Secondary | ICD-10-CM | POA: Diagnosis not present

## 2014-08-23 DIAGNOSIS — E119 Type 2 diabetes mellitus without complications: Secondary | ICD-10-CM | POA: Diagnosis not present

## 2014-08-26 ENCOUNTER — Encounter: Payer: Medicare Other | Admitting: Physical Therapy

## 2014-08-26 DIAGNOSIS — N186 End stage renal disease: Secondary | ICD-10-CM | POA: Diagnosis not present

## 2014-08-26 DIAGNOSIS — E119 Type 2 diabetes mellitus without complications: Secondary | ICD-10-CM | POA: Diagnosis not present

## 2014-08-26 DIAGNOSIS — D631 Anemia in chronic kidney disease: Secondary | ICD-10-CM | POA: Diagnosis not present

## 2014-08-26 DIAGNOSIS — E214 Other specified disorders of parathyroid gland: Secondary | ICD-10-CM | POA: Diagnosis not present

## 2014-08-28 ENCOUNTER — Encounter: Payer: Medicare Other | Admitting: Physical Therapy

## 2014-08-28 DIAGNOSIS — D631 Anemia in chronic kidney disease: Secondary | ICD-10-CM | POA: Diagnosis not present

## 2014-08-28 DIAGNOSIS — N186 End stage renal disease: Secondary | ICD-10-CM | POA: Diagnosis not present

## 2014-08-28 DIAGNOSIS — E119 Type 2 diabetes mellitus without complications: Secondary | ICD-10-CM | POA: Diagnosis not present

## 2014-08-28 DIAGNOSIS — E214 Other specified disorders of parathyroid gland: Secondary | ICD-10-CM | POA: Diagnosis not present

## 2014-08-29 ENCOUNTER — Other Ambulatory Visit: Payer: Self-pay | Admitting: *Deleted

## 2014-08-29 MED ORDER — PREGABALIN 75 MG PO CAPS: ORAL_CAPSULE | ORAL | Status: DC

## 2014-08-29 NOTE — Telephone Encounter (Signed)
Wife called and requested refill to be faxed to pharmacy

## 2014-08-30 ENCOUNTER — Other Ambulatory Visit: Payer: Self-pay | Admitting: *Deleted

## 2014-08-30 DIAGNOSIS — E119 Type 2 diabetes mellitus without complications: Secondary | ICD-10-CM | POA: Diagnosis not present

## 2014-08-30 DIAGNOSIS — N186 End stage renal disease: Secondary | ICD-10-CM | POA: Diagnosis not present

## 2014-08-30 DIAGNOSIS — D631 Anemia in chronic kidney disease: Secondary | ICD-10-CM | POA: Diagnosis not present

## 2014-08-30 DIAGNOSIS — E214 Other specified disorders of parathyroid gland: Secondary | ICD-10-CM | POA: Diagnosis not present

## 2014-08-30 MED ORDER — PREGABALIN 75 MG PO CAPS: ORAL_CAPSULE | ORAL | Status: DC

## 2014-08-30 NOTE — Telephone Encounter (Signed)
[facility] did not [facility] Rx. Faxed again.

## 2014-09-02 ENCOUNTER — Encounter: Payer: Medicare Other | Admitting: Physical Therapy

## 2014-09-02 DIAGNOSIS — E119 Type 2 diabetes mellitus without complications: Secondary | ICD-10-CM | POA: Diagnosis not present

## 2014-09-02 DIAGNOSIS — D631 Anemia in chronic kidney disease: Secondary | ICD-10-CM | POA: Diagnosis not present

## 2014-09-02 DIAGNOSIS — N186 End stage renal disease: Secondary | ICD-10-CM | POA: Diagnosis not present

## 2014-09-02 DIAGNOSIS — E214 Other specified disorders of parathyroid gland: Secondary | ICD-10-CM | POA: Diagnosis not present

## 2014-09-04 ENCOUNTER — Encounter: Payer: Medicare Other | Admitting: Physical Therapy

## 2014-09-04 DIAGNOSIS — E119 Type 2 diabetes mellitus without complications: Secondary | ICD-10-CM | POA: Diagnosis not present

## 2014-09-04 DIAGNOSIS — E214 Other specified disorders of parathyroid gland: Secondary | ICD-10-CM | POA: Diagnosis not present

## 2014-09-04 DIAGNOSIS — N186 End stage renal disease: Secondary | ICD-10-CM | POA: Diagnosis not present

## 2014-09-04 DIAGNOSIS — D631 Anemia in chronic kidney disease: Secondary | ICD-10-CM | POA: Diagnosis not present

## 2014-09-06 DIAGNOSIS — E119 Type 2 diabetes mellitus without complications: Secondary | ICD-10-CM | POA: Diagnosis not present

## 2014-09-06 DIAGNOSIS — N186 End stage renal disease: Secondary | ICD-10-CM | POA: Diagnosis not present

## 2014-09-06 DIAGNOSIS — D631 Anemia in chronic kidney disease: Secondary | ICD-10-CM | POA: Diagnosis not present

## 2014-09-06 DIAGNOSIS — E214 Other specified disorders of parathyroid gland: Secondary | ICD-10-CM | POA: Diagnosis not present

## 2014-09-08 ENCOUNTER — Emergency Department (HOSPITAL_COMMUNITY): Payer: Medicare Other

## 2014-09-08 ENCOUNTER — Inpatient Hospital Stay (HOSPITAL_COMMUNITY): Payer: Medicare Other

## 2014-09-08 ENCOUNTER — Inpatient Hospital Stay (HOSPITAL_COMMUNITY)
Admission: EM | Admit: 2014-09-08 | Discharge: 2014-09-14 | DRG: 853 | Disposition: A | Discharge status: Home Health | Payer: Medicare Other | Attending: Internal Medicine | Admitting: Internal Medicine

## 2014-09-08 ENCOUNTER — Other Ambulatory Visit: Payer: Self-pay | Admitting: Internal Medicine

## 2014-09-08 ENCOUNTER — Encounter (HOSPITAL_COMMUNITY): Payer: Self-pay | Admitting: Nurse Practitioner

## 2014-09-08 DIAGNOSIS — M5010 Cervical disc disorder with radiculopathy, unspecified cervical region: Secondary | ICD-10-CM | POA: Diagnosis present

## 2014-09-08 DIAGNOSIS — F329 Major depressive disorder, single episode, unspecified: Secondary | ICD-10-CM | POA: Diagnosis present

## 2014-09-08 DIAGNOSIS — N2581 Secondary hyperparathyroidism of renal origin: Secondary | ICD-10-CM | POA: Diagnosis present

## 2014-09-08 DIAGNOSIS — T827XXA Infection and inflammatory reaction due to other cardiac and vascular devices, implants and grafts, initial encounter: Secondary | ICD-10-CM | POA: Diagnosis present

## 2014-09-08 DIAGNOSIS — E1129 Type 2 diabetes mellitus with other diabetic kidney complication: Secondary | ICD-10-CM | POA: Diagnosis present

## 2014-09-08 DIAGNOSIS — Z951 Presence of aortocoronary bypass graft: Secondary | ICD-10-CM

## 2014-09-08 DIAGNOSIS — Z7902 Long term (current) use of antithrombotics/antiplatelets: Secondary | ICD-10-CM

## 2014-09-08 DIAGNOSIS — E785 Hyperlipidemia, unspecified: Secondary | ICD-10-CM | POA: Diagnosis present

## 2014-09-08 DIAGNOSIS — I120 Hypertensive chronic kidney disease with stage 5 chronic kidney disease or end stage renal disease: Secondary | ICD-10-CM | POA: Diagnosis present

## 2014-09-08 DIAGNOSIS — I10 Essential (primary) hypertension: Secondary | ICD-10-CM | POA: Diagnosis present

## 2014-09-08 DIAGNOSIS — N186 End stage renal disease: Secondary | ICD-10-CM

## 2014-09-08 DIAGNOSIS — K219 Gastro-esophageal reflux disease without esophagitis: Secondary | ICD-10-CM | POA: Diagnosis present

## 2014-09-08 DIAGNOSIS — E1122 Type 2 diabetes mellitus with diabetic chronic kidney disease: Secondary | ICD-10-CM | POA: Diagnosis present

## 2014-09-08 DIAGNOSIS — Z86718 Personal history of other venous thrombosis and embolism: Secondary | ICD-10-CM | POA: Diagnosis not present

## 2014-09-08 DIAGNOSIS — D638 Anemia in other chronic diseases classified elsewhere: Secondary | ICD-10-CM | POA: Diagnosis not present

## 2014-09-08 DIAGNOSIS — Z7982 Long term (current) use of aspirin: Secondary | ICD-10-CM | POA: Diagnosis not present

## 2014-09-08 DIAGNOSIS — Z87891 Personal history of nicotine dependence: Secondary | ICD-10-CM | POA: Diagnosis not present

## 2014-09-08 DIAGNOSIS — K5900 Constipation, unspecified: Secondary | ICD-10-CM | POA: Diagnosis present

## 2014-09-08 DIAGNOSIS — N189 Chronic kidney disease, unspecified: Secondary | ICD-10-CM

## 2014-09-08 DIAGNOSIS — I252 Old myocardial infarction: Secondary | ICD-10-CM

## 2014-09-08 DIAGNOSIS — Z992 Dependence on renal dialysis: Secondary | ICD-10-CM | POA: Diagnosis not present

## 2014-09-08 DIAGNOSIS — R4182 Altered mental status, unspecified: Secondary | ICD-10-CM | POA: Diagnosis not present

## 2014-09-08 DIAGNOSIS — A419 Sepsis, unspecified organism: Principal | ICD-10-CM | POA: Diagnosis present

## 2014-09-08 DIAGNOSIS — N400 Enlarged prostate without lower urinary tract symptoms: Secondary | ICD-10-CM | POA: Diagnosis present

## 2014-09-08 DIAGNOSIS — I70203 Unspecified atherosclerosis of native arteries of extremities, bilateral legs: Secondary | ICD-10-CM | POA: Diagnosis present

## 2014-09-08 DIAGNOSIS — G9341 Metabolic encephalopathy: Secondary | ICD-10-CM | POA: Diagnosis present

## 2014-09-08 DIAGNOSIS — R739 Hyperglycemia, unspecified: Secondary | ICD-10-CM | POA: Diagnosis not present

## 2014-09-08 DIAGNOSIS — IMO0002 Reserved for concepts with insufficient information to code with codable children: Secondary | ICD-10-CM | POA: Diagnosis present

## 2014-09-08 DIAGNOSIS — I739 Peripheral vascular disease, unspecified: Secondary | ICD-10-CM | POA: Diagnosis present

## 2014-09-08 DIAGNOSIS — Z89511 Acquired absence of right leg below knee: Secondary | ICD-10-CM

## 2014-09-08 DIAGNOSIS — Z89512 Acquired absence of left leg below knee: Secondary | ICD-10-CM

## 2014-09-08 DIAGNOSIS — C61 Malignant neoplasm of prostate: Secondary | ICD-10-CM | POA: Diagnosis present

## 2014-09-08 DIAGNOSIS — R7309 Other abnormal glucose: Secondary | ICD-10-CM | POA: Diagnosis not present

## 2014-09-08 DIAGNOSIS — D631 Anemia in chronic kidney disease: Secondary | ICD-10-CM | POA: Diagnosis present

## 2014-09-08 DIAGNOSIS — R7881 Bacteremia: Secondary | ICD-10-CM | POA: Diagnosis not present

## 2014-09-08 DIAGNOSIS — R5381 Other malaise: Secondary | ICD-10-CM

## 2014-09-08 DIAGNOSIS — E1142 Type 2 diabetes mellitus with diabetic polyneuropathy: Secondary | ICD-10-CM | POA: Diagnosis present

## 2014-09-08 DIAGNOSIS — R509 Fever, unspecified: Secondary | ICD-10-CM

## 2014-09-08 DIAGNOSIS — Y828 Other medical devices associated with adverse incidents: Secondary | ICD-10-CM | POA: Diagnosis present

## 2014-09-08 DIAGNOSIS — E1140 Type 2 diabetes mellitus with diabetic neuropathy, unspecified: Secondary | ICD-10-CM | POA: Diagnosis not present

## 2014-09-08 DIAGNOSIS — Z8546 Personal history of malignant neoplasm of prostate: Secondary | ICD-10-CM | POA: Diagnosis not present

## 2014-09-08 DIAGNOSIS — I5030 Unspecified diastolic (congestive) heart failure: Secondary | ICD-10-CM | POA: Diagnosis present

## 2014-09-08 DIAGNOSIS — G9340 Encephalopathy, unspecified: Secondary | ICD-10-CM | POA: Diagnosis present

## 2014-09-08 DIAGNOSIS — I2510 Atherosclerotic heart disease of native coronary artery without angina pectoris: Secondary | ICD-10-CM | POA: Diagnosis present

## 2014-09-08 DIAGNOSIS — E1165 Type 2 diabetes mellitus with hyperglycemia: Secondary | ICD-10-CM

## 2014-09-08 DIAGNOSIS — Z794 Long term (current) use of insulin: Secondary | ICD-10-CM

## 2014-09-08 HISTORY — DX: End stage renal disease: N18.6

## 2014-09-08 HISTORY — DX: Dependence on renal dialysis: Z99.2

## 2014-09-08 LAB — COMPREHENSIVE METABOLIC PANEL
ALBUMIN: 2.9 g/dL — AB (ref 3.5–5.0)
ALK PHOS: 108 U/L (ref 38–126)
ALT: 9 U/L — AB (ref 17–63)
ANION GAP: 12 (ref 5–15)
AST: 16 U/L (ref 15–41)
BUN: 37 mg/dL — AB (ref 6–20)
CALCIUM: 8.4 mg/dL — AB (ref 8.9–10.3)
CO2: 20 mmol/L — AB (ref 22–32)
Chloride: 98 mmol/L — ABNORMAL LOW (ref 101–111)
Creatinine, Ser: 4.73 mg/dL — ABNORMAL HIGH (ref 0.61–1.24)
GFR calc Af Amer: 13 mL/min — ABNORMAL LOW (ref >60)
GFR calc non Af Amer: 11 mL/min — ABNORMAL LOW (ref >60)
GLUCOSE: 457 mg/dL — AB (ref 65–99)
Potassium: 4.7 mmol/L (ref 3.5–5.1)
SODIUM: 130 mmol/L — AB (ref 135–145)
Total Bilirubin: 0.5 mg/dL (ref 0.3–1.2)
Total Protein: 5.9 g/dL — ABNORMAL LOW (ref 6.5–8.1)

## 2014-09-08 LAB — URINALYSIS, ROUTINE W REFLEX MICROSCOPIC
BILIRUBIN URINE: NEGATIVE
Glucose, UA: >1000 mg/dL — AB
HGB URINE DIPSTICK: NEGATIVE
Ketones, ur: NEGATIVE mg/dL
Leukocytes, UA: NEGATIVE
Nitrite: NEGATIVE
PH: 5.5 (ref 5.0–8.0)
Protein, ur: 100 mg/dL — AB
SPECIFIC GRAVITY, URINE: 1.02 (ref 1.005–1.030)
Urobilinogen, UA: 0.2 mg/dL (ref 0.0–1.0)

## 2014-09-08 LAB — CBC WITH DIFFERENTIAL/PLATELET
Basophils Absolute: 0 10*3/uL (ref 0.0–0.1)
Basophils Relative: 0 % (ref 0–1)
EOS PCT: 1 % (ref 0–5)
Eosinophils Absolute: 0.1 10*3/uL (ref 0.0–0.7)
HEMATOCRIT: 31.3 % — AB (ref 39.0–52.0)
Hemoglobin: 10.3 g/dL — ABNORMAL LOW (ref 13.0–17.0)
Lymphocytes Relative: 18 % (ref 12–46)
Lymphs Abs: 1.8 10*3/uL (ref 0.7–4.0)
MCH: 30.2 pg (ref 26.0–34.0)
MCHC: 32.9 g/dL (ref 30.0–36.0)
MCV: 91.8 fL (ref 78.0–100.0)
MONOS PCT: 6 % (ref 3–12)
Monocytes Absolute: 0.6 10*3/uL (ref 0.1–1.0)
NEUTROS ABS: 7.6 10*3/uL (ref 1.7–7.7)
Neutrophils Relative %: 75 % (ref 43–77)
PLATELETS: 168 10*3/uL (ref 150–400)
RBC: 3.41 MIL/uL — ABNORMAL LOW (ref 4.22–5.81)
RDW: 15.4 % (ref 11.5–15.5)
WBC: 10.2 10*3/uL (ref 4.0–10.5)

## 2014-09-08 LAB — URINE MICROSCOPIC-ADD ON

## 2014-09-08 LAB — I-STAT CG4 LACTIC ACID, ED
LACTIC ACID, VENOUS: 1.17 mmol/L (ref 0.5–2.0)
Lactic Acid, Venous: 2.44 mmol/L (ref 0.5–2.0)

## 2014-09-08 LAB — GLUCOSE, CAPILLARY: GLUCOSE-CAPILLARY: 370 mg/dL — AB (ref 65–99)

## 2014-09-08 LAB — CBG MONITORING, ED: Glucose-Capillary: 363 mg/dL — ABNORMAL HIGH (ref 65–99)

## 2014-09-08 MED ORDER — INSULIN DETEMIR 100 UNIT/ML ~~LOC~~ SOLN
18.0000 [IU] | Freq: Two times a day (BID) | SUBCUTANEOUS | Status: DC
  Administered 2014-09-09 – 2014-09-14 (×11): 18 [IU] via SUBCUTANEOUS
  Filled 2014-09-08 (×14): qty 0.18

## 2014-09-08 MED ORDER — HYDROCODONE-ACETAMINOPHEN 5-325 MG PO TABS: 1.0000 | ORAL_TABLET | Freq: Four times a day (QID) | ORAL | Status: DC | PRN

## 2014-09-08 MED ORDER — SODIUM CHLORIDE 0.9 % IV BOLUS (SEPSIS)
500.0000 mL | Freq: Once | INTRAVENOUS | Status: AC
  Administered 2014-09-08: 500 mL via INTRAVENOUS

## 2014-09-08 MED ORDER — SODIUM CHLORIDE 0.9 % IV BOLUS (SEPSIS)
1000.0000 mL | Freq: Once | INTRAVENOUS | Status: AC
  Administered 2014-09-08: 1000 mL via INTRAVENOUS

## 2014-09-08 MED ORDER — HEPARIN SODIUM (PORCINE) 5000 UNIT/ML IJ SOLN
5000.0000 [IU] | Freq: Three times a day (TID) | INTRAMUSCULAR | Status: DC
  Administered 2014-09-09 – 2014-09-13 (×14): 5000 [IU] via SUBCUTANEOUS
  Filled 2014-09-08 (×10): qty 1

## 2014-09-08 MED ORDER — INSULIN ASPART 100 UNIT/ML ~~LOC~~ SOLN
0.0000 [IU] | Freq: Three times a day (TID) | SUBCUTANEOUS | Status: DC
  Administered 2014-09-09: 5 [IU] via SUBCUTANEOUS
  Administered 2014-09-09: 1 [IU] via SUBCUTANEOUS
  Administered 2014-09-09: 2 [IU] via SUBCUTANEOUS
  Administered 2014-09-10: 3 [IU] via SUBCUTANEOUS
  Administered 2014-09-10 (×2): 2 [IU] via SUBCUTANEOUS
  Administered 2014-09-11 – 2014-09-13 (×2): 3 [IU] via SUBCUTANEOUS
  Administered 2014-09-14: 2 [IU] via SUBCUTANEOUS

## 2014-09-08 MED ORDER — CLOPIDOGREL BISULFATE 75 MG PO TABS
75.0000 mg | ORAL_TABLET | Freq: Every day | ORAL | Status: DC
Start: 2014-09-09 — End: 2014-09-14
  Administered 2014-09-09 – 2014-09-14 (×6): 75 mg via ORAL
  Filled 2014-09-08 (×6): qty 1

## 2014-09-08 MED ORDER — PIPERACILLIN-TAZOBACTAM 3.375 G IVPB 30 MIN: 3.3750 g | Freq: Three times a day (TID) | INTRAVENOUS | Status: DC

## 2014-09-08 MED ORDER — QUETIAPINE FUMARATE 25 MG PO TABS
25.0000 mg | ORAL_TABLET | Freq: Every day | ORAL | Status: DC
  Administered 2014-09-09 – 2014-09-14 (×6): 25 mg via ORAL
  Filled 2014-09-08 (×6): qty 1

## 2014-09-08 MED ORDER — HYDRALAZINE HCL 20 MG/ML IJ SOLN
5.0000 mg | INTRAMUSCULAR | Status: DC | PRN
  Administered 2014-09-09: 5 mg via INTRAVENOUS
  Filled 2014-09-08: qty 1

## 2014-09-08 MED ORDER — FAMOTIDINE 20 MG PO TABS
20.0000 mg | ORAL_TABLET | Freq: Every day | ORAL | Status: DC
  Administered 2014-09-09 – 2014-09-13 (×6): 20 mg via ORAL
  Filled 2014-09-08 (×7): qty 1

## 2014-09-08 MED ORDER — INSULIN DETEMIR 100 UNIT/ML FLEXPEN: 18.0000 [IU] | PEN_INJECTOR | Freq: Two times a day (BID) | SUBCUTANEOUS | Status: DC

## 2014-09-08 MED ORDER — ASPIRIN EC 81 MG PO TBEC
81.0000 mg | DELAYED_RELEASE_TABLET | Freq: Every morning | ORAL | Status: DC
  Administered 2014-09-09 – 2014-09-14 (×6): 81 mg via ORAL
  Filled 2014-09-08 (×6): qty 1

## 2014-09-08 MED ORDER — INSULIN ASPART 100 UNIT/ML ~~LOC~~ SOLN
5.0000 [IU] | Freq: Once | SUBCUTANEOUS | Status: AC
  Administered 2014-09-08: 5 [IU] via INTRAVENOUS
  Filled 2014-09-08: qty 1

## 2014-09-08 MED ORDER — VANCOMYCIN HCL IN DEXTROSE 1-5 GM/200ML-% IV SOLN
1000.0000 mg | INTRAVENOUS | Status: DC
Start: 2014-09-08 — End: 2014-09-09
  Administered 2014-09-08: 1000 mg via INTRAVENOUS
  Filled 2014-09-08: qty 200

## 2014-09-08 MED ORDER — SODIUM CHLORIDE 0.9 % IV SOLN
INTRAVENOUS | Status: AC
  Administered 2014-09-09: via INTRAVENOUS

## 2014-09-08 MED ORDER — PREGABALIN 75 MG PO CAPS
75.0000 mg | ORAL_CAPSULE | Freq: Every day | ORAL | Status: DC
  Administered 2014-09-09 – 2014-09-14 (×6): 75 mg via ORAL
  Filled 2014-09-08 (×6): qty 1

## 2014-09-08 MED ORDER — DOXAZOSIN MESYLATE 1 MG PO TABS
1.0000 mg | ORAL_TABLET | Freq: Every day | ORAL | Status: DC
  Administered 2014-09-09 – 2014-09-13 (×6): 1 mg via ORAL
  Filled 2014-09-08 (×8): qty 1

## 2014-09-08 MED ORDER — ACETAMINOPHEN 325 MG PO TABS
650.0000 mg | ORAL_TABLET | Freq: Four times a day (QID) | ORAL | Status: DC | PRN
  Administered 2014-09-09 – 2014-09-10 (×2): 650 mg via ORAL
  Filled 2014-09-08 (×2): qty 2

## 2014-09-08 MED ORDER — AMLODIPINE BESYLATE 10 MG PO TABS
10.0000 mg | ORAL_TABLET | Freq: Every day | ORAL | Status: DC
Start: 2014-09-09 — End: 2014-09-09

## 2014-09-08 MED ORDER — ONDANSETRON HCL 4 MG/2ML IJ SOLN
4.0000 mg | Freq: Three times a day (TID) | INTRAMUSCULAR | Status: DC | PRN
Start: 2014-09-08 — End: 2014-09-14

## 2014-09-08 MED ORDER — PIPERACILLIN-TAZOBACTAM IN DEX 2-0.25 GM/50ML IV SOLN
2.2500 g | Freq: Three times a day (TID) | INTRAVENOUS | Status: DC
  Administered 2014-09-09 – 2014-09-11 (×8): 2.25 g via INTRAVENOUS
  Filled 2014-09-08 (×10): qty 50

## 2014-09-08 MED ORDER — SENNOSIDES-DOCUSATE SODIUM 8.6-50 MG PO TABS
2.0000 | ORAL_TABLET | Freq: Two times a day (BID) | ORAL | Status: DC
  Administered 2014-09-09 – 2014-09-14 (×12): 2 via ORAL
  Filled 2014-09-08 (×12): qty 2

## 2014-09-08 MED ORDER — RENA-VITE PO TABS
1.0000 | ORAL_TABLET | Freq: Every day | ORAL | Status: DC
  Administered 2014-09-09 – 2014-09-13 (×6): 1 via ORAL
  Filled 2014-09-08 (×6): qty 1

## 2014-09-08 MED ORDER — CALCIUM ACETATE (PHOS BINDER) 667 MG PO CAPS
667.0000 mg | ORAL_CAPSULE | Freq: Three times a day (TID) | ORAL | Status: DC
  Administered 2014-09-09 – 2014-09-14 (×15): 667 mg via ORAL
  Filled 2014-09-08 (×16): qty 1

## 2014-09-08 MED ORDER — ACETAMINOPHEN 650 MG RE SUPP: 650.0000 mg | Freq: Four times a day (QID) | RECTAL | Status: DC | PRN

## 2014-09-08 MED ORDER — PIPERACILLIN-TAZOBACTAM 3.375 G IVPB 30 MIN
3.3750 g | Freq: Once | INTRAVENOUS | Status: AC
  Administered 2014-09-08: 3.375 g via INTRAVENOUS
  Filled 2014-09-08: qty 50

## 2014-09-08 MED ORDER — PRAVASTATIN SODIUM 20 MG PO TABS
20.0000 mg | ORAL_TABLET | Freq: Every day | ORAL | Status: DC
  Administered 2014-09-09 – 2014-09-14 (×6): 20 mg via ORAL
  Filled 2014-09-08 (×6): qty 1

## 2014-09-08 MED ORDER — SODIUM CHLORIDE 0.9 % IJ SOLN
3.0000 mL | Freq: Two times a day (BID) | INTRAMUSCULAR | Status: DC
  Administered 2014-09-09 – 2014-09-13 (×10): 3 mL via INTRAVENOUS

## 2014-09-08 NOTE — ED Notes (Signed)
Per EMS pt from home to be evaluated for subjective fever and chills starting this morning and wife sts patient was acting strange. Patient alert and oriented x4 but was telling wife he was supposed to be going out and not very oriented to situation as normal. Patient denies pain, nausea, vomiting. Pt is a dialysis patient and has diabetes. CBG was 488.

## 2014-09-08 NOTE — ED Provider Notes (Addendum)
CSN: [id number]     Arrival date & time 09/08/14  1951 History   First MD Initiated Contact with Patient 09/08/14 2029     Chief Complaint  Patient presents with  . Fever  . Altered Mental Status     (Consider location/radiation/quality/duration/timing/severity/associated sxs/prior Treatment) HPI.....[name] level V caveat for altered mental status. History obtained from wife and patient. Patient complains of fever and chills since this morning. Wife reports aberrant behavior. He appears to be confused at times, but other times his behavior was normal. He has multiple health problems including end-stage renal disease, coronary artery disease, diabetes, bilateral BKA's, and many others. He does urinate a small amount. No cough, chest pain, stiff neck.  Past Medical History  Diagnosis Date  . PAD (peripheral artery disease)     a. R CEA 2007. b. Hx L BKA in 2013. c. s/p LE angioplasty in 2014. d. Hx R BKA in 2014.  . Gangrene of toe     dry  . Neuromuscular disorder     diabetic neuropathy  . Hypertension   . Hyperlipidemia   . Peripheral neuropathy   . Constipation     takes Miralax daily  . Hemorrhoids   . Hx of colonic polyps   . Coronary artery disease     a. s/p NSTEMI/CABGx4 in 2007 - LIMA-LAD, SVG-optional diagonal, SVVG-OM, SVG-dRCA. b. Nuc 05/2011: nonischemic.  [name] BPH (benign prostatic hypertrophy)   . Abnormality of gait   . DVT (deep venous thrombosis)     a. Lower extremity DVT in 2013.  [name] Hypertrophy of prostate without urinary obstruction and other lower urinary tract symptoms (LUTS)   . Disorder of bone and cartilage, unspecified   . Lower limb amputation, below knee   . Anemia   . Secondary hyperparathyroidism     Secondary Hyperpara- Thyroidism, Renal  . Ischemic cardiomyopathy     a. EF 40% in 2007. b. 51% by nuc 05/2011.  [name] Complication of anesthesia     "he gets delirious"  . Myocardial infarction 2005  . Type II diabetes mellitus   . Diabetic peripheral  neuropathy   . ESRD (end stage renal disease)     "not ready yet for dialysis" (04/17/2014)  . Kidney stones   . Prostate cancer 2010   Past Surgical History  Procedure Laterality Date  . Prostatectomy  2009  . Knee cartilage surgery Left 1964  . Coronary artery bypass graft  2005    CABG X4  . Carotid endarterectomy Right 2005  . Cataract extraction w/ intraocular lens  implant, bilateral Bilateral 2011  . Colonoscopy    . Cardiac catheterization  05/28/11  . Amputation  06/14/2011    Procedure: AMPUTATION DIGIT;  Surgeon: Elam Dutch, MD;  Location[facility] OR;  Service: Vascular;  Laterality: Left;  Amputation Left fifth toe  . Amputation  06/16/2011    Procedure: AMPUTATION BELOW KNEE;  Surgeon: Elam Dutch, MD;  Location: [place];  Service: Vascular;  Laterality: Left;  . I&d extremity  01/31/2012    Procedure: IRRIGATION AND DEBRIDEMENT EXTREMITY;  Surgeon: Elam Dutch, MD;  Location: [place];  Service: Vascular;  Laterality: Left;  I & D Left BKA   . Amputation Right 06/12/2012    Procedure: AMPUTATION DIGIT;  Surgeon: Elam Dutch, MD;  Location: [facility] OR;  Service: Vascular;  Laterality: Right;  GREAT TOE  . Amputation Right 08/08/2012    Procedure: AMPUTATION BELOW KNEE;  Surgeon: Elam Dutch, MD;  Location: [facility] OR;  Service: Vascular;  Laterality: Right;  . Abdominal aortagram N/A 05/28/2011    Procedure: ABDOMINAL AORTAGRAM;  Surgeon: Elam Dutch, MD;  Location: [facility] CATH LAB;  Service: Cardiovascular;  Laterality: N/A;  . Abdominal aortagram N/A 06/02/2012    Procedure: ABDOMINAL Maxcine Ham;  Surgeon: Elam Dutch, MD;  Location: [facility] CATH LAB;  Service: Cardiovascular;  Laterality: N/A;  . Abdominal aortagram N/A 06/09/2012    Procedure: ABDOMINAL Maxcine Ham;  Surgeon: Elam Dutch, MD;  Location: [facility] CATH LAB;  Service: Cardiovascular;  Laterality: N/A;  . Insertion of dialysis catheter Right 04/19/2014    Procedure: INSERTION OF DIALYSIS CATHETER-INTERNAL JUGULAR;   Surgeon: Mal Misty, MD;  Location: [place];  Service: Vascular;  Laterality: Right;  . Av fistula placement Right 04/19/2014    Procedure: RIGHT ARM ARTERIOVENOUS (AV) FISTULA CREATION;  Surgeon: Mal Misty, MD;  Location: [place];  Service: Vascular;  Laterality: Right;  . Ligation of competing branches of arteriovenous fistula Right 06/19/2014    Procedure: Right Arm LIGATION OF COMPETING BRANCHES OF RADIOCEPHALIC ARTERIOVENOUS FISTULA;  Surgeon: Mal Misty, MD;  Location: [place];  Service: Vascular;  Laterality: Right;   Family History  Problem Relation Age of Onset  . Coronary artery disease Neg Hx   . Anesthesia problems Neg Hx   . Hypotension Neg Hx   . Malignant hyperthermia Neg Hx   . Pseudochol deficiency Neg Hx   . Hyperlipidemia Mother   . Hypertension Mother   . Cancer Mother   . Hyperlipidemia Father   . Hypertension Father   . Kidney disease Father   . Heart disease Sister   . Alzheimer's disease Sister   . Cancer Brother   . Other Sister    Social History  Substance Use Topics  . Smoking status: Former Smoker -- 1.00 packs/day for 30 years    Types: Cigarettes    Quit date: 04/28/1991  . Smokeless tobacco: Never Used  . Alcohol Use: Yes     Comment: 04/17/2014 "might have a glass of wine at Christmas"    Review of Systems  Unable to perform ROS: Mental status change      Allergies  Oxycodone  Home Medications   Prior to Admission medications   Medication Sig Start Date End Date Taking? Authorizing Provider  AMBULATORY NON FORMULARY MEDICATION BD U/P Mini Pen [place] 31GX5MM Use three times daily as directed DX:250.00 08/07/13   Blanchie Serve, MD  AMBULATORY NON FORMULARY MEDICATION One Touch Ultra 2 Test Strips Sig:  Check blood sugars Three times daily to keep blood sugars under control Dx: 250.00, 401.9 11/26/13   Blanchie Serve, MD  amLODipine (NORVASC) 10 MG tablet Take 1 tablet (10 mg total) by mouth daily. 05/03/14   Bary Leriche, PA-C   aspirin EC 81 MG tablet Take 1 tablet (81 mg total) by mouth every morning. 08/21/12   Lavon Paganini Angiulli, PA-C  calcium acetate (PHOSLO) 667 MG capsule Take 1 capsule (667 mg total) by mouth 3 (three) times daily with meals. 05/03/14   Bary Leriche, PA-C  ciprofloxacin (CIPRO) 500 MG tablet Take 1 tablet (500 mg total) by mouth daily. 07/12/14   Gildardo Cranker, DO  clopidogrel (PLAVIX) 75 MG tablet take 1 tablet by mouth once daily 02/11/14   Blanchie Serve, MD  doxazosin (CARDURA) 1 MG tablet take 1 tablet by mouth at bedtime 02/11/14   Blanchie Serve, MD  famotidine (PEPCID) 20 MG tablet Take 1 tablet (  20 mg total) by mouth at bedtime. 05/03/14   Bary Leriche, PA-C  hydrALAZINE (APRESOLINE) 100 MG tablet Take 1 tablet (100 mg total) by mouth every 8 (eight) hours. 06/24/14   [name], NP  HYDROcodone-acetaminophen (NORCO/VICODIN) 5-325 MG per tablet Take 1 tablet by mouth every 12 hours as needed for pain 08/14/14   Estill Dooms, MD  insulin aspart (NOVOLOG) 100 UNIT/ML FlexPen Inject 5 Units into the skin 3 (three) times daily with meals. Inject 5 units three times a day with meals if blood sugar greater than 150 only, DX: 250.71 08/07/13   Blanchie Serve, MD  Insulin Detemir (LEVEMIR) 100 UNIT/ML Pen Inject 15 Units into the skin 2 (two) times daily. 05/03/14   Bary Leriche, PA-C  Misc. Devices [facility] [place] Patient needs new Wheelchair due to his being broken. 01/09/14   Blanchie Serve, MD  multivitamin (RENA-VIT) TABS tablet Take 1 tablet by mouth at bedtime. 05/03/14   Ivan Anchors Love, PA-C  pravastatin (PRAVACHOL) 20 MG tablet Take 1 tablet (20 mg total) by mouth daily. 08/07/13   Blanchie Serve, MD  pregabalin (LYRICA) 75 MG capsule Take one tablet by mouth once daily for pains 08/30/14   Tiffany L Reed, DO  QUEtiapine (SEROQUEL) 25 MG tablet Take 1 tablet (25 mg total) by mouth daily. 05/03/14   Ivan Anchors Love, PA-C  senna-docusate (SENOKOT-S) 8.6-50 MG per tablet Take 2 tablets by mouth 2 (two)  times daily. 04/25/14   Florencia Reasons, MD   BP 140/67 mmHg  Pulse 92  Temp(Src) 102.6 F (39.2 C) (Rectal)  Resp 15  SpO2 97% Physical Exam  Constitutional: He is oriented to person, place, and time.  Pale, no acute distress, febrile  HENT:  Head: Normocephalic and atraumatic.  Eyes: Conjunctivae and EOM are normal. Pupils are equal, round, and reactive to light.  Neck: Normal range of motion. Neck supple.  Cardiovascular: Normal rate and regular rhythm.   Pulmonary/Chest: Effort normal and breath sounds normal.  Abdominal: Soft. Bowel sounds are normal.  Musculoskeletal: Normal range of motion.  Neurological: He is alert and oriented to person, place, and time.  Skin: Skin is warm and dry.  Bilateral BKA's  Psychiatric: He has a normal mood and affect. His behavior is normal.  Nursing note and vitals reviewed.   ED Course  Procedures (including critical care time) Labs Review Labs Reviewed  COMPREHENSIVE METABOLIC PANEL - Abnormal; Notable for the following:    Sodium 130 (*)    Chloride 98 (*)    CO2 20 (*)    Glucose, Bld 457 (*)    BUN 37 (*)    Creatinine, Ser 4.73 (*)    Calcium 8.4 (*)    Total Protein 5.9 (*)    Albumin 2.9 (*)    ALT 9 (*)    GFR calc non Af Amer 11 (*)    GFR calc Af Amer 13 (*)    All other components within normal limits  CBC WITH DIFFERENTIAL/PLATELET - Abnormal; Notable for the following:    RBC 3.41 (*)    Hemoglobin 10.3 (*)    HCT 31.3 (*)    All other components within normal limits  URINALYSIS, ROUTINE W REFLEX MICROSCOPIC (NOT AT [facility]) - Abnormal; Notable for the following:    Glucose, UA >1000 (*)    Protein, ur 100 (*)    All other components within normal limits  I-STAT CG4 LACTIC ACID, ED - Abnormal; Notable for the following:  Lactic Acid, Venous 2.44 (*)    All other components within normal limits  CULTURE, BLOOD (ROUTINE X 2)  CULTURE, BLOOD (ROUTINE X 2)  URINE CULTURE  URINE MICROSCOPIC-ADD ON    Imaging  Review Dg Chest 2 View  09/08/2014   CLINICAL DATA:  Fever of 2-3 days duration.  EXAM: CHEST  2 VIEW  COMPARISON:  04/19/2014  FINDINGS: There is a right internal jugular central line extending into the SVC. There is mild unchanged left hemidiaphragm elevation and mild unchanged linear left base scarring. The lungs are otherwise clear. The pulmonary vasculature is normal. There is no pleural effusion. There is prior sternotomy and CABG.  IMPRESSION: Mild linear left base scarring.  No acute cardiopulmonary findings.   Electronically Signed   By: Andreas Newport M.D.   On: 09/08/2014 21:08   I have personally reviewed and evaluated these images and lab results as part of my medical decision-making.   EKG Interpretation   Date/Time:  Sunday September 08 2014 21:47:07 EDT Ventricular Rate:  91 PR Interval:  164 QRS Duration: 88 QT Interval:  346 QTC Calculation: 426 R Axis:   -13 Text Interpretation:  Sinus rhythm Anteroseptal infarct, old Nonspecific T  abnormalities, lateral leads Confirmed by Ariann Khaimov  MD, Mindie Rawdon ([id number]) on  09/08/2014 9:51:02 PM     CRITICAL CARE Performed by: Nat Christen  ?  Total critical care time: 30  CHe is febrile.ritical care time was exclusive of separately billable procedures and treating other patients.  Critical care was necessary to treat or prevent imminent or life-threatening deterioration.  Critical care was time spent personally by me on the following activities: development of treatment plan with patient and/or surrogate as well as nursing, discussions with consultants, evaluation of patient's response to treatment, examination of patient, obtaining history from patient or surrogate, ordering and performing treatments and interventions, ordering and review of laboratory studies, ordering and review of radiographic studies, pulse oximetry and re-evaluation of patient's condition. MDM   Final diagnoses:  Fever and chills    Patient is reasonably alert.  He is febrile. Lactic acid 2.44.  Blood cultures obtained. Urinalysis and chest x-ray show no acute findings.    Nat Christen, MD 09/08/14 4580  Nat Christen, MD 09/08/14 2218

## 2014-09-08 NOTE — Progress Notes (Signed)
ANTIBIOTIC CONSULT NOTE - INITIAL  Pharmacy Consult for Vancomycin Indication: rule out sepsis  Allergies  Allergen Reactions  . Oxycodone Other (See Comments)    Hallucinations    Patient Measurements: Height: 5\' 8"  (172.7 cm) (Before amputation. ) Weight: 190 lb (86.183 kg) IBW/kg (Calculated) : 68.4 Adjusted Body Weight:   Vital Signs: Temp: 102.6 F (39.2 C) (08/28 2025) Temp Source: Rectal (08/28 2025) BP: 164/86 mmHg (08/28 2215) Pulse Rate: 102 (08/28 2215) Intake/Output from previous day:   Intake/Output from this shift:    Labs:  Recent Labs  09/08/14 2035  WBC 10.2  HGB 10.3*  PLT 168  CREATININE 4.73*   Estimated Creatinine Clearance: 14.6 mL/min (by C-G formula based on Cr of 4.73). No results for input(s): VANCOTROUGH, VANCOPEAK, VANCORANDOM, GENTTROUGH, GENTPEAK, GENTRANDOM, TOBRATROUGH, TOBRAPEAK, TOBRARND, AMIKACINPEAK, AMIKACINTROU, AMIKACIN in the last 72 hours.   Microbiology: No results found for this or any previous visit (from the past 720 hour(s)).  Medical History: Past Medical History  Diagnosis Date  . PAD (peripheral artery disease)     a. R CEA 2007. b. Hx L BKA in 2013. c. s/p LE angioplasty in 2014. d. Hx R BKA in 2014.  . Gangrene of toe     dry  . Neuromuscular disorder     diabetic neuropathy  . Hypertension   . Hyperlipidemia   . Peripheral neuropathy   . Constipation     takes Miralax daily  . Hemorrhoids   . Hx of colonic polyps   . Coronary artery disease     a. s/p NSTEMI/CABGx4 in 2007 - LIMA-LAD, SVG-optional diagonal, SVVG-OM, SVG-dRCA. b. Nuc 05/2011: nonischemic.  [name] BPH (benign prostatic hypertrophy)   . Abnormality of gait   . DVT (deep venous thrombosis)     a. Lower extremity DVT in 2013.  [name] Hypertrophy of prostate without urinary obstruction and other lower urinary tract symptoms (LUTS)   . Disorder of bone and cartilage, unspecified   . Lower limb amputation, below knee   . Anemia   . Secondary  hyperparathyroidism     Secondary Hyperpara- Thyroidism, Renal  . Ischemic cardiomyopathy     a. EF 40% in 2007. b. 51% by nuc 05/2011.  [name] Complication of anesthesia     "he gets delirious"  . Myocardial infarction 2005  . Type II diabetes mellitus   . Diabetic peripheral neuropathy   . ESRD (end stage renal disease)     "not ready yet for dialysis" (04/17/2014)  . Kidney stones   . Prostate cancer 2010    Medications:   (Not in a hospital admission) Scheduled:  . insulin aspart  5 Units Intravenous Once   Infusions:  . piperacillin-tazobactam     Assessment: 74yo male with ESRD not on HD presents with fever and AMS. Pharmacy is consulted to dose vancomycin for suspected sepsis. Pt is febrile to 102.6, WBC 10.2, sCr 4.73.  Goal of Therapy:  Vancomycin trough level 15-20 mcg/ml  Plan:  Vancomycin 1g IV q48h Measure antibiotic drug levels at steady state Follow up culture results, renal function and clinical course  Andrey Cota. Diona Foley, PharmD Clinical Pharmacist Pager [phone] 09/08/2014,10:26 PM

## 2014-09-08 NOTE — ED Notes (Signed)
CBG 363 

## 2014-09-08 NOTE — ED Notes (Signed)
Phlebotomy at the bedside  

## 2014-09-08 NOTE — ED Notes (Signed)
Hospitalist at the bedside. MD Nui.

## 2014-09-08 NOTE — ED Notes (Signed)
Patient transported to CT 

## 2014-09-08 NOTE — ED Notes (Signed)
Patient returned from X-ray 

## 2014-09-08 NOTE — ED Notes (Signed)
Patient being transport upstairs.

## 2014-09-08 NOTE — Progress Notes (Signed)
Finished getting report from ED RN. Room ready for admit.

## 2014-09-08 NOTE — H&P (Addendum)
[facility] History and Physical  Aaron Long DDU:[id number] DOB: [birth year] DOA: 09/08/2014  Referring physician: ED physician PCP: [name], NP  Specialists:   Chief Complaint: Fever, chills and confusion  HPI: Aaron Long is a 74 y.o. male with PMH of ESRD-HD (MWF), CAD (s/p of CABG), hypertension, hyperlipidemia, diastolic congestive heart failure (EF of 60-65%), GERD, depression, carotid artery stenosis (s/p of right carotid artery endarterectomy), DVT, prostate cancer (s/p of surgery), BPH, bilateral BKA, who presents with fever, chills, confusion.  Patient reports that he started having fever and chills since this morning, but no other symptoms, such as cough, runny nose, sore throat, chest pain, abdominal pain, diarrhea, symptoms of UTI, skin infection, ear pain, headache, neck rigidity, photophobia. No tenderness over his dialysis catheter in  right upper chest. His wife reports that the patient was mildly confused, which has already resolved in the emergency room. She does not have rashes, unilateral weakness.  In ED, patient was found to have temperature 102.6, lactate 2.44, WBC 10.2, tachycardia, negative urinalysis, creatinine 4.73, BUN 37, negative chest x-ray for acute abnormalities. Patient is admitted to inpatient for further eval and treatment.  Where does patient live?   At home   Can patient participate in ADLs?  Little  Review of Systems:   General: has fevers, chills, no changes in body weight, has fatigue HEENT: no blurry vision, hearing changes or sore throat Pulm: no dyspnea, coughing, wheezing CV: no chest pain, palpitations Abd: no nausea, vomiting, abdominal pain, diarrhea, constipation GU: no dysuria, burning on urination, increased urinary frequency, hematuria  Ext: no leg edema Neuro: no unilateral weakness, numbness, or tingling, no vision change or hearing loss Skin: no rash MSK: No muscle spasm, no deformity, no limitation of  range of movement in spin Heme: No easy bruising.  Travel history: No recent long distant travel.  Allergy:  Allergies  Allergen Reactions  . Oxycodone Other (See Comments)    Hallucinations    Past Medical History  Diagnosis Date  . PAD (peripheral artery disease)     a. R CEA 2007. b. Hx L BKA in 2013. c. s/p LE angioplasty in 2014. d. Hx R BKA in 2014.  . Gangrene of toe     dry  . Neuromuscular disorder     diabetic neuropathy  . Hypertension   . Hyperlipidemia   . Peripheral neuropathy   . Constipation     takes Miralax daily  . Hemorrhoids   . Hx of colonic polyps   . Coronary artery disease     a. s/p NSTEMI/CABGx4 in 2007 - LIMA-LAD, SVG-optional diagonal, SVVG-OM, SVG-dRCA. b. Nuc 05/2011: nonischemic.  [name] BPH (benign prostatic hypertrophy)   . Abnormality of gait   . DVT (deep venous thrombosis)     a. Lower extremity DVT in 2013.  [name] Hypertrophy of prostate without urinary obstruction and other lower urinary tract symptoms (LUTS)   . Disorder of bone and cartilage, unspecified   . Lower limb amputation, below knee   . Anemia   . Secondary hyperparathyroidism     Secondary Hyperpara- Thyroidism, Renal  . Ischemic cardiomyopathy     a. EF 40% in 2007. b. 51% by nuc 05/2011.  [name] Complication of anesthesia     "he gets delirious"  . Myocardial infarction 2005  . Type II diabetes mellitus   . Diabetic peripheral neuropathy   . ESRD on dialysis   . Kidney stones   . Prostate cancer 2010  Past Surgical History  Procedure Laterality Date  . Prostatectomy  2009  . Knee cartilage surgery Left 1964  . Coronary artery bypass graft  2005    CABG X4  . Carotid endarterectomy Right 2005  . Cataract extraction w/ intraocular lens  implant, bilateral Bilateral 2011  . Colonoscopy    . Cardiac catheterization  05/28/11  . Amputation  06/14/2011    Procedure: AMPUTATION DIGIT;  Surgeon: Elam Dutch, MD;  Location: [facility] OR;  Service: Vascular;  Laterality: Left;   Amputation Left fifth toe  . Amputation  06/16/2011    Procedure: AMPUTATION BELOW KNEE;  Surgeon: Elam Dutch, MD;  Location: [place];  Service: Vascular;  Laterality: Left;  . I&d extremity  01/31/2012    Procedure: IRRIGATION AND DEBRIDEMENT EXTREMITY;  Surgeon: Elam Dutch, MD;  Location: [place];  Service: Vascular;  Laterality: Left;  I & D Left BKA   . Amputation Right 06/12/2012    Procedure: AMPUTATION DIGIT;  Surgeon: Elam Dutch, MD;  Location: [facility] OR;  Service: Vascular;  Laterality: Right;  GREAT TOE  . Amputation Right 08/08/2012    Procedure: AMPUTATION BELOW KNEE;  Surgeon: Elam Dutch, MD;  Location: [facility] OR;  Service: Vascular;  Laterality: Right;  . Abdominal aortagram N/A 05/28/2011    Procedure: ABDOMINAL Maxcine Ham;  Surgeon: Elam Dutch, MD;  Location[facility] C[facility] LAB;  Service: Cardiovascular;  Laterality: N/A;  . Abdominal aortagram N/A 06/02/2012    Procedure: ABDOMINAL Maxcine Ham;  Surgeon: Elam Dutch, MD;  Location: [facility] CATH LAB;  Service: Cardiovascular;  Laterality: N/A;  . Abdominal aortagram N/A 06/09/2012    Procedure: ABDOMINAL Maxcine Ham;  Surgeon: Elam Dutch, MD;  Location: [facility] CATH LAB;  Service: Cardiovascular;  Laterality: N/A;  . Insertion of dialysis catheter Right 04/19/2014    Procedure: INSERTION OF DIALYSIS CATHETER-INTERNAL JUGULAR;  Surgeon: Mal Misty, MD;  Location: [place];  Service: Vascular;  Laterality: Right;  . Av fistula placement Right 04/19/2014    Procedure: RIGHT ARM ARTERIOVENOUS (AV) FISTULA CREATION;  Surgeon: Mal Misty, MD;  Location: [place];  Service: Vascular;  Laterality: Right;  . Ligation of competing branches of arteriovenous fistula Right 06/19/2014    Procedure: Right Arm LIGATION OF COMPETING BRANCHES OF RADIOCEPHALIC ARTERIOVENOUS FISTULA;  Surgeon: Mal Misty, MD;  Location: [place];  Service: Vascular;  Laterality: Right;    Social History:  reports that he quit smoking about 23 years ago. His smoking use  included Cigarettes. He has a 30 pack-year smoking history. He has never used smokeless tobacco. He reports that he drinks alcohol. He reports that he does not use illicit drugs.  Family History:  Family History  Problem Relation Age of Onset  . Coronary artery disease Neg Hx   . Anesthesia problems Neg Hx   . Hypotension Neg Hx   . Malignant hyperthermia Neg Hx   . Pseudochol deficiency Neg Hx   . Hyperlipidemia Mother   . Hypertension Mother   . Cancer Mother   . Hyperlipidemia Father   . Hypertension Father   . Kidney disease Father   . Heart disease Sister   . Alzheimer's disease Sister   . Cancer Brother   . Other Sister      Prior to Admission medications   Medication Sig Start Date End Date Taking? Authorizing Provider  AMBULATORY NON FORMULARY MEDICATION BD U/P Mini Pen [place] 31GX5MM Use three times daily as directed DX:250.00 08/07/13  Blanchie Serve, MD  AMBULATORY NON FORMULARY MEDICATION One Touch Ultra 2 Test Strips Sig:  Check blood sugars Three times daily to keep blood sugars under control Dx: 250.00, 401.9 11/26/13   Blanchie Serve, MD  amLODipine (NORVASC) 10 MG tablet Take 1 tablet (10 mg total) by mouth daily. 05/03/14   Bary Leriche, PA-C  aspirin EC 81 MG tablet Take 1 tablet (81 mg total) by mouth every morning. 08/21/12   Lavon Paganini Angiulli, PA-C  calcium acetate (PHOSLO) 667 MG capsule Take 1 capsule (667 mg total) by mouth 3 (three) times daily with meals. 05/03/14   Bary Leriche, PA-C  ciprofloxacin (CIPRO) 500 MG tablet Take 1 tablet (500 mg total) by mouth daily. 07/12/14   Gildardo Cranker, DO  clopidogrel (PLAVIX) 75 MG tablet take 1 tablet by mouth once daily 02/11/14   Blanchie Serve, MD  doxazosin (CARDURA) 1 MG tablet take 1 tablet by mouth at bedtime 02/11/14   Blanchie Serve, MD  famotidine (PEPCID) 20 MG tablet Take 1 tablet (20 mg total) by mouth at bedtime. 05/03/14   Bary Leriche, PA-C  hydrALAZINE (APRESOLINE) 100 MG tablet Take 1 tablet (100 mg  total) by mouth every 8 (eight) hours. 06/24/14   [name], NP  HYDROcodone-acetaminophen (NORCO/VICODIN) 5-325 MG per tablet Take 1 tablet by mouth every 12 hours as needed for pain 08/14/14   Estill Dooms, MD  insulin aspart (NOVOLOG) 100 UNIT/ML FlexPen Inject 5 Units into the skin 3 (three) times daily with meals. Inject 5 units three times a day with meals if blood sugar greater than 150 only, DX: 250.71 08/07/13   Blanchie Serve, MD  Insulin Detemir (LEVEMIR) 100 UNIT/ML Pen Inject 15 Units into the skin 2 (two) times daily. 05/03/14   Bary Leriche, PA-C  Misc. Devices [facility] [place] Patient needs new Wheelchair due to his being broken. 01/09/14   Blanchie Serve, MD  multivitamin (RENA-VIT) TABS tablet Take 1 tablet by mouth at bedtime. 05/03/14   Ivan Anchors Love, PA-C  pravastatin (PRAVACHOL) 20 MG tablet Take 1 tablet (20 mg total) by mouth daily. 08/07/13   Blanchie Serve, MD  pregabalin (LYRICA) 75 MG capsule Take one tablet by mouth once daily for pains 08/30/14   Tiffany L Reed, DO  QUEtiapine (SEROQUEL) 25 MG tablet Take 1 tablet (25 mg total) by mouth daily. 05/03/14   Ivan Anchors Love, PA-C  senna-docusate (SENOKOT-S) 8.6-50 MG per tablet Take 2 tablets by mouth 2 (two) times daily. 04/25/14   Florencia Reasons, MD    Physical Exam: Filed Vitals:   09/08/14 2200 09/08/14 2215 09/08/14 2217 09/08/14 2230  BP: 128/59 164/86  150/65  Pulse: 91 102  91  Temp:      TempSrc:      Resp: 14 18  15   Height:   5\' 8"  (1.727 m)   Weight:   86.183 kg (190 lb)   SpO2: 95% 97%  99%   General: Not in acute distress HEENT:       Eyes: PERRL, EOMI, no scleral icterus.       ENT: No discharge from the ears and nose, no pharynx injection, no tonsillar enlargement.        Neck: No JVD, no bruit, no mass felt. Heme: No neck lymph node enlargement. Cardiac: S1/S2, RRR, No murmurs, No gallops or rubs. Pulm:  No rales, wheezing, rhonchi or rubs. Has HD cath over R upper chest with clean surroundings, without  tenderness. Abd:  Soft, nondistended, nontender, no rebound pain, no organomegaly, BS present. Ext: No pitting leg edema bilaterally. AVF over R arm with good bruit. bilateraly BKA. Musculoskeletal: No joint deformities, No joint redness or warmth, no limitation of ROM in spin. Skin: No rashes.  Neuro: Alert, oriented X3, cranial nerves II-XII grossly intact, muscle strength 5/5 in all extremities, sensation to light touch intact.  Psych: Patient is not psychotic, no suicidal or hemocidal ideation.  Labs on Admission:  Basic Metabolic Panel:  Recent Labs Lab 09/08/14 2035  NA 130*  K 4.7  CL 98*  CO2 20*  GLUCOSE 457*  BUN 37*  CREATININE 4.73*  CALCIUM 8.4*   Liver Function Tests:  Recent Labs Lab 09/08/14 2035  AST 16  ALT 9*  ALKPHOS 108  BILITOT 0.5  PROT 5.9*  ALBUMIN 2.9*   No results for input(s): LIPASE, AMYLASE in the last 168 hours. No results for input(s): AMMONIA in the last 168 hours. CBC:  Recent Labs Lab 09/08/14 2035  WBC 10.2  NEUTROABS 7.6  HGB 10.3*  HCT 31.3*  MCV 91.8  PLT 168   Cardiac Enzymes: No results for input(s): CKTOTAL, CKMB, CKMBINDEX, TROPONINI in the last 168 hours.  BNP (last 3 results) No results for input(s): BNP in the last 8760 hours.  ProBNP (last 3 results) No results for input(s): PROBNP in the last 8760 hours.  CBG:  Recent Labs Lab 09/08/14 2233  GLUCAP 363*    Radiological Exams on Admission: Dg Chest 2 View  09/08/2014   CLINICAL DATA:  Fever of 2-3 days duration.  EXAM: CHEST  2 VIEW  COMPARISON:  04/19/2014  FINDINGS: There is a right internal jugular central line extending into the SVC. There is mild unchanged left hemidiaphragm elevation and mild unchanged linear left base scarring. The lungs are otherwise clear. The pulmonary vasculature is normal. There is no pleural effusion. There is prior sternotomy and CABG.  IMPRESSION: Mild linear left base scarring.  No acute cardiopulmonary findings.    Electronically Signed   By: Andreas Newport M.D.   On: 09/08/2014 21:08    EKG: Independently reviewed.  Abnormal findings: LAE, mild T-wave inversion in V5-V6   Assessment/Plan Principal Problem:   Sepsis Active Problems:   HTN (hypertension)   CAD (coronary artery disease)   PAD (peripheral artery disease)   Prostate cancer   Diabetic neuropathy, type II diabetes mellitus   Anemia in chronic renal disease   GERD (gastroesophageal reflux disease)   Hyperlipidemia with target LDL less than 100   DM type 2, uncontrolled, with renal complications   Cervical disc disorder with radiculopathy of cervical region   Status post bilateral below knee amputation   Anemia of chronic disease   Fever and chills   ESRD on dialysis   Acute encephalopathy   Sepsis: Patient has sepsis with fever, tachycardia and elevated lactate. Etiology is not clear. No source of infection could be identified. No signs of infection in dialysis catheter or AV fistula. Negative chest x-ray and urinalysis on admission. Currently hemodynamically stable.  - will admit to tele bed - Empiric antimicrobial treatment with vancomycin and Zosyn per pharmacy - Blood cultures x 2 and Ux - will get Procalcitonin and trend lactic acid levels per sepsis protocol. - IVF: 1.5L of NS bolus in ED, followed by 75 cc/h (patient has congestive heart failure and ESRD, limiting aggressive IV fluids treatment). - prn Zofran for nausea  Acute encephalopathy: Patient had mild confusion, which has resolved. Most  likely due to sepsis. -CT head was ordered by ED, will follow-up the results. -Frequent neuro check -T2 sepsis as above  HTN (hypertension) -Continue amlodipine -Hold oral hydralazine since patient is at risk of developing hypotension -IV hydralazine when necessary  CAD: s/p of CABG. No chest pain. -continue aspirin, Plavix, pravastatin  Prostate cancer: s/p surgery. No acute issues. -On Cardura  DM-II: Last A1c 7.2,  fairly well controled. Patient is taking levemir at home. Blood sugar was elevated at 457 on admission, likely due to infection.  -5 U of NovoLog was given emergency room -will inecrease Lantus dose from  74 yo 18 units bid -SSI -Continue Lyrica for neuropathy  GERD: -Pepcid  Anemia in chronic renal disease: hgb stable. -follow up by CBC  HLD: Last LDL was 97 on 04/02/14 -Continue home medications: Pravastatin  Cervical disc disorder with radiculopathy of cervical region:  -prn Norco  ESRD on dialysis (MWF): Creatinine 4.73, BUN 37. Patient has been compliant to dialysis. -left messages to renal for dialysis tomorrow  Diastolic congestive heart failure: 2-D echo 04/09/14 showed EF of 60-65% with discrete 1 diastolic dysfunction. CHF is compensated on admission. Patient does not have fluid overload. -Managed by dialysis per renal  DVT ppx: SQ Heparin     Code Status: Full code Family Communication:   Yes, patient's wife and daguhter at bed side Disposition Plan: Admit to inpatient   Date of Service 09/08/2014    Ivor Costa [facility] Pager [phone]  If 7PM-7AM, please contact night-coverage [email or web address] Password [email or web address] 09/08/2014, 11:13 PM

## 2014-09-08 NOTE — ED Notes (Signed)
EMS gave 650 mg of tylenol.

## 2014-09-08 NOTE — ED Notes (Signed)
Patient returned from CT

## 2014-09-09 ENCOUNTER — Encounter: Payer: Medicare Other | Admitting: Physical Therapy

## 2014-09-09 ENCOUNTER — Other Ambulatory Visit: Payer: Self-pay | Admitting: *Deleted

## 2014-09-09 DIAGNOSIS — I2510 Atherosclerotic heart disease of native coronary artery without angina pectoris: Secondary | ICD-10-CM

## 2014-09-09 DIAGNOSIS — C61 Malignant neoplasm of prostate: Secondary | ICD-10-CM

## 2014-09-09 DIAGNOSIS — D638 Anemia in other chronic diseases classified elsewhere: Secondary | ICD-10-CM

## 2014-09-09 DIAGNOSIS — K219 Gastro-esophageal reflux disease without esophagitis: Secondary | ICD-10-CM

## 2014-09-09 DIAGNOSIS — I10 Essential (primary) hypertension: Secondary | ICD-10-CM

## 2014-09-09 DIAGNOSIS — E785 Hyperlipidemia, unspecified: Secondary | ICD-10-CM

## 2014-09-09 LAB — CBC
HEMATOCRIT: 33.3 % — AB (ref 39.0–52.0)
Hemoglobin: 10.6 g/dL — ABNORMAL LOW (ref 13.0–17.0)
MCH: 29.6 pg (ref 26.0–34.0)
MCHC: 31.8 g/dL (ref 30.0–36.0)
MCV: 93 fL (ref 78.0–100.0)
PLATELETS: 177 10*3/uL (ref 150–400)
RBC: 3.58 MIL/uL — ABNORMAL LOW (ref 4.22–5.81)
RDW: 15.5 % (ref 11.5–15.5)
WBC: 9 10*3/uL (ref 4.0–10.5)

## 2014-09-09 LAB — RAPID URINE DRUG SCREEN, HOSP PERFORMED
Amphetamines: NOT DETECTED
BENZODIAZEPINES: NOT DETECTED
Barbiturates: NOT DETECTED
COCAINE: NOT DETECTED
OPIATES: NOT DETECTED
Tetrahydrocannabinol: NOT DETECTED

## 2014-09-09 LAB — RENAL FUNCTION PANEL
ANION GAP: 7 (ref 5–15)
Albumin: 2.9 g/dL — ABNORMAL LOW (ref 3.5–5.0)
BUN: 38 mg/dL — ABNORMAL HIGH (ref 6–20)
CALCIUM: 9.3 mg/dL (ref 8.9–10.3)
CHLORIDE: 104 mmol/L (ref 101–111)
CO2: 22 mmol/L (ref 22–32)
Creatinine, Ser: 4.69 mg/dL — ABNORMAL HIGH (ref 0.61–1.24)
GFR, EST AFRICAN AMERICAN: 13 mL/min — AB (ref >60)
GFR, EST NON AFRICAN AMERICAN: 11 mL/min — AB (ref >60)
Glucose, Bld: 146 mg/dL — ABNORMAL HIGH (ref 65–99)
Phosphorus: 3.9 mg/dL (ref 2.5–4.6)
Potassium: 4.4 mmol/L (ref 3.5–5.1)
Sodium: 133 mmol/L — ABNORMAL LOW (ref 135–145)

## 2014-09-09 LAB — BASIC METABOLIC PANEL
Anion gap: 48 — ABNORMAL HIGH (ref 5–15)
BUN: 32 mg/dL — AB (ref 6–20)
CALCIUM: 5.4 mg/dL — AB (ref 8.9–10.3)
CO2: 16 mmol/L — ABNORMAL LOW (ref 22–32)
CREATININE: 4.11 mg/dL — AB (ref 0.61–1.24)
Chloride: 86 mmol/L — ABNORMAL LOW (ref 101–111)
GFR calc Af Amer: 15 mL/min — ABNORMAL LOW (ref >60)
GFR, EST NON AFRICAN AMERICAN: 13 mL/min — AB (ref >60)
Glucose, Bld: 356 mg/dL — ABNORMAL HIGH (ref 65–99)
POTASSIUM: 4.1 mmol/L (ref 3.5–5.1)
SODIUM: 150 mmol/L — AB (ref 135–145)

## 2014-09-09 LAB — CBC WITH DIFFERENTIAL/PLATELET
BASOS ABS: 0 10*3/uL (ref 0.0–0.1)
Basophils Relative: 0 % (ref 0–1)
Eosinophils Absolute: 0.1 10*3/uL (ref 0.0–0.7)
Eosinophils Relative: 1 % (ref 0–5)
HCT: 31.5 % — ABNORMAL LOW (ref 39.0–52.0)
HEMOGLOBIN: 10.1 g/dL — AB (ref 13.0–17.0)
LYMPHS ABS: 1.6 10*3/uL (ref 0.7–4.0)
LYMPHS PCT: 18 % (ref 12–46)
MCH: 29.8 pg (ref 26.0–34.0)
MCHC: 32.1 g/dL (ref 30.0–36.0)
MCV: 92.9 fL (ref 78.0–100.0)
Monocytes Absolute: 0.5 10*3/uL (ref 0.1–1.0)
Monocytes Relative: 6 % (ref 3–12)
NEUTROS ABS: 6.7 10*3/uL (ref 1.7–7.7)
NEUTROS PCT: 75 % (ref 43–77)
Platelets: 185 10*3/uL (ref 150–400)
RBC: 3.39 MIL/uL — AB (ref 4.22–5.81)
RDW: 15.5 % (ref 11.5–15.5)
WBC: 9 10*3/uL (ref 4.0–10.5)

## 2014-09-09 LAB — APTT: aPTT: 32 seconds (ref 24–37)

## 2014-09-09 LAB — LACTIC ACID, PLASMA
LACTIC ACID, VENOUS: 0.7 mmol/L (ref 0.5–2.0)
LACTIC ACID, VENOUS: 1.7 mmol/L (ref 0.5–2.0)

## 2014-09-09 LAB — PROCALCITONIN: PROCALCITONIN: 1.58 ng/mL

## 2014-09-09 LAB — GLUCOSE, CAPILLARY
GLUCOSE-CAPILLARY: 146 mg/dL — AB (ref 65–99)
Glucose-Capillary: 300 mg/dL — ABNORMAL HIGH (ref 65–99)
Glucose-Capillary: 161 mg/dL — ABNORMAL HIGH (ref 65–99)

## 2014-09-09 LAB — PROTIME-INR
INR: 1.05 (ref 0.00–1.49)
PROTHROMBIN TIME: 13.9 s (ref 11.6–15.2)

## 2014-09-09 LAB — MRSA PCR SCREENING: MRSA by PCR: NEGATIVE

## 2014-09-09 MED ORDER — VANCOMYCIN HCL IN DEXTROSE 1-5 GM/200ML-% IV SOLN
1000.0000 mg | INTRAVENOUS | Status: DC
  Filled 2014-09-09: qty 200

## 2014-09-09 MED ORDER — LIDOCAINE HCL (PF) 2 % IJ SOLN
0.0000 mL | Freq: Once | INTRAMUSCULAR | Status: DC | PRN
  Filled 2014-09-09: qty 20

## 2014-09-09 MED ORDER — AMBULATORY NON FORMULARY MEDICATION: Status: DC

## 2014-09-09 MED ORDER — AMLODIPINE BESYLATE 10 MG PO TABS
10.0000 mg | ORAL_TABLET | Freq: Every day | ORAL | Status: DC
  Administered 2014-09-09 – 2014-09-13 (×5): 10 mg via ORAL
  Filled 2014-09-09 (×5): qty 1

## 2014-09-09 MED ORDER — VANCOMYCIN HCL IN DEXTROSE 750-5 MG/150ML-% IV SOLN
750.0000 mg | Freq: Once | INTRAVENOUS | Status: DC
  Filled 2014-09-09: qty 150

## 2014-09-09 MED ORDER — SODIUM CHLORIDE 0.9 % IV SOLN
1750.0000 mg | Freq: Once | INTRAVENOUS | Status: AC
  Administered 2014-09-09: 1750 mg via INTRAVENOUS
  Filled 2014-09-09: qty 1750

## 2014-09-09 MED ORDER — ONETOUCH DELICA LANCETS 33G MISC: Status: DC

## 2014-09-09 NOTE — Progress Notes (Addendum)
ANTIBIOTIC CONSULT NOTE -follow up Pharmacy Consult for Vancomycin/zosyn Indication: rule out sepsis  Allergies  Allergen Reactions  . Oxycodone Other (See Comments)    Hallucinations    Patient Measurements: Height: 5\' 8"  (172.7 cm) (Before amputation. ) Weight: 185 lb (83.915 kg) IBW/kg (Calculated) : 68.4   Vital Signs: Temp: 100.3 F (37.9 C) (08/29 0845) Temp Source: Oral (08/29 0845) BP: 153/64 mmHg (08/29 0845) Pulse Rate: 103 (08/29 0845) Intake/Output from previous day: 08/28 0701 - 08/29 0700 In: 668.8 [I.V.:418.8; IV Piggyback:250] Out: 250 [Urine:250] Intake/Output from this shift: Total I/O In: 427.5 [P.O.:240; I.V.:187.5] Out: 150 [Urine:150]  Labs:  Recent Labs  09/08/14 2035 09/09/14 0250  WBC 10.2 9.0  HGB 10.3* 10.6*  PLT 168 177  CREATININE 4.73* 4.11*   Estimated Creatinine Clearance: 16.6 mL/min (by C-G formula based on Cr of 4.11). No results for input(s): VANCOTROUGH, VANCOPEAK, VANCORANDOM, GENTTROUGH, GENTPEAK, GENTRANDOM, TOBRATROUGH, TOBRAPEAK, TOBRARND, AMIKACINPEAK, AMIKACINTROU, AMIKACIN in the last 72 hours.   Microbiology: Recent Results (from the past 720 hour(s))  MRSA PCR Screening     Status: None   Collection Time: 09/09/14  5:27 AM  Result Value Ref Range Status   MRSA by PCR NEGATIVE NEGATIVE Final    Comment:        The GeneXpert MRSA Assay (FDA approved for NASAL specimens only), is one component of a comprehensive MRSA colonization surveillance program. It is not intended to diagnose MRSA infection nor to guide or monitor treatment for MRSA infections.      Assessment: 74yo male with ESRD on HD MWF presented with fever and AMS. Pharmacy consulted to dose vancomycin/zosyn for suspected sepsis. CXR neg; UA neg.  He was given vanc 1 gm 8/28 at 2307. WBC 10.2>9; Tm 100.3; using AVF past 3 treatments,to use AVF today and if OK, VVS to dc permcath - could be source of fever.  Wt ~ 84 kg   Vanc 8/28>> Zosyn  8/28>>  8/28 BCx2>> 8/28 Ucx>>   Goal of Therapy:  Pre-HD vanc level 15-25 mcg/ml  Plan:  -vancomycin 1750 mg IV after HD today to load then vancomycin 1 gm IV after HD on MWF -continue zosyn 2.25 gm IV q8h - f/u culture data -pre-HD vanc level as needed  Eudelia Bunch, Pharm.D. [phone] 09/09/2014 12:09 PM

## 2014-09-09 NOTE — Telephone Encounter (Signed)
[facility] [place] 

## 2014-09-09 NOTE — Evaluation (Signed)
Physical Therapy Evaluation Patient Details Name: Aaron Long MRN: [id number] DOB: [birth year] Today's Date: 09/09/2014   History of Present Illness  74 yo admitted with confusion and fever PMH: bil BKA with prothesis HTN, CAD s/p CABG, CHF, GERD, Depression, prostate CA, PAD  Clinical Impression  Patient presents with problems listed below.  Will benefit from acute PT to maximize functional mobility prior to discharge.  Wife to bring in prostheses to allow complete evaluation of mobility.  Today, patient required max assist for bed mobility and sitting.  Was transferring on his own at home.  Recommend SNF at discharge for continued therapy prior to return home.    Follow Up Recommendations SNF;Supervision/Assistance - 24 hour    Equipment Recommendations  None recommended by PT    Recommendations for Other Services       Precautions / Restrictions Precautions Precautions: Fall Precaution Comments: Bil BKA Required Braces or Orthoses: Other Brace/Splint Other Brace/Splint: Has bil prostheses - wife to bring in Restrictions Weight Bearing Restrictions: No      Mobility  Bed Mobility Overal bed mobility: Needs Assistance Bed Mobility: Rolling;Sidelying to Sit;Sit to Supine Rolling: Min assist (Heavy use of rail) Sidelying to sit: Max assist Sit to supine: Mod assist   General bed mobility comments: Patient attempted to move to sitting - unable to lift trunk from bed.  Required max assist to bring trunk to sitting position.  Patient required max assist to maintain sitting balance, with posterior lean.  Sat EOB x 5 minutes.  Returned to supine with mod assist to control trunk and shift hips on bed.  Transfers                 General transfer comment:   Ambulation/Gait                Stairs            Wheelchair Mobility    Modified Rankin (Stroke Patients Only)       Balance Overall balance assessment: Needs assistance Sitting-balance  support: Bilateral upper extremity supported Sitting balance-Leahy Scale: Zero Sitting balance - Comments: Patient unable to maintain static balance in sitting, requiring max assist. Postural control: Posterior lean                                   Pertinent Vitals/Pain Pain Assessment: No/denies pain Faces Pain Scale:  Pain Location:  Pain Intervention(s):     Home Living Family/patient expects to be discharged to:: Private residence Living Arrangements: Spouse/significant other (Nephew) Available Help at Discharge: Family;Available PRN/intermittently Type of Home: Apartment Home Access: Level entry     Home Layout: One level Home Equipment: Wheelchair - [employer] - 2 wheels;Other (comment)      Prior Function Level of Independence: Needs assistance   Gait / Transfers Assistance Needed: Patient reports he uses prostheses for transfers and that he could transfer without assist pta.  ADL's / Homemaking Assistance Needed:         Hand Dominance   Dominant Hand: Left    Extremity/Trunk Assessment   Upper Extremity Assessment: Generalized weakness (LUE shaking while holding phone; RUE "no good" per pt)           Lower Extremity Assessment: Generalized weakness (Bil BKA - strength at least 3/5)    Communication   Communication: No difficulties  Cognition Arousal/Alertness: Awake/alert Behavior During Therapy: WFL for tasks assessed/performed;Flat affect Overall Cognitive Status:  Impaired/Different from baseline Area of Impairment: Orientation;Memory;Awareness;Problem solving Orientation Level: Disoriented to;Situation;Time   Memory: Decreased short-term memory     Problem Solving: Slow processing;Difficulty sequencing;Requires verbal cues General Comments:     General Comments General comments (skin integrity, edema, etc.):     Exercises        Assessment/Plan    PT Assessment Patient needs continued PT services  PT Diagnosis  Generalized weakness;Altered mental status   PT Problem List Decreased strength;Decreased activity tolerance;Decreased balance;Decreased mobility;Decreased cognition  PT Treatment Interventions DME instruction;Functional mobility training;Therapeutic activities;Therapeutic exercise;Balance training;Cognitive remediation;Patient/family education   PT Goals (Current goals can be found in the Care Plan section) Acute Rehab PT Goals Patient Stated Goal: to get stronger PT Goal Formulation: With patient Time For Goal Achievement: 09/23/14 Potential to Achieve Goals: Good    Frequency Min 3X/week   Barriers to discharge        Co-evaluation               End of Session   Activity Tolerance: Patient limited by fatigue Patient left: in bed;with call bell/phone within reach;with bed alarm set;with family/visitor present Nurse Communication: Mobility status         Time: 1208-1231 PT Time Calculation (min) (ACUTE ONLY): 23 min   Charges:   PT Evaluation $Initial PT Evaluation Tier I: 1 Procedure PT Treatments $Therapeutic Activity: 8-22 mins   PT G Codes:        [name] 2014-10-02, 1:13 PM [name]. Sanjuana Kava, [name] Pager [phone]

## 2014-09-09 NOTE — Progress Notes (Signed)
Utilization review completed. Yury Schaus, RN, BSN. 

## 2014-09-09 NOTE — Consult Note (Signed)
[facility] Renal Consultation Note  Indication for Consultation:  Management of ESRD/hemodialysis; anemia, hypertension/volume and secondary hyperparathyroidism  HPI: Aaron Long is a 74 y.o. male admitted yesterday with febrile illness Temp.102.6 in ER ,WBC 10.2, tachycardia, negative urinalysis, and  negative chest x-ray for acute abnormalities. At home wife reported chills and fever. He has been compliant with His Hemodialysis = Mon, Wed, Fri and using his AVF past 3 treatments per [facility] RN.Had small infiltration last week but use and 08/16/14 Outpt Angioplasty of AVF at [facility]. This am pt has no co oriented X3. He has HO CAD (s/p of CABG), HTN, hyperlipidemia, diastolic congestive heart failure (EF of 60-65%), GERD, depression, carotid artery stenosis (s/p of right carotid artery endarterectomy), DVT, prostate cancer (s/p of surgery), BPH,and  bilateral BKA. He denies cough , chest pain , sob , abdominal pain ,or abnormal BMs      Past Medical History  Diagnosis Date  . PAD (peripheral artery disease)     a. R CEA 2007. b. Hx L BKA in 2013. c. s/p LE angioplasty in 2014. d. Hx R BKA in 2014.  . Gangrene of toe     dry  . Neuromuscular disorder     diabetic neuropathy  . Hypertension   . Hyperlipidemia   . Peripheral neuropathy   . Constipation     takes Miralax daily  . Hemorrhoids   . Hx of colonic polyps   . Coronary artery disease     a. s/p NSTEMI/CABGx4 in 2007 - LIMA-LAD, SVG-optional diagonal, SVVG-OM, SVG-dRCA. b. Nuc 05/2011: nonischemic.  [name] BPH (benign prostatic hypertrophy)   . Abnormality of gait   . DVT (deep venous thrombosis)     a. Lower extremity DVT in 2013.  [name] Hypertrophy of prostate without urinary obstruction and other lower urinary tract symptoms (LUTS)   . Disorder of bone and cartilage, unspecified   . Lower limb amputation, below knee   . Anemia   . Secondary hyperparathyroidism     Secondary Hyperpara- Thyroidism,  Renal  . Ischemic cardiomyopathy     a. EF 40% in 2007. b. 51% by nuc 05/2011.  [name] Complication of anesthesia     "he gets delirious"  . Myocardial infarction 2005  . Type II diabetes mellitus   . Diabetic peripheral neuropathy   . ESRD on dialysis   . Kidney stones   . Prostate cancer 2010    Past Surgical History  Procedure Laterality Date  . Prostatectomy  2009  . Knee cartilage surgery Left 1964  . Coronary artery bypass graft  2005    CABG X4  . Carotid endarterectomy Right 2005  . Cataract extraction w/ intraocular lens  implant, bilateral Bilateral 2011  . Colonoscopy    . Cardiac catheterization  05/28/11  . Amputation  06/14/2011    Procedure: AMPUTATION DIGIT;  Surgeon: Elam Dutch, MD;  Location: [facility] OR;  Service: Vascular;  Laterality: Left;  Amputation Left fifth toe  . Amputation  06/16/2011    Procedure: AMPUTATION BELOW KNEE;  Surgeon: Elam Dutch, MD;  Location: [place];  Service: Vascular;  Laterality: Left;  . I&d extremity  01/31/2012    Procedure: IRRIGATION AND DEBRIDEMENT EXTREMITY;  Surgeon: Elam Dutch, MD;  Location: [place];  Service: Vascular;  Laterality: Left;  I & D Left BKA   . Amputation Right 06/12/2012    Procedure: AMPUTATION DIGIT;  Surgeon: Elam Dutch, MD;  Location: [facility]  OR;  Service: Vascular;  Laterality: Right;  GREAT TOE  . Amputation Right 08/08/2012    Procedure: AMPUTATION BELOW KNEE;  Surgeon: Elam Dutch, MD;  Location: [facility] OR;  Service: Vascular;  Laterality: Right;  . Abdominal aortagram N/A 05/28/2011    Procedure: ABDOMINAL Maxcine Ham;  Surgeon: Elam Dutch, MD;  Location: [facility] CATH LAB;  Service: Cardiovascular;  Laterality: N/A;  . Abdominal aortagram N/A 06/02/2012    Procedure: ABDOMINAL Maxcine Ham;  Surgeon: Elam Dutch, MD;  Location: [facility] CATH LAB;  Service: Cardiovascular;  Laterality: N/A;  . Abdominal aortagram N/A 06/09/2012    Procedure: ABDOMINAL Maxcine Ham;  Surgeon: Elam Dutch, MD;  Location: [facility] CATH  LAB;  Service: Cardiovascular;  Laterality: N/A;  . Insertion of dialysis catheter Right 04/19/2014    Procedure: INSERTION OF DIALYSIS CATHETER-INTERNAL JUGULAR;  Surgeon: Mal Misty, MD;  Location: [place];  Service: Vascular;  Laterality: Right;  . Av fistula placement Right 04/19/2014    Procedure: RIGHT ARM ARTERIOVENOUS (AV) FISTULA CREATION;  Surgeon: Mal Misty, MD;  Location: [place];  Service: Vascular;  Laterality: Right;  . Ligation of competing branches of arteriovenous fistula Right 06/19/2014    Procedure: Right Arm LIGATION OF COMPETING BRANCHES OF RADIOCEPHALIC ARTERIOVENOUS FISTULA;  Surgeon: Mal Misty, MD;  Location: [place];  Service: Vascular;  Laterality: Right;      Family History  Problem Relation Age of Onset  . Coronary artery disease Neg Hx   . Anesthesia problems Neg Hx   . Hypotension Neg Hx   . Malignant hyperthermia Neg Hx   . Pseudochol deficiency Neg Hx   . Hyperlipidemia Mother   . Hypertension Mother   . Cancer Mother   . Hyperlipidemia Father   . Hypertension Father   . Kidney disease Father   . Heart disease Sister   . Alzheimer's disease Sister   . Cancer Brother   . Other Sister       reports that he quit smoking about 23 years ago. His smoking use included Cigarettes. He has a 30 pack-year smoking history. He has never used smokeless tobacco. He reports that he drinks alcohol. He reports that he does not use illicit drugs.   Allergies  Allergen Reactions  . Oxycodone Other (See Comments)    Hallucinations    Prior to Admission medications   Medication Sig Start Date End Date Taking? Authorizing Provider  amLODipine (NORVASC) 10 MG tablet Take 1 tablet (10 mg total) by mouth daily. 05/03/14  Yes Ivan Anchors Love, PA-C  aspirin EC 81 MG tablet Take 1 tablet (81 mg total) by mouth every morning. 08/21/12  Yes Daniel J Angiulli, PA-C  calcium acetate (PHOSLO) 667 MG capsule Take 1 capsule (667 mg total) by mouth 3 (three) times daily with  meals. 05/03/14  Yes Ivan Anchors Love, PA-C  clopidogrel (PLAVIX) 75 MG tablet take 1 tablet by mouth once daily 02/11/14  Yes Blanchie Serve, MD  doxazosin (CARDURA) 1 MG tablet take 1 tablet by mouth at bedtime 02/11/14  Yes Mahima Pandey, MD  hydrALAZINE (APRESOLINE) 100 MG tablet Take 1 tablet (100 mg total) by mouth every 8 (eight) hours. 06/24/14  Yes [name], NP  HYDROcodone-acetaminophen (NORCO/VICODIN) 5-325 MG per tablet Take 1 tablet by mouth every 12 hours as needed for pain 08/14/14  Yes Estill Dooms, MD  insulin aspart (NOVOLOG) 100 UNIT/ML FlexPen Inject 5 Units into the skin 3 (three) times daily with meals. Inject 5 units  three times a day with meals if blood sugar greater than 150 only, DX: 250.71 08/07/13  Yes Mahima Pandey, MD  Insulin Detemir (LEVEMIR) 100 UNIT/ML Pen Inject 15 Units into the skin 2 (two) times daily. 05/03/14  Yes Ivan Anchors Love, PA-C  multivitamin (RENA-VIT) TABS tablet Take 1 tablet by mouth at bedtime. 05/03/14  Yes Ivan Anchors Love, PA-C  pravastatin (PRAVACHOL) 20 MG tablet Take 1 tablet (20 mg total) by mouth daily. 08/07/13  Yes Mahima Bubba Camp, MD  pregabalin (LYRICA) 75 MG capsule Take one tablet by mouth once daily for pains 08/30/14  Yes Tiffany L Reed, DO  QUEtiapine (SEROQUEL) 25 MG tablet Take 1 tablet (25 mg total) by mouth daily. 05/03/14  Yes Ivan Anchors Love, PA-C  senna-docusate (SENOKOT-S) 8.6-50 MG per tablet Take 2 tablets by mouth 2 (two) times daily. 04/25/14  Yes Florencia Reasons, MD  AMBULATORY NON FORMULARY MEDICATION BD U/P Mini Pen [place] 31GX5MM Use three times daily as directed DX:250.00 08/07/13   Blanchie Serve, MD  AMBULATORY NON FORMULARY MEDICATION One Touch Ultra 2 Test Strips Sig:  Check blood sugars Three times daily to keep blood sugars under control Dx: 250.00, 401.9 11/26/13   Blanchie Serve, MD  ciprofloxacin (CIPRO) 500 MG tablet Take 1 tablet (500 mg total) by mouth daily. Patient not taking: Reported on 09/08/2014 07/12/14   Gildardo Cranker, DO   famotidine (PEPCID) 20 MG tablet Take 1 tablet (20 mg total) by mouth at bedtime. Patient not taking: Reported on 09/08/2014 05/03/14   Bary Leriche, PA-C  Misc. Devices [facility]) [place] Patient needs new Wheelchair due to his being broken. 01/09/14   Blanchie Serve, MD    [id number] **OR** acetaminophen, hydrALAZINE, HYDROcodone-acetaminophen, ondansetron  Results for orders placed or performed during the hospital encounter of 09/08/14 (from the past 48 hour(s))  Urinalysis, Routine w reflex microscopic (not at [facility])     Status: Abnormal   Collection Time: 09/08/14  8:13 PM  Result Value Ref Range   Color, Urine YELLOW YELLOW   APPearance CLEAR CLEAR   Specific Gravity, Urine 1.020 1.005 - 1.030   pH 5.5 5.0 - 8.0   Glucose, UA >1000 (A) NEGATIVE mg/dL   Hgb urine dipstick NEGATIVE NEGATIVE   Bilirubin Urine NEGATIVE NEGATIVE   Ketones, ur NEGATIVE NEGATIVE mg/dL   Protein, ur 100 (A) NEGATIVE mg/dL   Urobilinogen, UA 0.2 0.0 - 1.0 mg/dL   Nitrite NEGATIVE NEGATIVE   Leukocytes, UA NEGATIVE NEGATIVE  Urine microscopic-add on     Status: None   Collection Time: 09/08/14  8:13 PM  Result Value Ref Range   Squamous Epithelial / LPF RARE RARE   WBC, UA 0-2 <3 WBC/hpf   RBC / HPF 0-2 <3 RBC/hpf  Comprehensive metabolic panel     Status: Abnormal   Collection Time: 09/08/14  8:35 PM  Result Value Ref Range   Sodium 130 (L) 135 - 145 mmol/L   Potassium 4.7 3.5 - 5.1 mmol/L   Chloride 98 (L) 101 - 111 mmol/L   CO2 20 (L) 22 - 32 mmol/L   Glucose, Bld 457 (H) 65 - 99 mg/dL   BUN 37 (H) 6 - 20 mg/dL   Creatinine, Ser 4.73 (H) 0.61 - 1.24 mg/dL   Calcium 8.4 (L) 8.9 - 10.3 mg/dL   Total Protein 5.9 (L) 6.5 - 8.1 g/dL   Albumin 2.9 (L) 3.5 - 5.0 g/dL   AST 16 15 - 41 U/L   ALT 9 (L) 17 -  63 U/L   Alkaline Phosphatase 108 38 - 126 U/L   Total Bilirubin 0.5 0.3 - 1.2 mg/dL   GFR calc non Af Amer 11 (L) >60 mL/min   GFR calc Af Amer 13 (L) >60 mL/min    Comment: (NOTE) The  eGFR has been calculated using the CKD EPI equation. This calculation has not been validated in all clinical situations. eGFR's persistently <60 mL/min signify possible Chronic Kidney Disease.    Anion gap 12 5 - 15  CBC with Differential     Status: Abnormal   Collection Time: 09/08/14  8:35 PM  Result Value Ref Range   WBC 10.2 4.0 - 10.5 K/uL   RBC 3.41 (L) 4.22 - 5.81 MIL/uL   Hemoglobin 10.3 (L) 13.0 - 17.0 g/dL   HCT 31.3 (L) 39.0 - 52.0 %   MCV 91.8 78.0 - 100.0 fL   MCH 30.2 26.0 - 34.0 pg   MCHC 32.9 30.0 - 36.0 g/dL   RDW 15.4 11.5 - 15.5 %   Platelets 168 150 - 400 K/uL   Neutrophils Relative % 75 43 - 77 %   Neutro Abs 7.6 1.7 - 7.7 K/uL   Lymphocytes Relative 18 12 - 46 %   Lymphs Abs 1.8 0.7 - 4.0 K/uL   Monocytes Relative 6 3 - 12 %   Monocytes Absolute 0.6 0.1 - 1.0 K/uL   Eosinophils Relative 1 0 - 5 %   Eosinophils Absolute 0.1 0.0 - 0.7 K/uL   Basophils Relative 0 0 - 1 %   Basophils Absolute 0.0 0.0 - 0.1 K/uL  I-Stat CG4 Lactic Acid, ED (Not at [facility])     Status: Abnormal   Collection Time: 09/08/14  8:42 PM  Result Value Ref Range   Lactic Acid, Venous 2.44 (HH) 0.5 - 2.0 mmol/L   Comment NOTIFIED PHYSICIAN   CBG monitoring, ED     Status: Abnormal   Collection Time: 09/08/14 10:33 PM  Result Value Ref Range   Glucose-Capillary 363 (H) 65 - 99 mg/dL  I-Stat CG4 Lactic Acid, ED (Not at [facility])     Status: None   Collection Time: 09/08/14 11:22 PM  Result Value Ref Range   Lactic Acid, Venous 1.17 0.5 - 2.0 mmol/L  Glucose, capillary     Status: Abnormal   Collection Time: 09/08/14 11:37 PM  Result Value Ref Range   Glucose-Capillary 370 (H) 65 - 99 mg/dL  Lactic acid, plasma     Status: None   Collection Time: 09/09/14 12:37 AM  Result Value Ref Range   Lactic Acid, Venous 1.7 0.5 - 2.0 mmol/L  Procalcitonin     Status: None   Collection Time: 09/09/14 12:37 AM  Result Value Ref Range   Procalcitonin 1.58 ng/mL    Comment:         Interpretation: PCT > 0.5 ng/mL and <= 2 ng/mL: Systemic infection (sepsis) is possible, but other conditions are known to elevate PCT as well. (NOTE)         ICU PCT Algorithm               Non ICU PCT Algorithm    ----------------------------     ------------------------------         PCT < 0.25 ng/mL                 PCT < 0.1 ng/mL     Stopping of antibiotics            Stopping  of antibiotics       strongly encouraged.               strongly encouraged.    ----------------------------     ------------------------------       PCT level decrease by               PCT < 0.25 ng/mL       >= 80% from peak PCT       OR PCT 0.25 - 0.5 ng/mL          Stopping of antibiotics                                             encouraged.     Stopping of antibiotics           encouraged.    ----------------------------     ------------------------------       PCT level decrease by              PCT >= 0.25 ng/mL       < 80% from peak PCT        AND PCT >= 0.5 ng/mL             Continuing antibiotics                                              encouraged.       Continuing antibiotics            encouraged.    ----------------------------     ------------------------------     PCT level increase compared          PCT > 0.5 ng/mL         with peak PCT AND          PCT >= 0.5 ng/mL             Escalation of antibiotics                                          strongly encouraged.      Escalation of antibiotics        strongly encouraged.   Protime-INR     Status: None   Collection Time: 09/09/14 12:37 AM  Result Value Ref Range   Prothrombin Time 13.9 11.6 - 15.2 seconds   INR 1.05 0.00 - 1.49  APTT     Status: None   Collection Time: 09/09/14 12:37 AM  Result Value Ref Range   aPTT 32 24 - 37 seconds  Lactic acid, plasma     Status: None   Collection Time: 09/09/14  2:50 AM  Result Value Ref Range   Lactic Acid, Venous 0.7 0.5 - 2.0 mmol/L  Basic metabolic panel     Status: Abnormal    Collection Time: 09/09/14  2:50 AM  Result Value Ref Range   Sodium 150 (H) 135 - 145 mmol/L    Comment: DELTA CHECK NOTED   Potassium 4.1 3.5 - 5.1 mmol/L   Chloride 86 (L) 101 - 111 mmol/L   CO2 16 (L) 22 - 32 mmol/L   Glucose, Bld 356 (H)  65 - 99 mg/dL   BUN 32 (H) 6 - 20 mg/dL   Creatinine, Ser 4.11 (H) 0.61 - 1.24 mg/dL   Calcium 5.4 (LL) 8.9 - 10.3 mg/dL    Comment: CRITICAL RESULT CALLED TO, READ BACK BY AND VERIFIED WITH: CASTRO,E RN 09/09/2014 0534 JORDANS    GFR calc non Af Amer 13 (L) >60 mL/min   GFR calc Af Amer 15 (L) >60 mL/min    Comment: (NOTE) The eGFR has been calculated using the CKD EPI equation. This calculation has not been validated in all clinical situations. eGFR's persistently <60 mL/min signify possible Chronic Kidney Disease.    Anion gap 48 (H) 5 - 15    Comment: RESULT CHECKED  CBC     Status: Abnormal   Collection Time: 09/09/14  2:50 AM  Result Value Ref Range   WBC 9.0 4.0 - 10.5 K/uL   RBC 3.58 (L) 4.22 - 5.81 MIL/uL   Hemoglobin 10.6 (L) 13.0 - 17.0 g/dL   HCT 33.3 (L) 39.0 - 52.0 %   MCV 93.0 78.0 - 100.0 fL   MCH 29.6 26.0 - 34.0 pg   MCHC 31.8 30.0 - 36.0 g/dL   RDW 15.5 11.5 - 15.5 %   Platelets 177 150 - 400 K/uL  MRSA PCR Screening     Status: None   Collection Time: 09/09/14  5:27 AM  Result Value Ref Range   MRSA by PCR NEGATIVE NEGATIVE    Comment:        The GeneXpert MRSA Assay (FDA approved for NASAL specimens only), is one component of a comprehensive MRSA colonization surveillance program. It is not intended to diagnose MRSA infection nor to guide or monitor treatment for MRSA infections.   Glucose, capillary     Status: Abnormal   Collection Time: 09/09/14  7:40 AM  Result Value Ref Range   Glucose-Capillary 300 (H) 65 - 99 mg/dL     ROS: see positives in HPI only   Physical Exam: Filed Vitals:   09/09/14 0700  BP: 159/68  Pulse: 100  Temp:   Resp:      General: Alert elderly BM, NAD ,  Pleasant HEENT: [place], MMM, nonicteric  Neck: supple, no jvd Heart: RRR , soft 1/6 sem lsb , no rub or gallop Lungs: CTA  Abdomen: obese, BS pos, soft ,nontender ,nondistended  Extremities: Bilat BKA stumps no edema or wounds Skin: no overt rash  Warm dry Neuro: alert, OX3, , moves all extrem.  Dialysis Access: R FA AVF pos bruit and thrill/ R IJ Perm cath  No dc or tenderness  Dialysis Orders: [facility]   on MWF . EDW 81.5kg HD Bath 3k,2.25ca  Time 4hr Heparin 8500. Access R IJ Perm cath and RFA AVF BFR 400  DFR 800   Hec 5 mcg IV/HD    o esa    Units IV/HD  Venofer  15m weekly hd  Other op labs= hgb 11.7, ca 9.7 , phos 5.0  pth  182  Assessment/Plan 1. ESRD -  HD MWF use avf today ,if okay  VVS dc perm cath/Na 130 admit to am 150?? correct lab  fu pre  hd  2. Febrile Illness- ?? Source /  Could be hd perm cath / DC  perm cath if avrf  function today with hd  / admit team wu  On AB  3. Hypertension/volume   bp 159/68 , Esrd pt on hd does not need iv fluids now.-leaving below edw  and htn at op center /change Amlodipine to hs (as at home) to avoid bp dro-p on hd / Hydralazine 100 tid and cardura  Taper as needed /  4. Anemia  - hgb 10.6 no esa as op , fe weekly hd , fu hgb trend  5. Metabolic bone disease -  Vit d and binder Admit ca 8.4  This am 5.4 not sure am lab correct 6. ASCVD 7. DM type 2    Ernest Haber, PA-C [place] [phone] 09/09/2014, 8:09 AM

## 2014-09-09 NOTE — Progress Notes (Signed)
[facility]                                                                              Patient Demographics  Aaron Long, is a 74 y.o. male, DOB - [birth year], GBT:[id number]  Admit date - 09/08/2014   Admitting Physician Ivor Costa, MD  Outpatient Primary MD for the patient is [name], NP  LOS - 1   Chief Complaint  Patient presents with  . Fever  . Altered Mental Status      HPI on 09/09/2014 by Dr. Ivor Costa DA AUTHEMENT is a 74 y.o. male with PMH of ESRD-HD (MWF), CAD (s/p of CABG), hypertension, hyperlipidemia, diastolic congestive heart failure (EF of 60-65%), GERD, depression, carotid artery stenosis (s/p of right carotid artery endarterectomy), DVT, prostate cancer (s/p of surgery), BPH, bilateral BKA, who presents with fever, chills, confusion. Patient reports that he started having fever and chills since this morning, but no other symptoms, such as cough, runny nose, sore throat, chest pain, abdominal pain, diarrhea, symptoms of UTI, skin infection, ear pain, headache, neck rigidity, photophobia. No tenderness over his dialysis catheter in right upper chest. His wife reports that the patient was mildly confused, which has already resolved in the emergency room. She does not have rashes, unilateral weakness. In ED, patient was found to have temperature 102.6, lactate 2.44, WBC 10.2, tachycardia, negative urinalysis, creatinine 4.73, BUN 37, negative chest x-ray for acute abnormalities. Patient is admitted to inpatient for further eval and treatment.  Assessment & Plan   Sepsis secondary to unknown cause -Upon admission, patient was febrile with tachycardia -? PermCath -Continue vancomycin and Zosyn -Blood cultures show no growth to date -Chest x-ray shows no acute pulmonary findings -UA negative for infection  Acute encephalopathy -Seems to be at baseline -Likely secondary to sepsis -CT head: No acute intracranial process  End-stage renal  disease -Nephrology consulted and appreciated -Patient dialyzes MWF  Constipation -Will order soapsuds enema -Possibly leading to sepsis versus encephalopathy  Hypertension -Continue amlodipine and hydralazine as necessary  Diabetes mellitus, type II with neuropathy -Hemoglobin A1c 7.2 -Continue insulin sliding scale CBG monitoring -Continue Lyrica for neuropathy  Diastolic congestive heart failure -Echocardiogram 320 90,016 shows EF 60-65% with grade 1 diastolic dysfunction -Currently appears to be euvolemic and compensated. -Managed by dialysis  Coronary artery disease -Currently no chest pain -Continue aspirin, Plavix, statin  GERD -Continue Pepcid  Anemia of chronic disease -Hemoglobin currently stable -Continue to monitor CBC  Hyperlipidemia -Continue statin -LDL 97 (on 04/02/2014)  History prostate cancer -s/p surgery   Code Status: Full  Family Communication:  Daughter at bedside   Disposition Plan: Admitted   Time Spent in minutes   30 minutes  Procedures  None   Consults   Nephrology   DVT Prophylaxis  heparin   Lab Results  Component Value Date   PLT 177 09/09/2014    Medications  Scheduled Meds: . amLODipine  10 mg Oral QHS  . aspirin EC  81 mg Oral q morning - 10a  . calcium acetate  667 mg Oral TID WC  . clopidogrel  75 mg Oral Daily  . doxazosin  1 mg Oral QHS  .  famotidine  20 mg Oral QHS  . heparin  5,000 Units Subcutaneous 3 times per day  . insulin aspart  0-9 Units Subcutaneous TID WC  . insulin detemir  18 Units Subcutaneous BID  . multivitamin  1 tablet Oral QHS  . piperacillin-tazobactam (ZOSYN)  IV  2.25 g Intravenous 3 times per day  . pravastatin  20 mg Oral Daily  . pregabalin  75 mg Oral Daily  . QUEtiapine  25 mg Oral Daily  . senna-docusate  2 tablet Oral BID  . sodium chloride  3 mL Intravenous Q12H  . vancomycin  1,750 mg Intravenous Once  . [START ON 09/11/2014] vancomycin  1,000 mg Intravenous Q M,W,F-HD     Continuous Infusions:  PRN Meds:.acetaminophen **OR** acetaminophen, hydrALAZINE, HYDROcodone-acetaminophen, ondansetron  Antibiotics    Anti-infectives    Start     Dose/Rate Route Frequency Ordered Stop   09/11/14 1200  vancomycin (VANCOCIN) IVPB 1000 mg/200 mL premix     1,000 mg 200 mL/hr over 60 Minutes Intravenous Every M-W-F (Hemodialysis) 09/09/14 1215     09/09/14 1800  vancomycin (VANCOCIN) IVPB 1000 mg/200 mL premix  Status:  Discontinued     1,000 mg 200 mL/hr over 60 Minutes Intravenous Every M-W-F (Hemodialysis) 09/09/14 1203 09/09/14 1215   09/09/14 1600  vancomycin (VANCOCIN) 1,750 mg in sodium chloride 0.9 % 500 mL IVPB     1,750 mg 250 mL/hr over 120 Minutes Intravenous  Once 09/09/14 1215     09/09/14 1300  vancomycin (VANCOCIN) IVPB 750 mg/150 ml premix  Status:  Discontinued     750 mg 150 mL/hr over 60 Minutes Intravenous  Once 09/09/14 1203 09/09/14 1215   09/09/14 0600  piperacillin-tazobactam (ZOSYN) IVPB 2.25 g     2.25 g 100 mL/hr over 30 Minutes Intravenous 3 times per day 09/08/14 2353     09/08/14 2345  piperacillin-tazobactam (ZOSYN) IVPB 3.375 g  Status:  Discontinued     3.375 g 100 mL/hr over 30 Minutes Intravenous 3 times per day 09/08/14 2343 09/08/14 2352   09/08/14 2230  piperacillin-tazobactam (ZOSYN) IVPB 3.375 g     3.375 g 100 mL/hr over 30 Minutes Intravenous  Once 09/08/14 2220 09/08/14 2301   09/08/14 2230  vancomycin (VANCOCIN) IVPB 1000 mg/200 mL premix  Status:  Discontinued     1,000 mg 200 mL/hr over 60 Minutes Intravenous Every 48 hours 09/08/14 2227 09/09/14 1155        Subjective:   Kipp Laurence seen and examined today.  Patient denies chest pain, shortness of breath, abdominal pain, nausea or vomiting. States he has not had a bowel movement in over a week.    Objective:   Filed Vitals:   09/08/14 2343 09/09/14 0423 09/09/14 0700 09/09/14 0845  BP: 162/60 186/74 159/68 153/64  Pulse: 100 100 100 103  Temp: 98.6  F (37 C) 99.6 F (37.6 C)  100.3 F (37.9 C)  TempSrc: Oral Oral  Oral  Resp: 16 16  18   Height:      Weight: 83.915 kg (185 lb)     SpO2: 98% 94%  96%    Wt Readings from Last 3 Encounters:  09/08/14 83.915 kg (185 lb)  06/19/14 92.987 kg (205 lb)  06/11/14 92.987 kg (205 lb)     Intake/Output Summary (Last 24 hours) at 09/09/14 1343 Last data filed at 09/09/14 1000  Gross per 24 hour  Intake 1096.25 ml  Output    400 ml  Net 696.25 ml  Exam  General: Well developed, well nourished, NAD, appears stated age  16: NCAT,mucous membranes moist.   Cardiovascular: S1 S2 auscultated, soft SEM,  Regular rate and rhythm.  Respiratory: Clear to auscultation bilaterally with equal chest rise  Abdomen: Soft, nontender, nondistended, + bowel sounds  Extremities: B/L BKA, stumps wrapped.  Right forearm AVF +B/T.  RIJ perm cath.  Neuro: AAOx3, nonfocal  Skin: Without rashes exudates or nodules  Psych: Normal affect and demeanor   Data Review   Micro Results Recent Results (from the past 240 hour(s))  Culture, blood (routine x 2)     Status: None (Preliminary result)   Collection Time: 09/08/14  8:35 PM  Result Value Ref Range Status   Specimen Description BLOOD LEFT HAND  Final   Special Requests BOTTLES DRAWN AEROBIC ONLY 1CC  Final   Culture NO GROWTH < 24 HOURS  Final   Report Status PENDING  Incomplete  Culture, blood (routine x 2)     Status: None (Preliminary result)   Collection Time: 09/08/14 10:10 PM  Result Value Ref Range Status   Specimen Description BLOOD LEFT HAND  Final   Special Requests   Final    BOTTLES DRAWN AEROBIC AND ANAEROBIC 2CC ANA 3CC AER   Culture NO GROWTH < 24 HOURS  Final   Report Status PENDING  Incomplete  MRSA PCR Screening     Status: None   Collection Time: 09/09/14  5:27 AM  Result Value Ref Range Status   MRSA by PCR NEGATIVE NEGATIVE Final    Comment:        The GeneXpert MRSA Assay (FDA approved for NASAL  specimens only), is one component of a comprehensive MRSA colonization surveillance program. It is not intended to diagnose MRSA infection nor to guide or monitor treatment for MRSA infections.     Radiology Reports Dg Chest 2 View  09/08/2014   CLINICAL DATA:  Fever of 2-3 days duration.  EXAM: CHEST  2 VIEW  COMPARISON:  04/19/2014  FINDINGS: There is a right internal jugular central line extending into the SVC. There is mild unchanged left hemidiaphragm elevation and mild unchanged linear left base scarring. The lungs are otherwise clear. The pulmonary vasculature is normal. There is no pleural effusion. There is prior sternotomy and CABG.  IMPRESSION: Mild linear left base scarring.  No acute cardiopulmonary findings.   Electronically Signed   By: Andreas Newport M.D.   On: 09/08/2014 21:08   Ct Head Wo Contrast  09/08/2014   CLINICAL DATA:  Initial evaluation for acute altered mental status.  EXAM: CT HEAD WITHOUT CONTRAST  TECHNIQUE: Contiguous axial images were obtained from the base of the skull through the vertex without intravenous contrast.  COMPARISON:  Prior study from 04/17/2014.  FINDINGS: Atrophy with chronic microvascular ischemic disease present. Vascular calcifications present within the carotid siphons and distal vertebral arteries.  No acute large vessel territory infarct. No mass lesion, midline shift, or mass effect. Asymmetric hypodensity adjacent to the atrium of the left lateral ventricle a is grossly similar to previous. No associated edema. This is of doubtful clinical significance. No hydrocephalus. No extra-axial fluid collection.  Scalp soft tissues within normal limits. No acute abnormality about the orbits.  Paranasal sinuses and mastoid air cells are clear. Calvarium intact.  IMPRESSION: 1. No acute intracranial process. 2. Generalized cerebral atrophy with chronic microvascular ischemic disease, stable.   Electronically Signed   By: Jeannine Boga M.D.   On:  09/08/2014 23:19  CBC  Recent Labs Lab 09/08/14 2035 09/09/14 0250  WBC 10.2 9.0  HGB 10.3* 10.6*  HCT 31.3* 33.3*  PLT 168 177  MCV 91.8 93.0  MCH 30.2 29.6  MCHC 32.9 31.8  RDW 15.4 15.5  LYMPHSABS 1.8  --   MONOABS 0.6  --   EOSABS 0.1  --   BASOSABS 0.0  --     Chemistries   Recent Labs Lab 09/08/14 2035 09/09/14 0250  NA 130* 150*  K 4.7 4.1  CL 98* 86*  CO2 20* 16*  GLUCOSE 457* 356*  BUN 37* 32*  CREATININE 4.73* 4.11*  CALCIUM 8.4* 5.4*  AST 16  --   ALT 9*  --   ALKPHOS 108  --   BILITOT 0.5  --    ------------------------------------------------------------------------------------------------------------------ estimated creatinine clearance is 16.6 mL/min (by C-G formula based on Cr of 4.11). ------------------------------------------------------------------------------------------------------------------ No results for input(s): HGBA1C in the last 72 hours. ------------------------------------------------------------------------------------------------------------------ No results for input(s): CHOL, HDL, LDLCALC, TRIG, CHOLHDL, LDLDIRECT in the last 72 hours. ------------------------------------------------------------------------------------------------------------------ No results for input(s): TSH, T4TOTAL, T3FREE, THYROIDAB in the last 72 hours.  Invalid input(s): FREET3 ------------------------------------------------------------------------------------------------------------------ No results for input(s): VITAMINB12, FOLATE, FERRITIN, TIBC, IRON, RETICCTPCT in the last 72 hours.  Coagulation profile  Recent Labs Lab 09/09/14 0037  INR 1.05    No results for input(s): DDIMER in the last 72 hours.  Cardiac Enzymes No results for input(s): CKMB, TROPONINI, MYOGLOBIN in the last 168 hours.  Invalid input(s):  CK ------------------------------------------------------------------------------------------------------------------ Invalid input(s): POCBNP    Mariza Bourget D.O. on 09/09/2014 at 1:43 PM  Between 7am to 7pm - Pager - [phone]  After 7pm go to [email or web address] - password [email or web address]  And look for the night coverage person covering for me after hours  [facility] Office  [phone]

## 2014-09-09 NOTE — Evaluation (Signed)
Occupational Therapy Evaluation Patient Details Name: Aaron Long MRN: [id number] DOB: [birth year] Today's Date: 09/09/2014    History of Present Illness 74 yo admitted with confusion and fever PMH: bil BKA with prothesis HTN, CAD s/p CABG, CHF, GERD, Depression, prostate CA, PAD   Clinical Impression   PT admitted with fever and confusion. Pt currently with functional limitiations due to the deficits listed below (see OT problem list). PTA pt used w/c for transportation and has prothesis with RW for transfers. Pt has family (A) at baseline.Pt was receiving therapy at outpatient.  Pt will benefit from skilled OT to increase their independence and safety with adls and balance to allow discharge [place] .     Follow Up Recommendations  Home health OT    Equipment Recommendations  None recommended by OT    Recommendations for Other Services       Precautions / Restrictions Precautions Precautions: Fall      Mobility Bed Mobility Overal bed mobility: Needs Assistance Bed Mobility: Supine to Sit;Sit to Supine     Supine to sit: Mod assist Sit to supine: Min assist   General bed mobility comments: requires (A) for trunk and hip rotation toward EOB. pt once eob unable to sustain static sitting balance  Transfers                 General transfer comment: need w/c and sliding board to attempt next session. pt reports completing task with BIL UE at baseline    Balance Overall balance assessment: Needs assistance Sitting-balance support: Bilateral upper extremity supported;Feet unsupported Sitting balance-Leahy Scale: Poor   Postural control: Posterior lean                                  ADL Overall ADL's : Needs assistance/impaired Eating/Feeding: Set up;Sitting Eating/Feeding Details (indicate cue type and reason): drinking soda from cup Grooming: Wash/dry face;Minimal assistance Grooming Details (indicate cue type and reason): due to LOB  needs steady (A) Upper Body Bathing: Minimal assitance;Bed level   Lower Body Bathing: Maximal assistance;Bed level                         General ADL Comments: pt reports at baseline transferring to w/c to don prothesis and currently no w/c or prothesis in room. OT will attempt w/c transfer next session. Pt demonstrates decr bed mobility and static sitting at this time.      Vision     Perception     Praxis      Pertinent Vitals/Pain Pain Assessment: Faces Faces Pain Scale: Hurts a little bit Pain Location: R forearm Pain Intervention(s): Repositioned     Hand Dominance Left   Extremity/Trunk Assessment Upper Extremity Assessment Upper Extremity Assessment: Overall WFL for tasks assessed   Lower Extremity Assessment Lower Extremity Assessment: Defer to PT evaluation;RLE deficits/detail;LLE deficits/detail RLE Deficits / Details: BKA LLE Deficits / Details: BKA   Cervical / Trunk Assessment Cervical / Trunk Assessment: Normal   Communication Communication Communication: No difficulties   Cognition Arousal/Alertness: Awake/alert Behavior During Therapy: WFL for tasks assessed/performed Overall Cognitive Status: Impaired/Different from baseline Area of Impairment: Orientation;Memory;Awareness Orientation Level: Disoriented to;Situation;Time   Memory: Decreased short-term memory     Awareness: Intellectual   General Comments: reports daughter in room is neice and unaware of error. pt able to correct when daughter speaking. pt oriented by therapist for reason for admission.  General Comments       Exercises       Shoulder Instructions      Home Living Family/patient expects to be discharged to:: Private residence Living Arrangements: Spouse/significant other Available Help at Discharge: Family;Available PRN/intermittently Type of Home: Apartment Home Access: Level entry     Home Layout: One level     Bathroom Shower/Tub: Tub/shower  unit;Curtain (sponge bath in the bed)   Bathroom Toilet: Standard Bathroom Accessibility: Yes How Accessible: Accessible via wheelchair;Accessible via walker Home Equipment: Wheelchair - manual;Walker - 2 wheels;Other (comment) (reports having prothetics for bil LE)          Prior Functioning/Environment Level of Independence: Needs assistance  Gait / Transfers Assistance Needed: uses prothesis ADL's / Homemaking Assistance Needed: completes bathing in bed level adn completed toilet transfer with prothesis / slide transfer per pt        OT Diagnosis: Generalized weakness;Cognitive deficits   OT Problem List: Decreased strength;Decreased activity tolerance;Impaired balance (sitting and/or standing);Decreased cognition;Decreased safety awareness;Decreased knowledge of use of DME or AE;Decreased knowledge of precautions   OT Treatment/Interventions: Self-care/ADL training;Therapeutic exercise;DME and/or AE instruction;Therapeutic activities;Cognitive remediation/compensation;Patient/family education;Balance training    OT Goals(Current goals can be found in the care plan section) Acute Rehab OT Goals Patient Stated Goal: none stated OT Goal Formulation: With patient Time For Goal Achievement: 09/23/14 Potential to Achieve Goals: Good  OT Frequency: Min 2X/week   Barriers to D/C:            Co-evaluation              End of Session Nurse Communication: Mobility status;Precautions  Activity Tolerance: Patient tolerated treatment well Patient left: in bed;with call bell/phone within reach;with family/visitor present   Time: 1012-1029 OT Time Calculation (min): 17 min Charges:  OT General Charges $OT Visit: 1 Procedure OT Evaluation $Initial OT Evaluation Tier I: 1 Procedure G-Codes:    Parke Poisson B Sep 23, 2014, 11:04 AM  Jeri Modena   OTR/L Pager: [phone] Office: [phone] .

## 2014-09-09 NOTE — Progress Notes (Signed)
Inpatient Diabetes Program Recommendations  AACE/ADA: New Consensus Statement on Inpatient Glycemic Control (2013)  Target Ranges:  Prepandial:   less than 140 mg/dL      Peak postprandial:   less than 180 mg/dL (1-2 hours)      Critically ill patients:  140 - 180 mg/dL   Results for KEVAL, NAM (MRN [id number]) as of 09/09/2014 08:26  Ref. Range 09/08/2014 22:33 09/08/2014 23:37 09/09/2014 07:40  Glucose-Capillary Latest Ref Range: 65-99 mg/dL 363 (H) 370 (H) 300 (H)   Reason for Admission: Sepsis  Diabetes history: DM 2 Outpatient Diabetes medications: Levemir 15 units BID, Novolog 5 units TID meal coverage Current orders for Inpatient glycemic control: Levemir 18 units BID (to get second dose this am), Novolog Sensitive  Inpatient Diabetes Program Recommendations Insulin - Meal Coverage: Patient takes 5 units of Meal Coverage at home. Please consider adding Novolog 5 units meal coverage TID.  Thanks,  Tama Headings RN, MSN, [facility] Inpatient Diabetes Coordinator Team Pager [phone]

## 2014-09-09 NOTE — Progress Notes (Signed)
OT NOTE  Recommendation changed to SNF based on fatigue and need for increase help after PT Manuela Schwartz session. SNF recommended at this time.   Jeri Modena   OTR/L Pager: [phone] Office: [phone] .

## 2014-09-10 DIAGNOSIS — N186 End stage renal disease: Secondary | ICD-10-CM

## 2014-09-10 LAB — URINE CULTURE

## 2014-09-10 LAB — CBC
HCT: 31.6 % — ABNORMAL LOW (ref 39.0–52.0)
HEMATOCRIT: 31.8 % — AB (ref 39.0–52.0)
Hemoglobin: 10.4 g/dL — ABNORMAL LOW (ref 13.0–17.0)
Hemoglobin: 10.5 g/dL — ABNORMAL LOW (ref 13.0–17.0)
MCH: 30.6 pg (ref 26.0–34.0)
MCH: 30.9 pg (ref 26.0–34.0)
MCHC: 32.7 g/dL (ref 30.0–36.0)
MCHC: 33.2 g/dL (ref 30.0–36.0)
MCV: 93.5 fL (ref 78.0–100.0)
MCV: 92.9 fL (ref 78.0–100.0)
PLATELETS: 163 10*3/uL (ref 150–400)
Platelets: 184 10*3/uL (ref 150–400)
RBC: 3.4 MIL/uL — ABNORMAL LOW (ref 4.22–5.81)
RBC: 3.4 MIL/uL — ABNORMAL LOW (ref 4.22–5.81)
RDW: 15.7 % — AB (ref 11.5–15.5)
RDW: 15.6 % — ABNORMAL HIGH (ref 11.5–15.5)
WBC: 8.8 10*3/uL (ref 4.0–10.5)
WBC: 9.1 10*3/uL (ref 4.0–10.5)

## 2014-09-10 LAB — BASIC METABOLIC PANEL
Anion gap: 8 (ref 5–15)
BUN: 30 mg/dL — ABNORMAL HIGH (ref 6–20)
CALCIUM: 9 mg/dL (ref 8.9–10.3)
CO2: 27 mmol/L (ref 22–32)
CREATININE: 4.37 mg/dL — AB (ref 0.61–1.24)
Chloride: 101 mmol/L (ref 101–111)
GFR, EST AFRICAN AMERICAN: 14 mL/min — AB (ref >60)
GFR, EST NON AFRICAN AMERICAN: 12 mL/min — AB (ref >60)
Glucose, Bld: 230 mg/dL — ABNORMAL HIGH (ref 65–99)
Potassium: 4.8 mmol/L (ref 3.5–5.1)
Sodium: 136 mmol/L (ref 135–145)

## 2014-09-10 LAB — GLUCOSE, CAPILLARY
GLUCOSE-CAPILLARY: 210 mg/dL — AB (ref 65–99)
GLUCOSE-CAPILLARY: 178 mg/dL — AB (ref 65–99)
GLUCOSE-CAPILLARY: 182 mg/dL — AB (ref 65–99)
GLUCOSE-CAPILLARY: 97 mg/dL (ref 65–99)

## 2014-09-10 LAB — RENAL FUNCTION PANEL
Albumin: 2.8 g/dL — ABNORMAL LOW (ref 3.5–5.0)
Anion gap: 8 (ref 5–15)
BUN: 36 mg/dL — ABNORMAL HIGH (ref 6–20)
CO2: 26 mmol/L (ref 22–32)
Calcium: 9 mg/dL (ref 8.9–10.3)
Chloride: 101 mmol/L (ref 101–111)
Creatinine, Ser: 4.59 mg/dL — ABNORMAL HIGH (ref 0.61–1.24)
GFR calc Af Amer: 13 mL/min — ABNORMAL LOW (ref >60)
GFR calc non Af Amer: 11 mL/min — ABNORMAL LOW (ref >60)
Glucose, Bld: 121 mg/dL — ABNORMAL HIGH (ref 65–99)
Phosphorus: 4.4 mg/dL (ref 2.5–4.6)
Potassium: 4.3 mmol/L (ref 3.5–5.1)
Sodium: 135 mmol/L (ref 135–145)

## 2014-09-10 LAB — HEPATITIS B SURFACE ANTIGEN: HEP B S AG: NEGATIVE

## 2014-09-10 MED ORDER — SODIUM CHLORIDE 0.9 % IV SOLN: 100.0000 mL | INTRAVENOUS | Status: DC | PRN

## 2014-09-10 MED ORDER — PENTAFLUOROPROP-TETRAFLUOROETH EX AERO: 1.0000 "application " | INHALATION_SPRAY | CUTANEOUS | Status: DC | PRN

## 2014-09-10 MED ORDER — NEPRO/CARBSTEADY PO LIQD
237.0000 mL | ORAL | Status: DC | PRN
  Administered 2014-09-10: 237 mL via ORAL

## 2014-09-10 MED ORDER — LACTULOSE 10 GM/15ML PO SOLN
10.0000 g | Freq: Once | ORAL | Status: AC
  Administered 2014-09-10: 10 g via ORAL
  Filled 2014-09-10: qty 15

## 2014-09-10 MED ORDER — DOXERCALCIFEROL 4 MCG/2ML IV SOLN
5.0000 ug | INTRAVENOUS | Status: DC
  Administered 2014-09-11 – 2014-09-13 (×2): 5 ug via INTRAVENOUS
  Filled 2014-09-10 (×2): qty 4

## 2014-09-10 MED ORDER — ALTEPLASE 2 MG IJ SOLR
2.0000 mg | Freq: Once | INTRAMUSCULAR | Status: DC | PRN
  Filled 2014-09-10: qty 2

## 2014-09-10 MED ORDER — DARBEPOETIN ALFA 25 MCG/0.42ML IJ SOSY
25.0000 ug | PREFILLED_SYRINGE | INTRAMUSCULAR | Status: DC
  Administered 2014-09-11: 25 ug via INTRAVENOUS
  Filled 2014-09-10: qty 0.42

## 2014-09-10 MED ORDER — LIDOCAINE HCL (PF) 2 % IJ SOLN
10.0000 mL | Freq: Once | INTRAMUSCULAR | Status: DC
  Filled 2014-09-10 (×2): qty 10

## 2014-09-10 MED ORDER — POLYETHYLENE GLYCOL 3350 17 G PO PACK
17.0000 g | PACK | Freq: Every day | ORAL | Status: DC
  Administered 2014-09-10 – 2014-09-14 (×4): 17 g via ORAL
  Filled 2014-09-10 (×4): qty 1

## 2014-09-10 MED ORDER — LIDOCAINE-PRILOCAINE 2.5-2.5 % EX CREA
1.0000 "application " | TOPICAL_CREAM | CUTANEOUS | Status: DC | PRN
  Filled 2014-09-10: qty 5

## 2014-09-10 MED ORDER — HEPARIN SODIUM (PORCINE) 1000 UNIT/ML DIALYSIS: 20.0000 [IU]/kg | INTRAMUSCULAR | Status: DC | PRN

## 2014-09-10 MED ORDER — LIDOCAINE HCL (PF) 1 % IJ SOLN: 5.0000 mL | INTRAMUSCULAR | Status: DC | PRN

## 2014-09-10 MED ORDER — HEPARIN SODIUM (PORCINE) 1000 UNIT/ML DIALYSIS: 1000.0000 [IU] | INTRAMUSCULAR | Status: DC | PRN

## 2014-09-10 NOTE — Consult Note (Signed)
   [facility] CM Inpatient Consult   09/10/2014  TIMOUTHY GILARDI Mar 09, 1940 [id number] Received a call from the patient's wife, Karry Causer, from [phone] stating, "My husband gave me your card and I want to find out about your services."  HIPPA verified per protocol.  Wife states she is interested in the patient receiving some therapy prior to returning home.  She states that she lost her eyesight in one eye and has had some difficulty with his medications.  She states she tries to work on the patient's non-dialysis days and she would like some information on some resources for the patient, as well.  She states she would like to speak with the inpatient social worker also. She states she would like to find out his options for a SNF rehab before committing to [facility].  Will forward this information to the inpatient social worker.  For questions, please contact: Natividad Brood, RN BSN [facility] Liaison  [phone] business mobile phone

## 2014-09-10 NOTE — Progress Notes (Signed)
[facility] Progress Note  Assessment/Plan: 1. ESRD - HD MWF use avf without problems Na now 136; may need to change to 2 K bath at d/c 2. AMS/Sepsis -Cultures pending on Vanc and Zosyn - Tmax 100.3 , TDC removed this am, tips sent for culture 3. Hypertension/volume-titrating EDW norvasc 10, cardura 1 -off hydralazine, UF 879 cc with post weight 84.6 (not weighed pre) - still above outpt EDW 81.5 (had small gains and got below edw at times)- get hoyer weights for accuracy 4. Anemia - hgb 10.4 - steady decline probably due to infection - start aranesp 25 - hold Fe due to sepsis - last tsat 37% 5. Metabolic bone disease - Vit d and binder  6. ASCVD - on plavix-  7. DM type 2 - pre primary  Myriam Jacobson, PA-C [place] [phone] 09/10/2014,11:10 AM  LOS: 2 days   Subjective:   Denies problems on HD yesterday.   Objective Filed Vitals:   09/09/14 1840 09/09/14 2015 09/10/14 0507 09/10/14 0736  BP: 119/53 148/53 138/63 145/85  Pulse: 102 51 72 79  Temp: 99 F (37.2 C) 98.6 F (37 C) 98 F (36.7 C) 98.3 F (36.8 C)  TempSrc: Axillary Oral Oral Oral  Resp: 18 16 16 16   Height:      Weight:      SpO2: 98% 95% 94% 99%   Physical Exam (TDC just removed PA holding pressure) General: NAD on room air Heart: RRR Lungs: no rales anteriorly Abdomen: soft NT Extremities: bilateral BKA no edema Dialysis Access:  Right AVF + bruit  Dialysis Orders: [facility] on MWF . EDW 81.5kg HD Bath 3k,2.25ca Time 4hr Heparin 8500. Access R IJ Perm cath and RFA AVF BFR 400 DFR 800 Hec 5 mcg IV/HD o esa Units IV/HD Venofer 50mg  weekly hd  Other op labs= hgb 11.7, ca 9.7 , phos 5.0 pth 182 37% sat  Additional Objective Labs: Basic Metabolic Panel:  Recent Labs Lab 09/09/14 0250 09/09/14 1420 09/10/14 0738  NA 150* 133* 136  K 4.1 4.4 4.8  CL 86* 104 101  CO2 16* 22 27  GLUCOSE 356* 146* 230*  BUN 32* 38* 30*  CREATININE 4.11*  4.69* 4.37*  CALCIUM 5.4* 9.3 9.0  PHOS  --  3.9  --    Liver Function Tests:  Recent Labs Lab 09/08/14 2035 09/09/14 1420  AST 16  --   ALT 9*  --   ALKPHOS 108  --   BILITOT 0.5  --   PROT 5.9*  --   ALBUMIN 2.9* 2.9*   CBC:  Recent Labs Lab 09/08/14 2035 09/09/14 0250 09/09/14 1420 09/10/14 0738  WBC 10.2 9.0 9.0 8.8  NEUTROABS 7.6  --  6.7  --   HGB 10.3* 10.6* 10.1* 10.4*  HCT 31.3* 33.3* 31.5* 31.8*  MCV 91.8 93.0 92.9 93.5  PLT 168 177 185 163   Blood Culture    Component Value Date/Time   SDES BLOOD LEFT HAND 09/08/2014 2210   SPECREQUEST  09/08/2014 2210    BOTTLES DRAWN AEROBIC AND ANAEROBIC 2CC ANA 3CC AER   CULT NO GROWTH < 24 HOURS 09/08/2014 2210   REPTSTATUS PENDING 09/08/2014 2210    CBG:  Recent Labs Lab 09/08/14 2337 09/09/14 0740 09/09/14 1157 09/09/14 1836 09/10/14 0735  GLUCAP 370* 300* 161* 146* 210*   Studies/Results: Dg Chest 2 View  09/08/2014   CLINICAL DATA:  Fever of 2-3 days duration.  EXAM: CHEST  2  VIEW  COMPARISON:  04/19/2014  FINDINGS: There is a right internal jugular central line extending into the SVC. There is mild unchanged left hemidiaphragm elevation and mild unchanged linear left base scarring. The lungs are otherwise clear. The pulmonary vasculature is normal. There is no pleural effusion. There is prior sternotomy and CABG.  IMPRESSION: Mild linear left base scarring.  No acute cardiopulmonary findings.   Electronically Signed   By: Andreas Newport M.D.   On: 09/08/2014 21:08   Ct Head Wo Contrast  09/08/2014   CLINICAL DATA:  Initial evaluation for acute altered mental status.  EXAM: CT HEAD WITHOUT CONTRAST  TECHNIQUE: Contiguous axial images were obtained from the base of the skull through the vertex without intravenous contrast.  COMPARISON:  Prior study from 04/17/2014.  FINDINGS: Atrophy with chronic microvascular ischemic disease present. Vascular calcifications present within the carotid siphons and distal  vertebral arteries.  No acute large vessel territory infarct. No mass lesion, midline shift, or mass effect. Asymmetric hypodensity adjacent to the atrium of the left lateral ventricle a is grossly similar to previous. No associated edema. This is of doubtful clinical significance. No hydrocephalus. No extra-axial fluid collection.  Scalp soft tissues within normal limits. No acute abnormality about the orbits.  Paranasal sinuses and mastoid air cells are clear. Calvarium intact.  IMPRESSION: 1. No acute intracranial process. 2. Generalized cerebral atrophy with chronic microvascular ischemic disease, stable.   Electronically Signed   By: Jeannine Boga M.D.   On: 09/08/2014 23:19   Medications:   . amLODipine  10 mg Oral QHS  . aspirin EC  81 mg Oral q morning - 10a  . calcium acetate  667 mg Oral TID WC  . clopidogrel  75 mg Oral Daily  . doxazosin  1 mg Oral QHS  . famotidine  20 mg Oral QHS  . heparin  5,000 Units Subcutaneous 3 times per day  . insulin aspart  0-9 Units Subcutaneous TID WC  . insulin detemir  18 Units Subcutaneous BID  . lidocaine  10 mL Intradermal Once  . multivitamin  1 tablet Oral QHS  . piperacillin-tazobactam (ZOSYN)  IV  2.25 g Intravenous 3 times per day  . pravastatin  20 mg Oral Daily  . pregabalin  75 mg Oral Daily  . QUEtiapine  25 mg Oral Daily  . senna-docusate  2 tablet Oral BID  . sodium chloride  3 mL Intravenous Q12H  . [START ON 09/11/2014] vancomycin  1,000 mg Intravenous Q M,W,F-HD

## 2014-09-10 NOTE — Progress Notes (Signed)
Patient refused  Enema,not cooperating and spoke with wife last pm and he is not agreeable  To this procedure. Will offer laxative later today when confusion clears.

## 2014-09-10 NOTE — Progress Notes (Addendum)
[facility]                                                                              Patient Demographics  Aaron Long, is a 74 y.o. male, DOB - 1940/12/16, JWJ:[id number]  Admit date - 09/08/2014   Admitting Physician Ivor Costa, MD  Outpatient Primary MD for the patient is [name], NP  LOS - 2   Chief Complaint  Patient presents with  . Fever  . Altered Mental Status      HPI on 09/09/2014 by Dr. Ivor Costa Aaron Long is a 74 y.o. male with PMH of ESRD-HD (MWF), CAD (s/p of CABG), hypertension, hyperlipidemia, diastolic congestive heart failure (EF of 60-65%), GERD, depression, carotid artery stenosis (s/p of right carotid artery endarterectomy), DVT, prostate cancer (s/p of surgery), BPH, bilateral BKA, who presents with fever, chills, confusion. Patient reports that he started having fever and chills since this morning, but no other symptoms, such as cough, runny nose, sore throat, chest pain, abdominal pain, diarrhea, symptoms of UTI, skin infection, ear pain, headache, neck rigidity, photophobia. No tenderness over his dialysis catheter in right upper chest. His wife reports that the patient was mildly confused, which has already resolved in the emergency room. She does not have rashes, unilateral weakness. In ED, patient was found to have temperature 102.6, lactate 2.44, WBC 10.2, tachycardia, negative urinalysis, creatinine 4.73, BUN 37, negative chest x-ray for acute abnormalities. Patient is admitted to inpatient for further eval and treatment.  Interim history Admitted for sepsis, no known cause.  Possibly permcath- removed today and tip sent for culture.  Continue vanc/zosyn.  Assessment & Plan   Sepsis secondary to unknown cause  -Upon admission, patient was febrile with tachycardia -Continues to febrile, Tmax 100.79F (past 24hrs) -? PermCath- removed today and tip sent for culture -Continue vancomycin and Zosyn -Blood cultures show no growth  to date -Chest x-ray shows no acute pulmonary findings -UA negative for infection  Acute encephalopathy -Seems to be at baseline -Likely secondary to sepsis -CT head: No acute intracranial process  End-stage renal disease -Nephrology consulted and appreciated -Patient dialyzes MWF  Constipation -Tap water enema ordered -Possibly leading to sepsis versus encephalopathy  Hypertension -Continue amlodipine and hydralazine as necessary  Diabetes mellitus, type II with neuropathy -Hemoglobin A1c 7.2 -Continue insulin sliding scale CBG monitoring -Continue Lyrica for neuropathy  Diastolic congestive heart failure -Echocardiogram 320 90,016 shows EF 60-65% with grade 1 diastolic dysfunction -Currently appears to be euvolemic and compensated. -Managed by dialysis  Coronary artery disease -Currently no chest pain -Continue aspirin, Plavix, statin  GERD -Continue Pepcid  Anemia of chronic disease -Hemoglobin currently stable, 10.4 -Continue to monitor CBC  Hyperlipidemia -Continue statin -LDL 97 (on 04/02/2014)  History prostate cancer -s/p surgery   Code Status: Full  Family Communication:  None at bedside   Disposition Plan: Admitted   Time Spent in minutes   30 minutes  Procedures  Removal of catheter  Consults   Nephrology  Vascular surgery  DVT Prophylaxis  heparin   Lab Results  Component Value Date   PLT 163 09/10/2014    Medications  Scheduled Meds: . amLODipine  10 mg  Oral QHS  . aspirin EC  81 mg Oral q morning - 10a  . calcium acetate  667 mg Oral TID WC  . clopidogrel  75 mg Oral Daily  . [START ON 09/11/2014] darbepoetin (ARANESP) injection - DIALYSIS  25 mcg Intravenous Q Wed-HD  . doxazosin  1 mg Oral QHS  . [START ON 09/11/2014] doxercalciferol  5 mcg Intravenous Q M,W,F-HD  . famotidine  20 mg Oral QHS  . heparin  5,000 Units Subcutaneous 3 times per day  . insulin aspart  0-9 Units Subcutaneous TID WC  . insulin detemir  18 Units  Subcutaneous BID  . lidocaine  10 mL Intradermal Once  . multivitamin  1 tablet Oral QHS  . piperacillin-tazobactam (ZOSYN)  IV  2.25 g Intravenous 3 times per day  . pravastatin  20 mg Oral Daily  . pregabalin  75 mg Oral Daily  . QUEtiapine  25 mg Oral Daily  . senna-docusate  2 tablet Oral BID  . sodium chloride  3 mL Intravenous Q12H  . [START ON 09/11/2014] vancomycin  1,000 mg Intravenous Q M,W,F-HD   Continuous Infusions:  PRN Meds:.acetaminophen **OR** acetaminophen, hydrALAZINE, HYDROcodone-acetaminophen, ondansetron  Antibiotics    Anti-infectives    Start     Dose/Rate Route Frequency Ordered Stop   09/11/14 1200  vancomycin (VANCOCIN) IVPB 1000 mg/200 mL premix     1,000 mg 200 mL/hr over 60 Minutes Intravenous Every M-W-F (Hemodialysis) 09/09/14 1215     09/09/14 1800  vancomycin (VANCOCIN) IVPB 1000 mg/200 mL premix  Status:  Discontinued     1,000 mg 200 mL/hr over 60 Minutes Intravenous Every M-W-F (Hemodialysis) 09/09/14 1203 09/09/14 1215   09/09/14 1600  vancomycin (VANCOCIN) 1,750 mg in sodium chloride 0.9 % 500 mL IVPB     1,750 mg 250 mL/hr over 120 Minutes Intravenous  Once 09/09/14 1215 09/09/14 1854   09/09/14 1300  vancomycin (VANCOCIN) IVPB 750 mg/150 ml premix  Status:  Discontinued     750 mg 150 mL/hr over 60 Minutes Intravenous  Once 09/09/14 1203 09/09/14 1215   09/09/14 0600  piperacillin-tazobactam (ZOSYN) IVPB 2.25 g     2.25 g 100 mL/hr over 30 Minutes Intravenous 3 times per day 09/08/14 2353     09/08/14 2345  piperacillin-tazobactam (ZOSYN) IVPB 3.375 g  Status:  Discontinued     3.375 g 100 mL/hr over 30 Minutes Intravenous 3 times per day 09/08/14 2343 09/08/14 2352   09/08/14 2230  piperacillin-tazobactam (ZOSYN) IVPB 3.375 g     3.375 g 100 mL/hr over 30 Minutes Intravenous  Once 09/08/14 2220 09/08/14 2301   09/08/14 2230  vancomycin (VANCOCIN) IVPB 1000 mg/200 mL premix  Status:  Discontinued     1,000 mg 200 mL/hr over 60 Minutes  Intravenous Every 48 hours 09/08/14 2227 09/09/14 1155        Subjective:   Aaron Long seen and examined today.  Patient denies chest pain, shortness of breath, abdominal pain, nausea or vomiting. States he does not remember dialyzing yesterday.  Still has not had a bowel movement.   Objective:   Filed Vitals:   09/09/14 1840 09/09/14 2015 09/10/14 0507 09/10/14 0736  BP: 119/53 148/53 138/63 145/85  Pulse: 102 51 72 79  Temp: 99 F (37.2 C) 98.6 F (37 C) 98 F (36.7 C) 98.3 F (36.8 C)  TempSrc: Axillary Oral Oral Oral  Resp: 18 16 16 16   Height:      Weight:  SpO2: 98% 95% 94% 99%    Wt Readings from Last 3 Encounters:  09/09/14 84.6 kg (186 lb 8.2 oz)  06/19/14 92.987 kg (205 lb)  06/11/14 92.987 kg (205 lb)     Intake/Output Summary (Last 24 hours) at 09/10/14 1240 Last data filed at 09/10/14 0900  Gross per 24 hour  Intake    959 ml  Output   1304 ml  Net   -345 ml    Exam  General: Well developed, well nourished, No distress  HEENT: NCAT,mucous membranes moist.   Cardiovascular: S1 S2 auscultated, soft SEM,  RRR  Respiratory: Clear to auscultation bilaterally with equal chest rise  Abdomen: Soft, nontender, nondistended, + bowel sounds  Extremities: B/L BKA, stumps wrapped.  Right forearm AVF +B/T.    Neuro: AAOx3, nonfocal, slow to respond to questions  Data Review   Micro Results Recent Results (from the past 240 hour(s))  Urine culture     Status: None   Collection Time: 09/08/14  8:13 PM  Result Value Ref Range Status   Specimen Description URINE, CLEAN CATCH  Final   Special Requests NONE  Final   Culture MULTIPLE SPECIES PRESENT, SUGGEST RECOLLECTION  Final   Report Status 09/10/2014 FINAL  Final  Culture, blood (routine x 2)     Status: None (Preliminary result)   Collection Time: 09/08/14  8:35 PM  Result Value Ref Range Status   Specimen Description BLOOD LEFT HAND  Final   Special Requests BOTTLES DRAWN AEROBIC ONLY  1CC  Final   Culture NO GROWTH < 24 HOURS  Final   Report Status PENDING  Incomplete  Culture, blood (routine x 2)     Status: None (Preliminary result)   Collection Time: 09/08/14 10:10 PM  Result Value Ref Range Status   Specimen Description BLOOD LEFT HAND  Final   Special Requests   Final    BOTTLES DRAWN AEROBIC AND ANAEROBIC 2CC ANA 3CC AER   Culture NO GROWTH < 24 HOURS  Final   Report Status PENDING  Incomplete  MRSA PCR Screening     Status: None   Collection Time: 09/09/14  5:27 AM  Result Value Ref Range Status   MRSA by PCR NEGATIVE NEGATIVE Final    Comment:        The GeneXpert MRSA Assay (FDA approved for NASAL specimens only), is one component of a comprehensive MRSA colonization surveillance program. It is not intended to diagnose MRSA infection nor to guide or monitor treatment for MRSA infections.     Radiology Reports Dg Chest 2 View  09/08/2014   CLINICAL DATA:  Fever of 2-3 days duration.  EXAM: CHEST  2 VIEW  COMPARISON:  04/19/2014  FINDINGS: There is a right internal jugular central line extending into the SVC. There is mild unchanged left hemidiaphragm elevation and mild unchanged linear left base scarring. The lungs are otherwise clear. The pulmonary vasculature is normal. There is no pleural effusion. There is prior sternotomy and CABG.  IMPRESSION: Mild linear left base scarring.  No acute cardiopulmonary findings.   Electronically Signed   By: Andreas Newport M.D.   On: 09/08/2014 21:08   Ct Head Wo Contrast  09/08/2014   CLINICAL DATA:  Initial evaluation for acute altered mental status.  EXAM: CT HEAD WITHOUT CONTRAST  TECHNIQUE: Contiguous axial images were obtained from the base of the skull through the vertex without intravenous contrast.  COMPARISON:  Prior study from 04/17/2014.  FINDINGS: Atrophy with chronic microvascular ischemic  disease present. Vascular calcifications present within the carotid siphons and distal vertebral arteries.  No  acute large vessel territory infarct. No mass lesion, midline shift, or mass effect. Asymmetric hypodensity adjacent to the atrium of the left lateral ventricle a is grossly similar to previous. No associated edema. This is of doubtful clinical significance. No hydrocephalus. No extra-axial fluid collection.  Scalp soft tissues within normal limits. No acute abnormality about the orbits.  Paranasal sinuses and mastoid air cells are clear. Calvarium intact.  IMPRESSION: 1. No acute intracranial process. 2. Generalized cerebral atrophy with chronic microvascular ischemic disease, stable.   Electronically Signed   By: Jeannine Boga M.D.   On: 09/08/2014 23:19    CBC  Recent Labs Lab 09/08/14 2035 09/09/14 0250 09/09/14 1420 09/10/14 0738  WBC 10.2 9.0 9.0 8.8  HGB 10.3* 10.6* 10.1* 10.4*  HCT 31.3* 33.3* 31.5* 31.8*  PLT 168 177 185 163  MCV 91.8 93.0 92.9 93.5  MCH 30.2 29.6 29.8 30.6  MCHC 32.9 31.8 32.1 32.7  RDW 15.4 15.5 15.5 15.7*  LYMPHSABS 1.8  --  1.6  --   MONOABS 0.6  --  0.5  --   EOSABS 0.1  --  0.1  --   BASOSABS 0.0  --  0.0  --     Chemistries   Recent Labs Lab 09/08/14 2035 09/09/14 0250 09/09/14 1420 09/10/14 0738  NA 130* 150* 133* 136  K 4.7 4.1 4.4 4.8  CL 98* 86* 104 101  CO2 20* 16* 22 27  GLUCOSE 457* 356* 146* 230*  BUN 37* 32* 38* 30*  CREATININE 4.73* 4.11* 4.69* 4.37*  CALCIUM 8.4* 5.4* 9.3 9.0  AST 16  --   --   --   ALT 9*  --   --   --   ALKPHOS 108  --   --   --   BILITOT 0.5  --   --   --    ------------------------------------------------------------------------------------------------------------------ estimated creatinine clearance is 15.7 mL/min (by C-G formula based on Cr of 4.37). ------------------------------------------------------------------------------------------------------------------ No results for input(s): HGBA1C in the last 72  hours. ------------------------------------------------------------------------------------------------------------------ No results for input(s): CHOL, HDL, LDLCALC, TRIG, CHOLHDL, LDLDIRECT in the last 72 hours. ------------------------------------------------------------------------------------------------------------------ No results for input(s): TSH, T4TOTAL, T3FREE, THYROIDAB in the last 72 hours.  Invalid input(s): FREET3 ------------------------------------------------------------------------------------------------------------------ No results for input(s): VITAMINB12, FOLATE, FERRITIN, TIBC, IRON, RETICCTPCT in the last 72 hours.  Coagulation profile  Recent Labs Lab 09/09/14 0037  INR 1.05    No results for input(s): DDIMER in the last 72 hours.  Cardiac Enzymes No results for input(s): CKMB, TROPONINI, MYOGLOBIN in the last 168 hours.  Invalid input(s): CK ------------------------------------------------------------------------------------------------------------------ Invalid input(s): POCBNP    Teren Zurcher D.O. on 09/10/2014 at 12:40 PM  Between 7am to 7pm - Pager - [phone]  After 7pm go to [email or web address] - password [email or web address]  And look for the night coverage person covering for me after hours  [facility] Office  [phone]

## 2014-09-10 NOTE — Progress Notes (Signed)
  [facility] Catheter Removal Procedure Note  Diagnosis: ESRD  Plan:  Remove right diatek catheter  Consent signed:  Yes.   Time out completed:  Yes.   Coumadin:  No. PT/INR (if applicable):   Other labs:  Procedure: 1.  Sterile prepping and draping over catheter area 2. 10 ml 2% lidocaine plain instilled at removal site. 3.  right catheter removed in its entirety with cuff in tact. 4.  Complications:  none 5. Tip of catheter sent for culture:  Yes.     Patient tolerated procedure well:  Yes.   Pressure held, no bleeding noted, dressing applied  Keep patient upright at 45 degree angle or higher for 4 hours. Do not have patient lie flat for 4 hours. If bleeding occurs, hold pressure to top dressing until bleeding ceases.    Virgina Jock, PA-C 09/10/2014 11:44 AM

## 2014-09-10 NOTE — Progress Notes (Signed)
[facility] H&P  CC:  Catheter removal   HPI:  This is a 74 y.o. male who presented with fever and chills yesterday morning and admitted for sepsis of unknown etiology. [facility]VS was consulted for removal of right IJ tunneled dialysis catheter to rule catheter related bacteremia. He has a functioning right radiocephalic AVF (created 1/0/17 by Dr. Kellie Simmering) that has been used successfully.   The patient reports feeling better this morning. His last HD was yesterday.   Past Medical History  Diagnosis Date  . PAD (peripheral artery disease)     a. R CEA 2007. b. Hx L BKA in 2013. c. s/p LE angioplasty in 2014. d. Hx R BKA in 2014.  . Gangrene of toe     dry  . Neuromuscular disorder     diabetic neuropathy  . Hypertension   . Hyperlipidemia   . Peripheral neuropathy   . Constipation     takes Miralax daily  . Hemorrhoids   . Hx of colonic polyps   . Coronary artery disease     a. s/p NSTEMI/CABGx4 in 2007 - LIMA-LAD, SVG-optional diagonal, SVVG-OM, SVG-dRCA. b. Nuc 05/2011: nonischemic.  [name] BPH (benign prostatic hypertrophy)   . Abnormality of gait   . DVT (deep venous thrombosis)     a. Lower extremity DVT in 2013.  [name] Hypertrophy of prostate without urinary obstruction and other lower urinary tract symptoms (LUTS)   . Disorder of bone and cartilage, unspecified   . Lower limb amputation, below knee   . Anemia   . Secondary hyperparathyroidism     Secondary Hyperpara- Thyroidism, Renal  . Ischemic cardiomyopathy     a. EF 40% in 2007. b. 51% by nuc 05/2011.  [name] Complication of anesthesia     "he gets delirious"  . Myocardial infarction 2005  . Type II diabetes mellitus   . Diabetic peripheral neuropathy   . ESRD on dialysis   . Kidney stones   . Prostate cancer 2010    FH:  Non-Contributory  Social History   Social History  . Marital Status: Married    Spouse Name: N/A  . Number of Children: 3  . Years of Education: N/A   Occupational History  .  Retired-[employer]    Social History Main Topics  . Smoking status: Former Smoker -- 1.00 packs/day for 30 years    Types: Cigarettes    Quit date: 04/28/1991  . Smokeless tobacco: Never Used  . Alcohol Use: Yes     Comment: 04/17/2014 "might have a glass of wine at Christmas"  . Drug Use: No  . Sexual Activity: No   Other Topics Concern  . Not on file   Social History Narrative    Allergies  Allergen Reactions  . Oxycodone Other (See Comments)    Hallucinations    Current Facility-Administered Medications  Medication Dose Route Frequency Provider Last Rate Last Dose  . acetaminophen (TYLENOL) tablet 650 mg  650 mg Oral Q6H PRN Ivor Costa, MD   650 mg at 09/09/14 2239   Or  . acetaminophen (TYLENOL) suppository 650 mg  650 mg Rectal Q6H PRN Ivor Costa, MD      . amLODipine (NORVASC) tablet 10 mg  10 mg Oral QHS Ernest Haber, PA-C   10 mg at 09/09/14 2232  . aspirin EC tablet 81 mg  81 mg Oral q morning - 10a Ivor Costa, MD   81 mg at 09/09/14 1831  . calcium acetate (PHOSLO) capsule  667 mg  667 mg Oral TID WC Ivor Costa, MD   667 mg at 09/10/14 0841  . clopidogrel (PLAVIX) tablet 75 mg  75 mg Oral Daily Ivor Costa, MD   75 mg at 09/09/14 1830  . doxazosin (CARDURA) tablet 1 mg  1 mg Oral QHS Ivor Costa, MD   1 mg at 09/09/14 2238  . famotidine (PEPCID) tablet 20 mg  20 mg Oral QHS Ivor Costa, MD   20 mg at 09/09/14 2232  . heparin injection 5,000 Units  5,000 Units Subcutaneous 3 times per day Ivor Costa, MD   5,000 Units at 09/10/14 [phone]  . hydrALAZINE (APRESOLINE) injection 5 mg  5 mg Intravenous Q2H PRN Ivor Costa, MD   5 mg at 09/09/14 0521  . HYDROcodone-acetaminophen (NORCO/VICODIN) 5-325 MG per tablet 1 tablet  1 tablet Oral Q6H PRN Ivor Costa, MD      . insulin aspart (novoLOG) injection 0-9 Units  0-9 Units Subcutaneous TID WC Ivor Costa, MD   3 Units at 09/10/14 0840  . insulin detemir (LEVEMIR) injection 18 Units  18 Units Subcutaneous BID Ivor Costa, MD   18 Units at  09/09/14 2233  . lidocaine (XYLOCAINE) 2 % injection 0-20 mL  0-20 mL Intradermal Once PRN Alvia Grove, PA-C      . lidocaine (XYLOCAINE) 2 % injection 10 mL  10 mL Intradermal Once Alvia Grove, PA-C      . multivitamin (RENA-VIT) tablet 1 tablet  1 tablet Oral QHS Ivor Costa, MD   1 tablet at 09/09/14 2233  . ondansetron (ZOFRAN) injection 4 mg  4 mg Intravenous Q8H PRN Ivor Costa, MD      . piperacillin-tazobactam (ZOSYN) IVPB 2.25 g  2.25 g Intravenous 3 times per day Ivor Costa, MD   2.25 g at 09/10/14 9201  . pravastatin (PRAVACHOL) tablet 20 mg  20 mg Oral Daily Ivor Costa, MD   20 mg at 09/09/14 1830  . pregabalin (LYRICA) capsule 75 mg  75 mg Oral Daily Ivor Costa, MD   75 mg at 09/09/14 1024  . QUEtiapine (SEROQUEL) tablet 25 mg  25 mg Oral Daily Ivor Costa, MD   25 mg at 09/09/14 1024  . senna-docusate (Senokot-S) tablet 2 tablet  2 tablet Oral BID Ivor Costa, MD   2 tablet at 09/09/14 2232  . sodium chloride 0.9 % injection 3 mL  3 mL Intravenous Q12H Ivor Costa, MD   3 mL at 09/09/14 2235  . [START ON 09/11/2014] vancomycin (VANCOCIN) IVPB 1000 mg/200 mL premix  1,000 mg Intravenous Q M,W,F-HD Eudelia Bunch, RPH        ROS:  See HPI  PHYSICAL EXAM  Filed Vitals:   09/10/14 0736  BP: 145/85  Pulse: 79  Temp: 98.3 F (36.8 C)  Resp: 16    Gen:  Well developed well nourished HEENT:  normocephalic Neck:  Right IJ TDC Lungs:  Non-labored Extremities:  Bilateral BKAs Skin:  No obvious rashes Neuro:  No focal deficits  Impression: This is a 74 y.o. male here for diatek catheter removal  Plan:  Removal of right diatek catheter  Virgina Jock, PA-C [facility] [phone] 09/10/2014 11:38 AM

## 2014-09-10 NOTE — Consult Note (Signed)
   [facility] CM Inpatient Consult   09/10/2014  Aaron Long [birth year] [id number] Referral received.  Met with the patient at the bedside.  Explained [facility] services.  Patient states he is somewhat interested but would like to speak with his wife about the services.  Patient states he goes to dialysis three times a week on a Monday, Wednesday, and Friday schedule.  He states that he just has not been feeling good lately.  He states, "my wife is probably getting tired of having to take care of everything.  Do you all do any housekeeping services?"  Explained that our services are for medical management but we also work with finding resources and guidance for issues and we involve our social workers.  Patient requested that his wife been given a brochure and contact information.  Left brochure and business card at the bedside.  Spoke with inpatient RNCM regarding referral.  For any questions, please contact: Natividad Brood, RN BSN [facility]iaison  [phone] business mobile phone

## 2014-09-11 ENCOUNTER — Encounter: Payer: Medicare Other | Admitting: Physical Therapy

## 2014-09-11 DIAGNOSIS — N186 End stage renal disease: Secondary | ICD-10-CM | POA: Diagnosis not present

## 2014-09-11 DIAGNOSIS — A419 Sepsis, unspecified organism: Principal | ICD-10-CM

## 2014-09-11 DIAGNOSIS — G9340 Encephalopathy, unspecified: Secondary | ICD-10-CM

## 2014-09-11 DIAGNOSIS — Z992 Dependence on renal dialysis: Secondary | ICD-10-CM

## 2014-09-11 DIAGNOSIS — E1122 Type 2 diabetes mellitus with diabetic chronic kidney disease: Secondary | ICD-10-CM | POA: Diagnosis not present

## 2014-09-11 DIAGNOSIS — Z89511 Acquired absence of right leg below knee: Secondary | ICD-10-CM

## 2014-09-11 DIAGNOSIS — Z89512 Acquired absence of left leg below knee: Secondary | ICD-10-CM

## 2014-09-11 DIAGNOSIS — K5909 Other constipation: Secondary | ICD-10-CM

## 2014-09-11 DIAGNOSIS — R509 Fever, unspecified: Secondary | ICD-10-CM

## 2014-09-11 LAB — BASIC METABOLIC PANEL
Anion gap: 9 (ref 5–15)
BUN: 39 mg/dL — AB (ref 6–20)
CO2: 25 mmol/L (ref 22–32)
CREATININE: 5.13 mg/dL — AB (ref 0.61–1.24)
Calcium: 9.2 mg/dL (ref 8.9–10.3)
Chloride: 103 mmol/L (ref 101–111)
GFR calc Af Amer: 12 mL/min — ABNORMAL LOW (ref >60)
GFR, EST NON AFRICAN AMERICAN: 10 mL/min — AB (ref >60)
GLUCOSE: 86 mg/dL (ref 65–99)
POTASSIUM: 4 mmol/L (ref 3.5–5.1)
SODIUM: 137 mmol/L (ref 135–145)

## 2014-09-11 LAB — CBC
HCT: 30.4 % — ABNORMAL LOW (ref 39.0–52.0)
Hemoglobin: 9.6 g/dL — ABNORMAL LOW (ref 13.0–17.0)
MCH: 29.3 pg (ref 26.0–34.0)
MCHC: 31.6 g/dL (ref 30.0–36.0)
MCV: 92.7 fL (ref 78.0–100.0)
PLATELETS: 196 10*3/uL (ref 150–400)
RBC: 3.28 MIL/uL — AB (ref 4.22–5.81)
RDW: 15.8 % — AB (ref 11.5–15.5)
WBC: 9.6 10*3/uL (ref 4.0–10.5)

## 2014-09-11 LAB — GLUCOSE, CAPILLARY
GLUCOSE-CAPILLARY: 139 mg/dL — AB (ref 65–99)
Glucose-Capillary: 114 mg/dL — ABNORMAL HIGH (ref 65–99)
Glucose-Capillary: 215 mg/dL — ABNORMAL HIGH (ref 65–99)

## 2014-09-11 LAB — VANCOMYCIN, RANDOM: Vancomycin Rm: 11 ug/mL

## 2014-09-11 MED ORDER — PIPERACILLIN-TAZOBACTAM IN DEX 2-0.25 GM/50ML IV SOLN
2.2500 g | Freq: Three times a day (TID) | INTRAVENOUS | Status: DC
  Administered 2014-09-11 – 2014-09-12 (×4): 2.25 g via INTRAVENOUS
  Filled 2014-09-11 (×7): qty 50

## 2014-09-11 MED ORDER — DOXERCALCIFEROL 4 MCG/2ML IV SOLN
INTRAVENOUS | Status: AC
  Filled 2014-09-11: qty 4

## 2014-09-11 MED ORDER — VANCOMYCIN HCL 1000 MG IV SOLR
1250.0000 mg | Freq: Once | INTRAVENOUS | Status: AC
  Administered 2014-09-11: 1250 mg via INTRAVENOUS
  Filled 2014-09-11 (×2): qty 1250

## 2014-09-11 MED ORDER — VANCOMYCIN HCL IN DEXTROSE 1-5 GM/200ML-% IV SOLN
1000.0000 mg | INTRAVENOUS | Status: DC
  Filled 2014-09-11 (×2): qty 200

## 2014-09-11 MED ORDER — DARBEPOETIN ALFA 25 MCG/0.42ML IJ SOSY
PREFILLED_SYRINGE | INTRAMUSCULAR | Status: AC
  Filled 2014-09-11: qty 0.42

## 2014-09-11 NOTE — Progress Notes (Signed)
[facility]                                                                              Patient Demographics  Aaron Long, is a 74 y.o. male, DOB - [birth year], FOY:[id number]  Admit date - 09/08/2014   Admitting Physician Ivor Costa, MD  Outpatient Primary MD for the patient is [name], NP  LOS - 3   Chief Complaint  Patient presents with  . Fever  . Altered Mental Status      HPI on 09/09/2014 by Dr. Ivor Costa Aaron Long is a 74 y.o. male with PMH of ESRD-HD (MWF), CAD (s/p of CABG), hypertension, hyperlipidemia, diastolic congestive heart failure (EF of 60-65%), GERD, depression, carotid artery stenosis (s/p of right carotid artery endarterectomy), DVT, prostate cancer (s/p of surgery), BPH, bilateral BKA, who presents with fever, chills, confusion. Patient reports that he started having fever and chills since this morning, but no other symptoms, such as cough, runny nose, sore throat, chest pain, abdominal pain, diarrhea, symptoms of UTI, skin infection, ear pain, headache, neck rigidity, photophobia. No tenderness over his dialysis catheter in right upper chest. His wife reports that the patient was mildly confused, which has already resolved in the emergency room. She does not have rashes, unilateral weakness. In ED, patient was found to have temperature 102.6, lactate 2.44, WBC 10.2, tachycardia, negative urinalysis, creatinine 4.73, BUN 37, negative chest x-ray for acute abnormalities. Patient is admitted to inpatient for further eval and treatment.  Interim history Fever 101 9pm yesterday, reported feeling well, daughter in room.  permcath- removed on 8/30, tip sent for culture.  Result pending, Continue vanc/zosyn.  Assessment & Plan   Sepsis secondary to unknown cause  -Upon admission, patient was febrile with tachycardia -Continues to febrile, Tmax 101F (past 24hrs) -? PermCath- removed on 8/30 and tip sent for culture -Continue vancomycin and  Zosyn per pharmacy -Blood cultures show no growth to date -Chest x-ray shows no acute pulmonary findings -UA negative for infection  Acute encephalopathy -Seems returned to baseline -Likely secondary to sepsis -CT head: No acute intracranial process  End-stage renal disease -Nephrology consulted and appreciated -Patient dialyzes MWF  Constipation -s/p Tap water enema, on miralax/senokot-s   Hypertension -Continue amlodipine and hydralazine as necessary  Diabetes mellitus, type II with neuropathy -Hemoglobin A1c 7.2 -Continue insulin sliding scale CBG monitoring -Continue Lyrica for neuropathy  Diastolic congestive heart failure -Echocardiogram 320 90,016 shows EF 60-65% with grade 1 diastolic dysfunction -Currently appears to be euvolemic and compensated. -Managed by dialysis  Coronary artery disease -Currently no chest pain -Continue aspirin, Plavix, statin  GERD -Continue Pepcid  Anemia of chronic disease -Hemoglobin currently stable, 10.4 -Continue to monitor CBC  Hyperlipidemia -Continue statin -LDL 97 (on 04/02/2014)  History prostate cancer -s/p surgery   Code Status: Full  Family Communication:  Patient and daughter at bedside   Disposition Plan: home when medically stable, likely in 1-2 days, if fever free, and culture result finalized.  Time Spent in minutes   35 minutes  Procedures  Removal of catheter  Consults   Nephrology  Vascular surgery  DVT Prophylaxis  heparin   Lab Results  Component  Value Date   PLT 196 09/11/2014    Medications  Scheduled Meds: . amLODipine  10 mg Oral QHS  . aspirin EC  81 mg Oral q morning - 10a  . calcium acetate  667 mg Oral TID WC  . clopidogrel  75 mg Oral Daily  . darbepoetin (ARANESP) injection - DIALYSIS  25 mcg Intravenous Q Wed-HD  . doxazosin  1 mg Oral QHS  . doxercalciferol  5 mcg Intravenous Q M,W,F-HD  . famotidine  20 mg Oral QHS  . heparin  5,000 Units Subcutaneous 3 times per day   . insulin aspart  0-9 Units Subcutaneous TID WC  . insulin detemir  18 Units Subcutaneous BID  . lidocaine  10 mL Intradermal Once  . multivitamin  1 tablet Oral QHS  . piperacillin-tazobactam (ZOSYN)  IV  2.25 g Intravenous 3 times per day  . polyethylene glycol  17 g Oral Daily  . pravastatin  20 mg Oral Daily  . pregabalin  75 mg Oral Daily  . QUEtiapine  25 mg Oral Daily  . senna-docusate  2 tablet Oral BID  . sodium chloride  3 mL Intravenous Q12H  . [START ON 09/13/2014] vancomycin  1,000 mg Intravenous Q M,W,F-HD   Continuous Infusions:  PRN Meds:.acetaminophen **OR** acetaminophen, hydrALAZINE, HYDROcodone-acetaminophen, ondansetron  Antibiotics    Anti-infectives    Start     Dose/Rate Route Frequency Ordered Stop   09/13/14 1200  vancomycin (VANCOCIN) IVPB 1000 mg/200 mL premix     1,000 mg 200 mL/hr over 60 Minutes Intravenous Every M-W-F (Hemodialysis) 09/11/14 0901     09/11/14 1200  vancomycin (VANCOCIN) IVPB 1000 mg/200 mL premix  Status:  Discontinued     1,000 mg 200 mL/hr over 60 Minutes Intravenous Every M-W-F (Hemodialysis) 09/09/14 1215 09/11/14 0901   09/11/14 1100  vancomycin (VANCOCIN) 1,250 mg in sodium chloride 0.9 % 250 mL IVPB     1,250 mg 250 mL/hr over 60 Minutes Intravenous  Once 09/11/14 0901 09/11/14 1146   09/09/14 1800  vancomycin (VANCOCIN) IVPB 1000 mg/200 mL premix  Status:  Discontinued     1,000 mg 200 mL/hr over 60 Minutes Intravenous Every M-W-F (Hemodialysis) 09/09/14 1203 09/09/14 1215   09/09/14 1600  vancomycin (VANCOCIN) 1,750 mg in sodium chloride 0.9 % 500 mL IVPB     1,750 mg 250 mL/hr over 120 Minutes Intravenous  Once 09/09/14 1215 09/09/14 1854   09/09/14 1300  vancomycin (VANCOCIN) IVPB 750 mg/150 ml premix  Status:  Discontinued     750 mg 150 mL/hr over 60 Minutes Intravenous  Once 09/09/14 1203 09/09/14 1215   09/09/14 0600  piperacillin-tazobactam (ZOSYN) IVPB 2.25 g     2.25 g 100 mL/hr over 30 Minutes Intravenous 3  times per day 09/08/14 2353     09/08/14 2345  piperacillin-tazobactam (ZOSYN) IVPB 3.375 g  Status:  Discontinued     3.375 g 100 mL/hr over 30 Minutes Intravenous 3 times per day 09/08/14 2343 09/08/14 2352   09/08/14 2230  piperacillin-tazobactam (ZOSYN) IVPB 3.375 g     3.375 g 100 mL/hr over 30 Minutes Intravenous  Once 09/08/14 2220 09/08/14 2301   09/08/14 2230  vancomycin (VANCOCIN) IVPB 1000 mg/200 mL premix  Status:  Discontinued     1,000 mg 200 mL/hr over 60 Minutes Intravenous Every 48 hours 09/08/14 2227 09/09/14 1155        Subjective:   Aaron Long seen and examined today.  Patient denies chest pain, shortness of breath,  abdominal pain, nausea or vomiting. States he does not remember dialyzing yesterday.  Still has not had a bowel movement.   Objective:   Filed Vitals:   09/11/14 1100 09/11/14 1130 09/11/14 1149 09/11/14 1155  BP: 129/68 102/57 138/66 147/76  Pulse: 82 84 87 88  Temp:    97.9 F (36.6 C)  TempSrc:    Oral  Resp:   10 10  Height:      Weight:    176 lb 9.4 oz (80.1 kg)  SpO2:        Wt Readings from Last 3 Encounters:  09/11/14 176 lb 9.4 oz (80.1 kg)  06/19/14 205 lb (92.987 kg)  06/11/14 205 lb (92.987 kg)     Intake/Output Summary (Last 24 hours) at 09/11/14 1744 Last data filed at 09/11/14 1544  Gross per 24 hour  Intake    410 ml  Output   2721 ml  Net  -2311 ml    Exam  General: Well developed, well nourished, No distress  HEENT: NCAT,mucous membranes moist.   Cardiovascular: S1 S2 auscultated, soft SEM,  RRR  Respiratory: Clear to auscultation bilaterally with equal chest rise  Abdomen: Soft, nontender, nondistended, + bowel sounds  Extremities: B/L BKA, stumps wrapped.  Right forearm AVF +B/T.    Neuro: AAOx3, nonfocal, slow to respond to questions  Data Review   Micro Results Recent Results (from the past 240 hour(s))  Urine culture     Status: None   Collection Time: 09/08/14  8:13 PM  Result Value Ref  Range Status   Specimen Description URINE, CLEAN CATCH  Final   Special Requests NONE  Final   Culture MULTIPLE SPECIES PRESENT, SUGGEST RECOLLECTION  Final   Report Status 09/10/2014 FINAL  Final  Culture, blood (routine x 2)     Status: None (Preliminary result)   Collection Time: 09/08/14  8:35 PM  Result Value Ref Range Status   Specimen Description BLOOD LEFT HAND  Final   Special Requests BOTTLES DRAWN AEROBIC ONLY [place]  Final   Culture NO GROWTH 3 DAYS  Final   Report Status PENDING  Incomplete  Culture, blood (routine x 2)     Status: None (Preliminary result)   Collection Time: 09/08/14 10:10 PM  Result Value Ref Range Status   Specimen Description BLOOD LEFT HAND  Final   Special Requests   Final    BOTTLES DRAWN AEROBIC AND ANAEROBIC 2CC ANA 3CC AER   Culture NO GROWTH 3 DAYS  Final   Report Status PENDING  Incomplete  MRSA PCR Screening     Status: None   Collection Time: 09/09/14  5:27 AM  Result Value Ref Range Status   MRSA by PCR NEGATIVE NEGATIVE Final    Comment:        The GeneXpert MRSA Assay (FDA approved for NASAL specimens only), is one component of a comprehensive MRSA colonization surveillance program. It is not intended to diagnose MRSA infection nor to guide or monitor treatment for MRSA infections.   Cath Tip Culture     Status: None (Preliminary result)   Collection Time: 09/10/14 11:45 AM  Result Value Ref Range Status   Specimen Description CATH TIP  Final   Special Requests NONE  Final   Culture   Final    Culture reincubated for better growth Performed at [facility]    Report Status PENDING  Incomplete    Radiology Reports Dg Chest 2 View  09/08/2014   CLINICAL DATA:  Fever of 2-3 days duration.  EXAM: CHEST  2 VIEW  COMPARISON:  04/19/2014  FINDINGS: There is a right internal jugular central line extending into the SVC. There is mild unchanged left hemidiaphragm elevation and mild unchanged linear left base scarring. The  lungs are otherwise clear. The pulmonary vasculature is normal. There is no pleural effusion. There is prior sternotomy and CABG.  IMPRESSION: Mild linear left base scarring.  No acute cardiopulmonary findings.   Electronically Signed   By: Andreas Newport M.D.   On: 09/08/2014 21:08   Ct Head Wo Contrast  09/08/2014   CLINICAL DATA:  Initial evaluation for acute altered mental status.  EXAM: CT HEAD WITHOUT CONTRAST  TECHNIQUE: Contiguous axial images were obtained from the base of the skull through the vertex without intravenous contrast.  COMPARISON:  Prior study from 04/17/2014.  FINDINGS: Atrophy with chronic microvascular ischemic disease present. Vascular calcifications present within the carotid siphons and distal vertebral arteries.  No acute large vessel territory infarct. No mass lesion, midline shift, or mass effect. Asymmetric hypodensity adjacent to the atrium of the left lateral ventricle a is grossly similar to previous. No associated edema. This is of doubtful clinical significance. No hydrocephalus. No extra-axial fluid collection.  Scalp soft tissues within normal limits. No acute abnormality about the orbits.  Paranasal sinuses and mastoid air cells are clear. Calvarium intact.  IMPRESSION: 1. No acute intracranial process. 2. Generalized cerebral atrophy with chronic microvascular ischemic disease, stable.   Electronically Signed   By: Jeannine Boga M.D.   On: 09/08/2014 23:19    CBC  Recent Labs Lab 09/08/14 2035 09/09/14 0250 09/09/14 1420 09/10/14 0738 09/10/14 2050 09/11/14 0358  WBC 10.2 9.0 9.0 8.8 9.1 9.6  HGB 10.3* 10.6* 10.1* 10.4* 10.5* 9.6*  HCT 31.3* 33.3* 31.5* 31.8* 31.6* 30.4*  PLT 168 177 185 163 184 196  MCV 91.8 93.0 92.9 93.5 92.9 92.7  MCH 30.2 29.6 29.8 30.6 30.9 29.3  MCHC 32.9 31.8 32.1 32.7 33.2 31.6  RDW 15.4 15.5 15.5 15.7* 15.6* 15.8*  LYMPHSABS 1.8  --  1.6  --   --   --   MONOABS 0.6  --  0.5  --   --   --   EOSABS 0.1  --  0.1  --    --   --   BASOSABS 0.0  --  0.0  --   --   --     Chemistries   Recent Labs Lab 09/08/14 2035 09/09/14 0250 09/09/14 1420 09/10/14 0738 09/10/14 2050 09/11/14 0358  NA 130* 150* 133* 136 135 137  K 4.7 4.1 4.4 4.8 4.3 4.0  CL 98* 86* 104 101 101 103  CO2 20* 16* 22 27 26 25   GLUCOSE 457* 356* 146* 230* 121* 86  BUN 37* 32* 38* 30* 36* 39*  CREATININE 4.73* 4.11* 4.69* 4.37* 4.59* 5.13*  CALCIUM 8.4* 5.4* 9.3 9.0 9.0 9.2  AST 16  --   --   --   --   --   ALT 9*  --   --   --   --   --   ALKPHOS 108  --   --   --   --   --   BILITOT 0.5  --   --   --   --   --    ------------------------------------------------------------------------------------------------------------------ estimated creatinine clearance is 12.2 mL/min (by C-G formula based on Cr of 5.13). ------------------------------------------------------------------------------------------------------------------ No results for input(s): HGBA1C in the  last 72 hours. ------------------------------------------------------------------------------------------------------------------ No results for input(s): CHOL, HDL, LDLCALC, TRIG, CHOLHDL, LDLDIRECT in the last 72 hours. ------------------------------------------------------------------------------------------------------------------ No results for input(s): TSH, T4TOTAL, T3FREE, THYROIDAB in the last 72 hours.  Invalid input(s): FREET3 ------------------------------------------------------------------------------------------------------------------ No results for input(s): VITAMINB12, FOLATE, FERRITIN, TIBC, IRON, RETICCTPCT in the last 72 hours.  Coagulation profile  Recent Labs Lab 09/09/14 0037  INR 1.05    No results for input(s): DDIMER in the last 72 hours.  Cardiac Enzymes No results for input(s): CKMB, TROPONINI, MYOGLOBIN in the last 168 hours.  Invalid input(s):  CK ------------------------------------------------------------------------------------------------------------------ Invalid input(s): POCBNP    Albion Weatherholtz MD PhD on 09/11/2014 at 5:44 PM  Between 7am to 7pm - Pager - [phone]  After 7pm go to [email or web address] - password [email or web address]  And look for the night coverage person covering for me after hours  [facility] Office  [phone]

## 2014-09-11 NOTE — Progress Notes (Signed)
ANTIBIOTIC CONSULT NOTE -follow up  Pharmacy Consult for Vancomycin/zosyn Indication: rule out sepsis  Allergies  Allergen Reactions  . Oxycodone Other (See Comments)    Hallucinations    Patient Measurements: Height: 5\' 8"  (172.7 cm) (Before amputation. ) Weight: 186 lb 15.2 oz (84.8 kg) IBW/kg (Calculated) : 68.4   Vital Signs: Temp: 97.8 F (36.6 C) (08/31 0717) Temp Source: Oral (08/31 0717) BP: 130/65 mmHg (08/31 0930) Pulse Rate: 82 (08/31 0930) Intake/Output from previous day: 08/30 0701 - 08/31 0700 In: 720 [P.O.:720] Out: -  Intake/Output from this shift:    Labs:  Recent Labs  09/10/14 0738 09/10/14 2050 09/11/14 0358  WBC 8.8 9.1 9.6  HGB 10.4* 10.5* 9.6*  PLT 163 184 196  CREATININE 4.37* 4.59* 5.13*   Estimated Creatinine Clearance: 13.4 mL/min (by C-G formula based on Cr of 5.13).  Recent Labs  09/11/14 0745  VANCORANDOM 11     Microbiology: Recent Results (from the past 720 hour(s))  Urine culture     Status: None   Collection Time: 09/08/14  8:13 PM  Result Value Ref Range Status   Specimen Description URINE, CLEAN CATCH  Final   Special Requests NONE  Final   Culture MULTIPLE SPECIES PRESENT, SUGGEST RECOLLECTION  Final   Report Status 09/10/2014 FINAL  Final  Culture, blood (routine x 2)     Status: None (Preliminary result)   Collection Time: 09/08/14  8:35 PM  Result Value Ref Range Status   Specimen Description BLOOD LEFT HAND  Final   Special Requests BOTTLES DRAWN AEROBIC ONLY 1CC  Final   Culture NO GROWTH 2 DAYS  Final   Report Status PENDING  Incomplete  Culture, blood (routine x 2)     Status: None (Preliminary result)   Collection Time: 09/08/14 10:10 PM  Result Value Ref Range Status   Specimen Description BLOOD LEFT HAND  Final   Special Requests   Final    BOTTLES DRAWN AEROBIC AND ANAEROBIC 2CC ANA 3CC AER   Culture NO GROWTH 2 DAYS  Final   Report Status PENDING  Incomplete  MRSA PCR Screening     Status: None    Collection Time: 09/09/14  5:27 AM  Result Value Ref Range Status   MRSA by PCR NEGATIVE NEGATIVE Final    Comment:        The GeneXpert MRSA Assay (FDA approved for NASAL specimens only), is one component of a comprehensive MRSA colonization surveillance program. It is not intended to diagnose MRSA infection nor to guide or monitor treatment for MRSA infections.   Cath Tip Culture     Status: None (Preliminary result)   Collection Time: 09/10/14 11:45 AM  Result Value Ref Range Status   Specimen Description CATH TIP  Final   Special Requests NONE  Final   Culture   Final    Culture reincubated for better growth Performed at [facility]    Report Status PENDING  Incomplete     Assessment: 74yo male with ESRD on HD MWF presented with fever and AMS. Pharmacy consulted to dose vancomycin/zosyn for suspected sepsis. CXR neg; UA neg.  AVF has been functioning well, so permcath removed on 8/30.  WBC nml, tmax/24h 101.  Vancomycin level drawn pre-HD today slightly low at 77mcg/mL. If he tolerates a full session, would expect level to be ~41mcg/mL, however likely true post-HD vanc level would be higher since he has been requiring lower BFRs.   Vanc 8/28>> Zosyn 8/28>>  8/28  BCx2: NGTD 8/28 Ucx: multiple species 8/30 cath tip: reincubated  Goal of Therapy:  Pre-HD vanc level 15-25 mcg/ml  Plan:  -vancomycin 1250 mg IV after HD today to achieve higher pre-HD level, then resume vancomycin 1000mg  IV qHD MWF starting 9/2. -Zosyn 2.25g IV q8h - f/u culture data, clinical progression, HD tolerance/schedule, LOT  Jessikah Dicker D. Iden Stripling, PharmD, BCPS Clinical Pharmacist Pager: [phone] 09/11/2014 10:01 AM

## 2014-09-11 NOTE — Progress Notes (Signed)
PT Cancellation Note  Patient Details Name: Aaron Long MRN: [id number] DOB: 27-Oct-1940   Cancelled Treatment:    Reason Eval/Treat Not Completed: Patient at procedure or test/unavailablePatient at procedure or test/ unavailable (HD currently-- will continue to check back as schedule permits).   [facility] 09/11/2014, 9:34 AM

## 2014-09-11 NOTE — Progress Notes (Signed)
OT Cancellation Note  Patient Details Name: Aaron Long MRN: [id number] DOB: March 18, 1940   Cancelled Treatment:    Reason Eval/Treat Not Completed: Patient at procedure or test/ unavailable (HD currently) OT to continue to check back as appropriate.   Parke Poisson B 09/11/2014, 8:56 AM   Jeri Modena   OTR/L Pager: [phone] Office: [phone] .

## 2014-09-11 NOTE — Procedures (Signed)
I have seen and examined this patient and agree with the plan of care . No complaints appears stable on dialysis  [facility] W 09/11/2014, 9:20 AM

## 2014-09-11 NOTE — Care Management Important Message (Signed)
Important Message  Patient Details  Name: Aaron Long MRN: [id number] Date of Birth: Nov 27, 1940   Medicare Important Message Given:  Yes-second notification given    [name] 09/11/2014, 2:25 PM

## 2014-09-12 LAB — GLUCOSE, CAPILLARY
GLUCOSE-CAPILLARY: 65 mg/dL (ref 65–99)
GLUCOSE-CAPILLARY: 115 mg/dL — AB (ref 65–99)
GLUCOSE-CAPILLARY: 117 mg/dL — AB (ref 65–99)
Glucose-Capillary: 232 mg/dL — ABNORMAL HIGH (ref 65–99)

## 2014-09-12 NOTE — Care Management Note (Signed)
Case Management Note  Patient Details  Name: Aaron Long MRN: [id number] Date of Birth: 07/17/1940  Subjective/Objective:             Cm following for progression and d/c planning       Action/Plan: Plan is for short term rehab at SNF, [place] working with pt and family.  Expected Discharge Date:  09/12/2014              Expected Discharge Plan:  Skilled Nursing Facility  In-House Referral:  Clinical Social Work  Discharge planning Services     Post Acute Care Choice:  NA Choice offered to:  NA  DME Arranged:    DME Agency:     HH Arranged:    [place] Agency:     Status of Service:  Completed, signed off  Medicare Important Message Given:  Yes-second notification given Date Medicare IM Given:    Medicare IM give by:    Date Additional Medicare IM Given:    Additional Medicare Important Message give by:     If discussed at [place] of Stay Meetings, dates discussed:    Additional Comments:  [name], RN 09/12/2014, 9:11 AM

## 2014-09-12 NOTE — Progress Notes (Signed)
[facility] ROUNDING NOTE   Subjective:   Interval History: No complaints some hand coldness and numbness on dialysis  When using fistula on right hand  Objective:  Vital signs in last 24 hours:  Temp:  [97.9 F (36.6 C)-98.5 F (36.9 C)] 98.3 F (36.8 C) (09/01 0950) Pulse Rate:  [78-88] 86 (09/01 0950) Resp:  [10-18] 18 (09/01 0950) BP: (102-147)/(50-76) 109/50 mmHg (09/01 0950) SpO2:  [98 %-100 %] 98 % (09/01 0950) Weight:  [80 kg (176 lb 5.9 oz)-80.1 kg (176 lb 9.4 oz)] 80 kg (176 lb 5.9 oz) (08/31 2035)  Weight change: 3.198 kg (7 lb 0.8 oz) Filed Weights   09/11/14 0717 09/11/14 1155 09/11/14 2035  Weight: 84.8 kg (186 lb 15.2 oz) 80.1 kg (176 lb 9.4 oz) 80 kg (176 lb 5.9 oz)    Intake/Output: I/O last 3 completed shifts: In: 630 [P.O.:480; IV Piggyback:150] Out: 3022 [Urine:520; Other:2500; Stool:2]   Intake/Output this shift:     CVS- RRR RS- CTA ABD- BS present soft non-distended EXT- no edema  AVF  Right    Basic Metabolic Panel:  Recent Labs Lab 09/09/14 0250 09/09/14 1420 09/10/14 0738 09/10/14 2050 09/11/14 0358  NA 150* 133* 136 135 137  K 4.1 4.4 4.8 4.3 4.0  CL 86* 104 101 101 103  CO2 16* 22 27 26 25   GLUCOSE 356* 146* 230* 121* 86  BUN 32* 38* 30* 36* 39*  CREATININE 4.11* 4.69* 4.37* 4.59* 5.13*  CALCIUM 5.4* 9.3 9.0 9.0 9.2  PHOS  --  3.9  --  4.4  --     Liver Function Tests:  Recent Labs Lab 09/08/14 2035 09/09/14 1420 09/10/14 2050  AST 16  --   --   ALT 9*  --   --   ALKPHOS 108  --   --   BILITOT 0.5  --   --   PROT 5.9*  --   --   ALBUMIN 2.9* 2.9* 2.8*   No results for input(s): LIPASE, AMYLASE in the last 168 hours. No results for input(s): AMMONIA in the last 168 hours.  CBC:  Recent Labs Lab 09/08/14 2035 09/09/14 0250 09/09/14 1420 09/10/14 0738 09/10/14 2050 09/11/14 0358  WBC 10.2 9.0 9.0 8.8 9.1 9.6  NEUTROABS 7.6  --  6.7  --   --   --   HGB 10.3* 10.6* 10.1* 10.4* 10.5* 9.6*   HCT 31.3* 33.3* 31.5* 31.8* 31.6* 30.4*  MCV 91.8 93.0 92.9 93.5 92.9 92.7  PLT 168 177 185 163 184 196    Cardiac Enzymes: No results for input(s): CKTOTAL, CKMB, CKMBINDEX, TROPONINI in the last 168 hours.  BNP: Invalid input(s): POCBNP  CBG:  Recent Labs Lab 09/10/14 2116 09/11/14 1222 09/11/14 1701 09/11/14 2032 09/12/14 0723  GLUCAP 97 114* 215* 139* 2    Microbiology: Results for orders placed or performed during the hospital encounter of 09/08/14  Urine culture     Status: None   Collection Time: 09/08/14  8:13 PM  Result Value Ref Range Status   Specimen Description URINE, CLEAN CATCH  Final   Special Requests NONE  Final   Culture MULTIPLE SPECIES PRESENT, SUGGEST RECOLLECTION  Final   Report Status 09/10/2014 FINAL  Final  Culture, blood (routine x 2)     Status: None (Preliminary result)   Collection Time: 09/08/14  8:35 PM  Result Value Ref Range Status   Specimen Description BLOOD LEFT HAND  Final   Special Requests BOTTLES DRAWN  AEROBIC ONLY 1CC  Final   Culture NO GROWTH 3 DAYS  Final   Report Status PENDING  Incomplete  Culture, blood (routine x 2)     Status: None (Preliminary result)   Collection Time: 09/08/14 10:10 PM  Result Value Ref Range Status   Specimen Description BLOOD LEFT HAND  Final   Special Requests   Final    BOTTLES DRAWN AEROBIC AND ANAEROBIC 2CC ANA 3CC AER   Culture NO GROWTH 3 DAYS  Final   Report Status PENDING  Incomplete  MRSA PCR Screening     Status: None   Collection Time: 09/09/14  5:27 AM  Result Value Ref Range Status   MRSA by PCR NEGATIVE NEGATIVE Final    Comment:        The GeneXpert MRSA Assay (FDA approved for NASAL specimens only), is one component of a comprehensive MRSA colonization surveillance program. It is not intended to diagnose MRSA infection nor to guide or monitor treatment for MRSA infections.   Cath Tip Culture     Status: None (Preliminary result)   Collection Time: 09/10/14 11:45 AM   Result Value Ref Range Status   Specimen Description CATH TIP  Final   Special Requests NONE  Final   Culture   Final    Culture reincubated for better growth Performed at [facility]    Report Status PENDING  Incomplete    Coagulation Studies: No results for input(s): LABPROT, INR in the last 72 hours.  Urinalysis: No results for input(s): COLORURINE, LABSPEC, PHURINE, GLUCOSEU, HGBUR, BILIRUBINUR, KETONESUR, PROTEINUR, UROBILINOGEN, NITRITE, LEUKOCYTESUR in the last 72 hours.  Invalid input(s): APPERANCEUR    Imaging: No results found.   Medications:     . amLODipine  10 mg Oral QHS  . aspirin EC  81 mg Oral q morning - 10a  . calcium acetate  667 mg Oral TID WC  . clopidogrel  75 mg Oral Daily  . darbepoetin (ARANESP) injection - DIALYSIS  25 mcg Intravenous Q Wed-HD  . doxazosin  1 mg Oral QHS  . doxercalciferol  5 mcg Intravenous Q M,W,F-HD  . famotidine  20 mg Oral QHS  . heparin  5,000 Units Subcutaneous 3 times per day  . insulin aspart  0-9 Units Subcutaneous TID WC  . insulin detemir  18 Units Subcutaneous BID  . lidocaine  10 mL Intradermal Once  . multivitamin  1 tablet Oral QHS  . piperacillin-tazobactam (ZOSYN)  IV  2.25 g Intravenous 3 times per day  . polyethylene glycol  17 g Oral Daily  . pravastatin  20 mg Oral Daily  . pregabalin  75 mg Oral Daily  . QUEtiapine  25 mg Oral Daily  . senna-docusate  2 tablet Oral BID  . sodium chloride  3 mL Intravenous Q12H  . [START ON 09/13/2014] vancomycin  1,000 mg Intravenous Q M,W,F-HD   acetaminophen **OR** acetaminophen, hydrALAZINE, HYDROcodone-acetaminophen, ondansetron  Assessment/ Plan:   ESRD- Dialysis MWF  ANEMIA- Stable Hb aranesp   MBD- Stable  HTN/VOL - controlled  ACCESS- cimino right  Cultures still pending  Catheter out and Afebrile  Could be discharged on Vancomycin and Fortaz on dialysis three x week for 3 weeks pending cultures    LOS: 4 Dickey Caamano W @TODAY @10 :01  AM

## 2014-09-12 NOTE — Progress Notes (Signed)
[facility]                                                                              Patient Demographics  Aaron Long, is a 74 y.o. male, DOB - [birth year], HUT:[id number]  Admit date - 09/08/2014   Admitting Physician Ivor Costa, MD  Outpatient Primary MD for the patient is [name], NP  LOS - 4   Chief Complaint  Patient presents with  . Fever  . Altered Mental Status      HPI on 09/09/2014 by Dr. Ivor Costa Aaron Long is a 74 y.o. male with PMH of ESRD-HD (MWF), CAD (s/p of CABG), hypertension, hyperlipidemia, diastolic congestive heart failure (EF of 60-65%), GERD, depression, carotid artery stenosis (s/p of right carotid artery endarterectomy), DVT, prostate cancer (s/p of surgery), BPH, bilateral BKA, who presents with fever, chills, confusion. Patient reports that he started having fever and chills since this morning, but no other symptoms, such as cough, runny nose, sore throat, chest pain, abdominal pain, diarrhea, symptoms of UTI, skin infection, ear pain, headache, neck rigidity, photophobia. No tenderness over his dialysis catheter in right upper chest. His wife reports that the patient was mildly confused, which has already resolved in the emergency room. She does not have rashes, unilateral weakness. In ED, patient was found to have temperature 102.6, lactate 2.44, WBC 10.2, tachycardia, negative urinalysis, creatinine 4.73, BUN 37, negative chest x-ray for acute abnormalities. Patient is admitted to inpatient for further eval and treatment.   Assessment & Plan   Sepsis secondary to unknown cause  -Upon admission, patient was febrile with tachycardia -Continues to febrile, Tmax 101F (past 24hrs) -? PermCath- removed on 8/30 and tip sent for culture -Continue vancomycin and Zosyn per pharmacy -Blood cultures show no growth to date -Chest x-ray shows no acute pulmonary findings -UA negative for infection  Acute encephalopathy -Seems  returned to baseline -Likely secondary to sepsis -CT head: No acute intracranial process  End-stage renal disease -Nephrology consulted and appreciated -Patient dialyzes MWF  Constipation -s/p Tap water enema, on miralax/senokot-s   Hypertension -Continue amlodipine and hydralazine as necessary  Diabetes mellitus, type II with neuropathy -Hemoglobin A1c 7.2 -Continue insulin sliding scale CBG monitoring -Continue Lyrica for neuropathy  Diastolic congestive heart failure -Echocardiogram 320 90,016 shows EF 60-65% with grade 1 diastolic dysfunction -Currently appears to be euvolemic and compensated. -Managed by dialysis  Coronary artery disease -Currently no chest pain -Continue aspirin, Plavix, statin  GERD -Continue Pepcid  Anemia of chronic disease -Hemoglobin currently stable, 10.4 -Continue to monitor CBC  Hyperlipidemia -Continue statin -LDL 97 (on 04/02/2014)  History prostate cancer -s/p surgery   Code Status: Full  Family Communication:  Patient   Disposition Plan: home when medically stable, likely in 1-2 days, if fever free, and culture result finalized.  Time Spent in minutes   35 minutes  Procedures  Removal of catheter  Consults   Nephrology  Vascular surgery  DVT Prophylaxis  heparin   Lab Results  Component Value Date   PLT 196 09/11/2014    Medications  Scheduled Meds: . amLODipine  10 mg Oral QHS  . aspirin EC  81 mg Oral q  morning - 10a  . calcium acetate  667 mg Oral TID WC  . clopidogrel  75 mg Oral Daily  . darbepoetin (ARANESP) injection - DIALYSIS  25 mcg Intravenous Q Wed-HD  . doxazosin  1 mg Oral QHS  . doxercalciferol  5 mcg Intravenous Q M,W,F-HD  . famotidine  20 mg Oral QHS  . heparin  5,000 Units Subcutaneous 3 times per day  . insulin aspart  0-9 Units Subcutaneous TID WC  . insulin detemir  18 Units Subcutaneous BID  . lidocaine  10 mL Intradermal Once  . multivitamin  1 tablet Oral QHS  .  piperacillin-tazobactam (ZOSYN)  IV  2.25 g Intravenous 3 times per day  . polyethylene glycol  17 g Oral Daily  . pravastatin  20 mg Oral Daily  . pregabalin  75 mg Oral Daily  . QUEtiapine  25 mg Oral Daily  . senna-docusate  2 tablet Oral BID  . sodium chloride  3 mL Intravenous Q12H  . [START ON 09/13/2014] vancomycin  1,000 mg Intravenous Q M,W,F-HD   Continuous Infusions:  PRN Meds:.acetaminophen **OR** acetaminophen, hydrALAZINE, HYDROcodone-acetaminophen, ondansetron  Antibiotics    Anti-infectives    Start     Dose/Rate Route Frequency Ordered Stop   09/13/14 1200  vancomycin (VANCOCIN) IVPB 1000 mg/200 mL premix     1,000 mg 200 mL/hr over 60 Minutes Intravenous Every M-W-F (Hemodialysis) 09/11/14 0901     09/11/14 2200  piperacillin-tazobactam (ZOSYN) IVPB 2.25 g     2.25 g 100 mL/hr over 30 Minutes Intravenous 3 times per day 09/11/14 1841     09/11/14 1200  vancomycin (VANCOCIN) IVPB 1000 mg/200 mL premix  Status:  Discontinued     1,000 mg 200 mL/hr over 60 Minutes Intravenous Every M-W-F (Hemodialysis) 09/09/14 1215 09/11/14 0901   09/11/14 1100  vancomycin (VANCOCIN) 1,250 mg in sodium chloride 0.9 % 250 mL IVPB     1,250 mg 250 mL/hr over 60 Minutes Intravenous  Once 09/11/14 0901 09/11/14 1146   09/09/14 1800  vancomycin (VANCOCIN) IVPB 1000 mg/200 mL premix  Status:  Discontinued     1,000 mg 200 mL/hr over 60 Minutes Intravenous Every M-W-F (Hemodialysis) 09/09/14 1203 09/09/14 1215   09/09/14 1600  vancomycin (VANCOCIN) 1,750 mg in sodium chloride 0.9 % 500 mL IVPB     1,750 mg 250 mL/hr over 120 Minutes Intravenous  Once 09/09/14 1215 09/09/14 1854   09/09/14 1300  vancomycin (VANCOCIN) IVPB 750 mg/150 ml premix  Status:  Discontinued     750 mg 150 mL/hr over 60 Minutes Intravenous  Once 09/09/14 1203 09/09/14 1215   09/09/14 0600  piperacillin-tazobactam (ZOSYN) IVPB 2.25 g  Status:  Discontinued     2.25 g 100 mL/hr over 30 Minutes Intravenous 3 times per  day 09/08/14 2353 09/11/14 1749   09/08/14 2345  piperacillin-tazobactam (ZOSYN) IVPB 3.375 g  Status:  Discontinued     3.375 g 100 mL/hr over 30 Minutes Intravenous 3 times per day 09/08/14 2343 09/08/14 2352   09/08/14 2230  piperacillin-tazobactam (ZOSYN) IVPB 3.375 g     3.375 g 100 mL/hr over 30 Minutes Intravenous  Once 09/08/14 2220 09/08/14 2301   09/08/14 2230  vancomycin (VANCOCIN) IVPB 1000 mg/200 mL premix  Status:  Discontinued     1,000 mg 200 mL/hr over 60 Minutes Intravenous Every 48 hours 09/08/14 2227 09/09/14 1155        Subjective:   Aaron Long seen and examined today.  Reported feeling better,  denies pain, had bm. No fever.   Objective:   Filed Vitals:   09/11/14 1749 09/11/14 2035 09/12/14 0510 09/12/14 0950  BP: 129/61 116/72 133/53 109/50  Pulse: 86 87 84 86  Temp: 98.2 F (36.8 C) 98.4 F (36.9 C) 98.5 F (36.9 C) 98.3 F (36.8 C)  TempSrc: Oral   Oral  Resp: 17 18 18 18   Height:      Weight:  176 lb 5.9 oz (80 kg)    SpO2: 100% 100% 100% 98%    Wt Readings from Last 3 Encounters:  09/11/14 176 lb 5.9 oz (80 kg)  06/19/14 205 lb (92.987 kg)  06/11/14 205 lb (92.987 kg)     Intake/Output Summary (Last 24 hours) at 09/12/14 1831 Last data filed at 09/12/14 0600  Gross per 24 hour  Intake    220 ml  Output    200 ml  Net     20 ml    Exam  General: Well developed, well nourished, No distress  HEENT: NCAT,mucous membranes moist.   Cardiovascular: S1 S2 auscultated, soft SEM,  RRR  Respiratory: Clear to auscultation bilaterally with equal chest rise  Abdomen: Soft, nontender, nondistended, + bowel sounds  Extremities: B/L BKA, stumps wrapped.  Right forearm AVF +B/T.    Neuro: AAOx3, nonfocal, slow to respond to questions  Data Review   Micro Results Recent Results (from the past 240 hour(s))  Urine culture     Status: None   Collection Time: 09/08/14  8:13 PM  Result Value Ref Range Status   Specimen Description  URINE, CLEAN CATCH  Final   Special Requests NONE  Final   Culture MULTIPLE SPECIES PRESENT, SUGGEST RECOLLECTION  Final   Report Status 09/10/2014 FINAL  Final  Culture, blood (routine x 2)     Status: None (Preliminary result)   Collection Time: 09/08/14  8:35 PM  Result Value Ref Range Status   Specimen Description BLOOD LEFT HAND  Final   Special Requests BOTTLES DRAWN AEROBIC ONLY 1CC  Final   Culture NO GROWTH 4 DAYS  Final   Report Status PENDING  Incomplete  Culture, blood (routine x 2)     Status: None (Preliminary result)   Collection Time: 09/08/14 10:10 PM  Result Value Ref Range Status   Specimen Description BLOOD LEFT HAND  Final   Special Requests   Final    BOTTLES DRAWN AEROBIC AND ANAEROBIC 2CC ANA 3CC AER   Culture NO GROWTH 4 DAYS  Final   Report Status PENDING  Incomplete  MRSA PCR Screening     Status: None   Collection Time: 09/09/14  5:27 AM  Result Value Ref Range Status   MRSA by PCR NEGATIVE NEGATIVE Final    Comment:        The GeneXpert MRSA Assay (FDA approved for NASAL specimens only), is one component of a comprehensive MRSA colonization surveillance program. It is not intended to diagnose MRSA infection nor to guide or monitor treatment for MRSA infections.   Cath Tip Culture     Status: None (Preliminary result)   Collection Time: 09/10/14 11:45 AM  Result Value Ref Range Status   Specimen Description CATH TIP  Final   Special Requests NONE  Final   Culture   Final    >100 COLONIES GRAM NEGATIVE RODS Performed at [facility]    Report Status PENDING  Incomplete    Radiology Reports Dg Chest 2 View  09/08/2014   CLINICAL DATA:  Fever of 2-3 days duration.  EXAM: CHEST  2 VIEW  COMPARISON:  04/19/2014  FINDINGS: There is a right internal jugular central line extending into the SVC. There is mild unchanged left hemidiaphragm elevation and mild unchanged linear left base scarring. The lungs are otherwise clear. The pulmonary  vasculature is normal. There is no pleural effusion. There is prior sternotomy and CABG.  IMPRESSION: Mild linear left base scarring.  No acute cardiopulmonary findings.   Electronically Signed   By: Andreas Newport M.D.   On: 09/08/2014 21:08   Ct Head Wo Contrast  09/08/2014   CLINICAL DATA:  Initial evaluation for acute altered mental status.  EXAM: CT HEAD WITHOUT CONTRAST  TECHNIQUE: Contiguous axial images were obtained from the base of the skull through the vertex without intravenous contrast.  COMPARISON:  Prior study from 04/17/2014.  FINDINGS: Atrophy with chronic microvascular ischemic disease present. Vascular calcifications present within the carotid siphons and distal vertebral arteries.  No acute large vessel territory infarct. No mass lesion, midline shift, or mass effect. Asymmetric hypodensity adjacent to the atrium of the left lateral ventricle a is grossly similar to previous. No associated edema. This is of doubtful clinical significance. No hydrocephalus. No extra-axial fluid collection.  Scalp soft tissues within normal limits. No acute abnormality about the orbits.  Paranasal sinuses and mastoid air cells are clear. Calvarium intact.  IMPRESSION: 1. No acute intracranial process. 2. Generalized cerebral atrophy with chronic microvascular ischemic disease, stable.   Electronically Signed   By: Jeannine Boga M.D.   On: 09/08/2014 23:19    CBC  Recent Labs Lab 09/08/14 2035 09/09/14 0250 09/09/14 1420 09/10/14 0738 09/10/14 2050 09/11/14 0358  WBC 10.2 9.0 9.0 8.8 9.1 9.6  HGB 10.3* 10.6* 10.1* 10.4* 10.5* 9.6*  HCT 31.3* 33.3* 31.5* 31.8* 31.6* 30.4*  PLT 168 177 185 163 184 196  MCV 91.8 93.0 92.9 93.5 92.9 92.7  MCH 30.2 29.6 29.8 30.6 30.9 29.3  MCHC 32.9 31.8 32.1 32.7 33.2 31.6  RDW 15.4 15.5 15.5 15.7* 15.6* 15.8*  LYMPHSABS 1.8  --  1.6  --   --   --   MONOABS 0.6  --  0.5  --   --   --   EOSABS 0.1  --  0.1  --   --   --   BASOSABS 0.0  --  0.0  --    --   --     Chemistries   Recent Labs Lab 09/08/14 2035 09/09/14 0250 09/09/14 1420 09/10/14 0738 09/10/14 2050 09/11/14 0358  NA 130* 150* 133* 136 135 137  K 4.7 4.1 4.4 4.8 4.3 4.0  CL 98* 86* 104 101 101 103  CO2 20* 16* 22 27 26 25   GLUCOSE 457* 356* 146* 230* 121* 86  BUN 37* 32* 38* 30* 36* 39*  CREATININE 4.73* 4.11* 4.69* 4.37* 4.59* 5.13*  CALCIUM 8.4* 5.4* 9.3 9.0 9.0 9.2  AST 16  --   --   --   --   --   ALT 9*  --   --   --   --   --   ALKPHOS 108  --   --   --   --   --   BILITOT 0.5  --   --   --   --   --    ------------------------------------------------------------------------------------------------------------------ estimated creatinine clearance is 12.2 mL/min (by C-G formula based on Cr of 5.13). ------------------------------------------------------------------------------------------------------------------ No results for input(s): HGBA1C in the  last 72 hours. ------------------------------------------------------------------------------------------------------------------ No results for input(s): CHOL, HDL, LDLCALC, TRIG, CHOLHDL, LDLDIRECT in the last 72 hours. ------------------------------------------------------------------------------------------------------------------ No results for input(s): TSH, T4TOTAL, T3FREE, THYROIDAB in the last 72 hours.  Invalid input(s): FREET3 ------------------------------------------------------------------------------------------------------------------ No results for input(s): VITAMINB12, FOLATE, FERRITIN, TIBC, IRON, RETICCTPCT in the last 72 hours.  Coagulation profile  Recent Labs Lab 09/09/14 0037  INR 1.05    No results for input(s): DDIMER in the last 72 hours.  Cardiac Enzymes No results for input(s): CKMB, TROPONINI, MYOGLOBIN in the last 168 hours.  Invalid input(s): CK ------------------------------------------------------------------------------------------------------------------ Invalid  input(s): POCBNP    Terease Marcotte MD PhD on 09/12/2014 at 6:31 PM  Between 7am to 7pm - Pager - [phone]  After 7pm go to [email or web address] - password [email or web address]  And look for the night coverage person covering for me after hours  [facility] Office  [phone]

## 2014-09-12 NOTE — Care Management Note (Signed)
Case Management Note  Patient Details  Name: Aaron Long MRN: [id number] Date of Birth: 1940/03/26  Subjective/Objective:        CM following for progression and d/c planning.            Action/Plan: 09/12/14 Met with pt who states that he plans to return to home, where he has assistance from his wife and nephew. Pt uses prostesis to travel to outpatient hemodialysis and would like to have HHPT only after he has been home and recovered from going to HD. He has HD on Mon, Wed and Friday. We will ask [facility] to do a PT eval in the home on Sept 8th, 2016 after he has been home for a few days and traveled to HD a few times. [name] would be his best day per pt. I have spoke with [facility] about this as pt states that he needs time to recover from HD treatments. Pt has no DME needs.   Expected Discharge Date:  09/12/2014           Expected Discharge Plan:  [place]  In-House Referral:  Clinical Social Work  Discharge planning Services  CM Consult  Post Acute Care Choice:  NA Choice offered to:  NA  DME Arranged:    DME Agency:     HH Arranged:  PT [place]:  [place]  Status of Service:  Completed, signed off  Medicare Important Message Given:  Yes-second notification given Date Medicare IM Given:    Medicare IM give by:    Date Additional Medicare IM Given:    Additional Medicare Important Message give by:     If discussed at [place] of Stay Meetings, dates discussed:    Additional Comments:  [name], RN 09/12/2014, 12:33 PM

## 2014-09-12 NOTE — Progress Notes (Signed)
Occupational Therapy Treatment Patient Details Name: Aaron Long MRN: [id number] DOB: [birth year] Today's Date: 09/12/2014    History of present illness 74 yo admitted with confusion and fever PMH: bil BKA with prothesis HTN, CAD s/p CABG, CHF, GERD, Depression, prostate CA, PAD   OT comments  Pt with marked improvement in sitting balance, cognition and general strength.  Able to don prostheses at EOB and transfer with min guard assist of one to chair.  Pt grateful to be OOB. Pt with no recollection of functional deficits from previous visit.  Follow Up Recommendations  No OT follow up    Equipment Recommendations  None recommended by OT    Recommendations for Other Services      Precautions / Restrictions Precautions Precautions: Fall Precaution Comments: Bil BKA Required Braces or Orthoses: Other Brace/Splint Other Brace/Splint: used B prostheses       Mobility Bed Mobility Overal bed mobility: Needs Assistance Bed Mobility: Rolling;Sidelying to Sit Rolling: Supervision Sidelying to sit: Supervision       General bed mobility comments: HOB flat, reliant on bed rail  Transfers                      Balance     Sitting balance-Leahy Scale: Good Sitting balance - Comments: No difficulty maintaining balance at EOB while donning prostheses                           ADL Overall ADL's : Needs assistance/impaired     Grooming: Wash/dry face;Oral care;Set up;Sitting           Upper Body Dressing : Set up;Sitting   Lower Body Dressing: Set up;Sitting/lateral leans Lower Body Dressing Details (indicate cue type and reason): donned prostheses at EOB without physical assist Toilet Transfer: Min guard (lateral transfer) Toilet Transfer Details (indicate cue type and reason): simulated to drop arm recliner Toileting- Clothing Manipulation and Hygiene: Total assistance;Sitting/lateral lean                Vision                      Perception     Praxis      Cognition   Behavior During Therapy: WFL for tasks assessed/performed;Flat affect Overall Cognitive Status: Within Functional Limits for tasks assessed                       Extremity/Trunk Assessment               Exercises     Shoulder Instructions       General Comments      Pertinent Vitals/ Pain       Pain Assessment: No/denies pain  Home Living                                          Prior Functioning/Environment              Frequency Min 2X/week     Progress Toward Goals  OT Goals(current goals can now be found in the care plan section)  Progress towards OT goals: Progressing toward goals  Acute Rehab OT Goals Patient Stated Goal: to get stronger Time For Goal Achievement: 09/23/14 Potential to Achieve Goals: Good  Plan Discharge plan needs to be updated  Co-evaluation    PT/OT/SLP Co-Evaluation/Treatment: Yes Reason for Co-Treatment: For patient/therapist safety   OT goals addressed during session: ADL's and self-care      End of Session     Activity Tolerance Patient tolerated treatment well   Patient Left in chair;with call bell/phone within reach;with family/visitor present   Nurse Communication  (MD text sent for order of new shrinker, RN made aware)        Time: 0902-0932 OT Time Calculation (min): 30 min  Charges: OT General Charges $OT Visit: 1 Procedure OT Treatments $Self Care/Home Management : 8-22 mins  [name] 09/12/2014, 9:51 AM  [phone]

## 2014-09-12 NOTE — Progress Notes (Signed)
Physical Therapy Treatment Patient Details Name: Aaron Long MRN: [id number] DOB: Dec 21, 1940 Today's Date: 09/12/2014    History of Present Illness 74 yo admitted with confusion and fever PMH: bil BKA with prothesis HTN, CAD s/p CABG, CHF, GERD, Depression, prostate CA, PAD    PT Comments    Pt making good progress towards all goals and feel that he is close to baseline.  Have updated recommendations to HHPT and also discussed getting new order for OP PT if wanting to increase amount of ambulation in home.  Pt and daughter verbalized understanding.    Follow Up Recommendations  Home health PT;Supervision for mobility/OOB     Equipment Recommendations  None recommended by PT    Recommendations for Other Services       Precautions / Restrictions Precautions Precautions: Fall Precaution Comments: Bil BKA Required Braces or Orthoses: Other Brace/Splint Other Brace/Splint: used B prostheses    Mobility  Bed Mobility Overal bed mobility: Needs Assistance Bed Mobility: Rolling;Sidelying to Sit Rolling: Supervision Sidelying to sit: Supervision       General bed mobility comments: HOB flat, reliant on bed rail, states hospital bed more difficult than home bed.   Transfers Overall transfer level: Needs assistance Equipment used: None Transfers: Lateral/Scoot Transfers          Lateral/Scoot Transfers: Min assist General transfer comment: Pt attempted transfer to the L, however due to inability to remove arm rest, readjusted recliner and lowered arm rest with pt performing lateral scoot transfer at min A level with cues for increased forward weight shift and hand placement.    Ambulation/Gait Ambulation/Gait assistance:  (pt non ambulatory)               Stairs            Wheelchair Mobility    Modified Rankin (Stroke Patients Only)       Balance Overall balance assessment: Modified Independent   Sitting balance-Leahy Scale: Good Sitting  balance - Comments: No difficulty maintaining balance at EOB while donning prostheses                            Cognition Arousal/Alertness: Awake/alert Behavior During Therapy: WFL for tasks assessed/performed;Flat affect Overall Cognitive Status: Within Functional Limits for tasks assessed                      Exercises      General Comments General comments (skin integrity, edema, etc.): Pt able to don B prosthesis on his own during session.  Education to pt on leaving at least liner and socks on L residual limb to prevent increased swelling since lost L shrinker during hospital stay.        Pertinent Vitals/Pain Pain Assessment: No/denies pain Faces Pain Scale: No hurt    Home Living                      Prior Function            PT Goals (current goals can now be found in the care plan section) Acute Rehab PT Goals Patient Stated Goal: to get stronger PT Goal Formulation: With patient/family Time For Goal Achievement: 09/23/14 Potential to Achieve Goals: Good Progress towards PT goals: Progressing toward goals    Frequency  Min 3X/week    PT Plan Discharge plan needs to be updated    Co-evaluation PT/OT/SLP Co-Evaluation/Treatment: Yes Reason for Co-Treatment: For  patient/therapist safety PT goals addressed during session: Mobility/safety with mobility;Balance OT goals addressed during session: ADL's and self-care     End of Session Equipment Utilized During Treatment: Other (comment) (B prosthesis) Activity Tolerance: Patient limited by fatigue Patient left: in bed;with call bell/phone within reach;with bed alarm set;with family/visitor present     Time: 0902-0930 PT Time Calculation (min) (ACUTE ONLY): 28 min  Charges:  $Therapeutic Activity: 8-22 mins                    G Codes:      Denice Bors 09/12/2014, 10:57 AM

## 2014-09-13 DIAGNOSIS — E1140 Type 2 diabetes mellitus with diabetic neuropathy, unspecified: Secondary | ICD-10-CM

## 2014-09-13 LAB — GLUCOSE, CAPILLARY
GLUCOSE-CAPILLARY: 215 mg/dL — AB (ref 65–99)
GLUCOSE-CAPILLARY: 218 mg/dL — AB (ref 65–99)
Glucose-Capillary: 110 mg/dL — ABNORMAL HIGH (ref 65–99)
Glucose-Capillary: 116 mg/dL — ABNORMAL HIGH (ref 65–99)

## 2014-09-13 LAB — CBC
HEMATOCRIT: 30.8 % — AB (ref 39.0–52.0)
Hemoglobin: 10 g/dL — ABNORMAL LOW (ref 13.0–17.0)
MCH: 30 pg (ref 26.0–34.0)
MCHC: 32.5 g/dL (ref 30.0–36.0)
MCV: 92.5 fL (ref 78.0–100.0)
PLATELETS: 252 10*3/uL (ref 150–400)
RBC: 3.33 MIL/uL — AB (ref 4.22–5.81)
RDW: 15.8 % — AB (ref 11.5–15.5)
WBC: 7.9 10*3/uL (ref 4.0–10.5)

## 2014-09-13 LAB — CULTURE, BLOOD (ROUTINE X 2)
CULTURE: NO GROWTH
Culture: NO GROWTH

## 2014-09-13 LAB — RENAL FUNCTION PANEL
ALBUMIN: 2.7 g/dL — AB (ref 3.5–5.0)
Anion gap: 10 (ref 5–15)
BUN: 31 mg/dL — AB (ref 6–20)
CHLORIDE: 99 mmol/L — AB (ref 101–111)
CO2: 26 mmol/L (ref 22–32)
Calcium: 9 mg/dL (ref 8.9–10.3)
Creatinine, Ser: 5.64 mg/dL — ABNORMAL HIGH (ref 0.61–1.24)
GFR, EST AFRICAN AMERICAN: 10 mL/min — AB (ref >60)
GFR, EST NON AFRICAN AMERICAN: 9 mL/min — AB (ref >60)
Glucose, Bld: 144 mg/dL — ABNORMAL HIGH (ref 65–99)
PHOSPHORUS: 5.1 mg/dL — AB (ref 2.5–4.6)
POTASSIUM: 4.6 mmol/L (ref 3.5–5.1)
Sodium: 135 mmol/L (ref 135–145)

## 2014-09-13 LAB — CATH TIP CULTURE

## 2014-09-13 MED ORDER — PENTAFLUOROPROP-TETRAFLUOROETH EX AERO: 1.0000 "application " | INHALATION_SPRAY | CUTANEOUS | Status: DC | PRN

## 2014-09-13 MED ORDER — LIDOCAINE HCL (PF) 1 % IJ SOLN: 5.0000 mL | INTRAMUSCULAR | Status: DC | PRN

## 2014-09-13 MED ORDER — LIDOCAINE-PRILOCAINE 2.5-2.5 % EX CREA: 1.0000 "application " | TOPICAL_CREAM | CUTANEOUS | Status: DC | PRN

## 2014-09-13 MED ORDER — SODIUM CHLORIDE 0.9 % IV SOLN: 100.0000 mL | INTRAVENOUS | Status: DC | PRN

## 2014-09-13 MED ORDER — HEPARIN SODIUM (PORCINE) 1000 UNIT/ML DIALYSIS: 1000.0000 [IU] | INTRAMUSCULAR | Status: DC | PRN

## 2014-09-13 MED ORDER — DEXTROSE 5 % IV SOLN
2.0000 g | INTRAVENOUS | Status: DC
  Administered 2014-09-13: 2 g via INTRAVENOUS
  Filled 2014-09-13: qty 2

## 2014-09-13 MED ORDER — DEXTROSE 5 % IV SOLN: 2.0000 g | INTRAVENOUS | Status: DC

## 2014-09-13 MED ORDER — ALTEPLASE 2 MG IJ SOLR
2.0000 mg | Freq: Once | INTRAMUSCULAR | Status: DC | PRN
  Filled 2014-09-13: qty 2

## 2014-09-13 MED ORDER — DOXERCALCIFEROL 4 MCG/2ML IV SOLN
INTRAVENOUS | Status: AC
Start: 2014-09-13 — End: 2014-09-14
  Filled 2014-09-13: qty 4

## 2014-09-13 NOTE — Progress Notes (Signed)
[facility]                                                                              Patient Demographics  Aaron Long, is a 74 y.o. male, DOB - 1940/11/24, ERX:[id number]  Admit date - 09/08/2014   Admitting Physician Ivor Costa, MD  Outpatient Primary MD for the patient is [name], NP  LOS - 5   Chief Complaint  Patient presents with  . Fever  . Altered Mental Status      HPI on 09/09/2014 by Dr. Ivor Costa KALIQ LEGE is a 74 y.o. male with PMH of ESRD-HD (MWF), CAD (s/p of CABG), hypertension, hyperlipidemia, diastolic congestive heart failure (EF of 60-65%), GERD, depression, carotid artery stenosis (s/p of right carotid artery endarterectomy), DVT, prostate cancer (s/p of surgery), BPH, bilateral BKA, who presents with fever, chills, confusion. Patient reports that he started having fever and chills since this morning, but no other symptoms, such as cough, runny nose, sore throat, chest pain, abdominal pain, diarrhea, symptoms of UTI, skin infection, ear pain, headache, neck rigidity, photophobia. No tenderness over his dialysis catheter in right upper chest. His wife reports that the patient was mildly confused, which has already resolved in the emergency room. She does not have rashes, unilateral weakness. In ED, patient was found to have temperature 102.6, lactate 2.44, WBC 10.2, tachycardia, negative urinalysis, creatinine 4.73, BUN 37, negative chest x-ray for acute abnormalities. Patient is admitted to inpatient for further eval and treatment.   Assessment & Plan   Sepsis secondary to unknown cause  -Upon admission, patient was febrile with tachycardia and metabolic encephalophathy -Blood cultures show no growth to date -Chest x-ray shows no acute pulmonary findings -UA negative for infection -PermCath- removed on 8/30 and tip sent for culture + g- pantoea, resistant to cefazolin but sensitive to the rest of abx tested. -received iv vancomycin  and Zosyn per pharmacy since admission, abx changed to Elaine from 9/2 after permcath tip culture result. -patient is to get fortaz during dialysis for three weeks.  Acute encephalopathy -Seems returned to baseline -Likely secondary to sepsis -CT head: No acute intracranial process  End-stage renal disease -Nephrology consulted and appreciated -Patient dialyzes MWF  Constipation -s/p Tap water enema, on miralax/senokot-s   Hypertension -Continue amlodipine and hydralazine as necessary  Diabetes mellitus, type II with neuropathy -Hemoglobin A1c 7.2 -Continue insulin sliding scale CBG monitoring -Continue Lyrica for neuropathy  Diastolic congestive heart failure -Echocardiogram 320 90,016 shows EF 60-65% with grade 1 diastolic dysfunction -Currently appears to be euvolemic and compensated. -Managed by dialysis  Coronary artery disease -Currently no chest pain -Continue aspirin, Plavix, statin  GERD -Continue Pepcid  Anemia of chronic disease -Hemoglobin currently stable, 10.4 -Continue to monitor CBC  Hyperlipidemia -Continue statin -LDL 97 (on 04/02/2014)  History prostate cancer -s/p surgery   Code Status: Full  Family Communication:  Patient   Disposition Plan: home on 9/3 with home health  Time Spent in minutes   35 minutes  Procedures  Removal of catheter  Consults   Nephrology  Vascular surgery  DVT Prophylaxis  heparin   Lab Results  Component Value Date   PLT 252  09/13/2014    Medications  Scheduled Meds: . amLODipine  10 mg Oral QHS  . aspirin EC  81 mg Oral q morning - 10a  . calcium acetate  667 mg Oral TID WC  . cefTAZidime (FORTAZ)  IV  2 g Intravenous Q M,W,F-2000  . clopidogrel  75 mg Oral Daily  . darbepoetin (ARANESP) injection - DIALYSIS  25 mcg Intravenous Q Wed-HD  . doxazosin  1 mg Oral QHS  . doxercalciferol      . doxercalciferol  5 mcg Intravenous Q M,W,F-HD  . famotidine  20 mg Oral QHS  . heparin  5,000 Units  Subcutaneous 3 times per day  . insulin aspart  0-9 Units Subcutaneous TID WC  . insulin detemir  18 Units Subcutaneous BID  . lidocaine  10 mL Intradermal Once  . multivitamin  1 tablet Oral QHS  . polyethylene glycol  17 g Oral Daily  . pravastatin  20 mg Oral Daily  . pregabalin  75 mg Oral Daily  . QUEtiapine  25 mg Oral Daily  . senna-docusate  2 tablet Oral BID  . sodium chloride  3 mL Intravenous Q12H   Continuous Infusions:  PRN Meds:.acetaminophen **OR** acetaminophen, hydrALAZINE, HYDROcodone-acetaminophen, ondansetron  Antibiotics    Anti-infectives    Start     Dose/Rate Route Frequency Ordered Stop   09/13/14 2000  cefTAZidime (FORTAZ) 2 g in dextrose 5 % 50 mL IVPB     2 g 100 mL/hr over 30 Minutes Intravenous Every M-W-F (2000) 09/13/14 1159     09/13/14 1200  vancomycin (VANCOCIN) IVPB 1000 mg/200 mL premix  Status:  Discontinued     1,000 mg 200 mL/hr over 60 Minutes Intravenous Every M-W-F (Hemodialysis) 09/11/14 0901 09/13/14 1159   09/11/14 2200  piperacillin-tazobactam (ZOSYN) IVPB 2.25 g  Status:  Discontinued     2.25 g 100 mL/hr over 30 Minutes Intravenous 3 times per day 09/11/14 1841 09/13/14 1159   09/11/14 1200  vancomycin (VANCOCIN) IVPB 1000 mg/200 mL premix  Status:  Discontinued     1,000 mg 200 mL/hr over 60 Minutes Intravenous Every M-W-F (Hemodialysis) 09/09/14 1215 09/11/14 0901   09/11/14 1100  vancomycin (VANCOCIN) 1,250 mg in sodium chloride 0.9 % 250 mL IVPB     1,250 mg 250 mL/hr over 60 Minutes Intravenous  Once 09/11/14 0901 09/11/14 1146   09/09/14 1800  vancomycin (VANCOCIN) IVPB 1000 mg/200 mL premix  Status:  Discontinued     1,000 mg 200 mL/hr over 60 Minutes Intravenous Every M-W-F (Hemodialysis) 09/09/14 1203 09/09/14 1215   09/09/14 1600  vancomycin (VANCOCIN) 1,750 mg in sodium chloride 0.9 % 500 mL IVPB     1,750 mg 250 mL/hr over 120 Minutes Intravenous  Once 09/09/14 1215 09/09/14 1854   09/09/14 1300  vancomycin  (VANCOCIN) IVPB 750 mg/150 ml premix  Status:  Discontinued     750 mg 150 mL/hr over 60 Minutes Intravenous  Once 09/09/14 1203 09/09/14 1215   09/09/14 0600  piperacillin-tazobactam (ZOSYN) IVPB 2.25 g  Status:  Discontinued     2.25 g 100 mL/hr over 30 Minutes Intravenous 3 times per day 09/08/14 2353 09/11/14 1749   09/08/14 2345  piperacillin-tazobactam (ZOSYN) IVPB 3.375 g  Status:  Discontinued     3.375 g 100 mL/hr over 30 Minutes Intravenous 3 times per day 09/08/14 2343 09/08/14 2352   09/08/14 2230  piperacillin-tazobactam (ZOSYN) IVPB 3.375 g     3.375 g 100 mL/hr over 30 Minutes Intravenous  Once 09/08/14 2220 09/08/14 2301   09/08/14 2230  vancomycin (VANCOCIN) IVPB 1000 mg/200 mL premix  Status:  Discontinued     1,000 mg 200 mL/hr over 60 Minutes Intravenous Every 48 hours 09/08/14 2227 09/09/14 1155        Subjective:   Kipp Laurence seen and examined today.  Reported feeling better, denies pain, had bm. No fever.   Objective:   Filed Vitals:   09/13/14 1200 09/13/14 1230 09/13/14 1255 09/13/14 1259  BP: 143/73 114/64 92/59 92/59   Pulse: 76 81 82 82  Temp:    99.7 F (37.6 C)  TempSrc:    Oral  Resp:    18  Height:      Weight:      SpO2:        Wt Readings from Last 3 Encounters:  09/13/14 185 lb 10 oz (84.2 kg)  06/19/14 205 lb (92.987 kg)  06/11/14 205 lb (92.987 kg)     Intake/Output Summary (Last 24 hours) at 09/13/14 1748 Last data filed at 09/13/14 1259  Gross per 24 hour  Intake    220 ml  Output   2149 ml  Net  -1929 ml    Exam  General: Well developed, well nourished, No distress  HEENT: NCAT,mucous membranes moist.   Cardiovascular: S1 S2 auscultated, soft SEM,  RRR  Respiratory: Clear to auscultation bilaterally with equal chest rise  Abdomen: Soft, nontender, nondistended, + bowel sounds  Extremities: B/L BKA, stumps wrapped.  Right forearm AVF +B/T.    Neuro: AAOx3, nonfocal, slow to respond to questions  Data  Review   Micro Results Recent Results (from the past 240 hour(s))  Urine culture     Status: None   Collection Time: 09/08/14  8:13 PM  Result Value Ref Range Status   Specimen Description URINE, CLEAN CATCH  Final   Special Requests NONE  Final   Culture MULTIPLE SPECIES PRESENT, SUGGEST RECOLLECTION  Final   Report Status 09/10/2014 FINAL  Final  Culture, blood (routine x 2)     Status: None   Collection Time: 09/08/14  8:35 PM  Result Value Ref Range Status   Specimen Description BLOOD LEFT HAND  Final   Special Requests BOTTLES DRAWN AEROBIC ONLY [place]  Final   Culture NO GROWTH 5 DAYS  Final   Report Status 09/13/2014 FINAL  Final  Culture, blood (routine x 2)     Status: None   Collection Time: 09/08/14 10:10 PM  Result Value Ref Range Status   Specimen Description BLOOD LEFT HAND  Final   Special Requests   Final    BOTTLES DRAWN AEROBIC AND ANAEROBIC 2CC ANA 3CC AER   Culture NO GROWTH 5 DAYS  Final   Report Status 09/13/2014 FINAL  Final  MRSA PCR Screening     Status: None   Collection Time: 09/09/14  5:27 AM  Result Value Ref Range Status   MRSA by PCR NEGATIVE NEGATIVE Final    Comment:        The GeneXpert MRSA Assay (FDA approved for NASAL specimens only), is one component of a comprehensive MRSA colonization surveillance program. It is not intended to diagnose MRSA infection nor to guide or monitor treatment for MRSA infections.   Cath Tip Culture     Status: None   Collection Time: 09/10/14 11:45 AM  Result Value Ref Range Status   Specimen Description CATH TIP  Final   Special Requests NONE  Final   Culture  Final    >100 COLONIES PANTOEA SPECIES Performed at [facility]    Report Status 09/13/2014 FINAL  Final   Organism ID, Bacteria PANTOEA SPECIES  Final      Susceptibility   Pantoea species - MIC*    CEFAZOLIN RESISTANT      CEFEPIME <=1 SENSITIVE Sensitive     CEFTAZIDIME <=1 SENSITIVE Sensitive     CEFTRIAXONE <=1 SENSITIVE  Sensitive     CIPROFLOXACIN <=0.25 SENSITIVE Sensitive     GENTAMICIN <=1 SENSITIVE Sensitive     IMIPENEM <=0.25 SENSITIVE Sensitive     PIP/TAZO <=4 SENSITIVE Sensitive     TOBRAMYCIN <=1 SENSITIVE Sensitive     TRIMETH/SULFA <=20 SENSITIVE Sensitive     * >100 COLONIES PANTOEA SPECIES    Radiology Reports Dg Chest 2 View  09/08/2014   CLINICAL DATA:  Fever of 2-3 days duration.  EXAM: CHEST  2 VIEW  COMPARISON:  04/19/2014  FINDINGS: There is a right internal jugular central line extending into the SVC. There is mild unchanged left hemidiaphragm elevation and mild unchanged linear left base scarring. The lungs are otherwise clear. The pulmonary vasculature is normal. There is no pleural effusion. There is prior sternotomy and CABG.  IMPRESSION: Mild linear left base scarring.  No acute cardiopulmonary findings.   Electronically Signed   By: Andreas Newport M.D.   On: 09/08/2014 21:08   Ct Head Wo Contrast  09/08/2014   CLINICAL DATA:  Initial evaluation for acute altered mental status.  EXAM: CT HEAD WITHOUT CONTRAST  TECHNIQUE: Contiguous axial images were obtained from the base of the skull through the vertex without intravenous contrast.  COMPARISON:  Prior study from 04/17/2014.  FINDINGS: Atrophy with chronic microvascular ischemic disease present. Vascular calcifications present within the carotid siphons and distal vertebral arteries.  No acute large vessel territory infarct. No mass lesion, midline shift, or mass effect. Asymmetric hypodensity adjacent to the atrium of the left lateral ventricle a is grossly similar to previous. No associated edema. This is of doubtful clinical significance. No hydrocephalus. No extra-axial fluid collection.  Scalp soft tissues within normal limits. No acute abnormality about the orbits.  Paranasal sinuses and mastoid air cells are clear. Calvarium intact.  IMPRESSION: 1. No acute intracranial process. 2. Generalized cerebral atrophy with chronic  microvascular ischemic disease, stable.   Electronically Signed   By: Jeannine Boga M.D.   On: 09/08/2014 23:19    CBC  Recent Labs Lab 09/08/14 2035  09/09/14 1420 09/10/14 0738 09/10/14 2050 09/11/14 0358 09/13/14 0915  WBC 10.2  < > 9.0 8.8 9.1 9.6 7.9  HGB 10.3*  < > 10.1* 10.4* 10.5* 9.6* 10.0*  HCT 31.3*  < > 31.5* 31.8* 31.6* 30.4* 30.8*  PLT 168  < > 185 163 184 196 252  MCV 91.8  < > 92.9 93.5 92.9 92.7 92.5  MCH 30.2  < > 29.8 30.6 30.9 29.3 30.0  MCHC 32.9  < > 32.1 32.7 33.2 31.6 32.5  RDW 15.4  < > 15.5 15.7* 15.6* 15.8* 15.8*  LYMPHSABS 1.8  --  1.6  --   --   --   --   MONOABS 0.6  --  0.5  --   --   --   --   EOSABS 0.1  --  0.1  --   --   --   --   BASOSABS 0.0  --  0.0  --   --   --   --   < > =  values in this interval not displayed.  Chemistries   Recent Labs Lab 09/08/14 2035  09/09/14 1420 09/10/14 0738 09/10/14 2050 09/11/14 0358 09/13/14 0915  NA 130*  < > 133* 136 135 137 135  K 4.7  < > 4.4 4.8 4.3 4.0 4.6  CL 98*  < > 104 101 101 103 99*  CO2 20*  < > 22 27 26 25 26   GLUCOSE 457*  < > 146* 230* 121* 86 144*  BUN 37*  < > 38* 30* 36* 39* 31*  CREATININE 4.73*  < > 4.69* 4.37* 4.59* 5.13* 5.64*  CALCIUM 8.4*  < > 9.3 9.0 9.0 9.2 9.0  AST 16  --   --   --   --   --   --   ALT 9*  --   --   --   --   --   --   ALKPHOS 108  --   --   --   --   --   --   BILITOT 0.5  --   --   --   --   --   --   < > = values in this interval not displayed. ------------------------------------------------------------------------------------------------------------------ estimated creatinine clearance is 12.1 mL/min (by C-G formula based on Cr of 5.64). ------------------------------------------------------------------------------------------------------------------ No results for input(s): HGBA1C in the last 72 hours. ------------------------------------------------------------------------------------------------------------------ No results for  input(s): CHOL, HDL, LDLCALC, TRIG, CHOLHDL, LDLDIRECT in the last 72 hours. ------------------------------------------------------------------------------------------------------------------ No results for input(s): TSH, T4TOTAL, T3FREE, THYROIDAB in the last 72 hours.  Invalid input(s): FREET3 ------------------------------------------------------------------------------------------------------------------ No results for input(s): VITAMINB12, FOLATE, FERRITIN, TIBC, IRON, RETICCTPCT in the last 72 hours.  Coagulation profile  Recent Labs Lab 09/09/14 0037  INR 1.05    No results for input(s): DDIMER in the last 72 hours.  Cardiac Enzymes No results for input(s): CKMB, TROPONINI, MYOGLOBIN in the last 168 hours.  Invalid input(s): CK ------------------------------------------------------------------------------------------------------------------ Invalid input(s): POCBNP    Aleksandr Pellow MD PhD on 09/13/2014 at 5:48 PM  Between 7am to 7pm - Pager - [phone]  After 7pm go to [email or web address] - password [email or web address]  And look for the night coverage person covering for me after hours  [facility] Office  [phone]

## 2014-09-13 NOTE — Progress Notes (Signed)
PT Cancellation Note  Patient Details Name: Aaron Long MRN: [id number] DOB: [birth year]   Cancelled Treatment:    Reason Eval/Treat Not Completed: Patient at procedure or test/unavailable.  Patient in HD.  Will return at later time for PT session.   [name] 09/13/2014, 12:05 PM [name] Sanjuana Kava, [place] Pager [phone]

## 2014-09-13 NOTE — Progress Notes (Signed)
Cath tip culture growing >100K Pantoea species, sensitive to everything but Ampicillin.  Discussed cultures with Dr. Justin Mend and decision made to hange to ceftazidime 2g IV qHD- MWF.   Also discussed odd species of bacteria with Dr. Johnnye Sima of ID on the side- recommended treatment for a few weeks.  Pharmacy will follow peripherally for HD schedule changes.  Ariday Brinker D. Jolynn Bajorek, PharmD, BCPS Clinical Pharmacist Pager: [phone] 09/13/2014 12:01 PM

## 2014-09-13 NOTE — Procedures (Signed)
I have seen and examined this patient and agree with the plan of care  Seen on dialysis with complaints of cold and numb right hand AVF -  Right   Lungs clear and no edema noted  ---    Cath tip  -- gram negative rods  Speciation Pending Aaron Long W 09/13/2014, 8:40 AM

## 2014-09-14 DIAGNOSIS — E1165 Type 2 diabetes mellitus with hyperglycemia: Secondary | ICD-10-CM

## 2014-09-14 DIAGNOSIS — E1129 Type 2 diabetes mellitus with other diabetic kidney complication: Secondary | ICD-10-CM

## 2014-09-14 LAB — GLUCOSE, CAPILLARY
GLUCOSE-CAPILLARY: 118 mg/dL — AB (ref 65–99)
Glucose-Capillary: 157 mg/dL — ABNORMAL HIGH (ref 65–99)

## 2014-09-14 MED ORDER — AMLODIPINE BESYLATE 10 MG PO TABS: 10.0000 mg | ORAL_TABLET | Freq: Every day | ORAL | Status: DC

## 2014-09-14 MED ORDER — CALCIUM ACETATE (PHOS BINDER) 667 MG PO CAPS: 667.0000 mg | ORAL_CAPSULE | Freq: Three times a day (TID) | ORAL | Status: DC

## 2014-09-14 MED ORDER — TAMSULOSIN HCL 0.4 MG PO CAPS
0.4000 mg | ORAL_CAPSULE | Freq: Every day | ORAL | Status: DC
Start: 2014-09-14 — End: 2015-01-09

## 2014-09-14 NOTE — Care Management Note (Addendum)
Case Management Note  Please previous CM notes   Patient Details  Name: Aaron Long MRN: [id number] Date of Birth: 1940-03-02  Subjective/Objective:          sepsis          Action/Plan:  Home Health [facility]-notified [facility] of scheduled dc home with HHPT, OT and RN. Unit RN states pt has contact number for [facility] in the room.   Expected Discharge Date:  09/14/2014               Expected Discharge Plan:  [place]  In-House Referral:  Clinical Social Work  Discharge planning Services  CM Consult  Post Acute Care Choice:  Home Health Choice offered to:  patient  HH Arranged:  PT, RN, OT [place] Agency:  [facility]  Status of Service:  Completed, signed off  Medicare Important Message Given:  Yes-second notification given Date Medicare IM Given:    Medicare IM give by:    Date Additional Medicare IM Given:    Additional Medicare Important Message give by:     If discussed at [place] of Stay Meetings, dates discussed:    Additional Comments:  Erenest Rasher, RN 09/14/2014, 1:37 PM

## 2014-09-14 NOTE — Progress Notes (Signed)
Patient discharged to home. No PIV or tele to remove. Discharge instructions reviewed with patient and patient's family member, including followup appts and medications. Pt requested a refill for amlodipine and phoslo; Dr. Erlinda Hong ordered both these medications. Home health arranged by CM.   Joellen Jersey, RN.

## 2014-09-14 NOTE — Progress Notes (Signed)
[facility] Progress Note  Assessment/Plan: 1. ESRD - HD MWF use avf without problems - evaluate K after d/c may need to change to 2 K bath 2. AMS/Sepsis - BC neg, urine neg but cathtip + pantoea >100 colonies sens to [place] - plan three weeks fortaz at outpt HD unit; MS issues cleared with tx of infx 3. Hypertension/volume-titrating EDW norvasc 10, cardura 1 -off hydralazine, BP still lowish off hydralazine 4. Anemia - hgb 10.0 stable since admission; had steady decline PTA probably due to infection - start aranesp 25 - hold Fe due to sepsis - last tsat 37% 5. Metabolic bone disease - Vit d and binder  6. ASCVD - on plavix-  7. DM type 2 - pre primary 8. Disp - d/c today  Myriam Jacobson, PA-C [facility] Beeper [phone] 09/14/2014,8:17 AM  LOS: 6 days   Subjective:   No c/o  Objective Filed Vitals:   09/13/14 1259 09/13/14 1752 09/13/14 2053 09/14/14 0546  BP: 92/59 100/62 132/68 114/51  Pulse: 82 77 80 78  Temp: 99.7 F (37.6 C) 97.9 F (36.6 C) 98.5 F (36.9 C) 98.1 F (36.7 C)  TempSrc: Oral Oral    Resp: 18 18 18 18   Height:      Weight:   81.194 kg (179 lb)   SpO2:  93% 98% 97%   Physical Exam General: NAD Heart: RRR Lungs: no rales Abdomen: soft NT Extremities: bilat BKA no edema Dialysis Access:  Right lower AVF + bruit  Dialysis Orders: [facility] on MWF . EDW 81.5kg HD Bath 3k,2.25ca Time 4hr Heparin 8500. Access R IJ Perm cath and RFA AVF BFR 400 DFR 800 Hec 5 mcg IV/HD o esa Units IV/HD Venofer 50mg  weekly hd  Other op labs= hgb 11.7, ca 9.7 , phos 5.0 pth 182 37% sat   Additional Objective Labs: Basic Metabolic Panel:  Recent Labs Lab 09/09/14 1420  09/10/14 2050 09/11/14 0358 09/13/14 0915  NA 133*  < > 135 137 135  K 4.4  < > 4.3 4.0 4.6  CL 104  < > 101 103 99*  CO2 22  < > 26 25 26   GLUCOSE 146*  < > 121* 86 144*  BUN 38*  < > 36* 39* 31*  CREATININE 4.69*  < > 4.59* 5.13* 5.64*   CALCIUM 9.3  < > 9.0 9.2 9.0  PHOS 3.9  --  4.4  --  5.1*  < > = values in this interval not displayed. Liver Function Tests:  Recent Labs Lab 09/08/14 2035 09/09/14 1420 09/10/14 2050 09/13/14 0915  AST 16  --   --   --   ALT 9*  --   --   --   ALKPHOS 108  --   --   --   BILITOT 0.5  --   --   --   PROT 5.9*  --   --   --   ALBUMIN 2.9* 2.9* 2.8* 2.7*   CBC:  Recent Labs Lab 09/08/14 2035  09/09/14 1420 09/10/14 0738 09/10/14 2050 09/11/14 0358 09/13/14 0915  WBC 10.2  < > 9.0 8.8 9.1 9.6 7.9  NEUTROABS 7.6  --  6.7  --   --   --   --   HGB 10.3*  < > 10.1* 10.4* 10.5* 9.6* 10.0*  HCT 31.3*  < > 31.5* 31.8* 31.6* 30.4* 30.8*  MCV 91.8  < > 92.9 93.5 92.9 92.7 92.5  PLT 168  < >  185 163 184 196 252  < > = values in this interval not displayed. Blood Culture    Component Value Date/Time   SDES CATH TIP 09/10/2014 1145   SPECREQUEST NONE 09/10/2014 1145   CULT  09/10/2014 1145    >100 COLONIES PANTOEA SPECIES Performed at [facility] 09/13/2014 FINAL 09/10/2014 1145   CBG:  Recent Labs Lab 09/13/14 0806 09/13/14 1346 09/13/14 1640 09/13/14 2045 09/14/14 0747  GLUCAP 110* 116* 215* 218* 118*  Medications:   . amLODipine  10 mg Oral QHS  . aspirin EC  81 mg Oral q morning - 10a  . calcium acetate  667 mg Oral TID WC  . cefTAZidime (FORTAZ)  IV  2 g Intravenous Q M,W,F-2000  . clopidogrel  75 mg Oral Daily  . darbepoetin (ARANESP) injection - DIALYSIS  25 mcg Intravenous Q Wed-HD  . doxazosin  1 mg Oral QHS  . doxercalciferol  5 mcg Intravenous Q M,W,F-HD  . famotidine  20 mg Oral QHS  . heparin  5,000 Units Subcutaneous 3 times per day  . insulin aspart  0-9 Units Subcutaneous TID WC  . insulin detemir  18 Units Subcutaneous BID  . lidocaine  10 mL Intradermal Once  . multivitamin  1 tablet Oral QHS  . polyethylene glycol  17 g Oral Daily  . pravastatin  20 mg Oral Daily  . pregabalin  75 mg Oral Daily  . QUEtiapine  25  mg Oral Daily  . senna-docusate  2 tablet Oral BID  . sodium chloride  3 mL Intravenous Q12H

## 2014-09-14 NOTE — Discharge Summary (Signed)
Discharge Summary  Aaron Long QAS:[id number] DOB: 06/07/1940  PCP: [name], NP  Admit date: 09/08/2014 Discharge date: 09/14/2014  Time spent: >34mins  Recommendations for Outpatient Follow-up:  1. F/u with PMD within a week for hospital discharge follow up 2. F/u with nephrology and continue dialysis MWF, patient to get iv fortaz during dialysis for three weeks  Discharge Diagnoses:  Active Hospital Problems   Diagnosis Date Noted  . Sepsis 09/08/2014  . Fever and chills 09/08/2014  . Acute encephalopathy 09/08/2014  . ESRD on dialysis   . Anemia of chronic disease 05/02/2014  . Status post bilateral below knee amputation 04/02/2014  . Cervical disc disorder with radiculopathy of cervical region 08/07/2013  . DM type 2, uncontrolled, with renal complications 79/89/2119  . Hyperlipidemia with target LDL less than 100 12/06/2012  . GERD (gastroesophageal reflux disease) 09/06/2012  . Anemia in chronic renal disease 07/24/2012  . Diabetic neuropathy, type II diabetes mellitus 07/27/2011  . Prostate cancer 04/28/2011  . PAD (peripheral artery disease) 04/28/2011  . HTN (hypertension) 04/28/2011  . CAD (coronary artery disease) 04/28/2011    Resolved Hospital Problems   Diagnosis Date Noted Date Resolved  No resolved problems to display.    Discharge Condition: stable  Diet recommendation: renal diet  Filed Weights   09/11/14 2035 09/13/14 0839 09/13/14 2053  Weight: 176 lb 5.9 oz (80 kg) 185 lb 10 oz (84.2 kg) 179 lb (81.194 kg)    History of present illness:  Aaron Long is a 74 y.o. male with PMH of ESRD-HD (MWF), CAD (s/p of CABG), hypertension, hyperlipidemia, diastolic congestive heart failure (EF of 60-65%), GERD, depression, carotid artery stenosis (s/p of right carotid artery endarterectomy), DVT, prostate cancer (s/p of surgery), BPH, bilateral BKA, who presents with fever, chills, confusion.  Patient reports that he started having fever  and chills since this morning, but no other symptoms, such as cough, runny nose, sore throat, chest pain, abdominal pain, diarrhea, symptoms of UTI, skin infection, ear pain, headache, neck rigidity, photophobia. No tenderness over his dialysis catheter in right upper chest. His wife reports that the patient was mildly confused, which has already resolved in the emergency room. She does not have rashes, unilateral weakness.  In ED, patient was found to have temperature 102.6, lactate 2.44, WBC 10.2, tachycardia, negative urinalysis, creatinine 4.73, BUN 37, negative chest x-ray for acute abnormalities. Patient is admitted to inpatient for further eval and treatment.  Hospital Course:  Principal Problem:   Sepsis Active Problems:   HTN (hypertension)   CAD (coronary artery disease)   PAD (peripheral artery disease)   Prostate cancer   Diabetic neuropathy, type II diabetes mellitus   Anemia in chronic renal disease   GERD (gastroesophageal reflux disease)   Hyperlipidemia with target LDL less than 100   DM type 2, uncontrolled, with renal complications   Cervical disc disorder with radiculopathy of cervical region   Status post bilateral below knee amputation   Anemia of chronic disease   Fever and chills   ESRD on dialysis   Acute encephalopathy  Sepsis , likely secondary to infected perm cath -Upon admission, patient was febrile with tachycardia and metabolic encephalophathy -Blood cultures show no growth to date -Chest x-ray shows no acute pulmonary findings -UA negative for infection -PermCath- removed on 8/30 and tip sent for culture + g- pantoea, resistant to cefazolin but sensitive to the rest of abx tested. -received iv vancomycin and Zosyn per pharmacy since admission, abx changed  to fortaz from 9/2 after permcath tip culture result. -patient is to get fortaz during dialysis for three weeks.  Acute encephalopathy -Likely secondary to sepsis -CT head: No acute intracranial  process -resolved,  returned to baseline  End-stage renal disease -Nephrology consulted and appreciated -Patient dialyzes MWF  Constipation -s/p Tap water enema, on miralax/senokot-s   Hypertension -bp low normal -Continue amlodipine, hydralazine d/ed, change doxazosin to flomax,   Diabetes mellitus, type II with neuropathy -Hemoglobin A1c 7.2 -Continue insulin sliding scale CBG monitoring -Continue Lyrica for neuropathy  Diastolic congestive heart failure -Echocardiogram 320 90,016 shows EF 60-65% with grade 1 diastolic dysfunction -Currently appears to be euvolemic and compensated. -Managed by dialysis  Coronary artery disease -Currently no chest pain -Continue aspirin, Plavix, statin  GERD -Continue Pepcid  Anemia of chronic disease -Hemoglobin currently stable, 10.4  Hyperlipidemia -Continue statin -LDL 97 (on 04/02/2014)  History prostate cancer -s/p surgery   BPH: change cardura to flomax due to borderline bp.  Code Status: Full  Family Communication: Patient   Disposition Plan: home on 9/3 with home health    Procedures  Removal of perm catheter dialysis  Consults  Nephrology  Vascular surgery   Discharge Exam: BP 114/51 mmHg  Pulse 78  Temp(Src) 98.1 F (36.7 C) (Oral)  Resp 18  Ht 5\' 8"  (1.727 m)  Wt 179 lb (81.194 kg)  BMI 27.22 kg/m2  SpO2 97%   General: Well developed, well nourished, No distress  HEENT: NCAT,mucous membranes moist.   Cardiovascular: S1 S2 auscultated, soft SEM, RRR  Respiratory: Clear to auscultation bilaterally with equal chest rise  Abdomen: Soft, nontender, nondistended, + bowel sounds  Extremities: B/L BKA, stumps wrapped. Right forearm AVF +B/T.  Neuro: AAOx3, nonfocal  Discharge Instructions You were cared for by a hospitalist during your hospital stay. If you have any questions about your discharge medications or the care you received while you were in the hospital after you are  discharged, you can call the unit and asked to speak with the hospitalist on call if the hospitalist that took care of you is not available. Once you are discharged, your primary care physician will handle any further medical issues. Please note that NO REFILLS for any discharge medications will be authorized once you are discharged, as it is imperative that you return to your primary care physician (or establish a relationship with a primary care physician if you do not have one) for your aftercare needs so that they can reassess your need for medications and monitor your lab values.      Discharge Instructions    Diet - low sodium heart healthy    Complete by:  As directed   Renal diet     Face-to-face encounter (required for Medicare/Medicaid patients)    Complete by:  As directed   I Keiarra Charon certify that this patient is under my care and that I, or a nurse practitioner or physician's assistant working with me, had a face-to-face encounter that meets the physician face-to-face encounter requirements with this patient on 09/13/2014. The encounter with the patient was in whole, or in part for the following medical condition(s) which is the primary reason for home health care (List medical condition): FTT  The encounter with the patient was in whole, or in part, for the following medical condition, which is the primary reason for home health care:  bilateral amuputee, ESRD on HD, sepsis, FTT  I certify that, based on my findings, the following services are medically necessary  home health services:   Nursing Physical therapy    Reason for Medically Necessary Home Health Services:  Skilled Nursing- Change/Decline in Patient Status  My clinical findings support the need for the above services:  Unable to leave home safely without assistance and/or assistive device  Further, I certify that my clinical findings support that this patient is homebound due to:  Unable to leave home safely without assistance      Home Health    Complete by:  As directed   To provide the following care/treatments:   PT OT RN       Increase activity slowly    Complete by:  As directed             Medication List    STOP taking these medications        ciprofloxacin 500 MG tablet  Commonly known as:  CIPRO     doxazosin 1 MG tablet  Commonly known as:  CARDURA     hydrALAZINE 100 MG tablet  Commonly known as:  APRESOLINE      TAKE these medications        AMBULATORY NON FORMULARY MEDICATION  One Touch Ultra 2 Test Strips Sig:  Check blood sugars Three times daily to keep blood sugars under control Dx: E11.9     AMBULATORY NON FORMULARY MEDICATION  BD U/P Mini Pen [place] 31GX5MM Use three times daily as directed DX: E11.9     amLODipine 10 MG tablet  Commonly known as:  NORVASC  Take 1 tablet (10 mg total) by mouth daily.     aspirin EC 81 MG tablet  Take 1 tablet (81 mg total) by mouth every morning.     B-D UF III MINI PEN NEEDLES 31G X 5 MM Misc  Generic drug:  Insulin Pen Needle  USE 3 TIMES A DAY AS DIRECTED     calcium acetate 667 MG capsule  Commonly known as:  PHOSLO  Take 1 capsule (667 mg total) by mouth 3 (three) times daily with meals.     cefTAZidime 2 g in dextrose 5 % 50 mL  Inject 2 g into the vein every Monday, Wednesday, and Friday at 8 PM.     clopidogrel 75 MG tablet  Commonly known as:  PLAVIX  take 1 tablet by mouth once daily     famotidine 20 MG tablet  Commonly known as:  PEPCID  Take 1 tablet (20 mg total) by mouth at bedtime.     HYDROcodone-acetaminophen 5-325 MG per tablet  Commonly known as:  NORCO/VICODIN  Take 1 tablet by mouth every 12 hours as needed for pain     insulin aspart 100 UNIT/ML FlexPen  Commonly known as:  NOVOLOG  Inject 5 Units into the skin 3 (three) times daily with meals. Inject 5 units three times a day with meals if blood sugar greater than 150 only, DX: 250.71     Insulin Detemir 100 UNIT/ML Pen  Commonly known as:   LEVEMIR  Inject 15 Units into the skin 2 (two) times daily.     multivitamin Tabs tablet  Take 1 tablet by mouth at bedtime.     ONETOUCH DELICA LANCETS 09X Misc  Use to test blood sugar three times daily. Dx. E11.9     pravastatin 20 MG tablet  Commonly known as:  PRAVACHOL  Take 1 tablet (20 mg total) by mouth daily.     pregabalin 75 MG capsule  Commonly known as:  LYRICA  Take one tablet by mouth once daily for pains     QUEtiapine 25 MG tablet  Commonly known as:  SEROQUEL  Take 1 tablet (25 mg total) by mouth daily.     senna-docusate 8.6-50 MG per tablet  Commonly known as:  Senokot-S  Take 2 tablets by mouth 2 (two) times daily.     tamsulosin 0.4 MG Caps capsule  Commonly known as:  FLOMAX  Take 1 capsule (0.4 mg total) by mouth daily.     Wheelchair Misc  Patient needs new Wheelchair due to his being broken.       Allergies  Allergen Reactions  . Oxycodone Other (See Comments)    Hallucinations   Follow-up Information    Follow up with EUBANKS, JESSICA K, NP In 1 week.   Specialty:  Nurse Practitioner   Why:  hospital discharge follow up   Contact information:   [place] [place] [address] [phone]        The results of significant diagnostics from this hospitalization (including imaging, microbiology, ancillary and laboratory) are listed below for reference.    Significant Diagnostic Studies: Dg Chest 2 View  09/08/2014   CLINICAL DATA:  Fever of 2-3 days duration.  EXAM: CHEST  2 VIEW  COMPARISON:  04/19/2014  FINDINGS: There is a right internal jugular central line extending into the SVC. There is mild unchanged left hemidiaphragm elevation and mild unchanged linear left base scarring. The lungs are otherwise clear. The pulmonary vasculature is normal. There is no pleural effusion. There is prior sternotomy and CABG.  IMPRESSION: Mild linear left base scarring.  No acute cardiopulmonary findings.   Electronically Signed   By: Andreas Newport M.D.   On: 09/08/2014 21:08   Ct Head Wo Contrast  09/08/2014   CLINICAL DATA:  Initial evaluation for acute altered mental status.  EXAM: CT HEAD WITHOUT CONTRAST  TECHNIQUE: Contiguous axial images were obtained from the base of the skull through the vertex without intravenous contrast.  COMPARISON:  Prior study from 04/17/2014.  FINDINGS: Atrophy with chronic microvascular ischemic disease present. Vascular calcifications present within the carotid siphons and distal vertebral arteries.  No acute large vessel territory infarct. No mass lesion, midline shift, or mass effect. Asymmetric hypodensity adjacent to the atrium of the left lateral ventricle a is grossly similar to previous. No associated edema. This is of doubtful clinical significance. No hydrocephalus. No extra-axial fluid collection.  Scalp soft tissues within normal limits. No acute abnormality about the orbits.  Paranasal sinuses and mastoid air cells are clear. Calvarium intact.  IMPRESSION: 1. No acute intracranial process. 2. Generalized cerebral atrophy with chronic microvascular ischemic disease, stable.   Electronically Signed   By: Jeannine Boga M.D.   On: 09/08/2014 23:19    Microbiology: Recent Results (from the past 240 hour(s))  Urine culture     Status: None   Collection Time: 09/08/14  8:13 PM  Result Value Ref Range Status   Specimen Description URINE, CLEAN CATCH  Final   Special Requests NONE  Final   Culture MULTIPLE SPECIES PRESENT, SUGGEST RECOLLECTION  Final   Report Status 09/10/2014 FINAL  Final  Culture, blood (routine x 2)     Status: None   Collection Time: 09/08/14  8:35 PM  Result Value Ref Range Status   Specimen Description BLOOD LEFT HAND  Final   Special Requests BOTTLES DRAWN AEROBIC ONLY 1CC  Final   Culture NO GROWTH 5 DAYS  Final  Report Status 09/13/2014 FINAL  Final  Culture, blood (routine x 2)     Status: None   Collection Time: 09/08/14 10:10 PM  Result Value Ref Range  Status   Specimen Description BLOOD LEFT HAND  Final   Special Requests   Final    BOTTLES DRAWN AEROBIC AND ANAEROBIC 2CC ANA 3CC AER   Culture NO GROWTH 5 DAYS  Final   Report Status 09/13/2014 FINAL  Final  MRSA PCR Screening     Status: None   Collection Time: 09/09/14  5:27 AM  Result Value Ref Range Status   MRSA by PCR NEGATIVE NEGATIVE Final    Comment:        The GeneXpert MRSA Assay (FDA approved for NASAL specimens only), is one component of a comprehensive MRSA colonization surveillance program. It is not intended to diagnose MRSA infection nor to guide or monitor treatment for MRSA infections.   Cath Tip Culture     Status: None   Collection Time: 09/10/14 11:45 AM  Result Value Ref Range Status   Specimen Description CATH TIP  Final   Special Requests NONE  Final   Culture   Final    >100 COLONIES PANTOEA SPECIES Performed at [facility]    Report Status 09/13/2014 FINAL  Final   Organism ID, Bacteria PANTOEA SPECIES  Final      Susceptibility   Pantoea species - MIC*    CEFAZOLIN RESISTANT      CEFEPIME <=1 SENSITIVE Sensitive     CEFTAZIDIME <=1 SENSITIVE Sensitive     CEFTRIAXONE <=1 SENSITIVE Sensitive     CIPROFLOXACIN <=0.25 SENSITIVE Sensitive     GENTAMICIN <=1 SENSITIVE Sensitive     IMIPENEM <=0.25 SENSITIVE Sensitive     PIP/TAZO <=4 SENSITIVE Sensitive     TOBRAMYCIN <=1 SENSITIVE Sensitive     TRIMETH/SULFA <=20 SENSITIVE Sensitive     * >100 COLONIES PANTOEA SPECIES     Labs: Basic Metabolic Panel:  Recent Labs Lab 09/09/14 1420 09/10/14 0738 09/10/14 2050 09/11/14 0358 09/13/14 0915  NA 133* 136 135 137 135  K 4.4 4.8 4.3 4.0 4.6  CL 104 101 101 103 99*  CO2 22 27 26 25 26   GLUCOSE 146* 230* 121* 86 144*  BUN 38* 30* 36* 39* 31*  CREATININE 4.69* 4.37* 4.59* 5.13* 5.64*  CALCIUM 9.3 9.0 9.0 9.2 9.0  PHOS 3.9  --  4.4  --  5.1*   Liver Function Tests:  Recent Labs Lab 09/08/14 2035 09/09/14 1420  09/10/14 2050 09/13/14 0915  AST 16  --   --   --   ALT 9*  --   --   --   ALKPHOS 108  --   --   --   BILITOT 0.5  --   --   --   PROT 5.9*  --   --   --   ALBUMIN 2.9* 2.9* 2.8* 2.7*   No results for input(s): LIPASE, AMYLASE in the last 168 hours. No results for input(s): AMMONIA in the last 168 hours. CBC:  Recent Labs Lab 09/08/14 2035  09/09/14 1420 09/10/14 0738 09/10/14 2050 09/11/14 0358 09/13/14 0915  WBC 10.2  < > 9.0 8.8 9.1 9.6 7.9  NEUTROABS 7.6  --  6.7  --   --   --   --   HGB 10.3*  < > 10.1* 10.4* 10.5* 9.6* 10.0*  HCT 31.3*  < > 31.5* 31.8* 31.6* 30.4* 30.8*  MCV 91.8  < >  92.9 93.5 92.9 92.7 92.5  PLT 168  < > 185 163 184 196 252  < > = values in this interval not displayed. Cardiac Enzymes: No results for input(s): CKTOTAL, CKMB, CKMBINDEX, TROPONINI in the last 168 hours. BNP: BNP (last 3 results) No results for input(s): BNP in the last 8760 hours.  ProBNP (last 3 results) No results for input(s): PROBNP in the last 8760 hours.  CBG:  Recent Labs Lab 09/13/14 0806 09/13/14 1346 09/13/14 1640 09/13/14 2045 09/14/14 0747  GLUCAP 110* 116* 215* 218* 118*       Signed:  Aycen Porreca MD, PhD  [facility] 09/14/2014, 8:59 AM

## 2014-09-16 DIAGNOSIS — D631 Anemia in chronic kidney disease: Secondary | ICD-10-CM | POA: Diagnosis not present

## 2014-09-16 DIAGNOSIS — T8249XA Other complication of vascular dialysis catheter, initial encounter: Secondary | ICD-10-CM | POA: Diagnosis not present

## 2014-09-16 DIAGNOSIS — N186 End stage renal disease: Secondary | ICD-10-CM | POA: Diagnosis not present

## 2014-09-16 DIAGNOSIS — E214 Other specified disorders of parathyroid gland: Secondary | ICD-10-CM | POA: Diagnosis not present

## 2014-09-16 DIAGNOSIS — E119 Type 2 diabetes mellitus without complications: Secondary | ICD-10-CM | POA: Diagnosis not present

## 2014-09-16 DIAGNOSIS — D509 Iron deficiency anemia, unspecified: Secondary | ICD-10-CM | POA: Diagnosis not present

## 2014-09-18 DIAGNOSIS — T8249XA Other complication of vascular dialysis catheter, initial encounter: Secondary | ICD-10-CM | POA: Diagnosis not present

## 2014-09-18 DIAGNOSIS — D631 Anemia in chronic kidney disease: Secondary | ICD-10-CM | POA: Diagnosis not present

## 2014-09-18 DIAGNOSIS — D509 Iron deficiency anemia, unspecified: Secondary | ICD-10-CM | POA: Diagnosis not present

## 2014-09-18 DIAGNOSIS — E214 Other specified disorders of parathyroid gland: Secondary | ICD-10-CM | POA: Diagnosis not present

## 2014-09-18 DIAGNOSIS — E119 Type 2 diabetes mellitus without complications: Secondary | ICD-10-CM | POA: Diagnosis not present

## 2014-09-18 DIAGNOSIS — N186 End stage renal disease: Secondary | ICD-10-CM | POA: Diagnosis not present

## 2014-09-20 DIAGNOSIS — E214 Other specified disorders of parathyroid gland: Secondary | ICD-10-CM | POA: Diagnosis not present

## 2014-09-20 DIAGNOSIS — D509 Iron deficiency anemia, unspecified: Secondary | ICD-10-CM | POA: Diagnosis not present

## 2014-09-20 DIAGNOSIS — D631 Anemia in chronic kidney disease: Secondary | ICD-10-CM | POA: Diagnosis not present

## 2014-09-20 DIAGNOSIS — E119 Type 2 diabetes mellitus without complications: Secondary | ICD-10-CM | POA: Diagnosis not present

## 2014-09-20 DIAGNOSIS — T8249XA Other complication of vascular dialysis catheter, initial encounter: Secondary | ICD-10-CM | POA: Diagnosis not present

## 2014-09-20 DIAGNOSIS — N186 End stage renal disease: Secondary | ICD-10-CM | POA: Diagnosis not present

## 2014-09-23 DIAGNOSIS — N186 End stage renal disease: Secondary | ICD-10-CM | POA: Diagnosis not present

## 2014-09-23 DIAGNOSIS — D509 Iron deficiency anemia, unspecified: Secondary | ICD-10-CM | POA: Diagnosis not present

## 2014-09-23 DIAGNOSIS — D631 Anemia in chronic kidney disease: Secondary | ICD-10-CM | POA: Diagnosis not present

## 2014-09-23 DIAGNOSIS — T8249XA Other complication of vascular dialysis catheter, initial encounter: Secondary | ICD-10-CM | POA: Diagnosis not present

## 2014-09-23 DIAGNOSIS — E214 Other specified disorders of parathyroid gland: Secondary | ICD-10-CM | POA: Diagnosis not present

## 2014-09-23 DIAGNOSIS — E119 Type 2 diabetes mellitus without complications: Secondary | ICD-10-CM | POA: Diagnosis not present

## 2014-09-25 DIAGNOSIS — D631 Anemia in chronic kidney disease: Secondary | ICD-10-CM | POA: Diagnosis not present

## 2014-09-25 DIAGNOSIS — E119 Type 2 diabetes mellitus without complications: Secondary | ICD-10-CM | POA: Diagnosis not present

## 2014-09-25 DIAGNOSIS — T8249XA Other complication of vascular dialysis catheter, initial encounter: Secondary | ICD-10-CM | POA: Diagnosis not present

## 2014-09-25 DIAGNOSIS — D509 Iron deficiency anemia, unspecified: Secondary | ICD-10-CM | POA: Diagnosis not present

## 2014-09-25 DIAGNOSIS — N186 End stage renal disease: Secondary | ICD-10-CM | POA: Diagnosis not present

## 2014-09-25 DIAGNOSIS — E214 Other specified disorders of parathyroid gland: Secondary | ICD-10-CM | POA: Diagnosis not present

## 2014-09-27 DIAGNOSIS — E119 Type 2 diabetes mellitus without complications: Secondary | ICD-10-CM | POA: Diagnosis not present

## 2014-09-27 DIAGNOSIS — D509 Iron deficiency anemia, unspecified: Secondary | ICD-10-CM | POA: Diagnosis not present

## 2014-09-27 DIAGNOSIS — N186 End stage renal disease: Secondary | ICD-10-CM | POA: Diagnosis not present

## 2014-09-27 DIAGNOSIS — D631 Anemia in chronic kidney disease: Secondary | ICD-10-CM | POA: Diagnosis not present

## 2014-09-27 DIAGNOSIS — T8249XA Other complication of vascular dialysis catheter, initial encounter: Secondary | ICD-10-CM | POA: Diagnosis not present

## 2014-09-27 DIAGNOSIS — E214 Other specified disorders of parathyroid gland: Secondary | ICD-10-CM | POA: Diagnosis not present

## 2014-09-30 ENCOUNTER — Emergency Department (HOSPITAL_COMMUNITY): Payer: Medicare Other

## 2014-09-30 ENCOUNTER — Observation Stay (HOSPITAL_COMMUNITY)
Admission: EM | Admit: 2014-09-30 | Discharge: 2014-10-04 | Disposition: A | Discharge status: Home / Self Care | Payer: Medicare Other | Attending: Internal Medicine | Admitting: Internal Medicine

## 2014-09-30 ENCOUNTER — Encounter (HOSPITAL_COMMUNITY): Payer: Self-pay | Admitting: Emergency Medicine

## 2014-09-30 DIAGNOSIS — E669 Obesity, unspecified: Secondary | ICD-10-CM | POA: Diagnosis not present

## 2014-09-30 DIAGNOSIS — K219 Gastro-esophageal reflux disease without esophagitis: Secondary | ICD-10-CM | POA: Diagnosis not present

## 2014-09-30 DIAGNOSIS — Z79891 Long term (current) use of opiate analgesic: Secondary | ICD-10-CM | POA: Diagnosis not present

## 2014-09-30 DIAGNOSIS — I120 Hypertensive chronic kidney disease with stage 5 chronic kidney disease or end stage renal disease: Secondary | ICD-10-CM | POA: Insufficient documentation

## 2014-09-30 DIAGNOSIS — M5010 Cervical disc disorder with radiculopathy, unspecified cervical region: Secondary | ICD-10-CM | POA: Diagnosis not present

## 2014-09-30 DIAGNOSIS — I639 Cerebral infarction, unspecified: Secondary | ICD-10-CM | POA: Diagnosis not present

## 2014-09-30 DIAGNOSIS — E1142 Type 2 diabetes mellitus with diabetic polyneuropathy: Secondary | ICD-10-CM | POA: Insufficient documentation

## 2014-09-30 DIAGNOSIS — R9439 Abnormal result of other cardiovascular function study: Secondary | ICD-10-CM | POA: Diagnosis not present

## 2014-09-30 DIAGNOSIS — R40241 Glasgow coma scale score 13-15: Secondary | ICD-10-CM | POA: Diagnosis not present

## 2014-09-30 DIAGNOSIS — E1140 Type 2 diabetes mellitus with diabetic neuropathy, unspecified: Secondary | ICD-10-CM

## 2014-09-30 DIAGNOSIS — Z7902 Long term (current) use of antithrombotics/antiplatelets: Secondary | ICD-10-CM | POA: Diagnosis not present

## 2014-09-30 DIAGNOSIS — Z86718 Personal history of other venous thrombosis and embolism: Secondary | ICD-10-CM | POA: Diagnosis not present

## 2014-09-30 DIAGNOSIS — E785 Hyperlipidemia, unspecified: Secondary | ICD-10-CM | POA: Diagnosis not present

## 2014-09-30 DIAGNOSIS — Z794 Long term (current) use of insulin: Secondary | ICD-10-CM | POA: Diagnosis not present

## 2014-09-30 DIAGNOSIS — R531 Weakness: Secondary | ICD-10-CM | POA: Diagnosis not present

## 2014-09-30 DIAGNOSIS — Z89511 Acquired absence of right leg below knee: Secondary | ICD-10-CM | POA: Insufficient documentation

## 2014-09-30 DIAGNOSIS — G459 Transient cerebral ischemic attack, unspecified: Principal | ICD-10-CM | POA: Diagnosis present

## 2014-09-30 DIAGNOSIS — N400 Enlarged prostate without lower urinary tract symptoms: Secondary | ICD-10-CM | POA: Diagnosis not present

## 2014-09-30 DIAGNOSIS — Z79899 Other long term (current) drug therapy: Secondary | ICD-10-CM | POA: Diagnosis not present

## 2014-09-30 DIAGNOSIS — Z951 Presence of aortocoronary bypass graft: Secondary | ICD-10-CM | POA: Insufficient documentation

## 2014-09-30 DIAGNOSIS — N2581 Secondary hyperparathyroidism of renal origin: Secondary | ICD-10-CM | POA: Insufficient documentation

## 2014-09-30 DIAGNOSIS — Z89512 Acquired absence of left leg below knee: Secondary | ICD-10-CM | POA: Diagnosis not present

## 2014-09-30 DIAGNOSIS — I252 Old myocardial infarction: Secondary | ICD-10-CM | POA: Insufficient documentation

## 2014-09-30 DIAGNOSIS — G451 Carotid artery syndrome (hemispheric): Secondary | ICD-10-CM | POA: Diagnosis not present

## 2014-09-30 DIAGNOSIS — I10 Essential (primary) hypertension: Secondary | ICD-10-CM | POA: Diagnosis not present

## 2014-09-30 DIAGNOSIS — D509 Iron deficiency anemia, unspecified: Secondary | ICD-10-CM | POA: Diagnosis not present

## 2014-09-30 DIAGNOSIS — R4182 Altered mental status, unspecified: Secondary | ICD-10-CM | POA: Diagnosis present

## 2014-09-30 DIAGNOSIS — E1122 Type 2 diabetes mellitus with diabetic chronic kidney disease: Secondary | ICD-10-CM | POA: Insufficient documentation

## 2014-09-30 DIAGNOSIS — Z01810 Encounter for preprocedural cardiovascular examination: Secondary | ICD-10-CM

## 2014-09-30 DIAGNOSIS — I255 Ischemic cardiomyopathy: Secondary | ICD-10-CM | POA: Insufficient documentation

## 2014-09-30 DIAGNOSIS — N186 End stage renal disease: Secondary | ICD-10-CM | POA: Diagnosis not present

## 2014-09-30 DIAGNOSIS — E1151 Type 2 diabetes mellitus with diabetic peripheral angiopathy without gangrene: Secondary | ICD-10-CM | POA: Insufficient documentation

## 2014-09-30 DIAGNOSIS — I6529 Occlusion and stenosis of unspecified carotid artery: Secondary | ICD-10-CM | POA: Insufficient documentation

## 2014-09-30 DIAGNOSIS — Z8546 Personal history of malignant neoplasm of prostate: Secondary | ICD-10-CM | POA: Insufficient documentation

## 2014-09-30 DIAGNOSIS — Z992 Dependence on renal dialysis: Secondary | ICD-10-CM | POA: Diagnosis not present

## 2014-09-30 DIAGNOSIS — Z87891 Personal history of nicotine dependence: Secondary | ICD-10-CM | POA: Diagnosis not present

## 2014-09-30 DIAGNOSIS — G9389 Other specified disorders of brain: Secondary | ICD-10-CM | POA: Diagnosis not present

## 2014-09-30 DIAGNOSIS — Z6827 Body mass index (BMI) 27.0-27.9, adult: Secondary | ICD-10-CM | POA: Insufficient documentation

## 2014-09-30 DIAGNOSIS — Z792 Long term (current) use of antibiotics: Secondary | ICD-10-CM | POA: Diagnosis not present

## 2014-09-30 DIAGNOSIS — I2510 Atherosclerotic heart disease of native coronary artery without angina pectoris: Secondary | ICD-10-CM | POA: Insufficient documentation

## 2014-09-30 DIAGNOSIS — Z7982 Long term (current) use of aspirin: Secondary | ICD-10-CM | POA: Insufficient documentation

## 2014-09-30 DIAGNOSIS — N189 Chronic kidney disease, unspecified: Secondary | ICD-10-CM

## 2014-09-30 DIAGNOSIS — I5032 Chronic diastolic (congestive) heart failure: Secondary | ICD-10-CM | POA: Diagnosis not present

## 2014-09-30 DIAGNOSIS — E214 Other specified disorders of parathyroid gland: Secondary | ICD-10-CM | POA: Diagnosis not present

## 2014-09-30 DIAGNOSIS — T8249XA Other complication of vascular dialysis catheter, initial encounter: Secondary | ICD-10-CM | POA: Diagnosis not present

## 2014-09-30 DIAGNOSIS — D631 Anemia in chronic kidney disease: Secondary | ICD-10-CM | POA: Diagnosis not present

## 2014-09-30 DIAGNOSIS — E119 Type 2 diabetes mellitus without complications: Secondary | ICD-10-CM | POA: Diagnosis not present

## 2014-09-30 LAB — I-STAT CHEM 8, ED
BUN: 38 mg/dL — AB (ref 6–20)
CHLORIDE: 97 mmol/L — AB (ref 101–111)
Calcium, Ion: 1.05 mmol/L — ABNORMAL LOW (ref 1.13–1.30)
Creatinine, Ser: 5.2 mg/dL — ABNORMAL HIGH (ref 0.61–1.24)
Glucose, Bld: 146 mg/dL — ABNORMAL HIGH (ref 65–99)
HEMATOCRIT: 38 % — AB (ref 39.0–52.0)
Hemoglobin: 12.9 g/dL — ABNORMAL LOW (ref 13.0–17.0)
POTASSIUM: 4.3 mmol/L (ref 3.5–5.1)
SODIUM: 139 mmol/L (ref 135–145)
TCO2: 27 mmol/L (ref 0–100)

## 2014-09-30 LAB — CBC
HCT: 35.1 % — ABNORMAL LOW (ref 39.0–52.0)
Hemoglobin: 11.1 g/dL — ABNORMAL LOW (ref 13.0–17.0)
MCH: 31.2 pg (ref 26.0–34.0)
MCHC: 31.6 g/dL (ref 30.0–36.0)
MCV: 98.6 fL (ref 78.0–100.0)
PLATELETS: 207 10*3/uL (ref 150–400)
RBC: 3.56 MIL/uL — AB (ref 4.22–5.81)
RDW: 16.7 % — AB (ref 11.5–15.5)
WBC: 5.6 10*3/uL (ref 4.0–10.5)

## 2014-09-30 LAB — COMPREHENSIVE METABOLIC PANEL
ALBUMIN: 3.2 g/dL — AB (ref 3.5–5.0)
ALK PHOS: 70 U/L (ref 38–126)
ALT: 8 U/L — AB (ref 17–63)
ANION GAP: 12 (ref 5–15)
AST: 16 U/L (ref 15–41)
BUN: 35 mg/dL — ABNORMAL HIGH (ref 6–20)
CHLORIDE: 96 mmol/L — AB (ref 101–111)
CO2: 30 mmol/L (ref 22–32)
Calcium: 8.9 mg/dL (ref 8.9–10.3)
Creatinine, Ser: 5.71 mg/dL — ABNORMAL HIGH (ref 0.61–1.24)
GFR calc non Af Amer: 9 mL/min — ABNORMAL LOW (ref >60)
GFR, EST AFRICAN AMERICAN: 10 mL/min — AB (ref >60)
GLUCOSE: 146 mg/dL — AB (ref 65–99)
Potassium: 4.4 mmol/L (ref 3.5–5.1)
SODIUM: 138 mmol/L (ref 135–145)
Total Bilirubin: 0.6 mg/dL (ref 0.3–1.2)
Total Protein: 6.7 g/dL (ref 6.5–8.1)

## 2014-09-30 LAB — I-STAT TROPONIN, ED: Troponin i, poc: 0.04 ng/mL (ref 0.00–0.08)

## 2014-09-30 LAB — DIFFERENTIAL
BASOS PCT: 1 %
Basophils Absolute: 0 10*3/uL (ref 0.0–0.1)
EOS PCT: 2 %
Eosinophils Absolute: 0.1 10*3/uL (ref 0.0–0.7)
LYMPHS PCT: 47 %
Lymphs Abs: 2.6 10*3/uL (ref 0.7–4.0)
MONO ABS: 0.3 10*3/uL (ref 0.1–1.0)
Monocytes Relative: 6 %
NEUTROS ABS: 2.5 10*3/uL (ref 1.7–7.7)
NEUTROS PCT: 44 %

## 2014-09-30 LAB — I-STAT CG4 LACTIC ACID, ED: LACTIC ACID, VENOUS: 1.99 mmol/L (ref 0.5–2.0)

## 2014-09-30 LAB — PROTIME-INR
INR: 1.04 (ref 0.00–1.49)
PROTHROMBIN TIME: 13.8 s (ref 11.6–15.2)

## 2014-09-30 LAB — APTT: APTT: 28 s (ref 24–37)

## 2014-09-30 MED ORDER — AMLODIPINE BESYLATE 10 MG PO TABS
10.0000 mg | ORAL_TABLET | Freq: Every day | ORAL | Status: DC
  Administered 2014-10-01: 10 mg via ORAL
  Filled 2014-09-30: qty 1

## 2014-09-30 MED ORDER — SODIUM CHLORIDE 0.9 % IV BOLUS (SEPSIS)
250.0000 mL | Freq: Once | INTRAVENOUS | Status: AC
  Administered 2014-09-30: 250 mL via INTRAVENOUS

## 2014-09-30 MED ORDER — INSULIN DETEMIR 100 UNIT/ML ~~LOC~~ SOLN
15.0000 [IU] | Freq: Two times a day (BID) | SUBCUTANEOUS | Status: DC
  Administered 2014-10-01 – 2014-10-03 (×5): 15 [IU] via SUBCUTANEOUS
  Filled 2014-09-30 (×10): qty 0.15

## 2014-09-30 MED ORDER — CALCIUM ACETATE (PHOS BINDER) 667 MG PO CAPS
667.0000 mg | ORAL_CAPSULE | Freq: Three times a day (TID) | ORAL | Status: DC
  Administered 2014-10-01 – 2014-10-03 (×6): 667 mg via ORAL
  Filled 2014-09-30 (×13): qty 1

## 2014-09-30 MED ORDER — ENOXAPARIN SODIUM 30 MG/0.3ML ~~LOC~~ SOLN
30.0000 mg | Freq: Every day | SUBCUTANEOUS | Status: DC
  Administered 2014-10-01 – 2014-10-03 (×3): 30 mg via SUBCUTANEOUS
  Filled 2014-09-30 (×5): qty 0.3

## 2014-09-30 MED ORDER — TAMSULOSIN HCL 0.4 MG PO CAPS
0.4000 mg | ORAL_CAPSULE | Freq: Every day | ORAL | Status: DC
  Administered 2014-10-01 – 2014-10-03 (×3): 0.4 mg via ORAL
  Filled 2014-09-30 (×3): qty 1

## 2014-09-30 MED ORDER — INSULIN ASPART 100 UNIT/ML ~~LOC~~ SOLN
0.0000 [IU] | Freq: Three times a day (TID) | SUBCUTANEOUS | Status: DC
  Administered 2014-10-01: 1 [IU] via SUBCUTANEOUS
  Administered 2014-10-01: 5 [IU] via SUBCUTANEOUS
  Administered 2014-10-01: 1 [IU] via SUBCUTANEOUS
  Administered 2014-10-02: 3 [IU] via SUBCUTANEOUS
  Administered 2014-10-03: 1 [IU] via SUBCUTANEOUS
  Administered 2014-10-03: 3 [IU] via SUBCUTANEOUS
  Administered 2014-10-03: 2 [IU] via SUBCUTANEOUS

## 2014-09-30 MED ORDER — CEFTAZIDIME 2 G IJ SOLR
2.0000 g | INTRAMUSCULAR | Status: DC
  Administered 2014-10-02: 2 g via INTRAVENOUS
  Filled 2014-09-30 (×3): qty 2

## 2014-09-30 MED ORDER — QUETIAPINE FUMARATE 25 MG PO TABS
25.0000 mg | ORAL_TABLET | Freq: Every day | ORAL | Status: DC
  Administered 2014-10-01 – 2014-10-03 (×4): 25 mg via ORAL
  Filled 2014-09-30 (×4): qty 1

## 2014-09-30 MED ORDER — CLOPIDOGREL BISULFATE 75 MG PO TABS
75.0000 mg | ORAL_TABLET | Freq: Every day | ORAL | Status: DC
  Administered 2014-10-01 – 2014-10-03 (×3): 75 mg via ORAL
  Filled 2014-09-30 (×4): qty 1

## 2014-09-30 MED ORDER — FAMOTIDINE 20 MG PO TABS
20.0000 mg | ORAL_TABLET | Freq: Every day | ORAL | Status: DC
  Administered 2014-10-01 – 2014-10-03 (×4): 20 mg via ORAL
  Filled 2014-09-30 (×4): qty 1

## 2014-09-30 MED ORDER — STROKE: EARLY STAGES OF RECOVERY BOOK
Freq: Once | Status: AC
  Administered 2014-10-01: 21:00:00
  Filled 2014-09-30: qty 1

## 2014-09-30 MED ORDER — SODIUM CHLORIDE 0.9 % IJ SOLN
3.0000 mL | Freq: Two times a day (BID) | INTRAMUSCULAR | Status: DC
  Administered 2014-10-01 – 2014-10-03 (×6): 3 mL via INTRAVENOUS

## 2014-09-30 MED ORDER — SENNOSIDES-DOCUSATE SODIUM 8.6-50 MG PO TABS
2.0000 | ORAL_TABLET | Freq: Two times a day (BID) | ORAL | Status: DC
  Administered 2014-10-01 – 2014-10-03 (×6): 2 via ORAL
  Filled 2014-09-30 (×6): qty 2

## 2014-09-30 MED ORDER — PREGABALIN 50 MG PO CAPS
75.0000 mg | ORAL_CAPSULE | Freq: Every day | ORAL | Status: DC
  Administered 2014-10-01 – 2014-10-04 (×4): 75 mg via ORAL
  Filled 2014-09-30 (×4): qty 1

## 2014-09-30 MED ORDER — ASPIRIN EC 81 MG PO TBEC
81.0000 mg | DELAYED_RELEASE_TABLET | Freq: Every morning | ORAL | Status: DC
  Administered 2014-10-01 – 2014-10-03 (×3): 81 mg via ORAL
  Filled 2014-09-30 (×3): qty 1

## 2014-09-30 MED ORDER — PRAVASTATIN SODIUM 20 MG PO TABS
20.0000 mg | ORAL_TABLET | Freq: Every day | ORAL | Status: DC
  Administered 2014-10-01: 20 mg via ORAL
  Filled 2014-09-30: qty 1

## 2014-09-30 MED ORDER — RENA-VITE PO TABS
1.0000 | ORAL_TABLET | Freq: Every day | ORAL | Status: DC
  Administered 2014-10-01 – 2014-10-03 (×4): 1 via ORAL
  Filled 2014-09-30 (×4): qty 1

## 2014-09-30 NOTE — ED Notes (Signed)
Pt in EMS from home, LSN 8pm. Wife took out trash, came back and found unresponsive. Pt normally A/O X4. Pt has R sided weakness and facial droop. CBG 147

## 2014-09-30 NOTE — ED Notes (Signed)
Pt back from MRI 

## 2014-09-30 NOTE — ED Notes (Signed)
Pt to MRI

## 2014-09-30 NOTE — H&P (Signed)
[facility] History and Physical  FLORENCE ANTONELLI OEV:[id number] DOB: 04-Mar-1940 DOA: 09/30/2014  Referring physician: Mayer Camel, MD PCP: [name], NP   Chief Complaint: Altered mental status  HPI: ABANOUB HANKEN is a 74 y.o. male with a past medical history of hypertension, hyperlipidemia type 2 diabetes, CAD status post CABG, chronic ischemic cardiomyopathy, status post bilateral BKA, diabetic neuropathy, end-stage renal disease on hemodialysis who was brought to the emergency department via EMS due to altered mental status with right arm and right facial area weakness.  Patient's wife states that after returning from hemodialysis around 1700 things were uneventful. Patient sat down in his wheelchair for a while and his wife reports him becoming unresponsive. When EMS arrived, they noticed that the patient's right arm and face were weak and he was not answering questions. However, while he was being transported to the hospital he became responsive and his arm and facial weakness improved.  He is currently in no acute distress. Workup in the ER so far has been benign.   Review of Systems:  Constitutional:  No weight loss, night sweats,  No Fevers, chills, fatigue since he was discharged earlier this month.  HEENT:  No headaches, Difficulty swallowing,Tooth/dental problems,Sore throat,  No sneezing, itching, ear ache, nasal congestion, post nasal drip,  Cardio-vascular:  No chest pain, Orthopnea, PND, swelling in lower extremities, anasarca, dizziness, palpitations  GI:  No heartburn, indigestion, abdominal pain, nausea, vomiting, diarrhea, change in bowel habits, loss of appetite  Resp:  No shortness of breath with exertion or at rest. No excess mucus, no productive cough, No non-productive cough, No coughing up of blood.No change in color of mucus.No wheezing.No chest wall deformity  Skin:  no rash or lesions.  GU:  no dysuria, change in color of urine,  no urgency or frequency. No flank pain.  Musculoskeletal:  Patient has bilateral BKA. No joint pain or swelling. No decreased range of motion. No back pain.  Psych:  No change in mood or affect. No depression or anxiety. No memory loss.  Neuro: As above mentioned. Past Medical History  Diagnosis Date  . PAD (peripheral artery disease)     a. R CEA 2007. b. Hx L BKA in 2013. c. s/p LE angioplasty in 2014. d. Hx R BKA in 2014.  . Gangrene of toe     dry  . Neuromuscular disorder     diabetic neuropathy  . Hypertension   . Hyperlipidemia   . Peripheral neuropathy   . Constipation     takes Miralax daily  . Hemorrhoids   . Hx of colonic polyps   . Coronary artery disease     a. s/p NSTEMI/CABGx4 in 2007 - LIMA-LAD, SVG-optional diagonal, SVVG-OM, SVG-dRCA. b. Nuc 05/2011: nonischemic.  [name] BPH (benign prostatic hypertrophy)   . Abnormality of gait   . DVT (deep venous thrombosis)     a. Lower extremity DVT in 2013.  [name] Hypertrophy of prostate without urinary obstruction and other lower urinary tract symptoms (LUTS)   . Disorder of bone and cartilage, unspecified   . Lower limb amputation, below knee   . Anemia   . Secondary hyperparathyroidism     Secondary Hyperpara- Thyroidism, Renal  . Ischemic cardiomyopathy     a. EF 40% in 2007. b. 51% by nuc 05/2011.  [name] Complication of anesthesia     "he gets delirious"  . Myocardial infarction 2005  . Type II diabetes mellitus   . Diabetic peripheral neuropathy   .  ESRD on dialysis   . Kidney stones   . Prostate cancer 2010   Past Surgical History  Procedure Laterality Date  . Prostatectomy  2009  . Knee cartilage surgery Left 1964  . Coronary artery bypass graft  2005    CABG X4  . Carotid endarterectomy Right 2005  . Cataract extraction w/ intraocular lens  implant, bilateral Bilateral 2011  . Colonoscopy    . Cardiac catheterization  05/28/11  . Amputation  06/14/2011    Procedure: AMPUTATION DIGIT;  Surgeon: Elam Dutch,  MD;  Location: [facility] OR;  Service: Vascular;  Laterality: Left;  Amputation Left fifth toe  . Amputation  06/16/2011    Procedure: AMPUTATION BELOW KNEE;  Surgeon: Elam Dutch, MD;  Location: [place];  Service: Vascular;  Laterality: Left;  . I&d extremity  01/31/2012    Procedure: IRRIGATION AND DEBRIDEMENT EXTREMITY;  Surgeon: Elam Dutch, MD;  Location: [place];  Service: Vascular;  Laterality: Left;  I & D Left BKA   . Amputation Right 06/12/2012    Procedure: AMPUTATION DIGIT;  Surgeon: Elam Dutch, MD;  Location: [facility] OR;  Service: Vascular;  Laterality: Right;  GREAT TOE  . Amputation Right 08/08/2012    Procedure: AMPUTATION BELOW KNEE;  Surgeon: Elam Dutch, MD;  Location: [facility] OR;  Service: Vascular;  Laterality: Right;  . Abdominal aortagram N/A 05/28/2011    Procedure: ABDOMINAL Maxcine Ham;  Surgeon: Elam Dutch, MD;  Location: [facility] CATH LAB;  Service: Cardiovascular;  Laterality: N/A;  . Abdominal aortagram N/A 06/02/2012    Procedure: ABDOMINAL Maxcine Ham;  Surgeon: Elam Dutch, MD;  Location: [facility] CATH LAB;  Service: Cardiovascular;  Laterality: N/A;  . Abdominal aortagram N/A 06/09/2012    Procedure: ABDOMINAL Maxcine Ham;  Surgeon: Elam Dutch, MD;  Location: [facility] CATH LAB;  Service: Cardiovascular;  Laterality: N/A;  . Insertion of dialysis catheter Right 04/19/2014    Procedure: INSERTION OF DIALYSIS CATHETER-INTERNAL JUGULAR;  Surgeon: Mal Misty, MD;  Location: [place];  Service: Vascular;  Laterality: Right;  . Av fistula placement Right 04/19/2014    Procedure: RIGHT ARM ARTERIOVENOUS (AV) FISTULA CREATION;  Surgeon: Mal Misty, MD;  Location: [place];  Service: Vascular;  Laterality: Right;  . Ligation of competing branches of arteriovenous fistula Right 06/19/2014    Procedure: Right Arm LIGATION OF COMPETING BRANCHES OF RADIOCEPHALIC ARTERIOVENOUS FISTULA;  Surgeon: Mal Misty, MD;  Location: [place];  Service: Vascular;  Laterality: Right;   Social History:   reports that he quit smoking about 23 years ago. His smoking use included Cigarettes. He has a 30 pack-year smoking history. He has never used smokeless tobacco. He reports that he drinks alcohol. He reports that he does not use illicit drugs.  Allergies  Allergen Reactions  . Oxycodone Other (See Comments)    Hallucinations    Family History  Problem Relation Age of Onset  . Coronary artery disease Neg Hx   . Anesthesia problems Neg Hx   . Hypotension Neg Hx   . Malignant hyperthermia Neg Hx   . Pseudochol deficiency Neg Hx   . Hyperlipidemia Mother   . Hypertension Mother   . Cancer Mother   . Hyperlipidemia Father   . Hypertension Father   . Kidney disease Father   . Heart disease Sister   . Alzheimer's disease Sister   . Cancer Brother   . Other Sister     Prior to Admission medications   Medication Sig  Start Date End Date Taking? Authorizing Provider  AMBULATORY NON FORMULARY MEDICATION One Touch Ultra 2 Test Strips Sig:  Check blood sugars Three times daily to keep blood sugars under control Dx: E11.9 09/09/14   [name], NP  AMBULATORY NON FORMULARY MEDICATION BD U/P Mini Pen [place] 31GX5MM Use three times daily as directed DX: E11.9 09/09/14   [name], NP  amLODipine (NORVASC) 10 MG tablet Take 1 tablet (10 mg total) by mouth daily. 09/14/14   Florencia Reasons, MD  aspirin EC 81 MG tablet Take 1 tablet (81 mg total) by mouth every morning. 08/21/12   Lavon Paganini Angiulli, PA-C  B-D UF III MINI PEN NEEDLES 31G X 5 MM MISC USE 3 TIMES A DAY AS DIRECTED 09/09/14   [name], NP  calcium acetate (PHOSLO) 667 MG capsule Take 1 capsule (667 mg total) by mouth 3 (three) times daily with meals. 09/14/14   Florencia Reasons, MD  cefTAZidime 2 g in dextrose 5 % 50 mL Inject 2 g into the vein every Monday, Wednesday, and Friday at 8 PM. 09/13/14   Florencia Reasons, MD  clopidogrel (PLAVIX) 75 MG tablet take 1 tablet by mouth once daily 02/11/14   Blanchie Serve, MD  famotidine (PEPCID) 20 MG  tablet Take 1 tablet (20 mg total) by mouth at bedtime. Patient not taking: Reported on 09/08/2014 05/03/14   Ivan Anchors Love, PA-C  HYDROcodone-acetaminophen (NORCO/VICODIN) 5-325 MG per tablet Take 1 tablet by mouth every 12 hours as needed for pain 08/14/14   Estill Dooms, MD  insulin aspart (NOVOLOG) 100 UNIT/ML FlexPen Inject 5 Units into the skin 3 (three) times daily with meals. Inject 5 units three times a day with meals if blood sugar greater than 150 only, DX: 250.71 08/07/13   Blanchie Serve, MD  Insulin Detemir (LEVEMIR) 100 UNIT/ML Pen Inject 15 Units into the skin 2 (two) times daily. 05/03/14   Bary Leriche, PA-C  Misc. Devices [facility]) [place] Patient needs new Wheelchair due to his being broken. 01/09/14   Blanchie Serve, MD  multivitamin (RENA-VIT) TABS tablet Take 1 tablet by mouth at bedtime. 05/03/14   Ivan Anchors Love, PA-C  ONETOUCH DELICA LANCETS 66A MISC Use to test blood sugar three times daily. Dx. E11.9 09/09/14   [name], NP  pravastatin (PRAVACHOL) 20 MG tablet Take 1 tablet (20 mg total) by mouth daily. 08/07/13   Blanchie Serve, MD  pregabalin (LYRICA) 75 MG capsule Take one tablet by mouth once daily for pains 08/30/14   Tiffany L Reed, DO  QUEtiapine (SEROQUEL) 25 MG tablet Take 1 tablet (25 mg total) by mouth daily. 05/03/14   Ivan Anchors Love, PA-C  senna-docusate (SENOKOT-S) 8.6-50 MG per tablet Take 2 tablets by mouth 2 (two) times daily. 04/25/14   Florencia Reasons, MD  tamsulosin (FLOMAX) 0.4 MG CAPS capsule Take 1 capsule (0.4 mg total) by mouth daily. 09/14/14   Florencia Reasons, MD   Physical Exam: Filed Vitals:   09/30/14 2130 09/30/14 2145 09/30/14 2315 09/30/14 2316  BP: 96/81 132/61 158/84   Pulse: 76 73  74  Resp: 16 14  14   SpO2: 96% 100%  98%    Wt Readings from Last 3 Encounters:  09/13/14 81.194 kg (179 lb)  06/19/14 92.987 kg (205 lb)  06/11/14 92.987 kg (205 lb)    General:  Appears calm and comfortable Eyes: PERRL, normal lids, irises & conjunctiva ENT:  grossly normal hearing, lips & tongue Neck: Positive  right cervical area surgical scar, positive right carotid bruit, no masses or thyromegaly, no JVD. Cardiovascular: RRR, no m/r/g. No LE edema. Telemetry: SR, no arrhythmias  Respiratory: CTA bilaterally, no w/r/r. Normal respiratory effort. Abdomen: soft, ntnd Skin: no rash or induration seen on limited exam Musculoskeletal: Bilateral BKA. Psychiatric: grossly normal mood and affect, speech fluent and appropriate Neurologic: grossly non-focal.          Labs on Admission:  Basic Metabolic Panel:  Recent Labs Lab 09/30/14 2054 09/30/14 2104  NA 138 139  K 4.4 4.3  CL 96* 97*  CO2 30  --   GLUCOSE 146* 146*  BUN 35* 38*  CREATININE 5.71* 5.20*  CALCIUM 8.9  --    Liver Function Tests:  Recent Labs Lab 09/30/14 2054  AST 16  ALT 8*  ALKPHOS 70  BILITOT 0.6  PROT 6.7  ALBUMIN 3.2*   CBC:  Recent Labs Lab 09/30/14 2054 09/30/14 2104  WBC 5.6  --   NEUTROABS 2.5  --   HGB 11.1* 12.9*  HCT 35.1* 38.0*  MCV 98.6  --   PLT 207  --     Radiological Exams on Admission: Ct Head Wo Contrast  09/30/2014   CLINICAL DATA:  Altered mental status -code stroke.  EXAM: CT HEAD WITHOUT CONTRAST  TECHNIQUE: Contiguous axial images were obtained from the base of the skull through the vertex without intravenous contrast.  COMPARISON:  09/08/2014 and prior head CT  FINDINGS: Atrophy and mild chronic small-vessel white matter ischemic changes again noted.  No acute intracranial abnormalities are identified, including mass lesion or mass effect, hydrocephalus, extra-axial fluid collection, midline shift, hemorrhage, or acute infarction.  The visualized bony calvarium is unremarkable.  IMPRESSION: No evidence of acute intracranial abnormality.  Atrophy and chronic small-vessel white matter ischemic changes.  Critical Value/emergent results were called by telephone at the time of interpretation on 09/30/2014 at 9:09 pm to Dr. Armida Sans , who  verbally acknowledged these results.   Electronically Signed   By: Margarette Canada M.D.   On: 09/30/2014 21:11   Mr [name] Head Wo Contrast  09/30/2014   CLINICAL DATA:  Initial evaluation for acute unresponsiveness, transient right-sided weakness.  EXAM: MRI HEAD WITHOUT CONTRAST  MRA HEAD WITHOUT CONTRAST  TECHNIQUE: Multiplanar, multiecho pulse sequences of the brain and surrounding structures were obtained without intravenous contrast. Angiographic images of the head were obtained using MRA technique without contrast.  COMPARISON:  Prior CT from earlier the same day.  FINDINGS: MRI HEAD FINDINGS  Diffuse prominence of the CSF containing spaces compatible with generalized cerebral atrophy. Mild T2/FLAIR hyperintensity within the periventricular white matter most likely related to chronic small vessel ischemic disease. Probable small remote lacunar infarct within the right basal ganglia.  No abnormal foci of restricted diffusion to suggest acute intracranial infarct. Gray-white matter differentiation maintained. Normal intravascular flow voids preserved. No acute or chronic intracranial hemorrhage.  No mass lesion, midline shift, or mass effect. Mild ventricular prominence related to global parenchymal volume loss present without hydrocephalus. No extra-axial fluid collection.  Craniocervical junction within normal limits. Incidental note made of a partially empty sella.  No acute abnormality about the orbits. Sequela prior bilateral lens extraction noted.  Mild mucosal thickening within the ethmoidal air cells and right maxillary sinus. Paranasal sinuses are otherwise clear. No mastoid effusion. Inner ear structures grossly normal.  Bone marrow signal intensity within normal limits. No scalp soft tissue abnormality.  MRA HEAD FINDINGS  ANTERIOR CIRCULATION:  Study mildly  degraded by motion artifact.  Visualized distal cervical segments of the internal carotid arteries are widely patent with antegrade flow. Petrous  segments are widely patent. Atheromatous irregularity with associated moderate narrowing present within the distal cavernous/ supraclinoid ICAs bilaterally. Left A1 segment hypoplastic but patent. Right A1 segment widely patent. Anterior communicating artery and anterior cerebral arteries within normal limits. M1 segments patent without stenosis or occlusion. MCA bifurcations normal. Distal MCA branches symmetric and well opacified.  POSTERIOR CIRCULATION:  Right vertebral artery is diminutive and attenuated to the level of the vertebrobasilar junction. Dominant left vertebral artery widely patent. Left posterior inferior cerebral artery is patent proximally. Right posterior inferior cerebral artery not well visualized. Basilar artery well opacified. Irregularity at the mid aspect of the basilar artery favored to be related to motion artifact. Superior cerebellar arteries well opacified. Both posterior cerebral arteries arise from the basilar artery. There is a short-segment moderate to severe stenosis within the left P1 segment. Posterior cerebral arteries otherwise well opacified.  IMPRESSION: MRI HEAD IMPRESSION:  1. No acute intracranial infarct or other abnormality identified. 2. Generalized cerebral atrophy with mild chronic small vessel ischemic disease.  MRA HEAD IMPRESSION:  1. No large or proximal arterial branch occlusion within the intracranial circulation. 2. Attenuated flow within the diminutive right vertebral artery. Dominant left vertebral artery widely patent to the vertebrobasilar junction. 3. Short-segment moderate to severe nonocclusive stenosis within the left P1 segment. 4. Atheromatous irregularity with multifocal moderate narrowing of the distal cavernous/ supraclinoid ICAs bilaterally.   Electronically Signed   By: Jeannine Boga M.D.   On: 09/30/2014 23:29   Mr Brain Wo Contrast  09/30/2014   CLINICAL DATA:  Initial evaluation for acute unresponsiveness, transient right-sided  weakness.  EXAM: MRI HEAD WITHOUT CONTRAST  MRA HEAD WITHOUT CONTRAST  TECHNIQUE: Multiplanar, multiecho pulse sequences of the brain and surrounding structures were obtained without intravenous contrast. Angiographic images of the head were obtained using MRA technique without contrast.  COMPARISON:  Prior CT from earlier the same day.  FINDINGS: MRI HEAD FINDINGS  Diffuse prominence of the CSF containing spaces compatible with generalized cerebral atrophy. Mild T2/FLAIR hyperintensity within the periventricular white matter most likely related to chronic small vessel ischemic disease. Probable small remote lacunar infarct within the right basal ganglia.  No abnormal foci of restricted diffusion to suggest acute intracranial infarct. Gray-white matter differentiation maintained. Normal intravascular flow voids preserved. No acute or chronic intracranial hemorrhage.  No mass lesion, midline shift, or mass effect. Mild ventricular prominence related to global parenchymal volume loss present without hydrocephalus. No extra-axial fluid collection.  Craniocervical junction within normal limits. Incidental note made of a partially empty sella.  No acute abnormality about the orbits. Sequela prior bilateral lens extraction noted.  Mild mucosal thickening within the ethmoidal air cells and right maxillary sinus. Paranasal sinuses are otherwise clear. No mastoid effusion. Inner ear structures grossly normal.  Bone marrow signal intensity within normal limits. No scalp soft tissue abnormality.  MRA HEAD FINDINGS  ANTERIOR CIRCULATION:  Study mildly degraded by motion artifact.  Visualized distal cervical segments of the internal carotid arteries are widely patent with antegrade flow. Petrous segments are widely patent. Atheromatous irregularity with associated moderate narrowing present within the distal cavernous/ supraclinoid ICAs bilaterally. Left A1 segment hypoplastic but patent. Right A1 segment widely patent. Anterior  communicating artery and anterior cerebral arteries within normal limits. M1 segments patent without stenosis or occlusion. MCA bifurcations normal. Distal MCA branches symmetric and well opacified.  POSTERIOR  CIRCULATION:  Right vertebral artery is diminutive and attenuated to the level of the vertebrobasilar junction. Dominant left vertebral artery widely patent. Left posterior inferior cerebral artery is patent proximally. Right posterior inferior cerebral artery not well visualized. Basilar artery well opacified. Irregularity at the mid aspect of the basilar artery favored to be related to motion artifact. Superior cerebellar arteries well opacified. Both posterior cerebral arteries arise from the basilar artery. There is a short-segment moderate to severe stenosis within the left P1 segment. Posterior cerebral arteries otherwise well opacified.  IMPRESSION: MRI HEAD IMPRESSION:  1. No acute intracranial infarct or other abnormality identified. 2. Generalized cerebral atrophy with mild chronic small vessel ischemic disease.  MRA HEAD IMPRESSION:  1. No large or proximal arterial branch occlusion within the intracranial circulation. 2. Attenuated flow within the diminutive right vertebral artery. Dominant left vertebral artery widely patent to the vertebrobasilar junction. 3. Short-segment moderate to severe nonocclusive stenosis within the left P1 segment. 4. Atheromatous irregularity with multifocal moderate narrowing of the distal cavernous/ supraclinoid ICAs bilaterally.   Electronically Signed   By: Jeannine Boga M.D.   On: 09/30/2014 23:29    Echocardiogram: ------------------------------------------------------------------- Study Conclusions  - Left ventricle: The cavity size was normal. Wall thickness was increased in a pattern of mild LVH. There was moderate focal basal hypertrophy of the septum. Systolic function was normal. The estimated ejection fraction was in the range of 60%  to 65%. Wall motion was normal; there were no regional wall motion abnormalities. Doppler parameters are consistent with abnormal left ventricular relaxation (grade 1 diastolic dysfunction). The E/e&' ratio is between 8-15, suggesting indeterminate LV fililng pressure. - Aortic valve: Sclerosis without stenosis. There was no regurgitation. - Mitral valve: Mildly thickened leaflets . There was mild regurgitation. - Left atrium: The atrium was moderately dilated at 41 ml/m2. - Inferior vena cava: The vessel was normal in size. The respirophasic diameter changes were in the normal range (>= 50%), consistent with normal central venous pressure.  Impressions:  - LVEF 60-65%, mild LVH, moderate basal septal hypertrophy, normal wall motion, diastolic dysfunction, indeterminate LV filling pressure, mild MR, moderately dilated LA, normal IVC.   EKG: Independently reviewed.  Vent. rate 74 BPM PR interval 161 ms QRS duration 84 ms QT/QTc 417/463 ms P-R-T axes 53 14 5 Sinus rhythm Atrial premature complexes Borderline T abnormalities, lateral leads No significant change from previous EKG.   Assessment/Plan Principal Problem:   TIA (transient ischemic attack) Admit for telemetry monitoring. Frequent neuro checks. Check MRI/MRA brain. Check echocardiogram. Check carotid Doppler. Check fasting lipids and hemoglobin A1c. Follow-up neurology consult.  Active Problems:   HTN (hypertension) Continue current antihypertensive therapy and monitor blood pressure.    S/P BKA (below knee amputation) Supportive care.    Diabetic neuropathy, type II diabetes mellitus Continue Lyrica for diabetic neuropathy. Continue long-acting insulin twice a day.    Anemia in chronic renal disease Continue Epogen as needed and monitor H&H.    ESRD on dialysis Continue hemodialysis as scheduled. Continue Ceftazidime before dialysis.    Dr. Dorian Pod from the  neurohospitalist service was consulted by the emergency department.  Code Status: Full code. DVT Prophylaxis: Lovenox SQ. Family CommunicationJimmey, [place] [phone]  [phone]  Disposition Plan: Admit for TIA/stroke workup.  Time spent: Over 70 minutes were spent during this admission.  Reubin Milan [facility] Pager [phone].

## 2014-09-30 NOTE — ED Notes (Signed)
Hospitalist at bedside 

## 2014-09-30 NOTE — ED Provider Notes (Signed)
CSN: [id number]     Arrival date & time 09/30/14  2051 History   First MD Initiated Contact with Patient 09/30/14 2054     Chief Complaint  Patient presents with  . Altered Mental Status   Patient is a 74 y.o. male presenting with general illness. The history is provided by the patient, the EMS personnel and medical records. The history is limited by the condition of the patient. No language interpreter was used.  Illness Location:  Generalized Quality:  AMS, weakness Severity:  Moderate Onset quality:  Sudden Timing:  Constant Progression:  Partially resolved Chronicity:  New Context:  PMHx HTN, HLD, DM, PAD, previous DVT, PAD s/p bilateral BKA, ICM, & ESRD (MWF) who presents w/ AMS, concerning for a stroke. Went to dialysis as scheduled today. Returned and was normal throughout afternoon. Last seen normal at 8 PM. Wife found patient with decrease responsiveness. EMS called for CPR but patient responsive upon arrival Code stoke was initiated via EMS.   Past Medical History  Diagnosis Date  . PAD (peripheral artery disease)     a. R CEA 2007. b. Hx L BKA in 2013. c. s/p LE angioplasty in 2014. d. Hx R BKA in 2014.  . Gangrene of toe     dry  . Neuromuscular disorder     diabetic neuropathy  . Hypertension   . Hyperlipidemia   . Peripheral neuropathy   . Constipation     takes Miralax daily  . Hemorrhoids   . Hx of colonic polyps   . Coronary artery disease     a. s/p NSTEMI/CABGx4 in 2007 - LIMA-LAD, SVG-optional diagonal, SVVG-OM, SVG-dRCA. b. Nuc 05/2011: nonischemic.  [name] BPH (benign prostatic hypertrophy)   . Abnormality of gait   . DVT (deep venous thrombosis)     a. Lower extremity DVT in 2013.  [name] Hypertrophy of prostate without urinary obstruction and other lower urinary tract symptoms (LUTS)   . Disorder of bone and cartilage, unspecified   . Lower limb amputation, below knee   . Anemia   . Secondary hyperparathyroidism     Secondary Hyperpara- Thyroidism, Renal  .  Ischemic cardiomyopathy     a. EF 40% in 2007. b. 51% by nuc 05/2011.  [name] Complication of anesthesia     "he gets delirious"  . Myocardial infarction 2005  . Type II diabetes mellitus   . Diabetic peripheral neuropathy   . ESRD on dialysis   . Kidney stones   . Prostate cancer 2010   Past Surgical History  Procedure Laterality Date  . Prostatectomy  2009  . Knee cartilage surgery Left 1964  . Coronary artery bypass graft  2005    CABG X4  . Carotid endarterectomy Right 2005  . Cataract extraction w/ intraocular lens  implant, bilateral Bilateral 2011  . Colonoscopy    . Cardiac catheterization  05/28/11  . Amputation  06/14/2011    Procedure: AMPUTATION DIGIT;  Surgeon: Elam Dutch, MD;  Location: [facility] OR;  Service: Vascular;  Laterality: Left;  Amputation Left fifth toe  . Amputation  06/16/2011    Procedure: AMPUTATION BELOW KNEE;  Surgeon: Elam Dutch, MD;  Location: [place];  Service: Vascular;  Laterality: Left;  . I&d extremity  01/31/2012    Procedure: IRRIGATION AND DEBRIDEMENT EXTREMITY;  Surgeon: Elam Dutch, MD;  Location: [place];  Service: Vascular;  Laterality: Left;  I & D Left BKA   . Amputation Right 06/12/2012    Procedure: AMPUTATION  DIGIT;  Surgeon: Elam Dutch, MD;  Location: [facility] OR;  Service: Vascular;  Laterality: Right;  GREAT TOE  . Amputation Right 08/08/2012    Procedure: AMPUTATION BELOW KNEE;  Surgeon: Elam Dutch, MD;  Location: [facility] OR;  Service: Vascular;  Laterality: Right;  . Abdominal aortagram N/A 05/28/2011    Procedure: ABDOMINAL Maxcine Ham;  Surgeon: Elam Dutch, MD;  Location: [facility] CATH LAB;  Service: Cardiovascular;  Laterality: N/A;  . Abdominal aortagram N/A 06/02/2012    Procedure: ABDOMINAL Maxcine Ham;  Surgeon: Elam Dutch, MD;  Location: [facility] CATH LAB;  Service: Cardiovascular;  Laterality: N/A;  . Abdominal aortagram N/A 06/09/2012    Procedure: ABDOMINAL Maxcine Ham;  Surgeon: Elam Dutch, MD;  Location: [facility] CATH LAB;   Service: Cardiovascular;  Laterality: N/A;  . Insertion of dialysis catheter Right 04/19/2014    Procedure: INSERTION OF DIALYSIS CATHETER-INTERNAL JUGULAR;  Surgeon: Mal Misty, MD;  Location: [place];  Service: Vascular;  Laterality: Right;  . Av fistula placement Right 04/19/2014    Procedure: RIGHT ARM ARTERIOVENOUS (AV) FISTULA CREATION;  Surgeon: Mal Misty, MD;  Location: [place];  Service: Vascular;  Laterality: Right;  . Ligation of competing branches of arteriovenous fistula Right 06/19/2014    Procedure: Right Arm LIGATION OF COMPETING BRANCHES OF RADIOCEPHALIC ARTERIOVENOUS FISTULA;  Surgeon: Mal Misty, MD;  Location: [place];  Service: Vascular;  Laterality: Right;   Family History  Problem Relation Age of Onset  . Coronary artery disease Neg Hx   . Anesthesia problems Neg Hx   . Hypotension Neg Hx   . Malignant hyperthermia Neg Hx   . Pseudochol deficiency Neg Hx   . Hyperlipidemia Mother   . Hypertension Mother   . Cancer Mother   . Hyperlipidemia Father   . Hypertension Father   . Kidney disease Father   . Heart disease Sister   . Alzheimer's disease Sister   . Cancer Brother   . Other Sister    Social History  Substance Use Topics  . Smoking status: Former Smoker -- 1.00 packs/day for 30 years    Types: Cigarettes    Quit date: 04/28/1991  . Smokeless tobacco: Never Used  . Alcohol Use: Yes     Comment: 04/17/2014 "might have a glass of wine at Christmas"    Review of Systems  Unable to perform ROS: Acuity of condition    Allergies  Oxycodone  Home Medications   Prior to Admission medications   Medication Sig Start Date End Date Taking? Authorizing Provider  AMBULATORY NON FORMULARY MEDICATION One Touch Ultra 2 Test Strips Sig:  Check blood sugars Three times daily to keep blood sugars under control Dx: E11.9 09/09/14   [name], NP  AMBULATORY NON FORMULARY MEDICATION BD U/P Mini Pen [place] 31GX5MM Use three times daily as directed DX:  E11.9 09/09/14   [name], NP  amLODipine (NORVASC) 10 MG tablet Take 1 tablet (10 mg total) by mouth daily. 09/14/14   Florencia Reasons, MD  aspirin EC 81 MG tablet Take 1 tablet (81 mg total) by mouth every morning. 08/21/12   Lavon Paganini Angiulli, PA-C  B-D UF III MINI PEN NEEDLES 31G X 5 MM MISC USE 3 TIMES A DAY AS DIRECTED 09/09/14   [name], NP  calcium acetate (PHOSLO) 667 MG capsule Take 1 capsule (667 mg total) by mouth 3 (three) times daily with meals. 09/14/14   Florencia Reasons, MD  cefTAZidime 2 g in  dextrose 5 % 50 mL Inject 2 g into the vein every Monday, Wednesday, and Friday at 8 PM. 09/13/14   Florencia Reasons, MD  clopidogrel (PLAVIX) 75 MG tablet take 1 tablet by mouth once daily 02/11/14   Blanchie Serve, MD  famotidine (PEPCID) 20 MG tablet Take 1 tablet (20 mg total) by mouth at bedtime. Patient not taking: Reported on 09/08/2014 05/03/14   Ivan Anchors Love, PA-C  HYDROcodone-acetaminophen (NORCO/VICODIN) 5-325 MG per tablet Take 1 tablet by mouth every 12 hours as needed for pain 08/14/14   Estill Dooms, MD  insulin aspart (NOVOLOG) 100 UNIT/ML FlexPen Inject 5 Units into the skin 3 (three) times daily with meals. Inject 5 units three times a day with meals if blood sugar greater than 150 only, DX: 250.71 08/07/13   Blanchie Serve, MD  Insulin Detemir (LEVEMIR) 100 UNIT/ML Pen Inject 15 Units into the skin 2 (two) times daily. 05/03/14   Bary Leriche, PA-C  Misc. Devices [facility]) [place] Patient needs new Wheelchair due to his being broken. 01/09/14   Blanchie Serve, MD  multivitamin (RENA-VIT) TABS tablet Take 1 tablet by mouth at bedtime. 05/03/14   Ivan Anchors Love, PA-C  ONETOUCH DELICA LANCETS 55D MISC Use to test blood sugar three times daily. Dx. E11.9 09/09/14   [name], NP  pravastatin (PRAVACHOL) 20 MG tablet Take 1 tablet (20 mg total) by mouth daily. 08/07/13   Blanchie Serve, MD  pregabalin (LYRICA) 75 MG capsule Take one tablet by mouth once daily for pains 08/30/14   Tiffany L Reed, DO   QUEtiapine (SEROQUEL) 25 MG tablet Take 1 tablet (25 mg total) by mouth daily. 05/03/14   Ivan Anchors Love, PA-C  senna-docusate (SENOKOT-S) 8.6-50 MG per tablet Take 2 tablets by mouth 2 (two) times daily. 04/25/14   Florencia Reasons, MD  tamsulosin (FLOMAX) 0.4 MG CAPS capsule Take 1 capsule (0.4 mg total) by mouth daily. 09/14/14   Florencia Reasons, MD   BP 132/61 mmHg  Pulse 73  Resp 14  SpO2 100%\  Physical Exam  Constitutional: No distress.  Elderly chronically ill-appearing male lying in stretcher in no acute distress  HENT:  Head: Normocephalic and atraumatic.  Eyes: Conjunctivae are normal. Pupils are equal, round, and reactive to light.  Neck: Normal range of motion. Neck supple.  Cardiovascular: Normal rate, regular rhythm and intact distal pulses.   Pulmonary/Chest: Effort normal and breath sounds normal.  Abdominal: Soft. Bowel sounds are normal.  Musculoskeletal: Normal range of motion.  Neurological: He is alert.  Alert, opens eyes to command, moving all extremities to command, responding yes/no to simple questions  Skin: Skin is warm and dry. He is not diaphoretic.  Nursing note and vitals reviewed.   ED Course  Procedures   Labs Review Labs Reviewed  CBC - Abnormal; Notable for the following:    RBC 3.56 (*)    Hemoglobin 11.1 (*)    HCT 35.1 (*)    RDW 16.7 (*)    All other components within normal limits  COMPREHENSIVE METABOLIC PANEL - Abnormal; Notable for the following:    Chloride 96 (*)    Glucose, Bld 146 (*)    BUN 35 (*)    Creatinine, Ser 5.71 (*)    Albumin 3.2 (*)    ALT 8 (*)    GFR calc non Af Amer 9 (*)    GFR calc Af Amer 10 (*)    All other components within normal limits  I-STAT  CHEM 8, ED - Abnormal; Notable for the following:    Chloride 97 (*)    BUN 38 (*)    Creatinine, Ser 5.20 (*)    Glucose, Bld 146 (*)    Calcium, Ion 1.05 (*)    Hemoglobin 12.9 (*)    HCT 38.0 (*)    All other components within normal limits  CULTURE, BLOOD (ROUTINE X 2)   CULTURE, BLOOD (ROUTINE X 2)  PROTIME-INR  APTT  DIFFERENTIAL  HEMOGLOBIN A1C  LIPID PANEL  I-STAT TROPOININ, ED  I-STAT CG4 LACTIC ACID, ED  CBG MONITORING, ED   Imaging Review Ct Head Wo Contrast  09/30/2014   CLINICAL DATA:  Altered mental status -code stroke.  EXAM: CT HEAD WITHOUT CONTRAST  TECHNIQUE: Contiguous axial images were obtained from the base of the skull through the vertex without intravenous contrast.  COMPARISON:  09/08/2014 and prior head CT  FINDINGS: Atrophy and mild chronic small-vessel white matter ischemic changes again noted.  No acute intracranial abnormalities are identified, including mass lesion or mass effect, hydrocephalus, extra-axial fluid collection, midline shift, hemorrhage, or acute infarction.  The visualized bony calvarium is unremarkable.  IMPRESSION: No evidence of acute intracranial abnormality.  Atrophy and chronic small-vessel white matter ischemic changes.  Critical Value/emergent results were called by telephone at the time of interpretation on 09/30/2014 at 9:09 pm to Dr. Armida Sans , who verbally acknowledged these results.   Electronically Signed   By: Margarette Canada M.D.   On: 09/30/2014 21:11   I have personally reviewed and evaluated these images and lab results as part of my medical decision-making.   EKG Interpretation   Date/Time:  Monday September 30 2014 21:09:00 EDT Ventricular Rate:  74 PR Interval:  161 QRS Duration: 84 QT Interval:  417 QTC Calculation: 463 R Axis:   14 Text Interpretation:  Sinus rhythm Atrial premature complexes Borderline T  abnormalities, lateral leads No significant change since last tracing  Confirmed by POLLINA  MD, [name] [phone]) on 09/30/2014 9:41:19 PM      MDM  Pt is a 74 y.o. male with pertinent PMHx HTN, HLD, DM, PAD, previous DVT, PAD s/p bilateral BKA, ICM, & ESRD (MWF) who presents w/ AMS, concerning for a stroke. Went to dialysis as scheduled today. Returned and was normal throughout  afternoon. Last seen normal at 8 PM. Wife found patient with decrease responsiveness. EMS called for CPR but patient responsive upon arrival. R facial droop and RUE weakness noted by EMS so Code stoke was initiated via EMS.  Patient was quickly assessed by myself and attending. Stroke team contacted and was immediately available. Thorough neurological exam was performed but non-focal.  CT scanner was made available and the patient was rapidly transported. Head CT head w/o contrast was performed and in summary showed  no acute intracranial abnormality. Labs performed with pertinent lab findings including lactic acid 1.99, creatinine 5.2 - known ESRD, INR 1.04, WBC 5.6, hemoglobin 11.1. Labs and imaging reviewed by myself and considered in medical decision making if ordered. Imaging interpreted by radiology  Neurology was available early in treatment course. Antiplatelets not administered. SBP's lower than baseline so no antihypertensive necessary but blood cultures drawn. Judicious IVF's given and SBP responded quickly from 90's to 130's Neurology recommending admission for limited TIA workup including echo and MRI brain.  Patient admitted to hospitalist for further evaluation and management of TIA. Pt and family agree with and understand the plan and have no further questions or concerns.  Pt  care was discussed with and followed by my attending, Dr. Zachery Conch, MD Pager [phone]  Clinical Impression: 1. Transient Ischemic Attack 2. Altered Mental Status 3. Right arm weakness  Mayer Camel, MD 09/30/14 2251  Orpah Greek, MD 09/30/14 [phone]

## 2014-09-30 NOTE — ED Notes (Signed)
Family at bedside. 

## 2014-09-30 NOTE — Consult Note (Signed)
Referring Physician: ED    Chief Complaint: code stroke, unresponsiveness, right arm and face weakness  HPI:                                                                                                                                         Aaron Long is an 74 y.o. male with a myriad of medical problems including HTN, hyperlipidemia, DM,  CAD s/p CABG, s/p right aCEA, chronic ischemic cardiomyopathy, ESRD on HD, s/p bilateral BKA, and prostate cancer, brought in by EMS due to acute onset of the above stated symptoms. At baseline, patient is bed bound. Patient reportedly had uneventful HD today, came back home around 5 pm, and few hours later his wife noted to that he was unresponsive sitting in his wheelchair. According to EMS, his right arm and face were weak and initially he was not answering questions but while being transported to the ED became responsive. NIHSS 0. CT brain was personally reviewed and showed no acute abnormality.   Date last known well: 9/19//31 Time last known well: 8 pm tPA Given: no, symptoms resolved NIHSS: 0 MRS: 5  Past Medical History  Diagnosis Date  . PAD (peripheral artery disease)     a. R CEA 2007. b. Hx L BKA in 2013. c. s/p LE angioplasty in 2014. d. Hx R BKA in 2014.  . Gangrene of toe     dry  . Neuromuscular disorder     diabetic neuropathy  . Hypertension   . Hyperlipidemia   . Peripheral neuropathy   . Constipation     takes Miralax daily  . Hemorrhoids   . Hx of colonic polyps   . Coronary artery disease     a. s/p NSTEMI/CABGx4 in 2007 - LIMA-LAD, SVG-optional diagonal, SVVG-OM, SVG-dRCA. b. Nuc 05/2011: nonischemic.  [name] BPH (benign prostatic hypertrophy)   . Abnormality of gait   . DVT (deep venous thrombosis)     a. Lower extremity DVT in 2013.  [name] Hypertrophy of prostate without urinary obstruction and other lower urinary tract symptoms (LUTS)   . Disorder of bone and cartilage, unspecified   . Lower limb amputation, below  knee   . Anemia   . Secondary hyperparathyroidism     Secondary Hyperpara- Thyroidism, Renal  . Ischemic cardiomyopathy     a. EF 40% in 2007. b. 51% by nuc 05/2011.  [name] Complication of anesthesia     "he gets delirious"  . Myocardial infarction 2005  . Type II diabetes mellitus   . Diabetic peripheral neuropathy   . ESRD on dialysis   . Kidney stones   . Prostate cancer 2010    Past Surgical History  Procedure Laterality Date  . Prostatectomy  2009  . Knee cartilage surgery Left 1964  . Coronary artery bypass graft  2005    CABG X4  . Carotid endarterectomy Right 2005  .  Cataract extraction w/ intraocular lens  implant, bilateral Bilateral 2011  . Colonoscopy    . Cardiac catheterization  05/28/11  . Amputation  06/14/2011    Procedure: AMPUTATION DIGIT;  Surgeon: Elam Dutch, MD;  Location: [facility] OR;  Service: Vascular;  Laterality: Left;  Amputation Left fifth toe  . Amputation  06/16/2011    Procedure: AMPUTATION BELOW KNEE;  Surgeon: Elam Dutch, MD;  Location: [place];  Service: Vascular;  Laterality: Left;  . I&d extremity  01/31/2012    Procedure: IRRIGATION AND DEBRIDEMENT EXTREMITY;  Surgeon: Elam Dutch, MD;  Location: [place];  Service: Vascular;  Laterality: Left;  I & D Left BKA   . Amputation Right 06/12/2012    Procedure: AMPUTATION DIGIT;  Surgeon: Elam Dutch, MD;  Location: [facility] OR;  Service: Vascular;  Laterality: Right;  GREAT TOE  . Amputation Right 08/08/2012    Procedure: AMPUTATION BELOW KNEE;  Surgeon: Elam Dutch, MD;  Location: [facility] OR;  Service: Vascular;  Laterality: Right;  . Abdominal aortagram N/A 05/28/2011    Procedure: ABDOMINAL Maxcine Ham;  Surgeon: Elam Dutch, MD;  Location: [facility] CATH LAB;  Service: Cardiovascular;  Laterality: N/A;  . Abdominal aortagram N/A 06/02/2012    Procedure: ABDOMINAL Maxcine Ham;  Surgeon: Elam Dutch, MD;  Location: [facility] CATH LAB;  Service: Cardiovascular;  Laterality: N/A;  . Abdominal aortagram N/A  06/09/2012    Procedure: ABDOMINAL Maxcine Ham;  Surgeon: Elam Dutch, MD;  Location: [facility] CATH LAB;  Service: Cardiovascular;  Laterality: N/A;  . Insertion of dialysis catheter Right 04/19/2014    Procedure: INSERTION OF DIALYSIS CATHETER-INTERNAL JUGULAR;  Surgeon: Mal Misty, MD;  Location: [place];  Service: Vascular;  Laterality: Right;  . Av fistula placement Right 04/19/2014    Procedure: RIGHT ARM ARTERIOVENOUS (AV) FISTULA CREATION;  Surgeon: Mal Misty, MD;  Location: [place];  Service: Vascular;  Laterality: Right;  . Ligation of competing branches of arteriovenous fistula Right 06/19/2014    Procedure: Right Arm LIGATION OF COMPETING BRANCHES OF RADIOCEPHALIC ARTERIOVENOUS FISTULA;  Surgeon: Mal Misty, MD;  Location: [place];  Service: Vascular;  Laterality: Right;    Family History  Problem Relation Age of Onset  . Coronary artery disease Neg Hx   . Anesthesia problems Neg Hx   . Hypotension Neg Hx   . Malignant hyperthermia Neg Hx   . Pseudochol deficiency Neg Hx   . Hyperlipidemia Mother   . Hypertension Mother   . Cancer Mother   . Hyperlipidemia Father   . Hypertension Father   . Kidney disease Father   . Heart disease Sister   . Alzheimer's disease Sister   . Cancer Brother   . Other Sister    Social History:  reports that he quit smoking about 23 years ago. His smoking use included Cigarettes. He has a 30 pack-year smoking history. He has never used smokeless tobacco. He reports that he drinks alcohol. He reports that he does not use illicit drugs. Family history: denies epilepsy, brain tumors, or brain aneurysm Allergies:  Allergies  Allergen Reactions  . Oxycodone Other (See Comments)    Hallucinations    Medications:  I have reviewed the patient's current medications.  ROS:                                                                                                                                        History obtained from chart review and the patient  General ROS: negative for - fever, night sweats, or weight loss Psychological ROS: negative for - behavioral disorder, hallucinations, memory difficulties, mood swings or suicidal ideation Ophthalmic ROS: negative for - blurry vision, double vision, eye pain or loss of vision ENT ROS: negative for - epistaxis, nasal discharge, oral lesions, sore throat, tinnitus or vertigo Allergy and Immunology ROS: negative for - hives or itchy/watery eyes Hematological and Lymphatic ROS: negative for - bleeding problems, bruising or swollen lymph nodes Endocrine ROS: negative for - galactorrhea, hair pattern changes, polydipsia/polyuria or temperature intolerance Respiratory ROS: negative for - cough, hemoptysis, shortness of breath or wheezing Cardiovascular ROS: negative for - chest pain, dyspnea on exertion, edema or irregular heartbeat Gastrointestinal ROS: negative for - abdominal pain, diarrhea, hematemesis, nausea/vomiting or stool incontinence Genito-Urinary ROS: negative for - hematuria Musculoskeletal ROS: negative for - joint swelling Neurological ROS: as noted in HPI Dermatological ROS: negative for rash and skin lesion changes   Physical exam:  Constitutional: well developed, pleasant male in no apparent distress. BP 94/60 P 82 R 17 ,afebrile. SpO2 94 %. Eyes: no jaundice or exophthalmos.  Head: normocephalic. Neck: supple, no bruits, no JVD. Cardiac: no murmurs. Lungs: clear. Abdomen: soft, no tender, no mass. Extremities: s/p bilateral BKA Skin: no rash  Neurologic Examination:                                                                                                      General: Mental Status: Alert, oriented, thought content appropriate.  Speech fluent without evidence of aphasia.  Able to follow 3 step commands without difficulty. Cranial  Nerves: II: Discs flat bilaterally; Visual fields grossly normal, pupils equal, round, reactive to light and accommodation III,IV, VI: ptosis not present, extra-ocular motions intact bilaterally V,VII: smile symmetric, facial light touch sensation normal bilaterally VIII: hearing normal bilaterally IX,X: uvula rises symmetrically XI: bilateral shoulder shrug XII: midline tongue extension without atrophy or fasciculations Motor: Right : Upper extremity   5/5    Left:     Upper extremity   5/5  Lower extremity   BKA    Lower extremity   BKA Sensory: Pinprick and light touch intact throughout, bilaterally Deep Tendon  Reflexes:  1+ upper extremities, s/p bilateral BKA Plantars: S/p bilateral BKA Cerebellar: normal finger-to-nose,  Can not test heel-to-shin due to bilateral BKA Gait:  Unable to test due to bilateral BKA    Results for orders placed or performed during the hospital encounter of 09/30/14 (from the past 48 hour(s))  Protime-INR     Status: None   Collection Time: 09/30/14  8:54 PM  Result Value Ref Range   Prothrombin Time 13.8 11.6 - 15.2 seconds   INR 1.04 0.00 - 1.49  APTT     Status: None   Collection Time: 09/30/14  8:54 PM  Result Value Ref Range   aPTT 28 24 - 37 seconds  CBC     Status: Abnormal   Collection Time: 09/30/14  8:54 PM  Result Value Ref Range   WBC 5.6 4.0 - 10.5 K/uL   RBC 3.56 (L) 4.22 - 5.81 MIL/uL   Hemoglobin 11.1 (L) 13.0 - 17.0 g/dL   HCT 35.1 (L) 39.0 - 52.0 %   MCV 98.6 78.0 - 100.0 fL   MCH 31.2 26.0 - 34.0 pg   MCHC 31.6 30.0 - 36.0 g/dL   RDW 16.7 (H) 11.5 - 15.5 %   Platelets 207 150 - 400 K/uL  Differential     Status: None   Collection Time: 09/30/14  8:54 PM  Result Value Ref Range   Neutrophils Relative % 44 %   Neutro Abs 2.5 1.7 - 7.7 K/uL   Lymphocytes Relative 47 %   Lymphs Abs 2.6 0.7 - 4.0 K/uL   Monocytes Relative 6 %   Monocytes Absolute 0.3 0.1 - 1.0 K/uL   Eosinophils Relative 2 %   Eosinophils Absolute  0.1 0.0 - 0.7 K/uL   Basophils Relative 1 %   Basophils Absolute 0.0 0.0 - 0.1 K/uL  I-stat troponin, ED (not at [facility])     Status: None   Collection Time: 09/30/14  9:02 PM  Result Value Ref Range   Troponin i, poc 0.04 0.00 - 0.08 ng/mL   Comment 3            Comment: Due to the release kinetics of cTnI, a negative result within the first hours of the onset of symptoms does not rule out myocardial infarction with certainty. If myocardial infarction is still suspected, repeat the test at appropriate intervals.   I-Stat Chem 8, ED  (not at [facility], [facility])     Status: Abnormal   Collection Time: 09/30/14  9:04 PM  Result Value Ref Range   Sodium 139 135 - 145 mmol/L   Potassium 4.3 3.5 - 5.1 mmol/L   Chloride 97 (L) 101 - 111 mmol/L   BUN 38 (H) 6 - 20 mg/dL   Creatinine, Ser 5.20 (H) 0.61 - 1.24 mg/dL   Glucose, Bld 146 (H) 65 - 99 mg/dL   Calcium, Ion 1.05 (L) 1.13 - 1.30 mmol/L   TCO2 27 0 - 100 mmol/L   Hemoglobin 12.9 (L) 13.0 - 17.0 g/dL   HCT 38.0 (L) 39.0 - 52.0 %   Ct Head Wo Contrast  09/30/2014   CLINICAL DATA:  Altered mental status -code stroke.  EXAM: CT HEAD WITHOUT CONTRAST  TECHNIQUE: Contiguous axial images were obtained from the base of the skull through the vertex without intravenous contrast.  COMPARISON:  09/08/2014 and prior head CT  FINDINGS: Atrophy and mild chronic small-vessel white matter ischemic changes again noted.  No acute intracranial abnormalities are identified, including mass lesion  or mass effect, hydrocephalus, extra-axial fluid collection, midline shift, hemorrhage, or acute infarction.  The visualized bony calvarium is unremarkable.  IMPRESSION: No evidence of acute intracranial abnormality.  Atrophy and chronic small-vessel white matter ischemic changes.  Critical Value/emergent results were called by telephone at the time of interpretation on 09/30/2014 at 9:09 pm to Dr. Armida Sans , who verbally acknowledged these results.   Electronically Signed    By: Margarette Canada M.D.   On: 09/30/2014 21:11     Assessment: 74 y.o. male with multiple risk factors for stroke, brought in with acute onset unresponsiveness, right face and arm weakness. NIHSS 0. CT brain without acute abnormality. Possible left brain TIA. Significant hypotension in the ED, had dialysis today. Admit to medicine. TIA work up. Stroke team to follow up tomorrow.    Stroke Risk Factors - age, HTN, hyperlipidemia, DM,  CAD, chronic ischemic cardiomyopathy, ESRD   Plan: 1. HgbA1c, fasting lipid panel 2. MRI, MRA  of the brain without contrast 3. Echocardiogram 4. Carotid dopplers 5. Prophylactic therapy-aspirin after passing swallowing eval 6. Risk factor modification 7. Telemetry monitoring 8. Frequent neuro checks 9. PT/OT SLP  Dorian Pod, MD [facility] [phone]  09/30/2014, 9:23 PM

## 2014-10-01 ENCOUNTER — Observation Stay (HOSPITAL_COMMUNITY): Payer: Medicare Other

## 2014-10-01 ENCOUNTER — Other Ambulatory Visit: Payer: Self-pay | Admitting: Internal Medicine

## 2014-10-01 ENCOUNTER — Ambulatory Visit: Payer: Medicare Other | Admitting: Nurse Practitioner

## 2014-10-01 DIAGNOSIS — N189 Chronic kidney disease, unspecified: Secondary | ICD-10-CM

## 2014-10-01 DIAGNOSIS — I6522 Occlusion and stenosis of left carotid artery: Secondary | ICD-10-CM

## 2014-10-01 DIAGNOSIS — Z992 Dependence on renal dialysis: Secondary | ICD-10-CM | POA: Diagnosis not present

## 2014-10-01 DIAGNOSIS — G459 Transient cerebral ischemic attack, unspecified: Secondary | ICD-10-CM | POA: Diagnosis not present

## 2014-10-01 DIAGNOSIS — I120 Hypertensive chronic kidney disease with stage 5 chronic kidney disease or end stage renal disease: Secondary | ICD-10-CM | POA: Diagnosis not present

## 2014-10-01 DIAGNOSIS — R404 Transient alteration of awareness: Secondary | ICD-10-CM

## 2014-10-01 DIAGNOSIS — D631 Anemia in chronic kidney disease: Secondary | ICD-10-CM | POA: Diagnosis not present

## 2014-10-01 DIAGNOSIS — I10 Essential (primary) hypertension: Secondary | ICD-10-CM | POA: Diagnosis not present

## 2014-10-01 DIAGNOSIS — E1140 Type 2 diabetes mellitus with diabetic neuropathy, unspecified: Secondary | ICD-10-CM | POA: Diagnosis not present

## 2014-10-01 DIAGNOSIS — N186 End stage renal disease: Secondary | ICD-10-CM | POA: Diagnosis not present

## 2014-10-01 LAB — GLUCOSE, CAPILLARY
GLUCOSE-CAPILLARY: 124 mg/dL — AB (ref 65–99)
Glucose-Capillary: 261 mg/dL — ABNORMAL HIGH (ref 65–99)
Glucose-Capillary: 135 mg/dL — ABNORMAL HIGH (ref 65–99)
Glucose-Capillary: 158 mg/dL — ABNORMAL HIGH (ref 65–99)

## 2014-10-01 LAB — LIPID PANEL
CHOL/HDL RATIO: 6.1 ratio
Cholesterol: 178 mg/dL (ref 0–200)
HDL: 29 mg/dL — AB (ref >40)
LDL Cholesterol: 117 mg/dL — ABNORMAL HIGH (ref 0–99)
TRIGLYCERIDES: 158 mg/dL — AB (ref <150)
VLDL: 32 mg/dL (ref 0–40)

## 2014-10-01 MED ORDER — PRAVASTATIN SODIUM 40 MG PO TABS: 40.0000 mg | ORAL_TABLET | Freq: Every day | ORAL | Status: DC

## 2014-10-01 MED ORDER — DOXERCALCIFEROL 4 MCG/2ML IV SOLN
5.0000 ug | INTRAVENOUS | Status: DC
  Administered 2014-10-02: 5 ug via INTRAVENOUS
  Filled 2014-10-01 (×2): qty 4

## 2014-10-01 MED ORDER — SODIUM CHLORIDE 0.9 % IV SOLN
62.5000 mg | INTRAVENOUS | Status: DC
  Administered 2014-10-02: 62.5 mg via INTRAVENOUS
  Filled 2014-10-01 (×3): qty 5

## 2014-10-01 MED ORDER — PRAVASTATIN SODIUM 40 MG PO TABS
40.0000 mg | ORAL_TABLET | Freq: Every day | ORAL | Status: DC
  Administered 2014-10-02 – 2014-10-03 (×2): 40 mg via ORAL
  Filled 2014-10-01 (×2): qty 1

## 2014-10-01 NOTE — Progress Notes (Signed)
OT Cancellation Note  Patient Details Name: Aaron [name] MRN: [id number] DOB: 02-Oct-1940   Cancelled Treatment:    Reason Eval/Treat Not Completed: Patient at procedure or test/ unavailable.   Hortencia Pilar 10/01/2014, 2:09 PM

## 2014-10-01 NOTE — Progress Notes (Signed)
  Echocardiogram 2D Echocardiogram has been performed.  Diamond Nickel 10/01/2014, 9:56 AM

## 2014-10-01 NOTE — Progress Notes (Signed)
Pt transferred to unit from ED via RN x 1. Pt alert and oriented upon arrival. No complaints of pain or discomfort. No signs or symptoms of acute distress. Pt connected to telemetry and central monitoring notified. Pt oriented to unit, as well as unit procedures. Pt now resting in bed at lowest position, bed alarm on, call light in reach. Will continue to monitor. Fortino Sic, RN, BSN 10/02/2014 1:35 AM

## 2014-10-01 NOTE — Procedures (Signed)
ELECTROENCEPHALOGRAM REPORT   Patient: Aaron [name]      Room #: 8M-21 Age: 74 y.o.        Sex: male Referring Physician: Dr Verlon Au Report Date:  10/01/2014        Interpreting Physician: Hulen Luster  History: [name] is an 74 y.o. male with a period of unresponsiveness with associated right sided weakness  Medications:  Scheduled: .  stroke: mapping our early stages of recovery book   Does not apply Once  . amLODipine  10 mg Oral Daily  . aspirin EC  81 mg Oral q morning - 10a  . calcium acetate  667 mg Oral TID WC  . [START ON 10/02/2014] ceftAZIDime (FORTAZ) IVPB 2 gram/50 mL D5W (Pyxis)  2 g Intravenous Q M,W,F-HD  . clopidogrel  75 mg Oral Daily  . enoxaparin (LOVENOX) injection  30 mg Subcutaneous Daily  . famotidine  20 mg Oral QHS  . insulin aspart  0-9 Units Subcutaneous TID WC  . insulin detemir  15 Units Subcutaneous BID  . multivitamin  1 tablet Oral QHS  . pravastatin  20 mg Oral Daily  . pregabalin  75 mg Oral Daily  . QUEtiapine  25 mg Oral QHS  . senna-docusate  2 tablet Oral BID  . sodium chloride  3 mL Intravenous Q12H  . tamsulosin  0.4 mg Oral Daily    Conditions of Recording:  This is a 16 channel EEG carried out with the patient in the drowsy and asleep state.  Description:  The patient is asleep for a majority of the recording with a noted mix of theta and delta activity in the background. Near the end of the recording there is a notable posterior alpha rhythm with arousal. No focal slowing or epileptiform activity noted.   Hyperventilation was not performed. Intermittent photic stimulation was not performed.  IMPRESSION: Normal electroencephalogram, awake, asleep. There are no focal lateralizing or epileptiform features.   Jim Like, DO [facility] [phone]  If 7pm- 7am, please page neurology on call as listed in [place]. 10/01/2014, 3:23 PM

## 2014-10-01 NOTE — Progress Notes (Signed)
EEG completed, results pending. 

## 2014-10-01 NOTE — Progress Notes (Signed)
STROKE TEAM PROGRESS NOTE  HPI Aaron [name] is an 74 y.o. male with a myriad of medical problems including HTN, hyperlipidemia, DM, CAD s/p CABG, s/p right aCEA, chronic ischemic cardiomyopathy, ESRD on HD, s/p bilateral BKA, and prostate cancer, brought in by EMS due to acute onset of unresponsiveness, right arm and face weakness. At baseline, patient is bed bound. Patient reportedly had uneventful HD today, came back home around 5 pm, and few hours later his wife noted to that he was unresponsive sitting in his wheelchair. According to EMS, his right arm and face were weak and initially he was not answering questions but while being transported to the ED became responsive. NIHSS 0. CT brain was personally reviewed and showed no acute abnormality.  Date last known well: 9/19//31 Time last known well: 8 pm tPA Given: no, symptoms resolved NIHSS: 0 MRS: 5     SUBJECTIVE (INTERVAL HISTORY) Multiple family members present. The family reported that the patient was found unresponsive. When he came around he was not able to speak or move initially. The patient does not remember the episode. He had a somewhat similar episode in April of this year when he had a longer period of loss of consciousness isn't clear whether he had workup at that time.    OBJECTIVE Temp:  [98 F (36.7 C)-98.6 F (37 C)] 98 F (36.7 C) (09/20 0530) Pulse Rate:  [70-80] 78 (09/20 1200) Cardiac Rhythm:  [-] Normal sinus rhythm (09/20 0800) Resp:  [12-16] 16 (09/20 1200) BP: (96-158)/(51-84) 139/74 mmHg (09/20 1200) SpO2:  [94 %-100 %] 100 % (09/20 1200)  CBC:   Recent Labs Lab 09/30/14 2054 09/30/14 2104  WBC 5.6  --   NEUTROABS 2.5  --   HGB 11.1* 12.9*  HCT 35.1* 38.0*  MCV 98.6  --   PLT 207  --     Basic Metabolic Panel:   Recent Labs Lab 09/30/14 2054 09/30/14 2104  NA 138 139  K 4.4 4.3  CL 96* 97*  CO2 30  --   GLUCOSE 146* 146*  BUN 35* 38*  CREATININE 5.71* 5.20*  CALCIUM 8.9   --     Lipid Panel:     Component Value Date/Time   CHOL 178 10/01/2014 0148   CHOL 147 04/02/2014 1540   TRIG 158* 10/01/2014 0148   HDL 29* 10/01/2014 0148   HDL 36* 04/02/2014 1540   CHOLHDL 6.1 10/01/2014 0148   CHOLHDL 4.1 04/02/2014 1540   VLDL 32 10/01/2014 0148   LDLCALC 117* 10/01/2014 0148   LDLCALC 97 04/02/2014 1540   HgbA1c:  Lab Results  Component Value Date   HGBA1C 7.2* 04/02/2014   Urine Drug Screen:     Component Value Date/Time   LABOPIA NONE DETECTED 09/08/2014 2013   COCAINSCRNUR NONE DETECTED 09/08/2014 2013   LABBENZ NONE DETECTED 09/08/2014 2013   AMPHETMU NONE DETECTED 09/08/2014 2013   THCU NONE DETECTED 09/08/2014 2013   LABBARB NONE DETECTED 09/08/2014 2013      IMAGING  Ct Head Wo Contrast 09/30/2014    No evidence of acute intracranial abnormality.  Atrophy and chronic small-vessel white matter ischemic changes.     Mr Aaron [name] Head Wo Contrast 09/30/2014    MRI HEAD 1. No acute intracranial infarct or other abnormality identified.  2. Generalized cerebral atrophy with mild chronic small vessel ischemic disease.   MRA HEAD  1. No large or proximal arterial branch occlusion within the intracranial circulation.  2. Attenuated flow within  the diminutive right vertebral artery. Dominant left vertebral artery widely patent to the vertebrobasilar junction.  3. Short-segment moderate to severe nonocclusive stenosis within the left P1 segment.  4. Atheromatous irregularity with multifocal moderate narrowing of the distal cavernous/ supraclinoid ICAs bilaterally.        PHYSICAL EXAM Obese middle-aged African-American gentleman not in distress. Bilateral below-knee amputation. . Afebrile. Head is nontraumatic. Neck is supple without bruit.    Cardiac exam soft ejection murmur or gallop. Lungs are clear to auscultation. Distal pulses are not felt in lower extremities due to amputation.  Neurological Exam ;  Awake  Alert oriented x 3. Normal  speech and language.eye movements full without nystagmus.fundi were not visualized. Vision acuity and fields appear normal. Hearing is normal. Palatal movements are normal. Face symmetric. Tongue midline. Normal strength, tone, reflexes and coordination. Limited lower extremity exam due to bilateral below-knee amputation. Normal sensation. Gait deferred.     ASSESSMENT/PLAN [name] is a 74 y.o. male with history of hypertension, hyperlipidemia, diabetes mellitus, coronary artery disease with previous coronary artery bypass graft surgery, previous right carotid endarterectomy, cardiomyopathy, end-stage renal disease on dialysis, status post bilateral BKA's, and prostate cancer presenting with unresponsiveness followed by right arm and right facial weakness. He did not receive IV t-PA due to resolution of deficits.  Possible TIA: Dominant   Resultant resolution of deficits  MRI  no acute intracranial infarct  MRA  no large or proximal arterial branch occlusions  Carotid Doppler  pending  2D Echo  EF 50-55%. No cardiac source of emboli identified.  LDL 117  HgbA1c pending  VTE prophylaxis - Lovenox Diet renal/carb modified with fluid restriction Diet-HS Snack?: Nothing; Room service appropriate?: Yes; Fluid consistency:: Thin  aspirin 81 mg orally every day and clopidogrel 75 mg orally every day prior to admission, now on aspirin 81 mg orally every day and clopidogrel 75 mg orally every day  Patient counseled to be compliant with his antithrombotic medications  Ongoing aggressive stroke risk factor management  Therapy recommendations: Pending  Disposition: Pending  Hypertension  Stable   Hyperlipidemia  Home meds: Pravachol 20 mg daily resumed in hospital  LDL 117, goal < 70  Increase Pravachol to 40 mg daily.  Continue statin at discharge  Diabetes  HgbA1c pending, goal < 7.0  Controlled  Other Stroke Risk Factors  Advanced age  Cigarette  smoker, quit smoking 23 years ago  ETOH use  Obesity, There is no weight on file to calculate BMI.   Coronary artery disease   Other Active Problems  End-stage renal disease - on dialysis  Mild anemia  PLAN  Check EEG - pending  Hospital day #   Aaron Bussing PA-C [facility] Pager [phone] 10/01/2014, 4:37 PM  I have personally examined this patient, reviewed notes, independently viewed imaging studies, participated in medical decision making and plan of care. I have made any additions or clarifications directly to the above note. Agree with note above. He presented with brief episode of altered consciousness followed by confusion but without focal lateralizing deficits. Differential diagnoses includes syncopal event versus seizure. He remains at risk for recurrent events, neurological worsening, stroke, TIA needs ongoing evaluation and this factor management. I had a long discussion at the bedside with the patient, wife and multiple family members and answered questions. Plan check EEG  Antony Contras, MD Medical Director [place] Pager: [phone] 10/01/2014 5:58 PM    To contact Stroke Continuity provider, please  refer to [email or web address] After hours, contact General Neurology

## 2014-10-01 NOTE — Evaluation (Signed)
Physical Therapy Evaluation Patient Details Name: Aaron [name] MRN: [id number] DOB: 03/26/1940 Today's Date: 10/01/2014   History of Present Illness  Pt is a 74 y/o male with PMH of HTN, HLD, DM II, CAD s/p CABG, chronic ischemic cardiomyopathy, s/p bilateral BKA, diabetic neuropathy, ESRD on hemodialysis.  Pt c/o AMS, R arm and R facial weakness.  No acute abnormalities revealed by CT or MRI.  Clinical Impression  Pt presents with deficits indicated below.  Pt able to perform bed mobility with [place] for trunk stability and was able to perform lateral transfer bed to/from wheel chair with increased time and bed height adjustments.  Pt requires further PT services to improve safe functional mobility.  PT recommends HHPT to assess safety at home following d/c from acute care.   Follow Up Recommendations Supervision - Intermittent;Home health PT    Equipment Recommendations  None recommended by PT    Recommendations for Other Services OT consult     Precautions / Restrictions Precautions Precautions: Fall Precaution Comments: Bil BKA Other Brace/Splint: has bilat prostheses (at home) Restrictions Weight Bearing Restrictions: No      Mobility  Bed Mobility Overal bed mobility: Needs Assistance Bed Mobility: Rolling;Sidelying to Sit;Sit to Supine Rolling: Modified independent (Device/Increase time) (increased time) Sidelying to sit: Min assist (increased time, bed rail, assist for trunk support)   Sit to supine: Modified independent (Device/Increase time) (increased time, bed rails)   General bed mobility comments: notes hospital bed is harder to him to maneuver in than bed at home  Transfers Overall transfer level: Needs assistance Equipment used: None Transfers: Lateral/Scoot Transfers (bed to/from wheelchair)          Lateral/Scoot Transfers: Modified independent (Device/Increase time) (increased time, bed height adjusted as needed)    Ambulation/Gait              General Gait Details: pt non ambulatory  Stairs            Wheelchair Mobility    Modified Rankin (Stroke Patients Only)       Balance Overall balance assessment: Needs assistance   Sitting balance-Leahy Scale: Fair Sitting balance - Comments: able to maintain sitting EOB without UE support                                     Pertinent Vitals/Pain Pain Assessment: No/denies pain    Home Living Family/patient expects to be discharged to:: Private residence Living Arrangements: Spouse/significant other Available Help at Discharge: Family;Available PRN/intermittently Type of Home: Apartment Home Access: Level entry     Home Layout: One level Home Equipment: Wheelchair - manual;Shower seat (bilateral LE prosthetics)      Prior Function                 Hand Dominance   Dominant Hand: Left    Extremity/Trunk Assessment   Upper Extremity Assessment: Defer to OT evaluation (c/o R hand weakness and sensory impairments)             RLE Deficits / Details: BKA LLE Deficits / Details: BKA     Communication   Communication: No difficulties  Cognition Arousal/Alertness: Lethargic (says he hasn't slept well and keeps being woken up) Behavior During Therapy: Flat affect Overall Cognitive Status: Impaired/Different from baseline Area of Impairment: Orientation;Safety/judgement;Problem solving Orientation Level: Time (unsure of the month, able to state the year)   Memory: Decreased short-term memory (doesn't  remember how he got here)   Safety/Judgement: Decreased awareness of safety   Problem Solving: Slow processing;Difficulty sequencing;Requires verbal cues      General Comments      Exercises        Assessment/Plan    PT Assessment Patient needs continued PT services  PT Diagnosis Hemiplegia non-dominant side;Generalized weakness;Altered mental status   PT Problem List Decreased strength;Decreased activity  tolerance;Decreased mobility;Decreased cognition;Decreased safety awareness;Impaired sensation  PT Treatment Interventions DME instruction;Functional mobility training;Therapeutic activities;Therapeutic exercise;Patient/family education;Wheelchair mobility training   PT Goals (Current goals can be found in the Care Plan section) Acute Rehab PT Goals Patient Stated Goal: to get stronger PT Goal Formulation: With patient Time For Goal Achievement: 10/15/14 Potential to Achieve Goals: Good    Frequency Min 4X/week   Barriers to discharge        Co-evaluation     PT goals addressed during session: Mobility/safety with mobility         End of Session Equipment Utilized During Treatment: Gait belt Activity Tolerance: Patient tolerated treatment well Patient left: in bed;with call bell/phone within reach;with bed alarm set Nurse Communication: Mobility status         Time: 1040-1058 PT Time Calculation (min) (ACUTE ONLY): 18 min   Charges:   PT Evaluation $Initial PT Evaluation Tier I: 1 Procedure     PT G Codes:   PT G-Codes **NOT FOR INPATIENT CLASS** Functional Assessment Tool Used: clinical judgement Functional Limitation: Mobility: Walking and moving around Mobility: Walking and Moving Around Current Status (O0355): At least 20 percent but less than 40 percent impaired, limited or restricted Mobility: Walking and Moving Around Goal Status [phone]): At least 1 percent but less than 20 percent impaired, limited or restricted    Lorita Officer 10/01/2014, 1:00 PM  Lorita Officer, SPT

## 2014-10-01 NOTE — Consult Note (Signed)
Reason for Consult:ESRD, Anemia, HPTH Referring Physician: [name] is an 74 y.o. male.  HPI: 74 yr male with long term DM, PVD with R CEA, Bilat BKA, CAD with hx CABG, ischemic CM, DJD, neuropathy, BPH, kidney stones, and Prostate Ca admitted with TIA [name] brain on 9/19.  No documented hypotension.  Developed R arm, R face, weaknesses and poorly responsive. BS ok also.  Undergoing eval.  Returned to baseline shortly after episode with no intervention.     On HD at [facility] MWF.  Has recent prob with cath sepis and is on iv [place].  Has numbness R arm and hand on HD   Constitutional: hand only issue Eyes: limitaiton in vision , needs to see Dr. Marlise Eves, nose, mouth, throat, and face: upper plate Respiratory: negative Cardiovascular: negative Gastrointestinal: negative Genitourinary:negative Integument/breast: negative Hematologic/lymphatic: off ESA Musculoskeletal:negative Neurological: see HPI Endocrine: DM Allergic/Immunologic: Oxycodone   Dialyzes at [facility] on MWF since. Primary Nephrologist [place]. EDW 81.5 kg. HD Bath 2K, 2.25 Ca, Dialyzer 180NR, Heparin 8500. Access R FA AVF.  Past Medical History  Diagnosis Date  . PAD (peripheral artery disease)     a. R CEA 2007. b. Hx [name] BKA in 2013. c. s/p LE angioplasty in 2014. d. Hx R BKA in 2014.  . Gangrene of toe     dry  . Neuromuscular disorder     diabetic neuropathy  . Hypertension   . Hyperlipidemia   . Peripheral neuropathy   . Constipation     takes Miralax daily  . Hemorrhoids   . Hx of colonic polyps   . Coronary artery disease     a. s/p NSTEMI/CABGx4 in 2007 - LIMA-LAD, SVG-optional diagonal, SVVG-OM, SVG-dRCA. b. Nuc 05/2011: nonischemic.  [name] BPH (benign prostatic hypertrophy)   . Abnormality of gait   . DVT (deep venous thrombosis)     a. Lower extremity DVT in 2013.  [name] Hypertrophy of prostate without urinary obstruction and other lower urinary tract symptoms (LUTS)   . Disorder of bone and  cartilage, unspecified   . Lower limb amputation, below knee   . Anemia   . Secondary hyperparathyroidism     Secondary Hyperpara- Thyroidism, Renal  . Ischemic cardiomyopathy     a. EF 40% in 2007. b. 51% by nuc 05/2011.  [name] Complication of anesthesia     "he gets delirious"  . Myocardial infarction 2005  . Type II diabetes mellitus   . Diabetic peripheral neuropathy   . ESRD on dialysis   . Kidney stones   . Prostate cancer 2010    Past Surgical History  Procedure Laterality Date  . Prostatectomy  2009  . Knee cartilage surgery Left 1964  . Coronary artery bypass graft  2005    CABG X4  . Carotid endarterectomy Right 2005  . Cataract extraction w/ intraocular lens  implant, bilateral Bilateral 2011  . Colonoscopy    . Cardiac catheterization  05/28/11  . Amputation  06/14/2011    Procedure: AMPUTATION DIGIT;  Surgeon: Elam Dutch, MD;  Location[facility];  Service: Vascular;  Laterality: Left;  Amputation Left fifth toe  . Amputation  06/16/2011    Procedure: AMPUTATION BELOW KNEE;  Surgeon: Elam Dutch, MD;  Location: [place];  Service: Vascular;  Laterality: Left;  . I&d extremity  01/31/2012    Procedure: IRRIGATION AND DEBRIDEMENT EXTREMITY;  Surgeon: Elam Dutch, MD;  Location: [place];  Service: Vascular;  Laterality: Left;  I & D Left BKA   . Amputation Right 06/12/2012    Procedure: AMPUTATION DIGIT;  Surgeon: Elam Dutch, MD;  Location: [facility] OR;  Service: Vascular;  Laterality: Right;  GREAT TOE  . Amputation Right 08/08/2012    Procedure: AMPUTATION BELOW KNEE;  Surgeon: Elam Dutch, MD;  Location: [facility] OR;  Service: Vascular;  Laterality: Right;  . Abdominal aortagram N/A 05/28/2011    Procedure: ABDOMINAL Maxcine Ham;  Surgeon: Elam Dutch, MD;  Location: [facility] CATH LAB;  Service: Cardiovascular;  Laterality: N/A;  . Abdominal aortagram N/A 06/02/2012    Procedure: ABDOMINAL Maxcine Ham;  Surgeon: Elam Dutch, MD;  Location: [facility] CATH LAB;  Service:  Cardiovascular;  Laterality: N/A;  . Abdominal aortagram N/A 06/09/2012    Procedure: ABDOMINAL Maxcine Ham;  Surgeon: Elam Dutch, MD;  Location: [facility] CATH LAB;  Service: Cardiovascular;  Laterality: N/A;  . Insertion of dialysis catheter Right 04/19/2014    Procedure: INSERTION OF DIALYSIS CATHETER-INTERNAL JUGULAR;  Surgeon: Mal Misty, MD;  Location: [place];  Service: Vascular;  Laterality: Right;  . Av fistula placement Right 04/19/2014    Procedure: RIGHT ARM ARTERIOVENOUS (AV) FISTULA CREATION;  Surgeon: Mal Misty, MD;  Location: [place];  Service: Vascular;  Laterality: Right;  . Ligation of competing branches of arteriovenous fistula Right 06/19/2014    Procedure: Right Arm LIGATION OF COMPETING BRANCHES OF RADIOCEPHALIC ARTERIOVENOUS FISTULA;  Surgeon: Mal Misty, MD;  Location: [place];  Service: Vascular;  Laterality: Right;    Family History  Problem Relation Age of Onset  . Coronary artery disease Neg Hx   . Anesthesia problems Neg Hx   . Hypotension Neg Hx   . Malignant hyperthermia Neg Hx   . Pseudochol deficiency Neg Hx   . Hyperlipidemia Mother   . Hypertension Mother   . Cancer Mother   . Hyperlipidemia Father   . Hypertension Father   . Kidney disease Father   . Heart disease Sister   . Alzheimer's disease Sister   . Cancer Brother   . Other Sister     Social History:  reports that he quit smoking about 23 years ago. His smoking use included Cigarettes. He has a 30 pack-year smoking history. He has never used smokeless tobacco. He reports that he drinks alcohol. He reports that he does not use illicit drugs.  Allergies:  Allergies  Allergen Reactions  . Oxycodone Other (See Comments)    Hallucinations    Medications:  I have reviewed the patient's current medications. Prior to Admission:  Prescriptions prior to admission  Medication Sig Dispense Refill Last Dose  . AMBULATORY NON FORMULARY MEDICATION One Touch Ultra 2 Test Strips Sig:  Check blood  sugars Three times daily to keep blood sugars under control Dx: E11.9 100 strip 11   . AMBULATORY NON FORMULARY MEDICATION BD U/P Mini Pen [place] 31GX5MM Use three times daily as directed DX: E11.9 100 each 11   . amLODipine (NORVASC) 10 MG tablet Take 1 tablet (10 mg total) by mouth daily. 30 tablet 1   . aspirin EC 81 MG tablet Take 1 tablet (81 mg total) by mouth every morning.   09/08/2014 at Unknown time  . B-D UF III MINI PEN NEEDLES 31G X 5 MM MISC USE 3 TIMES A DAY AS DIRECTED 100 each 11   . calcium acetate (PHOSLO) 667 MG capsule Take 1 capsule (667 mg total) by mouth 3 (three) times daily with meals. 90 capsule 1   .  cefTAZidime 2 g in dextrose 5 % 50 mL Inject 2 g into the vein every Monday, Wednesday, and Friday at 8 PM. 2 g 0   . clopidogrel (PLAVIX) 75 MG tablet take 1 tablet by mouth once daily 90 tablet 1 09/08/2014 at Unknown time  . famotidine (PEPCID) 20 MG tablet Take 1 tablet (20 mg total) by mouth at bedtime. (Patient not taking: Reported on 09/08/2014) 30 tablet 1 Not Taking at Unknown time  . HYDROcodone-acetaminophen (NORCO/VICODIN) 5-325 MG per tablet Take 1 tablet by mouth every 12 hours as needed for pain 60 tablet 0 09/08/2014 at Unknown time  . insulin aspart (NOVOLOG) 100 UNIT/ML FlexPen Inject 5 Units into the skin 3 (three) times daily with meals. Inject 5 units three times a day with meals if blood sugar greater than 150 only, DX: 250.71   09/08/2014 at Unknown time  . Insulin Detemir (LEVEMIR) 100 UNIT/ML Pen Inject 15 Units into the skin 2 (two) times daily. 15 mL 3 09/08/2014 at Unknown time  . Misc. Devices [facility]) [place] Patient needs new Wheelchair due to his being broken. 1 each 0 Taking  . multivitamin (RENA-VIT) TABS tablet Take 1 tablet by mouth at bedtime. 30 tablet 1 09/07/2014 at Unknown time  . ONETOUCH DELICA LANCETS 47M MISC Use to test blood sugar three times daily. Dx. E11.9 100 each 11   . pravastatin (PRAVACHOL) 20 MG tablet Take 1 tablet (20 mg  total) by mouth daily. 90 tablet 3 09/08/2014 at Unknown time  . pregabalin (LYRICA) 75 MG capsule Take one tablet by mouth once daily for pains 30 capsule 0 09/08/2014 at Unknown time  . QUEtiapine (SEROQUEL) 25 MG tablet Take 1 tablet (25 mg total) by mouth daily. 30 tablet 1 09/08/2014 at Unknown time  . senna-docusate (SENOKOT-S) 8.6-50 MG per tablet Take 2 tablets by mouth 2 (two) times daily. 60 tablet 0 09/08/2014 at Unknown time  . tamsulosin (FLOMAX) 0.4 MG CAPS capsule Take 1 capsule (0.4 mg total) by mouth daily. 30 capsule 3    , Hectorol 78mg iv q HD, ,   Venofer 549miv q wk  Results for orders placed or performed during the hospital encounter of 09/30/14 (from the past 48 hour(s))  Protime-INR     Status: None   Collection Time: 09/30/14  8:54 PM  Result Value Ref Range   Prothrombin Time 13.8 11.6 - 15.2 seconds   INR 1.04 0.00 - 1.49  APTT     Status: None   Collection Time: 09/30/14  8:54 PM  Result Value Ref Range   aPTT 28 24 - 37 seconds  CBC     Status: Abnormal   Collection Time: 09/30/14  8:54 PM  Result Value Ref Range   WBC 5.6 4.0 - 10.5 K/uL   RBC 3.56 ([name]) 4.22 - 5.81 MIL/uL   Hemoglobin 11.1 ([name]) 13.0 - 17.0 g/dL   HCT 35.1 ([name]) 39.0 - 52.0 %   MCV 98.6 78.0 - 100.0 fL   MCH 31.2 26.0 - 34.0 pg   MCHC 31.6 30.0 - 36.0 g/dL   RDW 16.7 (H) 11.5 - 15.5 %   Platelets 207 150 - 400 K/uL  Differential     Status: None   Collection Time: 09/30/14  8:54 PM  Result Value Ref Range   Neutrophils Relative % 44 %   Neutro Abs 2.5 1.7 - 7.7 K/uL   Lymphocytes Relative 47 %   Lymphs Abs 2.6 0.7 - 4.0 K/uL  Monocytes Relative 6 %   Monocytes Absolute 0.3 0.1 - 1.0 K/uL   Eosinophils Relative 2 %   Eosinophils Absolute 0.1 0.0 - 0.7 K/uL   Basophils Relative 1 %   Basophils Absolute 0.0 0.0 - 0.1 K/uL  Comprehensive metabolic panel     Status: Abnormal   Collection Time: 09/30/14  8:54 PM  Result Value Ref Range   Sodium 138 135 - 145 mmol/[name]   Potassium 4.4 3.5  - 5.1 mmol/[name]   Chloride 96 ([name]) 101 - 111 mmol/[name]   CO2 30 22 - 32 mmol/[name]   Glucose, Bld 146 (H) 65 - 99 mg/dL   BUN 35 (H) 6 - 20 mg/dL   Creatinine, Ser 5.71 (H) 0.61 - 1.24 mg/dL   Calcium 8.9 8.9 - 10.3 mg/dL   Total Protein 6.7 6.5 - 8.1 g/dL   Albumin 3.2 ([name]) 3.5 - 5.0 g/dL   AST 16 15 - 41 U/[name]   ALT 8 ([name]) 17 - 63 U/[name]   Alkaline Phosphatase 70 38 - 126 U/[name]   Total Bilirubin 0.6 0.3 - 1.2 mg/dL   GFR calc non Af Amer 9 ([name]) >60 mL/min   GFR calc Af Amer 10 ([name]) >60 mL/min    Comment: (NOTE) The eGFR has been calculated using the CKD EPI equation. This calculation has not been validated in all clinical situations. eGFR's persistently <60 mL/min signify possible Chronic Kidney Disease.    Anion gap 12 5 - 15  I-stat troponin, ED (not at [facility], [facility])     Status: None   Collection Time: 09/30/14  9:02 PM  Result Value Ref Range   Troponin i, poc 0.04 0.00 - 0.08 ng/mL   Comment 3            Comment: Due to the release kinetics of cTnI, a negative result within the first hours of the onset of symptoms does not rule out myocardial infarction with certainty. If myocardial infarction is still suspected, repeat the test at appropriate intervals.   I-Stat Chem 8, ED  (not at [facility], [facility])     Status: Abnormal   Collection Time: 09/30/14  9:04 PM  Result Value Ref Range   Sodium 139 135 - 145 mmol/[name]   Potassium 4.3 3.5 - 5.1 mmol/[name]   Chloride 97 ([name]) 101 - 111 mmol/[name]   BUN 38 (H) 6 - 20 mg/dL   Creatinine, Ser 5.20 (H) 0.61 - 1.24 mg/dL   Glucose, Bld 146 (H) 65 - 99 mg/dL   Calcium, Ion 1.05 ([name]) 1.13 - 1.30 mmol/[name]   TCO2 27 0 - 100 mmol/[name]   Hemoglobin 12.9 ([name]) 13.0 - 17.0 g/dL   HCT 38.0 ([name]) 39.0 - 52.0 %  I-Stat CG4 Lactic Acid, ED     Status: None   Collection Time: 09/30/14  9:47 PM  Result Value Ref Range   Lactic Acid, Venous 1.99 0.5 - 2.0 mmol/[name]  Lipid panel     Status: Abnormal   Collection Time: 10/01/14  1:48 AM  Result Value Ref Range   Cholesterol 178 0 - 200 mg/dL    Triglycerides 158 (H) <150 mg/dL   HDL 29 ([name]) >40 mg/dL   Total CHOL/HDL Ratio 6.1 RATIO   VLDL 32 0 - 40 mg/dL   LDL Cholesterol 117 (H) 0 - 99 mg/dL    Comment:        Total Cholesterol/HDL:CHD Risk Coronary Heart Disease Risk Table  Men   Women  1/2 Average Risk   3.4   3.3  Average Risk       5.0   4.4  2 X Average Risk   9.6   7.1  3 X Average Risk  23.4   11.0        Use the calculated Patient Ratio above and the CHD Risk Table to determine the patient's CHD Risk.        ATP III CLASSIFICATION (LDL):  <100     mg/dL   Optimal  100-129  mg/dL   Near or Above                    Optimal  130-159  mg/dL   Borderline  160-189  mg/dL   High  >190     mg/dL   Very High   Glucose, capillary     Status: Abnormal   Collection Time: 10/01/14  6:37 AM  Result Value Ref Range   Glucose-Capillary 261 (H) 65 - 99 mg/dL   Comment 1 Notify RN    Comment 2 Document in Chart   Glucose, capillary     Status: Abnormal   Collection Time: 10/01/14 11:13 AM  Result Value Ref Range   Glucose-Capillary 135 (H) 65 - 99 mg/dL  Glucose, capillary     Status: Abnormal   Collection Time: 10/01/14  4:26 PM  Result Value Ref Range   Glucose-Capillary 124 (H) 65 - 99 mg/dL    Ct Head Wo Contrast  09/30/2014   CLINICAL DATA:  Altered mental status -code stroke.  EXAM: CT HEAD WITHOUT CONTRAST  TECHNIQUE: Contiguous axial images were obtained from the base of the skull through the vertex without intravenous contrast.  COMPARISON:  09/08/2014 and prior head CT  FINDINGS: Atrophy and mild chronic small-vessel white matter ischemic changes again noted.  No acute intracranial abnormalities are identified, including mass lesion or mass effect, hydrocephalus, extra-axial fluid collection, midline shift, hemorrhage, or acute infarction.  The visualized bony calvarium is unremarkable.  IMPRESSION: No evidence of acute intracranial abnormality.  Atrophy and chronic small-vessel white matter  ischemic changes.  Critical Value/emergent results were called by telephone at the time of interpretation on 09/30/2014 at 9:09 pm to Dr. Armida Sans , who verbally acknowledged these results.   Electronically Signed   By: Margarette Canada M.D.   On: 09/30/2014 21:11   Mr [name] Head Wo Contrast  09/30/2014   CLINICAL DATA:  Initial evaluation for acute unresponsiveness, transient right-sided weakness.  EXAM: MRI HEAD WITHOUT CONTRAST  MRA HEAD WITHOUT CONTRAST  TECHNIQUE: Multiplanar, multiecho pulse sequences of the brain and surrounding structures were obtained without intravenous contrast. Angiographic images of the head were obtained using MRA technique without contrast.  COMPARISON:  Prior CT from earlier the same day.  FINDINGS: MRI HEAD FINDINGS  Diffuse prominence of the CSF containing spaces compatible with generalized cerebral atrophy. Mild T2/FLAIR hyperintensity within the periventricular white matter most likely related to chronic small vessel ischemic disease. Probable small remote lacunar infarct within the right basal ganglia.  No abnormal foci of restricted diffusion to suggest acute intracranial infarct. Gray-white matter differentiation maintained. Normal intravascular flow voids preserved. No acute or chronic intracranial hemorrhage.  No mass lesion, midline shift, or mass effect. Mild ventricular prominence related to global parenchymal volume loss present without hydrocephalus. No extra-axial fluid collection.  Craniocervical junction within normal limits. Incidental note made of a partially empty sella.  No acute abnormality about the  orbits. Sequela prior bilateral lens extraction noted.  Mild mucosal thickening within the ethmoidal air cells and right maxillary sinus. Paranasal sinuses are otherwise clear. No mastoid effusion. Inner ear structures grossly normal.  Bone marrow signal intensity within normal limits. No scalp soft tissue abnormality.  MRA HEAD FINDINGS  ANTERIOR CIRCULATION:  Study  mildly degraded by motion artifact.  Visualized distal cervical segments of the internal carotid arteries are widely patent with antegrade flow. Petrous segments are widely patent. Atheromatous irregularity with associated moderate narrowing present within the distal cavernous/ supraclinoid ICAs bilaterally. Left A1 segment hypoplastic but patent. Right A1 segment widely patent. Anterior communicating artery and anterior cerebral arteries within normal limits. M1 segments patent without stenosis or occlusion. MCA bifurcations normal. Distal MCA branches symmetric and well opacified.  POSTERIOR CIRCULATION:  Right vertebral artery is diminutive and attenuated to the level of the vertebrobasilar junction. Dominant left vertebral artery widely patent. Left posterior inferior cerebral artery is patent proximally. Right posterior inferior cerebral artery not well visualized. Basilar artery well opacified. Irregularity at the mid aspect of the basilar artery favored to be related to motion artifact. Superior cerebellar arteries well opacified. Both posterior cerebral arteries arise from the basilar artery. There is a short-segment moderate to severe stenosis within the left P1 segment. Posterior cerebral arteries otherwise well opacified.  IMPRESSION: MRI HEAD IMPRESSION:  1. No acute intracranial infarct or other abnormality identified. 2. Generalized cerebral atrophy with mild chronic small vessel ischemic disease.  MRA HEAD IMPRESSION:  1. No large or proximal arterial branch occlusion within the intracranial circulation. 2. Attenuated flow within the diminutive right vertebral artery. Dominant left vertebral artery widely patent to the vertebrobasilar junction. 3. Short-segment moderate to severe nonocclusive stenosis within the left P1 segment. 4. Atheromatous irregularity with multifocal moderate narrowing of the distal cavernous/ supraclinoid ICAs bilaterally.   Electronically Signed   By: Jeannine Boga M.D.    On: 09/30/2014 23:29   Mr Brain Wo Contrast  09/30/2014   CLINICAL DATA:  Initial evaluation for acute unresponsiveness, transient right-sided weakness.  EXAM: MRI HEAD WITHOUT CONTRAST  MRA HEAD WITHOUT CONTRAST  TECHNIQUE: Multiplanar, multiecho pulse sequences of the brain and surrounding structures were obtained without intravenous contrast. Angiographic images of the head were obtained using MRA technique without contrast.  COMPARISON:  Prior CT from earlier the same day.  FINDINGS: MRI HEAD FINDINGS  Diffuse prominence of the CSF containing spaces compatible with generalized cerebral atrophy. Mild T2/FLAIR hyperintensity within the periventricular white matter most likely related to chronic small vessel ischemic disease. Probable small remote lacunar infarct within the right basal ganglia.  No abnormal foci of restricted diffusion to suggest acute intracranial infarct. Gray-white matter differentiation maintained. Normal intravascular flow voids preserved. No acute or chronic intracranial hemorrhage.  No mass lesion, midline shift, or mass effect. Mild ventricular prominence related to global parenchymal volume loss present without hydrocephalus. No extra-axial fluid collection.  Craniocervical junction within normal limits. Incidental note made of a partially empty sella.  No acute abnormality about the orbits. Sequela prior bilateral lens extraction noted.  Mild mucosal thickening within the ethmoidal air cells and right maxillary sinus. Paranasal sinuses are otherwise clear. No mastoid effusion. Inner ear structures grossly normal.  Bone marrow signal intensity within normal limits. No scalp soft tissue abnormality.  MRA HEAD FINDINGS  ANTERIOR CIRCULATION:  Study mildly degraded by motion artifact.  Visualized distal cervical segments of the internal carotid arteries are widely patent with antegrade flow. Petrous segments are widely  patent. Atheromatous irregularity with associated moderate narrowing  present within the distal cavernous/ supraclinoid ICAs bilaterally. Left A1 segment hypoplastic but patent. Right A1 segment widely patent. Anterior communicating artery and anterior cerebral arteries within normal limits. M1 segments patent without stenosis or occlusion. MCA bifurcations normal. Distal MCA branches symmetric and well opacified.  POSTERIOR CIRCULATION:  Right vertebral artery is diminutive and attenuated to the level of the vertebrobasilar junction. Dominant left vertebral artery widely patent. Left posterior inferior cerebral artery is patent proximally. Right posterior inferior cerebral artery not well visualized. Basilar artery well opacified. Irregularity at the mid aspect of the basilar artery favored to be related to motion artifact. Superior cerebellar arteries well opacified. Both posterior cerebral arteries arise from the basilar artery. There is a short-segment moderate to severe stenosis within the left P1 segment. Posterior cerebral arteries otherwise well opacified.  IMPRESSION: MRI HEAD IMPRESSION:  1. No acute intracranial infarct or other abnormality identified. 2. Generalized cerebral atrophy with mild chronic small vessel ischemic disease.  MRA HEAD IMPRESSION:  1. No large or proximal arterial branch occlusion within the intracranial circulation. 2. Attenuated flow within the diminutive right vertebral artery. Dominant left vertebral artery widely patent to the vertebrobasilar junction. 3. Short-segment moderate to severe nonocclusive stenosis within the left P1 segment. 4. Atheromatous irregularity with multifocal moderate narrowing of the distal cavernous/ supraclinoid ICAs bilaterally.   Electronically Signed   By: Jeannine Boga M.D.   On: 09/30/2014 23:29    ROS Blood pressure 131/57, pulse 76, temperature 98.2 F (36.8 C), temperature source Oral, resp. rate 18, SpO2 100 %. Physical Exam Physical Examination: General appearance - alert, well appearing, and in no  distress, overweight and obese OX3 Mental status - alert, oriented to person, place, and time Eyes - DM retinopathy, EOMS intact Mouth - mucous memb moist, no upper teeth, poor lower Neck - adenopathy noted PCL Lymphatics - posterior cervical nodes Chest - clear to auscultation, no wheezes, rales or rhonchi, symmetric air entry, decreased air entry noted bilat Heart - S1 and S2 normal, S4 present, systolic murmur OR5/6 at 2nd left intercostal space Abdomen - obese, pos bs, liver down 3 cm, soft Musculoskeletal - atrophy hand ms, defect in RLA distal to fistula, Bilat BKA Extremities - as above,  AVF RFA, B&T but deep and arm edematous Skin - trophic changes in hands,  Moles, seb kerratoses  Assessment/Plan: 1 TIA  Preexisting CVD.  Resolved.  W/U underway 2 ESRD: will do HD 3 Hypertension: bp ok off Amlodipine, keep off, avoid low bp 4. Anemia of ESRD: off  ESA 5. Metabolic Bone Disease: cont Vit D 6 PVD   7 ? Steal syndrome R hand , eval on HD 8 CAD 9 Prostate Ca 10 ^ lipids 11 DM needs better  Control 12 GERD P HD , see orders, stop BP meds, follow Hb, DM control  DETERDING,Aaron [name] 10/01/2014, 6:56 PM

## 2014-10-01 NOTE — Progress Notes (Signed)
Aaron [name] FUX:[id number] DOB: [birth year] DOA: 09/30/2014 PCP: [name], NP  Brief narrative: 74 -year-old male Recent admission 8/28-90/3/16 HTN HLD TY2 DM with complication bilateral BKA, diabetic neuropathy CAD with ischemic cardiomyopathy-CABG performed 04/15/2005 right carotid endarterectomy at the same time ESRD on HD since 2015 Prostate Cancer  Admitted Q unresponsiveness right face and arm weakness with an IHSS of 0 CT of the head was without abnormality and it was felt by neurology that this was a left brain TIA but there was some significant hypertension noted in the ED which may of been contributing. Patient was admitted for further workup under the stroke pathway    Past medical history-As per Problem list Chart reviewed as below-   Consultants:  Neurology  Procedures:    Antibiotics:  none   Subjective     Objective    Interim History:   Telemetry:    Objective: Filed Vitals:   10/01/14 0130 10/01/14 0330 10/01/14 0530 10/01/14 0730  BP: 121/66 107/52 108/70 154/69  Pulse: 80 74 71 72  Temp: 98.4 F (36.9 C) 98.6 F (37 C) 98 F (36.7 C)   TempSrc: Oral Oral Oral   Resp: 16 16 16 16   SpO2: 96% 100% 97% 100%   No intake or output data in the 24 hours ending 10/01/14 0802  Exam:  General: eomi ncat Cardiovascular:  s1 s 2no m/r/g Respiratory: clear Abdomen:  Soft nt nd Skin no le edema Neuro   Data Reviewed: Basic Metabolic Panel:  Recent Labs Lab 09/30/14 2054 09/30/14 2104  NA 138 139  K 4.4 4.3  CL 96* 97*  CO2 30  --   GLUCOSE 146* 146*  BUN 35* 38*  CREATININE 5.71* 5.20*  CALCIUM 8.9  --    Liver Function Tests:  Recent Labs Lab 09/30/14 2054  AST 16  ALT 8*  ALKPHOS 70  BILITOT 0.6  PROT 6.7  ALBUMIN 3.2*   No results for input(s): LIPASE, AMYLASE in the last 168 hours. No results for input(s): AMMONIA in the last 168 hours. CBC:  Recent Labs Lab 09/30/14 2054 09/30/14 2104    WBC 5.6  --   NEUTROABS 2.5  --   HGB 11.1* 12.9*  HCT 35.1* 38.0*  MCV 98.6  --   PLT 207  --    Cardiac Enzymes: No results for input(s): CKTOTAL, CKMB, CKMBINDEX, TROPONINI in the last 168 hours. BNP: Invalid input(s): POCBNP CBG:  Recent Labs Lab 10/01/14 0637  GLUCAP 261*    No results found for this or any previous visit (from the past 240 hour(s)).   Studies:              All Imaging reviewed and is as per above notation   Scheduled Meds: .  stroke: mapping our early stages of recovery book   Does not apply Once  . amLODipine  10 mg Oral Daily  . aspirin EC  81 mg Oral q morning - 10a  . calcium acetate  667 mg Oral TID WC  . [START ON 10/02/2014] ceftAZIDime (FORTAZ) IVPB 2 gram/50 mL D5W (Pyxis)  2 g Intravenous Q M,W,F-HD  . clopidogrel  75 mg Oral Daily  . enoxaparin (LOVENOX) injection  30 mg Subcutaneous Daily  . famotidine  20 mg Oral QHS  . insulin aspart  0-9 Units Subcutaneous TID WC  . insulin detemir  15 Units Subcutaneous BID  . multivitamin  1 tablet Oral QHS  . pravastatin  20 mg  Oral Daily  . pregabalin  75 mg Oral Daily  . QUEtiapine  25 mg Oral QHS  . senna-docusate  2 tablet Oral BID  . sodium chloride  3 mL Intravenous Q12H  . tamsulosin  0.4 mg Oral Daily   Continuous Infusions:    Assessment/Plan:  Principal Problem:   TIA (transient ischemic attack) -Unclear if this is etiology or not -Episode appeared to occur in setting of dialysis and patient might have had orthostasis because of this -Unfortunately because of bilateral BKA symptoms difficult to get orthostatics -We will complete workup for TIA -He will dialyze tomorrow and if there are no further symptoms With dialysis then we can consider discharge as's may be volume related in terms of hypertension HTN (hypertension) Hold amlodipine 10 mg for now as per above reasoning-   S/P BKA (below knee amputation) -Stable at present time    Diabetic neuropathy, type II diabetes  mellitus -Continue Lyrica 5 daily blood sugars ranging from 124-->260 -Consider and continue sliding scale insulin as well as Levemir 15 units   Anemia in chronic renal disease -Hemoglobin is 12.9 so no current need for people nor Aranesp    ESRD on dialysis, Metabolic bone disease -I've contacted the renal service to see the patient in consult and arrange for dialysis a.m.  -Continue PhosLo   sepsis from recent infected Port-A-Cath/PermCath removed on 8/30 -growing gram-negative organism resistant to Cefzil and  -Should complete a total of 3 weeks of [place]  -Nephrology to comment     Verneita Griffes, MD  [facility] Pager [phone] 10/01/2014, 8:02 AM

## 2014-10-02 ENCOUNTER — Observation Stay (HOSPITAL_BASED_OUTPATIENT_CLINIC_OR_DEPARTMENT_OTHER): Payer: Medicare Other

## 2014-10-02 DIAGNOSIS — Z01810 Encounter for preprocedural cardiovascular examination: Secondary | ICD-10-CM | POA: Diagnosis not present

## 2014-10-02 DIAGNOSIS — D631 Anemia in chronic kidney disease: Secondary | ICD-10-CM | POA: Diagnosis not present

## 2014-10-02 DIAGNOSIS — N189 Chronic kidney disease, unspecified: Secondary | ICD-10-CM | POA: Diagnosis not present

## 2014-10-02 DIAGNOSIS — G458 Other transient cerebral ischemic attacks and related syndromes: Secondary | ICD-10-CM

## 2014-10-02 DIAGNOSIS — N186 End stage renal disease: Secondary | ICD-10-CM | POA: Diagnosis not present

## 2014-10-02 DIAGNOSIS — E1140 Type 2 diabetes mellitus with diabetic neuropathy, unspecified: Secondary | ICD-10-CM | POA: Diagnosis not present

## 2014-10-02 DIAGNOSIS — I10 Essential (primary) hypertension: Secondary | ICD-10-CM | POA: Diagnosis not present

## 2014-10-02 DIAGNOSIS — I120 Hypertensive chronic kidney disease with stage 5 chronic kidney disease or end stage renal disease: Secondary | ICD-10-CM | POA: Diagnosis not present

## 2014-10-02 DIAGNOSIS — G459 Transient cerebral ischemic attack, unspecified: Secondary | ICD-10-CM

## 2014-10-02 DIAGNOSIS — Z992 Dependence on renal dialysis: Secondary | ICD-10-CM | POA: Diagnosis not present

## 2014-10-02 DIAGNOSIS — T82898A Other specified complication of vascular prosthetic devices, implants and grafts, initial encounter: Secondary | ICD-10-CM | POA: Diagnosis not present

## 2014-10-02 LAB — RENAL FUNCTION PANEL
ANION GAP: 11 (ref 5–15)
Albumin: 2.9 g/dL — ABNORMAL LOW (ref 3.5–5.0)
BUN: 57 mg/dL — ABNORMAL HIGH (ref 6–20)
CALCIUM: 9.5 mg/dL (ref 8.9–10.3)
CHLORIDE: 101 mmol/L (ref 101–111)
CO2: 26 mmol/L (ref 22–32)
Creatinine, Ser: 7.41 mg/dL — ABNORMAL HIGH (ref 0.61–1.24)
GFR calc Af Amer: 7 mL/min — ABNORMAL LOW (ref >60)
GFR calc non Af Amer: 6 mL/min — ABNORMAL LOW (ref >60)
GLUCOSE: 108 mg/dL — AB (ref 65–99)
Phosphorus: 6.5 mg/dL — ABNORMAL HIGH (ref 2.5–4.6)
Potassium: 4.2 mmol/L (ref 3.5–5.1)
SODIUM: 138 mmol/L (ref 135–145)

## 2014-10-02 LAB — HEMOGLOBIN A1C
Hgb A1c MFr Bld: 10.2 % — ABNORMAL HIGH (ref 4.8–5.6)
Mean Plasma Glucose: 246 mg/dL

## 2014-10-02 LAB — GLUCOSE, CAPILLARY
GLUCOSE-CAPILLARY: 129 mg/dL — AB (ref 65–99)
GLUCOSE-CAPILLARY: 249 mg/dL — AB (ref 65–99)
GLUCOSE-CAPILLARY: 175 mg/dL — AB (ref 65–99)
Glucose-Capillary: 95 mg/dL (ref 65–99)

## 2014-10-02 LAB — CBC
HCT: 36.3 % — ABNORMAL LOW (ref 39.0–52.0)
HEMOGLOBIN: 11.6 g/dL — AB (ref 13.0–17.0)
MCH: 31.4 pg (ref 26.0–34.0)
MCHC: 32 g/dL (ref 30.0–36.0)
MCV: 98.1 fL (ref 78.0–100.0)
Platelets: 183 10*3/uL (ref 150–400)
RBC: 3.7 MIL/uL — ABNORMAL LOW (ref 4.22–5.81)
RDW: 16 % — ABNORMAL HIGH (ref 11.5–15.5)
WBC: 6 10*3/uL (ref 4.0–10.5)

## 2014-10-02 MED ORDER — HEPARIN SODIUM (PORCINE) 1000 UNIT/ML DIALYSIS: 1000.0000 [IU] | INTRAMUSCULAR | Status: DC | PRN

## 2014-10-02 MED ORDER — HYDROCODONE-ACETAMINOPHEN 5-325 MG PO TABS
ORAL_TABLET | ORAL | Status: AC
  Filled 2014-10-02: qty 1

## 2014-10-02 MED ORDER — HYDROCODONE-ACETAMINOPHEN 5-325 MG PO TABS
1.0000 | ORAL_TABLET | Freq: Once | ORAL | Status: AC
  Administered 2014-10-02: 1 via ORAL

## 2014-10-02 MED ORDER — LIDOCAINE HCL (PF) 1 % IJ SOLN: 5.0000 mL | INTRAMUSCULAR | Status: DC | PRN

## 2014-10-02 MED ORDER — SODIUM CHLORIDE 0.9 % IV SOLN: 100.0000 mL | INTRAVENOUS | Status: DC | PRN

## 2014-10-02 MED ORDER — ALTEPLASE 2 MG IJ SOLR
2.0000 mg | Freq: Once | INTRAMUSCULAR | Status: DC | PRN
  Filled 2014-10-02: qty 2

## 2014-10-02 MED ORDER — LIDOCAINE-PRILOCAINE 2.5-2.5 % EX CREA: 1.0000 "application " | TOPICAL_CREAM | CUTANEOUS | Status: DC | PRN

## 2014-10-02 MED ORDER — HEPARIN SODIUM (PORCINE) 1000 UNIT/ML DIALYSIS: 100.0000 [IU]/kg | INTRAMUSCULAR | Status: DC | PRN

## 2014-10-02 MED ORDER — PENTAFLUOROPROP-TETRAFLUOROETH EX AERO: 1.0000 "application " | INHALATION_SPRAY | CUTANEOUS | Status: DC | PRN

## 2014-10-02 MED ORDER — DOXERCALCIFEROL 4 MCG/2ML IV SOLN
INTRAVENOUS | Status: AC
  Filled 2014-10-02: qty 4

## 2014-10-02 NOTE — Progress Notes (Signed)
STROKE TEAM PROGRESS NOTE  HPI Aaron Long is an 74 y.o. male with a myriad of medical problems including HTN, hyperlipidemia, DM, CAD s/p CABG, s/p right aCEA, chronic ischemic cardiomyopathy, ESRD on HD, s/p bilateral BKA, and prostate cancer, brought in by EMS due to acute onset of unresponsiveness, right arm and face weakness. At baseline, patient is bed bound. Patient reportedly had uneventful HD today, came back home around 5 pm, and few hours later his wife noted to that he was unresponsive sitting in his wheelchair. According to EMS, his right arm and face were weak and initially he was not answering questions but while being transported to the ED became responsive. NIHSS 0. CT brain was personally reviewed and showed no acute abnormality.  Date last known well: 9/19//31 Time last known well: 8 pm tPA Given: no, symptoms resolved NIHSS: 0 MRS: 5     SUBJECTIVE (INTERVAL HISTORY) No family members present. Carotid ultrasound study shows greater than 80% left ICA stenosis and multiple right vertebral artery waveform. EEG study was normal    OBJECTIVE Temp:  [98.1 F (36.7 C)-98.6 F (37 C)] 98.2 F (36.8 C) (09/21 1400) Pulse Rate:  [70-85] 77 (09/21 1400) Cardiac Rhythm:  [-] Normal sinus rhythm (09/21 0725) Resp:  [12-18] 18 (09/21 1400) BP: (83-181)/(41-119) 116/49 mmHg (09/21 1400) SpO2:  [98 %-100 %] 99 % (09/21 1400) Weight:  [178 lb 2.1 oz (80.8 kg)-181 lb 7 oz (82.3 kg)] 178 lb 2.1 oz (80.8 kg) (09/21 1225)  CBC:   Recent Labs Lab 09/30/14 2054 09/30/14 2104 10/02/14 0730  WBC 5.6  --  6.0  NEUTROABS 2.5  --   --   HGB 11.1* 12.9* 11.6*  HCT 35.1* 38.0* 36.3*  MCV 98.6  --  98.1  PLT 207  --  660    Basic Metabolic Panel:   Recent Labs Lab 09/30/14 2054 09/30/14 2104 10/02/14 0730  NA 138 139 138  K 4.4 4.3 4.2  CL 96* 97* 101  CO2 30  --  26  GLUCOSE 146* 146* 108*  BUN 35* 38* 57*  CREATININE 5.71* 5.20* 7.41*  CALCIUM 8.9  --  9.5   PHOS  --   --  6.5*    Lipid Panel:     Component Value Date/Time   CHOL 178 10/01/2014 0148   CHOL 147 04/02/2014 1540   TRIG 158* 10/01/2014 0148   HDL 29* 10/01/2014 0148   HDL 36* 04/02/2014 1540   CHOLHDL 6.1 10/01/2014 0148   CHOLHDL 4.1 04/02/2014 1540   VLDL 32 10/01/2014 0148   LDLCALC 117* 10/01/2014 0148   LDLCALC 97 04/02/2014 1540   HgbA1c:  Lab Results  Component Value Date   HGBA1C 10.2* 10/01/2014   Urine Drug Screen:     Component Value Date/Time   LABOPIA NONE DETECTED 09/08/2014 2013   COCAINSCRNUR NONE DETECTED 09/08/2014 2013   LABBENZ NONE DETECTED 09/08/2014 2013   AMPHETMU NONE DETECTED 09/08/2014 2013   THCU NONE DETECTED 09/08/2014 2013   LABBARB NONE DETECTED 09/08/2014 2013      IMAGING  Ct Head Wo Contrast 09/30/2014    No evidence of acute intracranial abnormality.  Atrophy and chronic small-vessel white matter ischemic changes.     Mr Aaron Long Head Wo Contrast 09/30/2014    MRI HEAD 1. No acute intracranial infarct or other abnormality identified.  2. Generalized cerebral atrophy with mild chronic small vessel ischemic disease.   MRA HEAD  1. No large or proximal arterial  branch occlusion within the intracranial circulation.  2. Attenuated flow within the diminutive right vertebral artery. Dominant left vertebral artery widely patent to the vertebrobasilar junction.  3. Short-segment moderate to severe nonocclusive stenosis within the left P1 segment.  4. Atheromatous irregularity with multifocal moderate narrowing of the distal cavernous/ supraclinoid ICAs bilaterally.        PHYSICAL EXAM Obese middle-aged African-American gentleman not in distress. Bilateral below-knee amputation. . Afebrile. Head is nontraumatic. Neck is supple without bruit.    Cardiac exam soft ejection murmur or gallop. Lungs are clear to auscultation. Distal pulses are not felt in lower extremities due to amputation.  Neurological Exam ;  Awake  Alert  oriented x 3. Normal speech and language.eye movements full without nystagmus.fundi were not visualized. Vision acuity and fields appear normal. Hearing is normal. Palatal movements are normal. Face symmetric. Tongue midline. Normal strength, tone, reflexes and coordination. Limited lower extremity exam due to bilateral below-knee amputation. Normal sensation. Gait deferred.     ASSESSMENT/PLAN [name] is a 74 y.o. male with history of hypertension, hyperlipidemia, diabetes mellitus, coronary artery disease with previous coronary artery bypass graft surgery, previous right carotid endarterectomy, cardiomyopathy, end-stage renal disease on dialysis, status post bilateral BKA's, and prostate cancer presenting with unresponsiveness followed by right arm and right facial weakness. He did not receive IV t-PA due to resolution of deficits.  Possible TIA: Likely due to severe extracranial left carotid stenosis  Resultant resolution of deficits  MRI  no acute intracranial infarct  MRA  no large or proximal arterial branch occlusions  Carotid Doppler  pending  2D Echo  EF 50-55%. No cardiac source of emboli identified.  LDL 117  HgbA1c pending  VTE prophylaxis - Lovenox Diet renal/carb modified with fluid restriction Diet-HS Snack?: Nothing; Room service appropriate?: Yes; Fluid consistency:: Thin  aspirin 81 mg orally every day and clopidogrel 75 mg orally every day prior to admission, now on aspirin 81 mg orally every day and clopidogrel 75 mg orally every day  Patient counseled to be compliant with his antithrombotic medications  Ongoing aggressive stroke risk factor management  Therapy recommendations: Pending  Disposition: Pending  Hypertension  Stable   Hyperlipidemia  Home meds: Pravachol 20 mg daily resumed in hospital  LDL 117, goal < 70  Increase Pravachol to 40 mg daily.  Continue statin at discharge  Diabetes  HgbA1c pending, goal <  7.0  Controlled  Other Stroke Risk Factors  Advanced age  Cigarette smoker, quit smoking 23 years ago  ETOH use  Obesity, Body mass index is 27.09 kg/(m^2).   Coronary artery disease   Other Active Problems  End-stage renal disease - on dialysis  Mild anemia  PLAN  Recommend vascular surgery consult for elective left carotid revascularization.  Hospital day #        Antony Contras, MD Medical Director [place] Pager: [phone] 10/02/2014 4:56 PM    To contact Stroke Continuity provider, please refer to [email or web address] After hours, contact General Neurology

## 2014-10-02 NOTE — Consult Note (Signed)
CARDIOLOGY CONSULT NOTE       Patient ID: Aaron Long MRN: [id number] DOB/AGE: 75/27/1942 75 y.o.  Admit date: 09/30/2014 Referring Physician:  Kellie Simmering Primary Physician: [name], NP Primary Cardiologist:  Mchalhany/Kelly Reason for Consultation: Preop  Principal Problem:   TIA (transient ischemic attack) Active Problems:   HTN (hypertension)   S/P BKA (below knee amputation)   Diabetic neuropathy, type II diabetes mellitus   Anemia in chronic renal disease   ESRD on dialysis   HPI:  74 y.o. with history of CAD/NSTEMI 2007 s/p 4V CABG (LIMA-LAD, SVG-optional diagonal, SVVG-OM, SVG-dRCA), ICM EF 40% in 2007, CKD stage IV, DM, HTN, HLD, right carotid endarterectomy 2007, PAD (with history of gangrene s/p bilateral BKAs), anemia, obesity, prostate CA, DVT 2013 who presented to [facility] yesterday with complaints of chest pain. Last nuc stress test on 06/03/11 showed:There is no evidence of scar or ischemia. There is septal dyssynergy compatible with the history of CABG, EF 51%. He was admitted with syncope and found to have 78% LICA stenosis.  To have surgery by Dr Kellie Simmering next week.  Note had ligation of feeder vessels to RUE fistula in April of this year with no cardiac complications.  Activity limited by dialysis and bilateral BKA;s.  No chest pain.    ROS All other systems reviewed and negative except as noted above  Past Medical History  Diagnosis Date  . PAD (peripheral artery disease)     a. R CEA 2007. b. Hx L BKA in 2013. c. s/p LE angioplasty in 2014. d. Hx R BKA in 2014.  . Gangrene of toe     dry  . Neuromuscular disorder     diabetic neuropathy  . Hypertension   . Hyperlipidemia   . Peripheral neuropathy   . Constipation     takes Miralax daily  . Hemorrhoids   . Hx of colonic polyps   . Coronary artery disease     a. s/p NSTEMI/CABGx4 in 2007 - LIMA-LAD, SVG-optional diagonal, SVVG-OM, SVG-dRCA. b. Nuc 05/2011: nonischemic.  [name] BPH  (benign prostatic hypertrophy)   . Abnormality of gait   . DVT (deep venous thrombosis)     a. Lower extremity DVT in 2013.  [name] Hypertrophy of prostate without urinary obstruction and other lower urinary tract symptoms (LUTS)   . Disorder of bone and cartilage, unspecified   . Lower limb amputation, below knee   . Anemia   . Secondary hyperparathyroidism     Secondary Hyperpara- Thyroidism, Renal  . Ischemic cardiomyopathy     a. EF 40% in 2007. b. 51% by nuc 05/2011.  [name] Complication of anesthesia     "he gets delirious"  . Myocardial infarction 2005  . Type II diabetes mellitus   . Diabetic peripheral neuropathy   . ESRD on dialysis   . Kidney stones   . Prostate cancer 2010    Family History  Problem Relation Age of Onset  . Coronary artery disease Neg Hx   . Anesthesia problems Neg Hx   . Hypotension Neg Hx   . Malignant hyperthermia Neg Hx   . Pseudochol deficiency Neg Hx   . Hyperlipidemia Mother   . Hypertension Mother   . Cancer Mother   . Hyperlipidemia Father   . Hypertension Father   . Kidney disease Father   . Heart disease Sister   . Alzheimer's disease Sister   . Cancer Brother   . Other Sister     Social History  Social History  . Marital Status: Married    Spouse Name: N/A  . Number of Children: 3  . Years of Education: N/A   Occupational History  . Retired-[employer]    Social History Main Topics  . Smoking status: Former Smoker -- 1.00 packs/day for 30 years    Types: Cigarettes    Quit date: 04/28/1991  . Smokeless tobacco: Never Used  . Alcohol Use: Yes     Comment: 04/17/2014 "might have a glass of wine at Christmas"  . Drug Use: No  . Sexual Activity: No   Other Topics Concern  . Not on file   Social History Narrative    Past Surgical History  Procedure Laterality Date  . Prostatectomy  2009  . Knee cartilage surgery Left 1964  . Coronary artery bypass graft  2005    CABG X4  . Carotid endarterectomy Right 2005  .  Cataract extraction w/ intraocular lens  implant, bilateral Bilateral 2011  . Colonoscopy    . Cardiac catheterization  05/28/11  . Amputation  06/14/2011    Procedure: AMPUTATION DIGIT;  Surgeon: Elam Dutch, MD;  Location: [facility];  Service: Vascular;  Laterality: Left;  Amputation Left fifth toe  . Amputation  06/16/2011    Procedure: AMPUTATION BELOW KNEE;  Surgeon: Elam Dutch, MD;  Location: [place];  Service: Vascular;  Laterality: Left;  . I&d extremity  01/31/2012    Procedure: IRRIGATION AND DEBRIDEMENT EXTREMITY;  Surgeon: Elam Dutch, MD;  Location: [place];  Service: Vascular;  Laterality: Left;  I & D Left BKA   . Amputation Right 06/12/2012    Procedure: AMPUTATION DIGIT;  Surgeon: Elam Dutch, MD;  Location: [facility] OR;  Service: Vascular;  Laterality: Right;  GREAT TOE  . Amputation Right 08/08/2012    Procedure: AMPUTATION BELOW KNEE;  Surgeon: Elam Dutch, MD;  Location: [facility] OR;  Service: Vascular;  Laterality: Right;  . Abdominal aortagram N/A 05/28/2011    Procedure: ABDOMINAL Maxcine Ham;  Surgeon: Elam Dutch, MD;  Location: [facility] CATH LAB;  Service: Cardiovascular;  Laterality: N/A;  . Abdominal aortagram N/A 06/02/2012    Procedure: ABDOMINAL Maxcine Ham;  Surgeon: Elam Dutch, MD;  Location: [facility] CATH LAB;  Service: Cardiovascular;  Laterality: N/A;  . Abdominal aortagram N/A 06/09/2012    Procedure: ABDOMINAL Maxcine Ham;  Surgeon: Elam Dutch, MD;  Location: [facility] CATH LAB;  Service: Cardiovascular;  Laterality: N/A;  . Insertion of dialysis catheter Right 04/19/2014    Procedure: INSERTION OF DIALYSIS CATHETER-INTERNAL JUGULAR;  Surgeon: Mal Misty, MD;  Location: [place];  Service: Vascular;  Laterality: Right;  . Av fistula placement Right 04/19/2014    Procedure: RIGHT ARM ARTERIOVENOUS (AV) FISTULA CREATION;  Surgeon: Mal Misty, MD;  Location: [place];  Service: Vascular;  Laterality: Right;  . Ligation of competing branches of arteriovenous fistula Right  06/19/2014    Procedure: Right Arm LIGATION OF COMPETING BRANCHES OF RADIOCEPHALIC ARTERIOVENOUS FISTULA;  Surgeon: Mal Misty, MD;  Location: [place];  Service: Vascular;  Laterality: Right;     . aspirin EC  81 mg Oral q morning - 10a  . calcium acetate  667 mg Oral TID WC  . ceftAZIDime (FORTAZ) IVPB 2 gram/50 mL D5W (Pyxis)  2 g Intravenous Q M,W,F-HD  . clopidogrel  75 mg Oral Daily  . doxercalciferol  5 mcg Intravenous Q M,W,F-HD  . enoxaparin (LOVENOX) injection  30 mg Subcutaneous Daily  . famotidine  20 mg Oral QHS  . ferric gluconate (FERRLECIT/NULECIT) IV  62.5 mg Intravenous Q Wed-HD  . insulin aspart  0-9 Units Subcutaneous TID WC  . insulin detemir  15 Units Subcutaneous BID  . multivitamin  1 tablet Oral QHS  . pravastatin  40 mg Oral q1800  . pregabalin  75 mg Oral Daily  . QUEtiapine  25 mg Oral QHS  . senna-docusate  2 tablet Oral BID  . sodium chloride  3 mL Intravenous Q12H  . tamsulosin  0.4 mg Oral Daily      Physical Exam: Blood pressure 116/49, pulse 77, temperature 98.2 F (36.8 C), temperature source Oral, resp. rate 18, weight 80.8 kg (178 lb 2.1 oz), SpO2 99 %.   Affect appropriate Chronically ill black male  HEENT: normal Neck supple with no adenopathy JVP normal bilateral  bruits no thyromegaly previous RCEA done with CABG  Lungs clear with no wheezing and good diaphragmatic motion Heart:  S1/S2  SEM  murmur, no rub, gallop or click PMI normal Abdomen: benighn, BS positve, no tenderness, no AAA no bruit.  No HSM or HJR Distal pulses intact with no bruits No edema Neuro non-focal Skin warm and dry S/P bilateral BKA  Fistula RUE with thrill    Labs:     Lab Results  Component Value Date   WBC 6.0 10/02/2014   HGB 11.6* 10/02/2014   HCT 36.3* 10/02/2014   MCV 98.1 10/02/2014   PLT 183 10/02/2014    Recent Labs Lab 09/30/14 2054  10/02/14 0730  NA 138  < > 138  K 4.4  < > 4.2  CL 96*  < > 101  CO2 30  --  26  BUN 35*  < > 57*   CREATININE 5.71*  < > 7.41*  CALCIUM 8.9  --  9.5  PROT 6.7  --   --   BILITOT 0.6  --   --   ALKPHOS 70  --   --   ALT 8*  --   --   AST 16  --   --   GLUCOSE 146*  < > 108*  < > = values in this interval not displayed. Lab Results  Component Value Date   CKTOTAL 159 05/03/2011   TROPONINI 0.28* 04/17/2014    Lab Results  Component Value Date   CHOL 178 10/01/2014   CHOL 147 04/02/2014   CHOL 168 09/26/2013   Lab Results  Component Value Date   HDL 29* 10/01/2014   HDL 36* 04/02/2014   HDL 35* 09/26/2013   Lab Results  Component Value Date   LDLCALC 117* 10/01/2014   LDLCALC 97 04/02/2014   LDLCALC 101* 09/26/2013   Lab Results  Component Value Date   TRIG 158* 10/01/2014   TRIG 68 04/02/2014   TRIG 158* 09/26/2013   Lab Results  Component Value Date   CHOLHDL 6.1 10/01/2014   CHOLHDL 4.1 04/02/2014   CHOLHDL 4.8 09/26/2013   No results found for: LDLDIRECT    Radiology: Dg Chest 2 View  09/08/2014   CLINICAL DATA:  Fever of 2-3 days duration.  EXAM: CHEST  2 VIEW  COMPARISON:  04/19/2014  FINDINGS: There is a right internal jugular central line extending into the SVC. There is mild unchanged left hemidiaphragm elevation and mild unchanged linear left base scarring. The lungs are otherwise clear. The pulmonary vasculature is normal. There is no pleural effusion. There is prior sternotomy and CABG.  IMPRESSION: Mild linear left base  scarring.  No acute cardiopulmonary findings.   Electronically Signed   By: Andreas Newport M.D.   On: 09/08/2014 21:08   Ct Head Wo Contrast  09/30/2014   CLINICAL DATA:  Altered mental status -code stroke.  EXAM: CT HEAD WITHOUT CONTRAST  TECHNIQUE: Contiguous axial images were obtained from the base of the skull through the vertex without intravenous contrast.  COMPARISON:  09/08/2014 and prior head CT  FINDINGS: Atrophy and mild chronic small-vessel white matter ischemic changes again noted.  No acute intracranial abnormalities  are identified, including mass lesion or mass effect, hydrocephalus, extra-axial fluid collection, midline shift, hemorrhage, or acute infarction.  The visualized bony calvarium is unremarkable.  IMPRESSION: No evidence of acute intracranial abnormality.  Atrophy and chronic small-vessel white matter ischemic changes.  Critical Value/emergent results were called by telephone at the time of interpretation on 09/30/2014 at 9:09 pm to Dr. Armida Sans , who verbally acknowledged these results.   Electronically Signed   By: Margarette Canada M.D.   On: 09/30/2014 21:11   Ct Head Wo Contrast  09/08/2014   CLINICAL DATA:  Initial evaluation for acute altered mental status.  EXAM: CT HEAD WITHOUT CONTRAST  TECHNIQUE: Contiguous axial images were obtained from the base of the skull through the vertex without intravenous contrast.  COMPARISON:  Prior study from 04/17/2014.  FINDINGS: Atrophy with chronic microvascular ischemic disease present. Vascular calcifications present within the carotid siphons and distal vertebral arteries.  No acute large vessel territory infarct. No mass lesion, midline shift, or mass effect. Asymmetric hypodensity adjacent to the atrium of the left lateral ventricle a is grossly similar to previous. No associated edema. This is of doubtful clinical significance. No hydrocephalus. No extra-axial fluid collection.  Scalp soft tissues within normal limits. No acute abnormality about the orbits.  Paranasal sinuses and mastoid air cells are clear. Calvarium intact.  IMPRESSION: 1. No acute intracranial process. 2. Generalized cerebral atrophy with chronic microvascular ischemic disease, stable.   Electronically Signed   By: Jeannine Boga M.D.   On: 09/08/2014 23:19   Mr [name] Head Wo Contrast  09/30/2014   CLINICAL DATA:  Initial evaluation for acute unresponsiveness, transient right-sided weakness.  EXAM: MRI HEAD WITHOUT CONTRAST  MRA HEAD WITHOUT CONTRAST  TECHNIQUE: Multiplanar, multiecho pulse  sequences of the brain and surrounding structures were obtained without intravenous contrast. Angiographic images of the head were obtained using MRA technique without contrast.  COMPARISON:  Prior CT from earlier the same day.  FINDINGS: MRI HEAD FINDINGS  Diffuse prominence of the CSF containing spaces compatible with generalized cerebral atrophy. Mild T2/FLAIR hyperintensity within the periventricular white matter most likely related to chronic small vessel ischemic disease. Probable small remote lacunar infarct within the right basal ganglia.  No abnormal foci of restricted diffusion to suggest acute intracranial infarct. Gray-white matter differentiation maintained. Normal intravascular flow voids preserved. No acute or chronic intracranial hemorrhage.  No mass lesion, midline shift, or mass effect. Mild ventricular prominence related to global parenchymal volume loss present without hydrocephalus. No extra-axial fluid collection.  Craniocervical junction within normal limits. Incidental note made of a partially empty sella.  No acute abnormality about the orbits. Sequela prior bilateral lens extraction noted.  Mild mucosal thickening within the ethmoidal air cells and right maxillary sinus. Paranasal sinuses are otherwise clear. No mastoid effusion. Inner ear structures grossly normal.  Bone marrow signal intensity within normal limits. No scalp soft tissue abnormality.  MRA HEAD FINDINGS  ANTERIOR CIRCULATION:  Study mildly degraded  by motion artifact.  Visualized distal cervical segments of the internal carotid arteries are widely patent with antegrade flow. Petrous segments are widely patent. Atheromatous irregularity with associated moderate narrowing present within the distal cavernous/ supraclinoid ICAs bilaterally. Left A1 segment hypoplastic but patent. Right A1 segment widely patent. Anterior communicating artery and anterior cerebral arteries within normal limits. M1 segments patent without stenosis  or occlusion. MCA bifurcations normal. Distal MCA branches symmetric and well opacified.  POSTERIOR CIRCULATION:  Right vertebral artery is diminutive and attenuated to the level of the vertebrobasilar junction. Dominant left vertebral artery widely patent. Left posterior inferior cerebral artery is patent proximally. Right posterior inferior cerebral artery not well visualized. Basilar artery well opacified. Irregularity at the mid aspect of the basilar artery favored to be related to motion artifact. Superior cerebellar arteries well opacified. Both posterior cerebral arteries arise from the basilar artery. There is a short-segment moderate to severe stenosis within the left P1 segment. Posterior cerebral arteries otherwise well opacified.  IMPRESSION: MRI HEAD IMPRESSION:  1. No acute intracranial infarct or other abnormality identified. 2. Generalized cerebral atrophy with mild chronic small vessel ischemic disease.  MRA HEAD IMPRESSION:  1. No large or proximal arterial branch occlusion within the intracranial circulation. 2. Attenuated flow within the diminutive right vertebral artery. Dominant left vertebral artery widely patent to the vertebrobasilar junction. 3. Short-segment moderate to severe nonocclusive stenosis within the left P1 segment. 4. Atheromatous irregularity with multifocal moderate narrowing of the distal cavernous/ supraclinoid ICAs bilaterally.   Electronically Signed   By: Jeannine Boga M.D.   On: 09/30/2014 23:29   Mr Brain Wo Contrast  09/30/2014   CLINICAL DATA:  Initial evaluation for acute unresponsiveness, transient right-sided weakness.  EXAM: MRI HEAD WITHOUT CONTRAST  MRA HEAD WITHOUT CONTRAST  TECHNIQUE: Multiplanar, multiecho pulse sequences of the brain and surrounding structures were obtained without intravenous contrast. Angiographic images of the head were obtained using MRA technique without contrast.  COMPARISON:  Prior CT from earlier the same day.  FINDINGS:  MRI HEAD FINDINGS  Diffuse prominence of the CSF containing spaces compatible with generalized cerebral atrophy. Mild T2/FLAIR hyperintensity within the periventricular white matter most likely related to chronic small vessel ischemic disease. Probable small remote lacunar infarct within the right basal ganglia.  No abnormal foci of restricted diffusion to suggest acute intracranial infarct. Gray-white matter differentiation maintained. Normal intravascular flow voids preserved. No acute or chronic intracranial hemorrhage.  No mass lesion, midline shift, or mass effect. Mild ventricular prominence related to global parenchymal volume loss present without hydrocephalus. No extra-axial fluid collection.  Craniocervical junction within normal limits. Incidental note made of a partially empty sella.  No acute abnormality about the orbits. Sequela prior bilateral lens extraction noted.  Mild mucosal thickening within the ethmoidal air cells and right maxillary sinus. Paranasal sinuses are otherwise clear. No mastoid effusion. Inner ear structures grossly normal.  Bone marrow signal intensity within normal limits. No scalp soft tissue abnormality.  MRA HEAD FINDINGS  ANTERIOR CIRCULATION:  Study mildly degraded by motion artifact.  Visualized distal cervical segments of the internal carotid arteries are widely patent with antegrade flow. Petrous segments are widely patent. Atheromatous irregularity with associated moderate narrowing present within the distal cavernous/ supraclinoid ICAs bilaterally. Left A1 segment hypoplastic but patent. Right A1 segment widely patent. Anterior communicating artery and anterior cerebral arteries within normal limits. M1 segments patent without stenosis or occlusion. MCA bifurcations normal. Distal MCA branches symmetric and well opacified.  POSTERIOR CIRCULATION:  Right vertebral artery is diminutive and attenuated to the level of the vertebrobasilar junction. Dominant left vertebral  artery widely patent. Left posterior inferior cerebral artery is patent proximally. Right posterior inferior cerebral artery not well visualized. Basilar artery well opacified. Irregularity at the mid aspect of the basilar artery favored to be related to motion artifact. Superior cerebellar arteries well opacified. Both posterior cerebral arteries arise from the basilar artery. There is a short-segment moderate to severe stenosis within the left P1 segment. Posterior cerebral arteries otherwise well opacified.  IMPRESSION: MRI HEAD IMPRESSION:  1. No acute intracranial infarct or other abnormality identified. 2. Generalized cerebral atrophy with mild chronic small vessel ischemic disease.  MRA HEAD IMPRESSION:  1. No large or proximal arterial branch occlusion within the intracranial circulation. 2. Attenuated flow within the diminutive right vertebral artery. Dominant left vertebral artery widely patent to the vertebrobasilar junction. 3. Short-segment moderate to severe nonocclusive stenosis within the left P1 segment. 4. Atheromatous irregularity with multifocal moderate narrowing of the distal cavernous/ supraclinoid ICAs bilaterally.   Electronically Signed   By: Jeannine Boga M.D.   On: 09/30/2014 23:29    EKG:  SR PAC nonspecific ST changes    ASSESSMENT AND PLAN:  Preop:  No chest pain.  CABG 2007 last myovue 2013 non ischemic  For vascular surgery.  Will order lexiscan myovue for am and plan d/c if low risk.  Continue asa and plavix  CRF:  Dialysis per renal Less RUE numbness since vessels ligated    DM:  Discussed low carb diet.  Target hemoglobin A1c is 6.5 or less.  Continue current medications.   SignedJenkins Rouge 10/02/2014, 5:30 PM

## 2014-10-02 NOTE — Evaluation (Signed)
Occupational Therapy Evaluation Patient Details Name: Aaron Long MRN: [id number] DOB: 1940/02/15 Today's Date: 10/02/2014    History of Present Illness Pt is a 74 y/o male with PMH of HTN, HLD, DM II, CAD s/p CABG, chronic ischemic cardiomyopathy, s/p bilateral BKA, diabetic neuropathy, ESRD on hemodialysis.  Pt c/o AMS, R arm and R facial weakness.  No acute abnormalities revealed by CT or MRI.   Clinical Impression   Pt was performing transfers and ADL at a modified independent level prior to admission.  Pt has a hx of Steal Syndrome in his R hand with weakness and sensation of hand feeling cold and numb since placement of dialysis graft.  Pt demonstrated ability to perform bed mobility and sit EOB without assistance.  Pt declined OOB due to fatigue following earlier HD.  Pt reports feeling back to his baseline and is eager to go home. Will follow acutely.     Follow Up Recommendations  No OT follow up    Equipment Recommendations  None recommended by OT    Recommendations for Other Services       Precautions / Restrictions Precautions Precautions: Fall Precaution Comments: Bil BKA Other Brace/Splint: has bilat prostheses (at home) Restrictions Weight Bearing Restrictions: No      Mobility Bed Mobility Overal bed mobility: Modified Independent             General bed mobility comments: HOB slightly up with use of rail and extra time, no physical assist needed.  Transfers                      Balance     Sitting balance-Leahy Scale: Fair Sitting balance - Comments: able to maintain sitting EOB without UE support                                    ADL Overall ADL's : Needs assistance/impaired Eating/Feeding: Set up;Sitting   Grooming: Wash/dry hands;Wash/dry face;Oral care;Sitting;Set up   Upper Body Bathing: Set up;Sitting   Lower Body Bathing: Set up;Bed level   Upper Body Dressing : Set up;Sitting   Lower Body Dressing:  Set up;Sitting/lateral leans                 General ADL Comments: Pt declined OOB due to fatigue following HD. Per PT note, performs lateral transfers at a modified independent level.     Vision     Perception     Praxis      Pertinent Vitals/Pain Pain Assessment: No/denies pain     Hand Dominance Left   Extremity/Trunk Assessment Upper Extremity Assessment Upper Extremity Assessment: RUE deficits/detail RUE Deficits / Details: longstanding weakness in hand since placement of AVG (steal syndrome), strength WNL otherwise, generalized atrophy RUE Sensation:  (hand feels "cold" and "numb" to pt) RUE Coordination: decreased fine motor   Lower Extremity Assessment RLE Deficits / Details: BKA LLE Deficits / Details: BKA   Cervical / Trunk Assessment Cervical / Trunk Assessment: Normal   Communication Communication Communication: No difficulties   Cognition Arousal/Alertness: Awake/alert Behavior During Therapy: WFL for tasks assessed/performed Overall Cognitive Status: No family/caregiver present to determine baseline cognitive functioning Area of Impairment: Memory     Memory: Decreased short-term memory             General Comments       Exercises       Shoulder Instructions  Home Living Family/patient expects to be discharged to:: Private residence Living Arrangements: Spouse/significant other Available Help at Discharge: Family;Available PRN/intermittently Type of Home: Apartment Home Access: Level entry     Home Layout: One level     Bathroom Shower/Tub: Tub/shower unit;Curtain   Biochemist, clinical: Standard Bathroom Accessibility: Yes How Accessible: Accessible via wheelchair;Accessible via walker Home Equipment: Tub bench;Wheelchair - [employer] - 2 wheels   Additional Comments: Pt states he is at his baseline.      Prior Functioning/Environment    Gait / Transfers Assistance Needed: Patient reports he uses prostheses for  transfers and that he could transfer without assist pta. ADL's / Homemaking Assistance Needed: Pt was independent in sponge bathing and toileting, does not like his tub bench.        OT Diagnosis: Generalized weakness   OT Problem List: Decreased strength;Decreased activity tolerance;Impaired balance (sitting and/or standing);Impaired sensation;Impaired UE functional use;Decreased coordination   OT Treatment/Interventions: Self-care/ADL training;Therapeutic exercise;Therapeutic activities;Patient/family education;Balance training    OT Goals(Current goals can be found in the care plan section) Acute Rehab OT Goals Patient Stated Goal: go home OT Goal Formulation: With patient Time For Goal Achievement: 10/09/14 Potential to Achieve Goals: Good ADL Goals Pt Will Transfer to Toilet: with modified independence;regular height toilet Pt Will Perform Toileting - Clothing Manipulation and hygiene: with modified independence;sitting/lateral leans Pt/caregiver will Perform Home Exercise Program: Increased strength;Right Upper extremity;With theraputty;Independently  OT Frequency: Min 2X/week   Barriers to D/C:            Co-evaluation              End of Session    Activity Tolerance: Patient limited by fatigue Patient left: in bed;with call bell/phone within reach   Time: 1445-1505 OT Time Calculation (min): 20 min Charges:  OT General Charges $OT Visit: 1 Procedure OT Evaluation $Initial OT Evaluation Tier I: 1 Procedure G-Codes: OT G-codes **NOT FOR INPATIENT CLASS** Functional Assessment Tool Used: clinical judgement Functional Limitation: Self care Self Care Current Status (X4801): At least 20 percent but less than 40 percent impaired, limited or restricted Self Care Goal Status (K5537): At least 1 percent but less than 20 percent impaired, limited or restricted  [name] 10/02/2014, 3:42 PM  [phone]

## 2014-10-02 NOTE — Progress Notes (Signed)
[facility] PROGRESS NOTE  Aaron Long YHC:[id number] DOB: 1940/05/08 DOA: 09/30/2014 PCP: [name], NP  Subjective:  Feels oriented and at baseline. No CP, SOB, abdominal pain, confusion. Still with right arm weakness, but states that has been chronic and unchanged x 5 months since dialysis fistula was placed.  Assessment/Plan:  TIA - Right arm weakness/right facial weakness resolved. MRI Brain neg for cva, MRA Brain neg for significant stenosis. Echo negative for embolic source, Carotid doppler shows >80 % stenosis on left ICA.  LDL is 117, A1c is 10.2.Continue ASA/Plavix, Pravachol increased to 40 mg daily. Await recommendations from [facility] regarding ICA stenosis.  Possible Dialysis-Associated Steal Syndrome - With numbness, weakness and tingling of RUE related to fistula. Vascular Surgery has been consulted, patient declines ligating current fistula and inserting one on left side. They recommend follow-up in 3-4 weeks to revisit this option   Recent sepsis from recent infected Port-A-Cath/PermCath removed on 8/30:growing gram-negative organism resistant to Cefzil and should complete a total of 3 weeks of Fortaz   HTN - BPs have been stable, continued hold of amlodipine-specially in a setting of carotid artery stenosis.  Hyperlipidemia - as above  Type 2 Diabetes Mellitus with Diabetic Neuropathy - CBG's stable with Levemi 15 units BID and SSI.Continue lyrica for neuropathy. A1C not at goal-will require better glycemic control long term.  ESRD on dialysis - Nephrology following  Chronic Diastolic DVV:[id number]. Volume removal   Anemia of Chronic Renal Disease - Hg near baseline at 11.6, continue current regimen and monitor closely  Cervical disc disorder with radiculopathy of cervical region: prn Norco  GERD - Stable, continue pepcid prn  Prostate cancer: s/p surgery. No acute issues.On Cardura  Code Status: Full Family Communication: None at bedside   Disposition Plan: Discharge to home when ready  Consultants:  Neurology  Nephrology  Vascular Surgery  Procedures:  Echocardiogram  EEG  Inpatient hemodialysis  Antibiotics:  Ceftazidime 10/02/2014 >>  Objective: Filed Vitals:   10/02/14 1400  BP: 116/49  Pulse: 77  Temp: 98.2 F (36.8 C)  Resp: 18    Intake/Output Summary (Last 24 hours) at 10/02/14 1523 Last data filed at 10/02/14 1400  Gross per 24 hour  Intake    840 ml  Output   1500 ml  Net   -660 ml   Filed Weights   10/02/14 0520 10/02/14 0810 10/02/14 1225  Weight: 81.2 kg (179 lb 0.2 oz) 82.3 kg (181 lb 7 oz) 80.8 kg (178 lb 2.1 oz)    Exam:   General:  NAD, resting comfortably in bed.   Cardiovascular: RRR, mild ejection murmur   Respiratory: CTAB, no wheezes, rales, or rhonchi. Unlabored  Abdomen: Soft, non-tender, no masses, +BS  Musculoskeletal: B/l BKA.   Neuro: No gross focal deficits  Psychiatric: Normal mood, flat affect. Speech clear and appropriate  Data Reviewed: Basic Metabolic Panel:  Recent Labs Lab 09/30/14 2054 09/30/14 2104 10/02/14 0730  NA 138 139 138  K 4.4 4.3 4.2  CL 96* 97* 101  CO2 30  --  26  GLUCOSE 146* 146* 108*  BUN 35* 38* 57*  CREATININE 5.71* 5.20* 7.41*  CALCIUM 8.9  --  9.5  PHOS  --   --  6.5*   Liver Function Tests:  Recent Labs Lab 09/30/14 2054 10/02/14 0730  AST 16  --   ALT 8*  --   ALKPHOS 70  --   BILITOT 0.6  --   PROT 6.7  --  ALBUMIN 3.2* 2.9*   No results for input(s): LIPASE, AMYLASE in the last 168 hours. No results for input(s): AMMONIA in the last 168 hours. CBC:  Recent Labs Lab 09/30/14 2054 09/30/14 2104 10/02/14 0730  WBC 5.6  --  6.0  NEUTROABS 2.5  --   --   HGB 11.1* 12.9* 11.6*  HCT 35.1* 38.0* 36.3*  MCV 98.6  --  98.1  PLT 207  --  183   Cardiac Enzymes: No results for input(s): CKTOTAL, CKMB, CKMBINDEX, TROPONINI in the last 168 hours. BNP (last 3 results) No results for input(s): BNP  in the last 8760 hours.  ProBNP (last 3 results) No results for input(s): PROBNP in the last 8760 hours.  CBG:  Recent Labs Lab 10/01/14 1113 10/01/14 1626 10/01/14 2153 10/02/14 0701 10/02/14 1258  GLUCAP 135* 124* 158* 129* 95    Recent Results (from the past 240 hour(s))  Blood culture (routine x 2)     Status: None (Preliminary result)   Collection Time: 10/01/14  2:40 AM  Result Value Ref Range Status   Specimen Description BLOOD LEFT ANTECUBITAL  Final   Special Requests BOTTLES DRAWN AEROBIC AND ANAEROBIC 5CC  Final   Culture NO GROWTH 1 DAY  Final   Report Status PENDING  Incomplete  Blood culture (routine x 2)     Status: None (Preliminary result)   Collection Time: 10/01/14  2:45 AM  Result Value Ref Range Status   Specimen Description BLOOD LEFT HAND  Final   Special Requests BOTTLES DRAWN AEROBIC AND ANAEROBIC 5CC  Final   Culture NO GROWTH 1 DAY  Final   Report Status PENDING  Incomplete     Studies: Ct Head Wo Contrast  09/30/2014   CLINICAL DATA:  Altered mental status -code stroke.  EXAM: CT HEAD WITHOUT CONTRAST  TECHNIQUE: Contiguous axial images were obtained from the base of the skull through the vertex without intravenous contrast.  COMPARISON:  09/08/2014 and prior head CT  FINDINGS: Atrophy and mild chronic small-vessel white matter ischemic changes again noted.  No acute intracranial abnormalities are identified, including mass lesion or mass effect, hydrocephalus, extra-axial fluid collection, midline shift, hemorrhage, or acute infarction.  The visualized bony calvarium is unremarkable.  IMPRESSION: No evidence of acute intracranial abnormality.  Atrophy and chronic small-vessel white matter ischemic changes.  Critical Value/emergent results were called by telephone at the time of interpretation on 09/30/2014 at 9:09 pm to Dr. Armida Sans , who verbally acknowledged these results.   Electronically Signed   By: Margarette Canada M.D.   On: 09/30/2014 21:11   Mr [name]  Head Wo Contrast  09/30/2014   CLINICAL DATA:  Initial evaluation for acute unresponsiveness, transient right-sided weakness.  EXAM: MRI HEAD WITHOUT CONTRAST  MRA HEAD WITHOUT CONTRAST  TECHNIQUE: Multiplanar, multiecho pulse sequences of the brain and surrounding structures were obtained without intravenous contrast. Angiographic images of the head were obtained using MRA technique without contrast.  COMPARISON:  Prior CT from earlier the same day.  FINDINGS: MRI HEAD FINDINGS  Diffuse prominence of the CSF containing spaces compatible with generalized cerebral atrophy. Mild T2/FLAIR hyperintensity within the periventricular white matter most likely related to chronic small vessel ischemic disease. Probable small remote lacunar infarct within the right basal ganglia.  No abnormal foci of restricted diffusion to suggest acute intracranial infarct. Gray-white matter differentiation maintained. Normal intravascular flow voids preserved. No acute or chronic intracranial hemorrhage.  No mass lesion, midline shift, or mass effect. Mild ventricular  prominence related to global parenchymal volume loss present without hydrocephalus. No extra-axial fluid collection.  Craniocervical junction within normal limits. Incidental note made of a partially empty sella.  No acute abnormality about the orbits. Sequela prior bilateral lens extraction noted.  Mild mucosal thickening within the ethmoidal air cells and right maxillary sinus. Paranasal sinuses are otherwise clear. No mastoid effusion. Inner ear structures grossly normal.  Bone marrow signal intensity within normal limits. No scalp soft tissue abnormality.  MRA HEAD FINDINGS  ANTERIOR CIRCULATION:  Study mildly degraded by motion artifact.  Visualized distal cervical segments of the internal carotid arteries are widely patent with antegrade flow. Petrous segments are widely patent. Atheromatous irregularity with associated moderate narrowing present within the distal  cavernous/ supraclinoid ICAs bilaterally. Left A1 segment hypoplastic but patent. Right A1 segment widely patent. Anterior communicating artery and anterior cerebral arteries within normal limits. M1 segments patent without stenosis or occlusion. MCA bifurcations normal. Distal MCA branches symmetric and well opacified.  POSTERIOR CIRCULATION:  Right vertebral artery is diminutive and attenuated to the level of the vertebrobasilar junction. Dominant left vertebral artery widely patent. Left posterior inferior cerebral artery is patent proximally. Right posterior inferior cerebral artery not well visualized. Basilar artery well opacified. Irregularity at the mid aspect of the basilar artery favored to be related to motion artifact. Superior cerebellar arteries well opacified. Both posterior cerebral arteries arise from the basilar artery. There is a short-segment moderate to severe stenosis within the left P1 segment. Posterior cerebral arteries otherwise well opacified.  IMPRESSION: MRI HEAD IMPRESSION:  1. No acute intracranial infarct or other abnormality identified. 2. Generalized cerebral atrophy with mild chronic small vessel ischemic disease.  MRA HEAD IMPRESSION:  1. No large or proximal arterial branch occlusion within the intracranial circulation. 2. Attenuated flow within the diminutive right vertebral artery. Dominant left vertebral artery widely patent to the vertebrobasilar junction. 3. Short-segment moderate to severe nonocclusive stenosis within the left P1 segment. 4. Atheromatous irregularity with multifocal moderate narrowing of the distal cavernous/ supraclinoid ICAs bilaterally.   Electronically Signed   By: Jeannine Boga M.D.   On: 09/30/2014 23:29   Mr Brain Wo Contrast  09/30/2014   CLINICAL DATA:  Initial evaluation for acute unresponsiveness, transient right-sided weakness.  EXAM: MRI HEAD WITHOUT CONTRAST  MRA HEAD WITHOUT CONTRAST  TECHNIQUE: Multiplanar, multiecho pulse  sequences of the brain and surrounding structures were obtained without intravenous contrast. Angiographic images of the head were obtained using MRA technique without contrast.  COMPARISON:  Prior CT from earlier the same day.  FINDINGS: MRI HEAD FINDINGS  Diffuse prominence of the CSF containing spaces compatible with generalized cerebral atrophy. Mild T2/FLAIR hyperintensity within the periventricular white matter most likely related to chronic small vessel ischemic disease. Probable small remote lacunar infarct within the right basal ganglia.  No abnormal foci of restricted diffusion to suggest acute intracranial infarct. Gray-white matter differentiation maintained. Normal intravascular flow voids preserved. No acute or chronic intracranial hemorrhage.  No mass lesion, midline shift, or mass effect. Mild ventricular prominence related to global parenchymal volume loss present without hydrocephalus. No extra-axial fluid collection.  Craniocervical junction within normal limits. Incidental note made of a partially empty sella.  No acute abnormality about the orbits. Sequela prior bilateral lens extraction noted.  Mild mucosal thickening within the ethmoidal air cells and right maxillary sinus. Paranasal sinuses are otherwise clear. No mastoid effusion. Inner ear structures grossly normal.  Bone marrow signal intensity within normal limits. No scalp soft tissue abnormality.  MRA HEAD FINDINGS  ANTERIOR CIRCULATION:  Study mildly degraded by motion artifact.  Visualized distal cervical segments of the internal carotid arteries are widely patent with antegrade flow. Petrous segments are widely patent. Atheromatous irregularity with associated moderate narrowing present within the distal cavernous/ supraclinoid ICAs bilaterally. Left A1 segment hypoplastic but patent. Right A1 segment widely patent. Anterior communicating artery and anterior cerebral arteries within normal limits. M1 segments patent without stenosis  or occlusion. MCA bifurcations normal. Distal MCA branches symmetric and well opacified.  POSTERIOR CIRCULATION:  Right vertebral artery is diminutive and attenuated to the level of the vertebrobasilar junction. Dominant left vertebral artery widely patent. Left posterior inferior cerebral artery is patent proximally. Right posterior inferior cerebral artery not well visualized. Basilar artery well opacified. Irregularity at the mid aspect of the basilar artery favored to be related to motion artifact. Superior cerebellar arteries well opacified. Both posterior cerebral arteries arise from the basilar artery. There is a short-segment moderate to severe stenosis within the left P1 segment. Posterior cerebral arteries otherwise well opacified.  IMPRESSION: MRI HEAD IMPRESSION:  1. No acute intracranial infarct or other abnormality identified. 2. Generalized cerebral atrophy with mild chronic small vessel ischemic disease.  MRA HEAD IMPRESSION:  1. No large or proximal arterial branch occlusion within the intracranial circulation. 2. Attenuated flow within the diminutive right vertebral artery. Dominant left vertebral artery widely patent to the vertebrobasilar junction. 3. Short-segment moderate to severe nonocclusive stenosis within the left P1 segment. 4. Atheromatous irregularity with multifocal moderate narrowing of the distal cavernous/ supraclinoid ICAs bilaterally.   Electronically Signed   By: Jeannine Boga M.D.   On: 09/30/2014 23:29    Scheduled Meds: . aspirin EC  81 mg Oral q morning - 10a  . calcium acetate  667 mg Oral TID WC  . ceftAZIDime (FORTAZ) IVPB 2 gram/50 mL D5W (Pyxis)  2 g Intravenous Q M,W,F-HD  . clopidogrel  75 mg Oral Daily  . doxercalciferol  5 mcg Intravenous Q M,W,F-HD  . enoxaparin (LOVENOX) injection  30 mg Subcutaneous Daily  . famotidine  20 mg Oral QHS  . ferric gluconate (FERRLECIT/NULECIT) IV  62.5 mg Intravenous Q Wed-HD  . insulin aspart  0-9 Units  Subcutaneous TID WC  . insulin detemir  15 Units Subcutaneous BID  . multivitamin  1 tablet Oral QHS  . pravastatin  40 mg Oral q1800  . pregabalin  75 mg Oral Daily  . QUEtiapine  25 mg Oral QHS  . senna-docusate  2 tablet Oral BID  . sodium chloride  3 mL Intravenous Q12H  . tamsulosin  0.4 mg Oral Daily   Continuous Infusions:   Principal Problem:   TIA (transient ischemic attack) Active Problems:   HTN (hypertension)   S/P BKA (below knee amputation)   Diabetic neuropathy, type II diabetes mellitus   Anemia in chronic renal disease   ESRD on dialysis  Time spent: [place], Student-PA  [facility] . If 7PM-7AM, please contact night-coverage at [email or web address], password [facility] 10/02/2014, 3:23 PM     Attending MD note  Patient was seen, examined,treatment plan was discussed with the PA-S.  I have personally reviewed the clinical findings, lab, imaging studies and management of this patient in detail. I agree with the documentation, as recorded by thePA-S.   Presumed TIA-work up complete-[facility] consulted-plans are for outpatient Carotid Endarterectomy. Suspect home in am.   [facility]

## 2014-10-02 NOTE — Progress Notes (Signed)
VASCULAR LAB PRELIMINARY  PRELIMINARY  PRELIMINARY  PRELIMINARY  Carotid duplex  completed.    Preliminary report:  Right:  1-39% ICA stenosis.  Left:  Greater than 80% internal carotid artery stenosis.  Bilateral:  Vertebral artery flow is antegrade.  Right vertebral waveform is atypical.  Left vertebral appears stenotic proximally.  Long,Aaron, RVT 10/02/2014, 2:41 PM

## 2014-10-02 NOTE — Consult Note (Addendum)
Vascular Surgery Consultation  Reason for Consult: Possible steal syndrome right upper extremity following radial-cephalic AV fistula  HPI: Aaron Long is a 74 y.o. male who presents for evaluation of possible steal syndrome right upper extremity. Patient had radial cephalic AV fistula created in April of this year. 06/17/2014 he had ligation of 2 competing branches in the mid and proximal forearm. He was experiencing some numbness and tingling at the time of the second procedure which was not severe. He was found to have a very calcified radial artery at the time of his original surgery but it was of adequate size to create the fistula. He has not had much problems with the hand until he started on dialysis and he has been having some pain in the hand while on dialysis at times he describes as severe. After dialysis is over he has some numbness and tingling which she can tolerate he states. He thinks that it may be getting a little bit better since this fistula has only been utilized for 1-2 weeks.   Past Medical History  Diagnosis Date  . PAD (peripheral artery disease)     a. R CEA 2007. b. Hx L BKA in 2013. c. s/p LE angioplasty in 2014. d. Hx R BKA in 2014.  . Gangrene of toe     dry  . Neuromuscular disorder     diabetic neuropathy  . Hypertension   . Hyperlipidemia   . Peripheral neuropathy   . Constipation     takes Miralax daily  . Hemorrhoids   . Hx of colonic polyps   . Coronary artery disease     a. s/p NSTEMI/CABGx4 in 2007 - LIMA-LAD, SVG-optional diagonal, SVVG-OM, SVG-dRCA. b. Nuc 05/2011: nonischemic.  [name] BPH (benign prostatic hypertrophy)   . Abnormality of gait   . DVT (deep venous thrombosis)     a. Lower extremity DVT in 2013.  [name] Hypertrophy of prostate without urinary obstruction and other lower urinary tract symptoms (LUTS)   . Disorder of bone and cartilage, unspecified   . Lower limb amputation, below knee   . Anemia   . Secondary hyperparathyroidism      Secondary Hyperpara- Thyroidism, Renal  . Ischemic cardiomyopathy     a. EF 40% in 2007. b. 51% by nuc 05/2011.  [name] Complication of anesthesia     "he gets delirious"  . Myocardial infarction 2005  . Type II diabetes mellitus   . Diabetic peripheral neuropathy   . ESRD on dialysis   . Kidney stones   . Prostate cancer 2010   Past Surgical History  Procedure Laterality Date  . Prostatectomy  2009  . Knee cartilage surgery Left 1964  . Coronary artery bypass graft  2005    CABG X4  . Carotid endarterectomy Right 2005  . Cataract extraction w/ intraocular lens  implant, bilateral Bilateral 2011  . Colonoscopy    . Cardiac catheterization  05/28/11  . Amputation  06/14/2011    Procedure: AMPUTATION DIGIT;  Surgeon: Elam Dutch, MD;  Location: [facility] OR;  Service: Vascular;  Laterality: Left;  Amputation Left fifth toe  . Amputation  06/16/2011    Procedure: AMPUTATION BELOW KNEE;  Surgeon: Elam Dutch, MD;  Location: [place];  Service: Vascular;  Laterality: Left;  . I&d extremity  01/31/2012    Procedure: IRRIGATION AND DEBRIDEMENT EXTREMITY;  Surgeon: Elam Dutch, MD;  Location: [place];  Service: Vascular;  Laterality: Left;  I & D Left BKA   .  Amputation Right 06/12/2012    Procedure: AMPUTATION DIGIT;  Surgeon: Elam Dutch, MD;  Location: [facility] OR;  Service: Vascular;  Laterality: Right;  GREAT TOE  . Amputation Right 08/08/2012    Procedure: AMPUTATION BELOW KNEE;  Surgeon: Elam Dutch, MD;  Location: [facility] OR;  Service: Vascular;  Laterality: Right;  . Abdominal aortagram N/A 05/28/2011    Procedure: ABDOMINAL Maxcine Ham;  Surgeon: Elam Dutch, MD;  Location: [facility] CATH LAB;  Service: Cardiovascular;  Laterality: N/A;  . Abdominal aortagram N/A 06/02/2012    Procedure: ABDOMINAL Maxcine Ham;  Surgeon: Elam Dutch, MD;  Location: [facility] CATH LAB;  Service: Cardiovascular;  Laterality: N/A;  . Abdominal aortagram N/A 06/09/2012    Procedure: ABDOMINAL Maxcine Ham;  Surgeon: Elam Dutch, MD;  Location: [facility] CATH LAB;  Service: Cardiovascular;  Laterality: N/A;  . Insertion of dialysis catheter Right 04/19/2014    Procedure: INSERTION OF DIALYSIS CATHETER-INTERNAL JUGULAR;  Surgeon: Mal Misty, MD;  Location: [place];  Service: Vascular;  Laterality: Right;  . Av fistula placement Right 04/19/2014    Procedure: RIGHT ARM ARTERIOVENOUS (AV) FISTULA CREATION;  Surgeon: Mal Misty, MD;  Location: [place];  Service: Vascular;  Laterality: Right;  . Ligation of competing branches of arteriovenous fistula Right 06/19/2014    Procedure: Right Arm LIGATION OF COMPETING BRANCHES OF RADIOCEPHALIC ARTERIOVENOUS FISTULA;  Surgeon: Mal Misty, MD;  Location: [place];  Service: Vascular;  Laterality: Right;   Social History   Social History  . Marital Status: Married    Spouse Name: N/A  . Number of Children: 3  . Years of Education: N/A   Occupational History  . Retired-[employer]    Social History Main Topics  . Smoking status: Former Smoker -- 1.00 packs/day for 30 years    Types: Cigarettes    Quit date: 04/28/1991  . Smokeless tobacco: Never Used  . Alcohol Use: Yes     Comment: 04/17/2014 "might have a glass of wine at Christmas"  . Drug Use: No  . Sexual Activity: No   Other Topics Concern  . None   Social History Narrative   Family History  Problem Relation Age of Onset  . Coronary artery disease Neg Hx   . Anesthesia problems Neg Hx   . Hypotension Neg Hx   . Malignant hyperthermia Neg Hx   . Pseudochol deficiency Neg Hx   . Hyperlipidemia Mother   . Hypertension Mother   . Cancer Mother   . Hyperlipidemia Father   . Hypertension Father   . Kidney disease Father   . Heart disease Sister   . Alzheimer's disease Sister   . Cancer Brother   . Other Sister    Allergies  Allergen Reactions  . Oxycodone Other (See Comments)    Hallucinations   Prior to Admission medications   Medication Sig Start Date End Date Taking? Authorizing Provider   AMBULATORY NON FORMULARY MEDICATION One Touch Ultra 2 Test Strips Sig:  Check blood sugars Three times daily to keep blood sugars under control Dx: E11.9 09/09/14  Yes [name], NP  AMBULATORY NON FORMULARY MEDICATION BD U/P Mini Pen [place] 31GX5MM Use three times daily as directed DX: E11.9 09/09/14  Yes [name], NP  amLODipine (NORVASC) 10 MG tablet Take 1 tablet (10 mg total) by mouth daily. 09/14/14  Yes Florencia Reasons, MD  aspirin EC 81 MG tablet Take 1 tablet (81 mg total) by mouth every morning. 08/21/12  Yes Daniel J Angiulli, PA-C  calcium acetate (PHOSLO) 667 MG capsule Take 1 capsule (667 mg total) by mouth 3 (three) times daily with meals. 09/14/14  Yes Florencia Reasons, MD  cefTAZidime 2 g in dextrose 5 % 50 mL Inject 2 g into the vein every Monday, Wednesday, and Friday at 8 PM. 09/13/14  Yes Florencia Reasons, MD  clopidogrel (PLAVIX) 75 MG tablet take 1 tablet by mouth once daily 02/11/14  Yes Blanchie Serve, MD  HYDROcodone-acetaminophen (NORCO/VICODIN) 5-325 MG per tablet Take 1 tablet by mouth every 12 hours as needed for pain 08/14/14  Yes Estill Dooms, MD  insulin aspart (NOVOLOG) 100 UNIT/ML FlexPen Inject 5 Units into the skin 3 (three) times daily with meals. Inject 5 units three times a day with meals if blood sugar greater than 150 only, DX: 250.71 08/07/13  Yes Mahima Pandey, MD  Insulin Detemir (LEVEMIR) 100 UNIT/ML Pen Inject 15 Units into the skin 2 (two) times daily. 05/03/14  Yes Bary Leriche, PA-C  Misc. Devices [facility]) [place] Patient needs new Wheelchair due to his being broken. 01/09/14  Yes Mahima Bubba Camp, MD  multivitamin (RENA-VIT) TABS tablet Take 1 tablet by mouth at bedtime. 05/03/14  Yes Ivan Anchors Love, PA-C  pravastatin (PRAVACHOL) 20 MG tablet Take 1 tablet (20 mg total) by mouth daily. 08/07/13  Yes Mahima Bubba Camp, MD  pregabalin (LYRICA) 75 MG capsule Take one tablet by mouth once daily for pains 08/30/14  Yes Tiffany L Reed, DO  QUEtiapine (SEROQUEL) 25 MG tablet Take 1  tablet (25 mg total) by mouth daily. 05/03/14  Yes Ivan Anchors Love, PA-C  senna-docusate (SENOKOT-S) 8.6-50 MG per tablet Take 2 tablets by mouth 2 (two) times daily. 04/25/14  Yes Florencia Reasons, MD  tamsulosin (FLOMAX) 0.4 MG CAPS capsule Take 1 capsule (0.4 mg total) by mouth daily. 09/14/14  Yes Florencia Reasons, MD     Positive ROS:   All other systems have been reviewed and were otherwise negative with the exception of those mentioned in the HPI and as above.  Physical Exam: Filed Vitals:   10/02/14 1400  BP: 116/49  Pulse: 77  Temp: 98.2 F (36.8 C)  Resp: 18    General: Alert, no acute distress HEENT: Normal for age Cardiovascular: Regular rate and rhythm. Carotid pulses 2+, no bruits audible Respiratory: Clear to auscultation. No cyanosis, no use of accessory musculature GI: No organomegaly, abdomen is soft and non-tender Skin: No lesions in the area of chief complaint Neurologic: Sensation intact distally Psychiatric: Patient is competent for consent with normal mood and affect Musculoskeletal: No obvious deformities Extremities: Right upper extremity with palpable pulse and thrill and radial-cephalic AV fistula. Slight decreased sensation right hand. Excellent Doppler flow in proximal cephalic vein below antecubital area. Radial flow distally does improve slightly with compression of the fi   Assessment/Plan:  Patient with end-stage renal disease who has right radial-cephalic AV fistula created in April of this year which does have steal syndrome. Have offered the patient the option of ligating the fistula and insertion of dialysis catheter but he would like to give this some more time. He states it is tolerable to present time. He does understand that if we ligate this fistula we will need to insert a fistula and his dominant-left upper extremity. He is not interested in that at the present time.  I will see him in the office in 3-4 weeks to see how this is progressing. If he should develop  severe pain  in the right hand and would like to proceed with ligation of the fistula we can certainly arrange that at any time.   [name], MD 10/02/2014 3:41 PM The patient was admitted 48 hours ago because of a syncopal episode. He denies any lateralizing weakness, aphasia, amaurosis fugax, diplopia, or blurred vision. Apparently his wife found him briefly unresponsive and he was brought to the hospital by EMS. He does have history of coronary artery bypass grafting, end-stage renal disease, bilateral below-knee amputations with type 1 diabetes mellitus.  His neurologic exam during this hospitalization has been unremarkable Carotid duplex exam today revealed approximate 90% left ICA stenosis with extremely high velocities of 440 cm/s over 190 cm/s.  I think given the patient's history he should have left carotid endarterectomy. He will need cardiac evaluation for clearance. Discussed this with Trish with the cardiology service and she will arrange for consultation. We'll likely discharge patient and planned surgery for next week if he is deemed to be a reasonable candidate for surgery and he is agreeable.

## 2014-10-02 NOTE — Progress Notes (Signed)
OT Cancellation Note  Patient Details Name: KIETH HARTIS MRN: [id number] DOB: 02-11-1940   Cancelled Treatment:    Reason Eval/Treat Not Completed: Patient at procedure or test/ unavailable (Pt currently in HD.  Will continue to follow.)  [name] 10/02/2014, 8:11 AM

## 2014-10-02 NOTE — Progress Notes (Signed)
[facility] Progress Note   Subjective: no complaints  Filed Vitals:   10/02/14 1030 10/02/14 1045 10/02/14 1100 10/02/14 1130  BP: 119/60 92/41 111/70 142/73  Pulse: 76 81 85 73  Temp:      TempSrc:      Resp: 13 14 14  73  Weight:      SpO2:       Exam: Gen alert, well appearing, and in no distress, overweight and obese OX3 Mouth - mucous memb moist, no upper teeth, poor lower Neck - no JVD Chest - clear to auscultation, dec'd air movement bilat Heart - S1 and S2 normal, S4 present, systolic murmur WU9/8 Abdomen - obese, pos bs, liver down 3 cm, soft Musculoskeletal - atrophy R hand, defect in RLA distal to fistula, Bilat BKA Extremities - as above, AVF RFA, B&T but deep and arm edematous Skin - trophic changes in hands, Moles, seb kerratoses Neuro - grip is weak in R hand compared to left (+2/4)  Dialyzes at [facility] on MWF. Primary Nephrologist [place]. EDW 81.5 kg. HD Bath 2K, 2.25 Ca, Dialyzer 180NR, Heparin 8500. Access R FA AVF.    Assessment: 1 TIA Preexisting CVD. Resolved. W/U underway 2 ESRD: plan HD today 3 Hypertension: bp ok off Amlodipine, keep off, avoid low bp 4. Anemia of ESRD: off ESA 5. Metabolic Bone Disease: cont Vit D 6 PVD  7 Steal syndrome R hand- sig steal w dec'd grip, VVS consulted 8 CAD 9 Prostate Ca 10 ^ lipids 11 DM needs better Control 12 GERD  P HD today, VVS called to see    Kelly Splinter MD  pager [phone]    cell [phone]  10/02/2014, 12:03 PM     Recent Labs Lab 09/30/14 2054 09/30/14 2104 10/02/14 0730  NA 138 139 138  K 4.4 4.3 4.2  CL 96* 97* 101  CO2 30  --  26  GLUCOSE 146* 146* 108*  BUN 35* 38* 57*  CREATININE 5.71* 5.20* 7.41*  CALCIUM 8.9  --  9.5  PHOS  --   --  6.5*    Recent Labs Lab 09/30/14 2054 10/02/14 0730  AST 16  --   ALT 8*  --   ALKPHOS 70  --   BILITOT 0.6  --   PROT 6.7  --   ALBUMIN 3.2* 2.9*    Recent Labs Lab 09/30/14 2054 09/30/14 2104  10/02/14 0730  WBC 5.6  --  6.0  NEUTROABS 2.5  --   --   HGB 11.1* 12.9* 11.6*  HCT 35.1* 38.0* 36.3*  MCV 98.6  --  98.1  PLT 207  --  183   . aspirin EC  81 mg Oral q morning - 10a  . calcium acetate  667 mg Oral TID WC  . ceftAZIDime (FORTAZ) IVPB 2 gram/50 mL D5W (Pyxis)  2 g Intravenous Q M,W,F-HD  . clopidogrel  75 mg Oral Daily  . doxercalciferol  5 mcg Intravenous Q M,W,F-HD  . enoxaparin (LOVENOX) injection  30 mg Subcutaneous Daily  . famotidine  20 mg Oral QHS  . ferric gluconate (FERRLECIT/NULECIT) IV  62.5 mg Intravenous Q Wed-HD  . insulin aspart  0-9 Units Subcutaneous TID WC  . insulin detemir  15 Units Subcutaneous BID  . multivitamin  1 tablet Oral QHS  . pravastatin  40 mg Oral q1800  . pregabalin  75 mg Oral Daily  . QUEtiapine  25 mg Oral QHS  . senna-docusate  2 tablet Oral BID  .  sodium chloride  3 mL Intravenous Q12H  . tamsulosin  0.4 mg Oral Daily     sodium chloride, sodium chloride, alteplase, heparin, heparin, lidocaine (PF), lidocaine-prilocaine, pentafluoroprop-tetrafluoroeth

## 2014-10-02 NOTE — Progress Notes (Signed)
Speech Pathology:  Reason Eval/Treat Not Completed: Patient at procedure or test/ unavailable (Pt currently in HD. Will continue to follow.) Amanda L. Tivis Ringer, [place] CCC/SLP Pager [phone]

## 2014-10-03 ENCOUNTER — Observation Stay (HOSPITAL_COMMUNITY): Payer: Medicare Other

## 2014-10-03 DIAGNOSIS — I6529 Occlusion and stenosis of unspecified carotid artery: Secondary | ICD-10-CM | POA: Insufficient documentation

## 2014-10-03 DIAGNOSIS — E1140 Type 2 diabetes mellitus with diabetic neuropathy, unspecified: Secondary | ICD-10-CM | POA: Diagnosis not present

## 2014-10-03 DIAGNOSIS — N186 End stage renal disease: Secondary | ICD-10-CM | POA: Diagnosis not present

## 2014-10-03 DIAGNOSIS — N189 Chronic kidney disease, unspecified: Secondary | ICD-10-CM | POA: Diagnosis not present

## 2014-10-03 DIAGNOSIS — D631 Anemia in chronic kidney disease: Secondary | ICD-10-CM | POA: Diagnosis not present

## 2014-10-03 DIAGNOSIS — I120 Hypertensive chronic kidney disease with stage 5 chronic kidney disease or end stage renal disease: Secondary | ICD-10-CM | POA: Diagnosis not present

## 2014-10-03 DIAGNOSIS — G459 Transient cerebral ischemic attack, unspecified: Secondary | ICD-10-CM | POA: Diagnosis not present

## 2014-10-03 DIAGNOSIS — Z01810 Encounter for preprocedural cardiovascular examination: Secondary | ICD-10-CM | POA: Diagnosis not present

## 2014-10-03 DIAGNOSIS — Z992 Dependence on renal dialysis: Secondary | ICD-10-CM | POA: Diagnosis not present

## 2014-10-03 DIAGNOSIS — I6522 Occlusion and stenosis of left carotid artery: Secondary | ICD-10-CM | POA: Diagnosis not present

## 2014-10-03 LAB — GLUCOSE, CAPILLARY
GLUCOSE-CAPILLARY: 65 mg/dL (ref 65–99)
GLUCOSE-CAPILLARY: 239 mg/dL — AB (ref 65–99)
Glucose-Capillary: 127 mg/dL — ABNORMAL HIGH (ref 65–99)
Glucose-Capillary: 145 mg/dL — ABNORMAL HIGH (ref 65–99)
Glucose-Capillary: 170 mg/dL — ABNORMAL HIGH (ref 65–99)

## 2014-10-03 LAB — NM MYOCAR MULTI W/SPECT W/WALL MOTION / EF
CHL CUP NUCLEAR SDS: 2
LV sys vol: 80 mL
LVDIAVOL: 114 mL
RATE: 0.32
SRS: 8
SSS: 10
TID: 1.12

## 2014-10-03 MED ORDER — FLEET ENEMA 7-19 GM/118ML RE ENEM
1.0000 | ENEMA | Freq: Once | RECTAL | Status: AC
  Administered 2014-10-03: 1 via RECTAL
  Filled 2014-10-03: qty 1

## 2014-10-03 MED ORDER — ASPIRIN 81 MG PO CHEW
81.0000 mg | CHEWABLE_TABLET | ORAL | Status: AC
  Administered 2014-10-04: 81 mg via ORAL
  Filled 2014-10-03: qty 1

## 2014-10-03 MED ORDER — REGADENOSON 0.4 MG/5ML IV SOLN
0.4000 mg | Freq: Once | INTRAVENOUS | Status: DC
  Filled 2014-10-03: qty 5

## 2014-10-03 MED ORDER — DEXTROSE 50 % IV SOLN
1.0000 | Freq: Once | INTRAVENOUS | Status: AC
Start: 2014-10-03 — End: 2014-10-03
  Administered 2014-10-03: 50 mL via INTRAVENOUS
  Filled 2014-10-03: qty 50

## 2014-10-03 MED ORDER — PRAVASTATIN SODIUM 20 MG PO TABS: 40.0000 mg | ORAL_TABLET | Freq: Every day | ORAL | Status: DC

## 2014-10-03 MED ORDER — REGADENOSON 0.4 MG/5ML IV SOLN
INTRAVENOUS | Status: AC
  Filled 2014-10-03: qty 5

## 2014-10-03 MED ORDER — LACTULOSE 10 GM/15ML PO SOLN
30.0000 g | Freq: Two times a day (BID) | ORAL | Status: DC | PRN
  Administered 2014-10-03: 30 g via ORAL
  Filled 2014-10-03: qty 45

## 2014-10-03 MED ORDER — TECHNETIUM TC 99M SESTAMIBI GENERIC - CARDIOLITE
10.0000 | Freq: Once | INTRAVENOUS | Status: AC | PRN
  Administered 2014-10-03: 10 via INTRAVENOUS

## 2014-10-03 MED ORDER — TECHNETIUM TC 99M SESTAMIBI GENERIC - CARDIOLITE
30.0000 | Freq: Once | INTRAVENOUS | Status: AC | PRN
  Administered 2014-10-03: 30 via INTRAVENOUS

## 2014-10-03 NOTE — Progress Notes (Signed)
Hypoglycemic Event  CBG: 65  Treatment: D50 IV 25 mL  Symptoms: Shaky and Hungry  Follow-up CBG: Time:1039 CBG Result:145  Possible Reasons for Event: Inadequate meal intake  Comments/MD notified: Dr. Sloan Leiter notified    Fleet Contras  Remember to initiate Hypoglycemia Order Set & complete

## 2014-10-03 NOTE — Progress Notes (Signed)
PT Cancellation Note  Patient Details Name: Aaron Long MRN: [id number] DOB: 1940/03/22   Cancelled Treatment:    Reason Eval/Treat Not Completed: Patient at procedure or test/unavailable   Lorita Officer 10/03/2014, 11:22 AM  Lorita Officer, SPT

## 2014-10-03 NOTE — Progress Notes (Signed)
     Aaron Long has been out of his room today for testing. I have not examined him today nor spoken to him. I have reviewed the full cardiology consult note from yesterday. If his stress myoview shows high risk features, he will need cardiac cath to define current state of CAD. If stress test does not suggest high risk features, would discharge home and plan to proceed with CEA next week.   Aaron Long 10/03/2014 12:28 PM

## 2014-10-03 NOTE — Progress Notes (Signed)
Patient Name: Aaron Long Date of Encounter: 10/03/2014   SUBJECTIVE  Seen in nuclear med. Laying comfortably in bed. Tolerated procedure well. No CP or palpitation.   CURRENT MEDS . aspirin EC  81 mg Oral q morning - 10a  . calcium acetate  667 mg Oral TID WC  . ceftAZIDime (FORTAZ) IVPB 2 gram/50 mL D5W (Pyxis)  2 g Intravenous Q M,W,F-HD  . clopidogrel  75 mg Oral Daily  . doxercalciferol  5 mcg Intravenous Q M,W,F-HD  . enoxaparin (LOVENOX) injection  30 mg Subcutaneous Daily  . famotidine  20 mg Oral QHS  . ferric gluconate (FERRLECIT/NULECIT) IV  62.5 mg Intravenous Q Wed-HD  . insulin aspart  0-9 Units Subcutaneous TID WC  . insulin detemir  15 Units Subcutaneous BID  . multivitamin  1 tablet Oral QHS  . pravastatin  40 mg Oral q1800  . pregabalin  75 mg Oral Daily  . QUEtiapine  25 mg Oral QHS  . regadenoson      . regadenoson  0.4 mg Intravenous Once  . senna-docusate  2 tablet Oral BID  . sodium chloride  3 mL Intravenous Q12H  . tamsulosin  0.4 mg Oral Daily    OBJECTIVE  Filed Vitals:   10/03/14 1203 10/03/14 1219 10/03/14 1221 10/03/14 1223  BP: 198/79  135/67 132/66  Pulse: 82 97 91 89  Temp:      TempSrc:      Resp:      Weight:      SpO2:        Intake/Output Summary (Last 24 hours) at 10/03/14 1224 Last data filed at 10/02/14 2218  Gross per 24 hour  Intake    480 ml  Output   1700 ml  Net  -1220 ml   Filed Weights   10/02/14 0520 10/02/14 0810 10/02/14 1225  Weight: 179 lb 0.2 oz (81.2 kg) 181 lb 7 oz (82.3 kg) 178 lb 2.1 oz (80.8 kg)    PHYSICAL EXAM  General: Pleasant, NAD. Neuro: Alert and oriented X 3. Moves all extremities spontaneously. Psych: Normal affect. HEENT:  Normal  Neck: Supple without bruits or JVD. Lungs:  Resp regular and unlabored, CTA. Heart: RRR no s3, s4. Systolic murmurs. Abdomen: Soft, non-tender, non-distended, BS + x 4.  Extremities: No clubbing, cyanosis or edema. Radials 2+ and equal bilaterally.  S/P bilateral BKA  Fistula RUE with thrill   Accessory Clinical Findings  CBC  Recent Labs  09/30/14 2054 09/30/14 2104 10/02/14 0730  WBC 5.6  --  6.0  NEUTROABS 2.5  --   --   HGB 11.1* 12.9* 11.6*  HCT 35.1* 38.0* 36.3*  MCV 98.6  --  98.1  PLT 207  --  536   Basic Metabolic Panel  Recent Labs  09/30/14 2054 09/30/14 2104 10/02/14 0730  NA 138 139 138  K 4.4 4.3 4.2  CL 96* 97* 101  CO2 30  --  26  GLUCOSE 146* 146* 108*  BUN 35* 38* 57*  CREATININE 5.71* 5.20* 7.41*  CALCIUM 8.9  --  9.5  PHOS  --   --  6.5*   Liver Function Tests  Recent Labs  09/30/14 2054 10/02/14 0730  AST 16  --   ALT 8*  --   ALKPHOS 70  --   BILITOT 0.6  --   PROT 6.7  --   ALBUMIN 3.2* 2.9*   Hemoglobin A1C  Recent Labs  10/01/14 0245  HGBA1C 10.2*  Fasting Lipid Panel  Recent Labs  10/01/14 0148  CHOL 178  HDL 29*  LDLCALC 117*  TRIG 158*  CHOLHDL 6.1    TELE  Seen in nuc med.   Radiology/Studies    ASSESSMENT AND PLAN    Preop: No chest pain. CABG 2007. last myoview 2013 non ischemic For vascular surgery - He is thinking about surgery. Continue asa and plavix. Pending final myoview report. No change in EKG during test.   CRF: Dialysis per renal Less RUE numbness since vessels ligated   DM: Low carb diet. Target hemoglobin A1c is 6.5 or less. Continue current medications.  Signed, Leanor Kail PA-C   [name] has been out of his room today for testing. I have not examined him today nor spoken to him. I have reviewed the full cardiology consult note from yesterday. If his stress myoview shows high risk features, he will need cardiac cath to define current state of CAD. If stress test does not suggest high risk features, would discharge home and plan to proceed with CEA next week.   Long,Aaron 10/03/2014 12:28 PM

## 2014-10-03 NOTE — Progress Notes (Addendum)
Patient ID: Aaron Long, male   DOB: 1940/01/26, 74 y.o.   MRN: [id number] Patient to have Myoview today Await cardiology evaluation  Tentatively plan left carotid endarterectomy next Wednesday, September 28 if patient agreeable and cardiac risk satisfactory Patient initially declined going down for Myoview but is now agreed to proceed neck slide has not made definite decision regarding possible surgery next week

## 2014-10-03 NOTE — Progress Notes (Signed)
STROKE TEAM PROGRESS NOTE  HPI Aaron Long is an 74 y.o. male with a myriad of medical problems including HTN, hyperlipidemia, DM, CAD s/p CABG, s/p right aCEA, chronic ischemic cardiomyopathy, ESRD on HD, s/p bilateral BKA, and prostate cancer, brought in by EMS due to acute onset of unresponsiveness, right arm and face weakness. At baseline, patient is bed bound. Patient reportedly had uneventful HD today, came back home around 5 pm, and few hours later his wife noted to that he was unresponsive sitting in his wheelchair. According to EMS, his right arm and face were weak and initially he was not answering questions but while being transported to the ED became responsive. NIHSS 0. CT brain was personally reviewed and showed no acute abnormality.  Date last known well: 9/19//31 Time last known well: 8 pm tPA Given: no, symptoms resolved NIHSS: 0 MRS: 5     SUBJECTIVE (INTERVAL HISTORY) No family members present. Carotid ultrasound study shows greater than 80% left ICA stenosis and multiple right vertebral artery waveform. EEG study was normal. Vascular surgery plan  elective left carotid surgery in a few weeks    OBJECTIVE Temp:  [98.3 F (36.8 C)-99 F (37.2 C)] 99 F (37.2 C) (09/22 0943) Pulse Rate:  [76-97] 90 (09/22 1225) Cardiac Rhythm:  [-] Normal sinus rhythm (09/22 0840) Resp:  [18] 18 (09/22 0607) BP: (88-198)/(51-79) 132/66 mmHg (09/22 1223) SpO2:  [98 %-100 %] 99 % (09/22 0943)  CBC:   Recent Labs Lab 09/30/14 2054 09/30/14 2104 10/02/14 0730  WBC 5.6  --  6.0  NEUTROABS 2.5  --   --   HGB 11.1* 12.9* 11.6*  HCT 35.1* 38.0* 36.3*  MCV 98.6  --  98.1  PLT 207  --  324    Basic Metabolic Panel:   Recent Labs Lab 09/30/14 2054 09/30/14 2104 10/02/14 0730  NA 138 139 138  K 4.4 4.3 4.2  CL 96* 97* 101  CO2 30  --  26  GLUCOSE 146* 146* 108*  BUN 35* 38* 57*  CREATININE 5.71* 5.20* 7.41*  CALCIUM 8.9  --  9.5  PHOS  --   --  6.5*     Lipid Panel:     Component Value Date/Time   CHOL 178 10/01/2014 0148   CHOL 147 04/02/2014 1540   TRIG 158* 10/01/2014 0148   HDL 29* 10/01/2014 0148   HDL 36* 04/02/2014 1540   CHOLHDL 6.1 10/01/2014 0148   CHOLHDL 4.1 04/02/2014 1540   VLDL 32 10/01/2014 0148   LDLCALC 117* 10/01/2014 0148   LDLCALC 97 04/02/2014 1540   HgbA1c:  Lab Results  Component Value Date   HGBA1C 10.2* 10/01/2014   Urine Drug Screen:     Component Value Date/Time   LABOPIA NONE DETECTED 09/08/2014 2013   COCAINSCRNUR NONE DETECTED 09/08/2014 2013   LABBENZ NONE DETECTED 09/08/2014 2013   AMPHETMU NONE DETECTED 09/08/2014 2013   THCU NONE DETECTED 09/08/2014 2013   LABBARB NONE DETECTED 09/08/2014 2013      IMAGING  Ct Head Wo Contrast 09/30/2014    No evidence of acute intracranial abnormality.  Atrophy and chronic small-vessel white matter ischemic changes.     Mr [name] Head Wo Contrast 09/30/2014    MRI HEAD 1. No acute intracranial infarct or other abnormality identified.  2. Generalized cerebral atrophy with mild chronic small vessel ischemic disease.   MRA HEAD  1. No large or proximal arterial branch occlusion within the intracranial circulation.  2. Attenuated  flow within the diminutive right vertebral artery. Dominant left vertebral artery widely patent to the vertebrobasilar junction.  3. Short-segment moderate to severe nonocclusive stenosis within the left P1 segment.  4. Atheromatous irregularity with multifocal moderate narrowing of the distal cavernous/ supraclinoid ICAs bilaterally.        PHYSICAL EXAM Obese middle-aged African-American gentleman not in distress. Bilateral below-knee amputation. . Afebrile. Head is nontraumatic. Neck is supple without bruit.    Cardiac exam soft ejection murmur or gallop. Lungs are clear to auscultation. Distal pulses are not felt in lower extremities due to amputation.  Neurological Exam ;  Awake  Alert oriented x 3. Normal speech  and language.eye movements full without nystagmus.fundi were not visualized. Vision acuity and fields appear normal. Hearing is normal. Palatal movements are normal. Face symmetric. Tongue midline. Normal strength, tone, reflexes and coordination. Limited lower extremity exam due to bilateral below-knee amputation. Normal sensation. Gait deferred.     ASSESSMENT/PLAN [name] is a 74 y.o. male with history of hypertension, hyperlipidemia, diabetes mellitus, coronary artery disease with previous coronary artery bypass graft surgery, previous right carotid endarterectomy, cardiomyopathy, end-stage renal disease on dialysis, status post bilateral BKA's, and prostate cancer presenting with unresponsiveness followed by right arm and right facial weakness. He did not receive IV t-PA due to resolution of deficits.  Possible TIA: Likely due to severe extracranial left carotid stenosis  Resultant resolution of deficits  MRI  no acute intracranial infarct  MRA  no large or proximal arterial branch occlusions  Carotid Doppler  pending  2D Echo  EF 50-55%. No cardiac source of emboli identified.  LDL 117  HgbA1c pending  VTE prophylaxis - Lovenox Diet Heart Room service appropriate?: Yes; Fluid consistency:: Thin Diet - low sodium heart healthy Diet Carb Modified  aspirin 81 mg orally every day and clopidogrel 75 mg orally every day prior to admission, now on aspirin 81 mg orally every day and clopidogrel 75 mg orally every day  Patient counseled to be compliant with his antithrombotic medications  Ongoing aggressive stroke risk factor management  Therapy recommendations: Pending  Disposition: Pending  Hypertension  Stable   Hyperlipidemia  Home meds: Pravachol 20 mg daily resumed in hospital  LDL 117, goal < 70  Increase Pravachol to 40 mg daily.  Continue statin at discharge  Diabetes  HgbA1c  10.2, goal < 7.0  Controlled  Other Stroke Risk  Factors  Advanced age  Cigarette smoker, quit smoking 23 years ago  ETOH use  Obesity, Body mass index is 27.09 kg/(m^2).   Coronary artery disease   Other Active Problems  End-stage renal disease - on dialysis  Mild anemia  PLAN Continue aspirin/Plavix for secondary stroke prevention as well as Pravachol 40 mg daily. Maintain aggressive risk factor modification. Elective left carotid endarterectomy in a few weeks. Stroke service will sign off. Kindly call for questions. Follow-up as an outpatient in stroke clinic in 2 months. Hospital day #        Antony Contras, MD Medical Director [facility] Pager: [phone] 10/03/2014 2:46 PM    To contact Stroke Continuity provider, please refer to [email or web address] After hours, contact General Neurology

## 2014-10-03 NOTE — Progress Notes (Signed)
[facility] Progress Note   Subjective: no complaints  Filed Vitals:   10/03/14 1221 10/03/14 1223 10/03/14 1225 10/03/14 1450  BP: 135/67 132/66  152/88  Pulse: 91 89 90 91  Temp:    98.4 F (36.9 C)  TempSrc:    Oral  Resp:    20  Weight:      SpO2:    95%   Exam: Gen alert, well appearing, and in no distress, overweight and obese OX3 Mouth - mucous memb moist, no upper teeth, poor lower Neck - no JVD Chest - clear to auscultation, dec'd air movement bilat Heart - S1 and S2 normal, S4 present, systolic murmur TD3/2 Abdomen - obese, pos bs, liver down 3 cm, soft Musculoskeletal - atrophy R hand, defect in RLA distal to fistula, Bilat BKA Extremities - as above, AVF RFA, B&T but deep and arm edematous Skin - trophic changes in hands, Moles, seb kerratoses Neuro - grip is weak in R hand compared to left (+2/4)  Dialyzes at [facility] on MWF. Primary Nephrologist [place]. EDW 81.5 kg. HD Bath 2K, 2.25 Ca, Dialyzer 180NR, Heparin 8500. Access R FA AVF.    Assessment: 1 TIA Preexisting CVD. Resolved. W/U underway 2 ESRD: plan HD today 3 Hypertension: bp ok off Amlodipine, keep off, avoid low bp 4. Anemia of ESRD: off ESA 5. Metabolic Bone Disease: cont Vit D 6 PVD  7 Steal syndrome R hand- sig steal w dec'd grip, VVS consulted and will f/u in OP setting 8 CAD 9 Prostate Ca 10 ^ lipids 11 DM needs better Control 12 GERD  P ok for dc from renal standpoint. HD tomorrow if still here.     Kelly Splinter MD  pager [phone]    cell [phone]  10/03/2014, 3:00 PM     Recent Labs Lab 09/30/14 2054 09/30/14 2104 10/02/14 0730  NA 138 139 138  K 4.4 4.3 4.2  CL 96* 97* 101  CO2 30  --  26  GLUCOSE 146* 146* 108*  BUN 35* 38* 57*  CREATININE 5.71* 5.20* 7.41*  CALCIUM 8.9  --  9.5  PHOS  --   --  6.5*    Recent Labs Lab 09/30/14 2054 10/02/14 0730  AST 16  --   ALT 8*  --   ALKPHOS 70  --   BILITOT 0.6  --   PROT 6.7  --   ALBUMIN  3.2* 2.9*    Recent Labs Lab 09/30/14 2054 09/30/14 2104 10/02/14 0730  WBC 5.6  --  6.0  NEUTROABS 2.5  --   --   HGB 11.1* 12.9* 11.6*  HCT 35.1* 38.0* 36.3*  MCV 98.6  --  98.1  PLT 207  --  183   . aspirin EC  81 mg Oral q morning - 10a  . calcium acetate  667 mg Oral TID WC  . ceftAZIDime (FORTAZ) IVPB 2 gram/50 mL D5W (Pyxis)  2 g Intravenous Q M,W,F-HD  . clopidogrel  75 mg Oral Daily  . doxercalciferol  5 mcg Intravenous Q M,W,F-HD  . enoxaparin (LOVENOX) injection  30 mg Subcutaneous Daily  . famotidine  20 mg Oral QHS  . ferric gluconate (FERRLECIT/NULECIT) IV  62.5 mg Intravenous Q Wed-HD  . insulin aspart  0-9 Units Subcutaneous TID WC  . insulin detemir  15 Units Subcutaneous BID  . multivitamin  1 tablet Oral QHS  . pravastatin  40 mg Oral q1800  . pregabalin  75 mg Oral Daily  .  QUEtiapine  25 mg Oral QHS  . regadenoson      . regadenoson  0.4 mg Intravenous Once  . senna-docusate  2 tablet Oral BID  . sodium chloride  3 mL Intravenous Q12H  . tamsulosin  0.4 mg Oral Daily

## 2014-10-03 NOTE — Progress Notes (Deleted)
[facility] Progress Note  Assessment/Plan: 1 TIA Preexisting CVD. Resolved. Plan left carotid endarterectomy next Wednesday, September 28 per Dr. Victorino Dike if pt consents.  2 ESRD: MWF [facility]. HD tomorrow.   3 Hypertension: bp ok off Amlodipine, keep off, avoid low bp. Had HD yesterday, Net UF 1500 post wt 80.8. Kg. Will need to lower EDW on DC.  4. Anemia of ESRD: HGB 11.6 No Esa therapy.  5. Metabolic Bone Disease: cont Vit D, Ca Acetate binders.  6 PVD  7 Steal syndrome R hand- sig steal w dec'd grip, VVS consulted 8 CAD: for myoview today. Results pending. 9 Prostate Ca 10 ^ lipids 11 DM: Primary 12 GERD    Rita H. Brown NP-C 10/03/2014, 10:34 AM  [facility] [phone]  Subjective: "I'm finally back from that test". Denies chest pain/SOB. Returned from stress test.     Objective Filed Vitals:   10/02/14 2217 10/03/14 0203 10/03/14 0607 10/03/14 0943  BP: 140/63 155/68 163/70 163/55  Pulse: 78 76 77 82  Temp: 99 F (37.2 C) 98.8 F (37.1 C) 98.5 F (36.9 C) 99 F (37.2 C)  TempSrc: Oral Oral Oral Oral  Resp: 18 18 18    Weight:      SpO2: 98% 100% 99% 99%   Physical Exam General: Well nourished appearing male in NAD Heart: S1, S2, + S4 + II/VI Systolic M. 3ICS LSB Lungs: Bilateral Breath sounds CTA A/P Abdomen: Soft, nontender Active BS. Extremities: Bilateral BKA. Ecchymosis present on side of fistula. Dialysis Access: R FA AVF +thrill + Bruit. Echymosis   Dialysis Orders: Dialyzes at [facility] on MWF. Primary Nephrologist [place]. EDW 81.5 kg. HD Bath 2K, 2.25 Ca, Dialyzer 180NR, Heparin 8500. Access .   Additional Objective Labs: Basic Metabolic Panel:  Recent Labs Lab 09/30/14 2054 09/30/14 2104 10/02/14 0730  NA 138 139 138  K 4.4 4.3 4.2  CL 96* 97* 101  CO2 30  --  26  GLUCOSE 146* 146* 108*  BUN 35* 38* 57*  CREATININE 5.71* 5.20* 7.41*  CALCIUM 8.9  --  9.5  PHOS  --   --  6.5*   Liver Function  Tests:  Recent Labs Lab 09/30/14 2054 10/02/14 0730  AST 16  --   ALT 8*  --   ALKPHOS 70  --   BILITOT 0.6  --   PROT 6.7  --   ALBUMIN 3.2* 2.9*   No results for input(s): LIPASE, AMYLASE in the last 168 hours. CBC:  Recent Labs Lab 09/30/14 2054 09/30/14 2104 10/02/14 0730  WBC 5.6  --  6.0  NEUTROABS 2.5  --   --   HGB 11.1* 12.9* 11.6*  HCT 35.1* 38.0* 36.3*  MCV 98.6  --  98.1  PLT 207  --  183   Blood Culture    Component Value Date/Time   SDES BLOOD LEFT HAND 10/01/2014 0245   SPECREQUEST BOTTLES DRAWN AEROBIC AND ANAEROBIC 5CC 10/01/2014 0245   CULT NO GROWTH 1 DAY 10/01/2014 0245   REPTSTATUS PENDING 10/01/2014 0245    Cardiac Enzymes: No results for input(s): CKTOTAL, CKMB, CKMBINDEX, TROPONINI in the last 168 hours. CBG:  Recent Labs Lab 10/02/14 1258 10/02/14 1633 10/02/14 2019 10/03/14 0647 10/03/14 1004  GLUCAP 95 249* 175* 127* 65   Iron Studies: No results for input(s): IRON, TIBC, TRANSFERRIN, FERRITIN in the last 72 hours. @lablastinr3 @ Studies/Results: No results found. Medications:   . aspirin EC  81 mg Oral q morning -  10a  . calcium acetate  667 mg Oral TID WC  . ceftAZIDime (FORTAZ) IVPB 2 gram/50 mL D5W (Pyxis)  2 g Intravenous Q M,W,F-HD  . clopidogrel  75 mg Oral Daily  . doxercalciferol  5 mcg Intravenous Q M,W,F-HD  . enoxaparin (LOVENOX) injection  30 mg Subcutaneous Daily  . famotidine  20 mg Oral QHS  . ferric gluconate (FERRLECIT/NULECIT) IV  62.5 mg Intravenous Q Wed-HD  . insulin aspart  0-9 Units Subcutaneous TID WC  . insulin detemir  15 Units Subcutaneous BID  . multivitamin  1 tablet Oral QHS  . pravastatin  40 mg Oral q1800  . pregabalin  75 mg Oral Daily  . QUEtiapine  25 mg Oral QHS  . senna-docusate  2 tablet Oral BID  . sodium chloride  3 mL Intravenous Q12H  . tamsulosin  0.4 mg Oral Daily

## 2014-10-03 NOTE — Progress Notes (Signed)
[facility] PROGRESS NOTE  Aaron Long HGD:[id number] DOB: Dec 07, 1940 DOA: 09/30/2014 PCP: [name], NP  Subjective:  Feels oriented and at baseline. No CP, SOB, abdominal pain, confusion. Still with right arm weakness, but states that has been chronic and unchanged x 5 months since dialysis fistula was placed.  Assessment/Plan: TIA - Right arm weakness/right facial weakness resolved. MRI Brain neg for cva, MRA Brain neg for significant stenosis. Echo negative for embolic source, Carotid doppler shows >80 % stenosis on left ICA.  LDL is 117, A1c is 10.2.Continue ASA/Plavix, Pravachol increased to 40 mg daily. Evaluated by vascular surgery, plans are for carotid endarterectomy in the next few weeks.   Possible Dialysis-Associated Steal Syndrome - With numbness, weakness and tingling of RUE related to fistula. Vascular Surgery has been consulted, patient declines ligating current fistula and inserting one on left side. They recommend follow-up in 3-4 weeks to revisit this option   Recent sepsis from recent infected Port-A-Cath/PermCath removed on 8/30:growing gram-negative organism resistant to Cefzil and should complete a total of 3 weeks of Fortaz   HTN - BPs have been stable, continued hold of amlodipine-specially in a setting of carotid artery stenosis.  Hyperlipidemia - as above  Type 2 Diabetes Mellitus with Diabetic Neuropathy - CBG's stable with Levemi 15 units BID and SSI.Continue lyrica for neuropathy. A1C not at goal-will require better glycemic control long term.  ESRD on dialysis - Nephrology following  Chronic Diastolic DQQ:[id number]. Volume removal   Anemia of Chronic Renal Disease - Hb near baseline at 11.6, continue current regimen and monitor closely  Cervical disc disorder with radiculopathy of cervical region: prn Norco  GERD - Stable, continue pepcid prn  Prostate cancer: s/p surgery. No acute issues.On Cardura  Code Status: Full Family  Communication: None at bedside  Disposition Plan: Discharge to home when ready  Consultants:  Neurology  Nephrology  Vascular Surgery  Procedures:  Echocardiogram  EEG  Inpatient hemodialysis  Antibiotics:  Ceftazidime 10/02/2014 >>  Objective: Filed Vitals:   10/03/14 1225  BP:   Pulse: 90  Temp:   Resp:     Intake/Output Summary (Last 24 hours) at 10/03/14 1337 Last data filed at 10/02/14 2218  Gross per 24 hour  Intake    480 ml  Output    200 ml  Net    280 ml   Filed Weights   10/02/14 0520 10/02/14 0810 10/02/14 1225  Weight: 81.2 kg (179 lb 0.2 oz) 82.3 kg (181 lb 7 oz) 80.8 kg (178 lb 2.1 oz)    Exam:   General:  NAD, resting comfortably in bed.   Cardiovascular: RRR, mild ejection murmur   Respiratory: CTAB, no wheezes, rales, or rhonchi. Unlabored  Abdomen: Soft, non-tender, no masses, +BS  Musculoskeletal: B/l BKA.   Neuro: No gross focal deficits  Psychiatric: Normal mood, flat affect. Speech clear and appropriate  Data Reviewed: Basic Metabolic Panel:  Recent Labs Lab 09/30/14 2054 09/30/14 2104 10/02/14 0730  NA 138 139 138  K 4.4 4.3 4.2  CL 96* 97* 101  CO2 30  --  26  GLUCOSE 146* 146* 108*  BUN 35* 38* 57*  CREATININE 5.71* 5.20* 7.41*  CALCIUM 8.9  --  9.5  PHOS  --   --  6.5*   Liver Function Tests:  Recent Labs Lab 09/30/14 2054 10/02/14 0730  AST 16  --   ALT 8*  --   ALKPHOS 70  --   BILITOT 0.6  --  PROT 6.7  --   ALBUMIN 3.2* 2.9*   No results for input(s): LIPASE, AMYLASE in the last 168 hours. No results for input(s): AMMONIA in the last 168 hours. CBC:  Recent Labs Lab 09/30/14 2054 09/30/14 2104 10/02/14 0730  WBC 5.6  --  6.0  NEUTROABS 2.5  --   --   HGB 11.1* 12.9* 11.6*  HCT 35.1* 38.0* 36.3*  MCV 98.6  --  98.1  PLT 207  --  183   Cardiac Enzymes: No results for input(s): CKTOTAL, CKMB, CKMBINDEX, TROPONINI in the last 168 hours. BNP (last 3 results) No results for  input(s): BNP in the last 8760 hours.  ProBNP (last 3 results) No results for input(s): PROBNP in the last 8760 hours.  CBG:  Recent Labs Lab 10/02/14 1633 10/02/14 2019 10/03/14 0647 10/03/14 1004 10/03/14 1039  GLUCAP 249* 175* 127* 65 145*    Recent Results (from the past 240 hour(s))  Blood culture (routine x 2)     Status: None (Preliminary result)   Collection Time: 10/01/14  2:40 AM  Result Value Ref Range Status   Specimen Description BLOOD LEFT ANTECUBITAL  Final   Special Requests BOTTLES DRAWN AEROBIC AND ANAEROBIC 5CC  Final   Culture NO GROWTH 1 DAY  Final   Report Status PENDING  Incomplete  Blood culture (routine x 2)     Status: None (Preliminary result)   Collection Time: 10/01/14  2:45 AM  Result Value Ref Range Status   Specimen Description BLOOD LEFT HAND  Final   Special Requests BOTTLES DRAWN AEROBIC AND ANAEROBIC 5CC  Final   Culture NO GROWTH 1 DAY  Final   Report Status PENDING  Incomplete     Studies: No results found.  Scheduled Meds: . aspirin EC  81 mg Oral q morning - 10a  . calcium acetate  667 mg Oral TID WC  . ceftAZIDime (FORTAZ) IVPB 2 gram/50 mL D5W (Pyxis)  2 g Intravenous Q M,W,F-HD  . clopidogrel  75 mg Oral Daily  . doxercalciferol  5 mcg Intravenous Q M,W,F-HD  . enoxaparin (LOVENOX) injection  30 mg Subcutaneous Daily  . famotidine  20 mg Oral QHS  . ferric gluconate (FERRLECIT/NULECIT) IV  62.5 mg Intravenous Q Wed-HD  . insulin aspart  0-9 Units Subcutaneous TID WC  . insulin detemir  15 Units Subcutaneous BID  . multivitamin  1 tablet Oral QHS  . pravastatin  40 mg Oral q1800  . pregabalin  75 mg Oral Daily  . QUEtiapine  25 mg Oral QHS  . regadenoson      . regadenoson  0.4 mg Intravenous Once  . senna-docusate  2 tablet Oral BID  . sodium chloride  3 mL Intravenous Q12H  . tamsulosin  0.4 mg Oral Daily   Continuous Infusions:   Principal Problem:   TIA (transient ischemic attack) Active Problems:   HTN  (hypertension)   S/P BKA (below knee amputation)   Diabetic neuropathy, type II diabetes mellitus   Anemia in chronic renal disease   ESRD on dialysis   Preop cardiovascular exam  Time spent: [place], MD  [facility] . If 7PM-7AM, please contact night-coverage at [email or web address], password [facility] 10/03/2014, 1:37 PM

## 2014-10-03 NOTE — Progress Notes (Signed)
Patient presented for Lexiscan. Tolerated procedure well. Result to follow. He did have some SOB with during test, improved afterwards. No EKG change during test. Unable to add documentation in cupid. [facility] to read.   Aaron Long,Aaron Long, Aaron Long

## 2014-10-03 NOTE — Discharge Summary (Addendum)
PATIENT DETAILS Name: Aaron Long Age: 74 y.o. Sex: male Date of Birth: 11-11-1940 MRN: [id number]. Admitting Physician: Reubin Milan, MD [id number], Carlos American, NP  Admit Date: 09/30/2014 Discharge date: 10/04/2014  Recommendations for Outpatient Follow-up:  1. Please ensure follow-up with vascular surgery  PRIMARY DISCHARGE DIAGNOSIS:  Principal Problem:   TIA (transient ischemic attack) Active Problems:   HTN (hypertension)   S/P BKA (below knee amputation)   Diabetic neuropathy, type II diabetes mellitus   Anemia in chronic renal disease   ESRD on dialysis   Preop cardiovascular exam   Carotid stenosis      PAST MEDICAL HISTORY: Past Medical History  Diagnosis Date  . PAD (peripheral artery disease)     a. R CEA 2007. b. Hx L BKA in 2013. c. s/p LE angioplasty in 2014. d. Hx R BKA in 2014.  . Gangrene of toe     dry  . Neuromuscular disorder     diabetic neuropathy  . Hypertension   . Hyperlipidemia   . Peripheral neuropathy   . Constipation     takes Miralax daily  . Hemorrhoids   . Hx of colonic polyps   . Coronary artery disease     a. s/p NSTEMI/CABGx4 in 2007 - LIMA-LAD, SVG-optional diagonal, SVVG-OM, SVG-dRCA. b. Nuc 05/2011: nonischemic.  [name] BPH (benign prostatic hypertrophy)   . Abnormality of gait   . DVT (deep venous thrombosis)     a. Lower extremity DVT in 2013.  [name] Hypertrophy of prostate without urinary obstruction and other lower urinary tract symptoms (LUTS)   . Disorder of bone and cartilage, unspecified   . Lower limb amputation, below knee   . Anemia   . Secondary hyperparathyroidism     Secondary Hyperpara- Thyroidism, Renal  . Ischemic cardiomyopathy     a. EF 40% in 2007. b. 51% by nuc 05/2011.  [name] Complication of anesthesia     "he gets delirious"  . Myocardial infarction 2005  . Type II diabetes mellitus   . Diabetic peripheral neuropathy   . ESRD on dialysis   . Kidney stones   . Prostate cancer 2010    DISCHARGE  MEDICATIONS: Current Discharge Medication List    CONTINUE these medications which have CHANGED   Details  pravastatin (PRAVACHOL) 20 MG tablet Take 2 tablets (40 mg total) by mouth daily. Qty: 60 tablet, Refills: 0      CONTINUE these medications which have NOT CHANGED   Details  !! AMBULATORY NON FORMULARY MEDICATION One Touch Ultra 2 Test Strips Sig:  Check blood sugars Three times daily to keep blood sugars under control Dx: E11.9 Qty: 100 strip, Refills: 11    !! AMBULATORY NON FORMULARY MEDICATION BD U/P Mini Pen [place] 31GX5MM Use three times daily as directed DX: E11.9 Qty: 100 each, Refills: 11    aspirin EC 81 MG tablet Take 1 tablet (81 mg total) by mouth every morning.    calcium acetate (PHOSLO) 667 MG capsule Take 1 capsule (667 mg total) by mouth 3 (three) times daily with meals. Qty: 90 capsule, Refills: 1    clopidogrel (PLAVIX) 75 MG tablet take 1 tablet by mouth once daily Qty: 90 tablet, Refills: 1    HYDROcodone-acetaminophen (NORCO/VICODIN) 5-325 MG per tablet Take 1 tablet by mouth every 12 hours as needed for pain Qty: 60 tablet, Refills: 0   Associated Diagnoses: Status post bilateral below knee amputation    insulin aspart (NOVOLOG) 100 UNIT/ML FlexPen Inject 5 Units  into the skin 3 (three) times daily with meals. Inject 5 units three times a day with meals if blood sugar greater than 150 only, DX: 250.71    Insulin Detemir (LEVEMIR) 100 UNIT/ML Pen Inject 15 Units into the skin 2 (two) times daily. Qty: 15 mL, Refills: 3    Misc. Devices [facility] [place] Patient needs new Wheelchair due to his being broken. Qty: 1 each, Refills: 0    multivitamin (RENA-VIT) TABS tablet Take 1 tablet by mouth at bedtime. Qty: 30 tablet, Refills: 1    pregabalin (LYRICA) 75 MG capsule Take one tablet by mouth once daily for pains Qty: 30 capsule, Refills: 0    QUEtiapine (SEROQUEL) 25 MG tablet Take 1 tablet (25 mg total) by mouth daily. Qty: 30 tablet,  Refills: 1    senna-docusate (SENOKOT-S) 8.6-50 MG per tablet Take 2 tablets by mouth 2 (two) times daily. Qty: 60 tablet, Refills: 0    tamsulosin (FLOMAX) 0.4 MG CAPS capsule Take 1 capsule (0.4 mg total) by mouth daily. Qty: 30 capsule, Refills: 3     !! - Potential duplicate medications found. Please discuss with provider.    STOP taking these medications     amLODipine (NORVASC) 10 MG tablet      cefTAZidime 2 g in dextrose 5 % 50 mL      famotidine (PEPCID) 20 MG tablet         ALLERGIES:   Allergies  Allergen Reactions  . Oxycodone Other (See Comments)    Hallucinations    BRIEF HPI:  See H&P, Labs, Consult and Test reports for all details in brief, patient is a 74 year old male with history of end-stage renal disease on hemodialysis, type 2 diabetes, dyslipidemia, who was brought to the emergency room for evaluation of altered mental status. He was also found to have right arm and right facial weakness. Patient was subsequently admitted for further evaluation and treatment.  CONSULTATIONS:   cardiology, nephrology, neurology and vascular surgery  PERTINENT RADIOLOGIC STUDIES: Dg Chest 2 View  09/08/2014   CLINICAL DATA:  Fever of 2-3 days duration.  EXAM: CHEST  2 VIEW  COMPARISON:  04/19/2014  FINDINGS: There is a right internal jugular central line extending into the SVC. There is mild unchanged left hemidiaphragm elevation and mild unchanged linear left base scarring. The lungs are otherwise clear. The pulmonary vasculature is normal. There is no pleural effusion. There is prior sternotomy and CABG.  IMPRESSION: Mild linear left base scarring.  No acute cardiopulmonary findings.   Electronically Signed   By: Andreas Newport M.D.   On: 09/08/2014 21:08   Ct Head Wo Contrast  09/30/2014   CLINICAL DATA:  Altered mental status -code stroke.  EXAM: CT HEAD WITHOUT CONTRAST  TECHNIQUE: Contiguous axial images were obtained from the base of the skull through the vertex  without intravenous contrast.  COMPARISON:  09/08/2014 and prior head CT  FINDINGS: Atrophy and mild chronic small-vessel white matter ischemic changes again noted.  No acute intracranial abnormalities are identified, including mass lesion or mass effect, hydrocephalus, extra-axial fluid collection, midline shift, hemorrhage, or acute infarction.  The visualized bony calvarium is unremarkable.  IMPRESSION: No evidence of acute intracranial abnormality.  Atrophy and chronic small-vessel white matter ischemic changes.  Critical Value/emergent results were called by telephone at the time of interpretation on 09/30/2014 at 9:09 pm to Dr. Armida Sans , who verbally acknowledged these results.   Electronically Signed   By: Margarette Canada M.D.   On:  09/30/2014 21:11   Ct Head Wo Contrast  09/08/2014   CLINICAL DATA:  Initial evaluation for acute altered mental status.  EXAM: CT HEAD WITHOUT CONTRAST  TECHNIQUE: Contiguous axial images were obtained from the base of the skull through the vertex without intravenous contrast.  COMPARISON:  Prior study from 04/17/2014.  FINDINGS: Atrophy with chronic microvascular ischemic disease present. Vascular calcifications present within the carotid siphons and distal vertebral arteries.  No acute large vessel territory infarct. No mass lesion, midline shift, or mass effect. Asymmetric hypodensity adjacent to the atrium of the left lateral ventricle a is grossly similar to previous. No associated edema. This is of doubtful clinical significance. No hydrocephalus. No extra-axial fluid collection.  Scalp soft tissues within normal limits. No acute abnormality about the orbits.  Paranasal sinuses and mastoid air cells are clear. Calvarium intact.  IMPRESSION: 1. No acute intracranial process. 2. Generalized cerebral atrophy with chronic microvascular ischemic disease, stable.   Electronically Signed   By: Jeannine Boga M.D.   On: 09/08/2014 23:19   Mr [name] Head Wo Contrast  09/30/2014    CLINICAL DATA:  Initial evaluation for acute unresponsiveness, transient right-sided weakness.  EXAM: MRI HEAD WITHOUT CONTRAST  MRA HEAD WITHOUT CONTRAST  TECHNIQUE: Multiplanar, multiecho pulse sequences of the brain and surrounding structures were obtained without intravenous contrast. Angiographic images of the head were obtained using MRA technique without contrast.  COMPARISON:  Prior CT from earlier the same day.  FINDINGS: MRI HEAD FINDINGS  Diffuse prominence of the CSF containing spaces compatible with generalized cerebral atrophy. Mild T2/FLAIR hyperintensity within the periventricular white matter most likely related to chronic small vessel ischemic disease. Probable small remote lacunar infarct within the right basal ganglia.  No abnormal foci of restricted diffusion to suggest acute intracranial infarct. Gray-white matter differentiation maintained. Normal intravascular flow voids preserved. No acute or chronic intracranial hemorrhage.  No mass lesion, midline shift, or mass effect. Mild ventricular prominence related to global parenchymal volume loss present without hydrocephalus. No extra-axial fluid collection.  Craniocervical junction within normal limits. Incidental note made of a partially empty sella.  No acute abnormality about the orbits. Sequela prior bilateral lens extraction noted.  Mild mucosal thickening within the ethmoidal air cells and right maxillary sinus. Paranasal sinuses are otherwise clear. No mastoid effusion. Inner ear structures grossly normal.  Bone marrow signal intensity within normal limits. No scalp soft tissue abnormality.  MRA HEAD FINDINGS  ANTERIOR CIRCULATION:  Study mildly degraded by motion artifact.  Visualized distal cervical segments of the internal carotid arteries are widely patent with antegrade flow. Petrous segments are widely patent. Atheromatous irregularity with associated moderate narrowing present within the distal cavernous/ supraclinoid ICAs  bilaterally. Left A1 segment hypoplastic but patent. Right A1 segment widely patent. Anterior communicating artery and anterior cerebral arteries within normal limits. M1 segments patent without stenosis or occlusion. MCA bifurcations normal. Distal MCA branches symmetric and well opacified.  POSTERIOR CIRCULATION:  Right vertebral artery is diminutive and attenuated to the level of the vertebrobasilar junction. Dominant left vertebral artery widely patent. Left posterior inferior cerebral artery is patent proximally. Right posterior inferior cerebral artery not well visualized. Basilar artery well opacified. Irregularity at the mid aspect of the basilar artery favored to be related to motion artifact. Superior cerebellar arteries well opacified. Both posterior cerebral arteries arise from the basilar artery. There is a short-segment moderate to severe stenosis within the left P1 segment. Posterior cerebral arteries otherwise well opacified.  IMPRESSION: MRI HEAD IMPRESSION:  1. No acute intracranial infarct or other abnormality identified. 2. Generalized cerebral atrophy with mild chronic small vessel ischemic disease.  MRA HEAD IMPRESSION:  1. No large or proximal arterial branch occlusion within the intracranial circulation. 2. Attenuated flow within the diminutive right vertebral artery. Dominant left vertebral artery widely patent to the vertebrobasilar junction. 3. Short-segment moderate to severe nonocclusive stenosis within the left P1 segment. 4. Atheromatous irregularity with multifocal moderate narrowing of the distal cavernous/ supraclinoid ICAs bilaterally.   Electronically Signed   By: Jeannine Boga M.D.   On: 09/30/2014 23:29   Mr Brain Wo Contrast  09/30/2014   CLINICAL DATA:  Initial evaluation for acute unresponsiveness, transient right-sided weakness.  EXAM: MRI HEAD WITHOUT CONTRAST  MRA HEAD WITHOUT CONTRAST  TECHNIQUE: Multiplanar, multiecho pulse sequences of the brain and  surrounding structures were obtained without intravenous contrast. Angiographic images of the head were obtained using MRA technique without contrast.  COMPARISON:  Prior CT from earlier the same day.  FINDINGS: MRI HEAD FINDINGS  Diffuse prominence of the CSF containing spaces compatible with generalized cerebral atrophy. Mild T2/FLAIR hyperintensity within the periventricular white matter most likely related to chronic small vessel ischemic disease. Probable small remote lacunar infarct within the right basal ganglia.  No abnormal foci of restricted diffusion to suggest acute intracranial infarct. Gray-white matter differentiation maintained. Normal intravascular flow voids preserved. No acute or chronic intracranial hemorrhage.  No mass lesion, midline shift, or mass effect. Mild ventricular prominence related to global parenchymal volume loss present without hydrocephalus. No extra-axial fluid collection.  Craniocervical junction within normal limits. Incidental note made of a partially empty sella.  No acute abnormality about the orbits. Sequela prior bilateral lens extraction noted.  Mild mucosal thickening within the ethmoidal air cells and right maxillary sinus. Paranasal sinuses are otherwise clear. No mastoid effusion. Inner ear structures grossly normal.  Bone marrow signal intensity within normal limits. No scalp soft tissue abnormality.  MRA HEAD FINDINGS  ANTERIOR CIRCULATION:  Study mildly degraded by motion artifact.  Visualized distal cervical segments of the internal carotid arteries are widely patent with antegrade flow. Petrous segments are widely patent. Atheromatous irregularity with associated moderate narrowing present within the distal cavernous/ supraclinoid ICAs bilaterally. Left A1 segment hypoplastic but patent. Right A1 segment widely patent. Anterior communicating artery and anterior cerebral arteries within normal limits. M1 segments patent without stenosis or occlusion. MCA  bifurcations normal. Distal MCA branches symmetric and well opacified.  POSTERIOR CIRCULATION:  Right vertebral artery is diminutive and attenuated to the level of the vertebrobasilar junction. Dominant left vertebral artery widely patent. Left posterior inferior cerebral artery is patent proximally. Right posterior inferior cerebral artery not well visualized. Basilar artery well opacified. Irregularity at the mid aspect of the basilar artery favored to be related to motion artifact. Superior cerebellar arteries well opacified. Both posterior cerebral arteries arise from the basilar artery. There is a short-segment moderate to severe stenosis within the left P1 segment. Posterior cerebral arteries otherwise well opacified.  IMPRESSION: MRI HEAD IMPRESSION:  1. No acute intracranial infarct or other abnormality identified. 2. Generalized cerebral atrophy with mild chronic small vessel ischemic disease.  MRA HEAD IMPRESSION:  1. No large or proximal arterial branch occlusion within the intracranial circulation. 2. Attenuated flow within the diminutive right vertebral artery. Dominant left vertebral artery widely patent to the vertebrobasilar junction. 3. Short-segment moderate to severe nonocclusive stenosis within the left P1 segment. 4. Atheromatous irregularity with multifocal moderate narrowing of the distal cavernous/ supraclinoid  ICAs bilaterally.   Electronically Signed   By: Jeannine Boga M.D.   On: 09/30/2014 23:29   Nm Myocar Multi W/spect W/wall Motion / Ef  10/03/2014    There was no ST segment deviation noted during stress.  This is an intermediate risk study.  The left ventricular ejection fraction is moderately decreased (30-44%).   1. Medium-sized, partially reversible basal to mid inferolateral perfusion  defect.  This suggests possible area of infarction with peri-infarct  ischemia.   2. EF 30% with the appearance of septal dyskinesis.  3. Intermediate risk study.      PERTINENT LAB  RESULTS: CBC:  Recent Labs  10/02/14 0730 10/04/14 1345  WBC 6.0 6.6  HGB 11.6* 10.7*  HCT 36.3* 32.8*  PLT 183 160   CMET CMP     Component Value Date/Time   NA 134* 10/04/2014 1345   NA 144 04/02/2014 1540   K 4.0 10/04/2014 1345   CL 98* 10/04/2014 1345   CO2 26 10/04/2014 1345   GLUCOSE 123* 10/04/2014 1345   GLUCOSE 61* 04/02/2014 1540   BUN 38* 10/04/2014 1345   BUN 45* 04/02/2014 1540   CREATININE 6.16* 10/04/2014 1345   CALCIUM 9.0 10/04/2014 1345   PROT 6.7 09/30/2014 2054   PROT 6.4 04/02/2014 1540   ALBUMIN 3.0* 10/04/2014 1345   AST 16 09/30/2014 2054   ALT 8* 09/30/2014 2054   ALKPHOS 70 09/30/2014 2054   BILITOT 0.6 09/30/2014 2054   BILITOT <0.2 04/02/2014 1540   GFRNONAA 8* 10/04/2014 1345   GFRAA 9* 10/04/2014 1345    GFR Estimated Creatinine Clearance: 11.2 mL/min (by C-G formula based on Cr of 6.16). No results for input(s): LIPASE, AMYLASE in the last 72 hours. No results for input(s): CKTOTAL, CKMB, CKMBINDEX, TROPONINI in the last 72 hours. Invalid input(s): POCBNP No results for input(s): DDIMER in the last 72 hours. No results for input(s): HGBA1C in the last 72 hours. No results for input(s): CHOL, HDL, LDLCALC, TRIG, CHOLHDL, LDLDIRECT in the last 72 hours. No results for input(s): TSH, T4TOTAL, T3FREE, THYROIDAB in the last 72 hours.  Invalid input(s): FREET3 No results for input(s): VITAMINB12, FOLATE, FERRITIN, TIBC, IRON, RETICCTPCT in the last 72 hours. Coags: No results for input(s): INR in the last 72 hours.  Invalid input(s): PT Microbiology: Recent Results (from the past 240 hour(s))  Blood culture (routine x 2)     Status: None (Preliminary result)   Collection Time: 10/01/14  2:40 AM  Result Value Ref Range Status   Specimen Description BLOOD LEFT ANTECUBITAL  Final   Special Requests BOTTLES DRAWN AEROBIC AND ANAEROBIC 5CC  Final   Culture NO GROWTH 3 DAYS  Final   Report Status PENDING  Incomplete  Blood culture  (routine x 2)     Status: None (Preliminary result)   Collection Time: 10/01/14  2:45 AM  Result Value Ref Range Status   Specimen Description BLOOD LEFT HAND  Final   Special Requests BOTTLES DRAWN AEROBIC AND ANAEROBIC 5CC  Final   Culture NO GROWTH 3 DAYS  Final   Report Status PENDING  Incomplete     BRIEF HOSPITAL COURSE:  TIA - Right arm weakness/right facial weakness resolved. MRI Brain neg for cva, MRA Brain neg for significant stenosis. Echo negative for embolic source, Carotid doppler shows >80 % stenosis on left ICA. LDL is 117, A1c is 10.2.Continue ASA/Plavix, Pravachol increased to 40 mg daily. Evaluated by vascular surgery, plans are for carotid endarterectomy in the  next few weeks. Underwent pre-op cardiology evaluation-Nuc Stress test completed on 9/22-which was positive-subsequently underwent cardiac catheterization on 9/23 which showed severe native vessel disease-however patient is S/P CABG-in all the grafts were patent. Per cardiology-patient should be at low risk for cardiac complications for upcoming surgery.  Possible Dialysis-Associated Steal Syndrome - With numbness, weakness and tingling of RUE related to fistula. Vascular Surgery has been consulted, patient declines ligating current fistula and inserting one on left side. They recommend follow-up in 3-4 weeks to revisit this option   Recent sepsis from recent infected Port-A-Cath/PermCath removed on 8/30:growing gram-negative organism resistant to Cefzil and should complete a total of 3 weeks of Fortaz   CAD: Status post CABG. Underwent cardiac catheterization as part of preop evaluation which showed patency of all the grafts. Per cardiology (see cardiac cath note) low risk for upcoming surgery.  HTN - BPs have been stable, continued hold of amlodipine-specially in a setting of carotid artery stenosis.  Hyperlipidemia - as above  Type 2 Diabetes Mellitus with Diabetic Neuropathy - CBG's stable with Levemi 15 units  BID and SSI.Continue lyrica for neuropathy. A1C not at goal-will require better glycemic control long term.  ESRD on dialysis - Nephrology following  Chronic Diastolic UGQ:[id number]. Volume removal   Anemia of Chronic Renal Disease - Hb near baseline at 11.6, continue current regimen and monitor closely  Cervical disc disorder with radiculopathy of cervical region: prn Norco  GERD - Stable, continue pepcid prn  Prostate cancer: s/p surgery. No acute issues.On Flomax  TODAY-DAY OF DISCHARGE:  Subjective:   Aaron Long today has no headache,no chest abdominal pain,no new weakness tingling or numbness, feels much better wants to go home today.   Objective:   Blood pressure 115/55, pulse 75, temperature 98 F (36.7 C), temperature source Axillary, resp. rate 15, weight 84.8 kg (186 lb 15.2 oz), SpO2 98 %.  Intake/Output Summary (Last 24 hours) at 10/04/14 1546 Last data filed at 10/04/14 1457  Gross per 24 hour  Intake    240 ml  Output   -199 ml  Net    439 ml   Filed Weights   10/04/14 0506 10/04/14 1335 10/04/14 1457  Weight: 82.5 kg (181 lb 14.1 oz) 84.8 kg (186 lb 15.2 oz) 84.8 kg (186 lb 15.2 oz)    Exam Awake Alert, Oriented *3, No new F.N deficits, Normal affect [place].AT,PERRAL Supple Neck,No JVD, No cervical lymphadenopathy appriciated.  Symmetrical Chest wall movement, Good air movement bilaterally, CTAB RRR,No Gallops,Rubs or new Murmurs, No Parasternal Heave +ve B.Sounds, Abd Soft, Non tender, No organomegaly appriciated, No rebound -guarding or rigidity. No Cyanosis, Clubbing or edema, No new Rash or bruise  DISCHARGE CONDITION: Stable  DISPOSITION: Home with home health services  DISCHARGE INSTRUCTIONS:    Activity:  As tolerated with Full fall precautions use walker/cane & assistance as needed  Get Medicines reviewed and adjusted: Please take all your medications with you for your next visit with your Primary MD  Please request your Primary MD  to go over all hospital tests and procedure/radiological results at the follow up, please ask your Primary MD to get all Hospital records sent to his/her office.  If you experience worsening of your admission symptoms, develop shortness of breath, life threatening emergency, suicidal or homicidal thoughts you must seek medical attention immediately by calling 911 or calling your MD immediately  if symptoms less severe.  You must read complete instructions/literature along with all the possible adverse reactions/side effects for all the  Medicines you take and that have been prescribed to you. Take any new Medicines after you have completely understood and accpet all the possible adverse reactions/side effects.   Do not drive when taking Pain medications.   Do not take more than prescribed Pain, Sleep and Anxiety Medications  Special Instructions: If you have smoked or chewed Tobacco  in the last 2 yrs please stop smoking, stop any regular Alcohol  and or any Recreational drug use.  Wear Seat belts while driving.  Please note  You were cared for by a hospitalist during your hospital stay. Once you are discharged, your primary care physician will handle any further medical issues. Please note that NO REFILLS for any discharge medications will be authorized once you are discharged, as it is imperative that you return to your primary care physician (or establish a relationship with a primary care physician if you do not have one) for your aftercare needs so that they can reassess your need for medications and monitor your lab values.   Diet recommendation: Diabetic Diet Heart Healthy diet Renal Diet  Discharge Instructions    Ambulatory referral to Neurology    Complete by:  As directed   An appointment is requested in approximately: 8 weeks     Diet - low sodium heart healthy    Complete by:  As directed      Diet Carb Modified    Complete by:  As directed      Increase activity slowly     Complete by:  As directed            Follow-up Information    Follow up with Dewaine Oats, JESSICA K, NP. Schedule an appointment as soon as possible for a visit in 2 weeks.   Specialty:  Nurse Practitioner   Contact information:   [place]. [place] [address] [phone]       Follow up with [name], MD. Schedule an appointment as soon as possible for a visit in 1 week.   Specialties:  Vascular Surgery, Interventional Cardiology, Cardiology   Contact information:   [address] [place] [address] [phone]       Please follow up.   Contact information:   Hemodialysis center-at your usual schedule.       Follow up with Antony Contras, MD.   Specialties:  Neurology, Radiology   Why:  Office will call you with an appointment-if you don't hear from them-please give them a call   Contact information:   [place] [address] [phone]       Total Time spent on discharge equals  45 minutes.  SignedOren Binet 10/04/2014 3:46 PM

## 2014-10-04 ENCOUNTER — Encounter (HOSPITAL_COMMUNITY)
Admission: EM | Disposition: A | Discharge status: Home / Self Care | Payer: Self-pay | Source: Home / Self Care | Attending: Emergency Medicine

## 2014-10-04 ENCOUNTER — Telehealth: Payer: Self-pay | Admitting: Vascular Surgery

## 2014-10-04 ENCOUNTER — Telehealth: Payer: Self-pay

## 2014-10-04 ENCOUNTER — Encounter (HOSPITAL_COMMUNITY): Payer: Self-pay | Admitting: Cardiovascular Disease

## 2014-10-04 DIAGNOSIS — G459 Transient cerebral ischemic attack, unspecified: Secondary | ICD-10-CM | POA: Diagnosis not present

## 2014-10-04 DIAGNOSIS — I120 Hypertensive chronic kidney disease with stage 5 chronic kidney disease or end stage renal disease: Secondary | ICD-10-CM | POA: Diagnosis not present

## 2014-10-04 DIAGNOSIS — D631 Anemia in chronic kidney disease: Secondary | ICD-10-CM | POA: Diagnosis not present

## 2014-10-04 DIAGNOSIS — Z992 Dependence on renal dialysis: Secondary | ICD-10-CM | POA: Diagnosis not present

## 2014-10-04 DIAGNOSIS — I6529 Occlusion and stenosis of unspecified carotid artery: Secondary | ICD-10-CM

## 2014-10-04 DIAGNOSIS — I2510 Atherosclerotic heart disease of native coronary artery without angina pectoris: Secondary | ICD-10-CM | POA: Diagnosis not present

## 2014-10-04 DIAGNOSIS — N189 Chronic kidney disease, unspecified: Secondary | ICD-10-CM | POA: Diagnosis not present

## 2014-10-04 DIAGNOSIS — N186 End stage renal disease: Secondary | ICD-10-CM | POA: Diagnosis not present

## 2014-10-04 HISTORY — PX: CARDIAC CATHETERIZATION: SHX172

## 2014-10-04 LAB — GLUCOSE, CAPILLARY
GLUCOSE-CAPILLARY: 104 mg/dL — AB (ref 65–99)
GLUCOSE-CAPILLARY: 132 mg/dL — AB (ref 65–99)
Glucose-Capillary: 73 mg/dL (ref 65–99)

## 2014-10-04 LAB — CBC
HCT: 32.8 % — ABNORMAL LOW (ref 39.0–52.0)
Hemoglobin: 10.7 g/dL — ABNORMAL LOW (ref 13.0–17.0)
MCH: 31.7 pg (ref 26.0–34.0)
MCHC: 32.6 g/dL (ref 30.0–36.0)
MCV: 97 fL (ref 78.0–100.0)
Platelets: 160 K/uL (ref 150–400)
RBC: 3.38 MIL/uL — ABNORMAL LOW (ref 4.22–5.81)
RDW: 15.9 % — ABNORMAL HIGH (ref 11.5–15.5)
WBC: 6.6 10*3/uL (ref 4.0–10.5)

## 2014-10-04 LAB — RENAL FUNCTION PANEL
Albumin: 3 g/dL — ABNORMAL LOW (ref 3.5–5.0)
Anion gap: 10 (ref 5–15)
BUN: 38 mg/dL — ABNORMAL HIGH (ref 6–20)
CO2: 26 mmol/L (ref 22–32)
Calcium: 9 mg/dL (ref 8.9–10.3)
Chloride: 98 mmol/L — ABNORMAL LOW (ref 101–111)
Creatinine, Ser: 6.16 mg/dL — ABNORMAL HIGH (ref 0.61–1.24)
GFR calc Af Amer: 9 mL/min — ABNORMAL LOW (ref >60)
GFR calc non Af Amer: 8 mL/min — ABNORMAL LOW (ref >60)
Glucose, Bld: 123 mg/dL — ABNORMAL HIGH (ref 65–99)
Phosphorus: 5.8 mg/dL — ABNORMAL HIGH (ref 2.5–4.6)
Potassium: 4 mmol/L (ref 3.5–5.1)
Sodium: 134 mmol/L — ABNORMAL LOW (ref 135–145)

## 2014-10-04 SURGERY — LEFT HEART CATH AND CORONARY ANGIOGRAPHY
Anesthesia: LOCAL

## 2014-10-04 MED ORDER — IOHEXOL 350 MG/ML SOLN
INTRAVENOUS | Status: DC | PRN
  Administered 2014-10-04: 75 mL via INTRA_ARTERIAL

## 2014-10-04 MED ORDER — PENTAFLUOROPROP-TETRAFLUOROETH EX AERO: 1.0000 "application " | INHALATION_SPRAY | CUTANEOUS | Status: DC | PRN

## 2014-10-04 MED ORDER — HEPARIN SODIUM (PORCINE) 1000 UNIT/ML DIALYSIS
8500.0000 [IU] | Freq: Once | INTRAMUSCULAR | Status: DC
  Filled 2014-10-04: qty 9

## 2014-10-04 MED ORDER — MIDAZOLAM HCL 2 MG/2ML IJ SOLN
INTRAMUSCULAR | Status: DC | PRN
  Administered 2014-10-04: 1 mg via INTRAVENOUS

## 2014-10-04 MED ORDER — ONDANSETRON HCL 4 MG/2ML IJ SOLN: 4.0000 mg | Freq: Four times a day (QID) | INTRAMUSCULAR | Status: DC | PRN

## 2014-10-04 MED ORDER — LIDOCAINE-PRILOCAINE 2.5-2.5 % EX CREA
1.0000 "application " | TOPICAL_CREAM | CUTANEOUS | Status: DC | PRN
  Filled 2014-10-04: qty 5

## 2014-10-04 MED ORDER — SODIUM CHLORIDE 0.9 % IV SOLN: 250.0000 mL | INTRAVENOUS | Status: DC | PRN

## 2014-10-04 MED ORDER — HEPARIN (PORCINE) IN NACL 2-0.9 UNIT/ML-% IJ SOLN
INTRAMUSCULAR | Status: AC
  Filled 2014-10-04: qty 1500

## 2014-10-04 MED ORDER — SODIUM CHLORIDE 0.9 % IV SOLN
INTRAVENOUS | Status: DC
Start: 2014-10-04 — End: 2014-10-04

## 2014-10-04 MED ORDER — LIDOCAINE HCL (PF) 1 % IJ SOLN
INTRAMUSCULAR | Status: AC
  Filled 2014-10-04: qty 30

## 2014-10-04 MED ORDER — HEPARIN SODIUM (PORCINE) 1000 UNIT/ML DIALYSIS
1000.0000 [IU] | INTRAMUSCULAR | Status: DC | PRN
  Filled 2014-10-04: qty 1

## 2014-10-04 MED ORDER — SODIUM CHLORIDE 0.9 % IJ SOLN: 3.0000 mL | Freq: Two times a day (BID) | INTRAMUSCULAR | Status: DC

## 2014-10-04 MED ORDER — LIDOCAINE HCL (PF) 1 % IJ SOLN: 5.0000 mL | INTRAMUSCULAR | Status: DC | PRN

## 2014-10-04 MED ORDER — ALTEPLASE 2 MG IJ SOLR
2.0000 mg | Freq: Once | INTRAMUSCULAR | Status: DC | PRN
  Filled 2014-10-04: qty 2

## 2014-10-04 MED ORDER — PRAVASTATIN SODIUM 20 MG PO TABS
40.0000 mg | ORAL_TABLET | Freq: Every day | ORAL | Status: DC
Start: 2014-10-04 — End: 2015-01-09

## 2014-10-04 MED ORDER — SODIUM CHLORIDE 0.9 % IJ SOLN: 3.0000 mL | INTRAMUSCULAR | Status: DC | PRN

## 2014-10-04 MED ORDER — ACETAMINOPHEN 325 MG PO TABS: 650.0000 mg | ORAL_TABLET | ORAL | Status: DC | PRN

## 2014-10-04 MED ORDER — MIDAZOLAM HCL 2 MG/2ML IJ SOLN
INTRAMUSCULAR | Status: AC
  Filled 2014-10-04: qty 4

## 2014-10-04 MED ORDER — LABETALOL HCL 5 MG/ML IV SOLN
10.0000 mg | INTRAVENOUS | Status: DC | PRN
  Administered 2014-10-04: 10 mg via INTRAVENOUS

## 2014-10-04 MED ORDER — LABETALOL HCL 5 MG/ML IV SOLN
INTRAVENOUS | Status: AC
  Filled 2014-10-04: qty 4

## 2014-10-04 MED ORDER — SODIUM CHLORIDE 0.9 % IV SOLN: 100.0000 mL | INTRAVENOUS | Status: DC | PRN

## 2014-10-04 MED ORDER — INSULIN DETEMIR 100 UNIT/ML ~~LOC~~ SOLN
10.0000 [IU] | Freq: Once | SUBCUTANEOUS | Status: AC
  Administered 2014-10-04: 10 [IU] via SUBCUTANEOUS
  Filled 2014-10-04: qty 0.1

## 2014-10-04 MED ORDER — LIDOCAINE HCL (PF) 1 % IJ SOLN
INTRAMUSCULAR | Status: DC | PRN
  Administered 2014-10-04: 09:00:00

## 2014-10-04 MED ORDER — LABETALOL HCL 5 MG/ML IV SOLN
INTRAVENOUS | Status: DC | PRN
Start: 2014-10-04 — End: 2014-10-04
  Administered 2014-10-04: 15 mg via INTRAVENOUS

## 2014-10-04 SURGICAL SUPPLY — 10 items
CATH EXPO 5F MPA-1 (CATHETERS) ×1 IMPLANT
CATH INFINITI 5 FR RCB (CATHETERS) ×1 IMPLANT
CATH INFINITI 5FR MULTPACK ANG (CATHETERS) ×1 IMPLANT
KIT HEART LEFT (KITS) ×2 IMPLANT
PACK CARDIAC CATHETERIZATION (CUSTOM PROCEDURE TRAY) ×2 IMPLANT
SHEATH PINNACLE 5F 10CM (SHEATH) ×1 IMPLANT
SYR MEDRAD MARK V 150ML (SYRINGE) ×2 IMPLANT
TRANSDUCER W/STOPCOCK (MISCELLANEOUS) ×2 IMPLANT
TUBING CIL FLEX 10 FLL-RA (TUBING) ×2 IMPLANT
WIRE EMERALD 3MM-J .035X150CM (WIRE) ×1 IMPLANT

## 2014-10-04 NOTE — H&P (View-Only) (Signed)
     SUBJECTIVE: No chest pain. No SOB  BP 158/71 mmHg  Pulse 80  Temp(Src) 97.8 F (36.6 C) (Oral)  Resp 20  Wt 181 lb 14.1 oz (82.5 kg)  SpO2 97%  Intake/Output Summary (Last 24 hours) at 10/04/14 8676 Last data filed at 10/03/14 2340  Gross per 24 hour  Intake    486 ml  Output    100 ml  Net    386 ml    PHYSICAL EXAM General: Well developed, well nourished, in no acute distress. Alert and oriented x 3.  Psych:  Good affect, responds appropriately  LABS: Basic Metabolic Panel:  Recent Labs  10/02/14 0730  NA 138  K 4.2  CL 101  CO2 26  GLUCOSE 108*  BUN 57*  CREATININE 7.41*  CALCIUM 9.5  PHOS 6.5*   CBC:  Recent Labs  10/02/14 0730  WBC 6.0  HGB 11.6*  HCT 36.3*  MCV 98.1  PLT 183   Current Meds: . aspirin  81 mg Oral Pre-Cath  . aspirin EC  81 mg Oral q morning - 10a  . calcium acetate  667 mg Oral TID WC  . ceftAZIDime (FORTAZ) IVPB 2 gram/50 mL D5W (Pyxis)  2 g Intravenous Q M,W,F-HD  . clopidogrel  75 mg Oral Daily  . doxercalciferol  5 mcg Intravenous Q M,W,F-HD  . enoxaparin (LOVENOX) injection  30 mg Subcutaneous Daily  . famotidine  20 mg Oral QHS  . ferric gluconate (FERRLECIT/NULECIT) IV  62.5 mg Intravenous Q Wed-HD  . insulin aspart  0-9 Units Subcutaneous TID WC  . insulin detemir  15 Units Subcutaneous BID  . multivitamin  1 tablet Oral QHS  . pravastatin  40 mg Oral q1800  . pregabalin  75 mg Oral Daily  . QUEtiapine  25 mg Oral QHS  . regadenoson  0.4 mg Intravenous Once  . senna-docusate  2 tablet Oral BID  . sodium chloride  3 mL Intravenous Q12H  . tamsulosin  0.4 mg Oral Daily     ASSESSMENT AND PLAN:  Pt with history of CAD s/p CABG. Admitted with chest pain, syncope. Found to have high grade LICA stenosis. He was seen by cardiology on Wednesday for pre-op risk assessment. Stress myoview yesterday was an intermediate risk study with possible ischemia. Plan cardiac cath today with possible PCI. Can be discharged  home after cath unless PCI performed. CEA per [facility] next week. Risks and benefits of cath reviewed. Pt agrees to proceed. Labs reviewed and ok.     [name],[name]  9/23/20167:13 AM

## 2014-10-04 NOTE — Discharge Instructions (Signed)
STROKE/TIA DISCHARGE INSTRUCTIONS SMOKING Cigarette smoking nearly doubles your risk of having a stroke & is the single most alterable risk factor  If you smoke or have smoked in the last 12 months, you are advised to quit smoking for your health.  Most of the excess cardiovascular risk related to smoking disappears within a year of stopping.  Ask you doctor about anti-smoking medications  Juab Quit Line: 1-800-QUIT NOW  Free Smoking Cessation Classes [phone]  CHOLESTEROL Know your levels; limit fat & cholesterol in your diet  Lipid Panel     Component Value Date/Time   CHOL 178 10/01/2014 0148   CHOL 147 04/02/2014 1540   TRIG 158* 10/01/2014 0148   HDL 29* 10/01/2014 0148   HDL 36* 04/02/2014 1540   CHOLHDL 6.1 10/01/2014 0148   CHOLHDL 4.1 04/02/2014 1540   VLDL 32 10/01/2014 0148   LDLCALC 117* 10/01/2014 0148   LDLCALC 97 04/02/2014 1540      Many patients benefit from treatment even if their cholesterol is at goal.  Goal: Total Cholesterol (CHOL) less than 160  Goal:  Triglycerides (TRIG) less than 150  Goal:  HDL greater than 40  Goal:  LDL (LDLCALC) less than 100   BLOOD PRESSURE American Stroke Association blood pressure target is less that 120/80 mm/Hg  Your discharge blood pressure is:  BP: (!) 115/55 mmHg  Monitor your blood pressure  Limit your salt and alcohol intake  Many individuals will require more than one medication for high blood pressure  DIABETES (A1c is a blood sugar average for last 3 months) Goal HGBA1c is under 7% (HBGA1c is blood sugar average for last 3 months)  Diabetes: Diagnosis of diabetes:  {STROKE DC Q7R:91638}    Lab Results  Component Value Date   HGBA1C 10.2* 10/01/2014     Your HGBA1c can be lowered with medications, healthy diet, and exercise.  Check your blood sugar as directed by your physician  Call your physician if you experience unexplained or low blood sugars.  PHYSICAL ACTIVITY/REHABILITATION Goal is 30  minutes at least 4 days per week  Activity: {STROKE DC ACTIVITIES:22360} Therapies: {STROKE DC THERAPIES:22361} Return to work: ***  Activity decreases your risk of heart attack and stroke and makes your heart stronger.  It helps control your weight and blood pressure; helps you relax and can improve your mood.  Participate in a regular exercise program.  Talk with your doctor about the best form of exercise for you (dancing, walking, swimming, cycling).  DIET/WEIGHT Goal is to maintain a healthy weight  Your discharge diet is: Diet - low sodium heart healthy Diet Carb Modified Diet clear liquid Room service appropriate?: Yes; Fluid consistency:: Thin *** liquids Your height is:    Your current weight is: Weight: 84.8 kg (186 lb 15.2 oz) Your Body Mass Index (BMI) is:     Following the type of diet specifically designed for you will help prevent another stroke.  Your goal weight range is:  ***  Your goal Body Mass Index (BMI) is 19-24.  Healthy food habits can help reduce 3 risk factors for stroke:  High cholesterol, hypertension, and excess weight.  RESOURCES Stroke/Support Group:  Call [phone]   STROKE EDUCATION PROVIDED/REVIEWED AND GIVEN TO PATIENT Stroke warning signs and symptoms How to activate emergency medical system (call 911). Medications prescribed at discharge. Need for follow-up after discharge. Personal risk factors for stroke. Pneumonia vaccine given: {STROKE DC YES/NO/DATE:22363} Flu vaccine given: {STROKE DC YES/NO/DATE:22363} My questions have been  answered, the writing is legible, and I understand these instructions.  I will adhere to these goals & educational materials that have been provided to me after my discharge from the hospital.

## 2014-10-04 NOTE — Progress Notes (Signed)
OT Cancellation Note  Patient Details Name: Aaron Long MRN: [id number] DOB: Jul 20, 1940   Cancelled Treatment:    Reason Eval/Treat Not Completed: Patient at procedure or test/ unavailable (Pt in cath lab.)  [name] 10/04/2014, 11:23 AM

## 2014-10-04 NOTE — Progress Notes (Signed)
Hemodialysis- After 30 minutes on HD arterial pressure high. Unable to reposition needle. Attempted to restick higher without success (pt complaint of too much pain "that high"). Needle pulled. Attempted to recannulate 2 other times by another RN without success. Pt remains bruised/mildly swollen from what appears to be old infiltration. Notified PA. Will pull needles and return to room.

## 2014-10-04 NOTE — Progress Notes (Signed)
     SUBJECTIVE: No chest pain. No SOB  BP 158/71 mmHg  Pulse 80  Temp(Src) 97.8 F (36.6 C) (Oral)  Resp 20  Wt 181 lb 14.1 oz (82.5 kg)  SpO2 97%  Intake/Output Summary (Last 24 hours) at 10/04/14 8413 Last data filed at 10/03/14 2340  Gross per 24 hour  Intake    486 ml  Output    100 ml  Net    386 ml    PHYSICAL EXAM General: Well developed, well nourished, in no acute distress. Alert and oriented x 3.  Psych:  Good affect, responds appropriately  LABS: Basic Metabolic Panel:  Recent Labs  10/02/14 0730  NA 138  K 4.2  CL 101  CO2 26  GLUCOSE 108*  BUN 57*  CREATININE 7.41*  CALCIUM 9.5  PHOS 6.5*   CBC:  Recent Labs  10/02/14 0730  WBC 6.0  HGB 11.6*  HCT 36.3*  MCV 98.1  PLT 183   Current Meds: . aspirin  81 mg Oral Pre-Cath  . aspirin EC  81 mg Oral q morning - 10a  . calcium acetate  667 mg Oral TID WC  . ceftAZIDime (FORTAZ) IVPB 2 gram/50 mL D5W (Pyxis)  2 g Intravenous Q M,W,F-HD  . clopidogrel  75 mg Oral Daily  . doxercalciferol  5 mcg Intravenous Q M,W,F-HD  . enoxaparin (LOVENOX) injection  30 mg Subcutaneous Daily  . famotidine  20 mg Oral QHS  . ferric gluconate (FERRLECIT/NULECIT) IV  62.5 mg Intravenous Q Wed-HD  . insulin aspart  0-9 Units Subcutaneous TID WC  . insulin detemir  15 Units Subcutaneous BID  . multivitamin  1 tablet Oral QHS  . pravastatin  40 mg Oral q1800  . pregabalin  75 mg Oral Daily  . QUEtiapine  25 mg Oral QHS  . regadenoson  0.4 mg Intravenous Once  . senna-docusate  2 tablet Oral BID  . sodium chloride  3 mL Intravenous Q12H  . tamsulosin  0.4 mg Oral Daily     ASSESSMENT AND PLAN:  Pt with history of CAD s/p CABG. Admitted with chest pain, syncope. Found to have high grade LICA stenosis. He was seen by cardiology on Wednesday for pre-op risk assessment. Stress myoview yesterday was an intermediate risk study with possible ischemia. Plan cardiac cath today with possible PCI. Can be discharged  home after cath unless PCI performed. CEA per [facility] next week. Risks and benefits of cath reviewed. Pt agrees to proceed. Labs reviewed and ok.     [name],[name]  9/23/20167:13 AM

## 2014-10-04 NOTE — Interval H&P Note (Signed)
History and Physical Interval Note:  10/04/2014 8:18 AM  Aaron Long  has presented today for surgery, with the diagnosis of abnormal myoview, cad  The various methods of treatment have been discussed with the patient and family. After consideration of risks, benefits and other options for treatment, the patient has consented to  Procedure(s): Left Heart Cath and Coronary Angiography (N/A) as a surgical intervention .  [name] history has been reviewed, patient examined, no change in status, stable for surgery.  I have reviewed the patient's chart and labs.  Questions were answered to the patient's satisfaction.     Sherren Mocha

## 2014-10-04 NOTE — Progress Notes (Signed)
Pt c/o of constipation, no BM since Friday last week, NP Chaney Malling (on call) paged and notified at 2300, ordered a fleet enema same given at 2321 with good effect, also pt's CBG read 104 tonight, scheduled for 15units of Levemir tonight and NPO after Midnight, Ms Hilbert Bible also notified who changed dose of levemir to 10units for tonight with a small snack given, pt reassured, will however continue to monitor. Obasogie-Asidi, Philomena Efe

## 2014-10-04 NOTE — Telephone Encounter (Signed)
Please make sure pt follows up within 2 weeks     ----- Message -----    From: Jonetta Osgood, MD    Sent: 10/04/2014 10:54 AM     To: [name], NP    Spoke with patient's wife, patient is still in the hospital. Patient will be released from the hospital today and is scheduled to have another surgery in the near future. Patient's wife will surly call to schedule appointment once patient is released and has enough strength to come in .

## 2014-10-04 NOTE — Progress Notes (Signed)
Pt is gone to cardiac catheterization and will try to see later if time and pt can permit.  Mee Hives, PT MS Acute Rehab Dept. Number: [facility] [phone] and [place] [phone]

## 2014-10-04 NOTE — Progress Notes (Signed)
Subjective:  Post card cath in [facility] drowsy  But no cos aware of hd and  Dc after with plans  Today and for [facility] carotid surgery next week  Objective Vital signs in last 24 hours: Filed Vitals:   10/04/14 1050 10/04/14 1105 10/04/14 1120 10/04/14 1150  BP: 151/58 100/43 126/48 159/102  Pulse: 67 72 72 74  Temp:      TempSrc:      Resp: 9 21 11 11   Weight:      SpO2: 99% 99% 99% 98%   Weight change: 0.2 kg (7.1 oz)  Physical Exam: General: Lethargic/Drowsy sp card cath procedure, but recognizes me from OP HD center,NAD Lungs -clear to auscultation, dec'd air movement bilat Heart - RRR, SEM Gr2/6 Abdomen - obese, pos bs, soft, NT, ND Extrem - atrophy R hand,   -Bilat BKA Dialysis  access - pos bruit RLA AVF   OP Dialysis = at [facility] on MWF. EDW 81.5 kg. HD Bath 2K, 2.25 Ca, Dialyzer 180NR, Heparin 8500. Access R FA AVF  Problem / plan : 1.TIA Preexisting CVD.workup revealing >80 % stenosis on left ICA.with plans to .Continue ASA/Plavix,^  Pravachol increased to 40 mg daily. Evaluated by vascular surgery, plans are for carotid endarterectomy in the next few weeks. Also  Card eval with pos. Stress test  Then today= cardiac catheterization "" showed severe native vessel disease-however patient is S/P CABG-in all the grafts were patent. Per cardiology-patient should be at low risk for cardiac complications for upcoming surgery.""   Plans for dc home today  2 ESRD: plan HD today MWF ( [facility] )  3 Hypertension: bp ok off Amlodipine, keep off, avoid low bp 4. Anemia of ESRD:  hgb 11.6 and off ESA 5. Metabolic Bone Disease: cont Vit D and binders with 6.5 phos. 6 PVD  7 Steal syndrome R hand- sig steal w dec'd grip, VVS consulted and will f/u in OP setting 8 CAD 9 Prostate Ca 10 ^ lipids 11 DM needs better Control 12 GERD  Ernest Haber, PA-C [place] [phone] 10/04/2014,12:18 PM   Pt seen, examined, agree w assess/plan as above. Kelly Splinter  MD pager [phone]    cell [phone] 10/04/2014, 3:44 PM     Labs: Basic Metabolic Panel:  Recent Labs Lab 09/30/14 2054 09/30/14 2104 10/02/14 0730  NA 138 139 138  K 4.4 4.3 4.2  CL 96* 97* 101  CO2 30  --  26  GLUCOSE 146* 146* 108*  BUN 35* 38* 57*  CREATININE 5.71* 5.20* 7.41*  CALCIUM 8.9  --  9.5  PHOS  --   --  6.5*   Liver Function Tests:  Recent Labs Lab 09/30/14 2054 10/02/14 0730  AST 16  --   ALT 8*  --   ALKPHOS 70  --   BILITOT 0.6  --   PROT 6.7  --   ALBUMIN 3.2* 2.9*   No results for input(s): LIPASE, AMYLASE in the last 168 hours. No results for input(s): AMMONIA in the last 168 hours. CBC:  Recent Labs Lab 09/30/14 2054 09/30/14 2104 10/02/14 0730  WBC 5.6  --  6.0  NEUTROABS 2.5  --   --   HGB 11.1* 12.9* 11.6*  HCT 35.1* 38.0* 36.3*  MCV 98.6  --  98.1  PLT 207  --  183   Cardiac Enzymes: No results for input(s): CKTOTAL, CKMB, CKMBINDEX, TROPONINI in the last 168 hours. CBG:  Recent Labs Lab 10/03/14 1344 10/03/14  1604 10/03/14 2122 10/04/14 0633 10/04/14 0930  GLUCAP 170* 239* 104* 132* 73    Studies/Results: Nm Myocar Multi W/spect W/wall Motion / Ef  10/03/2014    There was no ST segment deviation noted during stress.  This is an intermediate risk study.  The left ventricular ejection fraction is moderately decreased (30-44%).   1. Medium-sized, partially reversible basal to mid inferolateral perfusion  defect.  This suggests possible area of infarction with peri-infarct  ischemia.   2. EF 30% with the appearance of septal dyskinesis.  3. Intermediate risk study.    Medications:   . aspirin EC  81 mg Oral q morning - 10a  . calcium acetate  667 mg Oral TID WC  . ceftAZIDime (FORTAZ) IVPB 2 gram/50 mL D5W (Pyxis)  2 g Intravenous Q M,W,F-HD  . clopidogrel  75 mg Oral Daily  . doxercalciferol  5 mcg Intravenous Q M,W,F-HD  . enoxaparin (LOVENOX) injection  30 mg Subcutaneous Daily  . famotidine  20 mg Oral QHS   . ferric gluconate (FERRLECIT/NULECIT) IV  62.5 mg Intravenous Q Wed-HD  . insulin aspart  0-9 Units Subcutaneous TID WC  . insulin detemir  15 Units Subcutaneous BID  . multivitamin  1 tablet Oral QHS  . pravastatin  40 mg Oral q1800  . pregabalin  75 mg Oral Daily  . QUEtiapine  25 mg Oral QHS  . regadenoson  0.4 mg Intravenous Once  . senna-docusate  2 tablet Oral BID  . sodium chloride  3 mL Intravenous Q12H  . tamsulosin  0.4 mg Oral Daily

## 2014-10-04 NOTE — Telephone Encounter (Signed)
LM for pt re appt, dpm °

## 2014-10-04 NOTE — Progress Notes (Signed)
Noted bilateral BKA

## 2014-10-04 NOTE — Progress Notes (Signed)
Site area: left groin fa sheath Site Prior to Removal:  Level   0 Pressure Applied For:  20 minutes Manual:   yes Patient Status During Pull:  stable Post Pull Site:  Level   0 Post Pull Instructions Given:  yes Post Pull Pulses Present: *bilateral BKA* Dressing Applied:  tegaderm Bedrest begins @  6378 Comments:  IV saline locked

## 2014-10-04 NOTE — Telephone Encounter (Signed)
-----   Message from Mena Goes, RN sent at 10/02/2014  5:20 PM EDT ----- Regarding: Schedule   ----- Message -----    From: Mal Misty, MD    Sent: 10/02/2014   3:44 PM      To: Vvs Charge Pool  10/02/2014 Hospital consults same and Dr. Kellie Simmering Diagnosis steal syndrome right upper extremity following radialcephalic AV fistula in patient with end-stage renal disease  Patient needs appointment to see me in the office in 3-4 weeks for follow-up

## 2014-10-05 DIAGNOSIS — E119 Type 2 diabetes mellitus without complications: Secondary | ICD-10-CM | POA: Diagnosis not present

## 2014-10-05 DIAGNOSIS — N186 End stage renal disease: Secondary | ICD-10-CM | POA: Diagnosis not present

## 2014-10-05 DIAGNOSIS — T8249XA Other complication of vascular dialysis catheter, initial encounter: Secondary | ICD-10-CM | POA: Diagnosis not present

## 2014-10-05 DIAGNOSIS — D509 Iron deficiency anemia, unspecified: Secondary | ICD-10-CM | POA: Diagnosis not present

## 2014-10-05 DIAGNOSIS — E214 Other specified disorders of parathyroid gland: Secondary | ICD-10-CM | POA: Diagnosis not present

## 2014-10-05 DIAGNOSIS — D631 Anemia in chronic kidney disease: Secondary | ICD-10-CM | POA: Diagnosis not present

## 2014-10-06 LAB — CULTURE, BLOOD (ROUTINE X 2)
CULTURE: NO GROWTH
CULTURE: NO GROWTH

## 2014-10-07 ENCOUNTER — Other Ambulatory Visit: Payer: Self-pay

## 2014-10-07 DIAGNOSIS — D631 Anemia in chronic kidney disease: Secondary | ICD-10-CM | POA: Diagnosis not present

## 2014-10-07 DIAGNOSIS — N186 End stage renal disease: Secondary | ICD-10-CM | POA: Diagnosis not present

## 2014-10-07 DIAGNOSIS — D509 Iron deficiency anemia, unspecified: Secondary | ICD-10-CM | POA: Diagnosis not present

## 2014-10-07 DIAGNOSIS — E214 Other specified disorders of parathyroid gland: Secondary | ICD-10-CM | POA: Diagnosis not present

## 2014-10-07 DIAGNOSIS — E119 Type 2 diabetes mellitus without complications: Secondary | ICD-10-CM | POA: Diagnosis not present

## 2014-10-07 DIAGNOSIS — T8249XA Other complication of vascular dialysis catheter, initial encounter: Secondary | ICD-10-CM | POA: Diagnosis not present

## 2014-10-09 DIAGNOSIS — D631 Anemia in chronic kidney disease: Secondary | ICD-10-CM | POA: Diagnosis not present

## 2014-10-09 DIAGNOSIS — T8249XA Other complication of vascular dialysis catheter, initial encounter: Secondary | ICD-10-CM | POA: Diagnosis not present

## 2014-10-09 DIAGNOSIS — N186 End stage renal disease: Secondary | ICD-10-CM | POA: Diagnosis not present

## 2014-10-09 DIAGNOSIS — D509 Iron deficiency anemia, unspecified: Secondary | ICD-10-CM | POA: Diagnosis not present

## 2014-10-09 DIAGNOSIS — E119 Type 2 diabetes mellitus without complications: Secondary | ICD-10-CM | POA: Diagnosis not present

## 2014-10-09 DIAGNOSIS — E214 Other specified disorders of parathyroid gland: Secondary | ICD-10-CM | POA: Diagnosis not present

## 2014-10-11 DIAGNOSIS — N186 End stage renal disease: Secondary | ICD-10-CM | POA: Diagnosis not present

## 2014-10-11 DIAGNOSIS — D509 Iron deficiency anemia, unspecified: Secondary | ICD-10-CM | POA: Diagnosis not present

## 2014-10-11 DIAGNOSIS — Z992 Dependence on renal dialysis: Secondary | ICD-10-CM | POA: Diagnosis not present

## 2014-10-11 DIAGNOSIS — E1122 Type 2 diabetes mellitus with diabetic chronic kidney disease: Secondary | ICD-10-CM | POA: Diagnosis not present

## 2014-10-11 DIAGNOSIS — T8249XA Other complication of vascular dialysis catheter, initial encounter: Secondary | ICD-10-CM | POA: Diagnosis not present

## 2014-10-11 DIAGNOSIS — D631 Anemia in chronic kidney disease: Secondary | ICD-10-CM | POA: Diagnosis not present

## 2014-10-11 DIAGNOSIS — E119 Type 2 diabetes mellitus without complications: Secondary | ICD-10-CM | POA: Diagnosis not present

## 2014-10-11 DIAGNOSIS — E214 Other specified disorders of parathyroid gland: Secondary | ICD-10-CM | POA: Diagnosis not present

## 2014-10-14 DIAGNOSIS — D509 Iron deficiency anemia, unspecified: Secondary | ICD-10-CM | POA: Diagnosis not present

## 2014-10-14 DIAGNOSIS — N186 End stage renal disease: Secondary | ICD-10-CM | POA: Diagnosis not present

## 2014-10-14 DIAGNOSIS — D631 Anemia in chronic kidney disease: Secondary | ICD-10-CM | POA: Diagnosis not present

## 2014-10-14 DIAGNOSIS — E214 Other specified disorders of parathyroid gland: Secondary | ICD-10-CM | POA: Diagnosis not present

## 2014-10-15 ENCOUNTER — Encounter (HOSPITAL_COMMUNITY)
Admission: RE | Admit: 2014-10-15 | Discharge: 2014-10-15 | Disposition: A | Discharge status: Home / Self Care | Payer: Medicare Other | Source: Ambulatory Visit | Attending: Vascular Surgery | Admitting: Vascular Surgery

## 2014-10-15 ENCOUNTER — Encounter (HOSPITAL_COMMUNITY): Payer: Self-pay

## 2014-10-15 LAB — COMPREHENSIVE METABOLIC PANEL
ALBUMIN: 3.5 g/dL (ref 3.5–5.0)
ALK PHOS: 114 U/L (ref 38–126)
ALT: 11 U/L — ABNORMAL LOW (ref 17–63)
AST: 16 U/L (ref 15–41)
Anion gap: 11 (ref 5–15)
BILIRUBIN TOTAL: 0.4 mg/dL (ref 0.3–1.2)
BUN: 42 mg/dL — AB (ref 6–20)
CALCIUM: 9.7 mg/dL (ref 8.9–10.3)
CO2: 25 mmol/L (ref 22–32)
Chloride: 103 mmol/L (ref 101–111)
Creatinine, Ser: 5.91 mg/dL — ABNORMAL HIGH (ref 0.61–1.24)
GFR calc Af Amer: 10 mL/min — ABNORMAL LOW (ref >60)
GFR, EST NON AFRICAN AMERICAN: 8 mL/min — AB (ref >60)
GLUCOSE: 243 mg/dL — AB (ref 65–99)
POTASSIUM: 4.7 mmol/L (ref 3.5–5.1)
Sodium: 139 mmol/L (ref 135–145)
TOTAL PROTEIN: 7.4 g/dL (ref 6.5–8.1)

## 2014-10-15 LAB — URINE MICROSCOPIC-ADD ON

## 2014-10-15 LAB — CBC
HEMATOCRIT: 39.2 % (ref 39.0–52.0)
HEMOGLOBIN: 12.3 g/dL — AB (ref 13.0–17.0)
MCH: 31.2 pg (ref 26.0–34.0)
MCHC: 31.4 g/dL (ref 30.0–36.0)
MCV: 99.5 fL (ref 78.0–100.0)
Platelets: 278 10*3/uL (ref 150–400)
RBC: 3.94 MIL/uL — ABNORMAL LOW (ref 4.22–5.81)
RDW: 15.1 % (ref 11.5–15.5)
WBC: 8.6 10*3/uL (ref 4.0–10.5)

## 2014-10-15 LAB — URINALYSIS, ROUTINE W REFLEX MICROSCOPIC
Bilirubin Urine: NEGATIVE
Glucose, UA: >1000 mg/dL — AB
Ketones, ur: NEGATIVE mg/dL
LEUKOCYTES UA: NEGATIVE
NITRITE: NEGATIVE
SPECIFIC GRAVITY, URINE: 1.017 (ref 1.005–1.030)
UROBILINOGEN UA: 0.2 mg/dL (ref 0.0–1.0)
pH: 6 (ref 5.0–8.0)

## 2014-10-15 LAB — PREPARE RBC (CROSSMATCH)

## 2014-10-15 LAB — APTT: APTT: 28 s (ref 24–37)

## 2014-10-15 LAB — SURGICAL PCR SCREEN
MRSA, PCR: NEGATIVE
STAPHYLOCOCCUS AUREUS: NEGATIVE

## 2014-10-15 LAB — PROTIME-INR
INR: 1.07 (ref 0.00–1.49)
Prothrombin Time: 14.1 seconds (ref 11.6–15.2)

## 2014-10-15 LAB — GLUCOSE, CAPILLARY: Glucose-Capillary: 223 mg/dL — ABNORMAL HIGH (ref 65–99)

## 2014-10-15 NOTE — Pre-Procedure Instructions (Signed)
Aaron Long  10/15/2014      [facility] [address], [place] [address] [place] [address] Phone: [phone] Fax: [phone]  [place] [address] Phone: [phone] Fax: [phone]  [facility] [phone] [place], [place] [address] Phone: [phone] Fax: [phone]    Your procedure is scheduled on Thursday  10/17/14  Report to [facility] Admitting at 530 A.M.  Call this number if you have problems the morning of surgery:  [phone]   Remember:  Do not eat food or drink liquids after midnight.  Take these medicines the morning of surgery with A SIP OF WATER  : HYDROCODONE IF NEEDED, FLOMAX How to Manage Your Diabetes Before Surgery   Why is it important to control my blood sugar before and after surgery?   Improving blood sugar levels before and after surgery helps healing and can limit problems.  A way of improving blood sugar control is eating a healthy diet by:  - Eating less sugar and carbohydrates  - Increasing activity/exercise  - Talk with your doctor about reaching your blood sugar goals  High blood sugars (greater than 180 mg/dL) can raise your risk of infections and slow down your recovery so you will need to focus on controlling your diabetes during the weeks before surgery.  Make sure that the doctor who takes care of your diabetes knows about your planned surgery including the date and location.  How do I manage my blood sugars before surgery?   Check your blood sugar at least 4 times a day, 2 days before surgery to make sure that they are not too high or low.   Check your blood sugar the morning of your surgery when you wake up and every 2               hours until you get to the Short-Stay unit.  If your blood sugar is less  than 70 mg/dL, you will need to treat for low blood sugar by:  Treat a low blood sugar (less than 70 mg/dL) with 1/2 cup of clear juice (cranberry or apple), 4 glucose tablets, OR glucose gel.  Recheck blood sugar in 15 minutes after treatment (to make sure it is greater than 70 mg/dL).  If blood sugar is not greater than 70 mg/dL on re-check, call [phone] for further instructions.   Report your blood sugar to the Short-Stay nurse when you get to Short-Stay.  References:  [facility], 2007 "How to Manage your Diabetes Before and After Surgery".  What do I do about my diabetes medications?   Do not take oral diabetes medicines (pills) the morning of surgery.  THE NIGHT BEFORE SURGERY, take  5 units of NOVOLOG  Insulin AS USUAL BEFORE DINNER. LEVEMIR (DINNER/BEDTIME) TAKE 12 UNITS.    THE MORNING OF SURGERY, take  7 units of LEVEMIR  Insulin.  IF GLUCOSE GREATER THAN 220, MAY TAKE 2.5  UNITS NOVOLOG INSULIN.    Do not take other diabetes injectables the day of surgery including Byetta, Victoza, Bydureon, and Trulicity.    If your CBG is greater than 220 mg/dL, you may take 1/2 of your sliding scale (correction) dose of insulin.   For patients with "Insulin Pumps":  Contact your diabetes doctor for specific instructions before surgery.   Decrease basal insulin rates by  20% at midnight the night before surgery.  Note that if your surgery is planned to be longer than 2 hours, your insulin pump will be removed and intravenous (IV) insulin will be started and managed by the nurses and anesthesiologist.  You will be able to restart your insulin pump once you are awake and able to manage it.  Make sure to bring insulin pump supplies to the hospital with you in case your site needs to be changed.        Do not wear jewelry, make-up or nail polish.  Do not wear lotions, powders, or perfumes.  You may wear deodorant.  Do not shave 48 hours prior to  surgery.  Men may shave face and neck.  Do not bring valuables to the hospital.  [facility] is not responsible for any belongings or valuables.  Contacts, dentures or bridgework may not be worn into surgery.  Leave your suitcase in the car.  After surgery it may be brought to your room.  For patients admitted to the hospital, discharge time will be determined by your treatment team.  Patients discharged the day of surgery will not be allowed to drive home.   Name and phone number of your driver:    Special instructions:   [facility] [place] - Preparing for Surgery  Before surgery, you can play an important role.  Because skin is not sterile, your skin needs to be as free of germs as possible.  You can reduce the number of germs on you skin by washing with CHG (chlorahexidine gluconate) soap before surgery.  CHG is an antiseptic cleaner which kills germs and bonds with the skin to continue killing germs even after washing.  Please DO NOT use if you have an allergy to CHG or antibacterial soaps.  If your skin becomes reddened/irritated stop using the CHG and inform your nurse when you arrive at Short Stay.  Do not shave (including legs and underarms) for at least 48 hours prior to the first CHG shower.  You may shave your face.  Please follow these instructions carefully:   1.  Shower with CHG Soap the night before surgery and the                                morning of Surgery.  2.  If you choose to wash your hair, wash your hair first as usual with your       normal shampoo.  3.  After you shampoo, rinse your hair and body thoroughly to remove the                      Shampoo.  4.  Use CHG as you would any other liquid soap.  You can apply chg directly       to the skin and wash gently with scrungie or a clean washcloth.  5.  Apply the CHG Soap to your body ONLY FROM THE NECK DOWN.        Do not use on open wounds or open sores.  Avoid contact with your eyes,       ears, mouth and genitals  (private parts).  Wash genitals (private parts)       with your normal soap.  6.  Wash thoroughly, paying special attention to the area where your surgery        will be performed.  7.  Thoroughly rinse your  body with warm water from the neck down.  8.  DO NOT shower/wash with your normal soap after using and rinsing off       the CHG Soap.  9.  Pat yourself dry with a clean towel.            10.  Wear clean pajamas.            11.  Place clean sheets on your bed the night of your first shower and do not        sleep with pets.  Day of Surgery  Do not apply any lotions/deoderants the morning of surgery.  Please wear clean clothes to the hospital/surgery center.   Please read over the following fact sheets that you were given. Pain Booklet, Coughing and Deep Breathing, Blood Transfusion Information, MRSA Information and Surgical Site Infection Prevention

## 2014-10-15 NOTE — Progress Notes (Signed)
SPOKE WITH STEPHANIE AT OFFICE WHO STATED PATIENT SHOULD CONTINUE ASPIRIN AND PLAVIX.  PATIENT AND SPOUSE HAVE BEEN INSTRUCTED.

## 2014-10-16 DIAGNOSIS — E214 Other specified disorders of parathyroid gland: Secondary | ICD-10-CM | POA: Diagnosis not present

## 2014-10-16 DIAGNOSIS — D631 Anemia in chronic kidney disease: Secondary | ICD-10-CM | POA: Diagnosis not present

## 2014-10-16 DIAGNOSIS — N186 End stage renal disease: Secondary | ICD-10-CM | POA: Diagnosis not present

## 2014-10-16 DIAGNOSIS — D509 Iron deficiency anemia, unspecified: Secondary | ICD-10-CM | POA: Diagnosis not present

## 2014-10-16 LAB — HEMOGLOBIN A1C
Hgb A1c MFr Bld: 9.8 % — ABNORMAL HIGH (ref 4.8–5.6)
MEAN PLASMA GLUCOSE: 235 mg/dL

## 2014-10-16 MED ORDER — CHLORHEXIDINE GLUCONATE CLOTH 2 % EX PADS: 6.0000 | MEDICATED_PAD | Freq: Once | CUTANEOUS | Status: DC

## 2014-10-16 MED ORDER — SODIUM CHLORIDE 0.9 % IV SOLN
INTRAVENOUS | Status: DC
  Administered 2014-10-17 (×2): via INTRAVENOUS

## 2014-10-16 MED ORDER — DEXTROSE 5 % IV SOLN
1.5000 g | INTRAVENOUS | Status: AC
  Administered 2014-10-17: 1.5 g via INTRAVENOUS
  Filled 2014-10-16: qty 1.5

## 2014-10-16 NOTE — Progress Notes (Signed)
Huntley Dec, FNP called and informed of hgba1c results 9.8. Pt is for surgery tomorrow.

## 2014-10-17 ENCOUNTER — Inpatient Hospital Stay (HOSPITAL_COMMUNITY): Payer: Medicare Other | Admitting: Certified Registered Nurse Anesthetist

## 2014-10-17 ENCOUNTER — Encounter (HOSPITAL_COMMUNITY): Payer: Self-pay | Admitting: Certified Registered Nurse Anesthetist

## 2014-10-17 ENCOUNTER — Encounter (HOSPITAL_COMMUNITY)
Admission: RE | Disposition: A | Discharge status: Home / Self Care | Payer: Self-pay | Source: Ambulatory Visit | Attending: Vascular Surgery

## 2014-10-17 ENCOUNTER — Inpatient Hospital Stay (HOSPITAL_COMMUNITY)
Admission: RE | Admit: 2014-10-17 | Discharge: 2014-10-18 | DRG: 037 | Disposition: A | Discharge status: Home / Self Care | Payer: Medicare Other | Source: Ambulatory Visit | Attending: Vascular Surgery | Admitting: Vascular Surgery

## 2014-10-17 DIAGNOSIS — Z89512 Acquired absence of left leg below knee: Secondary | ICD-10-CM | POA: Diagnosis not present

## 2014-10-17 DIAGNOSIS — E1151 Type 2 diabetes mellitus with diabetic peripheral angiopathy without gangrene: Secondary | ICD-10-CM | POA: Diagnosis present

## 2014-10-17 DIAGNOSIS — Z89511 Acquired absence of right leg below knee: Secondary | ICD-10-CM | POA: Diagnosis not present

## 2014-10-17 DIAGNOSIS — I739 Peripheral vascular disease, unspecified: Secondary | ICD-10-CM | POA: Diagnosis not present

## 2014-10-17 DIAGNOSIS — K5900 Constipation, unspecified: Secondary | ICD-10-CM | POA: Diagnosis present

## 2014-10-17 DIAGNOSIS — E1122 Type 2 diabetes mellitus with diabetic chronic kidney disease: Secondary | ICD-10-CM | POA: Diagnosis present

## 2014-10-17 DIAGNOSIS — N186 End stage renal disease: Secondary | ICD-10-CM | POA: Diagnosis present

## 2014-10-17 DIAGNOSIS — I96 Gangrene, not elsewhere classified: Secondary | ICD-10-CM | POA: Diagnosis not present

## 2014-10-17 DIAGNOSIS — J029 Acute pharyngitis, unspecified: Secondary | ICD-10-CM | POA: Diagnosis not present

## 2014-10-17 DIAGNOSIS — I252 Old myocardial infarction: Secondary | ICD-10-CM

## 2014-10-17 DIAGNOSIS — I6522 Occlusion and stenosis of left carotid artery: Secondary | ICD-10-CM | POA: Diagnosis not present

## 2014-10-17 DIAGNOSIS — N2581 Secondary hyperparathyroidism of renal origin: Secondary | ICD-10-CM | POA: Diagnosis present

## 2014-10-17 DIAGNOSIS — E785 Hyperlipidemia, unspecified: Secondary | ICD-10-CM | POA: Diagnosis present

## 2014-10-17 DIAGNOSIS — Z951 Presence of aortocoronary bypass graft: Secondary | ICD-10-CM

## 2014-10-17 DIAGNOSIS — E1142 Type 2 diabetes mellitus with diabetic polyneuropathy: Secondary | ICD-10-CM | POA: Diagnosis present

## 2014-10-17 DIAGNOSIS — Z992 Dependence on renal dialysis: Secondary | ICD-10-CM | POA: Diagnosis not present

## 2014-10-17 DIAGNOSIS — Z87891 Personal history of nicotine dependence: Secondary | ICD-10-CM

## 2014-10-17 DIAGNOSIS — I2510 Atherosclerotic heart disease of native coronary artery without angina pectoris: Secondary | ICD-10-CM | POA: Diagnosis present

## 2014-10-17 DIAGNOSIS — Z8546 Personal history of malignant neoplasm of prostate: Secondary | ICD-10-CM | POA: Diagnosis not present

## 2014-10-17 DIAGNOSIS — Z8673 Personal history of transient ischemic attack (TIA), and cerebral infarction without residual deficits: Secondary | ICD-10-CM

## 2014-10-17 DIAGNOSIS — I120 Hypertensive chronic kidney disease with stage 5 chronic kidney disease or end stage renal disease: Secondary | ICD-10-CM | POA: Diagnosis present

## 2014-10-17 DIAGNOSIS — I6529 Occlusion and stenosis of unspecified carotid artery: Secondary | ICD-10-CM | POA: Diagnosis present

## 2014-10-17 DIAGNOSIS — Z86718 Personal history of other venous thrombosis and embolism: Secondary | ICD-10-CM

## 2014-10-17 HISTORY — PX: ENDARTERECTOMY: SHX5162

## 2014-10-17 LAB — POCT I-STAT 4, (NA,K, GLUC, HGB,HCT)
Glucose, Bld: 137 mg/dL — ABNORMAL HIGH (ref 65–99)
HCT: 38 % — ABNORMAL LOW (ref 39.0–52.0)
Hemoglobin: 12.9 g/dL — ABNORMAL LOW (ref 13.0–17.0)
Potassium: 4.4 mmol/L (ref 3.5–5.1)
Sodium: 139 mmol/L (ref 135–145)

## 2014-10-17 LAB — CREATININE, SERUM
CREATININE: 5.68 mg/dL — AB (ref 0.61–1.24)
GFR calc Af Amer: 10 mL/min — ABNORMAL LOW (ref >60)
GFR calc non Af Amer: 9 mL/min — ABNORMAL LOW (ref >60)

## 2014-10-17 LAB — CBC
HEMATOCRIT: 38.1 % — AB (ref 39.0–52.0)
HEMOGLOBIN: 12 g/dL — AB (ref 13.0–17.0)
MCH: 31.3 pg (ref 26.0–34.0)
MCHC: 31.5 g/dL (ref 30.0–36.0)
MCV: 99.5 fL (ref 78.0–100.0)
Platelets: 238 10*3/uL (ref 150–400)
RBC: 3.83 MIL/uL — AB (ref 4.22–5.81)
RDW: 15.6 % — ABNORMAL HIGH (ref 11.5–15.5)
WBC: 15.2 10*3/uL — ABNORMAL HIGH (ref 4.0–10.5)

## 2014-10-17 LAB — GLUCOSE, CAPILLARY
GLUCOSE-CAPILLARY: 321 mg/dL — AB (ref 65–99)
Glucose-Capillary: 121 mg/dL — ABNORMAL HIGH (ref 65–99)
Glucose-Capillary: 167 mg/dL — ABNORMAL HIGH (ref 65–99)

## 2014-10-17 SURGERY — ENDARTERECTOMY, CAROTID
Anesthesia: General | Laterality: Left

## 2014-10-17 MED ORDER — INSULIN ASPART 100 UNIT/ML FLEXPEN: 5.0000 [IU] | PEN_INJECTOR | Freq: Three times a day (TID) | SUBCUTANEOUS | Status: DC

## 2014-10-17 MED ORDER — ESMOLOL HCL 10 MG/ML IV SOLN
INTRAVENOUS | Status: DC | PRN
  Administered 2014-10-17: 20 mg via INTRAVENOUS
  Administered 2014-10-17: 50 mg via INTRAVENOUS
  Administered 2014-10-17: 30 mg via INTRAVENOUS

## 2014-10-17 MED ORDER — LIDOCAINE HCL 4 % MT SOLN
OROMUCOSAL | Status: DC | PRN
  Administered 2014-10-17: 4 mL via TOPICAL

## 2014-10-17 MED ORDER — SUCCINYLCHOLINE CHLORIDE 20 MG/ML IJ SOLN
INTRAMUSCULAR | Status: AC
Start: 2014-10-17 — End: 2014-10-17
  Filled 2014-10-17: qty 1

## 2014-10-17 MED ORDER — SODIUM CHLORIDE 0.9 % IV SOLN
INTRAVENOUS | Status: DC | PRN
  Administered 2014-10-17: 08:00:00

## 2014-10-17 MED ORDER — PHENYLEPHRINE HCL 10 MG/ML IJ SOLN
INTRAMUSCULAR | Status: DC | PRN
  Administered 2014-10-17: 40 ug via INTRAVENOUS
  Administered 2014-10-17: 80 ug via INTRAVENOUS
  Administered 2014-10-17: 40 ug via INTRAVENOUS
  Administered 2014-10-17: 80 ug via INTRAVENOUS
  Administered 2014-10-17: 40 ug via INTRAVENOUS
  Administered 2014-10-17: 80 ug via INTRAVENOUS

## 2014-10-17 MED ORDER — RENA-VITE PO TABS
1.0000 | ORAL_TABLET | Freq: Every day | ORAL | Status: DC
  Administered 2014-10-17: 1 via ORAL
  Filled 2014-10-17 (×2): qty 1

## 2014-10-17 MED ORDER — ROCURONIUM BROMIDE 100 MG/10ML IV SOLN
INTRAVENOUS | Status: DC | PRN
  Administered 2014-10-17: 50 mg via INTRAVENOUS

## 2014-10-17 MED ORDER — PROPOFOL 10 MG/ML IV BOLUS
INTRAVENOUS | Status: DC | PRN
  Administered 2014-10-17: 30 mg via INTRAVENOUS
  Administered 2014-10-17: 50 mg via INTRAVENOUS
  Administered 2014-10-17: 100 mg via INTRAVENOUS

## 2014-10-17 MED ORDER — PROTAMINE SULFATE 10 MG/ML IV SOLN
INTRAVENOUS | Status: DC | PRN
  Administered 2014-10-17: 50 mg via INTRAVENOUS

## 2014-10-17 MED ORDER — EPHEDRINE SULFATE 50 MG/ML IJ SOLN
INTRAMUSCULAR | Status: DC | PRN
  Administered 2014-10-17 (×2): 5 mg via INTRAVENOUS
  Administered 2014-10-17: 10 mg via INTRAVENOUS
  Administered 2014-10-17: 5 mg via INTRAVENOUS

## 2014-10-17 MED ORDER — SODIUM CHLORIDE 0.9 % IV SOLN
0.0125 ug/kg/min | INTRAVENOUS | Status: AC
  Administered 2014-10-17: .05 ug/kg/min via INTRAVENOUS
  Filled 2014-10-17: qty 2000

## 2014-10-17 MED ORDER — PANTOPRAZOLE SODIUM 40 MG PO TBEC
40.0000 mg | DELAYED_RELEASE_TABLET | Freq: Every day | ORAL | Status: DC
  Administered 2014-10-17: 40 mg via ORAL
  Filled 2014-10-17: qty 1

## 2014-10-17 MED ORDER — LABETALOL HCL 5 MG/ML IV SOLN
10.0000 mg | INTRAVENOUS | Status: DC | PRN
  Administered 2014-10-17: 5 mg via INTRAVENOUS
  Administered 2014-10-17: 10 mg via INTRAVENOUS
  Filled 2014-10-17 (×2): qty 4

## 2014-10-17 MED ORDER — LIDOCAINE HCL (CARDIAC) 20 MG/ML IV SOLN
INTRAVENOUS | Status: DC | PRN
  Administered 2014-10-17: 80 mg via INTRAVENOUS

## 2014-10-17 MED ORDER — HYDROMORPHONE HCL 1 MG/ML IJ SOLN
0.2500 mg | INTRAMUSCULAR | Status: DC | PRN
  Administered 2014-10-17: 0.5 mg via INTRAVENOUS
  Administered 2014-10-17: 0.25 mg via INTRAVENOUS

## 2014-10-17 MED ORDER — PREGABALIN 75 MG PO CAPS
75.0000 mg | ORAL_CAPSULE | Freq: Every day | ORAL | Status: DC
  Administered 2014-10-17: 75 mg via ORAL
  Filled 2014-10-17: qty 1

## 2014-10-17 MED ORDER — LABETALOL HCL 5 MG/ML IV SOLN
INTRAVENOUS | Status: DC | PRN
  Administered 2014-10-17: 10 mg via INTRAVENOUS
  Administered 2014-10-17 (×2): 5 mg via INTRAVENOUS

## 2014-10-17 MED ORDER — SODIUM CHLORIDE 0.9 % IV SOLN: 500.0000 mL | Freq: Once | INTRAVENOUS | Status: DC | PRN

## 2014-10-17 MED ORDER — DEXTROSE 5 % IV SOLN: 1.5000 g | Freq: Two times a day (BID) | INTRAVENOUS | Status: DC

## 2014-10-17 MED ORDER — TAMSULOSIN HCL 0.4 MG PO CAPS
0.4000 mg | ORAL_CAPSULE | Freq: Every day | ORAL | Status: DC
  Administered 2014-10-17: 0.4 mg via ORAL
  Filled 2014-10-17 (×2): qty 1

## 2014-10-17 MED ORDER — CLOPIDOGREL BISULFATE 75 MG PO TABS
75.0000 mg | ORAL_TABLET | Freq: Every day | ORAL | Status: DC
  Filled 2014-10-17: qty 1

## 2014-10-17 MED ORDER — QUETIAPINE FUMARATE 25 MG PO TABS
25.0000 mg | ORAL_TABLET | Freq: Every day | ORAL | Status: DC
Start: 2014-10-17 — End: 2014-10-18
  Administered 2014-10-17: 25 mg via ORAL
  Filled 2014-10-17 (×2): qty 1

## 2014-10-17 MED ORDER — HEPARIN SODIUM (PORCINE) 1000 UNIT/ML IJ SOLN
INTRAMUSCULAR | Status: DC | PRN
  Administered 2014-10-17: 6000 [IU] via INTRAVENOUS

## 2014-10-17 MED ORDER — ENOXAPARIN SODIUM 30 MG/0.3ML ~~LOC~~ SOLN
30.0000 mg | SUBCUTANEOUS | Status: DC
  Filled 2014-10-17 (×2): qty 0.3

## 2014-10-17 MED ORDER — GLYCOPYRROLATE 0.2 MG/ML IJ SOLN
INTRAMUSCULAR | Status: DC | PRN
  Administered 2014-10-17: .6 mg via INTRAVENOUS

## 2014-10-17 MED ORDER — PRAVASTATIN SODIUM 40 MG PO TABS
40.0000 mg | ORAL_TABLET | Freq: Every day | ORAL | Status: DC
  Administered 2014-10-17: 40 mg via ORAL
  Filled 2014-10-17 (×2): qty 1

## 2014-10-17 MED ORDER — INSULIN ASPART 100 UNIT/ML ~~LOC~~ SOLN
5.0000 [IU] | Freq: Three times a day (TID) | SUBCUTANEOUS | Status: DC
  Administered 2014-10-18: 5 [IU] via SUBCUTANEOUS

## 2014-10-17 MED ORDER — LABETALOL HCL 5 MG/ML IV SOLN
INTRAVENOUS | Status: AC
  Filled 2014-10-17: qty 4

## 2014-10-17 MED ORDER — HYDROCODONE-ACETAMINOPHEN 5-325 MG PO TABS
1.0000 | ORAL_TABLET | ORAL | Status: DC | PRN
Start: 2014-10-17 — End: 2014-10-18

## 2014-10-17 MED ORDER — LIDOCAINE HCL (CARDIAC) 20 MG/ML IV SOLN
INTRAVENOUS | Status: AC
  Filled 2014-10-17: qty 5

## 2014-10-17 MED ORDER — GUAIFENESIN-DM 100-10 MG/5ML PO SYRP: 15.0000 mL | ORAL_SOLUTION | ORAL | Status: DC | PRN

## 2014-10-17 MED ORDER — INSULIN DETEMIR 100 UNIT/ML ~~LOC~~ SOLN
15.0000 [IU] | Freq: Two times a day (BID) | SUBCUTANEOUS | Status: DC
  Administered 2014-10-17: 15 [IU] via SUBCUTANEOUS
  Filled 2014-10-17 (×3): qty 0.15

## 2014-10-17 MED ORDER — REMIFENTANIL HCL 1 MG IV SOLR
INTRAVENOUS | Status: DC | PRN
  Administered 2014-10-17: 8 ug via INTRAVENOUS
  Administered 2014-10-17: 80 ug via INTRAVENOUS

## 2014-10-17 MED ORDER — NEOSTIGMINE METHYLSULFATE 10 MG/10ML IV SOLN
INTRAVENOUS | Status: DC | PRN
  Administered 2014-10-17: 4 mg via INTRAVENOUS

## 2014-10-17 MED ORDER — POTASSIUM CHLORIDE CRYS ER 20 MEQ PO TBCR: 20.0000 meq | EXTENDED_RELEASE_TABLET | Freq: Every day | ORAL | Status: DC | PRN

## 2014-10-17 MED ORDER — SODIUM CHLORIDE 0.9 % IJ SOLN: 3.0000 mL | INTRAMUSCULAR | Status: DC | PRN

## 2014-10-17 MED ORDER — SODIUM CHLORIDE 0.9 % IV SOLN: 250.0000 mL | INTRAVENOUS | Status: DC | PRN

## 2014-10-17 MED ORDER — CEFUROXIME SODIUM 750 MG IJ SOLR
750.0000 mg | INTRAMUSCULAR | Status: DC
  Filled 2014-10-17: qty 750

## 2014-10-17 MED ORDER — FENTANYL CITRATE (PF) 250 MCG/5ML IJ SOLN
INTRAMUSCULAR | Status: AC
  Filled 2014-10-17: qty 5

## 2014-10-17 MED ORDER — PROPOFOL 10 MG/ML IV BOLUS
INTRAVENOUS | Status: AC
  Filled 2014-10-17: qty 20

## 2014-10-17 MED ORDER — INSULIN DETEMIR 100 UNIT/ML ~~LOC~~ SOLN: 15.0000 [IU] | Freq: Two times a day (BID) | SUBCUTANEOUS | Status: DC

## 2014-10-17 MED ORDER — LIDOCAINE HCL (PF) 1 % IJ SOLN
INTRAMUSCULAR | Status: AC
  Filled 2014-10-17: qty 30

## 2014-10-17 MED ORDER — CALCIUM ACETATE (PHOS BINDER) 667 MG PO CAPS
667.0000 mg | ORAL_CAPSULE | Freq: Three times a day (TID) | ORAL | Status: DC
  Administered 2014-10-17 – 2014-10-18 (×2): 667 mg via ORAL
  Filled 2014-10-17 (×5): qty 1

## 2014-10-17 MED ORDER — ALUM & MAG HYDROXIDE-SIMETH 200-200-20 MG/5ML PO SUSP: 15.0000 mL | ORAL | Status: DC | PRN

## 2014-10-17 MED ORDER — ASPIRIN EC 81 MG PO TBEC
81.0000 mg | DELAYED_RELEASE_TABLET | Freq: Every morning | ORAL | Status: DC
  Filled 2014-10-17: qty 1

## 2014-10-17 MED ORDER — MORPHINE SULFATE (PF) 2 MG/ML IV SOLN
2.0000 mg | INTRAVENOUS | Status: DC | PRN
  Administered 2014-10-17 (×2): 2 mg via INTRAVENOUS
  Filled 2014-10-17 (×2): qty 1

## 2014-10-17 MED ORDER — BISACODYL 10 MG RE SUPP
10.0000 mg | Freq: Every day | RECTAL | Status: DC | PRN
Start: 2014-10-17 — End: 2014-10-18

## 2014-10-17 MED ORDER — ONDANSETRON HCL 4 MG/2ML IJ SOLN
INTRAMUSCULAR | Status: AC
  Filled 2014-10-17: qty 2

## 2014-10-17 MED ORDER — HYDROMORPHONE HCL 1 MG/ML IJ SOLN
INTRAMUSCULAR | Status: AC
  Filled 2014-10-17: qty 1

## 2014-10-17 MED ORDER — PHENYLEPHRINE HCL 10 MG/ML IJ SOLN
10.0000 mg | INTRAVENOUS | Status: DC | PRN
  Administered 2014-10-17: 50 ug/min via INTRAVENOUS
  Administered 2014-10-17: 75 ug/min via INTRAVENOUS
  Administered 2014-10-17: 25 ug/min via INTRAVENOUS

## 2014-10-17 MED ORDER — SENNOSIDES-DOCUSATE SODIUM 8.6-50 MG PO TABS
2.0000 | ORAL_TABLET | Freq: Two times a day (BID) | ORAL | Status: DC
  Administered 2014-10-17: 2 via ORAL
  Filled 2014-10-17 (×3): qty 2

## 2014-10-17 MED ORDER — ONDANSETRON HCL 4 MG/2ML IJ SOLN
INTRAMUSCULAR | Status: DC | PRN
  Administered 2014-10-17: 4 mg via INTRAVENOUS

## 2014-10-17 MED ORDER — ACETAMINOPHEN 325 MG PO TABS: 325.0000 mg | ORAL_TABLET | ORAL | Status: DC | PRN

## 2014-10-17 MED ORDER — ONDANSETRON HCL 4 MG/2ML IJ SOLN
4.0000 mg | Freq: Four times a day (QID) | INTRAMUSCULAR | Status: DC | PRN
  Administered 2014-10-17: 4 mg via INTRAVENOUS
  Filled 2014-10-17: qty 2

## 2014-10-17 MED ORDER — MAGNESIUM SULFATE IN D5W 10-5 MG/ML-% IV SOLN
1.0000 g | Freq: Every day | INTRAVENOUS | Status: DC | PRN
  Filled 2014-10-17: qty 100

## 2014-10-17 MED ORDER — SODIUM CHLORIDE 0.9 % IJ SOLN
INTRAMUSCULAR | Status: AC
  Filled 2014-10-17: qty 10

## 2014-10-17 MED ORDER — FENTANYL CITRATE (PF) 100 MCG/2ML IJ SOLN
INTRAMUSCULAR | Status: DC | PRN
  Administered 2014-10-17 (×4): 50 ug via INTRAVENOUS

## 2014-10-17 MED ORDER — MIDAZOLAM HCL 2 MG/2ML IJ SOLN
INTRAMUSCULAR | Status: AC
  Filled 2014-10-17: qty 4

## 2014-10-17 MED ORDER — METOPROLOL TARTRATE 1 MG/ML IV SOLN: 2.0000 mg | INTRAVENOUS | Status: DC | PRN

## 2014-10-17 MED ORDER — PHENYLEPHRINE 40 MCG/ML (10ML) SYRINGE FOR IV PUSH (FOR BLOOD PRESSURE SUPPORT)
PREFILLED_SYRINGE | INTRAVENOUS | Status: AC
  Filled 2014-10-17: qty 10

## 2014-10-17 MED ORDER — SODIUM CHLORIDE 0.9 % IJ SOLN
3.0000 mL | Freq: Two times a day (BID) | INTRAMUSCULAR | Status: DC
  Administered 2014-10-17: 3 mL via INTRAVENOUS

## 2014-10-17 MED ORDER — SENNOSIDES-DOCUSATE SODIUM 8.6-50 MG PO TABS
1.0000 | ORAL_TABLET | Freq: Every evening | ORAL | Status: DC | PRN
  Filled 2014-10-17: qty 1

## 2014-10-17 MED ORDER — 0.9 % SODIUM CHLORIDE (POUR BTL) OPTIME
TOPICAL | Status: DC | PRN
  Administered 2014-10-17: 3000 mL

## 2014-10-17 MED ORDER — ROCURONIUM BROMIDE 50 MG/5ML IV SOLN
INTRAVENOUS | Status: AC
  Filled 2014-10-17: qty 1

## 2014-10-17 MED ORDER — ACETAMINOPHEN 650 MG RE SUPP: 325.0000 mg | RECTAL | Status: DC | PRN

## 2014-10-17 MED ORDER — PROMETHAZINE HCL 25 MG/ML IJ SOLN
INTRAMUSCULAR | Status: AC
  Filled 2014-10-17: qty 1

## 2014-10-17 MED ORDER — PHENOL 1.4 % MT LIQD
1.0000 | OROMUCOSAL | Status: DC | PRN
  Administered 2014-10-18: 1 via OROMUCOSAL
  Filled 2014-10-17: qty 177

## 2014-10-17 MED ORDER — HYDRALAZINE HCL 20 MG/ML IJ SOLN
5.0000 mg | INTRAMUSCULAR | Status: DC | PRN
  Administered 2014-10-17: 5 mg via INTRAVENOUS
  Filled 2014-10-17: qty 1

## 2014-10-17 MED ORDER — EPHEDRINE SULFATE 50 MG/ML IJ SOLN
INTRAMUSCULAR | Status: AC
  Filled 2014-10-17: qty 1

## 2014-10-17 MED ORDER — PROMETHAZINE HCL 25 MG/ML IJ SOLN
6.2500 mg | INTRAMUSCULAR | Status: DC | PRN
Start: 2014-10-17 — End: 2014-10-17
  Administered 2014-10-17: 6.25 mg via INTRAVENOUS

## 2014-10-17 SURGICAL SUPPLY — 37 items
CANISTER SUCTION 2500CC (MISCELLANEOUS) ×3 IMPLANT
CATH ROBINSON RED A/P 18FR (CATHETERS) ×3 IMPLANT
CATH SUCT 10FR WHISTLE TIP (CATHETERS) ×3 IMPLANT
CLIP TI MEDIUM 24 (CLIP) ×3 IMPLANT
CLIP TI WIDE RED SMALL 24 (CLIP) ×3 IMPLANT
CRADLE DONUT ADULT HEAD (MISCELLANEOUS) ×3 IMPLANT
DECANTER SPIKE VIAL GLASS SM (MISCELLANEOUS) IMPLANT
DRAIN HEMOVAC 1/8 X 5 (WOUND CARE) IMPLANT
DRSG COVADERM 4X6 (GAUZE/BANDAGES/DRESSINGS) ×3 IMPLANT
DRSG COVADERM 4X8 (GAUZE/BANDAGES/DRESSINGS) IMPLANT
ELECT REM PT RETURN 9FT ADLT (ELECTROSURGICAL) ×3
ELECTRODE REM PT RTRN 9FT ADLT (ELECTROSURGICAL) ×1 IMPLANT
EVACUATOR SILICONE 100CC (DRAIN) IMPLANT
GAUZE SPONGE 4X4 12PLY STRL (GAUZE/BANDAGES/DRESSINGS) ×3 IMPLANT
GLOVE SS BIOGEL STRL SZ 7 (GLOVE) ×1 IMPLANT
GLOVE SUPERSENSE BIOGEL SZ 7 (GLOVE) ×6
GOWN STRL REUS W/ TWL LRG LVL3 (GOWN DISPOSABLE) ×3 IMPLANT
GOWN STRL REUS W/TWL LRG LVL3 (GOWN DISPOSABLE) ×9
INSERT FOGARTY SM (MISCELLANEOUS) ×3 IMPLANT
KIT BASIN OR (CUSTOM PROCEDURE TRAY) ×3 IMPLANT
KIT ROOM TURNOVER OR (KITS) ×3 IMPLANT
NEEDLE 22X1 1/2 (OR ONLY) (NEEDLE) IMPLANT
NS IRRIG 1000ML POUR BTL (IV SOLUTION) ×6 IMPLANT
PACK CAROTID (CUSTOM PROCEDURE TRAY) ×3 IMPLANT
PAD ARMBOARD 7.5X6 YLW CONV (MISCELLANEOUS) ×6 IMPLANT
PATCH HEMASHIELD 8X150 (Vascular Products) ×2 IMPLANT
SHUNT CAROTID BYPASS 12FRX15.5 (VASCULAR PRODUCTS) IMPLANT
SPONGE INTESTINAL PEANUT (DISPOSABLE) ×3 IMPLANT
SUT PROLENE 6 0 CC (SUTURE) ×3 IMPLANT
SUT SILK 2 0 FS (SUTURE) ×3 IMPLANT
SUT SILK 3 0 TIES 17X18 (SUTURE)
SUT SILK 3-0 18XBRD TIE BLK (SUTURE) IMPLANT
SUT VIC AB 2-0 CT1 27 (SUTURE) ×3
SUT VIC AB 2-0 CT1 TAPERPNT 27 (SUTURE) ×1 IMPLANT
SUT VIC AB 3-0 X1 27 (SUTURE) ×3 IMPLANT
SYR CONTROL 10ML LL (SYRINGE) IMPLANT
WATER STERILE IRR 1000ML POUR (IV SOLUTION) ×3 IMPLANT

## 2014-10-17 NOTE — H&P (View-Only) (Signed)
Patient ID: Aaron Long, male   DOB: 26-May-1940, 74 y.o.   MRN: [id number] Patient to have Myoview today Await cardiology evaluation  Tentatively plan left carotid endarterectomy next Wednesday, September 28 if patient agreeable and cardiac risk satisfactory Patient initially declined going down for Myoview but is now agreed to proceed neck slide has not made definite decision regarding possible surgery next week

## 2014-10-17 NOTE — Progress Notes (Signed)
Tongue pretty much midline & rue Hg is stronger. Dr Kellie Simmering here and aware and updated. No new orders. Will cont to monitor closely.

## 2014-10-17 NOTE — Progress Notes (Addendum)
  Day of Surgery Note    Subjective:  Sleepy-somewhat awakes to voice.  Filed Vitals:   10/17/14 1029  BP:   Pulse:   Temp: 98.5 F (36.9 C)  Resp:     Incisions:   Covered and is dry Extremities:  Bilateral hand grips appear equal, but not squeezing as hard  Cardiac:  regular Lungs:  Non labored Neuro:  Sleepy, but somewhat awakes and follows commands.  Moving all extremities equal.  Tongue with slight shift to the left  Assessment/Plan:  This is a 74 y.o. male who is s/p left CEA  -pt neurologically in tact and following commands.  Hand grips are equal, but not gripping very hard.  He does hold his arm up as well as both legs. Very slight left shift of tongue. -to 3 south when bed available   Leontine Locket, PA-C 10/17/2014 10:46 AM  Pt more alert and hand grip is stronger and equal.  Kerby Hockley 10/17/2014 10:53 AM

## 2014-10-17 NOTE — Op Note (Signed)
OPERATIVE REPORT  Date of Surgery: 10/17/2014  Surgeon: [name], MD  Assistant: Gerri Lins PA  Pre-op Diagnosis: Left carotid artery stenosis with left brain TIA  Post-op Diagnosis: same  Procedure: Procedure(s Extensive left common and internal carotid endarterectomy with Dacron patch angioplasty  Anesthesia: General  EBL: 096 cc  Complications: None  Procedure Details: The patient was taken to the operating room and placed in the supine position. Following induction of satisfactory general endotracheal anesthesia the left neck was prepped and draped in a routine sterile manner. Incision was made on the anterior border of the sternocleidomastoid muscle and carried down through the subcutaneous tissue and platysma using the Bovie. Care was taken not to injure the hypoglossal nerve.. The common internal and external carotid arteries were dissected free. There was a calcified atherosclerotic plaque at the carotid bifurcation extending up the internal carotid artery to the crossing of the hypoglossal nerve and well proximally in the neck down to the clavicular level. A #10 shunt was then prepared and the patient was heparinized. The carotid vessels were occluded with vascular clamps. A long  longitudinal opening was made in the common carotid with a 15 blade extended up the internal carotid with the Potts scissors to a point distal to the disease. The plaque was approximately 90 % stenotic in severity. The distal vessel appeared normal. Shunt was inserted without difficulty reestablishing flow in about 2 minutes. A standard endarterectomy was performed with an eversion endarterectomy of the external carotid. The plaque feathered off  the distal internal carotid artery nicely not requiring any tacking sutures. The lumen was thoroughly irrigated with heparinized saline and loose debris all carefully removed. The arterotomy was then closed with a patch using continuous 6-0 Prolene. Prior to  completion of the  Closure the  shunt was removed after approximately 50 minutes of shunt time. Flow was then reestablished up the external branch initially followed by the internal branch. Protamine was given to her reverse the heparin.Following adequate hemostasis the wound was irrigated with saline and closed in layers with Vicryl ain a subcuticular fashion. Sterile dressing was applied and the patient taken to the recovery room in stable condition.  [name], MD 10/17/2014 10:11 AM

## 2014-10-17 NOTE — Progress Notes (Signed)
Lunch relief by A. Vevelyn Royals RN

## 2014-10-17 NOTE — Anesthesia Preprocedure Evaluation (Addendum)
Anesthesia Evaluation  Patient identified by MRN, date of birth, ID band Patient awake    Reviewed: Allergy & Precautions, NPO status , Patient's Chart, lab work & pertinent test results  History of Anesthesia Complications (+) Emergence Delirium and history of anesthetic complications  Airway Mallampati: II  TM Distance: >3 FB Neck ROM: Full    Dental  (+) Edentulous Upper, Partial Lower, Dental Advisory Given   Pulmonary former smoker,    Pulmonary exam normal        Cardiovascular hypertension, + CAD, + Past MI, + CABG and + Peripheral Vascular Disease  Normal cardiovascular exam     Neuro/Psych TIAnegative psych ROS   GI/Hepatic GERD  ,  Endo/Other  diabetes  Renal/GU Renal disease     Musculoskeletal   Abdominal   Peds  Hematology   Anesthesia Other Findings   Reproductive/Obstetrics                            Anesthesia Physical Anesthesia Plan  ASA: III  Anesthesia Plan:    Post-op Pain Management:    Induction: Intravenous  Airway Management Planned: Oral ETT  Additional Equipment: Arterial line  Intra-op Plan:   Post-operative Plan: Extubation in OR  Informed Consent: I have reviewed the patients History and Physical, chart, labs and discussed the procedure including the risks, benefits and alternatives for the proposed anesthesia with the patient or authorized representative who has indicated his/her understanding and acceptance.   Dental advisory given  Plan Discussed with: CRNA, Anesthesiologist and Surgeon  Anesthesia Plan Comments:        Anesthesia Quick Evaluation

## 2014-10-17 NOTE — Progress Notes (Signed)
Tongue appears to deviate just a bit to left now & rue HG sl weaker than earlier. He is oriented to person & place. Sam PA here at bedside and aware will cont to monitor closely.

## 2014-10-17 NOTE — Transfer of Care (Signed)
Immediate Anesthesia Transfer of Care Note  Patient: Aaron Long  Procedure(s) Performed: Procedure(s): ENDARTERECTOMY CAROTID (Left)  Patient Location: PACU  Anesthesia Type:General  Level of Consciousness: awake, alert  and oriented  Airway & Oxygen Therapy: Patient Spontanous Breathing and Patient connected to nasal cannula oxygen  Post-op Assessment: Report given to RN and Post -op Vital signs reviewed and stable  Post vital signs: Reviewed and stable  Last Vitals:  Filed Vitals:   10/17/14 0642  BP:   Pulse: 87  Temp:   Resp:    Systolic BP 141. Pt denies pain. Will labetolol given. Complications: No apparent anesthesia complications

## 2014-10-17 NOTE — Interval H&P Note (Signed)
History and Physical Interval Note:  10/17/2014 7:28 AM  Aaron Long  has presented today for surgery, with the diagnosis of Left carotid artery stenosis I65.22  The various methods of treatment have been discussed with the patient and family. After consideration of risks, benefits and other options for treatment, the patient has consented to  Procedure(s): ENDARTERECTOMY CAROTID (Left) as a surgical intervention .  [name] history has been reviewed, patient examined, no change in status, stable for surgery.  I have reviewed the patient's chart and labs.  Questions were answered to the patient's satisfaction.     [name]

## 2014-10-17 NOTE — Anesthesia Postprocedure Evaluation (Signed)
Anesthesia Post Note  Patient: Aaron Long  Procedure(s) Performed: Procedure(s) (LRB): ENDARTERECTOMY CAROTID (Left)  Anesthesia type: general  Patient location: PACU  Post pain: Pain level controlled  Post assessment: Patient's Cardiovascular Status Stable  Last Vitals:  Filed Vitals:   10/17/14 1330  BP: 164/65  Pulse: 79  Temp:   Resp: 19    Post vital signs: Reviewed and stable  Level of consciousness: sedated  Complications: No apparent anesthesia complications

## 2014-10-17 NOTE — Progress Notes (Signed)
Pharmacy: Zinacef Renal Adjustment  52 YOM s/p L-carotid endarterectomy today who received Zinacef at 0800 pre-op today. The patient is noted to have ESRD with normal HD on MWF. Will enter a post-HD dose on 10/7 to complete ~24 hour post-op coverage.   Plan 1. Adjust Zinacef to 750 mg x 1 dose post-HD on 10/7 2. Pharmacy will monitor peripherally for any other necessary renal adjustments.  Alycia Rossetti, PharmD, BCPS Clinical Pharmacist Pager: [phone] 10/17/2014 3:23 PM

## 2014-10-17 NOTE — Anesthesia Procedure Notes (Signed)
Procedure Name: Intubation Date/Time: 10/17/2014 8:12 AM Performed by: Merdis Delay Pre-anesthesia Checklist: Patient identified, Timeout performed, Emergency Drugs available, Suction available and Patient being monitored Patient Re-evaluated:Patient Re-evaluated prior to inductionOxygen Delivery Method: Circle system utilized Preoxygenation: Pre-oxygenation with 100% oxygen Intubation Type: IV induction Ventilation: Mask ventilation without difficulty and Oral airway inserted - appropriate to patient size Laryngoscope Size: Mac and 3 Grade View: Grade I Tube size: 7.5 mm Number of attempts: 1 Airway Equipment and Method: Stylet and LTA kit utilized Placement Confirmation: ETT inserted through vocal cords under direct vision,  breath sounds checked- equal and bilateral,  positive ETCO2 and CO2 detector Secured at: 22 cm Tube secured with: Tape Dental Injury: Teeth and Oropharynx as per pre-operative assessment

## 2014-10-18 ENCOUNTER — Other Ambulatory Visit: Payer: Self-pay | Admitting: *Deleted

## 2014-10-18 ENCOUNTER — Encounter (HOSPITAL_COMMUNITY): Payer: Self-pay | Admitting: Vascular Surgery

## 2014-10-18 LAB — CBC
HCT: 34.3 % — ABNORMAL LOW (ref 39.0–52.0)
Hemoglobin: 11 g/dL — ABNORMAL LOW (ref 13.0–17.0)
MCH: 32.1 pg (ref 26.0–34.0)
MCHC: 32.1 g/dL (ref 30.0–36.0)
MCV: 100 fL (ref 78.0–100.0)
Platelets: 236 K/uL (ref 150–400)
RBC: 3.43 MIL/uL — ABNORMAL LOW (ref 4.22–5.81)
RDW: 15.6 % — ABNORMAL HIGH (ref 11.5–15.5)
WBC: 11 K/uL — ABNORMAL HIGH (ref 4.0–10.5)

## 2014-10-18 LAB — TYPE AND SCREEN
ABO/RH(D): O POS
ANTIBODY SCREEN: NEGATIVE
Unit division: 0
Unit division: 0

## 2014-10-18 LAB — BASIC METABOLIC PANEL WITH GFR
Anion gap: 13 (ref 5–15)
BUN: 45 mg/dL — ABNORMAL HIGH (ref 6–20)
CO2: 26 mmol/L (ref 22–32)
Calcium: 9.5 mg/dL (ref 8.9–10.3)
Chloride: 94 mmol/L — ABNORMAL LOW (ref 101–111)
Creatinine, Ser: 6.14 mg/dL — ABNORMAL HIGH (ref 0.61–1.24)
GFR calc Af Amer: 9 mL/min — ABNORMAL LOW
GFR calc non Af Amer: 8 mL/min — ABNORMAL LOW
Glucose, Bld: 292 mg/dL — ABNORMAL HIGH (ref 65–99)
Potassium: 5.1 mmol/L (ref 3.5–5.1)
Sodium: 133 mmol/L — ABNORMAL LOW (ref 135–145)

## 2014-10-18 LAB — GLUCOSE, CAPILLARY
Glucose-Capillary: 207 mg/dL — ABNORMAL HIGH (ref 65–99)
Glucose-Capillary: 237 mg/dL — ABNORMAL HIGH (ref 65–99)

## 2014-10-18 MED ORDER — HYDROCODONE-ACETAMINOPHEN 5-325 MG PO TABS: 1.0000 | ORAL_TABLET | ORAL | Status: DC | PRN

## 2014-10-18 MED ORDER — PREGABALIN 75 MG PO CAPS: ORAL_CAPSULE | ORAL | Status: DC

## 2014-10-18 NOTE — Telephone Encounter (Signed)
[facility] [place] 

## 2014-10-18 NOTE — Progress Notes (Signed)
[facility] [place]  Subjective  - Doing well sore throat.  He wants to go home.   Objective 157/60 96 98.6 F (37 C) (Oral) 8 98%  Intake/Output Summary (Last 24 hours) at 10/18/14 0735 Last data filed at 10/17/14 1600  Gross per 24 hour  Intake    830 ml  Output    150 ml  Net    680 ml   Tongue deviation left  Herat RRR Lungs non labored breathing Grip weak on the right 4/5 and left 5/5 baseline   Assessment/Planning: POD # 1 s/p left CEA  Sore throat will wait for breakfast  Plan discharge home he has out patient HD at 12 pm today. F/U in 2 weeks with Dr. Jolyn Nap, [facility] 10/18/2014 7:35 AM --  Laboratory Lab Results:  Recent Labs  10/17/14 1600 10/18/14 0427  WBC 15.2* 11.0*  HGB 12.0* 11.0*  HCT 38.1* 34.3*  PLT 238 236   BMET  Recent Labs  10/15/14 1345 10/17/14 0650 10/17/14 1600 10/18/14 0427  NA 139 139  --  133*  K 4.7 4.4  --  5.1  CL 103  --   --  94*  CO2 25  --   --  26  GLUCOSE 243* 137*  --  292*  BUN 42*  --   --  45*  CREATININE 5.91*  --  5.68* 6.14*  CALCIUM 9.7  --   --  9.5    COAG Lab Results  Component Value Date   INR 1.07 10/15/2014   INR 1.04 09/30/2014   INR 1.05 09/09/2014   No results found for: PTT

## 2014-10-18 NOTE — Progress Notes (Signed)
Aaron Long to be D/C'd Home per MD order.  Discussed with the patient and all questions fully answered.  VSS, Skin clean, dry and intact without evidence of skin break down, no evidence of skin tears noted. IV catheter discontinued intact. Site without signs and symptoms of complications. Dressing and pressure applied.  An After Visit Summary was printed and given to the patient. Patient received prescription.  D/c education completed with patient/family including follow up instructions, medication list, d/c activities limitations if indicated, with other d/c instructions as indicated by MD - patient able to verbalize understanding, all questions fully answered.   Patient instructed to return to ED, call 911, or call MD for any changes in condition.   Patient will be escorted via WC, and D/C home via private auto.  [name] 10/18/2014 9:29 AM

## 2014-10-19 DIAGNOSIS — N186 End stage renal disease: Secondary | ICD-10-CM | POA: Diagnosis not present

## 2014-10-19 DIAGNOSIS — D509 Iron deficiency anemia, unspecified: Secondary | ICD-10-CM | POA: Diagnosis not present

## 2014-10-19 DIAGNOSIS — E214 Other specified disorders of parathyroid gland: Secondary | ICD-10-CM | POA: Diagnosis not present

## 2014-10-19 DIAGNOSIS — D631 Anemia in chronic kidney disease: Secondary | ICD-10-CM | POA: Diagnosis not present

## 2014-10-21 NOTE — Discharge Summary (Signed)
[facility] Discharge Summary   Patient ID:  Aaron Long MRN: [id number] DOB/AGE: 75-Sep-1942 75 y.o.  Admit date: 10/17/2014 Discharge date: 10/18/2014 Date of Surgery: 10/17/2014 Surgeon: Surgeon(s): Mal Misty, MD  Admission Diagnosis: Left carotid artery stenosis I65.22  Discharge Diagnoses:  Left carotid artery stenosis I65.22  Secondary Diagnoses: Past Medical History  Diagnosis Date  . PAD (peripheral artery disease) ([place])     a. R CEA 2007. b. Hx L BKA in 2013. c. s/p LE angioplasty in 2014. d. Hx R BKA in 2014.  . Gangrene of toe (H[facility])     dry  . Neuromuscular disorder ([place])     diabetic neuropathy  . Hypertension   . Hyperlipidemia   . Peripheral neuropathy ([place])   . Constipation     takes Miralax daily  . Hemorrhoids   . Hx of colonic polyps   . Coronary artery disease     a. s/p NSTEMI/CABGx4 in 2007 - LIMA-LAD, SVG-optional diagonal, SVVG-OM, SVG-dRCA. b. Nuc 05/2011: nonischemic.  [name] BPH (benign prostatic hypertrophy)   . Abnormality of gait   . DVT (deep venous thrombosis) ([place])     a. Lower extremity DVT in 2013.  [name] Hypertrophy of prostate without urinary obstruction and other lower urinary tract symptoms (LUTS)   . Disorder of bone and cartilage, unspecified   . Lower limb amputation, below knee   . Anemia   . Secondary hyperparathyroidism ([place])     Secondary Hyperpara- Thyroidism, Renal  . Ischemic cardiomyopathy     a. EF 40% in 2007. b. 51% by nuc 05/2011.  [name] Complication of anesthesia     "he gets delirious"  . Myocardial infarction ([place]) 2005  . Type II diabetes mellitus ([place])   . Diabetic peripheral neuropathy ([place])   . Prostate cancer ([place]) 2010  . ESRD on dialysis [facility])     M-W-F  . Kidney stones     Procedure(s): ENDARTERECTOMY CAROTID  Discharged Condition: good  HPI: Aaron Long is a 74 y.o. male who presents for evaluation of possible steal syndrome right upper extremity. Patient had radial cephalic  AV fistula created in April of this year. 06/17/2014 he had ligation of 2 competing branches in the mid and proximal forearm. He was experiencing some numbness and tingling at the time of the second procedure which was not severe. He was found to have a very calcified radial artery at the time of his original surgery but it was of adequate size to create the fistula. He has not had much problems with the hand until he started on dialysis and he has been having some pain in the hand while on dialysis at times he describes as severe. After dialysis is over he has some numbness and tingling which she can tolerate he states. He thinks that it may be getting a little bit better since this fistula has only been utilized for 1-2 weeks.  09/30/2014  who was brought to the emergency room for evaluation of altered mental status. He was also found to have right arm and right facial weakness. Patient was subsequently admitted for further evaluation and treatment.  Carotid doppler shows approximate 90% left ICA stenosis with extremely high velocities of 440 cm/s over 190 cm/s.  Left brain TIA.      Hospital Course:  [name] is a 74 y.o. male is S/P Right Procedure(s): ENDARTERECTOMY CAROTID 10/17/2014 by Dr. Kellie Simmering. -pt neurologically in tact and following commands. Hand grips are equal,  but not gripping very hard. He does hold his arm up as well as both legs. Very slight left shift of tongue. -to 3 south when bed available. POD# 1 Patient tolerated PO s back to baseline.   Plan discharge home he has out patient HD at 12 pm today. F/U in 2 weeks with Dr. Kellie Simmering   Significant Diagnostic Studies: CBC Lab Results  Component Value Date   WBC 11.0* 10/18/2014   HGB 11.0* 10/18/2014   HCT 34.3* 10/18/2014   MCV 100.0 10/18/2014   PLT 236 10/18/2014    BMET    Component Value Date/Time   NA 133* 10/18/2014 0427   NA 144 04/02/2014 1540   K 5.1 10/18/2014 0427   CL 94* 10/18/2014 0427   CO2 26  10/18/2014 0427   GLUCOSE 292* 10/18/2014 0427   GLUCOSE 61* 04/02/2014 1540   BUN 45* 10/18/2014 0427   BUN 45* 04/02/2014 1540   CREATININE 6.14* 10/18/2014 0427   CALCIUM 9.5 10/18/2014 0427   GFRNONAA 8* 10/18/2014 0427   GFRAA 9* 10/18/2014 0427   COAG Lab Results  Component Value Date   INR 1.07 10/15/2014   INR 1.04 09/30/2014   INR 1.05 09/09/2014     Disposition:  Discharge to :Home Discharge Instructions    Call MD for:  redness, tenderness, or signs of infection (pain, swelling, bleeding, redness, odor or green/yellow discharge around incision site)    Complete by:  As directed      Call MD for:  severe or increased pain, loss or decreased feeling  in affected limb(s)    Complete by:  As directed      Call MD for:  temperature >100.5    Complete by:  As directed      Discharge instructions    Complete by:  As directed   You may shower in 24 hours.     Discharge patient    Complete by:  As directed   Discharge pt to home as long as he tolerates breakfast OK thx     Increase activity slowly    Complete by:  As directed   Walk with assistance use walker or cane as needed     Resume previous diet    Complete by:  As directed             Medication List    STOP taking these medications        pregabalin 75 MG capsule  Commonly known as:  LYRICA      TAKE these medications        AMBULATORY NON FORMULARY MEDICATION  One Touch Ultra 2 Test Strips Sig:  Check blood sugars Three times daily to keep blood sugars under control Dx: E11.9     AMBULATORY NON FORMULARY MEDICATION  BD U/P Mini Pen [place] 31GX5MM Use three times daily as directed DX: E11.9     aspirin EC 81 MG tablet  Take 1 tablet (81 mg total) by mouth every morning.     calcium acetate 667 MG capsule  Commonly known as:  PHOSLO  Take 1 capsule (667 mg total) by mouth 3 (three) times daily with meals.     clopidogrel 75 MG tablet  Commonly known as:  PLAVIX  take 1 tablet by mouth once  daily     HYDROcodone-acetaminophen 5-325 MG tablet  Commonly known as:  NORCO/VICODIN  Take 1 tablet by mouth every 12 hours as needed for pain     HYDROcodone-acetaminophen 5-325  MG tablet  Commonly known as:  NORCO/VICODIN  Take 1 tablet by mouth every 4 (four) hours as needed for moderate pain.     insulin aspart 100 UNIT/ML FlexPen  Commonly known as:  NOVOLOG  Inject 5 Units into the skin 3 (three) times daily with meals. Inject 5 units three times a day with meals if blood sugar greater than 150 only, DX: 250.71     Insulin Detemir 100 UNIT/ML Pen  Commonly known as:  LEVEMIR  Inject 15 Units into the skin 2 (two) times daily.     multivitamin Tabs tablet  Take 1 tablet by mouth at bedtime.     pravastatin 20 MG tablet  Commonly known as:  PRAVACHOL  Take 2 tablets (40 mg total) by mouth daily.     QUEtiapine 25 MG tablet  Commonly known as:  SEROQUEL  Take 1 tablet (25 mg total) by mouth daily.     senna-docusate 8.6-50 MG tablet  Commonly known as:  Senokot-S  Take 2 tablets by mouth 2 (two) times daily.     tamsulosin 0.4 MG Caps capsule  Commonly known as:  FLOMAX  Take 1 capsule (0.4 mg total) by mouth daily.     Wheelchair Misc  Patient needs new Wheelchair due to his being broken.       Verbal and written Discharge instructions given to the patient. Wound care per Discharge AVS   Signed: Laurence Slate [facility] 10/21/2014, 9:02 AM  --- For VQI Registry use --- Instructions: Press F2 to tab through selections.  Delete question if not applicable.   Modified Rankin score at D/C (0-6): Rankin Score=0  IV medication needed for:  1. Hypertension: No 2. Hypotension: No  Post-op Complications: No  1. Post-op CVA or TIA: No  If yes: Event classification (right eye, left eye, right cortical, left cortical, verterobasilar, other):   If yes: Timing of event (intra-op, <6 hrs post-op, >=6 hrs post-op, unknown):   2. CN injury: No  If yes: CN   injuried   3. Myocardial infarction: No  If yes: Dx by (EKG or clinical, Troponin):   4.  CHF: No  5.  Dysrhythmia (new): No  6. Wound infection: No  7. Reperfusion symptoms: No  8. Return to OR: No  If yes: return to OR for (bleeding, neurologic, other CEA incision, other):   Discharge medications: Statin use:  Yes ASA use:  Yes Beta blocker use:  No  for medical reason   ACE-Inhibitor use:  No  for medical reason   P2Y12 Antagonist use: [ ]  None, [x ] Plavix, [ ]  Plasugrel, [ ]  Ticlopinine, [ ]  Ticagrelor, [ ]  Other, [ ]  No for medical reason, [ ]  Non-compliant, [ ]  Not-indicated Anti-coagulant use:  [ x] None, [ ]  Warfarin, [ ]  Rivaroxaban, [ ]  Dabigatran, [ ]  Other, [ ]  No for medical reason, [ ]  Non-compliant, [ ]  Not-indicated

## 2014-10-22 DIAGNOSIS — D631 Anemia in chronic kidney disease: Secondary | ICD-10-CM | POA: Diagnosis not present

## 2014-10-22 DIAGNOSIS — N186 End stage renal disease: Secondary | ICD-10-CM | POA: Diagnosis not present

## 2014-10-22 DIAGNOSIS — E214 Other specified disorders of parathyroid gland: Secondary | ICD-10-CM | POA: Diagnosis not present

## 2014-10-22 DIAGNOSIS — D509 Iron deficiency anemia, unspecified: Secondary | ICD-10-CM | POA: Diagnosis not present

## 2014-10-23 DIAGNOSIS — D631 Anemia in chronic kidney disease: Secondary | ICD-10-CM | POA: Diagnosis not present

## 2014-10-23 DIAGNOSIS — E214 Other specified disorders of parathyroid gland: Secondary | ICD-10-CM | POA: Diagnosis not present

## 2014-10-23 DIAGNOSIS — D509 Iron deficiency anemia, unspecified: Secondary | ICD-10-CM | POA: Diagnosis not present

## 2014-10-23 DIAGNOSIS — N186 End stage renal disease: Secondary | ICD-10-CM | POA: Diagnosis not present

## 2014-10-28 DIAGNOSIS — E214 Other specified disorders of parathyroid gland: Secondary | ICD-10-CM | POA: Diagnosis not present

## 2014-10-28 DIAGNOSIS — T82858D Stenosis of vascular prosthetic devices, implants and grafts, subsequent encounter: Secondary | ICD-10-CM | POA: Diagnosis not present

## 2014-10-28 DIAGNOSIS — I871 Compression of vein: Secondary | ICD-10-CM | POA: Diagnosis not present

## 2014-10-28 DIAGNOSIS — D509 Iron deficiency anemia, unspecified: Secondary | ICD-10-CM | POA: Diagnosis not present

## 2014-10-28 DIAGNOSIS — N186 End stage renal disease: Secondary | ICD-10-CM | POA: Diagnosis not present

## 2014-10-28 DIAGNOSIS — D631 Anemia in chronic kidney disease: Secondary | ICD-10-CM | POA: Diagnosis not present

## 2014-10-28 DIAGNOSIS — M79601 Pain in right arm: Secondary | ICD-10-CM | POA: Diagnosis not present

## 2014-10-30 DIAGNOSIS — N186 End stage renal disease: Secondary | ICD-10-CM | POA: Diagnosis not present

## 2014-10-30 DIAGNOSIS — D509 Iron deficiency anemia, unspecified: Secondary | ICD-10-CM | POA: Diagnosis not present

## 2014-10-30 DIAGNOSIS — E214 Other specified disorders of parathyroid gland: Secondary | ICD-10-CM | POA: Diagnosis not present

## 2014-10-30 DIAGNOSIS — D631 Anemia in chronic kidney disease: Secondary | ICD-10-CM | POA: Diagnosis not present

## 2014-11-01 ENCOUNTER — Encounter: Payer: Self-pay | Admitting: Vascular Surgery

## 2014-11-01 DIAGNOSIS — D509 Iron deficiency anemia, unspecified: Secondary | ICD-10-CM | POA: Diagnosis not present

## 2014-11-01 DIAGNOSIS — E214 Other specified disorders of parathyroid gland: Secondary | ICD-10-CM | POA: Diagnosis not present

## 2014-11-01 DIAGNOSIS — N186 End stage renal disease: Secondary | ICD-10-CM | POA: Diagnosis not present

## 2014-11-01 DIAGNOSIS — D631 Anemia in chronic kidney disease: Secondary | ICD-10-CM | POA: Diagnosis not present

## 2014-11-04 DIAGNOSIS — N186 End stage renal disease: Secondary | ICD-10-CM | POA: Diagnosis not present

## 2014-11-04 DIAGNOSIS — D631 Anemia in chronic kidney disease: Secondary | ICD-10-CM | POA: Diagnosis not present

## 2014-11-04 DIAGNOSIS — D509 Iron deficiency anemia, unspecified: Secondary | ICD-10-CM | POA: Diagnosis not present

## 2014-11-04 DIAGNOSIS — E214 Other specified disorders of parathyroid gland: Secondary | ICD-10-CM | POA: Diagnosis not present

## 2014-11-05 ENCOUNTER — Encounter: Payer: Self-pay | Admitting: Vascular Surgery

## 2014-11-05 ENCOUNTER — Ambulatory Visit (HOSPITAL_COMMUNITY)
Admission: RE | Admit: 2014-11-05 | Discharge: 2014-11-05 | Disposition: A | Discharge status: Home / Self Care | Payer: Medicare Other | Source: Ambulatory Visit | Attending: Vascular Surgery | Admitting: Vascular Surgery

## 2014-11-05 ENCOUNTER — Encounter (HOSPITAL_COMMUNITY): Payer: Self-pay | Admitting: *Deleted

## 2014-11-05 ENCOUNTER — Ambulatory Visit (INDEPENDENT_AMBULATORY_CARE_PROVIDER_SITE_OTHER): Payer: Medicare Other | Admitting: Vascular Surgery

## 2014-11-05 ENCOUNTER — Other Ambulatory Visit: Payer: Self-pay

## 2014-11-05 VITALS — BP 158/78 | HR 78 | Temp 98.6°F | Resp 16 | Ht 68.0 in | Wt 181.0 lb

## 2014-11-05 DIAGNOSIS — R200 Anesthesia of skin: Secondary | ICD-10-CM

## 2014-11-05 DIAGNOSIS — Z992 Dependence on renal dialysis: Secondary | ICD-10-CM | POA: Insufficient documentation

## 2014-11-05 DIAGNOSIS — M79641 Pain in right hand: Secondary | ICD-10-CM

## 2014-11-05 DIAGNOSIS — R202 Paresthesia of skin: Secondary | ICD-10-CM | POA: Diagnosis not present

## 2014-11-05 DIAGNOSIS — N186 End stage renal disease: Secondary | ICD-10-CM

## 2014-11-05 DIAGNOSIS — I6523 Occlusion and stenosis of bilateral carotid arteries: Secondary | ICD-10-CM

## 2014-11-05 MED ORDER — SODIUM CHLORIDE 0.9 % IV SOLN: INTRAVENOUS | Status: DC

## 2014-11-05 MED ORDER — CEFUROXIME SODIUM 1.5 G IJ SOLR
1.5000 g | INTRAMUSCULAR | Status: AC
  Administered 2014-11-06: 1.5 g via INTRAVENOUS
  Filled 2014-11-05: qty 1.5

## 2014-11-05 MED ORDER — CHLORHEXIDINE GLUCONATE CLOTH 2 % EX PADS: 6.0000 | MEDICATED_PAD | Freq: Once | CUTANEOUS | Status: DC

## 2014-11-05 NOTE — Progress Notes (Signed)
SDW-Pre-op call completed by pt and pt spouse. Pt denies SOB and chest pain but is under the care of Dr. Julianne Handler, cardiology. Pt made aware to stop otc vitamins and herbal medications. Pt is scheduled to take Plavix in the evening on DOS. Pt instructed to take half of HS insulin with a high protein snack tonight and no insulin in the AM by MD staff. Pt and spouse verbalized understanding of all pre-op instructions.

## 2014-11-05 NOTE — Addendum Note (Signed)
Addended by: Mena Goes on: 11/05/2014 05:34 PM   Modules accepted: Orders

## 2014-11-05 NOTE — Progress Notes (Signed)
Subjective:     Patient ID: Aaron Long, male   DOB: 10-26-1940, 74 y.o.   MRN: [id number]  HPI this 74 year old male returns for follow-up regarding his left carotid endarterectomy done a few weeks ago. He has no complaints related to this surgery. He is swallowing well and denies hoarseness. He has had no lateralizing weakness, aphasia, amaurosis fugax, diplopia, blurred vision, or syncope.   patient has a right radial cephalic AV fistula which we have been following for steal syndrome debating on whether to ligate the fistula and insert a contralateral left upper arm fistula but the patient has refused that because he did not want a fistula in the left arm. His symptoms of numbness in the right third fourth and fifth digits had been fairly stable but ten-day's ago he thrombosed the fistula. He then underwent thrombo-lysis by Dr. Graylon Gunning and [facility] and since that time he has had pain and numbness in the right hand he states. The dialysis has continued through the fistula successfully. He dialyzes Monday Wednesday Friday.   Past Medical History  Diagnosis Date  . PAD (peripheral artery disease) ([place])     a. R CEA 2007. b. Hx L BKA in 2013. c. s/p LE angioplasty in 2014. d. Hx R BKA in 2014.  . Gangrene of toe (H[facility])     dry  . Neuromuscular disorder ([place])     diabetic neuropathy  . Hypertension   . Hyperlipidemia   . Peripheral neuropathy ([place])   . Constipation     takes Miralax daily  . Hemorrhoids   . Hx of colonic polyps   . Coronary artery disease     a. s/p NSTEMI/CABGx4 in 2007 - LIMA-LAD, SVG-optional diagonal, SVVG-OM, SVG-dRCA. b. Nuc 05/2011: nonischemic.  [name] BPH (benign prostatic hypertrophy)   . Abnormality of gait   . DVT (deep venous thrombosis) ([place])     a. Lower extremity DVT in 2013.  [name] Hypertrophy of prostate without urinary obstruction and other lower urinary tract symptoms (LUTS)   . Disorder of bone and cartilage, unspecified   . Lower limb amputation, below  knee   . Anemia   . Secondary hyperparathyroidism ([place])     Secondary Hyperpara- Thyroidism, Renal  . Ischemic cardiomyopathy     a. EF 40% in 2007. b. 51% by nuc 05/2011.  [name] Complication of anesthesia     "he gets delirious"  . Myocardial infarction ([place]) 2005  . Type II diabetes mellitus ([place])   . Diabetic peripheral neuropathy ([place])   . Prostate cancer ([place]) 2010  . ESRD on dialysis [facility])     M-W-F  . Kidney stones     Social History  Substance Use Topics  . Smoking status: Former Smoker -- 1.00 packs/day for 30 years    Types: Cigarettes    Quit date: 04/28/1991  . Smokeless tobacco: Never Used  . Alcohol Use: Yes     Comment: 04/17/2014 "might have a glass of wine at Christmas"    Family History  Problem Relation Age of Onset  . Coronary artery disease Neg Hx   . Anesthesia problems Neg Hx   . Hypotension Neg Hx   . Malignant hyperthermia Neg Hx   . Pseudochol deficiency Neg Hx   . Hyperlipidemia Mother   . Hypertension Mother   . Cancer Mother   . Hyperlipidemia Father   . Hypertension Father   . Kidney disease Father   . Heart disease Sister   . Alzheimer's  disease Sister   . Cancer Brother   . Other Sister     Allergies  Allergen Reactions  . Oxycodone Other (See Comments)    Hallucinations     Current outpatient prescriptions:  .  AMBULATORY NON FORMULARY MEDICATION, One Touch Ultra 2 Test Strips Sig:  Check blood sugars Three times daily to keep blood sugars under control Dx: E11.9, Disp: 100 strip, Rfl: 11 .  AMBULATORY NON FORMULARY MEDICATION, BD U/P Mini Pen Neddles 31GX5MM Use three times daily as directed DX: E11.9, Disp: 100 each, Rfl: 11 .  aspirin EC 81 MG tablet, Take 1 tablet (81 mg total) by mouth every morning., Disp: , Rfl:  .  calcium acetate (PHOSLO) 667 MG capsule, Take 1 capsule (667 mg total) by mouth 3 (three) times daily with meals., Disp: 90 capsule, Rfl: 1 .  clopidogrel (PLAVIX) 75 MG tablet, take 1 tablet by mouth once daily,  Disp: 90 tablet, Rfl: 1 .  HYDROcodone-acetaminophen (NORCO/VICODIN) 5-325 MG tablet, Take 1 tablet by mouth every 4 (four) hours as needed for moderate pain., Disp: 30 tablet, Rfl: 0 .  insulin aspart (NOVOLOG) 100 UNIT/ML FlexPen, Inject 5 Units into the skin 3 (three) times daily with meals. Inject 5 units three times a day with meals if blood sugar greater than 150 only, DX: 250.71, Disp: , Rfl:  .  Insulin Detemir (LEVEMIR) 100 UNIT/ML Pen, Inject 15 Units into the skin 2 (two) times daily., Disp: 15 mL, Rfl: 3 .  Misc. Devices [facility]) MISC, Patient needs new Wheelchair due to his being broken., Disp: 1 each, Rfl: 0 .  multivitamin (RENA-VIT) TABS tablet, Take 1 tablet by mouth at bedtime., Disp: 30 tablet, Rfl: 1 .  pravastatin (PRAVACHOL) 20 MG tablet, Take 2 tablets (40 mg total) by mouth daily., Disp: 60 tablet, Rfl: 0 .  pregabalin (LYRICA) 75 MG capsule, Take one tablet by mouth once daily for pains, Disp: 30 capsule, Rfl: 0 .  QUEtiapine (SEROQUEL) 25 MG tablet, Take 1 tablet (25 mg total) by mouth daily., Disp: 30 tablet, Rfl: 1 .  senna-docusate (SENOKOT-S) 8.6-50 MG per tablet, Take 2 tablets by mouth 2 (two) times daily., Disp: 60 tablet, Rfl: 0 .  tamsulosin (FLOMAX) 0.4 MG CAPS capsule, Take 1 capsule (0.4 mg total) by mouth daily., Disp: 30 capsule, Rfl: 3  Filed Vitals:   11/05/14 1207  BP: 158/78  Pulse: 78  Temp: 98.6 F (37 C)  TempSrc: Oral  Resp: 16  Height: 5\' 8"  (1.727 m)  Weight: 181 lb (82.101 kg)  SpO2: 96%    Body mass index is 27.53 kg/(m^2).           Review of Systems Denies active chest pain, dyspnea on exertion, PND, orthopnea, hemoptysis     Objective:   Physical Exam BP 158/78 mmHg  Pulse 78  Temp(Src) 98.6 F (37 C) (Oral)  Resp 16  Ht 5\' 8"  (1.727 m)  Wt 181 lb (82.101 kg)  BMI 27.53 kg/m2  SpO2 96%    General chronically ill-appearing male patient known apparent distress alert and oriented 3 in a wheelchair  lungs  rhonchi or wheezing Cardiovascular regular rhythm no murmurs  right upper extremity with pulse and palpable thrill in radial-cephalic AV fistula. Right hand with weak grip. Decreased sensation all digits right hand.  Left hand asymptomatic.    today I reviewed the CD supplied by [facility] which reveals patency of the fistula following the procedure with PTA near the radial-cephalic anastomosis.  I also had steal study performed in our office today.  The index of the digit in the right hand with the fistula opening was 0.36. Thi improved to 1.02 with compression of the fistula.  Consistent with steal..     Assessment:     continued steal syndrome right hand due to radial-cephalic AV fistula which worsened following recent thrombo-lysis procedure.   status post left carotid endarterectomy doing well    Plan:      plan return in 6 months for follow-up carotid duplex exam  plan ligation of right radial cephalic AV fistula tomorrow with insertion of hemodialysis catheter   patient is currently adamant about not having access in left upper extremity We'll have to discuss further options with him at a later date

## 2014-11-06 ENCOUNTER — Ambulatory Visit (HOSPITAL_COMMUNITY): Payer: Medicare Other

## 2014-11-06 ENCOUNTER — Ambulatory Visit (HOSPITAL_COMMUNITY): Payer: Medicare Other | Admitting: Certified Registered Nurse Anesthetist

## 2014-11-06 ENCOUNTER — Ambulatory Visit (HOSPITAL_COMMUNITY)
Admission: RE | Admit: 2014-11-06 | Discharge: 2014-11-06 | Disposition: A | Discharge status: Home / Self Care | Payer: Medicare Other | Source: Ambulatory Visit | Attending: Vascular Surgery | Admitting: Vascular Surgery

## 2014-11-06 ENCOUNTER — Encounter (HOSPITAL_COMMUNITY): Payer: Self-pay | Admitting: Critical Care Medicine

## 2014-11-06 ENCOUNTER — Encounter (HOSPITAL_COMMUNITY)
Admission: RE | Disposition: A | Discharge status: Home / Self Care | Payer: Self-pay | Source: Ambulatory Visit | Attending: Vascular Surgery

## 2014-11-06 ENCOUNTER — Other Ambulatory Visit: Payer: Medicare Other | Admitting: *Deleted

## 2014-11-06 DIAGNOSIS — T82898A Other specified complication of vascular prosthetic devices, implants and grafts, initial encounter: Secondary | ICD-10-CM | POA: Insufficient documentation

## 2014-11-06 DIAGNOSIS — Y832 Surgical operation with anastomosis, bypass or graft as the cause of abnormal reaction of the patient, or of later complication, without mention of misadventure at the time of the procedure: Secondary | ICD-10-CM | POA: Diagnosis not present

## 2014-11-06 DIAGNOSIS — Z951 Presence of aortocoronary bypass graft: Secondary | ICD-10-CM | POA: Diagnosis not present

## 2014-11-06 DIAGNOSIS — E785 Hyperlipidemia, unspecified: Secondary | ICD-10-CM | POA: Diagnosis not present

## 2014-11-06 DIAGNOSIS — Z4901 Encounter for fitting and adjustment of extracorporeal dialysis catheter: Secondary | ICD-10-CM | POA: Diagnosis not present

## 2014-11-06 DIAGNOSIS — N186 End stage renal disease: Secondary | ICD-10-CM | POA: Diagnosis not present

## 2014-11-06 DIAGNOSIS — I2510 Atherosclerotic heart disease of native coronary artery without angina pectoris: Secondary | ICD-10-CM | POA: Insufficient documentation

## 2014-11-06 DIAGNOSIS — I739 Peripheral vascular disease, unspecified: Secondary | ICD-10-CM | POA: Diagnosis not present

## 2014-11-06 DIAGNOSIS — Z419 Encounter for procedure for purposes other than remedying health state, unspecified: Secondary | ICD-10-CM

## 2014-11-06 DIAGNOSIS — Z87891 Personal history of nicotine dependence: Secondary | ICD-10-CM | POA: Diagnosis not present

## 2014-11-06 DIAGNOSIS — N2581 Secondary hyperparathyroidism of renal origin: Secondary | ICD-10-CM | POA: Diagnosis not present

## 2014-11-06 DIAGNOSIS — Z992 Dependence on renal dialysis: Secondary | ICD-10-CM | POA: Insufficient documentation

## 2014-11-06 DIAGNOSIS — E1140 Type 2 diabetes mellitus with diabetic neuropathy, unspecified: Secondary | ICD-10-CM | POA: Insufficient documentation

## 2014-11-06 DIAGNOSIS — E119 Type 2 diabetes mellitus without complications: Secondary | ICD-10-CM | POA: Insufficient documentation

## 2014-11-06 DIAGNOSIS — Z9889 Other specified postprocedural states: Secondary | ICD-10-CM

## 2014-11-06 DIAGNOSIS — Z8546 Personal history of malignant neoplasm of prostate: Secondary | ICD-10-CM | POA: Insufficient documentation

## 2014-11-06 DIAGNOSIS — I252 Old myocardial infarction: Secondary | ICD-10-CM | POA: Diagnosis not present

## 2014-11-06 DIAGNOSIS — I120 Hypertensive chronic kidney disease with stage 5 chronic kidney disease or end stage renal disease: Secondary | ICD-10-CM | POA: Insufficient documentation

## 2014-11-06 DIAGNOSIS — Z01810 Encounter for preprocedural cardiovascular examination: Secondary | ICD-10-CM

## 2014-11-06 HISTORY — PX: INSERTION OF DIALYSIS CATHETER: SHX1324

## 2014-11-06 HISTORY — PX: LIGATION OF ARTERIOVENOUS  FISTULA: SHX5948

## 2014-11-06 LAB — GLUCOSE, CAPILLARY
Glucose-Capillary: 195 mg/dL — ABNORMAL HIGH (ref 65–99)
Glucose-Capillary: 201 mg/dL — ABNORMAL HIGH (ref 65–99)

## 2014-11-06 LAB — POCT I-STAT 4, (NA,K, GLUC, HGB,HCT)
GLUCOSE: 212 mg/dL — AB (ref 65–99)
HEMATOCRIT: 32 % — AB (ref 39.0–52.0)
Hemoglobin: 10.9 g/dL — ABNORMAL LOW (ref 13.0–17.0)
POTASSIUM: 4.1 mmol/L (ref 3.5–5.1)
Sodium: 140 mmol/L (ref 135–145)

## 2014-11-06 SURGERY — LIGATION OF ARTERIOVENOUS  FISTULA
Anesthesia: Monitor Anesthesia Care | Site: Arm Lower | Laterality: Right

## 2014-11-06 MED ORDER — PHENYLEPHRINE 40 MCG/ML (10ML) SYRINGE FOR IV PUSH (FOR BLOOD PRESSURE SUPPORT)
PREFILLED_SYRINGE | INTRAVENOUS | Status: AC
  Filled 2014-11-06: qty 10

## 2014-11-06 MED ORDER — FENTANYL CITRATE (PF) 100 MCG/2ML IJ SOLN: 25.0000 ug | INTRAMUSCULAR | Status: DC | PRN

## 2014-11-06 MED ORDER — LIDOCAINE HCL (CARDIAC) 10 MG/ML IV SOLN
INTRAVENOUS | Status: DC | PRN
  Administered 2014-11-06: 40 mg via INTRAVENOUS

## 2014-11-06 MED ORDER — SODIUM CHLORIDE 0.9 % IV SOLN
INTRAVENOUS | Status: DC
  Administered 2014-11-06: 08:00:00 via INTRAVENOUS

## 2014-11-06 MED ORDER — HYDROCODONE-ACETAMINOPHEN 5-325 MG PO TABS
1.0000 | ORAL_TABLET | Freq: Once | ORAL | Status: AC
  Administered 2014-11-06: 1 via ORAL

## 2014-11-06 MED ORDER — PROPOFOL 10 MG/ML IV BOLUS
INTRAVENOUS | Status: DC | PRN
Start: 2014-11-06 — End: 2014-11-06
  Administered 2014-11-06: 50 mg via INTRAVENOUS
  Administered 2014-11-06: 100 mg via INTRAVENOUS

## 2014-11-06 MED ORDER — EPHEDRINE SULFATE 50 MG/ML IJ SOLN
INTRAMUSCULAR | Status: DC | PRN
  Administered 2014-11-06: 5 mg via INTRAVENOUS

## 2014-11-06 MED ORDER — HEPARIN SODIUM (PORCINE) 1000 UNIT/ML IJ SOLN
INTRAMUSCULAR | Status: AC
  Filled 2014-11-06: qty 1

## 2014-11-06 MED ORDER — MIDAZOLAM HCL 5 MG/5ML IJ SOLN
INTRAMUSCULAR | Status: DC | PRN
  Administered 2014-11-06: 1 mg via INTRAVENOUS

## 2014-11-06 MED ORDER — SODIUM CHLORIDE 0.9 % IJ SOLN
INTRAMUSCULAR | Status: AC
  Filled 2014-11-06: qty 30

## 2014-11-06 MED ORDER — HYDROCODONE-ACETAMINOPHEN 5-325 MG PO TABS
ORAL_TABLET | ORAL | Status: AC
  Filled 2014-11-06: qty 1

## 2014-11-06 MED ORDER — FENTANYL CITRATE (PF) 250 MCG/5ML IJ SOLN
INTRAMUSCULAR | Status: AC
  Filled 2014-11-06: qty 5

## 2014-11-06 MED ORDER — PHENYLEPHRINE HCL 10 MG/ML IJ SOLN
INTRAMUSCULAR | Status: DC | PRN
  Administered 2014-11-06 (×2): 120 ug via INTRAVENOUS
  Administered 2014-11-06: 80 ug via INTRAVENOUS

## 2014-11-06 MED ORDER — FENTANYL CITRATE (PF) 100 MCG/2ML IJ SOLN
INTRAMUSCULAR | Status: AC
  Filled 2014-11-06: qty 2

## 2014-11-06 MED ORDER — LIDOCAINE-EPINEPHRINE (PF) 1 %-1:200000 IJ SOLN
INTRAMUSCULAR | Status: AC
  Filled 2014-11-06: qty 30

## 2014-11-06 MED ORDER — LIDOCAINE HCL (PF) 1 % IJ SOLN
INTRAMUSCULAR | Status: AC
  Filled 2014-11-06: qty 30

## 2014-11-06 MED ORDER — FENTANYL CITRATE (PF) 100 MCG/2ML IJ SOLN
INTRAMUSCULAR | Status: DC | PRN
  Administered 2014-11-06 (×2): 25 ug via INTRAVENOUS

## 2014-11-06 MED ORDER — ONDANSETRON HCL 4 MG/2ML IJ SOLN
INTRAMUSCULAR | Status: DC | PRN
  Administered 2014-11-06: 4 mg via INTRAVENOUS

## 2014-11-06 MED ORDER — DEXTROSE 5 % IV SOLN
10.0000 mg | INTRAVENOUS | Status: DC | PRN
  Administered 2014-11-06: 25 ug/min via INTRAVENOUS

## 2014-11-06 MED ORDER — MIDAZOLAM HCL 2 MG/2ML IJ SOLN
INTRAMUSCULAR | Status: AC
  Filled 2014-11-06: qty 4

## 2014-11-06 MED ORDER — DEXAMETHASONE SODIUM PHOSPHATE 4 MG/ML IJ SOLN
INTRAMUSCULAR | Status: DC | PRN
  Administered 2014-11-06: 4 mg via INTRAVENOUS

## 2014-11-06 MED ORDER — HEPARIN SODIUM (PORCINE) 1000 UNIT/ML IJ SOLN
INTRAMUSCULAR | Status: DC | PRN
  Administered 2014-11-06: 1000 [IU]

## 2014-11-06 MED ORDER — 0.9 % SODIUM CHLORIDE (POUR BTL) OPTIME
TOPICAL | Status: DC | PRN
  Administered 2014-11-06: 1000 mL

## 2014-11-06 MED ORDER — ROCURONIUM BROMIDE 50 MG/5ML IV SOLN
INTRAVENOUS | Status: AC
  Filled 2014-11-06: qty 1

## 2014-11-06 MED ORDER — LIDOCAINE HCL (CARDIAC) 20 MG/ML IV SOLN
INTRAVENOUS | Status: AC
  Filled 2014-11-06: qty 10

## 2014-11-06 MED ORDER — PROMETHAZINE HCL 25 MG/ML IJ SOLN: 6.2500 mg | INTRAMUSCULAR | Status: DC | PRN

## 2014-11-06 MED ORDER — ETOMIDATE 2 MG/ML IV SOLN
INTRAVENOUS | Status: AC
  Filled 2014-11-06: qty 10

## 2014-11-06 MED ORDER — ONDANSETRON HCL 4 MG/2ML IJ SOLN
INTRAMUSCULAR | Status: AC
  Filled 2014-11-06: qty 2

## 2014-11-06 MED ORDER — HYDROCODONE-ACETAMINOPHEN 5-325 MG PO TABS: 1.0000 | ORAL_TABLET | Freq: Four times a day (QID) | ORAL | Status: DC | PRN

## 2014-11-06 MED ORDER — EPHEDRINE SULFATE 50 MG/ML IJ SOLN
INTRAMUSCULAR | Status: AC
  Filled 2014-11-06: qty 1

## 2014-11-06 MED ORDER — HEPARIN SODIUM (PORCINE) 5000 UNIT/ML IJ SOLN
INTRAMUSCULAR | Status: DC | PRN
  Administered 2014-11-06: 11:00:00

## 2014-11-06 MED ORDER — PROPOFOL 10 MG/ML IV BOLUS
INTRAVENOUS | Status: AC
  Filled 2014-11-06: qty 20

## 2014-11-06 SURGICAL SUPPLY — 57 items
BAG DECANTER FOR FLEXI CONT (MISCELLANEOUS) ×4 IMPLANT
BIOPATCH RED 1 DISK 7.0 (GAUZE/BANDAGES/DRESSINGS) ×3 IMPLANT
BIOPATCH RED 1IN DISK 7.0MM (GAUZE/BANDAGES/DRESSINGS) ×1
CANISTER SUCTION 2500CC (MISCELLANEOUS) ×4 IMPLANT
CATH CANNON HEMO 15F 50CM (CATHETERS) IMPLANT
CATH CANNON HEMO 15FR 19 (HEMODIALYSIS SUPPLIES) IMPLANT
CATH CANNON HEMO 15FR 23CM (HEMODIALYSIS SUPPLIES) ×2 IMPLANT
CATH CANNON HEMO 15FR 31CM (HEMODIALYSIS SUPPLIES) IMPLANT
CATH CANNON HEMO 15FR 32 (HEMODIALYSIS SUPPLIES) IMPLANT
CATH CANNON HEMO 15FR 32CM (HEMODIALYSIS SUPPLIES) IMPLANT
CLIP TI MEDIUM 6 (CLIP) IMPLANT
CLIP TI WIDE RED SMALL 6 (CLIP) IMPLANT
COVER PROBE W GEL 5X96 (DRAPES) IMPLANT
COVER SURGICAL LIGHT HANDLE (MISCELLANEOUS) ×4 IMPLANT
DECANTER SPIKE VIAL GLASS SM (MISCELLANEOUS) ×4 IMPLANT
DRAPE C-ARM 42X72 X-RAY (DRAPES) ×4 IMPLANT
DRAPE CHEST BREAST 15X10 FENES (DRAPES) ×4 IMPLANT
DRAPE PROXIMA HALF (DRAPES) ×2 IMPLANT
ELECT REM PT RETURN 9FT ADLT (ELECTROSURGICAL) ×4
ELECTRODE REM PT RTRN 9FT ADLT (ELECTROSURGICAL) ×2 IMPLANT
GAUZE SPONGE 2X2 8PLY STRL LF (GAUZE/BANDAGES/DRESSINGS) ×2 IMPLANT
GAUZE SPONGE 4X4 12PLY STRL (GAUZE/BANDAGES/DRESSINGS) ×4 IMPLANT
GAUZE SPONGE 4X4 16PLY XRAY LF (GAUZE/BANDAGES/DRESSINGS) ×4 IMPLANT
GEL ULTRASOUND 20GR AQUASONIC (MISCELLANEOUS) IMPLANT
GLOVE SS BIOGEL STRL SZ 7 (GLOVE) ×2 IMPLANT
GLOVE SUPERSENSE BIOGEL SZ 7 (GLOVE) ×2
GOWN STRL REUS W/ TWL LRG LVL3 (GOWN DISPOSABLE) ×6 IMPLANT
GOWN STRL REUS W/TWL LRG LVL3 (GOWN DISPOSABLE) ×12
KIT BASIN OR (CUSTOM PROCEDURE TRAY) ×4 IMPLANT
KIT ROOM TURNOVER OR (KITS) ×4 IMPLANT
LIQUID BAND (GAUZE/BANDAGES/DRESSINGS) ×7 IMPLANT
NDL 18GX1X1/2 (RX/OR ONLY) (NEEDLE) ×2 IMPLANT
NDL HYPO 25GX1X1/2 BEV (NEEDLE) ×2 IMPLANT
NEEDLE 18GX1X1/2 (RX/OR ONLY) (NEEDLE) ×4 IMPLANT
NEEDLE 22X1 1/2 (OR ONLY) (NEEDLE) ×4 IMPLANT
NEEDLE HYPO 25GX1X1/2 BEV (NEEDLE) ×4 IMPLANT
NS IRRIG 1000ML POUR BTL (IV SOLUTION) ×4 IMPLANT
PACK CV ACCESS (CUSTOM PROCEDURE TRAY) ×4 IMPLANT
PACK SURGICAL SETUP 50X90 (CUSTOM PROCEDURE TRAY) ×4 IMPLANT
PAD ARMBOARD 7.5X6 YLW CONV (MISCELLANEOUS) ×8 IMPLANT
SOAP 2 % CHG 4 OZ (WOUND CARE) ×4 IMPLANT
SPONGE GAUZE 2X2 STER 10/PKG (GAUZE/BANDAGES/DRESSINGS) ×2
SUT ETHILON 3 0 PS 1 (SUTURE) ×4 IMPLANT
SUT PROLENE 6 0 BV (SUTURE) IMPLANT
SUT SILK 0 TIES 10X30 (SUTURE) ×4 IMPLANT
SUT VIC AB 3-0 SH 27 (SUTURE) ×4
SUT VIC AB 3-0 SH 27X BRD (SUTURE) ×2 IMPLANT
SUT VICRYL 4-0 PS2 18IN ABS (SUTURE) ×4 IMPLANT
SWAB COLLECTION DEVICE MRSA (MISCELLANEOUS) IMPLANT
SYR 20CC LL (SYRINGE) ×4 IMPLANT
SYR 5ML LL (SYRINGE) ×8 IMPLANT
SYR CONTROL 10ML LL (SYRINGE) ×4 IMPLANT
SYRINGE 10CC LL (SYRINGE) ×4 IMPLANT
TAPE CLOTH SURG 4X10 WHT LF (GAUZE/BANDAGES/DRESSINGS) ×3 IMPLANT
TUBE ANAEROBIC SPECIMEN COL (MISCELLANEOUS) IMPLANT
UNDERPAD 30X30 INCONTINENT (UNDERPADS AND DIAPERS) ×4 IMPLANT
WATER STERILE IRR 1000ML POUR (IV SOLUTION) ×4 IMPLANT

## 2014-11-06 NOTE — Transfer of Care (Signed)
Immediate Anesthesia Transfer of Care Note  Patient: Aaron Long  Procedure(s) Performed: Procedure(s): LIGATION OF RADIOCEPHALIC ARTERIOVENOUS FISTULA (Right) INSERTION OF 23cm DIALYSIS CATHETER - right internal jugular (N/A)  Patient Location: PACU  Anesthesia Type:General  Level of Consciousness: awake, alert  and oriented  Airway & Oxygen Therapy: Patient Spontanous Breathing and Patient connected to nasal cannula oxygen  Post-op Assessment: Report given to RN, Post -op Vital signs reviewed and stable and Patient moving all extremities X 4  Post vital signs: Reviewed and stable  Last Vitals:  Filed Vitals:   11/06/14 0722  BP: 186/79  Pulse: 83  Temp: 37.2 C  Resp: 16   HR 82, RR 9, Sats 100%, BP 923/30  Complications: No apparent anesthesia complications

## 2014-11-06 NOTE — H&P (View-Only) (Signed)
Subjective:     Patient ID: Aaron Long, male   DOB: 12/14/1940, 74 y.o.   MRN: [id number]  HPI this 74 year old male returns for follow-up regarding his left carotid endarterectomy done a few weeks ago. He has no complaints related to this surgery. He is swallowing well and denies hoarseness. He has had no lateralizing weakness, aphasia, amaurosis fugax, diplopia, blurred vision, or syncope.   patient has a right radial cephalic AV fistula which we have been following for steal syndrome debating on whether to ligate the fistula and insert a contralateral left upper arm fistula but the patient has refused that because he did not want a fistula in the left arm. His symptoms of numbness in the right third fourth and fifth digits had been fairly stable but ten-day's ago he thrombosed the fistula. He then underwent thrombo-lysis by Dr. Graylon Gunning and [facility] and since that time he has had pain and numbness in the right hand he states. The dialysis has continued through the fistula successfully. He dialyzes Monday Wednesday Friday.   Past Medical History  Diagnosis Date  . PAD (peripheral artery disease) ([place])     a. R CEA 2007. b. Hx L BKA in 2013. c. s/p LE angioplasty in 2014. d. Hx R BKA in 2014.  . Gangrene of toe (HCC)     dry  . Neuromuscular disorder ([place])     diabetic neuropathy  . Hypertension   . Hyperlipidemia   . Peripheral neuropathy ([place])   . Constipation     takes Miralax daily  . Hemorrhoids   . Hx of colonic polyps   . Coronary artery disease     a. s/p NSTEMI/CABGx4 in 2007 - LIMA-LAD, SVG-optional diagonal, SVVG-OM, SVG-dRCA. b. Nuc 05/2011: nonischemic.  [name] BPH (benign prostatic hypertrophy)   . Abnormality of gait   . DVT (deep venous thrombosis) ([place])     a. Lower extremity DVT in 2013.  [name] Hypertrophy of prostate without urinary obstruction and other lower urinary tract symptoms (LUTS)   . Disorder of bone and cartilage, unspecified   . Lower limb amputation, below  knee   . Anemia   . Secondary hyperparathyroidism ([place])     Secondary Hyperpara- Thyroidism, Renal  . Ischemic cardiomyopathy     a. EF 40% in 2007. b. 51% by nuc 05/2011.  [name] Complication of anesthesia     "he gets delirious"  . Myocardial infarction ([place]) 2005  . Type II diabetes mellitus ([place])   . Diabetic peripheral neuropathy ([place])   . Prostate cancer ([place]) 2010  . ESRD on dialysis [facility])     M-W-F  . Kidney stones     Social History  Substance Use Topics  . Smoking status: Former Smoker -- 1.00 packs/day for 30 years    Types: Cigarettes    Quit date: 04/28/1991  . Smokeless tobacco: Never Used  . Alcohol Use: Yes     Comment: 04/17/2014 "might have a glass of wine at Christmas"    Family History  Problem Relation Age of Onset  . Coronary artery disease Neg Hx   . Anesthesia problems Neg Hx   . Hypotension Neg Hx   . Malignant hyperthermia Neg Hx   . Pseudochol deficiency Neg Hx   . Hyperlipidemia Mother   . Hypertension Mother   . Cancer Mother   . Hyperlipidemia Father   . Hypertension Father   . Kidney disease Father   . Heart disease Sister   . Alzheimer's  disease Sister   . Cancer Brother   . Other Sister     Allergies  Allergen Reactions  . Oxycodone Other (See Comments)    Hallucinations     Current outpatient prescriptions:  .  AMBULATORY NON FORMULARY MEDICATION, One Touch Ultra 2 Test Strips Sig:  Check blood sugars Three times daily to keep blood sugars under control Dx: E11.9, Disp: 100 strip, Rfl: 11 .  AMBULATORY NON FORMULARY MEDICATION, BD U/P Mini Pen Neddles 31GX5MM Use three times daily as directed DX: E11.9, Disp: 100 each, Rfl: 11 .  aspirin EC 81 MG tablet, Take 1 tablet (81 mg total) by mouth every morning., Disp: , Rfl:  .  calcium acetate (PHOSLO) 667 MG capsule, Take 1 capsule (667 mg total) by mouth 3 (three) times daily with meals., Disp: 90 capsule, Rfl: 1 .  clopidogrel (PLAVIX) 75 MG tablet, take 1 tablet by mouth once daily,  Disp: 90 tablet, Rfl: 1 .  HYDROcodone-acetaminophen (NORCO/VICODIN) 5-325 MG tablet, Take 1 tablet by mouth every 4 (four) hours as needed for moderate pain., Disp: 30 tablet, Rfl: 0 .  insulin aspart (NOVOLOG) 100 UNIT/ML FlexPen, Inject 5 Units into the skin 3 (three) times daily with meals. Inject 5 units three times a day with meals if blood sugar greater than 150 only, DX: 250.71, Disp: , Rfl:  .  Insulin Detemir (LEVEMIR) 100 UNIT/ML Pen, Inject 15 Units into the skin 2 (two) times daily., Disp: 15 mL, Rfl: 3 .  Misc. Devices [facility]) MISC, Patient needs new Wheelchair due to his being broken., Disp: 1 each, Rfl: 0 .  multivitamin (RENA-VIT) TABS tablet, Take 1 tablet by mouth at bedtime., Disp: 30 tablet, Rfl: 1 .  pravastatin (PRAVACHOL) 20 MG tablet, Take 2 tablets (40 mg total) by mouth daily., Disp: 60 tablet, Rfl: 0 .  pregabalin (LYRICA) 75 MG capsule, Take one tablet by mouth once daily for pains, Disp: 30 capsule, Rfl: 0 .  QUEtiapine (SEROQUEL) 25 MG tablet, Take 1 tablet (25 mg total) by mouth daily., Disp: 30 tablet, Rfl: 1 .  senna-docusate (SENOKOT-S) 8.6-50 MG per tablet, Take 2 tablets by mouth 2 (two) times daily., Disp: 60 tablet, Rfl: 0 .  tamsulosin (FLOMAX) 0.4 MG CAPS capsule, Take 1 capsule (0.4 mg total) by mouth daily., Disp: 30 capsule, Rfl: 3  Filed Vitals:   11/05/14 1207  BP: 158/78  Pulse: 78  Temp: 98.6 F (37 C)  TempSrc: Oral  Resp: 16  Height: 5\' 8"  (1.727 m)  Weight: 181 lb (82.101 kg)  SpO2: 96%    Body mass index is 27.53 kg/(m^2).           Review of Systems Denies active chest pain, dyspnea on exertion, PND, orthopnea, hemoptysis     Objective:   Physical Exam BP 158/78 mmHg  Pulse 78  Temp(Src) 98.6 F (37 C) (Oral)  Resp 16  Ht 5\' 8"  (1.727 m)  Wt 181 lb (82.101 kg)  BMI 27.53 kg/m2  SpO2 96%    General chronically ill-appearing male patient known apparent distress alert and oriented 3 in a wheelchair  lungs  rhonchi or wheezing Cardiovascular regular rhythm no murmurs  right upper extremity with pulse and palpable thrill in radial-cephalic AV fistula. Right hand with weak grip. Decreased sensation all digits right hand.  Left hand asymptomatic.    today I reviewed the CD supplied by [facility] which reveals patency of the fistula following the procedure with PTA near the radial-cephalic anastomosis.  I also had steal study performed in our office today.  The index of the digit in the right hand with the fistula opening was 0.36. Thi improved to 1.02 with compression of the fistula.  Consistent with steal..     Assessment:     continued steal syndrome right hand due to radial-cephalic AV fistula which worsened following recent thrombo-lysis procedure.   status post left carotid endarterectomy doing well    Plan:      plan return in 6 months for follow-up carotid duplex exam  plan ligation of right radial cephalic AV fistula tomorrow with insertion of hemodialysis catheter   patient is currently adamant about not having access in left upper extremity We'll have to discuss further options with him at a later date

## 2014-11-06 NOTE — Anesthesia Postprocedure Evaluation (Signed)
  Anesthesia Post-op Note  Patient: Aaron Long  Procedure(s) Performed: Procedure(s) (LRB): LIGATION OF RADIOCEPHALIC ARTERIOVENOUS FISTULA (Right) INSERTION OF 23cm DIALYSIS CATHETER - right internal jugular (N/A)  Patient Location: PACU  Anesthesia Type: General  Level of Consciousness: awake and alert   Airway and Oxygen Therapy: Patient Spontanous Breathing  Post-op Pain: mild  Post-op Assessment: Post-op Vital signs reviewed, Patient's Cardiovascular Status Stable, Respiratory Function Stable, Patent Airway and No signs of Nausea or vomiting  Last Vitals:  Filed Vitals:   11/06/14 1130  BP:   Pulse:   Temp: 36.2 C  Resp:     Post-op Vital Signs: stable   Complications: No apparent anesthesia complications

## 2014-11-06 NOTE — Progress Notes (Signed)
Pt ready fro dc from phase 2 awaiting ride from scat

## 2014-11-06 NOTE — Anesthesia Preprocedure Evaluation (Signed)
Anesthesia Evaluation  Patient identified by MRN, date of birth, ID band Patient awake    Reviewed: Allergy & Precautions, NPO status , Patient's Chart, lab work & pertinent test results  Airway Mallampati: II  TM Distance: >3 FB Neck ROM: Full    Dental no notable dental hx.    Pulmonary neg pulmonary ROS, former smoker,    Pulmonary exam normal breath sounds clear to auscultation       Cardiovascular hypertension, + CAD, + Past MI, + CABG, + Peripheral Vascular Disease and +CHF  Normal cardiovascular exam Rhythm:Regular Rate:Normal  Ischemic cardiomyopathy  a. EF 40% in 2007. b. 51% by nuc 05/2011.     Neuro/Psych negative neurological ROS  negative psych ROS   GI/Hepatic negative GI ROS, Neg liver ROS,   Endo/Other  diabetes  Renal/GU DialysisRenal disease  negative genitourinary   Musculoskeletal negative musculoskeletal ROS (+)   Abdominal   Peds negative pediatric ROS (+)  Hematology negative hematology ROS (+)   Anesthesia Other Findings   Reproductive/Obstetrics negative OB ROS                             Anesthesia Physical Anesthesia Plan  ASA: III  Anesthesia Plan: MAC   Post-op Pain Management:    Induction: Intravenous  Airway Management Planned: Nasal Cannula  Additional Equipment:   Intra-op Plan:   Post-operative Plan:   Informed Consent: I have reviewed the patients History and Physical, chart, labs and discussed the procedure including the risks, benefits and alternatives for the proposed anesthesia with the patient or authorized representative who has indicated his/her understanding and acceptance.   Dental advisory given  Plan Discussed with: CRNA and Surgeon  Anesthesia Plan Comments:         Anesthesia Quick Evaluation

## 2014-11-06 NOTE — Interval H&P Note (Signed)
History and Physical Interval Note:  11/06/2014 8:28 AM  Aaron Long  has presented today for surgery, with the diagnosis of End Stage Renal Disease N18.6  The various methods of treatment have been discussed with the patient and family. After consideration of risks, benefits and other options for treatment, the patient has consented to  Procedure(s): LIGATION OF RADIOCEPHALIC ARTERIOVENOUS FISTULA (Right) INSERTION OF DIALYSIS CATHETER (N/A) as a surgical intervention .  [name] history has been reviewed, patient examined, no change in status, stable for surgery.  I have reviewed the patient's chart and labs.  Questions were answered to the patient's satisfaction.     [name]

## 2014-11-06 NOTE — Anesthesia Procedure Notes (Signed)
Procedure Name: LMA Insertion Date/Time: 11/06/2014 10:19 AM Performed by: Merrilyn Puma B Pre-anesthesia Checklist: Patient identified, Timeout performed, Emergency Drugs available, Suction available and Patient being monitored Patient Re-evaluated:Patient Re-evaluated prior to inductionOxygen Delivery Method: Circle system utilized Preoxygenation: Pre-oxygenation with 100% oxygen Intubation Type: IV induction Ventilation: Mask ventilation without difficulty LMA: LMA inserted LMA Size: 5.0 Number of attempts: 1 Placement Confirmation: positive ETCO2 and breath sounds checked- equal and bilateral Tube secured with: Tape Dental Injury: Teeth and Oropharynx as per pre-operative assessment  Comments: LMA 4 inserted, but large leak present.  Exchanged for LMA 5

## 2014-11-06 NOTE — Op Note (Signed)
OPERATIVE REPORT  Date of Surgery: 11/06/2014  Surgeon: [name], MD  Assistant: Gerri Lins PA  Pre-op Diagnosis: End Stage Renal Disease with steal syndrome right arm from right radiocephalic av fistula L29.5  Post-op Diagnosis: End Stage Renal Disease with steal syndrome right arm from right radiocephalic av fistula F47.3  Procedure: Procedure(s): LIGATION OF RADIOCEPHALIC ARTERIOVENOUS FISTULA-right upper extremity INSERTION OF 23cm DIALYSIS CATHETER - right internal jugular Bilateral ultrasound localization internal jugular veins  Anesthesia: LMA  EBL: Minimal  Complications: None  Procedure Details: The patient was trying to operating room placed in the Trendelenburg position satisfactory general LMA anesthesia was mastered. Upper chest and neck were exposed. Both internal jugular lines were imaged using B-mode ultrasound-sono site and both were noted to be satisfactory and tightened. After prepping and draping in routine sterile manner the right J was entered using a supraclavicular approach Were passed into the right atrium under 4 scopic guidance. After dilating the tract appropriately a 23 cm tunneled hemodialysis catheter was positioned in the right atrium, peripherally secured with nylon sutures and wound closed with Vicryl in subcuticular fashion with the location being confirmed 4 scopic. Both ports easily flushed with heparin saline. Following this the right upper extremity was prepped and draped in routine sterile manner short incision was made to the previous scar just proximal to the wrist where the radial cephalic AV fistula had been created. The facet anastomosis was dissected free. There was excellent flow in the fistula and with compression of the fistula temporarily there was much improved radial arterial flow distally. The finding was ligated at the anastomosis with 2-0 silk tie in 2 locations and transected with resultant much improved radial arterial flow  distally. Adequate hemostasis was achieved with Dr. Benjie Karvonen from Vicryl subcuticular fashion and Dermabond patient tightened recovering satisfactorily   [name], MD 11/06/2014 11:28 AM

## 2014-11-07 ENCOUNTER — Encounter (HOSPITAL_COMMUNITY): Payer: Self-pay | Admitting: Vascular Surgery

## 2014-11-07 DIAGNOSIS — E119 Type 2 diabetes mellitus without complications: Secondary | ICD-10-CM | POA: Diagnosis not present

## 2014-11-07 DIAGNOSIS — E214 Other specified disorders of parathyroid gland: Secondary | ICD-10-CM | POA: Diagnosis not present

## 2014-11-07 DIAGNOSIS — D509 Iron deficiency anemia, unspecified: Secondary | ICD-10-CM | POA: Diagnosis not present

## 2014-11-07 DIAGNOSIS — D631 Anemia in chronic kidney disease: Secondary | ICD-10-CM | POA: Diagnosis not present

## 2014-11-07 DIAGNOSIS — N186 End stage renal disease: Secondary | ICD-10-CM | POA: Diagnosis not present

## 2014-11-08 ENCOUNTER — Telehealth: Payer: Self-pay | Admitting: Vascular Surgery

## 2014-11-08 DIAGNOSIS — D509 Iron deficiency anemia, unspecified: Secondary | ICD-10-CM | POA: Diagnosis not present

## 2014-11-08 DIAGNOSIS — E214 Other specified disorders of parathyroid gland: Secondary | ICD-10-CM | POA: Diagnosis not present

## 2014-11-08 DIAGNOSIS — D631 Anemia in chronic kidney disease: Secondary | ICD-10-CM | POA: Diagnosis not present

## 2014-11-08 DIAGNOSIS — N186 End stage renal disease: Secondary | ICD-10-CM | POA: Diagnosis not present

## 2014-11-08 NOTE — Telephone Encounter (Signed)
-----   Message from Mena Goes, RN sent at 11/06/2014 12:51 PM EDT ----- Regarding: schedule   ----- Message -----    From: Ulyses Amor, PA-C    Sent: 11/06/2014  11:24 AM      To: [facility] Charge Pool  F/U in 3 weeks with Dr. Kellie Simmering s/p diatek and av fistula ligation right UE

## 2014-11-08 NOTE — Telephone Encounter (Signed)
-----   Message from Mena Goes, RN sent at 11/06/2014 12:54 PM EDT ----- Regarding: schedule   ----- Message -----    From: Ulyses Amor, PA-C    Sent: 11/06/2014  11:56 AM      To: [facility] Charge Pool  S/P fistula ligation and placement of diatek f/u in 3 weeks needs aortoiliac duplex and left arm vein mapping at next visit. thx [facility]

## 2014-11-08 NOTE — Telephone Encounter (Signed)
Tried to call patient, but had to lm- he normally has a difficult schedule to work with, so lm to call back, dpm

## 2014-11-11 DIAGNOSIS — D631 Anemia in chronic kidney disease: Secondary | ICD-10-CM | POA: Diagnosis not present

## 2014-11-11 DIAGNOSIS — D509 Iron deficiency anemia, unspecified: Secondary | ICD-10-CM | POA: Diagnosis not present

## 2014-11-11 DIAGNOSIS — Z992 Dependence on renal dialysis: Secondary | ICD-10-CM | POA: Diagnosis not present

## 2014-11-11 DIAGNOSIS — N186 End stage renal disease: Secondary | ICD-10-CM | POA: Diagnosis not present

## 2014-11-11 DIAGNOSIS — E214 Other specified disorders of parathyroid gland: Secondary | ICD-10-CM | POA: Diagnosis not present

## 2014-11-11 DIAGNOSIS — E1122 Type 2 diabetes mellitus with diabetic chronic kidney disease: Secondary | ICD-10-CM | POA: Diagnosis not present

## 2014-11-13 DIAGNOSIS — D631 Anemia in chronic kidney disease: Secondary | ICD-10-CM | POA: Diagnosis not present

## 2014-11-13 DIAGNOSIS — E119 Type 2 diabetes mellitus without complications: Secondary | ICD-10-CM | POA: Diagnosis not present

## 2014-11-13 DIAGNOSIS — E214 Other specified disorders of parathyroid gland: Secondary | ICD-10-CM | POA: Diagnosis not present

## 2014-11-13 DIAGNOSIS — D509 Iron deficiency anemia, unspecified: Secondary | ICD-10-CM | POA: Diagnosis not present

## 2014-11-13 DIAGNOSIS — N186 End stage renal disease: Secondary | ICD-10-CM | POA: Diagnosis not present

## 2014-11-14 DIAGNOSIS — E119 Type 2 diabetes mellitus without complications: Secondary | ICD-10-CM | POA: Diagnosis not present

## 2014-11-14 DIAGNOSIS — E214 Other specified disorders of parathyroid gland: Secondary | ICD-10-CM | POA: Diagnosis not present

## 2014-11-14 DIAGNOSIS — D631 Anemia in chronic kidney disease: Secondary | ICD-10-CM | POA: Diagnosis not present

## 2014-11-14 DIAGNOSIS — D509 Iron deficiency anemia, unspecified: Secondary | ICD-10-CM | POA: Diagnosis not present

## 2014-11-14 DIAGNOSIS — N186 End stage renal disease: Secondary | ICD-10-CM | POA: Diagnosis not present

## 2014-11-16 ENCOUNTER — Other Ambulatory Visit: Payer: Self-pay | Admitting: Internal Medicine

## 2014-11-18 DIAGNOSIS — N186 End stage renal disease: Secondary | ICD-10-CM | POA: Diagnosis not present

## 2014-11-18 DIAGNOSIS — E119 Type 2 diabetes mellitus without complications: Secondary | ICD-10-CM | POA: Diagnosis not present

## 2014-11-18 DIAGNOSIS — D631 Anemia in chronic kidney disease: Secondary | ICD-10-CM | POA: Diagnosis not present

## 2014-11-18 DIAGNOSIS — D509 Iron deficiency anemia, unspecified: Secondary | ICD-10-CM | POA: Diagnosis not present

## 2014-11-18 DIAGNOSIS — E214 Other specified disorders of parathyroid gland: Secondary | ICD-10-CM | POA: Diagnosis not present

## 2014-11-20 DIAGNOSIS — D631 Anemia in chronic kidney disease: Secondary | ICD-10-CM | POA: Diagnosis not present

## 2014-11-20 DIAGNOSIS — E214 Other specified disorders of parathyroid gland: Secondary | ICD-10-CM | POA: Diagnosis not present

## 2014-11-20 DIAGNOSIS — E119 Type 2 diabetes mellitus without complications: Secondary | ICD-10-CM | POA: Diagnosis not present

## 2014-11-20 DIAGNOSIS — D509 Iron deficiency anemia, unspecified: Secondary | ICD-10-CM | POA: Diagnosis not present

## 2014-11-20 DIAGNOSIS — N186 End stage renal disease: Secondary | ICD-10-CM | POA: Diagnosis not present

## 2014-11-22 DIAGNOSIS — D631 Anemia in chronic kidney disease: Secondary | ICD-10-CM | POA: Diagnosis not present

## 2014-11-22 DIAGNOSIS — E119 Type 2 diabetes mellitus without complications: Secondary | ICD-10-CM | POA: Diagnosis not present

## 2014-11-22 DIAGNOSIS — N186 End stage renal disease: Secondary | ICD-10-CM | POA: Diagnosis not present

## 2014-11-22 DIAGNOSIS — D509 Iron deficiency anemia, unspecified: Secondary | ICD-10-CM | POA: Diagnosis not present

## 2014-11-22 DIAGNOSIS — E214 Other specified disorders of parathyroid gland: Secondary | ICD-10-CM | POA: Diagnosis not present

## 2014-11-25 DIAGNOSIS — E214 Other specified disorders of parathyroid gland: Secondary | ICD-10-CM | POA: Diagnosis not present

## 2014-11-25 DIAGNOSIS — D509 Iron deficiency anemia, unspecified: Secondary | ICD-10-CM | POA: Diagnosis not present

## 2014-11-25 DIAGNOSIS — D631 Anemia in chronic kidney disease: Secondary | ICD-10-CM | POA: Diagnosis not present

## 2014-11-25 DIAGNOSIS — E119 Type 2 diabetes mellitus without complications: Secondary | ICD-10-CM | POA: Diagnosis not present

## 2014-11-25 DIAGNOSIS — N186 End stage renal disease: Secondary | ICD-10-CM | POA: Diagnosis not present

## 2014-11-26 ENCOUNTER — Telehealth: Payer: Self-pay

## 2014-11-26 DIAGNOSIS — G8918 Other acute postprocedural pain: Secondary | ICD-10-CM

## 2014-11-26 MED ORDER — HYDROCODONE-ACETAMINOPHEN 5-325 MG PO TABS: 1.0000 | ORAL_TABLET | Freq: Four times a day (QID) | ORAL | Status: DC | PRN

## 2014-11-26 NOTE — Telephone Encounter (Signed)
Discussed pt. Symptoms with Dr. Kellie Simmering.  Recommended to give pt. Refill on Hydrocodone/ Acetaminophen 5/325 mg; #30; no refills.  Advise pt. to gradually transition from the narcotic pain medication to OTC analgesic, to prevent dependency on the Hydrocodone.  Phone call to pt's wife.  Advised of Dr. Evelena Leyden recommendations and to encourage pt. To transition over to the non-narcotic analgesics available OTC.  Adv. To pick up written RX at the front dest. Verb. Understanding.

## 2014-11-26 NOTE — Telephone Encounter (Signed)
Wife called for pt.  Requested refill on pain medication.  Reported the pt. C/o right arm aching.  Stated his hand still feels cool, and he c/o weakness of right arm and unable to pick up a glass with it.  Reported the right arm incision looks good.  Denied any redness/ drainage, fever/ chills.  Advised will discuss with Dr. Kellie Simmering.

## 2014-11-27 DIAGNOSIS — D509 Iron deficiency anemia, unspecified: Secondary | ICD-10-CM | POA: Diagnosis not present

## 2014-11-27 DIAGNOSIS — N186 End stage renal disease: Secondary | ICD-10-CM | POA: Diagnosis not present

## 2014-11-27 DIAGNOSIS — D631 Anemia in chronic kidney disease: Secondary | ICD-10-CM | POA: Diagnosis not present

## 2014-11-27 DIAGNOSIS — E119 Type 2 diabetes mellitus without complications: Secondary | ICD-10-CM | POA: Diagnosis not present

## 2014-11-27 DIAGNOSIS — E214 Other specified disorders of parathyroid gland: Secondary | ICD-10-CM | POA: Diagnosis not present

## 2014-11-28 ENCOUNTER — Encounter: Payer: Self-pay | Admitting: Vascular Surgery

## 2014-11-29 ENCOUNTER — Other Ambulatory Visit: Payer: Self-pay | Admitting: *Deleted

## 2014-11-29 DIAGNOSIS — D631 Anemia in chronic kidney disease: Secondary | ICD-10-CM | POA: Diagnosis not present

## 2014-11-29 DIAGNOSIS — N186 End stage renal disease: Secondary | ICD-10-CM | POA: Diagnosis not present

## 2014-11-29 DIAGNOSIS — E214 Other specified disorders of parathyroid gland: Secondary | ICD-10-CM | POA: Diagnosis not present

## 2014-11-29 DIAGNOSIS — D509 Iron deficiency anemia, unspecified: Secondary | ICD-10-CM | POA: Diagnosis not present

## 2014-11-29 DIAGNOSIS — E119 Type 2 diabetes mellitus without complications: Secondary | ICD-10-CM | POA: Diagnosis not present

## 2014-11-29 MED ORDER — AMLODIPINE BESYLATE 10 MG PO TABS: 10.0000 mg | ORAL_TABLET | Freq: Every day | ORAL | Status: DC

## 2014-12-02 ENCOUNTER — Ambulatory Visit (INDEPENDENT_AMBULATORY_CARE_PROVIDER_SITE_OTHER)
Admission: RE | Admit: 2014-12-02 | Discharge: 2014-12-02 | Disposition: A | Discharge status: Home / Self Care | Payer: Medicare Other | Source: Ambulatory Visit | Attending: Vascular Surgery | Admitting: Vascular Surgery

## 2014-12-02 ENCOUNTER — Ambulatory Visit (HOSPITAL_COMMUNITY)
Admission: RE | Admit: 2014-12-02 | Discharge: 2014-12-02 | Disposition: A | Discharge status: Home / Self Care | Payer: Medicare Other | Source: Ambulatory Visit | Attending: Vascular Surgery | Admitting: Vascular Surgery

## 2014-12-02 DIAGNOSIS — E119 Type 2 diabetes mellitus without complications: Secondary | ICD-10-CM | POA: Diagnosis not present

## 2014-12-02 DIAGNOSIS — I739 Peripheral vascular disease, unspecified: Secondary | ICD-10-CM

## 2014-12-02 DIAGNOSIS — N186 End stage renal disease: Secondary | ICD-10-CM | POA: Insufficient documentation

## 2014-12-02 DIAGNOSIS — D509 Iron deficiency anemia, unspecified: Secondary | ICD-10-CM | POA: Diagnosis not present

## 2014-12-02 DIAGNOSIS — Z01810 Encounter for preprocedural cardiovascular examination: Secondary | ICD-10-CM

## 2014-12-02 DIAGNOSIS — E214 Other specified disorders of parathyroid gland: Secondary | ICD-10-CM | POA: Diagnosis not present

## 2014-12-02 DIAGNOSIS — D631 Anemia in chronic kidney disease: Secondary | ICD-10-CM | POA: Diagnosis not present

## 2014-12-03 ENCOUNTER — Ambulatory Visit (INDEPENDENT_AMBULATORY_CARE_PROVIDER_SITE_OTHER): Payer: Medicare Other | Admitting: Vascular Surgery

## 2014-12-03 ENCOUNTER — Other Ambulatory Visit: Payer: Self-pay

## 2014-12-03 ENCOUNTER — Encounter: Payer: Self-pay | Admitting: Vascular Surgery

## 2014-12-03 VITALS — BP 182/76 | HR 80 | Ht 68.0 in | Wt 181.0 lb

## 2014-12-03 DIAGNOSIS — N186 End stage renal disease: Secondary | ICD-10-CM

## 2014-12-03 NOTE — Progress Notes (Signed)
Subjective:     Patient ID: Aaron Long, male   DOB: 08-Aug-1940, 74 y.o.   MRN: [id number]  HPI this 74 year old male with end-stage renal disease has hemodialysis on Monday Wednesday Friday. He recently had a right radial-cephalic AV fistula ligated because of steal syndrome. The fistula was created in April 2016. Currently he is being dialyzed through a right IJ catheter. He is left-handed. He is seen today for vascular access evaluation. He has bilateral below-knee amputations.  Past Medical History  Diagnosis Date  . PAD (peripheral artery disease) ([place])     a. R CEA 2007. b. Hx L BKA in 2013. c. s/p LE angioplasty in 2014. d. Hx R BKA in 2014.  . Gangrene of toe (HCC)     dry  . Neuromuscular disorder ([place])     diabetic neuropathy  . Hypertension   . Hyperlipidemia   . Peripheral neuropathy ([place])   . Constipation     takes Miralax daily  . Hemorrhoids   . Hx of colonic polyps   . Coronary artery disease     a. s/p NSTEMI/CABGx4 in 2007 - LIMA-LAD, SVG-optional diagonal, SVVG-OM, SVG-dRCA. b. Nuc 05/2011: nonischemic.  [name] BPH (benign prostatic hypertrophy)   . Abnormality of gait   . DVT (deep venous thrombosis) ([place])     a. Lower extremity DVT in 2013.  [name] Hypertrophy of prostate without urinary obstruction and other lower urinary tract symptoms (LUTS)   . Disorder of bone and cartilage, unspecified   . Lower limb amputation, below knee   . Anemia   . Secondary hyperparathyroidism ([place])     Secondary Hyperpara- Thyroidism, Renal  . Ischemic cardiomyopathy     a. EF 40% in 2007. b. 51% by nuc 05/2011.  [name] Complication of anesthesia     "he gets delirious"  . Myocardial infarction ([place]) 2005  . Type II diabetes mellitus ([place])   . Diabetic peripheral neuropathy ([place])   . Prostate cancer ([place]) 2010  . ESRD on dialysis [facility])     M-W-F  . Kidney stones     Social History  Substance Use Topics  . Smoking status: Former Smoker -- 1.00 packs/day for 30 years    Types:  Cigarettes    Quit date: 04/28/1991  . Smokeless tobacco: Never Used  . Alcohol Use: Yes     Comment: 04/17/2014 "might have a glass of wine at Christmas"    Family History  Problem Relation Age of Onset  . Coronary artery disease Neg Hx   . Anesthesia problems Neg Hx   . Hypotension Neg Hx   . Malignant hyperthermia Neg Hx   . Pseudochol deficiency Neg Hx   . Hyperlipidemia Mother   . Hypertension Mother   . Cancer Mother   . Hyperlipidemia Father   . Hypertension Father   . Kidney disease Father   . Heart disease Sister   . Alzheimer's disease Sister   . Cancer Brother   . Other Sister     Allergies  Allergen Reactions  . Oxycodone Other (See Comments)    Hallucinations     Current outpatient prescriptions:  .  AMBULATORY NON FORMULARY MEDICATION, One Touch Ultra 2 Test Strips Sig:  Check blood sugars Three times daily to keep blood sugars under control Dx: E11.9, Disp: 100 strip, Rfl: 11 .  AMBULATORY NON FORMULARY MEDICATION, BD U/P Mini Pen Neddles 31GX5MM Use three times daily as directed DX: E11.9, Disp: 100 each, Rfl: 11 .  amLODipine (NORVASC)  10 MG tablet, Take 1 tablet (10 mg total) by mouth daily., Disp: 30 tablet, Rfl: 2 .  aspirin EC 81 MG tablet, Take 1 tablet (81 mg total) by mouth every morning., Disp: , Rfl:  .  calcium acetate (PHOSLO) 667 MG capsule, Take 1 capsule (667 mg total) by mouth 3 (three) times daily with meals., Disp: 90 capsule, Rfl: 1 .  clopidogrel (PLAVIX) 75 MG tablet, take 1 tablet by mouth once daily, Disp: 90 tablet, Rfl: 1 .  HYDROcodone-acetaminophen (NORCO/VICODIN) 5-325 MG tablet, Take 1 tablet by mouth every 6 (six) hours as needed for moderate pain., Disp: 30 tablet, Rfl: 0 .  insulin aspart (NOVOLOG) 100 UNIT/ML FlexPen, Inject 5 Units into the skin 3 (three) times daily with meals. Inject 5 units three times a day with meals if blood sugar greater than 150 only, DX: 250.71, Disp: , Rfl:  .  Insulin Detemir (LEVEMIR) 100 UNIT/ML  Pen, Inject 15 Units into the skin 2 (two) times daily., Disp: 15 mL, Rfl: 3 .  Misc. Devices [facility]) MISC, Patient needs new Wheelchair due to his being broken., Disp: 1 each, Rfl: 0 .  multivitamin (RENA-VIT) TABS tablet, Take 1 tablet by mouth at bedtime., Disp: 30 tablet, Rfl: 1 .  pravastatin (PRAVACHOL) 20 MG tablet, Take 2 tablets (40 mg total) by mouth daily., Disp: 60 tablet, Rfl: 0 .  pregabalin (LYRICA) 75 MG capsule, Take one tablet by mouth once daily for pains, Disp: 30 capsule, Rfl: 0 .  QUEtiapine (SEROQUEL) 25 MG tablet, Take 1 tablet (25 mg total) by mouth daily., Disp: 30 tablet, Rfl: 1 .  senna-docusate (SENOKOT-S) 8.6-50 MG per tablet, Take 2 tablets by mouth 2 (two) times daily., Disp: 60 tablet, Rfl: 0 .  tamsulosin (FLOMAX) 0.4 MG CAPS capsule, Take 1 capsule (0.4 mg total) by mouth daily., Disp: 30 capsule, Rfl: 3  Filed Vitals:   12/03/14 0849 12/03/14 0852  BP: 173/79 182/76  Pulse: 80   Height: 5\' 8"  (1.727 m)   Weight: 181 lb (82.101 kg)   SpO2: 97%     Body mass index is 27.53 kg/(m^2).           Review of Systems denies chest pain. Does have history of diabetic neuropathy. Has bilateral below-knee amputations. Status post bilateral carotid endarterectomies.     Objective:   Physical Exam BP 182/76 mmHg  Pulse 80  Ht 5\' 8"  (1.727 m)  Wt 181 lb (82.101 kg)  BMI 27.53 kg/m2  SpO2 97%  Gen.-alert and oriented x3 in no apparent distress HEENT normal for age Lungs no rhonchi or wheezing Cardiovascular regular rhythm no murmurs carotid pulses 3+ palpable no bruits audible Abdomen soft nontender no palpable masses Musculoskeletal free of  major deformities Skin clear -no rashes Neurologic normal Lower extremities bilateral below-knee dictations. Left upper extremity with 2+ radial pulse palpable. Cephalic vein appears adequate with sono site examination from the antecubital area to the shoulder. Right hand has reasonably good grip but  patient still complains of numbness when on dialysis.  I reviewed the upper extremity vein mapping which was performed recently in our office and this reveals adequate cephalic vein in the left upper arm but not in the forearm      Assessment:     End-stage renal disease needs vascular access History of steel syndrome right upper extremity with resultant ligation right radial cephalic AV fistula and persistent discomfort and numbness while on dialysis Bilateral below-knee amputations History of diabetic neuropathy  Plan:     Plan left brachial-cephalic AV fistula on Thursday, December 1 Discussed at length with patient and his daughter the fact that he could develop steal syndrome and left arm as well and we will need to watch this closely regarding possible ligation of fistula. If that should occur he will need graft in one of his lower extremities. Patient understands and would like to proceed

## 2014-12-04 DIAGNOSIS — D631 Anemia in chronic kidney disease: Secondary | ICD-10-CM | POA: Diagnosis not present

## 2014-12-04 DIAGNOSIS — E119 Type 2 diabetes mellitus without complications: Secondary | ICD-10-CM | POA: Diagnosis not present

## 2014-12-04 DIAGNOSIS — D509 Iron deficiency anemia, unspecified: Secondary | ICD-10-CM | POA: Diagnosis not present

## 2014-12-04 DIAGNOSIS — E214 Other specified disorders of parathyroid gland: Secondary | ICD-10-CM | POA: Diagnosis not present

## 2014-12-04 DIAGNOSIS — N186 End stage renal disease: Secondary | ICD-10-CM | POA: Diagnosis not present

## 2014-12-07 DIAGNOSIS — E214 Other specified disorders of parathyroid gland: Secondary | ICD-10-CM | POA: Diagnosis not present

## 2014-12-07 DIAGNOSIS — N186 End stage renal disease: Secondary | ICD-10-CM | POA: Diagnosis not present

## 2014-12-07 DIAGNOSIS — E119 Type 2 diabetes mellitus without complications: Secondary | ICD-10-CM | POA: Diagnosis not present

## 2014-12-07 DIAGNOSIS — D631 Anemia in chronic kidney disease: Secondary | ICD-10-CM | POA: Diagnosis not present

## 2014-12-07 DIAGNOSIS — D509 Iron deficiency anemia, unspecified: Secondary | ICD-10-CM | POA: Diagnosis not present

## 2014-12-09 ENCOUNTER — Other Ambulatory Visit: Payer: Self-pay | Admitting: *Deleted

## 2014-12-09 DIAGNOSIS — D631 Anemia in chronic kidney disease: Secondary | ICD-10-CM | POA: Diagnosis not present

## 2014-12-09 DIAGNOSIS — E119 Type 2 diabetes mellitus without complications: Secondary | ICD-10-CM | POA: Diagnosis not present

## 2014-12-09 DIAGNOSIS — D509 Iron deficiency anemia, unspecified: Secondary | ICD-10-CM | POA: Diagnosis not present

## 2014-12-09 DIAGNOSIS — N186 End stage renal disease: Secondary | ICD-10-CM | POA: Diagnosis not present

## 2014-12-09 DIAGNOSIS — E214 Other specified disorders of parathyroid gland: Secondary | ICD-10-CM | POA: Diagnosis not present

## 2014-12-09 MED ORDER — PREGABALIN 75 MG PO CAPS: ORAL_CAPSULE | ORAL | Status: DC

## 2014-12-09 NOTE — Telephone Encounter (Signed)
[facility] [place] 

## 2014-12-11 DIAGNOSIS — E1122 Type 2 diabetes mellitus with diabetic chronic kidney disease: Secondary | ICD-10-CM | POA: Diagnosis not present

## 2014-12-11 DIAGNOSIS — Z992 Dependence on renal dialysis: Secondary | ICD-10-CM | POA: Diagnosis not present

## 2014-12-11 DIAGNOSIS — N186 End stage renal disease: Secondary | ICD-10-CM | POA: Diagnosis not present

## 2014-12-11 MED ORDER — DEXTROSE 5 % IV SOLN
1.5000 g | INTRAVENOUS | Status: AC
  Administered 2014-12-12: 1.5 g via INTRAVENOUS
  Filled 2014-12-11: qty 1.5

## 2014-12-11 MED ORDER — CHLORHEXIDINE GLUCONATE CLOTH 2 % EX PADS: 6.0000 | MEDICATED_PAD | Freq: Once | CUTANEOUS | Status: DC

## 2014-12-11 MED ORDER — SODIUM CHLORIDE 0.9 % IV SOLN: INTRAVENOUS | Status: DC

## 2014-12-11 NOTE — Progress Notes (Addendum)
Spoke with pt's wife [name] for pre-op call. She states pt has not had any recent chest pain or sob.   Pt's wife states the office has told her in the past to give pt 7 units of his Levemir insulin tonight and again in the AM, which is half of his prescribed dose.  She states that she and Mr. Keath will be arriving via the [facility] bus and cannot get here until approx. 6:30 AM. I've paged Dr. Kellie Simmering to see if that is ok with him since surgery start time is at 7:30 AM. Pt is also due to have dialysis at 12 noon tomorrow. Wife is concerned about getting pt to dialysis on time.

## 2014-12-11 NOTE — Progress Notes (Signed)
Dr. Kellie Simmering returned my page and made aware of the arrival time. He stated he will leave the schedule as is and as long as the pt can be here by 6:30 AM it should be ok.

## 2014-12-12 ENCOUNTER — Encounter (HOSPITAL_COMMUNITY): Payer: Self-pay | Admitting: *Deleted

## 2014-12-12 ENCOUNTER — Other Ambulatory Visit: Payer: Self-pay | Admitting: *Deleted

## 2014-12-12 ENCOUNTER — Ambulatory Visit (HOSPITAL_COMMUNITY)
Admission: RE | Admit: 2014-12-12 | Discharge: 2014-12-12 | Disposition: A | Discharge status: Home / Self Care | Payer: Medicare Other | Source: Ambulatory Visit | Attending: Vascular Surgery | Admitting: Vascular Surgery

## 2014-12-12 ENCOUNTER — Ambulatory Visit (HOSPITAL_COMMUNITY): Payer: Medicare Other | Admitting: Anesthesiology

## 2014-12-12 ENCOUNTER — Ambulatory Visit: Payer: Medicare Other | Admitting: Nurse Practitioner

## 2014-12-12 ENCOUNTER — Encounter (HOSPITAL_COMMUNITY)
Admission: RE | Disposition: A | Discharge status: Home / Self Care | Payer: Self-pay | Source: Ambulatory Visit | Attending: Vascular Surgery

## 2014-12-12 DIAGNOSIS — N186 End stage renal disease: Secondary | ICD-10-CM

## 2014-12-12 DIAGNOSIS — E785 Hyperlipidemia, unspecified: Secondary | ICD-10-CM | POA: Insufficient documentation

## 2014-12-12 DIAGNOSIS — I2510 Atherosclerotic heart disease of native coronary artery without angina pectoris: Secondary | ICD-10-CM | POA: Insufficient documentation

## 2014-12-12 DIAGNOSIS — Z951 Presence of aortocoronary bypass graft: Secondary | ICD-10-CM | POA: Insufficient documentation

## 2014-12-12 DIAGNOSIS — Z4931 Encounter for adequacy testing for hemodialysis: Secondary | ICD-10-CM

## 2014-12-12 DIAGNOSIS — Z87891 Personal history of nicotine dependence: Secondary | ICD-10-CM | POA: Insufficient documentation

## 2014-12-12 DIAGNOSIS — Z7982 Long term (current) use of aspirin: Secondary | ICD-10-CM | POA: Diagnosis not present

## 2014-12-12 DIAGNOSIS — Z794 Long term (current) use of insulin: Secondary | ICD-10-CM | POA: Insufficient documentation

## 2014-12-12 DIAGNOSIS — Z992 Dependence on renal dialysis: Secondary | ICD-10-CM | POA: Diagnosis not present

## 2014-12-12 DIAGNOSIS — E1142 Type 2 diabetes mellitus with diabetic polyneuropathy: Secondary | ICD-10-CM | POA: Insufficient documentation

## 2014-12-12 DIAGNOSIS — Z86718 Personal history of other venous thrombosis and embolism: Secondary | ICD-10-CM | POA: Diagnosis not present

## 2014-12-12 DIAGNOSIS — Z79899 Other long term (current) drug therapy: Secondary | ICD-10-CM | POA: Insufficient documentation

## 2014-12-12 DIAGNOSIS — Z89511 Acquired absence of right leg below knee: Secondary | ICD-10-CM | POA: Diagnosis not present

## 2014-12-12 DIAGNOSIS — I252 Old myocardial infarction: Secondary | ICD-10-CM | POA: Diagnosis not present

## 2014-12-12 DIAGNOSIS — N2581 Secondary hyperparathyroidism of renal origin: Secondary | ICD-10-CM | POA: Insufficient documentation

## 2014-12-12 DIAGNOSIS — E1122 Type 2 diabetes mellitus with diabetic chronic kidney disease: Secondary | ICD-10-CM | POA: Insufficient documentation

## 2014-12-12 DIAGNOSIS — D631 Anemia in chronic kidney disease: Secondary | ICD-10-CM | POA: Diagnosis not present

## 2014-12-12 DIAGNOSIS — Z8546 Personal history of malignant neoplasm of prostate: Secondary | ICD-10-CM | POA: Insufficient documentation

## 2014-12-12 DIAGNOSIS — E119 Type 2 diabetes mellitus without complications: Secondary | ICD-10-CM | POA: Diagnosis not present

## 2014-12-12 DIAGNOSIS — D509 Iron deficiency anemia, unspecified: Secondary | ICD-10-CM | POA: Diagnosis not present

## 2014-12-12 DIAGNOSIS — E214 Other specified disorders of parathyroid gland: Secondary | ICD-10-CM | POA: Diagnosis not present

## 2014-12-12 DIAGNOSIS — Z89512 Acquired absence of left leg below knee: Secondary | ICD-10-CM | POA: Insufficient documentation

## 2014-12-12 DIAGNOSIS — I739 Peripheral vascular disease, unspecified: Secondary | ICD-10-CM | POA: Diagnosis not present

## 2014-12-12 DIAGNOSIS — I120 Hypertensive chronic kidney disease with stage 5 chronic kidney disease or end stage renal disease: Secondary | ICD-10-CM | POA: Insufficient documentation

## 2014-12-12 DIAGNOSIS — N185 Chronic kidney disease, stage 5: Secondary | ICD-10-CM | POA: Diagnosis not present

## 2014-12-12 DIAGNOSIS — G8918 Other acute postprocedural pain: Secondary | ICD-10-CM

## 2014-12-12 DIAGNOSIS — D649 Anemia, unspecified: Secondary | ICD-10-CM | POA: Diagnosis not present

## 2014-12-12 HISTORY — PX: AV FISTULA PLACEMENT: SHX1204

## 2014-12-12 LAB — POCT I-STAT 4, (NA,K, GLUC, HGB,HCT)
GLUCOSE: 364 mg/dL — AB (ref 65–99)
HCT: 38 % — ABNORMAL LOW (ref 39.0–52.0)
HEMOGLOBIN: 12.9 g/dL — AB (ref 13.0–17.0)
Potassium: 4.4 mmol/L (ref 3.5–5.1)
Sodium: 134 mmol/L — ABNORMAL LOW (ref 135–145)

## 2014-12-12 LAB — GLUCOSE, CAPILLARY
Glucose-Capillary: 318 mg/dL — ABNORMAL HIGH (ref 65–99)
Glucose-Capillary: 201 mg/dL — ABNORMAL HIGH (ref 65–99)

## 2014-12-12 LAB — PROTIME-INR
INR: 0.94 (ref 0.00–1.49)
PROTHROMBIN TIME: 12.8 s (ref 11.6–15.2)

## 2014-12-12 SURGERY — ARTERIOVENOUS (AV) FISTULA CREATION
Anesthesia: General | Site: Arm Upper | Laterality: Left

## 2014-12-12 MED ORDER — INSULIN ASPART 100 UNIT/ML ~~LOC~~ SOLN
SUBCUTANEOUS | Status: AC
  Administered 2014-12-12: 5 [IU] via SUBCUTANEOUS
  Filled 2014-12-12: qty 1

## 2014-12-12 MED ORDER — FENTANYL CITRATE (PF) 250 MCG/5ML IJ SOLN
INTRAMUSCULAR | Status: AC
  Filled 2014-12-12: qty 5

## 2014-12-12 MED ORDER — FENTANYL CITRATE (PF) 100 MCG/2ML IJ SOLN: 25.0000 ug | INTRAMUSCULAR | Status: DC | PRN

## 2014-12-12 MED ORDER — INSULIN ASPART 100 UNIT/ML ~~LOC~~ SOLN
SUBCUTANEOUS | Status: AC
  Filled 2014-12-12: qty 1

## 2014-12-12 MED ORDER — MIDAZOLAM HCL 2 MG/2ML IJ SOLN
INTRAMUSCULAR | Status: AC
  Filled 2014-12-12: qty 2

## 2014-12-12 MED ORDER — PROPOFOL 10 MG/ML IV BOLUS
INTRAVENOUS | Status: AC
  Filled 2014-12-12: qty 20

## 2014-12-12 MED ORDER — PROPOFOL 500 MG/50ML IV EMUL
INTRAVENOUS | Status: DC | PRN
  Administered 2014-12-12: 25 ug/kg/min via INTRAVENOUS

## 2014-12-12 MED ORDER — FENTANYL CITRATE (PF) 100 MCG/2ML IJ SOLN
INTRAMUSCULAR | Status: DC | PRN
  Administered 2014-12-12: 50 ug via INTRAVENOUS

## 2014-12-12 MED ORDER — LIDOCAINE-EPINEPHRINE (PF) 1 %-1:200000 IJ SOLN
INTRAMUSCULAR | Status: DC | PRN
  Administered 2014-12-12: 23 mL via INTRADERMAL

## 2014-12-12 MED ORDER — SODIUM CHLORIDE 0.9 % IV SOLN
INTRAVENOUS | Status: DC | PRN
  Administered 2014-12-12: 10:00:00

## 2014-12-12 MED ORDER — LIDOCAINE HCL (CARDIAC) 20 MG/ML IV SOLN
INTRAVENOUS | Status: DC | PRN
  Administered 2014-12-12: 40 mg via INTRAVENOUS

## 2014-12-12 MED ORDER — MIDAZOLAM HCL 5 MG/5ML IJ SOLN
INTRAMUSCULAR | Status: DC | PRN
  Administered 2014-12-12 (×2): 1 mg via INTRAVENOUS

## 2014-12-12 MED ORDER — PHENYLEPHRINE HCL 10 MG/ML IJ SOLN
INTRAMUSCULAR | Status: DC | PRN
  Administered 2014-12-12: 40 ug via INTRAVENOUS
  Administered 2014-12-12: 120 ug via INTRAVENOUS

## 2014-12-12 MED ORDER — SODIUM CHLORIDE 0.9 % IV SOLN
INTRAVENOUS | Status: DC
  Administered 2014-12-12 (×2): via INTRAVENOUS

## 2014-12-12 MED ORDER — 0.9 % SODIUM CHLORIDE (POUR BTL) OPTIME
TOPICAL | Status: DC | PRN
  Administered 2014-12-12: 1000 mL

## 2014-12-12 MED ORDER — INSULIN ASPART 100 UNIT/ML ~~LOC~~ SOLN
10.0000 [IU] | Freq: Once | SUBCUTANEOUS | Status: AC
  Administered 2014-12-12: 10 [IU] via SUBCUTANEOUS
  Filled 2014-12-12: qty 1

## 2014-12-12 MED ORDER — ALBUMIN HUMAN 5 % IV SOLN
12.5000 g | Freq: Once | INTRAVENOUS | Status: AC
  Administered 2014-12-12: 12.5 g via INTRAVENOUS

## 2014-12-12 MED ORDER — HYDROCODONE-ACETAMINOPHEN 5-325 MG PO TABS: 1.0000 | ORAL_TABLET | Freq: Four times a day (QID) | ORAL | Status: DC | PRN

## 2014-12-12 MED ORDER — ALBUMIN HUMAN 5 % IV SOLN
INTRAVENOUS | Status: AC
  Filled 2014-12-12: qty 250

## 2014-12-12 MED ORDER — LIDOCAINE-EPINEPHRINE (PF) 1 %-1:200000 IJ SOLN
INTRAMUSCULAR | Status: AC
  Filled 2014-12-12: qty 30

## 2014-12-12 SURGICAL SUPPLY — 34 items
ARMBAND PINK RESTRICT EXTREMIT (MISCELLANEOUS) ×3 IMPLANT
CANISTER SUCTION 2500CC (MISCELLANEOUS) ×3 IMPLANT
CATH EMB 3FR 80CM (CATHETERS) ×3 IMPLANT
CLIP TI MEDIUM 6 (CLIP) ×3 IMPLANT
CLIP TI WIDE RED SMALL 6 (CLIP) ×3 IMPLANT
COVER PROBE W GEL 5X96 (DRAPES) IMPLANT
DRAIN PENROSE 1/4X12 LTX STRL (WOUND CARE) ×3 IMPLANT
ELECT REM PT RETURN 9FT ADLT (ELECTROSURGICAL) ×3
ELECTRODE REM PT RTRN 9FT ADLT (ELECTROSURGICAL) ×1 IMPLANT
GEL ULTRASOUND 20GR AQUASONIC (MISCELLANEOUS) IMPLANT
GLOVE BIO SURGEON STRL SZ 6.5 (GLOVE) ×4 IMPLANT
GLOVE BIO SURGEON STRL SZ7.5 (GLOVE) ×2 IMPLANT
GLOVE BIO SURGEONS STRL SZ 6.5 (GLOVE) ×4
GLOVE BIOGEL PI IND STRL 6.5 (GLOVE) ×1 IMPLANT
GLOVE BIOGEL PI IND STRL 8 (GLOVE) IMPLANT
GLOVE BIOGEL PI INDICATOR 6.5 (GLOVE) ×2
GLOVE BIOGEL PI INDICATOR 8 (GLOVE) ×2
GLOVE SS BIOGEL STRL SZ 7 (GLOVE) ×1 IMPLANT
GLOVE SUPERSENSE BIOGEL SZ 7 (GLOVE) ×2
GOWN STRL REUS W/ TWL LRG LVL3 (GOWN DISPOSABLE) ×3 IMPLANT
GOWN STRL REUS W/TWL LRG LVL3 (GOWN DISPOSABLE) ×15
KIT BASIN OR (CUSTOM PROCEDURE TRAY) ×3 IMPLANT
KIT ROOM TURNOVER OR (KITS) ×3 IMPLANT
LIQUID BAND (GAUZE/BANDAGES/DRESSINGS) ×3 IMPLANT
NS IRRIG 1000ML POUR BTL (IV SOLUTION) ×3 IMPLANT
PACK CV ACCESS (CUSTOM PROCEDURE TRAY) ×3 IMPLANT
PAD ARMBOARD 7.5X6 YLW CONV (MISCELLANEOUS) ×6 IMPLANT
SUT PROLENE 6 0 BV (SUTURE) ×5 IMPLANT
SUT VIC AB 3-0 SH 27 (SUTURE) ×9
SUT VIC AB 3-0 SH 27X BRD (SUTURE) ×1 IMPLANT
SUT VICRYL 4-0 PS2 18IN ABS (SUTURE) ×2 IMPLANT
SYR TB 1ML LUER SLIP (SYRINGE) ×2 IMPLANT
UNDERPAD 30X30 INCONTINENT (UNDERPADS AND DIAPERS) ×3 IMPLANT
WATER STERILE IRR 1000ML POUR (IV SOLUTION) ×3 IMPLANT

## 2014-12-12 NOTE — Transfer of Care (Signed)
Immediate Anesthesia Transfer of Care Note  Patient: Aaron Long  Procedure(s) Performed: Procedure(s): BRACHIOCEPHALIC ARTERIOVENOUS (AV) FISTULA CREATION (Left)  Patient Location: PACU  Anesthesia Type:MAC  Level of Consciousness: awake, alert  and oriented  Airway & Oxygen Therapy: Patient Spontanous Breathing and Patient connected to nasal cannula oxygen  Post-op Assessment: Report given to RN, Post -op Vital signs reviewed and stable and Patient moving all extremities X 4  Post vital signs: Reviewed and stable  Last Vitals:  Filed Vitals:   12/12/14 1215 12/12/14 1230  BP: 138/64 137/65  Pulse: 66 68  Temp:    Resp: 14 14    Complications: No apparent anesthesia complications

## 2014-12-12 NOTE — Anesthesia Postprocedure Evaluation (Signed)
Anesthesia Post Note  Patient: Aaron Long  Procedure(s) Performed: Procedure(s) (LRB): BRACHIOCEPHALIC ARTERIOVENOUS (AV) FISTULA CREATION (Left)  Patient location during evaluation: PACU Anesthesia Type: MAC Level of consciousness: awake Pain management: pain level controlled Respiratory status: spontaneous breathing Cardiovascular status: blood pressure returned to baseline Anesthetic complications: no    Last Vitals:  Filed Vitals:   12/12/14 1130 12/12/14 1141  BP: 103/57 120/60  Pulse: 65 66  Temp:    Resp: 7 14    Last Pain:  Filed Vitals:   12/12/14 1143  PainSc: 0-No pain                 EDWARDS,Makinzee Durley

## 2014-12-12 NOTE — Progress Notes (Signed)
Blood sugar 364. Pt. Did not take any insulin today or yesterday. Wife states she wasn't home yesterday. Notified Dr.Edwards, new orders received.

## 2014-12-12 NOTE — Interval H&P Note (Signed)
History and Physical Interval Note:  12/12/2014 9:02 AM  Aaron Long  has presented today for surgery, with the diagnosis of End Stage Renal Disease N18.6  The various methods of treatment have been discussed with the patient and family. After consideration of risks, benefits and other options for treatment, the patient has consented to  Procedure(s): BRACHIOCEPHALIC ARTERIOVENOUS (AV) FISTULA CREATION (Left) as a surgical intervention .  [name] history has been reviewed, patient examined, no change in status, stable for surgery.  I have reviewed the patient's chart and labs.  Questions were answered to the patient's satisfaction.     [name]

## 2014-12-12 NOTE — Discharge Instructions (Signed)
° ° °  12/12/2014 Aaron Long [id number] 11-06-1940  Surgeon(s): Mal Misty, MD  Procedure(s): BRACHIOCEPHALIC ARTERIOVENOUS (AV) FISTULA CREATION-LEFT  x Do not stick fistula for 12 weeks

## 2014-12-12 NOTE — Anesthesia Preprocedure Evaluation (Signed)
Anesthesia Evaluation  Patient identified by MRN, date of birth, ID band Patient awake    Reviewed: Allergy & Precautions, NPO status , Patient's Chart, lab work & pertinent test results  Airway Mallampati: II  TM Distance: >3 FB Neck ROM: Full    Dental   Pulmonary former smoker,    breath sounds clear to auscultation       Cardiovascular hypertension, + CAD, + Past MI and + Peripheral Vascular Disease   Rhythm:Regular Rate:Normal     Neuro/Psych    GI/Hepatic Neg liver ROS, GERD  ,  Endo/Other  diabetes  Renal/GU Renal disease     Musculoskeletal   Abdominal   Peds  Hematology   Anesthesia Other Findings   Reproductive/Obstetrics                             Anesthesia Physical Anesthesia Plan  ASA: III  Anesthesia Plan: General   Post-op Pain Management:    Induction: Intravenous  Airway Management Planned: Simple Face Mask  Additional Equipment:   Intra-op Plan:   Post-operative Plan:   Informed Consent: I have reviewed the patients History and Physical, chart, labs and discussed the procedure including the risks, benefits and alternatives for the proposed anesthesia with the patient or authorized representative who has indicated his/her understanding and acceptance.   Dental advisory given  Plan Discussed with: Anesthesiologist and CRNA  Anesthesia Plan Comments:         Anesthesia Quick Evaluation

## 2014-12-12 NOTE — H&P (View-Only) (Signed)
Subjective:     Patient ID: Aaron Long, male   DOB: 12-15-1940, 74 y.o.   MRN: [id number]  HPI this 74 year old male with end-stage renal disease has hemodialysis on Monday Wednesday Friday. He recently had a right radial-cephalic AV fistula ligated because of steal syndrome. The fistula was created in April 2016. Currently he is being dialyzed through a right IJ catheter. He is left-handed. He is seen today for vascular access evaluation. He has bilateral below-knee amputations.  Past Medical History  Diagnosis Date  . PAD (peripheral artery disease) ([place])     a. R CEA 2007. b. Hx L BKA in 2013. c. s/p LE angioplasty in 2014. d. Hx R BKA in 2014.  . Gangrene of toe (HCC)     dry  . Neuromuscular disorder ([place])     diabetic neuropathy  . Hypertension   . Hyperlipidemia   . Peripheral neuropathy ([place])   . Constipation     takes Miralax daily  . Hemorrhoids   . Hx of colonic polyps   . Coronary artery disease     a. s/p NSTEMI/CABGx4 in 2007 - LIMA-LAD, SVG-optional diagonal, SVVG-OM, SVG-dRCA. b. Nuc 05/2011: nonischemic.  [name] BPH (benign prostatic hypertrophy)   . Abnormality of gait   . DVT (deep venous thrombosis) ([place])     a. Lower extremity DVT in 2013.  [name] Hypertrophy of prostate without urinary obstruction and other lower urinary tract symptoms (LUTS)   . Disorder of bone and cartilage, unspecified   . Lower limb amputation, below knee   . Anemia   . Secondary hyperparathyroidism ([place])     Secondary Hyperpara- Thyroidism, Renal  . Ischemic cardiomyopathy     a. EF 40% in 2007. b. 51% by nuc 05/2011.  [name] Complication of anesthesia     "he gets delirious"  . Myocardial infarction ([place]) 2005  . Type II diabetes mellitus ([place])   . Diabetic peripheral neuropathy ([place])   . Prostate cancer ([place]) 2010  . ESRD on dialysis [facility])     M-W-F  . Kidney stones     Social History  Substance Use Topics  . Smoking status: Former Smoker -- 1.00 packs/day for 30 years    Types:  Cigarettes    Quit date: 04/28/1991  . Smokeless tobacco: Never Used  . Alcohol Use: Yes     Comment: 04/17/2014 "might have a glass of wine at Christmas"    Family History  Problem Relation Age of Onset  . Coronary artery disease Neg Hx   . Anesthesia problems Neg Hx   . Hypotension Neg Hx   . Malignant hyperthermia Neg Hx   . Pseudochol deficiency Neg Hx   . Hyperlipidemia Mother   . Hypertension Mother   . Cancer Mother   . Hyperlipidemia Father   . Hypertension Father   . Kidney disease Father   . Heart disease Sister   . Alzheimer's disease Sister   . Cancer Brother   . Other Sister     Allergies  Allergen Reactions  . Oxycodone Other (See Comments)    Hallucinations     Current outpatient prescriptions:  .  AMBULATORY NON FORMULARY MEDICATION, One Touch Ultra 2 Test Strips Sig:  Check blood sugars Three times daily to keep blood sugars under control Dx: E11.9, Disp: 100 strip, Rfl: 11 .  AMBULATORY NON FORMULARY MEDICATION, BD U/P Mini Pen Neddles 31GX5MM Use three times daily as directed DX: E11.9, Disp: 100 each, Rfl: 11 .  amLODipine (NORVASC)  10 MG tablet, Take 1 tablet (10 mg total) by mouth daily., Disp: 30 tablet, Rfl: 2 .  aspirin EC 81 MG tablet, Take 1 tablet (81 mg total) by mouth every morning., Disp: , Rfl:  .  calcium acetate (PHOSLO) 667 MG capsule, Take 1 capsule (667 mg total) by mouth 3 (three) times daily with meals., Disp: 90 capsule, Rfl: 1 .  clopidogrel (PLAVIX) 75 MG tablet, take 1 tablet by mouth once daily, Disp: 90 tablet, Rfl: 1 .  HYDROcodone-acetaminophen (NORCO/VICODIN) 5-325 MG tablet, Take 1 tablet by mouth every 6 (six) hours as needed for moderate pain., Disp: 30 tablet, Rfl: 0 .  insulin aspart (NOVOLOG) 100 UNIT/ML FlexPen, Inject 5 Units into the skin 3 (three) times daily with meals. Inject 5 units three times a day with meals if blood sugar greater than 150 only, DX: 250.71, Disp: , Rfl:  .  Insulin Detemir (LEVEMIR) 100 UNIT/ML  Pen, Inject 15 Units into the skin 2 (two) times daily., Disp: 15 mL, Rfl: 3 .  Misc. Devices [facility]) MISC, Patient needs new Wheelchair due to his being broken., Disp: 1 each, Rfl: 0 .  multivitamin (RENA-VIT) TABS tablet, Take 1 tablet by mouth at bedtime., Disp: 30 tablet, Rfl: 1 .  pravastatin (PRAVACHOL) 20 MG tablet, Take 2 tablets (40 mg total) by mouth daily., Disp: 60 tablet, Rfl: 0 .  pregabalin (LYRICA) 75 MG capsule, Take one tablet by mouth once daily for pains, Disp: 30 capsule, Rfl: 0 .  QUEtiapine (SEROQUEL) 25 MG tablet, Take 1 tablet (25 mg total) by mouth daily., Disp: 30 tablet, Rfl: 1 .  senna-docusate (SENOKOT-S) 8.6-50 MG per tablet, Take 2 tablets by mouth 2 (two) times daily., Disp: 60 tablet, Rfl: 0 .  tamsulosin (FLOMAX) 0.4 MG CAPS capsule, Take 1 capsule (0.4 mg total) by mouth daily., Disp: 30 capsule, Rfl: 3  Filed Vitals:   12/03/14 0849 12/03/14 0852  BP: 173/79 182/76  Pulse: 80   Height: 5\' 8"  (1.727 m)   Weight: 181 lb (82.101 kg)   SpO2: 97%     Body mass index is 27.53 kg/(m^2).           Review of Systems denies chest pain. Does have history of diabetic neuropathy. Has bilateral below-knee amputations. Status post bilateral carotid endarterectomies.     Objective:   Physical Exam BP 182/76 mmHg  Pulse 80  Ht 5\' 8"  (1.727 m)  Wt 181 lb (82.101 kg)  BMI 27.53 kg/m2  SpO2 97%  Gen.-alert and oriented x3 in no apparent distress HEENT normal for age Lungs no rhonchi or wheezing Cardiovascular regular rhythm no murmurs carotid pulses 3+ palpable no bruits audible Abdomen soft nontender no palpable masses Musculoskeletal free of  major deformities Skin clear -no rashes Neurologic normal Lower extremities bilateral below-knee dictations. Left upper extremity with 2+ radial pulse palpable. Cephalic vein appears adequate with sono site examination from the antecubital area to the shoulder. Right hand has reasonably good grip but  patient still complains of numbness when on dialysis.  I reviewed the upper extremity vein mapping which was performed recently in our office and this reveals adequate cephalic vein in the left upper arm but not in the forearm      Assessment:     End-stage renal disease needs vascular access History of steel syndrome right upper extremity with resultant ligation right radial cephalic AV fistula and persistent discomfort and numbness while on dialysis Bilateral below-knee amputations History of diabetic neuropathy  Plan:     Plan left brachial-cephalic AV fistula on Thursday, December 1 Discussed at length with patient and his daughter the fact that he could develop steal syndrome and left arm as well and we will need to watch this closely regarding possible ligation of fistula. If that should occur he will need graft in one of his lower extremities. Patient understands and would like to proceed

## 2014-12-12 NOTE — Progress Notes (Signed)
Dr.Edwards notified of BP. Albumin ordered. Dr.Lawson at bedside and aware.

## 2014-12-12 NOTE — Op Note (Signed)
OPERATIVE REPORT  Date of Surgery: 12/12/2014  Surgeon: [name], MD  Assistant: Leontine Locket PA  Pre-op Diagnosis: End Stage Renal Disease N18.6  Post-op Diagnosis: end stage renal disease.  Procedure: Procedure(s): BRACHIOCEPHALIC ARTERIOVENOUS (AV) FISTULA CREATION-left upper extremity  Anesthesia: Mac   EBL: Minimal  Complications: None  Procedure Details: The patient was taken the operating room placed in supine position at which time the left upper extremity was prepped Betadine scrub and solution draped in routine sterile manner. After infiltration forms and Xylocaine with epinephrine a short transverse incision was made in the antecubital area antecubital vein dissected free. Cephalic branch was excellent in caliber and consistency. It was approximately 3-1/2 mm in size. Brachial artery was exposed in a fashion circumflex with Vesseloops. It was a good vessel with excellent pulse. The cephalic vein was ligated with 2-0 silk tie at its junction with the basilic to preserve the basilic. It was then transected gently dilated with heparinized saline. Brachial arteriy was occluded proximally and distally with vessel loops opened 15 blade extended with Potts scissors. Cephalic vein was anastomosed end-to-side with 6-0 Prolene Vesseloops released and there was a good pulse and thrill in the fistula. I then imaged the vein with the ultrasound-sono site and it appeared that there may be a branch in the mid upper arm and after infiltration forms and Xylocaine with epinephrine short longitudinal incision was made in this area the cephalic vein dissected free proximally and distally. The vein did change caliber proximal to this point but there was no branch to ligate. This wound was closed with Vicryl subcuticular fashion as was the antecubital wound. Following this I was not happy with the flow in the fistula in the proximal upper arm therefore reopened the wound. There was a good pulse in  the cephalic vein adjacent to the anastomosis. We occluded the brachial artery proximally and distally with Vesseloops and made a very small transverse venotomy about a centimeter from the anastomosis. 3 Fogarty catheter would easily pass into the central veins and although there was smaller caliber proximally it was widely patent with good backbleeding. The inflow from the proximal brachial artery did not appear as good as it had been therefore I passed a 4 Fogarty catheter proximally and upon return inflow immediately improved dramatically. The opening in the cephalic vein was reclosed with continuous 6-0 Prolene suture clamps released and now there was excellent Doppler flow throughout the fistula and slight diminution of radial arterial flow distally with the fistula open which improved with compression of the fistula. Adequate hemostasis was achieved and wounds closed in layers with Vicryl subcuticular fashion with Dermabond patient taken to recovery in satisfactory condition   [name], MD 12/12/2014 11:04 AM

## 2014-12-13 ENCOUNTER — Encounter (HOSPITAL_COMMUNITY): Payer: Self-pay | Admitting: Vascular Surgery

## 2014-12-13 DIAGNOSIS — E214 Other specified disorders of parathyroid gland: Secondary | ICD-10-CM | POA: Diagnosis not present

## 2014-12-13 DIAGNOSIS — D509 Iron deficiency anemia, unspecified: Secondary | ICD-10-CM | POA: Diagnosis not present

## 2014-12-13 DIAGNOSIS — E119 Type 2 diabetes mellitus without complications: Secondary | ICD-10-CM | POA: Diagnosis not present

## 2014-12-13 DIAGNOSIS — D631 Anemia in chronic kidney disease: Secondary | ICD-10-CM | POA: Diagnosis not present

## 2014-12-13 DIAGNOSIS — N186 End stage renal disease: Secondary | ICD-10-CM | POA: Diagnosis not present

## 2014-12-16 DIAGNOSIS — N186 End stage renal disease: Secondary | ICD-10-CM | POA: Diagnosis not present

## 2014-12-16 DIAGNOSIS — D509 Iron deficiency anemia, unspecified: Secondary | ICD-10-CM | POA: Diagnosis not present

## 2014-12-16 DIAGNOSIS — D631 Anemia in chronic kidney disease: Secondary | ICD-10-CM | POA: Diagnosis not present

## 2014-12-16 DIAGNOSIS — E119 Type 2 diabetes mellitus without complications: Secondary | ICD-10-CM | POA: Diagnosis not present

## 2014-12-16 DIAGNOSIS — E214 Other specified disorders of parathyroid gland: Secondary | ICD-10-CM | POA: Diagnosis not present

## 2014-12-18 ENCOUNTER — Telehealth: Payer: Self-pay | Admitting: Vascular Surgery

## 2014-12-18 DIAGNOSIS — D509 Iron deficiency anemia, unspecified: Secondary | ICD-10-CM | POA: Diagnosis not present

## 2014-12-18 DIAGNOSIS — D631 Anemia in chronic kidney disease: Secondary | ICD-10-CM | POA: Diagnosis not present

## 2014-12-18 DIAGNOSIS — N186 End stage renal disease: Secondary | ICD-10-CM | POA: Diagnosis not present

## 2014-12-18 DIAGNOSIS — E119 Type 2 diabetes mellitus without complications: Secondary | ICD-10-CM | POA: Diagnosis not present

## 2014-12-18 DIAGNOSIS — E214 Other specified disorders of parathyroid gland: Secondary | ICD-10-CM | POA: Diagnosis not present

## 2014-12-18 NOTE — Telephone Encounter (Signed)
-----   Message from Mena Goes, RN sent at 12/12/2014 11:09 AM EST ----- Regarding: schedule   ----- Message -----    From: Gabriel Earing, PA-C    Sent: 12/12/2014  10:59 AM      To: [facility] Charge Pool  S/p left BC AVF 12/12/14.  F/u with Dr. Kellie Simmering in 6 weeks with duplex.  Thanks, Aldona Bar

## 2014-12-18 NOTE — Telephone Encounter (Signed)
LM for pt re appt, dpm °

## 2014-12-20 DIAGNOSIS — E214 Other specified disorders of parathyroid gland: Secondary | ICD-10-CM | POA: Diagnosis not present

## 2014-12-20 DIAGNOSIS — D631 Anemia in chronic kidney disease: Secondary | ICD-10-CM | POA: Diagnosis not present

## 2014-12-20 DIAGNOSIS — D509 Iron deficiency anemia, unspecified: Secondary | ICD-10-CM | POA: Diagnosis not present

## 2014-12-20 DIAGNOSIS — N186 End stage renal disease: Secondary | ICD-10-CM | POA: Diagnosis not present

## 2014-12-20 DIAGNOSIS — E119 Type 2 diabetes mellitus without complications: Secondary | ICD-10-CM | POA: Diagnosis not present

## 2014-12-23 DIAGNOSIS — N186 End stage renal disease: Secondary | ICD-10-CM | POA: Diagnosis not present

## 2014-12-23 DIAGNOSIS — D631 Anemia in chronic kidney disease: Secondary | ICD-10-CM | POA: Diagnosis not present

## 2014-12-23 DIAGNOSIS — D509 Iron deficiency anemia, unspecified: Secondary | ICD-10-CM | POA: Diagnosis not present

## 2014-12-23 DIAGNOSIS — E214 Other specified disorders of parathyroid gland: Secondary | ICD-10-CM | POA: Diagnosis not present

## 2014-12-23 DIAGNOSIS — E119 Type 2 diabetes mellitus without complications: Secondary | ICD-10-CM | POA: Diagnosis not present

## 2014-12-25 DIAGNOSIS — D509 Iron deficiency anemia, unspecified: Secondary | ICD-10-CM | POA: Diagnosis not present

## 2014-12-25 DIAGNOSIS — E214 Other specified disorders of parathyroid gland: Secondary | ICD-10-CM | POA: Diagnosis not present

## 2014-12-25 DIAGNOSIS — D631 Anemia in chronic kidney disease: Secondary | ICD-10-CM | POA: Diagnosis not present

## 2014-12-25 DIAGNOSIS — E119 Type 2 diabetes mellitus without complications: Secondary | ICD-10-CM | POA: Diagnosis not present

## 2014-12-25 DIAGNOSIS — N186 End stage renal disease: Secondary | ICD-10-CM | POA: Diagnosis not present

## 2014-12-27 DIAGNOSIS — E119 Type 2 diabetes mellitus without complications: Secondary | ICD-10-CM | POA: Diagnosis not present

## 2014-12-27 DIAGNOSIS — N186 End stage renal disease: Secondary | ICD-10-CM | POA: Diagnosis not present

## 2014-12-27 DIAGNOSIS — E214 Other specified disorders of parathyroid gland: Secondary | ICD-10-CM | POA: Diagnosis not present

## 2014-12-27 DIAGNOSIS — D631 Anemia in chronic kidney disease: Secondary | ICD-10-CM | POA: Diagnosis not present

## 2014-12-27 DIAGNOSIS — D509 Iron deficiency anemia, unspecified: Secondary | ICD-10-CM | POA: Diagnosis not present

## 2014-12-30 DIAGNOSIS — E214 Other specified disorders of parathyroid gland: Secondary | ICD-10-CM | POA: Diagnosis not present

## 2014-12-30 DIAGNOSIS — D631 Anemia in chronic kidney disease: Secondary | ICD-10-CM | POA: Diagnosis not present

## 2014-12-30 DIAGNOSIS — D509 Iron deficiency anemia, unspecified: Secondary | ICD-10-CM | POA: Diagnosis not present

## 2014-12-30 DIAGNOSIS — N186 End stage renal disease: Secondary | ICD-10-CM | POA: Diagnosis not present

## 2014-12-30 DIAGNOSIS — E119 Type 2 diabetes mellitus without complications: Secondary | ICD-10-CM | POA: Diagnosis not present

## 2015-01-01 DIAGNOSIS — D631 Anemia in chronic kidney disease: Secondary | ICD-10-CM | POA: Diagnosis not present

## 2015-01-01 DIAGNOSIS — D509 Iron deficiency anemia, unspecified: Secondary | ICD-10-CM | POA: Diagnosis not present

## 2015-01-01 DIAGNOSIS — E214 Other specified disorders of parathyroid gland: Secondary | ICD-10-CM | POA: Diagnosis not present

## 2015-01-01 DIAGNOSIS — N186 End stage renal disease: Secondary | ICD-10-CM | POA: Diagnosis not present

## 2015-01-01 DIAGNOSIS — E119 Type 2 diabetes mellitus without complications: Secondary | ICD-10-CM | POA: Diagnosis not present

## 2015-01-03 DIAGNOSIS — E214 Other specified disorders of parathyroid gland: Secondary | ICD-10-CM | POA: Diagnosis not present

## 2015-01-03 DIAGNOSIS — D509 Iron deficiency anemia, unspecified: Secondary | ICD-10-CM | POA: Diagnosis not present

## 2015-01-03 DIAGNOSIS — D631 Anemia in chronic kidney disease: Secondary | ICD-10-CM | POA: Diagnosis not present

## 2015-01-03 DIAGNOSIS — N186 End stage renal disease: Secondary | ICD-10-CM | POA: Diagnosis not present

## 2015-01-03 DIAGNOSIS — E119 Type 2 diabetes mellitus without complications: Secondary | ICD-10-CM | POA: Diagnosis not present

## 2015-01-06 DIAGNOSIS — D631 Anemia in chronic kidney disease: Secondary | ICD-10-CM | POA: Diagnosis not present

## 2015-01-06 DIAGNOSIS — E119 Type 2 diabetes mellitus without complications: Secondary | ICD-10-CM | POA: Diagnosis not present

## 2015-01-06 DIAGNOSIS — E214 Other specified disorders of parathyroid gland: Secondary | ICD-10-CM | POA: Diagnosis not present

## 2015-01-06 DIAGNOSIS — N186 End stage renal disease: Secondary | ICD-10-CM | POA: Diagnosis not present

## 2015-01-06 DIAGNOSIS — D509 Iron deficiency anemia, unspecified: Secondary | ICD-10-CM | POA: Diagnosis not present

## 2015-01-08 DIAGNOSIS — D631 Anemia in chronic kidney disease: Secondary | ICD-10-CM | POA: Diagnosis not present

## 2015-01-08 DIAGNOSIS — N186 End stage renal disease: Secondary | ICD-10-CM | POA: Diagnosis not present

## 2015-01-08 DIAGNOSIS — E119 Type 2 diabetes mellitus without complications: Secondary | ICD-10-CM | POA: Diagnosis not present

## 2015-01-08 DIAGNOSIS — D509 Iron deficiency anemia, unspecified: Secondary | ICD-10-CM | POA: Diagnosis not present

## 2015-01-08 DIAGNOSIS — E214 Other specified disorders of parathyroid gland: Secondary | ICD-10-CM | POA: Diagnosis not present

## 2015-01-09 ENCOUNTER — Ambulatory Visit (INDEPENDENT_AMBULATORY_CARE_PROVIDER_SITE_OTHER): Payer: Medicare Other | Admitting: Nurse Practitioner

## 2015-01-09 ENCOUNTER — Encounter: Payer: Self-pay | Admitting: Nurse Practitioner

## 2015-01-09 VITALS — BP 138/70 | HR 76 | Temp 98.6°F | Resp 12

## 2015-01-09 DIAGNOSIS — Z89511 Acquired absence of right leg below knee: Secondary | ICD-10-CM | POA: Diagnosis not present

## 2015-01-09 DIAGNOSIS — E1122 Type 2 diabetes mellitus with diabetic chronic kidney disease: Secondary | ICD-10-CM | POA: Diagnosis not present

## 2015-01-09 DIAGNOSIS — R202 Paresthesia of skin: Secondary | ICD-10-CM

## 2015-01-09 DIAGNOSIS — E1165 Type 2 diabetes mellitus with hyperglycemia: Secondary | ICD-10-CM

## 2015-01-09 DIAGNOSIS — R200 Anesthesia of skin: Secondary | ICD-10-CM

## 2015-01-09 DIAGNOSIS — N186 End stage renal disease: Secondary | ICD-10-CM

## 2015-01-09 DIAGNOSIS — Z89512 Acquired absence of left leg below knee: Secondary | ICD-10-CM

## 2015-01-09 DIAGNOSIS — N400 Enlarged prostate without lower urinary tract symptoms: Secondary | ICD-10-CM | POA: Diagnosis not present

## 2015-01-09 DIAGNOSIS — G8918 Other acute postprocedural pain: Secondary | ICD-10-CM | POA: Diagnosis not present

## 2015-01-09 DIAGNOSIS — I2510 Atherosclerotic heart disease of native coronary artery without angina pectoris: Secondary | ICD-10-CM

## 2015-01-09 DIAGNOSIS — Z794 Long term (current) use of insulin: Secondary | ICD-10-CM

## 2015-01-09 DIAGNOSIS — K5909 Other constipation: Secondary | ICD-10-CM

## 2015-01-09 DIAGNOSIS — IMO0002 Reserved for concepts with insufficient information to code with codable children: Secondary | ICD-10-CM

## 2015-01-09 DIAGNOSIS — E785 Hyperlipidemia, unspecified: Secondary | ICD-10-CM | POA: Diagnosis not present

## 2015-01-09 DIAGNOSIS — R52 Pain, unspecified: Secondary | ICD-10-CM

## 2015-01-09 DIAGNOSIS — Z992 Dependence on renal dialysis: Secondary | ICD-10-CM | POA: Diagnosis not present

## 2015-01-09 DIAGNOSIS — G546 Phantom limb syndrome with pain: Secondary | ICD-10-CM

## 2015-01-09 DIAGNOSIS — I10 Essential (primary) hypertension: Secondary | ICD-10-CM

## 2015-01-09 DIAGNOSIS — I6523 Occlusion and stenosis of bilateral carotid arteries: Secondary | ICD-10-CM

## 2015-01-09 MED ORDER — AMLODIPINE BESYLATE 10 MG PO TABS: 10.0000 mg | ORAL_TABLET | Freq: Every day | ORAL | Status: DC

## 2015-01-09 MED ORDER — CLOPIDOGREL BISULFATE 75 MG PO TABS: 75.0000 mg | ORAL_TABLET | Freq: Every day | ORAL | Status: DC

## 2015-01-09 MED ORDER — TAMSULOSIN HCL 0.4 MG PO CAPS: 0.4000 mg | ORAL_CAPSULE | Freq: Every day | ORAL | Status: DC

## 2015-01-09 MED ORDER — PRAVASTATIN SODIUM 40 MG PO TABS: 40.0000 mg | ORAL_TABLET | Freq: Every day | ORAL | Status: DC

## 2015-01-09 MED ORDER — INSULIN DETEMIR 100 UNIT/ML FLEXPEN: 15.0000 [IU] | PEN_INJECTOR | Freq: Two times a day (BID) | SUBCUTANEOUS | Status: DC

## 2015-01-09 MED ORDER — PREGABALIN 75 MG PO CAPS: ORAL_CAPSULE | ORAL | Status: DC

## 2015-01-09 MED ORDER — QUETIAPINE FUMARATE 25 MG PO TABS: 25.0000 mg | ORAL_TABLET | Freq: Every day | ORAL | Status: DC

## 2015-01-09 MED ORDER — SENNOSIDES-DOCUSATE SODIUM 8.6-50 MG PO TABS: 2.0000 | ORAL_TABLET | Freq: Two times a day (BID) | ORAL | Status: DC

## 2015-01-09 MED ORDER — HYDROCODONE-ACETAMINOPHEN 5-325 MG PO TABS: 1.0000 | ORAL_TABLET | Freq: Four times a day (QID) | ORAL | Status: DC | PRN

## 2015-01-09 NOTE — Patient Instructions (Addendum)
Please record fasting blood sugars and bring to follow up visit  Follow up in 6 weeks for diabetes  Will refer to PT for evaluation of mobility device.    Heart-Healthy Eating Plan Many factors influence your heart health, including eating and exercise habits. Heart (coronary) risk increases with abnormal blood fat (lipid) levels. Heart-healthy meal planning includes limiting unhealthy fats, increasing healthy fats, and making other small dietary changes. This includes maintaining a healthy body weight to help keep lipid levels within a normal range. WHAT TYPES OF FAT SHOULD I CHOOSE?  Choose healthy fats more often. Choose monounsaturated and polyunsaturated fats, such as olive oil and canola oil, flaxseeds, walnuts, almonds, and seeds.  Eat more omega-3 fats. Good choices include salmon, mackerel, sardines, tuna, flaxseed oil, and ground flaxseeds. Aim to eat fish at least two times each week.  Limit saturated fats. Saturated fats are primarily found in animal products, such as meats, butter, and cream. Plant sources of saturated fats include palm oil, palm kernel oil, and coconut oil.  Avoid foods with partially hydrogenated oils in them. These contain trans fats. Examples of foods that contain trans fats are stick margarine, some tub margarines, cookies, crackers, and other baked goods. WHAT GENERAL GUIDELINES DO I NEED TO FOLLOW?  Check food labels carefully to identify foods with trans fats or high amounts of saturated fat.  Fill one half of your plate with vegetables and green salads. Eat 4-5 servings of vegetables per day. A serving of vegetables equals 1 cup of raw leafy vegetables,  cup of raw or cooked cut-up vegetables, or  cup of vegetable juice.  Fill one fourth of your plate with whole grains. Look for the word "whole" as the first word in the ingredient list.  Fill one fourth of your plate with lean protein foods.  Eat 4-5 servings of fruit per day. A serving of fruit  equals one medium whole fruit,  cup of dried fruit,  cup of fresh, frozen, or canned fruit, or  cup of 100% fruit juice.  Eat more foods that contain soluble fiber. Examples of foods that contain this type of fiber are apples, broccoli, carrots, beans, peas, and barley. Aim to get 20-30 g of fiber per day.  Eat more home-cooked food and less restaurant, buffet, and fast food.  Limit or avoid alcohol.  Limit foods that are high in starch and sugar.  Avoid fried foods.  Cook foods by using methods other than frying. Baking, boiling, grilling, and broiling are all great options. Other fat-reducing suggestions include:  Removing the skin from poultry.  Removing all visible fats from meats.  Skimming the fat off of stews, soups, and gravies before serving them.  Steaming vegetables in water or broth.  Lose weight if you are overweight. Losing just 5-10% of your initial body weight can help your overall health and prevent diseases such as diabetes and heart disease.  Increase your consumption of nuts, legumes, and seeds to 4-5 servings per week. One serving of dried beans or legumes equals  cup after being cooked, one serving of nuts equals 1 ounces, and one serving of seeds equals  ounce or 1 tablespoon.  You may need to monitor your salt (sodium) intake, especially if you have high blood pressure. Talk with your health care provider or dietitian to get more information about reducing sodium. WHAT FOODS CAN I EAT? Grains Breads, including [place], white, pita, wheat, raisin, rye, oatmeal, and [place]. Tortillas that are neither fried nor made  with lard or trans fat. Low-fat rolls, including hotdog and hamburger buns and English muffins. Biscuits. Muffins. Waffles. Pancakes. Light popcorn. Whole-grain cereals. Flatbread. Melba toast. Pretzels. Breadsticks. Rusks. Low-fat snacks and crackers, including oyster, saltine, matzo, graham, animal, and rye. Rice and pasta, including brown rice  and those that are made with whole wheat. Vegetables All vegetables. Fruits All fruits, but limit coconut. Meats and Other Protein Sources Lean, well-trimmed beef, veal, pork, and lamb. Chicken and [place] without skin. All fish and shellfish. Wild duck, rabbit, pheasant, and venison. Egg whites or low-cholesterol egg substitutes. Dried beans, peas, lentils, and tofu.Seeds and most nuts. Dairy Low-fat or nonfat cheeses, including ricotta, string, and mozzarella. Skim or 1% milk that is liquid, powdered, or evaporated. Buttermilk that is made with low-fat milk. Nonfat or low-fat yogurt. Beverages Mineral water. Diet carbonated beverages. Sweets and Desserts Sherbets and fruit ices. Honey, jam, marmalade, jelly, and syrups. Meringues and gelatins. Pure sugar candy, such as hard candy, jelly beans, gumdrops, mints, marshmallows, and small amounts of dark chocolate. [employer] Eat all sweets and desserts in moderation. Fats and Oils Nonhydrogenated (trans-free) margarines. Vegetable oils, including soybean, sesame, sunflower, olive, peanut, safflower, corn, canola, and cottonseed. Salad dressings or mayonnaise that are made with a vegetable oil. Limit added fats and oils that you use for cooking, baking, salads, and as spreads. Other Cocoa powder. Coffee and tea. All seasonings and condiments. The items listed above may not be a complete list of recommended foods or beverages. Contact your dietitian for more options. WHAT FOODS ARE NOT RECOMMENDED? Grains Breads that are made with saturated or trans fats, oils, or whole milk. Croissants. Butter rolls. Cheese breads. Sweet rolls. Donuts. Buttered popcorn. Chow mein noodles. High-fat crackers, such as cheese or butter crackers. Meats and Other Protein Sources Fatty meats, such as hotdogs, short ribs, sausage, spareribs, bacon, ribeye roast or steak, and mutton. High-fat deli meats, such as salami and bologna. Caviar. Domestic duck and goose.  Organ meats, such as kidney, liver, sweetbreads, brains, gizzard, chitterlings, and heart. Dairy Cream, sour cream, cream cheese, and creamed cottage cheese. Whole milk cheeses, including blue (bleu), Monterey Jack, [place], [place], American, [place], Swiss, [place], [place], and [place]. Whole or 2% milk that is liquid, evaporated, or condensed. Whole buttermilk. Cream sauce or high-fat cheese sauce. Yogurt that is made from whole milk. Beverages Regular sodas and drinks with added sugar. Sweets and Desserts Frosting. Pudding. Cookies. Cakes other than angel food cake. Candy that has milk chocolate or white chocolate, hydrogenated fat, butter, coconut, or unknown ingredients. Buttered syrups. Full-fat ice cream or ice cream drinks. Fats and Oils Gravy that has suet, meat fat, or shortening. Cocoa butter, hydrogenated oils, palm oil, coconut oil, palm kernel oil. These can often be found in baked products, candy, fried foods, nondairy creamers, and whipped toppings. Solid fats and shortenings, including bacon fat, salt pork, lard, and butter. Nondairy cream substitutes, such as coffee creamers and sour cream substitutes. Salad dressings that are made of unknown oils, cheese, or sour cream. The items listed above may not be a complete list of foods and beverages to avoid. Contact your dietitian for more information.   This information is not intended to replace advice given to you by your health care provider. Make sure you discuss any questions you have with your health care provider.   Document Released: 10/07/2007 Document Revised: 01/18/2014 Document Reviewed: 06/21/2013 Elsevier Interactive Patient Education [employer].

## 2015-01-09 NOTE — Progress Notes (Signed)
Patient ID: Aaron Long, male   DOB: [birth year], 74 y.o.   MRN: [id number]    PCP: [name], NP  Advanced Directive information    Allergies  Allergen Reactions  . Oxycodone Other (See Comments)    Hallucinations    Chief Complaint  Patient presents with  . Medical Management of Chronic Issues    5 month follow-up, discuss getting power wheelchair. Patient with decreased mobility of right hand   . Medication Dose Change    Discuss changing pravastatin to 40 mg tab vs 20 md (2 by mouth daily)     HPI: Patient is a 74 y.o. male seen in the office today for routine follow up. Pt with hx of ESRD, bilateral BKA due to PVD, BPH,  Had a right radial-cephalic AV fistula ligated because of steal syndrome. In early December had fistula placed to left arm.  Ongoing follow up with vein and vascular due to steal syndrome. At risk for developing this in left arm as well.  Right arm and hand is very weak. Can not grip or hold onto anything.   Has not been checking blood sugars in the morning. Takes in the evening sometimes, unsure of results. Sometimes forget insulin. Taking 15 units twice daily of levemir.  conts on flomax for BPH, urinating three times daily and has been stable.  Bowels moving without problem on miralax. No constipation.   Review of Systems:  Review of Systems  Constitutional: Negative for diaphoresis.  HENT: Negative for congestion and sore throat.   Respiratory: Negative for cough, shortness of breath and wheezing.   Cardiovascular: Negative for chest pain and leg swelling.  Gastrointestinal: Negative for nausea, vomiting, abdominal pain, diarrhea and constipation.  Genitourinary: Negative for dysuria.  Musculoskeletal: Positive for myalgias (in bilateral stump).       Has phantom pain due bilateral BKA  Skin: Negative for rash.  Neurological: Positive for weakness (to right hand and arm) and numbness (to right hand and arm). Negative for dizziness and  headaches.  Psychiatric/Behavioral: The patient is not nervous/anxious.     Past Medical History  Diagnosis Date  . PAD (peripheral artery disease) ([place])     a. R CEA 2007. b. Hx L BKA in 2013. c. s/p LE angioplasty in 2014. d. Hx R BKA in 2014.  . Gangrene of toe (HCC)     dry  . Neuromuscular disorder ([place])     diabetic neuropathy  . Hypertension   . Hyperlipidemia   . Peripheral neuropathy ([place])   . Constipation     takes Miralax daily  . Hemorrhoids   . Hx of colonic polyps   . Coronary artery disease     a. s/p NSTEMI/CABGx4 in 2007 - LIMA-LAD, SVG-optional diagonal, SVVG-OM, SVG-dRCA. b. Nuc 05/2011: nonischemic.  [name] BPH (benign prostatic hypertrophy)   . Abnormality of gait   . DVT (deep venous thrombosis) ([place])     a. Lower extremity DVT in 2013.  [name] Hypertrophy of prostate without urinary obstruction and other lower urinary tract symptoms (LUTS)   . Disorder of bone and cartilage, unspecified   . Lower limb amputation, below knee   . Anemia   . Secondary hyperparathyroidism ([place])     Secondary Hyperpara- Thyroidism, Renal  . Ischemic cardiomyopathy     a. EF 40% in 2007. b. 51% by nuc 05/2011.  [name] Complication of anesthesia     "he gets delirious"  . Myocardial infarction ([place]) 2005  . Type II  diabetes mellitus ([place])   . Diabetic peripheral neuropathy ([place])   . Prostate cancer ([place]) 2010  . ESRD on dialysis [facility])     M-W-F  . Kidney stones    Past Surgical History  Procedure Laterality Date  . Prostatectomy  2009  . Knee cartilage surgery Left 1964  . Coronary artery bypass graft  2005    CABG X4  . Carotid endarterectomy Right 2005  . Cataract extraction w/ intraocular lens  implant, bilateral Bilateral 2011  . Colonoscopy    . Cardiac catheterization  05/28/11  . Amputation  06/14/2011    Procedure: AMPUTATION DIGIT;  Surgeon: Elam Dutch, MD;  Location: [facility] OR;  Service: Vascular;  Laterality: Left;  Amputation Left fifth toe  . Amputation  06/16/2011     Procedure: AMPUTATION BELOW KNEE;  Surgeon: Elam Dutch, MD;  Location: [place];  Service: Vascular;  Laterality: Left;  . I&d extremity  01/31/2012    Procedure: IRRIGATION AND DEBRIDEMENT EXTREMITY;  Surgeon: Elam Dutch, MD;  Location: [place];  Service: Vascular;  Laterality: Left;  I & D Left BKA   . Amputation Right 06/12/2012    Procedure: AMPUTATION DIGIT;  Surgeon: Elam Dutch, MD;  Location[facility] OR;  Service: Vascular;  Laterality: Right;  GREAT TOE  . Amputation Right 08/08/2012    Procedure: AMPUTATION BELOW KNEE;  Surgeon: Elam Dutch, MD;  Location: [facility] OR;  Service: Vascular;  Laterality: Right;  . Abdominal aortagram N/A 05/28/2011    Procedure: ABDOMINAL Maxcine Ham;  Surgeon: Elam Dutch, MD;  [facility] C[facility] LAB;  Service: Cardiovascular;  Laterality: N/A;  . Abdominal aortagram N/A 06/02/2012    Procedure: ABDOMINAL Maxcine Ham;  Surgeon: Elam Dutch, MD;  Location: [facility] CATH LAB;  Service: Cardiovascular;  Laterality: N/A;  . Abdominal aortagram N/A 06/09/2012    Procedure: ABDOMINAL Maxcine Ham;  Surgeon: Elam Dutch, MD;  Location: [facility] CATH LAB;  Service: Cardiovascular;  Laterality: N/A;  . Insertion of dialysis catheter Right 04/19/2014    Procedure: INSERTION OF DIALYSIS CATHETER-INTERNAL JUGULAR;  Surgeon: Mal Misty, MD;  Location: [place];  Service: Vascular;  Laterality: Right;  . Av fistula placement Right 04/19/2014    Procedure: RIGHT ARM ARTERIOVENOUS (AV) FISTULA CREATION;  Surgeon: Mal Misty, MD;  Location: [place];  Service: Vascular;  Laterality: Right;  . Ligation of competing branches of arteriovenous fistula Right 06/19/2014    Procedure: Right Arm LIGATION OF COMPETING BRANCHES OF RADIOCEPHALIC ARTERIOVENOUS FISTULA;  Surgeon: Mal Misty, MD;  Location: [place];  Service: Vascular;  Laterality: Right;  . Cardiac catheterization N/A 10/04/2014    Procedure: Left Heart Cath and Coronary Angiography;  Surgeon: Sherren Mocha, MD;  Location: [place] CV LAB;  Service: Cardiovascular;  Laterality: N/A;  . Endarterectomy Left 10/17/2014    Procedure: ENDARTERECTOMY CAROTID;  Surgeon: Mal Misty, MD;  Location: [place];  Service: Vascular;  Laterality: Left;  . Ligation of arteriovenous  fistula Right 11/06/2014    Procedure: LIGATION OF RADIOCEPHALIC ARTERIOVENOUS FISTULA;  Surgeon: Mal Misty, MD;  Location: [place];  Service: Vascular;  Laterality: Right;  . Insertion of dialysis catheter N/A 11/06/2014    Procedure: INSERTION OF 23cm DIALYSIS CATHETER - right internal jugular;  Surgeon: Mal Misty, MD;  Location: [place];  Service: Vascular;  Laterality: N/A;  . Av fistula placement Left 12/12/2014    Procedure: BRACHIOCEPHALIC ARTERIOVENOUS (AV) FISTULA CREATION;  Surgeon: Mal Misty, MD;  Location: [facility] OR;  Service: Vascular;  Laterality: Left;   Social History:   reports that he quit smoking about 23 years ago. His smoking use included Cigarettes. He has a 30 pack-year smoking history. He has never used smokeless tobacco. He reports that he drinks alcohol. He reports that he uses illicit drugs (Marijuana).  Family History  Problem Relation Age of Onset  . Coronary artery disease Neg Hx   . Anesthesia problems Neg Hx   . Hypotension Neg Hx   . Malignant hyperthermia Neg Hx   . Pseudochol deficiency Neg Hx   . Hyperlipidemia Mother   . Hypertension Mother   . Cancer Mother   . Hyperlipidemia Father   . Hypertension Father   . Kidney disease Father   . Heart disease Sister   . Alzheimer's disease Sister   . Cancer Brother   . Other Sister     Medications: Patient's Medications  New Prescriptions   No medications on file  Previous Medications   AMBULATORY NON FORMULARY MEDICATION    One Touch Ultra 2 Test Strips Sig:  Check blood sugars Three times daily to keep blood sugars under control Dx: E11.9   AMBULATORY NON FORMULARY MEDICATION    BD U/P Mini Pen [place] 31GX5MM Use three times daily as  directed DX: E11.9   AMLODIPINE (NORVASC) 10 MG TABLET    Take 1 tablet (10 mg total) by mouth daily.   ASPIRIN EC 81 MG TABLET    Take 1 tablet (81 mg total) by mouth every morning.   CALCIUM ACETATE (PHOSLO) 667 MG CAPSULE    Take 1 capsule (667 mg total) by mouth 3 (three) times daily with meals.   CLOPIDOGREL (PLAVIX) 75 MG TABLET    take 1 tablet by mouth once daily   HYDROCODONE-ACETAMINOPHEN (NORCO/VICODIN) 5-325 MG TABLET    Take 1 tablet by mouth every 6 (six) hours as needed for moderate pain.   INSULIN ASPART (NOVOLOG) 100 UNIT/ML FLEXPEN    Inject 5 Units into the skin 3 (three) times daily with meals. Inject 5 units three times a day with meals if blood sugar greater than 150 only, DX: 250.71   INSULIN DETEMIR (LEVEMIR) 100 UNIT/ML PEN    Inject 15 Units into the skin 2 (two) times daily.   PRAVASTATIN (PRAVACHOL) 20 MG TABLET    Take 2 tablets (40 mg total) by mouth daily.   PREGABALIN (LYRICA) 75 MG CAPSULE    Take one tablet by mouth once daily for pains   QUETIAPINE (SEROQUEL) 25 MG TABLET    Take 1 tablet (25 mg total) by mouth daily.   SENNA-DOCUSATE (SENOKOT-S) 8.6-50 MG PER TABLET    Take 2 tablets by mouth 2 (two) times daily.   TAMSULOSIN (FLOMAX) 0.4 MG CAPS CAPSULE    Take 1 capsule (0.4 mg total) by mouth daily.  Modified Medications   No medications on file  Discontinued Medications   MISC. DEVICES [facility]) MISC    Patient needs new Wheelchair due to his being broken.     Physical Exam:  Filed Vitals:   01/09/15 1309  BP: 138/70  Pulse: 76  Temp: 98.6 F (37 C)  TempSrc: Oral  Resp: 12   There is no weight on file to calculate BMI.  Physical Exam  Constitutional: He is oriented to person, place, and time. He appears well-developed and well-nourished.  HENT:  Head: Normocephalic and atraumatic.  Neck: Normal range of motion. Neck supple.  Cardiovascular: Normal rate, regular  rhythm and normal heart sounds.   Pulmonary/Chest: Effort normal and  breath sounds normal. No respiratory distress.  Fistula to left upper arm  Abdominal: Soft. Bowel sounds are normal.  Musculoskeletal: He exhibits tenderness. He exhibits no edema.  bilateral BKA Decrease ROM to right arm, decrease ROM to right and and decrease strength to right hand, unable to grip hand  Neurological: He is alert and oriented to person, place, and time.  Skin: Skin is warm.  Psychiatric: He has a normal mood and affect.    Labs reviewed: Basic Metabolic Panel:  Recent Labs  09/13/14 0915  10/02/14 0730 10/04/14 1345 10/15/14 1345  10/17/14 1600 10/18/14 0427 11/06/14 0805 12/12/14 0807  NA 135  < > 138 134* 139  < >  --  133* 140 134*  K 4.6  < > 4.2 4.0 4.7  < >  --  5.1 4.1 4.4  CL 99*  < > 101 98* 103  --   --  94*  --   --   CO2 26  < > 26 26 25   --   --  26  --   --   GLUCOSE 144*  < > 108* 123* 243*  < >  --  292* 212* 364*  BUN 31*  < > 57* 38* 42*  --   --  45*  --   --   CREATININE 5.64*  < > 7.41* 6.16* 5.91*  --  5.68* 6.14*  --   --   CALCIUM 9.0  < > 9.5 9.0 9.7  --   --  9.5  --   --   PHOS 5.1*  --  6.5* 5.8*  --   --   --   --   --   --   < > = values in this interval not displayed. Liver Function Tests:  Recent Labs  09/08/14 2035  09/30/14 2054 10/02/14 0730 10/04/14 1345 10/15/14 1345  AST 16  --  16  --   --  16  ALT 9*  --  8*  --   --  11*  ALKPHOS 108  --  70  --   --  114  BILITOT 0.5  --  0.6  --   --  0.4  PROT 5.9*  --  6.7  --   --  7.4  ALBUMIN 2.9*  < > 3.2* 2.9* 3.0* 3.5  < > = values in this interval not displayed.  Recent Labs  04/17/14 0700  LIPASE 50   No results for input(s): AMMONIA in the last 8760 hours. CBC:  Recent Labs  09/08/14 2035  09/09/14 1420  09/30/14 2054  10/15/14 1345  10/17/14 1600 10/18/14 0427 11/06/14 0805 12/12/14 0807  WBC 10.2  < > 9.0  < > 5.6  < > 8.6  --  15.2* 11.0*  --   --   NEUTROABS 7.6  --  6.7  --  2.5  --   --   --   --   --   --   --   HGB 10.3*  < > 10.1*  <  > 11.1*  < > 12.3*  < > 12.0* 11.0* 10.9* 12.9*  HCT 31.3*  < > 31.5*  < > 35.1*  < > 39.2  < > 38.1* 34.3* 32.0* 38.0*  MCV 91.8  < > 92.9  < > 98.6  < > 99.5  --  99.5 100.0  --   --  PLT 168  < > 185  < > 207  < > 278  --  238 236  --   --   < > = values in this interval not displayed. Lipid Panel:  Recent Labs  04/02/14 1540 10/01/14 0148  CHOL 147 178  HDL 36* 29*  LDLCALC 97 117*  TRIG 68 158*  CHOLHDL 4.1 6.1   TSH: No results for input(s): TSH in the last 8760 hours. A1C: Lab Results  Component Value Date   HGBA1C 9.8* 10/15/2014     Assessment/Plan 1. Numbness and tingling in right hand -ongoing, conts on lyrica, will get OT involved to evaluate for power chair.  - Ambulatory referral to Occupational Therapy  2. Status post bilateral below knee amputation ([place]) -needing power wheelchair due to bilateral BKA amputation and now with extreme weakness to right hand and loss of sensation. He is unable to propel in manual wheelchair and this limits his ability to preform ADLs - Ambulatory referral to Occupational Therapy  3. ESRD on dialysis ([place]) -conts on dialysis M, W, F  4. Uncontrolled type 2 diabetes mellitus with chronic kidney disease on chronic dialysis, with long-term current use of insulin (HCC) -does not have blood sugars for review but reports noncompliance of insulin at time, encouraged compliance with diet and medication - will have pt record CBGs and follow up in 6 weeks -A1c  - Insulin Detemir (LEVEMIR) 100 UNIT/ML Pen; Inject 15 Units into the skin 2 (two) times daily.  Dispense: 15 mL; Refill: 5  5. Other constipation Well controlled at this time. To cont current regimen   6. Coronary artery disease involving native coronary artery of native heart without angina pectoris Without chest pains, conts on plavix - clopidogrel (PLAVIX) 75 MG tablet; Take 1 tablet (75 mg total) by mouth daily.  Dispense: 90 tablet; Refill: 0  7. Essential  hypertension Blood pressure stable, will cont norvasc at this time.  - amLODipine (NORVASC) 10 MG tablet; Take 1 tablet (10 mg total) by mouth daily.  Dispense: 90 tablet; Refill: 0  8. Phantom pain (HCC) -stable, controlled on lyrica 75 mg daily, will cont  - pregabalin (LYRICA) 75 MG capsule; Take one tablet by mouth once daily for pains  Dispense: 90 capsule; Refill: 0  9. BPH (benign prostatic hyperplasia) Stable on flomax, to cont at this time.  - tamsulosin (FLOMAX) 0.4 MG CAPS capsule; Take 1 capsule (0.4 mg total) by mouth daily.  Dispense: 90 capsule; Refill: 0  10. Hyperlipidemia -cont medication, heart healthy diet  - pravastatin (PRAVACHOL) 40 MG tablet; Take 1 tablet (40 mg total) by mouth daily.  Dispense: 90 tablet; Refill: 0  Milli Woolridge K. Harle Battiest  [facility] [phone] 8 am - 5 pm) [phone] (after hours)

## 2015-01-10 ENCOUNTER — Telehealth: Payer: Self-pay

## 2015-01-10 DIAGNOSIS — N186 End stage renal disease: Secondary | ICD-10-CM | POA: Diagnosis not present

## 2015-01-10 DIAGNOSIS — E119 Type 2 diabetes mellitus without complications: Secondary | ICD-10-CM | POA: Diagnosis not present

## 2015-01-10 DIAGNOSIS — D631 Anemia in chronic kidney disease: Secondary | ICD-10-CM | POA: Diagnosis not present

## 2015-01-10 DIAGNOSIS — E1122 Type 2 diabetes mellitus with diabetic chronic kidney disease: Secondary | ICD-10-CM

## 2015-01-10 DIAGNOSIS — Z794 Long term (current) use of insulin: Principal | ICD-10-CM

## 2015-01-10 DIAGNOSIS — D509 Iron deficiency anemia, unspecified: Secondary | ICD-10-CM | POA: Diagnosis not present

## 2015-01-10 DIAGNOSIS — E1165 Type 2 diabetes mellitus with hyperglycemia: Principal | ICD-10-CM

## 2015-01-10 DIAGNOSIS — Z992 Dependence on renal dialysis: Principal | ICD-10-CM

## 2015-01-10 DIAGNOSIS — E214 Other specified disorders of parathyroid gland: Secondary | ICD-10-CM | POA: Diagnosis not present

## 2015-01-10 DIAGNOSIS — IMO0002 Reserved for concepts with insufficient information to code with codable children: Secondary | ICD-10-CM

## 2015-01-10 LAB — HEMOGLOBIN A1C
Est. average glucose Bld gHb Est-mCnc: 301 mg/dL
Hgb A1c MFr Bld: 12.1 % — ABNORMAL HIGH (ref 4.8–5.6)

## 2015-01-10 NOTE — Telephone Encounter (Signed)
-----   Message from [name], NP sent at 01/10/2015  9:34 AM EST ----- A1c is very elevated! Needs to make OV to see [facility] ASAP, in the meantime to increase levemir to 17 units twice daily and take blood sugar twice daily (one fasting reading and one prior to dinner or bedtime) and record. To bring blood sugar readings to office visit.  Important to maintain diabetic diet as well.

## 2015-01-10 NOTE — Telephone Encounter (Signed)
Discussed with patient's wife, Mrs.Kisiel verbalized understanding of results. Appointment scheduled with Oretha Ellis on 02/03/15. Copy of labs mailed.

## 2015-01-11 DIAGNOSIS — N186 End stage renal disease: Secondary | ICD-10-CM | POA: Diagnosis not present

## 2015-01-11 DIAGNOSIS — E1122 Type 2 diabetes mellitus with diabetic chronic kidney disease: Secondary | ICD-10-CM | POA: Diagnosis not present

## 2015-01-11 DIAGNOSIS — Z992 Dependence on renal dialysis: Secondary | ICD-10-CM | POA: Diagnosis not present

## 2015-01-13 DIAGNOSIS — N2581 Secondary hyperparathyroidism of renal origin: Secondary | ICD-10-CM | POA: Diagnosis not present

## 2015-01-13 DIAGNOSIS — D509 Iron deficiency anemia, unspecified: Secondary | ICD-10-CM | POA: Diagnosis not present

## 2015-01-13 DIAGNOSIS — E214 Other specified disorders of parathyroid gland: Secondary | ICD-10-CM | POA: Diagnosis not present

## 2015-01-13 DIAGNOSIS — N186 End stage renal disease: Secondary | ICD-10-CM | POA: Diagnosis not present

## 2015-01-13 DIAGNOSIS — E119 Type 2 diabetes mellitus without complications: Secondary | ICD-10-CM | POA: Diagnosis not present

## 2015-01-15 DIAGNOSIS — D509 Iron deficiency anemia, unspecified: Secondary | ICD-10-CM | POA: Diagnosis not present

## 2015-01-15 DIAGNOSIS — N186 End stage renal disease: Secondary | ICD-10-CM | POA: Diagnosis not present

## 2015-01-15 DIAGNOSIS — N2581 Secondary hyperparathyroidism of renal origin: Secondary | ICD-10-CM | POA: Diagnosis not present

## 2015-01-15 DIAGNOSIS — E214 Other specified disorders of parathyroid gland: Secondary | ICD-10-CM | POA: Diagnosis not present

## 2015-01-15 DIAGNOSIS — E119 Type 2 diabetes mellitus without complications: Secondary | ICD-10-CM | POA: Diagnosis not present

## 2015-01-17 DIAGNOSIS — N186 End stage renal disease: Secondary | ICD-10-CM | POA: Diagnosis not present

## 2015-01-17 DIAGNOSIS — E119 Type 2 diabetes mellitus without complications: Secondary | ICD-10-CM | POA: Diagnosis not present

## 2015-01-17 DIAGNOSIS — D509 Iron deficiency anemia, unspecified: Secondary | ICD-10-CM | POA: Diagnosis not present

## 2015-01-17 DIAGNOSIS — E214 Other specified disorders of parathyroid gland: Secondary | ICD-10-CM | POA: Diagnosis not present

## 2015-01-17 DIAGNOSIS — N2581 Secondary hyperparathyroidism of renal origin: Secondary | ICD-10-CM | POA: Diagnosis not present

## 2015-01-20 DIAGNOSIS — N2581 Secondary hyperparathyroidism of renal origin: Secondary | ICD-10-CM | POA: Diagnosis not present

## 2015-01-20 DIAGNOSIS — E119 Type 2 diabetes mellitus without complications: Secondary | ICD-10-CM | POA: Diagnosis not present

## 2015-01-20 DIAGNOSIS — N186 End stage renal disease: Secondary | ICD-10-CM | POA: Diagnosis not present

## 2015-01-20 DIAGNOSIS — E214 Other specified disorders of parathyroid gland: Secondary | ICD-10-CM | POA: Diagnosis not present

## 2015-01-20 DIAGNOSIS — D509 Iron deficiency anemia, unspecified: Secondary | ICD-10-CM | POA: Diagnosis not present

## 2015-01-22 DIAGNOSIS — E214 Other specified disorders of parathyroid gland: Secondary | ICD-10-CM | POA: Diagnosis not present

## 2015-01-22 DIAGNOSIS — N186 End stage renal disease: Secondary | ICD-10-CM | POA: Diagnosis not present

## 2015-01-22 DIAGNOSIS — E119 Type 2 diabetes mellitus without complications: Secondary | ICD-10-CM | POA: Diagnosis not present

## 2015-01-22 DIAGNOSIS — N2581 Secondary hyperparathyroidism of renal origin: Secondary | ICD-10-CM | POA: Diagnosis not present

## 2015-01-22 DIAGNOSIS — D509 Iron deficiency anemia, unspecified: Secondary | ICD-10-CM | POA: Diagnosis not present

## 2015-01-24 ENCOUNTER — Encounter: Payer: Self-pay | Admitting: Vascular Surgery

## 2015-01-24 DIAGNOSIS — E214 Other specified disorders of parathyroid gland: Secondary | ICD-10-CM | POA: Diagnosis not present

## 2015-01-24 DIAGNOSIS — E119 Type 2 diabetes mellitus without complications: Secondary | ICD-10-CM | POA: Diagnosis not present

## 2015-01-24 DIAGNOSIS — D509 Iron deficiency anemia, unspecified: Secondary | ICD-10-CM | POA: Diagnosis not present

## 2015-01-24 DIAGNOSIS — N186 End stage renal disease: Secondary | ICD-10-CM | POA: Diagnosis not present

## 2015-01-24 DIAGNOSIS — N2581 Secondary hyperparathyroidism of renal origin: Secondary | ICD-10-CM | POA: Diagnosis not present

## 2015-01-27 DIAGNOSIS — N2581 Secondary hyperparathyroidism of renal origin: Secondary | ICD-10-CM | POA: Diagnosis not present

## 2015-01-27 DIAGNOSIS — E214 Other specified disorders of parathyroid gland: Secondary | ICD-10-CM | POA: Diagnosis not present

## 2015-01-27 DIAGNOSIS — N186 End stage renal disease: Secondary | ICD-10-CM | POA: Diagnosis not present

## 2015-01-27 DIAGNOSIS — D509 Iron deficiency anemia, unspecified: Secondary | ICD-10-CM | POA: Diagnosis not present

## 2015-01-27 DIAGNOSIS — E119 Type 2 diabetes mellitus without complications: Secondary | ICD-10-CM | POA: Diagnosis not present

## 2015-01-28 ENCOUNTER — Encounter: Payer: Medicare Other | Admitting: Vascular Surgery

## 2015-01-28 ENCOUNTER — Encounter (HOSPITAL_COMMUNITY): Payer: Medicare Other

## 2015-01-29 DIAGNOSIS — N186 End stage renal disease: Secondary | ICD-10-CM | POA: Diagnosis not present

## 2015-01-29 DIAGNOSIS — D509 Iron deficiency anemia, unspecified: Secondary | ICD-10-CM | POA: Diagnosis not present

## 2015-01-29 DIAGNOSIS — E119 Type 2 diabetes mellitus without complications: Secondary | ICD-10-CM | POA: Diagnosis not present

## 2015-01-29 DIAGNOSIS — N2581 Secondary hyperparathyroidism of renal origin: Secondary | ICD-10-CM | POA: Diagnosis not present

## 2015-01-29 DIAGNOSIS — E214 Other specified disorders of parathyroid gland: Secondary | ICD-10-CM | POA: Diagnosis not present

## 2015-01-31 DIAGNOSIS — E214 Other specified disorders of parathyroid gland: Secondary | ICD-10-CM | POA: Diagnosis not present

## 2015-01-31 DIAGNOSIS — N186 End stage renal disease: Secondary | ICD-10-CM | POA: Diagnosis not present

## 2015-01-31 DIAGNOSIS — N2581 Secondary hyperparathyroidism of renal origin: Secondary | ICD-10-CM | POA: Diagnosis not present

## 2015-01-31 DIAGNOSIS — E119 Type 2 diabetes mellitus without complications: Secondary | ICD-10-CM | POA: Diagnosis not present

## 2015-01-31 DIAGNOSIS — D509 Iron deficiency anemia, unspecified: Secondary | ICD-10-CM | POA: Diagnosis not present

## 2015-02-03 ENCOUNTER — Ambulatory Visit: Payer: Self-pay | Admitting: Pharmacotherapy

## 2015-02-03 ENCOUNTER — Encounter: Payer: Self-pay | Admitting: Nurse Practitioner

## 2015-02-03 DIAGNOSIS — N186 End stage renal disease: Secondary | ICD-10-CM | POA: Diagnosis not present

## 2015-02-03 DIAGNOSIS — D509 Iron deficiency anemia, unspecified: Secondary | ICD-10-CM | POA: Diagnosis not present

## 2015-02-03 DIAGNOSIS — N2581 Secondary hyperparathyroidism of renal origin: Secondary | ICD-10-CM | POA: Diagnosis not present

## 2015-02-03 DIAGNOSIS — E119 Type 2 diabetes mellitus without complications: Secondary | ICD-10-CM | POA: Diagnosis not present

## 2015-02-03 DIAGNOSIS — E214 Other specified disorders of parathyroid gland: Secondary | ICD-10-CM | POA: Diagnosis not present

## 2015-02-05 ENCOUNTER — Other Ambulatory Visit: Payer: Self-pay | Admitting: Nurse Practitioner

## 2015-02-05 DIAGNOSIS — E119 Type 2 diabetes mellitus without complications: Secondary | ICD-10-CM | POA: Diagnosis not present

## 2015-02-05 DIAGNOSIS — D509 Iron deficiency anemia, unspecified: Secondary | ICD-10-CM | POA: Diagnosis not present

## 2015-02-05 DIAGNOSIS — N2581 Secondary hyperparathyroidism of renal origin: Secondary | ICD-10-CM | POA: Diagnosis not present

## 2015-02-05 DIAGNOSIS — E214 Other specified disorders of parathyroid gland: Secondary | ICD-10-CM | POA: Diagnosis not present

## 2015-02-05 DIAGNOSIS — Z89512 Acquired absence of left leg below knee: Principal | ICD-10-CM

## 2015-02-05 DIAGNOSIS — N186 End stage renal disease: Secondary | ICD-10-CM | POA: Diagnosis not present

## 2015-02-05 DIAGNOSIS — Z89511 Acquired absence of right leg below knee: Secondary | ICD-10-CM

## 2015-02-06 ENCOUNTER — Telehealth: Payer: Self-pay

## 2015-02-06 NOTE — Telephone Encounter (Signed)
Patient's wife called to ask when prescription for hydrocodone would be ready to pick up.   Patient's wife was notified that prescription would be ready to pick up on Friday 02-07-2015 after 9am.

## 2015-02-07 ENCOUNTER — Other Ambulatory Visit: Payer: Self-pay | Admitting: *Deleted

## 2015-02-07 DIAGNOSIS — D509 Iron deficiency anemia, unspecified: Secondary | ICD-10-CM | POA: Diagnosis not present

## 2015-02-07 DIAGNOSIS — E119 Type 2 diabetes mellitus without complications: Secondary | ICD-10-CM | POA: Diagnosis not present

## 2015-02-07 DIAGNOSIS — N2581 Secondary hyperparathyroidism of renal origin: Secondary | ICD-10-CM | POA: Diagnosis not present

## 2015-02-07 DIAGNOSIS — E214 Other specified disorders of parathyroid gland: Secondary | ICD-10-CM | POA: Diagnosis not present

## 2015-02-07 DIAGNOSIS — G8918 Other acute postprocedural pain: Secondary | ICD-10-CM

## 2015-02-07 DIAGNOSIS — N186 End stage renal disease: Secondary | ICD-10-CM | POA: Diagnosis not present

## 2015-02-07 MED ORDER — HYDROCODONE-ACETAMINOPHEN 5-325 MG PO TABS: 1.0000 | ORAL_TABLET | Freq: Four times a day (QID) | ORAL | Status: DC | PRN

## 2015-02-07 NOTE — Telephone Encounter (Signed)
Patient requested and will pick up 

## 2015-02-10 DIAGNOSIS — D509 Iron deficiency anemia, unspecified: Secondary | ICD-10-CM | POA: Diagnosis not present

## 2015-02-10 DIAGNOSIS — E214 Other specified disorders of parathyroid gland: Secondary | ICD-10-CM | POA: Diagnosis not present

## 2015-02-10 DIAGNOSIS — N186 End stage renal disease: Secondary | ICD-10-CM | POA: Diagnosis not present

## 2015-02-10 DIAGNOSIS — N2581 Secondary hyperparathyroidism of renal origin: Secondary | ICD-10-CM | POA: Diagnosis not present

## 2015-02-10 DIAGNOSIS — E119 Type 2 diabetes mellitus without complications: Secondary | ICD-10-CM | POA: Diagnosis not present

## 2015-02-11 DIAGNOSIS — Z992 Dependence on renal dialysis: Secondary | ICD-10-CM | POA: Diagnosis not present

## 2015-02-11 DIAGNOSIS — E1122 Type 2 diabetes mellitus with diabetic chronic kidney disease: Secondary | ICD-10-CM | POA: Diagnosis not present

## 2015-02-11 DIAGNOSIS — N186 End stage renal disease: Secondary | ICD-10-CM | POA: Diagnosis not present

## 2015-02-12 DIAGNOSIS — N186 End stage renal disease: Secondary | ICD-10-CM | POA: Diagnosis not present

## 2015-02-12 DIAGNOSIS — E214 Other specified disorders of parathyroid gland: Secondary | ICD-10-CM | POA: Diagnosis not present

## 2015-02-12 DIAGNOSIS — N2581 Secondary hyperparathyroidism of renal origin: Secondary | ICD-10-CM | POA: Diagnosis not present

## 2015-02-12 DIAGNOSIS — E119 Type 2 diabetes mellitus without complications: Secondary | ICD-10-CM | POA: Diagnosis not present

## 2015-02-14 DIAGNOSIS — E214 Other specified disorders of parathyroid gland: Secondary | ICD-10-CM | POA: Diagnosis not present

## 2015-02-14 DIAGNOSIS — N2581 Secondary hyperparathyroidism of renal origin: Secondary | ICD-10-CM | POA: Diagnosis not present

## 2015-02-14 DIAGNOSIS — E119 Type 2 diabetes mellitus without complications: Secondary | ICD-10-CM | POA: Diagnosis not present

## 2015-02-14 DIAGNOSIS — N186 End stage renal disease: Secondary | ICD-10-CM | POA: Diagnosis not present

## 2015-02-17 DIAGNOSIS — N186 End stage renal disease: Secondary | ICD-10-CM | POA: Diagnosis not present

## 2015-02-17 DIAGNOSIS — E119 Type 2 diabetes mellitus without complications: Secondary | ICD-10-CM | POA: Diagnosis not present

## 2015-02-17 DIAGNOSIS — N2581 Secondary hyperparathyroidism of renal origin: Secondary | ICD-10-CM | POA: Diagnosis not present

## 2015-02-17 DIAGNOSIS — E214 Other specified disorders of parathyroid gland: Secondary | ICD-10-CM | POA: Diagnosis not present

## 2015-02-19 DIAGNOSIS — E214 Other specified disorders of parathyroid gland: Secondary | ICD-10-CM | POA: Diagnosis not present

## 2015-02-19 DIAGNOSIS — E119 Type 2 diabetes mellitus without complications: Secondary | ICD-10-CM | POA: Diagnosis not present

## 2015-02-19 DIAGNOSIS — N186 End stage renal disease: Secondary | ICD-10-CM | POA: Diagnosis not present

## 2015-02-19 DIAGNOSIS — N2581 Secondary hyperparathyroidism of renal origin: Secondary | ICD-10-CM | POA: Diagnosis not present

## 2015-02-20 ENCOUNTER — Encounter: Payer: Self-pay | Admitting: *Deleted

## 2015-02-21 DIAGNOSIS — E119 Type 2 diabetes mellitus without complications: Secondary | ICD-10-CM | POA: Diagnosis not present

## 2015-02-21 DIAGNOSIS — N2581 Secondary hyperparathyroidism of renal origin: Secondary | ICD-10-CM | POA: Diagnosis not present

## 2015-02-21 DIAGNOSIS — E214 Other specified disorders of parathyroid gland: Secondary | ICD-10-CM | POA: Diagnosis not present

## 2015-02-21 DIAGNOSIS — N186 End stage renal disease: Secondary | ICD-10-CM | POA: Diagnosis not present

## 2015-02-24 DIAGNOSIS — E214 Other specified disorders of parathyroid gland: Secondary | ICD-10-CM | POA: Diagnosis not present

## 2015-02-24 DIAGNOSIS — N186 End stage renal disease: Secondary | ICD-10-CM | POA: Diagnosis not present

## 2015-02-24 DIAGNOSIS — E119 Type 2 diabetes mellitus without complications: Secondary | ICD-10-CM | POA: Diagnosis not present

## 2015-02-24 DIAGNOSIS — N2581 Secondary hyperparathyroidism of renal origin: Secondary | ICD-10-CM | POA: Diagnosis not present

## 2015-02-27 DIAGNOSIS — E214 Other specified disorders of parathyroid gland: Secondary | ICD-10-CM | POA: Diagnosis not present

## 2015-02-27 DIAGNOSIS — N2581 Secondary hyperparathyroidism of renal origin: Secondary | ICD-10-CM | POA: Diagnosis not present

## 2015-02-27 DIAGNOSIS — N186 End stage renal disease: Secondary | ICD-10-CM | POA: Diagnosis not present

## 2015-02-27 DIAGNOSIS — E119 Type 2 diabetes mellitus without complications: Secondary | ICD-10-CM | POA: Diagnosis not present

## 2015-02-28 ENCOUNTER — Encounter: Payer: Self-pay | Admitting: Vascular Surgery

## 2015-02-28 DIAGNOSIS — E119 Type 2 diabetes mellitus without complications: Secondary | ICD-10-CM | POA: Diagnosis not present

## 2015-02-28 DIAGNOSIS — N186 End stage renal disease: Secondary | ICD-10-CM | POA: Diagnosis not present

## 2015-02-28 DIAGNOSIS — E214 Other specified disorders of parathyroid gland: Secondary | ICD-10-CM | POA: Diagnosis not present

## 2015-02-28 DIAGNOSIS — N2581 Secondary hyperparathyroidism of renal origin: Secondary | ICD-10-CM | POA: Diagnosis not present

## 2015-03-03 DIAGNOSIS — E214 Other specified disorders of parathyroid gland: Secondary | ICD-10-CM | POA: Diagnosis not present

## 2015-03-03 DIAGNOSIS — N2581 Secondary hyperparathyroidism of renal origin: Secondary | ICD-10-CM | POA: Diagnosis not present

## 2015-03-03 DIAGNOSIS — E119 Type 2 diabetes mellitus without complications: Secondary | ICD-10-CM | POA: Diagnosis not present

## 2015-03-03 DIAGNOSIS — N186 End stage renal disease: Secondary | ICD-10-CM | POA: Diagnosis not present

## 2015-03-05 DIAGNOSIS — N186 End stage renal disease: Secondary | ICD-10-CM | POA: Diagnosis not present

## 2015-03-05 DIAGNOSIS — N2581 Secondary hyperparathyroidism of renal origin: Secondary | ICD-10-CM | POA: Diagnosis not present

## 2015-03-05 DIAGNOSIS — E119 Type 2 diabetes mellitus without complications: Secondary | ICD-10-CM | POA: Diagnosis not present

## 2015-03-05 DIAGNOSIS — E214 Other specified disorders of parathyroid gland: Secondary | ICD-10-CM | POA: Diagnosis not present

## 2015-03-07 DIAGNOSIS — E119 Type 2 diabetes mellitus without complications: Secondary | ICD-10-CM | POA: Diagnosis not present

## 2015-03-07 DIAGNOSIS — N186 End stage renal disease: Secondary | ICD-10-CM | POA: Diagnosis not present

## 2015-03-07 DIAGNOSIS — E214 Other specified disorders of parathyroid gland: Secondary | ICD-10-CM | POA: Diagnosis not present

## 2015-03-07 DIAGNOSIS — N2581 Secondary hyperparathyroidism of renal origin: Secondary | ICD-10-CM | POA: Diagnosis not present

## 2015-03-10 DIAGNOSIS — N2581 Secondary hyperparathyroidism of renal origin: Secondary | ICD-10-CM | POA: Diagnosis not present

## 2015-03-10 DIAGNOSIS — N186 End stage renal disease: Secondary | ICD-10-CM | POA: Diagnosis not present

## 2015-03-10 DIAGNOSIS — E119 Type 2 diabetes mellitus without complications: Secondary | ICD-10-CM | POA: Diagnosis not present

## 2015-03-10 DIAGNOSIS — E214 Other specified disorders of parathyroid gland: Secondary | ICD-10-CM | POA: Diagnosis not present

## 2015-03-11 ENCOUNTER — Other Ambulatory Visit: Payer: Self-pay | Admitting: *Deleted

## 2015-03-11 ENCOUNTER — Encounter: Payer: Self-pay | Admitting: Vascular Surgery

## 2015-03-11 ENCOUNTER — Ambulatory Visit (HOSPITAL_COMMUNITY)
Admission: RE | Admit: 2015-03-11 | Discharge: 2015-03-11 | Disposition: A | Discharge status: Home / Self Care | Payer: Medicare Other | Source: Ambulatory Visit | Attending: Vascular Surgery | Admitting: Vascular Surgery

## 2015-03-11 ENCOUNTER — Ambulatory Visit (INDEPENDENT_AMBULATORY_CARE_PROVIDER_SITE_OTHER): Payer: Self-pay | Admitting: Vascular Surgery

## 2015-03-11 VITALS — BP 174/72 | HR 82 | Ht 68.0 in | Wt 191.0 lb

## 2015-03-11 DIAGNOSIS — I255 Ischemic cardiomyopathy: Secondary | ICD-10-CM | POA: Insufficient documentation

## 2015-03-11 DIAGNOSIS — Z4931 Encounter for adequacy testing for hemodialysis: Secondary | ICD-10-CM | POA: Diagnosis not present

## 2015-03-11 DIAGNOSIS — Z992 Dependence on renal dialysis: Secondary | ICD-10-CM | POA: Insufficient documentation

## 2015-03-11 DIAGNOSIS — E1122 Type 2 diabetes mellitus with diabetic chronic kidney disease: Secondary | ICD-10-CM | POA: Diagnosis not present

## 2015-03-11 DIAGNOSIS — N186 End stage renal disease: Secondary | ICD-10-CM | POA: Diagnosis not present

## 2015-03-11 DIAGNOSIS — E1142 Type 2 diabetes mellitus with diabetic polyneuropathy: Secondary | ICD-10-CM | POA: Insufficient documentation

## 2015-03-11 DIAGNOSIS — I1311 Hypertensive heart and chronic kidney disease without heart failure, with stage 5 chronic kidney disease, or end stage renal disease: Secondary | ICD-10-CM | POA: Diagnosis not present

## 2015-03-11 DIAGNOSIS — E785 Hyperlipidemia, unspecified: Secondary | ICD-10-CM | POA: Insufficient documentation

## 2015-03-11 DIAGNOSIS — G8918 Other acute postprocedural pain: Secondary | ICD-10-CM

## 2015-03-11 MED ORDER — HYDROCODONE-ACETAMINOPHEN 5-325 MG PO TABS: 1.0000 | ORAL_TABLET | Freq: Four times a day (QID) | ORAL | Status: DC | PRN

## 2015-03-11 NOTE — Addendum Note (Signed)
Addended by: Dorthula Rue L on: 03/11/2015 02:47 PM   Modules accepted: Orders

## 2015-03-11 NOTE — Telephone Encounter (Signed)
Patient wife requested and will pick up 

## 2015-03-11 NOTE — Progress Notes (Signed)
Subjective:     Patient ID: Aaron Long, male   DOB: 1940/09/27, 75 y.o.   MRN: [id number]  HPI this 75 year old male with end-stage renal disease had left brachial-cephalic AV fistula created by me in  December 2016. He denies any pain or numbness in the left hand. He is being dialyzed through a catheter in the right IJ.   Review of Systems     Objective:   Physical Exam BP 174/72 mmHg  Pulse 82  Ht 5\' 8"  (1.727 m)  Wt 191 lb (86.637 kg)  BMI 29.05 kg/m2  SpO2 99%   under well-developed well-nourished male in no apparent distress  Bilateral below-knee dictation Left upper extremity with well-healed incisions and excellent pulse and palpable thrill in brachialcephalic AV fistula. 2+ radial pulse palpable and well-perfused left hand.   today I ordered a duplex scan of the fistula which reveals no significant side branches. There is excellent flow in the fistula.     Assessment:      nicely functioning left brachial-cephalic AV fistula created 3 months ago with no evidence of significant competing branches and no evidence of steal syndrome     Plan:      okay to utilize left brachial-cephalic AV fistula at any time. After a few weeks of usage  Please let us know and we will arrange removal of the HD catheter in the right IJ

## 2015-03-12 DIAGNOSIS — N186 End stage renal disease: Secondary | ICD-10-CM | POA: Diagnosis not present

## 2015-03-12 DIAGNOSIS — E119 Type 2 diabetes mellitus without complications: Secondary | ICD-10-CM | POA: Diagnosis not present

## 2015-03-12 DIAGNOSIS — N2581 Secondary hyperparathyroidism of renal origin: Secondary | ICD-10-CM | POA: Diagnosis not present

## 2015-03-14 DIAGNOSIS — N186 End stage renal disease: Secondary | ICD-10-CM | POA: Diagnosis not present

## 2015-03-14 DIAGNOSIS — N2581 Secondary hyperparathyroidism of renal origin: Secondary | ICD-10-CM | POA: Diagnosis not present

## 2015-03-14 DIAGNOSIS — E119 Type 2 diabetes mellitus without complications: Secondary | ICD-10-CM | POA: Diagnosis not present

## 2015-03-17 DIAGNOSIS — N2581 Secondary hyperparathyroidism of renal origin: Secondary | ICD-10-CM | POA: Diagnosis not present

## 2015-03-17 DIAGNOSIS — N186 End stage renal disease: Secondary | ICD-10-CM | POA: Diagnosis not present

## 2015-03-17 DIAGNOSIS — E119 Type 2 diabetes mellitus without complications: Secondary | ICD-10-CM | POA: Diagnosis not present

## 2015-03-19 DIAGNOSIS — N2581 Secondary hyperparathyroidism of renal origin: Secondary | ICD-10-CM | POA: Diagnosis not present

## 2015-03-19 DIAGNOSIS — N186 End stage renal disease: Secondary | ICD-10-CM | POA: Diagnosis not present

## 2015-03-19 DIAGNOSIS — E119 Type 2 diabetes mellitus without complications: Secondary | ICD-10-CM | POA: Diagnosis not present

## 2015-03-20 ENCOUNTER — Ambulatory Visit: Payer: Medicare Other | Attending: Nurse Practitioner | Admitting: Physical Therapy

## 2015-03-20 ENCOUNTER — Encounter: Payer: Self-pay | Admitting: Physical Therapy

## 2015-03-20 DIAGNOSIS — R531 Weakness: Secondary | ICD-10-CM | POA: Insufficient documentation

## 2015-03-20 DIAGNOSIS — Z89512 Acquired absence of left leg below knee: Secondary | ICD-10-CM | POA: Diagnosis not present

## 2015-03-20 DIAGNOSIS — Z89511 Acquired absence of right leg below knee: Secondary | ICD-10-CM | POA: Diagnosis not present

## 2015-03-20 DIAGNOSIS — Z7409 Other reduced mobility: Secondary | ICD-10-CM | POA: Insufficient documentation

## 2015-03-20 DIAGNOSIS — M24562 Contracture, left knee: Secondary | ICD-10-CM | POA: Diagnosis not present

## 2015-03-20 DIAGNOSIS — M24561 Contracture, right knee: Secondary | ICD-10-CM

## 2015-03-20 DIAGNOSIS — R29818 Other symptoms and signs involving the nervous system: Secondary | ICD-10-CM | POA: Diagnosis not present

## 2015-03-20 DIAGNOSIS — R2991 Unspecified symptoms and signs involving the musculoskeletal system: Secondary | ICD-10-CM | POA: Diagnosis not present

## 2015-03-20 DIAGNOSIS — R2689 Other abnormalities of gait and mobility: Secondary | ICD-10-CM

## 2015-03-20 NOTE — Therapy (Signed)
[facility] [address] [place], [place], [address] Phone: [phone]   Fax:  [phone]  Physical Therapy Evaluation  Patient Details  Name: Aaron Long MRN: [id number] Date of Birth: 11/25/1940 No Data Recorded  Encounter Date: 03/20/2015      PT End of Session - 03/20/15 1201    Visit Number 1   Number of Visits 1   Authorization Type Medicare   PT Start Time 1020   PT Stop Time 1140   PT Time Calculation (min) 80 min   Activity Tolerance Patient tolerated treatment well   Behavior During Therapy [facility] for tasks assessed/performed      Past Medical History  Diagnosis Date  . PAD (peripheral artery disease) ([place])     a. R CEA 2007. b. Hx L BKA in 2013. c. s/p LE angioplasty in 2014. d. Hx R BKA in 2014.  . Gangrene of toe (HCC)     dry  . Neuromuscular disorder ([place])     diabetic neuropathy  . Hypertension   . Hyperlipidemia   . Peripheral neuropathy ([place])   . Constipation     takes Miralax daily  . Hemorrhoids   . Hx of colonic polyps   . Coronary artery disease     a. s/p NSTEMI/CABGx4 in 2007 - LIMA-LAD, SVG-optional diagonal, SVVG-OM, SVG-dRCA. b. Nuc 05/2011: nonischemic.  [name] BPH (benign prostatic hypertrophy)   . Abnormality of gait   . DVT (deep venous thrombosis) ([place])     a. Lower extremity DVT in 2013.  [name] Hypertrophy of prostate without urinary obstruction and other lower urinary tract symptoms (LUTS)   . Disorder of bone and cartilage, unspecified   . Lower limb amputation, below knee   . Anemia   . Secondary hyperparathyroidism ([place])     Secondary Hyperpara- Thyroidism, Renal  . Ischemic cardiomyopathy     a. EF 40% in 2007. b. 51% by nuc 05/2011.  [name] Complication of anesthesia     "he gets delirious"  . Myocardial infarction ([place]) 2005  . Type II diabetes mellitus ([place])   . Diabetic peripheral neuropathy ([place])   . Prostate cancer ([place]) 2010  . ESRD on dialysis [facility])     M-W-F   . Kidney stones     Past Surgical History  Procedure Laterality Date  . Prostatectomy  2009  . Knee cartilage surgery Left 1964  . Coronary artery bypass graft  2005    CABG X4  . Carotid endarterectomy Right 2005  . Cataract extraction w/ intraocular lens  implant, bilateral Bilateral 2011  . Colonoscopy    . Cardiac catheterization  05/28/11  . Amputation  06/14/2011    Procedure: AMPUTATION DIGIT;  Surgeon: Elam Dutch, MD;  Location: [facility] OR;  Service: Vascular;  Laterality: Left;  Amputation Left fifth toe  . Amputation  06/16/2011    Procedure: AMPUTATION BELOW KNEE;  Surgeon: Elam Dutch, MD;  Location: [place];  Service: Vascular;  Laterality: Left;  . I&d extremity  01/31/2012    Procedure: IRRIGATION AND DEBRIDEMENT EXTREMITY;  Surgeon: Elam Dutch, MD;  Location: [place];  Service: Vascular;  Laterality: Left;  I & D Left BKA   . Amputation Right 06/12/2012    Procedure: AMPUTATION DIGIT;  Surgeon: Elam Dutch, MD;  Location: [facility] OR;  Service: Vascular;  Laterality: Right;  GREAT TOE  . Amputation Right 08/08/2012    Procedure: AMPUTATION BELOW KNEE;  Surgeon: Elam Dutch, MD;  Location: [facility] OR;  Service: Vascular;  Laterality: Right;  . Abdominal aortagram N/A 05/28/2011    Procedure: ABDOMINAL AORTAGRAM;  Surgeon: Elam Dutch, MD;  Location: [facility] CATH LAB;  Service: Cardiovascular;  Laterality: N/A;  . Abdominal aortagram N/A 06/02/2012    Procedure: ABDOMINAL Maxcine Ham;  Surgeon: Elam Dutch, MD;  Location: [facility] CATH LAB;  Service: Cardiovascular;  Laterality: N/A;  . Abdominal aortagram N/A 06/09/2012    Procedure: ABDOMINAL Maxcine Ham;  Surgeon: Elam Dutch, MD;  Location: [facility] CATH LAB;  Service: Cardiovascular;  Laterality: N/A;  . Insertion of dialysis catheter Right 04/19/2014    Procedure: INSERTION OF DIALYSIS CATHETER-INTERNAL JUGULAR;  Surgeon: Mal Misty, MD;  Location: [place];  Service: Vascular;  Laterality: Right;  . Av fistula placement  Right 04/19/2014    Procedure: RIGHT ARM ARTERIOVENOUS (AV) FISTULA CREATION;  Surgeon: Mal Misty, MD;  Location: [place];  Service: Vascular;  Laterality: Right;  . Ligation of competing branches of arteriovenous fistula Right 06/19/2014    Procedure: Right Arm LIGATION OF COMPETING BRANCHES OF RADIOCEPHALIC ARTERIOVENOUS FISTULA;  Surgeon: Mal Misty, MD;  Location: [place];  Service: Vascular;  Laterality: Right;  . Cardiac catheterization N/A 10/04/2014    Procedure: Left Heart Cath and Coronary Angiography;  Surgeon: Sherren Mocha, MD;  Location: [place] CV LAB;  Service: Cardiovascular;  Laterality: N/A;  . Endarterectomy Left 10/17/2014    Procedure: ENDARTERECTOMY CAROTID;  Surgeon: Mal Misty, MD;  Location: [place];  Service: Vascular;  Laterality: Left;  . Ligation of arteriovenous  fistula Right 11/06/2014    Procedure: LIGATION OF RADIOCEPHALIC ARTERIOVENOUS FISTULA;  Surgeon: Mal Misty, MD;  Location: [place];  Service: Vascular;  Laterality: Right;  . Insertion of dialysis catheter N/A 11/06/2014    Procedure: INSERTION OF 23cm DIALYSIS CATHETER - right internal jugular;  Surgeon: Mal Misty, MD;  Location: [place];  Service: Vascular;  Laterality: N/A;  . Av fistula placement Left 12/12/2014    Procedure: BRACHIOCEPHALIC ARTERIOVENOUS (AV) FISTULA CREATION;  Surgeon: Mal Misty, MD;  Location: [place];  Service: Vascular;  Laterality: Left;    There were no vitals filed for this visit.  Visit Diagnosis:  Impaired functional mobility and activity tolerance  Balance problems  Status post bilateral below knee amputation (HCC)  Contractures of both knees  Weakness generalized  Impaired transfers    Mobility/Seating Evaluation    PATIENT INFORMATION: Name: Aaron Long. Aaron Long DOB: [birth year]  Sex: Male Date seen: 03/20/2015 Time: 10:15  Address:  [address] [place], [place], [place] [address] Physician: Aaron Mustache, PA This evaluation/justification form will  serve as the LMN for the following suppliers: __________________________ Supplier: [employer] Contact Person: Yvone Neu Phone:  [phone]   Seating Therapist: Jamey Reas, PT, DPT Phone:   [phone]   Phone: [phone]    Spouse/Parent/Caregiver name: Jru Pense, wife  Phone number: [phone] Insurance/Payer: Medicare [place], [employer]     Reason for Referral: To get power wheelchair  Patient/Caregiver Goals: to independently get around his house and perform activities of daily living  Patient was seen for face-to-face evaluation for new power wheelchair.  Also present was Laretta Alstrom, wife and Luz Brazen, APT to discuss recommendations and wheelchair options.  Further paperwork was completed and sent to vendor.  Patient appears to qualify for power mobility device at this time per objective findings.   MEDICAL HISTORY: Diagnosis: Primary Diagnosis: Bilateral Transtibial Amputations, PVD Onset: left  06/16/2011, right 06/12/2012 Diagnosis: ESRD, CAD, MI, CABG, DM, neuropathy, acute encephalopathy, TIA, prostate CA, cataract sg both eyes,   []Progressive Disease Relevant past and future surgeries: CABG, below knee amputations,     Height: 5'8" Weight: 181# Explain recent changes or trends in weight: lost 30# over last year since starting dialysis   History including Falls: 3 falls in last year with transfers, leaned forward, and flipped w/c backwards    HOME ENVIRONMENT: []House  []Condo/town home  [x]Apartment  []Assisted Living    []Lives Alone [x] Lives with Others                                                                                          Hours with caregiver: 14 hrs/day, he is alone 6am to 5pm weekdays.  [x]Home is accessible to patient           Stairs      []Yes [x] No     Ramp []Yes [x]No Comments:  first floor apartment with level entry with wide doors to fit wheelchair   COMMUNITY ADL: TRANSPORTATION: []Car    []Van    [x]Public  Transportation    []Adapted w/c Lift    []Ambulance    []Other:       [x]Sits in wheelchair during transport  Employment/School: goes to dialysis 3 days/wk.  Specific requirements pertaining to mobility ?????  Other: ?????    FUNCTIONAL/SENSORY PROCESSING SKILLS:  Handedness:   []Right     [x]Left    []NA  Comments:  ?????  Functional Processing Skills for Wheeled Mobility [x]Processing Skills are adequate for safe wheelchair operation  Areas of concern than may interfere with safe operation of wheelchair Description of problem   [] Attention to environment      []Judgment      [] Hearing  [] Vision or visual processing      []Motor Planning  [] Fluctuations in Behavior  ?????    VERBAL COMMUNICATION: [x]WFL receptive [x] WFL expressive []Understandable  []Difficult to understand  []non-communicative [] Uses an augmented communication device  CURRENT SEATING / MOBILITY: Current Mobility Base:  []None []Dependent [x]Manual []Scooter []Power  Type of Control: ?????  Manufacturer:  Drive Radiographer, therapeutic:  18" X 18" Age: 67 year  Current Condition of Mobility Base:  fair condition, seat is hammocked, general use pad   Current Wheelchair components:  ht adjustable arms, anti-tippers, brake extensors  Describe posture in present seating system:  sacral sitting      SENSATION and SKIN ISSUES: Sensation [x]Intact  []Impaired []Absent  Level of sensation: ????? Pressure Relief: Able to perform effective pressure relief :    []Yes  [x] No Method: use left arm to shift with minimal clearance of buttocks for only 2 seconds partial unweighting If not, Why?: use left arm to shift with minimal clearance of buttocks for only 2 seconds partial unweighting  Skin Issues/Skin Integrity Current Skin Issues  []Yes [x]No []Intact [] Red area[] Open Area  []Scar Tissue []At risk from prolonged sitting Where  ?????  History of Skin Issues  [x]Yes []No Where  sacral wound When  Dec 2016,  Hx of skin  flap surgeries  []Yes [x]No Where  ????? When  ?????  Limited sitting tolerance [x]Yes []No Hours spent sitting in wheelchair daily: sits in wheelchair up to 5 hr periods   Complaint of Pain:  Please describe: right hand and arm 8/10 sharp,    Swelling/Edema: right hand swells,    ADL STATUS (in reference to wheelchair use):  Indep Assist Unable Indep with Equip Not assessed Comments  Dressing ????? X ????? ????? ????? in bed, needs help with lower body, modified independent shirt  Eating X ????? ????? ????? ????? sits in wheelchair  Toileting ????? X ????? X ????? with dialysis makes limited urine but uses urinal in w/c independently, with bowel movements has elevated toilet seat with bars modified independent at times & assisted others  Bathing ????? X ????? ????? ????? wife does Sponge bath in bed  Grooming/Hygiene X ????? ????? ????? ????? sits in wheelchair  Meal Prep ????? ????? X ????? ????? wife does it  IADLS ????? ????? X ????? ????? wife does it  Bowel Management: []Continent  []Incontinent  [x]Accidents Comments:  does not have time always to make it bathroom  Bladder Management: []Continent  [x]Incontinent  [x]Accidents Comments:  pants are wet after dialysis and not aware     WHEELCHAIR SKILLS: Manual w/c Propulsion: []UE or LE strength and endurance sufficient to participate in ADLs using manual wheelchair Arm : []left []right   []Both      Distance: ????? Foot:  []left []right   []Both  Operate Scooter: [] Strength, hand grip, balance and transfer appropriate for use []Living environment is accessible for use of scooter  Operate Power w/c:  [x] Std. Joystick   [] Alternative Controls Indep [x] Assist [] Dependent/unable [] N/A []  [x]Safe          [x] Functional      Distance: ?????  Bed confined without wheelchair [x] Yes [] No   STRENGTH/RANGE OF MOTION:  Passive Range of Motion Strength  Shoulder right flexion 100*, left 110* right 3-/5, left 3-4/5  Elbow WFL  right 3/5, left 4/5  Wrist/Hand WFL grip right 1# with poor closure, left 14#  Hip WFL in sitting 3-/5 bilateral  Knee extension right -12*, left -20* flexion 90* bilaterally  3-/5  Ankle NA NA     MOBILITY/BALANCE:  [] Patient is totally dependent for mobility  ?????    Balance Transfers Ambulation  Sitting Balance: Standing Balance: [] Independent [] Independent/Modified Independent  [] WFL     [] [facility] [x] Supervision [] Supervision  [x] Uses UE for balance  [] Supervision [] Min Assist [] Ambulates with Assist  ?????    [] Min Assist [] Min assist [] Mod Assist [] Ambulates with Device:      [] RW  [] StW  [] Cane  [] ?????  [] Mod Assist [] Mod assist [] Max assist   [] Max Assist [] Max assist [] Dependent [] Indep. Short Distance Only  [] Unable [x] Unable [] Lift / Sling Required Distance (in feet)  ?????   [] Sliding board [x] Unable to Ambulate (see explanation below)  Cardio Status:  []Intact  [x] Impaired   [] NA     Resting HR 95 transfers & sitting balance 105  Respiratory Status:  []Intact   [x]Impaired   []NA     oxygen saturation stable at 95% but SOB with transfers  Orthotics/Prosthetics: Has bilateral prostheses to aid with transfers  Comments (Address manual vs power w/c vs scooter): sitting balance: static without UE 2 minutes but only takes minimal assist, reaches 5" anteriorly, left or across midline with right UE using LUE for support. Scooting transfer with supervision without armrests. Unable to stand or ambulate as bilateral amputations. He did not wear prostheses to evaluation but unable to stand or walk. Unable to propel manual wheelchair as right hand in not functional and cardiac status would limit to short distances 10-20'          Anterior / Posterior Obliquity Rotation-Pelvis ?????  PELVIS    [] [x] []  Neutral Posterior Anterior  [x] [] []  WFL Rt elev Lt elev  [x] [] []  WFL Right Left                      Anterior    Anterior     [] Fixed []  Other [x] Partly Flexible [] Flexible   [] Fixed [] Other [x] Partly Flexible  [] Flexible  [] Fixed [] Other [x] Partly Flexible  [] Flexible   TRUNK  [] [x] []  WFL ? Thoracic ? Lumbar  Kyphosis Lordosis  [x] [] []  WFL Convex Convex  Right Left []c-curve []s-curve []multiple  [x] Neutral [] Left-anterior [] Right-anterior     [] Fixed [] Flexible [x] Partly Flexible [] Other  [] Fixed [] Flexible [x] Partly Flexible [] Other  [] Fixed             [x] Flexible [] Partly Flexible [] Other    Position Windswept  ?????  HIPS          [x]           []              []   Neutral       Abduct        ADduct         [x]          []           []  Neutral Right           Left      [] Fixed [] Subluxed [x] Partly Flexible [] Dislocated [] Flexible  [] Fixed [] Other [] Partly Flexible  [] Flexible                 Foot Positioning Knee Positioning  no feet with amputations    [] [facility]  []Lt []Rt [x] [facility]  []Lt []Rt    KNEES ROM concerns: ROM concerns:    & Dorsi-Flexed []Lt []Rt ?????    FEET Plantar Flexed []Lt []Rt      Inversion                 []Lt []Rt      Eversion                 []Lt []Rt     HEAD [x] Functional [x] Good Head Control  ?????  & [] Flexed         [] Extended [] Adequate Head Control    NECK [] Rotated  Lt  [] Lat Flexed Lt [] Rotated  Rt [] Lat Flexed Rt [] Limited Head Control     [] Cervical Hyperextension [] Absent  Head Control     SHOULDERS ELBOWS WRIST& HAND right hand limited mobility      Left  Right    Left     Right    Left     Right   U/E []Functional           [x]Functional ????? ????? []Fisting             []Fisting      []elev   []dep      []elev   []dep       []pro -[]retract     []pro  []retract []subluxed             []subluxed           Goals for Wheelchair Mobility  [x] Independence with mobility in the home with motor related ADLs (MRADLs)  [x] Independence with MRADLs in the community [x] Provide dependent mobility   [x] Provide recline     [x]Provide tilt   Goals for Seating system [x] Optimize pressure distribution [x] Provide support needed to facilitate function or safety [x] Provide corrective forces to assist with maintaining or improving posture [x] Accommodate client's posture:   current seated postures and positions are not flexible or will not tolerate corrective forces [x] Client to be independent with relieving pressure in the wheelchair [x]Enhance physiological function such as breathing, swallowing, digestion  Simulation ideas/Equipment trials:????? State why other equipment was unsuccessful:?????   MOBILITY BASE RECOMMENDATIONS and JUSTIFICATION: MOBILITY COMPONENT JUSTIFICATION  Manufacturer: QuantumModel: Q6 edge 2.0   Size: Width 18Seat Depth 20 [x]provide transport from point A to B      [x]promote Indep mobility  [x]is not a safe, functional ambulator [x]walker or cane inadequate []non-standard width/depth necessary to accommodate anatomical measurement [] ?????  []Manual Mobility Base []non-functional ambulator    []Scooter/POV  []can safely operate  []can safely transfer   []has adequate trunk stability  []cannot functionally propel manual w/c  [x]Power Mobility Base  [x]non-ambulatory  [x]cannot functionally propel manual wheelchair  [x] cannot functionally and safely operate scooter/POV [x]can safely operate and willing to  []Stroller Base []infant/child  []unable to propel manual wheelchair []allows for growth []non-functional ambulator []non-functional UE []Indep mobility is not a goal at this time  [x]Tilt  []Forward [x]Backward [x]Powered tilt  []Manual tilt  [x]change position against gravitational force on head and shoulders  [x]change position for pressure relief/cannot weight shift [x]transfers  []management of tone [x]rest periods []control edema []facilitate postural control  [] ?????  [x]Recline  [x]Power recline on power base []Manual recline on  manual base  [x]accommodate femur to back angle  [x]bring to full recline for ADL care  [x]change position for pressure relief/cannot weight shift [x]rest periods [x]repositioning for transfers or clothing/diaper /catheter changes []head positioning  []Lighter weight required []self- propulsion  []lifting [] ?????  []Heavy Duty required []user weight greater than 250# []extreme tone/ over active movement []broken frame on previous chair [] ?????  [x] Back  [] Angle Adjustable [x] Custom molded sport back []postural control []control of tone/spasticity []accommodation of range of motion []UE functional control []accommodation for seating system [] ????? [x]provide lateral trunk support []accommodate deformity [x]provide posterior trunk support []provide lumbar/sacral support [x]support trunk in midline [x]Pressure relief over spinal processes  [x] Seat Cushion comfort company [employer] skin protection with incontinence cover [x]impaired sensation  []decubitus ulcers present [x]history of pressure ulceration []prevent pelvic extension [x]low maintenance  [x]stabilize pelvis  []accommodate obliquity []accommodate multiple deformity []neutralize lower extremity position [x]increase pressure distribution [x] incontinence issues  [] Pelvic/thigh support  [] Lateral thigh guide [] Distal medial pad  []  Distal lateral pad [] pelvis in neutral []accommodate pelvis [] position upper legs [] alignment [] accommodate ROM [] decr adduction []accommodate tone []removable for transfers []decr abduction  [] Lateral trunk Supports [] Lt     [] Rt []decrease lateral trunk leaning []control tone []contour for increased contact []safety  []accommodate asymmetry [] ?????  [x] Mounting hardware  []lateral trunk supports  []back   []seat [x]headrest      [] thigh support []fixed   []swing away []attach seat platform/cushion to w/c frame []attach back cushion to w/c frame []mount postural  supports [x]mount headrest  []swing medial thigh support away []swing lateral supports away for transfers  [] ?????    Armrests  []fixed [x]adjustable height []removable   []swing away  [x]flip back   []reclining [x]full length pads []desk    []pads tubular  [x]provide support with elbow at 90   []provide support for w/c tray [x]change of height/angles for variable activities [x]remove for transfers [x]allow to come closer to table top [x]remove for access to tables [] ?????  Hangers/ Leg rests  []60 [x]70 []90 []elevating []heavy duty  []articulating []fixed []lift off [x]swing away     []power [x]provide LE support  []accommodate to hamstring tightness []elevate legs during recline   [x]provide change in position for Legs []Maintain placement of feet on footplate []durability [x]enable transfers []decrease edema []Accommodate lower leg length [] ?????  Foot support Footplate    []Lt  [] Rt  [] Center mount []flip up     []depth/angle adjustable []Amputee adapter    [] Lt     [] Rt [x]provide foot support []accommodate to ankle ROM [x]transfers []Provide support for residual extremity [] allow foot to go under wheelchair base [] decrease tone  [] ?????  [] Ankle strap/heel loops []support foot on foot support []decrease extraneous movement []provide input to heel  []protect foot  Tires: []pneumatic  [x]flat free inserts  []solid  [x]decrease maintenance  [x]prevent frequent flats []increase shock absorbency []decrease pain from road shock []decrease spasms from road shock [] ?????  [x] Headrest  [x]provide posterior head support []provide posterior neck support []provide lateral head support []provide anterior head support []support during tilt and recline []improve feeding   []improve respiration []placement of switches [x]safety  []accommodate ROM  []accommodate tone [x]improve visual orientation  [] Anterior chest strap [] Vest [] Shoulder retractors   []decrease forward movement of shoulder []accommodation of TLSO []decrease forward movement of trunk []decrease shoulder elevation []added abdominal support []alignment []assistance with shoulder control  [] ?????  Pelvic Positioner [x]Belt []SubASIS bar []Dual Pull []stabilize tone [x]decrease falling out of chair/ **will not Decr potential for sliding due to pelvic tilting []prevent excessive rotation []pad for protection over boney prominence []prominence comfort []special pull angle to control rotation [] ?????  Upper Extremity Support []L   [] R []Arm trough    []hand support [] tray       []full tray []swivel mount []decrease edema      []decrease subluxation   []control tone   []placement for AAC/Computer/EADL []decrease gravitational pull on shoulders []provide midline positioning []provide support to increase UE function []provide hand support in natural position []provide work surface   POWER WHEELCHAIR CONTROLS  [x]Proportional  []Non-Proportional Type joystick [x]Left  []Right [x]provides access for controlling wheelchair   []lacks motor control to operate proportional drive control []unable to understand proportional controls  Actuator Control Module  []Single  [x]Multiple   [x]Allow the client to  operate the power seat function(s) through the joystick control   []Safety Reset Switches []Used to change modes and stop the wheelchair when driving in latch mode    []Upgraded Electronics   []programming for accurate control []progressive Disease/changing condition []non-proportional drive control needed []Needed in order to operate power seat functions through joystick control   []Display box []Allows user to see in which mode and drive the wheelchair is set  []necessary for alternate controls    []Digital interface electronics []Allows w/c to operate when using alternative drive controls  []ASL Head Array []Allows client to operate wheelchair  through switches  placed in tri-panel headrest  []Sip and puff with tubing kit []needed to operate sip and puff drive controls  []Upgraded tracking electronics []increase safety when driving []correct tracking when on uneven surfaces  [x]Mount for switches or joystick [x]Attaches switches to w/c  [x]Swing away for access or transfers []midline for optimal placement []provides for consistent access  []Attendant controlled joystick plus mount []safety []long distance driving []operation of seat functions []compliance with transportation regulations [] ?????    Rear wheel placement/Axle adjustability []None []semi adjustable []fully adjustable  []improved UE access to wheels []improved stability []changing angle in space for improvement of postural stability []1-arm drive access []amputee pad placement [] ?????  Wheel rims/ hand rims  []metal  []plastic coated []oblique projections []vertical projections []Provide ability to propel manual wheelchair  [] Increase self-propulsion with hand weakness/decreased grasp  Push handles []extended  []angle adjustable  []standard []caregiver access []caregiver assist []allows "hooking" to enable increased ability to perform ADLs or maintain balance  One armed device  []Lt   []Rt []enable propulsion of manual wheelchair with one arm   [] ?????   Brake/wheel lock extension [] Lt   [] Rt []increase indep in applying wheel locks   []Side guards []prevent clothing getting caught in wheel or becoming soiled [] prevent skin tears/abrasions  Battery: NF 22 X 2 [x]to power wheelchair ?????  Other: ????? ????? ?????  The above equipment has a life- long use expectancy. Growth and changes in medical and/or functional conditions would be the exceptions. This is to certify that the therapist has no financial relationship with durable medical provider or manufacturer. The therapist will not receive remuneration of any kind for the equipment recommended in this evaluation.    Patient has mobility limitation that significantly impairs safe, timely participation in one or more mobility related ADL's.  (bathing, toileting, feeding, dressing, grooming, moving from room to room)                                                             [x] Yes [] No Will mobility device sufficiently improve ability to participate and/or be aided in participation of MRADL's?         [x] Yes [] No Can limitation be compensated for with use of a cane or walker?                                                                                []  Yes [x] No Does patient or caregiver demonstrate ability/potential ability & willingness to safely use the mobility device?   [x] Yes [] No Does patient's home environment support use of recommended mobility device?                                                    [x] Yes [] No Does patient have sufficient upper extremity function necessary to functionally propel a manual wheelchair?    [] Yes [x] No Does patient have sufficient strength and trunk stability to safely operate a POV (scooter)?                                  [] Yes [x] No Does patient need additional features/benefits provided by a power wheelchair for MRADL's in the home?       [x] Yes [] No Does the patient demonstrate the ability to safely use a power wheelchair?                                                              [x] Yes [] No  Therapist Name Printed: Jamey Reas, PT, DPT Date: April 07, 2015  Therapist's Signature:   Date:   Supplier's Name Printed: [employer], [employer] Date: April 07, 2015  Supplier's Signature:   Date:  Patient/Caregiver Signature:   Date:     This is to certify that I have read this evaluation and do agree with the content within:    Physician's Name Printed: Aaron Long, [place]  Physician's Signature:  Date:     This is to certify that I, the above signed therapist have the following affiliations: [] This DME provider [] Manufacturer of recommended  equipment [] Patient's lg tm reacit[x] nef e ave                          Plan - 04-07-2015 1202    Clinical Impression Statement Patient appears to qualify for group 3 power wheelchair. See notes for details.   Pt will benefit from skilled therapeutic intervention in order to improve on the following deficits Decreased strength;Decreased activity tolerance;Decreased balance;Decreased skin integrity   PT Frequency One time visit   Consulted and Agree with Plan of Care Patient;Family member/caregiver   Family Member Consulted Madoc Holquin, wife          G-Codes - 04/07/15 1204    Functional Assessment Tool Used non-ambulatory even with prostheses & walker; unable to propel manual wheelchair safely. Scooting transfers with supervision without armrests.   Functional Limitation Mobility: Walking and moving around   Mobility: Walking and Moving Around Current Status [phone]) At least 80 percent but less than 100 percent impaired, limited or restricted   Mobility: Walking and Moving Around Goal Status [phone]) At least 80 percent but less than 100 percent impaired, limited or restricted   Mobility: Walking and Moving Around Discharge Status [id number]) At least 80 percent but less than 100 percent impaired, limited or restricted             P Patient  Active Problem List   Diagnosis Date Noted  . End stage renal disease ([place]) 11/05/2014  . Numbness and tingling in right hand 11/05/2014  . Right hand pain 11/05/2014  . Carotid stenosis   . Preop cardiovascular exam 10/02/2014  . TIA (transient ischemic attack) 09/30/2014  . Fever and chills 09/08/2014  . Acute encephalopathy 09/08/2014  . ESRD on dialysis ([place])   . Anemia of chronic disease 05/02/2014  . Insomnia due to stress 05/02/2014  . Physical deconditioning 04/25/2014  . Syncope 04/17/2014  . Uncontrolled hypertension   . Chest pain 04/08/2014  . Acute on chronic renal failure ([place]) 04/08/2014  .  Hypertensive urgency 04/08/2014  . Status post bilateral below knee amputation ([place]) 04/02/2014  . Other constipation 09/26/2013  . Cervical disc disorder with radiculopathy of cervical region 08/07/2013  . Drug-induced constipation 06/13/2013  . DM type 2, uncontrolled, with renal complications ([place]) 09/32/3557  . Hypertensive renal disease 06/13/2013  . Encounter for therapeutic drug monitoring 06/13/2013  . DM type 2, uncontrolled, with neuropathy ([place]) 01/23/2013  . Hyperlipidemia with target LDL less than 100 12/06/2012  . Need for prophylactic vaccination and inoculation against influenza 09/30/2012  . Diabetes mellitus with peripheral vascular disease ([place]) 09/06/2012  . GERD (gastroesophageal reflux disease) 09/06/2012  . Aftercare following surgery of the circulatory system, [place] 08/03/2012  . Anemia in chronic renal disease 07/24/2012  . Atherosclerosis of native arteries of the extremities with ulceration(440.23) 06/01/2012  . Hyperkalemia 04/24/2012  . Diabetic neuropathy, type II diabetes mellitus ([place]) 07/27/2011  . Atherosclerosis of native arteries of the extremities with intermittent claudication 07/08/2011  . S/P BKA (below knee amputation) ([place]) 06/22/2011  . Atherosclerosis of native arteries of the extremities with gangrene ([place]) 06/10/2011  . CKD (chronic kidney disease) stage 4, GFR 15-29 ml/min (HCC) 05/02/2011  . HTN (hypertension) 04/28/2011  . CAD (coronary artery disease) 04/28/2011  . PAD (peripheral artery disease) ([place]) 04/28/2011  . Prostate cancer [facility]) 04/28/2011   04/1WALDRON,ROBINDRON,ROBIN3/9/20173/12:06 PM11:59 AM  [place] [id number] [phone]   [place] GBT:[id number] Date of Birth: 10-Jun-1940

## 2015-03-21 DIAGNOSIS — N186 End stage renal disease: Secondary | ICD-10-CM | POA: Diagnosis not present

## 2015-03-21 DIAGNOSIS — N2581 Secondary hyperparathyroidism of renal origin: Secondary | ICD-10-CM | POA: Diagnosis not present

## 2015-03-21 DIAGNOSIS — E119 Type 2 diabetes mellitus without complications: Secondary | ICD-10-CM | POA: Diagnosis not present

## 2015-03-24 DIAGNOSIS — E119 Type 2 diabetes mellitus without complications: Secondary | ICD-10-CM | POA: Diagnosis not present

## 2015-03-24 DIAGNOSIS — N186 End stage renal disease: Secondary | ICD-10-CM | POA: Diagnosis not present

## 2015-03-24 DIAGNOSIS — N2581 Secondary hyperparathyroidism of renal origin: Secondary | ICD-10-CM | POA: Diagnosis not present

## 2015-03-26 DIAGNOSIS — N2581 Secondary hyperparathyroidism of renal origin: Secondary | ICD-10-CM | POA: Diagnosis not present

## 2015-03-26 DIAGNOSIS — N186 End stage renal disease: Secondary | ICD-10-CM | POA: Diagnosis not present

## 2015-03-26 DIAGNOSIS — E119 Type 2 diabetes mellitus without complications: Secondary | ICD-10-CM | POA: Diagnosis not present

## 2015-03-28 DIAGNOSIS — N186 End stage renal disease: Secondary | ICD-10-CM | POA: Diagnosis not present

## 2015-03-28 DIAGNOSIS — N2581 Secondary hyperparathyroidism of renal origin: Secondary | ICD-10-CM | POA: Diagnosis not present

## 2015-03-28 DIAGNOSIS — E119 Type 2 diabetes mellitus without complications: Secondary | ICD-10-CM | POA: Diagnosis not present

## 2015-03-31 DIAGNOSIS — N186 End stage renal disease: Secondary | ICD-10-CM | POA: Diagnosis not present

## 2015-03-31 DIAGNOSIS — E119 Type 2 diabetes mellitus without complications: Secondary | ICD-10-CM | POA: Diagnosis not present

## 2015-03-31 DIAGNOSIS — N2581 Secondary hyperparathyroidism of renal origin: Secondary | ICD-10-CM | POA: Diagnosis not present

## 2015-04-02 DIAGNOSIS — N2581 Secondary hyperparathyroidism of renal origin: Secondary | ICD-10-CM | POA: Diagnosis not present

## 2015-04-02 DIAGNOSIS — E119 Type 2 diabetes mellitus without complications: Secondary | ICD-10-CM | POA: Diagnosis not present

## 2015-04-02 DIAGNOSIS — N186 End stage renal disease: Secondary | ICD-10-CM | POA: Diagnosis not present

## 2015-04-04 DIAGNOSIS — E119 Type 2 diabetes mellitus without complications: Secondary | ICD-10-CM | POA: Diagnosis not present

## 2015-04-04 DIAGNOSIS — N2581 Secondary hyperparathyroidism of renal origin: Secondary | ICD-10-CM | POA: Diagnosis not present

## 2015-04-04 DIAGNOSIS — N186 End stage renal disease: Secondary | ICD-10-CM | POA: Diagnosis not present

## 2015-04-07 DIAGNOSIS — E119 Type 2 diabetes mellitus without complications: Secondary | ICD-10-CM | POA: Diagnosis not present

## 2015-04-07 DIAGNOSIS — N186 End stage renal disease: Secondary | ICD-10-CM | POA: Diagnosis not present

## 2015-04-07 DIAGNOSIS — N2581 Secondary hyperparathyroidism of renal origin: Secondary | ICD-10-CM | POA: Diagnosis not present

## 2015-04-09 DIAGNOSIS — N186 End stage renal disease: Secondary | ICD-10-CM | POA: Diagnosis not present

## 2015-04-09 DIAGNOSIS — N2581 Secondary hyperparathyroidism of renal origin: Secondary | ICD-10-CM | POA: Diagnosis not present

## 2015-04-09 DIAGNOSIS — E119 Type 2 diabetes mellitus without complications: Secondary | ICD-10-CM | POA: Diagnosis not present

## 2015-04-10 ENCOUNTER — Encounter: Payer: Self-pay | Admitting: Internal Medicine

## 2015-04-10 ENCOUNTER — Ambulatory Visit (INDEPENDENT_AMBULATORY_CARE_PROVIDER_SITE_OTHER): Payer: Medicare Other | Admitting: Internal Medicine

## 2015-04-10 VITALS — BP 132/60 | HR 85 | Temp 98.4°F | Resp 20 | Ht 68.0 in | Wt 191.0 lb

## 2015-04-10 DIAGNOSIS — N186 End stage renal disease: Secondary | ICD-10-CM | POA: Diagnosis not present

## 2015-04-10 DIAGNOSIS — E785 Hyperlipidemia, unspecified: Secondary | ICD-10-CM | POA: Diagnosis not present

## 2015-04-10 DIAGNOSIS — Z992 Dependence on renal dialysis: Secondary | ICD-10-CM | POA: Diagnosis not present

## 2015-04-10 DIAGNOSIS — G8918 Other acute postprocedural pain: Secondary | ICD-10-CM | POA: Diagnosis not present

## 2015-04-10 DIAGNOSIS — I2510 Atherosclerotic heart disease of native coronary artery without angina pectoris: Secondary | ICD-10-CM | POA: Diagnosis not present

## 2015-04-10 DIAGNOSIS — R52 Pain, unspecified: Secondary | ICD-10-CM

## 2015-04-10 DIAGNOSIS — Z794 Long term (current) use of insulin: Secondary | ICD-10-CM

## 2015-04-10 DIAGNOSIS — I739 Peripheral vascular disease, unspecified: Secondary | ICD-10-CM

## 2015-04-10 DIAGNOSIS — IMO0002 Reserved for concepts with insufficient information to code with codable children: Secondary | ICD-10-CM

## 2015-04-10 DIAGNOSIS — E1122 Type 2 diabetes mellitus with diabetic chronic kidney disease: Secondary | ICD-10-CM | POA: Diagnosis not present

## 2015-04-10 DIAGNOSIS — E1165 Type 2 diabetes mellitus with hyperglycemia: Secondary | ICD-10-CM

## 2015-04-10 DIAGNOSIS — G546 Phantom limb syndrome with pain: Secondary | ICD-10-CM

## 2015-04-10 MED ORDER — HYDROCODONE-ACETAMINOPHEN 5-325 MG PO TABS
1.0000 | ORAL_TABLET | Freq: Four times a day (QID) | ORAL | Status: DC | PRN
Start: 2015-04-10 — End: 2015-05-09

## 2015-04-10 NOTE — Progress Notes (Signed)
Patient ID: Aaron Long, male   DOB: 08/22/1940, 75 y.o.   MRN: [id number]   Location:  [facility] clinic Provider:  Jareb Radoncic L. Mariea Clonts, D.O., C.M.D.  Code Status: full code Goals of Care:  Advanced Directives 03/20/2015  Does patient have an advance directive? Yes  Type of Paramedic of [place];Living will  Copy of advanced directive(s) in chart? No - copy requested  Would patient like information on creating an advanced directive? -    Chief Complaint  Patient presents with  . Medical Management of Chronic Issues    HPI: Patient is a 75 y.o. male seen today for medical management of chronic diseases.  He's been feeling good.  No complaints.    Not walking with his prostheses since he's been on HD.  Does not have the strength to walk.  ESRD on HD:  Gets boring.  Sometimes hypotensive and has to stop and then put him back on.  No chest pains there.   Phantom pain:  Has the ache in leg and knows it isn't his feet, but feels like it.  Has lyrica for that pain--used to take 100mg  before, but decreased dose due to renal function.  Also uses hydrocodone.  Has had pain and difficulty moving his right hand since fistula was removed from the right forearm.  Has right chest HD catheter.  Can't close his fingers on his own.  Now has fistula in left upper arm that is being used.   Uses a ball to squeeze for his hand to exercise it.  Is better than it used to be.   DMII:  Checks sugar some at home, but none lately.  Last hba1c was 12.  [name] wonders if a nurse could come out to help with his sugar and she's not home and cannot see well plus he's not doing it himself.  Suspect he's also not taking his insulin.  Says his mood is ok with seroquel.    He is trying to get a power chair.    Past Medical History  Diagnosis Date  . PAD (peripheral artery disease) ([place])     a. R CEA 2007. b. Hx L BKA in 2013. c. s/p LE angioplasty in 2014. d. Hx R BKA in 2014.  . Gangrene of toe  (HCC)     dry  . Neuromuscular disorder ([place])     diabetic neuropathy  . Hypertension   . Hyperlipidemia   . Peripheral neuropathy ([place])   . Constipation     takes Miralax daily  . Hemorrhoids   . Hx of colonic polyps   . Coronary artery disease     a. s/p NSTEMI/CABGx4 in 2007 - LIMA-LAD, SVG-optional diagonal, SVVG-OM, SVG-dRCA. b. Nuc 05/2011: nonischemic.  [name] BPH (benign prostatic hypertrophy)   . Abnormality of gait   . DVT (deep venous thrombosis) ([place])     a. Lower extremity DVT in 2013.  [name] Hypertrophy of prostate without urinary obstruction and other lower urinary tract symptoms (LUTS)   . Disorder of bone and cartilage, unspecified   . Lower limb amputation, below knee   . Anemia   . Secondary hyperparathyroidism ([place])     Secondary Hyperpara- Thyroidism, Renal  . Ischemic cardiomyopathy     a. EF 40% in 2007. b. 51% by nuc 05/2011.  [name] Complication of anesthesia     "he gets delirious"  . Myocardial infarction ([place]) 2005  . Type II diabetes mellitus ([place])   . Diabetic peripheral neuropathy (  [place])   . Prostate cancer ([place]) 2010  . ESRD on dialysis [facility])     M-W-F  . Kidney stones     Past Surgical History  Procedure Laterality Date  . Prostatectomy  2009  . Knee cartilage surgery Left 1964  . Coronary artery bypass graft  2005    CABG X4  . Carotid endarterectomy Right 2005  . Cataract extraction w/ intraocular lens  implant, bilateral Bilateral 2011  . Colonoscopy    . Cardiac catheterization  05/28/11  . Amputation  06/14/2011    Procedure: AMPUTATION DIGIT;  Surgeon: Elam Dutch, MD;  Location[facility];  Service: Vascular;  Laterality: Left;  Amputation Left fifth toe  . Amputation  06/16/2011    Procedure: AMPUTATION BELOW KNEE;  Surgeon: Elam Dutch, MD;  Location: [place];  Service: Vascular;  Laterality: Left;  . I&d extremity  01/31/2012    Procedure: IRRIGATION AND DEBRIDEMENT EXTREMITY;  Surgeon: Elam Dutch, MD;  Location: [place];  Service:  Vascular;  Laterality: Left;  I & D Left BKA   . Amputation Right 06/12/2012    Procedure: AMPUTATION DIGIT;  Surgeon: Elam Dutch, MD;  Location: [facility] OR;  Service: Vascular;  Laterality: Right;  GREAT TOE  . Amputation Right 08/08/2012    Procedure: AMPUTATION BELOW KNEE;  Surgeon: Elam Dutch, MD;  Location: [facility] OR;  Service: Vascular;  Laterality: Right;  . Abdominal aortagram N/A 05/28/2011    Procedure: ABDOMINAL Maxcine Ham;  Surgeon: Elam Dutch, MD;  Location: [facility] CATH LAB;  Service: Cardiovascular;  Laterality: N/A;  . Abdominal aortagram N/A 06/02/2012    Procedure: ABDOMINAL Maxcine Ham;  Surgeon: Elam Dutch, MD;  Location: [facility] CATH LAB;  Service: Cardiovascular;  Laterality: N/A;  . Abdominal aortagram N/A 06/09/2012    Procedure: ABDOMINAL Maxcine Ham;  Surgeon: Elam Dutch, MD;  Location: [facility] CATH LAB;  Service: Cardiovascular;  Laterality: N/A;  . Insertion of dialysis catheter Right 04/19/2014    Procedure: INSERTION OF DIALYSIS CATHETER-INTERNAL JUGULAR;  Surgeon: Mal Misty, MD;  Location: [place];  Service: Vascular;  Laterality: Right;  . Av fistula placement Right 04/19/2014    Procedure: RIGHT ARM ARTERIOVENOUS (AV) FISTULA CREATION;  Surgeon: Mal Misty, MD;  Location: [place];  Service: Vascular;  Laterality: Right;  . Ligation of competing branches of arteriovenous fistula Right 06/19/2014    Procedure: Right Arm LIGATION OF COMPETING BRANCHES OF RADIOCEPHALIC ARTERIOVENOUS FISTULA;  Surgeon: Mal Misty, MD;  Location: [place];  Service: Vascular;  Laterality: Right;  . Cardiac catheterization N/A 10/04/2014    Procedure: Left Heart Cath and Coronary Angiography;  Surgeon: Sherren Mocha, MD;  Location: [place] CV LAB;  Service: Cardiovascular;  Laterality: N/A;  . Endarterectomy Left 10/17/2014    Procedure: ENDARTERECTOMY CAROTID;  Surgeon: Mal Misty, MD;  Location: [place];  Service: Vascular;  Laterality: Left;  . Ligation of arteriovenous  fistula  Right 11/06/2014    Procedure: LIGATION OF RADIOCEPHALIC ARTERIOVENOUS FISTULA;  Surgeon: Mal Misty, MD;  Location: [place];  Service: Vascular;  Laterality: Right;  . Insertion of dialysis catheter N/A 11/06/2014    Procedure: INSERTION OF 23cm DIALYSIS CATHETER - right internal jugular;  Surgeon: Mal Misty, MD;  Location: [place];  Service: Vascular;  Laterality: N/A;  . Av fistula placement Left 12/12/2014    Procedure: BRACHIOCEPHALIC ARTERIOVENOUS (AV) FISTULA CREATION;  Surgeon: Mal Misty, MD;  Location: [place];  Service: Vascular;  Laterality: Left;    Allergies  Allergen Reactions  . Oxycodone Other (See Comments)    Hallucinations      Medication List       This list is accurate as of: 04/10/15  3:10 PM.  Always use your most recent med list.               AMBULATORY NON FORMULARY MEDICATION  One Touch Ultra 2 Test Strips Sig:  Check blood sugars Three times daily to keep blood sugars under control Dx: E11.9     AMBULATORY NON FORMULARY MEDICATION  BD U/P Mini Pen [place] 31GX5MM Use three times daily as directed DX: E11.9     amLODipine 10 MG tablet  Commonly known as:  NORVASC  Take 1 tablet (10 mg total) by mouth daily.     aspirin EC 81 MG tablet  Take 1 tablet (81 mg total) by mouth every morning.     calcium acetate 667 MG capsule  Commonly known as:  PHOSLO  Take 1 capsule (667 mg total) by mouth 3 (three) times daily with meals.     clopidogrel 75 MG tablet  Commonly known as:  PLAVIX  Take 1 tablet (75 mg total) by mouth daily.     HYDROcodone-acetaminophen 5-325 MG tablet  Commonly known as:  NORCO/VICODIN  Take 1 tablet by mouth every 6 (six) hours as needed for moderate pain.     insulin aspart 100 UNIT/ML FlexPen  Commonly known as:  NOVOLOG  Inject 5 Units into the skin 3 (three) times daily with meals. Inject 5 units three times a day with meals if blood sugar greater than 150 only, DX: 250.71     Insulin Detemir 100 UNIT/ML Pen    Commonly known as:  LEVEMIR  Inject 17 Units into the skin 2 (two) times daily.     pravastatin 40 MG tablet  Commonly known as:  PRAVACHOL  Take 1 tablet (40 mg total) by mouth daily.     pregabalin 75 MG capsule  Commonly known as:  LYRICA  Take one tablet by mouth once daily for pains     QUEtiapine 25 MG tablet  Commonly known as:  SEROQUEL  Take 1 tablet (25 mg total) by mouth daily.     senna-docusate 8.6-50 MG tablet  Commonly known as:  Senokot-S  Take 2 tablets by mouth 2 (two) times daily.     tamsulosin 0.4 MG Caps capsule  Commonly known as:  FLOMAX  Take 1 capsule (0.4 mg total) by mouth daily.       Review of Systems:  Review of Systems  Constitutional: Positive for malaise/fatigue. Negative for fever and chills.  HENT: Negative for congestion.   Eyes:       Glasses  Respiratory: Negative for shortness of breath.   Cardiovascular: Negative for chest pain and palpitations.  Gastrointestinal: Negative for abdominal pain, constipation, blood in stool and melena.  Genitourinary: Negative for dysuria.  Musculoskeletal: Negative for falls.       Leg pain--phantom pain; also has pain in right hand and decreased grip strength, feels cold since fistula removal from right forearm  Skin: Positive for itching. Negative for rash.  Neurological: Negative for dizziness, loss of consciousness and weakness.  Psychiatric/Behavioral: Negative for depression and memory loss.    Health Maintenance  Topic Date Due  . ZOSTAVAX  12/31/1999  . URINE MICROALBUMIN  04/11/2014  . OPHTHALMOLOGY EXAM  07/06/2014  . INFLUENZA VACCINE  08/12/2014  . HEMOGLOBIN  A1C  07/10/2015  . COLONOSCOPY  11/26/2018  . TETANUS/TDAP  01/11/2021  . PNA vac Low Risk Adult  Completed    Physical Exam: Filed Vitals:   04/10/15 1455  BP: 132/60  Pulse: 85  Temp: 98.4 F (36.9 C)  TempSrc: Oral  Resp: 20  Height: 5\' 8"  (1.727 m)  Weight: 191 lb (86.637 kg)  SpO2: 96%   Body mass index  is 29.05 kg/(m^2). Physical Exam  Constitutional: He is oriented to person, place, and time. He appears well-developed. No distress.  Cardiovascular: Normal rate, regular rhythm and normal heart sounds.   Right chest HD catheter with site clean, dry, intact; left upper arm AV fistula with normal pulse and thrill  Pulmonary/Chest: Effort normal and breath sounds normal. No respiratory distress. He has no rales.  Abdominal: Soft. Bowel sounds are normal. He exhibits no distension. There is no tenderness.  Musculoskeletal:  Decreased hand grip of right hand (stiffness of fingers); bilateral BKAs  Neurological: He is alert and oriented to person, place, and time.  Skin: Skin is warm and dry.  Psychiatric: He has a normal mood and affect.    Labs reviewed: Basic Metabolic Panel:  Recent Labs  09/13/14 0915  10/02/14 0730 10/04/14 1345 10/15/14 1345  10/17/14 1600 10/18/14 0427 11/06/14 0805 12/12/14 0807  NA 135  < > 138 134* 139  < >  --  133* 140 134*  K 4.6  < > 4.2 4.0 4.7  < >  --  5.1 4.1 4.4  CL 99*  < > 101 98* 103  --   --  94*  --   --   CO2 26  < > 26 26 25   --   --  26  --   --   GLUCOSE 144*  < > 108* 123* 243*  < >  --  292* 212* 364*  BUN 31*  < > 57* 38* 42*  --   --  45*  --   --   CREATININE 5.64*  < > 7.41* 6.16* 5.91*  --  5.68* 6.14*  --   --   CALCIUM 9.0  < > 9.5 9.0 9.7  --   --  9.5  --   --   PHOS 5.1*  --  6.5* 5.8*  --   --   --   --   --   --   < > = values in this interval not displayed. Liver Function Tests:  Recent Labs  09/08/14 2035  09/30/14 2054 10/02/14 0730 10/04/14 1345 10/15/14 1345  AST 16  --  16  --   --  16  ALT 9*  --  8*  --   --  11*  ALKPHOS 108  --  70  --   --  114  BILITOT 0.5  --  0.6  --   --  0.4  PROT 5.9*  --  6.7  --   --  7.4  ALBUMIN 2.9*  < > 3.2* 2.9* 3.0* 3.5  < > = values in this interval not displayed.  Recent Labs  04/17/14 0700  LIPASE 50   No results for input(s): AMMONIA in the last 8760  hours. CBC:  Recent Labs  09/08/14 2035  09/09/14 1420  09/30/14 2054  10/15/14 1345  10/17/14 1600 10/18/14 0427 11/06/14 0805 12/12/14 0807  WBC 10.2  < > 9.0  < > 5.6  < > 8.6  --  15.2* 11.0*  --   --  NEUTROABS 7.6  --  6.7  --  2.5  --   --   --   --   --   --   --   HGB 10.3*  < > 10.1*  < > 11.1*  < > 12.3*  < > 12.0* 11.0* 10.9* 12.9*  HCT 31.3*  < > 31.5*  < > 35.1*  < > 39.2  < > 38.1* 34.3* 32.0* 38.0*  MCV 91.8  < > 92.9  < > 98.6  < > 99.5  --  99.5 100.0  --   --   PLT 168  < > 185  < > 207  < > 278  --  238 236  --   --   < > = values in this interval not displayed. Lipid Panel:  Recent Labs  10/01/14 0148  CHOL 178  HDL 29*  LDLCALC 117*  TRIG 158*  CHOLHDL 6.1   Lab Results  Component Value Date   HGBA1C 12.1* 01/09/2015    Assessment/Plan 1. Uncontrolled type 2 diabetes mellitus with chronic kidney disease on chronic dialysis, with long-term current use of insulin (HCC) -don't expect this to be any better as he is not even checking his glucose -emphasized importance of good control to prevent stroke, MI, and help keep his remaining limbs and vision - Hemoglobin A1c - Ambulatory referral to [facility] due to need for more diabetes education, help with fingersticks as his wife is legally blind, also ensure that he is taking his insulin properly and his medications b/c I suspect that's not happening either  2. ESRD on dialysis [facility]) - on Mon, Wed, Fri - notes he does sometimes have hypotension there that requires HD to be stopped temporarily and restarted - Comprehensive metabolic panel - Ambulatory referral to [facility]  3. Coronary artery disease involving native coronary artery of native heart without angina pectoris - no recent difficulties with chest pain or dyspnea - cont current regimen with plavix, pravachol, baby asa, amlodipine - CBC with Differential/Platelet - Comprehensive metabolic panel  4. PAD (peripheral artery disease)  (HCC) - s/p bilateral bkas--has phantom pain from these - Hemoglobin A1c - Lipid panel  5. Hyperlipidemia LDL goal <70 -continues on pravachol 40mg  daily - Lipid panel - Comprehensive metabolic panel  6. Phantom pain ([place]) -cont lyrica for this -uses the oxycodone more for the pain at his fistula and right hand after fistula removal  Labs/tests ordered:   Orders Placed This Encounter  Procedures  . Hemoglobin A1c  . Lipid panel    Order Specific Question:  Has the patient fasted?    Answer:  Yes  . CBC with Differential/Platelet  . Comprehensive metabolic panel    Order Specific Question:  Has the patient fasted?    Answer:  Yes  . Ambulatory referral to Home Health    Referral Priority:  Routine    Referral Type:  Home Health Care    Referral Reason:  Specialty Services Required    Requested Specialty:  [place]    Number of Visits Requested:  1    Next appt:  3 mos med mgt, fasting for labs   Latonga Ponder L. Lyn Deemer, D.O. [facility] [address], [place] [address] Cell Phone (Mon-Fri 8am-5pm):  [phone] On Call:  [phone] & follow prompts after 5pm & weekends Office Phone:  [phone] Office Fax:  [phone]

## 2015-04-11 ENCOUNTER — Encounter: Payer: Self-pay | Admitting: Internal Medicine

## 2015-04-11 DIAGNOSIS — N2581 Secondary hyperparathyroidism of renal origin: Secondary | ICD-10-CM | POA: Diagnosis not present

## 2015-04-11 DIAGNOSIS — E1122 Type 2 diabetes mellitus with diabetic chronic kidney disease: Secondary | ICD-10-CM | POA: Diagnosis not present

## 2015-04-11 DIAGNOSIS — Z992 Dependence on renal dialysis: Secondary | ICD-10-CM | POA: Diagnosis not present

## 2015-04-11 DIAGNOSIS — N186 End stage renal disease: Secondary | ICD-10-CM | POA: Diagnosis not present

## 2015-04-11 DIAGNOSIS — E119 Type 2 diabetes mellitus without complications: Secondary | ICD-10-CM | POA: Diagnosis not present

## 2015-04-11 LAB — LIPID PANEL
Chol/HDL Ratio: 7 ratio units — ABNORMAL HIGH (ref 0.0–5.0)
Cholesterol, Total: 237 mg/dL — ABNORMAL HIGH (ref 100–199)
HDL: 34 mg/dL — ABNORMAL LOW (ref >39)
LDL Calculated: 157 mg/dL — ABNORMAL HIGH (ref 0–99)
Triglycerides: 228 mg/dL — ABNORMAL HIGH (ref 0–149)
VLDL Cholesterol Cal: 46 mg/dL — ABNORMAL HIGH (ref 5–40)

## 2015-04-11 LAB — COMPREHENSIVE METABOLIC PANEL
ALT: 7 IU/L (ref 0–44)
AST: 12 IU/L (ref 0–40)
Albumin/Globulin Ratio: 1.5 (ref 1.2–2.2)
Albumin: 4.2 g/dL (ref 3.5–4.8)
Alkaline Phosphatase: 123 IU/L — ABNORMAL HIGH (ref 39–117)
BUN/Creatinine Ratio: 6 — ABNORMAL LOW (ref 10–22)
BUN: 26 mg/dL (ref 8–27)
Bilirubin Total: 0.3 mg/dL (ref 0.0–1.2)
CO2: 22 mmol/L (ref 18–29)
Calcium: 9.9 mg/dL (ref 8.6–10.2)
Chloride: 90 mmol/L — ABNORMAL LOW (ref 96–106)
Creatinine, Ser: 4.66 mg/dL — ABNORMAL HIGH (ref 0.76–1.27)
GFR calc Af Amer: 13 mL/min/{1.73_m2} — ABNORMAL LOW (ref >59)
GFR calc non Af Amer: 11 mL/min/{1.73_m2} — ABNORMAL LOW (ref >59)
Globulin, Total: 2.8 g/dL (ref 1.5–4.5)
Glucose: 519 mg/dL (ref 65–99)
Potassium: 4.9 mmol/L (ref 3.5–5.2)
Sodium: 134 mmol/L (ref 134–144)
Total Protein: 7 g/dL (ref 6.0–8.5)

## 2015-04-11 LAB — CBC WITH DIFFERENTIAL/PLATELET
Basophils Absolute: 0 10*3/uL (ref 0.0–0.2)
Basos: 1 %
EOS (ABSOLUTE): 0.1 10*3/uL (ref 0.0–0.4)
Eos: 1 %
Hematocrit: 38 % (ref 37.5–51.0)
Hemoglobin: 12.1 g/dL — ABNORMAL LOW (ref 12.6–17.7)
Immature Grans (Abs): 0 10*3/uL (ref 0.0–0.1)
Immature Granulocytes: 0 %
Lymphocytes Absolute: 1.9 10*3/uL (ref 0.7–3.1)
Lymphs: 30 %
MCH: 31 pg (ref 26.6–33.0)
MCHC: 31.8 g/dL (ref 31.5–35.7)
MCV: 97 fL (ref 79–97)
Monocytes Absolute: 0.4 10*3/uL (ref 0.1–0.9)
Monocytes: 6 %
Neutrophils Absolute: 3.9 10*3/uL (ref 1.4–7.0)
Neutrophils: 62 %
Platelets: 246 10*3/uL (ref 150–379)
RBC: 3.9 x10E6/uL — ABNORMAL LOW (ref 4.14–5.80)
RDW: 14.8 % (ref 12.3–15.4)
WBC: 6.4 10*3/uL (ref 3.4–10.8)

## 2015-04-11 LAB — HEMOGLOBIN A1C
Est. average glucose Bld gHb Est-mCnc: 306 mg/dL
Hgb A1c MFr Bld: 12.3 % — ABNORMAL HIGH (ref 4.8–5.6)

## 2015-04-14 DIAGNOSIS — E119 Type 2 diabetes mellitus without complications: Secondary | ICD-10-CM | POA: Diagnosis not present

## 2015-04-14 DIAGNOSIS — N186 End stage renal disease: Secondary | ICD-10-CM | POA: Diagnosis not present

## 2015-04-14 DIAGNOSIS — N2581 Secondary hyperparathyroidism of renal origin: Secondary | ICD-10-CM | POA: Diagnosis not present

## 2015-04-14 DIAGNOSIS — D631 Anemia in chronic kidney disease: Secondary | ICD-10-CM | POA: Diagnosis not present

## 2015-04-15 DIAGNOSIS — Z89512 Acquired absence of left leg below knee: Secondary | ICD-10-CM | POA: Diagnosis not present

## 2015-04-15 DIAGNOSIS — I1311 Hypertensive heart and chronic kidney disease without heart failure, with stage 5 chronic kidney disease, or end stage renal disease: Secondary | ICD-10-CM | POA: Diagnosis not present

## 2015-04-15 DIAGNOSIS — I2510 Atherosclerotic heart disease of native coronary artery without angina pectoris: Secondary | ICD-10-CM | POA: Diagnosis not present

## 2015-04-15 DIAGNOSIS — Z794 Long term (current) use of insulin: Secondary | ICD-10-CM | POA: Diagnosis not present

## 2015-04-15 DIAGNOSIS — Z7902 Long term (current) use of antithrombotics/antiplatelets: Secondary | ICD-10-CM | POA: Diagnosis not present

## 2015-04-15 DIAGNOSIS — F17210 Nicotine dependence, cigarettes, uncomplicated: Secondary | ICD-10-CM | POA: Diagnosis not present

## 2015-04-15 DIAGNOSIS — I252 Old myocardial infarction: Secondary | ICD-10-CM | POA: Diagnosis not present

## 2015-04-15 DIAGNOSIS — Z955 Presence of coronary angioplasty implant and graft: Secondary | ICD-10-CM | POA: Diagnosis not present

## 2015-04-15 DIAGNOSIS — Z992 Dependence on renal dialysis: Secondary | ICD-10-CM | POA: Diagnosis not present

## 2015-04-15 DIAGNOSIS — M6281 Muscle weakness (generalized): Secondary | ICD-10-CM | POA: Diagnosis not present

## 2015-04-15 DIAGNOSIS — I255 Ischemic cardiomyopathy: Secondary | ICD-10-CM | POA: Diagnosis not present

## 2015-04-15 DIAGNOSIS — E1165 Type 2 diabetes mellitus with hyperglycemia: Secondary | ICD-10-CM | POA: Diagnosis not present

## 2015-04-15 DIAGNOSIS — N186 End stage renal disease: Secondary | ICD-10-CM | POA: Diagnosis not present

## 2015-04-15 DIAGNOSIS — Z89511 Acquired absence of right leg below knee: Secondary | ICD-10-CM | POA: Diagnosis not present

## 2015-04-15 DIAGNOSIS — Z7982 Long term (current) use of aspirin: Secondary | ICD-10-CM | POA: Diagnosis not present

## 2015-04-15 DIAGNOSIS — E1122 Type 2 diabetes mellitus with diabetic chronic kidney disease: Secondary | ICD-10-CM | POA: Diagnosis not present

## 2015-04-15 DIAGNOSIS — I739 Peripheral vascular disease, unspecified: Secondary | ICD-10-CM | POA: Diagnosis not present

## 2015-04-16 DIAGNOSIS — N2581 Secondary hyperparathyroidism of renal origin: Secondary | ICD-10-CM | POA: Diagnosis not present

## 2015-04-16 DIAGNOSIS — D631 Anemia in chronic kidney disease: Secondary | ICD-10-CM | POA: Diagnosis not present

## 2015-04-16 DIAGNOSIS — E119 Type 2 diabetes mellitus without complications: Secondary | ICD-10-CM | POA: Diagnosis not present

## 2015-04-16 DIAGNOSIS — N186 End stage renal disease: Secondary | ICD-10-CM | POA: Diagnosis not present

## 2015-04-17 DIAGNOSIS — I1311 Hypertensive heart and chronic kidney disease without heart failure, with stage 5 chronic kidney disease, or end stage renal disease: Secondary | ICD-10-CM | POA: Diagnosis not present

## 2015-04-17 DIAGNOSIS — E1165 Type 2 diabetes mellitus with hyperglycemia: Secondary | ICD-10-CM | POA: Diagnosis not present

## 2015-04-17 DIAGNOSIS — N186 End stage renal disease: Secondary | ICD-10-CM | POA: Diagnosis not present

## 2015-04-17 DIAGNOSIS — I255 Ischemic cardiomyopathy: Secondary | ICD-10-CM | POA: Diagnosis not present

## 2015-04-17 DIAGNOSIS — E1122 Type 2 diabetes mellitus with diabetic chronic kidney disease: Secondary | ICD-10-CM | POA: Diagnosis not present

## 2015-04-17 DIAGNOSIS — M6281 Muscle weakness (generalized): Secondary | ICD-10-CM | POA: Diagnosis not present

## 2015-04-18 DIAGNOSIS — D631 Anemia in chronic kidney disease: Secondary | ICD-10-CM | POA: Diagnosis not present

## 2015-04-18 DIAGNOSIS — E119 Type 2 diabetes mellitus without complications: Secondary | ICD-10-CM | POA: Diagnosis not present

## 2015-04-18 DIAGNOSIS — N2581 Secondary hyperparathyroidism of renal origin: Secondary | ICD-10-CM | POA: Diagnosis not present

## 2015-04-18 DIAGNOSIS — N186 End stage renal disease: Secondary | ICD-10-CM | POA: Diagnosis not present

## 2015-04-22 ENCOUNTER — Telehealth: Payer: Self-pay | Admitting: *Deleted

## 2015-04-22 NOTE — Telephone Encounter (Signed)
Per our office policy, he needs to come in to see the CMA for vitals and to provide a urine sample.  I don't just call in antibiotics.

## 2015-04-22 NOTE — Telephone Encounter (Signed)
Patient's wife called stating that he has a UTI and would like something sent to [facility]. Please Advise!

## 2015-04-22 NOTE — Telephone Encounter (Signed)
Spoke with Dr. Mariea Clonts regarding this matter to ask if it was all right with her to have the home health nurse [name]) go out to get the urine sample she stated that would be fine with her. I then call the patient wife so that she could inform her husband that th home health nurse would be coming out to get the sample to take to the lab. Urine sample should arrive at [facility] this afternoon.

## 2015-04-23 DIAGNOSIS — I255 Ischemic cardiomyopathy: Secondary | ICD-10-CM | POA: Diagnosis not present

## 2015-04-23 DIAGNOSIS — E1122 Type 2 diabetes mellitus with diabetic chronic kidney disease: Secondary | ICD-10-CM | POA: Diagnosis not present

## 2015-04-23 DIAGNOSIS — E1165 Type 2 diabetes mellitus with hyperglycemia: Secondary | ICD-10-CM | POA: Diagnosis not present

## 2015-04-23 DIAGNOSIS — N186 End stage renal disease: Secondary | ICD-10-CM | POA: Diagnosis not present

## 2015-04-23 DIAGNOSIS — N390 Urinary tract infection, site not specified: Secondary | ICD-10-CM | POA: Diagnosis not present

## 2015-04-23 DIAGNOSIS — M6281 Muscle weakness (generalized): Secondary | ICD-10-CM | POA: Diagnosis not present

## 2015-04-23 DIAGNOSIS — I1311 Hypertensive heart and chronic kidney disease without heart failure, with stage 5 chronic kidney disease, or end stage renal disease: Secondary | ICD-10-CM | POA: Diagnosis not present

## 2015-04-24 ENCOUNTER — Telehealth: Payer: Self-pay | Admitting: *Deleted

## 2015-04-24 DIAGNOSIS — D631 Anemia in chronic kidney disease: Secondary | ICD-10-CM | POA: Diagnosis not present

## 2015-04-24 DIAGNOSIS — N186 End stage renal disease: Secondary | ICD-10-CM | POA: Diagnosis not present

## 2015-04-24 DIAGNOSIS — N2581 Secondary hyperparathyroidism of renal origin: Secondary | ICD-10-CM | POA: Diagnosis not present

## 2015-04-24 DIAGNOSIS — E119 Type 2 diabetes mellitus without complications: Secondary | ICD-10-CM | POA: Diagnosis not present

## 2015-04-24 NOTE — Telephone Encounter (Signed)
Spoke with [name] this morning regarding Aaron Long not going to dialysis all week, she recommend that a Education officer, museum go out to talk with the family as well to assess the needs of the family. [name] stated that she will speak with Aaron Long regarding him not going to dialysis, to see if there is a problem.

## 2015-04-25 DIAGNOSIS — N186 End stage renal disease: Secondary | ICD-10-CM | POA: Diagnosis not present

## 2015-04-25 DIAGNOSIS — D631 Anemia in chronic kidney disease: Secondary | ICD-10-CM | POA: Diagnosis not present

## 2015-04-25 DIAGNOSIS — N2581 Secondary hyperparathyroidism of renal origin: Secondary | ICD-10-CM | POA: Diagnosis not present

## 2015-04-25 DIAGNOSIS — E119 Type 2 diabetes mellitus without complications: Secondary | ICD-10-CM | POA: Diagnosis not present

## 2015-04-28 ENCOUNTER — Other Ambulatory Visit: Payer: Self-pay | Admitting: *Deleted

## 2015-04-28 DIAGNOSIS — Z992 Dependence on renal dialysis: Secondary | ICD-10-CM

## 2015-04-28 DIAGNOSIS — E785 Hyperlipidemia, unspecified: Secondary | ICD-10-CM

## 2015-04-28 DIAGNOSIS — E1122 Type 2 diabetes mellitus with diabetic chronic kidney disease: Secondary | ICD-10-CM

## 2015-04-28 DIAGNOSIS — E119 Type 2 diabetes mellitus without complications: Secondary | ICD-10-CM | POA: Diagnosis not present

## 2015-04-28 DIAGNOSIS — N2581 Secondary hyperparathyroidism of renal origin: Secondary | ICD-10-CM | POA: Diagnosis not present

## 2015-04-28 DIAGNOSIS — IMO0002 Reserved for concepts with insufficient information to code with codable children: Secondary | ICD-10-CM

## 2015-04-28 DIAGNOSIS — N400 Enlarged prostate without lower urinary tract symptoms: Secondary | ICD-10-CM

## 2015-04-28 DIAGNOSIS — Z794 Long term (current) use of insulin: Secondary | ICD-10-CM

## 2015-04-28 DIAGNOSIS — E1165 Type 2 diabetes mellitus with hyperglycemia: Secondary | ICD-10-CM

## 2015-04-28 DIAGNOSIS — I10 Essential (primary) hypertension: Secondary | ICD-10-CM

## 2015-04-28 DIAGNOSIS — D631 Anemia in chronic kidney disease: Secondary | ICD-10-CM | POA: Diagnosis not present

## 2015-04-28 DIAGNOSIS — N186 End stage renal disease: Secondary | ICD-10-CM

## 2015-04-28 DIAGNOSIS — I2510 Atherosclerotic heart disease of native coronary artery without angina pectoris: Secondary | ICD-10-CM

## 2015-04-28 MED ORDER — INSULIN ASPART 100 UNIT/ML FLEXPEN: 5.0000 [IU] | PEN_INJECTOR | Freq: Three times a day (TID) | SUBCUTANEOUS | Status: DC

## 2015-04-28 MED ORDER — PRAVASTATIN SODIUM 40 MG PO TABS: 40.0000 mg | ORAL_TABLET | Freq: Every day | ORAL | Status: DC

## 2015-04-28 MED ORDER — AMBULATORY NON FORMULARY MEDICATION: Status: DC

## 2015-04-28 MED ORDER — CLOPIDOGREL BISULFATE 75 MG PO TABS: 75.0000 mg | ORAL_TABLET | Freq: Every day | ORAL | Status: DC

## 2015-04-28 MED ORDER — AMLODIPINE BESYLATE 10 MG PO TABS: 10.0000 mg | ORAL_TABLET | Freq: Every day | ORAL | Status: DC

## 2015-04-28 MED ORDER — QUETIAPINE FUMARATE 25 MG PO TABS: 25.0000 mg | ORAL_TABLET | Freq: Every day | ORAL | Status: DC

## 2015-04-28 MED ORDER — ASPIRIN EC 81 MG PO TBEC: 81.0000 mg | DELAYED_RELEASE_TABLET | Freq: Every morning | ORAL | Status: DC

## 2015-04-28 MED ORDER — TAMSULOSIN HCL 0.4 MG PO CAPS: 0.4000 mg | ORAL_CAPSULE | Freq: Every day | ORAL | Status: DC

## 2015-04-28 MED ORDER — INSULIN DETEMIR 100 UNIT/ML FLEXPEN: 17.0000 [IU] | PEN_INJECTOR | Freq: Two times a day (BID) | SUBCUTANEOUS | Status: DC

## 2015-04-28 NOTE — Telephone Encounter (Signed)
Received message from Aaron Long, patient needs refills faxed to pharmacy. Faxed.

## 2015-04-29 ENCOUNTER — Telehealth: Payer: Self-pay

## 2015-04-29 DIAGNOSIS — E1165 Type 2 diabetes mellitus with hyperglycemia: Secondary | ICD-10-CM | POA: Diagnosis not present

## 2015-04-29 DIAGNOSIS — I1311 Hypertensive heart and chronic kidney disease without heart failure, with stage 5 chronic kidney disease, or end stage renal disease: Secondary | ICD-10-CM | POA: Diagnosis not present

## 2015-04-29 DIAGNOSIS — I255 Ischemic cardiomyopathy: Secondary | ICD-10-CM | POA: Diagnosis not present

## 2015-04-29 DIAGNOSIS — E1122 Type 2 diabetes mellitus with diabetic chronic kidney disease: Secondary | ICD-10-CM | POA: Diagnosis not present

## 2015-04-29 DIAGNOSIS — N186 End stage renal disease: Secondary | ICD-10-CM | POA: Diagnosis not present

## 2015-04-29 DIAGNOSIS — M6281 Muscle weakness (generalized): Secondary | ICD-10-CM | POA: Diagnosis not present

## 2015-04-29 NOTE — Telephone Encounter (Signed)
Per faxed lab results from [facility] had no growth.  Spoke with Mrs.Aguayo about results. Mrs Waitkus verbalized understanding of results. Mrs Gaetani also questioned if we requested skilled nursing services for her husband through [facility]. I informed her per Referral order placed on 04-10-15 Dr.Reed noted the need for skilled nursing. Mrs.Stude was instructed to further follow-up with [facility] for they will be seeing the patient today for therapy.

## 2015-04-30 DIAGNOSIS — N2581 Secondary hyperparathyroidism of renal origin: Secondary | ICD-10-CM | POA: Diagnosis not present

## 2015-04-30 DIAGNOSIS — N186 End stage renal disease: Secondary | ICD-10-CM | POA: Diagnosis not present

## 2015-04-30 DIAGNOSIS — D631 Anemia in chronic kidney disease: Secondary | ICD-10-CM | POA: Diagnosis not present

## 2015-04-30 DIAGNOSIS — E119 Type 2 diabetes mellitus without complications: Secondary | ICD-10-CM | POA: Diagnosis not present

## 2015-05-01 ENCOUNTER — Inpatient Hospital Stay (HOSPITAL_COMMUNITY)
Admission: EM | Admit: 2015-05-01 | Discharge: 2015-05-05 | DRG: 393 | Disposition: A | Discharge status: Home Health | Payer: Medicare Other | Attending: Internal Medicine | Admitting: Internal Medicine

## 2015-05-01 ENCOUNTER — Encounter (HOSPITAL_COMMUNITY): Payer: Self-pay | Admitting: *Deleted

## 2015-05-01 DIAGNOSIS — D62 Acute posthemorrhagic anemia: Secondary | ICD-10-CM | POA: Diagnosis not present

## 2015-05-01 DIAGNOSIS — E1129 Type 2 diabetes mellitus with other diabetic kidney complication: Secondary | ICD-10-CM | POA: Diagnosis present

## 2015-05-01 DIAGNOSIS — Z89512 Acquired absence of left leg below knee: Secondary | ICD-10-CM

## 2015-05-01 DIAGNOSIS — K922 Gastrointestinal hemorrhage, unspecified: Secondary | ICD-10-CM | POA: Diagnosis not present

## 2015-05-01 DIAGNOSIS — N186 End stage renal disease: Secondary | ICD-10-CM | POA: Diagnosis present

## 2015-05-01 DIAGNOSIS — I255 Ischemic cardiomyopathy: Secondary | ICD-10-CM | POA: Diagnosis present

## 2015-05-01 DIAGNOSIS — E1151 Type 2 diabetes mellitus with diabetic peripheral angiopathy without gangrene: Secondary | ICD-10-CM | POA: Diagnosis present

## 2015-05-01 DIAGNOSIS — Z841 Family history of disorders of kidney and ureter: Secondary | ICD-10-CM

## 2015-05-01 DIAGNOSIS — E785 Hyperlipidemia, unspecified: Secondary | ICD-10-CM | POA: Diagnosis present

## 2015-05-01 DIAGNOSIS — Z86718 Personal history of other venous thrombosis and embolism: Secondary | ICD-10-CM | POA: Diagnosis not present

## 2015-05-01 DIAGNOSIS — Z7982 Long term (current) use of aspirin: Secondary | ICD-10-CM

## 2015-05-01 DIAGNOSIS — Z992 Dependence on renal dialysis: Secondary | ICD-10-CM

## 2015-05-01 DIAGNOSIS — Z809 Family history of malignant neoplasm, unspecified: Secondary | ICD-10-CM

## 2015-05-01 DIAGNOSIS — Z951 Presence of aortocoronary bypass graft: Secondary | ICD-10-CM | POA: Diagnosis not present

## 2015-05-01 DIAGNOSIS — Z993 Dependence on wheelchair: Secondary | ICD-10-CM | POA: Diagnosis not present

## 2015-05-01 DIAGNOSIS — N189 Chronic kidney disease, unspecified: Secondary | ICD-10-CM | POA: Diagnosis not present

## 2015-05-01 DIAGNOSIS — Z794 Long term (current) use of insulin: Secondary | ICD-10-CM

## 2015-05-01 DIAGNOSIS — K2901 Acute gastritis with bleeding: Secondary | ICD-10-CM | POA: Diagnosis not present

## 2015-05-01 DIAGNOSIS — Z8546 Personal history of malignant neoplasm of prostate: Secondary | ICD-10-CM | POA: Diagnosis not present

## 2015-05-01 DIAGNOSIS — D631 Anemia in chronic kidney disease: Secondary | ICD-10-CM | POA: Diagnosis present

## 2015-05-01 DIAGNOSIS — Z87891 Personal history of nicotine dependence: Secondary | ICD-10-CM

## 2015-05-01 DIAGNOSIS — K6381 Dieulafoy lesion of intestine: Secondary | ICD-10-CM | POA: Diagnosis present

## 2015-05-01 DIAGNOSIS — Z86010 Personal history of colonic polyps: Secondary | ICD-10-CM | POA: Diagnosis not present

## 2015-05-01 DIAGNOSIS — R531 Weakness: Secondary | ICD-10-CM | POA: Diagnosis not present

## 2015-05-01 DIAGNOSIS — I252 Old myocardial infarction: Secondary | ICD-10-CM

## 2015-05-01 DIAGNOSIS — K219 Gastro-esophageal reflux disease without esophagitis: Secondary | ICD-10-CM | POA: Diagnosis present

## 2015-05-01 DIAGNOSIS — I739 Peripheral vascular disease, unspecified: Secondary | ICD-10-CM | POA: Diagnosis not present

## 2015-05-01 DIAGNOSIS — R197 Diarrhea, unspecified: Secondary | ICD-10-CM | POA: Diagnosis not present

## 2015-05-01 DIAGNOSIS — I132 Hypertensive heart and chronic kidney disease with heart failure and with stage 5 chronic kidney disease, or end stage renal disease: Secondary | ICD-10-CM | POA: Diagnosis present

## 2015-05-01 DIAGNOSIS — F329 Major depressive disorder, single episode, unspecified: Secondary | ICD-10-CM | POA: Diagnosis present

## 2015-05-01 DIAGNOSIS — E1165 Type 2 diabetes mellitus with hyperglycemia: Secondary | ICD-10-CM | POA: Diagnosis present

## 2015-05-01 DIAGNOSIS — Z820 Family history of epilepsy and other diseases of the nervous system: Secondary | ICD-10-CM

## 2015-05-01 DIAGNOSIS — Z87442 Personal history of urinary calculi: Secondary | ICD-10-CM | POA: Diagnosis not present

## 2015-05-01 DIAGNOSIS — E1122 Type 2 diabetes mellitus with diabetic chronic kidney disease: Secondary | ICD-10-CM | POA: Diagnosis present

## 2015-05-01 DIAGNOSIS — Z8249 Family history of ischemic heart disease and other diseases of the circulatory system: Secondary | ICD-10-CM | POA: Diagnosis not present

## 2015-05-01 DIAGNOSIS — I120 Hypertensive chronic kidney disease with stage 5 chronic kidney disease or end stage renal disease: Secondary | ICD-10-CM | POA: Diagnosis not present

## 2015-05-01 DIAGNOSIS — Z8673 Personal history of transient ischemic attack (TIA), and cerebral infarction without residual deficits: Secondary | ICD-10-CM

## 2015-05-01 DIAGNOSIS — I129 Hypertensive chronic kidney disease with stage 1 through stage 4 chronic kidney disease, or unspecified chronic kidney disease: Secondary | ICD-10-CM | POA: Diagnosis not present

## 2015-05-01 DIAGNOSIS — I5032 Chronic diastolic (congestive) heart failure: Secondary | ICD-10-CM | POA: Diagnosis present

## 2015-05-01 DIAGNOSIS — I2510 Atherosclerotic heart disease of native coronary artery without angina pectoris: Secondary | ICD-10-CM | POA: Diagnosis present

## 2015-05-01 DIAGNOSIS — E1142 Type 2 diabetes mellitus with diabetic polyneuropathy: Secondary | ICD-10-CM | POA: Diagnosis present

## 2015-05-01 DIAGNOSIS — Z89511 Acquired absence of right leg below knee: Secondary | ICD-10-CM

## 2015-05-01 DIAGNOSIS — K625 Hemorrhage of anus and rectum: Secondary | ICD-10-CM | POA: Diagnosis present

## 2015-05-01 DIAGNOSIS — Z79899 Other long term (current) drug therapy: Secondary | ICD-10-CM

## 2015-05-01 DIAGNOSIS — R404 Transient alteration of awareness: Secondary | ICD-10-CM | POA: Diagnosis not present

## 2015-05-01 DIAGNOSIS — IMO0002 Reserved for concepts with insufficient information to code with codable children: Secondary | ICD-10-CM | POA: Diagnosis present

## 2015-05-01 DIAGNOSIS — I10 Essential (primary) hypertension: Secondary | ICD-10-CM | POA: Diagnosis present

## 2015-05-01 DIAGNOSIS — N2581 Secondary hyperparathyroidism of renal origin: Secondary | ICD-10-CM | POA: Diagnosis not present

## 2015-05-01 HISTORY — DX: Dieulafoy lesion of intestine: K63.81

## 2015-05-01 LAB — HEMOGLOBIN AND HEMATOCRIT, BLOOD
HEMATOCRIT: 28.8 % — AB (ref 39.0–52.0)
HEMOGLOBIN: 9.2 g/dL — AB (ref 13.0–17.0)

## 2015-05-01 LAB — CBC WITH DIFFERENTIAL/PLATELET
BASOS ABS: 0 10*3/uL (ref 0.0–0.1)
BASOS PCT: 0 %
EOS ABS: 0.1 10*3/uL (ref 0.0–0.7)
EOS PCT: 1 %
HCT: 24.6 % — ABNORMAL LOW (ref 39.0–52.0)
Hemoglobin: 7.9 g/dL — ABNORMAL LOW (ref 13.0–17.0)
LYMPHS ABS: 2 10*3/uL (ref 0.7–4.0)
Lymphocytes Relative: 22 %
MCH: 30.7 pg (ref 26.0–34.0)
MCHC: 32.1 g/dL (ref 30.0–36.0)
MCV: 95.7 fL (ref 78.0–100.0)
Monocytes Absolute: 0.4 10*3/uL (ref 0.1–1.0)
Monocytes Relative: 5 %
Neutro Abs: 6.6 10*3/uL (ref 1.7–7.7)
Neutrophils Relative %: 72 %
PLATELETS: 163 10*3/uL (ref 150–400)
RBC: 2.57 MIL/uL — AB (ref 4.22–5.81)
RDW: 14 % (ref 11.5–15.5)
WBC: 9.2 10*3/uL (ref 4.0–10.5)

## 2015-05-01 LAB — COMPREHENSIVE METABOLIC PANEL
ALK PHOS: 81 U/L (ref 38–126)
ALT: 10 U/L — AB (ref 17–63)
AST: 15 U/L (ref 15–41)
Albumin: 3.1 g/dL — ABNORMAL LOW (ref 3.5–5.0)
Anion gap: 16 — ABNORMAL HIGH (ref 5–15)
BILIRUBIN TOTAL: 0.5 mg/dL (ref 0.3–1.2)
BUN: 35 mg/dL — AB (ref 6–20)
CALCIUM: 9.1 mg/dL (ref 8.9–10.3)
CO2: 26 mmol/L (ref 22–32)
CREATININE: 5.4 mg/dL — AB (ref 0.61–1.24)
Chloride: 96 mmol/L — ABNORMAL LOW (ref 101–111)
GFR, EST AFRICAN AMERICAN: 11 mL/min — AB (ref >60)
GFR, EST NON AFRICAN AMERICAN: 9 mL/min — AB (ref >60)
Glucose, Bld: 380 mg/dL — ABNORMAL HIGH (ref 65–99)
Potassium: 4.9 mmol/L (ref 3.5–5.1)
Sodium: 138 mmol/L (ref 135–145)
TOTAL PROTEIN: 6.3 g/dL — AB (ref 6.5–8.1)

## 2015-05-01 LAB — PREPARE RBC (CROSSMATCH)

## 2015-05-01 LAB — POC OCCULT BLOOD, ED: FECAL OCCULT BLD: POSITIVE — AB

## 2015-05-01 LAB — APTT: APTT: 22 s — AB (ref 24–37)

## 2015-05-01 LAB — PROTIME-INR
INR: 1.06 (ref 0.00–1.49)
PROTHROMBIN TIME: 14 s (ref 11.6–15.2)

## 2015-05-01 LAB — MRSA PCR SCREENING: MRSA BY PCR: NEGATIVE

## 2015-05-01 LAB — GLUCOSE, CAPILLARY: Glucose-Capillary: 283 mg/dL — ABNORMAL HIGH (ref 65–99)

## 2015-05-01 MED ORDER — ONDANSETRON HCL 4 MG/2ML IJ SOLN: 4.0000 mg | Freq: Four times a day (QID) | INTRAMUSCULAR | Status: DC | PRN

## 2015-05-01 MED ORDER — PREGABALIN 75 MG PO CAPS
75.0000 mg | ORAL_CAPSULE | Freq: Every day | ORAL | Status: DC
  Administered 2015-05-02 – 2015-05-05 (×4): 75 mg via ORAL
  Filled 2015-05-01 (×4): qty 1

## 2015-05-01 MED ORDER — TAMSULOSIN HCL 0.4 MG PO CAPS
0.4000 mg | ORAL_CAPSULE | Freq: Every day | ORAL | Status: DC
  Administered 2015-05-02 – 2015-05-04 (×3): 0.4 mg via ORAL
  Filled 2015-05-01 (×3): qty 1

## 2015-05-01 MED ORDER — HYDROCODONE-ACETAMINOPHEN 5-325 MG PO TABS: 1.0000 | ORAL_TABLET | Freq: Four times a day (QID) | ORAL | Status: DC | PRN

## 2015-05-01 MED ORDER — INSULIN DETEMIR 100 UNIT/ML ~~LOC~~ SOLN
10.0000 [IU] | Freq: Two times a day (BID) | SUBCUTANEOUS | Status: DC
  Administered 2015-05-01: 10 [IU] via SUBCUTANEOUS
  Filled 2015-05-01 (×2): qty 0.1

## 2015-05-01 MED ORDER — INSULIN ASPART 100 UNIT/ML ~~LOC~~ SOLN
0.0000 [IU] | SUBCUTANEOUS | Status: DC
  Administered 2015-05-01: 8 [IU] via SUBCUTANEOUS
  Administered 2015-05-02: 3 [IU] via SUBCUTANEOUS
  Administered 2015-05-02: 5 [IU] via SUBCUTANEOUS
  Administered 2015-05-02: 3 [IU] via SUBCUTANEOUS
  Administered 2015-05-03 (×2): 2 [IU] via SUBCUTANEOUS
  Administered 2015-05-04: 5 [IU] via SUBCUTANEOUS

## 2015-05-01 MED ORDER — AMLODIPINE BESYLATE 10 MG PO TABS
10.0000 mg | ORAL_TABLET | Freq: Every day | ORAL | Status: DC
  Administered 2015-05-02 – 2015-05-05 (×4): 10 mg via ORAL
  Filled 2015-05-01 (×4): qty 1

## 2015-05-01 MED ORDER — SODIUM CHLORIDE 0.9 % IV SOLN
Freq: Once | INTRAVENOUS | Status: AC
  Administered 2015-05-01: 18:00:00 via INTRAVENOUS

## 2015-05-01 MED ORDER — PANTOPRAZOLE SODIUM 40 MG IV SOLR
40.0000 mg | Freq: Two times a day (BID) | INTRAVENOUS | Status: DC
  Administered 2015-05-01 – 2015-05-04 (×7): 40 mg via INTRAVENOUS
  Filled 2015-05-01 (×6): qty 40

## 2015-05-01 MED ORDER — ACETAMINOPHEN 650 MG RE SUPP: 650.0000 mg | Freq: Four times a day (QID) | RECTAL | Status: DC | PRN

## 2015-05-01 MED ORDER — CALCIUM ACETATE (PHOS BINDER) 667 MG PO CAPS: 667.0000 mg | ORAL_CAPSULE | Freq: Three times a day (TID) | ORAL | Status: DC

## 2015-05-01 MED ORDER — SODIUM CHLORIDE 0.9% FLUSH
3.0000 mL | Freq: Two times a day (BID) | INTRAVENOUS | Status: DC
  Administered 2015-05-02 – 2015-05-04 (×4): 3 mL via INTRAVENOUS

## 2015-05-01 MED ORDER — ONDANSETRON HCL 4 MG PO TABS: 4.0000 mg | ORAL_TABLET | Freq: Four times a day (QID) | ORAL | Status: DC | PRN

## 2015-05-01 MED ORDER — PRAVASTATIN SODIUM 40 MG PO TABS
40.0000 mg | ORAL_TABLET | Freq: Every day | ORAL | Status: DC
  Administered 2015-05-02 – 2015-05-05 (×4): 40 mg via ORAL
  Filled 2015-05-01 (×4): qty 1

## 2015-05-01 MED ORDER — ACETAMINOPHEN 325 MG PO TABS: 650.0000 mg | ORAL_TABLET | Freq: Four times a day (QID) | ORAL | Status: DC | PRN

## 2015-05-01 NOTE — Progress Notes (Signed)
Report received from [facility] ED RN [phone] transfer in from ED via stretcher  .Pt with blood  Transfusion in progress no signs of c c omplications . No signs of bleeding .Kept comfortable in bed . Clear liquid given as ordered   Resting comfortably in bed. Endorsed to 7 p-7a RN

## 2015-05-01 NOTE — ED Notes (Signed)
Pt arrives from home via [facility]. Pt states he has been having bloody diarrhea x3 days. Pt denies abdominal pain at this time. Pt receives dialysis MWF and last had dialysis on Wednesday.

## 2015-05-01 NOTE — ED Notes (Signed)
Attempted report. Unable to take at this time.

## 2015-05-01 NOTE — ED Notes (Signed)
Attempted report x 2 

## 2015-05-01 NOTE — H&P (Addendum)
TRH H&P   Patient Demographics:    Aaron Long, is a 75 y.o. male  MRN: [id number]   DOB - October 17, 1940  Admit Date - 05/01/2015  Outpatient Primary MD for the patient is [name], NP  Referring MD/NP/PA: Marye Round  Outpatient Specialists: Nephrology  Patient coming from: Home  Chief Complaint  Patient presents with  . Blood In Stools      HPI:    Aaron Long  is a 75 y.o. male, With a history of end-stage renal disease on dialysis (M, W, F), coronary artery disease,  uncontrolled type 2 diabetes mellitus (A1c >12), diabetic neuropathy and peripheral vascular disease with bilateral BKAs, wheelchair-bound , history of CHF (last EF from September 2016 of 50-50% with grade 1 diastolic dysfunction), CAD with history of NSTEMI, s/p CABG in 2007, history of TIA who presented to the ED with 2 day history of bleeding per rectum. Patient reports noticing bright red blood mixed with stool 2 days back. His daughter thought he may have had hemorrhoids and got him some tucks. Whatever symptoms persisted and patient had 2 similar episodes of bright red blood mixed with stool yesterday. He had further 2 episodes today and family also noticed significant amount of bright red blood in his clothes and in the wheelchair. Patient denies any headache, dizziness, blurred vision, fevers, chills, chest pain, palpitations, shortness of breath, nausea, vomiting, hematemesis, abdominal pain or diarrhea. Denies use of NSAIDs. He is on aspirin and Plavix. Denies prior history of GI bleed and his last EGD/colonoscopy was several years ago.  Course in the ED Vitals were stable. Blood work showed hemoglobin of 7.9 (significant drop from baseline of 12-13). Chemistry showed BUN of 35 and creatinine of 5.4, glucose of 380. Stool for occult blood was positive. Insulin ordered and hospitalist  admission requested to telemetry. [facility] consulted.     Review of systems:    In addition to the HPI above, Blood in stool  No Fever-chills, No Headache, No changes with Vision or hearing, No problems swallowing food or Liquids, No Chest pain, Cough or Shortness of Breath, No Abdominal pain, No Nausea or Vomiting, no diarrhea No dysuria or hematuria No new skin rashes or bruises, No new joints pains-aches,  No new weakness, tingling, numbness in any extremity, No recent weight gain or loss, Patient anuric, no polyphagia, No significant Mental Stressors.     With Past History of the following :    Past Medical History  Diagnosis Date  . PAD (peripheral artery disease) ([place])     a. R CEA 2007. b. Hx L BKA in 2013. c. s/p LE angioplasty in 2014. d. Hx R BKA in 2014.  . Gangrene of toe (HCC)     dry  . Neuromuscular disorder ([place])     diabetic neuropathy  . Hypertension   . Hyperlipidemia   .  Peripheral neuropathy ([place])   . Constipation     takes Miralax daily  . Hemorrhoids   . Hx of colonic polyps   . Coronary artery disease     a. s/p NSTEMI/CABGx4 in 2007 - LIMA-LAD, SVG-optional diagonal, SVVG-OM, SVG-dRCA. b. Nuc 05/2011: nonischemic.  [name] BPH (benign prostatic hypertrophy)   . Abnormality of gait   . DVT (deep venous thrombosis) ([place])     a. Lower extremity DVT in 2013.  [name] Hypertrophy of prostate without urinary obstruction and other lower urinary tract symptoms (LUTS)   . Disorder of bone and cartilage, unspecified   . Lower limb amputation, below knee   . Anemia   . Secondary hyperparathyroidism ([place])     Secondary Hyperpara- Thyroidism, Renal  . Ischemic cardiomyopathy     a. EF 40% in 2007. b. 51% by nuc 05/2011.  [name] Complication of anesthesia     "he gets delirious"  . Myocardial infarction ([place]) 2005  . Type II diabetes mellitus ([place])   . Diabetic peripheral neuropathy ([place])   . Prostate cancer ([place]) 2010  . ESRD on dialysis [facility])     M-W-F  .  Kidney stones       Past Surgical History  Procedure Laterality Date  . Prostatectomy  2009  . Knee cartilage surgery Left 1964  . Coronary artery bypass graft  2005    CABG X4  . Carotid endarterectomy Right 2005  . Cataract extraction w/ intraocular lens  implant, bilateral Bilateral 2011  . Colonoscopy    . Cardiac catheterization  05/28/11  . Amputation  06/14/2011    Procedure: AMPUTATION DIGIT;  Surgeon: Elam Dutch, MD;  Location: [facility] OR;  Service: Vascular;  Laterality: Left;  Amputation Left fifth toe  . Amputation  06/16/2011    Procedure: AMPUTATION BELOW KNEE;  Surgeon: Elam Dutch, MD;  Location: [place];  Service: Vascular;  Laterality: Left;  . I&d extremity  01/31/2012    Procedure: IRRIGATION AND DEBRIDEMENT EXTREMITY;  Surgeon: Elam Dutch, MD;  Location: [place];  Service: Vascular;  Laterality: Left;  I & D Left BKA   . Amputation Right 06/12/2012    Procedure: AMPUTATION DIGIT;  Surgeon: Elam Dutch, MD;  Location: [facility] OR;  Service: Vascular;  Laterality: Right;  GREAT TOE  . Amputation Right 08/08/2012    Procedure: AMPUTATION BELOW KNEE;  Surgeon: Elam Dutch, MD;  Location: [facility] OR;  Service: Vascular;  Laterality: Right;  . Abdominal aortagram N/A 05/28/2011    Procedure: ABDOMINAL Maxcine Ham;  Surgeon: Elam Dutch, MD;  Location: [facility] CATH LAB;  Service: Cardiovascular;  Laterality: N/A;  . Abdominal aortagram N/A 06/02/2012    Procedure: ABDOMINAL Maxcine [place];  Surgeon: Elam Dutch, MD;  Location: [facility] CATH LAB;  Service: Cardiovascular;  Laterality: N/A;  . Abdominal aortagram N/A 06/09/2012    Procedure: ABDOMINAL Maxcine [place];  Surgeon: Elam Dutch, MD;  Location: [facility] CATH LAB;  Service: Cardiovascular;  Laterality: N/A;  . Insertion of dialysis catheter Right 04/19/2014    Procedure: INSERTION OF DIALYSIS CATHETER-INTERNAL JUGULAR;  Surgeon: Mal Misty, MD;  Location: [place];  Service: Vascular;  Laterality: Right;  . Av fistula placement Right  04/19/2014    Procedure: RIGHT ARM ARTERIOVENOUS (AV) FISTULA CREATION;  Surgeon: Mal Misty, MD;  Location: [place];  Service: Vascular;  Laterality: Right;  . Ligation of competing branches of arteriovenous fistula Right 06/19/2014    Procedure: Right Arm LIGATION OF COMPETING BRANCHES OF  RADIOCEPHALIC ARTERIOVENOUS FISTULA;  Surgeon: Mal Misty, MD;  Location: [place];  Service: Vascular;  Laterality: Right;  . Cardiac catheterization N/A 10/04/2014    Procedure: Left Heart Cath and Coronary Angiography;  Surgeon: Sherren Mocha, MD;  Location: [place] CV LAB;  Service: Cardiovascular;  Laterality: N/A;  . Endarterectomy Left 10/17/2014    Procedure: ENDARTERECTOMY CAROTID;  Surgeon: Mal Misty, MD;  Location: [place];  Service: Vascular;  Laterality: Left;  . Ligation of arteriovenous  fistula Right 11/06/2014    Procedure: LIGATION OF RADIOCEPHALIC ARTERIOVENOUS FISTULA;  Surgeon: Mal Misty, MD;  Location: [place];  Service: Vascular;  Laterality: Right;  . Insertion of dialysis catheter N/A 11/06/2014    Procedure: INSERTION OF 23cm DIALYSIS CATHETER - right internal jugular;  Surgeon: Mal Misty, MD;  Location: [place];  Service: Vascular;  Laterality: N/A;  . Av fistula placement Left 12/12/2014    Procedure: BRACHIOCEPHALIC ARTERIOVENOUS (AV) FISTULA CREATION;  Surgeon: Mal Misty, MD;  Location: [facility] OR;  Service: Vascular;  Laterality: Left;      Social History:     Social History  Substance Use Topics  . Smoking status: Former Smoker -- 1.00 packs/day for 30 years    Types: Cigarettes    Quit date: 04/28/1991  . Smokeless tobacco: Never Used  . Alcohol Use: Yes     Comment: 04/17/2014 "might have a glass of wine at Christmas"     Lives - At home with his wife  Mobility - wheelchair-bound     Family History :     Family History  Problem Relation Age of Onset  . Coronary artery disease Neg Hx   . Anesthesia problems Neg Hx   . Hypotension Neg Hx   .  Malignant hyperthermia Neg Hx   . Pseudochol deficiency Neg Hx   . Hyperlipidemia Mother   . Hypertension Mother   . Cancer Mother   . Hyperlipidemia Father   . Hypertension Father   . Kidney disease Father   . Heart disease Sister   . Alzheimer's disease Sister   . Cancer Brother   . Other Sister       Home Medications:   Prior to Admission medications   Medication Sig Start Date End Date Taking? Authorizing Provider  amLODipine (NORVASC) 10 MG tablet Take 1 tablet (10 mg total) by mouth daily. 04/28/15  Yes Tiffany L Reed, DO  aspirin EC 81 MG tablet Take 1 tablet (81 mg total) by mouth every morning. 04/28/15  Yes Tiffany L Reed, DO  calcium acetate (PHOSLO) 667 MG capsule Take 1 capsule (667 mg total) by mouth 3 (three) times daily with meals. 09/14/14  Yes Florencia Reasons, MD  clopidogrel (PLAVIX) 75 MG tablet Take 1 tablet (75 mg total) by mouth daily. 04/28/15  Yes Tiffany L Reed, DO  HYDROcodone-acetaminophen (NORCO/VICODIN) 5-325 MG tablet Take 1 tablet by mouth every 6 (six) hours as needed for moderate pain. 04/10/15  Yes Tiffany L Reed, DO  insulin aspart (NOVOLOG) 100 UNIT/ML FlexPen Inject 5 Units into the skin 3 (three) times daily with meals. Inject 5 units three times a day with meals if blood sugar greater than 150 only, DX: 250.71 04/28/15  Yes Tiffany L Reed, DO  Insulin Detemir (LEVEMIR) 100 UNIT/ML Pen Inject 17 Units into the skin 2 (two) times daily. 04/28/15  Yes Tiffany L Reed, DO  pravastatin (PRAVACHOL) 40 MG tablet Take 1 tablet (40 mg total) by mouth daily. 04/28/15  Yes Tiffany L Reed, DO  pregabalin (LYRICA) 75 MG capsule Take one tablet by mouth once daily for pains 01/09/15  Yes [name], NP  tamsulosin (FLOMAX) 0.4 MG CAPS capsule Take 1 capsule (0.4 mg total) by mouth daily. 04/28/15  Yes Tiffany L Reed, DO  AMBULATORY NON FORMULARY MEDICATION BD U/P Mini Pen [place] 31GX5MM Use three times daily as directed DX: E11.9 04/28/15   Tiffany L Reed, DO  AMBULATORY  NON FORMULARY MEDICATION One Touch Ultra 2 Test Strips Sig:  Check blood sugars Three times daily to keep blood sugars under control Dx: E11.9 04/28/15   Tiffany L Reed, DO  QUEtiapine (SEROQUEL) 25 MG tablet Take 1 tablet (25 mg total) by mouth daily. Patient not taking: Reported on 05/01/2015 04/28/15   Tiffany L Reed, DO  senna-docusate (SENOKOT-S) 8.6-50 MG tablet Take 2 tablets by mouth 2 (two) times daily. Patient not taking: Reported on 05/01/2015 01/09/15   [name], NP     Allergies:     Allergies  Allergen Reactions  . Oxycodone Other (See Comments)    Hallucinations     Physical Exam:   Vitals  Blood pressure 158/66, pulse 78, temperature 98.9 F (37.2 C), temperature source Oral, resp. rate 13, SpO2 100 %.   Gen.: Elderly male lying in bed not in distress HEENT: No pallor, no icterus, moist mucosa, supple neck, no cervical lymphadenopathy Chest: Clear bilaterally, no added sounds CVS: Normal S1 and S2, no murmurs rub or gallop GI: Soft, nondistended, nontender, bowel sounds present, stool for occult blood positive in ED Musculoskeletal: Bilateral BKA, left AV fistula CNS: Alert and oriented    Data Review:    CBC  Recent Labs Lab 05/01/15 1613  WBC 9.2  HGB 7.9*  HCT 24.6*  PLT 163  MCV 95.7  MCH 30.7  MCHC 32.1  RDW 14.0  LYMPHSABS 2.0  MONOABS 0.4  EOSABS 0.1  BASOSABS 0.0   ------------------------------------------------------------------------------------------------------------------  Chemistries   Recent Labs Lab 05/01/15 1613  NA 138  K 4.9  CL 96*  CO2 26  GLUCOSE 380*  BUN 35*  CREATININE 5.40*  CALCIUM 9.1  AST 15  ALT 10*  ALKPHOS 81  BILITOT 0.5   ------------------------------------------------------------------------------------------------------------------ CrCl cannot be calculated (Unknown ideal  weight.). ------------------------------------------------------------------------------------------------------------------ No results for input(s): TSH, T4TOTAL, T3FREE, THYROIDAB in the last 72 hours.  Invalid input(s): FREET3  Coagulation profile  Recent Labs Lab 05/01/15 1613  INR 1.06   ------------------------------------------------------------------------------------------------------------------- No results for input(s): DDIMER in the last 72 hours. -------------------------------------------------------------------------------------------------------------------  Cardiac Enzymes No results for input(s): CKMB, TROPONINI, MYOGLOBIN in the last 168 hours.  Invalid input(s): CK ------------------------------------------------------------------------------------------------------------------ No results found for: BNP   ---------------------------------------------------------------------------------------------------------------  Urinalysis    Component Value Date/Time   COLORURINE YELLOW 10/15/2014 [place] 10/15/2014 1344   APPEARANCEUR Turbid* 07/12/2014 1644   LABSPEC 1.017 10/15/2014 1344   PHURINE 6.0 10/15/2014 1344   GLUCOSEU >1000* 10/15/2014 1344   HGBUR SMALL* 10/15/2014 1344   BILIRUBINUR NEGATIVE 10/15/2014 1344   BILIRUBINUR Negative 07/12/2014 1644   BILIRUBINUR  07/12/2014 1506     Comment:     Values left blank- unable to determine    KETONESUR NEGATIVE 10/15/2014 1344   PROTEINUR >300* 10/15/2014 1344   PROTEINUR 4+* 07/12/2014 1644   UROBILINOGEN 0.2 10/15/2014 1344   NITRITE NEGATIVE 10/15/2014 1344   NITRITE Positive* 07/12/2014 1644   NITRITE Neg 07/12/2014 1506   LEUKOCYTESUR NEGATIVE 10/15/2014 1344   LEUKOCYTESUR 3+*  07/12/2014 1644    ----------------------------------------------------------------------------------------------------------------   Imaging Results:    No results found.  EKG: Pending    Assessment & Plan:    Principal Problem:   Rectal bleeding with acute blood anemia Admit to telemetry. Significant drop in H&H from his baseline (12-13). Suspect lower GI bleed based on history. Transfuse 1 units PRBC. IV Protonix twice a day. Monitor H&H every 6 hours. Discussed with Dr. Hilarie Fredrickson ( [facility]). Recommended clear liquids for now and keep nothing by mouth after midnight. Hold aspirin and Plavix.  GI will see patient in the morning.  Active Problems: Uncontrolled type 2 diabetes mellitus A1c >12. Monitor on sliding scale insulin. Continue home dose Levemir. Will hold off on pre-meal aspart as patient is on clears and will be nothing by mouth after midnight.    CAD (coronary artery disease) Stable. Continue statin    PAD (peripheral artery disease) (HCC) Hold aspirin and Plavix. Continue statin     GERD (gastroesophageal reflux disease) Placed on IV Protonix.     ESRD on dialysis [facility]) For dialysis tomorrow. Makes some urine.Spoke with Dr. Mercy Moore..  Continue PhosLo.  History of CHF Euvolemic. Last 2-D echo with low normal EF.    DVT Prophylaxis : None, (active GI bleed and BKA status)  AM Labs Ordered, also please review Full Orders  Family Communication: Wife and daughters at bedside  Code Status full code  Likely DC to  home  Condition : Stable  Consults called: [facility]  Admission status: Inpatient   Time spent in minutes : 70 minutes Louellen Molder M.D on 05/01/2015 at 5:28 PM PAGER: [phone] Between 7am to 7pm - Pager - [phone]. After 7pm go to [email or web address] - password [facility]  [facility] - Office  [phone]

## 2015-05-01 NOTE — ED Provider Notes (Addendum)
CSN: [id number]     Arrival date & time 05/01/15  1405 History   First MD Initiated Contact with Patient 05/01/15 1410     Chief Complaint  Patient presents with  . Blood In Stools     Patient is a 75 y.o. male presenting with hematochezia. The history is provided by the patient.  Rectal Bleeding Quality:  Bright red Amount:  Moderate Duration:  3 days Timing:  Intermittent (it happened twice today, once each day prior) Context: not constipation and not hemorrhoids   Relieved by:  Nothing Associated symptoms: light-headedness   Associated symptoms: no abdominal pain, no fever and no vomiting   Risk factors: no hx of colorectal cancer and no NSAID use     Past Medical History  Diagnosis Date  . PAD (peripheral artery disease) ([place])     a. R CEA 2007. b. Hx L BKA in 2013. c. s/p LE angioplasty in 2014. d. Hx R BKA in 2014.  . Gangrene of toe ([facility])     dry  . Neuromuscular disorder ([place])     diabetic neuropathy  . Hypertension   . Hyperlipidemia   . Peripheral neuropathy ([place])   . Constipation     takes Miralax daily  . Hemorrhoids   . Hx of colonic polyps   . Coronary artery disease     a. s/p NSTEMI/CABGx4 in 2007 - LIMA-LAD, SVG-optional diagonal, SVVG-OM, SVG-dRCA. b. Nuc 05/2011: nonischemic.  [name] BPH (benign prostatic hypertrophy)   . Abnormality of gait   . DVT (deep venous thrombosis) ([place])     a. Lower extremity DVT in 2013.  [name] Hypertrophy of prostate without urinary obstruction and other lower urinary tract symptoms (LUTS)   . Disorder of bone and cartilage, unspecified   . Lower limb amputation, below knee   . Anemia   . Secondary hyperparathyroidism ([place])     Secondary Hyperpara- Thyroidism, Renal  . Ischemic cardiomyopathy     a. EF 40% in 2007. b. 51% by nuc 05/2011.  [name] Complication of anesthesia     "he gets delirious"  . Myocardial infarction ([place]) 2005  . Type II diabetes mellitus ([place])   . Diabetic peripheral neuropathy ([place])   . Prostate cancer ([place])  2010  . ESRD on dialysis [facility])     M-W-F  . Kidney stones    Past Surgical History  Procedure Laterality Date  . Prostatectomy  2009  . Knee cartilage surgery Left 1964  . Coronary artery bypass graft  2005    CABG X4  . Carotid endarterectomy Right 2005  . Cataract extraction w/ intraocular lens  implant, bilateral Bilateral 2011  . Colonoscopy    . Cardiac catheterization  05/28/11  . Amputation  06/14/2011    Procedure: AMPUTATION DIGIT;  Surgeon: Elam Dutch, MD;  Location: [facility] OR;  Service: Vascular;  Laterality: Left;  Amputation Left fifth toe  . Amputation  06/16/2011    Procedure: AMPUTATION BELOW KNEE;  Surgeon: Elam Dutch, MD;  Location: [place];  Service: Vascular;  Laterality: Left;  . I&d extremity  01/31/2012    Procedure: IRRIGATION AND DEBRIDEMENT EXTREMITY;  Surgeon: Elam Dutch, MD;  Location: [place];  Service: Vascular;  Laterality: Left;  I & D Left BKA   . Amputation Right 06/12/2012    Procedure: AMPUTATION DIGIT;  Surgeon: Elam Dutch, MD;  Location: [facility] OR;  Service: Vascular;  Laterality: Right;  GREAT TOE  . Amputation Right 08/08/2012  Procedure: AMPUTATION BELOW KNEE;  Surgeon: Elam Dutch, MD;  Location: [place];  Service: Vascular;  Laterality: Right;  . Abdominal aortagram N/A 05/28/2011    Procedure: ABDOMINAL Maxcine Ham;  Surgeon: Elam Dutch, MD;  Location: [facility] CATH LAB;  Service: Cardiovascular;  Laterality: N/A;  . Abdominal aortagram N/A 06/02/2012    Procedure: ABDOMINAL Maxcine Ham;  Surgeon: Elam Dutch, MD;  Location: [facility] CATH LAB;  Service: Cardiovascular;  Laterality: N/A;  . Abdominal aortagram N/A 06/09/2012    Procedure: ABDOMINAL Maxcine Ham;  Surgeon: Elam Dutch, MD;  Location: [facility] CATH LAB;  Service: Cardiovascular;  Laterality: N/A;  . Insertion of dialysis catheter Right 04/19/2014    Procedure: INSERTION OF DIALYSIS CATHETER-INTERNAL JUGULAR;  Surgeon: Mal Misty, MD;  Location: [place];  Service: Vascular;   Laterality: Right;  . Av fistula placement Right 04/19/2014    Procedure: RIGHT ARM ARTERIOVENOUS (AV) FISTULA CREATION;  Surgeon: Mal Misty, MD;  Location: [place];  Service: Vascular;  Laterality: Right;  . Ligation of competing branches of arteriovenous fistula Right 06/19/2014    Procedure: Right Arm LIGATION OF COMPETING BRANCHES OF RADIOCEPHALIC ARTERIOVENOUS FISTULA;  Surgeon: Mal Misty, MD;  Location: [place];  Service: Vascular;  Laterality: Right;  . Cardiac catheterization N/A 10/04/2014    Procedure: Left Heart Cath and Coronary Angiography;  Surgeon: Sherren Mocha, MD;  Location: [place] CV LAB;  Service: Cardiovascular;  Laterality: N/A;  . Endarterectomy Left 10/17/2014    Procedure: ENDARTERECTOMY CAROTID;  Surgeon: Mal Misty, MD;  Location: [place];  Service: Vascular;  Laterality: Left;  . Ligation of arteriovenous  fistula Right 11/06/2014    Procedure: LIGATION OF RADIOCEPHALIC ARTERIOVENOUS FISTULA;  Surgeon: Mal Misty, MD;  Location: [place];  Service: Vascular;  Laterality: Right;  . Insertion of dialysis catheter N/A 11/06/2014    Procedure: INSERTION OF 23cm DIALYSIS CATHETER - right internal jugular;  Surgeon: Mal Misty, MD;  Location: [place];  Service: Vascular;  Laterality: N/A;  . Av fistula placement Left 12/12/2014    Procedure: BRACHIOCEPHALIC ARTERIOVENOUS (AV) FISTULA CREATION;  Surgeon: Mal Misty, MD;  Location: [facility] OR;  Service: Vascular;  Laterality: Left;   Family History  Problem Relation Age of Onset  . Coronary artery disease Neg Hx   . Anesthesia problems Neg Hx   . Hypotension Neg Hx   . Malignant hyperthermia Neg Hx   . Pseudochol deficiency Neg Hx   . Hyperlipidemia Mother   . Hypertension Mother   . Cancer Mother   . Hyperlipidemia Father   . Hypertension Father   . Kidney disease Father   . Heart disease Sister   . Alzheimer's disease Sister   . Cancer Brother   . Other Sister    Social History  Substance Use Topics   . Smoking status: Former Smoker -- 1.00 packs/day for 30 years    Types: Cigarettes    Quit date: 04/28/1991  . Smokeless tobacco: Never Used  . Alcohol Use: Yes     Comment: 04/17/2014 "might have a glass of wine at Christmas"    Review of Systems  Constitutional: Negative for fever.  Gastrointestinal: Positive for hematochezia. Negative for vomiting and abdominal pain.  Neurological: Positive for light-headedness.  All other systems reviewed and are negative.     Allergies  Oxycodone  Home Medications   Prior to Admission medications   Medication Sig Start Date End Date Taking? Authorizing Provider  amLODipine (NORVASC) 10 MG  tablet Take 1 tablet (10 mg total) by mouth daily. 04/28/15  Yes Tiffany L Reed, DO  aspirin EC 81 MG tablet Take 1 tablet (81 mg total) by mouth every morning. 04/28/15  Yes Tiffany L Reed, DO  calcium acetate (PHOSLO) 667 MG capsule Take 1 capsule (667 mg total) by mouth 3 (three) times daily with meals. 09/14/14  Yes Florencia Reasons, MD  clopidogrel (PLAVIX) 75 MG tablet Take 1 tablet (75 mg total) by mouth daily. 04/28/15  Yes Tiffany L Reed, DO  HYDROcodone-acetaminophen (NORCO/VICODIN) 5-325 MG tablet Take 1 tablet by mouth every 6 (six) hours as needed for moderate pain. 04/10/15  Yes Tiffany L Reed, DO  insulin aspart (NOVOLOG) 100 UNIT/ML FlexPen Inject 5 Units into the skin 3 (three) times daily with meals. Inject 5 units three times a day with meals if blood sugar greater than 150 only, DX: 250.71 04/28/15  Yes Tiffany L Reed, DO  Insulin Detemir (LEVEMIR) 100 UNIT/ML Pen Inject 17 Units into the skin 2 (two) times daily. 04/28/15  Yes Tiffany L Reed, DO  pravastatin (PRAVACHOL) 40 MG tablet Take 1 tablet (40 mg total) by mouth daily. 04/28/15  Yes Tiffany L Reed, DO  pregabalin (LYRICA) 75 MG capsule Take one tablet by mouth once daily for pains 01/09/15  Yes [name], NP  tamsulosin (FLOMAX) 0.4 MG CAPS capsule Take 1 capsule (0.4 mg total) by mouth  daily. 04/28/15  Yes Tiffany L Reed, DO  AMBULATORY NON FORMULARY MEDICATION BD U/P Mini Pen [place] 31GX5MM Use three times daily as directed DX: E11.9 04/28/15   Tiffany L Reed, DO  AMBULATORY NON FORMULARY MEDICATION One Touch Ultra 2 Test Strips Sig:  Check blood sugars Three times daily to keep blood sugars under control Dx: E11.9 04/28/15   Tiffany L Reed, DO  QUEtiapine (SEROQUEL) 25 MG tablet Take 1 tablet (25 mg total) by mouth daily. Patient not taking: Reported on 05/01/2015 04/28/15   Tiffany L Reed, DO  senna-docusate (SENOKOT-S) 8.6-50 MG tablet Take 2 tablets by mouth 2 (two) times daily. Patient not taking: Reported on 05/01/2015 01/09/15   [name], NP   BP 160/55 mmHg  Pulse 76  Temp(Src) 98.9 F (37.2 C) (Oral)  Resp 11  SpO2 100% Physical Exam  Constitutional: He appears well-developed and well-nourished. No distress.  HENT:  Head: Normocephalic and atraumatic.  Right Ear: External ear normal.  Left Ear: External ear normal.  Eyes: Conjunctivae are normal. Right eye exhibits no discharge. Left eye exhibits no discharge. No scleral icterus.  Neck: Neck supple. No tracheal deviation present.  Cardiovascular: Normal rate, regular rhythm and intact distal pulses.   Pulmonary/Chest: Effort normal and breath sounds normal. No stridor. No respiratory distress. He has no wheezes. He has no rales.  Abdominal: Soft. Bowel sounds are normal. He exhibits no distension. There is no tenderness. There is no rebound and no guarding.  Genitourinary: Rectal exam shows tenderness. Rectal exam shows no internal hemorrhoid and no mass.  Scant red blood  Musculoskeletal: He exhibits no edema or tenderness.  S/p bka bilaterally  Neurological: He is alert. He has normal strength. No cranial nerve deficit (no facial droop, extraocular movements intact, no slurred speech) or sensory deficit. He exhibits normal muscle tone. He displays no seizure activity. Coordination normal.  Skin:  Skin is warm and dry. No rash noted.  Psychiatric: He has a normal mood and affect.  Nursing note and vitals reviewed.   ED Course  Procedures (including  critical care time) Labs Review Labs Reviewed  COMPREHENSIVE METABOLIC PANEL  CBC WITH DIFFERENTIAL/PLATELET  APTT  PROTIME-INR  TYPE AND SCREEN    MDM   Pt presents with rectal bleeding.  He does have blood on rectal exam.  No obvious mass or abnormality on exam.  Will check labs.    1648  Hgb is decreased from baseline.  Now down to 7.9  BP remains stable.  Will monitor.  Plan on admission, GI consultation.  Dorie Rank, MD 05/01/15 [phone]

## 2015-05-02 DIAGNOSIS — D62 Acute posthemorrhagic anemia: Secondary | ICD-10-CM

## 2015-05-02 DIAGNOSIS — K922 Gastrointestinal hemorrhage, unspecified: Secondary | ICD-10-CM

## 2015-05-02 LAB — BASIC METABOLIC PANEL
ANION GAP: 15 (ref 5–15)
BUN: 43 mg/dL — ABNORMAL HIGH (ref 6–20)
CHLORIDE: 97 mmol/L — AB (ref 101–111)
CO2: 26 mmol/L (ref 22–32)
Calcium: 9.2 mg/dL (ref 8.9–10.3)
Creatinine, Ser: 5.75 mg/dL — ABNORMAL HIGH (ref 0.61–1.24)
GFR calc non Af Amer: 9 mL/min — ABNORMAL LOW (ref >60)
GFR, EST AFRICAN AMERICAN: 10 mL/min — AB (ref >60)
Glucose, Bld: 105 mg/dL — ABNORMAL HIGH (ref 65–99)
POTASSIUM: 3.3 mmol/L — AB (ref 3.5–5.1)
Sodium: 138 mmol/L (ref 135–145)

## 2015-05-02 LAB — HEMOGLOBIN AND HEMATOCRIT, BLOOD
HCT: 27.9 % — ABNORMAL LOW (ref 39.0–52.0)
HCT: 26.2 % — ABNORMAL LOW (ref 39.0–52.0)
HEMOGLOBIN: 8.8 g/dL — AB (ref 13.0–17.0)
HEMOGLOBIN: 8.4 g/dL — AB (ref 13.0–17.0)

## 2015-05-02 LAB — GLUCOSE, CAPILLARY
GLUCOSE-CAPILLARY: 147 mg/dL — AB (ref 65–99)
GLUCOSE-CAPILLARY: 191 mg/dL — AB (ref 65–99)
GLUCOSE-CAPILLARY: 50 mg/dL — AB (ref 65–99)
GLUCOSE-CAPILLARY: 76 mg/dL (ref 65–99)
Glucose-Capillary: 80 mg/dL (ref 65–99)
Glucose-Capillary: 129 mg/dL — ABNORMAL HIGH (ref 65–99)
Glucose-Capillary: 167 mg/dL — ABNORMAL HIGH (ref 65–99)
Glucose-Capillary: 248 mg/dL — ABNORMAL HIGH (ref 65–99)

## 2015-05-02 MED ORDER — SODIUM CHLORIDE 0.9 % IV SOLN: INTRAVENOUS | Status: DC

## 2015-05-02 MED ORDER — DEXTROSE 50 % IV SOLN
25.0000 mL | Freq: Once | INTRAVENOUS | Status: AC
  Administered 2015-05-02: 25 mL via INTRAVENOUS
  Filled 2015-05-02: qty 50

## 2015-05-02 MED ORDER — PEG-KCL-NACL-NASULF-NA ASC-C 100 G PO SOLR: 1.0000 | Freq: Once | ORAL | Status: DC

## 2015-05-02 MED ORDER — DARBEPOETIN ALFA 60 MCG/0.3ML IJ SOSY: 60.0000 ug | PREFILLED_SYRINGE | INTRAMUSCULAR | Status: DC

## 2015-05-02 MED ORDER — PEG-KCL-NACL-NASULF-NA ASC-C 100 G PO SOLR
0.5000 | Freq: Once | ORAL | Status: AC
  Administered 2015-05-03: 100 g via ORAL

## 2015-05-02 MED ORDER — DOXERCALCIFEROL 4 MCG/2ML IV SOLN
4.0000 ug | INTRAVENOUS | Status: DC
  Administered 2015-05-03 – 2015-05-05 (×2): 4 ug via INTRAVENOUS
  Filled 2015-05-02: qty 2

## 2015-05-02 MED ORDER — PEG-KCL-NACL-NASULF-NA ASC-C 100 G PO SOLR
0.5000 | Freq: Once | ORAL | Status: AC
  Administered 2015-05-02: 100 g via ORAL
  Filled 2015-05-02: qty 1

## 2015-05-02 MED ORDER — CALCIUM ACETATE (PHOS BINDER) 667 MG PO CAPS
667.0000 mg | ORAL_CAPSULE | Freq: Three times a day (TID) | ORAL | Status: DC
  Administered 2015-05-02 – 2015-05-04 (×8): 667 mg via ORAL
  Filled 2015-05-02 (×8): qty 1

## 2015-05-02 NOTE — Progress Notes (Addendum)
Inpatient Diabetes Program Recommendations  AACE/ADA: New Consensus Statement on Inpatient Glycemic Control (2015)  Target Ranges:  Prepandial:   less than 140 mg/dL      Peak postprandial:   less than 180 mg/dL (1-2 hours)      Critically ill patients:  140 - 180 mg/dL   Results for Aaron Long, Aaron Long (MRN [id number]) as of 05/02/2015 10:53  Ref. Range 05/01/2015 22:32 05/02/2015 00:37 05/02/2015 03:02 05/02/2015 04:12 05/02/2015 05:02 05/02/2015 08:35  Glucose-Capillary Latest Ref Range: 65-99 mg/dL 283 (H) 191 (H) 50 (L) 76 80 129 (H)    Admit with: Melena  History: DM, ESRD, CHF  Home DM Meds: Levemir 17 units bid       Novolog 5 units tidwc  Current Insulin Orders: Novolog Moderate Correction Scale/ SSI (0-15 units) Q4 hours      -Note patient Hypoglycemic today at 4am (CBG 50 mg/dl).  Pt received 10 units Levemir last PM.  Patient also received 8 units Novolog at 8pm + 3 units Novolog at 1:30am.  -Given patient's renal function, I wonder if the Novolog stacked up and precipitated the Hypoglycemic event instead of the Levemir?  -Note Levemir 10 units bid discontinued this AM.     MD- If patient's CBGs become elevated and you think she needs basal insulin back, please consider restarting Levemir at lower dose- Levemir 8 units bid (approximately 50% home dose)       --Will follow patient during hospitalization--  Wyn Quaker RN, MSN, CDE Diabetes Coordinator Inpatient Glycemic Control Team Team Pager: [phone] (8a-5p)

## 2015-05-02 NOTE — Consult Note (Signed)
[facility] Renal Consultation Note    Indication for Consultation:  Management of ESRD/hemodialysis; anemia, hypertension/volume and secondary hyperparathyroidism PCP:  HPI: Aaron Long is a 75 y.o. male with ESRD, CAD hx of NSTEMI, PAD,  DM,  on MWF HD at [place] started having blood in stools last Tuesday. He received his usual 8500 U of heparin 4/19 plus he is also on asa and plavix..  hgb 4/19 at dialysis was 9.5 down from 10.4 4/13 and 11.2 4/5.  BP at dialysis was stable.  He continued to have worsening painless BRBPR mixed with stool and came to the ED for further evaluation. GI consult is in progress. He is currently NPO.  Initial Hgb yesterday was 7.9. He has received 1 unit so far. With hgb up to .8.8 K was 4.6 with BS 300s yesteday.  K today down to 3.3 with BS 105. He continues to have bloody stools. He denies pain anywhere, SOB, fever, chills, CP, N, V or prior GIB. He is fully compliant with outpt HD treatments.  Past Medical History  Diagnosis Date  . PAD (peripheral artery disease) ([place])     a. R CEA 2007. b. Hx L BKA in 2013. c. s/p LE angioplasty in 2014. d. Hx R BKA in 2014.  . Gangrene of toe (HCC)     dry  . Neuromuscular disorder ([place])     diabetic neuropathy  . Hypertension   . Hyperlipidemia   . Peripheral neuropathy ([place])   . Constipation     takes Miralax daily  . Hemorrhoids   . Hx of colonic polyps   . Coronary artery disease     a. s/p NSTEMI/CABGx4 in 2007 - LIMA-LAD, SVG-optional diagonal, SVVG-OM, SVG-dRCA. b. Nuc 05/2011: nonischemic.  [name] BPH (benign prostatic hypertrophy)   . Abnormality of gait   . DVT (deep venous thrombosis) ([place])     a. Lower extremity DVT in 2013.  [name] Hypertrophy of prostate without urinary obstruction and other lower urinary tract symptoms (LUTS)   . Disorder of bone and cartilage, unspecified   . Lower limb amputation, below knee   . Anemia   . Secondary hyperparathyroidism ([place])     Secondary Hyperpara-  Thyroidism, Renal  . Ischemic cardiomyopathy     a. EF 40% in 2007. b. 51% by nuc 05/2011.  [name] Complication of anesthesia     "he gets delirious"  . Myocardial infarction ([place]) 2005  . Type II diabetes mellitus ([place])   . Diabetic peripheral neuropathy ([place])   . Prostate cancer ([place]) 2010  . ESRD on dialysis [facility])     M-W-F  . Kidney stones    Past Surgical History  Procedure Laterality Date  . Prostatectomy  2009  . Knee cartilage surgery Left 1964  . Coronary artery bypass graft  2005    CABG X4  . Carotid endarterectomy Right 2005  . Cataract extraction w/ intraocular lens  implant, bilateral Bilateral 2011  . Colonoscopy    . Cardiac catheterization  05/28/11  . Amputation  06/14/2011    Procedure: AMPUTATION DIGIT;  Surgeon: Elam Dutch, MD;  Location: [facility] OR;  Service: Vascular;  Laterality: Left;  Amputation Left fifth toe  . Amputation  06/16/2011    Procedure: AMPUTATION BELOW KNEE;  Surgeon: Elam Dutch, MD;  Location: [place];  Service: Vascular;  Laterality: Left;  . I&d extremity  01/31/2012    Procedure: IRRIGATION AND DEBRIDEMENT EXTREMITY;  Surgeon: Elam Dutch, MD;  Location: [facility] OR;  Service: Vascular;  Laterality: Left;  I & D Left BKA   . Amputation Right 06/12/2012    Procedure: AMPUTATION DIGIT;  Surgeon: Elam Dutch, MD;  Location: [facility] OR;  Service: Vascular;  Laterality: Right;  GREAT TOE  . Amputation Right 08/08/2012    Procedure: AMPUTATION BELOW KNEE;  Surgeon: Elam Dutch, MD;  Location: [facility] OR;  Service: Vascular;  Laterality: Right;  . Abdominal aortagram N/A 05/28/2011    Procedure: ABDOMINAL Maxcine Ham;  Surgeon: Elam Dutch, MD;  Location: [facility] CATH LAB;  Service: Cardiovascular;  Laterality: N/A;  . Abdominal aortagram N/A 06/02/2012    Procedure: ABDOMINAL Maxcine Ham;  Surgeon: Elam Dutch, MD;  Location: [facility] CATH LAB;  Service: Cardiovascular;  Laterality: N/A;  . Abdominal aortagram N/A 06/09/2012    Procedure: ABDOMINAL Maxcine Ham;   Surgeon: Elam Dutch, MD;  Location: [facility] CATH LAB;  Service: Cardiovascular;  Laterality: N/A;  . Insertion of dialysis catheter Right 04/19/2014    Procedure: INSERTION OF DIALYSIS CATHETER-INTERNAL JUGULAR;  Surgeon: Mal Misty, MD;  Location: [place];  Service: Vascular;  Laterality: Right;  . Av fistula placement Right 04/19/2014    Procedure: RIGHT ARM ARTERIOVENOUS (AV) FISTULA CREATION;  Surgeon: Mal Misty, MD;  Location: [place];  Service: Vascular;  Laterality: Right;  . Ligation of competing branches of arteriovenous fistula Right 06/19/2014    Procedure: Right Arm LIGATION OF COMPETING BRANCHES OF RADIOCEPHALIC ARTERIOVENOUS FISTULA;  Surgeon: Mal Misty, MD;  Location: [place];  Service: Vascular;  Laterality: Right;  . Cardiac catheterization N/A 10/04/2014    Procedure: Left Heart Cath and Coronary Angiography;  Surgeon: Sherren Mocha, MD;  Location: [place] CV LAB;  Service: Cardiovascular;  Laterality: N/A;  . Endarterectomy Left 10/17/2014    Procedure: ENDARTERECTOMY CAROTID;  Surgeon: Mal Misty, MD;  Location: [place];  Service: Vascular;  Laterality: Left;  . Ligation of arteriovenous  fistula Right 11/06/2014    Procedure: LIGATION OF RADIOCEPHALIC ARTERIOVENOUS FISTULA;  Surgeon: Mal Misty, MD;  Location: [place];  Service: Vascular;  Laterality: Right;  . Insertion of dialysis catheter N/A 11/06/2014    Procedure: INSERTION OF 23cm DIALYSIS CATHETER - right internal jugular;  Surgeon: Mal Misty, MD;  Location: [place];  Service: Vascular;  Laterality: N/A;  . Av fistula placement Left 12/12/2014    Procedure: BRACHIOCEPHALIC ARTERIOVENOUS (AV) FISTULA CREATION;  Surgeon: Mal Misty, MD;  Location: [facility] OR;  Service: Vascular;  Laterality: Left;   Family History  Problem Relation Age of Onset  . Coronary artery disease Neg Hx   . Anesthesia problems Neg Hx   . Hypotension Neg Hx   . Malignant hyperthermia Neg Hx   . Pseudochol deficiency Neg Hx   .  Hyperlipidemia Mother   . Hypertension Mother   . Cancer Mother   . Hyperlipidemia Father   . Hypertension Father   . Kidney disease Father   . Heart disease Sister   . Alzheimer's disease Sister   . Cancer Brother   . Other Sister    Social History:  reports that he quit smoking about 24 years ago. His smoking use included Cigarettes. He has a 30 pack-year smoking history. He has never used smokeless tobacco. He reports that he drinks alcohol. He reports that he uses illicit drugs (Marijuana). Allergies  Allergen Reactions  . Oxycodone Other (See Comments)    Hallucinations   Prior to Admission medications   Medication  Sig Start Date End Date Taking? Authorizing Provider  amLODipine (NORVASC) 10 MG tablet Take 1 tablet (10 mg total) by mouth daily. 04/28/15  Yes Tiffany L Reed, DO  aspirin EC 81 MG tablet Take 1 tablet (81 mg total) by mouth every morning. 04/28/15  Yes Tiffany L Reed, DO  calcium acetate (PHOSLO) 667 MG capsule Take 1 capsule (667 mg total) by mouth 3 (three) times daily with meals. 09/14/14  Yes Florencia Reasons, MD  clopidogrel (PLAVIX) 75 MG tablet Take 1 tablet (75 mg total) by mouth daily. 04/28/15  Yes Tiffany L Reed, DO  HYDROcodone-acetaminophen (NORCO/VICODIN) 5-325 MG tablet Take 1 tablet by mouth every 6 (six) hours as needed for moderate pain. 04/10/15  Yes Tiffany L Reed, DO  insulin aspart (NOVOLOG) 100 UNIT/ML FlexPen Inject 5 Units into the skin 3 (three) times daily with meals. Inject 5 units three times a day with meals if blood sugar greater than 150 only, DX: 250.71 04/28/15  Yes Tiffany L Reed, DO  Insulin Detemir (LEVEMIR) 100 UNIT/ML Pen Inject 17 Units into the skin 2 (two) times daily. 04/28/15  Yes Tiffany L Reed, DO  pravastatin (PRAVACHOL) 40 MG tablet Take 1 tablet (40 mg total) by mouth daily. 04/28/15  Yes Tiffany L Reed, DO  pregabalin (LYRICA) 75 MG capsule Take one tablet by mouth once daily for pains 01/09/15  Yes [name], NP  tamsulosin  (FLOMAX) 0.4 MG CAPS capsule Take 1 capsule (0.4 mg total) by mouth daily. 04/28/15  Yes Tiffany L Reed, DO  AMBULATORY NON FORMULARY MEDICATION BD U/P Mini Pen [place] 31GX5MM Use three times daily as directed DX: E11.9 04/28/15   Tiffany L Reed, DO  AMBULATORY NON FORMULARY MEDICATION One Touch Ultra 2 Test Strips Sig:  Check blood sugars Three times daily to keep blood sugars under control Dx: E11.9 04/28/15   Tiffany L Reed, DO  QUEtiapine (SEROQUEL) 25 MG tablet Take 1 tablet (25 mg total) by mouth daily. Patient not taking: Reported on 05/01/2015 04/28/15   Tiffany L Reed, DO  senna-docusate (SENOKOT-S) 8.6-50 MG tablet Take 2 tablets by mouth 2 (two) times daily. Patient not taking: Reported on 05/01/2015 01/09/15   [name], NP   Current Facility-Administered Medications  Medication Dose Route Frequency Provider Last Rate Last Dose  . acetaminophen (TYLENOL) tablet 650 mg  650 mg Oral Q6H PRN Nishant Dhungel, MD       Or  . acetaminophen (TYLENOL) suppository 650 mg  650 mg Rectal Q6H PRN Nishant Dhungel, MD      . amLODipine (NORVASC) tablet 10 mg  10 mg Oral Daily Nishant Dhungel, MD      . calcium acetate (PHOSLO) capsule 667 mg  667 mg Oral TID WC Nishant Dhungel, MD   667 mg at 05/02/15 0900  . doxercalciferol (HECTOROL) injection 4 mcg  4 mcg Intravenous Q M,W,F-HD Alric Seton, PA-C      . HYDROcodone-acetaminophen (NORCO/VICODIN) 5-325 MG per tablet 1 tablet  1 tablet Oral Q6H PRN Nishant Dhungel, MD      . insulin aspart (novoLOG) injection 0-15 Units  0-15 Units Subcutaneous Q4H Nishant Dhungel, MD   3 Units at 05/02/15 0137  . ondansetron (ZOFRAN) tablet 4 mg  4 mg Oral Q6H PRN Nishant Dhungel, MD       Or  . ondansetron (ZOFRAN) injection 4 mg  4 mg Intravenous Q6H PRN Nishant Dhungel, MD      . pantoprazole (PROTONIX) injection 40 mg  40  mg Intravenous Q12H Nishant Dhungel, MD   40 mg at 05/01/15 2243  . pravastatin (PRAVACHOL) tablet 40 mg  40 mg Oral Daily  Nishant Dhungel, MD      . pregabalin (LYRICA) capsule 75 mg  75 mg Oral Daily Nishant Dhungel, MD      . sodium chloride flush (NS) 0.9 % injection 3 mL  3 mL Intravenous Q12H Nishant Dhungel, MD   3 mL at 05/01/15 2200  . tamsulosin (FLOMAX) capsule 0.4 mg  0.4 mg Oral Daily Nishant Dhungel, MD       Labs: Basic Metabolic Panel:  Recent Labs Lab 05/01/15 1613 05/02/15 0336  NA 138 138  K 4.9 3.3*  CL 96* 97*  CO2 26 26  GLUCOSE 380* 105*  BUN 35* 43*  CREATININE 5.40* 5.75*  CALCIUM 9.1 9.2   Liver Function Tests:  Recent Labs Lab 05/01/15 1613  AST 15  ALT 10*  ALKPHOS 81  BILITOT 0.5  PROT 6.3*  ALBUMIN 3.1*    CBC:  Recent Labs Lab 05/01/15 1613 05/01/15 2248 05/02/15 0336  WBC 9.2  --   --   NEUTROABS 6.6  --   --   HGB 7.9* 9.2* 8.8*  HCT 24.6* 28.8* 27.9*  MCV 95.7  --   --   PLT 163  --   --     CBG:  Recent Labs Lab 05/02/15 0037 05/02/15 0302 05/02/15 0412 05/02/15 0502 05/02/15 0835  GLUCAP 191* 50* 76 80 129*    ROS: As per HPI otherwise negative.   Physical Exam: Filed Vitals:   05/02/15 0036 05/02/15 0431 05/02/15 0849 05/02/15 0900  BP: 126/58 130/57 135/42 110/60  Pulse: 71 66 81 83  Temp: 99 F (37.2 C) 98.1 F (36.7 C) 98.4 F (36.9 C)   TempSrc: Oral Oral Oral   Resp:   18 16  Height:      Weight:  78.563 kg (173 lb 3.2 oz)    SpO2: 94% 100% 100% 93%     General: elderly AAM NAD but a little weak; room smells of bloody stool Head: Normocephalic, atraumatic, sclera non-icteric, mucus membranes are moist Neck: Supple. JVD not elevated. Lungs: Clear bilaterally to auscultation without wheezes, rales, or rhonchi. Breathing is unlabored. Heart: RRR with S1 S2.  Abdomen: Soft, non-tender, non-distended with normoactive bowel sounds Lower extremities: bilateral BKA no overt edema Neuro: Alert and oriented X 3. Moves all extremities spontaneously. Psych:  Responds to questions appropriately with a normal  affect. Dialysis Access: left lower AVF + bruit  Dialysis Orders:  MWF AF 4.25 hours left lower AVF and right IJ (supposed to be removed 4/22 at [facility]) -AVF running at 300 - 400 at outpt unit 2K 2.25 Ca heparin 8500 EDW 78 Hectorol 4 Mircera 50 q 2 weeks iPTH 521 33% sat Hgb as per HPI last Mircera 50 q 2 4/19  Assessment/Plan: 1. Acute lower GIB - GI following; transfuse prn- we can schedule HD around GI procedures 2. ESRD -  MWF - no heparin; no overt need for HD today; K down to 3.3 due to lower glu; npo at present; Hd can be deferred to Sat if needed 3. Hypertension/volume  - norvasc 10 and our outpt med list said he was on hydralazine 100 tid BUT list was not updated since 07/2014 so likely not on anymore 4. Anemia  - hgb up to 8,8 after transfusion - suspect he will need more blood - keep over 8 due to CAD  hx; Mircera 50 given 4/19 - redose aranesp next week if still here s/p 1 unit PRBC so far 5. Metabolic bone disease -  Cont hectorol, on phoslo 1 ac-  6. Nutrition - npo 7. ASCVD s/p NSTEMI, CEA, bilateral BKA - \ 8. DM - per primary 9. Depression - on seroquel  Myriam Jacobson, PA-C [place] [phone] 05/02/2015, 11:08 AM   Pt seen, examined and agree w A/P as above. ESRD pt w DM, bilat BKA and hx NSTEMI presenting with bloody stools for 2 -3 days. Due for HD today, stable from our standpoint.    Kelly Splinter MD [employer] pager [phone]    cell [phone] 05/02/2015, 2:48 PM

## 2015-05-02 NOTE — Progress Notes (Signed)
PROGRESS NOTE                                                                                                                                                                                                             Patient Demographics:    Aaron Long, is a 75 y.o. male, DOB - [birth year], PB:[id number]  Admit date - 05/01/2015   Admitting Physician Louellen Molder, MD  Outpatient Primary MD for the patient is REED, TIFFANY, DO  LOS - 1  Outpatient Specialists:Nephrology  Chief Complaint  Patient presents with  . Blood In Stools       Brief Narrative   75 year old male with multiple comorbidities including ESRD on dialysis, CAD with history of CABG, uncontrolled type 2 diabetes mellitus (A1c >12) with diabetic neuropathy and peripheral vascular disease status post bilateral BKA's, wheelchair-bound, history of CHF, TIA presented to the ED with 2 day history of bright red blood per rectum occasionally mixed with stool. Patient found to have significant drop in his H&H (7.9 from baseline of 12-13 recently). Admitted to hospitalist service, GI and nephrology consulted.   Subjective:   Patient reports another episode of bright red blood per rectum this morning   Assessment  & Plan :   Principal Problem:  Rectal bleeding with acute blood anemia Continue telemetry monitoring. Suspect lower GI bleed. Hemoglobin improved with 1 unit PRBC on admission. Monitor H&H every 6 hours. IV Protonix twice daily. [facility] consulted. Continue nothing by mouth. Aspirin and Plavix on hold.   Active Problems: Uncontrolled type 2 diabetes mellitus A1c >12. Monitor on sliding scale insulin. CBG of 50 overnight. Hold Levemir and pre-meal aspart as patient is nothing by mouth. Received her dose of D50.    CAD (coronary artery disease) Stable. Continue statin   PAD (peripheral artery disease) (HCC) Hold aspirin and Plavix.  Continue statin    GERD (gastroesophageal reflux disease) Placed on IV Protonix.    ESRD on dialysis ([place]) . Makes some urine.renal informed. Due for dialysis today. (M, W, F).  Continue PhosLo.  History of CHF Euvolemic. Last 2-D echo with low normal EF.   1.   Code Status : Full code  Family Communication  : None at bedside today  Disposition Plan  : Home once workup completed in stable  Barriers For Discharge : Ongoing GI bleed,  pending GI evaluation and intervention  Consults  :   [facility]  Procedures  : None  DVT Prophylaxis  :  SCDs  Lab Results  Component Value Date   PLT 163 05/01/2015    Antibiotics  :  None  Anti-infectives    None        Objective:   Filed Vitals:   05/02/15 0036 05/02/15 0431 05/02/15 0849 05/02/15 0900  BP: 126/58 130/57 135/42 110/60  Pulse: 71 66 81 83  Temp: 99 F (37.2 C) 98.1 F (36.7 C) 98.4 F (36.9 C)   TempSrc: Oral Oral Oral   Resp:   18 16  Height:      Weight:  78.563 kg (173 lb 3.2 oz)    SpO2: 94% 100% 100% 93%    Wt Readings from Last 3 Encounters:  05/02/15 78.563 kg (173 lb 3.2 oz)  04/10/15 86.637 kg (191 lb)  03/11/15 86.637 kg (191 lb)     Intake/Output Summary (Last 24 hours) at 05/02/15 1149 Last data filed at 05/02/15 0837  Gross per 24 hour  Intake    270 ml  Output      0 ml  Net    270 ml     Physical Exam  Gen: Elderly male not in distress HEENT: no pallor, moist mucosa, ssupple neck Chest: clear b/l, no added sounds CVS: N S1&S2, no murmurs, rubs or gallop GI: soft, NT, ND, BS+ Musculoskeletal: Bilateral BKA, left sided AV fistula CNS: Alert and oriented    Data Review:    CBC  Recent Labs Lab 05/01/15 1613 05/01/15 2248 05/02/15 0336  WBC 9.2  --   --   HGB 7.9* 9.2* 8.8*  HCT 24.6* 28.8* 27.9*  PLT 163  --   --   MCV 95.7  --   --   MCH 30.7  --   --   MCHC 32.1  --   --   RDW 14.0  --   --   LYMPHSABS 2.0  --   --   MONOABS 0.4  --    --   EOSABS 0.1  --   --   BASOSABS 0.0  --   --     Chemistries   Recent Labs Lab 05/01/15 1613 05/02/15 0336  NA 138 138  K 4.9 3.3*  CL 96* 97*  CO2 26 26  GLUCOSE 380* 105*  BUN 35* 43*  CREATININE 5.40* 5.75*  CALCIUM 9.1 9.2  AST 15  --   ALT 10*  --   ALKPHOS 81  --   BILITOT 0.5  --    ------------------------------------------------------------------------------------------------------------------ No results for input(s): CHOL, HDL, LDLCALC, TRIG, CHOLHDL, LDLDIRECT in the last 72 hours.  Lab Results  Component Value Date   HGBA1C 12.3* 04/10/2015   ------------------------------------------------------------------------------------------------------------------ No results for input(s): TSH, T4TOTAL, T3FREE, THYROIDAB in the last 72 hours.  Invalid input(s): FREET3 ------------------------------------------------------------------------------------------------------------------ No results for input(s): VITAMINB12, FOLATE, FERRITIN, TIBC, IRON, RETICCTPCT in the last 72 hours.  Coagulation profile  Recent Labs Lab 05/01/15 1613  INR 1.06    No results for input(s): DDIMER in the last 72 hours.  Cardiac Enzymes No results for input(s): CKMB, TROPONINI, MYOGLOBIN in the last 168 hours.  Invalid input(s): CK ------------------------------------------------------------------------------------------------------------------ No results found for: BNP  Inpatient Medications  Scheduled Meds: . amLODipine  10 mg Oral Daily  . calcium acetate  667 mg Oral TID WC  . [START ON 05/07/2015] darbepoetin (ARANESP) injection - DIALYSIS  60 mcg Intravenous Q Wed-HD  . doxercalciferol  4 mcg Intravenous Q M,W,F-HD  . insulin aspart  0-15 Units Subcutaneous Q4H  . pantoprazole (PROTONIX) IV  40 mg Intravenous Q12H  . pravastatin  40 mg Oral Daily  . pregabalin  75 mg Oral Daily  . sodium chloride flush  3 mL Intravenous Q12H  . tamsulosin  0.4 mg Oral Daily    Continuous Infusions:  PRN Meds:.acetaminophen **OR** acetaminophen, HYDROcodone-acetaminophen, ondansetron **OR** ondansetron (ZOFRAN) IV  Micro Results Recent Results (from the past 240 hour(s))  MRSA PCR Screening     Status: None   Collection Time: 05/01/15  8:09 PM  Result Value Ref Range Status   MRSA by PCR NEGATIVE NEGATIVE Final    Comment:        The GeneXpert MRSA Assay (FDA approved for NASAL specimens only), is one component of a comprehensive MRSA colonization surveillance program. It is not intended to diagnose MRSA infection nor to guide or monitor treatment for MRSA infections.     Radiology Reports No results found.  Time Spent in minutes  25   Louellen Molder M.D on 05/02/2015 at 11:49 AM  Between 7am to 7pm - Pager - [phone]  After 7pm go to [email or web address] - password [facility]  [facility] -  Office  [phone]

## 2015-05-02 NOTE — Progress Notes (Signed)
Pt actively stooling still with GIB.  Labs/volume stable today.  Repeat AM labs and plan HD tomorrow - we will work around colonoscopy- transfuse prn on HD.

## 2015-05-02 NOTE — Consult Note (Signed)
Referring Provider: No ref. provider found Primary Care Physician:  Hollace Kinnier, DO Primary Gastroenterologist:  Althia Forts  Reason for Consultation:  Rectal bleeding  HPI: Aaron Long is a 75 y.o. male with a history of end-stage renal disease on dialysis (M/W/F), coronary artery disease,uncontrolled type 2 diabetes mellitus (A1c >12), diabetic neuropathy and peripheral vascular disease with bilateral BKAs, wheelchair-bound, history of CHF (last EF from September 2016 of 50-50% with grade 1 diastolic dysfunction), CAD with history of NSTEMI, s/p CABG in 2007, history of TIA.  He presented to the ED with 2 day history of bleeding per rectum. Patient reports noticing bright red blood mixed with stool for 2 days prior to arrival. His daughter thought he may have had hemorrhoids and got him some Tucks pads to use.  Symptoms persisted and patient had 2 similar episodes of bright red blood mixed with stool yesterday. He had further 2 episodes today and family also noticed significant amount of bright red blood in his clothes and in the wheelchair so he was brought to the ED.  He denies NSAID use. He is on aspirin and Plavix at home, which have now been held (last doses 4/20). Denies prior history of GI bleed and his last EGD/colonoscopy was several years ago (10+ years ago in [place]).  Hgb was 7.9 grams on presentation (down from 12.1 grams just 3 weeks ago), which increased to 9.2 grams after 1 unit PRBC's.  Feels ok but had a couple more episodes of bleeding today.  No abdominal pain.  Past Medical History  Diagnosis Date  . PAD (peripheral artery disease) ([place])     a. R CEA 2007. b. Hx L BKA in 2013. c. s/p LE angioplasty in 2014. d. Hx R BKA in 2014.  . Gangrene of toe (HCC)     dry  . Neuromuscular disorder ([place])     diabetic neuropathy  . Hypertension   . Hyperlipidemia   . Peripheral neuropathy ([place])   . Constipation     takes Miralax daily  . Hemorrhoids   . Hx of colonic  polyps   . Coronary artery disease     a. s/p NSTEMI/CABGx4 in 2007 - LIMA-LAD, SVG-optional diagonal, SVVG-OM, SVG-dRCA. b. Nuc 05/2011: nonischemic.  [name] BPH (benign prostatic hypertrophy)   . Abnormality of gait   . DVT (deep venous thrombosis) ([place])     a. Lower extremity DVT in 2013.  [name] Hypertrophy of prostate without urinary obstruction and other lower urinary tract symptoms (LUTS)   . Disorder of bone and cartilage, unspecified   . Lower limb amputation, below knee   . Anemia   . Secondary hyperparathyroidism ([place])     Secondary Hyperpara- Thyroidism, Renal  . Ischemic cardiomyopathy     a. EF 40% in 2007. b. 51% by nuc 05/2011.  [name] Complication of anesthesia     "he gets delirious"  . Myocardial infarction ([place]) 2005  . Type II diabetes mellitus ([place])   . Diabetic peripheral neuropathy ([place])   . Prostate cancer ([place]) 2010  . ESRD on dialysis [facility])     M-W-F  . Kidney stones     Past Surgical History  Procedure Laterality Date  . Prostatectomy  2009  . Knee cartilage surgery Left 1964  . Coronary artery bypass graft  2005    CABG X4  . Carotid endarterectomy Right 2005  . Cataract extraction w/ intraocular lens  implant, bilateral Bilateral 2011  . Colonoscopy    . Cardiac catheterization  05/28/11  . Amputation  06/14/2011    Procedure: AMPUTATION DIGIT;  Surgeon: Elam Dutch, MD;  Location[facility] OR;  Service: Vascular;  Laterality: Left;  Amputation Left fifth toe  . Amputation  06/16/2011    Procedure: AMPUTATION BELOW KNEE;  Surgeon: Elam Dutch, MD;  Location: [place];  Service: Vascular;  Laterality: Left;  . I&d extremity  01/31/2012    Procedure: IRRIGATION AND DEBRIDEMENT EXTREMITY;  Surgeon: Elam Dutch, MD;  Location: [place];  Service: Vascular;  Laterality: Left;  I & D Left BKA   . Amputation Right 06/12/2012    Procedure: AMPUTATION DIGIT;  Surgeon: Elam Dutch, MD;  Location: [facility] OR;  Service: Vascular;  Laterality: Right;  GREAT TOE  . Amputation  Right 08/08/2012    Procedure: AMPUTATION BELOW KNEE;  Surgeon: Elam Dutch, MD;  Location: [facility] OR;  Service: Vascular;  Laterality: Right;  . Abdominal aortagram N/A 05/28/2011    Procedure: ABDOMINAL Maxcine Ham;  Surgeon: Elam Dutch, MD;  Location: [facility] CATH LAB;  Service: Cardiovascular;  Laterality: N/A;  . Abdominal aortagram N/A 06/02/2012    Procedure: ABDOMINAL Maxcine Ham;  Surgeon: Elam Dutch, MD;  Location: [facility] CATH LAB;  Service: Cardiovascular;  Laterality: N/A;  . Abdominal aortagram N/A 06/09/2012    Procedure: ABDOMINAL Maxcine Ham;  Surgeon: Elam Dutch, MD;  Location: [facility] C[facility] LAB;  Service: Cardiovascular;  Laterality: N/A;  . Insertion of dialysis catheter Right 04/19/2014    Procedure: INSERTION OF DIALYSIS CATHETER-INTERNAL JUGULAR;  Surgeon: Mal Misty, MD;  Location: [place];  Service: Vascular;  Laterality: Right;  . Av fistula placement Right 04/19/2014    Procedure: RIGHT ARM ARTERIOVENOUS (AV) FISTULA CREATION;  Surgeon: Mal Misty, MD;  Location: [place];  Service: Vascular;  Laterality: Right;  . Ligation of competing branches of arteriovenous fistula Right 06/19/2014    Procedure: Right Arm LIGATION OF COMPETING BRANCHES OF RADIOCEPHALIC ARTERIOVENOUS FISTULA;  Surgeon: Mal Misty, MD;  Location: [place];  Service: Vascular;  Laterality: Right;  . Cardiac catheterization N/A 10/04/2014    Procedure: Left Heart Cath and Coronary Angiography;  Surgeon: Sherren Mocha, MD;  Location: [place] CV LAB;  Service: Cardiovascular;  Laterality: N/A;  . Endarterectomy Left 10/17/2014    Procedure: ENDARTERECTOMY CAROTID;  Surgeon: Mal Misty, MD;  Location: [place];  Service: Vascular;  Laterality: Left;  . Ligation of arteriovenous  fistula Right 11/06/2014    Procedure: LIGATION OF RADIOCEPHALIC ARTERIOVENOUS FISTULA;  Surgeon: Mal Misty, MD;  Location: [place];  Service: Vascular;  Laterality: Right;  . Insertion of dialysis catheter N/A 11/06/2014     Procedure: INSERTION OF 23cm DIALYSIS CATHETER - right internal jugular;  Surgeon: Mal Misty, MD;  Location: [place];  Service: Vascular;  Laterality: N/A;  . Av fistula placement Left 12/12/2014    Procedure: BRACHIOCEPHALIC ARTERIOVENOUS (AV) FISTULA CREATION;  Surgeon: Mal Misty, MD;  Location: [place];  Service: Vascular;  Laterality: Left;    Prior to Admission medications   Medication Sig Start Date End Date Taking? Authorizing Provider  amLODipine (NORVASC) 10 MG tablet Take 1 tablet (10 mg total) by mouth daily. 04/28/15  Yes Tiffany L Reed, DO  aspirin EC 81 MG tablet Take 1 tablet (81 mg total) by mouth every morning. 04/28/15  Yes Tiffany L Reed, DO  calcium acetate (PHOSLO) 667 MG capsule Take 1 capsule (667 mg total) by mouth 3 (three) times daily with meals. 09/14/14  Yes Florencia Reasons, MD  clopidogrel (PLAVIX) 75 MG tablet Take 1 tablet (75 mg total) by mouth daily. 04/28/15  Yes Tiffany L Reed, DO  HYDROcodone-acetaminophen (NORCO/VICODIN) 5-325 MG tablet Take 1 tablet by mouth every 6 (six) hours as needed for moderate pain. 04/10/15  Yes Tiffany L Reed, DO  insulin aspart (NOVOLOG) 100 UNIT/ML FlexPen Inject 5 Units into the skin 3 (three) times daily with meals. Inject 5 units three times a day with meals if blood sugar greater than 150 only, DX: 250.71 04/28/15  Yes Tiffany L Reed, DO  Insulin Detemir (LEVEMIR) 100 UNIT/ML Pen Inject 17 Units into the skin 2 (two) times daily. 04/28/15  Yes Tiffany L Reed, DO  pravastatin (PRAVACHOL) 40 MG tablet Take 1 tablet (40 mg total) by mouth daily. 04/28/15  Yes Tiffany L Reed, DO  pregabalin (LYRICA) 75 MG capsule Take one tablet by mouth once daily for pains 01/09/15  Yes [name], NP  tamsulosin (FLOMAX) 0.4 MG CAPS capsule Take 1 capsule (0.4 mg total) by mouth daily. 04/28/15  Yes Tiffany L Reed, DO  AMBULATORY NON FORMULARY MEDICATION BD U/P Mini Pen [place] 31GX5MM Use three times daily as directed DX: E11.9 04/28/15   Tiffany L  Reed, DO  AMBULATORY NON FORMULARY MEDICATION One Touch Ultra 2 Test Strips Sig:  Check blood sugars Three times daily to keep blood sugars under control Dx: E11.9 04/28/15   Tiffany L Reed, DO  QUEtiapine (SEROQUEL) 25 MG tablet Take 1 tablet (25 mg total) by mouth daily. Patient not taking: Reported on 05/01/2015 04/28/15   Tiffany L Reed, DO  senna-docusate (SENOKOT-S) 8.6-50 MG tablet Take 2 tablets by mouth 2 (two) times daily. Patient not taking: Reported on 05/01/2015 01/09/15   [name], NP    Current Facility-Administered Medications  Medication Dose Route Frequency Provider Last Rate Last Dose  . acetaminophen (TYLENOL) tablet 650 mg  650 mg Oral Q6H PRN Nishant Dhungel, MD       Or  . acetaminophen (TYLENOL) suppository 650 mg  650 mg Rectal Q6H PRN Nishant Dhungel, MD      . amLODipine (NORVASC) tablet 10 mg  10 mg Oral Daily Nishant Dhungel, MD      . calcium acetate (PHOSLO) capsule 667 mg  667 mg Oral TID WC Nishant Dhungel, MD   667 mg at 05/02/15 0900  . doxercalciferol (HECTOROL) injection 4 mcg  4 mcg Intravenous Q M,W,F-HD Alric Seton, PA-C      . HYDROcodone-acetaminophen (NORCO/VICODIN) 5-325 MG per tablet 1 tablet  1 tablet Oral Q6H PRN Nishant Dhungel, MD      . insulin aspart (novoLOG) injection 0-15 Units  0-15 Units Subcutaneous Q4H Nishant Dhungel, MD   3 Units at 05/02/15 0137  . ondansetron (ZOFRAN) tablet 4 mg  4 mg Oral Q6H PRN Nishant Dhungel, MD       Or  . ondansetron (ZOFRAN) injection 4 mg  4 mg Intravenous Q6H PRN Nishant Dhungel, MD      . pantoprazole (PROTONIX) injection 40 mg  40 mg Intravenous Q12H Nishant Dhungel, MD   40 mg at 05/01/15 2243  . pravastatin (PRAVACHOL) tablet 40 mg  40 mg Oral Daily Nishant Dhungel, MD      . pregabalin (LYRICA) capsule 75 mg  75 mg Oral Daily Nishant Dhungel, MD      . sodium chloride flush (NS) 0.9 % injection 3 mL  3 mL Intravenous Q12H Nishant Dhungel, MD   3 mL at  05/01/15 2200  . tamsulosin (FLOMAX)  capsule 0.4 mg  0.4 mg Oral Daily Nishant Dhungel, MD        Allergies as of 05/01/2015 - Review Complete 05/01/2015  Allergen Reaction Noted  . Oxycodone Other (See Comments) 08/08/2012    Family History  Problem Relation Age of Onset  . Coronary artery disease Neg Hx   . Anesthesia problems Neg Hx   . Hypotension Neg Hx   . Malignant hyperthermia Neg Hx   . Pseudochol deficiency Neg Hx   . Hyperlipidemia Mother   . Hypertension Mother   . Cancer Mother   . Hyperlipidemia Father   . Hypertension Father   . Kidney disease Father   . Heart disease Sister   . Alzheimer's disease Sister   . Cancer Brother   . Other Sister     Social History   Social History  . Marital Status: Married    Spouse Name: N/A  . Number of Children: 3  . Years of Education: N/A   Occupational History  . Retired-[employer]    Social History Main Topics  . Smoking status: Former Smoker -- 1.00 packs/day for 30 years    Types: Cigarettes    Quit date: 04/28/1991  . Smokeless tobacco: Never Used  . Alcohol Use: Yes     Comment: 04/17/2014 "might have a glass of wine at Christmas"  . Drug Use: Yes    Special: Marijuana  . Sexual Activity: No   Other Topics Concern  . Not on file   Social History Narrative    Review of Systems: Ten point ROS is O/W negative except as mentioned in HPI.  Physical Exam: Vital signs in last 24 hours: Temp:  [98.1 F (36.7 C)-99 F (37.2 C)] 98.4 F (36.9 C) (04/21 0849) Pulse Rate:  [66-83] 83 (04/21 0900) Resp:  [11-19] 16 (04/21 0900) BP: (110-168)/(42-87) 110/60 mmHg (04/21 0900) SpO2:  [93 %-100 %] 93 % (04/21 0900) Weight:  [168 lb 3.2 oz (76.295 kg)-173 lb 3.2 oz (78.563 kg)] 173 lb 3.2 oz (78.563 kg) (04/21 0431) Last BM Date: 04/30/15 General:  Alert, chronically ill-appearing, pleasant and cooperative in NAD Head:  Normocephalic and atraumatic. Eyes:  Sclera clear, no icterus.  Conjunctiva pink. Ears:  Normal auditory  acuity. Mouth:  No deformity or lesions.   Lungs:  Clear throughout to auscultation.  No wheezes, crackles, or rhonchi.  Heart:  Regular rate and rhythm; no murmurs, clicks, rubs, or gallops. Abdomen:  Soft, non-distended.  BS present.  Non-tender. Rectal:  Deferred.  Will be done at the time of colonoscopy.  Msk:  Symmetrical without gross deformities.   Pulses:  Normal pulses noted. Extremities:  Without clubbing or edema.  B/L BKA. Neurologic:  Alert and oriented x 4;  grossly normal neurologically. Skin:  Intact without significant lesions or rashes. Psych:  Alert and cooperative. Normal mood and affect.   Lab Results:  Recent Labs  05/01/15 1613 05/01/15 2248 05/02/15 0336  WBC 9.2  --   --   HGB 7.9* 9.2* 8.8*  HCT 24.6* 28.8* 27.9*  PLT 163  --   --    BMET  Recent Labs  05/01/15 1613 05/02/15 0336  NA 138 138  K 4.9 3.3*  CL 96* 97*  CO2 26 26  GLUCOSE 380* 105*  BUN 35* 43*  CREATININE 5.40* 5.75*  CALCIUM 9.1 9.2   LFT  Recent Labs  05/01/15 1613  PROT 6.3*  ALBUMIN 3.1*  AST  15  ALT 10*  ALKPHOS 81  BILITOT 0.5   PT/INR  Recent Labs  05/01/15 1613  LABPROT 14.0  INR 1.06   IMPRESSION:  -Rectal bleeding:  ? Diverticular vs other etiologies.  No colonoscopy in 10+ years. -Acute on chronic anemia:  Acutely secondary to blood loss.  Hgb 12.1 grams 3 weeks ago then 7.9 grams on admission then 9.2 grams post 1 unit PRBC's. -CKD on HD MWF -Uncontrolled DM 2 -CAD/PVD with B/L BKA:  Was on Plavix, which is now being held (last dose 4/20).  PLAN: -For colonoscopy 4/22.  Will allow clear liquids today then NPO after midnight except for AM prep dose. -Will discuss with renal and see if they can dialyze him today. -Monitor Hgb and transfuse further prn.  ZEHR, JESSICA D.  05/02/2015, 10:54 AM  Pager number [phone]          Clinical scenario suggests lower GI bleed from colonic diverticulosis. Has acute blood loss anemia from  this  Colonoscopy tomorrow around 930-10  Gatha Mayer, MD, [facility] [phone] (pager) [phone] after 5 PM, weekends and holidays  05/02/2015 5:35 PM

## 2015-05-03 ENCOUNTER — Encounter (HOSPITAL_COMMUNITY): Payer: Self-pay | Admitting: Internal Medicine

## 2015-05-03 ENCOUNTER — Encounter (HOSPITAL_COMMUNITY)
Admission: EM | Disposition: A | Discharge status: Home Health | Payer: Self-pay | Source: Home / Self Care | Attending: Internal Medicine

## 2015-05-03 DIAGNOSIS — K625 Hemorrhage of anus and rectum: Secondary | ICD-10-CM

## 2015-05-03 DIAGNOSIS — K6381 Dieulafoy lesion of intestine: Secondary | ICD-10-CM

## 2015-05-03 HISTORY — PX: COLONOSCOPY: SHX5424

## 2015-05-03 HISTORY — DX: Dieulafoy lesion of intestine: K63.81

## 2015-05-03 LAB — CBC
HCT: 23.8 % — ABNORMAL LOW (ref 39.0–52.0)
Hemoglobin: 7.5 g/dL — ABNORMAL LOW (ref 13.0–17.0)
MCH: 29.1 pg (ref 26.0–34.0)
MCHC: 31.5 g/dL (ref 30.0–36.0)
MCV: 92.2 fL (ref 78.0–100.0)
Platelets: 199 10*3/uL (ref 150–400)
RBC: 2.58 MIL/uL — ABNORMAL LOW (ref 4.22–5.81)
RDW: 16.8 % — ABNORMAL HIGH (ref 11.5–15.5)
WBC: 9 10*3/uL (ref 4.0–10.5)

## 2015-05-03 LAB — BASIC METABOLIC PANEL
Anion gap: 17 — ABNORMAL HIGH (ref 5–15)
BUN: 58 mg/dL — AB (ref 6–20)
CHLORIDE: 95 mmol/L — AB (ref 101–111)
CO2: 22 mmol/L (ref 22–32)
CREATININE: 6.45 mg/dL — AB (ref 0.61–1.24)
Calcium: 9 mg/dL (ref 8.9–10.3)
GFR calc Af Amer: 9 mL/min — ABNORMAL LOW (ref >60)
GFR calc non Af Amer: 8 mL/min — ABNORMAL LOW (ref >60)
GLUCOSE: 156 mg/dL — AB (ref 65–99)
Potassium: 4.5 mmol/L (ref 3.5–5.1)
Sodium: 134 mmol/L — ABNORMAL LOW (ref 135–145)

## 2015-05-03 LAB — PREPARE RBC (CROSSMATCH)

## 2015-05-03 LAB — GLUCOSE, CAPILLARY
Glucose-Capillary: 122 mg/dL — ABNORMAL HIGH (ref 65–99)
Glucose-Capillary: 127 mg/dL — ABNORMAL HIGH (ref 65–99)
Glucose-Capillary: 174 mg/dL — ABNORMAL HIGH (ref 65–99)
Glucose-Capillary: 187 mg/dL — ABNORMAL HIGH (ref 65–99)
Glucose-Capillary: 187 mg/dL — ABNORMAL HIGH (ref 65–99)
Glucose-Capillary: 228 mg/dL — ABNORMAL HIGH (ref 65–99)

## 2015-05-03 LAB — HEMOGLOBIN AND HEMATOCRIT, BLOOD
HCT: 28.7 % — ABNORMAL LOW (ref 39.0–52.0)
HEMOGLOBIN: 9.3 g/dL — AB (ref 13.0–17.0)

## 2015-05-03 SURGERY — COLONOSCOPY
Anesthesia: Moderate Sedation

## 2015-05-03 MED ORDER — LIDOCAINE-PRILOCAINE 2.5-2.5 % EX CREA: 1.0000 "application " | TOPICAL_CREAM | CUTANEOUS | Status: DC | PRN

## 2015-05-03 MED ORDER — HEPARIN SODIUM (PORCINE) 1000 UNIT/ML DIALYSIS: 1000.0000 [IU] | INTRAMUSCULAR | Status: DC | PRN

## 2015-05-03 MED ORDER — LIDOCAINE HCL (PF) 1 % IJ SOLN: 5.0000 mL | INTRAMUSCULAR | Status: DC | PRN

## 2015-05-03 MED ORDER — SODIUM CHLORIDE 0.9 % IV SOLN: 100.0000 mL | INTRAVENOUS | Status: DC | PRN

## 2015-05-03 MED ORDER — DOXERCALCIFEROL 4 MCG/2ML IV SOLN
INTRAVENOUS | Status: AC
  Administered 2015-05-03: 4 ug via INTRAVENOUS
  Filled 2015-05-03: qty 2

## 2015-05-03 MED ORDER — FENTANYL CITRATE (PF) 100 MCG/2ML IJ SOLN
INTRAMUSCULAR | Status: DC | PRN
  Administered 2015-05-03: 25 ug via INTRAVENOUS

## 2015-05-03 MED ORDER — SODIUM CHLORIDE 0.9 % IV SOLN: Freq: Once | INTRAVENOUS | Status: DC

## 2015-05-03 MED ORDER — MIDAZOLAM HCL 5 MG/5ML IJ SOLN
INTRAMUSCULAR | Status: DC | PRN
  Administered 2015-05-03: 2 mg via INTRAVENOUS

## 2015-05-03 MED ORDER — ALTEPLASE 2 MG IJ SOLR: 2.0000 mg | Freq: Once | INTRAMUSCULAR | Status: DC | PRN

## 2015-05-03 MED ORDER — PENTAFLUOROPROP-TETRAFLUOROETH EX AERO: 1.0000 "application " | INHALATION_SPRAY | CUTANEOUS | Status: DC | PRN

## 2015-05-03 NOTE — Progress Notes (Signed)
  [facility] Progress Note   Subjective: stable   Filed Vitals:   05/03/15 1205 05/03/15 1227 05/03/15 1308 05/03/15 1315  BP: 116/47 111/43 126/58 146/68  Pulse: 73 78 75 75  Temp:  98.4 F (36.9 C)    TempSrc:      Resp: 15 16 16 12   Height:      Weight:      SpO2: 99% 99%      Inpatient medications: . sodium chloride   Intravenous Once  . amLODipine  10 mg Oral Daily  . calcium acetate  667 mg Oral TID WC  . [START ON 05/07/2015] darbepoetin (ARANESP) injection - DIALYSIS  60 mcg Intravenous Q Wed-HD  . doxercalciferol  4 mcg Intravenous Q M,W,F-HD  . insulin aspart  0-15 Units Subcutaneous Q4H  . pantoprazole (PROTONIX) IV  40 mg Intravenous Q12H  . pravastatin  40 mg Oral Daily  . pregabalin  75 mg Oral Daily  . sodium chloride flush  3 mL Intravenous Q12H  . tamsulosin  0.4 mg Oral Daily     acetaminophen **OR** acetaminophen, HYDROcodone-acetaminophen, ondansetron **OR** ondansetron (ZOFRAN) IV  Exam: Alert, elderly AAM no distress, calm No jvd Chest clear bilat RRR no mrg Abd soft ntnd obese bilat BKA no edema Neuro is alert, nf L arm AVF +bruit  Dialysis: MWF    4h 57min  R IJ/ L arm AVF (using AVF, cath for removal 4/22 ckv) 2/2.25 bath Hep 8500 Hect 4 Mircera 50 q 2 pth 521      Assessment: 1 GIB acute, lower 2 Anemia sp prbc's 3 ESRD  4 MBD 5 CAD hx nstemi/ CEA/ bilat BKA 6 DM per prim    Plan - HD today after colon procedure   Kelly Splinter MD [facility] pager [phone]    cell [phone] 05/03/2015, 2:20 PM    Recent Labs Lab 05/01/15 1613 05/02/15 0336 05/03/15 0306  NA 138 138 134*  K 4.9 3.3* 4.5  CL 96* 97* 95*  CO2 26 26 22   GLUCOSE 380* 105* 156*  BUN 35* 43* 58*  CREATININE 5.40* 5.75* 6.45*  CALCIUM 9.1 9.2 9.0    Recent Labs Lab 05/01/15 1613  AST 15  ALT 10*  ALKPHOS 81  BILITOT 0.5  PROT 6.3*  ALBUMIN 3.1*    Recent Labs Lab 05/01/15 1613  05/02/15 0336  05/02/15 1137 05/03/15 0306  WBC 9.2  --   --   --  9.0  NEUTROABS 6.6  --   --   --   --   HGB 7.9*  < > 8.8* 8.4* 7.5*  HCT 24.6*  < > 27.9* 26.2* 23.8*  MCV 95.7  --   --   --  92.2  PLT 163  --   --   --  199  < > = values in this interval not displayed.

## 2015-05-03 NOTE — Op Note (Signed)
[facility] Patient Name: Aaron Long Procedure Date : 05/03/2015 MRN: [id number] Attending MD: Gatha Mayer , MD Date of Birth: 1940-12-09 CSN: [id number] Age: 75 Admit Type: Inpatient Procedure:                Colonoscopy Indications:              Evaluation of unexplained GI bleeding Providers:                Gatha Mayer, MD, Hilma Favors, RN, Alfonso Patten,                            Technician Referring MD:              Medicines:                Midazolam 2 mg IV, Fentanyl 25 micrograms IV Complications:            No immediate complications. Estimated Blood Loss:     Estimated blood loss was minimal. Procedure:                Pre-Anesthesia Assessment:                           - Prior to the procedure, a History and Physical                            was performed, and patient medications and                            allergies were reviewed. [name] tolerance of                            previous anesthesia was also reviewed. The risks                            and benefits of the procedure and the sedation                            options and risks were discussed with the patient.                            All questions were answered, and informed consent                            was obtained. Prior Anticoagulants: The patient                            last took aspirin 2 days and Plavix (clopidogrel) 2                            days prior to the procedure. ASA Grade Assessment:                            III - A patient with severe systemic disease. After  reviewing the risks and benefits, the patient was                            deemed in satisfactory condition to undergo the                            procedure.                           After obtaining informed consent, the colonoscope                            was passed under direct vision. Throughout the                            procedure, the  patient's blood pressure, pulse, and                            oxygen saturations were monitored continuously. The                            [id number]) scope was introduced through                            the anus and advanced to the the cecum, identified                            by appendiceal orifice and ileocecal valve. The                            colonoscopy was performed without difficulty. The                            patient tolerated the procedure well. The quality                            of the bowel preparation was good. The bowel                            preparation used was MoviPrep. The ileocecal valve,                            appendiceal orifice, and rectum were photographed. Scope In: 11:33:55 AM Scope Out: 11:48:32 AM Scope Withdrawal Time: 0 hours 8 minutes 1 second  Total Procedure Duration: 0 hours 14 minutes 37 seconds  Findings:      The perianal and digital rectal examinations were normal.      A Dieulafoy's lesion was seen in the rectum and was oozing blood. To       stop active bleeding, two hemostatic clips were successfully placed (MR       conditional). There was no bleeding at the end of the procedure.       Estimated blood loss was minimal.      The exam was otherwise without abnormality on direct and retroflexion  views. Impression:               - A Dieulafoy's lesion was seen in rectum and                            treated.                           - The examination was otherwise normal on direct                            and retroflexion views. Moderate Sedation:      Moderate (conscious) sedation was administered by the endoscopy nurse       and supervised by the endoscopist. The following parameters were       monitored: oxygen saturation, heart rate, blood pressure, and response       to care. Total physician intraservice time was 20 minutes. Recommendation:           - Return patient to hospital ward for ongoing  care.                           - Resume previous diet.                           - Resume aspirin tomorrow and Plavix (clopidogrel)                            in 1 week at prior doses.                           - No routine repeat colonoscopy due to age. Procedure Code(s):        --- Professional ---                           [phone], Colonoscopy, flexible; with control of                            bleeding, any method Diagnosis Code(s):        --- Professional ---                           K63.81, Dieulafoy lesion of intestine                           K92.2, Gastrointestinal hemorrhage, unspecified CPT copyright 2016 American Medical Association. All rights reserved. The codes documented in this report are preliminary and upon coder review may  be revised to meet current compliance requirements. Gatha Mayer, MD 05/03/2015 12:23:20 PM This report has been signed electronically. Number of Addenda: 0

## 2015-05-03 NOTE — Progress Notes (Signed)
PROGRESS NOTE                                                                                                                                                                                                             Patient Demographics:    Aaron Long, is a 75 y.o. male, DOB - 1940/10/24, CK:[id number]  Admit date - 05/01/2015   Admitting Physician Louellen Molder, MD  Outpatient Primary MD for the patient is REED, TIFFANY, DO  LOS - 2  Outpatient Specialists:Nephrology  Chief Complaint  Patient presents with  . Blood In Stools       Brief Narrative   75 year old male with multiple comorbidities including ESRD on dialysis, CAD with history of CABG, uncontrolled type 2 diabetes mellitus (A1c >12) with diabetic neuropathy and peripheral vascular disease status post bilateral BKA's, wheelchair-bound, history of CHF, TIA presented to the ED with 2 day history of bright red blood per rectum occasionally mixed with stool. Patient found to have significant drop in his H&H (7.9 from baseline of 12-13 recently). Admitted to hospitalist service, GI and nephrology consulted.   Subjective:   Patient reports 3 episodes of bright red blood mixed with stool yesterday.    Assessment  & Plan :   Principal Problem:  Rectal bleeding with acute blood anemia  Suspect lower GI bleed. 1 unit PRBC on admission. Hb again dropped to 7.5gm/dl. Ordered another unit with HD today. Monitor H&H closely. IV Protonix twice daily. Aspirin and Plavix on hold. Scheduled for colonoscopy today.   Active Problems: Uncontrolled type 2 diabetes mellitus A1c >12. Monitor on sliding scale insulin alone as pt NPO.     CAD (coronary artery disease) Stable. Continue statin   PAD (peripheral artery disease) (HCC) Hold aspirin and Plavix. Continue statin    GERD (gastroesophageal reflux disease) on IV Protonix.    ESRD on dialysis  ([place]) . Makes some urine. renal following. Due for dialysis today. (M, W, F).  HD planned for today. Continue PhosLo.  History of CHF Euvolemic. Last 2-D echo with low normal EF.      Code Status : Full code  Family Communication  : None at bedside   Disposition Plan  : Home once workup completed in stable  Barriers For Discharge : Ongoing GI bleed, pending colonoscopy  Consults  :   [facility]rology  Procedures  : None  DVT Prophylaxis  :  SCDs  Lab Results  Component Value Date   PLT 199 05/03/2015    Antibiotics  :  None  Anti-infectives    None        Objective:   Filed Vitals:   05/02/15 1159 05/02/15 1638 05/02/15 2033 05/03/15 0424  BP: 155/55 117/53 132/55 154/65  Pulse: 82 79 80 91  Temp: 98.6 F (37 C) 98.5 F (36.9 C) 98.6 F (37 C) 98.7 F (37.1 C)  TempSrc: Oral Oral Oral Oral  Resp: 16 18 18 18   Height:      Weight:    79.516 kg (175 lb 4.8 oz)  SpO2: 100% 99% 100% 96%    Wt Readings from Last 3 Encounters:  05/03/15 79.516 kg (175 lb 4.8 oz)  04/10/15 86.637 kg (191 lb)  03/11/15 86.637 kg (191 lb)     Intake/Output Summary (Last 24 hours) at 05/03/15 0830 Last data filed at 05/03/15 0422  Gross per 24 hour  Intake    720 ml  Output    925 ml  Net   -205 ml     Physical Exam  Gen: ot in distress HEENT: no pallor, moist mucosa, supple neck Chest: clear b/l, no added sounds CVS: N S1&S2, no murmurs GI: soft, NT, ND, BS+ Musculoskeletal: Bilateral BKA, left sided AV fistula CNS: Alert and oriented    Data Review:    CBC  Recent Labs Lab 05/01/15 1613 05/01/15 2248 05/02/15 0336 05/02/15 1137 05/03/15 0306  WBC 9.2  --   --   --  9.0  HGB 7.9* 9.2* 8.8* 8.4* 7.5*  HCT 24.6* 28.8* 27.9* 26.2* 23.8*  PLT 163  --   --   --  199  MCV 95.7  --   --   --  92.2  MCH 30.7  --   --   --  29.1  MCHC 32.1  --   --   --  31.5  RDW 14.0  --   --   --  16.8*  LYMPHSABS 2.0  --   --   --   --   MONOABS 0.4  --    --   --   --   EOSABS 0.1  --   --   --   --   BASOSABS 0.0  --   --   --   --     Chemistries   Recent Labs Lab 05/01/15 1613 05/02/15 0336 05/03/15 0306  NA 138 138 134*  K 4.9 3.3* 4.5  CL 96* 97* 95*  CO2 26 26 22   GLUCOSE 380* 105* 156*  BUN 35* 43* 58*  CREATININE 5.40* 5.75* 6.45*  CALCIUM 9.1 9.2 9.0  AST 15  --   --   ALT 10*  --   --   ALKPHOS 81  --   --   BILITOT 0.5  --   --    ------------------------------------------------------------------------------------------------------------------ No results for input(s): CHOL, HDL, LDLCALC, TRIG, CHOLHDL, LDLDIRECT in the last 72 hours.  Lab Results  Component Value Date   HGBA1C 12.3* 04/10/2015   ------------------------------------------------------------------------------------------------------------------ No results for input(s): TSH, T4TOTAL, T3FREE, THYROIDAB in the last 72 hours.  Invalid input(s): FREET3 ------------------------------------------------------------------------------------------------------------------ No results for input(s): VITAMINB12, FOLATE, FERRITIN, TIBC, IRON, RETICCTPCT in the last 72 hours.  Coagulation profile  Recent Labs Lab 05/01/15 1613  INR 1.06    No results for input(s): DDIMER in the last  72 hours.  Cardiac Enzymes No results for input(s): CKMB, TROPONINI, MYOGLOBIN in the last 168 hours.  Invalid input(s): CK ------------------------------------------------------------------------------------------------------------------ No results found for: BNP  Inpatient Medications  Scheduled Meds: . sodium chloride   Intravenous Once  . amLODipine  10 mg Oral Daily  . calcium acetate  667 mg Oral TID WC  . [START ON 05/07/2015] darbepoetin (ARANESP) injection - DIALYSIS  60 mcg Intravenous Q Wed-HD  . doxercalciferol  4 mcg Intravenous Q M,W,F-HD  . insulin aspart  0-15 Units Subcutaneous Q4H  . pantoprazole (PROTONIX) IV  40 mg Intravenous Q12H  . pravastatin   40 mg Oral Daily  . pregabalin  75 mg Oral Daily  . sodium chloride flush  3 mL Intravenous Q12H  . tamsulosin  0.4 mg Oral Daily   Continuous Infusions: . sodium chloride     PRN Meds:.acetaminophen **OR** acetaminophen, HYDROcodone-acetaminophen, ondansetron **OR** ondansetron (ZOFRAN) IV  Micro Results Recent Results (from the past 240 hour(s))  MRSA PCR Screening     Status: None   Collection Time: 05/01/15  8:09 PM  Result Value Ref Range Status   MRSA by PCR NEGATIVE NEGATIVE Final    Comment:        The GeneXpert MRSA Assay (FDA approved for NASAL specimens only), is one component of a comprehensive MRSA colonization surveillance program. It is not intended to diagnose MRSA infection nor to guide or monitor treatment for MRSA infections.     Radiology Reports No results found.  Time Spent in minutes  25   Louellen Molder M.D on 05/03/2015 at 8:30 AM  Between 7am to 7pm - Pager - [phone]  After 7pm go to [email or web address] - password [facility]  [facility] -  Office  [phone]

## 2015-05-04 LAB — TYPE AND SCREEN
ABO/RH(D): O POS
ANTIBODY SCREEN: NEGATIVE
UNIT DIVISION: 0
Unit division: 0

## 2015-05-04 LAB — GLUCOSE, CAPILLARY
GLUCOSE-CAPILLARY: 134 mg/dL — AB (ref 65–99)
GLUCOSE-CAPILLARY: 160 mg/dL — AB (ref 65–99)
GLUCOSE-CAPILLARY: 229 mg/dL — AB (ref 65–99)
GLUCOSE-CAPILLARY: 249 mg/dL — AB (ref 65–99)
Glucose-Capillary: 245 mg/dL — ABNORMAL HIGH (ref 65–99)
Glucose-Capillary: 278 mg/dL — ABNORMAL HIGH (ref 65–99)

## 2015-05-04 LAB — HEMOGLOBIN AND HEMATOCRIT, BLOOD
HEMATOCRIT: 26.2 % — AB (ref 39.0–52.0)
HEMOGLOBIN: 8.6 g/dL — AB (ref 13.0–17.0)

## 2015-05-04 MED ORDER — INSULIN ASPART 100 UNIT/ML ~~LOC~~ SOLN
0.0000 [IU] | Freq: Three times a day (TID) | SUBCUTANEOUS | Status: DC
  Administered 2015-05-04: 5 [IU] via SUBCUTANEOUS
  Administered 2015-05-04: 8 [IU] via SUBCUTANEOUS

## 2015-05-04 MED ORDER — INSULIN DETEMIR 100 UNIT/ML ~~LOC~~ SOLN
17.0000 [IU] | Freq: Two times a day (BID) | SUBCUTANEOUS | Status: DC
  Administered 2015-05-04 – 2015-05-05 (×3): 17 [IU] via SUBCUTANEOUS
  Filled 2015-05-04 (×4): qty 0.17

## 2015-05-04 NOTE — Progress Notes (Signed)
MD could u change pt to ACHS S/S insulin instead of q 4hr, pt is eating again and CBG's are in his baseline, thanks Buckner [place]

## 2015-05-04 NOTE — Progress Notes (Signed)
PROGRESS NOTE                                                                                                                                                                                                             Patient Demographics:    Aaron Long, is a 75 y.o. male, DOB - 1940/11/29, [id number]  Admit date - 05/01/2015   Admitting Physician Louellen Molder, MD  Outpatient Primary MD for the patient is REED, TIFFANY, DO  LOS - 3  Outpatient Specialists:Nephrology  Chief Complaint  Patient presents with  . Blood In Stools       Brief Narrative   75 year old male with multiple comorbidities including ESRD on dialysis, CAD with history of CABG, uncontrolled type 2 diabetes mellitus (A1c >12) with diabetic neuropathy and peripheral vascular disease status post bilateral BKA's, wheelchair-bound, history of CHF, TIA presented to the ED with 2 day history of bright red blood per rectum occasionally mixed with stool. Patient found to have significant drop in his H&H (7.9 from baseline of 12-13 recently). Admitted to hospitalist service, GI and nephrology consulted. Patient underwent colonoscopy showing 2 bleeding dieulafoy  lesion in the rectum that were clipped.   Subjective:  No further bleeding since the colonoscopy   Assessment  & Plan :   Principal Problem:  Rectal bleeding with acute blood anemia Secondary to bleeding dieulafoy's lesion in the rectum that were treated with 2 endoclips. No further rectal bleed. Received 2 units PRBC so far. H&H stable. Aspirin and Plavix on hold. Discussed with Dr. Carlean Purl recommended okay to start aspirin but hold Plavix for a week (start on 4/29) Monitor for further bleeding. Appreciate GI evaluation.   Active Problems: Uncontrolled type 2 diabetes mellitus A1c >12. Monitor on sliding scale insulin alone as pt NPO.     CAD (coronary artery disease) Stable.  Continue statin   PAD (peripheral artery disease) (HCC) Holding  aspirin and Plavix. Resume aspirin upon discharge. Start Plavix from 4/29. Continue statin.    GERD (gastroesophageal reflux disease) on IV Protonix.    ESRD on dialysis ([place]) . Makes some urine. renal following. Getting dialyzed here. Continue PhosLo.  History of CHF Euvolemic. Last 2-D echo with low normal EF.      Code Status : Full code  Family Communication  : None at bedside   Disposition  Plan  : Home model if no further bleeding and H&H stable.  Barriers For Discharge : Monitoring for stable H&H  Consults  :   [facility]  Procedures  : None  DVT Prophylaxis  :  SCDs  Lab Results  Component Value Date   PLT 199 05/03/2015    Antibiotics  :  None  Anti-infectives    None        Objective:   Filed Vitals:   05/03/15 1645 05/03/15 2008 05/04/15 0439 05/04/15 1200  BP: 158/76 111/44 127/52 115/52  Pulse: 81 83 76 82  Temp: 98.2 F (36.8 C) 98.7 F (37.1 C) 98.6 F (37 C) 98 F (36.7 C)  TempSrc: Oral Oral Oral Oral  Resp: 15 16 16 18   Height:      Weight: 75.3 kg (166 lb 0.1 oz)  76.1 kg (167 lb 12.3 oz)   SpO2: 99% 99% 100% 96%    Wt Readings from Last 3 Encounters:  05/04/15 76.1 kg (167 lb 12.3 oz)  04/10/15 86.637 kg (191 lb)  03/11/15 86.637 kg (191 lb)     Intake/Output Summary (Last 24 hours) at 05/04/15 1325 Last data filed at 05/04/15 0900  Gross per 24 hour  Intake    616 ml  Output   2124 ml  Net  -1508 ml     Physical Exam  Gen: not in distress HEENT: no pallor, moist mucosa, supple neck Chest: clear b/l, no added sounds CVS: N S1&S2, no murmurs GI: soft, NT, ND, BS+ Musculoskeletal: Bilateral BKA, left sided AV fistula     Data Review:    CBC  Recent Labs Lab 05/01/15 1613  05/02/15 0336 05/02/15 1137 05/03/15 0306 05/03/15 1818 05/04/15 0521  WBC 9.2  --   --   --  9.0  --   --   HGB 7.9*  < > 8.8* 8.4* 7.5* 9.3* 8.6*   HCT 24.6*  < > 27.9* 26.2* 23.8* 28.7* 26.2*  PLT 163  --   --   --  199  --   --   MCV 95.7  --   --   --  92.2  --   --   MCH 30.7  --   --   --  29.1  --   --   MCHC 32.1  --   --   --  31.5  --   --   RDW 14.0  --   --   --  16.8*  --   --   LYMPHSABS 2.0  --   --   --   --   --   --   MONOABS 0.4  --   --   --   --   --   --   EOSABS 0.1  --   --   --   --   --   --   BASOSABS 0.0  --   --   --   --   --   --   < > = values in this interval not displayed.  Chemistries   Recent Labs Lab 05/01/15 1613 05/02/15 0336 05/03/15 0306  NA 138 138 134*  K 4.9 3.3* 4.5  CL 96* 97* 95*  CO2 26 26 22   GLUCOSE 380* 105* 156*  BUN 35* 43* 58*  CREATININE 5.40* 5.75* 6.45*  CALCIUM 9.1 9.2 9.0  AST 15  --   --   ALT 10*  --   --  ALKPHOS 81  --   --   BILITOT 0.5  --   --    ------------------------------------------------------------------------------------------------------------------ No results for input(s): CHOL, HDL, LDLCALC, TRIG, CHOLHDL, LDLDIRECT in the last 72 hours.  Lab Results  Component Value Date   HGBA1C 12.3* 04/10/2015   ------------------------------------------------------------------------------------------------------------------ No results for input(s): TSH, T4TOTAL, T3FREE, THYROIDAB in the last 72 hours.  Invalid input(s): FREET3 ------------------------------------------------------------------------------------------------------------------ No results for input(s): VITAMINB12, FOLATE, FERRITIN, TIBC, IRON, RETICCTPCT in the last 72 hours.  Coagulation profile  Recent Labs Lab 05/01/15 1613  INR 1.06    No results for input(s): DDIMER in the last 72 hours.  Cardiac Enzymes No results for input(s): CKMB, TROPONINI, MYOGLOBIN in the last 168 hours.  Invalid input(s): CK ------------------------------------------------------------------------------------------------------------------ No results found for: BNP  Inpatient  Medications  Scheduled Meds: . sodium chloride   Intravenous Once  . amLODipine  10 mg Oral Daily  . calcium acetate  667 mg Oral TID WC  . [START ON 05/07/2015] darbepoetin (ARANESP) injection - DIALYSIS  60 mcg Intravenous Q Wed-HD  . doxercalciferol  4 mcg Intravenous Q M,W,F-HD  . insulin aspart  0-15 Units Subcutaneous TID WC  . insulin detemir  17 Units Subcutaneous BID  . pantoprazole (PROTONIX) IV  40 mg Intravenous Q12H  . pravastatin  40 mg Oral Daily  . pregabalin  75 mg Oral Daily  . sodium chloride flush  3 mL Intravenous Q12H  . tamsulosin  0.4 mg Oral Daily   Continuous Infusions:   PRN Meds:.acetaminophen **OR** acetaminophen, HYDROcodone-acetaminophen, ondansetron **OR** ondansetron (ZOFRAN) IV  Micro Results Recent Results (from the past 240 hour(s))  MRSA PCR Screening     Status: None   Collection Time: 05/01/15  8:09 PM  Result Value Ref Range Status   MRSA by PCR NEGATIVE NEGATIVE Final    Comment:        The GeneXpert MRSA Assay (FDA approved for NASAL specimens only), is one component of a comprehensive MRSA colonization surveillance program. It is not intended to diagnose MRSA infection nor to guide or monitor treatment for MRSA infections.     Radiology Reports No results found.  Time Spent in minutes  25   Louellen Molder M.D on 05/04/2015 at 1:25 PM  Between 7am to 7pm - Pager - [phone]  After 7pm go to [email or web address] - password [facility]  [facility] -  Office  [phone]

## 2015-05-04 NOTE — Progress Notes (Signed)
  [facility] Progress Note   Subjective: stable Hb 8.6  Filed Vitals:   05/03/15 2008 05/04/15 0439 05/04/15 1200 05/04/15 1942  BP: 111/44 127/52 115/52 116/47  Pulse: 83 76 82 69  Temp: 98.7 F (37.1 C) 98.6 F (37 C) 98 F (36.7 C) 98.3 F (36.8 C)  TempSrc: Oral Oral Oral Oral  Resp: 16 16 18 18   Height:      Weight:  76.1 kg (167 lb 12.3 oz)    SpO2: 99% 100% 96% 98%    Inpatient medications: . sodium chloride   Intravenous Once  . amLODipine  10 mg Oral Daily  . calcium acetate  667 mg Oral TID WC  . [START ON 05/07/2015] darbepoetin (ARANESP) injection - DIALYSIS  60 mcg Intravenous Q Wed-HD  . doxercalciferol  4 mcg Intravenous Q M,W,F-HD  . insulin aspart  0-15 Units Subcutaneous TID WC  . insulin detemir  17 Units Subcutaneous BID  . pantoprazole (PROTONIX) IV  40 mg Intravenous Q12H  . pravastatin  40 mg Oral Daily  . pregabalin  75 mg Oral Daily  . sodium chloride flush  3 mL Intravenous Q12H  . tamsulosin  0.4 mg Oral Daily     acetaminophen **OR** acetaminophen, HYDROcodone-acetaminophen, ondansetron **OR** ondansetron (ZOFRAN) IV  Exam: Alert, elderly AAM no distress, calm No jvd Chest clear bilat RRR no mrg Abd soft ntnd obese bilat BKA no edema Neuro is alert, nf L arm AVF +bruit  Dialysis: MWF    4h 80min  R IJ/ L arm AVF (using AVF, cath for removal 4/22 ckv) 2/2.25 bath Hep 8500 Hect 4 Mircera 50 q 2 pth 521      Assessment: 1 GIB acute, lower- bleeding Dieulafoy's lesion in rectum rx with endoclips. Hb now stable. Asa restarted, hold plavix one week.  2 Anemia sp prbc's 3 ESRD no hep w HD for now 4 MBD 5 CAD hx nstemi/ CEA/ bilat BKA 6 DM per prim 7 dispo prob dc tomorrow if stable    Plan - HD Monday am.    Kelly Splinter MD [facility] pager [phone]    cell [phone] 05/04/2015, 7:51 PM    Recent Labs Lab 05/01/15 1613 05/02/15 0336 05/03/15 0306  NA 138 138 134*  K 4.9 3.3* 4.5  CL  96* 97* 95*  CO2 26 26 22   GLUCOSE 380* 105* 156*  BUN 35* 43* 58*  CREATININE 5.40* 5.75* 6.45*  CALCIUM 9.1 9.2 9.0    Recent Labs Lab 05/01/15 1613  AST 15  ALT 10*  ALKPHOS 81  BILITOT 0.5  PROT 6.3*  ALBUMIN 3.1*    Recent Labs Lab 05/01/15 1613  05/03/15 0306 05/03/15 1818 05/04/15 0521  WBC 9.2  --  9.0  --   --   NEUTROABS 6.6  --   --   --   --   HGB 7.9*  < > 7.5* 9.3* 8.6*  HCT 24.6*  < > 23.8* 28.7* 26.2*  MCV 95.7  --  92.2  --   --   PLT 163  --  199  --   --   < > = values in this interval not displayed.

## 2015-05-04 NOTE — Progress Notes (Signed)
     [facility] Progress Note   Assessment / Plan: -Dieulafoy's lesion was seen in rectum and treated with 2 endoclips. -Rectal bleeding: Due to above. -Acute on chronic anemia: Acutely secondary to blood loss.  Hgb down to 8.6 grams this AM from 9.3 grams yesterday, but no sign of bleeding so probably equilibration.  Monitor and transfuse prn. -CKD on HD MWF -Uncontrolled DM 2 -CAD/PVD with B/L BKA: Was on Plavix, which is now being held (can restart 4/29). -Constipation:  Uses just miralax prn currently as outpatient but should try to keep stools soft, so will start Miralax daily.  *If no further bleeding and Hgb stable 4/24 then likely ok for D/C from GI standpoint.   LOS: 3 days   ZEHR, JESSICA D.  05/04/2015, 9:05 AM  Pager number [phone]    [place] Attending   I have taken an interval history, reviewed the chart and examined the patient. I agree with the Advanced Practitioner's note, impression and recommendations.     No more bleeding 81 mg ASA ok now Clopidogrel 4/29 barring more problems Does not need outpatient GI appt after dc  Thanks signing off  Gatha Mayer, MD, [facility] [phone] (pager) [phone] after 5 PM, weekends and holidays  05/04/2015 11:08 AM    Subjective:  No BM or bleeding since colonoscopy 4/22.  No pain.  Feels better.  Eating well.  Objective:  Vital signs in last 24 hours: Temp:  [98.2 F (36.8 C)-98.7 F (37.1 C)] 98.6 F (37 C) (04/23 0439) Pulse Rate:  [73-84] 76 (04/23 0439) Resp:  [11-22] 16 (04/23 0439) BP: (89-168)/(28-76) 127/52 mmHg (04/23 0439) SpO2:  [95 %-100 %] 100 % (04/23 0439) Weight:  [166 lb 0.1 oz (75.3 kg)-170 lb 3.1 oz (77.2 kg)] 167 lb 12.3 oz (76.1 kg) (04/23 0439) Last BM Date: 04/30/15 General:  Alert, Well-developed, in NAD Heart:  Regular rate and rhythm; no murmurs Pulm:  CTAB.  No W/R/R. Abdomen:  Soft, non-distended.  BS present.   Non-tender. Extremities:  B/L BKA's. Neurologic:  Alert and oriented x 4;  grossly normal neurologically. Psych:  Alert and cooperative. Normal mood and affect.   Lab Results:  Recent Labs  05/01/15 1613  05/03/15 0306 05/03/15 1818 05/04/15 0521  WBC 9.2  --  9.0  --   --   HGB 7.9*  < > 7.5* 9.3* 8.6*  HCT 24.6*  < > 23.8* 28.7* 26.2*  PLT 163  --  199  --   --   < > = values in this interval not displayed.

## 2015-05-05 ENCOUNTER — Telehealth: Payer: Self-pay | Admitting: *Deleted

## 2015-05-05 DIAGNOSIS — N189 Chronic kidney disease, unspecified: Secondary | ICD-10-CM

## 2015-05-05 DIAGNOSIS — Z794 Long term (current) use of insulin: Secondary | ICD-10-CM

## 2015-05-05 DIAGNOSIS — D631 Anemia in chronic kidney disease: Secondary | ICD-10-CM

## 2015-05-05 DIAGNOSIS — E0865 Diabetes mellitus due to underlying condition with hyperglycemia: Secondary | ICD-10-CM

## 2015-05-05 LAB — RENAL FUNCTION PANEL
Albumin: 2.9 g/dL — ABNORMAL LOW (ref 3.5–5.0)
Anion gap: 11 (ref 5–15)
BUN: 37 mg/dL — AB (ref 6–20)
CALCIUM: 9.2 mg/dL (ref 8.9–10.3)
CHLORIDE: 100 mmol/L — AB (ref 101–111)
CO2: 26 mmol/L (ref 22–32)
CREATININE: 6.68 mg/dL — AB (ref 0.61–1.24)
GFR calc non Af Amer: 7 mL/min — ABNORMAL LOW (ref >60)
GFR, EST AFRICAN AMERICAN: 8 mL/min — AB (ref >60)
Glucose, Bld: 108 mg/dL — ABNORMAL HIGH (ref 65–99)
Phosphorus: 6.4 mg/dL — ABNORMAL HIGH (ref 2.5–4.6)
Potassium: 4 mmol/L (ref 3.5–5.1)
SODIUM: 137 mmol/L (ref 135–145)

## 2015-05-05 LAB — GLUCOSE, CAPILLARY
GLUCOSE-CAPILLARY: 119 mg/dL — AB (ref 65–99)
Glucose-Capillary: 137 mg/dL — ABNORMAL HIGH (ref 65–99)

## 2015-05-05 LAB — HEMOGLOBIN AND HEMATOCRIT, BLOOD
HCT: 24.7 % — ABNORMAL LOW (ref 39.0–52.0)
HEMOGLOBIN: 7.9 g/dL — AB (ref 13.0–17.0)

## 2015-05-05 MED ORDER — LIDOCAINE-PRILOCAINE 2.5-2.5 % EX CREA
1.0000 "application " | TOPICAL_CREAM | CUTANEOUS | Status: DC | PRN
  Filled 2015-05-05: qty 5

## 2015-05-05 MED ORDER — DOXERCALCIFEROL 4 MCG/2ML IV SOLN
INTRAVENOUS | Status: AC
  Filled 2015-05-05: qty 2

## 2015-05-05 MED ORDER — CLOPIDOGREL BISULFATE 75 MG PO TABS: 75.0000 mg | ORAL_TABLET | Freq: Every day | ORAL | Status: DC

## 2015-05-05 MED ORDER — HEPARIN SODIUM (PORCINE) 1000 UNIT/ML DIALYSIS
1000.0000 [IU] | INTRAMUSCULAR | Status: DC | PRN
  Filled 2015-05-05: qty 1

## 2015-05-05 MED ORDER — LIDOCAINE HCL (PF) 1 % IJ SOLN: 5.0000 mL | INTRAMUSCULAR | Status: DC | PRN

## 2015-05-05 MED ORDER — SODIUM CHLORIDE 0.9 % IV SOLN: 100.0000 mL | INTRAVENOUS | Status: DC | PRN

## 2015-05-05 MED ORDER — PENTAFLUOROPROP-TETRAFLUOROETH EX AERO: 1.0000 "application " | INHALATION_SPRAY | CUTANEOUS | Status: DC | PRN

## 2015-05-05 MED ORDER — POLYETHYLENE GLYCOL 3350 17 G PO PACK: 17.0000 g | PACK | Freq: Every day | ORAL | Status: DC

## 2015-05-05 MED ORDER — ALTEPLASE 2 MG IJ SOLR
2.0000 mg | Freq: Once | INTRAMUSCULAR | Status: DC | PRN
Start: 2015-05-05 — End: 2015-05-05

## 2015-05-05 NOTE — Discharge Instructions (Signed)
Gastrointestinal Bleeding °Gastrointestinal bleeding is bleeding somewhere along the path that food travels through the body (digestive tract). This path is anywhere between the mouth and the opening of the butt (anus). You may have blood in your throw up (vomit) or in your poop (stools). If there is a lot of bleeding, you may need to stay in the hospital. °HOME CARE °· Only take medicine as told by your doctor. °· Eat foods with fiber such as whole grains, fruits, and vegetables. You can also try eating 1 to 3 prunes a day. °· Drink enough fluids to keep your pee (urine) clear or pale yellow. °GET HELP RIGHT AWAY IF:  °· Your bleeding gets worse. °· You feel dizzy, weak, or you pass out (faint). °· You have bad cramps in your back or belly (abdomen). °· You have large blood clumps (clots) in your poop. °· Your problems are getting worse. °MAKE SURE YOU:  °· Understand these instructions. °· Will watch your condition. °· Will get help right away if you are not doing well or get worse. °  °This information is not intended to replace advice given to you by your health care provider. Make sure you discuss any questions you have with your health care provider. °  °Document Released: 10/07/2007 Document Revised: 12/15/2011 Document Reviewed: 06/17/2014 °Elsevier Interactive Patient Education ©2016 [employer] ° °

## 2015-05-05 NOTE — Progress Notes (Signed)
Hgb 7.9, MD notified, will continue to monitor, Thanks Arvella Nigh RN

## 2015-05-05 NOTE — Discharge Summary (Signed)
Physician Discharge Summary  Aaron Long [id number] DOB: 04/05/1940 DOA: 05/01/2015  PCP: Aaron Kinnier, DO  Admit date: 05/01/2015 Discharge date: 05/05/2015  Time spent:35 minutes  Recommendations for Outpatient Follow-up:  1. Resume aspirin upon discharge 2. Hold plavix upon discharge until  05/10/2015 3. Follow up with PCP in 1 week. Please check h&H during follow up. ( can be done with HD )   Discharge Diagnoses:  Principal Problem:   Rectal bleeding   Active Problems:   Acute blood loss anemia   Dieulafoy lesion of rectum   HTN (hypertension)   CAD (coronary artery disease)   PAD (peripheral artery disease) (HCC)   Anemia in chronic renal disease   Diabetes mellitus with peripheral vascular disease (HCC)   GERD (gastroesophageal reflux disease)   DM type 2, uncontrolled, with renal complications ([name])   Status post bilateral below knee amputation ([name])   ESRD on dialysis [name])   Discharge Condition: fair  Diet recommendation: diabetic/ renal  Filed Weights   05/04/15 0439 05/05/15 0443 05/05/15 0756  Weight: 76.1 kg (167 lb 12.3 oz) 75.932 kg (167 lb 6.4 oz) 77.9 kg (171 lb 11.8 oz)    History of present illness:  75 year old male with multiple comorbidities including ESRD on dialysis, CAD with history of CABG, uncontrolled type 2 diabetes mellitus (A1c >12) with diabetic neuropathy and peripheral vascular disease status post bilateral BKA's, wheelchair-bound, history of CHF, TIA presented to the ED with 2 day history of bright red blood per rectum occasionally mixed with stool. Patient found to have significant drop in his H&H (7.9 from baseline of 12-13 recently). Admitted to hospitalist service, GI and nephrology consulted. Patient underwent colonoscopy showing 2 bleeding dieulafoy lesion in the rectum that were clipped. Patient stable for discharge home. instrcuted to avoid NSAIDs and call PCP or return to ED if he has further GI bleed.  Hospital  Course:  Principal Problem:  Rectal bleeding with acute blood anemia Secondary to bleeding dieulafoy's lesion in the rectum that were treated with 2 endoclips. No further rectal bleed. Received 2 units PRBC so far. H&H stable. Aspirin and Plavix held. As per GI recommendations, resumed aspirin but holding Plavix for a week (start on 4/29). Will discharge him on daily miralax.     Active Problems: Uncontrolled type 2 diabetes mellitus A1c >12. Resumed home dose levemir . FSG in 100s. Needs tight blood glucose control.    CAD (coronary artery disease) Stable. Continue statin   PAD (peripheral artery disease) (HCC) Held  aspirin and Plavix. Resume aspirin upon discharge. Start Plavix from 4/29. Continue statin.    GERD (gastroesophageal reflux disease) stable    ESRD on dialysis [name]) Makes some urine. renal following. Received HD during hospitalization. Continue PhosLo. Can be discharged home after HD today.  History of CHF Euvolemic. Last 2-D echo with low normal EF.      Code Status : Full code  Family Communication : None at bedside   Disposition Plan : Home     Consults :  [name]  Procedures : colonoscopy on  4/22  Discharge Exam: Filed Vitals:   05/05/15 0801 05/05/15 0830  BP: 130/64 121/56  Pulse: 70 66  Temp:    Resp:      Gen: elderly male not in distress HEENT: no pallor, moist mucosa, supple neck Chest: clear b/l, no added sounds CVS: N S1&S2, no murmurs GI: soft, NT, ND, BS+ Musculoskeletal: Bilateral BKA, left sided AV fistula CNS: alert and oriented  Discharge Instructions    Current Discharge Medication List  Start taking this medication   polyethylene glycol packet    17 gm by mouth daily for 14 days    CONTINUE these medications which have CHANGED   Details  clopidogrel (PLAVIX) 75 MG tablet Take 1 tablet (75 mg total) by mouth daily. Qty: 90 tablet, Refills: 0   Associated Diagnoses: Coronary  artery disease involving native coronary artery of native heart without angina pectoris  Start taking on 05/10/2015    CONTINUE these medications which have NOT CHANGED   Details  amLODipine (NORVASC) 10 MG tablet Take 1 tablet (10 mg total) by mouth daily. Qty: 90 tablet, Refills: 0   Associated Diagnoses: Essential hypertension    aspirin EC 81 MG tablet Take 1 tablet (81 mg total) by mouth every morning. Qty: 90 tablet, Refills: 0    calcium acetate (PHOSLO) 667 MG capsule Take 1 capsule (667 mg total) by mouth 3 (three) times daily with meals. Qty: 90 capsule, Refills: 1    HYDROcodone-acetaminophen (NORCO/VICODIN) 5-325 MG tablet Take 1 tablet by mouth every 6 (six) hours as needed for moderate pain. Qty: 120 tablet, Refills: 0   Associated Diagnoses: Post-op pain    insulin aspart (NOVOLOG) 100 UNIT/ML FlexPen Inject 5 Units into the skin 3 (three) times daily with meals. Inject 5 units three times a day with meals if blood sugar greater than 150 only, DX: 250.71 Qty: 15 mL, Refills: 1    Insulin Detemir (LEVEMIR) 100 UNIT/ML Pen Inject 17 Units into the skin 2 (two) times daily. Qty: 15 mL, Refills: 1   Associated Diagnoses: Uncontrolled type 2 diabetes mellitus with chronic kidney disease on chronic dialysis, with long-term current use of insulin (HCC)    pravastatin (PRAVACHOL) 40 MG tablet Take 1 tablet (40 mg total) by mouth daily. Qty: 90 tablet, Refills: 0   Associated Diagnoses: Hyperlipidemia    pregabalin (LYRICA) 75 MG capsule Take one tablet by mouth once daily for pains Qty: 90 capsule, Refills: 0   Associated Diagnoses: Phantom pain (HCC)    tamsulosin (FLOMAX) 0.4 MG CAPS capsule Take 1 capsule (0.4 mg total) by mouth daily. Qty: 90 capsule, Refills: 0   Associated Diagnoses: BPH (benign prostatic hyperplasia)    !! AMBULATORY NON FORMULARY MEDICATION BD U/P Mini Pen [place] 31GX5MM Use three times daily as directed DX: E11.9 Qty: 100 each, Refills: 11     !! AMBULATORY NON FORMULARY MEDICATION One Touch Ultra 2 Test Strips Sig:  Check blood sugars Three times daily to keep blood sugars under control Dx: E11.9 Qty: 100 strip, Refills: 11        STOP taking these medications     QUEtiapine (SEROQUEL) 25 MG tablet      senna-docusate (SENOKOT-S) 8.6-50 MG tablet        Allergies  Allergen Reactions  . Oxycodone Other (See Comments)    Hallucinations   Follow-up Information    Follow up with REED, TIFFANY, DO. Schedule an appointment as soon as possible for a visit in 1 week.   Specialty:  Geriatric Medicine   Contact information:   [address] [place] [address] [phone]        The results of significant diagnostics from this hospitalization (including imaging, microbiology, ancillary and laboratory) are listed below for reference.    Significant Diagnostic Studies: No results found.  Microbiology: Recent Results (from the past 240 hour(s))  MRSA PCR Screening     Status: None  Collection Time: 05/01/15  8:09 PM  Result Value Ref Range Status   MRSA by PCR NEGATIVE NEGATIVE Final    Comment:        The GeneXpert MRSA Assay (FDA approved for NASAL specimens only), is one component of a comprehensive MRSA colonization surveillance program. It is not intended to diagnose MRSA infection nor to guide or monitor treatment for MRSA infections.      Labs: Basic Metabolic Panel:  Recent Labs Lab 05/01/15 1613 05/02/15 0336 05/03/15 0306  NA 138 138 134*  K 4.9 3.3* 4.5  CL 96* 97* 95*  CO2 26 26 22   GLUCOSE 380* 105* 156*  BUN 35* 43* 58*  CREATININE 5.40* 5.75* 6.45*  CALCIUM 9.1 9.2 9.0   Liver Function Tests:  Recent Labs Lab 05/01/15 1613  AST 15  ALT 10*  ALKPHOS 81  BILITOT 0.5  PROT 6.3*  ALBUMIN 3.1*   No results for input(s): LIPASE, AMYLASE in the last 168 hours. No results for input(s): AMMONIA in the last 168 hours. CBC:  Recent Labs Lab 05/01/15 1613  05/02/15 1137  05/03/15 0306 05/03/15 1818 05/04/15 0521 05/05/15 0324  WBC 9.2  --   --  9.0  --   --   --   NEUTROABS 6.6  --   --   --   --   --   --   HGB 7.9*  < > 8.4* 7.5* 9.3* 8.6* 7.9*  HCT 24.6*  < > 26.2* 23.8* 28.7* 26.2* 24.7*  MCV 95.7  --   --  92.2  --   --   --   PLT 163  --   --  199  --   --   --   < > = values in this interval not displayed. Cardiac Enzymes: No results for input(s): CKTOTAL, CKMB, CKMBINDEX, TROPONINI in the last 168 hours. BNP: BNP (last 3 results) No results for input(s): BNP in the last 8760 hours.  ProBNP (last 3 results) No results for input(s): PROBNP in the last 8760 hours.  CBG:  Recent Labs Lab 05/04/15 0800 05/04/15 1202 05/04/15 1547 05/04/15 2104 05/05/15 0605  GLUCAP 160* 245* 278* 134* 119*       Signed:  Louellen Molder MD.  [facility] 05/05/2015, 8:51 AM

## 2015-05-05 NOTE — Progress Notes (Signed)
[facility] Progress Note   Subjective:   Feels fine Seen on HD Has not seen any further bleeding   Filed Vitals:   05/05/15 0443 05/05/15 0756 05/05/15 0801 05/05/15 0830  BP: 150/50 130/59 130/64 121/56  Pulse: 71 70 70 66  Temp: 98.3 F (36.8 C) 98.3 F (36.8 C)    TempSrc: Oral Oral    Resp: 20 20    Height:      Weight: 75.932 kg (167 lb 6.4 oz) 77.9 kg (171 lb 11.8 oz)    SpO2: 99% 99%     Exam: Alert, elderly AAM no distress Seen on dialysis - comfortable No jvd Chest clear bilat Regular S1S2 No S3 Abd soft and non-tender Bilat BKA's with  no edema Neuro is alert L arm AVF cannulated Still has tunnelled HD catheter in place (was to have been removed on 4/22)   Inpatient medications: . sodium chloride   Intravenous Once  . amLODipine  10 mg Oral Daily  . calcium acetate  667 mg Oral TID WC  . [START ON 05/07/2015] darbepoetin (ARANESP) injection - DIALYSIS  60 mcg Intravenous Q Wed-HD  . doxercalciferol  4 mcg Intravenous Q M,W,F-HD  . insulin aspart  0-15 Units Subcutaneous TID WC  . insulin detemir  17 Units Subcutaneous BID  . pantoprazole (PROTONIX) IV  40 mg Intravenous Q12H  . pravastatin  40 mg Oral Daily  . pregabalin  75 mg Oral Daily  . sodium chloride flush  3 mL Intravenous Q12H  . tamsulosin  0.4 mg Oral Daily     sodium chloride, sodium chloride, acetaminophen **OR** acetaminophen, alteplase, heparin, HYDROcodone-acetaminophen, lidocaine (PF), lidocaine-prilocaine, ondansetron **OR** ondansetron (ZOFRAN) IV, pentafluoroprop-tetrafluoroeth     Recent Labs Lab 05/02/15 0336 05/03/15 0306 05/05/15 0828  NA 138 134* 137  K 3.3* 4.5 4.0  CL 97* 95* 100*  CO2 26 22 26   GLUCOSE 105* 156* 108*  BUN 43* 58* 37*  CREATININE 5.75* 6.45* 6.68*  CALCIUM 9.2 9.0 9.2  PHOS  --   --  6.4*    Recent Labs Lab 05/01/15 1613 05/05/15 0828  AST 15  --   ALT 10*  --   ALKPHOS 81  --   BILITOT 0.5  --   PROT 6.3*  --    ALBUMIN 3.1* 2.9*    Recent Labs Lab 05/01/15 1613  05/03/15 0306 05/03/15 1818 05/04/15 0521 05/05/15 0324  WBC 9.2  --  9.0  --   --   --   NEUTROABS 6.6  --   --   --   --   --   HGB 7.9*  < > 7.5* 9.3* 8.6* 7.9*  HCT 24.6*  < > 23.8* 28.7* 26.2* 24.7*  MCV 95.7  --  92.2  --   --   --   PLT 163  --  199  --   --   --   < > = values in this interval not displayed.   Dialysis:  MWF     4h 53min   R IJ/ L arm AVF (using AVF, cath for removal 4/22 ckv)  2/2.25 bath Hep 8500 Hect 4 Mircera 50 q 2 pth 521     Assessment: 1. GIB acute, lower- bleeding Dieulafoy's lesion in rectum rx with endoclips Carlean Purl, 4/22). Has not seen any further bleeding. Plavix to be held until 4/29.  2. Anemia s/p prbc's (2 units). Was on Mircera 50 mcg every 2 weeks - increase to 150  mcg at discharge. Check Hb qTMT for 2 weeks. 3. ESRD no hep w HD for at least 2 weeks if Hb remains stable. Still has TDC in place - reschedule removal with [facility] (using AVF now - was to have been removed 4/22 but was hospitalized) 4. MBD - binders and hectorol 5. ASCVD - CAD hx nstemi/ CEA 6. Bilateral  BKA's 7. DM per primary 8. Home today after dialysis  Jamal Maes, MD [place] Pager 05/05/2015, 9:02 AM

## 2015-05-05 NOTE — Procedures (Signed)
I have personally attended this patient's dialysis session.   L AVF cannulated, BFR 400 Keeping volume even No heparin (d/t recent GI bleed)  Jamal Maes, MD [place] Pager 05/05/2015, 9:20 AM

## 2015-05-05 NOTE — Telephone Encounter (Signed)
Wife, [name] called requesting refill on pain medication. Patient is currently in hospital. Informed wife that when patient is discharged we can resume writing Rx's. She agreed and will call back once patient is discharged.

## 2015-05-05 NOTE — Care Management Note (Addendum)
Case Management Note  Patient Details  Name: Aaron Long MRN: [id number] Date of Birth: Jan 29, 1940  Subjective/Objective:     Pt admitted with rectal bleeding       Action/Plan:  PTA pt from home with wife, active with [facility] for PT, RN, Aide.  Pt has hx old BKA.  CM will request resumption orders prior to discharge   Expected Discharge Date:  05/02/15               Expected Discharge Plan:  [place]  In-House Referral:     Discharge planning Services  CM Consult  Post Acute Care Choice:  Resumption of Svcs/PTA Provider Choice offered to:  Patient  DME Arranged:    DME Agency:     HH Arranged:  RN, PT, Nurse's Aide [place] Agency:  [facility]  Status of Service:  In process, will continue to follow  Medicare Important Message Given:    Date Medicare IM Given:    Medicare IM give by:    Date Additional Medicare IM Given:    Additional Medicare Important Message give by:     If discussed at [place] of Stay Meetings, dates discussed:    Additional Comments: Pt stated he was from home with his wife and is wheelchair bound.  Pt offered choice of HH - pt wants to remain with [facility].  Resumption orders received - agency accepted referral.  Pt is BKA and request transport home (pt doesn't have wheelchair here with him and stated he can not get it here)  Pt informed of possible charge for services , pt stated he understood.  Transport arranged for 3pm - CM verified address with pt. Maryclare Labrador, RN 05/05/2015, 1:48 PM

## 2015-05-06 ENCOUNTER — Other Ambulatory Visit: Payer: Self-pay | Admitting: *Deleted

## 2015-05-06 ENCOUNTER — Encounter (HOSPITAL_COMMUNITY): Payer: Medicare Other

## 2015-05-06 ENCOUNTER — Ambulatory Visit: Payer: Medicare Other | Admitting: Vascular Surgery

## 2015-05-07 ENCOUNTER — Encounter (HOSPITAL_COMMUNITY): Payer: Self-pay | Admitting: Internal Medicine

## 2015-05-07 DIAGNOSIS — D631 Anemia in chronic kidney disease: Secondary | ICD-10-CM | POA: Diagnosis not present

## 2015-05-07 DIAGNOSIS — N2581 Secondary hyperparathyroidism of renal origin: Secondary | ICD-10-CM | POA: Diagnosis not present

## 2015-05-07 DIAGNOSIS — E119 Type 2 diabetes mellitus without complications: Secondary | ICD-10-CM | POA: Diagnosis not present

## 2015-05-07 DIAGNOSIS — N186 End stage renal disease: Secondary | ICD-10-CM | POA: Diagnosis not present

## 2015-05-08 DIAGNOSIS — Z452 Encounter for adjustment and management of vascular access device: Secondary | ICD-10-CM | POA: Diagnosis not present

## 2015-05-09 ENCOUNTER — Other Ambulatory Visit: Payer: Self-pay

## 2015-05-09 DIAGNOSIS — D631 Anemia in chronic kidney disease: Secondary | ICD-10-CM | POA: Diagnosis not present

## 2015-05-09 DIAGNOSIS — G8918 Other acute postprocedural pain: Secondary | ICD-10-CM

## 2015-05-09 DIAGNOSIS — E119 Type 2 diabetes mellitus without complications: Secondary | ICD-10-CM | POA: Diagnosis not present

## 2015-05-09 DIAGNOSIS — N2581 Secondary hyperparathyroidism of renal origin: Secondary | ICD-10-CM | POA: Diagnosis not present

## 2015-05-09 DIAGNOSIS — N186 End stage renal disease: Secondary | ICD-10-CM | POA: Diagnosis not present

## 2015-05-09 MED ORDER — HYDROCODONE-ACETAMINOPHEN 5-325 MG PO TABS: 1.0000 | ORAL_TABLET | Freq: Four times a day (QID) | ORAL | Status: DC | PRN

## 2015-05-11 DIAGNOSIS — N186 End stage renal disease: Secondary | ICD-10-CM | POA: Diagnosis not present

## 2015-05-11 DIAGNOSIS — E1122 Type 2 diabetes mellitus with diabetic chronic kidney disease: Secondary | ICD-10-CM | POA: Diagnosis not present

## 2015-05-11 DIAGNOSIS — Z992 Dependence on renal dialysis: Secondary | ICD-10-CM | POA: Diagnosis not present

## 2015-05-12 ENCOUNTER — Other Ambulatory Visit: Payer: Self-pay | Admitting: Nurse Practitioner

## 2015-05-14 DIAGNOSIS — N2581 Secondary hyperparathyroidism of renal origin: Secondary | ICD-10-CM | POA: Diagnosis not present

## 2015-05-14 DIAGNOSIS — N186 End stage renal disease: Secondary | ICD-10-CM | POA: Diagnosis not present

## 2015-05-14 DIAGNOSIS — E119 Type 2 diabetes mellitus without complications: Secondary | ICD-10-CM | POA: Diagnosis not present

## 2015-05-14 DIAGNOSIS — D631 Anemia in chronic kidney disease: Secondary | ICD-10-CM | POA: Diagnosis not present

## 2015-05-16 DIAGNOSIS — D631 Anemia in chronic kidney disease: Secondary | ICD-10-CM | POA: Diagnosis not present

## 2015-05-16 DIAGNOSIS — N186 End stage renal disease: Secondary | ICD-10-CM | POA: Diagnosis not present

## 2015-05-16 DIAGNOSIS — E119 Type 2 diabetes mellitus without complications: Secondary | ICD-10-CM | POA: Diagnosis not present

## 2015-05-16 DIAGNOSIS — N2581 Secondary hyperparathyroidism of renal origin: Secondary | ICD-10-CM | POA: Diagnosis not present

## 2015-05-19 ENCOUNTER — Ambulatory Visit: Payer: Medicare Other | Admitting: Internal Medicine

## 2015-05-21 DIAGNOSIS — E119 Type 2 diabetes mellitus without complications: Secondary | ICD-10-CM | POA: Diagnosis not present

## 2015-05-21 DIAGNOSIS — D631 Anemia in chronic kidney disease: Secondary | ICD-10-CM | POA: Diagnosis not present

## 2015-05-21 DIAGNOSIS — N2581 Secondary hyperparathyroidism of renal origin: Secondary | ICD-10-CM | POA: Diagnosis not present

## 2015-05-21 DIAGNOSIS — N186 End stage renal disease: Secondary | ICD-10-CM | POA: Diagnosis not present

## 2015-05-23 DIAGNOSIS — N186 End stage renal disease: Secondary | ICD-10-CM | POA: Diagnosis not present

## 2015-05-23 DIAGNOSIS — D631 Anemia in chronic kidney disease: Secondary | ICD-10-CM | POA: Diagnosis not present

## 2015-05-23 DIAGNOSIS — E119 Type 2 diabetes mellitus without complications: Secondary | ICD-10-CM | POA: Diagnosis not present

## 2015-05-23 DIAGNOSIS — N2581 Secondary hyperparathyroidism of renal origin: Secondary | ICD-10-CM | POA: Diagnosis not present

## 2015-05-28 DIAGNOSIS — N186 End stage renal disease: Secondary | ICD-10-CM | POA: Diagnosis not present

## 2015-05-28 DIAGNOSIS — E119 Type 2 diabetes mellitus without complications: Secondary | ICD-10-CM | POA: Diagnosis not present

## 2015-05-28 DIAGNOSIS — N2581 Secondary hyperparathyroidism of renal origin: Secondary | ICD-10-CM | POA: Diagnosis not present

## 2015-05-28 DIAGNOSIS — D631 Anemia in chronic kidney disease: Secondary | ICD-10-CM | POA: Diagnosis not present

## 2015-05-30 DIAGNOSIS — N2581 Secondary hyperparathyroidism of renal origin: Secondary | ICD-10-CM | POA: Diagnosis not present

## 2015-05-30 DIAGNOSIS — E119 Type 2 diabetes mellitus without complications: Secondary | ICD-10-CM | POA: Diagnosis not present

## 2015-05-30 DIAGNOSIS — N186 End stage renal disease: Secondary | ICD-10-CM | POA: Diagnosis not present

## 2015-05-30 DIAGNOSIS — D631 Anemia in chronic kidney disease: Secondary | ICD-10-CM | POA: Diagnosis not present

## 2015-06-02 DIAGNOSIS — D631 Anemia in chronic kidney disease: Secondary | ICD-10-CM | POA: Diagnosis not present

## 2015-06-02 DIAGNOSIS — N186 End stage renal disease: Secondary | ICD-10-CM | POA: Diagnosis not present

## 2015-06-02 DIAGNOSIS — N2581 Secondary hyperparathyroidism of renal origin: Secondary | ICD-10-CM | POA: Diagnosis not present

## 2015-06-02 DIAGNOSIS — E119 Type 2 diabetes mellitus without complications: Secondary | ICD-10-CM | POA: Diagnosis not present

## 2015-06-03 ENCOUNTER — Other Ambulatory Visit: Payer: Self-pay | Admitting: Nurse Practitioner

## 2015-06-04 DIAGNOSIS — N2581 Secondary hyperparathyroidism of renal origin: Secondary | ICD-10-CM | POA: Diagnosis not present

## 2015-06-04 DIAGNOSIS — E119 Type 2 diabetes mellitus without complications: Secondary | ICD-10-CM | POA: Diagnosis not present

## 2015-06-04 DIAGNOSIS — D631 Anemia in chronic kidney disease: Secondary | ICD-10-CM | POA: Diagnosis not present

## 2015-06-04 DIAGNOSIS — N186 End stage renal disease: Secondary | ICD-10-CM | POA: Diagnosis not present

## 2015-06-05 ENCOUNTER — Encounter: Payer: Self-pay | Admitting: Internal Medicine

## 2015-06-05 ENCOUNTER — Ambulatory Visit: Payer: Medicare Other | Admitting: Internal Medicine

## 2015-06-05 DIAGNOSIS — Z0289 Encounter for other administrative examinations: Secondary | ICD-10-CM

## 2015-06-06 ENCOUNTER — Other Ambulatory Visit: Payer: Self-pay | Admitting: *Deleted

## 2015-06-06 DIAGNOSIS — N2581 Secondary hyperparathyroidism of renal origin: Secondary | ICD-10-CM | POA: Diagnosis not present

## 2015-06-06 DIAGNOSIS — E119 Type 2 diabetes mellitus without complications: Secondary | ICD-10-CM | POA: Diagnosis not present

## 2015-06-06 DIAGNOSIS — D631 Anemia in chronic kidney disease: Secondary | ICD-10-CM | POA: Diagnosis not present

## 2015-06-06 DIAGNOSIS — G8918 Other acute postprocedural pain: Secondary | ICD-10-CM

## 2015-06-06 DIAGNOSIS — N186 End stage renal disease: Secondary | ICD-10-CM | POA: Diagnosis not present

## 2015-06-06 MED ORDER — HYDROCODONE-ACETAMINOPHEN 5-325 MG PO TABS: 1.0000 | ORAL_TABLET | Freq: Four times a day (QID) | ORAL | Status: DC | PRN

## 2015-06-06 NOTE — Telephone Encounter (Signed)
Spoke with patient's wife and advised rx ready for pick-up and it will be at the front desk.  

## 2015-06-06 NOTE — Telephone Encounter (Signed)
Patient requested and will pick up 

## 2015-06-09 DIAGNOSIS — N2581 Secondary hyperparathyroidism of renal origin: Secondary | ICD-10-CM | POA: Diagnosis not present

## 2015-06-09 DIAGNOSIS — E119 Type 2 diabetes mellitus without complications: Secondary | ICD-10-CM | POA: Diagnosis not present

## 2015-06-09 DIAGNOSIS — D631 Anemia in chronic kidney disease: Secondary | ICD-10-CM | POA: Diagnosis not present

## 2015-06-09 DIAGNOSIS — N186 End stage renal disease: Secondary | ICD-10-CM | POA: Diagnosis not present

## 2015-06-11 DIAGNOSIS — N2581 Secondary hyperparathyroidism of renal origin: Secondary | ICD-10-CM | POA: Diagnosis not present

## 2015-06-11 DIAGNOSIS — E1122 Type 2 diabetes mellitus with diabetic chronic kidney disease: Secondary | ICD-10-CM | POA: Diagnosis not present

## 2015-06-11 DIAGNOSIS — Z992 Dependence on renal dialysis: Secondary | ICD-10-CM | POA: Diagnosis not present

## 2015-06-11 DIAGNOSIS — D631 Anemia in chronic kidney disease: Secondary | ICD-10-CM | POA: Diagnosis not present

## 2015-06-11 DIAGNOSIS — E119 Type 2 diabetes mellitus without complications: Secondary | ICD-10-CM | POA: Diagnosis not present

## 2015-06-11 DIAGNOSIS — N186 End stage renal disease: Secondary | ICD-10-CM | POA: Diagnosis not present

## 2015-06-12 DIAGNOSIS — N186 End stage renal disease: Secondary | ICD-10-CM | POA: Diagnosis not present

## 2015-06-12 DIAGNOSIS — I255 Ischemic cardiomyopathy: Secondary | ICD-10-CM | POA: Diagnosis not present

## 2015-06-12 DIAGNOSIS — I1311 Hypertensive heart and chronic kidney disease without heart failure, with stage 5 chronic kidney disease, or end stage renal disease: Secondary | ICD-10-CM | POA: Diagnosis not present

## 2015-06-12 DIAGNOSIS — E1122 Type 2 diabetes mellitus with diabetic chronic kidney disease: Secondary | ICD-10-CM | POA: Diagnosis not present

## 2015-06-12 DIAGNOSIS — E1165 Type 2 diabetes mellitus with hyperglycemia: Secondary | ICD-10-CM | POA: Diagnosis not present

## 2015-06-12 DIAGNOSIS — M6281 Muscle weakness (generalized): Secondary | ICD-10-CM | POA: Diagnosis not present

## 2015-06-13 DIAGNOSIS — N2581 Secondary hyperparathyroidism of renal origin: Secondary | ICD-10-CM | POA: Diagnosis not present

## 2015-06-13 DIAGNOSIS — E119 Type 2 diabetes mellitus without complications: Secondary | ICD-10-CM | POA: Diagnosis not present

## 2015-06-13 DIAGNOSIS — N186 End stage renal disease: Secondary | ICD-10-CM | POA: Diagnosis not present

## 2015-06-14 DIAGNOSIS — Z7982 Long term (current) use of aspirin: Secondary | ICD-10-CM | POA: Diagnosis not present

## 2015-06-14 DIAGNOSIS — I252 Old myocardial infarction: Secondary | ICD-10-CM | POA: Diagnosis not present

## 2015-06-14 DIAGNOSIS — M6281 Muscle weakness (generalized): Secondary | ICD-10-CM | POA: Diagnosis not present

## 2015-06-14 DIAGNOSIS — Z794 Long term (current) use of insulin: Secondary | ICD-10-CM | POA: Diagnosis not present

## 2015-06-14 DIAGNOSIS — I2510 Atherosclerotic heart disease of native coronary artery without angina pectoris: Secondary | ICD-10-CM | POA: Diagnosis not present

## 2015-06-14 DIAGNOSIS — I1311 Hypertensive heart and chronic kidney disease without heart failure, with stage 5 chronic kidney disease, or end stage renal disease: Secondary | ICD-10-CM | POA: Diagnosis not present

## 2015-06-14 DIAGNOSIS — Z7902 Long term (current) use of antithrombotics/antiplatelets: Secondary | ICD-10-CM | POA: Diagnosis not present

## 2015-06-14 DIAGNOSIS — F17210 Nicotine dependence, cigarettes, uncomplicated: Secondary | ICD-10-CM | POA: Diagnosis not present

## 2015-06-14 DIAGNOSIS — Z89511 Acquired absence of right leg below knee: Secondary | ICD-10-CM | POA: Diagnosis not present

## 2015-06-14 DIAGNOSIS — E1165 Type 2 diabetes mellitus with hyperglycemia: Secondary | ICD-10-CM | POA: Diagnosis not present

## 2015-06-14 DIAGNOSIS — Z992 Dependence on renal dialysis: Secondary | ICD-10-CM | POA: Diagnosis not present

## 2015-06-14 DIAGNOSIS — E1122 Type 2 diabetes mellitus with diabetic chronic kidney disease: Secondary | ICD-10-CM | POA: Diagnosis not present

## 2015-06-14 DIAGNOSIS — Z955 Presence of coronary angioplasty implant and graft: Secondary | ICD-10-CM | POA: Diagnosis not present

## 2015-06-14 DIAGNOSIS — Z89512 Acquired absence of left leg below knee: Secondary | ICD-10-CM | POA: Diagnosis not present

## 2015-06-14 DIAGNOSIS — I739 Peripheral vascular disease, unspecified: Secondary | ICD-10-CM | POA: Diagnosis not present

## 2015-06-14 DIAGNOSIS — I255 Ischemic cardiomyopathy: Secondary | ICD-10-CM | POA: Diagnosis not present

## 2015-06-14 DIAGNOSIS — N186 End stage renal disease: Secondary | ICD-10-CM | POA: Diagnosis not present

## 2015-06-16 DIAGNOSIS — N2581 Secondary hyperparathyroidism of renal origin: Secondary | ICD-10-CM | POA: Diagnosis not present

## 2015-06-16 DIAGNOSIS — E119 Type 2 diabetes mellitus without complications: Secondary | ICD-10-CM | POA: Diagnosis not present

## 2015-06-16 DIAGNOSIS — N186 End stage renal disease: Secondary | ICD-10-CM | POA: Diagnosis not present

## 2015-06-18 DIAGNOSIS — N2581 Secondary hyperparathyroidism of renal origin: Secondary | ICD-10-CM | POA: Diagnosis not present

## 2015-06-18 DIAGNOSIS — E119 Type 2 diabetes mellitus without complications: Secondary | ICD-10-CM | POA: Diagnosis not present

## 2015-06-18 DIAGNOSIS — N186 End stage renal disease: Secondary | ICD-10-CM | POA: Diagnosis not present

## 2015-06-23 DIAGNOSIS — N186 End stage renal disease: Secondary | ICD-10-CM | POA: Diagnosis not present

## 2015-06-23 DIAGNOSIS — N2581 Secondary hyperparathyroidism of renal origin: Secondary | ICD-10-CM | POA: Diagnosis not present

## 2015-06-23 DIAGNOSIS — E119 Type 2 diabetes mellitus without complications: Secondary | ICD-10-CM | POA: Diagnosis not present

## 2015-06-25 DIAGNOSIS — E1165 Type 2 diabetes mellitus with hyperglycemia: Secondary | ICD-10-CM | POA: Diagnosis not present

## 2015-06-25 DIAGNOSIS — M6281 Muscle weakness (generalized): Secondary | ICD-10-CM | POA: Diagnosis not present

## 2015-06-25 DIAGNOSIS — N186 End stage renal disease: Secondary | ICD-10-CM | POA: Diagnosis not present

## 2015-06-25 DIAGNOSIS — I255 Ischemic cardiomyopathy: Secondary | ICD-10-CM | POA: Diagnosis not present

## 2015-06-25 DIAGNOSIS — E1122 Type 2 diabetes mellitus with diabetic chronic kidney disease: Secondary | ICD-10-CM | POA: Diagnosis not present

## 2015-06-25 DIAGNOSIS — I1311 Hypertensive heart and chronic kidney disease without heart failure, with stage 5 chronic kidney disease, or end stage renal disease: Secondary | ICD-10-CM | POA: Diagnosis not present

## 2015-06-26 ENCOUNTER — Encounter: Payer: Self-pay | Admitting: Internal Medicine

## 2015-06-26 ENCOUNTER — Telehealth: Payer: Self-pay | Admitting: *Deleted

## 2015-06-26 ENCOUNTER — Ambulatory Visit (INDEPENDENT_AMBULATORY_CARE_PROVIDER_SITE_OTHER): Payer: Medicare Other | Admitting: Internal Medicine

## 2015-06-26 VITALS — BP 160/80 | HR 86 | Temp 98.2°F

## 2015-06-26 DIAGNOSIS — Z872 Personal history of diseases of the skin and subcutaneous tissue: Secondary | ICD-10-CM | POA: Insufficient documentation

## 2015-06-26 DIAGNOSIS — Z89512 Acquired absence of left leg below knee: Secondary | ICD-10-CM

## 2015-06-26 DIAGNOSIS — M5010 Cervical disc disorder with radiculopathy, unspecified cervical region: Secondary | ICD-10-CM | POA: Diagnosis not present

## 2015-06-26 DIAGNOSIS — G609 Hereditary and idiopathic neuropathy, unspecified: Secondary | ICD-10-CM | POA: Diagnosis not present

## 2015-06-26 DIAGNOSIS — I2510 Atherosclerotic heart disease of native coronary artery without angina pectoris: Secondary | ICD-10-CM | POA: Diagnosis not present

## 2015-06-26 DIAGNOSIS — E1151 Type 2 diabetes mellitus with diabetic peripheral angiopathy without gangrene: Secondary | ICD-10-CM

## 2015-06-26 DIAGNOSIS — Z89511 Acquired absence of right leg below knee: Secondary | ICD-10-CM

## 2015-06-26 DIAGNOSIS — I739 Peripheral vascular disease, unspecified: Secondary | ICD-10-CM | POA: Diagnosis not present

## 2015-06-26 NOTE — Telephone Encounter (Signed)
Noted.  We'll see if he comes in today.

## 2015-06-26 NOTE — Progress Notes (Signed)
Location:  [facility] clinic Provider:  Tanveer Brammer L. Mariea Clonts, D.O., C.M.D.  Code Status: full code Goals of Care:  Advanced Directives 06/26/2015  Does patient have an advance directive? Yes  Type of Advance Directive Living will  Copy of advanced directive(s) in chart? Yes  Would patient like information on creating an advanced directive? -   Chief Complaint  Patient presents with  . Medical Management of Chronic Issues    face to face for powerwheelchair    HPI: Patient is a 75 y.o. male seen today for mobility assessment for power wheelchair.    Right hand cannot grip. Has poor circulation to his fingers.  Had a steal syndrome to his right hand.  It's discolored on the top.  Can only use his thumb to move his wheelchair on the right.  He previously had his HD access there.  Cannot use left hand to grip due to HD access. Has amputations of both lower extremities.     Discussed importance of attending dialysis faithfully.  Needs power chair with tilt and recline capability-reasons: Has peripheral neuropathy.  He has had a sacral pressure ulcer in the past and is unable to move himself and reposition due to his right arm weakness and amputations, poor postural stability.  I have reviewed and agree with the PT evaluation.     He needs a specialized cushion as well due to the history of sacral pressure ulcer in 12/16.    Past Medical History  Diagnosis Date  . PAD (peripheral artery disease) ([place])     a. R CEA 2007. b. Hx L BKA in 2013. c. s/p LE angioplasty in 2014. d. Hx R BKA in 2014.  . Gangrene of toe (HCC)     dry  . Neuromuscular disorder ([place])     diabetic neuropathy  . Hypertension   . Hyperlipidemia   . Peripheral neuropathy ([place])   . Constipation     takes Miralax daily  . Hemorrhoids   . Hx of colonic polyps   . Coronary artery disease     a. s/p NSTEMI/CABGx4 in 2007 - LIMA-LAD, SVG-optional diagonal, SVVG-OM, SVG-dRCA. b. Nuc 05/2011: nonischemic.  [name] BPH (benign  prostatic hypertrophy)   . Abnormality of gait   . DVT (deep venous thrombosis) ([place])     a. Lower extremity DVT in 2013.  [name] Hypertrophy of prostate without urinary obstruction and other lower urinary tract symptoms (LUTS)   . Disorder of bone and cartilage, unspecified   . Lower limb amputation, below knee   . Anemia   . Secondary hyperparathyroidism ([place])     Secondary Hyperpara- Thyroidism, Renal  . Ischemic cardiomyopathy     a. EF 40% in 2007. b. 51% by nuc 05/2011.  [name] Complication of anesthesia     "he gets delirious"  . Myocardial infarction ([place]) 2005  . Type II diabetes mellitus ([place])   . Diabetic peripheral neuropathy ([place])   . Prostate cancer ([place]) 2010  . ESRD on dialysis [facility])     M-W-F  . Kidney stones   . Dieulafoy lesion of rectum 05/03/2015    Past Surgical History  Procedure Laterality Date  . Prostatectomy  2009  . Knee cartilage surgery Left 1964  . Coronary artery bypass graft  2005    CABG X4  . Carotid endarterectomy Right 2005  . Cataract extraction w/ intraocular lens  implant, bilateral Bilateral 2011  . Colonoscopy    . Cardiac catheterization  05/28/11  . Amputation  06/14/2011    Procedure: AMPUTATION DIGIT;  Surgeon: Elam Dutch, MD;  Location: [facility] OR;  Service: Vascular;  Laterality: Left;  Amputation Left fifth toe  . Amputation  06/16/2011    Procedure: AMPUTATION BELOW KNEE;  Surgeon: Elam Dutch, MD;  Location: [place];  Service: Vascular;  Laterality: Left;  . I&d extremity  01/31/2012    Procedure: IRRIGATION AND DEBRIDEMENT EXTREMITY;  Surgeon: Elam Dutch, MD;  Location: [place];  Service: Vascular;  Laterality: Left;  I & D Left BKA   . Amputation Right 06/12/2012    Procedure: AMPUTATION DIGIT;  Surgeon: Elam Dutch, MD;  Location: [facility] OR;  Service: Vascular;  Laterality: Right;  GREAT TOE  . Amputation Right 08/08/2012    Procedure: AMPUTATION BELOW KNEE;  Surgeon: Elam Dutch, MD;  Location: [facility] OR;  Service: Vascular;   Laterality: Right;  . Abdominal aortagram N/A 05/28/2011    Procedure: ABDOMINAL Maxcine Ham;  Surgeon: Elam Dutch, MD;  Location: [facility] CATH LAB;  Service: Cardiovascular;  Laterality: N/A;  . Abdominal aortagram N/A 06/02/2012    Procedure: ABDOMINAL Maxcine Ham;  Surgeon: Elam Dutch, MD;  Location: [facility] CATH LAB;  Service: Cardiovascular;  Laterality: N/A;  . Abdominal aortagram N/A 06/09/2012    Procedure: ABDOMINAL Maxcine Ham;  Surgeon: Elam Dutch, MD;  Location: [facility] CATH LAB;  Service: Cardiovascular;  Laterality: N/A;  . Insertion of dialysis catheter Right 04/19/2014    Procedure: INSERTION OF DIALYSIS CATHETER-INTERNAL JUGULAR;  Surgeon: Mal Misty, MD;  Location: [place];  Service: Vascular;  Laterality: Right;  . Av fistula placement Right 04/19/2014    Procedure: RIGHT ARM ARTERIOVENOUS (AV) FISTULA CREATION;  Surgeon: Mal Misty, MD;  Location: [place];  Service: Vascular;  Laterality: Right;  . Ligation of competing branches of arteriovenous fistula Right 06/19/2014    Procedure: Right Arm LIGATION OF COMPETING BRANCHES OF RADIOCEPHALIC ARTERIOVENOUS FISTULA;  Surgeon: Mal Misty, MD;  Location: [place];  Service: Vascular;  Laterality: Right;  . Cardiac catheterization N/A 10/04/2014    Procedure: Left Heart Cath and Coronary Angiography;  Surgeon: Sherren Mocha, MD;  Location: [place] CV LAB;  Service: Cardiovascular;  Laterality: N/A;  . Endarterectomy Left 10/17/2014    Procedure: ENDARTERECTOMY CAROTID;  Surgeon: Mal Misty, MD;  Location: [place];  Service: Vascular;  Laterality: Left;  . Ligation of arteriovenous  fistula Right 11/06/2014    Procedure: LIGATION OF RADIOCEPHALIC ARTERIOVENOUS FISTULA;  Surgeon: Mal Misty, MD;  Location: [place];  Service: Vascular;  Laterality: Right;  . Insertion of dialysis catheter N/A 11/06/2014    Procedure: INSERTION OF 23cm DIALYSIS CATHETER - right internal jugular;  Surgeon: Mal Misty, MD;  Location: [place];  Service:  Vascular;  Laterality: N/A;  . Av fistula placement Left 12/12/2014    Procedure: BRACHIOCEPHALIC ARTERIOVENOUS (AV) FISTULA CREATION;  Surgeon: Mal Misty, MD;  Location: [place];  Service: Vascular;  Laterality: Left;  . Colonoscopy N/A 05/03/2015    Procedure: COLONOSCOPY;  Surgeon: Gatha Mayer, MD;  Location: [place];  Service: Endoscopy;  Laterality: N/A;    Allergies  Allergen Reactions  . Oxycodone Other (See Comments)    Hallucinations      Medication List       This list is accurate as of: 06/26/15  2:57 PM.  Always use your most recent med list.               amLODipine 10  MG tablet  Commonly known as:  NORVASC  TAKE (1) TABLET BY MOUTH ONCE DAILY     aspirin EC 81 MG tablet  Take 1 tablet (81 mg total) by mouth every morning.     calcium acetate 667 MG capsule  Commonly known as:  PHOSLO  Take 1 capsule (667 mg total) by mouth 3 (three) times daily with meals.     clopidogrel 75 MG tablet  Commonly known as:  PLAVIX  TAKE (1) TABLET BY MOUTH ONCE DAILY     EASY TOUCH PEN NEEDLES 31G X 5 MM Misc  Generic drug:  Insulin Pen Needle  3 (three) times daily. as directed     HYDROcodone-acetaminophen 5-325 MG tablet  Commonly known as:  NORCO/VICODIN  Take 1 tablet by mouth every 6 (six) hours as needed for moderate pain.     insulin aspart 100 UNIT/ML FlexPen  Commonly known as:  NOVOLOG  Inject 5 Units into the skin 3 (three) times daily with meals. Inject 5 units three times a day with meals if blood sugar greater than 150 only, DX: 250.71     Insulin Detemir 100 UNIT/ML Pen  Commonly known as:  LEVEMIR  Inject 17 Units into the skin 2 (two) times daily.     ONE TOUCH ULTRA TEST test strip  Generic drug:  glucose blood  USE TO TEST BLOOD SUGAR THREE TIMES A DAY TO KEEP BLOOD SUGARS UNDER CONTROL     polyethylene glycol packet  Commonly known as:  MIRALAX  Take 17 g by mouth daily.     pravastatin 40 MG tablet  Commonly known as:  PRAVACHOL    TAKE (1) TABLET BY MOUTH ONCE DAILY     pregabalin 75 MG capsule  Commonly known as:  LYRICA  Take one tablet by mouth once daily for pains     QUEtiapine 25 MG tablet  Commonly known as:  SEROQUEL  TAKE (1) TABLET BY MOUTH DAILY     tamsulosin 0.4 MG Caps capsule  Commonly known as:  FLOMAX  TAKE (1) CAPSULE BY MOUTH ONCE DAILY        Review of Systems:  Review of Systems  Constitutional: Positive for malaise/fatigue. Negative for fever and chills.  HENT: Negative for congestion.   Eyes:       Glasses  Respiratory: Negative for shortness of breath.   Cardiovascular: Negative for chest pain and palpitations.  Gastrointestinal: Negative for abdominal pain.  Genitourinary: Negative for dysuria.  Musculoskeletal: Positive for myalgias, falls and neck pain.       Bilateral BKAs; right hand unable to bend fingers to make a grip, fingers are exquisitely tender to touch, a bit cool and hand and forearm are dark in color; left UE 4+/5 with AV fistula in upper arm with normal pulse and thrill  Skin: Negative for rash.  Neurological: Positive for tingling, sensory change, focal weakness and weakness. Negative for dizziness, tremors and loss of consciousness.  Psychiatric/Behavioral: Positive for memory loss. Negative for depression.    Health Maintenance  Topic Date Due  . URINE MICROALBUMIN  04/11/2014  . OPHTHALMOLOGY EXAM  07/06/2014  . ZOSTAVAX  10/27/2015 (Originally 12/31/1999)  . INFLUENZA VACCINE  08/12/2015  . HEMOGLOBIN A1C  10/11/2015  . TETANUS/TDAP  01/11/2021  . COLONOSCOPY  05/02/2025  . PNA vac Low Risk Adult  Completed    Physical Exam: Filed Vitals:   06/26/15 1441  BP: 160/80  Pulse: 86  Temp: 98.2 F (36.8 C)  TempSrc: Oral  SpO2: 98%   There is no weight on file to calculate BMI. Physical Exam  Constitutional: He is oriented to person, place, and time. No distress.  Cardiovascular: Regular rhythm.   Pulmonary/Chest: Effort normal and breath  sounds normal. He has no rales.  Abdominal: Soft. Bowel sounds are normal. He exhibits no distension. There is no tenderness.  Musculoskeletal:  Bilateral BKAs; right hand unable to bend fingers to make a grip, fingers are exquisitely tender to touch, a bit cool and hand and forearm are dark in color; left UE 4+/5 with AV fistula in upper arm with normal pulse and thrill   Neurological: He is alert and oriented to person, place, and time. No cranial nerve deficit. He exhibits abnormal muscle tone. Coordination abnormal.  Some short term memory loss during visit--repeats himself a few times about forgetting his insulin when he returns home from HD  Skin: Skin is warm and dry.  Right hand darker in color and cool  Psychiatric: He has a normal mood and affect.    Labs reviewed: Basic Metabolic Panel:  Recent Labs  10/02/14 0730 10/04/14 1345  05/02/15 0336 05/03/15 0306 05/05/15 0828  NA 138 134*  < > 138 134* 137  K 4.2 4.0  < > 3.3* 4.5 4.0  CL 101 98*  < > 97* 95* 100*  CO2 26 26  < > 26 22 26   GLUCOSE 108* 123*  < > 105* 156* 108*  BUN 57* 38*  < > 43* 58* 37*  CREATININE 7.41* 6.16*  < > 5.75* 6.45* 6.68*  CALCIUM 9.5 9.0  < > 9.2 9.0 9.2  PHOS 6.5* 5.8*  --   --   --  6.4*  < > = values in this interval not displayed. Liver Function Tests:  Recent Labs  10/15/14 1345 04/10/15 1535 05/01/15 1613 05/05/15 0828  AST 16 12 15   --   ALT 11* 7 10*  --   ALKPHOS 114 123* 81  --   BILITOT 0.4 0.3 0.5  --   PROT 7.4 7.0 6.3*  --   ALBUMIN 3.5 4.2 3.1* 2.9*   No results for input(s): LIPASE, AMYLASE in the last 8760 hours. No results for input(s): AMMONIA in the last 8760 hours. CBC:  Recent Labs  09/30/14 2054  04/10/15 1535 05/01/15 1613  05/03/15 0306 05/03/15 1818 05/04/15 0521 05/05/15 0324  WBC 5.6  < > 6.4 9.2  --  9.0  --   --   --   NEUTROABS 2.5  --  3.9 6.6  --   --   --   --   --   HGB 11.1*  < >  --  7.9*  < > 7.5* 9.3* 8.6* 7.9*  HCT 35.1*  < >  38.0 24.6*  < > 23.8* 28.7* 26.2* 24.7*  MCV 98.6  < > 97 95.7  --  92.2  --   --   --   PLT 207  < > 246 163  --  199  --   --   --   < > = values in this interval not displayed. Lipid Panel:  Recent Labs  10/01/14 0148 04/10/15 1535  CHOL 178 237*  HDL 29* 34*  LDLCALC 117* 157*  TRIG 158* 228*  CHOLHDL 6.1 7.0*   Lab Results  Component Value Date   HGBA1C 12.3* 04/10/2015    Assessment/Plan 1. Diabetes mellitus with peripheral vascular disease ([place]) -poorly controlled, his daughter plans  to remind him to take his insulin when he eats upon return from HD (she will call him)  2. Hereditary and idiopathic peripheral neuropathy -seems this has worsened and also has poor circulation which does not help -unable to feel with his right hand or use anything but his thumb due to pain so unable to propel his manual wheelchair  3. PAD (peripheral artery disease) (H[facility]) -poor circulation to right hand since prior HD access was in right forearm and had a steal syndrome -also #4  4. Status post bilateral below knee amputation ([place]) -due to PAD from DMII -unable to propel his manual wheelchair  5. Cervical disc disorder with radiculopathy of cervical region -has been previously noted and contributes to weakness and pain in upper extremities  6. History of pressure ulcer -of sacrum late last year due to inability to reposition himself so needs a power chair that can tilt  Labs/tests ordered:  No orders of the defined types were placed in this encounter.   Next appt:  1 month f/u on diabetes--needs his hba1c then  Hector Venne L. Spenser Harren, D.O. [facility] [address], [place] [address] Cell Phone (Mon-Fri 8am-5pm):  [phone] On Call:  [phone] & follow prompts after 5pm & weekends Office Phone:  [phone] Office Fax:  [phone]

## 2015-06-26 NOTE — Telephone Encounter (Signed)
Santiago Glad with [facility] called and stated that patient has refused 2 nursing visits and did not go to Dialysis Yesterday. She stated that he pretended that he went and when his wife came home she found out different. Santiago Glad with [facility] just wanted to make you aware.

## 2015-06-27 ENCOUNTER — Other Ambulatory Visit: Payer: Self-pay | Admitting: Internal Medicine

## 2015-06-27 DIAGNOSIS — E119 Type 2 diabetes mellitus without complications: Secondary | ICD-10-CM | POA: Diagnosis not present

## 2015-06-27 DIAGNOSIS — N2581 Secondary hyperparathyroidism of renal origin: Secondary | ICD-10-CM | POA: Diagnosis not present

## 2015-06-27 DIAGNOSIS — N186 End stage renal disease: Secondary | ICD-10-CM | POA: Diagnosis not present

## 2015-06-30 DIAGNOSIS — N2581 Secondary hyperparathyroidism of renal origin: Secondary | ICD-10-CM | POA: Diagnosis not present

## 2015-06-30 DIAGNOSIS — E119 Type 2 diabetes mellitus without complications: Secondary | ICD-10-CM | POA: Diagnosis not present

## 2015-06-30 DIAGNOSIS — N186 End stage renal disease: Secondary | ICD-10-CM | POA: Diagnosis not present

## 2015-07-01 ENCOUNTER — Telehealth: Payer: Self-pay | Admitting: *Deleted

## 2015-07-01 DIAGNOSIS — E1165 Type 2 diabetes mellitus with hyperglycemia: Secondary | ICD-10-CM | POA: Diagnosis not present

## 2015-07-01 DIAGNOSIS — I255 Ischemic cardiomyopathy: Secondary | ICD-10-CM | POA: Diagnosis not present

## 2015-07-01 DIAGNOSIS — M6281 Muscle weakness (generalized): Secondary | ICD-10-CM | POA: Diagnosis not present

## 2015-07-01 DIAGNOSIS — N186 End stage renal disease: Secondary | ICD-10-CM | POA: Diagnosis not present

## 2015-07-01 DIAGNOSIS — E1122 Type 2 diabetes mellitus with diabetic chronic kidney disease: Secondary | ICD-10-CM | POA: Diagnosis not present

## 2015-07-01 DIAGNOSIS — I1311 Hypertensive heart and chronic kidney disease without heart failure, with stage 5 chronic kidney disease, or end stage renal disease: Secondary | ICD-10-CM | POA: Diagnosis not present

## 2015-07-01 NOTE — Telephone Encounter (Signed)
Betsy with [facility] called and requested verbal orders to continue PT for strengthening, Balance and gait. Verbal orders given.

## 2015-07-02 ENCOUNTER — Telehealth: Payer: Self-pay | Admitting: *Deleted

## 2015-07-02 DIAGNOSIS — E119 Type 2 diabetes mellitus without complications: Secondary | ICD-10-CM | POA: Diagnosis not present

## 2015-07-02 DIAGNOSIS — N186 End stage renal disease: Secondary | ICD-10-CM | POA: Diagnosis not present

## 2015-07-02 DIAGNOSIS — N2581 Secondary hyperparathyroidism of renal origin: Secondary | ICD-10-CM | POA: Diagnosis not present

## 2015-07-02 NOTE — Telephone Encounter (Signed)
Aaron Long with [facility] called and stated that he needs the Face to Face to finish the paperwork for Power Wheelchair. Printed and faxed to him Fax: [phone]

## 2015-07-03 ENCOUNTER — Telehealth: Payer: Self-pay | Admitting: *Deleted

## 2015-07-03 DIAGNOSIS — N186 End stage renal disease: Secondary | ICD-10-CM | POA: Diagnosis not present

## 2015-07-03 DIAGNOSIS — E1122 Type 2 diabetes mellitus with diabetic chronic kidney disease: Secondary | ICD-10-CM | POA: Diagnosis not present

## 2015-07-03 DIAGNOSIS — I255 Ischemic cardiomyopathy: Secondary | ICD-10-CM | POA: Diagnosis not present

## 2015-07-03 DIAGNOSIS — M6281 Muscle weakness (generalized): Secondary | ICD-10-CM | POA: Diagnosis not present

## 2015-07-03 DIAGNOSIS — E1165 Type 2 diabetes mellitus with hyperglycemia: Secondary | ICD-10-CM | POA: Diagnosis not present

## 2015-07-03 DIAGNOSIS — I1311 Hypertensive heart and chronic kidney disease without heart failure, with stage 5 chronic kidney disease, or end stage renal disease: Secondary | ICD-10-CM | POA: Diagnosis not present

## 2015-07-03 NOTE — Telephone Encounter (Signed)
Marissa with [facility] called and requested verbal orders for OT and Nurse Aid. Verbal order given

## 2015-07-04 ENCOUNTER — Other Ambulatory Visit: Payer: Self-pay | Admitting: *Deleted

## 2015-07-04 DIAGNOSIS — N186 End stage renal disease: Secondary | ICD-10-CM | POA: Diagnosis not present

## 2015-07-04 DIAGNOSIS — N2581 Secondary hyperparathyroidism of renal origin: Secondary | ICD-10-CM | POA: Diagnosis not present

## 2015-07-04 DIAGNOSIS — G8918 Other acute postprocedural pain: Secondary | ICD-10-CM

## 2015-07-04 DIAGNOSIS — E119 Type 2 diabetes mellitus without complications: Secondary | ICD-10-CM | POA: Diagnosis not present

## 2015-07-04 MED ORDER — HYDROCODONE-ACETAMINOPHEN 5-325 MG PO TABS: 1.0000 | ORAL_TABLET | Freq: Four times a day (QID) | ORAL | Status: DC | PRN

## 2015-07-04 NOTE — Telephone Encounter (Signed)
Patient requested and will pick up 

## 2015-07-07 DIAGNOSIS — N186 End stage renal disease: Secondary | ICD-10-CM | POA: Diagnosis not present

## 2015-07-07 DIAGNOSIS — N2581 Secondary hyperparathyroidism of renal origin: Secondary | ICD-10-CM | POA: Diagnosis not present

## 2015-07-07 DIAGNOSIS — E119 Type 2 diabetes mellitus without complications: Secondary | ICD-10-CM | POA: Diagnosis not present

## 2015-07-08 DIAGNOSIS — M6281 Muscle weakness (generalized): Secondary | ICD-10-CM | POA: Diagnosis not present

## 2015-07-08 DIAGNOSIS — I255 Ischemic cardiomyopathy: Secondary | ICD-10-CM | POA: Diagnosis not present

## 2015-07-08 DIAGNOSIS — E1122 Type 2 diabetes mellitus with diabetic chronic kidney disease: Secondary | ICD-10-CM | POA: Diagnosis not present

## 2015-07-08 DIAGNOSIS — N186 End stage renal disease: Secondary | ICD-10-CM | POA: Diagnosis not present

## 2015-07-08 DIAGNOSIS — I1311 Hypertensive heart and chronic kidney disease without heart failure, with stage 5 chronic kidney disease, or end stage renal disease: Secondary | ICD-10-CM | POA: Diagnosis not present

## 2015-07-08 DIAGNOSIS — E1165 Type 2 diabetes mellitus with hyperglycemia: Secondary | ICD-10-CM | POA: Diagnosis not present

## 2015-07-09 DIAGNOSIS — N186 End stage renal disease: Secondary | ICD-10-CM | POA: Diagnosis not present

## 2015-07-09 DIAGNOSIS — N2581 Secondary hyperparathyroidism of renal origin: Secondary | ICD-10-CM | POA: Diagnosis not present

## 2015-07-09 DIAGNOSIS — E119 Type 2 diabetes mellitus without complications: Secondary | ICD-10-CM | POA: Diagnosis not present

## 2015-07-10 DIAGNOSIS — N186 End stage renal disease: Secondary | ICD-10-CM | POA: Diagnosis not present

## 2015-07-10 DIAGNOSIS — E1122 Type 2 diabetes mellitus with diabetic chronic kidney disease: Secondary | ICD-10-CM | POA: Diagnosis not present

## 2015-07-10 DIAGNOSIS — M6281 Muscle weakness (generalized): Secondary | ICD-10-CM | POA: Diagnosis not present

## 2015-07-10 DIAGNOSIS — I1311 Hypertensive heart and chronic kidney disease without heart failure, with stage 5 chronic kidney disease, or end stage renal disease: Secondary | ICD-10-CM | POA: Diagnosis not present

## 2015-07-10 DIAGNOSIS — I255 Ischemic cardiomyopathy: Secondary | ICD-10-CM | POA: Diagnosis not present

## 2015-07-10 DIAGNOSIS — E1165 Type 2 diabetes mellitus with hyperglycemia: Secondary | ICD-10-CM | POA: Diagnosis not present

## 2015-07-11 DIAGNOSIS — N2581 Secondary hyperparathyroidism of renal origin: Secondary | ICD-10-CM | POA: Diagnosis not present

## 2015-07-11 DIAGNOSIS — E119 Type 2 diabetes mellitus without complications: Secondary | ICD-10-CM | POA: Diagnosis not present

## 2015-07-11 DIAGNOSIS — Z992 Dependence on renal dialysis: Secondary | ICD-10-CM | POA: Diagnosis not present

## 2015-07-11 DIAGNOSIS — N186 End stage renal disease: Secondary | ICD-10-CM | POA: Diagnosis not present

## 2015-07-11 DIAGNOSIS — E1122 Type 2 diabetes mellitus with diabetic chronic kidney disease: Secondary | ICD-10-CM | POA: Diagnosis not present

## 2015-07-14 DIAGNOSIS — E119 Type 2 diabetes mellitus without complications: Secondary | ICD-10-CM | POA: Diagnosis not present

## 2015-07-14 DIAGNOSIS — N2581 Secondary hyperparathyroidism of renal origin: Secondary | ICD-10-CM | POA: Diagnosis not present

## 2015-07-14 DIAGNOSIS — Z23 Encounter for immunization: Secondary | ICD-10-CM | POA: Diagnosis not present

## 2015-07-14 DIAGNOSIS — N186 End stage renal disease: Secondary | ICD-10-CM | POA: Diagnosis not present

## 2015-07-16 DIAGNOSIS — E119 Type 2 diabetes mellitus without complications: Secondary | ICD-10-CM | POA: Diagnosis not present

## 2015-07-16 DIAGNOSIS — N2581 Secondary hyperparathyroidism of renal origin: Secondary | ICD-10-CM | POA: Diagnosis not present

## 2015-07-16 DIAGNOSIS — Z23 Encounter for immunization: Secondary | ICD-10-CM | POA: Diagnosis not present

## 2015-07-16 DIAGNOSIS — N186 End stage renal disease: Secondary | ICD-10-CM | POA: Diagnosis not present

## 2015-07-17 DIAGNOSIS — E1122 Type 2 diabetes mellitus with diabetic chronic kidney disease: Secondary | ICD-10-CM | POA: Diagnosis not present

## 2015-07-17 DIAGNOSIS — M6281 Muscle weakness (generalized): Secondary | ICD-10-CM | POA: Diagnosis not present

## 2015-07-17 DIAGNOSIS — I1311 Hypertensive heart and chronic kidney disease without heart failure, with stage 5 chronic kidney disease, or end stage renal disease: Secondary | ICD-10-CM | POA: Diagnosis not present

## 2015-07-17 DIAGNOSIS — E1165 Type 2 diabetes mellitus with hyperglycemia: Secondary | ICD-10-CM | POA: Diagnosis not present

## 2015-07-17 DIAGNOSIS — I255 Ischemic cardiomyopathy: Secondary | ICD-10-CM | POA: Diagnosis not present

## 2015-07-17 DIAGNOSIS — N186 End stage renal disease: Secondary | ICD-10-CM | POA: Diagnosis not present

## 2015-07-18 DIAGNOSIS — N186 End stage renal disease: Secondary | ICD-10-CM | POA: Diagnosis not present

## 2015-07-18 DIAGNOSIS — N2581 Secondary hyperparathyroidism of renal origin: Secondary | ICD-10-CM | POA: Diagnosis not present

## 2015-07-18 DIAGNOSIS — Z23 Encounter for immunization: Secondary | ICD-10-CM | POA: Diagnosis not present

## 2015-07-18 DIAGNOSIS — E119 Type 2 diabetes mellitus without complications: Secondary | ICD-10-CM | POA: Diagnosis not present

## 2015-07-21 DIAGNOSIS — Z23 Encounter for immunization: Secondary | ICD-10-CM | POA: Diagnosis not present

## 2015-07-21 DIAGNOSIS — N186 End stage renal disease: Secondary | ICD-10-CM | POA: Diagnosis not present

## 2015-07-21 DIAGNOSIS — N2581 Secondary hyperparathyroidism of renal origin: Secondary | ICD-10-CM | POA: Diagnosis not present

## 2015-07-21 DIAGNOSIS — E119 Type 2 diabetes mellitus without complications: Secondary | ICD-10-CM | POA: Diagnosis not present

## 2015-07-22 DIAGNOSIS — E1165 Type 2 diabetes mellitus with hyperglycemia: Secondary | ICD-10-CM | POA: Diagnosis not present

## 2015-07-22 DIAGNOSIS — N186 End stage renal disease: Secondary | ICD-10-CM | POA: Diagnosis not present

## 2015-07-22 DIAGNOSIS — I255 Ischemic cardiomyopathy: Secondary | ICD-10-CM | POA: Diagnosis not present

## 2015-07-22 DIAGNOSIS — I1311 Hypertensive heart and chronic kidney disease without heart failure, with stage 5 chronic kidney disease, or end stage renal disease: Secondary | ICD-10-CM | POA: Diagnosis not present

## 2015-07-22 DIAGNOSIS — M6281 Muscle weakness (generalized): Secondary | ICD-10-CM | POA: Diagnosis not present

## 2015-07-22 DIAGNOSIS — E1122 Type 2 diabetes mellitus with diabetic chronic kidney disease: Secondary | ICD-10-CM | POA: Diagnosis not present

## 2015-07-23 DIAGNOSIS — N186 End stage renal disease: Secondary | ICD-10-CM | POA: Diagnosis not present

## 2015-07-23 DIAGNOSIS — Z23 Encounter for immunization: Secondary | ICD-10-CM | POA: Diagnosis not present

## 2015-07-23 DIAGNOSIS — E119 Type 2 diabetes mellitus without complications: Secondary | ICD-10-CM | POA: Diagnosis not present

## 2015-07-23 DIAGNOSIS — N2581 Secondary hyperparathyroidism of renal origin: Secondary | ICD-10-CM | POA: Diagnosis not present

## 2015-07-24 ENCOUNTER — Other Ambulatory Visit: Payer: Self-pay

## 2015-07-24 DIAGNOSIS — E1122 Type 2 diabetes mellitus with diabetic chronic kidney disease: Secondary | ICD-10-CM | POA: Diagnosis not present

## 2015-07-24 DIAGNOSIS — N186 End stage renal disease: Secondary | ICD-10-CM | POA: Diagnosis not present

## 2015-07-24 DIAGNOSIS — M6281 Muscle weakness (generalized): Secondary | ICD-10-CM | POA: Diagnosis not present

## 2015-07-24 DIAGNOSIS — E1165 Type 2 diabetes mellitus with hyperglycemia: Secondary | ICD-10-CM | POA: Diagnosis not present

## 2015-07-24 DIAGNOSIS — I1311 Hypertensive heart and chronic kidney disease without heart failure, with stage 5 chronic kidney disease, or end stage renal disease: Secondary | ICD-10-CM | POA: Diagnosis not present

## 2015-07-24 DIAGNOSIS — I255 Ischemic cardiomyopathy: Secondary | ICD-10-CM | POA: Diagnosis not present

## 2015-07-24 MED ORDER — CALCIUM ACETATE (PHOS BINDER) 667 MG PO CAPS: 667.0000 mg | ORAL_CAPSULE | Freq: Three times a day (TID) | ORAL | Status: DC

## 2015-07-24 NOTE — Telephone Encounter (Signed)
Received phone call from [place], in [place] for referral request on [facility]. Done

## 2015-07-25 DIAGNOSIS — N2581 Secondary hyperparathyroidism of renal origin: Secondary | ICD-10-CM | POA: Diagnosis not present

## 2015-07-25 DIAGNOSIS — E119 Type 2 diabetes mellitus without complications: Secondary | ICD-10-CM | POA: Diagnosis not present

## 2015-07-25 DIAGNOSIS — Z23 Encounter for immunization: Secondary | ICD-10-CM | POA: Diagnosis not present

## 2015-07-25 DIAGNOSIS — N186 End stage renal disease: Secondary | ICD-10-CM | POA: Diagnosis not present

## 2015-07-28 DIAGNOSIS — Z23 Encounter for immunization: Secondary | ICD-10-CM | POA: Diagnosis not present

## 2015-07-28 DIAGNOSIS — N2581 Secondary hyperparathyroidism of renal origin: Secondary | ICD-10-CM | POA: Diagnosis not present

## 2015-07-28 DIAGNOSIS — N186 End stage renal disease: Secondary | ICD-10-CM | POA: Diagnosis not present

## 2015-07-28 DIAGNOSIS — E119 Type 2 diabetes mellitus without complications: Secondary | ICD-10-CM | POA: Diagnosis not present

## 2015-07-29 DIAGNOSIS — E1165 Type 2 diabetes mellitus with hyperglycemia: Secondary | ICD-10-CM | POA: Diagnosis not present

## 2015-07-29 DIAGNOSIS — I1311 Hypertensive heart and chronic kidney disease without heart failure, with stage 5 chronic kidney disease, or end stage renal disease: Secondary | ICD-10-CM | POA: Diagnosis not present

## 2015-07-29 DIAGNOSIS — E1122 Type 2 diabetes mellitus with diabetic chronic kidney disease: Secondary | ICD-10-CM | POA: Diagnosis not present

## 2015-07-29 DIAGNOSIS — N186 End stage renal disease: Secondary | ICD-10-CM | POA: Diagnosis not present

## 2015-07-29 DIAGNOSIS — I255 Ischemic cardiomyopathy: Secondary | ICD-10-CM | POA: Diagnosis not present

## 2015-07-29 DIAGNOSIS — M6281 Muscle weakness (generalized): Secondary | ICD-10-CM | POA: Diagnosis not present

## 2015-07-30 DIAGNOSIS — Z23 Encounter for immunization: Secondary | ICD-10-CM | POA: Diagnosis not present

## 2015-07-30 DIAGNOSIS — N186 End stage renal disease: Secondary | ICD-10-CM | POA: Diagnosis not present

## 2015-07-30 DIAGNOSIS — N2581 Secondary hyperparathyroidism of renal origin: Secondary | ICD-10-CM | POA: Diagnosis not present

## 2015-07-30 DIAGNOSIS — E119 Type 2 diabetes mellitus without complications: Secondary | ICD-10-CM | POA: Diagnosis not present

## 2015-07-31 ENCOUNTER — Ambulatory Visit (INDEPENDENT_AMBULATORY_CARE_PROVIDER_SITE_OTHER): Payer: Medicare Other | Admitting: Nurse Practitioner

## 2015-07-31 ENCOUNTER — Encounter: Payer: Self-pay | Admitting: Nurse Practitioner

## 2015-07-31 VITALS — BP 148/84 | HR 83 | Temp 98.0°F | Resp 17

## 2015-07-31 DIAGNOSIS — E1151 Type 2 diabetes mellitus with diabetic peripheral angiopathy without gangrene: Secondary | ICD-10-CM | POA: Diagnosis not present

## 2015-07-31 DIAGNOSIS — I2510 Atherosclerotic heart disease of native coronary artery without angina pectoris: Secondary | ICD-10-CM | POA: Diagnosis not present

## 2015-07-31 NOTE — Patient Instructions (Signed)
To take levemir 17 unit twice daily every day  Take blood sugar with each meal--- and To take novolog 5 units WITH MEALS AS NEEDED for blood sugar over 150  Basic Carbohydrate Counting for Diabetes Mellitus Carbohydrate counting is a method for keeping track of the amount of carbohydrates you eat. Eating carbohydrates naturally increases the level of sugar (glucose) in your blood, so it is important for you to know the amount that is okay for you to have in every meal. Carbohydrate counting helps keep the level of glucose in your blood within normal limits. The amount of carbohydrates allowed is different for every person. A dietitian can help you calculate the amount that is right for you. Once you know the amount of carbohydrates you can have, you can count the carbohydrates in the foods you want to eat. Carbohydrates are found in the following foods:  Grains, such as breads and cereals.  Dried beans and soy products.  Starchy vegetables, such as potatoes, peas, and corn.  Fruit and fruit juices.  Milk and yogurt.  Sweets and snack foods, such as cake, cookies, candy, chips, soft drinks, and fruit drinks. CARBOHYDRATE COUNTING There are two ways to count the carbohydrates in your food. You can use either of the methods or a combination of both. Reading the "Nutrition Facts" on [place] The "Nutrition Facts" is an area that is included on the labels of almost all packaged food and beverages in the [place]. It includes the serving size of that food or beverage and information about the nutrients in each serving of the food, including the grams (g) of carbohydrate per serving.  Decide the number of servings of this food or beverage that you will be able to eat or drink. Multiply that number of servings by the number of grams of carbohydrate that is listed on the label for that serving. The total will be the amount of carbohydrates you will be having when you eat or drink this food or  beverage. Learning Standard Serving Sizes of Food When you eat food that is not packaged or does not include "Nutrition Facts" on the label, you need to measure the servings in order to count the amount of carbohydrates.A serving of most carbohydrate-rich foods contains about 15 g of carbohydrates. The following list includes serving sizes of carbohydrate-rich foods that provide 15 g ofcarbohydrate per serving:   1 slice of bread (1 oz) or 1 six-inch tortilla.    of a hamburger bun or English muffin.  4-6 crackers.   cup unsweetened dry cereal.    cup hot cereal.   cup rice or pasta.    cup mashed potatoes or  of a large baked potato.  1 cup fresh fruit or one small piece of fruit.    cup canned or frozen fruit or fruit juice.  1 cup milk.   cup plain fat-free yogurt or yogurt sweetened with artificial sweeteners.   cup cooked dried beans or starchy vegetable, such as peas, corn, or potatoes.  Decide the number of standard-size servings that you will eat. Multiply that number of servings by 15 (the grams of carbohydrates in that serving). For example, if you eat 2 cups of strawberries, you will have eaten 2 servings and 30 g of carbohydrates (2 servings x 15 g = 30 g). For foods such as soups and casseroles, in which more than one food is mixed in, you will need to count the carbohydrates in each food that is included.  EXAMPLE OF CARBOHYDRATE COUNTING Sample Dinner  3 oz chicken breast.   cup of brown rice.   cup of corn.  1 cup milk.   1 cup strawberries with sugar-free whipped topping.  Carbohydrate Calculation Step 1: Identify the foods that contain carbohydrates:   Rice.   Corn.   Milk.   Strawberries. Step 2:Calculate the number of servings eaten of each:   2 servings of rice.   1 serving of corn.   1 serving of milk.   1 serving of strawberries. Step 3: Multiply each of those number of servings by 15 g:   2 servings of  rice x 15 g = 30 g.   1 serving of corn x 15 g = 15 g.   1 serving of milk x 15 g = 15 g.   1 serving of strawberries x 15 g = 15 g. Step 4: Add together all of the amounts to find the total grams of carbohydrates eaten: 30 g + 15 g + 15 g + 15 g = 75 g.   This information is not intended to replace advice given to you by your health care provider. Make sure you discuss any questions you have with your health care provider.   Document Released: 12/28/2004 Document Revised: 01/18/2014 Document Reviewed: 11/24/2012 Elsevier Interactive Patient Education [employer].

## 2015-07-31 NOTE — Progress Notes (Signed)
Patient ID: Aaron Long, male   DOB: 09-Sep-1940, 75 y.o.   MRN: [id number]    PCP: Hollace Kinnier, DO  Advanced Directive information Does patient have an advance directive?: Yes, Type of Advance Directive: Living will, Does patient want to make changes to advanced directive?: No - Patient declined  Allergies  Allergen Reactions  . Oxycodone Other (See Comments)    Hallucinations    Chief Complaint  Patient presents with  . Medical Management of Chronic Issues    One month follow up on diabetes  . OTHER    Daughter and son in room with patient.      HPI: Patient is a 75 y.o. male seen in the office today to follow up diabetes. Pt with hx of PAD, Diabetes, HTN, GERD, neuropathy, ESRD and more.  Pt did not bring blood sugars to appt.  Reports he feels like he is doing good.  No hypoglycemic episodes Taking levemir 17 units twice daily- misses dose occasionally, maybe twice this month has missed.  Giving himself novolog with meals. Using about every other day- admits to take taking blood sugars but once daily  Review of Systems:  Review of Systems  Constitutional: Negative for fever and chills.  HENT: Negative for congestion.   Eyes:       Glasses- no changes in vision   Respiratory: Negative for shortness of breath.   Cardiovascular: Negative for chest pain and palpitations.  Gastrointestinal: Negative for abdominal pain.  Genitourinary: Negative for dysuria.  Musculoskeletal: Positive for myalgias.       Bilateral BKAs; left UE 4+/5 with AV fistula in upper arm with normal pulse and thrill  Neurological: Positive for weakness. Negative for dizziness and tremors.    Past Medical History  Diagnosis Date  . PAD (peripheral artery disease) ([place])     a. R CEA 2007. b. Hx L BKA in 2013. c. s/p LE angioplasty in 2014. d. Hx R BKA in 2014.  . Gangrene of toe (H[facility])     dry  . Neuromuscular disorder ([place])     diabetic neuropathy  . Hypertension   . Hyperlipidemia   .  Peripheral neuropathy ([place])   . Constipation     takes Miralax daily  . Hemorrhoids   . Hx of colonic polyps   . Coronary artery disease     a. s/p NSTEMI/CABGx4 in 2007 - LIMA-LAD, SVG-optional diagonal, SVVG-OM, SVG-dRCA. b. Nuc 05/2011: nonischemic.  [name] BPH (benign prostatic hypertrophy)   . Abnormality of gait   . DVT (deep venous thrombosis) ([place])     a. Lower extremity DVT in 2013.  [name] Hypertrophy of prostate without urinary obstruction and other lower urinary tract symptoms (LUTS)   . Disorder of bone and cartilage, unspecified   . Lower limb amputation, below knee   . Anemia   . Secondary hyperparathyroidism ([place])     Secondary Hyperpara- Thyroidism, Renal  . Ischemic cardiomyopathy     a. EF 40% in 2007. b. 51% by nuc 05/2011.  [name] Complication of anesthesia     "he gets delirious"  . Myocardial infarction ([place]) 2005  . Type II diabetes mellitus ([place])   . Diabetic peripheral neuropathy ([place])   . Prostate cancer ([place]) 2010  . ESRD on dialysis [facility])     M-W-F  . Kidney stones   . Dieulafoy lesion of rectum 05/03/2015   Past Surgical History  Procedure Laterality Date  . Prostatectomy  2009  . Knee cartilage surgery Left 1964  .  Coronary artery bypass graft  2005    CABG X4  . Carotid endarterectomy Right 2005  . Cataract extraction w/ intraocular lens  implant, bilateral Bilateral 2011  . Colonoscopy    . Cardiac catheterization  05/28/11  . Amputation  06/14/2011    Procedure: AMPUTATION DIGIT;  Surgeon: Elam Dutch, MD;  Location[facility] OR;  Service: Vascular;  Laterality: Left;  Amputation Left fifth toe  . Amputation  06/16/2011    Procedure: AMPUTATION BELOW KNEE;  Surgeon: Elam Dutch, MD;  Location: [place];  Service: Vascular;  Laterality: Left;  . I&d extremity  01/31/2012    Procedure: IRRIGATION AND DEBRIDEMENT EXTREMITY;  Surgeon: Elam Dutch, MD;  Location: [place];  Service: Vascular;  Laterality: Left;  I & D Left BKA   . Amputation Right 06/12/2012     Procedure: AMPUTATION DIGIT;  Surgeon: Elam Dutch, MD;  Location: [facility] OR;  Service: Vascular;  Laterality: Right;  GREAT TOE  . Amputation Right 08/08/2012    Procedure: AMPUTATION BELOW KNEE;  Surgeon: Elam Dutch, MD;  Location: [facility] OR;  Service: Vascular;  Laterality: Right;  . Abdominal aortagram N/A 05/28/2011    Procedure: ABDOMINAL Maxcine Ham;  Surgeon: Elam Dutch, MD;  Location: [facility] CATH LAB;  Service: Cardiovascular;  Laterality: N/A;  . Abdominal aortagram N/A 06/02/2012    Procedure: ABDOMINAL Maxcine Ham;  Surgeon: Elam Dutch, MD;  Location: [facility] CATH LAB;  Service: Cardiovascular;  Laterality: N/A;  . Abdominal aortagram N/A 06/09/2012    Procedure: ABDOMINAL Maxcine Ham;  Surgeon: Elam Dutch, MD;  Location: [facility] CATH LAB;  Service: Cardiovascular;  Laterality: N/A;  . Insertion of dialysis catheter Right 04/19/2014    Procedure: INSERTION OF DIALYSIS CATHETER-INTERNAL JUGULAR;  Surgeon: Mal Misty, MD;  Location: [place];  Service: Vascular;  Laterality: Right;  . Av fistula placement Right 04/19/2014    Procedure: RIGHT ARM ARTERIOVENOUS (AV) FISTULA CREATION;  Surgeon: Mal Misty, MD;  Location: [place];  Service: Vascular;  Laterality: Right;  . Ligation of competing branches of arteriovenous fistula Right 06/19/2014    Procedure: Right Arm LIGATION OF COMPETING BRANCHES OF RADIOCEPHALIC ARTERIOVENOUS FISTULA;  Surgeon: Mal Misty, MD;  Location: [place];  Service: Vascular;  Laterality: Right;  . Cardiac catheterization N/A 10/04/2014    Procedure: Left Heart Cath and Coronary Angiography;  Surgeon: Sherren Mocha, MD;  Location: [place] CV LAB;  Service: Cardiovascular;  Laterality: N/A;  . Endarterectomy Left 10/17/2014    Procedure: ENDARTERECTOMY CAROTID;  Surgeon: Mal Misty, MD;  Location: [place];  Service: Vascular;  Laterality: Left;  . Ligation of arteriovenous  fistula Right 11/06/2014    Procedure: LIGATION OF RADIOCEPHALIC ARTERIOVENOUS FISTULA;   Surgeon: Mal Misty, MD;  Location: [place];  Service: Vascular;  Laterality: Right;  . Insertion of dialysis catheter N/A 11/06/2014    Procedure: INSERTION OF 23cm DIALYSIS CATHETER - right internal jugular;  Surgeon: Mal Misty, MD;  Location: [place];  Service: Vascular;  Laterality: N/A;  . Av fistula placement Left 12/12/2014    Procedure: BRACHIOCEPHALIC ARTERIOVENOUS (AV) FISTULA CREATION;  Surgeon: Mal Misty, MD;  Location: [place];  Service: Vascular;  Laterality: Left;  . Colonoscopy N/A 05/03/2015    Procedure: COLONOSCOPY;  Surgeon: Gatha Mayer, MD;  Location: [place];  Service: Endoscopy;  Laterality: N/A;   Social History:   reports that he quit smoking about 24 years ago. His smoking use included Cigarettes.  He has a 30 pack-year smoking history. He has never used smokeless tobacco. He reports that he drinks alcohol. He reports that he uses illicit drugs (Marijuana).  Family History  Problem Relation Age of Onset  . Coronary artery disease Neg Hx   . Anesthesia problems Neg Hx   . Hypotension Neg Hx   . Malignant hyperthermia Neg Hx   . Pseudochol deficiency Neg Hx   . Hyperlipidemia Mother   . Hypertension Mother   . Cancer Mother   . Hyperlipidemia Father   . Hypertension Father   . Kidney disease Father   . Heart disease Sister   . Alzheimer's disease Sister   . Cancer Brother   . Other Sister     Medications: Patient's Medications  New Prescriptions   No medications on file  Previous Medications   AMLODIPINE (NORVASC) 10 MG TABLET    TAKE (1) TABLET BY MOUTH ONCE DAILY   ASPIRIN LOW DOSE 81 MG EC TABLET    TAKE 1 TABLET BY MOUTH EVERY MORNING   CALCIUM ACETATE (PHOSLO) 667 MG CAPSULE    Take 1 capsule (667 mg total) by mouth 3 (three) times daily with meals.   CLOPIDOGREL (PLAVIX) 75 MG TABLET    TAKE (1) TABLET BY MOUTH ONCE DAILY   EASY TOUCH PEN NEEDLES 31G X 5 MM MISC    3 (three) times daily. as directed   ETHYL CHLORIDE SPRAY    See admin  instructions.   HYDROCODONE-ACETAMINOPHEN (NORCO/VICODIN) 5-325 MG TABLET    Take 1 tablet by mouth every 6 (six) hours as needed for moderate pain.   INSULIN ASPART (NOVOLOG) 100 UNIT/ML FLEXPEN    Inject 5 Units into the skin 3 (three) times daily with meals. Inject 5 units three times a day with meals if blood sugar greater than 150 only, DX: 250.71   LEVEMIR FLEXTOUCH 100 UNIT/ML PEN    INJECT 17 UNITS SUBCUTANEOUSLY TWICE DAILY   ONE TOUCH ULTRA TEST TEST STRIP    USE TO TEST BLOOD SUGAR THREE TIMES A DAY TO KEEP BLOOD SUGARS UNDER CONTROL   POLYETHYLENE GLYCOL (MIRALAX) PACKET    Take 17 g by mouth daily.   PRAVASTATIN (PRAVACHOL) 40 MG TABLET    TAKE (1) TABLET BY MOUTH ONCE DAILY   PREGABALIN (LYRICA) 75 MG CAPSULE    Take one tablet by mouth once daily for pains   QUETIAPINE (SEROQUEL) 25 MG TABLET    TAKE (1) TABLET BY MOUTH DAILY   TAMSULOSIN (FLOMAX) 0.4 MG CAPS CAPSULE    TAKE (1) CAPSULE BY MOUTH ONCE DAILY  Modified Medications   No medications on file  Discontinued Medications   No medications on file     Physical Exam:  Filed Vitals:   07/31/15 1418  BP: 148/84  Pulse: 83  Temp: 98 F (36.7 C)  TempSrc: Oral  Resp: 17  SpO2: 96%   There is no weight on file to calculate BMI.  Physical Exam  Constitutional: He is oriented to person, place, and time. No distress.  Cardiovascular: Regular rhythm.   Pulmonary/Chest: Effort normal and breath sounds normal. He has no rales.  Abdominal: Soft. Bowel sounds are normal. He exhibits no distension. There is no tenderness.  Musculoskeletal:  Bilateral BKAs  Neurological: He is alert and oriented to person, place, and time. No cranial nerve deficit. He exhibits abnormal muscle tone. Coordination abnormal.  STML  Skin: Skin is warm and dry.  Psychiatric: He has a normal mood and affect.  Labs reviewed: Basic Metabolic Panel:  Recent Labs  10/02/14 0730 10/04/14 1345  05/02/15 0336 05/03/15 0306 05/05/15 0828    NA 138 134*  < > 138 134* 137  K 4.2 4.0  < > 3.3* 4.5 4.0  CL 101 98*  < > 97* 95* 100*  CO2 26 26  < > 26 22 26   GLUCOSE 108* 123*  < > 105* 156* 108*  BUN 57* 38*  < > 43* 58* 37*  CREATININE 7.41* 6.16*  < > 5.75* 6.45* 6.68*  CALCIUM 9.5 9.0  < > 9.2 9.0 9.2  PHOS 6.5* 5.8*  --   --   --  6.4*  < > = values in this interval not displayed. Liver Function Tests:  Recent Labs  10/15/14 1345 04/10/15 1535 05/01/15 1613 05/05/15 0828  AST 16 12 15   --   ALT 11* 7 10*  --   ALKPHOS 114 123* 81  --   BILITOT 0.4 0.3 0.5  --   PROT 7.4 7.0 6.3*  --   ALBUMIN 3.5 4.2 3.1* 2.9*   No results for input(s): LIPASE, AMYLASE in the last 8760 hours. No results for input(s): AMMONIA in the last 8760 hours. CBC:  Recent Labs  09/30/14 2054  04/10/15 1535 05/01/15 1613  05/03/15 0306 05/03/15 1818 05/04/15 0521 05/05/15 0324  WBC 5.6  < > 6.4 9.2  --  9.0  --   --   --   NEUTROABS 2.5  --  3.9 6.6  --   --   --   --   --   HGB 11.1*  < >  --  7.9*  < > 7.5* 9.3* 8.6* 7.9*  HCT 35.1*  < > 38.0 24.6*  < > 23.8* 28.7* 26.2* 24.7*  MCV 98.6  < > 97 95.7  --  92.2  --   --   --   PLT 207  < > 246 163  --  199  --   --   --   < > = values in this interval not displayed. Lipid Panel:  Recent Labs  10/01/14 0148 04/10/15 1535  CHOL 178 237*  HDL 29* 34*  LDLCALC 117* 157*  TRIG 158* 228*  CHOLHDL 6.1 7.0*   TSH: No results for input(s): TSH in the last 8760 hours. A1C: Lab Results  Component Value Date   HGBA1C 12.3* 04/10/2015     Assessment/Plan 1. Diabetes mellitus with peripheral vascular disease ([place]) -here today for diabetes follow up yet did not bring blood sugar log, reports he is not taking blood sugar but once daily -will have pt to take blood sugars TID with meals and record.  -went over diet and medication compliance and how he is supposed to take insulin.  - Hemoglobin A1c today    Danielle Lento K. Harle Battiest  [facility] [phone] 8 am - 5 pm) [phone] (after hours)

## 2015-08-01 ENCOUNTER — Telehealth: Payer: Self-pay

## 2015-08-01 DIAGNOSIS — N2581 Secondary hyperparathyroidism of renal origin: Secondary | ICD-10-CM | POA: Diagnosis not present

## 2015-08-01 DIAGNOSIS — N186 End stage renal disease: Secondary | ICD-10-CM | POA: Diagnosis not present

## 2015-08-01 DIAGNOSIS — E119 Type 2 diabetes mellitus without complications: Secondary | ICD-10-CM | POA: Diagnosis not present

## 2015-08-01 DIAGNOSIS — Z23 Encounter for immunization: Secondary | ICD-10-CM | POA: Diagnosis not present

## 2015-08-01 LAB — HEMOGLOBIN A1C
HEMOGLOBIN A1C: 11.1 % — AB (ref <5.7)
MEAN PLASMA GLUCOSE: 272 mg/dL

## 2015-08-01 NOTE — Telephone Encounter (Signed)
FYI- [facility] missed [facility] visit with [facility] because he had an appointment here.

## 2015-08-04 DIAGNOSIS — N2581 Secondary hyperparathyroidism of renal origin: Secondary | ICD-10-CM | POA: Diagnosis not present

## 2015-08-04 DIAGNOSIS — Z23 Encounter for immunization: Secondary | ICD-10-CM | POA: Diagnosis not present

## 2015-08-04 DIAGNOSIS — N186 End stage renal disease: Secondary | ICD-10-CM | POA: Diagnosis not present

## 2015-08-04 DIAGNOSIS — E119 Type 2 diabetes mellitus without complications: Secondary | ICD-10-CM | POA: Diagnosis not present

## 2015-08-06 DIAGNOSIS — E119 Type 2 diabetes mellitus without complications: Secondary | ICD-10-CM | POA: Diagnosis not present

## 2015-08-06 DIAGNOSIS — N2581 Secondary hyperparathyroidism of renal origin: Secondary | ICD-10-CM | POA: Diagnosis not present

## 2015-08-06 DIAGNOSIS — N186 End stage renal disease: Secondary | ICD-10-CM | POA: Diagnosis not present

## 2015-08-06 DIAGNOSIS — Z23 Encounter for immunization: Secondary | ICD-10-CM | POA: Diagnosis not present

## 2015-08-07 ENCOUNTER — Ambulatory Visit: Payer: Self-pay | Admitting: Internal Medicine

## 2015-08-07 ENCOUNTER — Other Ambulatory Visit: Payer: Self-pay | Admitting: *Deleted

## 2015-08-07 DIAGNOSIS — G8918 Other acute postprocedural pain: Secondary | ICD-10-CM

## 2015-08-07 MED ORDER — HYDROCODONE-ACETAMINOPHEN 5-325 MG PO TABS: 1.0000 | ORAL_TABLET | Freq: Four times a day (QID) | ORAL | 0 refills | Status: DC | PRN

## 2015-08-07 NOTE — Telephone Encounter (Signed)
Patient wife requested and will pick up 

## 2015-08-08 DIAGNOSIS — N2581 Secondary hyperparathyroidism of renal origin: Secondary | ICD-10-CM | POA: Diagnosis not present

## 2015-08-08 DIAGNOSIS — N186 End stage renal disease: Secondary | ICD-10-CM | POA: Diagnosis not present

## 2015-08-08 DIAGNOSIS — Z23 Encounter for immunization: Secondary | ICD-10-CM | POA: Diagnosis not present

## 2015-08-08 DIAGNOSIS — E119 Type 2 diabetes mellitus without complications: Secondary | ICD-10-CM | POA: Diagnosis not present

## 2015-08-11 DIAGNOSIS — E1122 Type 2 diabetes mellitus with diabetic chronic kidney disease: Secondary | ICD-10-CM | POA: Diagnosis not present

## 2015-08-11 DIAGNOSIS — E119 Type 2 diabetes mellitus without complications: Secondary | ICD-10-CM | POA: Diagnosis not present

## 2015-08-11 DIAGNOSIS — Z992 Dependence on renal dialysis: Secondary | ICD-10-CM | POA: Diagnosis not present

## 2015-08-11 DIAGNOSIS — N2581 Secondary hyperparathyroidism of renal origin: Secondary | ICD-10-CM | POA: Diagnosis not present

## 2015-08-11 DIAGNOSIS — N186 End stage renal disease: Secondary | ICD-10-CM | POA: Diagnosis not present

## 2015-08-11 DIAGNOSIS — Z23 Encounter for immunization: Secondary | ICD-10-CM | POA: Diagnosis not present

## 2015-08-13 DIAGNOSIS — N2581 Secondary hyperparathyroidism of renal origin: Secondary | ICD-10-CM | POA: Diagnosis not present

## 2015-08-13 DIAGNOSIS — D509 Iron deficiency anemia, unspecified: Secondary | ICD-10-CM | POA: Diagnosis not present

## 2015-08-13 DIAGNOSIS — N186 End stage renal disease: Secondary | ICD-10-CM | POA: Diagnosis not present

## 2015-08-13 DIAGNOSIS — Z23 Encounter for immunization: Secondary | ICD-10-CM | POA: Diagnosis not present

## 2015-08-13 DIAGNOSIS — E119 Type 2 diabetes mellitus without complications: Secondary | ICD-10-CM | POA: Diagnosis not present

## 2015-08-15 ENCOUNTER — Other Ambulatory Visit: Payer: Self-pay | Admitting: Nurse Practitioner

## 2015-08-15 ENCOUNTER — Other Ambulatory Visit: Payer: Self-pay | Admitting: Internal Medicine

## 2015-08-15 DIAGNOSIS — N186 End stage renal disease: Secondary | ICD-10-CM | POA: Diagnosis not present

## 2015-08-15 DIAGNOSIS — D509 Iron deficiency anemia, unspecified: Secondary | ICD-10-CM | POA: Diagnosis not present

## 2015-08-15 DIAGNOSIS — Z23 Encounter for immunization: Secondary | ICD-10-CM | POA: Diagnosis not present

## 2015-08-15 DIAGNOSIS — N400 Enlarged prostate without lower urinary tract symptoms: Secondary | ICD-10-CM

## 2015-08-15 DIAGNOSIS — N2581 Secondary hyperparathyroidism of renal origin: Secondary | ICD-10-CM | POA: Diagnosis not present

## 2015-08-15 DIAGNOSIS — I10 Essential (primary) hypertension: Secondary | ICD-10-CM

## 2015-08-15 DIAGNOSIS — E119 Type 2 diabetes mellitus without complications: Secondary | ICD-10-CM | POA: Diagnosis not present

## 2015-08-19 DIAGNOSIS — N186 End stage renal disease: Secondary | ICD-10-CM | POA: Diagnosis not present

## 2015-08-19 DIAGNOSIS — D509 Iron deficiency anemia, unspecified: Secondary | ICD-10-CM | POA: Diagnosis not present

## 2015-08-19 DIAGNOSIS — N2581 Secondary hyperparathyroidism of renal origin: Secondary | ICD-10-CM | POA: Diagnosis not present

## 2015-08-19 DIAGNOSIS — E119 Type 2 diabetes mellitus without complications: Secondary | ICD-10-CM | POA: Diagnosis not present

## 2015-08-19 DIAGNOSIS — Z23 Encounter for immunization: Secondary | ICD-10-CM | POA: Diagnosis not present

## 2015-08-20 DIAGNOSIS — N186 End stage renal disease: Secondary | ICD-10-CM | POA: Diagnosis not present

## 2015-08-20 DIAGNOSIS — N2581 Secondary hyperparathyroidism of renal origin: Secondary | ICD-10-CM | POA: Diagnosis not present

## 2015-08-20 DIAGNOSIS — E119 Type 2 diabetes mellitus without complications: Secondary | ICD-10-CM | POA: Diagnosis not present

## 2015-08-20 DIAGNOSIS — D509 Iron deficiency anemia, unspecified: Secondary | ICD-10-CM | POA: Diagnosis not present

## 2015-08-20 DIAGNOSIS — Z23 Encounter for immunization: Secondary | ICD-10-CM | POA: Diagnosis not present

## 2015-08-22 DIAGNOSIS — N2581 Secondary hyperparathyroidism of renal origin: Secondary | ICD-10-CM | POA: Diagnosis not present

## 2015-08-22 DIAGNOSIS — N186 End stage renal disease: Secondary | ICD-10-CM | POA: Diagnosis not present

## 2015-08-22 DIAGNOSIS — E119 Type 2 diabetes mellitus without complications: Secondary | ICD-10-CM | POA: Diagnosis not present

## 2015-08-22 DIAGNOSIS — D509 Iron deficiency anemia, unspecified: Secondary | ICD-10-CM | POA: Diagnosis not present

## 2015-08-22 DIAGNOSIS — Z23 Encounter for immunization: Secondary | ICD-10-CM | POA: Diagnosis not present

## 2015-08-25 DIAGNOSIS — Z23 Encounter for immunization: Secondary | ICD-10-CM | POA: Diagnosis not present

## 2015-08-25 DIAGNOSIS — N186 End stage renal disease: Secondary | ICD-10-CM | POA: Diagnosis not present

## 2015-08-25 DIAGNOSIS — N2581 Secondary hyperparathyroidism of renal origin: Secondary | ICD-10-CM | POA: Diagnosis not present

## 2015-08-25 DIAGNOSIS — D509 Iron deficiency anemia, unspecified: Secondary | ICD-10-CM | POA: Diagnosis not present

## 2015-08-25 DIAGNOSIS — E119 Type 2 diabetes mellitus without complications: Secondary | ICD-10-CM | POA: Diagnosis not present

## 2015-08-27 ENCOUNTER — Other Ambulatory Visit: Payer: Self-pay | Admitting: Internal Medicine

## 2015-08-27 DIAGNOSIS — D509 Iron deficiency anemia, unspecified: Secondary | ICD-10-CM | POA: Diagnosis not present

## 2015-08-27 DIAGNOSIS — E119 Type 2 diabetes mellitus without complications: Secondary | ICD-10-CM | POA: Diagnosis not present

## 2015-08-27 DIAGNOSIS — Z23 Encounter for immunization: Secondary | ICD-10-CM | POA: Diagnosis not present

## 2015-08-27 DIAGNOSIS — N2581 Secondary hyperparathyroidism of renal origin: Secondary | ICD-10-CM | POA: Diagnosis not present

## 2015-08-27 DIAGNOSIS — N186 End stage renal disease: Secondary | ICD-10-CM | POA: Diagnosis not present

## 2015-08-29 DIAGNOSIS — N186 End stage renal disease: Secondary | ICD-10-CM | POA: Diagnosis not present

## 2015-08-29 DIAGNOSIS — E119 Type 2 diabetes mellitus without complications: Secondary | ICD-10-CM | POA: Diagnosis not present

## 2015-08-29 DIAGNOSIS — D509 Iron deficiency anemia, unspecified: Secondary | ICD-10-CM | POA: Diagnosis not present

## 2015-08-29 DIAGNOSIS — Z23 Encounter for immunization: Secondary | ICD-10-CM | POA: Diagnosis not present

## 2015-08-29 DIAGNOSIS — N2581 Secondary hyperparathyroidism of renal origin: Secondary | ICD-10-CM | POA: Diagnosis not present

## 2015-09-01 DIAGNOSIS — E119 Type 2 diabetes mellitus without complications: Secondary | ICD-10-CM | POA: Diagnosis not present

## 2015-09-01 DIAGNOSIS — N2581 Secondary hyperparathyroidism of renal origin: Secondary | ICD-10-CM | POA: Diagnosis not present

## 2015-09-01 DIAGNOSIS — D509 Iron deficiency anemia, unspecified: Secondary | ICD-10-CM | POA: Diagnosis not present

## 2015-09-01 DIAGNOSIS — N186 End stage renal disease: Secondary | ICD-10-CM | POA: Diagnosis not present

## 2015-09-01 DIAGNOSIS — Z23 Encounter for immunization: Secondary | ICD-10-CM | POA: Diagnosis not present

## 2015-09-03 DIAGNOSIS — D509 Iron deficiency anemia, unspecified: Secondary | ICD-10-CM | POA: Diagnosis not present

## 2015-09-03 DIAGNOSIS — N2581 Secondary hyperparathyroidism of renal origin: Secondary | ICD-10-CM | POA: Diagnosis not present

## 2015-09-03 DIAGNOSIS — E119 Type 2 diabetes mellitus without complications: Secondary | ICD-10-CM | POA: Diagnosis not present

## 2015-09-03 DIAGNOSIS — N186 End stage renal disease: Secondary | ICD-10-CM | POA: Diagnosis not present

## 2015-09-03 DIAGNOSIS — Z23 Encounter for immunization: Secondary | ICD-10-CM | POA: Diagnosis not present

## 2015-09-05 ENCOUNTER — Other Ambulatory Visit: Payer: Self-pay | Admitting: *Deleted

## 2015-09-05 DIAGNOSIS — Z23 Encounter for immunization: Secondary | ICD-10-CM | POA: Diagnosis not present

## 2015-09-05 DIAGNOSIS — N186 End stage renal disease: Secondary | ICD-10-CM | POA: Diagnosis not present

## 2015-09-05 DIAGNOSIS — E119 Type 2 diabetes mellitus without complications: Secondary | ICD-10-CM | POA: Diagnosis not present

## 2015-09-05 DIAGNOSIS — G8918 Other acute postprocedural pain: Secondary | ICD-10-CM

## 2015-09-05 DIAGNOSIS — D509 Iron deficiency anemia, unspecified: Secondary | ICD-10-CM | POA: Diagnosis not present

## 2015-09-05 DIAGNOSIS — N2581 Secondary hyperparathyroidism of renal origin: Secondary | ICD-10-CM | POA: Diagnosis not present

## 2015-09-05 MED ORDER — HYDROCODONE-ACETAMINOPHEN 5-325 MG PO TABS
1.0000 | ORAL_TABLET | Freq: Four times a day (QID) | ORAL | 0 refills | Status: DC | PRN
Start: 2015-09-05 — End: 2015-10-06

## 2015-09-05 NOTE — Telephone Encounter (Signed)
Patient wife requested and will pick up 

## 2015-09-09 ENCOUNTER — Other Ambulatory Visit: Payer: Self-pay | Admitting: Internal Medicine

## 2015-09-09 DIAGNOSIS — N186 End stage renal disease: Secondary | ICD-10-CM | POA: Diagnosis not present

## 2015-09-09 DIAGNOSIS — N2581 Secondary hyperparathyroidism of renal origin: Secondary | ICD-10-CM | POA: Diagnosis not present

## 2015-09-09 DIAGNOSIS — D509 Iron deficiency anemia, unspecified: Secondary | ICD-10-CM | POA: Diagnosis not present

## 2015-09-09 DIAGNOSIS — E119 Type 2 diabetes mellitus without complications: Secondary | ICD-10-CM | POA: Diagnosis not present

## 2015-09-09 DIAGNOSIS — Z23 Encounter for immunization: Secondary | ICD-10-CM | POA: Diagnosis not present

## 2015-09-10 DIAGNOSIS — N2581 Secondary hyperparathyroidism of renal origin: Secondary | ICD-10-CM | POA: Diagnosis not present

## 2015-09-10 DIAGNOSIS — N186 End stage renal disease: Secondary | ICD-10-CM | POA: Diagnosis not present

## 2015-09-10 DIAGNOSIS — Z23 Encounter for immunization: Secondary | ICD-10-CM | POA: Diagnosis not present

## 2015-09-10 DIAGNOSIS — D509 Iron deficiency anemia, unspecified: Secondary | ICD-10-CM | POA: Diagnosis not present

## 2015-09-10 DIAGNOSIS — E119 Type 2 diabetes mellitus without complications: Secondary | ICD-10-CM | POA: Diagnosis not present

## 2015-09-11 DIAGNOSIS — N186 End stage renal disease: Secondary | ICD-10-CM | POA: Diagnosis not present

## 2015-09-11 DIAGNOSIS — E1122 Type 2 diabetes mellitus with diabetic chronic kidney disease: Secondary | ICD-10-CM | POA: Diagnosis not present

## 2015-09-11 DIAGNOSIS — Z992 Dependence on renal dialysis: Secondary | ICD-10-CM | POA: Diagnosis not present

## 2015-09-12 DIAGNOSIS — D631 Anemia in chronic kidney disease: Secondary | ICD-10-CM | POA: Diagnosis not present

## 2015-09-12 DIAGNOSIS — N2581 Secondary hyperparathyroidism of renal origin: Secondary | ICD-10-CM | POA: Diagnosis not present

## 2015-09-12 DIAGNOSIS — E119 Type 2 diabetes mellitus without complications: Secondary | ICD-10-CM | POA: Diagnosis not present

## 2015-09-12 DIAGNOSIS — Z23 Encounter for immunization: Secondary | ICD-10-CM | POA: Diagnosis not present

## 2015-09-12 DIAGNOSIS — D509 Iron deficiency anemia, unspecified: Secondary | ICD-10-CM | POA: Diagnosis not present

## 2015-09-12 DIAGNOSIS — N186 End stage renal disease: Secondary | ICD-10-CM | POA: Diagnosis not present

## 2015-09-15 DIAGNOSIS — E119 Type 2 diabetes mellitus without complications: Secondary | ICD-10-CM | POA: Diagnosis not present

## 2015-09-15 DIAGNOSIS — N186 End stage renal disease: Secondary | ICD-10-CM | POA: Diagnosis not present

## 2015-09-15 DIAGNOSIS — D631 Anemia in chronic kidney disease: Secondary | ICD-10-CM | POA: Diagnosis not present

## 2015-09-15 DIAGNOSIS — D509 Iron deficiency anemia, unspecified: Secondary | ICD-10-CM | POA: Diagnosis not present

## 2015-09-15 DIAGNOSIS — N2581 Secondary hyperparathyroidism of renal origin: Secondary | ICD-10-CM | POA: Diagnosis not present

## 2015-09-15 DIAGNOSIS — Z23 Encounter for immunization: Secondary | ICD-10-CM | POA: Diagnosis not present

## 2015-09-17 DIAGNOSIS — N2581 Secondary hyperparathyroidism of renal origin: Secondary | ICD-10-CM | POA: Diagnosis not present

## 2015-09-17 DIAGNOSIS — D509 Iron deficiency anemia, unspecified: Secondary | ICD-10-CM | POA: Diagnosis not present

## 2015-09-17 DIAGNOSIS — D631 Anemia in chronic kidney disease: Secondary | ICD-10-CM | POA: Diagnosis not present

## 2015-09-17 DIAGNOSIS — E119 Type 2 diabetes mellitus without complications: Secondary | ICD-10-CM | POA: Diagnosis not present

## 2015-09-17 DIAGNOSIS — Z23 Encounter for immunization: Secondary | ICD-10-CM | POA: Diagnosis not present

## 2015-09-17 DIAGNOSIS — N186 End stage renal disease: Secondary | ICD-10-CM | POA: Diagnosis not present

## 2015-09-19 ENCOUNTER — Encounter: Payer: Self-pay | Admitting: Vascular Surgery

## 2015-09-19 DIAGNOSIS — N2581 Secondary hyperparathyroidism of renal origin: Secondary | ICD-10-CM | POA: Diagnosis not present

## 2015-09-19 DIAGNOSIS — D509 Iron deficiency anemia, unspecified: Secondary | ICD-10-CM | POA: Diagnosis not present

## 2015-09-19 DIAGNOSIS — D631 Anemia in chronic kidney disease: Secondary | ICD-10-CM | POA: Diagnosis not present

## 2015-09-19 DIAGNOSIS — E119 Type 2 diabetes mellitus without complications: Secondary | ICD-10-CM | POA: Diagnosis not present

## 2015-09-19 DIAGNOSIS — N186 End stage renal disease: Secondary | ICD-10-CM | POA: Diagnosis not present

## 2015-09-19 DIAGNOSIS — Z23 Encounter for immunization: Secondary | ICD-10-CM | POA: Diagnosis not present

## 2015-09-24 DIAGNOSIS — D631 Anemia in chronic kidney disease: Secondary | ICD-10-CM | POA: Diagnosis not present

## 2015-09-24 DIAGNOSIS — D509 Iron deficiency anemia, unspecified: Secondary | ICD-10-CM | POA: Diagnosis not present

## 2015-09-24 DIAGNOSIS — N2581 Secondary hyperparathyroidism of renal origin: Secondary | ICD-10-CM | POA: Diagnosis not present

## 2015-09-24 DIAGNOSIS — Z23 Encounter for immunization: Secondary | ICD-10-CM | POA: Diagnosis not present

## 2015-09-24 DIAGNOSIS — E119 Type 2 diabetes mellitus without complications: Secondary | ICD-10-CM | POA: Diagnosis not present

## 2015-09-24 DIAGNOSIS — N186 End stage renal disease: Secondary | ICD-10-CM | POA: Diagnosis not present

## 2015-09-26 ENCOUNTER — Ambulatory Visit (HOSPITAL_COMMUNITY): Payer: Medicare Other

## 2015-09-26 ENCOUNTER — Ambulatory Visit: Payer: Medicare Other | Admitting: Vascular Surgery

## 2015-09-26 DIAGNOSIS — N186 End stage renal disease: Secondary | ICD-10-CM | POA: Diagnosis not present

## 2015-09-26 DIAGNOSIS — N2581 Secondary hyperparathyroidism of renal origin: Secondary | ICD-10-CM | POA: Diagnosis not present

## 2015-09-26 DIAGNOSIS — D509 Iron deficiency anemia, unspecified: Secondary | ICD-10-CM | POA: Diagnosis not present

## 2015-09-26 DIAGNOSIS — D631 Anemia in chronic kidney disease: Secondary | ICD-10-CM | POA: Diagnosis not present

## 2015-09-26 DIAGNOSIS — E119 Type 2 diabetes mellitus without complications: Secondary | ICD-10-CM | POA: Diagnosis not present

## 2015-09-26 DIAGNOSIS — Z23 Encounter for immunization: Secondary | ICD-10-CM | POA: Diagnosis not present

## 2015-09-29 DIAGNOSIS — E119 Type 2 diabetes mellitus without complications: Secondary | ICD-10-CM | POA: Diagnosis not present

## 2015-09-29 DIAGNOSIS — D509 Iron deficiency anemia, unspecified: Secondary | ICD-10-CM | POA: Diagnosis not present

## 2015-09-29 DIAGNOSIS — D631 Anemia in chronic kidney disease: Secondary | ICD-10-CM | POA: Diagnosis not present

## 2015-09-29 DIAGNOSIS — N2581 Secondary hyperparathyroidism of renal origin: Secondary | ICD-10-CM | POA: Diagnosis not present

## 2015-09-29 DIAGNOSIS — Z23 Encounter for immunization: Secondary | ICD-10-CM | POA: Diagnosis not present

## 2015-09-29 DIAGNOSIS — N186 End stage renal disease: Secondary | ICD-10-CM | POA: Diagnosis not present

## 2015-10-01 DIAGNOSIS — E119 Type 2 diabetes mellitus without complications: Secondary | ICD-10-CM | POA: Diagnosis not present

## 2015-10-01 DIAGNOSIS — N2581 Secondary hyperparathyroidism of renal origin: Secondary | ICD-10-CM | POA: Diagnosis not present

## 2015-10-01 DIAGNOSIS — Z23 Encounter for immunization: Secondary | ICD-10-CM | POA: Diagnosis not present

## 2015-10-01 DIAGNOSIS — D509 Iron deficiency anemia, unspecified: Secondary | ICD-10-CM | POA: Diagnosis not present

## 2015-10-01 DIAGNOSIS — D631 Anemia in chronic kidney disease: Secondary | ICD-10-CM | POA: Diagnosis not present

## 2015-10-01 DIAGNOSIS — N186 End stage renal disease: Secondary | ICD-10-CM | POA: Diagnosis not present

## 2015-10-02 ENCOUNTER — Other Ambulatory Visit: Payer: Self-pay | Admitting: Internal Medicine

## 2015-10-03 DIAGNOSIS — D509 Iron deficiency anemia, unspecified: Secondary | ICD-10-CM | POA: Diagnosis not present

## 2015-10-03 DIAGNOSIS — D631 Anemia in chronic kidney disease: Secondary | ICD-10-CM | POA: Diagnosis not present

## 2015-10-03 DIAGNOSIS — E119 Type 2 diabetes mellitus without complications: Secondary | ICD-10-CM | POA: Diagnosis not present

## 2015-10-03 DIAGNOSIS — Z23 Encounter for immunization: Secondary | ICD-10-CM | POA: Diagnosis not present

## 2015-10-03 DIAGNOSIS — N186 End stage renal disease: Secondary | ICD-10-CM | POA: Diagnosis not present

## 2015-10-03 DIAGNOSIS — N2581 Secondary hyperparathyroidism of renal origin: Secondary | ICD-10-CM | POA: Diagnosis not present

## 2015-10-06 ENCOUNTER — Telehealth: Payer: Self-pay

## 2015-10-06 ENCOUNTER — Other Ambulatory Visit: Payer: Self-pay | Admitting: *Deleted

## 2015-10-06 DIAGNOSIS — N2581 Secondary hyperparathyroidism of renal origin: Secondary | ICD-10-CM | POA: Diagnosis not present

## 2015-10-06 DIAGNOSIS — D509 Iron deficiency anemia, unspecified: Secondary | ICD-10-CM | POA: Diagnosis not present

## 2015-10-06 DIAGNOSIS — E119 Type 2 diabetes mellitus without complications: Secondary | ICD-10-CM | POA: Diagnosis not present

## 2015-10-06 DIAGNOSIS — N186 End stage renal disease: Secondary | ICD-10-CM | POA: Diagnosis not present

## 2015-10-06 DIAGNOSIS — Z23 Encounter for immunization: Secondary | ICD-10-CM | POA: Diagnosis not present

## 2015-10-06 DIAGNOSIS — G8918 Other acute postprocedural pain: Secondary | ICD-10-CM

## 2015-10-06 DIAGNOSIS — D631 Anemia in chronic kidney disease: Secondary | ICD-10-CM | POA: Diagnosis not present

## 2015-10-06 MED ORDER — HYDROCODONE-ACETAMINOPHEN 5-325 MG PO TABS: 1.0000 | ORAL_TABLET | Freq: Four times a day (QID) | ORAL | 0 refills | Status: DC | PRN

## 2015-10-06 NOTE — Telephone Encounter (Signed)
I spoke to patient's wife to let her know that there is a prescription for hydrocodone/APAP 5-325 mg tablets ready to pick up at the office. Prescription was placed in filing cabinet at front dest.

## 2015-10-06 NOTE — Telephone Encounter (Signed)
Patient requested and will pick up 

## 2015-10-08 DIAGNOSIS — D631 Anemia in chronic kidney disease: Secondary | ICD-10-CM | POA: Diagnosis not present

## 2015-10-08 DIAGNOSIS — N186 End stage renal disease: Secondary | ICD-10-CM | POA: Diagnosis not present

## 2015-10-08 DIAGNOSIS — Z23 Encounter for immunization: Secondary | ICD-10-CM | POA: Diagnosis not present

## 2015-10-08 DIAGNOSIS — D509 Iron deficiency anemia, unspecified: Secondary | ICD-10-CM | POA: Diagnosis not present

## 2015-10-08 DIAGNOSIS — E119 Type 2 diabetes mellitus without complications: Secondary | ICD-10-CM | POA: Diagnosis not present

## 2015-10-08 DIAGNOSIS — N2581 Secondary hyperparathyroidism of renal origin: Secondary | ICD-10-CM | POA: Diagnosis not present

## 2015-10-10 DIAGNOSIS — Z23 Encounter for immunization: Secondary | ICD-10-CM | POA: Diagnosis not present

## 2015-10-10 DIAGNOSIS — D509 Iron deficiency anemia, unspecified: Secondary | ICD-10-CM | POA: Diagnosis not present

## 2015-10-10 DIAGNOSIS — N186 End stage renal disease: Secondary | ICD-10-CM | POA: Diagnosis not present

## 2015-10-10 DIAGNOSIS — D631 Anemia in chronic kidney disease: Secondary | ICD-10-CM | POA: Diagnosis not present

## 2015-10-10 DIAGNOSIS — N2581 Secondary hyperparathyroidism of renal origin: Secondary | ICD-10-CM | POA: Diagnosis not present

## 2015-10-10 DIAGNOSIS — E119 Type 2 diabetes mellitus without complications: Secondary | ICD-10-CM | POA: Diagnosis not present

## 2015-10-11 DIAGNOSIS — E1122 Type 2 diabetes mellitus with diabetic chronic kidney disease: Secondary | ICD-10-CM | POA: Diagnosis not present

## 2015-10-11 DIAGNOSIS — N186 End stage renal disease: Secondary | ICD-10-CM | POA: Diagnosis not present

## 2015-10-11 DIAGNOSIS — Z992 Dependence on renal dialysis: Secondary | ICD-10-CM | POA: Diagnosis not present

## 2015-10-13 DIAGNOSIS — D509 Iron deficiency anemia, unspecified: Secondary | ICD-10-CM | POA: Diagnosis not present

## 2015-10-13 DIAGNOSIS — R739 Hyperglycemia, unspecified: Secondary | ICD-10-CM | POA: Diagnosis not present

## 2015-10-13 DIAGNOSIS — D631 Anemia in chronic kidney disease: Secondary | ICD-10-CM | POA: Diagnosis not present

## 2015-10-13 DIAGNOSIS — N2581 Secondary hyperparathyroidism of renal origin: Secondary | ICD-10-CM | POA: Diagnosis not present

## 2015-10-13 DIAGNOSIS — N186 End stage renal disease: Secondary | ICD-10-CM | POA: Diagnosis not present

## 2015-10-15 DIAGNOSIS — N2581 Secondary hyperparathyroidism of renal origin: Secondary | ICD-10-CM | POA: Diagnosis not present

## 2015-10-15 DIAGNOSIS — D509 Iron deficiency anemia, unspecified: Secondary | ICD-10-CM | POA: Diagnosis not present

## 2015-10-15 DIAGNOSIS — N186 End stage renal disease: Secondary | ICD-10-CM | POA: Diagnosis not present

## 2015-10-15 DIAGNOSIS — D631 Anemia in chronic kidney disease: Secondary | ICD-10-CM | POA: Diagnosis not present

## 2015-10-15 DIAGNOSIS — R739 Hyperglycemia, unspecified: Secondary | ICD-10-CM | POA: Diagnosis not present

## 2015-10-17 DIAGNOSIS — R739 Hyperglycemia, unspecified: Secondary | ICD-10-CM | POA: Diagnosis not present

## 2015-10-17 DIAGNOSIS — N2581 Secondary hyperparathyroidism of renal origin: Secondary | ICD-10-CM | POA: Diagnosis not present

## 2015-10-17 DIAGNOSIS — D631 Anemia in chronic kidney disease: Secondary | ICD-10-CM | POA: Diagnosis not present

## 2015-10-17 DIAGNOSIS — N186 End stage renal disease: Secondary | ICD-10-CM | POA: Diagnosis not present

## 2015-10-17 DIAGNOSIS — D509 Iron deficiency anemia, unspecified: Secondary | ICD-10-CM | POA: Diagnosis not present

## 2015-10-20 DIAGNOSIS — R739 Hyperglycemia, unspecified: Secondary | ICD-10-CM | POA: Diagnosis not present

## 2015-10-20 DIAGNOSIS — N186 End stage renal disease: Secondary | ICD-10-CM | POA: Diagnosis not present

## 2015-10-20 DIAGNOSIS — N2581 Secondary hyperparathyroidism of renal origin: Secondary | ICD-10-CM | POA: Diagnosis not present

## 2015-10-20 DIAGNOSIS — D509 Iron deficiency anemia, unspecified: Secondary | ICD-10-CM | POA: Diagnosis not present

## 2015-10-20 DIAGNOSIS — D631 Anemia in chronic kidney disease: Secondary | ICD-10-CM | POA: Diagnosis not present

## 2015-10-22 DIAGNOSIS — N2581 Secondary hyperparathyroidism of renal origin: Secondary | ICD-10-CM | POA: Diagnosis not present

## 2015-10-22 DIAGNOSIS — D631 Anemia in chronic kidney disease: Secondary | ICD-10-CM | POA: Diagnosis not present

## 2015-10-22 DIAGNOSIS — N186 End stage renal disease: Secondary | ICD-10-CM | POA: Diagnosis not present

## 2015-10-22 DIAGNOSIS — D509 Iron deficiency anemia, unspecified: Secondary | ICD-10-CM | POA: Diagnosis not present

## 2015-10-22 DIAGNOSIS — R739 Hyperglycemia, unspecified: Secondary | ICD-10-CM | POA: Diagnosis not present

## 2015-10-24 DIAGNOSIS — R739 Hyperglycemia, unspecified: Secondary | ICD-10-CM | POA: Diagnosis not present

## 2015-10-24 DIAGNOSIS — D509 Iron deficiency anemia, unspecified: Secondary | ICD-10-CM | POA: Diagnosis not present

## 2015-10-24 DIAGNOSIS — N2581 Secondary hyperparathyroidism of renal origin: Secondary | ICD-10-CM | POA: Diagnosis not present

## 2015-10-24 DIAGNOSIS — N186 End stage renal disease: Secondary | ICD-10-CM | POA: Diagnosis not present

## 2015-10-24 DIAGNOSIS — D631 Anemia in chronic kidney disease: Secondary | ICD-10-CM | POA: Diagnosis not present

## 2015-10-27 ENCOUNTER — Encounter: Payer: Self-pay | Admitting: Vascular Surgery

## 2015-10-27 DIAGNOSIS — R739 Hyperglycemia, unspecified: Secondary | ICD-10-CM | POA: Diagnosis not present

## 2015-10-27 DIAGNOSIS — D631 Anemia in chronic kidney disease: Secondary | ICD-10-CM | POA: Diagnosis not present

## 2015-10-27 DIAGNOSIS — D509 Iron deficiency anemia, unspecified: Secondary | ICD-10-CM | POA: Diagnosis not present

## 2015-10-27 DIAGNOSIS — N186 End stage renal disease: Secondary | ICD-10-CM | POA: Diagnosis not present

## 2015-10-27 DIAGNOSIS — N2581 Secondary hyperparathyroidism of renal origin: Secondary | ICD-10-CM | POA: Diagnosis not present

## 2015-10-29 DIAGNOSIS — N2581 Secondary hyperparathyroidism of renal origin: Secondary | ICD-10-CM | POA: Diagnosis not present

## 2015-10-29 DIAGNOSIS — D509 Iron deficiency anemia, unspecified: Secondary | ICD-10-CM | POA: Diagnosis not present

## 2015-10-29 DIAGNOSIS — D631 Anemia in chronic kidney disease: Secondary | ICD-10-CM | POA: Diagnosis not present

## 2015-10-29 DIAGNOSIS — R739 Hyperglycemia, unspecified: Secondary | ICD-10-CM | POA: Diagnosis not present

## 2015-10-29 DIAGNOSIS — N186 End stage renal disease: Secondary | ICD-10-CM | POA: Diagnosis not present

## 2015-10-30 ENCOUNTER — Ambulatory Visit (INDEPENDENT_AMBULATORY_CARE_PROVIDER_SITE_OTHER): Payer: Medicare Other | Admitting: Vascular Surgery

## 2015-10-30 ENCOUNTER — Encounter: Payer: Self-pay | Admitting: Vascular Surgery

## 2015-10-30 ENCOUNTER — Ambulatory Visit (HOSPITAL_COMMUNITY)
Admission: RE | Admit: 2015-10-30 | Discharge: 2015-10-30 | Disposition: A | Discharge status: Home / Self Care | Payer: Medicare Other | Source: Ambulatory Visit | Attending: Vascular Surgery | Admitting: Vascular Surgery

## 2015-10-30 VITALS — BP 200/81 | HR 83 | Temp 98.7°F | Resp 16 | Ht 68.0 in | Wt 170.0 lb

## 2015-10-30 DIAGNOSIS — I6523 Occlusion and stenosis of bilateral carotid arteries: Secondary | ICD-10-CM | POA: Diagnosis not present

## 2015-10-30 DIAGNOSIS — N186 End stage renal disease: Secondary | ICD-10-CM | POA: Insufficient documentation

## 2015-10-30 DIAGNOSIS — Z992 Dependence on renal dialysis: Secondary | ICD-10-CM | POA: Diagnosis not present

## 2015-10-30 LAB — VAS US CAROTID
LCCADDIAS: -7 cm/s
LCCAPDIAS: 7 cm/s
LCCAPSYS: 66 cm/s
LEFT ECA DIAS: -1 cm/s
LICADSYS: -76 cm/s
LICAPSYS: -39 cm/s
Left CCA dist sys: -52 cm/s
Left ICA dist dias: -17 cm/s
Left ICA prox dias: -8 cm/s
RCCAPSYS: 77 cm/s
RIGHT CCA MID DIAS: 13 cm/s
RIGHT ECA DIAS: 0 cm/s
Right CCA prox dias: 8 cm/s
Right cca dist sys: -71 cm/s

## 2015-10-30 NOTE — Progress Notes (Signed)
Patient is a 75 year old male who returns today for follow-up of his carotid occlusive disease. He underwent right carotid endarterectomy in 2005, left carotid endarterectomy in 2016. He denies any symptoms of TIA amaurosis or stroke. Of note he has also undergone bilateral below-knee amputations. He also has a left arm AV fistula for his hemodialysis access. He reports overall he has been feeling well.  Review of systems: He denies shortness of breath. He denies chest pain.  Physical exam:  Vitals:   10/30/15 1103 10/30/15 1112  BP: (!) 204/86 (!) 200/81  Pulse: 83   Resp: 16   Temp: 98.7 F (37.1 C)   TempSrc: Oral   SpO2: 100%   Weight: 170 lb (77.1 kg)   Height: 5\' 8"  (1.727 m)     Extremities:. Left upper arm AV fistula. Bilateral well-healed below-knee amputations  Neck: Bilateral carotid bruits  Cardiac: Regular rate and rhythm with 3/6 systolic murmur heard best in the left second interspace  Chest: Clear to auscultation bilaterally  Data: Patient had bilateral carotid duplex exam today which I reviewed and interpreted. This showed bilateral widely patent carotid endarterectomy sites. Retrograde right vertebral artery flow. Antegrade left vertebral artery flow.  Assessment: Doing well status post bilateral carotid endarterectomies  Plan: Follow-up one year repeat carotid duplex exam.  Ruta Hinds, MD [facility] Office: [phone] Pager: [phone]

## 2015-10-31 DIAGNOSIS — R739 Hyperglycemia, unspecified: Secondary | ICD-10-CM | POA: Diagnosis not present

## 2015-10-31 DIAGNOSIS — N2581 Secondary hyperparathyroidism of renal origin: Secondary | ICD-10-CM | POA: Diagnosis not present

## 2015-10-31 DIAGNOSIS — D631 Anemia in chronic kidney disease: Secondary | ICD-10-CM | POA: Diagnosis not present

## 2015-10-31 DIAGNOSIS — D509 Iron deficiency anemia, unspecified: Secondary | ICD-10-CM | POA: Diagnosis not present

## 2015-10-31 DIAGNOSIS — N186 End stage renal disease: Secondary | ICD-10-CM | POA: Diagnosis not present

## 2015-11-03 DIAGNOSIS — N2581 Secondary hyperparathyroidism of renal origin: Secondary | ICD-10-CM | POA: Diagnosis not present

## 2015-11-03 DIAGNOSIS — D509 Iron deficiency anemia, unspecified: Secondary | ICD-10-CM | POA: Diagnosis not present

## 2015-11-03 DIAGNOSIS — D631 Anemia in chronic kidney disease: Secondary | ICD-10-CM | POA: Diagnosis not present

## 2015-11-03 DIAGNOSIS — R739 Hyperglycemia, unspecified: Secondary | ICD-10-CM | POA: Diagnosis not present

## 2015-11-03 DIAGNOSIS — N186 End stage renal disease: Secondary | ICD-10-CM | POA: Diagnosis not present

## 2015-11-05 ENCOUNTER — Other Ambulatory Visit: Payer: Self-pay | Admitting: *Deleted

## 2015-11-05 DIAGNOSIS — G8918 Other acute postprocedural pain: Secondary | ICD-10-CM

## 2015-11-05 DIAGNOSIS — D631 Anemia in chronic kidney disease: Secondary | ICD-10-CM | POA: Diagnosis not present

## 2015-11-05 DIAGNOSIS — N186 End stage renal disease: Secondary | ICD-10-CM | POA: Diagnosis not present

## 2015-11-05 DIAGNOSIS — R739 Hyperglycemia, unspecified: Secondary | ICD-10-CM | POA: Diagnosis not present

## 2015-11-05 DIAGNOSIS — E119 Type 2 diabetes mellitus without complications: Secondary | ICD-10-CM | POA: Diagnosis not present

## 2015-11-05 DIAGNOSIS — D509 Iron deficiency anemia, unspecified: Secondary | ICD-10-CM | POA: Diagnosis not present

## 2015-11-05 DIAGNOSIS — N2581 Secondary hyperparathyroidism of renal origin: Secondary | ICD-10-CM | POA: Diagnosis not present

## 2015-11-05 MED ORDER — HYDROCODONE-ACETAMINOPHEN 5-325 MG PO TABS: 1.0000 | ORAL_TABLET | Freq: Four times a day (QID) | ORAL | 0 refills | Status: DC | PRN

## 2015-11-05 NOTE — Telephone Encounter (Signed)
Patient wife requested and will pick up 

## 2015-11-07 DIAGNOSIS — R739 Hyperglycemia, unspecified: Secondary | ICD-10-CM | POA: Diagnosis not present

## 2015-11-07 DIAGNOSIS — N186 End stage renal disease: Secondary | ICD-10-CM | POA: Diagnosis not present

## 2015-11-07 DIAGNOSIS — D631 Anemia in chronic kidney disease: Secondary | ICD-10-CM | POA: Diagnosis not present

## 2015-11-07 DIAGNOSIS — N2581 Secondary hyperparathyroidism of renal origin: Secondary | ICD-10-CM | POA: Diagnosis not present

## 2015-11-07 DIAGNOSIS — D509 Iron deficiency anemia, unspecified: Secondary | ICD-10-CM | POA: Diagnosis not present

## 2015-11-10 DIAGNOSIS — N2581 Secondary hyperparathyroidism of renal origin: Secondary | ICD-10-CM | POA: Diagnosis not present

## 2015-11-10 DIAGNOSIS — N186 End stage renal disease: Secondary | ICD-10-CM | POA: Diagnosis not present

## 2015-11-10 DIAGNOSIS — R739 Hyperglycemia, unspecified: Secondary | ICD-10-CM | POA: Diagnosis not present

## 2015-11-10 DIAGNOSIS — D631 Anemia in chronic kidney disease: Secondary | ICD-10-CM | POA: Diagnosis not present

## 2015-11-10 DIAGNOSIS — D509 Iron deficiency anemia, unspecified: Secondary | ICD-10-CM | POA: Diagnosis not present

## 2015-11-11 DIAGNOSIS — N186 End stage renal disease: Secondary | ICD-10-CM | POA: Diagnosis not present

## 2015-11-11 DIAGNOSIS — Z992 Dependence on renal dialysis: Secondary | ICD-10-CM | POA: Diagnosis not present

## 2015-11-11 DIAGNOSIS — E1122 Type 2 diabetes mellitus with diabetic chronic kidney disease: Secondary | ICD-10-CM | POA: Diagnosis not present

## 2015-11-13 DIAGNOSIS — D509 Iron deficiency anemia, unspecified: Secondary | ICD-10-CM | POA: Diagnosis not present

## 2015-11-13 DIAGNOSIS — D631 Anemia in chronic kidney disease: Secondary | ICD-10-CM | POA: Diagnosis not present

## 2015-11-13 DIAGNOSIS — E119 Type 2 diabetes mellitus without complications: Secondary | ICD-10-CM | POA: Diagnosis not present

## 2015-11-13 DIAGNOSIS — N186 End stage renal disease: Secondary | ICD-10-CM | POA: Diagnosis not present

## 2015-11-13 DIAGNOSIS — R739 Hyperglycemia, unspecified: Secondary | ICD-10-CM | POA: Diagnosis not present

## 2015-11-13 DIAGNOSIS — N2581 Secondary hyperparathyroidism of renal origin: Secondary | ICD-10-CM | POA: Diagnosis not present

## 2015-11-14 DIAGNOSIS — R739 Hyperglycemia, unspecified: Secondary | ICD-10-CM | POA: Diagnosis not present

## 2015-11-14 DIAGNOSIS — N186 End stage renal disease: Secondary | ICD-10-CM | POA: Diagnosis not present

## 2015-11-14 DIAGNOSIS — E119 Type 2 diabetes mellitus without complications: Secondary | ICD-10-CM | POA: Diagnosis not present

## 2015-11-14 DIAGNOSIS — D509 Iron deficiency anemia, unspecified: Secondary | ICD-10-CM | POA: Diagnosis not present

## 2015-11-14 DIAGNOSIS — D631 Anemia in chronic kidney disease: Secondary | ICD-10-CM | POA: Diagnosis not present

## 2015-11-14 DIAGNOSIS — N2581 Secondary hyperparathyroidism of renal origin: Secondary | ICD-10-CM | POA: Diagnosis not present

## 2015-11-17 DIAGNOSIS — D509 Iron deficiency anemia, unspecified: Secondary | ICD-10-CM | POA: Diagnosis not present

## 2015-11-17 DIAGNOSIS — N2581 Secondary hyperparathyroidism of renal origin: Secondary | ICD-10-CM | POA: Diagnosis not present

## 2015-11-17 DIAGNOSIS — R739 Hyperglycemia, unspecified: Secondary | ICD-10-CM | POA: Diagnosis not present

## 2015-11-17 DIAGNOSIS — D631 Anemia in chronic kidney disease: Secondary | ICD-10-CM | POA: Diagnosis not present

## 2015-11-17 DIAGNOSIS — N186 End stage renal disease: Secondary | ICD-10-CM | POA: Diagnosis not present

## 2015-11-17 DIAGNOSIS — E119 Type 2 diabetes mellitus without complications: Secondary | ICD-10-CM | POA: Diagnosis not present

## 2015-11-19 DIAGNOSIS — D509 Iron deficiency anemia, unspecified: Secondary | ICD-10-CM | POA: Diagnosis not present

## 2015-11-19 DIAGNOSIS — R739 Hyperglycemia, unspecified: Secondary | ICD-10-CM | POA: Diagnosis not present

## 2015-11-19 DIAGNOSIS — N186 End stage renal disease: Secondary | ICD-10-CM | POA: Diagnosis not present

## 2015-11-19 DIAGNOSIS — N2581 Secondary hyperparathyroidism of renal origin: Secondary | ICD-10-CM | POA: Diagnosis not present

## 2015-11-19 DIAGNOSIS — D631 Anemia in chronic kidney disease: Secondary | ICD-10-CM | POA: Diagnosis not present

## 2015-11-19 DIAGNOSIS — E119 Type 2 diabetes mellitus without complications: Secondary | ICD-10-CM | POA: Diagnosis not present

## 2015-11-21 DIAGNOSIS — N2581 Secondary hyperparathyroidism of renal origin: Secondary | ICD-10-CM | POA: Diagnosis not present

## 2015-11-21 DIAGNOSIS — R739 Hyperglycemia, unspecified: Secondary | ICD-10-CM | POA: Diagnosis not present

## 2015-11-21 DIAGNOSIS — E119 Type 2 diabetes mellitus without complications: Secondary | ICD-10-CM | POA: Diagnosis not present

## 2015-11-21 DIAGNOSIS — N186 End stage renal disease: Secondary | ICD-10-CM | POA: Diagnosis not present

## 2015-11-21 DIAGNOSIS — D631 Anemia in chronic kidney disease: Secondary | ICD-10-CM | POA: Diagnosis not present

## 2015-11-21 DIAGNOSIS — D509 Iron deficiency anemia, unspecified: Secondary | ICD-10-CM | POA: Diagnosis not present

## 2015-11-24 DIAGNOSIS — E119 Type 2 diabetes mellitus without complications: Secondary | ICD-10-CM | POA: Diagnosis not present

## 2015-11-24 DIAGNOSIS — N2581 Secondary hyperparathyroidism of renal origin: Secondary | ICD-10-CM | POA: Diagnosis not present

## 2015-11-24 DIAGNOSIS — D631 Anemia in chronic kidney disease: Secondary | ICD-10-CM | POA: Diagnosis not present

## 2015-11-24 DIAGNOSIS — N186 End stage renal disease: Secondary | ICD-10-CM | POA: Diagnosis not present

## 2015-11-24 DIAGNOSIS — D509 Iron deficiency anemia, unspecified: Secondary | ICD-10-CM | POA: Diagnosis not present

## 2015-11-24 DIAGNOSIS — R739 Hyperglycemia, unspecified: Secondary | ICD-10-CM | POA: Diagnosis not present

## 2015-11-25 ENCOUNTER — Telehealth: Payer: Self-pay | Admitting: Internal Medicine

## 2015-11-25 NOTE — Addendum Note (Signed)
Addended by: Mena Goes on: 11/25/2015 04:16 PM   Modules accepted: Orders

## 2015-11-25 NOTE — Telephone Encounter (Signed)
left msg asking pt to confirm this AWV appt w/ nurse. VDM (DD) °

## 2015-11-26 DIAGNOSIS — E119 Type 2 diabetes mellitus without complications: Secondary | ICD-10-CM | POA: Diagnosis not present

## 2015-11-26 DIAGNOSIS — R739 Hyperglycemia, unspecified: Secondary | ICD-10-CM | POA: Diagnosis not present

## 2015-11-26 DIAGNOSIS — D631 Anemia in chronic kidney disease: Secondary | ICD-10-CM | POA: Diagnosis not present

## 2015-11-26 DIAGNOSIS — N186 End stage renal disease: Secondary | ICD-10-CM | POA: Diagnosis not present

## 2015-11-26 DIAGNOSIS — N2581 Secondary hyperparathyroidism of renal origin: Secondary | ICD-10-CM | POA: Diagnosis not present

## 2015-11-26 DIAGNOSIS — D509 Iron deficiency anemia, unspecified: Secondary | ICD-10-CM | POA: Diagnosis not present

## 2015-11-27 ENCOUNTER — Other Ambulatory Visit: Payer: Self-pay | Admitting: *Deleted

## 2015-11-27 DIAGNOSIS — I10 Essential (primary) hypertension: Secondary | ICD-10-CM

## 2015-11-27 DIAGNOSIS — N186 End stage renal disease: Secondary | ICD-10-CM | POA: Diagnosis not present

## 2015-11-27 DIAGNOSIS — Z992 Dependence on renal dialysis: Secondary | ICD-10-CM | POA: Diagnosis not present

## 2015-11-27 DIAGNOSIS — I871 Compression of vein: Secondary | ICD-10-CM | POA: Diagnosis not present

## 2015-11-27 DIAGNOSIS — G546 Phantom limb syndrome with pain: Secondary | ICD-10-CM

## 2015-11-27 MED ORDER — AMLODIPINE BESYLATE 10 MG PO TABS: ORAL_TABLET | ORAL | 0 refills | Status: DC

## 2015-11-27 MED ORDER — PRAVASTATIN SODIUM 40 MG PO TABS: ORAL_TABLET | ORAL | 0 refills | Status: DC

## 2015-11-27 MED ORDER — CLOPIDOGREL BISULFATE 75 MG PO TABS: ORAL_TABLET | ORAL | 0 refills | Status: DC

## 2015-11-27 MED ORDER — PREGABALIN 75 MG PO CAPS: ORAL_CAPSULE | ORAL | 0 refills | Status: DC

## 2015-11-27 MED ORDER — QUETIAPINE FUMARATE 25 MG PO TABS: 25.0000 mg | ORAL_TABLET | Freq: Every day | ORAL | 0 refills | Status: DC

## 2015-11-27 MED ORDER — TAMSULOSIN HCL 0.4 MG PO CAPS: ORAL_CAPSULE | ORAL | 0 refills | Status: DC

## 2015-11-27 NOTE — Telephone Encounter (Signed)
Patient wife requested Rx to be faxed to pharmacy.

## 2015-11-28 DIAGNOSIS — D509 Iron deficiency anemia, unspecified: Secondary | ICD-10-CM | POA: Diagnosis not present

## 2015-11-28 DIAGNOSIS — R739 Hyperglycemia, unspecified: Secondary | ICD-10-CM | POA: Diagnosis not present

## 2015-11-28 DIAGNOSIS — N2581 Secondary hyperparathyroidism of renal origin: Secondary | ICD-10-CM | POA: Diagnosis not present

## 2015-11-28 DIAGNOSIS — D631 Anemia in chronic kidney disease: Secondary | ICD-10-CM | POA: Diagnosis not present

## 2015-11-28 DIAGNOSIS — N186 End stage renal disease: Secondary | ICD-10-CM | POA: Diagnosis not present

## 2015-11-28 DIAGNOSIS — E119 Type 2 diabetes mellitus without complications: Secondary | ICD-10-CM | POA: Diagnosis not present

## 2015-11-30 DIAGNOSIS — E119 Type 2 diabetes mellitus without complications: Secondary | ICD-10-CM | POA: Diagnosis not present

## 2015-11-30 DIAGNOSIS — N2581 Secondary hyperparathyroidism of renal origin: Secondary | ICD-10-CM | POA: Diagnosis not present

## 2015-11-30 DIAGNOSIS — D631 Anemia in chronic kidney disease: Secondary | ICD-10-CM | POA: Diagnosis not present

## 2015-11-30 DIAGNOSIS — N186 End stage renal disease: Secondary | ICD-10-CM | POA: Diagnosis not present

## 2015-11-30 DIAGNOSIS — R739 Hyperglycemia, unspecified: Secondary | ICD-10-CM | POA: Diagnosis not present

## 2015-11-30 DIAGNOSIS — D509 Iron deficiency anemia, unspecified: Secondary | ICD-10-CM | POA: Diagnosis not present

## 2015-12-02 DIAGNOSIS — N2581 Secondary hyperparathyroidism of renal origin: Secondary | ICD-10-CM | POA: Diagnosis not present

## 2015-12-02 DIAGNOSIS — D509 Iron deficiency anemia, unspecified: Secondary | ICD-10-CM | POA: Diagnosis not present

## 2015-12-02 DIAGNOSIS — R739 Hyperglycemia, unspecified: Secondary | ICD-10-CM | POA: Diagnosis not present

## 2015-12-02 DIAGNOSIS — D631 Anemia in chronic kidney disease: Secondary | ICD-10-CM | POA: Diagnosis not present

## 2015-12-02 DIAGNOSIS — E119 Type 2 diabetes mellitus without complications: Secondary | ICD-10-CM | POA: Diagnosis not present

## 2015-12-02 DIAGNOSIS — N186 End stage renal disease: Secondary | ICD-10-CM | POA: Diagnosis not present

## 2015-12-05 DIAGNOSIS — R739 Hyperglycemia, unspecified: Secondary | ICD-10-CM | POA: Diagnosis not present

## 2015-12-05 DIAGNOSIS — N186 End stage renal disease: Secondary | ICD-10-CM | POA: Diagnosis not present

## 2015-12-05 DIAGNOSIS — N2581 Secondary hyperparathyroidism of renal origin: Secondary | ICD-10-CM | POA: Diagnosis not present

## 2015-12-05 DIAGNOSIS — D509 Iron deficiency anemia, unspecified: Secondary | ICD-10-CM | POA: Diagnosis not present

## 2015-12-05 DIAGNOSIS — E119 Type 2 diabetes mellitus without complications: Secondary | ICD-10-CM | POA: Diagnosis not present

## 2015-12-05 DIAGNOSIS — D631 Anemia in chronic kidney disease: Secondary | ICD-10-CM | POA: Diagnosis not present

## 2015-12-09 DIAGNOSIS — N2581 Secondary hyperparathyroidism of renal origin: Secondary | ICD-10-CM | POA: Diagnosis not present

## 2015-12-09 DIAGNOSIS — E119 Type 2 diabetes mellitus without complications: Secondary | ICD-10-CM | POA: Diagnosis not present

## 2015-12-09 DIAGNOSIS — R739 Hyperglycemia, unspecified: Secondary | ICD-10-CM | POA: Diagnosis not present

## 2015-12-09 DIAGNOSIS — D509 Iron deficiency anemia, unspecified: Secondary | ICD-10-CM | POA: Diagnosis not present

## 2015-12-09 DIAGNOSIS — D631 Anemia in chronic kidney disease: Secondary | ICD-10-CM | POA: Diagnosis not present

## 2015-12-09 DIAGNOSIS — N186 End stage renal disease: Secondary | ICD-10-CM | POA: Diagnosis not present

## 2015-12-10 ENCOUNTER — Telehealth: Payer: Self-pay

## 2015-12-10 DIAGNOSIS — N2581 Secondary hyperparathyroidism of renal origin: Secondary | ICD-10-CM | POA: Diagnosis not present

## 2015-12-10 DIAGNOSIS — D509 Iron deficiency anemia, unspecified: Secondary | ICD-10-CM | POA: Diagnosis not present

## 2015-12-10 DIAGNOSIS — R739 Hyperglycemia, unspecified: Secondary | ICD-10-CM | POA: Diagnosis not present

## 2015-12-10 DIAGNOSIS — E119 Type 2 diabetes mellitus without complications: Secondary | ICD-10-CM | POA: Diagnosis not present

## 2015-12-10 DIAGNOSIS — D631 Anemia in chronic kidney disease: Secondary | ICD-10-CM | POA: Diagnosis not present

## 2015-12-10 DIAGNOSIS — N186 End stage renal disease: Secondary | ICD-10-CM | POA: Diagnosis not present

## 2015-12-10 NOTE — Telephone Encounter (Signed)
Patient's wife left message on voicemail for Clinical Intake Assistant requesting return call (no additional information given)    Left message on voicemail for patient to return call when available

## 2015-12-10 NOTE — Telephone Encounter (Signed)
LMOM for wife [name] to Return call.

## 2015-12-11 ENCOUNTER — Encounter: Payer: Self-pay | Admitting: Internal Medicine

## 2015-12-11 ENCOUNTER — Ambulatory Visit: Payer: Medicare Other | Admitting: Internal Medicine

## 2015-12-11 ENCOUNTER — Telehealth: Payer: Self-pay

## 2015-12-11 ENCOUNTER — Other Ambulatory Visit: Payer: Self-pay

## 2015-12-11 DIAGNOSIS — G8918 Other acute postprocedural pain: Secondary | ICD-10-CM

## 2015-12-11 DIAGNOSIS — Z992 Dependence on renal dialysis: Secondary | ICD-10-CM | POA: Diagnosis not present

## 2015-12-11 DIAGNOSIS — E1122 Type 2 diabetes mellitus with diabetic chronic kidney disease: Secondary | ICD-10-CM | POA: Diagnosis not present

## 2015-12-11 DIAGNOSIS — N186 End stage renal disease: Secondary | ICD-10-CM | POA: Diagnosis not present

## 2015-12-11 MED ORDER — HYDROCODONE-ACETAMINOPHEN 5-325 MG PO TABS: 1.0000 | ORAL_TABLET | Freq: Four times a day (QID) | ORAL | 0 refills | Status: DC | PRN

## 2015-12-11 NOTE — Telephone Encounter (Signed)
I spoke with patient's wife to let her know that there is a prescription ready to pick up at the office. Prescription is for hydrocodone/apap 5-325 mg tablets, Take 1 tablet by mouth every 6 hours as needed for moderate pain. # 120   Prescription was placed in filing cabinet at front desk.

## 2015-12-12 DIAGNOSIS — N186 End stage renal disease: Secondary | ICD-10-CM | POA: Diagnosis not present

## 2015-12-12 DIAGNOSIS — N2581 Secondary hyperparathyroidism of renal origin: Secondary | ICD-10-CM | POA: Diagnosis not present

## 2015-12-12 DIAGNOSIS — D509 Iron deficiency anemia, unspecified: Secondary | ICD-10-CM | POA: Diagnosis not present

## 2015-12-12 DIAGNOSIS — D631 Anemia in chronic kidney disease: Secondary | ICD-10-CM | POA: Diagnosis not present

## 2015-12-12 DIAGNOSIS — R739 Hyperglycemia, unspecified: Secondary | ICD-10-CM | POA: Diagnosis not present

## 2015-12-12 DIAGNOSIS — E119 Type 2 diabetes mellitus without complications: Secondary | ICD-10-CM | POA: Diagnosis not present

## 2015-12-12 DIAGNOSIS — Z23 Encounter for immunization: Secondary | ICD-10-CM | POA: Diagnosis not present

## 2015-12-12 NOTE — Telephone Encounter (Signed)
I spoke to Mrs. who gave me a different # to call the pt and leave a msg asking pt to confirm this appt. VDM (DD)

## 2015-12-15 DIAGNOSIS — N2581 Secondary hyperparathyroidism of renal origin: Secondary | ICD-10-CM | POA: Diagnosis not present

## 2015-12-15 DIAGNOSIS — D631 Anemia in chronic kidney disease: Secondary | ICD-10-CM | POA: Diagnosis not present

## 2015-12-15 DIAGNOSIS — E119 Type 2 diabetes mellitus without complications: Secondary | ICD-10-CM | POA: Diagnosis not present

## 2015-12-15 DIAGNOSIS — D509 Iron deficiency anemia, unspecified: Secondary | ICD-10-CM | POA: Diagnosis not present

## 2015-12-15 DIAGNOSIS — R739 Hyperglycemia, unspecified: Secondary | ICD-10-CM | POA: Diagnosis not present

## 2015-12-15 DIAGNOSIS — N186 End stage renal disease: Secondary | ICD-10-CM | POA: Diagnosis not present

## 2015-12-17 DIAGNOSIS — N2581 Secondary hyperparathyroidism of renal origin: Secondary | ICD-10-CM | POA: Diagnosis not present

## 2015-12-17 DIAGNOSIS — D631 Anemia in chronic kidney disease: Secondary | ICD-10-CM | POA: Diagnosis not present

## 2015-12-17 DIAGNOSIS — N186 End stage renal disease: Secondary | ICD-10-CM | POA: Diagnosis not present

## 2015-12-17 DIAGNOSIS — R739 Hyperglycemia, unspecified: Secondary | ICD-10-CM | POA: Diagnosis not present

## 2015-12-17 DIAGNOSIS — E119 Type 2 diabetes mellitus without complications: Secondary | ICD-10-CM | POA: Diagnosis not present

## 2015-12-17 DIAGNOSIS — D509 Iron deficiency anemia, unspecified: Secondary | ICD-10-CM | POA: Diagnosis not present

## 2015-12-18 ENCOUNTER — Ambulatory Visit: Payer: Medicare Other

## 2015-12-19 DIAGNOSIS — D509 Iron deficiency anemia, unspecified: Secondary | ICD-10-CM | POA: Diagnosis not present

## 2015-12-19 DIAGNOSIS — R739 Hyperglycemia, unspecified: Secondary | ICD-10-CM | POA: Diagnosis not present

## 2015-12-19 DIAGNOSIS — N2581 Secondary hyperparathyroidism of renal origin: Secondary | ICD-10-CM | POA: Diagnosis not present

## 2015-12-19 DIAGNOSIS — E119 Type 2 diabetes mellitus without complications: Secondary | ICD-10-CM | POA: Diagnosis not present

## 2015-12-19 DIAGNOSIS — D631 Anemia in chronic kidney disease: Secondary | ICD-10-CM | POA: Diagnosis not present

## 2015-12-19 DIAGNOSIS — N186 End stage renal disease: Secondary | ICD-10-CM | POA: Diagnosis not present

## 2015-12-22 DIAGNOSIS — N186 End stage renal disease: Secondary | ICD-10-CM | POA: Diagnosis not present

## 2015-12-22 DIAGNOSIS — D509 Iron deficiency anemia, unspecified: Secondary | ICD-10-CM | POA: Diagnosis not present

## 2015-12-22 DIAGNOSIS — D631 Anemia in chronic kidney disease: Secondary | ICD-10-CM | POA: Diagnosis not present

## 2015-12-22 DIAGNOSIS — E119 Type 2 diabetes mellitus without complications: Secondary | ICD-10-CM | POA: Diagnosis not present

## 2015-12-22 DIAGNOSIS — R739 Hyperglycemia, unspecified: Secondary | ICD-10-CM | POA: Diagnosis not present

## 2015-12-22 DIAGNOSIS — N2581 Secondary hyperparathyroidism of renal origin: Secondary | ICD-10-CM | POA: Diagnosis not present

## 2015-12-24 DIAGNOSIS — R739 Hyperglycemia, unspecified: Secondary | ICD-10-CM | POA: Diagnosis not present

## 2015-12-24 DIAGNOSIS — N186 End stage renal disease: Secondary | ICD-10-CM | POA: Diagnosis not present

## 2015-12-24 DIAGNOSIS — E119 Type 2 diabetes mellitus without complications: Secondary | ICD-10-CM | POA: Diagnosis not present

## 2015-12-24 DIAGNOSIS — D631 Anemia in chronic kidney disease: Secondary | ICD-10-CM | POA: Diagnosis not present

## 2015-12-24 DIAGNOSIS — D509 Iron deficiency anemia, unspecified: Secondary | ICD-10-CM | POA: Diagnosis not present

## 2015-12-24 DIAGNOSIS — N2581 Secondary hyperparathyroidism of renal origin: Secondary | ICD-10-CM | POA: Diagnosis not present

## 2015-12-26 DIAGNOSIS — E119 Type 2 diabetes mellitus without complications: Secondary | ICD-10-CM | POA: Diagnosis not present

## 2015-12-26 DIAGNOSIS — D509 Iron deficiency anemia, unspecified: Secondary | ICD-10-CM | POA: Diagnosis not present

## 2015-12-26 DIAGNOSIS — N186 End stage renal disease: Secondary | ICD-10-CM | POA: Diagnosis not present

## 2015-12-26 DIAGNOSIS — N2581 Secondary hyperparathyroidism of renal origin: Secondary | ICD-10-CM | POA: Diagnosis not present

## 2015-12-26 DIAGNOSIS — R739 Hyperglycemia, unspecified: Secondary | ICD-10-CM | POA: Diagnosis not present

## 2015-12-26 DIAGNOSIS — D631 Anemia in chronic kidney disease: Secondary | ICD-10-CM | POA: Diagnosis not present

## 2015-12-29 DIAGNOSIS — N186 End stage renal disease: Secondary | ICD-10-CM | POA: Diagnosis not present

## 2015-12-29 DIAGNOSIS — E119 Type 2 diabetes mellitus without complications: Secondary | ICD-10-CM | POA: Diagnosis not present

## 2015-12-29 DIAGNOSIS — N2581 Secondary hyperparathyroidism of renal origin: Secondary | ICD-10-CM | POA: Diagnosis not present

## 2015-12-29 DIAGNOSIS — R739 Hyperglycemia, unspecified: Secondary | ICD-10-CM | POA: Diagnosis not present

## 2015-12-29 DIAGNOSIS — D509 Iron deficiency anemia, unspecified: Secondary | ICD-10-CM | POA: Diagnosis not present

## 2015-12-29 DIAGNOSIS — D631 Anemia in chronic kidney disease: Secondary | ICD-10-CM | POA: Diagnosis not present

## 2015-12-31 DIAGNOSIS — E119 Type 2 diabetes mellitus without complications: Secondary | ICD-10-CM | POA: Diagnosis not present

## 2015-12-31 DIAGNOSIS — N186 End stage renal disease: Secondary | ICD-10-CM | POA: Diagnosis not present

## 2015-12-31 DIAGNOSIS — N2581 Secondary hyperparathyroidism of renal origin: Secondary | ICD-10-CM | POA: Diagnosis not present

## 2015-12-31 DIAGNOSIS — R739 Hyperglycemia, unspecified: Secondary | ICD-10-CM | POA: Diagnosis not present

## 2015-12-31 DIAGNOSIS — D631 Anemia in chronic kidney disease: Secondary | ICD-10-CM | POA: Diagnosis not present

## 2015-12-31 DIAGNOSIS — D509 Iron deficiency anemia, unspecified: Secondary | ICD-10-CM | POA: Diagnosis not present

## 2016-01-02 DIAGNOSIS — R739 Hyperglycemia, unspecified: Secondary | ICD-10-CM | POA: Diagnosis not present

## 2016-01-02 DIAGNOSIS — N186 End stage renal disease: Secondary | ICD-10-CM | POA: Diagnosis not present

## 2016-01-02 DIAGNOSIS — N2581 Secondary hyperparathyroidism of renal origin: Secondary | ICD-10-CM | POA: Diagnosis not present

## 2016-01-02 DIAGNOSIS — D631 Anemia in chronic kidney disease: Secondary | ICD-10-CM | POA: Diagnosis not present

## 2016-01-02 DIAGNOSIS — E119 Type 2 diabetes mellitus without complications: Secondary | ICD-10-CM | POA: Diagnosis not present

## 2016-01-02 DIAGNOSIS — D509 Iron deficiency anemia, unspecified: Secondary | ICD-10-CM | POA: Diagnosis not present

## 2016-01-04 DIAGNOSIS — D631 Anemia in chronic kidney disease: Secondary | ICD-10-CM | POA: Diagnosis not present

## 2016-01-04 DIAGNOSIS — E119 Type 2 diabetes mellitus without complications: Secondary | ICD-10-CM | POA: Diagnosis not present

## 2016-01-04 DIAGNOSIS — N186 End stage renal disease: Secondary | ICD-10-CM | POA: Diagnosis not present

## 2016-01-04 DIAGNOSIS — D509 Iron deficiency anemia, unspecified: Secondary | ICD-10-CM | POA: Diagnosis not present

## 2016-01-04 DIAGNOSIS — R739 Hyperglycemia, unspecified: Secondary | ICD-10-CM | POA: Diagnosis not present

## 2016-01-04 DIAGNOSIS — N2581 Secondary hyperparathyroidism of renal origin: Secondary | ICD-10-CM | POA: Diagnosis not present

## 2016-01-07 DIAGNOSIS — R739 Hyperglycemia, unspecified: Secondary | ICD-10-CM | POA: Diagnosis not present

## 2016-01-07 DIAGNOSIS — E119 Type 2 diabetes mellitus without complications: Secondary | ICD-10-CM | POA: Diagnosis not present

## 2016-01-07 DIAGNOSIS — D631 Anemia in chronic kidney disease: Secondary | ICD-10-CM | POA: Diagnosis not present

## 2016-01-07 DIAGNOSIS — N186 End stage renal disease: Secondary | ICD-10-CM | POA: Diagnosis not present

## 2016-01-07 DIAGNOSIS — N2581 Secondary hyperparathyroidism of renal origin: Secondary | ICD-10-CM | POA: Diagnosis not present

## 2016-01-07 DIAGNOSIS — D509 Iron deficiency anemia, unspecified: Secondary | ICD-10-CM | POA: Diagnosis not present

## 2016-01-09 ENCOUNTER — Other Ambulatory Visit: Payer: Self-pay | Admitting: *Deleted

## 2016-01-09 DIAGNOSIS — R739 Hyperglycemia, unspecified: Secondary | ICD-10-CM | POA: Diagnosis not present

## 2016-01-09 DIAGNOSIS — N186 End stage renal disease: Secondary | ICD-10-CM | POA: Diagnosis not present

## 2016-01-09 DIAGNOSIS — N2581 Secondary hyperparathyroidism of renal origin: Secondary | ICD-10-CM | POA: Diagnosis not present

## 2016-01-09 DIAGNOSIS — D631 Anemia in chronic kidney disease: Secondary | ICD-10-CM | POA: Diagnosis not present

## 2016-01-09 DIAGNOSIS — G8918 Other acute postprocedural pain: Secondary | ICD-10-CM

## 2016-01-09 DIAGNOSIS — D509 Iron deficiency anemia, unspecified: Secondary | ICD-10-CM | POA: Diagnosis not present

## 2016-01-09 DIAGNOSIS — E119 Type 2 diabetes mellitus without complications: Secondary | ICD-10-CM | POA: Diagnosis not present

## 2016-01-09 MED ORDER — HYDROCODONE-ACETAMINOPHEN 5-325 MG PO TABS: 1.0000 | ORAL_TABLET | Freq: Four times a day (QID) | ORAL | 0 refills | Status: DC | PRN

## 2016-01-09 NOTE — Telephone Encounter (Signed)
Per Dr. Reed---Needs to keep appointment or no more pain pills.  Wife Notified and agreed.

## 2016-01-09 NOTE — Telephone Encounter (Signed)
Patient requested and will pick up 

## 2016-01-11 DIAGNOSIS — R739 Hyperglycemia, unspecified: Secondary | ICD-10-CM | POA: Diagnosis not present

## 2016-01-11 DIAGNOSIS — E1122 Type 2 diabetes mellitus with diabetic chronic kidney disease: Secondary | ICD-10-CM | POA: Diagnosis not present

## 2016-01-11 DIAGNOSIS — E119 Type 2 diabetes mellitus without complications: Secondary | ICD-10-CM | POA: Diagnosis not present

## 2016-01-11 DIAGNOSIS — N2581 Secondary hyperparathyroidism of renal origin: Secondary | ICD-10-CM | POA: Diagnosis not present

## 2016-01-11 DIAGNOSIS — N186 End stage renal disease: Secondary | ICD-10-CM | POA: Diagnosis not present

## 2016-01-11 DIAGNOSIS — Z992 Dependence on renal dialysis: Secondary | ICD-10-CM | POA: Diagnosis not present

## 2016-01-11 DIAGNOSIS — D631 Anemia in chronic kidney disease: Secondary | ICD-10-CM | POA: Diagnosis not present

## 2016-01-11 DIAGNOSIS — D509 Iron deficiency anemia, unspecified: Secondary | ICD-10-CM | POA: Diagnosis not present

## 2016-01-14 DIAGNOSIS — N2581 Secondary hyperparathyroidism of renal origin: Secondary | ICD-10-CM | POA: Diagnosis not present

## 2016-01-14 DIAGNOSIS — R739 Hyperglycemia, unspecified: Secondary | ICD-10-CM | POA: Diagnosis not present

## 2016-01-14 DIAGNOSIS — N186 End stage renal disease: Secondary | ICD-10-CM | POA: Diagnosis not present

## 2016-01-14 DIAGNOSIS — D631 Anemia in chronic kidney disease: Secondary | ICD-10-CM | POA: Diagnosis not present

## 2016-01-14 DIAGNOSIS — D509 Iron deficiency anemia, unspecified: Secondary | ICD-10-CM | POA: Diagnosis not present

## 2016-01-14 DIAGNOSIS — E119 Type 2 diabetes mellitus without complications: Secondary | ICD-10-CM | POA: Diagnosis not present

## 2016-01-16 DIAGNOSIS — R739 Hyperglycemia, unspecified: Secondary | ICD-10-CM | POA: Diagnosis not present

## 2016-01-16 DIAGNOSIS — E119 Type 2 diabetes mellitus without complications: Secondary | ICD-10-CM | POA: Diagnosis not present

## 2016-01-16 DIAGNOSIS — D631 Anemia in chronic kidney disease: Secondary | ICD-10-CM | POA: Diagnosis not present

## 2016-01-16 DIAGNOSIS — N186 End stage renal disease: Secondary | ICD-10-CM | POA: Diagnosis not present

## 2016-01-16 DIAGNOSIS — D509 Iron deficiency anemia, unspecified: Secondary | ICD-10-CM | POA: Diagnosis not present

## 2016-01-16 DIAGNOSIS — N2581 Secondary hyperparathyroidism of renal origin: Secondary | ICD-10-CM | POA: Diagnosis not present

## 2016-01-19 DIAGNOSIS — N186 End stage renal disease: Secondary | ICD-10-CM | POA: Diagnosis not present

## 2016-01-19 DIAGNOSIS — D509 Iron deficiency anemia, unspecified: Secondary | ICD-10-CM | POA: Diagnosis not present

## 2016-01-19 DIAGNOSIS — D631 Anemia in chronic kidney disease: Secondary | ICD-10-CM | POA: Diagnosis not present

## 2016-01-19 DIAGNOSIS — R739 Hyperglycemia, unspecified: Secondary | ICD-10-CM | POA: Diagnosis not present

## 2016-01-19 DIAGNOSIS — N2581 Secondary hyperparathyroidism of renal origin: Secondary | ICD-10-CM | POA: Diagnosis not present

## 2016-01-19 DIAGNOSIS — E119 Type 2 diabetes mellitus without complications: Secondary | ICD-10-CM | POA: Diagnosis not present

## 2016-01-20 ENCOUNTER — Telehealth: Payer: Self-pay | Admitting: *Deleted

## 2016-01-20 NOTE — Telephone Encounter (Signed)
Patient's wife called to report that patient is having increased pain and swelling in his RIGHT hand. He is afebrile and has no sores or open wounds on his fingertips. She states that this began some months ago but is extremely painful now. He reports no falls or injuries to the hand. Patient is unable to grip objects without pain. He is currently having HD on M,W,F at [facility] using a LEFT arm AVF without any problems. According to his surgical history, he had ligation of a right radio-cephalic AVF by Dr. Kellie Simmering on 11-06-14.  I discussed with Mrs. Menninger that even though we ligated his right hand AVF, he may have blockages in his upper arm /axilla or some other etiology for his symptoms.  I spoke with the kidney center and they are going to evaluate the right hand tomorrow during his dialysis session. They will call me back and give me an update on patient's hand. Mrs. Memmott voiced understanding and agreement with this plan.

## 2016-01-21 DIAGNOSIS — N186 End stage renal disease: Secondary | ICD-10-CM | POA: Diagnosis not present

## 2016-01-21 DIAGNOSIS — R739 Hyperglycemia, unspecified: Secondary | ICD-10-CM | POA: Diagnosis not present

## 2016-01-21 DIAGNOSIS — D631 Anemia in chronic kidney disease: Secondary | ICD-10-CM | POA: Diagnosis not present

## 2016-01-21 DIAGNOSIS — E119 Type 2 diabetes mellitus without complications: Secondary | ICD-10-CM | POA: Diagnosis not present

## 2016-01-21 DIAGNOSIS — N2581 Secondary hyperparathyroidism of renal origin: Secondary | ICD-10-CM | POA: Diagnosis not present

## 2016-01-21 DIAGNOSIS — D509 Iron deficiency anemia, unspecified: Secondary | ICD-10-CM | POA: Diagnosis not present

## 2016-01-22 ENCOUNTER — Other Ambulatory Visit: Payer: Self-pay | Admitting: *Deleted

## 2016-01-22 DIAGNOSIS — T82510D Breakdown (mechanical) of surgically created arteriovenous fistula, subsequent encounter: Secondary | ICD-10-CM

## 2016-01-23 DIAGNOSIS — D631 Anemia in chronic kidney disease: Secondary | ICD-10-CM | POA: Diagnosis not present

## 2016-01-23 DIAGNOSIS — E119 Type 2 diabetes mellitus without complications: Secondary | ICD-10-CM | POA: Diagnosis not present

## 2016-01-23 DIAGNOSIS — D509 Iron deficiency anemia, unspecified: Secondary | ICD-10-CM | POA: Diagnosis not present

## 2016-01-23 DIAGNOSIS — R739 Hyperglycemia, unspecified: Secondary | ICD-10-CM | POA: Diagnosis not present

## 2016-01-23 DIAGNOSIS — N2581 Secondary hyperparathyroidism of renal origin: Secondary | ICD-10-CM | POA: Diagnosis not present

## 2016-01-23 DIAGNOSIS — N186 End stage renal disease: Secondary | ICD-10-CM | POA: Diagnosis not present

## 2016-01-26 DIAGNOSIS — D509 Iron deficiency anemia, unspecified: Secondary | ICD-10-CM | POA: Diagnosis not present

## 2016-01-26 DIAGNOSIS — D631 Anemia in chronic kidney disease: Secondary | ICD-10-CM | POA: Diagnosis not present

## 2016-01-26 DIAGNOSIS — R739 Hyperglycemia, unspecified: Secondary | ICD-10-CM | POA: Diagnosis not present

## 2016-01-26 DIAGNOSIS — N186 End stage renal disease: Secondary | ICD-10-CM | POA: Diagnosis not present

## 2016-01-26 DIAGNOSIS — E119 Type 2 diabetes mellitus without complications: Secondary | ICD-10-CM | POA: Diagnosis not present

## 2016-01-26 DIAGNOSIS — N2581 Secondary hyperparathyroidism of renal origin: Secondary | ICD-10-CM | POA: Diagnosis not present

## 2016-01-28 DIAGNOSIS — N2581 Secondary hyperparathyroidism of renal origin: Secondary | ICD-10-CM | POA: Diagnosis not present

## 2016-01-28 DIAGNOSIS — D509 Iron deficiency anemia, unspecified: Secondary | ICD-10-CM | POA: Diagnosis not present

## 2016-01-28 DIAGNOSIS — R739 Hyperglycemia, unspecified: Secondary | ICD-10-CM | POA: Diagnosis not present

## 2016-01-28 DIAGNOSIS — D631 Anemia in chronic kidney disease: Secondary | ICD-10-CM | POA: Diagnosis not present

## 2016-01-28 DIAGNOSIS — E119 Type 2 diabetes mellitus without complications: Secondary | ICD-10-CM | POA: Diagnosis not present

## 2016-01-28 DIAGNOSIS — N186 End stage renal disease: Secondary | ICD-10-CM | POA: Diagnosis not present

## 2016-01-29 ENCOUNTER — Encounter: Payer: Self-pay | Admitting: Vascular Surgery

## 2016-01-29 ENCOUNTER — Ambulatory Visit: Payer: Medicare Other | Admitting: Internal Medicine

## 2016-01-30 DIAGNOSIS — N186 End stage renal disease: Secondary | ICD-10-CM | POA: Diagnosis not present

## 2016-01-30 DIAGNOSIS — R739 Hyperglycemia, unspecified: Secondary | ICD-10-CM | POA: Diagnosis not present

## 2016-01-30 DIAGNOSIS — D631 Anemia in chronic kidney disease: Secondary | ICD-10-CM | POA: Diagnosis not present

## 2016-01-30 DIAGNOSIS — N2581 Secondary hyperparathyroidism of renal origin: Secondary | ICD-10-CM | POA: Diagnosis not present

## 2016-01-30 DIAGNOSIS — E119 Type 2 diabetes mellitus without complications: Secondary | ICD-10-CM | POA: Diagnosis not present

## 2016-01-30 DIAGNOSIS — D509 Iron deficiency anemia, unspecified: Secondary | ICD-10-CM | POA: Diagnosis not present

## 2016-02-02 DIAGNOSIS — D631 Anemia in chronic kidney disease: Secondary | ICD-10-CM | POA: Diagnosis not present

## 2016-02-02 DIAGNOSIS — N2581 Secondary hyperparathyroidism of renal origin: Secondary | ICD-10-CM | POA: Diagnosis not present

## 2016-02-02 DIAGNOSIS — N186 End stage renal disease: Secondary | ICD-10-CM | POA: Diagnosis not present

## 2016-02-02 DIAGNOSIS — D509 Iron deficiency anemia, unspecified: Secondary | ICD-10-CM | POA: Diagnosis not present

## 2016-02-02 DIAGNOSIS — E119 Type 2 diabetes mellitus without complications: Secondary | ICD-10-CM | POA: Diagnosis not present

## 2016-02-02 DIAGNOSIS — R739 Hyperglycemia, unspecified: Secondary | ICD-10-CM | POA: Diagnosis not present

## 2016-02-04 ENCOUNTER — Other Ambulatory Visit: Payer: Self-pay | Admitting: *Deleted

## 2016-02-04 DIAGNOSIS — R739 Hyperglycemia, unspecified: Secondary | ICD-10-CM | POA: Diagnosis not present

## 2016-02-04 DIAGNOSIS — D509 Iron deficiency anemia, unspecified: Secondary | ICD-10-CM | POA: Diagnosis not present

## 2016-02-04 DIAGNOSIS — D631 Anemia in chronic kidney disease: Secondary | ICD-10-CM | POA: Diagnosis not present

## 2016-02-04 DIAGNOSIS — E119 Type 2 diabetes mellitus without complications: Secondary | ICD-10-CM | POA: Diagnosis not present

## 2016-02-04 DIAGNOSIS — N2581 Secondary hyperparathyroidism of renal origin: Secondary | ICD-10-CM | POA: Diagnosis not present

## 2016-02-04 DIAGNOSIS — N186 End stage renal disease: Secondary | ICD-10-CM | POA: Diagnosis not present

## 2016-02-04 DIAGNOSIS — M79601 Pain in right arm: Secondary | ICD-10-CM

## 2016-02-05 ENCOUNTER — Inpatient Hospital Stay (HOSPITAL_COMMUNITY)
Admission: RE | Admit: 2016-02-05 | Discharge: 2016-02-05 | Disposition: A | Discharge status: Home / Self Care | Payer: Medicare Other | Source: Ambulatory Visit | Attending: Vascular Surgery | Admitting: Vascular Surgery

## 2016-02-05 ENCOUNTER — Ambulatory Visit: Payer: Medicare Other | Admitting: Vascular Surgery

## 2016-02-05 ENCOUNTER — Telehealth: Payer: Self-pay | Admitting: Internal Medicine

## 2016-02-05 DIAGNOSIS — T82510D Breakdown (mechanical) of surgically created arteriovenous fistula, subsequent encounter: Secondary | ICD-10-CM

## 2016-02-05 NOTE — Telephone Encounter (Signed)
left msg asking pt to call and schedule AWV along with follow up OV that was missed due to weather. VDM (DD)

## 2016-02-06 DIAGNOSIS — N186 End stage renal disease: Secondary | ICD-10-CM | POA: Diagnosis not present

## 2016-02-06 DIAGNOSIS — N2581 Secondary hyperparathyroidism of renal origin: Secondary | ICD-10-CM | POA: Diagnosis not present

## 2016-02-06 DIAGNOSIS — R739 Hyperglycemia, unspecified: Secondary | ICD-10-CM | POA: Diagnosis not present

## 2016-02-06 DIAGNOSIS — D631 Anemia in chronic kidney disease: Secondary | ICD-10-CM | POA: Diagnosis not present

## 2016-02-06 DIAGNOSIS — E119 Type 2 diabetes mellitus without complications: Secondary | ICD-10-CM | POA: Diagnosis not present

## 2016-02-06 DIAGNOSIS — D509 Iron deficiency anemia, unspecified: Secondary | ICD-10-CM | POA: Diagnosis not present

## 2016-02-09 DIAGNOSIS — D509 Iron deficiency anemia, unspecified: Secondary | ICD-10-CM | POA: Diagnosis not present

## 2016-02-09 DIAGNOSIS — D631 Anemia in chronic kidney disease: Secondary | ICD-10-CM | POA: Diagnosis not present

## 2016-02-09 DIAGNOSIS — N2581 Secondary hyperparathyroidism of renal origin: Secondary | ICD-10-CM | POA: Diagnosis not present

## 2016-02-09 DIAGNOSIS — N186 End stage renal disease: Secondary | ICD-10-CM | POA: Diagnosis not present

## 2016-02-09 DIAGNOSIS — E119 Type 2 diabetes mellitus without complications: Secondary | ICD-10-CM | POA: Diagnosis not present

## 2016-02-09 DIAGNOSIS — R739 Hyperglycemia, unspecified: Secondary | ICD-10-CM | POA: Diagnosis not present

## 2016-02-11 DIAGNOSIS — N186 End stage renal disease: Secondary | ICD-10-CM | POA: Diagnosis not present

## 2016-02-11 DIAGNOSIS — E1122 Type 2 diabetes mellitus with diabetic chronic kidney disease: Secondary | ICD-10-CM | POA: Diagnosis not present

## 2016-02-11 DIAGNOSIS — Z992 Dependence on renal dialysis: Secondary | ICD-10-CM | POA: Diagnosis not present

## 2016-02-12 DIAGNOSIS — R739 Hyperglycemia, unspecified: Secondary | ICD-10-CM | POA: Diagnosis not present

## 2016-02-12 DIAGNOSIS — N186 End stage renal disease: Secondary | ICD-10-CM | POA: Diagnosis not present

## 2016-02-12 DIAGNOSIS — E119 Type 2 diabetes mellitus without complications: Secondary | ICD-10-CM | POA: Diagnosis not present

## 2016-02-12 DIAGNOSIS — D631 Anemia in chronic kidney disease: Secondary | ICD-10-CM | POA: Diagnosis not present

## 2016-02-12 DIAGNOSIS — N2581 Secondary hyperparathyroidism of renal origin: Secondary | ICD-10-CM | POA: Diagnosis not present

## 2016-02-12 DIAGNOSIS — D509 Iron deficiency anemia, unspecified: Secondary | ICD-10-CM | POA: Diagnosis not present

## 2016-02-16 DIAGNOSIS — D509 Iron deficiency anemia, unspecified: Secondary | ICD-10-CM | POA: Diagnosis not present

## 2016-02-16 DIAGNOSIS — D631 Anemia in chronic kidney disease: Secondary | ICD-10-CM | POA: Diagnosis not present

## 2016-02-16 DIAGNOSIS — E119 Type 2 diabetes mellitus without complications: Secondary | ICD-10-CM | POA: Diagnosis not present

## 2016-02-16 DIAGNOSIS — N186 End stage renal disease: Secondary | ICD-10-CM | POA: Diagnosis not present

## 2016-02-16 DIAGNOSIS — R739 Hyperglycemia, unspecified: Secondary | ICD-10-CM | POA: Diagnosis not present

## 2016-02-16 DIAGNOSIS — N2581 Secondary hyperparathyroidism of renal origin: Secondary | ICD-10-CM | POA: Diagnosis not present

## 2016-02-18 DIAGNOSIS — R739 Hyperglycemia, unspecified: Secondary | ICD-10-CM | POA: Diagnosis not present

## 2016-02-18 DIAGNOSIS — N186 End stage renal disease: Secondary | ICD-10-CM | POA: Diagnosis not present

## 2016-02-18 DIAGNOSIS — D631 Anemia in chronic kidney disease: Secondary | ICD-10-CM | POA: Diagnosis not present

## 2016-02-18 DIAGNOSIS — E119 Type 2 diabetes mellitus without complications: Secondary | ICD-10-CM | POA: Diagnosis not present

## 2016-02-18 DIAGNOSIS — D509 Iron deficiency anemia, unspecified: Secondary | ICD-10-CM | POA: Diagnosis not present

## 2016-02-18 DIAGNOSIS — N2581 Secondary hyperparathyroidism of renal origin: Secondary | ICD-10-CM | POA: Diagnosis not present

## 2016-02-20 DIAGNOSIS — N2581 Secondary hyperparathyroidism of renal origin: Secondary | ICD-10-CM | POA: Diagnosis not present

## 2016-02-20 DIAGNOSIS — R739 Hyperglycemia, unspecified: Secondary | ICD-10-CM | POA: Diagnosis not present

## 2016-02-20 DIAGNOSIS — N186 End stage renal disease: Secondary | ICD-10-CM | POA: Diagnosis not present

## 2016-02-20 DIAGNOSIS — E119 Type 2 diabetes mellitus without complications: Secondary | ICD-10-CM | POA: Diagnosis not present

## 2016-02-20 DIAGNOSIS — D509 Iron deficiency anemia, unspecified: Secondary | ICD-10-CM | POA: Diagnosis not present

## 2016-02-20 DIAGNOSIS — D631 Anemia in chronic kidney disease: Secondary | ICD-10-CM | POA: Diagnosis not present

## 2016-02-23 ENCOUNTER — Other Ambulatory Visit: Payer: Self-pay | Admitting: Internal Medicine

## 2016-02-23 DIAGNOSIS — N186 End stage renal disease: Secondary | ICD-10-CM | POA: Diagnosis not present

## 2016-02-23 DIAGNOSIS — N2581 Secondary hyperparathyroidism of renal origin: Secondary | ICD-10-CM | POA: Diagnosis not present

## 2016-02-23 DIAGNOSIS — E119 Type 2 diabetes mellitus without complications: Secondary | ICD-10-CM | POA: Diagnosis not present

## 2016-02-23 DIAGNOSIS — D631 Anemia in chronic kidney disease: Secondary | ICD-10-CM | POA: Diagnosis not present

## 2016-02-23 DIAGNOSIS — D509 Iron deficiency anemia, unspecified: Secondary | ICD-10-CM | POA: Diagnosis not present

## 2016-02-23 DIAGNOSIS — R739 Hyperglycemia, unspecified: Secondary | ICD-10-CM | POA: Diagnosis not present

## 2016-02-25 DIAGNOSIS — N186 End stage renal disease: Secondary | ICD-10-CM | POA: Diagnosis not present

## 2016-02-25 DIAGNOSIS — E119 Type 2 diabetes mellitus without complications: Secondary | ICD-10-CM | POA: Diagnosis not present

## 2016-02-25 DIAGNOSIS — R739 Hyperglycemia, unspecified: Secondary | ICD-10-CM | POA: Diagnosis not present

## 2016-02-25 DIAGNOSIS — N2581 Secondary hyperparathyroidism of renal origin: Secondary | ICD-10-CM | POA: Diagnosis not present

## 2016-02-25 DIAGNOSIS — D509 Iron deficiency anemia, unspecified: Secondary | ICD-10-CM | POA: Diagnosis not present

## 2016-02-25 DIAGNOSIS — D631 Anemia in chronic kidney disease: Secondary | ICD-10-CM | POA: Diagnosis not present

## 2016-02-27 DIAGNOSIS — N2581 Secondary hyperparathyroidism of renal origin: Secondary | ICD-10-CM | POA: Diagnosis not present

## 2016-02-27 DIAGNOSIS — R739 Hyperglycemia, unspecified: Secondary | ICD-10-CM | POA: Diagnosis not present

## 2016-02-27 DIAGNOSIS — N186 End stage renal disease: Secondary | ICD-10-CM | POA: Diagnosis not present

## 2016-02-27 DIAGNOSIS — D631 Anemia in chronic kidney disease: Secondary | ICD-10-CM | POA: Diagnosis not present

## 2016-02-27 DIAGNOSIS — E119 Type 2 diabetes mellitus without complications: Secondary | ICD-10-CM | POA: Diagnosis not present

## 2016-02-27 DIAGNOSIS — D509 Iron deficiency anemia, unspecified: Secondary | ICD-10-CM | POA: Diagnosis not present

## 2016-03-01 DIAGNOSIS — D631 Anemia in chronic kidney disease: Secondary | ICD-10-CM | POA: Diagnosis not present

## 2016-03-01 DIAGNOSIS — N2581 Secondary hyperparathyroidism of renal origin: Secondary | ICD-10-CM | POA: Diagnosis not present

## 2016-03-01 DIAGNOSIS — E119 Type 2 diabetes mellitus without complications: Secondary | ICD-10-CM | POA: Diagnosis not present

## 2016-03-01 DIAGNOSIS — R739 Hyperglycemia, unspecified: Secondary | ICD-10-CM | POA: Diagnosis not present

## 2016-03-01 DIAGNOSIS — N186 End stage renal disease: Secondary | ICD-10-CM | POA: Diagnosis not present

## 2016-03-01 DIAGNOSIS — D509 Iron deficiency anemia, unspecified: Secondary | ICD-10-CM | POA: Diagnosis not present

## 2016-03-03 DIAGNOSIS — E119 Type 2 diabetes mellitus without complications: Secondary | ICD-10-CM | POA: Diagnosis not present

## 2016-03-03 DIAGNOSIS — R739 Hyperglycemia, unspecified: Secondary | ICD-10-CM | POA: Diagnosis not present

## 2016-03-03 DIAGNOSIS — D631 Anemia in chronic kidney disease: Secondary | ICD-10-CM | POA: Diagnosis not present

## 2016-03-03 DIAGNOSIS — N186 End stage renal disease: Secondary | ICD-10-CM | POA: Diagnosis not present

## 2016-03-03 DIAGNOSIS — N2581 Secondary hyperparathyroidism of renal origin: Secondary | ICD-10-CM | POA: Diagnosis not present

## 2016-03-03 DIAGNOSIS — D509 Iron deficiency anemia, unspecified: Secondary | ICD-10-CM | POA: Diagnosis not present

## 2016-03-05 DIAGNOSIS — D509 Iron deficiency anemia, unspecified: Secondary | ICD-10-CM | POA: Diagnosis not present

## 2016-03-05 DIAGNOSIS — E119 Type 2 diabetes mellitus without complications: Secondary | ICD-10-CM | POA: Diagnosis not present

## 2016-03-05 DIAGNOSIS — N2581 Secondary hyperparathyroidism of renal origin: Secondary | ICD-10-CM | POA: Diagnosis not present

## 2016-03-05 DIAGNOSIS — D631 Anemia in chronic kidney disease: Secondary | ICD-10-CM | POA: Diagnosis not present

## 2016-03-05 DIAGNOSIS — N186 End stage renal disease: Secondary | ICD-10-CM | POA: Diagnosis not present

## 2016-03-05 DIAGNOSIS — R739 Hyperglycemia, unspecified: Secondary | ICD-10-CM | POA: Diagnosis not present

## 2016-03-08 DIAGNOSIS — R739 Hyperglycemia, unspecified: Secondary | ICD-10-CM | POA: Diagnosis not present

## 2016-03-08 DIAGNOSIS — E119 Type 2 diabetes mellitus without complications: Secondary | ICD-10-CM | POA: Diagnosis not present

## 2016-03-08 DIAGNOSIS — N186 End stage renal disease: Secondary | ICD-10-CM | POA: Diagnosis not present

## 2016-03-08 DIAGNOSIS — D631 Anemia in chronic kidney disease: Secondary | ICD-10-CM | POA: Diagnosis not present

## 2016-03-08 DIAGNOSIS — N2581 Secondary hyperparathyroidism of renal origin: Secondary | ICD-10-CM | POA: Diagnosis not present

## 2016-03-08 DIAGNOSIS — D509 Iron deficiency anemia, unspecified: Secondary | ICD-10-CM | POA: Diagnosis not present

## 2016-03-10 DIAGNOSIS — R739 Hyperglycemia, unspecified: Secondary | ICD-10-CM | POA: Diagnosis not present

## 2016-03-10 DIAGNOSIS — D509 Iron deficiency anemia, unspecified: Secondary | ICD-10-CM | POA: Diagnosis not present

## 2016-03-10 DIAGNOSIS — E119 Type 2 diabetes mellitus without complications: Secondary | ICD-10-CM | POA: Diagnosis not present

## 2016-03-10 DIAGNOSIS — E1122 Type 2 diabetes mellitus with diabetic chronic kidney disease: Secondary | ICD-10-CM | POA: Diagnosis not present

## 2016-03-10 DIAGNOSIS — N2581 Secondary hyperparathyroidism of renal origin: Secondary | ICD-10-CM | POA: Diagnosis not present

## 2016-03-10 DIAGNOSIS — N186 End stage renal disease: Secondary | ICD-10-CM | POA: Diagnosis not present

## 2016-03-10 DIAGNOSIS — Z992 Dependence on renal dialysis: Secondary | ICD-10-CM | POA: Diagnosis not present

## 2016-03-10 DIAGNOSIS — D631 Anemia in chronic kidney disease: Secondary | ICD-10-CM | POA: Diagnosis not present

## 2016-03-12 ENCOUNTER — Encounter: Payer: Self-pay | Admitting: Vascular Surgery

## 2016-03-12 DIAGNOSIS — E161 Other hypoglycemia: Secondary | ICD-10-CM | POA: Diagnosis not present

## 2016-03-12 DIAGNOSIS — E119 Type 2 diabetes mellitus without complications: Secondary | ICD-10-CM | POA: Diagnosis not present

## 2016-03-12 DIAGNOSIS — N2581 Secondary hyperparathyroidism of renal origin: Secondary | ICD-10-CM | POA: Diagnosis not present

## 2016-03-12 DIAGNOSIS — D509 Iron deficiency anemia, unspecified: Secondary | ICD-10-CM | POA: Diagnosis not present

## 2016-03-12 DIAGNOSIS — N186 End stage renal disease: Secondary | ICD-10-CM | POA: Diagnosis not present

## 2016-03-12 DIAGNOSIS — D631 Anemia in chronic kidney disease: Secondary | ICD-10-CM | POA: Diagnosis not present

## 2016-03-12 DIAGNOSIS — R739 Hyperglycemia, unspecified: Secondary | ICD-10-CM | POA: Diagnosis not present

## 2016-03-15 DIAGNOSIS — E161 Other hypoglycemia: Secondary | ICD-10-CM | POA: Diagnosis not present

## 2016-03-15 DIAGNOSIS — E119 Type 2 diabetes mellitus without complications: Secondary | ICD-10-CM | POA: Diagnosis not present

## 2016-03-15 DIAGNOSIS — N186 End stage renal disease: Secondary | ICD-10-CM | POA: Diagnosis not present

## 2016-03-15 DIAGNOSIS — D509 Iron deficiency anemia, unspecified: Secondary | ICD-10-CM | POA: Diagnosis not present

## 2016-03-15 DIAGNOSIS — N2581 Secondary hyperparathyroidism of renal origin: Secondary | ICD-10-CM | POA: Diagnosis not present

## 2016-03-15 DIAGNOSIS — R739 Hyperglycemia, unspecified: Secondary | ICD-10-CM | POA: Diagnosis not present

## 2016-03-17 DIAGNOSIS — R739 Hyperglycemia, unspecified: Secondary | ICD-10-CM | POA: Diagnosis not present

## 2016-03-17 DIAGNOSIS — E161 Other hypoglycemia: Secondary | ICD-10-CM | POA: Diagnosis not present

## 2016-03-17 DIAGNOSIS — N2581 Secondary hyperparathyroidism of renal origin: Secondary | ICD-10-CM | POA: Diagnosis not present

## 2016-03-17 DIAGNOSIS — N186 End stage renal disease: Secondary | ICD-10-CM | POA: Diagnosis not present

## 2016-03-17 DIAGNOSIS — D509 Iron deficiency anemia, unspecified: Secondary | ICD-10-CM | POA: Diagnosis not present

## 2016-03-17 DIAGNOSIS — E119 Type 2 diabetes mellitus without complications: Secondary | ICD-10-CM | POA: Diagnosis not present

## 2016-03-19 DIAGNOSIS — N186 End stage renal disease: Secondary | ICD-10-CM | POA: Diagnosis not present

## 2016-03-19 DIAGNOSIS — N2581 Secondary hyperparathyroidism of renal origin: Secondary | ICD-10-CM | POA: Diagnosis not present

## 2016-03-19 DIAGNOSIS — R739 Hyperglycemia, unspecified: Secondary | ICD-10-CM | POA: Diagnosis not present

## 2016-03-19 DIAGNOSIS — E161 Other hypoglycemia: Secondary | ICD-10-CM | POA: Diagnosis not present

## 2016-03-19 DIAGNOSIS — D509 Iron deficiency anemia, unspecified: Secondary | ICD-10-CM | POA: Diagnosis not present

## 2016-03-19 DIAGNOSIS — E119 Type 2 diabetes mellitus without complications: Secondary | ICD-10-CM | POA: Diagnosis not present

## 2016-03-22 DIAGNOSIS — E119 Type 2 diabetes mellitus without complications: Secondary | ICD-10-CM | POA: Diagnosis not present

## 2016-03-22 DIAGNOSIS — N186 End stage renal disease: Secondary | ICD-10-CM | POA: Diagnosis not present

## 2016-03-22 DIAGNOSIS — D509 Iron deficiency anemia, unspecified: Secondary | ICD-10-CM | POA: Diagnosis not present

## 2016-03-22 DIAGNOSIS — R739 Hyperglycemia, unspecified: Secondary | ICD-10-CM | POA: Diagnosis not present

## 2016-03-22 DIAGNOSIS — N2581 Secondary hyperparathyroidism of renal origin: Secondary | ICD-10-CM | POA: Diagnosis not present

## 2016-03-22 DIAGNOSIS — E161 Other hypoglycemia: Secondary | ICD-10-CM | POA: Diagnosis not present

## 2016-03-24 DIAGNOSIS — N186 End stage renal disease: Secondary | ICD-10-CM | POA: Diagnosis not present

## 2016-03-24 DIAGNOSIS — D509 Iron deficiency anemia, unspecified: Secondary | ICD-10-CM | POA: Diagnosis not present

## 2016-03-24 DIAGNOSIS — E161 Other hypoglycemia: Secondary | ICD-10-CM | POA: Diagnosis not present

## 2016-03-24 DIAGNOSIS — N2581 Secondary hyperparathyroidism of renal origin: Secondary | ICD-10-CM | POA: Diagnosis not present

## 2016-03-24 DIAGNOSIS — R739 Hyperglycemia, unspecified: Secondary | ICD-10-CM | POA: Diagnosis not present

## 2016-03-24 DIAGNOSIS — E119 Type 2 diabetes mellitus without complications: Secondary | ICD-10-CM | POA: Diagnosis not present

## 2016-03-25 ENCOUNTER — Ambulatory Visit: Payer: Medicare Other | Admitting: Vascular Surgery

## 2016-03-25 ENCOUNTER — Other Ambulatory Visit (HOSPITAL_COMMUNITY): Payer: Medicare Other

## 2016-03-26 DIAGNOSIS — R739 Hyperglycemia, unspecified: Secondary | ICD-10-CM | POA: Diagnosis not present

## 2016-03-26 DIAGNOSIS — N2581 Secondary hyperparathyroidism of renal origin: Secondary | ICD-10-CM | POA: Diagnosis not present

## 2016-03-26 DIAGNOSIS — D509 Iron deficiency anemia, unspecified: Secondary | ICD-10-CM | POA: Diagnosis not present

## 2016-03-26 DIAGNOSIS — E161 Other hypoglycemia: Secondary | ICD-10-CM | POA: Diagnosis not present

## 2016-03-26 DIAGNOSIS — N186 End stage renal disease: Secondary | ICD-10-CM | POA: Diagnosis not present

## 2016-03-26 DIAGNOSIS — E119 Type 2 diabetes mellitus without complications: Secondary | ICD-10-CM | POA: Diagnosis not present

## 2016-03-29 DIAGNOSIS — E161 Other hypoglycemia: Secondary | ICD-10-CM | POA: Diagnosis not present

## 2016-03-29 DIAGNOSIS — D509 Iron deficiency anemia, unspecified: Secondary | ICD-10-CM | POA: Diagnosis not present

## 2016-03-29 DIAGNOSIS — N186 End stage renal disease: Secondary | ICD-10-CM | POA: Diagnosis not present

## 2016-03-29 DIAGNOSIS — E119 Type 2 diabetes mellitus without complications: Secondary | ICD-10-CM | POA: Diagnosis not present

## 2016-03-29 DIAGNOSIS — N2581 Secondary hyperparathyroidism of renal origin: Secondary | ICD-10-CM | POA: Diagnosis not present

## 2016-03-29 DIAGNOSIS — R739 Hyperglycemia, unspecified: Secondary | ICD-10-CM | POA: Diagnosis not present

## 2016-03-31 DIAGNOSIS — D509 Iron deficiency anemia, unspecified: Secondary | ICD-10-CM | POA: Diagnosis not present

## 2016-03-31 DIAGNOSIS — R739 Hyperglycemia, unspecified: Secondary | ICD-10-CM | POA: Diagnosis not present

## 2016-03-31 DIAGNOSIS — E119 Type 2 diabetes mellitus without complications: Secondary | ICD-10-CM | POA: Diagnosis not present

## 2016-03-31 DIAGNOSIS — E161 Other hypoglycemia: Secondary | ICD-10-CM | POA: Diagnosis not present

## 2016-03-31 DIAGNOSIS — N2581 Secondary hyperparathyroidism of renal origin: Secondary | ICD-10-CM | POA: Diagnosis not present

## 2016-03-31 DIAGNOSIS — N186 End stage renal disease: Secondary | ICD-10-CM | POA: Diagnosis not present

## 2016-04-01 ENCOUNTER — Ambulatory Visit: Payer: Medicare Other | Admitting: Internal Medicine

## 2016-04-02 DIAGNOSIS — N2581 Secondary hyperparathyroidism of renal origin: Secondary | ICD-10-CM | POA: Diagnosis not present

## 2016-04-02 DIAGNOSIS — E119 Type 2 diabetes mellitus without complications: Secondary | ICD-10-CM | POA: Diagnosis not present

## 2016-04-02 DIAGNOSIS — R739 Hyperglycemia, unspecified: Secondary | ICD-10-CM | POA: Diagnosis not present

## 2016-04-02 DIAGNOSIS — D509 Iron deficiency anemia, unspecified: Secondary | ICD-10-CM | POA: Diagnosis not present

## 2016-04-02 DIAGNOSIS — N186 End stage renal disease: Secondary | ICD-10-CM | POA: Diagnosis not present

## 2016-04-02 DIAGNOSIS — E161 Other hypoglycemia: Secondary | ICD-10-CM | POA: Diagnosis not present

## 2016-04-03 ENCOUNTER — Other Ambulatory Visit: Payer: Self-pay | Admitting: Internal Medicine

## 2016-04-05 ENCOUNTER — Other Ambulatory Visit: Payer: Self-pay | Admitting: *Deleted

## 2016-04-05 DIAGNOSIS — G546 Phantom limb syndrome with pain: Secondary | ICD-10-CM

## 2016-04-05 DIAGNOSIS — E119 Type 2 diabetes mellitus without complications: Secondary | ICD-10-CM | POA: Diagnosis not present

## 2016-04-05 DIAGNOSIS — N186 End stage renal disease: Secondary | ICD-10-CM | POA: Diagnosis not present

## 2016-04-05 DIAGNOSIS — R739 Hyperglycemia, unspecified: Secondary | ICD-10-CM | POA: Diagnosis not present

## 2016-04-05 DIAGNOSIS — D509 Iron deficiency anemia, unspecified: Secondary | ICD-10-CM | POA: Diagnosis not present

## 2016-04-05 DIAGNOSIS — E161 Other hypoglycemia: Secondary | ICD-10-CM | POA: Diagnosis not present

## 2016-04-05 DIAGNOSIS — N2581 Secondary hyperparathyroidism of renal origin: Secondary | ICD-10-CM | POA: Diagnosis not present

## 2016-04-05 MED ORDER — AMLODIPINE BESYLATE 10 MG PO TABS
ORAL_TABLET | ORAL | 0 refills | Status: DC
Start: 2016-04-05 — End: 2016-04-13

## 2016-04-05 MED ORDER — INSULIN ASPART 100 UNIT/ML FLEXPEN: 5.0000 [IU] | PEN_INJECTOR | Freq: Three times a day (TID) | SUBCUTANEOUS | 0 refills | Status: DC

## 2016-04-05 MED ORDER — PREGABALIN 75 MG PO CAPS: ORAL_CAPSULE | ORAL | 0 refills | Status: DC

## 2016-04-05 MED ORDER — CLOPIDOGREL BISULFATE 75 MG PO TABS: ORAL_TABLET | ORAL | 0 refills | Status: DC

## 2016-04-05 MED ORDER — INSULIN DETEMIR 100 UNIT/ML FLEXPEN: PEN_INJECTOR | SUBCUTANEOUS | 0 refills | Status: DC

## 2016-04-05 MED ORDER — POLYETHYLENE GLYCOL 3350 17 G PO PACK: 17.0000 g | PACK | Freq: Every day | ORAL | 0 refills | Status: AC

## 2016-04-05 MED ORDER — QUETIAPINE FUMARATE 25 MG PO TABS: 25.0000 mg | ORAL_TABLET | Freq: Every day | ORAL | 0 refills | Status: AC

## 2016-04-05 MED ORDER — TAMSULOSIN HCL 0.4 MG PO CAPS: ORAL_CAPSULE | ORAL | 0 refills | Status: DC

## 2016-04-05 MED ORDER — PRAVASTATIN SODIUM 40 MG PO TABS: ORAL_TABLET | ORAL | 0 refills | Status: DC

## 2016-04-05 NOTE — Telephone Encounter (Signed)
Patient is being discharged from practice. Per Caren Griffins call in 30 day supply of medications to pharmacy. Faxed.

## 2016-04-05 NOTE — Telephone Encounter (Signed)
Please advise on refill request, patient with multiple no show appointments  Last OV 07/31/2015 (8 months ago)

## 2016-04-07 DIAGNOSIS — N186 End stage renal disease: Secondary | ICD-10-CM | POA: Diagnosis not present

## 2016-04-07 DIAGNOSIS — R739 Hyperglycemia, unspecified: Secondary | ICD-10-CM | POA: Diagnosis not present

## 2016-04-07 DIAGNOSIS — N2581 Secondary hyperparathyroidism of renal origin: Secondary | ICD-10-CM | POA: Diagnosis not present

## 2016-04-07 DIAGNOSIS — E161 Other hypoglycemia: Secondary | ICD-10-CM | POA: Diagnosis not present

## 2016-04-07 DIAGNOSIS — D509 Iron deficiency anemia, unspecified: Secondary | ICD-10-CM | POA: Diagnosis not present

## 2016-04-07 DIAGNOSIS — E119 Type 2 diabetes mellitus without complications: Secondary | ICD-10-CM | POA: Diagnosis not present

## 2016-04-09 ENCOUNTER — Emergency Department (HOSPITAL_COMMUNITY): Payer: Medicare Other

## 2016-04-09 ENCOUNTER — Encounter (HOSPITAL_COMMUNITY): Payer: Self-pay | Admitting: Neurology

## 2016-04-09 ENCOUNTER — Inpatient Hospital Stay (HOSPITAL_COMMUNITY)
Admission: EM | Admit: 2016-04-09 | Discharge: 2016-04-13 | DRG: 280 | Disposition: A | Discharge status: Home / Self Care | Payer: Medicare Other | Attending: Internal Medicine | Admitting: Internal Medicine

## 2016-04-09 DIAGNOSIS — J9601 Acute respiratory failure with hypoxia: Secondary | ICD-10-CM | POA: Diagnosis present

## 2016-04-09 DIAGNOSIS — I214 Non-ST elevation (NSTEMI) myocardial infarction: Secondary | ICD-10-CM | POA: Diagnosis present

## 2016-04-09 DIAGNOSIS — Z87891 Personal history of nicotine dependence: Secondary | ICD-10-CM

## 2016-04-09 DIAGNOSIS — I10 Essential (primary) hypertension: Secondary | ICD-10-CM | POA: Diagnosis not present

## 2016-04-09 DIAGNOSIS — R7989 Other specified abnormal findings of blood chemistry: Secondary | ICD-10-CM

## 2016-04-09 DIAGNOSIS — E8770 Fluid overload, unspecified: Secondary | ICD-10-CM | POA: Diagnosis not present

## 2016-04-09 DIAGNOSIS — I5042 Chronic combined systolic (congestive) and diastolic (congestive) heart failure: Secondary | ICD-10-CM

## 2016-04-09 DIAGNOSIS — G8929 Other chronic pain: Secondary | ICD-10-CM | POA: Diagnosis present

## 2016-04-09 DIAGNOSIS — J811 Chronic pulmonary edema: Secondary | ICD-10-CM

## 2016-04-09 DIAGNOSIS — I2510 Atherosclerotic heart disease of native coronary artery without angina pectoris: Secondary | ICD-10-CM | POA: Diagnosis present

## 2016-04-09 DIAGNOSIS — Z820 Family history of epilepsy and other diseases of the nervous system: Secondary | ICD-10-CM

## 2016-04-09 DIAGNOSIS — E1165 Type 2 diabetes mellitus with hyperglycemia: Secondary | ICD-10-CM | POA: Diagnosis not present

## 2016-04-09 DIAGNOSIS — E785 Hyperlipidemia, unspecified: Secondary | ICD-10-CM | POA: Diagnosis present

## 2016-04-09 DIAGNOSIS — R0602 Shortness of breath: Secondary | ICD-10-CM | POA: Diagnosis not present

## 2016-04-09 DIAGNOSIS — Z992 Dependence on renal dialysis: Secondary | ICD-10-CM

## 2016-04-09 DIAGNOSIS — I509 Heart failure, unspecified: Secondary | ICD-10-CM | POA: Diagnosis not present

## 2016-04-09 DIAGNOSIS — R778 Other specified abnormalities of plasma proteins: Secondary | ICD-10-CM | POA: Diagnosis present

## 2016-04-09 DIAGNOSIS — I5043 Acute on chronic combined systolic (congestive) and diastolic (congestive) heart failure: Secondary | ICD-10-CM | POA: Diagnosis not present

## 2016-04-09 DIAGNOSIS — F319 Bipolar disorder, unspecified: Secondary | ICD-10-CM | POA: Diagnosis present

## 2016-04-09 DIAGNOSIS — I447 Left bundle-branch block, unspecified: Secondary | ICD-10-CM | POA: Diagnosis present

## 2016-04-09 DIAGNOSIS — Z7902 Long term (current) use of antithrombotics/antiplatelets: Secondary | ICD-10-CM

## 2016-04-09 DIAGNOSIS — Z841 Family history of disorders of kidney and ureter: Secondary | ICD-10-CM

## 2016-04-09 DIAGNOSIS — D649 Anemia, unspecified: Secondary | ICD-10-CM | POA: Diagnosis not present

## 2016-04-09 DIAGNOSIS — D638 Anemia in other chronic diseases classified elsewhere: Secondary | ICD-10-CM | POA: Diagnosis present

## 2016-04-09 DIAGNOSIS — Z794 Long term (current) use of insulin: Secondary | ICD-10-CM

## 2016-04-09 DIAGNOSIS — Z7982 Long term (current) use of aspirin: Secondary | ICD-10-CM

## 2016-04-09 DIAGNOSIS — Z89512 Acquired absence of left leg below knee: Secondary | ICD-10-CM

## 2016-04-09 DIAGNOSIS — I132 Hypertensive heart and chronic kidney disease with heart failure and with stage 5 chronic kidney disease, or end stage renal disease: Secondary | ICD-10-CM | POA: Diagnosis not present

## 2016-04-09 DIAGNOSIS — Z8249 Family history of ischemic heart disease and other diseases of the circulatory system: Secondary | ICD-10-CM

## 2016-04-09 DIAGNOSIS — D631 Anemia in chronic kidney disease: Secondary | ICD-10-CM | POA: Diagnosis not present

## 2016-04-09 DIAGNOSIS — N186 End stage renal disease: Secondary | ICD-10-CM

## 2016-04-09 DIAGNOSIS — Z8546 Personal history of malignant neoplasm of prostate: Secondary | ICD-10-CM

## 2016-04-09 DIAGNOSIS — I959 Hypotension, unspecified: Secondary | ICD-10-CM | POA: Diagnosis present

## 2016-04-09 DIAGNOSIS — N400 Enlarged prostate without lower urinary tract symptoms: Secondary | ICD-10-CM | POA: Diagnosis present

## 2016-04-09 DIAGNOSIS — E1142 Type 2 diabetes mellitus with diabetic polyneuropathy: Secondary | ICD-10-CM | POA: Diagnosis present

## 2016-04-09 DIAGNOSIS — Z86718 Personal history of other venous thrombosis and embolism: Secondary | ICD-10-CM

## 2016-04-09 DIAGNOSIS — I252 Old myocardial infarction: Secondary | ICD-10-CM

## 2016-04-09 DIAGNOSIS — Z79899 Other long term (current) drug therapy: Secondary | ICD-10-CM

## 2016-04-09 DIAGNOSIS — Z89511 Acquired absence of right leg below knee: Secondary | ICD-10-CM

## 2016-04-09 DIAGNOSIS — R0600 Dyspnea, unspecified: Secondary | ICD-10-CM

## 2016-04-09 DIAGNOSIS — N2581 Secondary hyperparathyroidism of renal origin: Secondary | ICD-10-CM | POA: Diagnosis present

## 2016-04-09 DIAGNOSIS — K5909 Other constipation: Secondary | ICD-10-CM | POA: Diagnosis present

## 2016-04-09 DIAGNOSIS — Z951 Presence of aortocoronary bypass graft: Secondary | ICD-10-CM

## 2016-04-09 DIAGNOSIS — E1122 Type 2 diabetes mellitus with diabetic chronic kidney disease: Secondary | ICD-10-CM | POA: Diagnosis present

## 2016-04-09 DIAGNOSIS — E1151 Type 2 diabetes mellitus with diabetic peripheral angiopathy without gangrene: Secondary | ICD-10-CM | POA: Diagnosis present

## 2016-04-09 DIAGNOSIS — E1129 Type 2 diabetes mellitus with other diabetic kidney complication: Secondary | ICD-10-CM | POA: Diagnosis not present

## 2016-04-09 DIAGNOSIS — M898X9 Other specified disorders of bone, unspecified site: Secondary | ICD-10-CM | POA: Diagnosis present

## 2016-04-09 DIAGNOSIS — I120 Hypertensive chronic kidney disease with stage 5 chronic kidney disease or end stage renal disease: Secondary | ICD-10-CM | POA: Diagnosis not present

## 2016-04-09 HISTORY — DX: Non-ST elevation (NSTEMI) myocardial infarction: I21.4

## 2016-04-09 LAB — RENAL FUNCTION PANEL
ALBUMIN: 3.5 g/dL (ref 3.5–5.0)
ANION GAP: 12 (ref 5–15)
BUN: 20 mg/dL (ref 6–20)
CALCIUM: 9.9 mg/dL (ref 8.9–10.3)
CO2: 26 mmol/L (ref 22–32)
Chloride: 96 mmol/L — ABNORMAL LOW (ref 101–111)
Creatinine, Ser: 5.19 mg/dL — ABNORMAL HIGH (ref 0.61–1.24)
GFR, EST AFRICAN AMERICAN: 11 mL/min — AB (ref >60)
GFR, EST NON AFRICAN AMERICAN: 10 mL/min — AB (ref >60)
GLUCOSE: 337 mg/dL — AB (ref 65–99)
PHOSPHORUS: 3.9 mg/dL (ref 2.5–4.6)
POTASSIUM: 3.7 mmol/L (ref 3.5–5.1)
SODIUM: 134 mmol/L — AB (ref 135–145)

## 2016-04-09 LAB — BASIC METABOLIC PANEL
Anion gap: 14 (ref 5–15)
BUN: 19 mg/dL (ref 6–20)
CALCIUM: 10.3 mg/dL (ref 8.9–10.3)
CO2: 25 mmol/L (ref 22–32)
Chloride: 95 mmol/L — ABNORMAL LOW (ref 101–111)
Creatinine, Ser: 5.11 mg/dL — ABNORMAL HIGH (ref 0.61–1.24)
GFR calc Af Amer: 11 mL/min — ABNORMAL LOW (ref >60)
GFR, EST NON AFRICAN AMERICAN: 10 mL/min — AB (ref >60)
GLUCOSE: 365 mg/dL — AB (ref 65–99)
Potassium: 4 mmol/L (ref 3.5–5.1)
SODIUM: 134 mmol/L — AB (ref 135–145)

## 2016-04-09 LAB — CBC WITH DIFFERENTIAL/PLATELET
BASOS ABS: 0 10*3/uL (ref 0.0–0.1)
Basophils Relative: 0 %
Eosinophils Absolute: 0.1 10*3/uL (ref 0.0–0.7)
Eosinophils Relative: 1 %
HEMATOCRIT: 34.2 % — AB (ref 39.0–52.0)
Hemoglobin: 11.4 g/dL — ABNORMAL LOW (ref 13.0–17.0)
LYMPHS ABS: 1.6 10*3/uL (ref 0.7–4.0)
Lymphocytes Relative: 13 %
MCH: 31.4 pg (ref 26.0–34.0)
MCHC: 33.3 g/dL (ref 30.0–36.0)
MCV: 94.2 fL (ref 78.0–100.0)
MONO ABS: 0.7 10*3/uL (ref 0.1–1.0)
MONOS PCT: 6 %
NEUTROS ABS: 10 10*3/uL — AB (ref 1.7–7.7)
Neutrophils Relative %: 80 %
PLATELETS: 205 10*3/uL (ref 150–400)
RBC: 3.63 MIL/uL — ABNORMAL LOW (ref 4.22–5.81)
RDW: 14.5 % (ref 11.5–15.5)
WBC: 12.4 10*3/uL — ABNORMAL HIGH (ref 4.0–10.5)

## 2016-04-09 LAB — CBC
HEMATOCRIT: 33.1 % — AB (ref 39.0–52.0)
Hemoglobin: 10.7 g/dL — ABNORMAL LOW (ref 13.0–17.0)
MCH: 30.9 pg (ref 26.0–34.0)
MCHC: 32.3 g/dL (ref 30.0–36.0)
MCV: 95.7 fL (ref 78.0–100.0)
Platelets: 219 10*3/uL (ref 150–400)
RBC: 3.46 MIL/uL — ABNORMAL LOW (ref 4.22–5.81)
RDW: 14.8 % (ref 11.5–15.5)
WBC: 11.4 10*3/uL — AB (ref 4.0–10.5)

## 2016-04-09 LAB — GLUCOSE, CAPILLARY
GLUCOSE-CAPILLARY: 209 mg/dL — AB (ref 65–99)
Glucose-Capillary: 346 mg/dL — ABNORMAL HIGH (ref 65–99)
Glucose-Capillary: 151 mg/dL — ABNORMAL HIGH (ref 65–99)

## 2016-04-09 LAB — HEPARIN LEVEL (UNFRACTIONATED): Heparin Unfractionated: 0.27 IU/mL — ABNORMAL LOW (ref 0.30–0.70)

## 2016-04-09 LAB — I-STAT TROPONIN, ED: Troponin i, poc: 1.85 ng/mL (ref 0.00–0.08)

## 2016-04-09 MED ORDER — PRAVASTATIN SODIUM 40 MG PO TABS
40.0000 mg | ORAL_TABLET | Freq: Every day | ORAL | Status: DC
  Administered 2016-04-09 – 2016-04-13 (×5): 40 mg via ORAL
  Filled 2016-04-09 (×5): qty 1

## 2016-04-09 MED ORDER — QUETIAPINE FUMARATE 25 MG PO TABS
25.0000 mg | ORAL_TABLET | Freq: Every day | ORAL | Status: DC
  Administered 2016-04-09 – 2016-04-12 (×4): 25 mg via ORAL
  Filled 2016-04-09 (×5): qty 1

## 2016-04-09 MED ORDER — HEPARIN BOLUS VIA INFUSION
4000.0000 [IU] | Freq: Once | INTRAVENOUS | Status: AC
  Administered 2016-04-09: 4000 [IU] via INTRAVENOUS
  Filled 2016-04-09: qty 4000

## 2016-04-09 MED ORDER — POLYETHYLENE GLYCOL 3350 17 G PO PACK
17.0000 g | PACK | Freq: Every day | ORAL | Status: DC
  Administered 2016-04-09 – 2016-04-13 (×4): 17 g via ORAL
  Filled 2016-04-09 (×4): qty 1

## 2016-04-09 MED ORDER — HEPARIN SODIUM (PORCINE) 1000 UNIT/ML DIALYSIS
20.0000 [IU]/kg | INTRAMUSCULAR | Status: DC | PRN
  Filled 2016-04-09: qty 2

## 2016-04-09 MED ORDER — SODIUM CHLORIDE 0.9 % IV SOLN: 100.0000 mL | INTRAVENOUS | Status: DC | PRN

## 2016-04-09 MED ORDER — FERRIC CITRATE 1 GM 210 MG(FE) PO TABS
420.0000 mg | ORAL_TABLET | Freq: Three times a day (TID) | ORAL | Status: DC
  Administered 2016-04-09 – 2016-04-13 (×10): 420 mg via ORAL
  Filled 2016-04-09 (×16): qty 2

## 2016-04-09 MED ORDER — ONDANSETRON HCL 4 MG PO TABS: 4.0000 mg | ORAL_TABLET | Freq: Four times a day (QID) | ORAL | Status: DC | PRN

## 2016-04-09 MED ORDER — HYDROCODONE-ACETAMINOPHEN 5-325 MG PO TABS: 1.0000 | ORAL_TABLET | Freq: Four times a day (QID) | ORAL | Status: DC | PRN

## 2016-04-09 MED ORDER — ASPIRIN 81 MG PO CHEW
324.0000 mg | CHEWABLE_TABLET | Freq: Once | ORAL | Status: AC
  Administered 2016-04-09: 324 mg via ORAL
  Filled 2016-04-09: qty 4

## 2016-04-09 MED ORDER — HEPARIN SODIUM (PORCINE) 1000 UNIT/ML DIALYSIS
1000.0000 [IU] | INTRAMUSCULAR | Status: DC | PRN
  Filled 2016-04-09: qty 1

## 2016-04-09 MED ORDER — DOXERCALCIFEROL 4 MCG/2ML IV SOLN
3.0000 ug | INTRAVENOUS | Status: DC
  Administered 2016-04-12: 3 ug via INTRAVENOUS
  Filled 2016-04-09: qty 2

## 2016-04-09 MED ORDER — LIDOCAINE-EPINEPHRINE (PF) 2 %-1:200000 IJ SOLN
1.0000 mL | Freq: Once | INTRAMUSCULAR | Status: AC
  Administered 2016-04-09: 1 mL via INTRADERMAL

## 2016-04-09 MED ORDER — INSULIN DETEMIR 100 UNIT/ML ~~LOC~~ SOLN
17.0000 [IU] | Freq: Two times a day (BID) | SUBCUTANEOUS | Status: DC
  Administered 2016-04-09 – 2016-04-13 (×6): 17 [IU] via SUBCUTANEOUS
  Filled 2016-04-09 (×10): qty 0.17

## 2016-04-09 MED ORDER — ONDANSETRON HCL 4 MG/2ML IJ SOLN: 4.0000 mg | Freq: Four times a day (QID) | INTRAMUSCULAR | Status: DC | PRN

## 2016-04-09 MED ORDER — CLOPIDOGREL BISULFATE 75 MG PO TABS
75.0000 mg | ORAL_TABLET | Freq: Every day | ORAL | Status: DC
  Administered 2016-04-09 – 2016-04-13 (×5): 75 mg via ORAL
  Filled 2016-04-09 (×5): qty 1

## 2016-04-09 MED ORDER — INSULIN ASPART 100 UNIT/ML ~~LOC~~ SOLN
0.0000 [IU] | Freq: Three times a day (TID) | SUBCUTANEOUS | Status: DC
Start: 2016-04-09 — End: 2016-04-11
  Administered 2016-04-09 – 2016-04-10 (×2): 7 [IU] via SUBCUTANEOUS
  Administered 2016-04-11: 20 [IU] via SUBCUTANEOUS

## 2016-04-09 MED ORDER — ACETAMINOPHEN 650 MG RE SUPP: 650.0000 mg | Freq: Four times a day (QID) | RECTAL | Status: DC | PRN

## 2016-04-09 MED ORDER — AMLODIPINE BESYLATE 10 MG PO TABS
10.0000 mg | ORAL_TABLET | Freq: Every day | ORAL | Status: DC
  Administered 2016-04-09: 10 mg via ORAL
  Filled 2016-04-09 (×2): qty 1

## 2016-04-09 MED ORDER — PREGABALIN 75 MG PO CAPS
75.0000 mg | ORAL_CAPSULE | Freq: Every day | ORAL | Status: DC
  Administered 2016-04-09 – 2016-04-13 (×5): 75 mg via ORAL
  Filled 2016-04-09 (×6): qty 1

## 2016-04-09 MED ORDER — HEPARIN (PORCINE) IN NACL 100-0.45 UNIT/ML-% IJ SOLN
1200.0000 [IU]/h | INTRAMUSCULAR | Status: DC
  Administered 2016-04-09: 900 [IU]/h via INTRAVENOUS
  Administered 2016-04-10 – 2016-04-12 (×3): 1200 [IU]/h via INTRAVENOUS
  Filled 2016-04-09 (×4): qty 250

## 2016-04-09 MED ORDER — ALTEPLASE 2 MG IJ SOLR: 2.0000 mg | Freq: Once | INTRAMUSCULAR | Status: DC | PRN

## 2016-04-09 MED ORDER — TAMSULOSIN HCL 0.4 MG PO CAPS
0.4000 mg | ORAL_CAPSULE | Freq: Every day | ORAL | Status: DC
  Administered 2016-04-09 – 2016-04-13 (×5): 0.4 mg via ORAL
  Filled 2016-04-09 (×5): qty 1

## 2016-04-09 MED ORDER — HEPARIN SODIUM (PORCINE) 5000 UNIT/ML IJ SOLN: 5000.0000 [IU] | Freq: Three times a day (TID) | INTRAMUSCULAR | Status: DC

## 2016-04-09 MED ORDER — LIDOCAINE-PRILOCAINE 2.5-2.5 % EX CREA: 1.0000 "application " | TOPICAL_CREAM | CUTANEOUS | Status: DC | PRN

## 2016-04-09 MED ORDER — ASPIRIN 81 MG PO CHEW: 324.0000 mg | CHEWABLE_TABLET | Freq: Once | ORAL | Status: DC

## 2016-04-09 MED ORDER — NITROGLYCERIN 2 % TD OINT
1.0000 [in_us] | TOPICAL_OINTMENT | Freq: Four times a day (QID) | TRANSDERMAL | Status: DC
  Administered 2016-04-09 – 2016-04-12 (×11): 1 [in_us] via TOPICAL
  Filled 2016-04-09: qty 30
  Filled 2016-04-09: qty 1

## 2016-04-09 MED ORDER — ACETAMINOPHEN 325 MG PO TABS: 650.0000 mg | ORAL_TABLET | Freq: Four times a day (QID) | ORAL | Status: DC | PRN

## 2016-04-09 MED ORDER — LIDOCAINE HCL (PF) 1 % IJ SOLN: 5.0000 mL | INTRAMUSCULAR | Status: DC | PRN

## 2016-04-09 MED ORDER — SODIUM CHLORIDE 0.9 % IV SOLN: 62.5000 mg | INTRAVENOUS | Status: DC

## 2016-04-09 MED ORDER — PENTAFLUOROPROP-TETRAFLUOROETH EX AERO: 1.0000 "application " | INHALATION_SPRAY | CUTANEOUS | Status: DC | PRN

## 2016-04-09 NOTE — Progress Notes (Signed)
[place] for heparin Indication: chest pain/ACS  Heparin Dosing Weight: ~73 kg  Assessment: 89 yom with ESRD on HD presents with SOB on admit. Pharmacy consulted to dose heparin. Hx of DVT on warfarin previously, not currently on anticoagulation per patient/family. Hg 11.4, plt wnl, troponin 1.85. Estimated wt in the ED ~160 lbs per patient.  Heparin level just below goal at 0.27. No bleeding noted, will increase rate.   Goal of Therapy:  Heparin level 0.3-0.7 units/ml Monitor platelets by anticoagulation protocol: Yes   Plan:  Increase heparin to heparin at 1050 units/h Daily heparin level/CBC Monitor s/sx bleeding  Erin Hearing PharmD., BCPS Clinical Pharmacist Pager [phone] 04/09/2016 8:05 PM

## 2016-04-09 NOTE — Progress Notes (Signed)
[place] for heparin Indication: chest pain/ACS  Heparin Dosing Weight: ~73 kg  Assessment: 7 yom with ESRD on HD presents with SOB on admit. Pharmacy consulted to dose heparin. Hx of DVT on warfarin previously, not currently on anticoagulation per patient/family. Hg 11.4, plt wnl, troponin 1.85. Estimated wt in the ED ~160 lbs per patient.  Goal of Therapy:  Heparin level 0.3-0.7 units/ml Monitor platelets by anticoagulation protocol: Yes   Plan:  Heparin 4000 unit bolus Start heparin at 900 units/h 8h heparin level Daily heparin level/CBC Monitor s/sx bleeding   Elicia Lamp, PharmD, BCPS Clinical Pharmacist 04/09/2016 10:40 AM

## 2016-04-09 NOTE — ED Notes (Signed)
Dr. Lenna Sciara made aware of patients BP. He reports to give 1 inch of nitro paste.

## 2016-04-09 NOTE — ED Notes (Signed)
Pt's family arrived at bedside. RN attempt IV x 2, unsuccessful.

## 2016-04-09 NOTE — Progress Notes (Signed)
Report called to [place]  in HD.  Informed of BS 346 on floor and 365 in ED and not covered and instructed to hold insulin if pt has not eating anything for lunch and will pick him up now.  Karie Kirks, Therapist, sports.

## 2016-04-09 NOTE — ED Triage Notes (Signed)
Per ems- pt is coming from home, c/o sob since 11 pm last night. Couldn't get comfortable, felt like he was getting smoothered. CBG 394. RA 86%, does not wear oxygen. 2 L 96% . Is dialysis patient, due today. Last session was wed. Lung sounds clear. BP 154/84, HR 100.

## 2016-04-09 NOTE — Consult Note (Signed)
[facility] Renal Consultation Note  Indication for Consultation:  Management of ESRD/hemodialysis; anemia, hypertension/volume and secondary hyperparathyroidism  HPI: Aaron Long is a 76 y.o. male with ESRD sec DM  (op HD ADM Fm  MWF) has not missed HD  Admitted to OBSERVATION  With SOB with ER CXR =Chest x-ray showed interstitial edema pulmonary/ had elevated troponin was started on heparin and Cardiology  Consulted. He denies  Fevers, abd pain ,denies chest pain to me and denies  nausea, vomiting, diaphoresis. He has been leaving slightly above his  edw or  making it past week. Today by bed weights is 3.5 kg above his EDW. Currently on HD with  SOB resolving with UF and BP stable 111/68 on hd .     Past Medical History:  Diagnosis Date  . Abnormality of gait   . Anemia   . BPH (benign prostatic hypertrophy)   . Complication of anesthesia    "he gets delirious"  . Constipation    takes Miralax daily  . Coronary artery disease    a. s/p NSTEMI/CABGx4 in 2007 - LIMA-LAD, SVG-optional diagonal, SVVG-OM, SVG-dRCA. b. Nuc 05/2011: nonischemic.  . Diabetic peripheral neuropathy ([place])   . Dieulafoy lesion of rectum 05/03/2015  . Disorder of bone and cartilage, unspecified   . DVT (deep venous thrombosis) ([place])    a. Lower extremity DVT in 2013.  [name] ESRD on dialysis [facility])    M-W-F  . Gangrene of toe (HCC)    dry  . Hemorrhoids   . Hx of colonic polyps   . Hyperlipidemia   . Hypertension   . Hypertrophy of prostate without urinary obstruction and other lower urinary tract symptoms (LUTS)   . Ischemic cardiomyopathy    a. EF 40% in 2007. b. 51% by nuc 05/2011.  [name] Kidney stones   . Lower limb amputation, below knee   . Myocardial infarction 2005  . Neuromuscular disorder ([place])    diabetic neuropathy  . PAD (peripheral artery disease) ([place])    a. R CEA 2007. b. Hx L BKA in 2013. c. s/p LE angioplasty in 2014. d. Hx R BKA in 2014.  [name] Peripheral neuropathy ([place])   .  Prostate cancer ([place]) 2010  . Secondary hyperparathyroidism ([place])    Secondary Hyperpara- Thyroidism, Renal  . Type II diabetes mellitus ([place])     Past Surgical History:  Procedure Laterality Date  . ABDOMINAL AORTAGRAM N/A 05/28/2011   Procedure: ABDOMINAL Maxcine Ham;  Surgeon: Elam Dutch, MD;  Location: [facility] CATH LAB;  Service: Cardiovascular;  Laterality: N/A;  . ABDOMINAL AORTAGRAM N/A 06/02/2012   Procedure: ABDOMINAL Maxcine Ham;  Surgeon: Elam Dutch, MD;  Location[facility] CATH LAB;  Service: Cardiovascular;  Laterality: N/A;  . ABDOMINAL AORTAGRAM N/A 06/09/2012   Procedure: ABDOMINAL Maxcine Ham;  Surgeon: Elam Dutch, MD;  Location: [facility] CATH LAB;  Service: Cardiovascular;  Laterality: N/A;  . AMPUTATION  06/14/2011   Procedure: AMPUTATION DIGIT;  Surgeon: Elam Dutch, MD;  Location: [facility] OR;  Service: Vascular;  Laterality: Left;  Amputation Left fifth toe  . AMPUTATION  06/16/2011   Procedure: AMPUTATION BELOW KNEE;  Surgeon: Elam Dutch, MD;  Location: [place];  Service: Vascular;  Laterality: Left;  . AMPUTATION Right 06/12/2012   Procedure: AMPUTATION DIGIT;  Surgeon: Elam Dutch, MD;  Location: [place];  Service: Vascular;  Laterality: Right;  GREAT TOE  . AMPUTATION Right 08/08/2012   Procedure: AMPUTATION BELOW KNEE;  Surgeon: Elam Dutch,  MD;  Location: [place];  Service: Vascular;  Laterality: Right;  . AV FISTULA PLACEMENT Right 04/19/2014   Procedure: RIGHT ARM ARTERIOVENOUS (AV) FISTULA CREATION;  Surgeon: Mal Misty, MD;  Location: [place];  Service: Vascular;  Laterality: Right;  . AV FISTULA PLACEMENT Left 12/12/2014   Procedure: BRACHIOCEPHALIC ARTERIOVENOUS (AV) FISTULA CREATION;  Surgeon: Mal Misty, MD;  Location: [place];  Service: Vascular;  Laterality: Left;  . CARDIAC CATHETERIZATION  05/28/11  . CARDIAC CATHETERIZATION N/A 10/04/2014   Procedure: Left Heart Cath and Coronary Angiography;  Surgeon: Sherren Mocha, MD;  Location: [place] CV LAB;   Service: Cardiovascular;  Laterality: N/A;  . CAROTID ENDARTERECTOMY Right 2005  . CATARACT EXTRACTION W/ INTRAOCULAR LENS  IMPLANT, BILATERAL Bilateral 2011  . COLONOSCOPY    . COLONOSCOPY N/A 05/03/2015   Procedure: COLONOSCOPY;  Surgeon: Gatha Mayer, MD;  Location: [place];  Service: Endoscopy;  Laterality: N/A;  . CORONARY ARTERY BYPASS GRAFT  2005   CABG X4  . ENDARTERECTOMY Left 10/17/2014   Procedure: ENDARTERECTOMY CAROTID;  Surgeon: Mal Misty, MD;  Location: [place];  Service: Vascular;  Laterality: Left;  . I&D EXTREMITY  01/31/2012   Procedure: IRRIGATION AND DEBRIDEMENT EXTREMITY;  Surgeon: Elam Dutch, MD;  Location: [place];  Service: Vascular;  Laterality: Left;  I & D Left BKA   . INSERTION OF DIALYSIS CATHETER Right 04/19/2014   Procedure: INSERTION OF DIALYSIS CATHETER-INTERNAL JUGULAR;  Surgeon: Mal Misty, MD;  Location: [place];  Service: Vascular;  Laterality: Right;  . INSERTION OF DIALYSIS CATHETER N/A 11/06/2014   Procedure: INSERTION OF 23cm DIALYSIS CATHETER - right internal jugular;  Surgeon: Mal Misty, MD;  Location: [place];  Service: Vascular;  Laterality: N/A;  . KNEE CARTILAGE SURGERY Left 1964  . LIGATION OF ARTERIOVENOUS  FISTULA Right 11/06/2014   Procedure: LIGATION OF RADIOCEPHALIC ARTERIOVENOUS FISTULA;  Surgeon: Mal Misty, MD;  Location: [place];  Service: Vascular;  Laterality: Right;  . LIGATION OF COMPETING BRANCHES OF ARTERIOVENOUS FISTULA Right 06/19/2014   Procedure: Right Arm LIGATION OF COMPETING BRANCHES OF RADIOCEPHALIC ARTERIOVENOUS FISTULA;  Surgeon: Mal Misty, MD;  Location: [place];  Service: Vascular;  Laterality: Right;  . PROSTATECTOMY  2009      Family History  Problem Relation Age of Onset  . Hyperlipidemia Mother   . Hypertension Mother   . Cancer Mother   . Hyperlipidemia Father   . Hypertension Father   . Kidney disease Father   . Heart disease Sister   . Alzheimer's disease Sister   . Cancer Brother    . Other Sister   . Coronary artery disease Neg Hx   . Anesthesia problems Neg Hx   . Hypotension Neg Hx   . Malignant hyperthermia Neg Hx   . Pseudochol deficiency Neg Hx       reports that he quit smoking about 24 years ago. His smoking use included Cigarettes. He has a 30.00 pack-year smoking history. He has never used smokeless tobacco. He reports that he drinks alcohol. He reports that he uses drugs, including Marijuana.   Allergies  Allergen Reactions  . Oxycodone Other (See Comments)    Hallucinations    Prior to Admission medications   Medication Sig Start Date End Date Taking? Authorizing Provider  amLODipine (NORVASC) 10 MG tablet Take one tablet by mouth once daily Patient taking differently: Take 10 mg by mouth daily.  04/05/16  Yes Estill Dooms, MD  ASPIRIN LOW DOSE 81 MG EC tablet TAKE 1 TABLET BY MOUTH EVERY MORNING 10/03/15  Yes Tiffany L Reed, DO  AURYXIA 1 GM 210 MG(Fe) tablet Take 420 mg by mouth 3 (three) times daily. 03/18/16  Yes Historical Provider, MD  clopidogrel (PLAVIX) 75 MG tablet Take one tablet by mouth once daily Patient taking differently: Take 75 mg by mouth daily.  04/05/16  Yes Estill Dooms, MD  HYDROcodone-acetaminophen (NORCO/VICODIN) 5-325 MG tablet Take 1 tablet by mouth every 6 (six) hours as needed for moderate pain. 01/09/16  Yes Tiffany L Reed, DO  insulin aspart (NOVOLOG) 100 UNIT/ML FlexPen Inject 5 Units into the skin 3 (three) times daily with meals. Inject 5 units three times a day with meals if blood sugar greater than 150 only, DX: 250.71 04/05/16  Yes Estill Dooms, MD  Insulin Detemir (LEVEMIR FLEXTOUCH) 100 UNIT/ML Pen INJECT 17 UNITS SUBCUTANEOUSLY TWICE DAILY Patient taking differently: Inject 17 Units into the skin 2 (two) times daily.  04/05/16  Yes Estill Dooms, MD  polyethylene glycol [facility]) packet Take 17 g by mouth daily. 04/05/16  Yes Estill Dooms, MD  pravastatin (PRAVACHOL) 40 MG tablet Take one tablet by mouth once  daily Patient taking differently: Take 40 mg by mouth daily.  04/05/16  Yes Estill Dooms, MD  pregabalin (LYRICA) 75 MG capsule Take one tablet by mouth once daily for pains Patient taking differently: Take 75 mg by mouth daily.  04/05/16  Yes Estill Dooms, MD  QUEtiapine (SEROQUEL) 25 MG tablet Take 1 tablet (25 mg total) by mouth daily. 04/05/16  Yes Estill Dooms, MD  tamsulosin (FLOMAX) 0.4 MG CAPS capsule Take one capsule by mouth once daily Patient taking differently: Take 0.4 mg by mouth daily.  04/05/16  Yes Estill Dooms, MD  calcium acetate (PHOSLO) 667 MG capsule TAKE 1 CAPSULE BY MOUTH THREE TIMES DAILY WITH MEALS Patient not taking: Reported on 04/09/2016 08/27/15   Gayland Curry, DO     Anti-infectives    None      Results for orders placed or performed during the hospital encounter of 04/09/16 (from the past 48 hour(s))  Basic metabolic panel     Status: Abnormal   Collection Time: 04/09/16  9:03 AM  Result Value Ref Range   Sodium 134 (L) 135 - 145 mmol/L   Potassium 4.0 3.5 - 5.1 mmol/L   Chloride 95 (L) 101 - 111 mmol/L   CO2 25 22 - 32 mmol/L   Glucose, Bld 365 (H) 65 - 99 mg/dL   BUN 19 6 - 20 mg/dL   Creatinine, Ser 5.11 (H) 0.61 - 1.24 mg/dL   Calcium 10.3 8.9 - 10.3 mg/dL   GFR calc non Af Amer 10 (L) >60 mL/min   GFR calc Af Amer 11 (L) >60 mL/min    Comment: (NOTE) The eGFR has been calculated using the CKD EPI equation. This calculation has not been validated in all clinical situations. eGFR's persistently <60 mL/min signify possible Chronic Kidney Disease.    Anion gap 14 5 - 15  CBC with Differential/Platelet     Status: Abnormal   Collection Time: 04/09/16  9:03 AM  Result Value Ref Range   WBC 12.4 (H) 4.0 - 10.5 K/uL    Comment: REPEATED TO VERIFY WHITE COUNT CONFIRMED ON SMEAR    RBC 3.63 (L) 4.22 - 5.81 MIL/uL   Hemoglobin 11.4 (L) 13.0 - 17.0 g/dL   HCT 34.2 (L) 39.0 -  52.0 %   MCV 94.2 78.0 - 100.0 fL   MCH 31.4 26.0 - 34.0 pg    MCHC 33.3 30.0 - 36.0 g/dL   RDW 14.5 11.5 - 15.5 %   Platelets 205 150 - 400 K/uL    Comment: SPECIMEN CHECKED FOR CLOTS REPEATED TO VERIFY PLATELET COUNT CONFIRMED BY SMEAR    Neutrophils Relative % 80 %   Lymphocytes Relative 13 %   Monocytes Relative 6 %   Eosinophils Relative 1 %   Basophils Relative 0 %   Neutro Abs 10.0 (H) 1.7 - 7.7 K/uL   Lymphs Abs 1.6 0.7 - 4.0 K/uL   Monocytes Absolute 0.7 0.1 - 1.0 K/uL   Eosinophils Absolute 0.1 0.0 - 0.7 K/uL   Basophils Absolute 0.0 0.0 - 0.1 K/uL   RBC Morphology MORPHOLOGY UNREMARKABLE   I-Stat Troponin, ED (not at [facility])     Status: Abnormal   Collection Time: 04/09/16  9:13 AM  Result Value Ref Range   Troponin i, poc 1.85 (HH) 0.00 - 0.08 ng/mL   Comment NOTIFIED PHYSICIAN    Comment 3            Comment: Due to the release kinetics of cTnI, a negative result within the first hours of the onset of symptoms does not rule out myocardial infarction with certainty. If myocardial infarction is still suspected, repeat the test at appropriate intervals.   Glucose, capillary     Status: Abnormal   Collection Time: 04/09/16 11:49 AM  Result Value Ref Range   Glucose-Capillary 346 (H) 65 - 99 mg/dL   Comment 1 Notify RN   CBC     Status: Abnormal   Collection Time: 04/09/16 12:14 PM  Result Value Ref Range   WBC 11.4 (H) 4.0 - 10.5 K/uL   RBC 3.46 (L) 4.22 - 5.81 MIL/uL   Hemoglobin 10.7 (L) 13.0 - 17.0 g/dL   HCT 33.1 (L) 39.0 - 52.0 %   MCV 95.7 78.0 - 100.0 fL   MCH 30.9 26.0 - 34.0 pg   MCHC 32.3 30.0 - 36.0 g/dL   RDW 14.8 11.5 - 15.5 %   Platelets 219 150 - 400 K/uL   ROS: see HPI  And also reports Chronic R hand numbness and weakness since R arm access per Dr. Kellie Simmering, has FU with Dr. Oneida Alar in [place] in April .  Physical Exam: Vitals:   04/09/16 1100 04/09/16 1151  BP: (!) 177/85 (!) 189/88  Pulse: 84 97  Resp: 18 18  Temp:  99.4 F (37.4 C)     General: alert Obese BM on HD  NAD  , OX3 , pleasant  HEENT: [place] ,  EOMI , MMM Neck: supple Heart: RRR, No mur, rub or Gal. Lungs: CTA  anter.  non labored breathing on HD  Abdomen: Obese,  BS  Pos . Soft , NT, ND  Extremities: Bilat BKA  No edema  Skin: warm dry, no overt rash Neuro: alert ,OX3,  Moves all extrem Dialysis Access: Patent on HD LUA AVF  Dialysis Orders: Center: [facility]   on MWF . EDW 72.5  HD Bath 2k,2.25ca  Time 4hr Heparin 2200. Access LUA AVF     Hec 7 mcg IV/HD  Mircera 48mg  q 4weeks  (last given 04/05/16)     Venofer  50 mg qwkly hd  Other  Op labs last week = hgb 10.5/ Ca 10.3/ phos 3.5 / pth 243   Assessment/Plan 1. Volume overload /  interstitial pulmonary edema = HD UF  Now wt up per bed wts?? Lower edw as op / Trop.+ ?? Vol up in esrd pt/ Card  To eval per admit  2. ESRD -  NL MWF  HD  schedule - hd today  3. Hypertension/volume  - bp stable 111/68  with uf on hd/ on amlodipine 10 mg hs as op  4. Anemia  -  HGB 10.7   esa given 04/05/16 /  esa q2 wks / fu hgb trend/ fe weekly wed hd  5. Metabolic bone disease -  hec on hd will decr to 3 instead of 7 with op ca>10 and pth down  6. CAD - on Plavix  7. DM type 2 - per admit  8. HO bipolar -on Seroquel   Ernest Haber, PA-C [place] [phone] 04/09/2016, 12:47 PM   Renal Attending:  I agree with note above. Has not been missing HD and prob needs a lower EDW to optimize volume status. Jyren Cerasoli C

## 2016-04-09 NOTE — H&P (Signed)
[facility] History and Physical  SAHAJ Long QPY:[id number] DOB: 1940-03-15 DOA: 04/09/2016  Referring physician:  PCP: Hollace Kinnier, DO   Chief Complaint: "I had trouble breathing."  HPI: Aaron Long is a 76 y.o. male  with past medical history significant for BPH, anemia, end-stage renal disease, diabetes, history of venous thromboembolism, heart attack who presented to the emergency room chief complaint shortness of breath. The week preceding yesterday patient had been in normal state of health. Patient had dialysis yesterday. Patient had a full normal treatment without incident. Yesterday evening around 9:00 patient began to develop shortness of breath. This did not improve overnight. Patient's wife activated EMS and patient was transferred to the hospital for evaluation. Patient denies any chest pain, nausea, vomiting, diaphoresis. Patient has not had any sick contacts.  ED course: Chest x-ray showed interstitial edema pulmonary and patient was given Nitropaste. Nephrology and cardiology were consulted. Patient had elevated troponin was started on heparin. Hospitalist consulted for admission.   Review of Systems:  As per HPI otherwise 10 point review of systems negative.    Past Medical History:  Diagnosis Date  . Abnormality of gait   . Anemia   . BPH (benign prostatic hypertrophy)   . Complication of anesthesia    "he gets delirious"  . Constipation    takes Miralax daily  . Coronary artery disease    a. s/p NSTEMI/CABGx4 in 2007 - LIMA-LAD, SVG-optional diagonal, SVVG-OM, SVG-dRCA. b. Nuc 05/2011: nonischemic.  . Diabetic peripheral neuropathy ([place])   . Dieulafoy lesion of rectum 05/03/2015  . Disorder of bone and cartilage, unspecified   . DVT (deep venous thrombosis) ([place])    a. Lower extremity DVT in 2013.  [name] ESRD on dialysis [facility])    M-W-F  . Gangrene of toe (HCC)    dry  . Hemorrhoids   . Hx of colonic polyps   . Hyperlipidemia   . Hypertension    . Hypertrophy of prostate without urinary obstruction and other lower urinary tract symptoms (LUTS)   . Ischemic cardiomyopathy    a. EF 40% in 2007. b. 51% by nuc 05/2011.  [name] Kidney stones   . Lower limb amputation, below knee   . Myocardial infarction 2005  . Neuromuscular disorder ([place])    diabetic neuropathy  . PAD (peripheral artery disease) ([place])    a. R CEA 2007. b. Hx L BKA in 2013. c. s/p LE angioplasty in 2014. d. Hx R BKA in 2014.  [name] Peripheral neuropathy ([place])   . Prostate cancer ([place]) 2010  . Secondary hyperparathyroidism ([place])    Secondary Hyperpara- Thyroidism, Renal  . Type II diabetes mellitus ([place])    Past Surgical History:  Procedure Laterality Date  . ABDOMINAL AORTAGRAM N/A 05/28/2011   Procedure: ABDOMINAL Maxcine Ham;  Surgeon: Elam Dutch, MD;  Location: [facility] CATH LAB;  Service: Cardiovascular;  Laterality: N/A;  . ABDOMINAL AORTAGRAM N/A 06/02/2012   Procedure: ABDOMINAL Maxcine Ham;  Surgeon: Elam Dutch, MD;  Location: [facility] CATH LAB;  Service: Cardiovascular;  Laterality: N/A;  . ABDOMINAL AORTAGRAM N/A 06/09/2012   Procedure: ABDOMINAL Maxcine Ham;  Surgeon: Elam Dutch, MD;  Location: [facility] CATH LAB;  Service: Cardiovascular;  Laterality: N/A;  . AMPUTATION  06/14/2011   Procedure: AMPUTATION DIGIT;  Surgeon: Elam Dutch, MD;  Location: [facility] OR;  Service: Vascular;  Laterality: Left;  Amputation Left fifth toe  . AMPUTATION  06/16/2011   Procedure: AMPUTATION BELOW KNEE;  Surgeon: Elam Dutch, MD;  Location: [facility] OR;  Service: Vascular;  Laterality: Left;  . AMPUTATION Right 06/12/2012   Procedure: AMPUTATION DIGIT;  Surgeon: Elam Dutch, MD;  Location: [facility] OR;  Service: Vascular;  Laterality: Right;  GREAT TOE  . AMPUTATION Right 08/08/2012   Procedure: AMPUTATION BELOW KNEE;  Surgeon: Elam Dutch, MD;  Location: [place];  Service: Vascular;  Laterality: Right;  . AV FISTULA PLACEMENT Right 04/19/2014   Procedure: RIGHT ARM ARTERIOVENOUS (AV) FISTULA  CREATION;  Surgeon: Mal Misty, MD;  Location: [place];  Service: Vascular;  Laterality: Right;  . AV FISTULA PLACEMENT Left 12/12/2014   Procedure: BRACHIOCEPHALIC ARTERIOVENOUS (AV) FISTULA CREATION;  Surgeon: Mal Misty, MD;  Location: [place];  Service: Vascular;  Laterality: Left;  . CARDIAC CATHETERIZATION  05/28/11  . CARDIAC CATHETERIZATION N/A 10/04/2014   Procedure: Left Heart Cath and Coronary Angiography;  Surgeon: Sherren Mocha, MD;  Location: [place] CV LAB;  Service: Cardiovascular;  Laterality: N/A;  . CAROTID ENDARTERECTOMY Right 2005  . CATARACT EXTRACTION W/ INTRAOCULAR LENS  IMPLANT, BILATERAL Bilateral 2011  . COLONOSCOPY    . COLONOSCOPY N/A 05/03/2015   Procedure: COLONOSCOPY;  Surgeon: Gatha Mayer, MD;  Location: [place];  Service: Endoscopy;  Laterality: N/A;  . CORONARY ARTERY BYPASS GRAFT  2005   CABG X4  . ENDARTERECTOMY Left 10/17/2014   Procedure: ENDARTERECTOMY CAROTID;  Surgeon: Mal Misty, MD;  Location: [place];  Service: Vascular;  Laterality: Left;  . I&D EXTREMITY  01/31/2012   Procedure: IRRIGATION AND DEBRIDEMENT EXTREMITY;  Surgeon: Elam Dutch, MD;  Location: [place];  Service: Vascular;  Laterality: Left;  I & D Left BKA   . INSERTION OF DIALYSIS CATHETER Right 04/19/2014   Procedure: INSERTION OF DIALYSIS CATHETER-INTERNAL JUGULAR;  Surgeon: Mal Misty, MD;  Location: [place];  Service: Vascular;  Laterality: Right;  . INSERTION OF DIALYSIS CATHETER N/A 11/06/2014   Procedure: INSERTION OF 23cm DIALYSIS CATHETER - right internal jugular;  Surgeon: Mal Misty, MD;  Location: [place];  Service: Vascular;  Laterality: N/A;  . KNEE CARTILAGE SURGERY Left 1964  . LIGATION OF ARTERIOVENOUS  FISTULA Right 11/06/2014   Procedure: LIGATION OF RADIOCEPHALIC ARTERIOVENOUS FISTULA;  Surgeon: Mal Misty, MD;  Location: [place];  Service: Vascular;  Laterality: Right;  . LIGATION OF COMPETING BRANCHES OF ARTERIOVENOUS FISTULA Right 06/19/2014    Procedure: Right Arm LIGATION OF COMPETING BRANCHES OF RADIOCEPHALIC ARTERIOVENOUS FISTULA;  Surgeon: Mal Misty, MD;  Location: [place];  Service: Vascular;  Laterality: Right;  . PROSTATECTOMY  2009   Social History:  reports that he quit smoking about 24 years ago. His smoking use included Cigarettes. He has a 30.00 pack-year smoking history. He has never used smokeless tobacco. He reports that he drinks alcohol. He reports that he uses drugs, including Marijuana.  Allergies  Allergen Reactions  . Oxycodone Other (See Comments)    Hallucinations    Family History  Problem Relation Age of Onset  . Hyperlipidemia Mother   . Hypertension Mother   . Cancer Mother   . Hyperlipidemia Father   . Hypertension Father   . Kidney disease Father   . Heart disease Sister   . Alzheimer's disease Sister   . Cancer Brother   . Other Sister   . Coronary artery disease Neg Hx   . Anesthesia problems Neg Hx   . Hypotension Neg Hx   . Malignant hyperthermia Neg Hx   . Pseudochol deficiency  Neg Hx      Prior to Admission medications   Medication Sig Start Date End Date Taking? Authorizing Provider  amLODipine (NORVASC) 10 MG tablet Take one tablet by mouth once daily Patient taking differently: Take 10 mg by mouth daily.  04/05/16  Yes Estill Dooms, MD  ASPIRIN LOW DOSE 81 MG EC tablet TAKE 1 TABLET BY MOUTH EVERY MORNING 10/03/15  Yes Tiffany L Reed, DO  AURYXIA 1 GM 210 MG(Fe) tablet Take 420 mg by mouth 3 (three) times daily. 03/18/16  Yes Historical Provider, MD  clopidogrel (PLAVIX) 75 MG tablet Take one tablet by mouth once daily Patient taking differently: Take 75 mg by mouth daily.  04/05/16  Yes Estill Dooms, MD  HYDROcodone-acetaminophen (NORCO/VICODIN) 5-325 MG tablet Take 1 tablet by mouth every 6 (six) hours as needed for moderate pain. 01/09/16  Yes Tiffany L Reed, DO  insulin aspart (NOVOLOG) 100 UNIT/ML FlexPen Inject 5 Units into the skin 3 (three) times daily with meals.  Inject 5 units three times a day with meals if blood sugar greater than 150 only, DX: 250.71 04/05/16  Yes Estill Dooms, MD  Insulin Detemir (LEVEMIR FLEXTOUCH) 100 UNIT/ML Pen INJECT 17 UNITS SUBCUTANEOUSLY TWICE DAILY Patient taking differently: Inject 17 Units into the skin 2 (two) times daily.  04/05/16  Yes Estill Dooms, MD  polyethylene glycol [facility]) packet Take 17 g by mouth daily. 04/05/16  Yes Estill Dooms, MD  pravastatin (PRAVACHOL) 40 MG tablet Take one tablet by mouth once daily Patient taking differently: Take 40 mg by mouth daily.  04/05/16  Yes Estill Dooms, MD  pregabalin (LYRICA) 75 MG capsule Take one tablet by mouth once daily for pains Patient taking differently: Take 75 mg by mouth daily.  04/05/16  Yes Estill Dooms, MD  QUEtiapine (SEROQUEL) 25 MG tablet Take 1 tablet (25 mg total) by mouth daily. 04/05/16  Yes Estill Dooms, MD  tamsulosin (FLOMAX) 0.4 MG CAPS capsule Take one capsule by mouth once daily Patient taking differently: Take 0.4 mg by mouth daily.  04/05/16  Yes Estill Dooms, MD  calcium acetate (PHOSLO) 667 MG capsule TAKE 1 CAPSULE BY MOUTH THREE TIMES DAILY WITH MEALS Patient not taking: Reported on 04/09/2016 08/27/15   Gayland Curry, DO   Physical Exam: Vitals:   04/09/16 0830 04/09/16 0834 04/09/16 0936 04/09/16 0945  BP: (!) 194/91  (!) 199/95 (!) 190/86  Pulse: (!) 101  96 90  Resp: (!) 30 (!) 25 (!) 24 (!) 23  Temp:      TempSrc:      SpO2: 95% 95% 97% 98%    Wt Readings from Last 3 Encounters:  10/30/15 77.1 kg (170 lb)  05/05/15 77.3 kg (170 lb 6.7 oz)  04/10/15 86.6 kg (191 lb)    General:  Appears calm and comfortable, A&Ox3 Eyes:  PERRL, EOMI, normal lids, iris ENT:  grossly normal hearing, lips & tongue Neck:  no LAD, masses or thyromegaly Cardiovascular:  RRR, no m/r/g. No LE edema.  Respiratory:  decr air movement, no w/r/r. Normal respiratory effort. Abdomen:  soft, ntnd Skin:  no rash or induration seen on limited  exam Musculoskeletal:  grossly normal tone BUE/BLE Psychiatric:  grossly normal mood and affect, speech fluent and appropriate Neurologic:  CN 2-12 grossly intact, moves all extremities in coordinated fashion.          Labs on Admission:  Basic Metabolic Panel:  Recent Labs Lab 04/09/16  0903  NA 134*  K 4.0  CL 95*  CO2 25  GLUCOSE 365*  BUN 19  CREATININE 5.11*  CALCIUM 10.3   Liver Function Tests: No results for input(s): AST, ALT, ALKPHOS, BILITOT, PROT, ALBUMIN in the last 168 hours. No results for input(s): LIPASE, AMYLASE in the last 168 hours. No results for input(s): AMMONIA in the last 168 hours. CBC:  Recent Labs Lab 04/09/16 0903  WBC 12.4*  NEUTROABS 10.0*  HGB 11.4*  HCT 34.2*  MCV 94.2  PLT 205   Cardiac Enzymes: No results for input(s): CKTOTAL, CKMB, CKMBINDEX, TROPONINI in the last 168 hours.  BNP (last 3 results) No results for input(s): BNP in the last 8760 hours.  ProBNP (last 3 results) No results for input(s): PROBNP in the last 8760 hours.   Creatinine clearance cannot be calculated (Unknown ideal weight.)  CBG: No results for input(s): GLUCAP in the last 168 hours.  Radiological Exams on Admission: Dg Chest Port 1 View  Result Date: 04/09/2016 CLINICAL DATA:  Shortness of Breath EXAM: PORTABLE CHEST 1 VIEW COMPARISON:  11/06/2014 FINDINGS: Interval removal of right dialysis catheter. Prior CABG. Heart is borderline in size. There is diffuse interstitial prominence and vascular congestion. This may reflect mild interstitial edema. No effusions. No acute bony abnormality. IMPRESSION: Vascular congestion with increasing interstitial prominence, possibly interstitial edema. Electronically Signed   By: Rolm Baptise M.D.   On: 04/09/2016 09:19    EKG: Independently reviewed. No STEMI, ST depression  Assessment/Plan Principal Problem:   CHF (congestive heart failure) (HCC) Active Problems:   HTN (hypertension)   CAD (coronary artery  disease)   Diabetes mellitus with peripheral vascular disease (HCC)  CHF acute & abn EKG & elevated trop 1) CP. - serial trop ordered, initial pos - prn ntg cp - asa QD - ECHO ordered for AM - tele bed, cardiac monitoring - zofran prn for nausea Cont nitro paste Cardio consult by EDP Started on heparin Repeat CXR in AM  ESRD T-Th-Sat Nephro consulted by EDP Cont auryxia  Hypertension When necessary hydralazine 10 mg IV as needed for severe blood pressure Cont amlodipine  CAD Continue Plavix  Diabetes sliding scale insulin Levemir 17 units twice a day  Chronic constipation Continue MiraLAX  Hyperlipidemia Continue statin  Chronic pain Continue Lyrica, Norco  Bipolar Continue Seroquel  BPH Continue Flomax  Code Status: FULL  DVT Prophylaxis: heparin Family Communication: wife at bedside Disposition Plan: Pending Improvement  Status: obs tele  Elwin Mocha, MD Family Medicine [facility] [email or web address] Password [email or web address]

## 2016-04-09 NOTE — ED Notes (Addendum)
Dialysis reports he will be going around 12 today.

## 2016-04-09 NOTE — ED Provider Notes (Signed)
[facility] DEPT Provider Note   CSN: [id number] Arrival date & time: 04/09/16  9924     History   Chief Complaint Chief Complaint  Patient presents with  . Shortness of Breath    HPI Aaron Long is a 76 y.o. male.Complains of shortness of breath gradual onset last night. Nothing made symptoms better or worse. No chest pain. No cough. No other associated symptoms. Last hemodialysis session was 2 days ago. EMS reports pulse oximetry on room air was 86% consistent with hypoxemia. Pulse oximetry on 2 L initially on arrival here was 91%. Patient now breathing normally. Asymptomatic. Denies any chest pain.  HPI  Past Medical History:  Diagnosis Date  . Abnormality of gait   . Anemia   . BPH (benign prostatic hypertrophy)   . Complication of anesthesia    "he gets delirious"  . Constipation    takes Miralax daily  . Coronary artery disease    a. s/p NSTEMI/CABGx4 in 2007 - LIMA-LAD, SVG-optional diagonal, SVVG-OM, SVG-dRCA. b. Nuc 05/2011: nonischemic.  . Diabetic peripheral neuropathy ([employer])   . Dieulafoy lesion of rectum 05/03/2015  . Disorder of bone and cartilage, unspecified   . DVT (deep venous thrombosis) ([place])    a. Lower extremity DVT in 2013.  [name] ESRD on dialysis [facility])    M-W-F  . Gangrene of toe (HCC)    dry  . Hemorrhoids   . Hx of colonic polyps   . Hyperlipidemia   . Hypertension   . Hypertrophy of prostate without urinary obstruction and other lower urinary tract symptoms (LUTS)   . Ischemic cardiomyopathy    a. EF 40% in 2007. b. 51% by nuc 05/2011.  [name] Kidney stones   . Lower limb amputation, below knee   . Myocardial infarction 2005  . Neuromuscular disorder ([place])    diabetic neuropathy  . PAD (peripheral artery disease) ([place])    a. R CEA 2007. b. Hx L BKA in 2013. c. s/p LE angioplasty in 2014. d. Hx R BKA in 2014.  [name] Peripheral neuropathy ([place])   . Prostate cancer ([place]) 2010  . Secondary hyperparathyroidism ([place])    Secondary Hyperpara-  Thyroidism, Renal  . Type II diabetes mellitus ([place])     Patient Active Problem List   Diagnosis Date Noted  . Hereditary and idiopathic peripheral neuropathy 06/26/2015  . History of pressure ulcer 06/26/2015  . Dieulafoy lesion of rectum 05/03/2015  . Rectal bleeding 05/01/2015  . Acute blood loss anemia 05/01/2015  . Numbness and tingling in right hand 11/05/2014  . Right hand pain 11/05/2014  . Carotid stenosis   . TIA (transient ischemic attack) 09/30/2014  . ESRD on dialysis ([place])   . Anemia of chronic disease 05/02/2014  . Insomnia due to stress 05/02/2014  . Physical deconditioning 04/25/2014  . Syncope 04/17/2014  . Chest pain 04/08/2014  . Status post bilateral below knee amputation ([place]) 04/02/2014  . Cervical disc disorder with radiculopathy of cervical region 08/07/2013  . Drug-induced constipation 06/13/2013  . DM type 2, uncontrolled, with renal complications ([place]) 26/83/4196  . Hypertensive renal disease 06/13/2013  . Hyperlipidemia LDL goal <70 12/06/2012  . Diabetes mellitus with peripheral vascular disease ([place]) 09/06/2012  . GERD (gastroesophageal reflux disease) 09/06/2012  . Anemia in chronic renal disease 07/24/2012  . Atherosclerosis of native arteries of the extremities with ulceration(440.23) 06/01/2012  . Hyperkalemia 04/24/2012  . HTN (hypertension) 04/28/2011  . CAD (coronary artery disease) 04/28/2011  . PAD (peripheral artery disease) ([place])  04/28/2011  . Prostate cancer ([place]) 04/28/2011    Past Surgical History:  Procedure Laterality Date  . ABDOMINAL AORTAGRAM N/A 05/28/2011   Procedure: ABDOMINAL Maxcine Ham;  Surgeon: Elam Dutch, MD;  Location: [facility] CATH LAB;  Service: Cardiovascular;  Laterality: N/A;  . ABDOMINAL AORTAGRAM N/A 06/02/2012   Procedure: ABDOMINAL Maxcine Ham;  Surgeon: Elam Dutch, MD;  Location: [facility] CATH LAB;  Service: Cardiovascular;  Laterality: N/A;  . ABDOMINAL AORTAGRAM N/A 06/09/2012   Procedure: ABDOMINAL  Maxcine Ham;  Surgeon: Elam Dutch, MD;  Location: [facility] CATH LAB;  Service: Cardiovascular;  Laterality: N/A;  . AMPUTATION  06/14/2011   Procedure: AMPUTATION DIGIT;  Surgeon: Elam Dutch, MD;  Location: [facility] OR;  Service: Vascular;  Laterality: Left;  Amputation Left fifth toe  . AMPUTATION  06/16/2011   Procedure: AMPUTATION BELOW KNEE;  Surgeon: Elam Dutch, MD;  Location: [place];  Service: Vascular;  Laterality: Left;  . AMPUTATION Right 06/12/2012   Procedure: AMPUTATION DIGIT;  Surgeon: Elam Dutch, MD;  Location: [place];  Service: Vascular;  Laterality: Right;  GREAT TOE  . AMPUTATION Right 08/08/2012   Procedure: AMPUTATION BELOW KNEE;  Surgeon: Elam Dutch, MD;  Location: [place];  Service: Vascular;  Laterality: Right;  . AV FISTULA PLACEMENT Right 04/19/2014   Procedure: RIGHT ARM ARTERIOVENOUS (AV) FISTULA CREATION;  Surgeon: Mal Misty, MD;  Location: [place];  Service: Vascular;  Laterality: Right;  . AV FISTULA PLACEMENT Left 12/12/2014   Procedure: BRACHIOCEPHALIC ARTERIOVENOUS (AV) FISTULA CREATION;  Surgeon: Mal Misty, MD;  Location: [place];  Service: Vascular;  Laterality: Left;  . CARDIAC CATHETERIZATION  05/28/11  . CARDIAC CATHETERIZATION N/A 10/04/2014   Procedure: Left Heart Cath and Coronary Angiography;  Surgeon: Sherren Mocha, MD;  Location: [place] CV LAB;  Service: Cardiovascular;  Laterality: N/A;  . CAROTID ENDARTERECTOMY Right 2005  . CATARACT EXTRACTION W/ INTRAOCULAR LENS  IMPLANT, BILATERAL Bilateral 2011  . COLONOSCOPY    . COLONOSCOPY N/A 05/03/2015   Procedure: COLONOSCOPY;  Surgeon: Gatha Mayer, MD;  Location: [place];  Service: Endoscopy;  Laterality: N/A;  . CORONARY ARTERY BYPASS GRAFT  2005   CABG X4  . ENDARTERECTOMY Left 10/17/2014   Procedure: ENDARTERECTOMY CAROTID;  Surgeon: Mal Misty, MD;  Location: [place];  Service: Vascular;  Laterality: Left;  . I&D EXTREMITY  01/31/2012   Procedure: IRRIGATION AND DEBRIDEMENT  EXTREMITY;  Surgeon: Elam Dutch, MD;  Location: [place];  Service: Vascular;  Laterality: Left;  I & D Left BKA   . INSERTION OF DIALYSIS CATHETER Right 04/19/2014   Procedure: INSERTION OF DIALYSIS CATHETER-INTERNAL JUGULAR;  Surgeon: Mal Misty, MD;  Location: [place];  Service: Vascular;  Laterality: Right;  . INSERTION OF DIALYSIS CATHETER N/A 11/06/2014   Procedure: INSERTION OF 23cm DIALYSIS CATHETER - right internal jugular;  Surgeon: Mal Misty, MD;  Location: [place];  Service: Vascular;  Laterality: N/A;  . KNEE CARTILAGE SURGERY Left 1964  . LIGATION OF ARTERIOVENOUS  FISTULA Right 11/06/2014   Procedure: LIGATION OF RADIOCEPHALIC ARTERIOVENOUS FISTULA;  Surgeon: Mal Misty, MD;  Location: [place];  Service: Vascular;  Laterality: Right;  . LIGATION OF COMPETING BRANCHES OF ARTERIOVENOUS FISTULA Right 06/19/2014   Procedure: Right Arm LIGATION OF COMPETING BRANCHES OF RADIOCEPHALIC ARTERIOVENOUS FISTULA;  Surgeon: Mal Misty, MD;  Location: [place];  Service: Vascular;  Laterality: Right;  . PROSTATECTOMY  2009       Home  Medications    Prior to Admission medications   Medication Sig Start Date End Date Taking? Authorizing Provider  amLODipine (NORVASC) 10 MG tablet Take one tablet by mouth once daily 04/05/16   Estill Dooms, MD  ASPIRIN LOW DOSE 81 MG EC tablet TAKE 1 TABLET BY MOUTH EVERY MORNING 10/03/15   Tiffany L Reed, DO  calcium acetate (PHOSLO) 667 MG capsule TAKE 1 CAPSULE BY MOUTH THREE TIMES DAILY WITH MEALS 08/27/15   Tiffany L Reed, DO  calcium acetate (PHOSLO) 667 MG capsule APPOINTMENT OVERDUE, Take 1 capsule by mouth three times daily with meals 02/24/16   Tiffany L Reed, DO  clopidogrel (PLAVIX) 75 MG tablet Take one tablet by mouth once daily 04/05/16   Estill Dooms, MD  EASY TOUCH PEN NEEDLES 31G X 5 MM MISC 3 (three) times daily. as directed 06/17/15   Historical Provider, MD  ethyl chloride spray See admin instructions. 07/16/15   Historical Provider, MD    HYDROcodone-acetaminophen (NORCO/VICODIN) 5-325 MG tablet Take 1 tablet by mouth every 6 (six) hours as needed for moderate pain. 01/09/16   Tiffany L Reed, DO  insulin aspart (NOVOLOG) 100 UNIT/ML FlexPen Inject 5 Units into the skin 3 (three) times daily with meals. Inject 5 units three times a day with meals if blood sugar greater than 150 only, DX: 250.71 04/05/16   Estill Dooms, MD  Insulin Detemir (LEVEMIR FLEXTOUCH) 100 UNIT/ML Pen INJECT 17 UNITS SUBCUTANEOUSLY TWICE DAILY 04/05/16   Estill Dooms, MD  ONE TOUCH ULTRA TEST test strip USE TO TEST BLOOD SUGAR THREE TIMES A DAY TO KEEP BLOOD SUGARS UNDER CONTROL 06/19/15   Historical Provider, MD  polyethylene glycol (MIRALAX) packet Take 17 g by mouth daily. 04/05/16   Estill Dooms, MD  pravastatin (PRAVACHOL) 40 MG tablet Take one tablet by mouth once daily 04/05/16   Estill Dooms, MD  pregabalin (LYRICA) 75 MG capsule Take one tablet by mouth once daily for pains 04/05/16   Estill Dooms, MD  QUEtiapine (SEROQUEL) 25 MG tablet Take 1 tablet (25 mg total) by mouth daily. 04/05/16   Estill Dooms, MD  tamsulosin [facility]) 0.4 MG CAPS capsule Take one capsule by mouth once daily 04/05/16   Estill Dooms, MD    Family History Family History  Problem Relation Age of Onset  . Hyperlipidemia Mother   . Hypertension Mother   . Cancer Mother   . Hyperlipidemia Father   . Hypertension Father   . Kidney disease Father   . Heart disease Sister   . Alzheimer's disease Sister   . Cancer Brother   . Other Sister   . Coronary artery disease Neg Hx   . Anesthesia problems Neg Hx   . Hypotension Neg Hx   . Malignant hyperthermia Neg Hx   . Pseudochol deficiency Neg Hx     Social History Social History  Substance Use Topics  . Smoking status: Former Smoker    Packs/day: 1.00    Years: 30.00    Types: Cigarettes    Quit date: 04/28/1991  . Smokeless tobacco: Never Used  . Alcohol use Yes     Comment: 04/17/2014 "might have a glass of wine  at Christmas"     Allergies   Oxycodone   Review of Systems Review of Systems  Constitutional: Negative.   HENT: Negative.   Respiratory: Positive for shortness of breath.   Cardiovascular: Negative.   Gastrointestinal: Negative.   Musculoskeletal: Negative.  Chronic right hand pain. Bilateral AKA  Skin: Negative.   Allergic/Immunologic: Positive for immunocompromised state.       Hemodialysis patient, diabetic  Neurological: Negative.   Psychiatric/Behavioral: Negative.   All other systems reviewed and are negative.    Physical Exam Updated Vital Signs BP (!) 194/91   Pulse (!) 101   Temp 98.9 F (37.2 C) (Oral)   Resp (!) 25   SpO2 95%   Physical Exam  Constitutional: No distress.  Chronically ill-appearing  HENT:  Head: Normocephalic and atraumatic.  Eyes: Conjunctivae are normal. Pupils are equal, round, and reactive to light.  Neck: Neck supple. JVD present. No tracheal deviation present. No thyromegaly present.  Cardiovascular: Normal rate and regular rhythm.   No murmur heard. Pulmonary/Chest: Effort normal and breath sounds normal.  Abdominal: Soft. Bowel sounds are normal. He exhibits no distension. There is no tenderness.  Musculoskeletal: Normal range of motion. He exhibits no edema or tenderness.  Bilateral lower extremities with above the knee amputations. Left upper extremity with dialysis fistula with good thrill right upper extremity nontender. No redness  Neurological: He is alert. Coordination normal.  Skin: Skin is warm and dry. No rash noted.  Psychiatric: He has a normal mood and affect.  Nursing note and vitals reviewed.    ED Treatments / Results  Labs (all labs ordered are listed, but only abnormal results are displayed) Labs Reviewed  BASIC METABOLIC PANEL  CBC WITH DIFFERENTIAL/PLATELET  Randolm Idol, ED    EKG  EKG Interpretation  Date/Time:  Friday April 09 2016 08:21:50 EDT Ventricular Rate:  112 PR  Interval:    QRS Duration: 112 QT Interval:  355 QTC Calculation: 485 R Axis:   -14 Text Interpretation:  Sinus tachycardia Incomplete left bundle branch block LVH with secondary repolarization abnormality Anterior Q waves, possibly due to LVH Confirmed by Winfred Leeds  MD, Harmani Neto [phone]) on 04/09/2016 9:04:49 AM      Results for orders placed or performed during the hospital encounter of 54/27/06  Basic metabolic panel  Result Value Ref Range   Sodium 134 (L) 135 - 145 mmol/L   Potassium 4.0 3.5 - 5.1 mmol/L   Chloride 95 (L) 101 - 111 mmol/L   CO2 25 22 - 32 mmol/L   Glucose, Bld 365 (H) 65 - 99 mg/dL   BUN 19 6 - 20 mg/dL   Creatinine, Ser 5.11 (H) 0.61 - 1.24 mg/dL   Calcium 10.3 8.9 - 10.3 mg/dL   GFR calc non Af Amer 10 (L) >60 mL/min   GFR calc Af Amer 11 (L) >60 mL/min   Anion gap 14 5 - 15  CBC with Differential/Platelet  Result Value Ref Range   WBC 12.4 (H) 4.0 - 10.5 K/uL   RBC 3.63 (L) 4.22 - 5.81 MIL/uL   Hemoglobin 11.4 (L) 13.0 - 17.0 g/dL   HCT 34.2 (L) 39.0 - 52.0 %   MCV 94.2 78.0 - 100.0 fL   MCH 31.4 26.0 - 34.0 pg   MCHC 33.3 30.0 - 36.0 g/dL   RDW 14.5 11.5 - 15.5 %   Platelets 205 150 - 400 K/uL   Neutrophils Relative % 80 %   Lymphocytes Relative 13 %   Monocytes Relative 6 %   Eosinophils Relative 1 %   Basophils Relative 0 %   Neutro Abs 10.0 (H) 1.7 - 7.7 K/uL   Lymphs Abs 1.6 0.7 - 4.0 K/uL   Monocytes Absolute 0.7 0.1 - 1.0 K/uL   Eosinophils  Absolute 0.1 0.0 - 0.7 K/uL   Basophils Absolute 0.0 0.0 - 0.1 K/uL   RBC Morphology MORPHOLOGY UNREMARKABLE   I-Stat Troponin, ED (not at [facility])  Result Value Ref Range   Troponin i, poc 1.85 (HH) 0.00 - 0.08 ng/mL   Comment NOTIFIED PHYSICIAN    Comment 3           Dg Chest Port 1 View  Result Date: 04/09/2016 CLINICAL DATA:  Shortness of Breath EXAM: PORTABLE CHEST 1 VIEW COMPARISON:  11/06/2014 FINDINGS: Interval removal of right dialysis catheter. Prior CABG. Heart is borderline in size. There is  diffuse interstitial prominence and vascular congestion. This may reflect mild interstitial edema. No effusions. No acute bony abnormality. IMPRESSION: Vascular congestion with increasing interstitial prominence, possibly interstitial edema. Electronically Signed   By: Rolm Baptise M.D.   On: 04/09/2016 09:19    Radiology No results found.  Procedures Procedures (including critical care time)  Medications Ordered in ED Medications - No data to display   Initial Impression / Assessment and Plan / ED Course  I have reviewed the triage vital signs and the nursing notes.  Pertinent labs & imaging results that were available during my care of the patient were reviewed by me and considered in my medical decision making (see chart for details).     Nursing unable to establish IV access. Angiocath insertion Performed by: Orlie Dakin  Consent: Verbal consent obtained. Risks and benefits: risks, benefits and alternatives were discussed Time out: Immediately prior to procedure a "time out" was called to verify the correct patient, procedure, equipment, support staff and site/side marked as required.  Preparation: Patient was prepped and draped in the usual sterile fashion.  Vein Location: right external jugular    Gauge: 20  Normal blood return and flush without difficulty Patient tolerance: Patient tolerated the procedure well with no immediate complications.  clinically patient exhibiting mild signs of congestive heart failure given neck vein distention and dyspnea. Chest x-ray corroborates CHF Chest x-ray viewed by me. Concern forNSTEMI given EKG changes and elevated troponin. Patient is anemic however hemoglobin markedly improved over 11 months ago Aspirin administered and topical nitrates applied Cardiolgy service consulted and will see patient in the hospital. I've also consulted the nephrology service who will arrange for hemodialysis later today. I've also consulted hospital  service who will arrange for admission.  Final Clinical Impressions(s) / ED Diagnoses  Diagnosis #1 acute dyspnea #2 hyperglycemia Final diagnoses:  None   #3 anemia CRITICAL CARE Performed by: Orlie Dakin Total critical care time: 30 minutes Critical care time was exclusive of separately billable procedures and treating other patients. Critical care was necessary to treat or prevent imminent or life-threatening deterioration. Critical care was time spent personally by me on the following activities: development of treatment plan with patient and/or surrogate as well as nursing, discussions with consultants, evaluation of patient's response to treatment, examination of patient, obtaining history from patient or surrogate, ordering and performing treatments and interventions, ordering and review of laboratory studies, ordering and review of radiographic studies, pulse oximetry and re-evaluation of patient's condition. New Prescriptions New Prescriptions   No medications on file     Orlie Dakin, MD 04/09/16 1019

## 2016-04-10 ENCOUNTER — Observation Stay (HOSPITAL_COMMUNITY): Payer: Medicare Other

## 2016-04-10 DIAGNOSIS — N2581 Secondary hyperparathyroidism of renal origin: Secondary | ICD-10-CM | POA: Diagnosis not present

## 2016-04-10 DIAGNOSIS — I10 Essential (primary) hypertension: Secondary | ICD-10-CM | POA: Diagnosis not present

## 2016-04-10 DIAGNOSIS — R748 Abnormal levels of other serum enzymes: Secondary | ICD-10-CM | POA: Diagnosis not present

## 2016-04-10 DIAGNOSIS — I509 Heart failure, unspecified: Secondary | ICD-10-CM | POA: Diagnosis not present

## 2016-04-10 DIAGNOSIS — F319 Bipolar disorder, unspecified: Secondary | ICD-10-CM | POA: Diagnosis present

## 2016-04-10 DIAGNOSIS — Z951 Presence of aortocoronary bypass graft: Secondary | ICD-10-CM | POA: Diagnosis not present

## 2016-04-10 DIAGNOSIS — E785 Hyperlipidemia, unspecified: Secondary | ICD-10-CM | POA: Diagnosis present

## 2016-04-10 DIAGNOSIS — J810 Acute pulmonary edema: Secondary | ICD-10-CM | POA: Diagnosis not present

## 2016-04-10 DIAGNOSIS — I25110 Atherosclerotic heart disease of native coronary artery with unstable angina pectoris: Secondary | ICD-10-CM | POA: Diagnosis not present

## 2016-04-10 DIAGNOSIS — E8770 Fluid overload, unspecified: Secondary | ICD-10-CM | POA: Diagnosis not present

## 2016-04-10 DIAGNOSIS — R0600 Dyspnea, unspecified: Secondary | ICD-10-CM | POA: Diagnosis not present

## 2016-04-10 DIAGNOSIS — I959 Hypotension, unspecified: Secondary | ICD-10-CM | POA: Diagnosis present

## 2016-04-10 DIAGNOSIS — I447 Left bundle-branch block, unspecified: Secondary | ICD-10-CM | POA: Diagnosis present

## 2016-04-10 DIAGNOSIS — Z89512 Acquired absence of left leg below knee: Secondary | ICD-10-CM | POA: Diagnosis not present

## 2016-04-10 DIAGNOSIS — E1151 Type 2 diabetes mellitus with diabetic peripheral angiopathy without gangrene: Secondary | ICD-10-CM | POA: Diagnosis not present

## 2016-04-10 DIAGNOSIS — G8929 Other chronic pain: Secondary | ICD-10-CM | POA: Diagnosis present

## 2016-04-10 DIAGNOSIS — R778 Other specified abnormalities of plasma proteins: Secondary | ICD-10-CM | POA: Diagnosis present

## 2016-04-10 DIAGNOSIS — R7989 Other specified abnormal findings of blood chemistry: Secondary | ICD-10-CM

## 2016-04-10 DIAGNOSIS — J9601 Acute respiratory failure with hypoxia: Secondary | ICD-10-CM | POA: Diagnosis present

## 2016-04-10 DIAGNOSIS — Z992 Dependence on renal dialysis: Secondary | ICD-10-CM

## 2016-04-10 DIAGNOSIS — D631 Anemia in chronic kidney disease: Secondary | ICD-10-CM | POA: Diagnosis not present

## 2016-04-10 DIAGNOSIS — I2510 Atherosclerotic heart disease of native coronary artery without angina pectoris: Secondary | ICD-10-CM | POA: Diagnosis not present

## 2016-04-10 DIAGNOSIS — I5043 Acute on chronic combined systolic (congestive) and diastolic (congestive) heart failure: Secondary | ICD-10-CM | POA: Diagnosis not present

## 2016-04-10 DIAGNOSIS — E1122 Type 2 diabetes mellitus with diabetic chronic kidney disease: Secondary | ICD-10-CM | POA: Diagnosis not present

## 2016-04-10 DIAGNOSIS — K5909 Other constipation: Secondary | ICD-10-CM | POA: Diagnosis present

## 2016-04-10 DIAGNOSIS — I5033 Acute on chronic diastolic (congestive) heart failure: Secondary | ICD-10-CM | POA: Diagnosis not present

## 2016-04-10 DIAGNOSIS — Z89511 Acquired absence of right leg below knee: Secondary | ICD-10-CM | POA: Diagnosis not present

## 2016-04-10 DIAGNOSIS — I252 Old myocardial infarction: Secondary | ICD-10-CM | POA: Diagnosis not present

## 2016-04-10 DIAGNOSIS — D638 Anemia in other chronic diseases classified elsewhere: Secondary | ICD-10-CM | POA: Diagnosis present

## 2016-04-10 DIAGNOSIS — I120 Hypertensive chronic kidney disease with stage 5 chronic kidney disease or end stage renal disease: Secondary | ICD-10-CM | POA: Diagnosis not present

## 2016-04-10 DIAGNOSIS — E1142 Type 2 diabetes mellitus with diabetic polyneuropathy: Secondary | ICD-10-CM | POA: Diagnosis present

## 2016-04-10 DIAGNOSIS — I361 Nonrheumatic tricuspid (valve) insufficiency: Secondary | ICD-10-CM | POA: Diagnosis not present

## 2016-04-10 DIAGNOSIS — J811 Chronic pulmonary edema: Secondary | ICD-10-CM

## 2016-04-10 DIAGNOSIS — E1165 Type 2 diabetes mellitus with hyperglycemia: Secondary | ICD-10-CM | POA: Diagnosis present

## 2016-04-10 DIAGNOSIS — E1129 Type 2 diabetes mellitus with other diabetic kidney complication: Secondary | ICD-10-CM | POA: Diagnosis not present

## 2016-04-10 DIAGNOSIS — N186 End stage renal disease: Secondary | ICD-10-CM | POA: Diagnosis not present

## 2016-04-10 DIAGNOSIS — I214 Non-ST elevation (NSTEMI) myocardial infarction: Secondary | ICD-10-CM | POA: Diagnosis not present

## 2016-04-10 DIAGNOSIS — N400 Enlarged prostate without lower urinary tract symptoms: Secondary | ICD-10-CM | POA: Diagnosis present

## 2016-04-10 DIAGNOSIS — I132 Hypertensive heart and chronic kidney disease with heart failure and with stage 5 chronic kidney disease, or end stage renal disease: Secondary | ICD-10-CM | POA: Diagnosis present

## 2016-04-10 LAB — BASIC METABOLIC PANEL
Anion gap: 13 (ref 5–15)
BUN: 13 mg/dL (ref 6–20)
CALCIUM: 9.4 mg/dL (ref 8.9–10.3)
CO2: 29 mmol/L (ref 22–32)
CREATININE: 4 mg/dL — AB (ref 0.61–1.24)
Chloride: 92 mmol/L — ABNORMAL LOW (ref 101–111)
GFR, EST AFRICAN AMERICAN: 15 mL/min — AB (ref >60)
GFR, EST NON AFRICAN AMERICAN: 13 mL/min — AB (ref >60)
Glucose, Bld: 174 mg/dL — ABNORMAL HIGH (ref 65–99)
Potassium: 3.4 mmol/L — ABNORMAL LOW (ref 3.5–5.1)
SODIUM: 134 mmol/L — AB (ref 135–145)

## 2016-04-10 LAB — GLUCOSE, CAPILLARY
GLUCOSE-CAPILLARY: 216 mg/dL — AB (ref 65–99)
GLUCOSE-CAPILLARY: 326 mg/dL — AB (ref 65–99)
GLUCOSE-CAPILLARY: 85 mg/dL (ref 65–99)
GLUCOSE-CAPILLARY: 85 mg/dL (ref 65–99)
Glucose-Capillary: 165 mg/dL — ABNORMAL HIGH (ref 65–99)

## 2016-04-10 LAB — TROPONIN I
Troponin I: 4.26 ng/mL (ref <0.03)
Troponin I: 3.56 ng/mL (ref <0.03)
Troponin I: 2.46 ng/mL (ref <0.03)

## 2016-04-10 LAB — HEPARIN LEVEL (UNFRACTIONATED): HEPARIN UNFRACTIONATED: 0.23 [IU]/mL — AB (ref 0.30–0.70)

## 2016-04-10 LAB — CBC
HCT: 32.2 % — ABNORMAL LOW (ref 39.0–52.0)
Hemoglobin: 10.4 g/dL — ABNORMAL LOW (ref 13.0–17.0)
MCH: 30.9 pg (ref 26.0–34.0)
MCHC: 32.3 g/dL (ref 30.0–36.0)
MCV: 95.5 fL (ref 78.0–100.0)
PLATELETS: 224 10*3/uL (ref 150–400)
RBC: 3.37 MIL/uL — AB (ref 4.22–5.81)
RDW: 14.8 % (ref 11.5–15.5)
WBC: 10.3 10*3/uL (ref 4.0–10.5)

## 2016-04-10 MED ORDER — CARVEDILOL 3.125 MG PO TABS
3.1250 mg | ORAL_TABLET | Freq: Two times a day (BID) | ORAL | Status: DC
  Administered 2016-04-10 – 2016-04-13 (×6): 3.125 mg via ORAL
  Filled 2016-04-10 (×6): qty 1

## 2016-04-10 MED ORDER — AMLODIPINE BESYLATE 5 MG PO TABS
5.0000 mg | ORAL_TABLET | Freq: Every day | ORAL | Status: DC
  Administered 2016-04-11 – 2016-04-13 (×3): 5 mg via ORAL
  Filled 2016-04-10 (×3): qty 1

## 2016-04-10 NOTE — Progress Notes (Signed)
ANTICOAGULATION CONSULT NOTE - Follow Up Consult  Pharmacy Consult for Heparin Indication: chest pain/ACS  Allergies  Allergen Reactions  . Oxycodone Other (See Comments)    Hallucinations    Patient Measurements: Weight: 158 lb 11.7 oz (72 kg)  Vital Signs: Temp: 97.9 F (36.6 C) (03/31 0103) Temp Source: Oral (03/31 0103) BP: 122/62 (03/31 0103) Pulse Rate: 86 (03/31 0103)  Labs:  Recent Labs  04/09/16 0903 04/09/16 1214 04/09/16 1909 04/10/16 0341  HGB 11.4* 10.7*  --  10.4*  HCT 34.2* 33.1*  --  32.2*  PLT 205 219  --  224  HEPARINUNFRC  --   --  0.27* 0.23*  CREATININE 5.11* 5.19*  --   --     Estimated Creatinine Clearance: 11.7 mL/min (A) (by C-G formula based on SCr of 5.19 mg/dL (H)).  Assessment: 76 y.o. male with h/o ESRD and CHF, elevated cardiac markers, for heparin  Goal of Therapy:  Heparin level 0.3-0.7 units/ml Monitor platelets by anticoagulation protocol: Yes   Plan:  Increase Heparin 1200 units/hr  Caryl Pina 04/10/2016,4:24 AM

## 2016-04-10 NOTE — Progress Notes (Addendum)
[facility]                                                                              Patient Demographics  Aaron Long, is a 76 y.o. male, DOB - [birth year], GEX:[id number]  Admit date - 04/09/2016   Admitting Physician Elwin Mocha, MD  Outpatient Primary MD for the patient is Pcp Not In System  Outpatient specialists:   LOS - 0  days    Chief Complaint  Patient presents with  . Shortness of Breath       Brief summary   Patient is a 76 year old male with BPH, anemia, ESRD on HD MWF, diabetes, history of venous thromboembolism, CAD who presented with shortness of breath. Patient reported that he has not missed any dialysis. However per day before and the admission he started having shortness of breath otherwise no chest pain, nausea, vomiting or any diaphoresis.  ED course: Chest x-ray showed interstitial edema pulmonary and patient was given Nitropaste. Nephrology and cardiology were consulted. Patient had one set of troponin point of care +1.85, was started on heparin.   Assessment & Plan    Principal Problem:  Acute diastolic CHF (congestive heart failure) (HCC)/Volume overload with interstitial pulmonary edema and in the setting of ESRD on hemodialysis - 2-D echo in 09/2014 had shown EF of 50-55% with grade 1 diastolic dysfunction - Chest x-ray had shown interstitial pulmonary edema, troponin point of care 8, patient was started on heparin drip. Per admission note, cardiology and nephrology was consulted. - Patient was started on heparin drip. Underwent urgent hemodialysis, today feeling much improved - No further troponins, will order serial troponins and repeat 2-D echocardiogram   Active Problems: Elevated troponin - Possibly due to volume overload and acute pulmonary edema. However patient has underlying history of CAD - Only one troponin POC +1.85, obtain 3 sets of troponin, repeat 2-D echo - Currently no chest pain, patient was started on  heparin drip and cardiology was consulted - Continue Plavix, statin - Repeat troponin 4.26, discussed with Dr. Debara Pickett, cardiology  ESRD on hemodialysis [facility]) MWF - Nephrology was consulted, chest x-ray showed interstitial pulmonary edema - Patient underwent hemodialysis per schedule and per nephrology, extra short HD again today, lower dry weight     HTN (hypertension) - BP currently stable, continue HD and continue amlodipine    Diabetes mellitus with peripheral vascular disease ([place]) - CBGs this morning stable, continue Levemir, NovoLog  Code Status: Full CODE STATUS DVT Prophylaxis:   heparin drip Family Communication: Discussed in detail with the patient, all imaging results, lab results explained to the patient   Disposition Plan:   Time Spent in minutes  25 minutes  Procedures:    Consultants:   Nephrology Cardiology  Antimicrobials:   None   Medications  Scheduled Meds: . amLODipine  10 mg Oral Daily  . clopidogrel  75 mg Oral Daily  . [START ON 04/12/2016] doxercalciferol  3 mcg Intravenous Q M,W,F-HD  . ferric citrate  420 mg Oral TID  . [START ON 04/14/2016] ferric gluconate (FERRLECIT/NULECIT) IV  62.5 mg Intravenous Q Wed-HD  . insulin aspart  0-20  Units Subcutaneous TID WC  . insulin detemir  17 Units Subcutaneous BID  . nitroGLYCERIN  1 inch Topical Q6H  . polyethylene glycol  17 g Oral Daily  . pravastatin  40 mg Oral Daily  . pregabalin  75 mg Oral Daily  . QUEtiapine  25 mg Oral Daily  . tamsulosin  0.4 mg Oral Daily   Continuous Infusions: . heparin 1,200 Units/hr (04/10/16 0841)   PRN Meds:.acetaminophen **OR** acetaminophen, HYDROcodone-acetaminophen, ondansetron **OR** ondansetron (ZOFRAN) IV   Antibiotics   Anti-infectives    None        Subjective:   Aaron Long was seen and examined today.  Currently feels significantly improved from yesterday, no chest pain or shortness of breath at this time. Even feels ready to go home and  watches football game.  Patient denies dizziness, abdominal pain, N/V/D/C, new weakness, numbess, tingling. No acute events overnight.    Objective:   Vitals:   04/09/16 1815 04/09/16 2032 04/10/16 0103 04/10/16 0556  BP: (!) 164/79 (!) 91/50 122/62 (!) 150/71  Pulse: 90 80 86 79  Resp:  18 18 18   Temp:  98.3 F (36.8 C) 97.9 F (36.6 C) 98 F (36.7 C)  TempSrc:  Oral Oral Oral  SpO2:  100% 98% 100%  Weight:    70.3 kg (154 lb 15.7 oz)    Intake/Output Summary (Last 24 hours) at 04/10/16 0949 Last data filed at 04/10/16 0900  Gross per 24 hour  Intake           565.53 ml  Output             4000 ml  Net         -3434.47 ml     Wt Readings from Last 3 Encounters:  04/10/16 70.3 kg (154 lb 15.7 oz)  10/30/15 77.1 kg (170 lb)  05/05/15 77.3 kg (170 lb 6.7 oz)     Exam  General: Alert and oriented x 3, NAD   HEENT:    Neck: Supple, no JVD, no masses  Cardiovascular: S1 S2 auscultated, no rubs, murmurs or gallops. Regular rate and rhythm.  Respiratory: Clear to auscultation bilaterally, no wheezing, rales or rhonchi  Gastrointestinal: Soft, nontender, nondistended, + bowel sounds  Ext: no cyanosis clubbing, bilateral AKA   Neuro: nonfocal  Skin: No rashes  Psych: Normal affect and demeanor, alert and oriented x3    Data Reviewed:  I have personally reviewed following labs and imaging studies  Micro Results No results found for this or any previous visit (from the past 240 hour(s)).  Radiology Reports Portable Chest 1 View  Result Date: 04/10/2016 CLINICAL DATA:  Pulmonary edema. EXAM: PORTABLE CHEST 1 VIEW COMPARISON:  April 09, 2016 FINDINGS: Stable mild cardiomegaly. The hila and mediastinum are unchanged. No pneumothorax. Diffuse interstitial opacities seen previously have improved. More focal opacities are seen in the bases, right greater than left. There is a platelike appearance of these opacities on the left, but not on the right. IMPRESSION: 1.  Improved pulmonary edema. 2. More focal opacities are seen in the bases, possibly representing developing infiltrate on the right and atelectasis on the left. Recommend clinical correlation and follow-up to resolution. Electronically Signed   By: Dorise Bullion III M.D   On: 04/10/2016 07:28   Dg Chest Port 1 View  Result Date: 04/09/2016 CLINICAL DATA:  Shortness of Breath EXAM: PORTABLE CHEST 1 VIEW COMPARISON:  11/06/2014 FINDINGS: Interval removal of right dialysis catheter. Prior CABG. Heart is borderline  in size. There is diffuse interstitial prominence and vascular congestion. This may reflect mild interstitial edema. No effusions. No acute bony abnormality. IMPRESSION: Vascular congestion with increasing interstitial prominence, possibly interstitial edema. Electronically Signed   By: Rolm Baptise M.D.   On: 04/09/2016 09:19    Lab Data:  CBC:  Recent Labs Lab 04/09/16 0903 04/09/16 1214 04/10/16 0341  WBC 12.4* 11.4* 10.3  NEUTROABS 10.0*  --   --   HGB 11.4* 10.7* 10.4*  HCT 34.2* 33.1* 32.2*  MCV 94.2 95.7 95.5  PLT 205 219 703   Basic Metabolic Panel:  Recent Labs Lab 04/09/16 0903 04/09/16 1214 04/10/16 0341  NA 134* 134* 134*  K 4.0 3.7 3.4*  CL 95* 96* 92*  CO2 25 26 29   GLUCOSE 365* 337* 174*  BUN 19 20 13   CREATININE 5.11* 5.19* 4.00*  CALCIUM 10.3 9.9 9.4  PHOS  --  3.9  --    GFR: Estimated Creatinine Clearance: 15.2 mL/min (A) (by C-G formula based on SCr of 4 mg/dL (H)). Liver Function Tests:  Recent Labs Lab 04/09/16 1214  ALBUMIN 3.5   No results for input(s): LIPASE, AMYLASE in the last 168 hours. No results for input(s): AMMONIA in the last 168 hours. Coagulation Profile: No results for input(s): INR, PROTIME in the last 168 hours. Cardiac Enzymes: No results for input(s): CKTOTAL, CKMB, CKMBINDEX, TROPONINI in the last 168 hours. BNP (last 3 results) No results for input(s): PROBNP in the last 8760 hours. HbA1C: No results for  input(s): HGBA1C in the last 72 hours. CBG:  Recent Labs Lab 04/09/16 1149 04/09/16 1749 04/09/16 2217 04/10/16 0744  GLUCAP 346* 209* 151* 85   Lipid Profile: No results for input(s): CHOL, HDL, LDLCALC, TRIG, CHOLHDL, LDLDIRECT in the last 72 hours. Thyroid Function Tests: No results for input(s): TSH, T4TOTAL, FREET4, T3FREE, THYROIDAB in the last 72 hours. Anemia Panel: No results for input(s): VITAMINB12, FOLATE, FERRITIN, TIBC, IRON, RETICCTPCT in the last 72 hours. Urine analysis:    Component Value Date/Time   COLORURINE YELLOW 10/15/2014 1344   APPEARANCEUR CLEAR 10/15/2014 1344   APPEARANCEUR Turbid (A) 07/12/2014 1644   LABSPEC 1.017 10/15/2014 1344   PHURINE 6.0 10/15/2014 1344   GLUCOSEU >1000 (A) 10/15/2014 1344   HGBUR SMALL (A) 10/15/2014 1344   BILIRUBINUR NEGATIVE 10/15/2014 1344   BILIRUBINUR Negative 07/12/2014 1644   KETONESUR NEGATIVE 10/15/2014 1344   PROTEINUR >300 (A) 10/15/2014 1344   UROBILINOGEN 0.2 10/15/2014 1344   NITRITE NEGATIVE 10/15/2014 1344   LEUKOCYTESUR NEGATIVE 10/15/2014 1344   LEUKOCYTESUR 3+ (A) 07/12/2014 1644     Jaryn Hocutt M.D. [facility] 04/10/2016, 9:49 AM  Pager: [phone] Between 7am to 7pm - call Pager - [phone]  After 7pm go to [email or web address] - password [email or web address]  Call night coverage person covering after 7pm

## 2016-04-10 NOTE — Progress Notes (Signed)
Reason for Consult: Acute heart for exacerbation, abnormal cardiac enzymes  Requesting Physician: Rai  Cardiologist: Angelena Form   HPI: This is a 76 y.o. male with a past medical history significant for coronary artery disease with multivessel bypass surgery in 2007, diabetes mellitus, hypertension, peripheral arterial disease, end-stage renal disease on hemodialysis.   He presented with complaints of severe shortness of breath despite not missing dialysis sessions. He reports having complete and uncomplicated dialysis earlier on Friday. He has not had angina pectoris, nausea or vomiting, diaphoresis, palpitations or syncope. He did have angina before this CABG. His chest x-ray showed interstitial pulmonary edema. He has abnormal troponin level at 4.26. His ECG shows sinus rhythm with an incomplete left bundle branch block. There appeared to be new ST segment changes with marked depression in the lateral leads (however the conduction abnormality is also more prominent than in the past).  After hemodialysis and treatment with nitrates and heparin he felt greatly improved. An echocardiogram has been ordered but not yet performed. He is currently in hemodialysis and that it's unlikely that the echo will be done today.  Most recent coronary evaluation was before carotid endarterectomy performed in September 2016. a nuclear stress test showed intermediate risk (medium size, partially reversible inferolateral perfusion defect, EF 30%. This was followed by cardiac catheterization which showed severe native three-vessel disease but patency of all bypass grafts (80% proximal LAD, 100% proximal circumflex, 95% mid RCA, patent LIMA to LAD, SVG to diagonal, patent SVG to OM, patent SVG to PDA). By left ventriculography left ventricular systolic function was much better than estimated by the nuclear stress test, with EF 50-55 percent and basal inferior hypokinesis. He has not seen Dr. Angelena Form since 2016.  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 He has a left upper extremity AV fistula.  PMHx:  Past Medical History:  Diagnosis Date  . Abnormality of gait   . Anemia   . BPH (benign prostatic hypertrophy)   . Complication of anesthesia    "he gets delirious"  . Constipation    takes Miralax daily  . Coronary artery disease    a. s/p NSTEMI/CABGx4 in 2007 - LIMA-LAD, SVG-optional diagonal, SVVG-OM, SVG-dRCA. b. Nuc 05/2011: nonischemic.  . Diabetic peripheral neuropathy ([place])   . Dieulafoy lesion of rectum 05/03/2015  . Disorder of bone and cartilage, unspecified   . DVT (deep venous thrombosis) ([place])    a. Lower extremity DVT in 2013.  [name] ESRD on dialysis [facility])    M-W-F  . Gangrene of toe (HCC)    dry  . Hemorrhoids   . Hx of colonic polyps   . Hyperlipidemia   . Hypertension   . Hypertrophy of prostate without urinary obstruction and other lower urinary tract symptoms (LUTS)   . Ischemic cardiomyopathy    a. EF 40% in 2007. b. 51% by nuc 05/2011.  [name] Kidney stones   . Lower limb amputation, below knee   . Myocardial infarction 2005  . Neuromuscular disorder ([place])    diabetic neuropathy  . PAD (peripheral artery disease) ([place])    a. R CEA 2007. b. Hx L BKA in 2013. c. s/p LE angioplasty in 2014. d. Hx R BKA in 2014.  [name] Peripheral neuropathy ([place])   . Prostate cancer ([place]) 2010  . Secondary hyperparathyroidism ([place])    Secondary Hyperpara- Thyroidism, Renal  . Type  II diabetes mellitus ([place])    Past Surgical History:  Procedure Laterality Date  . ABDOMINAL AORTAGRAM N/A 05/28/2011   Procedure: ABDOMINAL Maxcine Ham;  Surgeon: Elam Dutch, MD;  Location: [facility] CATH LAB;  Service: Cardiovascular;  Laterality: N/A;  . ABDOMINAL AORTAGRAM N/A 06/02/2012   Procedure: ABDOMINAL Maxcine Ham;  Surgeon: Elam Dutch, MD;   Location: [facility] CATH LAB;  Service: Cardiovascular;  Laterality: N/A;  . ABDOMINAL AORTAGRAM N/A 06/09/2012   Procedure: ABDOMINAL Maxcine Ham;  Surgeon: Elam Dutch, MD;  Location: [facility] CATH LAB;  Service: Cardiovascular;  Laterality: N/A;  . AMPUTATION  06/14/2011   Procedure: AMPUTATION DIGIT;  Surgeon: Elam Dutch, MD;  Location: [facility] OR;  Service: Vascular;  Laterality: Left;  Amputation Left fifth toe  . AMPUTATION  06/16/2011   Procedure: AMPUTATION BELOW KNEE;  Surgeon: Elam Dutch, MD;  Location: [place];  Service: Vascular;  Laterality: Left;  . AMPUTATION Right 06/12/2012   Procedure: AMPUTATION DIGIT;  Surgeon: Elam Dutch, MD;  Location: [place];  Service: Vascular;  Laterality: Right;  GREAT TOE  . AMPUTATION Right 08/08/2012   Procedure: AMPUTATION BELOW KNEE;  Surgeon: Elam Dutch, MD;  Location: [place];  Service: Vascular;  Laterality: Right;  . AV FISTULA PLACEMENT Right 04/19/2014   Procedure: RIGHT ARM ARTERIOVENOUS (AV) FISTULA CREATION;  Surgeon: Mal Misty, MD;  Location: [place];  Service: Vascular;  Laterality: Right;  . AV FISTULA PLACEMENT Left 12/12/2014   Procedure: BRACHIOCEPHALIC ARTERIOVENOUS (AV) FISTULA CREATION;  Surgeon: Mal Misty, MD;  Location: [place];  Service: Vascular;  Laterality: Left;  . CARDIAC CATHETERIZATION  05/28/11  . CARDIAC CATHETERIZATION N/A 10/04/2014   Procedure: Left Heart Cath and Coronary Angiography;  Surgeon: Sherren Mocha, MD;  Location: [place] CV LAB;  Service: Cardiovascular;  Laterality: N/A;  . CAROTID ENDARTERECTOMY Right 2005  . CATARACT EXTRACTION W/ INTRAOCULAR LENS  IMPLANT, BILATERAL Bilateral 2011  . COLONOSCOPY    . COLONOSCOPY N/A 05/03/2015   Procedure: COLONOSCOPY;  Surgeon: Gatha Mayer, MD;  Location: [place];  Service: Endoscopy;  Laterality: N/A;  . CORONARY ARTERY BYPASS GRAFT  2005   CABG X4  . ENDARTERECTOMY Left 10/17/2014   Procedure: ENDARTERECTOMY CAROTID;  Surgeon: Mal Misty, MD;   Location: [place];  Service: Vascular;  Laterality: Left;  . I&D EXTREMITY  01/31/2012   Procedure: IRRIGATION AND DEBRIDEMENT EXTREMITY;  Surgeon: Elam Dutch, MD;  Location: [place];  Service: Vascular;  Laterality: Left;  I & D Left BKA   . INSERTION OF DIALYSIS CATHETER Right 04/19/2014   Procedure: INSERTION OF DIALYSIS CATHETER-INTERNAL JUGULAR;  Surgeon: Mal Misty, MD;  Location: [place];  Service: Vascular;  Laterality: Right;  . INSERTION OF DIALYSIS CATHETER N/A 11/06/2014   Procedure: INSERTION OF 23cm DIALYSIS CATHETER - right internal jugular;  Surgeon: Mal Misty, MD;  Location: [place];  Service: Vascular;  Laterality: N/A;  . KNEE CARTILAGE SURGERY Left 1964  . LIGATION OF ARTERIOVENOUS  FISTULA Right 11/06/2014   Procedure: LIGATION OF RADIOCEPHALIC ARTERIOVENOUS FISTULA;  Surgeon: Mal Misty, MD;  Location: [place];  Service: Vascular;  Laterality: Right;  . LIGATION OF COMPETING BRANCHES OF ARTERIOVENOUS FISTULA Right 06/19/2014   Procedure: Right Arm LIGATION OF COMPETING BRANCHES OF RADIOCEPHALIC ARTERIOVENOUS FISTULA;  Surgeon: Mal Misty, MD;  Location: [place];  Service: Vascular;  Laterality: Right;  . PROSTATECTOMY  2009    FAMHx: Family History  Problem Relation Age  of Onset  . Hyperlipidemia Mother   . Hypertension Mother   . Cancer Mother   . Hyperlipidemia Father   . Hypertension Father   . Kidney disease Father   . Heart disease Sister   . Alzheimer's disease Sister   . Cancer Brother   . Other Sister   . Coronary artery disease Neg Hx   . Anesthesia problems Neg Hx   . Hypotension Neg Hx   . Malignant hyperthermia Neg Hx   . Pseudochol deficiency Neg Hx     SOCHx:  reports that he quit smoking about 24 years ago. His smoking use included Cigarettes. He has a 30.00 pack-year smoking history. He has never used smokeless tobacco. He reports that he drinks alcohol. He reports that he uses drugs, including Marijuana.  ALLERGIES: Allergies    Allergen Reactions  . Oxycodone Other (See Comments)    Hallucinations    ROS: Pertinent items noted in HPI and remainder of comprehensive ROS otherwise negative.  HOME MEDICATIONS: Prescriptions Prior to Admission  Medication Sig Dispense Refill Last Dose  . amLODipine (NORVASC) 10 MG tablet Take one tablet by mouth once daily (Patient taking differently: Take 10 mg by mouth daily. ) 30 tablet 0 04/08/2016 at Unknown time  . ASPIRIN LOW DOSE 81 MG EC tablet TAKE 1 TABLET BY MOUTH EVERY MORNING 90 tablet 10 04/08/2016 at Unknown time  . AURYXIA 1 GM 210 MG(Fe) tablet Take 420 mg by mouth 3 (three) times daily.  0 04/08/2016 at Unknown time  . clopidogrel (PLAVIX) 75 MG tablet Take one tablet by mouth once daily (Patient taking differently: Take 75 mg by mouth daily. ) 30 tablet 0 04/08/2016 at Unknown time  . HYDROcodone-acetaminophen (NORCO/VICODIN) 5-325 MG tablet Take 1 tablet by mouth every 6 (six) hours as needed for moderate pain. 120 tablet 0 Past Week at Unknown time  . insulin aspart (NOVOLOG) 100 UNIT/ML FlexPen Inject 5 Units into the skin 3 (three) times daily with meals. Inject 5 units three times a day with meals if blood sugar greater than 150 only, DX: 250.71 15 mL 0 04/08/2016 at Unknown time  . Insulin Detemir (LEVEMIR FLEXTOUCH) 100 UNIT/ML Pen INJECT 17 UNITS SUBCUTANEOUSLY TWICE DAILY (Patient taking differently: Inject 17 Units into the skin 2 (two) times daily. ) 15 mL 0 04/08/2016 at Unknown time  . polyethylene glycol (MIRALAX) packet Take 17 g by mouth daily. 30 each 0 Past Week at Unknown time  . pravastatin (PRAVACHOL) 40 MG tablet Take one tablet by mouth once daily (Patient taking differently: Take 40 mg by mouth daily. ) 30 tablet 0 04/08/2016 at Unknown time  . pregabalin (LYRICA) 75 MG capsule Take one tablet by mouth once daily for pains (Patient taking differently: Take 75 mg by mouth daily. ) 30 capsule 0 04/08/2016 at Unknown time  . QUEtiapine (SEROQUEL) 25 MG  tablet Take 1 tablet (25 mg total) by mouth daily. 30 tablet 0 04/08/2016 at Unknown time  . tamsulosin (FLOMAX) 0.4 MG CAPS capsule Take one capsule by mouth once daily (Patient taking differently: Take 0.4 mg by mouth daily. ) 30 capsule 0 04/08/2016 at Unknown time  . calcium acetate (PHOSLO) 667 MG capsule TAKE 1 CAPSULE BY MOUTH THREE TIMES DAILY WITH MEALS (Patient not taking: Reported on 04/09/2016) 90 capsule 5 Not Taking at Unknown time    HOSPITAL MEDICATIONS: I have reviewed the patient's current medications.  VITALS: Blood pressure (!) 100/47, pulse 66, temperature 98 F (36.7 C), temperature  source Oral, resp. rate (!) 21, weight 72.1 kg (158 lb 15.2 oz), SpO2 100 %.  PHYSICAL EXAM:  General: Alert, oriented x3, no distress Head: no evidence of trauma, PERRL, EOMI, no exophtalmos or lid lag, no myxedema, no xanthelasma; normal ears, nose and oropharynx Neck: hard to see (scarred) jugular venous pulsations and no hepatojugular reflux; brisk carotid pulses without delay and no carotid bruits Chest: clear to auscultation, no signs of consolidation by percussion or palpation, normal fremitus, symmetrical and full respiratory excursions Cardiovascular: normal position and quality of the apical impulse, regular rhythm, normal first heart sound and normal second heart sound, no rubs or gallops, 4-5/8 early systolic murmur Abdomen: no tenderness or distention, no masses by palpation, no abnormal pulsatility or arterial bruits, normal bowel sounds, no hepatosplenomegaly Extremities: no clubbing, cyanosis;  no edema; 2+ radial, ulnar and brachial pulses bilaterally; B BKA Neurological: grossly nonfocal   LABS  CBC  Recent Labs  04/09/16 0903 04/09/16 1214 04/10/16 0341  WBC 12.4* 11.4* 10.3  NEUTROABS 10.0*  --   --   HGB 11.4* 10.7* 10.4*  HCT 34.2* 33.1* 32.2*  MCV 94.2 95.7 95.5  PLT 205 219 099   Basic Metabolic Panel  Recent Labs  04/09/16 1214 04/10/16 0341  NA  134* 134*  K 3.7 3.4*  CL 96* 92*  CO2 26 29  GLUCOSE 337* 174*  BUN 20 13  CREATININE 5.19* 4.00*  CALCIUM 9.9 9.4  PHOS 3.9  --    Liver Function Tests  Recent Labs  04/09/16 1214  ALBUMIN 3.5   No results for input(s): LIPASE, AMYLASE in the last 72 hours. Cardiac Enzymes  Recent Labs  04/10/16 1017  TROPONINI 4.26*     IMAGING: Portable Chest 1 View  Result Date: 04/10/2016 CLINICAL DATA:  Pulmonary edema. EXAM: PORTABLE CHEST 1 VIEW COMPARISON:  April 09, 2016 FINDINGS: Stable mild cardiomegaly. The hila and mediastinum are unchanged. No pneumothorax. Diffuse interstitial opacities seen previously have improved. More focal opacities are seen in the bases, right greater than left. There is a platelike appearance of these opacities on the left, but not on the right. IMPRESSION: 1. Improved pulmonary edema. 2. More focal opacities are seen in the bases, possibly representing developing infiltrate on the right and atelectasis on the left. Recommend clinical correlation and follow-up to resolution. Electronically Signed   By: Dorise Bullion III M.D   On: 04/10/2016 07:28   Dg Chest Port 1 View  Result Date: 04/09/2016 CLINICAL DATA:  Shortness of Breath EXAM: PORTABLE CHEST 1 VIEW COMPARISON:  11/06/2014 FINDINGS: Interval removal of right dialysis catheter. Prior CABG. Heart is borderline in size. There is diffuse interstitial prominence and vascular congestion. This may reflect mild interstitial edema. No effusions. No acute bony abnormality. IMPRESSION: Vascular congestion with increasing interstitial prominence, possibly interstitial edema. Electronically Signed   By: Rolm Baptise M.D.   On: 04/09/2016 09:19    ECG: Poor quality tracing due to baseline artifact, normal sinus rhythm, incomplete left bundle branch block, roughly 2 mm ST segment depression in the lateral leads, worse than on previous tracings  TELEMETRY:  sinus rhythm/mild sinus  tachycardia  IMPRESSION: 1. Acute (diastolic) heart failure - reevaluate EF and wall motion by echo. Since he had not missed dialysis or meds or had excessive volume intake, it is possible that acute worsening is due to new ischemic LV dysfunction. If the echo shows clear new abnormalities compatible with ischemia, best next step is angiography.  2. NSTEMI -  the increase in cardiac enzymes is mild and would not expect CHF exacerbation from that size infarction. More territory may be in jeopardy. Alternatively, this degree of enzyme release could be secondary to subendocardial demand ischemia from heart failure. 3. CAD s/p CABG - graft dependent 4. ESRD on HD -  Currently in HD and we will not get echo til AM. I asked the echo staff to do as early as possible in AM. 5. PAD s/p bilat BKA  RECOMMENDATION: 1. Continue IV heparin, ASA + clopidogrel 2. Review echo in AM to decide whether to proceed with invasive angiography. 3. Add low dose carvedilol (BP will not allow a lot, but this would be a more important med than amlodipine, which I will reduce).   Time Spent Directly with Patient: 45 minutes  Sanda Klein, MD, [facility] [phone] office [phone] pager   04/10/2016, 3:28 PM

## 2016-04-10 NOTE — Progress Notes (Signed)
Pt arrived to floor from HD at 1750.  Pt stable .  Karie Kirks, Therapist, sports.

## 2016-04-10 NOTE — Progress Notes (Signed)
Assessment/Plan 1. Volume overload /  interstitial pulmonary edema, improved with resid on CXR 2. ESRD -  NL MWF  HD; Extra short HD today 3. Hypertension/volume  - bp improved with dialysis 4. Pos Troponin  Subjective: Interval History: BP and SOB better with HD  Objective: Vital signs in last 24 hours: Temp:  [97.9 F (36.6 C)-99.4 F (37.4 C)] 98 F (36.7 C) (03/31 0556) Pulse Rate:  [63-97] 79 (03/31 0556) Resp:  [18-24] 18 (03/31 0556) BP: (89-190)/(50-107) 150/71 (03/31 0556) SpO2:  [97 %-100 %] 100 % (03/31 0556) Weight:  [70.3 kg (154 lb 15.7 oz)-76 kg (167 lb 8.8 oz)] 70.3 kg (154 lb 15.7 oz) (03/31 0556) Weight change:   Intake/Output from previous day: 03/30 0701 - 03/31 0700 In: 325.5 [P.O.:240; I.V.:85.5] Out: 4000  Intake/Output this shift: Total I/O In: 240 [P.O.:240] Out: -   General appearance: alert and cooperative Resp: clear to auscultation bilaterally Cardio: regular rate and rhythm, S1, S2 normal, no murmur, click, rub or gallop Extremities: bilat amp  Lab Results:  Recent Labs  04/09/16 1214 04/10/16 0341  WBC 11.4* 10.3  HGB 10.7* 10.4*  HCT 33.1* 32.2*  PLT 219 224   BMET:  Recent Labs  04/09/16 1214 04/10/16 0341  NA 134* 134*  K 3.7 3.4*  CL 96* 92*  CO2 26 29  GLUCOSE 337* 174*  BUN 20 13  CREATININE 5.19* 4.00*  CALCIUM 9.9 9.4   No results for input(s): PTH in the last 72 hours. Iron Studies: No results for input(s): IRON, TIBC, TRANSFERRIN, FERRITIN in the last 72 hours. Studies/Results: Portable Chest 1 View  Result Date: 04/10/2016 CLINICAL DATA:  Pulmonary edema. EXAM: PORTABLE CHEST 1 VIEW COMPARISON:  April 09, 2016 FINDINGS: Stable mild cardiomegaly. The hila and mediastinum are unchanged. No pneumothorax. Diffuse interstitial opacities seen previously have improved. More focal opacities are seen in the bases, right greater than left. There is a platelike appearance of these opacities on the left, but not on the  right. IMPRESSION: 1. Improved pulmonary edema. 2. More focal opacities are seen in the bases, possibly representing developing infiltrate on the right and atelectasis on the left. Recommend clinical correlation and follow-up to resolution. Electronically Signed   By: Dorise Bullion III M.D   On: 04/10/2016 07:28   Dg Chest Port 1 View  Result Date: 04/09/2016 CLINICAL DATA:  Shortness of Breath EXAM: PORTABLE CHEST 1 VIEW COMPARISON:  11/06/2014 FINDINGS: Interval removal of right dialysis catheter. Prior CABG. Heart is borderline in size. There is diffuse interstitial prominence and vascular congestion. This may reflect mild interstitial edema. No effusions. No acute bony abnormality. IMPRESSION: Vascular congestion with increasing interstitial prominence, possibly interstitial edema. Electronically Signed   By: Rolm Baptise M.D.   On: 04/09/2016 09:19    Scheduled: . amLODipine  10 mg Oral Daily  . clopidogrel  75 mg Oral Daily  . [START ON 04/12/2016] doxercalciferol  3 mcg Intravenous Q M,W,F-HD  . ferric citrate  420 mg Oral TID  . [START ON 04/14/2016] ferric gluconate (FERRLECIT/NULECIT) IV  62.5 mg Intravenous Q Wed-HD  . insulin aspart  0-20 Units Subcutaneous TID WC  . insulin detemir  17 Units Subcutaneous BID  . nitroGLYCERIN  1 inch Topical Q6H  . polyethylene glycol  17 g Oral Daily  . pravastatin  40 mg Oral Daily  . pregabalin  75 mg Oral Daily  . QUEtiapine  25 mg Oral Daily  . tamsulosin  0.4 mg Oral Daily  LOS: 0 days   Isla Sabree C 04/10/2016,9:38 AM

## 2016-04-10 NOTE — Progress Notes (Signed)
Pt's son Dahbu at bedside interpreting for pt.  Decline facility interpreter from facility.  Karie Kirks, Therapist, sports.

## 2016-04-10 NOTE — Progress Notes (Signed)
Pt's  Troponin collected at 1017 is 4.26.  Dr. Tana Coast  aware.   Karie Kirks, Therapist, sports.

## 2016-04-11 ENCOUNTER — Inpatient Hospital Stay (HOSPITAL_COMMUNITY): Payer: Medicare Other

## 2016-04-11 DIAGNOSIS — I5043 Acute on chronic combined systolic (congestive) and diastolic (congestive) heart failure: Secondary | ICD-10-CM

## 2016-04-11 DIAGNOSIS — I214 Non-ST elevation (NSTEMI) myocardial infarction: Secondary | ICD-10-CM

## 2016-04-11 DIAGNOSIS — I361 Nonrheumatic tricuspid (valve) insufficiency: Secondary | ICD-10-CM

## 2016-04-11 HISTORY — DX: Non-ST elevation (NSTEMI) myocardial infarction: I21.4

## 2016-04-11 LAB — ECHOCARDIOGRAM COMPLETE
CHL CUP DOP CALC LVOT VTI: 16.4 cm
CHL CUP STROKE VOLUME: 38 mL
E/e' ratio: 11.26
FS: 15 % — AB (ref 28–44)
IVS/LV PW RATIO, ED: 0.65
LA diam index: 1.96 cm/m2
LA vol A4C: 50.6 ml
LA vol index: 33.3 mL/m2
LASIZE: 36 mm
LAVOL: 61.2 mL
LEFT ATRIUM END SYS DIAM: 36 mm
LV SIMPSON'S DISK: 36
LV sys vol index: 37 mL/m2
LV sys vol: 69 mL — AB (ref 21–61)
LVDIAVOL: 107 mL (ref 62–150)
LVDIAVOLIN: 58 mL/m2
LVEEAVG: 11.26
LVEEMED: 11.26
LVELAT: 6.42 cm/s
LVOT area: 4.15 cm2
LVOT diameter: 23 mm
LVOT peak grad rest: 3 mmHg
LVOTPV: 86.5 cm/s
LVOTSV: 68 mL
MV pk E vel: 72.3 m/s
MVPG: 2 mmHg
MVPKAVEL: 113 m/s
PW: 17 mm — AB (ref 0.6–1.1)
RV LATERAL S' VELOCITY: 9.79 cm/s
TAPSE: 12.6 mm
TDI e' lateral: 6.42
TDI e' medial: 4.46
WEIGHTICAEL: 2504.43 [oz_av]

## 2016-04-11 LAB — HEPARIN LEVEL (UNFRACTIONATED): HEPARIN UNFRACTIONATED: 0.39 [IU]/mL (ref 0.30–0.70)

## 2016-04-11 LAB — GLUCOSE, CAPILLARY
GLUCOSE-CAPILLARY: 363 mg/dL — AB (ref 65–99)
GLUCOSE-CAPILLARY: 78 mg/dL (ref 65–99)
Glucose-Capillary: 56 mg/dL — ABNORMAL LOW (ref 65–99)
Glucose-Capillary: 105 mg/dL — ABNORMAL HIGH (ref 65–99)
Glucose-Capillary: 186 mg/dL — ABNORMAL HIGH (ref 65–99)

## 2016-04-11 LAB — CBC
HEMATOCRIT: 29.7 % — AB (ref 39.0–52.0)
Hemoglobin: 9.6 g/dL — ABNORMAL LOW (ref 13.0–17.0)
MCH: 31.5 pg (ref 26.0–34.0)
MCHC: 32.3 g/dL (ref 30.0–36.0)
MCV: 97.4 fL (ref 78.0–100.0)
PLATELETS: 188 10*3/uL (ref 150–400)
RBC: 3.05 MIL/uL — ABNORMAL LOW (ref 4.22–5.81)
RDW: 15.2 % (ref 11.5–15.5)
WBC: 7.9 10*3/uL (ref 4.0–10.5)

## 2016-04-11 MED ORDER — BISACODYL 5 MG PO TBEC
10.0000 mg | DELAYED_RELEASE_TABLET | Freq: Every day | ORAL | Status: DC | PRN
  Administered 2016-04-11: 10 mg via ORAL
  Filled 2016-04-11: qty 2

## 2016-04-11 MED ORDER — INSULIN ASPART 100 UNIT/ML ~~LOC~~ SOLN
0.0000 [IU] | Freq: Three times a day (TID) | SUBCUTANEOUS | Status: DC
  Administered 2016-04-13: 3 [IU] via SUBCUTANEOUS
  Administered 2016-04-13: 5 [IU] via SUBCUTANEOUS

## 2016-04-11 MED ORDER — INSULIN ASPART 100 UNIT/ML ~~LOC~~ SOLN
0.0000 [IU] | Freq: Every day | SUBCUTANEOUS | Status: DC
Start: 2016-04-11 — End: 2016-04-13

## 2016-04-11 MED ORDER — INSULIN ASPART 100 UNIT/ML ~~LOC~~ SOLN: 4.0000 [IU] | Freq: Three times a day (TID) | SUBCUTANEOUS | Status: DC

## 2016-04-11 NOTE — Progress Notes (Signed)
Patient without complaint during 7 a to 7 p shift.  CBG at suppertime 105, no insulin given.  Patient ate 100% of supper.  Night shift RN notified of difficulties with blood sugar during day shift so that close eye could be kept on his blood sugars and snack can be given to patient prior to him becoming NPO after midnight for impending cath.  RN able to wean patient off oxygen, 99% on room air, no complaints of shortness of breath.  Family in to visit for large part of shift.

## 2016-04-11 NOTE — Progress Notes (Signed)
Transport came to pick patient up for echo, patient refused to go said he just got his breakfast.  Echo lab called this RN saying patient needed to go this a.m. Per doctors request.  I informed patient of this, he stated he didn't care what they did he was going to eat his breakfast.  Informed echo lab I can not force him to go and she said she understood but could not say when she would be able to get back with him.  Again I expressed this to patient who again said he was going to eat his breakfast.

## 2016-04-11 NOTE — Progress Notes (Signed)
  Echocardiogram 2D Echocardiogram has been performed.  Johny Chess 04/11/2016, 10:47 AM

## 2016-04-11 NOTE — Progress Notes (Signed)
ANTICOAGULATION CONSULT NOTE - Follow Up Consult  Pharmacy Consult for Heparin Indication: chest pain/ACS  Allergies  Allergen Reactions  . Oxycodone Other (See Comments)    Hallucinations    Patient Measurements: Weight: 156 lb 8.4 oz (71 kg)  Vital Signs: Temp: 98.4 F (36.9 C) (04/01 0615) Temp Source: Oral (04/01 0615) BP: 154/65 (04/01 0615) Pulse Rate: 75 (04/01 0615)  Labs:  Recent Labs  04/09/16 0903 04/09/16 1214 04/09/16 1909 04/10/16 0341 04/10/16 1017 04/10/16 1507 04/10/16 2236 04/11/16 0432 04/11/16 0443  HGB 11.4* 10.7*  --  10.4*  --   --   --  9.6*  --   HCT 34.2* 33.1*  --  32.2*  --   --   --  29.7*  --   PLT 205 219  --  224  --   --   --  188  --   HEPARINUNFRC  --   --  0.27* 0.23*  --   --   --   --  0.39  CREATININE 5.11* 5.19*  --  4.00*  --   --   --   --   --   TROPONINI  --   --   --   --  4.26* 3.56* 2.46*  --   --     Estimated Creatinine Clearance: 15.2 mL/min (A) (by C-G formula based on SCr of 4 mg/dL (H)).  Assessment: 64 yoM presents for ACS rule out to start on heparin gtt. Troponins elevated, cards to evaluate ECHO to decide on invasive management. Heparin level therapeutic at 0.39. Hgb and platelets trending down slightly, no documented S/Sx bleeding.  Goal of Therapy:  Heparin level 0.3-0.7 units/ml Monitor platelets by anticoagulation protocol: Yes   Plan:  -Continue heparin 1200 units/hr -Monitor heparin level, CBc, S/Sx bleeding daily -Follow-up ECHO and cards plans  Arrie Senate, PharmD PGY-1 Pharmacy Resident Pager: [phone] 04/11/2016

## 2016-04-11 NOTE — Progress Notes (Signed)
Progress Note  Patient Name: Aaron Long Date of Encounter: 04/11/2016  Primary Cardiologist: Angelena Form  Subjective   Breathing well lying flat. No angina.  Inpatient Medications    Scheduled Meds: . amLODipine  5 mg Oral Daily  . carvedilol  3.125 mg Oral BID WC  . clopidogrel  75 mg Oral Daily  . [START ON 04/12/2016] doxercalciferol  3 mcg Intravenous Q M,W,F-HD  . ferric citrate  420 mg Oral TID  . [START ON 04/14/2016] ferric gluconate (FERRLECIT/NULECIT) IV  62.5 mg Intravenous Q Wed-HD  . insulin aspart  0-20 Units Subcutaneous TID WC  . insulin aspart  4 Units Subcutaneous TID WC  . insulin detemir  17 Units Subcutaneous BID  . nitroGLYCERIN  1 inch Topical Q6H  . polyethylene glycol  17 g Oral Daily  . pravastatin  40 mg Oral Daily  . pregabalin  75 mg Oral Daily  . QUEtiapine  25 mg Oral Daily  . tamsulosin  0.4 mg Oral Daily   Continuous Infusions: . heparin 1,200 Units/hr (04/11/16 0828)   PRN Meds: acetaminophen **OR** acetaminophen, HYDROcodone-acetaminophen, ondansetron **OR** ondansetron (ZOFRAN) IV   Vital Signs    Vitals:   04/10/16 1817 04/10/16 1938 04/11/16 0051 04/11/16 0615  BP: 116/70 105/60 137/68 (!) 154/65  Pulse: 83 81 78 75  Resp:  18 18 18   Temp:  98.9 F (37.2 C) 98.7 F (37.1 C) 98.4 F (36.9 C)  TempSrc:  Oral Oral Oral  SpO2:  100% 100% 100%  Weight:    71 kg (156 lb 8.4 oz)    Intake/Output Summary (Last 24 hours) at 04/11/16 1100 Last data filed at 04/11/16 0922  Gross per 24 hour  Intake           964.65 ml  Output             1500 ml  Net          -535.35 ml   Filed Weights   04/10/16 1331 04/10/16 1630 04/11/16 0615  Weight: 72.1 kg (158 lb 15.2 oz) 71 kg (156 lb 8.4 oz) 71 kg (156 lb 8.4 oz)    Telemetry    NSR - Personally Reviewed  Physical Exam  Comfiortable GEN: No acute distress.   Neck: No JVD Cardiac: RRR, no murmurs, rubs, or gallops. Right arm fistula with good bruit/thrill Respiratory: Clear  to auscultation bilaterally. GI: Soft, nontender, non-distended  MS: No edema; S/P Bilat BKA Neuro:  Nonfocal  Psych: Normal affect   Labs    Chemistry Recent Labs Lab 04/09/16 0903 04/09/16 1214 04/10/16 0341  NA 134* 134* 134*  K 4.0 3.7 3.4*  CL 95* 96* 92*  CO2 25 26 29   GLUCOSE 365* 337* 174*  BUN 19 20 13   CREATININE 5.11* 5.19* 4.00*  CALCIUM 10.3 9.9 9.4  ALBUMIN  --  3.5  --   GFRNONAA 10* 10* 13*  GFRAA 11* 11* 15*  ANIONGAP 14 12 13      Hematology Recent Labs Lab 04/09/16 1214 04/10/16 0341 04/11/16 0432  WBC 11.4* 10.3 7.9  RBC 3.46* 3.37* 3.05*  HGB 10.7* 10.4* 9.6*  HCT 33.1* 32.2* 29.7*  MCV 95.7 95.5 97.4  MCH 30.9 30.9 31.5  MCHC 32.3 32.3 32.3  RDW 14.8 14.8 15.2  PLT 219 224 188    Cardiac Enzymes Recent Labs Lab 04/10/16 1017 04/10/16 1507 04/10/16 2236  TROPONINI 4.26* 3.56* 2.46*    Recent Labs Lab 04/09/16 0913  TROPIPOC 1.85*  BNPNo results for input(s): BNP, PROBNP in the last 168 hours.   DDimer No results for input(s): DDIMER in the last 168 hours.   Radiology    Portable Chest 1 View  Result Date: 04/10/2016 CLINICAL DATA:  Pulmonary edema. EXAM: PORTABLE CHEST 1 VIEW COMPARISON:  April 09, 2016 FINDINGS: Stable mild cardiomegaly. The hila and mediastinum are unchanged. No pneumothorax. Diffuse interstitial opacities seen previously have improved. More focal opacities are seen in the bases, right greater than left. There is a platelike appearance of these opacities on the left, but not on the right. IMPRESSION: 1. Improved pulmonary edema. 2. More focal opacities are seen in the bases, possibly representing developing infiltrate on the right and atelectasis on the left. Recommend clinical correlation and follow-up to resolution. Electronically Signed   By: Dorise Bullion III M.D   On: 04/10/2016 07:28    Cardiac Studies   Preliminary review of bedside echo shows new wall motion abnormality (marked hypokinesis of  the basal-mid anterior wall and anterior septum with preserved apical wall motion, EF around 40%)  Patient Profile     76 y.o. male with CAD, CABG 2007, patent grafts at cath 2016, ESRD on MD MWF, PAD s/p Bilat BKA presents with acute pulmonary edema, new ECG changes, small NSTEMI by enzymes and echo suggestive of LAD lesion with patent LIMA to distal vessel.  Assessment & Plan    1. Acute (systolic and diastolic) heart failure - new wall motion abnormality and lower EF by echo. Since he had not missed dialysis or meds or had excessive volume intake, it is likely that acute worsening is due to new ischemic LV dysfunction. Appears euvolemic by exam today.  2. NSTEMI - the increase in cardiac enzymes is mild and would not expect CHF exacerbation from that size infarction. Suspect there is substantial residual viable myocardium that may be in jeopardy. Best next step is angiography. If the proximal LAD is 100% occluded, may have to settle for medical therapy, but if the vessel is patent can try PCI. Needs femoral approach, which may be challenging due to the extent of his PAD.  3. CAD s/p CABG - mostly graft dependent by cath 2016, but the LAD was still patent with 80% stenosis 4. ESRD on HD -  will try to get HD as early as possible in AM and plan cardiac cath in the afternoon. 5. PAD s/p bilat BKA  This procedure has been fully reviewed with the patient and his family and written informed consent has been obtained.   Signed, Sanda Klein, MD  04/11/2016, 11:00 AM

## 2016-04-11 NOTE — Progress Notes (Signed)
[facility]                                                                              Patient Demographics  Aaron Long, is a 76 y.o. male, DOB - 01-26-1940, SNK:[id number]  Admit date - 04/09/2016   Admitting Physician Elwin Mocha, MD  Outpatient Primary MD for the patient is Pcp Not In System  Outpatient specialists:   LOS - 1  days    Chief Complaint  Patient presents with  . Shortness of Breath       Brief summary   Patient is a 76 year old male with BPH, anemia, ESRD on HD MWF, diabetes, history of venous thromboembolism, CAD who presented with shortness of breath. Patient reported that he has not missed any dialysis. However per day before and the admission he started having shortness of breath otherwise no chest pain, nausea, vomiting or any diaphoresis.  ED course: Chest x-ray showed interstitial edema pulmonary and patient was given Nitropaste. Nephrology and cardiology were consulted. Patient had one set of troponin point of care +1.85, was started on heparin.   Assessment & Plan    Principal Problem:  Acute diastolic CHF (congestive heart failure) (HCC)/Volume overload with interstitial pulmonary edema and in the setting of ESRD on hemodialysis - 2-D echo in 09/2014 had shown EF of 50-55% with grade 1 diastolic dysfunction - Chest x-ray had shown interstitial pulmonary edema, troponin point of care 8, patient was started on heparin drip. Per admission note, cardiology and nephrology was consulted. - Patient was started on heparin drip. Underwent urgent hemodialysis,. - Serial troponins trending down, 2-D echo still pending   Active Problems: Elevated troponin - Possibly due to volume overload and acute pulmonary edema. However patient has underlying history of CAD - Only one troponin POC +1.85, troponin 4.26, serial troponins trending down - Currently no chest pain, patient was started on heparin drip and cardiology was consulted -  Continue Plavix, statin, heparin drip - Cardiology following, plan to review echo and then decide whether to pursue with cardiac cath  ESRD on hemodialysis [facility]) MWF - Nephrology was consulted, chest x-ray showed interstitial pulmonary edema - Patient underwent hemodialysis per schedule, extra HD on 3/31    HTN (hypertension) - BP currently stable, continue HD and continue amlodipine    Diabetes mellitus with peripheral vascular disease ([place]) - CBGs elevated this morning, Levemir, NovoLog sliding scale and 4units meal coverage  Code Status: Full CODE STATUS DVT Prophylaxis:   heparin drip  Family Communication: Discussed in detail with the patient, all imaging results, lab results explained to the patient   Disposition Plan:   Time Spent in minutes  25 minutes  Procedures:    Consultants:   Nephrology Cardiology  Antimicrobials:   None   Medications  Scheduled Meds: . amLODipine  5 mg Oral Daily  . carvedilol  3.125 mg Oral BID WC  . clopidogrel  75 mg Oral Daily  . [START ON 04/12/2016] doxercalciferol  3 mcg Intravenous Q M,W,F-HD  . ferric citrate  420 mg Oral TID  . [START ON 04/14/2016] ferric gluconate (FERRLECIT/NULECIT) IV  62.5 mg Intravenous Q  Wed-HD  . insulin aspart  0-20 Units Subcutaneous TID WC  . insulin detemir  17 Units Subcutaneous BID  . nitroGLYCERIN  1 inch Topical Q6H  . polyethylene glycol  17 g Oral Daily  . pravastatin  40 mg Oral Daily  . pregabalin  75 mg Oral Daily  . QUEtiapine  25 mg Oral Daily  . tamsulosin  0.4 mg Oral Daily   Continuous Infusions: . heparin 1,200 Units/hr (04/11/16 0828)   PRN Meds:.acetaminophen **OR** acetaminophen, HYDROcodone-acetaminophen, ondansetron **OR** ondansetron (ZOFRAN) IV   Antibiotics   Anti-infectives    None        Subjective:   Aaron Long was seen and examined today.  Denies any chest pain, shortness of breath. Watching TV. Patient denies dizziness, abdominal pain, N/V/D/C, new  weakness, numbess, tingling. No acute events overnight.    Objective:   Vitals:   04/10/16 1817 04/10/16 1938 04/11/16 0051 04/11/16 0615  BP: 116/70 105/60 137/68 (!) 154/65  Pulse: 83 81 78 75  Resp:  18 18 18   Temp:  98.9 F (37.2 C) 98.7 F (37.1 C) 98.4 F (36.9 C)  TempSrc:  Oral Oral Oral  SpO2:  100% 100% 100%  Weight:    71 kg (156 lb 8.4 oz)    Intake/Output Summary (Last 24 hours) at 04/11/16 1024 Last data filed at 04/11/16 3762  Gross per 24 hour  Intake           964.65 ml  Output             1500 ml  Net          -535.35 ml     Wt Readings from Last 3 Encounters:  04/11/16 71 kg (156 lb 8.4 oz)  10/30/15 77.1 kg (170 lb)  05/05/15 77.3 kg (170 lb 6.7 oz)     Exam  General: Alert and oriented x 3, NAD   HEENT:    Neck: Supple, no JVD, no masses  Cardiovascular: S1 S2 Clear, RRR  Respiratory: Clear to auscultation bilaterally, no wheezing, rales or rhonchi  Gastrointestinal: Soft, nontender, nondistended, + bowel sounds  Ext: no cyanosis clubbing, bilateral AKA   Neuro: nonfocal  Skin: No rashes  Psych: Normal affect and demeanor, alert and oriented x3    Data Reviewed:  I have personally reviewed following labs and imaging studies  Micro Results No results found for this or any previous visit (from the past 240 hour(s)).  Radiology Reports Portable Chest 1 View  Result Date: 04/10/2016 CLINICAL DATA:  Pulmonary edema. EXAM: PORTABLE CHEST 1 VIEW COMPARISON:  April 09, 2016 FINDINGS: Stable mild cardiomegaly. The hila and mediastinum are unchanged. No pneumothorax. Diffuse interstitial opacities seen previously have improved. More focal opacities are seen in the bases, right greater than left. There is a platelike appearance of these opacities on the left, but not on the right. IMPRESSION: 1. Improved pulmonary edema. 2. More focal opacities are seen in the bases, possibly representing developing infiltrate on the right and atelectasis on  the left. Recommend clinical correlation and follow-up to resolution. Electronically Signed   By: Dorise Bullion III M.D   On: 04/10/2016 07:28   Dg Chest Port 1 View  Result Date: 04/09/2016 CLINICAL DATA:  Shortness of Breath EXAM: PORTABLE CHEST 1 VIEW COMPARISON:  11/06/2014 FINDINGS: Interval removal of right dialysis catheter. Prior CABG. Heart is borderline in size. There is diffuse interstitial prominence and vascular congestion. This may reflect mild interstitial edema. No effusions. No acute  bony abnormality. IMPRESSION: Vascular congestion with increasing interstitial prominence, possibly interstitial edema. Electronically Signed   By: Rolm Baptise M.D.   On: 04/09/2016 09:19    Lab Data:  CBC:  Recent Labs Lab 04/09/16 0903 04/09/16 1214 04/10/16 0341 04/11/16 0432  WBC 12.4* 11.4* 10.3 7.9  NEUTROABS 10.0*  --   --   --   HGB 11.4* 10.7* 10.4* 9.6*  HCT 34.2* 33.1* 32.2* 29.7*  MCV 94.2 95.7 95.5 97.4  PLT 205 219 224 765   Basic Metabolic Panel:  Recent Labs Lab 04/09/16 0903 04/09/16 1214 04/10/16 0341  NA 134* 134* 134*  K 4.0 3.7 3.4*  CL 95* 96* 92*  CO2 25 26 29   GLUCOSE 365* 337* 174*  BUN 19 20 13   CREATININE 5.11* 5.19* 4.00*  CALCIUM 10.3 9.9 9.4  PHOS  --  3.9  --    GFR: Estimated Creatinine Clearance: 15.2 mL/min (A) (by C-G formula based on SCr of 4 mg/dL (H)). Liver Function Tests:  Recent Labs Lab 04/09/16 1214  ALBUMIN 3.5   No results for input(s): LIPASE, AMYLASE in the last 168 hours. No results for input(s): AMMONIA in the last 168 hours. Coagulation Profile: No results for input(s): INR, PROTIME in the last 168 hours. Cardiac Enzymes:  Recent Labs Lab 04/10/16 1017 04/10/16 1507 04/10/16 2236  TROPONINI 4.26* 3.56* 2.46*   BNP (last 3 results) No results for input(s): PROBNP in the last 8760 hours. HbA1C: No results for input(s): HGBA1C in the last 72 hours. CBG:  Recent Labs Lab 04/10/16 1127 04/10/16 1802  04/10/16 2141 04/10/16 2354 04/11/16 0729  GLUCAP 165* 216* 85 326* 363*   Lipid Profile: No results for input(s): CHOL, HDL, LDLCALC, TRIG, CHOLHDL, LDLDIRECT in the last 72 hours. Thyroid Function Tests: No results for input(s): TSH, T4TOTAL, FREET4, T3FREE, THYROIDAB in the last 72 hours. Anemia Panel: No results for input(s): VITAMINB12, FOLATE, FERRITIN, TIBC, IRON, RETICCTPCT in the last 72 hours. Urine analysis:    Component Value Date/Time   COLORURINE YELLOW 10/15/2014 1344   APPEARANCEUR CLEAR 10/15/2014 1344   APPEARANCEUR Turbid (A) 07/12/2014 1644   LABSPEC 1.017 10/15/2014 1344   PHURINE 6.0 10/15/2014 1344   GLUCOSEU >1000 (A) 10/15/2014 1344   HGBUR SMALL (A) 10/15/2014 1344   BILIRUBINUR NEGATIVE 10/15/2014 1344   BILIRUBINUR Negative 07/12/2014 1644   KETONESUR NEGATIVE 10/15/2014 1344   PROTEINUR >300 (A) 10/15/2014 1344   UROBILINOGEN 0.2 10/15/2014 1344   NITRITE NEGATIVE 10/15/2014 1344   LEUKOCYTESUR NEGATIVE 10/15/2014 1344   LEUKOCYTESUR 3+ (A) 07/12/2014 1644     Rickesha Veracruz M.D. [facility] 04/11/2016, 10:24 AM  Pager: [phone] Between 7am to 7pm - call Pager - [phone]  After 7pm go to [email or web address] - password [email or web address]  Call night coverage person covering after 7pm

## 2016-04-11 NOTE — Progress Notes (Signed)
Patients blood sugar noted to be 56.  Was 363 this morning, received 20 units of Novolog and then his home dose of Levemir.  Snack given in addition to lunch trays on unit.  Will monitor.

## 2016-04-11 NOTE — Progress Notes (Signed)
Assessment/Plan 1. Volume overload / interstitial pulmonary edema, improved with HD 2. ESRD -  MWF HD; Extra short HD done on Sat, for pre cath HD Monday 3. Hypertension/volume - bp improved with dialysis 4. Pos Troponin & new wall motion abn on echo, for cath in AM  Subjective: Interval History: Feels ok now, breathing fine supine  Objective: Vital signs in last 24 hours: Temp:  [97.4 F (36.3 C)-98.9 F (37.2 C)] 98.4 F (36.9 C) (04/01 0615) Pulse Rate:  [66-83] 71 (04/01 1104) Resp:  [18-21] 18 (04/01 0615) BP: (92-154)/(47-70) 114/63 (04/01 1104) SpO2:  [100 %] 100 % (04/01 1104) Weight:  [71 kg (156 lb 8.4 oz)] 71 kg (156 lb 8.4 oz) (04/01 0615) Weight change: -3.288 kg (-7 lb 4 oz)  Intake/Output from previous day: 03/31 0701 - 04/01 0700 In: 964.7 [P.O.:720; I.V.:244.7] Out: 1500  Intake/Output this shift: Total I/O In: 240 [P.O.:240] Out: -   General appearance: alert and cooperative Resp: clear to auscultation bilaterally Cardio: regular rate and rhythm, S1, S2 normal, no murmur, click, rub or gallop Extremities: extremities normal, atraumatic, no cyanosis or edema  Lab Results:  Recent Labs  04/10/16 0341 04/11/16 0432  WBC 10.3 7.9  HGB 10.4* 9.6*  HCT 32.2* 29.7*  PLT 224 188   BMET:  Recent Labs  04/09/16 1214 04/10/16 0341  NA 134* 134*  K 3.7 3.4*  CL 96* 92*  CO2 26 29  GLUCOSE 337* 174*  BUN 20 13  CREATININE 5.19* 4.00*  CALCIUM 9.9 9.4   No results for input(s): PTH in the last 72 hours. Iron Studies: No results for input(s): IRON, TIBC, TRANSFERRIN, FERRITIN in the last 72 hours. Studies/Results: Portable Chest 1 View  Result Date: 04/10/2016 CLINICAL DATA:  Pulmonary edema. EXAM: PORTABLE CHEST 1 VIEW COMPARISON:  April 09, 2016 FINDINGS: Stable mild cardiomegaly. The hila and mediastinum are unchanged. No pneumothorax. Diffuse interstitial opacities seen previously have improved. More focal opacities are seen in the bases,  right greater than left. There is a platelike appearance of these opacities on the left, but not on the right. IMPRESSION: 1. Improved pulmonary edema. 2. More focal opacities are seen in the bases, possibly representing developing infiltrate on the right and atelectasis on the left. Recommend clinical correlation and follow-up to resolution. Electronically Signed   By: Dorise Bullion III M.D   On: 04/10/2016 07:28    Scheduled: . amLODipine  5 mg Oral Daily  . carvedilol  3.125 mg Oral BID WC  . clopidogrel  75 mg Oral Daily  . [START ON 04/12/2016] doxercalciferol  3 mcg Intravenous Q M,W,F-HD  . ferric citrate  420 mg Oral TID  . [START ON 04/14/2016] ferric gluconate (FERRLECIT/NULECIT) IV  62.5 mg Intravenous Q Wed-HD  . insulin aspart  0-20 Units Subcutaneous TID WC  . insulin aspart  4 Units Subcutaneous TID WC  . insulin detemir  17 Units Subcutaneous BID  . nitroGLYCERIN  1 inch Topical Q6H  . polyethylene glycol  17 g Oral Daily  . pravastatin  40 mg Oral Daily  . pregabalin  75 mg Oral Daily  . QUEtiapine  25 mg Oral Daily  . tamsulosin  0.4 mg Oral Daily    LOS: 1 day   Aaron Long C 04/11/2016,2:37 PM

## 2016-04-11 NOTE — Progress Notes (Signed)
Patient cold and appears a little drowsy.  Says he feels fine.  CBG checked and was 78 after eating a full lunch and multiple snacks.  Did notify MD, changes made to insulin therapy.

## 2016-04-12 ENCOUNTER — Encounter (HOSPITAL_COMMUNITY)
Admission: EM | Disposition: A | Discharge status: Home / Self Care | Payer: Self-pay | Source: Home / Self Care | Attending: Internal Medicine

## 2016-04-12 DIAGNOSIS — I10 Essential (primary) hypertension: Secondary | ICD-10-CM

## 2016-04-12 DIAGNOSIS — I509 Heart failure, unspecified: Secondary | ICD-10-CM

## 2016-04-12 DIAGNOSIS — I25110 Atherosclerotic heart disease of native coronary artery with unstable angina pectoris: Secondary | ICD-10-CM

## 2016-04-12 HISTORY — PX: LEFT HEART CATH AND CORONARY ANGIOGRAPHY: CATH118249

## 2016-04-12 LAB — BASIC METABOLIC PANEL
Anion gap: 11 (ref 5–15)
BUN: 29 mg/dL — ABNORMAL HIGH (ref 6–20)
CALCIUM: 9 mg/dL (ref 8.9–10.3)
CHLORIDE: 92 mmol/L — AB (ref 101–111)
CO2: 29 mmol/L (ref 22–32)
CREATININE: 6.42 mg/dL — AB (ref 0.61–1.24)
GFR calc non Af Amer: 7 mL/min — ABNORMAL LOW (ref >60)
GFR, EST AFRICAN AMERICAN: 9 mL/min — AB (ref >60)
Glucose, Bld: 204 mg/dL — ABNORMAL HIGH (ref 65–99)
Potassium: 3.4 mmol/L — ABNORMAL LOW (ref 3.5–5.1)
SODIUM: 132 mmol/L — AB (ref 135–145)

## 2016-04-12 LAB — CBC
HCT: 28.8 % — ABNORMAL LOW (ref 39.0–52.0)
HEMOGLOBIN: 9.3 g/dL — AB (ref 13.0–17.0)
MCH: 30.9 pg (ref 26.0–34.0)
MCHC: 32.3 g/dL (ref 30.0–36.0)
MCV: 95.7 fL (ref 78.0–100.0)
Platelets: 210 10*3/uL (ref 150–400)
RBC: 3.01 MIL/uL — ABNORMAL LOW (ref 4.22–5.81)
RDW: 15.2 % (ref 11.5–15.5)
WBC: 7.6 10*3/uL (ref 4.0–10.5)

## 2016-04-12 LAB — POCT ACTIVATED CLOTTING TIME
ACTIVATED CLOTTING TIME: 147 s
ACTIVATED CLOTTING TIME: 153 s

## 2016-04-12 LAB — GLUCOSE, CAPILLARY
GLUCOSE-CAPILLARY: 167 mg/dL — AB (ref 65–99)
GLUCOSE-CAPILLARY: 86 mg/dL (ref 65–99)
Glucose-Capillary: 79 mg/dL (ref 65–99)
Glucose-Capillary: 172 mg/dL — ABNORMAL HIGH (ref 65–99)

## 2016-04-12 LAB — HEPARIN LEVEL (UNFRACTIONATED): HEPARIN UNFRACTIONATED: 0.44 [IU]/mL (ref 0.30–0.70)

## 2016-04-12 LAB — HEMOGLOBIN A1C
Hgb A1c MFr Bld: 10.7 % — ABNORMAL HIGH (ref 4.8–5.6)
Mean Plasma Glucose: 260 mg/dL

## 2016-04-12 LAB — PROTIME-INR
INR: 0.99
Prothrombin Time: 13.1 seconds (ref 11.4–15.2)

## 2016-04-12 SURGERY — LEFT HEART CATH AND CORONARY ANGIOGRAPHY
Anesthesia: LOCAL

## 2016-04-12 MED ORDER — ASPIRIN 81 MG PO CHEW
81.0000 mg | CHEWABLE_TABLET | ORAL | Status: AC
  Administered 2016-04-12: 81 mg via ORAL
  Filled 2016-04-12: qty 1

## 2016-04-12 MED ORDER — SODIUM CHLORIDE 0.9 % IV SOLN: INTRAVENOUS | Status: DC

## 2016-04-12 MED ORDER — SODIUM CHLORIDE 0.9% FLUSH: 3.0000 mL | INTRAVENOUS | Status: DC | PRN

## 2016-04-12 MED ORDER — SODIUM CHLORIDE 0.9 % IV SOLN: 250.0000 mL | INTRAVENOUS | Status: DC | PRN

## 2016-04-12 MED ORDER — SODIUM CHLORIDE 0.9% FLUSH
3.0000 mL | Freq: Two times a day (BID) | INTRAVENOUS | Status: DC
  Administered 2016-04-12: 3 mL via INTRAVENOUS

## 2016-04-12 MED ORDER — HEPARIN (PORCINE) IN NACL 2-0.9 UNIT/ML-% IJ SOLN
INTRAMUSCULAR | Status: DC | PRN
  Administered 2016-04-12: 1000 mL

## 2016-04-12 MED ORDER — SODIUM CHLORIDE 0.9% FLUSH
3.0000 mL | Freq: Two times a day (BID) | INTRAVENOUS | Status: DC
  Administered 2016-04-13: 3 mL via INTRAVENOUS

## 2016-04-12 MED ORDER — HEPARIN SODIUM (PORCINE) 5000 UNIT/ML IJ SOLN
5000.0000 [IU] | Freq: Three times a day (TID) | INTRAMUSCULAR | Status: DC
  Filled 2016-04-12: qty 1

## 2016-04-12 MED ORDER — IOPAMIDOL (ISOVUE-370) INJECTION 76%
INTRAVENOUS | Status: AC
  Filled 2016-04-12: qty 125

## 2016-04-12 MED ORDER — HEPARIN (PORCINE) IN NACL 2-0.9 UNIT/ML-% IJ SOLN
INTRAMUSCULAR | Status: AC
  Filled 2016-04-12: qty 1000

## 2016-04-12 MED ORDER — LIDOCAINE HCL (PF) 1 % IJ SOLN
INTRAMUSCULAR | Status: DC | PRN
  Administered 2016-04-12: 10 mL via INTRADERMAL
  Administered 2016-04-12: 20 mL via INTRADERMAL

## 2016-04-12 MED ORDER — SODIUM CHLORIDE 0.9 % IV SOLN
INTRAVENOUS | Status: DC
  Administered 2016-04-12: 07:00:00 via INTRAVENOUS

## 2016-04-12 MED ORDER — DARBEPOETIN ALFA 60 MCG/0.3ML IJ SOSY: 60.0000 ug | PREFILLED_SYRINGE | INTRAMUSCULAR | Status: DC

## 2016-04-12 MED ORDER — LIDOCAINE HCL (PF) 1 % IJ SOLN
INTRAMUSCULAR | Status: AC
  Filled 2016-04-12: qty 30

## 2016-04-12 MED ORDER — IOPAMIDOL (ISOVUE-370) INJECTION 76%
INTRAVENOUS | Status: DC | PRN
  Administered 2016-04-12: 45 mL via INTRA_ARTERIAL

## 2016-04-12 MED ORDER — DOXERCALCIFEROL 4 MCG/2ML IV SOLN
INTRAVENOUS | Status: AC
  Filled 2016-04-12: qty 2

## 2016-04-12 SURGICAL SUPPLY — 11 items
CATH INFINITI 5 FR IM (CATHETERS) ×1 IMPLANT
CATH INFINITI 5FR MULTPACK ANG (CATHETERS) ×1 IMPLANT
COVER PRB 48X5XTLSCP FOLD TPE (BAG) IMPLANT
COVER PROBE 5X48 (BAG) ×2
KIT HEART LEFT (KITS) ×2 IMPLANT
PACK CARDIAC CATHETERIZATION (CUSTOM PROCEDURE TRAY) ×2 IMPLANT
SET INTRODUCER MICROPUNCT 5F (INTRODUCER) ×1 IMPLANT
SHEATH PINNACLE 5F 10CM (SHEATH) ×1 IMPLANT
SYR MEDRAD MARK V 150ML (SYRINGE) ×2 IMPLANT
TRANSDUCER W/STOPCOCK (MISCELLANEOUS) ×2 IMPLANT
TUBING CIL FLEX 10 FLL-RA (TUBING) ×2 IMPLANT

## 2016-04-12 NOTE — Progress Notes (Signed)
Progress Note  Patient Name: Aaron Long Date of Encounter: 04/12/2016  Primary Cardiologist: Angelena Form  Subjective   Pt sleeping comfortably    Inpatient Medications    Scheduled Meds: . amLODipine  5 mg Oral Daily  . carvedilol  3.125 mg Oral BID WC  . clopidogrel  75 mg Oral Daily  . doxercalciferol  3 mcg Intravenous Q M,W,F-HD  . ferric citrate  420 mg Oral TID  . [START ON 04/14/2016] ferric gluconate (FERRLECIT/NULECIT) IV  62.5 mg Intravenous Q Wed-HD  . insulin aspart  0-15 Units Subcutaneous TID WC  . insulin aspart  0-5 Units Subcutaneous QHS  . insulin detemir  17 Units Subcutaneous BID  . nitroGLYCERIN  1 inch Topical Q6H  . polyethylene glycol  17 g Oral Daily  . pravastatin  40 mg Oral Daily  . pregabalin  75 mg Oral Daily  . QUEtiapine  25 mg Oral Daily  . sodium chloride flush  3 mL Intravenous Q12H  . tamsulosin  0.4 mg Oral Daily   Continuous Infusions: . sodium chloride 10 mL/hr at 04/12/16 0658  . heparin 1,200 Units/hr (04/12/16 0657)   PRN Meds: sodium chloride, acetaminophen **OR** acetaminophen, bisacodyl, HYDROcodone-acetaminophen, ondansetron **OR** ondansetron (ZOFRAN) IV, sodium chloride flush   Vital Signs    Vitals:   04/11/16 1521 04/11/16 1842 04/11/16 2013 04/12/16 0743  BP: 121/60  118/75 (!) 140/94  Pulse: 64  71 76  Resp: 18  18 18   Temp: 98.1 F (36.7 C)  98 F (36.7 C) 98 F (36.7 C)  TempSrc: Oral  Oral Oral  SpO2: 100% 99% 100% 97%  Weight:    163 lb (73.9 kg)    Intake/Output Summary (Last 24 hours) at 04/12/16 0817 Last data filed at 04/12/16 0300  Gross per 24 hour  Intake             1116 ml  Output                0 ml  Net             1116 ml   Filed Weights   04/10/16 1630 04/11/16 0615 04/12/16 0743  Weight: 156 lb 8.4 oz (71 kg) 156 lb 8.4 oz (71 kg) 163 lb (73.9 kg)    Telemetry    NSR - Personally Reviewed  Physical Exam  Comfiortable  Sleeping  GEN: No acute distress.   Neck: No  JVD Cardiac: RRR, no murmurs, rubs, or gallops Respiratory: Clear to auscultation bilaterally anteriorly   GI: Soft, nontender, non-distended  MS: No edema; S/P Bilat BKA   Labs    Chemistry  Recent Labs Lab 04/09/16 1214 04/10/16 0341 04/12/16 0523  NA 134* 134* 132*  K 3.7 3.4* 3.4*  CL 96* 92* 92*  CO2 26 29 29   GLUCOSE 337* 174* 204*  BUN 20 13 29*  CREATININE 5.19* 4.00* 6.42*  CALCIUM 9.9 9.4 9.0  ALBUMIN 3.5  --   --   GFRNONAA 10* 13* 7*  GFRAA 11* 15* 9*  ANIONGAP 12 13 11      Hematology  Recent Labs Lab 04/10/16 0341 04/11/16 0432 04/12/16 0523  WBC 10.3 7.9 7.6  RBC 3.37* 3.05* 3.01*  HGB 10.4* 9.6* 9.3*  HCT 32.2* 29.7* 28.8*  MCV 95.5 97.4 95.7  MCH 30.9 31.5 30.9  MCHC 32.3 32.3 32.3  RDW 14.8 15.2 15.2  PLT 224 188 210    Cardiac Enzymes  Recent Labs Lab 04/10/16 1017 04/10/16  1507 04/10/16 2236  TROPONINI 4.26* 3.56* 2.46*     Recent Labs Lab 04/09/16 0913  TROPIPOC 1.85*     BNPNo results for input(s): BNP, PROBNP in the last 168 hours.   DDimer No results for input(s): DDIMER in the last 168 hours.   Radiology    No results found.  Cardiac Studies   Preliminary review of bedside echo shows new wall motion abnormality (marked hypokinesis of the basal-mid anterior wall and anterior septum with preserved apical wall motion, EF around 40%)  Patient Profile     76 y.o. male with CAD, CABG 2007, patent grafts at cath 2016, ESRD on MD MWF, PAD s/p Bilat BKA presents with acute pulmonary edema, new ECG changes, small NSTEMI by enzymes and echo suggestive of LAD lesion with patent LIMA to distal vessel.  Assessment & Plan    1. Acute (systolic and diastolic) heart failure -  Going to dialysis now  Then will have L heart cath   2. CAD s/p CABG  Now with NSTEMI - Plan for cath after dialysis 3.  4. ESRD on HD - Dialysis today 5.  6. PAD s/p bilat BKA  This procedure has been fully reviewed with the patient and his family  and written informed consent has been obtained.   Signed, Dorris Carnes, MD  04/12/2016, 8:17 AM

## 2016-04-12 NOTE — Progress Notes (Signed)
Pt back from cardiac cath. Pt left groin, soft, clean , dry and intact. No signs of bleeding. Pt hooked back up to telemetry and frequents vital signs. Pt and family aware of 4 hour bed rest.   Nichlas Pitera Leory Plowman

## 2016-04-12 NOTE — Progress Notes (Signed)
[facility]                                                                              Patient Demographics  Aaron Long, is a 76 y.o. male, DOB - 1940/02/08, OJJ:[id number]  Admit date - 04/09/2016   Admitting Physician Elwin Mocha, MD  Outpatient Primary MD for the patient is Pcp Not In System  Outpatient specialists:   LOS - 2  days    Chief Complaint  Patient presents with  . Shortness of Breath       Brief summary   Patient is a 76 year old male with BPH, anemia, ESRD on HD MWF, diabetes, history of venous thromboembolism, CAD who presented with shortness of breath. Patient reported that he has not missed any dialysis. However per day before and the admission he started having shortness of breath otherwise no chest pain, nausea, vomiting or any diaphoresis.  ED course: Chest x-ray showed interstitial edema pulmonary and patient was given Nitropaste. Nephrology and cardiology were consulted. Patient had one set of troponin point of care +1.85, was started on heparin.   Assessment & Plan    Principal Problem:  Acute diastolic CHF (congestive heart failure) (HCC)/Volume overload with interstitial pulmonary edema and in the setting of ESRD on hemodialysis - 2-D echo in 09/2014 had shown EF of 50-55% with grade 1 diastolic dysfunction - Chest x-ray showed interstitial pulmonary edema, troponin point of care elevated - Patient was started on heparin drip. Underwent urgent hemodialysis. 2-D echo 4/1 showed EF of 35-40% with grade 1 diastolic dysfunction - Plan for HD today and then cardiac cath   Active Problems: Elevated troponin - Possibly due to volume overload and acute pulmonary edema. However patient has underlying history of CAD - Only one troponin POC +1.85, troponin 4.26, serial troponins trending down - No chest pain, patient was started on heparin drip and cardiology was consulted - Continue Plavix, statin, heparin drip. 2-D echo showed EF  of 35-40% with grade 1 diastolic dysfunction - Cardiac cath today  ESRD on hemodialysis [facility]) MWF - Nephrology was consulted, chest x-ray showed interstitial pulmonary edema - Patient underwent hemodialysis per schedule, extra HD on 3/31    HTN (hypertension) - BP currently stable, continue HD and continue amlodipine    Diabetes mellitus with peripheral vascular disease (HCC) - Continue Levemir, NovoLog sliding scale and 4units meal coverage  Code Status: Full CODE STATUS DVT Prophylaxis:   heparin drip  Family Communication: Discussed in detail with the patient, all imaging results, lab results explained to the patient and wife at the bedside  Disposition Plan:   Time Spent in minutes  25 minutes  Procedures:  2-D echo Study Conclusions  - Left ventricle: Septal and posterior lateral wall hypokinesis The   cavity size was mildly dilated. There was mild focal basal   hypertrophy of the septum. Systolic function was moderately   reduced. The estimated ejection fraction was in the range of 35%   to 40%. Doppler parameters are consistent with abnormal left   ventricular relaxation (grade 1 diastolic dysfunction). - Aortic valve: Likely trileaflet but not well seen - Atrial septum:  No defect or patent foramen ovale was identified.  Consultants:   Nephrology Cardiology  Antimicrobials:   None   Medications  Scheduled Meds: . amLODipine  5 mg Oral Daily  . carvedilol  3.125 mg Oral BID WC  . clopidogrel  75 mg Oral Daily  . [START ON 04/14/2016] darbepoetin (ARANESP) injection - DIALYSIS  60 mcg Intravenous Q Wed-HD  . doxercalciferol  3 mcg Intravenous Q M,W,F-HD  . ferric citrate  420 mg Oral TID  . [START ON 04/14/2016] ferric gluconate (FERRLECIT/NULECIT) IV  62.5 mg Intravenous Q Wed-HD  . insulin aspart  0-15 Units Subcutaneous TID WC  . insulin aspart  0-5 Units Subcutaneous QHS  . insulin detemir  17 Units Subcutaneous BID  . nitroGLYCERIN  1 inch Topical Q6H    . polyethylene glycol  17 g Oral Daily  . pravastatin  40 mg Oral Daily  . pregabalin  75 mg Oral Daily  . QUEtiapine  25 mg Oral Daily  . sodium chloride flush  3 mL Intravenous Q12H  . tamsulosin  0.4 mg Oral Daily   Continuous Infusions: . sodium chloride 10 mL/hr at 04/12/16 0658  . heparin 1,200 Units/hr (04/12/16 0657)   PRN Meds:.sodium chloride, acetaminophen **OR** acetaminophen, bisacodyl, HYDROcodone-acetaminophen, ondansetron **OR** ondansetron (ZOFRAN) IV, sodium chloride flush   Antibiotics   Anti-infectives    None        Subjective:   Lotus Gover was seen and examined today.  Denies any chest pain. Wife at the bedside. Patient denies dizziness, abdominal pain, N/V/D/C, new weakness, numbess, tingling. No acute events overnight.    Objective:   Vitals:   04/12/16 1135 04/12/16 1200 04/12/16 1215 04/12/16 1220  BP: (!) 85/36 (!) 81/53 (!) 77/41 (!) 88/54  Pulse: 64 66 66 64  Resp:      Temp:      TempSrc:      SpO2:      Weight:        Intake/Output Summary (Last 24 hours) at 04/12/16 1251 Last data filed at 04/12/16 6834  Gross per 24 hour  Intake              876 ml  Output                0 ml  Net              876 ml     Wt Readings from Last 3 Encounters:  04/12/16 74.1 kg (163 lb 5.8 oz)  10/30/15 77.1 kg (170 lb)  05/05/15 77.3 kg (170 lb 6.7 oz)     Exam  General: Alert and oriented x 3, NAD   HEENT:    Neck: Supple, no JVD  Cardiovascular: S1 S2 Clear, RRR  Respiratory: CTAB  Gastrointestinal: Soft, nontender, nondistended, + bowel sounds  Ext: no cyanosis clubbing, bilateral BKA, left UE AVF   Neuro: nonfocal  Skin: No rashes  Psych: Normal affect and demeanor, alert and oriented x3    Data Reviewed:  I have personally reviewed following labs and imaging studies  Micro Results No results found for this or any previous visit (from the past 240 hour(s)).  Radiology Reports Portable Chest 1 View  Result  Date: 04/10/2016 CLINICAL DATA:  Pulmonary edema. EXAM: PORTABLE CHEST 1 VIEW COMPARISON:  April 09, 2016 FINDINGS: Stable mild cardiomegaly. The hila and mediastinum are unchanged. No pneumothorax. Diffuse interstitial opacities seen previously have improved. More focal opacities are seen in the bases, right greater  than left. There is a platelike appearance of these opacities on the left, but not on the right. IMPRESSION: 1. Improved pulmonary edema. 2. More focal opacities are seen in the bases, possibly representing developing infiltrate on the right and atelectasis on the left. Recommend clinical correlation and follow-up to resolution. Electronically Signed   By: Dorise Bullion III M.D   On: 04/10/2016 07:28   Dg Chest Port 1 View  Result Date: 04/09/2016 CLINICAL DATA:  Shortness of Breath EXAM: PORTABLE CHEST 1 VIEW COMPARISON:  11/06/2014 FINDINGS: Interval removal of right dialysis catheter. Prior CABG. Heart is borderline in size. There is diffuse interstitial prominence and vascular congestion. This may reflect mild interstitial edema. No effusions. No acute bony abnormality. IMPRESSION: Vascular congestion with increasing interstitial prominence, possibly interstitial edema. Electronically Signed   By: Rolm Baptise M.D.   On: 04/09/2016 09:19    Lab Data:  CBC:  Recent Labs Lab 04/09/16 0903 04/09/16 1214 04/10/16 0341 04/11/16 0432 04/12/16 0523  WBC 12.4* 11.4* 10.3 7.9 7.6  NEUTROABS 10.0*  --   --   --   --   HGB 11.4* 10.7* 10.4* 9.6* 9.3*  HCT 34.2* 33.1* 32.2* 29.7* 28.8*  MCV 94.2 95.7 95.5 97.4 95.7  PLT 205 219 224 188 585   Basic Metabolic Panel:  Recent Labs Lab 04/09/16 0903 04/09/16 1214 04/10/16 0341 04/12/16 0523  NA 134* 134* 134* 132*  K 4.0 3.7 3.4* 3.4*  CL 95* 96* 92* 92*  CO2 25 26 29 29   GLUCOSE 365* 337* 174* 204*  BUN 19 20 13  29*  CREATININE 5.11* 5.19* 4.00* 6.42*  CALCIUM 10.3 9.9 9.4 9.0  PHOS  --  3.9  --   --    GFR: Estimated  Creatinine Clearance: 9.5 mL/min (A) (by C-G formula based on SCr of 6.42 mg/dL (H)). Liver Function Tests:  Recent Labs Lab 04/09/16 1214  ALBUMIN 3.5   No results for input(s): LIPASE, AMYLASE in the last 168 hours. No results for input(s): AMMONIA in the last 168 hours. Coagulation Profile:  Recent Labs Lab 04/12/16 0523  INR 0.99   Cardiac Enzymes:  Recent Labs Lab 04/10/16 1017 04/10/16 1507 04/10/16 2236  TROPONINI 4.26* 3.56* 2.46*   BNP (last 3 results) No results for input(s): PROBNP in the last 8760 hours. HbA1C:  Recent Labs  04/11/16 0432  HGBA1C 10.7*   CBG:  Recent Labs Lab 04/11/16 1147 04/11/16 1518 04/11/16 1639 04/11/16 2158 04/12/16 0804  GLUCAP 56* 78 105* 186* 167*   Lipid Profile: No results for input(s): CHOL, HDL, LDLCALC, TRIG, CHOLHDL, LDLDIRECT in the last 72 hours. Thyroid Function Tests: No results for input(s): TSH, T4TOTAL, FREET4, T3FREE, THYROIDAB in the last 72 hours. Anemia Panel: No results for input(s): VITAMINB12, FOLATE, FERRITIN, TIBC, IRON, RETICCTPCT in the last 72 hours. Urine analysis:    Component Value Date/Time   COLORURINE YELLOW 10/15/2014 1344   APPEARANCEUR CLEAR 10/15/2014 1344   APPEARANCEUR Turbid (A) 07/12/2014 1644   LABSPEC 1.017 10/15/2014 1344   PHURINE 6.0 10/15/2014 1344   GLUCOSEU >1000 (A) 10/15/2014 1344   HGBUR SMALL (A) 10/15/2014 1344   BILIRUBINUR NEGATIVE 10/15/2014 1344   BILIRUBINUR Negative 07/12/2014 1644   KETONESUR NEGATIVE 10/15/2014 1344   PROTEINUR >300 (A) 10/15/2014 1344   UROBILINOGEN 0.2 10/15/2014 1344   NITRITE NEGATIVE 10/15/2014 1344   LEUKOCYTESUR NEGATIVE 10/15/2014 1344   LEUKOCYTESUR 3+ (A) 07/12/2014 1644     Mitsugi Schrader M.D. [facility] 04/12/2016, 12:51 PM  Pager: [phone] Between 7am to 7pm - call Pager - [phone]  After 7pm go to [email or web address] - password [email or web address]  Call night coverage person covering after 7pm

## 2016-04-12 NOTE — Progress Notes (Signed)
Pt. With dried blood noted on his sheets post cath. RN held pressure while assessing pt. For active bleeding tp Left femoral site with assistance of 2 RNs. No active bleed noted. Pt. Alert and stable. Site soft, no hematoma noted. VSS. Site reinforced with tegaderm and guaze dressing. Call placed to cath lab to notify unsuccessful. RN will continue to monitor pt. For changes in condition. Talmadge Ganas, Katherine Roan

## 2016-04-12 NOTE — Interval H&P Note (Signed)
History and Physical Interval Note:  04/12/2016 5:07 PM  Aaron Long  has presented today for surgery, with the diagnosis of unstable angina  The various methods of treatment have been discussed with the patient and family. After consideration of risks, benefits and other options for treatment, the patient has consented to  Procedure(s): Left Heart Cath and Coronary Angiography (N/A) as a surgical intervention .  [name] history has been reviewed, patient examined, no change in status, stable for surgery.  I have reviewed the patient's chart and labs.  Questions were answered to the patient's satisfaction.    Cath Lab Visit (complete for each Cath Lab visit)  Clinical Evaluation Leading to the Procedure:   ACS: Yes.    Non-ACS:    Anginal Classification: CCS IV  Anti-ischemic medical therapy: Minimal Therapy (1 class of medications)  Non-Invasive Test Results: Intermediate-risk stress test findings: cardiac mortality 1-3%/year  Prior CABG: Previous CABG   Kymberlyn Eckford

## 2016-04-12 NOTE — Progress Notes (Signed)
Cardiology called back stated to keep pt NPO, stated pt will have cardiac cath today. Family aware   Aaron Long

## 2016-04-12 NOTE — Progress Notes (Signed)
5 French sheath was removed from the patient's LFA using manual pressure which was held for 20 minutes.  There were no complications during hold and patient tolerated well.  Site looked unremarkable and no hematoma noted after the hold was completed.  Post cath orders were given to the patient and he understood.  PVS: BP 133/56; HR 69; O2 SATS 99% RA.

## 2016-04-12 NOTE — Progress Notes (Signed)
Pt groin prepped by nurse tech  Nilda Simmer

## 2016-04-12 NOTE — Brief Op Note (Signed)
Date: 04/12/2016 Time: 5:59 PM  PATIENT:  [name]  76 y.o. male  PRE-OPERATIVE DIAGNOSIS:  Acute on chronic systolic and diastolic heart failure, unstable angina  POST-OPERATIVE DIAGNOSIS:  Same  PROCEDURE:  Procedure(s): Left Heart Cath and Coronary Angiography (N/A)  SURGEON:  Surgeon(s) and Role:    * Nelva Bush, MD - Primary  FINDINGS: 1. Severe native CAD, including chronic total occlusions of proximal LAD, ostial LCx, and mid RCA. 2. All bypass grafts widely patent (LIMA->LAD, SVG->diagonal, SVG->OM, and SVG->PDA). There is 80% stenosis OM proximal to SVG anastomosis, which backfills the distal LCx. 3. Moderately elevated LVEDP (30 mmHg). 4. Severely reduced LV contraction (LVEF ~25%).  RECOMMENDATIONS: 1. Aggressive medical therapy, including additional fluid removal by hemodialysis. 2. Consider addition of beta blocker and/or long-acting nitrate (in addition to other evidence-based heart failure therapies).  Nelva Bush, MD [facility] Pager: [phone]

## 2016-04-12 NOTE — Progress Notes (Signed)
Pt family packed up all pt belongings and took with them to the cath waiting room.   Aaron Long

## 2016-04-12 NOTE — Progress Notes (Signed)
Pt states he is starving. Pt wife states pt is becoming agitated. Called cardiac cath lab, stated there was an emergancy and there is no time frame for procedure. Paged cardiology regarding pt cardiac cath. Cardiologist stated he would call back after reviewing chart.   Aaron Long Aaron Long

## 2016-04-12 NOTE — Progress Notes (Signed)
ANTICOAGULATION CONSULT NOTE - Follow Up Consult  Pharmacy Consult for Heparin Indication: chest pain/ACS  Allergies  Allergen Reactions  . Oxycodone Other (See Comments)    Hallucinations    Patient Measurements: Weight: 163 lb (73.9 kg)  Vital Signs: Temp: 98 F (36.7 C) (04/02 0743) Temp Source: Oral (04/02 0743) BP: 140/94 (04/02 0743) Pulse Rate: 76 (04/02 0743)  Labs:  Recent Labs  04/09/16 1214  04/10/16 0341 04/10/16 1017 04/10/16 1507 04/10/16 2236 04/11/16 0432 04/11/16 0443 04/12/16 0523  HGB 10.7*  --  10.4*  --   --   --  9.6*  --  9.3*  HCT 33.1*  --  32.2*  --   --   --  29.7*  --  28.8*  PLT 219  --  224  --   --   --  188  --  210  LABPROT  --   --   --   --   --   --   --   --  13.1  INR  --   --   --   --   --   --   --   --  0.99  HEPARINUNFRC  --   < > 0.23*  --   --   --   --  0.39 0.44  CREATININE 5.19*  --  4.00*  --   --   --   --   --  6.42*  TROPONINI  --   --   --  4.26* 3.56* 2.46*  --   --   --   < > = values in this interval not displayed.  Estimated Creatinine Clearance: 9.5 mL/min (A) (by C-G formula based on SCr of 6.42 mg/dL (H)).  Assessment: 78 yoM presents for ACS rule out to start on heparin gtt.  Heparin level therapeutic this AM CBC stable  Goal of Therapy:  Heparin level 0.3-0.7 units/ml Monitor platelets by anticoagulation protocol: Yes   Plan:  -Continue heparin 1200 units/hr -Follow up after cath  Thank you Anette Guarneri, PharmD [phone] 04/12/2016

## 2016-04-12 NOTE — Progress Notes (Signed)
Cath lab called stated they will send for pt in 10 minutes. Family and pt aware.   Aaron Long

## 2016-04-12 NOTE — H&P (View-Only) (Signed)
Progress Note  Patient Name: GERRY BLANCHFIELD Date of Encounter: 04/12/2016  Primary Cardiologist: Angelena Form  Subjective   Pt sleeping comfortably    Inpatient Medications    Scheduled Meds: . amLODipine  5 mg Oral Daily  . carvedilol  3.125 mg Oral BID WC  . clopidogrel  75 mg Oral Daily  . doxercalciferol  3 mcg Intravenous Q M,W,F-HD  . ferric citrate  420 mg Oral TID  . [START ON 04/14/2016] ferric gluconate (FERRLECIT/NULECIT) IV  62.5 mg Intravenous Q Wed-HD  . insulin aspart  0-15 Units Subcutaneous TID WC  . insulin aspart  0-5 Units Subcutaneous QHS  . insulin detemir  17 Units Subcutaneous BID  . nitroGLYCERIN  1 inch Topical Q6H  . polyethylene glycol  17 g Oral Daily  . pravastatin  40 mg Oral Daily  . pregabalin  75 mg Oral Daily  . QUEtiapine  25 mg Oral Daily  . sodium chloride flush  3 mL Intravenous Q12H  . tamsulosin  0.4 mg Oral Daily   Continuous Infusions: . sodium chloride 10 mL/hr at 04/12/16 0658  . heparin 1,200 Units/hr (04/12/16 0657)   PRN Meds: sodium chloride, acetaminophen **OR** acetaminophen, bisacodyl, HYDROcodone-acetaminophen, ondansetron **OR** ondansetron (ZOFRAN) IV, sodium chloride flush   Vital Signs    Vitals:   04/11/16 1521 04/11/16 1842 04/11/16 2013 04/12/16 0743  BP: 121/60  118/75 (!) 140/94  Pulse: 64  71 76  Resp: 18  18 18   Temp: 98.1 F (36.7 C)  98 F (36.7 C) 98 F (36.7 C)  TempSrc: Oral  Oral Oral  SpO2: 100% 99% 100% 97%  Weight:    163 lb (73.9 kg)    Intake/Output Summary (Last 24 hours) at 04/12/16 0817 Last data filed at 04/12/16 0300  Gross per 24 hour  Intake             1116 ml  Output                0 ml  Net             1116 ml   Filed Weights   04/10/16 1630 04/11/16 0615 04/12/16 0743  Weight: 156 lb 8.4 oz (71 kg) 156 lb 8.4 oz (71 kg) 163 lb (73.9 kg)    Telemetry    NSR - Personally Reviewed  Physical Exam  Comfiortable  Sleeping  GEN: No acute distress.   Neck: No  JVD Cardiac: RRR, no murmurs, rubs, or gallops Respiratory: Clear to auscultation bilaterally anteriorly   GI: Soft, nontender, non-distended  MS: No edema; S/P Bilat BKA   Labs    Chemistry  Recent Labs Lab 04/09/16 1214 04/10/16 0341 04/12/16 0523  NA 134* 134* 132*  K 3.7 3.4* 3.4*  CL 96* 92* 92*  CO2 26 29 29   GLUCOSE 337* 174* 204*  BUN 20 13 29*  CREATININE 5.19* 4.00* 6.42*  CALCIUM 9.9 9.4 9.0  ALBUMIN 3.5  --   --   GFRNONAA 10* 13* 7*  GFRAA 11* 15* 9*  ANIONGAP 12 13 11      Hematology  Recent Labs Lab 04/10/16 0341 04/11/16 0432 04/12/16 0523  WBC 10.3 7.9 7.6  RBC 3.37* 3.05* 3.01*  HGB 10.4* 9.6* 9.3*  HCT 32.2* 29.7* 28.8*  MCV 95.5 97.4 95.7  MCH 30.9 31.5 30.9  MCHC 32.3 32.3 32.3  RDW 14.8 15.2 15.2  PLT 224 188 210    Cardiac Enzymes  Recent Labs Lab 04/10/16 1017 04/10/16  1507 04/10/16 2236  TROPONINI 4.26* 3.56* 2.46*     Recent Labs Lab 04/09/16 0913  TROPIPOC 1.85*     BNPNo results for input(s): BNP, PROBNP in the last 168 hours.   DDimer No results for input(s): DDIMER in the last 168 hours.   Radiology    No results found.  Cardiac Studies   Preliminary review of bedside echo shows new wall motion abnormality (marked hypokinesis of the basal-mid anterior wall and anterior septum with preserved apical wall motion, EF around 40%)  Patient Profile     76 y.o. male with CAD, CABG 2007, patent grafts at cath 2016, ESRD on MD MWF, PAD s/p Bilat BKA presents with acute pulmonary edema, new ECG changes, small NSTEMI by enzymes and echo suggestive of LAD lesion with patent LIMA to distal vessel.  Assessment & Plan    1. Acute (systolic and diastolic) heart failure -  Going to dialysis now  Then will have L heart cath   2. CAD s/p CABG  Now with NSTEMI - Plan for cath after dialysis 3.  4. ESRD on HD - Dialysis today 5.  6. PAD s/p bilat BKA  This procedure has been fully reviewed with the patient and his family  and written informed consent has been obtained.   Signed, Dorris Carnes, MD  04/12/2016, 8:17 AM

## 2016-04-12 NOTE — Progress Notes (Signed)
Wife at bedside. Pt and wife asking questions in regards to procedures today. Hemodialysis called, report given. HD stated pt will be pulled down at 0830. MD Rai at bedside. MD aware pt going for HD prior to cath. Cath lab called, RN stated they do not have a scheduled time for procedure. Will continue to monitor  Aaron Long Leory Plowman

## 2016-04-12 NOTE — Progress Notes (Signed)
Cath lab transporter aware pt blood sugar was 79 at 1424, pt alert and oriented. Heparin drip stopped when transporter at bedside  Mosi Hannold Leory Plowman

## 2016-04-12 NOTE — Progress Notes (Signed)
[facility] Progress Note   Dialysis Orders: Center: [facility]   on MWF . EDW 72.5  HD Bath 2k,2.25ca  Time 4hr Heparin 2200. Access LUA AVF     Hec 7 mcg IV/HD  Mircera 30mcg  q 4weeks  (last given 04/05/16)     Venofer  50 mg qwkly hd  Other  Op labs last week = hgb 10.5/ Ca 10.3/ phos 3.5 / pth 243   Assessment/Plan: 1. Pulm edema / resp distress - not sure vol excess vs ischemia. For heart cath after HD today.  serial HD last week with wt down to 71 - bed scale 1.5 below edw Not getting any vol off today due to hypotension. - 2. ESRD -MWF gained 3 L over the weekend K 3.4 today - change to 4 K bath 3. Anemia - hgb 9.3 trending down - resume Aranesp - 60 per week on Wed weekly venofer 4. Secondary hyperparathyroidism - hectorol reduced to 3 upon admission due to ^ Ca and decline in outpt iPTH 5. HTN/volume - BP drop during HD today - norvasc 5 -(and low dose carvedilol - net UF 4 L 3/30 and 1.5 L 3/31 down to 71- pre HD wt- pre weight today 74.1 goal 3 L 6. Nutrition -npo - for procedure post HD  7. CAD - NSTEMI - for left heart cath post HD today 8. DM -BS ok < 200 9. Hx bipolar d/o on seroquel  Myriam Jacobson, PA-C [facility] Beeper [phone] 04/12/2016,11:17 AM  LOS: 2 days   Pt seen, examined and agree w A/P as above.  Kelly Splinter MD [facility] pager [phone]   04/12/2016, 12:22 PM    Subjective:   No c/o  Objective Vitals:   04/11/16 2013 04/12/16 0743 04/12/16 0945 04/12/16 0955  BP: 118/75 (!) 140/94 (!) 100/39 100/62  Pulse: 71 76 68 69  Resp: 18 18 12 16   Temp: 98 F (36.7 C) 98 F (36.7 C) 98.1 F (36.7 C)   TempSrc: Oral Oral Oral   SpO2: 100% 97% 95%   Weight:  73.9 kg (163 lb) 74.1 kg (163 lb 5.8 oz)    Physical Exam General: NAD on room air Heart: RRR Lungs: no rales anteriorly Abdomen: soft NT Extremities: bilateral bka no edema Dialysis Access:  Left upper AVF   Additional  Objective Labs: Basic Metabolic Panel:  Recent Labs Lab 04/09/16 1214 04/10/16 0341 04/12/16 0523  NA 134* 134* 132*  K 3.7 3.4* 3.4*  CL 96* 92* 92*  CO2 26 29 29   GLUCOSE 337* 174* 204*  BUN 20 13 29*  CREATININE 5.19* 4.00* 6.42*  CALCIUM 9.9 9.4 9.0  PHOS 3.9  --   --    Liver Function Tests:  Recent Labs Lab 04/09/16 1214  ALBUMIN 3.5   CBC:  Recent Labs Lab 04/09/16 0903 04/09/16 1214 04/10/16 0341 04/11/16 0432 04/12/16 0523  WBC 12.4* 11.4* 10.3 7.9 7.6  NEUTROABS 10.0*  --   --   --   --   HGB 11.4* 10.7* 10.4* 9.6* 9.3*  HCT 34.2* 33.1* 32.2* 29.7* 28.8*  MCV 94.2 95.7 95.5 97.4 95.7  PLT 205 219 224 188 210  Cardiac Enzymes:  Recent Labs Lab 04/10/16 1017 04/10/16 1507 04/10/16 2236  TROPONINI 4.26* 3.56* 2.46*   CBG:  Recent Labs Lab 04/11/16 1147 04/11/16 1518 04/11/16 1639 04/11/16 2158 04/12/16 0804  GLUCAP 56* 78 105* 186* 167*    Lab Results  Component  Value Date   INR 0.99 04/12/2016   INR 1.06 05/01/2015   INR 0.94 12/12/2014   Studies/Results: No results found. Medications: . sodium chloride 10 mL/hr at 04/12/16 0658  . heparin 1,200 Units/hr (04/12/16 0657)   . amLODipine  5 mg Oral Daily  . carvedilol  3.125 mg Oral BID WC  . clopidogrel  75 mg Oral Daily  . doxercalciferol  3 mcg Intravenous Q M,W,F-HD  . ferric citrate  420 mg Oral TID  . [START ON 04/14/2016] ferric gluconate (FERRLECIT/NULECIT) IV  62.5 mg Intravenous Q Wed-HD  . insulin aspart  0-15 Units Subcutaneous TID WC  . insulin aspart  0-5 Units Subcutaneous QHS  . insulin detemir  17 Units Subcutaneous BID  . nitroGLYCERIN  1 inch Topical Q6H  . polyethylene glycol  17 g Oral Daily  . pravastatin  40 mg Oral Daily  . pregabalin  75 mg Oral Daily  . QUEtiapine  25 mg Oral Daily  . sodium chloride flush  3 mL Intravenous Q12H  . tamsulosin  0.4 mg Oral Daily

## 2016-04-13 ENCOUNTER — Encounter (HOSPITAL_COMMUNITY): Payer: Self-pay | Admitting: Internal Medicine

## 2016-04-13 LAB — BASIC METABOLIC PANEL
ANION GAP: 8 (ref 5–15)
BUN: 21 mg/dL — ABNORMAL HIGH (ref 6–20)
CHLORIDE: 94 mmol/L — AB (ref 101–111)
CO2: 27 mmol/L (ref 22–32)
CREATININE: 5.08 mg/dL — AB (ref 0.61–1.24)
Calcium: 8.5 mg/dL — ABNORMAL LOW (ref 8.9–10.3)
GFR calc non Af Amer: 10 mL/min — ABNORMAL LOW (ref >60)
GFR, EST AFRICAN AMERICAN: 12 mL/min — AB (ref >60)
Glucose, Bld: 270 mg/dL — ABNORMAL HIGH (ref 65–99)
Potassium: 3.7 mmol/L (ref 3.5–5.1)
SODIUM: 129 mmol/L — AB (ref 135–145)

## 2016-04-13 LAB — CBC
HCT: 30.6 % — ABNORMAL LOW (ref 39.0–52.0)
HEMOGLOBIN: 10 g/dL — AB (ref 13.0–17.0)
MCH: 31.8 pg (ref 26.0–34.0)
MCHC: 32.7 g/dL (ref 30.0–36.0)
MCV: 97.5 fL (ref 78.0–100.0)
Platelets: 218 10*3/uL (ref 150–400)
RBC: 3.14 MIL/uL — AB (ref 4.22–5.81)
RDW: 15.7 % — ABNORMAL HIGH (ref 11.5–15.5)
WBC: 6.9 10*3/uL (ref 4.0–10.5)

## 2016-04-13 LAB — GLUCOSE, CAPILLARY
GLUCOSE-CAPILLARY: 207 mg/dL — AB (ref 65–99)
GLUCOSE-CAPILLARY: 51 mg/dL — AB (ref 65–99)
Glucose-Capillary: 171 mg/dL — ABNORMAL HIGH (ref 65–99)
Glucose-Capillary: 66 mg/dL (ref 65–99)
Glucose-Capillary: 98 mg/dL (ref 65–99)

## 2016-04-13 MED ORDER — HYDRALAZINE HCL 25 MG PO TABS: 25.0000 mg | ORAL_TABLET | Freq: Three times a day (TID) | ORAL | 3 refills | Status: DC

## 2016-04-13 MED ORDER — HYDRALAZINE HCL 25 MG PO TABS
25.0000 mg | ORAL_TABLET | Freq: Three times a day (TID) | ORAL | Status: DC
Start: 2016-04-13 — End: 2016-04-13
  Administered 2016-04-13: 25 mg via ORAL
  Filled 2016-04-13: qty 1

## 2016-04-13 MED ORDER — ISOSORBIDE MONONITRATE ER 30 MG PO TB24: 30.0000 mg | ORAL_TABLET | Freq: Every day | ORAL | 3 refills | Status: AC

## 2016-04-13 MED ORDER — ISOSORBIDE MONONITRATE ER 30 MG PO TB24
30.0000 mg | ORAL_TABLET | Freq: Every day | ORAL | Status: DC
  Administered 2016-04-13: 30 mg via ORAL
  Filled 2016-04-13: qty 1

## 2016-04-13 MED ORDER — AMLODIPINE BESYLATE 5 MG PO TABS: 5.0000 mg | ORAL_TABLET | Freq: Every day | ORAL | 3 refills | Status: DC

## 2016-04-13 MED ORDER — CARVEDILOL 3.125 MG PO TABS: 3.1250 mg | ORAL_TABLET | Freq: Two times a day (BID) | ORAL | 3 refills | Status: DC

## 2016-04-13 NOTE — Discharge Summary (Signed)
Physician Discharge Summary   Patient ID: Aaron Long MRN: [id number] DOB/AGE: Apr 28, 1940 76 y.o.  Admit date: 04/09/2016 Discharge date: 04/13/2016  Primary Care Physician:  Hollace Kinnier, DO  Discharge Diagnoses:      Acute hypoxic respiratory failure with pulmonary edema resolved with hemodialysis   NSTEMI . HTN (hypertension) . Diabetes mellitus with peripheral vascular disease ([place]) . CAD (coronary artery disease) . Elevated troponin   Anemia of chronic disease   Consults:  Cardiology Nephrology  Recommendations for Outpatient Follow-up:  1. Patient will resume hemodialysis, per nephrology, plan to lower volume/dry weight in outpatient setting progressively until new dry weight is reached 2. Please repeat CBC/BMET at next visit   DIET: Carb modified diet    Allergies:   Allergies  Allergen Reactions  . Oxycodone Other (See Comments)    Hallucinations     DISCHARGE MEDICATIONS: Current Discharge Medication List    START taking these medications   Details  carvedilol (COREG) 3.125 MG tablet Take 1 tablet (3.125 mg total) by mouth 2 (two) times daily with a meal. Qty: 60 tablet, Refills: 3    hydrALAZINE (APRESOLINE) 25 MG tablet Take 1 tablet (25 mg total) by mouth every 8 (eight) hours. Qty: 90 tablet, Refills: 3    isosorbide mononitrate (IMDUR) 30 MG 24 hr tablet Take 1 tablet (30 mg total) by mouth daily. Qty: 30 tablet, Refills: 3      CONTINUE these medications which have CHANGED   Details  amLODipine (NORVASC) 5 MG tablet Take 1 tablet (5 mg total) by mouth daily. Take one tablet by mouth once daily Qty: 30 tablet, Refills: 3      CONTINUE these medications which have NOT CHANGED   Details  ASPIRIN LOW DOSE 81 MG EC tablet TAKE 1 TABLET BY MOUTH EVERY MORNING Qty: 90 tablet, Refills: 10    AURYXIA 1 GM 210 MG(Fe) tablet Take 420 mg by mouth 3 (three) times daily. Refills: 0    clopidogrel (PLAVIX) 75 MG tablet Take one tablet by  mouth once daily Qty: 30 tablet, Refills: 0    HYDROcodone-acetaminophen (NORCO/VICODIN) 5-325 MG tablet Take 1 tablet by mouth every 6 (six) hours as needed for moderate pain. Qty: 120 tablet, Refills: 0   Associated Diagnoses: Post-op pain    insulin aspart (NOVOLOG) 100 UNIT/ML FlexPen Inject 5 Units into the skin 3 (three) times daily with meals. Inject 5 units three times a day with meals if blood sugar greater than 150 only, DX: 250.71 Qty: 15 mL, Refills: 0    Insulin Detemir (LEVEMIR FLEXTOUCH) 100 UNIT/ML Pen INJECT 17 UNITS SUBCUTANEOUSLY TWICE DAILY Qty: 15 mL, Refills: 0    polyethylene glycol (MIRALAX) packet Take 17 g by mouth daily. Qty: 30 each, Refills: 0    pravastatin (PRAVACHOL) 40 MG tablet Take one tablet by mouth once daily Qty: 30 tablet, Refills: 0    pregabalin (LYRICA) 75 MG capsule Take one tablet by mouth once daily for pains Qty: 30 capsule, Refills: 0   Associated Diagnoses: Phantom pain    QUEtiapine (SEROQUEL) 25 MG tablet Take 1 tablet (25 mg total) by mouth daily. Qty: 30 tablet, Refills: 0    tamsulosin (FLOMAX) 0.4 MG CAPS capsule Take one capsule by mouth once daily Qty: 30 capsule, Refills: 0    calcium acetate (PHOSLO) 667 MG capsule TAKE 1 CAPSULE BY MOUTH THREE TIMES DAILY WITH MEALS Qty: 90 capsule, Refills: 5         Brief H and P:  For complete details please refer to admission H and P, but in brief Patient is a 76 year old male with BPH, anemia, ESRD on HD MWF, diabetes, history of venous thromboembolism, CAD who presented with shortness of breath. Patient reported that he has not missed any dialysis. However per day before and the admission he started having shortness of breath otherwise no chest pain, nausea, vomiting or any diaphoresis.  ED course: Chest x-ray showed interstitial edema pulmonary and patient was given Nitropaste. Nephrology and cardiology were consulted. Patient had one set of troponin point of care +1.85, was  started on heparin.  Hospital Course:   Acute diastolic CHF (congestive heart failure) (HCC)/Volume overload with interstitial pulmonary edema and in the setting of ESRD on hemodialysis - 2-D echo in 09/2014 had shown EF of 50-55% with grade 1 diastolic dysfunction - Chest x-ray showed interstitial pulmonary edema, troponin point of care elevated - Patient was started on heparin drip and underwent urgent hemodialysis. 2-D echo 4/1 showed EF of 35-40% with grade 1 diastolic dysfunction - Patient underwent cardiac cath which showed severe native CAD including chronic total occlusion of proximal LAD, mid LAD, ostial LCx  And id RCA. All bypass grafts widely patent, moderately elevated LVEDP, EF 25%. Patient was recommended aggressive medical therapy including fluid removal by hemodialysis.  - Cardiology recommended addition of beta blocker and long-acting nitrate. He has a outpatient follow-up with cardiology scheduled. -  Nephrology recommended lowering volume/dry weight in outpatient setting progressively until new dry weight is reached  NSTEMI - Possibly due to volume overload and acute pulmonary edema. However patient has underlying history of CAD - Only one troponin POC +1.85, troponin 4.26, serial troponins trending down - No chest pain, patient was started on heparin drip and cardiology was consulted - Continue Plavix, statin 2-D echo showed EF of 35-40% with grade 1 diastolic dysfunction - Cardiac cath howed severe native CAD including chronic total occlusion of proximal LAD, mid LAD, ostial LCx  And id RCA. All bypass grafts widely patent, moderately elevated LVEDP, EF 25%. Patient was recommended aggressive medical therapy including fluid removal by hemodialysis. Patient was started on beta blocker and long-acting nitrate.  ESRD on hemodialysis [facility]) MWF - Nephrology was consulted, chest x-ray showed interstitial pulmonary edema - Patient underwent hemodialysis per schedule, extra HD on  3/31. - Patient does not want to do extra HD on the day of discharge, per nephrology plan is to lower volume/ lower dry wt in OP setting progressively until new dry wt reached.  OK for dc from renal standpoint.     HTN (hypertension) - BP currently stable, continue HD and continue amlodipine, started on beta blocker, Imdur    Diabetes mellitus with peripheral vascular disease (HCC) - Continue Levemir and NovoLog   Day of Discharge BP 138/69 (BP Location: Right Arm)   Pulse 87   Temp 99.6 F (37.6 C) (Oral)   Resp 18   Ht 5\' 8"  (1.727 m) Comment: prior to BKA sx  Wt 72.9 kg (160 lb 11.5 oz)   SpO2 99%   BMI 24.44 kg/m   Physical Exam: General: Alert and awake oriented x3 not in any acute distress. HEENT: anicteric sclera, pupils reactive to light and accommodation CVS: S1-S2 clear no murmur rubs or gallops Chest: clear to auscultation bilaterally, no wheezing rales or rhonchi Abdomen: soft nontender, nondistended, normal bowel sounds Extremities: no cyanosis, clubbing, bilateral BKA    The results of significant diagnostics from this hospitalization (including imaging, microbiology, ancillary and  laboratory) are listed below for reference.    LAB RESULTS: Basic Metabolic Panel:  Recent Labs Lab 04/09/16 1214  04/12/16 0523 04/13/16 0421  NA 134*  < > 132* 129*  K 3.7  < > 3.4* 3.7  CL 96*  < > 92* 94*  CO2 26  < > 29 27  GLUCOSE 337*  < > 204* 270*  BUN 20  < > 29* 21*  CREATININE 5.19*  < > 6.42* 5.08*  CALCIUM 9.9  < > 9.0 8.5*  PHOS 3.9  --   --   --   < > = values in this interval not displayed. Liver Function Tests:  Recent Labs Lab 04/09/16 1214  ALBUMIN 3.5   No results for input(s): LIPASE, AMYLASE in the last 168 hours. No results for input(s): AMMONIA in the last 168 hours. CBC:  Recent Labs Lab 04/09/16 0903  04/12/16 0523 04/13/16 0421  WBC 12.4*  < > 7.6 6.9  NEUTROABS 10.0*  --   --   --   HGB 11.4*  < > 9.3* 10.0*  HCT 34.2*  < >  28.8* 30.6*  MCV 94.2  < > 95.7 97.5  PLT 205  < > 210 218  < > = values in this interval not displayed. Cardiac Enzymes:  Recent Labs Lab 04/10/16 1507 04/10/16 2236  TROPONINI 3.56* 2.46*   BNP: Invalid input(s): POCBNP CBG:  Recent Labs Lab 04/13/16 0759 04/13/16 1127  GLUCAP 207* 171*    Significant Diagnostic Studies:  Portable Chest 1 View  Result Date: 04/10/2016 CLINICAL DATA:  Pulmonary edema. EXAM: PORTABLE CHEST 1 VIEW COMPARISON:  April 09, 2016 FINDINGS: Stable mild cardiomegaly. The hila and mediastinum are unchanged. No pneumothorax. Diffuse interstitial opacities seen previously have improved. More focal opacities are seen in the bases, right greater than left. There is a platelike appearance of these opacities on the left, but not on the right. IMPRESSION: 1. Improved pulmonary edema. 2. More focal opacities are seen in the bases, possibly representing developing infiltrate on the right and atelectasis on the left. Recommend clinical correlation and follow-up to resolution. Electronically Signed   By: Dorise Bullion III M.D   On: 04/10/2016 07:28   Dg Chest Port 1 View  Result Date: 04/09/2016 CLINICAL DATA:  Shortness of Breath EXAM: PORTABLE CHEST 1 VIEW COMPARISON:  11/06/2014 FINDINGS: Interval removal of right dialysis catheter. Prior CABG. Heart is borderline in size. There is diffuse interstitial prominence and vascular congestion. This may reflect mild interstitial edema. No effusions. No acute bony abnormality. IMPRESSION: Vascular congestion with increasing interstitial prominence, possibly interstitial edema. Electronically Signed   By: Rolm Baptise M.D.   On: 04/09/2016 09:19    2D ECHO: Study Conclusions  - Left ventricle: Septal and posterior lateral wall hypokinesis The   cavity size was mildly dilated. There was mild focal basal   hypertrophy of the septum. Systolic function was moderately   reduced. The estimated ejection fraction was in the  range of 35%   to 40%. Doppler parameters are consistent with abnormal left   ventricular relaxation (grade 1 diastolic dysfunction). - Aortic valve: Likely trileaflet but not well seen - Atrial septum: No defect or patent foramen ovale was identified.  Procedures   Left Heart Cath and Coronary Angiography  Conclusion   Conclusions: 1. Severe native coronary artery disease, including chronic total occlusions of the proximal LAD, mid LAD, ostial LCx, and mid RCA. 2. All bypass grafts widely patent (LIMA->LAD, SVG->diagonal, SVG->OM,  and SVG->PDA). There is 80% stenosis involving the grafted OM proximal to SVG anastomosis, which backfills the distal LCx. 3. Moderately elevated LVEDP (30 mmHg). 4. Severely reduced LV contraction (LVEF ~25%).  Conclusions: 1. Aggressive medical therapy, including additional fluid removal by hemodialysis. 2. Consider addition of beta blocker and/or long-acting nitrate (in addition to other evidence-based heart failure therapies).  Nelva Bush, MD [facility]      Disposition and Follow-up: Discharge Instructions    (HEART FAILURE PATIENTS) Call MD:  Anytime you have any of the following symptoms: 1) 3 pound weight gain in 24 hours or 5 pounds in 1 week 2) shortness of breath, with or without a dry hacking cough 3) swelling in the hands, feet or stomach 4) if you have to sleep on extra pillows at night in order to breathe.    Complete by:  As directed    Diet - low sodium heart healthy    Complete by:  As directed    Increase activity slowly    Complete by:  As directed        DISPOSITION: [place] Croitor[place], MD. Schedule an appointment as soon as possible for a visit on 04/26/2016.   Specialty:  Cardiology Why:  @9 :30 Contact information: [address] [place] [address] [phone]            Time spent on Discharge: 29 mins   Signed:   Jaidin Richison  M.D. [facility] 04/13/2016, 12:34 PM Pager: [phone]

## 2016-04-13 NOTE — Progress Notes (Signed)
Orders received for pt discharge.  Discharge summary printed and reviewed with pt.  Explained medication regimen, and pt had no further questions at this time.  IV removed and site remains clean, dry, intact.  Telemetry removed.  Pt in stable condition and awaiting transport. 

## 2016-04-13 NOTE — Progress Notes (Signed)
[facility] Progress Note   Subjective: no c/o, no SOB  Vitals:   04/12/16 1923 04/12/16 1945 04/12/16 2000 04/13/16 0635  BP: 131/63 (!) 152/88 139/74 138/69  Pulse: 76   87  Resp:    18  Temp: 98.5 F (36.9 C)   99.6 F (37.6 C)  TempSrc: Oral   Oral  SpO2: 100%   99%  Weight:    72.9 kg (160 lb 11.5 oz)    Inpatient medications: . amLODipine  5 mg Oral Daily  . carvedilol  3.125 mg Oral BID WC  . clopidogrel  75 mg Oral Daily  . [START ON 04/14/2016] darbepoetin (ARANESP) injection - DIALYSIS  60 mcg Intravenous Q Wed-HD  . doxercalciferol  3 mcg Intravenous Q M,W,F-HD  . ferric citrate  420 mg Oral TID  . [START ON 04/14/2016] ferric gluconate (FERRLECIT/NULECIT) IV  62.5 mg Intravenous Q Wed-HD  . heparin  5,000 Units Subcutaneous Q8H  . hydrALAZINE  25 mg Oral Q8H  . insulin aspart  0-15 Units Subcutaneous TID WC  . insulin aspart  0-5 Units Subcutaneous QHS  . insulin detemir  17 Units Subcutaneous BID  . isosorbide mononitrate  30 mg Oral Daily  . polyethylene glycol  17 g Oral Daily  . pravastatin  40 mg Oral Daily  . pregabalin  75 mg Oral Daily  . QUEtiapine  25 mg Oral Daily  . sodium chloride flush  3 mL Intravenous Q12H  . tamsulosin  0.4 mg Oral Daily    sodium chloride, acetaminophen **OR** acetaminophen, bisacodyl, HYDROcodone-acetaminophen, ondansetron **OR** ondansetron (ZOFRAN) IV, sodium chloride flush  Exam: Alert, calm, lying flat No jvd Chest clear bilat RRR no RG ABd no ascites Ext no edema, bilat BKA LUA AVF  Dialysis: [facility]  MWF 4h  72.5kg  2/2.25  Hehp 2200   LUA AVF Hec 51mcg IV/HD Mircera 56mcg q 4weeks (last given 04/05/16)Venofer 50 mg qwkly hd  Other Op labs last week = hgb 10.5/ Ca 10.3/ phos 3.5 / pth 243      Assessment: 1. Pulm edema/ resp distress - resolved with HD.   2. NSTEMI/ ^trop - peak trop 4.6, heart cath all grafts are open.  Filling pressures were high. 3. ESRD MWF HD 4. Anemia cont  esa 5. MBD - hect decreased 3 ug  6. HTN - stable 7. DM per primary 8. Bipolar d/o - seroquel 9. Volume - at dry wt, asymptomatic now but w/ high filling pressures at cath  Plan - doesn't want to do extra HD today, pt asymptomatic now.  Plan is we will lower volume/ lower dry wt in OP setting progressively until new dry wt reached.  OK for dc from renal standpoint.    Kelly Splinter MD [facility] pager [phone]   04/13/2016, 12:03 PM    Recent Labs Lab 04/09/16 1214 04/10/16 0341 04/12/16 0523 04/13/16 0421  NA 134* 134* 132* 129*  K 3.7 3.4* 3.4* 3.7  CL 96* 92* 92* 94*  CO2 26 29 29 27   GLUCOSE 337* 174* 204* 270*  BUN 20 13 29* 21*  CREATININE 5.19* 4.00* 6.42* 5.08*  CALCIUM 9.9 9.4 9.0 8.5*  PHOS 3.9  --   --   --     Recent Labs Lab 04/09/16 1214  ALBUMIN 3.5    Recent Labs Lab 04/09/16 0903  04/11/16 0432 04/12/16 0523 04/13/16 0421  WBC 12.4*  < > 7.9 7.6 6.9  NEUTROABS 10.0*  --   --   --   --  HGB 11.4*  < > 9.6* 9.3* 10.0*  HCT 34.2*  < > 29.7* 28.8* 30.6*  MCV 94.2  < > 97.4 95.7 97.5  PLT 205  < > 188 210 218  < > = values in this interval not displayed. Iron/TIBC/Ferritin/ %Sat    Component Value Date/Time   IRON 27 (L) 04/18/2014 1304   TIBC 240 04/18/2014 1304   FERRITIN 95 04/18/2014 1304   IRONPCTSAT 11 (L) 04/18/2014 1304

## 2016-04-13 NOTE — Progress Notes (Signed)
Progress Note  Patient Name: Aaron Long Date of Encounter: 04/13/2016  Primary Cardiologist: [place] is OK at rest  No CP    Inpatient Medications    Scheduled Meds: . amLODipine  5 mg Oral Daily  . carvedilol  3.125 mg Oral BID WC  . clopidogrel  75 mg Oral Daily  . [START ON 04/14/2016] darbepoetin (ARANESP) injection - DIALYSIS  60 mcg Intravenous Q Wed-HD  . doxercalciferol  3 mcg Intravenous Q M,W,F-HD  . ferric citrate  420 mg Oral TID  . [START ON 04/14/2016] ferric gluconate (FERRLECIT/NULECIT) IV  62.5 mg Intravenous Q Wed-HD  . heparin  5,000 Units Subcutaneous Q8H  . insulin aspart  0-15 Units Subcutaneous TID WC  . insulin aspart  0-5 Units Subcutaneous QHS  . insulin detemir  17 Units Subcutaneous BID  . polyethylene glycol  17 g Oral Daily  . pravastatin  40 mg Oral Daily  . pregabalin  75 mg Oral Daily  . QUEtiapine  25 mg Oral Daily  . sodium chloride flush  3 mL Intravenous Q12H  . tamsulosin  0.4 mg Oral Daily   Continuous Infusions:  PRN Meds: sodium chloride, acetaminophen **OR** acetaminophen, bisacodyl, HYDROcodone-acetaminophen, ondansetron **OR** ondansetron (ZOFRAN) IV, sodium chloride flush   Vital Signs    Vitals:   04/12/16 1923 04/12/16 1945 04/12/16 2000 04/13/16 0635  BP: 131/63 (!) 152/88 139/74 138/69  Pulse: 76   87  Resp:    18  Temp: 98.5 F (36.9 C)   99.6 F (37.6 C)  TempSrc: Oral   Oral  SpO2: 100%   99%  Weight:    160 lb 11.5 oz (72.9 kg)    Intake/Output Summary (Last 24 hours) at 04/13/16 0801 Last data filed at 04/13/16 0600  Gross per 24 hour  Intake           370.33 ml  Output              785 ml  Net          -414.67 ml   Filed Weights   04/12/16 0945 04/12/16 1332 04/13/16 0635  Weight: 163 lb 5.8 oz (74.1 kg) 162 lb 4.1 oz (73.6 kg) 160 lb 11.5 oz (72.9 kg)    Telemetry    SR - Personally Reviewed  ECG      Physical Exam   GEN: No acute distress.   Cardiac: RRR,  no murmurs, rubs, or gallops.  Respiratory: Clear to auscultation bilaterally. GI: Soft, nontender, non-distended  MS: No edema; s/p bilateral BKA   Neuro:  Nonfocal  Psych: Normal affect   Labs    Chemistry Recent Labs Lab 04/09/16 1214 04/10/16 0341 04/12/16 0523 04/13/16 0421  NA 134* 134* 132* 129*  K 3.7 3.4* 3.4* 3.7  CL 96* 92* 92* 94*  CO2 26 29 29 27   GLUCOSE 337* 174* 204* 270*  BUN 20 13 29* 21*  CREATININE 5.19* 4.00* 6.42* 5.08*  CALCIUM 9.9 9.4 9.0 8.5*  ALBUMIN 3.5  --   --   --   GFRNONAA 10* 13* 7* 10*  GFRAA 11* 15* 9* 12*  ANIONGAP 12 13 11 8      Hematology Recent Labs Lab 04/11/16 0432 04/12/16 0523 04/13/16 0421  WBC 7.9 7.6 6.9  RBC 3.05* 3.01* 3.14*  HGB 9.6* 9.3* 10.0*  HCT 29.7* 28.8* 30.6*  MCV 97.4 95.7 97.5  MCH 31.5 30.9 31.8  MCHC 32.3 32.3 32.7  RDW 15.2 15.2 15.7*  PLT 188 210 218    Cardiac Enzymes Recent Labs Lab 04/10/16 1017 04/10/16 1507 04/10/16 2236  TROPONINI 4.26* 3.56* 2.46*    Recent Labs Lab 04/09/16 0913  TROPIPOC 1.85*     BNPNo results for input(s): BNP, PROBNP in the last 168 hours.   DDimer No results for input(s): DDIMER in the last 168 hours.   Radiology    No results found.  Cardiac Studies   Cath   Conclusions: 1. Severe native coronary artery disease, including chronic total occlusions of the proximal LAD, mid LAD, ostial LCx, and mid RCA. 2. All bypass grafts widely patent (LIMA->LAD, SVG->diagonal, SVG->OM, and SVG->PDA). There is 80% stenosis involving the grafted OM proximal to SVG anastomosis, which backfills the distal LCx. 3. Moderately elevated LVEDP (30 mmHg). 4. Severely reduced LV contraction (LVEF ~25%).  Conclusions: 1. Aggressive medical therapy, including additional fluid removal by hemodialysis. 2. Consider addition of beta blocker and/or long-acting nitrate (in addition to other evidence-based heart failure therapies  Patient Profile     76 y.o. male with CAD,  CABG 2007, patent grafts at cath 2016, ESRD on [facility] MWF, PAD s/p Bilat BKA presents with acute pulmonary edema, new ECG changes, small NSTEMI by enzymes and echo suggestive of LAD lesion with patent LIMA to distal vessel.   Assessment & Plan    1  CAD  Cath yesterday showed patent grafts  LVEDP was severely elevated at 30 mm Hg  WIll need to have addition fluid removal at hemodialysis .   2  Chronic systolic CHF  As noted above LVEDP elevated  Needs more fluid removal I have added hydralazine/NTG to regimen  COntinue b blocker   Has dialysis tomorrow.   3  HL  Continue pravastatin   Signed, Dorris Carnes, MD  04/13/2016, 8:01 AM

## 2016-04-13 NOTE — Progress Notes (Signed)
Inpatient Diabetes Program Recommendations  AACE/ADA: New Consensus Statement on Inpatient Glycemic Control (2015)  Target Ranges:  Prepandial:   less than 140 mg/dL      Peak postprandial:   less than 180 mg/dL (1-2 hours)      Critically ill patients:  140 - 180 mg/dL   Lab Results  Component Value Date   GLUCAP 207 (H) 04/13/2016   HGBA1C 10.7 (H) 04/11/2016    Review of Glycemic Control  Diabetes history: DM, ESRD on HD  Outpatient Diabetes medications: Levemir 17 units BID, meal coverage of Novolog 5 units TIDAC  Current orders for Inpatient glycemic control: moderate correction scale Novolog 0-15 units TIDAC and 0-5 units QHS, Levemir 17 units BID   Inpatient Diabetes Program Recommendations:   Correction (SSI): Due to ESRD, please consider decreasing frequency and amount of correction to sensitive Novolog 0-9 units TID AC  Insulin - Meal Coverage: Please consider adding Novolog 3 units meal coverage TID AC if patient eats >50% of meal.  Thank you,  Windy Carina, RN, MSN Diabetes Coordinator Inpatient Diabetes Program [phone] (Team Pager)

## 2016-04-14 DIAGNOSIS — E161 Other hypoglycemia: Secondary | ICD-10-CM | POA: Diagnosis not present

## 2016-04-14 DIAGNOSIS — N2581 Secondary hyperparathyroidism of renal origin: Secondary | ICD-10-CM | POA: Diagnosis not present

## 2016-04-14 DIAGNOSIS — E119 Type 2 diabetes mellitus without complications: Secondary | ICD-10-CM | POA: Diagnosis not present

## 2016-04-14 DIAGNOSIS — D509 Iron deficiency anemia, unspecified: Secondary | ICD-10-CM | POA: Diagnosis not present

## 2016-04-14 DIAGNOSIS — R739 Hyperglycemia, unspecified: Secondary | ICD-10-CM | POA: Diagnosis not present

## 2016-04-14 DIAGNOSIS — D631 Anemia in chronic kidney disease: Secondary | ICD-10-CM | POA: Diagnosis not present

## 2016-04-14 DIAGNOSIS — E876 Hypokalemia: Secondary | ICD-10-CM | POA: Diagnosis not present

## 2016-04-14 DIAGNOSIS — N186 End stage renal disease: Secondary | ICD-10-CM | POA: Diagnosis not present

## 2016-04-16 DIAGNOSIS — D509 Iron deficiency anemia, unspecified: Secondary | ICD-10-CM | POA: Diagnosis not present

## 2016-04-16 DIAGNOSIS — E161 Other hypoglycemia: Secondary | ICD-10-CM | POA: Diagnosis not present

## 2016-04-16 DIAGNOSIS — R739 Hyperglycemia, unspecified: Secondary | ICD-10-CM | POA: Diagnosis not present

## 2016-04-16 DIAGNOSIS — E119 Type 2 diabetes mellitus without complications: Secondary | ICD-10-CM | POA: Diagnosis not present

## 2016-04-16 DIAGNOSIS — N186 End stage renal disease: Secondary | ICD-10-CM | POA: Diagnosis not present

## 2016-04-16 DIAGNOSIS — N2581 Secondary hyperparathyroidism of renal origin: Secondary | ICD-10-CM | POA: Diagnosis not present

## 2016-04-19 ENCOUNTER — Encounter: Payer: Self-pay | Admitting: Internal Medicine

## 2016-04-19 DIAGNOSIS — E161 Other hypoglycemia: Secondary | ICD-10-CM | POA: Diagnosis not present

## 2016-04-19 DIAGNOSIS — R739 Hyperglycemia, unspecified: Secondary | ICD-10-CM | POA: Diagnosis not present

## 2016-04-19 DIAGNOSIS — N186 End stage renal disease: Secondary | ICD-10-CM | POA: Diagnosis not present

## 2016-04-19 DIAGNOSIS — N2581 Secondary hyperparathyroidism of renal origin: Secondary | ICD-10-CM | POA: Diagnosis not present

## 2016-04-19 DIAGNOSIS — D509 Iron deficiency anemia, unspecified: Secondary | ICD-10-CM | POA: Diagnosis not present

## 2016-04-19 DIAGNOSIS — E119 Type 2 diabetes mellitus without complications: Secondary | ICD-10-CM | POA: Diagnosis not present

## 2016-04-23 DIAGNOSIS — D509 Iron deficiency anemia, unspecified: Secondary | ICD-10-CM | POA: Diagnosis not present

## 2016-04-23 DIAGNOSIS — E161 Other hypoglycemia: Secondary | ICD-10-CM | POA: Diagnosis not present

## 2016-04-23 DIAGNOSIS — E119 Type 2 diabetes mellitus without complications: Secondary | ICD-10-CM | POA: Diagnosis not present

## 2016-04-23 DIAGNOSIS — N2581 Secondary hyperparathyroidism of renal origin: Secondary | ICD-10-CM | POA: Diagnosis not present

## 2016-04-23 DIAGNOSIS — N186 End stage renal disease: Secondary | ICD-10-CM | POA: Diagnosis not present

## 2016-04-23 DIAGNOSIS — R739 Hyperglycemia, unspecified: Secondary | ICD-10-CM | POA: Diagnosis not present

## 2016-04-26 ENCOUNTER — Encounter: Payer: Self-pay | Admitting: Cardiology

## 2016-04-26 ENCOUNTER — Ambulatory Visit: Payer: Medicare Other | Admitting: Physician Assistant

## 2016-04-26 DIAGNOSIS — D509 Iron deficiency anemia, unspecified: Secondary | ICD-10-CM | POA: Diagnosis not present

## 2016-04-26 DIAGNOSIS — R739 Hyperglycemia, unspecified: Secondary | ICD-10-CM | POA: Diagnosis not present

## 2016-04-26 DIAGNOSIS — N2581 Secondary hyperparathyroidism of renal origin: Secondary | ICD-10-CM | POA: Diagnosis not present

## 2016-04-26 DIAGNOSIS — N186 End stage renal disease: Secondary | ICD-10-CM | POA: Diagnosis not present

## 2016-04-26 DIAGNOSIS — E161 Other hypoglycemia: Secondary | ICD-10-CM | POA: Diagnosis not present

## 2016-04-26 DIAGNOSIS — E119 Type 2 diabetes mellitus without complications: Secondary | ICD-10-CM | POA: Diagnosis not present

## 2016-04-27 ENCOUNTER — Encounter: Payer: Self-pay | Admitting: Physician Assistant

## 2016-04-27 ENCOUNTER — Ambulatory Visit (INDEPENDENT_AMBULATORY_CARE_PROVIDER_SITE_OTHER): Payer: Medicare Other | Admitting: Physician Assistant

## 2016-04-27 VITALS — BP 131/63 | HR 74 | Ht 68.0 in | Wt 159.0 lb

## 2016-04-27 DIAGNOSIS — I10 Essential (primary) hypertension: Secondary | ICD-10-CM | POA: Diagnosis not present

## 2016-04-27 DIAGNOSIS — I5022 Chronic systolic (congestive) heart failure: Secondary | ICD-10-CM | POA: Diagnosis not present

## 2016-04-27 NOTE — Progress Notes (Signed)
Cardiology Office Note   Date:  04/27/2016   ID:  Aaron Long, DOB Mar 14, 1940, MRN [id number]  PCP:  Aaron Kinnier, DO  Cardiologist:  Dr Aaron Islam, PA-C   Chief Complaint  Patient presents with  . Follow-up    hospital follow up, med refills on cardiac meds     History of Present Illness: Aaron Long is a 76 y.o. male with a history of CABG 2007, patent grafts at cath 2016, ESRD on Long MWF, PAD s/p Bilat BKA  Admit 03/30-04/03/2016 for resp failure w/ hypoxia>>HD, NSTEMI>>cath w/ patent grafts, 80% OM stenosis, med rx, EF 25%  Aaron Long presents for post-hospital follow up.  He is watching his fluid intake more carefully. He has not had the SOB that sent him to the hospital. He has not missed HD appts. His wife does the grocery shopping. She is legally blind, but tries to get low sodium foods. They are eating frozen vegetables, and she rinses everything. A daughter also joins [place] and helps in his care.  He was over-drinking fluids, is more compliant now. His wife helps with compliance. He is not having any breathing problems, feels he is at baseline. No chest pain. Tolerating HD well.    Past Medical History:  Diagnosis Date  . Abnormality of gait   . Anemia   . BPH (benign prostatic hypertrophy)   . CHF (congestive heart failure) ([place])   . Complication of anesthesia    "he gets delirious"  . Constipation    takes Miralax daily  . Coronary artery disease    a. s/p NSTEMI/CABGx4 in 2007 - LIMA-LAD, SVG-optional diagonal, SVVG-OM, SVG-dRCA. b. Nuc 05/2011: nonischemic.  . Diabetic peripheral neuropathy ([place])   . Dieulafoy lesion of rectum 05/03/2015  . Disorder of bone and cartilage, unspecified   . DVT (deep venous thrombosis) ([place])    a. Lower extremity DVT in 2013.  [name] Dyspnea   . ESRD on dialysis [facility])    M-W-F  . Gangrene of toe ([facility])    dry  . Hemorrhoids   . Hx of colonic polyps   . Hyperlipidemia   . Hypertension   .  Hypertrophy of prostate without urinary obstruction and other lower urinary tract symptoms (LUTS)   . Ischemic cardiomyopathy    a. EF 40% in 2007. b. 51% by nuc 05/2011.  [name] Kidney stones   . Lower limb amputation, below knee   . Myocardial infarction ([place]) 2005  . Neuromuscular disorder ([place])    diabetic neuropathy  . NSTEMI (non-ST elevated myocardial infarction) ([place]) 04/2016  . PAD (peripheral artery disease) ([place])    a. R CEA 2007. b. Hx L BKA in 2013. c. s/p LE angioplasty in 2014. d. Hx R BKA in 2014.  [name] Peripheral neuropathy   . Prostate cancer ([place]) 2010  . Secondary hyperparathyroidism ([place])    Secondary Hyperpara- Thyroidism, Renal  . Type II diabetes mellitus ([place])     Past Surgical History:  Procedure Laterality Date  . ABDOMINAL AORTAGRAM N/A 05/28/2011   Procedure: ABDOMINAL Aaron Long;  Surgeon: Aaron Long;  Location: [facility] CATH LAB;  Service: Cardiovascular;  Laterality: N/A;  . ABDOMINAL AORTAGRAM N/A 06/02/2012   Procedure: ABDOMINAL Aaron Long;  Surgeon: Aaron Long;  Location: [facility] CATH LAB;  Service: Cardiovascular;  Laterality: N/A;  . ABDOMINAL AORTAGRAM N/A 06/09/2012   Procedure: ABDOMINAL Aaron Long;  Surgeon: Aaron Long;  Location: [facility] CATH LAB;  Service:  Cardiovascular;  Laterality: N/A;  . AMPUTATION  06/14/2011   Procedure: AMPUTATION DIGIT;  Surgeon: Aaron Long;  Location: [facility] OR;  Service: Vascular;  Laterality: Left;  Amputation Left fifth toe  . AMPUTATION  06/16/2011   Procedure: AMPUTATION BELOW KNEE;  Surgeon: Aaron Long;  Location: [place];  Service: Vascular;  Laterality: Left;  . AMPUTATION Right 06/12/2012   Procedure: AMPUTATION DIGIT;  Surgeon: Aaron Long;  Location: [place];  Service: Vascular;  Laterality: Right;  GREAT TOE  . AMPUTATION Right 08/08/2012   Procedure: AMPUTATION BELOW KNEE;  Surgeon: Aaron Long;  Location: [place];  Service: Vascular;  Laterality: Right;  . AV FISTULA PLACEMENT  Right 04/19/2014   Procedure: RIGHT ARM ARTERIOVENOUS (AV) FISTULA CREATION;  Surgeon: Aaron Misty, Long;  Location: [place];  Service: Vascular;  Laterality: Right;  . AV FISTULA PLACEMENT Left 12/12/2014   Procedure: BRACHIOCEPHALIC ARTERIOVENOUS (AV) FISTULA CREATION;  Surgeon: Aaron Misty, Long;  Location: [place];  Service: Vascular;  Laterality: Left;  . CARDIAC CATHETERIZATION  05/28/11  . CARDIAC CATHETERIZATION N/A 10/04/2014   Procedure: Left Heart Cath and Coronary Angiography;  Surgeon: Aaron Mocha, Long;  Location: [place] CV LAB;  Service: Cardiovascular;  Laterality: N/A;  . CAROTID ENDARTERECTOMY Right 2005  . CATARACT EXTRACTION W/ INTRAOCULAR LENS  IMPLANT, BILATERAL Bilateral 2011  . COLONOSCOPY    . COLONOSCOPY N/A 05/03/2015   Procedure: COLONOSCOPY;  Surgeon: Aaron Mayer, Long;  Location: [place];  Service: Endoscopy;  Laterality: N/A;  . CORONARY ARTERY BYPASS GRAFT  2005   CABG X4  . ENDARTERECTOMY Left 10/17/2014   Procedure: ENDARTERECTOMY CAROTID;  Surgeon: Aaron Misty, Long;  Location: [place];  Service: Vascular;  Laterality: Left;  . I&D EXTREMITY  01/31/2012   Procedure: IRRIGATION AND DEBRIDEMENT EXTREMITY;  Surgeon: Aaron Long;  Location: [place];  Service: Vascular;  Laterality: Left;  I & D Left BKA   . INSERTION OF DIALYSIS CATHETER Right 04/19/2014   Procedure: INSERTION OF DIALYSIS CATHETER-INTERNAL JUGULAR;  Surgeon: Aaron Misty, Long;  Location: [place];  Service: Vascular;  Laterality: Right;  . INSERTION OF DIALYSIS CATHETER N/A 11/06/2014   Procedure: INSERTION OF 23cm DIALYSIS CATHETER - right internal jugular;  Surgeon: Aaron Misty, Long;  Location: [place];  Service: Vascular;  Laterality: N/A;  . KNEE CARTILAGE SURGERY Left 1964  . LEFT HEART CATH AND CORONARY ANGIOGRAPHY N/A 04/12/2016   Procedure: Left Heart Cath and Coronary Angiography;  Surgeon: Aaron Bush, Long;  Location: [place] CV LAB;  Service: Cardiovascular;  Laterality: N/A;   . LIGATION OF ARTERIOVENOUS  FISTULA Right 11/06/2014   Procedure: LIGATION OF RADIOCEPHALIC ARTERIOVENOUS FISTULA;  Surgeon: Aaron Misty, Long;  Location: [place];  Service: Vascular;  Laterality: Right;  . LIGATION OF COMPETING BRANCHES OF ARTERIOVENOUS FISTULA Right 06/19/2014   Procedure: Right Arm LIGATION OF COMPETING BRANCHES OF RADIOCEPHALIC ARTERIOVENOUS FISTULA;  Surgeon: Aaron Misty, Long;  Location: [place];  Service: Vascular;  Laterality: Right;  . PROSTATECTOMY  2009    Current Outpatient Prescriptions  Medication Sig Dispense Refill  . amLODipine (NORVASC) 5 MG tablet Take 1 tablet (5 mg total) by mouth daily. Take one tablet by mouth once daily 30 tablet 3  . ASPIRIN LOW DOSE 81 MG EC tablet TAKE 1 TABLET BY MOUTH EVERY MORNING 90 tablet 10  . AURYXIA 1 GM 210 MG(Fe) tablet Take 420 mg by  mouth 3 (three) times daily.  0  . calcium acetate (PHOSLO) 667 MG capsule TAKE 1 CAPSULE BY MOUTH THREE TIMES DAILY WITH MEALS 90 capsule 5  . carvedilol (COREG) 3.125 MG tablet Take 1 tablet (3.125 mg total) by mouth 2 (two) times daily with a meal. 60 tablet 3  . clopidogrel (PLAVIX) 75 MG tablet Take one tablet by mouth once daily (Patient taking differently: Take 75 mg by mouth daily. ) 30 tablet 0  . hydrALAZINE (APRESOLINE) 25 MG tablet Take 1 tablet (25 mg total) by mouth every 8 (eight) hours. 90 tablet 3  . HYDROcodone-acetaminophen (NORCO/VICODIN) 5-325 MG tablet Take 1 tablet by mouth every 6 (six) hours as needed for moderate pain. 120 tablet 0  . insulin aspart (NOVOLOG) 100 UNIT/ML FlexPen Inject 5 Units into the skin 3 (three) times daily with meals. Inject 5 units three times a day with meals if blood sugar greater than 150 only, DX: 250.71 15 mL 0  . Insulin Detemir (LEVEMIR FLEXTOUCH) 100 UNIT/ML Pen INJECT 17 UNITS SUBCUTANEOUSLY TWICE DAILY (Patient taking differently: Inject 17 Units into the skin 2 (two) times daily. ) 15 mL 0  . isosorbide mononitrate (IMDUR) 30 MG 24 hr  tablet Take 1 tablet (30 mg total) by mouth daily. 30 tablet 3  . polyethylene glycol (MIRALAX) packet Take 17 g by mouth daily. 30 each 0  . pravastatin (PRAVACHOL) 40 MG tablet Take one tablet by mouth once daily (Patient taking differently: Take 40 mg by mouth daily. ) 30 tablet 0  . pregabalin (LYRICA) 75 MG capsule Take one tablet by mouth once daily for pains (Patient taking differently: Take 75 mg by mouth daily. ) 30 capsule 0  . QUEtiapine (SEROQUEL) 25 MG tablet Take 1 tablet (25 mg total) by mouth daily. 30 tablet 0  . tamsulosin (FLOMAX) 0.4 MG CAPS capsule Take one capsule by mouth once daily (Patient taking differently: Take 0.4 mg by mouth daily. ) 30 capsule 0   No current facility-administered medications for this visit.     Allergies:   Oxycodone    Social History:  The patient  reports that he quit smoking about 25 years ago. His smoking use included Cigarettes. He has a 30.00 pack-year smoking history. He has never used smokeless tobacco. He reports that he drinks alcohol. He reports that he uses drugs, including Marijuana.   Family History:  [name] family history includes Alzheimer's disease in his sister; Cancer in his brother and mother; Heart disease in his sister; Hyperlipidemia in his father and mother; Hypertension in his father and mother; Kidney disease in his father; Other in his sister.    ROS:  Please see the history of present illness. All other systems are reviewed and negative.    PHYSICAL EXAM: VS:  BP 131/63   Pulse 74   Ht 5\' 8"  (1.727 m)   Wt 159 lb (72.1 kg)   BMI 24.18 kg/m  , BMI Body mass index is 24.18 kg/m. GEN: Well nourished, well developed, male in no acute distress  HEENT: normal for age  Neck: no JVD, bilateral carotid bruits, no masses Cardiac: RRR; soft murmur, no rubs, or gallops Respiratory:  clear to auscultation bilaterally, normal work of breathing GI: soft, nontender, nondistended, + BS MS: no deformity or atrophy; no  edema; distal pulses are 2+ in upper extremities, s/p bilateral BKAs Skin: warm and dry, no rash Neuro:  Strength and sensation are intact Psych: euthymic mood, full affect  EKG:  EKG is not ordered today.   Recent Labs: 05/01/2015: ALT 10 04/13/2016: BUN 21; Creatinine, Ser 5.08; Hemoglobin 10.0; Platelets 218; Potassium 3.7; Sodium 129    Lipid Panel    Component Value Date/Time   CHOL 237 (H) 04/10/2015 1535   TRIG 228 (H) 04/10/2015 1535   HDL 34 (L) 04/10/2015 1535   CHOLHDL 7.0 (H) 04/10/2015 1535   CHOLHDL 6.1 10/01/2014 0148   VLDL 32 10/01/2014 0148   LDLCALC 157 (H) 04/10/2015 1535     Wt Readings from Last 3 Encounters:  04/27/16 159 lb (72.1 kg)  04/13/16 160 lb 11.5 oz (72.9 kg)  10/30/15 170 lb (77.1 kg)     Other studies Reviewed: Additional studies/ records that were reviewed today include: office notes, hospital records and testing.  ASSESSMENT AND PLAN:  1.  Chronic systolic CHF: Continue HD for volume management. No change in BP meds. Compliance with diet restrictions and rx emphasized.  2. CAD: No ongoing ischemic sx.   3. HTN: compliance w/ rx emphasized. His BP is good today.    Current medicines are reviewed at length with the patient today.  The patient does not have concerns regarding medicines.  The following changes have been made:  no change  Labs/ tests ordered today include:  No orders of the defined types were placed in this encounter.   Disposition:   FU with Dr Angelena Form  Signed, Rosaria Ferries, PA-C  04/27/2016 4:34 PM    [place] Phone: [phone]; Fax: [phone]  This note was written with the assistance of speech recognition software. Please excuse any transcriptional errors.

## 2016-04-27 NOTE — Patient Instructions (Signed)
Medication Instructions:  Your physician recommends that you continue on your current medications as directed. Please refer to the Current Medication list given to you today.  If you need a refill on your cardiac medications before your next appointment, please call your pharmacy.   Follow-Up: Your physician wants you to follow-up in: [place].  Special Instructions: CONTINUE YOUR DIALYSIS FLUID RESTRICTION    Thank you for choosing [facility] at [facility]!!    RHONDA BARRETT, PA-C Sharyn Lull, LPN

## 2016-04-28 DIAGNOSIS — E119 Type 2 diabetes mellitus without complications: Secondary | ICD-10-CM | POA: Diagnosis not present

## 2016-04-28 DIAGNOSIS — D509 Iron deficiency anemia, unspecified: Secondary | ICD-10-CM | POA: Diagnosis not present

## 2016-04-28 DIAGNOSIS — E161 Other hypoglycemia: Secondary | ICD-10-CM | POA: Diagnosis not present

## 2016-04-28 DIAGNOSIS — N2581 Secondary hyperparathyroidism of renal origin: Secondary | ICD-10-CM | POA: Diagnosis not present

## 2016-04-28 DIAGNOSIS — N186 End stage renal disease: Secondary | ICD-10-CM | POA: Diagnosis not present

## 2016-04-28 DIAGNOSIS — R739 Hyperglycemia, unspecified: Secondary | ICD-10-CM | POA: Diagnosis not present

## 2016-04-30 DIAGNOSIS — N186 End stage renal disease: Secondary | ICD-10-CM | POA: Diagnosis not present

## 2016-04-30 DIAGNOSIS — D509 Iron deficiency anemia, unspecified: Secondary | ICD-10-CM | POA: Diagnosis not present

## 2016-04-30 DIAGNOSIS — N2581 Secondary hyperparathyroidism of renal origin: Secondary | ICD-10-CM | POA: Diagnosis not present

## 2016-04-30 DIAGNOSIS — E161 Other hypoglycemia: Secondary | ICD-10-CM | POA: Diagnosis not present

## 2016-04-30 DIAGNOSIS — R739 Hyperglycemia, unspecified: Secondary | ICD-10-CM | POA: Diagnosis not present

## 2016-04-30 DIAGNOSIS — E119 Type 2 diabetes mellitus without complications: Secondary | ICD-10-CM | POA: Diagnosis not present

## 2016-05-03 DIAGNOSIS — N2581 Secondary hyperparathyroidism of renal origin: Secondary | ICD-10-CM | POA: Diagnosis not present

## 2016-05-03 DIAGNOSIS — N186 End stage renal disease: Secondary | ICD-10-CM | POA: Diagnosis not present

## 2016-05-03 DIAGNOSIS — R739 Hyperglycemia, unspecified: Secondary | ICD-10-CM | POA: Diagnosis not present

## 2016-05-03 DIAGNOSIS — E161 Other hypoglycemia: Secondary | ICD-10-CM | POA: Diagnosis not present

## 2016-05-03 DIAGNOSIS — D509 Iron deficiency anemia, unspecified: Secondary | ICD-10-CM | POA: Diagnosis not present

## 2016-05-03 DIAGNOSIS — E119 Type 2 diabetes mellitus without complications: Secondary | ICD-10-CM | POA: Diagnosis not present

## 2016-05-05 DIAGNOSIS — R739 Hyperglycemia, unspecified: Secondary | ICD-10-CM | POA: Diagnosis not present

## 2016-05-05 DIAGNOSIS — N186 End stage renal disease: Secondary | ICD-10-CM | POA: Diagnosis not present

## 2016-05-05 DIAGNOSIS — E119 Type 2 diabetes mellitus without complications: Secondary | ICD-10-CM | POA: Diagnosis not present

## 2016-05-05 DIAGNOSIS — N2581 Secondary hyperparathyroidism of renal origin: Secondary | ICD-10-CM | POA: Diagnosis not present

## 2016-05-05 DIAGNOSIS — E161 Other hypoglycemia: Secondary | ICD-10-CM | POA: Diagnosis not present

## 2016-05-05 DIAGNOSIS — D509 Iron deficiency anemia, unspecified: Secondary | ICD-10-CM | POA: Diagnosis not present

## 2016-05-07 DIAGNOSIS — E161 Other hypoglycemia: Secondary | ICD-10-CM | POA: Diagnosis not present

## 2016-05-07 DIAGNOSIS — N186 End stage renal disease: Secondary | ICD-10-CM | POA: Diagnosis not present

## 2016-05-07 DIAGNOSIS — D509 Iron deficiency anemia, unspecified: Secondary | ICD-10-CM | POA: Diagnosis not present

## 2016-05-07 DIAGNOSIS — E119 Type 2 diabetes mellitus without complications: Secondary | ICD-10-CM | POA: Diagnosis not present

## 2016-05-07 DIAGNOSIS — R739 Hyperglycemia, unspecified: Secondary | ICD-10-CM | POA: Diagnosis not present

## 2016-05-07 DIAGNOSIS — N2581 Secondary hyperparathyroidism of renal origin: Secondary | ICD-10-CM | POA: Diagnosis not present

## 2016-05-10 DIAGNOSIS — N186 End stage renal disease: Secondary | ICD-10-CM | POA: Diagnosis not present

## 2016-05-10 DIAGNOSIS — D509 Iron deficiency anemia, unspecified: Secondary | ICD-10-CM | POA: Diagnosis not present

## 2016-05-10 DIAGNOSIS — E119 Type 2 diabetes mellitus without complications: Secondary | ICD-10-CM | POA: Diagnosis not present

## 2016-05-10 DIAGNOSIS — E161 Other hypoglycemia: Secondary | ICD-10-CM | POA: Diagnosis not present

## 2016-05-10 DIAGNOSIS — Z992 Dependence on renal dialysis: Secondary | ICD-10-CM | POA: Diagnosis not present

## 2016-05-10 DIAGNOSIS — E1122 Type 2 diabetes mellitus with diabetic chronic kidney disease: Secondary | ICD-10-CM | POA: Diagnosis not present

## 2016-05-10 DIAGNOSIS — N2581 Secondary hyperparathyroidism of renal origin: Secondary | ICD-10-CM | POA: Diagnosis not present

## 2016-05-10 DIAGNOSIS — R739 Hyperglycemia, unspecified: Secondary | ICD-10-CM | POA: Diagnosis not present

## 2016-05-12 DIAGNOSIS — R739 Hyperglycemia, unspecified: Secondary | ICD-10-CM | POA: Diagnosis not present

## 2016-05-12 DIAGNOSIS — E161 Other hypoglycemia: Secondary | ICD-10-CM | POA: Diagnosis not present

## 2016-05-12 DIAGNOSIS — N186 End stage renal disease: Secondary | ICD-10-CM | POA: Diagnosis not present

## 2016-05-12 DIAGNOSIS — N2581 Secondary hyperparathyroidism of renal origin: Secondary | ICD-10-CM | POA: Diagnosis not present

## 2016-05-12 DIAGNOSIS — E119 Type 2 diabetes mellitus without complications: Secondary | ICD-10-CM | POA: Diagnosis not present

## 2016-05-13 DIAGNOSIS — N186 End stage renal disease: Secondary | ICD-10-CM | POA: Diagnosis not present

## 2016-05-13 DIAGNOSIS — I871 Compression of vein: Secondary | ICD-10-CM | POA: Diagnosis not present

## 2016-05-13 DIAGNOSIS — T82858A Stenosis of vascular prosthetic devices, implants and grafts, initial encounter: Secondary | ICD-10-CM | POA: Diagnosis not present

## 2016-05-13 DIAGNOSIS — Z992 Dependence on renal dialysis: Secondary | ICD-10-CM | POA: Diagnosis not present

## 2016-05-14 DIAGNOSIS — R739 Hyperglycemia, unspecified: Secondary | ICD-10-CM | POA: Diagnosis not present

## 2016-05-14 DIAGNOSIS — E119 Type 2 diabetes mellitus without complications: Secondary | ICD-10-CM | POA: Diagnosis not present

## 2016-05-14 DIAGNOSIS — N2581 Secondary hyperparathyroidism of renal origin: Secondary | ICD-10-CM | POA: Diagnosis not present

## 2016-05-14 DIAGNOSIS — E161 Other hypoglycemia: Secondary | ICD-10-CM | POA: Diagnosis not present

## 2016-05-14 DIAGNOSIS — N186 End stage renal disease: Secondary | ICD-10-CM | POA: Diagnosis not present

## 2016-05-17 DIAGNOSIS — E119 Type 2 diabetes mellitus without complications: Secondary | ICD-10-CM | POA: Diagnosis not present

## 2016-05-17 DIAGNOSIS — N2581 Secondary hyperparathyroidism of renal origin: Secondary | ICD-10-CM | POA: Diagnosis not present

## 2016-05-17 DIAGNOSIS — N186 End stage renal disease: Secondary | ICD-10-CM | POA: Diagnosis not present

## 2016-05-17 DIAGNOSIS — E161 Other hypoglycemia: Secondary | ICD-10-CM | POA: Diagnosis not present

## 2016-05-17 DIAGNOSIS — R739 Hyperglycemia, unspecified: Secondary | ICD-10-CM | POA: Diagnosis not present

## 2016-05-19 DIAGNOSIS — E161 Other hypoglycemia: Secondary | ICD-10-CM | POA: Diagnosis not present

## 2016-05-19 DIAGNOSIS — N2581 Secondary hyperparathyroidism of renal origin: Secondary | ICD-10-CM | POA: Diagnosis not present

## 2016-05-19 DIAGNOSIS — R739 Hyperglycemia, unspecified: Secondary | ICD-10-CM | POA: Diagnosis not present

## 2016-05-19 DIAGNOSIS — E119 Type 2 diabetes mellitus without complications: Secondary | ICD-10-CM | POA: Diagnosis not present

## 2016-05-19 DIAGNOSIS — N186 End stage renal disease: Secondary | ICD-10-CM | POA: Diagnosis not present

## 2016-05-21 DIAGNOSIS — E161 Other hypoglycemia: Secondary | ICD-10-CM | POA: Diagnosis not present

## 2016-05-21 DIAGNOSIS — N186 End stage renal disease: Secondary | ICD-10-CM | POA: Diagnosis not present

## 2016-05-21 DIAGNOSIS — N2581 Secondary hyperparathyroidism of renal origin: Secondary | ICD-10-CM | POA: Diagnosis not present

## 2016-05-21 DIAGNOSIS — R739 Hyperglycemia, unspecified: Secondary | ICD-10-CM | POA: Diagnosis not present

## 2016-05-21 DIAGNOSIS — E119 Type 2 diabetes mellitus without complications: Secondary | ICD-10-CM | POA: Diagnosis not present

## 2016-05-24 DIAGNOSIS — E161 Other hypoglycemia: Secondary | ICD-10-CM | POA: Diagnosis not present

## 2016-05-24 DIAGNOSIS — E119 Type 2 diabetes mellitus without complications: Secondary | ICD-10-CM | POA: Diagnosis not present

## 2016-05-24 DIAGNOSIS — N2581 Secondary hyperparathyroidism of renal origin: Secondary | ICD-10-CM | POA: Diagnosis not present

## 2016-05-24 DIAGNOSIS — R739 Hyperglycemia, unspecified: Secondary | ICD-10-CM | POA: Diagnosis not present

## 2016-05-24 DIAGNOSIS — N186 End stage renal disease: Secondary | ICD-10-CM | POA: Diagnosis not present

## 2016-05-26 DIAGNOSIS — E119 Type 2 diabetes mellitus without complications: Secondary | ICD-10-CM | POA: Diagnosis not present

## 2016-05-26 DIAGNOSIS — E161 Other hypoglycemia: Secondary | ICD-10-CM | POA: Diagnosis not present

## 2016-05-26 DIAGNOSIS — N186 End stage renal disease: Secondary | ICD-10-CM | POA: Diagnosis not present

## 2016-05-26 DIAGNOSIS — R739 Hyperglycemia, unspecified: Secondary | ICD-10-CM | POA: Diagnosis not present

## 2016-05-26 DIAGNOSIS — N2581 Secondary hyperparathyroidism of renal origin: Secondary | ICD-10-CM | POA: Diagnosis not present

## 2016-05-28 DIAGNOSIS — N2581 Secondary hyperparathyroidism of renal origin: Secondary | ICD-10-CM | POA: Diagnosis not present

## 2016-05-28 DIAGNOSIS — E119 Type 2 diabetes mellitus without complications: Secondary | ICD-10-CM | POA: Diagnosis not present

## 2016-05-28 DIAGNOSIS — E161 Other hypoglycemia: Secondary | ICD-10-CM | POA: Diagnosis not present

## 2016-05-28 DIAGNOSIS — N186 End stage renal disease: Secondary | ICD-10-CM | POA: Diagnosis not present

## 2016-05-28 DIAGNOSIS — R739 Hyperglycemia, unspecified: Secondary | ICD-10-CM | POA: Diagnosis not present

## 2016-05-31 DIAGNOSIS — E119 Type 2 diabetes mellitus without complications: Secondary | ICD-10-CM | POA: Diagnosis not present

## 2016-05-31 DIAGNOSIS — R739 Hyperglycemia, unspecified: Secondary | ICD-10-CM | POA: Diagnosis not present

## 2016-05-31 DIAGNOSIS — N2581 Secondary hyperparathyroidism of renal origin: Secondary | ICD-10-CM | POA: Diagnosis not present

## 2016-05-31 DIAGNOSIS — E161 Other hypoglycemia: Secondary | ICD-10-CM | POA: Diagnosis not present

## 2016-05-31 DIAGNOSIS — N186 End stage renal disease: Secondary | ICD-10-CM | POA: Diagnosis not present

## 2016-06-02 DIAGNOSIS — R739 Hyperglycemia, unspecified: Secondary | ICD-10-CM | POA: Diagnosis not present

## 2016-06-02 DIAGNOSIS — N186 End stage renal disease: Secondary | ICD-10-CM | POA: Diagnosis not present

## 2016-06-02 DIAGNOSIS — N2581 Secondary hyperparathyroidism of renal origin: Secondary | ICD-10-CM | POA: Diagnosis not present

## 2016-06-02 DIAGNOSIS — E119 Type 2 diabetes mellitus without complications: Secondary | ICD-10-CM | POA: Diagnosis not present

## 2016-06-02 DIAGNOSIS — E161 Other hypoglycemia: Secondary | ICD-10-CM | POA: Diagnosis not present

## 2016-06-03 DIAGNOSIS — I10 Essential (primary) hypertension: Secondary | ICD-10-CM | POA: Diagnosis not present

## 2016-06-03 DIAGNOSIS — M5010 Cervical disc disorder with radiculopathy, unspecified cervical region: Secondary | ICD-10-CM | POA: Diagnosis not present

## 2016-06-03 DIAGNOSIS — N186 End stage renal disease: Secondary | ICD-10-CM | POA: Diagnosis not present

## 2016-06-03 DIAGNOSIS — I5042 Chronic combined systolic (congestive) and diastolic (congestive) heart failure: Secondary | ICD-10-CM | POA: Diagnosis not present

## 2016-06-03 DIAGNOSIS — G894 Chronic pain syndrome: Secondary | ICD-10-CM | POA: Diagnosis not present

## 2016-06-03 DIAGNOSIS — Z794 Long term (current) use of insulin: Secondary | ICD-10-CM | POA: Diagnosis not present

## 2016-06-03 DIAGNOSIS — F5101 Primary insomnia: Secondary | ICD-10-CM | POA: Diagnosis not present

## 2016-06-03 DIAGNOSIS — E1165 Type 2 diabetes mellitus with hyperglycemia: Secondary | ICD-10-CM | POA: Diagnosis not present

## 2016-06-03 DIAGNOSIS — I739 Peripheral vascular disease, unspecified: Secondary | ICD-10-CM | POA: Diagnosis not present

## 2016-06-03 DIAGNOSIS — H5440 Blindness, one eye, unspecified eye: Secondary | ICD-10-CM | POA: Diagnosis not present

## 2016-06-03 DIAGNOSIS — Z8546 Personal history of malignant neoplasm of prostate: Secondary | ICD-10-CM | POA: Diagnosis not present

## 2016-06-03 DIAGNOSIS — K5903 Drug induced constipation: Secondary | ICD-10-CM | POA: Diagnosis not present

## 2016-06-03 DIAGNOSIS — E1122 Type 2 diabetes mellitus with diabetic chronic kidney disease: Secondary | ICD-10-CM | POA: Diagnosis not present

## 2016-06-03 DIAGNOSIS — Z992 Dependence on renal dialysis: Secondary | ICD-10-CM | POA: Diagnosis not present

## 2016-06-03 DIAGNOSIS — E785 Hyperlipidemia, unspecified: Secondary | ICD-10-CM | POA: Diagnosis not present

## 2016-06-04 DIAGNOSIS — E119 Type 2 diabetes mellitus without complications: Secondary | ICD-10-CM | POA: Diagnosis not present

## 2016-06-04 DIAGNOSIS — N186 End stage renal disease: Secondary | ICD-10-CM | POA: Diagnosis not present

## 2016-06-04 DIAGNOSIS — E161 Other hypoglycemia: Secondary | ICD-10-CM | POA: Diagnosis not present

## 2016-06-04 DIAGNOSIS — R739 Hyperglycemia, unspecified: Secondary | ICD-10-CM | POA: Diagnosis not present

## 2016-06-04 DIAGNOSIS — N2581 Secondary hyperparathyroidism of renal origin: Secondary | ICD-10-CM | POA: Diagnosis not present

## 2016-06-07 DIAGNOSIS — E119 Type 2 diabetes mellitus without complications: Secondary | ICD-10-CM | POA: Diagnosis not present

## 2016-06-07 DIAGNOSIS — R739 Hyperglycemia, unspecified: Secondary | ICD-10-CM | POA: Diagnosis not present

## 2016-06-07 DIAGNOSIS — E161 Other hypoglycemia: Secondary | ICD-10-CM | POA: Diagnosis not present

## 2016-06-07 DIAGNOSIS — N2581 Secondary hyperparathyroidism of renal origin: Secondary | ICD-10-CM | POA: Diagnosis not present

## 2016-06-07 DIAGNOSIS — N186 End stage renal disease: Secondary | ICD-10-CM | POA: Diagnosis not present

## 2016-06-09 DIAGNOSIS — N186 End stage renal disease: Secondary | ICD-10-CM | POA: Diagnosis not present

## 2016-06-09 DIAGNOSIS — E161 Other hypoglycemia: Secondary | ICD-10-CM | POA: Diagnosis not present

## 2016-06-09 DIAGNOSIS — R739 Hyperglycemia, unspecified: Secondary | ICD-10-CM | POA: Diagnosis not present

## 2016-06-09 DIAGNOSIS — N2581 Secondary hyperparathyroidism of renal origin: Secondary | ICD-10-CM | POA: Diagnosis not present

## 2016-06-09 DIAGNOSIS — E119 Type 2 diabetes mellitus without complications: Secondary | ICD-10-CM | POA: Diagnosis not present

## 2016-06-10 DIAGNOSIS — E1122 Type 2 diabetes mellitus with diabetic chronic kidney disease: Secondary | ICD-10-CM | POA: Diagnosis not present

## 2016-06-10 DIAGNOSIS — Z992 Dependence on renal dialysis: Secondary | ICD-10-CM | POA: Diagnosis not present

## 2016-06-10 DIAGNOSIS — N186 End stage renal disease: Secondary | ICD-10-CM | POA: Diagnosis not present

## 2016-06-11 DIAGNOSIS — N186 End stage renal disease: Secondary | ICD-10-CM | POA: Diagnosis not present

## 2016-06-11 DIAGNOSIS — R739 Hyperglycemia, unspecified: Secondary | ICD-10-CM | POA: Diagnosis not present

## 2016-06-11 DIAGNOSIS — D631 Anemia in chronic kidney disease: Secondary | ICD-10-CM | POA: Diagnosis not present

## 2016-06-11 DIAGNOSIS — N2581 Secondary hyperparathyroidism of renal origin: Secondary | ICD-10-CM | POA: Diagnosis not present

## 2016-06-11 DIAGNOSIS — E161 Other hypoglycemia: Secondary | ICD-10-CM | POA: Diagnosis not present

## 2016-06-11 DIAGNOSIS — E119 Type 2 diabetes mellitus without complications: Secondary | ICD-10-CM | POA: Diagnosis not present

## 2016-06-14 DIAGNOSIS — N2581 Secondary hyperparathyroidism of renal origin: Secondary | ICD-10-CM | POA: Diagnosis not present

## 2016-06-14 DIAGNOSIS — E161 Other hypoglycemia: Secondary | ICD-10-CM | POA: Diagnosis not present

## 2016-06-14 DIAGNOSIS — E119 Type 2 diabetes mellitus without complications: Secondary | ICD-10-CM | POA: Diagnosis not present

## 2016-06-14 DIAGNOSIS — N186 End stage renal disease: Secondary | ICD-10-CM | POA: Diagnosis not present

## 2016-06-14 DIAGNOSIS — D631 Anemia in chronic kidney disease: Secondary | ICD-10-CM | POA: Diagnosis not present

## 2016-06-14 DIAGNOSIS — R739 Hyperglycemia, unspecified: Secondary | ICD-10-CM | POA: Diagnosis not present

## 2016-06-16 DIAGNOSIS — N186 End stage renal disease: Secondary | ICD-10-CM | POA: Diagnosis not present

## 2016-06-16 DIAGNOSIS — R739 Hyperglycemia, unspecified: Secondary | ICD-10-CM | POA: Diagnosis not present

## 2016-06-16 DIAGNOSIS — E119 Type 2 diabetes mellitus without complications: Secondary | ICD-10-CM | POA: Diagnosis not present

## 2016-06-16 DIAGNOSIS — D631 Anemia in chronic kidney disease: Secondary | ICD-10-CM | POA: Diagnosis not present

## 2016-06-16 DIAGNOSIS — E161 Other hypoglycemia: Secondary | ICD-10-CM | POA: Diagnosis not present

## 2016-06-16 DIAGNOSIS — N2581 Secondary hyperparathyroidism of renal origin: Secondary | ICD-10-CM | POA: Diagnosis not present

## 2016-06-17 DIAGNOSIS — H5203 Hypermetropia, bilateral: Secondary | ICD-10-CM | POA: Diagnosis not present

## 2016-06-17 DIAGNOSIS — H52223 Regular astigmatism, bilateral: Secondary | ICD-10-CM | POA: Diagnosis not present

## 2016-06-17 DIAGNOSIS — H0289 Other specified disorders of eyelid: Secondary | ICD-10-CM | POA: Diagnosis not present

## 2016-06-17 DIAGNOSIS — Z961 Presence of intraocular lens: Secondary | ICD-10-CM | POA: Diagnosis not present

## 2016-06-18 DIAGNOSIS — R739 Hyperglycemia, unspecified: Secondary | ICD-10-CM | POA: Diagnosis not present

## 2016-06-18 DIAGNOSIS — N2581 Secondary hyperparathyroidism of renal origin: Secondary | ICD-10-CM | POA: Diagnosis not present

## 2016-06-18 DIAGNOSIS — E161 Other hypoglycemia: Secondary | ICD-10-CM | POA: Diagnosis not present

## 2016-06-18 DIAGNOSIS — D631 Anemia in chronic kidney disease: Secondary | ICD-10-CM | POA: Diagnosis not present

## 2016-06-18 DIAGNOSIS — N186 End stage renal disease: Secondary | ICD-10-CM | POA: Diagnosis not present

## 2016-06-18 DIAGNOSIS — E119 Type 2 diabetes mellitus without complications: Secondary | ICD-10-CM | POA: Diagnosis not present

## 2016-06-21 DIAGNOSIS — N2581 Secondary hyperparathyroidism of renal origin: Secondary | ICD-10-CM | POA: Diagnosis not present

## 2016-06-21 DIAGNOSIS — E119 Type 2 diabetes mellitus without complications: Secondary | ICD-10-CM | POA: Diagnosis not present

## 2016-06-21 DIAGNOSIS — R739 Hyperglycemia, unspecified: Secondary | ICD-10-CM | POA: Diagnosis not present

## 2016-06-21 DIAGNOSIS — E161 Other hypoglycemia: Secondary | ICD-10-CM | POA: Diagnosis not present

## 2016-06-21 DIAGNOSIS — N186 End stage renal disease: Secondary | ICD-10-CM | POA: Diagnosis not present

## 2016-06-21 DIAGNOSIS — D631 Anemia in chronic kidney disease: Secondary | ICD-10-CM | POA: Diagnosis not present

## 2016-06-23 DIAGNOSIS — N2581 Secondary hyperparathyroidism of renal origin: Secondary | ICD-10-CM | POA: Diagnosis not present

## 2016-06-23 DIAGNOSIS — N186 End stage renal disease: Secondary | ICD-10-CM | POA: Diagnosis not present

## 2016-06-23 DIAGNOSIS — R739 Hyperglycemia, unspecified: Secondary | ICD-10-CM | POA: Diagnosis not present

## 2016-06-23 DIAGNOSIS — E161 Other hypoglycemia: Secondary | ICD-10-CM | POA: Diagnosis not present

## 2016-06-23 DIAGNOSIS — E119 Type 2 diabetes mellitus without complications: Secondary | ICD-10-CM | POA: Diagnosis not present

## 2016-06-23 DIAGNOSIS — D631 Anemia in chronic kidney disease: Secondary | ICD-10-CM | POA: Diagnosis not present

## 2016-06-25 DIAGNOSIS — D631 Anemia in chronic kidney disease: Secondary | ICD-10-CM | POA: Diagnosis not present

## 2016-06-25 DIAGNOSIS — R739 Hyperglycemia, unspecified: Secondary | ICD-10-CM | POA: Diagnosis not present

## 2016-06-25 DIAGNOSIS — E161 Other hypoglycemia: Secondary | ICD-10-CM | POA: Diagnosis not present

## 2016-06-25 DIAGNOSIS — E119 Type 2 diabetes mellitus without complications: Secondary | ICD-10-CM | POA: Diagnosis not present

## 2016-06-25 DIAGNOSIS — N2581 Secondary hyperparathyroidism of renal origin: Secondary | ICD-10-CM | POA: Diagnosis not present

## 2016-06-25 DIAGNOSIS — N186 End stage renal disease: Secondary | ICD-10-CM | POA: Diagnosis not present

## 2016-06-28 ENCOUNTER — Encounter (HOSPITAL_COMMUNITY): Payer: Self-pay | Admitting: Emergency Medicine

## 2016-06-28 ENCOUNTER — Inpatient Hospital Stay (HOSPITAL_COMMUNITY)
Admission: EM | Admit: 2016-06-28 | Discharge: 2016-07-06 | DRG: 291 | Disposition: A | Discharge status: Home / Self Care | Payer: Medicare Other | Attending: Internal Medicine | Admitting: Internal Medicine

## 2016-06-28 ENCOUNTER — Emergency Department (HOSPITAL_COMMUNITY): Payer: Medicare Other

## 2016-06-28 DIAGNOSIS — I4891 Unspecified atrial fibrillation: Secondary | ICD-10-CM | POA: Diagnosis present

## 2016-06-28 DIAGNOSIS — Z9841 Cataract extraction status, right eye: Secondary | ICD-10-CM

## 2016-06-28 DIAGNOSIS — E1122 Type 2 diabetes mellitus with diabetic chronic kidney disease: Secondary | ICD-10-CM | POA: Diagnosis not present

## 2016-06-28 DIAGNOSIS — I132 Hypertensive heart and chronic kidney disease with heart failure and with stage 5 chronic kidney disease, or end stage renal disease: Principal | ICD-10-CM | POA: Diagnosis present

## 2016-06-28 DIAGNOSIS — Z794 Long term (current) use of insulin: Secondary | ICD-10-CM | POA: Diagnosis not present

## 2016-06-28 DIAGNOSIS — E1165 Type 2 diabetes mellitus with hyperglycemia: Secondary | ICD-10-CM

## 2016-06-28 DIAGNOSIS — I5043 Acute on chronic combined systolic (congestive) and diastolic (congestive) heart failure: Secondary | ICD-10-CM | POA: Diagnosis not present

## 2016-06-28 DIAGNOSIS — D638 Anemia in other chronic diseases classified elsewhere: Secondary | ICD-10-CM | POA: Diagnosis present

## 2016-06-28 DIAGNOSIS — Z992 Dependence on renal dialysis: Secondary | ICD-10-CM | POA: Diagnosis not present

## 2016-06-28 DIAGNOSIS — Z89511 Acquired absence of right leg below knee: Secondary | ICD-10-CM | POA: Diagnosis not present

## 2016-06-28 DIAGNOSIS — Z7982 Long term (current) use of aspirin: Secondary | ICD-10-CM

## 2016-06-28 DIAGNOSIS — R778 Other specified abnormalities of plasma proteins: Secondary | ICD-10-CM

## 2016-06-28 DIAGNOSIS — I10 Essential (primary) hypertension: Secondary | ICD-10-CM | POA: Diagnosis not present

## 2016-06-28 DIAGNOSIS — IMO0002 Reserved for concepts with insufficient information to code with codable children: Secondary | ICD-10-CM | POA: Diagnosis present

## 2016-06-28 DIAGNOSIS — E1142 Type 2 diabetes mellitus with diabetic polyneuropathy: Secondary | ICD-10-CM | POA: Diagnosis present

## 2016-06-28 DIAGNOSIS — R0989 Other specified symptoms and signs involving the circulatory and respiratory systems: Secondary | ICD-10-CM | POA: Diagnosis not present

## 2016-06-28 DIAGNOSIS — R0602 Shortness of breath: Secondary | ICD-10-CM

## 2016-06-28 DIAGNOSIS — Z89512 Acquired absence of left leg below knee: Secondary | ICD-10-CM

## 2016-06-28 DIAGNOSIS — Z79899 Other long term (current) drug therapy: Secondary | ICD-10-CM

## 2016-06-28 DIAGNOSIS — Z9842 Cataract extraction status, left eye: Secondary | ICD-10-CM

## 2016-06-28 DIAGNOSIS — Z885 Allergy status to narcotic agent status: Secondary | ICD-10-CM

## 2016-06-28 DIAGNOSIS — N2581 Secondary hyperparathyroidism of renal origin: Secondary | ICD-10-CM | POA: Diagnosis present

## 2016-06-28 DIAGNOSIS — Z951 Presence of aortocoronary bypass graft: Secondary | ICD-10-CM

## 2016-06-28 DIAGNOSIS — E211 Secondary hyperparathyroidism, not elsewhere classified: Secondary | ICD-10-CM | POA: Diagnosis present

## 2016-06-28 DIAGNOSIS — I120 Hypertensive chronic kidney disease with stage 5 chronic kidney disease or end stage renal disease: Secondary | ICD-10-CM | POA: Diagnosis not present

## 2016-06-28 DIAGNOSIS — Z961 Presence of intraocular lens: Secondary | ICD-10-CM | POA: Diagnosis present

## 2016-06-28 DIAGNOSIS — J810 Acute pulmonary edema: Secondary | ICD-10-CM | POA: Diagnosis not present

## 2016-06-28 DIAGNOSIS — I255 Ischemic cardiomyopathy: Secondary | ICD-10-CM | POA: Diagnosis present

## 2016-06-28 DIAGNOSIS — R7989 Other specified abnormal findings of blood chemistry: Secondary | ICD-10-CM

## 2016-06-28 DIAGNOSIS — Z87891 Personal history of nicotine dependence: Secondary | ICD-10-CM

## 2016-06-28 DIAGNOSIS — N400 Enlarged prostate without lower urinary tract symptoms: Secondary | ICD-10-CM | POA: Diagnosis present

## 2016-06-28 DIAGNOSIS — K5900 Constipation, unspecified: Secondary | ICD-10-CM | POA: Diagnosis present

## 2016-06-28 DIAGNOSIS — D631 Anemia in chronic kidney disease: Secondary | ICD-10-CM | POA: Diagnosis not present

## 2016-06-28 DIAGNOSIS — E1129 Type 2 diabetes mellitus with other diabetic kidney complication: Secondary | ICD-10-CM | POA: Diagnosis present

## 2016-06-28 DIAGNOSIS — E11649 Type 2 diabetes mellitus with hypoglycemia without coma: Secondary | ICD-10-CM | POA: Diagnosis present

## 2016-06-28 DIAGNOSIS — I2510 Atherosclerotic heart disease of native coronary artery without angina pectoris: Secondary | ICD-10-CM | POA: Diagnosis present

## 2016-06-28 DIAGNOSIS — N186 End stage renal disease: Secondary | ICD-10-CM

## 2016-06-28 DIAGNOSIS — Z7902 Long term (current) use of antithrombotics/antiplatelets: Secondary | ICD-10-CM

## 2016-06-28 DIAGNOSIS — E785 Hyperlipidemia, unspecified: Secondary | ICD-10-CM | POA: Diagnosis present

## 2016-06-28 DIAGNOSIS — E1151 Type 2 diabetes mellitus with diabetic peripheral angiopathy without gangrene: Secondary | ICD-10-CM | POA: Diagnosis present

## 2016-06-28 DIAGNOSIS — Z8546 Personal history of malignant neoplasm of prostate: Secondary | ICD-10-CM

## 2016-06-28 DIAGNOSIS — I252 Old myocardial infarction: Secondary | ICD-10-CM

## 2016-06-28 DIAGNOSIS — E8770 Fluid overload, unspecified: Secondary | ICD-10-CM | POA: Diagnosis not present

## 2016-06-28 DIAGNOSIS — R0600 Dyspnea, unspecified: Secondary | ICD-10-CM

## 2016-06-28 DIAGNOSIS — J811 Chronic pulmonary edema: Secondary | ICD-10-CM | POA: Diagnosis present

## 2016-06-28 DIAGNOSIS — R748 Abnormal levels of other serum enzymes: Secondary | ICD-10-CM

## 2016-06-28 LAB — GLUCOSE, CAPILLARY
GLUCOSE-CAPILLARY: 259 mg/dL — AB (ref 65–99)
GLUCOSE-CAPILLARY: 102 mg/dL — AB (ref 65–99)
Glucose-Capillary: 366 mg/dL — ABNORMAL HIGH (ref 65–99)

## 2016-06-28 LAB — CBC WITH DIFFERENTIAL/PLATELET
BASOS ABS: 0 10*3/uL (ref 0.0–0.1)
BASOS PCT: 0 %
Eosinophils Absolute: 0.1 10*3/uL (ref 0.0–0.7)
Eosinophils Relative: 1 %
HEMATOCRIT: 41 % (ref 39.0–52.0)
Hemoglobin: 13.3 g/dL (ref 13.0–17.0)
Lymphocytes Relative: 21 %
Lymphs Abs: 2.1 10*3/uL (ref 0.7–4.0)
MCH: 30.7 pg (ref 26.0–34.0)
MCHC: 32.4 g/dL (ref 30.0–36.0)
MCV: 94.7 fL (ref 78.0–100.0)
MONO ABS: 0.5 10*3/uL (ref 0.1–1.0)
Monocytes Relative: 5 %
NEUTROS ABS: 7.4 10*3/uL (ref 1.7–7.7)
Neutrophils Relative %: 73 %
PLATELETS: 201 10*3/uL (ref 150–400)
RBC: 4.33 MIL/uL (ref 4.22–5.81)
RDW: 14.7 % (ref 11.5–15.5)
WBC: 10.1 10*3/uL (ref 4.0–10.5)

## 2016-06-28 LAB — BASIC METABOLIC PANEL
ANION GAP: 12 (ref 5–15)
BUN: 44 mg/dL — ABNORMAL HIGH (ref 6–20)
CALCIUM: 10.1 mg/dL (ref 8.9–10.3)
CO2: 25 mmol/L (ref 22–32)
Chloride: 94 mmol/L — ABNORMAL LOW (ref 101–111)
Creatinine, Ser: 6.93 mg/dL — ABNORMAL HIGH (ref 0.61–1.24)
GFR, EST AFRICAN AMERICAN: 8 mL/min — AB (ref >60)
GFR, EST NON AFRICAN AMERICAN: 7 mL/min — AB (ref >60)
Glucose, Bld: 366 mg/dL — ABNORMAL HIGH (ref 65–99)
POTASSIUM: 4.4 mmol/L (ref 3.5–5.1)
Sodium: 131 mmol/L — ABNORMAL LOW (ref 135–145)

## 2016-06-28 LAB — TROPONIN I
TROPONIN I: 0.2 ng/mL — AB (ref <0.03)
Troponin I: 0.14 ng/mL (ref <0.03)
Troponin I: 0.26 ng/mL (ref <0.03)
Troponin I: 0.32 ng/mL (ref <0.03)

## 2016-06-28 LAB — BRAIN NATRIURETIC PEPTIDE

## 2016-06-28 MED ORDER — PENTAFLUOROPROP-TETRAFLUOROETH EX AERO: 1.0000 "application " | INHALATION_SPRAY | CUTANEOUS | Status: DC | PRN

## 2016-06-28 MED ORDER — ISOSORBIDE MONONITRATE ER 30 MG PO TB24
30.0000 mg | ORAL_TABLET | Freq: Every day | ORAL | Status: DC
  Administered 2016-06-28 – 2016-07-06 (×9): 30 mg via ORAL
  Filled 2016-06-28 (×9): qty 1

## 2016-06-28 MED ORDER — QUETIAPINE FUMARATE 25 MG PO TABS
25.0000 mg | ORAL_TABLET | Freq: Every day | ORAL | Status: DC
  Administered 2016-06-28 – 2016-07-06 (×9): 25 mg via ORAL
  Filled 2016-06-28 (×9): qty 1

## 2016-06-28 MED ORDER — ASPIRIN 81 MG PO CHEW
324.0000 mg | CHEWABLE_TABLET | Freq: Once | ORAL | Status: AC
  Administered 2016-06-28: 324 mg via ORAL
  Filled 2016-06-28: qty 4

## 2016-06-28 MED ORDER — PREGABALIN 75 MG PO CAPS
75.0000 mg | ORAL_CAPSULE | Freq: Every day | ORAL | Status: DC
  Administered 2016-06-28 – 2016-07-06 (×9): 75 mg via ORAL
  Filled 2016-06-28 (×2): qty 1
  Filled 2016-06-28: qty 3
  Filled 2016-06-28 (×5): qty 1

## 2016-06-28 MED ORDER — NITROGLYCERIN 2 % TD OINT
1.0000 [in_us] | TOPICAL_OINTMENT | Freq: Four times a day (QID) | TRANSDERMAL | Status: DC
  Administered 2016-06-28 – 2016-06-29 (×5): 1 [in_us] via TOPICAL
  Filled 2016-06-28: qty 30

## 2016-06-28 MED ORDER — INSULIN ASPART 100 UNIT/ML ~~LOC~~ SOLN
0.0000 [IU] | Freq: Three times a day (TID) | SUBCUTANEOUS | Status: DC
  Administered 2016-06-28: 9 [IU] via SUBCUTANEOUS
  Administered 2016-06-28 – 2016-06-29 (×2): 5 [IU] via SUBCUTANEOUS
  Administered 2016-06-29: 2 [IU] via SUBCUTANEOUS
  Administered 2016-06-30 (×2): 3 [IU] via SUBCUTANEOUS
  Administered 2016-07-01: 2 [IU] via SUBCUTANEOUS
  Administered 2016-07-01: 1 [IU] via SUBCUTANEOUS
  Administered 2016-07-02 (×2): 2 [IU] via SUBCUTANEOUS
  Administered 2016-07-03: 3 [IU] via SUBCUTANEOUS
  Administered 2016-07-03: 7 [IU] via SUBCUTANEOUS
  Administered 2016-07-03: 1 [IU] via SUBCUTANEOUS
  Administered 2016-07-04: 2 [IU] via SUBCUTANEOUS
  Administered 2016-07-04: 1 [IU] via SUBCUTANEOUS
  Administered 2016-07-04 – 2016-07-05 (×3): 3 [IU] via SUBCUTANEOUS

## 2016-06-28 MED ORDER — HEPARIN SODIUM (PORCINE) 1000 UNIT/ML DIALYSIS: 1000.0000 [IU] | INTRAMUSCULAR | Status: DC | PRN

## 2016-06-28 MED ORDER — INSULIN DETEMIR 100 UNIT/ML ~~LOC~~ SOLN
9.0000 [IU] | Freq: Two times a day (BID) | SUBCUTANEOUS | Status: DC
  Administered 2016-06-28: 9 [IU] via SUBCUTANEOUS
  Filled 2016-06-28: qty 0.09

## 2016-06-28 MED ORDER — POLYETHYLENE GLYCOL 3350 17 G PO PACK
17.0000 g | PACK | Freq: Every day | ORAL | Status: DC
  Administered 2016-06-28 – 2016-07-04 (×7): 17 g via ORAL
  Filled 2016-06-28 (×8): qty 1

## 2016-06-28 MED ORDER — CARVEDILOL 3.125 MG PO TABS
3.1250 mg | ORAL_TABLET | Freq: Two times a day (BID) | ORAL | Status: DC
  Administered 2016-06-28 – 2016-06-29 (×2): 3.125 mg via ORAL
  Filled 2016-06-28 (×2): qty 1

## 2016-06-28 MED ORDER — PRAVASTATIN SODIUM 40 MG PO TABS
40.0000 mg | ORAL_TABLET | Freq: Every day | ORAL | Status: DC
  Administered 2016-07-01 – 2016-07-04 (×4): 40 mg via ORAL
  Filled 2016-06-28 (×6): qty 1

## 2016-06-28 MED ORDER — INSULIN ASPART 100 UNIT/ML ~~LOC~~ SOLN
3.0000 [IU] | Freq: Three times a day (TID) | SUBCUTANEOUS | Status: DC
  Administered 2016-06-28 – 2016-07-02 (×11): 3 [IU] via SUBCUTANEOUS

## 2016-06-28 MED ORDER — SENNA 8.6 MG PO TABS
1.0000 | ORAL_TABLET | Freq: Every day | ORAL | Status: DC | PRN
  Administered 2016-06-28: 8.6 mg via ORAL
  Filled 2016-06-28 (×2): qty 1

## 2016-06-28 MED ORDER — TAMSULOSIN HCL 0.4 MG PO CAPS
0.4000 mg | ORAL_CAPSULE | Freq: Every day | ORAL | Status: DC
  Administered 2016-06-28 – 2016-07-06 (×9): 0.4 mg via ORAL
  Filled 2016-06-28 (×9): qty 1

## 2016-06-28 MED ORDER — LIDOCAINE-PRILOCAINE 2.5-2.5 % EX CREA: 1.0000 "application " | TOPICAL_CREAM | CUTANEOUS | Status: DC | PRN

## 2016-06-28 MED ORDER — FERRIC CITRATE 1 GM 210 MG(FE) PO TABS
420.0000 mg | ORAL_TABLET | Freq: Three times a day (TID) | ORAL | Status: DC
  Administered 2016-06-28 – 2016-07-06 (×19): 420 mg via ORAL
  Filled 2016-06-28 (×29): qty 2

## 2016-06-28 MED ORDER — SODIUM CHLORIDE 0.9 % IV SOLN: 100.0000 mL | INTRAVENOUS | Status: DC | PRN

## 2016-06-28 MED ORDER — NITROGLYCERIN 2 % TD OINT
1.0000 [in_us] | TOPICAL_OINTMENT | Freq: Once | TRANSDERMAL | Status: AC
  Administered 2016-06-28: 1 [in_us] via TOPICAL
  Filled 2016-06-28: qty 1

## 2016-06-28 MED ORDER — CLOPIDOGREL BISULFATE 75 MG PO TABS
75.0000 mg | ORAL_TABLET | Freq: Every day | ORAL | Status: DC
  Administered 2016-06-28 – 2016-06-29 (×2): 75 mg via ORAL
  Filled 2016-06-28 (×3): qty 1

## 2016-06-28 MED ORDER — HYDROCODONE-ACETAMINOPHEN 5-325 MG PO TABS: 1.0000 | ORAL_TABLET | Freq: Four times a day (QID) | ORAL | Status: DC | PRN

## 2016-06-28 MED ORDER — ALTEPLASE 2 MG IJ SOLR: 2.0000 mg | Freq: Once | INTRAMUSCULAR | Status: DC | PRN

## 2016-06-28 MED ORDER — ALBUTEROL SULFATE (2.5 MG/3ML) 0.083% IN NEBU
5.0000 mg | INHALATION_SOLUTION | Freq: Once | RESPIRATORY_TRACT | Status: AC
  Administered 2016-06-28: 5 mg via RESPIRATORY_TRACT
  Filled 2016-06-28: qty 6

## 2016-06-28 MED ORDER — AMLODIPINE BESYLATE 5 MG PO TABS
5.0000 mg | ORAL_TABLET | Freq: Every day | ORAL | Status: DC
  Administered 2016-06-28 – 2016-06-29 (×2): 5 mg via ORAL
  Filled 2016-06-28 (×2): qty 1

## 2016-06-28 MED ORDER — CALCIUM ACETATE (PHOS BINDER) 667 MG PO CAPS
667.0000 mg | ORAL_CAPSULE | Freq: Three times a day (TID) | ORAL | Status: DC
  Administered 2016-06-28 – 2016-06-29 (×4): 667 mg via ORAL
  Filled 2016-06-28 (×4): qty 1

## 2016-06-28 MED ORDER — ASPIRIN EC 81 MG PO TBEC
81.0000 mg | DELAYED_RELEASE_TABLET | Freq: Every morning | ORAL | Status: DC
  Administered 2016-06-28 – 2016-07-06 (×9): 81 mg via ORAL
  Filled 2016-06-28 (×9): qty 1

## 2016-06-28 MED ORDER — INSULIN DETEMIR 100 UNIT/ML ~~LOC~~ SOLN
13.0000 [IU] | Freq: Two times a day (BID) | SUBCUTANEOUS | Status: DC
  Administered 2016-06-28 – 2016-07-06 (×14): 13 [IU] via SUBCUTANEOUS
  Filled 2016-06-28 (×16): qty 0.13

## 2016-06-28 MED ORDER — HEPARIN SODIUM (PORCINE) 5000 UNIT/ML IJ SOLN
5000.0000 [IU] | Freq: Three times a day (TID) | INTRAMUSCULAR | Status: DC
  Administered 2016-06-28 – 2016-06-29 (×4): 5000 [IU] via SUBCUTANEOUS
  Filled 2016-06-28 (×4): qty 1

## 2016-06-28 MED ORDER — HYDRALAZINE HCL 25 MG PO TABS
25.0000 mg | ORAL_TABLET | Freq: Three times a day (TID) | ORAL | Status: DC
  Administered 2016-06-28 – 2016-07-06 (×18): 25 mg via ORAL
  Filled 2016-06-28 (×21): qty 1

## 2016-06-28 MED ORDER — LIDOCAINE HCL (PF) 1 % IJ SOLN: 5.0000 mL | INTRAMUSCULAR | Status: DC | PRN

## 2016-06-28 NOTE — Progress Notes (Signed)
Troponin 0.20, was 0.14, MD notified, will cont to monitor, Thanks Arvella Nigh RN.

## 2016-06-28 NOTE — ED Notes (Signed)
Troponin reported to Dr. Wyvonnia Dusky

## 2016-06-28 NOTE — Progress Notes (Signed)
Patient admitted after midnight, please see H&P.  Going to dialysis today.  Weight up about 10lbs from previous--- may need more taken off.  Recent NSTEMI with troponins into the 3s.  < 1 here currently.  Aaron Bear DO

## 2016-06-28 NOTE — Consult Note (Signed)
Referring Provider: No ref. provider found Primary Care Physician:  Patient, No Pcp Per Primary Nephrologist:     Reason for Consultation:   Medical management of ESRD  Fluid overload HTN and Anemia  HPI:   Aaron Long is a 76 y.o. male with medical history significant of ESRD dialysis MWF, CHF, CAD, DM, HTN.  Patient presents to the ED with c/o sudden onset SOB that occurred while watching TV .  [place]  MWF 4h  72.5kg   Hep 2200   LUA AVF Hectoral  IV/HD Venofer 50 mg qwkly hd   K 4.4  Ca 10.1    Past Medical History:  Diagnosis Date  . Abnormality of gait   . Anemia   . BPH (benign prostatic hypertrophy)   . CHF (congestive heart failure) ([place])   . Complication of anesthesia    "he gets delirious"  . Constipation    takes Miralax daily  . Coronary artery disease    a. s/p NSTEMI/CABGx4 in 2007 - LIMA-LAD, SVG-optional diagonal, SVVG-OM, SVG-dRCA. b. Nuc 05/2011: nonischemic.  . Diabetic peripheral neuropathy ([place])   . Dieulafoy lesion of rectum 05/03/2015  . Disorder of bone and cartilage, unspecified   . DVT (deep venous thrombosis) ([place])    a. Lower extremity DVT in 2013.  [name] Dyspnea   . ESRD on dialysis [facility])    M-W-F  . Gangrene of toe (H[facility])    dry  . Hemorrhoids   . Hx of colonic polyps   . Hyperlipidemia   . Hypertension   . Hypertrophy of prostate without urinary obstruction and other lower urinary tract symptoms (LUTS)   . Ischemic cardiomyopathy    a. EF 40% in 2007. b. 51% by nuc 05/2011.  [name] Kidney stones   . Lower limb amputation, below knee   . Myocardial infarction ([place]) 2005  . Neuromuscular disorder ([place])    diabetic neuropathy  . NSTEMI (non-ST elevated myocardial infarction) ([place]) 04/2016  . PAD (peripheral artery disease) ([place])    a. R CEA 2007. b. Hx L BKA in 2013. c. s/p LE angioplasty in 2014. d. Hx R BKA in 2014.  [name] Peripheral neuropathy   . Prostate cancer ([place]) 2010  . Secondary hyperparathyroidism ([place])    Secondary Hyperpara-  Thyroidism, Renal  . Type II diabetes mellitus ([place])     Past Surgical History:  Procedure Laterality Date  . ABDOMINAL AORTAGRAM N/A 05/28/2011   Procedure: ABDOMINAL Maxcine Ham;  Surgeon: Elam Dutch, MD;  [facility] C[facility] LAB;  Service: Cardiovascular;  Laterality: N/A;  . ABDOMINAL AORTAGRAM N/A 06/02/2012   Procedure: ABDOMINAL Maxcine Ham;  Surgeon: Elam Dutch, MD;  Location: [facility] CATH LAB;  Service: Cardiovascular;  Laterality: N/A;  . ABDOMINAL AORTAGRAM N/A 06/09/2012   Procedure: ABDOMINAL Maxcine Ham;  Surgeon: Elam Dutch, MD;  Location: [facility] CATH LAB;  Service: Cardiovascular;  Laterality: N/A;  . AMPUTATION  06/14/2011   Procedure: AMPUTATION DIGIT;  Surgeon: Elam Dutch, MD;  Location: [facility] OR;  Service: Vascular;  Laterality: Left;  Amputation Left fifth toe  . AMPUTATION  06/16/2011   Procedure: AMPUTATION BELOW KNEE;  Surgeon: Elam Dutch, MD;  Location: [place];  Service: Vascular;  Laterality: Left;  . AMPUTATION Right 06/12/2012   Procedure: AMPUTATION DIGIT;  Surgeon: Elam Dutch, MD;  Location: [place];  Service: Vascular;  Laterality: Right;  GREAT TOE  . AMPUTATION Right 08/08/2012   Procedure: AMPUTATION BELOW KNEE;  Surgeon: Elam Dutch, MD;  Location: [facility] OR;  Service: Vascular;  Laterality: Right;  . AV FISTULA PLACEMENT Right 04/19/2014   Procedure: RIGHT ARM ARTERIOVENOUS (AV) FISTULA CREATION;  Surgeon: Mal Misty, MD;  Location: [place];  Service: Vascular;  Laterality: Right;  . AV FISTULA PLACEMENT Left 12/12/2014   Procedure: BRACHIOCEPHALIC ARTERIOVENOUS (AV) FISTULA CREATION;  Surgeon: Mal Misty, MD;  Location: [place];  Service: Vascular;  Laterality: Left;  . CARDIAC CATHETERIZATION  05/28/11  . CARDIAC CATHETERIZATION N/A 10/04/2014   Procedure: Left Heart Cath and Coronary Angiography;  Surgeon: Sherren Mocha, MD;  Location: [place] CV LAB;  Service: Cardiovascular;  Laterality: N/A;  . CAROTID ENDARTERECTOMY Right 2005  . CATARACT  EXTRACTION W/ INTRAOCULAR LENS  IMPLANT, BILATERAL Bilateral 2011  . COLONOSCOPY    . COLONOSCOPY N/A 05/03/2015   Procedure: COLONOSCOPY;  Surgeon: Gatha Mayer, MD;  Location: [place];  Service: Endoscopy;  Laterality: N/A;  . CORONARY ARTERY BYPASS GRAFT  2005   CABG X4  . ENDARTERECTOMY Left 10/17/2014   Procedure: ENDARTERECTOMY CAROTID;  Surgeon: Mal Misty, MD;  Location: [place];  Service: Vascular;  Laterality: Left;  . I&D EXTREMITY  01/31/2012   Procedure: IRRIGATION AND DEBRIDEMENT EXTREMITY;  Surgeon: Elam Dutch, MD;  Location: [place];  Service: Vascular;  Laterality: Left;  I & D Left BKA   . INSERTION OF DIALYSIS CATHETER Right 04/19/2014   Procedure: INSERTION OF DIALYSIS CATHETER-INTERNAL JUGULAR;  Surgeon: Mal Misty, MD;  Location: [place];  Service: Vascular;  Laterality: Right;  . INSERTION OF DIALYSIS CATHETER N/A 11/06/2014   Procedure: INSERTION OF 23cm DIALYSIS CATHETER - right internal jugular;  Surgeon: Mal Misty, MD;  Location: [place];  Service: Vascular;  Laterality: N/A;  . KNEE CARTILAGE SURGERY Left 1964  . LEFT HEART CATH AND CORONARY ANGIOGRAPHY N/A 04/12/2016   Procedure: Left Heart Cath and Coronary Angiography;  Surgeon: Nelva Bush, MD;  Location: [place] CV LAB;  Service: Cardiovascular;  Laterality: N/A;  . LIGATION OF ARTERIOVENOUS  FISTULA Right 11/06/2014   Procedure: LIGATION OF RADIOCEPHALIC ARTERIOVENOUS FISTULA;  Surgeon: Mal Misty, MD;  Location: [place];  Service: Vascular;  Laterality: Right;  . LIGATION OF COMPETING BRANCHES OF ARTERIOVENOUS FISTULA Right 06/19/2014   Procedure: Right Arm LIGATION OF COMPETING BRANCHES OF RADIOCEPHALIC ARTERIOVENOUS FISTULA;  Surgeon: Mal Misty, MD;  Location: [place];  Service: Vascular;  Laterality: Right;  . PROSTATECTOMY  2009    Prior to Admission medications   Medication Sig Start Date End Date Taking? Authorizing Provider  ASPIRIN LOW DOSE 81 MG EC tablet TAKE 1 TABLET BY  MOUTH EVERY MORNING Patient taking differently: TAKE 81 MG BY MOUTH EVERY MORNING 10/03/15  Yes Reed, Tiffany L, DO  amLODipine (NORVASC) 5 MG tablet Take 1 tablet (5 mg total) by mouth daily. Take one tablet by mouth once daily 04/13/16   Rai, Ripudeep K, MD  AURYXIA 1 GM 210 MG(Fe) tablet Take 420 mg by mouth 3 (three) times daily. 03/18/16   [provider]  calcium acetate (PHOSLO) 667 MG capsule TAKE 1 CAPSULE BY MOUTH THREE TIMES DAILY WITH MEALS 08/27/15   Reed, Tiffany L, DO  carvedilol (COREG) 3.125 MG tablet Take 1 tablet (3.125 mg total) by mouth 2 (two) times daily with a meal. 04/13/16   Rai, Ripudeep K, MD  clopidogrel (PLAVIX) 75 MG tablet Take one tablet by mouth once daily Patient taking differently: Take 75 mg by mouth daily.  04/05/16  Estill Dooms, MD  hydrALAZINE (APRESOLINE) 25 MG tablet Take 1 tablet (25 mg total) by mouth every 8 (eight) hours. 04/13/16   Rai, Vernelle Emerald, MD  HYDROcodone-acetaminophen (NORCO/VICODIN) 5-325 MG tablet Take 1 tablet by mouth every 6 (six) hours as needed for moderate pain. 01/09/16   Reed, Tiffany L, DO  insulin aspart (NOVOLOG) 100 UNIT/ML FlexPen Inject 5 Units into the skin 3 (three) times daily with meals. Inject 5 units three times a day with meals if blood sugar greater than 150 only, DX: 250.71 04/05/16   Estill Dooms, MD  Insulin Detemir (LEVEMIR FLEXTOUCH) 100 UNIT/ML Pen INJECT 17 UNITS SUBCUTANEOUSLY TWICE DAILY Patient taking differently: Inject 17 Units into the skin 2 (two) times daily.  04/05/16   Estill Dooms, MD  isosorbide mononitrate (IMDUR) 30 MG 24 hr tablet Take 1 tablet (30 mg total) by mouth daily. 04/14/16   Rai, Vernelle Emerald, MD  polyethylene glycol (MIRALAX) packet Take 17 g by mouth daily. 04/05/16   Estill Dooms, MD  pravastatin (PRAVACHOL) 40 MG tablet Take one tablet by mouth once daily Patient taking differently: Take 40 mg by mouth daily.  04/05/16   Estill Dooms, MD  pregabalin (LYRICA) 75 MG capsule Take  one tablet by mouth once daily for pains Patient taking differently: Take 75 mg by mouth daily.  04/05/16   Estill Dooms, MD  QUEtiapine (SEROQUEL) 25 MG tablet Take 1 tablet (25 mg total) by mouth daily. 04/05/16   Estill Dooms, MD  tamsulosin (FLOMAX) 0.4 MG CAPS capsule Take one capsule by mouth once daily Patient taking differently: Take 0.4 mg by mouth daily.  04/05/16   Estill Dooms, MD    Current Facility-Administered Medications  Medication Dose Route Frequency Provider Last Rate Last Dose  . amLODipine (NORVASC) tablet 5 mg  5 mg Oral Daily Jennette Kettle M, DO   5 mg at 06/28/16 0817  . aspirin EC tablet 81 mg  81 mg Oral q morning - 10a Etta Quill, DO   81 mg at 06/28/16 9373  . calcium acetate (PHOSLO) capsule 667 mg  667 mg Oral TID WC Etta Quill, DO   667 mg at 06/28/16 0813  . carvedilol (COREG) tablet 3.125 mg  3.125 mg Oral BID WC Jennette Kettle M, DO   3.125 mg at 06/28/16 0817  . clopidogrel (PLAVIX) tablet 75 mg  75 mg Oral Daily Etta Quill, DO   75 mg at 06/28/16 0815  . ferric citrate (AURYXIA) tablet 420 mg  420 mg Oral TID WC Etta Quill, DO   420 mg at 06/28/16 0817  . heparin injection 5,000 Units  5,000 Units Subcutaneous Q8H Etta Quill, DO   5,000 Units at 06/28/16 4287  . hydrALAZINE (APRESOLINE) tablet 25 mg  25 mg Oral Q8H Jennette Kettle M, DO   25 mg at 06/28/16 0644  . HYDROcodone-acetaminophen (NORCO/VICODIN) 5-325 MG per tablet 1 tablet  1 tablet Oral Q6H PRN Etta Quill, DO      . insulin aspart (novoLOG) injection 0-9 Units  0-9 Units Subcutaneous TID WC Etta Quill, DO   9 Units at 06/28/16 [phone]  . insulin aspart (novoLOG) injection 3 Units  3 Units Subcutaneous TID WC Etta Quill, DO   3 Units at 06/28/16 0815  . insulin detemir (LEVEMIR) injection 9 Units  9 Units Subcutaneous BID Etta Quill, DO   9 Units at 06/28/16 0813  .  isosorbide mononitrate (IMDUR) 24 hr tablet 30 mg  30 mg Oral Daily Jennette Kettle M, DO   30 mg at 06/28/16 0815  . nitroGLYCERIN (NITROGLYN) 2 % ointment 1 inch  1 inch Topical Q6H Etta Quill, DO   1 inch at 06/28/16 0745  . polyethylene glycol (MIRALAX / GLYCOLAX) packet 17 g  17 g Oral Daily Etta Quill, DO   17 g at 06/28/16 0816  . pravastatin (PRAVACHOL) tablet 40 mg  40 mg Oral q1800 Etta Quill, DO      . pregabalin (LYRICA) capsule 75 mg  75 mg Oral Daily Jennette Kettle M, DO   75 mg at 06/28/16 0816  . QUEtiapine (SEROQUEL) tablet 25 mg  25 mg Oral Daily Jennette Kettle M, DO   25 mg at 06/28/16 0817  . senna (SENOKOT) tablet 8.6 mg  1 tablet Oral Daily PRN Etta Quill, DO   8.6 mg at 06/28/16 0744  . tamsulosin (FLOMAX) capsule 0.4 mg  0.4 mg Oral Daily Jennette Kettle M, DO   0.4 mg at 06/28/16 1017    Allergies as of 06/28/2016 - Review Complete 06/28/2016  Allergen Reaction Noted  . Oxycodone Other (See Comments) 08/08/2012    Family History  Problem Relation Age of Onset  . Hyperlipidemia Mother   . Hypertension Mother   . Cancer Mother   . Hyperlipidemia Father   . Hypertension Father   . Kidney disease Father   . Heart disease Sister   . Alzheimer's disease Sister   . Cancer Brother   . Other Sister   . Coronary artery disease Neg Hx   . Anesthesia problems Neg Hx   . Hypotension Neg Hx   . Malignant hyperthermia Neg Hx   . Pseudochol deficiency Neg Hx     Social History   Social History  . Marital status: Married    Spouse name: N/A  . Number of children: 3  . Years of education: N/A   Occupational History  . Retired-[employer]    Social History Main Topics  . Smoking status: Former Smoker    Packs/day: 1.00    Years: 30.00    Types: Cigarettes    Quit date: 04/28/1991  . Smokeless tobacco: Never Used  . Alcohol use Yes     Comment: 04/17/2014 "might have a glass of wine at Christmas"  . Drug use: No  . Sexual activity: No   Other Topics Concern  . Not on file   Social History Narrative   . No narrative on file    Review of Systems: Gen: Denies any fever, chills, sweats, anorexia,+  fatigue, + weakness, malaise, weight loss, and sleep disorder HEENT: No visual complaints, No history of Retinopathy. Normal external appearance No Epistaxis or Sore throat. No sinusitis.   CV: Denies chest pain, angina, palpitations, syncope, + orthopnea, no PND, peripheral edema, and claudication. Resp:  dyspnea at rest, dyspnea with exercise, no cough, sputum, wheezing, coughing up blood, and pleurisy. GI: Denies vomiting blood, jaundice, and fecal incontinence.   Denies dysphagia or odynophagia. GU : End stage renal disease  MS: Denies joint pain, limitation of movement, and swelling, stiffness, low back pain, extremity pain. Denies muscle weakness, cramps, atrophy.  No use of non steroidal antiinflammatory drugs. Derm: Denies rash, itching, dry skin, hives, moles, warts, or unhealing ulcers.  Psych: Denies depression, anxiety, memory loss, suicidal ideation, hallucinations, paranoia, and confusion. Heme: Denies bruising, bleeding, and enlarged lymph nodes. Neuro:  No headache.  No diplopia. No dysarthria.  No dysphasia.  No history of CVA.  No Seizures. No paresthesias.  No weakness. Endocrine No DM.  No Thyroid disease.  No Adrenal disease.  Physical Exam: Vital signs in last 24 hours: Temp:  [98.5 F (36.9 C)-98.6 F (37 C)] 98.5 F (36.9 C) (06/18 0452) Pulse Rate:  [92-107] 97 (06/18 0452) Resp:  [8-23] 18 (06/18 0452) BP: (124-192)/(56-87) 124/60 (06/18 0821) SpO2:  [89 %-100 %] 95 % (06/18 0452) Weight:  [168 lb (76.2 kg)] 168 lb (76.2 kg) (06/18 0452)   General:  Ill appearing Head:  Normocephalic and atraumatic. Eyes:  Sclera clear, no icterus.   Conjunctiva pink. Ears:  Normal auditory acuity. Nose:  No deformity, discharge,  or lesions. Mouth:  No deformity or lesions, dentition normal. Neck:  Supple; no masses or thyromegaly. JVP elevated Lungs:  Clear throughout to  auscultation.   Diminished breath sounds Heart:  Regular rate and rhythm; no murmurs, clicks, rubs,  or gallops. Abdomen:  Soft, nontender and nondistended. No masses, hepatosplenomegaly or hernias noted. Normal bowel sounds, without guarding, and without rebound.   Msk:  Symmetrical without gross deformities. Normal posture. Pulses:  No carotid, renal, femoral bruits. DP and PT symmetrical and equal Extremities:  Without clubbing or edema. Neurologic:  Alert and  oriented x4;  grossly normal neurologically. Skin:  Intact without significant lesions or rashes. Cervical Nodes:  No significant cervical adenopathy. Psych:  Alert and cooperative. Normal mood and affect.  Intake/Output from previous day: No intake/output data recorded. Intake/Output this shift: Total I/O In: 240 [P.O.:240] Out: -   Lab Results:  Recent Labs  06/28/16 0146  WBC 10.1  HGB 13.3  HCT 41.0  PLT 201   BMET  Recent Labs  06/28/16 0146  NA 131*  K 4.4  CL 94*  CO2 25  GLUCOSE 366*  BUN 44*  CREATININE 6.93*  CALCIUM 10.1   LFT No results for input(s): PROT, ALBUMIN, AST, ALT, ALKPHOS, BILITOT, BILIDIR, IBILI in the last 72 hours. PT/INR No results for input(s): LABPROT, INR in the last 72 hours. Hepatitis Panel No results for input(s): HEPBSAG, HCVAB, HEPAIGM, HEPBIGM in the last 72 hours.  Studies/Results: Dg Chest 2 View  Result Date: 06/28/2016 CLINICAL DATA:  Shortness of breath tonight. EXAM: CHEST  2 VIEW COMPARISON:  Radiographs 330 and 04/10/2016 FINDINGS: Post median sternotomy. Stable heart size and mediastinal contours with atherosclerosis of the thoracic aorta. Increased pulmonary edema from prior exam. Streaky bibasilar atelectasis or scarring. Minimal fluid in the fissures and small pleural effusions. No pneumothorax or confluent airspace disease. Vascular stent overlies the left subclavian region. IMPRESSION: 1. Pulmonary edema and probable small pleural effusions, suspicious for  CHF. 2. Thoracic aortic atherosclerosis. 3. Bibasilar atelectasis or scarring. Electronically Signed   By: Jeb Levering M.D.   On: 06/28/2016 01:38   Dg Abd 2 Views  Result Date: 06/28/2016 CLINICAL DATA:  Constipation. EXAM: ABDOMEN - 2 VIEW COMPARISON:  Abdominal radiographs 06/18/2012 FINDINGS: No evidence of free air. No bowel dilatation to suggest obstruction. Moderate stool throughout the colon with small to moderate rectal stool burden. Multiple surgical clips in the pelvis. There are vascular calcifications. Osseous structures are grossly intact. IMPRESSION: Moderate stool burden throughout the colon without evidence of obstruction or free air. Electronically Signed   By: Jeb Levering M.D.   On: 06/28/2016 01:39    Assessment/Plan: 1. Volume overload /  interstitial pulmonary edema  Will proceed with dialysis  2. ESRD -  NL MWF  HD  schedule - hd today  [place]  3. Hypertension/volume  -  Challenge dry weight  Coreg and amlodipine as antihypertensives  4. Anemia  -  HGB  13 no ESA 5. Metabolic bone disease -  hypercalcemia no vitamin D or calcium based binders  6. CAD - on Plavix  7. DM type 2 - per admit  8. HO bipolar -on Seroquel    LOS: 0 Alden Bensinger W @TODAY @10 :52 AM

## 2016-06-28 NOTE — ED Notes (Signed)
Family at bedside, pt states he did not take his meds yesterday, pt reports he "forgot"

## 2016-06-28 NOTE — ED Notes (Signed)
No addl lab draw,  Pt moved to inpatient floor

## 2016-06-28 NOTE — ED Notes (Signed)
Aaron Long, patients daughter [phone]

## 2016-06-28 NOTE — ED Notes (Signed)
Attempted to call report, RN to return call 

## 2016-06-28 NOTE — ED Triage Notes (Signed)
Pt transported from home by EMS with c/o shob onset after going to bed, Albuterol 5mg  neb given by EMS. Initial RA sat 89 per EMS. Up to 100 on neb.  A & O, HD M/W/F, full tx Friday.

## 2016-06-28 NOTE — ED Provider Notes (Signed)
[facility] DEPT Provider Note   CSN: [id number] Arrival date & time: 06/28/16  4098  By signing my name below, I, Margit Banda, attest that this documentation has been prepared under the direction and in the presence of Sejal Cofield, Annie Main, MD. Electronically Signed: Margit Banda, ED Scribe. 06/28/16. 12:57 AM.  History   Chief Complaint Chief Complaint  Patient presents with  . Shortness of Breath    HPI Aaron Long is a 76 y.o. male with a PMHx CHF, CAD, MI, DM, HTN, who presents to the Emergency Department complaining of sudden SOB that started shortly PTA. Pt was watching TV when onset occurred. Associated sx include constipation. Pt reports he has to go, but can't. He has been eating and drinking okay. Was given oxygen by EMS and feels okay now. Pt had a heart attack back in April and his sx where similar to this onset. Dialysis MWF. Doesn't wear oxygen at home. Pt denies cough, CP, fever, urgency, frequency, hematuria, dysuria, and difficulty urinating.  The history is provided by the patient. No language interpreter was used.    Past Medical History:  Diagnosis Date  . Abnormality of gait   . Anemia   . BPH (benign prostatic hypertrophy)   . CHF (congestive heart failure) ([place])   . Complication of anesthesia    "he gets delirious"  . Constipation    takes Miralax daily  . Coronary artery disease    a. s/p NSTEMI/CABGx4 in 2007 - LIMA-LAD, SVG-optional diagonal, SVVG-OM, SVG-dRCA. b. Nuc 05/2011: nonischemic.  . Diabetic peripheral neuropathy ([place])   . Dieulafoy lesion of rectum 05/03/2015  . Disorder of bone and cartilage, unspecified   . DVT (deep venous thrombosis) ([place])    a. Lower extremity DVT in 2013.  [name] Dyspnea   . ESRD on dialysis [facility])    M-W-F  . Gangrene of toe (H[facility])    dry  . Hemorrhoids   . Hx of colonic polyps   . Hyperlipidemia   . Hypertension   . Hypertrophy of prostate without urinary obstruction and other lower urinary tract symptoms  (LUTS)   . Ischemic cardiomyopathy    a. EF 40% in 2007. b. 51% by nuc 05/2011.  [name] Kidney stones   . Lower limb amputation, below knee   . Myocardial infarction ([place]) 2005  . Neuromuscular disorder ([place])    diabetic neuropathy  . NSTEMI (non-ST elevated myocardial infarction) ([place]) 04/2016  . PAD (peripheral artery disease) ([place])    a. R CEA 2007. b. Hx L BKA in 2013. c. s/p LE angioplasty in 2014. d. Hx R BKA in 2014.  [name] Peripheral neuropathy   . Prostate cancer ([place]) 2010  . Secondary hyperparathyroidism ([place])    Secondary Hyperpara- Thyroidism, Renal  . Type II diabetes mellitus ([place])     Patient Active Problem List   Diagnosis Date Noted  . Elevated troponin 04/10/2016  . Acute on chronic systolic and diastolic heart failure, NYHA class 4 ([place])   . Pulmonary edema   . NSTEMI (non-ST elevated myocardial infarction) ([place])   . CHF (congestive heart failure) ([place]) 04/09/2016  . Hereditary and idiopathic peripheral neuropathy 06/26/2015  . History of pressure ulcer 06/26/2015  . Dieulafoy lesion of rectum 05/03/2015  . Rectal bleeding 05/01/2015  . Acute blood loss anemia 05/01/2015  . Numbness and tingling in right hand 11/05/2014  . Right hand pain 11/05/2014  . Carotid stenosis   . TIA (transient ischemic attack) 09/30/2014  . ESRD on dialysis ([place])   .  Anemia of chronic disease 05/02/2014  . Insomnia due to stress 05/02/2014  . Physical deconditioning 04/25/2014  . Syncope 04/17/2014  . Chest pain 04/08/2014  . Status post bilateral below knee amputation ([place]) 04/02/2014  . Cervical disc disorder with radiculopathy of cervical region 08/07/2013  . Drug-induced constipation 06/13/2013  . DM type 2, uncontrolled, with renal complications ([place]) 95/63/8756  . Hypertensive renal disease 06/13/2013  . Hyperlipidemia LDL goal <70 12/06/2012  . Diabetes mellitus with peripheral vascular disease ([place]) 09/06/2012  . GERD (gastroesophageal reflux disease) 09/06/2012  . Anemia  in chronic renal disease 07/24/2012  . Atherosclerosis of native arteries of the extremities with ulceration(440.23) 06/01/2012  . Hyperkalemia 04/24/2012  . HTN (hypertension) 04/28/2011  . CAD (coronary artery disease) 04/28/2011  . PAD (peripheral artery disease) ([place]) 04/28/2011  . Prostate cancer ([place]) 04/28/2011    Past Surgical History:  Procedure Laterality Date  . ABDOMINAL AORTAGRAM N/A 05/28/2011   Procedure: ABDOMINAL Maxcine Ham;  Surgeon: Elam Dutch, MD;  Location: [facility] CATH LAB;  Service: Cardiovascular;  Laterality: N/A;  . ABDOMINAL AORTAGRAM N/A 06/02/2012   Procedure: ABDOMINAL Maxcine Ham;  Surgeon: Elam Dutch, MD;  Location: [facility] CATH LAB;  Service: Cardiovascular;  Laterality: N/A;  . ABDOMINAL AORTAGRAM N/A 06/09/2012   Procedure: ABDOMINAL Maxcine Ham;  Surgeon: Elam Dutch, MD;  Location: [facility] CATH LAB;  Service: Cardiovascular;  Laterality: N/A;  . AMPUTATION  06/14/2011   Procedure: AMPUTATION DIGIT;  Surgeon: Elam Dutch, MD;  Location: [facility] OR;  Service: Vascular;  Laterality: Left;  Amputation Left fifth toe  . AMPUTATION  06/16/2011   Procedure: AMPUTATION BELOW KNEE;  Surgeon: Elam Dutch, MD;  Location: [place];  Service: Vascular;  Laterality: Left;  . AMPUTATION Right 06/12/2012   Procedure: AMPUTATION DIGIT;  Surgeon: Elam Dutch, MD;  Location: [place];  Service: Vascular;  Laterality: Right;  GREAT TOE  . AMPUTATION Right 08/08/2012   Procedure: AMPUTATION BELOW KNEE;  Surgeon: Elam Dutch, MD;  Location: [place];  Service: Vascular;  Laterality: Right;  . AV FISTULA PLACEMENT Right 04/19/2014   Procedure: RIGHT ARM ARTERIOVENOUS (AV) FISTULA CREATION;  Surgeon: Mal Misty, MD;  Location: [place];  Service: Vascular;  Laterality: Right;  . AV FISTULA PLACEMENT Left 12/12/2014   Procedure: BRACHIOCEPHALIC ARTERIOVENOUS (AV) FISTULA CREATION;  Surgeon: Mal Misty, MD;  Location: [place];  Service: Vascular;  Laterality: Left;  . CARDIAC  CATHETERIZATION  05/28/11  . CARDIAC CATHETERIZATION N/A 10/04/2014   Procedure: Left Heart Cath and Coronary Angiography;  Surgeon: Sherren Mocha, MD;  Location: [place] CV LAB;  Service: Cardiovascular;  Laterality: N/A;  . CAROTID ENDARTERECTOMY Right 2005  . CATARACT EXTRACTION W/ INTRAOCULAR LENS  IMPLANT, BILATERAL Bilateral 2011  . COLONOSCOPY    . COLONOSCOPY N/A 05/03/2015   Procedure: COLONOSCOPY;  Surgeon: Gatha Mayer, MD;  Location: [facility];  Service: Endoscopy;  Laterality: N/A;  . CORONARY ARTERY BYPASS GRAFT  2005   CABG X4  . ENDARTERECTOMY Left 10/17/2014   Procedure: ENDARTERECTOMY CAROTID;  Surgeon: Mal Misty, MD;  Location: [facility];  Service: Vascular;  Laterality: Left;  . I&D EXTREMITY  01/31/2012   Procedure: IRRIGATION AND DEBRIDEMENT EXTREMITY;  Surgeon: Elam Dutch, MD;  Location: [place];  Service: Vascular;  Laterality: Left;  I & D Left BKA   . INSERTION OF DIALYSIS CATHETER Right 04/19/2014   Procedure: INSERTION OF DIALYSIS CATHETER-INTERNAL JUGULAR;  Surgeon: Mal Misty, MD;  Location:  [facility] OR;  Service: Vascular;  Laterality: Right;  . INSERTION OF DIALYSIS CATHETER N/A 11/06/2014   Procedure: INSERTION OF 23cm DIALYSIS CATHETER - right internal jugular;  Surgeon: Mal Misty, MD;  Location: [place];  Service: Vascular;  Laterality: N/A;  . KNEE CARTILAGE SURGERY Left 1964  . LEFT HEART CATH AND CORONARY ANGIOGRAPHY N/A 04/12/2016   Procedure: Left Heart Cath and Coronary Angiography;  Surgeon: Nelva Bush, MD;  Location: [place] CV LAB;  Service: Cardiovascular;  Laterality: N/A;  . LIGATION OF ARTERIOVENOUS  FISTULA Right 11/06/2014   Procedure: LIGATION OF RADIOCEPHALIC ARTERIOVENOUS FISTULA;  Surgeon: Mal Misty, MD;  Location: [facility];  Service: Vascular;  Laterality: Right;  . LIGATION OF COMPETING BRANCHES OF ARTERIOVENOUS FISTULA Right 06/19/2014   Procedure: Right Arm LIGATION OF COMPETING BRANCHES OF RADIOCEPHALIC ARTERIOVENOUS  FISTULA;  Surgeon: Mal Misty, MD;  Location: [place];  Service: Vascular;  Laterality: Right;  . PROSTATECTOMY  2009       Home Medications    Prior to Admission medications   Medication Sig Start Date End Date Taking? Authorizing Provider  amLODipine (NORVASC) 5 MG tablet Take 1 tablet (5 mg total) by mouth daily. Take one tablet by mouth once daily 04/13/16   Rai, Ripudeep K, MD  ASPIRIN LOW DOSE 81 MG EC tablet TAKE 1 TABLET BY MOUTH EVERY MORNING 10/03/15   Reed, Tiffany L, DO  AURYXIA 1 GM 210 MG(Fe) tablet Take 420 mg by mouth 3 (three) times daily. 03/18/16   [provider]  calcium acetate (PHOSLO) 667 MG capsule TAKE 1 CAPSULE BY MOUTH THREE TIMES DAILY WITH MEALS 08/27/15   Reed, Tiffany L, DO  carvedilol (COREG) 3.125 MG tablet Take 1 tablet (3.125 mg total) by mouth 2 (two) times daily with a meal. 04/13/16   Rai, Ripudeep K, MD  clopidogrel (PLAVIX) 75 MG tablet Take one tablet by mouth once daily Patient taking differently: Take 75 mg by mouth daily.  04/05/16   Estill Dooms, MD  hydrALAZINE (APRESOLINE) 25 MG tablet Take 1 tablet (25 mg total) by mouth every 8 (eight) hours. 04/13/16   Rai, Vernelle Emerald, MD  HYDROcodone-acetaminophen (NORCO/VICODIN) 5-325 MG tablet Take 1 tablet by mouth every 6 (six) hours as needed for moderate pain. 01/09/16   Reed, Tiffany L, DO  insulin aspart (NOVOLOG) 100 UNIT/ML FlexPen Inject 5 Units into the skin 3 (three) times daily with meals. Inject 5 units three times a day with meals if blood sugar greater than 150 only, DX: 250.71 04/05/16   Estill Dooms, MD  Insulin Detemir (LEVEMIR FLEXTOUCH) 100 UNIT/ML Pen INJECT 17 UNITS SUBCUTANEOUSLY TWICE DAILY Patient taking differently: Inject 17 Units into the skin 2 (two) times daily.  04/05/16   Estill Dooms, MD  isosorbide mononitrate (IMDUR) 30 MG 24 hr tablet Take 1 tablet (30 mg total) by mouth daily. 04/14/16   Rai, Vernelle Emerald, MD  polyethylene glycol (MIRALAX) packet Take 17 g by mouth  daily. 04/05/16   Estill Dooms, MD  pravastatin (PRAVACHOL) 40 MG tablet Take one tablet by mouth once daily Patient taking differently: Take 40 mg by mouth daily.  04/05/16   Estill Dooms, MD  pregabalin (LYRICA) 75 MG capsule Take one tablet by mouth once daily for pains Patient taking differently: Take 75 mg by mouth daily.  04/05/16   Estill Dooms, MD  QUEtiapine (SEROQUEL) 25 MG tablet Take 1 tablet (25 mg total) by mouth daily. 04/05/16  Estill Dooms, MD  tamsulosin (FLOMAX) 0.4 MG CAPS capsule Take one capsule by mouth once daily Patient taking differently: Take 0.4 mg by mouth daily.  04/05/16   Estill Dooms, MD    Family History Family History  Problem Relation Age of Onset  . Hyperlipidemia Mother   . Hypertension Mother   . Cancer Mother   . Hyperlipidemia Father   . Hypertension Father   . Kidney disease Father   . Heart disease Sister   . Alzheimer's disease Sister   . Cancer Brother   . Other Sister   . Coronary artery disease Neg Hx   . Anesthesia problems Neg Hx   . Hypotension Neg Hx   . Malignant hyperthermia Neg Hx   . Pseudochol deficiency Neg Hx     Social History Social History  Substance Use Topics  . Smoking status: Former Smoker    Packs/day: 1.00    Years: 30.00    Types: Cigarettes    Quit date: 04/28/1991  . Smokeless tobacco: Never Used  . Alcohol use Yes     Comment: 04/17/2014 "might have a glass of wine at Christmas"     Allergies   Oxycodone   Review of Systems Review of Systems   A complete 10 system review of systems was obtained and all systems are negative except as noted in the HPI and PMH.   Physical Exam Updated Vital Signs BP (!) 178/87 (BP Location: Right Arm)   Pulse (!) 107   Temp 98.6 F (37 C) (Oral)   Resp 20   SpO2 100%   Physical Exam  Constitutional: He is oriented to person, place, and time. He appears well-developed and well-nourished. No distress.  No distress.   HENT:  Head: Normocephalic  and atraumatic.  Mouth/Throat: Oropharynx is clear and moist. No oropharyngeal exudate.  Eyes: Conjunctivae and EOM are normal. Pupils are equal, round, and reactive to light.  Neck: Normal range of motion. Neck supple.  No meningismus.  Cardiovascular: Normal rate, regular rhythm, normal heart sounds and intact distal pulses.   No murmur heard. Pulmonary/Chest: Effort normal and breath sounds normal. No respiratory distress.  Diminished breath sounds.  Abdominal: Soft. There is no tenderness. There is no rebound and no guarding.  Musculoskeletal: Normal range of motion. He exhibits no edema or tenderness.  Bilateral BKA. Dialysis fistula left upper arm with thrill.  Neurological: He is alert and oriented to person, place, and time. No cranial nerve deficit. He exhibits normal muscle tone. Coordination normal.   5/5 strength throughout. CN 2-12 intact.Equal grip strength.   Skin: Skin is warm.  Psychiatric: He has a normal mood and affect. His behavior is normal.  Nursing note and vitals reviewed.    ED Treatments / Results  DIAGNOSTIC STUDIES: Oxygen Saturation is 100% on RA, normal by my interpretation.   COORDINATION OF CARE: 12:57 AM-Discussed next steps with pt which includes XR's. Pt verbalized understanding and is agreeable with the plan.   Labs (all labs ordered are listed, but only abnormal results are displayed) Labs Reviewed  BASIC METABOLIC PANEL - Abnormal; Notable for the following:       Result Value   Sodium 131 (*)    Chloride 94 (*)    Glucose, Bld 366 (*)    BUN 44 (*)    Creatinine, Ser 6.93 (*)    GFR calc non Af Amer 7 (*)    GFR calc Af Amer 8 (*)    All  other components within normal limits  TROPONIN I - Abnormal; Notable for the following:    Troponin I 0.14 (*)    All other components within normal limits  BRAIN NATRIURETIC PEPTIDE - Abnormal; Notable for the following:    B Natriuretic Peptide >4,500.0 (*)    All other components within normal  limits  TROPONIN I - Abnormal; Notable for the following:    Troponin I 0.20 (*)    All other components within normal limits  GLUCOSE, CAPILLARY - Abnormal; Notable for the following:    Glucose-Capillary 366 (*)    All other components within normal limits  CBC WITH DIFFERENTIAL/PLATELET  TROPONIN I  TROPONIN I    EKG  EKG Interpretation  Date/Time:  Monday June 28 2016 00:37:59 EDT Ventricular Rate:  109 PR Interval:    QRS Duration: 105 QT Interval:  347 QTC Calculation: 468 R Axis:   -34 Text Interpretation:  Sinus tachycardia Probable left atrial enlargement LVH with secondary repolarization abnormality Anterior Q waves, possibly due to LVH No significant change was found Confirmed by Ezequiel Essex [id number]) on 06/28/2016 12:43:50 AM       Radiology Dg Chest 2 View  Result Date: 06/28/2016 CLINICAL DATA:  Shortness of breath tonight. EXAM: CHEST  2 VIEW COMPARISON:  Radiographs 330 and 04/10/2016 FINDINGS: Post median sternotomy. Stable heart size and mediastinal contours with atherosclerosis of the thoracic aorta. Increased pulmonary edema from prior exam. Streaky bibasilar atelectasis or scarring. Minimal fluid in the fissures and small pleural effusions. No pneumothorax or confluent airspace disease. Vascular stent overlies the left subclavian region. IMPRESSION: 1. Pulmonary edema and probable small pleural effusions, suspicious for CHF. 2. Thoracic aortic atherosclerosis. 3. Bibasilar atelectasis or scarring. Electronically Signed   By: Jeb Levering M.D.   On: 06/28/2016 01:38   Dg Abd 2 Views  Result Date: 06/28/2016 CLINICAL DATA:  Constipation. EXAM: ABDOMEN - 2 VIEW COMPARISON:  Abdominal radiographs 06/18/2012 FINDINGS: No evidence of free air. No bowel dilatation to suggest obstruction. Moderate stool throughout the colon with small to moderate rectal stool burden. Multiple surgical clips in the pelvis. There are vascular calcifications. Osseous structures are  grossly intact. IMPRESSION: Moderate stool burden throughout the colon without evidence of obstruction or free air. Electronically Signed   By: Jeb Levering M.D.   On: 06/28/2016 01:39    Procedures Procedures (including critical care time)  Medications Ordered in ED Medications  albuterol (PROVENTIL) (2.5 MG/3ML) 0.083% nebulizer solution 5 mg (not administered)     Initial Impression / Assessment and Plan / ED Course  I have reviewed the triage vital signs and the nursing notes.  Pertinent labs & imaging results that were available during my care of the patient were reviewed by me and considered in my medical decision making (see chart for details).     Episode of SOB at home, now resolved. No chest pain.  Family concerned for anginal equivalent as similar presentation in April with NSTEMI.  No SOB.  Edema on Xray, due for dialysis this AM. LVH on EKG with stable lateral depressions.  ASA, NTG paste.  Cath in April showed patient grafts except diagonal branch with 80% stenosis that was treated medically.   Trop 0.14.  D/w Dr. Hassell Done of cardiology. She feels likely due to CHF, no indication for further ischemic workup at this time.   Will plan observation admission for serial troponins and dialysis. D/w Dr. Alcario Drought.  Final Clinical Impressions(s) / ED Diagnoses   Final diagnoses:  Dyspnea, unspecified type  Elevated troponin    New Prescriptions New Prescriptions   No medications on file   I personally performed the services described in this documentation, which was scribed in my presence. The recorded information has been reviewed and is accurate.    Ezequiel Essex, MD 06/28/16 [phone]

## 2016-06-28 NOTE — ED Notes (Signed)
Delay in lab draw,  Pt not in room at this time. 

## 2016-06-28 NOTE — H&P (Signed)
History and Physical    Aaron Long FYB:[id number] DOB: 08-Oct-1940 DOA: 06/28/2016  PCP: Patient, No Pcp Per  Patient coming from: Home  I have personally briefly reviewed patient's old medical records in [place]  Chief Complaint: SOB  HPI: Aaron Long is a 76 y.o. male with medical history significant of ESRD dialysis MWF, CHF, CAD, DM, HTN.  Patient presents to the ED with c/o sudden onset SOB that occurred while watching TV just PTA.  Had NSTEMI back in April with similar symptoms.  EMS called and symptoms completely resolved with O2 via [place] and NTG paste.  Also reports constipation.  ED Course: Trop 0.14, CXR shows pulm edema, patient symptom free at this point.   Review of Systems: As per HPI otherwise 10 point review of systems negative.   Past Medical History:  Diagnosis Date  . Abnormality of gait   . Anemia   . BPH (benign prostatic hypertrophy)   . CHF (congestive heart failure) ([place])   . Complication of anesthesia    "he gets delirious"  . Constipation    takes Miralax daily  . Coronary artery disease    a. s/p NSTEMI/CABGx4 in 2007 - LIMA-LAD, SVG-optional diagonal, SVVG-OM, SVG-dRCA. b. Nuc 05/2011: nonischemic.  . Diabetic peripheral neuropathy ([place])   . Dieulafoy lesion of rectum 05/03/2015  . Disorder of bone and cartilage, unspecified   . DVT (deep venous thrombosis) ([place])    a. Lower extremity DVT in 2013.  [name] Dyspnea   . ESRD on dialysis [facility])    M-W-F  . Gangrene of toe (HCC)    dry  . Hemorrhoids   . Hx of colonic polyps   . Hyperlipidemia   . Hypertension   . Hypertrophy of prostate without urinary obstruction and other lower urinary tract symptoms (LUTS)   . Ischemic cardiomyopathy    a. EF 40% in 2007. b. 51% by nuc 05/2011.  [name] Kidney stones   . Lower limb amputation, below knee   . Myocardial infarction ([place]) 2005  . Neuromuscular disorder ([place])    diabetic neuropathy  . NSTEMI (non-ST elevated myocardial infarction) ([place])  04/2016  . PAD (peripheral artery disease) ([place])    a. R CEA 2007. b. Hx L BKA in 2013. c. s/p LE angioplasty in 2014. d. Hx R BKA in 2014.  [name] Peripheral neuropathy   . Prostate cancer ([place]) 2010  . Secondary hyperparathyroidism ([place])    Secondary Hyperpara- Thyroidism, Renal  . Type II diabetes mellitus ([place])     Past Surgical History:  Procedure Laterality Date  . ABDOMINAL AORTAGRAM N/A 05/28/2011   Procedure: ABDOMINAL Maxcine Ham;  Surgeon: Elam Dutch, MD;  Location: [facility] CATH LAB;  Service: Cardiovascular;  Laterality: N/A;  . ABDOMINAL AORTAGRAM N/A 06/02/2012   Procedure: ABDOMINAL Maxcine Ham;  Surgeon: Elam Dutch, MD;  Location: [facility] CATH LAB;  Service: Cardiovascular;  Laterality: N/A;  . ABDOMINAL AORTAGRAM N/A 06/09/2012   Procedure: ABDOMINAL Maxcine Ham;  Surgeon: Elam Dutch, MD;  Location: [facility] CATH LAB;  Service: Cardiovascular;  Laterality: N/A;  . AMPUTATION  06/14/2011   Procedure: AMPUTATION DIGIT;  Surgeon: Elam Dutch, MD;  Location: [facility] OR;  Service: Vascular;  Laterality: Left;  Amputation Left fifth toe  . AMPUTATION  06/16/2011   Procedure: AMPUTATION BELOW KNEE;  Surgeon: Elam Dutch, MD;  Location: [place];  Service: Vascular;  Laterality: Left;  . AMPUTATION Right 06/12/2012   Procedure: AMPUTATION DIGIT;  Surgeon: Elam Dutch,  MD;  Location: [facility] OR;  Service: Vascular;  Laterality: Right;  GREAT TOE  . AMPUTATION Right 08/08/2012   Procedure: AMPUTATION BELOW KNEE;  Surgeon: Elam Dutch, MD;  Location: [place];  Service: Vascular;  Laterality: Right;  . AV FISTULA PLACEMENT Right 04/19/2014   Procedure: RIGHT ARM ARTERIOVENOUS (AV) FISTULA CREATION;  Surgeon: Mal Misty, MD;  Location: [place];  Service: Vascular;  Laterality: Right;  . AV FISTULA PLACEMENT Left 12/12/2014   Procedure: BRACHIOCEPHALIC ARTERIOVENOUS (AV) FISTULA CREATION;  Surgeon: Mal Misty, MD;  Location: [place];  Service: Vascular;  Laterality: Left;  . CARDIAC  CATHETERIZATION  05/28/11  . CARDIAC CATHETERIZATION N/A 10/04/2014   Procedure: Left Heart Cath and Coronary Angiography;  Surgeon: Sherren Mocha, MD;  Location: [place] CV LAB;  Service: Cardiovascular;  Laterality: N/A;  . CAROTID ENDARTERECTOMY Right 2005  . CATARACT EXTRACTION W/ INTRAOCULAR LENS  IMPLANT, BILATERAL Bilateral 2011  . COLONOSCOPY    . COLONOSCOPY N/A 05/03/2015   Procedure: COLONOSCOPY;  Surgeon: Gatha Mayer, MD;  Location: [place];  Service: Endoscopy;  Laterality: N/A;  . CORONARY ARTERY BYPASS GRAFT  2005   CABG X4  . ENDARTERECTOMY Left 10/17/2014   Procedure: ENDARTERECTOMY CAROTID;  Surgeon: Mal Misty, MD;  Location: [place];  Service: Vascular;  Laterality: Left;  . I&D EXTREMITY  01/31/2012   Procedure: IRRIGATION AND DEBRIDEMENT EXTREMITY;  Surgeon: Elam Dutch, MD;  Location: [place];  Service: Vascular;  Laterality: Left;  I & D Left BKA   . INSERTION OF DIALYSIS CATHETER Right 04/19/2014   Procedure: INSERTION OF DIALYSIS CATHETER-INTERNAL JUGULAR;  Surgeon: Mal Misty, MD;  Location: [place];  Service: Vascular;  Laterality: Right;  . INSERTION OF DIALYSIS CATHETER N/A 11/06/2014   Procedure: INSERTION OF 23cm DIALYSIS CATHETER - right internal jugular;  Surgeon: Mal Misty, MD;  Location: [place];  Service: Vascular;  Laterality: N/A;  . KNEE CARTILAGE SURGERY Left 1964  . LEFT HEART CATH AND CORONARY ANGIOGRAPHY N/A 04/12/2016   Procedure: Left Heart Cath and Coronary Angiography;  Surgeon: Nelva Bush, MD;  Location: [place] CV LAB;  Service: Cardiovascular;  Laterality: N/A;  . LIGATION OF ARTERIOVENOUS  FISTULA Right 11/06/2014   Procedure: LIGATION OF RADIOCEPHALIC ARTERIOVENOUS FISTULA;  Surgeon: Mal Misty, MD;  Location: [place];  Service: Vascular;  Laterality: Right;  . LIGATION OF COMPETING BRANCHES OF ARTERIOVENOUS FISTULA Right 06/19/2014   Procedure: Right Arm LIGATION OF COMPETING BRANCHES OF RADIOCEPHALIC ARTERIOVENOUS  FISTULA;  Surgeon: Mal Misty, MD;  Location: [place];  Service: Vascular;  Laterality: Right;  . PROSTATECTOMY  2009     reports that he quit smoking about 25 years ago. His smoking use included Cigarettes. He has a 30.00 pack-year smoking history. He has never used smokeless tobacco. He reports that he drinks alcohol. He reports that he does not use drugs.  Allergies  Allergen Reactions  . Oxycodone Other (See Comments)    Hallucinations    Family History  Problem Relation Age of Onset  . Hyperlipidemia Mother   . Hypertension Mother   . Cancer Mother   . Hyperlipidemia Father   . Hypertension Father   . Kidney disease Father   . Heart disease Sister   . Alzheimer's disease Sister   . Cancer Brother   . Other Sister   . Coronary artery disease Neg Hx   . Anesthesia problems Neg Hx   . Hypotension Neg Hx   .  Malignant hyperthermia Neg Hx   . Pseudochol deficiency Neg Hx      Prior to Admission medications   Medication Sig Start Date End Date Taking? Authorizing Provider  amLODipine (NORVASC) 5 MG tablet Take 1 tablet (5 mg total) by mouth daily. Take one tablet by mouth once daily 04/13/16   Rai, Ripudeep K, MD  ASPIRIN LOW DOSE 81 MG EC tablet TAKE 1 TABLET BY MOUTH EVERY MORNING 10/03/15   Reed, Tiffany L, DO  AURYXIA 1 GM 210 MG(Fe) tablet Take 420 mg by mouth 3 (three) times daily. 03/18/16   [provider]  calcium acetate (PHOSLO) 667 MG capsule TAKE 1 CAPSULE BY MOUTH THREE TIMES DAILY WITH MEALS 08/27/15   Reed, Tiffany L, DO  carvedilol (COREG) 3.125 MG tablet Take 1 tablet (3.125 mg total) by mouth 2 (two) times daily with a meal. 04/13/16   Rai, Ripudeep K, MD  clopidogrel (PLAVIX) 75 MG tablet Take one tablet by mouth once daily Patient taking differently: Take 75 mg by mouth daily.  04/05/16   Estill Dooms, MD  hydrALAZINE (APRESOLINE) 25 MG tablet Take 1 tablet (25 mg total) by mouth every 8 (eight) hours. 04/13/16   Rai, Vernelle Emerald, MD    HYDROcodone-acetaminophen (NORCO/VICODIN) 5-325 MG tablet Take 1 tablet by mouth every 6 (six) hours as needed for moderate pain. 01/09/16   Reed, Tiffany L, DO  insulin aspart (NOVOLOG) 100 UNIT/ML FlexPen Inject 5 Units into the skin 3 (three) times daily with meals. Inject 5 units three times a day with meals if blood sugar greater than 150 only, DX: 250.71 04/05/16   Estill Dooms, MD  Insulin Detemir (LEVEMIR FLEXTOUCH) 100 UNIT/ML Pen INJECT 17 UNITS SUBCUTANEOUSLY TWICE DAILY Patient taking differently: Inject 17 Units into the skin 2 (two) times daily.  04/05/16   Estill Dooms, MD  isosorbide mononitrate (IMDUR) 30 MG 24 hr tablet Take 1 tablet (30 mg total) by mouth daily. 04/14/16   Rai, Vernelle Emerald, MD  polyethylene glycol (MIRALAX) packet Take 17 g by mouth daily. 04/05/16   Estill Dooms, MD  pravastatin (PRAVACHOL) 40 MG tablet Take one tablet by mouth once daily Patient taking differently: Take 40 mg by mouth daily.  04/05/16   Estill Dooms, MD  pregabalin (LYRICA) 75 MG capsule Take one tablet by mouth once daily for pains Patient taking differently: Take 75 mg by mouth daily.  04/05/16   Estill Dooms, MD  QUEtiapine (SEROQUEL) 25 MG tablet Take 1 tablet (25 mg total) by mouth daily. 04/05/16   Estill Dooms, MD  tamsulosin (FLOMAX) 0.4 MG CAPS capsule Take one capsule by mouth once daily Patient taking differently: Take 0.4 mg by mouth daily.  04/05/16   Estill Dooms, MD    Physical Exam: Vitals:   06/28/16 0200 06/28/16 0215 06/28/16 0245 06/28/16 0315  BP: (!) 192/84 (!) 179/77 129/72 (!) 139/56  Pulse: (!) 105 (!) 102 97 93  Resp: (!) 23 16 19  (!) 8  Temp:      TempSrc:      SpO2: 91% 95% 93% (!) 89%    Constitutional: NAD, calm, comfortable Eyes: PERRL, lids and conjunctivae normal ENMT: Mucous membranes are moist. Posterior pharynx clear of any exudate or lesions.Normal dentition.  Neck: normal, supple, no masses, no thyromegaly Respiratory: clear to  auscultation bilaterally, no wheezing, no crackles. Normal respiratory effort. No accessory muscle use.  Cardiovascular: Regular rate and rhythm, no murmurs / rubs /  gallops. No extremity edema. 2+ pedal pulses. No carotid bruits.  Abdomen: no tenderness, no masses palpated. No hepatosplenomegaly. Bowel sounds positive.  Musculoskeletal: B BKAs Skin: no rashes, lesions, ulcers. No induration Neurologic: CN 2-12 grossly intact. Sensation intact, DTR normal. Strength 5/5 in all 4.  Psychiatric: Normal judgment and insight. Alert and oriented x 3. Normal mood.    Labs on Admission: I have personally reviewed following labs and imaging studies  CBC:  Recent Labs Lab 06/28/16 0146  WBC 10.1  NEUTROABS 7.4  HGB 13.3  HCT 41.0  MCV 94.7  PLT 026   Basic Metabolic Panel:  Recent Labs Lab 06/28/16 0146  NA 131*  K 4.4  CL 94*  CO2 25  GLUCOSE 366*  BUN 44*  CREATININE 6.93*  CALCIUM 10.1   GFR: CrCl cannot be calculated (Unknown ideal weight.). Liver Function Tests: No results for input(s): AST, ALT, ALKPHOS, BILITOT, PROT, ALBUMIN in the last 168 hours. No results for input(s): LIPASE, AMYLASE in the last 168 hours. No results for input(s): AMMONIA in the last 168 hours. Coagulation Profile: No results for input(s): INR, PROTIME in the last 168 hours. Cardiac Enzymes:  Recent Labs Lab 06/28/16 0146  TROPONINI 0.14*   BNP (last 3 results) No results for input(s): PROBNP in the last 8760 hours. HbA1C: No results for input(s): HGBA1C in the last 72 hours. CBG: No results for input(s): GLUCAP in the last 168 hours. Lipid Profile: No results for input(s): CHOL, HDL, LDLCALC, TRIG, CHOLHDL, LDLDIRECT in the last 72 hours. Thyroid Function Tests: No results for input(s): TSH, T4TOTAL, FREET4, T3FREE, THYROIDAB in the last 72 hours. Anemia Panel: No results for input(s): VITAMINB12, FOLATE, FERRITIN, TIBC, IRON, RETICCTPCT in the last 72 hours. Urine analysis:      Component Value Date/Time   COLORURINE YELLOW 10/15/2014 1344   APPEARANCEUR CLEAR 10/15/2014 1344   APPEARANCEUR Turbid (A) 07/12/2014 1644   LABSPEC 1.017 10/15/2014 1344   PHURINE 6.0 10/15/2014 1344   GLUCOSEU >1000 (A) 10/15/2014 1344   HGBUR SMALL (A) 10/15/2014 1344   BILIRUBINUR NEGATIVE 10/15/2014 1344   BILIRUBINUR Negative 07/12/2014 [place] 10/15/2014 1344   PROTEINUR >300 (A) 10/15/2014 1344   UROBILINOGEN 0.2 10/15/2014 1344   NITRITE NEGATIVE 10/15/2014 1344   LEUKOCYTESUR NEGATIVE 10/15/2014 1344   LEUKOCYTESUR 3+ (A) 07/12/2014 1644    Radiological Exams on Admission: Dg Chest 2 View  Result Date: 06/28/2016 CLINICAL DATA:  Shortness of breath tonight. EXAM: CHEST  2 VIEW COMPARISON:  Radiographs 330 and 04/10/2016 FINDINGS: Post median sternotomy. Stable heart size and mediastinal contours with atherosclerosis of the thoracic aorta. Increased pulmonary edema from prior exam. Streaky bibasilar atelectasis or scarring. Minimal fluid in the fissures and small pleural effusions. No pneumothorax or confluent airspace disease. Vascular stent overlies the left subclavian region. IMPRESSION: 1. Pulmonary edema and probable small pleural effusions, suspicious for CHF. 2. Thoracic aortic atherosclerosis. 3. Bibasilar atelectasis or scarring. Electronically Signed   By: Jeb Levering M.D.   On: 06/28/2016 01:38   Dg Abd 2 Views  Result Date: 06/28/2016 CLINICAL DATA:  Constipation. EXAM: ABDOMEN - 2 VIEW COMPARISON:  Abdominal radiographs 06/18/2012 FINDINGS: No evidence of free air. No bowel dilatation to suggest obstruction. Moderate stool throughout the colon with small to moderate rectal stool burden. Multiple surgical clips in the pelvis. There are vascular calcifications. Osseous structures are grossly intact. IMPRESSION: Moderate stool burden throughout the colon without evidence of obstruction or free air. Electronically Signed  By: Jeb Levering  M.D.   On: 06/28/2016 01:39    EKG: Independently reviewed.  Assessment/Plan Principal Problem:   Acute on chronic systolic and diastolic heart failure, NYHA class 4 (HCC) Active Problems:   HTN (hypertension)   CAD (coronary artery disease)   DM type 2, uncontrolled, with renal complications (HCC)   ESRD on dialysis ([place])   Pulmonary edema    1. SOB now resolved - concern for anginal equivalent given presentation in March 1. pulm edema on CXR 2. CHF is favored over NSTEMI at this point 3. Per cards: serial trops and dialysis in AM 4. Serial trops ordered 5. NTG paste 6. Tele monitor 2. ESRD - Call nephrology in AM for routine dialysis 3. CAD - 1. last cath was in April: 1 graft with 80% stenosis, medical management, other grafts of CABG are patent 2. Continue ASA and plavix 4. DM2 - 1. Half home levemir (9 units BID) 2. 3 novolog mealtime 3. Sensitive scale SSI 5. HTN - continue home BP meds, adding NTG paste as above 6. Constipation - 1. Continue daily Miralax 2. Adding daily Senna PRN  DVT prophylaxis: Heparin [place] Code Status: Full Family Communication: No family in room Disposition Plan: Home after admit Consults called: EDP called cards as above, call nephrology in AM Admission status: Place in obs   [place], [facility] Pager [phone]  If 7AM-7PM, please contact day team taking care of patient [email or web address] Password [facility]  06/28/2016, 3:45 AM

## 2016-06-28 NOTE — ED Notes (Signed)
Pt reports he has not had a BM in at least 2 weeks. Pt has taken stool softeners without relief.

## 2016-06-28 NOTE — ED Notes (Signed)
Pt to Xray via stretcher.

## 2016-06-29 DIAGNOSIS — I5043 Acute on chronic combined systolic (congestive) and diastolic (congestive) heart failure: Secondary | ICD-10-CM | POA: Diagnosis not present

## 2016-06-29 DIAGNOSIS — E1165 Type 2 diabetes mellitus with hyperglycemia: Secondary | ICD-10-CM | POA: Diagnosis not present

## 2016-06-29 DIAGNOSIS — I4891 Unspecified atrial fibrillation: Secondary | ICD-10-CM | POA: Diagnosis not present

## 2016-06-29 DIAGNOSIS — Z89511 Acquired absence of right leg below knee: Secondary | ICD-10-CM | POA: Diagnosis not present

## 2016-06-29 DIAGNOSIS — Z7902 Long term (current) use of antithrombotics/antiplatelets: Secondary | ICD-10-CM | POA: Diagnosis not present

## 2016-06-29 DIAGNOSIS — I480 Paroxysmal atrial fibrillation: Secondary | ICD-10-CM | POA: Diagnosis not present

## 2016-06-29 DIAGNOSIS — Z7982 Long term (current) use of aspirin: Secondary | ICD-10-CM | POA: Diagnosis not present

## 2016-06-29 DIAGNOSIS — E1122 Type 2 diabetes mellitus with diabetic chronic kidney disease: Secondary | ICD-10-CM | POA: Diagnosis not present

## 2016-06-29 DIAGNOSIS — Z992 Dependence on renal dialysis: Secondary | ICD-10-CM | POA: Diagnosis not present

## 2016-06-29 DIAGNOSIS — J810 Acute pulmonary edema: Secondary | ICD-10-CM

## 2016-06-29 DIAGNOSIS — I25119 Atherosclerotic heart disease of native coronary artery with unspecified angina pectoris: Secondary | ICD-10-CM | POA: Diagnosis not present

## 2016-06-29 DIAGNOSIS — N186 End stage renal disease: Secondary | ICD-10-CM | POA: Diagnosis not present

## 2016-06-29 DIAGNOSIS — Z87891 Personal history of nicotine dependence: Secondary | ICD-10-CM | POA: Diagnosis not present

## 2016-06-29 DIAGNOSIS — Z89512 Acquired absence of left leg below knee: Secondary | ICD-10-CM | POA: Diagnosis not present

## 2016-06-29 DIAGNOSIS — I132 Hypertensive heart and chronic kidney disease with heart failure and with stage 5 chronic kidney disease, or end stage renal disease: Secondary | ICD-10-CM | POA: Diagnosis not present

## 2016-06-29 DIAGNOSIS — I10 Essential (primary) hypertension: Secondary | ICD-10-CM

## 2016-06-29 DIAGNOSIS — Z794 Long term (current) use of insulin: Secondary | ICD-10-CM | POA: Diagnosis not present

## 2016-06-29 DIAGNOSIS — N2581 Secondary hyperparathyroidism of renal origin: Secondary | ICD-10-CM | POA: Diagnosis not present

## 2016-06-29 DIAGNOSIS — Z79899 Other long term (current) drug therapy: Secondary | ICD-10-CM | POA: Diagnosis not present

## 2016-06-29 DIAGNOSIS — Z885 Allergy status to narcotic agent status: Secondary | ICD-10-CM | POA: Diagnosis not present

## 2016-06-29 LAB — GLUCOSE, CAPILLARY
GLUCOSE-CAPILLARY: 258 mg/dL — AB (ref 65–99)
GLUCOSE-CAPILLARY: 190 mg/dL — AB (ref 65–99)
GLUCOSE-CAPILLARY: 143 mg/dL — AB (ref 65–99)
GLUCOSE-CAPILLARY: 157 mg/dL — AB (ref 65–99)
Glucose-Capillary: 70 mg/dL (ref 65–99)
Glucose-Capillary: 73 mg/dL (ref 65–99)
Glucose-Capillary: 116 mg/dL — ABNORMAL HIGH (ref 65–99)
Glucose-Capillary: 212 mg/dL — ABNORMAL HIGH (ref 65–99)
Glucose-Capillary: 389 mg/dL — ABNORMAL HIGH (ref 65–99)

## 2016-06-29 LAB — MRSA PCR SCREENING: MRSA BY PCR: NEGATIVE

## 2016-06-29 LAB — TROPONIN I: Troponin I: 0.16 ng/mL (ref <0.03)

## 2016-06-29 LAB — PROTIME-INR
INR: 0.98
PROTHROMBIN TIME: 13 s (ref 11.4–15.2)

## 2016-06-29 MED ORDER — HEPARIN (PORCINE) IN NACL 100-0.45 UNIT/ML-% IJ SOLN
1200.0000 [IU]/h | INTRAMUSCULAR | Status: DC
  Administered 2016-06-29 – 2016-07-02 (×4): 1000 [IU]/h via INTRAVENOUS
  Administered 2016-07-03 – 2016-07-04 (×2): 1100 [IU]/h via INTRAVENOUS
  Administered 2016-07-05 – 2016-07-06 (×2): 1200 [IU]/h via INTRAVENOUS
  Filled 2016-06-29 (×8): qty 250

## 2016-06-29 MED ORDER — CARVEDILOL 6.25 MG PO TABS
6.2500 mg | ORAL_TABLET | Freq: Two times a day (BID) | ORAL | Status: DC
  Administered 2016-06-30 – 2016-07-06 (×10): 6.25 mg via ORAL
  Filled 2016-06-29 (×10): qty 1

## 2016-06-29 MED ORDER — DEXTROSE 50 % IV SOLN
25.0000 mL | Freq: Once | INTRAVENOUS | Status: AC
  Administered 2016-06-29: 25 mL via INTRAVENOUS
  Filled 2016-06-29: qty 50

## 2016-06-29 MED ORDER — WARFARIN - PHARMACIST DOSING INPATIENT
Freq: Every day | Status: DC
  Administered 2016-07-05: 18:00:00

## 2016-06-29 MED ORDER — WARFARIN SODIUM 5 MG PO TABS
5.0000 mg | ORAL_TABLET | Freq: Once | ORAL | Status: AC
  Administered 2016-06-29: 5 mg via ORAL
  Filled 2016-06-29: qty 1

## 2016-06-29 NOTE — Consult Note (Signed)
   [facility] CM Inpatient Consult   06/29/2016  Aaron Long [birth year] [id number]   Patient was assess for post community care management needs noted on the [facility] regsitry with Medicare but not showing a primary care provider.  Spoke with the patient and his daughter, Olin Hauser,  at the bedside regarding his primary care needs.  Patient states he has a provider "now Precious Haws with [facility] or [facility], I am not sure which one." Informed them that this primary care provider was a not affiliated with [facility].  Patient confirms he did not feel he had any care management needs at this time.  For questions, please contact:  Natividad Brood, RN BSN [facility] Liaison  [phone] business mobile phone Toll free office [phone]

## 2016-06-29 NOTE — Progress Notes (Addendum)
Patient symptomatic with blood sugar of 71-- HgbA1C >10 so suspect he lives with blood sugar of 250-- will give another 1/2 amp d50.  Check ABG as well as troponin x 1, EKG done shows new onset a fib. Eulogio Bear DO

## 2016-06-29 NOTE — Progress Notes (Signed)
ANTICOAGULATION CONSULT NOTE - Initial Consult  Pharmacy Consult for heparin and coumadin Indication: atrial fibrillation  Allergies  Allergen Reactions  . Oxycodone Other (See Comments)    Hallucinations    Patient Measurements: Weight: 162 lb 4.1 oz (73.6 kg) Heparin Dosing Weight: 73.6 kg  Vital Signs: Temp: 99 F (37.2 C) (06/19 1149) Temp Source: Oral (06/19 1149) BP: 127/58 (06/19 1626) Pulse Rate: 76 (06/19 1626)  Labs:  Recent Labs  06/28/16 0146  06/28/16 1216 06/28/16 1828 06/29/16 1501  HGB 13.3  --   --   --   --   HCT 41.0  --   --   --   --   PLT 201  --   --   --   --   CREATININE 6.93*  --   --   --   --   TROPONINI 0.14*  < > 0.26* 0.32* 0.16*  < > = values in this interval not displayed.  Estimated Creatinine Clearance: 8.8 mL/min (A) (by C-G formula based on SCr of 6.93 mg/dL (H)).   Medical History: Past Medical History:  Diagnosis Date  . Abnormality of gait   . Anemia   . BPH (benign prostatic hypertrophy)   . CHF (congestive heart failure) ([place])   . Complication of anesthesia    "he gets delirious"  . Constipation    takes Miralax daily  . Coronary artery disease    a. s/p NSTEMI/CABGx4 in 2007 - LIMA-LAD, SVG-optional diagonal, SVVG-OM, SVG-dRCA. b. Nuc 05/2011: nonischemic.  . Diabetic peripheral neuropathy ([place])   . Dieulafoy lesion of rectum 05/03/2015  . Disorder of bone and cartilage, unspecified   . DVT (deep venous thrombosis) ([place])    a. Lower extremity DVT in 2013.  [name] Dyspnea   . ESRD on dialysis [facility])    M-W-F  . Gangrene of toe (HCC)    dry  . Hemorrhoids   . Hx of colonic polyps   . Hyperlipidemia   . Hypertension   . Hypertrophy of prostate without urinary obstruction and other lower urinary tract symptoms (LUTS)   . Ischemic cardiomyopathy    a. EF 40% in 2007. b. 51% by nuc 05/2011.  [name] Kidney stones   . Lower limb amputation, below knee   . Myocardial infarction ([place]) 2005  . Neuromuscular disorder ([place])    diabetic neuropathy  . NSTEMI (non-ST elevated myocardial infarction) ([place]) 04/2016  . PAD (peripheral artery disease) ([place])    a. R CEA 2007. b. Hx L BKA in 2013. c. s/p LE angioplasty in 2014. d. Hx R BKA in 2014.  [name] Peripheral neuropathy   . Prostate cancer ([place]) 2010  . Secondary hyperparathyroidism ([place])    Secondary Hyperpara- Thyroidism, Renal  . Type II diabetes mellitus ([place])     Medications:  Scheduled:  . aspirin EC  81 mg Oral q morning - 10a  . [START ON 06/30/2016] carvedilol  6.25 mg Oral BID WC  . clopidogrel  75 mg Oral Daily  . ferric citrate  420 mg Oral TID WC  . hydrALAZINE  25 mg Oral Q8H  . insulin aspart  0-9 Units Subcutaneous TID WC  . insulin aspart  3 Units Subcutaneous TID WC  . insulin detemir  13 Units Subcutaneous BID  . isosorbide mononitrate  30 mg Oral Daily  . polyethylene glycol  17 g Oral Daily  . pravastatin  40 mg Oral q1800  . pregabalin  75 mg Oral Daily  . QUEtiapine  25 mg Oral Daily  . tamsulosin  0.4 mg Oral Daily  . warfarin  5 mg Oral ONCE-1800  . Warfarin - Pharmacist Dosing Inpatient   Does not apply q1800   Infusions:  . heparin      Assessment: 76 yo male with afib will be started on heparin and coumadin.  Patient was not on coumadin prior to admission; previous INR was good.  Patient was on heparin SQ q8h and last dose was 1532.  Goal of Therapy:  Heparin level 0.3-0.7 units/ml; INR 2-3 Monitor platelets by anticoagulation protocol: Yes   Plan:  - d/c heparin SQ - Start heparin at 1000 units/hr. No bolus - Coumadin 5 mg po x1 - 8hr heparin level - daily heparin level, CBC and INR  Airik Goodlin, Tsz-Yin 06/29/2016,5:17 PM

## 2016-06-29 NOTE — Progress Notes (Signed)
Inpatient Diabetes Program Recommendations  AACE/ADA: New Consensus Statement on Inpatient Glycemic Control (2015)  Target Ranges:  Prepandial:   less than 140 mg/dL      Peak postprandial:   less than 180 mg/dL (1-2 hours)      Critically ill patients:  140 - 180 mg/dL   Results for YVAN, DORITY (MRN [id number]) as of 06/29/2016 13:33  Ref. Range 06/28/2016 07:42 06/28/2016 11:28 06/28/2016 21:11 06/29/2016 07:42 06/29/2016 11:24  Glucose-Capillary Latest Ref Range: 65 - 99 mg/dL 366 (H) 259 (H) 102 (H) 258 (H) 190 (H)   Review of Glycemic Control  Diabetes history: DM2 Outpatient Diabetes medications: Levemir 17 units BID, Novolog 5 units TID with meals Current orders for Inpatient glycemic control: Levemir 13 units BID, Novolog 0-9 units TID with meals, Novolog 3 units TID with meals for meal coverage  Inpatient Diabetes Program Recommendations: Correction (SSI): Please consider ordering Novolog 0-5 units QHS for bedtime correction scale. Insulin - Meal Coverage: Please consider increasing meal coverage to Novolog 5 units TID with meals.  Thanks, Barnie Alderman, RN, MSN, CDE Diabetes Coordinator Inpatient Diabetes Program [phone] (Team Pager from 8am to 5pm)

## 2016-06-29 NOTE — Progress Notes (Signed)
Student teaching opportunity includes instructions about limiting PO intake to 1200 ml per day due to recent fluid being pulled off from hemodialysis. All PO intake should be monitored and documented.

## 2016-06-29 NOTE — Discharge Summary (Deleted)
Physician Discharge Summary  Aaron Long:[id number] DOB: July 26, 1940 DOA: 06/28/2016  PCP: Patient, No Pcp Per  Admit date: 06/28/2016 Discharge date: 06/29/2016   Recommendations for Outpatient Follow-Up:   1. Needs stable amounts of water intake   Discharge Diagnosis:   Principal Problem:   Acute on chronic systolic and diastolic heart failure, NYHA class 4 (HCC) Active Problems:   HTN (hypertension)   CAD (coronary artery disease)   DM type 2, uncontrolled, with renal complications (HCC)   ESRD on dialysis [facility])   Pulmonary edema   Discharge disposition:  Home  Discharge Condition: Improved.  Diet recommendation: renal/carb mod  Wound care: None.   History of Present Illness:   Aaron Long is a 76 y.o. male with medical history significant of ESRD dialysis MWF, CHF, CAD, DM, HTN.  Patient presents to the ED with c/o sudden onset SOB that occurred while watching TV just PTA.  Had NSTEMI back in April with similar symptoms.  EMS called and symptoms completely resolved with O2 via [place] and NTG paste.  Also reports constipation.  ED Course: Trop 0.14, CXR shows pulm edema, patient symptom free at this point.   Hospital Course by Problem:   Dyspnea -appears to be volume overloaded -resolved after dialysis -patient admitted to drinking more water than usual over the weekend - troponins improved from last admission- not consistent with ACS/NSTEMI- had cath in April- continue ASA/plavix  ESRD -needs to lower weight to previous dry weight-- defer to outpatient nephrology  DM -resume home meds and monitoring  Constipation -resolved  Medical Consultants:    renal   Discharge Exam:   Vitals:   06/29/16 0747 06/29/16 1149  BP: 122/63 (!) 91/45  Pulse: 81 76  Resp: 16 16  Temp: 98.3 F (36.8 C) 99 F (37.2 C)   Vitals:   06/28/16 2358 06/29/16 0532 06/29/16 0747 06/29/16 1149  BP: (!) 100/45 (!) 150/69 122/63 (!) 91/45  Pulse: 80 82  81 76  Resp:  18 16 16   Temp:  98.6 F (37 C) 98.3 F (36.8 C) 99 F (37.2 C)  TempSrc:  Oral Oral Oral  SpO2:  100% 99% 96%  Weight:  73.6 kg (162 lb 4.1 oz)      Gen:  NAD    The results of significant diagnostics from this hospitalization (including imaging, microbiology, ancillary and laboratory) are listed below for reference.     Procedures and Diagnostic Studies:   Dg Chest 2 View  Result Date: 06/28/2016 CLINICAL DATA:  Shortness of breath tonight. EXAM: CHEST  2 VIEW COMPARISON:  Radiographs 330 and 04/10/2016 FINDINGS: Post median sternotomy. Stable heart size and mediastinal contours with atherosclerosis of the thoracic aorta. Increased pulmonary edema from prior exam. Streaky bibasilar atelectasis or scarring. Minimal fluid in the fissures and small pleural effusions. No pneumothorax or confluent airspace disease. Vascular stent overlies the left subclavian region. IMPRESSION: 1. Pulmonary edema and probable small pleural effusions, suspicious for CHF. 2. Thoracic aortic atherosclerosis. 3. Bibasilar atelectasis or scarring. Electronically Signed   By: Jeb Levering M.D.   On: 06/28/2016 01:38   Dg Abd 2 Views  Result Date: 06/28/2016 CLINICAL DATA:  Constipation. EXAM: ABDOMEN - 2 VIEW COMPARISON:  Abdominal radiographs 06/18/2012 FINDINGS: No evidence of free air. No bowel dilatation to suggest obstruction. Moderate stool throughout the colon with small to moderate rectal stool burden. Multiple surgical clips in the pelvis. There are vascular calcifications. Osseous structures are grossly intact. IMPRESSION: Moderate stool  burden throughout the colon without evidence of obstruction or free air. Electronically Signed   By: Jeb Levering M.D.   On: 06/28/2016 01:39     Labs:   Basic Metabolic Panel:  Recent Labs Lab 06/28/16 0146  NA 131*  K 4.4  CL 94*  CO2 25  GLUCOSE 366*  BUN 44*  CREATININE 6.93*  CALCIUM 10.1   GFR Estimated Creatinine Clearance:  8.8 mL/min (A) (by C-G formula based on SCr of 6.93 mg/dL (H)). Liver Function Tests: No results for input(s): AST, ALT, ALKPHOS, BILITOT, PROT, ALBUMIN in the last 168 hours. No results for input(s): LIPASE, AMYLASE in the last 168 hours. No results for input(s): AMMONIA in the last 168 hours. Coagulation profile No results for input(s): INR, PROTIME in the last 168 hours.  CBC:  Recent Labs Lab 06/28/16 0146  WBC 10.1  NEUTROABS 7.4  HGB 13.3  HCT 41.0  MCV 94.7  PLT 201   Cardiac Enzymes:  Recent Labs Lab 06/28/16 0146 06/28/16 0511 06/28/16 1216 06/28/16 1828  TROPONINI 0.14* 0.20* 0.26* 0.32*   BNP: Invalid input(s): POCBNP CBG:  Recent Labs Lab 06/28/16 0742 06/28/16 1128 06/28/16 2111 06/29/16 0742 06/29/16 1124  GLUCAP 366* 259* 102* 258* 190*   D-Dimer No results for input(s): DDIMER in the last 72 hours. Hgb A1c No results for input(s): HGBA1C in the last 72 hours. Lipid Profile No results for input(s): CHOL, HDL, LDLCALC, TRIG, CHOLHDL, LDLDIRECT in the last 72 hours. Thyroid function studies No results for input(s): TSH, T4TOTAL, T3FREE, THYROIDAB in the last 72 hours.  Invalid input(s): FREET3 Anemia work up No results for input(s): VITAMINB12, FOLATE, FERRITIN, TIBC, IRON, RETICCTPCT in the last 72 hours. Microbiology Recent Results (from the past 240 hour(s))  MRSA PCR Screening     Status: None   Collection Time: 06/28/16 11:49 PM  Result Value Ref Range Status   MRSA by PCR NEGATIVE NEGATIVE Final    Comment:        The GeneXpert MRSA Assay (FDA approved for NASAL specimens only), is one component of a comprehensive MRSA colonization surveillance program. It is not intended to diagnose MRSA infection nor to guide or monitor treatment for MRSA infections.      Discharge Instructions:   Discharge Instructions    Discharge instructions    Complete by:  As directed    Resume HD M/W/F Renal carb mod diet   Increase activity  slowly    Complete by:  As directed      Allergies as of 06/29/2016      Reactions   Oxycodone Other (See Comments)   Hallucinations      Medication List    STOP taking these medications   calcium acetate 667 MG capsule Commonly known as:  PHOSLO     TAKE these medications   amLODipine 5 MG tablet Commonly known as:  NORVASC Take 1 tablet (5 mg total) by mouth daily. Take one tablet by mouth once daily   ASPIRIN LOW DOSE 81 MG EC tablet Generic drug:  aspirin TAKE 1 TABLET BY MOUTH EVERY MORNING What changed:  See the new instructions.   AURYXIA 1 GM 210 MG(Fe) tablet Generic drug:  ferric citrate Take 420 mg by mouth 3 (three) times daily.   carvedilol 3.125 MG tablet Commonly known as:  COREG Take 1 tablet (3.125 mg total) by mouth 2 (two) times daily with a meal.   clopidogrel 75 MG tablet Commonly known as:  PLAVIX Take one  tablet by mouth once daily What changed:  how much to take  how to take this  when to take this  additional instructions   hydrALAZINE 25 MG tablet Commonly known as:  APRESOLINE Take 1 tablet (25 mg total) by mouth every 8 (eight) hours.   HYDROcodone-acetaminophen 5-325 MG tablet Commonly known as:  NORCO/VICODIN Take 1 tablet by mouth every 6 (six) hours as needed for moderate pain. What changed:  when to take this   insulin aspart 100 UNIT/ML FlexPen Commonly known as:  NOVOLOG Inject 5 Units into the skin 3 (three) times daily with meals. Inject 5 units three times a day with meals if blood sugar greater than 150 only, DX: 250.71   Insulin Detemir 100 UNIT/ML Pen Commonly known as:  LEVEMIR FLEXTOUCH INJECT 17 UNITS SUBCUTANEOUSLY TWICE DAILY What changed:  how much to take  how to take this  when to take this  additional instructions   isosorbide mononitrate 30 MG 24 hr tablet Commonly known as:  IMDUR Take 1 tablet (30 mg total) by mouth daily.   polyethylene glycol packet Commonly known as:  MIRALAX Take 17  g by mouth daily.   pravastatin 40 MG tablet Commonly known as:  PRAVACHOL Take one tablet by mouth once daily What changed:  how much to take  how to take this  when to take this  additional instructions   pregabalin 75 MG capsule Commonly known as:  LYRICA Take one tablet by mouth once daily for pains What changed:  how much to take  how to take this  when to take this  additional instructions   QUEtiapine 25 MG tablet Commonly known as:  SEROQUEL Take 1 tablet (25 mg total) by mouth daily.   tamsulosin 0.4 MG Caps capsule Commonly known as:  FLOMAX Take one capsule by mouth once daily What changed:  how much to take  how to take this  when to take this  additional instructions      Follow-up Information    Please follow up.            Time coordinating discharge: 25 min  Signed:  Danyle Boening U Zahara Rembert   [facility] 06/29/2016, 12:14 PM

## 2016-06-29 NOTE — Care Management Obs Status (Signed)
[place] NOTIFICATION   Patient Details  Name: FYNN VANBLARCOM MRN: [id number] Date of Birth: Mar 15, 1940   Medicare Observation Status Notification Given:  Yes    Carles Collet, RN 06/29/2016, 9:27 AM

## 2016-06-29 NOTE — Progress Notes (Signed)
(  1350pm)This Rn was called by daughter at bedside that patient suddenly became diaphoretic, and very drowsy but still arousable and follows   verbal commands. Able to raised both hands, move both feet but easily falls back to sleep. CBG  73,  V/s checked SBP 80's, O2Sats 80's ,placed on 2lnc. Patient was placed back on telemetry , rhythm  shows Afib. MD made paged aware, came to see patient made orders. Cardiology came in  To see patient. Will monitor accordingly.

## 2016-06-29 NOTE — Progress Notes (Signed)
PROGRESS NOTE    Aaron Long  EAV:[id number] DOB: [birth year] DOA: 06/28/2016 PCP: Patient, No Pcp Per   Outpatient Specialists:    Brief Narrative:  Aaron Long is a 76 y.o. male with medical history significant of ESRD dialysis MWF, CHF, CAD, DM, HTN.  Patient presents to the ED with c/o sudden onset SOB that occurred while watching TV just PTA.  Had NSTEMI back in April with similar symptoms.  EMS called and symptoms completely resolved with O2 via [place] and NTG paste.  Had hypoglycemic episode today after lunch-- responded to an amp of D50 but was found to be in a fib.  No history of a fib in chart.   Assessment & Plan:   Principal Problem:   Acute on chronic systolic and diastolic heart failure, NYHA class 4 (HCC) Active Problems:   HTN (hypertension)   CAD (coronary artery disease)   DM type 2, uncontrolled, with renal complications (HCC)   ESRD on dialysis ([place])   Pulmonary edema   New onset a fib -[place] VASC 2 at least 6 -h/o GI bleed due to [facility] lesions in 2017 -cardiology consult as recent cath and NSTEMI  Acute on chronic CHF -improved with dialysis  ESRD -dialysis yesterday  Hypoglycemia episode with uncontrolled DM -suspect patient does not follow diabetic diet at home -responded to 1 amp d50 -adjust medication and diet -2L removed   DVT prophylaxis:  SQ Heparin   Full Code   Family Communication: patient  Disposition Plan:     Consultants:   Cards  renal    Subjective: Was fine this AM-- this PM around 2:30 had an episode of hypoglycemia with lethargy   Objective: Vitals:   06/29/16 1149 06/29/16 1451 06/29/16 1453 06/29/16 1511  BP: (!) 91/45 (!) 83/38 (!) 88/51 (!) 83/41  Pulse: 76 90 89   Resp: 16 18    Temp: 99 F (37.2 C)     TempSrc: Oral     SpO2: 96% (!) 83% 100%   Weight:        Intake/Output Summary (Last 24 hours) at 06/29/16 1521 Last data filed at 06/29/16 1133  Gross per 24 hour  Intake               420 ml  Output             3076 ml  Net            -2656 ml   Filed Weights   06/28/16 1425 06/28/16 1902 06/29/16 0532  Weight: 77.3 kg (170 lb 6.7 oz) 74.5 kg (164 lb 3.9 oz) 73.6 kg (162 lb 4.1 oz)    Examination:  General exam: sweating- sleepy but will awaken and answer questions Respiratory system: Clear to auscultation. No wheezing Cardiovascular system: atrial fib this PM, rate controlled Gastrointestinal system: Abdomen is nondistended, soft and nontender. No organomegaly or masses felt. Normal bowel sounds heard. Skin: fistula in place   Data Reviewed: I have personally reviewed following labs and imaging studies  CBC:  Recent Labs Lab 06/28/16 0146  WBC 10.1  NEUTROABS 7.4  HGB 13.3  HCT 41.0  MCV 94.7  PLT 782   Basic Metabolic Panel:  Recent Labs Lab 06/28/16 0146  NA 131*  K 4.4  CL 94*  CO2 25  GLUCOSE 366*  BUN 44*  CREATININE 6.93*  CALCIUM 10.1   GFR: Estimated Creatinine Clearance: 8.8 mL/min (A) (by C-G formula based on SCr of 6.93 mg/dL (H)). Liver  Function Tests: No results for input(s): AST, ALT, ALKPHOS, BILITOT, PROT, ALBUMIN in the last 168 hours. No results for input(s): LIPASE, AMYLASE in the last 168 hours. No results for input(s): AMMONIA in the last 168 hours. Coagulation Profile: No results for input(s): INR, PROTIME in the last 168 hours. Cardiac Enzymes:  Recent Labs Lab 06/28/16 0146 06/28/16 0511 06/28/16 1216 06/28/16 1828  TROPONINI 0.14* 0.20* 0.26* 0.32*   BNP (last 3 results) No results for input(s): PROBNP in the last 8760 hours. HbA1C: No results for input(s): HGBA1C in the last 72 hours. CBG:  Recent Labs Lab 06/29/16 1124 06/29/16 1351 06/29/16 1424 06/29/16 1445 06/29/16 1508  GLUCAP 190* 73 70 116* 143*   Lipid Profile: No results for input(s): CHOL, HDL, LDLCALC, TRIG, CHOLHDL, LDLDIRECT in the last 72 hours. Thyroid Function Tests: No results for input(s): TSH, T4TOTAL, FREET4,  T3FREE, THYROIDAB in the last 72 hours. Anemia Panel: No results for input(s): VITAMINB12, FOLATE, FERRITIN, TIBC, IRON, RETICCTPCT in the last 72 hours. Urine analysis:    Component Value Date/Time   COLORURINE YELLOW 10/15/2014 1344   APPEARANCEUR CLEAR 10/15/2014 1344   APPEARANCEUR Turbid (A) 07/12/2014 1644   LABSPEC 1.017 10/15/2014 1344   PHURINE 6.0 10/15/2014 1344   GLUCOSEU >1000 (A) 10/15/2014 1344   HGBUR SMALL (A) 10/15/2014 1344   BILIRUBINUR NEGATIVE 10/15/2014 1344   BILIRUBINUR Negative 07/12/2014 [place] 10/15/2014 1344   PROTEINUR >300 (A) 10/15/2014 1344   UROBILINOGEN 0.2 10/15/2014 1344   NITRITE NEGATIVE 10/15/2014 1344   LEUKOCYTESUR NEGATIVE 10/15/2014 1344   LEUKOCYTESUR 3+ (A) 07/12/2014 1644    ) Recent Results (from the past 240 hour(s))  MRSA PCR Screening     Status: None   Collection Time: 06/28/16 11:49 PM  Result Value Ref Range Status   MRSA by PCR NEGATIVE NEGATIVE Final    Comment:        The GeneXpert MRSA Assay (FDA approved for NASAL specimens only), is one component of a comprehensive MRSA colonization surveillance program. It is not intended to diagnose MRSA infection nor to guide or monitor treatment for MRSA infections.       Anti-infectives    None       Radiology Studies: Dg Chest 2 View  Result Date: 06/28/2016 CLINICAL DATA:  Shortness of breath tonight. EXAM: CHEST  2 VIEW COMPARISON:  Radiographs 330 and 04/10/2016 FINDINGS: Post median sternotomy. Stable heart size and mediastinal contours with atherosclerosis of the thoracic aorta. Increased pulmonary edema from prior exam. Streaky bibasilar atelectasis or scarring. Minimal fluid in the fissures and small pleural effusions. No pneumothorax or confluent airspace disease. Vascular stent overlies the left subclavian region. IMPRESSION: 1. Pulmonary edema and probable small pleural effusions, suspicious for CHF. 2. Thoracic aortic atherosclerosis. 3.  Bibasilar atelectasis or scarring. Electronically Signed   By: Jeb Levering M.D.   On: 06/28/2016 01:38   Dg Abd 2 Views  Result Date: 06/28/2016 CLINICAL DATA:  Constipation. EXAM: ABDOMEN - 2 VIEW COMPARISON:  Abdominal radiographs 06/18/2012 FINDINGS: No evidence of free air. No bowel dilatation to suggest obstruction. Moderate stool throughout the colon with small to moderate rectal stool burden. Multiple surgical clips in the pelvis. There are vascular calcifications. Osseous structures are grossly intact. IMPRESSION: Moderate stool burden throughout the colon without evidence of obstruction or free air. Electronically Signed   By: Jeb Levering M.D.   On: 06/28/2016 01:39        Scheduled Meds: . aspirin EC  81 mg Oral q morning - 10a  . carvedilol  3.125 mg Oral BID WC  . clopidogrel  75 mg Oral Daily  . ferric citrate  420 mg Oral TID WC  . heparin  5,000 Units Subcutaneous Q8H  . hydrALAZINE  25 mg Oral Q8H  . insulin aspart  0-9 Units Subcutaneous TID WC  . insulin aspart  3 Units Subcutaneous TID WC  . insulin detemir  13 Units Subcutaneous BID  . isosorbide mononitrate  30 mg Oral Daily  . polyethylene glycol  17 g Oral Daily  . pravastatin  40 mg Oral q1800  . pregabalin  75 mg Oral Daily  . QUEtiapine  25 mg Oral Daily  . tamsulosin  0.4 mg Oral Daily   Continuous Infusions:   LOS: 0 days    Time spent: 35 min    [place], DO [facility] Pager [phone]  If 7PM-7AM, please contact night-coverage [email or web address] Password [email or web address] 06/29/2016, 3:21 PM

## 2016-06-29 NOTE — Consult Note (Addendum)
Cardiology Consultation:   Patient ID: Aaron Long; [id number]; Jul 24, 1940   Admit date: 06/28/2016 Date of Consult: 06/29/2016  Primary Care Provider: Patient, No Pcp Per Primary Cardiologist: Dr. Angelena Form    Patient Profile:   Aaron Long is a 76 y.o. male with a hx of MI 2005, CABG 2007, patent grafts at 04/2016, CHF with EF 35-40% 04/2016, ESRD with HD on MWF, type 2 diabetes, PAD s/p bilateral BKA and bilateral carotid endarterectomy who is being seen today for the evaluation of new onset atrial fibrillation at the request of Dr. Eliseo Squires.  History of Present Illness:   Mr. Nohr was admitted through the ED for evaluation of sudden onset of shortness of breath that occurred while watching television. EMS was called and his symptoms completely resolved with oxygen via nasal cannula and nitroglycerin paste the patient had a hypoglycemic episode today at lunch which responded to an amp of D50. He was quite diaphoretic at the time prompting the nurse to check his blood sugar. He was then noted to be in atrial fibrillation with rates in the 90s. He has no known history of atrial fib. Currently he is sleepy, falling asleep during my exam. He has no chest pain and has not had any prior to or since his admission. He had no awareness of the atrial fibrillation. The nurse noted that he was diaphoretic.  The patient was last seen in our office on 04/27/16 by Rosaria Ferries at which time he was doing well without any significant complaints.  The patient was admitted in 04/2016 with small non-STEMI by enzymes. The following evaluation was completed:  Cardiac catheterization was done on 04/12/16 by Dr. Saunders Revel which revealed widely patent bypass grafts. There is 80% stenosis involving the grafted OM proximal to the saphenous vein graft anastomosis which back fills the distal left circumflex, moderately elevated LVEDP (30 mmHg), severely reduced LV function with EF 25%. At the time aggressive medical  therapy was recommended including additional fluid removal by hemodialysis.  An echocardiogram performed on 04/11/16 showed septal and posterior lateral wall hypokinesis, widely dilated LV, mild focal basal hypertrophy of the septum, EF 20-35%, grade 1 diastolic dysfunction.  Past Medical History:  Diagnosis Date  . Abnormality of gait   . Anemia   . BPH (benign prostatic hypertrophy)   . CHF (congestive heart failure) ([place])   . Complication of anesthesia    "he gets delirious"  . Constipation    takes Miralax daily  . Coronary artery disease    a. s/p NSTEMI/CABGx4 in 2007 - LIMA-LAD, SVG-optional diagonal, SVVG-OM, SVG-dRCA. b. Nuc 05/2011: nonischemic.  . Diabetic peripheral neuropathy ([place])   . Dieulafoy lesion of rectum 05/03/2015  . Disorder of bone and cartilage, unspecified   . DVT (deep venous thrombosis) ([place])    a. Lower extremity DVT in 2013.  [name] Dyspnea   . ESRD on dialysis [facility])    M-W-F  . Gangrene of toe ([facility])    dry  . Hemorrhoids   . Hx of colonic polyps   . Hyperlipidemia   . Hypertension   . Hypertrophy of prostate without urinary obstruction and other lower urinary tract symptoms (LUTS)   . Ischemic cardiomyopathy    a. EF 40% in 2007. b. 51% by nuc 05/2011.  [name] Kidney stones   . Lower limb amputation, below knee   . Myocardial infarction ([place]) 2005  . Neuromuscular disorder ([place])    diabetic neuropathy  . NSTEMI (non-ST elevated myocardial infarction) ([place])  04/2016  . PAD (peripheral artery disease) ([place])    a. R CEA 2007. b. Hx L BKA in 2013. c. s/p LE angioplasty in 2014. d. Hx R BKA in 2014.  [name] Peripheral neuropathy   . Prostate cancer ([place]) 2010  . Secondary hyperparathyroidism ([place])    Secondary Hyperpara- Thyroidism, Renal  . Type II diabetes mellitus ([place])     Past Surgical History:  Procedure Laterality Date  . ABDOMINAL AORTAGRAM N/A 05/28/2011   Procedure: ABDOMINAL Maxcine Ham;  Surgeon: Elam Dutch, MD;  Location: [facility] CATH LAB;  Service:  Cardiovascular;  Laterality: N/A;  . ABDOMINAL AORTAGRAM N/A 06/02/2012   Procedure: ABDOMINAL Maxcine Ham;  Surgeon: Elam Dutch, MD;  Location: [facility] CATH LAB;  Service: Cardiovascular;  Laterality: N/A;  . ABDOMINAL AORTAGRAM N/A 06/09/2012   Procedure: ABDOMINAL Maxcine Ham;  Surgeon: Elam Dutch, MD;  Location: [facility] CATH LAB;  Service: Cardiovascular;  Laterality: N/A;  . AMPUTATION  06/14/2011   Procedure: AMPUTATION DIGIT;  Surgeon: Elam Dutch, MD;  Location: [facility] OR;  Service: Vascular;  Laterality: Left;  Amputation Left fifth toe  . AMPUTATION  06/16/2011   Procedure: AMPUTATION BELOW KNEE;  Surgeon: Elam Dutch, MD;  Location: [place];  Service: Vascular;  Laterality: Left;  . AMPUTATION Right 06/12/2012   Procedure: AMPUTATION DIGIT;  Surgeon: Elam Dutch, MD;  Location: [place];  Service: Vascular;  Laterality: Right;  GREAT TOE  . AMPUTATION Right 08/08/2012   Procedure: AMPUTATION BELOW KNEE;  Surgeon: Elam Dutch, MD;  Location: [place];  Service: Vascular;  Laterality: Right;  . AV FISTULA PLACEMENT Right 04/19/2014   Procedure: RIGHT ARM ARTERIOVENOUS (AV) FISTULA CREATION;  Surgeon: Mal Misty, MD;  Location: [place];  Service: Vascular;  Laterality: Right;  . AV FISTULA PLACEMENT Left 12/12/2014   Procedure: BRACHIOCEPHALIC ARTERIOVENOUS (AV) FISTULA CREATION;  Surgeon: Mal Misty, MD;  Location: [place];  Service: Vascular;  Laterality: Left;  . CARDIAC CATHETERIZATION  05/28/11  . CARDIAC CATHETERIZATION N/A 10/04/2014   Procedure: Left Heart Cath and Coronary Angiography;  Surgeon: Sherren Mocha, MD;  Location: [place] CV LAB;  Service: Cardiovascular;  Laterality: N/A;  . CAROTID ENDARTERECTOMY Right 2005  . CATARACT EXTRACTION W/ INTRAOCULAR LENS  IMPLANT, BILATERAL Bilateral 2011  . COLONOSCOPY    . COLONOSCOPY N/A 05/03/2015   Procedure: COLONOSCOPY;  Surgeon: Gatha Mayer, MD;  Location: [place];  Service: Endoscopy;  Laterality: N/A;  . CORONARY  ARTERY BYPASS GRAFT  2005   CABG X4  . ENDARTERECTOMY Left 10/17/2014   Procedure: ENDARTERECTOMY CAROTID;  Surgeon: Mal Misty, MD;  Location: [place];  Service: Vascular;  Laterality: Left;  . I&D EXTREMITY  01/31/2012   Procedure: IRRIGATION AND DEBRIDEMENT EXTREMITY;  Surgeon: Elam Dutch, MD;  Location: [place];  Service: Vascular;  Laterality: Left;  I & D Left BKA   . INSERTION OF DIALYSIS CATHETER Right 04/19/2014   Procedure: INSERTION OF DIALYSIS CATHETER-INTERNAL JUGULAR;  Surgeon: Mal Misty, MD;  Location: [place];  Service: Vascular;  Laterality: Right;  . INSERTION OF DIALYSIS CATHETER N/A 11/06/2014   Procedure: INSERTION OF 23cm DIALYSIS CATHETER - right internal jugular;  Surgeon: Mal Misty, MD;  Location: [place];  Service: Vascular;  Laterality: N/A;  . KNEE CARTILAGE SURGERY Left 1964  . LEFT HEART CATH AND CORONARY ANGIOGRAPHY N/A 04/12/2016   Procedure: Left Heart Cath and Coronary Angiography;  Surgeon: Nelva Bush, MD;  Location: [place]  CV LAB;  Service: Cardiovascular;  Laterality: N/A;  . LIGATION OF ARTERIOVENOUS  FISTULA Right 11/06/2014   Procedure: LIGATION OF RADIOCEPHALIC ARTERIOVENOUS FISTULA;  Surgeon: Mal Misty, MD;  Location: [place];  Service: Vascular;  Laterality: Right;  . LIGATION OF COMPETING BRANCHES OF ARTERIOVENOUS FISTULA Right 06/19/2014   Procedure: Right Arm LIGATION OF COMPETING BRANCHES OF RADIOCEPHALIC ARTERIOVENOUS FISTULA;  Surgeon: Mal Misty, MD;  Location: [place];  Service: Vascular;  Laterality: Right;  . PROSTATECTOMY  2009     Inpatient Medications: Scheduled Meds: . aspirin EC  81 mg Oral q morning - 10a  . carvedilol  3.125 mg Oral BID WC  . clopidogrel  75 mg Oral Daily  . ferric citrate  420 mg Oral TID WC  . heparin  5,000 Units Subcutaneous Q8H  . hydrALAZINE  25 mg Oral Q8H  . insulin aspart  0-9 Units Subcutaneous TID WC  . insulin aspart  3 Units Subcutaneous TID WC  . insulin detemir  13 Units  Subcutaneous BID  . isosorbide mononitrate  30 mg Oral Daily  . polyethylene glycol  17 g Oral Daily  . pravastatin  40 mg Oral q1800  . pregabalin  75 mg Oral Daily  . QUEtiapine  25 mg Oral Daily  . tamsulosin  0.4 mg Oral Daily   Continuous Infusions:  PRN Meds: HYDROcodone-acetaminophen, senna  Allergies:    Allergies  Allergen Reactions  . Oxycodone Other (See Comments)    Hallucinations    Social History:   Social History   Social History  . Marital status: Married    Spouse name: N/A  . Number of children: 3  . Years of education: N/A   Occupational History  . Retired-[employer]    Social History Main Topics  . Smoking status: Former Smoker    Packs/day: 1.00    Years: 30.00    Types: Cigarettes    Quit date: 04/28/1991  . Smokeless tobacco: Never Used  . Alcohol use Yes     Comment: 04/17/2014 "might have a glass of wine at Christmas"  . Drug use: No  . Sexual activity: No   Other Topics Concern  . Not on file   Social History Narrative  . No narrative on file    Family History:   [name] family history includes Alzheimer's disease in his sister; Cancer in his brother and mother; Heart disease in his sister; Hyperlipidemia in his father and mother; Hypertension in his father and mother; Kidney disease in his father; Other in his sister. There is no history of Coronary artery disease, Anesthesia problems, Hypotension, Malignant hyperthermia, or Pseudochol deficiency.  ROS:  Please see the history of present illness.  ROS  All other ROS reviewed and negative.     Physical Exam/Data:   Vitals:   06/29/16 1149 06/29/16 1451 06/29/16 1453 06/29/16 1511  BP: (!) 91/45 (!) 83/38 (!) 88/51 (!) 83/41  Pulse: 76 90 89   Resp: 16 18    Temp: 99 F (37.2 C)     TempSrc: Oral     SpO2: 96% (!) 83% 100%   Weight:        Intake/Output Summary (Last 24 hours) at 06/29/16 1551 Last data filed at 06/29/16 1133  Gross per 24 hour  Intake               420 ml  Output             3076 ml  Net            -2656 ml   Filed Weights   06/28/16 1425 06/28/16 1902 06/29/16 0532  Weight: 170 lb 6.7 oz (77.3 kg) 164 lb 3.9 oz (74.5 kg) 162 lb 4.1 oz (73.6 kg)   Body mass index is 24.67 kg/m.  General:  Well nourished, Chronically ill-appearing, in no acute distress HEENT: normal Lymph: no adenopathy Neck: no JVD Endocrine:  No thryomegaly Vascular: No carotid bruits; Cardiac:  normal S1, S2; RRR; no murmur Lungs:  clear to auscultation bilaterally, no wheezing, rhonchi or rales  Abd: soft, nontender, no hepatomegaly  Ext: no edema Musculoskeletal:  No deformities, bilateral BKA, left arm AV fistula with positive bruit and thrill Skin: warm and dry  Neuro:  CNs 2-12 intact, no focal abnormalities noted Psych:  Normal affect    EKG:  The EKG's were personally reviewed and demonstrate: 06/28/16 at 0037: Sinus tachycardia at 109 bpm with LVH and secondary repolarization abnormality, anterior Q waves, probable left atrial enlargement, QTC 468  06/29/16 at 1513: Atrial fibrillation at 94 bpm, LVH, T-wave inversions in V4-V6, QTC 497  Relevant CV Studies:  Echocardiogram 04/11/16 Study Conclusions  - Left ventricle: Septal and posterior lateral wall hypokinesis The   cavity size was mildly dilated. There was mild focal basal   hypertrophy of the septum. Systolic function was moderately   reduced. The estimated ejection fraction was in the range of 35%   to 40%. Doppler parameters are consistent with abnormal left   ventricular relaxation (grade 1 diastolic dysfunction). - Aortic valve: Likely trileaflet but not well seen - Atrial septum: No defect or patent foramen ovale was identified. ________________________________________________________  Left heart catheterization 04/12/16 Conclusions: 1. Severe native coronary artery disease, including chronic total occlusions of the proximal LAD, mid LAD, ostial LCx, and mid RCA. 2. All  bypass grafts widely patent (LIMA->LAD, SVG->diagonal, SVG->OM, and SVG->PDA). There is 80% stenosis involving the grafted OM proximal to SVG anastomosis, which backfills the distal LCx. 3. Moderately elevated LVEDP (30 mmHg). 4. Severely reduced LV contraction (LVEF ~25%).  Conclusions: 1. Aggressive medical therapy, including additional fluid removal by hemodialysis. 2. Consider addition of beta blocker and/or long-acting nitrate (in addition to other evidence-based heart failure therapies).  Laboratory Data:  Chemistry Recent Labs Lab 06/28/16 0146  NA 131*  K 4.4  CL 94*  CO2 25  GLUCOSE 366*  BUN 44*  CREATININE 6.93*  CALCIUM 10.1  GFRNONAA 7*  GFRAA 8*  ANIONGAP 12    No results for input(s): PROT, ALBUMIN, AST, ALT, ALKPHOS, BILITOT in the last 168 hours. Hematology Recent Labs Lab 06/28/16 0146  WBC 10.1  RBC 4.33  HGB 13.3  HCT 41.0  MCV 94.7  MCH 30.7  MCHC 32.4  RDW 14.7  PLT 201   Cardiac Enzymes Recent Labs Lab 06/28/16 0146 06/28/16 0511 06/28/16 1216 06/28/16 1828  TROPONINI 0.14* 0.20* 0.26* 0.32*   No results for input(s): TROPIPOC in the last 168 hours.  BNP Recent Labs Lab 06/28/16 0146  BNP >4,500.0*    DDimer No results for input(s): DDIMER in the last 168 hours.  Radiology/Studies:  Dg Chest 2 View  Result Date: 06/28/2016 CLINICAL DATA:  Shortness of breath tonight. EXAM: CHEST  2 VIEW COMPARISON:  Radiographs 330 and 04/10/2016 FINDINGS: Post median sternotomy. Stable heart size and mediastinal contours with atherosclerosis of the thoracic aorta. Increased pulmonary edema from prior exam. Streaky bibasilar atelectasis or scarring. Minimal fluid in the fissures and  small pleural effusions. No pneumothorax or confluent airspace disease. Vascular stent overlies the left subclavian region. IMPRESSION: 1. Pulmonary edema and probable small pleural effusions, suspicious for CHF. 2. Thoracic aortic atherosclerosis. 3. Bibasilar  atelectasis or scarring. Electronically Signed   By: Jeb Levering M.D.   On: 06/28/2016 01:38   Dg Abd 2 Views  Result Date: 06/28/2016 CLINICAL DATA:  Constipation. EXAM: ABDOMEN - 2 VIEW COMPARISON:  Abdominal radiographs 06/18/2012 FINDINGS: No evidence of free air. No bowel dilatation to suggest obstruction. Moderate stool throughout the colon with small to moderate rectal stool burden. Multiple surgical clips in the pelvis. There are vascular calcifications. Osseous structures are grossly intact. IMPRESSION: Moderate stool burden throughout the colon without evidence of obstruction or free air. Electronically Signed   By: Jeb Levering M.D.   On: 06/28/2016 01:39    Assessment and Plan:   New-onset atrial fibrillation -Patient had new onset of atrial fibrillation this afternoon with diaphoresis that has now resolved with patient back in sinus rhythm spontaneously. Pt had no awareness of the afib. No chest pain, shortness of breath, palpitations, lightheadedness. -EKG showed atrial fibrillation with T-wave changes laterally -A troponin was checked and was 0.16 (patient has chronically mildly elevated troponins related to end-stage renal disease). Will continue to trend troponins -CHA2DS2/VAS Stroke Risk Score at least 6 (CHF, HTN, Age (2), DM, vascular dz). Advise initiating anticoagulation with coumadin. Heparin for bridging. -Stop amlodipine to allow for uptitration of carvedilol for rate control     Chronic systolic heart failure -EF 35-40% by echo in 04/2016 -Chest x-ray on 6/18 showed pulmonary edema and probable small pleural effusions, suspicious for CHF -Fluid status is managed by hemodialysis, patient had hemodialysis yesterday -Does not appear to be significantly volume overloaded by symptoms  CAD -Patient had small non-STEMI by enzymes in 04/2016 and cardiac cath was done showing patent grafts. -History of MI in 2005, CABG in 2007, patent grafts by cath in 2016 and again  in 04/2016 -Management includes beta blocker, Plavix, aspirin 81 mg, Imdur 30 mg daily and statin Hypertension -Home medicines include amlodipine 5 mg daily, carvedilol 3.125 mg twice a day, hydralazine 25 mg 3 times a day, Imdur 30 mg daily -Patient was hypertensive on presentation however patient became hypotensive this afternoon around the time of the atrial fibrillation and currently blood pressure has improved  End-stage renal disease -Hemodialysis MWF, management per nephrology -Patient had dialysis yesterday in the hospital   Hyperlipidemia -Lipid panel on 06/03/16 showed LDL of 97 which is improved from LDL of 157 in 03/2015 -Continue pravastatin 40 mg daily  Signed, Daune Perch, NP  06/29/2016 3:51 PM   I have examined the patient and reviewed assessment and plan and discussed with patient.  Agree with above as stated.  New onset AFib.  High CHADS-vasc score, but h/o GI bleed.  Start IV heparin and increase beta blocker.  Stop amlodipine to give more BP room for beta blocker.  THis may help LV function as well.    Will need input from internal medicine about safety of starting Coumadin long term for stroke prevention.    This patients CHA2DS2-VASc Score and unadjusted Ischemic Stroke Rate (% per year) is equal to 9.7 % stroke rate/year from a score of 6  Above score calculated as 1 point each if present [CHF, HTN, DM, Vascular=MI/PAD/Aortic Plaque, Age if 65-74, or Male] Above score calculated as 2 points each if present [Age > 75, or Stroke/TIA/TE]  Larae Grooms

## 2016-06-30 DIAGNOSIS — Z7902 Long term (current) use of antithrombotics/antiplatelets: Secondary | ICD-10-CM | POA: Diagnosis not present

## 2016-06-30 DIAGNOSIS — E8770 Fluid overload, unspecified: Secondary | ICD-10-CM | POA: Diagnosis not present

## 2016-06-30 DIAGNOSIS — I252 Old myocardial infarction: Secondary | ICD-10-CM | POA: Diagnosis not present

## 2016-06-30 DIAGNOSIS — E11649 Type 2 diabetes mellitus with hypoglycemia without coma: Secondary | ICD-10-CM | POA: Diagnosis present

## 2016-06-30 DIAGNOSIS — I10 Essential (primary) hypertension: Secondary | ICD-10-CM | POA: Diagnosis not present

## 2016-06-30 DIAGNOSIS — D631 Anemia in chronic kidney disease: Secondary | ICD-10-CM | POA: Diagnosis not present

## 2016-06-30 DIAGNOSIS — Z89512 Acquired absence of left leg below knee: Secondary | ICD-10-CM | POA: Diagnosis not present

## 2016-06-30 DIAGNOSIS — K5900 Constipation, unspecified: Secondary | ICD-10-CM | POA: Diagnosis present

## 2016-06-30 DIAGNOSIS — I25119 Atherosclerotic heart disease of native coronary artery with unspecified angina pectoris: Secondary | ICD-10-CM | POA: Diagnosis not present

## 2016-06-30 DIAGNOSIS — N2581 Secondary hyperparathyroidism of renal origin: Secondary | ICD-10-CM | POA: Diagnosis present

## 2016-06-30 DIAGNOSIS — I480 Paroxysmal atrial fibrillation: Secondary | ICD-10-CM | POA: Diagnosis not present

## 2016-06-30 DIAGNOSIS — Z79899 Other long term (current) drug therapy: Secondary | ICD-10-CM | POA: Diagnosis not present

## 2016-06-30 DIAGNOSIS — I4891 Unspecified atrial fibrillation: Secondary | ICD-10-CM | POA: Diagnosis present

## 2016-06-30 DIAGNOSIS — Z951 Presence of aortocoronary bypass graft: Secondary | ICD-10-CM | POA: Diagnosis not present

## 2016-06-30 DIAGNOSIS — I5043 Acute on chronic combined systolic (congestive) and diastolic (congestive) heart failure: Secondary | ICD-10-CM | POA: Diagnosis not present

## 2016-06-30 DIAGNOSIS — Z992 Dependence on renal dialysis: Secondary | ICD-10-CM | POA: Diagnosis not present

## 2016-06-30 DIAGNOSIS — N186 End stage renal disease: Secondary | ICD-10-CM | POA: Diagnosis not present

## 2016-06-30 DIAGNOSIS — I132 Hypertensive heart and chronic kidney disease with heart failure and with stage 5 chronic kidney disease, or end stage renal disease: Secondary | ICD-10-CM | POA: Diagnosis present

## 2016-06-30 DIAGNOSIS — I255 Ischemic cardiomyopathy: Secondary | ICD-10-CM | POA: Diagnosis present

## 2016-06-30 DIAGNOSIS — I120 Hypertensive chronic kidney disease with stage 5 chronic kidney disease or end stage renal disease: Secondary | ICD-10-CM | POA: Diagnosis not present

## 2016-06-30 DIAGNOSIS — E1165 Type 2 diabetes mellitus with hyperglycemia: Secondary | ICD-10-CM | POA: Diagnosis not present

## 2016-06-30 DIAGNOSIS — R748 Abnormal levels of other serum enzymes: Secondary | ICD-10-CM | POA: Diagnosis not present

## 2016-06-30 DIAGNOSIS — I2510 Atherosclerotic heart disease of native coronary artery without angina pectoris: Secondary | ICD-10-CM | POA: Diagnosis present

## 2016-06-30 DIAGNOSIS — E1122 Type 2 diabetes mellitus with diabetic chronic kidney disease: Secondary | ICD-10-CM | POA: Diagnosis not present

## 2016-06-30 DIAGNOSIS — Z885 Allergy status to narcotic agent status: Secondary | ICD-10-CM | POA: Diagnosis not present

## 2016-06-30 DIAGNOSIS — Z87891 Personal history of nicotine dependence: Secondary | ICD-10-CM | POA: Diagnosis not present

## 2016-06-30 DIAGNOSIS — R0602 Shortness of breath: Secondary | ICD-10-CM | POA: Diagnosis not present

## 2016-06-30 DIAGNOSIS — Z7982 Long term (current) use of aspirin: Secondary | ICD-10-CM | POA: Diagnosis not present

## 2016-06-30 DIAGNOSIS — Z89511 Acquired absence of right leg below knee: Secondary | ICD-10-CM | POA: Diagnosis not present

## 2016-06-30 DIAGNOSIS — E211 Secondary hyperparathyroidism, not elsewhere classified: Secondary | ICD-10-CM | POA: Diagnosis present

## 2016-06-30 DIAGNOSIS — Z794 Long term (current) use of insulin: Secondary | ICD-10-CM | POA: Diagnosis not present

## 2016-06-30 DIAGNOSIS — D638 Anemia in other chronic diseases classified elsewhere: Secondary | ICD-10-CM | POA: Diagnosis present

## 2016-06-30 LAB — CBC
HCT: 31.8 % — ABNORMAL LOW (ref 39.0–52.0)
HEMOGLOBIN: 10.1 g/dL — AB (ref 13.0–17.0)
MCH: 30 pg (ref 26.0–34.0)
MCHC: 31.8 g/dL (ref 30.0–36.0)
MCV: 94.4 fL (ref 78.0–100.0)
Platelets: 206 10*3/uL (ref 150–400)
RBC: 3.37 MIL/uL — AB (ref 4.22–5.81)
RDW: 15 % (ref 11.5–15.5)
WBC: 5.8 10*3/uL (ref 4.0–10.5)

## 2016-06-30 LAB — RENAL FUNCTION PANEL
Albumin: 2.9 g/dL — ABNORMAL LOW (ref 3.5–5.0)
Anion gap: 12 (ref 5–15)
BUN: 45 mg/dL — ABNORMAL HIGH (ref 6–20)
CHLORIDE: 95 mmol/L — AB (ref 101–111)
CO2: 24 mmol/L (ref 22–32)
CREATININE: 6.69 mg/dL — AB (ref 0.61–1.24)
Calcium: 9.1 mg/dL (ref 8.9–10.3)
GFR calc Af Amer: 8 mL/min — ABNORMAL LOW (ref >60)
GFR calc non Af Amer: 7 mL/min — ABNORMAL LOW (ref >60)
GLUCOSE: 235 mg/dL — AB (ref 65–99)
Phosphorus: 4.7 mg/dL — ABNORMAL HIGH (ref 2.5–4.6)
Potassium: 4 mmol/L (ref 3.5–5.1)
SODIUM: 131 mmol/L — AB (ref 135–145)

## 2016-06-30 LAB — GLUCOSE, CAPILLARY
GLUCOSE-CAPILLARY: 133 mg/dL — AB (ref 65–99)
Glucose-Capillary: 206 mg/dL — ABNORMAL HIGH (ref 65–99)
Glucose-Capillary: 248 mg/dL — ABNORMAL HIGH (ref 65–99)

## 2016-06-30 LAB — HEPARIN LEVEL (UNFRACTIONATED)
HEPARIN UNFRACTIONATED: 0.58 [IU]/mL (ref 0.30–0.70)
Heparin Unfractionated: 0.36 IU/mL (ref 0.30–0.70)

## 2016-06-30 LAB — PROTIME-INR
INR: 1.01
PROTHROMBIN TIME: 13.3 s (ref 11.4–15.2)

## 2016-06-30 MED ORDER — SODIUM CHLORIDE 0.9 % IV SOLN: 100.0000 mL | INTRAVENOUS | Status: DC | PRN

## 2016-06-30 MED ORDER — LIDOCAINE HCL (PF) 1 % IJ SOLN: 5.0000 mL | INTRAMUSCULAR | Status: DC | PRN

## 2016-06-30 MED ORDER — ALBUMIN HUMAN 25 % IV SOLN
12.5000 g | Freq: Once | INTRAVENOUS | Status: AC
  Administered 2016-06-30: 12.5 g via INTRAVENOUS

## 2016-06-30 MED ORDER — WARFARIN SODIUM 5 MG PO TABS
5.0000 mg | ORAL_TABLET | Freq: Once | ORAL | Status: AC
  Administered 2016-06-30: 5 mg via ORAL
  Filled 2016-06-30: qty 1

## 2016-06-30 MED ORDER — PENTAFLUOROPROP-TETRAFLUOROETH EX AERO: 1.0000 "application " | INHALATION_SPRAY | CUTANEOUS | Status: DC | PRN

## 2016-06-30 MED ORDER — ALTEPLASE 2 MG IJ SOLR: 2.0000 mg | Freq: Once | INTRAMUSCULAR | Status: DC | PRN

## 2016-06-30 MED ORDER — ALBUMIN HUMAN 25 % IV SOLN
INTRAVENOUS | Status: AC
  Administered 2016-06-30: 12.5 g via INTRAVENOUS
  Filled 2016-06-30: qty 50

## 2016-06-30 MED ORDER — ALBUMIN HUMAN 25 % IV SOLN
25.0000 g | Freq: Once | INTRAVENOUS | Status: DC
  Filled 2016-06-30: qty 100

## 2016-06-30 MED ORDER — LIDOCAINE-PRILOCAINE 2.5-2.5 % EX CREA: 1.0000 "application " | TOPICAL_CREAM | CUTANEOUS | Status: DC | PRN

## 2016-06-30 MED ORDER — HEPARIN SODIUM (PORCINE) 1000 UNIT/ML DIALYSIS: 1000.0000 [IU] | INTRAMUSCULAR | Status: DC | PRN

## 2016-06-30 MED ORDER — PATIENT'S GUIDE TO USING COUMADIN BOOK
Freq: Once | Status: AC
  Administered 2016-06-30: 20:00:00
  Filled 2016-06-30: qty 1

## 2016-06-30 NOTE — Progress Notes (Signed)
ANTICOAGULATION CONSULT NOTE - Follow Up Consult  Pharmacy Consult for Heparin and Coumadin Indication: atrial fibrillation  Allergies  Allergen Reactions  . Oxycodone Other (See Comments)    Hallucinations    Patient Measurements: Height: 5\' 8"  (172.7 cm) Weight: 164 lb 12.8 oz (74.8 kg) IBW/kg (Calculated) : 68.4 Heparin Dosing Weight: 74 kg  Vital Signs: Temp: 97.9 F (36.6 C) (06/20 1451) Temp Source: Oral (06/20 1451) BP: 99/55 (06/20 1451) Pulse Rate: 77 (06/20 1451)  Labs:  Recent Labs  06/28/16 0146  06/28/16 1216 06/28/16 1828 06/29/16 1501 06/29/16 1800 06/30/16 0150 06/30/16 1027 06/30/16 1513  HGB 13.3  --   --   --   --   --   --   --  10.1*  HCT 41.0  --   --   --   --   --   --   --  31.8*  PLT 201  --   --   --   --   --   --   --  206  LABPROT  --   --   --   --   --  13.0 13.3  --   --   INR  --   --   --   --   --  0.98 1.01  --   --   HEPARINUNFRC  --   --   --   --   --   --  0.58 0.36  --   CREATININE 6.93*  --   --   --   --   --   --   --  6.69*  TROPONINI 0.14*  < > 0.26* 0.32* 0.16*  --   --   --   --   < > = values in this interval not displayed.  Estimated Creatinine Clearance: 9.1 mL/min (A) (by C-G formula based on SCr of 6.69 mg/dL (H)).  Assessment:   Heparin and Coumadin begin on 6/19 for new onset atrial fibrillation.   Initial heparin level therapeutic (0.36) on 1000 units/hr.  Down from initial level of 0.58 on same rate.  INR 1.01 after Coumadin 5 mg x 1.   Hx bleeding during HD session noted, as well as hx GI bleed in 2017. Plavix stopped, to continue ASA 81 mg.  Will dose Coumadin conservatively for now.  Goal of Therapy:  INR 2-3 Heparin level 0.3-0.7 units/ml Monitor platelets by anticoagulation protocol: Yes   Plan:   Continue heparin drip at 1000 units/hr.  Coumadin 5 mg again tonight.   Daily heparin level, CBC and PT/INR.  Arty Baumgartner, [place] Pager: [phone] 06/30/2016,3:51 PM

## 2016-06-30 NOTE — Progress Notes (Signed)
HD tx initiated via 15G x2 w/o problem, pull/push/flush w/o problem, will cont to monitor while on HD tx 

## 2016-06-30 NOTE — Progress Notes (Signed)
Inpatient Diabetes Program Recommendations  AACE/ADA: New Consensus Statement on Inpatient Glycemic Control (2015)  Target Ranges:  Prepandial:   less than 140 mg/dL      Peak postprandial:   less than 180 mg/dL (1-2 hours)      Critically ill patients:  140 - 180 mg/dL   Results for YEHOSHUA, VITELLI (MRN [id number]) as of 06/30/2016 10:42  Ref. Range 06/29/2016 07:42 06/29/2016 11:24 06/29/2016 13:51 06/29/2016 14:24 06/29/2016 14:45 06/29/2016 15:08 06/29/2016 16:19 06/29/2016 18:12 06/29/2016 21:31 06/30/2016 07:39  Glucose-Capillary Latest Ref Range: 65 - 99 mg/dL 258 (H) 190 (H) 73 70 116 (H) 143 (H) 157 (H) 212 (H) 389 (H) 206 (H)   Review of Glycemic Control  Diabetes history: DM2 Outpatient Diabetes medications:  Levemir 17 units BID, Novolog 5 units TID with meals Current orders for Inpatient glycemic control: Levemir 13 units BID, Novolog 0-9 units TID with meals, Novolog 3 units TID with meals for meal coverage  Inpatient Diabetes Program Recommendations:  Correction (SSI): Bedtime glucose up to 389 mg/dl last night but no Novolog given since no bedtime correction ordered. Please consider ordering Novolog 0-5 units QHS for bedtime correction scale. Insulin - Meal Coverage: Noted glucose of 70 mg/dl at 14:24 on 06/29/16 after getting Novolog 5 units at 12:18 (for meal coverage and correction but no meal intake documented). NURSING: Please be sure patient eats at least 50% of meals before administering meal coverage insulin.  Thanks, Barnie Alderman, RN, MSN, CDE Diabetes Coordinator Inpatient Diabetes Program [phone] (Team Pager from 8am to 5pm)

## 2016-06-30 NOTE — Progress Notes (Signed)
[facility] ROUNDING NOTE   Subjective:   Interval History:  Aaron Long a 76 y.o.malewith medical history significant of ESRD dialysis MWF, CHF, CAD, DM, HTN. Patient presents to the ED with c/o sudden onset SOB that occurred while watching TV just PTA. Had NSTEMI back in April with similar symptoms. EMS called and symptoms completely resolved with O2 via [place] and NTG paste.  Initally improved in hospital but had hypoglycemic episode after lunch-- responded to an amp of D50 but was found to be in a fib.  No history of a fib in chart. Now on heparin drip   Objective:  Vital signs in last 24 hours:  Temp:  [98.3 F (36.8 C)-99.3 F (37.4 C)] 99.3 F (37.4 C) (06/20 1149) Pulse Rate:  [76-90] 80 (06/20 1149) Resp:  [16-18] 18 (06/20 1149) BP: (83-132)/(38-62) 117/55 (06/20 1149) SpO2:  [83 %-100 %] 98 % (06/20 1149) Weight:  [164 lb 12.8 oz (74.8 kg)] 164 lb 12.8 oz (74.8 kg) (06/19 1900)  Weight change: -5 lb 9.9 oz (-2.547 kg) Filed Weights   06/28/16 1902 06/29/16 0532 06/29/16 1900  Weight: 164 lb 3.9 oz (74.5 kg) 162 lb 4.1 oz (73.6 kg) 164 lb 12.8 oz (74.8 kg)    Intake/Output: I/O last 3 completed shifts: In: 633.7 [P.O.:540; I.V.:93.7] Out: 3076 [Urine:250; Other:2826]   Intake/Output this shift:  Total I/O In: 240 [P.O.:240] Out: 300 [Urine:300]  CVS- RRR RS- CTA ABD- BS present soft non-distended EXT- no edema   Basic Metabolic Panel:  Recent Labs Lab 06/28/16 0146  NA 131*  K 4.4  CL 94*  CO2 25  GLUCOSE 366*  BUN 44*  CREATININE 6.93*  CALCIUM 10.1    Liver Function Tests: No results for input(s): AST, ALT, ALKPHOS, BILITOT, PROT, ALBUMIN in the last 168 hours. No results for input(s): LIPASE, AMYLASE in the last 168 hours. No results for input(s): AMMONIA in the last 168 hours.  CBC:  Recent Labs Lab 06/28/16 0146  WBC 10.1  NEUTROABS 7.4  HGB 13.3  HCT 41.0  MCV 94.7  PLT 201    Cardiac Enzymes:  Recent  Labs Lab 06/28/16 0146 06/28/16 0511 06/28/16 1216 06/28/16 1828 06/29/16 1501  TROPONINI 0.14* 0.20* 0.26* 0.32* 0.16*    BNP: Invalid input(s): POCBNP  CBG:  Recent Labs Lab 06/29/16 1619 06/29/16 1812 06/29/16 2131 06/30/16 0739 06/30/16 1124  GLUCAP 157* 212* 389* 206* 248*    Microbiology: Results for orders placed or performed during the hospital encounter of 06/28/16  MRSA PCR Screening     Status: None   Collection Time: 06/28/16 11:49 PM  Result Value Ref Range Status   MRSA by PCR NEGATIVE NEGATIVE Final    Comment:        The GeneXpert MRSA Assay (FDA approved for NASAL specimens only), is one component of a comprehensive MRSA colonization surveillance program. It is not intended to diagnose MRSA infection nor to guide or monitor treatment for MRSA infections.     Coagulation Studies:  Recent Labs  06/29/16 1800 06/30/16 0150  LABPROT 13.0 13.3  INR 0.98 1.01    Urinalysis: No results for input(s): COLORURINE, LABSPEC, PHURINE, GLUCOSEU, HGBUR, BILIRUBINUR, KETONESUR, PROTEINUR, UROBILINOGEN, NITRITE, LEUKOCYTESUR in the last 72 hours.  Invalid input(s): APPERANCEUR    Imaging: No results found.   Medications:   . heparin 1,000 Units/hr (06/29/16 1738)   . aspirin EC  81 mg Oral q morning - 10a  . carvedilol  6.25 mg Oral  BID WC  . ferric citrate  420 mg Oral TID WC  . hydrALAZINE  25 mg Oral Q8H  . insulin aspart  0-9 Units Subcutaneous TID WC  . insulin aspart  3 Units Subcutaneous TID WC  . insulin detemir  13 Units Subcutaneous BID  . isosorbide mononitrate  30 mg Oral Daily  . polyethylene glycol  17 g Oral Daily  . pravastatin  40 mg Oral q1800  . pregabalin  75 mg Oral Daily  . QUEtiapine  25 mg Oral Daily  . tamsulosin  0.4 mg Oral Daily  . Warfarin - Pharmacist Dosing Inpatient   Does not apply q1800   HYDROcodone-acetaminophen, senna  Assessment/ Plan:  1. Volume overload and atrial fibrillation on heparin drip   2. ESRD - NL MWF HD schedule - hd today  [place]  3. Hypertension/volume -  Challenge dry weight  Coreg and amlodipine as antihypertensives  4. Anemia - HGB  13 no ESA 5. Metabolic bone disease - hypercalcemia no vitamin D or calcium based binders  6. CAD - on Plavix  7. DM type 2 - per admit  8. HO bipolar -on Seroquel     LOS: 0 Laurianne Floresca W @TODAY @1 :51 PM

## 2016-06-30 NOTE — Progress Notes (Signed)
HD tx completed @ 1900 w/ bp issues throughout, UF goal not met, blood rinsed back, report given to Francene Finders, RN

## 2016-06-30 NOTE — Progress Notes (Signed)
PROGRESS NOTE    Aaron Long  HFW:[id number] DOB: [birth year] DOA: 06/28/2016 PCP: Patient, No Pcp Per   Outpatient Specialists:    Brief Narrative:  Aaron Long is a 76 y.o. male with medical history significant of ESRD dialysis MWF, CHF, CAD, DM, HTN.  Patient presents to the ED with c/o sudden onset SOB that occurred while watching TV just PTA.  Had NSTEMI back in April with similar symptoms.  EMS called and symptoms completely resolved with O2 via  and NTG paste.  Initally improved in hospital but had hypoglycemic episode after lunch-- responded to an amp of D50 but was found to be in a fib.  No history of a fib in chart.   Assessment & Plan:   Principal Problem:   Acute on chronic systolic and diastolic heart failure, NYHA class 4 (HCC) Active Problems:   HTN (hypertension)   CAD (coronary artery disease)   DM type 2, uncontrolled, with renal complications (HCC)   ESRD on dialysis ([place])   Pulmonary edema   Atrial fibrillation ([place])   New onset a fib -[place] VASC 2 at least 6 -h/o GI bleed due to [facility] lesions in 2017- discussed with on-call cardiology and ok to start anticoagulation -cardiology consult appreciated  -plan to continue coumadin (goal 2-3) with heparin bridge- will need close monitoring for bleeding  Acute on chronic CHF -improved with dialysis  ESRD -dialysis M/W/F  Hypoglycemia episode with uncontrolled DM -suspect patient does not follow diabetic diet at home -responded to 1 amp d50 -adjust medication and diet    DVT prophylaxis:  SQ Heparin   Full Code   Family Communication: patient  Disposition Plan:     Consultants:   Cards  renal    Subjective: Back to normal today, no SOB, no CP   Objective: Vitals:   06/29/16 1626 06/29/16 1900 06/30/16 0720 06/30/16 1149  BP: (!) 127/58 127/62 132/60 (!) 117/55  Pulse: 76 77 79 80  Resp: 16 17 18 18   Temp:  98.3 F (36.8 C)  99.3 F (37.4 C)  TempSrc:  Oral   Oral  SpO2: 99% 98% 98% 98%  Weight:  74.8 kg (164 lb 12.8 oz)      Intake/Output Summary (Last 24 hours) at 06/30/16 1313 Last data filed at 06/30/16 0900  Gross per 24 hour  Intake           453.67 ml  Output              300 ml  Net           153.67 ml   Filed Weights   06/28/16 1902 06/29/16 0532 06/29/16 1900  Weight: 74.5 kg (164 lb 3.9 oz) 73.6 kg (162 lb 4.1 oz) 74.8 kg (164 lb 12.8 oz)    Examination:  General exam: awake, in bed, NAD Respiratory system: Clear to auscultation. No wheezing Cardiovascular system: back in sinus Gastrointestinal system: Abdomen is nondistended, soft and nontender. No organomegaly or masses felt. Normal bowel sounds heard. Skin: no rashes   Data Reviewed: I have personally reviewed following labs and imaging studies  CBC:  Recent Labs Lab 06/28/16 0146  WBC 10.1  NEUTROABS 7.4  HGB 13.3  HCT 41.0  MCV 94.7  PLT 027   Basic Metabolic Panel:  Recent Labs Lab 06/28/16 0146  NA 131*  K 4.4  CL 94*  CO2 25  GLUCOSE 366*  BUN 44*  CREATININE 6.93*  CALCIUM 10.1   GFR: Estimated  Creatinine Clearance: 8.8 mL/min (A) (by C-G formula based on SCr of 6.93 mg/dL (H)). Liver Function Tests: No results for input(s): AST, ALT, ALKPHOS, BILITOT, PROT, ALBUMIN in the last 168 hours. No results for input(s): LIPASE, AMYLASE in the last 168 hours. No results for input(s): AMMONIA in the last 168 hours. Coagulation Profile:  Recent Labs Lab 06/29/16 1800 06/30/16 0150  INR 0.98 1.01   Cardiac Enzymes:  Recent Labs Lab 06/28/16 0146 06/28/16 0511 06/28/16 1216 06/28/16 1828 06/29/16 1501  TROPONINI 0.14* 0.20* 0.26* 0.32* 0.16*   BNP (last 3 results) No results for input(s): PROBNP in the last 8760 hours. HbA1C: No results for input(s): HGBA1C in the last 72 hours. CBG:  Recent Labs Lab 06/29/16 1619 06/29/16 1812 06/29/16 2131 06/30/16 0739 06/30/16 1124  GLUCAP 157* 212* 389* 206* 248*   Lipid  Profile: No results for input(s): CHOL, HDL, LDLCALC, TRIG, CHOLHDL, LDLDIRECT in the last 72 hours. Thyroid Function Tests: No results for input(s): TSH, T4TOTAL, FREET4, T3FREE, THYROIDAB in the last 72 hours. Anemia Panel: No results for input(s): VITAMINB12, FOLATE, FERRITIN, TIBC, IRON, RETICCTPCT in the last 72 hours. Urine analysis:    Component Value Date/Time   COLORURINE YELLOW 10/15/2014 1344   APPEARANCEUR CLEAR 10/15/2014 1344   APPEARANCEUR Turbid (A) 07/12/2014 1644   LABSPEC 1.017 10/15/2014 1344   PHURINE 6.0 10/15/2014 1344   GLUCOSEU >1000 (A) 10/15/2014 1344   HGBUR SMALL (A) 10/15/2014 1344   BILIRUBINUR NEGATIVE 10/15/2014 1344   BILIRUBINUR Negative 07/12/2014 [place] 10/15/2014 1344   PROTEINUR >300 (A) 10/15/2014 1344   UROBILINOGEN 0.2 10/15/2014 1344   NITRITE NEGATIVE 10/15/2014 1344   LEUKOCYTESUR NEGATIVE 10/15/2014 1344   LEUKOCYTESUR 3+ (A) 07/12/2014 1644    ) Recent Results (from the past 240 hour(s))  MRSA PCR Screening     Status: None   Collection Time: 06/28/16 11:49 PM  Result Value Ref Range Status   MRSA by PCR NEGATIVE NEGATIVE Final    Comment:        The GeneXpert MRSA Assay (FDA approved for NASAL specimens only), is one component of a comprehensive MRSA colonization surveillance program. It is not intended to diagnose MRSA infection nor to guide or monitor treatment for MRSA infections.       Anti-infectives    None       Radiology Studies: No results found.      Scheduled Meds: . aspirin EC  81 mg Oral q morning - 10a  . carvedilol  6.25 mg Oral BID WC  . ferric citrate  420 mg Oral TID WC  . hydrALAZINE  25 mg Oral Q8H  . insulin aspart  0-9 Units Subcutaneous TID WC  . insulin aspart  3 Units Subcutaneous TID WC  . insulin detemir  13 Units Subcutaneous BID  . isosorbide mononitrate  30 mg Oral Daily  . polyethylene glycol  17 g Oral Daily  . pravastatin  40 mg Oral q1800  .  pregabalin  75 mg Oral Daily  . QUEtiapine  25 mg Oral Daily  . tamsulosin  0.4 mg Oral Daily  . Warfarin - Pharmacist Dosing Inpatient   Does not apply q1800   Continuous Infusions: . heparin 1,000 Units/hr (06/29/16 1738)     LOS: 0 days    Time spent: 35 min    [place], DO [facility] Pager [phone]  If 7PM-7AM, please contact night-coverage [email or web address] Password [email or web address] 06/30/2016, 1:13 PM

## 2016-06-30 NOTE — Progress Notes (Signed)
Progress Note  Patient Name: Aaron Long Date of Encounter: 06/30/2016  Primary Cardiologist: Dr. Angelena Form   Subjective   He had a brief episode of shortness of breath, rhythm at that time showed A. fib. No chest pain, palpitation or blood in his stool or urine.  Inpatient Medications    Scheduled Meds: . aspirin EC  81 mg Oral q morning - 10a  . carvedilol  6.25 mg Oral BID WC  . clopidogrel  75 mg Oral Daily  . ferric citrate  420 mg Oral TID WC  . hydrALAZINE  25 mg Oral Q8H  . insulin aspart  0-9 Units Subcutaneous TID WC  . insulin aspart  3 Units Subcutaneous TID WC  . insulin detemir  13 Units Subcutaneous BID  . isosorbide mononitrate  30 mg Oral Daily  . polyethylene glycol  17 g Oral Daily  . pravastatin  40 mg Oral q1800  . pregabalin  75 mg Oral Daily  . QUEtiapine  25 mg Oral Daily  . tamsulosin  0.4 mg Oral Daily  . Warfarin - Pharmacist Dosing Inpatient   Does not apply q1800   Continuous Infusions: . heparin 1,000 Units/hr (06/29/16 1738)   PRN Meds: HYDROcodone-acetaminophen, senna   Vital Signs    Vitals:   06/29/16 1511 06/29/16 1626 06/29/16 1900 06/30/16 0720  BP: (!) 83/41 (!) 127/58 127/62 132/60  Pulse:  76 77 79  Resp:  16 17 18   Temp:   98.3 F (36.8 C)   TempSrc:   Oral   SpO2:  99% 98% 98%  Weight:   164 lb 12.8 oz (74.8 kg)     Intake/Output Summary (Last 24 hours) at 06/30/16 0910 Last data filed at 06/30/16 7341  Gross per 24 hour  Intake           633.67 ml  Output                0 ml  Net           633.67 ml   Filed Weights   06/28/16 1902 06/29/16 0532 06/29/16 1900  Weight: 164 lb 3.9 oz (74.5 kg) 162 lb 4.1 oz (73.6 kg) 164 lb 12.8 oz (74.8 kg)    Telemetry    NSR with intermittent afib yesterday - Personally Reviewed  ECG    None today   Physical Exam   GEN: Chronically ill appearing male in no acute distress.   Neck: No JVD Cardiac: RRR, no murmurs, rubs, or gallops.  Respiratory: Clear to  auscultation bilaterally. GI: Soft, nontender, non-distended  MS: No deformities, bilateral BKA, left arm AV fistula with positive bruit and thrill Neuro:  Nonfocal  Psych: Normal affect   Labs    Chemistry Recent Labs Lab 06/28/16 0146  NA 131*  K 4.4  CL 94*  CO2 25  GLUCOSE 366*  BUN 44*  CREATININE 6.93*  CALCIUM 10.1  GFRNONAA 7*  GFRAA 8*  ANIONGAP 12     Hematology Recent Labs Lab 06/28/16 0146  WBC 10.1  RBC 4.33  HGB 13.3  HCT 41.0  MCV 94.7  MCH 30.7  MCHC 32.4  RDW 14.7  PLT 201    Cardiac Enzymes Recent Labs Lab 06/28/16 0511 06/28/16 1216 06/28/16 1828 06/29/16 1501  TROPONINI 0.20* 0.26* 0.32* 0.16*   No results for input(s): TROPIPOC in the last 168 hours.   BNP Recent Labs Lab 06/28/16 0146  BNP >4,500.0*     DDimer No results for  input(s): DDIMER in the last 168 hours.   Radiology    No results found.  Cardiac Studies   Echocardiogram 04/11/16 Study Conclusions  - Left ventricle: Septal and posterior lateral wall hypokinesis The cavity size was mildly dilated. There was mild focal basal hypertrophy of the septum. Systolic function was moderately reduced. The estimated ejection fraction was in the range of 35% to 40%. Doppler parameters are consistent with abnormal left ventricular relaxation (grade 1 diastolic dysfunction). - Aortic valve: Likely trileaflet but not well seen - Atrial septum: No defect or patent foramen ovale was identified. ________________________________________________________  Left heart catheterization 04/12/16 Conclusions: 1. Severe native coronary artery disease, including chronic total occlusions of the proximal LAD, mid LAD, ostial LCx, and mid RCA. 2. All bypass grafts widely patent (LIMA->LAD, SVG->diagonal, SVG->OM, and SVG->PDA). There is 80% stenosis involving the grafted OM proximal to SVG anastomosis, which backfills the distal LCx. 3. Moderately elevated LVEDP (30  mmHg). 4. Severely reduced LV contraction (LVEF ~25%).  Conclusions: 1. Aggressive medical therapy, including additional fluid removal by hemodialysis. 2. Consider addition of beta blocker and/or long-acting nitrate (in addition to other evidence-based heart failure therapies).  Patient Profile     Aaron Long is a 76 y.o. male with a hx of MI 2005, CABG 2007, patent grafts at 04/2016, CHF with EF 35-40% 04/2016, ESRD with HD on MWF, type 2 diabetes, PAD s/p bilateral BKA and bilateral carotid endarterectomy who is being seen for the evaluation of new onset atrial fibrillation.   Assessment & Plan    1. New onset atrial fibrillation - He gets dyspneic when goes into A. fib. No palpitation, chest pain or dizziness. CHADSVASC score of 6. Started on Coumadin with heparin bridge yesterday. No bleeding complication. - He does have a history of bleeding during dialysis session on aspirin and Plavix. Now on Coumadin and with heparin bridge. Reviewed with Dr.Berkleigh Beckles will discontinue of Plavix.. Monitor closely for bleeding tissue. - Increase Coreg to 6.25 mg yesterday. If recurrent symptoms--> may consider further up titration of beta blocker versus addition of antiarrhythmic. - Check TSH. Echo last admission as above.  2. Chronic systolic heart failure - EF of 35-40% by echo 04/2016. Chest x-ray on 6/18 showed pulmonary edema and probable small pleural effusions, suspicious for CHF. Volume managed by hemodialysis. Volume status is relatively stable on exam.  3. CAD s/p CABG - Last cath 04/2016 as above--> plan for medical therapy. No angina. Continue ASA. Stop plavix due to need of anticoagulation. Continue Imdur and beta blocker.   4. HTN -Stable today on current regimen  5. ESRD on HD    Signed, [place], [place]  06/30/2016, 9:10 AM    I have examined the patient and reviewed assessment and plan and discussed with patient.  Agree with above as stated.  Maintaining NSR now.   Starting on Coumadin.  Dialysis for ESRD.  High risk for stroke in the setting of AFib. No ischemic w/u planned at this time.  Larae Grooms

## 2016-07-01 LAB — CBC
HCT: 36.3 % — ABNORMAL LOW (ref 39.0–52.0)
Hemoglobin: 11.2 g/dL — ABNORMAL LOW (ref 13.0–17.0)
MCH: 29.9 pg (ref 26.0–34.0)
MCHC: 30.9 g/dL (ref 30.0–36.0)
MCV: 96.8 fL (ref 78.0–100.0)
PLATELETS: 229 10*3/uL (ref 150–400)
RBC: 3.75 MIL/uL — ABNORMAL LOW (ref 4.22–5.81)
RDW: 15.5 % (ref 11.5–15.5)
WBC: 5.6 10*3/uL (ref 4.0–10.5)

## 2016-07-01 LAB — GLUCOSE, CAPILLARY
GLUCOSE-CAPILLARY: 111 mg/dL — AB (ref 65–99)
GLUCOSE-CAPILLARY: 126 mg/dL — AB (ref 65–99)
Glucose-Capillary: 169 mg/dL — ABNORMAL HIGH (ref 65–99)
Glucose-Capillary: 96 mg/dL (ref 65–99)

## 2016-07-01 LAB — HEPARIN LEVEL (UNFRACTIONATED): Heparin Unfractionated: 0.37 IU/mL (ref 0.30–0.70)

## 2016-07-01 LAB — PROTIME-INR
INR: 1.23
PROTHROMBIN TIME: 15.6 s — AB (ref 11.4–15.2)

## 2016-07-01 MED ORDER — WARFARIN SODIUM 5 MG PO TABS
5.0000 mg | ORAL_TABLET | Freq: Once | ORAL | Status: AC
  Administered 2016-07-01: 5 mg via ORAL
  Filled 2016-07-01: qty 1

## 2016-07-01 NOTE — Progress Notes (Signed)
[facility] ROUNDING NOTE   Subjective:   Interval History:  Aaron Long a 76 y.o.malewith medical history significant of ESRD dialysis MWF, CHF, CAD, DM, HTN. Patient presents to the ED with c/o sudden onset SOB that occurred while watching TV just PTA. Had NSTEMI back in April with similar symptoms. EMS called and symptoms completely resolved with O2 via [place] and NTG paste. Initally improved in hospital but had hypoglycemic episode after lunch-- responded to an amp of D50 but was found to be in a fib. No history of a fib in chart. Now on heparin drip   Objective:  Vital signs in last 24 hours:  Temp:  [97.2 F (36.2 C)-99.3 F (37.4 C)] 98.5 F (36.9 C) (06/21 0552) Pulse Rate:  [67-80] 79 (06/21 0552) Resp:  [12-18] 16 (06/21 0552) BP: (69-155)/(29-69) 155/57 (06/21 0552) SpO2:  [96 %-98 %] 96 % (06/21 0552) Weight:  [160 lb 11.5 oz (72.9 kg)-169 lb 5 oz (76.8 kg)] 160 lb 11.5 oz (72.9 kg) (06/21 0552)  Weight change: 4 lb 8.2 oz (2.047 kg) Filed Weights   06/30/16 1451 06/30/16 1927 07/01/16 0552  Weight: 169 lb 5 oz (76.8 kg) 161 lb 9.6 oz (73.3 kg) 160 lb 11.5 oz (72.9 kg)    Intake/Output: I/O last 3 completed shifts: In: 1003.7 [P.O.:780; I.V.:223.7] Out: 4001 [Urine:500; Other:3501]   Intake/Output this shift:  No intake/output data recorded.  CVS- RRR RS- CTA ABD- BS present soft non-distended EXT- no edema   Basic Metabolic Panel:  Recent Labs Lab 06/28/16 0146 06/30/16 1513  NA 131* 131*  K 4.4 4.0  CL 94* 95*  CO2 25 24  GLUCOSE 366* 235*  BUN 44* 45*  CREATININE 6.93* 6.69*  CALCIUM 10.1 9.1  PHOS  --  4.7*    Liver Function Tests:  Recent Labs Lab 06/30/16 1513  ALBUMIN 2.9*   No results for input(s): LIPASE, AMYLASE in the last 168 hours. No results for input(s): AMMONIA in the last 168 hours.  CBC:  Recent Labs Lab 06/28/16 0146 06/30/16 1513 07/01/16 0246  WBC 10.1 5.8 5.6  NEUTROABS 7.4  --   --    HGB 13.3 10.1* 11.2*  HCT 41.0 31.8* 36.3*  MCV 94.7 94.4 96.8  PLT 201 206 229    Cardiac Enzymes:  Recent Labs Lab 06/28/16 0146 06/28/16 0511 06/28/16 1216 06/28/16 1828 06/29/16 1501  TROPONINI 0.14* 0.20* 0.26* 0.32* 0.16*    BNP: Invalid input(s): POCBNP  CBG:  Recent Labs Lab 06/29/16 2131 06/30/16 0739 06/30/16 1124 06/30/16 2116 07/01/16 0737  GLUCAP 389* 206* 248* 133* 111*    Microbiology: Results for orders placed or performed during the hospital encounter of 06/28/16  MRSA PCR Screening     Status: None   Collection Time: 06/28/16 11:49 PM  Result Value Ref Range Status   MRSA by PCR NEGATIVE NEGATIVE Final    Comment:        The GeneXpert MRSA Assay (FDA approved for NASAL specimens only), is one component of a comprehensive MRSA colonization surveillance program. It is not intended to diagnose MRSA infection nor to guide or monitor treatment for MRSA infections.     Coagulation Studies:  Recent Labs  06/29/16 1800 06/30/16 0150 07/01/16 0246  LABPROT 13.0 13.3 15.6*  INR 0.98 1.01 1.23    Urinalysis: No results for input(s): COLORURINE, LABSPEC, PHURINE, GLUCOSEU, HGBUR, BILIRUBINUR, KETONESUR, PROTEINUR, UROBILINOGEN, NITRITE, LEUKOCYTESUR in the last 72 hours.  Invalid input(s): APPERANCEUR  Imaging: No results found.   Medications:   . sodium chloride    . sodium chloride    . heparin 1,000 Units/hr (06/30/16 1838)   . aspirin EC  81 mg Oral q morning - 10a  . carvedilol  6.25 mg Oral BID WC  . ferric citrate  420 mg Oral TID WC  . hydrALAZINE  25 mg Oral Q8H  . insulin aspart  0-9 Units Subcutaneous TID WC  . insulin aspart  3 Units Subcutaneous TID WC  . insulin detemir  13 Units Subcutaneous BID  . isosorbide mononitrate  30 mg Oral Daily  . polyethylene glycol  17 g Oral Daily  . pravastatin  40 mg Oral q1800  . pregabalin  75 mg Oral Daily  . QUEtiapine  25 mg Oral Daily  . tamsulosin  0.4 mg Oral  Daily  . Warfarin - Pharmacist Dosing Inpatient   Does not apply q1800   sodium chloride, sodium chloride, alteplase, heparin, HYDROcodone-acetaminophen, lidocaine (PF), lidocaine-prilocaine, pentafluoroprop-tetrafluoroeth, senna  Assessment/ Plan:  1. Volume overload and atrial fibrillation on heparin drip  2. ESRD - NL MWF HD schedule - hd today [place]  3. Hypertension/volume - Challenge dry weight Coreg and amlodipine as antihypertensives  4. Anemia - HGB 11.2    no ESA 5. Metabolic bone disease - hypercalcemia no vitamin D or calcium based binders  6. CAD - on Plavix  7. DM type 2 - per admit  8. HO bipolar -on Seroquel     LOS: 1 Marvene Strohm W @TODAY @7 :53 AM

## 2016-07-01 NOTE — Progress Notes (Addendum)
PROGRESS NOTE    Aaron Long  OEV:[id number] DOB: 12/04/1940 DOA: 06/28/2016 PCP: Patient, No Pcp Per   Outpatient Specialists:    Brief Narrative:  Aaron Long is a 76 y.o. male with medical history significant of ESRD dialysis MWF, CHF, CAD, DM, HTN.  Patient presents to the ED with c/o sudden onset SOB that occurred while watching TV just PTA.  Had NSTEMI back in April with similar symptoms.  EMS called and symptoms completely resolved with O2 via [place] and NTG paste.  Initally improved in hospital but had hypoglycemic episode after lunch-- responded to an amp of D50 but was found to be in a fib.  No history of a fib in chart.  Transitioning to coumadin with heparin gtt.   Assessment & Plan:   Principal Problem:   Acute on chronic systolic and diastolic heart failure, NYHA class 4 (HCC) Active Problems:   HTN (hypertension)   CAD (coronary artery disease)   DM type 2, uncontrolled, with renal complications (HCC)   ESRD on dialysis [facility])   Pulmonary edema   Atrial fibrillation ([place])   New onset atrial fibrillation ([place])   New onset a fib- paroysmal -[place] VASC 2 of 6 -h/o GI bleed due to [facility] lesions in 2017- discussed with on-call GI and ok to start anticoagulation -cardiology consult appreciated  -plan to continue coumadin (goal 2-3) with heparin bridge- will need close monitoring for bleeding and daily INRs -plavix d/c'd  Acute on chronic CHF -improved with dialysis  ESRD -dialysis M/W/F  Hypoglycemia episode with uncontrolled DM -suspect patient does not follow diabetic diet at home -responded to 1 amp d50 -adjust medication and diet    DVT prophylaxis:  Heparin gtt   Full Code   Family Communication: Wife 6/20 phone  Disposition Plan:  Home once inr 2-3   Consultants:   Cards  renal    Subjective: Eating well, no SOb, no CP   Objective: Vitals:   06/30/16 1830 06/30/16 1900 06/30/16 1927 07/01/16 0552  BP: (!) 74/36 (!)  103/58 138/69 (!) 155/57  Pulse: 68 67 70 79  Resp: 17 15 13 16   Temp:   97.2 F (36.2 C) 98.5 F (36.9 C)  TempSrc:   Oral Oral  SpO2: 98% 98% 98% 96%  Weight:   73.3 kg (161 lb 9.6 oz) 72.9 kg (160 lb 11.5 oz)  Height:        Intake/Output Summary (Last 24 hours) at 07/01/16 1117 Last data filed at 07/01/16 1030  Gross per 24 hour  Intake              810 ml  Output             4001 ml  Net            -3191 ml   Filed Weights   06/30/16 1451 06/30/16 1927 07/01/16 0552  Weight: 76.8 kg (169 lb 5 oz) 73.3 kg (161 lb 9.6 oz) 72.9 kg (160 lb 11.5 oz)    Examination:  General exam: NAD, in bed Respiratory system: no wheezing, diminished Cardiovascular system: rrr- no episode of a fib on tele Gastrointestinal system: +BS, soft Skin: no rashes B/l amputee  Data Reviewed: I have personally reviewed following labs and imaging studies  CBC:  Recent Labs Lab 06/28/16 0146 06/30/16 1513 07/01/16 0246  WBC 10.1 5.8 5.6  NEUTROABS 7.4  --   --   HGB 13.3 10.1* 11.2*  HCT 41.0 31.8* 36.3*  MCV  94.7 94.4 96.8  PLT 201 206 443   Basic Metabolic Panel:  Recent Labs Lab 06/28/16 0146 06/30/16 1513  NA 131* 131*  K 4.4 4.0  CL 94* 95*  CO2 25 24  GLUCOSE 366* 235*  BUN 44* 45*  CREATININE 6.93* 6.69*  CALCIUM 10.1 9.1  PHOS  --  4.7*   GFR: Estimated Creatinine Clearance: 9.1 mL/min (A) (by C-G formula based on SCr of 6.69 mg/dL (H)). Liver Function Tests:  Recent Labs Lab 06/30/16 1513  ALBUMIN 2.9*   No results for input(s): LIPASE, AMYLASE in the last 168 hours. No results for input(s): AMMONIA in the last 168 hours. Coagulation Profile:  Recent Labs Lab 06/29/16 1800 06/30/16 0150 07/01/16 0246  INR 0.98 1.01 1.23   Cardiac Enzymes:  Recent Labs Lab 06/28/16 0146 06/28/16 0511 06/28/16 1216 06/28/16 1828 06/29/16 1501  TROPONINI 0.14* 0.20* 0.26* 0.32* 0.16*   BNP (last 3 results) No results for input(s): PROBNP in the last 8760  hours. HbA1C: No results for input(s): HGBA1C in the last 72 hours. CBG:  Recent Labs Lab 06/29/16 2131 06/30/16 0739 06/30/16 1124 06/30/16 2116 07/01/16 0737  GLUCAP 389* 206* 248* 133* 111*   Lipid Profile: No results for input(s): CHOL, HDL, LDLCALC, TRIG, CHOLHDL, LDLDIRECT in the last 72 hours. Thyroid Function Tests: No results for input(s): TSH, T4TOTAL, FREET4, T3FREE, THYROIDAB in the last 72 hours. Anemia Panel: No results for input(s): VITAMINB12, FOLATE, FERRITIN, TIBC, IRON, RETICCTPCT in the last 72 hours. Urine analysis:    Component Value Date/Time   COLORURINE YELLOW 10/15/2014 1344   APPEARANCEUR CLEAR 10/15/2014 1344   APPEARANCEUR Turbid (A) 07/12/2014 1644   LABSPEC 1.017 10/15/2014 1344   PHURINE 6.0 10/15/2014 1344   GLUCOSEU >1000 (A) 10/15/2014 1344   HGBUR SMALL (A) 10/15/2014 1344   BILIRUBINUR NEGATIVE 10/15/2014 1344   BILIRUBINUR Negative 07/12/2014 [place] 10/15/2014 1344   PROTEINUR >300 (A) 10/15/2014 1344   UROBILINOGEN 0.2 10/15/2014 1344   NITRITE NEGATIVE 10/15/2014 1344   LEUKOCYTESUR NEGATIVE 10/15/2014 1344   LEUKOCYTESUR 3+ (A) 07/12/2014 1644    ) Recent Results (from the past 240 hour(s))  MRSA PCR Screening     Status: None   Collection Time: 06/28/16 11:49 PM  Result Value Ref Range Status   MRSA by PCR NEGATIVE NEGATIVE Final    Comment:        The GeneXpert MRSA Assay (FDA approved for NASAL specimens only), is one component of a comprehensive MRSA colonization surveillance program. It is not intended to diagnose MRSA infection nor to guide or monitor treatment for MRSA infections.       Anti-infectives    None       Radiology Studies: No results found.      Scheduled Meds: . aspirin EC  81 mg Oral q morning - 10a  . carvedilol  6.25 mg Oral BID WC  . ferric citrate  420 mg Oral TID WC  . hydrALAZINE  25 mg Oral Q8H  . insulin aspart  0-9 Units Subcutaneous TID WC  .  insulin aspart  3 Units Subcutaneous TID WC  . insulin detemir  13 Units Subcutaneous BID  . isosorbide mononitrate  30 mg Oral Daily  . polyethylene glycol  17 g Oral Daily  . pravastatin  40 mg Oral q1800  . pregabalin  75 mg Oral Daily  . QUEtiapine  25 mg Oral Daily  . tamsulosin  0.4 mg Oral Daily  .  Warfarin - Pharmacist Dosing Inpatient   Does not apply q1800   Continuous Infusions: . sodium chloride    . sodium chloride    . heparin 1,000 Units/hr (06/30/16 1838)     LOS: 1 day    Time spent: 79 min    [place], DO [facility] Pager [phone]  If 7PM-7AM, please contact night-coverage [email or web address] Password [facility] 07/01/2016, 11:17 AM

## 2016-07-01 NOTE — Progress Notes (Signed)
ANTICOAGULATION CONSULT NOTE - Follow Up Consult  Pharmacy Consult for Heparin and Coumadin Indication: atrial fibrillation  Allergies  Allergen Reactions  . Oxycodone Other (See Comments)    Hallucinations    Patient Measurements: Height: 5\' 8"  (172.7 cm) Weight: 160 lb 11.5 oz (72.9 kg) IBW/kg (Calculated) : 68.4 Heparin Dosing Weight: 73 kg  Vital Signs: Temp: 98.5 F (36.9 C) (06/21 0552) Temp Source: Oral (06/21 0552) BP: 155/57 (06/21 0552) Pulse Rate: 79 (06/21 0552)  Labs:  Recent Labs  06/28/16 1216 06/28/16 1828 06/29/16 1501 06/29/16 1800  06/30/16 0150 06/30/16 1027 06/30/16 1513 07/01/16 0246 07/01/16 0714  HGB  --   --   --   --   --   --   --  10.1* 11.2*  --   HCT  --   --   --   --   --   --   --  31.8* 36.3*  --   PLT  --   --   --   --   --   --   --  206 229  --   LABPROT  --   --   --  13.0  --  13.3  --   --  15.6*  --   INR  --   --   --  0.98  --  1.01  --   --  1.23  --   HEPARINUNFRC  --   --   --   --   < > 0.58 0.36  --  >2.20* 0.37  CREATININE  --   --   --   --   --   --   --  6.69*  --   --   TROPONINI 0.26* 0.32* 0.16*  --   --   --   --   --   --   --   < > = values in this interval not displayed.  Estimated Creatinine Clearance: 9.1 mL/min (A) (by C-G formula based on SCr of 6.69 mg/dL (H)).  Assessment:   Heparin and Coumadin begin on 6/19 for new onset atrial fibrillation.   Initial heparin level remains therapeutic (0.37) on  1000 units/hr. Level >2.20 earlier this am drawn from brachial site, IV in forearm. Redrawn from hand.    INR 1.23 after Coumadin 5 mg daily x 2.  Dosing conservatively with hx bleeding during HD session noted, as well as hx GI bleed in 2017.  Patient reports no recent bleeding with HD.    Plavix stopped 6/20, to continue ASA 81 mg.    Goal of Therapy:  INR 2-3 Heparin level 0.3-0.7 units/ml Monitor platelets by anticoagulation protocol: Yes   Plan:   Continue heparin drip at 1000 units/hr.  Coumadin 5 mg again tonight. May consider increasing dose on 6/22 if INR not rising.  Daily heparin level, CBC and PT/INR.  Monitor for any bleeding.  Arty Baumgartner, [place] Pager: [phone] 07/01/2016,11:42 AM

## 2016-07-01 NOTE — Progress Notes (Addendum)
Progress Note  Patient Name: Aaron Long Date of Encounter: 07/01/2016  Primary Cardiologist: Angelena Form  Subjective   Feeling well this morning. No further Afib.  Inpatient Medications    Scheduled Meds: . aspirin EC  81 mg Oral q morning - 10a  . carvedilol  6.25 mg Oral BID WC  . ferric citrate  420 mg Oral TID WC  . hydrALAZINE  25 mg Oral Q8H  . insulin aspart  0-9 Units Subcutaneous TID WC  . insulin aspart  3 Units Subcutaneous TID WC  . insulin detemir  13 Units Subcutaneous BID  . isosorbide mononitrate  30 mg Oral Daily  . polyethylene glycol  17 g Oral Daily  . pravastatin  40 mg Oral q1800  . pregabalin  75 mg Oral Daily  . QUEtiapine  25 mg Oral Daily  . tamsulosin  0.4 mg Oral Daily  . Warfarin - Pharmacist Dosing Inpatient   Does not apply q1800   Continuous Infusions: . sodium chloride    . sodium chloride    . heparin 1,000 Units/hr (06/30/16 1838)   PRN Meds: sodium chloride, sodium chloride, alteplase, heparin, HYDROcodone-acetaminophen, lidocaine (PF), lidocaine-prilocaine, pentafluoroprop-tetrafluoroeth, senna   Vital Signs    Vitals:   06/30/16 1830 06/30/16 1900 06/30/16 1927 07/01/16 0552  BP: (!) 74/36 (!) 103/58 138/69 (!) 155/57  Pulse: 68 67 70 79  Resp: 17 15 13 16   Temp:   97.2 F (36.2 C) 98.5 F (36.9 C)  TempSrc:   Oral Oral  SpO2: 98% 98% 98% 96%  Weight:   161 lb 9.6 oz (73.3 kg) 160 lb 11.5 oz (72.9 kg)  Height:        Intake/Output Summary (Last 24 hours) at 07/01/16 1125 Last data filed at 07/01/16 1030  Gross per 24 hour  Intake              810 ml  Output             4001 ml  Net            -3191 ml   Filed Weights   06/30/16 1451 06/30/16 1927 07/01/16 0552  Weight: 169 lb 5 oz (76.8 kg) 161 lb 9.6 oz (73.3 kg) 160 lb 11.5 oz (72.9 kg)    Telemetry    SR - Personally Reviewed  ECG    N/a - Personally Reviewed  Physical Exam   General: Chronically ill older AA male appearing in no acute  distress. Head: Normocephalic, atraumatic.  Neck: Supple without bruits, JVD. Lungs:  Resp regular and unlabored, CTA. Heart: RRR, S1, S2, no S3, S4, or murmur; no rub. Abdomen: Soft, non-tender, non-distended with normoactive bowel sounds. No hepatomegaly. No rebound/guarding. No obvious abdominal masses. Extremities: No clubbing, cyanosis, edema. Bilateral BKA. AV fistula in LUE; stumps covered Neuro: Alert and oriented X 3. Moves all extremities spontaneously. Psych: Normal affect.  Labs    Chemistry Recent Labs Lab 06/28/16 0146 06/30/16 1513  NA 131* 131*  K 4.4 4.0  CL 94* 95*  CO2 25 24  GLUCOSE 366* 235*  BUN 44* 45*  CREATININE 6.93* 6.69*  CALCIUM 10.1 9.1  ALBUMIN  --  2.9*  GFRNONAA 7* 7*  GFRAA 8* 8*  ANIONGAP 12 12     Hematology Recent Labs Lab 06/28/16 0146 06/30/16 1513 07/01/16 0246  WBC 10.1 5.8 5.6  RBC 4.33 3.37* 3.75*  HGB 13.3 10.1* 11.2*  HCT 41.0 31.8* 36.3*  MCV 94.7 94.4 96.8  MCH 30.7 30.0 29.9  MCHC 32.4 31.8 30.9  RDW 14.7 15.0 15.5  PLT 201 206 229    Cardiac Enzymes Recent Labs Lab 06/28/16 0511 06/28/16 1216 06/28/16 1828 06/29/16 1501  TROPONINI 0.20* 0.26* 0.32* 0.16*   No results for input(s): TROPIPOC in the last 168 hours.   BNP Recent Labs Lab 06/28/16 0146  BNP >4,500.0*     DDimer No results for input(s): DDIMER in the last 168 hours.    Radiology    No results found.  Cardiac Studies   TTE: 04/11/16  Study Conclusions  - Left ventricle: Septal and posterior lateral wall hypokinesis The   cavity size was mildly dilated. There was mild focal basal   hypertrophy of the septum. Systolic function was moderately   reduced. The estimated ejection fraction was in the range of 35%   to 40%. Doppler parameters are consistent with abnormal left   ventricular relaxation (grade 1 diastolic dysfunction). - Aortic valve: Likely trileaflet but not well seen - Atrial septum: No defect or patent foramen ovale  was identified.  LHC: 04/12/16  Conclusion   Conclusions: 1. Severe native coronary artery disease, including chronic total occlusions of the proximal LAD, mid LAD, ostial LCx, and mid RCA. 2. All bypass grafts widely patent (LIMA->LAD, SVG->diagonal, SVG->OM, and SVG->PDA). There is 80% stenosis involving the grafted OM proximal to SVG anastomosis, which backfills the distal LCx. 3. Moderately elevated LVEDP (30 mmHg). 4. Severely reduced LV contraction (LVEF ~25%).  Conclusions: 1. Aggressive medical therapy, including additional fluid removal by hemodialysis. 2. Consider addition of beta blocker and/or long-acting nitrate (in addition to other evidence-based heart failure therapies).     Patient Profile     76 y.o. male a hx of MI 2005, CABG 2007, patent grafts at 04/2016, CHF with EF 35-40% 04/2016, ESRD with HD on MWF, type 2 diabetes, PAD s/p bilateral BKA and bilateral carotid endarterectomywho is being seen for the evaluation of new onset atrial fibrillation.  Assessment & Plan    1. PAF: No further episodes of Afib on telemetry, maintaining SR.  -- CHADSVASC score of 6. On Coumadin with heparin bridge per pharmacy. No bleeding complication. Hgb stable. Plavix has been stopped due to Hx of bleeding complications. INR 1.23 today (goal 2-3) -- Continue Coreg to 6.25 mg BID If recurrent symptoms--> may consider further up titration of beta blocker versus addition of antiarrhythmic. -- Echo last admission as above.  2. Chronic systolic heart failure: -- EF of 35-40% by echo 04/2016. Chest x-ray on 6/18 showed pulmonary edema and probable small pleural effusions, suspicious for CHF. -- Volume managed by hemodialysis. Weight trending down. Net - 5.4L  3. CAD s/p CABG: -- Last cath 04/2016 as above--> plan for medical therapy. No angina. Continue ASA. Plavix stopped due to need of anticoagulation.  -- Continue Imdur and beta blocker.  4. HTN: -- Stable with current therapy.    5. ESRD on HD: Had dialysis yesterday and tolerated well.   6. DM: SSI  Signed, Reino Bellis, NP  07/01/2016, 11:25 AM    I have examined the patient and reviewed assessment and plan and discussed with patient. RRR, s1S2. Agree with above as stated.  Dialysis for fluid management.  Agree with COumadin.  No bleeding issues.  I updated the wife who is at home recovering from eye surgery.  Larae Grooms

## 2016-07-02 LAB — CBC
HCT: 34.1 % — ABNORMAL LOW (ref 39.0–52.0)
Hemoglobin: 10.7 g/dL — ABNORMAL LOW (ref 13.0–17.0)
MCH: 30 pg (ref 26.0–34.0)
MCHC: 31.4 g/dL (ref 30.0–36.0)
MCV: 95.5 fL (ref 78.0–100.0)
PLATELETS: 229 10*3/uL (ref 150–400)
RBC: 3.57 MIL/uL — AB (ref 4.22–5.81)
RDW: 15.2 % (ref 11.5–15.5)
WBC: 6.7 10*3/uL (ref 4.0–10.5)

## 2016-07-02 LAB — GLUCOSE, CAPILLARY
Glucose-Capillary: 183 mg/dL — ABNORMAL HIGH (ref 65–99)
Glucose-Capillary: 151 mg/dL — ABNORMAL HIGH (ref 65–99)
Glucose-Capillary: 181 mg/dL — ABNORMAL HIGH (ref 65–99)
Glucose-Capillary: 152 mg/dL — ABNORMAL HIGH (ref 65–99)

## 2016-07-02 LAB — RENAL FUNCTION PANEL
ALBUMIN: 3.2 g/dL — AB (ref 3.5–5.0)
ANION GAP: 11 (ref 5–15)
BUN: 37 mg/dL — AB (ref 6–20)
CALCIUM: 9.2 mg/dL (ref 8.9–10.3)
CO2: 26 mmol/L (ref 22–32)
CREATININE: 6.15 mg/dL — AB (ref 0.61–1.24)
Chloride: 94 mmol/L — ABNORMAL LOW (ref 101–111)
GFR calc Af Amer: 9 mL/min — ABNORMAL LOW (ref >60)
GFR calc non Af Amer: 8 mL/min — ABNORMAL LOW (ref >60)
GLUCOSE: 180 mg/dL — AB (ref 65–99)
PHOSPHORUS: 4.1 mg/dL (ref 2.5–4.6)
Potassium: 4.7 mmol/L (ref 3.5–5.1)
SODIUM: 131 mmol/L — AB (ref 135–145)

## 2016-07-02 LAB — PROTIME-INR
INR: 1.06
PROTHROMBIN TIME: 13.8 s (ref 11.4–15.2)

## 2016-07-02 LAB — HEPARIN LEVEL (UNFRACTIONATED): Heparin Unfractionated: 0.31 IU/mL (ref 0.30–0.70)

## 2016-07-02 MED ORDER — ALTEPLASE 2 MG IJ SOLR: 2.0000 mg | Freq: Once | INTRAMUSCULAR | Status: DC | PRN

## 2016-07-02 MED ORDER — SODIUM CHLORIDE 0.9 % IV SOLN
100.0000 mL | INTRAVENOUS | Status: DC | PRN
Start: 2016-07-02 — End: 2016-07-02

## 2016-07-02 MED ORDER — LIDOCAINE-PRILOCAINE 2.5-2.5 % EX CREA: 1.0000 "application " | TOPICAL_CREAM | CUTANEOUS | Status: DC | PRN

## 2016-07-02 MED ORDER — LIDOCAINE HCL (PF) 1 % IJ SOLN
5.0000 mL | INTRAMUSCULAR | Status: DC | PRN
Start: 2016-07-02 — End: 2016-07-02

## 2016-07-02 MED ORDER — DARBEPOETIN ALFA 60 MCG/0.3ML IJ SOSY
60.0000 ug | PREFILLED_SYRINGE | INTRAMUSCULAR | Status: DC
  Administered 2016-07-05: 60 ug via INTRAVENOUS
  Filled 2016-07-02: qty 0.3

## 2016-07-02 MED ORDER — DOXERCALCIFEROL 4 MCG/2ML IV SOLN
INTRAVENOUS | Status: AC
  Filled 2016-07-02: qty 4

## 2016-07-02 MED ORDER — HEPARIN SODIUM (PORCINE) 1000 UNIT/ML DIALYSIS: 1000.0000 [IU] | INTRAMUSCULAR | Status: DC | PRN

## 2016-07-02 MED ORDER — PENTAFLUOROPROP-TETRAFLUOROETH EX AERO: 1.0000 "application " | INHALATION_SPRAY | CUTANEOUS | Status: DC | PRN

## 2016-07-02 MED ORDER — SODIUM CHLORIDE 0.9 % IV SOLN: 100.0000 mL | INTRAVENOUS | Status: DC | PRN

## 2016-07-02 MED ORDER — DOXERCALCIFEROL 4 MCG/2ML IV SOLN
5.0000 ug | INTRAVENOUS | Status: DC
  Administered 2016-07-02 – 2016-07-05 (×2): 5 ug via INTRAVENOUS
  Filled 2016-07-02: qty 4

## 2016-07-02 MED ORDER — WARFARIN SODIUM 7.5 MG PO TABS
7.5000 mg | ORAL_TABLET | Freq: Once | ORAL | Status: AC
  Administered 2016-07-02: 7.5 mg via ORAL
  Filled 2016-07-02: qty 1

## 2016-07-02 NOTE — Progress Notes (Signed)
ANTICOAGULATION CONSULT NOTE - Follow Up Consult  Pharmacy Consult for Heparin and Coumadin Indication: atrial fibrillation  Allergies  Allergen Reactions  . Oxycodone Other (See Comments)    Hallucinations    Patient Measurements: Height: 5\' 8"  (172.7 cm) Weight: 161 lb 9.6 oz (73.3 kg) IBW/kg (Calculated) : 68.4 Heparin Dosing Weight: 73 kg  Vital Signs: Temp: 98 F (36.7 C) (06/22 1254) Temp Source: Oral (06/22 1254) BP: 155/61 (06/22 1254) Pulse Rate: 74 (06/22 1254)  Labs:  Recent Labs  06/30/16 0150  06/30/16 1513 07/01/16 0246 07/01/16 0714 07/02/16 0431 07/02/16 0727  HGB  --   < > 10.1* 11.2*  --  10.7*  --   HCT  --   --  31.8* 36.3*  --  34.1*  --   PLT  --   --  206 229  --  229  --   LABPROT 13.3  --   --  15.6*  --  13.8  --   INR 1.01  --   --  1.23  --  1.06  --   HEPARINUNFRC 0.58  < >  --  >2.20* 0.37 0.31  --   CREATININE  --   --  6.69*  --   --   --  6.15*  < > = values in this interval not displayed.  Estimated Creatinine Clearance: 9.9 mL/min (A) (by C-G formula based on SCr of 6.15 mg/dL (H)).  Assessment:   Heparin and Coumadin begin on 6/19 for new onset atrial fibrillation.   Initial heparin level remains therapeutic (0.31) on  1000 units/hr.   INR 1.23 yesterday, but down to 1.06 today. Has had Coumadin 5 mg daily x 3.  Dosing conservatively with hx bleeding during HD session noted, as well as hx GI bleed in 2017.  Patient reports bleeding when coming off HD today, stopped after pressure held for ~15 minutes.    Plavix stopped 6/20, to continue ASA 81 mg.    Goal of Therapy:  INR 2-3 Heparin level 0.3-0.7 units/ml Monitor platelets by anticoagulation protocol: Yes   Plan:   Continue heparin drip at 1000 units/hr.  Increase Coumadin to 7.5 mg x 1 today.  Daily heparin level, CBC and PT/INR.  Aim for lower end of therapeutic ranges for heparin and Coumadin dosing.  Monitor for any bleeding.  Arty Baumgartner, [place] Pager:  [phone] 07/02/2016,3:38 PM

## 2016-07-02 NOTE — Progress Notes (Signed)
PROGRESS NOTE    Aaron Long  MWN:[id number] DOB: 04/22/1940 DOA: 06/28/2016 PCP: Patient, No Pcp Per   Brief Narrative: 76 year old male admitted for SOB at rest, which resolved with oxygen. He was also found to be in new onset a fib with RVR and hypoglycemia. He was started on IV heparin after consulting GI, currently on coumadin , awaiting therapeutic INR.   Assessment & Plan:   Principal Problem:   Acute on chronic systolic and diastolic heart failure, NYHA class 4 (HCC) Active Problems:   HTN (hypertension)   CAD (coronary artery disease)   DM type 2, uncontrolled, with renal complications (HCC)   ESRD on dialysis [facility])   Pulmonary edema   Atrial fibrillation ([place])   New onset atrial fibrillation (HCC)   Acute on chronic systolic heart failure:  Fluid management as per HD.  Currently has diuresed about 6.9 lit since admission.  Daily weights,  Strict intake and output.     ESRD on HD:  Further management as per renal.    Type 2 DM: CBG (last 3)   Recent Labs  07/01/16 2125 07/02/16 0735 07/02/16 1307  GLUCAP 96 183* 151*    Resume SSI.  Will d/c the pre meal novolog.    Hypertension: controlled.    New onset atrial fib : rate better controlled and in sinus.  Started on IV heparin and coumadin added on.  His [place] vasc is 6.   H/o CAD s/p CABG:  NO chest pain or sob. Today.  Resume home meds.     DVT prophylaxis: heparin , coumadin.  Code Status: full code.  Family Communication:  None at bedside.  Disposition Plan: pending therapeutic INR.    Consultants:   Cardiology  Nephrology.    Procedures: HD   Antimicrobials: NONE.    Subjective: No chest pain. Breathing has improved but not back to baseline. .  No nausea, or vomiting.  Objective: Vitals:   07/02/16 1130 07/02/16 1200 07/02/16 1210 07/02/16 1254  BP: (P) 128/60 (!) (P) 133/58 136/76 (!) 155/61  Pulse: 66 67 65 74  Resp: (P) 18 (P) 19 18 20   Temp:   98.2 F (36.8  C) 98 F (36.7 C)  TempSrc:   Oral Oral  SpO2:   96% 98%  Weight:   73.3 kg (161 lb 9.6 oz)   Height:        Intake/Output Summary (Last 24 hours) at 07/02/16 1428 Last data filed at 07/02/16 1354  Gross per 24 hour  Intake            707.5 ml  Output             2200 ml  Net          -1492.5 ml   Filed Weights   07/02/16 0509 07/02/16 0750 07/02/16 1210  Weight: 75 kg (165 lb 4.8 oz) 75.3 kg (166 lb 0.1 oz) 73.3 kg (161 lb 9.6 oz)    Examination:  General exam: Appears calm and comfortable  Respiratory system: clear, no wheezing or rhonchi.  Cardiovascular system: S1 & S2 heard, regular, no JVD.  Gastrointestinal system: Abdomen is nondistended, soft and nontender. No organomegaly or masses felt. Normal bowel sounds heard. Central nervous system: Alert and oriented. No focal neurological deficits. Extremities: bilateral BKA, LUE  Fistula.  Skin: No rashes, lesions or ulcers Psychiatry: mood appropriate.     Data Reviewed: I have personally reviewed following labs and imaging studies  CBC:  Recent Labs Lab 06/28/16 0146 06/30/16 1513 07/01/16 0246 07/02/16 0431  WBC 10.1 5.8 5.6 6.7  NEUTROABS 7.4  --   --   --   HGB 13.3 10.1* 11.2* 10.7*  HCT 41.0 31.8* 36.3* 34.1*  MCV 94.7 94.4 96.8 95.5  PLT 201 206 229 983   Basic Metabolic Panel:  Recent Labs Lab 06/28/16 0146 06/30/16 1513 07/02/16 0727  NA 131* 131* 131*  K 4.4 4.0 4.7  CL 94* 95* 94*  CO2 25 24 26   GLUCOSE 366* 235* 180*  BUN 44* 45* 37*  CREATININE 6.93* 6.69* 6.15*  CALCIUM 10.1 9.1 9.2  PHOS  --  4.7* 4.1   GFR: Estimated Creatinine Clearance: 9.9 mL/min (A) (by C-G formula based on SCr of 6.15 mg/dL (H)). Liver Function Tests:  Recent Labs Lab 06/30/16 1513 07/02/16 0727  ALBUMIN 2.9* 3.2*   No results for input(s): LIPASE, AMYLASE in the last 168 hours. No results for input(s): AMMONIA in the last 168 hours. Coagulation Profile:  Recent Labs Lab 06/29/16 1800  06/30/16 0150 07/01/16 0246 07/02/16 0431  INR 0.98 1.01 1.23 1.06   Cardiac Enzymes:  Recent Labs Lab 06/28/16 0146 06/28/16 0511 06/28/16 1216 06/28/16 1828 06/29/16 1501  TROPONINI 0.14* 0.20* 0.26* 0.32* 0.16*   BNP (last 3 results) No results for input(s): PROBNP in the last 8760 hours. HbA1C: No results for input(s): HGBA1C in the last 72 hours. CBG:  Recent Labs Lab 07/01/16 1123 07/01/16 1611 07/01/16 2125 07/02/16 0735 07/02/16 1307  GLUCAP 126* 169* 96 183* 151*   Lipid Profile: No results for input(s): CHOL, HDL, LDLCALC, TRIG, CHOLHDL, LDLDIRECT in the last 72 hours. Thyroid Function Tests: No results for input(s): TSH, T4TOTAL, FREET4, T3FREE, THYROIDAB in the last 72 hours. Anemia Panel: No results for input(s): VITAMINB12, FOLATE, FERRITIN, TIBC, IRON, RETICCTPCT in the last 72 hours. Sepsis Labs: No results for input(s): PROCALCITON, LATICACIDVEN in the last 168 hours.  Recent Results (from the past 240 hour(s))  MRSA PCR Screening     Status: None   Collection Time: 06/28/16 11:49 PM  Result Value Ref Range Status   MRSA by PCR NEGATIVE NEGATIVE Final    Comment:        The GeneXpert MRSA Assay (FDA approved for NASAL specimens only), is one component of a comprehensive MRSA colonization surveillance program. It is not intended to diagnose MRSA infection nor to guide or monitor treatment for MRSA infections.          Radiology Studies: No results found.      Scheduled Meds: . aspirin EC  81 mg Oral q morning - 10a  . carvedilol  6.25 mg Oral BID WC  . [START ON 07/05/2016] darbepoetin (ARANESP) injection - DIALYSIS  60 mcg Intravenous Q Mon-HD  . doxercalciferol  5 mcg Intravenous Q M,W,F-HD  . ferric citrate  420 mg Oral TID WC  . hydrALAZINE  25 mg Oral Q8H  . insulin aspart  0-9 Units Subcutaneous TID WC  . insulin aspart  3 Units Subcutaneous TID WC  . insulin detemir  13 Units Subcutaneous BID  . isosorbide  mononitrate  30 mg Oral Daily  . polyethylene glycol  17 g Oral Daily  . pravastatin  40 mg Oral q1800  . pregabalin  75 mg Oral Daily  . QUEtiapine  25 mg Oral Daily  . tamsulosin  0.4 mg Oral Daily  . Warfarin - Pharmacist Dosing Inpatient   Does not apply q1800   Continuous  Infusions: . heparin 1,000 Units/hr (07/01/16 1733)     LOS: 2 days    Time spent: 35 minutes.     [name], MD [facility] Pager [phone]  If 7PM-7AM, please contact night-coverage [email or web address] Password [facility] 07/02/2016, 2:28 PM

## 2016-07-02 NOTE — Progress Notes (Signed)
Subjective:  Seen on HD - awaiting INR at goal - is only 1.06 today- no particular complaints- no SOB Objective Vital signs in last 24 hours: Vitals:   07/02/16 0815 07/02/16 0900 07/02/16 0930 07/02/16 1000  BP: 133/68 (!) 101/53 (!) 96/53 (!) 122/58  Pulse: 72 70 68 68  Resp: 18 18 18 18   Temp:      TempSrc:      SpO2:      Weight:      Height:       Weight change: -1.82 kg (-4 lb 0.2 oz)  Intake/Output Summary (Last 24 hours) at 07/02/16 1026 Last data filed at 07/02/16 0645  Gross per 24 hour  Intake            567.5 ml  Output              500 ml  Net             67.5 ml   [place] MWF 4h 72.5kg Hep 2200 LUA AVF Hectoral  IV/HD Venofer 50 mg qwkly hd    Assessment/ Plan: Pt is a 76 y.o. yo male with ESRD who was admitted on 06/28/2016 with SOB- found to have volume overload- hosp c/p new onset a fib and hypoglycemia  Assessment/Plan: 1. SOB- due to volume vs afib- resolved.   2. ESRD - normally MWF at AF- via AVF- continue on schedule 3. Anemia- hgb 10.7- no ESA here- given 6/1 and 6/4- 50 both times- probably due for more and in the setting of anticoagulation will do  4. Secondary hyperparathyroidism- auryxia ordered here, phos is good - says was on hect as OP but not ordered here- will order  5. HTN/volume- BP soft- was on hydralazine 25 and coreg low dose as OP- continued  6. New onset a fib- new start to coumadin- awaiting INR over 2- not there yet  Kashayla Ungerer A    Labs: Basic Metabolic Panel:  Recent Labs Lab 06/28/16 0146 06/30/16 1513 07/02/16 0727  NA 131* 131* 131*  K 4.4 4.0 4.7  CL 94* 95* 94*  CO2 25 24 26   GLUCOSE 366* 235* 180*  BUN 44* 45* 37*  CREATININE 6.93* 6.69* 6.15*  CALCIUM 10.1 9.1 9.2  PHOS  --  4.7* 4.1   Liver Function Tests:  Recent Labs Lab 06/30/16 1513 07/02/16 0727  ALBUMIN 2.9* 3.2*   No results for input(s): LIPASE, AMYLASE in the last 168 hours. No results for input(s): AMMONIA in the last  168 hours. CBC:  Recent Labs Lab 06/28/16 0146 06/30/16 1513 07/01/16 0246 07/02/16 0431  WBC 10.1 5.8 5.6 6.7  NEUTROABS 7.4  --   --   --   HGB 13.3 10.1* 11.2* 10.7*  HCT 41.0 31.8* 36.3* 34.1*  MCV 94.7 94.4 96.8 95.5  PLT 201 206 229 229   Cardiac Enzymes:  Recent Labs Lab 06/28/16 0146 06/28/16 0511 06/28/16 1216 06/28/16 1828 06/29/16 1501  TROPONINI 0.14* 0.20* 0.26* 0.32* 0.16*   CBG:  Recent Labs Lab 07/01/16 0737 07/01/16 1123 07/01/16 1611 07/01/16 2125 07/02/16 0735  GLUCAP 111* 126* 169* 96 183*    Iron Studies: No results for input(s): IRON, TIBC, TRANSFERRIN, FERRITIN in the last 72 hours. Studies/Results: No results found. Medications: Infusions: . sodium chloride    . sodium chloride    . heparin 1,000 Units/hr (07/01/16 1733)    Scheduled Medications: . aspirin EC  81 mg Oral q morning - 10a  . carvedilol  6.25 mg Oral BID WC  . ferric citrate  420 mg Oral TID WC  . hydrALAZINE  25 mg Oral Q8H  . insulin aspart  0-9 Units Subcutaneous TID WC  . insulin aspart  3 Units Subcutaneous TID WC  . insulin detemir  13 Units Subcutaneous BID  . isosorbide mononitrate  30 mg Oral Daily  . polyethylene glycol  17 g Oral Daily  . pravastatin  40 mg Oral q1800  . pregabalin  75 mg Oral Daily  . QUEtiapine  25 mg Oral Daily  . tamsulosin  0.4 mg Oral Daily  . Warfarin - Pharmacist Dosing Inpatient   Does not apply q1800    have reviewed scheduled and prn medications.  Physical Exam: General:NAD Heart:RRR- seems NS Lungs:clear Abdomen: soft,non tender Extremities:bilat amps- no obvious edema Dialysis Access: left AVF     07/02/2016,10:26 AM  LOS: 2 days

## 2016-07-02 NOTE — Progress Notes (Signed)
Patient with no complaints or concerns during 7pm - 7am shift.  Kewon Statler, RN 

## 2016-07-02 NOTE — Procedures (Signed)
Patient was seen on dialysis and the procedure was supervised.  BFR 400  Via AVF BP is  115/58.   Patient appears to be tolerating treatment well  Aaron Long A 07/02/2016

## 2016-07-03 LAB — HEPARIN LEVEL (UNFRACTIONATED)
Heparin Unfractionated: 0.24 IU/mL — ABNORMAL LOW (ref 0.30–0.70)
Heparin Unfractionated: 0.31 IU/mL (ref 0.30–0.70)

## 2016-07-03 LAB — CBC
HEMATOCRIT: 34.7 % — AB (ref 39.0–52.0)
Hemoglobin: 10.7 g/dL — ABNORMAL LOW (ref 13.0–17.0)
MCH: 30 pg (ref 26.0–34.0)
MCHC: 30.8 g/dL (ref 30.0–36.0)
MCV: 97.2 fL (ref 78.0–100.0)
Platelets: 240 10*3/uL (ref 150–400)
RBC: 3.57 MIL/uL — ABNORMAL LOW (ref 4.22–5.81)
RDW: 15.4 % (ref 11.5–15.5)
WBC: 7.3 10*3/uL (ref 4.0–10.5)

## 2016-07-03 LAB — PROTIME-INR
INR: 1.1
Prothrombin Time: 14.3 seconds (ref 11.4–15.2)

## 2016-07-03 LAB — GLUCOSE, CAPILLARY
GLUCOSE-CAPILLARY: 196 mg/dL — AB (ref 65–99)
Glucose-Capillary: 328 mg/dL — ABNORMAL HIGH (ref 65–99)
Glucose-Capillary: 145 mg/dL — ABNORMAL HIGH (ref 65–99)
Glucose-Capillary: 179 mg/dL — ABNORMAL HIGH (ref 65–99)

## 2016-07-03 MED ORDER — WARFARIN SODIUM 10 MG PO TABS
10.0000 mg | ORAL_TABLET | Freq: Once | ORAL | Status: AC
  Administered 2016-07-03: 10 mg via ORAL
  Filled 2016-07-03: qty 1

## 2016-07-03 NOTE — Progress Notes (Signed)
PROGRESS NOTE    Aaron Long  OVZ:[id number] DOB: [birth year] DOA: 06/28/2016 PCP: Patient, No Pcp Per   Brief Narrative: 76 year old male admitted for SOB at rest, which resolved with oxygen. He was also found to be in new onset a fib with RVR and hypoglycemia. He was started on IV heparin after consulting GI, currently on coumadin , awaiting therapeutic INR.   Assessment & Plan:   Principal Problem:   Acute on chronic systolic and diastolic heart failure, NYHA class 4 (HCC) Active Problems:   HTN (hypertension)   CAD (coronary artery disease)   DM type 2, uncontrolled, with renal complications (HCC)   ESRD on dialysis [facility])   Pulmonary edema   Atrial fibrillation ([place])   New onset atrial fibrillation (HCC)   Acute on chronic systolic heart failure:  Fluid management as per HD.  Currently has diuresed about 5.5 lit since admission.  Daily weights,  Strict intake and output.  Reports feeling better. No new complaints.     ESRD on HD:  Further management as per renal.    Type 2 DM: CBG (last 3)   Recent Labs  07/03/16 0730 07/03/16 1143 07/03/16 1620  GLUCAP 328* 145* 196*    Resume SSI.     Hypertension: controlled.    New onset atrial fib : rate better controlled and in sinus.  Started on IV heparin and coumadin added on.  His [place] vasc is 6. INR sub therapeutic INR.   H/o CAD s/p CABG:  NO chest pain or sob. Today.  Resume home meds.     DVT prophylaxis: heparin , coumadin.  Code Status: full code.  Family Communication:  None at bedside.  Disposition Plan: pending therapeutic INR.    Consultants:   Cardiology  Nephrology.    Procedures: HD   Antimicrobials: NONE.    Subjective: No chest pain or sob. Sitting up to eat.   Objective: Vitals:   07/03/16 0838 07/03/16 1106 07/03/16 1210 07/03/16 1352  BP: (!) 145/55 (!) 145/63 (!) 151/65 (!) 123/55  Pulse: 73 73 76   Resp: 16 16 20    Temp: 98.9 F (37.2 C) 98.7 F (37.1 C)  98 F (36.7 C)   TempSrc: Oral Oral Oral   SpO2: 98% 100% 99%   Weight:      Height:        Intake/Output Summary (Last 24 hours) at 07/03/16 1837 Last data filed at 07/03/16 1701  Gross per 24 hour  Intake          1303.48 ml  Output              201 ml  Net          1102.48 ml   Filed Weights   07/02/16 0750 07/02/16 1210 07/03/16 0618  Weight: 75.3 kg (166 lb 0.1 oz) 73.3 kg (161 lb 9.6 oz) 75 kg (165 lb 5.5 oz)    Examination:  General exam: Appears calm and comfortable on 2 lit of [place] oygen.  Respiratory system: clear, no wheezing or rhonchi.  Cardiovascular system: S1 & S2 heard, regular, no JVD.  Gastrointestinal system: Abdomen is sof NT ND BS+ Central nervous system: Alert and oriented. No focal neurological deficits. Extremities: bilateral BKA, LUE  Fistula.  Skin: No rashes, lesions or ulcers Psychiatry: mood appropriate.     Data Reviewed: I have personally reviewed following labs and imaging studies  CBC:  Recent Labs Lab 06/28/16 0146 06/30/16 1513 07/01/16 0246 07/02/16  0431 07/03/16 0225  WBC 10.1 5.8 5.6 6.7 7.3  NEUTROABS 7.4  --   --   --   --   HGB 13.3 10.1* 11.2* 10.7* 10.7*  HCT 41.0 31.8* 36.3* 34.1* 34.7*  MCV 94.7 94.4 96.8 95.5 97.2  PLT 201 206 229 229 132   Basic Metabolic Panel:  Recent Labs Lab 06/28/16 0146 06/30/16 1513 07/02/16 0727  NA 131* 131* 131*  K 4.4 4.0 4.7  CL 94* 95* 94*  CO2 25 24 26   GLUCOSE 366* 235* 180*  BUN 44* 45* 37*  CREATININE 6.93* 6.69* 6.15*  CALCIUM 10.1 9.1 9.2  PHOS  --  4.7* 4.1   GFR: Estimated Creatinine Clearance: 9.9 mL/min (A) (by C-G formula based on SCr of 6.15 mg/dL (H)). Liver Function Tests:  Recent Labs Lab 06/30/16 1513 07/02/16 0727  ALBUMIN 2.9* 3.2*   No results for input(s): LIPASE, AMYLASE in the last 168 hours. No results for input(s): AMMONIA in the last 168 hours. Coagulation Profile:  Recent Labs Lab 06/29/16 1800 06/30/16 0150 07/01/16 0246  07/02/16 0431 07/03/16 0225  INR 0.98 1.01 1.23 1.06 1.10   Cardiac Enzymes:  Recent Labs Lab 06/28/16 0146 06/28/16 0511 06/28/16 1216 06/28/16 1828 06/29/16 1501  TROPONINI 0.14* 0.20* 0.26* 0.32* 0.16*   BNP (last 3 results) No results for input(s): PROBNP in the last 8760 hours. HbA1C: No results for input(s): HGBA1C in the last 72 hours. CBG:  Recent Labs Lab 07/02/16 1645 07/02/16 2058 07/03/16 0730 07/03/16 1143 07/03/16 1620  GLUCAP 181* 152* 328* 145* 196*   Lipid Profile: No results for input(s): CHOL, HDL, LDLCALC, TRIG, CHOLHDL, LDLDIRECT in the last 72 hours. Thyroid Function Tests: No results for input(s): TSH, T4TOTAL, FREET4, T3FREE, THYROIDAB in the last 72 hours. Anemia Panel: No results for input(s): VITAMINB12, FOLATE, FERRITIN, TIBC, IRON, RETICCTPCT in the last 72 hours. Sepsis Labs: No results for input(s): PROCALCITON, LATICACIDVEN in the last 168 hours.  Recent Results (from the past 240 hour(s))  MRSA PCR Screening     Status: None   Collection Time: 06/28/16 11:49 PM  Result Value Ref Range Status   MRSA by PCR NEGATIVE NEGATIVE Final    Comment:        The GeneXpert MRSA Assay (FDA approved for NASAL specimens only), is one component of a comprehensive MRSA colonization surveillance program. It is not intended to diagnose MRSA infection nor to guide or monitor treatment for MRSA infections.          Radiology Studies: No results found.      Scheduled Meds: . aspirin EC  81 mg Oral q morning - 10a  . carvedilol  6.25 mg Oral BID WC  . [START ON 07/05/2016] darbepoetin (ARANESP) injection - DIALYSIS  60 mcg Intravenous Q Mon-HD  . doxercalciferol  5 mcg Intravenous Q M,W,F-HD  . ferric citrate  420 mg Oral TID WC  . hydrALAZINE  25 mg Oral Q8H  . insulin aspart  0-9 Units Subcutaneous TID WC  . insulin detemir  13 Units Subcutaneous BID  . isosorbide mononitrate  30 mg Oral Daily  . polyethylene glycol  17 g Oral  Daily  . pravastatin  40 mg Oral q1800  . pregabalin  75 mg Oral Daily  . QUEtiapine  25 mg Oral Daily  . tamsulosin  0.4 mg Oral Daily  . Warfarin - Pharmacist Dosing Inpatient   Does not apply q1800   Continuous Infusions: . heparin 1,100 Units/hr (07/03/16 1719)  LOS: 3 days    Time spent: 35 minutes.     [name], MD [facility] Pager [phone]  If 7PM-7AM, please contact night-coverage [email or web address] Password [facility] 07/03/2016, 6:37 PM

## 2016-07-03 NOTE — Progress Notes (Signed)
ANTICOAGULATION CONSULT NOTE - Follow Up Consult  Pharmacy Consult for Heparin  Indication: atrial fibrillation  Allergies  Allergen Reactions  . Oxycodone Other (See Comments)    Hallucinations    Patient Measurements: Height: 5\' 8"  (172.7 cm) Weight: 161 lb 9.6 oz (73.3 kg) IBW/kg (Calculated) : 68.4  Vital Signs: Temp: 99.1 F (37.3 C) (06/22 2038) Temp Source: Oral (06/22 2038) BP: 120/54 (06/22 2203) Pulse Rate: 71 (06/22 2203)  Labs:  Recent Labs  06/30/16 1513 07/01/16 0246 07/01/16 0714 07/02/16 0431 07/02/16 0727 07/03/16 0225 07/03/16 0226  HGB 10.1* 11.2*  --  10.7*  --  10.7*  --   HCT 31.8* 36.3*  --  34.1*  --  34.7*  --   PLT 206 229  --  229  --  240  --   LABPROT  --  15.6*  --  13.8  --  14.3  --   INR  --  1.23  --  1.06  --  1.10  --   HEPARINUNFRC  --  >2.20* 0.37 0.31  --   --  0.24*  CREATININE 6.69*  --   --   --  6.15*  --   --     Estimated Creatinine Clearance: 9.9 mL/min (A) (by C-G formula based on SCr of 6.15 mg/dL (H)).   Assessment: Currently on heparin/warfarin overlap for afib, heparin level sub-therapeutic this AM, hx GIB, Hgb low/stable  Goal of Therapy:  Heparin level 0.3-0.7 units/ml Monitor platelets by anticoagulation protocol: Yes   Plan:  Inc heparin to 1100 units/hr 1400 HL  Mikkel Charrette 07/03/2016,5:19 AM

## 2016-07-03 NOTE — Progress Notes (Signed)
Subjective:   HD yest-removed 2000- tolerated well awaiting INR at goal - is only 1.1 today- no particular complaints- no SOB Objective Vital signs in last 24 hours: Vitals:   07/02/16 2203 07/03/16 0615 07/03/16 0618 07/03/16 0627  BP: (!) 120/54 (!) 131/54  (!) 133/53  Pulse: 71 72  74  Resp:  18  18  Temp:  98.4 F (36.9 C)  98.7 F (37.1 C)  TempSrc:  Oral  Oral  SpO2: 100% 98%  97%  Weight:   75 kg (165 lb 5.5 oz)   Height:       Weight change: 0.32 kg (11.3 oz)  Intake/Output Summary (Last 24 hours) at 07/03/16 0750 Last data filed at 07/03/16 1607  Gross per 24 hour  Intake           827.62 ml  Output             2200 ml  Net         -1372.38 ml   [place] MWF 4h 72.5kg Hep 2200 LUA AVF Hectoral  IV/HD Venofer 50 mg qwkly hd    Assessment/ Plan: Pt is a 76 y.o. yo male with ESRD who was admitted on 06/28/2016 with SOB- found to have volume overload- hosp c/p new onset a fib and hypoglycemia  Assessment/Plan: 1. SOB- due to volume vs afib- resolved.   2. ESRD - normally MWF at AF- via AVF- continue on schedule 3. Anemia- hgb 10.7- no ESA here- given 6/1 and 6/4- 50 both times- probably due for more and in the setting of anticoagulation will do  4. Secondary hyperparathyroidism- auryxia ordered here, phos is good - says was on hect as OP but not ordered here- have ordered  5. HTN/volume- BP soft- was on hydralazine 25 and coreg low dose as OP- continued  6. New onset a fib- new start to coumadin- awaiting INR over 2- not there yet  Mete Purdum A    Labs: Basic Metabolic Panel:  Recent Labs Lab 06/28/16 0146 06/30/16 1513 07/02/16 0727  NA 131* 131* 131*  K 4.4 4.0 4.7  CL 94* 95* 94*  CO2 25 24 26   GLUCOSE 366* 235* 180*  BUN 44* 45* 37*  CREATININE 6.93* 6.69* 6.15*  CALCIUM 10.1 9.1 9.2  PHOS  --  4.7* 4.1   Liver Function Tests:  Recent Labs Lab 06/30/16 1513 07/02/16 0727  ALBUMIN 2.9* 3.2*   No results for input(s):  LIPASE, AMYLASE in the last 168 hours. No results for input(s): AMMONIA in the last 168 hours. CBC:  Recent Labs Lab 06/28/16 0146 06/30/16 1513 07/01/16 0246 07/02/16 0431 07/03/16 0225  WBC 10.1 5.8 5.6 6.7 7.3  NEUTROABS 7.4  --   --   --   --   HGB 13.3 10.1* 11.2* 10.7* 10.7*  HCT 41.0 31.8* 36.3* 34.1* 34.7*  MCV 94.7 94.4 96.8 95.5 97.2  PLT 201 206 229 229 240   Cardiac Enzymes:  Recent Labs Lab 06/28/16 0146 06/28/16 0511 06/28/16 1216 06/28/16 1828 06/29/16 1501  TROPONINI 0.14* 0.20* 0.26* 0.32* 0.16*   CBG:  Recent Labs Lab 07/02/16 0735 07/02/16 1307 07/02/16 1645 07/02/16 2058 07/03/16 0730  GLUCAP 183* 151* 181* 152* 328*    Iron Studies: No results for input(s): IRON, TIBC, TRANSFERRIN, FERRITIN in the last 72 hours. Studies/Results: No results found. Medications: Infusions: . heparin 1,100 Units/hr (07/03/16 0612)    Scheduled Medications: . aspirin EC  81 mg Oral q morning - 10a  .  carvedilol  6.25 mg Oral BID WC  . [START ON 07/05/2016] darbepoetin (ARANESP) injection - DIALYSIS  60 mcg Intravenous Q Mon-HD  . doxercalciferol  5 mcg Intravenous Q M,W,F-HD  . ferric citrate  420 mg Oral TID WC  . hydrALAZINE  25 mg Oral Q8H  . insulin aspart  0-9 Units Subcutaneous TID WC  . insulin detemir  13 Units Subcutaneous BID  . isosorbide mononitrate  30 mg Oral Daily  . polyethylene glycol  17 g Oral Daily  . pravastatin  40 mg Oral q1800  . pregabalin  75 mg Oral Daily  . QUEtiapine  25 mg Oral Daily  . tamsulosin  0.4 mg Oral Daily  . Warfarin - Pharmacist Dosing Inpatient   Does not apply q1800    have reviewed scheduled and prn medications.  Physical Exam: General:NAD Heart:RRR- seems NS Lungs:clear Abdomen: soft,non tender Extremities:bilat amps- no obvious edema Dialysis Access: left AVF - bandaged heavily.Marland Kitchentook off- has skin abrasion from needle it looks like- not currently bleeding     07/03/2016,7:50 AM  LOS: 3 days

## 2016-07-03 NOTE — Progress Notes (Signed)
ANTICOAGULATION CONSULT NOTE - Follow Up Consult  Pharmacy Consult for Warfarin with Heparin [place] Indication: atrial fibrillation  Allergies  Allergen Reactions  . Oxycodone Other (See Comments)    Hallucinations    Patient Measurements: Height: 5\' 8"  (172.7 cm) Weight: 165 lb 5.5 oz (75 kg) IBW/kg (Calculated) : 68.4 Heparin Dosing Weight: 73 kg  Vital Signs: Temp: 98 F (36.7 C) (06/23 1210) Temp Source: Oral (06/23 1210) BP: 151/65 (06/23 1210) Pulse Rate: 76 (06/23 1210)  Labs:  Recent Labs  06/30/16 1513  07/01/16 0246 07/01/16 0714 07/02/16 0431 07/02/16 0727 07/03/16 0225 07/03/16 0226  HGB 10.1*  --  11.2*  --  10.7*  --  10.7*  --   HCT 31.8*  --  36.3*  --  34.1*  --  34.7*  --   PLT 206  --  229  --  229  --  240  --   LABPROT  --   --  15.6*  --  13.8  --  14.3  --   INR  --   --  1.23  --  1.06  --  1.10  --   HEPARINUNFRC  --   < > >2.20* 0.37 0.31  --   --  0.24*  CREATININE 6.69*  --   --   --   --  6.15*  --   --   < > = values in this interval not displayed.  Estimated Creatinine Clearance: 9.9 mL/min (A) (by C-G formula based on SCr of 6.15 mg/dL (H)).  Assessment: Heparin and Coumadin begin on 6/19 for new onset atrial fibrillation. Patients is also ESRD on HD. CHA2DS2/VASc at least 6 (age, HTN, DM, CHF, PAD).   Heparin level is therapeutic at 0.31. Hemoglobin is low/stable at 10.7 with no further notes of bleeding.   INR today remains subtherapeutic at 1.10, which not much of an increase from yesterday. He has received all 5mg  doses since starting, with one 7.5mg  dose last night. Will further boost dose tonight since INR does not appear to be responding much after 4 days.   Goal of Therapy:  INR 2-3 Heparin level 0.3-0.7 units/ml Monitor platelets by anticoagulation protocol: Yes   Plan:  Continue heparin drip at 1100 units/hr. Warfarin 10mg  x1 today Daily heparin level, CBC and PT/INR. Aim for lower end of therapeutic ranges for  heparin and warfarin dosing. Monitor for any bleeding.  [name], PharmD Acute Care Pharmacy Resident  Pager: [phone] 07/03/2016

## 2016-07-04 LAB — CBC
HCT: 32 % — ABNORMAL LOW (ref 39.0–52.0)
HEMOGLOBIN: 10.2 g/dL — AB (ref 13.0–17.0)
MCH: 30.3 pg (ref 26.0–34.0)
MCHC: 31.9 g/dL (ref 30.0–36.0)
MCV: 95 fL (ref 78.0–100.0)
Platelets: 211 10*3/uL (ref 150–400)
RBC: 3.37 MIL/uL — AB (ref 4.22–5.81)
RDW: 15.2 % (ref 11.5–15.5)
WBC: 7.9 10*3/uL (ref 4.0–10.5)

## 2016-07-04 LAB — PROTIME-INR
INR: 1.33
PROTHROMBIN TIME: 16.6 s — AB (ref 11.4–15.2)

## 2016-07-04 LAB — GLUCOSE, CAPILLARY
GLUCOSE-CAPILLARY: 81 mg/dL (ref 65–99)
GLUCOSE-CAPILLARY: 148 mg/dL — AB (ref 65–99)
Glucose-Capillary: 225 mg/dL — ABNORMAL HIGH (ref 65–99)
Glucose-Capillary: 249 mg/dL — ABNORMAL HIGH (ref 65–99)

## 2016-07-04 LAB — HEPARIN LEVEL (UNFRACTIONATED): Heparin Unfractionated: 0.33 IU/mL (ref 0.30–0.70)

## 2016-07-04 MED ORDER — WARFARIN SODIUM 7.5 MG PO TABS
7.5000 mg | ORAL_TABLET | Freq: Once | ORAL | Status: AC
  Administered 2016-07-04: 7.5 mg via ORAL
  Filled 2016-07-04: qty 1

## 2016-07-04 NOTE — Progress Notes (Signed)
PROGRESS NOTE    Aaron Long  YOV:[id number] DOB: 11/27/1940 DOA: 06/28/2016 PCP: Patient, No Pcp Per   Brief Narrative: 76 year old male admitted for SOB at rest, which resolved with oxygen. He was also found to be in new onset a fib with RVR and hypoglycemia. He was started on IV heparin after consulting GI, currently on coumadin , awaiting therapeutic INR.   Assessment & Plan:   Principal Problem:   Acute on chronic systolic and diastolic heart failure, NYHA class 4 (HCC) Active Problems:   HTN (hypertension)   CAD (coronary artery disease)   DM type 2, uncontrolled, with renal complications (HCC)   ESRD on dialysis [facility])   Pulmonary edema   Atrial fibrillation ([place])   New onset atrial fibrillation (HCC)   Acute on chronic systolic heart failure:  Fluid management as per HD. Daily weights,  Strict intake and output.  Reports feeling better. No new complaints.      ESRD on HD:  Further management as per renal.    Type 2 DM: CBG (last 3)   Recent Labs  07/04/16 0736 07/04/16 1144 07/04/16 1552  GLUCAP 81 148* 225*    Resume SSI.  ON 13 units of Levemir.     Hypertension: controlled.    New onset atrial fib : rate better controlled and in sinus.  Started on IV heparin and coumadin added on.  His [place] vasc is 6. INR sub therapeutic at 1.3  H/o CAD s/p CABG:  NO chest pain or sob.  Resume home meds.     DVT prophylaxis: heparin , coumadin.  Code Status: full code.  Family Communication:  None at bedside.  Disposition Plan: pending therapeutic INR.    Consultants:   Cardiology  Nephrology.    Procedures: HD   Antimicrobials: NONE.    Subjective: No chest pain or sob.  No nausea or vomiting.  No headache or dizziness.  He is upset that he has to stay in the hospital for his INR to be therapeutic.   Objective: Vitals:   07/04/16 0430 07/04/16 0740 07/04/16 1149 07/04/16 1337  BP: (!) 131/53 (!) 151/60 (!) 140/55 (!) 130/53    Pulse: 64 66 66 65  Resp: 18 16 16    Temp: 97.9 F (36.6 C) 97.4 F (36.3 C) 97.9 F (36.6 C)   TempSrc: Oral Oral Oral   SpO2: 97% 98%    Weight: 76.7 kg (169 lb 1.5 oz)     Height:        Intake/Output Summary (Last 24 hours) at 07/04/16 1707 Last data filed at 07/04/16 1046  Gross per 24 hour  Intake           829.82 ml  Output              176 ml  Net           653.82 ml   Filed Weights   07/02/16 1210 07/03/16 0618 07/04/16 0430  Weight: 73.3 kg (161 lb 9.6 oz) 75 kg (165 lb 5.5 oz) 76.7 kg (169 lb 1.5 oz)    Examination:  General exam: Appears calm and comfortable on 2 lit of [place] oygen.  Respiratory system: clear to auscultation. No wheezing or rhonchi.  Cardiovascular system: S1 & S2 heard, regular, no JVD.  Gastrointestinal system: abdomen soft non tender non distended bowel sounds heard.  Central nervous system: Alert and oriented. No focal neurological deficits. Extremities: bilateral BKA, LUE  Fistula.  Skin: No rashes,  lesions or ulcers.  Psychiatry: mood appropriate.     Data Reviewed: I have personally reviewed following labs and imaging studies  CBC:  Recent Labs Lab 06/28/16 0146 06/30/16 1513 07/01/16 0246 07/02/16 0431 07/03/16 0225 07/04/16 0454  WBC 10.1 5.8 5.6 6.7 7.3 7.9  NEUTROABS 7.4  --   --   --   --   --   HGB 13.3 10.1* 11.2* 10.7* 10.7* 10.2*  HCT 41.0 31.8* 36.3* 34.1* 34.7* 32.0*  MCV 94.7 94.4 96.8 95.5 97.2 95.0  PLT 201 206 229 229 240 510   Basic Metabolic Panel:  Recent Labs Lab 06/28/16 0146 06/30/16 1513 07/02/16 0727  NA 131* 131* 131*  K 4.4 4.0 4.7  CL 94* 95* 94*  CO2 25 24 26   GLUCOSE 366* 235* 180*  BUN 44* 45* 37*  CREATININE 6.93* 6.69* 6.15*  CALCIUM 10.1 9.1 9.2  PHOS  --  4.7* 4.1   GFR: Estimated Creatinine Clearance: 9.9 mL/min (A) (by C-G formula based on SCr of 6.15 mg/dL (H)). Liver Function Tests:  Recent Labs Lab 06/30/16 1513 07/02/16 0727  ALBUMIN 2.9* 3.2*   No results for  input(s): LIPASE, AMYLASE in the last 168 hours. No results for input(s): AMMONIA in the last 168 hours. Coagulation Profile:  Recent Labs Lab 06/30/16 0150 07/01/16 0246 07/02/16 0431 07/03/16 0225 07/04/16 0454  INR 1.01 1.23 1.06 1.10 1.33   Cardiac Enzymes:  Recent Labs Lab 06/28/16 0146 06/28/16 0511 06/28/16 1216 06/28/16 1828 06/29/16 1501  TROPONINI 0.14* 0.20* 0.26* 0.32* 0.16*   BNP (last 3 results) No results for input(s): PROBNP in the last 8760 hours. HbA1C: No results for input(s): HGBA1C in the last 72 hours. CBG:  Recent Labs Lab 07/03/16 1620 07/03/16 2124 07/04/16 0736 07/04/16 1144 07/04/16 1552  GLUCAP 196* 179* 81 148* 225*   Lipid Profile: No results for input(s): CHOL, HDL, LDLCALC, TRIG, CHOLHDL, LDLDIRECT in the last 72 hours. Thyroid Function Tests: No results for input(s): TSH, T4TOTAL, FREET4, T3FREE, THYROIDAB in the last 72 hours. Anemia Panel: No results for input(s): VITAMINB12, FOLATE, FERRITIN, TIBC, IRON, RETICCTPCT in the last 72 hours. Sepsis Labs: No results for input(s): PROCALCITON, LATICACIDVEN in the last 168 hours.  Recent Results (from the past 240 hour(s))  MRSA PCR Screening     Status: None   Collection Time: 06/28/16 11:49 PM  Result Value Ref Range Status   MRSA by PCR NEGATIVE NEGATIVE Final    Comment:        The GeneXpert MRSA Assay (FDA approved for NASAL specimens only), is one component of a comprehensive MRSA colonization surveillance program. It is not intended to diagnose MRSA infection nor to guide or monitor treatment for MRSA infections.          Radiology Studies: No results found.      Scheduled Meds: . aspirin EC  81 mg Oral q morning - 10a  . carvedilol  6.25 mg Oral BID WC  . [START ON 07/05/2016] darbepoetin (ARANESP) injection - DIALYSIS  60 mcg Intravenous Q Mon-HD  . doxercalciferol  5 mcg Intravenous Q M,W,F-HD  . ferric citrate  420 mg Oral TID WC  . hydrALAZINE   25 mg Oral Q8H  . insulin aspart  0-9 Units Subcutaneous TID WC  . insulin detemir  13 Units Subcutaneous BID  . isosorbide mononitrate  30 mg Oral Daily  . polyethylene glycol  17 g Oral Daily  . pravastatin  40 mg Oral q1800  .  pregabalin  75 mg Oral Daily  . QUEtiapine  25 mg Oral Daily  . tamsulosin  0.4 mg Oral Daily  . warfarin  7.5 mg Oral ONCE-1800  . Warfarin - Pharmacist Dosing Inpatient   Does not apply q1800   Continuous Infusions: . heparin 1,100 Units/hr (07/04/16 0830)     LOS: 4 days    Time spent: 35 minutes.     [name], MD [facility] Pager [phone]  If 7PM-7AM, please contact night-coverage [email or web address] Password [place] 07/04/2016, 5:07 PM

## 2016-07-04 NOTE — Progress Notes (Signed)
ANTICOAGULATION CONSULT NOTE - Follow Up Consult  Pharmacy Consult for Warfarin with Heparin Bridge Indication: atrial fibrillation  Assessment: Heparin and Coumadin begin on 6/19 for new onset atrial fibrillation. Patients is also ESRD on HD. CHA2DS2/VASc at least 6 (age, HTN, DM, CHF, PAD).   Heparin level is therapeutic at 0.33 on 1100 units/hr. Hemoglobin is low/stable at 10.2 with no further notes of bleeding.   INR today remains subtherapeutic at 1.33, slowly trending up. Patient received a warfarin 10mg  dose last night.   Goal of Therapy:  INR 2-3 Heparin level 0.3-0.7 units/ml Monitor platelets by anticoagulation protocol: Yes   Plan:  Continue heparin drip at 1100 units/hr. Warfarin 7.5mg  x1 today Daily heparin level, CBC and PT/INR. Aim for lower end of therapeutic ranges for heparin and warfarin dosing. Monitor for any bleeding.   Allergies  Allergen Reactions  . Oxycodone Other (See Comments)    Hallucinations    Patient Measurements: Height: 5\' 8"  (172.7 cm) Weight: 169 lb 1.5 oz (76.7 kg) IBW/kg (Calculated) : 68.4 Heparin Dosing Weight: 73 kg  Vital Signs: Temp: 97.9 F (36.6 C) (06/24 0430) Temp Source: Oral (06/24 0430) BP: 131/53 (06/24 0430) Pulse Rate: 64 (06/24 0430)  Labs:  Recent Labs  07/02/16 0431 07/02/16 0727 07/03/16 0225 07/03/16 0226 07/03/16 1433 07/04/16 0454  HGB 10.7*  --  10.7*  --   --  10.2*  HCT 34.1*  --  34.7*  --   --  32.0*  PLT 229  --  240  --   --  211  LABPROT 13.8  --  14.3  --   --  16.6*  INR 1.06  --  1.10  --   --  1.33  HEPARINUNFRC 0.31  --   --  0.24* 0.31 0.33  CREATININE  --  6.15*  --   --   --   --     Estimated Creatinine Clearance: 9.9 mL/min (A) (by C-G formula based on SCr of 6.15 mg/dL (H)).  Belia Heman, PharmD PGY1 Pharmacy Resident [phone] (Pager) 07/04/2016 7:25 AM

## 2016-07-04 NOTE — Progress Notes (Signed)
Subjective:    awaiting INR at goal - is only 1.33 today- no particular complaints- no SOB Objective Vital signs in last 24 hours: Vitals:   07/03/16 1352 07/03/16 2040 07/04/16 0430 07/04/16 0740  BP: (!) 123/55 (!) 156/63 (!) 131/53 (!) 151/60  Pulse:  69 64 66  Resp:  18 18 16   Temp:  98.5 F (36.9 C) 97.9 F (36.6 C) 97.4 F (36.3 C)  TempSrc:  Oral Oral Oral  SpO2:  98% 97% 98%  Weight:   76.7 kg (169 lb 1.5 oz)   Height:       Weight change: 1.4 kg (3 lb 1.4 oz)  Intake/Output Summary (Last 24 hours) at 07/04/16 9323 Last data filed at 07/04/16 5573  Gross per 24 hour  Intake          1605.68 ml  Output                2 ml  Net          1603.68 ml   [facility] MWF 4h 72.5kg Hep 2200 LUA AVF Hectoral  IV/HD Venofer 50 mg qwkly hd    Assessment/ Plan: Pt is a 76 y.o. yo male with ESRD who was admitted on 06/28/2016 with SOB- found to have volume overload- hosp c/p new onset a fib and hypoglycemia  Assessment/Plan: 1. SOB- due to volume vs afib- resolved.   2. ESRD - normally MWF at AF- via AVF- continue on schedule 3. Anemia- hgb 10.2- no ESA here- given 6/1 and 6/4- 50 both times- probably due for more and in the setting of anticoagulation will do  4. Secondary hyperparathyroidism- auryxia ordered here, phos is good - says was on hect as OP but not ordered here- have ordered  5. HTN/volume- BP good- was on hydralazine 25 and coreg low dose as OP- continued  6. New onset a fib- new start to coumadin- awaiting INR over 2- not there yet  Aaron Long A    Labs: Basic Metabolic Panel:  Recent Labs Lab 06/28/16 0146 06/30/16 1513 07/02/16 0727  NA 131* 131* 131*  K 4.4 4.0 4.7  CL 94* 95* 94*  CO2 25 24 26   GLUCOSE 366* 235* 180*  BUN 44* 45* 37*  CREATININE 6.93* 6.69* 6.15*  CALCIUM 10.1 9.1 9.2  PHOS  --  4.7* 4.1   Liver Function Tests:  Recent Labs Lab 06/30/16 1513 07/02/16 0727  ALBUMIN 2.9* 3.2*   No results for input(s):  LIPASE, AMYLASE in the last 168 hours. No results for input(s): AMMONIA in the last 168 hours. CBC:  Recent Labs Lab 06/28/16 0146 06/30/16 1513 07/01/16 0246 07/02/16 0431 07/03/16 0225 07/04/16 0454  WBC 10.1 5.8 5.6 6.7 7.3 7.9  NEUTROABS 7.4  --   --   --   --   --   HGB 13.3 10.1* 11.2* 10.7* 10.7* 10.2*  HCT 41.0 31.8* 36.3* 34.1* 34.7* 32.0*  MCV 94.7 94.4 96.8 95.5 97.2 95.0  PLT 201 206 229 229 240 211   Cardiac Enzymes:  Recent Labs Lab 06/28/16 0146 06/28/16 0511 06/28/16 1216 06/28/16 1828 06/29/16 1501  TROPONINI 0.14* 0.20* 0.26* 0.32* 0.16*   CBG:  Recent Labs Lab 07/03/16 0730 07/03/16 1143 07/03/16 1620 07/03/16 2124 07/04/16 0736  GLUCAP 328* 145* 196* 179* 81    Iron Studies: No results for input(s): IRON, TIBC, TRANSFERRIN, FERRITIN in the last 72 hours. Studies/Results: No results found. Medications: Infusions: . heparin 1,100 Units/hr (07/04/16 0830)  Scheduled Medications: . aspirin EC  81 mg Oral q morning - 10a  . carvedilol  6.25 mg Oral BID WC  . [START ON 07/05/2016] darbepoetin (ARANESP) injection - DIALYSIS  60 mcg Intravenous Q Mon-HD  . doxercalciferol  5 mcg Intravenous Q M,W,F-HD  . ferric citrate  420 mg Oral TID WC  . hydrALAZINE  25 mg Oral Q8H  . insulin aspart  0-9 Units Subcutaneous TID WC  . insulin detemir  13 Units Subcutaneous BID  . isosorbide mononitrate  30 mg Oral Daily  . polyethylene glycol  17 g Oral Daily  . pravastatin  40 mg Oral q1800  . pregabalin  75 mg Oral Daily  . QUEtiapine  25 mg Oral Daily  . tamsulosin  0.4 mg Oral Daily  . warfarin  7.5 mg Oral ONCE-1800  . Warfarin - Pharmacist Dosing Inpatient   Does not apply q1800    have reviewed scheduled and prn medications.  Physical Exam: General:NAD Heart:RRR- seems NS Lungs:clear Abdomen: soft,non tender Extremities:bilat amps- no obvious edema Dialysis Access: left AVF - bandaged heavily.Marland Kitchentook off- has skin abrasion from needle it  looks like- not currently bleeding     07/04/2016,9:03 AM  LOS: 4 days

## 2016-07-04 NOTE — Progress Notes (Signed)
Patient resting quietly at this time. No signs and symptoms of distress noted. Heparin at 11 ml/hr in progress. No active bleeding noted. Will continue to monitor.

## 2016-07-05 LAB — CBC
HCT: 31.4 % — ABNORMAL LOW (ref 39.0–52.0)
HCT: 31.6 % — ABNORMAL LOW (ref 39.0–52.0)
Hemoglobin: 9.9 g/dL — ABNORMAL LOW (ref 13.0–17.0)
Hemoglobin: 10 g/dL — ABNORMAL LOW (ref 13.0–17.0)
MCH: 29.9 pg (ref 26.0–34.0)
MCH: 29.7 pg (ref 26.0–34.0)
MCHC: 31.5 g/dL (ref 30.0–36.0)
MCHC: 31.6 g/dL (ref 30.0–36.0)
MCV: 94.9 fL (ref 78.0–100.0)
MCV: 93.8 fL (ref 78.0–100.0)
PLATELETS: 234 10*3/uL (ref 150–400)
PLATELETS: 236 10*3/uL (ref 150–400)
RBC: 3.31 MIL/uL — AB (ref 4.22–5.81)
RBC: 3.37 MIL/uL — ABNORMAL LOW (ref 4.22–5.81)
RDW: 15.1 % (ref 11.5–15.5)
RDW: 15.1 % (ref 11.5–15.5)
WBC: 7.2 10*3/uL (ref 4.0–10.5)
WBC: 7.7 10*3/uL (ref 4.0–10.5)

## 2016-07-05 LAB — RENAL FUNCTION PANEL
Albumin: 3.3 g/dL — ABNORMAL LOW (ref 3.5–5.0)
Anion gap: 10 (ref 5–15)
BUN: 68 mg/dL — ABNORMAL HIGH (ref 6–20)
CALCIUM: 9.3 mg/dL (ref 8.9–10.3)
CO2: 26 mmol/L (ref 22–32)
CREATININE: 8.67 mg/dL — AB (ref 0.61–1.24)
Chloride: 88 mmol/L — ABNORMAL LOW (ref 101–111)
GFR calc Af Amer: 6 mL/min — ABNORMAL LOW (ref >60)
GFR calc non Af Amer: 5 mL/min — ABNORMAL LOW (ref >60)
GLUCOSE: 199 mg/dL — AB (ref 65–99)
Phosphorus: 5 mg/dL — ABNORMAL HIGH (ref 2.5–4.6)
Potassium: 5.4 mmol/L — ABNORMAL HIGH (ref 3.5–5.1)
SODIUM: 124 mmol/L — AB (ref 135–145)

## 2016-07-05 LAB — PROTIME-INR
INR: 1.74
PROTHROMBIN TIME: 20.6 s — AB (ref 11.4–15.2)

## 2016-07-05 LAB — GLUCOSE, CAPILLARY
Glucose-Capillary: 235 mg/dL — ABNORMAL HIGH (ref 65–99)
Glucose-Capillary: 224 mg/dL — ABNORMAL HIGH (ref 65–99)
Glucose-Capillary: 173 mg/dL — ABNORMAL HIGH (ref 65–99)
Glucose-Capillary: 202 mg/dL — ABNORMAL HIGH (ref 65–99)

## 2016-07-05 LAB — HEPARIN LEVEL (UNFRACTIONATED)
HEPARIN UNFRACTIONATED: 0.25 [IU]/mL — AB (ref 0.30–0.70)
HEPARIN UNFRACTIONATED: 0.4 [IU]/mL (ref 0.30–0.70)

## 2016-07-05 MED ORDER — HEPARIN SODIUM (PORCINE) 1000 UNIT/ML DIALYSIS: 20.0000 [IU]/kg | INTRAMUSCULAR | Status: DC | PRN

## 2016-07-05 MED ORDER — DOXERCALCIFEROL 4 MCG/2ML IV SOLN
INTRAVENOUS | Status: AC
  Filled 2016-07-05: qty 4

## 2016-07-05 MED ORDER — DARBEPOETIN ALFA 60 MCG/0.3ML IJ SOSY
PREFILLED_SYRINGE | INTRAMUSCULAR | Status: AC
  Filled 2016-07-05: qty 0.3

## 2016-07-05 MED ORDER — WARFARIN SODIUM 7.5 MG PO TABS
7.5000 mg | ORAL_TABLET | Freq: Once | ORAL | Status: AC
  Administered 2016-07-05: 7.5 mg via ORAL
  Filled 2016-07-05: qty 1

## 2016-07-05 MED ORDER — INSULIN ASPART 100 UNIT/ML ~~LOC~~ SOLN: 2.0000 [IU] | Freq: Three times a day (TID) | SUBCUTANEOUS | Status: DC

## 2016-07-05 NOTE — Progress Notes (Signed)
Patient back from HD, no complaints at this moment.  Will continue to monitor.  Juwaun Inskeep, RN

## 2016-07-05 NOTE — Progress Notes (Signed)
PROGRESS NOTE    Aaron Long  OAC:[id number] DOB: 1940/01/13 DOA: 06/28/2016 PCP: Patient, No Pcp Per   Brief Narrative: 76 year old male admitted for SOB at rest, which resolved with oxygen. He was also found to be in new onset a fib with RVR and hypoglycemia. He was started on IV heparin after consulting GI, currently on coumadin , awaiting for therapeutic INR.  Patient frustrated about not going home sooner.   Assessment & Plan:   Principal Problem:   Acute on chronic systolic and diastolic heart failure, NYHA class 4 (HCC) Active Problems:   HTN (hypertension)   CAD (coronary artery disease)   DM type 2, uncontrolled, with renal complications (HCC)   ESRD on dialysis [facility])   Pulmonary edema   Atrial fibrillation ([place])   New onset atrial fibrillation (HCC)   Acute on chronic systolic heart failure:  Fluid management as per HD. Daily weights,  Strict intake and output.  Reports feeling better. No new complaints.  He is off oxygen.      ESRD on HD:  Further management as per renal.    Type 2 DM: CBG (last 3)   Recent Labs  07/05/16 0718 07/05/16 1153 07/05/16 1556  GLUCAP 235* 224* 173*    Resume SSI.  ON 13 units of Levemir. Added 2 units of novolog tidac.  hgba1c is 10.7 on 4//18, better than last year    Hypoglycemia: episodes resolved.   Peripheral neuropathy:  Resume Lyrica.    BPH;  Resume flomax.   Constipation: BM today .resolved.   Hypertension: controlled.    New onset atrial fib : rate better controlled and in sinus.  Started on IV heparin and coumadin added on.  His [place] vasc is 6. INR sub therapeutic at 1.7  H/o CAD s/p CABG:  NO chest pain or sob.  Resume home meds.    Anemia of chronic disease:  Normocytic, slight drop possibly from being on IV heparin and coumadin.  Monitor hemoglobin daily.    DVT prophylaxis: heparin , coumadin.  Code Status: full code.  Family Communication:  None at bedside.  Disposition  Plan: pending therapeutic INR.  Possibly home iin am.   Consultants:   Cardiology  Nephrology.    Procedures: HD   Antimicrobials: NONE.    Subjective: Wants to g home, denies any complaints today.   Objective: Vitals:   07/04/16 1337 07/04/16 2041 07/05/16 0528 07/05/16 1155  BP: (!) 130/53 137/61 (!) 156/55 (!) 144/72  Pulse: 65 70 68 71  Resp:  16 18   Temp:  98.1 F (36.7 C) 97.5 F (36.4 C) 98.1 F (36.7 C)  TempSrc:  Oral Oral Oral  SpO2:  97% 97% 97%  Weight:   78.3 kg (172 lb 9.9 oz)   Height:        Intake/Output Summary (Last 24 hours) at 07/05/16 1613 Last data filed at 07/05/16 1400  Gross per 24 hour  Intake           792.55 ml  Output              276 ml  Net           516.55 ml   Filed Weights   07/03/16 0618 07/04/16 0430 07/05/16 0528  Weight: 75 kg (165 lb 5.5 oz) 76.7 kg (169 lb 1.5 oz) 78.3 kg (172 lb 9.9 oz)    Examination:  General exam: Appears calm and comfortable  Respiratory system: good air entry,  no wheezing or rhonchi.   Cardiovascular system: S1 & S2 heard, regular, no JVD.  Gastrointestinal system: abdomen soft NT ND BS+ Central nervous system: Alert and oriented. No focal neurological deficits. Extremities: bilateral BKA, LUE  Fistula.  Skin: No rashes, lesions or ulcers.  Psychiatry: mood appropriate.     Data Reviewed: I have personally reviewed following labs and imaging studies  CBC:  Recent Labs Lab 07/01/16 0246 07/02/16 0431 07/03/16 0225 07/04/16 0454 07/05/16 0319  WBC 5.6 6.7 7.3 7.9 7.2  HGB 11.2* 10.7* 10.7* 10.2* 9.9*  HCT 36.3* 34.1* 34.7* 32.0* 31.4*  MCV 96.8 95.5 97.2 95.0 94.9  PLT 229 229 240 211 696   Basic Metabolic Panel:  Recent Labs Lab 06/30/16 1513 07/02/16 0727  NA 131* 131*  K 4.0 4.7  CL 95* 94*  CO2 24 26  GLUCOSE 235* 180*  BUN 45* 37*  CREATININE 6.69* 6.15*  CALCIUM 9.1 9.2  PHOS 4.7* 4.1   GFR: Estimated Creatinine Clearance: 9.9 mL/min (A) (by C-G formula  based on SCr of 6.15 mg/dL (H)). Liver Function Tests:  Recent Labs Lab 06/30/16 1513 07/02/16 0727  ALBUMIN 2.9* 3.2*   No results for input(s): LIPASE, AMYLASE in the last 168 hours. No results for input(s): AMMONIA in the last 168 hours. Coagulation Profile:  Recent Labs Lab 07/01/16 0246 07/02/16 0431 07/03/16 0225 07/04/16 0454 07/05/16 0319  INR 1.23 1.06 1.10 1.33 1.74   Cardiac Enzymes:  Recent Labs Lab 06/28/16 1828 06/29/16 1501  TROPONINI 0.32* 0.16*   BNP (last 3 results) No results for input(s): PROBNP in the last 8760 hours. HbA1C: No results for input(s): HGBA1C in the last 72 hours. CBG:  Recent Labs Lab 07/04/16 1552 07/04/16 2113 07/05/16 0718 07/05/16 1153 07/05/16 1556  GLUCAP 225* 249* 235* 224* 173*   Lipid Profile: No results for input(s): CHOL, HDL, LDLCALC, TRIG, CHOLHDL, LDLDIRECT in the last 72 hours. Thyroid Function Tests: No results for input(s): TSH, T4TOTAL, FREET4, T3FREE, THYROIDAB in the last 72 hours. Anemia Panel: No results for input(s): VITAMINB12, FOLATE, FERRITIN, TIBC, IRON, RETICCTPCT in the last 72 hours. Sepsis Labs: No results for input(s): PROCALCITON, LATICACIDVEN in the last 168 hours.  Recent Results (from the past 240 hour(s))  MRSA PCR Screening     Status: None   Collection Time: 06/28/16 11:49 PM  Result Value Ref Range Status   MRSA by PCR NEGATIVE NEGATIVE Final    Comment:        The GeneXpert MRSA Assay (FDA approved for NASAL specimens only), is one component of a comprehensive MRSA colonization surveillance program. It is not intended to diagnose MRSA infection nor to guide or monitor treatment for MRSA infections.          Radiology Studies: No results found.      Scheduled Meds: . aspirin EC  81 mg Oral q morning - 10a  . carvedilol  6.25 mg Oral BID WC  . darbepoetin (ARANESP) injection - DIALYSIS  60 mcg Intravenous Q Mon-HD  . doxercalciferol  5 mcg Intravenous Q  M,W,F-HD  . ferric citrate  420 mg Oral TID WC  . hydrALAZINE  25 mg Oral Q8H  . insulin aspart  0-9 Units Subcutaneous TID WC  . insulin aspart  2 Units Subcutaneous TID WC  . insulin detemir  13 Units Subcutaneous BID  . isosorbide mononitrate  30 mg Oral Daily  . polyethylene glycol  17 g Oral Daily  . pravastatin  40 mg Oral  q1800  . pregabalin  75 mg Oral Daily  . QUEtiapine  25 mg Oral Daily  . tamsulosin  0.4 mg Oral Daily  . warfarin  7.5 mg Oral ONCE-1800  . Warfarin - Pharmacist Dosing Inpatient   Does not apply q1800   Continuous Infusions: . heparin 1,200 Units/hr (07/05/16 0928)     LOS: 5 days    Time spent: 35 minutes.     [name], MD [facility] Pager [phone]  If 7PM-7AM, please contact night-coverage [email or web address] Password [facility] 07/05/2016, 4:13 PM

## 2016-07-05 NOTE — Progress Notes (Signed)
Subjective:    INR up 1.7 today. No new c/o from pt  Objective Vital signs in last 24 hours: Vitals:   07/04/16 1149 07/04/16 1337 07/04/16 2041 07/05/16 0528  BP: (!) 140/55 (!) 130/53 137/61 (!) 156/55  Pulse: 66 65 70 68  Resp: 16  16 18   Temp: 97.9 F (36.6 C)  98.1 F (36.7 C) 97.5 F (36.4 C)  TempSrc: Oral  Oral Oral  SpO2:   97% 97%  Weight:    78.3 kg (172 lb 9.9 oz)  Height:       Weight change: 1.6 kg (3 lb 8.4 oz)  Intake/Output Summary (Last 24 hours) at 07/05/16 1003 Last data filed at 07/05/16 0901  Gross per 24 hour  Intake              951 ml  Output              326 ml  Net              625 ml   [facility] MWF 4h 72.5kg Hep 2200 LUA AVF Hectoral  IV/HD Venofer 50 mg qwkly hd    Summary: Pt is a 76 y.o. yo male with ESRD who was admitted on 06/28/2016 with SOB- found to have volume overload- hosp c/p new onset a fib and hypoglycemia    Assess/Plan: 1. SOB- due to volume vs afib- resolved.   2. ESRD - normally MWF at AF- via AVF- continue on schedule 3. Anemia- hgb 10.2- no ESA here- given 6/1 and 6/4- 50 both times- probably due for more and in the setting of anticoagulation will do  4. Secondary hyperparathyroidism- auryxia ordered here, phos is good - says was on hect as OP but not ordered here- have ordered  5. HTN/volume- BP good- was on hydralazine 25 and coreg low dose as OP. Wt's creeped back up and if 5kg over today.  Plan ^UF on HD today.  6. New onset a fib- new start to coumadin- awaiting INR over 2  Kelly Splinter MD [employer] pgr [phone]   07/05/2016, 10:23 AM     Labs: Basic Metabolic Panel:  Recent Labs Lab 06/30/16 1513 07/02/16 0727  NA 131* 131*  K 4.0 4.7  CL 95* 94*  CO2 24 26  GLUCOSE 235* 180*  BUN 45* 37*  CREATININE 6.69* 6.15*  CALCIUM 9.1 9.2  PHOS 4.7* 4.1   Liver Function Tests:  Recent Labs Lab 06/30/16 1513 07/02/16 0727  ALBUMIN 2.9* 3.2*   No results for input(s):  LIPASE, AMYLASE in the last 168 hours. No results for input(s): AMMONIA in the last 168 hours. CBC:  Recent Labs Lab 07/01/16 0246 07/02/16 0431 07/03/16 0225 07/04/16 0454 07/05/16 0319  WBC 5.6 6.7 7.3 7.9 7.2  HGB 11.2* 10.7* 10.7* 10.2* 9.9*  HCT 36.3* 34.1* 34.7* 32.0* 31.4*  MCV 96.8 95.5 97.2 95.0 94.9  PLT 229 229 240 211 234   Cardiac Enzymes:  Recent Labs Lab 06/28/16 1216 06/28/16 1828 06/29/16 1501  TROPONINI 0.26* 0.32* 0.16*   CBG:  Recent Labs Lab 07/04/16 0736 07/04/16 1144 07/04/16 1552 07/04/16 2113 07/05/16 0718  GLUCAP 81 148* 225* 249* 235*    Iron Studies: No results for input(s): IRON, TIBC, TRANSFERRIN, FERRITIN in the last 72 hours. Studies/Results: No results found. Medications: Infusions: . heparin 1,200 Units/hr (07/05/16 2952)    Scheduled Medications: . aspirin EC  81 mg Oral q morning - 10a  . carvedilol  6.25 mg Oral BID WC  . darbepoetin (ARANESP) injection - DIALYSIS  60 mcg Intravenous Q Mon-HD  . doxercalciferol  5 mcg Intravenous Q M,W,F-HD  . ferric citrate  420 mg Oral TID WC  . hydrALAZINE  25 mg Oral Q8H  . insulin aspart  0-9 Units Subcutaneous TID WC  . insulin detemir  13 Units Subcutaneous BID  . isosorbide mononitrate  30 mg Oral Daily  . polyethylene glycol  17 g Oral Daily  . pravastatin  40 mg Oral q1800  . pregabalin  75 mg Oral Daily  . QUEtiapine  25 mg Oral Daily  . tamsulosin  0.4 mg Oral Daily  . warfarin  7.5 mg Oral ONCE-1800  . Warfarin - Pharmacist Dosing Inpatient   Does not apply q1800    have reviewed scheduled and prn medications.  Physical Exam: General:NAD Heart:RRR- seems NS Lungs:clear Abdomen: soft,non tender Extremities:bilat amps L AK/ R BKA- no obvious edema Dialysis Access: left AVF +bruit  07/05/2016,10:03 AM  LOS: 5 days

## 2016-07-05 NOTE — Progress Notes (Signed)
ANTICOAGULATION CONSULT NOTE - Follow Up Consult  Pharmacy Consult for Warfarin with Heparin Bridge Indication: atrial fibrillation  Assessment: Heparin and Coumadin begin on 6/19 for new onset atrial fibrillation. Patients is also ESRD on HD. CHA2DS2/VASc at least 6 (age, HTN, DM, CHF, PAD).   Heparin level is therapeutic   Goal of Therapy:  INR 2-3 Heparin level 0.3-0.7 units/ml Monitor platelets by anticoagulation protocol: Yes   Plan:  Continue heparin drip at 1100 units/hr. Daily heparin level, CBC and PT/INR. Aim for lower end of therapeutic ranges for heparin and warfarin dosing. Monitor for any bleeding.   Allergies  Allergen Reactions  . Oxycodone Other (See Comments)    Hallucinations    Patient Measurements: Height: 5\' 8"  (172.7 cm) Weight: 172 lb 9.9 oz (78.3 kg) IBW/kg (Calculated) : 68.4 Heparin Dosing Weight: 73 kg  Vital Signs: Temp: 98.1 F (36.7 C) (06/25 1155) Temp Source: Oral (06/25 1155) BP: 144/72 (06/25 1155) Pulse Rate: 71 (06/25 1155)  Labs:  Recent Labs  07/03/16 0225  07/04/16 0454 07/05/16 0319 07/05/16 1751  HGB 10.7*  --  10.2* 9.9*  --   HCT 34.7*  --  32.0* 31.4*  --   PLT 240  --  211 234  --   LABPROT 14.3  --  16.6* 20.6*  --   INR 1.10  --  1.33 1.74  --   HEPARINUNFRC  --   < > 0.33 0.25* 0.40  < > = values in this interval not displayed.  Estimated Creatinine Clearance: 9.9 mL/min (A) (by C-G formula based on SCr of 6.15 mg/dL (H)).  Levester Fresh, PharmD, BCPS, BCCCP Clinical Pharmacist 07/05/2016 7:01 PM

## 2016-07-05 NOTE — Progress Notes (Signed)
Inpatient Diabetes Program Recommendations  AACE/ADA: New Consensus Statement on Inpatient Glycemic Control (2015)  Target Ranges:  Prepandial:   less than 140 mg/dL      Peak postprandial:   less than 180 mg/dL (1-2 hours)      Critically ill patients:  140 - 180 mg/dL   Lab Results  Component Value Date   GLUCAP 224 (H) 07/05/2016   HGBA1C 10.7 (H) 04/11/2016    Review of Glycemic Control Results for LARENZ, FRASIER (MRN [id number]) as of 07/05/2016 14:08  Ref. Range 07/04/2016 11:44 07/04/2016 15:52 07/04/2016 21:13 07/05/2016 07:18 07/05/2016 11:53  Glucose-Capillary Latest Ref Range: 65 - 99 mg/dL 148 (H) 225 (H) 249 (H) 235 (H) 224 (H)   Inpatient Diabetes Program Recommendations: Please consider restarting Novolog meal coverage 2 units tid if eats 50%.  Thank you, Nani Gasser. Christop Hippert, RN, MSN, CDE  Diabetes Coordinator Inpatient Glycemic Control Team Team Pager [phone] (8am-5pm) 07/05/2016 2:10 PM

## 2016-07-05 NOTE — Progress Notes (Signed)
ANTICOAGULATION CONSULT NOTE - Follow Up Consult  Pharmacy Consult for Warfarin with Heparin Bridge Indication: atrial fibrillation  Assessment: Heparin and Coumadin begin on 6/19 for new onset atrial fibrillation. Patients is also ESRD on HD. CHA2DS2/VASc at least 6 (age, HTN, DM, CHF, PAD).   Heparin level is therapeutic at 0.25 on 1100 units/hr. Hemoglobin is low/stable at 9.9. No bleeding noted per chart. INR 1.33 > 1.74, slowly trending up.  Goal of Therapy:  INR 2-3 Heparin level 0.3-0.7 units/ml Monitor platelets by anticoagulation protocol: Yes   Plan:  Continue heparin drip at 1100 units/hr. Warfarin 7.5mg  x1 today Daily heparin level, CBC and PT/INR. Aim for lower end of therapeutic ranges for heparin and warfarin dosing. Monitor for any bleeding.   Allergies  Allergen Reactions  . Oxycodone Other (See Comments)    Hallucinations    Patient Measurements: Height: 5\' 8"  (172.7 cm) Weight: 172 lb 9.9 oz (78.3 kg) IBW/kg (Calculated) : 68.4 Heparin Dosing Weight: 73 kg  Vital Signs: Temp: 97.5 F (36.4 C) (06/25 0528) Temp Source: Oral (06/25 0528) BP: 156/55 (06/25 0528) Pulse Rate: 68 (06/25 0528)  Labs:  Recent Labs  07/03/16 0225  07/03/16 1433 07/04/16 0454 07/05/16 0319  HGB 10.7*  --   --  10.2* 9.9*  HCT 34.7*  --   --  32.0* 31.4*  PLT 240  --   --  211 234  LABPROT 14.3  --   --  16.6* 20.6*  INR 1.10  --   --  1.33 1.74  HEPARINUNFRC  --   < > 0.31 0.33 0.25*  < > = values in this interval not displayed.  Estimated Creatinine Clearance: 9.9 mL/min (A) (by C-G formula based on SCr of 6.15 mg/dL (H)).  Maryanna Shape, PharmD, BCPS  Clinical Pharmacist  Pager: [phone]   07/05/2016 9:43 AM

## 2016-07-06 LAB — CBC
HCT: 33.3 % — ABNORMAL LOW (ref 39.0–52.0)
Hemoglobin: 10.4 g/dL — ABNORMAL LOW (ref 13.0–17.0)
MCH: 30 pg (ref 26.0–34.0)
MCHC: 31.2 g/dL (ref 30.0–36.0)
MCV: 96 fL (ref 78.0–100.0)
PLATELETS: 239 10*3/uL (ref 150–400)
RBC: 3.47 MIL/uL — ABNORMAL LOW (ref 4.22–5.81)
RDW: 15.6 % — AB (ref 11.5–15.5)
WBC: 7.1 10*3/uL (ref 4.0–10.5)

## 2016-07-06 LAB — GLUCOSE, CAPILLARY
GLUCOSE-CAPILLARY: 139 mg/dL — AB (ref 65–99)
Glucose-Capillary: 199 mg/dL — ABNORMAL HIGH (ref 65–99)

## 2016-07-06 LAB — PROTIME-INR
INR: 1.9
PROTHROMBIN TIME: 22 s — AB (ref 11.4–15.2)

## 2016-07-06 LAB — HEPARIN LEVEL (UNFRACTIONATED): HEPARIN UNFRACTIONATED: 0.3 [IU]/mL (ref 0.30–0.70)

## 2016-07-06 MED ORDER — INSULIN DETEMIR 100 UNIT/ML ~~LOC~~ SOLN: 13.0000 [IU] | Freq: Two times a day (BID) | SUBCUTANEOUS | 11 refills | Status: DC

## 2016-07-06 MED ORDER — INSULIN ASPART 100 UNIT/ML ~~LOC~~ SOLN: SUBCUTANEOUS | 11 refills | Status: DC

## 2016-07-06 MED ORDER — WARFARIN SODIUM 7.5 MG PO TABS: 7.5000 mg | ORAL_TABLET | Freq: Once | ORAL | Status: DC

## 2016-07-06 MED ORDER — WARFARIN SODIUM 7.5 MG PO TABS: 7.5000 mg | ORAL_TABLET | Freq: Every day | ORAL | 0 refills | Status: AC

## 2016-07-06 MED ORDER — CARVEDILOL 6.25 MG PO TABS: 6.2500 mg | ORAL_TABLET | Freq: Two times a day (BID) | ORAL | 0 refills | Status: AC

## 2016-07-06 NOTE — Progress Notes (Signed)
Pt has orders to be discharged. Discharge instructions given and pt has no additional questions at this time. Medication regimen reviewed and pt educated. Pt verbalized understanding and has no additional questions. Telemetry box removed. IV removed and site in good condition. Pt stable and waiting for transportation. 

## 2016-07-06 NOTE — Progress Notes (Signed)
Subjective:    INR up 1.9  Objective Vital signs in last 24 hours: Vitals:   07/05/16 2222 07/06/16 0001 07/06/16 0553 07/06/16 0815  BP: (!) 125/54 (!) 140/59 (!) 133/56 (!) 131/51  Pulse: 69  72 70  Resp: 18  18 18   Temp: 97.9 F (36.6 C)  98.5 F (36.9 C) 98.8 F (37.1 C)  TempSrc: Oral  Oral Oral  SpO2: 95%  97% 96%  Weight: 74.5 kg (164 lb 3.9 oz)  76 kg (167 lb 8.8 oz)   Height:       Weight change: 0.2 kg (7.1 oz)  Intake/Output Summary (Last 24 hours) at 07/06/16 1255 Last data filed at 07/06/16 1003  Gross per 24 hour  Intake            397.2 ml  Output             4000 ml  Net          -3602.8 ml   [place] MWF 4h 72.5kg Hep 2200 LUA AVF Hectoral  IV/HD Venofer 50 mg qwkly hd    Summary: Pt is a 76 y.o. yo male with ESRD who was admitted on 06/28/2016 with SOB- found to have volume overload- hosp c/p new onset a fib and hypoglycemia    Assess/Plan: 1. SOB- due to volume vs afib- resolved.   2. ESRD - normally MWF at AF- via AVF- . Plan HD 1st shift tomorrow.   3. Anemia- hgb 10.2- no ESA here- given 6/1 and 6/4- 50 both times- probably due for more and in the setting of anticoagulation will do  4. Secondary hyperparathyroidism- auryxia ordered here, phos is good - says was on hect as OP but not ordered here- have ordered  5. HTN/volume- BP good- was on hydralazine 25 and coreg low dose as OP. Wt's creeped up, 4L off yesterday, 3.5kg up today 6. New onset a fib- new start to coumadin- awaiting INR over 2  Kelly Splinter MD [employer] pgr [phone]   07/06/2016, 12:55 PM     Labs: Basic Metabolic Panel:  Recent Labs Lab 06/30/16 1513 07/02/16 0727 07/05/16 1916  NA 131* 131* 124*  K 4.0 4.7 5.4*  CL 95* 94* 88*  CO2 24 26 26   GLUCOSE 235* 180* 199*  BUN 45* 37* 68*  CREATININE 6.69* 6.15* 8.67*  CALCIUM 9.1 9.2 9.3  PHOS 4.7* 4.1 5.0*   Liver Function Tests:  Recent Labs Lab 06/30/16 1513 07/02/16 0727  07/05/16 1916  ALBUMIN 2.9* 3.2* 3.3*   No results for input(s): LIPASE, AMYLASE in the last 168 hours. No results for input(s): AMMONIA in the last 168 hours. CBC:  Recent Labs Lab 07/03/16 0225 07/04/16 0454 07/05/16 0319 07/05/16 1915 07/06/16 0353  WBC 7.3 7.9 7.2 7.7 7.1  HGB 10.7* 10.2* 9.9* 10.0* 10.4*  HCT 34.7* 32.0* 31.4* 31.6* 33.3*  MCV 97.2 95.0 94.9 93.8 96.0  PLT 240 211 234 236 239   Cardiac Enzymes:  Recent Labs Lab 06/29/16 1501  TROPONINI 0.16*   CBG:  Recent Labs Lab 07/05/16 1153 07/05/16 1556 07/05/16 2326 07/06/16 0754 07/06/16 1145  GLUCAP 224* 173* 202* 139* 199*    Iron Studies: No results for input(s): IRON, TIBC, TRANSFERRIN, FERRITIN in the last 72 hours. Studies/Results: No results found. Medications: Infusions: . heparin Stopped (07/06/16 1226)    Scheduled Medications: . aspirin EC  81 mg Oral q morning - 10a  . carvedilol  6.25 mg Oral BID  WC  . darbepoetin (ARANESP) injection - DIALYSIS  60 mcg Intravenous Q Mon-HD  . doxercalciferol  5 mcg Intravenous Q M,W,F-HD  . ferric citrate  420 mg Oral TID WC  . hydrALAZINE  25 mg Oral Q8H  . insulin aspart  0-9 Units Subcutaneous TID WC  . insulin aspart  2 Units Subcutaneous TID WC  . insulin detemir  13 Units Subcutaneous BID  . isosorbide mononitrate  30 mg Oral Daily  . polyethylene glycol  17 g Oral Daily  . pravastatin  40 mg Oral q1800  . pregabalin  75 mg Oral Daily  . QUEtiapine  25 mg Oral Daily  . tamsulosin  0.4 mg Oral Daily  . warfarin  7.5 mg Oral ONCE-1800  . Warfarin - Pharmacist Dosing Inpatient   Does not apply q1800    have reviewed scheduled and prn medications.  Physical Exam: General:NAD Heart:RRR- seems NS Lungs:clear Abdomen: soft,non tender Extremities:bilat amps L AK/ R BKA- no obvious edema Dialysis Access: left AVF +bruit  07/06/2016,12:55 PM  LOS: 6 days

## 2016-07-06 NOTE — Progress Notes (Signed)
ANTICOAGULATION CONSULT NOTE - Follow Up Consult  Pharmacy Consult for Warfarin with Heparin Bridge Indication: atrial fibrillation  Assessment: Heparin and Coumadin begin on 6/19 for new onset atrial fibrillation. Patients is also ESRD on HD. CHA2DS2/VASc at least 6 (age, HTN, DM, CHF, PAD). Pending INR therapeutic for discharge.   Heparin level is therapeutic at 0.3 on 1200 units/hr. Hemoglobin is low/stable at 10.4. No bleeding noted per chart. INR 1.9, slowly trending up.  Goal of Therapy:  INR 2-3 Heparin level 0.3-0.7 units/ml Monitor platelets by anticoagulation protocol: Yes   Plan:  Continue heparin drip at 1100 units/hr. Warfarin 7.5mg  x1 today Recommend discharge home on coumadin 7.5 mg daily Daily heparin level, CBC and PT/INR. Aim for lower end of therapeutic ranges for heparin and warfarin dosing. Monitor for any bleeding.   Allergies  Allergen Reactions  . Oxycodone Other (See Comments)    Hallucinations    Patient Measurements: Height: 5\' 8"  (172.7 cm) Weight: 167 lb 8.8 oz (76 kg) IBW/kg (Calculated) : 68.4 Heparin Dosing Weight: 73 kg  Vital Signs: Temp: 98.8 F (37.1 C) (06/26 0815) Temp Source: Oral (06/26 0815) BP: 131/51 (06/26 0815) Pulse Rate: 70 (06/26 0815)  Labs:  Recent Labs  07/04/16 0454 07/05/16 0319 07/05/16 1751 07/05/16 1915 07/05/16 1916 07/06/16 0353  HGB 10.2* 9.9*  --  10.0*  --  10.4*  HCT 32.0* 31.4*  --  31.6*  --  33.3*  PLT 211 234  --  236  --  239  LABPROT 16.6* 20.6*  --   --   --  22.0*  INR 1.33 1.74  --   --   --  1.90  HEPARINUNFRC 0.33 0.25* 0.40  --   --  0.30  CREATININE  --   --   --   --  8.67*  --     Estimated Creatinine Clearance: 7 mL/min (A) (by C-G formula based on SCr of 8.67 mg/dL (H)).  Maryanna Shape, PharmD, BCPS  Clinical Pharmacist  Pager: [phone]   07/06/2016 9:52 AM

## 2016-07-07 DIAGNOSIS — D631 Anemia in chronic kidney disease: Secondary | ICD-10-CM | POA: Diagnosis not present

## 2016-07-07 DIAGNOSIS — E119 Type 2 diabetes mellitus without complications: Secondary | ICD-10-CM | POA: Diagnosis not present

## 2016-07-07 DIAGNOSIS — N186 End stage renal disease: Secondary | ICD-10-CM | POA: Diagnosis not present

## 2016-07-07 DIAGNOSIS — E161 Other hypoglycemia: Secondary | ICD-10-CM | POA: Diagnosis not present

## 2016-07-07 DIAGNOSIS — R739 Hyperglycemia, unspecified: Secondary | ICD-10-CM | POA: Diagnosis not present

## 2016-07-07 DIAGNOSIS — N2581 Secondary hyperparathyroidism of renal origin: Secondary | ICD-10-CM | POA: Diagnosis not present

## 2016-07-07 NOTE — Discharge Summary (Signed)
Physician Discharge Summary  Aaron Long:[id number] DOB: 11-Oct-1940 DOA: 06/28/2016  PCP: Patient, No Pcp Per  Admit date: 06/28/2016 Discharge date: 07/06/2016  Admitted From: Home. Disposition:  Home  Recommendations for Outpatient Follow-up:  1. Follow up with PCP in 1-2 weeks 2. Please obtain BMP on 6/27 .  3. Please follow up with coumadin clinic or PCP office to check iNR on Thursday. Keep it between 2 to 3.  4. Please follow up with cardiology and nephrology as needed.     Discharge Condition:stable CODE STATUS: full code.  Diet recommendation:renal diet.   Brief/Interim Summary: 76 year old male admitted for SOB at rest, which resolved with oxygen. He was also found to be in new onset a fib with RVR and hypoglycemia. He was started on IV heparin after consulting GI, currently on coumadin , awaiting for therapeutic INR. His INR on discharge is 1.9, recommended to check INR on Thursday either at PCP office or coumadin clinic.   Discharge Diagnoses:  Principal Problem:   Acute on chronic systolic and diastolic heart failure, NYHA class 4 (HCC) Active Problems:   HTN (hypertension)   CAD (coronary artery disease)   DM type 2, uncontrolled, with renal complications (HCC)   ESRD on dialysis [facility])   Pulmonary edema   Atrial fibrillation ([place])   New onset atrial fibrillation (HCC)  Acute on chronic systolic heart failure:  Fluid management as per HD. Daily weights,  Strict intake and output.  Reports feeling better. No new complaints.  He is off oxygen.      ESRD on HD:  Further management as per renal.    Type 2 DM: CBG (last 3)   Recent Labs (last 2 labs)    Recent Labs  07/05/16 0718 07/05/16 1153 07/05/16 1556  GLUCAP 235* 224* 173*      Resume SSI.  ON 13 units of Levemir.  hgba1c is 10.7 on 4//18, better than last year  Would continue with levemir 13 units BID.    Hypoglycemia: episodes resolved.   Peripheral  neuropathy:  Resume Lyrica.    BPH;  Resume flomax.   Constipation:  .resolved.   Hypertension: controlled.    New onset atrial fib : rate better controlled and in sinus.  Started on IV heparin and coumadin added on.  His [place] vasc is 6. INR almost therapeutic at 1.9. Resume coumadin 7.5 mg daily and check INR on Thursday and keep it between 2 to 3.   H/o CAD s/p CABG:  NO chest pain or sob.  Resume home meds.    Anemia of chronic disease:  Normocytic, slight drop possibly from being on IV heparin and coumadin.  Monitor hemoglobin in one week.     Discharge Instructions  Discharge Instructions    Diet - low sodium heart healthy    Complete by:  As directed    Discharge instructions    Complete by:  As directed    Resume HD M/W/F Renal carb mod diet   Discharge instructions    Complete by:  As directed    Please check INR on Thursday at PCP office or coumadin clinic. Keep your INR between 2 to 3.  Please follow up with PCP in one week.   Increase activity slowly    Complete by:  As directed      Allergies as of 07/06/2016      Reactions   Oxycodone Other (See Comments)   Hallucinations      Medication List  STOP taking these medications   calcium acetate 667 MG capsule Commonly known as:  PHOSLO   insulin aspart 100 UNIT/ML FlexPen Commonly known as:  NOVOLOG Replaced by:  insulin aspart 100 UNIT/ML injection     TAKE these medications   amLODipine 5 MG tablet Commonly known as:  NORVASC Take 1 tablet (5 mg total) by mouth daily. Take one tablet by mouth once daily   ASPIRIN LOW DOSE 81 MG EC tablet Generic drug:  aspirin TAKE 1 TABLET BY MOUTH EVERY MORNING What changed:  See the new instructions.   AURYXIA 1 GM 210 MG(Fe) tablet Generic drug:  ferric citrate Take 420 mg by mouth 3 (three) times daily.   carvedilol 6.25 MG tablet Commonly known as:  COREG Take 1 tablet (6.25 mg total) by mouth 2 (two) times daily with a  meal. What changed:  medication strength  how much to take   clopidogrel 75 MG tablet Commonly known as:  PLAVIX Take one tablet by mouth once daily What changed:  how much to take  how to take this  when to take this  additional instructions   hydrALAZINE 25 MG tablet Commonly known as:  APRESOLINE Take 1 tablet (25 mg total) by mouth every 8 (eight) hours.   HYDROcodone-acetaminophen 5-325 MG tablet Commonly known as:  NORCO/VICODIN Take 1 tablet by mouth every 6 (six) hours as needed for moderate pain. What changed:  when to take this   insulin aspart 100 UNIT/ML injection Commonly known as:  novoLOG CBG 70 - 120: 0 units CBG 121 - 150: 1 unit CBG 151 - 200: 2 units CBG 201 - 250: 3 units CBG 251 - 300: 5 units CBG 301 - 350: 7 units CBG 351 - 400: 9 units Replaces:  insulin aspart 100 UNIT/ML FlexPen   insulin detemir 100 UNIT/ML injection Commonly known as:  LEVEMIR Inject 0.13 mLs (13 Units total) into the skin 2 (two) times daily. What changed:  how much to take  how to take this  when to take this  additional instructions   isosorbide mononitrate 30 MG 24 hr tablet Commonly known as:  IMDUR Take 1 tablet (30 mg total) by mouth daily.   polyethylene glycol packet Commonly known as:  MIRALAX Take 17 g by mouth daily.   pravastatin 40 MG tablet Commonly known as:  PRAVACHOL Take one tablet by mouth once daily What changed:  how much to take  how to take this  when to take this  additional instructions   pregabalin 75 MG capsule Commonly known as:  LYRICA Take one tablet by mouth once daily for pains What changed:  how much to take  how to take this  when to take this  additional instructions   QUEtiapine 25 MG tablet Commonly known as:  SEROQUEL Take 1 tablet (25 mg total) by mouth daily.   tamsulosin 0.4 MG Caps capsule Commonly known as:  FLOMAX Take one capsule by mouth once daily What changed:  how much to take  how  to take this  when to take this  additional instructions   warfarin 7.5 MG tablet Commonly known as:  COUMADIN Take 1 tablet (7.5 mg total) by mouth daily at 6 PM.      Follow-up Information    Bartholome Bill, MD Follow up on 07/12/2016.   Specialty:  Family Medicine Why:  At 9:50 AM.  Contact information: [place] [address] [phone]  Burnell Blanks, MD Follow up on 07/09/2016.   Specialty:  Cardiology Why:  Please go there for your INR/ blood work to be checked/ Coumadin Clinic at 2 pm. Please keep your apt Contact information: [place] [place] [address] [phone]          Allergies  Allergen Reactions  . Oxycodone Other (See Comments)    Hallucinations    Consultations:  Cardiology  Nephrology     Procedures/Studies: Dg Chest 2 View  Result Date: 06/28/2016 CLINICAL DATA:  Shortness of breath tonight. EXAM: CHEST  2 VIEW COMPARISON:  Radiographs 330 and 04/10/2016 FINDINGS: Post median sternotomy. Stable heart size and mediastinal contours with atherosclerosis of the thoracic aorta. Increased pulmonary edema from prior exam. Streaky bibasilar atelectasis or scarring. Minimal fluid in the fissures and small pleural effusions. No pneumothorax or confluent airspace disease. Vascular stent overlies the left subclavian region. IMPRESSION: 1. Pulmonary edema and probable small pleural effusions, suspicious for CHF. 2. Thoracic aortic atherosclerosis. 3. Bibasilar atelectasis or scarring. Electronically Signed   By: Jeb Levering M.D.   On: 06/28/2016 01:38   Dg Abd 2 Views  Result Date: 06/28/2016 CLINICAL DATA:  Constipation. EXAM: ABDOMEN - 2 VIEW COMPARISON:  Abdominal radiographs 06/18/2012 FINDINGS: No evidence of free air. No bowel dilatation to suggest obstruction. Moderate stool throughout the colon with small to moderate rectal stool burden. Multiple surgical clips in the pelvis.  There are vascular calcifications. Osseous structures are grossly intact. IMPRESSION: Moderate stool burden throughout the colon without evidence of obstruction or free air. Electronically Signed   By: Jeb Levering M.D.   On: 06/28/2016 01:39       Subjective: No chest pain or sob.   Discharge Exam: Vitals:   07/06/16 0553 07/06/16 0815  BP: (!) 133/56 (!) 131/51  Pulse: 72 70  Resp: 18 18  Temp: 98.5 F (36.9 C) 98.8 F (37.1 C)   Vitals:   07/05/16 2222 07/06/16 0001 07/06/16 0553 07/06/16 0815  BP: (!) 125/54 (!) 140/59 (!) 133/56 (!) 131/51  Pulse: 69  72 70  Resp: 18  18 18   Temp: 97.9 F (36.6 C)  98.5 F (36.9 C) 98.8 F (37.1 C)  TempSrc: Oral  Oral Oral  SpO2: 95%  97% 96%  Weight: 74.5 kg (164 lb 3.9 oz)  76 kg (167 lb 8.8 oz)   Height:        General: Pt is alert, awake, not in acute distress Cardiovascular: RRR, S1/S2 +, no rubs, no gallops Respiratory: CTA bilaterally, no wheezing, no rhonchi Abdominal: Soft, NT, ND, bowel sounds + Extremities: no edema, no cyanosis    The results of significant diagnostics from this hospitalization (including imaging, microbiology, ancillary and laboratory) are listed below for reference.     Microbiology: Recent Results (from the past 240 hour(s))  MRSA PCR Screening     Status: None   Collection Time: 06/28/16 11:49 PM  Result Value Ref Range Status   MRSA by PCR NEGATIVE NEGATIVE Final    Comment:        The GeneXpert MRSA Assay (FDA approved for NASAL specimens only), is one component of a comprehensive MRSA colonization surveillance program. It is not intended to diagnose MRSA infection nor to guide or monitor treatment for MRSA infections.      Labs: BNP (last 3 results)  Recent Labs  06/28/16 0146  BNP >0,102.7*   Basic Metabolic Panel:  Recent Labs Lab 06/30/16 1513 07/02/16 0727  07/05/16 1916  NA 131* 131* 124*  K 4.0 4.7 5.4*  CL 95* 94* 88*  CO2 24 26 26   GLUCOSE 235* 180*  199*  BUN 45* 37* 68*  CREATININE 6.69* 6.15* 8.67*  CALCIUM 9.1 9.2 9.3  PHOS 4.7* 4.1 5.0*   Liver Function Tests:  Recent Labs Lab 06/30/16 1513 07/02/16 0727 07/05/16 1916  ALBUMIN 2.9* 3.2* 3.3*   No results for input(s): LIPASE, AMYLASE in the last 168 hours. No results for input(s): AMMONIA in the last 168 hours. CBC:  Recent Labs Lab 07/03/16 0225 07/04/16 0454 07/05/16 0319 07/05/16 1915 07/06/16 0353  WBC 7.3 7.9 7.2 7.7 7.1  HGB 10.7* 10.2* 9.9* 10.0* 10.4*  HCT 34.7* 32.0* 31.4* 31.6* 33.3*  MCV 97.2 95.0 94.9 93.8 96.0  PLT 240 211 234 236 239   Cardiac Enzymes: No results for input(s): CKTOTAL, CKMB, CKMBINDEX, TROPONINI in the last 168 hours. BNP: Invalid input(s): POCBNP CBG:  Recent Labs Lab 07/05/16 1153 07/05/16 1556 07/05/16 2326 07/06/16 0754 07/06/16 1145  GLUCAP 224* 173* 202* 139* 199*   D-Dimer No results for input(s): DDIMER in the last 72 hours. Hgb A1c No results for input(s): HGBA1C in the last 72 hours. Lipid Profile No results for input(s): CHOL, HDL, LDLCALC, TRIG, CHOLHDL, LDLDIRECT in the last 72 hours. Thyroid function studies No results for input(s): TSH, T4TOTAL, T3FREE, THYROIDAB in the last 72 hours.  Invalid input(s): FREET3 Anemia work up No results for input(s): VITAMINB12, FOLATE, FERRITIN, TIBC, IRON, RETICCTPCT in the last 72 hours. Urinalysis    Component Value Date/Time   COLORURINE YELLOW 10/15/2014 1344   APPEARANCEUR CLEAR 10/15/2014 1344   APPEARANCEUR Turbid (A) 07/12/2014 1644   LABSPEC 1.017 10/15/2014 1344   PHURINE 6.0 10/15/2014 1344   GLUCOSEU >1000 (A) 10/15/2014 1344   HGBUR SMALL (A) 10/15/2014 1344   BILIRUBINUR NEGATIVE 10/15/2014 1344   BILIRUBINUR Negative 07/12/2014 [place] 10/15/2014 1344   PROTEINUR >300 (A) 10/15/2014 1344   UROBILINOGEN 0.2 10/15/2014 1344   NITRITE NEGATIVE 10/15/2014 1344   LEUKOCYTESUR NEGATIVE 10/15/2014 1344   LEUKOCYTESUR 3+ (A)  07/12/2014 1644   Sepsis Labs Invalid input(s): PROCALCITONIN,  WBC,  LACTICIDVEN Microbiology Recent Results (from the past 240 hour(s))  MRSA PCR Screening     Status: None   Collection Time: 06/28/16 11:49 PM  Result Value Ref Range Status   MRSA by PCR NEGATIVE NEGATIVE Final    Comment:        The GeneXpert MRSA Assay (FDA approved for NASAL specimens only), is one component of a comprehensive MRSA colonization surveillance program. It is not intended to diagnose MRSA infection nor to guide or monitor treatment for MRSA infections.      Time coordinating discharge: Over 30 minutes  SIGNED:   [name], MD  [facility] 07/07/2016, 9:29 AM Pager   If 7PM-7AM, please contact night-coverage [email or web address] Password [email or web address]

## 2016-07-08 NOTE — Progress Notes (Signed)
Cardiology Office Note:    Date:  07/09/2016   ID:  Aaron Long, DOB March 10, 1940, MRN [id number]  PCP:  Bartholome Bill, MD  Cardiologist:  Dr. Angelena Form  Referring MD: Fay Records, MD   Chief Complaint  Patient presents with  . Hospitalization Follow-up    PAF    History of Present Illness:    Aaron Long is a 76 y.o. male with a hx of MI 2005, CABG 2007, patent grafts at 04/2016, CHF wit EF 35-40% 04/2016, ESRD with HD on MWF, type 2 DM, PAD s/p bilateral BKA and bilateral carotid endarterectomy who was recently hospitalized  Fluid overload 06/28/16 with PAF and for CHF in 04/2016. He is here today with his wife.   In 04/2016 when admitted for CHF he had cardiac cath that showed patent grafts, 80% stenosis of the grafted OM proximal to SVG anastamosis which backfills the distal Cx and moderately elevated LVEDP (30 mm Hg). Hydralazine and long acting NTG were added to the medical regimen. Fluid removal per HD.   Pt was admitted to the hospital 6/18-6/26/18 for shortness of breath/fluid overload and noted to have brief self limiting episodes of atrial fib with diaphoresis and hypotension. His CHA2DS2/VAS Stroke Risk Score is at least 6 (CHF, vascular Dz, HTN, Age (2), DM) and he was started on coumadin. Considering a history of bleeding during dialysis session on aspirin and Plavix and hx of Duelefoy lesions in 2017, Plavix was discontinued. He needs close monitoring for bleeding. Carvedilol was increased to 6.25 mg for control of afib and amlodiine discontinued. May consider further up titration as needed or addition of antiarrhythmic. He was in sinus rhythm on discharge.   He was breathing better within one day of admission. Upon discharge he was breathing fine, but washed out and had no energy. He is feeling back to normal today. He denies chest discomfort, shortness of breath, palpitations, lightheadedness, orthopnea, PND or edema.     He has been on coumadin in the past  about 10 years ago and his wife is well versed in the regimen. They had no arrangements for following coumadin. While here in office wife is calling to arrange coumadin following at PCP as that office is closer to home than this office. The patient has had no unusual bleeding.     Carotid dopplers 10/30/15 patent right carotid endarterectomy site with no stenosis of the right internal carotid artery but diffuse plaque, patent left carotid endarterectomy site. Has follow up carotid dopplers scheduled for 10/2016 followed by Dr Oneida Alar. Pt had intermediate risk nuclear stress test on 10/03/2014   Past Medical History:  Diagnosis Date  . Abnormality of gait   . Anemia   . BPH (benign prostatic hypertrophy)   . CHF (congestive heart failure) ([place])   . Complication of anesthesia    "he gets delirious"  . Constipation    takes Miralax daily  . Coronary artery disease    a. s/p NSTEMI/CABGx4 in 2007 - LIMA-LAD, SVG-optional diagonal, SVVG-OM, SVG-dRCA. b. Nuc 05/2011: nonischemic.  . Diabetic peripheral neuropathy ([place])   . Dieulafoy lesion of rectum 05/03/2015  . Disorder of bone and cartilage, unspecified   . DVT (deep venous thrombosis) ([place])    a. Lower extremity DVT in 2013.  [name] Dyspnea   . ESRD on dialysis [facility])    M-W-F  . Gangrene of toe (HCC)    dry  . Hemorrhoids   . Hx of colonic polyps   .  Hyperlipidemia   . Hypertension   . Hypertrophy of prostate without urinary obstruction and other lower urinary tract symptoms (LUTS)   . Ischemic cardiomyopathy    a. EF 40% in 2007. b. 51% by nuc 05/2011.  [name] Kidney stones   . Lower limb amputation, below knee   . Myocardial infarction ([place]) 2005  . Neuromuscular disorder ([place])    diabetic neuropathy  . NSTEMI (non-ST elevated myocardial infarction) ([place]) 04/2016  . PAD (peripheral artery disease) ([place])    a. R CEA 2007. b. Hx L BKA in 2013. c. s/p LE angioplasty in 2014. d. Hx R BKA in 2014.  [name] Peripheral neuropathy   . Prostate cancer  ([place]) 2010  . Secondary hyperparathyroidism ([place])    Secondary Hyperpara- Thyroidism, Renal  . Type II diabetes mellitus ([place])     Past Surgical History:  Procedure Laterality Date  . ABDOMINAL AORTAGRAM N/A 05/28/2011   Procedure: ABDOMINAL Maxcine Ham;  Surgeon: Elam Dutch, MD;  Location: [facility] CATH LAB;  Service: Cardiovascular;  Laterality: N/A;  . ABDOMINAL AORTAGRAM N/A 06/02/2012   Procedure: ABDOMINAL Maxcine Ham;  Surgeon: Elam Dutch, MD;  Location: [facility] CATH LAB;  Service: Cardiovascular;  Laterality: N/A;  . ABDOMINAL AORTAGRAM N/A 06/09/2012   Procedure: ABDOMINAL Maxcine Ham;  Surgeon: Elam Dutch, MD;  Location: [facility] CATH LAB;  Service: Cardiovascular;  Laterality: N/A;  . AMPUTATION  06/14/2011   Procedure: AMPUTATION DIGIT;  Surgeon: Elam Dutch, MD;  Location: [facility] OR;  Service: Vascular;  Laterality: Left;  Amputation Left fifth toe  . AMPUTATION  06/16/2011   Procedure: AMPUTATION BELOW KNEE;  Surgeon: Elam Dutch, MD;  Location: [place];  Service: Vascular;  Laterality: Left;  . AMPUTATION Right 06/12/2012   Procedure: AMPUTATION DIGIT;  Surgeon: Elam Dutch, MD;  Location: [place];  Service: Vascular;  Laterality: Right;  GREAT TOE  . AMPUTATION Right 08/08/2012   Procedure: AMPUTATION BELOW KNEE;  Surgeon: Elam Dutch, MD;  Location: [place];  Service: Vascular;  Laterality: Right;  . AV FISTULA PLACEMENT Right 04/19/2014   Procedure: RIGHT ARM ARTERIOVENOUS (AV) FISTULA CREATION;  Surgeon: Mal Misty, MD;  Location: [place];  Service: Vascular;  Laterality: Right;  . AV FISTULA PLACEMENT Left 12/12/2014   Procedure: BRACHIOCEPHALIC ARTERIOVENOUS (AV) FISTULA CREATION;  Surgeon: Mal Misty, MD;  Location: [place];  Service: Vascular;  Laterality: Left;  . CARDIAC CATHETERIZATION  05/28/11  . CARDIAC CATHETERIZATION N/A 10/04/2014   Procedure: Left Heart Cath and Coronary Angiography;  Surgeon: Sherren Mocha, MD;  Location: [place] CV LAB;  Service:  Cardiovascular;  Laterality: N/A;  . CAROTID ENDARTERECTOMY Right 2005  . CATARACT EXTRACTION W/ INTRAOCULAR LENS  IMPLANT, BILATERAL Bilateral 2011  . COLONOSCOPY    . COLONOSCOPY N/A 05/03/2015   Procedure: COLONOSCOPY;  Surgeon: Gatha Mayer, MD;  Location: [place];  Service: Endoscopy;  Laterality: N/A;  . CORONARY ARTERY BYPASS GRAFT  2005   CABG X4  . ENDARTERECTOMY Left 10/17/2014   Procedure: ENDARTERECTOMY CAROTID;  Surgeon: Mal Misty, MD;  Location: [place];  Service: Vascular;  Laterality: Left;  . I&D EXTREMITY  01/31/2012   Procedure: IRRIGATION AND DEBRIDEMENT EXTREMITY;  Surgeon: Elam Dutch, MD;  Location: [place];  Service: Vascular;  Laterality: Left;  I & D Left BKA   . INSERTION OF DIALYSIS CATHETER Right 04/19/2014   Procedure: INSERTION OF DIALYSIS CATHETER-INTERNAL JUGULAR;  Surgeon: Mal Misty, MD;  Location: [place];  Service: Vascular;  Laterality: Right;  . INSERTION OF DIALYSIS CATHETER N/A 11/06/2014   Procedure: INSERTION OF 23cm DIALYSIS CATHETER - right internal jugular;  Surgeon: Mal Misty, MD;  Location: [place];  Service: Vascular;  Laterality: N/A;  . KNEE CARTILAGE SURGERY Left 1964  . LEFT HEART CATH AND CORONARY ANGIOGRAPHY N/A 04/12/2016   Procedure: Left Heart Cath and Coronary Angiography;  Surgeon: Nelva Bush, MD;  Location: [place] CV LAB;  Service: Cardiovascular;  Laterality: N/A;  . LIGATION OF ARTERIOVENOUS  FISTULA Right 11/06/2014   Procedure: LIGATION OF RADIOCEPHALIC ARTERIOVENOUS FISTULA;  Surgeon: Mal Misty, MD;  Location: [place];  Service: Vascular;  Laterality: Right;  . LIGATION OF COMPETING BRANCHES OF ARTERIOVENOUS FISTULA Right 06/19/2014   Procedure: Right Arm LIGATION OF COMPETING BRANCHES OF RADIOCEPHALIC ARTERIOVENOUS FISTULA;  Surgeon: Mal Misty, MD;  Location: [place];  Service: Vascular;  Laterality: Right;  . PROSTATECTOMY  2009    Current Medications: Current Meds  Medication Sig  . amLODipine  (NORVASC) 5 MG tablet Take 1 tablet (5 mg total) by mouth daily. Take one tablet by mouth once daily  . ASPIRIN LOW DOSE 81 MG EC tablet TAKE 1 TABLET BY MOUTH EVERY MORNING (Patient taking differently: TAKE 81 MG BY MOUTH EVERY MORNING)  . AURYXIA 1 GM 210 MG(Fe) tablet Take 420 mg by mouth 3 (three) times daily.  . carvedilol (COREG) 6.25 MG tablet Take 1 tablet (6.25 mg total) by mouth 2 (two) times daily with a meal.  . clopidogrel (PLAVIX) 75 MG tablet Take one tablet by mouth once daily (Patient taking differently: Take 75 mg by mouth daily. )  . hydrALAZINE (APRESOLINE) 25 MG tablet Take 1 tablet (25 mg total) by mouth every 8 (eight) hours.  [name] HYDROcodone-acetaminophen (NORCO/VICODIN) 5-325 MG tablet Take 1 tablet by mouth every 6 (six) hours as needed for moderate pain. (Patient taking differently: Take 1 tablet by mouth every 8 (eight) hours as needed for moderate pain. )  . isosorbide mononitrate (IMDUR) 30 MG 24 hr tablet Take 1 tablet (30 mg total) by mouth daily.  [name] LEVEMIR FLEXTOUCH 100 UNIT/ML Pen Inject 13 Units as directed 2 (two) times a week.  [name] NOVOLOG FLEXPEN 100 UNIT/ML FlexPen CBG 70 - 120: 0 units  CBG 121 - 150: 1 unit  CBG 151 - 200: 2 units  CBG 201 - 250: 3 units  CBG 251 - 300: 5 units  CBG 301 - 350: 7 units  CBG 351 - 400: 9 units  . polyethylene glycol (MIRALAX) packet Take 17 g by mouth daily.  . pravastatin (PRAVACHOL) 40 MG tablet Take one tablet by mouth once daily (Patient taking differently: Take 40 mg by mouth daily. )  . pregabalin (LYRICA) 75 MG capsule Take one tablet by mouth once daily for pains (Patient taking differently: Take 75 mg by mouth daily. )  . QUEtiapine (SEROQUEL) 25 MG tablet Take 1 tablet (25 mg total) by mouth daily.  . tamsulosin (FLOMAX) 0.4 MG CAPS capsule Take one capsule by mouth once daily (Patient taking differently: Take 0.4 mg by mouth daily. )  . warfarin (COUMADIN) 7.5 MG tablet Take 1 tablet (7.5 mg total) by mouth daily at  6 PM.     Allergies:   Oxycodone   Social History   Social History  . Marital status: Married    Spouse name: N/A  . Number of children: 3  . Years of education: N/A   Occupational History  .  Retired-[employer]    Social History Main Topics  . Smoking status: Former Smoker    Packs/day: 1.00    Years: 30.00    Types: Cigarettes    Quit date: 04/28/1991  . Smokeless tobacco: Never Used  . Alcohol use Yes     Comment: 04/17/2014 "might have a glass of wine at Christmas"  . Drug use: No  . Sexual activity: No   Other Topics Concern  . None   Social History Narrative  . None     Family History: [name] family history includes Alzheimer's disease in his sister; Cancer in his brother and mother; Heart disease in his sister; Hyperlipidemia in his father and mother; Hypertension in his father and mother; Kidney disease in his father; Other in his sister. There is no history of Coronary artery disease, Anesthesia problems, Hypotension, Malignant hyperthermia, or Pseudochol deficiency. ROS:   Please see the history of present illness.    All other systems reviewed and are negative.  EKGs/Labs/Other Studies Reviewed:    The following studies were reviewed today:  Echocardiogram 04/11/16 Study Conclusions  - Left ventricle: Septal and posterior lateral wall hypokinesis The cavity size was mildly dilated. There was mild focal basal hypertrophy of the septum. Systolic function was moderately reduced. The estimated ejection fraction was in the range of 35% to 40%. Doppler parameters are consistent with abnormal left ventricular relaxation (grade 1 diastolic dysfunction). - Aortic valve: Likely trileaflet but not well seen - Atrial septum: No defect or patent foramen ovale was identified. ________________________________________________________  Left heart catheterization 04/12/16 Conclusions: 1. Severe native coronary artery disease, including chronic  total occlusions of the proximal LAD, mid LAD, ostial LCx, and mid RCA. 2. All bypass grafts widely patent (LIMA->LAD, SVG->diagonal, SVG->OM, and SVG->PDA). There is 80% stenosis involving the grafted OM proximal to SVG anastomosis, which backfills the distal LCx. 3. Moderately elevated LVEDP (30 mmHg). 4. Severely reduced LV contraction (LVEF ~25%).  Conclusions: 1. Aggressive medical therapy, including additional fluid removal by hemodialysis. 2. Consider addition of beta blocker and/or long-acting nitrate (in addition to other evidence-based heart failure therapies).   EKG:  EKG is ordered today.  The ekg ordered today demonstrates NSR at 72 bpm with LVH and repolarization abnormality. Non-specific T wave changes related to LVH unchanged are chronic.   Recent Labs: 06/28/2016: B Natriuretic Peptide >4,500.0 07/05/2016: BUN 68; Creatinine, Ser 8.67; Potassium 5.4; Sodium 124 07/06/2016: Hemoglobin 10.4; Platelets 239   Recent Lipid Panel    Component Value Date/Time   CHOL 237 (H) 04/10/2015 1535   TRIG 228 (H) 04/10/2015 1535   HDL 34 (L) 04/10/2015 1535   CHOLHDL 7.0 (H) 04/10/2015 1535   CHOLHDL 6.1 10/01/2014 0148   VLDL 32 10/01/2014 0148   LDLCALC 157 (H) 04/10/2015 1535    Physical Exam:    VS:  BP 118/62   Pulse 72   Ht 5\' 8"  (1.727 m)     Wt Readings from Last 3 Encounters:  07/06/16 167 lb 8.8 oz (76 kg)  04/27/16 159 lb (72.1 kg)  04/13/16 160 lb 11.5 oz (72.9 kg)     GEN:  Well nourished, chronically ill appearing male in no acute distress HEENT: Normal NECK: No JVD CARDIAC: RRR, no rubs, gallops, soft murmur in left lateral chest (possibly referred from left arm AV fistula) RESPIRATORY:  Clear to auscultation without rales, wheezing or rhonchi  ABDOMEN: Soft, non-tender, non-distended MUSCULOSKELETAL:  Bilateral BKA. No edema; No deformity Left arm AV fistula  SKIN: Warm and dry NEUROLOGIC:  Alert and oriented x 3 PSYCHIATRIC:  Normal affect    ASSESSMENT:    1. PAF (paroxysmal atrial fibrillation) ([place])   2. Essential hypertension   3. Coronary artery disease involving native coronary artery of native heart without angina pectoris   4. Hyperlipidemia LDL goal <70   5. Dieulafoy lesion of rectum   6. Diabetes mellitus type 2 with peripheral artery disease ([place])   7. ESRD (end stage renal disease) ([place])    PLAN:    In order of problems listed above:  Paroxysmal atrial fibrillation -Pt had brief symptomatic episodes of atrial fib in the hospital. Coreg was increased to 6.25 mg bid and no further episodes noted.  -CHA2DS2/VAS Stroke Risk Score is 6 (CHF, vascular dz, HTN, age (2), DM). He was placed on coumadin for stroke risk reduciton. Pt has had bleeding during dialysis in the past and dieulafoy's lesion and thus his plavix was discontinued, aspirin continued. He will need close monitoring to assess for bleeding. Instructions given to pt and his wife to watch for unusual bleeding especially dark or bloody stools.  -Follow up for anticoagulation per PCP. Appt made for Tuesday by wife while she is here in the office.  Last INR was 1.9 on 6/26. INR checked today in the office by Claiborne Billings the pharmacist- 2.2,  she advises to continue current dose 7.5 mg daily and have checked on Tuesday. Pt and wife verb understanding.  -Today EKG shows NSR at 72 bpm.  -Pt denies any palpitations or awareness of any rhythm changes.  -With maintenance of sinus rhythm and good rate, will continue on current carvedilol dose. BP stable.  Chronic systolic and diastolic heart failure -EF 35-40% with grade 1 DD per echo in 04/2016. -Medical therapy includes carvedilol, hydralazine, isosorbide -Fluid management per HD -Managed on carvedilol, hydralazine, isosorbide and tolerating well.  -No heart failure symptoms. Appears euvolemic or even a little dry.  -continue current therapy and follow up in 1 month  CAD -Hx of MI in 2005, CABG in 2007, patent graft  on most recent cath 04/2016.  -Plavix was dc'd in the hospital with initiation of coumadin.  management on aspirin 81 mg, statin, BB, nitrate -No recent chest pain -Continue current therapy  Hypertension -amlodipine was discontinued in hospital to make room for increase in carvedilol -BP well controlled, continue current therapy   ESRD -HD M-W-F. oer nephrology  Hyperlipidemia -On Pravastatin 40 mg daily. LDL 157 on 04/10/2015. Wife says that PCP has plans to check lipids next week. Will defer to PCP.  Diabetes type 2 on Insulin -Hgb A1c 10.7 on 04/11/16 (as high as 12.3 in the last year).  Wife says that they are trying to improve. Following closely with PCP.  -Pt has serious vascular disease including PAD s/p bilat BKA, bilat carotid endarterectomies, ESRD and CAD.  Improved blood sugar control would be highly beneficial.   Normocytic anemia of chronic disease -Hgb 10.4, stable, on 6/24. Monitor regularly in setting of anticoagulation.   Medication Adjustments/Labs and Tests Ordered: Current medicines are reviewed at length with the patient today.  Concerns regarding medicines are outlined above. Labs and tests ordered and medication changes are outlined in the patient instructions below:  Patient Instructions  Medication Instructions: Your physician recommends that you continue on your current medications as directed. Please refer to the Current Medication list given to you today.   Labwork: None ordered  Testing/Procedures: None ordered  Follow-Up: Your physician recommends that  you schedule a follow-up appointment in: [place] TEAM  Any Other Special Instructions Will Be Listed Below (If Applicable).  Watch for any un usual bleeding, and report it to your primary care physician if you notice any.    DASH Eating Plan DASH stands for "Dietary Approaches to Stop Hypertension." The DASH eating plan is a healthy eating plan that has been shown to  reduce high blood pressure (hypertension). It may also reduce your risk for type 2 diabetes, heart disease, and stroke. The DASH eating plan may also help with weight loss. What are tips for following this plan? General guidelines  Avoid eating more than 2,000 mg (milligrams) of salt (sodium) a day. If you have hypertension, you may need to reduce your sodium intake to 1,500 mg a day.  Limit alcohol intake to no more than 1 drink a day for nonpregnant women and 2 drinks a day for men. One drink equals 12 oz of beer, 5 oz of wine, or 1 oz of hard liquor.  Work with your health care provider to maintain a healthy body weight or to lose weight. Ask what an ideal weight is for you.  Get at least 30 minutes of exercise that causes your heart to beat faster (aerobic exercise) most days of the week. Activities may include walking, swimming, or biking.  Work with your health care provider or diet and nutrition specialist (dietitian) to adjust your eating plan to your individual calorie needs. Reading food labels  Check food labels for the amount of sodium per serving. Choose foods with less than 5 percent of the Daily Value of sodium. Generally, foods with less than 300 mg of sodium per serving fit into this eating plan.  To find whole grains, look for the word "whole" as the first word in the ingredient list. Shopping  Buy products labeled as "low-sodium" or "no salt added."  Buy fresh foods. Avoid canned foods and premade or frozen meals. Cooking  Avoid adding salt when cooking. Use salt-free seasonings or herbs instead of table salt or sea salt. Check with your health care provider or pharmacist before using salt substitutes.  Do not fry foods. Cook foods using healthy methods such as baking, boiling, grilling, and broiling instead.  Cook with heart-healthy oils, such as olive, canola, soybean, or sunflower oil. Meal planning   Eat a balanced diet that includes: ? 5 or more servings of  fruits and vegetables each day. At each meal, try to fill half of your plate with fruits and vegetables. ? Up to 6-8 servings of whole grains each day. ? Less than 6 oz of lean meat, poultry, or fish each day. A 3-oz serving of meat is about the same size as a deck of cards. One egg equals 1 oz. ? 2 servings of low-fat dairy each day. ? A serving of nuts, seeds, or beans 5 times each week. ? Heart-healthy fats. Healthy fats called Omega-3 fatty acids are found in foods such as flaxseeds and coldwater fish, like sardines, salmon, and mackerel.  Limit how much you eat of the following: ? Canned or prepackaged foods. ? Food that is high in trans fat, such as fried foods. ? Food that is high in saturated fat, such as fatty meat. ? Sweets, desserts, sugary drinks, and other foods with added sugar. ? Full-fat dairy products.  Do not salt foods before eating.  Try to eat at least 2 vegetarian meals each week.  Eat more  home-cooked food and less restaurant, buffet, and fast food.  When eating at a restaurant, ask that your food be prepared with less salt or no salt, if possible. What foods are recommended? The items listed may not be a complete list. Talk with your dietitian about what dietary choices are best for you. Grains Whole-grain or whole-wheat bread. Whole-grain or whole-wheat pasta. Brown rice. [place]. Bulgur. Whole-grain and low-sodium cereals. Pita bread. Low-fat, low-sodium crackers. Whole-wheat flour tortillas. Vegetables Fresh or frozen vegetables (raw, steamed, roasted, or grilled). Low-sodium or reduced-sodium tomato and vegetable juice. Low-sodium or reduced-sodium tomato sauce and tomato paste. Low-sodium or reduced-sodium canned vegetables. Fruits All fresh, dried, or frozen fruit. Canned fruit in natural juice (without added sugar). Meat and other protein foods Skinless chicken or [place]. Ground chicken or [place]. Pork with fat trimmed off. Fish and seafood. Egg  whites. Dried beans, peas, or lentils. Unsalted nuts, nut butters, and seeds. Unsalted canned beans. Lean cuts of beef with fat trimmed off. Low-sodium, lean deli meat. Dairy Low-fat (1%) or fat-free (skim) milk. Fat-free, low-fat, or reduced-fat cheeses. Nonfat, low-sodium ricotta or cottage cheese. Low-fat or nonfat yogurt. Low-fat, low-sodium cheese. Fats and oils Soft margarine without trans fats. Vegetable oil. Low-fat, reduced-fat, or light mayonnaise and salad dressings (reduced-sodium). Canola, safflower, olive, soybean, and sunflower oils. Avocado. Seasoning and other foods Herbs. Spices. Seasoning mixes without salt. Unsalted popcorn and pretzels. Fat-free sweets. What foods are not recommended? The items listed may not be a complete list. Talk with your dietitian about what dietary choices are best for you. Grains Baked goods made with fat, such as croissants, muffins, or some breads. Dry pasta or rice meal packs. Vegetables Creamed or fried vegetables. Vegetables in a cheese sauce. Regular canned vegetables (not low-sodium or reduced-sodium). Regular canned tomato sauce and paste (not low-sodium or reduced-sodium). Regular tomato and vegetable juice (not low-sodium or reduced-sodium). Angie Fava. Olives. Fruits Canned fruit in a light or heavy syrup. Fried fruit. Fruit in cream or butter sauce. Meat and other protein foods Fatty cuts of meat. Ribs. Fried meat. Berniece Salines. Sausage. Bologna and other processed lunch meats. Salami. Fatback. Hotdogs. Bratwurst. Salted nuts and seeds. Canned beans with added salt. Canned or smoked fish. Whole eggs or egg yolks. Chicken or [place] with skin. Dairy Whole or 2% milk, cream, and half-and-half. Whole or full-fat cream cheese. Whole-fat or sweetened yogurt. Full-fat cheese. Nondairy creamers. Whipped toppings. Processed cheese and cheese spreads. Fats and oils Butter. Stick margarine. Lard. Shortening. Ghee. Bacon fat. Tropical oils, such as coconut,  palm kernel, or palm oil. Seasoning and other foods Salted popcorn and pretzels. Onion salt, garlic salt, seasoned salt, table salt, and sea salt. Worcestershire sauce. Tartar sauce. Barbecue sauce. Teriyaki sauce. Soy sauce, including reduced-sodium. Steak sauce. Canned and packaged gravies. Fish sauce. Oyster sauce. Cocktail sauce. Horseradish that you find on the shelf. Ketchup. Mustard. Meat flavorings and tenderizers. Bouillon cubes. Hot sauce and Tabasco sauce. Premade or packaged marinades. Premade or packaged taco seasonings. Relishes. Regular salad dressings. Where to find more information:  National Heart, Lung, and [place]: [email or web address]-[email or web address].[email or web address]/  American Heart Association: www.heart.org Summary  The DASH eating plan is a healthy eating plan that has been shown to reduce high blood pressure (hypertension). It may also reduce your risk for type 2 diabetes, heart disease, and stroke.  With the DASH eating plan, you should limit salt (sodium) intake to 2,300 mg a day. If you have hypertension, you may need to reduce your sodium intake to 1,500  mg a day.  When on the DASH eating plan, aim to eat more fresh fruits and vegetables, whole grains, lean proteins, low-fat dairy, and heart-healthy fats.  Work with your health care provider or diet and nutrition specialist (dietitian) to adjust your eating plan to your individual calorie needs. This information is not intended to replace advice given to you by your health care provider. Make sure you discuss any questions you have with your health care provider. Document Released: 12/17/2010 Document Revised: 12/22/2015 Document Reviewed: 12/22/2015 Elsevier Interactive Patient Education  2017 [employer].   If you need a refill on your cardiac medications before your next appointment, please call your pharmacy.      Signed, Daune Perch, NP  07/09/2016 2:54 PM    [facility]

## 2016-07-09 ENCOUNTER — Encounter: Payer: Self-pay | Admitting: Cardiology

## 2016-07-09 ENCOUNTER — Ambulatory Visit (INDEPENDENT_AMBULATORY_CARE_PROVIDER_SITE_OTHER): Payer: Medicare Other | Admitting: Cardiology

## 2016-07-09 VITALS — BP 118/62 | HR 72 | Ht 68.0 in

## 2016-07-09 DIAGNOSIS — I480 Paroxysmal atrial fibrillation: Secondary | ICD-10-CM

## 2016-07-09 DIAGNOSIS — K6381 Dieulafoy lesion of intestine: Secondary | ICD-10-CM

## 2016-07-09 DIAGNOSIS — N186 End stage renal disease: Secondary | ICD-10-CM | POA: Diagnosis not present

## 2016-07-09 DIAGNOSIS — I10 Essential (primary) hypertension: Secondary | ICD-10-CM

## 2016-07-09 DIAGNOSIS — E785 Hyperlipidemia, unspecified: Secondary | ICD-10-CM

## 2016-07-09 DIAGNOSIS — D631 Anemia in chronic kidney disease: Secondary | ICD-10-CM | POA: Diagnosis not present

## 2016-07-09 DIAGNOSIS — I2510 Atherosclerotic heart disease of native coronary artery without angina pectoris: Secondary | ICD-10-CM | POA: Diagnosis not present

## 2016-07-09 DIAGNOSIS — E161 Other hypoglycemia: Secondary | ICD-10-CM | POA: Diagnosis not present

## 2016-07-09 DIAGNOSIS — R739 Hyperglycemia, unspecified: Secondary | ICD-10-CM | POA: Diagnosis not present

## 2016-07-09 DIAGNOSIS — E1151 Type 2 diabetes mellitus with diabetic peripheral angiopathy without gangrene: Secondary | ICD-10-CM | POA: Diagnosis not present

## 2016-07-09 DIAGNOSIS — Z992 Dependence on renal dialysis: Secondary | ICD-10-CM

## 2016-07-09 DIAGNOSIS — E119 Type 2 diabetes mellitus without complications: Secondary | ICD-10-CM | POA: Diagnosis not present

## 2016-07-09 DIAGNOSIS — I5042 Chronic combined systolic (congestive) and diastolic (congestive) heart failure: Secondary | ICD-10-CM | POA: Diagnosis not present

## 2016-07-09 DIAGNOSIS — N2581 Secondary hyperparathyroidism of renal origin: Secondary | ICD-10-CM | POA: Diagnosis not present

## 2016-07-09 NOTE — Patient Instructions (Addendum)
Medication Instructions: Your physician recommends that you continue on your current medications as directed. Please refer to the Current Medication list given to you today.   Labwork: None ordered  Testing/Procedures: None ordered  Follow-Up: Your physician recommends that you schedule a follow-up appointment in: [place] TEAM  Any Other Special Instructions Will Be Listed Below (If Applicable).  Watch for any un usual bleeding, and report it to your primary care physician if you notice any.    DASH Eating Plan DASH stands for "Dietary Approaches to Stop Hypertension." The DASH eating plan is a healthy eating plan that has been shown to reduce high blood pressure (hypertension). It may also reduce your risk for type 2 diabetes, heart disease, and stroke. The DASH eating plan may also help with weight loss. What are tips for following this plan? General guidelines  Avoid eating more than 2,000 mg (milligrams) of salt (sodium) a day. If you have hypertension, you may need to reduce your sodium intake to 1,500 mg a day.  Limit alcohol intake to no more than 1 drink a day for nonpregnant women and 2 drinks a day for men. One drink equals 12 oz of beer, 5 oz of wine, or 1 oz of hard liquor.  Work with your health care provider to maintain a healthy body weight or to lose weight. Ask what an ideal weight is for you.  Get at least 30 minutes of exercise that causes your heart to beat faster (aerobic exercise) most days of the week. Activities may include walking, swimming, or biking.  Work with your health care provider or diet and nutrition specialist (dietitian) to adjust your eating plan to your individual calorie needs. Reading food labels  Check food labels for the amount of sodium per serving. Choose foods with less than 5 percent of the Daily Value of sodium. Generally, foods with less than 300 mg of sodium per serving fit into this eating plan.  To  find whole grains, look for the word "whole" as the first word in the ingredient list. Shopping  Buy products labeled as "low-sodium" or "no salt added."  Buy fresh foods. Avoid canned foods and premade or frozen meals. Cooking  Avoid adding salt when cooking. Use salt-free seasonings or herbs instead of table salt or sea salt. Check with your health care provider or pharmacist before using salt substitutes.  Do not fry foods. Cook foods using healthy methods such as baking, boiling, grilling, and broiling instead.  Cook with heart-healthy oils, such as olive, canola, soybean, or sunflower oil. Meal planning   Eat a balanced diet that includes: ? 5 or more servings of fruits and vegetables each day. At each meal, try to fill half of your plate with fruits and vegetables. ? Up to 6-8 servings of whole grains each day. ? Less than 6 oz of lean meat, poultry, or fish each day. A 3-oz serving of meat is about the same size as a deck of cards. One egg equals 1 oz. ? 2 servings of low-fat dairy each day. ? A serving of nuts, seeds, or beans 5 times each week. ? Heart-healthy fats. Healthy fats called Omega-3 fatty acids are found in foods such as flaxseeds and coldwater fish, like sardines, salmon, and mackerel.  Limit how much you eat of the following: ? Canned or prepackaged foods. ? Food that is high in trans fat, such as fried foods. ? Food that is high in saturated fat, such  as fatty meat. ? Sweets, desserts, sugary drinks, and other foods with added sugar. ? Full-fat dairy products.  Do not salt foods before eating.  Try to eat at least 2 vegetarian meals each week.  Eat more home-cooked food and less restaurant, buffet, and fast food.  When eating at a restaurant, ask that your food be prepared with less salt or no salt, if possible. What foods are recommended? The items listed may not be a complete list. Talk with your dietitian about what dietary choices are best for  you. Grains Whole-grain or whole-wheat bread. Whole-grain or whole-wheat pasta. Brown rice. [place]. Bulgur. Whole-grain and low-sodium cereals. Pita bread. Low-fat, low-sodium crackers. Whole-wheat flour tortillas. Vegetables Fresh or frozen vegetables (raw, steamed, roasted, or grilled). Low-sodium or reduced-sodium tomato and vegetable juice. Low-sodium or reduced-sodium tomato sauce and tomato paste. Low-sodium or reduced-sodium canned vegetables. Fruits All fresh, dried, or frozen fruit. Canned fruit in natural juice (without added sugar). Meat and other protein foods Skinless chicken or [place]. Ground chicken or [place]. Pork with fat trimmed off. Fish and seafood. Egg whites. Dried beans, peas, or lentils. Unsalted nuts, nut butters, and seeds. Unsalted canned beans. Lean cuts of beef with fat trimmed off. Low-sodium, lean deli meat. Dairy Low-fat (1%) or fat-free (skim) milk. Fat-free, low-fat, or reduced-fat cheeses. Nonfat, low-sodium ricotta or cottage cheese. Low-fat or nonfat yogurt. Low-fat, low-sodium cheese. Fats and oils Soft margarine without trans fats. Vegetable oil. Low-fat, reduced-fat, or light mayonnaise and salad dressings (reduced-sodium). Canola, safflower, olive, soybean, and sunflower oils. Avocado. Seasoning and other foods Herbs. Spices. Seasoning mixes without salt. Unsalted popcorn and pretzels. Fat-free sweets. What foods are not recommended? The items listed may not be a complete list. Talk with your dietitian about what dietary choices are best for you. Grains Baked goods made with fat, such as croissants, muffins, or some breads. Dry pasta or rice meal packs. Vegetables Creamed or fried vegetables. Vegetables in a cheese sauce. Regular canned vegetables (not low-sodium or reduced-sodium). Regular canned tomato sauce and paste (not low-sodium or reduced-sodium). Regular tomato and vegetable juice (not low-sodium or reduced-sodium). Angie Fava.  Olives. Fruits Canned fruit in a light or heavy syrup. Fried fruit. Fruit in cream or butter sauce. Meat and other protein foods Fatty cuts of meat. Ribs. Fried meat. Berniece Salines. Sausage. Bologna and other processed lunch meats. Salami. Fatback. Hotdogs. Bratwurst. Salted nuts and seeds. Canned beans with added salt. Canned or smoked fish. Whole eggs or egg yolks. Chicken or [place] with skin. Dairy Whole or 2% milk, cream, and half-and-half. Whole or full-fat cream cheese. Whole-fat or sweetened yogurt. Full-fat cheese. Nondairy creamers. Whipped toppings. Processed cheese and cheese spreads. Fats and oils Butter. Stick margarine. Lard. Shortening. Ghee. Bacon fat. Tropical oils, such as coconut, palm kernel, or palm oil. Seasoning and other foods Salted popcorn and pretzels. Onion salt, garlic salt, seasoned salt, table salt, and sea salt. Worcestershire sauce. Tartar sauce. Barbecue sauce. Teriyaki sauce. Soy sauce, including reduced-sodium. Steak sauce. Canned and packaged gravies. Fish sauce. Oyster sauce. Cocktail sauce. Horseradish that you find on the shelf. Ketchup. Mustard. Meat flavorings and tenderizers. Bouillon cubes. Hot sauce and Tabasco sauce. Premade or packaged marinades. Premade or packaged taco seasonings. Relishes. Regular salad dressings. Where to find more information:  National Heart, Lung, and [place]: https://wilson-eaton.com/  American Heart Association: www.heart.org Summary  The DASH eating plan is a healthy eating plan that has been shown to reduce high blood pressure (hypertension). It may also reduce your risk  for type 2 diabetes, heart disease, and stroke.  With the DASH eating plan, you should limit salt (sodium) intake to 2,300 mg a day. If you have hypertension, you may need to reduce your sodium intake to 1,500 mg a day.  When on the DASH eating plan, aim to eat more fresh fruits and vegetables, whole grains, lean proteins, low-fat dairy, and heart-healthy  fats.  Work with your health care provider or diet and nutrition specialist (dietitian) to adjust your eating plan to your individual calorie needs. This information is not intended to replace advice given to you by your health care provider. Make sure you discuss any questions you have with your health care provider. Document Released: 12/17/2010 Document Revised: 12/22/2015 Document Reviewed: 12/22/2015 Elsevier Interactive Patient Education  2017 [employer].   If you need a refill on your cardiac medications before your next appointment, please call your pharmacy.

## 2016-07-10 DIAGNOSIS — E1122 Type 2 diabetes mellitus with diabetic chronic kidney disease: Secondary | ICD-10-CM | POA: Diagnosis not present

## 2016-07-10 DIAGNOSIS — Z992 Dependence on renal dialysis: Secondary | ICD-10-CM | POA: Diagnosis not present

## 2016-07-10 DIAGNOSIS — N186 End stage renal disease: Secondary | ICD-10-CM | POA: Diagnosis not present

## 2016-07-12 DIAGNOSIS — I132 Hypertensive heart and chronic kidney disease with heart failure and with stage 5 chronic kidney disease, or end stage renal disease: Secondary | ICD-10-CM | POA: Diagnosis not present

## 2016-07-12 DIAGNOSIS — N186 End stage renal disease: Secondary | ICD-10-CM | POA: Diagnosis not present

## 2016-07-12 DIAGNOSIS — I5042 Chronic combined systolic (congestive) and diastolic (congestive) heart failure: Secondary | ICD-10-CM | POA: Diagnosis not present

## 2016-07-12 DIAGNOSIS — E119 Type 2 diabetes mellitus without complications: Secondary | ICD-10-CM | POA: Diagnosis not present

## 2016-07-12 DIAGNOSIS — R739 Hyperglycemia, unspecified: Secondary | ICD-10-CM | POA: Diagnosis not present

## 2016-07-12 DIAGNOSIS — E161 Other hypoglycemia: Secondary | ICD-10-CM | POA: Diagnosis not present

## 2016-07-12 DIAGNOSIS — E1122 Type 2 diabetes mellitus with diabetic chronic kidney disease: Secondary | ICD-10-CM | POA: Diagnosis not present

## 2016-07-12 DIAGNOSIS — E1165 Type 2 diabetes mellitus with hyperglycemia: Secondary | ICD-10-CM | POA: Diagnosis not present

## 2016-07-12 DIAGNOSIS — D631 Anemia in chronic kidney disease: Secondary | ICD-10-CM | POA: Diagnosis not present

## 2016-07-12 DIAGNOSIS — N2581 Secondary hyperparathyroidism of renal origin: Secondary | ICD-10-CM | POA: Diagnosis not present

## 2016-07-13 DIAGNOSIS — I5042 Chronic combined systolic (congestive) and diastolic (congestive) heart failure: Secondary | ICD-10-CM | POA: Diagnosis not present

## 2016-07-13 DIAGNOSIS — Z09 Encounter for follow-up examination after completed treatment for conditions other than malignant neoplasm: Secondary | ICD-10-CM | POA: Diagnosis not present

## 2016-07-13 DIAGNOSIS — D649 Anemia, unspecified: Secondary | ICD-10-CM | POA: Diagnosis not present

## 2016-07-13 DIAGNOSIS — Z7901 Long term (current) use of anticoagulants: Secondary | ICD-10-CM | POA: Diagnosis not present

## 2016-07-13 DIAGNOSIS — I480 Paroxysmal atrial fibrillation: Secondary | ICD-10-CM | POA: Diagnosis not present

## 2016-07-14 DIAGNOSIS — D631 Anemia in chronic kidney disease: Secondary | ICD-10-CM | POA: Diagnosis not present

## 2016-07-14 DIAGNOSIS — R739 Hyperglycemia, unspecified: Secondary | ICD-10-CM | POA: Diagnosis not present

## 2016-07-14 DIAGNOSIS — E119 Type 2 diabetes mellitus without complications: Secondary | ICD-10-CM | POA: Diagnosis not present

## 2016-07-14 DIAGNOSIS — E161 Other hypoglycemia: Secondary | ICD-10-CM | POA: Diagnosis not present

## 2016-07-14 DIAGNOSIS — N186 End stage renal disease: Secondary | ICD-10-CM | POA: Diagnosis not present

## 2016-07-14 DIAGNOSIS — N2581 Secondary hyperparathyroidism of renal origin: Secondary | ICD-10-CM | POA: Diagnosis not present

## 2016-07-15 DIAGNOSIS — I5042 Chronic combined systolic (congestive) and diastolic (congestive) heart failure: Secondary | ICD-10-CM | POA: Diagnosis not present

## 2016-07-15 DIAGNOSIS — E1165 Type 2 diabetes mellitus with hyperglycemia: Secondary | ICD-10-CM | POA: Diagnosis not present

## 2016-07-15 DIAGNOSIS — I132 Hypertensive heart and chronic kidney disease with heart failure and with stage 5 chronic kidney disease, or end stage renal disease: Secondary | ICD-10-CM | POA: Diagnosis not present

## 2016-07-15 DIAGNOSIS — E1122 Type 2 diabetes mellitus with diabetic chronic kidney disease: Secondary | ICD-10-CM | POA: Diagnosis not present

## 2016-07-15 DIAGNOSIS — D631 Anemia in chronic kidney disease: Secondary | ICD-10-CM | POA: Diagnosis not present

## 2016-07-15 DIAGNOSIS — N186 End stage renal disease: Secondary | ICD-10-CM | POA: Diagnosis not present

## 2016-07-16 DIAGNOSIS — N186 End stage renal disease: Secondary | ICD-10-CM | POA: Diagnosis not present

## 2016-07-16 DIAGNOSIS — I132 Hypertensive heart and chronic kidney disease with heart failure and with stage 5 chronic kidney disease, or end stage renal disease: Secondary | ICD-10-CM | POA: Diagnosis not present

## 2016-07-16 DIAGNOSIS — E1122 Type 2 diabetes mellitus with diabetic chronic kidney disease: Secondary | ICD-10-CM | POA: Diagnosis not present

## 2016-07-16 DIAGNOSIS — D631 Anemia in chronic kidney disease: Secondary | ICD-10-CM | POA: Diagnosis not present

## 2016-07-16 DIAGNOSIS — I5042 Chronic combined systolic (congestive) and diastolic (congestive) heart failure: Secondary | ICD-10-CM | POA: Diagnosis not present

## 2016-07-16 DIAGNOSIS — R739 Hyperglycemia, unspecified: Secondary | ICD-10-CM | POA: Diagnosis not present

## 2016-07-16 DIAGNOSIS — E161 Other hypoglycemia: Secondary | ICD-10-CM | POA: Diagnosis not present

## 2016-07-16 DIAGNOSIS — E119 Type 2 diabetes mellitus without complications: Secondary | ICD-10-CM | POA: Diagnosis not present

## 2016-07-16 DIAGNOSIS — E1165 Type 2 diabetes mellitus with hyperglycemia: Secondary | ICD-10-CM | POA: Diagnosis not present

## 2016-07-16 DIAGNOSIS — N2581 Secondary hyperparathyroidism of renal origin: Secondary | ICD-10-CM | POA: Diagnosis not present

## 2016-07-19 DIAGNOSIS — E119 Type 2 diabetes mellitus without complications: Secondary | ICD-10-CM | POA: Diagnosis not present

## 2016-07-19 DIAGNOSIS — N2581 Secondary hyperparathyroidism of renal origin: Secondary | ICD-10-CM | POA: Diagnosis not present

## 2016-07-19 DIAGNOSIS — N186 End stage renal disease: Secondary | ICD-10-CM | POA: Diagnosis not present

## 2016-07-19 DIAGNOSIS — R739 Hyperglycemia, unspecified: Secondary | ICD-10-CM | POA: Diagnosis not present

## 2016-07-19 DIAGNOSIS — D631 Anemia in chronic kidney disease: Secondary | ICD-10-CM | POA: Diagnosis not present

## 2016-07-19 DIAGNOSIS — E161 Other hypoglycemia: Secondary | ICD-10-CM | POA: Diagnosis not present

## 2016-07-20 DIAGNOSIS — E1122 Type 2 diabetes mellitus with diabetic chronic kidney disease: Secondary | ICD-10-CM | POA: Diagnosis not present

## 2016-07-20 DIAGNOSIS — I132 Hypertensive heart and chronic kidney disease with heart failure and with stage 5 chronic kidney disease, or end stage renal disease: Secondary | ICD-10-CM | POA: Diagnosis not present

## 2016-07-20 DIAGNOSIS — E1165 Type 2 diabetes mellitus with hyperglycemia: Secondary | ICD-10-CM | POA: Diagnosis not present

## 2016-07-20 DIAGNOSIS — N186 End stage renal disease: Secondary | ICD-10-CM | POA: Diagnosis not present

## 2016-07-20 DIAGNOSIS — D631 Anemia in chronic kidney disease: Secondary | ICD-10-CM | POA: Diagnosis not present

## 2016-07-20 DIAGNOSIS — I5042 Chronic combined systolic (congestive) and diastolic (congestive) heart failure: Secondary | ICD-10-CM | POA: Diagnosis not present

## 2016-07-21 DIAGNOSIS — D631 Anemia in chronic kidney disease: Secondary | ICD-10-CM | POA: Diagnosis not present

## 2016-07-21 DIAGNOSIS — N186 End stage renal disease: Secondary | ICD-10-CM | POA: Diagnosis not present

## 2016-07-21 DIAGNOSIS — Z7901 Long term (current) use of anticoagulants: Secondary | ICD-10-CM | POA: Diagnosis not present

## 2016-07-21 DIAGNOSIS — E119 Type 2 diabetes mellitus without complications: Secondary | ICD-10-CM | POA: Diagnosis not present

## 2016-07-21 DIAGNOSIS — E161 Other hypoglycemia: Secondary | ICD-10-CM | POA: Diagnosis not present

## 2016-07-21 DIAGNOSIS — N2581 Secondary hyperparathyroidism of renal origin: Secondary | ICD-10-CM | POA: Diagnosis not present

## 2016-07-21 DIAGNOSIS — R739 Hyperglycemia, unspecified: Secondary | ICD-10-CM | POA: Diagnosis not present

## 2016-07-22 DIAGNOSIS — D631 Anemia in chronic kidney disease: Secondary | ICD-10-CM | POA: Diagnosis not present

## 2016-07-22 DIAGNOSIS — I5042 Chronic combined systolic (congestive) and diastolic (congestive) heart failure: Secondary | ICD-10-CM | POA: Diagnosis not present

## 2016-07-22 DIAGNOSIS — N186 End stage renal disease: Secondary | ICD-10-CM | POA: Diagnosis not present

## 2016-07-22 DIAGNOSIS — I132 Hypertensive heart and chronic kidney disease with heart failure and with stage 5 chronic kidney disease, or end stage renal disease: Secondary | ICD-10-CM | POA: Diagnosis not present

## 2016-07-22 DIAGNOSIS — E1122 Type 2 diabetes mellitus with diabetic chronic kidney disease: Secondary | ICD-10-CM | POA: Diagnosis not present

## 2016-07-22 DIAGNOSIS — E1165 Type 2 diabetes mellitus with hyperglycemia: Secondary | ICD-10-CM | POA: Diagnosis not present

## 2016-07-23 DIAGNOSIS — R739 Hyperglycemia, unspecified: Secondary | ICD-10-CM | POA: Diagnosis not present

## 2016-07-23 DIAGNOSIS — N186 End stage renal disease: Secondary | ICD-10-CM | POA: Diagnosis not present

## 2016-07-23 DIAGNOSIS — E119 Type 2 diabetes mellitus without complications: Secondary | ICD-10-CM | POA: Diagnosis not present

## 2016-07-23 DIAGNOSIS — E161 Other hypoglycemia: Secondary | ICD-10-CM | POA: Diagnosis not present

## 2016-07-23 DIAGNOSIS — N2581 Secondary hyperparathyroidism of renal origin: Secondary | ICD-10-CM | POA: Diagnosis not present

## 2016-07-23 DIAGNOSIS — D631 Anemia in chronic kidney disease: Secondary | ICD-10-CM | POA: Diagnosis not present

## 2016-07-26 DIAGNOSIS — N2581 Secondary hyperparathyroidism of renal origin: Secondary | ICD-10-CM | POA: Diagnosis not present

## 2016-07-26 DIAGNOSIS — D631 Anemia in chronic kidney disease: Secondary | ICD-10-CM | POA: Diagnosis not present

## 2016-07-26 DIAGNOSIS — E119 Type 2 diabetes mellitus without complications: Secondary | ICD-10-CM | POA: Diagnosis not present

## 2016-07-26 DIAGNOSIS — E161 Other hypoglycemia: Secondary | ICD-10-CM | POA: Diagnosis not present

## 2016-07-26 DIAGNOSIS — R739 Hyperglycemia, unspecified: Secondary | ICD-10-CM | POA: Diagnosis not present

## 2016-07-26 DIAGNOSIS — N186 End stage renal disease: Secondary | ICD-10-CM | POA: Diagnosis not present

## 2016-07-27 DIAGNOSIS — D631 Anemia in chronic kidney disease: Secondary | ICD-10-CM | POA: Diagnosis not present

## 2016-07-27 DIAGNOSIS — I5042 Chronic combined systolic (congestive) and diastolic (congestive) heart failure: Secondary | ICD-10-CM | POA: Diagnosis not present

## 2016-07-27 DIAGNOSIS — E1165 Type 2 diabetes mellitus with hyperglycemia: Secondary | ICD-10-CM | POA: Diagnosis not present

## 2016-07-27 DIAGNOSIS — N186 End stage renal disease: Secondary | ICD-10-CM | POA: Diagnosis not present

## 2016-07-27 DIAGNOSIS — I132 Hypertensive heart and chronic kidney disease with heart failure and with stage 5 chronic kidney disease, or end stage renal disease: Secondary | ICD-10-CM | POA: Diagnosis not present

## 2016-07-27 DIAGNOSIS — E1122 Type 2 diabetes mellitus with diabetic chronic kidney disease: Secondary | ICD-10-CM | POA: Diagnosis not present

## 2016-07-28 DIAGNOSIS — E119 Type 2 diabetes mellitus without complications: Secondary | ICD-10-CM | POA: Diagnosis not present

## 2016-07-28 DIAGNOSIS — N186 End stage renal disease: Secondary | ICD-10-CM | POA: Diagnosis not present

## 2016-07-28 DIAGNOSIS — R739 Hyperglycemia, unspecified: Secondary | ICD-10-CM | POA: Diagnosis not present

## 2016-07-28 DIAGNOSIS — E161 Other hypoglycemia: Secondary | ICD-10-CM | POA: Diagnosis not present

## 2016-07-28 DIAGNOSIS — N2581 Secondary hyperparathyroidism of renal origin: Secondary | ICD-10-CM | POA: Diagnosis not present

## 2016-07-28 DIAGNOSIS — D631 Anemia in chronic kidney disease: Secondary | ICD-10-CM | POA: Diagnosis not present

## 2016-07-29 DIAGNOSIS — E1165 Type 2 diabetes mellitus with hyperglycemia: Secondary | ICD-10-CM | POA: Diagnosis not present

## 2016-07-29 DIAGNOSIS — N186 End stage renal disease: Secondary | ICD-10-CM | POA: Diagnosis not present

## 2016-07-29 DIAGNOSIS — D631 Anemia in chronic kidney disease: Secondary | ICD-10-CM | POA: Diagnosis not present

## 2016-07-29 DIAGNOSIS — E1122 Type 2 diabetes mellitus with diabetic chronic kidney disease: Secondary | ICD-10-CM | POA: Diagnosis not present

## 2016-07-29 DIAGNOSIS — I5042 Chronic combined systolic (congestive) and diastolic (congestive) heart failure: Secondary | ICD-10-CM | POA: Diagnosis not present

## 2016-07-29 DIAGNOSIS — I132 Hypertensive heart and chronic kidney disease with heart failure and with stage 5 chronic kidney disease, or end stage renal disease: Secondary | ICD-10-CM | POA: Diagnosis not present

## 2016-07-30 DIAGNOSIS — E119 Type 2 diabetes mellitus without complications: Secondary | ICD-10-CM | POA: Diagnosis not present

## 2016-07-30 DIAGNOSIS — R739 Hyperglycemia, unspecified: Secondary | ICD-10-CM | POA: Diagnosis not present

## 2016-07-30 DIAGNOSIS — N2581 Secondary hyperparathyroidism of renal origin: Secondary | ICD-10-CM | POA: Diagnosis not present

## 2016-07-30 DIAGNOSIS — E161 Other hypoglycemia: Secondary | ICD-10-CM | POA: Diagnosis not present

## 2016-07-30 DIAGNOSIS — N186 End stage renal disease: Secondary | ICD-10-CM | POA: Diagnosis not present

## 2016-07-30 DIAGNOSIS — D631 Anemia in chronic kidney disease: Secondary | ICD-10-CM | POA: Diagnosis not present

## 2016-08-02 DIAGNOSIS — D631 Anemia in chronic kidney disease: Secondary | ICD-10-CM | POA: Diagnosis not present

## 2016-08-02 DIAGNOSIS — E161 Other hypoglycemia: Secondary | ICD-10-CM | POA: Diagnosis not present

## 2016-08-02 DIAGNOSIS — N186 End stage renal disease: Secondary | ICD-10-CM | POA: Diagnosis not present

## 2016-08-02 DIAGNOSIS — R739 Hyperglycemia, unspecified: Secondary | ICD-10-CM | POA: Diagnosis not present

## 2016-08-02 DIAGNOSIS — N2581 Secondary hyperparathyroidism of renal origin: Secondary | ICD-10-CM | POA: Diagnosis not present

## 2016-08-02 DIAGNOSIS — E119 Type 2 diabetes mellitus without complications: Secondary | ICD-10-CM | POA: Diagnosis not present

## 2016-08-03 DIAGNOSIS — N186 End stage renal disease: Secondary | ICD-10-CM | POA: Diagnosis not present

## 2016-08-03 DIAGNOSIS — E1122 Type 2 diabetes mellitus with diabetic chronic kidney disease: Secondary | ICD-10-CM | POA: Diagnosis not present

## 2016-08-03 DIAGNOSIS — I5042 Chronic combined systolic (congestive) and diastolic (congestive) heart failure: Secondary | ICD-10-CM | POA: Diagnosis not present

## 2016-08-03 DIAGNOSIS — E1165 Type 2 diabetes mellitus with hyperglycemia: Secondary | ICD-10-CM | POA: Diagnosis not present

## 2016-08-03 DIAGNOSIS — I132 Hypertensive heart and chronic kidney disease with heart failure and with stage 5 chronic kidney disease, or end stage renal disease: Secondary | ICD-10-CM | POA: Diagnosis not present

## 2016-08-03 DIAGNOSIS — D631 Anemia in chronic kidney disease: Secondary | ICD-10-CM | POA: Diagnosis not present

## 2016-08-04 DIAGNOSIS — E119 Type 2 diabetes mellitus without complications: Secondary | ICD-10-CM | POA: Diagnosis not present

## 2016-08-04 DIAGNOSIS — D631 Anemia in chronic kidney disease: Secondary | ICD-10-CM | POA: Diagnosis not present

## 2016-08-04 DIAGNOSIS — N186 End stage renal disease: Secondary | ICD-10-CM | POA: Diagnosis not present

## 2016-08-04 DIAGNOSIS — R739 Hyperglycemia, unspecified: Secondary | ICD-10-CM | POA: Diagnosis not present

## 2016-08-04 DIAGNOSIS — N2581 Secondary hyperparathyroidism of renal origin: Secondary | ICD-10-CM | POA: Diagnosis not present

## 2016-08-04 DIAGNOSIS — E161 Other hypoglycemia: Secondary | ICD-10-CM | POA: Diagnosis not present

## 2016-08-05 ENCOUNTER — Encounter: Payer: Self-pay | Admitting: Cardiovascular Disease

## 2016-08-05 ENCOUNTER — Ambulatory Visit (INDEPENDENT_AMBULATORY_CARE_PROVIDER_SITE_OTHER): Payer: Medicare Other | Admitting: Cardiovascular Disease

## 2016-08-05 VITALS — BP 150/52 | HR 79 | Ht 68.0 in

## 2016-08-05 DIAGNOSIS — I5042 Chronic combined systolic (congestive) and diastolic (congestive) heart failure: Secondary | ICD-10-CM

## 2016-08-05 DIAGNOSIS — I2510 Atherosclerotic heart disease of native coronary artery without angina pectoris: Secondary | ICD-10-CM | POA: Diagnosis not present

## 2016-08-05 DIAGNOSIS — I132 Hypertensive heart and chronic kidney disease with heart failure and with stage 5 chronic kidney disease, or end stage renal disease: Secondary | ICD-10-CM | POA: Diagnosis not present

## 2016-08-05 DIAGNOSIS — I480 Paroxysmal atrial fibrillation: Secondary | ICD-10-CM | POA: Diagnosis not present

## 2016-08-05 DIAGNOSIS — E1165 Type 2 diabetes mellitus with hyperglycemia: Secondary | ICD-10-CM | POA: Diagnosis not present

## 2016-08-05 DIAGNOSIS — N186 End stage renal disease: Secondary | ICD-10-CM | POA: Diagnosis not present

## 2016-08-05 DIAGNOSIS — E1122 Type 2 diabetes mellitus with diabetic chronic kidney disease: Secondary | ICD-10-CM | POA: Diagnosis not present

## 2016-08-05 DIAGNOSIS — I255 Ischemic cardiomyopathy: Secondary | ICD-10-CM

## 2016-08-05 DIAGNOSIS — D631 Anemia in chronic kidney disease: Secondary | ICD-10-CM | POA: Diagnosis not present

## 2016-08-05 NOTE — Patient Instructions (Signed)

## 2016-08-05 NOTE — Progress Notes (Signed)
Chief Complaint  Patient presents with  . Follow-up    History of Present Illness: 76 yo male with history of ESRD on HD, ischemic cardiomyopathy, chronic systolic CHF, PAF, CAD s/p 4V CABG, HTN, DM, PAD s/p bilateral BKA who is here today for cardiac follow up. I met him in 2013 but I have not seen him since then. He was seen in my office in 2013 for cardiac evaluation prior to planned left femoral to tibial artery bypass surgery. He underwent 4V CABG in 2007 (LIMA to LAD, SVG to Diagonal, SVG to OM, SVG to distal RCA). He was lost to follow up in our office since 2013. He was admitted to [facility] with volume overload and elevated troponin. Cardiac cath 04/12/16 with chronic occlusion of all native coronaries, all 4 bypass grafts patent. Imdur and hydralazine were added. Echo 04/11/16 with LVEF=35-40%, no significant valve disease. Fluid removed by HD. Admitted again in June 2016 with volume overload and was found to be in atrial fibrillation. He was started on coumadin.   He is here today for follow up. The patient denies any chest pain, dyspnea, palpitations, lower extremity edema, orthopnea, PND, dizziness, near syncope or syncope.   Primary Care Physician: Bartholome Bill, MD  Nephrology: Joelyn Oms  Past Medical History:  Diagnosis Date  . Abnormality of gait   . Anemia   . BPH (benign prostatic hypertrophy)   . CHF (congestive heart failure) ([place])   . Complication of anesthesia    "he gets delirious"  . Constipation    takes Miralax daily  . Coronary artery disease    a. s/p NSTEMI/CABGx4 in 2007 - LIMA-LAD, SVG-optional diagonal, SVVG-OM, SVG-dRCA. b. Nuc 05/2011: nonischemic.  . Diabetic peripheral neuropathy ([place])   . Dieulafoy lesion of rectum 05/03/2015  . Disorder of bone and cartilage, unspecified   . DVT (deep venous thrombosis) ([place])    a. Lower extremity DVT in 2013.  [name] Dyspnea   . ESRD on dialysis [facility])    M-W-F  . Gangrene of toe (H[facility])    dry  . Hemorrhoids   . Hx  of colonic polyps   . Hyperlipidemia   . Hypertension   . Hypertrophy of prostate without urinary obstruction and other lower urinary tract symptoms (LUTS)   . Ischemic cardiomyopathy    a. EF 40% in 2007. b. 51% by nuc 05/2011.  [name] Kidney stones   . Lower limb amputation, below knee   . Myocardial infarction ([place]) 2005  . Neuromuscular disorder ([place])    diabetic neuropathy  . NSTEMI (non-ST elevated myocardial infarction) ([place]) 04/2016  . PAD (peripheral artery disease) ([place])    a. R CEA 2007. b. Hx L BKA in 2013. c. s/p LE angioplasty in 2014. d. Hx R BKA in 2014.  [name] Peripheral neuropathy   . Prostate cancer ([place]) 2010  . Secondary hyperparathyroidism ([place])    Secondary Hyperpara- Thyroidism, Renal  . Type II diabetes mellitus ([place])     Past Surgical History:  Procedure Laterality Date  . ABDOMINAL AORTAGRAM N/A 05/28/2011   Procedure: ABDOMINAL Maxcine Ham;  Surgeon: Elam Dutch, MD;  Location: [facility] CATH LAB;  Service: Cardiovascular;  Laterality: N/A;  . ABDOMINAL AORTAGRAM N/A 06/02/2012   Procedure: ABDOMINAL Maxcine Ham;  Surgeon: Elam Dutch, MD;  Location: [facility] CATH LAB;  Service: Cardiovascular;  Laterality: N/A;  . ABDOMINAL AORTAGRAM N/A 06/09/2012   Procedure: ABDOMINAL Maxcine Ham;  Surgeon: Elam Dutch, MD;  Location: [facility] CATH LAB;  Service:  Cardiovascular;  Laterality: N/A;  . AMPUTATION  06/14/2011   Procedure: AMPUTATION DIGIT;  Surgeon: Elam Dutch, MD;  Location: [facility] OR;  Service: Vascular;  Laterality: Left;  Amputation Left fifth toe  . AMPUTATION  06/16/2011   Procedure: AMPUTATION BELOW KNEE;  Surgeon: Elam Dutch, MD;  Location: [place];  Service: Vascular;  Laterality: Left;  . AMPUTATION Right 06/12/2012   Procedure: AMPUTATION DIGIT;  Surgeon: Elam Dutch, MD;  Location: [place];  Service: Vascular;  Laterality: Right;  GREAT TOE  . AMPUTATION Right 08/08/2012   Procedure: AMPUTATION BELOW KNEE;  Surgeon: Elam Dutch, MD;  Location: [place];   Service: Vascular;  Laterality: Right;  . AV FISTULA PLACEMENT Right 04/19/2014   Procedure: RIGHT ARM ARTERIOVENOUS (AV) FISTULA CREATION;  Surgeon: Mal Misty, MD;  Location: [place];  Service: Vascular;  Laterality: Right;  . AV FISTULA PLACEMENT Left 12/12/2014   Procedure: BRACHIOCEPHALIC ARTERIOVENOUS (AV) FISTULA CREATION;  Surgeon: Mal Misty, MD;  Location: [place];  Service: Vascular;  Laterality: Left;  . CARDIAC CATHETERIZATION  05/28/11  . CARDIAC CATHETERIZATION N/A 10/04/2014   Procedure: Left Heart Cath and Coronary Angiography;  Surgeon: Sherren Mocha, MD;  Location: [place] CV LAB;  Service: Cardiovascular;  Laterality: N/A;  . CAROTID ENDARTERECTOMY Right 2005  . CATARACT EXTRACTION W/ INTRAOCULAR LENS  IMPLANT, BILATERAL Bilateral 2011  . COLONOSCOPY    . COLONOSCOPY N/A 05/03/2015   Procedure: COLONOSCOPY;  Surgeon: Gatha Mayer, MD;  Location: [place];  Service: Endoscopy;  Laterality: N/A;  . CORONARY ARTERY BYPASS GRAFT  2005   CABG X4  . ENDARTERECTOMY Left 10/17/2014   Procedure: ENDARTERECTOMY CAROTID;  Surgeon: Mal Misty, MD;  Location: [place];  Service: Vascular;  Laterality: Left;  . I&D EXTREMITY  01/31/2012   Procedure: IRRIGATION AND DEBRIDEMENT EXTREMITY;  Surgeon: Elam Dutch, MD;  Location: [place];  Service: Vascular;  Laterality: Left;  I & D Left BKA   . INSERTION OF DIALYSIS CATHETER Right 04/19/2014   Procedure: INSERTION OF DIALYSIS CATHETER-INTERNAL JUGULAR;  Surgeon: Mal Misty, MD;  Location: [place];  Service: Vascular;  Laterality: Right;  . INSERTION OF DIALYSIS CATHETER N/A 11/06/2014   Procedure: INSERTION OF 23cm DIALYSIS CATHETER - right internal jugular;  Surgeon: Mal Misty, MD;  Location: [place];  Service: Vascular;  Laterality: N/A;  . KNEE CARTILAGE SURGERY Left 1964  . LEFT HEART CATH AND CORONARY ANGIOGRAPHY N/A 04/12/2016   Procedure: Left Heart Cath and Coronary Angiography;  Surgeon: Nelva Bush, MD;  Location:  [place] CV LAB;  Service: Cardiovascular;  Laterality: N/A;  . LIGATION OF ARTERIOVENOUS  FISTULA Right 11/06/2014   Procedure: LIGATION OF RADIOCEPHALIC ARTERIOVENOUS FISTULA;  Surgeon: Mal Misty, MD;  Location: [place];  Service: Vascular;  Laterality: Right;  . LIGATION OF COMPETING BRANCHES OF ARTERIOVENOUS FISTULA Right 06/19/2014   Procedure: Right Arm LIGATION OF COMPETING BRANCHES OF RADIOCEPHALIC ARTERIOVENOUS FISTULA;  Surgeon: Mal Misty, MD;  Location: [place];  Service: Vascular;  Laterality: Right;  . PROSTATECTOMY  2009    Current Outpatient Prescriptions  Medication Sig Dispense Refill  . amLODipine (NORVASC) 5 MG tablet Take 1 tablet (5 mg total) by mouth daily. Take one tablet by mouth once daily 30 tablet 3  . aspirin EC 81 MG tablet Take 81 mg by mouth daily.    Lorin Picket 1 GM 210 MG(Fe) tablet Take 420 mg by mouth 3 (three) times  daily.  0  . carvedilol (COREG) 6.25 MG tablet Take 1 tablet (6.25 mg total) by mouth 2 (two) times daily with a meal. 60 tablet 0  . clopidogrel (PLAVIX) 75 MG tablet Take 75 mg by mouth daily.    . hydrALAZINE (APRESOLINE) 25 MG tablet Take 1 tablet (25 mg total) by mouth every 8 (eight) hours. 90 tablet 3  . HYDROcodone-acetaminophen (NORCO/VICODIN) 5-325 MG tablet Take 1 tablet by mouth every 8 (eight) hours as needed for moderate pain.    . isosorbide mononitrate (IMDUR) 30 MG 24 hr tablet Take 1 tablet (30 mg total) by mouth daily. 30 tablet 3  . LEVEMIR FLEXTOUCH 100 UNIT/ML Pen Inject 13 Units as directed 2 (two) times a week.  0  . NOVOLOG FLEXPEN 100 UNIT/ML FlexPen CBG 70 - 120: 0 units  CBG 121 - 150: 1 unit  CBG 151 - 200: 2 units  CBG 201 - 250: 3 units  CBG 251 - 300: 5 units  CBG 301 - 350: 7 units  CBG 351 - 400: 9 units  0  . polyethylene glycol (MIRALAX) packet Take 17 g by mouth daily. 30 each 0  . pravastatin (PRAVACHOL) 40 MG tablet Take 40 mg by mouth daily.    . pregabalin (LYRICA) 75 MG capsule Take 75 mg by  mouth 2 (two) times daily.    . QUEtiapine (SEROQUEL) 25 MG tablet Take 1 tablet (25 mg total) by mouth daily. 30 tablet 0  . tamsulosin (FLOMAX) 0.4 MG CAPS capsule Take 0.4 mg by mouth daily.    [name] warfarin (COUMADIN) 7.5 MG tablet Take 1 tablet (7.5 mg total) by mouth daily at 6 PM. 30 tablet 0   No current facility-administered medications for this visit.     Allergies  Allergen Reactions  . Oxycodone Other (See Comments)    Hallucinations    Social History   Social History  . Marital status: Married    Spouse name: N/A  . Number of children: 3  . Years of education: N/A   Occupational History  . Retired-[employer]    Social History Main Topics  . Smoking status: Former Smoker    Packs/day: 1.00    Years: 30.00    Types: Cigarettes    Quit date: 04/28/1991  . Smokeless tobacco: Never Used  . Alcohol use Yes     Comment: 04/17/2014 "might have a glass of wine at Christmas"  . Drug use: No  . Sexual activity: No   Other Topics Concern  . Not on file   Social History Narrative  . No narrative on file    Family History  Problem Relation Age of Onset  . Hyperlipidemia Mother   . Hypertension Mother   . Cancer Mother   . Hyperlipidemia Father   . Hypertension Father   . Kidney disease Father   . Heart disease Sister   . Alzheimer's disease Sister   . Cancer Brother   . Other Sister   . Coronary artery disease Neg Hx   . Anesthesia problems Neg Hx   . Hypotension Neg Hx   . Malignant hyperthermia Neg Hx   . Pseudochol deficiency Neg Hx     Review of Systems:  As stated in the HPI and otherwise negative.   BP (!) 150/52   Pulse 79   Ht 5' 8"  (1.727 m)   SpO2 98%   Physical Examination: General: Well developed, well nourished, NAD  HEENT: OP clear, mucus membranes  moist  SKIN: warm, dry. No rashes. Neuro: No focal deficits  Musculoskeletal: Muscle strength 5/5 all ext  Psychiatric: Mood and affect normal  Neck: No JVD, no carotid bruits, no  thyromegaly, no lymphadenopathy.  Lungs:Clear bilaterally, no wheezes, rhonci, crackles Cardiovascular: Regular rate and rhythm. No murmurs, gallops or rubs. Abdomen:Soft. Bowel sounds present. Non-tender.  Extremities: Bilateral BKA. No edema in LE.   Echo 04/11/16: - Left ventricle: Septal and posterior lateral wall hypokinesis The   cavity size was mildly dilated. There was mild focal basal   hypertrophy of the septum. Systolic function was moderately   reduced. The estimated ejection fraction was in the range of 35%   to 40%. Doppler parameters are consistent with abnormal left   ventricular relaxation (grade 1 diastolic dysfunction). - Aortic valve: Likely trileaflet but not well seen - Atrial septum: No defect or patent foramen ovale was identified.  EKG:  EKG is not ordered today. The ekg ordered today demonstrates   Recent Labs: 06/28/2016: B Natriuretic Peptide >4,500.0 07/05/2016: BUN 68; Creatinine, Ser 8.67; Potassium 5.4; Sodium 124 07/06/2016: Hemoglobin 10.4; Platelets 239   Lipid Panel    Component Value Date/Time   CHOL 237 (H) 04/10/2015 1535   TRIG 228 (H) 04/10/2015 1535   HDL 34 (L) 04/10/2015 1535   CHOLHDL 7.0 (H) 04/10/2015 1535   CHOLHDL 6.1 10/01/2014 0148   VLDL 32 10/01/2014 0148   LDLCALC 157 (H) 04/10/2015 1535     Wt Readings from Last 3 Encounters:  07/06/16 167 lb 8.8 oz (76 kg)  04/27/16 159 lb (72.1 kg)  04/13/16 160 lb 11.5 oz (72.9 kg)     Other studies Reviewed: Additional studies/ records that were reviewed today include: . Review of the above records demonstrates:   Assessment and Plan:   1. CAD s/p CABG: All grafts patent by cath April 2014. No chest pain suggestive of angina. Continue ASA, beta blocker, Imdur, statin.  2. Ischemic cardiomyopathy: LVEF=35-40% by echo April 2018. Continue beta blocker and hydralazine.   3. Chronic systolic CHF: Volume managed by dialysis. (M,W,F)  4. Atrial fib, paroxysmal: sinus today. Continue  beta blocker and coumadin.   Current medicines are reviewed at length with the patient today.  The patient does not have concerns regarding medicines.  The following changes have been made:  no change  Labs/ tests ordered today include:  No orders of the defined types were placed in this encounter.    Disposition:   FU with me in 6 months   Signed, [name], MD 08/05/2016 10:43 AM    [facility] [address], [place], [place]  [address] Phone: [phone]; Fax: [phone]

## 2016-08-06 DIAGNOSIS — E119 Type 2 diabetes mellitus without complications: Secondary | ICD-10-CM | POA: Diagnosis not present

## 2016-08-06 DIAGNOSIS — E161 Other hypoglycemia: Secondary | ICD-10-CM | POA: Diagnosis not present

## 2016-08-06 DIAGNOSIS — N186 End stage renal disease: Secondary | ICD-10-CM | POA: Diagnosis not present

## 2016-08-06 DIAGNOSIS — D631 Anemia in chronic kidney disease: Secondary | ICD-10-CM | POA: Diagnosis not present

## 2016-08-06 DIAGNOSIS — N2581 Secondary hyperparathyroidism of renal origin: Secondary | ICD-10-CM | POA: Diagnosis not present

## 2016-08-06 DIAGNOSIS — R739 Hyperglycemia, unspecified: Secondary | ICD-10-CM | POA: Diagnosis not present

## 2016-08-09 DIAGNOSIS — N2581 Secondary hyperparathyroidism of renal origin: Secondary | ICD-10-CM | POA: Diagnosis not present

## 2016-08-09 DIAGNOSIS — E119 Type 2 diabetes mellitus without complications: Secondary | ICD-10-CM | POA: Diagnosis not present

## 2016-08-09 DIAGNOSIS — N186 End stage renal disease: Secondary | ICD-10-CM | POA: Diagnosis not present

## 2016-08-09 DIAGNOSIS — E161 Other hypoglycemia: Secondary | ICD-10-CM | POA: Diagnosis not present

## 2016-08-09 DIAGNOSIS — R739 Hyperglycemia, unspecified: Secondary | ICD-10-CM | POA: Diagnosis not present

## 2016-08-09 DIAGNOSIS — D631 Anemia in chronic kidney disease: Secondary | ICD-10-CM | POA: Diagnosis not present

## 2016-08-10 DIAGNOSIS — E1122 Type 2 diabetes mellitus with diabetic chronic kidney disease: Secondary | ICD-10-CM | POA: Diagnosis not present

## 2016-08-10 DIAGNOSIS — E1165 Type 2 diabetes mellitus with hyperglycemia: Secondary | ICD-10-CM | POA: Diagnosis not present

## 2016-08-10 DIAGNOSIS — Z992 Dependence on renal dialysis: Secondary | ICD-10-CM | POA: Diagnosis not present

## 2016-08-10 DIAGNOSIS — D631 Anemia in chronic kidney disease: Secondary | ICD-10-CM | POA: Diagnosis not present

## 2016-08-10 DIAGNOSIS — I5042 Chronic combined systolic (congestive) and diastolic (congestive) heart failure: Secondary | ICD-10-CM | POA: Diagnosis not present

## 2016-08-10 DIAGNOSIS — I132 Hypertensive heart and chronic kidney disease with heart failure and with stage 5 chronic kidney disease, or end stage renal disease: Secondary | ICD-10-CM | POA: Diagnosis not present

## 2016-08-10 DIAGNOSIS — N186 End stage renal disease: Secondary | ICD-10-CM | POA: Diagnosis not present

## 2016-08-11 DIAGNOSIS — E161 Other hypoglycemia: Secondary | ICD-10-CM | POA: Diagnosis not present

## 2016-08-11 DIAGNOSIS — E119 Type 2 diabetes mellitus without complications: Secondary | ICD-10-CM | POA: Diagnosis not present

## 2016-08-11 DIAGNOSIS — N186 End stage renal disease: Secondary | ICD-10-CM | POA: Diagnosis not present

## 2016-08-11 DIAGNOSIS — R739 Hyperglycemia, unspecified: Secondary | ICD-10-CM | POA: Diagnosis not present

## 2016-08-11 DIAGNOSIS — N2581 Secondary hyperparathyroidism of renal origin: Secondary | ICD-10-CM | POA: Diagnosis not present

## 2016-08-12 DIAGNOSIS — D631 Anemia in chronic kidney disease: Secondary | ICD-10-CM | POA: Diagnosis not present

## 2016-08-12 DIAGNOSIS — N186 End stage renal disease: Secondary | ICD-10-CM | POA: Diagnosis not present

## 2016-08-12 DIAGNOSIS — E1122 Type 2 diabetes mellitus with diabetic chronic kidney disease: Secondary | ICD-10-CM | POA: Diagnosis not present

## 2016-08-12 DIAGNOSIS — I132 Hypertensive heart and chronic kidney disease with heart failure and with stage 5 chronic kidney disease, or end stage renal disease: Secondary | ICD-10-CM | POA: Diagnosis not present

## 2016-08-12 DIAGNOSIS — E1165 Type 2 diabetes mellitus with hyperglycemia: Secondary | ICD-10-CM | POA: Diagnosis not present

## 2016-08-12 DIAGNOSIS — I5042 Chronic combined systolic (congestive) and diastolic (congestive) heart failure: Secondary | ICD-10-CM | POA: Diagnosis not present

## 2016-08-13 DIAGNOSIS — N186 End stage renal disease: Secondary | ICD-10-CM | POA: Diagnosis not present

## 2016-08-13 DIAGNOSIS — E161 Other hypoglycemia: Secondary | ICD-10-CM | POA: Diagnosis not present

## 2016-08-13 DIAGNOSIS — E119 Type 2 diabetes mellitus without complications: Secondary | ICD-10-CM | POA: Diagnosis not present

## 2016-08-13 DIAGNOSIS — R739 Hyperglycemia, unspecified: Secondary | ICD-10-CM | POA: Diagnosis not present

## 2016-08-13 DIAGNOSIS — N2581 Secondary hyperparathyroidism of renal origin: Secondary | ICD-10-CM | POA: Diagnosis not present

## 2016-08-16 DIAGNOSIS — R739 Hyperglycemia, unspecified: Secondary | ICD-10-CM | POA: Diagnosis not present

## 2016-08-16 DIAGNOSIS — E161 Other hypoglycemia: Secondary | ICD-10-CM | POA: Diagnosis not present

## 2016-08-16 DIAGNOSIS — E119 Type 2 diabetes mellitus without complications: Secondary | ICD-10-CM | POA: Diagnosis not present

## 2016-08-16 DIAGNOSIS — N2581 Secondary hyperparathyroidism of renal origin: Secondary | ICD-10-CM | POA: Diagnosis not present

## 2016-08-16 DIAGNOSIS — N186 End stage renal disease: Secondary | ICD-10-CM | POA: Diagnosis not present

## 2016-08-17 DIAGNOSIS — I132 Hypertensive heart and chronic kidney disease with heart failure and with stage 5 chronic kidney disease, or end stage renal disease: Secondary | ICD-10-CM | POA: Diagnosis not present

## 2016-08-17 DIAGNOSIS — I5042 Chronic combined systolic (congestive) and diastolic (congestive) heart failure: Secondary | ICD-10-CM | POA: Diagnosis not present

## 2016-08-17 DIAGNOSIS — N186 End stage renal disease: Secondary | ICD-10-CM | POA: Diagnosis not present

## 2016-08-17 DIAGNOSIS — D631 Anemia in chronic kidney disease: Secondary | ICD-10-CM | POA: Diagnosis not present

## 2016-08-17 DIAGNOSIS — E1122 Type 2 diabetes mellitus with diabetic chronic kidney disease: Secondary | ICD-10-CM | POA: Diagnosis not present

## 2016-08-17 DIAGNOSIS — E1165 Type 2 diabetes mellitus with hyperglycemia: Secondary | ICD-10-CM | POA: Diagnosis not present

## 2016-08-18 DIAGNOSIS — N186 End stage renal disease: Secondary | ICD-10-CM | POA: Diagnosis not present

## 2016-08-18 DIAGNOSIS — E161 Other hypoglycemia: Secondary | ICD-10-CM | POA: Diagnosis not present

## 2016-08-18 DIAGNOSIS — R739 Hyperglycemia, unspecified: Secondary | ICD-10-CM | POA: Diagnosis not present

## 2016-08-18 DIAGNOSIS — E119 Type 2 diabetes mellitus without complications: Secondary | ICD-10-CM | POA: Diagnosis not present

## 2016-08-18 DIAGNOSIS — N2581 Secondary hyperparathyroidism of renal origin: Secondary | ICD-10-CM | POA: Diagnosis not present

## 2016-08-19 DIAGNOSIS — I132 Hypertensive heart and chronic kidney disease with heart failure and with stage 5 chronic kidney disease, or end stage renal disease: Secondary | ICD-10-CM | POA: Diagnosis not present

## 2016-08-19 DIAGNOSIS — E1165 Type 2 diabetes mellitus with hyperglycemia: Secondary | ICD-10-CM | POA: Diagnosis not present

## 2016-08-19 DIAGNOSIS — E1122 Type 2 diabetes mellitus with diabetic chronic kidney disease: Secondary | ICD-10-CM | POA: Diagnosis not present

## 2016-08-19 DIAGNOSIS — N186 End stage renal disease: Secondary | ICD-10-CM | POA: Diagnosis not present

## 2016-08-19 DIAGNOSIS — I5042 Chronic combined systolic (congestive) and diastolic (congestive) heart failure: Secondary | ICD-10-CM | POA: Diagnosis not present

## 2016-08-19 DIAGNOSIS — D631 Anemia in chronic kidney disease: Secondary | ICD-10-CM | POA: Diagnosis not present

## 2016-08-20 DIAGNOSIS — E161 Other hypoglycemia: Secondary | ICD-10-CM | POA: Diagnosis not present

## 2016-08-20 DIAGNOSIS — R739 Hyperglycemia, unspecified: Secondary | ICD-10-CM | POA: Diagnosis not present

## 2016-08-20 DIAGNOSIS — E119 Type 2 diabetes mellitus without complications: Secondary | ICD-10-CM | POA: Diagnosis not present

## 2016-08-20 DIAGNOSIS — N186 End stage renal disease: Secondary | ICD-10-CM | POA: Diagnosis not present

## 2016-08-20 DIAGNOSIS — N2581 Secondary hyperparathyroidism of renal origin: Secondary | ICD-10-CM | POA: Diagnosis not present

## 2016-08-23 DIAGNOSIS — E161 Other hypoglycemia: Secondary | ICD-10-CM | POA: Diagnosis not present

## 2016-08-23 DIAGNOSIS — N2581 Secondary hyperparathyroidism of renal origin: Secondary | ICD-10-CM | POA: Diagnosis not present

## 2016-08-23 DIAGNOSIS — R739 Hyperglycemia, unspecified: Secondary | ICD-10-CM | POA: Diagnosis not present

## 2016-08-23 DIAGNOSIS — N186 End stage renal disease: Secondary | ICD-10-CM | POA: Diagnosis not present

## 2016-08-23 DIAGNOSIS — E119 Type 2 diabetes mellitus without complications: Secondary | ICD-10-CM | POA: Diagnosis not present

## 2016-08-24 DIAGNOSIS — E1165 Type 2 diabetes mellitus with hyperglycemia: Secondary | ICD-10-CM | POA: Diagnosis not present

## 2016-08-24 DIAGNOSIS — D631 Anemia in chronic kidney disease: Secondary | ICD-10-CM | POA: Diagnosis not present

## 2016-08-24 DIAGNOSIS — I132 Hypertensive heart and chronic kidney disease with heart failure and with stage 5 chronic kidney disease, or end stage renal disease: Secondary | ICD-10-CM | POA: Diagnosis not present

## 2016-08-24 DIAGNOSIS — N186 End stage renal disease: Secondary | ICD-10-CM | POA: Diagnosis not present

## 2016-08-24 DIAGNOSIS — E1122 Type 2 diabetes mellitus with diabetic chronic kidney disease: Secondary | ICD-10-CM | POA: Diagnosis not present

## 2016-08-24 DIAGNOSIS — I5042 Chronic combined systolic (congestive) and diastolic (congestive) heart failure: Secondary | ICD-10-CM | POA: Diagnosis not present

## 2016-08-25 DIAGNOSIS — I132 Hypertensive heart and chronic kidney disease with heart failure and with stage 5 chronic kidney disease, or end stage renal disease: Secondary | ICD-10-CM | POA: Diagnosis not present

## 2016-08-25 DIAGNOSIS — E1122 Type 2 diabetes mellitus with diabetic chronic kidney disease: Secondary | ICD-10-CM | POA: Diagnosis not present

## 2016-08-25 DIAGNOSIS — R739 Hyperglycemia, unspecified: Secondary | ICD-10-CM | POA: Diagnosis not present

## 2016-08-25 DIAGNOSIS — E119 Type 2 diabetes mellitus without complications: Secondary | ICD-10-CM | POA: Diagnosis not present

## 2016-08-25 DIAGNOSIS — I5042 Chronic combined systolic (congestive) and diastolic (congestive) heart failure: Secondary | ICD-10-CM | POA: Diagnosis not present

## 2016-08-25 DIAGNOSIS — E161 Other hypoglycemia: Secondary | ICD-10-CM | POA: Diagnosis not present

## 2016-08-25 DIAGNOSIS — E1165 Type 2 diabetes mellitus with hyperglycemia: Secondary | ICD-10-CM | POA: Diagnosis not present

## 2016-08-25 DIAGNOSIS — D631 Anemia in chronic kidney disease: Secondary | ICD-10-CM | POA: Diagnosis not present

## 2016-08-25 DIAGNOSIS — N186 End stage renal disease: Secondary | ICD-10-CM | POA: Diagnosis not present

## 2016-08-25 DIAGNOSIS — N2581 Secondary hyperparathyroidism of renal origin: Secondary | ICD-10-CM | POA: Diagnosis not present

## 2016-08-26 DIAGNOSIS — E1122 Type 2 diabetes mellitus with diabetic chronic kidney disease: Secondary | ICD-10-CM | POA: Diagnosis not present

## 2016-08-26 DIAGNOSIS — D631 Anemia in chronic kidney disease: Secondary | ICD-10-CM | POA: Diagnosis not present

## 2016-08-26 DIAGNOSIS — I5042 Chronic combined systolic (congestive) and diastolic (congestive) heart failure: Secondary | ICD-10-CM | POA: Diagnosis not present

## 2016-08-26 DIAGNOSIS — I132 Hypertensive heart and chronic kidney disease with heart failure and with stage 5 chronic kidney disease, or end stage renal disease: Secondary | ICD-10-CM | POA: Diagnosis not present

## 2016-08-26 DIAGNOSIS — N186 End stage renal disease: Secondary | ICD-10-CM | POA: Diagnosis not present

## 2016-08-26 DIAGNOSIS — E1165 Type 2 diabetes mellitus with hyperglycemia: Secondary | ICD-10-CM | POA: Diagnosis not present

## 2016-08-27 DIAGNOSIS — D631 Anemia in chronic kidney disease: Secondary | ICD-10-CM | POA: Diagnosis not present

## 2016-08-27 DIAGNOSIS — N186 End stage renal disease: Secondary | ICD-10-CM | POA: Diagnosis not present

## 2016-08-27 DIAGNOSIS — E119 Type 2 diabetes mellitus without complications: Secondary | ICD-10-CM | POA: Diagnosis not present

## 2016-08-27 DIAGNOSIS — N2581 Secondary hyperparathyroidism of renal origin: Secondary | ICD-10-CM | POA: Diagnosis not present

## 2016-08-27 DIAGNOSIS — E161 Other hypoglycemia: Secondary | ICD-10-CM | POA: Diagnosis not present

## 2016-08-27 DIAGNOSIS — R739 Hyperglycemia, unspecified: Secondary | ICD-10-CM | POA: Diagnosis not present

## 2016-08-27 DIAGNOSIS — E1165 Type 2 diabetes mellitus with hyperglycemia: Secondary | ICD-10-CM | POA: Diagnosis not present

## 2016-08-27 DIAGNOSIS — I132 Hypertensive heart and chronic kidney disease with heart failure and with stage 5 chronic kidney disease, or end stage renal disease: Secondary | ICD-10-CM | POA: Diagnosis not present

## 2016-08-27 DIAGNOSIS — I5042 Chronic combined systolic (congestive) and diastolic (congestive) heart failure: Secondary | ICD-10-CM | POA: Diagnosis not present

## 2016-08-27 DIAGNOSIS — E1122 Type 2 diabetes mellitus with diabetic chronic kidney disease: Secondary | ICD-10-CM | POA: Diagnosis not present

## 2016-08-30 DIAGNOSIS — E119 Type 2 diabetes mellitus without complications: Secondary | ICD-10-CM | POA: Diagnosis not present

## 2016-08-30 DIAGNOSIS — E161 Other hypoglycemia: Secondary | ICD-10-CM | POA: Diagnosis not present

## 2016-08-30 DIAGNOSIS — N2581 Secondary hyperparathyroidism of renal origin: Secondary | ICD-10-CM | POA: Diagnosis not present

## 2016-08-30 DIAGNOSIS — N186 End stage renal disease: Secondary | ICD-10-CM | POA: Diagnosis not present

## 2016-08-30 DIAGNOSIS — R739 Hyperglycemia, unspecified: Secondary | ICD-10-CM | POA: Diagnosis not present

## 2016-08-31 DIAGNOSIS — N186 End stage renal disease: Secondary | ICD-10-CM | POA: Diagnosis not present

## 2016-08-31 DIAGNOSIS — D631 Anemia in chronic kidney disease: Secondary | ICD-10-CM | POA: Diagnosis not present

## 2016-08-31 DIAGNOSIS — I132 Hypertensive heart and chronic kidney disease with heart failure and with stage 5 chronic kidney disease, or end stage renal disease: Secondary | ICD-10-CM | POA: Diagnosis not present

## 2016-08-31 DIAGNOSIS — E1122 Type 2 diabetes mellitus with diabetic chronic kidney disease: Secondary | ICD-10-CM | POA: Diagnosis not present

## 2016-08-31 DIAGNOSIS — E1165 Type 2 diabetes mellitus with hyperglycemia: Secondary | ICD-10-CM | POA: Diagnosis not present

## 2016-08-31 DIAGNOSIS — I5042 Chronic combined systolic (congestive) and diastolic (congestive) heart failure: Secondary | ICD-10-CM | POA: Diagnosis not present

## 2016-09-01 DIAGNOSIS — E161 Other hypoglycemia: Secondary | ICD-10-CM | POA: Diagnosis not present

## 2016-09-01 DIAGNOSIS — N2581 Secondary hyperparathyroidism of renal origin: Secondary | ICD-10-CM | POA: Diagnosis not present

## 2016-09-01 DIAGNOSIS — N186 End stage renal disease: Secondary | ICD-10-CM | POA: Diagnosis not present

## 2016-09-01 DIAGNOSIS — R739 Hyperglycemia, unspecified: Secondary | ICD-10-CM | POA: Diagnosis not present

## 2016-09-01 DIAGNOSIS — E119 Type 2 diabetes mellitus without complications: Secondary | ICD-10-CM | POA: Diagnosis not present

## 2016-09-02 DIAGNOSIS — I5042 Chronic combined systolic (congestive) and diastolic (congestive) heart failure: Secondary | ICD-10-CM | POA: Diagnosis not present

## 2016-09-02 DIAGNOSIS — N186 End stage renal disease: Secondary | ICD-10-CM | POA: Diagnosis not present

## 2016-09-02 DIAGNOSIS — E1122 Type 2 diabetes mellitus with diabetic chronic kidney disease: Secondary | ICD-10-CM | POA: Diagnosis not present

## 2016-09-02 DIAGNOSIS — E1165 Type 2 diabetes mellitus with hyperglycemia: Secondary | ICD-10-CM | POA: Diagnosis not present

## 2016-09-02 DIAGNOSIS — I132 Hypertensive heart and chronic kidney disease with heart failure and with stage 5 chronic kidney disease, or end stage renal disease: Secondary | ICD-10-CM | POA: Diagnosis not present

## 2016-09-02 DIAGNOSIS — D631 Anemia in chronic kidney disease: Secondary | ICD-10-CM | POA: Diagnosis not present

## 2016-09-03 DIAGNOSIS — R739 Hyperglycemia, unspecified: Secondary | ICD-10-CM | POA: Diagnosis not present

## 2016-09-03 DIAGNOSIS — N2581 Secondary hyperparathyroidism of renal origin: Secondary | ICD-10-CM | POA: Diagnosis not present

## 2016-09-03 DIAGNOSIS — E161 Other hypoglycemia: Secondary | ICD-10-CM | POA: Diagnosis not present

## 2016-09-03 DIAGNOSIS — E119 Type 2 diabetes mellitus without complications: Secondary | ICD-10-CM | POA: Diagnosis not present

## 2016-09-03 DIAGNOSIS — N186 End stage renal disease: Secondary | ICD-10-CM | POA: Diagnosis not present

## 2016-09-06 DIAGNOSIS — R739 Hyperglycemia, unspecified: Secondary | ICD-10-CM | POA: Diagnosis not present

## 2016-09-06 DIAGNOSIS — N186 End stage renal disease: Secondary | ICD-10-CM | POA: Diagnosis not present

## 2016-09-06 DIAGNOSIS — N2581 Secondary hyperparathyroidism of renal origin: Secondary | ICD-10-CM | POA: Diagnosis not present

## 2016-09-06 DIAGNOSIS — E119 Type 2 diabetes mellitus without complications: Secondary | ICD-10-CM | POA: Diagnosis not present

## 2016-09-06 DIAGNOSIS — E161 Other hypoglycemia: Secondary | ICD-10-CM | POA: Diagnosis not present

## 2016-09-07 DIAGNOSIS — D631 Anemia in chronic kidney disease: Secondary | ICD-10-CM | POA: Diagnosis not present

## 2016-09-07 DIAGNOSIS — E1122 Type 2 diabetes mellitus with diabetic chronic kidney disease: Secondary | ICD-10-CM | POA: Diagnosis not present

## 2016-09-07 DIAGNOSIS — I132 Hypertensive heart and chronic kidney disease with heart failure and with stage 5 chronic kidney disease, or end stage renal disease: Secondary | ICD-10-CM | POA: Diagnosis not present

## 2016-09-07 DIAGNOSIS — N186 End stage renal disease: Secondary | ICD-10-CM | POA: Diagnosis not present

## 2016-09-07 DIAGNOSIS — I5042 Chronic combined systolic (congestive) and diastolic (congestive) heart failure: Secondary | ICD-10-CM | POA: Diagnosis not present

## 2016-09-07 DIAGNOSIS — E1165 Type 2 diabetes mellitus with hyperglycemia: Secondary | ICD-10-CM | POA: Diagnosis not present

## 2016-09-08 DIAGNOSIS — N2581 Secondary hyperparathyroidism of renal origin: Secondary | ICD-10-CM | POA: Diagnosis not present

## 2016-09-08 DIAGNOSIS — E161 Other hypoglycemia: Secondary | ICD-10-CM | POA: Diagnosis not present

## 2016-09-08 DIAGNOSIS — R739 Hyperglycemia, unspecified: Secondary | ICD-10-CM | POA: Diagnosis not present

## 2016-09-08 DIAGNOSIS — N186 End stage renal disease: Secondary | ICD-10-CM | POA: Diagnosis not present

## 2016-09-08 DIAGNOSIS — E119 Type 2 diabetes mellitus without complications: Secondary | ICD-10-CM | POA: Diagnosis not present

## 2016-09-10 DIAGNOSIS — N2581 Secondary hyperparathyroidism of renal origin: Secondary | ICD-10-CM | POA: Diagnosis not present

## 2016-09-10 DIAGNOSIS — E1122 Type 2 diabetes mellitus with diabetic chronic kidney disease: Secondary | ICD-10-CM | POA: Diagnosis not present

## 2016-09-10 DIAGNOSIS — Z992 Dependence on renal dialysis: Secondary | ICD-10-CM | POA: Diagnosis not present

## 2016-09-10 DIAGNOSIS — E161 Other hypoglycemia: Secondary | ICD-10-CM | POA: Diagnosis not present

## 2016-09-10 DIAGNOSIS — R739 Hyperglycemia, unspecified: Secondary | ICD-10-CM | POA: Diagnosis not present

## 2016-09-10 DIAGNOSIS — E119 Type 2 diabetes mellitus without complications: Secondary | ICD-10-CM | POA: Diagnosis not present

## 2016-09-10 DIAGNOSIS — N186 End stage renal disease: Secondary | ICD-10-CM | POA: Diagnosis not present

## 2016-09-13 DIAGNOSIS — N2581 Secondary hyperparathyroidism of renal origin: Secondary | ICD-10-CM | POA: Diagnosis not present

## 2016-09-13 DIAGNOSIS — E119 Type 2 diabetes mellitus without complications: Secondary | ICD-10-CM | POA: Diagnosis not present

## 2016-09-13 DIAGNOSIS — N186 End stage renal disease: Secondary | ICD-10-CM | POA: Diagnosis not present

## 2016-09-13 DIAGNOSIS — Z23 Encounter for immunization: Secondary | ICD-10-CM | POA: Diagnosis not present

## 2016-09-13 DIAGNOSIS — R739 Hyperglycemia, unspecified: Secondary | ICD-10-CM | POA: Diagnosis not present

## 2016-09-13 DIAGNOSIS — E161 Other hypoglycemia: Secondary | ICD-10-CM | POA: Diagnosis not present

## 2016-09-15 DIAGNOSIS — N2581 Secondary hyperparathyroidism of renal origin: Secondary | ICD-10-CM | POA: Diagnosis not present

## 2016-09-15 DIAGNOSIS — E119 Type 2 diabetes mellitus without complications: Secondary | ICD-10-CM | POA: Diagnosis not present

## 2016-09-15 DIAGNOSIS — Z23 Encounter for immunization: Secondary | ICD-10-CM | POA: Diagnosis not present

## 2016-09-15 DIAGNOSIS — R739 Hyperglycemia, unspecified: Secondary | ICD-10-CM | POA: Diagnosis not present

## 2016-09-15 DIAGNOSIS — N186 End stage renal disease: Secondary | ICD-10-CM | POA: Diagnosis not present

## 2016-09-15 DIAGNOSIS — E161 Other hypoglycemia: Secondary | ICD-10-CM | POA: Diagnosis not present

## 2016-09-17 DIAGNOSIS — E119 Type 2 diabetes mellitus without complications: Secondary | ICD-10-CM | POA: Diagnosis not present

## 2016-09-17 DIAGNOSIS — N186 End stage renal disease: Secondary | ICD-10-CM | POA: Diagnosis not present

## 2016-09-17 DIAGNOSIS — Z23 Encounter for immunization: Secondary | ICD-10-CM | POA: Diagnosis not present

## 2016-09-17 DIAGNOSIS — E161 Other hypoglycemia: Secondary | ICD-10-CM | POA: Diagnosis not present

## 2016-09-17 DIAGNOSIS — N2581 Secondary hyperparathyroidism of renal origin: Secondary | ICD-10-CM | POA: Diagnosis not present

## 2016-09-17 DIAGNOSIS — R739 Hyperglycemia, unspecified: Secondary | ICD-10-CM | POA: Diagnosis not present

## 2016-09-20 DIAGNOSIS — Z23 Encounter for immunization: Secondary | ICD-10-CM | POA: Diagnosis not present

## 2016-09-20 DIAGNOSIS — R739 Hyperglycemia, unspecified: Secondary | ICD-10-CM | POA: Diagnosis not present

## 2016-09-20 DIAGNOSIS — E119 Type 2 diabetes mellitus without complications: Secondary | ICD-10-CM | POA: Diagnosis not present

## 2016-09-20 DIAGNOSIS — E161 Other hypoglycemia: Secondary | ICD-10-CM | POA: Diagnosis not present

## 2016-09-20 DIAGNOSIS — N2581 Secondary hyperparathyroidism of renal origin: Secondary | ICD-10-CM | POA: Diagnosis not present

## 2016-09-20 DIAGNOSIS — N186 End stage renal disease: Secondary | ICD-10-CM | POA: Diagnosis not present

## 2016-09-22 DIAGNOSIS — N186 End stage renal disease: Secondary | ICD-10-CM | POA: Diagnosis not present

## 2016-09-22 DIAGNOSIS — Z23 Encounter for immunization: Secondary | ICD-10-CM | POA: Diagnosis not present

## 2016-09-22 DIAGNOSIS — R739 Hyperglycemia, unspecified: Secondary | ICD-10-CM | POA: Diagnosis not present

## 2016-09-22 DIAGNOSIS — E119 Type 2 diabetes mellitus without complications: Secondary | ICD-10-CM | POA: Diagnosis not present

## 2016-09-22 DIAGNOSIS — N2581 Secondary hyperparathyroidism of renal origin: Secondary | ICD-10-CM | POA: Diagnosis not present

## 2016-09-22 DIAGNOSIS — E161 Other hypoglycemia: Secondary | ICD-10-CM | POA: Diagnosis not present

## 2016-09-24 DIAGNOSIS — E119 Type 2 diabetes mellitus without complications: Secondary | ICD-10-CM | POA: Diagnosis not present

## 2016-09-24 DIAGNOSIS — E161 Other hypoglycemia: Secondary | ICD-10-CM | POA: Diagnosis not present

## 2016-09-24 DIAGNOSIS — Z23 Encounter for immunization: Secondary | ICD-10-CM | POA: Diagnosis not present

## 2016-09-24 DIAGNOSIS — R739 Hyperglycemia, unspecified: Secondary | ICD-10-CM | POA: Diagnosis not present

## 2016-09-24 DIAGNOSIS — N186 End stage renal disease: Secondary | ICD-10-CM | POA: Diagnosis not present

## 2016-09-24 DIAGNOSIS — N2581 Secondary hyperparathyroidism of renal origin: Secondary | ICD-10-CM | POA: Diagnosis not present

## 2016-09-27 DIAGNOSIS — E161 Other hypoglycemia: Secondary | ICD-10-CM | POA: Diagnosis not present

## 2016-09-27 DIAGNOSIS — N186 End stage renal disease: Secondary | ICD-10-CM | POA: Diagnosis not present

## 2016-09-27 DIAGNOSIS — R739 Hyperglycemia, unspecified: Secondary | ICD-10-CM | POA: Diagnosis not present

## 2016-09-27 DIAGNOSIS — E119 Type 2 diabetes mellitus without complications: Secondary | ICD-10-CM | POA: Diagnosis not present

## 2016-09-27 DIAGNOSIS — Z23 Encounter for immunization: Secondary | ICD-10-CM | POA: Diagnosis not present

## 2016-09-27 DIAGNOSIS — N2581 Secondary hyperparathyroidism of renal origin: Secondary | ICD-10-CM | POA: Diagnosis not present

## 2016-09-29 DIAGNOSIS — E119 Type 2 diabetes mellitus without complications: Secondary | ICD-10-CM | POA: Diagnosis not present

## 2016-09-29 DIAGNOSIS — N2581 Secondary hyperparathyroidism of renal origin: Secondary | ICD-10-CM | POA: Diagnosis not present

## 2016-09-29 DIAGNOSIS — R739 Hyperglycemia, unspecified: Secondary | ICD-10-CM | POA: Diagnosis not present

## 2016-09-29 DIAGNOSIS — Z23 Encounter for immunization: Secondary | ICD-10-CM | POA: Diagnosis not present

## 2016-09-29 DIAGNOSIS — E161 Other hypoglycemia: Secondary | ICD-10-CM | POA: Diagnosis not present

## 2016-09-29 DIAGNOSIS — N186 End stage renal disease: Secondary | ICD-10-CM | POA: Diagnosis not present

## 2016-10-01 DIAGNOSIS — E119 Type 2 diabetes mellitus without complications: Secondary | ICD-10-CM | POA: Diagnosis not present

## 2016-10-01 DIAGNOSIS — N2581 Secondary hyperparathyroidism of renal origin: Secondary | ICD-10-CM | POA: Diagnosis not present

## 2016-10-01 DIAGNOSIS — Z23 Encounter for immunization: Secondary | ICD-10-CM | POA: Diagnosis not present

## 2016-10-01 DIAGNOSIS — E161 Other hypoglycemia: Secondary | ICD-10-CM | POA: Diagnosis not present

## 2016-10-01 DIAGNOSIS — N186 End stage renal disease: Secondary | ICD-10-CM | POA: Diagnosis not present

## 2016-10-01 DIAGNOSIS — R739 Hyperglycemia, unspecified: Secondary | ICD-10-CM | POA: Diagnosis not present

## 2016-10-04 DIAGNOSIS — Z23 Encounter for immunization: Secondary | ICD-10-CM | POA: Diagnosis not present

## 2016-10-04 DIAGNOSIS — E161 Other hypoglycemia: Secondary | ICD-10-CM | POA: Diagnosis not present

## 2016-10-04 DIAGNOSIS — N2581 Secondary hyperparathyroidism of renal origin: Secondary | ICD-10-CM | POA: Diagnosis not present

## 2016-10-04 DIAGNOSIS — E119 Type 2 diabetes mellitus without complications: Secondary | ICD-10-CM | POA: Diagnosis not present

## 2016-10-04 DIAGNOSIS — N186 End stage renal disease: Secondary | ICD-10-CM | POA: Diagnosis not present

## 2016-10-04 DIAGNOSIS — R739 Hyperglycemia, unspecified: Secondary | ICD-10-CM | POA: Diagnosis not present

## 2016-10-06 DIAGNOSIS — E119 Type 2 diabetes mellitus without complications: Secondary | ICD-10-CM | POA: Diagnosis not present

## 2016-10-06 DIAGNOSIS — N186 End stage renal disease: Secondary | ICD-10-CM | POA: Diagnosis not present

## 2016-10-06 DIAGNOSIS — E161 Other hypoglycemia: Secondary | ICD-10-CM | POA: Diagnosis not present

## 2016-10-06 DIAGNOSIS — Z23 Encounter for immunization: Secondary | ICD-10-CM | POA: Diagnosis not present

## 2016-10-06 DIAGNOSIS — N2581 Secondary hyperparathyroidism of renal origin: Secondary | ICD-10-CM | POA: Diagnosis not present

## 2016-10-06 DIAGNOSIS — R739 Hyperglycemia, unspecified: Secondary | ICD-10-CM | POA: Diagnosis not present

## 2016-10-08 DIAGNOSIS — R739 Hyperglycemia, unspecified: Secondary | ICD-10-CM | POA: Diagnosis not present

## 2016-10-08 DIAGNOSIS — E119 Type 2 diabetes mellitus without complications: Secondary | ICD-10-CM | POA: Diagnosis not present

## 2016-10-08 DIAGNOSIS — E161 Other hypoglycemia: Secondary | ICD-10-CM | POA: Diagnosis not present

## 2016-10-08 DIAGNOSIS — N2581 Secondary hyperparathyroidism of renal origin: Secondary | ICD-10-CM | POA: Diagnosis not present

## 2016-10-08 DIAGNOSIS — Z23 Encounter for immunization: Secondary | ICD-10-CM | POA: Diagnosis not present

## 2016-10-08 DIAGNOSIS — N186 End stage renal disease: Secondary | ICD-10-CM | POA: Diagnosis not present

## 2016-10-10 DIAGNOSIS — N186 End stage renal disease: Secondary | ICD-10-CM | POA: Diagnosis not present

## 2016-10-10 DIAGNOSIS — Z992 Dependence on renal dialysis: Secondary | ICD-10-CM | POA: Diagnosis not present

## 2016-10-10 DIAGNOSIS — E1122 Type 2 diabetes mellitus with diabetic chronic kidney disease: Secondary | ICD-10-CM | POA: Diagnosis not present

## 2016-10-11 DIAGNOSIS — D509 Iron deficiency anemia, unspecified: Secondary | ICD-10-CM | POA: Diagnosis not present

## 2016-10-11 DIAGNOSIS — E161 Other hypoglycemia: Secondary | ICD-10-CM | POA: Diagnosis not present

## 2016-10-11 DIAGNOSIS — N2581 Secondary hyperparathyroidism of renal origin: Secondary | ICD-10-CM | POA: Diagnosis not present

## 2016-10-11 DIAGNOSIS — E119 Type 2 diabetes mellitus without complications: Secondary | ICD-10-CM | POA: Diagnosis not present

## 2016-10-11 DIAGNOSIS — N186 End stage renal disease: Secondary | ICD-10-CM | POA: Diagnosis not present

## 2016-10-11 DIAGNOSIS — D631 Anemia in chronic kidney disease: Secondary | ICD-10-CM | POA: Diagnosis not present

## 2016-10-11 DIAGNOSIS — R739 Hyperglycemia, unspecified: Secondary | ICD-10-CM | POA: Diagnosis not present

## 2016-10-13 DIAGNOSIS — R739 Hyperglycemia, unspecified: Secondary | ICD-10-CM | POA: Diagnosis not present

## 2016-10-13 DIAGNOSIS — Z7901 Long term (current) use of anticoagulants: Secondary | ICD-10-CM | POA: Diagnosis not present

## 2016-10-13 DIAGNOSIS — D509 Iron deficiency anemia, unspecified: Secondary | ICD-10-CM | POA: Diagnosis not present

## 2016-10-13 DIAGNOSIS — N2581 Secondary hyperparathyroidism of renal origin: Secondary | ICD-10-CM | POA: Diagnosis not present

## 2016-10-13 DIAGNOSIS — Z5181 Encounter for therapeutic drug level monitoring: Secondary | ICD-10-CM | POA: Diagnosis not present

## 2016-10-13 DIAGNOSIS — I4891 Unspecified atrial fibrillation: Secondary | ICD-10-CM | POA: Diagnosis not present

## 2016-10-13 DIAGNOSIS — E119 Type 2 diabetes mellitus without complications: Secondary | ICD-10-CM | POA: Diagnosis not present

## 2016-10-13 DIAGNOSIS — N186 End stage renal disease: Secondary | ICD-10-CM | POA: Diagnosis not present

## 2016-10-13 DIAGNOSIS — E161 Other hypoglycemia: Secondary | ICD-10-CM | POA: Diagnosis not present

## 2016-10-15 DIAGNOSIS — N2581 Secondary hyperparathyroidism of renal origin: Secondary | ICD-10-CM | POA: Diagnosis not present

## 2016-10-15 DIAGNOSIS — R739 Hyperglycemia, unspecified: Secondary | ICD-10-CM | POA: Diagnosis not present

## 2016-10-15 DIAGNOSIS — D509 Iron deficiency anemia, unspecified: Secondary | ICD-10-CM | POA: Diagnosis not present

## 2016-10-15 DIAGNOSIS — E119 Type 2 diabetes mellitus without complications: Secondary | ICD-10-CM | POA: Diagnosis not present

## 2016-10-15 DIAGNOSIS — N186 End stage renal disease: Secondary | ICD-10-CM | POA: Diagnosis not present

## 2016-10-15 DIAGNOSIS — E161 Other hypoglycemia: Secondary | ICD-10-CM | POA: Diagnosis not present

## 2016-10-18 DIAGNOSIS — D509 Iron deficiency anemia, unspecified: Secondary | ICD-10-CM | POA: Diagnosis not present

## 2016-10-18 DIAGNOSIS — R739 Hyperglycemia, unspecified: Secondary | ICD-10-CM | POA: Diagnosis not present

## 2016-10-18 DIAGNOSIS — N2581 Secondary hyperparathyroidism of renal origin: Secondary | ICD-10-CM | POA: Diagnosis not present

## 2016-10-18 DIAGNOSIS — N186 End stage renal disease: Secondary | ICD-10-CM | POA: Diagnosis not present

## 2016-10-18 DIAGNOSIS — E119 Type 2 diabetes mellitus without complications: Secondary | ICD-10-CM | POA: Diagnosis not present

## 2016-10-18 DIAGNOSIS — E161 Other hypoglycemia: Secondary | ICD-10-CM | POA: Diagnosis not present

## 2016-10-20 DIAGNOSIS — D509 Iron deficiency anemia, unspecified: Secondary | ICD-10-CM | POA: Diagnosis not present

## 2016-10-20 DIAGNOSIS — Z7901 Long term (current) use of anticoagulants: Secondary | ICD-10-CM | POA: Diagnosis not present

## 2016-10-20 DIAGNOSIS — I4891 Unspecified atrial fibrillation: Secondary | ICD-10-CM | POA: Diagnosis not present

## 2016-10-20 DIAGNOSIS — Z5181 Encounter for therapeutic drug level monitoring: Secondary | ICD-10-CM | POA: Diagnosis not present

## 2016-10-20 DIAGNOSIS — E161 Other hypoglycemia: Secondary | ICD-10-CM | POA: Diagnosis not present

## 2016-10-20 DIAGNOSIS — N186 End stage renal disease: Secondary | ICD-10-CM | POA: Diagnosis not present

## 2016-10-20 DIAGNOSIS — E119 Type 2 diabetes mellitus without complications: Secondary | ICD-10-CM | POA: Diagnosis not present

## 2016-10-20 DIAGNOSIS — N2581 Secondary hyperparathyroidism of renal origin: Secondary | ICD-10-CM | POA: Diagnosis not present

## 2016-10-20 DIAGNOSIS — R739 Hyperglycemia, unspecified: Secondary | ICD-10-CM | POA: Diagnosis not present

## 2016-10-22 DIAGNOSIS — N186 End stage renal disease: Secondary | ICD-10-CM | POA: Diagnosis not present

## 2016-10-22 DIAGNOSIS — R739 Hyperglycemia, unspecified: Secondary | ICD-10-CM | POA: Diagnosis not present

## 2016-10-22 DIAGNOSIS — N2581 Secondary hyperparathyroidism of renal origin: Secondary | ICD-10-CM | POA: Diagnosis not present

## 2016-10-22 DIAGNOSIS — D509 Iron deficiency anemia, unspecified: Secondary | ICD-10-CM | POA: Diagnosis not present

## 2016-10-22 DIAGNOSIS — E119 Type 2 diabetes mellitus without complications: Secondary | ICD-10-CM | POA: Diagnosis not present

## 2016-10-22 DIAGNOSIS — E161 Other hypoglycemia: Secondary | ICD-10-CM | POA: Diagnosis not present

## 2016-10-25 DIAGNOSIS — E119 Type 2 diabetes mellitus without complications: Secondary | ICD-10-CM | POA: Diagnosis not present

## 2016-10-25 DIAGNOSIS — N186 End stage renal disease: Secondary | ICD-10-CM | POA: Diagnosis not present

## 2016-10-25 DIAGNOSIS — N2581 Secondary hyperparathyroidism of renal origin: Secondary | ICD-10-CM | POA: Diagnosis not present

## 2016-10-25 DIAGNOSIS — D509 Iron deficiency anemia, unspecified: Secondary | ICD-10-CM | POA: Diagnosis not present

## 2016-10-25 DIAGNOSIS — E161 Other hypoglycemia: Secondary | ICD-10-CM | POA: Diagnosis not present

## 2016-10-25 DIAGNOSIS — R739 Hyperglycemia, unspecified: Secondary | ICD-10-CM | POA: Diagnosis not present

## 2016-10-27 DIAGNOSIS — N186 End stage renal disease: Secondary | ICD-10-CM | POA: Diagnosis not present

## 2016-10-27 DIAGNOSIS — E161 Other hypoglycemia: Secondary | ICD-10-CM | POA: Diagnosis not present

## 2016-10-27 DIAGNOSIS — R739 Hyperglycemia, unspecified: Secondary | ICD-10-CM | POA: Diagnosis not present

## 2016-10-27 DIAGNOSIS — E119 Type 2 diabetes mellitus without complications: Secondary | ICD-10-CM | POA: Diagnosis not present

## 2016-10-27 DIAGNOSIS — D509 Iron deficiency anemia, unspecified: Secondary | ICD-10-CM | POA: Diagnosis not present

## 2016-10-27 DIAGNOSIS — N2581 Secondary hyperparathyroidism of renal origin: Secondary | ICD-10-CM | POA: Diagnosis not present

## 2016-10-29 DIAGNOSIS — E161 Other hypoglycemia: Secondary | ICD-10-CM | POA: Diagnosis not present

## 2016-10-29 DIAGNOSIS — N186 End stage renal disease: Secondary | ICD-10-CM | POA: Diagnosis not present

## 2016-10-29 DIAGNOSIS — D509 Iron deficiency anemia, unspecified: Secondary | ICD-10-CM | POA: Diagnosis not present

## 2016-10-29 DIAGNOSIS — N2581 Secondary hyperparathyroidism of renal origin: Secondary | ICD-10-CM | POA: Diagnosis not present

## 2016-10-29 DIAGNOSIS — E119 Type 2 diabetes mellitus without complications: Secondary | ICD-10-CM | POA: Diagnosis not present

## 2016-10-29 DIAGNOSIS — R739 Hyperglycemia, unspecified: Secondary | ICD-10-CM | POA: Diagnosis not present

## 2016-11-01 DIAGNOSIS — E119 Type 2 diabetes mellitus without complications: Secondary | ICD-10-CM | POA: Diagnosis not present

## 2016-11-01 DIAGNOSIS — R739 Hyperglycemia, unspecified: Secondary | ICD-10-CM | POA: Diagnosis not present

## 2016-11-01 DIAGNOSIS — N2581 Secondary hyperparathyroidism of renal origin: Secondary | ICD-10-CM | POA: Diagnosis not present

## 2016-11-01 DIAGNOSIS — D509 Iron deficiency anemia, unspecified: Secondary | ICD-10-CM | POA: Diagnosis not present

## 2016-11-01 DIAGNOSIS — E161 Other hypoglycemia: Secondary | ICD-10-CM | POA: Diagnosis not present

## 2016-11-01 DIAGNOSIS — N186 End stage renal disease: Secondary | ICD-10-CM | POA: Diagnosis not present

## 2016-11-03 DIAGNOSIS — E119 Type 2 diabetes mellitus without complications: Secondary | ICD-10-CM | POA: Diagnosis not present

## 2016-11-03 DIAGNOSIS — R739 Hyperglycemia, unspecified: Secondary | ICD-10-CM | POA: Diagnosis not present

## 2016-11-03 DIAGNOSIS — D509 Iron deficiency anemia, unspecified: Secondary | ICD-10-CM | POA: Diagnosis not present

## 2016-11-03 DIAGNOSIS — E161 Other hypoglycemia: Secondary | ICD-10-CM | POA: Diagnosis not present

## 2016-11-03 DIAGNOSIS — N2581 Secondary hyperparathyroidism of renal origin: Secondary | ICD-10-CM | POA: Diagnosis not present

## 2016-11-03 DIAGNOSIS — N186 End stage renal disease: Secondary | ICD-10-CM | POA: Diagnosis not present

## 2016-11-04 ENCOUNTER — Encounter (HOSPITAL_COMMUNITY): Payer: Medicare Other

## 2016-11-04 ENCOUNTER — Ambulatory Visit: Payer: Medicare Other | Admitting: Vascular Surgery

## 2016-11-05 DIAGNOSIS — R739 Hyperglycemia, unspecified: Secondary | ICD-10-CM | POA: Diagnosis not present

## 2016-11-05 DIAGNOSIS — E119 Type 2 diabetes mellitus without complications: Secondary | ICD-10-CM | POA: Diagnosis not present

## 2016-11-05 DIAGNOSIS — E161 Other hypoglycemia: Secondary | ICD-10-CM | POA: Diagnosis not present

## 2016-11-05 DIAGNOSIS — N186 End stage renal disease: Secondary | ICD-10-CM | POA: Diagnosis not present

## 2016-11-05 DIAGNOSIS — D509 Iron deficiency anemia, unspecified: Secondary | ICD-10-CM | POA: Diagnosis not present

## 2016-11-05 DIAGNOSIS — N2581 Secondary hyperparathyroidism of renal origin: Secondary | ICD-10-CM | POA: Diagnosis not present

## 2016-11-08 DIAGNOSIS — E161 Other hypoglycemia: Secondary | ICD-10-CM | POA: Diagnosis not present

## 2016-11-08 DIAGNOSIS — N2581 Secondary hyperparathyroidism of renal origin: Secondary | ICD-10-CM | POA: Diagnosis not present

## 2016-11-08 DIAGNOSIS — D509 Iron deficiency anemia, unspecified: Secondary | ICD-10-CM | POA: Diagnosis not present

## 2016-11-08 DIAGNOSIS — N186 End stage renal disease: Secondary | ICD-10-CM | POA: Diagnosis not present

## 2016-11-08 DIAGNOSIS — R739 Hyperglycemia, unspecified: Secondary | ICD-10-CM | POA: Diagnosis not present

## 2016-11-08 DIAGNOSIS — E119 Type 2 diabetes mellitus without complications: Secondary | ICD-10-CM | POA: Diagnosis not present

## 2016-11-10 DIAGNOSIS — N2581 Secondary hyperparathyroidism of renal origin: Secondary | ICD-10-CM | POA: Diagnosis not present

## 2016-11-10 DIAGNOSIS — N186 End stage renal disease: Secondary | ICD-10-CM | POA: Diagnosis not present

## 2016-11-10 DIAGNOSIS — R739 Hyperglycemia, unspecified: Secondary | ICD-10-CM | POA: Diagnosis not present

## 2016-11-10 DIAGNOSIS — E1122 Type 2 diabetes mellitus with diabetic chronic kidney disease: Secondary | ICD-10-CM | POA: Diagnosis not present

## 2016-11-10 DIAGNOSIS — E119 Type 2 diabetes mellitus without complications: Secondary | ICD-10-CM | POA: Diagnosis not present

## 2016-11-10 DIAGNOSIS — Z992 Dependence on renal dialysis: Secondary | ICD-10-CM | POA: Diagnosis not present

## 2016-11-10 DIAGNOSIS — E161 Other hypoglycemia: Secondary | ICD-10-CM | POA: Diagnosis not present

## 2016-11-10 DIAGNOSIS — D509 Iron deficiency anemia, unspecified: Secondary | ICD-10-CM | POA: Diagnosis not present

## 2016-11-15 DIAGNOSIS — R739 Hyperglycemia, unspecified: Secondary | ICD-10-CM | POA: Diagnosis not present

## 2016-11-15 DIAGNOSIS — Z7901 Long term (current) use of anticoagulants: Secondary | ICD-10-CM | POA: Diagnosis not present

## 2016-11-15 DIAGNOSIS — N186 End stage renal disease: Secondary | ICD-10-CM | POA: Diagnosis not present

## 2016-11-15 DIAGNOSIS — D631 Anemia in chronic kidney disease: Secondary | ICD-10-CM | POA: Diagnosis not present

## 2016-11-15 DIAGNOSIS — D509 Iron deficiency anemia, unspecified: Secondary | ICD-10-CM | POA: Diagnosis not present

## 2016-11-15 DIAGNOSIS — E119 Type 2 diabetes mellitus without complications: Secondary | ICD-10-CM | POA: Diagnosis not present

## 2016-11-15 DIAGNOSIS — N2581 Secondary hyperparathyroidism of renal origin: Secondary | ICD-10-CM | POA: Diagnosis not present

## 2016-11-15 DIAGNOSIS — E161 Other hypoglycemia: Secondary | ICD-10-CM | POA: Diagnosis not present

## 2016-11-15 DIAGNOSIS — I4891 Unspecified atrial fibrillation: Secondary | ICD-10-CM | POA: Diagnosis not present

## 2016-11-17 DIAGNOSIS — E119 Type 2 diabetes mellitus without complications: Secondary | ICD-10-CM | POA: Diagnosis not present

## 2016-11-17 DIAGNOSIS — R739 Hyperglycemia, unspecified: Secondary | ICD-10-CM | POA: Diagnosis not present

## 2016-11-17 DIAGNOSIS — N186 End stage renal disease: Secondary | ICD-10-CM | POA: Diagnosis not present

## 2016-11-17 DIAGNOSIS — E161 Other hypoglycemia: Secondary | ICD-10-CM | POA: Diagnosis not present

## 2016-11-17 DIAGNOSIS — N2581 Secondary hyperparathyroidism of renal origin: Secondary | ICD-10-CM | POA: Diagnosis not present

## 2016-11-17 DIAGNOSIS — D509 Iron deficiency anemia, unspecified: Secondary | ICD-10-CM | POA: Diagnosis not present

## 2016-11-19 DIAGNOSIS — R739 Hyperglycemia, unspecified: Secondary | ICD-10-CM | POA: Diagnosis not present

## 2016-11-19 DIAGNOSIS — E119 Type 2 diabetes mellitus without complications: Secondary | ICD-10-CM | POA: Diagnosis not present

## 2016-11-19 DIAGNOSIS — E161 Other hypoglycemia: Secondary | ICD-10-CM | POA: Diagnosis not present

## 2016-11-19 DIAGNOSIS — N186 End stage renal disease: Secondary | ICD-10-CM | POA: Diagnosis not present

## 2016-11-19 DIAGNOSIS — N2581 Secondary hyperparathyroidism of renal origin: Secondary | ICD-10-CM | POA: Diagnosis not present

## 2016-11-19 DIAGNOSIS — D509 Iron deficiency anemia, unspecified: Secondary | ICD-10-CM | POA: Diagnosis not present

## 2016-11-22 DIAGNOSIS — R739 Hyperglycemia, unspecified: Secondary | ICD-10-CM | POA: Diagnosis not present

## 2016-11-22 DIAGNOSIS — N186 End stage renal disease: Secondary | ICD-10-CM | POA: Diagnosis not present

## 2016-11-22 DIAGNOSIS — D509 Iron deficiency anemia, unspecified: Secondary | ICD-10-CM | POA: Diagnosis not present

## 2016-11-22 DIAGNOSIS — E161 Other hypoglycemia: Secondary | ICD-10-CM | POA: Diagnosis not present

## 2016-11-22 DIAGNOSIS — E119 Type 2 diabetes mellitus without complications: Secondary | ICD-10-CM | POA: Diagnosis not present

## 2016-11-22 DIAGNOSIS — N2581 Secondary hyperparathyroidism of renal origin: Secondary | ICD-10-CM | POA: Diagnosis not present

## 2016-11-24 DIAGNOSIS — Z7901 Long term (current) use of anticoagulants: Secondary | ICD-10-CM | POA: Diagnosis not present

## 2016-11-24 DIAGNOSIS — N186 End stage renal disease: Secondary | ICD-10-CM | POA: Diagnosis not present

## 2016-11-24 DIAGNOSIS — E161 Other hypoglycemia: Secondary | ICD-10-CM | POA: Diagnosis not present

## 2016-11-24 DIAGNOSIS — E119 Type 2 diabetes mellitus without complications: Secondary | ICD-10-CM | POA: Diagnosis not present

## 2016-11-24 DIAGNOSIS — R739 Hyperglycemia, unspecified: Secondary | ICD-10-CM | POA: Diagnosis not present

## 2016-11-24 DIAGNOSIS — I4891 Unspecified atrial fibrillation: Secondary | ICD-10-CM | POA: Diagnosis not present

## 2016-11-24 DIAGNOSIS — N2581 Secondary hyperparathyroidism of renal origin: Secondary | ICD-10-CM | POA: Diagnosis not present

## 2016-11-24 DIAGNOSIS — D509 Iron deficiency anemia, unspecified: Secondary | ICD-10-CM | POA: Diagnosis not present

## 2016-11-26 DIAGNOSIS — E161 Other hypoglycemia: Secondary | ICD-10-CM | POA: Diagnosis not present

## 2016-11-26 DIAGNOSIS — N2581 Secondary hyperparathyroidism of renal origin: Secondary | ICD-10-CM | POA: Diagnosis not present

## 2016-11-26 DIAGNOSIS — N186 End stage renal disease: Secondary | ICD-10-CM | POA: Diagnosis not present

## 2016-11-26 DIAGNOSIS — R739 Hyperglycemia, unspecified: Secondary | ICD-10-CM | POA: Diagnosis not present

## 2016-11-26 DIAGNOSIS — E119 Type 2 diabetes mellitus without complications: Secondary | ICD-10-CM | POA: Diagnosis not present

## 2016-11-26 DIAGNOSIS — D509 Iron deficiency anemia, unspecified: Secondary | ICD-10-CM | POA: Diagnosis not present

## 2016-11-28 DIAGNOSIS — N186 End stage renal disease: Secondary | ICD-10-CM | POA: Diagnosis not present

## 2016-11-28 DIAGNOSIS — D509 Iron deficiency anemia, unspecified: Secondary | ICD-10-CM | POA: Diagnosis not present

## 2016-11-28 DIAGNOSIS — Z7901 Long term (current) use of anticoagulants: Secondary | ICD-10-CM | POA: Diagnosis not present

## 2016-11-28 DIAGNOSIS — E119 Type 2 diabetes mellitus without complications: Secondary | ICD-10-CM | POA: Diagnosis not present

## 2016-11-28 DIAGNOSIS — R739 Hyperglycemia, unspecified: Secondary | ICD-10-CM | POA: Diagnosis not present

## 2016-11-28 DIAGNOSIS — N2581 Secondary hyperparathyroidism of renal origin: Secondary | ICD-10-CM | POA: Diagnosis not present

## 2016-11-28 DIAGNOSIS — I4891 Unspecified atrial fibrillation: Secondary | ICD-10-CM | POA: Diagnosis not present

## 2016-11-28 DIAGNOSIS — E161 Other hypoglycemia: Secondary | ICD-10-CM | POA: Diagnosis not present

## 2016-11-30 DIAGNOSIS — N186 End stage renal disease: Secondary | ICD-10-CM | POA: Diagnosis not present

## 2016-11-30 DIAGNOSIS — E161 Other hypoglycemia: Secondary | ICD-10-CM | POA: Diagnosis not present

## 2016-11-30 DIAGNOSIS — R739 Hyperglycemia, unspecified: Secondary | ICD-10-CM | POA: Diagnosis not present

## 2016-11-30 DIAGNOSIS — D509 Iron deficiency anemia, unspecified: Secondary | ICD-10-CM | POA: Diagnosis not present

## 2016-11-30 DIAGNOSIS — E119 Type 2 diabetes mellitus without complications: Secondary | ICD-10-CM | POA: Diagnosis not present

## 2016-11-30 DIAGNOSIS — N2581 Secondary hyperparathyroidism of renal origin: Secondary | ICD-10-CM | POA: Diagnosis not present

## 2016-12-03 DIAGNOSIS — E119 Type 2 diabetes mellitus without complications: Secondary | ICD-10-CM | POA: Diagnosis not present

## 2016-12-03 DIAGNOSIS — N186 End stage renal disease: Secondary | ICD-10-CM | POA: Diagnosis not present

## 2016-12-03 DIAGNOSIS — R739 Hyperglycemia, unspecified: Secondary | ICD-10-CM | POA: Diagnosis not present

## 2016-12-03 DIAGNOSIS — D509 Iron deficiency anemia, unspecified: Secondary | ICD-10-CM | POA: Diagnosis not present

## 2016-12-03 DIAGNOSIS — N2581 Secondary hyperparathyroidism of renal origin: Secondary | ICD-10-CM | POA: Diagnosis not present

## 2016-12-03 DIAGNOSIS — E161 Other hypoglycemia: Secondary | ICD-10-CM | POA: Diagnosis not present

## 2016-12-06 DIAGNOSIS — N2581 Secondary hyperparathyroidism of renal origin: Secondary | ICD-10-CM | POA: Diagnosis not present

## 2016-12-06 DIAGNOSIS — E119 Type 2 diabetes mellitus without complications: Secondary | ICD-10-CM | POA: Diagnosis not present

## 2016-12-06 DIAGNOSIS — D509 Iron deficiency anemia, unspecified: Secondary | ICD-10-CM | POA: Diagnosis not present

## 2016-12-06 DIAGNOSIS — E161 Other hypoglycemia: Secondary | ICD-10-CM | POA: Diagnosis not present

## 2016-12-06 DIAGNOSIS — R739 Hyperglycemia, unspecified: Secondary | ICD-10-CM | POA: Diagnosis not present

## 2016-12-06 DIAGNOSIS — N186 End stage renal disease: Secondary | ICD-10-CM | POA: Diagnosis not present

## 2016-12-08 DIAGNOSIS — N2581 Secondary hyperparathyroidism of renal origin: Secondary | ICD-10-CM | POA: Diagnosis not present

## 2016-12-08 DIAGNOSIS — N186 End stage renal disease: Secondary | ICD-10-CM | POA: Diagnosis not present

## 2016-12-08 DIAGNOSIS — E161 Other hypoglycemia: Secondary | ICD-10-CM | POA: Diagnosis not present

## 2016-12-08 DIAGNOSIS — R739 Hyperglycemia, unspecified: Secondary | ICD-10-CM | POA: Diagnosis not present

## 2016-12-08 DIAGNOSIS — D509 Iron deficiency anemia, unspecified: Secondary | ICD-10-CM | POA: Diagnosis not present

## 2016-12-08 DIAGNOSIS — E119 Type 2 diabetes mellitus without complications: Secondary | ICD-10-CM | POA: Diagnosis not present

## 2016-12-10 DIAGNOSIS — Z992 Dependence on renal dialysis: Secondary | ICD-10-CM | POA: Diagnosis not present

## 2016-12-10 DIAGNOSIS — N2581 Secondary hyperparathyroidism of renal origin: Secondary | ICD-10-CM | POA: Diagnosis not present

## 2016-12-10 DIAGNOSIS — E119 Type 2 diabetes mellitus without complications: Secondary | ICD-10-CM | POA: Diagnosis not present

## 2016-12-10 DIAGNOSIS — R739 Hyperglycemia, unspecified: Secondary | ICD-10-CM | POA: Diagnosis not present

## 2016-12-10 DIAGNOSIS — E1122 Type 2 diabetes mellitus with diabetic chronic kidney disease: Secondary | ICD-10-CM | POA: Diagnosis not present

## 2016-12-10 DIAGNOSIS — D509 Iron deficiency anemia, unspecified: Secondary | ICD-10-CM | POA: Diagnosis not present

## 2016-12-10 DIAGNOSIS — N186 End stage renal disease: Secondary | ICD-10-CM | POA: Diagnosis not present

## 2016-12-10 DIAGNOSIS — E161 Other hypoglycemia: Secondary | ICD-10-CM | POA: Diagnosis not present

## 2016-12-13 DIAGNOSIS — E119 Type 2 diabetes mellitus without complications: Secondary | ICD-10-CM | POA: Diagnosis not present

## 2016-12-13 DIAGNOSIS — R739 Hyperglycemia, unspecified: Secondary | ICD-10-CM | POA: Diagnosis not present

## 2016-12-13 DIAGNOSIS — D509 Iron deficiency anemia, unspecified: Secondary | ICD-10-CM | POA: Diagnosis not present

## 2016-12-13 DIAGNOSIS — N186 End stage renal disease: Secondary | ICD-10-CM | POA: Diagnosis not present

## 2016-12-13 DIAGNOSIS — N2581 Secondary hyperparathyroidism of renal origin: Secondary | ICD-10-CM | POA: Diagnosis not present

## 2016-12-15 DIAGNOSIS — N2581 Secondary hyperparathyroidism of renal origin: Secondary | ICD-10-CM | POA: Diagnosis not present

## 2016-12-15 DIAGNOSIS — N186 End stage renal disease: Secondary | ICD-10-CM | POA: Diagnosis not present

## 2016-12-15 DIAGNOSIS — R739 Hyperglycemia, unspecified: Secondary | ICD-10-CM | POA: Diagnosis not present

## 2016-12-15 DIAGNOSIS — D509 Iron deficiency anemia, unspecified: Secondary | ICD-10-CM | POA: Diagnosis not present

## 2016-12-15 DIAGNOSIS — E119 Type 2 diabetes mellitus without complications: Secondary | ICD-10-CM | POA: Diagnosis not present

## 2016-12-17 DIAGNOSIS — N186 End stage renal disease: Secondary | ICD-10-CM | POA: Diagnosis not present

## 2016-12-17 DIAGNOSIS — N2581 Secondary hyperparathyroidism of renal origin: Secondary | ICD-10-CM | POA: Diagnosis not present

## 2016-12-17 DIAGNOSIS — R739 Hyperglycemia, unspecified: Secondary | ICD-10-CM | POA: Diagnosis not present

## 2016-12-17 DIAGNOSIS — E119 Type 2 diabetes mellitus without complications: Secondary | ICD-10-CM | POA: Diagnosis not present

## 2016-12-17 DIAGNOSIS — D509 Iron deficiency anemia, unspecified: Secondary | ICD-10-CM | POA: Diagnosis not present

## 2016-12-22 DIAGNOSIS — E119 Type 2 diabetes mellitus without complications: Secondary | ICD-10-CM | POA: Diagnosis not present

## 2016-12-22 DIAGNOSIS — N2581 Secondary hyperparathyroidism of renal origin: Secondary | ICD-10-CM | POA: Diagnosis not present

## 2016-12-22 DIAGNOSIS — R739 Hyperglycemia, unspecified: Secondary | ICD-10-CM | POA: Diagnosis not present

## 2016-12-22 DIAGNOSIS — D509 Iron deficiency anemia, unspecified: Secondary | ICD-10-CM | POA: Diagnosis not present

## 2016-12-22 DIAGNOSIS — N186 End stage renal disease: Secondary | ICD-10-CM | POA: Diagnosis not present

## 2016-12-24 DIAGNOSIS — N186 End stage renal disease: Secondary | ICD-10-CM | POA: Diagnosis not present

## 2016-12-24 DIAGNOSIS — R739 Hyperglycemia, unspecified: Secondary | ICD-10-CM | POA: Diagnosis not present

## 2016-12-24 DIAGNOSIS — D509 Iron deficiency anemia, unspecified: Secondary | ICD-10-CM | POA: Diagnosis not present

## 2016-12-24 DIAGNOSIS — E119 Type 2 diabetes mellitus without complications: Secondary | ICD-10-CM | POA: Diagnosis not present

## 2016-12-24 DIAGNOSIS — N2581 Secondary hyperparathyroidism of renal origin: Secondary | ICD-10-CM | POA: Diagnosis not present

## 2016-12-27 DIAGNOSIS — N2581 Secondary hyperparathyroidism of renal origin: Secondary | ICD-10-CM | POA: Diagnosis not present

## 2016-12-27 DIAGNOSIS — E119 Type 2 diabetes mellitus without complications: Secondary | ICD-10-CM | POA: Diagnosis not present

## 2016-12-27 DIAGNOSIS — N186 End stage renal disease: Secondary | ICD-10-CM | POA: Diagnosis not present

## 2016-12-27 DIAGNOSIS — D509 Iron deficiency anemia, unspecified: Secondary | ICD-10-CM | POA: Diagnosis not present

## 2016-12-27 DIAGNOSIS — R739 Hyperglycemia, unspecified: Secondary | ICD-10-CM | POA: Diagnosis not present

## 2016-12-29 DIAGNOSIS — N2581 Secondary hyperparathyroidism of renal origin: Secondary | ICD-10-CM | POA: Diagnosis not present

## 2016-12-29 DIAGNOSIS — Z7901 Long term (current) use of anticoagulants: Secondary | ICD-10-CM | POA: Diagnosis not present

## 2016-12-29 DIAGNOSIS — N186 End stage renal disease: Secondary | ICD-10-CM | POA: Diagnosis not present

## 2016-12-29 DIAGNOSIS — D509 Iron deficiency anemia, unspecified: Secondary | ICD-10-CM | POA: Diagnosis not present

## 2016-12-29 DIAGNOSIS — I4891 Unspecified atrial fibrillation: Secondary | ICD-10-CM | POA: Diagnosis not present

## 2016-12-29 DIAGNOSIS — E119 Type 2 diabetes mellitus without complications: Secondary | ICD-10-CM | POA: Diagnosis not present

## 2016-12-29 DIAGNOSIS — R739 Hyperglycemia, unspecified: Secondary | ICD-10-CM | POA: Diagnosis not present

## 2016-12-31 DIAGNOSIS — R739 Hyperglycemia, unspecified: Secondary | ICD-10-CM | POA: Diagnosis not present

## 2016-12-31 DIAGNOSIS — D509 Iron deficiency anemia, unspecified: Secondary | ICD-10-CM | POA: Diagnosis not present

## 2016-12-31 DIAGNOSIS — N2581 Secondary hyperparathyroidism of renal origin: Secondary | ICD-10-CM | POA: Diagnosis not present

## 2016-12-31 DIAGNOSIS — N186 End stage renal disease: Secondary | ICD-10-CM | POA: Diagnosis not present

## 2016-12-31 DIAGNOSIS — E119 Type 2 diabetes mellitus without complications: Secondary | ICD-10-CM | POA: Diagnosis not present

## 2017-01-02 DIAGNOSIS — E119 Type 2 diabetes mellitus without complications: Secondary | ICD-10-CM | POA: Diagnosis not present

## 2017-01-02 DIAGNOSIS — I4891 Unspecified atrial fibrillation: Secondary | ICD-10-CM | POA: Diagnosis not present

## 2017-01-02 DIAGNOSIS — N186 End stage renal disease: Secondary | ICD-10-CM | POA: Diagnosis not present

## 2017-01-02 DIAGNOSIS — Z7901 Long term (current) use of anticoagulants: Secondary | ICD-10-CM | POA: Diagnosis not present

## 2017-01-02 DIAGNOSIS — R739 Hyperglycemia, unspecified: Secondary | ICD-10-CM | POA: Diagnosis not present

## 2017-01-02 DIAGNOSIS — D509 Iron deficiency anemia, unspecified: Secondary | ICD-10-CM | POA: Diagnosis not present

## 2017-01-02 DIAGNOSIS — N2581 Secondary hyperparathyroidism of renal origin: Secondary | ICD-10-CM | POA: Diagnosis not present

## 2017-01-05 DIAGNOSIS — E119 Type 2 diabetes mellitus without complications: Secondary | ICD-10-CM | POA: Diagnosis not present

## 2017-01-05 DIAGNOSIS — N2581 Secondary hyperparathyroidism of renal origin: Secondary | ICD-10-CM | POA: Diagnosis not present

## 2017-01-05 DIAGNOSIS — R739 Hyperglycemia, unspecified: Secondary | ICD-10-CM | POA: Diagnosis not present

## 2017-01-05 DIAGNOSIS — N186 End stage renal disease: Secondary | ICD-10-CM | POA: Diagnosis not present

## 2017-01-05 DIAGNOSIS — D509 Iron deficiency anemia, unspecified: Secondary | ICD-10-CM | POA: Diagnosis not present

## 2017-01-07 DIAGNOSIS — D509 Iron deficiency anemia, unspecified: Secondary | ICD-10-CM | POA: Diagnosis not present

## 2017-01-07 DIAGNOSIS — E119 Type 2 diabetes mellitus without complications: Secondary | ICD-10-CM | POA: Diagnosis not present

## 2017-01-07 DIAGNOSIS — N186 End stage renal disease: Secondary | ICD-10-CM | POA: Diagnosis not present

## 2017-01-07 DIAGNOSIS — N2581 Secondary hyperparathyroidism of renal origin: Secondary | ICD-10-CM | POA: Diagnosis not present

## 2017-01-07 DIAGNOSIS — R739 Hyperglycemia, unspecified: Secondary | ICD-10-CM | POA: Diagnosis not present

## 2017-01-09 DIAGNOSIS — N186 End stage renal disease: Secondary | ICD-10-CM | POA: Diagnosis not present

## 2017-01-09 DIAGNOSIS — D509 Iron deficiency anemia, unspecified: Secondary | ICD-10-CM | POA: Diagnosis not present

## 2017-01-09 DIAGNOSIS — R739 Hyperglycemia, unspecified: Secondary | ICD-10-CM | POA: Diagnosis not present

## 2017-01-09 DIAGNOSIS — E119 Type 2 diabetes mellitus without complications: Secondary | ICD-10-CM | POA: Diagnosis not present

## 2017-01-09 DIAGNOSIS — Z7901 Long term (current) use of anticoagulants: Secondary | ICD-10-CM | POA: Diagnosis not present

## 2017-01-09 DIAGNOSIS — N2581 Secondary hyperparathyroidism of renal origin: Secondary | ICD-10-CM | POA: Diagnosis not present

## 2017-01-09 DIAGNOSIS — I4891 Unspecified atrial fibrillation: Secondary | ICD-10-CM | POA: Diagnosis not present

## 2017-01-10 DIAGNOSIS — E1122 Type 2 diabetes mellitus with diabetic chronic kidney disease: Secondary | ICD-10-CM | POA: Diagnosis not present

## 2017-01-10 DIAGNOSIS — N186 End stage renal disease: Secondary | ICD-10-CM | POA: Diagnosis not present

## 2017-01-10 DIAGNOSIS — Z992 Dependence on renal dialysis: Secondary | ICD-10-CM | POA: Diagnosis not present

## 2017-01-12 DIAGNOSIS — E161 Other hypoglycemia: Secondary | ICD-10-CM | POA: Diagnosis not present

## 2017-01-12 DIAGNOSIS — D509 Iron deficiency anemia, unspecified: Secondary | ICD-10-CM | POA: Diagnosis not present

## 2017-01-12 DIAGNOSIS — R739 Hyperglycemia, unspecified: Secondary | ICD-10-CM | POA: Diagnosis not present

## 2017-01-12 DIAGNOSIS — N186 End stage renal disease: Secondary | ICD-10-CM | POA: Diagnosis not present

## 2017-01-12 DIAGNOSIS — E119 Type 2 diabetes mellitus without complications: Secondary | ICD-10-CM | POA: Diagnosis not present

## 2017-01-12 DIAGNOSIS — N2581 Secondary hyperparathyroidism of renal origin: Secondary | ICD-10-CM | POA: Diagnosis not present

## 2017-01-14 DIAGNOSIS — N2581 Secondary hyperparathyroidism of renal origin: Secondary | ICD-10-CM | POA: Diagnosis not present

## 2017-01-14 DIAGNOSIS — E161 Other hypoglycemia: Secondary | ICD-10-CM | POA: Diagnosis not present

## 2017-01-14 DIAGNOSIS — N186 End stage renal disease: Secondary | ICD-10-CM | POA: Diagnosis not present

## 2017-01-14 DIAGNOSIS — R739 Hyperglycemia, unspecified: Secondary | ICD-10-CM | POA: Diagnosis not present

## 2017-01-14 DIAGNOSIS — E119 Type 2 diabetes mellitus without complications: Secondary | ICD-10-CM | POA: Diagnosis not present

## 2017-01-14 DIAGNOSIS — D509 Iron deficiency anemia, unspecified: Secondary | ICD-10-CM | POA: Diagnosis not present

## 2017-01-18 DIAGNOSIS — E119 Type 2 diabetes mellitus without complications: Secondary | ICD-10-CM | POA: Diagnosis not present

## 2017-01-18 DIAGNOSIS — E161 Other hypoglycemia: Secondary | ICD-10-CM | POA: Diagnosis not present

## 2017-01-18 DIAGNOSIS — N2581 Secondary hyperparathyroidism of renal origin: Secondary | ICD-10-CM | POA: Diagnosis not present

## 2017-01-18 DIAGNOSIS — Z7901 Long term (current) use of anticoagulants: Secondary | ICD-10-CM | POA: Diagnosis not present

## 2017-01-18 DIAGNOSIS — D509 Iron deficiency anemia, unspecified: Secondary | ICD-10-CM | POA: Diagnosis not present

## 2017-01-18 DIAGNOSIS — N186 End stage renal disease: Secondary | ICD-10-CM | POA: Diagnosis not present

## 2017-01-18 DIAGNOSIS — I4891 Unspecified atrial fibrillation: Secondary | ICD-10-CM | POA: Diagnosis not present

## 2017-01-18 DIAGNOSIS — R739 Hyperglycemia, unspecified: Secondary | ICD-10-CM | POA: Diagnosis not present

## 2017-01-19 DIAGNOSIS — N2581 Secondary hyperparathyroidism of renal origin: Secondary | ICD-10-CM | POA: Diagnosis not present

## 2017-01-19 DIAGNOSIS — D509 Iron deficiency anemia, unspecified: Secondary | ICD-10-CM | POA: Diagnosis not present

## 2017-01-19 DIAGNOSIS — E119 Type 2 diabetes mellitus without complications: Secondary | ICD-10-CM | POA: Diagnosis not present

## 2017-01-19 DIAGNOSIS — N186 End stage renal disease: Secondary | ICD-10-CM | POA: Diagnosis not present

## 2017-01-19 DIAGNOSIS — R739 Hyperglycemia, unspecified: Secondary | ICD-10-CM | POA: Diagnosis not present

## 2017-01-19 DIAGNOSIS — E161 Other hypoglycemia: Secondary | ICD-10-CM | POA: Diagnosis not present

## 2017-01-21 DIAGNOSIS — R739 Hyperglycemia, unspecified: Secondary | ICD-10-CM | POA: Diagnosis not present

## 2017-01-21 DIAGNOSIS — E119 Type 2 diabetes mellitus without complications: Secondary | ICD-10-CM | POA: Diagnosis not present

## 2017-01-21 DIAGNOSIS — D509 Iron deficiency anemia, unspecified: Secondary | ICD-10-CM | POA: Diagnosis not present

## 2017-01-21 DIAGNOSIS — N2581 Secondary hyperparathyroidism of renal origin: Secondary | ICD-10-CM | POA: Diagnosis not present

## 2017-01-21 DIAGNOSIS — N186 End stage renal disease: Secondary | ICD-10-CM | POA: Diagnosis not present

## 2017-01-21 DIAGNOSIS — E161 Other hypoglycemia: Secondary | ICD-10-CM | POA: Diagnosis not present

## 2017-01-24 DIAGNOSIS — E119 Type 2 diabetes mellitus without complications: Secondary | ICD-10-CM | POA: Diagnosis not present

## 2017-01-24 DIAGNOSIS — N2581 Secondary hyperparathyroidism of renal origin: Secondary | ICD-10-CM | POA: Diagnosis not present

## 2017-01-24 DIAGNOSIS — D509 Iron deficiency anemia, unspecified: Secondary | ICD-10-CM | POA: Diagnosis not present

## 2017-01-24 DIAGNOSIS — I4891 Unspecified atrial fibrillation: Secondary | ICD-10-CM | POA: Diagnosis not present

## 2017-01-24 DIAGNOSIS — N186 End stage renal disease: Secondary | ICD-10-CM | POA: Diagnosis not present

## 2017-01-24 DIAGNOSIS — Z7901 Long term (current) use of anticoagulants: Secondary | ICD-10-CM | POA: Diagnosis not present

## 2017-01-24 DIAGNOSIS — R739 Hyperglycemia, unspecified: Secondary | ICD-10-CM | POA: Diagnosis not present

## 2017-01-24 DIAGNOSIS — E161 Other hypoglycemia: Secondary | ICD-10-CM | POA: Diagnosis not present

## 2017-01-25 DIAGNOSIS — N186 End stage renal disease: Secondary | ICD-10-CM | POA: Diagnosis not present

## 2017-01-25 DIAGNOSIS — Z992 Dependence on renal dialysis: Secondary | ICD-10-CM | POA: Diagnosis not present

## 2017-01-25 DIAGNOSIS — T82858A Stenosis of vascular prosthetic devices, implants and grafts, initial encounter: Secondary | ICD-10-CM | POA: Diagnosis not present

## 2017-01-25 DIAGNOSIS — I871 Compression of vein: Secondary | ICD-10-CM | POA: Diagnosis not present

## 2017-01-26 DIAGNOSIS — E161 Other hypoglycemia: Secondary | ICD-10-CM | POA: Diagnosis not present

## 2017-01-26 DIAGNOSIS — D509 Iron deficiency anemia, unspecified: Secondary | ICD-10-CM | POA: Diagnosis not present

## 2017-01-26 DIAGNOSIS — R739 Hyperglycemia, unspecified: Secondary | ICD-10-CM | POA: Diagnosis not present

## 2017-01-26 DIAGNOSIS — N186 End stage renal disease: Secondary | ICD-10-CM | POA: Diagnosis not present

## 2017-01-26 DIAGNOSIS — N2581 Secondary hyperparathyroidism of renal origin: Secondary | ICD-10-CM | POA: Diagnosis not present

## 2017-01-26 DIAGNOSIS — E119 Type 2 diabetes mellitus without complications: Secondary | ICD-10-CM | POA: Diagnosis not present

## 2017-01-28 DIAGNOSIS — R739 Hyperglycemia, unspecified: Secondary | ICD-10-CM | POA: Diagnosis not present

## 2017-01-28 DIAGNOSIS — N186 End stage renal disease: Secondary | ICD-10-CM | POA: Diagnosis not present

## 2017-01-28 DIAGNOSIS — N2581 Secondary hyperparathyroidism of renal origin: Secondary | ICD-10-CM | POA: Diagnosis not present

## 2017-01-28 DIAGNOSIS — E119 Type 2 diabetes mellitus without complications: Secondary | ICD-10-CM | POA: Diagnosis not present

## 2017-01-28 DIAGNOSIS — E161 Other hypoglycemia: Secondary | ICD-10-CM | POA: Diagnosis not present

## 2017-01-28 DIAGNOSIS — D509 Iron deficiency anemia, unspecified: Secondary | ICD-10-CM | POA: Diagnosis not present

## 2017-01-31 DIAGNOSIS — N2581 Secondary hyperparathyroidism of renal origin: Secondary | ICD-10-CM | POA: Diagnosis not present

## 2017-01-31 DIAGNOSIS — D509 Iron deficiency anemia, unspecified: Secondary | ICD-10-CM | POA: Diagnosis not present

## 2017-01-31 DIAGNOSIS — Z7901 Long term (current) use of anticoagulants: Secondary | ICD-10-CM | POA: Diagnosis not present

## 2017-01-31 DIAGNOSIS — R739 Hyperglycemia, unspecified: Secondary | ICD-10-CM | POA: Diagnosis not present

## 2017-01-31 DIAGNOSIS — N186 End stage renal disease: Secondary | ICD-10-CM | POA: Diagnosis not present

## 2017-01-31 DIAGNOSIS — E161 Other hypoglycemia: Secondary | ICD-10-CM | POA: Diagnosis not present

## 2017-01-31 DIAGNOSIS — E119 Type 2 diabetes mellitus without complications: Secondary | ICD-10-CM | POA: Diagnosis not present

## 2017-01-31 DIAGNOSIS — I4891 Unspecified atrial fibrillation: Secondary | ICD-10-CM | POA: Diagnosis not present

## 2017-02-02 DIAGNOSIS — E161 Other hypoglycemia: Secondary | ICD-10-CM | POA: Diagnosis not present

## 2017-02-02 DIAGNOSIS — R739 Hyperglycemia, unspecified: Secondary | ICD-10-CM | POA: Diagnosis not present

## 2017-02-02 DIAGNOSIS — N186 End stage renal disease: Secondary | ICD-10-CM | POA: Diagnosis not present

## 2017-02-02 DIAGNOSIS — N2581 Secondary hyperparathyroidism of renal origin: Secondary | ICD-10-CM | POA: Diagnosis not present

## 2017-02-02 DIAGNOSIS — D509 Iron deficiency anemia, unspecified: Secondary | ICD-10-CM | POA: Diagnosis not present

## 2017-02-02 DIAGNOSIS — E119 Type 2 diabetes mellitus without complications: Secondary | ICD-10-CM | POA: Diagnosis not present

## 2017-02-04 DIAGNOSIS — N2581 Secondary hyperparathyroidism of renal origin: Secondary | ICD-10-CM | POA: Diagnosis not present

## 2017-02-04 DIAGNOSIS — N186 End stage renal disease: Secondary | ICD-10-CM | POA: Diagnosis not present

## 2017-02-04 DIAGNOSIS — D509 Iron deficiency anemia, unspecified: Secondary | ICD-10-CM | POA: Diagnosis not present

## 2017-02-04 DIAGNOSIS — E161 Other hypoglycemia: Secondary | ICD-10-CM | POA: Diagnosis not present

## 2017-02-04 DIAGNOSIS — R739 Hyperglycemia, unspecified: Secondary | ICD-10-CM | POA: Diagnosis not present

## 2017-02-04 DIAGNOSIS — E119 Type 2 diabetes mellitus without complications: Secondary | ICD-10-CM | POA: Diagnosis not present

## 2017-02-07 DIAGNOSIS — N186 End stage renal disease: Secondary | ICD-10-CM | POA: Diagnosis not present

## 2017-02-07 DIAGNOSIS — R739 Hyperglycemia, unspecified: Secondary | ICD-10-CM | POA: Diagnosis not present

## 2017-02-07 DIAGNOSIS — Z7901 Long term (current) use of anticoagulants: Secondary | ICD-10-CM | POA: Diagnosis not present

## 2017-02-07 DIAGNOSIS — N2581 Secondary hyperparathyroidism of renal origin: Secondary | ICD-10-CM | POA: Diagnosis not present

## 2017-02-07 DIAGNOSIS — E119 Type 2 diabetes mellitus without complications: Secondary | ICD-10-CM | POA: Diagnosis not present

## 2017-02-07 DIAGNOSIS — I4891 Unspecified atrial fibrillation: Secondary | ICD-10-CM | POA: Diagnosis not present

## 2017-02-07 DIAGNOSIS — E161 Other hypoglycemia: Secondary | ICD-10-CM | POA: Diagnosis not present

## 2017-02-07 DIAGNOSIS — D509 Iron deficiency anemia, unspecified: Secondary | ICD-10-CM | POA: Diagnosis not present

## 2017-02-09 DIAGNOSIS — D509 Iron deficiency anemia, unspecified: Secondary | ICD-10-CM | POA: Diagnosis not present

## 2017-02-09 DIAGNOSIS — N186 End stage renal disease: Secondary | ICD-10-CM | POA: Diagnosis not present

## 2017-02-09 DIAGNOSIS — E119 Type 2 diabetes mellitus without complications: Secondary | ICD-10-CM | POA: Diagnosis not present

## 2017-02-09 DIAGNOSIS — R739 Hyperglycemia, unspecified: Secondary | ICD-10-CM | POA: Diagnosis not present

## 2017-02-09 DIAGNOSIS — N2581 Secondary hyperparathyroidism of renal origin: Secondary | ICD-10-CM | POA: Diagnosis not present

## 2017-02-09 DIAGNOSIS — E161 Other hypoglycemia: Secondary | ICD-10-CM | POA: Diagnosis not present

## 2017-02-10 DIAGNOSIS — N186 End stage renal disease: Secondary | ICD-10-CM | POA: Diagnosis not present

## 2017-02-10 DIAGNOSIS — Z992 Dependence on renal dialysis: Secondary | ICD-10-CM | POA: Diagnosis not present

## 2017-02-10 DIAGNOSIS — E1122 Type 2 diabetes mellitus with diabetic chronic kidney disease: Secondary | ICD-10-CM | POA: Diagnosis not present

## 2017-02-11 DIAGNOSIS — N2581 Secondary hyperparathyroidism of renal origin: Secondary | ICD-10-CM | POA: Diagnosis not present

## 2017-02-11 DIAGNOSIS — E119 Type 2 diabetes mellitus without complications: Secondary | ICD-10-CM | POA: Diagnosis not present

## 2017-02-11 DIAGNOSIS — D509 Iron deficiency anemia, unspecified: Secondary | ICD-10-CM | POA: Diagnosis not present

## 2017-02-11 DIAGNOSIS — N186 End stage renal disease: Secondary | ICD-10-CM | POA: Diagnosis not present

## 2017-02-11 DIAGNOSIS — Z992 Dependence on renal dialysis: Secondary | ICD-10-CM | POA: Diagnosis not present

## 2017-02-11 DIAGNOSIS — R739 Hyperglycemia, unspecified: Secondary | ICD-10-CM | POA: Diagnosis not present

## 2017-02-11 DIAGNOSIS — E1122 Type 2 diabetes mellitus with diabetic chronic kidney disease: Secondary | ICD-10-CM | POA: Diagnosis not present

## 2017-02-11 DIAGNOSIS — D631 Anemia in chronic kidney disease: Secondary | ICD-10-CM | POA: Diagnosis not present

## 2017-02-14 DIAGNOSIS — E119 Type 2 diabetes mellitus without complications: Secondary | ICD-10-CM | POA: Diagnosis not present

## 2017-02-14 DIAGNOSIS — D509 Iron deficiency anemia, unspecified: Secondary | ICD-10-CM | POA: Diagnosis not present

## 2017-02-14 DIAGNOSIS — R739 Hyperglycemia, unspecified: Secondary | ICD-10-CM | POA: Diagnosis not present

## 2017-02-14 DIAGNOSIS — I4891 Unspecified atrial fibrillation: Secondary | ICD-10-CM | POA: Diagnosis not present

## 2017-02-14 DIAGNOSIS — Z7901 Long term (current) use of anticoagulants: Secondary | ICD-10-CM | POA: Diagnosis not present

## 2017-02-14 DIAGNOSIS — N2581 Secondary hyperparathyroidism of renal origin: Secondary | ICD-10-CM | POA: Diagnosis not present

## 2017-02-14 DIAGNOSIS — D631 Anemia in chronic kidney disease: Secondary | ICD-10-CM | POA: Diagnosis not present

## 2017-02-14 DIAGNOSIS — N186 End stage renal disease: Secondary | ICD-10-CM | POA: Diagnosis not present

## 2017-02-16 DIAGNOSIS — D509 Iron deficiency anemia, unspecified: Secondary | ICD-10-CM | POA: Diagnosis not present

## 2017-02-16 DIAGNOSIS — R739 Hyperglycemia, unspecified: Secondary | ICD-10-CM | POA: Diagnosis not present

## 2017-02-16 DIAGNOSIS — N186 End stage renal disease: Secondary | ICD-10-CM | POA: Diagnosis not present

## 2017-02-16 DIAGNOSIS — N2581 Secondary hyperparathyroidism of renal origin: Secondary | ICD-10-CM | POA: Diagnosis not present

## 2017-02-16 DIAGNOSIS — E119 Type 2 diabetes mellitus without complications: Secondary | ICD-10-CM | POA: Diagnosis not present

## 2017-02-16 DIAGNOSIS — D631 Anemia in chronic kidney disease: Secondary | ICD-10-CM | POA: Diagnosis not present

## 2017-02-18 DIAGNOSIS — N2581 Secondary hyperparathyroidism of renal origin: Secondary | ICD-10-CM | POA: Diagnosis not present

## 2017-02-18 DIAGNOSIS — D509 Iron deficiency anemia, unspecified: Secondary | ICD-10-CM | POA: Diagnosis not present

## 2017-02-18 DIAGNOSIS — R739 Hyperglycemia, unspecified: Secondary | ICD-10-CM | POA: Diagnosis not present

## 2017-02-18 DIAGNOSIS — D631 Anemia in chronic kidney disease: Secondary | ICD-10-CM | POA: Diagnosis not present

## 2017-02-18 DIAGNOSIS — E119 Type 2 diabetes mellitus without complications: Secondary | ICD-10-CM | POA: Diagnosis not present

## 2017-02-18 DIAGNOSIS — N186 End stage renal disease: Secondary | ICD-10-CM | POA: Diagnosis not present

## 2017-02-21 DIAGNOSIS — N186 End stage renal disease: Secondary | ICD-10-CM | POA: Diagnosis not present

## 2017-02-21 DIAGNOSIS — E119 Type 2 diabetes mellitus without complications: Secondary | ICD-10-CM | POA: Diagnosis not present

## 2017-02-21 DIAGNOSIS — Z7901 Long term (current) use of anticoagulants: Secondary | ICD-10-CM | POA: Diagnosis not present

## 2017-02-21 DIAGNOSIS — N2581 Secondary hyperparathyroidism of renal origin: Secondary | ICD-10-CM | POA: Diagnosis not present

## 2017-02-21 DIAGNOSIS — D509 Iron deficiency anemia, unspecified: Secondary | ICD-10-CM | POA: Diagnosis not present

## 2017-02-21 DIAGNOSIS — I4891 Unspecified atrial fibrillation: Secondary | ICD-10-CM | POA: Diagnosis not present

## 2017-02-21 DIAGNOSIS — R739 Hyperglycemia, unspecified: Secondary | ICD-10-CM | POA: Diagnosis not present

## 2017-02-21 DIAGNOSIS — D631 Anemia in chronic kidney disease: Secondary | ICD-10-CM | POA: Diagnosis not present

## 2017-02-25 DIAGNOSIS — R739 Hyperglycemia, unspecified: Secondary | ICD-10-CM | POA: Diagnosis not present

## 2017-02-25 DIAGNOSIS — N2581 Secondary hyperparathyroidism of renal origin: Secondary | ICD-10-CM | POA: Diagnosis not present

## 2017-02-25 DIAGNOSIS — D509 Iron deficiency anemia, unspecified: Secondary | ICD-10-CM | POA: Diagnosis not present

## 2017-02-25 DIAGNOSIS — D631 Anemia in chronic kidney disease: Secondary | ICD-10-CM | POA: Diagnosis not present

## 2017-02-25 DIAGNOSIS — E119 Type 2 diabetes mellitus without complications: Secondary | ICD-10-CM | POA: Diagnosis not present

## 2017-02-25 DIAGNOSIS — N186 End stage renal disease: Secondary | ICD-10-CM | POA: Diagnosis not present

## 2017-02-28 DIAGNOSIS — R739 Hyperglycemia, unspecified: Secondary | ICD-10-CM | POA: Diagnosis not present

## 2017-02-28 DIAGNOSIS — N186 End stage renal disease: Secondary | ICD-10-CM | POA: Diagnosis not present

## 2017-02-28 DIAGNOSIS — Z7901 Long term (current) use of anticoagulants: Secondary | ICD-10-CM | POA: Diagnosis not present

## 2017-02-28 DIAGNOSIS — I4891 Unspecified atrial fibrillation: Secondary | ICD-10-CM | POA: Diagnosis not present

## 2017-02-28 DIAGNOSIS — N2581 Secondary hyperparathyroidism of renal origin: Secondary | ICD-10-CM | POA: Diagnosis not present

## 2017-02-28 DIAGNOSIS — D509 Iron deficiency anemia, unspecified: Secondary | ICD-10-CM | POA: Diagnosis not present

## 2017-02-28 DIAGNOSIS — E119 Type 2 diabetes mellitus without complications: Secondary | ICD-10-CM | POA: Diagnosis not present

## 2017-02-28 DIAGNOSIS — D631 Anemia in chronic kidney disease: Secondary | ICD-10-CM | POA: Diagnosis not present

## 2017-03-02 DIAGNOSIS — D509 Iron deficiency anemia, unspecified: Secondary | ICD-10-CM | POA: Diagnosis not present

## 2017-03-02 DIAGNOSIS — E119 Type 2 diabetes mellitus without complications: Secondary | ICD-10-CM | POA: Diagnosis not present

## 2017-03-02 DIAGNOSIS — N186 End stage renal disease: Secondary | ICD-10-CM | POA: Diagnosis not present

## 2017-03-02 DIAGNOSIS — N2581 Secondary hyperparathyroidism of renal origin: Secondary | ICD-10-CM | POA: Diagnosis not present

## 2017-03-02 DIAGNOSIS — R739 Hyperglycemia, unspecified: Secondary | ICD-10-CM | POA: Diagnosis not present

## 2017-03-02 DIAGNOSIS — D631 Anemia in chronic kidney disease: Secondary | ICD-10-CM | POA: Diagnosis not present

## 2017-03-04 DIAGNOSIS — N2581 Secondary hyperparathyroidism of renal origin: Secondary | ICD-10-CM | POA: Diagnosis not present

## 2017-03-04 DIAGNOSIS — D631 Anemia in chronic kidney disease: Secondary | ICD-10-CM | POA: Diagnosis not present

## 2017-03-04 DIAGNOSIS — D509 Iron deficiency anemia, unspecified: Secondary | ICD-10-CM | POA: Diagnosis not present

## 2017-03-04 DIAGNOSIS — E119 Type 2 diabetes mellitus without complications: Secondary | ICD-10-CM | POA: Diagnosis not present

## 2017-03-04 DIAGNOSIS — R739 Hyperglycemia, unspecified: Secondary | ICD-10-CM | POA: Diagnosis not present

## 2017-03-04 DIAGNOSIS — N186 End stage renal disease: Secondary | ICD-10-CM | POA: Diagnosis not present

## 2017-03-07 DIAGNOSIS — D509 Iron deficiency anemia, unspecified: Secondary | ICD-10-CM | POA: Diagnosis not present

## 2017-03-07 DIAGNOSIS — Z7901 Long term (current) use of anticoagulants: Secondary | ICD-10-CM | POA: Diagnosis not present

## 2017-03-07 DIAGNOSIS — N186 End stage renal disease: Secondary | ICD-10-CM | POA: Diagnosis not present

## 2017-03-07 DIAGNOSIS — I4891 Unspecified atrial fibrillation: Secondary | ICD-10-CM | POA: Diagnosis not present

## 2017-03-07 DIAGNOSIS — D631 Anemia in chronic kidney disease: Secondary | ICD-10-CM | POA: Diagnosis not present

## 2017-03-07 DIAGNOSIS — R739 Hyperglycemia, unspecified: Secondary | ICD-10-CM | POA: Diagnosis not present

## 2017-03-07 DIAGNOSIS — E119 Type 2 diabetes mellitus without complications: Secondary | ICD-10-CM | POA: Diagnosis not present

## 2017-03-07 DIAGNOSIS — N2581 Secondary hyperparathyroidism of renal origin: Secondary | ICD-10-CM | POA: Diagnosis not present

## 2017-03-09 DIAGNOSIS — D631 Anemia in chronic kidney disease: Secondary | ICD-10-CM | POA: Diagnosis not present

## 2017-03-09 DIAGNOSIS — N186 End stage renal disease: Secondary | ICD-10-CM | POA: Diagnosis not present

## 2017-03-09 DIAGNOSIS — R739 Hyperglycemia, unspecified: Secondary | ICD-10-CM | POA: Diagnosis not present

## 2017-03-09 DIAGNOSIS — N2581 Secondary hyperparathyroidism of renal origin: Secondary | ICD-10-CM | POA: Diagnosis not present

## 2017-03-09 DIAGNOSIS — D509 Iron deficiency anemia, unspecified: Secondary | ICD-10-CM | POA: Diagnosis not present

## 2017-03-09 DIAGNOSIS — E119 Type 2 diabetes mellitus without complications: Secondary | ICD-10-CM | POA: Diagnosis not present

## 2017-03-11 DIAGNOSIS — Z992 Dependence on renal dialysis: Secondary | ICD-10-CM | POA: Diagnosis not present

## 2017-03-11 DIAGNOSIS — N186 End stage renal disease: Secondary | ICD-10-CM | POA: Diagnosis not present

## 2017-03-11 DIAGNOSIS — E1122 Type 2 diabetes mellitus with diabetic chronic kidney disease: Secondary | ICD-10-CM | POA: Diagnosis not present

## 2017-03-14 DIAGNOSIS — Z7901 Long term (current) use of anticoagulants: Secondary | ICD-10-CM | POA: Diagnosis not present

## 2017-03-14 DIAGNOSIS — D509 Iron deficiency anemia, unspecified: Secondary | ICD-10-CM | POA: Diagnosis not present

## 2017-03-14 DIAGNOSIS — N186 End stage renal disease: Secondary | ICD-10-CM | POA: Diagnosis not present

## 2017-03-14 DIAGNOSIS — N2581 Secondary hyperparathyroidism of renal origin: Secondary | ICD-10-CM | POA: Diagnosis not present

## 2017-03-14 DIAGNOSIS — I4891 Unspecified atrial fibrillation: Secondary | ICD-10-CM | POA: Diagnosis not present

## 2017-03-14 DIAGNOSIS — D631 Anemia in chronic kidney disease: Secondary | ICD-10-CM | POA: Diagnosis not present

## 2017-03-14 DIAGNOSIS — E119 Type 2 diabetes mellitus without complications: Secondary | ICD-10-CM | POA: Diagnosis not present

## 2017-03-16 DIAGNOSIS — N2581 Secondary hyperparathyroidism of renal origin: Secondary | ICD-10-CM | POA: Diagnosis not present

## 2017-03-16 DIAGNOSIS — D631 Anemia in chronic kidney disease: Secondary | ICD-10-CM | POA: Diagnosis not present

## 2017-03-16 DIAGNOSIS — D509 Iron deficiency anemia, unspecified: Secondary | ICD-10-CM | POA: Diagnosis not present

## 2017-03-16 DIAGNOSIS — N186 End stage renal disease: Secondary | ICD-10-CM | POA: Diagnosis not present

## 2017-03-16 DIAGNOSIS — E119 Type 2 diabetes mellitus without complications: Secondary | ICD-10-CM | POA: Diagnosis not present

## 2017-03-18 DIAGNOSIS — N2581 Secondary hyperparathyroidism of renal origin: Secondary | ICD-10-CM | POA: Diagnosis not present

## 2017-03-18 DIAGNOSIS — D509 Iron deficiency anemia, unspecified: Secondary | ICD-10-CM | POA: Diagnosis not present

## 2017-03-18 DIAGNOSIS — N186 End stage renal disease: Secondary | ICD-10-CM | POA: Diagnosis not present

## 2017-03-18 DIAGNOSIS — E119 Type 2 diabetes mellitus without complications: Secondary | ICD-10-CM | POA: Diagnosis not present

## 2017-03-18 DIAGNOSIS — D631 Anemia in chronic kidney disease: Secondary | ICD-10-CM | POA: Diagnosis not present

## 2017-03-21 DIAGNOSIS — D631 Anemia in chronic kidney disease: Secondary | ICD-10-CM | POA: Diagnosis not present

## 2017-03-21 DIAGNOSIS — I4891 Unspecified atrial fibrillation: Secondary | ICD-10-CM | POA: Diagnosis not present

## 2017-03-21 DIAGNOSIS — E119 Type 2 diabetes mellitus without complications: Secondary | ICD-10-CM | POA: Diagnosis not present

## 2017-03-21 DIAGNOSIS — Z7901 Long term (current) use of anticoagulants: Secondary | ICD-10-CM | POA: Diagnosis not present

## 2017-03-21 DIAGNOSIS — N2581 Secondary hyperparathyroidism of renal origin: Secondary | ICD-10-CM | POA: Diagnosis not present

## 2017-03-21 DIAGNOSIS — N186 End stage renal disease: Secondary | ICD-10-CM | POA: Diagnosis not present

## 2017-03-21 DIAGNOSIS — D509 Iron deficiency anemia, unspecified: Secondary | ICD-10-CM | POA: Diagnosis not present

## 2017-03-23 DIAGNOSIS — N2581 Secondary hyperparathyroidism of renal origin: Secondary | ICD-10-CM | POA: Diagnosis not present

## 2017-03-23 DIAGNOSIS — N186 End stage renal disease: Secondary | ICD-10-CM | POA: Diagnosis not present

## 2017-03-23 DIAGNOSIS — D631 Anemia in chronic kidney disease: Secondary | ICD-10-CM | POA: Diagnosis not present

## 2017-03-23 DIAGNOSIS — D509 Iron deficiency anemia, unspecified: Secondary | ICD-10-CM | POA: Diagnosis not present

## 2017-03-23 DIAGNOSIS — E119 Type 2 diabetes mellitus without complications: Secondary | ICD-10-CM | POA: Diagnosis not present

## 2017-03-25 DIAGNOSIS — N2581 Secondary hyperparathyroidism of renal origin: Secondary | ICD-10-CM | POA: Diagnosis not present

## 2017-03-25 DIAGNOSIS — D631 Anemia in chronic kidney disease: Secondary | ICD-10-CM | POA: Diagnosis not present

## 2017-03-25 DIAGNOSIS — E119 Type 2 diabetes mellitus without complications: Secondary | ICD-10-CM | POA: Diagnosis not present

## 2017-03-25 DIAGNOSIS — N186 End stage renal disease: Secondary | ICD-10-CM | POA: Diagnosis not present

## 2017-03-25 DIAGNOSIS — D509 Iron deficiency anemia, unspecified: Secondary | ICD-10-CM | POA: Diagnosis not present

## 2017-03-28 DIAGNOSIS — D509 Iron deficiency anemia, unspecified: Secondary | ICD-10-CM | POA: Diagnosis not present

## 2017-03-28 DIAGNOSIS — E119 Type 2 diabetes mellitus without complications: Secondary | ICD-10-CM | POA: Diagnosis not present

## 2017-03-28 DIAGNOSIS — N186 End stage renal disease: Secondary | ICD-10-CM | POA: Diagnosis not present

## 2017-03-28 DIAGNOSIS — D631 Anemia in chronic kidney disease: Secondary | ICD-10-CM | POA: Diagnosis not present

## 2017-03-28 DIAGNOSIS — Z7901 Long term (current) use of anticoagulants: Secondary | ICD-10-CM | POA: Diagnosis not present

## 2017-03-28 DIAGNOSIS — I4891 Unspecified atrial fibrillation: Secondary | ICD-10-CM | POA: Diagnosis not present

## 2017-03-28 DIAGNOSIS — N2581 Secondary hyperparathyroidism of renal origin: Secondary | ICD-10-CM | POA: Diagnosis not present

## 2017-03-30 DIAGNOSIS — D509 Iron deficiency anemia, unspecified: Secondary | ICD-10-CM | POA: Diagnosis not present

## 2017-03-30 DIAGNOSIS — D631 Anemia in chronic kidney disease: Secondary | ICD-10-CM | POA: Diagnosis not present

## 2017-03-30 DIAGNOSIS — E119 Type 2 diabetes mellitus without complications: Secondary | ICD-10-CM | POA: Diagnosis not present

## 2017-03-30 DIAGNOSIS — N2581 Secondary hyperparathyroidism of renal origin: Secondary | ICD-10-CM | POA: Diagnosis not present

## 2017-03-30 DIAGNOSIS — N186 End stage renal disease: Secondary | ICD-10-CM | POA: Diagnosis not present

## 2017-04-01 DIAGNOSIS — D509 Iron deficiency anemia, unspecified: Secondary | ICD-10-CM | POA: Diagnosis not present

## 2017-04-01 DIAGNOSIS — N2581 Secondary hyperparathyroidism of renal origin: Secondary | ICD-10-CM | POA: Diagnosis not present

## 2017-04-01 DIAGNOSIS — D631 Anemia in chronic kidney disease: Secondary | ICD-10-CM | POA: Diagnosis not present

## 2017-04-01 DIAGNOSIS — E119 Type 2 diabetes mellitus without complications: Secondary | ICD-10-CM | POA: Diagnosis not present

## 2017-04-01 DIAGNOSIS — N186 End stage renal disease: Secondary | ICD-10-CM | POA: Diagnosis not present

## 2017-04-04 DIAGNOSIS — N186 End stage renal disease: Secondary | ICD-10-CM | POA: Diagnosis not present

## 2017-04-04 DIAGNOSIS — E119 Type 2 diabetes mellitus without complications: Secondary | ICD-10-CM | POA: Diagnosis not present

## 2017-04-04 DIAGNOSIS — D509 Iron deficiency anemia, unspecified: Secondary | ICD-10-CM | POA: Diagnosis not present

## 2017-04-04 DIAGNOSIS — I4891 Unspecified atrial fibrillation: Secondary | ICD-10-CM | POA: Diagnosis not present

## 2017-04-04 DIAGNOSIS — D631 Anemia in chronic kidney disease: Secondary | ICD-10-CM | POA: Diagnosis not present

## 2017-04-04 DIAGNOSIS — Z7901 Long term (current) use of anticoagulants: Secondary | ICD-10-CM | POA: Diagnosis not present

## 2017-04-04 DIAGNOSIS — N2581 Secondary hyperparathyroidism of renal origin: Secondary | ICD-10-CM | POA: Diagnosis not present

## 2017-04-06 DIAGNOSIS — N186 End stage renal disease: Secondary | ICD-10-CM | POA: Diagnosis not present

## 2017-04-06 DIAGNOSIS — E119 Type 2 diabetes mellitus without complications: Secondary | ICD-10-CM | POA: Diagnosis not present

## 2017-04-06 DIAGNOSIS — N2581 Secondary hyperparathyroidism of renal origin: Secondary | ICD-10-CM | POA: Diagnosis not present

## 2017-04-06 DIAGNOSIS — D509 Iron deficiency anemia, unspecified: Secondary | ICD-10-CM | POA: Diagnosis not present

## 2017-04-06 DIAGNOSIS — D631 Anemia in chronic kidney disease: Secondary | ICD-10-CM | POA: Diagnosis not present

## 2017-04-08 DIAGNOSIS — N186 End stage renal disease: Secondary | ICD-10-CM | POA: Diagnosis not present

## 2017-04-08 DIAGNOSIS — N2581 Secondary hyperparathyroidism of renal origin: Secondary | ICD-10-CM | POA: Diagnosis not present

## 2017-04-08 DIAGNOSIS — D509 Iron deficiency anemia, unspecified: Secondary | ICD-10-CM | POA: Diagnosis not present

## 2017-04-08 DIAGNOSIS — E119 Type 2 diabetes mellitus without complications: Secondary | ICD-10-CM | POA: Diagnosis not present

## 2017-04-08 DIAGNOSIS — D631 Anemia in chronic kidney disease: Secondary | ICD-10-CM | POA: Diagnosis not present

## 2017-04-11 DIAGNOSIS — E119 Type 2 diabetes mellitus without complications: Secondary | ICD-10-CM | POA: Diagnosis not present

## 2017-04-11 DIAGNOSIS — I4891 Unspecified atrial fibrillation: Secondary | ICD-10-CM | POA: Diagnosis not present

## 2017-04-11 DIAGNOSIS — E1122 Type 2 diabetes mellitus with diabetic chronic kidney disease: Secondary | ICD-10-CM | POA: Diagnosis not present

## 2017-04-11 DIAGNOSIS — D509 Iron deficiency anemia, unspecified: Secondary | ICD-10-CM | POA: Diagnosis not present

## 2017-04-11 DIAGNOSIS — N2581 Secondary hyperparathyroidism of renal origin: Secondary | ICD-10-CM | POA: Diagnosis not present

## 2017-04-11 DIAGNOSIS — Z7901 Long term (current) use of anticoagulants: Secondary | ICD-10-CM | POA: Diagnosis not present

## 2017-04-11 DIAGNOSIS — Z992 Dependence on renal dialysis: Secondary | ICD-10-CM | POA: Diagnosis not present

## 2017-04-11 DIAGNOSIS — N186 End stage renal disease: Secondary | ICD-10-CM | POA: Diagnosis not present

## 2017-04-13 DIAGNOSIS — N186 End stage renal disease: Secondary | ICD-10-CM | POA: Diagnosis not present

## 2017-04-13 DIAGNOSIS — N2581 Secondary hyperparathyroidism of renal origin: Secondary | ICD-10-CM | POA: Diagnosis not present

## 2017-04-13 DIAGNOSIS — D509 Iron deficiency anemia, unspecified: Secondary | ICD-10-CM | POA: Diagnosis not present

## 2017-04-13 DIAGNOSIS — E119 Type 2 diabetes mellitus without complications: Secondary | ICD-10-CM | POA: Diagnosis not present

## 2017-04-15 DIAGNOSIS — N186 End stage renal disease: Secondary | ICD-10-CM | POA: Diagnosis not present

## 2017-04-15 DIAGNOSIS — E119 Type 2 diabetes mellitus without complications: Secondary | ICD-10-CM | POA: Diagnosis not present

## 2017-04-15 DIAGNOSIS — D509 Iron deficiency anemia, unspecified: Secondary | ICD-10-CM | POA: Diagnosis not present

## 2017-04-15 DIAGNOSIS — N2581 Secondary hyperparathyroidism of renal origin: Secondary | ICD-10-CM | POA: Diagnosis not present

## 2017-04-20 DIAGNOSIS — Z7901 Long term (current) use of anticoagulants: Secondary | ICD-10-CM | POA: Diagnosis not present

## 2017-04-20 DIAGNOSIS — N2581 Secondary hyperparathyroidism of renal origin: Secondary | ICD-10-CM | POA: Diagnosis not present

## 2017-04-20 DIAGNOSIS — D509 Iron deficiency anemia, unspecified: Secondary | ICD-10-CM | POA: Diagnosis not present

## 2017-04-20 DIAGNOSIS — I4891 Unspecified atrial fibrillation: Secondary | ICD-10-CM | POA: Diagnosis not present

## 2017-04-20 DIAGNOSIS — N186 End stage renal disease: Secondary | ICD-10-CM | POA: Diagnosis not present

## 2017-04-20 DIAGNOSIS — E119 Type 2 diabetes mellitus without complications: Secondary | ICD-10-CM | POA: Diagnosis not present

## 2017-04-22 DIAGNOSIS — N186 End stage renal disease: Secondary | ICD-10-CM | POA: Diagnosis not present

## 2017-04-22 DIAGNOSIS — D509 Iron deficiency anemia, unspecified: Secondary | ICD-10-CM | POA: Diagnosis not present

## 2017-04-22 DIAGNOSIS — N2581 Secondary hyperparathyroidism of renal origin: Secondary | ICD-10-CM | POA: Diagnosis not present

## 2017-04-22 DIAGNOSIS — E119 Type 2 diabetes mellitus without complications: Secondary | ICD-10-CM | POA: Diagnosis not present

## 2017-04-25 DIAGNOSIS — D509 Iron deficiency anemia, unspecified: Secondary | ICD-10-CM | POA: Diagnosis not present

## 2017-04-25 DIAGNOSIS — N186 End stage renal disease: Secondary | ICD-10-CM | POA: Diagnosis not present

## 2017-04-25 DIAGNOSIS — Z7901 Long term (current) use of anticoagulants: Secondary | ICD-10-CM | POA: Diagnosis not present

## 2017-04-25 DIAGNOSIS — E119 Type 2 diabetes mellitus without complications: Secondary | ICD-10-CM | POA: Diagnosis not present

## 2017-04-25 DIAGNOSIS — N2581 Secondary hyperparathyroidism of renal origin: Secondary | ICD-10-CM | POA: Diagnosis not present

## 2017-04-25 DIAGNOSIS — I4891 Unspecified atrial fibrillation: Secondary | ICD-10-CM | POA: Diagnosis not present

## 2017-04-27 DIAGNOSIS — E119 Type 2 diabetes mellitus without complications: Secondary | ICD-10-CM | POA: Diagnosis not present

## 2017-04-27 DIAGNOSIS — N186 End stage renal disease: Secondary | ICD-10-CM | POA: Diagnosis not present

## 2017-04-27 DIAGNOSIS — N2581 Secondary hyperparathyroidism of renal origin: Secondary | ICD-10-CM | POA: Diagnosis not present

## 2017-04-27 DIAGNOSIS — D509 Iron deficiency anemia, unspecified: Secondary | ICD-10-CM | POA: Diagnosis not present

## 2017-04-29 DIAGNOSIS — N2581 Secondary hyperparathyroidism of renal origin: Secondary | ICD-10-CM | POA: Diagnosis not present

## 2017-04-29 DIAGNOSIS — N186 End stage renal disease: Secondary | ICD-10-CM | POA: Diagnosis not present

## 2017-04-29 DIAGNOSIS — D509 Iron deficiency anemia, unspecified: Secondary | ICD-10-CM | POA: Diagnosis not present

## 2017-04-29 DIAGNOSIS — E119 Type 2 diabetes mellitus without complications: Secondary | ICD-10-CM | POA: Diagnosis not present

## 2017-05-02 DIAGNOSIS — Z7901 Long term (current) use of anticoagulants: Secondary | ICD-10-CM | POA: Diagnosis not present

## 2017-05-02 DIAGNOSIS — I4891 Unspecified atrial fibrillation: Secondary | ICD-10-CM | POA: Diagnosis not present

## 2017-05-02 DIAGNOSIS — E119 Type 2 diabetes mellitus without complications: Secondary | ICD-10-CM | POA: Diagnosis not present

## 2017-05-02 DIAGNOSIS — D509 Iron deficiency anemia, unspecified: Secondary | ICD-10-CM | POA: Diagnosis not present

## 2017-05-02 DIAGNOSIS — N2581 Secondary hyperparathyroidism of renal origin: Secondary | ICD-10-CM | POA: Diagnosis not present

## 2017-05-02 DIAGNOSIS — N186 End stage renal disease: Secondary | ICD-10-CM | POA: Diagnosis not present

## 2017-05-06 DIAGNOSIS — N2581 Secondary hyperparathyroidism of renal origin: Secondary | ICD-10-CM | POA: Diagnosis not present

## 2017-05-06 DIAGNOSIS — D509 Iron deficiency anemia, unspecified: Secondary | ICD-10-CM | POA: Diagnosis not present

## 2017-05-06 DIAGNOSIS — N186 End stage renal disease: Secondary | ICD-10-CM | POA: Diagnosis not present

## 2017-05-06 DIAGNOSIS — E119 Type 2 diabetes mellitus without complications: Secondary | ICD-10-CM | POA: Diagnosis not present

## 2017-05-09 DIAGNOSIS — N2581 Secondary hyperparathyroidism of renal origin: Secondary | ICD-10-CM | POA: Diagnosis not present

## 2017-05-09 DIAGNOSIS — N186 End stage renal disease: Secondary | ICD-10-CM | POA: Diagnosis not present

## 2017-05-09 DIAGNOSIS — E119 Type 2 diabetes mellitus without complications: Secondary | ICD-10-CM | POA: Diagnosis not present

## 2017-05-09 DIAGNOSIS — I4891 Unspecified atrial fibrillation: Secondary | ICD-10-CM | POA: Diagnosis not present

## 2017-05-09 DIAGNOSIS — D509 Iron deficiency anemia, unspecified: Secondary | ICD-10-CM | POA: Diagnosis not present

## 2017-05-09 DIAGNOSIS — Z7901 Long term (current) use of anticoagulants: Secondary | ICD-10-CM | POA: Diagnosis not present

## 2017-05-11 DIAGNOSIS — N2581 Secondary hyperparathyroidism of renal origin: Secondary | ICD-10-CM | POA: Diagnosis not present

## 2017-05-11 DIAGNOSIS — N186 End stage renal disease: Secondary | ICD-10-CM | POA: Diagnosis not present

## 2017-05-11 DIAGNOSIS — Z992 Dependence on renal dialysis: Secondary | ICD-10-CM | POA: Diagnosis not present

## 2017-05-11 DIAGNOSIS — E876 Hypokalemia: Secondary | ICD-10-CM | POA: Diagnosis not present

## 2017-05-11 DIAGNOSIS — E119 Type 2 diabetes mellitus without complications: Secondary | ICD-10-CM | POA: Diagnosis not present

## 2017-05-11 DIAGNOSIS — D631 Anemia in chronic kidney disease: Secondary | ICD-10-CM | POA: Diagnosis not present

## 2017-05-11 DIAGNOSIS — E1122 Type 2 diabetes mellitus with diabetic chronic kidney disease: Secondary | ICD-10-CM | POA: Diagnosis not present

## 2017-05-13 DIAGNOSIS — E876 Hypokalemia: Secondary | ICD-10-CM | POA: Diagnosis not present

## 2017-05-13 DIAGNOSIS — N2581 Secondary hyperparathyroidism of renal origin: Secondary | ICD-10-CM | POA: Diagnosis not present

## 2017-05-13 DIAGNOSIS — D631 Anemia in chronic kidney disease: Secondary | ICD-10-CM | POA: Diagnosis not present

## 2017-05-13 DIAGNOSIS — N186 End stage renal disease: Secondary | ICD-10-CM | POA: Diagnosis not present

## 2017-05-13 DIAGNOSIS — E119 Type 2 diabetes mellitus without complications: Secondary | ICD-10-CM | POA: Diagnosis not present

## 2017-05-18 DIAGNOSIS — D631 Anemia in chronic kidney disease: Secondary | ICD-10-CM | POA: Diagnosis not present

## 2017-05-18 DIAGNOSIS — E876 Hypokalemia: Secondary | ICD-10-CM | POA: Diagnosis not present

## 2017-05-18 DIAGNOSIS — Z7901 Long term (current) use of anticoagulants: Secondary | ICD-10-CM | POA: Diagnosis not present

## 2017-05-18 DIAGNOSIS — I4891 Unspecified atrial fibrillation: Secondary | ICD-10-CM | POA: Diagnosis not present

## 2017-05-18 DIAGNOSIS — E119 Type 2 diabetes mellitus without complications: Secondary | ICD-10-CM | POA: Diagnosis not present

## 2017-05-18 DIAGNOSIS — N186 End stage renal disease: Secondary | ICD-10-CM | POA: Diagnosis not present

## 2017-05-18 DIAGNOSIS — N2581 Secondary hyperparathyroidism of renal origin: Secondary | ICD-10-CM | POA: Diagnosis not present

## 2017-05-20 DIAGNOSIS — N186 End stage renal disease: Secondary | ICD-10-CM | POA: Diagnosis not present

## 2017-05-20 DIAGNOSIS — E119 Type 2 diabetes mellitus without complications: Secondary | ICD-10-CM | POA: Diagnosis not present

## 2017-05-20 DIAGNOSIS — D631 Anemia in chronic kidney disease: Secondary | ICD-10-CM | POA: Diagnosis not present

## 2017-05-20 DIAGNOSIS — E876 Hypokalemia: Secondary | ICD-10-CM | POA: Diagnosis not present

## 2017-05-20 DIAGNOSIS — N2581 Secondary hyperparathyroidism of renal origin: Secondary | ICD-10-CM | POA: Diagnosis not present

## 2017-05-23 DIAGNOSIS — E876 Hypokalemia: Secondary | ICD-10-CM | POA: Diagnosis not present

## 2017-05-23 DIAGNOSIS — E119 Type 2 diabetes mellitus without complications: Secondary | ICD-10-CM | POA: Diagnosis not present

## 2017-05-23 DIAGNOSIS — D631 Anemia in chronic kidney disease: Secondary | ICD-10-CM | POA: Diagnosis not present

## 2017-05-23 DIAGNOSIS — I4891 Unspecified atrial fibrillation: Secondary | ICD-10-CM | POA: Diagnosis not present

## 2017-05-23 DIAGNOSIS — N186 End stage renal disease: Secondary | ICD-10-CM | POA: Diagnosis not present

## 2017-05-23 DIAGNOSIS — Z7901 Long term (current) use of anticoagulants: Secondary | ICD-10-CM | POA: Diagnosis not present

## 2017-05-23 DIAGNOSIS — N2581 Secondary hyperparathyroidism of renal origin: Secondary | ICD-10-CM | POA: Diagnosis not present

## 2017-05-25 DIAGNOSIS — D631 Anemia in chronic kidney disease: Secondary | ICD-10-CM | POA: Diagnosis not present

## 2017-05-25 DIAGNOSIS — E119 Type 2 diabetes mellitus without complications: Secondary | ICD-10-CM | POA: Diagnosis not present

## 2017-05-25 DIAGNOSIS — E876 Hypokalemia: Secondary | ICD-10-CM | POA: Diagnosis not present

## 2017-05-25 DIAGNOSIS — N186 End stage renal disease: Secondary | ICD-10-CM | POA: Diagnosis not present

## 2017-05-25 DIAGNOSIS — N2581 Secondary hyperparathyroidism of renal origin: Secondary | ICD-10-CM | POA: Diagnosis not present

## 2017-05-27 DIAGNOSIS — D631 Anemia in chronic kidney disease: Secondary | ICD-10-CM | POA: Diagnosis not present

## 2017-05-27 DIAGNOSIS — N186 End stage renal disease: Secondary | ICD-10-CM | POA: Diagnosis not present

## 2017-05-27 DIAGNOSIS — N2581 Secondary hyperparathyroidism of renal origin: Secondary | ICD-10-CM | POA: Diagnosis not present

## 2017-05-27 DIAGNOSIS — E876 Hypokalemia: Secondary | ICD-10-CM | POA: Diagnosis not present

## 2017-05-27 DIAGNOSIS — E119 Type 2 diabetes mellitus without complications: Secondary | ICD-10-CM | POA: Diagnosis not present

## 2017-06-01 DIAGNOSIS — E119 Type 2 diabetes mellitus without complications: Secondary | ICD-10-CM | POA: Diagnosis not present

## 2017-06-01 DIAGNOSIS — E876 Hypokalemia: Secondary | ICD-10-CM | POA: Diagnosis not present

## 2017-06-01 DIAGNOSIS — N186 End stage renal disease: Secondary | ICD-10-CM | POA: Diagnosis not present

## 2017-06-01 DIAGNOSIS — N2581 Secondary hyperparathyroidism of renal origin: Secondary | ICD-10-CM | POA: Diagnosis not present

## 2017-06-01 DIAGNOSIS — D631 Anemia in chronic kidney disease: Secondary | ICD-10-CM | POA: Diagnosis not present

## 2017-06-03 DIAGNOSIS — E876 Hypokalemia: Secondary | ICD-10-CM | POA: Diagnosis not present

## 2017-06-03 DIAGNOSIS — N2581 Secondary hyperparathyroidism of renal origin: Secondary | ICD-10-CM | POA: Diagnosis not present

## 2017-06-03 DIAGNOSIS — E119 Type 2 diabetes mellitus without complications: Secondary | ICD-10-CM | POA: Diagnosis not present

## 2017-06-03 DIAGNOSIS — N186 End stage renal disease: Secondary | ICD-10-CM | POA: Diagnosis not present

## 2017-06-03 DIAGNOSIS — D631 Anemia in chronic kidney disease: Secondary | ICD-10-CM | POA: Diagnosis not present

## 2017-06-06 DIAGNOSIS — E876 Hypokalemia: Secondary | ICD-10-CM | POA: Diagnosis not present

## 2017-06-06 DIAGNOSIS — E119 Type 2 diabetes mellitus without complications: Secondary | ICD-10-CM | POA: Diagnosis not present

## 2017-06-06 DIAGNOSIS — N2581 Secondary hyperparathyroidism of renal origin: Secondary | ICD-10-CM | POA: Diagnosis not present

## 2017-06-06 DIAGNOSIS — I4891 Unspecified atrial fibrillation: Secondary | ICD-10-CM | POA: Diagnosis not present

## 2017-06-06 DIAGNOSIS — N186 End stage renal disease: Secondary | ICD-10-CM | POA: Diagnosis not present

## 2017-06-06 DIAGNOSIS — D631 Anemia in chronic kidney disease: Secondary | ICD-10-CM | POA: Diagnosis not present

## 2017-06-06 DIAGNOSIS — Z7901 Long term (current) use of anticoagulants: Secondary | ICD-10-CM | POA: Diagnosis not present

## 2017-06-10 DIAGNOSIS — E876 Hypokalemia: Secondary | ICD-10-CM | POA: Diagnosis not present

## 2017-06-10 DIAGNOSIS — D631 Anemia in chronic kidney disease: Secondary | ICD-10-CM | POA: Diagnosis not present

## 2017-06-10 DIAGNOSIS — N186 End stage renal disease: Secondary | ICD-10-CM | POA: Diagnosis not present

## 2017-06-10 DIAGNOSIS — N2581 Secondary hyperparathyroidism of renal origin: Secondary | ICD-10-CM | POA: Diagnosis not present

## 2017-06-10 DIAGNOSIS — E119 Type 2 diabetes mellitus without complications: Secondary | ICD-10-CM | POA: Diagnosis not present

## 2017-06-11 DIAGNOSIS — Z992 Dependence on renal dialysis: Secondary | ICD-10-CM | POA: Diagnosis not present

## 2017-06-11 DIAGNOSIS — N186 End stage renal disease: Secondary | ICD-10-CM | POA: Diagnosis not present

## 2017-06-11 DIAGNOSIS — E1122 Type 2 diabetes mellitus with diabetic chronic kidney disease: Secondary | ICD-10-CM | POA: Diagnosis not present

## 2017-06-13 DIAGNOSIS — I4891 Unspecified atrial fibrillation: Secondary | ICD-10-CM | POA: Diagnosis not present

## 2017-06-13 DIAGNOSIS — Z7901 Long term (current) use of anticoagulants: Secondary | ICD-10-CM | POA: Diagnosis not present

## 2017-06-13 DIAGNOSIS — N2581 Secondary hyperparathyroidism of renal origin: Secondary | ICD-10-CM | POA: Diagnosis not present

## 2017-06-13 DIAGNOSIS — N186 End stage renal disease: Secondary | ICD-10-CM | POA: Diagnosis not present

## 2017-06-13 DIAGNOSIS — D631 Anemia in chronic kidney disease: Secondary | ICD-10-CM | POA: Diagnosis not present

## 2017-06-13 DIAGNOSIS — E119 Type 2 diabetes mellitus without complications: Secondary | ICD-10-CM | POA: Diagnosis not present

## 2017-06-14 DIAGNOSIS — M79641 Pain in right hand: Secondary | ICD-10-CM | POA: Diagnosis not present

## 2017-06-14 DIAGNOSIS — I739 Peripheral vascular disease, unspecified: Secondary | ICD-10-CM | POA: Diagnosis not present

## 2017-06-15 ENCOUNTER — Other Ambulatory Visit: Payer: Self-pay

## 2017-06-15 DIAGNOSIS — D631 Anemia in chronic kidney disease: Secondary | ICD-10-CM | POA: Diagnosis not present

## 2017-06-15 DIAGNOSIS — E119 Type 2 diabetes mellitus without complications: Secondary | ICD-10-CM | POA: Diagnosis not present

## 2017-06-15 DIAGNOSIS — I6523 Occlusion and stenosis of bilateral carotid arteries: Secondary | ICD-10-CM

## 2017-06-15 DIAGNOSIS — N2581 Secondary hyperparathyroidism of renal origin: Secondary | ICD-10-CM | POA: Diagnosis not present

## 2017-06-15 DIAGNOSIS — N186 End stage renal disease: Secondary | ICD-10-CM | POA: Diagnosis not present

## 2017-06-16 ENCOUNTER — Other Ambulatory Visit: Payer: Self-pay

## 2017-06-16 DIAGNOSIS — T82510D Breakdown (mechanical) of surgically created arteriovenous fistula, subsequent encounter: Secondary | ICD-10-CM

## 2017-06-16 DIAGNOSIS — M79601 Pain in right arm: Secondary | ICD-10-CM

## 2017-06-17 DIAGNOSIS — D631 Anemia in chronic kidney disease: Secondary | ICD-10-CM | POA: Diagnosis not present

## 2017-06-17 DIAGNOSIS — N186 End stage renal disease: Secondary | ICD-10-CM | POA: Diagnosis not present

## 2017-06-17 DIAGNOSIS — E119 Type 2 diabetes mellitus without complications: Secondary | ICD-10-CM | POA: Diagnosis not present

## 2017-06-17 DIAGNOSIS — N2581 Secondary hyperparathyroidism of renal origin: Secondary | ICD-10-CM | POA: Diagnosis not present

## 2017-06-20 DIAGNOSIS — N186 End stage renal disease: Secondary | ICD-10-CM | POA: Diagnosis not present

## 2017-06-20 DIAGNOSIS — N2581 Secondary hyperparathyroidism of renal origin: Secondary | ICD-10-CM | POA: Diagnosis not present

## 2017-06-20 DIAGNOSIS — Z7901 Long term (current) use of anticoagulants: Secondary | ICD-10-CM | POA: Diagnosis not present

## 2017-06-20 DIAGNOSIS — I4891 Unspecified atrial fibrillation: Secondary | ICD-10-CM | POA: Diagnosis not present

## 2017-06-20 DIAGNOSIS — D631 Anemia in chronic kidney disease: Secondary | ICD-10-CM | POA: Diagnosis not present

## 2017-06-20 DIAGNOSIS — E119 Type 2 diabetes mellitus without complications: Secondary | ICD-10-CM | POA: Diagnosis not present

## 2017-06-24 DIAGNOSIS — N2581 Secondary hyperparathyroidism of renal origin: Secondary | ICD-10-CM | POA: Diagnosis not present

## 2017-06-24 DIAGNOSIS — N186 End stage renal disease: Secondary | ICD-10-CM | POA: Diagnosis not present

## 2017-06-24 DIAGNOSIS — E119 Type 2 diabetes mellitus without complications: Secondary | ICD-10-CM | POA: Diagnosis not present

## 2017-06-24 DIAGNOSIS — D631 Anemia in chronic kidney disease: Secondary | ICD-10-CM | POA: Diagnosis not present

## 2017-06-29 ENCOUNTER — Encounter (HOSPITAL_COMMUNITY): Payer: Self-pay

## 2017-06-29 ENCOUNTER — Emergency Department (HOSPITAL_COMMUNITY): Payer: Medicare Other

## 2017-06-29 ENCOUNTER — Inpatient Hospital Stay (HOSPITAL_COMMUNITY)
Admission: EM | Admit: 2017-06-29 | Discharge: 2017-07-01 | DRG: 444 | Disposition: A | Discharge status: Home / Self Care | Payer: Medicare Other | Attending: Internal Medicine | Admitting: Internal Medicine

## 2017-06-29 ENCOUNTER — Other Ambulatory Visit: Payer: Self-pay

## 2017-06-29 DIAGNOSIS — E1142 Type 2 diabetes mellitus with diabetic polyneuropathy: Secondary | ICD-10-CM | POA: Diagnosis present

## 2017-06-29 DIAGNOSIS — I480 Paroxysmal atrial fibrillation: Secondary | ICD-10-CM | POA: Diagnosis present

## 2017-06-29 DIAGNOSIS — E1129 Type 2 diabetes mellitus with other diabetic kidney complication: Secondary | ICD-10-CM | POA: Diagnosis present

## 2017-06-29 DIAGNOSIS — R1010 Upper abdominal pain, unspecified: Secondary | ICD-10-CM

## 2017-06-29 DIAGNOSIS — I252 Old myocardial infarction: Secondary | ICD-10-CM

## 2017-06-29 DIAGNOSIS — Z992 Dependence on renal dialysis: Secondary | ICD-10-CM

## 2017-06-29 DIAGNOSIS — R000 Tachycardia, unspecified: Secondary | ICD-10-CM | POA: Diagnosis not present

## 2017-06-29 DIAGNOSIS — R1011 Right upper quadrant pain: Secondary | ICD-10-CM | POA: Diagnosis not present

## 2017-06-29 DIAGNOSIS — E1122 Type 2 diabetes mellitus with diabetic chronic kidney disease: Secondary | ICD-10-CM | POA: Diagnosis present

## 2017-06-29 DIAGNOSIS — Z79899 Other long term (current) drug therapy: Secondary | ICD-10-CM

## 2017-06-29 DIAGNOSIS — Z89511 Acquired absence of right leg below knee: Secondary | ICD-10-CM

## 2017-06-29 DIAGNOSIS — Z7901 Long term (current) use of anticoagulants: Secondary | ICD-10-CM

## 2017-06-29 DIAGNOSIS — I255 Ischemic cardiomyopathy: Secondary | ICD-10-CM | POA: Diagnosis present

## 2017-06-29 DIAGNOSIS — R0902 Hypoxemia: Secondary | ICD-10-CM | POA: Diagnosis not present

## 2017-06-29 DIAGNOSIS — D631 Anemia in chronic kidney disease: Secondary | ICD-10-CM | POA: Diagnosis not present

## 2017-06-29 DIAGNOSIS — Z9289 Personal history of other medical treatment: Secondary | ICD-10-CM

## 2017-06-29 DIAGNOSIS — Z86718 Personal history of other venous thrombosis and embolism: Secondary | ICD-10-CM

## 2017-06-29 DIAGNOSIS — I10 Essential (primary) hypertension: Secondary | ICD-10-CM | POA: Diagnosis present

## 2017-06-29 DIAGNOSIS — I4891 Unspecified atrial fibrillation: Secondary | ICD-10-CM | POA: Diagnosis not present

## 2017-06-29 DIAGNOSIS — K8020 Calculus of gallbladder without cholecystitis without obstruction: Secondary | ICD-10-CM

## 2017-06-29 DIAGNOSIS — Z7982 Long term (current) use of aspirin: Secondary | ICD-10-CM

## 2017-06-29 DIAGNOSIS — Z951 Presence of aortocoronary bypass graft: Secondary | ICD-10-CM

## 2017-06-29 DIAGNOSIS — N189 Chronic kidney disease, unspecified: Secondary | ICD-10-CM

## 2017-06-29 DIAGNOSIS — Z89512 Acquired absence of left leg below knee: Secondary | ICD-10-CM

## 2017-06-29 DIAGNOSIS — E119 Type 2 diabetes mellitus without complications: Secondary | ICD-10-CM | POA: Diagnosis not present

## 2017-06-29 DIAGNOSIS — Z794 Long term (current) use of insulin: Secondary | ICD-10-CM

## 2017-06-29 DIAGNOSIS — E785 Hyperlipidemia, unspecified: Secondary | ICD-10-CM | POA: Diagnosis present

## 2017-06-29 DIAGNOSIS — R1013 Epigastric pain: Secondary | ICD-10-CM | POA: Diagnosis not present

## 2017-06-29 DIAGNOSIS — I132 Hypertensive heart and chronic kidney disease with heart failure and with stage 5 chronic kidney disease, or end stage renal disease: Secondary | ICD-10-CM | POA: Diagnosis present

## 2017-06-29 DIAGNOSIS — R1111 Vomiting without nausea: Secondary | ICD-10-CM | POA: Diagnosis not present

## 2017-06-29 DIAGNOSIS — I2510 Atherosclerotic heart disease of native coronary artery without angina pectoris: Secondary | ICD-10-CM | POA: Diagnosis present

## 2017-06-29 DIAGNOSIS — N2581 Secondary hyperparathyroidism of renal origin: Secondary | ICD-10-CM | POA: Diagnosis not present

## 2017-06-29 DIAGNOSIS — N186 End stage renal disease: Secondary | ICD-10-CM | POA: Diagnosis not present

## 2017-06-29 DIAGNOSIS — R110 Nausea: Secondary | ICD-10-CM | POA: Diagnosis not present

## 2017-06-29 DIAGNOSIS — K5900 Constipation, unspecified: Secondary | ICD-10-CM | POA: Diagnosis present

## 2017-06-29 DIAGNOSIS — E1165 Type 2 diabetes mellitus with hyperglycemia: Secondary | ICD-10-CM

## 2017-06-29 DIAGNOSIS — Z87891 Personal history of nicotine dependence: Secondary | ICD-10-CM

## 2017-06-29 DIAGNOSIS — R17 Unspecified jaundice: Secondary | ICD-10-CM

## 2017-06-29 DIAGNOSIS — R109 Unspecified abdominal pain: Secondary | ICD-10-CM

## 2017-06-29 DIAGNOSIS — E1151 Type 2 diabetes mellitus with diabetic peripheral angiopathy without gangrene: Secondary | ICD-10-CM | POA: Diagnosis present

## 2017-06-29 DIAGNOSIS — K8080 Other cholelithiasis without obstruction: Principal | ICD-10-CM | POA: Diagnosis present

## 2017-06-29 DIAGNOSIS — I5042 Chronic combined systolic (congestive) and diastolic (congestive) heart failure: Secondary | ICD-10-CM | POA: Diagnosis not present

## 2017-06-29 DIAGNOSIS — N400 Enlarged prostate without lower urinary tract symptoms: Secondary | ICD-10-CM | POA: Diagnosis present

## 2017-06-29 DIAGNOSIS — Z8546 Personal history of malignant neoplasm of prostate: Secondary | ICD-10-CM

## 2017-06-29 DIAGNOSIS — IMO0002 Reserved for concepts with insufficient information to code with codable children: Secondary | ICD-10-CM | POA: Diagnosis present

## 2017-06-29 HISTORY — DX: Paroxysmal atrial fibrillation: I48.0

## 2017-06-29 HISTORY — DX: Chronic combined systolic (congestive) and diastolic (congestive) heart failure: I50.42

## 2017-06-29 LAB — CBC
HCT: 38.2 % — ABNORMAL LOW (ref 39.0–52.0)
Hemoglobin: 11.6 g/dL — ABNORMAL LOW (ref 13.0–17.0)
MCH: 31.1 pg (ref 26.0–34.0)
MCHC: 30.4 g/dL (ref 30.0–36.0)
MCV: 102.4 fL — AB (ref 78.0–100.0)
Platelets: 168 10*3/uL (ref 150–400)
RBC: 3.73 MIL/uL — ABNORMAL LOW (ref 4.22–5.81)
RDW: 15.7 % — AB (ref 11.5–15.5)
WBC: 7.3 10*3/uL (ref 4.0–10.5)

## 2017-06-29 LAB — COMPREHENSIVE METABOLIC PANEL
ALBUMIN: 4 g/dL (ref 3.5–5.0)
ALT: 97 U/L — ABNORMAL HIGH (ref 17–63)
AST: 78 U/L — AB (ref 15–41)
Alkaline Phosphatase: 120 U/L (ref 38–126)
Anion gap: 18 — ABNORMAL HIGH (ref 5–15)
BUN: 25 mg/dL — AB (ref 6–20)
CHLORIDE: 94 mmol/L — AB (ref 101–111)
CO2: 26 mmol/L (ref 22–32)
Calcium: 8.9 mg/dL (ref 8.9–10.3)
Creatinine, Ser: 5.94 mg/dL — ABNORMAL HIGH (ref 0.61–1.24)
GFR calc Af Amer: 9 mL/min — ABNORMAL LOW (ref >60)
GFR calc non Af Amer: 8 mL/min — ABNORMAL LOW (ref >60)
GLUCOSE: 175 mg/dL — AB (ref 65–99)
Potassium: 5.1 mmol/L (ref 3.5–5.1)
Sodium: 138 mmol/L (ref 135–145)
Total Bilirubin: 1.7 mg/dL — ABNORMAL HIGH (ref 0.3–1.2)
Total Protein: 7.4 g/dL (ref 6.5–8.1)

## 2017-06-29 LAB — LIPASE, BLOOD: LIPASE: 47 U/L (ref 11–51)

## 2017-06-29 LAB — CBG MONITORING, ED: Glucose-Capillary: 158 mg/dL — ABNORMAL HIGH (ref 65–99)

## 2017-06-29 MED ORDER — SODIUM CHLORIDE 0.9 % IV SOLN
Freq: Once | INTRAVENOUS | Status: AC
  Administered 2017-06-29: 22:00:00 via INTRAVENOUS

## 2017-06-29 MED ORDER — FENTANYL CITRATE (PF) 100 MCG/2ML IJ SOLN
50.0000 ug | Freq: Once | INTRAMUSCULAR | Status: AC
  Administered 2017-06-29: 50 ug via INTRAVENOUS
  Filled 2017-06-29: qty 2

## 2017-06-29 MED ORDER — PIPERACILLIN-TAZOBACTAM 3.375 G IVPB 30 MIN
3.3750 g | Freq: Once | INTRAVENOUS | Status: AC
  Administered 2017-06-30: 3.375 g via INTRAVENOUS
  Filled 2017-06-29: qty 50

## 2017-06-29 MED ORDER — ONDANSETRON HCL 4 MG/2ML IJ SOLN: 4.0000 mg | Freq: Once | INTRAMUSCULAR | Status: DC | PRN

## 2017-06-29 MED ORDER — ONDANSETRON HCL 4 MG/2ML IJ SOLN
4.0000 mg | Freq: Once | INTRAMUSCULAR | Status: AC
  Administered 2017-06-29: 4 mg via INTRAVENOUS
  Filled 2017-06-29: qty 2

## 2017-06-29 NOTE — Consult Note (Signed)
Surgical Consultation Requesting provider: Dr. Alvino Chapel  HPI: very pleasant 77 year old male with multiple severe comorbidities including end-stage renal disease on dialysis (M, W, F), congestive heart failure ( last echo in April 2018 showed EF 35-40%), type 2 diabetes and peripheral vascular disease status post bilateral BKA's, atrial fibrillation on chronic anicteric regulation with Coumadin, among other problems as listed below. He presents to the emergency room this evening with his wife, his daughter, and his niece complaining of mild upper abdominal pain, nausea and nonbilious nonbloody emesis for the last 2 days. Per his wife, he has had indigestion and upper abdominal pain for about 2 weeks and has been taking Tums with not much relief. The vomiting just began 2 days ago. The pain is in the upper abdomen and mid epigastrium without radiation, and has significantly improved after pain medication here in the ER. The pain is a pressure sensation does aggravated by eating. He reports his last bowel movement was Monday (3 days ago) that he continues to pass flatus. Denies any fevers or chills at home. Reports colonoscopy in 2017 that showed polyps without any other abnormalities. Emergency room workup has included a CMP which demonstrates mildly elevated AST, ALP and lipase as well as bilirubin of 1.7. White count is normal. Right upper quadrant ultrasound shows gallstones with equivocal gallbladder wall thickening and sonographic Murphy sign, no evidence of biliary obstruction and "questionable pneumobilia ". Chest x-ray shows pulmonary edema and bilateral effusions.  Allergies  Allergen Reactions  . Oxycodone Other (See Comments)    Hallucinations    Past Medical History:  Diagnosis Date  . Abnormality of gait   . Anemia   . BPH (benign prostatic hypertrophy)   . CHF (congestive heart failure) ([place])   . Complication of anesthesia    "he gets delirious"  . Constipation    takes Miralax  daily  . Coronary artery disease    a. s/p NSTEMI/CABGx4 in 2007 - LIMA-LAD, SVG-optional diagonal, SVVG-OM, SVG-dRCA. b. Nuc 05/2011: nonischemic.  . Diabetic peripheral neuropathy ([place])   . Dieulafoy lesion of rectum 05/03/2015  . Disorder of bone and cartilage, unspecified   . DVT (deep venous thrombosis) ([place])    a. Lower extremity DVT in 2013.  [name] Dyspnea   . ESRD on dialysis [facility])    M-W-F  . Gangrene of toe ([facility])    dry  . Hemorrhoids   . Hx of colonic polyps   . Hyperlipidemia   . Hypertension   . Hypertrophy of prostate without urinary obstruction and other lower urinary tract symptoms (LUTS)   . Ischemic cardiomyopathy    a. EF 40% in 2007. b. 51% by nuc 05/2011.  [name] Kidney stones   . Lower limb amputation, below knee   . Myocardial infarction ([place]) 2005  . Neuromuscular disorder ([place])    diabetic neuropathy  . NSTEMI (non-ST elevated myocardial infarction) ([place]) 04/2016  . PAD (peripheral artery disease) ([place])    a. R CEA 2007. b. Hx L BKA in 2013. c. s/p LE angioplasty in 2014. d. Hx R BKA in 2014.  [name] Peripheral neuropathy   . Prostate cancer ([place]) 2010  . Secondary hyperparathyroidism ([place])    Secondary Hyperpara- Thyroidism, Renal  . Type II diabetes mellitus ([place])     Past Surgical History:  Procedure Laterality Date  . ABDOMINAL AORTAGRAM N/A 05/28/2011   Procedure: ABDOMINAL Maxcine Ham;  Surgeon: Elam Dutch, MD;  Location: [facility] CATH LAB;  Service: Cardiovascular;  Laterality: N/A;  . ABDOMINAL  AORTAGRAM N/A 06/02/2012   Procedure: ABDOMINAL Maxcine Ham;  Surgeon: Elam Dutch, MD;  Location: [facility] CATH LAB;  Service: Cardiovascular;  Laterality: N/A;  . ABDOMINAL AORTAGRAM N/A 06/09/2012   Procedure: ABDOMINAL Maxcine Ham;  Surgeon: Elam Dutch, MD;  Location: [facility] CATH LAB;  Service: Cardiovascular;  Laterality: N/A;  . AMPUTATION  06/14/2011   Procedure: AMPUTATION DIGIT;  Surgeon: Elam Dutch, MD;  Location: [facility] OR;  Service: Vascular;  Laterality: Left;   Amputation Left fifth toe  . AMPUTATION  06/16/2011   Procedure: AMPUTATION BELOW KNEE;  Surgeon: Elam Dutch, MD;  Location: [place];  Service: Vascular;  Laterality: Left;  . AMPUTATION Right 06/12/2012   Procedure: AMPUTATION DIGIT;  Surgeon: Elam Dutch, MD;  Location: [place];  Service: Vascular;  Laterality: Right;  GREAT TOE  . AMPUTATION Right 08/08/2012   Procedure: AMPUTATION BELOW KNEE;  Surgeon: Elam Dutch, MD;  Location: [place];  Service: Vascular;  Laterality: Right;  . AV FISTULA PLACEMENT Right 04/19/2014   Procedure: RIGHT ARM ARTERIOVENOUS (AV) FISTULA CREATION;  Surgeon: Mal Misty, MD;  Location: [place];  Service: Vascular;  Laterality: Right;  . AV FISTULA PLACEMENT Left 12/12/2014   Procedure: BRACHIOCEPHALIC ARTERIOVENOUS (AV) FISTULA CREATION;  Surgeon: Mal Misty, MD;  Location: [place];  Service: Vascular;  Laterality: Left;  . CARDIAC CATHETERIZATION  05/28/11  . CARDIAC CATHETERIZATION N/A 10/04/2014   Procedure: Left Heart Cath and Coronary Angiography;  Surgeon: Sherren Mocha, MD;  Location: [place] CV LAB;  Service: Cardiovascular;  Laterality: N/A;  . CAROTID ENDARTERECTOMY Right 2005  . CATARACT EXTRACTION W/ INTRAOCULAR LENS  IMPLANT, BILATERAL Bilateral 2011  . COLONOSCOPY    . COLONOSCOPY N/A 05/03/2015   Procedure: COLONOSCOPY;  Surgeon: Gatha Mayer, MD;  Location: [place];  Service: Endoscopy;  Laterality: N/A;  . CORONARY ARTERY BYPASS GRAFT  2005   CABG X4  . ENDARTERECTOMY Left 10/17/2014   Procedure: ENDARTERECTOMY CAROTID;  Surgeon: Mal Misty, MD;  Location: [place];  Service: Vascular;  Laterality: Left;  . I&D EXTREMITY  01/31/2012   Procedure: IRRIGATION AND DEBRIDEMENT EXTREMITY;  Surgeon: Elam Dutch, MD;  Location: [place];  Service: Vascular;  Laterality: Left;  I & D Left BKA   . INSERTION OF DIALYSIS CATHETER Right 04/19/2014   Procedure: INSERTION OF DIALYSIS CATHETER-INTERNAL JUGULAR;  Surgeon: Mal Misty, MD;   Location: [place];  Service: Vascular;  Laterality: Right;  . INSERTION OF DIALYSIS CATHETER N/A 11/06/2014   Procedure: INSERTION OF 23cm DIALYSIS CATHETER - right internal jugular;  Surgeon: Mal Misty, MD;  Location: [place];  Service: Vascular;  Laterality: N/A;  . KNEE CARTILAGE SURGERY Left 1964  . LEFT HEART CATH AND CORONARY ANGIOGRAPHY N/A 04/12/2016   Procedure: Left Heart Cath and Coronary Angiography;  Surgeon: Nelva Bush, MD;  Location: [place] CV LAB;  Service: Cardiovascular;  Laterality: N/A;  . LIGATION OF ARTERIOVENOUS  FISTULA Right 11/06/2014   Procedure: LIGATION OF RADIOCEPHALIC ARTERIOVENOUS FISTULA;  Surgeon: Mal Misty, MD;  Location: [place];  Service: Vascular;  Laterality: Right;  . LIGATION OF COMPETING BRANCHES OF ARTERIOVENOUS FISTULA Right 06/19/2014   Procedure: Right Arm LIGATION OF COMPETING BRANCHES OF RADIOCEPHALIC ARTERIOVENOUS FISTULA;  Surgeon: Mal Misty, MD;  Location: [place];  Service: Vascular;  Laterality: Right;  . PROSTATECTOMY  2009    Family History  Problem Relation Age of Onset  . Hyperlipidemia Mother   . Hypertension  Mother   . Cancer Mother   . Hyperlipidemia Father   . Hypertension Father   . Kidney disease Father   . Heart disease Sister   . Alzheimer's disease Sister   . Cancer Brother   . Other Sister   . Coronary artery disease Neg Hx   . Anesthesia problems Neg Hx   . Hypotension Neg Hx   . Malignant hyperthermia Neg Hx   . Pseudochol deficiency Neg Hx     Social History   Socioeconomic History  . Marital status: Married    Spouse name: Not on file  . Number of children: 3  . Years of education: Not on file  . Highest education level: Not on file  Occupational History  . Occupation: Manufacturing systems engineer  Social Needs  . Financial resource strain: Not on file  . Food insecurity:    Worry: Not on file    Inability: Not on file  . Transportation needs:    Medical: Not on file    Non-medical: Not  on file  Tobacco Use  . Smoking status: Former Smoker    Packs/day: 1.00    Years: 30.00    Pack years: 30.00    Types: Cigarettes    Last attempt to quit: 04/28/1991    Years since quitting: 26.1  . Smokeless tobacco: Never Used  Substance and Sexual Activity  . Alcohol use: Yes    Comment: 04/17/2014 "might have a glass of wine at Christmas"  . Drug use: No  . Sexual activity: Never  Lifestyle  . Physical activity:    Days per week: Not on file    Minutes per session: Not on file  . Stress: Not on file  Relationships  . Social connections:    Talks on phone: Not on file    Gets together: Not on file    Attends religious service: Not on file    Active member of club or organization: Not on file    Attends meetings of clubs or organizations: Not on file    Relationship status: Not on file  Other Topics Concern  . Not on file  Social History Narrative  . Not on file    No current facility-administered medications on file prior to encounter.    Current Outpatient Medications on File Prior to Encounter  Medication Sig Dispense Refill  . amLODipine (NORVASC) 5 MG tablet Take 1 tablet (5 mg total) by mouth daily. Take one tablet by mouth once daily 30 tablet 3  . aspirin EC 81 MG tablet Take 81 mg by mouth daily.    Lorin Picket 1 GM 210 MG(Fe) tablet Take 420 mg by mouth 3 (three) times daily.  0  . calcium acetate (PHOSLO) 667 MG capsule Take 667 mg by mouth daily.    . carvedilol (COREG) 6.25 MG tablet Take 1 tablet (6.25 mg total) by mouth 2 (two) times daily with a meal. 60 tablet 0  . hydrALAZINE (APRESOLINE) 25 MG tablet Take 1 tablet (25 mg total) by mouth every 8 (eight) hours. 90 tablet 3  . isosorbide mononitrate (IMDUR) 30 MG 24 hr tablet Take 1 tablet (30 mg total) by mouth daily. 30 tablet 3  . LEVEMIR FLEXTOUCH 100 UNIT/ML Pen Inject 13 Units as directed daily.   0  . NOVOLOG FLEXPEN 100 UNIT/ML FlexPen CBG 70 - 120: 0 units  CBG 121 - 150: 1 unit  CBG 151 - 200: 2  units  CBG 201 - 250: 3  units  CBG 251 - 300: 5 units  CBG 301 - 350: 7 units  CBG 351 - 400: 9 units  0  . polyethylene glycol (MIRALAX) packet Take 17 g by mouth daily. 30 each 0  . pravastatin (PRAVACHOL) 40 MG tablet Take 40 mg by mouth daily.    . pregabalin (LYRICA) 75 MG capsule Take 75 mg by mouth as needed.     [name] QUEtiapine (SEROQUEL) 25 MG tablet Take 1 tablet (25 mg total) by mouth daily. (Patient taking differently: Take 25 mg by mouth as needed. ) 30 tablet 0  . warfarin (COUMADIN) 7.5 MG tablet Take 1 tablet (7.5 mg total) by mouth daily at 6 PM. (Patient taking differently: Take 7.5 mg by mouth See admin instructions. 7.5 mg every evening, EXCEPT Sunday take 11.25 mg) 30 tablet 0    Review of Systems: a complete, 10pt review of systems was completed with pertinent positives and negatives as documented in the HPI  Physical Exam: Vitals:   06/29/17 2200 06/29/17 2300  BP: (!) 169/80 (!) 168/73  Pulse: 83 81  Resp: 17 16  Temp:    SpO2: 93% 98%   Gen: A&Ox3, no distress  Head: normocephalic, atraumatic Eyes: extraocular motions intact, anicteric.  Neck: supple without mass or thyromegaly Chest: unlabored respirations, symmetrical air entry, bilateral wheeze Cardiovascular: RRR, palpable radial pulses. Left upper arm AVF Abdomen: soft, mildly distended, mildly tender to deep palpation in RUQ; no peritonitis. Hyperactive bowel sounds. No mass or organomegaly.  Extremities: mild right arm edema. Bilateral BKA Neuro: grossly intact Psych: appropriate mood and affect, normal insight  Skin: warm and dry   CBC Latest Ref Rng & Units 06/29/2017 07/06/2016 07/05/2016  WBC 4.0 - 10.5 K/uL 7.3 7.1 7.7  Hemoglobin 13.0 - 17.0 g/dL 11.6(L) 10.4(L) 10.0(L)  Hematocrit 39.0 - 52.0 % 38.2(L) 33.3(L) 31.6(L)  Platelets 150 - 400 K/uL 168 239 236    CMP Latest Ref Rng & Units 06/29/2017 07/05/2016 07/02/2016  Glucose 65 - 99 mg/dL 175(H) 199(H) 180(H)  BUN 6 - 20 mg/dL 25(H) 68(H)  37(H)  Creatinine 0.61 - 1.24 mg/dL 5.94(H) 8.67(H) 6.15(H)  Sodium 135 - 145 mmol/L 138 124(L) 131(L)  Potassium 3.5 - 5.1 mmol/L 5.1 5.4(H) 4.7  Chloride 101 - 111 mmol/L 94(L) 88(L) 94(L)  CO2 22 - 32 mmol/L 26 26 26   Calcium 8.9 - 10.3 mg/dL 8.9 9.3 9.2  Total Protein 6.5 - 8.1 g/dL 7.4 - -  Total Bilirubin 0.3 - 1.2 mg/dL 1.7(H) - -  Alkaline Phos 38 - 126 U/L 120 - -  AST 15 - 41 U/L 78(H) - -  ALT 17 - 63 U/L 97(H) - -    Lab Results  Component Value Date   INR 1.90 07/06/2016   INR 1.74 07/05/2016   INR 1.33 07/04/2016    Imaging: US Abdomen Limited Ruq  Result Date: 06/29/2017 CLINICAL DATA:  77 year old male with upper abdominal pain, nausea and vomiting onset today. Dialysis patient. EXAM: ULTRASOUND ABDOMEN LIMITED RIGHT UPPER QUADRANT COMPARISON:  CT Abdomen and Pelvis 02/17/2001 FINDINGS: Gallbladder: Positive small gallstones, individually estimated at 5 millimeters (image 12). Gallbladder wall thickening is at the upper limits of normal mildly increased, 3 millimeters. No pericholecystic fluid. Equivocal sonographic Murphy sign. Common bile duct: Diameter: 4 millimeters, normal. Liver: Liver echogenicity is within normal limits. No discrete liver lesion identified. No definite intrahepatic biliary ductal dilatation, but questionable pneumobilia (images 37 and 38). Portal vein is patent on color Doppler imaging  with normal direction of blood flow towards the liver. Other findings: Right pleural effusion is evident. Negative visible right kidney. IMPRESSION: 1. Cholelithiasis with equivocal gallbladder wall thickening and sonographic Murphy sign. Consider mild or early acute cholecystitis. 2. No evidence of biliary ductal obstruction. Questionable Pneumobilia, which can be a normal finding in patients who have undergone prior ERCP/sphincterotomy. 3. Right pleural effusion. Electronically Signed   By: Genevie Ann M.D.   On: 06/29/2017 23:25     A/P: 77yo man with multiple  comorbidities and likely early cholecystitis. His abdominal exam is benign at this point. He is an exceptionally high risk candidate for surgical intervention for multiple reasons as listed above. He is being admitted to the hospitalist service. -Trend labs; if increasing tbili will need GI cs and MRCP vs ERCP -Serial abdominal exams -recommend empiric antibiotics and symptom directed relief, including bowel regimen -nephrology consult to continue HD while inpatient -he will need cardiac clearance and normalization of INR prior to any intervention.   Will continue to follow.     Romana Juniper, MD [facility], [place] Pager [phone]

## 2017-06-29 NOTE — ED Triage Notes (Signed)
Pt from home via [facility]. C/o abd pain w/ n/v since earlier today. Hx dialysis MWF, missed Monday, but sts was able to go today and finished treatment. Denies any pain @ this time. A&O x4 on arrival.

## 2017-06-29 NOTE — ED Notes (Signed)
CBG 158  

## 2017-06-29 NOTE — ED Notes (Signed)
Patient transported to Ultrasound 

## 2017-06-29 NOTE — ED Provider Notes (Signed)
[facility] EMERGENCY DEPARTMENT Provider Note   CSN: [id number] Arrival date & time: 06/29/17  1906     History   Chief Complaint Chief Complaint  Patient presents with  . Abdominal Pain  . Emesis    HPI Aaron Long is a 77 y.o. male the past medical history of end-stage renal disease on dialysis (Monday Wednesday Friday), CHF, CAD hypertension, hyperlipidemia, diabetes who presents emergency department today for upper abdominal pain, nausea and emesis.  Patient reports that today after dialysis he went home and ate lunch when he developed upper abdominal pain and had nausea with one episode of nonbilious, nonbloody emesis.  He reports that his pain was in his epigastric area without radiation.  He reports that the pain lasted for less than 1 hour before resolving.  He describes this as a pressure.  He notes that when he ate again later that day he felt the same pain again and had nausea as well as emesis with this.  He reports he is currently pain-free but has had some continued nausea with another episode of nonbilious, nonbloody approximately 20 minutes ago.  He denies similar pain in the past.  He reports he has taken Tums for this without any relief.  Patient reports that he did miss dialysis on Monday but had a full episode of dialysis today.  He does have a history of CAD and MI but states this is not similar.  He denies any chest pain, cough or shortness of breath.  Patient has not had any previous abdominal surgeries.  He notes last bowel movement was on Monday normal.  No melena or hematochezia.  He notes he suffers from constipation takes MiraLAX on a daily basis.  He still passing gas.  He denies any urinary symptoms (he still producing urine).  No fever or chills at home.  He reports colonoscopy in 2017 that showed polyps without diverticuli or other abnormalities.  HPI  Past Medical History:  Diagnosis Date  . Abnormality of gait   . Anemia   . BPH  (benign prostatic hypertrophy)   . CHF (congestive heart failure) ([place])   . Complication of anesthesia    "he gets delirious"  . Constipation    takes Miralax daily  . Coronary artery disease    a. s/p NSTEMI/CABGx4 in 2007 - LIMA-LAD, SVG-optional diagonal, SVVG-OM, SVG-dRCA. b. Nuc 05/2011: nonischemic.  . Diabetic peripheral neuropathy ([place])   . Dieulafoy lesion of rectum 05/03/2015  . Disorder of bone and cartilage, unspecified   . DVT (deep venous thrombosis) ([place])    a. Lower extremity DVT in 2013.  [name] Dyspnea   . ESRD on dialysis [facility])    M-W-F  . Gangrene of toe ([facility])    dry  . Hemorrhoids   . Hx of colonic polyps   . Hyperlipidemia   . Hypertension   . Hypertrophy of prostate without urinary obstruction and other lower urinary tract symptoms (LUTS)   . Ischemic cardiomyopathy    a. EF 40% in 2007. b. 51% by nuc 05/2011.  [name] Kidney stones   . Lower limb amputation, below knee   . Myocardial infarction ([place]) 2005  . Neuromuscular disorder ([place])    diabetic neuropathy  . NSTEMI (non-ST elevated myocardial infarction) ([place]) 04/2016  . PAD (peripheral artery disease) ([place])    a. R CEA 2007. b. Hx L BKA in 2013. c. s/p LE angioplasty in 2014. d. Hx R BKA in 2014.  [name] Peripheral neuropathy   .  Prostate cancer ([place]) 2010  . Secondary hyperparathyroidism ([place])    Secondary Hyperpara- Thyroidism, Renal  . Type II diabetes mellitus ([place])     Patient Active Problem List   Diagnosis Date Noted  . New onset atrial fibrillation ([place]) 06/30/2016  . Atrial fibrillation ([place]) 06/29/2016  . Elevated troponin 04/10/2016  . Acute on chronic systolic and diastolic heart failure, NYHA class 4 ([place])   . Pulmonary edema   . NSTEMI (non-ST elevated myocardial infarction) ([place])   . CHF (congestive heart failure) ([place]) 04/09/2016  . Hereditary and idiopathic peripheral neuropathy 06/26/2015  . History of pressure ulcer 06/26/2015  . Dieulafoy lesion of rectum 05/03/2015  . Rectal bleeding  05/01/2015  . Acute blood loss anemia 05/01/2015  . Numbness and tingling in right hand 11/05/2014  . Right hand pain 11/05/2014  . Carotid stenosis   . TIA (transient ischemic attack) 09/30/2014  . ESRD on dialysis ([place])   . Anemia of chronic disease 05/02/2014  . Insomnia due to stress 05/02/2014  . Physical deconditioning 04/25/2014  . Syncope 04/17/2014  . Chest pain 04/08/2014  . Status post bilateral below knee amputation ([place]) 04/02/2014  . Cervical disc disorder with radiculopathy of cervical region 08/07/2013  . Drug-induced constipation 06/13/2013  . DM type 2, uncontrolled, with renal complications ([place]) 14/48/1856  . Hypertensive renal disease 06/13/2013  . Hyperlipidemia LDL goal <70 12/06/2012  . Diabetes mellitus with peripheral vascular disease ([place]) 09/06/2012  . GERD (gastroesophageal reflux disease) 09/06/2012  . Anemia in chronic renal disease 07/24/2012  . Atherosclerosis of native arteries of the extremities with ulceration(440.23) 06/01/2012  . Hyperkalemia 04/24/2012  . HTN (hypertension) 04/28/2011  . CAD (coronary artery disease) 04/28/2011  . PAD (peripheral artery disease) ([place]) 04/28/2011  . Prostate cancer ([place]) 04/28/2011    Past Surgical History:  Procedure Laterality Date  . ABDOMINAL AORTAGRAM N/A 05/28/2011   Procedure: ABDOMINAL Maxcine Ham;  Surgeon: Elam Dutch, MD;  Location: [facility] CATH LAB;  Service: Cardiovascular;  Laterality: N/A;  . ABDOMINAL AORTAGRAM N/A 06/02/2012   Procedure: ABDOMINAL Maxcine Ham;  Surgeon: Elam Dutch, MD;  Location: [facility] CATH LAB;  Service: Cardiovascular;  Laterality: N/A;  . ABDOMINAL AORTAGRAM N/A 06/09/2012   Procedure: ABDOMINAL Maxcine Ham;  Surgeon: Elam Dutch, MD;  Location: [facility] CATH LAB;  Service: Cardiovascular;  Laterality: N/A;  . AMPUTATION  06/14/2011   Procedure: AMPUTATION DIGIT;  Surgeon: Elam Dutch, MD;  Location: [facility] OR;  Service: Vascular;  Laterality: Left;  Amputation Left fifth toe  .  AMPUTATION  06/16/2011   Procedure: AMPUTATION BELOW KNEE;  Surgeon: Elam Dutch, MD;  Location: [place];  Service: Vascular;  Laterality: Left;  . AMPUTATION Right 06/12/2012   Procedure: AMPUTATION DIGIT;  Surgeon: Elam Dutch, MD;  Location: [place];  Service: Vascular;  Laterality: Right;  GREAT TOE  . AMPUTATION Right 08/08/2012   Procedure: AMPUTATION BELOW KNEE;  Surgeon: Elam Dutch, MD;  Location: [place];  Service: Vascular;  Laterality: Right;  . AV FISTULA PLACEMENT Right 04/19/2014   Procedure: RIGHT ARM ARTERIOVENOUS (AV) FISTULA CREATION;  Surgeon: Mal Misty, MD;  Location: [place];  Service: Vascular;  Laterality: Right;  . AV FISTULA PLACEMENT Left 12/12/2014   Procedure: BRACHIOCEPHALIC ARTERIOVENOUS (AV) FISTULA CREATION;  Surgeon: Mal Misty, MD;  Location: [place];  Service: Vascular;  Laterality: Left;  . CARDIAC CATHETERIZATION  05/28/11  . CARDIAC CATHETERIZATION N/A 10/04/2014   Procedure: Left Heart Cath and Coronary Angiography;  Surgeon: Sherren Mocha, MD;  Location: [facility] CV LAB;  Service: Cardiovascular;  Laterality: N/A;  . CAROTID ENDARTERECTOMY Right 2005  . CATARACT EXTRACTION W/ INTRAOCULAR LENS  IMPLANT, BILATERAL Bilateral 2011  . COLONOSCOPY    . COLONOSCOPY N/A 05/03/2015   Procedure: COLONOSCOPY;  Surgeon: Gatha Mayer, MD;  Location: [place];  Service: Endoscopy;  Laterality: N/A;  . CORONARY ARTERY BYPASS GRAFT  2005   CABG X4  . ENDARTERECTOMY Left 10/17/2014   Procedure: ENDARTERECTOMY CAROTID;  Surgeon: Mal Misty, MD;  Location: [facility];  Service: Vascular;  Laterality: Left;  . I&D EXTREMITY  01/31/2012   Procedure: IRRIGATION AND DEBRIDEMENT EXTREMITY;  Surgeon: Elam Dutch, MD;  Location: [place];  Service: Vascular;  Laterality: Left;  I & D Left BKA   . INSERTION OF DIALYSIS CATHETER Right 04/19/2014   Procedure: INSERTION OF DIALYSIS CATHETER-INTERNAL JUGULAR;  Surgeon: Mal Misty, MD;  Location: [place];  Service:  Vascular;  Laterality: Right;  . INSERTION OF DIALYSIS CATHETER N/A 11/06/2014   Procedure: INSERTION OF 23cm DIALYSIS CATHETER - right internal jugular;  Surgeon: Mal Misty, MD;  Location: [place];  Service: Vascular;  Laterality: N/A;  . KNEE CARTILAGE SURGERY Left 1964  . LEFT HEART CATH AND CORONARY ANGIOGRAPHY N/A 04/12/2016   Procedure: Left Heart Cath and Coronary Angiography;  Surgeon: Nelva Bush, MD;  Location: [place] CV LAB;  Service: Cardiovascular;  Laterality: N/A;  . LIGATION OF ARTERIOVENOUS  FISTULA Right 11/06/2014   Procedure: LIGATION OF RADIOCEPHALIC ARTERIOVENOUS FISTULA;  Surgeon: Mal Misty, MD;  Location: [place];  Service: Vascular;  Laterality: Right;  . LIGATION OF COMPETING BRANCHES OF ARTERIOVENOUS FISTULA Right 06/19/2014   Procedure: Right Arm LIGATION OF COMPETING BRANCHES OF RADIOCEPHALIC ARTERIOVENOUS FISTULA;  Surgeon: Mal Misty, MD;  Location: [place];  Service: Vascular;  Laterality: Right;  . PROSTATECTOMY  2009        Home Medications    Prior to Admission medications   Medication Sig Start Date End Date Taking? Authorizing Provider  amLODipine (NORVASC) 5 MG tablet Take 1 tablet (5 mg total) by mouth daily. Take one tablet by mouth once daily 04/13/16   Rai, Vernelle Emerald, MD  aspirin EC 81 MG tablet Take 81 mg by mouth daily.    [provider]  AURYXIA 1 GM 210 MG(Fe) tablet Take 420 mg by mouth 3 (three) times daily. 03/18/16   [provider]  carvedilol (COREG) 6.25 MG tablet Take 1 tablet (6.25 mg total) by mouth 2 (two) times daily with a meal. 07/06/16   [name], MD  clopidogrel (PLAVIX) 75 MG tablet Take 75 mg by mouth daily.    [provider]  hydrALAZINE (APRESOLINE) 25 MG tablet Take 1 tablet (25 mg total) by mouth every 8 (eight) hours. 04/13/16   Rai, Vernelle Emerald, MD  HYDROcodone-acetaminophen (NORCO/VICODIN) 5-325 MG tablet Take 1 tablet by mouth every 8 (eight) hours as needed for moderate pain.     [provider]  isosorbide mononitrate (IMDUR) 30 MG 24 hr tablet Take 1 tablet (30 mg total) by mouth daily. 04/14/16   Rai, Ripudeep K, MD  LEVEMIR FLEXTOUCH 100 UNIT/ML Pen Inject 13 Units as directed 2 (two) times a week. 04/05/16   [provider]  NOVOLOG FLEXPEN 100 UNIT/ML FlexPen CBG 70 - 120: 0 units  CBG 121 - 150: 1 unit  CBG 151 - 200: 2 units  CBG 201 - 250: 3  units  CBG 251 - 300: 5 units  CBG 301 - 350: 7 units  CBG 351 - 400: 9 units 04/06/16   [provider]  polyethylene glycol (MIRALAX) packet Take 17 g by mouth daily. 04/05/16   Estill Dooms, MD  pravastatin (PRAVACHOL) 40 MG tablet Take 40 mg by mouth daily.    [provider]  pregabalin (LYRICA) 75 MG capsule Take 75 mg by mouth 2 (two) times daily.    [provider]  QUEtiapine (SEROQUEL) 25 MG tablet Take 1 tablet (25 mg total) by mouth daily. 04/05/16   Estill Dooms, MD  tamsulosin (FLOMAX) 0.4 MG CAPS capsule Take 0.4 mg by mouth daily.    [provider]  warfarin (COUMADIN) 7.5 MG tablet Take 1 tablet (7.5 mg total) by mouth daily at 6 PM. 07/06/16   [name], MD    Family History Family History  Problem Relation Age of Onset  . Hyperlipidemia Mother   . Hypertension Mother   . Cancer Mother   . Hyperlipidemia Father   . Hypertension Father   . Kidney disease Father   . Heart disease Sister   . Alzheimer's disease Sister   . Cancer Brother   . Other Sister   . Coronary artery disease Neg Hx   . Anesthesia problems Neg Hx   . Hypotension Neg Hx   . Malignant hyperthermia Neg Hx   . Pseudochol deficiency Neg Hx     Social History Social History   Tobacco Use  . Smoking status: Former Smoker    Packs/day: 1.00    Years: 30.00    Pack years: 30.00    Types: Cigarettes    Last attempt to quit: 04/28/1991    Years since quitting: 26.1  . Smokeless tobacco: Never Used  Substance Use Topics  . Alcohol use: Yes    Comment: 04/17/2014  "might have a glass of wine at Christmas"  . Drug use: No     Allergies   Oxycodone   Review of Systems Review of Systems  All other systems reviewed and are negative.    Physical Exam Updated Vital Signs BP (!) 160/92 (BP Location: Right Arm)   Pulse (!) 104   Temp 99 F (37.2 C) (Oral)   Resp 19   Wt 77.1 kg (170 lb)   SpO2 92%   BMI 25.85 kg/m   Physical Exam  Constitutional: He appears well-developed and well-nourished.  HENT:  Head: Normocephalic and atraumatic.  Right Ear: External ear normal.  Left Ear: External ear normal.  Nose: Nose normal.  Mouth/Throat: Uvula is midline, oropharynx is clear and moist and mucous membranes are normal. No tonsillar exudate.  Eyes: Pupils are equal, round, and reactive to light. Right eye exhibits no discharge. Left eye exhibits no discharge. No scleral icterus.  Neck: Trachea normal. Neck supple. No spinous process tenderness present. No neck rigidity. Normal range of motion present.  Cardiovascular: Normal rate, regular rhythm and intact distal pulses.  No murmur heard. Pulses:      Radial pulses are 2+ on the right side, and 2+ on the left side.  Pulmonary/Chest: Effort normal and breath sounds normal. He exhibits no tenderness.  Abdominal: Soft. Bowel sounds are normal. He exhibits no distension. There is tenderness in the right upper quadrant and epigastric area. There is positive Murphy's sign. There is no rigidity, no rebound, no guarding and no CVA tenderness.  Musculoskeletal: He exhibits no edema.  Bilateral below the knee  amputations  Lymphadenopathy:    He has no cervical adenopathy.  Neurological: He is alert.  Skin: Skin is warm and dry. Capillary refill takes less than 2 seconds. No rash noted. He is not diaphoretic.  No jaundice  Psychiatric: He has a normal mood and affect.  Nursing note and vitals reviewed.  ED Treatments / Results  Labs (all labs ordered are listed, but only abnormal results are  displayed) Labs Reviewed  COMPREHENSIVE METABOLIC PANEL - Abnormal; Notable for the following components:      Result Value   Chloride 94 (*)    Glucose, Bld 175 (*)    BUN 25 (*)    Creatinine, Ser 5.94 (*)    AST 78 (*)    ALT 97 (*)    Total Bilirubin 1.7 (*)    GFR calc non Af Amer 8 (*)    GFR calc Af Amer 9 (*)    Anion gap 18 (*)    All other components within normal limits  CBC - Abnormal; Notable for the following components:   RBC 3.73 (*)    Hemoglobin 11.6 (*)    HCT 38.2 (*)    MCV 102.4 (*)    RDW 15.7 (*)    All other components within normal limits  PROTIME-INR - Abnormal; Notable for the following components:   Prothrombin Time 16.5 (*)    All other components within normal limits  GLUCOSE, CAPILLARY - Abnormal; Notable for the following components:   Glucose-Capillary 171 (*)    All other components within normal limits  CBG MONITORING, ED - Abnormal; Notable for the following components:   Glucose-Capillary 158 (*)    All other components within normal limits  LIPASE, BLOOD  URINALYSIS, ROUTINE W REFLEX MICROSCOPIC  BASIC METABOLIC PANEL  HEPATIC FUNCTION PANEL  CBC WITH DIFFERENTIAL/PLATELET    EKG EKG Interpretation  Date/Time:  Wednesday June 29 2017 19:28:25 EDT Ventricular Rate:  103 PR Interval:    QRS Duration: 124 QT Interval:  375 QTC Calculation: 491 R Axis:   9 Text Interpretation:  Sinus tachycardia Ventricular premature complex Probable left atrial enlargement Nonspecific intraventricular conduction delay Nonspecific repol abnormality, lateral leads No significant change since last tracing Confirmed by Davonna Belling [phone]) on 06/29/2017 8:57:13 PM   Radiology US Abdomen Limited Ruq  Result Date: 06/29/2017 CLINICAL DATA:  77 year old male with upper abdominal pain, nausea and vomiting onset today. Dialysis patient. EXAM: ULTRASOUND ABDOMEN LIMITED RIGHT UPPER QUADRANT COMPARISON:  CT Abdomen and Pelvis 02/17/2001 FINDINGS:  Gallbladder: Positive small gallstones, individually estimated at 5 millimeters (image 12). Gallbladder wall thickening is at the upper limits of normal mildly increased, 3 millimeters. No pericholecystic fluid. Equivocal sonographic Murphy sign. Common bile duct: Diameter: 4 millimeters, normal. Liver: Liver echogenicity is within normal limits. No discrete liver lesion identified. No definite intrahepatic biliary ductal dilatation, but questionable pneumobilia (images 37 and 38). Portal vein is patent on color Doppler imaging with normal direction of blood flow towards the liver. Other findings: Right pleural effusion is evident. Negative visible right kidney. IMPRESSION: 1. Cholelithiasis with equivocal gallbladder wall thickening and sonographic Murphy sign. Consider mild or early acute cholecystitis. 2. No evidence of biliary ductal obstruction. Questionable Pneumobilia, which can be a normal finding in patients who have undergone prior ERCP/sphincterotomy. 3. Right pleural effusion. Electronically Signed   By: Genevie Ann M.D.   On: 06/29/2017 23:25    Procedures Procedures (including critical care time)  Medications Ordered in ED Medications  ondansetron (ZOFRAN)  injection 4 mg (has no administration in time range)  fentaNYL (SUBLIMAZE) injection 50 mcg (has no administration in time range)  ondansetron (ZOFRAN) injection 4 mg (has no administration in time range)     Initial Impression / Assessment and Plan / ED Course  I have reviewed the triage vital signs and the nursing notes.  Pertinent labs & imaging results that were available during my care of the patient were reviewed by me and considered in my medical decision making (see chart for details).      77 year old male with a history of CKD on hemodialysis presenting with upper abdominal pain with associated nausea & vomiting.  Pain began this morning morning after eating. Emesis is non-bilious and non-bloody in nature. He denies any  chest pain or shortness of breath.  Vitals signs are reassuring on presentation.  He is without fever, tachycardia, tachypnea, hypoxia or hypotension. On exam the patient is with epigastric and right upper quadrant tenderness on occasion.  He has a positive Murphy sign on exam. Will evaluate for gallbladder pathology with [place]. If [place] is negative will need CT vs CTA to evaluate pain further. Labs, UA, EKG, and pain medication ordered.   Labs and imaging reviewed.  Patient ultrasound with early acute cholecystitis.  Patient had gallbladder wall thickening, sonographic Murphy sign and gallstones.  Common bile duct within normal limits.  Patient without leukocytosis.  Lipase is within normal.  Patient is also noted to have elevated AST, ALT and total bilirubin at 1.7.  Spoke with Dr. Kae Heller of general surgery who recommended hospitalist admission and they will follow.  Spoke with pharmacist, Verdene Lennert who recommended 3.375 g of Zosyn for abx coverage.   Labs reviewed and otherwise overall reassuring.   I appreciate Dr. Hal Hope for bringing the patient into the hospital. Family at bedside and patient are in agreement with plan.   Patient case discussed with Dr. Alvino Chapel who is in agreement with plan.  Final Clinical Impressions(s) / ED Diagnoses   Final diagnoses:  Upper abdominal pain  Symptomatic cholelithiasis  Elevated bilirubin  History of hemodialysis    ED Discharge Orders    None       Lorelle Gibbs 06/30/17 [id number]    Davonna Belling, MD 07/01/17 [phone]

## 2017-06-29 NOTE — ED Notes (Signed)
ED Provider at bedside. 

## 2017-06-29 NOTE — ED Triage Notes (Addendum)
Pt coming from home via [facility] with c/o vomitting x1 day and abdominal pain. Pt states no current abdominal pain, but had some earlier. Pt reports 2 episodes of vomiting. Pt denies diarrhea. Pt does MWF dialysis and had full tx today. Pt restricted on left side.

## 2017-06-30 ENCOUNTER — Inpatient Hospital Stay (HOSPITAL_COMMUNITY): Payer: Medicare Other

## 2017-06-30 ENCOUNTER — Encounter (HOSPITAL_COMMUNITY): Payer: Self-pay | Admitting: Internal Medicine

## 2017-06-30 ENCOUNTER — Other Ambulatory Visit: Payer: Self-pay

## 2017-06-30 DIAGNOSIS — K8020 Calculus of gallbladder without cholecystitis without obstruction: Secondary | ICD-10-CM

## 2017-06-30 DIAGNOSIS — Z89511 Acquired absence of right leg below knee: Secondary | ICD-10-CM | POA: Diagnosis not present

## 2017-06-30 DIAGNOSIS — I255 Ischemic cardiomyopathy: Secondary | ICD-10-CM | POA: Diagnosis present

## 2017-06-30 DIAGNOSIS — E1142 Type 2 diabetes mellitus with diabetic polyneuropathy: Secondary | ICD-10-CM | POA: Diagnosis present

## 2017-06-30 DIAGNOSIS — I5042 Chronic combined systolic (congestive) and diastolic (congestive) heart failure: Secondary | ICD-10-CM

## 2017-06-30 DIAGNOSIS — N186 End stage renal disease: Secondary | ICD-10-CM | POA: Diagnosis not present

## 2017-06-30 DIAGNOSIS — J811 Chronic pulmonary edema: Secondary | ICD-10-CM | POA: Diagnosis not present

## 2017-06-30 DIAGNOSIS — Z8546 Personal history of malignant neoplasm of prostate: Secondary | ICD-10-CM | POA: Diagnosis not present

## 2017-06-30 DIAGNOSIS — I252 Old myocardial infarction: Secondary | ICD-10-CM | POA: Diagnosis not present

## 2017-06-30 DIAGNOSIS — I4891 Unspecified atrial fibrillation: Secondary | ICD-10-CM

## 2017-06-30 DIAGNOSIS — I480 Paroxysmal atrial fibrillation: Secondary | ICD-10-CM | POA: Diagnosis not present

## 2017-06-30 DIAGNOSIS — Z87891 Personal history of nicotine dependence: Secondary | ICD-10-CM | POA: Diagnosis not present

## 2017-06-30 DIAGNOSIS — K5900 Constipation, unspecified: Secondary | ICD-10-CM | POA: Diagnosis present

## 2017-06-30 DIAGNOSIS — E1122 Type 2 diabetes mellitus with diabetic chronic kidney disease: Secondary | ICD-10-CM | POA: Diagnosis present

## 2017-06-30 DIAGNOSIS — K8080 Other cholelithiasis without obstruction: Secondary | ICD-10-CM | POA: Diagnosis present

## 2017-06-30 DIAGNOSIS — Z89512 Acquired absence of left leg below knee: Secondary | ICD-10-CM

## 2017-06-30 DIAGNOSIS — E1165 Type 2 diabetes mellitus with hyperglycemia: Secondary | ICD-10-CM | POA: Diagnosis not present

## 2017-06-30 DIAGNOSIS — Z992 Dependence on renal dialysis: Secondary | ICD-10-CM | POA: Diagnosis not present

## 2017-06-30 DIAGNOSIS — R112 Nausea with vomiting, unspecified: Secondary | ICD-10-CM | POA: Diagnosis not present

## 2017-06-30 DIAGNOSIS — N2581 Secondary hyperparathyroidism of renal origin: Secondary | ICD-10-CM | POA: Diagnosis present

## 2017-06-30 DIAGNOSIS — R1011 Right upper quadrant pain: Secondary | ICD-10-CM | POA: Diagnosis not present

## 2017-06-30 DIAGNOSIS — E1129 Type 2 diabetes mellitus with other diabetic kidney complication: Secondary | ICD-10-CM | POA: Diagnosis not present

## 2017-06-30 DIAGNOSIS — I25118 Atherosclerotic heart disease of native coronary artery with other forms of angina pectoris: Secondary | ICD-10-CM | POA: Diagnosis not present

## 2017-06-30 DIAGNOSIS — I10 Essential (primary) hypertension: Secondary | ICD-10-CM | POA: Diagnosis not present

## 2017-06-30 DIAGNOSIS — I132 Hypertensive heart and chronic kidney disease with heart failure and with stage 5 chronic kidney disease, or end stage renal disease: Secondary | ICD-10-CM | POA: Diagnosis present

## 2017-06-30 DIAGNOSIS — Z951 Presence of aortocoronary bypass graft: Secondary | ICD-10-CM | POA: Diagnosis not present

## 2017-06-30 DIAGNOSIS — R1010 Upper abdominal pain, unspecified: Secondary | ICD-10-CM

## 2017-06-30 DIAGNOSIS — E1151 Type 2 diabetes mellitus with diabetic peripheral angiopathy without gangrene: Secondary | ICD-10-CM | POA: Diagnosis present

## 2017-06-30 DIAGNOSIS — Z9289 Personal history of other medical treatment: Secondary | ICD-10-CM | POA: Diagnosis not present

## 2017-06-30 DIAGNOSIS — I2510 Atherosclerotic heart disease of native coronary artery without angina pectoris: Secondary | ICD-10-CM | POA: Diagnosis present

## 2017-06-30 DIAGNOSIS — D631 Anemia in chronic kidney disease: Secondary | ICD-10-CM | POA: Diagnosis not present

## 2017-06-30 DIAGNOSIS — Z86718 Personal history of other venous thrombosis and embolism: Secondary | ICD-10-CM | POA: Diagnosis not present

## 2017-06-30 DIAGNOSIS — Z79899 Other long term (current) drug therapy: Secondary | ICD-10-CM | POA: Diagnosis not present

## 2017-06-30 DIAGNOSIS — I120 Hypertensive chronic kidney disease with stage 5 chronic kidney disease or end stage renal disease: Secondary | ICD-10-CM | POA: Diagnosis not present

## 2017-06-30 DIAGNOSIS — E785 Hyperlipidemia, unspecified: Secondary | ICD-10-CM | POA: Diagnosis present

## 2017-06-30 DIAGNOSIS — N400 Enlarged prostate without lower urinary tract symptoms: Secondary | ICD-10-CM | POA: Diagnosis present

## 2017-06-30 DIAGNOSIS — R109 Unspecified abdominal pain: Secondary | ICD-10-CM | POA: Diagnosis not present

## 2017-06-30 LAB — GLUCOSE, CAPILLARY
GLUCOSE-CAPILLARY: 171 mg/dL — AB (ref 65–99)
GLUCOSE-CAPILLARY: 152 mg/dL — AB (ref 65–99)
GLUCOSE-CAPILLARY: 144 mg/dL — AB (ref 65–99)
GLUCOSE-CAPILLARY: 157 mg/dL — AB (ref 65–99)
Glucose-Capillary: 107 mg/dL — ABNORMAL HIGH (ref 65–99)
Glucose-Capillary: 230 mg/dL — ABNORMAL HIGH (ref 65–99)

## 2017-06-30 LAB — CBC WITH DIFFERENTIAL/PLATELET
Abs Immature Granulocytes: 0 10*3/uL (ref 0.0–0.1)
Basophils Absolute: 0.1 10*3/uL (ref 0.0–0.1)
Basophils Relative: 1 %
EOS ABS: 0 10*3/uL (ref 0.0–0.7)
EOS PCT: 1 %
HEMATOCRIT: 35.7 % — AB (ref 39.0–52.0)
Hemoglobin: 11.1 g/dL — ABNORMAL LOW (ref 13.0–17.0)
Immature Granulocytes: 0 %
LYMPHS ABS: 1.4 10*3/uL (ref 0.7–4.0)
Lymphocytes Relative: 19 %
MCH: 31.4 pg (ref 26.0–34.0)
MCHC: 31.1 g/dL (ref 30.0–36.0)
MCV: 101.1 fL — AB (ref 78.0–100.0)
MONOS PCT: 11 %
Monocytes Absolute: 0.8 10*3/uL (ref 0.1–1.0)
Neutro Abs: 5 10*3/uL (ref 1.7–7.7)
Neutrophils Relative %: 68 %
Platelets: 145 10*3/uL — ABNORMAL LOW (ref 150–400)
RBC: 3.53 MIL/uL — AB (ref 4.22–5.81)
RDW: 15.8 % — ABNORMAL HIGH (ref 11.5–15.5)
WBC: 7.3 10*3/uL (ref 4.0–10.5)

## 2017-06-30 LAB — BASIC METABOLIC PANEL
ANION GAP: 17 — AB (ref 5–15)
BUN: 28 mg/dL — ABNORMAL HIGH (ref 6–20)
CHLORIDE: 97 mmol/L — AB (ref 101–111)
CO2: 26 mmol/L (ref 22–32)
Calcium: 8.5 mg/dL — ABNORMAL LOW (ref 8.9–10.3)
Creatinine, Ser: 6.33 mg/dL — ABNORMAL HIGH (ref 0.61–1.24)
GFR calc non Af Amer: 8 mL/min — ABNORMAL LOW (ref >60)
GFR, EST AFRICAN AMERICAN: 9 mL/min — AB (ref >60)
Glucose, Bld: 161 mg/dL — ABNORMAL HIGH (ref 65–99)
POTASSIUM: 4.2 mmol/L (ref 3.5–5.1)
SODIUM: 140 mmol/L (ref 135–145)

## 2017-06-30 LAB — HEPATIC FUNCTION PANEL
ALBUMIN: 3.6 g/dL (ref 3.5–5.0)
ALK PHOS: 103 U/L (ref 38–126)
ALT: 96 U/L — AB (ref 17–63)
AST: 64 U/L — AB (ref 15–41)
BILIRUBIN TOTAL: 0.8 mg/dL (ref 0.3–1.2)
Bilirubin, Direct: 0.2 mg/dL (ref 0.1–0.5)
Indirect Bilirubin: 0.6 mg/dL (ref 0.3–0.9)
TOTAL PROTEIN: 6.7 g/dL (ref 6.5–8.1)

## 2017-06-30 LAB — PROTIME-INR
INR: 1.35
Prothrombin Time: 16.5 seconds — ABNORMAL HIGH (ref 11.4–15.2)

## 2017-06-30 LAB — MRSA PCR SCREENING: MRSA by PCR: NEGATIVE

## 2017-06-30 MED ORDER — SODIUM CHLORIDE 0.9 % IV SOLN
INTRAVENOUS | Status: DC
  Administered 2017-06-30: 03:00:00 via INTRAVENOUS

## 2017-06-30 MED ORDER — HYDRALAZINE HCL 20 MG/ML IJ SOLN: 10.0000 mg | INTRAMUSCULAR | Status: DC | PRN

## 2017-06-30 MED ORDER — PIPERACILLIN-TAZOBACTAM 3.375 G IVPB
3.3750 g | Freq: Two times a day (BID) | INTRAVENOUS | Status: DC
  Administered 2017-06-30 (×2): 3.375 g via INTRAVENOUS
  Filled 2017-06-30 (×3): qty 50

## 2017-06-30 MED ORDER — ACETAMINOPHEN 325 MG PO TABS: 650.0000 mg | ORAL_TABLET | Freq: Four times a day (QID) | ORAL | Status: DC | PRN

## 2017-06-30 MED ORDER — CHLORHEXIDINE GLUCONATE CLOTH 2 % EX PADS: 6.0000 | MEDICATED_PAD | Freq: Every day | CUTANEOUS | Status: DC

## 2017-06-30 MED ORDER — METOPROLOL TARTRATE 5 MG/5ML IV SOLN
2.5000 mg | Freq: Three times a day (TID) | INTRAVENOUS | Status: DC
  Administered 2017-06-30 (×3): 2.5 mg via INTRAVENOUS
  Filled 2017-06-30 (×4): qty 5

## 2017-06-30 MED ORDER — ACETAMINOPHEN 650 MG RE SUPP: 650.0000 mg | Freq: Four times a day (QID) | RECTAL | Status: DC | PRN

## 2017-06-30 MED ORDER — ONDANSETRON HCL 4 MG/2ML IJ SOLN: 4.0000 mg | Freq: Four times a day (QID) | INTRAMUSCULAR | Status: DC | PRN

## 2017-06-30 MED ORDER — INSULIN ASPART 100 UNIT/ML ~~LOC~~ SOLN
0.0000 [IU] | SUBCUTANEOUS | Status: DC
  Administered 2017-06-30: 1 [IU] via SUBCUTANEOUS
  Administered 2017-06-30: 3 [IU] via SUBCUTANEOUS
  Administered 2017-06-30: 2 [IU] via SUBCUTANEOUS

## 2017-06-30 MED ORDER — ONDANSETRON HCL 4 MG PO TABS: 4.0000 mg | ORAL_TABLET | Freq: Four times a day (QID) | ORAL | Status: DC | PRN

## 2017-06-30 NOTE — Consult Note (Addendum)
Cardiology Consultation:   Patient ID: Aaron Long; [id number]; 1940/05/22   Admit date: 06/29/2017 Date of Consult: 06/30/2017  Primary Care Provider: Bartholome Bill, MD Primary Cardiologist: [name], MD   Patient Profile:   Aaron Long is a 77 y.o. male with a hx of CAD s/p 4V CABG 2007, ischemic cardiomyopathy/chronic combined CHF, ESRD on HD MWF, PAF, LE DVT 2013, HTN, DM, carotid artery disease, PAD s/p LE intervention and bilateral BKA (followed by vascular), chronic appearing anemia who is being seen today for the evaluation of pre-operative clearance at the request of Dr. Karleen Hampshire.  History of Present Illness:   He underwent 4V CABG in 2007 (LIMA to LAD, SVG to Diagonal, SVG to OM, SVG to distal RCA). He was followed thereafter then lost to followup between 2013 and 2018. Last cath was in 04/2016 in the setting of volume overload and elevated troponin - showed all bypass grafts widely patent, 80% stenosis involving the grafted OM proximal to SVG anastomosis, which backfills the distal LCx, moderately elevated LVEDP, EF 25%. 2D echo same admission 04/11/17 showed EF 35-40%, grade 1 DD. Fluid removal via HD was recommended. He was admitted again in June 2018 with new onset atrial fibrillation. He spontaneously converted to NSR. Plavix was stopped and he was discharged on Coumadin. He last saw Dr. Angelena Form in 07/2016 and was felt to be stable. He was supposed to come back in 6 months but did not do so. His vascular disease has been followed by [facility]. INRs are followed by family medicine at [facility].  He presented with abdominal pain and post-prandial vomiting for 2 days, mostly in the upper quadrants. Abdominal ultrasound was concerning for gallstones with possible cholecystitis and pneumobilia. Labs otherwise show mild anemia with Hgb in 11 range, normal WBC, afebrile, INR 1.35,  AST/ALT mildly elevated, Tbili 1.7k. He was admitted for IV antibiotics. General surgery was  consulted who plans for HIDA to determine further plans. Per their note, "If his HIDA is positive, then we will have to discuss risks vs benefits of surgery vs abx therapy vs drain placement.  If his HIDA is negative, then will still need to determine if he will need his GB out at some point secondary to biliary colic." HIDA scan result shows normal GB EF and patent ducts although patient told me he was unable to tolerate the whole procedure so plan not yet outlined.  From a cardiac standpoint he's not had any recent angina. He is extremely sedentary and it sounds like the most exertion he gets is helping to dress himself. He mobilizes with an Clinical research associate. He does not exercise or self-ambulate. He gets chronic dyspnea with ADLs requiring him to rest but this is not new for him. This is unchanged. No orthopnea, palpitations or syncope.  Past Medical History:  Diagnosis Date  . Abnormality of gait   . Anemia   . BPH (benign prostatic hypertrophy)   . CHF (congestive heart failure) ([place])   . Complication of anesthesia    "he gets delirious"  . Constipation    takes Miralax daily  . Coronary artery disease    a. s/p NSTEMI/CABGx4 in 2007 - LIMA-LAD, SVG-optional diagonal, SVVG-OM, SVG-dRCA. b. Nuc 05/2011: nonischemic.  . Diabetic peripheral neuropathy ([place])   . Dieulafoy lesion of rectum 05/03/2015  . Disorder of bone and cartilage, unspecified   . DVT (deep venous thrombosis) ([place])    a. Lower extremity DVT in 2013.  [name] Dyspnea   .  ESRD on dialysis [facility])    M-W-F  . Gangrene of toe (HCC)    dry  . Hemorrhoids   . Hx of colonic polyps   . Hyperlipidemia   . Hypertension   . Hypertrophy of prostate without urinary obstruction and other lower urinary tract symptoms (LUTS)   . Ischemic cardiomyopathy    a. EF 40% in 2007. b. 51% by nuc 05/2011.  [name] Kidney stones   . Lower limb amputation, below knee   . Myocardial infarction ([place]) 2005  . Neuromuscular disorder ([place])    diabetic  neuropathy  . NSTEMI (non-ST elevated myocardial infarction) ([place]) 04/2016  . PAD (peripheral artery disease) ([place])    a. R CEA 2007. b. Hx L BKA in 2013. c. s/p LE angioplasty in 2014. d. Hx R BKA in 2014.  [name] Peripheral neuropathy   . Prostate cancer ([place]) 2010  . Secondary hyperparathyroidism ([place])    Secondary Hyperpara- Thyroidism, Renal  . Type II diabetes mellitus ([place])     Past Surgical History:  Procedure Laterality Date  . ABDOMINAL AORTAGRAM N/A 05/28/2011   Procedure: ABDOMINAL Maxcine Ham;  Surgeon: Elam Dutch, MD;  Location: [facility] CATH LAB;  Service: Cardiovascular;  Laterality: N/A;  . ABDOMINAL AORTAGRAM N/A 06/02/2012   Procedure: ABDOMINAL Maxcine Ham;  Surgeon: Elam Dutch, MD;  Location: [facility] CATH LAB;  Service: Cardiovascular;  Laterality: N/A;  . ABDOMINAL AORTAGRAM N/A 06/09/2012   Procedure: ABDOMINAL Maxcine Ham;  Surgeon: Elam Dutch, MD;  Location: [facility] CATH LAB;  Service: Cardiovascular;  Laterality: N/A;  . AMPUTATION  06/14/2011   Procedure: AMPUTATION DIGIT;  Surgeon: Elam Dutch, MD;  Location: [facility] OR;  Service: Vascular;  Laterality: Left;  Amputation Left fifth toe  . AMPUTATION  06/16/2011   Procedure: AMPUTATION BELOW KNEE;  Surgeon: Elam Dutch, MD;  Location: [place];  Service: Vascular;  Laterality: Left;  . AMPUTATION Right 06/12/2012   Procedure: AMPUTATION DIGIT;  Surgeon: Elam Dutch, MD;  Location: [place];  Service: Vascular;  Laterality: Right;  GREAT TOE  . AMPUTATION Right 08/08/2012   Procedure: AMPUTATION BELOW KNEE;  Surgeon: Elam Dutch, MD;  Location: [place];  Service: Vascular;  Laterality: Right;  . AV FISTULA PLACEMENT Right 04/19/2014   Procedure: RIGHT ARM ARTERIOVENOUS (AV) FISTULA CREATION;  Surgeon: Mal Misty, MD;  Location: [place];  Service: Vascular;  Laterality: Right;  . AV FISTULA PLACEMENT Left 12/12/2014   Procedure: BRACHIOCEPHALIC ARTERIOVENOUS (AV) FISTULA CREATION;  Surgeon: Mal Misty, MD;  Location: [place];  Service: Vascular;  Laterality: Left;  . CARDIAC CATHETERIZATION  05/28/11  . CARDIAC CATHETERIZATION N/A 10/04/2014   Procedure: Left Heart Cath and Coronary Angiography;  Surgeon: Sherren Mocha, MD;  Location: [place] CV LAB;  Service: Cardiovascular;  Laterality: N/A;  . CAROTID ENDARTERECTOMY Right 2005  . CATARACT EXTRACTION W/ INTRAOCULAR LENS  IMPLANT, BILATERAL Bilateral 2011  . COLONOSCOPY    . COLONOSCOPY N/A 05/03/2015   Procedure: COLONOSCOPY;  Surgeon: Gatha Mayer, MD;  Location: [place];  Service: Endoscopy;  Laterality: N/A;  . CORONARY ARTERY BYPASS GRAFT  2005   CABG X4  . ENDARTERECTOMY Left 10/17/2014   Procedure: ENDARTERECTOMY CAROTID;  Surgeon: Mal Misty, MD;  Location: [place];  Service: Vascular;  Laterality: Left;  . I&D EXTREMITY  01/31/2012   Procedure: IRRIGATION AND DEBRIDEMENT EXTREMITY;  Surgeon: Elam Dutch, MD;  Location: [place];  Service: Vascular;  Laterality: Left;  I &  D Left BKA   . INSERTION OF DIALYSIS CATHETER Right 04/19/2014   Procedure: INSERTION OF DIALYSIS CATHETER-INTERNAL JUGULAR;  Surgeon: Mal Misty, MD;  Location: [place];  Service: Vascular;  Laterality: Right;  . INSERTION OF DIALYSIS CATHETER N/A 11/06/2014   Procedure: INSERTION OF 23cm DIALYSIS CATHETER - right internal jugular;  Surgeon: Mal Misty, MD;  Location: [place];  Service: Vascular;  Laterality: N/A;  . KNEE CARTILAGE SURGERY Left 1964  . LEFT HEART CATH AND CORONARY ANGIOGRAPHY N/A 04/12/2016   Procedure: Left Heart Cath and Coronary Angiography;  Surgeon: Nelva Bush, MD;  Location: [place] CV LAB;  Service: Cardiovascular;  Laterality: N/A;  . LIGATION OF ARTERIOVENOUS  FISTULA Right 11/06/2014   Procedure: LIGATION OF RADIOCEPHALIC ARTERIOVENOUS FISTULA;  Surgeon: Mal Misty, MD;  Location: [place];  Service: Vascular;  Laterality: Right;  . LIGATION OF COMPETING BRANCHES OF ARTERIOVENOUS FISTULA Right 06/19/2014   Procedure: Right Arm LIGATION  OF COMPETING BRANCHES OF RADIOCEPHALIC ARTERIOVENOUS FISTULA;  Surgeon: Mal Misty, MD;  Location: [place];  Service: Vascular;  Laterality: Right;  . PROSTATECTOMY  2009     Home Medications:  Prior to Admission medications   Medication Sig Start Date End Date Taking? Authorizing Provider  amLODipine (NORVASC) 5 MG tablet Take 1 tablet (5 mg total) by mouth daily. Take one tablet by mouth once daily 04/13/16  Yes Rai, Ripudeep K, MD  aspirin EC 81 MG tablet Take 81 mg by mouth daily.   Yes [provider]  AURYXIA 1 GM 210 MG(Fe) tablet Take 420 mg by mouth 3 (three) times daily. 03/18/16  Yes [provider]  calcium acetate (PHOSLO) 667 MG capsule Take 667 mg by mouth daily. 08/27/15  Yes [provider]  carvedilol (COREG) 6.25 MG tablet Take 1 tablet (6.25 mg total) by mouth 2 (two) times daily with a meal. 07/06/16  Yes [name], MD  hydrALAZINE (APRESOLINE) 25 MG tablet Take 1 tablet (25 mg total) by mouth every 8 (eight) hours. 04/13/16  Yes Rai, Ripudeep K, MD  isosorbide mononitrate (IMDUR) 30 MG 24 hr tablet Take 1 tablet (30 mg total) by mouth daily. 04/14/16  Yes Rai, Ripudeep K, MD  LEVEMIR FLEXTOUCH 100 UNIT/ML Pen Inject 13 Units as directed daily.  04/05/16  Yes [provider]  NOVOLOG FLEXPEN 100 UNIT/ML FlexPen CBG 70 - 120: 0 units  CBG 121 - 150: 1 unit  CBG 151 - 200: 2 units  CBG 201 - 250: 3 units  CBG 251 - 300: 5 units  CBG 301 - 350: 7 units  CBG 351 - 400: 9 units 04/06/16  Yes [provider]  polyethylene glycol (MIRALAX) packet Take 17 g by mouth daily. 04/05/16  Yes Estill Dooms, MD  pravastatin (PRAVACHOL) 40 MG tablet Take 40 mg by mouth daily.   Yes [provider]  pregabalin (LYRICA) 75 MG capsule Take 75 mg by mouth as needed.    Yes [provider]  QUEtiapine (SEROQUEL) 25 MG tablet Take 1 tablet (25 mg total) by mouth daily. Patient taking differently: Take 25 mg by mouth as needed.   04/05/16  Yes Estill Dooms, MD  warfarin (COUMADIN) 7.5 MG tablet Take 1 tablet (7.5 mg total) by mouth daily at 6 PM. Patient taking differently: Take 7.5 mg by mouth See admin instructions. 7.5 mg every evening, EXCEPT Sunday take 11.25 mg 07/06/16  Yes [name], MD    Inpatient Medications: Scheduled  Meds: . [START ON 07/01/2017] Chlorhexidine Gluconate Cloth  6 each Topical Q0600  . insulin aspart  0-9 Units Subcutaneous Q4H  . metoprolol tartrate  2.5 mg Intravenous Q8H   Continuous Infusions: . sodium chloride 10 mL/hr at 06/30/17 0324  . piperacillin-tazobactam (ZOSYN)  IV 3.375 g (06/30/17 1037)   PRN Meds: acetaminophen **OR** acetaminophen, hydrALAZINE, ondansetron **OR** ondansetron (ZOFRAN) IV  Allergies:    Allergies  Allergen Reactions  . Oxycodone Other (See Comments)    Hallucinations    Social History:   Social History   Socioeconomic History  . Marital status: Married    Spouse name: Not on file  . Number of children: 3  . Years of education: Not on file  . Highest education level: Not on file  Occupational History  . Occupation: Manufacturing systems engineer  Social Needs  . Financial resource strain: Not on file  . Food insecurity:    Worry: Not on file    Inability: Not on file  . Transportation needs:    Medical: Not on file    Non-medical: Not on file  Tobacco Use  . Smoking status: Former Smoker    Packs/day: 1.00    Years: 30.00    Pack years: 30.00    Types: Cigarettes    Last attempt to quit: 04/28/1991    Years since quitting: 26.1  . Smokeless tobacco: Never Used  Substance and Sexual Activity  . Alcohol use: Yes    Comment: 04/17/2014 "might have a glass of wine at Christmas"  . Drug use: No  . Sexual activity: Yes    Birth control/protection: None  Lifestyle  . Physical activity:    Days per week: Not on file    Minutes per session: Not on file  . Stress: Not on file  Relationships  . Social connections:    Talks on  phone: Not on file    Gets together: Not on file    Attends religious service: Not on file    Active member of club or organization: Not on file    Attends meetings of clubs or organizations: Not on file    Relationship status: Not on file  . Intimate partner violence:    Fear of current or ex partner: Not on file    Emotionally abused: Not on file    Physically abused: Not on file    Forced sexual activity: Not on file  Other Topics Concern  . Not on file  Social History Narrative  . Not on file    Family History:    Family History  Problem Relation Age of Onset  . Hyperlipidemia Mother   . Hypertension Mother   . Cancer Mother   . Hyperlipidemia Father   . Hypertension Father   . Kidney disease Father   . Heart disease Sister   . Alzheimer's disease Sister   . Cancer Brother   . Other Sister   . Coronary artery disease Neg Hx   . Anesthesia problems Neg Hx   . Hypotension Neg Hx   . Malignant hyperthermia Neg Hx   . Pseudochol deficiency Neg Hx      ROS:  Please see the history of present illness.  No chills. All other ROS reviewed and negative.     Physical Exam/Data:   Vitals:   06/30/17 0105 06/30/17 0410 06/30/17 0900 06/30/17 1000  BP: (!) 156/75 (!) 158/73  131/67  Pulse: 79 76  67  Resp: 17 16  Temp: 98.7 F (37.1 C) 98.8 F (37.1 C)  98.2 F (36.8 C)  TempSrc: Oral Oral  Oral  SpO2: 100% 92%  94%  Weight: 159 lb 13.3 oz (72.5 kg)     Height:   5\' 8"  (1.727 m)     Intake/Output Summary (Last 24 hours) at 06/30/2017 1327 Last data filed at 06/30/2017 0600 Gross per 24 hour  Intake 140 ml  Output 0 ml  Net 140 ml   Filed Weights   06/29/17 1939 06/30/17 0105  Weight: 170 lb (77.1 kg) 159 lb 13.3 oz (72.5 kg)   Body mass index is 24.3 kg/m.  General:  Well nourished chronically ill appearing AAM in no acute distress HEENT: normal Lymph: no adenopathy Neck: no JVD Endocrine:  No thryomegaly Vascular: + R CEA scar and carotid bruit. No  L carotid bruit.  Cardiac:  normal S1, S2; RRR; very soft SEM at RUSB otherwise non acute Lungs:  clear to auscultation bilaterally, no wheezing, rhonchi or rales  Abd: soft, nontender, no hepatomegaly  Ext: no edema Musculoskeletal: s/p bilateral LE amputations Skin: warm and dry  Neuro:  CNs 2-12 intact, no focal abnormalities noted Psych:  Normal affect   EKG:  The EKG was personally reviewed and demonstrates: probable sinus tachycardia 103bpm with one PVC, artifact present, subtle ST depression inferiorly and V5-V6 with TW abnormality in these leads as well as I and avL. NSIVCD. No acute change from prior.   Telemetry:  Telemetry was personally reviewed and demonstrates NSR  Relevant CV Studies: 2D echo 04/11/16 Study Conclusions - Left ventricle: Septal and posterior lateral wall hypokinesis The cavity size was mildly dilated. There was mild focal basal hypertrophy of the septum. Systolic function was moderately reduced. The estimated ejection fraction was in the range of 35% to 40%. Doppler parameters are consistent with abnormal left ventricular relaxation (grade 1 diastolic dysfunction). - Aortic valve: Likely trileaflet but not well seen - Atrial septum: No defect or patent foramen ovale was identified.  Laboratory Data:  Chemistry Recent Labs  Lab 06/29/17 2052 06/30/17 0428  NA 138 140  K 5.1 4.2  CL 94* 97*  CO2 26 26  GLUCOSE 175* 161*  BUN 25* 28*  CREATININE 5.94* 6.33*  CALCIUM 8.9 8.5*  GFRNONAA 8* 8*  GFRAA 9* 9*  ANIONGAP 18* 17*    Recent Labs  Lab 06/29/17 2052 06/30/17 0428  PROT 7.4 6.7  ALBUMIN 4.0 3.6  AST 78* 64*  ALT 97* 96*  ALKPHOS 120 103  BILITOT 1.7* 0.8   Hematology Recent Labs  Lab 06/29/17 2052 06/30/17 0428  WBC 7.3 7.3  RBC 3.73* 3.53*  HGB 11.6* 11.1*  HCT 38.2* 35.7*  MCV 102.4* 101.1*  MCH 31.1 31.4  MCHC 30.4 31.1  RDW 15.7* 15.8*  PLT 168 145*    Radiology/Studies:  Dg Chest Port 1 View  Result Date:  06/30/2017 CLINICAL DATA:  Abdominal pain EXAM: PORTABLE CHEST 1 VIEW COMPARISON:  06/28/2016 FINDINGS: Postoperative changes in the mediastinum. Mild cardiac enlargement. Interstitial pattern to the lungs likely representing pulmonary edema. No pleural effusions. No pneumothorax. No focal consolidation. Calcification of the aorta. Degenerative changes in the spine and shoulders. Surgical clips in the base of the neck. Vascular graft over the left axillary region. IMPRESSION: Cardiac enlargement with interstitial edema similar to previous study. Aortic atherosclerosis. Electronically Signed   By: Lucienne Capers M.D.   On: 06/30/2017 03:54   US Abdomen Limited Ruq  Result Date: 06/29/2017 CLINICAL  DATA:  78 year old male with upper abdominal pain, nausea and vomiting onset today. Dialysis patient. EXAM: ULTRASOUND ABDOMEN LIMITED RIGHT UPPER QUADRANT COMPARISON:  CT Abdomen and Pelvis 02/17/2001 FINDINGS: Gallbladder: Positive small gallstones, individually estimated at 5 millimeters (image 12). Gallbladder wall thickening is at the upper limits of normal mildly increased, 3 millimeters. No pericholecystic fluid. Equivocal sonographic Murphy sign. Common bile duct: Diameter: 4 millimeters, normal. Liver: Liver echogenicity is within normal limits. No discrete liver lesion identified. No definite intrahepatic biliary ductal dilatation, but questionable pneumobilia (images 37 and 38). Portal vein is patent on color Doppler imaging with normal direction of blood flow towards the liver. Other findings: Right pleural effusion is evident. Negative visible right kidney. IMPRESSION: 1. Cholelithiasis with equivocal gallbladder wall thickening and sonographic Murphy sign. Consider mild or early acute cholecystitis. 2. No evidence of biliary ductal obstruction. Questionable Pneumobilia, which can be a normal finding in patients who have undergone prior ERCP/sphincterotomy. 3. Right pleural effusion. Electronically Signed    By: Genevie Ann M.D.   On: 06/29/2017 23:25    Assessment and Plan:   1. Pre-operative clearance for cholecystitis - revised cardiac risk index is calculated at >11% indicating moderate-high risk of adverse outcome with noncardiac surgery but I am not certain there is any specific cardiology intervention in prep for this to alleviate this risk. He has chronic unchanged dyspnea. He has not had any recent angina. Will review pre-operative recs with MD. 2. CAD s/p CABG 2007 - ASA on hold in prep for procedure. Other home meds held due to NPO status, but remains stable on IV beta blocker q8hours. 3. Chronic combined CHF - known issue. May be worthwhile once taking orals to optimize regimen for LV dysfunction (i.e. Consider titration of hydralazine in lieu of amlodipine). Not ideal candidate for ACEI/ARB/spiro given his ESRD and risk of hyperkalemia. Volume is managed by HD. 4. Paroxysmal atrial fibrillation - CHADSVASC 6. No prior CVA. INR followed by family medicine - recently supratherapeutic earlier this month but coming in subtherapeutic. Coumadin on hold for possible procedure. Will review with MD regarding whether IV heparin is needed in the interim. No known hx of TIA/CVA that I can find. Continue IV Lopressor as you are doing and would continue perioperatively. It would not be unusual to see recurrent atrial fib during acute illness so would monitor closely on telemetry. 5. ESRD - on HD MWF, per nephrology.  For questions or updates, please contact [place] Please consult [email or web address] for contact info under Cardiology/STEMI.   Signed, Charlie Pitter, PA-C  06/30/2017 1:27 PM  I have seen and examined the patient along with Charlie Pitter, PA-C .  I have reviewed the chart, notes and new data.  I agree with PA's note.  Key new complaints: no angina, no dyspnea at rest, no other recent change in chronic CV complaints (exertional dyspnea NYHA class 3) Key examination changes: s/p bilateral BKA,  no rales, no JVD, no S3, early peaking systolic murmur Key new findings / data: HIDA w good gallbladder EF, ECG w SR and nonspecific IVCD, cath 2018 reviewed. INR 1.35.  PLAN: Moderate to high risk for major CV complications with abdominal surgery. If he develops true acute cholecystitis, the risk is not prohibitive and surgery should be undertaken. Elctive surgeries should be deferred in my opinion. As you are doing, it is very important to continue uninterrupted beta blockers (IV if necessary) to avoid rebound hyperadrenergic complications. This is particularly critical in the perioperative phase.  Otherwise, I do not think additional cardiac evaluation or any change in therapy would ameliorate his surgical risk.  Sanda Klein, MD, [place] [phone] 06/30/2017, 6:03 PM

## 2017-06-30 NOTE — Progress Notes (Signed)
Patient ID: Aaron Long, male   DOB: [birth year], 77 y.o.   MRN: [id number]       Subjective: Pt hungry today.  No further abdominal pain, N/V this am.  Last pain meds was last night per patient, only 53mcg of fentanyl at 2200.  Objective: Vital signs in last 24 hours: Temp:  [98.7 F (37.1 C)-99 F (37.2 C)] 98.8 F (37.1 C) (06/20 0410) Pulse Rate:  [76-104] 76 (06/20 0410) Resp:  [16-20] 16 (06/20 0410) BP: (156-178)/(73-92) 158/73 (06/20 0410) SpO2:  [92 %-100 %] 92 % (06/20 0410) Weight:  [72.5 kg (159 lb 13.3 oz)-77.1 kg (170 lb)] 72.5 kg (159 lb 13.3 oz) (06/20 0105) Last BM Date: 06/27/17  Intake/Output from previous day: 06/19 0701 - 06/20 0700 In: 140 [I.V.:40; IV Piggyback:100] Out: 0  Intake/Output this shift: No intake/output data recorded.  PE: Heart: regular Lungs: CTAB Abd: soft, NT, ND, +BS  Lab Results:  Recent Labs    06/29/17 2052 06/30/17 0428  WBC 7.3 7.3  HGB 11.6* 11.1*  HCT 38.2* 35.7*  PLT 168 145*   BMET Recent Labs    06/29/17 2052 06/30/17 0428  NA 138 140  K 5.1 4.2  CL 94* 97*  CO2 26 26  GLUCOSE 175* 161*  BUN 25* 28*  CREATININE 5.94* 6.33*  CALCIUM 8.9 8.5*   PT/INR Recent Labs    06/30/17 0042  LABPROT 16.5*  INR 1.35   CMP     Component Value Date/Time   NA 140 06/30/2017 0428   NA 134 04/10/2015 1535   K 4.2 06/30/2017 0428   CL 97 (L) 06/30/2017 0428   CO2 26 06/30/2017 0428   GLUCOSE 161 (H) 06/30/2017 0428   BUN 28 (H) 06/30/2017 0428   BUN 26 04/10/2015 1535   CREATININE 6.33 (H) 06/30/2017 0428   CALCIUM 8.5 (L) 06/30/2017 0428   PROT 6.7 06/30/2017 0428   PROT 7.0 04/10/2015 1535   ALBUMIN 3.6 06/30/2017 0428   ALBUMIN 4.2 04/10/2015 1535   AST 64 (H) 06/30/2017 0428   ALT 96 (H) 06/30/2017 0428   ALKPHOS 103 06/30/2017 0428   BILITOT 0.8 06/30/2017 0428   BILITOT 0.3 04/10/2015 1535   GFRNONAA 8 (L) 06/30/2017 0428   GFRAA 9 (L) 06/30/2017 0428   Lipase     Component Value  Date/Time   LIPASE 47 06/29/2017 2052       Studies/Results: Dg Chest Port 1 View  Result Date: 06/30/2017 CLINICAL DATA:  Abdominal pain EXAM: PORTABLE CHEST 1 VIEW COMPARISON:  06/28/2016 FINDINGS: Postoperative changes in the mediastinum. Mild cardiac enlargement. Interstitial pattern to the lungs likely representing pulmonary edema. No pleural effusions. No pneumothorax. No focal consolidation. Calcification of the aorta. Degenerative changes in the spine and shoulders. Surgical clips in the base of the neck. Vascular graft over the left axillary region. IMPRESSION: Cardiac enlargement with interstitial edema similar to previous study. Aortic atherosclerosis. Electronically Signed   By: Lucienne Capers M.D.   On: 06/30/2017 03:54   US Abdomen Limited Ruq  Result Date: 06/29/2017 CLINICAL DATA:  77 year old male with upper abdominal pain, nausea and vomiting onset today. Dialysis patient. EXAM: ULTRASOUND ABDOMEN LIMITED RIGHT UPPER QUADRANT COMPARISON:  CT Abdomen and Pelvis 02/17/2001 FINDINGS: Gallbladder: Positive small gallstones, individually estimated at 5 millimeters (image 12). Gallbladder wall thickening is at the upper limits of normal mildly increased, 3 millimeters. No pericholecystic fluid. Equivocal sonographic Murphy sign. Common bile duct: Diameter: 4 millimeters, normal. Liver: Liver echogenicity is  within normal limits. No discrete liver lesion identified. No definite intrahepatic biliary ductal dilatation, but questionable pneumobilia (images 37 and 38). Portal vein is patent on color Doppler imaging with normal direction of blood flow towards the liver. Other findings: Right pleural effusion is evident. Negative visible right kidney. IMPRESSION: 1. Cholelithiasis with equivocal gallbladder wall thickening and sonographic Murphy sign. Consider mild or early acute cholecystitis. 2. No evidence of biliary ductal obstruction. Questionable Pneumobilia, which can be a normal finding  in patients who have undergone prior ERCP/sphincterotomy. 3. Right pleural effusion. Electronically Signed   By: Genevie Ann M.D.   On: 06/29/2017 23:25    Anti-infectives: Anti-infectives (From admission, onward)   Start     Dose/Rate Route Frequency Ordered Stop   06/30/17 1000  piperacillin-tazobactam (ZOSYN) IVPB 3.375 g     3.375 g 12.5 mL/hr over 240 Minutes Intravenous Every 12 hours 06/30/17 0237     06/30/17 0000  piperacillin-tazobactam (ZOSYN) IVPB 3.375 g     3.375 g 100 mL/hr over 30 Minutes Intravenous  Once 06/29/17 2358 06/30/17 0401       Assessment/Plan Cholelithiasis, maybe early cholecystitis -patient feels well this morning and is hungry with no abdominal pain.  Will order a HIDA scan to determine if he truly has cholecystitis and needs further intervention.   -he will need cardiology evaluation for perioperative clearance.  If his HIDA is positive, then we will have to discuss risks vs benefits of surgery vs abx therapy vs drain placement.  If his HIDA is negative, then will still need to determine if he will need his GB out at some point secondary to biliary colic. -keep NPO for HIDA.  Once completed can have clears   DM PAD CAD, h/o MI Ischemic cardiomyopathy CHF ESRD on HD, MWF  FEN - NPO for HIDA VTE - SCDs/coumadin only hold, INR 1.35.  Could start heparin if warranted ID - [email or web address]   LOS: 0 days    Henreitta Cea , [facility] 06/30/2017, 9:24 AM Pager: [phone]

## 2017-06-30 NOTE — Consult Note (Addendum)
[facility] Renal Consultation Note    Indication for Consultation:  Management of ESRD/hemodialysis, anemia, hypertension/volume, and secondary hyperparathyroidism. PCP:  HPI: Aaron Long is a 77 y.o. male with ESRD, Type 2 DM, HTN, PVD (s/p B BKAs), Hx prostate cancer, CAD (s/p CABG), and Hx DVT and atrial fibrillation (on warfarin) who was admitted with abdominal pain, ?possible cholecystitis.  Developed generalized abdominal pain and vomiting on 06/28/17. No fever or chills. No diarrhea. Presented to ED on 6/20 for evaluation. Labs showed Na 138, K 5.1, WBC 7.3, Hgb 11.6. CXR showed pulm edema, KUB with constipation. Abd u/s showed cholelithiasis with possible cholecytitis. Admitted for eval, started on IV Zosyn. Surgery consulted, plan for HIDA scan to determine if needs cholecystectomy. At this time, abd pain improving. No CP or dyspnea. No N/V/D.   Strongly suspect cholecystitis although ischemic bowel always a possibility with embolic from atrial fibrillation or general atherosclerosis   Dialyzes MWF at [facility]. Last HD was 06/29/17 which he completed in entirety, has been meeting EDW. Uses AVF without recent issues.  Past Medical History:  Diagnosis Date  . Abnormality of gait   . Anemia   . BPH (benign prostatic hypertrophy)   . CHF (congestive heart failure) ([place])   . Complication of anesthesia    "he gets delirious"  . Constipation    takes Miralax daily  . Coronary artery disease    a. s/p NSTEMI/CABGx4 in 2007 - LIMA-LAD, SVG-optional diagonal, SVVG-OM, SVG-dRCA. b. Nuc 05/2011: nonischemic.  . Diabetic peripheral neuropathy ([place])   . Dieulafoy lesion of rectum 05/03/2015  . Disorder of bone and cartilage, unspecified   . DVT (deep venous thrombosis) ([place])    a. Lower extremity DVT in 2013.  [name] Dyspnea   . ESRD on dialysis [facility])    M-W-F  . Gangrene of toe (H[facility])    dry  . Hemorrhoids   . Hx of colonic polyps   . Hyperlipidemia   .  Hypertension   . Hypertrophy of prostate without urinary obstruction and other lower urinary tract symptoms (LUTS)   . Ischemic cardiomyopathy    a. EF 40% in 2007. b. 51% by nuc 05/2011.  [name] Kidney stones   . Lower limb amputation, below knee   . Myocardial infarction ([place]) 2005  . Neuromuscular disorder ([employer])    diabetic neuropathy  . NSTEMI (non-ST elevated myocardial infarction) ([place]) 04/2016  . PAD (peripheral artery disease) ([place])    a. R CEA 2007. b. Hx L BKA in 2013. c. s/p LE angioplasty in 2014. d. Hx R BKA in 2014.  [name] Peripheral neuropathy   . Prostate cancer ([place]) 2010  . Secondary hyperparathyroidism ([place])    Secondary Hyperpara- Thyroidism, Renal  . Type II diabetes mellitus ([place])    Past Surgical History:  Procedure Laterality Date  . ABDOMINAL AORTAGRAM N/A 05/28/2011   Procedure: ABDOMINAL Maxcine Ham;  Surgeon: Elam Dutch, MD;  Location: [facility] CATH LAB;  Service: Cardiovascular;  Laterality: N/A;  . ABDOMINAL AORTAGRAM N/A 06/02/2012   Procedure: ABDOMINAL Maxcine Ham;  Surgeon: Elam Dutch, MD;  Location: [facility] CATH LAB;  Service: Cardiovascular;  Laterality: N/A;  . ABDOMINAL AORTAGRAM N/A 06/09/2012   Procedure: ABDOMINAL Maxcine Ham;  Surgeon: Elam Dutch, MD;  Location: [facility] CATH LAB;  Service: Cardiovascular;  Laterality: N/A;  . AMPUTATION  06/14/2011   Procedure: AMPUTATION DIGIT;  Surgeon: Elam Dutch, MD;  Location: [facility] OR;  Service: Vascular;  Laterality: Left;  Amputation Left  fifth toe  . AMPUTATION  06/16/2011   Procedure: AMPUTATION BELOW KNEE;  Surgeon: Elam Dutch, MD;  Location: [place];  Service: Vascular;  Laterality: Left;  . AMPUTATION Right 06/12/2012   Procedure: AMPUTATION DIGIT;  Surgeon: Elam Dutch, MD;  Location: [place];  Service: Vascular;  Laterality: Right;  GREAT TOE  . AMPUTATION Right 08/08/2012   Procedure: AMPUTATION BELOW KNEE;  Surgeon: Elam Dutch, MD;  Location: [place];  Service: Vascular;  Laterality: Right;  . AV  FISTULA PLACEMENT Right 04/19/2014   Procedure: RIGHT ARM ARTERIOVENOUS (AV) FISTULA CREATION;  Surgeon: Mal Misty, MD;  Location: [place];  Service: Vascular;  Laterality: Right;  . AV FISTULA PLACEMENT Left 12/12/2014   Procedure: BRACHIOCEPHALIC ARTERIOVENOUS (AV) FISTULA CREATION;  Surgeon: Mal Misty, MD;  Location: [place];  Service: Vascular;  Laterality: Left;  . CARDIAC CATHETERIZATION  05/28/11  . CARDIAC CATHETERIZATION N/A 10/04/2014   Procedure: Left Heart Cath and Coronary Angiography;  Surgeon: Sherren Mocha, MD;  Location: [place] CV LAB;  Service: Cardiovascular;  Laterality: N/A;  . CAROTID ENDARTERECTOMY Right 2005  . CATARACT EXTRACTION W/ INTRAOCULAR LENS  IMPLANT, BILATERAL Bilateral 2011  . COLONOSCOPY    . COLONOSCOPY N/A 05/03/2015   Procedure: COLONOSCOPY;  Surgeon: Gatha Mayer, MD;  Location: [place];  Service: Endoscopy;  Laterality: N/A;  . CORONARY ARTERY BYPASS GRAFT  2005   CABG X4  . ENDARTERECTOMY Left 10/17/2014   Procedure: ENDARTERECTOMY CAROTID;  Surgeon: Mal Misty, MD;  Location: [place];  Service: Vascular;  Laterality: Left;  . I&D EXTREMITY  01/31/2012   Procedure: IRRIGATION AND DEBRIDEMENT EXTREMITY;  Surgeon: Elam Dutch, MD;  Location: [place];  Service: Vascular;  Laterality: Left;  I & D Left BKA   . INSERTION OF DIALYSIS CATHETER Right 04/19/2014   Procedure: INSERTION OF DIALYSIS CATHETER-INTERNAL JUGULAR;  Surgeon: Mal Misty, MD;  Location: [place];  Service: Vascular;  Laterality: Right;  . INSERTION OF DIALYSIS CATHETER N/A 11/06/2014   Procedure: INSERTION OF 23cm DIALYSIS CATHETER - right internal jugular;  Surgeon: Mal Misty, MD;  Location: [place];  Service: Vascular;  Laterality: N/A;  . KNEE CARTILAGE SURGERY Left 1964  . LEFT HEART CATH AND CORONARY ANGIOGRAPHY N/A 04/12/2016   Procedure: Left Heart Cath and Coronary Angiography;  Surgeon: Nelva Bush, MD;  Location: [place] CV LAB;  Service: Cardiovascular;   Laterality: N/A;  . LIGATION OF ARTERIOVENOUS  FISTULA Right 11/06/2014   Procedure: LIGATION OF RADIOCEPHALIC ARTERIOVENOUS FISTULA;  Surgeon: Mal Misty, MD;  Location: [place];  Service: Vascular;  Laterality: Right;  . LIGATION OF COMPETING BRANCHES OF ARTERIOVENOUS FISTULA Right 06/19/2014   Procedure: Right Arm LIGATION OF COMPETING BRANCHES OF RADIOCEPHALIC ARTERIOVENOUS FISTULA;  Surgeon: Mal Misty, MD;  Location: [place];  Service: Vascular;  Laterality: Right;  . PROSTATECTOMY  2009   Family History  Problem Relation Age of Onset  . Hyperlipidemia Mother   . Hypertension Mother   . Cancer Mother   . Hyperlipidemia Father   . Hypertension Father   . Kidney disease Father   . Heart disease Sister   . Alzheimer's disease Sister   . Cancer Brother   . Other Sister   . Coronary artery disease Neg Hx   . Anesthesia problems Neg Hx   . Hypotension Neg Hx   . Malignant hyperthermia Neg Hx   . Pseudochol deficiency Neg Hx    Social  History:  reports that he quit smoking about 26 years ago. His smoking use included cigarettes. He has a 30.00 pack-year smoking history. He has never used smokeless tobacco. He reports that he drinks alcohol. He reports that he does not use drugs.  ROS: As per HPI otherwise negative.  Physical Exam: Vitals:   06/30/17 0105 06/30/17 0410 06/30/17 0900 06/30/17 1000  BP: (!) 156/75 (!) 158/73  131/67  Pulse: 79 76  67  Resp: 17 16    Temp: 98.7 F (37.1 C) 98.8 F (37.1 C)  98.2 F (36.8 C)  TempSrc: Oral Oral  Oral  SpO2: 100% 92%  94%  Weight: 72.5 kg (159 lb 13.3 oz)     Height:   5\' 8"  (1.727 m)      General: Well developed, well nourished, in no acute distress. Head: Normocephalic, atraumatic, sclera non-icteric, mucus membranes are moist. Neck: Supple without lymphadenopathy/masses. JVD not elevated. Lungs: Clear bilaterally to auscultation without wheezes, rales, or rhonchi.  Heart: RRR with normal S1, S2. No murmurs, rubs, or  gallops appreciated. Abdomen: Soft, mild tenderness across entire upper abdomen. No guarding or rebound tenderness. Musculoskeletal:  Strength and tone appear normal for age. Lower extremities: B BKA, no stump edema. Neuro: Alert and oriented X 3. Moves all extremities spontaneously. Psych:  Responds to questions appropriately with a normal affect. Dialysis Access: AVF + thrill  Allergies  Allergen Reactions  . Oxycodone Other (See Comments)    Hallucinations   Prior to Admission medications   Medication Sig Start Date End Date Taking? Authorizing Provider  amLODipine (NORVASC) 5 MG tablet Take 1 tablet (5 mg total) by mouth daily. Take one tablet by mouth once daily 04/13/16  Yes Rai, Ripudeep K, MD  aspirin EC 81 MG tablet Take 81 mg by mouth daily.   Yes [provider]  AURYXIA 1 GM 210 MG(Fe) tablet Take 420 mg by mouth 3 (three) times daily. 03/18/16  Yes [provider]  calcium acetate (PHOSLO) 667 MG capsule Take 667 mg by mouth daily. 08/27/15  Yes [provider]  carvedilol (COREG) 6.25 MG tablet Take 1 tablet (6.25 mg total) by mouth 2 (two) times daily with a meal. 07/06/16  Yes [name], MD  hydrALAZINE (APRESOLINE) 25 MG tablet Take 1 tablet (25 mg total) by mouth every 8 (eight) hours. 04/13/16  Yes Rai, Ripudeep K, MD  isosorbide mononitrate (IMDUR) 30 MG 24 hr tablet Take 1 tablet (30 mg total) by mouth daily. 04/14/16  Yes Rai, Ripudeep K, MD  LEVEMIR FLEXTOUCH 100 UNIT/ML Pen Inject 13 Units as directed daily.  04/05/16  Yes [provider]  NOVOLOG FLEXPEN 100 UNIT/ML FlexPen CBG 70 - 120: 0 units  CBG 121 - 150: 1 unit  CBG 151 - 200: 2 units  CBG 201 - 250: 3 units  CBG 251 - 300: 5 units  CBG 301 - 350: 7 units  CBG 351 - 400: 9 units 04/06/16  Yes [provider]  polyethylene glycol (MIRALAX) packet Take 17 g by mouth daily. 04/05/16  Yes Estill Dooms, MD  pravastatin (PRAVACHOL) 40 MG tablet Take 40 mg by mouth daily.    Yes [provider]  pregabalin (LYRICA) 75 MG capsule Take 75 mg by mouth as needed.    Yes [provider]  QUEtiapine (SEROQUEL) 25 MG tablet Take 1 tablet (25 mg total) by mouth daily. Patient taking differently: Take 25 mg by mouth as needed.  04/05/16  Yes  Estill Dooms, MD  warfarin (COUMADIN) 7.5 MG tablet Take 1 tablet (7.5 mg total) by mouth daily at 6 PM. Patient taking differently: Take 7.5 mg by mouth See admin instructions. 7.5 mg every evening, EXCEPT Sunday take 11.25 mg 07/06/16  Yes [name], MD   Current Facility-Administered Medications  Medication Dose Route Frequency Provider Last Rate Last Dose  . 0.9 %  sodium chloride infusion   Intravenous Continuous Rise Patience, MD 10 mL/hr at 06/30/17 0324    . acetaminophen (TYLENOL) tablet 650 mg  650 mg Oral Q6H PRN Rise Patience, MD       Or  . acetaminophen (TYLENOL) suppository 650 mg  650 mg Rectal Q6H PRN Rise Patience, MD      . hydrALAZINE (APRESOLINE) injection 10 mg  10 mg Intravenous Q4H PRN Rise Patience, MD      . insulin aspart (novoLOG) injection 0-9 Units  0-9 Units Subcutaneous Q4H Rise Patience, MD   1 Units at 06/30/17 [phone]  . metoprolol tartrate (LOPRESSOR) injection 2.5 mg  2.5 mg Intravenous Q8H Rise Patience, MD   2.5 mg at 06/30/17 0651  . ondansetron (ZOFRAN) tablet 4 mg  4 mg Oral Q6H PRN Rise Patience, MD       Or  . ondansetron [facility]) injection 4 mg  4 mg Intravenous Q6H PRN Rise Patience, MD      . piperacillin-tazobactam (ZOSYN) IVPB 3.375 g  3.375 g Intravenous Q12H Erenest Blank, RPH 12.5 mL/hr at 06/30/17 1037 3.375 g at 06/30/17 1037   Labs: Basic Metabolic Panel: Recent Labs  Lab 06/29/17 2052 06/30/17 0428  NA 138 140  K 5.1 4.2  CL 94* 97*  CO2 26 26  GLUCOSE 175* 161*  BUN 25* 28*  CREATININE 5.94* 6.33*  CALCIUM 8.9 8.5*   Liver Function Tests: Recent Labs  Lab 06/29/17 2052 06/30/17 0428   AST 78* 64*  ALT 97* 96*  ALKPHOS 120 103  BILITOT 1.7* 0.8  PROT 7.4 6.7  ALBUMIN 4.0 3.6   Recent Labs  Lab 06/29/17 2052  LIPASE 47   CBC: Recent Labs  Lab 06/29/17 2052 06/30/17 0428  WBC 7.3 7.3  NEUTROABS  --  5.0  HGB 11.6* 11.1*  HCT 38.2* 35.7*  MCV 102.4* 101.1*  PLT 168 145*   Studies/Results: Dg Chest Port 1 View  Result Date: 06/30/2017 CLINICAL DATA:  Abdominal pain EXAM: PORTABLE CHEST 1 VIEW COMPARISON:  06/28/2016 FINDINGS: Postoperative changes in the mediastinum. Mild cardiac enlargement. Interstitial pattern to the lungs likely representing pulmonary edema. No pleural effusions. No pneumothorax. No focal consolidation. Calcification of the aorta. Degenerative changes in the spine and shoulders. Surgical clips in the base of the neck. Vascular graft over the left axillary region. IMPRESSION: Cardiac enlargement with interstitial edema similar to previous study. Aortic atherosclerosis. Electronically Signed   By: Lucienne Capers M.D.   On: 06/30/2017 03:54   US Abdomen Limited Ruq  Result Date: 06/29/2017 CLINICAL DATA:  77 year old male with upper abdominal pain, nausea and vomiting onset today. Dialysis patient. EXAM: ULTRASOUND ABDOMEN LIMITED RIGHT UPPER QUADRANT COMPARISON:  CT Abdomen and Pelvis 02/17/2001 FINDINGS: Gallbladder: Positive small gallstones, individually estimated at 5 millimeters (image 12). Gallbladder wall thickening is at the upper limits of normal mildly increased, 3 millimeters. No pericholecystic fluid. Equivocal sonographic Murphy sign. Common bile duct: Diameter: 4 millimeters, normal. Liver: Liver echogenicity is within normal limits. No discrete liver lesion identified. No definite  intrahepatic biliary ductal dilatation, but questionable pneumobilia (images 37 and 38). Portal vein is patent on color Doppler imaging with normal direction of blood flow towards the liver. Other findings: Right pleural effusion is evident. Negative  visible right kidney. IMPRESSION: 1. Cholelithiasis with equivocal gallbladder wall thickening and sonographic Murphy sign. Consider mild or early acute cholecystitis. 2. No evidence of biliary ductal obstruction. Questionable Pneumobilia, which can be a normal finding in patients who have undergone prior ERCP/sphincterotomy. 3. Right pleural effusion. Electronically Signed   By: Genevie Ann M.D.   On: 06/29/2017 23:25    Dialysis Orders:  MWF at [facility] 4hr, 450/800, EDW 73kg, 2K/2.25Ca, UF #2, AVF, no heparin - Sensipar 120mg  PO q HD - Hectoral 63mcg IV q HD - Mircera 32mcg IV q 4 weeks (last 6/14)  Assessment/Plan: 1.  ?Cholecystitis/+ cholelithiasis: HIDA scan ordered by surgery, await results/plan. Agree with HIDA 2.  ESRD: Usual MWF schedule, will plan for next HD 6/21. No heparin. 3.  Hypertension/volume: BP reasonable, no edema on exam but chest imaging with + pulm edema. He is comfortable laying supine without oxygen supplementation. Will hold off on HD until tomorrow, but try to challenge EDW down.no urgent need for dialysis today 4.  Anemia: Hgb 11.1 No ESA for now. 5.  Metabolic bone disease: Ca ok. He is NPO for now, resume meds once allowed. 6.  Type 2 DM 7. CAD (s/p CABG) 8. PVD (s/p B BKAs) 9. A-fib (on home warfarin): anticoagulation on hold for now.  I have seen and examined this patient and agree with the plan of care    [facility] 06/30/2017, 3:17 PM   Veneta Penton, PA-C 06/30/2017, 12:12 PM  [facility] Pager: [phone]

## 2017-06-30 NOTE — ED Notes (Signed)
ED Provider at bedside. 

## 2017-06-30 NOTE — Progress Notes (Signed)
Pharmacy Antibiotic Note  Aaron Long is a 77 y.o. male admitted on 06/29/2017 with intra-abdominal infection.  Pharmacy has been consulted for Zosyn dosing. WBC WNL. ESRD on HD MWF.   Plan: Zosyn 3.375G IV q12h to be infused over 4 hours Trend WBC, temp F/U infectious work-up  Weight: 159 lb 13.3 oz (72.5 kg)  Temp (24hrs), Avg:98.9 F (37.2 C), Min:98.7 F (37.1 C), Max:99 F (37.2 C)  Recent Labs  Lab 06/29/17 2052  WBC 7.3  CREATININE 5.94*    CrCl cannot be calculated (Unknown ideal weight.).    Allergies  Allergen Reactions  . Oxycodone Other (See Comments)    Hallucinations     Narda Bonds 06/30/2017 2:34 AM

## 2017-06-30 NOTE — Progress Notes (Signed)
Aaron Long is a 77 y.o. male with history of CAD status post CABG, atrial fibrillation, hypertension and ESRD on hemodialysis, diabetes mellitus presents to the ER with complaints of abdominal pain. In the ER sonogram of the abdomen shows features concerning for gallstones with possible cholecystitis.   Plan: 1. HIDA scan.  2. Surgery consult 3. Nephrology consult for HD in am.  4. Clear liquid diet    [name], MD [phone]

## 2017-06-30 NOTE — H&P (Signed)
History and Physical    MERCY Long UUV:[id number] DOB: 05-Dec-1940 DOA: 06/29/2017  PCP: Bartholome Bill, MD  Patient coming from: Home.  Chief Complaint: Abdominal pain.  HPI: Aaron Long is a 77 y.o. male with history of CAD status post CABG, atrial fibrillation, hypertension and ESRD on hemodialysis, diabetes mellitus presents to the ER with complaints of abdominal pain.  Patient has been abdominal pain last 2 days.  Each time he ate he started throwing up.  Abdominal pain is mostly in the upper quadrants.  Denies any chest pain shortness of breath fever chills productive cough.  Denies any diarrhea.  ED Course: In the ER sonogram of the abdomen shows features concerning for gallstones with possible cholecystitis.  There also was pneumobilia.  On-call general surgeon was consulted patient is admitted to hospital service for further management.  Labs show mildly elevated LFTs.  INR 1.35.  Review of Systems: As per HPI, rest all negative.   Past Medical History:  Diagnosis Date  . Abnormality of gait   . Anemia   . BPH (benign prostatic hypertrophy)   . CHF (congestive heart failure) ([place])   . Complication of anesthesia    "he gets delirious"  . Constipation    takes Miralax daily  . Coronary artery disease    a. s/p NSTEMI/CABGx4 in 2007 - LIMA-LAD, SVG-optional diagonal, SVVG-OM, SVG-dRCA. b. Nuc 05/2011: nonischemic.  . Diabetic peripheral neuropathy ([place])   . Dieulafoy lesion of rectum 05/03/2015  . Disorder of bone and cartilage, unspecified   . DVT (deep venous thrombosis) ([place])    a. Lower extremity DVT in 2013.  [name] Dyspnea   . ESRD on dialysis [facility])    M-W-F  . Gangrene of toe (HCC)    dry  . Hemorrhoids   . Hx of colonic polyps   . Hyperlipidemia   . Hypertension   . Hypertrophy of prostate without urinary obstruction and other lower urinary tract symptoms (LUTS)   . Ischemic cardiomyopathy    a. EF 40% in 2007. b. 51% by nuc 05/2011.  [name] Kidney  stones   . Lower limb amputation, below knee   . Myocardial infarction ([place]) 2005  . Neuromuscular disorder ([place])    diabetic neuropathy  . NSTEMI (non-ST elevated myocardial infarction) ([place]) 04/2016  . PAD (peripheral artery disease) ([place])    a. R CEA 2007. b. Hx L BKA in 2013. c. s/p LE angioplasty in 2014. d. Hx R BKA in 2014.  [name] Peripheral neuropathy   . Prostate cancer ([place]) 2010  . Secondary hyperparathyroidism ([place])    Secondary Hyperpara- Thyroidism, Renal  . Type II diabetes mellitus ([place])     Past Surgical History:  Procedure Laterality Date  . ABDOMINAL AORTAGRAM N/A 05/28/2011   Procedure: ABDOMINAL Maxcine Ham;  Surgeon: Elam Dutch, MD;  Location: [facility] CATH LAB;  Service: Cardiovascular;  Laterality: N/A;  . ABDOMINAL AORTAGRAM N/A 06/02/2012   Procedure: ABDOMINAL Maxcine Ham;  Surgeon: Elam Dutch, MD;  Location: [facility] CATH LAB;  Service: Cardiovascular;  Laterality: N/A;  . ABDOMINAL AORTAGRAM N/A 06/09/2012   Procedure: ABDOMINAL Maxcine Ham;  Surgeon: Elam Dutch, MD;  Location: [facility] CATH LAB;  Service: Cardiovascular;  Laterality: N/A;  . AMPUTATION  06/14/2011   Procedure: AMPUTATION DIGIT;  Surgeon: Elam Dutch, MD;  Location: [facility] OR;  Service: Vascular;  Laterality: Left;  Amputation Left fifth toe  . AMPUTATION  06/16/2011   Procedure: AMPUTATION BELOW KNEE;  Surgeon: Elam Dutch,  MD;  Location: [place];  Service: Vascular;  Laterality: Left;  . AMPUTATION Right 06/12/2012   Procedure: AMPUTATION DIGIT;  Surgeon: Elam Dutch, MD;  Location: [facility] OR;  Service: Vascular;  Laterality: Right;  GREAT TOE  . AMPUTATION Right 08/08/2012   Procedure: AMPUTATION BELOW KNEE;  Surgeon: Elam Dutch, MD;  Location: [place];  Service: Vascular;  Laterality: Right;  . AV FISTULA PLACEMENT Right 04/19/2014   Procedure: RIGHT ARM ARTERIOVENOUS (AV) FISTULA CREATION;  Surgeon: Mal Misty, MD;  Location: [place];  Service: Vascular;  Laterality: Right;  . AV FISTULA  PLACEMENT Left 12/12/2014   Procedure: BRACHIOCEPHALIC ARTERIOVENOUS (AV) FISTULA CREATION;  Surgeon: Mal Misty, MD;  Location: [place];  Service: Vascular;  Laterality: Left;  . CARDIAC CATHETERIZATION  05/28/11  . CARDIAC CATHETERIZATION N/A 10/04/2014   Procedure: Left Heart Cath and Coronary Angiography;  Surgeon: Sherren Mocha, MD;  Location: [place] CV LAB;  Service: Cardiovascular;  Laterality: N/A;  . CAROTID ENDARTERECTOMY Right 2005  . CATARACT EXTRACTION W/ INTRAOCULAR LENS  IMPLANT, BILATERAL Bilateral 2011  . COLONOSCOPY    . COLONOSCOPY N/A 05/03/2015   Procedure: COLONOSCOPY;  Surgeon: Gatha Mayer, MD;  Location: [place];  Service: Endoscopy;  Laterality: N/A;  . CORONARY ARTERY BYPASS GRAFT  2005   CABG X4  . ENDARTERECTOMY Left 10/17/2014   Procedure: ENDARTERECTOMY CAROTID;  Surgeon: Mal Misty, MD;  Location: [place];  Service: Vascular;  Laterality: Left;  . I&D EXTREMITY  01/31/2012   Procedure: IRRIGATION AND DEBRIDEMENT EXTREMITY;  Surgeon: Elam Dutch, MD;  Location: [place];  Service: Vascular;  Laterality: Left;  I & D Left BKA   . INSERTION OF DIALYSIS CATHETER Right 04/19/2014   Procedure: INSERTION OF DIALYSIS CATHETER-INTERNAL JUGULAR;  Surgeon: Mal Misty, MD;  Location: [place];  Service: Vascular;  Laterality: Right;  . INSERTION OF DIALYSIS CATHETER N/A 11/06/2014   Procedure: INSERTION OF 23cm DIALYSIS CATHETER - right internal jugular;  Surgeon: Mal Misty, MD;  Location: [place];  Service: Vascular;  Laterality: N/A;  . KNEE CARTILAGE SURGERY Left 1964  . LEFT HEART CATH AND CORONARY ANGIOGRAPHY N/A 04/12/2016   Procedure: Left Heart Cath and Coronary Angiography;  Surgeon: Nelva Bush, MD;  Location: [place] CV LAB;  Service: Cardiovascular;  Laterality: N/A;  . LIGATION OF ARTERIOVENOUS  FISTULA Right 11/06/2014   Procedure: LIGATION OF RADIOCEPHALIC ARTERIOVENOUS FISTULA;  Surgeon: Mal Misty, MD;  Location: [place];  Service:  Vascular;  Laterality: Right;  . LIGATION OF COMPETING BRANCHES OF ARTERIOVENOUS FISTULA Right 06/19/2014   Procedure: Right Arm LIGATION OF COMPETING BRANCHES OF RADIOCEPHALIC ARTERIOVENOUS FISTULA;  Surgeon: Mal Misty, MD;  Location: [place];  Service: Vascular;  Laterality: Right;  . PROSTATECTOMY  2009     reports that he quit smoking about 26 years ago. His smoking use included cigarettes. He has a 30.00 pack-year smoking history. He has never used smokeless tobacco. He reports that he drinks alcohol. He reports that he does not use drugs.  Allergies  Allergen Reactions  . Oxycodone Other (See Comments)    Hallucinations    Family History  Problem Relation Age of Onset  . Hyperlipidemia Mother   . Hypertension Mother   . Cancer Mother   . Hyperlipidemia Father   . Hypertension Father   . Kidney disease Father   . Heart disease Sister   . Alzheimer's disease Sister   . Cancer Brother   .  Other Sister   . Coronary artery disease Neg Hx   . Anesthesia problems Neg Hx   . Hypotension Neg Hx   . Malignant hyperthermia Neg Hx   . Pseudochol deficiency Neg Hx     Prior to Admission medications   Medication Sig Start Date End Date Taking? Authorizing Provider  amLODipine (NORVASC) 5 MG tablet Take 1 tablet (5 mg total) by mouth daily. Take one tablet by mouth once daily 04/13/16  Yes Rai, Ripudeep K, MD  aspirin EC 81 MG tablet Take 81 mg by mouth daily.   Yes [provider]  AURYXIA 1 GM 210 MG(Fe) tablet Take 420 mg by mouth 3 (three) times daily. 03/18/16  Yes [provider]  calcium acetate (PHOSLO) 667 MG capsule Take 667 mg by mouth daily. 08/27/15  Yes [provider]  carvedilol (COREG) 6.25 MG tablet Take 1 tablet (6.25 mg total) by mouth 2 (two) times daily with a meal. 07/06/16  Yes [name], MD  hydrALAZINE (APRESOLINE) 25 MG tablet Take 1 tablet (25 mg total) by mouth every 8 (eight) hours. 04/13/16  Yes Rai, Ripudeep K, MD  isosorbide  mononitrate (IMDUR) 30 MG 24 hr tablet Take 1 tablet (30 mg total) by mouth daily. 04/14/16  Yes Rai, Ripudeep K, MD  LEVEMIR FLEXTOUCH 100 UNIT/ML Pen Inject 13 Units as directed daily.  04/05/16  Yes [provider]  NOVOLOG FLEXPEN 100 UNIT/ML FlexPen CBG 70 - 120: 0 units  CBG 121 - 150: 1 unit  CBG 151 - 200: 2 units  CBG 201 - 250: 3 units  CBG 251 - 300: 5 units  CBG 301 - 350: 7 units  CBG 351 - 400: 9 units 04/06/16  Yes [provider]  polyethylene glycol (MIRALAX) packet Take 17 g by mouth daily. 04/05/16  Yes Estill Dooms, MD  pravastatin (PRAVACHOL) 40 MG tablet Take 40 mg by mouth daily.   Yes [provider]  pregabalin (LYRICA) 75 MG capsule Take 75 mg by mouth as needed.    Yes [provider]  QUEtiapine (SEROQUEL) 25 MG tablet Take 1 tablet (25 mg total) by mouth daily. Patient taking differently: Take 25 mg by mouth as needed.  04/05/16  Yes Estill Dooms, MD  warfarin (COUMADIN) 7.5 MG tablet Take 1 tablet (7.5 mg total) by mouth daily at 6 PM. Patient taking differently: Take 7.5 mg by mouth See admin instructions. 7.5 mg every evening, EXCEPT Sunday take 11.25 mg 07/06/16  Yes [name], MD    Physical Exam: Vitals:   06/29/17 2200 06/29/17 2300 06/30/17 0030 06/30/17 0105  BP: (!) 169/80 (!) 168/73 (!) 165/79 (!) 156/75  Pulse: 83 81 81 79  Resp: 17 16 16 17   Temp:    98.7 F (37.1 C)  TempSrc:    Oral  SpO2: 93% 98% 98% 100%  Weight:    72.5 kg (159 lb 13.3 oz)      Constitutional: Moderately built and nourished. Vitals:   06/29/17 2200 06/29/17 2300 06/30/17 0030 06/30/17 0105  BP: (!) 169/80 (!) 168/73 (!) 165/79 (!) 156/75  Pulse: 83 81 81 79  Resp: 17 16 16 17   Temp:    98.7 F (37.1 C)  TempSrc:    Oral  SpO2: 93% 98% 98% 100%  Weight:    72.5 kg (159 lb 13.3 oz)   Eyes: Anicteric no pallor. ENMT: No discharge from the ears eyes nose or mouth. Neck: No mass felt.  No neck rigidity.  No JVD  appreciated. Respiratory: No rhonchi or crepitations. Cardiovascular: S1-S2 heard no murmurs appreciated. Abdomen: Soft nontender bowel sounds present. Musculoskeletal: Bilateral BKA. Skin: No rash. Neurologic: Alert awake oriented to time place and person.  Moves all extremities. Psychiatric: Appears normal.  Normal affect.   Labs on Admission: I have personally reviewed following labs and imaging studies  CBC: Recent Labs  Lab 06/29/17 2052  WBC 7.3  HGB 11.6*  HCT 38.2*  MCV 102.4*  PLT 532   Basic Metabolic Panel: Recent Labs  Lab 06/29/17 2052  NA 138  K 5.1  CL 94*  CO2 26  GLUCOSE 175*  BUN 25*  CREATININE 5.94*  CALCIUM 8.9   GFR: CrCl cannot be calculated (Unknown ideal weight.). Liver Function Tests: Recent Labs  Lab 06/29/17 2052  AST 78*  ALT 97*  ALKPHOS 120  BILITOT 1.7*  PROT 7.4  ALBUMIN 4.0   Recent Labs  Lab 06/29/17 2052  LIPASE 47   No results for input(s): AMMONIA in the last 168 hours. Coagulation Profile: Recent Labs  Lab 06/30/17 0042  INR 1.35   Cardiac Enzymes: No results for input(s): CKTOTAL, CKMB, CKMBINDEX, TROPONINI in the last 168 hours. BNP (last 3 results) No results for input(s): PROBNP in the last 8760 hours. HbA1C: No results for input(s): HGBA1C in the last 72 hours. CBG: Recent Labs  Lab 06/29/17 2021 06/30/17 0151  GLUCAP 158* 171*   Lipid Profile: No results for input(s): CHOL, HDL, LDLCALC, TRIG, CHOLHDL, LDLDIRECT in the last 72 hours. Thyroid Function Tests: No results for input(s): TSH, T4TOTAL, FREET4, T3FREE, THYROIDAB in the last 72 hours. Anemia Panel: No results for input(s): VITAMINB12, FOLATE, FERRITIN, TIBC, IRON, RETICCTPCT in the last 72 hours. Urine analysis:    Component Value Date/Time   COLORURINE YELLOW 10/15/2014 1344   APPEARANCEUR CLEAR 10/15/2014 1344   APPEARANCEUR Turbid (A) 07/12/2014 1644   LABSPEC 1.017 10/15/2014 1344   PHURINE 6.0 10/15/2014 1344   GLUCOSEU  >1000 (A) 10/15/2014 1344   HGBUR SMALL (A) 10/15/2014 1344   BILIRUBINUR NEGATIVE 10/15/2014 1344   BILIRUBINUR Negative 07/12/2014 1644   KETONESUR NEGATIVE 10/15/2014 1344   PROTEINUR >300 (A) 10/15/2014 1344   UROBILINOGEN 0.2 10/15/2014 1344   NITRITE NEGATIVE 10/15/2014 1344   LEUKOCYTESUR NEGATIVE 10/15/2014 1344   LEUKOCYTESUR 3+ (A) 07/12/2014 1644   Sepsis Labs: @LABRCNTIP (procalcitonin:4,lacticidven:4) )No results found for this or any previous visit (from the past 240 hour(s)).   Radiological Exams on Admission: US Abdomen Limited Ruq  Result Date: 06/29/2017 CLINICAL DATA:  77 year old male with upper abdominal pain, nausea and vomiting onset today. Dialysis patient. EXAM: ULTRASOUND ABDOMEN LIMITED RIGHT UPPER QUADRANT COMPARISON:  CT Abdomen and Pelvis 02/17/2001 FINDINGS: Gallbladder: Positive small gallstones, individually estimated at 5 millimeters (image 12). Gallbladder wall thickening is at the upper limits of normal mildly increased, 3 millimeters. No pericholecystic fluid. Equivocal sonographic Murphy sign. Common bile duct: Diameter: 4 millimeters, normal. Liver: Liver echogenicity is within normal limits. No discrete liver lesion identified. No definite intrahepatic biliary ductal dilatation, but questionable pneumobilia (images 37 and 38). Portal vein is patent on color Doppler imaging with normal direction of blood flow towards the liver. Other findings: Right pleural effusion is evident. Negative visible right kidney. IMPRESSION: 1. Cholelithiasis with equivocal gallbladder wall thickening and sonographic Murphy sign. Consider mild or early acute cholecystitis. 2. No evidence of biliary ductal obstruction. Questionable Pneumobilia, which can be a normal finding in patients who have undergone prior  ERCP/sphincterotomy. 3. Right pleural effusion. Electronically Signed   By: Genevie Ann M.D.   On: 06/29/2017 23:25    EKG: Independently reviewed.  Sinus tachycardia with  PVCs.  Assessment/Plan Active Problems:   HTN (hypertension)   Anemia in chronic renal disease   DM type 2, uncontrolled, with renal complications (HCC)   ESRD on dialysis [facility])   Atrial fibrillation (HCC)   Upper abdominal pain    1. Abdominal pain with gallstones concerning for acute cholecystitis -appreciate general surgery consult.  Patient kept n.p.o.  Will repeat LFTs.  May need MRCP if LFTs go up. 2. Hypertension presently placed on PRN IV hydralazine and scheduled dose of IV metoprolol. 3. History of atrial fibrillation presently rate controlled.  INR subtherapeutic.  Will hold Coumadin in anticipation of possible procedure.  Patient agreeable.  Follow INR.  Patient on scheduled dose of IV metoprolol for rate control. 4. Diabetes mellitus type 2 presently on sliding scale coverage. 5. ESRD on hemodialysis on Monday Wednesday and Friday.  Consult nephrology for dialysis.  Does not appear to be fluid overloaded.  Sonogram of the abdomen does show some pleural effusion. 6. Anemia likely from ESRD.  Follow CBC. 7. CAD status post CABG.  Denies any chest pain.  Chest x-ray pending.   DVT prophylaxis: SCDs. Code Status: Full code. Family Communication: Discussed with patient. Disposition Plan: Home. Consults called: General surgery. Admission status: Inpatient.   Rise Patience MD [facility] Pager [phone].  If 7PM-7AM, please contact night-coverage [email or web address] Password [email or web address]  06/30/2017, 2:21 AM

## 2017-07-01 DIAGNOSIS — R1011 Right upper quadrant pain: Secondary | ICD-10-CM

## 2017-07-01 DIAGNOSIS — Z992 Dependence on renal dialysis: Secondary | ICD-10-CM

## 2017-07-01 DIAGNOSIS — D631 Anemia in chronic kidney disease: Secondary | ICD-10-CM

## 2017-07-01 DIAGNOSIS — N186 End stage renal disease: Secondary | ICD-10-CM

## 2017-07-01 DIAGNOSIS — I10 Essential (primary) hypertension: Secondary | ICD-10-CM

## 2017-07-01 DIAGNOSIS — E1165 Type 2 diabetes mellitus with hyperglycemia: Secondary | ICD-10-CM

## 2017-07-01 DIAGNOSIS — Z9289 Personal history of other medical treatment: Secondary | ICD-10-CM

## 2017-07-01 DIAGNOSIS — I480 Paroxysmal atrial fibrillation: Secondary | ICD-10-CM

## 2017-07-01 DIAGNOSIS — E1129 Type 2 diabetes mellitus with other diabetic kidney complication: Secondary | ICD-10-CM

## 2017-07-01 LAB — CBC
HEMATOCRIT: 36 % — AB (ref 39.0–52.0)
HEMOGLOBIN: 11.1 g/dL — AB (ref 13.0–17.0)
MCH: 31.1 pg (ref 26.0–34.0)
MCHC: 30.8 g/dL (ref 30.0–36.0)
MCV: 100.8 fL — AB (ref 78.0–100.0)
Platelets: 158 10*3/uL (ref 150–400)
RBC: 3.57 MIL/uL — AB (ref 4.22–5.81)
RDW: 15.8 % — AB (ref 11.5–15.5)
WBC: 7.3 10*3/uL (ref 4.0–10.5)

## 2017-07-01 LAB — RENAL FUNCTION PANEL
ALBUMIN: 3.5 g/dL (ref 3.5–5.0)
ANION GAP: 17 — AB (ref 5–15)
BUN: 42 mg/dL — ABNORMAL HIGH (ref 6–20)
CO2: 26 mmol/L (ref 22–32)
Calcium: 8.4 mg/dL — ABNORMAL LOW (ref 8.9–10.3)
Chloride: 95 mmol/L — ABNORMAL LOW (ref 101–111)
Creatinine, Ser: 8.41 mg/dL — ABNORMAL HIGH (ref 0.61–1.24)
GFR calc Af Amer: 6 mL/min — ABNORMAL LOW (ref >60)
GFR, EST NON AFRICAN AMERICAN: 5 mL/min — AB (ref >60)
Glucose, Bld: 145 mg/dL — ABNORMAL HIGH (ref 65–99)
PHOSPHORUS: 6 mg/dL — AB (ref 2.5–4.6)
POTASSIUM: 4.2 mmol/L (ref 3.5–5.1)
Sodium: 138 mmol/L (ref 135–145)

## 2017-07-01 LAB — GLUCOSE, CAPILLARY
GLUCOSE-CAPILLARY: 81 mg/dL (ref 65–99)
Glucose-Capillary: 78 mg/dL (ref 65–99)
Glucose-Capillary: 99 mg/dL (ref 65–99)

## 2017-07-01 MED ORDER — HEPARIN SODIUM (PORCINE) 1000 UNIT/ML DIALYSIS: 1000.0000 [IU] | INTRAMUSCULAR | Status: DC | PRN

## 2017-07-01 MED ORDER — SODIUM CHLORIDE 0.9 % IV SOLN: 100.0000 mL | INTRAVENOUS | Status: DC | PRN

## 2017-07-01 MED ORDER — PENTAFLUOROPROP-TETRAFLUOROETH EX AERO: 1.0000 "application " | INHALATION_SPRAY | CUTANEOUS | Status: DC | PRN

## 2017-07-01 MED ORDER — LIDOCAINE HCL (PF) 1 % IJ SOLN: 5.0000 mL | INTRAMUSCULAR | Status: DC | PRN

## 2017-07-01 MED ORDER — LIDOCAINE-PRILOCAINE 2.5-2.5 % EX CREA: 1.0000 "application " | TOPICAL_CREAM | CUTANEOUS | Status: DC | PRN

## 2017-07-01 MED ORDER — ALTEPLASE 2 MG IJ SOLR: 2.0000 mg | Freq: Once | INTRAMUSCULAR | Status: DC | PRN

## 2017-07-01 NOTE — Care Management Note (Addendum)
Case Management Note  Patient Details  Name: Aaron Long MRN: [id number] Date of Birth: 1940/03/29  Subjective/Objective:                    Action/Plan:  PT recommendations HHPT and drop arm 3 in 1 . Discussed with patient and MD at bedside. Patient agreeable to drop arm 3 in 1 but not HHPT.  Patient requesting ambulance transport home , states he has no one to pick him and refusing [employer] Patient states he is aware he will be billed for ambulance transport .   [facility] paperwork completed for bedside nurse to call [facility] when ready for discharge. [facility] will not transport 3 in 1 home, requested [facility] to deliver to patient's home address. Confirmed face sheet information with patient. Bedside nurse Darylene Price aware and will call PT[facility]  Expected Discharge Date:  07/01/17               Expected Discharge Plan:  Home/Self Care  In-House Referral:     Discharge planning Services  CM Consult  Post Acute Care Choice:  Durable Medical Equipment, Home Health Choice offered to:  Patient  DME Arranged:  3-N-1 DME Agency:  [place]:  Patient Refused [place] Agency:  NA  Status of Service:  Completed, signed off  If discussed at [place] of Stay Meetings, dates discussed:    Additional Comments:  Marilu Favre, RN 07/01/2017, 1:42 PM

## 2017-07-01 NOTE — Evaluation (Signed)
Physical Therapy Evaluation Patient Details Name: Aaron Long MRN: [id number] DOB: 1940-09-11 Today's Date: 07/01/2017   History of Present Illness  Pt is a 77 y/o male admitted secondary to nausea/vomiting, and upper abdominal pain. HIDA scan negative for cholecystitis. PMH includes bilat BKA, ESRD on HD MWF, DM, a fib, HTN, CAD s/p CABG, DVT, and prostate cancer.   Clinical Impression  Pt admitted secondary to problem above with deficits below. Pt requiring min to mod A for basic bed mobility and performing lateral scoots along EOB. Pt with limited ability to use R hand secondary to increased pain. Pt slightly annoyed with PT throughout session, stating "I have been doing this for 3 years, why wouldn't I be safe." Educated about need for assist and pt reports wife can assist with mobility if needed. Pt very eager to go home and refusing SNF at this time. Feel pt would benefit from HHPT, however, pt will likely refuse. Will continue to follow acutely to maximize functional mobility independence and safety.   Follow Up Recommendations Home health PT;Supervision for mobility/OOB (will likely refuse HHPT; refused SNF)    Equipment Recommendations  3in1 (PT)(drop arm 3 in 1)    Recommendations for Other Services       Precautions / Restrictions Precautions Precautions: Fall;Other (comment) Precaution Comments: bilat BKA  Restrictions Weight Bearing Restrictions: No      Mobility  Bed Mobility Overal bed mobility: Needs Assistance Bed Mobility: Supine to Sit;Sit to Supine     Supine to sit: Mod assist Sit to supine: Supervision   General bed mobility comments: Mod A for trunk elevation and scooting hips to EOB. Pt reports wife sometimes assists with bed mobility. Supervision to return to supine.   Transfers Overall transfer level: Needs assistance   Transfers: Lateral/Scoot Transfers          Lateral/Scoot Transfers: Min assist;Min guard General transfer comment: Min A  For assist with performing scoot transfers along EOB. Did not have drop arm recliner or WC in room, therefore unable to perform scoot transfer to bed. Pt slightly annoyed with PT when suggesting to practice scooting along EOB. REports wife can assist at home.   Ambulation/Gait             General Gait Details: Uses WC at baseline; bilat BKA   Stairs            Wheelchair Mobility    Modified Rankin (Stroke Patients Only)       Balance Overall balance assessment: Needs assistance Sitting-balance support: No upper extremity supported;Feet unsupported Sitting balance-Leahy Scale: Fair                                       Pertinent Vitals/Pain Pain Assessment: Faces Faces Pain Scale: Hurts little more Pain Location: R hand  Pain Descriptors / Indicators: Aching Pain Intervention(s): Limited activity within patient's tolerance;Monitored during session;Repositioned    Home Living Family/patient expects to be discharged to:: Private residence Living Arrangements: Spouse/significant other Available Help at Discharge: Family;Available 24 hours/day Type of Home: House Home Access: Level entry     Home Layout: One level Home Equipment: Wheelchair - power;Grab bars - toilet;Grab bars - tub/shower;Other (comment)(slide board )      Prior Function Level of Independence: Needs assistance   Gait / Transfers Assistance Needed: Reports wife assists when needed, however, provided conflicting information. Reports he performs scoot pivot transfers to  power WC.   ADL's / Homemaking Assistance Needed: Reports he is usually independent, however, wife assists when able.         Hand Dominance        Extremity/Trunk Assessment   Upper Extremity Assessment Upper Extremity Assessment: RUE deficits/detail RUE Deficits / Details: R hand pain, so reports he is unable to use during mobility tasks.     Lower Extremity Assessment Lower Extremity Assessment: RLE  deficits/detail;LLE deficits/detail RLE Deficits / Details: R BKA at baseline  LLE Deficits / Details: L BKA at baseline.        Communication   Communication: HOH  Cognition Arousal/Alertness: Awake/alert Behavior During Therapy: Restless Overall Cognitive Status: No family/caregiver present to determine baseline cognitive functioning                                 General Comments: Pt annoyed with PT presence when suggesting to practice scooting. Pt also annoyed when PT asked questions about comfort level during transfers at home stating "I have been doing this for 3 years, why wouldn't I be safe." Reports that wife assists, however, then reports he performs transfers independently.       General Comments General comments (skin integrity, edema, etc.): No family present during session.     Exercises     Assessment/Plan    PT Assessment Patient needs continued PT services  PT Problem List Decreased strength;Decreased balance;Decreased mobility;Decreased knowledge of use of DME       PT Treatment Interventions Functional mobility training;Therapeutic exercise;Therapeutic activities;DME instruction;Balance training;Patient/family education    PT Goals (Current goals can be found in the Care Plan section)  Acute Rehab PT Goals Patient Stated Goal: to go home ASAP  PT Goal Formulation: With patient Time For Goal Achievement: 07/15/17 Potential to Achieve Goals: Fair    Frequency Min 3X/week   Barriers to discharge        Co-evaluation               AM-PAC PT "6 Clicks" Daily Activity  Outcome Measure Difficulty turning over in bed (including adjusting bedclothes, sheets and blankets)?: A Lot Difficulty moving from lying on back to sitting on the side of the bed? : Unable Difficulty sitting down on and standing up from a chair with arms (e.g., wheelchair, bedside commode, etc,.)?: Unable Help needed moving to and from a bed to chair (including a  wheelchair)?: Total Help needed walking in hospital room?: Total Help needed climbing 3-5 steps with a railing? : Total 6 Click Score: 7    End of Session   Activity Tolerance: Patient tolerated treatment well Patient left: in bed;with call bell/phone within reach;with nursing/sitter in room Nurse Communication: Mobility status PT Visit Diagnosis: Muscle weakness (generalized) (M62.81);Difficulty in walking, not elsewhere classified (R26.2)    Time: 2440-1027 PT Time Calculation (min) (ACUTE ONLY): 16 min   Charges:   PT Evaluation $PT Eval Moderate Complexity: 1 Mod     PT G Codes:        Leighton Ruff, PT, DPT  Acute Rehabilitation Services  Pager: [phone]   Rudean Hitt 07/01/2017, 1:29 PM

## 2017-07-01 NOTE — Progress Notes (Signed)
Patient discharged to Home via [facility]. After visit Summary reviewed. Patient capable of reverbalizing medications and follow up visits. No signs and symptoms of distress noted. Patient educated to return to the ED in the case of an emergency. Doris Mcgilvery RN 

## 2017-07-01 NOTE — Discharge Summary (Signed)
Physician Discharge Summary  Aaron Long YQM:[id number] DOB: 09/20/1940 DOA: 06/29/2017  PCP: Bartholome Bill, MD  Admit date: 06/29/2017 Discharge date: 07/01/2017  Admitted From: Home.  Disposition:  Home.   Recommendations for Outpatient Follow-up:  1. Follow up with PCP in 1-2 weeks 2. Please obtain BMP/CBC in one week Please follow up with surgery as needed.    Discharge Condition:stable.  CODE STATUS:full code.  Diet recommendation: Heart Healthy / Renal diet.   Brief/Interim Summary: Aaron Long a 77 y.o.malewithhistory of CAD status post CABG, atrial fibrillation, hypertension and ESRD on hemodialysis, diabetes mellitus presents to the ER with complaints of abdominal pain. In the ER sonogram of the abdomen shows features concerning for gallstones with possible cholecystitis. surgery consulted and he underwent HIDA scan which was neg for acute cholecystitis.     Discharge Diagnoses:  Active Problems:   HTN (hypertension)   Anemia in chronic renal disease   DM type 2, uncontrolled, with renal complications (HCC)   ESRD on dialysis [facility])   Atrial fibrillation (HCC)   Upper abdominal pain  abd pain , right quadrant, with mild elevation of liver function tests. Admitted for evaluation of acute cholecystitis.  But his HIDA was negative and his abd pain resolved.  Surgery consulted and recommended no indication for surgery.  Pt remained asymptomatic and he was discharged home.    Hypertension; Well controlled.    ESRD on HD.  Nephrology was consulted and underwent HD MWF.    DM: CBG (last 3)  Recent Labs    07/01/17 0326 07/01/17 0710 07/01/17 1305  GLUCAP 78 99 81   cbgs well controlled.   Anemia of chronic disease Hemoglobin stable.    CAD s/p CABG; Stable. Pt denies any chest pain or sob.    Atrial fibrillation:  Rate controlled.  Resume coumadin from tonight.    Peripheral Vascular Disease:  S/p bilateral BKA's.   Resume home meds.     Discharge Instructions  Discharge Instructions    Diet - low sodium heart healthy   Complete by:  As directed    Diet - low sodium heart healthy   Complete by:  As directed    Discharge instructions   Complete by:  As directed    Please follow up with PCP in one week.     Allergies as of 07/01/2017      Reactions   Oxycodone Other (See Comments)   Hallucinations      Medication List    TAKE these medications   amLODipine 5 MG tablet Commonly known as:  NORVASC Take 1 tablet (5 mg total) by mouth daily. Take one tablet by mouth once daily   aspirin EC 81 MG tablet Take 81 mg by mouth daily.   AURYXIA 1 GM 210 MG(Fe) tablet Generic drug:  ferric citrate Take 420 mg by mouth 3 (three) times daily.   calcium acetate 667 MG capsule Commonly known as:  PHOSLO Take 667 mg by mouth daily.   carvedilol 6.25 MG tablet Commonly known as:  COREG Take 1 tablet (6.25 mg total) by mouth 2 (two) times daily with a meal.   hydrALAZINE 25 MG tablet Commonly known as:  APRESOLINE Take 1 tablet (25 mg total) by mouth every 8 (eight) hours.   isosorbide mononitrate 30 MG 24 hr tablet Commonly known as:  IMDUR Take 1 tablet (30 mg total) by mouth daily.   LEVEMIR FLEXTOUCH 100 UNIT/ML Pen Generic drug:  Insulin Detemir Inject 13 Units  as directed daily.   NOVOLOG FLEXPEN 100 UNIT/ML FlexPen Generic drug:  insulin aspart CBG 70 - 120: 0 units  CBG 121 - 150: 1 unit  CBG 151 - 200: 2 units  CBG 201 - 250: 3 units  CBG 251 - 300: 5 units  CBG 301 - 350: 7 units  CBG 351 - 400: 9 units   polyethylene glycol packet Commonly known as:  MIRALAX Take 17 g by mouth daily.   pravastatin 40 MG tablet Commonly known as:  PRAVACHOL Take 40 mg by mouth daily.   pregabalin 75 MG capsule Commonly known as:  LYRICA Take 75 mg by mouth as needed.   QUEtiapine 25 MG tablet Commonly known as:  SEROQUEL Take 1 tablet (25 mg total) by mouth daily. What  changed:    when to take this  reasons to take this   warfarin 7.5 MG tablet Commonly known as:  COUMADIN Take 1 tablet (7.5 mg total) by mouth daily at 6 PM. What changed:    when to take this  additional instructions            Durable Medical Equipment  (From admission, onward)        Start     Ordered   07/01/17 1338  For home use only DME 3 n 1  Once    Comments:  drop arm 3 in 1   07/01/17 1337     Follow-up Information    Bartholome Bill, MD. Schedule an appointment as soon as possible for a visit in 1 week(s).   Specialty:  Family Medicine Contact information: [address] [place] [address] [phone]        Burnell Blanks, MD .   Specialty:  Cardiology Contact information: [address] [place] [address] [phone]          Allergies  Allergen Reactions  . Oxycodone Other (See Comments)    Hallucinations    Consultations:  Surgery  Nephrology  Cardiology for pre op clearance.    Procedures/Studies: Nm Hepato W/eject Fract  Addendum Date: 06/30/2017   ADDENDUM REPORT: 06/30/2017 15:30 ADDENDUM: The technologist who performed this study informed me after the initial dictation that the patient did not refuse continuing this study until after a series of counts post oral Ensure had been acquired for ejection fraction calculation. In reviewing the data, there is sufficient data to obtain an accurate ejection fraction analysis of radiotracer from the gallbladder. Ejection fraction of radiotracer from the gallbladder is normal at 59 percent, normal greater than 33 percent using the oral agent. No report of clinical symptoms with the oral Ensure consumption. This study should be denoted as EXAM: NUCLEAR MEDICINE HEPATOBILIARY IMAGING WITH GALLBLADDER EF IMPRESSION: Normal ejection fraction of radiotracer from the gallbladder. Cystic and common bile ducts are patent as is evidenced by visualization  of gallbladder and small bowel. Electronically Signed   By: Lowella Grip III M.D.   On: 06/30/2017 15:30   Result Date: 06/30/2017 CLINICAL DATA:  Abdominal pain with nausea and vomiting EXAM: NUCLEAR MEDICINE HEPATOBILIARY IMAGING TECHNIQUE: Sequential images of the abdomen were obtained out to 60 minutes following intravenous administration of radiopharmaceutical. Anterior right upper quadrant images were obtained. The patient consumed 8 ounces of Ensure but then declined to permit further imaging. Therefore, ejection fraction analysis not performed. RADIOPHARMACEUTICALS:  5.2 mCi Tc-42m  Choletec IV COMPARISON:  None. FINDINGS: Liver uptake of radiotracer is normal. Gallbladder activity is  visualized, consistent with patency of cystic duct. Biliary activity passes into small bowel, consistent with patent common bile duct. Patient declined to permit additional imaging to assess ejection fraction analysis. IMPRESSION: Patency of the cystic and common bile ducts as is evidenced by visualization of gallbladder and small bowel. Electronically Signed: By: Lowella Grip III M.D. On: 06/30/2017 14:57   Dg Chest Port 1 View  Result Date: 06/30/2017 CLINICAL DATA:  Abdominal pain EXAM: PORTABLE CHEST 1 VIEW COMPARISON:  06/28/2016 FINDINGS: Postoperative changes in the mediastinum. Mild cardiac enlargement. Interstitial pattern to the lungs likely representing pulmonary edema. No pleural effusions. No pneumothorax. No focal consolidation. Calcification of the aorta. Degenerative changes in the spine and shoulders. Surgical clips in the base of the neck. Vascular graft over the left axillary region. IMPRESSION: Cardiac enlargement with interstitial edema similar to previous study. Aortic atherosclerosis. Electronically Signed   By: Lucienne Capers M.D.   On: 06/30/2017 03:54   US Abdomen Limited Ruq  Result Date: 06/29/2017 CLINICAL DATA:  77 year old male with upper abdominal pain, nausea and vomiting  onset today. Dialysis patient. EXAM: ULTRASOUND ABDOMEN LIMITED RIGHT UPPER QUADRANT COMPARISON:  CT Abdomen and Pelvis 02/17/2001 FINDINGS: Gallbladder: Positive small gallstones, individually estimated at 5 millimeters (image 12). Gallbladder wall thickening is at the upper limits of normal mildly increased, 3 millimeters. No pericholecystic fluid. Equivocal sonographic Murphy sign. Common bile duct: Diameter: 4 millimeters, normal. Liver: Liver echogenicity is within normal limits. No discrete liver lesion identified. No definite intrahepatic biliary ductal dilatation, but questionable pneumobilia (images 37 and 38). Portal vein is patent on color Doppler imaging with normal direction of blood flow towards the liver. Other findings: Right pleural effusion is evident. Negative visible right kidney. IMPRESSION: 1. Cholelithiasis with equivocal gallbladder wall thickening and sonographic Murphy sign. Consider mild or early acute cholecystitis. 2. No evidence of biliary ductal obstruction. Questionable Pneumobilia, which can be a normal finding in patients who have undergone prior ERCP/sphincterotomy. 3. Right pleural effusion. Electronically Signed   By: Genevie Ann M.D.   On: 06/29/2017 23:25       Subjective: No abdominal pain,   Discharge Exam: Vitals:   07/01/17 1150 07/01/17 1308  BP: 140/62 (!) 154/67  Pulse: 68 74  Resp: 12 16  Temp: 97.9 F (36.6 C) 99.1 F (37.3 C)  SpO2: 100% 98%   Vitals:   07/01/17 1100 07/01/17 1130 07/01/17 1150 07/01/17 1308  BP: (!) 152/62 (!) 143/62 140/62 (!) 154/67  Pulse: 64 63 68 74  Resp:   12 16  Temp:   97.9 F (36.6 C) 99.1 F (37.3 C)  TempSrc:   Oral Oral  SpO2:   100% 98%  Weight:   71 kg (156 lb 8.4 oz)   Height:        General: Pt is alert, awake, not in acute distress Cardiovascular: RRR, S1/S2 +, no rubs, no gallops Respiratory: CTA bilaterally, no wheezing, no rhonchi Abdominal: Soft, NT, ND, bowel sounds +     The results of  significant diagnostics from this hospitalization (including imaging, microbiology, ancillary and laboratory) are listed below for reference.     Microbiology: Recent Results (from the past 240 hour(s))  MRSA PCR Screening     Status: None   Collection Time: 06/30/17  3:56 AM  Result Value Ref Range Status   MRSA by PCR NEGATIVE NEGATIVE Final    Comment:        The GeneXpert MRSA Assay (FDA approved for NASAL specimens only),  is one component of a comprehensive MRSA colonization surveillance program. It is not intended to diagnose MRSA infection nor to guide or monitor treatment for MRSA infections. Performed at [facility], [place] [address], [place], [place] [address]      Labs: BNP (last 3 results) No results for input(s): BNP in the last 8760 hours. Basic Metabolic Panel: Recent Labs  Lab 06/29/17 2052 06/30/17 0428 07/01/17 0852  NA 138 140 138  K 5.1 4.2 4.2  CL 94* 97* 95*  CO2 26 26 26   GLUCOSE 175* 161* 145*  BUN 25* 28* 42*  CREATININE 5.94* 6.33* 8.41*  CALCIUM 8.9 8.5* 8.4*  PHOS  --   --  6.0*   Liver Function Tests: Recent Labs  Lab 06/29/17 2052 06/30/17 0428 07/01/17 0852  AST 78* 64*  --   ALT 97* 96*  --   ALKPHOS 120 103  --   BILITOT 1.7* 0.8  --   PROT 7.4 6.7  --   ALBUMIN 4.0 3.6 3.5   Recent Labs  Lab 06/29/17 2052  LIPASE 47   No results for input(s): AMMONIA in the last 168 hours. CBC: Recent Labs  Lab 06/29/17 2052 06/30/17 0428 07/01/17 0853  WBC 7.3 7.3 7.3  NEUTROABS  --  5.0  --   HGB 11.6* 11.1* 11.1*  HCT 38.2* 35.7* 36.0*  MCV 102.4* 101.1* 100.8*  PLT 168 145* 158   Cardiac Enzymes: No results for input(s): CKTOTAL, CKMB, CKMBINDEX, TROPONINI in the last 168 hours. BNP: Invalid input(s): POCBNP CBG: Recent Labs  Lab 06/30/17 1633 06/30/17 2006 07/01/17 0326 07/01/17 0710 07/01/17 1305  GLUCAP 157* 230* 78 99 81   D-Dimer No results for input(s): DDIMER in the last 72 hours. Hgb A1c No  results for input(s): HGBA1C in the last 72 hours. Lipid Profile No results for input(s): CHOL, HDL, LDLCALC, TRIG, CHOLHDL, LDLDIRECT in the last 72 hours. Thyroid function studies No results for input(s): TSH, T4TOTAL, T3FREE, THYROIDAB in the last 72 hours.  Invalid input(s): FREET3 Anemia work up No results for input(s): VITAMINB12, FOLATE, FERRITIN, TIBC, IRON, RETICCTPCT in the last 72 hours. Urinalysis    Component Value Date/Time   COLORURINE YELLOW 10/15/2014 1344   APPEARANCEUR CLEAR 10/15/2014 1344   APPEARANCEUR Turbid (A) 07/12/2014 1644   LABSPEC 1.017 10/15/2014 1344   PHURINE 6.0 10/15/2014 1344   GLUCOSEU >1000 (A) 10/15/2014 1344   HGBUR SMALL (A) 10/15/2014 1344   BILIRUBINUR NEGATIVE 10/15/2014 1344   BILIRUBINUR Negative 07/12/2014 [place] 10/15/2014 1344   PROTEINUR >300 (A) 10/15/2014 1344   UROBILINOGEN 0.2 10/15/2014 1344   NITRITE NEGATIVE 10/15/2014 1344   LEUKOCYTESUR NEGATIVE 10/15/2014 1344   LEUKOCYTESUR 3+ (A) 07/12/2014 1644   Sepsis Labs Invalid input(s): PROCALCITONIN,  WBC,  LACTICIDVEN Microbiology Recent Results (from the past 240 hour(s))  MRSA PCR Screening     Status: None   Collection Time: 06/30/17  3:56 AM  Result Value Ref Range Status   MRSA by PCR NEGATIVE NEGATIVE Final    Comment:        The GeneXpert MRSA Assay (FDA approved for NASAL specimens only), is one component of a comprehensive MRSA colonization surveillance program. It is not intended to diagnose MRSA infection nor to guide or monitor treatment for MRSA infections. Performed at [facility], [place] [address], [place], [place] [address]      Time coordinating discharge: 35  minutes  SIGNED:   [name],  MD  [facility] 07/01/2017, 5:12 PM Pager   If 7PM-7AM, please contact night-coverage [email or web address] Password [email or web address]

## 2017-07-01 NOTE — Progress Notes (Signed)
Patient ID: Aaron Long, male   DOB: May 07, 1940, 77 y.o.   MRN: [id number]       Subjective: Pt has no complaints today.  Tolerated clear liquids yesterday.  On HD currently  Objective: Vital signs in last 24 hours: Temp:  [97.6 F (36.4 C)-98.4 F (36.9 C)] 97.6 F (36.4 C) (06/21 0501) Pulse Rate:  [61-67] 61 (06/21 0501) Resp:  [16-18] 16 (06/21 0501) BP: (123-137)/(64-69) 137/69 (06/21 0501) SpO2:  [94 %-100 %] 100 % (06/21 0501) Weight:  [74.4 kg (164 lb 0.4 oz)] 74.4 kg (164 lb 0.4 oz) (06/20 2008) Last BM Date: 06/27/17  Intake/Output from previous day: 06/20 0701 - 06/21 0700 In: 507.9 [P.O.:420; I.V.:46.2; IV Piggyback:41.7] Out: 75 [Urine:75] Intake/Output this shift: No intake/output data recorded.  PE: Abd: soft, NT, ND, +BS  Lab Results:  Recent Labs    06/29/17 2052 06/30/17 0428  WBC 7.3 7.3  HGB 11.6* 11.1*  HCT 38.2* 35.7*  PLT 168 145*   BMET Recent Labs    06/29/17 2052 06/30/17 0428  NA 138 140  K 5.1 4.2  CL 94* 97*  CO2 26 26  GLUCOSE 175* 161*  BUN 25* 28*  CREATININE 5.94* 6.33*  CALCIUM 8.9 8.5*   PT/INR Recent Labs    06/30/17 0042  LABPROT 16.5*  INR 1.35   CMP     Component Value Date/Time   NA 140 06/30/2017 0428   NA 134 04/10/2015 1535   K 4.2 06/30/2017 0428   CL 97 (L) 06/30/2017 0428   CO2 26 06/30/2017 0428   GLUCOSE 161 (H) 06/30/2017 0428   BUN 28 (H) 06/30/2017 0428   BUN 26 04/10/2015 1535   CREATININE 6.33 (H) 06/30/2017 0428   CALCIUM 8.5 (L) 06/30/2017 0428   PROT 6.7 06/30/2017 0428   PROT 7.0 04/10/2015 1535   ALBUMIN 3.6 06/30/2017 0428   ALBUMIN 4.2 04/10/2015 1535   AST 64 (H) 06/30/2017 0428   ALT 96 (H) 06/30/2017 0428   ALKPHOS 103 06/30/2017 0428   BILITOT 0.8 06/30/2017 0428   BILITOT 0.3 04/10/2015 1535   GFRNONAA 8 (L) 06/30/2017 0428   GFRAA 9 (L) 06/30/2017 0428   Lipase     Component Value Date/Time   LIPASE 47 06/29/2017 2052       Studies/Results: Nm Hepato  W/eject Fract  Addendum Date: 06/30/2017   ADDENDUM REPORT: 06/30/2017 15:30 ADDENDUM: The technologist who performed this study informed me after the initial dictation that the patient did not refuse continuing this study until after a series of counts post oral Ensure had been acquired for ejection fraction calculation. In reviewing the data, there is sufficient data to obtain an accurate ejection fraction analysis of radiotracer from the gallbladder. Ejection fraction of radiotracer from the gallbladder is normal at 59 percent, normal greater than 33 percent using the oral agent. No report of clinical symptoms with the oral Ensure consumption. This study should be denoted as EXAM: NUCLEAR MEDICINE HEPATOBILIARY IMAGING WITH GALLBLADDER EF IMPRESSION: Normal ejection fraction of radiotracer from the gallbladder. Cystic and common bile ducts are patent as is evidenced by visualization of gallbladder and small bowel. Electronically Signed   By: Lowella Grip III M.D.   On: 06/30/2017 15:30   Result Date: 06/30/2017 CLINICAL DATA:  Abdominal pain with nausea and vomiting EXAM: NUCLEAR MEDICINE HEPATOBILIARY IMAGING TECHNIQUE: Sequential images of the abdomen were obtained out to 60 minutes following intravenous administration of radiopharmaceutical. Anterior right upper quadrant images were obtained. The  patient consumed 8 ounces of Ensure but then declined to permit further imaging. Therefore, ejection fraction analysis not performed. RADIOPHARMACEUTICALS:  5.2 mCi Tc-30m  Choletec IV COMPARISON:  None. FINDINGS: Liver uptake of radiotracer is normal. Gallbladder activity is visualized, consistent with patency of cystic duct. Biliary activity passes into small bowel, consistent with patent common bile duct. Patient declined to permit additional imaging to assess ejection fraction analysis. IMPRESSION: Patency of the cystic and common bile ducts as is evidenced by visualization of gallbladder and small bowel.  Electronically Signed: By: Lowella Grip III M.D. On: 06/30/2017 14:57   Dg Chest Port 1 View  Result Date: 06/30/2017 CLINICAL DATA:  Abdominal pain EXAM: PORTABLE CHEST 1 VIEW COMPARISON:  06/28/2016 FINDINGS: Postoperative changes in the mediastinum. Mild cardiac enlargement. Interstitial pattern to the lungs likely representing pulmonary edema. No pleural effusions. No pneumothorax. No focal consolidation. Calcification of the aorta. Degenerative changes in the spine and shoulders. Surgical clips in the base of the neck. Vascular graft over the left axillary region. IMPRESSION: Cardiac enlargement with interstitial edema similar to previous study. Aortic atherosclerosis. Electronically Signed   By: Lucienne Capers M.D.   On: 06/30/2017 03:54   US Abdomen Limited Ruq  Result Date: 06/29/2017 CLINICAL DATA:  77 year old male with upper abdominal pain, nausea and vomiting onset today. Dialysis patient. EXAM: ULTRASOUND ABDOMEN LIMITED RIGHT UPPER QUADRANT COMPARISON:  CT Abdomen and Pelvis 02/17/2001 FINDINGS: Gallbladder: Positive small gallstones, individually estimated at 5 millimeters (image 12). Gallbladder wall thickening is at the upper limits of normal mildly increased, 3 millimeters. No pericholecystic fluid. Equivocal sonographic Murphy sign. Common bile duct: Diameter: 4 millimeters, normal. Liver: Liver echogenicity is within normal limits. No discrete liver lesion identified. No definite intrahepatic biliary ductal dilatation, but questionable pneumobilia (images 37 and 38). Portal vein is patent on color Doppler imaging with normal direction of blood flow towards the liver. Other findings: Right pleural effusion is evident. Negative visible right kidney. IMPRESSION: 1. Cholelithiasis with equivocal gallbladder wall thickening and sonographic Murphy sign. Consider mild or early acute cholecystitis. 2. No evidence of biliary ductal obstruction. Questionable Pneumobilia, which can be a  normal finding in patients who have undergone prior ERCP/sphincterotomy. 3. Right pleural effusion. Electronically Signed   By: Genevie Ann M.D.   On: 06/29/2017 23:25    Anti-infectives: Anti-infectives (From admission, onward)   Start     Dose/Rate Route Frequency Ordered Stop   06/30/17 1000  piperacillin-tazobactam (ZOSYN) IVPB 3.375 g     3.375 g 12.5 mL/hr over 240 Minutes Intravenous Every 12 hours 06/30/17 0237     06/30/17 0000  piperacillin-tazobactam (ZOSYN) IVPB 3.375 g     3.375 g 100 mL/hr over 30 Minutes Intravenous  Once 06/29/17 2358 06/30/17 0401       Assessment/Plan Cholelithiasis -HIDA scan negative for cholecystitis.  abx therapy can be stopped. -adv to low fat diet -patient is high risk and elective operations are recommended to be avoided.  Given the patient does not have acute cholecystitis, there is no need for emergent operation. -no definitive follow up needed as he is not a candidate for elective resection. -we will sign off.  DM PAD CAD, h/o MI Ischemic cardiomyopathy CHF ESRD on HD, MWF  FEN - NPO for HIDA VTE - SCDs/coumadin may be resumed from our standpoint ID - [email or web address], can be DC   LOS: 1 day    Henreitta Cea , [facility] 07/01/2017, 8:40 AM Pager: [phone]

## 2017-07-01 NOTE — Progress Notes (Signed)
[facility] ROUNDING NOTE   Subjective:     77 y.o. male with ESRD dialyzes MWF at [place] , Type 2 DM, HTN, PVD (s/p B BKAs), Hx prostate cancer, CAD (s/p CABG), and Hx DVT and atrial fibrillation (on warfarin) who was admitted with abdominal pain, possible cholecystitis. His abdominal pain appears to be improving and there his HIDA scan was negative   Patient was seen more comfortable on dialysis     Objective:  Vital signs in last 24 hours:  Temp:  [97.6 F (36.4 C)-98.4 F (36.9 C)] 97.6 F (36.4 C) (06/21 0501) Pulse Rate:  [61-68] (P) 68 (06/21 0900) Resp:  [16-18] 16 (06/21 0501) BP: (123-137)/(64-69) (P) 137/72 (06/21 0900) SpO2:  [95 %-100 %] 100 % (06/21 0501) Weight:  [164 lb 0.4 oz (74.4 kg)] 164 lb 0.4 oz (74.4 kg) (06/20 2008)  Weight change: -5 lb 15.6 oz (-2.711 kg) Filed Weights   06/29/17 1939 06/30/17 0105 06/30/17 2008  Weight: 170 lb (77.1 kg) 159 lb 13.3 oz (72.5 kg) 164 lb 0.4 oz (74.4 kg)    Intake/Output: I/O last 3 completed shifts: In: 647.9 [P.O.:420; I.V.:86.2; IV Piggyback:141.7] Out: 75 [Urine:75]   Intake/Output this shift:  No intake/output data recorded.  CVS- RRR RS- CTA  Some decrease at base ABD- BS sort and some mild discomfort upper abdomen EXT- no edema  AVF + thrill    Basic Metabolic Panel: Recent Labs  Lab 06/29/17 2052 06/30/17 0428 07/01/17 0852  NA 138 140 138  K 5.1 4.2 4.2  CL 94* 97* 95*  CO2 26 26 26   GLUCOSE 175* 161* 145*  BUN 25* 28* 42*  CREATININE 5.94* 6.33* 8.41*  CALCIUM 8.9 8.5* 8.4*  PHOS  --   --  6.0*    Liver Function Tests: Recent Labs  Lab 06/29/17 2052 06/30/17 0428 07/01/17 0852  AST 78* 64*  --   ALT 97* 96*  --   ALKPHOS 120 103  --   BILITOT 1.7* 0.8  --   PROT 7.4 6.7  --   ALBUMIN 4.0 3.6 3.5   Recent Labs  Lab 06/29/17 2052  LIPASE 47   No results for input(s): AMMONIA in the last 168 hours.  CBC: Recent Labs  Lab 06/29/17 2052 06/30/17 0428  07/01/17 0853  WBC 7.3 7.3 7.3  NEUTROABS  --  5.0  --   HGB 11.6* 11.1* 11.1*  HCT 38.2* 35.7* 36.0*  MCV 102.4* 101.1* 100.8*  PLT 168 145* 158    Cardiac Enzymes: No results for input(s): CKTOTAL, CKMB, CKMBINDEX, TROPONINI in the last 168 hours.  BNP: Invalid input(s): POCBNP  CBG: Recent Labs  Lab 06/30/17 1126 06/30/17 1633 06/30/17 2006 07/01/17 0326 07/01/17 0710  GLUCAP 107* 157* 230* 78 99    Microbiology: Results for orders placed or performed during the hospital encounter of 06/29/17  MRSA PCR Screening     Status: None   Collection Time: 06/30/17  3:56 AM  Result Value Ref Range Status   MRSA by PCR NEGATIVE NEGATIVE Final    Comment:        The GeneXpert MRSA Assay (FDA approved for NASAL specimens only), is one component of a comprehensive MRSA colonization surveillance program. It is not intended to diagnose MRSA infection nor to guide or monitor treatment for MRSA infections. Performed at [facility], [place] [address] [place], [place] [address]     Coagulation Studies: Recent Labs    06/30/17 [phone]  LABPROT 16.5*  INR 1.35    Urinalysis: No results for input(s): COLORURINE, LABSPEC, PHURINE, GLUCOSEU, HGBUR, BILIRUBINUR, KETONESUR, PROTEINUR, UROBILINOGEN, NITRITE, LEUKOCYTESUR in the last 72 hours.  Invalid input(s): APPERANCEUR    Imaging: Nm Hepato W/eject Fract  Addendum Date: 06/30/2017   ADDENDUM REPORT: 06/30/2017 15:30 ADDENDUM: The technologist who performed this study informed me after the initial dictation that the patient did not refuse continuing this study until after a series of counts post oral Ensure had been acquired for ejection fraction calculation. In reviewing the data, there is sufficient data to obtain an accurate ejection fraction analysis of radiotracer from the gallbladder. Ejection fraction of radiotracer from the gallbladder is normal at 59 percent, normal greater than 33 percent using the oral agent.  No report of clinical symptoms with the oral Ensure consumption. This study should be denoted as EXAM: NUCLEAR MEDICINE HEPATOBILIARY IMAGING WITH GALLBLADDER EF IMPRESSION: Normal ejection fraction of radiotracer from the gallbladder. Cystic and common bile ducts are patent as is evidenced by visualization of gallbladder and small bowel. Electronically Signed   By: Lowella Grip III M.D.   On: 06/30/2017 15:30   Result Date: 06/30/2017 CLINICAL DATA:  Abdominal pain with nausea and vomiting EXAM: NUCLEAR MEDICINE HEPATOBILIARY IMAGING TECHNIQUE: Sequential images of the abdomen were obtained out to 60 minutes following intravenous administration of radiopharmaceutical. Anterior right upper quadrant images were obtained. The patient consumed 8 ounces of Ensure but then declined to permit further imaging. Therefore, ejection fraction analysis not performed. RADIOPHARMACEUTICALS:  5.2 mCi Tc-87m  Choletec IV COMPARISON:  None. FINDINGS: Liver uptake of radiotracer is normal. Gallbladder activity is visualized, consistent with patency of cystic duct. Biliary activity passes into small bowel, consistent with patent common bile duct. Patient declined to permit additional imaging to assess ejection fraction analysis. IMPRESSION: Patency of the cystic and common bile ducts as is evidenced by visualization of gallbladder and small bowel. Electronically Signed: By: Lowella Grip III M.D. On: 06/30/2017 14:57   Dg Chest Port 1 View  Result Date: 06/30/2017 CLINICAL DATA:  Abdominal pain EXAM: PORTABLE CHEST 1 VIEW COMPARISON:  06/28/2016 FINDINGS: Postoperative changes in the mediastinum. Mild cardiac enlargement. Interstitial pattern to the lungs likely representing pulmonary edema. No pleural effusions. No pneumothorax. No focal consolidation. Calcification of the aorta. Degenerative changes in the spine and shoulders. Surgical clips in the base of the neck. Vascular graft over the left axillary region.  IMPRESSION: Cardiac enlargement with interstitial edema similar to previous study. Aortic atherosclerosis. Electronically Signed   By: Lucienne Capers M.D.   On: 06/30/2017 03:54   US Abdomen Limited Ruq  Result Date: 06/29/2017 CLINICAL DATA:  77 year old male with upper abdominal pain, nausea and vomiting onset today. Dialysis patient. EXAM: ULTRASOUND ABDOMEN LIMITED RIGHT UPPER QUADRANT COMPARISON:  CT Abdomen and Pelvis 02/17/2001 FINDINGS: Gallbladder: Positive small gallstones, individually estimated at 5 millimeters (image 12). Gallbladder wall thickening is at the upper limits of normal mildly increased, 3 millimeters. No pericholecystic fluid. Equivocal sonographic Murphy sign. Common bile duct: Diameter: 4 millimeters, normal. Liver: Liver echogenicity is within normal limits. No discrete liver lesion identified. No definite intrahepatic biliary ductal dilatation, but questionable pneumobilia (images 37 and 38). Portal vein is patent on color Doppler imaging with normal direction of blood flow towards the liver. Other findings: Right pleural effusion is evident. Negative visible right kidney. IMPRESSION: 1. Cholelithiasis with equivocal gallbladder wall thickening and sonographic Murphy sign. Consider mild or early acute cholecystitis. 2. No evidence of biliary ductal  obstruction. Questionable Pneumobilia, which can be a normal finding in patients who have undergone prior ERCP/sphincterotomy. 3. Right pleural effusion. Electronically Signed   By: Genevie Ann M.D.   On: 06/29/2017 23:25     Medications:   . sodium chloride 10 mL/hr at 06/30/17 0324  . sodium chloride    . sodium chloride    . piperacillin-tazobactam (ZOSYN)  IV 3.375 g (06/30/17 2131)   . Chlorhexidine Gluconate Cloth  6 each Topical Q0600  . insulin aspart  0-9 Units Subcutaneous Q4H  . metoprolol tartrate  2.5 mg Intravenous Q8H   sodium chloride, sodium chloride, acetaminophen **OR** acetaminophen, alteplase, heparin,  hydrALAZINE, lidocaine (PF), lidocaine-prilocaine, ondansetron **OR** ondansetron (ZOFRAN) IV, pentafluoroprop-tetrafluoroeth  Assessment/ Plan:  1.  Abdominal Pain with questionable Cholecystitis/+ cholelithiasis: HIDA  Negative and surgery signed off  2.  ESRD: Usual MWF schedule No heparin. Appears comfortable on dialysis  3.  Hypertension/volume: BP reasonable, no edema   4.  Anemia: Hgb 11.1 No ESA for now. 5.  Metabolic bone disease: Ca ok  6.  Type 2 DM 7. CAD (s/p CABG) 8. PVD (s/p B BKAs) 9. A-fib (on home warfarin): anticoagulation on hold for now.  Patient is stable from renal standpoint to be discharged after dialysis      LOS: 1 Annikah Lovins W @TODAY @11 :01 AM

## 2017-07-04 DIAGNOSIS — N2581 Secondary hyperparathyroidism of renal origin: Secondary | ICD-10-CM | POA: Diagnosis not present

## 2017-07-04 DIAGNOSIS — I4891 Unspecified atrial fibrillation: Secondary | ICD-10-CM | POA: Diagnosis not present

## 2017-07-04 DIAGNOSIS — D631 Anemia in chronic kidney disease: Secondary | ICD-10-CM | POA: Diagnosis not present

## 2017-07-04 DIAGNOSIS — E119 Type 2 diabetes mellitus without complications: Secondary | ICD-10-CM | POA: Diagnosis not present

## 2017-07-04 DIAGNOSIS — Z7901 Long term (current) use of anticoagulants: Secondary | ICD-10-CM | POA: Diagnosis not present

## 2017-07-04 DIAGNOSIS — N186 End stage renal disease: Secondary | ICD-10-CM | POA: Diagnosis not present

## 2017-07-06 DIAGNOSIS — N186 End stage renal disease: Secondary | ICD-10-CM | POA: Diagnosis not present

## 2017-07-06 DIAGNOSIS — D631 Anemia in chronic kidney disease: Secondary | ICD-10-CM | POA: Diagnosis not present

## 2017-07-06 DIAGNOSIS — N2581 Secondary hyperparathyroidism of renal origin: Secondary | ICD-10-CM | POA: Diagnosis not present

## 2017-07-06 DIAGNOSIS — E119 Type 2 diabetes mellitus without complications: Secondary | ICD-10-CM | POA: Diagnosis not present

## 2017-07-08 DIAGNOSIS — E119 Type 2 diabetes mellitus without complications: Secondary | ICD-10-CM | POA: Diagnosis not present

## 2017-07-08 DIAGNOSIS — N2581 Secondary hyperparathyroidism of renal origin: Secondary | ICD-10-CM | POA: Diagnosis not present

## 2017-07-08 DIAGNOSIS — D631 Anemia in chronic kidney disease: Secondary | ICD-10-CM | POA: Diagnosis not present

## 2017-07-08 DIAGNOSIS — N186 End stage renal disease: Secondary | ICD-10-CM | POA: Diagnosis not present

## 2017-07-11 DIAGNOSIS — Z992 Dependence on renal dialysis: Secondary | ICD-10-CM | POA: Diagnosis not present

## 2017-07-11 DIAGNOSIS — E876 Hypokalemia: Secondary | ICD-10-CM | POA: Diagnosis not present

## 2017-07-11 DIAGNOSIS — D631 Anemia in chronic kidney disease: Secondary | ICD-10-CM | POA: Diagnosis not present

## 2017-07-11 DIAGNOSIS — Z7901 Long term (current) use of anticoagulants: Secondary | ICD-10-CM | POA: Diagnosis not present

## 2017-07-11 DIAGNOSIS — N186 End stage renal disease: Secondary | ICD-10-CM | POA: Diagnosis not present

## 2017-07-11 DIAGNOSIS — E119 Type 2 diabetes mellitus without complications: Secondary | ICD-10-CM | POA: Diagnosis not present

## 2017-07-11 DIAGNOSIS — E1122 Type 2 diabetes mellitus with diabetic chronic kidney disease: Secondary | ICD-10-CM | POA: Diagnosis not present

## 2017-07-11 DIAGNOSIS — N2581 Secondary hyperparathyroidism of renal origin: Secondary | ICD-10-CM | POA: Diagnosis not present

## 2017-07-11 DIAGNOSIS — I4891 Unspecified atrial fibrillation: Secondary | ICD-10-CM | POA: Diagnosis not present

## 2017-07-13 DIAGNOSIS — N2581 Secondary hyperparathyroidism of renal origin: Secondary | ICD-10-CM | POA: Diagnosis not present

## 2017-07-13 DIAGNOSIS — E876 Hypokalemia: Secondary | ICD-10-CM | POA: Diagnosis not present

## 2017-07-13 DIAGNOSIS — E119 Type 2 diabetes mellitus without complications: Secondary | ICD-10-CM | POA: Diagnosis not present

## 2017-07-13 DIAGNOSIS — D631 Anemia in chronic kidney disease: Secondary | ICD-10-CM | POA: Diagnosis not present

## 2017-07-13 DIAGNOSIS — N186 End stage renal disease: Secondary | ICD-10-CM | POA: Diagnosis not present

## 2017-07-15 DIAGNOSIS — E876 Hypokalemia: Secondary | ICD-10-CM | POA: Diagnosis not present

## 2017-07-15 DIAGNOSIS — D631 Anemia in chronic kidney disease: Secondary | ICD-10-CM | POA: Diagnosis not present

## 2017-07-15 DIAGNOSIS — N2581 Secondary hyperparathyroidism of renal origin: Secondary | ICD-10-CM | POA: Diagnosis not present

## 2017-07-15 DIAGNOSIS — E119 Type 2 diabetes mellitus without complications: Secondary | ICD-10-CM | POA: Diagnosis not present

## 2017-07-15 DIAGNOSIS — N186 End stage renal disease: Secondary | ICD-10-CM | POA: Diagnosis not present

## 2017-07-18 DIAGNOSIS — Z7901 Long term (current) use of anticoagulants: Secondary | ICD-10-CM | POA: Diagnosis not present

## 2017-07-18 DIAGNOSIS — N186 End stage renal disease: Secondary | ICD-10-CM | POA: Diagnosis not present

## 2017-07-18 DIAGNOSIS — E119 Type 2 diabetes mellitus without complications: Secondary | ICD-10-CM | POA: Diagnosis not present

## 2017-07-18 DIAGNOSIS — N2581 Secondary hyperparathyroidism of renal origin: Secondary | ICD-10-CM | POA: Diagnosis not present

## 2017-07-18 DIAGNOSIS — E876 Hypokalemia: Secondary | ICD-10-CM | POA: Diagnosis not present

## 2017-07-18 DIAGNOSIS — I4891 Unspecified atrial fibrillation: Secondary | ICD-10-CM | POA: Diagnosis not present

## 2017-07-18 DIAGNOSIS — D631 Anemia in chronic kidney disease: Secondary | ICD-10-CM | POA: Diagnosis not present

## 2017-07-20 DIAGNOSIS — E119 Type 2 diabetes mellitus without complications: Secondary | ICD-10-CM | POA: Diagnosis not present

## 2017-07-20 DIAGNOSIS — E876 Hypokalemia: Secondary | ICD-10-CM | POA: Diagnosis not present

## 2017-07-20 DIAGNOSIS — D631 Anemia in chronic kidney disease: Secondary | ICD-10-CM | POA: Diagnosis not present

## 2017-07-20 DIAGNOSIS — N186 End stage renal disease: Secondary | ICD-10-CM | POA: Diagnosis not present

## 2017-07-20 DIAGNOSIS — N2581 Secondary hyperparathyroidism of renal origin: Secondary | ICD-10-CM | POA: Diagnosis not present

## 2017-07-21 ENCOUNTER — Other Ambulatory Visit: Payer: Self-pay

## 2017-07-21 ENCOUNTER — Ambulatory Visit (HOSPITAL_COMMUNITY)
Admission: RE | Admit: 2017-07-21 | Discharge: 2017-07-21 | Disposition: A | Discharge status: Home / Self Care | Payer: Medicare Other | Source: Ambulatory Visit | Attending: Vascular Surgery | Admitting: Vascular Surgery

## 2017-07-21 ENCOUNTER — Other Ambulatory Visit: Payer: Self-pay | Admitting: *Deleted

## 2017-07-21 ENCOUNTER — Other Ambulatory Visit: Payer: Self-pay | Admitting: Vascular Surgery

## 2017-07-21 ENCOUNTER — Encounter: Payer: Self-pay | Admitting: Vascular Surgery

## 2017-07-21 ENCOUNTER — Ambulatory Visit (INDEPENDENT_AMBULATORY_CARE_PROVIDER_SITE_OTHER): Payer: Medicare Other | Admitting: Vascular Surgery

## 2017-07-21 VITALS — BP 150/70 | HR 97 | Temp 97.3°F | Resp 20 | Wt 156.0 lb

## 2017-07-21 DIAGNOSIS — M79601 Pain in right arm: Secondary | ICD-10-CM

## 2017-07-21 DIAGNOSIS — I739 Peripheral vascular disease, unspecified: Secondary | ICD-10-CM | POA: Diagnosis not present

## 2017-07-21 DIAGNOSIS — T82510D Breakdown (mechanical) of surgically created arteriovenous fistula, subsequent encounter: Secondary | ICD-10-CM

## 2017-07-21 DIAGNOSIS — M7989 Other specified soft tissue disorders: Secondary | ICD-10-CM | POA: Diagnosis not present

## 2017-07-21 DIAGNOSIS — I6523 Occlusion and stenosis of bilateral carotid arteries: Secondary | ICD-10-CM

## 2017-07-21 NOTE — Progress Notes (Signed)
Patient is a 77 year old male referred for further evaluation of right hand pain symptoms.  He was recently seen by Dr. Burney Gauze and thought to have pain in his hand secondary to ischemia.  Patient has had previous access procedures in the right hand.  These were all ligated for steal.  The patient still complains of difficulty moving the right hand and coolness.  He currently has a functioning fistula on the left side.  Chronic medical problems include end-stage renal disease on dialysis.  Severe peripheral arterial disease with bilateral leg amputations in the past.  He also has hypertension hyperlipidemia coronary artery disease all of which are currently stable.  His dialysis schedule is Monday Wednesday Friday.  Past Medical History:  Diagnosis Date  . Abnormality of gait   . Anemia   . BPH (benign prostatic hypertrophy)   . Chronic combined systolic and diastolic CHF (congestive heart failure) ([place])   . Complication of anesthesia    "he gets delirious"  . Constipation    takes Miralax daily  . Coronary artery disease    a. s/p NSTEMI/CABGx4 in 2007 - LIMA-LAD, SVG-optional diagonal, SVVG-OM, SVG-dRCA. b. Nuc 05/2011: nonischemic. c. all bypass grafts widely patent, 80% stenosis involving the grafted OM proximal to SVG anastomosis, which backfills the distal LCx, moderately elevated LVEDP, EF 25%.   . Diabetic peripheral neuropathy ([place])   . Dieulafoy lesion of rectum 05/03/2015  . Disorder of bone and cartilage, unspecified   . DVT (deep venous thrombosis) ([place])    a. Lower extremity DVT in 2013.  [name] ESRD on dialysis [facility])    M-W-F  . Gangrene of toe ([facility])    dry  . Hemorrhoids   . Hx of colonic polyps   . Hyperlipidemia   . Hypertension   . Hypertrophy of prostate without urinary obstruction and other lower urinary tract symptoms (LUTS)   . Ischemic cardiomyopathy    a. EF 40% in 2007. b. 51% by nuc 05/2011. c. EF 25% by cath and 35-40% by echo in 2018.  [name] Kidney stones   . Lower  limb amputation, below knee   . Myocardial infarction ([place]) 2005  . Neuromuscular disorder ([place])    diabetic neuropathy  . NSTEMI (non-ST elevated myocardial infarction) ([place]) 04/2016  . PAD (peripheral artery disease) ([place])    a. R CEA 2007. b. Hx L BKA in 2013. c. s/p LE angioplasty in 2014. d. Hx R BKA in 2014.  [name] PAF (paroxysmal atrial fibrillation) ([place])    a. dx 06/2016.  [name] Peripheral neuropathy   . Prostate cancer ([place]) 2010  . Secondary hyperparathyroidism ([place])    Secondary Hyperpara- Thyroidism, Renal  . Type II diabetes mellitus ([place])    Family History  Problem Relation Age of Onset  . Hyperlipidemia Mother   . Hypertension Mother   . Cancer Mother   . Hyperlipidemia Father   . Hypertension Father   . Kidney disease Father   . Heart disease Sister   . Alzheimer's disease Sister   . Cancer Brother   . Other Sister   . Coronary artery disease Neg Hx   . Anesthesia problems Neg Hx   . Hypotension Neg Hx   . Malignant hyperthermia Neg Hx   . Pseudochol deficiency Neg Hx     Social History   Socioeconomic History  . Marital status: Married    Spouse name: Not on file  . Number of children: 3  . Years of education: Not on file  .  Highest education level: Not on file  Occupational History  . Occupation: Manufacturing systems engineer  Social Needs  . Financial resource strain: Not on file  . Food insecurity:    Worry: Not on file    Inability: Not on file  . Transportation needs:    Medical: Not on file    Non-medical: Not on file  Tobacco Use  . Smoking status: Former Smoker    Packs/day: 1.00    Years: 30.00    Pack years: 30.00    Types: Cigarettes    Last attempt to quit: 04/28/1991    Years since quitting: 26.2  . Smokeless tobacco: Never Used  Substance and Sexual Activity  . Alcohol use: Yes    Comment: 04/17/2014 "might have a glass of wine at Christmas"  . Drug use: No  . Sexual activity: Yes    Birth control/protection: None  Lifestyle  .  Physical activity:    Days per week: Not on file    Minutes per session: Not on file  . Stress: Not on file  Relationships  . Social connections:    Talks on phone: Not on file    Gets together: Not on file    Attends religious service: Not on file    Active member of club or organization: Not on file    Attends meetings of clubs or organizations: Not on file    Relationship status: Not on file  . Intimate partner violence:    Fear of current or ex partner: Not on file    Emotionally abused: Not on file    Physically abused: Not on file    Forced sexual activity: Not on file  Other Topics Concern  . Not on file  Social History Narrative  . Not on file    Physical exam:  Vitals:   07/21/17 1117  BP: (!) 150/70  Pulse: 97  Resp: 20  Temp: (!) 97.3 F (36.3 C)  TempSrc: Oral  SpO2: 96%  Weight: 156 lb (70.8 kg)    Extremities: Right hand is cool no palpable radial ulnar pulse left upper extremity palpable thrill audible bruit in fistula with some aneurysmal degeneration but no skin compromise bilateral below-knee amputations vaguely palpable right femoral pulse and left femoral pulse  Chest: Clear to auscultation bilaterally  Cardiac: Regular rate and rhythm  Data: Patient had a duplex ultrasound of his right upper extremity today.  This showed monophasic flow in the right radial artery.  There was biphasic flow in the brachial and ulnar arteries.  There was triphasic flow in the subclavian artery.  I reviewed and interpreted the study.  Assessment: Patient with ischemia right hand.  This may be unreconstructable.  However since he has low flow in the radial artery I believe it warrants an arteriogram to see if there is an intervention we can do to prove perfusion to his right hand.  Plan: Arch aortogram right upper extremity arteriogram scheduled for August 12, 2017.  We will reschedule his dialysis day on that day.  Risk benefits possible complications of procedure  details including but not limited to bleeding infection stroke risk of 100,000 were discussed with the patient today.  He understands and agrees to proceed.  Ruta Hinds, MD [facility] Office: [phone] Pager: [phone]

## 2017-07-22 DIAGNOSIS — D631 Anemia in chronic kidney disease: Secondary | ICD-10-CM | POA: Diagnosis not present

## 2017-07-22 DIAGNOSIS — N2581 Secondary hyperparathyroidism of renal origin: Secondary | ICD-10-CM | POA: Diagnosis not present

## 2017-07-22 DIAGNOSIS — E119 Type 2 diabetes mellitus without complications: Secondary | ICD-10-CM | POA: Diagnosis not present

## 2017-07-22 DIAGNOSIS — E876 Hypokalemia: Secondary | ICD-10-CM | POA: Diagnosis not present

## 2017-07-22 DIAGNOSIS — N186 End stage renal disease: Secondary | ICD-10-CM | POA: Diagnosis not present

## 2017-07-26 DIAGNOSIS — I132 Hypertensive heart and chronic kidney disease with heart failure and with stage 5 chronic kidney disease, or end stage renal disease: Secondary | ICD-10-CM | POA: Diagnosis not present

## 2017-07-26 DIAGNOSIS — E785 Hyperlipidemia, unspecified: Secondary | ICD-10-CM | POA: Diagnosis not present

## 2017-07-26 DIAGNOSIS — Z992 Dependence on renal dialysis: Secondary | ICD-10-CM | POA: Diagnosis not present

## 2017-07-26 DIAGNOSIS — Z794 Long term (current) use of insulin: Secondary | ICD-10-CM | POA: Diagnosis not present

## 2017-07-26 DIAGNOSIS — I4891 Unspecified atrial fibrillation: Secondary | ICD-10-CM | POA: Diagnosis not present

## 2017-07-26 DIAGNOSIS — E1122 Type 2 diabetes mellitus with diabetic chronic kidney disease: Secondary | ICD-10-CM | POA: Diagnosis not present

## 2017-07-26 DIAGNOSIS — N186 End stage renal disease: Secondary | ICD-10-CM | POA: Diagnosis not present

## 2017-07-26 DIAGNOSIS — I5042 Chronic combined systolic (congestive) and diastolic (congestive) heart failure: Secondary | ICD-10-CM | POA: Diagnosis not present

## 2017-07-26 DIAGNOSIS — Z87891 Personal history of nicotine dependence: Secondary | ICD-10-CM | POA: Diagnosis not present

## 2017-07-27 DIAGNOSIS — N186 End stage renal disease: Secondary | ICD-10-CM | POA: Diagnosis not present

## 2017-07-27 DIAGNOSIS — I4891 Unspecified atrial fibrillation: Secondary | ICD-10-CM | POA: Diagnosis not present

## 2017-07-27 DIAGNOSIS — E119 Type 2 diabetes mellitus without complications: Secondary | ICD-10-CM | POA: Diagnosis not present

## 2017-07-27 DIAGNOSIS — Z7901 Long term (current) use of anticoagulants: Secondary | ICD-10-CM | POA: Diagnosis not present

## 2017-07-27 DIAGNOSIS — E876 Hypokalemia: Secondary | ICD-10-CM | POA: Diagnosis not present

## 2017-07-27 DIAGNOSIS — N2581 Secondary hyperparathyroidism of renal origin: Secondary | ICD-10-CM | POA: Diagnosis not present

## 2017-07-27 DIAGNOSIS — D631 Anemia in chronic kidney disease: Secondary | ICD-10-CM | POA: Diagnosis not present

## 2017-07-29 DIAGNOSIS — D689 Coagulation defect, unspecified: Secondary | ICD-10-CM | POA: Diagnosis not present

## 2017-07-29 DIAGNOSIS — E876 Hypokalemia: Secondary | ICD-10-CM | POA: Diagnosis not present

## 2017-07-29 DIAGNOSIS — E119 Type 2 diabetes mellitus without complications: Secondary | ICD-10-CM | POA: Diagnosis not present

## 2017-07-29 DIAGNOSIS — N186 End stage renal disease: Secondary | ICD-10-CM | POA: Diagnosis not present

## 2017-07-29 DIAGNOSIS — D631 Anemia in chronic kidney disease: Secondary | ICD-10-CM | POA: Diagnosis not present

## 2017-07-29 DIAGNOSIS — N2581 Secondary hyperparathyroidism of renal origin: Secondary | ICD-10-CM | POA: Diagnosis not present

## 2017-08-01 DIAGNOSIS — N2581 Secondary hyperparathyroidism of renal origin: Secondary | ICD-10-CM | POA: Diagnosis not present

## 2017-08-01 DIAGNOSIS — Z7901 Long term (current) use of anticoagulants: Secondary | ICD-10-CM | POA: Diagnosis not present

## 2017-08-01 DIAGNOSIS — I4891 Unspecified atrial fibrillation: Secondary | ICD-10-CM | POA: Diagnosis not present

## 2017-08-01 DIAGNOSIS — N186 End stage renal disease: Secondary | ICD-10-CM | POA: Diagnosis not present

## 2017-08-01 DIAGNOSIS — D631 Anemia in chronic kidney disease: Secondary | ICD-10-CM | POA: Diagnosis not present

## 2017-08-01 DIAGNOSIS — E876 Hypokalemia: Secondary | ICD-10-CM | POA: Diagnosis not present

## 2017-08-01 DIAGNOSIS — E119 Type 2 diabetes mellitus without complications: Secondary | ICD-10-CM | POA: Diagnosis not present

## 2017-08-03 DIAGNOSIS — N2581 Secondary hyperparathyroidism of renal origin: Secondary | ICD-10-CM | POA: Diagnosis not present

## 2017-08-03 DIAGNOSIS — D631 Anemia in chronic kidney disease: Secondary | ICD-10-CM | POA: Diagnosis not present

## 2017-08-03 DIAGNOSIS — E119 Type 2 diabetes mellitus without complications: Secondary | ICD-10-CM | POA: Diagnosis not present

## 2017-08-03 DIAGNOSIS — E876 Hypokalemia: Secondary | ICD-10-CM | POA: Diagnosis not present

## 2017-08-03 DIAGNOSIS — N186 End stage renal disease: Secondary | ICD-10-CM | POA: Diagnosis not present

## 2017-08-04 DIAGNOSIS — I871 Compression of vein: Secondary | ICD-10-CM | POA: Diagnosis not present

## 2017-08-04 DIAGNOSIS — Z992 Dependence on renal dialysis: Secondary | ICD-10-CM | POA: Diagnosis not present

## 2017-08-04 DIAGNOSIS — T82858A Stenosis of vascular prosthetic devices, implants and grafts, initial encounter: Secondary | ICD-10-CM | POA: Diagnosis not present

## 2017-08-04 DIAGNOSIS — N186 End stage renal disease: Secondary | ICD-10-CM | POA: Diagnosis not present

## 2017-08-05 DIAGNOSIS — N2581 Secondary hyperparathyroidism of renal origin: Secondary | ICD-10-CM | POA: Diagnosis not present

## 2017-08-05 DIAGNOSIS — E876 Hypokalemia: Secondary | ICD-10-CM | POA: Diagnosis not present

## 2017-08-05 DIAGNOSIS — N186 End stage renal disease: Secondary | ICD-10-CM | POA: Diagnosis not present

## 2017-08-05 DIAGNOSIS — D631 Anemia in chronic kidney disease: Secondary | ICD-10-CM | POA: Diagnosis not present

## 2017-08-05 DIAGNOSIS — E119 Type 2 diabetes mellitus without complications: Secondary | ICD-10-CM | POA: Diagnosis not present

## 2017-08-08 ENCOUNTER — Other Ambulatory Visit: Payer: Self-pay

## 2017-08-08 ENCOUNTER — Inpatient Hospital Stay (HOSPITAL_COMMUNITY): Payer: Medicare Other

## 2017-08-08 ENCOUNTER — Encounter (HOSPITAL_COMMUNITY): Payer: Self-pay | Admitting: Emergency Medicine

## 2017-08-08 ENCOUNTER — Emergency Department (HOSPITAL_COMMUNITY): Payer: Medicare Other

## 2017-08-08 ENCOUNTER — Inpatient Hospital Stay (HOSPITAL_COMMUNITY)
Admission: EM | Admit: 2017-08-08 | Discharge: 2017-08-11 | DRG: 193 | Disposition: A | Discharge status: Home / Self Care | Payer: Medicare Other | Attending: Internal Medicine | Admitting: Internal Medicine

## 2017-08-08 DIAGNOSIS — I4891 Unspecified atrial fibrillation: Secondary | ICD-10-CM | POA: Diagnosis present

## 2017-08-08 DIAGNOSIS — E8770 Fluid overload, unspecified: Secondary | ICD-10-CM

## 2017-08-08 DIAGNOSIS — Y95 Nosocomial condition: Secondary | ICD-10-CM | POA: Diagnosis not present

## 2017-08-08 DIAGNOSIS — J9601 Acute respiratory failure with hypoxia: Secondary | ICD-10-CM | POA: Diagnosis present

## 2017-08-08 DIAGNOSIS — Z992 Dependence on renal dialysis: Secondary | ICD-10-CM | POA: Diagnosis not present

## 2017-08-08 DIAGNOSIS — N186 End stage renal disease: Secondary | ICD-10-CM | POA: Diagnosis present

## 2017-08-08 DIAGNOSIS — M2550 Pain in unspecified joint: Secondary | ICD-10-CM | POA: Diagnosis not present

## 2017-08-08 DIAGNOSIS — I252 Old myocardial infarction: Secondary | ICD-10-CM

## 2017-08-08 DIAGNOSIS — Z86718 Personal history of other venous thrombosis and embolism: Secondary | ICD-10-CM

## 2017-08-08 DIAGNOSIS — J810 Acute pulmonary edema: Secondary | ICD-10-CM

## 2017-08-08 DIAGNOSIS — D631 Anemia in chronic kidney disease: Secondary | ICD-10-CM | POA: Diagnosis not present

## 2017-08-08 DIAGNOSIS — K8010 Calculus of gallbladder with chronic cholecystitis without obstruction: Secondary | ICD-10-CM | POA: Diagnosis present

## 2017-08-08 DIAGNOSIS — Z841 Family history of disorders of kidney and ureter: Secondary | ICD-10-CM

## 2017-08-08 DIAGNOSIS — Z23 Encounter for immunization: Secondary | ICD-10-CM

## 2017-08-08 DIAGNOSIS — I132 Hypertensive heart and chronic kidney disease with heart failure and with stage 5 chronic kidney disease, or end stage renal disease: Secondary | ICD-10-CM | POA: Diagnosis present

## 2017-08-08 DIAGNOSIS — I2510 Atherosclerotic heart disease of native coronary artery without angina pectoris: Secondary | ICD-10-CM | POA: Diagnosis present

## 2017-08-08 DIAGNOSIS — Z8349 Family history of other endocrine, nutritional and metabolic diseases: Secondary | ICD-10-CM

## 2017-08-08 DIAGNOSIS — I5043 Acute on chronic combined systolic (congestive) and diastolic (congestive) heart failure: Secondary | ICD-10-CM

## 2017-08-08 DIAGNOSIS — J189 Pneumonia, unspecified organism: Secondary | ICD-10-CM | POA: Diagnosis not present

## 2017-08-08 DIAGNOSIS — Z993 Dependence on wheelchair: Secondary | ICD-10-CM

## 2017-08-08 DIAGNOSIS — N2581 Secondary hyperparathyroidism of renal origin: Secondary | ICD-10-CM | POA: Diagnosis present

## 2017-08-08 DIAGNOSIS — I248 Other forms of acute ischemic heart disease: Secondary | ICD-10-CM | POA: Diagnosis present

## 2017-08-08 DIAGNOSIS — E1142 Type 2 diabetes mellitus with diabetic polyneuropathy: Secondary | ICD-10-CM | POA: Diagnosis present

## 2017-08-08 DIAGNOSIS — R1084 Generalized abdominal pain: Secondary | ICD-10-CM | POA: Diagnosis not present

## 2017-08-08 DIAGNOSIS — R1011 Right upper quadrant pain: Secondary | ICD-10-CM | POA: Diagnosis not present

## 2017-08-08 DIAGNOSIS — Z794 Long term (current) use of insulin: Secondary | ICD-10-CM

## 2017-08-08 DIAGNOSIS — Z7982 Long term (current) use of aspirin: Secondary | ICD-10-CM

## 2017-08-08 DIAGNOSIS — N400 Enlarged prostate without lower urinary tract symptoms: Secondary | ICD-10-CM | POA: Diagnosis present

## 2017-08-08 DIAGNOSIS — R140 Abdominal distension (gaseous): Secondary | ICD-10-CM

## 2017-08-08 DIAGNOSIS — E785 Hyperlipidemia, unspecified: Secondary | ICD-10-CM | POA: Diagnosis present

## 2017-08-08 DIAGNOSIS — Z8249 Family history of ischemic heart disease and other diseases of the circulatory system: Secondary | ICD-10-CM

## 2017-08-08 DIAGNOSIS — I10 Essential (primary) hypertension: Secondary | ICD-10-CM | POA: Diagnosis not present

## 2017-08-08 DIAGNOSIS — R7989 Other specified abnormal findings of blood chemistry: Secondary | ICD-10-CM | POA: Diagnosis not present

## 2017-08-08 DIAGNOSIS — Z809 Family history of malignant neoplasm, unspecified: Secondary | ICD-10-CM

## 2017-08-08 DIAGNOSIS — I482 Chronic atrial fibrillation: Secondary | ICD-10-CM | POA: Diagnosis not present

## 2017-08-08 DIAGNOSIS — Z87442 Personal history of urinary calculi: Secondary | ICD-10-CM

## 2017-08-08 DIAGNOSIS — R945 Abnormal results of liver function studies: Secondary | ICD-10-CM | POA: Diagnosis not present

## 2017-08-08 DIAGNOSIS — I480 Paroxysmal atrial fibrillation: Secondary | ICD-10-CM | POA: Diagnosis present

## 2017-08-08 DIAGNOSIS — K8020 Calculus of gallbladder without cholecystitis without obstruction: Secondary | ICD-10-CM | POA: Diagnosis present

## 2017-08-08 DIAGNOSIS — I255 Ischemic cardiomyopathy: Secondary | ICD-10-CM | POA: Diagnosis present

## 2017-08-08 DIAGNOSIS — R52 Pain, unspecified: Secondary | ICD-10-CM | POA: Diagnosis not present

## 2017-08-08 DIAGNOSIS — E1122 Type 2 diabetes mellitus with diabetic chronic kidney disease: Secondary | ICD-10-CM | POA: Diagnosis present

## 2017-08-08 DIAGNOSIS — J3489 Other specified disorders of nose and nasal sinuses: Secondary | ICD-10-CM | POA: Diagnosis not present

## 2017-08-08 DIAGNOSIS — Z89511 Acquired absence of right leg below knee: Secondary | ICD-10-CM

## 2017-08-08 DIAGNOSIS — Z8546 Personal history of malignant neoplasm of prostate: Secondary | ICD-10-CM

## 2017-08-08 DIAGNOSIS — I120 Hypertensive chronic kidney disease with stage 5 chronic kidney disease or end stage renal disease: Secondary | ICD-10-CM | POA: Diagnosis not present

## 2017-08-08 DIAGNOSIS — I5042 Chronic combined systolic (congestive) and diastolic (congestive) heart failure: Secondary | ICD-10-CM | POA: Diagnosis present

## 2017-08-08 DIAGNOSIS — E1165 Type 2 diabetes mellitus with hyperglycemia: Secondary | ICD-10-CM

## 2017-08-08 DIAGNOSIS — E1129 Type 2 diabetes mellitus with other diabetic kidney complication: Secondary | ICD-10-CM | POA: Diagnosis not present

## 2017-08-08 DIAGNOSIS — Z7401 Bed confinement status: Secondary | ICD-10-CM | POA: Diagnosis not present

## 2017-08-08 DIAGNOSIS — Z89512 Acquired absence of left leg below knee: Secondary | ICD-10-CM

## 2017-08-08 DIAGNOSIS — J811 Chronic pulmonary edema: Secondary | ICD-10-CM | POA: Diagnosis present

## 2017-08-08 DIAGNOSIS — Z87891 Personal history of nicotine dependence: Secondary | ICD-10-CM

## 2017-08-08 DIAGNOSIS — Z951 Presence of aortocoronary bypass graft: Secondary | ICD-10-CM | POA: Diagnosis not present

## 2017-08-08 DIAGNOSIS — E1151 Type 2 diabetes mellitus with diabetic peripheral angiopathy without gangrene: Secondary | ICD-10-CM | POA: Diagnosis present

## 2017-08-08 DIAGNOSIS — R112 Nausea with vomiting, unspecified: Secondary | ICD-10-CM | POA: Diagnosis not present

## 2017-08-08 DIAGNOSIS — R404 Transient alteration of awareness: Secondary | ICD-10-CM | POA: Diagnosis not present

## 2017-08-08 DIAGNOSIS — K219 Gastro-esophageal reflux disease without esophagitis: Secondary | ICD-10-CM | POA: Diagnosis present

## 2017-08-08 DIAGNOSIS — Z820 Family history of epilepsy and other diseases of the nervous system: Secondary | ICD-10-CM

## 2017-08-08 DIAGNOSIS — Z86010 Personal history of colonic polyps: Secondary | ICD-10-CM | POA: Diagnosis not present

## 2017-08-08 DIAGNOSIS — Z7901 Long term (current) use of anticoagulants: Secondary | ICD-10-CM

## 2017-08-08 DIAGNOSIS — R05 Cough: Secondary | ICD-10-CM | POA: Diagnosis not present

## 2017-08-08 DIAGNOSIS — R0902 Hypoxemia: Secondary | ICD-10-CM | POA: Diagnosis not present

## 2017-08-08 DIAGNOSIS — Z885 Allergy status to narcotic agent status: Secondary | ICD-10-CM

## 2017-08-08 DIAGNOSIS — K5900 Constipation, unspecified: Secondary | ICD-10-CM | POA: Diagnosis present

## 2017-08-08 DIAGNOSIS — N189 Chronic kidney disease, unspecified: Secondary | ICD-10-CM

## 2017-08-08 DIAGNOSIS — IMO0002 Reserved for concepts with insufficient information to code with codable children: Secondary | ICD-10-CM | POA: Diagnosis present

## 2017-08-08 DIAGNOSIS — R1010 Upper abdominal pain, unspecified: Secondary | ICD-10-CM | POA: Diagnosis present

## 2017-08-08 LAB — COMPREHENSIVE METABOLIC PANEL
ALK PHOS: 143 U/L — AB (ref 38–126)
ALT: 189 U/L — ABNORMAL HIGH (ref 0–44)
ANION GAP: 17 — AB (ref 5–15)
AST: 201 U/L — ABNORMAL HIGH (ref 15–41)
Albumin: 3.8 g/dL (ref 3.5–5.0)
BILIRUBIN TOTAL: 1.3 mg/dL — AB (ref 0.3–1.2)
BUN: 30 mg/dL — ABNORMAL HIGH (ref 8–23)
CALCIUM: 8.8 mg/dL — AB (ref 8.9–10.3)
CO2: 26 mmol/L (ref 22–32)
Chloride: 98 mmol/L (ref 98–111)
Creatinine, Ser: 7.78 mg/dL — ABNORMAL HIGH (ref 0.61–1.24)
GFR, EST AFRICAN AMERICAN: 7 mL/min — AB (ref >60)
GFR, EST NON AFRICAN AMERICAN: 6 mL/min — AB (ref >60)
GLUCOSE: 182 mg/dL — AB (ref 70–99)
Potassium: 4.8 mmol/L (ref 3.5–5.1)
Sodium: 141 mmol/L (ref 135–145)
TOTAL PROTEIN: 7.3 g/dL (ref 6.5–8.1)

## 2017-08-08 LAB — CBC
HCT: 41.7 % (ref 39.0–52.0)
HEMOGLOBIN: 12.5 g/dL — AB (ref 13.0–17.0)
MCH: 31.6 pg (ref 26.0–34.0)
MCHC: 30 g/dL (ref 30.0–36.0)
MCV: 105.3 fL — ABNORMAL HIGH (ref 78.0–100.0)
Platelets: 113 10*3/uL — ABNORMAL LOW (ref 150–400)
RBC: 3.96 MIL/uL — AB (ref 4.22–5.81)
RDW: 16 % — AB (ref 11.5–15.5)
WBC: 7.4 10*3/uL (ref 4.0–10.5)

## 2017-08-08 LAB — GLUCOSE, CAPILLARY: GLUCOSE-CAPILLARY: 154 mg/dL — AB (ref 70–99)

## 2017-08-08 LAB — PROTIME-INR
INR: 1.73
Prothrombin Time: 20.1 seconds — ABNORMAL HIGH (ref 11.4–15.2)

## 2017-08-08 LAB — LIPASE, BLOOD: Lipase: 41 U/L (ref 11–51)

## 2017-08-08 LAB — TROPONIN I: Troponin I: 0.12 ng/mL (ref <0.03)

## 2017-08-08 MED ORDER — SODIUM CHLORIDE 0.9 % IV SOLN: 250.0000 mL | INTRAVENOUS | Status: DC | PRN

## 2017-08-08 MED ORDER — PRAVASTATIN SODIUM 40 MG PO TABS
40.0000 mg | ORAL_TABLET | ORAL | Status: DC
  Administered 2017-08-09 – 2017-08-11 (×2): 40 mg via ORAL
  Filled 2017-08-08 (×2): qty 1

## 2017-08-08 MED ORDER — ASPIRIN EC 81 MG PO TBEC
81.0000 mg | DELAYED_RELEASE_TABLET | Freq: Every day | ORAL | Status: DC
  Administered 2017-08-09 – 2017-08-11 (×3): 81 mg via ORAL
  Filled 2017-08-08 (×3): qty 1

## 2017-08-08 MED ORDER — ENSURE ENLIVE PO LIQD
237.0000 mL | Freq: Two times a day (BID) | ORAL | Status: DC
  Administered 2017-08-09 (×2): 237 mL via ORAL

## 2017-08-08 MED ORDER — AMLODIPINE BESYLATE 5 MG PO TABS: 5.0000 mg | ORAL_TABLET | Freq: Every day | ORAL | Status: DC

## 2017-08-08 MED ORDER — INSULIN ASPART 100 UNIT/ML ~~LOC~~ SOLN: 0.0000 [IU] | Freq: Every day | SUBCUTANEOUS | Status: DC

## 2017-08-08 MED ORDER — CARVEDILOL 6.25 MG PO TABS
6.2500 mg | ORAL_TABLET | Freq: Two times a day (BID) | ORAL | Status: DC
  Administered 2017-08-09 – 2017-08-11 (×3): 6.25 mg via ORAL
  Filled 2017-08-08 (×4): qty 1

## 2017-08-08 MED ORDER — CALCIUM ACETATE (PHOS BINDER) 667 MG PO CAPS
667.0000 mg | ORAL_CAPSULE | Freq: Every day | ORAL | Status: DC
  Administered 2017-08-09 – 2017-08-11 (×3): 667 mg via ORAL
  Filled 2017-08-08 (×3): qty 1

## 2017-08-08 MED ORDER — POLYETHYLENE GLYCOL 3350 17 G PO PACK
17.0000 g | PACK | Freq: Every day | ORAL | Status: DC
  Administered 2017-08-09 – 2017-08-10 (×2): 17 g via ORAL
  Filled 2017-08-08 (×3): qty 1

## 2017-08-08 MED ORDER — HYDRALAZINE HCL 25 MG PO TABS
25.0000 mg | ORAL_TABLET | Freq: Three times a day (TID) | ORAL | Status: DC
  Administered 2017-08-08 – 2017-08-10 (×5): 25 mg via ORAL
  Filled 2017-08-08 (×5): qty 1

## 2017-08-08 MED ORDER — ACETAMINOPHEN 325 MG PO TABS: 650.0000 mg | ORAL_TABLET | Freq: Four times a day (QID) | ORAL | Status: DC | PRN

## 2017-08-08 MED ORDER — HYDROCODONE-ACETAMINOPHEN 5-325 MG PO TABS: 1.0000 | ORAL_TABLET | ORAL | Status: DC | PRN

## 2017-08-08 MED ORDER — ISOSORBIDE MONONITRATE ER 30 MG PO TB24
30.0000 mg | ORAL_TABLET | Freq: Every day | ORAL | Status: DC
  Administered 2017-08-09 – 2017-08-11 (×3): 30 mg via ORAL
  Filled 2017-08-08 (×3): qty 1

## 2017-08-08 MED ORDER — PNEUMOCOCCAL VAC POLYVALENT 25 MCG/0.5ML IJ INJ
0.5000 mL | INJECTION | INTRAMUSCULAR | Status: AC
  Administered 2017-08-09: 0.5 mL via INTRAMUSCULAR
  Filled 2017-08-08: qty 0.5

## 2017-08-08 MED ORDER — SODIUM CHLORIDE 0.9 % IV SOLN: 1.0000 g | Freq: Three times a day (TID) | INTRAVENOUS | Status: DC

## 2017-08-08 MED ORDER — SODIUM CHLORIDE 0.9% FLUSH
3.0000 mL | Freq: Two times a day (BID) | INTRAVENOUS | Status: DC
  Administered 2017-08-08 – 2017-08-09 (×2): 3 mL via INTRAVENOUS

## 2017-08-08 MED ORDER — INSULIN DETEMIR 100 UNIT/ML ~~LOC~~ SOLN
13.0000 [IU] | Freq: Every day | SUBCUTANEOUS | Status: DC
  Administered 2017-08-09 – 2017-08-11 (×3): 13 [IU] via SUBCUTANEOUS
  Filled 2017-08-08 (×3): qty 0.13

## 2017-08-08 MED ORDER — FERRIC CITRATE 1 GM 210 MG(FE) PO TABS
420.0000 mg | ORAL_TABLET | Freq: Three times a day (TID) | ORAL | Status: DC
  Administered 2017-08-09 – 2017-08-11 (×6): 420 mg via ORAL
  Filled 2017-08-08 (×8): qty 2

## 2017-08-08 MED ORDER — SODIUM CHLORIDE 0.9 % IV SOLN
1000.0000 mg | Freq: Once | INTRAVENOUS | Status: AC
  Administered 2017-08-08: 1000 mg via INTRAVENOUS
  Filled 2017-08-08: qty 1

## 2017-08-08 MED ORDER — INSULIN ASPART 100 UNIT/ML ~~LOC~~ SOLN
0.0000 [IU] | Freq: Three times a day (TID) | SUBCUTANEOUS | Status: DC
  Administered 2017-08-09: 2 [IU] via SUBCUTANEOUS
  Administered 2017-08-09 – 2017-08-10 (×2): 3 [IU] via SUBCUTANEOUS
  Administered 2017-08-11: 1 [IU] via SUBCUTANEOUS

## 2017-08-08 MED ORDER — PREGABALIN 75 MG PO CAPS
75.0000 mg | ORAL_CAPSULE | Freq: Two times a day (BID) | ORAL | Status: DC
  Administered 2017-08-08 – 2017-08-11 (×6): 75 mg via ORAL
  Filled 2017-08-08 (×6): qty 1

## 2017-08-08 MED ORDER — ONDANSETRON HCL 4 MG PO TABS: 4.0000 mg | ORAL_TABLET | Freq: Four times a day (QID) | ORAL | Status: DC | PRN

## 2017-08-08 MED ORDER — VANCOMYCIN HCL 10 G IV SOLR
1500.0000 mg | Freq: Once | INTRAVENOUS | Status: AC
  Administered 2017-08-08: 1500 mg via INTRAVENOUS
  Filled 2017-08-08: qty 1500

## 2017-08-08 MED ORDER — ACETAMINOPHEN 650 MG RE SUPP: 650.0000 mg | Freq: Four times a day (QID) | RECTAL | Status: DC | PRN

## 2017-08-08 MED ORDER — WARFARIN SODIUM 10 MG PO TABS
10.0000 mg | ORAL_TABLET | ORAL | Status: AC
  Administered 2017-08-08: 10 mg via ORAL
  Filled 2017-08-08: qty 1

## 2017-08-08 MED ORDER — ONDANSETRON HCL 4 MG/2ML IJ SOLN: 4.0000 mg | Freq: Four times a day (QID) | INTRAMUSCULAR | Status: DC | PRN

## 2017-08-08 MED ORDER — VANCOMYCIN HCL IN DEXTROSE 750-5 MG/150ML-% IV SOLN: 750.0000 mg | INTRAVENOUS | Status: DC

## 2017-08-08 MED ORDER — VANCOMYCIN HCL IN DEXTROSE 1-5 GM/200ML-% IV SOLN: 1000.0000 mg | Freq: Once | INTRAVENOUS | Status: DC

## 2017-08-08 MED ORDER — SODIUM CHLORIDE 0.9 % IV SOLN
1.0000 g | INTRAVENOUS | Status: DC
  Filled 2017-08-08: qty 1

## 2017-08-08 MED ORDER — SODIUM CHLORIDE 0.9% FLUSH: 3.0000 mL | INTRAVENOUS | Status: DC | PRN

## 2017-08-08 MED ORDER — WARFARIN - PHARMACIST DOSING INPATIENT: Freq: Every day | Status: DC

## 2017-08-08 NOTE — Progress Notes (Signed)
Troponin .12. Probably demand ischemia secondary to renal failure. No chest pain or other sx of ACS. EKG without ischemia in ED. Will continue to follow enzymes.  KJKG, NP [facility]

## 2017-08-08 NOTE — ED Provider Notes (Signed)
[facility] EMERGENCY DEPARTMENT Provider Note   CSN: [id number] Arrival date & time: 08/08/17  1129     History   Chief Complaint Chief Complaint  Patient presents with  . Abdominal Pain    HPI Aaron Long is a 77 y.o. male.  HPI   Aaron Long is a 77yo male with a history of type 2 diabetes, ESRD (HD M/W/F), hypertension, CAD, PAD, bilateral BKA, CHF (EF 35 to 40%) who presents to the emergency department for evaluation of abdominal distention.  Patient reports that he woke up this morning feeling unwell, as if his abdomen is "tight."  He denies abdominal pain.  States that he has had a cold with a nonproductive cough and runny nose for 2 weeks.  He missed his dialysis today given he was not feeling well.  He had one episode of nonbloody, nonbilious emesis.  Denies diarrhea, states that he had a normal bowel movement yesterday evening.  His wife at the bedside reports that his urine appeared dark and was malodorous.  He has had decreased p.o. intake given his symptoms.  He denies fevers, chills, chest pain, shortness of breath, wheezing, lightheadedness, syncope, numbness, weakness, visual disturbance, numbness, weakness, headache. Patient reports that he has never required home O2.  His wife spoke with me outside of the room stating that he is not being completely honest about his symptoms and does not want to be admitted to the hospital.  She states that he has been moaning all day long holding his abdomen in pain and has been very uncomfortable. She states that he is having a worsening cough for two weeks.    Past Medical History:  Diagnosis Date  . Abnormality of gait   . Anemia   . BPH (benign prostatic hypertrophy)   . Chronic combined systolic and diastolic CHF (congestive heart failure) ([place])   . Complication of anesthesia    "he gets delirious"  . Constipation    takes Miralax daily  . Coronary artery disease    a. s/p NSTEMI/CABGx4 in 2007 -  LIMA-LAD, SVG-optional diagonal, SVVG-OM, SVG-dRCA. b. Nuc 05/2011: nonischemic. c. all bypass grafts widely patent, 80% stenosis involving the grafted OM proximal to SVG anastomosis, which backfills the distal LCx, moderately elevated LVEDP, EF 25%.   . Diabetic peripheral neuropathy ([place])   . Dieulafoy lesion of rectum 05/03/2015  . Disorder of bone and cartilage, unspecified   . DVT (deep venous thrombosis) ([place])    a. Lower extremity DVT in 2013.  [name] ESRD on dialysis [facility])    M-W-F  . Gangrene of toe (HCC)    dry  . Hemorrhoids   . Hx of colonic polyps   . Hyperlipidemia   . Hypertension   . Hypertrophy of prostate without urinary obstruction and other lower urinary tract symptoms (LUTS)   . Ischemic cardiomyopathy    a. EF 40% in 2007. b. 51% by nuc 05/2011. c. EF 25% by cath and 35-40% by echo in 2018.  [name] Kidney stones   . Lower limb amputation, below knee   . Myocardial infarction ([place]) 2005  . Neuromuscular disorder ([place])    diabetic neuropathy  . NSTEMI (non-ST elevated myocardial infarction) ([place]) 04/2016  . PAD (peripheral artery disease) ([place])    a. R CEA 2007. b. Hx L BKA in 2013. c. s/p LE angioplasty in 2014. d. Hx R BKA in 2014.  [name] PAF (paroxysmal atrial fibrillation) ([place])    a. dx 06/2016.  [name]  Peripheral neuropathy   . Prostate cancer ([place]) 2010  . Secondary hyperparathyroidism ([place])    Secondary Hyperpara- Thyroidism, Renal  . Type II diabetes mellitus ([place])     Patient Active Problem List   Diagnosis Date Noted  . Upper abdominal pain 06/30/2017  . New onset atrial fibrillation ([place]) 06/30/2016  . Atrial fibrillation ([place]) 06/29/2016  . Elevated troponin 04/10/2016  . Acute on chronic systolic and diastolic heart failure, NYHA class 4 ([place])   . Pulmonary edema   . NSTEMI (non-ST elevated myocardial infarction) ([place])   . CHF (congestive heart failure) ([place]) 04/09/2016  . Hereditary and idiopathic peripheral neuropathy 06/26/2015  . History of pressure ulcer  06/26/2015  . Dieulafoy lesion of rectum 05/03/2015  . Rectal bleeding 05/01/2015  . Acute blood loss anemia 05/01/2015  . Numbness and tingling in right hand 11/05/2014  . Right hand pain 11/05/2014  . Carotid stenosis   . TIA (transient ischemic attack) 09/30/2014  . ESRD on dialysis ([place])   . Anemia of chronic disease 05/02/2014  . Insomnia due to stress 05/02/2014  . Physical deconditioning 04/25/2014  . Syncope 04/17/2014  . Chest pain 04/08/2014  . Status post bilateral below knee amputation ([place]) 04/02/2014  . Cervical disc disorder with radiculopathy of cervical region 08/07/2013  . Drug-induced constipation 06/13/2013  . DM type 2, uncontrolled, with renal complications ([place]) 32/67/1245  . Hypertensive renal disease 06/13/2013  . Hyperlipidemia LDL goal <70 12/06/2012  . Diabetes mellitus with peripheral vascular disease ([place]) 09/06/2012  . GERD (gastroesophageal reflux disease) 09/06/2012  . Anemia in chronic renal disease 07/24/2012  . Atherosclerosis of native arteries of the extremities with ulceration(440.23) 06/01/2012  . Hyperkalemia 04/24/2012  . HTN (hypertension) 04/28/2011  . CAD (coronary artery disease) 04/28/2011  . PAD (peripheral artery disease) ([place]) 04/28/2011  . Prostate cancer ([place]) 04/28/2011    Past Surgical History:  Procedure Laterality Date  . ABDOMINAL AORTAGRAM N/A 05/28/2011   Procedure: ABDOMINAL Maxcine Ham;  Surgeon: Elam Dutch, MD;  Location: [facility] CATH LAB;  Service: Cardiovascular;  Laterality: N/A;  . ABDOMINAL AORTAGRAM N/A 06/02/2012   Procedure: ABDOMINAL Maxcine Ham;  Surgeon: Elam Dutch, MD;  Location: [facility] CATH LAB;  Service: Cardiovascular;  Laterality: N/A;  . ABDOMINAL AORTAGRAM N/A 06/09/2012   Procedure: ABDOMINAL Maxcine Ham;  Surgeon: Elam Dutch, MD;  Location: [facility] CATH LAB;  Service: Cardiovascular;  Laterality: N/A;  . AMPUTATION  06/14/2011   Procedure: AMPUTATION DIGIT;  Surgeon: Elam Dutch, MD;  Location: [facility]  OR;  Service: Vascular;  Laterality: Left;  Amputation Left fifth toe  . AMPUTATION  06/16/2011   Procedure: AMPUTATION BELOW KNEE;  Surgeon: Elam Dutch, MD;  Location: [place];  Service: Vascular;  Laterality: Left;  . AMPUTATION Right 06/12/2012   Procedure: AMPUTATION DIGIT;  Surgeon: Elam Dutch, MD;  Location: [place];  Service: Vascular;  Laterality: Right;  GREAT TOE  . AMPUTATION Right 08/08/2012   Procedure: AMPUTATION BELOW KNEE;  Surgeon: Elam Dutch, MD;  Location: [place];  Service: Vascular;  Laterality: Right;  . AV FISTULA PLACEMENT Right 04/19/2014   Procedure: RIGHT ARM ARTERIOVENOUS (AV) FISTULA CREATION;  Surgeon: Mal Misty, MD;  Location: [place];  Service: Vascular;  Laterality: Right;  . AV FISTULA PLACEMENT Left 12/12/2014   Procedure: BRACHIOCEPHALIC ARTERIOVENOUS (AV) FISTULA CREATION;  Surgeon: Mal Misty, MD;  Location: [facility];  Service: Vascular;  Laterality: Left;  . CARDIAC CATHETERIZATION  05/28/11  . CARDIAC CATHETERIZATION N/A  10/04/2014   Procedure: Left Heart Cath and Coronary Angiography;  Surgeon: Sherren Mocha, MD;  Location: [facility] CV LAB;  Service: Cardiovascular;  Laterality: N/A;  . CAROTID ENDARTERECTOMY Right 2005  . CATARACT EXTRACTION W/ INTRAOCULAR LENS  IMPLANT, BILATERAL Bilateral 2011  . COLONOSCOPY    . COLONOSCOPY N/A 05/03/2015   Procedure: COLONOSCOPY;  Surgeon: Gatha Mayer, MD;  Location: [facility];  Service: Endoscopy;  Laterality: N/A;  . CORONARY ARTERY BYPASS GRAFT  2005   CABG X4  . ENDARTERECTOMY Left 10/17/2014   Procedure: ENDARTERECTOMY CAROTID;  Surgeon: Mal Misty, MD;  Location: [facility];  Service: Vascular;  Laterality: Left;  . I&D EXTREMITY  01/31/2012   Procedure: IRRIGATION AND DEBRIDEMENT EXTREMITY;  Surgeon: Elam Dutch, MD;  Location: [place];  Service: Vascular;  Laterality: Left;  I & D Left BKA   . INSERTION OF DIALYSIS CATHETER Right 04/19/2014   Procedure: INSERTION OF DIALYSIS  CATHETER-INTERNAL JUGULAR;  Surgeon: Mal Misty, MD;  Location: [facility] [place];  Service: Vascular;  Laterality: Right;  . INSERTION OF DIALYSIS CATHETER N/A 11/06/2014   Procedure: INSERTION OF 23cm DIALYSIS CATHETER - right internal jugular;  Surgeon: Mal Misty, MD;  Location: [place] [facility];  Service: Vascular;  Laterality: N/A;  . KNEE CARTILAGE SURGERY Left 1964  . LEFT HEART CATH AND CORONARY ANGIOGRAPHY N/A 04/12/2016   Procedure: Left Heart Cath and Coronary Angiography;  Surgeon: Nelva Bush, MD;  Location: [facility] CV LAB;  Service: Cardiovascular;  Laterality: N/A;  . LIGATION OF ARTERIOVENOUS  FISTULA Right 11/06/2014   Procedure: LIGATION OF RADIOCEPHALIC ARTERIOVENOUS FISTULA;  Surgeon: Mal Misty, MD;  Location: [place];  Service: Vascular;  Laterality: Right;  . LIGATION OF COMPETING BRANCHES OF ARTERIOVENOUS FISTULA Right 06/19/2014   Procedure: Right Arm LIGATION OF COMPETING BRANCHES OF RADIOCEPHALIC ARTERIOVENOUS FISTULA;  Surgeon: Mal Misty, MD;  Location: [place];  Service: Vascular;  Laterality: Right;  . PROSTATECTOMY  2009        Home Medications    Prior to Admission medications   Medication Sig Start Date End Date Taking? Authorizing Provider  amLODipine (NORVASC) 5 MG tablet Take 1 tablet (5 mg total) by mouth daily. Take one tablet by mouth once daily 04/13/16   Rai, Vernelle Emerald, MD  aspirin EC 81 MG tablet Take 81 mg by mouth daily.    [provider]  AURYXIA 1 GM 210 MG(Fe) tablet Take 420 mg by mouth 3 (three) times daily. 03/18/16   [provider]  calcium acetate (PHOSLO) 667 MG capsule Take 667 mg by mouth daily. 08/27/15   [provider]  carvedilol (COREG) 6.25 MG tablet Take 1 tablet (6.25 mg total) by mouth 2 (two) times daily with a meal. 07/06/16   [name], MD  hydrALAZINE (APRESOLINE) 25 MG tablet Take 1 tablet (25 mg total) by mouth every 8 (eight) hours. 04/13/16   Rai, Vernelle Emerald, MD  isosorbide mononitrate (IMDUR) 30 MG  24 hr tablet Take 1 tablet (30 mg total) by mouth daily. 04/14/16   Rai, Ripudeep K, MD  LEVEMIR FLEXTOUCH 100 UNIT/ML Pen Inject 13 Units as directed daily.  04/05/16   [provider]  NOVOLOG FLEXPEN 100 UNIT/ML FlexPen CBG 70 - 120: 0 units  CBG 121 - 150: 1 unit  CBG 151 - 200: 2 units  CBG 201 - 250: 3 units  CBG 251 - 300: 5 units  CBG 301 - 350: 7 units  CBG  351 - 400: 9 units 04/06/16   [provider]  polyethylene glycol (MIRALAX) packet Take 17 g by mouth daily. 04/05/16   Estill Dooms, MD  pravastatin (PRAVACHOL) 40 MG tablet Take 40 mg by mouth daily.    [provider]  pregabalin (LYRICA) 75 MG capsule Take 75 mg by mouth as needed.     [provider]  QUEtiapine (SEROQUEL) 25 MG tablet Take 1 tablet (25 mg total) by mouth daily. Patient taking differently: Take 25 mg by mouth as needed.  04/05/16   Estill Dooms, MD  warfarin (COUMADIN) 7.5 MG tablet Take 1 tablet (7.5 mg total) by mouth daily at 6 PM. Patient taking differently: Take 7.5 mg by mouth See admin instructions. 7.5 mg every evening, EXCEPT Sunday take 11.25 mg 07/06/16   [name], MD    Family History Family History  Problem Relation Age of Onset  . Hyperlipidemia Mother   . Hypertension Mother   . Cancer Mother   . Hyperlipidemia Father   . Hypertension Father   . Kidney disease Father   . Heart disease Sister   . Alzheimer's disease Sister   . Cancer Brother   . Other Sister   . Coronary artery disease Neg Hx   . Anesthesia problems Neg Hx   . Hypotension Neg Hx   . Malignant hyperthermia Neg Hx   . Pseudochol deficiency Neg Hx     Social History Social History   Tobacco Use  . Smoking status: Former Smoker    Packs/day: 1.00    Years: 30.00    Pack years: 30.00    Types: Cigarettes    Last attempt to quit: 04/28/1991    Years since quitting: 26.2  . Smokeless tobacco: Never Used  Substance Use Topics  . Alcohol use: Yes    Comment: 04/17/2014  "might have a glass of wine at Christmas"  . Drug use: No     Allergies   Oxycodone   Review of Systems Review of Systems  Constitutional: Negative for chills and fever.  HENT: Positive for rhinorrhea. Negative for sinus pressure and sinus pain.   Eyes: Negative for visual disturbance.  Respiratory: Positive for cough. Negative for shortness of breath and wheezing.   Cardiovascular: Negative for chest pain and leg swelling.  Gastrointestinal: Positive for abdominal distention, nausea and vomiting. Negative for abdominal pain, blood in stool and diarrhea.  Genitourinary: Negative for difficulty urinating, dysuria, frequency, scrotal swelling and testicular pain.  Musculoskeletal: Negative for back pain and gait problem.  Skin: Negative for color change.  Neurological: Negative for weakness, light-headedness, numbness and headaches.  Psychiatric/Behavioral: Negative for agitation.     Physical Exam Updated Vital Signs BP (!) 159/78   Pulse 82   Temp 98.5 F (36.9 C) (Oral)   Resp 19   SpO2 94%   Physical Exam  Constitutional: He is oriented to person, place, and time. He appears well-developed and well-nourished. No distress.  Non-toxic appearing and NAD.   HENT:  Head: Normocephalic and atraumatic.  Eyes: Conjunctivae are normal. Right eye exhibits no discharge. Left eye exhibits no discharge.  Right pupil 41m, left pupil 336m Pupils are reactive to light bilaterally.   Neck: Normal range of motion.  Pulmonary/Chest: Effort normal. No respiratory distress.  Midline sternal scar. No respiratory distress, speaking in full sentences. Faint crackles auscultated in bilateral lower lung fields. No rhonchi or wheezes.   Abdominal:  Abdomen soft and non-distended. Tender to palpation in epigastrium  and RUQ. No guarding or rigidity. Negative Murphy's sign. No CVA tenderness.   Musculoskeletal: Normal range of motion.  Bilateral BKA. No appreciable swelling in the bilateral  thighs.   Neurological: He is alert and oriented to person, place, and time. Coordination normal.  Skin: Skin is warm and dry. He is not diaphoretic.  Psychiatric: He has a normal mood and affect. His behavior is normal.  Nursing note and vitals reviewed.    ED Treatments / Results  Labs (all labs ordered are listed, but only abnormal results are displayed) Labs Reviewed  COMPREHENSIVE METABOLIC PANEL - Abnormal; Notable for the following components:      Result Value   Glucose, Bld 182 (*)    BUN 30 (*)    Creatinine, Ser 7.78 (*)    Calcium 8.8 (*)    AST 201 (*)    ALT 189 (*)    Alkaline Phosphatase 143 (*)    Total Bilirubin 1.3 (*)    GFR calc non Af Amer 6 (*)    GFR calc Af Amer 7 (*)    Anion gap 17 (*)    All other components within normal limits  CBC - Abnormal; Notable for the following components:   RBC 3.96 (*)    Hemoglobin 12.5 (*)    MCV 105.3 (*)    RDW 16.0 (*)    Platelets 113 (*)    All other components within normal limits  LIPASE, BLOOD  URINALYSIS, ROUTINE W REFLEX MICROSCOPIC  BRAIN NATRIURETIC PEPTIDE    EKG EKG Interpretation  Date/Time:  Monday August 08 2017 11:37:56 EDT Ventricular Rate:  91 PR Interval:  174 QRS Duration: 102 QT Interval:  392 QTC Calculation: 482 R Axis:   -7 Text Interpretation:  Normal sinus rhythm ST & T wave abnormality, consider lateral ischemia Prolonged QT Abnormal ECG similar to previous  tracing from 06/2017 Confirmed by [name] [phone]) on 08/08/2017 3:41:54 PM   Radiology Dg Chest 2 View  Result Date: 08/08/2017 CLINICAL DATA:  Two week history of cough. EXAM: CHEST - 2 VIEW COMPARISON:  Chest x-rays dated 06/30/2017 in 06/28/2016. FINDINGS: Stable cardiomegaly. Median sternotomy wires are stable in alignment. Atherosclerotic changes again noted at the aortic arch. Coarse interstitial markings again noted bilaterally suggesting some degree of chronic interstitial lung disease, although increased  compared to previous studies suggesting superimposed edema. New dense opacity at the posterior costophrenic angle, uncertain side as it is only seen on the lateral view, pneumonia versus atelectasis. No pleural effusion or pneumothorax seen. IMPRESSION: 1. New dense opacity at the posterior costophrenic angle, pneumonia versus atelectasis, suspicious for pneumonia given the history of productive cough. 2. Probable bilateral interstitial edema, indicating mild CHF/volume overload, superimposed on chronic interstitial lung disease. 3. Stable cardiomegaly. 4. Aortic atherosclerosis. Electronically Signed   By: Franki Cabot M.D.   On: 08/08/2017 15:12   US Abdomen Limited Ruq  Result Date: 08/08/2017 CLINICAL DATA:  Elevated LFTs. EXAM: ULTRASOUND ABDOMEN LIMITED RIGHT UPPER QUADRANT COMPARISON:  06/29/2017. FINDINGS: Gallbladder: Small gallstones are present, with the largest measuring 6 mm in single dimension. Gallbladder wall thickening, upper limits of normal, mildly increased at 3.3 mm. No pericholecystic fluid. Negative sonographic Murphy's sign. Common bile duct: Diameter: 3 mm, normal. Liver: No focal lesion identified. Within normal limits in parenchymal echogenicity. Portal vein is patent on color Doppler imaging with normal direction of blood flow towards the liver. IMPRESSION: Cholelithiasis, without gallbladder wall tenderness, but with stable mild gallbladder wall thickening of  3 mm. No biliary ductal dilatation to suggest an obstructive cause of LFTs. If further investigation desired, consider nuclear medicine hepatobiliary imaging. Electronically Signed   By: Staci Righter M.D.   On: 08/08/2017 16:28    Procedures Procedures (including critical care time)  Medications Ordered in ED Medications - No data to display   Initial Impression / Assessment and Plan / ED Course  I have reviewed the triage vital signs and the nursing notes.  Pertinent labs & imaging results that were available  during my care of the patient were reviewed by me and considered in my medical decision making (see chart for details).     Patient presents with abdominal tightness, cough and malaise over the past week. According to nursing staff his pulse ox dropped to 80% on room air and he had to be put on 2L O2 [place] initially. No history of home oxygen. CXR shows new dense opacity at the posterior costophrenic angle, suspicious for pneumonia. Likely cause of his shortness of breath given he also reports recent cough symptoms. EKG without acute ischemic changes and I dout ACS. Lab work without leukocytosis and he is afebrile. In terms of his abdominal tightness, exam without significant tenderness. No peritoneal signs. His LFTS are elevated (AST 201, ALT 189 and Alk Phos 143.)  RUQ ultrasound reveals cholelithiasis with stable mild gallbladder wall thickening of 37m, no biliary ductal dilatation to suggest an obstructive cause of LFTs. Otherwise lipase negative. On recheck, patient's pulse ox is 94% on room air. Pharmacy consulted to discuss antibiotic treatment of CAP. Sign out given at shift change to [facility] Laniusfor disposition. Anticipate discharge home if patient continues to not require O2 in the ED. This was a shared visit with Dr. WEulis Fosterwho also saw the patient and agrees with the plan.    Final Clinical Impressions(s) / ED Diagnoses   Final diagnoses:  Elevated LFTs    ED Discharge Orders    None       SBernarda Caffey07/30/19 [id number]   WDaleen Bo MD 08/10/17 [phone]

## 2017-08-08 NOTE — Progress Notes (Signed)
Patient transferred to floor, no complaints of pain, but patient states his stomach felt "tight". Patient updated on plan of care, safety precautions in place.  Cardiac monitoring initiated and verified.

## 2017-08-08 NOTE — ED Notes (Signed)
Patient transported to Ultrasound 

## 2017-08-08 NOTE — Progress Notes (Signed)
Troponin 0.12, NP paged. Patient asymptomatic.

## 2017-08-08 NOTE — Progress Notes (Signed)
ANTICOAGULATION CONSULT NOTE - Initial Consult  Pharmacy Consult for warfarin Indication: atrial fibrillation  Allergies  Allergen Reactions  . Oxycodone Other (See Comments)    Hallucinations    Patient Measurements: Height: 5\' 8"  (172.7 cm) Weight: 159 lb 9.8 oz (72.4 kg) IBW/kg (Calculated) : 68.4  Vital Signs: Temp: 98.3 F (36.8 C) (07/29 2152) Temp Source: Oral (07/29 2152) BP: 149/80 (07/29 2152) Pulse Rate: 82 (07/29 2152)  Labs: Recent Labs    08/08/17 1143 08/08/17 2025  HGB 12.5*  --   HCT 41.7  --   PLT 113*  --   LABPROT  --  20.1*  INR  --  1.73  CREATININE 7.78*  --     Estimated Creatinine Clearance: 7.7 mL/min (A) (by C-G formula based on SCr of 7.78 mg/dL (H)).   Medical History: Past Medical History:  Diagnosis Date  . Abnormality of gait   . Anemia   . BPH (benign prostatic hypertrophy)   . Chronic combined systolic and diastolic CHF (congestive heart failure) ([place])   . Complication of anesthesia    "he gets delirious"  . Constipation    takes Miralax daily  . Coronary artery disease    a. s/p NSTEMI/CABGx4 in 2007 - LIMA-LAD, SVG-optional diagonal, SVVG-OM, SVG-dRCA. b. Nuc 05/2011: nonischemic. c. all bypass grafts widely patent, 80% stenosis involving the grafted OM proximal to SVG anastomosis, which backfills the distal LCx, moderately elevated LVEDP, EF 25%.   . Diabetic peripheral neuropathy ([place])   . Dieulafoy lesion of rectum 05/03/2015  . Disorder of bone and cartilage, unspecified   . DVT (deep venous thrombosis) ([place])    a. Lower extremity DVT in 2013.  [name] ESRD on dialysis [facility])    M-W-F  . Gangrene of toe (HCC)    dry  . Hemorrhoids   . Hx of colonic polyps   . Hyperlipidemia   . Hypertension   . Hypertrophy of prostate without urinary obstruction and other lower urinary tract symptoms (LUTS)   . Ischemic cardiomyopathy    a. EF 40% in 2007. b. 51% by nuc 05/2011. c. EF 25% by cath and 35-40% by echo in 2018.  [name] Kidney  stones   . Lower limb amputation, below knee   . Myocardial infarction ([place]) 2005  . Neuromuscular disorder ([place])    diabetic neuropathy  . NSTEMI (non-ST elevated myocardial infarction) ([place]) 04/2016  . PAD (peripheral artery disease) ([place])    a. R CEA 2007. b. Hx L BKA in 2013. c. s/p LE angioplasty in 2014. d. Hx R BKA in 2014.  [name] PAF (paroxysmal atrial fibrillation) ([place])    a. dx 06/2016.  [name] Peripheral neuropathy   . Prostate cancer ([place]) 2010  . Secondary hyperparathyroidism ([place])    Secondary Hyperpara- Thyroidism, Renal  . Type II diabetes mellitus ([place])      Assessment: 52 yoM on warfarin PTA for AFib and remote hx DVT admitted with abdominal pain. INR on admit subtherapeutic at 1.73, last dose of warfarin was yesterday per pt. With subtherapeutic INR will give slightly boosted dose tonight.  *PTA Dose = 7.5mg  PO daily  Goal of Therapy:  INR 2-3 Monitor platelets by anticoagulation protocol: Yes   Plan:  -Warfarin 10mg  PO x1 tonight -Daily INR  Aaron Long, PharmD, BCPS Clinical Pharmacist [phone] Please check AMION for all [facility] numbers 08/08/2017

## 2017-08-08 NOTE — ED Notes (Addendum)
Pt O2 87% on RA with good pleth on oxygen monitoring. Pt placed on 2L oxygen. O2 sat on 2L O2 99%. PA Raquel Sarna made aware.

## 2017-08-08 NOTE — ED Notes (Signed)
ED Provider at bedside. 

## 2017-08-08 NOTE — ED Provider Notes (Signed)
  Face-to-face evaluation   History: He presents for evaluation of intermittent vomiting for 3 weeks.  Previously evaluated here.  Today did not go to dialysis because he was not feeling well.  Physical exam: Oxygen saturation 93% on room air at 4:20 PM.  Heart regular rate and rhythm without murmur lungs clear anteriorly.  No audible wheezes rales or rhonchi.  Abdomen soft without localized tenderness.  Medical screening examination/treatment/procedure(s) were conducted as a shared visit with non-physician practitioner(s) and myself.  I personally evaluated the patient during the encounter    Daleen Bo, MD 08/10/17 [phone]

## 2017-08-08 NOTE — ED Provider Notes (Signed)
Care assumed from [name], PA-C at shift change with [place] and pharm consult pending.   In brief, this patient is a 77 y.o. patient presents for evaluation of right upper quadrant abdominal pain postprandial vomiting.  She also endorses general malaise, cough, nasal congestion and rhinorrhea that is been ongoing for last 2 days.  Patient reports that he just did not feel well for the last 2 days.  Patient states that he missed his dialysis appointment today because he was feeling too sick.  Reports decreased p.o. secondary to symptoms.  No fevers, chest pain, difficulty breathing.  Patient does not require any home O2.  She will continually dropped down into the 80s on oxygen saturation.  Placed on 2 L O2 with improvement and was able to be removed..  Please see note from previous provider for full history and physical.  PLAN and MDM:  Patient had previous findings of elevated LFTs and was evaluated ultrasound a few months ago.  No findings noted. His HIDA scan was unremarkable.  His LFTs are elevated today.  Right upper quadrant ultrasound pending.  Additionally, patient was found to have pneumonia on chest x-ray.  Given his ESRD, will plan to consult pharmacy for antibiotic choice.  Ultrasound showed no evaluation of cholecystitis.  He does have evidence of cholelithiasis and some gallbladder wall thickening but no evidence of sludge.   Discussed with farm.  Option would be Augmentin 500 mg / 125 mg every 24 hours.  This is suboptimal as it would not cover everything but would be the most reasonable choice given patient's ESRD.  Even his elevation in LFTs, cannot start him on doxycycline.  Informed me that patient's O2 sats kept dropping down between 87%-90%.  He does have a PSI score of 87 so observation could be warranted in this case, vertically given oxygenation requirement and ESRD.  He will likely need dialysis.  Patient started on broad-spectrum antibiotics.  Discussed patient with Dr.  Roel Cluck (hospitalist).  Will admit.  Would like me to consult nephrology.  Discussed patient with Dr. Carolin Sicks (Nephrology).  Given patient stable labs, does not need emergent dialysis.  Does recommend and agree with admission with plans to treat the pneumonia.  Recommends no fluids given given concerns for overloaded fluid status.  He will plan to do dialysis tomorrow.   1. Community acquired pneumonia of right lung, unspecified part of lung   2. Elevated LFTs       Aaron Long 08/08/17 1919    Aaron Manifold, MD 08/12/17 [phone]

## 2017-08-08 NOTE — Progress Notes (Signed)
Pharmacy Antibiotic Note  Aaron Long is a 77 y.o. male admitted on 08/08/2017 with pneumonia.  Pharmacy has been consulted for vancomycin and Cefepime dosing.  Plan: Vancomycin 1500mg  IV once then 750mg  IV every MWF HD hours.  Goal trough 15-20 mcg/mL. Cefepime 1G IV QD in the evening Monitor LOT and clinical progression Vancomycin levels as appropriate    Temp (24hrs), Avg:98.1 F (36.7 C), Min:97.7 F (36.5 C), Max:98.5 F (36.9 C)  Recent Labs  Lab 08/08/17 1143  WBC 7.4  CREATININE 7.78*    CrCl cannot be calculated (Unknown ideal weight.).    Allergies  Allergen Reactions  . Oxycodone Other (See Comments)    Hallucinations   Thank you for allowing pharmacy to be a part of this patient's care.  Jodean Lima Taneya Conkel 08/08/2017 7:18 PM

## 2017-08-08 NOTE — ED Notes (Signed)
Patient transported to X-ray 

## 2017-08-08 NOTE — H&P (Signed)
Aaron Long [id number]:[id number] DOB: [birth year] DOA: 08/08/2017     PCP: Bartholome Bill, MD  At [facility] Outpatient Specialists:  CARDS:   Dr. At [facility] NEphrology:    Dr. Justin Mend   Vascular surgery Dr. Oneida Alar Patient arrived to ER on 08/08/17 at 1129  Patient coming from:    home Lives With family wife    Chief Complaint:  Chief Complaint  Patient presents with  . Abdominal Pain    HPI: Aaron Long is a 77 y.o. male with medical history significant of ESRD on HD  MWF, uncontrolled DM 2, hyperlipidemia, peripheral vascular disease, CAD History of DVT 2013, status post bilateral BKA HTN, ischemic cardiomyopathy.  Presented with  Abdominal pain, fatigue malaise for the past few days. He reports tightness in his abdomen cold like symptoms with nonproductive cough and runny nose for at least 2 weeks no associated diarrhea his urine has been darker than usual he has had decreased by mouth intake but no fevers or chills no chest pain no wheezing he is not on oxygen at home. Family states he's been having worsening cough for at least 2 weeks now. Reports regular BM's occasional constipation.   He was feeling so bad that he did not go to dialysis today his dialysis was Friday. he has access in the left upper arm and has been followed by vascular surgery. He has history of Chronic abdominal pain. patient was recently admitted on June 19 due to abdominal pain at that time ultrasound was equivocal hiatus scan was done which was no active for acute cholecystitis during that admission patient has been seen in consult by general surgery no indication for operative intervention abdominal pain has resolved. Regarding pertinent Chronic problems: regarding history of coronary artery disease status post CABG in 2007  last echogram done in April 2018 showing EF 35-40 percent grade 1 diastolic dysfunction Shand on Coumadin for history of atrial fibrillation While in ER: Noted hypoxic down  to high 80's needed 2 L  Found LFT elevations US showed gall stones no cholecystitis, CXR showed PNA   Following Medications were ordered in ER: Medications - No data to display  Significant initial  Findings: Abnormal Labs Reviewed  COMPREHENSIVE METABOLIC PANEL - Abnormal; Notable for the following components:      Result Value   Glucose, Bld 182 (*)    BUN 30 (*)    Creatinine, Ser 7.78 (*)    Calcium 8.8 (*)    AST 201 (*)    ALT 189 (*)    Alkaline Phosphatase 143 (*)    Total Bilirubin 1.3 (*)    GFR calc non Af Amer 6 (*)    GFR calc Af Amer 7 (*)    Anion gap 17 (*)    All other components within normal limits  CBC - Abnormal; Notable for the following components:   RBC 3.96 (*)    Hemoglobin 12.5 (*)    MCV 105.3 (*)    RDW 16.0 (*)    Platelets 113 (*)    All other components within normal limits     Na 141 K 4.8  Cr on HD Lab Results  Component Value Date   CREATININE 7.78 (H) 08/08/2017   CREATININE 8.41 (H) 07/01/2017   CREATININE 6.33 (H) 06/30/2017      WBC  7.4  HG/HCT   stable,       Component Value Date/Time   HGB 12.5 (L) 08/08/2017 1143  HGB 12.1 (L) 04/10/2015 1535   HCT 41.7 08/08/2017 1143   HCT 38.0 04/10/2015 1535     BNP (last 3 results) No results for input(s): BNP in the last 8760 hours.  ProBNP (last 3 results) No results for input(s): PROBNP in the last 8760 hours.  Lactic Acid, Venous    Component Value Date/Time   LATICACIDVEN 1.99 09/30/2014 2147      UA ordered   US RUQ - cholelithiasis but stable     CXR - PNA? costophrenic angle    ECG:  Personally reviewed by me showing: HR : 91 Rhythm: NSR   no evidence of ischemic changes QTC 482       ED Triage Vitals  Enc Vitals Group     BP 08/08/17 1139 (!) 155/70     Pulse Rate 08/08/17 1139 90     Resp 08/08/17 1139 16     Temp 08/08/17 1139 97.7 F (36.5 C)     Temp Source 08/08/17 1139 Oral     SpO2 08/08/17 1139 99 %     Weight --       Height --      Head Circumference --      Peak Flow --      Pain Score 08/08/17 1138 10     Pain Loc --      Pain Edu? --      Excl. in [place]? --   TMAX(24)@       Latest  Blood pressure (!) 156/84, pulse 88, temperature 98.5 F (36.9 C), temperature source Oral, resp. rate 18, SpO2 96 %.      ER Provider Called: Nephrology      Dr.Bhadari They Recommend admit for treatment of pneumonia will do hemodialysis either tonight or tomorrow morning    Hospitalist was called for admission for H CAP versus CHF exacerbation   Review of Systems:    Pertinent positives include: shortness of breath at rest.  dyspnea on exertion, productive cough, excess mucus,  chills, fatigue, Constitutional:  No weight loss, night sweats, Fevers, weight loss  HEENT:  No headaches, Difficulty swallowing,Tooth/dental problems,Sore throat,  No sneezing, itching, ear ache, nasal congestion, post nasal drip,  Cardio-vascular:  No chest pain, Orthopnea, PND, anasarca, dizziness, palpitations.no Bilateral lower extremity swelling  GI:  No heartburn, indigestion, abdominal pain, nausea, vomiting, diarrhea, change in bowel habits, loss of appetite, melena, blood in stool, hematemesis Resp:  no  No No coughing up of blood.No change in color of mucus.No wheezing. Skin:  no rash or lesions. No jaundice GU:  no dysuria, change in color of urine, no urgency or frequency. No straining to urinate.  No flank pain.  Musculoskeletal:  No joint pain or no joint swelling. No decreased range of motion. No back pain.  Psych:  No change in mood or affect. No depression or anxiety. No memory loss.  Neuro: no localizing neurological complaints, no tingling, no weakness, no double vision, no gait abnormality, no slurred speech, no confusion  All systems reviewed and apart from [place] all are negative  Past Medical History:   Past Medical History:  Diagnosis Date  . Abnormality of gait   . Anemia   . BPH (benign  prostatic hypertrophy)   . Chronic combined systolic and diastolic CHF (congestive heart failure) ([place])   . Complication of anesthesia    "he gets delirious"  . Constipation    takes Miralax daily  . Coronary artery disease    a. s/p  NSTEMI/CABGx4 in 2007 - LIMA-LAD, SVG-optional diagonal, SVVG-OM, SVG-dRCA. b. Nuc 05/2011: nonischemic. c. all bypass grafts widely patent, 80% stenosis involving the grafted OM proximal to SVG anastomosis, which backfills the distal LCx, moderately elevated LVEDP, EF 25%.   . Diabetic peripheral neuropathy ([place])   . Dieulafoy lesion of rectum 05/03/2015  . Disorder of bone and cartilage, unspecified   . DVT (deep venous thrombosis) ([place])    a. Lower extremity DVT in 2013.  [name] ESRD on dialysis [facility])    M-W-F  . Gangrene of toe (HCC)    dry  . Hemorrhoids   . Hx of colonic polyps   . Hyperlipidemia   . Hypertension   . Hypertrophy of prostate without urinary obstruction and other lower urinary tract symptoms (LUTS)   . Ischemic cardiomyopathy    a. EF 40% in 2007. b. 51% by nuc 05/2011. c. EF 25% by cath and 35-40% by echo in 2018.  [name] Kidney stones   . Lower limb amputation, below knee   . Myocardial infarction ([place]) 2005  . Neuromuscular disorder ([place])    diabetic neuropathy  . NSTEMI (non-ST elevated myocardial infarction) ([place]) 04/2016  . PAD (peripheral artery disease) ([place])    a. R CEA 2007. b. Hx L BKA in 2013. c. s/p LE angioplasty in 2014. d. Hx R BKA in 2014.  [name] PAF (paroxysmal atrial fibrillation) ([place])    a. dx 06/2016.  [name] Peripheral neuropathy   . Prostate cancer ([place]) 2010  . Secondary hyperparathyroidism ([place])    Secondary Hyperpara- Thyroidism, Renal  . Type II diabetes mellitus ([place])       Past Surgical History:  Procedure Laterality Date  . ABDOMINAL AORTAGRAM N/A 05/28/2011   Procedure: ABDOMINAL Maxcine Ham;  Surgeon: Elam Dutch, MD;  Location: [facility] CATH LAB;  Service: Cardiovascular;  Laterality: N/A;  . ABDOMINAL AORTAGRAM  N/A 06/02/2012   Procedure: ABDOMINAL Maxcine Ham;  Surgeon: Elam Dutch, MD;  Location: [facility] CATH LAB;  Service: Cardiovascular;  Laterality: N/A;  . ABDOMINAL AORTAGRAM N/A 06/09/2012   Procedure: ABDOMINAL Maxcine Ham;  Surgeon: Elam Dutch, MD;  Location: [facility] CATH LAB;  Service: Cardiovascular;  Laterality: N/A;  . AMPUTATION  06/14/2011   Procedure: AMPUTATION DIGIT;  Surgeon: Elam Dutch, MD;  Location: [facility] OR;  Service: Vascular;  Laterality: Left;  Amputation Left fifth toe  . AMPUTATION  06/16/2011   Procedure: AMPUTATION BELOW KNEE;  Surgeon: Elam Dutch, MD;  Location: [place];  Service: Vascular;  Laterality: Left;  . AMPUTATION Right 06/12/2012   Procedure: AMPUTATION DIGIT;  Surgeon: Elam Dutch, MD;  Location: [place];  Service: Vascular;  Laterality: Right;  GREAT TOE  . AMPUTATION Right 08/08/2012   Procedure: AMPUTATION BELOW KNEE;  Surgeon: Elam Dutch, MD;  Location: [place];  Service: Vascular;  Laterality: Right;  . AV FISTULA PLACEMENT Right 04/19/2014   Procedure: RIGHT ARM ARTERIOVENOUS (AV) FISTULA CREATION;  Surgeon: Mal Misty, MD;  Location: [place];  Service: Vascular;  Laterality: Right;  . AV FISTULA PLACEMENT Left 12/12/2014   Procedure: BRACHIOCEPHALIC ARTERIOVENOUS (AV) FISTULA CREATION;  Surgeon: Mal Misty, MD;  Location: [place];  Service: Vascular;  Laterality: Left;  . CARDIAC CATHETERIZATION  05/28/11  . CARDIAC CATHETERIZATION N/A 10/04/2014   Procedure: Left Heart Cath and Coronary Angiography;  Surgeon: Sherren Mocha, MD;  Location: [place] CV LAB;  Service: Cardiovascular;  Laterality: N/A;  . CAROTID ENDARTERECTOMY Right 2005  . CATARACT EXTRACTION W/ INTRAOCULAR  LENS  IMPLANT, BILATERAL Bilateral 2011  . COLONOSCOPY    . COLONOSCOPY N/A 05/03/2015   Procedure: COLONOSCOPY;  Surgeon: Gatha Mayer, MD;  Location: [place];  Service: Endoscopy;  Laterality: N/A;  . CORONARY ARTERY BYPASS GRAFT  2005   CABG X4  . ENDARTERECTOMY Left  10/17/2014   Procedure: ENDARTERECTOMY CAROTID;  Surgeon: Mal Misty, MD;  Location: [place];  Service: Vascular;  Laterality: Left;  . I&D EXTREMITY  01/31/2012   Procedure: IRRIGATION AND DEBRIDEMENT EXTREMITY;  Surgeon: Elam Dutch, MD;  Location: [place];  Service: Vascular;  Laterality: Left;  I & D Left BKA   . INSERTION OF DIALYSIS CATHETER Right 04/19/2014   Procedure: INSERTION OF DIALYSIS CATHETER-INTERNAL JUGULAR;  Surgeon: Mal Misty, MD;  Location: [place];  Service: Vascular;  Laterality: Right;  . INSERTION OF DIALYSIS CATHETER N/A 11/06/2014   Procedure: INSERTION OF 23cm DIALYSIS CATHETER - right internal jugular;  Surgeon: Mal Misty, MD;  Location: [place];  Service: Vascular;  Laterality: N/A;  . KNEE CARTILAGE SURGERY Left 1964  . LEFT HEART CATH AND CORONARY ANGIOGRAPHY N/A 04/12/2016   Procedure: Left Heart Cath and Coronary Angiography;  Surgeon: Nelva Bush, MD;  Location: [place] CV LAB;  Service: Cardiovascular;  Laterality: N/A;  . LIGATION OF ARTERIOVENOUS  FISTULA Right 11/06/2014   Procedure: LIGATION OF RADIOCEPHALIC ARTERIOVENOUS FISTULA;  Surgeon: Mal Misty, MD;  Location: [place];  Service: Vascular;  Laterality: Right;  . LIGATION OF COMPETING BRANCHES OF ARTERIOVENOUS FISTULA Right 06/19/2014   Procedure: Right Arm LIGATION OF COMPETING BRANCHES OF RADIOCEPHALIC ARTERIOVENOUS FISTULA;  Surgeon: Mal Misty, MD;  Location: [place];  Service: Vascular;  Laterality: Right;  . PROSTATECTOMY  2009    Social History:  Ambulatory  wheelchair bound      reports that he quit smoking about 26 years ago. His smoking use included cigarettes. He has a 30.00 pack-year smoking history. He has never used smokeless tobacco. He reports that he drinks alcohol. He reports that he does not use drugs.     Family History:   Family History  Problem Relation Age of Onset  . Hyperlipidemia Mother   . Hypertension Mother   . Cancer Mother   . Hyperlipidemia  Father   . Hypertension Father   . Kidney disease Father   . Heart disease Sister   . Alzheimer's disease Sister   . Cancer Brother   . Other Sister   . Coronary artery disease Neg Hx   . Anesthesia problems Neg Hx   . Hypotension Neg Hx   . Malignant hyperthermia Neg Hx   . Pseudochol deficiency Neg Hx     Allergies: Allergies  Allergen Reactions  . Oxycodone Other (See Comments)    Hallucinations     Prior to Admission medications   Medication Sig Start Date End Date Taking? Authorizing Provider  amLODipine (NORVASC) 5 MG tablet Take 1 tablet (5 mg total) by mouth daily. Take one tablet by mouth once daily 04/13/16   Rai, Vernelle Emerald, MD  aspirin EC 81 MG tablet Take 81 mg by mouth daily.    [provider]  AURYXIA 1 GM 210 MG(Fe) tablet Take 420 mg by mouth 3 (three) times daily. 03/18/16   [provider]  calcium acetate (PHOSLO) 667 MG capsule Take 667 mg by mouth daily. 08/27/15   [provider]  carvedilol (COREG) 6.25 MG tablet Take 1 tablet (6.25 mg total) by mouth  2 (two) times daily with a meal. 07/06/16   [name], MD  hydrALAZINE (APRESOLINE) 25 MG tablet Take 1 tablet (25 mg total) by mouth every 8 (eight) hours. 04/13/16   Rai, Vernelle Emerald, MD  isosorbide mononitrate (IMDUR) 30 MG 24 hr tablet Take 1 tablet (30 mg total) by mouth daily. 04/14/16   Rai, Ripudeep K, MD  LEVEMIR FLEXTOUCH 100 UNIT/ML Pen Inject 13 Units as directed daily.  04/05/16   [provider]  NOVOLOG FLEXPEN 100 UNIT/ML FlexPen CBG 70 - 120: 0 units  CBG 121 - 150: 1 unit  CBG 151 - 200: 2 units  CBG 201 - 250: 3 units  CBG 251 - 300: 5 units  CBG 301 - 350: 7 units  CBG 351 - 400: 9 units 04/06/16   [provider]  polyethylene glycol (MIRALAX) packet Take 17 g by mouth daily. 04/05/16   Estill Dooms, MD  pravastatin (PRAVACHOL) 40 MG tablet Take 40 mg by mouth daily.    [provider]  pregabalin (LYRICA) 75 MG capsule Take 75 mg by  mouth as needed.     [provider]  QUEtiapine (SEROQUEL) 25 MG tablet Take 1 tablet (25 mg total) by mouth daily. Patient taking differently: Take 25 mg by mouth as needed.  04/05/16   Estill Dooms, MD  warfarin (COUMADIN) 7.5 MG tablet Take 1 tablet (7.5 mg total) by mouth daily at 6 PM. Patient taking differently: Take 7.5 mg by mouth See admin instructions. 7.5 mg every evening, EXCEPT Sunday take 11.25 mg 07/06/16   [name], MD   Physical Exam: Blood pressure (!) 156/84, pulse 88, temperature 98.5 F (36.9 C), temperature source Oral, resp. rate 18, SpO2 96 %. 1. General:  in No Acute distress * Chronically ill -appearing 2. Psychological: Alert and  Oriented 3. Head/ENT:   Moist   Mucous Membranes                          Head Non traumatic, neck supple                           Poor Dentition 4. SKIN: normal    Skin turgor,  Skin clean Dry and intact no rash 5. Heart: Regular rate and rhythm no  Murmur, no Rub or gallop 6. Lungs  no wheezes some crackles   7. Abdomen: Soft,  non-tender, Non distended   obese  bowel sounds present 8. Lower extremities: no clubbing, cyanosis, bilateral BKA 9. Neurologically Grossly intact, moving all 4 extremities equally   10. MSK: Normal range of motion   LABS:     Recent Labs  Lab 08/08/17 1143  WBC 7.4  HGB 12.5*  HCT 41.7  MCV 105.3*  PLT 732*   Basic Metabolic Panel: Recent Labs  Lab 08/08/17 1143  NA 141  K 4.8  CL 98  CO2 26  GLUCOSE 182*  BUN 30*  CREATININE 7.78*  CALCIUM 8.8*      Recent Labs  Lab 08/08/17 1143  AST 201*  ALT 189*  ALKPHOS 143*  BILITOT 1.3*  PROT 7.3  ALBUMIN 3.8   Recent Labs  Lab 08/08/17 1143  LIPASE 41   No results for input(s): AMMONIA in the last 168 hours.    HbA1C: No results for input(s): HGBA1C in the last 72 hours. CBG: No results for input(s): GLUCAP in the last 168  hours.    Urine analysis:    Component Value Date/Time   COLORURINE YELLOW  10/15/2014 1344   APPEARANCEUR CLEAR 10/15/2014 1344   APPEARANCEUR Turbid (A) 07/12/2014 1644   LABSPEC 1.017 10/15/2014 1344   PHURINE 6.0 10/15/2014 1344   GLUCOSEU >1000 (A) 10/15/2014 1344   HGBUR SMALL (A) 10/15/2014 1344   BILIRUBINUR NEGATIVE 10/15/2014 1344   BILIRUBINUR Negative 07/12/2014 1644   KETONESUR NEGATIVE 10/15/2014 1344   PROTEINUR >300 (A) 10/15/2014 1344   UROBILINOGEN 0.2 10/15/2014 1344   NITRITE NEGATIVE 10/15/2014 1344   LEUKOCYTESUR NEGATIVE 10/15/2014 1344   LEUKOCYTESUR 3+ (A) 07/12/2014 1644       Cultures:    Component Value Date/Time   SDES BLOOD LEFT HAND 10/01/2014 0245   SPECREQUEST BOTTLES DRAWN AEROBIC AND ANAEROBIC 5CC 10/01/2014 0245   CULT NO GROWTH 5 DAYS 10/01/2014 0245   REPTSTATUS 10/06/2014 FINAL 10/01/2014 0245     Radiological Exams on Admission: Dg Chest 2 View  Result Date: 08/08/2017 CLINICAL DATA:  Two week history of cough. EXAM: CHEST - 2 VIEW COMPARISON:  Chest x-rays dated 06/30/2017 in 06/28/2016. FINDINGS: Stable cardiomegaly. Median sternotomy wires are stable in alignment. Atherosclerotic changes again noted at the aortic arch. Coarse interstitial markings again noted bilaterally suggesting some degree of chronic interstitial lung disease, although increased compared to previous studies suggesting superimposed edema. New dense opacity at the posterior costophrenic angle, uncertain side as it is only seen on the lateral view, pneumonia versus atelectasis. No pleural effusion or pneumothorax seen. IMPRESSION: 1. New dense opacity at the posterior costophrenic angle, pneumonia versus atelectasis, suspicious for pneumonia given the history of productive cough. 2. Probable bilateral interstitial edema, indicating mild CHF/volume overload, superimposed on chronic interstitial lung disease. 3. Stable cardiomegaly. 4. Aortic atherosclerosis. Electronically Signed   By: Franki Cabot M.D.   On: 08/08/2017 15:12   US Abdomen Limited  Ruq  Result Date: 08/08/2017 CLINICAL DATA:  Elevated LFTs. EXAM: ULTRASOUND ABDOMEN LIMITED RIGHT UPPER QUADRANT COMPARISON:  06/29/2017. FINDINGS: Gallbladder: Small gallstones are present, with the largest measuring 6 mm in single dimension. Gallbladder wall thickening, upper limits of normal, mildly increased at 3.3 mm. No pericholecystic fluid. Negative sonographic Murphy's sign. Common bile duct: Diameter: 3 mm, normal. Liver: No focal lesion identified. Within normal limits in parenchymal echogenicity. Portal vein is patent on color Doppler imaging with normal direction of blood flow towards the liver. IMPRESSION: Cholelithiasis, without gallbladder wall tenderness, but with stable mild gallbladder wall thickening of 3 mm. No biliary ductal dilatation to suggest an obstructive cause of LFTs. If further investigation desired, consider nuclear medicine hepatobiliary imaging. Electronically Signed   By: Staci Righter M.D.   On: 08/08/2017 16:28    Chart has been reviewed    Assessment/Plan   77 y.o. male with medical history significant of ESRD on HD  MWF, uncontrolled DM 2, hyperlipidemia, peripheral vascular disease, CAD History of DVT 2013, status post bilateral BKA HTN, ischemic cardiomyopathy. Admitted for HCAP acute respiratory failure with hypoxia  Present on Admission: HCAP -  treat his cefepime and vancomycin await results of respiratory panel blood cultures and sputum cultures check for strep antigen  . Anemia in chronic renal disease  - appears to be currently at baseline continue to follow  . Atrial fibrillation ([place]) -          - CHA2DS2 vas score 5: continue current anticoagulation with  Coumadin per pharmacy         -  Rate  control:  Currently controlled with  Coreg will continue           . CAD (coronary artery disease) -  stable continue home medications  . DM type 2, uncontrolled, with renal complications (HCC) -  - Order Sensitive  SSI   - continue home insulin  regimen   -  check TSH and HgA1C   . GERD (gastroesophageal reflux disease) stable continue home medications  . HTN (hypertension) stable restart home medications . Hyperlipidemia LDL goal <70 restart home medications   . Pulmonary edema possibly secondary to noncompliance with hemodialysis versus acute combined systolic diastolic CHF exacerbation will observe on telemetry.  Obtain repeat echo nephrology is aware we will plan to dialyze later today tomorrow . Upper abdominal pain -abdominal ultrasound unchanged recently had a negative HIDA scan continue to monitor possible abdominal discomfort secondary to fluid overload. . Elevated LFTs -continue to monitor known history of cholelithiasis no evidence of acute obstruction hepatitis serologies negative in 2016 can repeat also possibility of cardia hepatic syndrome should be considered . Cholelithiasis no evidence of cholecystitis per surgery in the past not a candidate for surgical intervention   Other plan as per orders.  DVT prophylaxis:  Coumadin  Code Status:  FULL CODE  as per patient  I had personally discussed CODE STATUS with patient and family  Family Communication:   Family  at  Bedside  plan of care was discussed with Wife,  daughter  Disposition Plan:   To home once workup is complete and patient is stable                                      Nutrition    consulted                                    Consults called: Nephrology   Admission status:   inpatient      Level of care     tele               Toy Baker 08/08/2017, 8:43 PM    [facility]  Pager [phone]   after 2 AM please page floor coverage PA If 7AM-7PM, please contact the day team taking care of the patient  [email or web address]  Password [email or web address]

## 2017-08-08 NOTE — ED Notes (Signed)
Pt's room air O2 sat while sleeping was 80%. Placed on 4L O2 and O2 sat now 100% on 4L.

## 2017-08-09 ENCOUNTER — Inpatient Hospital Stay (HOSPITAL_COMMUNITY): Payer: Medicare Other

## 2017-08-09 ENCOUNTER — Other Ambulatory Visit: Payer: Self-pay

## 2017-08-09 ENCOUNTER — Other Ambulatory Visit (HOSPITAL_COMMUNITY): Payer: Medicare Other

## 2017-08-09 DIAGNOSIS — R140 Abdominal distension (gaseous): Secondary | ICD-10-CM

## 2017-08-09 DIAGNOSIS — R1084 Generalized abdominal pain: Secondary | ICD-10-CM

## 2017-08-09 DIAGNOSIS — E8770 Fluid overload, unspecified: Secondary | ICD-10-CM

## 2017-08-09 LAB — RESPIRATORY PANEL BY PCR
ADENOVIRUS-RVPPCR: NOT DETECTED
BORDETELLA PERTUSSIS-RVPCR: NOT DETECTED
CHLAMYDOPHILA PNEUMONIAE-RVPPCR: NOT DETECTED
CORONAVIRUS HKU1-RVPPCR: NOT DETECTED
CORONAVIRUS NL63-RVPPCR: NOT DETECTED
Coronavirus 229E: NOT DETECTED
Coronavirus OC43: NOT DETECTED
INFLUENZA A-RVPPCR: NOT DETECTED
Influenza B: NOT DETECTED
MYCOPLASMA PNEUMONIAE-RVPPCR: NOT DETECTED
Metapneumovirus: NOT DETECTED
PARAINFLUENZA VIRUS 3-RVPPCR: NOT DETECTED
PARAINFLUENZA VIRUS 4-RVPPCR: NOT DETECTED
Parainfluenza Virus 1: NOT DETECTED
Parainfluenza Virus 2: NOT DETECTED
RHINOVIRUS / ENTEROVIRUS - RVPPCR: NOT DETECTED
Respiratory Syncytial Virus: NOT DETECTED

## 2017-08-09 LAB — COMPREHENSIVE METABOLIC PANEL
ALT: 456 U/L — ABNORMAL HIGH (ref 0–44)
AST: 414 U/L — ABNORMAL HIGH (ref 15–41)
Albumin: 3.6 g/dL (ref 3.5–5.0)
Alkaline Phosphatase: 141 U/L — ABNORMAL HIGH (ref 38–126)
Anion gap: 15 (ref 5–15)
BILIRUBIN TOTAL: 1.1 mg/dL (ref 0.3–1.2)
BUN: 40 mg/dL — ABNORMAL HIGH (ref 8–23)
CO2: 27 mmol/L (ref 22–32)
Calcium: 8.4 mg/dL — ABNORMAL LOW (ref 8.9–10.3)
Chloride: 95 mmol/L — ABNORMAL LOW (ref 98–111)
Creatinine, Ser: 8.71 mg/dL — ABNORMAL HIGH (ref 0.61–1.24)
GFR, EST AFRICAN AMERICAN: 6 mL/min — AB (ref >60)
GFR, EST NON AFRICAN AMERICAN: 5 mL/min — AB (ref >60)
Glucose, Bld: 208 mg/dL — ABNORMAL HIGH (ref 70–99)
Potassium: 5.2 mmol/L — ABNORMAL HIGH (ref 3.5–5.1)
Sodium: 137 mmol/L (ref 135–145)
TOTAL PROTEIN: 7 g/dL (ref 6.5–8.1)

## 2017-08-09 LAB — PREALBUMIN: PREALBUMIN: 23.8 mg/dL (ref 18–38)

## 2017-08-09 LAB — CBC
HEMATOCRIT: 38.2 % — AB (ref 39.0–52.0)
Hemoglobin: 11.8 g/dL — ABNORMAL LOW (ref 13.0–17.0)
MCH: 31.4 pg (ref 26.0–34.0)
MCHC: 30.9 g/dL (ref 30.0–36.0)
MCV: 101.6 fL — ABNORMAL HIGH (ref 78.0–100.0)
Platelets: 113 10*3/uL — ABNORMAL LOW (ref 150–400)
RBC: 3.76 MIL/uL — AB (ref 4.22–5.81)
RDW: 16.1 % — ABNORMAL HIGH (ref 11.5–15.5)
WBC: 7.2 10*3/uL (ref 4.0–10.5)

## 2017-08-09 LAB — PHOSPHORUS: PHOSPHORUS: 5.7 mg/dL — AB (ref 2.5–4.6)

## 2017-08-09 LAB — GLUCOSE, CAPILLARY
Glucose-Capillary: 186 mg/dL — ABNORMAL HIGH (ref 70–99)
Glucose-Capillary: 111 mg/dL — ABNORMAL HIGH (ref 70–99)
Glucose-Capillary: 205 mg/dL — ABNORMAL HIGH (ref 70–99)
Glucose-Capillary: 122 mg/dL — ABNORMAL HIGH (ref 70–99)

## 2017-08-09 LAB — TSH: TSH: 4.043 u[IU]/mL (ref 0.350–4.500)

## 2017-08-09 LAB — HEMOGLOBIN A1C
HEMOGLOBIN A1C: 7.7 % — AB (ref 4.8–5.6)
Mean Plasma Glucose: 174.29 mg/dL

## 2017-08-09 LAB — MAGNESIUM: Magnesium: 2.3 mg/dL (ref 1.7–2.4)

## 2017-08-09 LAB — PROTIME-INR
INR: 1.88
Prothrombin Time: 21.5 seconds — ABNORMAL HIGH (ref 11.4–15.2)

## 2017-08-09 LAB — MRSA PCR SCREENING: MRSA BY PCR: NEGATIVE

## 2017-08-09 LAB — HIV ANTIBODY (ROUTINE TESTING W REFLEX): HIV SCREEN 4TH GENERATION: NONREACTIVE

## 2017-08-09 LAB — BRAIN NATRIURETIC PEPTIDE: B Natriuretic Peptide: >4500 pg/mL — ABNORMAL HIGH (ref 0.0–100.0)

## 2017-08-09 MED ORDER — SODIUM CHLORIDE 0.9 % IV SOLN: 100.0000 mL | INTRAVENOUS | Status: DC | PRN

## 2017-08-09 MED ORDER — DOXERCALCIFEROL 4 MCG/2ML IV SOLN
6.0000 ug | Freq: Once | INTRAVENOUS | Status: AC
  Administered 2017-08-09: 6 ug via INTRAVENOUS
  Filled 2017-08-09: qty 4

## 2017-08-09 MED ORDER — PENTAFLUOROPROP-TETRAFLUOROETH EX AERO: 1.0000 "application " | INHALATION_SPRAY | CUTANEOUS | Status: DC | PRN

## 2017-08-09 MED ORDER — NEPRO/CARBSTEADY PO LIQD
237.0000 mL | Freq: Two times a day (BID) | ORAL | Status: DC
  Administered 2017-08-10 (×2): 237 mL via ORAL
  Filled 2017-08-09 (×4): qty 237

## 2017-08-09 MED ORDER — CHLORHEXIDINE GLUCONATE CLOTH 2 % EX PADS
6.0000 | MEDICATED_PAD | Freq: Every day | CUTANEOUS | Status: DC
  Administered 2017-08-09 – 2017-08-11 (×2): 6 via TOPICAL

## 2017-08-09 MED ORDER — ALTEPLASE 2 MG IJ SOLR: 2.0000 mg | Freq: Once | INTRAMUSCULAR | Status: DC | PRN

## 2017-08-09 MED ORDER — DOXERCALCIFEROL 4 MCG/2ML IV SOLN
INTRAVENOUS | Status: AC
  Filled 2017-08-09: qty 4

## 2017-08-09 MED ORDER — AMLODIPINE BESYLATE 5 MG PO TABS: 5.0000 mg | ORAL_TABLET | Freq: Every day | ORAL | Status: DC

## 2017-08-09 MED ORDER — DOXERCALCIFEROL 4 MCG/2ML IV SOLN
6.0000 ug | INTRAVENOUS | Status: DC
  Administered 2017-08-10: 6 ug via INTRAVENOUS
  Filled 2017-08-09: qty 4

## 2017-08-09 MED ORDER — WARFARIN SODIUM 7.5 MG PO TABS
7.5000 mg | ORAL_TABLET | Freq: Once | ORAL | Status: AC
  Administered 2017-08-09: 7.5 mg via ORAL
  Filled 2017-08-09: qty 1

## 2017-08-09 MED ORDER — CHLORHEXIDINE GLUCONATE CLOTH 2 % EX PADS: 6.0000 | MEDICATED_PAD | Freq: Every day | CUTANEOUS | Status: DC

## 2017-08-09 MED ORDER — CINACALCET HCL 30 MG PO TABS
120.0000 mg | ORAL_TABLET | ORAL | Status: DC
  Administered 2017-08-10: 120 mg via ORAL
  Filled 2017-08-09 (×2): qty 4

## 2017-08-09 MED ORDER — LIDOCAINE-PRILOCAINE 2.5-2.5 % EX CREA
1.0000 "application " | TOPICAL_CREAM | CUTANEOUS | Status: DC | PRN
  Filled 2017-08-09: qty 5

## 2017-08-09 MED ORDER — LIDOCAINE HCL (PF) 1 % IJ SOLN: 5.0000 mL | INTRAMUSCULAR | Status: DC | PRN

## 2017-08-09 MED ORDER — RENA-VITE PO TABS
1.0000 | ORAL_TABLET | Freq: Every day | ORAL | Status: DC
  Administered 2017-08-09 – 2017-08-10 (×2): 1 via ORAL
  Filled 2017-08-09 (×2): qty 1

## 2017-08-09 NOTE — Care Management Note (Signed)
Case Management Note  Patient Details  Name: Aaron Long MRN: [id number] Date of Birth: September 22, 1940  Subjective/Objective:            Spoke w patient at bedside. He states he lives at home w his wife, neither of them drive. He uses [facility] to get to HD. His daughter assists with transportation as well. He denies difficulties obtaining medications. Spoke w Natividad Brood RN [facility] about patient as well. They will screen him tomorrow and may be able to follow through [facility] to provide additional community resource.         Action/Plan:  CM will continue to follow.   Expected Discharge Date:                  Expected Discharge Plan:  Home/Self Care  In-House Referral:     Discharge planning Services  CM Consult  Post Acute Care Choice:    Choice offered to:     DME Arranged:    DME Agency:     HH Arranged:    HH Agency:     Status of Service:  In process, will continue to follow  If discussed at Long Length of Stay Meetings, dates discussed:    Additional Comments:  Carles Collet, RN 08/09/2017, 3:21 PM

## 2017-08-09 NOTE — Progress Notes (Signed)
[facility] Progress Note  Subjective: abdomen feeling better after 2.3 L off on HD this am.    Vitals:   08/09/17 1230 08/09/17 1300 08/09/17 1302 08/09/17 1339  BP: 110/88 137/67 (!) 146/68 136/69  Pulse: 68 72 73 77  Resp:   18 20  Temp:   97.8 F (36.6 C) 97.8 F (36.6 C)  TempSrc:   Oral Oral  SpO2:   100% 100%  Weight:   69.8 kg (153 lb 14.1 oz)   Height:        Inpatient medications: . [START ON 08/10/2017] amLODipine  5 mg Oral QHS  . aspirin EC  81 mg Oral Daily  . calcium acetate  667 mg Oral Q breakfast  . carvedilol  6.25 mg Oral BID WC  . Chlorhexidine Gluconate Cloth  6 each Topical Q0600  . [START ON 08/10/2017] cinacalcet  120 mg Oral Once per day on Mon Wed Fri  . [START ON 08/10/2017] doxercalciferol  6 mcg Intravenous Q M,W,F-HD  . [START ON 08/10/2017] feeding supplement (NEPRO CARB STEADY)  237 mL Oral BID BM  . ferric citrate  420 mg Oral TID WC  . hydrALAZINE  25 mg Oral Q8H  . insulin aspart  0-5 Units Subcutaneous QHS  . insulin aspart  0-9 Units Subcutaneous TID WC  . insulin detemir  13 Units Subcutaneous Daily  . isosorbide mononitrate  30 mg Oral Daily  . multivitamin  1 tablet Oral QHS  . polyethylene glycol  17 g Oral Daily  . pravastatin  40 mg Oral Once per day on Sun Tue Thu Sat  . pregabalin  75 mg Oral BID  . sodium chloride flush  3 mL Intravenous Q12H  . warfarin  7.5 mg Oral ONCE-1800  . Warfarin - Pharmacist Dosing Inpatient   Does not apply q1800   . sodium chloride    . ceFEPime (MAXIPIME) IV     sodium chloride, acetaminophen **OR** acetaminophen, HYDROcodone-acetaminophen, ondansetron **OR** ondansetron (ZOFRAN) IV, sodium chloride flush  Exam:  alert chron ill appearing, no distress, calm  no jvd  chest bilat basilar rales, dec'd R base  cor reg no mrg  abd mildly distended, nontender no gross ascites no hsm  ext bilat aka no sig stump edema  neuro nf ox 3   Clinical Presentation Summary: Chief Complaint:   Abdominal Pain   HPI: Aaron Long is a 77 y.o. male with medical history significant of ESRD on HD  MWF, uncontrolled DM 2, hyperlipidemia, peripheral vascular disease, CAD, History of DVT 2013, status post bilateral BKA HTN, ischemic cardiomyopathy. Presented with abdominal pain, fatigue malaise for the past few days. He reports tightness in his abdomen cold like symptoms with nonproductive cough and runny nose for at least 2 weeks no associated diarrhea his urine has been darker than usual he has had decreased by mouth intake but no fevers or chills no chest pain no wheezing he is not on oxygen at home. Family states he's been having worsening cough for at least 2 weeks now. Reports regular BM's occasional constipation. He was feeling so bad that he did not go to dialysis today his dialysis was Friday. he has access in the left upper arm and has been followed by vascular surgery. He has history of Chronic abdominal pain. patient was recently admitted on June 19 due to abdominal pain at that time ultrasound was equivocal hiatus scan was done which was no active for acute cholecystitis during that admission patient has  been seen in consult by general surgery no indication for operative intervention abdominal pain has resolved. Regarding pertinent Chronic problems: regarding history of coronary artery disease status post CABG in 2007, last echogram done in April 2018 showing EF 35-40 percent grade 1 diastolic dysfunction; on Coumadin for history of atrial fibrillation While in ER: Noted hypoxic down to high 80's needed 2 L. Found LFT elevations US showed gall stones no cholecystitis. CXR showed PNA    Hospital Course:  1) Upper abdominal pain - abdominal ultrasound unchanged - recently had a negative HIDA scan during Jun 2019 admit  - abdominal discomfort may be secondary to fluid overload, better w/ HD last admit and better now today after HD   2) Pulmonary edema: IS edema on CXR and hypoxemia in  ED - renal says resp / CXR issues are prob all volume related, no fever/ or ^wbc >>  will dc IV abx - renal thinks patient may be losing body wt causing vol overload  3) Anemia in chronic renal disease  - appears to be currently at baseline continue to follow  4) Atrial fibrillation - CHA2DS2 vas score 5: continue coumadin per pharmacy -  Rate control:  Currently controlled with  Coreg will continue  5) CAD (coronary artery disease) -  stable continue home medications  6) DM type 2, uncontrolled, with renal complications -   Sensitive  SSI   - continue home levemir 13u qd  -  check TSH and HgA1C  7) HTN (hypertension) - cont home medications  8) Elevated LFTs - continue to monitor known history of cholelithiasis - no evidence of acute obstruction hepatitis, serologies negative in 2016 - ? Hepatic congestion, repeat in am  9) Cholelithiasis - no evidence of cholecystitis per surgery in the past - not a candidate for surgical intervention    DVT prophylaxis:  Coumadin Code Status:  full code per patient Family Communication: wife here today Disposition Plan:  to home once workup is complete and patient is stable  Consults called: Nephrology  Admission status: inpatient    Level of care: tele             Kelly Splinter MD [facility] pgr [phone] 08/09/2017, 4:36 PM   Recent Labs  Lab 08/08/17 1143 08/09/17 0532  NA 141 137  K 4.8 5.2*  CL 98 95*  CO2 26 27  GLUCOSE 182* 208*  BUN 30* 40*  CREATININE 7.78* 8.71*  CALCIUM 8.8* 8.4*  PHOS  --  5.7*   Recent Labs  Lab 08/08/17 1143 08/09/17 0532  AST 201* 414*  ALT 189* 456*  ALKPHOS 143* 141*  BILITOT 1.3* 1.1  PROT 7.3 7.0  ALBUMIN 3.8 3.6   Recent Labs  Lab 08/08/17 1143 08/09/17 0532  WBC 7.4 7.2  HGB 12.5* 11.8*  HCT 41.7 38.2*  MCV 105.3* 101.6*  PLT 113* 113*   Iron/TIBC/Ferritin/ %Sat    Component Value Date/Time   IRON 27 (L) 04/18/2014 1304   TIBC 240  04/18/2014 1304   FERRITIN 95 04/18/2014 1304   IRONPCTSAT 11 (L) 04/18/2014 1304

## 2017-08-09 NOTE — Progress Notes (Signed)
Initial Nutrition Assessment  DOCUMENTATION CODES:   Not applicable  INTERVENTION:   -Continue renal MVI daily -D/c Ensure Enlive po BID, each supplement provides 350 kcal and 20 grams of protein -Nepro Shake po BID, each supplement provides 425 kcal and 19 grams protein  NUTRITION DIAGNOSIS:   Increased nutrient needs related to chronic illness(ESRD on HD) as evidenced by estimated needs.  GOAL:   Patient will meet greater than or equal to 90% of their needs  MONITOR:   PO intake, Supplement acceptance, Labs, Weight trends, Skin, I & O's  REASON FOR ASSESSMENT:   Consult, Malnutrition Screening Tool Assessment of nutrition requirement/status(heart failure)  ASSESSMENT:   Aaron Long is a 77 y.o. male with medical history significant of ESRD on HD  MWF, uncontrolled DM 2, hyperlipidemia, peripheral vascular disease, CAD, history of of DVT 2013, status post bilateral BKA HTN, ischemic cardiomyopathy.   Pt admitted with HCAP with hypoxia  Reviewed I/O's: +1.2 L since admission  Spoke with pt at bedside, who is in good spirits at time of visit. Pt reports he just completed HD treatment prior to RD visit. Observed lunch tray- pt consumed about 25% of lunch tray (one bowl of coleslaw and 25% of chicken sandwich). Pt also consumed 100% of Nepro.   Pt reports that he changed his eating habits over the past month ("I've been eating less pork and beef") related to recent hospitalization for gallbladder issues. Pt consumes 2 meals per day (both on HD and non HD days). Brunch consists of eggs, toast, and liver pudding; Dinner: meatloaf and two sides. Pt consumes mainly diet Sprite. He does not drink Nepro regularly, but will occasionally receive it at the HD center.   Pt endorses some wt loss, but unsure how much. He does not know his UBW or dry weight. Noted pt has experienced a 8.3% wt loss over the past year, which is not significant for time frame.   Pt reports no concerns  with HD; per his reports there were no concerns during his last few visits and has been tolerating treatments well.   Discussed importance of good meal and supplement intake to promote healing.   Last Hgb A1c: 10.7 (04/11/16). PTA DM medications 13 units levemir daily and sliding scale insulin aspart TID.   Labs reviewed: K: 5.2, Phos: 5.7, corrected calcium: 8.7, CBGS: 154-186 (inpatient orders for glycemic control are 0-5 units insulin aspart q HS, 0-9 units insulin aspart TID with meals, and 13 units insulin detemir daily).   NUTRITION - FOCUSED PHYSICAL EXAM:    Most Recent Value  Orbital Region  No depletion  Upper Arm Region  Mild depletion [mild depletion noted in left arm where fistula is located, rt arm WDL]  Thoracic and Lumbar Region  No depletion  Buccal Region  No depletion  Temple Region  No depletion  Clavicle Bone Region  No depletion  Clavicle and Acromion Bone Region  No depletion  Scapular Bone Region  No depletion  Dorsal Hand  Moderate depletion  Patellar Region  No depletion  Anterior Thigh Region  No depletion  Posterior Calf Region  No depletion  Edema (RD Assessment)  None  Hair  Reviewed  Eyes  Reviewed  Mouth  Reviewed  Skin  Reviewed  Nails  Reviewed       Diet Order:   Diet Order           Diet renal/carb modified with fluid restriction Diet-HS Snack? Nothing; Fluid restriction: 1200 mL Fluid;  Room service appropriate? Yes; Fluid consistency: Thin  Diet effective now          EDUCATION NEEDS:   Education needs have been addressed  Skin:  Skin Assessment: Reviewed RN Assessment  Last BM:  08/07/17  Height:   Ht Readings from Last 1 Encounters:  08/08/17 5\' 8"  (1.727 m)    Weight:   Wt Readings from Last 1 Encounters:  08/09/17 153 lb 14.1 oz (69.8 kg)    Ideal Body Weight:  60.9 kg  BMI:  Body mass index is 23.4 kg/m.  Estimated Nutritional Needs:   Kcal:  1800-2000  Protein:  80-90 grams  Fluid:  per MD    Porshea Janowski  A. Jimmye Norman, RD, LDN, CDE Pager: [phone] After hours Pager: [phone]

## 2017-08-09 NOTE — Plan of Care (Signed)

## 2017-08-09 NOTE — Progress Notes (Signed)
Pt back to room from dialysis; pt alert and verbally responsive; denies any pain or discomfort. VSS: will continue to closely monitor. Delia Heady RN

## 2017-08-09 NOTE — Progress Notes (Signed)
Pt transported off unit to dialysis. P. Amo Alby Schwabe RN 

## 2017-08-09 NOTE — Consult Note (Signed)
   [facility] CM Inpatient Consult   08/09/2017  Aaron Long 10-24-1940 [id number]   Patient screened for potential [facility] services in the [facility]CO with Nephrology.  Patient's current Primary Care Provider Thyra Breed, is not an affiliate of [facility].   Went by to speak with patient and he was just getting back to the room from Hemodialysis.  No family at bedside and patient starting to eat lunch.   Please place a [facility] Care Management consult if needs are identified or for questions contact:   Natividad Brood, RN BSN [facility] Liaison  [phone] business mobile phone Toll free office [phone]

## 2017-08-09 NOTE — Progress Notes (Signed)
Pt droplet precaution discontinued per verbal order from MD. Delia Heady RN

## 2017-08-09 NOTE — Consult Note (Addendum)
Aaron Long Renal Consultation Note    Indication for Consultation:  Management of ESRD/hemodialysis; anemia, hypertension/volume and secondary hyperparathyroidism PCP: Long, Tammy MD  HPI: Aaron Long is a 77 y.o. male with ESRD on MWF at [facility] with DM, HTN, CAD, CHF, PAD s/p bilateral BKAs, CEA, prostate cancer, afib, DVT who was admitted with abdominal pain and malaise.  His last HD was Friday where he had presented below his EDW (which he had been for at least the last 3 treatments.  He essentially was kept even with pre BP of 126/73 P 100 and post BP 167/84 P 79.  He was afebrile  He had a prior admission mid June for abdominal pain with an equivocal Hida scan and was seen by general surgery with no indication for surgery.  He has been having some N and V for several weeks prior to admission.  Evaluation in the ED yesterday showed elevated LFTS (AST 201, ALT 189 with T bili 1.3, alk phos 143, US showed ho gallstones or cholecystitis  WBC was 7.5 plts 113 INR 1.73 , Trop 0-.12, BNP > 4500 Abdominal films showed nonobstructive gas pattern, multiple small right upper quad calcifications, possible AVF changes of both fem heads without collapse. CXR showed mild CHF and new posterior infiltrate.  He was admitted by primary with dx of HCAP  He has had some right hand swelling and tenderness with plan for arteriogram by [facility] 8/2 for possible ischemia. He has not had any N or V since admission. He denies any pain. He is not SOB and denies orthopnea or CP.  He has had some cough but not significantly productive. He denies fever or chills.  he makes a small amount of urine without dysuria.  His last BM was Friday. Today he feels the best he has in several weeks.   Past Medical History:  Diagnosis Date  . Abnormality of gait   . Anemia   . BPH (benign prostatic hypertrophy)   . Chronic combined systolic and diastolic CHF (congestive heart failure) ([place])   . Complication of  anesthesia    "he gets delirious"  . Constipation    takes Miralax daily  . Coronary artery disease    a. s/p NSTEMI/CABGx4 in 2007 - LIMA-LAD, SVG-optional diagonal, SVVG-OM, SVG-dRCA. b. Nuc 05/2011: nonischemic. c. all bypass grafts widely patent, 80% stenosis involving the grafted OM proximal to SVG anastomosis, which backfills the distal LCx, moderately elevated LVEDP, EF 25%.   . Diabetic peripheral neuropathy ([place])   . Dieulafoy lesion of rectum 05/03/2015  . Disorder of bone and cartilage, unspecified   . DVT (deep venous thrombosis) ([place])    a. Lower extremity DVT in 2013.  [name] ESRD on dialysis [facility])    M-W-F  . Gangrene of toe (HCC)    dry  . Hemorrhoids   . Hx of colonic polyps   . Hyperlipidemia   . Hypertension   . Hypertrophy of prostate without urinary obstruction and other lower urinary tract symptoms (LUTS)   . Ischemic cardiomyopathy    a. EF 40% in 2007. b. 51% by nuc 05/2011. c. EF 25% by cath and 35-40% by echo in 2018.  [name] Kidney stones   . Lower limb amputation, below knee   . Myocardial infarction ([place]) 2005  . Neuromuscular disorder ([place])    diabetic neuropathy  . NSTEMI (non-ST elevated myocardial infarction) ([place]) 04/2016  . PAD (peripheral artery disease) ([place])    a. R CEA 2007. b.  Hx L BKA in 2013. c. s/p LE angioplasty in 2014. d. Hx R BKA in 2014.  [name] PAF (paroxysmal atrial fibrillation) ([place])    a. dx 06/2016.  [name] Peripheral neuropathy   . Prostate cancer ([place]) 2010  . Secondary hyperparathyroidism ([place])    Secondary Hyperpara- Thyroidism, Renal  . Type II diabetes mellitus ([place])    Past Surgical History:  Procedure Laterality Date  . ABDOMINAL AORTAGRAM N/A 05/28/2011   Procedure: ABDOMINAL Maxcine Ham;  Surgeon: Elam Dutch, MD;  Location: [facility] CATH LAB;  Service: Cardiovascular;  Laterality: N/A;  . ABDOMINAL AORTAGRAM N/A 06/02/2012   Procedure: ABDOMINAL Maxcine Ham;  Surgeon: Elam Dutch, MD;  Location: [facility] CATH LAB;  Service: Cardiovascular;   Laterality: N/A;  . ABDOMINAL AORTAGRAM N/A 06/09/2012   Procedure: ABDOMINAL Maxcine Ham;  Surgeon: Elam Dutch, MD;  Location: [facility] CATH LAB;  Service: Cardiovascular;  Laterality: N/A;  . AMPUTATION  06/14/2011   Procedure: AMPUTATION DIGIT;  Surgeon: Elam Dutch, MD;  Location: [facility] OR;  Service: Vascular;  Laterality: Left;  Amputation Left fifth toe  . AMPUTATION  06/16/2011   Procedure: AMPUTATION BELOW KNEE;  Surgeon: Elam Dutch, MD;  Location: [place];  Service: Vascular;  Laterality: Left;  . AMPUTATION Right 06/12/2012   Procedure: AMPUTATION DIGIT;  Surgeon: Elam Dutch, MD;  Location: [place];  Service: Vascular;  Laterality: Right;  GREAT TOE  . AMPUTATION Right 08/08/2012   Procedure: AMPUTATION BELOW KNEE;  Surgeon: Elam Dutch, MD;  Location: [place];  Service: Vascular;  Laterality: Right;  . AV FISTULA PLACEMENT Right 04/19/2014   Procedure: RIGHT ARM ARTERIOVENOUS (AV) FISTULA CREATION;  Surgeon: Mal Misty, MD;  Location: [place];  Service: Vascular;  Laterality: Right;  . AV FISTULA PLACEMENT Left 12/12/2014   Procedure: BRACHIOCEPHALIC ARTERIOVENOUS (AV) FISTULA CREATION;  Surgeon: Mal Misty, MD;  Location: [place];  Service: Vascular;  Laterality: Left;  . CARDIAC CATHETERIZATION  05/28/11  . CARDIAC CATHETERIZATION N/A 10/04/2014   Procedure: Left Heart Cath and Coronary Angiography;  Surgeon: Sherren Mocha, MD;  Location: [place] CV LAB;  Service: Cardiovascular;  Laterality: N/A;  . CAROTID ENDARTERECTOMY Right 2005  . CATARACT EXTRACTION W/ INTRAOCULAR LENS  IMPLANT, BILATERAL Bilateral 2011  . COLONOSCOPY    . COLONOSCOPY N/A 05/03/2015   Procedure: COLONOSCOPY;  Surgeon: Gatha Mayer, MD;  Location: [place];  Service: Endoscopy;  Laterality: N/A;  . CORONARY ARTERY BYPASS GRAFT  2005   CABG X4  . ENDARTERECTOMY Left 10/17/2014   Procedure: ENDARTERECTOMY CAROTID;  Surgeon: Mal Misty, MD;  Location: [place];  Service: Vascular;  Laterality:  Left;  . I&D EXTREMITY  01/31/2012   Procedure: IRRIGATION AND DEBRIDEMENT EXTREMITY;  Surgeon: Elam Dutch, MD;  Location: [place];  Service: Vascular;  Laterality: Left;  I & D Left BKA   . INSERTION OF DIALYSIS CATHETER Right 04/19/2014   Procedure: INSERTION OF DIALYSIS CATHETER-INTERNAL JUGULAR;  Surgeon: Mal Misty, MD;  Location: [place];  Service: Vascular;  Laterality: Right;  . INSERTION OF DIALYSIS CATHETER N/A 11/06/2014   Procedure: INSERTION OF 23cm DIALYSIS CATHETER - right internal jugular;  Surgeon: Mal Misty, MD;  Location: [place];  Service: Vascular;  Laterality: N/A;  . KNEE CARTILAGE SURGERY Left 1964  . LEFT HEART CATH AND CORONARY ANGIOGRAPHY N/A 04/12/2016   Procedure: Left Heart Cath and Coronary Angiography;  Surgeon: Nelva Bush, MD;  Location: [place] CV LAB;  Service:  Cardiovascular;  Laterality: N/A;  . LIGATION OF ARTERIOVENOUS  FISTULA Right 11/06/2014   Procedure: LIGATION OF RADIOCEPHALIC ARTERIOVENOUS FISTULA;  Surgeon: Mal Misty, MD;  Location: [place];  Service: Vascular;  Laterality: Right;  . LIGATION OF COMPETING BRANCHES OF ARTERIOVENOUS FISTULA Right 06/19/2014   Procedure: Right Arm LIGATION OF COMPETING BRANCHES OF RADIOCEPHALIC ARTERIOVENOUS FISTULA;  Surgeon: Mal Misty, MD;  Location: [place];  Service: Vascular;  Laterality: Right;  . PROSTATECTOMY  2009   Family History  Problem Relation Age of Onset  . Hyperlipidemia Mother   . Hypertension Mother   . Cancer Mother   . Hyperlipidemia Father   . Hypertension Father   . Kidney disease Father   . Heart disease Sister   . Alzheimer's disease Sister   . Cancer Brother   . Other Sister   . Coronary artery disease Neg Hx   . Anesthesia problems Neg Hx   . Hypotension Neg Hx   . Malignant hyperthermia Neg Hx   . Pseudochol deficiency Neg Hx    Social History:  reports that he quit smoking about 26 years ago. His smoking use included cigarettes. He has a 30.00 pack-year  smoking history. He has never used smokeless tobacco. He reports that he drinks alcohol. He reports that he does not use drugs. Allergies  Allergen Reactions  . Oxycodone Other (See Comments)    Hallucinations   Prior to Admission medications   Medication Sig Start Date End Date Taking? Authorizing Provider  amLODipine (NORVASC) 5 MG tablet Take 1 tablet (5 mg total) by mouth daily. Take one tablet by mouth once daily Patient taking differently: Take 5 mg by mouth See admin instructions. Take one tablet (5 mg) by mouth on Sunday, Tuesday, Thursday, Saturday mornings, take one tablet on Monday, Wednesday, Friday after dialysis 04/13/16  Yes Rai, Ripudeep K, MD  aspirin EC 81 MG tablet Take 81 mg by mouth See admin instructions. Take one tablet (81 mg) by mouth on Sunday, Tuesday, Thursday, Saturday mornings, take one tablet on Monday, Wednesday, Friday after dialysis   Yes [provider]  calcium acetate (PHOSLO) 667 MG capsule Take 667 mg by mouth 3 (three) times daily with meals.  08/27/15  Yes [provider]  carvedilol (COREG) 6.25 MG tablet Take 1 tablet (6.25 mg total) by mouth 2 (two) times daily with a meal. 07/06/16  Yes [name], MD  ferric citrate (AURYXIA) 1 GM 210 MG(Fe) tablet Take 420 mg by mouth 3 (three) times daily with meals.   Yes [provider]  hydrALAZINE (APRESOLINE) 25 MG tablet Take 1 tablet (25 mg total) by mouth every 8 (eight) hours. 04/13/16  Yes Rai, Ripudeep K, MD  HYDROcodone-acetaminophen (NORCO/VICODIN) 5-325 MG tablet Take 1 tablet by mouth every 8 (eight) hours as needed (pain).  07/20/17  Yes [provider]  insulin aspart (NOVOLOG FLEXPEN) 100 UNIT/ML FlexPen Inject 2-3 Units into the skin 3 (three) times daily with meals as needed for high blood sugar (CBG >150).   Yes [provider]  Insulin Detemir (LEVEMIR FLEXTOUCH) 100 UNIT/ML Pen Inject 13 Units into the skin daily after breakfast.   Yes [provider]  isosorbide mononitrate (IMDUR) 30 MG 24 hr tablet Take 1 tablet (30 mg total) by mouth daily. Patient taking differently: Take 30 mg by mouth See admin instructions. Take one tablet (30 mg) by mouth on Sunday, Tuesday, Thursday, Saturday mornings, take one tablet on Monday, Wednesday, Friday after dialysis 04/14/16  Yes Rai, Ripudeep K, MD  polyethylene glycol (MIRALAX) packet Take 17 g by mouth daily. Patient taking differently: Take 17 g by mouth daily as needed (constipation).  04/05/16  Yes Estill Dooms, MD  pravastatin (PRAVACHOL) 40 MG tablet Take 40 mg by mouth See admin instructions. Take one tablet (40 mg) by mouth on Sunday, Tuesday, Thursday, Saturday mornings, take one tablet on Monday, Wednesday, Friday after dialysis   Yes [provider]  pregabalin (LYRICA) 75 MG capsule Take 75 mg by mouth See admin instructions. Take one tablet (75 mg) by mouth on Sunday, Tuesday, Thursday, Saturday mornings, take one tablet on Monday, Wednesday, Friday after dialysis   Yes [provider]  Promethazine-DM 6.25-15 MG/5ML SOLN Take 5 mLs by mouth 3 (three) times daily as needed (cough).  07/26/17  Yes [provider]  QUEtiapine (SEROQUEL) 25 MG tablet Take 1 tablet (25 mg total) by mouth daily. Patient taking differently: Take 25 mg by mouth daily as needed (anxiety).  04/05/16  Yes Estill Dooms, MD  warfarin (COUMADIN) 7.5 MG tablet Take 1 tablet (7.5 mg total) by mouth daily at 6 PM. 07/06/16  Yes [name], MD   Current Facility-Administered Medications  Medication Dose Route Frequency Provider Last Rate Last Dose  . 0.9 %  sodium chloride infusion  250 mL Intravenous PRN Doutova, Anastassia, MD      . 0.9 %  sodium chloride infusion  100 mL Intravenous PRN Alric Seton, PA-C      . 0.9 %  sodium chloride infusion  100 mL Intravenous PRN Alric Seton, PA-C      . acetaminophen (TYLENOL) tablet 650 mg  650 mg Oral Q6H PRN Toy [place],  MD       Or  . acetaminophen (TYLENOL) suppository 650 mg  650 mg Rectal Q6H PRN Doutova, Anastassia, MD      . alteplase (CATHFLO ACTIVASE) injection 2 mg  2 mg Intracatheter Once PRN Alric Seton, PA-C      . [START ON 08/10/2017] amLODipine (NORVASC) tablet 5 mg  5 mg Oral QHS Alric Seton, PA-C      . aspirin EC tablet 81 mg  81 mg Oral Daily Doutova, Anastassia, MD      . calcium acetate (PHOSLO) capsule 667 mg  667 mg Oral Q breakfast Toy [place], MD   667 mg at 08/09/17 0835  . carvedilol (COREG) tablet 6.25 mg  6.25 mg Oral BID WC Doutova, Anastassia, MD      . ceFEPIme (MAXIPIME) 1 g in sodium chloride 0.9 % 100 mL IVPB  1 g Intravenous Q24H Doutova, Anastassia, MD      . Chlorhexidine Gluconate Cloth 2 % PADS 6 each  6 each Topical Q0600 Alric Seton, PA-C      . [START ON 08/10/2017] cinacalcet (SENSIPAR) tablet 120 mg  120 mg Oral Once per day on Mon Wed Fri Alric Seton, PA-C      . [START ON 08/10/2017] doxercalciferol (HECTOROL) injection 6 mcg  6 mcg Intravenous Q M,W,F-HD Alric Seton, PA-C      . doxercalciferol (HECTOROL) injection 6 mcg  6 mcg Intravenous Once in dialysis Alric Seton, PA-C      . feeding supplement (ENSURE ENLIVE) (ENSURE ENLIVE) liquid 237 mL  237 mL Oral BID BM Doutova, Anastassia, MD   237 mL at 08/09/17 7591  . ferric citrate (AURYXIA) tablet 420 mg  420 mg Oral TID WC Doutova, Anastassia, MD   420 mg at 08/09/17 0835  .  hydrALAZINE (APRESOLINE) tablet 25 mg  25 mg Oral Q8H Doutova, Anastassia, MD   25 mg at 08/09/17 0533  . HYDROcodone-acetaminophen (NORCO/VICODIN) 5-325 MG per tablet 1-2 tablet  1-2 tablet Oral Q4H PRN Doutova, Anastassia, MD      . insulin aspart (novoLOG) injection 0-5 Units  0-5 Units Subcutaneous QHS Doutova, Anastassia, MD      . insulin aspart (novoLOG) injection 0-9 Units  0-9 Units Subcutaneous TID WC Toy [place], MD   2 Units at 08/09/17 0836  . insulin detemir (LEVEMIR) injection 13 Units  13  Units Subcutaneous Daily Doutova, Anastassia, MD      . isosorbide mononitrate (IMDUR) 24 hr tablet 30 mg  30 mg Oral Daily Doutova, Anastassia, MD      . lidocaine (PF) (XYLOCAINE) 1 % injection 5 mL  5 mL Intradermal PRN Alric Seton, PA-C      . lidocaine-prilocaine (EMLA) cream 1 application  1 application Topical PRN Alric Seton, PA-C      . multivitamin (RENA-VIT) tablet 1 tablet  1 tablet Oral QHS Alric Seton, PA-C      . ondansetron (ZOFRAN) tablet 4 mg  4 mg Oral Q6H PRN Toy [place], MD       Or  . ondansetron (ZOFRAN) injection 4 mg  4 mg Intravenous Q6H PRN Doutova, Anastassia, MD      . pentafluoroprop-tetrafluoroeth (GEBAUERS) aerosol 1 application  1 application Topical PRN Alric Seton, PA-C      . pneumococcal 23 valent vaccine (PNU-IMMUNE) injection 0.5 mL  0.5 mL Intramuscular Tomorrow-1000 Doutova, Anastassia, MD      . polyethylene glycol (MIRALAX / GLYCOLAX) packet 17 g  17 g Oral Daily Doutova, Anastassia, MD      . pravastatin (PRAVACHOL) tablet 40 mg  40 mg Oral Once per day on Sun Tue Thu Sat Toy Baker, MD      . pregabalin (LYRICA) capsule 75 mg  75 mg Oral BID Toy Baker, MD   75 mg at 08/08/17 2330  . sodium chloride flush (NS) 0.9 % injection 3 mL  3 mL Intravenous Q12H Doutova, Anastassia, MD   3 mL at 08/08/17 2333  . sodium chloride flush (NS) 0.9 % injection 3 mL  3 mL Intravenous PRN Toy Baker, MD      . Derrill Memo ON 08/10/2017] vancomycin (VANCOCIN) IVPB 750 mg/150 ml premix  750 mg Intravenous Q M,W,F-HD Doutova, Anastassia, MD      . warfarin (COUMADIN) tablet 7.5 mg  7.5 mg Oral ONCE-1800 Kris Mouton, [facility]      . Warfarin - Pharmacist Dosing Inpatient   Does not apply q1800 [facility]       Labs: Basic Metabolic Panel: Recent Labs  Lab 08/08/17 1143 08/09/17 0532  NA 141 137  K 4.8 5.2*  CL 98 95*  CO2 26 27  GLUCOSE 182* 208*  BUN 30* 40*  CREATININE 7.78* 8.71*  CALCIUM 8.8* 8.4*   PHOS  --  5.7*   Liver Function Tests: Recent Labs  Lab 08/08/17 1143 08/09/17 0532  AST 201* 414*  ALT 189* 456*  ALKPHOS 143* 141*  BILITOT 1.3* 1.1  PROT 7.3 7.0  ALBUMIN 3.8 3.6   Recent Labs  Lab 08/08/17 1143  LIPASE 41   No results for input(s): AMMONIA in the last 168 hours. CBC: Recent Labs  Lab 08/08/17 1143 08/09/17 0532  WBC 7.4 7.2  HGB 12.5* 11.8*  HCT 41.7 38.2*  MCV 105.3* 101.6*  PLT 113*  113*   Cardiac Enzymes: Recent Labs  Lab 08/08/17 2126  TROPONINI 0.12*   CBG: Recent Labs  Lab 08/08/17 2222 08/09/17 0731  GLUCAP 154* 186*   Iron Studies: No results for input(s): IRON, TIBC, TRANSFERRIN, FERRITIN in the last 72 hours. Studies/Results: Dg Chest 2 View  Result Date: 08/08/2017 CLINICAL DATA:  Two week history of cough. EXAM: CHEST - 2 VIEW COMPARISON:  Chest x-rays dated 06/30/2017 in 06/28/2016. FINDINGS: Stable cardiomegaly. Median sternotomy wires are stable in alignment. Atherosclerotic changes again noted at the aortic arch. Coarse interstitial markings again noted bilaterally suggesting some degree of chronic interstitial lung disease, although increased compared to previous studies suggesting superimposed edema. New dense opacity at the posterior costophrenic angle, uncertain side as it is only seen on the lateral view, pneumonia versus atelectasis. No pleural effusion or pneumothorax seen. IMPRESSION: 1. New dense opacity at the posterior costophrenic angle, pneumonia versus atelectasis, suspicious for pneumonia given the history of productive cough. 2. Probable bilateral interstitial edema, indicating mild CHF/volume overload, superimposed on chronic interstitial lung disease. 3. Stable cardiomegaly. 4. Aortic atherosclerosis. Electronically Signed   By: Franki Cabot M.D.   On: 08/08/2017 15:12   Dg Abd 1 View  Result Date: 08/08/2017 CLINICAL DATA:  Abdominal distension with nausea vomiting constipation EXAM: ABDOMEN - 1 VIEW  COMPARISON:  06/28/2016 FINDINGS: Nonobstructed gas pattern. Surgical clips in the pelvis. Vascular calcifications. Possible AVN changes of both femoral heads with no collapse. Multiple small calcifications in the right upper quadrant, could correspond to the history of gallstones IMPRESSION: 1. Nonobstructed gas pattern 2. Multiple small right upper quadrant calcifications may correspond to the history of gallstones 3. Possible AVN type changes of both femoral heads with no collapse on limited view of the pelvis Electronically Signed   By: Donavan Foil M.D.   On: 08/08/2017 21:20   US Abdomen Limited Ruq  Result Date: 08/08/2017 CLINICAL DATA:  Elevated LFTs. EXAM: ULTRASOUND ABDOMEN LIMITED RIGHT UPPER QUADRANT COMPARISON:  06/29/2017. FINDINGS: Gallbladder: Small gallstones are present, with the largest measuring 6 mm in single dimension. Gallbladder wall thickening, upper limits of normal, mildly increased at 3.3 mm. No pericholecystic fluid. Negative sonographic Murphy's sign. Common bile duct: Diameter: 3 mm, normal. Liver: No focal lesion identified. Within normal limits in parenchymal echogenicity. Portal vein is patent on color Doppler imaging with normal direction of blood flow towards the liver. IMPRESSION: Cholelithiasis, without gallbladder wall tenderness, but with stable mild gallbladder wall thickening of 3 mm. No biliary ductal dilatation to suggest an obstructive cause of LFTs. If further investigation desired, consider nuclear medicine hepatobiliary imaging. Electronically Signed   By: Staci Righter M.D.   On: 08/08/2017 16:28    ROS: As per HPI otherwise negative.  Physical Exam: Vitals:   08/09/17 0905 08/09/17 0930 08/09/17 1000 08/09/17 1030  BP: (!) 143/73 (!) 165/98 (!) 145/76 (!) 120/58  Pulse: 79 89 83 73  Resp:      Temp:      TempSrc:      SpO2:      Weight:      Height:         General: elderly male on HD NAD Head: NCAT sclera not icteric MMM Neck: Supple.   Lungs: dim BS bases, clear anteriorly. Breathing is unlabored. Heart: RRR with S1 S2.  Abdomen: soft NT + BS no RUQ tenderness Dxtremities: bilateral BKA without edema ; right hand warmer than left but notes pain, mild swelling and diminished mobility of fingers  except 5th finger  Neuro: A & O  X 3. Moves all extremities spontaneously. Psych:  Responds to questions appropriately with a normal affect. Dialysis Access: left  Upper AVF Qb 400 -(unable to get to Rx 450)- left hand cool on HD  Dialysis Orders: MWF AF 4 hr EDW 73 450/800 left upper AVF 2 K 2.25 Ca heparin none, profile 2 sensipar 120 q HD, Hectorol 6 Mircera 50 q 4 weeks - last dose 7/10 var Na Recent labs:  hgb 12 21% sat ferritin 1200 INR 1.88 plts 135 iPTH 273 Ca/P ok - no LFTs available  Assessment/Plan: 1. HCAP - empiric Vanc/Maxipime and -per primary 2. ESRD -  MWF - HD today K 5.2 off schedule - HD again tomorrow back on MWF schedule 3. Hypertension/volume  - titrate EDW while here - continue outpt meds- may need to reduce on norvasc 5, hydralazine 25 tid, coreg 6.25 bid- BP 120s on HD goal 3 L yes we should 4. Anemia  - hgb 1.25 - no ESA  Indicated - last dose Mircera 50 on 7/10  5. Metabolic bone disease -  Continue VDRA/binders/sensipar  6. Nutrition - renal carb mod diet/vits/unintentional weight loss - EDW to be lowered 7. afib - on BB coumadin 8. PAD s/p bilateral BKA, CEA; right hand ischemia - followed by Dr Oneida Alar - has planned arch aortogram and RUE arteriogram 8/2 9. Hx prostate cancer 10. CAD increased trop - prob demand ischemia on BB statin 11. Hx DVT - on coumadin 12. DM - per primary 13. Elevated LFT -dramatic increase from yesterday 200 >400s - continue to trend - per primary  Myriam Jacobson, PA-C [place] [phone] 08/09/2017, 11:01 AM   Patient seen and examined, agree with above note with above modifications. Known to me..... Main issue of late has been right hand pain  and numbness-in looking at things it seems has lost weight and needs lowering of EDW and lowering of BP meds - that may be what is reading as pna (pulm edema)  Not very symptomatic from elevated LFTs at this time  Corliss Parish, MD 08/09/2017

## 2017-08-09 NOTE — Procedures (Signed)
Patient was seen on dialysis and the procedure was supervised.  BFR 400  Via AVF BP is  120/58.   Patient appears to be tolerating treatment well- running off schedule due to admit late yesterday - says he feels well   Aaron Long A 08/09/2017

## 2017-08-09 NOTE — Progress Notes (Signed)
[place] for warfarin Indication: atrial fibrillation  Allergies  Allergen Reactions  . Oxycodone Other (See Comments)    Hallucinations    Patient Measurements: Height: 5\' 8"  (172.7 cm) Weight: 160 lb 0.9 oz (72.6 kg) IBW/kg (Calculated) : 68.4  Vital Signs: Temp: 98 F (36.7 C) (07/30 0355) Temp Source: Oral (07/30 0355) BP: 127/94 (07/30 0533) Pulse Rate: 83 (07/30 0355)  Labs: Recent Labs    08/08/17 1143 08/08/17 2025 08/08/17 2126 08/09/17 0532  HGB 12.5*  --   --  11.8*  HCT 41.7  --   --  38.2*  PLT 113*  --   --  113*  LABPROT  --  20.1*  --  21.5*  INR  --  1.73  --  1.88  CREATININE 7.78*  --   --  8.71*  TROPONINI  --   --  0.12*  --     Estimated Creatinine Clearance: 6.9 mL/min (A) (by C-G formula based on SCr of 8.71 mg/dL (H)).   Medical History: Past Medical History:  Diagnosis Date  . Abnormality of gait   . Anemia   . BPH (benign prostatic hypertrophy)   . Chronic combined systolic and diastolic CHF (congestive heart failure) ([place])   . Complication of anesthesia    "he gets delirious"  . Constipation    takes Miralax daily  . Coronary artery disease    a. s/p NSTEMI/CABGx4 in 2007 - LIMA-LAD, SVG-optional diagonal, SVVG-OM, SVG-dRCA. b. Nuc 05/2011: nonischemic. c. all bypass grafts widely patent, 80% stenosis involving the grafted OM proximal to SVG anastomosis, which backfills the distal LCx, moderately elevated LVEDP, EF 25%.   . Diabetic peripheral neuropathy ([place])   . Dieulafoy lesion of rectum 05/03/2015  . Disorder of bone and cartilage, unspecified   . DVT (deep venous thrombosis) ([place])    a. Lower extremity DVT in 2013.  [name] ESRD on dialysis [facility])    M-W-F  . Gangrene of toe (HCC)    dry  . Hemorrhoids   . Hx of colonic polyps   . Hyperlipidemia   . Hypertension   . Hypertrophy of prostate without urinary obstruction and other lower urinary tract symptoms (LUTS)   . Ischemic  cardiomyopathy    a. EF 40% in 2007. b. 51% by nuc 05/2011. c. EF 25% by cath and 35-40% by echo in 2018.  [name] Kidney stones   . Lower limb amputation, below knee   . Myocardial infarction ([place]) 2005  . Neuromuscular disorder ([place])    diabetic neuropathy  . NSTEMI (non-ST elevated myocardial infarction) ([place]) 04/2016  . PAD (peripheral artery disease) ([place])    a. R CEA 2007. b. Hx L BKA in 2013. c. s/p LE angioplasty in 2014. d. Hx R BKA in 2014.  [name] PAF (paroxysmal atrial fibrillation) ([place])    a. dx 06/2016.  [name] Peripheral neuropathy   . Prostate cancer ([place]) 2010  . Secondary hyperparathyroidism ([place])    Secondary Hyperpara- Thyroidism, Renal  . Type II diabetes mellitus ([place])      Assessment: 29 yoM on warfarin PTA for AFib and remote hx DVT admitted with abdominal pain. INR on admit subtherapeutic at 1.73. -INR up to 1.88 and LFTs up to 414/456   *PTA Dose = 7.5mg  PO daily  Goal of Therapy:  INR 2-3 Monitor platelets by anticoagulation protocol: Yes   Plan:  -Warfarin 7.5mg  PO x1 tonight -Daily INR  Hildred Laser, PharmD Clinical Pharmacist Please check  AMION for all [facility] Pharmacy numbers 08/09/2017

## 2017-08-09 NOTE — Progress Notes (Signed)
BP lower than normal, NP paged.  Found patient without oxygen on- oxygen in 80's, placed nasal cannula and educated patient on importance of keeping it on. O2 100% on 3L.

## 2017-08-09 NOTE — Plan of Care (Signed)

## 2017-08-09 NOTE — Progress Notes (Signed)
Report given off to dialysis RN and awaiting for pt to be picked up to dialysis. Delia Heady RN

## 2017-08-09 NOTE — Progress Notes (Signed)
Respiratory panel negative, patient has not coughed and is unable to give sputum sample at the moment.

## 2017-08-10 DIAGNOSIS — I482 Chronic atrial fibrillation: Secondary | ICD-10-CM

## 2017-08-10 DIAGNOSIS — N186 End stage renal disease: Secondary | ICD-10-CM

## 2017-08-10 DIAGNOSIS — Z992 Dependence on renal dialysis: Secondary | ICD-10-CM

## 2017-08-10 DIAGNOSIS — J189 Pneumonia, unspecified organism: Principal | ICD-10-CM

## 2017-08-10 DIAGNOSIS — R945 Abnormal results of liver function studies: Secondary | ICD-10-CM

## 2017-08-10 DIAGNOSIS — D631 Anemia in chronic kidney disease: Secondary | ICD-10-CM

## 2017-08-10 LAB — BASIC METABOLIC PANEL
Anion gap: 11 (ref 5–15)
BUN: 26 mg/dL — AB (ref 8–23)
CALCIUM: 8.5 mg/dL — AB (ref 8.9–10.3)
CHLORIDE: 98 mmol/L (ref 98–111)
CO2: 32 mmol/L (ref 22–32)
CREATININE: 5.94 mg/dL — AB (ref 0.61–1.24)
GFR calc Af Amer: 9 mL/min — ABNORMAL LOW (ref >60)
GFR calc non Af Amer: 8 mL/min — ABNORMAL LOW (ref >60)
GLUCOSE: 86 mg/dL (ref 70–99)
Potassium: 4 mmol/L (ref 3.5–5.1)
Sodium: 141 mmol/L (ref 135–145)

## 2017-08-10 LAB — COMPREHENSIVE METABOLIC PANEL
ALT: 278 U/L — ABNORMAL HIGH (ref 0–44)
AST: 158 U/L — ABNORMAL HIGH (ref 15–41)
Albumin: 2.9 g/dL — ABNORMAL LOW (ref 3.5–5.0)
Alkaline Phosphatase: 131 U/L — ABNORMAL HIGH (ref 38–126)
Anion gap: 10 (ref 5–15)
BUN: 29 mg/dL — ABNORMAL HIGH (ref 8–23)
CO2: 31 mmol/L (ref 22–32)
Calcium: 8.5 mg/dL — ABNORMAL LOW (ref 8.9–10.3)
Chloride: 99 mmol/L (ref 98–111)
Creatinine, Ser: 6.04 mg/dL — ABNORMAL HIGH (ref 0.61–1.24)
GFR calc Af Amer: 9 mL/min — ABNORMAL LOW (ref >60)
GFR calc non Af Amer: 8 mL/min — ABNORMAL LOW (ref >60)
Glucose, Bld: 82 mg/dL (ref 70–99)
Potassium: 4 mmol/L (ref 3.5–5.1)
Sodium: 140 mmol/L (ref 135–145)
Total Bilirubin: 0.8 mg/dL (ref 0.3–1.2)
Total Protein: 5.7 g/dL — ABNORMAL LOW (ref 6.5–8.1)

## 2017-08-10 LAB — CBC
HEMATOCRIT: 36.9 % — AB (ref 39.0–52.0)
Hemoglobin: 11.3 g/dL — ABNORMAL LOW (ref 13.0–17.0)
MCH: 31.6 pg (ref 26.0–34.0)
MCHC: 30.6 g/dL (ref 30.0–36.0)
MCV: 103.1 fL — AB (ref 78.0–100.0)
Platelets: 124 10*3/uL — ABNORMAL LOW (ref 150–400)
RBC: 3.58 MIL/uL — ABNORMAL LOW (ref 4.22–5.81)
RDW: 16.1 % — AB (ref 11.5–15.5)
WBC: 6 10*3/uL (ref 4.0–10.5)

## 2017-08-10 LAB — HEPATIC FUNCTION PANEL
ALT: 292 U/L — ABNORMAL HIGH (ref 0–44)
AST: 170 U/L — ABNORMAL HIGH (ref 15–41)
Albumin: 3 g/dL — ABNORMAL LOW (ref 3.5–5.0)
Alkaline Phosphatase: 136 U/L — ABNORMAL HIGH (ref 38–126)
BILIRUBIN INDIRECT: 0.7 mg/dL (ref 0.3–0.9)
Bilirubin, Direct: 0.2 mg/dL (ref 0.0–0.2)
TOTAL PROTEIN: 6.2 g/dL — AB (ref 6.5–8.1)
Total Bilirubin: 0.9 mg/dL (ref 0.3–1.2)

## 2017-08-10 LAB — GLUCOSE, CAPILLARY
Glucose-Capillary: 112 mg/dL — ABNORMAL HIGH (ref 70–99)
Glucose-Capillary: 202 mg/dL — ABNORMAL HIGH (ref 70–99)
Glucose-Capillary: 147 mg/dL — ABNORMAL HIGH (ref 70–99)

## 2017-08-10 LAB — PROTIME-INR
INR: 1.91
Prothrombin Time: 21.8 seconds — ABNORMAL HIGH (ref 11.4–15.2)

## 2017-08-10 LAB — PHOSPHORUS: Phosphorus: 3.1 mg/dL (ref 2.5–4.6)

## 2017-08-10 MED ORDER — SODIUM CHLORIDE 0.9 % IV SOLN: 100.0000 mL | INTRAVENOUS | Status: DC | PRN

## 2017-08-10 MED ORDER — HEPARIN SODIUM (PORCINE) 1000 UNIT/ML DIALYSIS
1000.0000 [IU] | INTRAMUSCULAR | Status: DC | PRN
  Filled 2017-08-10: qty 1

## 2017-08-10 MED ORDER — LIDOCAINE HCL (PF) 1 % IJ SOLN: 5.0000 mL | INTRAMUSCULAR | Status: DC | PRN

## 2017-08-10 MED ORDER — PENTAFLUOROPROP-TETRAFLUOROETH EX AERO: 1.0000 "application " | INHALATION_SPRAY | CUTANEOUS | Status: DC | PRN

## 2017-08-10 MED ORDER — WARFARIN SODIUM 7.5 MG PO TABS
7.5000 mg | ORAL_TABLET | Freq: Once | ORAL | Status: AC
  Administered 2017-08-10: 7.5 mg via ORAL
  Filled 2017-08-10: qty 1

## 2017-08-10 MED ORDER — ALTEPLASE 2 MG IJ SOLR: 2.0000 mg | Freq: Once | INTRAMUSCULAR | Status: DC | PRN

## 2017-08-10 MED ORDER — DOXERCALCIFEROL 4 MCG/2ML IV SOLN
INTRAVENOUS | Status: AC
  Administered 2017-08-10: 6 ug via INTRAVENOUS
  Filled 2017-08-10: qty 4

## 2017-08-10 MED ORDER — LIDOCAINE-PRILOCAINE 2.5-2.5 % EX CREA
1.0000 "application " | TOPICAL_CREAM | CUTANEOUS | Status: DC | PRN
  Filled 2017-08-10: qty 5

## 2017-08-10 NOTE — Progress Notes (Signed)
[place] for warfarin Indication: atrial fibrillation  Allergies  Allergen Reactions  . Oxycodone Other (See Comments)    Hallucinations    Patient Measurements: Height: 5\' 8"  (172.7 cm) Weight: 151 lb 0.2 oz (68.5 kg) IBW/kg (Calculated) : 68.4  Vital Signs: Temp: 97.8 F (36.6 C) (07/31 1230) Temp Source: Oral (07/31 1230) BP: 113/56 (07/31 1230) Pulse Rate: 63 (07/31 1230)  Labs: Recent Labs    08/08/17 1143 08/08/17 2025 08/08/17 2126 08/09/17 0532 08/10/17 0540 08/10/17 0729  HGB 12.5*  --   --  11.8* 11.3*  --   HCT 41.7  --   --  38.2* 36.9*  --   PLT 113*  --   --  113* 124*  --   LABPROT  --  20.1*  --  21.5* 21.8*  --   INR  --  1.73  --  1.88 1.91  --   CREATININE 7.78*  --   --  8.71* 5.94* 6.04*  TROPONINI  --   --  0.12*  --   --   --     Estimated Creatinine Clearance: 9.9 mL/min (A) (by C-G formula based on SCr of 6.04 mg/dL (H)).   Medical History: Past Medical History:  Diagnosis Date  . Abnormality of gait   . Anemia   . BPH (benign prostatic hypertrophy)   . Chronic combined systolic and diastolic CHF (congestive heart failure) ([place])   . Complication of anesthesia    "he gets delirious"  . Constipation    takes Miralax daily  . Coronary artery disease    a. s/p NSTEMI/CABGx4 in 2007 - LIMA-LAD, SVG-optional diagonal, SVVG-OM, SVG-dRCA. b. Nuc 05/2011: nonischemic. c. all bypass grafts widely patent, 80% stenosis involving the grafted OM proximal to SVG anastomosis, which backfills the distal LCx, moderately elevated LVEDP, EF 25%.   . Diabetic peripheral neuropathy ([place])   . Dieulafoy lesion of rectum 05/03/2015  . Disorder of bone and cartilage, unspecified   . DVT (deep venous thrombosis) ([place])    a. Lower extremity DVT in 2013.  [name] ESRD on dialysis [facility])    M-W-F  . Gangrene of toe (HCC)    dry  . Hemorrhoids   . Hx of colonic polyps   . Hyperlipidemia   . Hypertension   . Hypertrophy of  prostate without urinary obstruction and other lower urinary tract symptoms (LUTS)   . Ischemic cardiomyopathy    a. EF 40% in 2007. b. 51% by nuc 05/2011. c. EF 25% by cath and 35-40% by echo in 2018.  [name] Kidney stones   . Lower limb amputation, below knee   . Myocardial infarction ([place]) 2005  . Neuromuscular disorder ([place])    diabetic neuropathy  . NSTEMI (non-ST elevated myocardial infarction) ([place]) 04/2016  . PAD (peripheral artery disease) ([place])    a. R CEA 2007. b. Hx L BKA in 2013. c. s/p LE angioplasty in 2014. d. Hx R BKA in 2014.  [name] PAF (paroxysmal atrial fibrillation) ([place])    a. dx 06/2016.  [name] Peripheral neuropathy   . Prostate cancer ([place]) 2010  . Secondary hyperparathyroidism ([place])    Secondary Hyperpara- Thyroidism, Renal  . Type II diabetes mellitus ([place])      Assessment: 10 yoM on warfarin PTA for AFib and remote hx DVT admitted with abdominal pain. INR on admit subtherapeutic at 1.73. -INR up to 1.91  *PTA Dose = 7.5mg  PO daily  Goal of Therapy:  INR  2-3 Monitor platelets by anticoagulation protocol: Yes   Plan:  -Warfarin 7.5mg  PO x1 tonight -Daily INR  Alanda Slim, PharmD Clinical Pharmacist Please check AMION for all [facility] Pharm[facility] numbers 08/10/2017

## 2017-08-10 NOTE — Progress Notes (Signed)
Patient plans to go home via non emergent ambulance PTAR; home address verified; B [place] Punt rn,mha,bsn [phone]

## 2017-08-10 NOTE — Progress Notes (Signed)
Pt back to the unit from dialysis; alert and verbally responsive. VSS; pt O2 was 100% on 2L; weaned off and sat remains in 94-97% RA. Pt denies any distress or discomfort for now. Will continue closely monitor. Delia Heady RN

## 2017-08-10 NOTE — Progress Notes (Signed)
Report received from outgoing night shift RN; pt unavailable on unit as pt transported off to dialysis during shift change. Delia Heady RN

## 2017-08-10 NOTE — Progress Notes (Signed)
Subjective:  Seen on HD- is under his EDW- felt better with volume off - still req O2   Objective Vital signs in last 24 hours: Vitals:   08/10/17 0613 08/10/17 0710 08/10/17 0735 08/10/17 0800  BP: 114/62 123/63 118/61 (P) 109/70  Pulse:  64 63 (P) 65  Resp:  18 17 (P) 14  Temp:  (!) 96.9 F (36.1 C)    TempSrc:  Oral    SpO2:  95%    Weight:  71.6 kg (157 lb 13.6 oz)    Height:       Weight change: 0.4 kg (14.1 oz)  Intake/Output Summary (Last 24 hours) at 08/10/2017 0904 Last data filed at 08/09/2017 2200 Gross per 24 hour  Intake 240 ml  Output 2502 ml  Net -2262 ml    Dialysis Orders: MWF AF 4 hr EDW 73 450/800 left upper AVF 2 K 2.25 Ca heparin none, profile 2 sensipar 120 q HD, Hectorol 6 Mircera 50 q 4 weeks - last dose 7/10 var Na Recent labs:  hgb 12 21% sat ferritin 1200 INR 1.88 plts 135 iPTH 273 Ca/P ok - no LFTs available  Assessment/Plan: 1. HCAP - empiric Vanc/Maxipime and -per primary- actually now ABX stopped which I agree with 2. ESRD -  MWF - HD today on schedule - goal of 3000 today- EDW is going to be much lower after hosp 3. Hypertension/volume  - titrate EDW down while here -stop norvasc and hydralazine (I think I may have stopped as OP )  Cont low dose coreg 4. Anemia  - hgb 1.25 - no ESA  Indicated - last dose Mircera 50 on 7/10  5. Metabolic bone disease -  Continue VDRA/binders/sensipar  6. Nutrition - renal carb mod diet/vits/unintentional weight loss - EDW to be lowered 7. afib - on BB coumadin 8. PAD s/p bilateral BKA, CEA; right hand ischemia - followed by Dr Oneida Alar - has planned arch aortogram and RUE arteriogram 8/2 9. Hx prostate cancer 10. CAD increased trop - prob demand ischemia on BB statin 11. Hx DVT - on coumadin 12. DM - per primary 13. Elevated LFT -dramatic increase during hosp - continue to trend - per primary- trending down   fyi- We may be able to handle this as an OP- cont to titrate weight down.  If is still hypxic after HD  today may need temporary home O2 but feeling much better      Karrie Fluellen A    Labs: Basic Metabolic Panel: Recent Labs  Lab 08/08/17 1143 08/09/17 0532 08/10/17 0540  NA 141 137 141  K 4.8 5.2* 4.0  CL 98 95* 98  CO2 26 27 32  GLUCOSE 182* 208* 86  BUN 30* 40* 26*  CREATININE 7.78* 8.71* 5.94*  CALCIUM 8.8* 8.4* 8.5*  PHOS  --  5.7*  --    Liver Function Tests: Recent Labs  Lab 08/08/17 1143 08/09/17 0532 08/10/17 0540  AST 201* 414* 170*  ALT 189* 456* 292*  ALKPHOS 143* 141* 136*  BILITOT 1.3* 1.1 0.9  PROT 7.3 7.0 6.2*  ALBUMIN 3.8 3.6 3.0*   Recent Labs  Lab 08/08/17 1143  LIPASE 41   No results for input(s): AMMONIA in the last 168 hours. CBC: Recent Labs  Lab 08/08/17 1143 08/09/17 0532 08/10/17 0540  WBC 7.4 7.2 6.0  HGB 12.5* 11.8* 11.3*  HCT 41.7 38.2* 36.9*  MCV 105.3* 101.6* 103.1*  PLT 113* 113* 124*   Cardiac Enzymes: Recent Labs  Lab 08/08/17 2126  TROPONINI 0.12*   CBG: Recent Labs  Lab 08/08/17 2222 08/09/17 0731 08/09/17 1140 08/09/17 1646 08/09/17 2125  GLUCAP 154* 186* 111* 205* 122*    Iron Studies: No results for input(s): IRON, TIBC, TRANSFERRIN, FERRITIN in the last 72 hours. Studies/Results: Dg Chest 2 View  Result Date: 08/08/2017 CLINICAL DATA:  Two week history of cough. EXAM: CHEST - 2 VIEW COMPARISON:  Chest x-rays dated 06/30/2017 in 06/28/2016. FINDINGS: Stable cardiomegaly. Median sternotomy wires are stable in alignment. Atherosclerotic changes again noted at the aortic arch. Coarse interstitial markings again noted bilaterally suggesting some degree of chronic interstitial lung disease, although increased compared to previous studies suggesting superimposed edema. New dense opacity at the posterior costophrenic angle, uncertain side as it is only seen on the lateral view, pneumonia versus atelectasis. No pleural effusion or pneumothorax seen. IMPRESSION: 1. New dense opacity at the posterior  costophrenic angle, pneumonia versus atelectasis, suspicious for pneumonia given the history of productive cough. 2. Probable bilateral interstitial edema, indicating mild CHF/volume overload, superimposed on chronic interstitial lung disease. 3. Stable cardiomegaly. 4. Aortic atherosclerosis. Electronically Signed   By: Franki Cabot M.D.   On: 08/08/2017 15:12   Dg Abd 1 View  Result Date: 08/08/2017 CLINICAL DATA:  Abdominal distension with nausea vomiting constipation EXAM: ABDOMEN - 1 VIEW COMPARISON:  06/28/2016 FINDINGS: Nonobstructed gas pattern. Surgical clips in the pelvis. Vascular calcifications. Possible AVN changes of both femoral heads with no collapse. Multiple small calcifications in the right upper quadrant, could correspond to the history of gallstones IMPRESSION: 1. Nonobstructed gas pattern 2. Multiple small right upper quadrant calcifications may correspond to the history of gallstones 3. Possible AVN type changes of both femoral heads with no collapse on limited view of the pelvis Electronically Signed   By: Donavan Foil M.D.   On: 08/08/2017 21:20   US Abdomen Limited Ruq  Result Date: 08/08/2017 CLINICAL DATA:  Elevated LFTs. EXAM: ULTRASOUND ABDOMEN LIMITED RIGHT UPPER QUADRANT COMPARISON:  06/29/2017. FINDINGS: Gallbladder: Small gallstones are present, with the largest measuring 6 mm in single dimension. Gallbladder wall thickening, upper limits of normal, mildly increased at 3.3 mm. No pericholecystic fluid. Negative sonographic Murphy's sign. Common bile duct: Diameter: 3 mm, normal. Liver: No focal lesion identified. Within normal limits in parenchymal echogenicity. Portal vein is patent on color Doppler imaging with normal direction of blood flow towards the liver. IMPRESSION: Cholelithiasis, without gallbladder wall tenderness, but with stable mild gallbladder wall thickening of 3 mm. No biliary ductal dilatation to suggest an obstructive cause of LFTs. If further  investigation desired, consider nuclear medicine hepatobiliary imaging. Electronically Signed   By: Staci Righter M.D.   On: 08/08/2017 16:28   Medications: Infusions: . sodium chloride    . sodium chloride      Scheduled Medications: . amLODipine  5 mg Oral QHS  . aspirin EC  81 mg Oral Daily  . calcium acetate  667 mg Oral Q breakfast  . carvedilol  6.25 mg Oral BID WC  . Chlorhexidine Gluconate Cloth  6 each Topical Q0600  . cinacalcet  120 mg Oral Once per day on Mon Wed Fri  . doxercalciferol  6 mcg Intravenous Q M,W,F-HD  . feeding supplement (NEPRO CARB STEADY)  237 mL Oral BID BM  . ferric citrate  420 mg Oral TID WC  . hydrALAZINE  25 mg Oral Q8H  . insulin aspart  0-5 Units Subcutaneous QHS  . insulin aspart  0-9 Units Subcutaneous  TID WC  . insulin detemir  13 Units Subcutaneous Daily  . isosorbide mononitrate  30 mg Oral Daily  . multivitamin  1 tablet Oral QHS  . polyethylene glycol  17 g Oral Daily  . pravastatin  40 mg Oral Once per day on Sun Tue Thu Sat  . pregabalin  75 mg Oral BID  . Warfarin - Pharmacist Dosing Inpatient   Does not apply q1800    have reviewed scheduled and prn medications.  Physical Exam: General: NAD- req O2 Heart: RRR Lungs: dec BS at bases Abdomen: slightly distended- feeling better Extremities: pitting edema Dialysis Access: left upper AVF - accessed    08/10/2017,9:04 AM  LOS: 2 days

## 2017-08-10 NOTE — Progress Notes (Signed)
Pt remained stable on RA with no distress or discomfort noted. Pt O2 remained greater than 92% on RA. Pt discharge education and instructions completed with pt at bedside; pt voices understanding and denies any questions. Pt IV and telemetry removed; pt discharge home and awaiting on PTAR to pick to be transported off to disposition. Case manager Hassan Rowan called and arranged PTAR for pt. Delia Heady RN

## 2017-08-10 NOTE — Procedures (Signed)
Patient was seen on dialysis and the procedure was supervised.  BFR 450  Via AVF BP is  107/60.   Patient appears to be tolerating treatment well  Aaron Long A 08/10/2017

## 2017-08-10 NOTE — Discharge Summary (Addendum)
Aaron Long, is a 77 y.o. male  DOB [birth year]  MRN [id number].  Admission date:  08/08/2017  Admitting Physician  Toy Baker, MD  Discharge Date:  08/10/2017   Primary MD  Bartholome Bill, MD   Addendum: No significant updates in discharge summary dictated yesterday, patient was not discharged secondary to here with her condition in his area, so he was kept overnight.  Summary updated 08/11/2017.   Recommendations for primary care physician for things to follow:  -  Please check CBC, BMP, LFTs in 1-2 week to ensure continued improvement of liver function -  Please repeat two view chest x-ray in 2-3 weeks to ensure resolution if retro cardiac opacity after adjustment of dry weight with dialysis.   Admission Diagnosis  Abdominal distention [R14.0] Elevated LFTs [R94.5] Community acquired pneumonia of right lung, unspecified part of lung [J18.9]   Discharge Diagnosis  Abdominal distention [R14.0] Elevated LFTs [R94.5] Community acquired pneumonia of right lung, unspecified part of lung [J18.9]    Active Problems:   HTN (hypertension)   CAD (coronary artery disease)   Anemia in chronic renal disease   GERD (gastroesophageal reflux disease)   Hyperlipidemia LDL goal <70   DM type 2, uncontrolled, with renal complications ([place])   Status post bilateral below knee amputation (HCC)   ESRD on dialysis (HCC)   CHF (congestive heart failure) (HCC)   Pulmonary edema   Atrial fibrillation (HCC)   Upper abdominal pain   Elevated LFTs   Cholelithiasis   Abdominal distention   Generalized abdominal pain   Hypervolemia      Past Medical History:  Diagnosis Date  . Abnormality of gait   . Anemia   . BPH (benign prostatic hypertrophy)   . Chronic combined systolic and diastolic CHF (congestive heart failure) ([place])   . Complication of anesthesia    "he gets delirious"  .  Constipation    takes Miralax daily  . Coronary artery disease    a. s/p NSTEMI/CABGx4 in 2007 - LIMA-LAD, SVG-optional diagonal, SVVG-OM, SVG-dRCA. b. Nuc 05/2011: nonischemic. c. all bypass grafts widely patent, 80% stenosis involving the grafted OM proximal to SVG anastomosis, which backfills the distal LCx, moderately elevated LVEDP, EF 25%.   . Diabetic peripheral neuropathy ([place])   . Dieulafoy lesion of rectum 05/03/2015  . Disorder of bone and cartilage, unspecified   . DVT (deep venous thrombosis) ([place])    a. Lower extremity DVT in 2013.  [name] ESRD on dialysis [facility])    M-W-F  . Gangrene of toe (HCC)    dry  . Hemorrhoids   . Hx of colonic polyps   . Hyperlipidemia   . Hypertension   . Hypertrophy of prostate without urinary obstruction and other lower urinary tract symptoms (LUTS)   . Ischemic cardiomyopathy    a. EF 40% in 2007. b. 51% by nuc 05/2011. c. EF 25% by cath and 35-40% by echo in 2018.  [name] Kidney stones   . Lower limb amputation, below knee   .  Myocardial infarction ([place]) 2005  . Neuromuscular disorder ([place])    diabetic neuropathy  . NSTEMI (non-ST elevated myocardial infarction) ([place]) 04/2016  . PAD (peripheral artery disease) ([place])    a. R CEA 2007. b. Hx L BKA in 2013. c. s/p LE angioplasty in 2014. d. Hx R BKA in 2014.  [name] PAF (paroxysmal atrial fibrillation) ([place])    a. dx 06/2016.  [name] Peripheral neuropathy   . Prostate cancer ([place]) 2010  . Secondary hyperparathyroidism ([place])    Secondary Hyperpara- Thyroidism, Renal  . Type II diabetes mellitus ([place])     Past Surgical History:  Procedure Laterality Date  . ABDOMINAL AORTAGRAM N/A 05/28/2011   Procedure: ABDOMINAL Maxcine Ham;  Surgeon: Elam Dutch, MD;  Location: [facility] C[facility] LAB;  Service: Cardiovascular;  Laterality: N/A;  . ABDOMINAL AORTAGRAM N/A 06/02/2012   Procedure: ABDOMINAL Maxcine Ham;  Surgeon: Elam Dutch, MD;  Location: [facility] CATH LAB;  Service: Cardiovascular;  Laterality: N/A;  . ABDOMINAL  AORTAGRAM N/A 06/09/2012   Procedure: ABDOMINAL Maxcine Ham;  Surgeon: Elam Dutch, MD;  Location: [facility] CATH LAB;  Service: Cardiovascular;  Laterality: N/A;  . AMPUTATION  06/14/2011   Procedure: AMPUTATION DIGIT;  Surgeon: Elam Dutch, MD;  Location: [place] OR;  Service: Vascular;  Laterality: Left;  Amputation Left fifth toe  . AMPUTATION  06/16/2011   Procedure: AMPUTATION BELOW KNEE;  Surgeon: Elam Dutch, MD;  Location: [place];  Service: Vascular;  Laterality: Left;  . AMPUTATION Right 06/12/2012   Procedure: AMPUTATION DIGIT;  Surgeon: Elam Dutch, MD;  Location: [place];  Service: Vascular;  Laterality: Right;  GREAT TOE  . AMPUTATION Right 08/08/2012   Procedure: AMPUTATION BELOW KNEE;  Surgeon: Elam Dutch, MD;  Location: [place];  Service: Vascular;  Laterality: Right;  . AV FISTULA PLACEMENT Right 04/19/2014   Procedure: RIGHT ARM ARTERIOVENOUS (AV) FISTULA CREATION;  Surgeon: Mal Misty, MD;  Location: [place];  Service: Vascular;  Laterality: Right;  . AV FISTULA PLACEMENT Left 12/12/2014   Procedure: BRACHIOCEPHALIC ARTERIOVENOUS (AV) FISTULA CREATION;  Surgeon: Mal Misty, MD;  Location: [place];  Service: Vascular;  Laterality: Left;  . CARDIAC CATHETERIZATION  05/28/11  . CARDIAC CATHETERIZATION N/A 10/04/2014   Procedure: Left Heart Cath and Coronary Angiography;  Surgeon: Sherren Mocha, MD;  Location: [place] CV LAB;  Service: Cardiovascular;  Laterality: N/A;  . CAROTID ENDARTERECTOMY Right 2005  . CATARACT EXTRACTION W/ INTRAOCULAR LENS  IMPLANT, BILATERAL Bilateral 2011  . COLONOSCOPY    . COLONOSCOPY N/A 05/03/2015   Procedure: COLONOSCOPY;  Surgeon: Gatha Mayer, MD;  Location: [place];  Service: Endoscopy;  Laterality: N/A;  . CORONARY ARTERY BYPASS GRAFT  2005   CABG X4  . ENDARTERECTOMY Left 10/17/2014   Procedure: ENDARTERECTOMY CAROTID;  Surgeon: Mal Misty, MD;  Location: [place];  Service: Vascular;  Laterality: Left;  . I&D EXTREMITY   01/31/2012   Procedure: IRRIGATION AND DEBRIDEMENT EXTREMITY;  Surgeon: Elam Dutch, MD;  Location: [place];  Service: Vascular;  Laterality: Left;  I & D Left BKA   . INSERTION OF DIALYSIS CATHETER Right 04/19/2014   Procedure: INSERTION OF DIALYSIS CATHETER-INTERNAL JUGULAR;  Surgeon: Mal Misty, MD;  Location: [place];  Service: Vascular;  Laterality: Right;  . INSERTION OF DIALYSIS CATHETER N/A 11/06/2014   Procedure: INSERTION OF 23cm DIALYSIS CATHETER - right internal jugular;  Surgeon: Mal Misty, MD;  Location: [place];  Service: Vascular;  Laterality: N/A;  .  KNEE CARTILAGE SURGERY Left 1964  . LEFT HEART CATH AND CORONARY ANGIOGRAPHY N/A 04/12/2016   Procedure: Left Heart Cath and Coronary Angiography;  Surgeon: Nelva Bush, MD;  Location: [place] CV LAB;  Service: Cardiovascular;  Laterality: N/A;  . LIGATION OF ARTERIOVENOUS  FISTULA Right 11/06/2014   Procedure: LIGATION OF RADIOCEPHALIC ARTERIOVENOUS FISTULA;  Surgeon: Mal Misty, MD;  Location: [place];  Service: Vascular;  Laterality: Right;  . LIGATION OF COMPETING BRANCHES OF ARTERIOVENOUS FISTULA Right 06/19/2014   Procedure: Right Arm LIGATION OF COMPETING BRANCHES OF RADIOCEPHALIC ARTERIOVENOUS FISTULA;  Surgeon: Mal Misty, MD;  Location: [place];  Service: Vascular;  Laterality: Right;  . PROSTATECTOMY  2009       History of present illness and  Hospital Course:     Kindly see H&P for history of present illness and admission details, please review complete Labs, Consult reports and Test reports for all details in brief  HPI  from the history and physical done on the day of admission  08/08/2017  TORETTO TINGLER is a 77 y.o. male with medical history significant of ESRD on HD  MWF, uncontrolled DM 2, hyperlipidemia, peripheral vascular disease, CAD History of DVT 2013, status post bilateral BKA HTN, ischemic cardiomyopathy.  Presented with  Abdominal pain, fatigue malaise for the past few days. He  reports tightness in his abdomen cold like symptoms with nonproductive cough and runny nose for at least 2 weeks no associated diarrhea his urine has been darker than usual he has had decreased by mouth intake but no fevers or chills no chest pain no wheezing he is not on oxygen at home. Family states he's been having worsening cough for at least 2 weeks now. Reports regular BM's occasional constipation.   He was feeling so bad that he did not go to dialysis today his dialysis was Friday. he has access in the left upper arm and has been followed by vascular surgery. He has history of Chronic abdominal pain. patient was recently admitted on June 19 due to abdominal pain at that time ultrasound was equivocal hiatus scan was done which was no active for acute cholecystitis during that admission patient has been seen in consult by general surgery no indication for operative intervention abdominal pain has resolved. Regarding pertinent Chronic problems: regarding history of coronary artery disease status post CABG in 2007  last echogram done in April 2018 showing EF 35-40 percent grade 1 diastolic dysfunction Shand on Coumadin for history of atrial fibrillation While in ER: Noted hypoxic down to high 80's needed 2 L  Found LFT elevations US showed gall stones no cholecystitis, CXR showed PNA      Hospital Course    acute hypoxic respiratory failure -  Patient initially hypoxic in the 80s requiring 2 L nasal cannula, initially thought to be having Hcap given retrocardiac opacity, but he was afebrile, no leukocytosis, x-ray findings most likely related to pulmonary edema and volume overload, antibiotic has been stopped, and his dry weight has been adjusted, significant improvement of his hypoxia, currently saturating 96-98% on room air with no requirement of oxygen. -  Hcap ruled out   pulmonary edema -  Please see above discussion, improved with adjustment of dry weight. On dialysis   upper  abdominal pain -  Abdominal ultrasound unchanged, recent HIDA scan last month with no acute abnormalities, he denies any cough complaints this morning.   anemia of chronic renal disease -  Management per renal   Atrial  fibrillation - CHA2DS2 vas score 5: continue coumadin  - Rate control: Currently controlled with Coreg will continue  CAD (coronary artery disease) -stable continue home  aspirin  DM type 2, uncontrolled, with renal complications -    continue with home regimen  HTN (hypertension) -  Soft blood pressure after volume adjustment with dialysis, has stopped hydralazine and Norvasc per renal recommendation, continue with Coreg and Imdur  Elevated LFTs - continue to monitor known history of cholelithiasis, repeat ultrasound with no acute findings, recent HIDA scan which is negative, this is most likely related to hepatic congestion, now significantly trending down after diuresis, repeat LFTs in 1-2 weeks as an outpatient  Cholelithiasis - no evidence of cholecystitis per surgery in the past - not a candidate for surgical intervention  Hx DVT - on coumadin    Discharge Condition: stable   Follow UP  Follow-up Information    Bartholome Bill, MD. Go on 08/16/2017.   Specialty:  Family Medicine Why:  @4pm  Contact information: [place] [id number] [phone]             Discharge Instructions  and  Discharge Medications     Discharge Instructions    Discharge instructions   Complete by:  As directed    Follow with Primary MD Bartholome Bill, MD in 7 days   Get CBC, CMP,  checked  by Primary MD next visit.    Activity:  Full fall precautions & assistance as needed   Disposition Home    Diet:  Renal/carb modified with 1200 cc fluid restriction , with feeding assistance and aspiration precautions.  On your next visit with your primary care physician please Get Medicines reviewed and  adjusted.   Please request your Prim.MD to go over all Hospital Tests and Procedure/Radiological results at the follow up, please get all Hospital records sent to your Prim MD by signing hospital release before you go home.   If you experience worsening of your admission symptoms, develop shortness of breath, life threatening emergency, suicidal or homicidal thoughts you must seek medical attention immediately by calling 911 or calling your MD immediately  if symptoms less severe.  You Must read complete instructions/literature along with all the possible adverse reactions/side effects for all the Medicines you take and that have been prescribed to you. Take any new Medicines after you have completely understood and accpet all the possible adverse reactions/side effects.   Do not drive, operating heavy machinery, perform activities at heights, swimming or participation in water activities or provide baby sitting services if your were admitted for syncope or siezures until you have seen by Primary MD or a Neurologist and advised to do so again.  Do not drive when taking Pain medications.    Do not take more than prescribed Pain, Sleep and Anxiety Medications  Special Instructions: If you have smoked or chewed Tobacco  in the last 2 yrs please stop smoking, stop any regular Alcohol  and or any Recreational drug use.  Wear Seat belts while driving.   Please note  You were cared for by a hospitalist during your hospital stay. If you have any questions about your discharge medications or the care you received while you were in the hospital after you are discharged, you can call the unit and asked to speak with the hospitalist on call if the hospitalist that took care of you is not available. Once you are discharged, your primary care physician  will handle any further medical issues. Please note that NO REFILLS for any discharge medications will be authorized once you are discharged, as it is  imperative that you return to your primary care physician (or establish a relationship with a primary care physician if you do not have one) for your aftercare needs so that they can reassess your need for medications and monitor your lab values.     Allergies as of 08/10/2017      Reactions   Oxycodone Other (See Comments)   Hallucinations      Medication List    STOP taking these medications   amLODipine 5 MG tablet Commonly known as:  NORVASC   hydrALAZINE 25 MG tablet Commonly known as:  APRESOLINE     TAKE these medications   aspirin EC 81 MG tablet Take 81 mg by mouth See admin instructions. Take one tablet (81 mg) by mouth on Sunday, Tuesday, Thursday, Saturday mornings, take one tablet on Monday, Wednesday, Friday after dialysis   calcium acetate 667 MG capsule Commonly known as:  PHOSLO Take 667 mg by mouth 3 (three) times daily with meals.   carvedilol 6.25 MG tablet Commonly known as:  COREG Take 1 tablet (6.25 mg total) by mouth 2 (two) times daily with a meal.   ferric citrate 1 GM 210 MG(Fe) tablet Commonly known as:  AURYXIA Take 420 mg by mouth 3 (three) times daily with meals.   HYDROcodone-acetaminophen 5-325 MG tablet Commonly known as:  NORCO/VICODIN Take 1 tablet by mouth every 8 (eight) hours as needed (pain).   isosorbide mononitrate 30 MG 24 hr tablet Commonly known as:  IMDUR Take 1 tablet (30 mg total) by mouth daily. What changed:    when to take this  additional instructions   LEVEMIR FLEXTOUCH 100 UNIT/ML Pen Generic drug:  Insulin Detemir Inject 13 Units into the skin daily after breakfast.   NOVOLOG FLEXPEN 100 UNIT/ML FlexPen Generic drug:  insulin aspart Inject 2-3 Units into the skin 3 (three) times daily with meals as needed for high blood sugar (CBG >150).   polyethylene glycol packet Commonly known as:  MIRALAX Take 17 g by mouth daily. What changed:    when to take this  reasons to take this   pravastatin 40 MG  tablet Commonly known as:  PRAVACHOL Take 40 mg by mouth See admin instructions. Take one tablet (40 mg) by mouth on Sunday, Tuesday, Thursday, Saturday mornings, take one tablet on Monday, Wednesday, Friday after dialysis   pregabalin 75 MG capsule Commonly known as:  LYRICA Take 75 mg by mouth See admin instructions. Take one tablet (75 mg) by mouth on Sunday, Tuesday, Thursday, Saturday mornings, take one tablet on Monday, Wednesday, Friday after dialysis   Promethazine-DM 6.25-15 MG/5ML Soln Take 5 mLs by mouth 3 (three) times daily as needed (cough).   QUEtiapine 25 MG tablet Commonly known as:  SEROQUEL Take 1 tablet (25 mg total) by mouth daily. What changed:    when to take this  reasons to take this   warfarin 7.5 MG tablet Commonly known as:  COUMADIN Take 1 tablet (7.5 mg total) by mouth daily at 6 PM.         Diet and Activity recommendation: See Discharge Instructions above   Consults obtained -  Renal   Major procedures and Radiology Reports - PLEASE review detailed and final reports for all details, in brief -    Dg Chest 2 View  Result Date: 08/08/2017 CLINICAL DATA:  Two week history of cough. EXAM: CHEST - 2 VIEW COMPARISON:  Chest x-rays dated 06/30/2017 in 06/28/2016. FINDINGS: Stable cardiomegaly. Median sternotomy wires are stable in alignment. Atherosclerotic changes again noted at the aortic arch. Coarse interstitial markings again noted bilaterally suggesting some degree of chronic interstitial lung disease, although increased compared to previous studies suggesting superimposed edema. New dense opacity at the posterior costophrenic angle, uncertain side as it is only seen on the lateral view, pneumonia versus atelectasis. No pleural effusion or pneumothorax seen. IMPRESSION: 1. New dense opacity at the posterior costophrenic angle, pneumonia versus atelectasis, suspicious for pneumonia given the history of productive cough. 2. Probable bilateral  interstitial edema, indicating mild CHF/volume overload, superimposed on chronic interstitial lung disease. 3. Stable cardiomegaly. 4. Aortic atherosclerosis. Electronically Signed   By: Franki Cabot M.D.   On: 08/08/2017 15:12   Dg Abd 1 View  Result Date: 08/08/2017 CLINICAL DATA:  Abdominal distension with nausea vomiting constipation EXAM: ABDOMEN - 1 VIEW COMPARISON:  06/28/2016 FINDINGS: Nonobstructed gas pattern. Surgical clips in the pelvis. Vascular calcifications. Possible AVN changes of both femoral heads with no collapse. Multiple small calcifications in the right upper quadrant, could correspond to the history of gallstones IMPRESSION: 1. Nonobstructed gas pattern 2. Multiple small right upper quadrant calcifications may correspond to the history of gallstones 3. Possible AVN type changes of both femoral heads with no collapse on limited view of the pelvis Electronically Signed   By: Donavan Foil M.D.   On: 08/08/2017 21:20   US Abdomen Limited Ruq  Result Date: 08/08/2017 CLINICAL DATA:  Elevated LFTs. EXAM: ULTRASOUND ABDOMEN LIMITED RIGHT UPPER QUADRANT COMPARISON:  06/29/2017. FINDINGS: Gallbladder: Small gallstones are present, with the largest measuring 6 mm in single dimension. Gallbladder wall thickening, upper limits of normal, mildly increased at 3.3 mm. No pericholecystic fluid. Negative sonographic Murphy's sign. Common bile duct: Diameter: 3 mm, normal. Liver: No focal lesion identified. Within normal limits in parenchymal echogenicity. Portal vein is patent on color Doppler imaging with normal direction of blood flow towards the liver. IMPRESSION: Cholelithiasis, without gallbladder wall tenderness, but with stable mild gallbladder wall thickening of 3 mm. No biliary ductal dilatation to suggest an obstructive cause of LFTs. If further investigation desired, consider nuclear medicine hepatobiliary imaging. Electronically Signed   By: Staci Righter M.D.   On: 08/08/2017 16:28     Micro Results    Recent Results (from the past 240 hour(s))  Respiratory Panel by PCR     Status: None   Collection Time: 08/08/17 10:03 PM  Result Value Ref Range Status   Adenovirus NOT DETECTED NOT DETECTED Final   Coronavirus 229E NOT DETECTED NOT DETECTED Final   Coronavirus HKU1 NOT DETECTED NOT DETECTED Final   Coronavirus NL63 NOT DETECTED NOT DETECTED Final   Coronavirus OC43 NOT DETECTED NOT DETECTED Final   Metapneumovirus NOT DETECTED NOT DETECTED Final   Rhinovirus / Enterovirus NOT DETECTED NOT DETECTED Final   Influenza A NOT DETECTED NOT DETECTED Final   Influenza B NOT DETECTED NOT DETECTED Final   Parainfluenza Virus 1 NOT DETECTED NOT DETECTED Final   Parainfluenza Virus 2 NOT DETECTED NOT DETECTED Final   Parainfluenza Virus 3 NOT DETECTED NOT DETECTED Final   Parainfluenza Virus 4 NOT DETECTED NOT DETECTED Final   Respiratory Syncytial Virus NOT DETECTED NOT DETECTED Final   Bordetella pertussis NOT DETECTED NOT DETECTED Final   Chlamydophila pneumoniae NOT DETECTED NOT DETECTED Final   Mycoplasma pneumoniae NOT DETECTED NOT DETECTED Final  Comment: Performed at [facility], [place] [address], [place], [place] [address]  MRSA PCR Screening     Status: None   Collection Time: 08/08/17 10:03 PM  Result Value Ref Range Status   MRSA by PCR NEGATIVE NEGATIVE Final    Comment:        The GeneXpert MRSA Assay (FDA approved for NASAL specimens only), is one component of a comprehensive MRSA colonization surveillance program. It is not intended to diagnose MRSA infection nor to guide or monitor treatment for MRSA infections. Performed at [facility], [place] [address] [place], [place] [address]   Culture, blood (routine x 2) Call MD if unable to obtain prior to antibiotics being given     Status: None (Preliminary result)   Collection Time: 08/08/17 10:23 PM  Result Value Ref Range Status   Specimen Description BLOOD RIGHT ANTECUBITAL  Final    Special Requests   Final    BOTTLES DRAWN AEROBIC ONLY Blood Culture results may not be optimal due to an excessive volume of blood received in culture bottles   Culture   Final    NO GROWTH 1 DAY Performed at [facility] [address] [place], [place] [address]    Report Status PENDING  Incomplete  Culture, blood (routine x 2) Call MD if unable to obtain prior to antibiotics being given     Status: None (Preliminary result)   Collection Time: 08/08/17 10:23 PM  Result Value Ref Range Status   Specimen Description BLOOD RIGHT ANTECUBITAL  Final   Special Requests   Final    BOTTLES DRAWN AEROBIC ONLY Blood Culture adequate volume   Culture   Final    NO GROWTH 1 DAY Performed at [facility], [address], [place], [place] [address]    Report Status PENDING  Incomplete       Today   Subjective:   Jabaree Mercado today has no headache,no chest abdominal pain,no new weakness tingling or numbness, feels much better wants to go home today.   Objective:   Blood pressure (!) 113/56, pulse 63, temperature 97.8 F (36.6 C), temperature source Oral, resp. rate 18, height 5\' 8"  (1.727 m), weight 68.5 kg (151 lb 0.2 oz), SpO2 96 %.   Intake/Output Summary (Last 24 hours) at 08/10/2017 1412 Last data filed at 08/10/2017 1141 Gross per 24 hour  Intake 240 ml  Output 2814 ml  Net -2574 ml    Exam Awake Alert, Oriented x 3, No new F.N deficits, Normal affect Symmetrical Chest wall movement, Good air movement bilaterally, CTAB RRR,No Gallops,Rubs or new Murmurs, No Parasternal Heave +ve B.Sounds, Abd Soft, Non tender,  No rebound -guarding or rigidity.  bilateral AKA,no evidence of edema in bilateral stump,  Data Review   CBC w Diff:  Lab Results  Component Value Date   WBC 6.0 08/10/2017   HGB 11.3 (L) 08/10/2017   HGB 12.1 (L) 04/10/2015   HCT 36.9 (L) 08/10/2017   HCT 38.0 04/10/2015   PLT 124 (L) 08/10/2017   PLT 246 04/10/2015   LYMPHOPCT 19  06/30/2017   MONOPCT 11 06/30/2017   EOSPCT 1 06/30/2017   BASOPCT 1 06/30/2017    CMP:  Lab Results  Component Value Date   NA 140 08/10/2017   NA 134 04/10/2015   K 4.0 08/10/2017   CL 99 08/10/2017   CO2 31 08/10/2017   BUN 29 (H) 08/10/2017   BUN 26 04/10/2015  CREATININE 6.04 (H) 08/10/2017   PROT 5.7 (L) 08/10/2017   PROT 7.0 04/10/2015   ALBUMIN 2.9 (L) 08/10/2017   ALBUMIN 4.2 04/10/2015   BILITOT 0.8 08/10/2017   BILITOT 0.3 04/10/2015   ALKPHOS 131 (H) 08/10/2017   AST 158 (H) 08/10/2017   ALT 278 (H) 08/10/2017  .   Total Time in preparing paper work, data evaluation and todays exam - 74 minutes  Phillips Climes M.D on 08/10/2017 at 2:12 PM  [facility]   Office  [phone]

## 2017-08-10 NOTE — Discharge Instructions (Signed)
Follow with Primary MD Bartholome Bill, MD in 7 days   Get CBC, CMP,  checked  by Primary MD next visit.    Activity:  Full fall precautions & assistance as needed   Disposition Home    Diet:  Renal/carb modified with 1200 cc fluid restriction , with feeding assistance and aspiration precautions.  On your next visit with your primary care physician please Get Medicines reviewed and adjusted.   Please request your Prim.MD to go over all Hospital Tests and Procedure/Radiological results at the follow up, please get all Hospital records sent to your Prim MD by signing hospital release before you go home.   If you experience worsening of your admission symptoms, develop shortness of breath, life threatening emergency, suicidal or homicidal thoughts you must seek medical attention immediately by calling 911 or calling your MD immediately  if symptoms less severe.  You Must read complete instructions/literature along with all the possible adverse reactions/side effects for all the Medicines you take and that have been prescribed to you. Take any new Medicines after you have completely understood and accpet all the possible adverse reactions/side effects.   Do not drive, operating heavy machinery, perform activities at heights, swimming or participation in water activities or provide baby sitting services if your were admitted for syncope or siezures until you have seen by Primary MD or a Neurologist and advised to do so again.  Do not drive when taking Pain medications.    Do not take more than prescribed Pain, Sleep and Anxiety Medications  Special Instructions: If you have smoked or chewed Tobacco  in the last 2 yrs please stop smoking, stop any regular Alcohol  and or any Recreational drug use.  Wear Seat belts while driving.   Please note  You were cared for by a hospitalist during your hospital stay. If you have any questions about your discharge medications or the care you  received while you were in the hospital after you are discharged, you can call the unit and asked to speak with the hospitalist on call if the hospitalist that took care of you is not available. Once you are discharged, your primary care physician will handle any further medical issues. Please note that NO REFILLS for any discharge medications will be authorized once you are discharged, as it is imperative that you return to your primary care physician (or establish a relationship with a primary care physician if you do not have one) for your aftercare needs so that they can reassess your need for medications and monitor your lab values.

## 2017-08-11 ENCOUNTER — Encounter (HOSPITAL_COMMUNITY): Payer: Medicare Other

## 2017-08-11 ENCOUNTER — Ambulatory Visit: Payer: Medicare Other | Admitting: Vascular Surgery

## 2017-08-11 DIAGNOSIS — Z992 Dependence on renal dialysis: Secondary | ICD-10-CM | POA: Diagnosis not present

## 2017-08-11 DIAGNOSIS — N186 End stage renal disease: Secondary | ICD-10-CM | POA: Diagnosis not present

## 2017-08-11 DIAGNOSIS — E1122 Type 2 diabetes mellitus with diabetic chronic kidney disease: Secondary | ICD-10-CM | POA: Diagnosis not present

## 2017-08-11 LAB — BASIC METABOLIC PANEL
ANION GAP: 11 (ref 5–15)
BUN: 19 mg/dL (ref 8–23)
CHLORIDE: 98 mmol/L (ref 98–111)
CO2: 30 mmol/L (ref 22–32)
CREATININE: 3.94 mg/dL — AB (ref 0.61–1.24)
Calcium: 9 mg/dL (ref 8.9–10.3)
GFR calc non Af Amer: 13 mL/min — ABNORMAL LOW (ref >60)
GFR, EST AFRICAN AMERICAN: 16 mL/min — AB (ref >60)
Glucose, Bld: 132 mg/dL — ABNORMAL HIGH (ref 70–99)
POTASSIUM: 4 mmol/L (ref 3.5–5.1)
Sodium: 139 mmol/L (ref 135–145)

## 2017-08-11 LAB — GLUCOSE, CAPILLARY: Glucose-Capillary: 124 mg/dL — ABNORMAL HIGH (ref 70–99)

## 2017-08-11 LAB — PROTIME-INR
INR: 1.93
PROTHROMBIN TIME: 21.9 s — AB (ref 11.4–15.2)

## 2017-08-11 MED ORDER — WARFARIN SODIUM 7.5 MG PO TABS: 7.5000 mg | ORAL_TABLET | Freq: Once | ORAL | Status: DC

## 2017-08-11 MED ORDER — GUAIFENESIN ER 600 MG PO TB12
1200.0000 mg | ORAL_TABLET | Freq: Two times a day (BID) | ORAL | Status: DC
  Administered 2017-08-11: 1200 mg via ORAL
  Filled 2017-08-11: qty 2

## 2017-08-11 NOTE — Progress Notes (Signed)
[facility] non emergent ambulance called for transportation home; Aneta Mins [phone]
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
## 2017-08-11 NOTE — Progress Notes (Signed)
Pharmacist Heart Failure Core Measure Documentation  Assessment: Aaron Long has an EF documented as 35-40% on 04/11/16 by echo.  Rationale: Heart failure patients with left ventricular systolic dysfunction (LVSD) and an EF < 40% should be prescribed an angiotensin converting enzyme inhibitor (ACEI) or angiotensin receptor blocker (ARB) at discharge unless a contraindication is documented in the medical record.  This patient is not currently on an ACEI or ARB for HF.  This note is being placed in the record in order to provide documentation that a contraindication to the use of these agents is present for this encounter.  ACE Inhibitor or Angiotensin Receptor Blocker is contraindicated (specify all that apply)  []   ACEI allergy AND ARB allergy []   Angioedema []   Moderate or severe aortic stenosis []   Hyperkalemia [x]   Hypotension []   Renal artery stenosis []   Worsening renal function, preexisting renal disease or dysfunction  Hildred Laser, PharmD Clinical Pharmacist Please check Amion for pharmacy contact number

## 2017-08-11 NOTE — Progress Notes (Signed)
[facility] for warfarin Indication: atrial fibrillation  Allergies  Allergen Reactions  . Oxycodone Other (See Comments)    Hallucinations    Patient Measurements: Height: 5\' 8"  (172.7 cm) Weight: 153 lb 14.1 oz (69.8 kg) IBW/kg (Calculated) : 68.4  Vital Signs: Temp: 98.3 F (36.8 C) (08/01 0513) Temp Source: Oral (08/01 0513) BP: 134/72 (08/01 0513) Pulse Rate: 73 (08/01 0513)  Labs: Recent Labs    08/08/17 1143  08/08/17 2126 08/09/17 0532 08/10/17 0540 08/10/17 0729 08/11/17 0336  HGB 12.5*  --   --  11.8* 11.3*  --   --   HCT 41.7  --   --  38.2* 36.9*  --   --   PLT 113*  --   --  113* 124*  --   --   LABPROT  --    < >  --  21.5* 21.8*  --  21.9*  INR  --    < >  --  1.88 1.91  --  1.93  CREATININE 7.78*  --   --  8.71* 5.94* 6.04* 3.94*  TROPONINI  --   --  0.12*  --   --   --   --    < > = values in this interval not displayed.    Estimated Creatinine Clearance: 15.2 mL/min (A) (by C-G formula based on SCr of 3.94 mg/dL (H)).   Medical History: Past Medical History:  Diagnosis Date  . Abnormality of gait   . Anemia   . BPH (benign prostatic hypertrophy)   . Chronic combined systolic and diastolic CHF (congestive heart failure) ([place])   . Complication of anesthesia    "he gets delirious"  . Constipation    takes Miralax daily  . Coronary artery disease    a. s/p NSTEMI/CABGx4 in 2007 - LIMA-LAD, SVG-optional diagonal, SVVG-OM, SVG-dRCA. b. Nuc 05/2011: nonischemic. c. all bypass grafts widely patent, 80% stenosis involving the grafted OM proximal to SVG anastomosis, which backfills the distal LCx, moderately elevated LVEDP, EF 25%.   . Diabetic peripheral neuropathy ([place])   . Dieulafoy lesion of rectum 05/03/2015  . Disorder of bone and cartilage, unspecified   . DVT (deep venous thrombosis) ([place])    a. Lower extremity DVT in 2013.  [name] ESRD on dialysis [facility])    M-W-F  . Gangrene of toe (HCC)    dry  .  Hemorrhoids   . Hx of colonic polyps   . Hyperlipidemia   . Hypertension   . Hypertrophy of prostate without urinary obstruction and other lower urinary tract symptoms (LUTS)   . Ischemic cardiomyopathy    a. EF 40% in 2007. b. 51% by nuc 05/2011. c. EF 25% by cath and 35-40% by echo in 2018.  [name] Kidney stones   . Lower limb amputation, below knee   . Myocardial infarction ([place]) 2005  . Neuromuscular disorder ([place])    diabetic neuropathy  . NSTEMI (non-ST elevated myocardial infarction) ([place]) 04/2016  . PAD (peripheral artery disease) ([place])    a. R CEA 2007. b. Hx L BKA in 2013. c. s/p LE angioplasty in 2014. d. Hx R BKA in 2014.  [name] PAF (paroxysmal atrial fibrillation) ([place])    a. dx 06/2016.  [name] Peripheral neuropathy   . Prostate cancer ([place]) 2010  . Secondary hyperparathyroidism ([place])    Secondary Hyperpara- Thyroidism, Renal  . Type II diabetes mellitus ([place])      Assessment: 7 yoM on warfarin PTA  for AFib and remote hx DVT admitted with abdominal pain.  -INR up to 1.93  *PTA Dose = 7.5mg  PO daily  Goal of Therapy:  INR 2-3 Monitor platelets by anticoagulation protocol: Yes   Plan:  -Warfarin 7.5mg  PO x1 tonight -Daily INR  Alanda Slim, PharmD Clinical Pharmacist Please check AMION for all [facility] numbers 08/11/2017

## 2017-08-12 ENCOUNTER — Ambulatory Visit (HOSPITAL_COMMUNITY): Admission: RE | Admit: 2017-08-12 | Payer: Medicare Other | Source: Ambulatory Visit | Admitting: Vascular Surgery

## 2017-08-12 ENCOUNTER — Encounter (HOSPITAL_COMMUNITY): Admission: RE | Payer: Self-pay | Source: Ambulatory Visit

## 2017-08-12 DIAGNOSIS — E119 Type 2 diabetes mellitus without complications: Secondary | ICD-10-CM | POA: Diagnosis not present

## 2017-08-12 DIAGNOSIS — N186 End stage renal disease: Secondary | ICD-10-CM | POA: Diagnosis not present

## 2017-08-12 DIAGNOSIS — R109 Unspecified abdominal pain: Secondary | ICD-10-CM | POA: Diagnosis not present

## 2017-08-12 DIAGNOSIS — Z7901 Long term (current) use of anticoagulants: Secondary | ICD-10-CM | POA: Diagnosis not present

## 2017-08-12 DIAGNOSIS — N2581 Secondary hyperparathyroidism of renal origin: Secondary | ICD-10-CM | POA: Diagnosis not present

## 2017-08-12 DIAGNOSIS — I4891 Unspecified atrial fibrillation: Secondary | ICD-10-CM | POA: Diagnosis not present

## 2017-08-12 SURGERY — UPPER EXTREMITY ANGIOGRAPHY
Anesthesia: LOCAL | Laterality: Right

## 2017-08-14 LAB — CULTURE, BLOOD (ROUTINE X 2)
CULTURE: NO GROWTH
Culture: NO GROWTH
Special Requests: ADEQUATE

## 2017-08-15 DIAGNOSIS — N2581 Secondary hyperparathyroidism of renal origin: Secondary | ICD-10-CM | POA: Diagnosis not present

## 2017-08-15 DIAGNOSIS — R109 Unspecified abdominal pain: Secondary | ICD-10-CM | POA: Diagnosis not present

## 2017-08-15 DIAGNOSIS — E119 Type 2 diabetes mellitus without complications: Secondary | ICD-10-CM | POA: Diagnosis not present

## 2017-08-15 DIAGNOSIS — N186 End stage renal disease: Secondary | ICD-10-CM | POA: Diagnosis not present

## 2017-08-15 DIAGNOSIS — I4891 Unspecified atrial fibrillation: Secondary | ICD-10-CM | POA: Diagnosis not present

## 2017-08-15 DIAGNOSIS — Z7901 Long term (current) use of anticoagulants: Secondary | ICD-10-CM | POA: Diagnosis not present

## 2017-08-17 DIAGNOSIS — N2581 Secondary hyperparathyroidism of renal origin: Secondary | ICD-10-CM | POA: Diagnosis not present

## 2017-08-17 DIAGNOSIS — R109 Unspecified abdominal pain: Secondary | ICD-10-CM | POA: Diagnosis not present

## 2017-08-17 DIAGNOSIS — E119 Type 2 diabetes mellitus without complications: Secondary | ICD-10-CM | POA: Diagnosis not present

## 2017-08-17 DIAGNOSIS — N186 End stage renal disease: Secondary | ICD-10-CM | POA: Diagnosis not present

## 2017-08-19 DIAGNOSIS — N186 End stage renal disease: Secondary | ICD-10-CM | POA: Diagnosis not present

## 2017-08-19 DIAGNOSIS — N2581 Secondary hyperparathyroidism of renal origin: Secondary | ICD-10-CM | POA: Diagnosis not present

## 2017-08-19 DIAGNOSIS — E119 Type 2 diabetes mellitus without complications: Secondary | ICD-10-CM | POA: Diagnosis not present

## 2017-08-19 DIAGNOSIS — R109 Unspecified abdominal pain: Secondary | ICD-10-CM | POA: Diagnosis not present

## 2017-08-22 DIAGNOSIS — N2581 Secondary hyperparathyroidism of renal origin: Secondary | ICD-10-CM | POA: Diagnosis not present

## 2017-08-22 DIAGNOSIS — N186 End stage renal disease: Secondary | ICD-10-CM | POA: Diagnosis not present

## 2017-08-22 DIAGNOSIS — Z7901 Long term (current) use of anticoagulants: Secondary | ICD-10-CM | POA: Diagnosis not present

## 2017-08-22 DIAGNOSIS — E119 Type 2 diabetes mellitus without complications: Secondary | ICD-10-CM | POA: Diagnosis not present

## 2017-08-22 DIAGNOSIS — I4891 Unspecified atrial fibrillation: Secondary | ICD-10-CM | POA: Diagnosis not present

## 2017-08-22 DIAGNOSIS — R109 Unspecified abdominal pain: Secondary | ICD-10-CM | POA: Diagnosis not present

## 2017-08-24 DIAGNOSIS — N186 End stage renal disease: Secondary | ICD-10-CM | POA: Diagnosis not present

## 2017-08-24 DIAGNOSIS — E119 Type 2 diabetes mellitus without complications: Secondary | ICD-10-CM | POA: Diagnosis not present

## 2017-08-24 DIAGNOSIS — N2581 Secondary hyperparathyroidism of renal origin: Secondary | ICD-10-CM | POA: Diagnosis not present

## 2017-08-24 DIAGNOSIS — R109 Unspecified abdominal pain: Secondary | ICD-10-CM | POA: Diagnosis not present

## 2017-08-26 DIAGNOSIS — E119 Type 2 diabetes mellitus without complications: Secondary | ICD-10-CM | POA: Diagnosis not present

## 2017-08-26 DIAGNOSIS — N186 End stage renal disease: Secondary | ICD-10-CM | POA: Diagnosis not present

## 2017-08-26 DIAGNOSIS — N2581 Secondary hyperparathyroidism of renal origin: Secondary | ICD-10-CM | POA: Diagnosis not present

## 2017-08-26 DIAGNOSIS — R109 Unspecified abdominal pain: Secondary | ICD-10-CM | POA: Diagnosis not present

## 2017-08-29 DIAGNOSIS — R109 Unspecified abdominal pain: Secondary | ICD-10-CM | POA: Diagnosis not present

## 2017-08-29 DIAGNOSIS — N186 End stage renal disease: Secondary | ICD-10-CM | POA: Diagnosis not present

## 2017-08-29 DIAGNOSIS — I4891 Unspecified atrial fibrillation: Secondary | ICD-10-CM | POA: Diagnosis not present

## 2017-08-29 DIAGNOSIS — N2581 Secondary hyperparathyroidism of renal origin: Secondary | ICD-10-CM | POA: Diagnosis not present

## 2017-08-29 DIAGNOSIS — E119 Type 2 diabetes mellitus without complications: Secondary | ICD-10-CM | POA: Diagnosis not present

## 2017-08-29 DIAGNOSIS — Z7901 Long term (current) use of anticoagulants: Secondary | ICD-10-CM | POA: Diagnosis not present

## 2017-08-31 DIAGNOSIS — N2581 Secondary hyperparathyroidism of renal origin: Secondary | ICD-10-CM | POA: Diagnosis not present

## 2017-08-31 DIAGNOSIS — N186 End stage renal disease: Secondary | ICD-10-CM | POA: Diagnosis not present

## 2017-08-31 DIAGNOSIS — R109 Unspecified abdominal pain: Secondary | ICD-10-CM | POA: Diagnosis not present

## 2017-08-31 DIAGNOSIS — E119 Type 2 diabetes mellitus without complications: Secondary | ICD-10-CM | POA: Diagnosis not present

## 2017-09-02 DIAGNOSIS — D689 Coagulation defect, unspecified: Secondary | ICD-10-CM | POA: Diagnosis not present

## 2017-09-02 DIAGNOSIS — N2581 Secondary hyperparathyroidism of renal origin: Secondary | ICD-10-CM | POA: Diagnosis not present

## 2017-09-02 DIAGNOSIS — I4891 Unspecified atrial fibrillation: Secondary | ICD-10-CM | POA: Diagnosis not present

## 2017-09-02 DIAGNOSIS — E119 Type 2 diabetes mellitus without complications: Secondary | ICD-10-CM | POA: Diagnosis not present

## 2017-09-02 DIAGNOSIS — N186 End stage renal disease: Secondary | ICD-10-CM | POA: Diagnosis not present

## 2017-09-02 DIAGNOSIS — R109 Unspecified abdominal pain: Secondary | ICD-10-CM | POA: Diagnosis not present

## 2017-09-05 DIAGNOSIS — R109 Unspecified abdominal pain: Secondary | ICD-10-CM | POA: Diagnosis not present

## 2017-09-05 DIAGNOSIS — N2581 Secondary hyperparathyroidism of renal origin: Secondary | ICD-10-CM | POA: Diagnosis not present

## 2017-09-05 DIAGNOSIS — Z7901 Long term (current) use of anticoagulants: Secondary | ICD-10-CM | POA: Diagnosis not present

## 2017-09-05 DIAGNOSIS — N186 End stage renal disease: Secondary | ICD-10-CM | POA: Diagnosis not present

## 2017-09-05 DIAGNOSIS — E119 Type 2 diabetes mellitus without complications: Secondary | ICD-10-CM | POA: Diagnosis not present

## 2017-09-05 DIAGNOSIS — I4891 Unspecified atrial fibrillation: Secondary | ICD-10-CM | POA: Diagnosis not present

## 2017-09-07 ENCOUNTER — Other Ambulatory Visit: Payer: Self-pay | Admitting: *Deleted

## 2017-09-07 DIAGNOSIS — N2581 Secondary hyperparathyroidism of renal origin: Secondary | ICD-10-CM | POA: Diagnosis not present

## 2017-09-07 DIAGNOSIS — R109 Unspecified abdominal pain: Secondary | ICD-10-CM | POA: Diagnosis not present

## 2017-09-07 DIAGNOSIS — E119 Type 2 diabetes mellitus without complications: Secondary | ICD-10-CM | POA: Diagnosis not present

## 2017-09-07 DIAGNOSIS — N186 End stage renal disease: Secondary | ICD-10-CM | POA: Diagnosis not present

## 2017-09-09 DIAGNOSIS — N2581 Secondary hyperparathyroidism of renal origin: Secondary | ICD-10-CM | POA: Diagnosis not present

## 2017-09-09 DIAGNOSIS — R109 Unspecified abdominal pain: Secondary | ICD-10-CM | POA: Diagnosis not present

## 2017-09-09 DIAGNOSIS — N186 End stage renal disease: Secondary | ICD-10-CM | POA: Diagnosis not present

## 2017-09-09 DIAGNOSIS — E119 Type 2 diabetes mellitus without complications: Secondary | ICD-10-CM | POA: Diagnosis not present

## 2017-09-11 DIAGNOSIS — N186 End stage renal disease: Secondary | ICD-10-CM | POA: Diagnosis not present

## 2017-09-11 DIAGNOSIS — E1122 Type 2 diabetes mellitus with diabetic chronic kidney disease: Secondary | ICD-10-CM | POA: Diagnosis not present

## 2017-09-11 DIAGNOSIS — Z992 Dependence on renal dialysis: Secondary | ICD-10-CM | POA: Diagnosis not present

## 2017-09-12 DIAGNOSIS — E119 Type 2 diabetes mellitus without complications: Secondary | ICD-10-CM | POA: Diagnosis not present

## 2017-09-12 DIAGNOSIS — D631 Anemia in chronic kidney disease: Secondary | ICD-10-CM | POA: Diagnosis not present

## 2017-09-12 DIAGNOSIS — I4891 Unspecified atrial fibrillation: Secondary | ICD-10-CM | POA: Diagnosis not present

## 2017-09-12 DIAGNOSIS — N186 End stage renal disease: Secondary | ICD-10-CM | POA: Diagnosis not present

## 2017-09-12 DIAGNOSIS — Z23 Encounter for immunization: Secondary | ICD-10-CM | POA: Diagnosis not present

## 2017-09-12 DIAGNOSIS — N2581 Secondary hyperparathyroidism of renal origin: Secondary | ICD-10-CM | POA: Diagnosis not present

## 2017-09-12 DIAGNOSIS — Z7901 Long term (current) use of anticoagulants: Secondary | ICD-10-CM | POA: Diagnosis not present

## 2017-09-14 DIAGNOSIS — D631 Anemia in chronic kidney disease: Secondary | ICD-10-CM | POA: Diagnosis not present

## 2017-09-14 DIAGNOSIS — E119 Type 2 diabetes mellitus without complications: Secondary | ICD-10-CM | POA: Diagnosis not present

## 2017-09-14 DIAGNOSIS — N186 End stage renal disease: Secondary | ICD-10-CM | POA: Diagnosis not present

## 2017-09-14 DIAGNOSIS — Z23 Encounter for immunization: Secondary | ICD-10-CM | POA: Diagnosis not present

## 2017-09-14 DIAGNOSIS — N2581 Secondary hyperparathyroidism of renal origin: Secondary | ICD-10-CM | POA: Diagnosis not present

## 2017-09-15 ENCOUNTER — Telehealth: Payer: Self-pay | Admitting: *Deleted

## 2017-09-15 NOTE — Telephone Encounter (Signed)
Call from patient's wife "He does not want to have this surgery" cancel procedure for 09/16/2017. I called [facility] and informed Sharyn Lull and left a msg. For [place] at [place]. That family called and canceled this procedure again.

## 2017-09-16 ENCOUNTER — Ambulatory Visit (HOSPITAL_COMMUNITY): Admission: RE | Admit: 2017-09-16 | Payer: Medicare Other | Source: Ambulatory Visit | Admitting: Vascular Surgery

## 2017-09-16 ENCOUNTER — Encounter (HOSPITAL_COMMUNITY): Admission: RE | Payer: Self-pay | Source: Ambulatory Visit

## 2017-09-16 DIAGNOSIS — E119 Type 2 diabetes mellitus without complications: Secondary | ICD-10-CM | POA: Diagnosis not present

## 2017-09-16 DIAGNOSIS — D631 Anemia in chronic kidney disease: Secondary | ICD-10-CM | POA: Diagnosis not present

## 2017-09-16 DIAGNOSIS — Z23 Encounter for immunization: Secondary | ICD-10-CM | POA: Diagnosis not present

## 2017-09-16 DIAGNOSIS — N186 End stage renal disease: Secondary | ICD-10-CM | POA: Diagnosis not present

## 2017-09-16 DIAGNOSIS — N2581 Secondary hyperparathyroidism of renal origin: Secondary | ICD-10-CM | POA: Diagnosis not present

## 2017-09-16 SURGERY — UPPER EXTREMITY ANGIOGRAPHY
Anesthesia: LOCAL

## 2017-09-19 DIAGNOSIS — N2581 Secondary hyperparathyroidism of renal origin: Secondary | ICD-10-CM | POA: Diagnosis not present

## 2017-09-19 DIAGNOSIS — I4891 Unspecified atrial fibrillation: Secondary | ICD-10-CM | POA: Diagnosis not present

## 2017-09-19 DIAGNOSIS — Z23 Encounter for immunization: Secondary | ICD-10-CM | POA: Diagnosis not present

## 2017-09-19 DIAGNOSIS — Z7901 Long term (current) use of anticoagulants: Secondary | ICD-10-CM | POA: Diagnosis not present

## 2017-09-19 DIAGNOSIS — E119 Type 2 diabetes mellitus without complications: Secondary | ICD-10-CM | POA: Diagnosis not present

## 2017-09-19 DIAGNOSIS — D631 Anemia in chronic kidney disease: Secondary | ICD-10-CM | POA: Diagnosis not present

## 2017-09-19 DIAGNOSIS — N186 End stage renal disease: Secondary | ICD-10-CM | POA: Diagnosis not present

## 2017-09-21 DIAGNOSIS — E119 Type 2 diabetes mellitus without complications: Secondary | ICD-10-CM | POA: Diagnosis not present

## 2017-09-21 DIAGNOSIS — D631 Anemia in chronic kidney disease: Secondary | ICD-10-CM | POA: Diagnosis not present

## 2017-09-21 DIAGNOSIS — N186 End stage renal disease: Secondary | ICD-10-CM | POA: Diagnosis not present

## 2017-09-21 DIAGNOSIS — Z23 Encounter for immunization: Secondary | ICD-10-CM | POA: Diagnosis not present

## 2017-09-21 DIAGNOSIS — N2581 Secondary hyperparathyroidism of renal origin: Secondary | ICD-10-CM | POA: Diagnosis not present

## 2017-09-23 DIAGNOSIS — Z23 Encounter for immunization: Secondary | ICD-10-CM | POA: Diagnosis not present

## 2017-09-23 DIAGNOSIS — N2581 Secondary hyperparathyroidism of renal origin: Secondary | ICD-10-CM | POA: Diagnosis not present

## 2017-09-23 DIAGNOSIS — D631 Anemia in chronic kidney disease: Secondary | ICD-10-CM | POA: Diagnosis not present

## 2017-09-23 DIAGNOSIS — E119 Type 2 diabetes mellitus without complications: Secondary | ICD-10-CM | POA: Diagnosis not present

## 2017-09-23 DIAGNOSIS — N186 End stage renal disease: Secondary | ICD-10-CM | POA: Diagnosis not present

## 2017-09-26 DIAGNOSIS — N186 End stage renal disease: Secondary | ICD-10-CM | POA: Diagnosis not present

## 2017-09-26 DIAGNOSIS — Z23 Encounter for immunization: Secondary | ICD-10-CM | POA: Diagnosis not present

## 2017-09-26 DIAGNOSIS — N2581 Secondary hyperparathyroidism of renal origin: Secondary | ICD-10-CM | POA: Diagnosis not present

## 2017-09-26 DIAGNOSIS — E119 Type 2 diabetes mellitus without complications: Secondary | ICD-10-CM | POA: Diagnosis not present

## 2017-09-26 DIAGNOSIS — Z7901 Long term (current) use of anticoagulants: Secondary | ICD-10-CM | POA: Diagnosis not present

## 2017-09-26 DIAGNOSIS — D631 Anemia in chronic kidney disease: Secondary | ICD-10-CM | POA: Diagnosis not present

## 2017-09-26 DIAGNOSIS — I4891 Unspecified atrial fibrillation: Secondary | ICD-10-CM | POA: Diagnosis not present

## 2017-09-28 DIAGNOSIS — N186 End stage renal disease: Secondary | ICD-10-CM | POA: Diagnosis not present

## 2017-09-28 DIAGNOSIS — Z23 Encounter for immunization: Secondary | ICD-10-CM | POA: Diagnosis not present

## 2017-09-28 DIAGNOSIS — N2581 Secondary hyperparathyroidism of renal origin: Secondary | ICD-10-CM | POA: Diagnosis not present

## 2017-09-28 DIAGNOSIS — E119 Type 2 diabetes mellitus without complications: Secondary | ICD-10-CM | POA: Diagnosis not present

## 2017-09-28 DIAGNOSIS — D631 Anemia in chronic kidney disease: Secondary | ICD-10-CM | POA: Diagnosis not present

## 2017-09-29 ENCOUNTER — Inpatient Hospital Stay (HOSPITAL_COMMUNITY): Admission: RE | Admit: 2017-09-29 | Payer: Medicare Other | Source: Ambulatory Visit

## 2017-09-29 ENCOUNTER — Ambulatory Visit: Payer: Medicare Other | Admitting: Vascular Surgery

## 2017-09-29 ENCOUNTER — Encounter: Payer: Self-pay | Admitting: Vascular Surgery

## 2017-10-03 DIAGNOSIS — D631 Anemia in chronic kidney disease: Secondary | ICD-10-CM | POA: Diagnosis not present

## 2017-10-03 DIAGNOSIS — Z7901 Long term (current) use of anticoagulants: Secondary | ICD-10-CM | POA: Diagnosis not present

## 2017-10-03 DIAGNOSIS — N186 End stage renal disease: Secondary | ICD-10-CM | POA: Diagnosis not present

## 2017-10-03 DIAGNOSIS — E119 Type 2 diabetes mellitus without complications: Secondary | ICD-10-CM | POA: Diagnosis not present

## 2017-10-03 DIAGNOSIS — Z23 Encounter for immunization: Secondary | ICD-10-CM | POA: Diagnosis not present

## 2017-10-03 DIAGNOSIS — N2581 Secondary hyperparathyroidism of renal origin: Secondary | ICD-10-CM | POA: Diagnosis not present

## 2017-10-03 DIAGNOSIS — I4891 Unspecified atrial fibrillation: Secondary | ICD-10-CM | POA: Diagnosis not present

## 2017-10-05 DIAGNOSIS — D631 Anemia in chronic kidney disease: Secondary | ICD-10-CM | POA: Diagnosis not present

## 2017-10-05 DIAGNOSIS — Z23 Encounter for immunization: Secondary | ICD-10-CM | POA: Diagnosis not present

## 2017-10-05 DIAGNOSIS — N186 End stage renal disease: Secondary | ICD-10-CM | POA: Diagnosis not present

## 2017-10-05 DIAGNOSIS — N2581 Secondary hyperparathyroidism of renal origin: Secondary | ICD-10-CM | POA: Diagnosis not present

## 2017-10-05 DIAGNOSIS — E119 Type 2 diabetes mellitus without complications: Secondary | ICD-10-CM | POA: Diagnosis not present

## 2017-10-07 DIAGNOSIS — N2581 Secondary hyperparathyroidism of renal origin: Secondary | ICD-10-CM | POA: Diagnosis not present

## 2017-10-07 DIAGNOSIS — D631 Anemia in chronic kidney disease: Secondary | ICD-10-CM | POA: Diagnosis not present

## 2017-10-07 DIAGNOSIS — Z23 Encounter for immunization: Secondary | ICD-10-CM | POA: Diagnosis not present

## 2017-10-07 DIAGNOSIS — E119 Type 2 diabetes mellitus without complications: Secondary | ICD-10-CM | POA: Diagnosis not present

## 2017-10-07 DIAGNOSIS — N186 End stage renal disease: Secondary | ICD-10-CM | POA: Diagnosis not present

## 2017-10-10 DIAGNOSIS — D631 Anemia in chronic kidney disease: Secondary | ICD-10-CM | POA: Diagnosis not present

## 2017-10-10 DIAGNOSIS — N186 End stage renal disease: Secondary | ICD-10-CM | POA: Diagnosis not present

## 2017-10-10 DIAGNOSIS — Z7901 Long term (current) use of anticoagulants: Secondary | ICD-10-CM | POA: Diagnosis not present

## 2017-10-10 DIAGNOSIS — E119 Type 2 diabetes mellitus without complications: Secondary | ICD-10-CM | POA: Diagnosis not present

## 2017-10-10 DIAGNOSIS — Z23 Encounter for immunization: Secondary | ICD-10-CM | POA: Diagnosis not present

## 2017-10-10 DIAGNOSIS — N2581 Secondary hyperparathyroidism of renal origin: Secondary | ICD-10-CM | POA: Diagnosis not present

## 2017-10-10 DIAGNOSIS — I4891 Unspecified atrial fibrillation: Secondary | ICD-10-CM | POA: Diagnosis not present

## 2017-10-11 DIAGNOSIS — Z992 Dependence on renal dialysis: Secondary | ICD-10-CM | POA: Diagnosis not present

## 2017-10-11 DIAGNOSIS — E1122 Type 2 diabetes mellitus with diabetic chronic kidney disease: Secondary | ICD-10-CM | POA: Diagnosis not present

## 2017-10-11 DIAGNOSIS — N186 End stage renal disease: Secondary | ICD-10-CM | POA: Diagnosis not present

## 2017-10-14 DIAGNOSIS — N186 End stage renal disease: Secondary | ICD-10-CM | POA: Diagnosis not present

## 2017-10-14 DIAGNOSIS — E119 Type 2 diabetes mellitus without complications: Secondary | ICD-10-CM | POA: Diagnosis not present

## 2017-10-14 DIAGNOSIS — D509 Iron deficiency anemia, unspecified: Secondary | ICD-10-CM | POA: Diagnosis not present

## 2017-10-14 DIAGNOSIS — N2581 Secondary hyperparathyroidism of renal origin: Secondary | ICD-10-CM | POA: Diagnosis not present

## 2017-10-14 DIAGNOSIS — D631 Anemia in chronic kidney disease: Secondary | ICD-10-CM | POA: Diagnosis not present

## 2017-10-17 DIAGNOSIS — Z7901 Long term (current) use of anticoagulants: Secondary | ICD-10-CM | POA: Diagnosis not present

## 2017-10-17 DIAGNOSIS — N2581 Secondary hyperparathyroidism of renal origin: Secondary | ICD-10-CM | POA: Diagnosis not present

## 2017-10-17 DIAGNOSIS — D509 Iron deficiency anemia, unspecified: Secondary | ICD-10-CM | POA: Diagnosis not present

## 2017-10-17 DIAGNOSIS — E119 Type 2 diabetes mellitus without complications: Secondary | ICD-10-CM | POA: Diagnosis not present

## 2017-10-17 DIAGNOSIS — I4891 Unspecified atrial fibrillation: Secondary | ICD-10-CM | POA: Diagnosis not present

## 2017-10-17 DIAGNOSIS — N186 End stage renal disease: Secondary | ICD-10-CM | POA: Diagnosis not present

## 2017-10-17 DIAGNOSIS — D631 Anemia in chronic kidney disease: Secondary | ICD-10-CM | POA: Diagnosis not present

## 2017-10-19 DIAGNOSIS — E119 Type 2 diabetes mellitus without complications: Secondary | ICD-10-CM | POA: Diagnosis not present

## 2017-10-19 DIAGNOSIS — D631 Anemia in chronic kidney disease: Secondary | ICD-10-CM | POA: Diagnosis not present

## 2017-10-19 DIAGNOSIS — N2581 Secondary hyperparathyroidism of renal origin: Secondary | ICD-10-CM | POA: Diagnosis not present

## 2017-10-19 DIAGNOSIS — N186 End stage renal disease: Secondary | ICD-10-CM | POA: Diagnosis not present

## 2017-10-19 DIAGNOSIS — D509 Iron deficiency anemia, unspecified: Secondary | ICD-10-CM | POA: Diagnosis not present

## 2017-10-21 DIAGNOSIS — E119 Type 2 diabetes mellitus without complications: Secondary | ICD-10-CM | POA: Diagnosis not present

## 2017-10-21 DIAGNOSIS — N186 End stage renal disease: Secondary | ICD-10-CM | POA: Diagnosis not present

## 2017-10-21 DIAGNOSIS — D631 Anemia in chronic kidney disease: Secondary | ICD-10-CM | POA: Diagnosis not present

## 2017-10-21 DIAGNOSIS — N2581 Secondary hyperparathyroidism of renal origin: Secondary | ICD-10-CM | POA: Diagnosis not present

## 2017-10-21 DIAGNOSIS — D509 Iron deficiency anemia, unspecified: Secondary | ICD-10-CM | POA: Diagnosis not present

## 2017-10-24 DIAGNOSIS — I4891 Unspecified atrial fibrillation: Secondary | ICD-10-CM | POA: Diagnosis not present

## 2017-10-24 DIAGNOSIS — Z7901 Long term (current) use of anticoagulants: Secondary | ICD-10-CM | POA: Diagnosis not present

## 2017-10-24 DIAGNOSIS — E119 Type 2 diabetes mellitus without complications: Secondary | ICD-10-CM | POA: Diagnosis not present

## 2017-10-24 DIAGNOSIS — D631 Anemia in chronic kidney disease: Secondary | ICD-10-CM | POA: Diagnosis not present

## 2017-10-24 DIAGNOSIS — N2581 Secondary hyperparathyroidism of renal origin: Secondary | ICD-10-CM | POA: Diagnosis not present

## 2017-10-24 DIAGNOSIS — D509 Iron deficiency anemia, unspecified: Secondary | ICD-10-CM | POA: Diagnosis not present

## 2017-10-24 DIAGNOSIS — N186 End stage renal disease: Secondary | ICD-10-CM | POA: Diagnosis not present

## 2017-10-26 DIAGNOSIS — N186 End stage renal disease: Secondary | ICD-10-CM | POA: Diagnosis not present

## 2017-10-26 DIAGNOSIS — D631 Anemia in chronic kidney disease: Secondary | ICD-10-CM | POA: Diagnosis not present

## 2017-10-26 DIAGNOSIS — N2581 Secondary hyperparathyroidism of renal origin: Secondary | ICD-10-CM | POA: Diagnosis not present

## 2017-10-26 DIAGNOSIS — D509 Iron deficiency anemia, unspecified: Secondary | ICD-10-CM | POA: Diagnosis not present

## 2017-10-26 DIAGNOSIS — E119 Type 2 diabetes mellitus without complications: Secondary | ICD-10-CM | POA: Diagnosis not present

## 2017-10-28 DIAGNOSIS — D631 Anemia in chronic kidney disease: Secondary | ICD-10-CM | POA: Diagnosis not present

## 2017-10-28 DIAGNOSIS — N2581 Secondary hyperparathyroidism of renal origin: Secondary | ICD-10-CM | POA: Diagnosis not present

## 2017-10-28 DIAGNOSIS — N186 End stage renal disease: Secondary | ICD-10-CM | POA: Diagnosis not present

## 2017-10-28 DIAGNOSIS — E119 Type 2 diabetes mellitus without complications: Secondary | ICD-10-CM | POA: Diagnosis not present

## 2017-10-28 DIAGNOSIS — D509 Iron deficiency anemia, unspecified: Secondary | ICD-10-CM | POA: Diagnosis not present

## 2017-11-02 DIAGNOSIS — Z7901 Long term (current) use of anticoagulants: Secondary | ICD-10-CM | POA: Diagnosis not present

## 2017-11-02 DIAGNOSIS — I4891 Unspecified atrial fibrillation: Secondary | ICD-10-CM | POA: Diagnosis not present

## 2017-11-02 DIAGNOSIS — E119 Type 2 diabetes mellitus without complications: Secondary | ICD-10-CM | POA: Diagnosis not present

## 2017-11-02 DIAGNOSIS — N2581 Secondary hyperparathyroidism of renal origin: Secondary | ICD-10-CM | POA: Diagnosis not present

## 2017-11-02 DIAGNOSIS — D509 Iron deficiency anemia, unspecified: Secondary | ICD-10-CM | POA: Diagnosis not present

## 2017-11-02 DIAGNOSIS — N186 End stage renal disease: Secondary | ICD-10-CM | POA: Diagnosis not present

## 2017-11-02 DIAGNOSIS — D631 Anemia in chronic kidney disease: Secondary | ICD-10-CM | POA: Diagnosis not present

## 2017-11-04 DIAGNOSIS — D509 Iron deficiency anemia, unspecified: Secondary | ICD-10-CM | POA: Diagnosis not present

## 2017-11-04 DIAGNOSIS — E119 Type 2 diabetes mellitus without complications: Secondary | ICD-10-CM | POA: Diagnosis not present

## 2017-11-04 DIAGNOSIS — D631 Anemia in chronic kidney disease: Secondary | ICD-10-CM | POA: Diagnosis not present

## 2017-11-04 DIAGNOSIS — N186 End stage renal disease: Secondary | ICD-10-CM | POA: Diagnosis not present

## 2017-11-04 DIAGNOSIS — N2581 Secondary hyperparathyroidism of renal origin: Secondary | ICD-10-CM | POA: Diagnosis not present

## 2017-11-07 DIAGNOSIS — I4891 Unspecified atrial fibrillation: Secondary | ICD-10-CM | POA: Diagnosis not present

## 2017-11-07 DIAGNOSIS — Z7901 Long term (current) use of anticoagulants: Secondary | ICD-10-CM | POA: Diagnosis not present

## 2017-11-07 DIAGNOSIS — N2581 Secondary hyperparathyroidism of renal origin: Secondary | ICD-10-CM | POA: Diagnosis not present

## 2017-11-07 DIAGNOSIS — D631 Anemia in chronic kidney disease: Secondary | ICD-10-CM | POA: Diagnosis not present

## 2017-11-07 DIAGNOSIS — D509 Iron deficiency anemia, unspecified: Secondary | ICD-10-CM | POA: Diagnosis not present

## 2017-11-07 DIAGNOSIS — E119 Type 2 diabetes mellitus without complications: Secondary | ICD-10-CM | POA: Diagnosis not present

## 2017-11-07 DIAGNOSIS — N186 End stage renal disease: Secondary | ICD-10-CM | POA: Diagnosis not present

## 2017-11-09 DIAGNOSIS — N2581 Secondary hyperparathyroidism of renal origin: Secondary | ICD-10-CM | POA: Diagnosis not present

## 2017-11-09 DIAGNOSIS — D631 Anemia in chronic kidney disease: Secondary | ICD-10-CM | POA: Diagnosis not present

## 2017-11-09 DIAGNOSIS — N186 End stage renal disease: Secondary | ICD-10-CM | POA: Diagnosis not present

## 2017-11-09 DIAGNOSIS — E119 Type 2 diabetes mellitus without complications: Secondary | ICD-10-CM | POA: Diagnosis not present

## 2017-11-09 DIAGNOSIS — D509 Iron deficiency anemia, unspecified: Secondary | ICD-10-CM | POA: Diagnosis not present

## 2017-11-10 DIAGNOSIS — R490 Dysphonia: Secondary | ICD-10-CM | POA: Diagnosis not present

## 2017-11-10 DIAGNOSIS — Z992 Dependence on renal dialysis: Secondary | ICD-10-CM | POA: Diagnosis not present

## 2017-11-10 DIAGNOSIS — E1122 Type 2 diabetes mellitus with diabetic chronic kidney disease: Secondary | ICD-10-CM | POA: Diagnosis not present

## 2017-11-10 DIAGNOSIS — I10 Essential (primary) hypertension: Secondary | ICD-10-CM | POA: Diagnosis not present

## 2017-11-10 DIAGNOSIS — Z794 Long term (current) use of insulin: Secondary | ICD-10-CM | POA: Diagnosis not present

## 2017-11-10 DIAGNOSIS — Z7901 Long term (current) use of anticoagulants: Secondary | ICD-10-CM | POA: Diagnosis not present

## 2017-11-10 DIAGNOSIS — E1165 Type 2 diabetes mellitus with hyperglycemia: Secondary | ICD-10-CM | POA: Diagnosis not present

## 2017-11-10 DIAGNOSIS — R0982 Postnasal drip: Secondary | ICD-10-CM | POA: Diagnosis not present

## 2017-11-10 DIAGNOSIS — Z89511 Acquired absence of right leg below knee: Secondary | ICD-10-CM | POA: Diagnosis not present

## 2017-11-10 DIAGNOSIS — Z89512 Acquired absence of left leg below knee: Secondary | ICD-10-CM | POA: Diagnosis not present

## 2017-11-10 DIAGNOSIS — N186 End stage renal disease: Secondary | ICD-10-CM | POA: Diagnosis not present

## 2017-11-11 DIAGNOSIS — D631 Anemia in chronic kidney disease: Secondary | ICD-10-CM | POA: Diagnosis not present

## 2017-11-11 DIAGNOSIS — E1122 Type 2 diabetes mellitus with diabetic chronic kidney disease: Secondary | ICD-10-CM | POA: Diagnosis not present

## 2017-11-11 DIAGNOSIS — N2581 Secondary hyperparathyroidism of renal origin: Secondary | ICD-10-CM | POA: Diagnosis not present

## 2017-11-11 DIAGNOSIS — Z992 Dependence on renal dialysis: Secondary | ICD-10-CM | POA: Diagnosis not present

## 2017-11-11 DIAGNOSIS — E119 Type 2 diabetes mellitus without complications: Secondary | ICD-10-CM | POA: Diagnosis not present

## 2017-11-11 DIAGNOSIS — N186 End stage renal disease: Secondary | ICD-10-CM | POA: Diagnosis not present

## 2017-11-14 DIAGNOSIS — N2581 Secondary hyperparathyroidism of renal origin: Secondary | ICD-10-CM | POA: Diagnosis not present

## 2017-11-14 DIAGNOSIS — E119 Type 2 diabetes mellitus without complications: Secondary | ICD-10-CM | POA: Diagnosis not present

## 2017-11-14 DIAGNOSIS — D631 Anemia in chronic kidney disease: Secondary | ICD-10-CM | POA: Diagnosis not present

## 2017-11-14 DIAGNOSIS — Z7901 Long term (current) use of anticoagulants: Secondary | ICD-10-CM | POA: Diagnosis not present

## 2017-11-14 DIAGNOSIS — N186 End stage renal disease: Secondary | ICD-10-CM | POA: Diagnosis not present

## 2017-11-14 DIAGNOSIS — I4891 Unspecified atrial fibrillation: Secondary | ICD-10-CM | POA: Diagnosis not present

## 2017-11-16 DIAGNOSIS — N186 End stage renal disease: Secondary | ICD-10-CM | POA: Diagnosis not present

## 2017-11-16 DIAGNOSIS — D631 Anemia in chronic kidney disease: Secondary | ICD-10-CM | POA: Diagnosis not present

## 2017-11-16 DIAGNOSIS — E119 Type 2 diabetes mellitus without complications: Secondary | ICD-10-CM | POA: Diagnosis not present

## 2017-11-16 DIAGNOSIS — N2581 Secondary hyperparathyroidism of renal origin: Secondary | ICD-10-CM | POA: Diagnosis not present

## 2017-11-18 DIAGNOSIS — N186 End stage renal disease: Secondary | ICD-10-CM | POA: Diagnosis not present

## 2017-11-18 DIAGNOSIS — N2581 Secondary hyperparathyroidism of renal origin: Secondary | ICD-10-CM | POA: Diagnosis not present

## 2017-11-18 DIAGNOSIS — D631 Anemia in chronic kidney disease: Secondary | ICD-10-CM | POA: Diagnosis not present

## 2017-11-18 DIAGNOSIS — E119 Type 2 diabetes mellitus without complications: Secondary | ICD-10-CM | POA: Diagnosis not present

## 2017-11-23 DIAGNOSIS — E119 Type 2 diabetes mellitus without complications: Secondary | ICD-10-CM | POA: Diagnosis not present

## 2017-11-23 DIAGNOSIS — I4891 Unspecified atrial fibrillation: Secondary | ICD-10-CM | POA: Diagnosis not present

## 2017-11-23 DIAGNOSIS — D631 Anemia in chronic kidney disease: Secondary | ICD-10-CM | POA: Diagnosis not present

## 2017-11-23 DIAGNOSIS — N2581 Secondary hyperparathyroidism of renal origin: Secondary | ICD-10-CM | POA: Diagnosis not present

## 2017-11-23 DIAGNOSIS — N186 End stage renal disease: Secondary | ICD-10-CM | POA: Diagnosis not present

## 2017-11-23 DIAGNOSIS — Z7901 Long term (current) use of anticoagulants: Secondary | ICD-10-CM | POA: Diagnosis not present

## 2017-11-25 DIAGNOSIS — N186 End stage renal disease: Secondary | ICD-10-CM | POA: Diagnosis not present

## 2017-11-25 DIAGNOSIS — D631 Anemia in chronic kidney disease: Secondary | ICD-10-CM | POA: Diagnosis not present

## 2017-11-25 DIAGNOSIS — N2581 Secondary hyperparathyroidism of renal origin: Secondary | ICD-10-CM | POA: Diagnosis not present

## 2017-11-25 DIAGNOSIS — E119 Type 2 diabetes mellitus without complications: Secondary | ICD-10-CM | POA: Diagnosis not present

## 2017-11-28 ENCOUNTER — Emergency Department (HOSPITAL_COMMUNITY)
Admission: EM | Admit: 2017-11-28 | Discharge: 2017-11-28 | Disposition: A | Discharge status: Home / Self Care | Payer: Medicare Other | Attending: Emergency Medicine | Admitting: Emergency Medicine

## 2017-11-28 ENCOUNTER — Other Ambulatory Visit: Payer: Self-pay

## 2017-11-28 DIAGNOSIS — Z87891 Personal history of nicotine dependence: Secondary | ICD-10-CM | POA: Insufficient documentation

## 2017-11-28 DIAGNOSIS — I2510 Atherosclerotic heart disease of native coronary artery without angina pectoris: Secondary | ICD-10-CM | POA: Insufficient documentation

## 2017-11-28 DIAGNOSIS — Z794 Long term (current) use of insulin: Secondary | ICD-10-CM | POA: Insufficient documentation

## 2017-11-28 DIAGNOSIS — Z992 Dependence on renal dialysis: Secondary | ICD-10-CM | POA: Diagnosis not present

## 2017-11-28 DIAGNOSIS — R58 Hemorrhage, not elsewhere classified: Secondary | ICD-10-CM | POA: Diagnosis not present

## 2017-11-28 DIAGNOSIS — N2581 Secondary hyperparathyroidism of renal origin: Secondary | ICD-10-CM | POA: Diagnosis not present

## 2017-11-28 DIAGNOSIS — E119 Type 2 diabetes mellitus without complications: Secondary | ICD-10-CM | POA: Diagnosis not present

## 2017-11-28 DIAGNOSIS — Z7901 Long term (current) use of anticoagulants: Secondary | ICD-10-CM | POA: Insufficient documentation

## 2017-11-28 DIAGNOSIS — D631 Anemia in chronic kidney disease: Secondary | ICD-10-CM | POA: Diagnosis not present

## 2017-11-28 DIAGNOSIS — I4891 Unspecified atrial fibrillation: Secondary | ICD-10-CM | POA: Diagnosis not present

## 2017-11-28 DIAGNOSIS — I132 Hypertensive heart and chronic kidney disease with heart failure and with stage 5 chronic kidney disease, or end stage renal disease: Secondary | ICD-10-CM | POA: Diagnosis not present

## 2017-11-28 DIAGNOSIS — N186 End stage renal disease: Secondary | ICD-10-CM | POA: Diagnosis not present

## 2017-11-28 DIAGNOSIS — I5042 Chronic combined systolic (congestive) and diastolic (congestive) heart failure: Secondary | ICD-10-CM | POA: Diagnosis not present

## 2017-11-28 DIAGNOSIS — I252 Old myocardial infarction: Secondary | ICD-10-CM | POA: Diagnosis not present

## 2017-11-28 DIAGNOSIS — E1122 Type 2 diabetes mellitus with diabetic chronic kidney disease: Secondary | ICD-10-CM | POA: Diagnosis not present

## 2017-11-28 DIAGNOSIS — Z7902 Long term (current) use of antithrombotics/antiplatelets: Secondary | ICD-10-CM | POA: Diagnosis not present

## 2017-11-28 DIAGNOSIS — Z951 Presence of aortocoronary bypass graft: Secondary | ICD-10-CM | POA: Diagnosis not present

## 2017-11-28 DIAGNOSIS — Z79899 Other long term (current) drug therapy: Secondary | ICD-10-CM | POA: Diagnosis not present

## 2017-11-28 DIAGNOSIS — Z452 Encounter for adjustment and management of vascular access device: Secondary | ICD-10-CM | POA: Diagnosis present

## 2017-11-28 DIAGNOSIS — E1140 Type 2 diabetes mellitus with diabetic neuropathy, unspecified: Secondary | ICD-10-CM | POA: Diagnosis not present

## 2017-11-28 DIAGNOSIS — Y69 Unspecified misadventure during surgical and medical care: Secondary | ICD-10-CM | POA: Insufficient documentation

## 2017-11-28 DIAGNOSIS — Z7982 Long term (current) use of aspirin: Secondary | ICD-10-CM | POA: Insufficient documentation

## 2017-11-28 DIAGNOSIS — T82838A Hemorrhage of vascular prosthetic devices, implants and grafts, initial encounter: Secondary | ICD-10-CM | POA: Diagnosis not present

## 2017-11-28 LAB — CBC WITH DIFFERENTIAL/PLATELET
ABS IMMATURE GRANULOCYTES: 0.02 10*3/uL (ref 0.00–0.07)
Basophils Absolute: 0.1 10*3/uL (ref 0.0–0.1)
Basophils Relative: 1 %
Eosinophils Absolute: 0.1 10*3/uL (ref 0.0–0.5)
Eosinophils Relative: 2 %
HCT: 36.1 % — ABNORMAL LOW (ref 39.0–52.0)
Hemoglobin: 11.2 g/dL — ABNORMAL LOW (ref 13.0–17.0)
IMMATURE GRANULOCYTES: 0 %
Lymphocytes Relative: 21 %
Lymphs Abs: 1.5 10*3/uL (ref 0.7–4.0)
MCH: 31 pg (ref 26.0–34.0)
MCHC: 31 g/dL (ref 30.0–36.0)
MCV: 100 fL (ref 80.0–100.0)
MONO ABS: 0.7 10*3/uL (ref 0.1–1.0)
MONOS PCT: 10 %
NEUTROS ABS: 4.6 10*3/uL (ref 1.7–7.7)
NEUTROS PCT: 66 %
PLATELETS: 172 10*3/uL (ref 150–400)
RBC: 3.61 MIL/uL — AB (ref 4.22–5.81)
RDW: 14.7 % (ref 11.5–15.5)
WBC: 7 10*3/uL (ref 4.0–10.5)
nRBC: 0 % (ref 0.0–0.2)

## 2017-11-28 LAB — I-STAT CHEM 8, ED
BUN: 18 mg/dL (ref 8–23)
CHLORIDE: 95 mmol/L — AB (ref 98–111)
Calcium, Ion: 1.02 mmol/L — ABNORMAL LOW (ref 1.15–1.40)
Creatinine, Ser: 4.9 mg/dL — ABNORMAL HIGH (ref 0.61–1.24)
GLUCOSE: 244 mg/dL — AB (ref 70–99)
HCT: 36 % — ABNORMAL LOW (ref 39.0–52.0)
HEMOGLOBIN: 12.2 g/dL — AB (ref 13.0–17.0)
Potassium: 3.9 mmol/L (ref 3.5–5.1)
SODIUM: 137 mmol/L (ref 135–145)
TCO2: 32 mmol/L (ref 22–32)

## 2017-11-28 NOTE — ED Notes (Signed)
ED Provider at bedside. 

## 2017-11-28 NOTE — ED Provider Notes (Signed)
[facility] EMERGENCY DEPARTMENT Provider Note   CSN: [id number] Arrival date & time: 11/28/17  1808     History   Chief Complaint Chief Complaint  Patient presents with  . Vascular Access Problem    HPI Aaron Long is a 77 y.o. male.  HPI   He is here for bleeding from his fistula, which started after dialysis, while at home.  His wife was unable to stop the bleeding by placing pressure on it.  She feels like he lost a lot of blood.  He denies headache, chest pain, shortness of breath, weakness or dizziness at the time of evaluation.  There are no other known modifying factors.  Past Medical History:  Diagnosis Date  . Abnormality of gait   . Anemia   . BPH (benign prostatic hypertrophy)   . Chronic combined systolic and diastolic CHF (congestive heart failure) ([place])   . Complication of anesthesia    "he gets delirious"  . Constipation    takes Miralax daily  . Coronary artery disease    a. s/p NSTEMI/CABGx4 in 2007 - LIMA-LAD, SVG-optional diagonal, SVVG-OM, SVG-dRCA. b. Nuc 05/2011: nonischemic. c. all bypass grafts widely patent, 80% stenosis involving the grafted OM proximal to SVG anastomosis, which backfills the distal LCx, moderately elevated LVEDP, EF 25%.   . Diabetic peripheral neuropathy ([place])   . Dieulafoy lesion of rectum 05/03/2015  . Disorder of bone and cartilage, unspecified   . DVT (deep venous thrombosis) ([place])    a. Lower extremity DVT in 2013.  [name] ESRD on dialysis [facility])    M-W-F  . Gangrene of toe (HCC)    dry  . Hemorrhoids   . Hx of colonic polyps   . Hyperlipidemia   . Hypertension   . Hypertrophy of prostate without urinary obstruction and other lower urinary tract symptoms (LUTS)   . Ischemic cardiomyopathy    a. EF 40% in 2007. b. 51% by nuc 05/2011. c. EF 25% by cath and 35-40% by echo in 2018.  [name] Kidney stones   . Lower limb amputation, below knee   . Myocardial infarction ([place]) 2005  . Neuromuscular disorder  ([place])    diabetic neuropathy  . NSTEMI (non-ST elevated myocardial infarction) ([place]) 04/2016  . PAD (peripheral artery disease) ([place])    a. R CEA 2007. b. Hx L BKA in 2013. c. s/p LE angioplasty in 2014. d. Hx R BKA in 2014.  [name] PAF (paroxysmal atrial fibrillation) ([place])    a. dx 06/2016.  [name] Peripheral neuropathy   . Prostate cancer ([place]) 2010  . Secondary hyperparathyroidism ([place])    Secondary Hyperpara- Thyroidism, Renal  . Type II diabetes mellitus ([place])     Patient Active Problem List   Diagnosis Date Noted  . Abdominal distention   . Generalized abdominal pain   . Hypervolemia   . Elevated LFTs 08/08/2017  . Cholelithiasis 08/08/2017  . Upper abdominal pain 06/30/2017  . New onset atrial fibrillation ([place]) 06/30/2016  . Atrial fibrillation ([place]) 06/29/2016  . Elevated troponin 04/10/2016  . Acute on chronic systolic and diastolic heart failure, NYHA class 4 ([place])   . Pulmonary edema   . NSTEMI (non-ST elevated myocardial infarction) ([place])   . CHF (congestive heart failure) ([place]) 04/09/2016  . Hereditary and idiopathic peripheral neuropathy 06/26/2015  . History of pressure ulcer 06/26/2015  . Dieulafoy lesion of rectum 05/03/2015  . Rectal bleeding 05/01/2015  . Acute blood loss anemia 05/01/2015  . Numbness and tingling  in right hand 11/05/2014  . Right hand pain 11/05/2014  . Carotid stenosis   . TIA (transient ischemic attack) 09/30/2014  . ESRD on dialysis ([place])   . Anemia of chronic disease 05/02/2014  . Insomnia due to stress 05/02/2014  . Physical deconditioning 04/25/2014  . Syncope 04/17/2014  . Chest pain 04/08/2014  . Status post bilateral below knee amputation ([place]) 04/02/2014  . Cervical disc disorder with radiculopathy of cervical region 08/07/2013  . Drug-induced constipation 06/13/2013  . DM type 2, uncontrolled, with renal complications ([place]) 65/78/4696  . Hypertensive renal disease 06/13/2013  . Hyperlipidemia LDL goal <70 12/06/2012  . Diabetes  mellitus with peripheral vascular disease ([place]) 09/06/2012  . GERD (gastroesophageal reflux disease) 09/06/2012  . Anemia in chronic renal disease 07/24/2012  . Atherosclerosis of native arteries of the extremities with ulceration(440.23) 06/01/2012  . Hyperkalemia 04/24/2012  . HTN (hypertension) 04/28/2011  . CAD (coronary artery disease) 04/28/2011  . PAD (peripheral artery disease) ([place]) 04/28/2011  . Prostate cancer ([place]) 04/28/2011    Past Surgical History:  Procedure Laterality Date  . ABDOMINAL AORTAGRAM N/A 05/28/2011   Procedure: ABDOMINAL Maxcine Ham;  Surgeon: Elam Dutch, MD;  Location: [facility] CATH LAB;  Service: Cardiovascular;  Laterality: N/A;  . ABDOMINAL AORTAGRAM N/A 06/02/2012   Procedure: ABDOMINAL Maxcine Ham;  Surgeon: Elam Dutch, MD;  Location: [facility] CATH LAB;  Service: Cardiovascular;  Laterality: N/A;  . ABDOMINAL AORTAGRAM N/A 06/09/2012   Procedure: ABDOMINAL Maxcine Ham;  Surgeon: Elam Dutch, MD;  Location: [facility] CATH LAB;  Service: Cardiovascular;  Laterality: N/A;  . AMPUTATION  06/14/2011   Procedure: AMPUTATION DIGIT;  Surgeon: Elam Dutch, MD;  Location: [facility] OR;  Service: Vascular;  Laterality: Left;  Amputation Left fifth toe  . AMPUTATION  06/16/2011   Procedure: AMPUTATION BELOW KNEE;  Surgeon: Elam Dutch, MD;  Location: [facility];  Service: Vascular;  Laterality: Left;  . AMPUTATION Right 06/12/2012   Procedure: AMPUTATION DIGIT;  Surgeon: Elam Dutch, MD;  Location: [facility];  Service: Vascular;  Laterality: Right;  GREAT TOE  . AMPUTATION Right 08/08/2012   Procedure: AMPUTATION BELOW KNEE;  Surgeon: Elam Dutch, MD;  Location: [facility];  Service: Vascular;  Laterality: Right;  . AV FISTULA PLACEMENT Right 04/19/2014   Procedure: RIGHT ARM ARTERIOVENOUS (AV) FISTULA CREATION;  Surgeon: Mal Misty, MD;  Location: [facility];  Service: Vascular;  Laterality: Right;  . AV FISTULA PLACEMENT Left 12/12/2014   Procedure: BRACHIOCEPHALIC ARTERIOVENOUS (AV)  FISTULA CREATION;  Surgeon: Mal Misty, MD;  Location: [facility];  Service: Vascular;  Laterality: Left;  . CARDIAC CATHETERIZATION  05/28/11  . CARDIAC CATHETERIZATION N/A 10/04/2014   Procedure: Left Heart Cath and Coronary Angiography;  Surgeon: Sherren Mocha, MD;  Location: [facility] CV LAB;  Service: Cardiovascular;  Laterality: N/A;  . CAROTID ENDARTERECTOMY Right 2005  . CATARACT EXTRACTION W/ INTRAOCULAR LENS  IMPLANT, BILATERAL Bilateral 2011  . COLONOSCOPY    . COLONOSCOPY N/A 05/03/2015   Procedure: COLONOSCOPY;  Surgeon: Gatha [place], MD;  Location: [facility];  Service: Endoscopy;  Laterality: N/A;  . CORONARY ARTERY BYPASS GRAFT  2005   CABG X4  . ENDARTERECTOMY Left 10/17/2014   Procedure: ENDARTERECTOMY CAROTID;  Surgeon: Mal Misty, MD;  Location: [facility];  Service: Vascular;  Laterality: Left;  . I&D EXTREMITY  01/31/2012   Procedure: IRRIGATION AND DEBRIDEMENT EXTREMITY;  Surgeon: Elam Dutch, MD;  Location: [facility];  Service: Vascular;  Laterality: Left;  I & D Left BKA   . INSERTION OF DIALYSIS CATHETER Right 04/19/2014   Procedure: INSERTION OF DIALYSIS CATHETER-INTERNAL JUGULAR;  Surgeon: Mal Misty, MD;  Location: [place];  Service: Vascular;  Laterality: Right;  . INSERTION OF DIALYSIS CATHETER N/A 11/06/2014   Procedure: INSERTION OF 23cm DIALYSIS CATHETER - right internal jugular;  Surgeon: Mal Misty, MD;  Location: [place];  Service: Vascular;  Laterality: N/A;  . KNEE CARTILAGE SURGERY Left 1964  . LEFT HEART CATH AND CORONARY ANGIOGRAPHY N/A 04/12/2016   Procedure: Left Heart Cath and Coronary Angiography;  Surgeon: Nelva Bush, MD;  Location: [facility] CV LAB;  Service: Cardiovascular;  Laterality: N/A;  . LIGATION OF ARTERIOVENOUS  FISTULA Right 11/06/2014   Procedure: LIGATION OF RADIOCEPHALIC ARTERIOVENOUS FISTULA;  Surgeon: Mal Misty, MD;  Location: [facility];  Service: Vascular;  Laterality: Right;  . LIGATION OF COMPETING BRANCHES OF  ARTERIOVENOUS FISTULA Right 06/19/2014   Procedure: Right Arm LIGATION OF COMPETING BRANCHES OF RADIOCEPHALIC ARTERIOVENOUS FISTULA;  Surgeon: Mal Misty, MD;  Location: [facility];  Service: Vascular;  Laterality: Right;  . PROSTATECTOMY  2009        Home Medications    Prior to Admission medications   Medication Sig Start Date End Date Taking? Authorizing Provider  aspirin EC 81 MG tablet Take 81 mg by mouth See admin instructions. Take one tablet (81 mg) by mouth on Sunday, Tuesday, Thursday, Saturday mornings, take one tablet on Monday, Wednesday, Friday after dialysis    [provider]  calcium acetate (PHOSLO) 667 MG capsule Take 667 mg by mouth 3 (three) times daily with meals.  08/27/15   [provider]  carvedilol (COREG) 6.25 MG tablet Take 1 tablet (6.25 mg total) by mouth 2 (two) times daily with a meal. 07/06/16   [name], MD  ferric citrate (AURYXIA) 1 GM 210 MG(Fe) tablet Take 420 mg by mouth 3 (three) times daily with meals.    [provider]  HYDROcodone-acetaminophen (NORCO/VICODIN) 5-325 MG tablet Take 1 tablet by mouth every 8 (eight) hours as needed (pain).  07/20/17   [provider]  insulin aspart (NOVOLOG FLEXPEN) 100 UNIT/ML FlexPen Inject 2-3 Units into the skin 3 (three) times daily with meals as needed for high blood sugar (CBG >150).    [provider]  Insulin Detemir (LEVEMIR FLEXTOUCH) 100 UNIT/ML Pen Inject 13 Units into the skin daily after breakfast.    [provider]  isosorbide mononitrate (IMDUR) 30 MG 24 hr tablet Take 1 tablet (30 mg total) by mouth daily. Patient taking differently: Take 30 mg by mouth See admin instructions. Take one tablet (30 mg) by mouth on Sunday, Tuesday, Thursday, Saturday mornings, take one tablet on Monday, Wednesday, Friday after dialysis 04/14/16   Rai, Ripudeep K, MD  polyethylene glycol (MIRALAX) packet Take 17 g by mouth daily. Patient taking differently: Take 17 g by  mouth daily as needed (constipation).  04/05/16   Estill Dooms, MD  pravastatin (PRAVACHOL) 40 MG tablet Take 40 mg by mouth See admin instructions. Take one tablet (40 mg) by mouth on Sunday, Tuesday, Thursday, Saturday mornings, take one tablet on Monday, Wednesday, Friday after dialysis    [provider]  pregabalin (LYRICA) 75 MG capsule Take 75 mg by mouth See admin instructions. Take one tablet (75 mg) by mouth on Sunday, Tuesday, Thursday, Saturday mornings, take one tablet on Monday, Wednesday, Friday after dialysis    [provider]  Promethazine-DM 6.25-15 MG/5ML SOLN Take 5 mLs by mouth 3 (three) times daily as needed (cough).  07/26/17   [provider]  QUEtiapine (SEROQUEL) 25 MG tablet Take 1 tablet (25 mg total) by mouth daily. Patient taking differently: Take 25 mg by mouth daily as needed (anxiety).  04/05/16   Estill Dooms, MD  warfarin (COUMADIN) 7.5 MG tablet Take 1 tablet (7.5 mg total) by mouth daily at 6 PM. 07/06/16   [name], MD    Family History Family History  Problem Relation Age of Onset  . Hyperlipidemia Mother   . Hypertension Mother   . Cancer Mother   . Hyperlipidemia Father   . Hypertension Father   . Kidney disease Father   . Heart disease Sister   . Alzheimer's disease Sister   . Cancer Brother   . Other Sister   . Coronary artery disease Neg Hx   . Anesthesia problems Neg Hx   . Hypotension Neg Hx   . Malignant hyperthermia Neg Hx   . Pseudochol deficiency Neg Hx     Social History Social History   Tobacco Use  . Smoking status: Former Smoker    Packs/day: 1.00    Years: 30.00    Pack years: 30.00    Types: Cigarettes    Last attempt to quit: 04/28/1991    Years since quitting: 26.6  . Smokeless tobacco: Never Used  Substance Use Topics  . Alcohol use: Yes    Comment: 04/17/2014 "might have a glass of wine at Christmas"  . Drug use: No     Allergies   Oxycodone   Review of Systems Review of  Systems  All other systems reviewed and are negative.    Physical Exam Updated Vital Signs BP 118/62   Pulse 86   Temp 98.3 F (36.8 C) (Oral)   Resp 12   Ht 5\' 8"  (1.727 m)   SpO2 98%   BMI 23.40 kg/m   Physical Exam  Constitutional: He is oriented to person, place, and time. He appears well-developed. No distress.  Elderly, frail  HENT:  Head: Normocephalic and atraumatic.  Right Ear: External ear normal.  Left Ear: External ear normal.  Eyes: Pupils are equal, round, and reactive to light. Conjunctivae and EOM are normal.  Neck: Normal range of motion and phonation normal. Neck supple.  Cardiovascular: Normal rate.  Large bandage on left upper arm removed, fistula has 2 punctures consistent with dialysis today, neither bleeding.  There is no associated swelling, deformity or drainage.  Pulmonary/Chest: Effort normal. He exhibits no bony tenderness.  Musculoskeletal: Normal range of motion.  Neurological: He is alert and oriented to person, place, and time. No cranial nerve deficit or sensory deficit. He exhibits normal muscle tone. Coordination normal.  Skin: Skin is warm, dry and intact.  Psychiatric: He has a normal mood and affect. His behavior is normal. Judgment and thought content normal.  Nursing note and vitals reviewed.    ED Treatments / Results  Labs (all labs ordered are listed, but only abnormal results are displayed) Labs Reviewed  CBC WITH DIFFERENTIAL/PLATELET - Abnormal; Notable for the following components:      Result Value   RBC 3.61 (*)    Hemoglobin 11.2 (*)    HCT 36.1 (*)    All other components within normal limits  I-STAT CHEM 8, ED - Abnormal; Notable for the following components:   Chloride 95 (*)    Creatinine, Ser 4.90 (*)  Glucose, Bld 244 (*)    Calcium, Ion 1.02 (*)    Hemoglobin 12.2 (*)    HCT 36.0 (*)    All other components within normal limits    EKG None  Radiology No results found.  Procedures Procedures  (including critical care time)  Medications Ordered in ED Medications - No data to display   Initial Impression / Assessment and Plan / ED Course  I have reviewed the triage vital signs and the nursing notes.  Pertinent labs & imaging results that were available during my care of the patient were reviewed by me and considered in my medical decision making (see chart for details).  Clinical Course as of Nov 29 2007  Mon Nov 28, 2017  2003 Normal except chloride low, creatinine high, glucose high, calcium low, hemoglobin low  I-stat Chem 8, ED(!) [EW]  2003 Normal except hemoglobin low  CBC with Differential(!) [EW]  2003 Trending of hemoglobin indicates that it is unchanged in the last 3 months.   [EW]    Clinical Course User Index [EW] Daleen Bo, MD     Patient Vitals for the past 24 hrs:  BP Temp Temp src Pulse Resp SpO2 Height  11/28/17 1900 118/62 - - 86 - 98 % -  11/28/17 1845 109/63 - - 86 - 100 % -  11/28/17 1830 108/68 - - 85 - 100 % -  11/28/17 1815 (!) 101/54 - - 86 - 100 % -  11/28/17 1812 109/65 98.3 F (36.8 C) Oral 87 12 100 % -  11/28/17 1811 - - - - - - 5\' 8"  (1.727 m)    8:06 PM Reevaluation with update and discussion. After initial assessment and treatment, an updated evaluation reveals he is comfortable.  Bandage placed when I initially saw him is completely dry without bleeding.  Patient is asymptomatic at this time.  He requests to go home by ambulance. Findings discussed and all questions answered. Daleen Bo   Medical Decision Making: Bleeding fistula, spontaneously stopped.  No significant blood loss.  No indication for further ED treatment or evaluation at this time.  CRITICAL CARE-no Performed by: Daleen Bo   Nursing Notes Reviewed/ Care Coordinated Applicable Imaging Reviewed Interpretation of Laboratory Data incorporated into ED treatment  The patient appears reasonably screened and/or stabilized for discharge and I doubt any other  medical condition or other [facility] requiring further screening, evaluation, or treatment in the ED at this time prior to discharge.  Plan: Home Medications-continue routine medication; Home Treatments-continue dialysis; return here if the recommended treatment, does not improve the symptoms; Recommended follow up-PCP and nephrology, as needed     Final Clinical Impressions(s) / ED Diagnoses   Final diagnoses:  Bleeding from dialysis shunt, initial encounter [facility])    ED Discharge Orders    None       Daleen Bo, MD 11/28/17 2009

## 2017-11-28 NOTE — ED Triage Notes (Signed)
Pt to ED via EMS after having dialysis earlier today. Pt had full tx there and had some bleeding problems there but they were controlled at the facility. Pt went home after dialysis and was getting into bed when it started bleeding again. EMS dressing applied, bleeding through first layer on arrival to ED. Pt denies pain.

## 2017-11-30 DIAGNOSIS — D631 Anemia in chronic kidney disease: Secondary | ICD-10-CM | POA: Diagnosis not present

## 2017-11-30 DIAGNOSIS — N2581 Secondary hyperparathyroidism of renal origin: Secondary | ICD-10-CM | POA: Diagnosis not present

## 2017-11-30 DIAGNOSIS — E119 Type 2 diabetes mellitus without complications: Secondary | ICD-10-CM | POA: Diagnosis not present

## 2017-11-30 DIAGNOSIS — N186 End stage renal disease: Secondary | ICD-10-CM | POA: Diagnosis not present

## 2017-12-02 DIAGNOSIS — E119 Type 2 diabetes mellitus without complications: Secondary | ICD-10-CM | POA: Diagnosis not present

## 2017-12-02 DIAGNOSIS — N2581 Secondary hyperparathyroidism of renal origin: Secondary | ICD-10-CM | POA: Diagnosis not present

## 2017-12-02 DIAGNOSIS — D631 Anemia in chronic kidney disease: Secondary | ICD-10-CM | POA: Diagnosis not present

## 2017-12-02 DIAGNOSIS — N186 End stage renal disease: Secondary | ICD-10-CM | POA: Diagnosis not present

## 2017-12-04 DIAGNOSIS — Z7901 Long term (current) use of anticoagulants: Secondary | ICD-10-CM | POA: Diagnosis not present

## 2017-12-04 DIAGNOSIS — N186 End stage renal disease: Secondary | ICD-10-CM | POA: Diagnosis not present

## 2017-12-04 DIAGNOSIS — N2581 Secondary hyperparathyroidism of renal origin: Secondary | ICD-10-CM | POA: Diagnosis not present

## 2017-12-04 DIAGNOSIS — E119 Type 2 diabetes mellitus without complications: Secondary | ICD-10-CM | POA: Diagnosis not present

## 2017-12-04 DIAGNOSIS — D631 Anemia in chronic kidney disease: Secondary | ICD-10-CM | POA: Diagnosis not present

## 2017-12-04 DIAGNOSIS — I4891 Unspecified atrial fibrillation: Secondary | ICD-10-CM | POA: Diagnosis not present

## 2017-12-09 DIAGNOSIS — N186 End stage renal disease: Secondary | ICD-10-CM | POA: Diagnosis not present

## 2017-12-09 DIAGNOSIS — N2581 Secondary hyperparathyroidism of renal origin: Secondary | ICD-10-CM | POA: Diagnosis not present

## 2017-12-09 DIAGNOSIS — D631 Anemia in chronic kidney disease: Secondary | ICD-10-CM | POA: Diagnosis not present

## 2017-12-09 DIAGNOSIS — E119 Type 2 diabetes mellitus without complications: Secondary | ICD-10-CM | POA: Diagnosis not present

## 2017-12-11 ENCOUNTER — Emergency Department (HOSPITAL_COMMUNITY): Payer: Medicare Other

## 2017-12-11 ENCOUNTER — Inpatient Hospital Stay (HOSPITAL_COMMUNITY): Payer: Medicare Other

## 2017-12-11 DIAGNOSIS — I5042 Chronic combined systolic (congestive) and diastolic (congestive) heart failure: Secondary | ICD-10-CM | POA: Diagnosis present

## 2017-12-11 DIAGNOSIS — K5900 Constipation, unspecified: Secondary | ICD-10-CM | POA: Diagnosis present

## 2017-12-11 DIAGNOSIS — G9341 Metabolic encephalopathy: Secondary | ICD-10-CM | POA: Diagnosis present

## 2017-12-11 DIAGNOSIS — Z9911 Dependence on respirator [ventilator] status: Secondary | ICD-10-CM

## 2017-12-11 DIAGNOSIS — I9589 Other hypotension: Secondary | ICD-10-CM | POA: Diagnosis present

## 2017-12-11 DIAGNOSIS — E1165 Type 2 diabetes mellitus with hyperglycemia: Secondary | ICD-10-CM | POA: Diagnosis not present

## 2017-12-11 DIAGNOSIS — N400 Enlarged prostate without lower urinary tract symptoms: Secondary | ICD-10-CM | POA: Diagnosis present

## 2017-12-11 DIAGNOSIS — Z86010 Personal history of colonic polyps: Secondary | ICD-10-CM

## 2017-12-11 DIAGNOSIS — J9601 Acute respiratory failure with hypoxia: Secondary | ICD-10-CM | POA: Diagnosis present

## 2017-12-11 DIAGNOSIS — E875 Hyperkalemia: Secondary | ICD-10-CM | POA: Diagnosis present

## 2017-12-11 DIAGNOSIS — E874 Mixed disorder of acid-base balance: Secondary | ICD-10-CM | POA: Diagnosis present

## 2017-12-11 DIAGNOSIS — I480 Paroxysmal atrial fibrillation: Secondary | ICD-10-CM

## 2017-12-11 DIAGNOSIS — Z7901 Long term (current) use of anticoagulants: Secondary | ICD-10-CM

## 2017-12-11 DIAGNOSIS — R570 Cardiogenic shock: Principal | ICD-10-CM | POA: Diagnosis present

## 2017-12-11 DIAGNOSIS — K219 Gastro-esophageal reflux disease without esophagitis: Secondary | ICD-10-CM | POA: Diagnosis present

## 2017-12-11 DIAGNOSIS — X58XXXA Exposure to other specified factors, initial encounter: Secondary | ICD-10-CM | POA: Diagnosis present

## 2017-12-11 DIAGNOSIS — Z79899 Other long term (current) drug therapy: Secondary | ICD-10-CM

## 2017-12-11 DIAGNOSIS — I469 Cardiac arrest, cause unspecified: Secondary | ICD-10-CM | POA: Diagnosis not present

## 2017-12-11 DIAGNOSIS — Z951 Presence of aortocoronary bypass graft: Secondary | ICD-10-CM

## 2017-12-11 DIAGNOSIS — Y92009 Unspecified place in unspecified non-institutional (private) residence as the place of occurrence of the external cause: Secondary | ICD-10-CM

## 2017-12-11 DIAGNOSIS — I739 Peripheral vascular disease, unspecified: Secondary | ICD-10-CM | POA: Diagnosis present

## 2017-12-11 DIAGNOSIS — N186 End stage renal disease: Secondary | ICD-10-CM

## 2017-12-11 DIAGNOSIS — E1151 Type 2 diabetes mellitus with diabetic peripheral angiopathy without gangrene: Secondary | ICD-10-CM | POA: Diagnosis present

## 2017-12-11 DIAGNOSIS — Z992 Dependence on renal dialysis: Secondary | ICD-10-CM

## 2017-12-11 DIAGNOSIS — I132 Hypertensive heart and chronic kidney disease with heart failure and with stage 5 chronic kidney disease, or end stage renal disease: Secondary | ICD-10-CM | POA: Diagnosis present

## 2017-12-11 DIAGNOSIS — C61 Malignant neoplasm of prostate: Secondary | ICD-10-CM | POA: Diagnosis present

## 2017-12-11 DIAGNOSIS — I5043 Acute on chronic combined systolic (congestive) and diastolic (congestive) heart failure: Secondary | ICD-10-CM | POA: Diagnosis present

## 2017-12-11 DIAGNOSIS — T17998A Other foreign object in respiratory tract, part unspecified causing other injury, initial encounter: Secondary | ICD-10-CM | POA: Diagnosis present

## 2017-12-11 DIAGNOSIS — R0689 Other abnormalities of breathing: Secondary | ICD-10-CM | POA: Diagnosis not present

## 2017-12-11 DIAGNOSIS — I214 Non-ST elevation (NSTEMI) myocardial infarction: Secondary | ICD-10-CM | POA: Diagnosis present

## 2017-12-11 DIAGNOSIS — I248 Other forms of acute ischemic heart disease: Secondary | ICD-10-CM | POA: Diagnosis not present

## 2017-12-11 DIAGNOSIS — Z89511 Acquired absence of right leg below knee: Secondary | ICD-10-CM | POA: Diagnosis not present

## 2017-12-11 DIAGNOSIS — Z87442 Personal history of urinary calculi: Secondary | ICD-10-CM

## 2017-12-11 DIAGNOSIS — Z841 Family history of disorders of kidney and ureter: Secondary | ICD-10-CM

## 2017-12-11 DIAGNOSIS — E1142 Type 2 diabetes mellitus with diabetic polyneuropathy: Secondary | ICD-10-CM | POA: Diagnosis present

## 2017-12-11 DIAGNOSIS — Z794 Long term (current) use of insulin: Secondary | ICD-10-CM

## 2017-12-11 DIAGNOSIS — E1122 Type 2 diabetes mellitus with diabetic chronic kidney disease: Secondary | ICD-10-CM | POA: Diagnosis present

## 2017-12-11 DIAGNOSIS — I499 Cardiac arrhythmia, unspecified: Secondary | ICD-10-CM | POA: Diagnosis not present

## 2017-12-11 DIAGNOSIS — Z79891 Long term (current) use of opiate analgesic: Secondary | ICD-10-CM

## 2017-12-11 DIAGNOSIS — E876 Hypokalemia: Secondary | ICD-10-CM | POA: Diagnosis present

## 2017-12-11 DIAGNOSIS — N189 Chronic kidney disease, unspecified: Secondary | ICD-10-CM | POA: Diagnosis present

## 2017-12-11 DIAGNOSIS — Z8673 Personal history of transient ischemic attack (TIA), and cerebral infarction without residual deficits: Secondary | ICD-10-CM

## 2017-12-11 DIAGNOSIS — I5022 Chronic systolic (congestive) heart failure: Secondary | ICD-10-CM | POA: Diagnosis not present

## 2017-12-11 DIAGNOSIS — J9690 Respiratory failure, unspecified, unspecified whether with hypoxia or hypercapnia: Secondary | ICD-10-CM

## 2017-12-11 DIAGNOSIS — Z66 Do not resuscitate: Secondary | ICD-10-CM | POA: Diagnosis present

## 2017-12-11 DIAGNOSIS — Z89512 Acquired absence of left leg below knee: Secondary | ICD-10-CM

## 2017-12-11 DIAGNOSIS — E872 Acidosis: Secondary | ICD-10-CM | POA: Diagnosis not present

## 2017-12-11 DIAGNOSIS — E785 Hyperlipidemia, unspecified: Secondary | ICD-10-CM | POA: Diagnosis present

## 2017-12-11 DIAGNOSIS — I6782 Cerebral ischemia: Secondary | ICD-10-CM | POA: Diagnosis not present

## 2017-12-11 DIAGNOSIS — G931 Anoxic brain damage, not elsewhere classified: Secondary | ICD-10-CM | POA: Diagnosis present

## 2017-12-11 DIAGNOSIS — Z86718 Personal history of other venous thrombosis and embolism: Secondary | ICD-10-CM

## 2017-12-11 DIAGNOSIS — D631 Anemia in chronic kidney disease: Secondary | ICD-10-CM | POA: Diagnosis present

## 2017-12-11 DIAGNOSIS — Z4682 Encounter for fitting and adjustment of non-vascular catheter: Secondary | ICD-10-CM | POA: Diagnosis not present

## 2017-12-11 DIAGNOSIS — Z87891 Personal history of nicotine dependence: Secondary | ICD-10-CM

## 2017-12-11 DIAGNOSIS — I10 Essential (primary) hypertension: Secondary | ICD-10-CM | POA: Diagnosis present

## 2017-12-11 DIAGNOSIS — R4020 Unspecified coma: Secondary | ICD-10-CM | POA: Diagnosis not present

## 2017-12-11 DIAGNOSIS — R404 Transient alteration of awareness: Secondary | ICD-10-CM | POA: Diagnosis not present

## 2017-12-11 DIAGNOSIS — Z7982 Long term (current) use of aspirin: Secondary | ICD-10-CM

## 2017-12-11 DIAGNOSIS — Z8249 Family history of ischemic heart disease and other diseases of the circulatory system: Secondary | ICD-10-CM

## 2017-12-11 DIAGNOSIS — E11649 Type 2 diabetes mellitus with hypoglycemia without coma: Secondary | ICD-10-CM | POA: Diagnosis not present

## 2017-12-11 DIAGNOSIS — J984 Other disorders of lung: Secondary | ICD-10-CM | POA: Diagnosis not present

## 2017-12-11 DIAGNOSIS — I2510 Atherosclerotic heart disease of native coronary artery without angina pectoris: Secondary | ICD-10-CM | POA: Diagnosis present

## 2017-12-11 DIAGNOSIS — L8990 Pressure ulcer of unspecified site, unspecified stage: Secondary | ICD-10-CM | POA: Diagnosis present

## 2017-12-11 DIAGNOSIS — I255 Ischemic cardiomyopathy: Secondary | ICD-10-CM

## 2017-12-11 DIAGNOSIS — I252 Old myocardial infarction: Secondary | ICD-10-CM

## 2017-12-11 DIAGNOSIS — R0989 Other specified symptoms and signs involving the circulatory and respiratory systems: Secondary | ICD-10-CM | POA: Diagnosis present

## 2017-12-11 DIAGNOSIS — Z8546 Personal history of malignant neoplasm of prostate: Secondary | ICD-10-CM

## 2017-12-11 DIAGNOSIS — Z885 Allergy status to narcotic agent status: Secondary | ICD-10-CM

## 2017-12-11 DIAGNOSIS — Z9079 Acquired absence of other genital organ(s): Secondary | ICD-10-CM

## 2017-12-11 DIAGNOSIS — I120 Hypertensive chronic kidney disease with stage 5 chronic kidney disease or end stage renal disease: Secondary | ICD-10-CM | POA: Diagnosis not present

## 2017-12-11 DIAGNOSIS — I2489 Other forms of acute ischemic heart disease: Secondary | ICD-10-CM | POA: Diagnosis present

## 2017-12-11 LAB — PROCALCITONIN
Procalcitonin: 0.1 ng/mL
Procalcitonin: <0.1 ng/mL

## 2017-12-11 LAB — POCT I-STAT 3, ART BLOOD GAS (G3+)
Acid-base deficit: 8 mmol/L — ABNORMAL HIGH (ref 0.0–2.0)
Bicarbonate: 16.4 mmol/L — ABNORMAL LOW (ref 20.0–28.0)
O2 SAT: 99 %
Patient temperature: 98.4
TCO2: 17 mmol/L — AB (ref 22–32)
pCO2 arterial: 30.3 mmHg — ABNORMAL LOW (ref 32.0–48.0)
pH, Arterial: 7.34 — ABNORMAL LOW (ref 7.350–7.450)
pO2, Arterial: 127 mmHg — ABNORMAL HIGH (ref 83.0–108.0)

## 2017-12-11 LAB — I-STAT ARTERIAL BLOOD GAS, ED
ACID-BASE DEFICIT: 19 mmol/L — AB (ref 0.0–2.0)
Bicarbonate: 8.4 mmol/L — ABNORMAL LOW (ref 20.0–28.0)
O2 Saturation: 21 %
PH ART: 7.091 — AB (ref 7.350–7.450)
TCO2: 9 mmol/L — ABNORMAL LOW (ref 22–32)
pCO2 arterial: 27.8 mmHg — ABNORMAL LOW (ref 32.0–48.0)
pO2, Arterial: 21 mmHg — CL (ref 83.0–108.0)

## 2017-12-11 LAB — COMPREHENSIVE METABOLIC PANEL
ALK PHOS: 75 U/L (ref 38–126)
ALT: 19 U/L (ref 0–44)
AST: 20 U/L (ref 15–41)
Albumin: 3.6 g/dL (ref 3.5–5.0)
Anion gap: 10 (ref 5–15)
BILIRUBIN TOTAL: 0.2 mg/dL — AB (ref 0.3–1.2)
BUN: 5 mg/dL — ABNORMAL LOW (ref 8–23)
CALCIUM: 9.1 mg/dL (ref 8.9–10.3)
CO2: 26 mmol/L (ref 22–32)
Chloride: 102 mmol/L (ref 98–111)
Creatinine, Ser: 0.84 mg/dL (ref 0.61–1.24)
GFR calc non Af Amer: >60 mL/min (ref >60)
Glucose, Bld: 121 mg/dL — ABNORMAL HIGH (ref 70–99)
POTASSIUM: 3.8 mmol/L (ref 3.5–5.1)
SODIUM: 138 mmol/L (ref 135–145)
TOTAL PROTEIN: 8.1 g/dL (ref 6.5–8.1)

## 2017-12-11 LAB — CBC WITH DIFFERENTIAL/PLATELET
Abs Immature Granulocytes: 0.02 10*3/uL (ref 0.00–0.07)
BASOS PCT: 0 %
Basophils Absolute: 0 10*3/uL (ref 0.0–0.1)
EOS ABS: 0.3 10*3/uL (ref 0.0–0.5)
EOS PCT: 4 %
HEMATOCRIT: 46.6 % (ref 39.0–52.0)
Hemoglobin: 14.3 g/dL (ref 13.0–17.0)
Immature Granulocytes: 0 %
Lymphocytes Relative: 27 %
Lymphs Abs: 2.2 10*3/uL (ref 0.7–4.0)
MCH: 25.7 pg — ABNORMAL LOW (ref 26.0–34.0)
MCHC: 30.7 g/dL (ref 30.0–36.0)
MCV: 83.8 fL (ref 80.0–100.0)
Monocytes Absolute: 0.5 10*3/uL (ref 0.1–1.0)
Monocytes Relative: 6 %
Neutro Abs: 5.1 10*3/uL (ref 1.7–7.7)
Neutrophils Relative %: 63 %
PLATELETS: 330 10*3/uL (ref 150–400)
RBC: 5.56 MIL/uL (ref 4.22–5.81)
RDW: 14.4 % (ref 11.5–15.5)
WBC: 8.1 10*3/uL (ref 4.0–10.5)
nRBC: 0 % (ref 0.0–0.2)

## 2017-12-11 LAB — GLUCOSE, CAPILLARY
Glucose-Capillary: 471 mg/dL — ABNORMAL HIGH (ref 70–99)
Glucose-Capillary: 523 mg/dL (ref 70–99)
Glucose-Capillary: 457 mg/dL — ABNORMAL HIGH (ref 70–99)
Glucose-Capillary: 394 mg/dL — ABNORMAL HIGH (ref 70–99)

## 2017-12-11 LAB — PHOSPHORUS: Phosphorus: 8.7 mg/dL — ABNORMAL HIGH (ref 2.5–4.6)

## 2017-12-11 LAB — LIPASE, BLOOD: Lipase: 140 U/L — ABNORMAL HIGH (ref 11–51)

## 2017-12-11 LAB — I-STAT CG4 LACTIC ACID, ED: LACTIC ACID, VENOUS: 5.36 mmol/L — AB (ref 0.5–1.9)

## 2017-12-11 LAB — TROPONIN I: Troponin I: <0.03 ng/mL (ref <0.03)

## 2017-12-11 LAB — I-STAT TROPONIN, ED: Troponin i, poc: 0.84 ng/mL (ref 0.00–0.08)

## 2017-12-11 LAB — I-STAT CHEM 8, ED
BUN: 67 mg/dL — ABNORMAL HIGH (ref 8–23)
CALCIUM ION: 0.92 mmol/L — AB (ref 1.15–1.40)
Chloride: 99 mmol/L (ref 98–111)
Creatinine, Ser: 7.8 mg/dL — ABNORMAL HIGH (ref 0.61–1.24)
GLUCOSE: 282 mg/dL — AB (ref 70–99)
HCT: 31 % — ABNORMAL LOW (ref 39.0–52.0)
HEMOGLOBIN: 10.5 g/dL — AB (ref 13.0–17.0)
POTASSIUM: 5.7 mmol/L — AB (ref 3.5–5.1)
SODIUM: 131 mmol/L — AB (ref 135–145)
TCO2: 24 mmol/L (ref 22–32)

## 2017-12-11 LAB — BRAIN NATRIURETIC PEPTIDE: B Natriuretic Peptide: 3648.6 pg/mL — ABNORMAL HIGH (ref 0.0–100.0)

## 2017-12-11 LAB — AMYLASE: Amylase: 134 U/L — ABNORMAL HIGH (ref 28–100)

## 2017-12-11 LAB — TRIGLYCERIDES: Triglycerides: 73 mg/dL (ref <150)

## 2017-12-11 LAB — PROTIME-INR
INR: 0.99
Prothrombin Time: 13 seconds (ref 11.4–15.2)

## 2017-12-11 LAB — HEMOGLOBIN A1C
Hgb A1c MFr Bld: 7.6 % — ABNORMAL HIGH (ref 4.8–5.6)
Mean Plasma Glucose: 171.42 mg/dL

## 2017-12-11 LAB — MRSA PCR SCREENING: MRSA by PCR: NEGATIVE

## 2017-12-11 LAB — CBG MONITORING, ED: GLUCOSE-CAPILLARY: 315 mg/dL — AB (ref 70–99)

## 2017-12-11 LAB — MAGNESIUM: Magnesium: 2.2 mg/dL (ref 1.7–2.4)

## 2017-12-11 MED ORDER — ORAL CARE MOUTH RINSE
15.0000 mL | OROMUCOSAL | Status: DC
  Administered 2017-12-11 – 2017-12-16 (×45): 15 mL via OROMUCOSAL

## 2017-12-11 MED ORDER — NOREPINEPHRINE 4 MG/250ML-% IV SOLN
0.0000 ug/min | INTRAVENOUS | Status: DC
  Administered 2017-12-11: 20 ug/min via INTRAVENOUS

## 2017-12-11 MED ORDER — FENTANYL CITRATE (PF) 100 MCG/2ML IJ SOLN
50.0000 ug | INTRAMUSCULAR | Status: DC | PRN
  Administered 2017-12-11 (×2): 50 ug via INTRAVENOUS
  Filled 2017-12-11 (×2): qty 2

## 2017-12-11 MED ORDER — FENTANYL CITRATE (PF) 100 MCG/2ML IJ SOLN: 50.0000 ug | INTRAMUSCULAR | Status: DC | PRN

## 2017-12-11 MED ORDER — CHLORHEXIDINE GLUCONATE CLOTH 2 % EX PADS
6.0000 | MEDICATED_PAD | Freq: Every day | CUTANEOUS | Status: DC
  Administered 2017-12-12 – 2017-12-15 (×4): 6 via TOPICAL

## 2017-12-11 MED ORDER — HEPARIN SODIUM (PORCINE) 5000 UNIT/ML IJ SOLN
5000.0000 [IU] | Freq: Three times a day (TID) | INTRAMUSCULAR | Status: DC
  Administered 2017-12-11 – 2017-12-16 (×14): 5000 [IU] via SUBCUTANEOUS
  Filled 2017-12-11 (×14): qty 1

## 2017-12-11 MED ORDER — SODIUM BICARBONATE 8.4 % IV SOLN
INTRAVENOUS | Status: DC
  Administered 2017-12-11: 17:00:00 via INTRAVENOUS
  Filled 2017-12-11 (×2): qty 150

## 2017-12-11 MED ORDER — MIDAZOLAM HCL 2 MG/2ML IJ SOLN: 1.0000 mg | INTRAMUSCULAR | Status: DC | PRN

## 2017-12-11 MED ORDER — INSULIN REGULAR(HUMAN) IN NACL 100-0.9 UT/100ML-% IV SOLN
INTRAVENOUS | Status: DC
  Administered 2017-12-11: 4.6 [IU]/h via INTRAVENOUS
  Filled 2017-12-11: qty 100

## 2017-12-11 MED ORDER — SODIUM CHLORIDE 0.9% FLUSH: 10.0000 mL | INTRAVENOUS | Status: DC | PRN

## 2017-12-11 MED ORDER — NICARDIPINE HCL IN NACL 20-0.86 MG/200ML-% IV SOLN
3.0000 mg/h | INTRAVENOUS | Status: DC
  Administered 2017-12-11 – 2017-12-12 (×2): 3 mg/h via INTRAVENOUS
  Filled 2017-12-11 (×3): qty 200

## 2017-12-11 MED ORDER — INSULIN ASPART 100 UNIT/ML ~~LOC~~ SOLN
0.0000 [IU] | SUBCUTANEOUS | Status: DC
  Administered 2017-12-11: 15 [IU] via SUBCUTANEOUS

## 2017-12-11 MED ORDER — PROPOFOL 1000 MG/100ML IV EMUL: 0.0000 ug/kg/min | INTRAVENOUS | Status: DC

## 2017-12-11 MED ORDER — SODIUM BICARBONATE 8.4 % IV SOLN
INTRAVENOUS | Status: AC | PRN
  Administered 2017-12-11: 50 meq via INTRAVENOUS

## 2017-12-11 MED ORDER — CHLORHEXIDINE GLUCONATE 0.12% ORAL RINSE (MEDLINE KIT)
15.0000 mL | Freq: Two times a day (BID) | OROMUCOSAL | Status: DC
  Administered 2017-12-11 – 2017-12-16 (×10): 15 mL via OROMUCOSAL

## 2017-12-11 MED ORDER — FENTANYL CITRATE (PF) 100 MCG/2ML IJ SOLN
INTRAMUSCULAR | Status: AC
  Filled 2017-12-11: qty 2

## 2017-12-11 MED ORDER — FAMOTIDINE IN NACL 20-0.9 MG/50ML-% IV SOLN
20.0000 mg | Freq: Two times a day (BID) | INTRAVENOUS | Status: DC
  Administered 2017-12-11 – 2017-12-12 (×2): 20 mg via INTRAVENOUS
  Filled 2017-12-11 (×2): qty 50

## 2017-12-11 MED ORDER — SODIUM CHLORIDE 0.9% FLUSH
10.0000 mL | Freq: Two times a day (BID) | INTRAVENOUS | Status: DC
  Administered 2017-12-12: 20 mL
  Administered 2017-12-12: 10 mL
  Administered 2017-12-13: 30 mL

## 2017-12-11 MED ORDER — EPINEPHRINE PF 1 MG/ML IJ SOLN
0.5000 ug/min | INTRAVENOUS | Status: DC
  Filled 2017-12-11: qty 4

## 2017-12-11 MED ORDER — SODIUM CHLORIDE 0.9 % IV SOLN: INTRAVENOUS | Status: DC

## 2017-12-11 MED ORDER — EPINEPHRINE PF 1 MG/10ML IJ SOSY
PREFILLED_SYRINGE | INTRAMUSCULAR | Status: AC | PRN
  Administered 2017-12-11: 1 via INTRAVENOUS

## 2017-12-11 MED ORDER — PROPOFOL 1000 MG/100ML IV EMUL
INTRAVENOUS | Status: AC
  Filled 2017-12-11: qty 100

## 2017-12-11 NOTE — ED Provider Notes (Signed)
[facility] EMERGENCY DEPARTMENT Provider Note   CSN: [id number] Arrival date & time: 01/09/2018  1515     History   Chief Complaint No chief complaint on file.   HPI Aaron Long is a 77 y.o. male.  Pt presents to the ED today s/p cardiac arrest.  The pt was at home with his family drinking something.  He seemed to choke, then passed out.  The family called EMS.  When fire arrived, he initially had a pulse, then lost it.  When EMS arrived, he did not have a pulse.  They started CPR, placed a king airway, placed a right IO, gave 2 epis, and 1 bicarb as he is a dialysis patient.  They did CPR for 8 min when he had a ROSC.  The pt has a pulse upon arrival, but is unresponsive.     Past Medical History:  Diagnosis Date  . Abnormality of gait   . Anemia   . BPH (benign prostatic hypertrophy)   . Chronic combined systolic and diastolic CHF (congestive heart failure) ([place])   . Complication of anesthesia    "he gets delirious"  . Constipation    takes Miralax daily  . Coronary artery disease    a. s/p NSTEMI/CABGx4 in 2007 - LIMA-LAD, SVG-optional diagonal, SVVG-OM, SVG-dRCA. b. Nuc 05/2011: nonischemic. c. all bypass grafts widely patent, 80% stenosis involving the grafted OM proximal to SVG anastomosis, which backfills the distal LCx, moderately elevated LVEDP, EF 25%.   . Diabetic peripheral neuropathy ([place])   . Dieulafoy lesion of rectum 05/03/2015  . Disorder of bone and cartilage, unspecified   . DVT (deep venous thrombosis) ([place])    a. Lower extremity DVT in 2013.  [name] ESRD on dialysis [facility])    M-W-F  . Gangrene of toe (HCC)    dry  . Hemorrhoids   . Hx of colonic polyps   . Hyperlipidemia   . Hypertension   . Hypertrophy of prostate without urinary obstruction and other lower urinary tract symptoms (LUTS)   . Ischemic cardiomyopathy    a. EF 40% in 2007. b. 51% by nuc 05/2011. c. EF 25% by cath and 35-40% by echo in 2018.  [name] Kidney stones   . Lower  limb amputation, below knee   . Myocardial infarction ([place]) 2005  . Neuromuscular disorder ([place])    diabetic neuropathy  . NSTEMI (non-ST elevated myocardial infarction) ([place]) 04/2016  . PAD (peripheral artery disease) ([place])    a. R CEA 2007. b. Hx L BKA in 2013. c. s/p LE angioplasty in 2014. d. Hx R BKA in 2014.  [name] PAF (paroxysmal atrial fibrillation) ([place])    a. dx 06/2016.  [name] Peripheral neuropathy   . Prostate cancer ([place]) 2010  . Secondary hyperparathyroidism ([place])    Secondary Hyperpara- Thyroidism, Renal  . Type II diabetes mellitus ([place])     Patient Active Problem List   Diagnosis Date Noted  . Cardiac arrest ([place]) 01/06/2018  . Cardiogenic shock ([place])   . Abdominal distention   . Generalized abdominal pain   . Hypervolemia   . Elevated LFTs 08/08/2017  . Cholelithiasis 08/08/2017  . Upper abdominal pain 06/30/2017  . New onset atrial fibrillation ([place]) 06/30/2016  . Atrial fibrillation ([place]) 06/29/2016  . Elevated troponin 04/10/2016  . Acute on chronic systolic and diastolic heart failure, NYHA class 4 ([place])   . Pulmonary edema   . NSTEMI (non-ST elevated myocardial infarction) ([place])   . CHF (congestive  heart failure) ([place]) 04/09/2016  . Hereditary and idiopathic peripheral neuropathy 06/26/2015  . History of pressure ulcer 06/26/2015  . Dieulafoy lesion of rectum 05/03/2015  . Rectal bleeding 05/01/2015  . Acute blood loss anemia 05/01/2015  . Numbness and tingling in right hand 11/05/2014  . Right hand pain 11/05/2014  . Carotid stenosis   . TIA (transient ischemic attack) 09/30/2014  . ESRD on dialysis ([place])   . Anemia of chronic disease 05/02/2014  . Insomnia due to stress 05/02/2014  . Physical deconditioning 04/25/2014  . Syncope 04/17/2014  . Chest pain 04/08/2014  . Status post bilateral below knee amputation ([place]) 04/02/2014  . Cervical disc disorder with radiculopathy of cervical region 08/07/2013  . Drug-induced constipation 06/13/2013  . DM type  2, uncontrolled, with renal complications ([place]) 22/97/9892  . Hypertensive renal disease 06/13/2013  . Hyperlipidemia LDL goal <70 12/06/2012  . Diabetes mellitus with peripheral vascular disease ([place]) 09/06/2012  . GERD (gastroesophageal reflux disease) 09/06/2012  . Anemia in chronic renal disease 07/24/2012  . Atherosclerosis of native arteries of the extremities with ulceration(440.23) 06/01/2012  . Hyperkalemia 04/24/2012  . HTN (hypertension) 04/28/2011  . CAD (coronary artery disease) 04/28/2011  . PAD (peripheral artery disease) ([place]) 04/28/2011  . Prostate cancer ([place]) 04/28/2011    Past Surgical History:  Procedure Laterality Date  . ABDOMINAL AORTAGRAM N/A 05/28/2011   Procedure: ABDOMINAL Maxcine Ham;  Surgeon: Elam Dutch, MD;  Location[facility] CATH LAB;  Service: Cardiovascular;  Laterality: N/A;  . ABDOMINAL AORTAGRAM N/A 06/02/2012   Procedure: ABDOMINAL Maxcine Ham;  Surgeon: Elam Dutch, MD;  Location: [facility] CATH LAB;  Service: Cardiovascular;  Laterality: N/A;  . ABDOMINAL AORTAGRAM N/A 06/09/2012   Procedure: ABDOMINAL Maxcine Ham;  Surgeon: Elam Dutch, MD;  Location: [facility] CATH LAB;  Service: Cardiovascular;  Laterality: N/A;  . AMPUTATION  06/14/2011   Procedure: AMPUTATION DIGIT;  Surgeon: Elam Dutch, MD;  Location: [facility] OR;  Service: Vascular;  Laterality: Left;  Amputation Left fifth toe  . AMPUTATION  06/16/2011   Procedure: AMPUTATION BELOW KNEE;  Surgeon: Elam Dutch, MD;  Location: [facility];  Service: Vascular;  Laterality: Left;  . AMPUTATION Right 06/12/2012   Procedure: AMPUTATION DIGIT;  Surgeon: Elam Dutch, MD;  Location: [facility];  Service: Vascular;  Laterality: Right;  GREAT TOE  . AMPUTATION Right 08/08/2012   Procedure: AMPUTATION BELOW KNEE;  Surgeon: Elam Dutch, MD;  Location: [facility];  Service: Vascular;  Laterality: Right;  . AV FISTULA PLACEMENT Right 04/19/2014   Procedure: RIGHT ARM ARTERIOVENOUS (AV) FISTULA CREATION;  Surgeon: Mal Misty, MD;  Location: [facility];  Service: Vascular;  Laterality: Right;  . AV FISTULA PLACEMENT Left 12/12/2014   Procedure: BRACHIOCEPHALIC ARTERIOVENOUS (AV) FISTULA CREATION;  Surgeon: Mal Misty, MD;  Location: [facility];  Service: Vascular;  Laterality: Left;  . CARDIAC CATHETERIZATION  05/28/11  . CARDIAC CATHETERIZATION N/A 10/04/2014   Procedure: Left Heart Cath and Coronary Angiography;  Surgeon: Sherren Mocha, MD;  Location: [facility] CV LAB;  Service: Cardiovascular;  Laterality: N/A;  . CAROTID ENDARTERECTOMY Right 2005  . CATARACT EXTRACTION W/ INTRAOCULAR LENS  IMPLANT, BILATERAL Bilateral 2011  . COLONOSCOPY    . COLONOSCOPY N/A 05/03/2015   Procedure: COLONOSCOPY;  Surgeon: Gatha Mayer, MD;  Location: [facility];  Service: Endoscopy;  Laterality: N/A;  . CORONARY ARTERY BYPASS GRAFT  2005   CABG X4  . ENDARTERECTOMY Left 10/17/2014   Procedure: ENDARTERECTOMY CAROTID;  Surgeon:  Mal Misty, MD;  Location: [facility];  Service: Vascular;  Laterality: Left;  . I&D EXTREMITY  01/31/2012   Procedure: IRRIGATION AND DEBRIDEMENT EXTREMITY;  Surgeon: Elam Dutch, MD;  Location: [facility];  Service: Vascular;  Laterality: Left;  I & D Left BKA   . INSERTION OF DIALYSIS CATHETER Right 04/19/2014   Procedure: INSERTION OF DIALYSIS CATHETER-INTERNAL JUGULAR;  Surgeon: Mal Misty, MD;  Location: [facility];  Service: Vascular;  Laterality: Right;  . INSERTION OF DIALYSIS CATHETER N/A 11/06/2014   Procedure: INSERTION OF 23cm DIALYSIS CATHETER - right internal jugular;  Surgeon: Mal Misty, MD;  Location: [facility];  Service: Vascular;  Laterality: N/A;  . KNEE CARTILAGE SURGERY Left 1964  . LEFT HEART CATH AND CORONARY ANGIOGRAPHY N/A 04/12/2016   Procedure: Left Heart Cath and Coronary Angiography;  Surgeon: Nelva Bush, MD;  Location: [facility] CV LAB;  Service: Cardiovascular;  Laterality: N/A;  . LIGATION OF ARTERIOVENOUS  FISTULA Right 11/06/2014   Procedure: LIGATION OF  RADIOCEPHALIC ARTERIOVENOUS FISTULA;  Surgeon: Mal Misty, MD;  Location: [facility];  Service: Vascular;  Laterality: Right;  . LIGATION OF COMPETING BRANCHES OF ARTERIOVENOUS FISTULA Right 06/19/2014   Procedure: Right Arm LIGATION OF COMPETING BRANCHES OF RADIOCEPHALIC ARTERIOVENOUS FISTULA;  Surgeon: Mal Misty, MD;  Location: [facility];  Service: Vascular;  Laterality: Right;  . PROSTATECTOMY  2009        Home Medications    Prior to Admission medications   Medication Sig Start Date End Date Taking? Authorizing Provider  aspirin EC 81 MG tablet Take 81 mg by mouth See admin instructions. Take one tablet (81 mg) by mouth on Sunday, Tuesday, Thursday, Saturday mornings, take one tablet on Monday, Wednesday, Friday after dialysis    [provider]  calcium acetate (PHOSLO) 667 MG capsule Take 667 mg by mouth 3 (three) times daily with meals.  08/27/15   [provider]  carvedilol (COREG) 6.25 MG tablet Take 1 tablet (6.25 mg total) by mouth 2 (two) times daily with a meal. 07/06/16   [name], MD  ferric citrate (AURYXIA) 1 GM 210 MG(Fe) tablet Take 420 mg by mouth 3 (three) times daily with meals.    [provider]  HYDROcodone-acetaminophen (NORCO/VICODIN) 5-325 MG tablet Take 1 tablet by mouth every 8 (eight) hours as needed (pain).  07/20/17   [provider]  insulin aspart (NOVOLOG FLEXPEN) 100 UNIT/ML FlexPen Inject 2-3 Units into the skin 3 (three) times daily with meals as needed for high blood sugar (CBG >150).    [provider]  Insulin Detemir (LEVEMIR FLEXTOUCH) 100 UNIT/ML Pen Inject 13 Units into the skin daily after breakfast.    [provider]  isosorbide mononitrate (IMDUR) 30 MG 24 hr tablet Take 1 tablet (30 mg total) by mouth daily. Patient taking differently: Take 30 mg by mouth See admin instructions. Take one tablet (30 mg) by mouth on Sunday, Tuesday, Thursday, Saturday mornings, take one tablet on Monday,  Wednesday, Friday after dialysis 04/14/16   Rai, Ripudeep K, MD  polyethylene glycol (MIRALAX) packet Take 17 g by mouth daily. Patient taking differently: Take 17 g by mouth daily as needed (constipation).  04/05/16   Estill Dooms, MD  pravastatin (PRAVACHOL) 40 MG tablet Take 40 mg by mouth See admin instructions. Take one tablet (40 mg) by mouth on Sunday, Tuesday, Thursday, Saturday mornings, take one tablet on Monday, Wednesday, Friday after dialysis    [provider]  pregabalin (LYRICA) 75 MG capsule Take 75 mg by mouth See admin instructions. Take one tablet (75 mg) by mouth on Sunday, Tuesday, Thursday, Saturday mornings, take one tablet on Monday, Wednesday, Friday after dialysis    [provider]  Promethazine-DM 6.25-15 MG/5ML SOLN Take 5 mLs by mouth 3 (three) times daily as needed (cough).  07/26/17   [provider]  QUEtiapine (SEROQUEL) 25 MG tablet Take 1 tablet (25 mg total) by mouth daily. Patient taking differently: Take 25 mg by mouth daily as needed (anxiety).  04/05/16   Estill Dooms, MD  warfarin (COUMADIN) 7.5 MG tablet Take 1 tablet (7.5 mg total) by mouth daily at 6 PM. 07/06/16   [name], MD    Family History Family History  Problem Relation Age of Onset  . Hyperlipidemia Mother   . Hypertension Mother   . Cancer Mother   . Hyperlipidemia Father   . Hypertension Father   . Kidney disease Father   . Heart disease Sister   . Alzheimer's disease Sister   . Cancer Brother   . Other Sister   . Coronary artery disease Neg Hx   . Anesthesia problems Neg Hx   . Hypotension Neg Hx   . Malignant hyperthermia Neg Hx   . Pseudochol deficiency Neg Hx     Social History Social History   Tobacco Use  . Smoking status: Former Smoker    Packs/day: 1.00    Years: 30.00    Pack years: 30.00    Types: Cigarettes    Last attempt to quit: 04/28/1991    Years since quitting: 26.6  . Smokeless tobacco: Never Used  Substance Use Topics    . Alcohol use: Yes    Comment: 04/17/2014 "might have a glass of wine at Christmas"  . Drug use: No     Allergies   Oxycodone   Review of Systems Review of Systems  Unable to perform ROS: Patient unresponsive     Physical Exam Updated Vital Signs BP (!) 215/104   Pulse (!) 114   Resp 14   SpO2 92%   Physical Exam  Constitutional: He appears distressed.  HENT:  Head: Normocephalic.  Right Ear: External ear normal.  Left Ear: External ear normal.  Nose: Nose normal.  King airway in place  Eyes: Conjunctivae are normal.  Neck: No tracheal deviation present.  Cardiovascular: Normal rate, regular rhythm, normal heart sounds and intact distal pulses.  Pulmonary/Chest:  No spontaneous resp  Abdominal: Soft. Bowel sounds are normal.  Musculoskeletal:  Bilateral BKA Left upper arm AVF with good thrill  Neurological: He is unresponsive.  Skin: Skin is warm.  Psychiatric:  Unable to assess  Nursing note and vitals reviewed.    ED Treatments / Results  Labs (all labs ordered are listed, but only abnormal results are displayed) Labs Reviewed  COMPREHENSIVE METABOLIC PANEL - Abnormal; Notable for the following components:      Result Value   Glucose, Bld 121 (*)    BUN 5 (*)    Total Bilirubin 0.2 (*)    All other components within normal limits  CBC WITH DIFFERENTIAL/PLATELET - Abnormal; Notable for the following components:   MCH 25.7 (*)    All other components within normal limits  HEMOGLOBIN A1C - Abnormal; Notable for the following components:   Hgb A1c MFr Bld 7.6 (*)    All other components within normal limits  I-STAT CG4 LACTIC ACID, ED - Abnormal; Notable for the  following components:   Lactic Acid, Venous 5.36 (*)    All other components within normal limits  I-STAT CHEM 8, ED - Abnormal; Notable for the following components:   Sodium 131 (*)    Potassium 5.7 (*)    BUN 67 (*)    Creatinine, Ser 7.80 (*)    Glucose, Bld 282 (*)    Calcium, Ion  0.92 (*)    Hemoglobin 10.5 (*)    HCT 31.0 (*)    All other components within normal limits  I-STAT TROPONIN, ED - Abnormal; Notable for the following components:   Troponin i, poc 0.84 (*)    All other components within normal limits  I-STAT ARTERIAL BLOOD GAS, ED - Abnormal; Notable for the following components:   pH, Arterial 7.091 (*)    pCO2 arterial 27.8 (*)    pO2, Arterial 21.0 (*)    Bicarbonate 8.4 (*)    TCO2 9 (*)    Acid-base deficit 19.0 (*)    All other components within normal limits  CBG MONITORING, ED - Abnormal; Notable for the following components:   Glucose-Capillary 315 (*)    All other components within normal limits  TROPONIN I  PROTIME-INR  TRIGLYCERIDES  MAGNESIUM  PHOSPHORUS  AMYLASE  LIPASE, BLOOD  TROPONIN I  TROPONIN I  TROPONIN I  PROCALCITONIN  BRAIN NATRIURETIC PEPTIDE  BLOOD GAS, ARTERIAL    EKG None  Radiology Dg Chest Portable 1 View  Result Date: 12/18/2017 CLINICAL DATA:  Post CPR from home after choking episode and becoming unresponsive. EXAM: PORTABLE CHEST 1 VIEW COMPARISON:  08/08/2016 FINDINGS: Sternotomy wires unchanged. Endotracheal tube has tip 1 cm above the carina. Enteric tube has side-port over the stomach in the left upper quadrant as tip is not visualized. Lungs are adequately inflated demonstrate patchy hazy prominence of the perihilar regions right worse than left which may be due to interstitial edema although infection is possible. No effusion or pneumothorax. Cardiomediastinal silhouette and remainder the exam is unchanged. IMPRESSION: Patchy hazy perihilar opacification right worse than left likely interstitial edema, although infection is possible. Tubes and lines as described. Note that the endotracheal tube has tip 1 cm above the carina and could be pulled back 2 cm. These results were called by telephone at the time of interpretation on 12/27/2017 at 4:10 pm to patient's nurse, Vicente Males, who verbally acknowledged these  results. Electronically Signed   By: Marin Olp M.D.   On: 01/01/2018 16:10    Procedures Procedure Name: Intubation Date/Time: 12/29/2017 4:23 PM Performed by: Isla Pence, MD Pre-anesthesia Checklist: Patient identified Oxygen Delivery Method: Ambu bag Preoxygenation: Pre-oxygenation with 100% oxygen Ventilation: Mask ventilation without difficulty Laryngoscope Size: Glidescope and 3 Grade View: Grade II Tube size: 7.5 mm Number of attempts: 1 Secured at: 23 cm Tube secured with: ETT holder Dental Injury: Teeth and Oropharynx as per pre-operative assessment  Comments: After obtaining CXR, tube was pulled back 2 cm.      (including critical care time)  Medications Ordered in ED Medications  propofol (DIPRIVAN) 1000 MG/100ML infusion (has no administration in time range)  insulin aspart (novoLOG) injection 0-15 Units (has no administration in time range)  heparin injection 5,000 Units (has no administration in time range)  famotidine (PEPCID) IVPB 20 mg premix (has no administration in time range)  0.9 %  sodium chloride infusion (has no administration in time range)  norepinephrine (LEVOPHED) 4mg  in D5W 262mL premix infusion (20 mcg/min Intravenous New Bag/Given 12/18/2017 1545)  fentaNYL (  SUBLIMAZE) injection 50 mcg (has no administration in time range)  fentaNYL (SUBLIMAZE) injection 50 mcg (has no administration in time range)  EPINEPHrine (ADRENALIN) 4 mg in dextrose 5 % 250 mL (0.016 mg/mL) infusion (has no administration in time range)  sodium bicarbonate 150 mEq in dextrose 5 % 1,000 mL infusion (has no administration in time range)  EPINEPHrine (ADRENALIN) 1 MG/10ML injection (1 Syringe Intravenous Given 12/22/2017 1557)  sodium bicarbonate injection (50 mEq Intravenous Given 12/26/2017 1557)     Initial Impression / Assessment and Plan / ED Course  I have reviewed the triage vital signs and the nursing notes.  Pertinent labs & imaging results that were available  during my care of the patient were reviewed by me and considered in my medical decision making (see chart for details).    Dr. Nelda Marseille (ICU) was in the ED seeing another patient when this patient arrived.  I notified him and he came to pt's room for admission.  The pt started to drop his hr and bp, so he was started on a levophed drip and was given another amp of bicarb.  I did speak with pt's wife and told her that he was not doing very well, but has a pulse and bp.    Dr. Nelda Marseille took over pt's care from here.  CRITICAL CARE Performed by: Isla Pence   Total critical care time: 45 minutes  Critical care time was exclusive of separately billable procedures and treating other patients.  Critical care was necessary to treat or prevent imminent or life-threatening deterioration.  Critical care was time spent personally by me on the following activities: development of treatment plan with patient and/or surrogate as well as nursing, discussions with consultants, evaluation of patient's response to treatment, examination of patient, obtaining history from patient or surrogate, ordering and performing treatments and interventions, ordering and review of laboratory studies, ordering and review of radiographic studies, pulse oximetry and re-evaluation of patient's condition.  Final Clinical Impressions(s) / ED Diagnoses   Final diagnoses:  Cardiac arrest [facility])  ESRD on hemodialysis [facility])    ED Discharge Orders    None       Isla Pence, MD 12/22/2017 1627

## 2017-12-11 NOTE — Code Documentation (Signed)
Pt to ER as post CPR from home, was reportedly sitting in his wheelchair "drinking something" when he "choked and went unresponsive," per EMS, on fire arrival he still had a pulse but they lost it, CPR was initiated, he received CPR for 8 minutes with 2 epis, 1 amp bicarb, and 1 L NS with ROSC. Arrives with pulse. King airway and right lower extremity IO.

## 2017-12-11 NOTE — Progress Notes (Signed)
RT NOTE: Charge RT attempted RB ABG x2 and was unsuccessful. RT will continue to monitor.

## 2017-12-11 NOTE — Procedures (Signed)
Central Venous Catheter Insertion Procedure Note Aaron Long [id number] 10-03-1940  Procedure: Insertion of Central Venous Catheter Indications: Drug and/or fluid administration  Procedure Details Consent: Unable to obtain consent because of emergent medical necessity. Time Out: Verified patient identification, verified procedure, site/side was marked, verified correct patient position, special equipment/implants available, medications/allergies/relevent history reviewed, required imaging and test results available.  Performed  Maximum sterile technique was used including antiseptics, cap, gloves, gown, hand hygiene, mask and sheet. Skin prep: Iodine solution; local anesthetic administered A antimicrobial bonded/coated triple lumen catheter was placed in the right femoral vein due to patient being a dialysis patient using the Seldinger technique.  Evaluation Blood flow good Complications: No apparent complications Patient did tolerate procedure well. Chest X-ray ordered to verify placement.  CXR: pending.  U/s used in placement  Aaron Long 12/15/2017, 4:12 PM

## 2017-12-11 NOTE — Progress Notes (Signed)
Spoke with the entire family, updated them.  After discussion, decision was made to make patient a LCB with no CPR/cardioversion but continue all other medical care.  Rush Farmer, M.D. [facility] Pulmonary/Critical Care Medicine. Pager: [phone]. After hours pager: [phone].

## 2017-12-11 NOTE — Progress Notes (Signed)
[facility] Progress Note Patient Name: Aaron Long DOB: 02-08-1940 MRN: [id number]   Date of Service  12/19/2017  HPI/Events of Note  Blood sugar 471 mg %  eICU Interventions  Insulin infusion order set.        Kerry Kass Ogan 12/13/2017, 8:28 PM

## 2017-12-11 NOTE — Consult Note (Signed)
Renal Service Consult Note [name]  Aaron Long 01/07/2018 Sol Blazing Requesting Physician:  Dr Nelda Marseille  Reason for Consult:  ESRD pt w/ cardiac arrest HPI: The patient is a 77 y.o. year-old w/ hx bilat BKA, CAD hx CABG, HTN, DM2, sdCHF, CEA, prostate Ca, DVT and afib had cardiac arrest at home this afternoon.  Had CPR ~10 min total, IV epi x 2, 1 amp bicarb and 1L NS then had ROSC.  Brought to ED and now admitted to ICU.  BP's were high in ED and was started on IV nicardipine.  Asked to see for ESRD.   ABG at 4 pm was 7.09/ 28/ 21, repeating now.   K+ 5.7, CO2 26.  Cr 7.8  BUN 67.  Phos 8.7  . BNP 3648 .  Trop <0.03.  Wbc 8k  Hb 14  plt 330  CXR showed > Patchy hazy perihilar opacification right worse than left likely interstitial edema, although infection is possible.  Repeat ABG done just now shows > 7.34/ 30/ 127    ROS  n/a   Past Medical History  Past Medical History:  Diagnosis Date  . Abnormality of gait   . Anemia   . BPH (benign prostatic hypertrophy)   . Chronic combined systolic and diastolic CHF (congestive heart failure) ([place])   . Complication of anesthesia    "he gets delirious"  . Constipation    takes Miralax daily  . Coronary artery disease    a. s/p NSTEMI/CABGx4 in 2007 - LIMA-LAD, SVG-optional diagonal, SVVG-OM, SVG-dRCA. b. Nuc 05/2011: nonischemic. c. all bypass grafts widely patent, 80% stenosis involving the grafted OM proximal to SVG anastomosis, which backfills the distal LCx, moderately elevated LVEDP, EF 25%.   . Diabetic peripheral neuropathy ([place])   . Dieulafoy lesion of rectum 05/03/2015  . Disorder of bone and cartilage, unspecified   . DVT (deep venous thrombosis) ([place])    a. Lower extremity DVT in 2013.  [name] ESRD on dialysis [facility])    M-W-F  . Gangrene of toe (HCC)    dry  . Hemorrhoids   . Hx of colonic polyps   . Hyperlipidemia   . Hypertension   . Hypertrophy of prostate without urinary obstruction and  other lower urinary tract symptoms (LUTS)   . Ischemic cardiomyopathy    a. EF 40% in 2007. b. 51% by nuc 05/2011. c. EF 25% by cath and 35-40% by echo in 2018.  [name] Kidney stones   . Lower limb amputation, below knee   . Myocardial infarction ([place]) 2005  . Neuromuscular disorder ([place])    diabetic neuropathy  . NSTEMI (non-ST elevated myocardial infarction) ([place]) 04/2016  . PAD (peripheral artery disease) ([place])    a. R CEA 2007. b. Hx L BKA in 2013. c. s/p LE angioplasty in 2014. d. Hx R BKA in 2014.  [name] PAF (paroxysmal atrial fibrillation) ([place])    a. dx 06/2016.  [name] Peripheral neuropathy   . Prostate cancer ([place]) 2010  . Secondary hyperparathyroidism ([place])    Secondary Hyperpara- Thyroidism, Renal  . Type II diabetes mellitus ([place])    Past Surgical History  Past Surgical History:  Procedure Laterality Date  . ABDOMINAL AORTAGRAM N/A 05/28/2011   Procedure: ABDOMINAL Maxcine Ham;  Surgeon: Elam Dutch, MD;  Location: [facility] CATH LAB;  Service: Cardiovascular;  Laterality: N/A;  . ABDOMINAL AORTAGRAM N/A 06/02/2012   Procedure: ABDOMINAL Maxcine Ham;  Surgeon: Elam Dutch, MD;  Location: [facility] CATH LAB;  Service: Cardiovascular;  Laterality: N/A;  . ABDOMINAL AORTAGRAM N/A 06/09/2012   Procedure: ABDOMINAL Maxcine Ham;  Surgeon: Elam Dutch, MD;  Location: [facility] CATH LAB;  Service: Cardiovascular;  Laterality: N/A;  . AMPUTATION  06/14/2011   Procedure: AMPUTATION DIGIT;  Surgeon: Elam Dutch, MD;  Location: [facility] OR;  Service: Vascular;  Laterality: Left;  Amputation Left fifth toe  . AMPUTATION  06/16/2011   Procedure: AMPUTATION BELOW KNEE;  Surgeon: Elam Dutch, MD;  Location: [place];  Service: Vascular;  Laterality: Left;  . AMPUTATION Right 06/12/2012   Procedure: AMPUTATION DIGIT;  Surgeon: Elam Dutch, MD;  Location: [place];  Service: Vascular;  Laterality: Right;  GREAT TOE  . AMPUTATION Right 08/08/2012   Procedure: AMPUTATION BELOW KNEE;  Surgeon: Elam Dutch, MD;   Location: [place];  Service: Vascular;  Laterality: Right;  . AV FISTULA PLACEMENT Right 04/19/2014   Procedure: RIGHT ARM ARTERIOVENOUS (AV) FISTULA CREATION;  Surgeon: Mal Misty, MD;  Location: [place];  Service: Vascular;  Laterality: Right;  . AV FISTULA PLACEMENT Left 12/12/2014   Procedure: BRACHIOCEPHALIC ARTERIOVENOUS (AV) FISTULA CREATION;  Surgeon: Mal Misty, MD;  Location: [place];  Service: Vascular;  Laterality: Left;  . CARDIAC CATHETERIZATION  05/28/11  . CARDIAC CATHETERIZATION N/A 10/04/2014   Procedure: Left Heart Cath and Coronary Angiography;  Surgeon: Sherren Mocha, MD;  Location: [place] CV LAB;  Service: Cardiovascular;  Laterality: N/A;  . CAROTID ENDARTERECTOMY Right 2005  . CATARACT EXTRACTION W/ INTRAOCULAR LENS  IMPLANT, BILATERAL Bilateral 2011  . COLONOSCOPY    . COLONOSCOPY N/A 05/03/2015   Procedure: COLONOSCOPY;  Surgeon: Gatha Mayer, MD;  Location: [place];  Service: Endoscopy;  Laterality: N/A;  . CORONARY ARTERY BYPASS GRAFT  2005   CABG X4  . ENDARTERECTOMY Left 10/17/2014   Procedure: ENDARTERECTOMY CAROTID;  Surgeon: Mal Misty, MD;  Location: [place];  Service: Vascular;  Laterality: Left;  . I&D EXTREMITY  01/31/2012   Procedure: IRRIGATION AND DEBRIDEMENT EXTREMITY;  Surgeon: Elam Dutch, MD;  Location: [place];  Service: Vascular;  Laterality: Left;  I & D Left BKA   . INSERTION OF DIALYSIS CATHETER Right 04/19/2014   Procedure: INSERTION OF DIALYSIS CATHETER-INTERNAL JUGULAR;  Surgeon: Mal Misty, MD;  Location: [place];  Service: Vascular;  Laterality: Right;  . INSERTION OF DIALYSIS CATHETER N/A 11/06/2014   Procedure: INSERTION OF 23cm DIALYSIS CATHETER - right internal jugular;  Surgeon: Mal Misty, MD;  Location: [place];  Service: Vascular;  Laterality: N/A;  . KNEE CARTILAGE SURGERY Left 1964  . LEFT HEART CATH AND CORONARY ANGIOGRAPHY N/A 04/12/2016   Procedure: Left Heart Cath and Coronary Angiography;  Surgeon: Nelva Bush, MD;  Location: [place] CV LAB;  Service: Cardiovascular;  Laterality: N/A;  . LIGATION OF ARTERIOVENOUS  FISTULA Right 11/06/2014   Procedure: LIGATION OF RADIOCEPHALIC ARTERIOVENOUS FISTULA;  Surgeon: Mal Misty, MD;  Location: [place];  Service: Vascular;  Laterality: Right;  . LIGATION OF COMPETING BRANCHES OF ARTERIOVENOUS FISTULA Right 06/19/2014   Procedure: Right Arm LIGATION OF COMPETING BRANCHES OF RADIOCEPHALIC ARTERIOVENOUS FISTULA;  Surgeon: Mal Misty, MD;  Location: [place];  Service: Vascular;  Laterality: Right;  . PROSTATECTOMY  2009   Family History  Family History  Problem Relation Age of Onset  . Hyperlipidemia Mother   . Hypertension Mother   . Cancer Mother   . Hyperlipidemia Father   . Hypertension Father   .  Kidney disease Father   . Heart disease Sister   . Alzheimer's disease Sister   . Cancer Brother   . Other Sister   . Coronary artery disease Neg Hx   . Anesthesia problems Neg Hx   . Hypotension Neg Hx   . Malignant hyperthermia Neg Hx   . Pseudochol deficiency Neg Hx    Social History  reports that he quit smoking about 26 years ago. His smoking use included cigarettes. He has a 30.00 pack-year smoking history. He has never used smokeless tobacco. He reports that he drinks alcohol. He reports that he does not use drugs. Allergies  Allergies  Allergen Reactions  . Oxycodone Other (See Comments)    Hallucinations   Home medications Prior to Admission medications   Medication Sig Start Date End Date Taking? Authorizing Provider  aspirin EC 81 MG tablet Take 81 mg by mouth See admin instructions. Take one tablet (81 mg) by mouth on Sunday, Tuesday, Thursday, Saturday mornings, take one tablet on Monday, Wednesday, Friday after dialysis    [provider]  calcium acetate (PHOSLO) 667 MG capsule Take 667 mg by mouth 3 (three) times daily with meals.  08/27/15   [provider]  carvedilol (COREG) 6.25 MG tablet Take 1 tablet  (6.25 mg total) by mouth 2 (two) times daily with a meal. 07/06/16   [name], MD  ferric citrate (AURYXIA) 1 GM 210 MG(Fe) tablet Take 420 mg by mouth 3 (three) times daily with meals.    [provider]  HYDROcodone-acetaminophen (NORCO/VICODIN) 5-325 MG tablet Take 1 tablet by mouth every 8 (eight) hours as needed (pain).  07/20/17   [provider]  insulin aspart (NOVOLOG FLEXPEN) 100 UNIT/ML FlexPen Inject 2-3 Units into the skin 3 (three) times daily with meals as needed for high blood sugar (CBG >150).    [provider]  Insulin Detemir (LEVEMIR FLEXTOUCH) 100 UNIT/ML Pen Inject 13 Units into the skin daily after breakfast.    [provider]  isosorbide mononitrate (IMDUR) 30 MG 24 hr tablet Take 1 tablet (30 mg total) by mouth daily. Patient taking differently: Take 30 mg by mouth See admin instructions. Take one tablet (30 mg) by mouth on Sunday, Tuesday, Thursday, Saturday mornings, take one tablet on Monday, Wednesday, Friday after dialysis 04/14/16   Rai, Ripudeep K, MD  polyethylene glycol (MIRALAX) packet Take 17 g by mouth daily. Patient taking differently: Take 17 g by mouth daily as needed (constipation).  04/05/16   Estill Dooms, MD  pravastatin (PRAVACHOL) 40 MG tablet Take 40 mg by mouth See admin instructions. Take one tablet (40 mg) by mouth on Sunday, Tuesday, Thursday, Saturday mornings, take one tablet on Monday, Wednesday, Friday after dialysis    [provider]  pregabalin (LYRICA) 75 MG capsule Take 75 mg by mouth See admin instructions. Take one tablet (75 mg) by mouth on Sunday, Tuesday, Thursday, Saturday mornings, take one tablet on Monday, Wednesday, Friday after dialysis    [provider]  Promethazine-DM 6.25-15 MG/5ML SOLN Take 5 mLs by mouth 3 (three) times daily as needed (cough).  07/26/17   [provider]  QUEtiapine (SEROQUEL) 25 MG tablet Take 1 tablet (25 mg total) by mouth daily. Patient  taking differently: Take 25 mg by mouth daily as needed (anxiety).  04/05/16   Estill Dooms, MD  warfarin (COUMADIN) 7.5 MG tablet Take 1 tablet (7.5 mg total) by mouth daily at 6 PM. 07/06/16   Karleen Hampshire,  Jeoffrey Massed, MD   Liver Function Tests Recent Labs  Lab 01/04/2018 1539  AST 20  ALT 19  ALKPHOS 75  BILITOT 0.2*  PROT 8.1  ALBUMIN 3.6   Recent Labs  Lab 12/12/2017 1553  LIPASE 140*  AMYLASE 134*   CBC Recent Labs  Lab 01/07/2018 1539 12/26/2017 1558  WBC 8.1  --   NEUTROABS 5.1  --   HGB 14.3 10.5*  HCT 46.6 31.0*  MCV 83.8  --   PLT 330  --    Basic Metabolic Panel Recent Labs  Lab 12/28/2017 1539 01/08/2018 1553 12/21/2017 1558  NA 138  --  131*  K 3.8  --  5.7*  CL 102  --  99  CO2 26  --   --   GLUCOSE 121*  --  282*  BUN 5*  --  67*  CREATININE 0.84  --  7.80*  CALCIUM 9.1  --   --   PHOS  --  8.7*  --    Iron/TIBC/Ferritin/ %Sat    Component Value Date/Time   IRON 27 (L) 04/18/2014 1304   TIBC 240 04/18/2014 1304   FERRITIN 95 04/18/2014 1304   IRONPCTSAT 11 (L) 04/18/2014 1304    Vitals:   12/15/2017 1751 12/13/2017 1800 01/09/2018 1805 12/26/2017 1900  BP:  (!) 137/122  132/78  Pulse:      Resp:  (!) 24 (!) 22 (!) 24  SpO2: 100% 100%  100%   Exam Gen on vent, not responding, gaze upwards, pupils react minimally No rash, cyanosis or gangrene Sclera anicteric, throat w ETT in place  No jvd or bruits Chest clear bilat ant and post RRR no MRG Abd soft ntnd no mass or ascites +bs GU normal male MS no joint effusions or deformity Ext bilat BKA, no stump edema, no wounds or ulcers Neuro is not responsive, on the vent LUA AVF+ bruit    Home meds:  - carvedilol 6.25 bid  - pravastatin 40/ aspirin 81/ isosorbide mononitrate 30 qd  - hydrocodone-aceta prn tid/ pregabalin 75mg  / quetiapine 25 qd prn  - warfarin 7.5 hs  - insulin detemir 13u qd/ insulin aspart 2-3u tid ac  - ferric citrate 420mg  tid ac/ calc acetate 667 tid ac    Dialysis: MWF SW  4h   450/800  67.5kg  2/2.25  Hep none  LUA AVF - last Hb 10.8, 31% tsat - sensipar 60 tiw - hect 6 ug - mircera 30 ug on 11/18 ?still getting   Impression: 1. Cardiac arrest - out of hospital , 10 min to ROSC approx. On vent in ICU 2. ESRD on HD- next HD tomorrow. Use LUA AVF.   3. Metabolic acidosis - 1st abg was post -arrest, 2nd ABG now is better. No indication for RRT.   4. Pulm infiltrates - could be edema, PNA or aspiration PNA related to arrest 5. HTN - on nicardipine gtt 6. DM2 on insulin 7. PAF  8. Hx MI / remote CABG 2005/ ICM 9. PAD bilat BKA 10. H/o prostate cancer     Kelly Splinter MD [facility] pager [phone]   12/22/2017, 7:46 PM

## 2017-12-11 NOTE — H&P (Signed)
NAME:  Aaron Long, MRN:  [id number], DOB:  [birth year], LOS: 0 ADMISSION DATE:  12/18/2017, CONSULTATION DATE:  12/23/2017 REFERRING MD:  EDP - [place], CHIEF COMPLAINT:  Cardiac arrest   Brief History   77 year old male with PMH of ESRD who was in his chair at home drinking something then chocked and fell off the chair.  EMS was called, wen fire arrived the patient had pulse but when EMS arrived he was pulseless and CPR for 8 minutes were started.  After 2 of epi and one of bicarb the patient had ROSC and PCCM was called to admit.  When patient was brought to the ER he was given etomidate to change the ETT but prior to that he was purposeful per EDP.  PCCM will admit.  History of present illness   77 year old male with PMH of ESRD who was in his chair at home drinking something then chocked and fell off the chair.  EMS was called, wen fire arrived the patient had pulse but when EMS arrived he was pulseless and CPR for 8 minutes were started.  After 2 of epi and one of bicarb the patient had ROSC and PCCM was called to admit.  When patient was brought to the ER he was given etomidate to change the ETT but prior to that he was purposeful per EDP.  PCCM will admit.  Past Medical History  ESRD CAD HTN  Significant Hospital Events   12/1 cardiac arrest  Consults:  Cardiology Renal  Procedures:  None  Significant Diagnostic Tests:  Head CT 12/1>>>  Micro Data:  None  Antimicrobials:  None   Interim history/subjective:  No further events since arriving to the ED  Objective   There were no vitals taken for this visit.       No intake or output data in the 24 hours ending 12/29/2017 1532 There were no vitals filed for this visit.  Examination: General: Acute on chronically ill appearing male, NAD HENT: [place]/AT, pupils are sluggish, corneal reflex present and gag present. Lungs: Coarse BS diffusely Cardiovascular:  Sinus brady, Nl S1/S2 and -M/R/G Abdomen: Soft, NT, ND and  +BS Extremities: -edema and -tenderness, bilateral BKA Neuro: Reaches for ETT but not following commands Skin: Intact  Resolved Hospital Problem list   N/A  Assessment & Plan:  78 year old male with ESRD who presents after chocking at home resulting in what appears to be asystole or PEA (pulsless with no shock advised).  Patient is in cardiogenic shock requiring an epi drip.  Cardiac arrest:  - Tele monitoring  - No cooling due to refractory shock  - Cardiology consult called  Cardiogenic shock:  - Place TLC  - Epi drip  - Bicarb drip  Acute respiratory failure:  - Full vent support  - Increase MV for metabolic acidosis  - Titrate O2 for sat of 88-92%  - Maintain PEEP at 5  ESRD:  - Bicarb drip  - Nephrology consult called  - Replace electrolytes as BMET results  GOC: full code per wife's wishes  Labs   CBC: No results for input(s): WBC, NEUTROABS, HGB, HCT, MCV, PLT in the last 168 hours.  Basic Metabolic Panel: No results for input(s): NA, K, CL, CO2, GLUCOSE, BUN, CREATININE, CALCIUM, MG, PHOS in the last 168 hours. GFR: CrCl cannot be calculated (Unknown ideal weight.). No results for input(s): PROCALCITON, WBC, LATICACIDVEN in the last 168 hours.  Liver Function Tests: No results for input(s):  AST, ALT, ALKPHOS, BILITOT, PROT, ALBUMIN in the last 168 hours. No results for input(s): LIPASE, AMYLASE in the last 168 hours. No results for input(s): AMMONIA in the last 168 hours.  ABG    Component Value Date/Time   TCO2 32 11/28/2017 1916     Coagulation Profile: No results for input(s): INR, PROTIME in the last 168 hours.  Cardiac Enzymes: No results for input(s): CKTOTAL, CKMB, CKMBINDEX, TROPONINI in the last 168 hours.  HbA1C: Hgb A1c MFr Bld  Date/Time Value Ref Range Status  08/09/2017 05:32 AM 7.7 (H) 4.8 - 5.6 % Final    Comment:    (NOTE) Pre diabetes:          5.7%-6.4% Diabetes:              >6.4% Glycemic control for   <7.0% adults  with diabetes   04/11/2016 04:32 AM 10.7 (H) 4.8 - 5.6 % Final    Comment:    (NOTE)         Pre-diabetes: 5.7 - 6.4         Diabetes: >6.4         Glycemic control for adults with diabetes: <7.0     CBG: No results for input(s): GLUCAP in the last 168 hours.  Review of Systems:   Not attainable  Past Medical History  He,  has a past medical history of Abnormality of gait, Anemia, BPH (benign prostatic hypertrophy), Chronic combined systolic and diastolic CHF (congestive heart failure) ([place]), Complication of anesthesia, Constipation, Coronary artery disease, Diabetic peripheral neuropathy ([place]), Dieulafoy lesion of rectum (05/03/2015), Disorder of bone and cartilage, unspecified, DVT (deep venous thrombosis) ([place]), ESRD on dialysis ([place]), Gangrene of toe ([place]), Hemorrhoids, colonic polyps, Hyperlipidemia, Hypertension, Hypertrophy of prostate without urinary obstruction and other lower urinary tract symptoms (LUTS), Ischemic cardiomyopathy, Kidney stones, Lower limb amputation, below knee, Myocardial infarction ([place]) (2005), Neuromuscular disorder ([place]), NSTEMI (non-ST elevated myocardial infarction) ([place]) (04/2016), PAD (peripheral artery disease) ([place]), PAF (paroxysmal atrial fibrillation) ([place]), Peripheral neuropathy, Prostate cancer ([place]) (2010), Secondary hyperparathyroidism ([place]), and Type II diabetes mellitus ([place]).   Surgical History    Past Surgical History:  Procedure Laterality Date  . ABDOMINAL AORTAGRAM N/A 05/28/2011   Procedure: ABDOMINAL Maxcine Ham;  Surgeon: Elam Dutch, MD;  Location: [facility] CATH LAB;  Service: Cardiovascular;  Laterality: N/A;  . ABDOMINAL AORTAGRAM N/A 06/02/2012   Procedure: ABDOMINAL Maxcine Ham;  Surgeon: Elam Dutch, MD;  Location: [facility] CATH LAB;  Service: Cardiovascular;  Laterality: N/A;  . ABDOMINAL AORTAGRAM N/A 06/09/2012   Procedure: ABDOMINAL Maxcine Ham;  Surgeon: Elam Dutch, MD;  [facility] CATH LAB;  Service: Cardiovascular;   Laterality: N/A;  . AMPUTATION  06/14/2011   Procedure: AMPUTATION DIGIT;  Surgeon: Elam Dutch, MD;  Location: [facility] OR;  Service: Vascular;  Laterality: Left;  Amputation Left fifth toe  . AMPUTATION  06/16/2011   Procedure: AMPUTATION BELOW KNEE;  Surgeon: Elam Dutch, MD;  Location: [place];  Service: Vascular;  Laterality: Left;  . AMPUTATION Right 06/12/2012   Procedure: AMPUTATION DIGIT;  Surgeon: Elam Dutch, MD;  Location: [place];  Service: Vascular;  Laterality: Right;  GREAT TOE  . AMPUTATION Right 08/08/2012   Procedure: AMPUTATION BELOW KNEE;  Surgeon: Elam Dutch, MD;  Location: [place];  Service: Vascular;  Laterality: Right;  . AV FISTULA PLACEMENT Right 04/19/2014   Procedure: RIGHT ARM ARTERIOVENOUS (AV) FISTULA CREATION;  Surgeon: Mal Misty, MD;  Location: [place];  Service:  Vascular;  Laterality: Right;  . AV FISTULA PLACEMENT Left 12/12/2014   Procedure: BRACHIOCEPHALIC ARTERIOVENOUS (AV) FISTULA CREATION;  Surgeon: Mal Misty, MD;  Location: [place];  Service: Vascular;  Laterality: Left;  . CARDIAC CATHETERIZATION  05/28/11  . CARDIAC CATHETERIZATION N/A 10/04/2014   Procedure: Left Heart Cath and Coronary Angiography;  Surgeon: Sherren Mocha, MD;  Location: [place] CV LAB;  Service: Cardiovascular;  Laterality: N/A;  . CAROTID ENDARTERECTOMY Right 2005  . CATARACT EXTRACTION W/ INTRAOCULAR LENS  IMPLANT, BILATERAL Bilateral 2011  . COLONOSCOPY    . COLONOSCOPY N/A 05/03/2015   Procedure: COLONOSCOPY;  Surgeon: Gatha Mayer, MD;  Location: [place];  Service: Endoscopy;  Laterality: N/A;  . CORONARY ARTERY BYPASS GRAFT  2005   CABG X4  . ENDARTERECTOMY Left 10/17/2014   Procedure: ENDARTERECTOMY CAROTID;  Surgeon: Mal Misty, MD;  Location: [place];  Service: Vascular;  Laterality: Left;  . I&D EXTREMITY  01/31/2012   Procedure: IRRIGATION AND DEBRIDEMENT EXTREMITY;  Surgeon: Elam Dutch, MD;  Location: [place];  Service: Vascular;  Laterality: Left;   I & D Left BKA   . INSERTION OF DIALYSIS CATHETER Right 04/19/2014   Procedure: INSERTION OF DIALYSIS CATHETER-INTERNAL JUGULAR;  Surgeon: Mal Misty, MD;  Location: [place];  Service: Vascular;  Laterality: Right;  . INSERTION OF DIALYSIS CATHETER N/A 11/06/2014   Procedure: INSERTION OF 23cm DIALYSIS CATHETER - right internal jugular;  Surgeon: Mal Misty, MD;  Location: [place];  Service: Vascular;  Laterality: N/A;  . KNEE CARTILAGE SURGERY Left 1964  . LEFT HEART CATH AND CORONARY ANGIOGRAPHY N/A 04/12/2016   Procedure: Left Heart Cath and Coronary Angiography;  Surgeon: Nelva Bush, MD;  Location: [place] CV LAB;  Service: Cardiovascular;  Laterality: N/A;  . LIGATION OF ARTERIOVENOUS  FISTULA Right 11/06/2014   Procedure: LIGATION OF RADIOCEPHALIC ARTERIOVENOUS FISTULA;  Surgeon: Mal Misty, MD;  Location: [place];  Service: Vascular;  Laterality: Right;  . LIGATION OF COMPETING BRANCHES OF ARTERIOVENOUS FISTULA Right 06/19/2014   Procedure: Right Arm LIGATION OF COMPETING BRANCHES OF RADIOCEPHALIC ARTERIOVENOUS FISTULA;  Surgeon: Mal Misty, MD;  Location: [place];  Service: Vascular;  Laterality: Right;  . PROSTATECTOMY  2009     Social History   reports that he quit smoking about 26 years ago. His smoking use included cigarettes. He has a 30.00 pack-year smoking history. He has never used smokeless tobacco. He reports that he drinks alcohol. He reports that he does not use drugs.   Family History   His family history includes Alzheimer's disease in his sister; Cancer in his brother and mother; Heart disease in his sister; Hyperlipidemia in his father and mother; Hypertension in his father and mother; Kidney disease in his father; Other in his sister. There is no history of Coronary artery disease, Anesthesia problems, Hypotension, Malignant hyperthermia, or Pseudochol deficiency.   Allergies Allergies  Allergen Reactions  . Oxycodone Other (See Comments)     Hallucinations     Home Medications  Prior to Admission medications   Medication Sig Start Date End Date Taking? Authorizing Provider  aspirin EC 81 MG tablet Take 81 mg by mouth See admin instructions. Take one tablet (81 mg) by mouth on Sunday, Tuesday, Thursday, Saturday mornings, take one tablet on Monday, Wednesday, Friday after dialysis    [provider]  calcium acetate (PHOSLO) 667 MG capsule Take 667 mg by mouth 3 (three) times daily with meals.  08/27/15  [provider]  carvedilol (COREG) 6.25 MG tablet Take 1 tablet (6.25 mg total) by mouth 2 (two) times daily with a meal. 07/06/16   [name], MD  ferric citrate (AURYXIA) 1 GM 210 MG(Fe) tablet Take 420 mg by mouth 3 (three) times daily with meals.    [provider]  HYDROcodone-acetaminophen (NORCO/VICODIN) 5-325 MG tablet Take 1 tablet by mouth every 8 (eight) hours as needed (pain).  07/20/17   [provider]  insulin aspart (NOVOLOG FLEXPEN) 100 UNIT/ML FlexPen Inject 2-3 Units into the skin 3 (three) times daily with meals as needed for high blood sugar (CBG >150).    [provider]  Insulin Detemir (LEVEMIR FLEXTOUCH) 100 UNIT/ML Pen Inject 13 Units into the skin daily after breakfast.    [provider]  isosorbide mononitrate (IMDUR) 30 MG 24 hr tablet Take 1 tablet (30 mg total) by mouth daily. Patient taking differently: Take 30 mg by mouth See admin instructions. Take one tablet (30 mg) by mouth on Sunday, Tuesday, Thursday, Saturday mornings, take one tablet on Monday, Wednesday, Friday after dialysis 04/14/16   Rai, Ripudeep K, MD  polyethylene glycol (MIRALAX) packet Take 17 g by mouth daily. Patient taking differently: Take 17 g by mouth daily as needed (constipation).  04/05/16   Estill Dooms, MD  pravastatin (PRAVACHOL) 40 MG tablet Take 40 mg by mouth See admin instructions. Take one tablet (40 mg) by mouth on Sunday, Tuesday, Thursday, Saturday mornings,  take one tablet on Monday, Wednesday, Friday after dialysis    [provider]  pregabalin (LYRICA) 75 MG capsule Take 75 mg by mouth See admin instructions. Take one tablet (75 mg) by mouth on Sunday, Tuesday, Thursday, Saturday mornings, take one tablet on Monday, Wednesday, Friday after dialysis    [provider]  Promethazine-DM 6.25-15 MG/5ML SOLN Take 5 mLs by mouth 3 (three) times daily as needed (cough).  07/26/17   [provider]  QUEtiapine (SEROQUEL) 25 MG tablet Take 1 tablet (25 mg total) by mouth daily. Patient taking differently: Take 25 mg by mouth daily as needed (anxiety).  04/05/16   Estill Dooms, MD  warfarin (COUMADIN) 7.5 MG tablet Take 1 tablet (7.5 mg total) by mouth daily at 6 PM. 07/06/16   [name], MD    The patient is critically ill with multiple organ systems failure and requires high complexity decision making for assessment and support, frequent evaluation and titration of therapies, application of advanced monitoring technologies and extensive interpretation of multiple databases.   Critical Care Time devoted to patient care services described in this note is  40  Minutes. This time reflects time of care of this signee Dr Jennet Maduro. This critical care time does not reflect procedure time, or teaching time or supervisory time of PA/NP/Med student/Med Resident etc but could involve care discussion time.  Rush Farmer, M.D. [facility] Pulmonary/Critical Care Medicine. Pager: [phone]. After hours pager: [phone].

## 2017-12-11 NOTE — Consult Note (Signed)
Cardiology Consultation:   Patient ID: Aaron Long MRN: [id number]; DOB: 11/05/1940  Admit date: 01/06/2018 Date of Consult: 01/05/2018  Primary Care Provider: Bartholome Bill, MD Primary Cardiologist: [name], MD  Primary Electrophysiologist:  None    Patient Profile:   Aaron Long is a 77 y.o. male with a hx of coronary artery disease and end-stage renal disease status post bypass who is being seen today for the evaluation of PEA cardiac arrest at the request of Dr Gilford Raid.  History of Present Illness:   Aaron Long is a 77 year old male with end-stage renal disease on hemodialysis, bilateral BKA's with ischemic cardiomyopathy prior ejection fraction in 2018 was 35 to 40% with coronary artery disease status post four-vessel CABG (LIMA to LAD, SVG to diagonal, SVG to obtuse marginal, SVG to distal RCA) in 2007, cardiac catheterization 04/12/2016 showed chronic occlusion of all native coronaries, all 4 bypass grafts were patent, here after choking sitting up in a chair, losing consciousness, PEA arrest.  Epinephrine was administered by EMS, CPR, 8 minutes, has return to spontaneous circulation at that time.  Currently in the emergency department has some purposeful movements.  EKG shows fairly diffuse ST segment depression most notably in the lateral precordial leads with aVR elevation consistent with global ischemia.  Personally reviewed and interpreted.  He also has paroxysmal atrial fibrillation, was maintaining sinus rhythm at his last office visit with Dr. Angelena Form in July 2018.  He has been lost to follow-up.  INRs have been followed at family medicine at [facility].  Currently he is in [place] seen in the emergency department, heart rate in the 130 range, persistent, blood pressure in the 52W systolic.  Femoral access is being obtained by Marni Griffon of critical care.     Past Medical History:  Diagnosis Date  . Abnormality of gait   . Anemia    . BPH (benign prostatic hypertrophy)   . Chronic combined systolic and diastolic CHF (congestive heart failure) ([place])   . Complication of anesthesia    "he gets delirious"  . Constipation    takes Miralax daily  . Coronary artery disease    a. s/p NSTEMI/CABGx4 in 2007 - LIMA-LAD, SVG-optional diagonal, SVVG-OM, SVG-dRCA. b. Nuc 05/2011: nonischemic. c. all bypass grafts widely patent, 80% stenosis involving the grafted OM proximal to SVG anastomosis, which backfills the distal LCx, moderately elevated LVEDP, EF 25%.   . Diabetic peripheral neuropathy ([place])   . Dieulafoy lesion of rectum 05/03/2015  . Disorder of bone and cartilage, unspecified   . DVT (deep venous thrombosis) ([place])    a. Lower extremity DVT in 2013.  Aaron Long ESRD on dialysis [facility])    M-W-F  . Gangrene of toe (H[facility])    dry  . Hemorrhoids   . Hx of colonic polyps   . Hyperlipidemia   . Hypertension   . Hypertrophy of prostate without urinary obstruction and other lower urinary tract symptoms (LUTS)   . Ischemic cardiomyopathy    a. EF 40% in 2007. b. 51% by nuc 05/2011. c. EF 25% by cath and 35-40% by echo in 2018.  Aaron Long Kidney stones   . Lower limb amputation, below knee   . Myocardial infarction ([place]) 2005  . Neuromuscular disorder ([place])    diabetic neuropathy  . NSTEMI (non-ST elevated myocardial infarction) ([place]) 04/2016  . PAD (peripheral artery disease) ([place])    a. R CEA 2007. b. Hx L BKA in 2013. c. s/p LE angioplasty in 2014. d.  Hx R BKA in 2014.  Aaron Long PAF (paroxysmal atrial fibrillation) ([place])    a. dx 06/2016.  Aaron Long Peripheral neuropathy   . Prostate cancer ([place]) 2010  . Secondary hyperparathyroidism ([place])    Secondary Hyperpara- Thyroidism, Renal  . Type II diabetes mellitus ([place])     Past Surgical History:  Procedure Laterality Date  . ABDOMINAL AORTAGRAM N/A 05/28/2011   Procedure: ABDOMINAL Maxcine Ham;  Surgeon: Elam Dutch, MD;  Location: [facility] CATH LAB;  Service: Cardiovascular;  Laterality: N/A;  .  ABDOMINAL AORTAGRAM N/A 06/02/2012   Procedure: ABDOMINAL Maxcine Ham;  Surgeon: Elam Dutch, MD;  Location: [place] CATH LAB;  Service: Cardiovascular;  Laterality: N/A;  . ABDOMINAL AORTAGRAM N/A 06/09/2012   Procedure: ABDOMINAL Maxcine Ham;  Surgeon: Elam Dutch, MD;  Location: [facility] CATH LAB;  Service: Cardiovascular;  Laterality: N/A;  . AMPUTATION  06/14/2011   Procedure: AMPUTATION DIGIT;  Surgeon: Elam Dutch, MD;  Location: [facility] OR;  Service: Vascular;  Laterality: Left;  Amputation Left fifth toe  . AMPUTATION  06/16/2011   Procedure: AMPUTATION BELOW KNEE;  Surgeon: Elam Dutch, MD;  Location: [place];  Service: Vascular;  Laterality: Left;  . AMPUTATION Right 06/12/2012   Procedure: AMPUTATION DIGIT;  Surgeon: Elam Dutch, MD;  Location: [place];  Service: Vascular;  Laterality: Right;  GREAT TOE  . AMPUTATION Right 08/08/2012   Procedure: AMPUTATION BELOW KNEE;  Surgeon: Elam Dutch, MD;  Location: [place];  Service: Vascular;  Laterality: Right;  . AV FISTULA PLACEMENT Right 04/19/2014   Procedure: RIGHT ARM ARTERIOVENOUS (AV) FISTULA CREATION;  Surgeon: Mal Misty, MD;  Location: [place];  Service: Vascular;  Laterality: Right;  . AV FISTULA PLACEMENT Left 12/12/2014   Procedure: BRACHIOCEPHALIC ARTERIOVENOUS (AV) FISTULA CREATION;  Surgeon: Mal Misty, MD;  Location: [place];  Service: Vascular;  Laterality: Left;  . CARDIAC CATHETERIZATION  05/28/11  . CARDIAC CATHETERIZATION N/A 10/04/2014   Procedure: Left Heart Cath and Coronary Angiography;  Surgeon: Sherren Mocha, MD;  Location: [place] CV LAB;  Service: Cardiovascular;  Laterality: N/A;  . CAROTID ENDARTERECTOMY Right 2005  . CATARACT EXTRACTION W/ INTRAOCULAR LENS  IMPLANT, BILATERAL Bilateral 2011  . COLONOSCOPY    . COLONOSCOPY N/A 05/03/2015   Procedure: COLONOSCOPY;  Surgeon: Gatha Mayer, MD;  Location: [place];  Service: Endoscopy;  Laterality: N/A;  . CORONARY ARTERY BYPASS GRAFT  2005   CABG X4  .  ENDARTERECTOMY Left 10/17/2014   Procedure: ENDARTERECTOMY CAROTID;  Surgeon: Mal Misty, MD;  Location: [place];  Service: Vascular;  Laterality: Left;  . I&D EXTREMITY  01/31/2012   Procedure: IRRIGATION AND DEBRIDEMENT EXTREMITY;  Surgeon: Elam Dutch, MD;  Location: [place];  Service: Vascular;  Laterality: Left;  I & D Left BKA   . INSERTION OF DIALYSIS CATHETER Right 04/19/2014   Procedure: INSERTION OF DIALYSIS CATHETER-INTERNAL JUGULAR;  Surgeon: Mal Misty, MD;  Location: [place];  Service: Vascular;  Laterality: Right;  . INSERTION OF DIALYSIS CATHETER N/A 11/06/2014   Procedure: INSERTION OF 23cm DIALYSIS CATHETER - right internal jugular;  Surgeon: Mal Misty, MD;  Location: [place];  Service: Vascular;  Laterality: N/A;  . KNEE CARTILAGE SURGERY Left 1964  . LEFT HEART CATH AND CORONARY ANGIOGRAPHY N/A 04/12/2016   Procedure: Left Heart Cath and Coronary Angiography;  Surgeon: Nelva Bush, MD;  Location: [place] CV LAB;  Service: Cardiovascular;  Laterality: N/A;  . LIGATION OF ARTERIOVENOUS  FISTULA  Right 11/06/2014   Procedure: LIGATION OF RADIOCEPHALIC ARTERIOVENOUS FISTULA;  Surgeon: Mal Misty, MD;  Location: [place];  Service: Vascular;  Laterality: Right;  . LIGATION OF COMPETING BRANCHES OF ARTERIOVENOUS FISTULA Right 06/19/2014   Procedure: Right Arm LIGATION OF COMPETING BRANCHES OF RADIOCEPHALIC ARTERIOVENOUS FISTULA;  Surgeon: Mal Misty, MD;  Location: [place];  Service: Vascular;  Laterality: Right;  . PROSTATECTOMY  2009     Home Medications:  Prior to Admission medications   Medication Sig Start Date End Date Taking? Authorizing Provider  aspirin EC 81 MG tablet Take 81 mg by mouth See admin instructions. Take one tablet (81 mg) by mouth on Sunday, Tuesday, Thursday, Saturday mornings, take one tablet on Monday, Wednesday, Friday after dialysis    [provider]  calcium acetate (PHOSLO) 667 MG capsule Take 667 mg by mouth 3 (three) times  daily with meals.  08/27/15   [provider]  carvedilol (COREG) 6.25 MG tablet Take 1 tablet (6.25 mg total) by mouth 2 (two) times daily with a meal. 07/06/16   [name], MD  ferric citrate (AURYXIA) 1 GM 210 MG(Fe) tablet Take 420 mg by mouth 3 (three) times daily with meals.    [provider]  HYDROcodone-acetaminophen (NORCO/VICODIN) 5-325 MG tablet Take 1 tablet by mouth every 8 (eight) hours as needed (pain).  07/20/17   [provider]  insulin aspart (NOVOLOG FLEXPEN) 100 UNIT/ML FlexPen Inject 2-3 Units into the skin 3 (three) times daily with meals as needed for high blood sugar (CBG >150).    [provider]  Insulin Detemir (LEVEMIR FLEXTOUCH) 100 UNIT/ML Pen Inject 13 Units into the skin daily after breakfast.    [provider]  isosorbide mononitrate (IMDUR) 30 MG 24 hr tablet Take 1 tablet (30 mg total) by mouth daily. Patient taking differently: Take 30 mg by mouth See admin instructions. Take one tablet (30 mg) by mouth on Sunday, Tuesday, Thursday, Saturday mornings, take one tablet on Monday, Wednesday, Friday after dialysis 04/14/16   Rai, Ripudeep K, MD  polyethylene glycol (MIRALAX) packet Take 17 g by mouth daily. Patient taking differently: Take 17 g by mouth daily as needed (constipation).  04/05/16   Estill Dooms, MD  pravastatin (PRAVACHOL) 40 MG tablet Take 40 mg by mouth See admin instructions. Take one tablet (40 mg) by mouth on Sunday, Tuesday, Thursday, Saturday mornings, take one tablet on Monday, Wednesday, Friday after dialysis    [provider]  pregabalin (LYRICA) 75 MG capsule Take 75 mg by mouth See admin instructions. Take one tablet (75 mg) by mouth on Sunday, Tuesday, Thursday, Saturday mornings, take one tablet on Monday, Wednesday, Friday after dialysis    [provider]  Promethazine-DM 6.25-15 MG/5ML SOLN Take 5 mLs by mouth 3 (three) times daily as needed (cough).  07/26/17   [provider]  QUEtiapine (SEROQUEL) 25 MG tablet Take 1 tablet (25 mg total) by mouth daily. Patient taking differently: Take 25 mg by mouth daily as needed (anxiety).  04/05/16   Estill Dooms, MD  warfarin (COUMADIN) 7.5 MG tablet Take 1 tablet (7.5 mg total) by mouth daily at 6 PM. 07/06/16   [name], MD    Inpatient Medications: Scheduled Meds: . heparin  5,000 Units Subcutaneous Q8H  . insulin aspart  0-15 Units Subcutaneous Q4H   Continuous Infusions: . sodium chloride    . epinephrine    . famotidine (PEPCID) IV    . norepinephrine (  LEVOPHED) Adult infusion 20 mcg/min (12/18/2017 1545)  . propofol    .  sodium bicarbonate  infusion 1000 mL     PRN Meds: fentaNYL (SUBLIMAZE) injection, fentaNYL (SUBLIMAZE) injection  Allergies:    Allergies  Allergen Reactions  . Oxycodone Other (See Comments)    Hallucinations    Social History:   Social History   Socioeconomic History  . Marital status: Married    Spouse name: Not on file  . Number of children: 3  . Years of education: Not on file  . Highest education level: Not on file  Occupational History  . Occupation: Manufacturing systems engineer  Social Needs  . Financial resource strain: Not on file  . Food insecurity:    Worry: Not on file    Inability: Not on file  . Transportation needs:    Medical: Not on file    Non-medical: Not on file  Tobacco Use  . Smoking status: Former Smoker    Packs/day: 1.00    Years: 30.00    Pack years: 30.00    Types: Cigarettes    Last attempt to quit: 04/28/1991    Years since quitting: 26.6  . Smokeless tobacco: Never Used  Substance and Sexual Activity  . Alcohol use: Yes    Comment: 04/17/2014 "might have a glass of wine at Christmas"  . Drug use: No  . Sexual activity: Yes    Birth control/protection: None  Lifestyle  . Physical activity:    Days per week: Not on file    Minutes per session: Not on file  . Stress: Not on file  Relationships  . Social  connections:    Talks on phone: Not on file    Gets together: Not on file    Attends religious service: Not on file    Active member of club or organization: Not on file    Attends meetings of clubs or organizations: Not on file    Relationship status: Not on file  . Intimate partner violence:    Fear of current or ex partner: Not on file    Emotionally abused: Not on file    Physically abused: Not on file    Forced sexual activity: Not on file  Other Topics Concern  . Not on file  Social History Narrative  . Not on file    Family History:    Family History  Problem Relation Age of Onset  . Hyperlipidemia Mother   . Hypertension Mother   . Cancer Mother   . Hyperlipidemia Father   . Hypertension Father   . Kidney disease Father   . Heart disease Sister   . Alzheimer's disease Sister   . Cancer Brother   . Other Sister   . Coronary artery disease Neg Hx   . Anesthesia problems Neg Hx   . Hypotension Neg Hx   . Malignant hyperthermia Neg Hx   . Pseudochol deficiency Neg Hx      ROS:  Please see the history of present illness.  Unable   Physical Exam/Data:   Vitals:   12/15/2017 1600 12/22/2017 1601 12/23/2017 1602 01/07/2018 1605  BP: (!) 197/120 (!) 208/111 (!) 216/108 (!) 215/104  Pulse: (!) 134 (!) 141 (!) 124 (!) 114  Resp: 20 16 12 14   SpO2: (!) 77% 91% 95% 92%   No intake or output data in the 24 hours ending 12/30/2017 1611 There were no vitals filed for this visit. There is no height or weight on file  to calculate BMI.  General:  Well nourished, well developed, in no acute distress, chronically ill-appearing HEENT: normal Lymph: no adenopathy Neck: no JVD, positive right carotid endarterectomy scar Endocrine:  No thryomegaly Vascular: Right carotid bruit; Cardiac:  normal S1, S2; tachycardic regular; soft systolic ejection murmur right upper sternal border, CABG scar Lungs: Vent noise bilaterally Abd: soft, nontender, no hepatomegaly  Ext: no  edema Musculoskeletal: Bilateral BKA's noted Skin: cool and dry  Neuro:  sedate Psych:  sedate  EKG:  The EKG was personally reviewed and demonstrates: As described above with somewhat global ST segment depression, aVR elevation which is accentuated from his prior ECG on 08/09/2017.  Telemetry:  Telemetry was personally reviewed and demonstrates: Regular tachycardia, ST segment depression  Relevant CV Studies:  Echocardiogram 04/11/16 - Left ventricle: Septal and posterior lateral wall hypokinesis The   cavity size was mildly dilated. There was mild focal basal   hypertrophy of the septum. Systolic function was moderately   reduced. The estimated ejection fraction was in the range of 35%   to 40%. Doppler parameters are consistent with abnormal left   ventricular relaxation (grade 1 diastolic dysfunction). - Aortic valve: Likely trileaflet but not well seen - Atrial septum: No defect or patent foramen ovale was identified.  Cardiac catheterization 04/12/2016: Conclusions: 1. Severe native coronary artery disease, including chronic total occlusions of the proximal LAD, mid LAD, ostial LCx, and mid RCA. 2. All bypass grafts widely patent (LIMA->LAD, SVG->diagonal, SVG->OM, and SVG->PDA). There is 80% stenosis involving the grafted OM proximal to SVG anastomosis, which backfills the distal LCx. 3. Moderately elevated LVEDP (30 mmHg). 4. Severely reduced LV contraction (LVEF ~25%).  Conclusions: 1. Aggressive medical therapy, including additional fluid removal by hemodialysis. 2. Consider addition of beta blocker and/or long-acting nitrate (in addition to other evidence-based heart failure therapies).  Laboratory Data:  ChemistryNo results for input(s): NA, K, CL, CO2, GLUCOSE, BUN, CREATININE, CALCIUM, GFRNONAA, GFRAA, ANIONGAP in the last 168 hours.  No results for input(s): PROT, ALBUMIN, AST, ALT, ALKPHOS, BILITOT in the last 168 hours. Hematology Recent Labs  Lab 12/19/2017 1539   WBC 8.1  RBC 5.56  HGB 14.3  HCT 46.6  MCV 83.8  MCH 25.7*  MCHC 30.7  RDW 14.4  PLT 330   Cardiac EnzymesNo results for input(s): TROPONINI in the last 168 hours. No results for input(s): TROPIPOC in the last 168 hours.  BNPNo results for input(s): BNP, PROBNP in the last 168 hours.  DDimer No results for input(s): DDIMER in the last 168 hours.  Radiology/Studies:  No results found.  Assessment and Plan:   PEA (pulseless electrical activity) cardiac arrest - Aggressive supportive care, epinephrine administered, 8 minutes to return of spontaneous circulation. - ECG demonstrating accentuation of what appears to be global ischemia in the setting of his initial metabolic derangement. - We will recheck echocardiogram.  In 2018 his ejection fraction was approximately 40%.  Catheterization at that time was estimating 25%.  Acute respiratory failure - Ventilator management per critical care team.  Assisting with acid-base disorder as well.  Coronary artery disease status post CABG -Catheterization as described above in 2018 showed patent grafts.  Medical management.  Ischemic cardiomyopathy - EF has been ranging between 25 and 40%.  This did not sound like a ventricular arrhythmia.  When EMS arrived, pulseless electrical activity was noted.  No defibrillations.  Peripheral vascular disease with bilateral BKA's  - Increase his overall morbidity/mortality  End-stage renal disease on hemodialysis - Fluid management  per dialysis. -Question if acidosis can be assisted by CRRT.  He seems to be responding from a hemodynamic standpoint with bicarbonate infusions.  Paroxysmal atrial fibrillation -Chads vas score is 6, on warfarin, no prior stroke.  On telemetry currently could be 2-1 atrial flutter versus sinus tachycardia.  Challenging to discern.  Post epinephrine.  INR currently 0.99.   Critical care time 35 minutes spent with patient, discussion with critical care team, Marni Griffon, in this gentleman with multisystem organ failure status post PEA cardiac arrest.     For questions or updates, please contact [place] Please consult [email or web address] for contact info under     Signed, Candee Furbish, MD  12/18/2017 4:11 PM

## 2017-12-12 ENCOUNTER — Inpatient Hospital Stay (HOSPITAL_COMMUNITY): Payer: Medicare Other

## 2017-12-12 DIAGNOSIS — I469 Cardiac arrest, cause unspecified: Secondary | ICD-10-CM

## 2017-12-12 DIAGNOSIS — L8990 Pressure ulcer of unspecified site, unspecified stage: Secondary | ICD-10-CM | POA: Diagnosis present

## 2017-12-12 DIAGNOSIS — I248 Other forms of acute ischemic heart disease: Secondary | ICD-10-CM

## 2017-12-12 LAB — CBC
HCT: 28.2 % — ABNORMAL LOW (ref 39.0–52.0)
Hemoglobin: 9.1 g/dL — ABNORMAL LOW (ref 13.0–17.0)
MCH: 31.3 pg (ref 26.0–34.0)
MCHC: 32.3 g/dL (ref 30.0–36.0)
MCV: 96.9 fL (ref 80.0–100.0)
Platelets: 142 10*3/uL — ABNORMAL LOW (ref 150–400)
RBC: 2.91 MIL/uL — ABNORMAL LOW (ref 4.22–5.81)
RDW: 15.1 % (ref 11.5–15.5)
WBC: 10.5 10*3/uL (ref 4.0–10.5)

## 2017-12-12 LAB — PHOSPHORUS: PHOSPHORUS: 2.3 mg/dL — AB (ref 2.5–4.6)

## 2017-12-12 LAB — GLUCOSE, CAPILLARY
GLUCOSE-CAPILLARY: 225 mg/dL — AB (ref 70–99)
GLUCOSE-CAPILLARY: 69 mg/dL — AB (ref 70–99)
GLUCOSE-CAPILLARY: 86 mg/dL (ref 70–99)
GLUCOSE-CAPILLARY: 103 mg/dL — AB (ref 70–99)
Glucose-Capillary: 100 mg/dL — ABNORMAL HIGH (ref 70–99)
Glucose-Capillary: 109 mg/dL — ABNORMAL HIGH (ref 70–99)
Glucose-Capillary: 111 mg/dL — ABNORMAL HIGH (ref 70–99)
Glucose-Capillary: 113 mg/dL — ABNORMAL HIGH (ref 70–99)
Glucose-Capillary: 135 mg/dL — ABNORMAL HIGH (ref 70–99)
Glucose-Capillary: 179 mg/dL — ABNORMAL HIGH (ref 70–99)
Glucose-Capillary: 270 mg/dL — ABNORMAL HIGH (ref 70–99)
Glucose-Capillary: 340 mg/dL — ABNORMAL HIGH (ref 70–99)
Glucose-Capillary: 65 mg/dL — ABNORMAL LOW (ref 70–99)
Glucose-Capillary: 65 mg/dL — ABNORMAL LOW (ref 70–99)
Glucose-Capillary: 84 mg/dL (ref 70–99)

## 2017-12-12 LAB — ECHOCARDIOGRAM COMPLETE
Height: 68 in
Weight: 2776.03 [oz_av]

## 2017-12-12 LAB — BASIC METABOLIC PANEL
Anion gap: 17 — ABNORMAL HIGH (ref 5–15)
BUN: 57 mg/dL — ABNORMAL HIGH (ref 8–23)
CO2: 29 mmol/L (ref 22–32)
Calcium: 8.4 mg/dL — ABNORMAL LOW (ref 8.9–10.3)
Chloride: 94 mmol/L — ABNORMAL LOW (ref 98–111)
Creatinine, Ser: 9.11 mg/dL — ABNORMAL HIGH (ref 0.61–1.24)
GFR calc Af Amer: 6 mL/min — ABNORMAL LOW (ref >60)
GFR calc non Af Amer: 5 mL/min — ABNORMAL LOW (ref >60)
Glucose, Bld: 114 mg/dL — ABNORMAL HIGH (ref 70–99)
Potassium: 3 mmol/L — ABNORMAL LOW (ref 3.5–5.1)
Sodium: 140 mmol/L (ref 135–145)

## 2017-12-12 LAB — IRON AND TIBC
Iron: 28 ug/dL — ABNORMAL LOW (ref 45–182)
Saturation Ratios: 15 % — ABNORMAL LOW (ref 17.9–39.5)
TIBC: 192 ug/dL — ABNORMAL LOW (ref 250–450)
UIBC: 164 ug/dL

## 2017-12-12 LAB — POCT I-STAT 3, ART BLOOD GAS (G3+)
Acid-Base Excess: 5 mmol/L — ABNORMAL HIGH (ref 0.0–2.0)
Bicarbonate: 25.4 mmol/L (ref 20.0–28.0)
O2 SAT: 99 %
TCO2: 26 mmol/L (ref 22–32)
pCO2 arterial: 24.3 mmHg — ABNORMAL LOW (ref 32.0–48.0)
pH, Arterial: 7.627 (ref 7.350–7.450)
pO2, Arterial: 97 mmHg (ref 83.0–108.0)

## 2017-12-12 LAB — TROPONIN I
Troponin I: 1.8 ng/mL
Troponin I: 5.83 ng/mL (ref <0.03)

## 2017-12-12 LAB — FERRITIN: Ferritin: 6173 ng/mL — ABNORMAL HIGH (ref 24–336)

## 2017-12-12 LAB — MAGNESIUM: Magnesium: 1.8 mg/dL (ref 1.7–2.4)

## 2017-12-12 MED ORDER — POTASSIUM CHLORIDE 10 MEQ/50ML IV SOLN
10.0000 meq | INTRAVENOUS | Status: AC
  Administered 2017-12-12 (×2): 10 meq via INTRAVENOUS
  Filled 2017-12-12 (×2): qty 50

## 2017-12-12 MED ORDER — INSULIN DETEMIR 100 UNIT/ML ~~LOC~~ SOLN
5.0000 [IU] | Freq: Two times a day (BID) | SUBCUTANEOUS | Status: DC
  Administered 2017-12-12 – 2017-12-13 (×4): 5 [IU] via SUBCUTANEOUS
  Filled 2017-12-12 (×5): qty 0.05

## 2017-12-12 MED ORDER — INSULIN ASPART 100 UNIT/ML ~~LOC~~ SOLN
1.0000 [IU] | SUBCUTANEOUS | Status: DC
  Administered 2017-12-12 – 2017-12-13 (×5): 1 [IU] via SUBCUTANEOUS
  Administered 2017-12-13: 2 [IU] via SUBCUTANEOUS
  Administered 2017-12-14: 3 [IU] via SUBCUTANEOUS

## 2017-12-12 MED ORDER — DARBEPOETIN ALFA 60 MCG/0.3ML IJ SOSY
60.0000 ug | PREFILLED_SYRINGE | INTRAMUSCULAR | Status: DC
  Filled 2017-12-12: qty 0.3

## 2017-12-12 MED ORDER — DEXTROSE 50 % IV SOLN
INTRAVENOUS | Status: AC
  Administered 2017-12-12: 12 mL
  Administered 2017-12-12: 14 mL
  Filled 2017-12-12: qty 50

## 2017-12-12 MED ORDER — DARBEPOETIN ALFA 60 MCG/0.3ML IJ SOSY
60.0000 ug | PREFILLED_SYRINGE | INTRAMUSCULAR | Status: DC
  Administered 2017-12-14: 60 ug via INTRAVENOUS
  Filled 2017-12-12: qty 0.3

## 2017-12-12 MED ORDER — FAMOTIDINE IN NACL 20-0.9 MG/50ML-% IV SOLN
20.0000 mg | INTRAVENOUS | Status: DC
  Administered 2017-12-13 – 2017-12-15 (×3): 20 mg via INTRAVENOUS
  Filled 2017-12-12 (×3): qty 50

## 2017-12-12 MED FILL — Medication: Qty: 1 | Status: AC

## 2017-12-12 NOTE — Progress Notes (Signed)
RT NOTE:  Critical pH reported to [facility]

## 2017-12-12 NOTE — Progress Notes (Signed)
Patient ID: Aaron Long, male   DOB: [birth year], 77 y.o.   MRN: [id number]  [facility] Progress Note   Assessment/ Plan:   1.  Status post cardiac arrest: Suffered outside the hospital with 10 minutes of resuscitation required.  Now intubated/on ventilator and able to sustain blood pressures without pressors. 2. ESRD: Usually on a Monday/Wednesday/Friday schedule and will undergo hemodialysis today-we will rectify hypokalemia with change in dialysis prescription.  He appears to be euvolemic on physical exam. 3. Anemia: Downtrending hemoglobin noted, will check iron stores/restart ESA. 4. CKD-MBD: Currently n.p.o. while on ventilator, binders on hold. 5.  Pulmonary infiltrates: Differentials include mild interstitial pulmonary edema versus aspiration pneumonitis-monitor with hemodialysis. 6. Hypertension: Some iatrogenic hypotension noted after initial resuscitative effort requiring transient nicardipine drip-currently sustaining good blood pressures without supportive therapy.  Subjective:   Hypoglycemia events noted overnight with earlier labs today showing hypokalemia.   Objective:   BP 114/60   Pulse 93   Temp 98.8 F (37.1 C) (Oral)   Resp 16   Ht _0  (1.727 m)   Wt 78.7 kg   SpO2 100%   BMI 26.38 kg/m   Physical Exam: Gen: Appears to be comfortable resting in bed on ventilator. CVS: Pulse regular rhythm, normal rate, S1 and S2 normal Resp: Anteriorly clear to auscultation, no rales/rhonchi Abd: Soft, flat, nontender Ext: Status post bilateral below-knee amputations-interosseous line right stump.  Labs: BMET Recent Labs  Lab 01/04/2018 1539 01/05/2018 1553 12/18/2017 1558 12/12/17 0358  NA 138  --  131* 140  K 3.8  --  5.7* 3.0*  CL 102  --  99 94*  CO2 26  --   --  29  GLUCOSE 121*  --  282* 114*  BUN 5*  --  67* 57*  CREATININE 0.84  --  7.80* 9.11*  CALCIUM 9.1  --   --  8.4*  PHOS  --  8.7*  --  2.3*   CBC Recent Labs  Lab 12/12/2017 1539  12/31/2017 1558 12/12/17 0505  WBC 8.1  --  10.5  NEUTROABS 5.1  --   --   HGB 14.3 10.5* 9.1*  HCT 46.6 31.0* 28.2*  MCV 83.8  --  96.9  PLT 330  --  142*   Medications:    . chlorhexidine gluconate (MEDLINE KIT)  15 mL Mouth Rinse BID  . Chlorhexidine Gluconate Cloth  6 each Topical Q0600  . heparin  5,000 Units Subcutaneous Q8H  . mouth rinse  15 mL Mouth Rinse 10 times per day  . sodium chloride flush  10-40 mL Intracatheter Q12H   Elmarie Shiley, MD 12/12/2017, 7:37 AM

## 2017-12-12 NOTE — Progress Notes (Signed)
NAME:  Aaron Long, MRN:  [id number], DOB:  02-03-1940, LOS: 1 ADMISSION DATE:  01/09/2018, CONSULTATION DATE:  01/06/2018 REFERRING MD:  [facility] ED, CHIEF COMPLAINT:  Cardiac arrest.   HPI/course in hospital  60 man who is post PEA cardiac arrest due to choking episode. ROSC after 25min. No TTM due to hypotension.   Past Medical History  He,  has a past medical history of Abnormality of gait, Anemia, BPH (benign prostatic hypertrophy), Chronic combined systolic and diastolic CHF (congestive heart failure) ([place]), Complication of anesthesia, Constipation, Coronary artery disease, Diabetic peripheral neuropathy ([place]), Dieulafoy lesion of rectum (05/03/2015), Disorder of bone and cartilage, unspecified, DVT (deep venous thrombosis) ([place]), ESRD on dialysis ([place]), Gangrene of toe ([place]), Hemorrhoids, colonic polyps, Hyperlipidemia, Hypertension, Hypertrophy of prostate without urinary obstruction and other lower urinary tract symptoms (LUTS), Ischemic cardiomyopathy, Kidney stones, Lower limb amputation, below knee, Myocardial infarction ([place]) (2005), Neuromuscular disorder ([place]), NSTEMI (non-ST elevated myocardial infarction) ([place]) (04/2016), PAD (peripheral artery disease) ([place]), PAF (paroxysmal atrial fibrillation) ([place]), Peripheral neuropathy, Prostate cancer ([place]) (2010), Secondary hyperparathyroidism ([place]), and Type II diabetes mellitus ([place]).  Past Surgical History:  Procedure Laterality Date  . ABDOMINAL AORTAGRAM N/A 05/28/2011   Procedure: ABDOMINAL Maxcine Ham;  Surgeon: Elam Dutch, MD;  Location: [facility] CATH LAB;  Service: Cardiovascular;  Laterality: N/A;  . ABDOMINAL AORTAGRAM N/A 06/02/2012   Procedure: ABDOMINAL Maxcine Ham;  Surgeon: Elam Dutch, MD;  Location: [facility] CATH LAB;  Service: Cardiovascular;  Laterality: N/A;  . ABDOMINAL AORTAGRAM N/A 06/09/2012   Procedure: ABDOMINAL Maxcine Ham;  Surgeon: Elam Dutch, MD;  Location: [facility] CATH LAB;  Service: Cardiovascular;   Laterality: N/A;  . AMPUTATION  06/14/2011   Procedure: AMPUTATION DIGIT;  Surgeon: Elam Dutch, MD;  Location: [facility] OR;  Service: Vascular;  Laterality: Left;  Amputation Left fifth toe  . AMPUTATION  06/16/2011   Procedure: AMPUTATION BELOW KNEE;  Surgeon: Elam Dutch, MD;  Location: [place];  Service: Vascular;  Laterality: Left;  . AMPUTATION Right 06/12/2012   Procedure: AMPUTATION DIGIT;  Surgeon: Elam Dutch, MD;  Location: [place];  Service: Vascular;  Laterality: Right;  GREAT TOE  . AMPUTATION Right 08/08/2012   Procedure: AMPUTATION BELOW KNEE;  Surgeon: Elam Dutch, MD;  Location: [place];  Service: Vascular;  Laterality: Right;  . AV FISTULA PLACEMENT Right 04/19/2014   Procedure: RIGHT ARM ARTERIOVENOUS (AV) FISTULA CREATION;  Surgeon: Mal Misty, MD;  Location: [place];  Service: Vascular;  Laterality: Right;  . AV FISTULA PLACEMENT Left 12/12/2014   Procedure: BRACHIOCEPHALIC ARTERIOVENOUS (AV) FISTULA CREATION;  Surgeon: Mal Misty, MD;  Location: [place];  Service: Vascular;  Laterality: Left;  . CARDIAC CATHETERIZATION  05/28/11  . CARDIAC CATHETERIZATION N/A 10/04/2014   Procedure: Left Heart Cath and Coronary Angiography;  Surgeon: Sherren Mocha, MD;  Location: [place] CV LAB;  Service: Cardiovascular;  Laterality: N/A;  . CAROTID ENDARTERECTOMY Right 2005  . CATARACT EXTRACTION W/ INTRAOCULAR LENS  IMPLANT, BILATERAL Bilateral 2011  . COLONOSCOPY    . COLONOSCOPY N/A 05/03/2015   Procedure: COLONOSCOPY;  Surgeon: Gatha Mayer, MD;  Location: [place];  Service: Endoscopy;  Laterality: N/A;  . CORONARY ARTERY BYPASS GRAFT  2005   CABG X4  . ENDARTERECTOMY Left 10/17/2014   Procedure: ENDARTERECTOMY CAROTID;  Surgeon: Mal Misty, MD;  Location: [place];  Service: Vascular;  Laterality: Left;  . I&D EXTREMITY  01/31/2012   Procedure: IRRIGATION AND DEBRIDEMENT EXTREMITY;  Surgeon: Elam Dutch, MD;  Location: [facility] OR;  Service: Vascular;  Laterality: Left;   I & D Left BKA   . INSERTION OF DIALYSIS CATHETER Right 04/19/2014   Procedure: INSERTION OF DIALYSIS CATHETER-INTERNAL JUGULAR;  Surgeon: Mal Misty, MD;  Location: [place];  Service: Vascular;  Laterality: Right;  . INSERTION OF DIALYSIS CATHETER N/A 11/06/2014   Procedure: INSERTION OF 23cm DIALYSIS CATHETER - right internal jugular;  Surgeon: Mal Misty, MD;  Location: [place];  Service: Vascular;  Laterality: N/A;  . KNEE CARTILAGE SURGERY Left 1964  . LEFT HEART CATH AND CORONARY ANGIOGRAPHY N/A 04/12/2016   Procedure: Left Heart Cath and Coronary Angiography;  Surgeon: Nelva Bush, MD;  Location: [place] CV LAB;  Service: Cardiovascular;  Laterality: N/A;  . LIGATION OF ARTERIOVENOUS  FISTULA Right 11/06/2014   Procedure: LIGATION OF RADIOCEPHALIC ARTERIOVENOUS FISTULA;  Surgeon: Mal Misty, MD;  Location: [place];  Service: Vascular;  Laterality: Right;  . LIGATION OF COMPETING BRANCHES OF ARTERIOVENOUS FISTULA Right 06/19/2014   Procedure: Right Arm LIGATION OF COMPETING BRANCHES OF RADIOCEPHALIC ARTERIOVENOUS FISTULA;  Surgeon: Mal Misty, MD;  Location: [place];  Service: Vascular;  Laterality: Right;  . PROSTATECTOMY  2009      Interim history/subjective:  For HD today. Cardiology not recommending cath.  Initially hypotensive then hypertensive thereafter.  Started on insulin infusion   Objective   Blood pressure 106/64, pulse 95, temperature 100.2 F (37.9 C), temperature source Axillary, resp. rate 16, height 5\' 8"  (1.727 m), weight 78.7 kg, SpO2 100 %.    Vent Mode: PRVC FiO2 (%):  [40 %-60 %] 40 % Set Rate:  [14 bmp-24 bmp] 16 bmp Vt Set:  [550 mL] 550 mL PEEP:  [5 cmH20] 5 cmH20 Plateau Pressure:  [23 cmH20-27 cmH20] 24 cmH20   Intake/Output Summary (Last 24 hours) at 12/12/2017 0907 Last data filed at 12/12/2017 0800 Gross per 24 hour  Intake 1614.41 ml  Output 50 ml  Net 1564.41 ml   Filed Weights   12/20/2017 2000 12/12/17 0500  Weight: 78.1 kg 78.7 kg     Examination: Physical Exam  Constitutional: He appears well-developed and well-nourished. No distress. He is intubated.  On no sedation.  HENT:  Head: Atraumatic.  Eyes: Right eye exhibits abnormal extraocular motion. Left eye exhibits abnormal extraocular motion. Pupils are unequal.  Bilateral downward and rightward gaze. Doesn't cross midline  Neck: No JVD present. No tracheal deviation present.  Cardiovascular: Normal rate, regular rhythm, S1 normal, S2 normal and intact distal pulses. PMI is displaced (LV heave.). Exam reveals gallop, S3 and S4.  Respiratory: Breath sounds normal. No accessory muscle usage. He is intubated. No respiratory distress.  Spontaneous hypoventilation on PSV  GI: Soft. Normal appearance. There is no tenderness.  Musculoskeletal:  Bilateral below knee amputation.  Left upper arm fistula with thrill and bruit.  Neurological: He is unresponsive. GCS eye subscore is 1. GCS verbal subscore is 1. GCS motor subscore is 1.  No response to pain. Cough and gag present.     Ancillary tests (personally reviewed)  CBC: Recent Labs  Lab 12/28/2017 1539 01/05/2018 1558 12/12/17 0505  WBC 8.1  --  10.5  NEUTROABS 5.1  --   --   HGB 14.3 10.5* 9.1*  HCT 46.6 31.0* 28.2*  MCV 83.8  --  96.9  PLT 330  --  142*    Basic Metabolic Panel: Recent Labs  Lab 12/30/2017 1539 12/31/2017 1553 12/29/2017 1558 12/12/17  0358  NA 138  --  131* 140  K 3.8  --  5.7* 3.0*  CL 102  --  99 94*  CO2 26  --   --  29  GLUCOSE 121*  --  282* 114*  BUN 5*  --  67* 57*  CREATININE 0.84  --  7.80* 9.11*  CALCIUM 9.1  --   --  8.4*  MG  --  2.2  --  1.8  PHOS  --  8.7*  --  2.3*   GFR: Estimated Creatinine Clearance: 6.6 mL/min (A) (by C-G formula based on SCr of 9.11 mg/dL (H)). Recent Labs  Lab 12/25/2017 1539 12/31/2017 1553 12/13/2017 1600 12/12/17 0505  PROCALCITON <0.10 0.10  --   --   WBC 8.1  --   --  10.5  LATICACIDVEN  --   --  5.36*  --     Liver Function  Tests: Recent Labs  Lab 12/12/2017 1539  AST 20  ALT 19  ALKPHOS 75  BILITOT 0.2*  PROT 8.1  ALBUMIN 3.6   Recent Labs  Lab 12/20/2017 1553  LIPASE 140*  AMYLASE 134*   No results for input(s): AMMONIA in the last 168 hours.  ABG    Component Value Date/Time   PHART 7.627 (HH) 12/12/2017 0336   PCO2ART 24.3 (L) 12/12/2017 0336   PO2ART 97.0 12/12/2017 0336   HCO3 25.4 12/12/2017 0336   TCO2 26 12/12/2017 0336   ACIDBASEDEF 8.0 (H) 01/10/2018 1951   O2SAT 99.0 12/12/2017 0336     Coagulation Profile: Recent Labs  Lab 01/01/2018 1539  INR 0.99    Cardiac Enzymes: Recent Labs  Lab 12/22/2017 1539 01/09/2018 2201 12/12/17 0358  TROPONINI <0.03 1.80* 5.83*    HbA1C: Hgb A1c MFr Bld  Date/Time Value Ref Range Status  01/06/2018 03:53 PM 7.6 (H) 4.8 - 5.6 % Final    Comment:    (NOTE) Pre diabetes:          5.7%-6.4% Diabetes:              >6.4% Glycemic control for   <7.0% adults with diabetes   08/09/2017 05:32 AM 7.7 (H) 4.8 - 5.6 % Final    Comment:    (NOTE) Pre diabetes:          5.7%-6.4% Diabetes:              >6.4% Glycemic control for   <7.0% adults with diabetes     CBG: Recent Labs  Lab 12/12/17 0527 12/12/17 0647 12/12/17 0648 12/12/17 0709 12/12/17 0813  GLUCAP 84 65* 65* 86 100*   CXR 12/2: increased interstitial markings and basilar airspace disease on the right side consistent with aspiration.   CT head 12/1: atrophy and microvascular changes - nil acute.  Assessment & Plan:  Critically ill due to encephalopathy following cardiac arrest requiring mechanical ventilation for respiratory failure due to inability to protect airway. Hypoxic Ischemic Encephalopathy (HIE) marked unresponsiveness is concerning after short cardiac arrest. ESRD on HD. Demand related ischemia  Hyperventilation on mechanical ventilation. Advanced cardiomyopathy with low EF.  PLAN  EEG to rule out non-convulsive status HD to clear any potential residual  sedative, correct uremia.  Prognosis is poor - age and vascular disease further negative prognostic factors. Limited baseline life expectancy with ESRD and CHF. Further goals of care discussion with family: we have agreed to a 48h monitoring period to rule out seizures and dialyze him and reassess exam at that time.  Best practice:  Diet: Hold until after goals of care. Pain/Anxiety/Delirium protocol (if indicated): no sedatives VAP protocol (if indicated):  bundle DVT prophylaxis: heparin [place] GI prophylaxis: famotidine Glucose control: SSI Mobility: bedrest Code Status: no CPR Family Communication: will discuss prognosis again with family today. Disposition: ICU   Critical care time: 74min      Advay Volante, MD [facility] ICU Physician [place]  Pager: [phone] Mobile: [phone] After hours: [phone].

## 2017-12-12 NOTE — Progress Notes (Signed)
Initial Nutrition Assessment  DOCUMENTATION CODES:   Not applicable  INTERVENTION:   If enteral nutrition is within [place], recommend: - Vital AF 1.2 @ 55 ml/hr (1320 ml/day)  Tube feeding regimen provides 1584 kcal, 99 grams of protein, and 1069 ml of free H2O (99% of needs).  NUTRITION DIAGNOSIS:   Inadequate oral intake related to inability to eat as evidenced by NPO status.  GOAL:   Patient will meet greater than or equal to 90% of their needs  MONITOR:   Vent status, I & O's, Labs, Skin, Weight trends, Other (GOC)  REASON FOR ASSESSMENT:   Ventilator    ASSESSMENT:   77 year old male who presented to the ED on 12/1 post-CPR from home. PMH significant for ESRD on HD, PVD with bilateral BKA, hypertension, prostate cancer, hyperlipidemia, T2DM, and CAD s/p CABG x 4. Pt admitted with PEA arrest and was intubated in the field.  Per critical care MD note this morning, prognosis is poor and plan is for further [place] discussion with family who "have agreed to a 48-hour monitoring period to rule out seizures and dialyze him and reassess exam at that time." RD will leave tube feeding recommendations.  Patient is currently intubated on ventilator support. OG tube to low-intermittent suction. MV: 7.1 L/min Temp (24hrs), Avg:98.9 F (37.2 C), Min:98.3 F (36.8 C), Max:100.2 F (37.9 C) BP: 116/71 MAP: 83  Spoke with family at bedside. Pt's wife tearful during RD visit. Pt's wife reports that pt had a good appetite and ate several meals daily PTA. Pt's wife states that she thinks pt has been losing weight but is unsure of UBW or amount of weight loss.  Weight history in chart demonstrates weight gain over over the past 4 months. Unsure whether this is true weight gain vs fluid as pt's admission weight of 78.1 is well above his EDW of 67.5. Will use EDW when calculating estimated needs.  Drips: NS at 10 ml/hr  Medications reviewed and include: SSI 0-3 units q 4 hours, Levemir 5  units q 12 hours, IV Pepcid, IV KCl 10 mEq x 2 runs  Labs reviewed: potassium 3.0 (L) - being replaced, phosphorus 2.3 (L), magnesium WNL, hemoglobin 9.1 (L) CBG's: 100, 86, 65, 65, 84, 69, 109, 179, 225, 270, 340 x 12 hours (trending down, insulin drip stopped this AM)  NG output: 50 ml x 24 hours I/O's: +1.5 L since admit  NUTRITION - FOCUSED PHYSICAL EXAM:    Most Recent Value  Orbital Region  Mild depletion  Upper Arm Region  No depletion  Thoracic and Lumbar Region  No depletion  Buccal Region  Unable to assess  Temple Region  No depletion  Clavicle Bone Region  No depletion  Clavicle and Acromion Bone Region  Mild depletion  Scapular Bone Region  Unable to assess  Dorsal Hand  No depletion  Patellar Region  No depletion  Anterior Thigh Region  Mild depletion  Posterior Calf Region  Unable to assess [bilateral BKA]  Edema (RD Assessment)  Mild [generalized]  Hair  Reviewed  Eyes  Unable to assess  Mouth  Unable to assess  Skin  Reviewed  Nails  Reviewed       Diet Order:   Diet Order    None      EDUCATION NEEDS:   Not appropriate for education at this time  Skin:  Skin Assessment: Skin Integrity Issues: Stage II: sacrum  Last BM:  12/2 (large type 6)  Height:   Ht Readings  from Last 1 Encounters:  12/12/17 5\' 8"  (1.727 m)   Of note, the only height available in pt's chart is 1.727 m. Unsure whether this is pre- or post-amputation height.  Weight:   Wt Readings from Last 1 Encounters:  12/12/17 78.7 kg    Ideal Body Weight:  60.9 kg (adjust for bilateral BKA)  BMI:  Body mass index is 26.38 kg/m.  Estimated Nutritional Needs:   Kcal:  3888  Protein:  90-105 grams  Fluid:  UOP + 1000 ml or per MD    Gaynell Face, MS, RD, LDN Inpatient Clinical Dietitian Pager: [phone] Weekend/After Hours: [phone]

## 2017-12-12 NOTE — Progress Notes (Signed)
CRITICAL VALUE ALERT  Critical Value:  Troponin 1.80  Date & Time Notied:  12/12/17 @ 12:16 AM  Provider Notified: eLink notified  Orders Received/Actions taken: No new orders received at this time.

## 2017-12-12 NOTE — Progress Notes (Signed)
DAILY PROGRESS NOTE   Patient Name: Aaron Long Date of Encounter: 12/12/2017  Chief Complaint   Intubated, sedated on vent  Patient Profile   77 yo male with a hx of coronary artery disease and end-stage renal disease status post bypass presented with PEA arrest. Last cath showed all 4 grafts patent in 04/2016, LVEF between 25-40%.  Subjective   No events overnight. Plans for hemodialysis today. Not on pressors. Troponin increased from 1.8 up to 5.83. Echo pending. Brief NSVT overnight.  Objective   Vitals:   12/12/17 0630 12/12/17 0700 12/12/17 0759 12/12/17 0800  BP: (!) 122/58 114/60 106/64 106/64  Pulse: 97 93 (!) 105 95  Resp: 16 16 16 16   Temp:    100.2 F (37.9 C)  TempSrc:    Axillary  SpO2: 99% 100% 100% 100%  Weight:      Height:        Intake/Output Summary (Last 24 hours) at 12/12/2017 0848 Last data filed at 12/12/2017 0800 Gross per 24 hour  Intake 1614.41 ml  Output 50 ml  Net 1564.41 ml   Filed Weights   12/17/2017 2000 12/12/17 0500  Weight: 78.1 kg 78.7 kg    Physical Exam   General appearance: intubated on vent, not on sedation Neck: no carotid bruit, no JVD and thyroid not enlarged, symmetric, no tenderness/mass/nodules Lungs: clear to auscultation bilaterally Heart: regular rate and rhythm Abdomen: soft, non-tender; bowel sounds normal; no masses,  no organomegaly Extremities: bilateral BKA Pulses: adequate UE pulses Skin: Skin color, texture, turgor normal. No rashes or lesions Neurologic: Mental status: does not awaken with stimulation or follow commands, no sedation noted Psych: Cannot assess  Inpatient Medications    Scheduled Meds: . chlorhexidine gluconate (MEDLINE KIT)  15 mL Mouth Rinse BID  . Chlorhexidine Gluconate Cloth  6 each Topical Q0600  . darbepoetin (ARANESP) injection - DIALYSIS  60 mcg Intravenous Q Mon-HD  . heparin  5,000 Units Subcutaneous Q8H  . insulin aspart  1-3 Units Subcutaneous Q4H  . insulin  detemir  5 Units Subcutaneous Q12H  . mouth rinse  15 mL Mouth Rinse 10 times per day  . sodium chloride flush  10-40 mL Intracatheter Q12H    Continuous Infusions: . epinephrine    . famotidine (PEPCID) IV Stopped (01/02/2018 2244)  . insulin Stopped (12/12/17 0506)  . niCARDipine Stopped (12/12/17 0734)  . norepinephrine (LEVOPHED) Adult infusion Stopped (01/01/2018 1713)  . potassium chloride 50 mL/hr at 12/12/17 0800    PRN Meds: fentaNYL (SUBLIMAZE) injection, fentaNYL (SUBLIMAZE) injection, sodium chloride flush   Labs   Results for orders placed or performed during the hospital encounter of 12/19/2017 (from the past 48 hour(s))  Comprehensive metabolic panel     Status: Abnormal   Collection Time: 12/30/2017  3:39 PM  Result Value Ref Range   Sodium 138 135 - 145 mmol/L   Potassium 3.8 3.5 - 5.1 mmol/L   Chloride 102 98 - 111 mmol/L   CO2 26 22 - 32 mmol/L   Glucose, Bld 121 (H) 70 - 99 mg/dL   BUN 5 (L) 8 - 23 mg/dL   Creatinine, Ser 0.84 0.61 - 1.24 mg/dL   Calcium 9.1 8.9 - 10.3 mg/dL   Total Protein 8.1 6.5 - 8.1 g/dL   Albumin 3.6 3.5 - 5.0 g/dL   AST 20 15 - 41 U/L   ALT 19 0 - 44 U/L   Alkaline Phosphatase 75 38 - 126 U/L   Total Bilirubin  0.2 (L) 0.3 - 1.2 mg/dL   GFR calc non Af Amer >60 >60 mL/min   GFR calc Af Amer >60 >60 mL/min   Anion gap 10 5 - 15    Comment: Performed at [facility] [address], [place], [place] [address]  Troponin I - Once     Status: None   Collection Time: 12/18/2017  3:39 PM  Result Value Ref Range   Troponin I <0.03 <0.03 ng/mL    Comment: Performed at [facility] [address], [place], [place] [address]  CBC with Differential     Status: Abnormal   Collection Time: 12/25/2017  3:39 PM  Result Value Ref Range   WBC 8.1 4.0 - 10.5 K/uL   RBC 5.56 4.22 - 5.81 MIL/uL   Hemoglobin 14.3 13.0 - 17.0 g/dL   HCT 46.6 39.0 - 52.0 %   MCV 83.8 80.0 - 100.0 fL   MCH 25.7 (L) 26.0 - 34.0 pg   MCHC 30.7 30.0 - 36.0 g/dL     RDW 14.4 11.5 - 15.5 %   Platelets 330 150 - 400 K/uL   nRBC 0.0 0.0 - 0.2 %   Neutrophils Relative % 63 %   Neutro Abs 5.1 1.7 - 7.7 K/uL   Lymphocytes Relative 27 %   Lymphs Abs 2.2 0.7 - 4.0 K/uL   Monocytes Relative 6 %   Monocytes Absolute 0.5 0.1 - 1.0 K/uL   Eosinophils Relative 4 %   Eosinophils Absolute 0.3 0.0 - 0.5 K/uL   Basophils Relative 0 %   Basophils Absolute 0.0 0.0 - 0.1 K/uL   Immature Granulocytes 0 %   Abs Immature Granulocytes 0.02 0.00 - 0.07 K/uL    Comment: Performed at [facility] [address], [place], [place] [address]  Protime-INR     Status: None   Collection Time: 12/14/2017  3:39 PM  Result Value Ref Range   Prothrombin Time 13.0 11.4 - 15.2 seconds   INR 0.99     Comment: Performed at [facility] [address] [place], [place] [address]  Triglycerides     Status: None   Collection Time: 12/24/2017  3:39 PM  Result Value Ref Range   Triglycerides 73 <150 mg/dL    Comment: Performed at [facility] [address], [place], [place] [address]  Procalcitonin     Status: None   Collection Time: 12/13/2017  3:39 PM  Result Value Ref Range   Procalcitonin <0.10 ng/mL    Comment:        Interpretation: PCT (Procalcitonin) <= 0.5 ng/mL: Systemic infection (sepsis) is not likely. Local bacterial infection is possible. (NOTE)       Sepsis PCT Algorithm           Lower Respiratory Tract                                      Infection PCT Algorithm    ----------------------------     ----------------------------         PCT < 0.25 ng/mL                PCT < 0.10 ng/mL         Strongly encourage             Strongly discourage   discontinuation of antibiotics    initiation of  antibiotics    ----------------------------     -----------------------------       PCT 0.25 - 0.50 ng/mL            PCT 0.10 - 0.25 ng/mL               OR       >80% decrease in PCT            Discourage initiation of                                             antibiotics      Encourage discontinuation           of antibiotics    ----------------------------     -----------------------------         PCT >= 0.50 ng/mL              PCT 0.26 - 0.50 ng/mL               AND        <80% decrease in PCT             Encourage initiation of                                             antibiotics       Encourage continuation           of antibiotics    ----------------------------     -----------------------------        PCT >= 0.50 ng/mL                  PCT > 0.50 ng/mL               AND         increase in PCT                  Strongly encourage                                      initiation of antibiotics    Strongly encourage escalation           of antibiotics                                     -----------------------------                                           PCT <= 0.25 ng/mL                                                 OR                                        >  80% decrease in PCT                                     Discontinue / Do not initiate                                             antibiotics Performed at [facility], [place] [address] [place], [place] [address]   Hemoglobin A1c     Status: Abnormal   Collection Time: 12/20/2017  3:53 PM  Result Value Ref Range   Hgb A1c MFr Bld 7.6 (H) 4.8 - 5.6 %    Comment: (NOTE) Pre diabetes:          5.7%-6.4% Diabetes:              >6.4% Glycemic control for   <7.0% adults with diabetes    Mean Plasma Glucose 171.42 mg/dL    Comment: Performed at [facility] [address], [place], [place] [address]  Magnesium     Status: None   Collection Time: 12/20/2017  3:53 PM  Result Value Ref Range   Magnesium 2.2 1.7 - 2.4 mg/dL    Comment: Performed at [facility], [place] [address] [place], [place] [address]  Phosphorus     Status: Abnormal   Collection Time: 01/02/2018  3:53 PM  Result Value Ref Range   Phosphorus 8.7 (H) 2.5 - 4.6 mg/dL    Comment:  Performed at [facility] [address] [place], [place] [address]  Amylase     Status: Abnormal   Collection Time: 12/22/2017  3:53 PM  Result Value Ref Range   Amylase 134 (H) 28 - 100 U/L    Comment: Performed at [facility], [place] [address] [place], [place] [address]  Lipase, blood     Status: Abnormal   Collection Time: 12/29/2017  3:53 PM  Result Value Ref Range   Lipase 140 (H) 11 - 51 U/L    Comment: Performed at [facility] [address] [place], [place] [address]  Procalcitonin     Status: None   Collection Time: 12/29/2017  3:53 PM  Result Value Ref Range   Procalcitonin 0.10 ng/mL    Comment:        Interpretation: PCT (Procalcitonin) <= 0.5 ng/mL: Systemic infection (sepsis) is not likely. Local bacterial infection is possible. QUESTIONABLE RESULTS, RECOMMEND RECOLLECT TO VERIFY HEMOLYSIS AT THIS LEVEL MAY AFFECT RESULT NOTIFIED S.ADAMS,RN @ 1820 01/08/2018 WEBBERJ (NOTE)       Sepsis PCT Algorithm           Lower Respiratory Tract                                      Infection PCT Algorithm    ----------------------------     ----------------------------         PCT < 0.25 ng/mL                PCT < 0.10 ng/mL         Strongly encourage             Strongly discourage   discontinuation of antibiotics  initiation of antibiotics    ----------------------------     -----------------------------       PCT 0.25 - 0.50 ng/mL            PCT 0.10 - 0.25 ng/mL               OR       >80% decrease in PCT            Discourage initiation of                                            antibiotics      Encourage discontinuation           of antibiotics    ------- ---------------------     -----------------------------         PCT >= 0.50 ng/mL              PCT 0.26 - 0.50 ng/mL               AND       <80% decrease in PCT             Encourage initiation of                                             antibiotics       Encourage continuation            of antibiotics    ----------------------------     -----------------------------        PCT >= 0.50 ng/mL                  PCT > 0.50 ng/mL               AND         increase in PCT                  Strongly encourage                                      initiation of antibiotics    Strongly encourage escalation           of antibiotics                                     -----------------------------                                           PCT <= 0.25 ng/mL                                                 OR                                        >  80% decrease in PCT                                     Discontinue / Do not initiate                                             a ntibiotics Performed at [facility], [place] [address] [place], [place] [address]   Brain natriuretic peptide     Status: Abnormal   Collection Time: 01/08/2018  3:53 PM  Result Value Ref Range   B Natriuretic Peptide 3,648.6 (H) 0.0 - 100.0 pg/mL    Comment: Performed at [facility] [address], [place], [place] [address]  I-stat troponin, ED     Status: Abnormal   Collection Time: 12/21/2017  3:57 PM  Result Value Ref Range   Troponin i, poc 0.84 (HH) 0.00 - 0.08 ng/mL   Comment NOTIFIED PHYSICIAN    Comment 3            Comment: Due to the release kinetics of cTnI, a negative result within the first hours of the onset of symptoms does not rule out myocardial infarction with certainty. If myocardial infarction is still suspected, repeat the test at appropriate intervals.   I-stat Chem 8, ED     Status: Abnormal   Collection Time: 01/09/2018  3:58 PM  Result Value Ref Range   Sodium 131 (L) 135 - 145 mmol/L   Potassium 5.7 (H) 3.5 - 5.1 mmol/L   Chloride 99 98 - 111 mmol/L   BUN 67 (H) 8 - 23 mg/dL   Creatinine, Ser 7.80 (H) 0.61 - 1.24 mg/dL   Glucose, Bld 282 (H) 70 - 99 mg/dL   Calcium, Ion 0.92 (L) 1.15 - 1.40 mmol/L   TCO2 24 22 - 32 mmol/L   Hemoglobin 10.5 (L) 13.0 - 17.0 g/dL    HCT 31.0 (L) 39.0 - 52.0 %  I-Stat CG4 Lactic Acid, ED     Status: Abnormal   Collection Time: 12/15/2017  4:00 PM  Result Value Ref Range   Lactic Acid, Venous 5.36 (HH) 0.5 - 1.9 mmol/L   Comment NOTIFIED PHYSICIAN   CBG monitoring, ED     Status: Abnormal   Collection Time: 12/15/2017  4:04 PM  Result Value Ref Range   Glucose-Capillary 315 (H) 70 - 99 mg/dL  I-Stat arterial blood gas, ED     Status: Abnormal   Collection Time: 01/01/2018  4:06 PM  Result Value Ref Range   pH, Arterial 7.091 (LL) 7.350 - 7.450   pCO2 arterial 27.8 (L) 32.0 - 48.0 mmHg   pO2, Arterial 21.0 (LL) 83.0 - 108.0 mmHg   Bicarbonate 8.4 (L) 20.0 - 28.0 mmol/L   TCO2 9 (L) 22 - 32 mmol/L   O2 Saturation 21.0 %   Acid-base deficit 19.0 (H) 0.0 - 2.0 mmol/L   Patient temperature HIDE    Collection site ARTERIAL LINE    Sample type ARTERIAL    Comment VALUES EXPECTED, NO REPEAT   MRSA PCR Screening     Status: None   Collection Time: 12/30/2017  4:34 PM  Result Value Ref Range   MRSA by PCR NEGATIVE NEGATIVE    Comment:        The GeneXpert  MRSA Assay (FDA approved for NASAL specimens only), is one component of a comprehensive MRSA colonization surveillance program. It is not intended to diagnose MRSA infection nor to guide or monitor treatment for MRSA infections. Performed at [facility], [place] [address], [place], [place] [address]   I-STAT 3, arterial blood gas (G3+)     Status: Abnormal   Collection Time: 01/10/2018  7:51 PM  Result Value Ref Range   pH, Arterial 7.340 (L) 7.350 - 7.450   pCO2 arterial 30.3 (L) 32.0 - 48.0 mmHg   pO2, Arterial 127.0 (H) 83.0 - 108.0 mmHg   Bicarbonate 16.4 (L) 20.0 - 28.0 mmol/L   TCO2 17 (L) 22 - 32 mmol/L   O2 Saturation 99.0 %   Acid-base deficit 8.0 (H) 0.0 - 2.0 mmol/L   Patient temperature 98.4 F    Collection site FEMORAL ARTERY    Drawn by VP    Sample type ARTERIAL   Glucose, capillary     Status: Abnormal   Collection Time: 01/08/2018  7:53 PM   Result Value Ref Range   Glucose-Capillary 471 (H) 70 - 99 mg/dL  Glucose, capillary     Status: Abnormal   Collection Time: 01/01/2018  8:59 PM  Result Value Ref Range   Glucose-Capillary 523 (HH) 70 - 99 mg/dL   Comment 1 Call MD NNP PA CNM    Comment 2 Document in Chart   Glucose, capillary     Status: Abnormal   Collection Time: 01/10/2018 10:00 PM  Result Value Ref Range   Glucose-Capillary 457 (H) 70 - 99 mg/dL  Troponin I - Now Then Q6H     Status: Abnormal   Collection Time: 12/13/2017 10:01 PM  Result Value Ref Range   Troponin I 1.80 (HH) <0.03 ng/mL    Comment: CRITICAL RESULT CALLED TO, READ BACK BY AND VERIFIED WITH: MOSELEY,C RN 12/12/2017 0016 JORDANS Performed at [facility], [address], [place], [place] [address]   Glucose, capillary     Status: Abnormal   Collection Time: 01/05/2018 11:04 PM  Result Value Ref Range   Glucose-Capillary 394 (H) 70 - 99 mg/dL  Glucose, capillary     Status: Abnormal   Collection Time: 12/12/17 12:02 AM  Result Value Ref Range   Glucose-Capillary 340 (H) 70 - 99 mg/dL  Glucose, capillary     Status: Abnormal   Collection Time: 12/12/17  1:08 AM  Result Value Ref Range   Glucose-Capillary 270 (H) 70 - 99 mg/dL  Glucose, capillary     Status: Abnormal   Collection Time: 12/12/17  2:01 AM  Result Value Ref Range   Glucose-Capillary 225 (H) 70 - 99 mg/dL  Glucose, capillary     Status: Abnormal   Collection Time: 12/12/17  2:58 AM  Result Value Ref Range   Glucose-Capillary 179 (H) 70 - 99 mg/dL  I-STAT 3, arterial blood gas (G3+)     Status: Abnormal   Collection Time: 12/12/17  3:36 AM  Result Value Ref Range   pH, Arterial 7.627 (HH) 7.350 - 7.450   pCO2 arterial 24.3 (L) 32.0 - 48.0 mmHg   pO2, Arterial 97.0 83.0 - 108.0 mmHg   Bicarbonate 25.4 20.0 - 28.0 mmol/L   TCO2 26 22 - 32 mmol/L   O2 Saturation 99.0 %   Acid-Base Excess 5.0 (H) 0.0 - 2.0 mmol/L   Patient temperature HIDE    Sample type ARTERIAL     Comment NOTIFIED PHYSICIAN  Troponin I - Now Then Q6H     Status: Abnormal   Collection Time: 12/12/17  3:58 AM  Result Value Ref Range   Troponin I 5.83 (HH) <0.03 ng/mL    Comment: CRITICAL RESULT CALLED TO, READ BACK BY AND VERIFIED WITH: J.MOSLEY,RN 12/12/17 0539 DAVISB Performed at [facility] [address], [place], [place] [address]   Basic metabolic panel     Status: Abnormal   Collection Time: 12/12/17  3:58 AM  Result Value Ref Range   Sodium 140 135 - 145 mmol/L   Potassium 3.0 (L) 3.5 - 5.1 mmol/L   Chloride 94 (L) 98 - 111 mmol/L   CO2 29 22 - 32 mmol/L   Glucose, Bld 114 (H) 70 - 99 mg/dL   BUN 57 (H) 8 - 23 mg/dL   Creatinine, Ser 9.11 (H) 0.61 - 1.24 mg/dL   Calcium 8.4 (L) 8.9 - 10.3 mg/dL   GFR calc non Af Amer 5 (L) >60 mL/min   GFR calc Af Amer 6 (L) >60 mL/min   Anion gap 17 (H) 5 - 15    Comment: Performed at [facility] [address] [place], [place] [address]  Phosphorus     Status: Abnormal   Collection Time: 12/12/17  3:58 AM  Result Value Ref Range   Phosphorus 2.3 (L) 2.5 - 4.6 mg/dL    Comment: Performed at [facility] [address], [place], [place] [address]  Magnesium     Status: None   Collection Time: 12/12/17  3:58 AM  Result Value Ref Range   Magnesium 1.8 1.7 - 2.4 mg/dL    Comment: Performed at [facility] [address] [place], [place] [address]  Glucose, capillary     Status: Abnormal   Collection Time: 12/12/17  3:58 AM  Result Value Ref Range   Glucose-Capillary 109 (H) 70 - 99 mg/dL  CBC     Status: Abnormal   Collection Time: 12/12/17  5:05 AM  Result Value Ref Range   WBC 10.5 4.0 - 10.5 K/uL   RBC 2.91 (L) 4.22 - 5.81 MIL/uL   Hemoglobin 9.1 (L) 13.0 - 17.0 g/dL    Comment: REPEATED TO VERIFY RESULTS VERIFIED VIA RECOLLECT CALLED TO C. MOSELEY, RN 1751 12/12/2017 BY MACEDA,J.    HCT 28.2 (L) 39.0 - 52.0 %   MCV 96.9 80.0 - 100.0 fL    Comment: REPEATED TO VERIFY RESULTS VERIFIED VIA  RECOLLECT CALLED TO Russ Halo, RN [phone] 12/12/2017 BY MACEDA,J.    MCH 31.3 26.0 - 34.0 pg   MCHC 32.3 30.0 - 36.0 g/dL   RDW 15.1 11.5 - 15.5 %   Platelets 142 (L) 150 - 400 K/uL    Comment: REPEATED TO VERIFY Performed at [facility], [place] [address] [place], [place] [address]   Glucose, capillary     Status: Abnormal   Collection Time: 12/12/17  5:05 AM  Result Value Ref Range   Glucose-Capillary 69 (L) 70 - 99 mg/dL  Glucose, capillary     Status: None   Collection Time: 12/12/17  5:27 AM  Result Value Ref Range   Glucose-Capillary 84 70 - 99 mg/dL  Glucose, capillary     Status: Abnormal   Collection Time: 12/12/17  6:47 AM  Result Value Ref Range   Glucose-Capillary 65 (L) 70 - 99 mg/dL  Glucose, capillary     Status: Abnormal   Collection Time: 12/12/17  6:48  AM  Result Value Ref Range   Glucose-Capillary 65 (L) 70 - 99 mg/dL  Glucose, capillary     Status: None   Collection Time: 12/12/17  7:09 AM  Result Value Ref Range   Glucose-Capillary 86 70 - 99 mg/dL  Glucose, capillary     Status: Abnormal   Collection Time: 12/12/17  8:13 AM  Result Value Ref Range   Glucose-Capillary 100 (H) 70 - 99 mg/dL   Comment 1 JT    Comment 2 Notify RN    Comment 3 Repeat Test     ECG   N/A  Telemetry   Sinus rhythm with PVC's and short run of NSVT - Personally Reviewed  Radiology    Ct Head Wo Contrast  Result Date: 01/01/2018 CLINICAL DATA:  Choked while drinking something and became unresponsive requiring CPR. EXAM: CT HEAD WITHOUT CONTRAST TECHNIQUE: Contiguous axial images were obtained from the base of the skull through the vertex without intravenous contrast. COMPARISON:  09/30/2014 FINDINGS: Brain: Ventricles and cisterns are unremarkable. There is mild age related atrophic change as well as mild chronic ischemic microvascular disease. There is no mass, mass effect, shift of midline structures or acute hemorrhage. No evidence of acute infarction. Vascular: No  hyperdense vessel or unexpected calcification. Skull: Normal. Negative for fracture or focal lesion. Sinuses/Orbits: Orbits are normal. Paranasal sinuses are well aerated as there is minimal opacification over the right sphenoid sinus. Other: None. IMPRESSION: No acute findings. Mild age related atrophic change and chronic ischemic microvascular disease. Minimal inflammatory change of the right sphenoid sinus. Electronically Signed   By: Marin Olp M.D.   On: 12/17/2017 17:43   Dg Chest Port 1 View  Result Date: 12/12/2017 CLINICAL DATA:  Endotracheal tube, respiratory failure. EXAM: PORTABLE CHEST 1 VIEW COMPARISON:  12/31/2017. FINDINGS: Endotracheal tube terminates 3.6 cm above the carina. Nasogastric tube terminates in the stomach. Heart is enlarged. Thoracic aorta is calcified. Mixed interstitial and airspace opacification is seen in a slightly different distribution than on 01/05/2018. Small right pleural effusion. IMPRESSION: 1. Congestive heart failure. 2.  Aortic atherosclerosis (ICD10-170.0). Electronically Signed   By: Lorin Picket M.D.   On: 12/12/2017 07:54   Dg Chest Portable 1 View  Result Date: 12/13/2017 CLINICAL DATA:  Post CPR from home after choking episode and becoming unresponsive. EXAM: PORTABLE CHEST 1 VIEW COMPARISON:  08/08/2016 FINDINGS: Sternotomy wires unchanged. Endotracheal tube has tip 1 cm above the carina. Enteric tube has side-port over the stomach in the left upper quadrant as tip is not visualized. Lungs are adequately inflated demonstrate patchy hazy prominence of the perihilar regions right worse than left which may be due to interstitial edema although infection is possible. No effusion or pneumothorax. Cardiomediastinal silhouette and remainder the exam is unchanged. IMPRESSION: Patchy hazy perihilar opacification right worse than left likely interstitial edema, although infection is possible. Tubes and lines as described. Note that the endotracheal tube has tip  1 cm above the carina and could be pulled back 2 cm. These results were called by telephone at the time of interpretation on 12/22/2017 at 4:10 pm to patient's nurse, Vicente Males, who verbally acknowledged these results. Electronically Signed   By: Marin Olp M.D.   On: 01/07/2018 16:10    Cardiac Studies   Echo pending  Assessment   Active Problems:   Cardiac arrest [facility])   Demand ischemia ([place])   Plan   1. Hemodynamically stable overnight- not responsive to exam today and off sedation. Plans per nephrology  for dialysis. Troponin rose to >5 - likely demand ischemia secondary to arrest - cannot exclude a graft failure. Plan for echo today. At this point, would not recommend moving to cardiac catheterization. Noted to be a limited code- no CPR/shocks. Would not institute therapeutic heparin.   Time Spent Directly with Patient:  I have spent a total of 35 minutes with the patient reviewing hospital notes, telemetry, EKGs, labs and examining the patient as well as establishing an assessment and plan that was discussed personally with the patient.  > 50% of time was spent in direct patient care.  Length of Stay:  LOS: 1 day   Pixie Casino, MD, [facility], [place] Director of the Advanced Lipid Disorders &  Cardiovascular Risk Reduction Clinic Diplomate of the American Board of Clinical Lipidology Attending Cardiologist  Direct Dial: [phone]  Fax: [phone]  Website:  [email or web address] 12/12/2017, 8:48 AM

## 2017-12-12 NOTE — Progress Notes (Signed)
EEG completed; results pending.    

## 2017-12-12 NOTE — Procedures (Signed)
History: 77 yo M s/p cardiac arrest  Sedation: none  Technique: This is a 21 channel routine scalp EEG performed at the bedside with bipolar and monopolar montages arranged in accordance to the international 10/20 system of electrode placement. One channel was dedicated to EKG recording.    Background: The background consists of generalized irregular delta and theta activities. There are generalized periodic discharges with delta wave or triphasic morphology. Occasionally these almost resemble a burst suppression pattern without complete suppression in the interburst interval.   Photic stimulation: Physiologic driving is not performed  EEG Abnormalities: 1) generalized periodic discharges with delta or triphasic morphology.  2) Generalized slow activity  Clinical Interpretation: This EEG is most consistent with a generalized non-specific cerebral dysfunction(encephalopathy). Though non-specific, this pattern can be seen in post-arrest anoxic brain injury, but I do not feel that this EEG alone could signify poor prognosis.    No seizure was observed.   Roland Rack, MD [facility] [phone]  If 7pm- 7am, please page neurology on call as listed in [place].

## 2017-12-12 NOTE — Progress Notes (Signed)
IO removed at this time. Pt tolerated well.

## 2017-12-12 NOTE — Progress Notes (Signed)
  Echocardiogram 2D Echocardiogram has been performed.  Aaron Long 12/12/2017, 11:52 AM

## 2017-12-12 NOTE — Progress Notes (Signed)
Pt having several hypoglycemic episodes; insulin drip held, D50 given per GlucoStabilizer x 2 this shift. Will continue to monitor.

## 2017-12-12 NOTE — Progress Notes (Signed)
[facility] Progress Note Patient Name: Aaron Long DOB: Apr 14, 1940 MRN: [id number]   Date of Service  12/12/2017  HPI/Events of Note  K+ 3.0, Pt has ESRD. Elevated Troponin in the context of recent cardiac arrest post-resuscitation.  eICU Interventions  Continue to trend Troponin, cardiology consultation, Aspirin 325 mg daily, KCL 20 meq iv bolus over 2 hours then recheck K+        [name] 12/12/2017, 6:07 AM

## 2017-12-12 NOTE — Progress Notes (Signed)
CRITICAL VALUE ALERT  Critical Value:  Troponin 5.83  Date & Time Notied:  12/12/17 @ 5:39 AM  Provider Notified: eLink notified  Orders Received/Actions taken: No new orders at this time.

## 2017-12-12 NOTE — Progress Notes (Signed)
[facility]Link Physician-Brief Progress Note Patient Name: Aaron Long DOB: 08-15-1940 MRN: [id number]   Date of Service  12/12/2017  HPI/Events of Note  Mixed acid/base disorder with bicarb infusion induced metabolic alkalosis exacerbated by a respiratory alkalosis  eICU Interventions  Bicarb infusion discontinued, Tidal volume reduced to 7 ml/kg and respiratory rate to 20 to reduce minute ventilation        [name] 12/12/2017, 3:54 AM

## 2017-12-13 ENCOUNTER — Other Ambulatory Visit: Payer: Self-pay

## 2017-12-13 LAB — CBC
HCT: 31.9 % — ABNORMAL LOW (ref 39.0–52.0)
Hemoglobin: 10.2 g/dL — ABNORMAL LOW (ref 13.0–17.0)
MCH: 30.9 pg (ref 26.0–34.0)
MCHC: 32 g/dL (ref 30.0–36.0)
MCV: 96.7 fL (ref 80.0–100.0)
NRBC: 0.2 % (ref 0.0–0.2)
Platelets: 133 10*3/uL — ABNORMAL LOW (ref 150–400)
RBC: 3.3 MIL/uL — ABNORMAL LOW (ref 4.22–5.81)
RDW: 15.6 % — ABNORMAL HIGH (ref 11.5–15.5)
WBC: 10.6 10*3/uL — AB (ref 4.0–10.5)

## 2017-12-13 LAB — BASIC METABOLIC PANEL
Anion gap: 20 — ABNORMAL HIGH (ref 5–15)
BUN: 41 mg/dL — AB (ref 8–23)
CO2: 22 mmol/L (ref 22–32)
CREATININE: 6.47 mg/dL — AB (ref 0.61–1.24)
Calcium: 8.6 mg/dL — ABNORMAL LOW (ref 8.9–10.3)
Chloride: 93 mmol/L — ABNORMAL LOW (ref 98–111)
GFR calc non Af Amer: 8 mL/min — ABNORMAL LOW (ref >60)
GFR, EST AFRICAN AMERICAN: 9 mL/min — AB (ref >60)
Glucose, Bld: 178 mg/dL — ABNORMAL HIGH (ref 70–99)
Potassium: 5.3 mmol/L — ABNORMAL HIGH (ref 3.5–5.1)
SODIUM: 135 mmol/L (ref 135–145)

## 2017-12-13 LAB — GLUCOSE, CAPILLARY
Glucose-Capillary: 121 mg/dL — ABNORMAL HIGH (ref 70–99)
Glucose-Capillary: 113 mg/dL — ABNORMAL HIGH (ref 70–99)
Glucose-Capillary: 138 mg/dL — ABNORMAL HIGH (ref 70–99)
Glucose-Capillary: 144 mg/dL — ABNORMAL HIGH (ref 70–99)
Glucose-Capillary: 144 mg/dL — ABNORMAL HIGH (ref 70–99)
Glucose-Capillary: 167 mg/dL — ABNORMAL HIGH (ref 70–99)

## 2017-12-13 MED ORDER — JEVITY 1.2 CAL PO LIQD: 1000.0000 mL | ORAL | Status: DC

## 2017-12-13 MED ORDER — ALBUTEROL SULFATE (2.5 MG/3ML) 0.083% IN NEBU: 2.5000 mg | INHALATION_SOLUTION | RESPIRATORY_TRACT | Status: DC | PRN

## 2017-12-13 MED ORDER — VITAL AF 1.2 CAL PO LIQD
1000.0000 mL | ORAL | Status: DC
  Administered 2017-12-13 – 2017-12-15 (×3): 1000 mL
  Filled 2017-12-13 (×2): qty 1500
  Filled 2017-12-13: qty 1000
  Filled 2017-12-13 (×4): qty 1500

## 2017-12-13 NOTE — Progress Notes (Signed)
Nutrition Consult/Follow Up  DOCUMENTATION CODES:   Not applicable  INTERVENTION:    Initiate Vital AF 1.2 at goal rate of 60 ml/h (1440 ml per day)   Provides 1728 kcals, 108 gm protein, 1167 ml free water daily  NUTRITION DIAGNOSIS:   Inadequate oral intake related to inability to eat as evidenced by NPO status, ongoing  GOAL:   Patient will meet greater than or equal to 90% of their needs, progressing  MONITOR:   Vent status, I & O's, Labs, Skin, Weight trends, Other (GOC)  ASSESSMENT:   77 yo Male who presented to the ED on 12/1 post-CPR from home. PMH significant for ESRD on HD, PVD with bilateral BKA, hypertension, prostate cancer, hyperlipidemia, T2DM, and CAD s/p CABG x 4.    Patient is currently intubated on ventilator support MV: 9.6 L/min Temp (24hrs), Avg:98.7 F (37.1 C), Min:97.9 F (36.6 C), Max:99.1 F (37.3 C)  OGT in place  RD consulted for TF initiation & management. Pt is s/p PEA cardiac arrest due to choking episode. ESRD on HD. Nephrology following. S/p HD 12/2.  Cardiology not recommending cath at this time. Labs & medications reviewed. K 5.3 (H). CBG's [phone].  Diet Order:   Diet Order    None     EDUCATION NEEDS:   Not appropriate for education at this time  Skin:  Skin Assessment: Skin Integrity Issues: Stage II: sacrum  Last BM:  12/3   Intake/Output Summary (Last 24 hours) at 12/13/2017 1514 Last data filed at 12/13/2017 1000 Gross per 24 hour  Intake 165.08 ml  Output 3033 ml  Net -2867.92 ml   Height:   Ht Readings from Last 1 Encounters:  12/13/17 5\' 8"  (1.727 m)   Weight:   Wt Readings from Last 1 Encounters:  12/13/17 72 kg   BMI:  27.6 kg/m2 (adjusted for bilateral BKA's)  Estimated Nutritional Needs:   Kcal:  1678  Protein:  105-120 gm  Fluid:  per MD  Arthur Holms, RD, LDN Pager #: [phone] After-Hours Pager #: [phone]

## 2017-12-13 NOTE — Progress Notes (Signed)
Patient ID: Aaron Long, male   DOB: 1940-11-23, 77 y.o.   MRN: [id number]  [facility] Progress Note   Assessment/ Plan:   1.  Status post cardiac arrest: Suffered outside the hospital with 10 minutes of resuscitation required.  Appears hemodynamically stable and was able to tolerate dialysis yesterday without problems. 2. ESRD: Usually on a Monday/Wednesday/Friday schedule and will order for hemodialysis again tomorrow, he is euvolemic on physical exam and does not have any labs from this morning. 3. Anemia: Downtrending hemoglobin noted, no overt loss noted-recheck labs tomorrow morning.  Continue Aranesp. 4. CKD-MBD: Currently n.p.o. while on ventilator, binders on hold. 5.  Altered level of consciousness: From hypoxic encephalopathy following arrest-prognosis guarded.  Subjective:   Hypoglycemia events noted overnight with earlier labs today showing hypokalemia.   Objective:   BP 122/77   Pulse 93   Temp 99.1 F (37.3 C) (Oral)   Resp 16   Ht _0  (1.346 m) Comment: amputated height  Wt 72 kg   SpO2 100%   BMI 39.73 kg/m   Physical Exam: Gen: Appears to be comfortable on ventilator.  Attempts to open eyes to calling out his name. CVS: Pulse regular rhythm, normal rate, S1 and S2 normal Resp: Anteriorly clear to auscultation, no rales/rhonchi Abd: Soft, flat, nontender Ext: Status post bilateral below-knee amputations-no ulcers/wounds  Labs: BMET Recent Labs  Lab 12/24/2017 1539 01/06/2018 1553 12/25/2017 1558 12/12/17 0358  NA 138  --  131* 140  K 3.8  --  5.7* 3.0*  CL 102  --  99 94*  CO2 26  --   --  29  GLUCOSE 121*  --  282* 114*  BUN 5*  --  67* 57*  CREATININE 0.84  --  7.80* 9.11*  CALCIUM 9.1  --   --  8.4*  PHOS  --  8.7*  --  2.3*   CBC Recent Labs  Lab 12/20/2017 1539 12/29/2017 1558 12/12/17 0505  WBC 8.1  --  10.5  NEUTROABS 5.1  --   --   HGB 14.3 10.5* 9.1*  HCT 46.6 31.0* 28.2*  MCV 83.8  --  96.9  PLT 330  --  142*    Medications:    . chlorhexidine gluconate (MEDLINE KIT)  15 mL Mouth Rinse BID  . Chlorhexidine Gluconate Cloth  6 each Topical Q0600  . [START ON 12/14/2017] darbepoetin (ARANESP) injection - DIALYSIS  60 mcg Intravenous Q Wed-HD  . heparin  5,000 Units Subcutaneous Q8H  . insulin aspart  1-3 Units Subcutaneous Q4H  . insulin detemir  5 Units Subcutaneous Q12H  . mouth rinse  15 mL Mouth Rinse 10 times per day  . sodium chloride flush  10-40 mL Intracatheter Q12H   Elmarie Shiley, MD 12/13/2017, 8:17 AM

## 2017-12-13 NOTE — Progress Notes (Signed)
Referral made to [facility], spoke with Rocky Crafts, referral number [id number]. Per [facility] possibly suitable for skin/tissue donation; instructed to call back with cardiac time of death or if brain death testing is to be performed. Will continue to monitor.  Sherlie Ban, RN

## 2017-12-13 NOTE — Progress Notes (Signed)
DAILY PROGRESS NOTE   Patient Name: Aaron Long Date of Encounter: 12/13/2017  Chief Complaint   Intubated, sedated on vent  Patient Profile   77 yo male with a hx of coronary artery disease and end-stage renal disease status post bypass presented with PEA arrest. Last cath showed all 4 grafts patent in 04/2016, LVEF between 25-40%.  Subjective   No events overnight. Tolerated HD - EEG shows no seizure, however more consistent with encephalopathy, probably HIE. Echo yesterday shows LVEF around 20-25% which is consistent with prior studies. Volume management per HD.  Objective   Vitals:   12/13/17 0600 12/13/17 0700 12/13/17 0715 12/13/17 0736  BP: 107/73 122/77 122/77   Pulse: 95 96 93   Resp: 16 16 16    Temp:    99.1 F (37.3 C)  TempSrc:    Oral  SpO2: 100% 100% 100%   Weight:      Height:        Intake/Output Summary (Last 24 hours) at 12/13/2017 0826 Last data filed at 12/13/2017 0745 Gross per 24 hour  Intake 259.26 ml  Output 3033 ml  Net -2773.74 ml   Filed Weights   12/12/17 1415 12/12/17 1900 12/13/17 0129  Weight: 76.4 kg 72.7 kg 72 kg    Physical Exam   General appearance: intubated on vent, not sedated, opens eyes spontaneously Neck: no carotid bruit, no JVD and thyroid not enlarged, symmetric, no tenderness/mass/nodules Lungs: clear to auscultation bilaterally Heart: regular rate and rhythm Abdomen: soft, non-tender; bowel sounds normal; no masses,  no organomegaly Extremities: bilateral BKA Pulses: adequate UE pulses Skin: Skin color, texture, turgor normal. No rashes or lesions or LUE fistula Neurologic: Mental status: does not awaken with stimulation or follow commands, spontaneous eye opening Psych: Cannot assess  Inpatient Medications    Scheduled Meds: . chlorhexidine gluconate (MEDLINE KIT)  15 mL Mouth Rinse BID  . Chlorhexidine Gluconate Cloth  6 each Topical Q0600  . [START ON 12/14/2017] darbepoetin (ARANESP) injection -  DIALYSIS  60 mcg Intravenous Q Wed-HD  . heparin  5,000 Units Subcutaneous Q8H  . insulin aspart  1-3 Units Subcutaneous Q4H  . insulin detemir  5 Units Subcutaneous Q12H  . mouth rinse  15 mL Mouth Rinse 10 times per day  . sodium chloride flush  10-40 mL Intracatheter Q12H    Continuous Infusions: . epinephrine    . famotidine (PEPCID) IV    . insulin Stopped (12/12/17 0506)  . niCARDipine Stopped (12/12/17 0734)  . norepinephrine (LEVOPHED) Adult infusion Stopped (01/04/2018 1713)    PRN Meds: fentaNYL (SUBLIMAZE) injection, fentaNYL (SUBLIMAZE) injection, sodium chloride flush   Labs   Results for orders placed or performed during the hospital encounter of 12/26/2017 (from the past 48 hour(s))  Comprehensive metabolic panel     Status: Abnormal   Collection Time: 01/04/2018  3:39 PM  Result Value Ref Range   Sodium 138 135 - 145 mmol/L   Potassium 3.8 3.5 - 5.1 mmol/L   Chloride 102 98 - 111 mmol/L   CO2 26 22 - 32 mmol/L   Glucose, Bld 121 (H) 70 - 99 mg/dL   BUN 5 (L) 8 - 23 mg/dL   Creatinine, Ser 0.84 0.61 - 1.24 mg/dL   Calcium 9.1 8.9 - 10.3 mg/dL   Total Protein 8.1 6.5 - 8.1 g/dL   Albumin 3.6 3.5 - 5.0 g/dL   AST 20 15 - 41 U/L   ALT 19 0 - 44 U/L  Alkaline Phosphatase 75 38 - 126 U/L   Total Bilirubin 0.2 (L) 0.3 - 1.2 mg/dL   GFR calc non Af Amer >60 >60 mL/min   GFR calc Af Amer >60 >60 mL/min   Anion gap 10 5 - 15    Comment: Performed at [facility] [address], [place], [place] [address]  Troponin I - Once     Status: None   Collection Time: 12/23/2017  3:39 PM  Result Value Ref Range   Troponin I <0.03 <0.03 ng/mL    Comment: Performed at [facility] [address], [place], [place] [address]  CBC with Differential     Status: Abnormal   Collection Time: 12/26/2017  3:39 PM  Result Value Ref Range   WBC 8.1 4.0 - 10.5 K/uL   RBC 5.56 4.22 - 5.81 MIL/uL   Hemoglobin 14.3 13.0 - 17.0 g/dL   HCT 46.6 39.0 - 52.0 %   MCV 83.8 80.0 -  100.0 fL   MCH 25.7 (L) 26.0 - 34.0 pg   MCHC 30.7 30.0 - 36.0 g/dL   RDW 14.4 11.5 - 15.5 %   Platelets 330 150 - 400 K/uL   nRBC 0.0 0.0 - 0.2 %   Neutrophils Relative % 63 %   Neutro Abs 5.1 1.7 - 7.7 K/uL   Lymphocytes Relative 27 %   Lymphs Abs 2.2 0.7 - 4.0 K/uL   Monocytes Relative 6 %   Monocytes Absolute 0.5 0.1 - 1.0 K/uL   Eosinophils Relative 4 %   Eosinophils Absolute 0.3 0.0 - 0.5 K/uL   Basophils Relative 0 %   Basophils Absolute 0.0 0.0 - 0.1 K/uL   Immature Granulocytes 0 %   Abs Immature Granulocytes 0.02 0.00 - 0.07 K/uL    Comment: Performed at [facility] [address] [place], [place] [address]  Protime-INR     Status: None   Collection Time: 12/31/2017  3:39 PM  Result Value Ref Range   Prothrombin Time 13.0 11.4 - 15.2 seconds   INR 0.99     Comment: Performed at [facility] [address], [place], [place] [address]  Triglycerides     Status: None   Collection Time: 12/27/2017  3:39 PM  Result Value Ref Range   Triglycerides 73 <150 mg/dL    Comment: Performed at [facility] [address], [place], [place] [address]  Procalcitonin     Status: None   Collection Time: 12/31/2017  3:39 PM  Result Value Ref Range   Procalcitonin <0.10 ng/mL    Comment:        Interpretation: PCT (Procalcitonin) <= 0.5 ng/mL: Systemic infection (sepsis) is not likely. Local bacterial infection is possible. (NOTE)       Sepsis PCT Algorithm           Lower Respiratory Tract                                      Infection PCT Algorithm    ----------------------------     ----------------------------         PCT < 0.25 ng/mL                PCT < 0.10 ng/mL         Strongly encourage             Strongly discourage  discontinuation of antibiotics    initiation of antibiotics    ----------------------------     -----------------------------       PCT 0.25 - 0.50 ng/mL            PCT 0.10 - 0.25 ng/mL               OR       >80% decrease in PCT             Discourage initiation of                                            antibiotics      Encourage discontinuation           of antibiotics    ----------------------------     -----------------------------         PCT >= 0.50 ng/mL              PCT 0.26 - 0.50 ng/mL               AND        <80% decrease in PCT             Encourage initiation of                                             antibiotics       Encourage continuation           of antibiotics    ----------------------------     -----------------------------        PCT >= 0.50 ng/mL                  PCT > 0.50 ng/mL               AND         increase in PCT                  Strongly encourage                                      initiation of antibiotics    Strongly encourage escalation           of antibiotics                                     -----------------------------                                           PCT <= 0.25 ng/mL                                                 OR                                        >  80% decrease in PCT                                     Discontinue / Do not initiate                                             antibiotics Performed at [facility], [place] [address] [place], [place] [address]   Hemoglobin A1c     Status: Abnormal   Collection Time: 12/20/2017  3:53 PM  Result Value Ref Range   Hgb A1c MFr Bld 7.6 (H) 4.8 - 5.6 %    Comment: (NOTE) Pre diabetes:          5.7%-6.4% Diabetes:              >6.4% Glycemic control for   <7.0% adults with diabetes    Mean Plasma Glucose 171.42 mg/dL    Comment: Performed at [facility] [address], [place], [place] [address]  Magnesium     Status: None   Collection Time: 01/02/2018  3:53 PM  Result Value Ref Range   Magnesium 2.2 1.7 - 2.4 mg/dL    Comment: Performed at [facility], [place] [address], [place], [place] [address]  Phosphorus     Status: Abnormal   Collection Time: 01/09/2018  3:53 PM  Result  Value Ref Range   Phosphorus 8.7 (H) 2.5 - 4.6 mg/dL    Comment: Performed at [facility] [address], [place], [place] [address]  Amylase     Status: Abnormal   Collection Time: 12/15/2017  3:53 PM  Result Value Ref Range   Amylase 134 (H) 28 - 100 U/L    Comment: Performed at [facility], [place] [address] [place], [place] [address]  Lipase, blood     Status: Abnormal   Collection Time: 01/03/2018  3:53 PM  Result Value Ref Range   Lipase 140 (H) 11 - 51 U/L    Comment: Performed at [facility] [address], [place], [place] [address]  Procalcitonin     Status: None   Collection Time: 01/05/2018  3:53 PM  Result Value Ref Range   Procalcitonin 0.10 ng/mL    Comment:        Interpretation: PCT (Procalcitonin) <= 0.5 ng/mL: Systemic infection (sepsis) is not likely. Local bacterial infection is possible. QUESTIONABLE RESULTS, RECOMMEND RECOLLECT TO VERIFY HEMOLYSIS AT THIS LEVEL MAY AFFECT RESULT NOTIFIED S.ADAMS,RN @ 1820 01/09/2018 WEBBERJ (NOTE)       Sepsis PCT Algorithm           Lower Respiratory Tract                                      Infection PCT Algorithm    ----------------------------     ----------------------------         PCT < 0.25 ng/mL                PCT < 0.10 ng/mL         Strongly encourage             Strongly discourage   discontinuation of antibiotics  initiation of antibiotics    ----------------------------     -----------------------------       PCT 0.25 - 0.50 ng/mL            PCT 0.10 - 0.25 ng/mL               OR       >80% decrease in PCT            Discourage initiation of                                            antibiotics      Encourage discontinuation           of antibiotics    ------- ---------------------     -----------------------------         PCT >= 0.50 ng/mL              PCT 0.26 - 0.50 ng/mL               AND       <80% decrease in PCT             Encourage initiation of                                              antibiotics       Encourage continuation           of antibiotics    ----------------------------     -----------------------------        PCT >= 0.50 ng/mL                  PCT > 0.50 ng/mL               AND         increase in PCT                  Strongly encourage                                      initiation of antibiotics    Strongly encourage escalation           of antibiotics                                     -----------------------------                                           PCT <= 0.25 ng/mL                                                 OR                                        >  80% decrease in PCT                                     Discontinue / Do not initiate                                             a ntibiotics Performed at [facility], [place] [address] [place], [place] [address]   Brain natriuretic peptide     Status: Abnormal   Collection Time: 12/25/2017  3:53 PM  Result Value Ref Range   B Natriuretic Peptide 3,648.6 (H) 0.0 - 100.0 pg/mL    Comment: Performed at [facility] [address], [place],  [address]  I-stat troponin, ED     Status: Abnormal   Collection Time: 01/08/2018  3:57 PM  Result Value Ref Range   Troponin i, poc 0.84 (HH) 0.00 - 0.08 ng/mL   Comment NOTIFIED PHYSICIAN    Comment 3            Comment: Due to the release kinetics of cTnI, a negative result within the first hours of the onset of symptoms does not rule out myocardial infarction with certainty. If myocardial infarction is still suspected, repeat the test at appropriate intervals.   I-stat Chem 8, ED     Status: Abnormal   Collection Time: 01/07/2018  3:58 PM  Result Value Ref Range   Sodium 131 (L) 135 - 145 mmol/L   Potassium 5.7 (H) 3.5 - 5.1 mmol/L   Chloride 99 98 - 111 mmol/L   BUN 67 (H) 8 - 23 mg/dL   Creatinine, Ser 7.80 (H) 0.61 - 1.24 mg/dL   Glucose, Bld 282 (H) 70 - 99 mg/dL   Calcium, Ion 0.92 (L) 1.15 - 1.40 mmol/L     TCO2 24 22 - 32 mmol/L   Hemoglobin 10.5 (L) 13.0 - 17.0 g/dL   HCT 31.0 (L) 39.0 - 52.0 %  I-Stat CG4 Lactic Acid, ED     Status: Abnormal   Collection Time: 12/20/2017  4:00 PM  Result Value Ref Range   Lactic Acid, Venous 5.36 (HH) 0.5 - 1.9 mmol/L   Comment NOTIFIED PHYSICIAN   CBG monitoring, ED     Status: Abnormal   Collection Time: 12/18/2017  4:04 PM  Result Value Ref Range   Glucose-Capillary 315 (H) 70 - 99 mg/dL  I-Stat arterial blood gas, ED     Status: Abnormal   Collection Time: 01/09/2018  4:06 PM  Result Value Ref Range   pH, Arterial 7.091 (LL) 7.350 - 7.450   pCO2 arterial 27.8 (L) 32.0 - 48.0 mmHg   pO2, Arterial 21.0 (LL) 83.0 - 108.0 mmHg   Bicarbonate 8.4 (L) 20.0 - 28.0 mmol/L   TCO2 9 (L) 22 - 32 mmol/L   O2 Saturation 21.0 %   Acid-base deficit 19.0 (H) 0.0 - 2.0 mmol/L   Patient temperature HIDE    Collection site ARTERIAL LINE    Sample type ARTERIAL    Comment VALUES EXPECTED, NO REPEAT   MRSA PCR Screening     Status: None   Collection Time: 12/23/2017  4:34 PM  Result Value Ref Range   MRSA by PCR NEGATIVE NEGATIVE    Comment:        The  GeneXpert MRSA Assay (FDA approved for NASAL specimens only), is one component of a comprehensive MRSA colonization surveillance program. It is not intended to diagnose MRSA infection nor to guide or monitor treatment for MRSA infections. Performed at [facility], [place] [address], [place], [place] [address]   I-STAT 3, arterial blood gas (G3+)     Status: Abnormal   Collection Time: 12/28/2017  7:51 PM  Result Value Ref Range   pH, Arterial 7.340 (L) 7.350 - 7.450   pCO2 arterial 30.3 (L) 32.0 - 48.0 mmHg   pO2, Arterial 127.0 (H) 83.0 - 108.0 mmHg   Bicarbonate 16.4 (L) 20.0 - 28.0 mmol/L   TCO2 17 (L) 22 - 32 mmol/L   O2 Saturation 99.0 %   Acid-base deficit 8.0 (H) 0.0 - 2.0 mmol/L   Patient temperature 98.4 F    Collection site FEMORAL ARTERY    Drawn by VP    Sample type ARTERIAL   Glucose,  capillary     Status: Abnormal   Collection Time: 01/09/2018  7:53 PM  Result Value Ref Range   Glucose-Capillary 471 (H) 70 - 99 mg/dL  Glucose, capillary     Status: Abnormal   Collection Time: 12/12/2017  8:59 PM  Result Value Ref Range   Glucose-Capillary 523 (HH) 70 - 99 mg/dL   Comment 1 Call MD NNP PA CNM    Comment 2 Document in Chart   Glucose, capillary     Status: Abnormal   Collection Time: 01/08/2018 10:00 PM  Result Value Ref Range   Glucose-Capillary 457 (H) 70 - 99 mg/dL  Troponin I - Now Then Q6H     Status: Abnormal   Collection Time: 01/10/2018 10:01 PM  Result Value Ref Range   Troponin I 1.80 (HH) <0.03 ng/mL    Comment: CRITICAL RESULT CALLED TO, READ BACK BY AND VERIFIED WITH: MOSELEY,C RN 12/12/2017 0016 JORDANS Performed at [facility], [address], [place], [place] [address]   Glucose, capillary     Status: Abnormal   Collection Time: 12/19/2017 11:04 PM  Result Value Ref Range   Glucose-Capillary 394 (H) 70 - 99 mg/dL  Glucose, capillary     Status: Abnormal   Collection Time: 12/12/17 12:02 AM  Result Value Ref Range   Glucose-Capillary 340 (H) 70 - 99 mg/dL  Glucose, capillary     Status: Abnormal   Collection Time: 12/12/17  1:08 AM  Result Value Ref Range   Glucose-Capillary 270 (H) 70 - 99 mg/dL  Glucose, capillary     Status: Abnormal   Collection Time: 12/12/17  2:01 AM  Result Value Ref Range   Glucose-Capillary 225 (H) 70 - 99 mg/dL  Glucose, capillary     Status: Abnormal   Collection Time: 12/12/17  2:58 AM  Result Value Ref Range   Glucose-Capillary 179 (H) 70 - 99 mg/dL  I-STAT 3, arterial blood gas (G3+)     Status: Abnormal   Collection Time: 12/12/17  3:36 AM  Result Value Ref Range   pH, Arterial 7.627 (HH) 7.350 - 7.450   pCO2 arterial 24.3 (L) 32.0 - 48.0 mmHg   pO2, Arterial 97.0 83.0 - 108.0 mmHg   Bicarbonate 25.4 20.0 - 28.0 mmol/L   TCO2 26 22 - 32 mmol/L   O2 Saturation 99.0 %   Acid-Base Excess 5.0 (H) 0.0 - 2.0  mmol/L   Patient temperature HIDE    Sample type ARTERIAL    Comment NOTIFIED PHYSICIAN  Troponin I - Now Then Q6H     Status: Abnormal   Collection Time: 12/12/17  3:58 AM  Result Value Ref Range   Troponin I 5.83 (HH) <0.03 ng/mL    Comment: CRITICAL RESULT CALLED TO, READ BACK BY AND VERIFIED WITH: J.MOSLEY,RN 12/12/17 0539 DAVISB Performed at [facility] [address] [place], [place] [address]   Basic metabolic panel     Status: Abnormal   Collection Time: 12/12/17  3:58 AM  Result Value Ref Range   Sodium 140 135 - 145 mmol/L   Potassium 3.0 (L) 3.5 - 5.1 mmol/L   Chloride 94 (L) 98 - 111 mmol/L   CO2 29 22 - 32 mmol/L   Glucose, Bld 114 (H) 70 - 99 mg/dL   BUN 57 (H) 8 - 23 mg/dL   Creatinine, Ser 9.11 (H) 0.61 - 1.24 mg/dL   Calcium 8.4 (L) 8.9 - 10.3 mg/dL   GFR calc non Af Amer 5 (L) >60 mL/min   GFR calc Af Amer 6 (L) >60 mL/min   Anion gap 17 (H) 5 - 15    Comment: Performed at [facility] [address], [place], [place] [address]  Phosphorus     Status: Abnormal   Collection Time: 12/12/17  3:58 AM  Result Value Ref Range   Phosphorus 2.3 (L) 2.5 - 4.6 mg/dL    Comment: Performed at [facility] [address] [place], [place] [address]  Magnesium     Status: None   Collection Time: 12/12/17  3:58 AM  Result Value Ref Range   Magnesium 1.8 1.7 - 2.4 mg/dL    Comment: Performed at [facility] [address], [place], [place] [address]  Glucose, capillary     Status: Abnormal   Collection Time: 12/12/17  3:58 AM  Result Value Ref Range   Glucose-Capillary 109 (H) 70 - 99 mg/dL  CBC     Status: Abnormal   Collection Time: 12/12/17  5:05 AM  Result Value Ref Range   WBC 10.5 4.0 - 10.5 K/uL   RBC 2.91 (L) 4.22 - 5.81 MIL/uL   Hemoglobin 9.1 (L) 13.0 - 17.0 g/dL    Comment: REPEATED TO VERIFY RESULTS VERIFIED VIA RECOLLECT CALLED TO C. MOSELEY, RN 0998 12/12/2017 BY MACEDA,J.    HCT 28.2 (L) 39.0 - 52.0 %   MCV 96.9 80.0 -  100.0 fL    Comment: REPEATED TO VERIFY RESULTS VERIFIED VIA RECOLLECT CALLED TO Russ Halo, RN [phone] 12/12/2017 BY MACEDA,J.    MCH 31.3 26.0 - 34.0 pg   MCHC 32.3 30.0 - 36.0 g/dL   RDW 15.1 11.5 - 15.5 %   Platelets 142 (L) 150 - 400 K/uL    Comment: REPEATED TO VERIFY Performed at [facility], [place] [address], [place], [place] [address]   Glucose, capillary     Status: Abnormal   Collection Time: 12/12/17  5:05 AM  Result Value Ref Range   Glucose-Capillary 69 (L) 70 - 99 mg/dL  Glucose, capillary     Status: None   Collection Time: 12/12/17  5:27 AM  Result Value Ref Range   Glucose-Capillary 84 70 - 99 mg/dL  Glucose, capillary     Status: Abnormal   Collection Time: 12/12/17  6:47 AM  Result Value Ref Range   Glucose-Capillary 65 (L) 70 - 99 mg/dL  Glucose, capillary     Status: Abnormal   Collection Time: 12/12/17  6:48  AM  Result Value Ref Range   Glucose-Capillary 65 (L) 70 - 99 mg/dL  Glucose, capillary     Status: None   Collection Time: 12/12/17  7:09 AM  Result Value Ref Range   Glucose-Capillary 86 70 - 99 mg/dL  Iron and TIBC     Status: Abnormal   Collection Time: 12/12/17  7:47 AM  Result Value Ref Range   Iron 28 (L) 45 - 182 ug/dL   TIBC 192 (L) 250 - 450 ug/dL   Saturation Ratios 15 (L) 17.9 - 39.5 %   UIBC 164 ug/dL    Comment: Performed at [facility], [place] [address], [place], [place] [address]  Ferritin     Status: Abnormal   Collection Time: 12/12/17  7:47 AM  Result Value Ref Range   Ferritin 6,173 (H) 24 - 336 ng/mL    Comment: Performed at [facility], [place] [address] [place], [place] [address]  Glucose, capillary     Status: Abnormal   Collection Time: 12/12/17  8:13 AM  Result Value Ref Range   Glucose-Capillary 100 (H) 70 - 99 mg/dL   Comment 1 JT    Comment 2 Notify RN    Comment 3 Repeat Test   Glucose, capillary     Status: Abnormal   Collection Time: 12/12/17 11:35 AM  Result Value Ref Range    Glucose-Capillary 135 (H) 70 - 99 mg/dL  Glucose, capillary     Status: Abnormal   Collection Time: 12/12/17  4:05 PM  Result Value Ref Range   Glucose-Capillary 103 (H) 70 - 99 mg/dL   Comment 1 Notify RN   Glucose, capillary     Status: Abnormal   Collection Time: 12/12/17  9:15 PM  Result Value Ref Range   Glucose-Capillary 111 (H) 70 - 99 mg/dL  Glucose, capillary     Status: Abnormal   Collection Time: 12/12/17 11:36 PM  Result Value Ref Range   Glucose-Capillary 113 (H) 70 - 99 mg/dL  Glucose, capillary     Status: Abnormal   Collection Time: 12/13/17  4:24 AM  Result Value Ref Range   Glucose-Capillary 121 (H) 70 - 99 mg/dL  Glucose, capillary     Status: Abnormal   Collection Time: 12/13/17  7:27 AM  Result Value Ref Range   Glucose-Capillary 113 (H) 70 - 99 mg/dL    ECG   N/A  Telemetry   Sinus rhythm with PVC's and short run of NSVT - Personally Reviewed  Radiology    Ct Head Wo Contrast  Result Date: 01/09/2018 CLINICAL DATA:  Choked while drinking something and became unresponsive requiring CPR. EXAM: CT HEAD WITHOUT CONTRAST TECHNIQUE: Contiguous axial images were obtained from the base of the skull through the vertex without intravenous contrast. COMPARISON:  09/30/2014 FINDINGS: Brain: Ventricles and cisterns are unremarkable. There is mild age related atrophic change as well as mild chronic ischemic microvascular disease. There is no mass, mass effect, shift of midline structures or acute hemorrhage. No evidence of acute infarction. Vascular: No hyperdense vessel or unexpected calcification. Skull: Normal. Negative for fracture or focal lesion. Sinuses/Orbits: Orbits are normal. Paranasal sinuses are well aerated as there is minimal opacification over the right sphenoid sinus. Other: None. IMPRESSION: No acute findings. Mild age related atrophic change and chronic ischemic microvascular disease. Minimal inflammatory change of the right sphenoid sinus.  Electronically Signed   By: Marin Olp M.D.   On: 01/01/2018 17:43   Dg Chest [facility]  1 View  Result Date: 12/12/2017 CLINICAL DATA:  Endotracheal tube, respiratory failure. EXAM: PORTABLE CHEST 1 VIEW COMPARISON:  12/19/2017. FINDINGS: Endotracheal tube terminates 3.6 cm above the carina. Nasogastric tube terminates in the stomach. Heart is enlarged. Thoracic aorta is calcified. Mixed interstitial and airspace opacification is seen in a slightly different distribution than on 12/29/2017. Small right pleural effusion. IMPRESSION: 1. Congestive heart failure. 2.  Aortic atherosclerosis (ICD10-170.0). Electronically Signed   By: Lorin Picket M.D.   On: 12/12/2017 07:54   Dg Chest Portable 1 View  Result Date: 12/24/2017 CLINICAL DATA:  Post CPR from home after choking episode and becoming unresponsive. EXAM: PORTABLE CHEST 1 VIEW COMPARISON:  08/08/2016 FINDINGS: Sternotomy wires unchanged. Endotracheal tube has tip 1 cm above the carina. Enteric tube has side-port over the stomach in the left upper quadrant as tip is not visualized. Lungs are adequately inflated demonstrate patchy hazy prominence of the perihilar regions right worse than left which may be due to interstitial edema although infection is possible. No effusion or pneumothorax. Cardiomediastinal silhouette and remainder the exam is unchanged. IMPRESSION: Patchy hazy perihilar opacification right worse than left likely interstitial edema, although infection is possible. Tubes and lines as described. Note that the endotracheal tube has tip 1 cm above the carina and could be pulled back 2 cm. These results were called by telephone at the time of interpretation on 12/20/2017 at 4:10 pm to patient's nurse, Vicente Males, who verbally acknowledged these results. Electronically Signed   By: Marin Olp M.D.   On: 01/05/2018 16:10    Cardiac Studies   Echo pending  Assessment   Active Problems:   Cardiac arrest [facility])   Demand ischemia (HCC)    Pressure injury of skin   Plan   1. Continue volume management with HD. Appears euvolemic. Echo consistent with chronic systolic HF. ?if we could consider SBT today - opens eyes, but does not follow commands. Await meaningful neuro recovery before considering further testing.   Time Spent Directly with Patient:  I have spent a total of 35 minutes with the patient reviewing hospital notes, telemetry, EKGs, labs and examining the patient as well as establishing an assessment and plan that was discussed personally with the patient.  > 50% of time was spent in direct patient care.  Length of Stay:  LOS: 2 days   Pixie Casino, MD, [facility], [place] Director of the Advanced Lipid Disorders &  Cardiovascular Risk Reduction Clinic Diplomate of the American Board of Clinical Lipidology Attending Cardiologist  Direct Dial: [phone]  Fax: [phone]  Website:  [email or web address] 12/13/2017, 8:26 AM

## 2017-12-13 NOTE — Progress Notes (Addendum)
NAME:  Aaron Long, MRN:  [id number], DOB:  30-Oct-1940, LOS: 2 ADMISSION DATE:  12/28/2017, CONSULTATION DATE:  01/07/2018 REFERRING MD:  Carolynne Edouard [facility] ED, CHIEF COMPLAINT:  Cardiac arrest.   HPI/course in hospital  59 man who is post PEA cardiac arrest due to choking episode. ROSC after 58min. No TTM due to hypotension.   Past Medical History  He,  has a past medical history of Abnormality of gait, Anemia, BPH (benign prostatic hypertrophy), Chronic combined systolic and diastolic CHF (congestive heart failure) ([place]), Complication of anesthesia, Constipation, Coronary artery disease, Diabetic peripheral neuropathy ([place]), Dieulafoy lesion of rectum (05/03/2015), Disorder of bone and cartilage, unspecified, DVT (deep venous thrombosis) ([place]), ESRD on dialysis ([place]), Gangrene of toe ([place]), Hemorrhoids, colonic polyps, Hyperlipidemia, Hypertension, Hypertrophy of prostate without urinary obstruction and other lower urinary tract symptoms (LUTS), Ischemic cardiomyopathy, Kidney stones, Lower limb amputation, below knee, Myocardial infarction ([place]) (2005), Neuromuscular disorder ([place]), NSTEMI (non-ST elevated myocardial infarction) ([place]) (04/2016), PAD (peripheral artery disease) ([place]), PAF (paroxysmal atrial fibrillation) ([place]), Peripheral neuropathy, Prostate cancer ([place]) (2010), Secondary hyperparathyroidism ([place]), and Type II diabetes mellitus ([place]).  Past Surgical History:  Procedure Laterality Date  . ABDOMINAL AORTAGRAM N/A 05/28/2011   Procedure: ABDOMINAL Maxcine Ham;  Surgeon: Elam Dutch, MD;  Location: [facility] CATH LAB;  Service: Cardiovascular;  Laterality: N/A;  . ABDOMINAL AORTAGRAM N/A 06/02/2012   Procedure: ABDOMINAL Maxcine Ham;  Surgeon: Elam Dutch, MD;  Location: [facility] CATH LAB;  Service: Cardiovascular;  Laterality: N/A;  . ABDOMINAL AORTAGRAM N/A 06/09/2012   Procedure: ABDOMINAL Maxcine Ham;  Surgeon: Elam Dutch, MD;  Location: [facility] CATH LAB;  Service: Cardiovascular;   Laterality: N/A;  . AMPUTATION  06/14/2011   Procedure: AMPUTATION DIGIT;  Surgeon: Elam Dutch, MD;  Location: [facility] OR;  Service: Vascular;  Laterality: Left;  Amputation Left fifth toe  . AMPUTATION  06/16/2011   Procedure: AMPUTATION BELOW KNEE;  Surgeon: Elam Dutch, MD;  Location: [place];  Service: Vascular;  Laterality: Left;  . AMPUTATION Right 06/12/2012   Procedure: AMPUTATION DIGIT;  Surgeon: Elam Dutch, MD;  Location: [place];  Service: Vascular;  Laterality: Right;  GREAT TOE  . AMPUTATION Right 08/08/2012   Procedure: AMPUTATION BELOW KNEE;  Surgeon: Elam Dutch, MD;  Location: [place];  Service: Vascular;  Laterality: Right;  . AV FISTULA PLACEMENT Right 04/19/2014   Procedure: RIGHT ARM ARTERIOVENOUS (AV) FISTULA CREATION;  Surgeon: Mal Misty, MD;  Location: [place];  Service: Vascular;  Laterality: Right;  . AV FISTULA PLACEMENT Left 12/12/2014   Procedure: BRACHIOCEPHALIC ARTERIOVENOUS (AV) FISTULA CREATION;  Surgeon: Mal Misty, MD;  Location: [place];  Service: Vascular;  Laterality: Left;  . CARDIAC CATHETERIZATION  05/28/11  . CARDIAC CATHETERIZATION N/A 10/04/2014   Procedure: Left Heart Cath and Coronary Angiography;  Surgeon: Sherren Mocha, MD;  Location: [place] CV LAB;  Service: Cardiovascular;  Laterality: N/A;  . CAROTID ENDARTERECTOMY Right 2005  . CATARACT EXTRACTION W/ INTRAOCULAR LENS  IMPLANT, BILATERAL Bilateral 2011  . COLONOSCOPY    . COLONOSCOPY N/A 05/03/2015   Procedure: COLONOSCOPY;  Surgeon: Gatha Mayer, MD;  Location: [place];  Service: Endoscopy;  Laterality: N/A;  . CORONARY ARTERY BYPASS GRAFT  2005   CABG X4  . ENDARTERECTOMY Left 10/17/2014   Procedure: ENDARTERECTOMY CAROTID;  Surgeon: Mal Misty, MD;  Location: [place];  Service: Vascular;  Laterality: Left;  . I&D EXTREMITY  01/31/2012   Procedure: IRRIGATION AND DEBRIDEMENT EXTREMITY;  Surgeon: Elam Dutch, MD;  Location: [facility] OR;  Service: Vascular;  Laterality: Left;   I & D Left BKA   . INSERTION OF DIALYSIS CATHETER Right 04/19/2014   Procedure: INSERTION OF DIALYSIS CATHETER-INTERNAL JUGULAR;  Surgeon: Mal Misty, MD;  Location: [place];  Service: Vascular;  Laterality: Right;  . INSERTION OF DIALYSIS CATHETER N/A 11/06/2014   Procedure: INSERTION OF 23cm DIALYSIS CATHETER - right internal jugular;  Surgeon: Mal Misty, MD;  Location: [place];  Service: Vascular;  Laterality: N/A;  . KNEE CARTILAGE SURGERY Left 1964  . LEFT HEART CATH AND CORONARY ANGIOGRAPHY N/A 04/12/2016   Procedure: Left Heart Cath and Coronary Angiography;  Surgeon: Nelva Bush, MD;  Location: [place] CV LAB;  Service: Cardiovascular;  Laterality: N/A;  . LIGATION OF ARTERIOVENOUS  FISTULA Right 11/06/2014   Procedure: LIGATION OF RADIOCEPHALIC ARTERIOVENOUS FISTULA;  Surgeon: Mal Misty, MD;  Location: [place];  Service: Vascular;  Laterality: Right;  . LIGATION OF COMPETING BRANCHES OF ARTERIOVENOUS FISTULA Right 06/19/2014   Procedure: Right Arm LIGATION OF COMPETING BRANCHES OF RADIOCEPHALIC ARTERIOVENOUS FISTULA;  Surgeon: Mal Misty, MD;  Location: [place];  Service: Vascular;  Laterality: Right;  . PROSTATECTOMY  2009      Interim history/subjective:  Tolerated HD 12/2. Cardiology not recommending cath. No seizure activity per EEG, more consistent with encephalopathy. Echo shows EF of 20-25%>> await meaningful neuro recovery before considering procedures Plan for volume management with HD Net negative 1100 cc Needs goals of care conversation  Objective   Blood pressure 119/77, pulse 95, temperature 99.1 F (37.3 C), temperature source Oral, resp. rate 18, height 4\' 5"  (1.346 m), weight 72 kg, SpO2 100 %.    Vent Mode: PRVC FiO2 (%):  [40 %] 40 % Set Rate:  [16 bmp] 16 bmp Vt Set:  [550 mL] 550 mL PEEP:  [5 cmH20] 5 cmH20 Plateau Pressure:  [23 cmH20-27 cmH20] 25 cmH20   Intake/Output Summary (Last 24 hours) at 12/13/2017 1223 Last data filed at 12/13/2017  1000 Gross per 24 hour  Intake 165.08 ml  Output 3033 ml  Net -2867.92 ml   Filed Weights   12/12/17 1415 12/12/17 1900 12/13/17 0129  Weight: 76.4 kg 72.7 kg 72 kg    Examination: Physical Exam: General: intubated, unresponsive, on full vent support Skin:Warm and dry, brisk capillary refill HEENT:normocephalic, sclera clear, mucus membranes moist, ETT, OG tube Neck:supple, no JVD, no bruits, no LAD Heart:S1S2 RRR without murmur, gallup, rub or click Lungs:bilateral chest excursion,  without rales,  or wheezes, few rhonchi noted OZH:YQMV, non tender, + BS, do not palpate liver spleen or masses Ext:no lower ext edema, 2+ pedal pulses, 2+ radial pulses Neuro:unresponsive, follows no commands , on no sedation. Opens eyes, no focus or track      Ancillary tests (personally reviewed)  CBC: Recent Labs  Lab 01/05/2018 1539 12/26/2017 1558 12/12/17 0505  WBC 8.1  --  10.5  NEUTROABS 5.1  --   --   HGB 14.3 10.5* 9.1*  HCT 46.6 31.0* 28.2*  MCV 83.8  --  96.9  PLT 330  --  142*    Basic Metabolic Panel: Recent Labs  Lab 12/24/2017 1539 12/24/2017 1553 01/08/2018 1558 12/12/17 0358  NA 138  --  131* 140  K 3.8  --  5.7* 3.0*  CL 102  --  99 94*  CO2 26  --   --  29  GLUCOSE 121*  --  282* 114*  BUN 5*  --  67* 57*  CREATININE 0.84  --  7.80* 9.11*  CALCIUM 9.1  --   --  8.4*  MG  --  2.2  --  1.8  PHOS  --  8.7*  --  2.3*   GFR: Estimated Creatinine Clearance: 4.7 mL/min (A) (by C-G formula based on SCr of 9.11 mg/dL (H)). Recent Labs  Lab 12/31/2017 1539 01/03/2018 1553 12/13/2017 1600 12/12/17 0505  PROCALCITON <0.10 0.10  --   --   WBC 8.1  --   --  10.5  LATICACIDVEN  --   --  5.36*  --     Liver Function Tests: Recent Labs  Lab 12/31/2017 1539  AST 20  ALT 19  ALKPHOS 75  BILITOT 0.2*  PROT 8.1  ALBUMIN 3.6   Recent Labs  Lab 01/02/2018 1553  LIPASE 140*  AMYLASE 134*   No results for input(s): AMMONIA in the last 168 hours.  ABG    Component Value  Date/Time   PHART 7.627 (HH) 12/12/2017 0336   PCO2ART 24.3 (L) 12/12/2017 0336   PO2ART 97.0 12/12/2017 0336   HCO3 25.4 12/12/2017 0336   TCO2 26 12/12/2017 0336   ACIDBASEDEF 8.0 (H) 01/10/2018 1951   O2SAT 99.0 12/12/2017 0336     Coagulation Profile: Recent Labs  Lab 12/18/2017 1539  INR 0.99    Cardiac Enzymes: Recent Labs  Lab 12/28/2017 1539 12/26/2017 2201 12/12/17 0358  TROPONINI <0.03 1.80* 5.83*    HbA1C: Hgb A1c MFr Bld  Date/Time Value Ref Range Status  12/26/2017 03:53 PM 7.6 (H) 4.8 - 5.6 % Final    Comment:    (NOTE) Pre diabetes:          5.7%-6.4% Diabetes:              >6.4% Glycemic control for   <7.0% adults with diabetes   08/09/2017 05:32 AM 7.7 (H) 4.8 - 5.6 % Final    Comment:    (NOTE) Pre diabetes:          5.7%-6.4% Diabetes:              >6.4% Glycemic control for   <7.0% adults with diabetes     CBG: Recent Labs  Lab 12/12/17 1605 12/12/17 2115 12/12/17 2336 12/13/17 0424 12/13/17 0727  GLUCAP 103* 111* 113* 121* 113*   CXR 12/2: increased interstitial markings and basilar airspace disease on the right side consistent with aspiration.   CT head 12/1: atrophy and microvascular changes - nil acute.  Assessment & Plan:  Critically ill due to  Acute Respiratory Failure post cardiac arrest requiring mechanical ventilation for respiratory failure due to inability to protect airway Failed SBT on 12/3 due to  low volumes Does not follow commands No sedation Plan: Wean FiO2 and PEEP as able Ongoing SBT trials   CXR 12/4  Cardiac Post PEA arrest HX of CAD Last cath showed all 4 grafts patent in 04/2016, LVEF between 83-15%, grade 2 diastolic dysfunction Advanced cardiomyopathy with low EF of 20-25% per echo 12/2 Demand Ischemia  Plan: Echo results noted Volume management with HD Cards following Awaiting meaningful neuro recovery before considering any further testing Trend troponins  ESRD Usually MWF HD  schedule Tolerated HD 12/2 Appears euvolemic Hyperkalemia Plan Trend BMET Replete electrolytes as needed Continue HD per renal ( Appreciate assist) Trend I&O balance  Neuro Encephalopathy following cardiac arrest . Hypoxic Ischemic Encephalopathy (HIE) marked unresponsiveness is concerning after short cardiac arrest. Opens eyes,  but no focus/ track/ following commands PLAN EEG negative for seizures HD to clear any potential residual sedative, correct uremia. No sedation,  Prognosis is poor - age and vascular disease further negative prognostic factors. Limited baseline life expectancy with ESRD and CHF. Further goals of care discussion with family: we have agreed to a 48h monitoring period to rule out seizures and dialyze him and reassess exam at that time. Consider Palliative care consult  ID T max 100.2 WBC 10.5 on 12/2 Plan Trend fever curve Follow CXR Culture as is clinically indicated Consider d/c of Right Femoral CVC  GI NPO at present Plan: Advance OG tube Start TF Levemir as ordered  DM Plan CBG Q 4 SSI    Best practice:  Diet: Initiate TF Pain/Anxiety/Delirium protocol (if indicated): no sedatives VAP protocol (if indicated):  bundle DVT prophylaxis: heparin [place] GI prophylaxis: famotidine Glucose control: SSI Mobility: bedrest Code Status: no CPR Family Communication: will discuss prognosis again with family today. Disposition: ICU   Critical care time:      Magdalen Spatz, [facility] Pager # [phone], [phone] 12/13/2017 12:24 PM

## 2017-12-14 ENCOUNTER — Inpatient Hospital Stay (HOSPITAL_COMMUNITY): Payer: Medicare Other

## 2017-12-14 DIAGNOSIS — N186 End stage renal disease: Secondary | ICD-10-CM

## 2017-12-14 DIAGNOSIS — Z992 Dependence on renal dialysis: Secondary | ICD-10-CM

## 2017-12-14 LAB — BASIC METABOLIC PANEL
Anion gap: 24 — ABNORMAL HIGH (ref 5–15)
BUN: 61 mg/dL — ABNORMAL HIGH (ref 8–23)
CO2: 20 mmol/L — ABNORMAL LOW (ref 22–32)
CREATININE: 7.35 mg/dL — AB (ref 0.61–1.24)
Calcium: 8.8 mg/dL — ABNORMAL LOW (ref 8.9–10.3)
Chloride: 95 mmol/L — ABNORMAL LOW (ref 98–111)
GFR calc Af Amer: 8 mL/min — ABNORMAL LOW (ref >60)
GFR calc non Af Amer: 6 mL/min — ABNORMAL LOW (ref >60)
Glucose, Bld: 223 mg/dL — ABNORMAL HIGH (ref 70–99)
Potassium: 5.1 mmol/L (ref 3.5–5.1)
Sodium: 139 mmol/L (ref 135–145)

## 2017-12-14 LAB — GLUCOSE, CAPILLARY
Glucose-Capillary: 211 mg/dL — ABNORMAL HIGH (ref 70–99)
Glucose-Capillary: 259 mg/dL — ABNORMAL HIGH (ref 70–99)
Glucose-Capillary: 253 mg/dL — ABNORMAL HIGH (ref 70–99)
Glucose-Capillary: 214 mg/dL — ABNORMAL HIGH (ref 70–99)
Glucose-Capillary: 204 mg/dL — ABNORMAL HIGH (ref 70–99)

## 2017-12-14 LAB — CBC
HCT: 31.2 % — ABNORMAL LOW (ref 39.0–52.0)
Hemoglobin: 10.2 g/dL — ABNORMAL LOW (ref 13.0–17.0)
MCH: 30.8 pg (ref 26.0–34.0)
MCHC: 32.7 g/dL (ref 30.0–36.0)
MCV: 94.3 fL (ref 80.0–100.0)
Platelets: 144 10*3/uL — ABNORMAL LOW (ref 150–400)
RBC: 3.31 MIL/uL — ABNORMAL LOW (ref 4.22–5.81)
RDW: 15.5 % (ref 11.5–15.5)
WBC: 12.2 10*3/uL — ABNORMAL HIGH (ref 4.0–10.5)
nRBC: 1 % — ABNORMAL HIGH (ref 0.0–0.2)

## 2017-12-14 LAB — MAGNESIUM: Magnesium: 2.2 mg/dL (ref 1.7–2.4)

## 2017-12-14 MED ORDER — INSULIN DETEMIR 100 UNIT/ML ~~LOC~~ SOLN
10.0000 [IU] | Freq: Two times a day (BID) | SUBCUTANEOUS | Status: AC
  Administered 2017-12-14 – 2017-12-15 (×3): 10 [IU] via SUBCUTANEOUS
  Filled 2017-12-14 (×4): qty 0.1

## 2017-12-14 MED ORDER — INSULIN ASPART 100 UNIT/ML ~~LOC~~ SOLN
0.0000 [IU] | SUBCUTANEOUS | Status: DC
  Administered 2017-12-14 (×2): 3 [IU] via SUBCUTANEOUS
  Administered 2017-12-14: 5 [IU] via SUBCUTANEOUS
  Administered 2017-12-15 (×3): 2 [IU] via SUBCUTANEOUS
  Administered 2017-12-15: 3 [IU] via SUBCUTANEOUS
  Administered 2017-12-15: 1 [IU] via SUBCUTANEOUS
  Administered 2017-12-15: 2 [IU] via SUBCUTANEOUS
  Administered 2017-12-16 (×3): 3 [IU] via SUBCUTANEOUS

## 2017-12-14 MED ORDER — INSULIN ASPART 100 UNIT/ML ~~LOC~~ SOLN
5.0000 [IU] | Freq: Once | SUBCUTANEOUS | Status: AC
  Administered 2017-12-14: 5 [IU] via SUBCUTANEOUS

## 2017-12-14 MED ORDER — INSULIN DETEMIR 100 UNIT/ML ~~LOC~~ SOLN
10.0000 [IU] | Freq: Two times a day (BID) | SUBCUTANEOUS | Status: DC
  Administered 2017-12-14: 10 [IU] via SUBCUTANEOUS
  Filled 2017-12-14: qty 0.1

## 2017-12-14 MED ORDER — SODIUM CHLORIDE 0.9 % IV SOLN
INTRAVENOUS | Status: DC | PRN
  Administered 2017-12-14 – 2017-12-15 (×2): 250 mL via INTRAVENOUS

## 2017-12-14 NOTE — Progress Notes (Signed)
Patient transported on vent to MRI and back to 0I-[id number] without complication.

## 2017-12-14 NOTE — Progress Notes (Signed)
Discussed patient's increasing blood sugar values with MD Agarwala, new orders received.

## 2017-12-14 NOTE — Progress Notes (Signed)
Patient ID: Aaron Long, male   DOB: 01/12/1940, 77 y.o.   MRN: [id number]  [facility] Progress Note   Assessment/ Plan:   1.  Status post cardiac arrest: Suffered outside the hospital with 10 minutes of resuscitation required.  Appears hemodynamically stable. 2. ESRD: Usually on a Monday/Wednesday/Friday schedule and he is euvolemic on physical exam.  I will postpone dialysis to later today in case decision is made for withdrawal of care. 3. Anemia: Downtrending hemoglobin noted, no overt loss noted-recheck labs tomorrow morning.  Continue Aranesp. 4. CKD-MBD: Currently n.p.o. while on ventilator, binders on hold. 5.  Altered level of consciousness: From hypoxic encephalopathy following arrest-prognosis guarded.  Subjective:   Since from overnight noted-appears the trajectory is moving towards withdrawal of care.   Objective:   BP 128/78   Pulse (!) 102   Temp 99.9 F (37.7 C) (Oral)   Resp (!) 25   Ht 5' 8" (1.727 m)   Wt 72 kg   SpO2 100%   BMI 24.14 kg/m   Physical Exam: Gen: Appears to be comfortable on ventilator.  No withdrawal to pain. CVS: Pulse regular rhythm, normal rate, S1 and S2 normal Resp: Anteriorly clear to auscultation, no rales/rhonchi Abd: Soft, flat, nontender Ext: Status post bilateral below-knee amputations-no ulcers/wounds  Labs: BMET Recent Labs  Lab 01/06/2018 1539 01/01/2018 1553 01/04/2018 1558 12/12/17 0358 12/13/17 1302 12/14/17 0211  NA 138  --  131* 140 135 139  K 3.8  --  5.7* 3.0* 5.3* 5.1  CL 102  --  99 94* 93* 95*  CO2 26  --   --  29 22 20*  GLUCOSE 121*  --  282* 114* 178* 223*  BUN 5*  --  67* 57* 41* 61*  CREATININE 0.84  --  7.80* 9.11* 6.47* 7.35*  CALCIUM 9.1  --   --  8.4* 8.6* 8.8*  PHOS  --  8.7*  --  2.3*  --   --    CBC Recent Labs  Lab 12/17/2017 1539 01/08/2018 1558 12/12/17 0505 12/13/17 1302 12/14/17 0211  WBC 8.1  --  10.5 10.6* 12.2*  NEUTROABS 5.1  --   --   --   --   HGB 14.3 10.5* 9.1*  10.2* 10.2*  HCT 46.6 31.0* 28.2* 31.9* 31.2*  MCV 83.8  --  96.9 96.7 94.3  PLT 330  --  142* 133* 144*   Medications:    . chlorhexidine gluconate (MEDLINE KIT)  15 mL Mouth Rinse BID  . Chlorhexidine Gluconate Cloth  6 each Topical Q0600  . darbepoetin (ARANESP) injection - DIALYSIS  60 mcg Intravenous Q Wed-HD  . heparin  5,000 Units Subcutaneous Q8H  . insulin aspart  1-3 Units Subcutaneous Q4H  . insulin aspart  5 Units Subcutaneous Once  . insulin detemir  10 Units Subcutaneous Q12H  . mouth rinse  15 mL Mouth Rinse 10 times per day    , MD 12/14/2017, 8:12 AM   

## 2017-12-14 NOTE — Progress Notes (Signed)
NAME:  Aaron Long, MRN:  [id number], DOB:  1940-06-03, LOS: 3 ADMISSION DATE:  01/01/2018, CONSULTATION DATE:  01/04/2018 REFERRING MD:  Carolynne Edouard [facility] ED, CHIEF COMPLAINT:  Cardiac arrest.   Brief Summary  70 man who is post PEA cardiac arrest due to choking episode. ROSC after 53min. No TTM due to hypotension.   Past Medical History  He,  has a past medical history of Abnormality of gait, Anemia, BPH (benign prostatic hypertrophy), Chronic combined systolic and diastolic CHF (congestive heart failure) ([place]), Complication of anesthesia, Constipation, Coronary artery disease, Diabetic peripheral neuropathy ([employer]), Dieulafoy lesion of rectum (05/03/2015), Disorder of bone and cartilage, unspecified, DVT (deep venous thrombosis) ([place]), ESRD on dialysis ([place]), Gangrene of toe ([place]), Hemorrhoids, colonic polyps, Hyperlipidemia, Hypertension, Hypertrophy of prostate without urinary obstruction and other lower urinary tract symptoms (LUTS), Ischemic cardiomyopathy, Kidney stones, Lower limb amputation, below knee, Myocardial infarction ([place]) (2005), Neuromuscular disorder ([place]), NSTEMI (non-ST elevated myocardial infarction) ([place]) (04/2016), PAD (peripheral artery disease) ([place]), PAF (paroxysmal atrial fibrillation) ([place]), Peripheral neuropathy, Prostate cancer ([place]) (2010), Secondary hyperparathyroidism ([place]), and Type II diabetes mellitus ([place]).    Events  12/01  Admit with PEA arrest after choking episode  12/01  Fentanyl 100 mcg total 12/02  Intermittent HD 12/04  No sedating meds since 12/1, not waking on exam, low Vt on wean   Subjective  RN reports increased glucose.  Failed SBT with low lung volumes.    Objective   Blood pressure 123/69, pulse 100, temperature 99.9 F (37.7 C), temperature source Oral, resp. rate 14, height 5\' 8"  (1.727 m), weight 72 kg, SpO2 100 %.    Vent Mode: PRVC FiO2 (%):  [40 %] 40 % Set Rate:  [16 bmp] 16 bmp Vt Set:  [550 mL] 550 mL PEEP:  [5 cmH20] 5  cmH20 Pressure Support:  [15 cmH20] 15 cmH20 Plateau Pressure:  [21 cmH20-25 cmH20] 21 cmH20   Intake/Output Summary (Last 24 hours) at 12/14/2017 1034 Last data filed at 12/14/2017 1000 Gross per 24 hour  Intake 920.73 ml  Output 75 ml  Net 845.73 ml   Filed Weights   12/13/17 0129 12/13/17 1503 12/14/17 0300  Weight: 72 kg 72 kg 72 kg     Physical Exam: General: ill appearing elderly male lying in bed on vent  HEENT: MM pink/moist, ETT, no jvd Neuro: roving eye movement, pupils 65mm, spontaneous respirations on wean but tachypnea/low volumes, no spontaneous movement  CV: s1s2 rrr, no m/r/g PULM: even/non-labored, lungs bilaterally clear  WU:JWJX, non-tender, bsx4 active  Extremities: warm/dry, no edema, bilateral lower extremity amputation, LUE AVF with +thrill/bruit Skin: no rashes or lesions  Ancillary tests (personally reviewed)   CBC: Recent Labs  Lab 01/09/2018 1539 01/01/2018 1558 12/12/17 0505 12/13/17 1302 12/14/17 0211  WBC 8.1  --  10.5 10.6* 12.2*  NEUTROABS 5.1  --   --   --   --   HGB 14.3 10.5* 9.1* 10.2* 10.2*  HCT 46.6 31.0* 28.2* 31.9* 31.2*  MCV 83.8  --  96.9 96.7 94.3  PLT 330  --  142* 133* 144*    Basic Metabolic Panel: Recent Labs  Lab 01/03/2018 1539 12/14/2017 1553 01/07/2018 1558 12/12/17 0358 12/13/17 1302 12/14/17 0211  NA 138  --  131* 140 135 139  K 3.8  --  5.7* 3.0* 5.3* 5.1  CL 102  --  99 94* 93* 95*  CO2 26  --   --  29 22 20*  GLUCOSE 121*  --  282* 114* 178* 223*  BUN 5*  --  67* 57* 41* 61*  CREATININE 0.84  --  7.80* 9.11* 6.47* 7.35*  CALCIUM 9.1  --   --  8.4* 8.6* 8.8*  MG  --  2.2  --  1.8  --  2.2  PHOS  --  8.7*  --  2.3*  --   --    GFR: Estimated Creatinine Clearance: 8.1 mL/min (A) (by C-G formula based on SCr of 7.35 mg/dL (H)). Recent Labs  Lab 12/22/2017 1539 01/03/2018 1553 12/30/2017 1600 12/12/17 0505 12/13/17 1302 12/14/17 0211  PROCALCITON <0.10 0.10  --   --   --   --   WBC 8.1  --   --  10.5 10.6*  12.2*  LATICACIDVEN  --   --  5.36*  --   --   --     Liver Function Tests: Recent Labs  Lab 01/06/2018 1539  AST 20  ALT 19  ALKPHOS 75  BILITOT 0.2*  PROT 8.1  ALBUMIN 3.6   Recent Labs  Lab 12/27/2017 1553  LIPASE 140*  AMYLASE 134*   No results for input(s): AMMONIA in the last 168 hours.  ABG    Component Value Date/Time   PHART 7.627 (HH) 12/12/2017 0336   PCO2ART 24.3 (L) 12/12/2017 0336   PO2ART 97.0 12/12/2017 0336   HCO3 25.4 12/12/2017 0336   TCO2 26 12/12/2017 0336   ACIDBASEDEF 8.0 (H) 12/21/2017 1951   O2SAT 99.0 12/12/2017 0336     Coagulation Profile: Recent Labs  Lab 12/25/2017 1539  INR 0.99    Cardiac Enzymes: Recent Labs  Lab 12/21/2017 1539 01/01/2018 2201 12/12/17 0358  TROPONINI <0.03 1.80* 5.83*    HbA1C: Hgb A1c MFr Bld  Date/Time Value Ref Range Status  12/27/2017 03:53 PM 7.6 (H) 4.8 - 5.6 % Final    Comment:    (NOTE) Pre diabetes:          5.7%-6.4% Diabetes:              >6.4% Glycemic control for   <7.0% adults with diabetes   08/09/2017 05:32 AM 7.7 (H) 4.8 - 5.6 % Final    Comment:    (NOTE) Pre diabetes:          5.7%-6.4% Diabetes:              >6.4% Glycemic control for   <7.0% adults with diabetes     CBG: Recent Labs  Lab 12/13/17 1554 12/13/17 2035 12/13/17 2306 12/14/17 0340 12/14/17 0739  GLUCAP 144* 144* 167* 211* 259*    Assessment & Plan:    Acute Hypoxic Respiratory Failure secondary to PEA Arrest -post PEA arrest after choking on food -failed SBT 12/3, 12/4 due to low lung volumes P: Continue mechanical ventilation support > son arriving from [place] 12/4 and will need to visit  SBT as tolerated  Follow intermittent CXR Discontinue all sedation from [facility]  PEA Arrest  Hx CAD  Cardiomyopathy  -LHC 04/2016 showed all 4 grafts patent  -ECHO 12/2 with LVEF 20-25%, Grade II diastolic dysfunction P: ICU monitoring  No further cardiac intervention unless meaningful neuro recovery    Appreciate Cardiology  ECHO as above  DNR in the event of arrest   ESRD -MWF HD schedule Hyperkalemia P: Trend BMP / urinary output Replace electrolytes as indicated Nephrology following   Acute Encephalopathy - mixed hypoxic ischemic / metabolic  47/4 EEG negative for seizure, non-specific cerebral dysfunction  P: Hold  all sedating medications  Likely poor prognosis for neuro recovery See Palliative care discussion as below   At Risk Malnutrition  P: Continue TF   DM P: SSI Q4, sensitive scale  Levemir 10 units BID    Best practice:  Diet: Initiate TF Pain/Anxiety/Delirium protocol (if indicated): no sedatives VAP protocol (if indicated):  bundle DVT prophylaxis: heparin [place] GI prophylaxis: famotidine Glucose control: SSI Mobility: bedrest Code Status: no CPR Family Communication: Family (wife, daughter, son, cousin) updated on plan of care.  We reviewed he has not received sedation since 12/1 and his neurological exam is poor.  The patients wife indicates he wanted to have life support if needed but she does not want him to suffer.  I reviewed that he has had aggressive care. However, we are concerned he has suffered an anoxic injury to the brain and he may not wake to be the same person as before. Questions answered regarding what the withdrawal of care process would look like.  [name] son is on his way in from [place] and will need time to visit with him.  They do not want to consider trach (also reviewed difficulty of placement with trach / HD issues).  Anticipate family will likely withdrawal aggressive measures in next 24-48 hours.  DNR in the event of cardiac arrest.  Disposition: ICU   Critical care time: 40 minutes     Noe Gens, NP-C [facility] Pgr: [phone] or if no answer [phone] 12/14/2017, 10:34 AM

## 2017-12-15 ENCOUNTER — Inpatient Hospital Stay (HOSPITAL_COMMUNITY): Payer: Medicare Other

## 2017-12-15 DIAGNOSIS — J9601 Acute respiratory failure with hypoxia: Secondary | ICD-10-CM | POA: Diagnosis present

## 2017-12-15 LAB — GLUCOSE, CAPILLARY
Glucose-Capillary: 212 mg/dL — ABNORMAL HIGH (ref 70–99)
Glucose-Capillary: 199 mg/dL — ABNORMAL HIGH (ref 70–99)
Glucose-Capillary: 172 mg/dL — ABNORMAL HIGH (ref 70–99)
Glucose-Capillary: 142 mg/dL — ABNORMAL HIGH (ref 70–99)
Glucose-Capillary: 174 mg/dL — ABNORMAL HIGH (ref 70–99)
Glucose-Capillary: 185 mg/dL — ABNORMAL HIGH (ref 70–99)

## 2017-12-15 LAB — CBC
HCT: 30.7 % — ABNORMAL LOW (ref 39.0–52.0)
Hemoglobin: 10.1 g/dL — ABNORMAL LOW (ref 13.0–17.0)
MCH: 30.9 pg (ref 26.0–34.0)
MCHC: 32.9 g/dL (ref 30.0–36.0)
MCV: 93.9 fL (ref 80.0–100.0)
PLATELETS: 166 10*3/uL (ref 150–400)
RBC: 3.27 MIL/uL — ABNORMAL LOW (ref 4.22–5.81)
RDW: 15.3 % (ref 11.5–15.5)
WBC: 13.7 10*3/uL — ABNORMAL HIGH (ref 4.0–10.5)
nRBC: 4 % — ABNORMAL HIGH (ref 0.0–0.2)

## 2017-12-15 LAB — BASIC METABOLIC PANEL
Anion gap: 24 — ABNORMAL HIGH (ref 5–15)
BUN: 108 mg/dL — ABNORMAL HIGH (ref 8–23)
CO2: 18 mmol/L — ABNORMAL LOW (ref 22–32)
Calcium: 8.8 mg/dL — ABNORMAL LOW (ref 8.9–10.3)
Chloride: 98 mmol/L (ref 98–111)
Creatinine, Ser: 9.35 mg/dL — ABNORMAL HIGH (ref 0.61–1.24)
GFR calc Af Amer: 6 mL/min — ABNORMAL LOW (ref >60)
GFR calc non Af Amer: 5 mL/min — ABNORMAL LOW (ref >60)
Glucose, Bld: 240 mg/dL — ABNORMAL HIGH (ref 70–99)
Potassium: 4.9 mmol/L (ref 3.5–5.1)
Sodium: 140 mmol/L (ref 135–145)

## 2017-12-15 MED ORDER — VITAL AF 1.2 CAL PO LIQD
1500.0000 mL | ORAL | Status: DC
  Filled 2017-12-15: qty 1500

## 2017-12-15 MED ORDER — FENTANYL CITRATE (PF) 100 MCG/2ML IJ SOLN
12.5000 ug | INTRAMUSCULAR | Status: DC | PRN
  Administered 2017-12-15 – 2017-12-16 (×6): 25 ug via INTRAVENOUS
  Filled 2017-12-15 (×6): qty 2

## 2017-12-15 NOTE — Progress Notes (Signed)
Patient ID: Aaron Long, male   DOB: 1940-02-13, 77 y.o.   MRN: [id number]  [facility] Progress Note   Assessment/ Plan:   1.  Status post cardiac arrest: Suffered outside the hospital with 10 minutes of resuscitation required.  With significant hypoxic encephalopathy that appears to be unimproved at this time-MRI further confirms it and this is likely to result in the family making the decision to withdraw care. 2. ESRD: Usually on a Monday/Wednesday/Friday schedule, remains euvolemic on physical exam.  I will order hemodialysis only if the family decides against withdrawal of care.  From my brief interaction with them-they appear to be in favor of withdrawal. 3. Anemia: Stable hemoglobin/hematocrit status post ESA. 4. CKD-MBD: Currently n.p.o. while on ventilator, binders on hold. 5.  Altered level of consciousness: From hypoxic encephalopathy following arrest-prognosis guarded.  Subjective:   MRI from yesterday night shows findings consistent with hypoxic ischemic encephalopathy versus PR ES.   Objective:   BP 105/61   Pulse 90   Temp 100 F (37.8 C) (Oral)   Resp 18   Ht 5' 8" (1.727 m)   Wt 72.3 kg   SpO2 100%   BMI 24.24 kg/m   Physical Exam: Gen: Appears to be comfortable on ventilator.  Son and wife at bedside. CVS: Pulse regular rhythm, normal rate, S1 and S2 normal Resp: Anteriorly clear to auscultation, no rales/rhonchi Abd: Soft, flat, nontender Ext: Status post bilateral below-knee amputations-no ulcers/wounds  Labs: BMET Recent Labs  Lab 12/30/2017 1539 12/14/2017 1553 12/27/2017 1558 12/12/17 0358 12/13/17 1302 12/14/17 0211 12/15/17 0414  NA 138  --  131* 140 135 139 140  K 3.8  --  5.7* 3.0* 5.3* 5.1 4.9  CL 102  --  99 94* 93* 95* 98  CO2 26  --   --  29 22 20* 18*  GLUCOSE 121*  --  282* 114* 178* 223* 240*  BUN 5*  --  67* 57* 41* 61* 108*  CREATININE 0.84  --  7.80* 9.11* 6.47* 7.35* 9.35*  CALCIUM 9.1  --   --  8.4* 8.6* 8.8*  8.8*  PHOS  --  8.7*  --  2.3*  --   --   --    CBC Recent Labs  Lab 01/07/2018 1539  12/12/17 0505 12/13/17 1302 12/14/17 0211 12/15/17 0414  WBC 8.1  --  10.5 10.6* 12.2* 13.7*  NEUTROABS 5.1  --   --   --   --   --   HGB 14.3   < > 9.1* 10.2* 10.2* 10.1*  HCT 46.6   < > 28.2* 31.9* 31.2* 30.7*  MCV 83.8  --  96.9 96.7 94.3 93.9  PLT 330  --  142* 133* 144* 166   < > = values in this interval not displayed.   Medications:    . chlorhexidine gluconate (MEDLINE KIT)  15 mL Mouth Rinse BID  . Chlorhexidine Gluconate Cloth  6 each Topical Q0600  . darbepoetin (ARANESP) injection - DIALYSIS  60 mcg Intravenous Q Wed-HD  . heparin  5,000 Units Subcutaneous Q8H  . insulin aspart  0-9 Units Subcutaneous Q4H  . insulin detemir  10 Units Subcutaneous BID  . mouth rinse  15 mL Mouth Rinse 10 times per day   Elmarie Shiley, MD 12/15/2017, 8:04 AM

## 2017-12-15 NOTE — Progress Notes (Signed)
ELink notified of patient tachypnea. Breathing over ventilator, accessory muscle use, respiratory rate mid to upper 20's. Awaiting further orders.

## 2017-12-15 NOTE — Progress Notes (Signed)
Family requested AD materials.  I shared this is for patient to express wishes if able to do so.  If patient is not able , the next of kin is responsible for these decisions. Conard Novak, Chaplain   12/15/17 1200  Clinical Encounter Type  Visited With Patient and family together  Visit Type Initial;Other (Comment) (AD Requested info)  Referral From Nurse  Consult/Referral To Chaplain  Spiritual Encounters  Spiritual Needs Literature  Stress Factors  Patient Stress Factors None identified  Family Stress Factors Not reviewed

## 2017-12-15 NOTE — Progress Notes (Signed)
NAME:  Aaron Long, MRN:  [id number], DOB:  1940-08-14, LOS: 4 ADMISSION DATE:  01/08/2018, CONSULTATION DATE:  12/14/2017 REFERRING MD:  Carolynne Edouard [facility] ED, CHIEF COMPLAINT:  Cardiac arrest.   Brief Summary  79 man who is post PEA cardiac arrest due to choking episode. ROSC after 44min. No TTM due to hypotension.   Past Medical History  He,  has a past medical history of Abnormality of gait, Anemia, BPH (benign prostatic hypertrophy), Chronic combined systolic and diastolic CHF (congestive heart failure) ([place]), Complication of anesthesia, Constipation, Coronary artery disease, Diabetic peripheral neuropathy ([place]), Dieulafoy lesion of rectum (05/03/2015), Disorder of bone and cartilage, unspecified, DVT (deep venous thrombosis) ([place]), ESRD on dialysis ([place]), Gangrene of toe ([place]), Hemorrhoids, colonic polyps, Hyperlipidemia, Hypertension, Hypertrophy of prostate without urinary obstruction and other lower urinary tract symptoms (LUTS), Ischemic cardiomyopathy, Kidney stones, Lower limb amputation, below knee, Myocardial infarction ([place]) (2005), Neuromuscular disorder ([place]), NSTEMI (non-ST elevated myocardial infarction) ([place]) (04/2016), PAD (peripheral artery disease) ([place]), PAF (paroxysmal atrial fibrillation) ([place]), Peripheral neuropathy, Prostate cancer ([place]) (2010), Secondary hyperparathyroidism ([place]), and Type II diabetes mellitus ([place]).    Events  12/01  Admit with PEA arrest after choking episode  12/01  Fentanyl 100 mcg total 12/02  Intermittent HD 12/04  No sedating meds since 12/1, not waking on exam, low Vt on wean   Subjective  RN reports family at bedside. No acute events overnight.   Objective   Blood pressure 113/67, pulse 92, temperature 98.5 F (36.9 C), temperature source Oral, resp. rate 18, height 5\' 8"  (1.727 m), weight 72.3 kg, SpO2 100 %.    Vent Mode: PRVC FiO2 (%):  [40 %] 40 % Set Rate:  [16 bmp] 16 bmp Vt Set:  [550 mL] 550 mL PEEP:  [5 cmH20] 5  cmH20 Plateau Pressure:  [18 cmH20-23 cmH20] 18 cmH20   Intake/Output Summary (Last 24 hours) at 12/15/2017 1230 Last data filed at 12/15/2017 1200 Gross per 24 hour  Intake 1379.74 ml  Output 1 ml  Net 1378.74 ml   Filed Weights   12/13/17 1503 12/14/17 0300 12/15/17 0500  Weight: 72 kg 72 kg 72.3 kg   Physical Exam: General: elderly male lying in bed on vent  HEENT: MM pink/moist, scleral edema Neuro: Pupils 102mm =/R, occasional eye opening, no follow commands, +cough/gag, no purposeful movement CV: s1s2 rrr, no m/r/g PULM: even/non-labored, lungs bilaterally clear, PSV 5/5 pulls Vt 140's, tachypnea  WE:XHBZ, non-tender, bsx4 active  Extremities: warm/dry, no edema, bilateral LE amputation Skin: no rashes or lesions  Ancillary tests (personally reviewed)   CBC: Recent Labs  Lab 01/01/2018 1539 01/10/2018 1558 12/12/17 0505 12/13/17 1302 12/14/17 0211 12/15/17 0414  WBC 8.1  --  10.5 10.6* 12.2* 13.7*  NEUTROABS 5.1  --   --   --   --   --   HGB 14.3 10.5* 9.1* 10.2* 10.2* 10.1*  HCT 46.6 31.0* 28.2* 31.9* 31.2* 30.7*  MCV 83.8  --  96.9 96.7 94.3 93.9  PLT 330  --  142* 133* 144* 169    Basic Metabolic Panel: Recent Labs  Lab 12/27/2017 1539 12/30/2017 1553 12/26/2017 1558 12/12/17 0358 12/13/17 1302 12/14/17 0211 12/15/17 0414  NA 138  --  131* 140 135 139 140  K 3.8  --  5.7* 3.0* 5.3* 5.1 4.9  CL 102  --  99 94* 93* 95* 98  CO2 26  --   --  29 22 20* 18*  GLUCOSE 121*  --  282* 114* 178* 223* 240*  BUN 5*  --  67* 57* 41* 61* 108*  CREATININE 0.84  --  7.80* 9.11* 6.47* 7.35* 9.35*  CALCIUM 9.1  --   --  8.4* 8.6* 8.8* 8.8*  MG  --  2.2  --  1.8  --  2.2  --   PHOS  --  8.7*  --  2.3*  --   --   --    GFR: Estimated Creatinine Clearance: 6.4 mL/min (A) (by C-G formula based on SCr of 9.35 mg/dL (H)). Recent Labs  Lab 01/06/2018 1539 12/18/2017 1553 01/05/2018 1600 12/12/17 0505 12/13/17 1302 12/14/17 0211 12/15/17 0414  PROCALCITON <0.10 0.10  --   --   --    --   --   WBC 8.1  --   --  10.5 10.6* 12.2* 13.7*  LATICACIDVEN  --   --  5.36*  --   --   --   --     Liver Function Tests: Recent Labs  Lab 01/03/2018 1539  AST 20  ALT 19  ALKPHOS 75  BILITOT 0.2*  PROT 8.1  ALBUMIN 3.6   Recent Labs  Lab 12/31/2017 1553  LIPASE 140*  AMYLASE 134*   No results for input(s): AMMONIA in the last 168 hours.  ABG    Component Value Date/Time   PHART 7.627 (HH) 12/12/2017 0336   PCO2ART 24.3 (L) 12/12/2017 0336   PO2ART 97.0 12/12/2017 0336   HCO3 25.4 12/12/2017 0336   TCO2 26 12/12/2017 0336   ACIDBASEDEF 8.0 (H) 12/15/2017 1951   O2SAT 99.0 12/12/2017 0336     Coagulation Profile: Recent Labs  Lab 12/18/2017 1539  INR 0.99    Cardiac Enzymes: Recent Labs  Lab 12/25/2017 1539 01/07/2018 2201 12/12/17 0358  TROPONINI <0.03 1.80* 5.83*    HbA1C: Hgb A1c MFr Bld  Date/Time Value Ref Range Status  01/02/2018 03:53 PM 7.6 (H) 4.8 - 5.6 % Final    Comment:    (NOTE) Pre diabetes:          5.7%-6.4% Diabetes:              >6.4% Glycemic control for   <7.0% adults with diabetes   08/09/2017 05:32 AM 7.7 (H) 4.8 - 5.6 % Final    Comment:    (NOTE) Pre diabetes:          5.7%-6.4% Diabetes:              >6.4% Glycemic control for   <7.0% adults with diabetes     CBG: Recent Labs  Lab 12/14/17 2010 12/15/17 0057 12/15/17 0347 12/15/17 0826 12/15/17 1132  GLUCAP 204* 212* 199* 172* 142*    Assessment & Plan:    Acute Hypoxic Respiratory Failure secondary to PEA Arrest -post PEA arrest after choking on food -failed SBT 12/3, 12/4 due to low lung volumes P: Continue support overnight, no escalation of care Plan for palliative withdrawal of therapy in am at 10  PEA Arrest  Hx CAD  Cardiomyopathy  -LHC 04/2016 showed all 4 grafts patent  -ECHO 12/2 with LVEF 20-25%, Grade II diastolic dysfunction P: ICU monitoring  No further escalation of care, supportive measures until am.  Family understands that the  possibility of him passing does exist even on support  DNR in the event of arrest   ESRD -MWF HD schedule Hyperkalemia P: Nephrology following Hold further HD    Acute Encephalopathy - mixed hypoxic ischemic / metabolic  16/1  EEG negative for seizure, non-specific cerebral dysfunction  P: Hold all sedating meds for now  May need PRN sedation for pain / SOB once off vent   At Risk Malnutrition  P: TF overnight, stop in am   DM P: SSI Q4  Hold further levemir after evening dosing     Best practice:  Diet: Initiate TF Pain/Anxiety/Delirium protocol (if indicated): no sedatives VAP protocol (if indicated):  bundle DVT prophylaxis: heparin [place] GI prophylaxis: famotidine Glucose control: SSI Mobility: bedrest Code Status: no CPR Family Communication: Family updated at length.  MRI concerning for anoxic injury.  Plan for withdrawal of care in am 12/6 at 1000 Disposition: ICU   Critical care time: 40 minutes     Noe Gens, NP-C [facility] Pgr: [phone] or if no answer [phone] 12/15/2017, 12:30 PM

## 2017-12-15 NOTE — Progress Notes (Signed)
[facility] Progress Note Patient Name: Aaron Long DOB: [birth year] MRN: [id number]   Date of Service  12/15/2017  HPI/Events of Note  Post-cardiac arrest anoxic brain injury comatose on the ventilator, patient is tachypnic and appears generally uncomfortable  eICU Interventions  Fentanyl 12.5 - 25 mcg Q 2 hours prn discomfort        [name] 12/15/2017, 12:43 AM

## 2017-12-16 LAB — GLUCOSE, CAPILLARY
GLUCOSE-CAPILLARY: 234 mg/dL — AB (ref 70–99)
Glucose-Capillary: 212 mg/dL — ABNORMAL HIGH (ref 70–99)
Glucose-Capillary: 219 mg/dL — ABNORMAL HIGH (ref 70–99)

## 2017-12-16 MED ORDER — MORPHINE BOLUS VIA INFUSION
1.0000 mg | INTRAVENOUS | Status: DC | PRN
  Filled 2017-12-16: qty 2

## 2017-12-16 MED ORDER — MORPHINE 100MG IN NS 100ML (1MG/ML) PREMIX INFUSION
1.0000 mg/h | INTRAVENOUS | Status: DC
  Administered 2017-12-16: 1 mg/h via INTRAVENOUS
  Filled 2017-12-16: qty 100

## 2017-12-20 ENCOUNTER — Telehealth: Payer: Self-pay | Admitting: Emergency Medicine

## 2017-12-20 NOTE — Telephone Encounter (Signed)
12/20/17 Received [place]/DC from [facility] [place] for [place] at 37M to sign.

## 2017-12-21 ENCOUNTER — Telehealth: Payer: Self-pay | Admitting: Emergency Medicine

## 2017-12-21 NOTE — Telephone Encounter (Signed)
12/21/17 [facility] called to pick up signed Original DC by Dr. Lamonte Sakai

## 2018-01-11 NOTE — Progress Notes (Signed)
   01/14/2018 1000  Clinical Encounter Type  Visited With Patient and family together  Visit Type Initial  Referral From Nurse  Consult/Referral To Chaplain  Spiritual Encounters  Spiritual Needs Prayer;Emotional;Grief support  Stress Factors  Patient Stress Factors Exhausted  Family Stress Factors Health changes;Major life changes   Responded to page. Nurse stated that family was requesting Chaplain presence during this time as they were about to offer PT his wishes. Family was thankful for the Chaplain visit.  I offered spiritual care with words of encouragement, a listening ear, ministry of presence and prayer. Chaplain available as needed.  Chaplain Fidel Levy  [phone]

## 2018-01-11 NOTE — Progress Notes (Signed)
Plans for extubation today noted - cards will sign-off.  Pixie Casino, MD, [facility], [place] Director of the Advanced Lipid Disorders &  Cardiovascular Risk Reduction Clinic Diplomate of the American Board of Clinical Lipidology Attending Cardiologist  Direct Dial: [phone]  Fax: [phone]  Website:  [email or web address]

## 2018-01-11 NOTE — Progress Notes (Signed)
Patient ID: Aaron Long, male   DOB: 28-Jan-1940, 78 y.o.   MRN: [id number]  [facility] Progress Note   Assessment/ Plan:   1.  Status post cardiac arrest: Suffered outside the hospital with 10 minutes of resuscitation required.  With significant anoxic brain injury and plans for terminal extubation today. 2. ESRD: Previously on a Monday/Wednesday/Friday schedule while on dialysis.  Now with plans for terminal extubation. 3. Anemia: Stable hemoglobin/hematocrit status post ESA. 4. CKD-MBD: Currently n.p.o. while on ventilator, binders on hold. 5.  Altered level of consciousness: Secondary to hypoxic/anoxic brain injury.  Subjective:   His family has made the difficult decision of terminal extubation today at about 10 AM.   Objective:   BP 107/64   Pulse 83   Temp 99.7 F (37.6 C) (Oral)   Resp 15   Ht 5' 8"  (1.727 m)   Wt 72.5 kg   SpO2 100%   BMI 24.30 kg/m   Physical Exam: Gen: Comfortable on ventilator, unresponsive. CVS: Pulse regular rhythm, normal rate, S1 and S2 normal Resp: Anteriorly clear to auscultation, no rales/rhonchi Abd: Soft, flat, nontender Ext: Status post bilateral below-knee amputations-no ulcers/wounds  Labs: BMET Recent Labs  Lab 01/09/2018 1539 12/17/2017 1553 12/13/2017 1558 12/12/17 0358 12/13/17 1302 12/14/17 0211 12/15/17 0414  NA 138  --  131* 140 135 139 140  K 3.8  --  5.7* 3.0* 5.3* 5.1 4.9  CL 102  --  99 94* 93* 95* 98  CO2 26  --   --  29 22 20* 18*  GLUCOSE 121*  --  282* 114* 178* 223* 240*  BUN 5*  --  67* 57* 41* 61* 108*  CREATININE 0.84  --  7.80* 9.11* 6.47* 7.35* 9.35*  CALCIUM 9.1  --   --  8.4* 8.6* 8.8* 8.8*  PHOS  --  8.7*  --  2.3*  --   --   --    CBC Recent Labs  Lab 12/23/2017 1539  12/12/17 0505 12/13/17 1302 12/14/17 0211 12/15/17 0414  WBC 8.1  --  10.5 10.6* 12.2* 13.7*  NEUTROABS 5.1  --   --   --   --   --   HGB 14.3   < > 9.1* 10.2* 10.2* 10.1*  HCT 46.6   < > 28.2* 31.9* 31.2* 30.7*   MCV 83.8  --  96.9 96.7 94.3 93.9  PLT 330  --  142* 133* 144* 166   < > = values in this interval not displayed.   Medications:    . chlorhexidine gluconate (MEDLINE KIT)  15 mL Mouth Rinse BID  . Chlorhexidine Gluconate Cloth  6 each Topical Q0600  . darbepoetin (ARANESP) injection - DIALYSIS  60 mcg Intravenous Q Wed-HD  . heparin  5,000 Units Subcutaneous Q8H  . insulin aspart  0-9 Units Subcutaneous Q4H  . mouth rinse  15 mL Mouth Rinse 10 times per day   Elmarie Shiley, MD 03-Jan-2018, 7:56 AM

## 2018-01-11 NOTE — Progress Notes (Signed)
60cc of Morphine wasted in sink with Hurshel Party RN.

## 2018-01-11 NOTE — Progress Notes (Addendum)
NAME:  Aaron Long, MRN:  [id number], DOB:  12-Jan-1941, LOS: 5 ADMISSION DATE:  12/20/2017, CONSULTATION DATE:  01/09/2018 REFERRING MD:  Carolynne Edouard [facility] ED, CHIEF COMPLAINT:  Cardiac arrest.   Brief Summary  5 man who is post PEA cardiac arrest due to choking episode. ROSC after 27min. No TTM due to hypotension.   Past Medical History  He,  has a past medical history of Abnormality of gait, Anemia, BPH (benign prostatic hypertrophy), Chronic combined systolic and diastolic CHF (congestive heart failure) ([place]), Complication of anesthesia, Constipation, Coronary artery disease, Diabetic peripheral neuropathy ([place]), Dieulafoy lesion of rectum (05/03/2015), Disorder of bone and cartilage, unspecified, DVT (deep venous thrombosis) ([place]), ESRD on dialysis ([place]), Gangrene of toe ([place]), Hemorrhoids, colonic polyps, Hyperlipidemia, Hypertension, Hypertrophy of prostate without urinary obstruction and other lower urinary tract symptoms (LUTS), Ischemic cardiomyopathy, Kidney stones, Lower limb amputation, below knee, Myocardial infarction ([place]) (2005), Neuromuscular disorder ([place]), NSTEMI (non-ST elevated myocardial infarction) ([place]) (04/2016), PAD (peripheral artery disease) ([place]), PAF (paroxysmal atrial fibrillation) ([place]), Peripheral neuropathy, Prostate cancer ([place]) (2010), Secondary hyperparathyroidism ([place]), and Type II diabetes mellitus ([place]).    Events  12/01  Admit with PEA arrest after choking episode  12/01  Fentanyl 100 mcg total 12/02  Intermittent HD 12/04  No sedating meds since 12/1, not waking on exam, low Vt on wean 12/06 Plan is for palliative withdrawal  at 10 am today   Subjective   No acute events overnight. Exam remains unchanged.Pt. Is unresponsive off sedation. Spoke with wife at bedside, family are planning on 10 am palliative extubation,   Objective   Blood pressure 107/64, pulse 85, temperature 99.7 F (37.6 C), temperature source Oral, resp. rate 16, height 5\' 8"   (1.727 m), weight 72.5 kg, SpO2 100 %.    Vent Mode: PRVC FiO2 (%):  [40 %] 40 % Set Rate:  [16 bmp] 16 bmp Vt Set:  [550 mL] 550 mL PEEP:  [5 cmH20] 5 cmH20 Plateau Pressure:  [18 cmH20-24 cmH20] 22 cmH20   Intake/Output Summary (Last 24 hours) at 01-08-18 0825 Last data filed at 2018-01-08 0700 Gross per 24 hour  Intake 1887.86 ml  Output -  Net 1887.86 ml   Filed Weights   12/14/17 0300 12/15/17 0500 08-Jan-2018 0123  Weight: 72 kg 72.3 kg 72.5 kg   Physical Exam: General: elderly male lying in bed on vent , full support, unresponsive HEENT: NCAT, MM pink/moist, scleral edema Neuro: Pupils 2-45mm =/R, occasional eye opening per nursing staff, no follow commands, +cough/gag, no purposeful movement CV: s1s2 rrr, no m/r/g PULM: Bilateral chest excursion,  even/non-labored, lungs bilaterally clear with afew rhionchi, Full support , in NAD on vent. JT:TSVX, non-tender, bsx4 active  Extremities: warm/dry, no edema, bilateral LE amputation Skin: no rashes or lesions  Ancillary tests (personally reviewed)   CBC: Recent Labs  Lab 01/02/2018 1539 12/28/2017 1558 12/12/17 0505 12/13/17 1302 12/14/17 0211 12/15/17 0414  WBC 8.1  --  10.5 10.6* 12.2* 13.7*  NEUTROABS 5.1  --   --   --   --   --   HGB 14.3 10.5* 9.1* 10.2* 10.2* 10.1*  HCT 46.6 31.0* 28.2* 31.9* 31.2* 30.7*  MCV 83.8  --  96.9 96.7 94.3 93.9  PLT 330  --  142* 133* 144* 793    Basic Metabolic Panel: Recent Labs  Lab 12/19/2017 1539 12/20/2017 1553 12/15/2017 1558 12/12/17 0358 12/13/17 1302 12/14/17 0211 12/15/17 0414  NA 138  --  131* 140 135 139 140  K 3.8  --  5.7* 3.0* 5.3* 5.1 4.9  CL 102  --  99 94* 93* 95* 98  CO2 26  --   --  29 22 20* 18*  GLUCOSE 121*  --  282* 114* 178* 223* 240*  BUN 5*  --  67* 57* 41* 61* 108*  CREATININE 0.84  --  7.80* 9.11* 6.47* 7.35* 9.35*  CALCIUM 9.1  --   --  8.4* 8.6* 8.8* 8.8*  MG  --  2.2  --  1.8  --  2.2  --   PHOS  --  8.7*  --  2.3*  --   --   --     GFR: Estimated Creatinine Clearance: 6.4 mL/min (A) (by C-G formula based on SCr of 9.35 mg/dL (H)). Recent Labs  Lab 12/22/2017 1539 01/09/2018 1553 12/13/2017 1600 12/12/17 0505 12/13/17 1302 12/14/17 0211 12/15/17 0414  PROCALCITON <0.10 0.10  --   --   --   --   --   WBC 8.1  --   --  10.5 10.6* 12.2* 13.7*  LATICACIDVEN  --   --  5.36*  --   --   --   --     Liver Function Tests: Recent Labs  Lab 01/09/2018 1539  AST 20  ALT 19  ALKPHOS 75  BILITOT 0.2*  PROT 8.1  ALBUMIN 3.6   Recent Labs  Lab 12/23/2017 1553  LIPASE 140*  AMYLASE 134*   No results for input(s): AMMONIA in the last 168 hours.  ABG    Component Value Date/Time   PHART 7.627 (HH) 12/12/2017 0336   PCO2ART 24.3 (L) 12/12/2017 0336   PO2ART 97.0 12/12/2017 0336   HCO3 25.4 12/12/2017 0336   TCO2 26 12/12/2017 0336   ACIDBASEDEF 8.0 (H) 01/02/2018 1951   O2SAT 99.0 12/12/2017 0336     Coagulation Profile: Recent Labs  Lab 12/30/2017 1539  INR 0.99    Cardiac Enzymes: Recent Labs  Lab 12/30/2017 1539 12/20/2017 2201 12/12/17 0358  TROPONINI <0.03 1.80* 5.83*    HbA1C: Hgb A1c MFr Bld  Date/Time Value Ref Range Status  12/31/2017 03:53 PM 7.6 (H) 4.8 - 5.6 % Final    Comment:    (NOTE) Pre diabetes:          5.7%-6.4% Diabetes:              >6.4% Glycemic control for   <7.0% adults with diabetes   08/09/2017 05:32 AM 7.7 (H) 4.8 - 5.6 % Final    Comment:    (NOTE) Pre diabetes:          5.7%-6.4% Diabetes:              >6.4% Glycemic control for   <7.0% adults with diabetes     CBG: Recent Labs  Lab 12/15/17 1132 12/15/17 1530 12/15/17 1943 2017/12/26 0001 2017-12-26 0404  GLUCAP 142* 174* 185* 234* 212*    Assessment & Plan:    Acute Hypoxic Respiratory Failure secondary to PEA Arrest -post PEA arrest after choking on food -failed SBT 12/3, 12/4 due to low lung volumes P: Continue support overnight, no escalation of care Plan for palliative withdrawal of therapy  in am at 10 Will start Morphine gtt at 1-2 mg per hour 30 minutes prior to extubation to ensure comfort  PEA Arrest  Hx CAD  Cardiomyopathy  -LHC 04/2016 showed all 4 grafts patent  -ECHO 12/2 with LVEF 20-25%, Grade II diastolic dysfunction P: ICU monitoring  No further escalation of care, supportive measures until am.  Family understands that the possibility of him passing does exist even on support  DNR in the event of arrest   ESRD -MWF HD schedule Hyperkalemia P: Nephrology following Hold further HD  As plan is for palliative withdrawal  Acute Encephalopathy - mixed hypoxic ischemic / metabolic  10/3 EEG negative for seizure, non-specific cerebral dysfunction  P: Hold all sedating meds for now  Will  need sedation for pain / SOB to ensure comfort during withdrawal  At Risk Malnutrition  P: TF overnight, stop in am   DM P: SSI Q4  Hold further levemir after evening dosing     Best practice:  Diet: Initiate TF>> will stop this am to prepare for extubation Pain/Anxiety/Delirium protocol (if indicated): no sedatives VAP protocol (if indicated):  bundle DVT prophylaxis: heparin [place] GI prophylaxis: famotidine Glucose control: SSI Mobility: bedrest Code Status: no CPR Family Communication: Discussed with wife the plan for withdrawal. I explained that we will start a morphine gtt to ensure comfort. She understands that   MRI was concerning for anoxic injury, and that chance of meaningful recovery is poor. I have offered her chaplain at the time. She will let nursing know if she would like the chaplain here at the time..  Plan for withdrawal of care in am 12/6 at 1000 Disposition: ICU   Critical care time:     Magdalen Spatz, AGACNP-BC [place] Pgr: [phone] January 05, 2018, 8:25 AM

## 2018-01-11 NOTE — Progress Notes (Signed)
Chaplain paged to bedside for family support.

## 2018-01-11 NOTE — Procedures (Signed)
Extubation Procedure Note  Patient Details:   Name: TAMARA KENYON DOB: [birth year] MRN: [id number]   Airway Documentation:    Vent end date: 2017/12/28 Vent end time: 1034   Evaluation  O2 sats: stable throughout Complications: No apparent complications Patient did tolerate procedure well. Bilateral Breath Sounds: Clear, Diminished   Yes   Patient was terminally extubated to room air with family and  RN at the bedside.     Dannell Raczkowski, [place] 12/28/2017, 10:34 AM

## 2018-01-11 DEATH — deceased

## 2018-01-13 DIAGNOSIS — R0989 Other specified symptoms and signs involving the circulatory and respiratory systems: Secondary | ICD-10-CM | POA: Diagnosis present

## 2018-02-11 NOTE — Death Summary Note (Signed)
DEATH SUMMARY   Patient Details  Name: Aaron Long MRN: [id number] DOB: 1940/04/18  Admission/Discharge Information   Admit Date:  2017/12/12  Date of Death: Date of Death: 12-17-2017  Time of Death: Time of Death: 1136-04-06  Length of Stay: 5  Referring Physician: Bartholome Bill, MD   Reason(s) for Hospitalization  PEA cardiac arrest  Diagnoses  Preliminary cause of death:   Anoxic encephalopathy  Secondary Diagnoses (including complications and co-morbidities):  Principal Problem:   Cardiac arrest [facility]) Active Problems:   Hypoxic ischemic encephalopathy   Acute respiratory failure with hypoxia ([place])   Choking episode   HTN (hypertension)   CAD (coronary artery disease)   Diabetes mellitus with peripheral vascular disease ([place])   Acute on chronic systolic and diastolic heart failure, NYHA class 4 ([place])   Demand ischemia ([place])   ESRD on hemodialysis ([place])   PAD (peripheral artery disease) ([place])   Prostate cancer ([place])   Hyperkalemia   Anemia in chronic renal disease   GERD (gastroesophageal reflux disease)   Hyperlipidemia LDL goal <70   Status post bilateral below knee amputation ([facility])   Chronic combined systolic and diastolic CHF, NYHA class 4 ([place])   Pressure injury of skin   Brief Hospital Course (including significant findings, care, treatment, and services provided and events leading to death)  Aaron Long is a 78 y.o. year old male with end-stage renal disease on dialysis, diabetes, hypertension, coronary disease with systolic and diastolic CHF. He suffered a witnessed choking event that led to respiratory and then cardiopulmonary arrest while at home on 12/12/17.  He underwent CPR for approximately 8 minutes, was mechanically ventilated.  At the time of admission he was in refractory cardiogenic shock, supported with epinephrine drip.  Therefore therapeutic cooling was deferred.  Course complicated by elevated troponin consistent with a stress non-ST  elevation MI, demand ischemia.  He was supported with mechanical ventilation, pressors, intermittent hemodialysis to stabilize his metabolic disarray, acidosis.  Sedation was minimized but unfortunately he remained comatose, consistent with a severe anoxic brain injury.  Initial head CT 2022/12/13 did not show any acute findings.  An MRI brain on 12/4 showed symmetric cerebral diffusion defect consistent with global hypoxemic ischemic injury.  Based on his neurological status, MRI findings, overall goals for his care, discussions were undertaken with the family regarding transition to comfort.  They were in agreement and a planned extubation with palliation was performed on Dec 17, 2017.  He expired later that day with family bedside.`    Pertinent Labs and Studies  Significant Diagnostic Studies Mr Brain Wo Contrast  Result Date: 12/14/2017 CLINICAL DATA:  Status post CPR 2017/12/12. History of end-stage renal disease on dialysis, hypertension, diabetes, hyperlipidemia. EXAM: MRI HEAD WITHOUT CONTRAST TECHNIQUE: Multiplanar, multiecho pulse sequences of the brain and surrounding structures were obtained without intravenous contrast. COMPARISON:  CT HEAD December 12, 2017 and MRI of the head September 30, 2014. FINDINGS: INTRACRANIAL CONTENTS: Multifocal symmetric cortical cerebrum reduced diffusion with symmetric thalami reduced diffusion, relatively normalized ADC values and bright FLAIR signal. Numerous supra and infratentorial chronic microhemorrhages. No midline shift, mass effect or masses. Moderate to severe parenchymal brain volume loss. No hydrocephalus. Patchy supratentorial white matter FLAIR T2 hyperintensities compatible with mild-to-moderate chronic small vessel ischemic changes. No abnormal extra-axial fluid collections. VASCULAR: Normal major intracranial vascular flow voids present at skull base. SKULL AND UPPER CERVICAL SPINE: No abnormal sellar expansion. No suspicious calvarial bone marrow  signal. Generally bright T1 bone marrow  signal seen with osteopenia. Craniocervical junction maintained. SINUSES/ORBITS: The mastoid air-cells and included paranasal sinuses are well-aerated.The included ocular globes and orbital contents are non-suspicious. Prominent orbital fat associated with metabolic or autoimmune syndromes. OTHER: Life support lines in place. IMPRESSION: 1. Symmetric cerebral reduced diffusion seen with hypoxic ischemic encephalopathy, less likely PRES. 2. Moderate to severe parenchymal brain volume loss. 3. Chronic microhemorrhages in a distribution seen with hypertension, less likely amyloid angiopathy. 4. These results will be called to the ordering clinician or representative by the professional radiologist assistant, and communication documented in [employer]board. Electronically Signed   By: Elon Alas M.D.   On: 12/14/2017 22:16   Dg Chest Port 1 View  Result Date: 12/15/2017 CLINICAL DATA:  Intubation. EXAM: PORTABLE CHEST 1 VIEW COMPARISON:  12/14/2017. FINDINGS: Endotracheal tube, NG tube in stable position. Prior CABG. Cardiomegaly. Diffuse bilateral pulmonary interstitial prominence again noted. Interim improvement from prior exam. Findings suggest improving CHF. No pleural effusion or pneumothorax. IMPRESSION: 1.  Endotracheal tube and NG tube in stable position. 2. Prior CABG. Cardiomegaly. Persistent bilateral interstitial prominence with improvement from prior exam. These could findings are consistent with improving CHF. Electronically Signed   By: Marcello Moores  Register   On: 12/15/2017 09:13     Baltazar Apo, MD, PhD 01/13/2018, 2:27 PM [facility] [phone] or if no answer [phone]

## 2018-08-14 IMAGING — DX DG CHEST 2V
2 series · 2 of 2 positions shown · non-contrast
Comparison: Chest x-rays dated 06/30/2017 in 06/28/2016.

CLINICAL DATA: Two week history of cough.

EXAM:
CHEST - 2 VIEW

[x chest ap]
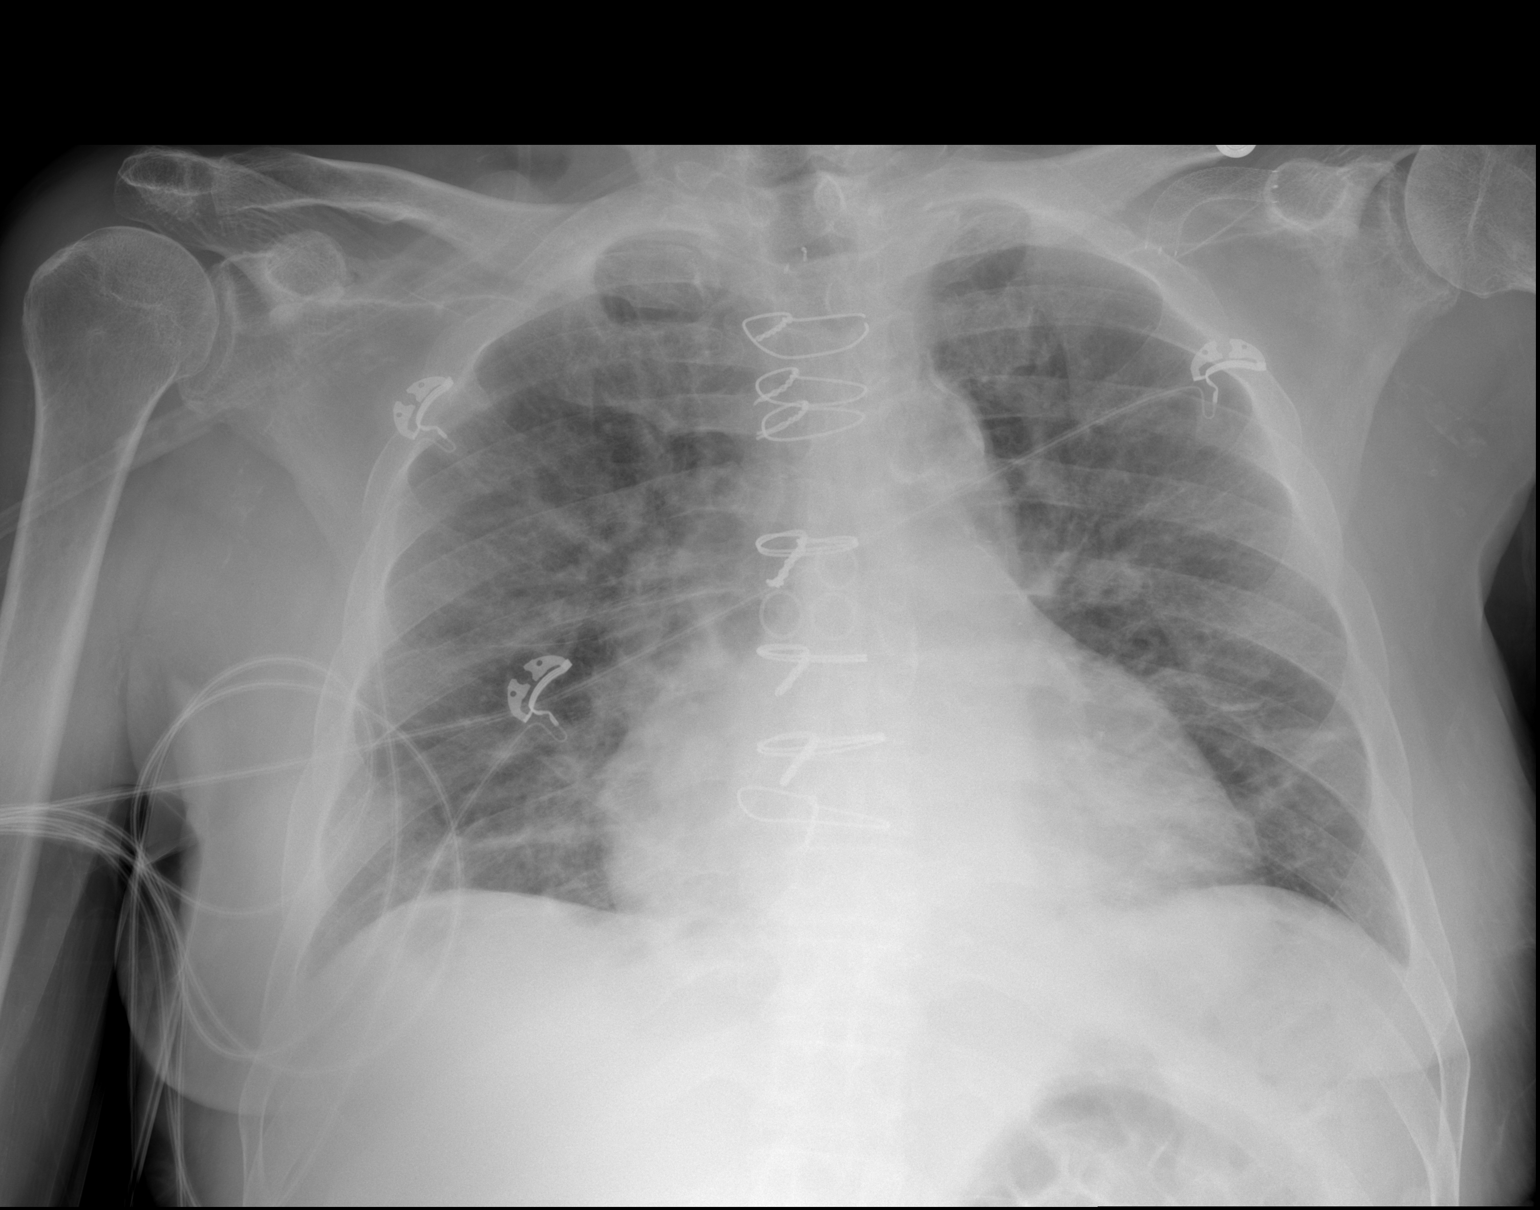

[w chest lat]
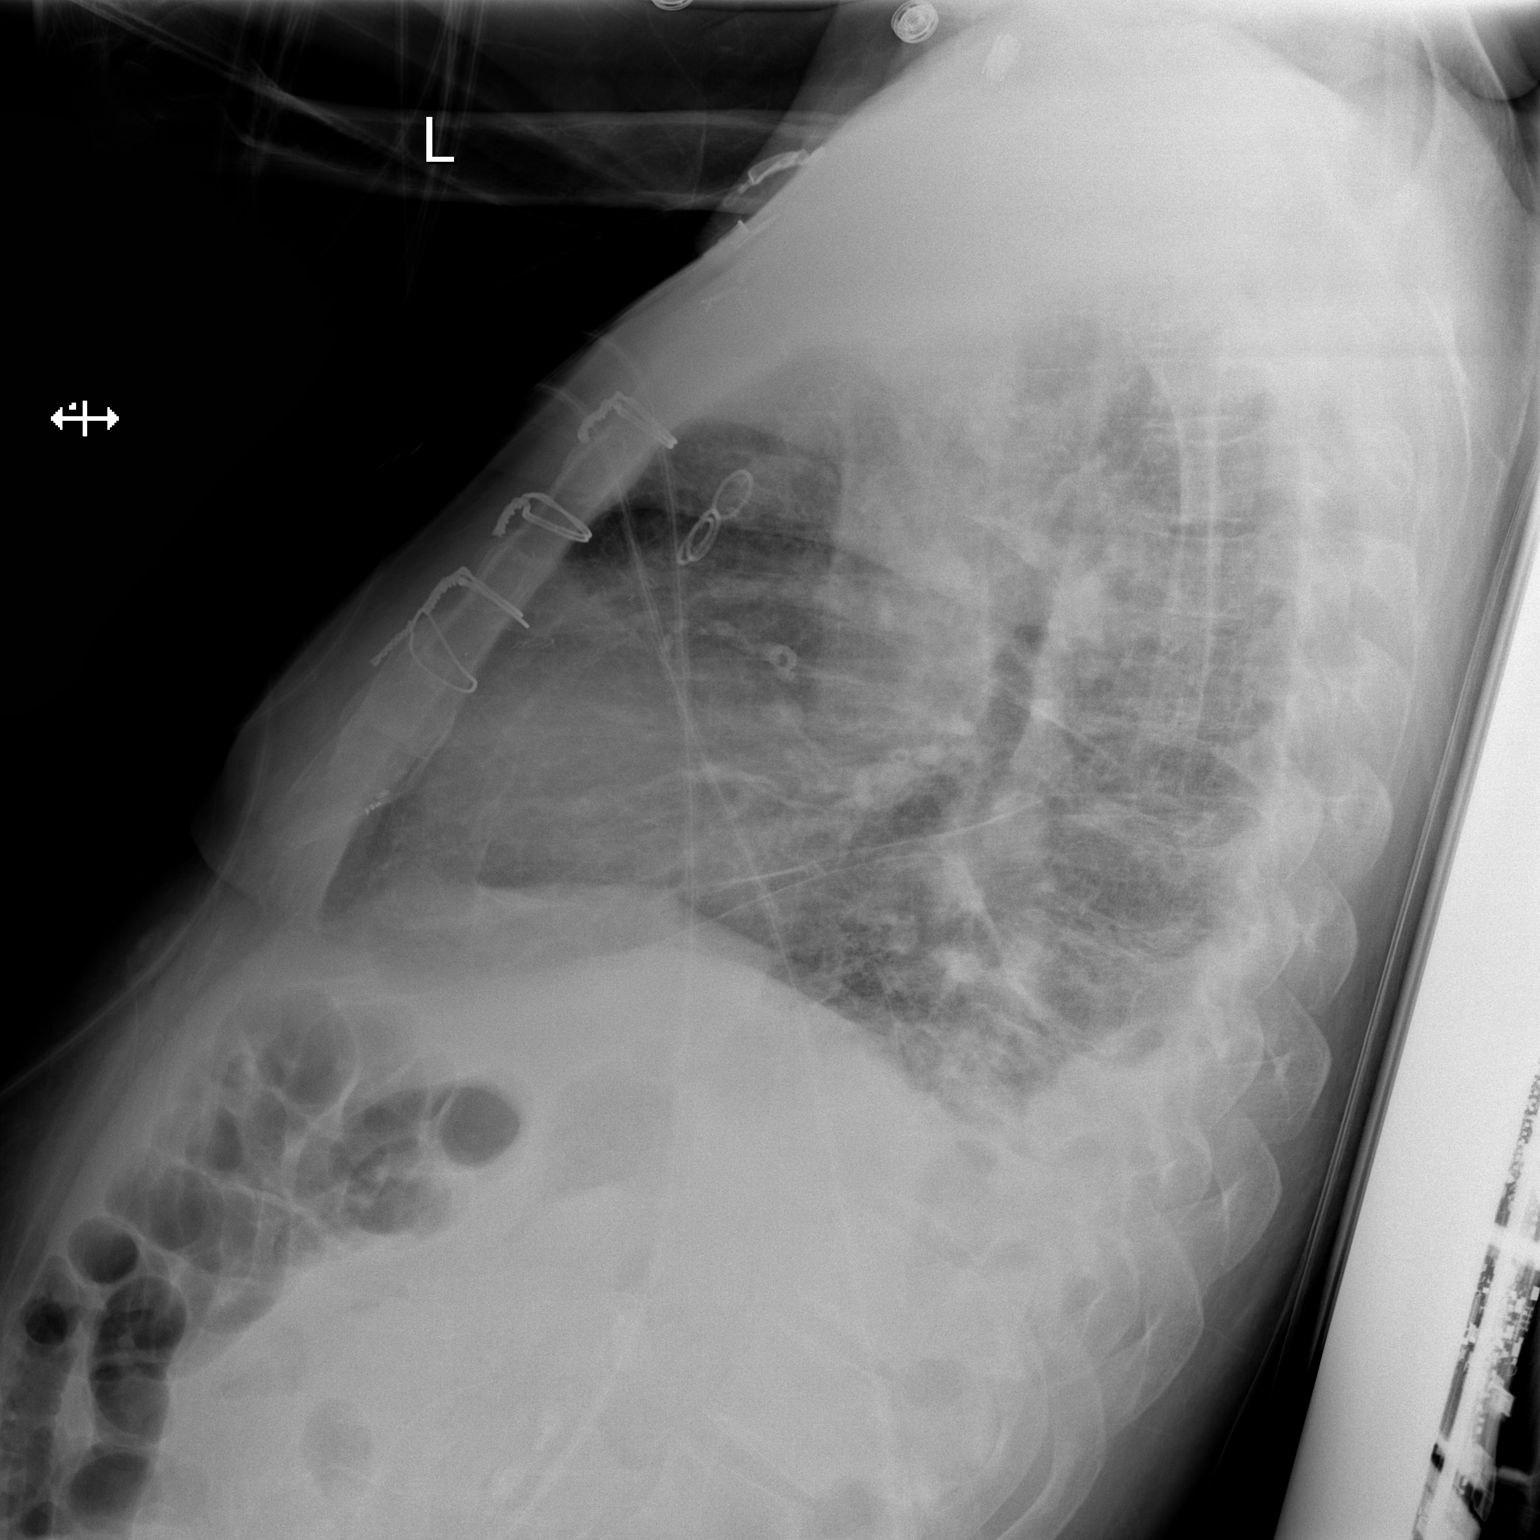

[2 of 2 positions shown; findings below may reference images not displayed]

FINDINGS: Stable cardiomegaly. Median sternotomy wires are stable in
alignment. Atherosclerotic changes again noted at the aortic arch.

Coarse interstitial markings again noted bilaterally suggesting some
degree of chronic interstitial lung disease, although increased
compared to previous studies suggesting superimposed edema. New
dense opacity at the posterior costophrenic angle, uncertain side as
it is only seen on the lateral view, pneumonia versus atelectasis.
No pleural effusion or pneumothorax seen.
IMPRESSION: 1. New dense opacity at the posterior costophrenic angle, pneumonia
versus atelectasis, suspicious for pneumonia given the history of
productive cough.
2. Probable bilateral interstitial edema, indicating mild CHF/volume
overload, superimposed on chronic interstitial lung disease.
3. Stable cardiomegaly.
4. Aortic atherosclerosis.

## 2018-08-14 IMAGING — DX DG ABDOMEN 1V
1 series · 1 of 1 positions shown · non-contrast
Comparison: 06/28/2016

CLINICAL DATA: Abdominal distension with nausea vomiting
constipation

EXAM:
ABDOMEN - 1 VIEW

[abdomen kub]
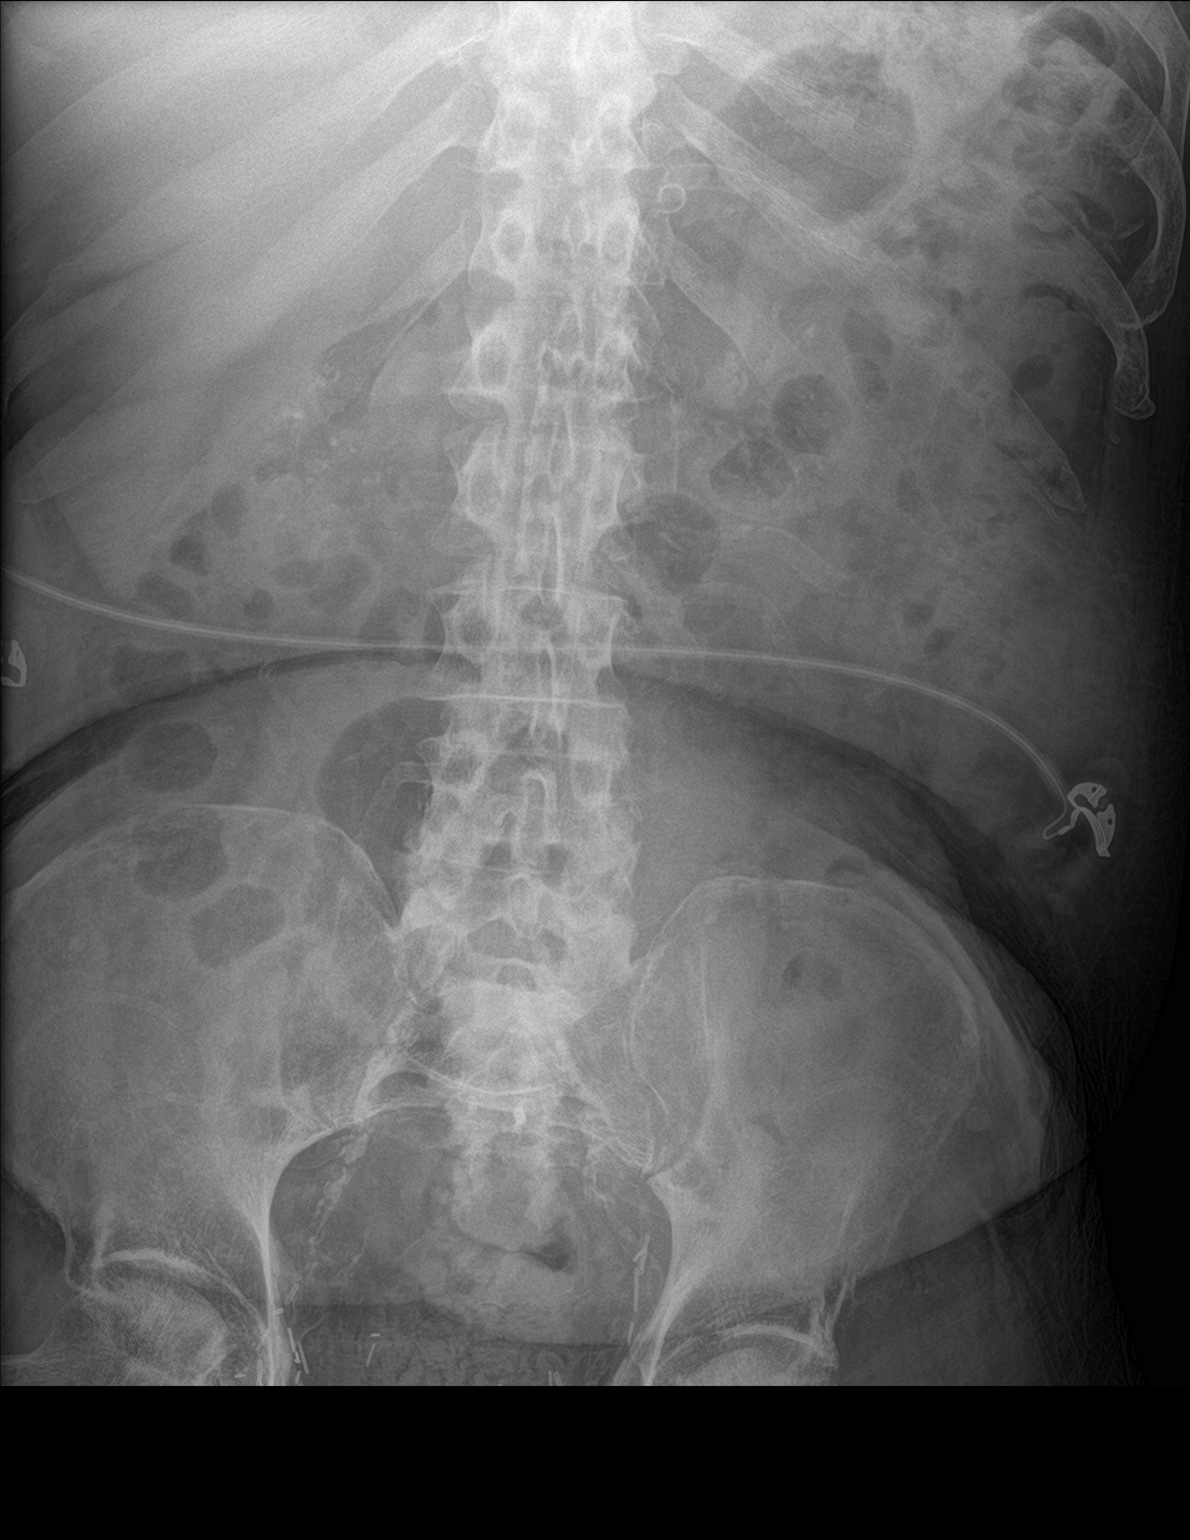

[1 of 1 positions shown; findings below may reference images not displayed]

FINDINGS: Nonobstructed gas pattern. Surgical clips in the pelvis. Vascular
calcifications. Possible AVN changes of both femoral heads with no
collapse.

Multiple small calcifications in the right upper quadrant, could
correspond to the history of gallstones
IMPRESSION: 1. Nonobstructed gas pattern
2. Multiple small right upper quadrant calcifications may correspond
to the history of gallstones
3. Possible AVN type changes of both femoral heads with no collapse
on limited view of the pelvis

## 2018-08-18 IMAGING — US US ABDOMEN LIMITED
1 series · 14 of 25 positions shown · non-contrast
Comparison: 06/29/2017.

CLINICAL DATA: Elevated LFTs.

EXAM:
ULTRASOUND ABDOMEN LIMITED RIGHT UPPER QUADRANT

[Series 1: us abdomen limited · 0.17mm/px · 14 of 49 slices shown]
[im 1/49]
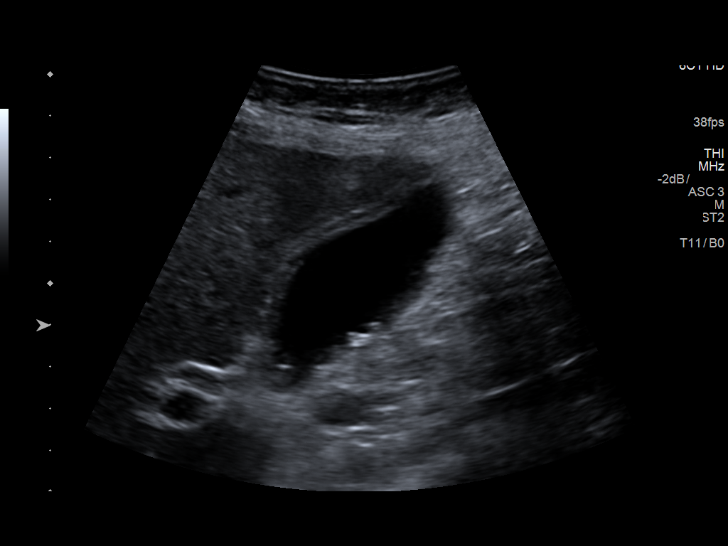
[im 5/49]
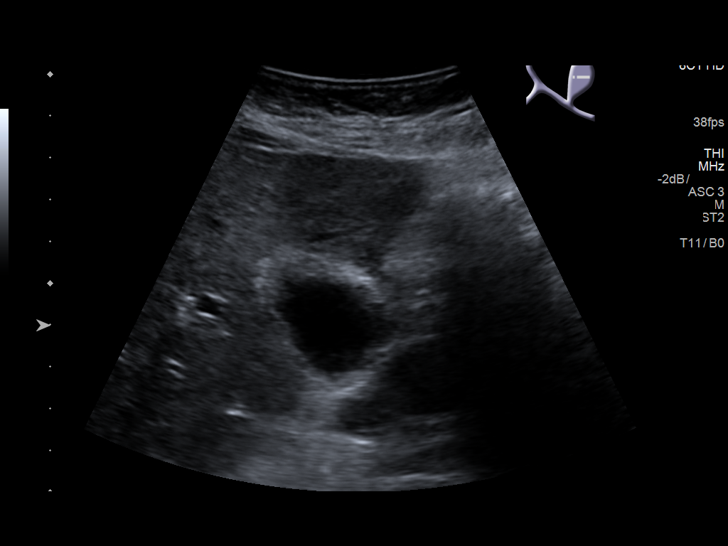
[im 9/49]
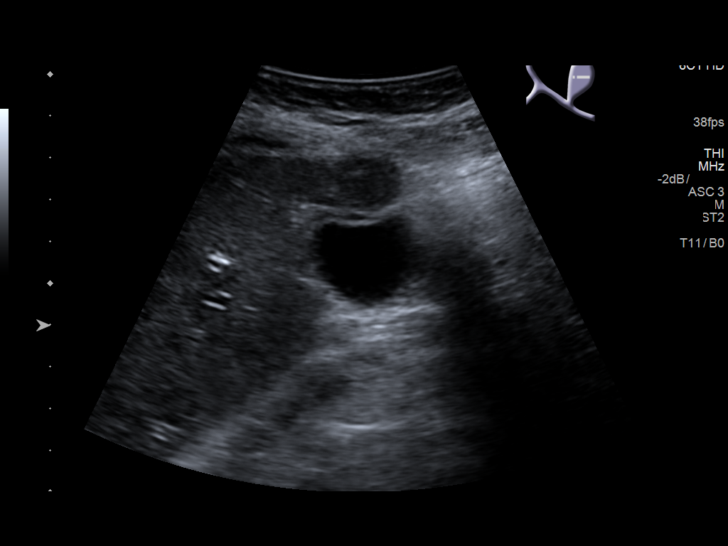
[im 13/49]
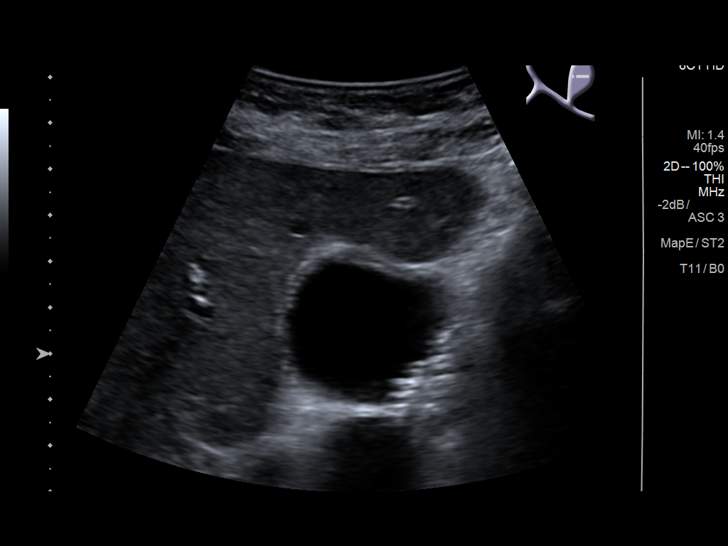
[im 17/49]
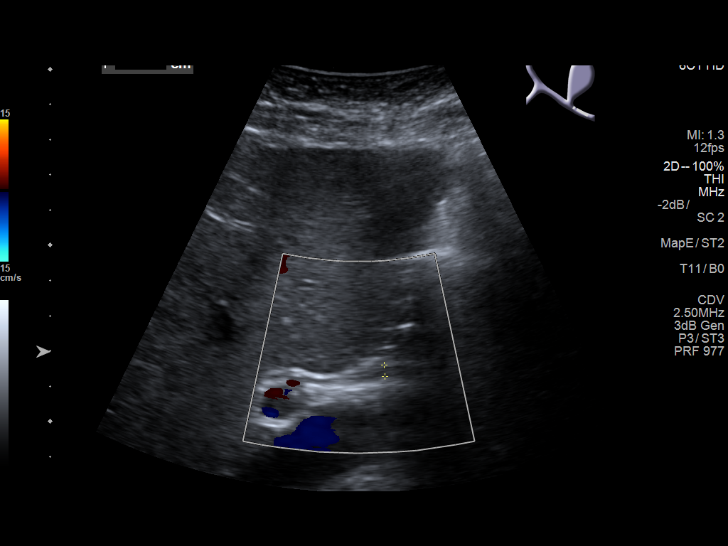
[im 19/49]
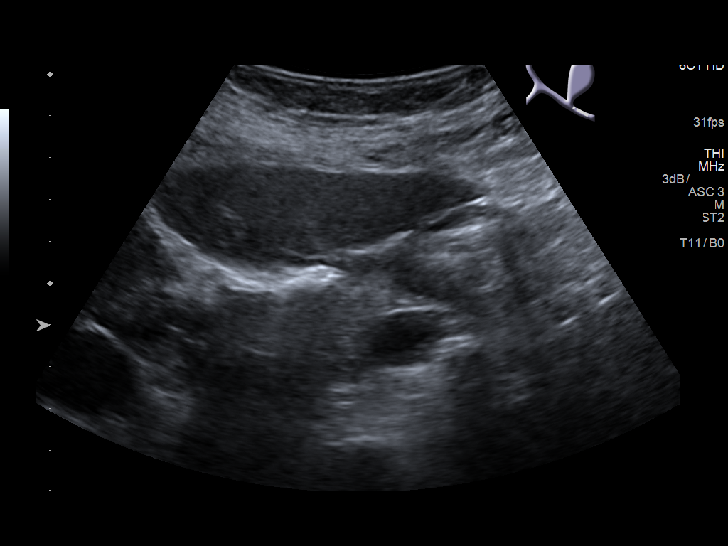
[im 23/49]
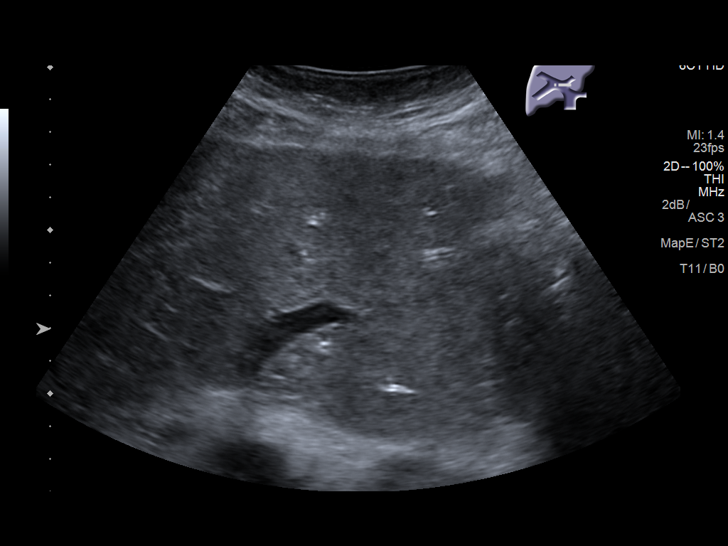
[im 27/49]
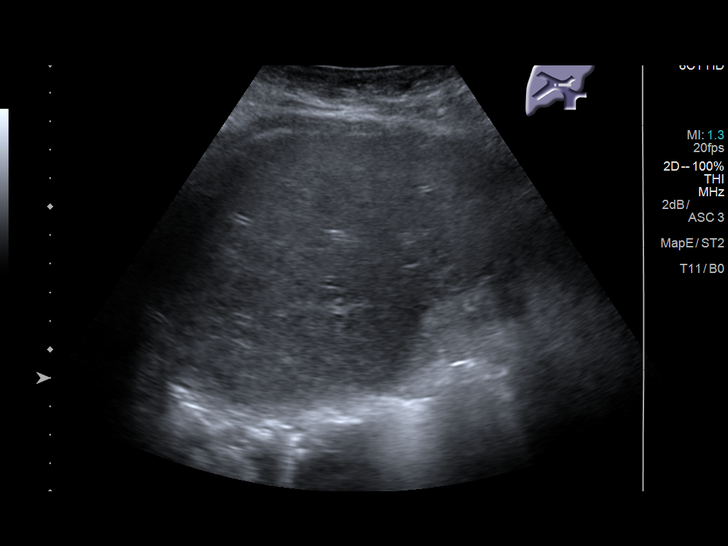
[im 31/49]
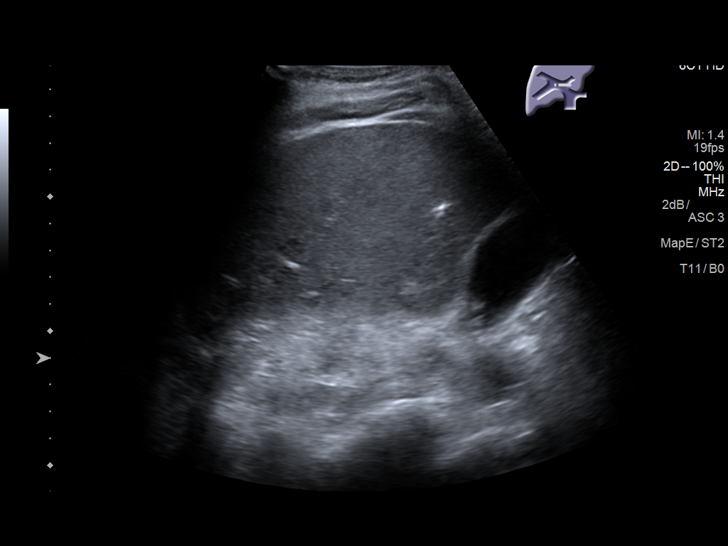
[im 33/49]
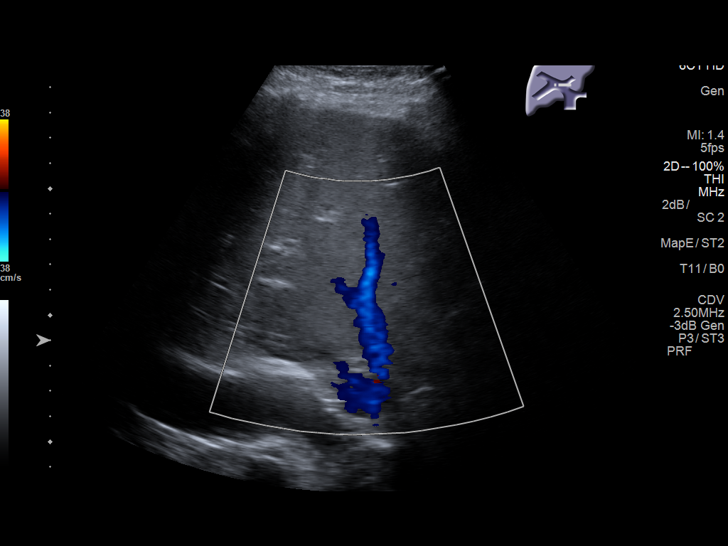
[im 37/49]
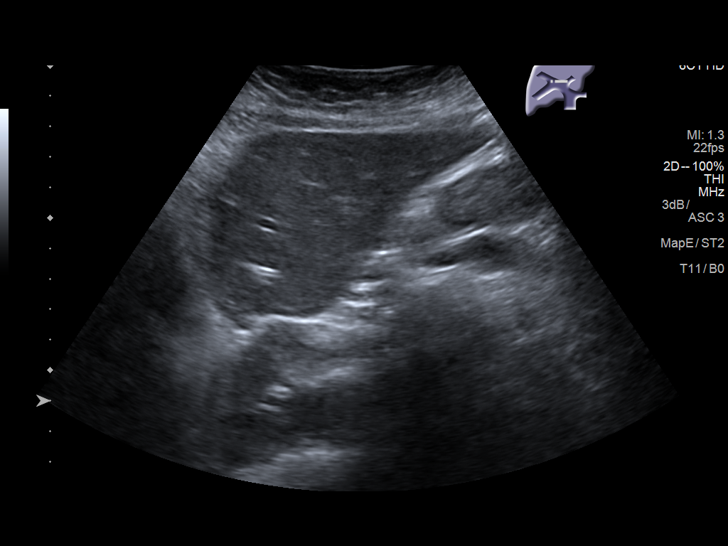
[im 41/49]
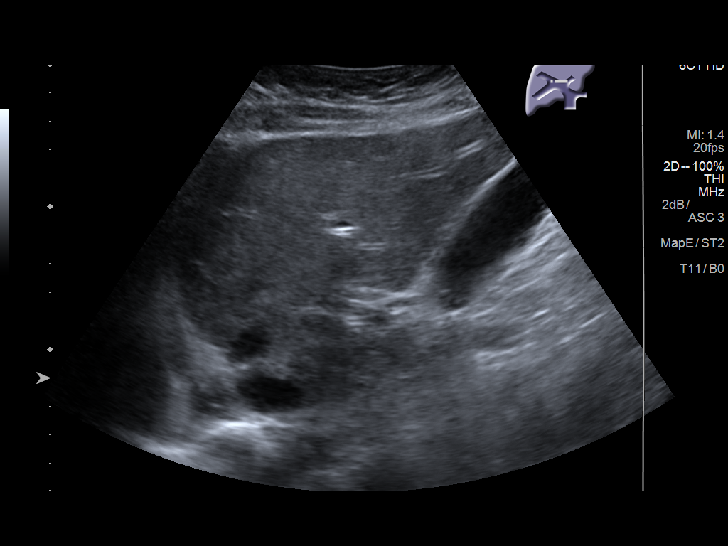
[im 45/49]
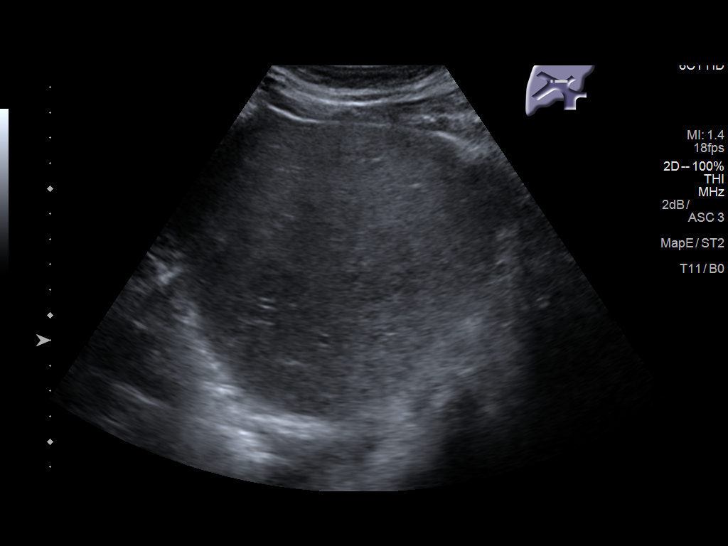
[im 49/49]
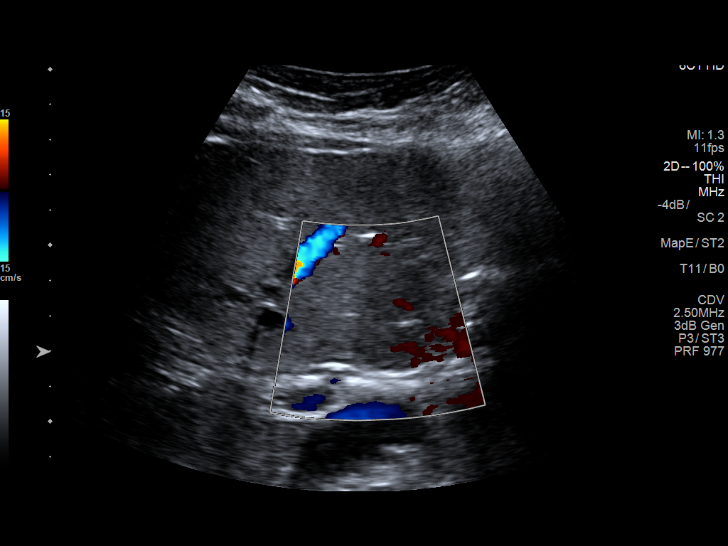

[14 of 25 positions shown; findings below may reference images not displayed]

FINDINGS: Gallbladder:

Small gallstones are present, with the largest measuring 6 mm in
single dimension. Gallbladder wall thickening, upper limits of
normal, mildly increased at 3.3 mm. No pericholecystic fluid.
Negative sonographic Murphy's sign.

Common bile duct:

Diameter: 3 mm, normal.

Liver:

No focal lesion identified. Within normal limits in parenchymal
echogenicity. Portal vein is patent on color Doppler imaging with
normal direction of blood flow towards the liver.
IMPRESSION: Cholelithiasis, without gallbladder wall tenderness, but with stable
mild gallbladder wall thickening of 3 mm.

No biliary ductal dilatation to suggest an obstructive cause of
LFTs.

If further investigation desired, consider nuclear medicine
hepatobiliary imaging.
